# Patient Record
Sex: Female | Born: 1935
Health system: Southern US, Community
[De-identification: ages and names within clinical notes are randomized; demographics above are authoritative.]

## PROBLEM LIST (undated history)

## (undated) DIAGNOSIS — K625 Hemorrhage of anus and rectum: Secondary | ICD-10-CM

## (undated) DIAGNOSIS — M1990 Unspecified osteoarthritis, unspecified site: Secondary | ICD-10-CM

## (undated) DIAGNOSIS — I739 Peripheral vascular disease, unspecified: Secondary | ICD-10-CM

## (undated) DIAGNOSIS — I080 Rheumatic disorders of both mitral and aortic valves: Secondary | ICD-10-CM

## (undated) DIAGNOSIS — K5731 Diverticulosis of large intestine without perforation or abscess with bleeding: Secondary | ICD-10-CM

## (undated) DIAGNOSIS — I214 Non-ST elevation (NSTEMI) myocardial infarction: Secondary | ICD-10-CM

## (undated) DIAGNOSIS — I5031 Acute diastolic (congestive) heart failure: Secondary | ICD-10-CM

## (undated) DIAGNOSIS — H3530 Unspecified macular degeneration: Secondary | ICD-10-CM

## (undated) DIAGNOSIS — I509 Heart failure, unspecified: Secondary | ICD-10-CM

## (undated) DIAGNOSIS — B029 Zoster without complications: Secondary | ICD-10-CM

## (undated) DIAGNOSIS — G459 Transient cerebral ischemic attack, unspecified: Secondary | ICD-10-CM

## (undated) DIAGNOSIS — N390 Urinary tract infection, site not specified: Secondary | ICD-10-CM

## (undated) DIAGNOSIS — R251 Tremor, unspecified: Secondary | ICD-10-CM

## (undated) DIAGNOSIS — I639 Cerebral infarction, unspecified: Secondary | ICD-10-CM

## (undated) DIAGNOSIS — F419 Anxiety disorder, unspecified: Secondary | ICD-10-CM

## (undated) DIAGNOSIS — I10 Essential (primary) hypertension: Secondary | ICD-10-CM

## (undated) DIAGNOSIS — I219 Acute myocardial infarction, unspecified: Secondary | ICD-10-CM

## (undated) DIAGNOSIS — N179 Acute kidney failure, unspecified: Secondary | ICD-10-CM

## (undated) DIAGNOSIS — E785 Hyperlipidemia, unspecified: Secondary | ICD-10-CM

## (undated) DIAGNOSIS — N189 Chronic kidney disease, unspecified: Secondary | ICD-10-CM

## (undated) DIAGNOSIS — E119 Type 2 diabetes mellitus without complications: Secondary | ICD-10-CM

## (undated) DIAGNOSIS — T7840XA Allergy, unspecified, initial encounter: Secondary | ICD-10-CM

## (undated) DIAGNOSIS — R011 Cardiac murmur, unspecified: Secondary | ICD-10-CM

## (undated) DIAGNOSIS — C801 Malignant (primary) neoplasm, unspecified: Secondary | ICD-10-CM

## (undated) DIAGNOSIS — I70229 Atherosclerosis of native arteries of extremities with rest pain, unspecified extremity: Secondary | ICD-10-CM

## (undated) HISTORY — PX: EYE SURGERY: SHX253

## (undated) HISTORY — DX: Acute kidney failure, unspecified: N18.9

## (undated) HISTORY — DX: Acute diastolic (congestive) heart failure: I50.31

## (undated) HISTORY — DX: Hyperlipidemia, unspecified: E78.5

## (undated) HISTORY — DX: Non-ST elevation (NSTEMI) myocardial infarction: I21.4

## (undated) HISTORY — PX: CATARACT EXTRACTION, BILATERAL: SHX1313

## (undated) HISTORY — DX: Heart failure, unspecified: I50.9

## (undated) HISTORY — DX: Acute kidney failure, unspecified: N17.9

## (undated) HISTORY — DX: Hemorrhage of anus and rectum: K62.5

## (undated) HISTORY — DX: Atherosclerosis of native arteries of extremities with rest pain, unspecified extremity: I70.229

## (undated) HISTORY — DX: Diverticulosis of large intestine without perforation or abscess with bleeding: K57.31

## (undated) HISTORY — DX: Unspecified osteoarthritis, unspecified site: M19.90

## (undated) HISTORY — PX: TUBAL LIGATION: SHX77

## (undated) HISTORY — DX: Chronic kidney disease, unspecified: N17.9

## (undated) HISTORY — DX: Tremor, unspecified: R25.1

---

## 1996-10-26 HISTORY — PX: CARDIAC CATHETERIZATION: SHX172

## 2003-10-27 LAB — HM COLONOSCOPY: HM Colonoscopy: NORMAL

## 2006-04-05 ENCOUNTER — Emergency Department: Payer: Self-pay | Admitting: Emergency Medicine

## 2008-04-14 ENCOUNTER — Ambulatory Visit: Payer: Self-pay | Admitting: Internal Medicine

## 2009-05-08 ENCOUNTER — Ambulatory Visit: Payer: Self-pay

## 2011-11-05 DIAGNOSIS — R42 Dizziness and giddiness: Secondary | ICD-10-CM | POA: Diagnosis not present

## 2011-11-05 DIAGNOSIS — I2510 Atherosclerotic heart disease of native coronary artery without angina pectoris: Secondary | ICD-10-CM | POA: Diagnosis not present

## 2011-11-05 DIAGNOSIS — I119 Hypertensive heart disease without heart failure: Secondary | ICD-10-CM | POA: Diagnosis not present

## 2011-11-05 DIAGNOSIS — E7800 Pure hypercholesterolemia, unspecified: Secondary | ICD-10-CM | POA: Diagnosis not present

## 2011-11-27 DIAGNOSIS — I10 Essential (primary) hypertension: Secondary | ICD-10-CM | POA: Diagnosis not present

## 2011-11-27 DIAGNOSIS — J40 Bronchitis, not specified as acute or chronic: Secondary | ICD-10-CM | POA: Diagnosis not present

## 2011-11-27 DIAGNOSIS — E119 Type 2 diabetes mellitus without complications: Secondary | ICD-10-CM | POA: Diagnosis not present

## 2012-01-01 DIAGNOSIS — E785 Hyperlipidemia, unspecified: Secondary | ICD-10-CM | POA: Diagnosis not present

## 2012-01-01 DIAGNOSIS — I10 Essential (primary) hypertension: Secondary | ICD-10-CM | POA: Diagnosis not present

## 2012-01-01 DIAGNOSIS — Z79899 Other long term (current) drug therapy: Secondary | ICD-10-CM | POA: Diagnosis not present

## 2012-01-01 DIAGNOSIS — E119 Type 2 diabetes mellitus without complications: Secondary | ICD-10-CM | POA: Diagnosis not present

## 2012-02-24 DIAGNOSIS — E119 Type 2 diabetes mellitus without complications: Secondary | ICD-10-CM | POA: Diagnosis not present

## 2012-02-24 DIAGNOSIS — D2370 Other benign neoplasm of skin of unspecified lower limb, including hip: Secondary | ICD-10-CM | POA: Diagnosis not present

## 2012-02-24 DIAGNOSIS — M79609 Pain in unspecified limb: Secondary | ICD-10-CM | POA: Diagnosis not present

## 2012-02-24 DIAGNOSIS — M2010 Hallux valgus (acquired), unspecified foot: Secondary | ICD-10-CM | POA: Diagnosis not present

## 2012-02-25 DIAGNOSIS — I359 Nonrheumatic aortic valve disorder, unspecified: Secondary | ICD-10-CM | POA: Diagnosis not present

## 2012-02-25 DIAGNOSIS — E119 Type 2 diabetes mellitus without complications: Secondary | ICD-10-CM | POA: Diagnosis not present

## 2012-02-25 DIAGNOSIS — R011 Cardiac murmur, unspecified: Secondary | ICD-10-CM | POA: Diagnosis not present

## 2012-03-03 DIAGNOSIS — I119 Hypertensive heart disease without heart failure: Secondary | ICD-10-CM | POA: Diagnosis not present

## 2012-03-03 DIAGNOSIS — I2510 Atherosclerotic heart disease of native coronary artery without angina pectoris: Secondary | ICD-10-CM | POA: Diagnosis not present

## 2012-03-03 DIAGNOSIS — E7800 Pure hypercholesterolemia, unspecified: Secondary | ICD-10-CM | POA: Diagnosis not present

## 2012-03-07 DIAGNOSIS — D2370 Other benign neoplasm of skin of unspecified lower limb, including hip: Secondary | ICD-10-CM | POA: Diagnosis not present

## 2012-03-07 DIAGNOSIS — M79609 Pain in unspecified limb: Secondary | ICD-10-CM | POA: Diagnosis not present

## 2012-03-23 DIAGNOSIS — E11319 Type 2 diabetes mellitus with unspecified diabetic retinopathy without macular edema: Secondary | ICD-10-CM | POA: Diagnosis not present

## 2012-03-23 DIAGNOSIS — H26499 Other secondary cataract, unspecified eye: Secondary | ICD-10-CM | POA: Diagnosis not present

## 2012-03-31 DIAGNOSIS — H26499 Other secondary cataract, unspecified eye: Secondary | ICD-10-CM | POA: Diagnosis not present

## 2012-04-20 DIAGNOSIS — A779 Spotted fever, unspecified: Secondary | ICD-10-CM | POA: Diagnosis not present

## 2012-04-20 DIAGNOSIS — E119 Type 2 diabetes mellitus without complications: Secondary | ICD-10-CM | POA: Diagnosis not present

## 2012-06-01 DIAGNOSIS — Z01419 Encounter for gynecological examination (general) (routine) without abnormal findings: Secondary | ICD-10-CM | POA: Diagnosis not present

## 2012-06-01 DIAGNOSIS — E119 Type 2 diabetes mellitus without complications: Secondary | ICD-10-CM | POA: Diagnosis not present

## 2012-06-01 DIAGNOSIS — I739 Peripheral vascular disease, unspecified: Secondary | ICD-10-CM | POA: Diagnosis not present

## 2012-06-13 DIAGNOSIS — I6529 Occlusion and stenosis of unspecified carotid artery: Secondary | ICD-10-CM | POA: Diagnosis not present

## 2012-06-13 DIAGNOSIS — I2510 Atherosclerotic heart disease of native coronary artery without angina pectoris: Secondary | ICD-10-CM | POA: Diagnosis not present

## 2012-06-13 DIAGNOSIS — I70219 Atherosclerosis of native arteries of extremities with intermittent claudication, unspecified extremity: Secondary | ICD-10-CM | POA: Diagnosis not present

## 2012-06-13 DIAGNOSIS — I10 Essential (primary) hypertension: Secondary | ICD-10-CM | POA: Diagnosis not present

## 2012-06-15 DIAGNOSIS — R0989 Other specified symptoms and signs involving the circulatory and respiratory systems: Secondary | ICD-10-CM | POA: Diagnosis not present

## 2012-06-15 DIAGNOSIS — I70219 Atherosclerosis of native arteries of extremities with intermittent claudication, unspecified extremity: Secondary | ICD-10-CM | POA: Diagnosis not present

## 2012-06-15 DIAGNOSIS — I10 Essential (primary) hypertension: Secondary | ICD-10-CM | POA: Diagnosis not present

## 2012-06-16 DIAGNOSIS — I6529 Occlusion and stenosis of unspecified carotid artery: Secondary | ICD-10-CM | POA: Diagnosis not present

## 2012-06-21 ENCOUNTER — Ambulatory Visit: Payer: Self-pay | Admitting: Vascular Surgery

## 2012-06-21 DIAGNOSIS — I70229 Atherosclerosis of native arteries of extremities with rest pain, unspecified extremity: Secondary | ICD-10-CM | POA: Diagnosis not present

## 2012-06-21 DIAGNOSIS — Z7982 Long term (current) use of aspirin: Secondary | ICD-10-CM | POA: Diagnosis not present

## 2012-06-21 DIAGNOSIS — I10 Essential (primary) hypertension: Secondary | ICD-10-CM | POA: Diagnosis not present

## 2012-06-21 DIAGNOSIS — Z79899 Other long term (current) drug therapy: Secondary | ICD-10-CM | POA: Diagnosis not present

## 2012-06-21 DIAGNOSIS — I6529 Occlusion and stenosis of unspecified carotid artery: Secondary | ICD-10-CM | POA: Diagnosis not present

## 2012-06-21 DIAGNOSIS — E119 Type 2 diabetes mellitus without complications: Secondary | ICD-10-CM | POA: Diagnosis not present

## 2012-06-21 DIAGNOSIS — E785 Hyperlipidemia, unspecified: Secondary | ICD-10-CM | POA: Diagnosis not present

## 2012-06-21 DIAGNOSIS — I2510 Atherosclerotic heart disease of native coronary artery without angina pectoris: Secondary | ICD-10-CM | POA: Diagnosis not present

## 2012-06-21 LAB — BASIC METABOLIC PANEL
Anion Gap: 7 (ref 7–16)
BUN: 19 mg/dL — ABNORMAL HIGH (ref 7–18)
Chloride: 107 mmol/L (ref 98–107)
Creatinine: 0.92 mg/dL (ref 0.60–1.30)
Osmolality: 288 (ref 275–301)

## 2012-06-24 DIAGNOSIS — M79609 Pain in unspecified limb: Secondary | ICD-10-CM | POA: Diagnosis not present

## 2012-06-24 DIAGNOSIS — I70219 Atherosclerosis of native arteries of extremities with intermittent claudication, unspecified extremity: Secondary | ICD-10-CM | POA: Diagnosis not present

## 2012-07-07 DIAGNOSIS — E785 Hyperlipidemia, unspecified: Secondary | ICD-10-CM | POA: Diagnosis not present

## 2012-07-07 DIAGNOSIS — I10 Essential (primary) hypertension: Secondary | ICD-10-CM | POA: Diagnosis not present

## 2012-07-07 DIAGNOSIS — I739 Peripheral vascular disease, unspecified: Secondary | ICD-10-CM | POA: Diagnosis not present

## 2012-07-07 DIAGNOSIS — I2510 Atherosclerotic heart disease of native coronary artery without angina pectoris: Secondary | ICD-10-CM | POA: Diagnosis not present

## 2012-07-18 DIAGNOSIS — I10 Essential (primary) hypertension: Secondary | ICD-10-CM | POA: Diagnosis not present

## 2012-07-18 DIAGNOSIS — I70229 Atherosclerosis of native arteries of extremities with rest pain, unspecified extremity: Secondary | ICD-10-CM | POA: Diagnosis not present

## 2012-07-18 DIAGNOSIS — E785 Hyperlipidemia, unspecified: Secondary | ICD-10-CM | POA: Diagnosis not present

## 2012-07-18 DIAGNOSIS — M79609 Pain in unspecified limb: Secondary | ICD-10-CM | POA: Diagnosis not present

## 2012-07-20 DIAGNOSIS — I2510 Atherosclerotic heart disease of native coronary artery without angina pectoris: Secondary | ICD-10-CM | POA: Diagnosis not present

## 2012-07-20 DIAGNOSIS — I119 Hypertensive heart disease without heart failure: Secondary | ICD-10-CM | POA: Diagnosis not present

## 2012-07-20 DIAGNOSIS — I739 Peripheral vascular disease, unspecified: Secondary | ICD-10-CM | POA: Diagnosis not present

## 2012-07-27 DIAGNOSIS — E119 Type 2 diabetes mellitus without complications: Secondary | ICD-10-CM | POA: Diagnosis not present

## 2012-07-27 DIAGNOSIS — Z79899 Other long term (current) drug therapy: Secondary | ICD-10-CM | POA: Diagnosis not present

## 2012-08-02 DIAGNOSIS — Z01419 Encounter for gynecological examination (general) (routine) without abnormal findings: Secondary | ICD-10-CM | POA: Diagnosis not present

## 2012-08-02 DIAGNOSIS — Z1231 Encounter for screening mammogram for malignant neoplasm of breast: Secondary | ICD-10-CM | POA: Diagnosis not present

## 2012-08-12 DIAGNOSIS — Z86010 Personal history of colonic polyps: Secondary | ICD-10-CM | POA: Diagnosis not present

## 2012-08-12 DIAGNOSIS — R198 Other specified symptoms and signs involving the digestive system and abdomen: Secondary | ICD-10-CM | POA: Diagnosis not present

## 2012-09-06 ENCOUNTER — Ambulatory Visit: Payer: Self-pay | Admitting: Vascular Surgery

## 2012-09-06 DIAGNOSIS — I70209 Unspecified atherosclerosis of native arteries of extremities, unspecified extremity: Secondary | ICD-10-CM | POA: Diagnosis not present

## 2012-09-06 DIAGNOSIS — Z01812 Encounter for preprocedural laboratory examination: Secondary | ICD-10-CM | POA: Diagnosis not present

## 2012-09-06 LAB — BASIC METABOLIC PANEL
BUN: 16 mg/dL (ref 7–18)
Calcium, Total: 9.2 mg/dL (ref 8.5–10.1)
Chloride: 106 mmol/L (ref 98–107)
EGFR (African American): 59 — ABNORMAL LOW
Glucose: 129 mg/dL — ABNORMAL HIGH (ref 65–99)
Potassium: 4.4 mmol/L (ref 3.5–5.1)
Sodium: 139 mmol/L (ref 136–145)

## 2012-09-06 LAB — CBC
HCT: 36.3 % (ref 35.0–47.0)
HGB: 12.4 g/dL (ref 12.0–16.0)
MCHC: 34.2 g/dL (ref 32.0–36.0)
Platelet: 250 10*3/uL (ref 150–440)
RBC: 4.03 10*6/uL (ref 3.80–5.20)
WBC: 7.6 10*3/uL (ref 3.6–11.0)

## 2012-09-14 ENCOUNTER — Inpatient Hospital Stay: Payer: Self-pay | Admitting: Vascular Surgery

## 2012-09-14 DIAGNOSIS — I70209 Unspecified atherosclerosis of native arteries of extremities, unspecified extremity: Secondary | ICD-10-CM | POA: Diagnosis not present

## 2012-09-14 DIAGNOSIS — E785 Hyperlipidemia, unspecified: Secondary | ICD-10-CM | POA: Diagnosis present

## 2012-09-14 DIAGNOSIS — I739 Peripheral vascular disease, unspecified: Secondary | ICD-10-CM | POA: Diagnosis not present

## 2012-09-14 DIAGNOSIS — L97509 Non-pressure chronic ulcer of other part of unspecified foot with unspecified severity: Secondary | ICD-10-CM | POA: Diagnosis present

## 2012-09-14 DIAGNOSIS — E7800 Pure hypercholesterolemia, unspecified: Secondary | ICD-10-CM | POA: Diagnosis present

## 2012-09-14 DIAGNOSIS — L98499 Non-pressure chronic ulcer of skin of other sites with unspecified severity: Secondary | ICD-10-CM | POA: Diagnosis not present

## 2012-09-14 DIAGNOSIS — G609 Hereditary and idiopathic neuropathy, unspecified: Secondary | ICD-10-CM | POA: Diagnosis present

## 2012-09-14 DIAGNOSIS — I6529 Occlusion and stenosis of unspecified carotid artery: Secondary | ICD-10-CM | POA: Diagnosis present

## 2012-09-14 DIAGNOSIS — I10 Essential (primary) hypertension: Secondary | ICD-10-CM | POA: Diagnosis present

## 2012-09-14 DIAGNOSIS — Z794 Long term (current) use of insulin: Secondary | ICD-10-CM | POA: Diagnosis not present

## 2012-09-14 DIAGNOSIS — Z7982 Long term (current) use of aspirin: Secondary | ICD-10-CM | POA: Diagnosis not present

## 2012-09-14 DIAGNOSIS — I2510 Atherosclerotic heart disease of native coronary artery without angina pectoris: Secondary | ICD-10-CM | POA: Diagnosis present

## 2012-09-14 DIAGNOSIS — E119 Type 2 diabetes mellitus without complications: Secondary | ICD-10-CM | POA: Diagnosis present

## 2012-09-15 LAB — CBC WITH DIFFERENTIAL/PLATELET
Basophil %: 0.1 %
Eosinophil #: 0 10*3/uL (ref 0.0–0.7)
Eosinophil %: 0 %
HCT: 29.9 % — ABNORMAL LOW (ref 35.0–47.0)
HGB: 10.2 g/dL — ABNORMAL LOW (ref 12.0–16.0)
Lymphocyte %: 7.5 %
MCHC: 34.1 g/dL (ref 32.0–36.0)
Monocyte #: 0.5 x10 3/mm (ref 0.2–0.9)
Monocyte %: 3.7 %
Neutrophil #: 10.8 10*3/uL — ABNORMAL HIGH (ref 1.4–6.5)
Neutrophil %: 88.7 %
RBC: 3.33 10*6/uL — ABNORMAL LOW (ref 3.80–5.20)
WBC: 12.1 10*3/uL — ABNORMAL HIGH (ref 3.6–11.0)

## 2012-09-15 LAB — BASIC METABOLIC PANEL
Calcium, Total: 7.9 mg/dL — ABNORMAL LOW (ref 8.5–10.1)
EGFR (African American): >60
EGFR (Non-African Amer.): >60
Glucose: 194 mg/dL — ABNORMAL HIGH (ref 65–99)
Osmolality: 283 (ref 275–301)
Sodium: 139 mmol/L (ref 136–145)

## 2012-10-05 DIAGNOSIS — E119 Type 2 diabetes mellitus without complications: Secondary | ICD-10-CM | POA: Diagnosis not present

## 2012-10-12 DIAGNOSIS — I129 Hypertensive chronic kidney disease with stage 1 through stage 4 chronic kidney disease, or unspecified chronic kidney disease: Secondary | ICD-10-CM | POA: Diagnosis not present

## 2012-10-12 DIAGNOSIS — I70219 Atherosclerosis of native arteries of extremities with intermittent claudication, unspecified extremity: Secondary | ICD-10-CM | POA: Diagnosis not present

## 2012-10-12 DIAGNOSIS — E785 Hyperlipidemia, unspecified: Secondary | ICD-10-CM | POA: Diagnosis not present

## 2012-10-12 DIAGNOSIS — I6529 Occlusion and stenosis of unspecified carotid artery: Secondary | ICD-10-CM | POA: Diagnosis not present

## 2012-11-08 DIAGNOSIS — E7800 Pure hypercholesterolemia, unspecified: Secondary | ICD-10-CM | POA: Diagnosis not present

## 2012-11-08 DIAGNOSIS — I739 Peripheral vascular disease, unspecified: Secondary | ICD-10-CM | POA: Diagnosis not present

## 2012-11-08 DIAGNOSIS — I2510 Atherosclerotic heart disease of native coronary artery without angina pectoris: Secondary | ICD-10-CM | POA: Diagnosis not present

## 2012-11-16 DIAGNOSIS — E1039 Type 1 diabetes mellitus with other diabetic ophthalmic complication: Secondary | ICD-10-CM | POA: Diagnosis not present

## 2012-11-16 DIAGNOSIS — Z961 Presence of intraocular lens: Secondary | ICD-10-CM | POA: Diagnosis not present

## 2012-11-16 DIAGNOSIS — E11319 Type 2 diabetes mellitus with unspecified diabetic retinopathy without macular edema: Secondary | ICD-10-CM | POA: Diagnosis not present

## 2012-12-19 DIAGNOSIS — I2510 Atherosclerotic heart disease of native coronary artery without angina pectoris: Secondary | ICD-10-CM | POA: Diagnosis not present

## 2012-12-19 DIAGNOSIS — M79609 Pain in unspecified limb: Secondary | ICD-10-CM | POA: Diagnosis not present

## 2012-12-19 DIAGNOSIS — I6529 Occlusion and stenosis of unspecified carotid artery: Secondary | ICD-10-CM | POA: Diagnosis not present

## 2012-12-23 DIAGNOSIS — E119 Type 2 diabetes mellitus without complications: Secondary | ICD-10-CM | POA: Diagnosis not present

## 2012-12-23 DIAGNOSIS — I70219 Atherosclerosis of native arteries of extremities with intermittent claudication, unspecified extremity: Secondary | ICD-10-CM | POA: Diagnosis not present

## 2012-12-23 DIAGNOSIS — I10 Essential (primary) hypertension: Secondary | ICD-10-CM | POA: Diagnosis not present

## 2012-12-23 DIAGNOSIS — I6529 Occlusion and stenosis of unspecified carotid artery: Secondary | ICD-10-CM | POA: Diagnosis not present

## 2012-12-24 HISTORY — PX: PTCA: SHX146

## 2012-12-28 ENCOUNTER — Ambulatory Visit: Payer: Self-pay | Admitting: Vascular Surgery

## 2012-12-28 DIAGNOSIS — I6529 Occlusion and stenosis of unspecified carotid artery: Secondary | ICD-10-CM | POA: Diagnosis not present

## 2012-12-28 DIAGNOSIS — Z7982 Long term (current) use of aspirin: Secondary | ICD-10-CM | POA: Diagnosis not present

## 2012-12-28 DIAGNOSIS — E785 Hyperlipidemia, unspecified: Secondary | ICD-10-CM | POA: Diagnosis not present

## 2012-12-28 DIAGNOSIS — Z79899 Other long term (current) drug therapy: Secondary | ICD-10-CM | POA: Diagnosis not present

## 2012-12-28 DIAGNOSIS — I10 Essential (primary) hypertension: Secondary | ICD-10-CM | POA: Diagnosis not present

## 2012-12-28 DIAGNOSIS — M79609 Pain in unspecified limb: Secondary | ICD-10-CM | POA: Diagnosis not present

## 2012-12-28 DIAGNOSIS — E119 Type 2 diabetes mellitus without complications: Secondary | ICD-10-CM | POA: Diagnosis not present

## 2012-12-28 DIAGNOSIS — I70229 Atherosclerosis of native arteries of extremities with rest pain, unspecified extremity: Secondary | ICD-10-CM | POA: Diagnosis not present

## 2012-12-28 DIAGNOSIS — I2510 Atherosclerotic heart disease of native coronary artery without angina pectoris: Secondary | ICD-10-CM | POA: Diagnosis not present

## 2012-12-28 LAB — BASIC METABOLIC PANEL
BUN: 21 mg/dL — ABNORMAL HIGH (ref 7–18)
Calcium, Total: 8.9 mg/dL (ref 8.5–10.1)
Chloride: 107 mmol/L (ref 98–107)
Creatinine: 0.96 mg/dL (ref 0.60–1.30)
EGFR (Non-African Amer.): 57 — ABNORMAL LOW
Osmolality: 285 (ref 275–301)
Potassium: 4.5 mmol/L (ref 3.5–5.1)
Sodium: 138 mmol/L (ref 136–145)

## 2013-01-11 DIAGNOSIS — I739 Peripheral vascular disease, unspecified: Secondary | ICD-10-CM | POA: Diagnosis not present

## 2013-01-11 DIAGNOSIS — I2510 Atherosclerotic heart disease of native coronary artery without angina pectoris: Secondary | ICD-10-CM | POA: Diagnosis not present

## 2013-01-11 DIAGNOSIS — E785 Hyperlipidemia, unspecified: Secondary | ICD-10-CM | POA: Diagnosis not present

## 2013-01-11 DIAGNOSIS — I10 Essential (primary) hypertension: Secondary | ICD-10-CM | POA: Diagnosis not present

## 2013-01-18 DIAGNOSIS — E119 Type 2 diabetes mellitus without complications: Secondary | ICD-10-CM | POA: Diagnosis not present

## 2013-01-18 DIAGNOSIS — I6529 Occlusion and stenosis of unspecified carotid artery: Secondary | ICD-10-CM | POA: Diagnosis not present

## 2013-01-18 DIAGNOSIS — I70219 Atherosclerosis of native arteries of extremities with intermittent claudication, unspecified extremity: Secondary | ICD-10-CM | POA: Diagnosis not present

## 2013-01-18 DIAGNOSIS — I739 Peripheral vascular disease, unspecified: Secondary | ICD-10-CM | POA: Diagnosis not present

## 2013-01-23 DIAGNOSIS — E785 Hyperlipidemia, unspecified: Secondary | ICD-10-CM | POA: Diagnosis not present

## 2013-01-23 DIAGNOSIS — E1142 Type 2 diabetes mellitus with diabetic polyneuropathy: Secondary | ICD-10-CM | POA: Diagnosis not present

## 2013-01-23 DIAGNOSIS — E1139 Type 2 diabetes mellitus with other diabetic ophthalmic complication: Secondary | ICD-10-CM | POA: Diagnosis not present

## 2013-01-23 DIAGNOSIS — Z79899 Other long term (current) drug therapy: Secondary | ICD-10-CM | POA: Diagnosis not present

## 2013-01-23 DIAGNOSIS — E1149 Type 2 diabetes mellitus with other diabetic neurological complication: Secondary | ICD-10-CM | POA: Diagnosis not present

## 2013-02-03 DIAGNOSIS — E1149 Type 2 diabetes mellitus with other diabetic neurological complication: Secondary | ICD-10-CM | POA: Diagnosis not present

## 2013-02-03 DIAGNOSIS — E1142 Type 2 diabetes mellitus with diabetic polyneuropathy: Secondary | ICD-10-CM | POA: Diagnosis not present

## 2013-02-09 DIAGNOSIS — I739 Peripheral vascular disease, unspecified: Secondary | ICD-10-CM | POA: Diagnosis not present

## 2013-02-09 DIAGNOSIS — E7800 Pure hypercholesterolemia, unspecified: Secondary | ICD-10-CM | POA: Diagnosis not present

## 2013-02-09 DIAGNOSIS — I2510 Atherosclerotic heart disease of native coronary artery without angina pectoris: Secondary | ICD-10-CM | POA: Diagnosis not present

## 2013-02-09 DIAGNOSIS — I10 Essential (primary) hypertension: Secondary | ICD-10-CM | POA: Diagnosis not present

## 2013-04-05 DIAGNOSIS — I70219 Atherosclerosis of native arteries of extremities with intermittent claudication, unspecified extremity: Secondary | ICD-10-CM | POA: Diagnosis not present

## 2013-04-05 DIAGNOSIS — I10 Essential (primary) hypertension: Secondary | ICD-10-CM | POA: Diagnosis not present

## 2013-04-05 DIAGNOSIS — E119 Type 2 diabetes mellitus without complications: Secondary | ICD-10-CM | POA: Diagnosis not present

## 2013-04-05 DIAGNOSIS — I6529 Occlusion and stenosis of unspecified carotid artery: Secondary | ICD-10-CM | POA: Diagnosis not present

## 2013-04-10 DIAGNOSIS — I2510 Atherosclerotic heart disease of native coronary artery without angina pectoris: Secondary | ICD-10-CM | POA: Diagnosis not present

## 2013-04-10 DIAGNOSIS — I739 Peripheral vascular disease, unspecified: Secondary | ICD-10-CM | POA: Diagnosis not present

## 2013-04-10 DIAGNOSIS — I119 Hypertensive heart disease without heart failure: Secondary | ICD-10-CM | POA: Diagnosis not present

## 2013-04-10 DIAGNOSIS — E7800 Pure hypercholesterolemia, unspecified: Secondary | ICD-10-CM | POA: Diagnosis not present

## 2013-05-03 ENCOUNTER — Emergency Department: Payer: Self-pay | Admitting: Unknown Physician Specialty

## 2013-05-03 ENCOUNTER — Ambulatory Visit: Payer: Self-pay | Admitting: Family Medicine

## 2013-05-03 DIAGNOSIS — R079 Chest pain, unspecified: Secondary | ICD-10-CM | POA: Diagnosis not present

## 2013-05-03 DIAGNOSIS — I6529 Occlusion and stenosis of unspecified carotid artery: Secondary | ICD-10-CM | POA: Diagnosis not present

## 2013-05-03 DIAGNOSIS — Z79899 Other long term (current) drug therapy: Secondary | ICD-10-CM | POA: Diagnosis not present

## 2013-05-03 DIAGNOSIS — R6889 Other general symptoms and signs: Secondary | ICD-10-CM | POA: Diagnosis not present

## 2013-05-03 DIAGNOSIS — E119 Type 2 diabetes mellitus without complications: Secondary | ICD-10-CM | POA: Diagnosis not present

## 2013-05-03 DIAGNOSIS — I519 Heart disease, unspecified: Secondary | ICD-10-CM | POA: Diagnosis not present

## 2013-05-03 DIAGNOSIS — E785 Hyperlipidemia, unspecified: Secondary | ICD-10-CM | POA: Diagnosis not present

## 2013-05-03 DIAGNOSIS — I10 Essential (primary) hypertension: Secondary | ICD-10-CM | POA: Diagnosis not present

## 2013-05-03 LAB — COMPREHENSIVE METABOLIC PANEL
Albumin: 3.7 g/dL (ref 3.4–5.0)
Alkaline Phosphatase: 69 U/L (ref 50–136)
Anion Gap: 4 — ABNORMAL LOW (ref 7–16)
Chloride: 106 mmol/L (ref 98–107)
Co2: 26 mmol/L (ref 21–32)
Creatinine: 0.92 mg/dL (ref 0.60–1.30)
EGFR (African American): >60
EGFR (Non-African Amer.): >60
Glucose: 68 mg/dL (ref 65–99)
Osmolality: 271 (ref 275–301)
Potassium: 4.3 mmol/L (ref 3.5–5.1)
SGPT (ALT): 20 U/L (ref 12–78)
Sodium: 136 mmol/L (ref 136–145)
Total Protein: 6.7 g/dL (ref 6.4–8.2)

## 2013-05-03 LAB — APTT: Activated PTT: 31.3 secs (ref 23.6–35.9)

## 2013-05-03 LAB — CBC
HCT: 33.2 % — ABNORMAL LOW (ref 35.0–47.0)
HGB: 11.3 g/dL — ABNORMAL LOW (ref 12.0–16.0)
MCHC: 34.1 g/dL (ref 32.0–36.0)
MCV: 88 fL (ref 80–100)
Platelet: 231 10*3/uL (ref 150–440)
RBC: 3.78 10*6/uL — ABNORMAL LOW (ref 3.80–5.20)
RDW: 13.1 % (ref 11.5–14.5)

## 2013-05-03 LAB — PROTIME-INR
INR: 1
Prothrombin Time: 13.3 secs (ref 11.5–14.7)

## 2013-05-03 LAB — MAGNESIUM: Magnesium: 1.9 mg/dL

## 2013-05-03 LAB — CK TOTAL AND CKMB (NOT AT ARMC): CK, Total: 63 U/L (ref 21–215)

## 2013-05-29 DIAGNOSIS — I119 Hypertensive heart disease without heart failure: Secondary | ICD-10-CM | POA: Insufficient documentation

## 2013-05-29 DIAGNOSIS — I2510 Atherosclerotic heart disease of native coronary artery without angina pectoris: Secondary | ICD-10-CM | POA: Insufficient documentation

## 2013-06-06 DIAGNOSIS — E1142 Type 2 diabetes mellitus with diabetic polyneuropathy: Secondary | ICD-10-CM | POA: Diagnosis not present

## 2013-06-06 DIAGNOSIS — I10 Essential (primary) hypertension: Secondary | ICD-10-CM | POA: Diagnosis not present

## 2013-06-06 DIAGNOSIS — E785 Hyperlipidemia, unspecified: Secondary | ICD-10-CM | POA: Diagnosis not present

## 2013-06-06 DIAGNOSIS — E1149 Type 2 diabetes mellitus with other diabetic neurological complication: Secondary | ICD-10-CM | POA: Diagnosis not present

## 2013-06-07 DIAGNOSIS — E785 Hyperlipidemia, unspecified: Secondary | ICD-10-CM | POA: Diagnosis not present

## 2013-06-07 DIAGNOSIS — E1149 Type 2 diabetes mellitus with other diabetic neurological complication: Secondary | ICD-10-CM | POA: Diagnosis not present

## 2013-06-20 DIAGNOSIS — E785 Hyperlipidemia, unspecified: Secondary | ICD-10-CM | POA: Diagnosis not present

## 2013-06-20 DIAGNOSIS — I739 Peripheral vascular disease, unspecified: Secondary | ICD-10-CM | POA: Diagnosis not present

## 2013-06-20 DIAGNOSIS — I10 Essential (primary) hypertension: Secondary | ICD-10-CM | POA: Diagnosis not present

## 2013-06-20 DIAGNOSIS — I2510 Atherosclerotic heart disease of native coronary artery without angina pectoris: Secondary | ICD-10-CM | POA: Diagnosis not present

## 2013-07-24 DIAGNOSIS — I70219 Atherosclerosis of native arteries of extremities with intermittent claudication, unspecified extremity: Secondary | ICD-10-CM | POA: Diagnosis not present

## 2013-07-24 DIAGNOSIS — I10 Essential (primary) hypertension: Secondary | ICD-10-CM | POA: Diagnosis not present

## 2013-07-24 DIAGNOSIS — M1990 Unspecified osteoarthritis, unspecified site: Secondary | ICD-10-CM | POA: Diagnosis not present

## 2013-07-24 DIAGNOSIS — M79609 Pain in unspecified limb: Secondary | ICD-10-CM | POA: Diagnosis not present

## 2013-07-26 ENCOUNTER — Ambulatory Visit: Payer: Self-pay | Admitting: Vascular Surgery

## 2013-07-26 DIAGNOSIS — E785 Hyperlipidemia, unspecified: Secondary | ICD-10-CM | POA: Diagnosis not present

## 2013-07-26 DIAGNOSIS — Z9889 Other specified postprocedural states: Secondary | ICD-10-CM | POA: Diagnosis not present

## 2013-07-26 DIAGNOSIS — I2510 Atherosclerotic heart disease of native coronary artery without angina pectoris: Secondary | ICD-10-CM | POA: Diagnosis not present

## 2013-07-26 DIAGNOSIS — I10 Essential (primary) hypertension: Secondary | ICD-10-CM | POA: Diagnosis not present

## 2013-07-26 DIAGNOSIS — I6529 Occlusion and stenosis of unspecified carotid artery: Secondary | ICD-10-CM | POA: Diagnosis not present

## 2013-07-26 DIAGNOSIS — Z7982 Long term (current) use of aspirin: Secondary | ICD-10-CM | POA: Diagnosis not present

## 2013-07-26 DIAGNOSIS — Z79899 Other long term (current) drug therapy: Secondary | ICD-10-CM | POA: Diagnosis not present

## 2013-07-26 DIAGNOSIS — E119 Type 2 diabetes mellitus without complications: Secondary | ICD-10-CM | POA: Diagnosis not present

## 2013-07-26 DIAGNOSIS — Z794 Long term (current) use of insulin: Secondary | ICD-10-CM | POA: Diagnosis not present

## 2013-07-26 DIAGNOSIS — I70229 Atherosclerosis of native arteries of extremities with rest pain, unspecified extremity: Secondary | ICD-10-CM | POA: Diagnosis not present

## 2013-07-26 LAB — BASIC METABOLIC PANEL
Anion Gap: 5 — ABNORMAL LOW (ref 7–16)
Calcium, Total: 9.2 mg/dL (ref 8.5–10.1)
Co2: 26 mmol/L (ref 21–32)
EGFR (African American): 55 — ABNORMAL LOW
EGFR (Non-African Amer.): 47 — ABNORMAL LOW
Glucose: 225 mg/dL — ABNORMAL HIGH (ref 65–99)
Osmolality: 283 (ref 275–301)
Potassium: 4.7 mmol/L (ref 3.5–5.1)
Sodium: 137 mmol/L (ref 136–145)

## 2013-08-03 DIAGNOSIS — Z1231 Encounter for screening mammogram for malignant neoplasm of breast: Secondary | ICD-10-CM | POA: Diagnosis not present

## 2013-08-09 DIAGNOSIS — I2510 Atherosclerotic heart disease of native coronary artery without angina pectoris: Secondary | ICD-10-CM | POA: Diagnosis not present

## 2013-08-09 DIAGNOSIS — I70219 Atherosclerosis of native arteries of extremities with intermittent claudication, unspecified extremity: Secondary | ICD-10-CM | POA: Diagnosis not present

## 2013-08-09 DIAGNOSIS — E119 Type 2 diabetes mellitus without complications: Secondary | ICD-10-CM | POA: Diagnosis not present

## 2013-08-09 DIAGNOSIS — I6529 Occlusion and stenosis of unspecified carotid artery: Secondary | ICD-10-CM | POA: Diagnosis not present

## 2013-08-16 DIAGNOSIS — Z23 Encounter for immunization: Secondary | ICD-10-CM | POA: Diagnosis not present

## 2013-08-16 DIAGNOSIS — E785 Hyperlipidemia, unspecified: Secondary | ICD-10-CM | POA: Diagnosis not present

## 2013-08-16 DIAGNOSIS — I2510 Atherosclerotic heart disease of native coronary artery without angina pectoris: Secondary | ICD-10-CM | POA: Diagnosis not present

## 2013-08-16 DIAGNOSIS — Z01810 Encounter for preprocedural cardiovascular examination: Secondary | ICD-10-CM | POA: Diagnosis not present

## 2013-08-16 DIAGNOSIS — I10 Essential (primary) hypertension: Secondary | ICD-10-CM | POA: Diagnosis not present

## 2013-08-16 DIAGNOSIS — I739 Peripheral vascular disease, unspecified: Secondary | ICD-10-CM | POA: Diagnosis not present

## 2013-08-26 HISTORY — PX: PTCA: SHX146

## 2013-08-31 ENCOUNTER — Ambulatory Visit: Payer: Self-pay | Admitting: Vascular Surgery

## 2013-08-31 DIAGNOSIS — E119 Type 2 diabetes mellitus without complications: Secondary | ICD-10-CM | POA: Diagnosis not present

## 2013-08-31 DIAGNOSIS — Z7982 Long term (current) use of aspirin: Secondary | ICD-10-CM | POA: Diagnosis not present

## 2013-08-31 DIAGNOSIS — I70229 Atherosclerosis of native arteries of extremities with rest pain, unspecified extremity: Secondary | ICD-10-CM | POA: Diagnosis not present

## 2013-08-31 DIAGNOSIS — Z01812 Encounter for preprocedural laboratory examination: Secondary | ICD-10-CM | POA: Diagnosis not present

## 2013-08-31 DIAGNOSIS — Z79899 Other long term (current) drug therapy: Secondary | ICD-10-CM | POA: Diagnosis not present

## 2013-08-31 DIAGNOSIS — I10 Essential (primary) hypertension: Secondary | ICD-10-CM | POA: Diagnosis not present

## 2013-08-31 DIAGNOSIS — Z7902 Long term (current) use of antithrombotics/antiplatelets: Secondary | ICD-10-CM | POA: Diagnosis not present

## 2013-08-31 DIAGNOSIS — E785 Hyperlipidemia, unspecified: Secondary | ICD-10-CM | POA: Diagnosis not present

## 2013-08-31 LAB — BASIC METABOLIC PANEL
BUN: 17 mg/dL (ref 7–18)
Calcium, Total: 9.3 mg/dL (ref 8.5–10.1)
EGFR (Non-African Amer.): 53 — ABNORMAL LOW
Osmolality: 284 (ref 275–301)
Potassium: 4.8 mmol/L (ref 3.5–5.1)
Sodium: 134 mmol/L — ABNORMAL LOW (ref 136–145)

## 2013-08-31 LAB — CBC
MCH: 29.5 pg (ref 26.0–34.0)
MCHC: 33.6 g/dL (ref 32.0–36.0)
MCV: 88 fL (ref 80–100)
RDW: 12.7 % (ref 11.5–14.5)

## 2013-09-08 ENCOUNTER — Ambulatory Visit: Payer: Self-pay | Admitting: Vascular Surgery

## 2013-09-08 DIAGNOSIS — I70229 Atherosclerosis of native arteries of extremities with rest pain, unspecified extremity: Secondary | ICD-10-CM | POA: Diagnosis not present

## 2013-09-08 DIAGNOSIS — I2510 Atherosclerotic heart disease of native coronary artery without angina pectoris: Secondary | ICD-10-CM | POA: Diagnosis not present

## 2013-09-08 DIAGNOSIS — Z882 Allergy status to sulfonamides status: Secondary | ICD-10-CM | POA: Diagnosis not present

## 2013-09-08 DIAGNOSIS — Z881 Allergy status to other antibiotic agents status: Secondary | ICD-10-CM | POA: Diagnosis not present

## 2013-09-08 DIAGNOSIS — E119 Type 2 diabetes mellitus without complications: Secondary | ICD-10-CM | POA: Diagnosis not present

## 2013-09-08 DIAGNOSIS — I10 Essential (primary) hypertension: Secondary | ICD-10-CM | POA: Diagnosis not present

## 2013-09-08 DIAGNOSIS — Z7902 Long term (current) use of antithrombotics/antiplatelets: Secondary | ICD-10-CM | POA: Diagnosis not present

## 2013-09-08 DIAGNOSIS — Z7982 Long term (current) use of aspirin: Secondary | ICD-10-CM | POA: Diagnosis not present

## 2013-09-08 DIAGNOSIS — I659 Occlusion and stenosis of unspecified precerebral artery: Secondary | ICD-10-CM | POA: Diagnosis not present

## 2013-09-08 LAB — PLATELET COUNT: Platelet: 198 10*3/uL (ref 150–440)

## 2013-09-08 LAB — APTT: Activated PTT: 138 secs — ABNORMAL HIGH (ref 23.6–35.9)

## 2013-09-09 DIAGNOSIS — I2510 Atherosclerotic heart disease of native coronary artery without angina pectoris: Secondary | ICD-10-CM | POA: Diagnosis not present

## 2013-09-09 DIAGNOSIS — Z882 Allergy status to sulfonamides status: Secondary | ICD-10-CM | POA: Diagnosis not present

## 2013-09-09 DIAGNOSIS — Z881 Allergy status to other antibiotic agents status: Secondary | ICD-10-CM | POA: Diagnosis not present

## 2013-09-09 DIAGNOSIS — I10 Essential (primary) hypertension: Secondary | ICD-10-CM | POA: Diagnosis not present

## 2013-09-09 DIAGNOSIS — E119 Type 2 diabetes mellitus without complications: Secondary | ICD-10-CM | POA: Diagnosis not present

## 2013-09-09 DIAGNOSIS — I70229 Atherosclerosis of native arteries of extremities with rest pain, unspecified extremity: Secondary | ICD-10-CM | POA: Diagnosis not present

## 2013-09-09 LAB — BASIC METABOLIC PANEL
Anion Gap: 5 — ABNORMAL LOW (ref 7–16)
BUN: 11 mg/dL (ref 7–18)
Chloride: 111 mmol/L — ABNORMAL HIGH (ref 98–107)
Co2: 24 mmol/L (ref 21–32)
EGFR (Non-African Amer.): 55 — ABNORMAL LOW
Potassium: 4.3 mmol/L (ref 3.5–5.1)

## 2013-09-09 LAB — CBC WITH DIFFERENTIAL/PLATELET
Basophil #: 0.1 10*3/uL (ref 0.0–0.1)
Basophil %: 1 %
Eosinophil %: 3.2 %
HGB: 9.9 g/dL — ABNORMAL LOW (ref 12.0–16.0)
Lymphocyte #: 1.9 10*3/uL (ref 1.0–3.6)
MCH: 29.1 pg (ref 26.0–34.0)
MCV: 87 fL (ref 80–100)
Neutrophil #: 4.5 10*3/uL (ref 1.4–6.5)
Neutrophil %: 61.9 %
Platelet: 209 10*3/uL (ref 150–440)
RBC: 3.39 10*6/uL — ABNORMAL LOW (ref 3.80–5.20)
RDW: 12.8 % (ref 11.5–14.5)
WBC: 7.3 10*3/uL (ref 3.6–11.0)

## 2013-09-09 LAB — APTT: Activated PTT: 88 secs — ABNORMAL HIGH (ref 23.6–35.9)

## 2013-10-04 DIAGNOSIS — E1142 Type 2 diabetes mellitus with diabetic polyneuropathy: Secondary | ICD-10-CM | POA: Diagnosis not present

## 2013-10-04 DIAGNOSIS — S336XXA Sprain of sacroiliac joint, initial encounter: Secondary | ICD-10-CM | POA: Diagnosis not present

## 2013-10-04 DIAGNOSIS — E1149 Type 2 diabetes mellitus with other diabetic neurological complication: Secondary | ICD-10-CM | POA: Diagnosis not present

## 2013-10-05 DIAGNOSIS — I70219 Atherosclerosis of native arteries of extremities with intermittent claudication, unspecified extremity: Secondary | ICD-10-CM | POA: Diagnosis not present

## 2013-10-05 DIAGNOSIS — I739 Peripheral vascular disease, unspecified: Secondary | ICD-10-CM | POA: Diagnosis not present

## 2013-10-05 DIAGNOSIS — E119 Type 2 diabetes mellitus without complications: Secondary | ICD-10-CM | POA: Diagnosis not present

## 2013-10-05 DIAGNOSIS — I10 Essential (primary) hypertension: Secondary | ICD-10-CM | POA: Diagnosis not present

## 2013-10-26 LAB — HM DIABETES EYE EXAM

## 2013-11-02 DIAGNOSIS — E785 Hyperlipidemia, unspecified: Secondary | ICD-10-CM | POA: Diagnosis not present

## 2013-11-02 DIAGNOSIS — I10 Essential (primary) hypertension: Secondary | ICD-10-CM | POA: Diagnosis not present

## 2013-11-02 DIAGNOSIS — I2510 Atherosclerotic heart disease of native coronary artery without angina pectoris: Secondary | ICD-10-CM | POA: Diagnosis not present

## 2013-11-27 DIAGNOSIS — E11319 Type 2 diabetes mellitus with unspecified diabetic retinopathy without macular edema: Secondary | ICD-10-CM | POA: Diagnosis not present

## 2013-11-27 DIAGNOSIS — Z961 Presence of intraocular lens: Secondary | ICD-10-CM | POA: Diagnosis not present

## 2013-12-06 DIAGNOSIS — I10 Essential (primary) hypertension: Secondary | ICD-10-CM | POA: Diagnosis not present

## 2013-12-06 DIAGNOSIS — E1142 Type 2 diabetes mellitus with diabetic polyneuropathy: Secondary | ICD-10-CM | POA: Diagnosis not present

## 2013-12-06 DIAGNOSIS — E785 Hyperlipidemia, unspecified: Secondary | ICD-10-CM | POA: Diagnosis not present

## 2013-12-06 DIAGNOSIS — E1149 Type 2 diabetes mellitus with other diabetic neurological complication: Secondary | ICD-10-CM | POA: Diagnosis not present

## 2014-01-03 DIAGNOSIS — M79609 Pain in unspecified limb: Secondary | ICD-10-CM | POA: Diagnosis not present

## 2014-01-03 DIAGNOSIS — I70219 Atherosclerosis of native arteries of extremities with intermittent claudication, unspecified extremity: Secondary | ICD-10-CM | POA: Diagnosis not present

## 2014-01-03 DIAGNOSIS — E119 Type 2 diabetes mellitus without complications: Secondary | ICD-10-CM | POA: Diagnosis not present

## 2014-01-03 DIAGNOSIS — I6529 Occlusion and stenosis of unspecified carotid artery: Secondary | ICD-10-CM | POA: Diagnosis not present

## 2014-02-01 DIAGNOSIS — I739 Peripheral vascular disease, unspecified: Secondary | ICD-10-CM | POA: Diagnosis not present

## 2014-02-01 DIAGNOSIS — I2510 Atherosclerotic heart disease of native coronary artery without angina pectoris: Secondary | ICD-10-CM | POA: Diagnosis not present

## 2014-02-01 DIAGNOSIS — I10 Essential (primary) hypertension: Secondary | ICD-10-CM | POA: Diagnosis not present

## 2014-02-06 DIAGNOSIS — I10 Essential (primary) hypertension: Secondary | ICD-10-CM | POA: Diagnosis not present

## 2014-02-06 DIAGNOSIS — E1139 Type 2 diabetes mellitus with other diabetic ophthalmic complication: Secondary | ICD-10-CM | POA: Diagnosis not present

## 2014-02-06 DIAGNOSIS — E1149 Type 2 diabetes mellitus with other diabetic neurological complication: Secondary | ICD-10-CM | POA: Diagnosis not present

## 2014-02-06 DIAGNOSIS — E1142 Type 2 diabetes mellitus with diabetic polyneuropathy: Secondary | ICD-10-CM | POA: Diagnosis not present

## 2014-05-01 DIAGNOSIS — I10 Essential (primary) hypertension: Secondary | ICD-10-CM | POA: Diagnosis not present

## 2014-05-01 DIAGNOSIS — E785 Hyperlipidemia, unspecified: Secondary | ICD-10-CM | POA: Diagnosis not present

## 2014-05-01 DIAGNOSIS — I739 Peripheral vascular disease, unspecified: Secondary | ICD-10-CM | POA: Diagnosis not present

## 2014-05-01 DIAGNOSIS — I2510 Atherosclerotic heart disease of native coronary artery without angina pectoris: Secondary | ICD-10-CM | POA: Diagnosis not present

## 2014-05-08 DIAGNOSIS — E1149 Type 2 diabetes mellitus with other diabetic neurological complication: Secondary | ICD-10-CM | POA: Diagnosis not present

## 2014-05-08 DIAGNOSIS — E1142 Type 2 diabetes mellitus with diabetic polyneuropathy: Secondary | ICD-10-CM | POA: Diagnosis not present

## 2014-05-08 DIAGNOSIS — I739 Peripheral vascular disease, unspecified: Secondary | ICD-10-CM | POA: Diagnosis not present

## 2014-05-08 DIAGNOSIS — IMO0002 Reserved for concepts with insufficient information to code with codable children: Secondary | ICD-10-CM | POA: Diagnosis not present

## 2014-05-09 DIAGNOSIS — I70219 Atherosclerosis of native arteries of extremities with intermittent claudication, unspecified extremity: Secondary | ICD-10-CM | POA: Diagnosis not present

## 2014-05-09 DIAGNOSIS — M79609 Pain in unspecified limb: Secondary | ICD-10-CM | POA: Diagnosis not present

## 2014-05-28 DIAGNOSIS — T82598A Other mechanical complication of other cardiac and vascular devices and implants, initial encounter: Secondary | ICD-10-CM | POA: Diagnosis not present

## 2014-05-28 DIAGNOSIS — I70229 Atherosclerosis of native arteries of extremities with rest pain, unspecified extremity: Secondary | ICD-10-CM | POA: Diagnosis not present

## 2014-05-29 ENCOUNTER — Ambulatory Visit: Payer: Self-pay | Admitting: Vascular Surgery

## 2014-05-29 DIAGNOSIS — I2510 Atherosclerotic heart disease of native coronary artery without angina pectoris: Secondary | ICD-10-CM | POA: Diagnosis not present

## 2014-05-29 DIAGNOSIS — I10 Essential (primary) hypertension: Secondary | ICD-10-CM | POA: Diagnosis not present

## 2014-05-29 DIAGNOSIS — E119 Type 2 diabetes mellitus without complications: Secondary | ICD-10-CM | POA: Diagnosis not present

## 2014-05-29 DIAGNOSIS — I70229 Atherosclerosis of native arteries of extremities with rest pain, unspecified extremity: Secondary | ICD-10-CM | POA: Diagnosis not present

## 2014-05-29 LAB — BASIC METABOLIC PANEL
Anion Gap: 7 (ref 7–16)
BUN: 19 mg/dL — ABNORMAL HIGH (ref 7–18)
CREATININE: 1.1 mg/dL (ref 0.60–1.30)
Calcium, Total: 9.1 mg/dL (ref 8.5–10.1)
Chloride: 106 mmol/L (ref 98–107)
Co2: 25 mmol/L (ref 21–32)
EGFR (African American): 56 — ABNORMAL LOW
EGFR (Non-African Amer.): 48 — ABNORMAL LOW
GLUCOSE: 269 mg/dL — AB (ref 65–99)
Osmolality: 287 (ref 275–301)
Potassium: 5.1 mmol/L (ref 3.5–5.1)
SODIUM: 138 mmol/L (ref 136–145)

## 2014-06-26 LAB — HM MAMMOGRAPHY: HM Mammogram: NORMAL

## 2014-07-05 DIAGNOSIS — I10 Essential (primary) hypertension: Secondary | ICD-10-CM | POA: Diagnosis not present

## 2014-07-05 DIAGNOSIS — M79609 Pain in unspecified limb: Secondary | ICD-10-CM | POA: Diagnosis not present

## 2014-07-05 DIAGNOSIS — I70219 Atherosclerosis of native arteries of extremities with intermittent claudication, unspecified extremity: Secondary | ICD-10-CM | POA: Diagnosis not present

## 2014-07-05 DIAGNOSIS — M1990 Unspecified osteoarthritis, unspecified site: Secondary | ICD-10-CM | POA: Diagnosis not present

## 2014-08-08 DIAGNOSIS — Z5181 Encounter for therapeutic drug level monitoring: Secondary | ICD-10-CM | POA: Diagnosis not present

## 2014-08-08 DIAGNOSIS — I25118 Atherosclerotic heart disease of native coronary artery with other forms of angina pectoris: Secondary | ICD-10-CM | POA: Diagnosis not present

## 2014-08-08 DIAGNOSIS — I10 Essential (primary) hypertension: Secondary | ICD-10-CM | POA: Diagnosis not present

## 2014-08-08 DIAGNOSIS — Z23 Encounter for immunization: Secondary | ICD-10-CM | POA: Diagnosis not present

## 2014-08-08 DIAGNOSIS — E785 Hyperlipidemia, unspecified: Secondary | ICD-10-CM | POA: Diagnosis not present

## 2014-08-08 DIAGNOSIS — I739 Peripheral vascular disease, unspecified: Secondary | ICD-10-CM | POA: Diagnosis not present

## 2014-08-24 DIAGNOSIS — Z1231 Encounter for screening mammogram for malignant neoplasm of breast: Secondary | ICD-10-CM | POA: Diagnosis not present

## 2014-08-31 DIAGNOSIS — H04123 Dry eye syndrome of bilateral lacrimal glands: Secondary | ICD-10-CM | POA: Diagnosis not present

## 2014-09-10 DIAGNOSIS — I10 Essential (primary) hypertension: Secondary | ICD-10-CM | POA: Diagnosis not present

## 2014-09-10 DIAGNOSIS — Z23 Encounter for immunization: Secondary | ICD-10-CM | POA: Diagnosis not present

## 2014-09-10 DIAGNOSIS — E119 Type 2 diabetes mellitus without complications: Secondary | ICD-10-CM | POA: Diagnosis not present

## 2014-09-10 DIAGNOSIS — E1149 Type 2 diabetes mellitus with other diabetic neurological complication: Secondary | ICD-10-CM | POA: Diagnosis not present

## 2014-10-04 DIAGNOSIS — I6529 Occlusion and stenosis of unspecified carotid artery: Secondary | ICD-10-CM | POA: Diagnosis not present

## 2014-10-04 DIAGNOSIS — E119 Type 2 diabetes mellitus without complications: Secondary | ICD-10-CM | POA: Diagnosis not present

## 2014-10-04 DIAGNOSIS — I739 Peripheral vascular disease, unspecified: Secondary | ICD-10-CM | POA: Diagnosis not present

## 2014-10-04 DIAGNOSIS — R0989 Other specified symptoms and signs involving the circulatory and respiratory systems: Secondary | ICD-10-CM | POA: Diagnosis not present

## 2014-11-08 DIAGNOSIS — E785 Hyperlipidemia, unspecified: Secondary | ICD-10-CM | POA: Diagnosis not present

## 2014-11-08 DIAGNOSIS — I739 Peripheral vascular disease, unspecified: Secondary | ICD-10-CM | POA: Diagnosis not present

## 2014-11-08 DIAGNOSIS — I10 Essential (primary) hypertension: Secondary | ICD-10-CM | POA: Diagnosis not present

## 2014-11-08 DIAGNOSIS — I25118 Atherosclerotic heart disease of native coronary artery with other forms of angina pectoris: Secondary | ICD-10-CM | POA: Diagnosis not present

## 2014-11-28 DIAGNOSIS — H04123 Dry eye syndrome of bilateral lacrimal glands: Secondary | ICD-10-CM | POA: Diagnosis not present

## 2014-11-28 DIAGNOSIS — E10349 Type 1 diabetes mellitus with severe nonproliferative diabetic retinopathy without macular edema: Secondary | ICD-10-CM | POA: Diagnosis not present

## 2014-11-28 DIAGNOSIS — Z961 Presence of intraocular lens: Secondary | ICD-10-CM | POA: Diagnosis not present

## 2015-01-09 DIAGNOSIS — E0842 Diabetes mellitus due to underlying condition with diabetic polyneuropathy: Secondary | ICD-10-CM | POA: Diagnosis not present

## 2015-01-09 DIAGNOSIS — I10 Essential (primary) hypertension: Secondary | ICD-10-CM | POA: Diagnosis not present

## 2015-01-09 DIAGNOSIS — I739 Peripheral vascular disease, unspecified: Secondary | ICD-10-CM | POA: Diagnosis not present

## 2015-01-09 DIAGNOSIS — M159 Polyosteoarthritis, unspecified: Secondary | ICD-10-CM | POA: Diagnosis not present

## 2015-01-09 DIAGNOSIS — E1165 Type 2 diabetes mellitus with hyperglycemia: Secondary | ICD-10-CM | POA: Diagnosis not present

## 2015-01-09 DIAGNOSIS — E785 Hyperlipidemia, unspecified: Secondary | ICD-10-CM | POA: Diagnosis not present

## 2015-01-09 DIAGNOSIS — E1149 Type 2 diabetes mellitus with other diabetic neurological complication: Secondary | ICD-10-CM | POA: Diagnosis not present

## 2015-01-15 DIAGNOSIS — E1165 Type 2 diabetes mellitus with hyperglycemia: Secondary | ICD-10-CM | POA: Diagnosis not present

## 2015-01-15 DIAGNOSIS — I10 Essential (primary) hypertension: Secondary | ICD-10-CM | POA: Diagnosis not present

## 2015-01-15 DIAGNOSIS — Z0000 Encounter for general adult medical examination without abnormal findings: Secondary | ICD-10-CM | POA: Diagnosis not present

## 2015-01-15 DIAGNOSIS — Z1239 Encounter for other screening for malignant neoplasm of breast: Secondary | ICD-10-CM | POA: Diagnosis not present

## 2015-01-15 DIAGNOSIS — E1149 Type 2 diabetes mellitus with other diabetic neurological complication: Secondary | ICD-10-CM | POA: Diagnosis not present

## 2015-01-15 DIAGNOSIS — E1142 Type 2 diabetes mellitus with diabetic polyneuropathy: Secondary | ICD-10-CM | POA: Diagnosis not present

## 2015-01-15 DIAGNOSIS — I739 Peripheral vascular disease, unspecified: Secondary | ICD-10-CM | POA: Diagnosis not present

## 2015-01-15 DIAGNOSIS — E785 Hyperlipidemia, unspecified: Secondary | ICD-10-CM | POA: Diagnosis not present

## 2015-02-07 DIAGNOSIS — E780 Pure hypercholesterolemia: Secondary | ICD-10-CM | POA: Diagnosis not present

## 2015-02-07 DIAGNOSIS — I10 Essential (primary) hypertension: Secondary | ICD-10-CM | POA: Diagnosis not present

## 2015-02-07 DIAGNOSIS — I25118 Atherosclerotic heart disease of native coronary artery with other forms of angina pectoris: Secondary | ICD-10-CM | POA: Diagnosis not present

## 2015-02-07 DIAGNOSIS — I739 Peripheral vascular disease, unspecified: Secondary | ICD-10-CM | POA: Diagnosis not present

## 2015-02-12 NOTE — Op Note (Signed)
[name], Jill Hudson MR#:  [id number] DATE OF BIRTH:  07/06/1935  DATE OF PROCEDURE:  09/14/2012  PREOPERATIVE DIAGNOSIS: Atherosclerotic occlusive disease bilateral lower extremities with ulcerations of the left foot.   POSTOPERATIVE DIAGNOSIS: Atherosclerotic occlusive disease bilateral lower extremities with ulcerations of the left foot.  PROCEDURES PERFORMED:  1. Common and superficial femoral endarterectomy with CorMatrix patch angioplasty.  2. Repair of arterial defect with Xenograft CorMatrix patch.   PROCEDURE PERFORMED BY: Katha Cabal, MD   FIRST ASSISTANT: Algernon Huxley, MD   ANESTHESIA: General by endotracheal intubation.   FLUIDS: Per anesthesia record.   ESTIMATED BLOOD LOSS: 100 mL.   SPECIMEN: Common femoral plaque to pathology for permanent section.   INDICATIONS: Jill Hudson is a 80 year old woman who presented to the office with increasing pain in the left lower extremity. Angiography demonstrated a string sign within the common femoral artery and patency of the superficial femoral and profunda femoris arteries. This is not a location that is ideal for intervention and, therefore, she was counseled to undergo surgical endarterectomy. The risks and benefits have been reviewed. All questions answered. The patient agrees to proceed.   PROCEDURE: The patient is taken to the operating room and placed in the supine position. After adequate general anesthesia is induced, appropriate invasive monitors placed, and appropriate time-out has been called, she is prepped and draped in a sterile fashion.   A linear incision is made in the left groin approximately one-third of the distance from the symphysis pubis to the iliac crest as the femoral pulse is not palpable. Dissection is carried down through the soft tissues ligating lymphatics and venous branches as they are encountered with 2-0 and 3-0 silk ties. Femoral sheath is identified. It is then opened. The femoral artery  is easily palpable. It is nonpulsatile and completely hardened.   Dissection is then carried in a proximal direction until the circumflex vessels are noted. These are looped laterally and medially with blue Silastic vessel loops and above this level where the artery is soft and pulse is palpable an orange silastic vessel loop is passed. This represents the distal external iliac artery. The dissection is then carried distally so that several centimeters of the superficial femoral artery are dissected. A small branch is ligated with 3-0 silk ties and an orange silastic vessel loop is passed around the superficial femoral. In a similar fashion, the profunda femoris is dissected down to its first major division. Silastic vessel loop is placed. 5000 units of heparin is given and allowed to circulate for five minutes.   After obtaining vascular control, the common femoral artery is opened with an 11 blade and extended with Potts scissors. Endarterectomy is then performed treating the common and superficial femoral with direct visualization. Profunda femoris is treated with the eversion technique. Back bleeding is noted from all distal vessels. Forward flow is excellent. Interrupted 6-0 Prolene sutures are used to tack the distal intimal edge within the superficial femoral artery. CorMatrix patch is rehydrated on the back table, beveled, and then applied to the arterial defect using running 6-0 Prolene. Flushing maneuvers are performed and flow is re-established first to the profunda femoris and then the superficial femoral artery. Doppler is then used to interrogate the repair and triphasic signals are noted distally in the profunda femoris as well as the superficial femoral.   The wound is then inspected for hemostasis. A single interrupted 6-0 Prolene suture is placed along the lateral suture line and subsequently 2 mL  of Evicyl is placed around the artery and the suture line. The wound is then closed in multiple  layers using several layers of 2-0, several layers of 3-0 followed by 4-0 Monocryl subcuticular and then Dermabond. The patient tolerated the procedure well. There were no immediate complications. Sponge and needle counts were correct. She was taken to the recovery area in excellent condition.   ____________________________ Katha Cabal, MD ggs:drc D: 09/14/2012 09:28:49 ET T: 09/14/2012 11:27:10 ET JOB#: [id number]  cc: Katha Cabal, MD, <Dictator>, [name], MD Katha Cabal MD ELECTRONICALLY SIGNED 10/12/2012 16:22

## 2015-02-12 NOTE — Discharge Summary (Signed)
[name], Jill Hudson MR#:  [id number] DATE OF BIRTH:  [birth year]  DATE OF ADMISSION:  09/14/2012 DATE OF DISCHARGE:  09/15/2012  ADMITTING DIAGNOSIS: Atherosclerotic occlusive disease bilateral lower extremities with rest pain and ulceration of the left lower extremity.   SECONDARY DIAGNOSES:  1. Diabetes. 2. Hypertension. 3. Hypercholesterolemia. 4. Neuropathy.   PROCEDURE PERFORMED: Left common femoral endarterectomy with CorMatrix patch angioplasty; date 09/14/2012.   CONSULTATIONS: None.   HISTORY: Ms. Scorza presented to the office with worsening atherosclerotic occlusive disease of the left lower extremity. Angiography demonstrated a string sign in common femoral artery not acceptable for angioplasty in this particular patient given her minimal comorbidities and therefore she was counseled to undergo surgical endarterectomy. The risks and benefits were reviewed. All questions answered. patient agreed to proceed.   HOSPITAL COURSE: On the day of admission she underwent successful left femoral endarterectomy with CorMatrix patch angioplasty. Postoperatively she has done well and had no complications. She is ambulating on her own. She is afebrile with stable vital signs. She has minimal pain.   She is fit for discharge postoperative day #1. She will be discharged to home. She will follow up in 1 to 2 weeks. She may shower in one more day. She is to continue her home medicines, Percocet is added p.r.n. for pain.   DIET: ADA carb controlled.   ACTIVITIES: No heavy lifting or driving until seen back in the office.   FOLLOW UP: As noted she will follow up in my office in 7 to 10 days.   ____________________________ Katha Cabal, MD ggs:cms D: 09/15/2012 12:43:40 ET T: 09/15/2012 14:02:09 ET JOB#: [id number]  cc: Katha Cabal, MD, <Dictator> [name], MD   Katha Cabal MD ELECTRONICALLY SIGNED 10/12/2012 16:22

## 2015-02-12 NOTE — Op Note (Signed)
[name], Jill Hudson MR#:  [id number] DATE OF BIRTH:  [birth year]  DATE OF PROCEDURE:  06/21/2012  PREOPERATIVE DIAGNOSIS: Atherosclerotic occlusive disease bilateral lower extremities with rest pain in the left lower extremity.   POSTOPERATIVE DIAGNOSIS: Atherosclerotic occlusive disease bilateral lower extremities with rest pain in the left lower extremity.   PROCEDURES PERFORMED:  1. Abdominal aortogram.  2. Bilateral lower extremity distal runoff.   PROCEDURE PERFORMED BY: Katha Cabal, MD   SEDATION: Versed 3 mg plus fentanyl 100 mcg administered IV. Continuous ECG, pulse oximetry, and cardiopulmonary monitoring was performed throughout the entire procedure by the interventional radiology nurse. Total sedation time was 45 minutes.   ACCESS: 5 French sheath, right common femoral artery.   CONTRAST USED: Isovue 70 mL.   FLUORO TIME: 0.7 minutes.   INDICATIONS: Jill Hudson is a 80 year old woman who presented to the office with increasing pain in her left lower extremity particularly at rest. Physical examination as well as noninvasive studies demonstrated severe atherosclerotic occlusive disease and the patient is undergoing angiography with possibility of intervention. Risks and benefits were reviewed. All questions answered. The patient agrees to proceed.   PROCEDURE: The patient is taken to the Special Procedures suite and placed in a supine position. After adequate sedation is achieved, both groins are prepped and draped in a sterile fashion. Ultrasound is placed in a sterile sleeve. Ultrasound is utilized secondary to lack of appropriate landmarks and to avoid vascular injury. Under direct ultrasound visualization, the common femoral artery on the right is identified. It is echolucent and pulsatile indicating patency. Image is recorded for the permanent record. With real-time ultrasound guidance, micropuncture needle is inserted into the anterior wall of the common femoral  artery, microwire followed by micro sheath, J-wire followed by 5 French sheath and 5 French pigtail catheter. Pigtail catheter is positioned at the level of T12 and imaging of the aorta is obtained. Pigtail catheter is then repositioned to above the bifurcation and bilateral oblique views of the pelvis are obtained. With the pigtail catheter maintained just above the bifurcation, the image intensifier is repositioned to AP and bilateral lower extremity distal runoff is obtained. After review of the images, pigtail catheter is removed over a wire and a Mynx device is deployed without difficulty. There are no immediate complications.   INTERPRETATION: The aorta, bilateral common iliacs, and external iliac arteries are patent without evidence of hemodynamically significant stenoses.   The right common femoral, profunda femoris, and superficial femoral artery are patent. Again diffuse disease is noted but it is not hemodynamically significant. There appears to be patency of the trifurcation. Distal runoff down to the ankle is poorly visualized.   On the left, the common femoral demonstrates a string sign greater than 95% stenosis throughout its course. The SFA and profunda femoris are patent and again there appears to be three-vessel runoff distally.   SUMMARY: Greater than 95%, string sign within the left common femoral accounting for the profound ischemia of the left lower extremity. This is in an area of the common femoral that does not respond well to intervention and, therefore, the patient will undergo discussion with me regarding femoral endarterectomy.   ____________________________ Katha Cabal, MD ggs:drc D: 06/22/2012 14:29:56 ET T: 06/22/2012 15:21:34 ET JOB#: [id number]  cc: Katha Cabal, MD, <Dictator> [name], MD Katha Cabal MD ELECTRONICALLY SIGNED 07/01/2012 16:24

## 2015-02-15 NOTE — Op Note (Signed)
[name], Jill Hudson MR#:  [id number] DATE OF BIRTH:  01/24/1935  DATE OF PROCEDURE:  09/08/2013  PREOPERATIVE DIAGNOSIS: Atherosclerotic occlusive disease, bilateral lower extremities with ischemic rest pain of the left lower extremity.   POSTOPERATIVE DIAGNOSIS: Atherosclerotic occlusive disease, bilateral lower extremities with ischemic rest pain of the left lower extremity.   PROCEDURE PERFORMED:  1.  Left lower extremity angiography, third order catheter placement.  2.  Percutaneous transluminal angioplasty to 6 mm, left common femoral artery using 6 mm Lutonix balloon.  3.  Percutaneous transluminal angioplasty and stent placement of the left common iliac artery using a 6 x 19 Omnilink stent.   SURGEON: Katha Cabal, M.D.   ANESTHESIA: General by LMA.   FLUIDS: Per anesthesia record.   ESTIMATED BLOOD LOSS: Minimal.   SPECIMEN: None.   FLUOROSCOPY TIME: 6.5 minutes.   CONTRAST USED: Isovue 60 mL.   INDICATIONS: Jill Hudson is a 80 year old woman, who presented with worsening pain in her left lower extremity. She has a history of left femoral endarterectomy as well as multiple interventions in the left SFA. Workup demonstrated a greater than 95% String sign within the common femoral as well as a 75% stenosis in the common iliac. Also noted was occlusion of the previously placed stents in the SFA. Given the common femoral lesion, the risks and benefits for redo endarterectomy were reviewed as well as the potential problems as well as the advantages of balloon angioplasty. All questions have been answered. It is elected to proceed to the OR in preparation for redo endarterectomy, but intervention will be performed initially and if adequate without evidence of dissection, then surgical revision would not be necessary at this time. As noted, risks and benefits were reviewed. All questions answered. The patient and family are in agreement with this plan and wish to proceed.    DESCRIPTION OF PROCEDURE: The patient is taken to the operating room and placed in the supine position. After adequate general anesthesia is induced, she is positioned supine and she is prepped from her umbilicus to her knees and then draped in a sterile fashion. Appropriate timeout is called.   Ultrasound is placed in a sterile sleeve. Ultrasound is utilized secondary to lack of appropriate landmarks and to avoid vascular injury. Under direct ultrasound visualization, the common femoral artery is identified. It is echolucent and pulsatile indicating patency. Image is recorded for the permanent record and under real-time visualization, a microneedle is inserted, microwire followed by microsheath, J-wire followed by a 5-French sheath. Rim catheter and stiff angled Glidewire are then advanced into the distal aorta. Hand injection of contrast is used to visualize the bifurcation and the rim catheter is used to cross the bifurcation with the stiff angled Glidewire. The catheter and wire are negotiated down to the level of the inguinal ligament where hand injection of contrast demonstrates the critically stenotic lesion. This is then subsequently crossed with the Glidewire. The catheter is then negotiated into the profunda where hand injection of contrast is used to demonstrate intraluminal placement and distal profunda imaging. Heparin 4000 units are given. A Magic torque wire is then advanced into the distal profunda through the catheter and subsequently the rim catheter and the 5-French sheath are removed and a 6-French high flex Ansel sheath is advanced up and over the bifurcation, positioned with its tip in the distal external iliac.   Initially a 5 x 4 balloon is used to pre-dilate the lesion within the common femoral. Inflation is to  12 atmospheres for 1 minute. Subsequently, a 6 x 6 Lutonix balloon is advanced across the lesion. It is inflated to 12 atmospheres and the inflation is held for 3 full  minutes. Balloon is then taken down and followup injection demonstrates an excellent result. There is less than 5% residual stenosis. There is a completely smooth contour and, what almost appears to be, a normal common femoral artery. There is no evidence of dissection or significant residual stenosis.   The Ansel sheath is then repositioned at the level of the aortic bifurcation. Magnified oblique images are obtained and the common iliac lesion is identified. A 6 x 19 Omnilink stent is then advanced across the lesion and inflated to 14 atmospheres. Inflation is held for 30 seconds. Followup angiography demonstrates a well opposed stent, which now matches the diameter of the common iliac quite nicely and resolution of the stenotic area.   The sheath is then exchanged for an 11 cm 6-French sheath in an oblique view of the right groin is obtained. Subsequently, a Mynx device is deployed. There are no immediate complications.   INTERPRETATIONS: The common femoral artery demonstrates the previously noted greater than 95% String sign. The SFA is occluded as previously documented, but the profunda is well collateralized and quite large. A lesion is noted in the common iliac as well and again appears to be at least 75% stenotic. The origin of the left external iliac does show some plaque, but this appears to be a 30% or less stenosis and not flow limiting.   Following angioplasty there is an excellent result in the common femoral. Following angioplasty and stent placement in the common iliac, there is resolution of this lesion as well.   SUMMARY: Successful revascularization of the left lower extremity with treatment of both common iliac and common femoral lesions.    ____________________________ Katha Cabal, MD ggs:aw D: 09/08/2013 10:07:07 ET T: 09/08/2013 10:33:48 ET JOB#: [id number]  cc: Katha Cabal, MD, <Dictator> [name], MD Katha Cabal MD ELECTRONICALLY SIGNED 10/02/2013  17:16

## 2015-02-15 NOTE — Op Note (Signed)
[name], Jill Hudson MR#:  [id number] DATE OF BIRTH:  [birth year]  DATE OF PROCEDURE:  07/26/2013  PREOPERATIVE DIAGNOSIS: Atherosclerotic occlusive disease of bilateral lower extremities, with rest pain in the left lower extremity.   POSTOPERATIVE DIAGNOSIS: Atherosclerotic occlusive disease of bilateral lower extremities, with rest pain of the right lower extremity.   PROCEDURES PERFORMED: 1.  Abdominal aortogram.  2.  Bilateral lower extremity distal runoff.   SURGEON: Procedure performed by Wyndell Cardiff  SEDATION: Versed 2 mg, plus fentanyl 50 mcg administered IV. Continuous ECG, pulse oximetry and cardiopulmonary monitoring was performed throughout the entire procedure by the interventional radiology nurse.   Total sedation time was 1 hour.   ACCESS: A 5-French sheath, right common femoral artery.   CONTRAST USED: Isovue 65 mL.   FLURO TIME: 1 minute.   INDICATIONS: Jill Hudson is a 80 year old woman who presents to the office sooner than previously scheduled with increasing rest pain symptoms of her left lower extremity. The risks and benefits for angiography and the hope for intervention were reviewed. All questions answered. The patient agrees to proceed.   PROCEDURE: The patient was taken to Special Procedures and placed in the supine position. After adequate sedation was achieved, both groins are prepped and draped in sterile fashion. Ultrasound was utilized. Right common femoral artery was identified. It was echolucent and pulsatile, indicating patency. An area above the visible posterior plaque was selected and an image was recorded. Access was obtained with a micropuncture needle, MicroWire, followed by a MicroSheath, J wire followed by 5-French sheath and 5-French pigtail catheter. The pigtail catheter was positioned at the level of T12, and an AP projection of the aorta is obtained. Pigtail catheter is repositioned to above the bifurcation and an RAO projection was obtained of  the pelvis. Detector was then repositioned AP, and distal runoff was obtained. Oblique view of the right groin was obtained and a StarClose device was deployed. There were no immediate complications.   INTERPRETATION: The aorta demonstrates diffuse disease. There is moderate stenosis in the mid-portion below the renals, perhaps 25%. At the aortoiliac bifurcation there are bilateral high-grade stenoses at the origin of the right common iliac, approximately 75%, and approximately  1 cm distal to the origin on the left, approximately 80% to 85%. There is a smooth, tapered area in the left external iliac, but this does not appear to be hemodynamically significant. Right external iliac is widely patent.   The right common femoral demonstrates diffuse disease, but does not appear to be hemodynamically significant, and fills the profunda femoris and SFA. The SFA demonstrates a 40% to 50% stenosis at its origin on the right. There is 3-vessel runoff to the foot, but there is very slow filling distally, and in fact the left side fills faster than the right at the level of the ankle in spite of the patency of the right SFA and the occlusion of the left SFA.   The left common femoral demonstrates a string sign, which is clearly the most pressing lesion. This is in the proximal half of the common femoral. Profunda femoris is patent and extensively collateralizes and reconstitutes the below-knee popliteal. SFA demonstrates a flush occlusion, and remains occluded throughout its entire course down to the level of the femoral condyles. There is 3-vessel runoff to the foot on the left, although the peroneal is quite small.   SUMMARY: 1.  String sign noted within the left common femoral, which is not amenable to intervention at this time.  Given the patient's younger age and overall health this would be more suitably treated with surgery.  2.  Bilateral common iliac disease which will require kissing stents, reconstructing  the aortoiliac bifurcation. This would certainly need to be done prior to or at the same time as treatment of the left common femoral lesion.  3.  Occlusion of the left superficial femoral artery, with reconstitution of the below-knee popliteal and 3-vessel runoff to the ankle. This would certainly, given the extensive length, be better-treated with surgical bypass.     ____________________________ Katha Cabal, MD ggs:dm D: 07/26/2013 09:33:54 ET T: 07/26/2013 09:54:47 ET JOB#: [id number]  cc: Katha Cabal, MD, <Dictator> [name], MD Katha Cabal MD ELECTRONICALLY SIGNED 07/28/2013 10:03

## 2015-02-15 NOTE — Op Note (Signed)
[name], Jill Hudson MR#:  [id number] DATE OF BIRTH:  [birth year]  DATE OF PROCEDURE:  12/28/2012  PREOPERATIVE DIAGNOSIS: Atherosclerotic occlusive disease, bilateral lower extremities, with rest pain of the left lower extremity.   POSTOPERATIVE DIAGNOSIS: Atherosclerotic occlusive disease, bilateral lower extremities, with rest pain of the left lower extremity.    PROCEDURES PERFORMED:  1. Abdominal aortogram.  2. Left lower extremity distal runoff, third order catheter placement.  3. Percutaneous transluminal angioplasty and stent placement, left external iliac artery.  4. Percutaneous transluminal angioplasty and stent placement, left superficial femoral artery.   SURGEON: Katha Cabal, MD.   SEDATION: Versed 3 mg plus fentanyl 100 mcg administered IV. Continuous ECG, pulse oximetry and cardiopulmonary monitoring is performed throughout the entire procedure by the interventional radiology nurse. Total sedation time was 1 hour and 30 minutes.   ACCESS: A 6-French sheath, right common femoral artery.   CONTRAST USED: Isovue 120 mL.   FLUOROSCOPY TIME: 13.6 minutes.   INDICATIONS: The patient is a 80 year old woman who presented to the office with worsening pain in her left lower extremity. In August, she had undergone a common femoral artery endarterectomy with CorMatrix patch angioplasty. Initially, her symptoms were tremendously improved, and she was ambulating without difficulty and her left lower extremity was pain-free. However, over the past several months, she has noticed increasing pain in her left lower extremity as well as difficulty with her ambulation. Noninvasive study demonstrated significant stenosis within the proximal common femoral area versus the distal external iliac artery. Noninvasive study also demonstrated significant narrowing within the superficial femoral artery. She is, therefore, undergoing angiography with the hope for intervention. The risks and  benefits were reviewed. All questions answered. The patient agrees to proceed.   DESCRIPTION OF PROCEDURE: The patient is taken to special procedures and placed in the supine position. After adequate sedation is achieved, the groins are prepped and draped in a sterile fashion. Ultrasound is placed in a sterile sleeve. Ultrasound is utilized secondary to lack of appropriate landmarks and to avoid vascular injury. Under direct ultrasound visualization, the common femoral artery on the right is identified. It is echolucent and pulsatile, indicating patency. Image is recorded, and puncture is made under direct visualization after 1% lidocaine has been infiltrated in the soft tissue. Microwire followed by microsheath, J-wire followed by a 5-French sheath and 5 French pigtail catheter. Pigtail catheter is positioned at the level of T12, and AP projection of the aorta is obtained. Pigtail catheter is repositioned to above the bifurcation, and an RAO projection of the pelvis is obtained. A stiff angled Glidewire and a C2 catheter, RIM and the pigtail catheter were also attempted, were utilized to cross the bifurcation. Catheter and wire were then negotiated down into the mid external, and RAO projection was obtained demonstrating the narrowing identified on ultrasound is actually proximal to the superficial epigastric vessels. This is just above the ilioinguinal ligament by approximately 1 cm and is, therefore, anatomically the distal external iliac artery. It appears to be a very focal high-grade 90% stenosis which appears most consistent with a clamp injury. Review of the images from August 2013 do not demonstrate any narrowing in this area at that time. Below this level, the area of endarterectomized common femoral artery is widely patent, and there is actually no evidence of any recurrent stenosis. Profunda femoris is widely patent as well. Superficial femoral artery is noted to be patent at its origin but throughout  its course from Hunter's canal more  proximally, there are multiple subtotal occlusions noted. From Hunter's canal distally, it appeared patent and the trifurcation appears patent.   Heparin 5000 units was given. Stiff angled Glidewire is reintroduced and negotiated into the SFA, and subsequently a high-flex Ansel catheter, a 6 French sheath is advanced up and over and positioned in the mid external. V18 wire and straight Glide catheter are then negotiated down into the tibial vessels. Imaging through the sheath is now obtained, and initially, the external iliac lesion is treated with a 5 mm balloon inflation. This demonstrates greater than 50% residual stenosis. However, the improvement allows for treatment more distally. A 5 x 20 balloon is then advanced across the SFA lesions. Several inflations were required. Inflations are to 14 atmospheres for 1 minute. Followup angiography demonstrates the proximal one-third of the SFA looks quite good but the midportion extending down to the area of Hunter's canal demonstrates flow-limiting dissections, and, therefore, a LifeStent 6 x 170 is deployed and postdilated using the 5 x 20 balloon. Followup imaging demonstrates an excellent result with complete resolution of the dissection and smooth flow of contrast through the SFA. Attention is then turned to the external iliac lesion with the high residual stenosis, and a 6 x 2.5 Viabahn stent is selected. The external iliac artery measured 5.9 mm in diameter. The stent is deployed and demonstrates an hourglass shape across the lesion. A 6 x 2 balloon is then advanced over the wire. However, upon trying to position the balloon, the stent is poorly visualized. Single shot fluoro demonstrates the stent has moved forward and is now sitting within the common femoral.   In order to retrieve the stent, the balloon is then advanced through the stent and inflated. It is then steadily pulled backwards, pulling the stent with it. The  stent on followup imaging is now engaged within the lesion on the distal margin but does not cross the lesion. The balloon is then deflated, advanced into the common femoral and this time inflated to 22 atmospheres for a maximal dimension of 6.4. It is then pulled backwards, engaging the stent and pulling or dragging the stent back across the lesion so that the lesion is now in the midportion of the stent, essentially where it was deployed originally and in very good position. The balloon is then deflated, repositioned into the stent and reinflated to 22 atmospheres for a maximal dimension of 6.4; therefore, fully expanding the stent and engaging the Nitinol struts externally into the wall of the artery at this level.   Followup angiography through the sheath now demonstrates complete resolution of the external iliac lesion, wide patency of the common femoral endarterectomy site, wide patency of the profunda femoris, wide patency of the superficial femoral flowing into the popliteal and 3-vessel runoff to the foot, although distally, the anterior tibial appears to have moderate to severe disease and there is moderate disease within the midportion of the posterior tibial.   The sheath is then pulled into the external iliac on the right, oblique view is obtained and a StarClose device deployed without difficulty. There are no further complications.   INTERPRETATION: As noted the aorta and common iliacs are widely patent bilaterally. The right external iliac artery is also widely patent. The left external iliac artery demonstrates a very focal stenosis just above the superficial epigastric vessels. This is only modestly improved with a 5 mm inflation and, therefore, as described above, a Viabahn stent was deployed after repositioning the stent. The stent is inflated  and overexpanded to 6.4 in order to fully engage the external struts into the wall of the artery. I suspect the lesion is a hyperplastic lesion and,  therefore, more prone to allowing the stent to slip, thus supporting that this was a clamp-related injury. The superficial femoral artery is treated with angioplasty and stent placement as described above as well.   SUMMARY: Successful salvage of left lower extremity arterial system for treatment of rest pain symptoms.    ____________________________ Katha Cabal, MD ggs:gb D: 12/28/2012 19:33:49 ET T: 12/28/2012 21:13:13 ET JOB#: [id number]  cc: Katha Cabal, MD, <Dictator> [name], MD Katha Cabal MD ELECTRONICALLY SIGNED 01/24/2013 17:19

## 2015-02-16 NOTE — Op Note (Signed)
[name], Jill Hudson MR#:  [id number] DATE OF BIRTH:  1935-01-02  DATE OF PROCEDURE:  05/29/2014  PREOPERATIVE DIAGNOSIS:  1.  Atherosclerotic occlusive disease, bilateral lower extremities, with rest pain in  left lower extremity.  2.  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   POSTOPERATIVE DIAGNOSIS:  1.  Atherosclerotic occlusive disease, bilateral lower extremities, with rest pain in  left lower extremity.  2.  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  PROCEDURES PERFORMED:  1.  Abdominal aortogram.  2.  Left lower extremity distal runoff, 3rd order catheter placement.  3.  Percutaneous transluminal angioplasty, left SFA with Lutonix balloons.  4.  Crosser atherectomy, left SFA.   SURGEON: Hortencia Pilar, M.D.   SEDATION: Versed 5 mg plus fentanyl 200 mcg administered IV. Continuous ECG, pulse oximetry and cardiopulmonary monitoring was performed throughout the entire procedure by the interventional radiology nurse. Total sedation time was 1 hour, 20 minutes.   ACCESS: A 7 French sheath, right common femoral artery.   FLUOROSCOPY TIME: 11.1 minutes.   CONTRAST USED: Isovue 80 mL.   INDICATIONS: Jill Hudson is a 80 year old woman who presented to the office with worsening pain in her left lower extremity. Noninvasive studies as well as physical examination demonstrated probable occlusion of the previously-treated SFA. Risks and benefits for angiography and reintervention were reviewed. All questions answered. The patient has agreed to proceed.   DESCRIPTION OF PROCEDURE: The patient is taken to the special procedure suite, placed in the supine position. After adequate sedation is achieved she is positioned supine. Both groins are prepped and draped in sterile fashion. Appropriate timeout is called.   Ultrasound is placed in a sterile sleeve and the common femoral artery on the  right is identified. It is echolucent and pulsatile indicating patency. Image is recorded for the permanent record. Lidocaine 1% is infiltrated in the soft tissues, and under real-time visualization, Microneedle is inserted into the anterior wall of the common femoral artery, Microwire followed by Micro-sheath, J-wire followed by a 5 French sheath and 5 French pigtail catheter.   The pigtail catheter is positioned at the level of T12 and AP projection of the aorta is obtained. Pigtail catheter is repositioned to above the bifurcation and an RAO projection of the pelvis is obtained. Using the pigtail catheter and subsequently a RIM catheter, the bifurcation is crossed with the stiff-angled Glidewire and the pigtail catheter is positioned in the distal external LAO projection of the femoral artery is obtained and subsequently the catheter and wire are negotiated into the SFA which demonstrates a string sign in its proximal one-third, the  middle one-third has been treated before with a stent and this is occluded. There is reconstitution at the level of the knee of the mid popliteal. There is 2-vessel runoff to the foot via the anterior tibial and posterior tibial. Peroneal is patent proximally, but appears quite small.   Heparin 4000 units are given. The stiff-angled Glidewire is reintroduced and a 7 [place] Ansell sheath is advanced up and over the bifurcation. Using the [facility] and catheter the wire is negotiated down the string sign to the occlusion in the previously-placed stents. A 0.014 wire is then introduced after the catheter is exchanged for a Sidekick catheter and subsequently the 14S Crosser atherectomy catheter is prepped on the field and then advanced through the stented segment. It is advanced down to the knee level where the wire is then advanced through  the Crosser catheter and a straight Slip catheter is advanced over the wire. Hand injection of contrast is utilized to demonstrate intraluminal  placement and patency of the distal runoff as noted above.   Beginning with a 4-mm Lutonix 15 cm in length balloon, serial inflation of the SFA and the proximal popliteal is performed at the knee level.  A 4-mm Lutonix is used and then several 5-mm Lutonix balloons are used to angioplasty the previously-placed stent, as well as the proximal portion up into the common femoral. All inflations for Lutonix balloons were between 12 and 14 atmospheres and for 3 minutes in duration. Followup angiography demonstrates there is now rapid flow through the SFA filling the popliteal and distal runoff is preserved.   The sheath is pulled into the right external. Oblique view is obtained and a StarClose device deployed successfully and there are no immediate complications.   INTERPRETATION: The abdominal aorta is opacified with a bolus injection of contrast. There is diffuse plaque but no hemodynamically significant stenoses noted. The common and external iliac arteries are also patent bilaterally. Internal iliacs are patent.   The left common femoral demonstrates approximately 50% stenosis in its midportion. Profunda femoris is small, but patent. SFA has a string sign in its proximal one-third; the mid one-third, which is the stented portion is occluded and there appears to be reconstitution of the mid popliteal at approximately the knee level. There is 2-vessel runoff to the foot as noted above. The Crosser catheter is then used to successfully cross the occlusion, and subsequently angioplasty is performed with Lutonix balloons successfully recanalizing the SFA throughout its entire course.   SUMMARY: Successful recanalization of the left lower extremity as described above.    ____________________________ Katha Cabal, MD ggs:lt D: 05/30/2014 12:27:27 ET T: 05/30/2014 13:22:54 ET JOB#: [id number]  cc: Katha Cabal, MD, <Dictator> Katha Cabal MD ELECTRONICALLY SIGNED 06/05/2014 17:16

## 2015-03-15 DIAGNOSIS — M79672 Pain in left foot: Secondary | ICD-10-CM | POA: Diagnosis not present

## 2015-04-08 DIAGNOSIS — I70213 Atherosclerosis of native arteries of extremities with intermittent claudication, bilateral legs: Secondary | ICD-10-CM | POA: Diagnosis not present

## 2015-04-08 DIAGNOSIS — I70219 Atherosclerosis of native arteries of extremities with intermittent claudication, unspecified extremity: Secondary | ICD-10-CM | POA: Diagnosis not present

## 2015-04-08 DIAGNOSIS — I6529 Occlusion and stenosis of unspecified carotid artery: Secondary | ICD-10-CM | POA: Diagnosis not present

## 2015-04-08 DIAGNOSIS — I10 Essential (primary) hypertension: Secondary | ICD-10-CM | POA: Diagnosis not present

## 2015-04-08 DIAGNOSIS — E785 Hyperlipidemia, unspecified: Secondary | ICD-10-CM | POA: Diagnosis not present

## 2015-04-11 DIAGNOSIS — E11359 Type 2 diabetes mellitus with proliferative diabetic retinopathy without macular edema: Secondary | ICD-10-CM | POA: Insufficient documentation

## 2015-04-11 DIAGNOSIS — M159 Polyosteoarthritis, unspecified: Secondary | ICD-10-CM | POA: Insufficient documentation

## 2015-04-11 DIAGNOSIS — E782 Mixed hyperlipidemia: Secondary | ICD-10-CM | POA: Insufficient documentation

## 2015-04-11 DIAGNOSIS — E1169 Type 2 diabetes mellitus with other specified complication: Secondary | ICD-10-CM | POA: Insufficient documentation

## 2015-04-11 DIAGNOSIS — E1142 Type 2 diabetes mellitus with diabetic polyneuropathy: Secondary | ICD-10-CM | POA: Insufficient documentation

## 2015-04-11 DIAGNOSIS — E113599 Type 2 diabetes mellitus with proliferative diabetic retinopathy without macular edema, unspecified eye: Secondary | ICD-10-CM | POA: Insufficient documentation

## 2015-04-11 DIAGNOSIS — E1165 Type 2 diabetes mellitus with hyperglycemia: Secondary | ICD-10-CM | POA: Insufficient documentation

## 2015-04-11 DIAGNOSIS — I771 Stricture of artery: Secondary | ICD-10-CM | POA: Insufficient documentation

## 2015-04-11 DIAGNOSIS — I10 Essential (primary) hypertension: Secondary | ICD-10-CM | POA: Insufficient documentation

## 2015-04-11 DIAGNOSIS — IMO0002 Reserved for concepts with insufficient information to code with codable children: Secondary | ICD-10-CM | POA: Insufficient documentation

## 2015-04-16 DIAGNOSIS — E1151 Type 2 diabetes mellitus with diabetic peripheral angiopathy without gangrene: Secondary | ICD-10-CM | POA: Diagnosis not present

## 2015-04-16 DIAGNOSIS — B351 Tinea unguium: Secondary | ICD-10-CM | POA: Diagnosis not present

## 2015-04-16 DIAGNOSIS — I739 Peripheral vascular disease, unspecified: Secondary | ICD-10-CM | POA: Diagnosis not present

## 2015-04-16 DIAGNOSIS — M7661 Achilles tendinitis, right leg: Secondary | ICD-10-CM | POA: Diagnosis not present

## 2015-05-13 DIAGNOSIS — I70219 Atherosclerosis of native arteries of extremities with intermittent claudication, unspecified extremity: Secondary | ICD-10-CM | POA: Diagnosis not present

## 2015-05-13 DIAGNOSIS — I70213 Atherosclerosis of native arteries of extremities with intermittent claudication, bilateral legs: Secondary | ICD-10-CM | POA: Diagnosis not present

## 2015-05-13 DIAGNOSIS — I6529 Occlusion and stenosis of unspecified carotid artery: Secondary | ICD-10-CM | POA: Diagnosis not present

## 2015-05-13 DIAGNOSIS — I2510 Atherosclerotic heart disease of native coronary artery without angina pectoris: Secondary | ICD-10-CM | POA: Diagnosis not present

## 2015-05-13 DIAGNOSIS — I10 Essential (primary) hypertension: Secondary | ICD-10-CM | POA: Diagnosis not present

## 2015-05-13 DIAGNOSIS — I70211 Atherosclerosis of native arteries of extremities with intermittent claudication, right leg: Secondary | ICD-10-CM | POA: Diagnosis not present

## 2015-05-13 DIAGNOSIS — E785 Hyperlipidemia, unspecified: Secondary | ICD-10-CM | POA: Diagnosis not present

## 2015-05-17 ENCOUNTER — Encounter: Payer: Self-pay | Admitting: Internal Medicine

## 2015-05-17 ENCOUNTER — Ambulatory Visit (INDEPENDENT_AMBULATORY_CARE_PROVIDER_SITE_OTHER): Payer: Medicare Other | Admitting: Internal Medicine

## 2015-05-17 VITALS — BP 108/52 | HR 60 | Ht 61.5 in | Wt 122.8 lb

## 2015-05-17 DIAGNOSIS — I10 Essential (primary) hypertension: Secondary | ICD-10-CM

## 2015-05-17 DIAGNOSIS — E1142 Type 2 diabetes mellitus with diabetic polyneuropathy: Secondary | ICD-10-CM

## 2015-05-17 DIAGNOSIS — E1342 Other specified diabetes mellitus with diabetic polyneuropathy: Secondary | ICD-10-CM | POA: Diagnosis not present

## 2015-05-17 DIAGNOSIS — G629 Polyneuropathy, unspecified: Secondary | ICD-10-CM

## 2015-05-17 DIAGNOSIS — E1165 Type 2 diabetes mellitus with hyperglycemia: Secondary | ICD-10-CM | POA: Diagnosis not present

## 2015-05-17 DIAGNOSIS — I739 Peripheral vascular disease, unspecified: Secondary | ICD-10-CM | POA: Insufficient documentation

## 2015-05-17 DIAGNOSIS — D485 Neoplasm of uncertain behavior of skin: Secondary | ICD-10-CM

## 2015-05-17 DIAGNOSIS — IMO0002 Reserved for concepts with insufficient information to code with codable children: Secondary | ICD-10-CM

## 2015-05-17 MED ORDER — SAXAGLIPTIN HCL 2.5 MG PO TABS: 2.5000 mg | ORAL_TABLET | Freq: Every day | ORAL | Status: DC

## 2015-05-17 NOTE — Progress Notes (Signed)
Date:  05/17/2015   Name:  Jill Hudson   DOB:  November 28, 1934   MRN:  [id number]   Chief Complaint: Diabetes Diabetes She presents for her follow-up diabetic visit. She has type 2 diabetes mellitus. Her disease course has been fluctuating. Pertinent negatives for hypoglycemia include no speech difficulty. Associated symptoms include fatigue. Pertinent negatives for diabetes include no chest pain and no polyuria. There are no hypoglycemic complications. Symptoms are stable. Risk factors for coronary artery disease include dyslipidemia. She is compliant with treatment most of the time (not tolerating tradjenta - headache and backache).  She continues on metformin, amaryl and tradjenta 2.5 mg.  She is willing to try another agent instead of tradjenta.  She has been on insulin in the past but it caused frequent hypoglycemia. PVD She is followed by [facility].  The stent on the left is partially blocked and may need to be revised.  However for now she will continue exercise and follow up in 6 months.  [facility] also monitors carotid arteries. CAD Has been followed by [facility].  Symptoms are stable - she continues on medical management without further intervention. Change in skin lesion She has noticed a mole on her right chest that is getting bigger.  Also a small mole with irregular border.  She has not had a skin exam in some time.  Review of Systems:  Review of Systems  Constitutional: Positive for fatigue. Negative for appetite change and unexpected weight change.  Respiratory: Negative for chest tightness and shortness of breath.   Cardiovascular: Negative for chest pain, palpitations and leg swelling.  Endocrine: Negative for polyuria.  Genitourinary: Negative for dysuria and urgency.  Musculoskeletal: Positive for back pain.  Neurological: Negative for syncope and speech difficulty.    Patient Active Problem List   Diagnosis Date Noted  . Peripheral blood vessel disorder 05/17/2015  . 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 Background retinopathy due to secondary diabetes 04/11/2015  . Diabetic peripheral neuropathy 04/11/2015  . Diabetes mellitus with polyneuropathy 04/11/2015  . HLD (hyperlipidemia) 04/11/2015  . Essential (primary) hypertension 04/11/2015  . Generalized OA 04/11/2015  . Arterial insufficiency 04/11/2015  . Diabetes mellitus type 2, uncontrolled 04/11/2015  . Arteriosclerosis of coronary artery 05/29/2013    Prior to Admission medications   Medication Sig Start Date End Date Taking? Authorizing Provider  aspirin 81 MG tablet Take 1 tablet by mouth daily.    Historical Provider, MD  clopidogrel (PLAVIX) 75 MG tablet Take 1 tablet by mouth daily.    Historical Provider, MD  diltiazem (CARTIA XT) 180 MG 24 hr capsule Take 1 tablet by mouth 2 (two) times daily.    Historical Provider, MD  enalapril (VASOTEC) 20 MG tablet Take 1 tablet by mouth daily.    Historical Provider, MD  glimepiride (AMARYL) 4 MG tablet Take 1 tablet by mouth 2 (two) times daily. 09/13/14   Historical Provider, MD  GLUCOSE BLOOD VI     Historical Provider, MD  metFORMIN (GLUCOPHAGE) 500 MG tablet Take 1 tablet by mouth 2 (two) times daily. 10/25/14   Historical Provider, MD  pravastatin (PRAVACHOL) 20 MG tablet Take 1 tablet by mouth daily.    Historical Provider, MD    Allergies  Allergen Reactions  . Codeine     Upset stomach  . Ezetimibe Other (See Comments)    myalgias  . Nitrofurantoin     Pruitus, rash  . Other Rash  . Sulfa Antibiotics Rash and Other (See  Comments)    Sore mouth     Past Surgical History  Procedure Laterality Date  . Ptca  08/2013    Left common iliac  . Ptca  12/2012    left ext iliac  . Cardiac catheterization  1998    40% LM, 95% Ramus interm    History  Substance Use Topics  . Smoking status: Former Research scientist (life sciences)  . Smokeless tobacco: Not on file  . Alcohol Use: 0.0 oz/week    0 Standard drinks or equivalent per week     Medication  list has been reviewed and updated.  Physical Examination:  Physical Exam  Constitutional: She is oriented to person, place, and time. She appears well-developed. No distress.  Eyes: Conjunctivae are normal. Right eye exhibits no discharge. Left eye exhibits no discharge. No scleral icterus.  Neck: Normal range of motion. Neck supple. No thyromegaly present.  Cardiovascular: Normal rate, regular rhythm and normal heart sounds.  Exam reveals decreased pulses.   Pulmonary/Chest: Effort normal and breath sounds normal. No respiratory distress. She has no wheezes. She has no rales.  Musculoskeletal: Normal range of motion. She exhibits no edema or tenderness.  Lymphadenopathy:    She has no cervical adenopathy.  Neurological: She is alert and oriented to person, place, and time.  Skin: Skin is warm and dry. No rash noted.     Psychiatric: She has a normal mood and affect. Her behavior is normal. Thought content normal.    BP 108/52 mmHg  Pulse 60  Ht 5' 1.5" (1.562 m)  Wt 122 lb 12.8 oz (55.702 kg)  BMI 22.83 kg/m2  Assessment and Plan: 1. Diabetes mellitus type 2, uncontrolled Stop tradjenta; continue other meds; add onglyza - Hemoglobin A1c - saxagliptin HCl (ONGLYZA) 2.5 MG TABS tablet; Take 1 tablet (2.5 mg total) by mouth daily.  Dispense: 42 tablet  2. Diabetic peripheral neuropathy stable  3. Essential (primary) hypertension controlled - Basic metabolic panel  4. Neoplasm of uncertain behavior of skin Refer to Dermatology - Dr. Verlee Monte, MD [facility]  05/17/2015

## 2015-05-18 LAB — HEMOGLOBIN A1C
Est. average glucose Bld gHb Est-mCnc: 186 mg/dL
Hgb A1c MFr Bld: 8.1 % — ABNORMAL HIGH (ref 4.8–5.6)

## 2015-05-18 LAB — BASIC METABOLIC PANEL
BUN/Creatinine Ratio: 21 (ref 11–26)
BUN: 26 mg/dL (ref 8–27)
CO2: 22 mmol/L (ref 18–29)
Calcium: 9.7 mg/dL (ref 8.7–10.3)
Chloride: 96 mmol/L — ABNORMAL LOW (ref 97–108)
Creatinine, Ser: 1.23 mg/dL — ABNORMAL HIGH (ref 0.57–1.00)
GFR, EST AFRICAN AMERICAN: 49 mL/min/{1.73_m2} — AB (ref >59)
GFR, EST NON AFRICAN AMERICAN: 42 mL/min/{1.73_m2} — AB (ref >59)
Glucose: 223 mg/dL — ABNORMAL HIGH (ref 65–99)
Potassium: 5.8 mmol/L — ABNORMAL HIGH (ref 3.5–5.2)
SODIUM: 135 mmol/L (ref 134–144)

## 2015-06-04 ENCOUNTER — Other Ambulatory Visit: Payer: Self-pay | Admitting: Internal Medicine

## 2015-06-11 DIAGNOSIS — I2510 Atherosclerotic heart disease of native coronary artery without angina pectoris: Secondary | ICD-10-CM | POA: Diagnosis not present

## 2015-06-11 DIAGNOSIS — I10 Essential (primary) hypertension: Secondary | ICD-10-CM | POA: Diagnosis not present

## 2015-06-11 DIAGNOSIS — I739 Peripheral vascular disease, unspecified: Secondary | ICD-10-CM | POA: Diagnosis not present

## 2015-06-11 DIAGNOSIS — E780 Pure hypercholesterolemia: Secondary | ICD-10-CM | POA: Diagnosis not present

## 2015-07-02 DIAGNOSIS — Z1283 Encounter for screening for malignant neoplasm of skin: Secondary | ICD-10-CM | POA: Diagnosis not present

## 2015-07-02 DIAGNOSIS — D2261 Melanocytic nevi of right upper limb, including shoulder: Secondary | ICD-10-CM | POA: Diagnosis not present

## 2015-07-02 DIAGNOSIS — L700 Acne vulgaris: Secondary | ICD-10-CM | POA: Diagnosis not present

## 2015-07-02 DIAGNOSIS — D485 Neoplasm of uncertain behavior of skin: Secondary | ICD-10-CM | POA: Diagnosis not present

## 2015-07-02 DIAGNOSIS — D2262 Melanocytic nevi of left upper limb, including shoulder: Secondary | ICD-10-CM | POA: Diagnosis not present

## 2015-07-02 DIAGNOSIS — L821 Other seborrheic keratosis: Secondary | ICD-10-CM | POA: Diagnosis not present

## 2015-07-24 ENCOUNTER — Ambulatory Visit (INDEPENDENT_AMBULATORY_CARE_PROVIDER_SITE_OTHER): Payer: Medicare Other | Admitting: Internal Medicine

## 2015-07-24 ENCOUNTER — Encounter: Payer: Self-pay | Admitting: Internal Medicine

## 2015-07-24 VITALS — BP 148/52 | HR 60 | Ht 61.5 in | Wt 124.0 lb

## 2015-07-24 DIAGNOSIS — K219 Gastro-esophageal reflux disease without esophagitis: Secondary | ICD-10-CM | POA: Diagnosis not present

## 2015-07-24 MED ORDER — PANTOPRAZOLE SODIUM 40 MG PO TBEC: 40.0000 mg | DELAYED_RELEASE_TABLET | Freq: Every day | ORAL | Status: DC

## 2015-07-24 NOTE — Patient Instructions (Signed)
Gastroesophageal Reflux Disease, Adult Gastroesophageal reflux disease (GERD) happens when acid from your stomach flows up into the esophagus. When acid comes in contact with the esophagus, the acid causes soreness (inflammation) in the esophagus. Over time, GERD may create small holes (ulcers) in the lining of the esophagus. CAUSES   Increased body weight. This puts pressure on the stomach, making acid rise from the stomach into the esophagus.  Smoking. This increases acid production in the stomach.  Drinking alcohol. This causes decreased pressure in the lower esophageal sphincter (valve or ring of muscle between the esophagus and stomach), allowing acid from the stomach into the esophagus.  Late evening meals and a full stomach. This increases pressure and acid production in the stomach.  A malformed lower esophageal sphincter. Sometimes, no cause is found. SYMPTOMS   Burning pain in the lower part of the mid-chest behind the breastbone and in the mid-stomach area. This may occur twice a week or more often.  Trouble swallowing.  Sore throat.  Dry cough.  Asthma-like symptoms including chest tightness, shortness of breath, or wheezing. DIAGNOSIS  Your caregiver may be able to diagnose GERD based on your symptoms. In some cases, X-rays and other tests may be done to check for complications or to check the condition of your stomach and esophagus. TREATMENT  Your caregiver may recommend over-the-counter or prescription medicines to help decrease acid production. Ask your caregiver before starting or adding any new medicines.  HOME CARE INSTRUCTIONS   Change the factors that you can control. Ask your caregiver for guidance concerning weight loss, quitting smoking, and alcohol consumption.  Avoid foods and drinks that make your symptoms worse, such as:  Caffeine or alcoholic drinks.  Chocolate.  Peppermint or mint flavorings.  Garlic and onions.  Spicy foods.  Citrus fruits,  such as oranges, lemons, or limes.  Tomato-based foods such as sauce, chili, salsa, and pizza.  Fried and fatty foods.  Avoid lying down for the 3 hours prior to your bedtime or prior to taking a nap.  Eat small, frequent meals instead of large meals.  Wear loose-fitting clothing. Do not wear anything tight around your waist that causes pressure on your stomach.  Raise the head of your bed 6 to 8 inches with wood blocks to help you sleep. Extra pillows will not help.  Only take over-the-counter or prescription medicines for pain, discomfort, or fever as directed by your caregiver.  Do not take aspirin, ibuprofen, or other nonsteroidal anti-inflammatory drugs (NSAIDs). SEEK IMMEDIATE MEDICAL CARE IF:   You have pain in your arms, neck, jaw, teeth, or back.  Your pain increases or changes in intensity or duration.  You develop nausea, vomiting, or sweating (diaphoresis).  You develop shortness of breath, or you faint.  Your vomit is green, yellow, black, or looks like coffee grounds or blood.  Your stool is red, bloody, or black. These symptoms could be signs of other problems, such as heart disease, gastric bleeding, or esophageal bleeding. MAKE SURE YOU:   Understand these instructions.  Will watch your condition.  Will get help right away if you are not doing well or get worse. Document Released: 07/22/2005 Document Revised: 01/04/2012 Document Reviewed: 05/01/2011 [employer] Patient Information 2015 [employer]. This information is not intended to replace advice given to you by your health care provider. Make sure you discuss any questions you have with your health care provider.  

## 2015-07-24 NOTE — Progress Notes (Signed)
Date:  07/24/2015   Name:  Jill Hudson   DOB:  07-09-1935   MRN:  [id number]   Chief Complaint: [name] Reflux  Gastrophageal Reflux She complains of heartburn and water brash. She reports no abdominal pain, no chest pain, no choking, no coughing or no nausea. This is a new problem. The current episode started 1 to 4 weeks ago. The problem occurs occasionally (3 episodes). The heartburn duration is more than one hour. The heartburn is located in the substernum. The heartburn wakes her from sleep. There are no known risk factors. She has tried an antacid for the symptoms.    Review of Systems:  Review of Systems  Respiratory: Negative for cough, choking and shortness of breath.   Cardiovascular: Negative for chest pain, palpitations and leg swelling.  Gastrointestinal: Positive for heartburn and constipation. Negative for nausea, vomiting, abdominal pain, blood in stool and abdominal distention.       Heartburn and reflux  Neurological: Positive for weakness ( Lower extr claudication left greater than right).    Patient Active Problem List   Diagnosis Date Noted  . Peripheral blood vessel disorder 05/17/2015  . Neoplasm of uncertain behavior of skin 05/17/2015  . Background retinopathy due to secondary diabetes 04/11/2015  . Diabetic peripheral neuropathy 04/11/2015  . Diabetes mellitus with polyneuropathy 04/11/2015  . HLD (hyperlipidemia) 04/11/2015  . Essential (primary) hypertension 04/11/2015  . Generalized OA 04/11/2015  . Arterial insufficiency 04/11/2015  . Diabetes mellitus type 2, uncontrolled 04/11/2015  . Arteriosclerosis of coronary artery 05/29/2013    Prior to Admission medications   Medication Sig Start Date End Date Taking? Authorizing Provider  aspirin 81 MG tablet Take 1 tablet by mouth daily.   Yes Historical Provider, MD  B Complex Vitamins (VITAMIN-B COMPLEX) TABS Take by mouth.   Yes Historical Provider, MD  Biotin 10 MG TABS Take by mouth.   Yes  Historical Provider, MD  clopidogrel (PLAVIX) 75 MG tablet Take 1 tablet by mouth daily.   Yes Historical Provider, MD  Cyanocobalamin (RA VITAMIN B-12 TR) 1000 MCG TBCR Take by mouth.   Yes Historical Provider, MD  diltiazem (CARTIA XT) 180 MG 24 hr capsule Take 1 tablet by mouth 2 (two) times daily.   Yes Historical Provider, MD  enalapril (VASOTEC) 20 MG tablet Take 1 tablet by mouth daily.   Yes Historical Provider, MD  glimepiride (AMARYL) 4 MG tablet Take 1 tablet by mouth 2 (two) times daily. 09/13/14  Yes Historical Provider, MD  GLUCOSE BLOOD VI    Yes Historical Provider, MD  metFORMIN (GLUCOPHAGE) 500 MG tablet Take 1 tablet by mouth 2 (two) times daily. 10/25/14  Yes Historical Provider, MD  Multiple Vitamin (MULTI-VITAMINS) TABS Take by mouth.   Yes Historical Provider, MD  nitroGLYCERIN (NITROSTAT) 0.4 MG SL tablet 1 tab under the tongue for CP, may repeat in 5 minutes up to 3 doses 06/20/13  Yes Historical Provider, MD  Omega 3 1000 MG CAPS Take by mouth. 12/12/09  Yes Historical Provider, MD  pravastatin (PRAVACHOL) 20 MG tablet Take 1 tablet by mouth daily.   Yes Historical Provider, MD  TRADJENTA 5 MG TABS tablet TAKE 1 TABLET BY MOUTH EVERY DAY 06/04/15  Yes [name], MD    Allergies  Allergen Reactions  . Saxagliptin Diarrhea  . Codeine     Upset stomach  . Ezetimibe Other (See Comments)    myalgias  . Nitrofurantoin     Pruitus, rash  . Limonene Rash  .  Other Rash  . Sulfa Antibiotics Rash and Other (See Comments)    Sore mouth     Past Surgical History  Procedure Laterality Date  . Ptca  08/2013    Left common iliac  . Ptca  12/2012    left ext iliac  . Cardiac catheterization  1998    40% LM, 95% Ramus interm    Social History  Substance Use Topics  . Smoking status: Former Research scientist (life sciences)  . Smokeless tobacco: None  . Alcohol Use: 0.0 oz/week    0 Standard drinks or equivalent per week     Medication list has been reviewed and updated.  Physical  Examination:  Physical Exam  Constitutional: She appears well-developed and well-nourished.  Neck: Normal range of motion. Neck supple.  Cardiovascular: Normal rate, regular rhythm and normal heart sounds.   Pulmonary/Chest: Effort normal and breath sounds normal.  Abdominal: Soft. Bowel sounds are normal. There is no hepatosplenomegaly. There is tenderness in the epigastric area. There is no rigidity and no guarding.  Nursing note and vitals reviewed.   BP 148/52 mmHg  Pulse 60  Ht 5' 1.5" (1.562 m)  Wt 124 lb (56.246 kg)  BMI 23.05 kg/m2  Assessment and Plan: 1. Gastroesophageal reflux disease, esophagitis presence not specified Avoid alcohol and caffeine Elevate head of bed Return if symptoms persist despite medication - pantoprazole (PROTONIX) 40 MG tablet; Take 1 tablet (40 mg total) by mouth daily.  Dispense: 30 tablet; Refill: [place], MD [facility]  07/24/2015

## 2015-09-16 ENCOUNTER — Encounter: Payer: Self-pay | Admitting: Internal Medicine

## 2015-09-16 ENCOUNTER — Ambulatory Visit (INDEPENDENT_AMBULATORY_CARE_PROVIDER_SITE_OTHER): Payer: Medicare Other | Admitting: Internal Medicine

## 2015-09-16 ENCOUNTER — Ambulatory Visit
Admission: RE | Admit: 2015-09-16 | Discharge: 2015-09-16 | Disposition: A | Discharge status: Home / Self Care | Payer: Medicare Other | Source: Ambulatory Visit | Attending: Internal Medicine | Admitting: Internal Medicine

## 2015-09-16 VITALS — BP 132/60 | HR 60 | Ht 61.5 in | Wt 120.4 lb

## 2015-09-16 DIAGNOSIS — I771 Stricture of artery: Secondary | ICD-10-CM | POA: Diagnosis not present

## 2015-09-16 DIAGNOSIS — Z794 Long term (current) use of insulin: Secondary | ICD-10-CM

## 2015-09-16 DIAGNOSIS — M5137 Other intervertebral disc degeneration, lumbosacral region: Secondary | ICD-10-CM | POA: Diagnosis not present

## 2015-09-16 DIAGNOSIS — I10 Essential (primary) hypertension: Secondary | ICD-10-CM | POA: Diagnosis not present

## 2015-09-16 DIAGNOSIS — M545 Low back pain, unspecified: Secondary | ICD-10-CM

## 2015-09-16 DIAGNOSIS — E118 Type 2 diabetes mellitus with unspecified complications: Secondary | ICD-10-CM | POA: Insufficient documentation

## 2015-09-16 DIAGNOSIS — M5136 Other intervertebral disc degeneration, lumbar region: Secondary | ICD-10-CM | POA: Diagnosis not present

## 2015-09-16 DIAGNOSIS — E1165 Type 2 diabetes mellitus with hyperglycemia: Secondary | ICD-10-CM

## 2015-09-16 DIAGNOSIS — IMO0002 Reserved for concepts with insufficient information to code with codable children: Secondary | ICD-10-CM

## 2015-09-16 DIAGNOSIS — Z23 Encounter for immunization: Secondary | ICD-10-CM | POA: Diagnosis not present

## 2015-09-16 DIAGNOSIS — E1142 Type 2 diabetes mellitus with diabetic polyneuropathy: Secondary | ICD-10-CM | POA: Diagnosis not present

## 2015-09-16 DIAGNOSIS — E1129 Type 2 diabetes mellitus with other diabetic kidney complication: Secondary | ICD-10-CM | POA: Insufficient documentation

## 2015-09-16 NOTE — Progress Notes (Signed)
Date:  09/16/2015   Name:  Jill Hudson   DOB:  09/05/1935   MRN:  [id number]   Chief Complaint: Hypertension and Diabetes Hypertension This is a chronic problem. The current episode started more than 1 year ago. The problem is unchanged. The problem is controlled. Pertinent negatives include no chest pain, headaches, palpitations or shortness of breath. Past treatments include ACE inhibitors and calcium channel blockers. The current treatment provides significant improvement. There are no compliance problems.   Diabetes She presents for her follow-up diabetic visit. She has type 2 diabetes mellitus. Her disease course has been improving (since resuming tradjenta). There are no hypoglycemic associated symptoms. Pertinent negatives for hypoglycemia include no dizziness, headaches or pallor. Associated symptoms include weakness. Pertinent negatives for diabetes include no chest pain and no fatigue. Symptoms are improving. Diabetic complications include nephropathy. Current diabetic treatment includes oral agent (triple therapy). She is compliant with treatment most of the time.  Back Pain This is a new (pt had thought that Tradjenta was the cause but it did not resolve after stopping it.) problem. The current episode started more than 1 month ago. The problem occurs daily. The problem is unchanged. The pain is present in the lumbar spine. The pain does not radiate. The pain is mild. The symptoms are aggravated by bending and twisting. Associated symptoms include weakness. Pertinent negatives include no abdominal pain, bladder incontinence, bowel incontinence, chest pain, fever or headaches.   Claudication -Patient has history of peripheral vascular disease for which she's had 2 procedures on her left leg. She has mild disease on her right leg as well. She walks daily to help improve circulation. Recently she's noted that she cannot walk as far without having significant leg heaviness. She has continued on  her antiplatelet therapy. She denies blue discoloration of her feet or toes. Her vascular surgery follow-up is scheduled for February.   Review of Systems  Constitutional: Negative for fever, chills and fatigue.  Respiratory: Negative for chest tightness, shortness of breath and wheezing.   Cardiovascular: Negative for chest pain, palpitations and leg swelling.  Gastrointestinal: Negative for abdominal pain, diarrhea, constipation and bowel incontinence.  Genitourinary: Negative for bladder incontinence and difficulty urinating.  Musculoskeletal: Positive for myalgias (claudication in both legs) and back pain. Negative for joint swelling.  Skin: Negative for color change, pallor and wound.  Neurological: Positive for weakness. Negative for dizziness, syncope and headaches.    Patient Active Problem List   Diagnosis Date Noted  . GERD (gastroesophageal reflux disease) 07/24/2015  . Peripheral blood vessel disorder ([place]) 05/17/2015  . Neoplasm of uncertain behavior of skin 05/17/2015  . Background retinopathy due to secondary diabetes ([place]) 04/11/2015  . Diabetic peripheral neuropathy ([place]) 04/11/2015  . Diabetes mellitus with polyneuropathy ([place]) 04/11/2015  . HLD (hyperlipidemia) 04/11/2015  . Essential (primary) hypertension 04/11/2015  . Generalized OA 04/11/2015  . Arterial insufficiency ([place]) 04/11/2015  . Diabetes mellitus type 2, uncontrolled ([place]) 04/11/2015  . Arteriosclerosis of coronary artery 05/29/2013    Prior to Admission medications   Medication Sig Start Date End Date Taking? Authorizing Provider  aspirin 81 MG tablet Take 1 tablet by mouth daily.   Yes Historical Provider, MD  B Complex Vitamins (VITAMIN-B COMPLEX) TABS Take by mouth.   Yes Historical Provider, MD  Biotin 10 MG TABS Take by mouth.   Yes Historical Provider, MD  clopidogrel (PLAVIX) 75 MG tablet Take 1 tablet by mouth daily.   Yes Historical Provider, MD  Cyanocobalamin (RA VITAMIN  B-12 TR) 1000  MCG TBCR Take by mouth.   Yes Historical Provider, MD  diltiazem (CARTIA XT) 180 MG 24 hr capsule Take 1 tablet by mouth 2 (two) times daily.   Yes Historical Provider, MD  enalapril (VASOTEC) 20 MG tablet Take 1 tablet by mouth daily.   Yes Historical Provider, MD  glimepiride (AMARYL) 4 MG tablet Take 1 tablet by mouth 2 (two) times daily. 09/13/14  Yes Historical Provider, MD  GLUCOSE BLOOD VI    Yes Historical Provider, MD  metFORMIN (GLUCOPHAGE) 500 MG tablet Take 1 tablet by mouth 2 (two) times daily. 10/25/14  Yes Historical Provider, MD  Multiple Vitamin (MULTI-VITAMINS) TABS Take by mouth.   Yes Historical Provider, MD  nitroGLYCERIN (NITROSTAT) 0.4 MG SL tablet 1 tab under the tongue for CP, may repeat in 5 minutes up to 3 doses 06/20/13  Yes Historical Provider, MD  Omega 3 1000 MG CAPS Take by mouth. 12/12/09  Yes Historical Provider, MD  pravastatin (PRAVACHOL) 20 MG tablet Take 1 tablet by mouth daily.   Yes Historical Provider, MD  TRADJENTA 5 MG TABS tablet TAKE 1 TABLET BY MOUTH EVERY DAY 06/04/15  Yes [name], MD  pantoprazole (PROTONIX) 40 MG tablet Take 1 tablet (40 mg total) by mouth daily. Patient not taking: Reported on 09/16/2015 07/24/15   [name], MD    Allergies  Allergen Reactions  . Saxagliptin Diarrhea  . Codeine     Upset stomach  . Ezetimibe Other (See Comments)    myalgias  . Nitrofurantoin     Pruitus, rash  . Limonene Rash  . Other Rash  . Sulfa Antibiotics Rash and Other (See Comments)    Sore mouth     Past Surgical History  Procedure Laterality Date  . Ptca  08/2013    Left common iliac  . Ptca  12/2012    left ext iliac  . Cardiac catheterization  1998    40% LM, 95% Ramus interm    Social History  Substance Use Topics  . Smoking status: Former Research scientist (life sciences)  . Smokeless tobacco: None  . Alcohol Use: 0.0 oz/week    0 Standard drinks or equivalent per week    Medication list has been reviewed and updated.   Physical Exam   Constitutional: She is oriented to person, place, and time. She appears well-developed. No distress.  HENT:  Head: Normocephalic and atraumatic.  Eyes: Conjunctivae are normal. Right eye exhibits no discharge. Left eye exhibits no discharge. No scleral icterus.  Cardiovascular: Normal rate, regular rhythm and S1 normal.  Exam reveals decreased pulses.   Pulses:      Dorsalis pedis pulses are 1+ on the right side, and 1+ on the left side.       Posterior tibial pulses are 1+ on the right side, and 0 on the left side.  Pulmonary/Chest: Effort normal and breath sounds normal. No respiratory distress. She has no wheezes.  Musculoskeletal:       Lumbar back: She exhibits decreased range of motion and bony tenderness. She exhibits no swelling, no edema and no spasm.  Neurological: She is alert and oriented to person, place, and time. She has normal strength. A sensory deficit is present.  Reflex Scores:      Patellar reflexes are 1+ on the right side and 1+ on the left side. Skin: Skin is warm and dry. No rash noted.  Examination of both feet reveals mild bunions bilaterally. Skin is pink with fair  capillary refill. No ulcerations noted. Pulses are decreased.  Psychiatric: She has a normal mood and affect. Her behavior is normal. Thought content normal.  Nursing note and vitals reviewed.   BP 132/60 mmHg  Pulse 60  Ht 5' 1.5" (1.562 m)  Wt 120 lb 6.4 oz (54.613 kg)  BMI 22.38 kg/m2  Assessment and Plan: 1. Uncontrolled type 2 diabetes mellitus with diabetic polyneuropathy, without long-term current use of insulin (HCC) Should improve with the use of Tradjenta - Hemoglobin A1c  2. Essential (primary) hypertension Fair control continue current medications  3. Arterial insufficiency (HCC) Gradually worsening with decreased exercise tolerance Continue current medications and return to vascular surgery follow-up sooner if symptoms significantly worsen  4. Midline low back pain without  sciatica Suspect osteoarthritis; will obtain lumbar spine films - DG Lumbar Spine Complete; Future  5. Need for influenza vaccination - Flu Vaccine QUAD 36+ mos IM   [name], MD [facility]  09/16/2015

## 2015-09-17 ENCOUNTER — Ambulatory Visit: Payer: Medicare Other | Admitting: Internal Medicine

## 2015-09-17 DIAGNOSIS — Z1231 Encounter for screening mammogram for malignant neoplasm of breast: Secondary | ICD-10-CM | POA: Diagnosis not present

## 2015-09-17 LAB — HEMOGLOBIN A1C
Est. average glucose Bld gHb Est-mCnc: 209 mg/dL
HEMOGLOBIN A1C: 8.9 % — AB (ref 4.8–5.6)

## 2015-10-08 ENCOUNTER — Other Ambulatory Visit: Payer: Self-pay | Admitting: Internal Medicine

## 2015-10-10 DIAGNOSIS — I2510 Atherosclerotic heart disease of native coronary artery without angina pectoris: Secondary | ICD-10-CM | POA: Diagnosis not present

## 2015-10-10 DIAGNOSIS — I10 Essential (primary) hypertension: Secondary | ICD-10-CM | POA: Diagnosis not present

## 2015-10-10 DIAGNOSIS — E7800 Pure hypercholesterolemia, unspecified: Secondary | ICD-10-CM | POA: Diagnosis not present

## 2015-10-10 DIAGNOSIS — I739 Peripheral vascular disease, unspecified: Secondary | ICD-10-CM | POA: Diagnosis not present

## 2015-11-14 DIAGNOSIS — M79609 Pain in unspecified limb: Secondary | ICD-10-CM | POA: Diagnosis not present

## 2015-11-14 DIAGNOSIS — I70213 Atherosclerosis of native arteries of extremities with intermittent claudication, bilateral legs: Secondary | ICD-10-CM | POA: Diagnosis not present

## 2015-11-14 DIAGNOSIS — I8311 Varicose veins of right lower extremity with inflammation: Secondary | ICD-10-CM | POA: Diagnosis not present

## 2015-11-14 DIAGNOSIS — I70219 Atherosclerosis of native arteries of extremities with intermittent claudication, unspecified extremity: Secondary | ICD-10-CM | POA: Diagnosis not present

## 2015-11-14 DIAGNOSIS — I10 Essential (primary) hypertension: Secondary | ICD-10-CM | POA: Diagnosis not present

## 2015-11-14 DIAGNOSIS — I872 Venous insufficiency (chronic) (peripheral): Secondary | ICD-10-CM | POA: Diagnosis not present

## 2015-11-14 DIAGNOSIS — I70211 Atherosclerosis of native arteries of extremities with intermittent claudication, right leg: Secondary | ICD-10-CM | POA: Diagnosis not present

## 2015-11-14 DIAGNOSIS — I2510 Atherosclerotic heart disease of native coronary artery without angina pectoris: Secondary | ICD-10-CM | POA: Diagnosis not present

## 2015-11-14 DIAGNOSIS — I8312 Varicose veins of left lower extremity with inflammation: Secondary | ICD-10-CM | POA: Diagnosis not present

## 2015-11-14 DIAGNOSIS — E785 Hyperlipidemia, unspecified: Secondary | ICD-10-CM | POA: Diagnosis not present

## 2015-11-14 DIAGNOSIS — I6523 Occlusion and stenosis of bilateral carotid arteries: Secondary | ICD-10-CM | POA: Diagnosis not present

## 2015-11-25 ENCOUNTER — Other Ambulatory Visit: Payer: Self-pay | Admitting: Internal Medicine

## 2015-12-02 DIAGNOSIS — E113393 Type 2 diabetes mellitus with moderate nonproliferative diabetic retinopathy without macular edema, bilateral: Secondary | ICD-10-CM | POA: Diagnosis not present

## 2016-01-16 ENCOUNTER — Encounter: Payer: Self-pay | Admitting: Internal Medicine

## 2016-01-16 ENCOUNTER — Ambulatory Visit
Admission: RE | Admit: 2016-01-16 | Discharge: 2016-01-16 | Disposition: A | Discharge status: Home / Self Care | Payer: Medicare Other | Source: Ambulatory Visit | Attending: Internal Medicine | Admitting: Internal Medicine

## 2016-01-16 ENCOUNTER — Ambulatory Visit (INDEPENDENT_AMBULATORY_CARE_PROVIDER_SITE_OTHER): Payer: Medicare Other | Admitting: Internal Medicine

## 2016-01-16 ENCOUNTER — Other Ambulatory Visit: Payer: Self-pay | Admitting: Internal Medicine

## 2016-01-16 VITALS — BP 144/62 | HR 72 | Ht 61.5 in | Wt 122.6 lb

## 2016-01-16 DIAGNOSIS — I10 Essential (primary) hypertension: Secondary | ICD-10-CM | POA: Diagnosis not present

## 2016-01-16 DIAGNOSIS — S59912A Unspecified injury of left forearm, initial encounter: Secondary | ICD-10-CM | POA: Diagnosis not present

## 2016-01-16 DIAGNOSIS — R2232 Localized swelling, mass and lump, left upper limb: Secondary | ICD-10-CM

## 2016-01-16 DIAGNOSIS — Z0000 Encounter for general adult medical examination without abnormal findings: Secondary | ICD-10-CM | POA: Diagnosis not present

## 2016-01-16 DIAGNOSIS — I739 Peripheral vascular disease, unspecified: Secondary | ICD-10-CM

## 2016-01-16 DIAGNOSIS — IMO0002 Reserved for concepts with insufficient information to code with codable children: Secondary | ICD-10-CM

## 2016-01-16 DIAGNOSIS — N183 Chronic kidney disease, stage 3 unspecified: Secondary | ICD-10-CM

## 2016-01-16 DIAGNOSIS — E1122 Type 2 diabetes mellitus with diabetic chronic kidney disease: Secondary | ICD-10-CM | POA: Insufficient documentation

## 2016-01-16 DIAGNOSIS — R7879 Finding of abnormal level of heavy metals in blood: Secondary | ICD-10-CM | POA: Diagnosis not present

## 2016-01-16 DIAGNOSIS — E785 Hyperlipidemia, unspecified: Secondary | ICD-10-CM | POA: Diagnosis not present

## 2016-01-16 DIAGNOSIS — N3000 Acute cystitis without hematuria: Secondary | ICD-10-CM

## 2016-01-16 DIAGNOSIS — E1142 Type 2 diabetes mellitus with diabetic polyneuropathy: Secondary | ICD-10-CM | POA: Diagnosis not present

## 2016-01-16 DIAGNOSIS — N189 Chronic kidney disease, unspecified: Secondary | ICD-10-CM

## 2016-01-16 DIAGNOSIS — M85832 Other specified disorders of bone density and structure, left forearm: Secondary | ICD-10-CM | POA: Insufficient documentation

## 2016-01-16 DIAGNOSIS — E1165 Type 2 diabetes mellitus with hyperglycemia: Secondary | ICD-10-CM

## 2016-01-16 DIAGNOSIS — R233 Spontaneous ecchymoses: Secondary | ICD-10-CM

## 2016-01-16 LAB — POC URINALYSIS WITH MICROSCOPIC (NON AUTO)MANUAL RESULT
BILIRUBIN UA: NEGATIVE
Glucose, UA: NEGATIVE
Ketones, UA: NEGATIVE
Nitrite, UA: POSITIVE
PH UA: 6.5
PROTEIN UA: 30
Spec Grav, UA: 1.02
UROBILINOGEN UA: 0.2

## 2016-01-16 MED ORDER — CIPROFLOXACIN HCL 250 MG PO TABS: 250.0000 mg | ORAL_TABLET | Freq: Two times a day (BID) | ORAL | Status: DC

## 2016-01-16 NOTE — Patient Instructions (Signed)
Health Maintenance  Topic Date Due  . INFLUENZA VACCINE  05/26/2016  . HEMOGLOBIN A1C  07/18/2016  . FOOT EXAM  09/15/2016  . OPHTHALMOLOGY EXAM  12/01/2016  . TETANUS/TDAP  10/26/2018  . DEXA SCAN  Addressed  . ZOSTAVAX  Completed  . PNA vac Low Risk Adult  Completed   Breast Self-Awareness Practicing breast self-awareness may pick up problems early, prevent significant medical complications, and possibly save your life. By practicing breast self-awareness, you can become familiar with how your breasts look and feel and if your breasts are changing. This allows you to notice changes early. It can also offer you some reassurance that your breast health is good. One way to learn what is normal for your breasts and whether your breasts are changing is to do a breast self-exam. If you find a lump or something that was not present in the past, it is best to contact your caregiver right away. Other findings that should be evaluated by your caregiver include nipple discharge, especially if it is bloody; skin changes or reddening; areas where the skin seems to be pulled in (retracted); or new lumps and bumps. Breast pain is seldom associated with cancer (malignancy), but should also be evaluated by a caregiver. HOW TO PERFORM A BREAST SELF-EXAM The best time to examine your breasts is 5-7 days after your menstrual period is over. During menstruation, the breasts are lumpier, and it may be more difficult to pick up changes. If you do not menstruate, have reached menopause, or had your uterus removed (hysterectomy), you should examine your breasts at regular intervals, such as monthly. If you are breastfeeding, examine your breasts after a feeding or after using a breast pump. Breast implants do not decrease the risk for lumps or tumors, so continue to perform breast self-exams as recommended. Talk to your caregiver about how to determine the difference between the implant and breast tissue. Also, talk about  the amount of pressure you should use during the exam. Over time, you will become more familiar with the variations of your breasts and more comfortable with the exam. A breast self-exam requires you to remove all your clothes above the waist. 1. Look at your breasts and nipples. Stand in front of a mirror in a room with good lighting. With your hands on your hips, push your hands firmly downward. Look for a difference in shape, contour, and size from one breast to the other (asymmetry). Asymmetry includes puckers, dips, or bumps. Also, look for skin changes, such as reddened or scaly areas on the breasts. Look for nipple changes, such as discharge, dimpling, repositioning, or redness. 2. Carefully feel your breasts. This is best done either in the shower or tub while using soapy water or when flat on your back. Place the arm (on the side of the breast you are examining) above your head. Use the pads (not the fingertips) of your three middle fingers on your opposite hand to feel your breasts. Start in the underarm area and use  inch (2 cm) overlapping circles to feel your breast. Use 3 different levels of pressure (light, medium, and firm pressure) at each circle before moving to the next circle. The light pressure is needed to feel the tissue closest to the skin. The medium pressure will help to feel breast tissue a little deeper, while the firm pressure is needed to feel the tissue close to the ribs. Continue the overlapping circles, moving downward over the breast until you feel your ribs  below your breast. Then, move one finger-width towards the center of the body. Continue to use the  inch (2 cm) overlapping circles to feel your breast as you move slowly up toward the collar bone (clavicle) near the base of the neck. Continue the up and down exam using all 3 pressures until you reach the middle of the chest. Do this with each breast, carefully feeling for lumps or changes. 3.  Keep a written record with  breast changes or normal findings for each breast. By writing this information down, you do not need to depend only on memory for size, tenderness, or location. Write down where you are in your menstrual cycle, if you are still menstruating. Breast tissue can have some lumps or thick tissue. However, see your caregiver if you find anything that concerns you.  SEEK MEDICAL CARE IF:  You see a change in shape, contour, or size of your breasts or nipples.   You see skin changes, such as reddened or scaly areas on the breasts or nipples.   You have an unusual discharge from your nipples.   You feel a new lump or unusually thick areas.    This information is not intended to replace advice given to you by your health care provider. Make sure you discuss any questions you have with your health care provider.   Document Released: 10/12/2005 Document Revised: 09/28/2012 Document Reviewed: 01/27/2012 Elsevier Interactive Patient Education [employer].

## 2016-01-16 NOTE — Progress Notes (Signed)
Patient: Jill Hudson, Female    DOB: [birth year], 81 y.o.   MRN: [id number] Visit Date: 01/16/2016  Today's Provider: [name], MD   Chief Complaint  Patient presents with  . Medicare Wellness  . Diabetes  . Hypertension   Subjective:    Annual wellness visit Jill Hudson is a 81 y.o. female who presents today for her Subsequent Annual Wellness Visit. She feels fairly well. She reports exercising by walking. She reports she is sleeping fairly well. She recently had a mammogram.  ----------------------------------------------------------- Diabetes She presents for her follow-up diabetic visit. She has type 2 diabetes mellitus. Her disease course has been stable. Pertinent negatives for hypoglycemia include no dizziness, headaches, nervousness/anxiousness or tremors. Pertinent negatives for diabetes include no chest pain, no fatigue, no polydipsia, no polyuria and no weakness. Symptoms are stable. Current diabetic treatment includes oral agent (dual therapy). She is compliant with treatment most of the time. Her breakfast blood glucose is taken between 7-8 am. Her breakfast blood glucose range is generally 110-130 mg/dl.  Hypertension This is a chronic problem. The current episode started more than 1 year ago. The problem is unchanged. The problem is controlled. Pertinent negatives include no chest pain, headaches, palpitations or shortness of breath. Past treatments include ACE inhibitors and calcium channel blockers.  CAD - stable without recent chest pain or change in stamina. She has follow up with Cardiology every 6 months.  She has had no change in medication. PVD - She continues to walk daily.  She has developed some small collateral veins.  There has been no worsening of lower leg symptoms.  Review of Systems  Constitutional: Negative for fever, chills and fatigue.  HENT: Negative for congestion, hearing loss, tinnitus, trouble swallowing and voice change.   Eyes: Negative  for visual disturbance.  Respiratory: Negative for cough, chest tightness, shortness of breath and wheezing.   Cardiovascular: Negative for chest pain, palpitations and leg swelling.  Gastrointestinal: Negative for vomiting, abdominal pain, diarrhea and constipation.  Endocrine: Negative for polydipsia and polyuria.  Genitourinary: Positive for frequency. Negative for dysuria, hematuria, vaginal bleeding, vaginal discharge and genital sores.  Musculoskeletal: Positive for myalgias (swelling on left forearm from trauma - no change in 6 months and painful). Negative for joint swelling, arthralgias and gait problem.  Skin: Negative for color change and rash.  Neurological: Negative for dizziness, tremors, syncope, weakness, light-headedness and headaches.  Hematological: Negative for adenopathy. Does not bruise/bleed easily.  Psychiatric/Behavioral: Negative for sleep disturbance and dysphoric mood. The patient is not nervous/anxious.     Social History   Social History  . Marital Status: Married    Spouse Name: N/A  . Number of Children: N/A  . Years of Education: N/A   Occupational History  . Not on file.   Social History Main Topics  . Smoking status: Former Research scientist (life sciences)  . Smokeless tobacco: Not on file  . Alcohol Use: 0.0 oz/week    0 Standard drinks or equivalent per week  . Drug Use: No  . Sexual Activity: Not on file   Other Topics Concern  . Not on file   Social History Narrative    Patient Active Problem List   Diagnosis Date Noted  . Chronic renal insufficiency, stage III (moderate) 01/16/2016  . Senile ecchymosis 01/16/2016  . Uncontrolled type 2 diabetes mellitus with diabetic polyneuropathy, without long-term current use of insulin ([place]) 09/16/2015  . GERD (gastroesophageal reflux disease) 07/24/2015  . Peripheral blood vessel disorder ([place]) 05/17/2015  .  Neoplasm of uncertain behavior of skin 05/17/2015  . Background retinopathy due to secondary diabetes ([place])  04/11/2015  . Diabetes mellitus with polyneuropathy ([place]) 04/11/2015  . HLD (hyperlipidemia) 04/11/2015  . Essential (primary) hypertension 04/11/2015  . Generalized OA 04/11/2015  . Arterial insufficiency ([place]) 04/11/2015  . Arteriosclerosis of coronary artery 05/29/2013  . Hypertensive heart disease without CHF 05/29/2013    Past Surgical History  Procedure Laterality Date  . Ptca  08/2013    Left common iliac  . Ptca  12/2012    left ext iliac  . Cardiac catheterization  1998    40% LM, 95% Ramus interm    Her family history includes Dementia in her mother; Diabetes in her father.    Previous Medications   ASPIRIN 81 MG TABLET    Take 1 tablet by mouth daily.   B COMPLEX VITAMINS (VITAMIN-B COMPLEX) TABS    Take by mouth.   BIOTIN 10 MG TABS    Take by mouth.   CLOPIDOGREL (PLAVIX) 75 MG TABLET    Take 1 tablet by mouth daily.   CYANOCOBALAMIN (RA VITAMIN B-12 TR) 1000 MCG TBCR    Take by mouth.   DILTIAZEM (CARTIA XT) 180 MG 24 HR CAPSULE    Take 1 tablet by mouth 2 (two) times daily.   ENALAPRIL (VASOTEC) 20 MG TABLET    Take 1 tablet by mouth daily.   GLIMEPIRIDE (AMARYL) 4 MG TABLET    TAKE 1 TABLET BY MOUTH TWICE DAILY   GLUCOSE BLOOD VI       METFORMIN (GLUCOPHAGE-XR) 500 MG 24 HR TABLET    TAKE 1 TABLET BY MOUTH TWICE DAILY   MULTIPLE VITAMIN (MULTI-VITAMINS) TABS    Take by mouth.   NITROGLYCERIN (NITROSTAT) 0.4 MG SL TABLET    1 tab under the tongue for CP, may repeat in 5 minutes up to 3 doses   OMEGA 3 1000 MG CAPS    Take by mouth.   PANTOPRAZOLE (PROTONIX) 40 MG TABLET    Take 1 tablet (40 mg total) by mouth daily.   PRAVASTATIN (PRAVACHOL) 20 MG TABLET    Take 1 tablet by mouth daily.   TRADJENTA 5 MG TABS TABLET    TAKE 1 TABLET BY MOUTH EVERY DAY    Patient Care Team: [name], MD as PCP - General (Family Medicine)     Objective:   Vitals: BP 144/62 mmHg  Pulse 72  Ht 5' 1.5" (1.562 m)  Wt 122 lb 9.6 oz (55.611 kg)  BMI 22.79  kg/m2  Physical Exam  Constitutional: She is oriented to person, place, and time. She appears well-developed and well-nourished. No distress.  HENT:  Head: Normocephalic and atraumatic.  Right Ear: Tympanic membrane and ear canal normal.  Left Ear: Tympanic membrane and ear canal normal.  Nose: Right sinus exhibits no maxillary sinus tenderness. Left sinus exhibits no maxillary sinus tenderness.  Mouth/Throat: Uvula is midline and oropharynx is clear and moist.  Eyes: Conjunctivae and EOM are normal. Right eye exhibits no discharge. Left eye exhibits no discharge. No scleral icterus.  Neck: Normal range of motion. Carotid bruit is not present. No erythema present. No thyromegaly present.  Cardiovascular: Normal rate, regular rhythm and normal heart sounds.   Pulses:      Dorsalis pedis pulses are 1+ on the right side, and 1+ on the left side.       Posterior tibial pulses are 1+ on the right side, and 1+ on the left  side.  Pulmonary/Chest: Effort normal. No respiratory distress. She has no wheezes. Right breast exhibits no mass, no nipple discharge, no skin change and no tenderness. Left breast exhibits no mass, no nipple discharge, no skin change and no tenderness.  Abdominal: Soft. Bowel sounds are normal. There is no hepatosplenomegaly. There is no tenderness. There is no CVA tenderness.  Musculoskeletal: Normal range of motion. She exhibits tenderness. She exhibits no edema.       Arms: Lymphadenopathy:    She has no cervical adenopathy.    She has no axillary adenopathy.  Neurological: She is alert and oriented to person, place, and time. She has normal reflexes. No cranial nerve deficit or sensory deficit.  Skin: Skin is warm, dry and intact. No rash noted.  Psychiatric: She has a normal mood and affect. Her speech is normal and behavior is normal. Thought content normal.  Nursing note and vitals reviewed.   Activities of Daily Living In your present state of health, do you have  any difficulty performing the following activities: 05/17/2015  Hearing? Y  Vision? Y  Difficulty concentrating or making decisions? N  Walking or climbing stairs? Y  Dressing or bathing? N  Doing errands, shopping? N    Fall Risk Assessment Fall Risk  05/17/2015  Falls in the past year? No      Depression Screen PHQ 2/9 Scores 05/17/2015  PHQ - 2 Score 0    Cognitive Testing - 6-CIT   Correct? Score   What year is it? yes 0 Yes = 0    No = 4  What month is it? yes 0 Yes = 0    No = 3  Remember:     [address]ia [address], [place], [place]     What time is it? yes 0 Yes = 0    No = 3  Count backwards from 20 to 1 yes 0 Correct = 0    1 error = 2   More than 1 error = 4  Say the months of the year in reverse. yes 0 Correct = 0    1 error = 2   More than 1 error = 4  What address did I ask you to remember? no 2 Correct = 0  1 error = 2    2 error = 4    3 error = 6    4 error = 8    All wrong = 10       TOTAL SCORE  2/28   Interpretation:  Normal  Normal (0-7) Abnormal (8-28)        Medicare Annual Wellness Visit Summary:  Reviewed patient's Family Medical History Reviewed and updated list of patient's medical providers Assessment of cognitive impairment was done Assessed patient's functional ability Established a written schedule for health screening [place] Completed and Reviewed  Exercise Activities and Dietary recommendations Goals    None      Immunization History  Administered Date(s) Administered  . Influenza,inj,Quad PF,36+ Mos 09/16/2015  . Pneumococcal Conjugate-13 09/10/2014  . Pneumococcal-Unspecified 09/04/2010  . Tdap 10/26/2008  . Zoster 05/07/2011    Health Maintenance  Topic Date Due  . INFLUENZA VACCINE  05/26/2016  . HEMOGLOBIN A1C  07/18/2016  . FOOT EXAM  09/15/2016  . OPHTHALMOLOGY EXAM  12/01/2016  . TETANUS/TDAP  10/26/2018  . DEXA SCAN  Addressed  . ZOSTAVAX  Completed  . PNA vac Low Risk Adult  Completed  Discussed health benefits of physical activity, and encouraged her to engage in regular exercise appropriate for her age and condition.    ------------------------------------------------------------------------------------------------------------   Assessment & Plan:  1. Medicare annual wellness visit, subsequent MAW measures satisfied - POC urinalysis w microscopic (non auto)  2. Uncontrolled type 2 diabetes mellitus with diabetic polyneuropathy, without long-term current use of insulin (HCC) Continue current oral therapy - Hemoglobin A1c - TSH  3. Peripheral blood vessel disorder (HCC) stable  4. Essential (primary) hypertension controlled - Comprehensive metabolic panel - CBC with Differential/Platelet  5. HLD (hyperlipidemia)  on statin therapy - Lipid panel  6. Diabetic polyneuropathy associated with type 2 diabetes mellitus (HCC) stable  7. Chronic renal insufficiency, stage III (moderate) Will continue to monitor at regular intervals  8. Mass of arm, left Concern for bone pathology - DG Forearm Left; Future  9. Senile ecchymosis   10. Acute cystitis without hematuria Minimally symptomatic - recommend increased fluids - ciprofloxacin (CIPRO) 250 MG tablet; Take 1 tablet (250 mg total) by mouth 2 (two) times daily.  Dispense: 10 tablet; Refill: 0   [name], MD [facility]  01/16/2016

## 2016-01-17 ENCOUNTER — Telehealth: Payer: Self-pay

## 2016-01-17 LAB — CBC WITH DIFFERENTIAL/PLATELET
Basophils Absolute: 0 10*3/uL (ref 0.0–0.2)
Basos: 0 %
EOS (ABSOLUTE): 0.2 10*3/uL (ref 0.0–0.4)
EOS: 3 %
HEMOGLOBIN: 11.2 g/dL (ref 11.1–15.9)
Hematocrit: 34.5 % (ref 34.0–46.6)
IMMATURE GRANS (ABS): 0 10*3/uL (ref 0.0–0.1)
IMMATURE GRANULOCYTES: 0 %
LYMPHS: 21 %
Lymphocytes Absolute: 1.8 10*3/uL (ref 0.7–3.1)
MCH: 28.5 pg (ref 26.6–33.0)
MCHC: 32.5 g/dL (ref 31.5–35.7)
MCV: 88 fL (ref 79–97)
MONOS ABS: 0.5 10*3/uL (ref 0.1–0.9)
Monocytes: 6 %
NEUTROS PCT: 70 %
Neutrophils Absolute: 6.1 10*3/uL (ref 1.4–7.0)
Platelets: 242 10*3/uL (ref 150–379)
RBC: 3.93 x10E6/uL (ref 3.77–5.28)
RDW: 13.4 % (ref 12.3–15.4)
WBC: 8.8 10*3/uL (ref 3.4–10.8)

## 2016-01-17 LAB — LIPID PANEL
CHOL/HDL RATIO: 2.9 ratio (ref 0.0–4.4)
CHOLESTEROL TOTAL: 181 mg/dL (ref 100–199)
HDL: 63 mg/dL (ref >39)
LDL CALC: 95 mg/dL (ref 0–99)
TRIGLYCERIDES: 115 mg/dL (ref 0–149)
VLDL Cholesterol Cal: 23 mg/dL (ref 5–40)

## 2016-01-17 LAB — COMPREHENSIVE METABOLIC PANEL
A/G RATIO: 2 (ref 1.2–2.2)
ALK PHOS: 66 IU/L (ref 39–117)
ALT: 16 IU/L (ref 0–32)
AST: 16 IU/L (ref 0–40)
Albumin: 4.3 g/dL (ref 3.5–4.8)
BUN/Creatinine Ratio: 16 (ref 11–26)
BUN: 19 mg/dL (ref 8–27)
CHLORIDE: 97 mmol/L (ref 96–106)
CO2: 20 mmol/L (ref 18–29)
Calcium: 9.9 mg/dL (ref 8.7–10.3)
Creatinine, Ser: 1.22 mg/dL — ABNORMAL HIGH (ref 0.57–1.00)
GFR calc non Af Amer: 42 mL/min/{1.73_m2} — ABNORMAL LOW (ref >59)
GFR, EST AFRICAN AMERICAN: 49 mL/min/{1.73_m2} — AB (ref >59)
GLUCOSE: 192 mg/dL — AB (ref 65–99)
Globulin, Total: 2.2 g/dL (ref 1.5–4.5)
POTASSIUM: 4.9 mmol/L (ref 3.5–5.2)
Sodium: 136 mmol/L (ref 134–144)
TOTAL PROTEIN: 6.5 g/dL (ref 6.0–8.5)

## 2016-01-17 LAB — HEMOGLOBIN A1C
Est. average glucose Bld gHb Est-mCnc: 192 mg/dL
HEMOGLOBIN A1C: 8.3 % — AB (ref 4.8–5.6)

## 2016-01-17 LAB — TSH: TSH: 5.05 u[IU]/mL — ABNORMAL HIGH (ref 0.450–4.500)

## 2016-01-17 NOTE — Telephone Encounter (Signed)
-----   Message from [name], MD sent at 01/17/2016 11:56 AM EDT ----- No abnormality noted on Xray.  I suggest a Dermatology evaluation as a starting point.

## 2016-01-17 NOTE — Telephone Encounter (Signed)
Spoke with patient. Patient advised of all results and verbalized understanding. Will call back with any future questions or concerns. MAH  

## 2016-01-18 ENCOUNTER — Encounter: Payer: Self-pay | Admitting: Internal Medicine

## 2016-01-18 DIAGNOSIS — R7989 Other specified abnormal findings of blood chemistry: Secondary | ICD-10-CM | POA: Insufficient documentation

## 2016-01-20 ENCOUNTER — Telehealth: Payer: Self-pay

## 2016-01-20 NOTE — Telephone Encounter (Signed)
Spoke with patient. Patient advised of all results and verbalized understanding. Will call back with any future questions or concerns. MAH  

## 2016-01-20 NOTE — Telephone Encounter (Signed)
-----   Message from [name], MD sent at 01/17/2016  1:00 PM EDT ----- DM is better!  Down to 8.3.  Kidney function is stable.  Cholesterol is good.  Thyroid is borderline low - need to add free T4 and T3.  Will also send results to Dr. Benjiman Core [facility] Card[facility].

## 2016-01-20 NOTE — Telephone Encounter (Signed)
-----   Message from [name], MD sent at 01/18/2016 12:55 PM EDT ----- Thyroid function is normal.

## 2016-01-22 LAB — SPECIMEN STATUS REPORT

## 2016-01-22 LAB — T4, FREE: Free T4: 1.22 ng/dL (ref 0.82–1.77)

## 2016-01-22 LAB — T3: T3 TOTAL: 124 ng/dL (ref 71–180)

## 2016-02-04 DIAGNOSIS — D485 Neoplasm of uncertain behavior of skin: Secondary | ICD-10-CM | POA: Diagnosis not present

## 2016-02-04 DIAGNOSIS — D1722 Benign lipomatous neoplasm of skin and subcutaneous tissue of left arm: Secondary | ICD-10-CM | POA: Diagnosis not present

## 2016-02-06 ENCOUNTER — Other Ambulatory Visit: Payer: Self-pay

## 2016-02-06 ENCOUNTER — Encounter: Payer: Self-pay | Admitting: *Deleted

## 2016-02-06 ENCOUNTER — Ambulatory Visit
Admission: EM | Admit: 2016-02-06 | Discharge: 2016-02-06 | Disposition: A | Discharge status: Home / Self Care | Payer: Medicare Other | Attending: Family Medicine | Admitting: Family Medicine

## 2016-02-06 DIAGNOSIS — I70211 Atherosclerosis of native arteries of extremities with intermittent claudication, right leg: Secondary | ICD-10-CM | POA: Diagnosis not present

## 2016-02-06 DIAGNOSIS — E119 Type 2 diabetes mellitus without complications: Secondary | ICD-10-CM | POA: Diagnosis not present

## 2016-02-06 DIAGNOSIS — I872 Venous insufficiency (chronic) (peripheral): Secondary | ICD-10-CM | POA: Diagnosis not present

## 2016-02-06 DIAGNOSIS — I70212 Atherosclerosis of native arteries of extremities with intermittent claudication, left leg: Secondary | ICD-10-CM | POA: Diagnosis not present

## 2016-02-06 DIAGNOSIS — Z87891 Personal history of nicotine dependence: Secondary | ICD-10-CM | POA: Insufficient documentation

## 2016-02-06 DIAGNOSIS — I8312 Varicose veins of left lower extremity with inflammation: Secondary | ICD-10-CM | POA: Diagnosis not present

## 2016-02-06 DIAGNOSIS — Z79899 Other long term (current) drug therapy: Secondary | ICD-10-CM | POA: Insufficient documentation

## 2016-02-06 DIAGNOSIS — F419 Anxiety disorder, unspecified: Secondary | ICD-10-CM | POA: Insufficient documentation

## 2016-02-06 DIAGNOSIS — M79609 Pain in unspecified limb: Secondary | ICD-10-CM | POA: Diagnosis not present

## 2016-02-06 DIAGNOSIS — N390 Urinary tract infection, site not specified: Secondary | ICD-10-CM | POA: Diagnosis not present

## 2016-02-06 DIAGNOSIS — I2510 Atherosclerotic heart disease of native coronary artery without angina pectoris: Secondary | ICD-10-CM | POA: Diagnosis not present

## 2016-02-06 DIAGNOSIS — I70219 Atherosclerosis of native arteries of extremities with intermittent claudication, unspecified extremity: Secondary | ICD-10-CM | POA: Diagnosis not present

## 2016-02-06 DIAGNOSIS — I6523 Occlusion and stenosis of bilateral carotid arteries: Secondary | ICD-10-CM | POA: Diagnosis not present

## 2016-02-06 DIAGNOSIS — E785 Hyperlipidemia, unspecified: Secondary | ICD-10-CM | POA: Diagnosis not present

## 2016-02-06 DIAGNOSIS — Z7982 Long term (current) use of aspirin: Secondary | ICD-10-CM | POA: Diagnosis not present

## 2016-02-06 DIAGNOSIS — I10 Essential (primary) hypertension: Secondary | ICD-10-CM | POA: Diagnosis not present

## 2016-02-06 DIAGNOSIS — R42 Dizziness and giddiness: Secondary | ICD-10-CM | POA: Insufficient documentation

## 2016-02-06 DIAGNOSIS — I8311 Varicose veins of right lower extremity with inflammation: Secondary | ICD-10-CM | POA: Diagnosis not present

## 2016-02-06 DIAGNOSIS — I70213 Atherosclerosis of native arteries of extremities with intermittent claudication, bilateral legs: Secondary | ICD-10-CM | POA: Diagnosis not present

## 2016-02-06 HISTORY — DX: Urinary tract infection, site not specified: N39.0

## 2016-02-06 HISTORY — DX: Zoster without complications: B02.9

## 2016-02-06 HISTORY — DX: Peripheral vascular disease, unspecified: I73.9

## 2016-02-06 HISTORY — DX: Anxiety disorder, unspecified: F41.9

## 2016-02-06 HISTORY — DX: Essential (primary) hypertension: I10

## 2016-02-06 HISTORY — DX: Rheumatic disorders of both mitral and aortic valves: I08.0

## 2016-02-06 HISTORY — DX: Type 2 diabetes mellitus without complications: E11.9

## 2016-02-06 LAB — BASIC METABOLIC PANEL
Anion gap: 6 (ref 5–15)
BUN: 27 mg/dL — ABNORMAL HIGH (ref 6–20)
CHLORIDE: 103 mmol/L (ref 101–111)
CO2: 23 mmol/L (ref 22–32)
CREATININE: 1.49 mg/dL — AB (ref 0.44–1.00)
Calcium: 9.3 mg/dL (ref 8.9–10.3)
GFR calc non Af Amer: 32 mL/min — ABNORMAL LOW (ref >60)
GFR, EST AFRICAN AMERICAN: 37 mL/min — AB (ref >60)
Glucose, Bld: 171 mg/dL — ABNORMAL HIGH (ref 65–99)
POTASSIUM: 5.5 mmol/L — AB (ref 3.5–5.1)
Sodium: 132 mmol/L — ABNORMAL LOW (ref 135–145)

## 2016-02-06 LAB — URINALYSIS COMPLETE WITH MICROSCOPIC (ARMC ONLY)
BILIRUBIN URINE: NEGATIVE
Glucose, UA: NEGATIVE mg/dL
Hgb urine dipstick: NEGATIVE
Nitrite: NEGATIVE
Protein, ur: NEGATIVE mg/dL
SPECIFIC GRAVITY, URINE: 1.015 (ref 1.005–1.030)
SQUAMOUS EPITHELIAL / LPF: NONE SEEN
pH: 5.5 (ref 5.0–8.0)

## 2016-02-06 LAB — CBC WITH DIFFERENTIAL/PLATELET
BASOS ABS: 0.1 10*3/uL (ref 0–0.1)
BASOS PCT: 2 %
Eosinophils Absolute: 0.3 10*3/uL (ref 0–0.7)
Eosinophils Relative: 4 %
HEMATOCRIT: 34 % — AB (ref 35.0–47.0)
HEMOGLOBIN: 11.2 g/dL — AB (ref 12.0–16.0)
Lymphocytes Relative: 20 %
Lymphs Abs: 1.8 10*3/uL (ref 1.0–3.6)
MCH: 28.9 pg (ref 26.0–34.0)
MCHC: 33 g/dL (ref 32.0–36.0)
MCV: 87.5 fL (ref 80.0–100.0)
MONOS PCT: 8 %
Monocytes Absolute: 0.7 10*3/uL (ref 0.2–0.9)
NEUTROS ABS: 6 10*3/uL (ref 1.4–6.5)
NEUTROS PCT: 66 %
Platelets: 268 10*3/uL (ref 150–440)
RBC: 3.88 MIL/uL (ref 3.80–5.20)
RDW: 13.2 % (ref 11.5–14.5)
WBC: 9 10*3/uL (ref 3.6–11.0)

## 2016-02-06 MED ORDER — CEFUROXIME AXETIL 250 MG PO TABS: 250.0000 mg | ORAL_TABLET | Freq: Two times a day (BID) | ORAL | Status: DC

## 2016-02-06 NOTE — Discharge Instructions (Signed)
Dizziness Dizziness is a common problem. It makes you feel unsteady or lightheaded. You may feel like you are about to pass out (faint). Dizziness can lead to injury if you stumble or fall. Anyone can get dizzy, but dizziness is more common in older adults. This condition can be caused by a number of things, including:  Medicines.  Dehydration.  Illness. HOME CARE Following these instructions may help with your condition: Eating and Drinking  Drink enough fluid to keep your pee (urine) clear or pale yellow. This helps to keep you from getting dehydrated. Try to drink more clear fluids, such as water.  Do not drink alcohol.  Limit how much caffeine you drink or eat if told by your doctor.  Limit how much salt you drink or eat if told by your doctor. Activity  Avoid making quick movements.  When you stand up from sitting in a chair, steady yourself until you feel okay.  In the morning, first sit up on the side of the bed. When you feel okay, stand slowly while you hold onto something. Do this until you know that your balance is fine.  Move your legs often if you need to stand in one place for a long time. Tighten and relax your muscles in your legs while you are standing.  Do not drive or use heavy machinery if you feel dizzy.  Avoid bending down if you feel dizzy. Place items in your home so that they are easy for you to reach without leaning over. Lifestyle  Do not use any tobacco products, including cigarettes, chewing tobacco, or electronic cigarettes. If you need help quitting, ask your doctor.  Try to lower your stress level, such as with yoga or meditation. Talk with your doctor if you need help. General Instructions  Watch your dizziness for any changes.  Take medicines only as told by your doctor. Talk with your doctor if you think that your dizziness is caused by a medicine that you are taking.  Tell a friend or a family member that you are feeling dizzy. If he or  she notices any changes in your behavior, have this person call your doctor.  Keep all follow-up visits as told by your doctor. This is important. GET HELP IF:  Your dizziness does not go away.  Your dizziness or light-headedness gets worse.  You feel sick to your stomach (nauseous).  You have trouble hearing.  You have new symptoms.  You are unsteady on your feet or you feel like the room is spinning. GET HELP RIGHT AWAY IF:  You throw up (vomit) or have diarrhea and are unable to eat or drink anything.  You have trouble:  Talking.  Walking.  Swallowing.  Using your arms, hands, or legs.  You feel generally weak.  You are not thinking clearly or you have trouble forming sentences. It may take a friend or family member to notice this.  You have:  Chest pain.  Pain in your belly (abdomen).  Shortness of breath.  Sweating.  Your vision changes.  You are bleeding.  You have a headache.  You have neck pain or a stiff neck.  You have a fever.   This information is not intended to replace advice given to you by your health care provider. Make sure you discuss any questions you have with your health care provider.   Document Released: 10/01/2011 Document Revised: 02/26/2015 Document Reviewed: 10/08/2014 Elsevier Interactive Patient Education 2016 [employer]  Urinary Tract Infection A  urinary tract infection (UTI) can occur any place along the urinary tract. The tract includes the kidneys, ureters, bladder, and urethra. A type of germ called bacteria often causes a UTI. UTIs are often helped with antibiotic medicine.  HOME CARE   If given, take antibiotics as told by your doctor. Finish them even if you start to feel better.  Drink enough fluids to keep your pee (urine) clear or pale yellow.  Avoid tea, drinks with caffeine, and bubbly (carbonated) drinks.  Pee often. Avoid holding your pee in for a long time.  Pee before and after having sex  (intercourse).  Wipe from front to back after you poop (bowel movement) if you are a woman. Use each tissue only once. GET HELP RIGHT AWAY IF:   You have back pain.  You have lower belly (abdominal) pain.  You have chills.  You feel sick to your stomach (nauseous).  You throw up (vomit).  Your burning or discomfort with peeing does not go away.  You have a fever.  Your symptoms are not better in 3 days. MAKE SURE YOU:   Understand these instructions.  Will watch your condition.  Will get help right away if you are not doing well or get worse.   This information is not intended to replace advice given to you by your health care provider. Make sure you discuss any questions you have with your health care provider.   Document Released: 03/30/2008 Document Revised: 11/02/2014 Document Reviewed: 05/12/2012 Elsevier Interactive Patient Education [employer].

## 2016-02-06 NOTE — ED Provider Notes (Signed)
CSN: [id number]     Arrival date & time 02/06/16  1744 History   None   Nurses notes were reviewed. Chief Complaint  Patient presents with  . Dizziness   Patient's here for dizziness. She saw her vascular doctors extender this morning. She states it looks like she is on to have another procedure on her left leg which she's had a stent put in. He states she went to bed before she did she took one of her husband's indication for pain in his leg. She states she is on Pravachol and she thought those reaction between his medicine and Pravachol. Her husband is on a prescription type of acetaminophen 500 mg of Pravachol according to the pharmacist [facility] should not be any type of problems with interaction. This dizziness is somewhat better but she was concerned so she came in to be seen and evaluated. She denies having chest pain shortness of breath but does state some nonspecific dizziness and wooziness as she puts it.   She has multiple medical problems hypertension diabetes anxiety UTI peripheral vascular disease mitral and aortic valvular disease she's had cardiac catheterizations even though she states is nothing wrong with her heart.     (Consider location/radiation/quality/duration/timing/severity/associated sxs/prior Treatment) Patient is a 81 y.o. female presenting with dizziness. The history is provided by the patient. No language interpreter was used.  Dizziness Quality:  Lightheadedness Severity:  Moderate Onset quality:  Sudden Timing:  Constant Progression:  Partially resolved Chronicity:  New Context: medication and physical activity   Context: not with loss of consciousness   Relieved by:  Nothing Worsened by:  Nothing Associated symptoms: no chest pain, no diarrhea, no headaches, no nausea, no palpitations, no syncope, no vision changes and no weakness   Risk factors: multiple medications and new medications     Past Medical History  Diagnosis Date  . Hypertension   .  Diabetes mellitus without complication ([place])   . Anxiety   . UTI (urinary tract infection)   . Vascular disease, peripheral ([place])   . Mitral and aortic valve disease   . Shingles    Past Surgical History  Procedure Laterality Date  . Ptca  08/2013    Left common iliac  . Ptca  12/2012    left ext iliac  . Cardiac catheterization  1998    40% LM, 95% Ramus interm   Family History  Problem Relation Age of Onset  . Dementia Mother   . Diabetes Father    Social History  Substance Use Topics  . Smoking status: Former Research scientist (life sciences)  . Smokeless tobacco: None  . Alcohol Use: 0.0 oz/week    0 Standard drinks or equivalent per week   OB History    No data available     Review of Systems  Cardiovascular: Negative for chest pain, palpitations and syncope.  Gastrointestinal: Negative for nausea and diarrhea.  Neurological: Positive for dizziness. Negative for weakness and headaches.  All other systems reviewed and are negative.   Allergies  Saxagliptin; Codeine; Ezetimibe; Nitrofurantoin; Limonene; and Sulfa antibiotics  Home Medications   Prior to Admission medications   Medication Sig Start Date End Date Taking? Authorizing Provider  aspirin 81 MG tablet Take 1 tablet by mouth daily.   Yes Historical Provider, MD  B Complex Vitamins (VITAMIN-B COMPLEX) TABS Take by mouth.   Yes Historical Provider, MD  Biotin 10 MG TABS Take by mouth.   Yes Historical Provider, MD  clopidogrel (PLAVIX) 75 MG tablet Take 1 tablet  by mouth daily.   Yes Historical Provider, MD  Cyanocobalamin (RA VITAMIN B-12 TR) 1000 MCG TBCR Take by mouth.   Yes Historical Provider, MD  diltiazem (CARTIA XT) 180 MG 24 hr capsule Take 1 tablet by mouth 2 (two) times daily.   Yes Historical Provider, MD  enalapril (VASOTEC) 20 MG tablet Take 1 tablet by mouth daily.   Yes Historical Provider, MD  glimepiride (AMARYL) 4 MG tablet TAKE 1 TABLET BY MOUTH TWICE DAILY 10/08/15  Yes [name], MD  GLUCOSE BLOOD VI     Yes Historical Provider, MD  metFORMIN (GLUCOPHAGE-XR) 500 MG 24 hr tablet TAKE 1 TABLET BY MOUTH TWICE DAILY 11/25/15  Yes [name], MD  Multiple Vitamin (MULTI-VITAMINS) TABS Take by mouth.   Yes Historical Provider, MD  Omega 3 1000 MG CAPS Take by mouth. 12/12/09  Yes Historical Provider, MD  pantoprazole (PROTONIX) 40 MG tablet Take 1 tablet (40 mg total) by mouth daily. 07/24/15  Yes [name], MD  pravastatin (PRAVACHOL) 20 MG tablet Take 1 tablet by mouth daily.   Yes Historical Provider, MD  TRADJENTA 5 MG TABS tablet TAKE 1 TABLET BY MOUTH EVERY DAY 06/04/15  Yes [name], MD  cefUROXime (CEFTIN) 250 MG tablet Take 1 tablet (250 mg total) by mouth 2 (two) times daily. 02/06/16   Frederich Cha, MD  ciprofloxacin (CIPRO) 250 MG tablet Take 1 tablet (250 mg total) by mouth 2 (two) times daily. 01/16/16   [name], MD  nitroGLYCERIN (NITROSTAT) 0.4 MG SL tablet 1 tab under the tongue for CP, may repeat in 5 minutes up to 3 doses 06/20/13   Historical Provider, MD   Meds Ordered and Administered this Visit  Medications - No data to display  BP 155/50 mmHg  Pulse 59  Temp(Src) 98.1 F (36.7 C) (Oral)  Resp 18  Ht 5\' 1"  (1.549 m)  Wt 122 lb (55.339 kg)  BMI 23.06 kg/m2  SpO2 99% Orthostatic VS for the past 24 hrs:  BP- Lying Pulse- Lying BP- Sitting Pulse- Sitting BP- Standing at 0 minutes Pulse- Standing at 0 minutes  02/06/16 1933 161/49 mmHg 55 168/53 mmHg 54 162/51 mmHg 56    Physical Exam  Constitutional: She is oriented to person, place, and time. She appears well-developed and well-nourished.  HENT:  Head: Normocephalic and atraumatic.  Eyes: Conjunctivae are normal. Pupils are equal, round, and reactive to light.  Neck: Normal range of motion. Neck supple.  Cardiovascular: Normal rate.  Exam reveals distant heart sounds. Exam reveals no friction rub.   Pulmonary/Chest: Effort normal and breath sounds normal.  Musculoskeletal: Normal range of  motion.  Neurological: She is alert and oriented to person, place, and time.  Skin: Skin is warm and dry.  Psychiatric: She has a normal mood and affect.  Vitals reviewed.   ED Course  Procedures (including critical care time)  Labs Review Labs Reviewed  CBC WITH DIFFERENTIAL/PLATELET - Abnormal; Notable for the following:    Hemoglobin 11.2 (*)    HCT 34.0 (*)    All other components within normal limits  BASIC METABOLIC PANEL - Abnormal; Notable for the following:    Sodium 132 (*)    Potassium 5.5 (*)    Glucose, Bld 171 (*)    BUN 27 (*)    Creatinine, Ser 1.49 (*)    GFR calc non Af Amer 32 (*)    GFR calc Af Amer 37 (*)    All  other components within normal limits  URINALYSIS COMPLETEWITH MICROSCOPIC ([facility] ONLY) - Abnormal; Notable for the following:    Ketones, ur TRACE (*)    Leukocytes, UA 3+ (*)    Bacteria, UA MANY (*)    All other components within normal limits  URINE CULTURE    Imaging Review No results found.   Visual Acuity Review  Right Eye Distance:   Left Eye Distance:   Bilateral Distance:    Right Eye Near:   Left Eye Near:    Bilateral Near:      Results for orders placed or performed during the hospital encounter of 02/06/16  CBC with Differential  Result Value Ref Range   WBC 9.0 3.6 - 11.0 K/uL   RBC 3.88 3.80 - 5.20 MIL/uL   Hemoglobin 11.2 (L) 12.0 - 16.0 g/dL   HCT 34.0 (L) 35.0 - 47.0 %   MCV 87.5 80.0 - 100.0 fL   MCH 28.9 26.0 - 34.0 pg   MCHC 33.0 32.0 - 36.0 g/dL   RDW 13.2 11.5 - 14.5 %   Platelets 268 150 - 440 K/uL   Neutrophils Relative % 66 %   Neutro Abs 6.0 1.4 - 6.5 K/uL   Lymphocytes Relative 20 %   Lymphs Abs 1.8 1.0 - 3.6 K/uL   Monocytes Relative 8 %   Monocytes Absolute 0.7 0.2 - 0.9 K/uL   Eosinophils Relative 4 %   Eosinophils Absolute 0.3 0 - 0.7 K/uL   Basophils Relative 2 %   Basophils Absolute 0.1 0 - 0.1 K/uL  Basic metabolic panel  Result Value Ref Range   Sodium 132 (L) 135 - 145 mmol/L    Potassium 5.5 (H) 3.5 - 5.1 mmol/L   Chloride 103 101 - 111 mmol/L   CO2 23 22 - 32 mmol/L   Glucose, Bld 171 (H) 65 - 99 mg/dL   BUN 27 (H) 6 - 20 mg/dL   Creatinine, Ser 1.49 (H) 0.44 - 1.00 mg/dL   Calcium 9.3 8.9 - 10.3 mg/dL   GFR calc non Af Amer 32 (L) >60 mL/min   GFR calc Af Amer 37 (L) >60 mL/min   Anion gap 6 5 - 15  Urinalysis complete, with microscopic  Result Value Ref Range   Color, Urine YELLOW YELLOW   APPearance CLEAR CLEAR   Glucose, UA NEGATIVE NEGATIVE mg/dL   Bilirubin Urine NEGATIVE NEGATIVE   Ketones, ur TRACE (A) NEGATIVE mg/dL   Specific Gravity, Urine 1.015 1.005 - 1.030   Hgb urine dipstick NEGATIVE NEGATIVE   pH 5.5 5.0 - 8.0   Protein, ur NEGATIVE NEGATIVE mg/dL   Nitrite NEGATIVE NEGATIVE   Leukocytes, UA 3+ (A) NEGATIVE   RBC / HPF 0-5 0 - 5 RBC/hpf   WBC, UA TOO NUMEROUS TO COUNT 0 - 5 WBC/hpf   Bacteria, UA MANY (A) NONE SEEN   Squamous Epithelial / LPF NONE SEEN NONE SEEN   Urine-Other LESS THAN 10 mL OF URINE SUBMITTED      MDM   1. UTI (lower urinary tract infection)   2. Dizzy      Orthostatic vital signs were normal. EKG shows sinus bradycardia at 55. Blood work essentially normal. In investigating this medication of her husband's Tylenol should not react it with the Pravachol. The only thing found significant was the patient has a UTI. One question turns out the patient had a UTI which she saw Dr. Army Melia her PCP about 2 weeks ago. Apparently she was placed  on Cipro but only took a few dosages when she felt she was having a side effect from the Cipro and her rash and irritation. She stopped Cipro apparently she that was a mistake. She is allergic to Macrodantin and some other medications. We'll try Ceftin 250 one tablet a day twice a day for 7 days and will culture the urine.  Follow-up Dr. Army Melia as needed.   ED ECG REPORT I, Durell Lofaso H, the attending physician, personally viewed and interpreted this ECG.   Date:  02/06/2016  EKG Time:19:19:24  Rate:55  Rhythm: there are no previous tracings available for comparison, sinus bradycardia  Axis: 64  Intervals:none  ST&T Change: none   Note: This dictation was prepared with Dragon dictation along with smaller phrase technology. Any transcriptional errors that result from this process are unintentional.   Frederich Cha, MD 02/06/16 2020

## 2016-02-06 NOTE — ED Notes (Signed)
Patient took Q-PAP (her husbands medication) and started feeling dizzy. Patient tried the medication due to pain in her left leg.

## 2016-02-08 LAB — URINE CULTURE: Special Requests: NORMAL

## 2016-02-18 ENCOUNTER — Other Ambulatory Visit: Payer: Self-pay | Admitting: Vascular Surgery

## 2016-02-18 DIAGNOSIS — D1722 Benign lipomatous neoplasm of skin and subcutaneous tissue of left arm: Secondary | ICD-10-CM | POA: Diagnosis not present

## 2016-02-21 ENCOUNTER — Other Ambulatory Visit
Admission: RE | Admit: 2016-02-21 | Discharge: 2016-02-21 | Disposition: A | Discharge status: Home / Self Care | Payer: Medicare Other | Source: Ambulatory Visit | Attending: Vascular Surgery | Admitting: Vascular Surgery

## 2016-02-21 DIAGNOSIS — I739 Peripheral vascular disease, unspecified: Secondary | ICD-10-CM | POA: Insufficient documentation

## 2016-02-21 DIAGNOSIS — I6523 Occlusion and stenosis of bilateral carotid arteries: Secondary | ICD-10-CM | POA: Insufficient documentation

## 2016-02-21 LAB — CREATININE, SERUM
Creatinine, Ser: 1.28 mg/dL — ABNORMAL HIGH (ref 0.44–1.00)
GFR calc Af Amer: 45 mL/min — ABNORMAL LOW (ref >60)
GFR calc non Af Amer: 39 mL/min — ABNORMAL LOW (ref >60)

## 2016-02-21 LAB — BUN: BUN: 27 mg/dL — AB (ref 6–20)

## 2016-02-25 ENCOUNTER — Encounter: Admission: RE | Payer: Self-pay | Source: Ambulatory Visit

## 2016-02-25 ENCOUNTER — Ambulatory Visit: Admission: RE | Admit: 2016-02-25 | Payer: Medicare Other | Source: Ambulatory Visit | Admitting: Vascular Surgery

## 2016-02-25 SURGERY — LOWER EXTREMITY ANGIOGRAPHY
Anesthesia: Moderate Sedation | Laterality: Left

## 2016-04-07 ENCOUNTER — Ambulatory Visit (INDEPENDENT_AMBULATORY_CARE_PROVIDER_SITE_OTHER): Payer: Medicare Other | Admitting: Internal Medicine

## 2016-04-07 ENCOUNTER — Encounter: Payer: Self-pay | Admitting: Internal Medicine

## 2016-04-07 VITALS — BP 140/68 | HR 64 | Temp 97.8°F | Resp 16 | Ht 61.0 in | Wt 123.0 lb

## 2016-04-07 DIAGNOSIS — IMO0002 Reserved for concepts with insufficient information to code with codable children: Secondary | ICD-10-CM

## 2016-04-07 DIAGNOSIS — E1142 Type 2 diabetes mellitus with diabetic polyneuropathy: Secondary | ICD-10-CM | POA: Diagnosis not present

## 2016-04-07 DIAGNOSIS — I739 Peripheral vascular disease, unspecified: Secondary | ICD-10-CM | POA: Diagnosis not present

## 2016-04-07 DIAGNOSIS — N390 Urinary tract infection, site not specified: Secondary | ICD-10-CM | POA: Diagnosis not present

## 2016-04-07 DIAGNOSIS — E1165 Type 2 diabetes mellitus with hyperglycemia: Secondary | ICD-10-CM | POA: Diagnosis not present

## 2016-04-07 MED ORDER — CIPROFLOXACIN HCL 250 MG PO TABS: 250.0000 mg | ORAL_TABLET | Freq: Two times a day (BID) | ORAL | Status: DC

## 2016-04-07 NOTE — Patient Instructions (Signed)
In place of Tradjenta:   Try Januvia 100 mg once a day.  If Januvia not tolerated, try Nesina 25 mg once a day.

## 2016-04-07 NOTE — Progress Notes (Signed)
Date:  04/07/2016   Name:  Jill Hudson   DOB:  1934/11/04   MRN:  [id number]   Chief Complaint: Urinary Tract Infection Urinary Tract Infection  This is a new problem. The current episode started in the past 7 days. The problem occurs intermittently. The patient is experiencing no pain. There has been no fever. Associated symptoms include frequency. Pertinent negatives include no chills, hematuria, nausea or vomiting. Associated symptoms comments: Fatigue and elevated blood sugar.  Diabetes She presents for her follow-up diabetic visit. She has type 2 diabetes mellitus. Her disease course has been fluctuating. Associated symptoms include fatigue. Pertinent negatives for diabetes include no chest pain. Diabetic complications include PVD. Current diabetic treatment includes oral agent (triple therapy) (5 mg tradjenta causes back ache so only taking  1/2).    Lab Results  Component Value Date   HGBA1C 8.3* 01/16/2016     Review of Systems  Constitutional: Positive for fatigue. Negative for fever and chills.  Respiratory: Negative for chest tightness and shortness of breath.   Cardiovascular: Negative for chest pain and palpitations.  Gastrointestinal: Negative for nausea, vomiting and abdominal pain.  Genitourinary: Positive for frequency. Negative for dysuria, hematuria and pelvic pain.  Musculoskeletal: Positive for back pain.    Patient Active Problem List   Diagnosis Date Noted  . Elevated TSH 01/18/2016  . Chronic renal insufficiency, stage III (moderate) 01/16/2016  . Senile ecchymosis 01/16/2016  . Uncontrolled type 2 diabetes mellitus with diabetic polyneuropathy, without long-term current use of insulin ([place]) 09/16/2015  . GERD (gastroesophageal reflux disease) 07/24/2015  . Peripheral blood vessel disorder ([place]) 05/17/2015  . Neoplasm of uncertain behavior of skin 05/17/2015  . Background retinopathy due to secondary diabetes ([place]) 04/11/2015  . Diabetes mellitus with  polyneuropathy ([place]) 04/11/2015  . HLD (hyperlipidemia) 04/11/2015  . Essential (primary) hypertension 04/11/2015  . Generalized OA 04/11/2015  . Arterial insufficiency ([place]) 04/11/2015  . Arteriosclerosis of coronary artery 05/29/2013  . Hypertensive heart disease without CHF 05/29/2013    Prior to Admission medications   Medication Sig Start Date End Date Taking? Authorizing Provider  aspirin 81 MG tablet Take 1 tablet by mouth daily.    Historical Provider, MD  B Complex Vitamins (VITAMIN-B COMPLEX) TABS Take by mouth.    Historical Provider, MD  Biotin 10 MG TABS Take by mouth.    Historical Provider, MD  cefUROXime (CEFTIN) 250 MG tablet Take 1 tablet (250 mg total) by mouth 2 (two) times daily. 02/06/16   Frederich Cha, MD  ciprofloxacin (CIPRO) 250 MG tablet Take 1 tablet (250 mg total) by mouth 2 (two) times daily. 01/16/16   [name], MD  clopidogrel (PLAVIX) 75 MG tablet Take 1 tablet by mouth daily.    Historical Provider, MD  Cyanocobalamin (RA VITAMIN B-12 TR) 1000 MCG TBCR Take by mouth.    Historical Provider, MD  diltiazem (CARTIA XT) 180 MG 24 hr capsule Take 1 tablet by mouth 2 (two) times daily.    Historical Provider, MD  enalapril (VASOTEC) 20 MG tablet Take 1 tablet by mouth daily.    Historical Provider, MD  glimepiride (AMARYL) 4 MG tablet TAKE 1 TABLET BY MOUTH TWICE DAILY 10/08/15   [name], MD  GLUCOSE BLOOD VI     Historical Provider, MD  metFORMIN (GLUCOPHAGE-XR) 500 MG 24 hr tablet TAKE 1 TABLET BY MOUTH TWICE DAILY 11/25/15   [name], MD  Multiple Vitamin (MULTI-VITAMINS) TABS Take by mouth.  Historical Provider, MD  nitroGLYCERIN (NITROSTAT) 0.4 MG SL tablet 1 tab under the tongue for CP, may repeat in 5 minutes up to 3 doses 06/20/13   Historical Provider, MD  Omega 3 1000 MG CAPS Take by mouth. 12/12/09   Historical Provider, MD  pantoprazole (PROTONIX) 40 MG tablet Take 1 tablet (40 mg total) by mouth daily. 07/24/15   [name],  MD  pravastatin (PRAVACHOL) 20 MG tablet Take 1 tablet by mouth daily.    Historical Provider, MD  TRADJENTA 5 MG TABS tablet TAKE 1 TABLET BY MOUTH EVERY DAY 06/04/15   [name], MD    Allergies  Allergen Reactions  . Saxagliptin Diarrhea  . Codeine     Upset stomach  . Ezetimibe Other (See Comments)    myalgias  . Nitrofurantoin     Pruitus, rash  . Limonene Rash  . Sulfa Antibiotics Rash and Other (See Comments)    Sore mouth     Past Surgical History  Procedure Laterality Date  . Ptca  08/2013    Left common iliac  . Ptca  12/2012    left ext iliac  . Cardiac catheterization  1998    40% LM, 95% Ramus interm    Social History  Substance Use Topics  . Smoking status: Former Research scientist (life sciences)  . Smokeless tobacco: None  . Alcohol Use: 0.0 oz/week    0 Standard drinks or equivalent per week     Medication list has been reviewed and updated.   Physical Exam  Constitutional: She is oriented to person, place, and time. She appears well-developed. No distress.  HENT:  Head: Normocephalic and atraumatic.  Neck: Normal range of motion.  Cardiovascular: Normal rate, regular rhythm and normal heart sounds.   Left lower leg perfused but cool. Right lower leg perfused and warm.  Pulmonary/Chest: Effort normal and breath sounds normal. No respiratory distress.  Abdominal: She exhibits no distension and no mass. There is no tenderness. There is no rebound.  Musculoskeletal: Normal range of motion. She exhibits no edema.  Neurological: She is alert and oriented to person, place, and time.  Skin: Skin is warm and dry. No rash noted.  Psychiatric: She has a normal mood and affect. Her behavior is normal. Thought content normal.  Nursing note and vitals reviewed.   BP 140/68 mmHg  Pulse 64  Temp(Src) 97.8 F (36.6 C) (Oral)  Resp 16  Ht 5\' 1"  (1.549 m)  Wt 123 lb (55.792 kg)  BMI 23.25 kg/m2  SpO2 99%  Assessment and Plan: 1. Urinary tract infection without hematuria,  site unspecified Continue adequate fluids Follow up if no improvement - ciprofloxacin (CIPRO) 250 MG tablet; Take 1 tablet (250 mg total) by mouth 2 (two) times daily.  Dispense: 14 tablet; Refill: 0  2. Uncontrolled type 2 diabetes mellitus with diabetic polyneuropathy, without long-term current use of insulin ([place]) Hold tradjenta; samples of Januvia and Nesina given  3. Peripheral blood vessel disorder ([place]) Follow up with Vascular surgery as scheduled  [name], MD [facility]  04/07/2016

## 2016-04-09 DIAGNOSIS — E119 Type 2 diabetes mellitus without complications: Secondary | ICD-10-CM | POA: Diagnosis not present

## 2016-04-09 DIAGNOSIS — I739 Peripheral vascular disease, unspecified: Secondary | ICD-10-CM | POA: Diagnosis not present

## 2016-04-09 DIAGNOSIS — E785 Hyperlipidemia, unspecified: Secondary | ICD-10-CM | POA: Diagnosis not present

## 2016-04-09 DIAGNOSIS — I2510 Atherosclerotic heart disease of native coronary artery without angina pectoris: Secondary | ICD-10-CM | POA: Diagnosis not present

## 2016-04-09 DIAGNOSIS — I10 Essential (primary) hypertension: Secondary | ICD-10-CM | POA: Diagnosis not present

## 2016-04-23 ENCOUNTER — Telehealth: Payer: Self-pay

## 2016-04-23 NOTE — Telephone Encounter (Signed)
Advise her to come in tomorrow for UA and A1C check.

## 2016-04-23 NOTE — Telephone Encounter (Signed)
Had uti 04/07/2016. May have another one. Also having BS readings from 100-400.Advised OV. Denies SOB or dizzy but does have tiredness. I advised her to be seen asap. Do  You feel this is safe to wait until tomorrow?

## 2016-04-24 ENCOUNTER — Ambulatory Visit (INDEPENDENT_AMBULATORY_CARE_PROVIDER_SITE_OTHER): Payer: Medicare Other | Admitting: Internal Medicine

## 2016-04-24 ENCOUNTER — Encounter: Payer: Self-pay | Admitting: Internal Medicine

## 2016-04-24 VITALS — BP 120/52 | HR 56 | Resp 16 | Ht 61.5 in | Wt 125.0 lb

## 2016-04-24 DIAGNOSIS — E1142 Type 2 diabetes mellitus with diabetic polyneuropathy: Secondary | ICD-10-CM

## 2016-04-24 DIAGNOSIS — N3000 Acute cystitis without hematuria: Secondary | ICD-10-CM

## 2016-04-24 DIAGNOSIS — E1165 Type 2 diabetes mellitus with hyperglycemia: Secondary | ICD-10-CM

## 2016-04-24 DIAGNOSIS — R739 Hyperglycemia, unspecified: Secondary | ICD-10-CM

## 2016-04-24 DIAGNOSIS — IMO0002 Reserved for concepts with insufficient information to code with codable children: Secondary | ICD-10-CM

## 2016-04-24 DIAGNOSIS — R7309 Other abnormal glucose: Secondary | ICD-10-CM

## 2016-04-24 LAB — GLUCOSE, POCT (MANUAL RESULT ENTRY): POC GLUCOSE: 310 mg/dL — AB (ref 70–99)

## 2016-04-24 LAB — POCT URINALYSIS DIPSTICK
BILIRUBIN UA: NEGATIVE
Glucose, UA: NEGATIVE
KETONES UA: NEGATIVE
Leukocytes, UA: NEGATIVE
NITRITE UA: NEGATIVE
PH UA: 6
RBC UA: NEGATIVE
SPEC GRAV UA: 1.02
Urobilinogen, UA: 0.2

## 2016-04-24 MED ORDER — "PEN NEEDLES 5/16"" 30G X 8 MM MISC": 1.0000 | Freq: Every day | Status: DC

## 2016-04-24 MED ORDER — INSULIN GLARGINE 100 UNIT/ML SOLOSTAR PEN: 4.0000 [IU] | PEN_INJECTOR | Freq: Every day | SUBCUTANEOUS | Status: DC

## 2016-04-24 NOTE — Patient Instructions (Signed)
Begin Lantus 4 units in the morning  Stop Januvia, etc  Continue glimepiride but stop it if blood sugars get too low.

## 2016-04-24 NOTE — Progress Notes (Signed)
Date:  04/24/2016   Name:  Jill Hudson   DOB:  30-Nov-1934   MRN:  [id number]   Chief Complaint: Diabetes Diabetes She presents for her follow-up diabetic visit. She has type 2 diabetes mellitus. Her disease course has been fluctuating. Pertinent negatives for hypoglycemia include no dizziness or headaches. Pertinent negatives for diabetes include no chest pain and no fatigue. Symptoms are worsening (worse on tradjenta/januvia).  Urinary Tract Infection  This is a recurrent problem. There is no history of pyelonephritis. Pertinent negatives include no chills or hematuria. Associated symptoms comments: Elevated blood sugar. Treatments tried: treated with Ciproflox recently for previous culture E Coli done in april.  Not sure if infection is gone.    Lab Results  Component Value Date   HGBA1C 8.3* 01/16/2016    Review of Systems  Constitutional: Negative for fever, chills and fatigue.  Respiratory: Negative for chest tightness and shortness of breath.   Cardiovascular: Negative for chest pain and palpitations.  Genitourinary: Negative for dysuria, hematuria and decreased urine volume.  Skin: Negative for color change and rash.  Neurological: Negative for dizziness and headaches.    Patient Active Problem List   Diagnosis Date Noted  . Elevated TSH 01/18/2016  . Chronic renal insufficiency, stage III (moderate) 01/16/2016  . Senile ecchymosis 01/16/2016  . Uncontrolled type 2 diabetes mellitus with diabetic polyneuropathy, without long-term current use of insulin ([place]) 09/16/2015  . GERD (gastroesophageal reflux disease) 07/24/2015  . Peripheral blood vessel disorder ([place]) 05/17/2015  . Neoplasm of uncertain behavior of skin 05/17/2015  . Background retinopathy due to secondary diabetes ([place]) 04/11/2015  . Diabetes mellitus with polyneuropathy ([place]) 04/11/2015  . HLD (hyperlipidemia) 04/11/2015  . Essential (primary) hypertension 04/11/2015  . Generalized OA 04/11/2015  .  Arterial insufficiency ([place]) 04/11/2015  . Arteriosclerosis of coronary artery 05/29/2013  . Hypertensive heart disease without CHF 05/29/2013    Prior to Admission medications   Medication Sig Start Date End Date Taking? Authorizing Provider  aspirin 81 MG tablet Take 1 tablet by mouth daily.   Yes Historical Provider, MD  B Complex Vitamins (VITAMIN-B COMPLEX) TABS Take by mouth.   Yes Historical Provider, MD  Biotin 10 MG TABS Take by mouth.   Yes Historical Provider, MD  cefUROXime (CEFTIN) 250 MG tablet Take 1 tablet (250 mg total) by mouth 2 (two) times daily. 02/06/16  Yes Frederich Cha, MD  clopidogrel (PLAVIX) 75 MG tablet Take 1 tablet by mouth daily.   Yes Historical Provider, MD  Cyanocobalamin (RA VITAMIN B-12 TR) 1000 MCG TBCR Take by mouth.   Yes Historical Provider, MD  diltiazem (CARTIA XT) 180 MG 24 hr capsule Take 1 tablet by mouth 2 (two) times daily.   Yes Historical Provider, MD  enalapril (VASOTEC) 20 MG tablet Take 1 tablet by mouth daily.   Yes Historical Provider, MD  glimepiride (AMARYL) 4 MG tablet TAKE 1 TABLET BY MOUTH TWICE DAILY 10/08/15  Yes [name], MD  GLUCOSE BLOOD VI    Yes Historical Provider, MD  metFORMIN (GLUCOPHAGE-XR) 500 MG 24 hr tablet TAKE 1 TABLET BY MOUTH TWICE DAILY 11/25/15  Yes [name], MD  Multiple Vitamin (MULTI-VITAMINS) TABS Take by mouth.   Yes Historical Provider, MD  nitroGLYCERIN (NITROSTAT) 0.4 MG SL tablet 1 tab under the tongue for CP, may repeat in 5 minutes up to 3 doses 06/20/13  Yes Historical Provider, MD  Omega 3 1000 MG CAPS Take by mouth. 12/12/09  Yes Historical  Provider, MD  pantoprazole (PROTONIX) 40 MG tablet Take 1 tablet (40 mg total) by mouth daily. 07/24/15  Yes [name], MD  pravastatin (PRAVACHOL) 20 MG tablet  02/13/16  Yes Historical Provider, MD  TRADJENTA 5 MG TABS tablet TAKE 1 TABLET BY MOUTH EVERY DAY 06/04/15  Yes [name], MD    Allergies  Allergen Reactions  . Saxagliptin  Diarrhea  . Codeine     Upset stomach  . Ezetimibe Other (See Comments)    myalgias  . Nitrofurantoin     Pruitus, rash  . Limonene Rash  . Sulfa Antibiotics Rash and Other (See Comments)    Sore mouth     Past Surgical History  Procedure Laterality Date  . Ptca  08/2013    Left common iliac  . Ptca  12/2012    left ext iliac  . Cardiac catheterization  1998    40% LM, 95% Ramus interm    Social History  Substance Use Topics  . Smoking status: Former Research scientist (life sciences)  . Smokeless tobacco: None  . Alcohol Use: 0.0 oz/week    0 Standard drinks or equivalent per week     Medication list has been reviewed and updated.   Physical Exam  Constitutional: She is oriented to person, place, and time. She appears well-developed and well-nourished. No distress.  HENT:  Head: Normocephalic and atraumatic.  Neck: Normal range of motion. Neck supple.  Cardiovascular: Normal rate, regular rhythm and normal heart sounds.   Pulmonary/Chest: Effort normal and breath sounds normal. No respiratory distress.  Musculoskeletal: Normal range of motion. She exhibits tenderness. She exhibits no edema.  Neurological: She is alert and oriented to person, place, and time.  Skin: Skin is warm and dry. No rash noted.  Psychiatric: She has a normal mood and affect. Her behavior is normal. Thought content normal.    BP 120/52 mmHg  Pulse 56  Resp 16  Ht 5' 1.5" (1.562 m)  Wt 125 lb (56.7 kg)  BMI 23.24 kg/m2  SpO2 99%  Assessment and Plan: 1. Elevated blood sugar Running much higher so will stop DPP-4 meds Continue glimepiride and metformin - POCT Glucose (CBG)  2. Uncontrolled type 2 diabetes mellitus with diabetic polyneuropathy, without long-term current use of insulin ([facility]) Begin insulin - - Insulin Glargine (LANTUS SOLOSTAR) 100 UNIT/ML Solostar Pen; Inject 4-10 Units into the skin daily at 10 pm.  Dispense: 5 pen; Refill: PRN - Insulin Pen Needle (PEN NEEDLES 5/16") 30G X 8 MM MISC; 1 each  by Does not apply route daily.  Dispense: 100 each; Refill: 3  3. Acute cystitis without hematuria UA clear today   [name], MD [facility]  04/24/2016

## 2016-05-06 ENCOUNTER — Telehealth: Payer: Self-pay | Admitting: Internal Medicine

## 2016-05-06 NOTE — Telephone Encounter (Signed)
Called patient regarding persistent high blood sugars since starting insulin.  She stopped glimepiride and is now taking Lantus 6 units daily. Blood sugar last evening 320.  BS this AM 200. She is advised to resume 2 mg glimepiride in the AM and continue slow titration of insulin - increase by 2 units every 4-5 days as long as BS stays over 160.

## 2016-05-14 DIAGNOSIS — I872 Venous insufficiency (chronic) (peripheral): Secondary | ICD-10-CM | POA: Diagnosis not present

## 2016-05-14 DIAGNOSIS — M79609 Pain in unspecified limb: Secondary | ICD-10-CM | POA: Diagnosis not present

## 2016-05-14 DIAGNOSIS — I8311 Varicose veins of right lower extremity with inflammation: Secondary | ICD-10-CM | POA: Diagnosis not present

## 2016-05-14 DIAGNOSIS — I10 Essential (primary) hypertension: Secondary | ICD-10-CM | POA: Diagnosis not present

## 2016-05-14 DIAGNOSIS — I6523 Occlusion and stenosis of bilateral carotid arteries: Secondary | ICD-10-CM | POA: Diagnosis not present

## 2016-05-14 DIAGNOSIS — E785 Hyperlipidemia, unspecified: Secondary | ICD-10-CM | POA: Diagnosis not present

## 2016-05-14 DIAGNOSIS — E119 Type 2 diabetes mellitus without complications: Secondary | ICD-10-CM | POA: Diagnosis not present

## 2016-05-14 DIAGNOSIS — I70219 Atherosclerosis of native arteries of extremities with intermittent claudication, unspecified extremity: Secondary | ICD-10-CM | POA: Diagnosis not present

## 2016-05-14 DIAGNOSIS — I8312 Varicose veins of left lower extremity with inflammation: Secondary | ICD-10-CM | POA: Diagnosis not present

## 2016-05-14 DIAGNOSIS — I70212 Atherosclerosis of native arteries of extremities with intermittent claudication, left leg: Secondary | ICD-10-CM | POA: Diagnosis not present

## 2016-05-14 DIAGNOSIS — I70213 Atherosclerosis of native arteries of extremities with intermittent claudication, bilateral legs: Secondary | ICD-10-CM | POA: Diagnosis not present

## 2016-05-14 DIAGNOSIS — I2510 Atherosclerotic heart disease of native coronary artery without angina pectoris: Secondary | ICD-10-CM | POA: Diagnosis not present

## 2016-05-18 ENCOUNTER — Ambulatory Visit (INDEPENDENT_AMBULATORY_CARE_PROVIDER_SITE_OTHER): Payer: Medicare Other | Admitting: Internal Medicine

## 2016-05-18 ENCOUNTER — Encounter: Payer: Self-pay | Admitting: Internal Medicine

## 2016-05-18 ENCOUNTER — Encounter (INDEPENDENT_AMBULATORY_CARE_PROVIDER_SITE_OTHER): Payer: Self-pay

## 2016-05-18 VITALS — BP 120/70 | HR 62 | Ht 61.0 in | Wt 123.0 lb

## 2016-05-18 DIAGNOSIS — M7071 Other bursitis of hip, right hip: Secondary | ICD-10-CM

## 2016-05-18 DIAGNOSIS — M7070 Other bursitis of hip, unspecified hip: Secondary | ICD-10-CM | POA: Insufficient documentation

## 2016-05-18 DIAGNOSIS — E133299 Other specified diabetes mellitus with mild nonproliferative diabetic retinopathy without macular edema, unspecified eye: Secondary | ICD-10-CM

## 2016-05-18 DIAGNOSIS — I10 Essential (primary) hypertension: Secondary | ICD-10-CM | POA: Diagnosis not present

## 2016-05-18 DIAGNOSIS — E1165 Type 2 diabetes mellitus with hyperglycemia: Secondary | ICD-10-CM | POA: Diagnosis not present

## 2016-05-18 DIAGNOSIS — E1142 Type 2 diabetes mellitus with diabetic polyneuropathy: Secondary | ICD-10-CM | POA: Diagnosis not present

## 2016-05-18 DIAGNOSIS — IMO0002 Reserved for concepts with insufficient information to code with codable children: Secondary | ICD-10-CM

## 2016-05-18 DIAGNOSIS — E13319 Other specified diabetes mellitus with unspecified diabetic retinopathy without macular edema: Secondary | ICD-10-CM

## 2016-05-18 NOTE — Progress Notes (Signed)
Date:  05/18/2016   Name:  Jill Hudson   DOB:  [birth year]   MRN:  [id number]   Chief Complaint: Diabetes and Leg Pain ("had a leg twist, almost fell, in the spring"- leg has bothered her since then, especially when trying to turn her R) leg) Diabetes  She presents for her follow-up diabetic visit. She has type 2 diabetes mellitus. Her disease course has been stable. Pertinent negatives for diabetes include no chest pain and no fatigue. There are no hypoglycemic complications. Symptoms are improving. Current diabetic treatment includes insulin injections and oral agent (dual therapy). She is compliant with treatment all of the time. Her weight is stable. She is following a generally healthy diet. Her home blood glucose trend is decreasing steadily. Her breakfast blood glucose is taken between 7-8 am. Her breakfast blood glucose range is generally 140-180 mg/dl.  Hip Pain   The incident occurred more than 1 week ago. The incident occurred at home. The injury mechanism was a twisting injury. The pain is present in the right hip.  The incident occurred at home - she meant to move her whole leg but her foot remained planted and only her hip and upper body moved.  It has been sore ever since - often feels stiff after sitting a while.  More discomfort with prolonged walking in the lateral hip rather than anterior joint and groin.  She takes tylenol three times a day but has not used rubs, ice or heat on her hip.    Review of Systems  Constitutional: Negative for appetite change, fatigue and fever.  Respiratory: Negative for cough, chest tightness and shortness of breath.   Cardiovascular: Negative for chest pain, palpitations and leg swelling.  Gastrointestinal: Negative for abdominal pain.  Genitourinary: Negative for dysuria and hematuria.  Musculoskeletal: Positive for arthralgias and myalgias. Negative for back pain, gait problem and joint swelling.  Skin: Negative for rash.    Psychiatric/Behavioral: Negative for dysphoric mood and sleep disturbance.    Patient Active Problem List   Diagnosis Date Noted  . Elevated TSH 01/18/2016  . Chronic renal insufficiency, stage III (moderate) 01/16/2016  . Senile ecchymosis 01/16/2016  . Uncontrolled type 2 diabetes mellitus with diabetic polyneuropathy, without long-term current use of insulin ([place]) 09/16/2015  . GERD (gastroesophageal reflux disease) 07/24/2015  . Peripheral blood vessel disorder ([place]) 05/17/2015  . Neoplasm of uncertain behavior of skin 05/17/2015  . Background retinopathy due to secondary diabetes ([place]) 04/11/2015  . Diabetes mellitus with polyneuropathy ([place]) 04/11/2015  . HLD (hyperlipidemia) 04/11/2015  . Essential (primary) hypertension 04/11/2015  . Generalized OA 04/11/2015  . Arterial insufficiency ([place]) 04/11/2015  . Arteriosclerosis of coronary artery 05/29/2013  . Hypertensive heart disease without CHF 05/29/2013    Prior to Admission medications   Medication Sig Start Date End Date Taking? Authorizing Provider  aspirin 81 MG tablet Take 1 tablet by mouth daily.   Yes Historical Provider, MD  B Complex Vitamins (VITAMIN-B COMPLEX) TABS Take by mouth.   Yes Historical Provider, MD  Biotin 10 MG TABS Take by mouth.   Yes Historical Provider, MD  clopidogrel (PLAVIX) 75 MG tablet Take 1 tablet by mouth daily.   Yes Historical Provider, MD  Cyanocobalamin (RA VITAMIN B-12 TR) 1000 MCG TBCR Take by mouth.   Yes Historical Provider, MD  diltiazem (CARTIA XT) 180 MG 24 hr capsule Take 1 tablet by mouth 2 (two) times daily.   Yes Historical Provider, MD  enalapril (VASOTEC) 20 MG  tablet Take 1 tablet by mouth daily.   Yes Historical Provider, MD  glimepiride (AMARYL) 4 MG tablet TAKE 1 TABLET BY MOUTH TWICE DAILY 10/08/15  Yes [name], MD  GLUCOSE BLOOD VI    Yes Historical Provider, MD  Insulin Glargine (LANTUS SOLOSTAR) 100 UNIT/ML Solostar Pen Inject 4-10 Units into the skin daily  at 10 pm. 04/24/16  Yes [name], MD  Insulin Pen Needle (PEN NEEDLES 5/16") 30G X 8 MM MISC 1 each by Does not apply route daily. 04/24/16  Yes [name], MD  metFORMIN (GLUCOPHAGE-XR) 500 MG 24 hr tablet TAKE 1 TABLET BY MOUTH TWICE DAILY 11/25/15  Yes [name], MD  nitroGLYCERIN (NITROSTAT) 0.4 MG SL tablet 1 tab under the tongue for CP, may repeat in 5 minutes up to 3 doses 06/20/13  Yes Historical Provider, MD  Omega 3 1000 MG CAPS Take by mouth. 12/12/09  Yes Historical Provider, MD  pantoprazole (PROTONIX) 40 MG tablet Take 1 tablet (40 mg total) by mouth daily. 07/24/15  Yes [name], MD  pravastatin (PRAVACHOL) 20 MG tablet  02/13/16  Yes Historical Provider, MD  cefUROXime (CEFTIN) 250 MG tablet Take 1 tablet (250 mg total) by mouth 2 (two) times daily. Patient not taking: Reported on 05/18/2016 02/06/16   Frederich Cha, MD  Multiple Vitamin (MULTI-VITAMINS) TABS Take by mouth.    Historical Provider, MD    Allergies  Allergen Reactions  . Saxagliptin Diarrhea  . Codeine     Upset stomach  . Ezetimibe Other (See Comments)    myalgias  . Nitrofurantoin     Pruitus, rash  . Limonene Rash  . Sulfa Antibiotics Rash and Other (See Comments)    Sore mouth     Past Surgical History:  Procedure Laterality Date  . CARDIAC CATHETERIZATION  1998   40% LM, 95% Ramus interm  . PTCA  08/2013   Left common iliac  . PTCA  12/2012   left ext iliac    Social History  Substance Use Topics  . Smoking status: Former Research scientist (life sciences)  . Smokeless tobacco: Not on file  . Alcohol use 0.0 oz/week     Medication list has been reviewed and updated.   Physical Exam  Constitutional: She is oriented to person, place, and time. She appears well-developed. No distress.  HENT:  Head: Normocephalic and atraumatic.  Cardiovascular: Normal rate, regular rhythm and normal heart sounds.   Pulmonary/Chest: Effort normal and breath sounds normal. No respiratory distress.    Musculoskeletal:       Right hip: She exhibits decreased range of motion and tenderness. She exhibits normal strength.       Left hip: She exhibits normal range of motion, normal strength and no tenderness.  Neurological: She is alert and oriented to person, place, and time. She has normal strength. No sensory deficit. Gait normal.  Skin: Skin is warm and dry. No rash noted.  Psychiatric: She has a normal mood and affect. Her behavior is normal. Thought content normal.  Nursing note and vitals reviewed.   BP 120/70   Pulse 62   Ht 5\' 1"  (1.549 m)   Wt 123 lb (55.8 kg)   BMI 23.24 kg/m   Assessment and Plan: 1. Uncontrolled type 2 diabetes mellitus with diabetic polyneuropathy, without long-term current use of insulin (HCC) Improving with addition of Lantus insulin - continue to slowly titrate up for blood sugar over 120 fasting - Hemoglobin A1c  2. Background retinopathy  due to secondary diabetes ([place]) stable  3. Essential (primary) hypertension controlled  4. Hip bursitis, right Recommend topical rubs and heat or ice three times per day If no improvement, consider Xrays   [name], MD [facility]  05/18/2016

## 2016-05-18 NOTE — Patient Instructions (Signed)
Hip Bursitis Bursitis is a swelling and soreness (inflammation) of a fluid-filled sac (bursa). This sac overlies and protects the joints.  CAUSES   Injury.  Overuse of the muscles surrounding the joint.  Arthritis.  Gout.  Infection.  Cold weather.  Inadequate warm-up and conditioning prior to activities. The cause may not be known.  SYMPTOMS   Mild to severe irritation.  Tenderness and swelling over the outside of the hip.  Pain with motion of the hip.  If the bursa becomes infected, a fever may be present. Redness, tenderness, and warmth will develop over the hip. Symptoms usually lessen in 3 to 4 weeks with treatment, but can come back. TREATMENT If conservative treatment does not work, your caregiver may advise draining the bursa and injecting cortisone into the area. This may speed up the healing process. This may also be used as an initial treatment of choice. HOME CARE INSTRUCTIONS   Apply ice to the affected area for 15-20 minutes every 3 to 4 hours while awake for the first 2 days. Put the ice in a plastic bag and place a towel between the bag of ice and your skin.  Rest the painful joint as much as possible, but continue to put the joint through a normal range of motion at least 4 times per day. When the pain lessens, begin normal, slow movements and usual activities to help prevent stiffness of the hip.  Only take over-the-counter or prescription medicines for pain, discomfort, or fever as directed by your caregiver.  Use crutches to limit weight bearing on the hip joint, if advised.  Elevate your painful hip to reduce swelling. Use pillows for propping and cushioning your legs and hips.  Gentle massage may provide comfort and decrease swelling. SEEK IMMEDIATE MEDICAL CARE IF:   Your pain increases even during treatment, or you are not improving.  You have a fever.  You have heat and inflammation over the involved bursa.  You have any other questions or  concerns. MAKE SURE YOU:   Understand these instructions.  Will watch your condition.  Will get help right away if you are not doing well or get worse.   This information is not intended to replace advice given to you by your health care provider. Make sure you discuss any questions you have with your health care provider.   Document Released: 04/03/2002 Document Revised: 01/04/2012 Document Reviewed: 05/14/2015 Elsevier Interactive Patient Education 2016 [employer] Inc[employer]  

## 2016-05-19 LAB — HEMOGLOBIN A1C
ESTIMATED AVERAGE GLUCOSE: 200 mg/dL
HEMOGLOBIN A1C: 8.6 % — AB (ref 4.8–5.6)

## 2016-06-16 DIAGNOSIS — Z85828 Personal history of other malignant neoplasm of skin: Secondary | ICD-10-CM | POA: Diagnosis not present

## 2016-06-16 DIAGNOSIS — Z08 Encounter for follow-up examination after completed treatment for malignant neoplasm: Secondary | ICD-10-CM | POA: Diagnosis not present

## 2016-06-16 DIAGNOSIS — Z1283 Encounter for screening for malignant neoplasm of skin: Secondary | ICD-10-CM | POA: Diagnosis not present

## 2016-06-16 DIAGNOSIS — D3614 Benign neoplasm of peripheral nerves and autonomic nervous system of thorax: Secondary | ICD-10-CM | POA: Diagnosis not present

## 2016-06-16 DIAGNOSIS — D225 Melanocytic nevi of trunk: Secondary | ICD-10-CM | POA: Diagnosis not present

## 2016-06-16 DIAGNOSIS — D485 Neoplasm of uncertain behavior of skin: Secondary | ICD-10-CM | POA: Diagnosis not present

## 2016-06-16 DIAGNOSIS — D2261 Melanocytic nevi of right upper limb, including shoulder: Secondary | ICD-10-CM | POA: Diagnosis not present

## 2016-06-16 DIAGNOSIS — L570 Actinic keratosis: Secondary | ICD-10-CM | POA: Diagnosis not present

## 2016-06-26 ENCOUNTER — Ambulatory Visit
Admission: RE | Admit: 2016-06-26 | Discharge: 2016-06-26 | Disposition: A | Discharge status: Home / Self Care | Payer: Medicare Other | Source: Ambulatory Visit | Attending: Internal Medicine | Admitting: Internal Medicine

## 2016-06-26 ENCOUNTER — Ambulatory Visit (INDEPENDENT_AMBULATORY_CARE_PROVIDER_SITE_OTHER): Payer: Medicare Other | Admitting: Internal Medicine

## 2016-06-26 ENCOUNTER — Encounter: Payer: Self-pay | Admitting: Internal Medicine

## 2016-06-26 VITALS — BP 142/78 | HR 62 | Resp 16 | Ht 61.0 in | Wt 122.6 lb

## 2016-06-26 DIAGNOSIS — I2510 Atherosclerotic heart disease of native coronary artery without angina pectoris: Secondary | ICD-10-CM | POA: Diagnosis not present

## 2016-06-26 DIAGNOSIS — I6523 Occlusion and stenosis of bilateral carotid arteries: Secondary | ICD-10-CM | POA: Diagnosis not present

## 2016-06-26 DIAGNOSIS — E785 Hyperlipidemia, unspecified: Secondary | ICD-10-CM | POA: Diagnosis not present

## 2016-06-26 DIAGNOSIS — I70212 Atherosclerosis of native arteries of extremities with intermittent claudication, left leg: Secondary | ICD-10-CM | POA: Diagnosis not present

## 2016-06-26 DIAGNOSIS — I872 Venous insufficiency (chronic) (peripheral): Secondary | ICD-10-CM | POA: Diagnosis not present

## 2016-06-26 DIAGNOSIS — E119 Type 2 diabetes mellitus without complications: Secondary | ICD-10-CM | POA: Diagnosis not present

## 2016-06-26 DIAGNOSIS — I70219 Atherosclerosis of native arteries of extremities with intermittent claudication, unspecified extremity: Secondary | ICD-10-CM | POA: Diagnosis not present

## 2016-06-26 DIAGNOSIS — M7071 Other bursitis of hip, right hip: Secondary | ICD-10-CM

## 2016-06-26 DIAGNOSIS — I10 Essential (primary) hypertension: Secondary | ICD-10-CM | POA: Diagnosis not present

## 2016-06-26 DIAGNOSIS — I8311 Varicose veins of right lower extremity with inflammation: Secondary | ICD-10-CM | POA: Diagnosis not present

## 2016-06-26 DIAGNOSIS — M79609 Pain in unspecified limb: Secondary | ICD-10-CM | POA: Diagnosis not present

## 2016-06-26 DIAGNOSIS — M1611 Unilateral primary osteoarthritis, right hip: Secondary | ICD-10-CM | POA: Diagnosis not present

## 2016-06-26 DIAGNOSIS — I70213 Atherosclerosis of native arteries of extremities with intermittent claudication, bilateral legs: Secondary | ICD-10-CM | POA: Diagnosis not present

## 2016-06-26 DIAGNOSIS — I8312 Varicose veins of left lower extremity with inflammation: Secondary | ICD-10-CM | POA: Diagnosis not present

## 2016-06-26 NOTE — Progress Notes (Signed)
Date:  06/26/2016   Name:  Jill Hudson   DOB:  Apr 09, 1935   MRN:  [id number]   Chief Complaint: Hip Pain (R hip pain x 1 month. Saw [facility] today and they did U/S it was normal. May need Xray is what they said. ) Hip Pain   The incident occurred at home. The injury mechanism was a twisting injury. The pain is present in the right hip. The quality of the pain is described as aching. The pain is mild. The pain has been fluctuating since onset. The symptoms are aggravated by movement and weight bearing. She has tried heat, ice and rest for the symptoms. The treatment provided mild relief.  Vascular did a study recently - no blood flow limitation seen.  Review of Systems  Constitutional: Negative for appetite change, fatigue and fever.  Respiratory: Negative for cough, chest tightness and shortness of breath.   Cardiovascular: Negative for chest pain, palpitations and leg swelling.  Gastrointestinal: Negative for abdominal pain.  Genitourinary: Negative for dysuria and hematuria.  Musculoskeletal: Positive for arthralgias and myalgias. Negative for back pain, gait problem and joint swelling.  Skin: Negative for rash.  Psychiatric/Behavioral: Negative for dysphoric mood and sleep disturbance.    Patient Active Problem List   Diagnosis Date Noted  . Hip bursitis 05/18/2016  . Elevated TSH 01/18/2016  . Chronic renal insufficiency, stage III (moderate) 01/16/2016  . Senile ecchymosis 01/16/2016  . Uncontrolled type 2 diabetes mellitus with diabetic polyneuropathy, without long-term current use of insulin ([place]) 09/16/2015  . GERD (gastroesophageal reflux disease) 07/24/2015  . Peripheral blood vessel disorder ([place]) 05/17/2015  . Neoplasm of uncertain behavior of skin 05/17/2015  . Background retinopathy due to secondary diabetes ([place]) 04/11/2015  . Diabetes mellitus with polyneuropathy ([place]) 04/11/2015  . HLD (hyperlipidemia) 04/11/2015  . Essential (primary) hypertension  04/11/2015  . Generalized OA 04/11/2015  . Arterial insufficiency ([place]) 04/11/2015  . Arteriosclerosis of coronary artery 05/29/2013  . Hypertensive heart disease without CHF 05/29/2013    Prior to Admission medications   Medication Sig Start Date End Date Taking? Authorizing Provider  acetaminophen (TYLENOL) 325 MG tablet Take 650 mg by mouth every 6 (six) hours as needed.   Yes Historical Provider, MD  aspirin 81 MG tablet Take 1 tablet by mouth daily.   Yes Historical Provider, MD  B Complex Vitamins (VITAMIN-B COMPLEX) TABS Take by mouth.   Yes Historical Provider, MD  Biotin 10 MG TABS Take by mouth.   Yes Historical Provider, MD  clopidogrel (PLAVIX) 75 MG tablet Take 1 tablet by mouth daily.   Yes Historical Provider, MD  Cyanocobalamin (RA VITAMIN B-12 TR) 1000 MCG TBCR Take by mouth.   Yes Historical Provider, MD  diltiazem (CARTIA XT) 180 MG 24 hr capsule Take 1 tablet by mouth 2 (two) times daily.   Yes Historical Provider, MD  enalapril (VASOTEC) 20 MG tablet Take 1 tablet by mouth daily.   Yes Historical Provider, MD  glimepiride (AMARYL) 4 MG tablet TAKE 1 TABLET BY MOUTH TWICE DAILY 10/08/15  Yes [name], MD  GLUCOSE BLOOD VI    Yes Historical Provider, MD  Insulin Glargine (LANTUS SOLOSTAR) 100 UNIT/ML Solostar Pen Inject 4-10 Units into the skin daily at 10 pm. 04/24/16  Yes [name], MD  Insulin Pen Needle (PEN NEEDLES 5/16") 30G X 8 MM MISC 1 each by Does not apply route daily. 04/24/16  Yes [name], MD  metFORMIN ([place]) 500  MG 24 hr tablet TAKE 1 TABLET BY MOUTH TWICE DAILY 11/25/15  Yes [name], MD  Multiple Vitamin (MULTI-VITAMINS) TABS Take by mouth.   Yes Historical Provider, MD  nitroGLYCERIN (NITROSTAT) 0.4 MG SL tablet 1 tab under the tongue for CP, may repeat in 5 minutes up to 3 doses 06/20/13  Yes Historical Provider, MD  Omega 3 1000 MG CAPS Take by mouth. 12/12/09  Yes Historical Provider, MD  pantoprazole (PROTONIX) 40  MG tablet Take 1 tablet (40 mg total) by mouth daily. 07/24/15  Yes [name], MD  pravastatin (PRAVACHOL) 20 MG tablet  02/13/16  Yes Historical Provider, MD    Allergies  Allergen Reactions  . Saxagliptin Diarrhea  . Codeine     Upset stomach  . Ezetimibe Other (See Comments)    myalgias  . Nitrofurantoin     Pruitus, rash  . Limonene Rash  . Sulfa Antibiotics Rash and Other (See Comments)    Sore mouth     Past Surgical History:  Procedure Laterality Date  . CARDIAC CATHETERIZATION  1998   40% LM, 95% Ramus interm  . PTCA  08/2013   Left common iliac  . PTCA  12/2012   left ext iliac    Social History  Substance Use Topics  . Smoking status: Former Research scientist (life sciences)  . Smokeless tobacco: Never Used  . Alcohol use 0.0 oz/week     Medication list has been reviewed and updated.   Physical Exam  Constitutional: She is oriented to person, place, and time. She appears well-developed. No distress.  HENT:  Head: Normocephalic and atraumatic.  Cardiovascular: Normal rate, regular rhythm and normal heart sounds.   Pulmonary/Chest: Effort normal and breath sounds normal. No respiratory distress.  Musculoskeletal:       Right hip: She exhibits decreased range of motion and tenderness. She exhibits normal strength.       Left hip: She exhibits normal range of motion, normal strength and no tenderness.  Neurological: She is alert and oriented to person, place, and time. She has normal strength and normal reflexes. No cranial nerve deficit or sensory deficit. Gait normal.  Psychiatric: She has a normal mood and affect. Her behavior is normal. Thought content normal.  Nursing note and vitals reviewed.   BP (!) 142/78 (BP Location: Right Arm, Patient Position: Sitting, Cuff Size: Normal)   Pulse 62   Resp 16   Ht 5\' 1"  (1.549 m)   Wt 122 lb 9.6 oz (55.6 kg)   SpO2 99%   BMI 23.17 kg/m   Assessment and Plan: 1. Hip bursitis, right Continue heat and rest - DG HIP UNILAT  WITH PELVIS 2-3 VIEWS RIGHT; Future - Ambulatory referral to [place], MD [facility]  06/26/2016

## 2016-07-01 DIAGNOSIS — M25551 Pain in right hip: Secondary | ICD-10-CM | POA: Diagnosis not present

## 2016-07-01 DIAGNOSIS — M5441 Lumbago with sciatica, right side: Secondary | ICD-10-CM | POA: Diagnosis not present

## 2016-07-01 DIAGNOSIS — M5136 Other intervertebral disc degeneration, lumbar region: Secondary | ICD-10-CM | POA: Diagnosis not present

## 2016-07-07 DIAGNOSIS — M6281 Muscle weakness (generalized): Secondary | ICD-10-CM | POA: Diagnosis not present

## 2016-07-07 DIAGNOSIS — M5441 Lumbago with sciatica, right side: Secondary | ICD-10-CM | POA: Diagnosis not present

## 2016-07-07 DIAGNOSIS — M25551 Pain in right hip: Secondary | ICD-10-CM | POA: Diagnosis not present

## 2016-07-14 DIAGNOSIS — M25551 Pain in right hip: Secondary | ICD-10-CM | POA: Diagnosis not present

## 2016-07-16 DIAGNOSIS — M5441 Lumbago with sciatica, right side: Secondary | ICD-10-CM | POA: Diagnosis not present

## 2016-07-16 DIAGNOSIS — M25551 Pain in right hip: Secondary | ICD-10-CM | POA: Diagnosis not present

## 2016-07-16 DIAGNOSIS — M6281 Muscle weakness (generalized): Secondary | ICD-10-CM | POA: Diagnosis not present

## 2016-07-21 DIAGNOSIS — M25551 Pain in right hip: Secondary | ICD-10-CM | POA: Diagnosis not present

## 2016-07-21 DIAGNOSIS — M5441 Lumbago with sciatica, right side: Secondary | ICD-10-CM | POA: Diagnosis not present

## 2016-07-21 DIAGNOSIS — M6281 Muscle weakness (generalized): Secondary | ICD-10-CM | POA: Diagnosis not present

## 2016-07-23 DIAGNOSIS — M25551 Pain in right hip: Secondary | ICD-10-CM | POA: Diagnosis not present

## 2016-07-23 DIAGNOSIS — M5441 Lumbago with sciatica, right side: Secondary | ICD-10-CM | POA: Diagnosis not present

## 2016-07-23 DIAGNOSIS — M6281 Muscle weakness (generalized): Secondary | ICD-10-CM | POA: Diagnosis not present

## 2016-07-28 DIAGNOSIS — M25551 Pain in right hip: Secondary | ICD-10-CM | POA: Diagnosis not present

## 2016-07-30 DIAGNOSIS — M5441 Lumbago with sciatica, right side: Secondary | ICD-10-CM | POA: Diagnosis not present

## 2016-07-30 DIAGNOSIS — M6281 Muscle weakness (generalized): Secondary | ICD-10-CM | POA: Diagnosis not present

## 2016-07-30 DIAGNOSIS — M25551 Pain in right hip: Secondary | ICD-10-CM | POA: Diagnosis not present

## 2016-08-04 DIAGNOSIS — M25551 Pain in right hip: Secondary | ICD-10-CM | POA: Diagnosis not present

## 2016-08-04 DIAGNOSIS — M6281 Muscle weakness (generalized): Secondary | ICD-10-CM | POA: Diagnosis not present

## 2016-08-04 DIAGNOSIS — M5441 Lumbago with sciatica, right side: Secondary | ICD-10-CM | POA: Diagnosis not present

## 2016-08-06 DIAGNOSIS — M25551 Pain in right hip: Secondary | ICD-10-CM | POA: Diagnosis not present

## 2016-08-06 DIAGNOSIS — M6281 Muscle weakness (generalized): Secondary | ICD-10-CM | POA: Diagnosis not present

## 2016-08-06 DIAGNOSIS — M5441 Lumbago with sciatica, right side: Secondary | ICD-10-CM | POA: Diagnosis not present

## 2016-08-12 ENCOUNTER — Telehealth: Payer: Self-pay

## 2016-08-12 ENCOUNTER — Other Ambulatory Visit: Payer: Self-pay | Admitting: Internal Medicine

## 2016-08-12 DIAGNOSIS — IMO0002 Reserved for concepts with insufficient information to code with codable children: Secondary | ICD-10-CM

## 2016-08-12 DIAGNOSIS — E1142 Type 2 diabetes mellitus with diabetic polyneuropathy: Secondary | ICD-10-CM

## 2016-08-12 DIAGNOSIS — E1165 Type 2 diabetes mellitus with hyperglycemia: Secondary | ICD-10-CM

## 2016-08-12 NOTE — Telephone Encounter (Signed)
Referral placed.

## 2016-08-12 NOTE — Telephone Encounter (Signed)
[facility] made house visit and recommended patient see Dr. Norval Gable Endocrinology for DM control- Can we do referral?

## 2016-09-16 DIAGNOSIS — M5440 Lumbago with sciatica, unspecified side: Secondary | ICD-10-CM | POA: Diagnosis not present

## 2016-09-16 DIAGNOSIS — M5136 Other intervertebral disc degeneration, lumbar region: Secondary | ICD-10-CM | POA: Diagnosis not present

## 2016-09-23 ENCOUNTER — Encounter: Payer: Self-pay | Admitting: Internal Medicine

## 2016-09-23 ENCOUNTER — Ambulatory Visit (INDEPENDENT_AMBULATORY_CARE_PROVIDER_SITE_OTHER): Payer: Medicare Other | Admitting: Internal Medicine

## 2016-09-23 VITALS — BP 110/76 | HR 48 | Resp 16 | Ht 61.0 in | Wt 124.0 lb

## 2016-09-23 DIAGNOSIS — E1142 Type 2 diabetes mellitus with diabetic polyneuropathy: Secondary | ICD-10-CM | POA: Diagnosis not present

## 2016-09-23 DIAGNOSIS — IMO0002 Reserved for concepts with insufficient information to code with codable children: Secondary | ICD-10-CM

## 2016-09-23 DIAGNOSIS — Z794 Long term (current) use of insulin: Secondary | ICD-10-CM | POA: Diagnosis not present

## 2016-09-23 DIAGNOSIS — Z23 Encounter for immunization: Secondary | ICD-10-CM

## 2016-09-23 DIAGNOSIS — E1165 Type 2 diabetes mellitus with hyperglycemia: Secondary | ICD-10-CM | POA: Diagnosis not present

## 2016-09-23 NOTE — Progress Notes (Signed)
Date:  09/23/2016   Name:  Jill Hudson   DOB:  Oct 07, 1935   MRN:  [id number]   Chief Complaint: Diabetes (BS 230-330. ) Diabetes  She presents for her follow-up diabetic visit. She has type 2 diabetes mellitus. Her disease course has been fluctuating. Pertinent negatives for hypoglycemia include no headaches or tremors. Pertinent negatives for diabetes include no chest pain, no fatigue, no polydipsia and no polyuria. Her weight is fluctuating dramatically. Her breakfast blood glucose is taken between 6-7 am. Her breakfast blood glucose range is generally 90-110 mg/dl. Her dinner blood glucose is taken after 8 pm. Her dinner blood glucose range is generally >200 mg/dl.   Hip pain - on right.  Seen by Orthopedics.  She is still trying to walk daily.  She had PVD monitoring and was told that circulation was stable.   Review of Systems  Constitutional: Negative for appetite change, fatigue, fever and unexpected weight change.  HENT: Negative for tinnitus and trouble swallowing.   Eyes: Negative for visual disturbance.  Respiratory: Negative for cough, chest tightness and shortness of breath.   Cardiovascular: Negative for chest pain, palpitations and leg swelling.  Gastrointestinal: Negative for abdominal pain.  Endocrine: Negative for polydipsia and polyuria.  Genitourinary: Negative for dysuria and hematuria.  Musculoskeletal: Negative for arthralgias.  Neurological: Negative for tremors, numbness and headaches.  Psychiatric/Behavioral: Negative for dysphoric mood.    Patient Active Problem List   Diagnosis Date Noted  . Hip bursitis 05/18/2016  . Elevated TSH 01/18/2016  . Chronic renal insufficiency, stage III (moderate) 01/16/2016  . Senile ecchymosis 01/16/2016  . Uncontrolled type 2 diabetes mellitus with diabetic polyneuropathy, with long-term current use of insulin ([place]) 09/16/2015  . GERD (gastroesophageal reflux disease) 07/24/2015  . Peripheral blood vessel disorder  ([place]) 05/17/2015  . Neoplasm of uncertain behavior of skin 05/17/2015  . Background retinopathy due to secondary diabetes ([place]) 04/11/2015  . Diabetes mellitus with polyneuropathy ([place]) 04/11/2015  . HLD (hyperlipidemia) 04/11/2015  . Essential (primary) hypertension 04/11/2015  . Generalized OA 04/11/2015  . Arterial insufficiency ([place]) 04/11/2015  . Arteriosclerosis of coronary artery 05/29/2013  . Hypertensive heart disease without CHF 05/29/2013    Prior to Admission medications   Medication Sig Start Date End Date Taking? Authorizing Provider  acetaminophen (TYLENOL) 325 MG tablet Take 650 mg by mouth every 6 (six) hours as needed.   Yes Historical Provider, MD  amoxicillin (AMOXIL) 500 MG capsule  09/07/16  Yes Historical Provider, MD  aspirin 81 MG tablet Take 1 tablet by mouth daily.   Yes Historical Provider, MD  B Complex Vitamins (VITAMIN-B COMPLEX) TABS Take by mouth.   Yes Historical Provider, MD  Biotin 10 MG TABS Take by mouth.   Yes Historical Provider, MD  clopidogrel (PLAVIX) 75 MG tablet Take 1 tablet by mouth daily.   Yes Historical Provider, MD  Cyanocobalamin (RA VITAMIN B-12 TR) 1000 MCG TBCR Take by mouth.   Yes Historical Provider, MD  diltiazem (CARTIA XT) 180 MG 24 hr capsule Take 1 tablet by mouth 2 (two) times daily.   Yes Historical Provider, MD  enalapril (VASOTEC) 20 MG tablet Take 1 tablet by mouth daily.   Yes Historical Provider, MD  glimepiride (AMARYL) 4 MG tablet TAKE 1 TABLET BY MOUTH TWICE DAILY 10/08/15  Yes [name], MD  GLUCOSE BLOOD VI    Yes Historical Provider, MD  Insulin Glargine (LANTUS SOLOSTAR) 100 UNIT/ML Solostar Pen Inject 4-10 Units into the skin daily  at 10 pm. 04/24/16  Yes [name], MD  Insulin Pen Needle (PEN NEEDLES 5/16") 30G X 8 MM MISC 1 each by Does not apply route daily. 04/24/16  Yes [name], MD  metFORMIN (GLUCOPHAGE-XR) 500 MG 24 hr tablet TAKE 1 TABLET BY MOUTH TWICE DAILY 11/25/15  Yes [name], MD  Multiple Vitamin (MULTI-VITAMINS) TABS Take by mouth.   Yes Historical Provider, MD  Omega 3 1000 MG CAPS Take by mouth. 12/12/09  Yes Historical Provider, MD  pantoprazole (PROTONIX) 40 MG tablet Take 1 tablet (40 mg total) by mouth daily. 07/24/15  Yes [name], MD  pravastatin (PRAVACHOL) 20 MG tablet  02/13/16  Yes Historical Provider, MD  traMADol (ULTRAM) 50 MG tablet Take by mouth. 09/16/16  Yes Historical Provider, MD  gabapentin (NEURONTIN) 100 MG capsule Take by mouth. 09/16/16 11/15/16  Historical Provider, MD  nitroGLYCERIN (NITROSTAT) 0.4 MG SL tablet 1 tab under the tongue for CP, may repeat in 5 minutes up to 3 doses 06/20/13   Historical Provider, MD    Allergies  Allergen Reactions  . Saxagliptin Diarrhea  . Codeine     Upset stomach  . Ezetimibe Other (See Comments)    myalgias  . Nitrofurantoin     Pruitus, rash  . Limonene Rash  . Sulfa Antibiotics Rash and Other (See Comments)    Sore mouth     Past Surgical History:  Procedure Laterality Date  . CARDIAC CATHETERIZATION  1998   40% LM, 95% Ramus interm  . PTCA  08/2013   Left common iliac  . PTCA  12/2012   left ext iliac    Social History  Substance Use Topics  . Smoking status: Former Research scientist (life sciences)  . Smokeless tobacco: Never Used  . Alcohol use 0.0 oz/week     Medication list has been reviewed and updated.   Physical Exam  Constitutional: She is oriented to person, place, and time. She appears well-developed. No distress.  HENT:  Head: Normocephalic and atraumatic.  Neck: Normal range of motion.  Cardiovascular: Normal rate, regular rhythm and normal heart sounds.   Pulmonary/Chest: Effort normal and breath sounds normal. No respiratory distress. She has no wheezes.  Musculoskeletal: She exhibits no edema or tenderness.  Lymphadenopathy:    She has no cervical adenopathy.  Neurological: She is alert and oriented to person, place, and time. A sensory deficit (decreased sensation  on left foot) is present.  Skin: Skin is warm and dry. No rash noted.  Psychiatric: She has a normal mood and affect. Her behavior is normal. Thought content normal.  Nursing note and vitals reviewed.   BP 110/76   Pulse (!) 48   Resp 16   Ht 5\' 1"  (1.549 m)   Wt 124 lb (56.2 kg)   SpO2 99%   BMI 23.43 kg/m   Assessment and Plan: 1. Uncontrolled type 2 diabetes mellitus with diabetic polyneuropathy, with long-term current use of insulin ([place]) Add a second dose of Lantus 4 units in AM Follow up with Endocrinology as planned - Hemoglobin A1c   [name], MD [facility]  09/23/2016

## 2016-09-23 NOTE — Patient Instructions (Signed)
Add Lantus 4 units at 10 AM.  Continue Lantus 8 units at 10 PM.

## 2016-09-24 LAB — HEMOGLOBIN A1C
ESTIMATED AVERAGE GLUCOSE: 220 mg/dL
HEMOGLOBIN A1C: 9.3 % — AB (ref 4.8–5.6)

## 2016-09-29 DIAGNOSIS — Z1231 Encounter for screening mammogram for malignant neoplasm of breast: Secondary | ICD-10-CM | POA: Diagnosis not present

## 2016-10-06 DIAGNOSIS — I10 Essential (primary) hypertension: Secondary | ICD-10-CM | POA: Diagnosis not present

## 2016-10-06 DIAGNOSIS — I739 Peripheral vascular disease, unspecified: Secondary | ICD-10-CM | POA: Diagnosis not present

## 2016-10-06 DIAGNOSIS — I2510 Atherosclerotic heart disease of native coronary artery without angina pectoris: Secondary | ICD-10-CM | POA: Diagnosis not present

## 2016-10-06 DIAGNOSIS — E7800 Pure hypercholesterolemia, unspecified: Secondary | ICD-10-CM | POA: Diagnosis not present

## 2016-10-07 ENCOUNTER — Other Ambulatory Visit: Payer: Self-pay | Admitting: Orthopedic Surgery

## 2016-10-07 DIAGNOSIS — M5416 Radiculopathy, lumbar region: Secondary | ICD-10-CM | POA: Diagnosis not present

## 2016-10-07 DIAGNOSIS — M5440 Lumbago with sciatica, unspecified side: Secondary | ICD-10-CM

## 2016-10-07 DIAGNOSIS — M5136 Other intervertebral disc degeneration, lumbar region: Secondary | ICD-10-CM | POA: Diagnosis not present

## 2016-10-07 DIAGNOSIS — M545 Low back pain, unspecified: Secondary | ICD-10-CM

## 2016-10-16 DIAGNOSIS — E1165 Type 2 diabetes mellitus with hyperglycemia: Secondary | ICD-10-CM | POA: Diagnosis not present

## 2016-10-16 DIAGNOSIS — Z794 Long term (current) use of insulin: Secondary | ICD-10-CM | POA: Diagnosis not present

## 2016-10-16 DIAGNOSIS — R946 Abnormal results of thyroid function studies: Secondary | ICD-10-CM | POA: Diagnosis not present

## 2016-10-22 ENCOUNTER — Ambulatory Visit
Admission: RE | Admit: 2016-10-22 | Discharge: 2016-10-22 | Disposition: A | Discharge status: Home / Self Care | Payer: Medicare Other | Source: Ambulatory Visit | Attending: Orthopedic Surgery | Admitting: Orthopedic Surgery

## 2016-10-22 DIAGNOSIS — M5126 Other intervertebral disc displacement, lumbar region: Secondary | ICD-10-CM | POA: Diagnosis not present

## 2016-10-22 DIAGNOSIS — M5136 Other intervertebral disc degeneration, lumbar region: Secondary | ICD-10-CM | POA: Diagnosis present

## 2016-10-22 DIAGNOSIS — M545 Low back pain, unspecified: Secondary | ICD-10-CM

## 2016-10-22 DIAGNOSIS — N281 Cyst of kidney, acquired: Secondary | ICD-10-CM | POA: Diagnosis not present

## 2016-10-22 DIAGNOSIS — M48061 Spinal stenosis, lumbar region without neurogenic claudication: Secondary | ICD-10-CM | POA: Diagnosis not present

## 2016-10-22 DIAGNOSIS — M47816 Spondylosis without myelopathy or radiculopathy, lumbar region: Secondary | ICD-10-CM | POA: Insufficient documentation

## 2016-10-22 DIAGNOSIS — M5416 Radiculopathy, lumbar region: Secondary | ICD-10-CM

## 2016-10-22 DIAGNOSIS — M5440 Lumbago with sciatica, unspecified side: Secondary | ICD-10-CM

## 2016-11-16 ENCOUNTER — Encounter (INDEPENDENT_AMBULATORY_CARE_PROVIDER_SITE_OTHER): Payer: Self-pay

## 2016-11-16 ENCOUNTER — Ambulatory Visit (INDEPENDENT_AMBULATORY_CARE_PROVIDER_SITE_OTHER): Payer: Self-pay | Admitting: Vascular Surgery

## 2016-11-17 ENCOUNTER — Encounter: Payer: Self-pay | Admitting: Dietician

## 2016-11-17 ENCOUNTER — Encounter: Payer: Medicare Other | Attending: Internal Medicine | Admitting: Dietician

## 2016-11-17 VITALS — BP 152/60 | Ht 61.5 in | Wt 117.5 lb

## 2016-11-17 DIAGNOSIS — E119 Type 2 diabetes mellitus without complications: Secondary | ICD-10-CM | POA: Diagnosis not present

## 2016-11-17 DIAGNOSIS — Z713 Dietary counseling and surveillance: Secondary | ICD-10-CM | POA: Diagnosis not present

## 2016-11-17 DIAGNOSIS — Z794 Long term (current) use of insulin: Secondary | ICD-10-CM

## 2016-11-17 NOTE — Progress Notes (Signed)
Diabetes Self-Management Education  Visit Type: First/Initial  Appt. Start Time: 1330 Appt. End Time:1440  11/17/2016  Jill Hudson, identified by name and date of birth, is a 82 y.o. female with a diagnosis of Diabetes: Type 2.   ASSESSMENT  Blood pressure (!) 152/60, height 5' 1.5" (1.562 m), weight 117 lb 8 oz (53.3 kg). Body mass index is 21.84 kg/m.      Diabetes Self-Management Education - 11/17/16 1724      Visit Information   Visit Type First/Initial     Initial Visit   Diabetes Type Type 2     Health Coping   How would you rate your overall health? Fair     Psychosocial Assessment   Patient Belief/Attitude about Diabetes Motivated to manage diabetes   Self-care barriers None   Patient Concerns Medication;Glycemic Control  become more fit and prevent complications   Special Needs None   Preferred Learning Style Visual   Learning Readiness Ready   What is the last grade level you completed in school? 12     Pre-Education Assessment   Patient understands the diabetes disease and treatment process. Needs Review   Patient understands incorporating nutritional management into lifestyle. Needs Review  eats small amounts-decreased appetite; eats Little Debbie cakes every afternooon for a snack   Patient undertands incorporating physical activity into lifestyle. Needs Review   Patient understands using medications safely. Needs Review   Patient understands monitoring blood glucose, interpreting and using results Needs Review  has low BG's often especially at night-usually treats with apple juice box   Patient understands prevention, detection, and treatment of acute complications. Needs Review   Patient understands prevention, detection, and treatment of chronic complications. Needs Review   Patient understands how to develop strategies to address psychosocial issues. Needs Review   Patient understands how to develop strategies to promote health/change behavior.  Needs Review     Complications   Last HgB A1C per patient/outside source 8.6 %  09-2016   How often do you check your blood sugar? --  2-3x/day   Fasting Blood glucose range (mg/dL) <70;>200;70-129   Postprandial Blood glucose range (mg/dL) 70-129;>200;180-200   Have you had a dilated eye exam in the past 12 months? Yes  11-2015   Have you had a dental exam in the past 12 months? Yes  09-2016   Are you checking your feet? Yes   How many days per week are you checking your feet? 5     Dietary Intake   Breakfast eats breakfast at 10a   Snack (morning) --  none   Lunch --  eats lunch at 1-2p   Snack (afternoon) --  eats Little Debbie cake at 3p daily   Dinner --  eats supper at [employer] (evening) --  none   Beverage(s) --  drinks water 4-5x/day and unsweetened teat or diet sodas 4-5x/day; milk 1x/day; drinks apple juice 2-3x/day to treat low BG's     Exercise   Exercise Type Light (walking / raking leaves)  walks 10-15 min. 2-3x/wk     Patient Education   Previous Diabetes Education No   Disease state  Definition of diabetes, type 1 and 2, and the diagnosis of diabetes   Nutrition management  Role of diet in the treatment of diabetes and the relationship between the three main macronutrients and blood glucose level;Food label reading, portion sizes and measuring food.;Carbohydrate counting   Physical activity and exercise  Role of exercise on diabetes  management, blood pressure control and cardiac health.;Helped patient identify appropriate exercises in relation to his/her diabetes, diabetes complications and other health issue.   Medications Taught/reviewed insulin injection, site rotation, insulin storage and needle disposal.;Reviewed patients medication for diabetes, action, purpose, timing of dose and side effects.   Monitoring Purpose and frequency of SMBG.;Yearly dilated eye exam;Identified appropriate SMBG and/or A1C goals.;Taught/discussed recording of test results  and interpretation of SMBG.   Chronic complications Relationship between chronic complications and blood glucose control;Dental care;Retinopathy and reason for yearly dilated eye exams;Nephropathy, what it is, prevention of, the use of ACE, ARB's and early detection of through urine microalbumia.;Reviewed with patient heart disease, higher risk of, and prevention;Identified and discussed with patient  current chronic complications;Lipid levels, blood glucose control and heart disease   Personal strategies to promote health Lifestyle issues that need to be addressed for better diabetes care;Helped patient develop diabetes management plan for (enter comment)      Individualized Plan for Diabetes Self-Management Training:   Learning Objective:  Patient will have a greater understanding of diabetes self-management. Patient education plan is to attend individual and/or group sessions per assessed needs and concerns.   Plan:   Patient Instructions   Check blood sugars 2 x day before breakfast and 2 hrs after supper every day and record Exercise: Continue walking 15 min. 3-4x/wk.  Avoid sugar sweetened drinks (soda, tea, coffee, sports drinks, juices)-EXCEPT when having a low BG Limit intake of sweets and fried foods Eat 3 meals day,   1   snacks a day at bedtime Space meals 4-5 hours apart Eat 2-3 carbohydrate servings/meal + protein Eat 1 carbohydrate serving/snack + protein Carry medical alert ID at all times Bring blood sugar records to the next appointment/class Get a Regulatory affairs officer fast acting glucose and a snack at all times Rotate injection sites Return for appointment/classes on:  12-01-16   Expected Outcomes:   positive  Education material provided: Living Well With Diabetes booklet, A1C handout, high and low BG handouts  If problems or questions, patient to contact team via: [phone]  Future DSME appointment:  12-01-16

## 2016-11-17 NOTE — Patient Instructions (Signed)
  Check blood sugars 2 x day before breakfast and 2 hrs after supper every day and record Exercise: Continue walking 15 min. 3-4x/wk.  Avoid sugar sweetened drinks (soda, tea, coffee, sports drinks, juices)-EXCEPT when having a low BG Limit intake of sweets and fried foods Eat 3 meals day,   1   snacks a day at bedtime Space meals 4-5 hours apart Eat 2-3 carbohydrate servings/meal + protein Eat 1 carbohydrate serving/snack + protein Carry medical alert ID at all times Bring blood sugar records to the next appointment/class Get a Sharps container Carry fast acting glucose and a snack at all times Rotate injection sites Return for appointment/classes on:  12-01-16

## 2016-11-18 ENCOUNTER — Other Ambulatory Visit: Payer: Self-pay | Admitting: Internal Medicine

## 2016-11-18 MED ORDER — NITROGLYCERIN 0.4 MG SL SUBL: SUBLINGUAL_TABLET | SUBLINGUAL | 5 refills | Status: DC

## 2016-11-27 DIAGNOSIS — E1165 Type 2 diabetes mellitus with hyperglycemia: Secondary | ICD-10-CM | POA: Diagnosis not present

## 2016-11-27 DIAGNOSIS — Z794 Long term (current) use of insulin: Secondary | ICD-10-CM | POA: Diagnosis not present

## 2016-11-27 DIAGNOSIS — R05 Cough: Secondary | ICD-10-CM | POA: Diagnosis not present

## 2016-11-27 DIAGNOSIS — R946 Abnormal results of thyroid function studies: Secondary | ICD-10-CM | POA: Diagnosis not present

## 2016-11-27 DIAGNOSIS — I10 Essential (primary) hypertension: Secondary | ICD-10-CM | POA: Diagnosis not present

## 2016-12-01 ENCOUNTER — Encounter: Payer: Self-pay | Admitting: Dietician

## 2016-12-01 ENCOUNTER — Encounter: Payer: Medicare Other | Attending: Internal Medicine | Admitting: Dietician

## 2016-12-01 ENCOUNTER — Ambulatory Visit (INDEPENDENT_AMBULATORY_CARE_PROVIDER_SITE_OTHER): Payer: Medicare Other | Admitting: Internal Medicine

## 2016-12-01 ENCOUNTER — Encounter: Payer: Self-pay | Admitting: Internal Medicine

## 2016-12-01 VITALS — BP 144/70 | Wt 119.0 lb

## 2016-12-01 VITALS — BP 160/42 | HR 68 | Ht 61.0 in | Wt 121.0 lb

## 2016-12-01 DIAGNOSIS — E119 Type 2 diabetes mellitus without complications: Secondary | ICD-10-CM | POA: Insufficient documentation

## 2016-12-01 DIAGNOSIS — Z713 Dietary counseling and surveillance: Secondary | ICD-10-CM | POA: Insufficient documentation

## 2016-12-01 DIAGNOSIS — N3000 Acute cystitis without hematuria: Secondary | ICD-10-CM

## 2016-12-01 DIAGNOSIS — Z794 Long term (current) use of insulin: Secondary | ICD-10-CM

## 2016-12-01 LAB — POC URINALYSIS WITH MICROSCOPIC (NON AUTO)MANUAL RESULT
Bilirubin, UA: NEGATIVE
CRYSTALS: 0
Epithelial cells, urine per micros: 0
GLUCOSE UA: NEGATIVE
Ketones, UA: NEGATIVE
Mucus, UA: 0
Nitrite, UA: POSITIVE
PH UA: 6.5
PROTEIN UA: NEGATIVE
RBC: 0 M/uL — AB (ref 4.04–5.48)
Urobilinogen, UA: 0.2
WBC CASTS UA: 10

## 2016-12-01 MED ORDER — CIPROFLOXACIN HCL 250 MG PO TABS: 250.0000 mg | ORAL_TABLET | Freq: Two times a day (BID) | ORAL | 0 refills | Status: DC

## 2016-12-01 NOTE — Patient Instructions (Signed)
  Check blood sugars at least 2 x day before breakfast and 2 hrs after a meal every day  Exercise:  Continue walking  for 15 minutes  7  days a week  Avoid sugar sweetened drinks (soda, tea, coffee, sports drinks, juices)  Eat 3 meals day,   2  snacks a day  Space meals 4-5 hours apart  Complete 3 Day Food Record and bring to next appt  Bring blood sugar records to the next appointment/class  Return for appointment/classes on:  12-09-16

## 2016-12-01 NOTE — Progress Notes (Signed)
Diabetes Self-Management Education  Visit Type:  Follow-up  Appt. Start Time: 1330 Appt. End Time: 1430 12/01/2016  [name], identified by name and date of birth, is a 82 y.o. female with a diagnosis of Diabetes:  .   ASSESSMENT  Blood pressure (!) 144/70, weight 119 lb (54 kg). Body mass index is 22.48 kg/m.       Diabetes Self-Management Education - 35/36/14 4315      Complications   How often do you check your blood sugar? 3-4 times / week   Fasting Blood glucose range (mg/dL) 70-129;130-179   Postprandial Blood glucose range (mg/dL) >200  200's;500'sx1 11-29-16(pt has current UTI-starting Cipro today   Have you had a dilated eye exam in the past 12 months? Yes  appt 12-02-16   Have you had a dental exam in the past 12 months? Yes  11-25-16   Are you checking your feet? Yes   How many days per week are you checking your feet? 7     Dietary Intake   Breakfast eats 3 meals/day and 1 snack     Exercise   Exercise Type --  walks 15 min daily   How many days per week to you exercise? 7   How many minutes per day do you exercise? 15   Total minutes per week of exercise 105     Patient Education   Physical activity and exercise  Role of exercise on diabetes management, blood pressure control and cardiac health.;Helped patient identify appropriate exercises in relation to his/her diabetes, diabetes complications and other health issue.   Monitoring Purpose and frequency of SMBG.;Identified appropriate SMBG and/or A1C goals.;Daily foot exams;Yearly dilated eye exam   Acute complications Taught treatment of hypoglycemia - the 15 rule.;Discussed and identified patients' treatment of hyperglycemia.;Covered sick day management with medication and food.   Chronic complications Relationship between chronic complications and blood glucose control;Assessed and discussed foot care and prevention of foot problems;Dental care;Retinopathy and reason for yearly dilated eye  exams;Nephropathy, what it is, prevention of, the use of ACE, ARB's and early detection of through urine microalbumia.;Reviewed with patient heart disease, higher risk of, and prevention;Lipid levels, blood glucose control and heart disease;Applicable immunizations   Psychosocial adjustment Role of stress on diabetes;Worked with patient to identify barriers to care and solutions;Identified and addressed patients feelings and concerns about diabetes;Brainstormed with patient on coping mechanisms for social situations, getting support from significant others, dealing with feelings about diabetes     Outcomes   Program Status Completed      Learning Objective:  Patient will have a greater understanding of diabetes self-management. Patient education plan is to attend individual and/or group sessions per assessed needs and concerns.   Plan:   Patient Instructions   Check blood sugars at least 2 x day before breakfast and 2 hrs after a meal every day  Exercise:  Continue walking  for 15 minutes  7  days a week  Avoid sugar sweetened drinks (soda, tea, coffee, sports drinks, juices)  Eat 3 meals day,   2  snacks a day  Space meals 4-5 hours apart  Complete 3 Day Food Record and bring to next appt  Bring blood sugar records to the next appointment/class  Return for appointment/classes on:  12-09-16 (Follow up with RD for MNT program)    Expected Outcomes:   positive  Education material provided:Class 3 handout, high and low BG handouts, Foot care handout, sick day care handout, Kidney Function handout, stress  handouts  If problems or questions, patient to contact team via:  [phone]  Future DSME appointment:  12-09-16

## 2016-12-01 NOTE — Progress Notes (Signed)
Date:  12/01/2016   Name:  Jill Hudson   DOB:  08/06/1935   MRN:  [id number]   Chief Complaint: Urinary Tract Infection Urinary Tract Infection   This is a new problem. The current episode started in the past 7 days. The problem occurs every urination. The problem has been unchanged. There has been no fever. Associated symptoms include urgency. Pertinent negatives include no chills, flank pain or hematuria.  Her blood sugars have been slightly elevated recently.  She is seeing Endo - recently tried Victoza but accidentally took the full dose and had severe vomiting.  She is trying to get up her nerve to try it again.  Review of Systems  Constitutional: Negative for chills, fatigue and fever.  Respiratory: Negative for chest tightness and shortness of breath.   Cardiovascular: Negative for chest pain.  Gastrointestinal: Negative for abdominal pain.  Genitourinary: Positive for dysuria and urgency. Negative for flank pain and hematuria.    Patient Active Problem List   Diagnosis Date Noted  . Hip bursitis 05/18/2016  . Elevated TSH 01/18/2016  . Chronic renal insufficiency, stage III (moderate) 01/16/2016  . Senile ecchymosis 01/16/2016  . Uncontrolled type 2 diabetes mellitus with diabetic polyneuropathy, with long-term current use of insulin ([place]) 09/16/2015  . GERD (gastroesophageal reflux disease) 07/24/2015  . Peripheral blood vessel disorder ([place]) 05/17/2015  . Neoplasm of uncertain behavior of skin 05/17/2015  . Background retinopathy due to secondary diabetes ([place]) 04/11/2015  . Diabetes mellitus with polyneuropathy ([place]) 04/11/2015  . HLD (hyperlipidemia) 04/11/2015  . Essential (primary) hypertension 04/11/2015  . Generalized OA 04/11/2015  . Arterial insufficiency ([place]) 04/11/2015  . Arteriosclerosis of coronary artery 05/29/2013  . Hypertensive heart disease without CHF 05/29/2013    Prior to Admission medications   Medication Sig Start Date End Date Taking?  Authorizing Provider  Acetaminophen (TYLENOL PO) Take by mouth. Takes one 650 mg tablet + one 500 ng tablet at bedtime    Historical Provider, MD  aspirin 81 MG tablet Take 1 tablet by mouth daily.    Historical Provider, MD  B Complex Vitamins (VITAMIN-B COMPLEX) TABS Take 1 tablet by mouth daily.     Historical Provider, MD  Biotin 10 MG TABS Take 1 tablet by mouth daily.     Historical Provider, MD  clopidogrel (PLAVIX) 75 MG tablet Take 1 tablet by mouth daily.    Historical Provider, MD  Cyanocobalamin (RA VITAMIN B-12 TR) 1000 MCG TBCR Take 1 tablet by mouth daily.     Historical Provider, MD  diltiazem (CARTIA XT) 180 MG 24 hr capsule Take 1 tablet by mouth 2 (two) times daily.    Historical Provider, MD  enalapril (VASOTEC) 20 MG tablet Take 20 mg by mouth daily. TAKE 1.5 tablets daily in AM    Historical Provider, MD  gabapentin (NEURONTIN) 100 MG capsule Take 100 mg by mouth at bedtime as needed.  09/16/16 11/15/16  Historical Provider, MD  glimepiride (AMARYL) 4 MG tablet TAKE 1 TABLET BY MOUTH TWICE DAILY 10/08/15   [name], MD  Insulin Glargine (LANTUS SOLOSTAR) 100 UNIT/ML Solostar Pen Inject 4 Units into the skin 2 (two) times daily. TAKE 4 units in AM and 8 units daily at bedtime    Historical Provider, MD  liraglutide 18 MG/3ML SOPN Inject 0.6 mg into the skin daily. 10/19/16 10/19/17  Historical Provider, MD  metFORMIN (GLUCOPHAGE-XR) 500 MG 24 hr tablet TAKE 1 TABLET BY MOUTH TWICE DAILY 11/25/15   Mickel Baas  Ines Bloomer, MD  Multiple Vitamin (MULTI-VITAMINS) TABS Take 1 tablet by mouth daily.     Historical Provider, MD  nitroGLYCERIN (NITROSTAT) 0.4 MG SL tablet PLACE 1 TABLET UNDER THE TONGUE FOR CHEST PAIN, MAY REPEAT IN 5 MINUTES FOR UP TO 3 DOSES 11/18/16   [name], MD  Omega 3 1000 MG CAPS Take 1 capsule by mouth daily.  12/12/09   Historical Provider, MD  penicillin v potassium (VEETID) 500 MG tablet Take 1 tablet by mouth 4 (four) times daily. 09/22/16    Historical Provider, MD  pravastatin (PRAVACHOL) 20 MG tablet  02/13/16   Historical Provider, MD  traMADol (ULTRAM) 50 MG tablet Take 50 mg by mouth as needed.  09/16/16   Historical Provider, MD    Allergies  Allergen Reactions  . Saxagliptin Diarrhea  . Codeine     Upset stomach  . Epinephrine   . Ezetimibe Other (See Comments)    myalgias  . Nitrofurantoin     Pruitus, rash  . Limonene Rash  . Sulfa Antibiotics Rash and Other (See Comments)    Sore mouth     Past Surgical History:  Procedure Laterality Date  . CARDIAC CATHETERIZATION  1998   40% LM, 95% Ramus interm  . PTCA  08/2013   Left common iliac  . PTCA  12/2012   left ext iliac    Social History  Substance Use Topics  . Smoking status: Former Research scientist (life sciences)  . Smokeless tobacco: Never Used  . Alcohol use No     Medication list has been reviewed and updated.   Physical Exam  Constitutional: She is oriented to person, place, and time. She appears well-developed. No distress.  HENT:  Head: Normocephalic and atraumatic.  Cardiovascular: Normal rate, regular rhythm and normal heart sounds.   Pulmonary/Chest: Effort normal and breath sounds normal. No respiratory distress.  Abdominal: Normal appearance. There is tenderness in the suprapubic area. There is no rigidity, no guarding and no CVA tenderness.  Musculoskeletal: Normal range of motion.  Neurological: She is alert and oriented to person, place, and time.  Skin: Skin is warm and dry. No rash noted.  Psychiatric: She has a normal mood and affect. Her behavior is normal. Thought content normal.  Nursing note and vitals reviewed.   BP (!) 160/42   Pulse 68   Ht 5\' 1"  (1.549 m)   Wt 121 lb (54.9 kg)   BMI 22.86 kg/m   Assessment and Plan: 1. Acute cystitis without hematuria Continue adequate fluids Follow up if needed - POC urinalysis w microscopic (non auto) - ciprofloxacin (CIPRO) 250 MG tablet; Take 1 tablet (250 mg total) by mouth 2 (two) times  daily.  Dispense: 14 tablet; Refill: 0   [name], MD [facility]  12/01/2016

## 2016-12-02 DIAGNOSIS — E113393 Type 2 diabetes mellitus with moderate nonproliferative diabetic retinopathy without macular edema, bilateral: Secondary | ICD-10-CM | POA: Diagnosis not present

## 2016-12-02 DIAGNOSIS — H353131 Nonexudative age-related macular degeneration, bilateral, early dry stage: Secondary | ICD-10-CM | POA: Diagnosis not present

## 2016-12-09 ENCOUNTER — Encounter: Payer: Medicare Other | Admitting: Dietician

## 2016-12-09 ENCOUNTER — Encounter: Payer: Self-pay | Admitting: Dietician

## 2016-12-09 VITALS — Ht 61.0 in | Wt 118.7 lb

## 2016-12-09 DIAGNOSIS — Z713 Dietary counseling and surveillance: Secondary | ICD-10-CM | POA: Diagnosis not present

## 2016-12-09 DIAGNOSIS — E119 Type 2 diabetes mellitus without complications: Secondary | ICD-10-CM

## 2016-12-09 DIAGNOSIS — Z794 Long term (current) use of insulin: Principal | ICD-10-CM

## 2016-12-09 NOTE — Progress Notes (Signed)
Medical Nutrition Therapy: Visit start time: 1330  end time: 1430  Assessment:  Diagnosis: Type 2 Diabetes Past medical history: HTN, CHF, hyerlipidemia Psychosocial issues/ stress concerns: none Preferred learning method:  . Auditory . Visual  Current weight: 118.7lbs  Height: 5'1" Medications, supplements: reconciled list in medical record  Progress and evaluation: Patient met with RN for Diabetes visit on 12/01/16. She has had elevated BGs since developing UTI; reports fasting BGs currently in 130s - 150s. She reports eating small food portions, but does eat regularly. She loves veg and fruits, not much meat, occasionally chicken or shrimp. Denies any recent diet changes. States Little Debbie snack cakes are a weakness but she limits them. She has awoken during the night with low BG symptoms in the past, but not recently.    Physical activity: walking 10-15 minutes, average 4 times a week. Reports frequent activity throughout the day   Dietary Intake:  Usual eating pattern includes 3 meals and 3 snacks per day. Dining out frequency: 1-2 meals per week.  Breakfast: grits and raisins with sugar free syrup; Cheerios cereal with raisins  Snack: banana Lunch: sandwich with lettuce and tomato, sometimes with chips Snack: grapes or banana Supper: chicken stew and grapes; pinto beans cabbage and corn bread; bologna or ham sandwich; daughter sometimes cooks-- i.e. Beef stroganoff; spaghetti, mac and cheese, occasionally french fries Snack: cucumbers with ranch dressing Beverages: water, diet tea with lemon, milk, 1c coffee,   Nutrition Care Education: Topics covered: Diabetes Basic nutrition: basic food groups, appropriate nutrient balance, appropriate meal and snack schedule, general nutrition guidelines    Diabetes: appropriate meal and snack schedule, appropriate carb intake and balance, basic meal planning using plate method, food models, and menus.   Nutritional Diagnosis:  [place]-2.2  Altered nutrition-related laboratory As related to Diabetes.  As evidenced by patient report.  Intervention: Instruction as noted above.   Advised increased protein intake to ensure nutritional adequacy and reduce risk of hypoglycemia.    No follow-up scheduled at this time; patient will call with any questions.   Education Materials given:  . Plate Planner . Food lists--Carb Counting and Meal Planning [employer]) . Sample menus . Goals/ instructions  Learner/ who was taught:  . Patient   Level of understanding: Marland Kitchen Verbalizes/ demonstrates competency  Demonstrated degree of understanding via:   Teach back Learning barriers: . None  Willingness to learn/ readiness for change: . Eager, change in progress  Monitoring and Evaluation:  Dietary intake, exercise, BG control, and body weight      follow up: prn

## 2016-12-09 NOTE — Patient Instructions (Signed)
   Include a snack at bedtime with some starch and protein, such as crackers and peanut butter, or bread and cheese, or fruit and nuts, or yogurt, or milk and graham crackers or vanilla wafers, or popcorn and nuts.   Make sure to have a protein food with each meal to steady blood sugar and keep up your strength. I you don't include meat with a meal, make sure to have some beans or peas for protein (but they also count as starch).

## 2016-12-16 ENCOUNTER — Other Ambulatory Visit (INDEPENDENT_AMBULATORY_CARE_PROVIDER_SITE_OTHER): Payer: Self-pay | Admitting: Vascular Surgery

## 2016-12-16 DIAGNOSIS — I739 Peripheral vascular disease, unspecified: Secondary | ICD-10-CM

## 2016-12-16 DIAGNOSIS — Z48812 Encounter for surgical aftercare following surgery on the circulatory system: Secondary | ICD-10-CM

## 2016-12-16 DIAGNOSIS — I6523 Occlusion and stenosis of bilateral carotid arteries: Secondary | ICD-10-CM

## 2016-12-17 ENCOUNTER — Ambulatory Visit (INDEPENDENT_AMBULATORY_CARE_PROVIDER_SITE_OTHER): Payer: Medicare Other

## 2016-12-17 ENCOUNTER — Encounter (INDEPENDENT_AMBULATORY_CARE_PROVIDER_SITE_OTHER): Payer: Self-pay | Admitting: Vascular Surgery

## 2016-12-17 ENCOUNTER — Ambulatory Visit (INDEPENDENT_AMBULATORY_CARE_PROVIDER_SITE_OTHER): Payer: Medicare Other | Admitting: Vascular Surgery

## 2016-12-17 ENCOUNTER — Encounter (INDEPENDENT_AMBULATORY_CARE_PROVIDER_SITE_OTHER): Payer: Self-pay

## 2016-12-17 VITALS — BP 161/70 | HR 54 | Resp 16 | Ht 61.5 in | Wt 117.0 lb

## 2016-12-17 DIAGNOSIS — I10 Essential (primary) hypertension: Secondary | ICD-10-CM | POA: Diagnosis not present

## 2016-12-17 DIAGNOSIS — E782 Mixed hyperlipidemia: Secondary | ICD-10-CM

## 2016-12-17 DIAGNOSIS — E1165 Type 2 diabetes mellitus with hyperglycemia: Secondary | ICD-10-CM

## 2016-12-17 DIAGNOSIS — Z794 Long term (current) use of insulin: Secondary | ICD-10-CM

## 2016-12-17 DIAGNOSIS — E1142 Type 2 diabetes mellitus with diabetic polyneuropathy: Secondary | ICD-10-CM

## 2016-12-17 DIAGNOSIS — I70213 Atherosclerosis of native arteries of extremities with intermittent claudication, bilateral legs: Secondary | ICD-10-CM

## 2016-12-17 DIAGNOSIS — Z48812 Encounter for surgical aftercare following surgery on the circulatory system: Secondary | ICD-10-CM | POA: Diagnosis not present

## 2016-12-17 DIAGNOSIS — I739 Peripheral vascular disease, unspecified: Secondary | ICD-10-CM

## 2016-12-17 DIAGNOSIS — IMO0002 Reserved for concepts with insufficient information to code with codable children: Secondary | ICD-10-CM

## 2016-12-17 DIAGNOSIS — I6523 Occlusion and stenosis of bilateral carotid arteries: Secondary | ICD-10-CM

## 2016-12-17 DIAGNOSIS — I2510 Atherosclerotic heart disease of native coronary artery without angina pectoris: Secondary | ICD-10-CM | POA: Diagnosis not present

## 2016-12-20 DIAGNOSIS — I6523 Occlusion and stenosis of bilateral carotid arteries: Secondary | ICD-10-CM | POA: Insufficient documentation

## 2016-12-20 DIAGNOSIS — I70219 Atherosclerosis of native arteries of extremities with intermittent claudication, unspecified extremity: Secondary | ICD-10-CM | POA: Insufficient documentation

## 2016-12-20 NOTE — Progress Notes (Signed)
MRN : [id number]  Jill Hudson is a 82 y.o. (Jun 02, 1935) female who presents with chief complaint of  Chief Complaint  Patient presents with  . Re-evaluation    Ultrasound follow up, ABI, carotid  .  History of Present Illness: The patient returns to the office for followup and review of the noninvasive studies. There have been no interval changes in lower extremity symptoms. No interval shortening of the patient's claudication distance or development of rest pain symptoms. No new ulcers or wounds have occurred since the last visit.  There have been no significant changes to the patient's overall health care.  The patient denies amaurosis fugax or recent TIA symptoms. There are no recent neurological changes noted.  The patient denies history of DVT, PE or superficial thrombophlebitis. The patient denies recent episodes of angina or shortness of breath.   ABI Rt=1.01 and Lt=0.59    Carotid duplex shows 56-43% RICA and <32% LICA  Current Meds  Medication Sig  . Acetaminophen (TYLENOL PO) Take by mouth. Takes one 650 mg tablet + one 500 ng tablet at bedtime  . aspirin 81 MG tablet Take 1 tablet by mouth daily.  . B Complex Vitamins (VITAMIN-B COMPLEX) TABS Take 1 tablet by mouth daily.   . Biotin 10 MG TABS Take 1 tablet by mouth daily.   . ciprofloxacin (CIPRO) 250 MG tablet Take 1 tablet (250 mg total) by mouth 2 (two) times daily.  . clopidogrel (PLAVIX) 75 MG tablet Take 1 tablet by mouth daily.  . Cyanocobalamin (RA VITAMIN B-12 TR) 1000 MCG TBCR Take 1 tablet by mouth daily.   Marland Kitchen diltiazem (CARTIA XT) 180 MG 24 hr capsule Take 1 tablet by mouth 2 (two) times daily.  Marland Kitchen glimepiride (AMARYL) 4 MG tablet TAKE 1 TABLET BY MOUTH TWICE DAILY  . Insulin Glargine (LANTUS SOLOSTAR) 100 UNIT/ML Solostar Pen Inject 8 Units into the skin 2 (two) times daily. TAKE 4 units in AM and 8 units daily at bedtime  . insulin glargine (LANTUS) 100 unit/mL SOPN Inject into the skin.  Marland Kitchen losartan  (COZAAR) 50 MG tablet Take 50 mg by mouth daily.  . metFORMIN (GLUCOPHAGE-XR) 500 MG 24 hr tablet TAKE 1 TABLET BY MOUTH TWICE DAILY  . Multiple Vitamin (MULTI-VITAMINS) TABS Take 1 tablet by mouth daily.   . Multiple Vitamins-Minerals (PRESERVISION AREDS PO) Take 1 tablet by mouth 2 (two) times daily.  . nitroGLYCERIN (NITROSTAT) 0.4 MG SL tablet PLACE 1 TABLET UNDER THE TONGUE FOR CHEST PAIN, MAY REPEAT IN 5 MINUTES FOR UP TO 3 DOSES  . Omega 3 1000 MG CAPS Take 1 capsule by mouth daily.   . penicillin v potassium (VEETID) 500 MG tablet Take 1 tablet by mouth 4 (four) times daily.  . pravastatin (PRAVACHOL) 20 MG tablet   . traMADol (ULTRAM) 50 MG tablet Take 50 mg by mouth as needed.     Past Medical History:  Diagnosis Date  . Anxiety   . Arthritis   . Diabetes mellitus without complication ([place])   . Hyperlipidemia   . Hypertension   . Mitral and aortic valve disease   . Occasional tremors   . Shingles   . UTI (urinary tract infection)   . Vascular disease, peripheral [facility])     Past Surgical History:  Procedure Laterality Date  . CARDIAC CATHETERIZATION  1998   40% LM, 95% Ramus interm  . PTCA  08/2013   Left common iliac  . PTCA  12/2012  left ext iliac    Social History Social History  Substance Use Topics  . Smoking status: Former Research scientist (life sciences)  . Smokeless tobacco: Never Used  . Alcohol use No    Family History Family History  Problem Relation Age of Onset  . Dementia Mother   . Diabetes Father   No family history of bleeding/clotting disorders, porphyria or autoimmune disease   Allergies  Allergen Reactions  . Saxagliptin Diarrhea  . Codeine     Upset stomach  . Epinephrine   . Ezetimibe Other (See Comments)    myalgias  . Nitrofurantoin     Pruitus, rash  . Limonene Rash  . Sulfa Antibiotics Rash and Other (See Comments)    Sore mouth      REVIEW OF SYSTEMS (Negative unless checked)  Constitutional: [] Weight loss  [] Fever  [] Chills Cardiac:  [] Chest pain   [] Chest pressure   [] Palpitations   [] Shortness of breath when laying flat   [] Shortness of breath with exertion. Vascular:  [x] Pain in legs with walking   [] Pain in legs at rest  [] History of DVT   [] Phlebitis   [] Swelling in legs   [] Varicose veins   [] Non-healing ulcers Pulmonary:   [] Uses home oxygen   [] Productive cough   [] Hemoptysis   [] Wheeze  [] COPD   [] Asthma Neurologic:  [] Dizziness   [] Seizures   [] History of stroke   [] History of TIA  [] Aphasia   [] Vissual changes   [] Weakness or numbness in arm   [] Weakness or numbness in leg Musculoskeletal:   [] Joint swelling   [] Joint pain   [] Low back pain Hematologic:  [] Easy bruising  [] Easy bleeding   [] Hypercoagulable state   [] Anemic Gastrointestinal:  [] Diarrhea   [] Vomiting  [] Gastroesophageal reflux/heartburn   [] Difficulty swallowing. Genitourinary:  [] Chronic kidney disease   [] Difficult urination  [] Frequent urination   [] Blood in urine Skin:  [] Rashes   [] Ulcers  Psychological:  [] History of anxiety   []  History of major depression.  Physical Examination  Vitals:   12/17/16 1443 12/17/16 1444  BP: (!) 183/72 (!) 161/70  Pulse: (!) 59 (!) 54  Resp: 16   Weight: 117 lb (53.1 kg)   Height: 5' 1.5" (1.562 m)    Body mass index is 21.75 kg/m. Gen: WD/WN, NAD Head: [place]/AT, No temporalis wasting.  Ear/Nose/Throat: Hearing grossly intact, nares w/o erythema or drainage, poor dentition Eyes: PER, EOMI, sclera nonicteric.  Neck: Supple, no masses.  No bruit or JVD.  Pulmonary:  Good air movement, clear to auscultation bilaterally, no use of accessory muscles.  Cardiac: RRR, normal S1, S2, no Murmurs. Vascular: right carotid bruit Vessel Right Left  Radial Palpable Palpable  Ulnar Palpable Palpable  Brachial Palpable Palpable  Carotid Palpable Palpable  Femoral Palpable Palpable  Popliteal Palpable Not Palpable  PT Palpable Not Palpable  DP Palpable Not Palpable   Gastrointestinal: soft, non-distended. No  guarding/no peritoneal signs.  Musculoskeletal: M/S 5/5 throughout.  No deformity or atrophy.  Neurologic: CN 2-12 intact. Pain and light touch intact in extremities.  Symmetrical.  Speech is fluent. Motor exam as listed above. Psychiatric: Judgment intact, Mood & affect appropriate for pt's clinical situation. Dermatologic: No rashes or ulcers noted.  No changes consistent with cellulitis. Lymph : No Cervical lymphadenopathy, no lichenification or skin changes of chronic lymphedema.  CBC Lab Results  Component Value Date   WBC 9.0 02/06/2016   HGB 11.2 (L) 02/06/2016   HCT 34.0 (L) 02/06/2016   MCV 87.5 02/06/2016   PLT 268  02/06/2016    BMET    Component Value Date/Time   NA 132 (L) 02/06/2016 1904   NA 136 01/16/2016 1135   NA 138 05/29/2014 1202   K 5.5 (H) 02/06/2016 1904   K 5.1 05/29/2014 1202   CL 103 02/06/2016 1904   CL 106 05/29/2014 1202   CO2 23 02/06/2016 1904   CO2 25 05/29/2014 1202   GLUCOSE 171 (H) 02/06/2016 1904   GLUCOSE 269 (H) 05/29/2014 1202   BUN 27 (H) 02/21/2016 1454   BUN 19 01/16/2016 1135   BUN 19 (H) 05/29/2014 1202   CREATININE 1.28 (H) 02/21/2016 1454   CREATININE 1.10 05/29/2014 1202   CALCIUM 9.3 02/06/2016 1904   CALCIUM 9.1 05/29/2014 1202   GFRNONAA 39 (L) 02/21/2016 1454   GFRNONAA 48 (L) 05/29/2014 1202   GFRAA 45 (L) 02/21/2016 1454   GFRAA 56 (L) 05/29/2014 1202   CrCl cannot be calculated (Patient's most recent lab result is older than the maximum 21 days allowed.).  COAG Lab Results  Component Value Date   INR 1.0 05/03/2013    Radiology No results found.  Assessment/Plan 1. Atherosclerosis of native artery of both lower extremities with intermittent claudication (HCC)  Recommend:  The patient has evidence of atherosclerosis of the lower extremities with claudication.  The patient does not voice lifestyle limiting changes at this point in time.  Noninvasive studies do not suggest clinically significant  change.  No invasive studies, angiography or surgery at this time The patient should continue walking and begin a more formal exercise program.  The patient should continue antiplatelet therapy and aggressive treatment of the lipid abnormalities  No changes in the patient's medications at this time  The patient should continue wearing graduated compression socks 10-15 mmHg strength to control the mild edema.    2. Essential (primary) hypertension Continue antihypertensive medications as already ordered, these medications have been reviewed and there are no changes at this time.   3. Arteriosclerosis of coronary artery Continue cardiac and antihypertensive medications as already ordered and reviewed, no changes at this time.  Continue statin as ordered and reviewed, no changes at this time  Nitrates PRN for chest pain    4. Uncontrolled type 2 diabetes mellitus with diabetic polyneuropathy, with long-term current use of insulin (HCC) Continue hypoglycemic medications as already ordered, these medications have been reviewed and there are no changes at this time.  Hgb A1C to be monitored as already arranged by primary service   5. Mixed hyperlipidemia Continue statin as ordered and reviewed, no changes at this time   6.  Carotid Stenosis: Recommend:  Given the patient's asymptomatic subcritical stenosis no further invasive testing or surgery at this time.  Duplex ultrasound shows <60% stenosis bilaterally.  Continue antiplatelet therapy as prescribed Continue management of CAD, HTN and Hyperlipidemia Healthy heart diet,  encouraged exercise at least 4 times per week Follow up in 6 months with duplex ultrasound and physical exam based on >50% stenosis of the RICA carotid artery     Hortencia Pilar, MD  12/20/2016 5:07 PM

## 2016-12-21 ENCOUNTER — Ambulatory Visit (INDEPENDENT_AMBULATORY_CARE_PROVIDER_SITE_OTHER): Payer: Medicare Other | Admitting: Internal Medicine

## 2016-12-21 ENCOUNTER — Encounter: Payer: Self-pay | Admitting: Internal Medicine

## 2016-12-21 ENCOUNTER — Other Ambulatory Visit: Payer: Self-pay | Admitting: Internal Medicine

## 2016-12-21 VITALS — BP 142/58 | HR 64 | Resp 12 | Ht 61.0 in | Wt 118.0 lb

## 2016-12-21 DIAGNOSIS — I6523 Occlusion and stenosis of bilateral carotid arteries: Secondary | ICD-10-CM

## 2016-12-21 DIAGNOSIS — E1142 Type 2 diabetes mellitus with diabetic polyneuropathy: Secondary | ICD-10-CM

## 2016-12-21 DIAGNOSIS — N3000 Acute cystitis without hematuria: Secondary | ICD-10-CM | POA: Diagnosis not present

## 2016-12-21 DIAGNOSIS — IMO0002 Reserved for concepts with insufficient information to code with codable children: Secondary | ICD-10-CM

## 2016-12-21 DIAGNOSIS — E1165 Type 2 diabetes mellitus with hyperglycemia: Secondary | ICD-10-CM

## 2016-12-21 DIAGNOSIS — M5136 Other intervertebral disc degeneration, lumbar region: Secondary | ICD-10-CM | POA: Insufficient documentation

## 2016-12-21 DIAGNOSIS — N761 Subacute and chronic vaginitis: Secondary | ICD-10-CM | POA: Diagnosis not present

## 2016-12-21 DIAGNOSIS — M51369 Other intervertebral disc degeneration, lumbar region without mention of lumbar back pain or lower extremity pain: Secondary | ICD-10-CM | POA: Insufficient documentation

## 2016-12-21 DIAGNOSIS — Z794 Long term (current) use of insulin: Secondary | ICD-10-CM

## 2016-12-21 LAB — POCT URINALYSIS DIPSTICK
Bilirubin, UA: NEGATIVE
KETONES UA: NEGATIVE
Nitrite, UA: NEGATIVE
PROTEIN UA: NEGATIVE
RBC UA: NEGATIVE
SPEC GRAV UA: 1.01
Urobilinogen, UA: 0.2
pH, UA: 5

## 2016-12-21 MED ORDER — CIPROFLOXACIN HCL 250 MG PO TABS: 250.0000 mg | ORAL_TABLET | Freq: Two times a day (BID) | ORAL | 0 refills | Status: DC

## 2016-12-21 MED ORDER — NYSTATIN-TRIAMCINOLONE 100000-0.1 UNIT/GM-% EX CREA: 1.0000 "application " | TOPICAL_CREAM | Freq: Two times a day (BID) | CUTANEOUS | 0 refills | Status: DC

## 2016-12-21 NOTE — Patient Instructions (Signed)
Insurance will no longer cover Lantus.   They will cover Basaglar, Levemir and [place].

## 2016-12-21 NOTE — Progress Notes (Signed)
Date:  12/21/2016   Name:  Jill Hudson   DOB:  1934/11/27   MRN:  [id number]   Chief Complaint: Urinary Tract Infection (itching and burning X 2 days.) and Diabetes (Sugars running high since being taken off Glimipiride by Dr. Jinny Blossom.) Urinary Tract Infection   This is a recurrent problem. The current episode started yesterday. The problem occurs every urination. Quality: tingling. There has been no fever. Associated symptoms include frequency. Pertinent negatives include no chills, hematuria or vomiting.  Back Pain  This is a chronic problem. The problem occurs intermittently. The problem has been waxing and waning since onset. The quality of the pain is described as aching. The pain is mild. Pertinent negatives include no chest pain or headaches. Improvement on treatment: MRI done by Ortho -  Diabetes  She presents for her follow-up diabetic visit. She has type 2 (being seen by Endo now) diabetes mellitus. Her disease course has been worsening. Pertinent negatives for hypoglycemia include no dizziness or headaches. Pertinent negatives for diabetes include no chest pain and no fatigue. Current diabetic treatment includes insulin injections. She is compliant with treatment all of the time. Her home blood glucose trend is fluctuating dramatically.  Vaginitis - has has intermittent itching in the genital area - seen in past by GYN with no definitive diagnosis.  She has used nystatin/tac in the past.  Review of Systems  Constitutional: Negative for chills and fatigue.  Respiratory: Negative for chest tightness, shortness of breath and wheezing.   Cardiovascular: Positive for palpitations. Negative for chest pain.  Gastrointestinal: Negative for vomiting.  Genitourinary: Positive for frequency. Negative for hematuria.  Musculoskeletal: Positive for back pain.  Neurological: Negative for dizziness and headaches.    Patient Active Problem List   Diagnosis Date Noted  . Atherosclerosis of  native arteries of extremity with intermittent claudication ([place]) 12/20/2016  . Bilateral carotid artery stenosis 12/20/2016  . Hip bursitis 05/18/2016  . Elevated TSH 01/18/2016  . Chronic renal insufficiency, stage III (moderate) 01/16/2016  . Senile ecchymosis 01/16/2016  . Uncontrolled type 2 diabetes mellitus with diabetic polyneuropathy, with long-term current use of insulin ([place]) 09/16/2015  . GERD (gastroesophageal reflux disease) 07/24/2015  . Peripheral blood vessel disorder ([place]) 05/17/2015  . Neoplasm of uncertain behavior of skin 05/17/2015  . Background retinopathy due to secondary diabetes ([place]) 04/11/2015  . Diabetes mellitus with polyneuropathy ([place]) 04/11/2015  . HLD (hyperlipidemia) 04/11/2015  . Essential (primary) hypertension 04/11/2015  . Generalized OA 04/11/2015  . Arterial insufficiency ([place]) 04/11/2015  . Arteriosclerosis of coronary artery 05/29/2013  . Hypertensive heart disease without CHF 05/29/2013    Prior to Admission medications   Medication Sig Start Date End Date Taking? Authorizing Provider  Acetaminophen (TYLENOL PO) Take by mouth. Takes one 650 mg tablet + one 500 ng tablet at bedtime   Yes Historical Provider, MD  aspirin 81 MG tablet Take 1 tablet by mouth daily.   Yes Historical Provider, MD  B Complex Vitamins (VITAMIN-B COMPLEX) TABS Take 1 tablet by mouth daily.    Yes Historical Provider, MD  Biotin 10 MG TABS Take 1 tablet by mouth daily.    Yes Historical Provider, MD  ciprofloxacin (CIPRO) 250 MG tablet Take 1 tablet (250 mg total) by mouth 2 (two) times daily. 12/01/16  Yes [name], MD  clopidogrel (PLAVIX) 75 MG tablet Take 1 tablet by mouth daily.   Yes Historical Provider, MD  Cyanocobalamin (RA VITAMIN B-12 TR) 1000 MCG TBCR Take  1 tablet by mouth daily.    Yes Historical Provider, MD  diltiazem (CARTIA XT) 180 MG 24 hr capsule Take 1 tablet by mouth 2 (two) times daily.   Yes Historical Provider, MD  Insulin Glargine (LANTUS  SOLOSTAR) 100 UNIT/ML Solostar Pen Inject 8 Units into the skin 2 (two) times daily. TAKE 4 units in AM and 8 units daily at bedtime   Yes Historical Provider, MD  insulin glargine (LANTUS) 100 unit/mL SOPN Inject into the skin.   Yes Historical Provider, MD  losartan (COZAAR) 50 MG tablet Take 50 mg by mouth daily. 11/27/16 11/27/17 Yes Historical Provider, MD  metFORMIN (GLUCOPHAGE-XR) 500 MG 24 hr tablet TAKE 1 TABLET BY MOUTH TWICE DAILY 11/25/15  Yes [name], MD  Multiple Vitamin (MULTI-VITAMINS) TABS Take 1 tablet by mouth daily.    Yes Historical Provider, MD  Multiple Vitamins-Minerals (PRESERVISION AREDS PO) Take 1 tablet by mouth 2 (two) times daily.   Yes Historical Provider, MD  nitroGLYCERIN (NITROSTAT) 0.4 MG SL tablet PLACE 1 TABLET UNDER THE TONGUE FOR CHEST PAIN, MAY REPEAT IN 5 MINUTES FOR UP TO 3 DOSES 11/18/16  Yes [name], MD  Omega 3 1000 MG CAPS Take 1 capsule by mouth daily.  12/12/09  Yes Historical Provider, MD  penicillin v potassium (VEETID) 500 MG tablet Take 1 tablet by mouth 4 (four) times daily. 09/22/16  Yes Historical Provider, MD  pravastatin (PRAVACHOL) 20 MG tablet  02/13/16  Yes Historical Provider, MD  traMADol (ULTRAM) 50 MG tablet Take 50 mg by mouth as needed.  09/16/16  Yes Historical Provider, MD  gabapentin (NEURONTIN) 100 MG capsule Take 100 mg by mouth at bedtime as needed.  09/16/16 11/15/16  Historical Provider, MD  glimepiride (AMARYL) 4 MG tablet TAKE 1 TABLET BY MOUTH TWICE DAILY Patient not taking: Reported on 12/21/2016 10/08/15   [name], MD    Allergies  Allergen Reactions  . Saxagliptin Diarrhea  . Codeine     Upset stomach  . Epinephrine   . Ezetimibe Other (See Comments)    myalgias  . Nitrofurantoin     Pruitus, rash  . Limonene Rash  . Sulfa Antibiotics Rash and Other (See Comments)    Sore mouth     Past Surgical History:  Procedure Laterality Date  . CARDIAC CATHETERIZATION  1998   40% LM, 95% Ramus  interm  . PTCA  08/2013   Left common iliac  . PTCA  12/2012   left ext iliac    Social History  Substance Use Topics  . Smoking status: Former Research scientist (life sciences)  . Smokeless tobacco: Never Used  . Alcohol use No     Medication list has been reviewed and updated.   Physical Exam  Constitutional: She is oriented to person, place, and time. She appears well-developed. No distress.  HENT:  Head: Normocephalic and atraumatic.  Neck: Normal range of motion. Neck supple.  Cardiovascular: Normal rate, regular rhythm and normal heart sounds.   Pulmonary/Chest: Effort normal and breath sounds normal. No respiratory distress. She has no wheezes.  Abdominal: Soft. Bowel sounds are normal. She exhibits no distension. There is no tenderness.  Musculoskeletal: She exhibits no edema or tenderness.  Neurological: She is alert and oriented to person, place, and time.  Skin: Skin is warm and dry. No rash noted.  Psychiatric: She has a normal mood and affect. Her behavior is normal. Thought content normal.  Nursing note and vitals reviewed.  MRI done 10/22/16: IMPRESSION:  1. Multilevel spondylosis of the lumbar spine as described. 2. Mild subarticular narrowing at L2-3 is worse on the left. Mild foraminal narrowing at L2-3 is worse on the right. 3. Mild left subarticular and foraminal stenosis at L3-4. 4. Mild bilateral subarticular and foraminal stenosis at L4-5. 5. Mild left subarticular narrowing at L5-S1. 6. Mild foraminal stenosis bilaterally at L5-S1. Facet spurring is worse on the right. 7. Bilateral renal cysts.  BP (!) 142/58   Pulse 64   Resp 12   Ht 5\' 1"  (1.549 m)   Wt 118 lb (53.5 kg)   BMI 22.30 kg/m   Assessment and Plan: 1. Acute cystitis without hematuria Treat with Cipro - POCT Urinalysis Dipstick - Urine culture  2. Degenerative disc disease, lumbar MRI results reviewed  3. Subacute vaginitis Continue topical PRN  4. Uncontrolled type 2 diabetes mellitus with  diabetic polyneuropathy, with long-term current use of insulin (HCC) Continue insulin Remain off of SNU Follow up with Endo regarding Insulin change due to insurance and dosing adjustments   Meds ordered this encounter  Medications  . nystatin-triamcinolone (MYCOLOG II) cream    Sig: Apply 1 application topically 2 (two) times daily.    Dispense:  30 g    Refill:  0  . ciprofloxacin (CIPRO) 250 MG tablet    Sig: Take 1 tablet (250 mg total) by mouth 2 (two) times daily.    Dispense:  14 tablet    Refill:  0    [name], MD [facility]  12/21/2016

## 2016-12-24 ENCOUNTER — Other Ambulatory Visit: Payer: Self-pay | Admitting: Internal Medicine

## 2016-12-24 LAB — URINE CULTURE

## 2017-01-08 DIAGNOSIS — E1165 Type 2 diabetes mellitus with hyperglycemia: Secondary | ICD-10-CM | POA: Diagnosis not present

## 2017-01-08 DIAGNOSIS — R946 Abnormal results of thyroid function studies: Secondary | ICD-10-CM | POA: Diagnosis not present

## 2017-01-08 DIAGNOSIS — Z794 Long term (current) use of insulin: Secondary | ICD-10-CM | POA: Diagnosis not present

## 2017-01-24 DIAGNOSIS — G459 Transient cerebral ischemic attack, unspecified: Secondary | ICD-10-CM

## 2017-01-24 DIAGNOSIS — I639 Cerebral infarction, unspecified: Secondary | ICD-10-CM

## 2017-01-24 HISTORY — DX: Cerebral infarction, unspecified: I63.9

## 2017-01-24 HISTORY — DX: Transient cerebral ischemic attack, unspecified: G45.9

## 2017-02-09 ENCOUNTER — Other Ambulatory Visit: Payer: Self-pay | Admitting: Internal Medicine

## 2017-02-09 DIAGNOSIS — IMO0002 Reserved for concepts with insufficient information to code with codable children: Secondary | ICD-10-CM

## 2017-02-09 DIAGNOSIS — E1165 Type 2 diabetes mellitus with hyperglycemia: Secondary | ICD-10-CM

## 2017-02-09 DIAGNOSIS — E1142 Type 2 diabetes mellitus with diabetic polyneuropathy: Secondary | ICD-10-CM

## 2017-02-15 ENCOUNTER — Inpatient Hospital Stay
Admission: EM | Admit: 2017-02-15 | Discharge: 2017-02-16 | DRG: 065 | Disposition: A | Discharge status: Home Health | Payer: Medicare Other | Attending: Specialist | Admitting: Specialist

## 2017-02-15 ENCOUNTER — Inpatient Hospital Stay
Admit: 2017-02-15 | Discharge: 2017-02-15 | Disposition: A | Discharge status: Home / Self Care | Payer: Medicare Other | Attending: Internal Medicine | Admitting: Internal Medicine

## 2017-02-15 ENCOUNTER — Encounter: Payer: Self-pay | Admitting: Emergency Medicine

## 2017-02-15 ENCOUNTER — Emergency Department: Payer: Medicare Other

## 2017-02-15 DIAGNOSIS — E1151 Type 2 diabetes mellitus with diabetic peripheral angiopathy without gangrene: Secondary | ICD-10-CM | POA: Diagnosis present

## 2017-02-15 DIAGNOSIS — G8194 Hemiplegia, unspecified affecting left nondominant side: Secondary | ICD-10-CM | POA: Diagnosis present

## 2017-02-15 DIAGNOSIS — I6523 Occlusion and stenosis of bilateral carotid arteries: Secondary | ICD-10-CM | POA: Diagnosis present

## 2017-02-15 DIAGNOSIS — Z7982 Long term (current) use of aspirin: Secondary | ICD-10-CM | POA: Diagnosis not present

## 2017-02-15 DIAGNOSIS — I639 Cerebral infarction, unspecified: Secondary | ICD-10-CM

## 2017-02-15 DIAGNOSIS — Z882 Allergy status to sulfonamides status: Secondary | ICD-10-CM | POA: Diagnosis not present

## 2017-02-15 DIAGNOSIS — Z87891 Personal history of nicotine dependence: Secondary | ICD-10-CM | POA: Diagnosis not present

## 2017-02-15 DIAGNOSIS — R531 Weakness: Secondary | ICD-10-CM | POA: Diagnosis not present

## 2017-02-15 DIAGNOSIS — E1140 Type 2 diabetes mellitus with diabetic neuropathy, unspecified: Secondary | ICD-10-CM | POA: Diagnosis present

## 2017-02-15 DIAGNOSIS — I080 Rheumatic disorders of both mitral and aortic valves: Secondary | ICD-10-CM | POA: Diagnosis present

## 2017-02-15 DIAGNOSIS — Z885 Allergy status to narcotic agent status: Secondary | ICD-10-CM | POA: Diagnosis not present

## 2017-02-15 DIAGNOSIS — M79605 Pain in left leg: Secondary | ICD-10-CM | POA: Diagnosis not present

## 2017-02-15 DIAGNOSIS — Z794 Long term (current) use of insulin: Secondary | ICD-10-CM | POA: Diagnosis not present

## 2017-02-15 DIAGNOSIS — I6501 Occlusion and stenosis of right vertebral artery: Secondary | ICD-10-CM | POA: Diagnosis not present

## 2017-02-15 DIAGNOSIS — E1142 Type 2 diabetes mellitus with diabetic polyneuropathy: Secondary | ICD-10-CM

## 2017-02-15 DIAGNOSIS — I63531 Cerebral infarction due to unspecified occlusion or stenosis of right posterior cerebral artery: Principal | ICD-10-CM | POA: Diagnosis present

## 2017-02-15 DIAGNOSIS — Z79899 Other long term (current) drug therapy: Secondary | ICD-10-CM | POA: Diagnosis not present

## 2017-02-15 DIAGNOSIS — Z7902 Long term (current) use of antithrombotics/antiplatelets: Secondary | ICD-10-CM | POA: Diagnosis not present

## 2017-02-15 DIAGNOSIS — E785 Hyperlipidemia, unspecified: Secondary | ICD-10-CM | POA: Diagnosis present

## 2017-02-15 DIAGNOSIS — Z888 Allergy status to other drugs, medicaments and biological substances status: Secondary | ICD-10-CM

## 2017-02-15 DIAGNOSIS — Z8673 Personal history of transient ischemic attack (TIA), and cerebral infarction without residual deficits: Secondary | ICD-10-CM | POA: Diagnosis present

## 2017-02-15 DIAGNOSIS — I10 Essential (primary) hypertension: Secondary | ICD-10-CM | POA: Diagnosis present

## 2017-02-15 DIAGNOSIS — I638 Other cerebral infarction: Secondary | ICD-10-CM | POA: Diagnosis not present

## 2017-02-15 DIAGNOSIS — R29702 NIHSS score 2: Secondary | ICD-10-CM | POA: Diagnosis present

## 2017-02-15 DIAGNOSIS — R9082 White matter disease, unspecified: Secondary | ICD-10-CM | POA: Diagnosis not present

## 2017-02-15 DIAGNOSIS — E119 Type 2 diabetes mellitus without complications: Secondary | ICD-10-CM | POA: Diagnosis not present

## 2017-02-15 DIAGNOSIS — I2510 Atherosclerotic heart disease of native coronary artery without angina pectoris: Secondary | ICD-10-CM | POA: Diagnosis present

## 2017-02-15 DIAGNOSIS — Z7984 Long term (current) use of oral hypoglycemic drugs: Secondary | ICD-10-CM | POA: Diagnosis not present

## 2017-02-15 DIAGNOSIS — Z95820 Peripheral vascular angioplasty status with implants and grafts: Secondary | ICD-10-CM

## 2017-02-15 LAB — BASIC METABOLIC PANEL
Anion gap: 10 (ref 5–15)
BUN: 20 mg/dL (ref 6–20)
CALCIUM: 9.4 mg/dL (ref 8.9–10.3)
CO2: 23 mmol/L (ref 22–32)
CREATININE: 0.86 mg/dL (ref 0.44–1.00)
Chloride: 110 mmol/L (ref 101–111)
GFR calc Af Amer: >60 mL/min (ref >60)
GLUCOSE: 163 mg/dL — AB (ref 65–99)
Potassium: 4.7 mmol/L (ref 3.5–5.1)
Sodium: 143 mmol/L (ref 135–145)

## 2017-02-15 LAB — CBC
HEMATOCRIT: 34.7 % — AB (ref 35.0–47.0)
Hemoglobin: 11.6 g/dL — ABNORMAL LOW (ref 12.0–16.0)
MCH: 29.5 pg (ref 26.0–34.0)
MCHC: 33.3 g/dL (ref 32.0–36.0)
MCV: 88.5 fL (ref 80.0–100.0)
PLATELETS: 256 10*3/uL (ref 150–440)
RBC: 3.92 MIL/uL (ref 3.80–5.20)
RDW: 13.6 % (ref 11.5–14.5)
WBC: 8.4 10*3/uL (ref 3.6–11.0)

## 2017-02-15 LAB — APTT: aPTT: 33 seconds (ref 24–36)

## 2017-02-15 LAB — PROTIME-INR
INR: 1.06
PROTHROMBIN TIME: 13.8 s (ref 11.4–15.2)

## 2017-02-15 LAB — GLUCOSE, CAPILLARY
Glucose-Capillary: 85 mg/dL (ref 65–99)
Glucose-Capillary: 172 mg/dL — ABNORMAL HIGH (ref 65–99)

## 2017-02-15 LAB — URINALYSIS, ROUTINE W REFLEX MICROSCOPIC
BACTERIA UA: NONE SEEN
BILIRUBIN URINE: NEGATIVE
Glucose, UA: NEGATIVE mg/dL
Hgb urine dipstick: NEGATIVE
KETONES UR: NEGATIVE mg/dL
NITRITE: NEGATIVE
PROTEIN: 30 mg/dL — AB
SPECIFIC GRAVITY, URINE: 1.006 (ref 1.005–1.030)
Squamous Epithelial / LPF: NONE SEEN
pH: 7 (ref 5.0–8.0)

## 2017-02-15 MED ORDER — DILTIAZEM HCL ER COATED BEADS 180 MG PO CP24
180.0000 mg | ORAL_CAPSULE | Freq: Every day | ORAL | Status: DC
  Administered 2017-02-15 – 2017-02-16 (×2): 180 mg via ORAL
  Filled 2017-02-15 (×2): qty 1

## 2017-02-15 MED ORDER — ASPIRIN EC 81 MG PO TBEC
81.0000 mg | DELAYED_RELEASE_TABLET | Freq: Every day | ORAL | Status: DC
  Administered 2017-02-16: 10:00:00 81 mg via ORAL
  Filled 2017-02-15: qty 1

## 2017-02-15 MED ORDER — METFORMIN HCL ER 500 MG PO TB24
500.0000 mg | ORAL_TABLET | Freq: Two times a day (BID) | ORAL | Status: DC
  Administered 2017-02-15 – 2017-02-16 (×2): 500 mg via ORAL
  Filled 2017-02-15 (×2): qty 1

## 2017-02-15 MED ORDER — GABAPENTIN 100 MG PO CAPS
100.0000 mg | ORAL_CAPSULE | Freq: Every day | ORAL | Status: DC
  Administered 2017-02-15: 100 mg via ORAL
  Filled 2017-02-15: qty 1

## 2017-02-15 MED ORDER — BIOTIN 10 MG PO TABS: 1.0000 | ORAL_TABLET | Freq: Every day | ORAL | Status: DC

## 2017-02-15 MED ORDER — ACETAMINOPHEN 650 MG RE SUPP: 650.0000 mg | RECTAL | Status: DC | PRN

## 2017-02-15 MED ORDER — STROKE: EARLY STAGES OF RECOVERY BOOK
Freq: Once | Status: AC
  Administered 2017-02-15: 17:00:00

## 2017-02-15 MED ORDER — ENOXAPARIN SODIUM 40 MG/0.4ML ~~LOC~~ SOLN
40.0000 mg | SUBCUTANEOUS | Status: DC
  Administered 2017-02-15: 40 mg via SUBCUTANEOUS
  Filled 2017-02-15 (×2): qty 0.4

## 2017-02-15 MED ORDER — TRAMADOL HCL 50 MG PO TABS: 50.0000 mg | ORAL_TABLET | Freq: Four times a day (QID) | ORAL | Status: DC | PRN

## 2017-02-15 MED ORDER — PRAVASTATIN SODIUM 20 MG PO TABS
20.0000 mg | ORAL_TABLET | Freq: Every evening | ORAL | Status: DC
  Administered 2017-02-15: 20 mg via ORAL
  Filled 2017-02-15: qty 1

## 2017-02-15 MED ORDER — INSULIN ASPART 100 UNIT/ML ~~LOC~~ SOLN
0.0000 [IU] | Freq: Three times a day (TID) | SUBCUTANEOUS | Status: DC
  Administered 2017-02-16: 13:00:00 5 [IU] via SUBCUTANEOUS
  Filled 2017-02-15: qty 7

## 2017-02-15 MED ORDER — LOSARTAN POTASSIUM 50 MG PO TABS
50.0000 mg | ORAL_TABLET | Freq: Every day | ORAL | Status: DC
  Administered 2017-02-15 – 2017-02-16 (×2): 50 mg via ORAL
  Filled 2017-02-15 (×2): qty 1

## 2017-02-15 MED ORDER — ACETAMINOPHEN 325 MG PO TABS
650.0000 mg | ORAL_TABLET | ORAL | Status: DC | PRN
  Administered 2017-02-16: 650 mg via ORAL
  Filled 2017-02-15: qty 2

## 2017-02-15 MED ORDER — OMEGA-3-ACID ETHYL ESTERS 1 G PO CAPS
1.0000 | ORAL_CAPSULE | Freq: Every day | ORAL | Status: DC
Start: 2017-02-15 — End: 2017-02-16
  Administered 2017-02-15 – 2017-02-16 (×2): 1 g via ORAL
  Filled 2017-02-15 (×2): qty 1

## 2017-02-15 MED ORDER — ASPIRIN 81 MG PO CHEW
324.0000 mg | CHEWABLE_TABLET | Freq: Once | ORAL | Status: AC
  Administered 2017-02-15: 324 mg via ORAL
  Filled 2017-02-15: qty 4

## 2017-02-15 MED ORDER — SODIUM CHLORIDE 0.9 % IV SOLN
INTRAVENOUS | Status: DC
  Administered 2017-02-15: 17:00:00 via INTRAVENOUS

## 2017-02-15 MED ORDER — METFORMIN HCL ER 500 MG PO TB24: 500.0000 mg | ORAL_TABLET | Freq: Two times a day (BID) | ORAL | Status: DC

## 2017-02-15 MED ORDER — INSULIN GLARGINE 100 UNIT/ML ~~LOC~~ SOLN
8.0000 [IU] | Freq: Two times a day (BID) | SUBCUTANEOUS | Status: DC
  Administered 2017-02-15 – 2017-02-16 (×2): 8 [IU] via SUBCUTANEOUS
  Filled 2017-02-15 (×4): qty 0.08

## 2017-02-15 MED ORDER — B COMPLEX-C PO TABS
1.0000 | ORAL_TABLET | Freq: Every day | ORAL | Status: DC
  Administered 2017-02-16: 1 via ORAL
  Filled 2017-02-15: qty 1

## 2017-02-15 MED ORDER — CLOPIDOGREL BISULFATE 75 MG PO TABS
75.0000 mg | ORAL_TABLET | Freq: Every day | ORAL | Status: DC
  Administered 2017-02-15 – 2017-02-16 (×2): 75 mg via ORAL
  Filled 2017-02-15 (×2): qty 1

## 2017-02-15 MED ORDER — SENNOSIDES-DOCUSATE SODIUM 8.6-50 MG PO TABS: 1.0000 | ORAL_TABLET | Freq: Every evening | ORAL | Status: DC | PRN

## 2017-02-15 MED ORDER — ACETAMINOPHEN 160 MG/5ML PO SOLN: 650.0000 mg | ORAL | Status: DC | PRN

## 2017-02-15 MED ORDER — NITROGLYCERIN 0.4 MG SL SUBL: 0.4000 mg | SUBLINGUAL_TABLET | SUBLINGUAL | Status: DC | PRN

## 2017-02-15 NOTE — ED Notes (Signed)
Pt remains alert and oriented waiting on bed. NAD. No needs at this time. Will continue to monitor.

## 2017-02-15 NOTE — ED Triage Notes (Signed)
Pt c/o that when woke up today feels like left leg buckles when tries to walk. No pain while in bed. Does not feel numbness or tingling but reports if tried to walk her leg will buckle. Does have 2 stents to left leg r/t PVD

## 2017-02-15 NOTE — ED Notes (Signed)
Called and updated son (dr Nicholes Mango lloyed [phone]) per pts request.

## 2017-02-15 NOTE — ED Provider Notes (Signed)
[facility] Emergency Department Provider Note  ____________________________________________  Time seen: Approximately 12:17 PM  I have reviewed the triage vital signs and the nursing notes.   HISTORY  Chief Complaint leg weakness   HPI Jill Hudson is a 82 y.o. female with a history of CAD, diabetes, hypertension, hyperlipidemia, peripheral vascular disease with 2 stents on her left lower extremity on Plavix who presents for evaluation of weakness of her left lower extremity. Patient reports that she woke up this morning at 9:30 when she tried to get up from the bed her left leg gave out. She gave herself a few more minutes and tried again and he was unable to walk. She almost sustained a fall due to the weakness of herLLE. She reports that the leg feels very heavy compared to the other one. She has had similar symptoms in her left upper extremity although less pronounced also since this morning. Last seen normal was yesterday before bedtime. Patient denies any prior history of stroke. No slurred speech, no difficulty finding words, no facial droop. She denies pain in her back, her leg, her arm, neck, chest, abdomen..   Past Medical History:  Diagnosis Date  . Anxiety   . Arthritis   . Diabetes mellitus without complication ([place])   . Hyperlipidemia   . Hypertension   . Mitral and aortic valve disease   . Occasional tremors   . Shingles   . UTI (urinary tract infection)   . Vascular disease, peripheral [facility])     Patient Active Problem List   Diagnosis Date Noted  . CVA (cerebral vascular accident) ([place]) 02/15/2017  . Degenerative disc disease, lumbar 12/21/2016  . Atherosclerosis of native arteries of extremity with intermittent claudication ([place]) 12/20/2016  . Bilateral carotid artery stenosis 12/20/2016  . Hip bursitis 05/18/2016  . Elevated TSH 01/18/2016  . Chronic renal insufficiency, stage III (moderate) 01/16/2016  . Senile ecchymosis  01/16/2016  . Uncontrolled type 2 diabetes mellitus with diabetic polyneuropathy, with long-term current use of insulin ([place]) 09/16/2015  . GERD (gastroesophageal reflux disease) 07/24/2015  . Peripheral blood vessel disorder ([place]) 05/17/2015  . Neoplasm of uncertain behavior of skin 05/17/2015  . Background retinopathy due to secondary diabetes ([place]) 04/11/2015  . Diabetes mellitus with polyneuropathy ([place]) 04/11/2015  . HLD (hyperlipidemia) 04/11/2015  . Essential (primary) hypertension 04/11/2015  . Generalized OA 04/11/2015  . Arterial insufficiency ([place]) 04/11/2015  . Arteriosclerosis of coronary artery 05/29/2013  . Hypertensive heart disease without CHF 05/29/2013    Past Surgical History:  Procedure Laterality Date  . CARDIAC CATHETERIZATION  1998   40% LM, 95% Ramus interm  . PTCA  08/2013   Left common iliac  . PTCA  12/2012   left ext iliac    Prior to Admission medications   Medication Sig Start Date End Date Taking? Authorizing Provider  Acetaminophen (TYLENOL PO) Take by mouth. Takes one 650 mg tablet + one 500 ng tablet at bedtime   Yes Historical Provider, MD  aspirin 81 MG tablet Take 1 tablet by mouth daily.   Yes Historical Provider, MD  B Complex Vitamins (VITAMIN-B COMPLEX) TABS Take 1 tablet by mouth daily.    Yes Historical Provider, MD  Biotin 10 MG TABS Take 1 tablet by mouth daily.    Yes Historical Provider, MD  clopidogrel (PLAVIX) 75 MG tablet Take 1 tablet by mouth daily.   Yes Historical Provider, MD  Cyanocobalamin (RA VITAMIN B-12 TR) 1000 MCG TBCR Take 1  tablet by mouth daily.    Yes Historical Provider, MD  diltiazem (CARTIA XT) 180 MG 24 hr capsule Take 1 tablet by mouth 2 (two) times daily.   Yes Historical Provider, MD  Insulin Glargine (LANTUS SOLOSTAR) 100 UNIT/ML Solostar Pen Inject 8 Units into the skin 2 (two) times daily.    Yes Historical Provider, MD  losartan (COZAAR) 50 MG tablet Take 50 mg by mouth daily. 11/27/16 11/27/17 Yes Historical  Provider, MD  metFORMIN (GLUCOPHAGE-XR) 500 MG 24 hr tablet TAKE 1 TABLET BY MOUTH TWICE DAILY 12/25/16  Yes [name], MD  Multiple Vitamin (MULTI-VITAMINS) TABS Take 1 tablet by mouth daily.    Yes Historical Provider, MD  Multiple Vitamins-Minerals (PRESERVISION AREDS PO) Take 1 tablet by mouth 2 (two) times daily.   Yes Historical Provider, MD  nitroGLYCERIN (NITROSTAT) 0.4 MG SL tablet PLACE 1 TABLET UNDER THE TONGUE FOR CHEST PAIN, MAY REPEAT IN 5 MINUTES FOR UP TO 3 DOSES 11/18/16  Yes [name], MD  nystatin-triamcinolone [facility] II) cream Apply 1 application topically 2 (two) times daily. 12/21/16  Yes [name], MD  Omega 3 1000 MG CAPS Take 1 capsule by mouth daily.  12/12/09  Yes Historical Provider, MD  pravastatin (PRAVACHOL) 20 MG tablet Take 20 mg by mouth every evening.  02/13/16  Yes Historical Provider, MD  traMADol (ULTRAM) 50 MG tablet Take 50 mg by mouth every 6 (six) hours as needed.  09/16/16  Yes Historical Provider, MD  B-D ULTRAFINE III SHORT PEN 31G X 8 MM MISC USE AS DIRECTED( APPLY BY DOSE NOT DAILY) 02/09/17   [name], MD  gabapentin (NEURONTIN) 100 MG capsule Take 100 mg by mouth at bedtime as needed.  09/16/16 11/15/16  Historical Provider, MD    Allergies Saxagliptin; Codeine; Epinephrine; Ezetimibe; Nitrofurantoin; Limonene; and Sulfa antibiotics  Family History  Problem Relation Age of Onset  . Dementia Mother   . Diabetes Father     Social History Social History  Substance Use Topics  . Smoking status: Former Research scientist (life sciences)  . Smokeless tobacco: Never Used  . Alcohol use No    Review of Systems  Constitutional: Negative for fever. Eyes: Negative for visual changes. ENT: Negative for sore throat. Neck: No neck pain  Cardiovascular: Negative for chest pain. Respiratory: Negative for shortness of breath. Gastrointestinal: Negative for abdominal pain, vomiting or diarrhea. Genitourinary: Negative for dysuria. Musculoskeletal:  Negative for back pain. Skin: Negative for rash. Neurological: Negative for headaches. + weakness of LU and LLE Psych: No SI or HI  ____________________________________________   PHYSICAL EXAM:  VITAL SIGNS: ED Triage Vitals  Enc Vitals Group     BP 02/15/17 1136 (!) 176/64     Pulse Rate 02/15/17 1136 62     Resp 02/15/17 1136 16     Temp 02/15/17 1136 97.8 F (36.6 C)     Temp Source 02/15/17 1136 Oral     SpO2 02/15/17 1136 100 %     Weight 02/15/17 1127 116 lb (52.6 kg)     Height 02/15/17 1127 5\' 1"  (1.549 m)     Head Circumference --      Peak Flow --      Pain Score --      Pain Loc --      Pain Edu? --      Excl. in [place]? --     Constitutional: Alert and oriented. Well appearing and in no apparent distress. HEENT:  Head: Normocephalic and atraumatic.         Eyes: Conjunctivae are normal. Sclera is non-icteric. EOMI. PERRL      Mouth/Throat: Mucous membranes are moist.       Neck: Supple with no signs of meningismus. Cardiovascular: Regular rate and rhythm. No murmurs, gallops, or rubs. 2+ DP and PT on the RLE and 1+ on the LLE Respiratory: Normal respiratory effort. Lungs are clear to auscultation bilaterally. No wheezes, crackles, or rhonchi.  Gastrointestinal: Soft, non tender, and non distended with positive bowel sounds. No rebound or guarding. Genitourinary: No CVA tenderness. Musculoskeletal: Nontender with normal range of motion in all extremities. No edema, cyanosis, or erythema of extremities. LLE is warm and well perfused. Neurologic: Normal speech and language. A & O x3, PERRL, no nystagmus, CN II-XII intact, motor testing reveals good tone and bulk throughout. Drift of the LLE noticed however patient able to hold leg up for 5 s, There is no evidence of pronator drift or dysmetria. Muscle strength is 3/5 on the LLE, normal on all extremities. Deep tendon reflexes are 2+ throughout with downgoing toes. Sensory examination is intact. Gait deferred. Skin:  Skin is warm, dry and intact. No rash noted. Psychiatric: Mood and affect are normal. Speech and behavior are normal.  NIH Stroke Scale  Interval: Baseline Time: 3:36 PM Person Administering Scale: [place] stroke scale items in the order listed. Record performance in each category after each subscale exam. Do not go back and change scores. Follow directions provided for each exam technique. Scores should reflect what the patient does, not what the clinician thinks the patient can do. The clinician should record answers while administering the exam and work quickly. Except where indicated, the patient should not be coached (i.e., repeated requests to patient to make a special effort).   1a  Level of consciousness: 0=alert; keenly responsive  1b. LOC questions:  0=Performs both tasks correctly  1c. LOC commands: 0=Performs both tasks correctly  2.  Best Gaze: 0=normal  3.  Visual: 0=No visual loss  4. Facial Palsy: 0=Normal symmetric movement  5a.  Motor left arm: 0=No drift, limb holds 90 (or 45) degrees for full 10 seconds  5b.  Motor right arm: 0=No drift, limb holds 90 (or 45) degrees for full 10 seconds  6a. motor left leg: 1=Drift, limb holds 90 (or 45) degrees but drifts down before full 10 seconds: does not hit bed  6b  Motor right leg:  0=No drift, limb holds 90 (or 45) degrees for full 10 seconds  7. Limb Ataxia: 1=Present in one limb  8.  Sensory: 0=Normal; no sensory loss  9. Best Language:  0=No aphasia, normal  10. Dysarthria: 0=Normal  11. Extinction and Inattention: 0=No abnormality   Total:   2    ____________________________________________   LABS (all labs ordered are listed, but only abnormal results are displayed)  Labs Reviewed  BASIC METABOLIC PANEL - Abnormal; Notable for the following:       Result Value   Glucose, Bld 163 (*)    All other components within normal limits  CBC - Abnormal; Notable for the following:    Hemoglobin  11.6 (*)    HCT 34.7 (*)    All other components within normal limits  PROTIME-INR  APTT  URINALYSIS, ROUTINE W REFLEX MICROSCOPIC   ____________________________________________  EKG  ED ECG REPORT I, Rudene Re, the attending physician, personally viewed and interpreted this ECG.  Normal sinus rhythm, rate of  61, normal intervals, normal axis, no ST elevations or depressions. ____________________________________________  RADIOLOGY  Head CT: Mild diffuse cortical atrophy. Mild chronic ischemic white matter disease. No acute intracranial abnormality seen. ____________________________________________   PROCEDURES  Procedure(s) performed: None Procedures Critical Care performed:  None ____________________________________________   INITIAL IMPRESSION / ASSESSMENT AND PLAN / ED COURSE  82 y.o. female with a history of CAD, diabetes, hypertension, hyperlipidemia, peripheral vascular disease with 2 stents on her left lower extremity on Plavix who presents for evaluation of weakness of her left lower and upper extremity since waking up at 9:30 AM this morning. Patient last seen normal yesterday evening. No prior history of stroke. NIH stroke scale of 2 upon arrival. I'm concerned the patient has had a stroke.  she is on Plavix. Labs and head CT have been ordered. Patient outside the window for TPA.   Clinical Course as of Feb 16 1535  Mon Feb 15, 2017  1309 CT head negative for hemorrhagic stroke. Patient given a full aspirin. I discussed with Dr. Irish Elders, Neurology on call who agrees with admission to Hospitalist for further workup.   [CV]    Clinical Course User Index [CV] Rudene Re, MD    Pertinent labs & imaging results that were available during my care of the patient were reviewed by me and considered in my medical decision making (see chart for details).    ____________________________________________   FINAL CLINICAL IMPRESSION(S) / ED  DIAGNOSES  Final diagnoses:  Cerebrovascular accident (CVA), unspecified mechanism ([facility])  CVA (cerebral vascular accident) ([place])  CVA (cerebral vascular accident) ([place])  CVA (cerebral vascular accident) ([place])      NEW MEDICATIONS STARTED DURING THIS VISIT:  Current Discharge Medication List       Note:  This document was prepared using Dragon voice recognition software and may include unintentional dictation errors.    Rudene Re, MD 02/15/17 1536

## 2017-02-15 NOTE — Plan of Care (Signed)
Problem: Physical Regulation: Goal: Ability to maintain clinical measurements within normal limits will improve Outcome: Progressing Pt admitted from the ED. NIH 0. L leg feels heavy per pt. Denies pain. VSS.

## 2017-02-15 NOTE — ED Notes (Signed)
Pt also informed doctor veronese that her left arm has felt heavy.

## 2017-02-15 NOTE — H&P (Signed)
[facility] NAME: Jill Hudson    MR#:  [id number]  DATE OF BIRTH:  Aug 18, 1935  DATE OF ADMISSION:  02/15/2017  PRIMARY CARE PHYSICIAN: [name], MD   REQUESTING/REFERRING PHYSICIAN: Gonzella Lex md  CHIEF COMPLAINT:   Chief Complaint  Patient presents with  . leg weakness    HISTORY OF PRESENT ILLNESS: Jill Hudson  is a 82 y.o. female with a known history of  DM2, HTN, hyprelipdemia, carotid Artery disease being followed by vascular surgery who developed acute onset of left lower extremity weakness as well as left hand weakness. Which started around 9:30 this morning. His symptoms persisted. Patient denies any chest pains or shortness of breath.  PAST MEDICAL HISTORY:   Past Medical History:  Diagnosis Date  . Anxiety   . Arthritis   . Diabetes mellitus without complication ([place])   . Hyperlipidemia   . Hypertension   . Mitral and aortic valve disease   . Occasional tremors   . Shingles   . UTI (urinary tract infection)   . Vascular disease, peripheral ([place])     PAST SURGICAL HISTORY: Past Surgical History:  Procedure Laterality Date  . CARDIAC CATHETERIZATION  1998   40% LM, 95% Ramus interm  . PTCA  08/2013   Left common iliac  . PTCA  12/2012   left ext iliac    SOCIAL HISTORY:  Social History  Substance Use Topics  . Smoking status: Former Research scientist (life sciences)  . Smokeless tobacco: Never Used  . Alcohol use No    FAMILY HISTORY:  Family History  Problem Relation Age of Onset  . Dementia Mother   . Diabetes Father     DRUG ALLERGIES:  Allergies  Allergen Reactions  . Saxagliptin Diarrhea  . Codeine     Upset stomach  . Epinephrine   . Ezetimibe Other (See Comments)    myalgias  . Nitrofurantoin     Pruitus, rash  . Limonene Rash  . Sulfa Antibiotics Rash and Other (See Comments)    Sore mouth     REVIEW OF SYSTEMS:   CONSTITUTIONAL: No fever, fatigue or weakness.  EYES: No blurred or double  vision.  EARS, NOSE, AND THROAT: No tinnitus or ear pain.  RESPIRATORY: No cough, shortness of breath, wheezing or hemoptysis.  CARDIOVASCULAR: No chest pain, orthopnea, edema.  GASTROINTESTINAL: No nausea, vomiting, diarrhea or abdominal pain.  GENITOURINARY: No dysuria, hematuria.  ENDOCRINE: No polyuria, nocturia,  HEMATOLOGY: No anemia, easy bruising or bleeding SKIN: No rash or lesion. MUSCULOSKELETAL: No joint pain or arthritis.   NEUROLOGIC: No tingling, numbness, weakness. Left leg weakness PSYCHIATRY: No anxiety or depression.   MEDICATIONS AT HOME:  Prior to Admission medications   Medication Sig Start Date End Date Taking? Authorizing Provider  Acetaminophen (TYLENOL PO) Take by mouth. Takes one 650 mg tablet + one 500 ng tablet at bedtime   Yes Historical Provider, MD  aspirin 81 MG tablet Take 1 tablet by mouth daily.   Yes Historical Provider, MD  B Complex Vitamins (VITAMIN-B COMPLEX) TABS Take 1 tablet by mouth daily.    Yes Historical Provider, MD  Biotin 10 MG TABS Take 1 tablet by mouth daily.    Yes Historical Provider, MD  clopidogrel (PLAVIX) 75 MG tablet Take 1 tablet by mouth daily.   Yes Historical Provider, MD  Cyanocobalamin (RA VITAMIN B-12 TR) 1000 MCG TBCR Take 1 tablet by mouth daily.    Yes Historical Provider, MD  diltiazem (CARTIA XT) 180 MG 24 hr capsule Take 1 tablet by mouth 2 (two) times daily.   Yes Historical Provider, MD  Insulin Glargine (LANTUS SOLOSTAR) 100 UNIT/ML Solostar Pen Inject 8 Units into the skin 2 (two) times daily.    Yes Historical Provider, MD  losartan (COZAAR) 50 MG tablet Take 50 mg by mouth daily. 11/27/16 11/27/17 Yes Historical Provider, MD  metFORMIN (GLUCOPHAGE-XR) 500 MG 24 hr tablet TAKE 1 TABLET BY MOUTH TWICE DAILY 12/25/16  Yes [name], MD  Multiple Vitamin (MULTI-VITAMINS) TABS Take 1 tablet by mouth daily.    Yes Historical Provider, MD  Multiple Vitamins-Minerals (PRESERVISION AREDS PO) Take 1 tablet by mouth 2  (two) times daily.   Yes Historical Provider, MD  nitroGLYCERIN (NITROSTAT) 0.4 MG SL tablet PLACE 1 TABLET UNDER THE TONGUE FOR CHEST PAIN, MAY REPEAT IN 5 MINUTES FOR UP TO 3 DOSES 11/18/16  Yes [name], MD  nystatin-triamcinolone [facility] II) cream Apply 1 application topically 2 (two) times daily. 12/21/16  Yes [name], MD  Omega 3 1000 MG CAPS Take 1 capsule by mouth daily.  12/12/09  Yes Historical Provider, MD  pravastatin (PRAVACHOL) 20 MG tablet Take 20 mg by mouth every evening.  02/13/16  Yes Historical Provider, MD  traMADol (ULTRAM) 50 MG tablet Take 50 mg by mouth every 6 (six) hours as needed.  09/16/16  Yes Historical Provider, MD  B-D ULTRAFINE III SHORT PEN 31G X 8 MM MISC USE AS DIRECTED( APPLY BY DOSE NOT DAILY) 02/09/17   [name], MD  gabapentin (NEURONTIN) 100 MG capsule Take 100 mg by mouth at bedtime as needed.  09/16/16 11/15/16  Historical Provider, MD      PHYSICAL EXAMINATION:   VITAL SIGNS: Blood pressure (!) 176/64, pulse 62, temperature 97.8 F (36.6 C), temperature source Oral, resp. rate 16, height 5\' 1"  (1.549 m), weight 116 lb (52.6 kg), SpO2 100 %.  GENERAL:  82 y.o.-year-old patient lying in the bed with no acute distress.  EYES: Pupils equal, round, reactive to light and accommodation. No scleral icterus. Extraocular muscles intact.  HEENT: Head atraumatic, normocephalic. Oropharynx and nasopharynx clear.  NECK:  Supple, no jugular venous distention. No thyroid enlargement, no tenderness.  LUNGS: Normal breath sounds bilaterally, no wheezing, rales,rhonchi or crepitation. No use of accessory muscles of respiration.  CARDIOVASCULAR: S1, S2 normal. No murmurs, rubs, or gallops.  ABDOMEN: Soft, nontender, nondistended. Bowel sounds present. No organomegaly or mass.  EXTREMITIES: No pedal edema, cyanosis, or clubbing.  NEUROLOGIC: Cranial nerves II through XII are intact. Muscle strength 4/5 in Left lower extremity weakness. Sensation  intact. Gait not checked.  PSYCHIATRIC: The patient is alert and oriented x 3.  SKIN: No obvious rash, lesion, or ulcer.   LABORATORY PANEL:   CBC  Recent Labs Lab 02/15/17 1138  WBC 8.4  HGB 11.6*  HCT 34.7*  PLT 256  MCV 88.5  MCH 29.5  MCHC 33.3  RDW 13.6   ------------------------------------------------------------------------------------------------------------------  Chemistries   Recent Labs Lab 02/15/17 1138  NA 143  K 4.7  CL 110  CO2 23  GLUCOSE 163*  BUN 20  CREATININE 0.86  CALCIUM 9.4   ------------------------------------------------------------------------------------------------------------------ estimated creatinine clearance is 39.4 mL/min (by C-G formula based on SCr of 0.86 mg/dL). ------------------------------------------------------------------------------------------------------------------ No results for input(s): TSH, T4TOTAL, T3FREE, THYROIDAB in the last 72 hours.  Invalid input(s): FREET3   Coagulation profile No results for input(s): INR, PROTIME in the last 168 hours. -------------------------------------------------------------------------------------------------------------------  No results for input(s): DDIMER in the last 72 hours. -------------------------------------------------------------------------------------------------------------------  Cardiac Enzymes No results for input(s): CKMB, TROPONINI, MYOGLOBIN in the last 168 hours.  Invalid input(s): CK ------------------------------------------------------------------------------------------------------------------ Invalid input(s): POCBNP  ---------------------------------------------------------------------------------------------------------------  Urinalysis    Component Value Date/Time   COLORURINE YELLOW 02/06/2016 1904   APPEARANCEUR CLEAR 02/06/2016 1904   LABSPEC 1.015 02/06/2016 1904   PHURINE 5.5 02/06/2016 1904   GLUCOSEU NEGATIVE 02/06/2016 1904    HGBUR NEGATIVE 02/06/2016 1904   BILIRUBINUR neg 12/21/2016 1606   KETONESUR TRACE (A) 02/06/2016 1904   PROTEINUR neg 12/21/2016 1606   PROTEINUR NEGATIVE 02/06/2016 1904   UROBILINOGEN 0.2 12/21/2016 1606   NITRITE neg 12/21/2016 1606   NITRITE NEGATIVE 02/06/2016 1904   LEUKOCYTESUR large (3+) (A) 12/21/2016 1606     RADIOLOGY: Ct Head Wo Contrast  Result Date: 02/15/2017 CLINICAL DATA:  Left-sided weakness. EXAM: CT HEAD WITHOUT CONTRAST TECHNIQUE: Contiguous axial images were obtained from the base of the skull through the vertex without intravenous contrast. COMPARISON:  None. FINDINGS: Brain: Mild diffuse cortical atrophy is noted. Mild chronic ischemic white matter disease is noted. No mass effect or midline shift is noted. Ventricular size is within normal limits. There is no evidence of mass lesion, hemorrhage or acute infarction. Vascular: Atherosclerosis of carotid siphons is noted. Skull: Normal. Negative for fracture or focal lesion. Sinuses/Orbits: No acute finding. Other: None. IMPRESSION: Mild diffuse cortical atrophy. Mild chronic ischemic white matter disease. No acute intracranial abnormality seen. Electronically Signed   By: Marijo Conception, M.D.   On: 02/15/2017 12:38    EKG: Orders placed or performed during the hospital encounter of 02/15/17  . EKG 12-Lead  . EKG 12-Lead    IMPRESSION AND PLAN: Patient is a 82 year old presenting with left sided weakness  1. Acute CVA Neurology will be consulted Up pain MRI MRA of the brain Obtained an echocardiogram of the heart Continue aspirin and Plavix  2. Diabetes type 2 we'll continue her home regimen Place on sliding scale insulin  3. Hyperlipidemia unspecified continue Pravachol  4. Essential hypertension continue therapy with diltiazem and Cozaar  5. Miscellaneous we'll do Lovenox for DVT prophylaxis   All the records are reviewed and case discussed with ED provider. Management plans discussed with the  patient, family and they are in agreement.  CODE STATUS: Code Status History    This patient does not have a recorded code status. Please follow your organizational policy for patients in this situation.       TOTAL TIME TAKING CARE OF THIS PATIENT: 55 minutes.    Dustin Flock M.D on 02/15/2017 at 1:38 PM  Between 7am to 6pm - Pager - [phone]  After 6pm go to [email or web address] - password [email or web address] [facility]  Office  [phone]  CC: Primary care physician; [name], MD

## 2017-02-16 ENCOUNTER — Inpatient Hospital Stay: Payer: Medicare Other

## 2017-02-16 ENCOUNTER — Encounter: Payer: Self-pay | Admitting: Internal Medicine

## 2017-02-16 DIAGNOSIS — I639 Cerebral infarction, unspecified: Secondary | ICD-10-CM

## 2017-02-16 LAB — GLUCOSE, CAPILLARY
GLUCOSE-CAPILLARY: 106 mg/dL — AB (ref 65–99)
Glucose-Capillary: 299 mg/dL — ABNORMAL HIGH (ref 65–99)

## 2017-02-16 LAB — LIPID PANEL
Cholesterol: 155 mg/dL (ref 0–200)
HDL: 48 mg/dL (ref >40)
LDL CALC: 79 mg/dL (ref 0–99)
Total CHOL/HDL Ratio: 3.2 RATIO
Triglycerides: 139 mg/dL (ref <150)
VLDL: 28 mg/dL (ref 0–40)

## 2017-02-16 MED ORDER — GADOBENATE DIMEGLUMINE 529 MG/ML IV SOLN
10.0000 mL | Freq: Once | INTRAVENOUS | Status: AC | PRN
  Administered 2017-02-16: 12:00:00 10 mL via INTRAVENOUS

## 2017-02-16 MED ORDER — ATORVASTATIN CALCIUM 40 MG PO TABS: 40.0000 mg | ORAL_TABLET | Freq: Every day | ORAL | 1 refills | Status: DC

## 2017-02-16 MED ORDER — PRAVASTATIN SODIUM 40 MG PO TABS: 40.0000 mg | ORAL_TABLET | Freq: Every evening | ORAL | 1 refills | Status: DC

## 2017-02-16 NOTE — Progress Notes (Signed)
OT Cancellation Note  Patient Details Name: Jill Hudson MRN: [id number] DOB: 04-Oct-1935   Cancelled Treatment:    Reason Eval/Treat Not Completed: Patient at procedure or test/ unavailable. Order received, chart reviewed. Pt out of room for testing. Will re-attempt OT evaluation at later date/time as pt is available.  Jeni Salles, MPH, MS, OTR/L ascom [phone] 02/16/17, 11:36 AM

## 2017-02-16 NOTE — Evaluation (Signed)
Physical Therapy Evaluation Patient Details Name: Jill Hudson MRN: [id number] DOB: Nov 23, 1934 Today's Date: 02/16/2017   History of Present Illness  82 y.o. female with a known history of  DM2, HTN, hyprelipdemia, carotid Artery disease being followed by vascular surgery who developed acute onset of left lower extremity weakness as well as left hand weakness.  Clinical Impression  Pt did well with PT and was able to walk 250 ft with good confidence and only minimal cuing with and w/o AD, negotiate up/down steps.  She had functional strength b/l, but had some decreased control and confidence with L LE during ambulation especially with foot clearance (though she was functionally able to clear it she felt she needed to put in extra effort and be conscientious of it.)   Pt did well but is not at baseline, recommending HHPT.     Follow Up Recommendations Home health PT    Equipment Recommendations       Recommendations for Other Services       Precautions / Restrictions Precautions Precautions: Fall Restrictions Weight Bearing Restrictions: No      Mobility  Bed Mobility Overal bed mobility: Modified Independent             General bed mobility comments: Pt needed bed rails, but did not need direct assist  Transfers Overall transfer level: Independent Equipment used: None             General transfer comment: Pt was able to rise and maintain balance with and w/o AD, no safety issues  Ambulation/Gait Ambulation/Gait assistance: Supervision Ambulation Distance (Feet): 250 Feet Assistive device: Rolling walker (2 wheeled);None       General Gait Details: Pt intially with some hesitancy, feeling of buckling on the L and some inconsistent foot control/clearance on the L but ultimately he had no LOBs, no signficant safety concerns and though she is not at her baseline she did well with AD (~100 ft) and w/o AD (~150 ft)  Stairs Stairs: Yes Stairs assistance:  Supervision Stair Management: One rail Left Number of Stairs: 5 General stair comments: Pt was able to negotiate up/down steps without assist, she did have heavy reliance on the rail and favored L with sideways step-to strategy but did not have any safety concerns.  Wheelchair Mobility    Modified Rankin (Stroke Patients Only)       Balance Overall balance assessment: Independent                                           Pertinent Vitals/Pain Pain Assessment: No/denies pain    Home Living Family/patient expects to be discharged to:: Private residence Living Arrangements: Spouse/significant other Available Help at Discharge: Family   Home Access: Stairs to enter Entrance Stairs-Rails: Can reach both Entrance Stairs-Number of Steps: 4   Home Equipment: Environmental consultant - 2 wheels      Prior Function Level of Independence: Independent         Comments: Pt reports that she is regularly outo fo the house, runs errands, stays active     Hand Dominance        Extremity/Trunk Assessment   Upper Extremity Assessment Upper Extremity Assessment: Defer to OT evaluation    Lower Extremity Assessment Lower Extremity Assessment: Overall WFL for tasks assessed (appeared to have = strength b/l with testing)       Communication   Communication:  No difficulties  Cognition Arousal/Alertness: Awake/alert Behavior During Therapy: WFL for tasks assessed/performed Overall Cognitive Status: Within Functional Limits for tasks assessed                                        General Comments      Exercises     Assessment/Plan    PT Assessment Patient needs continued PT services  PT Problem List Decreased coordination;Decreased mobility;Decreased balance;Decreased activity tolerance;Decreased range of motion;Decreased strength;Decreased safety awareness       PT Treatment Interventions Gait training;Functional mobility training;Stair  training;Therapeutic exercise;Therapeutic activities;Balance training;Neuromuscular re-education;Patient/family education    PT Goals (Current goals can be found in the Care Plan section)  Acute Rehab PT Goals Patient Stated Goal: go home PT Goal Formulation: With patient Time For Goal Achievement: 03/02/17 Potential to Achieve Goals: Good    Frequency 7X/week   Barriers to discharge        Co-evaluation               End of Session Equipment Utilized During Treatment: Gait belt Activity Tolerance: Patient tolerated treatment well Patient left: with call bell/phone within reach;with chair alarm set   PT Visit Diagnosis: Muscle weakness (generalized) (M62.81);Difficulty in walking, not elsewhere classified (R26.2)    Time: 3419-3790 PT Time Calculation (min) (ACUTE ONLY): 23 min   Charges:   PT Evaluation $PT Eval Low Complexity: 1 Procedure     PT G Codes:        Kreg Shropshire, DPT 02/16/2017, 1:38 PM

## 2017-02-16 NOTE — Consult Note (Signed)
Reason for Consult: L sided weakness  Referring Physician: Dr. Verdell Carmine   CC: L sided weakness   HPI: Jill Hudson is an 82 y.o. female with a known history of  DM2, HTN, hyprelipdemia, carotid Artery disease being followed by vascular surgery who developed acute onset of left lower extremity weakness as well as left hand weakness. Which started around 9:30 AM yesterd. Now symptoms improved and pt is close to baseline.    Past Medical History:  Diagnosis Date  . Anxiety   . Arthritis   . Diabetes mellitus without complication ([place])   . Hyperlipidemia   . Hypertension   . Mitral and aortic valve disease   . Occasional tremors   . Shingles   . UTI (urinary tract infection)   . Vascular disease, peripheral [facility])     Past Surgical History:  Procedure Laterality Date  . CARDIAC CATHETERIZATION  1998   40% LM, 95% Ramus interm  . PTCA  08/2013   Left common iliac  . PTCA  12/2012   left ext iliac    Family History  Problem Relation Age of Onset  . Dementia Mother   . Diabetes Father     Social History:  reports that she has quit smoking. She has never used smokeless tobacco. She reports that she does not drink alcohol or use drugs.  Allergies  Allergen Reactions  . Saxagliptin Diarrhea  . Codeine     Upset stomach  . Epinephrine   . Ezetimibe Other (See Comments)    myalgias  . Nitrofurantoin     Pruitus, rash  . Limonene Rash  . Sulfa Antibiotics Rash and Other (See Comments)    Sore mouth     Medications: I have reviewed the patient's current medications.  ROS: History obtained from chart review and the patient  General ROS: negative for - chills, fatigue, fever, night sweats, weight gain or weight loss Psychological ROS: negative for - behavioral disorder, hallucinations, memory difficulties, mood swings or suicidal ideation Ophthalmic ROS: negative for - blurry vision, double vision, eye pain or loss of vision ENT ROS: negative for - epistaxis, nasal  discharge, oral lesions, sore throat, tinnitus or vertigo Allergy and Immunology ROS: negative for - hives or itchy/watery eyes Hematological and Lymphatic ROS: negative for - bleeding problems, bruising or swollen lymph nodes Endocrine ROS: negative for - galactorrhea, hair pattern changes, polydipsia/polyuria or temperature intolerance Respiratory ROS: negative for - cough, hemoptysis, shortness of breath or wheezing Cardiovascular ROS: negative for - chest pain, dyspnea on exertion, edema or irregular heartbeat Gastrointestinal ROS: negative for - abdominal pain, diarrhea, hematemesis, nausea/vomiting or stool incontinence Genito-Urinary ROS: negative for - dysuria, hematuria, incontinence or urinary frequency/urgency Musculoskeletal ROS: negative for - joint swelling or muscular weakness Neurological ROS: as noted in HPI Dermatological ROS: negative for rash and skin lesion changes  Physical Examination: Blood pressure (!) 140/106, pulse 77, temperature 98.1 F (36.7 C), temperature source Oral, resp. rate 18, height 5' 1.5" (1.562 m), weight 53.3 kg (117 lb 9.6 oz), SpO2 100 %.   Neurological Examination   Mental Status: Alert, oriented, thought content appropriate.  Speech fluent without evidence of aphasia.  Able to follow 3 step commands without difficulty. Cranial Nerves: II: Discs flat bilaterally; Visual fields grossly normal, pupils equal, round, reactive to light and accommodation III,IV, VI: ptosis not present, extra-ocular motions intact bilaterally V,VII: smile symmetric, facial light touch sensation normal bilaterally VIII: hearing normal bilaterally IX,X: gag reflex present XI: bilateral shoulder shrug  XII: midline tongue extension Motor: Right : Upper extremity   5/5    Left:     Upper extremity   5/5  Lower extremity   5/5     Lower extremity   5/5 Tone and bulk:normal tone throughout; no atrophy noted Sensory: Pinprick and light touch intact throughout,  bilaterally Deep Tendon Reflexes: 2+ and symmetric throughout Plantars: Right: downgoing   Left: downgoing Cerebellar: normal finger-to-nose, normal rapid alternating movements and normal heel-to-shin test Gait: not tested       Laboratory Studies:   Basic Metabolic Panel:  Recent Labs Lab 02/15/17 1138  NA 143  K 4.7  CL 110  CO2 23  GLUCOSE 163*  BUN 20  CREATININE 0.86  CALCIUM 9.4    Liver Function Tests: No results for input(s): AST, ALT, ALKPHOS, BILITOT, PROT, ALBUMIN in the last 168 hours. No results for input(s): LIPASE, AMYLASE in the last 168 hours. No results for input(s): AMMONIA in the last 168 hours.  CBC:  Recent Labs Lab 02/15/17 1138  WBC 8.4  HGB 11.6*  HCT 34.7*  MCV 88.5  PLT 256    Cardiac Enzymes: No results for input(s): CKTOTAL, CKMB, CKMBINDEX, TROPONINI in the last 168 hours.  BNP: Invalid input(s): POCBNP  CBG:  Recent Labs Lab 02/15/17 1706 02/15/17 2122 02/16/17 0754 02/16/17 1218  GLUCAP 85 172* 106* 299*    Microbiology: Results for orders placed or performed in visit on 12/21/16  Urine culture     Status: None   Collection Time: 12/21/16  4:00 PM  Result Value Ref Range Status   Urine Culture, Routine Final report  Final   Urine Culture result 1 Comment  Final    Comment: Mixed urogenital flora Less than 10,000 colonies/mL     Coagulation Studies:  Recent Labs  02/15/17 1138  LABPROT 13.8  INR 1.06    Urinalysis:  Recent Labs Lab 02/15/17 1558  COLORURINE STRAW*  LABSPEC 1.006  PHURINE 7.0  GLUCOSEU NEGATIVE  HGBUR NEGATIVE  BILIRUBINUR NEGATIVE  KETONESUR NEGATIVE  PROTEINUR 30*  NITRITE NEGATIVE  LEUKOCYTESUR TRACE*    Lipid Panel:     Component Value Date/Time   CHOL 155 02/16/2017 0713   CHOL 181 01/16/2016 1135   TRIG 139 02/16/2017 0713   HDL 48 02/16/2017 0713   HDL 63 01/16/2016 1135   CHOLHDL 3.2 02/16/2017 0713   VLDL 28 02/16/2017 0713   LDLCALC 79 02/16/2017 0713    LDLCALC 95 01/16/2016 1135    HgbA1C:  Lab Results  Component Value Date   HGBA1C 9.3 (H) 09/23/2016    Urine Drug Screen:  No results found for: LABOPIA, COCAINSCRNUR, LABBENZ, AMPHETMU, THCU, LABBARB  Alcohol Level: No results for input(s): ETH in the last 168 hours.  Other results: EKG: normal EKG, normal sinus rhythm, unchanged from previous tracings.  Imaging: Ct Head Wo Contrast  Result Date: 02/15/2017 CLINICAL DATA:  Left-sided weakness. EXAM: CT HEAD WITHOUT CONTRAST TECHNIQUE: Contiguous axial images were obtained from the base of the skull through the vertex without intravenous contrast. COMPARISON:  None. FINDINGS: Brain: Mild diffuse cortical atrophy is noted. Mild chronic ischemic white matter disease is noted. No mass effect or midline shift is noted. Ventricular size is within normal limits. There is no evidence of mass lesion, hemorrhage or acute infarction. Vascular: Atherosclerosis of carotid siphons is noted. Skull: Normal. Negative for fracture or focal lesion. Sinuses/Orbits: No acute finding. Other: None. IMPRESSION: Mild diffuse cortical atrophy. Mild chronic ischemic white matter  disease. No acute intracranial abnormality seen. Electronically Signed   By: Marijo Conception, M.D.   On: 02/15/2017 12:38     Assessment/Plan:  82 y.o. female with a known history of  DM2, HTN, hyprelipdemia, carotid Artery disease being followed by vascular surgery who developed acute onset of left lower extremity weakness as well as left hand weakness. Which started around 9:30 AM yesterday.  Now symptoms improved and pt is close to baseline.    Pt was on ASA 81 and Plavix 75 at home please continue.    MRI reviewed and prelim I see small R subcortical infarct in the R thalamic region Needs Pt/ot Stroke likely in setting of small vessel disease involving small perforators from the PCA D/c planning Lovenia Debruler    02/16/2017, 12:38 PM

## 2017-02-16 NOTE — Progress Notes (Signed)
Pt d/ced home.  MRI was positive for small acute CVA which has caused some weakness on Left side with no sensation issues. Marland Kitchen  NIHSS = 2.  She has no visual or speech deficits.  PT recommended home health PT and that has been set up.  Statin has been increased to 40 mg.  She is already taking 81 mg aspirin. D/c instructions reviewed with patient.  Provided her with copies of MRI and CT for her son who is a neurologist.  IV removed by Nurse Tech and she will go home with family member.

## 2017-02-16 NOTE — Discharge Summary (Signed)
[facility] [place] NAME: Jill Hudson    MR#:  [id number]  DATE OF BIRTH:  [birth year]  DATE OF ADMISSION:  02/15/2017 ADMITTING PHYSICIAN: Dustin Flock, MD  DATE OF DISCHARGE: 02/16/2017  PRIMARY CARE PHYSICIAN: [name], MD    ADMISSION DIAGNOSIS:  CVA (cerebral vascular accident) [facility]) [I63.9] Cerebrovascular accident (CVA), unspecified mechanism ([place]) [I63.9]  DISCHARGE DIAGNOSIS:  Active Problems:   CVA (cerebral vascular accident) ([place])   SECONDARY DIAGNOSIS:   Past Medical History:  Diagnosis Date  . Anxiety   . Arthritis   . Diabetes mellitus without complication ([place])   . Hyperlipidemia   . Hypertension   . Mitral and aortic valve disease   . Occasional tremors   . Shingles   . UTI (urinary tract infection)   . Vascular disease, peripheral [facility])     HOSPITAL COURSE:   82 year old female with past medical history of hypertension, hyperlipidemia, diabetes, peripheral vascular disease, who presented to the hospital due to left foot spasms and also left hand weakness.  1. Acute CVA-this is the cause of patient's left hand weakness. Patient's neurologic symptoms and now back to baseline. -Patient was admitted underwent a CT scan of the head which was negative for acute pathology, and underwent an MRI of the brain which showed a positive subcortical infarct in the right thalamus region which was small. -Patient was seen by neurology recommended continuing current antiplatelet therapy with aspirin and Plavix. They did not recommend making any new changes. She was seen by physical therapy to recommended home health services which was arranged for her prior to discharge. - She was changed to High dose intensity statin to Atorvastatin upon discharge.   2. DM type II w/out complication - pt. Will resume Lantus, Metformin.   3. DM Neuropathy - cont. Neurontin  4.  HTN - cont. Cardizem, Losartan.   DISCHARGE CONDITIONS:    Stable.   CONSULTS OBTAINED:  Treatment Team:  Leotis Pain, MD  DRUG ALLERGIES:   Allergies  Allergen Reactions  . Saxagliptin Diarrhea  . Codeine     Upset stomach  . Epinephrine   . Ezetimibe Other (See Comments)    myalgias  . Nitrofurantoin     Pruitus, rash  . Limonene Rash  . Sulfa Antibiotics Rash and Other (See Comments)    Sore mouth     DISCHARGE MEDICATIONS:   Allergies as of 02/16/2017      Reactions   Saxagliptin Diarrhea   Codeine    Upset stomach   Epinephrine    Ezetimibe Other (See Comments)   myalgias   Nitrofurantoin    Pruitus, rash   Limonene Rash   Sulfa Antibiotics Rash, Other (See Comments)   Sore mouth       Medication List    STOP taking these medications   pravastatin 20 MG tablet Commonly known as:  PRAVACHOL     TAKE these medications   aspirin 81 MG tablet Take 1 tablet by mouth daily.   atorvastatin 40 MG tablet Commonly known as:  LIPITOR Take 1 tablet (40 mg total) by mouth daily.   B-D ULTRAFINE III SHORT PEN 31G X 8 MM Misc Generic drug:  Insulin Pen Needle USE AS DIRECTED( APPLY BY DOSE NOT DAILY)   Biotin 10 MG Tabs Take 1 tablet by mouth daily.   CARTIA XT 180 MG 24 hr capsule Generic drug:  diltiazem Take 1 tablet by mouth 2 (two) times daily.  gabapentin 100 MG capsule Commonly known as:  NEURONTIN Take 100 mg by mouth at bedtime as needed.   LANTUS SOLOSTAR 100 UNIT/ML Solostar Pen Generic drug:  Insulin Glargine Inject 8 Units into the skin 2 (two) times daily.   losartan 50 MG tablet Commonly known as:  COZAAR Take 50 mg by mouth daily.   metFORMIN 500 MG 24 hr tablet Commonly known as:  GLUCOPHAGE-XR TAKE 1 TABLET BY MOUTH TWICE DAILY   MULTI-VITAMINS Tabs Take 1 tablet by mouth daily.   nitroGLYCERIN 0.4 MG SL tablet Commonly known as:  NITROSTAT PLACE 1 TABLET UNDER THE TONGUE FOR CHEST PAIN, MAY REPEAT IN 5 MINUTES FOR UP TO 3 DOSES   nystatin-triamcinolone cream Commonly  known as:  MYCOLOG II Apply 1 application topically 2 (two) times daily.   Omega 3 1000 MG Caps Take 1 capsule by mouth daily.   PLAVIX 75 MG tablet Generic drug:  clopidogrel Take 1 tablet by mouth daily.   PRESERVISION AREDS PO Take 1 tablet by mouth 2 (two) times daily.   RA VITAMIN B-12 TR 1000 MCG Tbcr Generic drug:  Cyanocobalamin Take 1 tablet by mouth daily.   traMADol 50 MG tablet Commonly known as:  ULTRAM Take 50 mg by mouth every 6 (six) hours as needed.   TYLENOL PO Take by mouth. Takes one 650 mg tablet + one 500 ng tablet at bedtime   Vitamin-B Complex Tabs Take 1 tablet by mouth daily.         DISCHARGE INSTRUCTIONS:   DIET:  Regular diet and Diabetic diet  DISCHARGE CONDITION:  Stable  ACTIVITY:  Activity as tolerated  OXYGEN:  Home Oxygen: No.   Oxygen Delivery: room air  DISCHARGE LOCATION:  Home with Home Health PT.     If you experience worsening of your admission symptoms, develop shortness of breath, life threatening emergency, suicidal or homicidal thoughts you must seek medical attention immediately by calling 911 or calling your MD immediately  if symptoms less severe.  You Must read complete instructions/literature along with all the possible adverse reactions/side effects for all the Medicines you take and that have been prescribed to you. Take any new Medicines after you have completely understood and accpet all the possible adverse reactions/side effects.   Please note  You were cared for by a hospitalist during your hospital stay. If you have any questions about your discharge medications or the care you received while you were in the hospital after you are discharged, you can call the unit and asked to speak with the hospitalist on call if the hospitalist that took care of you is not available. Once you are discharged, your primary care physician will handle any further medical issues. Please note that NO REFILLS for any  discharge medications will be authorized once you are discharged, as it is imperative that you return to your primary care physician (or establish a relationship with a primary care physician if you do not have one) for your aftercare needs so that they can reassess your need for medications and monitor your lab values.     Today   Back to baseline and left sided weakness improved and resolved now.  Headache resolved and no other complaints.   VITAL SIGNS:  Blood pressure (!) 139/42, pulse 74, temperature 97.8 F (36.6 C), temperature source Oral, resp. rate 18, height 5' 1.5" (1.562 m), weight 53.3 kg (117 lb 9.6 oz), SpO2 100 %.  I/O:    Intake/Output Summary (Last  24 hours) at 02/16/17 1441 Last data filed at 02/16/17 0900  Gross per 24 hour  Intake           1259.5 ml  Output             1850 ml  Net           -590.5 ml    PHYSICAL EXAMINATION:  GENERAL:  82 y.o.-year-old patient lying in the bed with no acute distress.  EYES: Pupils equal, round, reactive to light and accommodation. No scleral icterus. Extraocular muscles intact.  HEENT: Head atraumatic, normocephalic. Oropharynx and nasopharynx clear.  NECK:  Supple, no jugular venous distention. No thyroid enlargement, no tenderness.  LUNGS: Normal breath sounds bilaterally, no wheezing, rales,rhonchi. No use of accessory muscles of respiration.  CARDIOVASCULAR: S1, S2 normal. No murmurs, rubs, or gallops.  ABDOMEN: Soft, non-tender, non-distended. Bowel sounds present. No organomegaly or mass.  EXTREMITIES: No pedal edema, cyanosis, or clubbing.  NEUROLOGIC: Cranial nerves II through XII are intact. No focal motor or sensory defecits b/l.  PSYCHIATRIC: The patient is alert and oriented x 3. Good affect.  SKIN: No obvious rash, lesion, or ulcer.   DATA REVIEW:   CBC  Recent Labs Lab 02/15/17 1138  WBC 8.4  HGB 11.6*  HCT 34.7*  PLT 256    Chemistries   Recent Labs Lab 02/15/17 1138  NA 143  K 4.7  CL  110  CO2 23  GLUCOSE 163*  BUN 20  CREATININE 0.86  CALCIUM 9.4    Cardiac Enzymes No results for input(s): TROPONINI in the last 168 hours.  Microbiology Results  Results for orders placed or performed in visit on 12/21/16  Urine culture     Status: None   Collection Time: 12/21/16  4:00 PM  Result Value Ref Range Status   Urine Culture, Routine Final report  Final   Urine Culture result 1 Comment  Final    Comment: Mixed urogenital flora Less than 10,000 colonies/mL     RADIOLOGY:  Ct Head Wo Contrast  Result Date: 02/15/2017 CLINICAL DATA:  Left-sided weakness. EXAM: CT HEAD WITHOUT CONTRAST TECHNIQUE: Contiguous axial images were obtained from the base of the skull through the vertex without intravenous contrast. COMPARISON:  None. FINDINGS: Brain: Mild diffuse cortical atrophy is noted. Mild chronic ischemic white matter disease is noted. No mass effect or midline shift is noted. Ventricular size is within normal limits. There is no evidence of mass lesion, hemorrhage or acute infarction. Vascular: Atherosclerosis of carotid siphons is noted. Skull: Normal. Negative for fracture or focal lesion. Sinuses/Orbits: No acute finding. Other: None. IMPRESSION: Mild diffuse cortical atrophy. Mild chronic ischemic white matter disease. No acute intracranial abnormality seen. Electronically Signed   By: Marijo Conception, M.D.   On: 02/15/2017 12:38   Mr Jodene Nam Neck W Wo Contrast  Result Date: 02/16/2017 CLINICAL DATA:  Acute onset of lower extremity weakness. Left hand weakness. EXAM: MR HEAD WITHOUT CONTRAST MR CIRCLE OF WILLIS WITHOUT CONTRAST MRA OF THE NECK WITHOUT AND WITH CONTRAST TECHNIQUE: Multiplanar, multiecho pulse sequences of the brain, circle of willis and surrounding structures were obtained without intravenous contrast. Angiographic images of the neck were obtained using MRA technique without and with intravenous contrast. CONTRAST:  23mL MULTIHANCE GADOBENATE DIMEGLUMINE 529  MG/ML IV SOLN COMPARISON:  Head CT from yesterday FINDINGS: MR HEAD FINDINGS Brain: 1 cm area of restricted diffusion in the right thalamus extending into the internal capsule. No other acute infarct noted. No acute  hemorrhage, hydrocephalus, or mass. Chronic small-vessel disease with confluent ischemic gliosis in the deep cerebral white matter. Mild for age generalized cerebral volume loss. Vascular: Arterial findings below. Normal dural venous sinus flow voids. Skull and upper cervical spine: No marrow lesion noted. Sinuses/Orbits: Negative MR CIRCLE OF WILLIS FINDINGS Symmetric carotid arteries. Strong right vertebral artery dominance. Possible small anterior communicating artery, which would make complete circle-of-Willis. Poorly visualized flow in the left V4 segment, but likely due to direction of flow and small size given better opacification on postcontrast neck. No emergent large vessel occlusion. No reversible/proximal flow limiting stenosis. No beading or aneurysm suspected. There is a mild to moderate origin narrowing of the upper left PCA branch. MRA NECK FINDINGS Negative visualized proximal aorta. Common origin of the carotid arteriess with ~40% narrowing of the proximal left common carotid. There is atheromatous irregularity of the bilateral common carotid bifurcation and ICA bulbs without flow limiting stenosis. No beading or dissection suggested. Aberrant right subclavian artery. There is an irregular presumed plaque of the subclavian at and before the vertebral artery with 50-60% subclavian stenosis. Subjectively mild-to-moderate narrowing at the dominant right vertebral artery origin. The left vertebral artery is patent to the dura. No evidence of vertebral dissection. Documented antegrade flow in the carotid and vertebral arteries by time-of-flight. IMPRESSION: Brain MRI: 1. Acute lacunar infarct in the right thalamus and neighboring internal capsule. 2. Moderate to advanced chronic small vessel  ischemia in the cerebral white matter. Intracranial MRA: No acute finding or proximal flow limiting stenosis. Neck MRA: 1. Aberrant right subclavian artery. Irregular plaque near the level vertebral artery origin with 50-60% subclavian stenosis. Mild to moderate right vertebral origin stenosis. 2. Common origin of the common carotid arteries. ~40% proximal left common carotid stenosis. No flow limiting stenosis at the common carotid bifurcations. Electronically Signed   By: Monte Fantasia M.D.   On: 02/16/2017 12:47   Mr Brain Wo Contrast  Result Date: 02/16/2017 CLINICAL DATA:  Acute onset of lower extremity weakness. Left hand weakness. EXAM: MR HEAD WITHOUT CONTRAST MR CIRCLE OF WILLIS WITHOUT CONTRAST MRA OF THE NECK WITHOUT AND WITH CONTRAST TECHNIQUE: Multiplanar, multiecho pulse sequences of the brain, circle of willis and surrounding structures were obtained without intravenous contrast. Angiographic images of the neck were obtained using MRA technique without and with intravenous contrast. CONTRAST:  6mL MULTIHANCE GADOBENATE DIMEGLUMINE 529 MG/ML IV SOLN COMPARISON:  Head CT from yesterday FINDINGS: MR HEAD FINDINGS Brain: 1 cm area of restricted diffusion in the right thalamus extending into the internal capsule. No other acute infarct noted. No acute hemorrhage, hydrocephalus, or mass. Chronic small-vessel disease with confluent ischemic gliosis in the deep cerebral white matter. Mild for age generalized cerebral volume loss. Vascular: Arterial findings below. Normal dural venous sinus flow voids. Skull and upper cervical spine: No marrow lesion noted. Sinuses/Orbits: Negative MR CIRCLE OF WILLIS FINDINGS Symmetric carotid arteries. Strong right vertebral artery dominance. Possible small anterior communicating artery, which would make complete circle-of-Willis. Poorly visualized flow in the left V4 segment, but likely due to direction of flow and small size given better opacification on  postcontrast neck. No emergent large vessel occlusion. No reversible/proximal flow limiting stenosis. No beading or aneurysm suspected. There is a mild to moderate origin narrowing of the upper left PCA branch. MRA NECK FINDINGS Negative visualized proximal aorta. Common origin of the carotid arteriess with ~40% narrowing of the proximal left common carotid. There is atheromatous irregularity of the bilateral common carotid bifurcation and ICA bulbs  without flow limiting stenosis. No beading or dissection suggested. Aberrant right subclavian artery. There is an irregular presumed plaque of the subclavian at and before the vertebral artery with 50-60% subclavian stenosis. Subjectively mild-to-moderate narrowing at the dominant right vertebral artery origin. The left vertebral artery is patent to the dura. No evidence of vertebral dissection. Documented antegrade flow in the carotid and vertebral arteries by time-of-flight. IMPRESSION: Brain MRI: 1. Acute lacunar infarct in the right thalamus and neighboring internal capsule. 2. Moderate to advanced chronic small vessel ischemia in the cerebral white matter. Intracranial MRA: No acute finding or proximal flow limiting stenosis. Neck MRA: 1. Aberrant right subclavian artery. Irregular plaque near the level vertebral artery origin with 50-60% subclavian stenosis. Mild to moderate right vertebral origin stenosis. 2. Common origin of the common carotid arteries. ~40% proximal left common carotid stenosis. No flow limiting stenosis at the common carotid bifurcations. Electronically Signed   By: Monte Fantasia M.D.   On: 02/16/2017 12:47   Mr Jodene Nam Head/brain EN Cm  Result Date: 02/16/2017 CLINICAL DATA:  Acute onset of lower extremity weakness. Left hand weakness. EXAM: MR HEAD WITHOUT CONTRAST MR CIRCLE OF WILLIS WITHOUT CONTRAST MRA OF THE NECK WITHOUT AND WITH CONTRAST TECHNIQUE: Multiplanar, multiecho pulse sequences of the brain, circle of willis and surrounding  structures were obtained without intravenous contrast. Angiographic images of the neck were obtained using MRA technique without and with intravenous contrast. CONTRAST:  34mL MULTIHANCE GADOBENATE DIMEGLUMINE 529 MG/ML IV SOLN COMPARISON:  Head CT from yesterday FINDINGS: MR HEAD FINDINGS Brain: 1 cm area of restricted diffusion in the right thalamus extending into the internal capsule. No other acute infarct noted. No acute hemorrhage, hydrocephalus, or mass. Chronic small-vessel disease with confluent ischemic gliosis in the deep cerebral white matter. Mild for age generalized cerebral volume loss. Vascular: Arterial findings below. Normal dural venous sinus flow voids. Skull and upper cervical spine: No marrow lesion noted. Sinuses/Orbits: Negative MR CIRCLE OF WILLIS FINDINGS Symmetric carotid arteries. Strong right vertebral artery dominance. Possible small anterior communicating artery, which would make complete circle-of-Willis. Poorly visualized flow in the left V4 segment, but likely due to direction of flow and small size given better opacification on postcontrast neck. No emergent large vessel occlusion. No reversible/proximal flow limiting stenosis. No beading or aneurysm suspected. There is a mild to moderate origin narrowing of the upper left PCA branch. MRA NECK FINDINGS Negative visualized proximal aorta. Common origin of the carotid arteriess with ~40% narrowing of the proximal left common carotid. There is atheromatous irregularity of the bilateral common carotid bifurcation and ICA bulbs without flow limiting stenosis. No beading or dissection suggested. Aberrant right subclavian artery. There is an irregular presumed plaque of the subclavian at and before the vertebral artery with 50-60% subclavian stenosis. Subjectively mild-to-moderate narrowing at the dominant right vertebral artery origin. The left vertebral artery is patent to the dura. No evidence of vertebral dissection. Documented  antegrade flow in the carotid and vertebral arteries by time-of-flight. IMPRESSION: Brain MRI: 1. Acute lacunar infarct in the right thalamus and neighboring internal capsule. 2. Moderate to advanced chronic small vessel ischemia in the cerebral white matter. Intracranial MRA: No acute finding or proximal flow limiting stenosis. Neck MRA: 1. Aberrant right subclavian artery. Irregular plaque near the level vertebral artery origin with 50-60% subclavian stenosis. Mild to moderate right vertebral origin stenosis. 2. Common origin of the common carotid arteries. ~40% proximal left common carotid stenosis. No flow limiting stenosis at the common carotid bifurcations. Electronically  Signed   By: Monte Fantasia M.D.   On: 02/16/2017 12:47      Management plans discussed with the patient, family and they are in agreement.  CODE STATUS:     Code Status Orders        Start     Ordered   02/15/17 1539  Full code  Continuous     02/15/17 1538    Code Status History    Date Active Date Inactive Code Status Order ID Comments User Context   This patient has a current code status but no historical code status.      TOTAL TIME TAKING CARE OF THIS PATIENT: 40 minutes.    Henreitta Leber M.D on 02/16/2017 at 2:41 PM  Between 7am to 6pm - Pager - [phone]  After 6pm go to [email or web address] - Proofreader  [facility]  Office  [phone]  CC: Primary care physician; [name], MD

## 2017-02-16 NOTE — Evaluation (Signed)
Occupational Therapy Evaluation Patient Details Name: Jill Hudson MRN: [id number] DOB: 05-20-1935 Today's Date: 02/16/2017    History of Present Illness 82 y.o. female with a known history of  DM2, HTN, HLD, CAD being followed by vascular surgery who developed acute onset of left lower extremity weakness as well as left hand weakness. CT head negative, MRI indicates acute lacunar infarct in R thalamus.    Clinical Impression   Pt seen for OT evaluation. Pt reports LUE still feels a little heavier than RUE, but improved from first presenting to ED yesterday. Pt modified independent with functional mobility and self care tasks, decreased confidence with functional mobility with LLE. Recommending pt have HHOT to address LUE weakness, home safety, and education/training in home layout and routines modifications to maximize safety and falls prevention and maximize return to PLOF.    Follow Up Recommendations  Home health OT    Equipment Recommendations  None recommended by OT    Recommendations for Other Services       Precautions / Restrictions Precautions Precautions: Fall Restrictions Weight Bearing Restrictions: No      Mobility Bed Mobility              General bed mobility comments: did not assess, pt up in recliner at start of session  Transfers Overall transfer level: Modified independent Equipment used: None             General transfer comment: slightly increased time, as pt was feeling R hip was a bit stiff after sitting for a longer period of time, but good balance, no LOB    Balance Overall balance assessment: Modified Independent                                         ADL either performed or assessed with clinical judgement   ADL Overall ADL's : Modified independent                                       General ADL Comments: pt able to perform functional mobility and self care tasks at baseline, more cautious  with movements but no LOB, pt states "I don't trust my feet quite yet"     Vision   Eye Alignment: Within Functional Limits Ocular Range of Motion: Within Functional Limits Alignment/Gaze Preference: Within Defined Limits Tracking/Visual Pursuits: Able to track stimulus in all quads without difficulty Convergence: Within functional limits     Perception     Praxis      Pertinent Vitals/Pain Pain Assessment: 0-10 Pain Score: 6  Pain Location: R hip (arthritis pain) Pain Descriptors / Indicators: Aching Pain Intervention(s): Premedicated before session;Limited activity within patient's tolerance;Monitored during session;Repositioned     Hand Dominance     Extremity/Trunk Assessment Upper Extremity Assessment Upper Extremity Assessment: Overall WFL for tasks assessed (L shoulder flexion 4-/5, all else grossly 4/5 bilaterally, ROM WFL)   Lower Extremity Assessment Lower Extremity Assessment: Defer to PT evaluation;Overall [facility] for tasks assessed   Cervical / Trunk Assessment Cervical / Trunk Assessment: Normal   Communication Communication Communication: No difficulties   Cognition Arousal/Alertness: Awake/alert Behavior During Therapy: WFL for tasks assessed/performed Overall Cognitive Status: Within Functional Limits for tasks assessed  General Comments       Exercises Other Exercises Other Exercises: pt educated in body positioning and environmental/home modifications to minimize LOB/falls and maximize functional independence and safety    Shoulder Instructions      Home Living Family/patient expects to be discharged to:: Private residence Living Arrangements: Spouse/significant other Available Help at Discharge: Family;Available 24 hours/day Type of Home: House Home Access: Stairs to enter [employer] of Steps: 4 Entrance Stairs-Rails: Can reach both Home Layout: Two level;Able to live on main  level with bedroom/bathroom     Bathroom Shower/Tub: Walk-in shower;Door   [employer] Toilet: Handicapped height Bathroom Accessibility: Yes How Accessible: Accessible via walker Home Equipment: Grab bars - tub/shower;Grab bars - toilet;Walker - 2 wheels;Walker - 4 wheels;Cane - single point;Shower seat;Shower seat - built in   Additional Comments: access to 2ww, spc, shower chair      Prior Functioning/Environment Level of Independence: Independent        Comments: Pt reports that she is regularly out of the house, runs errands, stays active, enjoys being outdoors and yardwork/gardening; endorses no falls in past 12 months        OT Problem List:        OT Treatment/Interventions:      OT Goals(Current goals can be found in the care plan section) Acute Rehab OT Goals Patient Stated Goal: go home OT Goal Formulation: With patient Time For Goal Achievement: 02/23/17 Potential to Achieve Goals: Good  OT Frequency:     Barriers to D/C:            Co-evaluation              End of Session    Activity Tolerance: Patient tolerated treatment well Patient left: Other (comment) (sitting EOB with CNA for vitals check and removing heart monitor)  OT Visit Diagnosis: Other abnormalities of gait and mobility (R26.89);Muscle weakness (generalized) (M62.81);Pain;Other symptoms and signs involving cognitive function Pain - Right/Left: Right Pain - part of body: Hip                Time: 1354-1414 OT Time Calculation (min): 20 min Charges:  OT General Charges $OT Visit: 1 Procedure OT Evaluation $OT Eval Low Complexity: 1 Procedure OT Treatments $Self Care/Home Management : 8-22 mins G-Codes:     Jeni Salles, MPH, MS, OTR/L ascom [phone] 02/16/17, 2:32 PM

## 2017-02-16 NOTE — Progress Notes (Signed)
SLP Cancellation Note  Patient Details Name: ERLINDA SOLINGER MRN: [id number] DOB: [birth year]   Cancelled treatment:       Reason Eval/Treat Not Completed: SLP screened, no needs identified, will sign off (chart reviewed; consulted NSG then met w/ pt).  Pt denied any difficulty swallowing and is currently on a regular diet; tolerates swallowing pills w/ water per NSG. Pt conversed at conversational level w/out deficits noted; pt was reading the newspaper upon entering the room. Pt denied any speech-language deficits.  No further skilled ST services indicated as pt appears at her baseline. Pt agreed. NSG to reconsult if any change in status.    Orinda Kenner, MS, CCC-SLP Watson,Katherine 02/16/2017, 10:07 AM

## 2017-02-16 NOTE — Care Management (Signed)
Admitted to this facility with the diagnosis of CVA. Lives with husband, Kermit. Daughter is Judson Roch Gains [phone]). Last seen Dr. Army Melia 12/21/16. Prescriptions are filled at [facility] in [place]. No home health. No skilled facility. Rolling walker and cane in the home. No Life Alert. Thinking about getting Life Alert. Takes care of all basic and instrumental activities of daily living, drives. No falls. Great appetite. Daughter will transport. Physical therapy evaluation completed. Recommends home with home health and physical therapy. Ms. Vavrek is in agreement. Would like [facility]. Will update Floydene Flock, [facility] representative. Discharge to home today per Dr. Darletta Moll RN MSN CCM Care Management [phone]

## 2017-02-17 DIAGNOSIS — I080 Rheumatic disorders of both mitral and aortic valves: Secondary | ICD-10-CM | POA: Diagnosis not present

## 2017-02-17 DIAGNOSIS — M1990 Unspecified osteoarthritis, unspecified site: Secondary | ICD-10-CM | POA: Diagnosis not present

## 2017-02-17 DIAGNOSIS — I10 Essential (primary) hypertension: Secondary | ICD-10-CM | POA: Diagnosis not present

## 2017-02-17 DIAGNOSIS — E1142 Type 2 diabetes mellitus with diabetic polyneuropathy: Secondary | ICD-10-CM | POA: Diagnosis not present

## 2017-02-17 DIAGNOSIS — Z87891 Personal history of nicotine dependence: Secondary | ICD-10-CM | POA: Diagnosis not present

## 2017-02-17 DIAGNOSIS — F419 Anxiety disorder, unspecified: Secondary | ICD-10-CM | POA: Diagnosis not present

## 2017-02-17 DIAGNOSIS — Z794 Long term (current) use of insulin: Secondary | ICD-10-CM | POA: Diagnosis not present

## 2017-02-17 DIAGNOSIS — E1151 Type 2 diabetes mellitus with diabetic peripheral angiopathy without gangrene: Secondary | ICD-10-CM | POA: Diagnosis not present

## 2017-02-17 DIAGNOSIS — Z7902 Long term (current) use of antithrombotics/antiplatelets: Secondary | ICD-10-CM | POA: Diagnosis not present

## 2017-02-17 DIAGNOSIS — I69354 Hemiplegia and hemiparesis following cerebral infarction affecting left non-dominant side: Secondary | ICD-10-CM | POA: Diagnosis not present

## 2017-02-17 DIAGNOSIS — Z7982 Long term (current) use of aspirin: Secondary | ICD-10-CM | POA: Diagnosis not present

## 2017-02-17 LAB — HEMOGLOBIN A1C
Hgb A1c MFr Bld: 8.8 % — ABNORMAL HIGH (ref 4.8–5.6)
MEAN PLASMA GLUCOSE: 206 mg/dL

## 2017-02-17 LAB — ECHOCARDIOGRAM COMPLETE
HEIGHTINCHES: 61.5 in
Weight: 1881.6 oz

## 2017-02-19 DIAGNOSIS — R05 Cough: Secondary | ICD-10-CM | POA: Diagnosis not present

## 2017-02-19 DIAGNOSIS — Z794 Long term (current) use of insulin: Secondary | ICD-10-CM | POA: Diagnosis not present

## 2017-02-19 DIAGNOSIS — I639 Cerebral infarction, unspecified: Secondary | ICD-10-CM | POA: Diagnosis not present

## 2017-02-19 DIAGNOSIS — E1165 Type 2 diabetes mellitus with hyperglycemia: Secondary | ICD-10-CM | POA: Diagnosis not present

## 2017-02-19 DIAGNOSIS — Z09 Encounter for follow-up examination after completed treatment for conditions other than malignant neoplasm: Secondary | ICD-10-CM | POA: Diagnosis not present

## 2017-02-22 DIAGNOSIS — I080 Rheumatic disorders of both mitral and aortic valves: Secondary | ICD-10-CM | POA: Diagnosis not present

## 2017-02-22 DIAGNOSIS — M1990 Unspecified osteoarthritis, unspecified site: Secondary | ICD-10-CM | POA: Diagnosis not present

## 2017-02-22 DIAGNOSIS — I10 Essential (primary) hypertension: Secondary | ICD-10-CM | POA: Diagnosis not present

## 2017-02-22 DIAGNOSIS — E1142 Type 2 diabetes mellitus with diabetic polyneuropathy: Secondary | ICD-10-CM | POA: Diagnosis not present

## 2017-02-22 DIAGNOSIS — I69354 Hemiplegia and hemiparesis following cerebral infarction affecting left non-dominant side: Secondary | ICD-10-CM | POA: Diagnosis not present

## 2017-02-22 DIAGNOSIS — E1151 Type 2 diabetes mellitus with diabetic peripheral angiopathy without gangrene: Secondary | ICD-10-CM | POA: Diagnosis not present

## 2017-02-24 DIAGNOSIS — E1151 Type 2 diabetes mellitus with diabetic peripheral angiopathy without gangrene: Secondary | ICD-10-CM | POA: Diagnosis not present

## 2017-02-24 DIAGNOSIS — I69354 Hemiplegia and hemiparesis following cerebral infarction affecting left non-dominant side: Secondary | ICD-10-CM | POA: Diagnosis not present

## 2017-02-24 DIAGNOSIS — E1142 Type 2 diabetes mellitus with diabetic polyneuropathy: Secondary | ICD-10-CM | POA: Diagnosis not present

## 2017-02-24 DIAGNOSIS — I080 Rheumatic disorders of both mitral and aortic valves: Secondary | ICD-10-CM | POA: Diagnosis not present

## 2017-02-24 DIAGNOSIS — M1990 Unspecified osteoarthritis, unspecified site: Secondary | ICD-10-CM | POA: Diagnosis not present

## 2017-02-24 DIAGNOSIS — I10 Essential (primary) hypertension: Secondary | ICD-10-CM | POA: Diagnosis not present

## 2017-02-26 DIAGNOSIS — I69354 Hemiplegia and hemiparesis following cerebral infarction affecting left non-dominant side: Secondary | ICD-10-CM | POA: Diagnosis not present

## 2017-02-26 DIAGNOSIS — E1151 Type 2 diabetes mellitus with diabetic peripheral angiopathy without gangrene: Secondary | ICD-10-CM | POA: Diagnosis not present

## 2017-02-26 DIAGNOSIS — I10 Essential (primary) hypertension: Secondary | ICD-10-CM | POA: Diagnosis not present

## 2017-02-26 DIAGNOSIS — E1142 Type 2 diabetes mellitus with diabetic polyneuropathy: Secondary | ICD-10-CM | POA: Diagnosis not present

## 2017-02-26 DIAGNOSIS — M1990 Unspecified osteoarthritis, unspecified site: Secondary | ICD-10-CM | POA: Diagnosis not present

## 2017-02-26 DIAGNOSIS — I080 Rheumatic disorders of both mitral and aortic valves: Secondary | ICD-10-CM | POA: Diagnosis not present

## 2017-03-01 DIAGNOSIS — I69354 Hemiplegia and hemiparesis following cerebral infarction affecting left non-dominant side: Secondary | ICD-10-CM | POA: Diagnosis not present

## 2017-03-01 DIAGNOSIS — E1142 Type 2 diabetes mellitus with diabetic polyneuropathy: Secondary | ICD-10-CM | POA: Diagnosis not present

## 2017-03-01 DIAGNOSIS — I080 Rheumatic disorders of both mitral and aortic valves: Secondary | ICD-10-CM | POA: Diagnosis not present

## 2017-03-01 DIAGNOSIS — I10 Essential (primary) hypertension: Secondary | ICD-10-CM | POA: Diagnosis not present

## 2017-03-01 DIAGNOSIS — M1990 Unspecified osteoarthritis, unspecified site: Secondary | ICD-10-CM | POA: Diagnosis not present

## 2017-03-01 DIAGNOSIS — E1151 Type 2 diabetes mellitus with diabetic peripheral angiopathy without gangrene: Secondary | ICD-10-CM | POA: Diagnosis not present

## 2017-03-05 DIAGNOSIS — E1151 Type 2 diabetes mellitus with diabetic peripheral angiopathy without gangrene: Secondary | ICD-10-CM | POA: Diagnosis not present

## 2017-03-05 DIAGNOSIS — I10 Essential (primary) hypertension: Secondary | ICD-10-CM | POA: Diagnosis not present

## 2017-03-05 DIAGNOSIS — I69354 Hemiplegia and hemiparesis following cerebral infarction affecting left non-dominant side: Secondary | ICD-10-CM | POA: Diagnosis not present

## 2017-03-05 DIAGNOSIS — E1142 Type 2 diabetes mellitus with diabetic polyneuropathy: Secondary | ICD-10-CM | POA: Diagnosis not present

## 2017-03-05 DIAGNOSIS — I080 Rheumatic disorders of both mitral and aortic valves: Secondary | ICD-10-CM | POA: Diagnosis not present

## 2017-03-05 DIAGNOSIS — M1990 Unspecified osteoarthritis, unspecified site: Secondary | ICD-10-CM | POA: Diagnosis not present

## 2017-03-09 DIAGNOSIS — E1142 Type 2 diabetes mellitus with diabetic polyneuropathy: Secondary | ICD-10-CM | POA: Diagnosis not present

## 2017-03-09 DIAGNOSIS — I69354 Hemiplegia and hemiparesis following cerebral infarction affecting left non-dominant side: Secondary | ICD-10-CM | POA: Diagnosis not present

## 2017-03-09 DIAGNOSIS — I080 Rheumatic disorders of both mitral and aortic valves: Secondary | ICD-10-CM | POA: Diagnosis not present

## 2017-03-09 DIAGNOSIS — E1151 Type 2 diabetes mellitus with diabetic peripheral angiopathy without gangrene: Secondary | ICD-10-CM | POA: Diagnosis not present

## 2017-03-09 DIAGNOSIS — M1990 Unspecified osteoarthritis, unspecified site: Secondary | ICD-10-CM | POA: Diagnosis not present

## 2017-03-09 DIAGNOSIS — I10 Essential (primary) hypertension: Secondary | ICD-10-CM | POA: Diagnosis not present

## 2017-03-10 ENCOUNTER — Encounter: Payer: Self-pay | Admitting: Internal Medicine

## 2017-03-10 ENCOUNTER — Ambulatory Visit (INDEPENDENT_AMBULATORY_CARE_PROVIDER_SITE_OTHER): Payer: Medicare Other | Admitting: Internal Medicine

## 2017-03-10 VITALS — BP 130/58 | HR 70 | Ht 61.0 in | Wt 114.4 lb

## 2017-03-10 DIAGNOSIS — I63331 Cerebral infarction due to thrombosis of right posterior cerebral artery: Secondary | ICD-10-CM

## 2017-03-10 DIAGNOSIS — E1142 Type 2 diabetes mellitus with diabetic polyneuropathy: Secondary | ICD-10-CM

## 2017-03-10 DIAGNOSIS — Z794 Long term (current) use of insulin: Secondary | ICD-10-CM

## 2017-03-10 DIAGNOSIS — I639 Cerebral infarction, unspecified: Secondary | ICD-10-CM

## 2017-03-10 DIAGNOSIS — E1165 Type 2 diabetes mellitus with hyperglycemia: Secondary | ICD-10-CM

## 2017-03-10 DIAGNOSIS — IMO0002 Reserved for concepts with insufficient information to code with codable children: Secondary | ICD-10-CM

## 2017-03-10 DIAGNOSIS — I10 Essential (primary) hypertension: Secondary | ICD-10-CM | POA: Diagnosis not present

## 2017-03-10 NOTE — Progress Notes (Signed)
Date:  03/10/2017   Name:  Jill Hudson   DOB:  1934-11-07   MRN:  [id number]   Chief Complaint: Heat Exposure (Follow up- doing good. Therapy said can't tell in body she had stroke. ) CVA - had left leg weakness onset.  Seen at ER and admitted with small lacunar stroke.  She is doing home PT and doing well.  The only change in her medication ws to increase lipitor to 40 mg. Admitted to [facility] 4/23-4/24/18.  DM - now on basaglar 12 units bid.  Sometimes takes less at night if sugar is low.  Tried Victoza but made her very sick. She might consider trying a once a week.  HTN - doing well on current medications.  Lab Results  Component Value Date   HGBA1C 8.8 (H) 02/16/2017    Review of Systems  Constitutional: Negative for chills, fatigue and fever.  Respiratory: Negative for cough, shortness of breath and wheezing.   Cardiovascular: Negative for chest pain, palpitations and leg swelling.  Gastrointestinal: Negative for abdominal pain.  Musculoskeletal: Positive for arthralgias and myalgias.  Neurological: Positive for weakness. Negative for dizziness and headaches.    Patient Active Problem List   Diagnosis Date Noted  . CVA (cerebral vascular accident) ([place]) 02/15/2017  . Degenerative disc disease, lumbar 12/21/2016  . Atherosclerosis of native arteries of extremity with intermittent claudication ([place]) 12/20/2016  . Bilateral carotid artery stenosis 12/20/2016  . Hip bursitis 05/18/2016  . Elevated TSH 01/18/2016  . Chronic renal insufficiency, stage III (moderate) 01/16/2016  . Senile ecchymosis 01/16/2016  . Uncontrolled type 2 diabetes mellitus with diabetic polyneuropathy, with long-term current use of insulin ([place]) 09/16/2015  . GERD (gastroesophageal reflux disease) 07/24/2015  . Peripheral blood vessel disorder ([place]) 05/17/2015  . Neoplasm of uncertain behavior of skin 05/17/2015  . Background retinopathy due to secondary diabetes ([place]) 04/11/2015  . Diabetes  mellitus with polyneuropathy ([place]) 04/11/2015  . HLD (hyperlipidemia) 04/11/2015  . Essential (primary) hypertension 04/11/2015  . Generalized OA 04/11/2015  . Arterial insufficiency ([place]) 04/11/2015  . Arteriosclerosis of coronary artery 05/29/2013  . Hypertensive heart disease without CHF 05/29/2013    Prior to Admission medications   Medication Sig Start Date End Date Taking? Authorizing Provider  Acetaminophen (TYLENOL PO) Take by mouth. Takes one 650 mg tablet + one 500 ng tablet at bedtime   Yes [provider]  aspirin 81 MG tablet Take 1 tablet by mouth daily.   Yes [provider]  atorvastatin (LIPITOR) 40 MG tablet Take 1 tablet (40 mg total) by mouth daily. 02/16/17  Yes Henreitta Leber, MD  B Complex Vitamins (VITAMIN-B COMPLEX) TABS Take 1 tablet by mouth daily.    Yes [provider]  B-D ULTRAFINE III SHORT PEN 31G X 8 MM MISC USE AS DIRECTED( APPLY BY DOSE NOT DAILY) 02/09/17  Yes [name], MD  Biotin 10 MG TABS Take 1 tablet by mouth daily.    Yes [provider]  clopidogrel (PLAVIX) 75 MG tablet Take 1 tablet by mouth daily.   Yes [provider]  Cyanocobalamin (RA VITAMIN B-12 TR) 1000 MCG TBCR Take 1 tablet by mouth daily.    Yes [provider]  diltiazem (CARTIA XT) 180 MG 24 hr capsule Take 1 tablet by mouth 2 (two) times daily.   Yes [provider]  Insulin Glargine (LANTUS SOLOSTAR) 100 UNIT/ML Solostar Pen Inject 8 Units into the skin 2 (two) times daily.  Yes [provider]  loratadine (CLARITIN) 10 MG tablet Take 10 mg by mouth daily.   Yes [provider]  losartan (COZAAR) 50 MG tablet Take 50 mg by mouth daily. 11/27/16 11/27/17 Yes [provider]  metFORMIN (GLUCOPHAGE-XR) 500 MG 24 hr tablet TAKE 1 TABLET BY MOUTH TWICE DAILY 12/25/16  Yes [name], MD  Multiple Vitamin (MULTI-VITAMINS) TABS Take 1 tablet by mouth daily.    Yes [provider]    Multiple Vitamins-Minerals (PRESERVISION AREDS PO) Take 1 tablet by mouth 2 (two) times daily.   Yes [provider]  nystatin-triamcinolone (MYCOLOG II) cream Apply 1 application topically 2 (two) times daily. 12/21/16  Yes [name], MD  Omega 3 1000 MG CAPS Take 1 capsule by mouth daily.  12/12/09  Yes [provider]  traMADol (ULTRAM) 50 MG tablet Take 50 mg by mouth every 6 (six) hours as needed.  09/16/16  Yes [provider]  gabapentin (NEURONTIN) 100 MG capsule Take 100 mg by mouth at bedtime as needed.  09/16/16 11/15/16  [provider]    Allergies  Allergen Reactions  . Saxagliptin Diarrhea  . Codeine     Upset stomach  . Epinephrine   . Ezetimibe Other (See Comments)    myalgias  . Nitrofurantoin     Pruitus, rash  . Limonene Rash  . Sulfa Antibiotics Rash and Other (See Comments)    Sore mouth     Past Surgical History:  Procedure Laterality Date  . CARDIAC CATHETERIZATION  1998   40% LM, 95% Ramus interm  . PTCA  08/2013   Left common iliac  . PTCA  12/2012   left ext iliac    Social History  Substance Use Topics  . Smoking status: Former Research scientist (life sciences)  . Smokeless tobacco: Never Used  . Alcohol use No     Medication list has been reviewed and updated.   Physical Exam  Constitutional: She is oriented to person, place, and time. She appears well-developed. No distress.  HENT:  Head: Normocephalic and atraumatic.  Neck: Normal range of motion. Neck supple.  Cardiovascular: Normal rate, regular rhythm and normal heart sounds.   Pulmonary/Chest: Effort normal and breath sounds normal. No respiratory distress. She has no wheezes.  Abdominal: Soft.  Musculoskeletal: She exhibits no edema or tenderness.  Neurological: She is alert and oriented to person, place, and time. She has normal reflexes.  4+/5 in left lower extremity  Skin: Skin is warm and dry. No rash noted.  Psychiatric: She has a normal mood and affect.  Her behavior is normal. Thought content normal.  Nursing note and vitals reviewed.   BP (!) 130/58 (BP Location: Left Arm, Patient Position: Sitting, Cuff Size: Normal)   Pulse 70   Ht 5\' 1"  (1.549 m)   Wt 114 lb 6.4 oz (51.9 kg)   SpO2 100%   BMI 21.62 kg/m   Assessment and Plan: 1. Cerebrovascular accident (CVA) due to thrombosis of right posterior cerebral artery ([place]) Doing well - continue Plavix and aspirin  2. Essential (primary) hypertension controlled  3. Uncontrolled type 2 diabetes mellitus with diabetic polyneuropathy, with long-term current use of insulin (H[facility]) Continue insulin and management by Endocrinology   No orders of the defined types were placed in this encounter.   [name], MD [facility]  03/10/2017

## 2017-03-16 DIAGNOSIS — I10 Essential (primary) hypertension: Secondary | ICD-10-CM | POA: Diagnosis not present

## 2017-03-16 DIAGNOSIS — I69354 Hemiplegia and hemiparesis following cerebral infarction affecting left non-dominant side: Secondary | ICD-10-CM | POA: Diagnosis not present

## 2017-03-16 DIAGNOSIS — I080 Rheumatic disorders of both mitral and aortic valves: Secondary | ICD-10-CM | POA: Diagnosis not present

## 2017-03-16 DIAGNOSIS — E1142 Type 2 diabetes mellitus with diabetic polyneuropathy: Secondary | ICD-10-CM | POA: Diagnosis not present

## 2017-03-16 DIAGNOSIS — M1990 Unspecified osteoarthritis, unspecified site: Secondary | ICD-10-CM | POA: Diagnosis not present

## 2017-03-16 DIAGNOSIS — E1151 Type 2 diabetes mellitus with diabetic peripheral angiopathy without gangrene: Secondary | ICD-10-CM | POA: Diagnosis not present

## 2017-03-17 ENCOUNTER — Other Ambulatory Visit: Payer: Self-pay | Admitting: Physician Assistant

## 2017-03-26 DIAGNOSIS — E1165 Type 2 diabetes mellitus with hyperglycemia: Secondary | ICD-10-CM | POA: Diagnosis not present

## 2017-03-26 DIAGNOSIS — Z794 Long term (current) use of insulin: Secondary | ICD-10-CM | POA: Diagnosis not present

## 2017-03-29 ENCOUNTER — Encounter (INDEPENDENT_AMBULATORY_CARE_PROVIDER_SITE_OTHER): Payer: Medicare Other | Admitting: Internal Medicine

## 2017-03-29 DIAGNOSIS — I63331 Cerebral infarction due to thrombosis of right posterior cerebral artery: Secondary | ICD-10-CM

## 2017-03-29 DIAGNOSIS — IMO0002 Reserved for concepts with insufficient information to code with codable children: Secondary | ICD-10-CM

## 2017-03-29 DIAGNOSIS — E1142 Type 2 diabetes mellitus with diabetic polyneuropathy: Secondary | ICD-10-CM

## 2017-03-29 DIAGNOSIS — M159 Polyosteoarthritis, unspecified: Secondary | ICD-10-CM

## 2017-03-29 DIAGNOSIS — E1165 Type 2 diabetes mellitus with hyperglycemia: Secondary | ICD-10-CM

## 2017-03-29 DIAGNOSIS — Z794 Long term (current) use of insulin: Secondary | ICD-10-CM

## 2017-03-29 DIAGNOSIS — I10 Essential (primary) hypertension: Secondary | ICD-10-CM

## 2017-03-29 NOTE — Progress Notes (Signed)
Received orders from [facility] of care 02/17/17 through 04/17/17. Orders are reviewed, signed and faxed.

## 2017-04-06 DIAGNOSIS — I119 Hypertensive heart disease without heart failure: Secondary | ICD-10-CM | POA: Diagnosis not present

## 2017-04-06 DIAGNOSIS — M791 Myalgia: Secondary | ICD-10-CM | POA: Diagnosis not present

## 2017-04-06 DIAGNOSIS — I739 Peripheral vascular disease, unspecified: Secondary | ICD-10-CM | POA: Diagnosis not present

## 2017-04-06 DIAGNOSIS — E7800 Pure hypercholesterolemia, unspecified: Secondary | ICD-10-CM | POA: Diagnosis not present

## 2017-04-06 DIAGNOSIS — Z79899 Other long term (current) drug therapy: Secondary | ICD-10-CM | POA: Diagnosis not present

## 2017-04-06 DIAGNOSIS — I2510 Atherosclerotic heart disease of native coronary artery without angina pectoris: Secondary | ICD-10-CM | POA: Diagnosis not present

## 2017-04-06 DIAGNOSIS — Z5181 Encounter for therapeutic drug level monitoring: Secondary | ICD-10-CM | POA: Diagnosis not present

## 2017-04-09 ENCOUNTER — Emergency Department: Payer: Medicare Other

## 2017-04-09 ENCOUNTER — Emergency Department
Admission: EM | Admit: 2017-04-09 | Discharge: 2017-04-09 | Disposition: A | Discharge status: Home / Self Care | Payer: Medicare Other | Attending: Emergency Medicine | Admitting: Emergency Medicine

## 2017-04-09 DIAGNOSIS — E119 Type 2 diabetes mellitus without complications: Secondary | ICD-10-CM | POA: Diagnosis not present

## 2017-04-09 DIAGNOSIS — R531 Weakness: Secondary | ICD-10-CM | POA: Diagnosis not present

## 2017-04-09 DIAGNOSIS — G459 Transient cerebral ischemic attack, unspecified: Secondary | ICD-10-CM | POA: Diagnosis not present

## 2017-04-09 DIAGNOSIS — Z7984 Long term (current) use of oral hypoglycemic drugs: Secondary | ICD-10-CM | POA: Insufficient documentation

## 2017-04-09 DIAGNOSIS — I639 Cerebral infarction, unspecified: Secondary | ICD-10-CM

## 2017-04-09 DIAGNOSIS — Z7902 Long term (current) use of antithrombotics/antiplatelets: Secondary | ICD-10-CM | POA: Diagnosis not present

## 2017-04-09 DIAGNOSIS — Z87891 Personal history of nicotine dependence: Secondary | ICD-10-CM | POA: Insufficient documentation

## 2017-04-09 DIAGNOSIS — Z794 Long term (current) use of insulin: Secondary | ICD-10-CM | POA: Diagnosis not present

## 2017-04-09 DIAGNOSIS — I10 Essential (primary) hypertension: Secondary | ICD-10-CM | POA: Diagnosis not present

## 2017-04-09 DIAGNOSIS — Z7982 Long term (current) use of aspirin: Secondary | ICD-10-CM | POA: Insufficient documentation

## 2017-04-09 DIAGNOSIS — M6281 Muscle weakness (generalized): Secondary | ICD-10-CM | POA: Diagnosis not present

## 2017-04-09 DIAGNOSIS — M79605 Pain in left leg: Secondary | ICD-10-CM | POA: Diagnosis not present

## 2017-04-09 LAB — BASIC METABOLIC PANEL
Anion gap: 11 (ref 5–15)
BUN: 29 mg/dL — ABNORMAL HIGH (ref 6–20)
CALCIUM: 9.4 mg/dL (ref 8.9–10.3)
CO2: 23 mmol/L (ref 22–32)
CREATININE: 0.97 mg/dL (ref 0.44–1.00)
Chloride: 105 mmol/L (ref 101–111)
GFR calc non Af Amer: 54 mL/min — ABNORMAL LOW (ref >60)
Glucose, Bld: 158 mg/dL — ABNORMAL HIGH (ref 65–99)
Potassium: 4.9 mmol/L (ref 3.5–5.1)
SODIUM: 139 mmol/L (ref 135–145)

## 2017-04-09 LAB — CBC
HCT: 31.5 % — ABNORMAL LOW (ref 35.0–47.0)
Hemoglobin: 10.6 g/dL — ABNORMAL LOW (ref 12.0–16.0)
MCH: 29.5 pg (ref 26.0–34.0)
MCHC: 33.6 g/dL (ref 32.0–36.0)
MCV: 87.6 fL (ref 80.0–100.0)
PLATELETS: 233 10*3/uL (ref 150–440)
RBC: 3.6 MIL/uL — AB (ref 3.80–5.20)
RDW: 13.9 % (ref 11.5–14.5)
WBC: 8.5 10*3/uL (ref 3.6–11.0)

## 2017-04-09 LAB — PROTIME-INR
INR: 1.08
Prothrombin Time: 14 seconds (ref 11.4–15.2)

## 2017-04-09 LAB — APTT: APTT: 34 s (ref 24–36)

## 2017-04-09 LAB — TROPONIN I: Troponin I: <0.03 ng/mL (ref <0.03)

## 2017-04-09 NOTE — ED Notes (Signed)
Dr. Reynolds at bedside.

## 2017-04-09 NOTE — ED Notes (Signed)
Pt lying flat

## 2017-04-09 NOTE — ED Triage Notes (Signed)
Per EMS pt was at home and felt "jittery".  Pt has a history of feeling dizzy and the last time she felt this way she went to the hospital and she had a stroke.  Pt has hx of PAD.  Pt states left leg feels very heavy.

## 2017-04-09 NOTE — ED Notes (Signed)
CODE STROKE CALLED TO [id number] 

## 2017-04-09 NOTE — ED Notes (Signed)
Pt states dull ache on left side of head

## 2017-04-09 NOTE — Consult Note (Signed)
Referring Physician: Corky Downs     Chief Complaint: Left leg weakness  HPI: Jill Hudson is an 82 y.o. female who reports that she awakened this morning with right sided headache and generalized weakness.  Felt that her left leg was "off" as well.  Patient had a stroke in April of this year that affected her LLE extremity.  She reports that she still has some deficit from that.  Was advised to present for evaluation.  Initial NIHSS of 1.  Patient on ASA and Plavix. Headache right frontal.  Blood sugar at home was 101.      Date last known well: Date: 04/08/2017 Time last known well: Time: 23:00 tPA Given: No: Outside time window, recent infarct  Past Medical History:  Diagnosis Date  . Anxiety   . Arthritis   . Diabetes mellitus without complication ([place])   . Hyperlipidemia   . Hypertension   . Mitral and aortic valve disease   . Occasional tremors   . Shingles   . UTI (urinary tract infection)   . Vascular disease, peripheral [facility])     Past Surgical History:  Procedure Laterality Date  . CARDIAC CATHETERIZATION  1998   40% LM, 95% Ramus interm  . PTCA  08/2013   Left common iliac  . PTCA  12/2012   left ext iliac    Family History  Problem Relation Age of Onset  . Dementia Mother   . Diabetes Father    Social History:  reports that she has quit smoking. She has never used smokeless tobacco. She reports that she does not drink alcohol or use drugs.  Allergies:  Allergies  Allergen Reactions  . Saxagliptin Diarrhea  . Codeine     Upset stomach  . Epinephrine   . Ezetimibe Other (See Comments)    myalgias  . Nitrofurantoin     Pruitus, rash  . Limonene Rash  . Sulfa Antibiotics Rash and Other (See Comments)    Sore mouth     Medications: I have reviewed the patient's current medications. Prior to Admission:  Prior to Admission medications   Medication Sig Start Date End Date Taking? Authorizing Provider  clopidogrel (PLAVIX) 75 MG tablet Take 1 tablet by  mouth daily.   Yes [provider]  Acetaminophen (TYLENOL PO) Take by mouth. Takes one 650 mg tablet + one 500 ng tablet at bedtime    [provider]  aspirin 81 MG tablet Take 1 tablet by mouth daily.    [provider]  atorvastatin (LIPITOR) 40 MG tablet Take 1 tablet (40 mg total) by mouth daily. 02/16/17   Henreitta Leber, MD  B Complex Vitamins (VITAMIN-B COMPLEX) TABS Take 1 tablet by mouth daily.     [provider]  B-D ULTRAFINE III SHORT PEN 31G X 8 MM MISC USE AS DIRECTED( APPLY BY DOSE NOT DAILY) 02/09/17   [name], MD  Biotin 10 MG TABS Take 1 tablet by mouth daily.     [provider]  Cyanocobalamin (RA VITAMIN B-12 TR) 1000 MCG TBCR Take 1 tablet by mouth daily.     [provider]  diltiazem (CARTIA XT) 180 MG 24 hr capsule Take 1 tablet by mouth 2 (two) times daily.    [provider]  gabapentin (NEURONTIN) 100 MG capsule Take 100 mg by mouth at bedtime as needed.  09/16/16 11/15/16  [provider]  Insulin Glargine (BASAGLAR KWIKPEN [place]) Inject 12 Units into the skin 2 (two)  times daily at 10 AM and 5 PM.    [provider]  loratadine (CLARITIN) 10 MG tablet Take 10 mg by mouth daily.    [provider]  losartan (COZAAR) 50 MG tablet Take 50 mg by mouth daily. 11/27/16 11/27/17  [provider]  metFORMIN (GLUCOPHAGE-XR) 500 MG 24 hr tablet TAKE 1 TABLET BY MOUTH TWICE DAILY 12/25/16   [name], MD  Multiple Vitamin (MULTI-VITAMINS) TABS Take 1 tablet by mouth daily.     [provider]  Multiple Vitamins-Minerals (PRESERVISION AREDS PO) Take 1 tablet by mouth 2 (two) times daily.    [provider]  nystatin-triamcinolone (MYCOLOG II) cream Apply 1 application topically 2 (two) times daily. 12/21/16   [name], MD  Omega 3 1000 MG CAPS Take 1 capsule by mouth daily.  12/12/09   [provider]  traMADol (ULTRAM) 50 MG tablet Take  50 mg by mouth every 6 (six) hours as needed.  09/16/16   [provider]     ROS: History obtained from the patient  General ROS: negative for - chills, fatigue, fever, night sweats, weight gain or weight loss Psychological ROS: negative for - behavioral disorder, hallucinations, memory difficulties, mood swings or suicidal ideation Ophthalmic ROS: negative for - blurry vision, double vision, eye pain or loss of vision ENT ROS: negative for - epistaxis, nasal discharge, oral lesions, sore throat, tinnitus or vertigo Allergy and Immunology ROS: negative for - hives or itchy/watery eyes Hematological and Lymphatic ROS: negative for - bleeding problems, bruising or swollen lymph nodes Endocrine ROS: negative for - galactorrhea, hair pattern changes, polydipsia/polyuria or temperature intolerance Respiratory ROS: negative for - cough, hemoptysis, shortness of breath or wheezing Cardiovascular ROS: negative for - chest pain, dyspnea on exertion, edema or irregular heartbeat Gastrointestinal ROS: negative for - abdominal pain, diarrhea, hematemesis, nausea/vomiting or stool incontinence Genito-Urinary ROS: negative for - dysuria, hematuria, incontinence or urinary frequency/urgency Musculoskeletal ROS: negative for - joint swelling or muscular weakness Neurological ROS: as noted in HPI Dermatological ROS: negative for rash and skin lesion changes  Physical Examination: Blood pressure (!) 177/53, pulse (!) 130, resp. rate 14, height 5' 1.5" (1.562 m), weight 52.6 kg (116 lb), SpO2 99 %.  HEENT-  Normocephalic, no lesions, without obvious abnormality.  Normal external eye and conjunctiva.  Normal TM's bilaterally.  Normal auditory canals and external ears. Normal external nose, mucus membranes and septum.  Normal pharynx. Cardiovascular- S1, S2 normal, pulses palpable throughout   Lungs- chest clear, no wheezing, rales, normal symmetric air entry Abdomen- soft, non-tender; bowel sounds  normal; no masses,  no organomegaly Extremities- no edema Lymph-no adenopathy palpable Musculoskeletal-no joint tenderness, deformity or swelling Skin-warm and dry, no hyperpigmentation, vitiligo, or suspicious lesions  Neurological Examination   Mental Status: Alert, oriented, thought content appropriate.  Speech fluent without evidence of aphasia.  Able to follow 3 step commands without difficulty. Cranial Nerves: II: Discs flat bilaterally; Visual fields grossly normal, pupils equal, round, reactive to light and accommodation III,IV, VI: ptosis not present, extra-ocular motions intact bilaterally V,VII: smile symmetric, facial light touch sensation normal bilaterally VIII: hearing normal bilaterally IX,X: gag reflex present XI: bilateral shoulder shrug XII: midline tongue extension Motor: Right : Upper extremity   5/5    Left:     Upper extremity   5/5  Lower extremity   5/5     Lower extremity   5/5 Tone and bulk:normal tone throughout; no atrophy noted Sensory: Pinprick and  light touch intact throughout, bilaterally Deep Tendon Reflexes: 2+ and symmetric throughout Plantars: Right: downgoing   Left: downgoing Cerebellar: Normal finger-to-nose testing.  Dysmetria with heel-to-shin testing on the left Gait: not tested due to safety concerns   Laboratory Studies:  Basic Metabolic Panel: No results for input(s): NA, K, CL, CO2, GLUCOSE, BUN, CREATININE, CALCIUM, MG, PHOS in the last 168 hours.  Liver Function Tests: No results for input(s): AST, ALT, ALKPHOS, BILITOT, PROT, ALBUMIN in the last 168 hours. No results for input(s): LIPASE, AMYLASE in the last 168 hours. No results for input(s): AMMONIA in the last 168 hours.  CBC: No results for input(s): WBC, NEUTROABS, HGB, HCT, MCV, PLT in the last 168 hours.  Cardiac Enzymes: No results for input(s): CKTOTAL, CKMB, CKMBINDEX, TROPONINI in the last 168 hours.  BNP: Invalid input(s): POCBNP  CBG: No results for  input(s): GLUCAP in the last 168 hours.  Microbiology: Results for orders placed or performed in visit on 12/21/16  Urine culture     Status: None   Collection Time: 12/21/16  4:00 PM  Result Value Ref Range Status   Urine Culture, Routine Final report  Final   Organism ID, Bacteria Comment  Final    Comment: Mixed urogenital flora Less than 10,000 colonies/mL     Coagulation Studies: No results for input(s): LABPROT, INR in the last 72 hours.  Urinalysis: No results for input(s): COLORURINE, LABSPEC, PHURINE, GLUCOSEU, HGBUR, BILIRUBINUR, KETONESUR, PROTEINUR, UROBILINOGEN, NITRITE, LEUKOCYTESUR in the last 168 hours.  Invalid input(s): APPERANCEUR  Lipid Panel:    Component Value Date/Time   CHOL 155 02/16/2017 0713   CHOL 181 01/16/2016 1135   TRIG 139 02/16/2017 0713   HDL 48 02/16/2017 0713   HDL 63 01/16/2016 1135   CHOLHDL 3.2 02/16/2017 0713   VLDL 28 02/16/2017 0713   LDLCALC 79 02/16/2017 0713   LDLCALC 95 01/16/2016 1135    HgbA1C:  Lab Results  Component Value Date   HGBA1C 8.8 (H) 02/16/2017    Urine Drug Screen:  No results found for: LABOPIA, COCAINSCRNUR, LABBENZ, AMPHETMU, THCU, LABBARB  Alcohol Level: No results for input(s): ETH in the last 168 hours.   Imaging: Ct Head Wo Contrast  Result Date: 04/09/2017 CLINICAL DATA:  82 year old female with dizziness acute onset today. Patient reports similar sensation prior to her last stroke. EXAM: CT HEAD WITHOUT CONTRAST TECHNIQUE: Contiguous axial images were obtained from the base of the skull through the vertex without intravenous contrast. COMPARISON:  Brain MRI 02/16/2017 and earlier. FINDINGS: Brain: Patchy and confluent bilateral cerebral white matter hypodensity appears stable. Mild thalamic hypodensity has developed on the right corresponding to the right thalamic lacune diagnosed by MRI in April. No acute intracranial hemorrhage identified. No midline shift, mass effect, or evidence of intracranial  mass lesion. No cortically based acute infarct identified. No ventriculomegaly. ASPECTS 10. Vascular: Calcified atherosclerosis at the skull base. Dominant appearing distal right vertebral artery No suspicious intracranial vascular hyperdensity. Skull: No acute osseous abnormality identified. Sinuses/Orbits: Visualized paranasal sinuses and mastoids are stable and well pneumatized. Other: No acute orbit or scalp soft tissue findings. IMPRESSION: 1. No acute intracranial hemorrhage or acute cortically based infarct. 2. ASPECTS 10. 3. Expected evolution of the right thalamic lacune since April. Underlying chronic white matter disease. Electronically Signed   By: Genevie Ann M.D.   On: 04/09/2017 12:22    Assessment: 82 y.o. female with recent history of right thalamic infarct who presents with headache and worsening LLE symptoms.  Patient currently appears to be at baseline.  Head CT reviewed and shows no acute changes.  Patient on ASA and Plavix at home.  Recent work up revealed a normal echocardiogram with EF of 60%  Carotid dopplers show 50-60% stenosis of the subclavian artery.  No hemodynamically significant stenosis in the carotids.  A1c 8.8, LDL 79.  Patient on a statin.    Stroke Risk Factors - diabetes mellitus, hyperlipidemia and hypertension  Plan: 1. Prophylactic therapy-Continue ASA and Plavix 2. Frequent neuro checks 3. MRI of the brain without contrast.  If no acute changes noted no further work up recommended.  Holter and EEG may be considered on an outpatient basis.     Case discussed with Dr. Corky Downs.    Alexis Goodell, MD Neurology [phone] 04/09/2017, 12:32 PM

## 2017-04-09 NOTE — ED Provider Notes (Signed)
[facility] Emergency Department Provider Note   ____________________________________________    I have reviewed the triage vital signs and the nursing notes.   HISTORY  Chief Complaint Code Stroke     HPI Jill Hudson is a 82 y.o. female with a prior history of stroke presents today with completes of left leg weakness. Patient reports when she got up a little bit after 9:30 AM she felt that her left leg was weak but thought she might just be dizzy which happens to her frequently. She called her son and her doctor who recommended she come to the emergency department. She denies visual changes. No headache. No other neuro deficits reported. Recent history of CVA presented similarly to today. No nausea or vomiting. Reports compliance with her medications   Past Medical History:  Diagnosis Date  . Anxiety   . Arthritis   . Diabetes mellitus without complication ([place])   . Hyperlipidemia   . Hypertension   . Mitral and aortic valve disease   . Occasional tremors   . Shingles   . UTI (urinary tract infection)   . Vascular disease, peripheral [facility])     Patient Active Problem List   Diagnosis Date Noted  . CVA (cerebral vascular accident) ([place]) 02/15/2017  . Degenerative disc disease, lumbar 12/21/2016  . Atherosclerosis of native arteries of extremity with intermittent claudication ([place]) 12/20/2016  . Bilateral carotid artery stenosis 12/20/2016  . Hip bursitis 05/18/2016  . Elevated TSH 01/18/2016  . Chronic renal insufficiency, stage III (moderate) 01/16/2016  . Senile ecchymosis 01/16/2016  . Uncontrolled type 2 diabetes mellitus with diabetic polyneuropathy, with long-term current use of insulin ([place]) 09/16/2015  . GERD (gastroesophageal reflux disease) 07/24/2015  . Peripheral blood vessel disorder ([place]) 05/17/2015  . Neoplasm of uncertain behavior of skin 05/17/2015  . Background retinopathy due to secondary diabetes ([place]) 04/11/2015  .  Diabetes mellitus with polyneuropathy ([place]) 04/11/2015  . HLD (hyperlipidemia) 04/11/2015  . Essential (primary) hypertension 04/11/2015  . Generalized OA 04/11/2015  . Arterial insufficiency ([place]) 04/11/2015  . Arteriosclerosis of coronary artery 05/29/2013  . Hypertensive heart disease without CHF 05/29/2013    Past Surgical History:  Procedure Laterality Date  . CARDIAC CATHETERIZATION  1998   40% LM, 95% Ramus interm  . PTCA  08/2013   Left common iliac  . PTCA  12/2012   left ext iliac    Prior to Admission medications   Medication Sig Start Date End Date Taking? Authorizing Provider  Acetaminophen (TYLENOL PO) Take by mouth. Takes one 650 mg tablet + one 500 ng tablet at bedtime   Yes [provider]  aspirin 81 MG tablet Take 1 tablet by mouth daily.   Yes [provider]  B Complex Vitamins (VITAMIN-B COMPLEX) TABS Take 1 tablet by mouth daily.    Yes [provider]  B-D ULTRAFINE III SHORT PEN 31G X 8 MM MISC USE AS DIRECTED( APPLY BY DOSE NOT DAILY) 02/09/17  Yes [name], MD  Biotin 10 MG TABS Take 1 tablet by mouth daily.    Yes [provider]  ciprofloxacin (CIPRO) 250 MG tablet Take 250 mg by mouth 2 (two) times daily.   Yes [provider]  clopidogrel (PLAVIX) 75 MG tablet Take 1 tablet by mouth daily.   Yes [provider]  Cyanocobalamin (RA VITAMIN B-12 TR) 1000 MCG TBCR Take 1 tablet by mouth daily.    Yes [provider]  diltiazem Geronimo Boot  XT) 180 MG 24 hr capsule Take 1 tablet by mouth 2 (two) times daily.   Yes [provider]  Insulin Glargine (BASAGLAR KWIKPEN [place]) Inject 12 Units into the skin 2 (two) times daily at 10 AM and 5 PM.   Yes [provider]  loratadine (CLARITIN) 10 MG tablet Take 10 mg by mouth daily.   Yes [provider]  Multiple Vitamin (MULTI-VITAMINS) TABS Take 1 tablet by mouth daily.    Yes [provider]  Multiple  Vitamins-Minerals (PRESERVISION AREDS PO) Take 1 tablet by mouth 2 (two) times daily.   Yes [provider]  nystatin-triamcinolone (MYCOLOG II) cream Apply 1 application topically 2 (two) times daily. 12/21/16  Yes [name], MD  Omega 3 1000 MG CAPS Take 1 capsule by mouth daily.  12/12/09  Yes [provider]  rosuvastatin (CRESTOR) 10 MG tablet Take 10 mg by mouth daily. 04/06/17  Yes [provider]  traMADol (ULTRAM) 50 MG tablet Take 50 mg by mouth every 6 (six) hours as needed.  09/16/16  Yes [provider]  atorvastatin (LIPITOR) 40 MG tablet Take 1 tablet (40 mg total) by mouth daily. Patient not taking: Reported on 04/09/2017 02/16/17   Henreitta Leber, MD  gabapentin (NEURONTIN) 100 MG capsule Take 100 mg by mouth at bedtime as needed.  09/16/16 11/15/16  [provider]  metFORMIN (GLUCOPHAGE-XR) 500 MG 24 hr tablet TAKE 1 TABLET BY MOUTH TWICE DAILY Patient not taking: Reported on 04/09/2017 12/25/16   [name], MD     Allergies Saxagliptin; Codeine; Epinephrine; Ezetimibe; Nitrofurantoin; Limonene; and Sulfa antibiotics  Family History  Problem Relation Age of Onset  . Dementia Mother   . Diabetes Father     Social History Social History  Substance Use Topics  . Smoking status: Former Research scientist (life sciences)  . Smokeless tobacco: Never Used  . Alcohol use No    Review of Systems  Constitutional: No fever/chills Eyes: No visual changes.  ENT: No sore throat. Cardiovascular: Denies chest pain. Respiratory: Denies shortness of breath. Gastrointestinal:  No nausea, no vomiting.   Genitourinary: Negative for dysuria. Musculoskeletal: Negative for back pain. Skin: Negative for rash. Neurological: Negative for headaches    ____________________________________________   PHYSICAL EXAM:  VITAL SIGNS: ED Triage Vitals  Enc Vitals Group     BP 04/09/17 1131 (!) 169/52     Pulse --      Resp --      Temp --      Temp src  --      SpO2 --      Weight 04/09/17 1130 52.6 kg (116 lb)     Height 04/09/17 1130 1.562 m (5' 1.5")     Head Circumference --      Peak Flow --      Pain Score --      Pain Loc --      Pain Edu? --      Excl. in [place]? --     Constitutional: Alert and oriented. No acute distress. Pleasant and interactive Eyes: Conjunctivae are normal.  Head: Atraumatic. Nose: No congestion/rhinnorhea. Mouth/Throat: Mucous membranes are moist.    Cardiovascular: Normal rate, regular rhythm. Grossly normal heart sounds.  Good peripheral circulation. Respiratory: Normal respiratory effort.  No retractions. Lungs CTAB. Gastrointestinal: Soft and nontender. No distention.  No CVA tenderness. Genitourinary: deferred Musculoskeletal:  Warm and well perfused Neurologic:  Normal speech and language. PERRLA, EOMI, cranial nerves II through XII normal.  Slight weakness in the left leg although she is able to hold it up against gravity Skin:  Skin is warm, dry and intact. No rash noted. Psychiatric: Mood and affect are normal. Speech and behavior are normal.  ____________________________________________   LABS (all labs ordered are listed, but only abnormal results are displayed)  Labs Reviewed  BASIC METABOLIC PANEL - Abnormal; Notable for the following:       Result Value   Glucose, Bld 158 (*)    BUN 29 (*)    GFR calc non Af Amer 54 (*)    All other components within normal limits  CBC - Abnormal; Notable for the following:    RBC 3.60 (*)    Hemoglobin 10.6 (*)    HCT 31.5 (*)    All other components within normal limits  APTT  TROPONIN I  PROTIME-INR  URINALYSIS, COMPLETE (UACMP) WITH MICROSCOPIC  RAPID URINE DRUG SCREEN, HOSP PERFORMED  URINALYSIS, ROUTINE W REFLEX MICROSCOPIC  CBG MONITORING, ED   ____________________________________________  EKG  ED ECG REPORT I, Lavonia Drafts, the attending physician, personally viewed and interpreted this ECG.  Date: 04/09/2017  Rhythm:  normal sinus rhythm QRS Axis: normal Intervals: normal ST/T Wave abnormalities: normal Narrative Interpretation: unremarkable  ____________________________________________  RADIOLOGY  CT head unremarkable MRI brain no acute distress ____________________________________________   PROCEDURES  Procedure(s) performed: No    Critical Care performed: No ____________________________________________   INITIAL IMPRESSION / ASSESSMENT AND PLAN / ED COURSE  Pertinent labs & imaging results that were available during my care of the patient were reviewed by me and considered in my medical decision making (see chart for details).  Patient with new onset left leg weakness which appears to be within  code stroke window, not clear if this was present when she awoke. code stroke called  Patient seen by neurologist Dr. Doy Mince who notes the patient is not a TPA candidate given very mild symptoms and recent CVA within 3 months. She recommends MRI brain and if unremarkable okay for discharge with no change in medications  ----------------------------------------- 3:19 PM on 04/09/2017 -----------------------------------------  MRI brain unremarkable, patient appropriate for discharge at this time based on neurology recommendations, patient agrees to this plan.    ____________________________________________   FINAL CLINICAL IMPRESSION(S) / ED DIAGNOSES  Final diagnoses:  Weakness      NEW MEDICATIONS STARTED DURING THIS VISIT:  New Prescriptions   No medications on file     Note:  This document was prepared using Dragon voice recognition software and may include unintentional dictation errors.    Lavonia Drafts, MD 04/09/17 [phone]

## 2017-04-09 NOTE — Discharge Instructions (Signed)
You had an MRI today which did not show a stroke. Please follow up with your doctor

## 2017-04-09 NOTE — Progress Notes (Signed)
Chaplain responded to a page for code stroke for a pt in ED Rm 14. CH met with Pt, medical team was evaluating pt. Pt was alert and responsive, but no family member was present. After the medical team completed their assessment, [place] talked to pt. Pt stated that her daughter was at work and was aware that pt was coming to hospital. Pt also mentioned that the nurse had taken her daughter's phone number and was going to call her daughter and inform her that pt is in hospital. [facility] provided a ministry of encouragement and presence. [place] to follow up with pt as needed.

## 2017-04-12 DIAGNOSIS — Z794 Long term (current) use of insulin: Secondary | ICD-10-CM | POA: Diagnosis not present

## 2017-04-12 DIAGNOSIS — E1165 Type 2 diabetes mellitus with hyperglycemia: Secondary | ICD-10-CM | POA: Diagnosis not present

## 2017-04-26 ENCOUNTER — Other Ambulatory Visit: Payer: Self-pay | Admitting: Internal Medicine

## 2017-05-05 ENCOUNTER — Ambulatory Visit (INDEPENDENT_AMBULATORY_CARE_PROVIDER_SITE_OTHER): Payer: Medicare Other | Admitting: Internal Medicine

## 2017-05-05 ENCOUNTER — Encounter: Payer: Self-pay | Admitting: Internal Medicine

## 2017-05-05 VITALS — BP 138/44 | HR 55 | Ht 61.0 in | Wt 117.0 lb

## 2017-05-05 DIAGNOSIS — E782 Mixed hyperlipidemia: Secondary | ICD-10-CM

## 2017-05-05 DIAGNOSIS — I639 Cerebral infarction, unspecified: Secondary | ICD-10-CM | POA: Diagnosis not present

## 2017-05-05 DIAGNOSIS — T466X5A Adverse effect of antihyperlipidemic and antiarteriosclerotic drugs, initial encounter: Secondary | ICD-10-CM | POA: Diagnosis not present

## 2017-05-05 DIAGNOSIS — M791 Myalgia, unspecified site: Secondary | ICD-10-CM | POA: Insufficient documentation

## 2017-05-05 DIAGNOSIS — G5603 Carpal tunnel syndrome, bilateral upper limbs: Secondary | ICD-10-CM

## 2017-05-05 NOTE — Progress Notes (Signed)
Date:  05/05/2017   Name:  Jill Hudson   DOB:  January 12, 1935   MRN:  [id number]   Chief Complaint: Muscle Pain Last month she was seen by Cardiology PA who changed her to Crestor 10 mg.  Since then has been having increasing muscle soreness.  She does not know why it was changed.  Her statin history:  Was on pravastatin but after hospital stay earlier this year, she was was changed to atorvastatin 40 mg.  She believes that she tolerated atorvastatin.   Muscle Pain  This is a new problem. The current episode started 1 to 4 weeks ago. The problem occurs daily. The problem is unchanged. Associated with: change from atorvastatin to rosuvastatin. The pain is present in the right arm, left arm, right upper leg and left upper leg. The pain is mild. The symptoms are aggravated by any movement and exercise. Pertinent negatives include no chest pain, fever, joint swelling, nausea, rash, urinary symptoms or weakness. Past treatments include nothing.   Numbness in fingers - over the past few weeks has intermittent tingling in the first and second fingers of both hands.  Worse on the right.  Often awakens her at night.  Worse after mopping or sweeping.  May have had injections 10+ years ago for CTS.   Review of Systems  Constitutional: Negative for fever.  Eyes: Negative for visual disturbance.  Respiratory: Negative for choking and chest tightness.   Cardiovascular: Positive for leg swelling. Negative for chest pain and palpitations.  Gastrointestinal: Negative for nausea.  Musculoskeletal: Positive for myalgias. Negative for joint swelling.  Skin: Negative for rash.  Neurological: Positive for numbness. Negative for tremors and weakness.    Patient Active Problem List   Diagnosis Date Noted  . CVA (cerebral vascular accident) ([place]) 02/15/2017  . Degenerative disc disease, lumbar 12/21/2016  . Atherosclerosis of native arteries of extremity with intermittent claudication ([place]) 12/20/2016  .  Bilateral carotid artery stenosis 12/20/2016  . Hip bursitis 05/18/2016  . Elevated TSH 01/18/2016  . Chronic renal insufficiency, stage III (moderate) 01/16/2016  . Senile ecchymosis 01/16/2016  . Uncontrolled type 2 diabetes mellitus with diabetic polyneuropathy, with long-term current use of insulin ([place]) 09/16/2015  . GERD (gastroesophageal reflux disease) 07/24/2015  . Peripheral blood vessel disorder ([place]) 05/17/2015  . Neoplasm of uncertain behavior of skin 05/17/2015  . Background retinopathy due to secondary diabetes ([place]) 04/11/2015  . Diabetes mellitus with polyneuropathy ([place]) 04/11/2015  . HLD (hyperlipidemia) 04/11/2015  . Essential (primary) hypertension 04/11/2015  . Generalized OA 04/11/2015  . Arterial insufficiency ([place]) 04/11/2015  . Arteriosclerosis of coronary artery 05/29/2013  . Hypertensive heart disease without CHF 05/29/2013    Prior to Admission medications   Medication Sig Start Date End Date Taking? Authorizing Provider  Acetaminophen (TYLENOL PO) Take by mouth. Takes one 650 mg tablet + one 500 ng tablet at bedtime   Yes [provider]  aspirin 81 MG tablet Take 1 tablet by mouth daily.   Yes [provider]  B Complex Vitamins (VITAMIN-B COMPLEX) TABS Take 1 tablet by mouth daily.    Yes [provider]  B-D ULTRAFINE III SHORT PEN 31G X 8 MM MISC USE AS DIRECTED( APPLY BY DOSE NOT DAILY) 02/09/17  Yes [name], MD  Biotin 10 MG TABS Take 1 tablet by mouth daily.    Yes [provider]  clopidogrel (PLAVIX) 75 MG tablet Take 1 tablet by mouth daily.   Yes [provider]  Cyanocobalamin (RA VITAMIN B-12 TR) 1000 MCG TBCR Take 1 tablet by mouth daily.    Yes [provider]  diltiazem (CARTIA XT) 180 MG 24 hr capsule Take 1 tablet by mouth 2 (two) times daily.   Yes [provider]  Insulin Glargine (BASAGLAR KWIKPEN [place]) Inject 12 Units into the skin 2 (two) times daily at 10 AM and 5  PM.   Yes [provider]  loratadine (CLARITIN) 10 MG tablet Take 10 mg by mouth daily.   Yes [provider]  losartan (COZAAR) 50 MG tablet Take 25 mg by mouth 2 (two) times daily. 04/06/17 04/06/18 Yes [provider]  metFORMIN (GLUCOPHAGE-XR) 500 MG 24 hr tablet TAKE 1 TABLET BY MOUTH TWICE DAILY 04/26/17  Yes [name], MD  Multiple Vitamin (MULTI-VITAMINS) TABS Take 1 tablet by mouth daily.    Yes [provider]  Multiple Vitamins-Minerals (PRESERVISION AREDS PO) Take 1 tablet by mouth 2 (two) times daily.   Yes [provider]  nystatin-triamcinolone (MYCOLOG II) cream Apply 1 application topically 2 (two) times daily. 12/21/16  Yes [name], MD  Omega 3 1000 MG CAPS Take 1 capsule by mouth daily.  12/12/09  Yes [provider]  rosuvastatin (CRESTOR) 10 MG tablet Take 10 mg by mouth daily. 04/06/17  Yes [provider]  traMADol (ULTRAM) 50 MG tablet Take 50 mg by mouth every 6 (six) hours as needed.  09/16/16  Yes [provider]  gabapentin (NEURONTIN) 100 MG capsule Take 100 mg by mouth at bedtime as needed.  09/16/16 11/15/16  [provider]    Allergies  Allergen Reactions  . Saxagliptin Diarrhea  . Codeine     Upset stomach  . Epinephrine   . Ezetimibe Other (See Comments)    myalgias  . Nitrofurantoin     Pruitus, rash  . Limonene Rash  . Sulfa Antibiotics Rash and Other (See Comments)    Sore mouth     Past Surgical History:  Procedure Laterality Date  . CARDIAC CATHETERIZATION  1998   40% LM, 95% Ramus interm  . PTCA  08/2013   Left common iliac  . PTCA  12/2012   left ext iliac    Social History  Substance Use Topics  . Smoking status: Former Research scientist (life sciences)  . Smokeless tobacco: Never Used  . Alcohol use No     Medication list has been reviewed and updated.   Physical Exam  Constitutional: She is oriented to person, place, and time. She appears well-developed. No  distress.  HENT:  Head: Normocephalic and atraumatic.  Neck: Normal range of motion. Neck supple.  Cardiovascular: Normal rate, regular rhythm and normal heart sounds.   Pulmonary/Chest: Effort normal and breath sounds normal. No respiratory distress. She has no wheezes.  Musculoskeletal: Normal range of motion.  Tenderness of upper arms and quadriceps muscles bilaterally - no mass or warmth.  Neurological: She is alert and oriented to person, place, and time. She has normal strength.  Positive Tinel's on right Positive Phalen's on left  Skin: Skin is warm and dry. No rash noted.  Psychiatric: She has a normal mood and affect. Her speech is normal and behavior is normal. Thought content normal.  Nursing note and vitals reviewed.   BP (!) 138/44   Pulse (!) 55   Ht 5\' 1"  (1.549 m)   Wt 117 lb (53.1 kg)   SpO2 98%   BMI 22.11 kg/m   Assessment and  Plan: 1. Mixed hyperlipidemia Now on Crestor with sx  2. Carpal tunnel syndrome, bilateral Recommend avoiding repetitive wrist motion Wear splint while sleeping May need referral to Hand Orthopedist  3. Myalgia due to HMG CoA reductase inhibitor Stop Crestor After 1-2 weeks if sx resolve, call for refill on Atorvastatin 40 mg. Return for labs in one month   No orders of the defined types were placed in this encounter.   [name], MD [facility]  05/05/2017

## 2017-05-07 ENCOUNTER — Ambulatory Visit: Payer: Medicare Other | Admitting: Internal Medicine

## 2017-05-12 ENCOUNTER — Ambulatory Visit: Payer: Self-pay | Admitting: Internal Medicine

## 2017-05-12 ENCOUNTER — Other Ambulatory Visit: Payer: Medicare Other

## 2017-05-25 ENCOUNTER — Other Ambulatory Visit (INDEPENDENT_AMBULATORY_CARE_PROVIDER_SITE_OTHER): Payer: Self-pay | Admitting: Vascular Surgery

## 2017-06-02 ENCOUNTER — Ambulatory Visit (INDEPENDENT_AMBULATORY_CARE_PROVIDER_SITE_OTHER): Payer: Medicare Other | Admitting: Internal Medicine

## 2017-06-02 ENCOUNTER — Encounter: Payer: Self-pay | Admitting: Internal Medicine

## 2017-06-02 VITALS — BP 102/60 | HR 54 | Ht 61.5 in | Wt 116.8 lb

## 2017-06-02 DIAGNOSIS — Z1231 Encounter for screening mammogram for malignant neoplasm of breast: Secondary | ICD-10-CM

## 2017-06-02 DIAGNOSIS — I739 Peripheral vascular disease, unspecified: Secondary | ICD-10-CM | POA: Diagnosis not present

## 2017-06-02 DIAGNOSIS — N183 Chronic kidney disease, stage 3 unspecified: Secondary | ICD-10-CM

## 2017-06-02 DIAGNOSIS — I70213 Atherosclerosis of native arteries of extremities with intermittent claudication, bilateral legs: Secondary | ICD-10-CM | POA: Diagnosis not present

## 2017-06-02 DIAGNOSIS — Z794 Long term (current) use of insulin: Secondary | ICD-10-CM

## 2017-06-02 DIAGNOSIS — I639 Cerebral infarction, unspecified: Secondary | ICD-10-CM

## 2017-06-02 DIAGNOSIS — E1165 Type 2 diabetes mellitus with hyperglycemia: Secondary | ICD-10-CM | POA: Diagnosis not present

## 2017-06-02 DIAGNOSIS — Z0000 Encounter for general adult medical examination without abnormal findings: Secondary | ICD-10-CM

## 2017-06-02 DIAGNOSIS — E1142 Type 2 diabetes mellitus with diabetic polyneuropathy: Secondary | ICD-10-CM | POA: Diagnosis not present

## 2017-06-02 DIAGNOSIS — I63331 Cerebral infarction due to thrombosis of right posterior cerebral artery: Secondary | ICD-10-CM

## 2017-06-02 DIAGNOSIS — I10 Essential (primary) hypertension: Secondary | ICD-10-CM | POA: Diagnosis not present

## 2017-06-02 DIAGNOSIS — IMO0002 Reserved for concepts with insufficient information to code with codable children: Secondary | ICD-10-CM

## 2017-06-02 LAB — POCT URINALYSIS DIPSTICK
Bilirubin, UA: NEGATIVE
Glucose, UA: NEGATIVE
Ketones, UA: NEGATIVE
Nitrite, UA: NEGATIVE
PH UA: 6 (ref 5.0–8.0)
RBC UA: NEGATIVE
Spec Grav, UA: <=1.005 — AB (ref 1.010–1.025)
UROBILINOGEN UA: 0.2 U/dL

## 2017-06-02 MED ORDER — ATORVASTATIN CALCIUM 40 MG PO TABS: 40.0000 mg | ORAL_TABLET | Freq: Every day | ORAL | 3 refills | Status: DC

## 2017-06-02 NOTE — Patient Instructions (Signed)
Health Maintenance  Topic Date Due  . OPHTHALMOLOGY EXAM  12/01/2016  . INFLUENZA VACCINE  07/26/2017 (Originally 05/26/2017)  . HEMOGLOBIN A1C  08/18/2017  . FOOT EXAM  09/23/2017  . TETANUS/TDAP  10/26/2018  . DEXA SCAN  Addressed  . PNA vac Low Risk Adult  Completed

## 2017-06-02 NOTE — Progress Notes (Signed)
Patient: Jill Hudson, Female    DOB: 06-17-1935, 82 y.o.   MRN: [id number] Visit Date: 06/02/2017  Today's Provider: [name], MD   Chief Complaint  Patient presents with  . Medicare Wellness   Subjective:    Annual wellness visit Jill Hudson is a 82 y.o. female who presents today for her Subsequent Annual Wellness Visit. She feels fairly well. She reports exercising very little due to leg heaviness. She reports she is sleeping well. She denies breast problems - is due for a mammogram.  ----------------------------------------------------------- Diabetes  She has type 2 diabetes mellitus. Her disease course has been stable. Pertinent negatives for hypoglycemia include no dizziness or headaches. Associated symptoms include fatigue, foot paresthesias and weakness. Pertinent negatives for diabetes include no chest pain, no polydipsia and no polyuria. There are no hypoglycemic complications. Current diabetic treatment includes insulin injections. She is compliant with treatment all of the time. She is following a generally healthy diet. Prior visit with dietitian: followed by Endocrinology. An ACE inhibitor/angiotensin II receptor blocker is being taken.  Hyperlipidemia  This is a chronic problem. Pertinent negatives include no chest pain or shortness of breath. (Arm pain - did not improve with stopping statin therapy) She is currently on no antihyperlipidemic treatment. The current treatment provides significant (when last checked LDL 79) improvement of lipids. Compliance problems include medication side effects.   Hypertension  This is a chronic problem. The problem is controlled. Pertinent negatives include no chest pain, headaches, palpitations or shortness of breath.  PVD/claudication - has worse sx in left leg.  Hard to exercise due to heaviness. Stopped statin recently with no change in myalgia of arms.  Will need to be back on statins or possibly PCSK-9 agent. CVA - recent stroke  in April.  Having some left leg spasms likely as a result.  No new focal neurological sx.  Taking Plavix and Aspirin.  Review of Systems  Constitutional: Positive for fatigue. Negative for chills, fever and unexpected weight change.  HENT: Negative for trouble swallowing.   Eyes: Negative for visual disturbance.  Respiratory: Negative for chest tightness, shortness of breath and wheezing.   Cardiovascular: Negative for chest pain, palpitations and leg swelling.  Gastrointestinal: Negative for abdominal pain, anal bleeding, constipation, diarrhea and vomiting.  Endocrine: Negative for polydipsia and polyuria.  Genitourinary: Negative for dysuria, frequency, hematuria and urgency.  Musculoskeletal: Positive for back pain (lumbar - attributed to OA).  Skin: Negative for color change, rash and wound.  Neurological: Positive for weakness and numbness. Negative for dizziness and headaches.  Psychiatric/Behavioral: Negative for dysphoric mood and sleep disturbance.    Social History   Social History  . Marital status: Married    Spouse name: N/A  . Number of children: N/A  . Years of education: N/A   Occupational History  . Not on file.   Social History Main Topics  . Smoking status: Former Research scientist (life sciences)  . Smokeless tobacco: Never Used  . Alcohol use No  . Drug use: No  . Sexual activity: Not on file   Other Topics Concern  . Not on file   Social History Narrative  . No narrative on file    Patient Active Problem List   Diagnosis Date Noted  . Carpal tunnel syndrome, bilateral 05/05/2017  . Myalgia due to HMG CoA reductase inhibitor 05/05/2017  . CVA (cerebral vascular accident) ([place]) 02/15/2017  . Degenerative disc disease, lumbar 12/21/2016  . Atherosclerosis of native arteries of extremity with intermittent claudication (  [place]) 12/20/2016  . Bilateral carotid artery stenosis 12/20/2016  . Hip bursitis 05/18/2016  . Elevated TSH 01/18/2016  . Chronic renal insufficiency, stage  III (moderate) 01/16/2016  . Senile ecchymosis 01/16/2016  . Uncontrolled type 2 diabetes mellitus with diabetic polyneuropathy, with long-term current use of insulin ([place]) 09/16/2015  . GERD (gastroesophageal reflux disease) 07/24/2015  . Peripheral blood vessel disorder ([place]) 05/17/2015  . Neoplasm of uncertain behavior of skin 05/17/2015  . Background retinopathy due to secondary diabetes ([place]) 04/11/2015  . Diabetes mellitus with polyneuropathy ([place]) 04/11/2015  . Mixed hyperlipidemia 04/11/2015  . Essential (primary) hypertension 04/11/2015  . Generalized OA 04/11/2015  . Arterial insufficiency ([place]) 04/11/2015  . Arteriosclerosis of coronary artery 05/29/2013  . Hypertensive heart disease without CHF 05/29/2013    Past Surgical History:  Procedure Laterality Date  . CARDIAC CATHETERIZATION  1998   40% LM, 95% Ramus interm  . PTCA  08/2013   Left common iliac  . PTCA  12/2012   left ext iliac    Her family history includes Dementia in her mother; Diabetes in her father.     Previous Medications   ACETAMINOPHEN (TYLENOL PO)    Take by mouth. Takes one 650 mg tablet + one 500 ng tablet at bedtime   ASPIRIN 81 MG TABLET    Take 1 tablet by mouth daily.   B COMPLEX VITAMINS (VITAMIN-B COMPLEX) TABS    Take 1 tablet by mouth daily.    B-D ULTRAFINE III SHORT PEN 31G X 8 MM MISC    USE AS DIRECTED( APPLY BY DOSE NOT DAILY)   BIOTIN 10 MG TABS    Take 1 tablet by mouth daily.    CLOPIDOGREL (PLAVIX) 75 MG TABLET    TAKE 1 TABLET BY MOUTH EVERY DAY   CYANOCOBALAMIN (RA VITAMIN B-12 TR) 1000 MCG TBCR    Take 1 tablet by mouth daily.    DILTIAZEM (CARTIA XT) 180 MG 24 HR CAPSULE    Take 1 tablet by mouth 2 (two) times daily.   INSULIN GLARGINE (BASAGLAR KWIKPEN [place])    Inject 12 Units into the skin 2 (two) times daily at 10 AM and 5 PM.   LORATADINE (CLARITIN) 10 MG TABLET    Take 10 mg by mouth daily.   LOSARTAN (COZAAR) 50 MG TABLET    Take 25 mg by mouth 2 (two) times daily.    METFORMIN (GLUCOPHAGE-XR) 500 MG 24 HR TABLET    TAKE 1 TABLET BY MOUTH TWICE DAILY   MULTIPLE VITAMINS-MINERALS (PRESERVISION AREDS PO)    Take 1 tablet by mouth 2 (two) times daily.   NYSTATIN-TRIAMCINOLONE (MYCOLOG II) CREAM    Apply 1 application topically 2 (two) times daily.   OMEGA 3 1000 MG CAPS    Take 1 capsule by mouth daily.    TRAMADOL (ULTRAM) 50 MG TABLET    Take 50 mg by mouth every 6 (six) hours as needed.     Patient Care Team: [name], MD as PCP - General (Family Medicine) Janna Arch (Cardiology) Schnier, Dolores Lory, MD (Vascular Surgery) Abisogun, Domenica Reamer, MD as Referring Physician (Endocrinology)      Objective:   Vitals: BP 102/60   Pulse (!) 54   Ht 5' 1.5" (1.562 m)   Wt 116 lb 12.8 oz (53 kg)   SpO2 99%   BMI 21.71 kg/m   Physical Exam  Constitutional: She is oriented to person, place, and time. She appears well-developed and well-nourished. No  distress.  HENT:  Head: Normocephalic and atraumatic.  Neck: Normal range of motion. Neck supple. Carotid bruit is not present. No thyromegaly present.  Cardiovascular: Normal rate, regular rhythm and normal heart sounds.   No murmur heard. Pulmonary/Chest: Effort normal and breath sounds normal. No respiratory distress. She has no wheezes. She has no rales.  Abdominal: Soft. Normal appearance and bowel sounds are normal. There is no tenderness.  Musculoskeletal:       Right hip: She exhibits no tenderness.       Left hip: She exhibits no tenderness.       Lumbar back: She exhibits decreased range of motion and tenderness.  Neurological: She is alert and oriented to person, place, and time. A sensory deficit is present. She exhibits abnormal muscle tone (slight increased tone in LLE). Gait normal.  Skin: Skin is warm, dry and intact. No rash noted.  Psychiatric: She has a normal mood and affect. Her speech is normal and behavior is normal. Judgment and thought content normal. Cognition and  memory are normal.  Nursing note and vitals reviewed.   Activities of Daily Living In your present state of health, do you have any difficulty performing the following activities: 06/02/2017 02/15/2017  Hearing? N N  Vision? N N  Difficulty concentrating or making decisions? N N  Walking or climbing stairs? N Y  Dressing or bathing? N Y  Doing errands, shopping? N N  Preparing Food and eating ? N -  Using the Toilet? N -  In the past six months, have you accidently leaked urine? N -  Do you have problems with loss of bowel control? N -  Managing your Medications? N -  Managing your Finances? N -  Housekeeping or managing your Housekeeping? N -  Some recent data might be hidden    Fall Risk Assessment Fall Risk  06/02/2017 12/09/2016 12/01/2016 06/26/2016 05/17/2015  Falls in the past year? No No - No No  Risk for fall due to : - - (No Data) - -  Risk for fall due to: Comment - - no falls since last visit - -      Depression Screen PHQ 2/9 Scores 06/02/2017 06/26/2016 05/17/2015  PHQ - 2 Score 1 0 0   6CIT Screen 06/02/2017  What Year? 0 points  What month? 0 points  What time? 0 points  Count back from 20 0 points  Months in reverse 0 points  Repeat phrase 0 points  Total Score 0    Medicare Annual Wellness Visit Summary:  Reviewed patient's Family Medical History Reviewed and updated list of patient's medical providers Assessment of cognitive impairment was done Assessed patient's functional ability Established a written schedule for health screening [place] Completed and Reviewed  Exercise Activities and Dietary recommendations Goals    None      Immunization History  Administered Date(s) Administered  . Influenza,inj,Quad PF,36+ Mos 09/16/2015, 09/23/2016  . Pneumococcal Conjugate-13 09/10/2014  . Pneumococcal Polysaccharide-23 10/29/2003  . Pneumococcal-Unspecified 09/04/2010  . Tdap 10/26/2008  . Zoster 05/07/2011    Health Maintenance    Topic Date Due  . OPHTHALMOLOGY EXAM  12/01/2016  . INFLUENZA VACCINE  07/26/2017 (Originally 05/26/2017)  . HEMOGLOBIN A1C  08/18/2017  . FOOT EXAM  09/23/2017  . TETANUS/TDAP  10/26/2018  . DEXA SCAN  Addressed  . PNA vac Low Risk Adult  Completed    Discussed health benefits of physical activity, and encouraged her to engage in regular exercise appropriate  for her age and condition.    ------------------------------------------------------------------------------------------------------------  Assessment & Plan:   1. Medicare annual wellness visit, subsequent Measures satisfied - POCT Urinalysis Dipstick  2. Essential (primary) hypertension controlled - CBC with Differential/Platelet  3. Peripheral blood vessel disorder (HCC) Continue plavix, aspirin, and exercise as tolerated  4. Cerebrovascular accident (CVA) due to thrombosis of right posterior cerebral artery (HCC) Will mild LLE spasiticity and weakness  5. Uncontrolled type 2 diabetes mellitus with diabetic polyneuropathy, with long-term current use of insulin ([place]) Followed by Endocrinology - Hepatic function panel - TSH  6. Chronic renal insufficiency, stage III (moderate) monitoring  7. Atherosclerosis of native artery of both lower extremities with intermittent claudication (HCC) Resume lipitor 40 mg Discuss PCSK-9 with cardiology at next visit - atorvastatin (LIPITOR) 40 MG tablet; Take 1 tablet (40 mg total) by mouth daily.  Dispense: 90 tablet; Refill: 3  8. Encounter for screening mammogram for breast cancer Schedule at [facility]; Future   Meds ordered this encounter  Medications  . atorvastatin (LIPITOR) 40 MG tablet    Sig: Take 1 tablet (40 mg total) by mouth daily.    Dispense:  90 tablet    Refill:  Esmeralda, MD [facility]  06/02/2017

## 2017-06-03 LAB — HEPATIC FUNCTION PANEL
ALK PHOS: 92 IU/L (ref 39–117)
ALT: 16 IU/L (ref 0–32)
AST: 19 IU/L (ref 0–40)
Albumin: 4.2 g/dL (ref 3.5–4.7)
BILIRUBIN TOTAL: 0.2 mg/dL (ref 0.0–1.2)
BILIRUBIN, DIRECT: 0.08 mg/dL (ref 0.00–0.40)
TOTAL PROTEIN: 6.5 g/dL (ref 6.0–8.5)

## 2017-06-03 LAB — CBC WITH DIFFERENTIAL/PLATELET
BASOS: 0 %
Basophils Absolute: 0 10*3/uL (ref 0.0–0.2)
EOS (ABSOLUTE): 0.3 10*3/uL (ref 0.0–0.4)
Eos: 3 %
HEMATOCRIT: 33.7 % — AB (ref 34.0–46.6)
Hemoglobin: 10.7 g/dL — ABNORMAL LOW (ref 11.1–15.9)
IMMATURE GRANULOCYTES: 0 %
Immature Grans (Abs): 0 10*3/uL (ref 0.0–0.1)
LYMPHS ABS: 1.3 10*3/uL (ref 0.7–3.1)
Lymphs: 16 %
MCH: 28.4 pg (ref 26.6–33.0)
MCHC: 31.8 g/dL (ref 31.5–35.7)
MCV: 89 fL (ref 79–97)
MONOCYTES: 7 %
MONOS ABS: 0.6 10*3/uL (ref 0.1–0.9)
Neutrophils Absolute: 5.7 10*3/uL (ref 1.4–7.0)
Neutrophils: 74 %
PLATELETS: 267 10*3/uL (ref 150–379)
RBC: 3.77 x10E6/uL (ref 3.77–5.28)
RDW: 14.1 % (ref 12.3–15.4)
WBC: 7.8 10*3/uL (ref 3.4–10.8)

## 2017-06-03 LAB — TSH: TSH: 5.2 u[IU]/mL — AB (ref 0.450–4.500)

## 2017-06-09 DIAGNOSIS — E113393 Type 2 diabetes mellitus with moderate nonproliferative diabetic retinopathy without macular edema, bilateral: Secondary | ICD-10-CM | POA: Diagnosis not present

## 2017-06-17 DIAGNOSIS — D3610 Benign neoplasm of peripheral nerves and autonomic nervous system, unspecified: Secondary | ICD-10-CM | POA: Diagnosis not present

## 2017-06-17 DIAGNOSIS — Z85828 Personal history of other malignant neoplasm of skin: Secondary | ICD-10-CM | POA: Diagnosis not present

## 2017-06-17 DIAGNOSIS — D485 Neoplasm of uncertain behavior of skin: Secondary | ICD-10-CM | POA: Diagnosis not present

## 2017-06-17 DIAGNOSIS — D2262 Melanocytic nevi of left upper limb, including shoulder: Secondary | ICD-10-CM | POA: Diagnosis not present

## 2017-06-17 DIAGNOSIS — D225 Melanocytic nevi of trunk: Secondary | ICD-10-CM | POA: Diagnosis not present

## 2017-06-24 LAB — HM DIABETES EYE EXAM

## 2017-06-25 ENCOUNTER — Encounter: Payer: Self-pay | Admitting: Internal Medicine

## 2017-07-01 ENCOUNTER — Telehealth: Payer: Self-pay

## 2017-07-01 ENCOUNTER — Encounter (INDEPENDENT_AMBULATORY_CARE_PROVIDER_SITE_OTHER): Payer: Self-pay | Admitting: Vascular Surgery

## 2017-07-01 ENCOUNTER — Ambulatory Visit (INDEPENDENT_AMBULATORY_CARE_PROVIDER_SITE_OTHER): Payer: Medicare Other | Admitting: Vascular Surgery

## 2017-07-01 VITALS — BP 154/54 | HR 59 | Resp 16 | Ht 61.0 in | Wt 118.0 lb

## 2017-07-01 DIAGNOSIS — I639 Cerebral infarction, unspecified: Secondary | ICD-10-CM | POA: Diagnosis not present

## 2017-07-01 DIAGNOSIS — I739 Peripheral vascular disease, unspecified: Secondary | ICD-10-CM

## 2017-07-01 DIAGNOSIS — M79604 Pain in right leg: Secondary | ICD-10-CM | POA: Diagnosis not present

## 2017-07-01 DIAGNOSIS — I63331 Cerebral infarction due to thrombosis of right posterior cerebral artery: Secondary | ICD-10-CM

## 2017-07-01 DIAGNOSIS — M79605 Pain in left leg: Secondary | ICD-10-CM | POA: Diagnosis not present

## 2017-07-01 DIAGNOSIS — E782 Mixed hyperlipidemia: Secondary | ICD-10-CM | POA: Diagnosis not present

## 2017-07-01 DIAGNOSIS — I6523 Occlusion and stenosis of bilateral carotid arteries: Secondary | ICD-10-CM

## 2017-07-01 DIAGNOSIS — I10 Essential (primary) hypertension: Secondary | ICD-10-CM | POA: Diagnosis not present

## 2017-07-01 NOTE — Telephone Encounter (Signed)
Patient wants refill  Losartan 50 mg prescribed by Dr.Abi who moved and she will not see new new doctor at [facility] till Oct. I instructed her to call and see who is doing refills for Burnard Bunting until seen by new doctor and call us back if can not get filled.

## 2017-07-02 ENCOUNTER — Other Ambulatory Visit (INDEPENDENT_AMBULATORY_CARE_PROVIDER_SITE_OTHER): Payer: Self-pay | Admitting: Vascular Surgery

## 2017-07-02 DIAGNOSIS — I70219 Atherosclerosis of native arteries of extremities with intermittent claudication, unspecified extremity: Secondary | ICD-10-CM

## 2017-07-02 NOTE — Telephone Encounter (Signed)
Reviewed. Awaiting pt call back.

## 2017-07-03 DIAGNOSIS — M79606 Pain in leg, unspecified: Secondary | ICD-10-CM | POA: Insufficient documentation

## 2017-07-03 NOTE — Progress Notes (Signed)
MRN : [id number]  Jill Hudson is a 82 y.o. (March 31, 1935) female who presents with chief complaint of  Chief Complaint  Patient presents with  . Follow-up    Left leg soreness  .  History of Present Illness: The patient is seen for evaluation of painful lower extremities. Patient notes the pain is variable and not always associated with activity.  The pain is somewhat consistent day to day occurring on most days. The patient notes the pain also occurs with standing and routinely seems worse as the day wears on. The pain has been progressive over the past several years. The patient states these symptoms are causing  a profound negative impact on quality of life and daily activities.  The patient denies rest pain or dangling of an extremity off the side of the bed during the night for relief. No open wounds or sores at this time. No history of DVT or phlebitis.  There is a  history of back problems and DJD of the lumbar and sacral spine.   The patient is also seen for follow up evaluation of carotid stenosis.   The patient denies amaurosis fugax. There is no recent history of TIA symptoms or focal motor deficits. There is a new CVA since her last visit from which she has largely recovered.  The patient is taking enteric-coated aspirin 81 mg daily.  There is no history of migraine headaches. There is no history of seizures.  The patient has a history of coronary artery disease, no recent episodes of angina or shortness of breath. There is a history of hyperlipidemia which is being treated with a statin.      Current Meds  Medication Sig  . Acetaminophen (TYLENOL PO) Take by mouth. Takes one 650 mg tablet + one 500 ng tablet at bedtime  . amoxicillin (AMOXIL) 500 MG capsule   . aspirin 81 MG tablet Take 1 tablet by mouth daily.  Marland Kitchen atorvastatin (LIPITOR) 40 MG tablet Take 1 tablet (40 mg total) by mouth daily.  . B Complex Vitamins (VITAMIN-B COMPLEX) TABS Take 1 tablet by mouth  daily.   . B-D ULTRAFINE III SHORT PEN 31G X 8 MM MISC USE AS DIRECTED( APPLY BY DOSE NOT DAILY)  . Biotin 10 MG TABS Take 1 tablet by mouth daily.   . clopidogrel (PLAVIX) 75 MG tablet TAKE 1 TABLET BY MOUTH EVERY DAY  . Cyanocobalamin (RA VITAMIN B-12 TR) 1000 MCG TBCR Take 1 tablet by mouth daily.   Marland Kitchen diltiazem (CARTIA XT) 180 MG 24 hr capsule Take 1 tablet by mouth 2 (two) times daily.  . Insulin Glargine (BASAGLAR KWIKPEN [place]) Inject 12 Units into the skin 2 (two) times daily at 10 AM and 5 PM.  . loratadine (CLARITIN) 10 MG tablet Take 10 mg by mouth daily.  Marland Kitchen losartan (COZAAR) 50 MG tablet Take 25 mg by mouth 2 (two) times daily.  . metFORMIN (GLUCOPHAGE-XR) 500 MG 24 hr tablet TAKE 1 TABLET BY MOUTH TWICE DAILY  . Multiple Vitamins-Minerals (PRESERVISION AREDS PO) Take 1 tablet by mouth 2 (two) times daily.  Marland Kitchen nystatin-triamcinolone (MYCOLOG II) cream Apply 1 application topically 2 (two) times daily.  . Omega 3 1000 MG CAPS Take 1 capsule by mouth daily.   . rosuvastatin (CRESTOR) 10 MG tablet   . traMADol (ULTRAM) 50 MG tablet Take 50 mg by mouth every 6 (six) hours as needed.     Past Medical History:  Diagnosis Date  . Anxiety   .  Arthritis   . Diabetes mellitus without complication ([place])   . Hyperlipidemia   . Hypertension   . Mitral and aortic valve disease   . Occasional tremors   . Shingles   . UTI (urinary tract infection)   . Vascular disease, peripheral [facility])     Past Surgical History:  Procedure Laterality Date  . CARDIAC CATHETERIZATION  1998   40% LM, 95% Ramus interm  . PTCA  08/2013   Left common iliac  . PTCA  12/2012   left ext iliac    Social History Social History  Substance Use Topics  . Smoking status: Former Research scientist (life sciences)  . Smokeless tobacco: Never Used  . Alcohol use No    Family History Family History  Problem Relation Age of Onset  . Dementia Mother   . Diabetes Father     Allergies  Allergen Reactions  . Saxagliptin Diarrhea  .  Codeine     Upset stomach  . Epinephrine   . Ezetimibe Other (See Comments)    myalgias  . Nitrofurantoin     Pruitus, rash  . Limonene Rash  . Sulfa Antibiotics Rash and Other (See Comments)    Sore mouth      REVIEW OF SYSTEMS (Negative unless checked)  Constitutional: [] Weight loss  [] Fever  [] Chills Cardiac: [] Chest pain   [] Chest pressure   [] Palpitations   [] Shortness of breath when laying flat   [] Shortness of breath with exertion. Vascular:  [x] Pain in legs with walking   [x] Pain in legs at rest  [] History of DVT   [] Phlebitis   [] Swelling in legs   [] Varicose veins   [] Non-healing ulcers Pulmonary:   [] Uses home oxygen   [] Productive cough   [] Hemoptysis   [] Wheeze  [] COPD   [] Asthma Neurologic:  [] Dizziness   [] Seizures   [x] History of stroke   [] History of TIA  [] Aphasia   [] Vissual changes   [] Weakness or numbness in arm   [x] Weakness or numbness in leg Musculoskeletal:   [] Joint swelling   [x] Joint pain   [] Low back pain Hematologic:  [] Easy bruising  [] Easy bleeding   [] Hypercoagulable state   [] Anemic Gastrointestinal:  [] Diarrhea   [] Vomiting  [] Gastroesophageal reflux/heartburn   [] Difficulty swallowing. Genitourinary:  [] Chronic kidney disease   [] Difficult urination  [] Frequent urination   [] Blood in urine Skin:  [] Rashes   [] Ulcers  Psychological:  [] History of anxiety   []  History of major depression.  Physical Examination  Vitals:   07/01/17 0957  BP: (!) 154/54  Pulse: (!) 59  Resp: 16  Weight: 53.5 kg (118 lb)  Height: 5\' 1"  (1.549 m)   Body mass index is 22.3 kg/m. Gen: WD/WN, NAD Head: [place]/AT, No temporalis wasting.  Ear/Nose/Throat: Hearing grossly intact, nares w/o erythema or drainage Eyes: PER, EOMI, sclera nonicteric.  Neck: Supple, no large masses.   Pulmonary:  Good air movement, no audible wheezing bilaterally, no use of accessory muscles.  Cardiac: RRR, no JVD Vascular:  Bilateral carotid bruitsnoted Vessel Right Left  Radial Palpable  Palpable  Brachial Palpable Palpable  Carotid Palpable Palpable  PT Not Palpable Not Palpable  DP Not Palpable Not Palpable  Gastrointestinal: Non-distended. No guarding/no peritoneal signs.  Musculoskeletal: M/S 5/5 throughout.  No deformity or atrophy.  Neurologic: CN 2-12 intact. Symmetrical.  Speech is fluent. Motor exam as listed above. Psychiatric: Judgment intact, Mood & affect appropriate for pt's clinical situation. Dermatologic: No rashes or ulcers noted.  No changes consistent with cellulitis. Lymph : No lichenification or  skin changes of chronic lymphedema.  CBC Lab Results  Component Value Date   WBC 7.8 06/02/2017   HGB 10.7 (L) 06/02/2017   HCT 33.7 (L) 06/02/2017   MCV 89 06/02/2017   PLT 267 06/02/2017    BMET    Component Value Date/Time   NA 139 04/09/2017 1216   NA 136 01/16/2016 1135   NA 138 05/29/2014 1202   K 4.9 04/09/2017 1216   K 5.1 05/29/2014 1202   CL 105 04/09/2017 1216   CL 106 05/29/2014 1202   CO2 23 04/09/2017 1216   CO2 25 05/29/2014 1202   GLUCOSE 158 (H) 04/09/2017 1216   GLUCOSE 269 (H) 05/29/2014 1202   BUN 29 (H) 04/09/2017 1216   BUN 19 01/16/2016 1135   BUN 19 (H) 05/29/2014 1202   CREATININE 0.97 04/09/2017 1216   CREATININE 1.10 05/29/2014 1202   CALCIUM 9.4 04/09/2017 1216   CALCIUM 9.1 05/29/2014 1202   GFRNONAA 54 (L) 04/09/2017 1216   GFRNONAA 48 (L) 05/29/2014 1202   GFRAA >60 04/09/2017 1216   GFRAA 56 (L) 05/29/2014 1202   CrCl cannot be calculated (Patient's most recent lab result is older than the maximum 21 days allowed.).  COAG Lab Results  Component Value Date   INR 1.08 04/09/2017   INR 1.06 02/15/2017   INR 1.0 05/03/2013    Radiology No results found.  Assessment/Plan 1. Pain in both lower extremities Recommend:  Patient should undergo arterial duplex of the lower extremity ASAP because there has been a significant deterioration in the patient's lower extremity symptoms.  The patient states  they are having increased pain and a marked decrease in the distance that they can walk.  The risks and benefits as well as the alternatives were discussed in detail with the patient.  All questions were answered.  Patient agrees to proceed and understands this could be a prelude to angiography and intervention.  The patient will follow up with me in the office to review the studies.   - VAS [place] ABI WITH/WO TBI; Future - VAS [place] LOWER EXTREMITY ARTERIAL DUPLEX; Future  2. PAD (peripheral artery disease) ([place]) See #1 - VAS [place] ABI WITH/WO TBI; Future - VAS [place] LOWER EXTREMITY ARTERIAL DUPLEX; Future  3. Cerebrovascular accident (CVA) due to thrombosis of right posterior cerebral artery (HCC) Continue antiplatelet therapy as ordered no changes  4. Bilateral carotid artery stenosis Recommend:  Given the patient's asymptomatic subcritical stenosis no further invasive testing or surgery at this time.  Continue antiplatelet therapy as prescribed Continue management of CAD, HTN and Hyperlipidemia Healthy heart diet,  encouraged exercise at least 4 times per week Follow up in 6 months with duplex ultrasound and physical exam   5. Essential (primary) hypertension Continue antihypertensive medications as already ordered, these medications have been reviewed and there are no changes at this time.   6. Mixed hyperlipidemia Continue statin as ordered and reviewed, no changes at this time     Hortencia Pilar, MD  07/03/2017 9:09 PM

## 2017-07-06 ENCOUNTER — Telehealth: Payer: Self-pay

## 2017-07-06 NOTE — Telephone Encounter (Signed)
Patient called stating woke up with bad headache. Checked Bp it was - 176/50 with pulse at 65. BS was 176 which is higher than normal for her. This was fasting. States she has a Pharmacist, community appt at [facility], and she has an appt with cardiologist in the morning. Wants advice on what to do until then.  Spoke with Dr Army Melia. Called pt and informed her to continue with cardiologist appt in the morning. If headache becomes worse or feeling worse then to see ER.

## 2017-07-07 DIAGNOSIS — Z5181 Encounter for therapeutic drug level monitoring: Secondary | ICD-10-CM | POA: Diagnosis not present

## 2017-07-07 DIAGNOSIS — I2510 Atherosclerotic heart disease of native coronary artery without angina pectoris: Secondary | ICD-10-CM | POA: Diagnosis not present

## 2017-07-07 DIAGNOSIS — E7800 Pure hypercholesterolemia, unspecified: Secondary | ICD-10-CM | POA: Diagnosis not present

## 2017-07-07 DIAGNOSIS — I119 Hypertensive heart disease without heart failure: Secondary | ICD-10-CM | POA: Diagnosis not present

## 2017-07-07 DIAGNOSIS — Z79899 Other long term (current) drug therapy: Secondary | ICD-10-CM | POA: Diagnosis not present

## 2017-07-14 ENCOUNTER — Ambulatory Visit (INDEPENDENT_AMBULATORY_CARE_PROVIDER_SITE_OTHER): Payer: Medicare Other

## 2017-07-14 ENCOUNTER — Encounter (INDEPENDENT_AMBULATORY_CARE_PROVIDER_SITE_OTHER): Payer: Self-pay | Admitting: Vascular Surgery

## 2017-07-14 ENCOUNTER — Ambulatory Visit (INDEPENDENT_AMBULATORY_CARE_PROVIDER_SITE_OTHER): Payer: Medicare Other | Admitting: Vascular Surgery

## 2017-07-14 ENCOUNTER — Encounter (INDEPENDENT_AMBULATORY_CARE_PROVIDER_SITE_OTHER): Payer: Self-pay

## 2017-07-14 VITALS — BP 142/55 | HR 57 | Resp 16 | Wt 117.6 lb

## 2017-07-14 DIAGNOSIS — I10 Essential (primary) hypertension: Secondary | ICD-10-CM | POA: Diagnosis not present

## 2017-07-14 DIAGNOSIS — I70213 Atherosclerosis of native arteries of extremities with intermittent claudication, bilateral legs: Secondary | ICD-10-CM

## 2017-07-14 DIAGNOSIS — I739 Peripheral vascular disease, unspecified: Secondary | ICD-10-CM | POA: Diagnosis not present

## 2017-07-14 DIAGNOSIS — I639 Cerebral infarction, unspecified: Secondary | ICD-10-CM | POA: Diagnosis not present

## 2017-07-14 DIAGNOSIS — E782 Mixed hyperlipidemia: Secondary | ICD-10-CM

## 2017-07-14 NOTE — Progress Notes (Signed)
Subjective:    Patient ID: Jill Hudson, female    DOB: 12/20/1934, 82 y.o.   MRN: [id number] Chief Complaint  Patient presents with  . Follow-up    asap abi,ble art   Presents to review vascular studies. Patient last seen complaining of worsening lower extremity pain particularly to the left lower extremity. Patient with persistent discomfort the left lower extremity. States that this discomfort has progressive the point it is affecting her quality of life and ability to function on a daily basis. The patient underwent a bilateral ABI which was notable for no evidence of significant right lower extremity arterial disease with moderate left lower extremity arterial disease which has not significantly changed when compared to the previous exam. Patient denies any rest pain or ulcerations or lower extremity. Patient denies any fever, nausea or vomiting.   Review of Systems  Constitutional: Negative.   HENT: Negative.   Eyes: Negative.   Respiratory: Negative.   Cardiovascular:       Lower extremity pain  Gastrointestinal: Negative.   Endocrine: Negative.   Genitourinary: Negative.   Musculoskeletal: Negative.   Skin: Negative.   Allergic/Immunologic: Negative.   Neurological: Negative.   Hematological: Negative.   Psychiatric/Behavioral: Negative.       Objective:   Physical Exam  Constitutional: She appears well-developed and well-nourished. No distress.  HENT:  Head: Normocephalic and atraumatic.  Eyes: Pupils are equal, round, and reactive to light. Conjunctivae are normal.  Neck: Normal range of motion.  Cardiovascular: Normal rate, regular rhythm, normal heart sounds and intact distal pulses.   Pulses:      Radial pulses are 2+ on the right side, and 2+ on the left side.       Dorsalis pedis pulses are 1+ on the right side, and 1+ on the left side.       Posterior tibial pulses are 1+ on the right side, and 1+ on the left side.  Pulmonary/Chest: Effort normal.    Musculoskeletal: Normal range of motion. She exhibits no edema.  Neurological: She is alert.  Skin: Skin is dry. She is not diaphoretic.  Psychiatric: She has a normal mood and affect. Her behavior is normal. Judgment and thought content normal.  Vitals reviewed.   BP (!) 142/55   Pulse (!) 57   Resp 16   Wt 117 lb 9.6 oz (53.3 kg)   BMI 22.22 kg/m   Past Medical History:  Diagnosis Date  . Anxiety   . Arthritis   . Diabetes mellitus without complication ([place])   . Hyperlipidemia   . Hypertension   . Mitral and aortic valve disease   . Occasional tremors   . Shingles   . UTI (urinary tract infection)   . Vascular disease, peripheral [facility])     Social History   Social History  . Marital status: Married    Spouse name: N/A  . Number of children: N/A  . Years of education: N/A   Occupational History  . Not on file.   Social History Main Topics  . Smoking status: Former Research scientist (life sciences)  . Smokeless tobacco: Never Used  . Alcohol use No  . Drug use: No  . Sexual activity: Not on file   Other Topics Concern  . Not on file   Social History Narrative  . No narrative on file    Past Surgical History:  Procedure Laterality Date  . CARDIAC CATHETERIZATION  1998   40% LM, 95% Ramus interm  . PTCA  08/2013   Left common iliac  . PTCA  12/2012   left ext iliac    Family History  Problem Relation Age of Onset  . Dementia Mother   . Diabetes Father     Allergies  Allergen Reactions  . Saxagliptin Diarrhea  . Codeine     Upset stomach  . Epinephrine   . Ezetimibe Other (See Comments)    myalgias  . Nitrofurantoin     Pruitus, rash  . Limonene Rash  . Sulfa Antibiotics Rash and Other (See Comments)    Sore mouth        Assessment & Plan:  Presents to review vascular studies. Patient last seen complaining of worsening lower extremity pain particularly to the left lower extremity. Patient with persistent discomfort the left lower extremity. States that this  discomfort has progressive the point it is affecting her quality of life and ability to function on a daily basis. The patient underwent a bilateral ABI which was notable for no evidence of significant right lower extremity arterial disease with moderate left lower extremity arterial disease which has not significantly changed when compared to the previous exam. Patient denies any rest pain or ulcerations or lower extremity. Patient denies any fever, nausea or vomiting.  1. PAD (peripheral artery disease) ([place]) - Stable Patient with persistent left lower extremity claudication. ABI with unchanged moderate left lower extremity PAD. Symptoms have now worsened to the point the patient is unable to function on a daily basis. Recommend a left lower extremity angiogram. Procedure benefits and risks explained to the patient All questions answered Patient will like to wait until the end of October  2. Mixed hyperlipidemia - Stable Encouraged good control as its slows the progression of atherosclerotic disease  3. Essential (primary) hypertension - Stable Encouraged good control as its slows the progression of atherosclerotic disease   Current Outpatient Prescriptions on File Prior to Visit  Medication Sig Dispense Refill  . Acetaminophen (TYLENOL PO) Take by mouth. Takes one 650 mg tablet + one 500 ng tablet at bedtime    . amoxicillin (AMOXIL) 500 MG capsule     . aspirin 81 MG tablet Take 1 tablet by mouth daily.    Marland Kitchen atorvastatin (LIPITOR) 40 MG tablet Take 1 tablet (40 mg total) by mouth daily. 90 tablet 3  . B Complex Vitamins (VITAMIN-B COMPLEX) TABS Take 1 tablet by mouth daily.     . B-D ULTRAFINE III SHORT PEN 31G X 8 MM MISC USE AS DIRECTED( APPLY BY DOSE NOT DAILY) 100 each 3  . Biotin 10 MG TABS Take 1 tablet by mouth daily.     . clopidogrel (PLAVIX) 75 MG tablet TAKE 1 TABLET BY MOUTH EVERY DAY 90 tablet 0  . Cyanocobalamin (RA VITAMIN B-12 TR) 1000 MCG TBCR Take 1 tablet by mouth  daily.     Marland Kitchen diltiazem (CARTIA XT) 180 MG 24 hr capsule Take 1 tablet by mouth 2 (two) times daily.    . Insulin Glargine (BASAGLAR KWIKPEN [place]) Inject 12 Units into the skin 2 (two) times daily at 10 AM and 5 PM.    . loratadine (CLARITIN) 10 MG tablet Take 10 mg by mouth daily.    Marland Kitchen losartan (COZAAR) 50 MG tablet Take 25 mg by mouth 2 (two) times daily.    . metFORMIN (GLUCOPHAGE-XR) 500 MG 24 hr tablet TAKE 1 TABLET BY MOUTH TWICE DAILY 180 tablet 3  . Multiple Vitamins-Minerals (PRESERVISION AREDS PO) Take 1 tablet by mouth  2 (two) times daily.    Marland Kitchen nystatin-triamcinolone (MYCOLOG II) cream Apply 1 application topically 2 (two) times daily. 30 g 0  . Omega 3 1000 MG CAPS Take 1 capsule by mouth daily.     . rosuvastatin (CRESTOR) 10 MG tablet   1  . traMADol (ULTRAM) 50 MG tablet Take 50 mg by mouth every 6 (six) hours as needed.      No current facility-administered medications on file prior to visit.     There are no Patient Instructions on file for this visit. No Follow-up on file.   Frederika Hukill A Nishaan Stanke, PA-C

## 2017-07-16 ENCOUNTER — Encounter (INDEPENDENT_AMBULATORY_CARE_PROVIDER_SITE_OTHER): Payer: Self-pay

## 2017-07-20 ENCOUNTER — Other Ambulatory Visit (INDEPENDENT_AMBULATORY_CARE_PROVIDER_SITE_OTHER): Payer: Self-pay | Admitting: Vascular Surgery

## 2017-07-30 DIAGNOSIS — E785 Hyperlipidemia, unspecified: Secondary | ICD-10-CM | POA: Diagnosis not present

## 2017-07-30 DIAGNOSIS — E1142 Type 2 diabetes mellitus with diabetic polyneuropathy: Secondary | ICD-10-CM | POA: Diagnosis not present

## 2017-07-30 DIAGNOSIS — E1159 Type 2 diabetes mellitus with other circulatory complications: Secondary | ICD-10-CM | POA: Diagnosis not present

## 2017-07-30 DIAGNOSIS — E1169 Type 2 diabetes mellitus with other specified complication: Secondary | ICD-10-CM | POA: Diagnosis not present

## 2017-07-30 DIAGNOSIS — Z794 Long term (current) use of insulin: Secondary | ICD-10-CM | POA: Diagnosis not present

## 2017-07-30 DIAGNOSIS — I10 Essential (primary) hypertension: Secondary | ICD-10-CM | POA: Diagnosis not present

## 2017-07-30 DIAGNOSIS — E1165 Type 2 diabetes mellitus with hyperglycemia: Secondary | ICD-10-CM | POA: Diagnosis not present

## 2017-07-30 LAB — HEMOGLOBIN A1C: Hemoglobin A1C: 8.4

## 2017-08-11 ENCOUNTER — Ambulatory Visit
Admission: EM | Admit: 2017-08-11 | Discharge: 2017-08-11 | Disposition: A | Discharge status: Home / Self Care | Payer: Medicare Other | Attending: Family Medicine | Admitting: Family Medicine

## 2017-08-11 DIAGNOSIS — R519 Headache, unspecified: Secondary | ICD-10-CM

## 2017-08-11 DIAGNOSIS — I10 Essential (primary) hypertension: Secondary | ICD-10-CM | POA: Diagnosis not present

## 2017-08-11 DIAGNOSIS — R51 Headache: Secondary | ICD-10-CM

## 2017-08-11 MED ORDER — ACETAMINOPHEN 500 MG PO TABS
1000.0000 mg | ORAL_TABLET | Freq: Once | ORAL | Status: AC
  Administered 2017-08-11: 1000 mg via ORAL

## 2017-08-11 MED ORDER — CLONIDINE HCL 0.1 MG PO TABS
0.1000 mg | ORAL_TABLET | Freq: Once | ORAL | Status: AC
  Administered 2017-08-11: 0.1 mg via ORAL

## 2017-08-11 NOTE — Discharge Instructions (Signed)
Tylenol as needed Follow up with primary care provider

## 2017-08-11 NOTE — ED Triage Notes (Signed)
Pt reports she awoke today with a headache. Started out with a frontal headache but has eased off to a 6/10. Has now taken her blood pressure medication but wanted [place] to check it because her machines were coming up with error messages.

## 2017-08-11 NOTE — ED Provider Notes (Signed)
[facility]    CSN: [id number] Arrival date & time: 08/11/17  1029     History   Chief Complaint Chief Complaint  Patient presents with  . Headache    HPI Jill Hudson is a 82 y.o. female.   82 yo female with a c/o headache this morning and high blood pressure. Denies any fevers, chills, congestion, vision changes, numbness/tingling, one-sided weakness, chest pain, shortness of breath.    The history is provided by the patient.    Past Medical History:  Diagnosis Date  . Anxiety   . Arthritis   . Diabetes mellitus without complication ([place])   . Hyperlipidemia   . Hypertension   . Mitral and aortic valve disease   . Occasional tremors   . Shingles   . UTI (urinary tract infection)   . Vascular disease, peripheral [facility])     Patient Active Problem List   Diagnosis Date Noted  . Leg pain 07/03/2017  . Carpal tunnel syndrome, bilateral 05/05/2017  . Myalgia due to HMG CoA reductase inhibitor 05/05/2017  . CVA (cerebral vascular accident) ([place]) 02/15/2017  . Degenerative disc disease, lumbar 12/21/2016  . Atherosclerosis of native arteries of extremity with intermittent claudication ([place]) 12/20/2016  . Bilateral carotid artery stenosis 12/20/2016  . Hip bursitis 05/18/2016  . Elevated TSH 01/18/2016  . Chronic renal insufficiency, stage III (moderate) ([place]) 01/16/2016  . Senile ecchymosis 01/16/2016  . Uncontrolled type 2 diabetes mellitus with diabetic polyneuropathy, with long-term current use of insulin ([place]) 09/16/2015  . GERD (gastroesophageal reflux disease) 07/24/2015  . PAD (peripheral artery disease) ([place]) 05/17/2015  . Neoplasm of uncertain behavior of skin 05/17/2015  . Background retinopathy due to secondary diabetes ([place]) 04/11/2015  . Mixed hyperlipidemia 04/11/2015  . Essential (primary) hypertension 04/11/2015  . Generalized OA 04/11/2015  . Arteriosclerosis of coronary artery 05/29/2013  . Hypertensive heart disease without CHF  05/29/2013    Past Surgical History:  Procedure Laterality Date  . CARDIAC CATHETERIZATION  1998   40% LM, 95% Ramus interm  . PTCA  08/2013   Left common iliac  . PTCA  12/2012   left ext iliac    OB History    No data available       Home Medications    Prior to Admission medications   Medication Sig Start Date End Date Taking? Authorizing Provider  Acetaminophen (TYLENOL PO) Take by mouth. Takes one 650 mg tablet + one 500 ng tablet at bedtime    [provider]  amoxicillin (AMOXIL) 500 MG capsule  06/29/17   [provider]  aspirin 81 MG tablet Take 1 tablet by mouth daily.    [provider]  atorvastatin (LIPITOR) 40 MG tablet Take 1 tablet (40 mg total) by mouth daily. 06/02/17   [name], MD  B Complex Vitamins (VITAMIN-B COMPLEX) TABS Take 1 tablet by mouth daily.     [provider]  B-D ULTRAFINE III SHORT PEN 31G X 8 MM MISC USE AS DIRECTED( APPLY BY DOSE NOT DAILY) 02/09/17   [name], MD  Biotin 10 MG TABS Take 1 tablet by mouth daily.     [provider]  clopidogrel (PLAVIX) 75 MG tablet TAKE 1 TABLET BY MOUTH EVERY DAY 05/25/17   Schnier, Dolores Lory, MD  Cyanocobalamin (RA VITAMIN B-12 TR) 1000 MCG TBCR Take 1 tablet by mouth daily.     [provider]  diltiazem (CARTIA XT) 180 MG 24 hr capsule Take  1 tablet by mouth 2 (two) times daily.    [provider]  Insulin Glargine (BASAGLAR KWIKPEN [place]) Inject 12 Units into the skin 2 (two) times daily at 10 AM and 5 PM.    [provider]  loratadine (CLARITIN) 10 MG tablet Take 10 mg by mouth daily.    [provider]  losartan (COZAAR) 50 MG tablet Take 25 mg by mouth 2 (two) times daily. 04/06/17 04/06/18  [provider]  metFORMIN (GLUCOPHAGE-XR) 500 MG 24 hr tablet TAKE 1 TABLET BY MOUTH TWICE DAILY 04/26/17   [name], MD  Multiple Vitamins-Minerals (PRESERVISION AREDS PO) Take 1 tablet by mouth 2 (two)  times daily.    [provider]  nystatin-triamcinolone (MYCOLOG II) cream Apply 1 application topically 2 (two) times daily. 12/21/16   [name], MD  Omega 3 1000 MG CAPS Take 1 capsule by mouth daily.  12/12/09   [provider]  rosuvastatin (CRESTOR) 10 MG tablet  04/06/17   [provider]  traMADol (ULTRAM) 50 MG tablet Take 50 mg by mouth every 6 (six) hours as needed.  09/16/16   [provider]    Family History Family History  Problem Relation Age of Onset  . Dementia Mother   . Diabetes Father     Social History Social History  Substance Use Topics  . Smoking status: Former Research scientist (life sciences)  . Smokeless tobacco: Never Used  . Alcohol use No     Allergies   Saxagliptin; Codeine; Epinephrine; Ezetimibe; Nitrofurantoin; Limonene; and Sulfa antibiotics   Review of Systems Review of Systems   Physical Exam Triage Vital Signs ED Triage Vitals  Enc Vitals Group     BP 08/11/17 1045 (!) 195/58     Pulse Rate 08/11/17 1045 63     Resp 08/11/17 1045 16     Temp 08/11/17 1045 97.6 F (36.4 C)     Temp Source 08/11/17 1045 Oral     SpO2 08/11/17 1045 100 %     Weight 08/11/17 1046 119 lb (54 kg)     Height 08/11/17 1046 5' 1.5" (1.562 m)     Head Circumference --      Peak Flow --      Pain Score 08/11/17 1046 6     Pain Loc --      Pain Edu? --      Excl. in [place]? --    No data found.   Updated Vital Signs BP (!) 158/58 (BP Location: Left Arm)   Pulse 63   Temp 97.6 F (36.4 C) (Oral)   Resp 16   Ht 5' 1.5" (1.562 m)   Wt 119 lb (54 kg)   SpO2 100%   BMI 22.12 kg/m   Visual Acuity Right Eye Distance:   Left Eye Distance:   Bilateral Distance:    Right Eye Near:   Left Eye Near:    Bilateral Near:     Physical Exam  Constitutional: She is oriented to person, place, and time. She appears well-developed and well-nourished. No distress.  HENT:  Head: Normocephalic and atraumatic.  Nose: No mucosal edema,  rhinorrhea, nose lacerations, sinus tenderness, nasal deformity, septal deviation or nasal septal hematoma. No epistaxis.  No foreign bodies.  Mouth/Throat: Uvula is midline, oropharynx is clear and moist and mucous membranes are normal. No oropharyngeal exudate.  Eyes: Pupils are equal, round, and reactive to light. Conjunctivae and EOM are normal. Right eye exhibits no discharge. Left eye  exhibits no discharge. No scleral icterus.  Neck: Normal range of motion. Neck supple. No thyromegaly present.  Cardiovascular: Normal rate, regular rhythm and normal heart sounds.   Pulmonary/Chest: Effort normal and breath sounds normal. No respiratory distress. She has no wheezes. She has no rales.  Lymphadenopathy:    She has no cervical adenopathy.  Neurological: She is alert and oriented to person, place, and time. She displays normal reflexes. No cranial nerve deficit or sensory deficit. She exhibits normal muscle tone. Coordination normal.  Skin: She is not diaphoretic.  Nursing note and vitals reviewed.    UC Treatments / Results  Labs (all labs ordered are listed, but only abnormal results are displayed) Labs Reviewed - No data to display  EKG  EKG Interpretation None       Radiology No results found.  Procedures Procedures (including critical care time)  Medications Ordered in UC Medications  acetaminophen (TYLENOL) tablet 1,000 mg (1,000 mg Oral Given 08/11/17 1111)  cloNIDine (CATAPRES) tablet 0.1 mg (0.1 mg Oral Given 08/11/17 1110)     Initial Impression / Assessment and Plan / UC Course  I have reviewed the triage vital signs and the nursing notes.  Pertinent labs & imaging results that were available during my care of the patient were reviewed by me and considered in my medical decision making (see chart for details).       Final Clinical Impressions(s) / UC Diagnoses   Final diagnoses:  Acute nonintractable headache, unspecified headache type  Essential  hypertension    New Prescriptions Discharge Medication List as of 08/11/2017 11:54 AM     1. diagnosis reviewed with patient 2. Patient given clonidine 0.1mg  po x 1 and tylenol 1gm po x 1 3. Recommend supportive treatment with otc tylenol prn 4. Follow up with PCP 5. Follow-up prn if symptoms worsen or don't improve  Controlled Substance Prescriptions [place] Controlled Substance Registry consulted? Not Applicable   Norval Gable, MD 08/11/17 1309

## 2017-08-14 IMAGING — CR DG FOREARM 2V*L*
2 series · 2 of 2 positions shown · non-contrast
Comparison: None.

CLINICAL DATA: Injured left forearm several months ago, still
tender

EXAM:
LEFT FOREARM - 2 VIEW

[forearm ap]
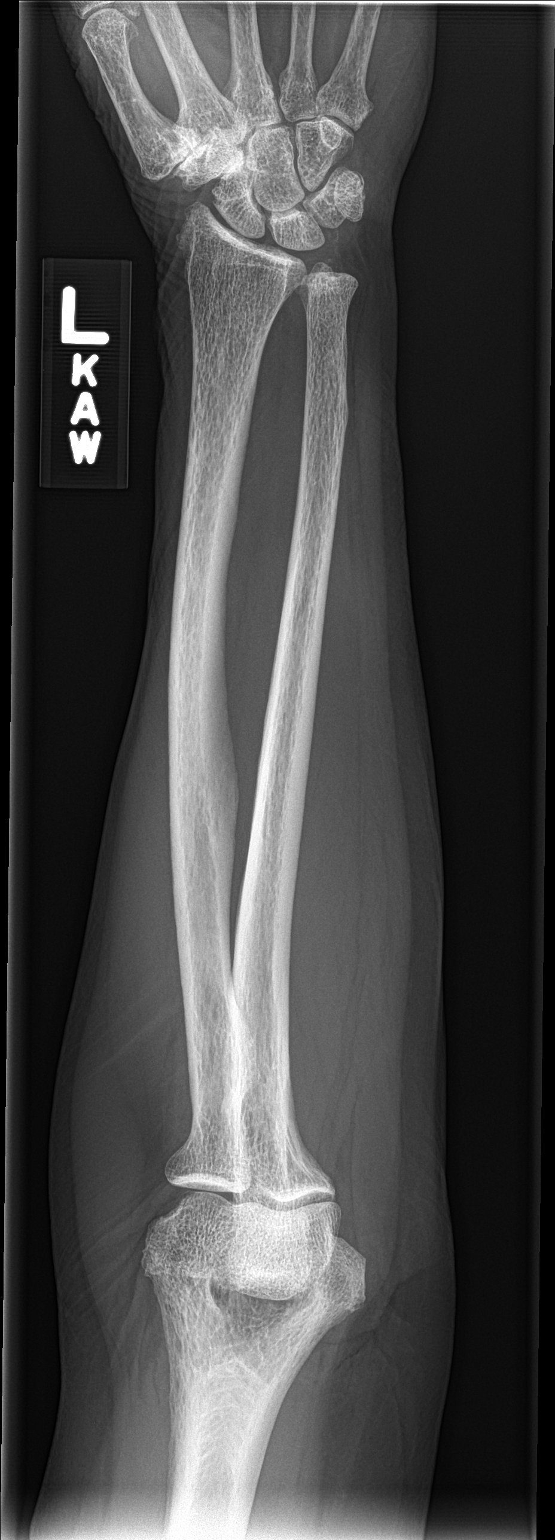

[forearm lat]
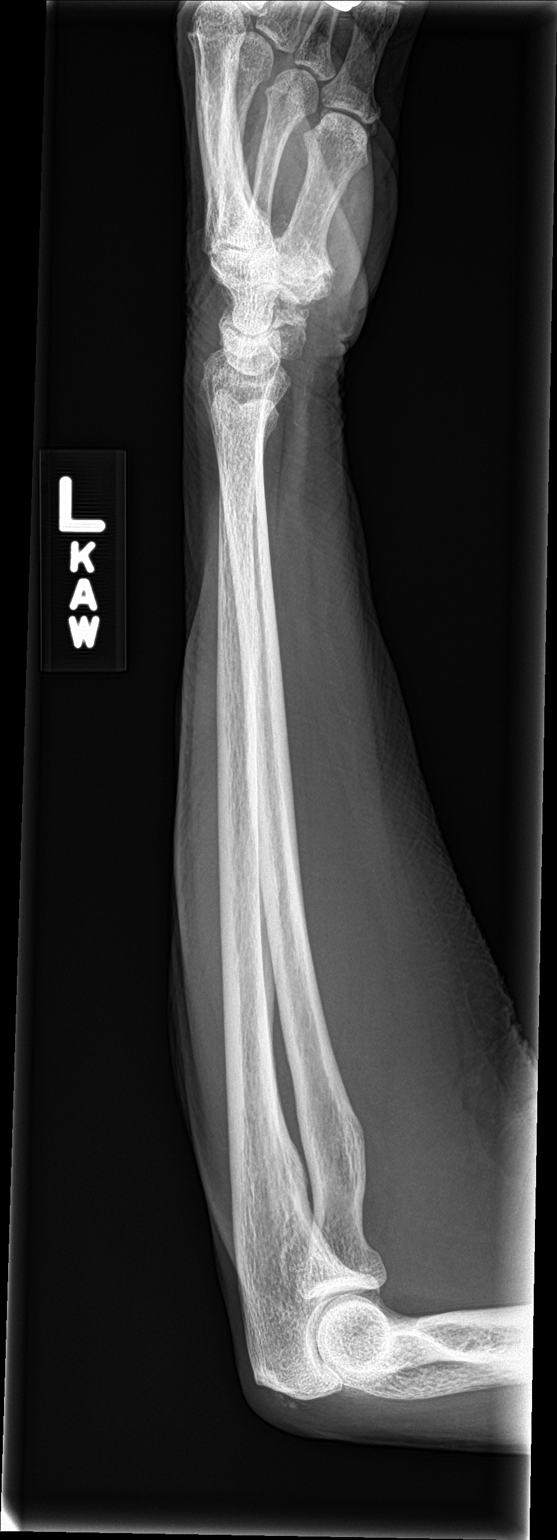

[2 of 2 positions shown; findings below may reference images not displayed]

FINDINGS: No acute fracture is seen. The bones are somewhat osteopenic. The
radiocarpal joint space appears normal. There is degenerative change
involving the left first CMC joint. No abnormality of the left elbow
is seen..
IMPRESSION: No acute abnormality. Osteopenia. Degenerative change of the left
first CMC joint.

## 2017-08-17 ENCOUNTER — Encounter: Payer: Self-pay | Admitting: Internal Medicine

## 2017-08-17 ENCOUNTER — Ambulatory Visit (INDEPENDENT_AMBULATORY_CARE_PROVIDER_SITE_OTHER): Payer: Medicare Other | Admitting: Internal Medicine

## 2017-08-17 VITALS — BP 144/48 | HR 56 | Ht 61.0 in | Wt 118.0 lb

## 2017-08-17 DIAGNOSIS — I10 Essential (primary) hypertension: Secondary | ICD-10-CM

## 2017-08-17 DIAGNOSIS — I639 Cerebral infarction, unspecified: Secondary | ICD-10-CM | POA: Diagnosis not present

## 2017-08-17 DIAGNOSIS — M791 Myalgia, unspecified site: Secondary | ICD-10-CM | POA: Diagnosis not present

## 2017-08-17 DIAGNOSIS — Z23 Encounter for immunization: Secondary | ICD-10-CM | POA: Diagnosis not present

## 2017-08-17 DIAGNOSIS — G5603 Carpal tunnel syndrome, bilateral upper limbs: Secondary | ICD-10-CM

## 2017-08-17 NOTE — Progress Notes (Signed)
Date:  08/17/2017   Name:  Jill Hudson   DOB:  01/13/1935   MRN:  [id number]   Chief Complaint: Hypertension (Was seen in UC a week ago- and then seen cardiologist changed medication. And UC told her to follow up with PCP. ) and Immunizations (High Dose. )  Hypertension  This is a chronic problem. The problem has been waxing and waning since onset. The problem is controlled. Associated symptoms include headaches. Pertinent negatives include no chest pain or peripheral edema. Risk factors for coronary artery disease include diabetes mellitus and dyslipidemia. Past treatments include calcium channel blockers and angiotensin blockers. The current treatment provides moderate improvement. There are no compliance problems.    Numbness in wrist/fingers - has had tingling in left hand in the past and wore a wrist splint,  Never needed surgery or injections.   Myalgias - she has been having myalgia in shoulders, upper arms and thighs.  Aching discomfort and mild weakness.  No fever or chills.  DM - now on Lantus AM and novolog with meals + SSI.  Glucoses are much better.  No low readings.   Review of Systems  Constitutional: Negative for chills, fatigue and fever.  Respiratory: Negative for chest tightness and wheezing.   Cardiovascular: Positive for leg swelling (mild ankle edema). Negative for chest pain.  Gastrointestinal: Negative for abdominal pain, diarrhea and vomiting.  Musculoskeletal: Positive for arthralgias and myalgias.  Neurological: Positive for numbness (in fingers of both hands and soles of feet) and headaches. Negative for dizziness, tremors, syncope and weakness.  Hematological: Negative for adenopathy.    Patient Active Problem List   Diagnosis Date Noted  . Leg pain 07/03/2017  . Carpal tunnel syndrome, bilateral 05/05/2017  . Myalgia due to HMG CoA reductase inhibitor 05/05/2017  . CVA (cerebral vascular accident) ([place]) 02/15/2017  . Degenerative disc disease, lumbar  12/21/2016  . Atherosclerosis of native arteries of extremity with intermittent claudication ([place]) 12/20/2016  . Bilateral carotid artery stenosis 12/20/2016  . Hip bursitis 05/18/2016  . Elevated TSH 01/18/2016  . Chronic renal insufficiency, stage III (moderate) ([place]) 01/16/2016  . Senile ecchymosis 01/16/2016  . Uncontrolled type 2 diabetes mellitus with diabetic polyneuropathy, with long-term current use of insulin ([place]) 09/16/2015  . GERD (gastroesophageal reflux disease) 07/24/2015  . PAD (peripheral artery disease) ([name]) 05/17/2015  . Neoplasm of uncertain behavior of skin 05/17/2015  . Background retinopathy due to secondary diabetes ([place]) 04/11/2015  . Mixed hyperlipidemia 04/11/2015  . Essential (primary) hypertension 04/11/2015  . Generalized OA 04/11/2015  . Arteriosclerosis of coronary artery 05/29/2013  . Hypertensive heart disease without CHF 05/29/2013    Prior to Admission medications   Medication Sig Start Date End Date Taking? Authorizing Provider  acetaminophen (TYLENOL) 650 MG CR tablet Take 1,300 mg by mouth every 8 (eight) hours as needed for pain.   Yes [provider]  aspirin 81 MG tablet Take 81 mg by mouth daily.    Yes [provider]  atorvastatin (LIPITOR) 40 MG tablet Take 1 tablet (40 mg total) by mouth daily. 06/02/17  Yes [name], MD  B Complex Vitamins (VITAMIN-B COMPLEX) TABS Take 1 tablet by mouth daily.    Yes [provider]  B-D ULTRAFINE III SHORT PEN 31G X 8 MM MISC USE AS DIRECTED( APPLY BY DOSE NOT DAILY) 02/09/17  Yes [name], MD  Biotin 10 MG TABS Take 10 mg by mouth daily.    Yes [provider]  clopidogrel (PLAVIX) 75 MG tablet TAKE 1 TABLET BY MOUTH EVERY DAY Patient taking differently: Take 75 mg by mouth every day 05/25/17  Yes Schnier, Dolores Lory, MD  Cyanocobalamin (RA VITAMIN B-12 TR) 1000 MCG TBCR Take 1,000 mcg by mouth daily.    Yes [provider]  diltiazem (CARTIA  XT) 180 MG 24 hr capsule Take 180 mg by mouth 2 (two) times daily.    Yes [provider]  insulin aspart (NOVOLOG FLEXPEN) 100 UNIT/ML FlexPen Inject 4 Units into the skin 3 (three) times daily before meals. 03/26/17  Yes [provider]  insulin glargine (LANTUS) 100 unit/mL SOPN Inject 12 Units into the skin daily.   Yes [provider]  loratadine (CLARITIN) 10 MG tablet Take 10 mg by mouth daily as needed for allergies.    Yes [provider]  losartan (COZAAR) 50 MG tablet Take 25-50 mg by mouth See admin instructions. Take 50 mg by mouth in the morning and take 25 mg by mouth at bedtime 04/06/17 04/06/18 Yes [provider]  metFORMIN (GLUCOPHAGE-XR) 500 MG 24 hr tablet TAKE 1 TABLET BY MOUTH TWICE DAILY Patient taking differently: TAKE 500 MG BY MOUTH TWICE DAILY 04/26/17  Yes [name], MD  Multiple Vitamins-Minerals (PRESERVISION AREDS PO) Take 1 capsule by mouth 2 (two) times daily.    Yes [provider]  nystatin-triamcinolone (MYCOLOG II) cream Apply 1 application topically 2 (two) times daily. 12/21/16  Yes [name], MD  Omega 3 1000 MG CAPS Take 1,000 mg by mouth daily.  12/12/09  Yes [provider]  rosuvastatin (CRESTOR) 10 MG tablet Take 10 mg by mouth daily.  04/06/17  Yes [provider]    Allergies  Allergen Reactions  . Saxagliptin Diarrhea  . Codeine Other (See Comments)    Upset stomach  . Epinephrine Other (See Comments)    Unknown  . Ezetimibe Other (See Comments)    myalgias  . Nitrofurantoin Other (See Comments)    Pruitus, rash  . Limonene Rash  . Sulfa Antibiotics Rash and Other (See Comments)    Sore mouth     Past Surgical History:  Procedure Laterality Date  . CARDIAC CATHETERIZATION  1998   40% LM, 95% Ramus interm  . PTCA  08/2013   Left common iliac  . PTCA  12/2012   left ext iliac    Social History  Substance Use Topics  . Smoking status: Former Research scientist (life sciences)  .  Smokeless tobacco: Never Used  . Alcohol use No     Medication list has been reviewed and updated.  PHQ 2/9 Scores 06/02/2017 06/26/2016 05/17/2015  PHQ - 2 Score 1 0 0    Physical Exam  Constitutional: She is oriented to person, place, and time. She appears well-developed. No distress.  HENT:  Head: Normocephalic and atraumatic.  Neck: Normal range of motion. Neck supple.  Cardiovascular: Normal rate, regular rhythm and normal heart sounds.   Pulmonary/Chest: Effort normal and breath sounds normal. No respiratory distress. She has no wheezes.  Musculoskeletal: She exhibits no edema.  Positive Tinels and Phalens on right  Positive Phalens, negative Tinels on left  General muscle tenderness in upper arms and anterior thighs  Neurological: She is alert and oriented to person, place, and time. She has normal strength.  Skin: Skin is warm and dry. No rash noted.  Psychiatric: She has a normal mood and affect. Her behavior is normal. Thought content normal.  Nursing note and vitals  reviewed.   BP (!) 144/48   Pulse (!) 56   Ht 5\' 1"  (1.549 m)   Wt 118 lb (53.5 kg)   SpO2 99%   BMI 22.30 kg/m   Assessment and Plan: 1. Essential (primary) hypertension Improved with adjustment in losartan dose  2. Need for influenza vaccination - Flu vaccine HIGH DOSE PF  3. Myalgia Rule of PMR - Sedimentation rate  4. Carpal tunnel syndrome on both sides - Ambulatory referral to Orthopedic Surgery   No orders of the defined types were placed in this encounter.   Partially dictated using Editor, commissioning. Any errors are unintentional.  [name], MD [facility]  08/17/2017

## 2017-08-18 LAB — SEDIMENTATION RATE: Sed Rate: 11 mm/hr (ref 0–40)

## 2017-08-20 ENCOUNTER — Encounter
Admission: RE | Admit: 2017-08-20 | Discharge: 2017-08-20 | Disposition: A | Discharge status: Home / Self Care | Payer: Medicare Other | Source: Ambulatory Visit | Attending: Vascular Surgery | Admitting: Vascular Surgery

## 2017-08-20 DIAGNOSIS — Z9889 Other specified postprocedural states: Secondary | ICD-10-CM | POA: Diagnosis not present

## 2017-08-20 DIAGNOSIS — N183 Chronic kidney disease, stage 3 (moderate): Secondary | ICD-10-CM | POA: Diagnosis not present

## 2017-08-20 DIAGNOSIS — Z8619 Personal history of other infectious and parasitic diseases: Secondary | ICD-10-CM | POA: Diagnosis not present

## 2017-08-20 DIAGNOSIS — E1151 Type 2 diabetes mellitus with diabetic peripheral angiopathy without gangrene: Secondary | ICD-10-CM | POA: Diagnosis not present

## 2017-08-20 DIAGNOSIS — E785 Hyperlipidemia, unspecified: Secondary | ICD-10-CM | POA: Diagnosis not present

## 2017-08-20 DIAGNOSIS — Z9842 Cataract extraction status, left eye: Secondary | ICD-10-CM | POA: Diagnosis not present

## 2017-08-20 DIAGNOSIS — Z885 Allergy status to narcotic agent status: Secondary | ICD-10-CM | POA: Diagnosis not present

## 2017-08-20 DIAGNOSIS — Z85828 Personal history of other malignant neoplasm of skin: Secondary | ICD-10-CM | POA: Diagnosis not present

## 2017-08-20 DIAGNOSIS — Z833 Family history of diabetes mellitus: Secondary | ICD-10-CM | POA: Diagnosis not present

## 2017-08-20 DIAGNOSIS — Z8673 Personal history of transient ischemic attack (TIA), and cerebral infarction without residual deficits: Secondary | ICD-10-CM | POA: Diagnosis not present

## 2017-08-20 DIAGNOSIS — E1122 Type 2 diabetes mellitus with diabetic chronic kidney disease: Secondary | ICD-10-CM | POA: Diagnosis not present

## 2017-08-20 DIAGNOSIS — I129 Hypertensive chronic kidney disease with stage 1 through stage 4 chronic kidney disease, or unspecified chronic kidney disease: Secondary | ICD-10-CM | POA: Diagnosis not present

## 2017-08-20 DIAGNOSIS — Z820 Family history of epilepsy and other diseases of the nervous system: Secondary | ICD-10-CM | POA: Diagnosis not present

## 2017-08-20 DIAGNOSIS — Z87440 Personal history of urinary (tract) infections: Secondary | ICD-10-CM | POA: Diagnosis not present

## 2017-08-20 DIAGNOSIS — Z87891 Personal history of nicotine dependence: Secondary | ICD-10-CM | POA: Diagnosis not present

## 2017-08-20 DIAGNOSIS — I080 Rheumatic disorders of both mitral and aortic valves: Secondary | ICD-10-CM | POA: Diagnosis not present

## 2017-08-20 DIAGNOSIS — Z9841 Cataract extraction status, right eye: Secondary | ICD-10-CM | POA: Diagnosis not present

## 2017-08-20 DIAGNOSIS — Z888 Allergy status to other drugs, medicaments and biological substances status: Secondary | ICD-10-CM | POA: Diagnosis not present

## 2017-08-20 DIAGNOSIS — Z882 Allergy status to sulfonamides status: Secondary | ICD-10-CM | POA: Diagnosis not present

## 2017-08-20 DIAGNOSIS — M1990 Unspecified osteoarthritis, unspecified site: Secondary | ICD-10-CM | POA: Diagnosis not present

## 2017-08-20 DIAGNOSIS — I70212 Atherosclerosis of native arteries of extremities with intermittent claudication, left leg: Secondary | ICD-10-CM | POA: Diagnosis not present

## 2017-08-20 DIAGNOSIS — Z9851 Tubal ligation status: Secondary | ICD-10-CM | POA: Diagnosis not present

## 2017-08-20 HISTORY — DX: Malignant (primary) neoplasm, unspecified: C80.1

## 2017-08-20 HISTORY — DX: Transient cerebral ischemic attack, unspecified: G45.9

## 2017-08-20 LAB — CREATININE, SERUM
Creatinine, Ser: 1.17 mg/dL — ABNORMAL HIGH (ref 0.44–1.00)
GFR calc non Af Amer: 43 mL/min — ABNORMAL LOW (ref >60)
GFR, EST AFRICAN AMERICAN: 50 mL/min — AB (ref >60)

## 2017-08-20 LAB — BUN: BUN: 27 mg/dL — AB (ref 6–20)

## 2017-08-22 MED ORDER — CEFAZOLIN SODIUM-DEXTROSE 2-4 GM/100ML-% IV SOLN: 2.0000 g | Freq: Once | INTRAVENOUS | Status: DC

## 2017-08-23 ENCOUNTER — Encounter
Admission: RE | Disposition: A | Discharge status: Home / Self Care | Payer: Self-pay | Source: Ambulatory Visit | Attending: Vascular Surgery

## 2017-08-23 ENCOUNTER — Encounter: Payer: Self-pay | Admitting: Registered Nurse

## 2017-08-23 ENCOUNTER — Ambulatory Visit
Admission: RE | Admit: 2017-08-23 | Discharge: 2017-08-23 | Disposition: A | Discharge status: Home / Self Care | Payer: Medicare Other | Source: Ambulatory Visit | Attending: Vascular Surgery | Admitting: Vascular Surgery

## 2017-08-23 ENCOUNTER — Encounter: Payer: Self-pay | Admitting: *Deleted

## 2017-08-23 DIAGNOSIS — Z820 Family history of epilepsy and other diseases of the nervous system: Secondary | ICD-10-CM | POA: Insufficient documentation

## 2017-08-23 DIAGNOSIS — Z85828 Personal history of other malignant neoplasm of skin: Secondary | ICD-10-CM | POA: Insufficient documentation

## 2017-08-23 DIAGNOSIS — Z9842 Cataract extraction status, left eye: Secondary | ICD-10-CM | POA: Insufficient documentation

## 2017-08-23 DIAGNOSIS — M1990 Unspecified osteoarthritis, unspecified site: Secondary | ICD-10-CM | POA: Insufficient documentation

## 2017-08-23 DIAGNOSIS — E785 Hyperlipidemia, unspecified: Secondary | ICD-10-CM | POA: Insufficient documentation

## 2017-08-23 DIAGNOSIS — Z885 Allergy status to narcotic agent status: Secondary | ICD-10-CM | POA: Insufficient documentation

## 2017-08-23 DIAGNOSIS — N183 Chronic kidney disease, stage 3 (moderate): Secondary | ICD-10-CM | POA: Diagnosis not present

## 2017-08-23 DIAGNOSIS — Z9889 Other specified postprocedural states: Secondary | ICD-10-CM | POA: Insufficient documentation

## 2017-08-23 DIAGNOSIS — E119 Type 2 diabetes mellitus without complications: Secondary | ICD-10-CM | POA: Diagnosis not present

## 2017-08-23 DIAGNOSIS — I70212 Atherosclerosis of native arteries of extremities with intermittent claudication, left leg: Secondary | ICD-10-CM | POA: Insufficient documentation

## 2017-08-23 DIAGNOSIS — E1151 Type 2 diabetes mellitus with diabetic peripheral angiopathy without gangrene: Secondary | ICD-10-CM | POA: Insufficient documentation

## 2017-08-23 DIAGNOSIS — E1122 Type 2 diabetes mellitus with diabetic chronic kidney disease: Secondary | ICD-10-CM | POA: Insufficient documentation

## 2017-08-23 DIAGNOSIS — Z833 Family history of diabetes mellitus: Secondary | ICD-10-CM | POA: Insufficient documentation

## 2017-08-23 DIAGNOSIS — Z87440 Personal history of urinary (tract) infections: Secondary | ICD-10-CM | POA: Insufficient documentation

## 2017-08-23 DIAGNOSIS — I080 Rheumatic disorders of both mitral and aortic valves: Secondary | ICD-10-CM | POA: Insufficient documentation

## 2017-08-23 DIAGNOSIS — I129 Hypertensive chronic kidney disease with stage 1 through stage 4 chronic kidney disease, or unspecified chronic kidney disease: Secondary | ICD-10-CM | POA: Diagnosis not present

## 2017-08-23 DIAGNOSIS — Z9851 Tubal ligation status: Secondary | ICD-10-CM | POA: Insufficient documentation

## 2017-08-23 DIAGNOSIS — Z882 Allergy status to sulfonamides status: Secondary | ICD-10-CM | POA: Insufficient documentation

## 2017-08-23 DIAGNOSIS — Z87891 Personal history of nicotine dependence: Secondary | ICD-10-CM | POA: Insufficient documentation

## 2017-08-23 DIAGNOSIS — Z8673 Personal history of transient ischemic attack (TIA), and cerebral infarction without residual deficits: Secondary | ICD-10-CM | POA: Insufficient documentation

## 2017-08-23 DIAGNOSIS — Z8619 Personal history of other infectious and parasitic diseases: Secondary | ICD-10-CM | POA: Insufficient documentation

## 2017-08-23 DIAGNOSIS — Z888 Allergy status to other drugs, medicaments and biological substances status: Secondary | ICD-10-CM | POA: Insufficient documentation

## 2017-08-23 DIAGNOSIS — Z9841 Cataract extraction status, right eye: Secondary | ICD-10-CM | POA: Insufficient documentation

## 2017-08-23 DIAGNOSIS — M79605 Pain in left leg: Secondary | ICD-10-CM | POA: Diagnosis not present

## 2017-08-23 DIAGNOSIS — I10 Essential (primary) hypertension: Secondary | ICD-10-CM | POA: Diagnosis not present

## 2017-08-23 HISTORY — PX: LOWER EXTREMITY ANGIOGRAPHY: CATH118251

## 2017-08-23 SURGERY — LOWER EXTREMITY ANGIOGRAPHY
Anesthesia: Moderate Sedation | Laterality: Left

## 2017-08-23 MED ORDER — HEPARIN SODIUM (PORCINE) 1000 UNIT/ML IJ SOLN
INTRAMUSCULAR | Status: AC
  Filled 2017-08-23: qty 1

## 2017-08-23 MED ORDER — MIDAZOLAM HCL 2 MG/2ML IJ SOLN
INTRAMUSCULAR | Status: AC
  Filled 2017-08-23: qty 4

## 2017-08-23 MED ORDER — SODIUM CHLORIDE 0.9 % IV SOLN: INTRAVENOUS | Status: DC

## 2017-08-23 MED ORDER — FENTANYL CITRATE (PF) 100 MCG/2ML IJ SOLN
INTRAMUSCULAR | Status: AC
  Filled 2017-08-23: qty 2

## 2017-08-23 MED ORDER — MIDAZOLAM HCL 2 MG/2ML IJ SOLN
INTRAMUSCULAR | Status: DC | PRN
  Administered 2017-08-23: 2 mg via INTRAVENOUS
  Administered 2017-08-23: 1 mg

## 2017-08-23 MED ORDER — FAMOTIDINE 20 MG PO TABS: 40.0000 mg | ORAL_TABLET | ORAL | Status: DC | PRN

## 2017-08-23 MED ORDER — SODIUM CHLORIDE 0.9% FLUSH: 3.0000 mL | INTRAVENOUS | Status: DC | PRN

## 2017-08-23 MED ORDER — CEFAZOLIN SODIUM-DEXTROSE 1-4 GM/50ML-% IV SOLN
INTRAVENOUS | Status: AC
  Filled 2017-08-23: qty 50

## 2017-08-23 MED ORDER — HEPARIN (PORCINE) IN NACL 2-0.9 UNIT/ML-% IJ SOLN
INTRAMUSCULAR | Status: AC
  Filled 2017-08-23: qty 1000

## 2017-08-23 MED ORDER — LABETALOL HCL 5 MG/ML IV SOLN: 10.0000 mg | INTRAVENOUS | Status: DC | PRN

## 2017-08-23 MED ORDER — HEPARIN SODIUM (PORCINE) 1000 UNIT/ML IJ SOLN
INTRAMUSCULAR | Status: DC | PRN
  Administered 2017-08-23: 5000 [IU] via INTRAVENOUS

## 2017-08-23 MED ORDER — HYDRALAZINE HCL 20 MG/ML IJ SOLN: 5.0000 mg | INTRAMUSCULAR | Status: DC | PRN

## 2017-08-23 MED ORDER — CEFAZOLIN SODIUM-DEXTROSE 1-4 GM/50ML-% IV SOLN
1.0000 g | Freq: Once | INTRAVENOUS | Status: AC
  Administered 2017-08-23: 1 g via INTRAVENOUS

## 2017-08-23 MED ORDER — SODIUM CHLORIDE 0.9% FLUSH: 3.0000 mL | Freq: Two times a day (BID) | INTRAVENOUS | Status: DC

## 2017-08-23 MED ORDER — ONDANSETRON HCL 4 MG/2ML IJ SOLN: 4.0000 mg | Freq: Four times a day (QID) | INTRAMUSCULAR | Status: DC | PRN

## 2017-08-23 MED ORDER — IOPAMIDOL (ISOVUE-300) INJECTION 61%
INTRAVENOUS | Status: DC | PRN
  Administered 2017-08-23: 55 mL via INTRA_ARTERIAL

## 2017-08-23 MED ORDER — SODIUM CHLORIDE 0.9 % IV SOLN
INTRAVENOUS | Status: DC
  Administered 2017-08-23 (×2): via INTRAVENOUS

## 2017-08-23 MED ORDER — LIDOCAINE HCL (PF) 1 % IJ SOLN
INTRAMUSCULAR | Status: AC
  Filled 2017-08-23: qty 30

## 2017-08-23 MED ORDER — SODIUM CHLORIDE 0.9 % IV SOLN: 250.0000 mL | INTRAVENOUS | Status: DC | PRN

## 2017-08-23 MED ORDER — METHYLPREDNISOLONE SODIUM SUCC 125 MG IJ SOLR: 125.0000 mg | INTRAMUSCULAR | Status: DC | PRN

## 2017-08-23 MED ORDER — FENTANYL CITRATE (PF) 100 MCG/2ML IJ SOLN
INTRAMUSCULAR | Status: DC | PRN
  Administered 2017-08-23 (×2): 50 ug via INTRAVENOUS

## 2017-08-23 MED ORDER — LIDOCAINE-EPINEPHRINE (PF) 1 %-1:200000 IJ SOLN
INTRAMUSCULAR | Status: AC
  Filled 2017-08-23: qty 30

## 2017-08-23 SURGICAL SUPPLY — 17 items
BALLN LUTONIX DCB 6X40X130 (BALLOONS) ×2
BALLN LUTONIX DCB 6X60X130 (BALLOONS) ×2
BALLN ULTRVRSE 7X40X130C (BALLOONS) ×2
BALLOON LUTONIX DCB 6X40X130 (BALLOONS) ×1 IMPLANT
BALLOON LUTONIX DCB 6X60X130 (BALLOONS) ×1 IMPLANT
BALLOON ULTRVRSE 7X40X130C (BALLOONS) ×1 IMPLANT
CATH BEACON 5 .035 65 KMP TIP (CATHETERS) ×2 IMPLANT
CATH PIG 70CM (CATHETERS) ×2 IMPLANT
DEVICE PRESTO INFLATION (MISCELLANEOUS) ×2 IMPLANT
DEVICE STARCLOSE SE CLOSURE (Vascular Products) ×2 IMPLANT
GLIDEWIRE ADV .035X180CM (WIRE) ×2 IMPLANT
PACK ANGIOGRAPHY (CUSTOM PROCEDURE TRAY) ×2 IMPLANT
SHEATH ANL2 6FRX45 HC (SHEATH) ×2 IMPLANT
SHEATH BRITE TIP 5FRX11 (SHEATH) ×2 IMPLANT
TUBING CONTRAST HIGH PRESS 72 (TUBING) ×2 IMPLANT
WIRE J 3MM .035X145CM (WIRE) ×2 IMPLANT
WIRE MAGIC TORQUE 260C (WIRE) ×2 IMPLANT

## 2017-08-23 NOTE — H&P (Signed)
[facility] Admission History & Physical  MRN : [id number]  Jill Hudson is a 82 y.o. ([birth year]) female who presents with chief complaint of No chief complaint on file. [place] Kitchen  History of Present Illness: Patient presents for angiogram of the left lower extremity.  As per her office note about 6 weeks ago, she has reduced ABI on the left pain with activity.  She denies any new ulcerations or infection.  No fever or chills.  Current Facility-Administered Medications  Medication Dose Route Frequency Provider Last Rate Last Dose  . 0.9 %  sodium chloride infusion   Intravenous Continuous Stegmayer, Kimberly A, PA-C 250 mL/hr at 08/23/17 9833    . famotidine (PEPCID) tablet 40 mg  40 mg Oral PRN Stegmayer, Kimberly A, PA-C      . methylPREDNISolone sodium succinate (SOLU-MEDROL) 125 mg/2 mL injection 125 mg  125 mg Intravenous PRN Stegmayer, Kimberly A, PA-C      . ondansetron (ZOFRAN) injection 4 mg  4 mg Intravenous Q6H PRN Stegmayer, Janalyn Harder, PA-C        Past Medical History:  Diagnosis Date  . Anxiety   . Arthritis   . Cancer ([place])    skin  . Diabetes mellitus without complication ([place])   . Hyperlipidemia   . Hypertension   . Mitral and aortic valve disease   . Occasional tremors   . Shingles   . TIA (transient ischemic attack) 01/2017  . UTI (urinary tract infection)   . Vascular disease, peripheral [facility])     Past Surgical History:  Procedure Laterality Date  . CARDIAC CATHETERIZATION  1998   40% LM, 95% Ramus interm  . CATARACT EXTRACTION, BILATERAL    . EYE SURGERY    . PTCA  08/2013   Left common iliac  . PTCA  12/2012   left ext iliac  . TUBAL LIGATION      Social History Social History  Substance Use Topics  . Smoking status: Former Research scientist (life sciences)  . Smokeless tobacco: Never Used  . Alcohol use No    Family History Family History  Problem Relation Age of Onset  . Dementia Mother   . Diabetes Father   No bleeding or clotting  disorders  Allergies  Allergen Reactions  . Saxagliptin Diarrhea  . Atorvastatin Other (See Comments)    Muscle aches  . Codeine Other (See Comments)    Upset stomach  . Epinephrine Other (See Comments)    Unknown  . Ezetimibe Other (See Comments)    myalgias  . Nitrofurantoin Other (See Comments)    Pruitus, rash  . Limonene Rash  . Sulfa Antibiotics Rash and Other (See Comments)    Sore mouth      REVIEW OF SYSTEMS (Negative unless checked)  Constitutional: [] Weight loss  [] Fever  [] Chills Cardiac: [] Chest pain   [] Chest pressure   [] Palpitations   [] Shortness of breath when laying flat   [] Shortness of breath at rest   [] Shortness of breath with exertion. Vascular:  [x] Pain in legs with walking   [] Pain in legs at rest   [] Pain in legs when laying flat   [x] Claudication   [] Pain in feet when walking  [] Pain in feet at rest  [] Pain in feet when laying flat   [] History of DVT   [] Phlebitis   [] Swelling in legs   [] Varicose veins   [] Non-healing ulcers Pulmonary:   [] Uses home oxygen   [] Productive cough   [] Hemoptysis   [] Wheeze  [] COPD   []   Asthma Neurologic:  [x] Dizziness  [] Blackouts   [] Seizures   [] History of stroke   [] History of TIA  [] Aphasia   [] Temporary blindness   [] Dysphagia   [] Weakness or numbness in arms   [] Weakness or numbness in legs Musculoskeletal:  [x] Arthritis   [] Joint swelling   [] Joint pain   [] Low back pain Hematologic:  [] Easy bruising  [] Easy bleeding   [] Hypercoagulable state   [x] Anemic  [] Hepatitis Gastrointestinal:  [] Blood in stool   [] Vomiting blood  [x] Gastroesophageal reflux/heartburn   [] Difficulty swallowing. Genitourinary:  [x] Chronic kidney disease   [] Difficult urination  [] Frequent urination  [] Burning with urination   [] Blood in urine Skin:  [] Rashes   [] Ulcers   [] Wounds Psychological:  [] History of anxiety   []  History of major depression.  Physical Examination  Vitals:   08/23/17 0715 08/23/17 0915  BP: (!) 163/41 (!) 160/52   Pulse: 62 79  Resp: 16 20  Temp: 98.4 F (36.9 C)   SpO2:  97%   There is no height or weight on file to calculate BMI. Gen: WD/WN, NAD Head: [place] /AT, No temporalis wasting.  Ear/Nose/Throat: Hearing grossly intact, nares w/o erythema or drainage, oropharynx w/o Erythema/Exudate,  Eyes: Conjunctiva clear, sclera non-icteric Neck: Trachea midline.  No JVD.  Pulmonary:  Good air movement, respirations not labored, no use of accessory muscles.  Cardiac: RRR, normal S1, S2. Vascular:  Vessel Right Left  Radial Palpable Palpable                          PT Palpable Not Palpable  DP Palpable Not Palpable   Gastrointestinal: soft, non-tender/non-distended. No guarding/reflex.  Musculoskeletal: M/S 5/5 throughout.  Extremities without ischemic changes.  No deformity or atrophy.  Neurologic: Sensation grossly intact in extremities.  Symmetrical.  Speech is fluent. Motor exam as listed above. Psychiatric: Judgment intact, Mood & affect appropriate for pt's clinical situation. Dermatologic: No rashes or ulcers noted.  No cellulitis or open wounds.     CBC Lab Results  Component Value Date   WBC 7.8 06/02/2017   HGB 10.7 (L) 06/02/2017   HCT 33.7 (L) 06/02/2017   MCV 89 06/02/2017   PLT 267 06/02/2017    BMET    Component Value Date/Time   NA 139 04/09/2017 1216   NA 136 01/16/2016 1135   NA 138 05/29/2014 1202   K 4.9 04/09/2017 1216   K 5.1 05/29/2014 1202   CL 105 04/09/2017 1216   CL 106 05/29/2014 1202   CO2 23 04/09/2017 1216   CO2 25 05/29/2014 1202   GLUCOSE 158 (H) 04/09/2017 1216   GLUCOSE 269 (H) 05/29/2014 1202   BUN 27 (H) 08/20/2017 0848   BUN 19 01/16/2016 1135   BUN 19 (H) 05/29/2014 1202   CREATININE 1.17 (H) 08/20/2017 0848   CREATININE 1.10 05/29/2014 1202   CALCIUM 9.4 04/09/2017 1216   CALCIUM 9.1 05/29/2014 1202   GFRNONAA 43 (L) 08/20/2017 0848   GFRNONAA 48 (L) 05/29/2014 1202   GFRAA 50 (L) 08/20/2017 0848   GFRAA 56 (L) 05/29/2014  1202   Estimated Creatinine Clearance: 29.7 mL/min (A) (by C-G formula based on SCr of 1.17 mg/dL (H)).  COAG Lab Results  Component Value Date   INR 1.08 04/09/2017   INR 1.06 02/15/2017   INR 1.0 05/03/2013    Radiology No results found.    Assessment/Plan 1.  PAD with claudication left lower extremity.  Status post multiple previous interventions.  Angiogram to  be performed today.  Risks and benefits are discussed. 2.  CKD stage III.  Extra hydration given and will continue hydration after the procedure.  Contrast will be limited. 3.  Hypertension.  Stable on outpatient and blood pressure control important in reducing the progression of atherosclerotic disease. On appropriate oral medications. 4. DM. Stable on outpatient medications and blood glucose control important in reducing the progression of atherosclerotic disease. Also, involved in wound healing. On appropriate medications.     Leotis Pain, MD  08/23/2017 9:26 AM

## 2017-08-23 NOTE — H&P (Signed)
[facility] History & Physical Update  The patient was interviewed and re-examined.  The patient's previous History and Physical has been reviewed and is unchanged.  There is no change in the plan of care. We plan to proceed with the scheduled procedure.  Leotis Pain, MD  08/23/2017, 8:07 AM

## 2017-08-23 NOTE — Op Note (Signed)
[facility] Percutaneous Study/Intervention Procedural Note   Date of Surgery: 08/23/2017  Surgeon(s):Salvador Bigbee   Assistants:none  Pre-operative Diagnosis: PAD with claudication left lower extremity  Post-operative diagnosis: Same  Procedure(s) Performed: 1. Ultrasound guidance for vascular access right femoral artery 2. Catheter placement into left profunda femoris artery from right femoral approach 3. Aortogram and selective left lower extremity angiogram 4. Percutaneous transluminal angioplasty of right common iliac artery with 6 mm diameter by 4 cm length Lutonix drug-coated angioplasty balloon 5. Percutaneous transluminal angioplasty of left common femoral artery with 6 mm diameter by 6 cm length Lutonix drug-coated angioplasty balloon and 7 mm diameter by 4 cm conventional angioplasty balloon  6. StarClose closure device right femoral artery  EBL: Minimal  Contrast: 55 cc  Fluoro Time: 7.8 minutes  Moderate Conscious Sedation Time: approximately 35 minutes using 3 mg of Versed and 100 Mcg of Fentanyl  Indications: Patient is a 82 y.o.female with a long history of peripheral arterial disease. The patient has noninvasive study showing moderate to severely reduced flow in the left lower extremity. The patient is brought in for angiography for further evaluation and potential treatment. Risks and benefits are discussed and informed consent is obtained  Procedure: The patient was identified and appropriate procedural time out was performed. The patient was then placed supine on the table and prepped and draped in the usual sterile fashion.Moderate conscious sedation was administered during a face to face encounter with the patient throughout the procedure with my supervision of the RN administering medicines and monitoring the patient's vital signs, pulse oximetry, telemetry  and mental status throughout from the start of the procedure until the patient was taken to the recovery room. Ultrasound was used to evaluate the right common femoral artery. It was patent . A digital ultrasound image was acquired. A Seldinger needle was used to access the right common femoral artery under direct ultrasound guidance and a permanent image was performed. A 0.035 J wire was advanced without resistance and a 5Fr sheath was placed. Pigtail catheter was placed into the aorta and an AP aortogram was performed. This demonstrated normal renal arteries. Aorta was small but not stenotic.  Right common iliac artery had about a 65-70% stenosis in its proximal portion with normalization of the external iliac artery.  Left iliac system had no significant stenosis down to the distal left external iliac artery and the proximal common femoral artery at the distal edge of the previously placed stent with about a 70-75% napkin ring like stenosis. I then crossed the aortic bifurcation and advanced to the left femoral head. Selective left lower extremity angiogram was then performed. This demonstrated a near flush occlusion of the left SFA with a widely patent profunda femoris artery.  At the top edge of what appeared to be a previous endarterectomy site which was also the distal edge of the previously placed stent there was a napkin ringlike stenosis of 70-75% in the proximal common femoral artery on the left.  The below-knee popliteal artery then reconstituted and the continuous into the foot with disease of the posterior tibial artery and peroneal arteries. The patient was systemically heparinized.  To allow an up and over sheath the right common iliac lesion was treated with a 6 mm diameter by 4 cm length Lutonix drug-coated angioplasty balloon inflated to 10 atm for 1 minute.  Completion angiogram showed about a 30% stenosis and a 6 [place] Ansell sheath was then placed over the [employer] wire. I then  used a Kumpe catheter and the advantage wire to navigate across the stenosis and confirm intraluminal flow in the profunda femoris artery.  I then exchanged for a Magic torque wire.  Several passes trying to treat the near flush SFA occlusion were made without success prior to electing to treat the common femoral lesion.  I then treated the common femoral lesion with the proximal extent of the balloon going back into the external iliac artery and the distal edge of the balloon above the femoral bifurcation.  A 6 mm diameter by 6 cm length Lutonix drug-coated angioplasty balloon was inflated to 10 atm for 1 minute.  This was slightly undersized and a 7 mm diameter ventral angioplasty balloon was inflated to 8 atm for 1 minute completion angiogram showed about a 15-20% residual stenosis. I elected to terminate the procedure. The sheath was removed and StarClose closure device was deployed in the right femoral artery with excellent hemostatic result. The patient was taken to the recovery room in stable condition having tolerated the procedure well.  Findings:  Aortogram:Renal arteries appeared normal.  Aorta was small but not stenotic.  Right common iliac artery had about a 65-70% stenosis in its proximal portion with normalization of the external iliac artery.  Left iliac system had no significant stenosis down to the distal left external iliac artery and the proximal common femoral artery at the distal edge of the previously placed stent with about a 70-75% napkin ring like stenosis. Left lower Extremity: This demonstrated a near flush occlusion of the left SFA with a widely patent profunda femoris artery.  At the top edge of what appeared to be a previous endarterectomy site which was also the distal edge of the previously placed stent there was a napkin ringlike stenosis of 70-75% in the proximal common femoral artery on the left.  The below-knee popliteal artery then reconstituted  and the continuous into the foot with disease of the posterior tibial artery and peroneal arteries.   Disposition: Patient was taken to the recovery room in stable condition having tolerated the procedure well.  Complications: None  Leotis Pain 08/23/2017 9:30 AM   This note was created with Dragon Medical transcription system. Any errors in dictation are purely unintentional.

## 2017-08-24 ENCOUNTER — Encounter: Payer: Self-pay | Admitting: Vascular Surgery

## 2017-08-24 ENCOUNTER — Telehealth (INDEPENDENT_AMBULATORY_CARE_PROVIDER_SITE_OTHER): Payer: Self-pay

## 2017-08-24 NOTE — Telephone Encounter (Signed)
Patient called wanting to know when to remove the bandage, she had a LLE angio on 08/23/17. Per Dr. Lucky Cowboy the patient should remove the bandage tomorrow and not place anything over the incision.

## 2017-08-25 DIAGNOSIS — E1142 Type 2 diabetes mellitus with diabetic polyneuropathy: Secondary | ICD-10-CM | POA: Diagnosis not present

## 2017-08-25 DIAGNOSIS — G5603 Carpal tunnel syndrome, bilateral upper limbs: Secondary | ICD-10-CM | POA: Diagnosis not present

## 2017-08-25 DIAGNOSIS — Z794 Long term (current) use of insulin: Secondary | ICD-10-CM | POA: Diagnosis not present

## 2017-09-08 DIAGNOSIS — Z79899 Other long term (current) drug therapy: Secondary | ICD-10-CM | POA: Diagnosis not present

## 2017-09-08 DIAGNOSIS — Z5181 Encounter for therapeutic drug level monitoring: Secondary | ICD-10-CM | POA: Diagnosis not present

## 2017-09-08 DIAGNOSIS — E782 Mixed hyperlipidemia: Secondary | ICD-10-CM | POA: Diagnosis not present

## 2017-09-12 ENCOUNTER — Other Ambulatory Visit: Payer: Self-pay | Admitting: Internal Medicine

## 2017-09-12 DIAGNOSIS — IMO0002 Reserved for concepts with insufficient information to code with codable children: Secondary | ICD-10-CM

## 2017-09-12 DIAGNOSIS — E1142 Type 2 diabetes mellitus with diabetic polyneuropathy: Secondary | ICD-10-CM

## 2017-09-12 DIAGNOSIS — E1165 Type 2 diabetes mellitus with hyperglycemia: Secondary | ICD-10-CM

## 2017-09-28 ENCOUNTER — Encounter (INDEPENDENT_AMBULATORY_CARE_PROVIDER_SITE_OTHER): Payer: Self-pay | Admitting: Vascular Surgery

## 2017-09-28 ENCOUNTER — Other Ambulatory Visit (INDEPENDENT_AMBULATORY_CARE_PROVIDER_SITE_OTHER): Payer: Self-pay | Admitting: Vascular Surgery

## 2017-09-28 ENCOUNTER — Ambulatory Visit (INDEPENDENT_AMBULATORY_CARE_PROVIDER_SITE_OTHER): Payer: Medicare Other | Admitting: Vascular Surgery

## 2017-09-28 ENCOUNTER — Ambulatory Visit (INDEPENDENT_AMBULATORY_CARE_PROVIDER_SITE_OTHER): Payer: Medicare Other

## 2017-09-28 VITALS — BP 198/70 | HR 67 | Resp 16 | Wt 121.0 lb

## 2017-09-28 DIAGNOSIS — E1165 Type 2 diabetes mellitus with hyperglycemia: Secondary | ICD-10-CM | POA: Diagnosis not present

## 2017-09-28 DIAGNOSIS — E1142 Type 2 diabetes mellitus with diabetic polyneuropathy: Secondary | ICD-10-CM

## 2017-09-28 DIAGNOSIS — I639 Cerebral infarction, unspecified: Secondary | ICD-10-CM | POA: Diagnosis not present

## 2017-09-28 DIAGNOSIS — I70212 Atherosclerosis of native arteries of extremities with intermittent claudication, left leg: Secondary | ICD-10-CM

## 2017-09-28 DIAGNOSIS — Z9862 Peripheral vascular angioplasty status: Secondary | ICD-10-CM | POA: Diagnosis not present

## 2017-09-28 DIAGNOSIS — I10 Essential (primary) hypertension: Secondary | ICD-10-CM | POA: Diagnosis not present

## 2017-09-28 DIAGNOSIS — Z794 Long term (current) use of insulin: Secondary | ICD-10-CM

## 2017-09-28 DIAGNOSIS — IMO0002 Reserved for concepts with insufficient information to code with codable children: Secondary | ICD-10-CM

## 2017-09-28 DIAGNOSIS — I70213 Atherosclerosis of native arteries of extremities with intermittent claudication, bilateral legs: Secondary | ICD-10-CM

## 2017-09-28 NOTE — Patient Instructions (Signed)

## 2017-09-28 NOTE — Progress Notes (Signed)
MRN : [id number]  Jill Hudson is a 82 y.o. ([birth year]) female who presents with chief complaint of  Chief Complaint  Patient presents with  . Follow-up    3wk  .  History of Present Illness: Patient returns today in follow up of PAD.  She underwent an ago.  She does notice improvement in her claudication symptoms in the left leg.  She still has some claudication although she does notice that she can walk better and the pain is not as severe.  She denies any ulceration or infection. Her ABIs today show improvement in her left ABI on 7 3 and a stable, normal right ABI of 0.94.  Current Outpatient Medications  Medication Sig Dispense Refill  . acetaminophen (TYLENOL) 650 MG CR tablet Take 1,300 mg by mouth every 8 (eight) hours as needed for pain.    Marland Kitchen aspirin 81 MG tablet Take 81 mg by mouth daily.     . B Complex Vitamins (VITAMIN-B COMPLEX) TABS Take 1 tablet by mouth daily.     . B-D ULTRAFINE III SHORT PEN 31G X 8 MM MISC USE AS DIRECTED( APPLY BY DOSE NOT DAILY) 100 each 0  . Biotin 10 MG TABS Take 10 mg by mouth daily.     . Cholecalciferol (VITAMIN D3 PO) Take 1 capsule by mouth 2 (two) times daily.    . clopidogrel (PLAVIX) 75 MG tablet TAKE 1 TABLET BY MOUTH EVERY DAY (Patient taking differently: Take 75 mg by mouth every day) 90 tablet 0  . Cyanocobalamin (RA VITAMIN B-12 TR) 1000 MCG TBCR Take 1,000 mcg by mouth daily.     Marland Kitchen diltiazem (CARTIA XT) 180 MG 24 hr capsule Take 180 mg by mouth 2 (two) times daily.     . insulin aspart (NOVOLOG FLEXPEN) 100 UNIT/ML FlexPen Inject 4 Units into the skin 3 (three) times daily before meals.    . insulin glargine (LANTUS) 100 unit/mL SOPN Inject 12 Units into the skin daily.    Marland Kitchen loratadine (CLARITIN) 10 MG tablet Take 10 mg by mouth daily as needed for allergies.     Marland Kitchen losartan (COZAAR) 50 MG tablet Take 25-50 mg by mouth See admin instructions. Take 50 mg by mouth in the morning and take 25 mg by mouth at bedtime    . metFORMIN  (GLUCOPHAGE-XR) 500 MG 24 hr tablet TAKE 1 TABLET BY MOUTH TWICE DAILY (Patient taking differently: TAKE 500 MG BY MOUTH TWICE DAILY) 180 tablet 3  . Multiple Vitamins-Minerals (PRESERVISION AREDS PO) Take 1 capsule by mouth 2 (two) times daily.     . Omega 3 1000 MG CAPS Take 1,000 mg by mouth daily.     . rosuvastatin (CRESTOR) 10 MG tablet Take 10 mg by mouth daily.   1   No current facility-administered medications for this visit.     Past Medical History:  Diagnosis Date  . Anxiety   . Arthritis   . Cancer ([place])    skin  . Diabetes mellitus without complication ([place])   . Hyperlipidemia   . Hypertension   . Mitral and aortic valve disease   . Occasional tremors   . Shingles   . TIA (transient ischemic attack) 01/2017  . UTI (urinary tract infection)   . Vascular disease, peripheral [place])     Past Surgical History:  Procedure Laterality Date  . CARDIAC CATHETERIZATION  1998   40% LM, 95% Ramus interm  . CATARACT EXTRACTION, BILATERAL    . EYE  SURGERY    . LOWER EXTREMITY ANGIOGRAPHY Left 08/23/2017   Procedure: Lower Extremity Angiography;  Surgeon: Algernon Huxley, MD;  Location: [facility];  Service: Cardiovascular;  Laterality: Left;  . PTCA  08/2013   Left common iliac  . PTCA  12/2012   left ext iliac  . TUBAL LIGATION      Social History Social History   Tobacco Use  . Smoking status: Former Research scientist (life sciences)  . Smokeless tobacco: Never Used  Substance Use Topics  . Alcohol use: No    Alcohol/week: 0.0 oz  . Drug use: No     Family History Family History  Problem Relation Age of Onset  . Dementia Mother   . Diabetes Father   no bleeding or clotting disorders  Allergies  Allergen Reactions  . Saxagliptin Diarrhea  . Atorvastatin Other (See Comments)    Muscle aches  . Codeine Other (See Comments)    Upset stomach  . Epinephrine Other (See Comments)    Unknown  . Ezetimibe Other (See Comments)    myalgias  . Nitrofurantoin Other (See Comments)      Pruitus, rash  . Limonene Rash  . Sulfa Antibiotics Rash and Other (See Comments)    Sore mouth      REVIEW OF SYSTEMS (Negative unless checked)  Constitutional: []Weight loss  []Fever  []Chills Cardiac: []Chest pain   []Chest pressure   []Palpitations   []Shortness of breath when laying flat   []Shortness of breath at rest   [x]Shortness of breath with exertion. Vascular:  [x]Pain in legs with walking   []Pain in legs at rest   []Pain in legs when laying flat   [x]Claudication   []Pain in feet when walking  []Pain in feet at rest  []Pain in feet when laying flat   []History of DVT   []Phlebitis   []Swelling in legs   []Varicose veins   []Non-healing ulcers Pulmonary:   []Uses home oxygen   []Productive cough   []Hemoptysis   []Wheeze  []COPD   []Asthma Neurologic:  []Dizziness  []Blackouts   []Seizures   []History of stroke   [x]History of TIA  []Aphasia   []Temporary blindness   []Dysphagia   []Weakness or numbness in arms   []Weakness or numbness in legs Musculoskeletal:  [x]Arthritis   []Joint swelling   []Joint pain   []Low back pain Hematologic:  []Easy bruising  []Easy bleeding   []Hypercoagulable state   []Anemic   Gastrointestinal:  []Blood in stool   []Vomiting blood  []Gastroesophageal reflux/heartburn   []Abdominal pain Genitourinary:  []Chronic kidney disease   []Difficult urination  []Frequent urination  []Burning with urination   []Hematuria Skin:  []Rashes   []Ulcers   []Wounds Psychological:  [x]History of anxiety   [] History of major depression.  Physical Examination  BP (!) 198/70 (BP Location: Left Arm)   Pulse 67   Resp 16   Wt 121 lb (54.9 kg)   BMI 22.49 kg/m  Gen:  WD/WN, NAD Head: [place]/AT, No temporalis wasting. Ear/Nose/Throat: Hearing grossly intact, nares w/o erythema or drainage, trachea midline Eyes: Conjunctiva clear. Sclera non-icteric Neck: Supple.  No JVD.  Pulmonary:  Good air movement, no use of accessory muscles.  Cardiac: RRR, no  JVD Vascular:  Vessel Right Left  Radial Palpable Palpable                          PT 1+ Palpable 1+ Palpable  DP 1+ Palpable Trace Palpable    Musculoskeletal: M/S 5/5 throughout.  No deformity or atrophy. No edema. Neurologic: Sensation grossly intact in extremities.  Symmetrical.  Speech is fluent.  Psychiatric: Judgment intact, Mood & affect appropriate for pt's clinical situation. Dermatologic: No rashes or ulcers noted.  No cellulitis or open wounds.       Labs Recent Results (from the past 2160 hour(s))  Hemoglobin A1c     Status: None   Collection Time: 07/30/17 12:00 AM  Result Value Ref Range   Hemoglobin A1C 8.4   Sedimentation rate     Status: None   Collection Time: 08/17/17  2:57 PM  Result Value Ref Range   Sed Rate 11 0 - 40 mm/hr  BUN     Status: Abnormal   Collection Time: 08/20/17  8:48 AM  Result Value Ref Range   BUN 27 (H) 6 - 20 mg/dL  Creatinine, serum     Status: Abnormal   Collection Time: 08/20/17  8:48 AM  Result Value Ref Range   Creatinine, Ser 1.17 (H) 0.44 - 1.00 mg/dL   GFR calc non Af Amer 43 (L) >60 mL/min   GFR calc Af Amer 50 (L) >60 mL/min    Comment: (NOTE) The eGFR has been calculated using the CKD EPI equation. This calculation has not been validated in all clinical situations. eGFR's persistently <60 mL/min signify possible Chronic Kidney Disease.     Radiology No results found.   Assessment/Plan  Essential (primary) hypertension blood pressure control important in reducing the progression of atherosclerotic disease. On appropriate oral medications.   Uncontrolled type 2 diabetes mellitus with diabetic polyneuropathy, with long-term current use of insulin (HCC) blood glucose control important in reducing the progression of atherosclerotic disease. Also, involved in wound healing. On appropriate medications.   Atherosclerosis of native arteries of extremity with intermittent claudication (HCC) Her ABIs today  show improvement in her left ABI to 0.73 and a stable, normal right ABI of 0.94.  This is improved and her symptoms are better.  She has a known left SFA occlusion but at current, her symptoms are not lifestyle limiting.  I would plan to check her back in 3-4 months with noninvasive studies or sooner if problems develop in the interim.  Continue the medical regimen including aspirin and Plavix as well as Crestor.    Leotis Pain, MD  09/28/2017 12:23 PM    This note was created with Dragon medical transcription system.  Any errors from dictation are purely unintentional

## 2017-09-28 NOTE — Assessment & Plan Note (Signed)
blood glucose control important in reducing the progression of atherosclerotic disease. Also, involved in wound healing. On appropriate medications.  

## 2017-09-28 NOTE — Assessment & Plan Note (Signed)
blood pressure control important in reducing the progression of atherosclerotic disease. On appropriate oral medications.  

## 2017-09-28 NOTE — Assessment & Plan Note (Signed)
Her ABIs today show improvement in her left ABI to 0.73 and a stable, normal right ABI of 0.94.  This is improved and her symptoms are better.  She has a known left SFA occlusion but at current, her symptoms are not lifestyle limiting.  I would plan to check her back in 3-4 months with noninvasive studies or sooner if problems develop in the interim.  Continue the medical regimen including aspirin and Plavix as well as Crestor.

## 2017-09-29 DIAGNOSIS — G5603 Carpal tunnel syndrome, bilateral upper limbs: Secondary | ICD-10-CM | POA: Diagnosis not present

## 2017-10-11 DIAGNOSIS — Z1231 Encounter for screening mammogram for malignant neoplasm of breast: Secondary | ICD-10-CM | POA: Diagnosis not present

## 2017-10-14 DIAGNOSIS — G5602 Carpal tunnel syndrome, left upper limb: Secondary | ICD-10-CM | POA: Diagnosis not present

## 2017-10-14 DIAGNOSIS — G5601 Carpal tunnel syndrome, right upper limb: Secondary | ICD-10-CM | POA: Diagnosis not present

## 2017-10-14 DIAGNOSIS — Z794 Long term (current) use of insulin: Secondary | ICD-10-CM | POA: Diagnosis not present

## 2017-10-14 DIAGNOSIS — E1142 Type 2 diabetes mellitus with diabetic polyneuropathy: Secondary | ICD-10-CM | POA: Diagnosis not present

## 2017-10-22 DIAGNOSIS — I739 Peripheral vascular disease, unspecified: Secondary | ICD-10-CM | POA: Diagnosis not present

## 2017-10-22 DIAGNOSIS — I10 Essential (primary) hypertension: Secondary | ICD-10-CM | POA: Diagnosis not present

## 2017-10-22 DIAGNOSIS — I2510 Atherosclerotic heart disease of native coronary artery without angina pectoris: Secondary | ICD-10-CM | POA: Diagnosis not present

## 2017-10-22 DIAGNOSIS — M791 Myalgia, unspecified site: Secondary | ICD-10-CM | POA: Diagnosis not present

## 2017-11-05 DIAGNOSIS — E785 Hyperlipidemia, unspecified: Secondary | ICD-10-CM | POA: Diagnosis not present

## 2017-11-05 DIAGNOSIS — E1142 Type 2 diabetes mellitus with diabetic polyneuropathy: Secondary | ICD-10-CM | POA: Diagnosis not present

## 2017-11-05 DIAGNOSIS — E1169 Type 2 diabetes mellitus with other specified complication: Secondary | ICD-10-CM | POA: Diagnosis not present

## 2017-11-05 DIAGNOSIS — Z794 Long term (current) use of insulin: Secondary | ICD-10-CM | POA: Diagnosis not present

## 2017-11-05 DIAGNOSIS — I10 Essential (primary) hypertension: Secondary | ICD-10-CM | POA: Diagnosis not present

## 2017-11-05 DIAGNOSIS — E1159 Type 2 diabetes mellitus with other circulatory complications: Secondary | ICD-10-CM | POA: Diagnosis not present

## 2017-11-11 DIAGNOSIS — Z794 Long term (current) use of insulin: Secondary | ICD-10-CM | POA: Diagnosis not present

## 2017-11-11 DIAGNOSIS — G5601 Carpal tunnel syndrome, right upper limb: Secondary | ICD-10-CM | POA: Diagnosis not present

## 2017-11-11 DIAGNOSIS — E1142 Type 2 diabetes mellitus with diabetic polyneuropathy: Secondary | ICD-10-CM | POA: Diagnosis not present

## 2017-11-11 DIAGNOSIS — G5602 Carpal tunnel syndrome, left upper limb: Secondary | ICD-10-CM | POA: Diagnosis not present

## 2017-11-15 DIAGNOSIS — E1142 Type 2 diabetes mellitus with diabetic polyneuropathy: Secondary | ICD-10-CM | POA: Diagnosis not present

## 2017-11-15 DIAGNOSIS — I739 Peripheral vascular disease, unspecified: Secondary | ICD-10-CM | POA: Diagnosis not present

## 2017-11-15 DIAGNOSIS — Z794 Long term (current) use of insulin: Secondary | ICD-10-CM | POA: Diagnosis not present

## 2017-11-15 DIAGNOSIS — B351 Tinea unguium: Secondary | ICD-10-CM | POA: Diagnosis not present

## 2017-11-23 DIAGNOSIS — R946 Abnormal results of thyroid function studies: Secondary | ICD-10-CM | POA: Diagnosis not present

## 2017-11-23 DIAGNOSIS — Z79899 Other long term (current) drug therapy: Secondary | ICD-10-CM | POA: Diagnosis not present

## 2017-11-23 DIAGNOSIS — E559 Vitamin D deficiency, unspecified: Secondary | ICD-10-CM | POA: Diagnosis not present

## 2017-11-23 DIAGNOSIS — M79601 Pain in right arm: Secondary | ICD-10-CM | POA: Diagnosis not present

## 2017-11-23 DIAGNOSIS — Z5181 Encounter for therapeutic drug level monitoring: Secondary | ICD-10-CM | POA: Diagnosis not present

## 2017-11-23 DIAGNOSIS — I739 Peripheral vascular disease, unspecified: Secondary | ICD-10-CM | POA: Diagnosis not present

## 2017-11-23 DIAGNOSIS — M791 Myalgia, unspecified site: Secondary | ICD-10-CM | POA: Diagnosis not present

## 2017-11-23 DIAGNOSIS — I10 Essential (primary) hypertension: Secondary | ICD-10-CM | POA: Diagnosis not present

## 2017-11-23 DIAGNOSIS — R7989 Other specified abnormal findings of blood chemistry: Secondary | ICD-10-CM | POA: Diagnosis not present

## 2017-11-23 DIAGNOSIS — I2510 Atherosclerotic heart disease of native coronary artery without angina pectoris: Secondary | ICD-10-CM | POA: Diagnosis not present

## 2017-12-02 ENCOUNTER — Telehealth: Payer: Self-pay

## 2017-12-02 NOTE — Telephone Encounter (Signed)
Patient called stating she accidentally took too much Novolog insulin. She normal does 4 units before and after meals. And then 2 units after dinner. She took 12 units this morning.  Informed pt to not take anymore insulin til the 2 unit afternoon dose and to eat a large lunch. She verbalized understanding.

## 2017-12-06 ENCOUNTER — Other Ambulatory Visit: Payer: Self-pay | Admitting: Internal Medicine

## 2017-12-06 DIAGNOSIS — E1165 Type 2 diabetes mellitus with hyperglycemia: Secondary | ICD-10-CM

## 2017-12-06 DIAGNOSIS — IMO0002 Reserved for concepts with insufficient information to code with codable children: Secondary | ICD-10-CM

## 2017-12-06 DIAGNOSIS — E1142 Type 2 diabetes mellitus with diabetic polyneuropathy: Secondary | ICD-10-CM

## 2017-12-06 DIAGNOSIS — Z961 Presence of intraocular lens: Secondary | ICD-10-CM | POA: Diagnosis not present

## 2017-12-06 DIAGNOSIS — H353132 Nonexudative age-related macular degeneration, bilateral, intermediate dry stage: Secondary | ICD-10-CM | POA: Diagnosis not present

## 2017-12-06 DIAGNOSIS — E113393 Type 2 diabetes mellitus with moderate nonproliferative diabetic retinopathy without macular edema, bilateral: Secondary | ICD-10-CM | POA: Diagnosis not present

## 2017-12-09 ENCOUNTER — Other Ambulatory Visit: Payer: Self-pay

## 2017-12-09 ENCOUNTER — Encounter: Payer: Self-pay | Admitting: Internal Medicine

## 2017-12-09 DIAGNOSIS — E1142 Type 2 diabetes mellitus with diabetic polyneuropathy: Secondary | ICD-10-CM

## 2017-12-09 DIAGNOSIS — E1165 Type 2 diabetes mellitus with hyperglycemia: Secondary | ICD-10-CM

## 2017-12-09 DIAGNOSIS — IMO0002 Reserved for concepts with insufficient information to code with codable children: Secondary | ICD-10-CM

## 2017-12-09 LAB — HM DIABETES EYE EXAM

## 2017-12-09 MED ORDER — INSULIN PEN NEEDLE 31G X 8 MM MISC: 3 refills | Status: DC

## 2017-12-20 ENCOUNTER — Other Ambulatory Visit (INDEPENDENT_AMBULATORY_CARE_PROVIDER_SITE_OTHER): Payer: Self-pay | Admitting: Vascular Surgery

## 2017-12-20 ENCOUNTER — Ambulatory Visit (INDEPENDENT_AMBULATORY_CARE_PROVIDER_SITE_OTHER): Payer: Medicare Other

## 2017-12-20 ENCOUNTER — Ambulatory Visit (INDEPENDENT_AMBULATORY_CARE_PROVIDER_SITE_OTHER): Payer: Medicare Other | Admitting: Vascular Surgery

## 2017-12-20 ENCOUNTER — Encounter (INDEPENDENT_AMBULATORY_CARE_PROVIDER_SITE_OTHER): Payer: Self-pay | Admitting: Vascular Surgery

## 2017-12-20 VITALS — BP 141/60 | HR 63 | Resp 16 | Wt 118.4 lb

## 2017-12-20 DIAGNOSIS — K219 Gastro-esophageal reflux disease without esophagitis: Secondary | ICD-10-CM | POA: Diagnosis not present

## 2017-12-20 DIAGNOSIS — I6523 Occlusion and stenosis of bilateral carotid arteries: Secondary | ICD-10-CM

## 2017-12-20 DIAGNOSIS — I70213 Atherosclerosis of native arteries of extremities with intermittent claudication, bilateral legs: Secondary | ICD-10-CM | POA: Diagnosis not present

## 2017-12-20 DIAGNOSIS — Z959 Presence of cardiac and vascular implant and graft, unspecified: Secondary | ICD-10-CM | POA: Diagnosis not present

## 2017-12-20 DIAGNOSIS — E1165 Type 2 diabetes mellitus with hyperglycemia: Secondary | ICD-10-CM | POA: Diagnosis not present

## 2017-12-20 DIAGNOSIS — E1142 Type 2 diabetes mellitus with diabetic polyneuropathy: Secondary | ICD-10-CM | POA: Diagnosis not present

## 2017-12-20 DIAGNOSIS — I2510 Atherosclerotic heart disease of native coronary artery without angina pectoris: Secondary | ICD-10-CM | POA: Diagnosis not present

## 2017-12-20 DIAGNOSIS — Z794 Long term (current) use of insulin: Secondary | ICD-10-CM

## 2017-12-20 DIAGNOSIS — I10 Essential (primary) hypertension: Secondary | ICD-10-CM

## 2017-12-20 DIAGNOSIS — IMO0002 Reserved for concepts with insufficient information to code with codable children: Secondary | ICD-10-CM

## 2017-12-20 DIAGNOSIS — Z95820 Peripheral vascular angioplasty status with implants and grafts: Secondary | ICD-10-CM

## 2017-12-20 DIAGNOSIS — I739 Peripheral vascular disease, unspecified: Secondary | ICD-10-CM

## 2017-12-21 ENCOUNTER — Encounter (INDEPENDENT_AMBULATORY_CARE_PROVIDER_SITE_OTHER): Payer: Self-pay | Admitting: Vascular Surgery

## 2017-12-21 NOTE — Progress Notes (Signed)
MRN : [id number]  Jill Hudson is a 83 y.o. (September 12, 1935) female who presents with chief complaint of  Chief Complaint  Patient presents with  . Follow-up    71yr abi,carotid  .  History of Present Illness: The patient is seen for follow up evaluation of carotid stenosis. The carotid stenosis followed by ultrasound.   The patient denies amaurosis fugax. There is no recent history of TIA symptoms or focal motor deficits. There is no prior documented CVA.  The patient is taking enteric-coated aspirin 81 mg daily.  There is no history of migraine headaches. There is no history of seizures.  The patient has a history of coronary artery disease, no recent episodes of angina or shortness of breath. The patient denies PAD or claudication symptoms. There is a history of hyperlipidemia which is being treated with a statin.      Current Meds  Medication Sig  . acetaminophen (TYLENOL) 650 MG CR tablet Take 1,300 mg by mouth every 8 (eight) hours as needed for pain.  Marland Kitchen aspirin 81 MG tablet Take 81 mg by mouth daily.   . B Complex Vitamins (VITAMIN-B COMPLEX) TABS Take 1 tablet by mouth daily.   . Biotin 10 MG TABS Take 10 mg by mouth daily.   . Cholecalciferol (VITAMIN D3 PO) Take 1 capsule by mouth 2 (two) times daily.  . clopidogrel (PLAVIX) 75 MG tablet TAKE 1 TABLET BY MOUTH EVERY DAY (Patient taking differently: Take 75 mg by mouth every day)  . Cyanocobalamin (RA VITAMIN B-12 TR) 1000 MCG TBCR Take 1,000 mcg by mouth daily.   Marland Kitchen diltiazem (CARTIA XT) 180 MG 24 hr capsule Take 180 mg by mouth 2 (two) times daily.   . insulin aspart (NOVOLOG FLEXPEN) 100 UNIT/ML FlexPen Inject 4 Units into the skin 3 (three) times daily before meals.  . insulin glargine (LANTUS) 100 unit/mL SOPN Inject 12 Units into the skin daily.  . Insulin Pen Needle (B-D ULTRAFINE III SHORT PEN) 31G X 8 MM MISC USE AS DIRECTED( APPLY BY DOSE NOT DAILY)  . loratadine (CLARITIN) 10 MG tablet Take 10 mg by mouth daily  as needed for allergies.   Marland Kitchen losartan (COZAAR) 50 MG tablet Take 25-50 mg by mouth See admin instructions. Take 50 mg by mouth in the morning and take 25 mg by mouth at bedtime  . metFORMIN (GLUCOPHAGE-XR) 500 MG 24 hr tablet TAKE 1 TABLET BY MOUTH TWICE DAILY (Patient taking differently: TAKE 500 MG BY MOUTH TWICE DAILY)  . Multiple Vitamins-Minerals (PRESERVISION AREDS PO) Take 1 capsule by mouth 2 (two) times daily.   . Omega 3 1000 MG CAPS Take 1,000 mg by mouth daily.   . rosuvastatin (CRESTOR) 10 MG tablet Take 10 mg by mouth daily.     Past Medical History:  Diagnosis Date  . Anxiety   . Arthritis   . Cancer ([place])    skin  . Diabetes mellitus without complication ([place])   . Hyperlipidemia   . Hypertension   . Mitral and aortic valve disease   . Occasional tremors   . Shingles   . TIA (transient ischemic attack) 01/2017  . UTI (urinary tract infection)   . Vascular disease, peripheral [facility])     Past Surgical History:  Procedure Laterality Date  . CARDIAC CATHETERIZATION  1998   40% LM, 95% Ramus interm  . CATARACT EXTRACTION, BILATERAL    . EYE SURGERY    . LOWER EXTREMITY ANGIOGRAPHY Left 08/23/2017  Procedure: Lower Extremity Angiography;  Surgeon: Algernon Huxley, MD;  Location: [place] [facility];  Service: Cardiovascular;  Laterality: Left;  . PTCA  08/2013   Left common iliac  . PTCA  12/2012   left ext iliac  . TUBAL LIGATION      Social History Social History   Tobacco Use  . Smoking status: Former Research scientist (life sciences)  . Smokeless tobacco: Never Used  Substance Use Topics  . Alcohol use: No    Alcohol/week: 0.0 oz  . Drug use: No    Family History Family History  Problem Relation Age of Onset  . Dementia Mother   . Diabetes Father     Allergies  Allergen Reactions  . Saxagliptin Diarrhea  . Atorvastatin Other (See Comments)    Muscle aches  . Codeine Other (See Comments)    Upset stomach  . Epinephrine Other (See Comments)    Unknown  . Ezetimibe  Other (See Comments)    myalgias  . Nitrofurantoin Other (See Comments)    Pruitus, rash  . Limonene Rash  . Sulfa Antibiotics Rash and Other (See Comments)    Sore mouth      REVIEW OF SYSTEMS (Negative unless checked)  Constitutional: [] Weight loss  [] Fever  [] Chills Cardiac: [] Chest pain   [] Chest pressure   [] Palpitations   [] Shortness of breath when laying flat   [] Shortness of breath with exertion. Vascular:  [x] Pain in legs with walking   [] Pain in legs at rest  [] History of DVT   [] Phlebitis   [] Swelling in legs   [] Varicose veins   [] Non-healing ulcers Pulmonary:   [] Uses home oxygen   [] Productive cough   [] Hemoptysis   [] Wheeze  [] COPD   [] Asthma Neurologic:  [] Dizziness   [] Seizures   [] History of stroke   [] History of TIA  [] Aphasia   [] Vissual changes   [] Weakness or numbness in arm   [] Weakness or numbness in leg Musculoskeletal:   [] Joint swelling   [] Joint pain   [] Low back pain Hematologic:  [] Easy bruising  [] Easy bleeding   [] Hypercoagulable state   [] Anemic Gastrointestinal:  [] Diarrhea   [] Vomiting  [] Gastroesophageal reflux/heartburn   [] Difficulty swallowing. Genitourinary:  [] Chronic kidney disease   [] Difficult urination  [] Frequent urination   [] Blood in urine Skin:  [] Rashes   [] Ulcers  Psychological:  [] History of anxiety   []  History of major depression.  Physical Examination  Vitals:   12/20/17 1429  BP: (!) 141/60  Pulse: 63  Resp: 16  Weight: 118 lb 6.4 oz (53.7 kg)   Body mass index is 22.01 kg/m. Gen: WD/WN, NAD Head: [place]/AT, No temporalis wasting.  Ear/Nose/Throat: Hearing grossly intact, nares w/o erythema or drainage Eyes: PER, EOMI, sclera nonicteric.  Neck: Supple, no large masses.   Pulmonary:  Good air movement, no audible wheezing bilaterally, no use of accessory muscles.  Cardiac: RRR, no JVD Vascular: bilateral carotid bruit Vessel Right Left  Radial Palpable Palpable  PT Not Palpable Not Palpable  DP Not Palpable Not Palpable    Gastrointestinal: Non-distended. No guarding/no peritoneal signs.  Musculoskeletal: M/S 5/5 throughout.  No deformity or atrophy.  Neurologic: CN 2-12 intact. Symmetrical.  Speech is fluent. Motor exam as listed above. Psychiatric: Judgment intact, Mood & affect appropriate for pt's clinical situation. Dermatologic: No rashes or ulcers noted.  No changes consistent with cellulitis. Lymph : No lichenification or skin changes of chronic lymphedema.  CBC Lab Results  Component Value Date   WBC 7.8 06/02/2017   HGB  10.7 (L) 06/02/2017   HCT 33.7 (L) 06/02/2017   MCV 89 06/02/2017   PLT 267 06/02/2017    BMET    Component Value Date/Time   NA 139 04/09/2017 1216   NA 136 01/16/2016 1135   NA 138 05/29/2014 1202   K 4.9 04/09/2017 1216   K 5.1 05/29/2014 1202   CL 105 04/09/2017 1216   CL 106 05/29/2014 1202   CO2 23 04/09/2017 1216   CO2 25 05/29/2014 1202   GLUCOSE 158 (H) 04/09/2017 1216   GLUCOSE 269 (H) 05/29/2014 1202   BUN 27 (H) 08/20/2017 0848   BUN 19 01/16/2016 1135   BUN 19 (H) 05/29/2014 1202   CREATININE 1.17 (H) 08/20/2017 0848   CREATININE 1.10 05/29/2014 1202   CALCIUM 9.4 04/09/2017 1216   CALCIUM 9.1 05/29/2014 1202   GFRNONAA 43 (L) 08/20/2017 0848   GFRNONAA 48 (L) 05/29/2014 1202   GFRAA 50 (L) 08/20/2017 0848   GFRAA 56 (L) 05/29/2014 1202   CrCl cannot be calculated (Patient's most recent lab result is older than the maximum 21 days allowed.).  COAG Lab Results  Component Value Date   INR 1.08 04/09/2017   INR 1.06 02/15/2017   INR 1.0 05/03/2013    Radiology No results found.  Assessment/Plan 1. Bilateral carotid artery stenosis Recommend:  Given the patient's asymptomatic subcritical stenosis no further invasive testing or surgery at this time.  Continue antiplatelet therapy as prescribed Continue management of CAD, HTN and Hyperlipidemia Healthy heart diet,  encouraged exercise at least 4 times per week Follow up in 12 months  with duplex ultrasound and physical exam   - VAS US CAROTID; Future  2. PAD (peripheral artery disease) (HCC)  Recommend:  The patient has evidence of atherosclerosis of the lower extremities with claudication.  The patient does not voice lifestyle limiting changes at this point in time.  Noninvasive studies do not suggest clinically significant change.  No invasive studies, angiography or surgery at this time The patient should continue walking and begin a more formal exercise program.  The patient should continue antiplatelet therapy and aggressive treatment of the lipid abnormalities  No changes in the patient's medications at this time  The patient should continue wearing graduated compression socks 10-15 mmHg strength to control the mild edema.   - VAS [place] ABI WITH/WO TBI; Future  3. Essential (primary) hypertension Continue antihypertensive medications as already ordered, these medications have been reviewed and there are no changes at this time.   4. Arteriosclerosis of coronary artery Continue cardiac and antihypertensive medications as already ordered and reviewed, no changes at this time.  Continue statin as ordered and reviewed, no changes at this time  Nitrates PRN for chest pain   5. Uncontrolled type 2 diabetes mellitus with diabetic polyneuropathy, with long-term current use of insulin (HCC) Continue hypoglycemic medications as already ordered, these medications have been reviewed and there are no changes at this time.  Hgb A1C to be monitored as already arranged by primary service   6. Gastroesophageal reflux disease, esophagitis presence not specified Continue antihypertensive medications as already ordered, these medications have been reviewed and there are no changes at this time.  Avoidence of caffeine and alcohol  Moderate elevation of the head of the bed     Hortencia Pilar, MD  12/21/2017 10:01 PM

## 2017-12-23 DIAGNOSIS — I739 Peripheral vascular disease, unspecified: Secondary | ICD-10-CM | POA: Diagnosis not present

## 2017-12-23 DIAGNOSIS — I119 Hypertensive heart disease without heart failure: Secondary | ICD-10-CM | POA: Diagnosis not present

## 2017-12-23 DIAGNOSIS — I2510 Atherosclerotic heart disease of native coronary artery without angina pectoris: Secondary | ICD-10-CM | POA: Diagnosis not present

## 2017-12-23 DIAGNOSIS — E7800 Pure hypercholesterolemia, unspecified: Secondary | ICD-10-CM | POA: Diagnosis not present

## 2017-12-28 ENCOUNTER — Other Ambulatory Visit: Payer: Self-pay | Admitting: Internal Medicine

## 2017-12-28 ENCOUNTER — Other Ambulatory Visit (INDEPENDENT_AMBULATORY_CARE_PROVIDER_SITE_OTHER): Payer: Self-pay | Admitting: Vascular Surgery

## 2017-12-28 MED ORDER — METFORMIN HCL ER 500 MG PO TB24: 500.0000 mg | ORAL_TABLET | Freq: Two times a day (BID) | ORAL | 3 refills | Status: DC

## 2017-12-31 ENCOUNTER — Ambulatory Visit (INDEPENDENT_AMBULATORY_CARE_PROVIDER_SITE_OTHER): Payer: Medicare Other | Admitting: Vascular Surgery

## 2017-12-31 ENCOUNTER — Encounter (INDEPENDENT_AMBULATORY_CARE_PROVIDER_SITE_OTHER): Payer: Medicare Other

## 2018-01-04 ENCOUNTER — Other Ambulatory Visit: Payer: Self-pay

## 2018-01-04 MED ORDER — METFORMIN HCL ER 500 MG PO TB24: 500.0000 mg | ORAL_TABLET | Freq: Two times a day (BID) | ORAL | 3 refills | Status: DC

## 2018-01-05 ENCOUNTER — Other Ambulatory Visit: Payer: Self-pay | Admitting: Internal Medicine

## 2018-01-10 ENCOUNTER — Inpatient Hospital Stay: Admission: RE | Admit: 2018-01-10 | Payer: Medicare Other | Source: Ambulatory Visit

## 2018-01-12 ENCOUNTER — Other Ambulatory Visit (INDEPENDENT_AMBULATORY_CARE_PROVIDER_SITE_OTHER): Payer: Self-pay | Admitting: Vascular Surgery

## 2018-01-18 DIAGNOSIS — I119 Hypertensive heart disease without heart failure: Secondary | ICD-10-CM | POA: Diagnosis not present

## 2018-01-18 DIAGNOSIS — I739 Peripheral vascular disease, unspecified: Secondary | ICD-10-CM | POA: Diagnosis not present

## 2018-01-18 DIAGNOSIS — E7800 Pure hypercholesterolemia, unspecified: Secondary | ICD-10-CM | POA: Diagnosis not present

## 2018-01-18 DIAGNOSIS — I2510 Atherosclerotic heart disease of native coronary artery without angina pectoris: Secondary | ICD-10-CM | POA: Diagnosis not present

## 2018-01-19 ENCOUNTER — Ambulatory Visit
Admission: RE | Admit: 2018-01-19 | Payer: Medicare Other | Source: Ambulatory Visit | Admitting: Unknown Physician Specialty

## 2018-01-19 ENCOUNTER — Encounter: Admission: RE | Payer: Self-pay | Source: Ambulatory Visit

## 2018-01-19 SURGERY — BILATERAL CARPAL TUNNEL RELEASE
Anesthesia: Choice | Laterality: Bilateral

## 2018-01-24 ENCOUNTER — Other Ambulatory Visit: Payer: Self-pay

## 2018-01-24 ENCOUNTER — Encounter: Payer: Self-pay | Admitting: Emergency Medicine

## 2018-01-24 ENCOUNTER — Ambulatory Visit
Admission: EM | Admit: 2018-01-24 | Discharge: 2018-01-24 | Disposition: A | Discharge status: Home / Self Care | Payer: Medicare Other | Attending: Family Medicine | Admitting: Family Medicine

## 2018-01-24 ENCOUNTER — Ambulatory Visit: Payer: Medicare Other

## 2018-01-24 DIAGNOSIS — Z833 Family history of diabetes mellitus: Secondary | ICD-10-CM | POA: Insufficient documentation

## 2018-01-24 DIAGNOSIS — Z794 Long term (current) use of insulin: Secondary | ICD-10-CM | POA: Insufficient documentation

## 2018-01-24 DIAGNOSIS — Z8673 Personal history of transient ischemic attack (TIA), and cerebral infarction without residual deficits: Secondary | ICD-10-CM | POA: Diagnosis not present

## 2018-01-24 DIAGNOSIS — I2510 Atherosclerotic heart disease of native coronary artery without angina pectoris: Secondary | ICD-10-CM | POA: Diagnosis not present

## 2018-01-24 DIAGNOSIS — E1122 Type 2 diabetes mellitus with diabetic chronic kidney disease: Secondary | ICD-10-CM | POA: Diagnosis not present

## 2018-01-24 DIAGNOSIS — M546 Pain in thoracic spine: Secondary | ICD-10-CM | POA: Diagnosis not present

## 2018-01-24 DIAGNOSIS — M5136 Other intervertebral disc degeneration, lumbar region: Secondary | ICD-10-CM | POA: Diagnosis not present

## 2018-01-24 DIAGNOSIS — M5441 Lumbago with sciatica, right side: Secondary | ICD-10-CM | POA: Diagnosis not present

## 2018-01-24 DIAGNOSIS — M549 Dorsalgia, unspecified: Secondary | ICD-10-CM

## 2018-01-24 DIAGNOSIS — M791 Myalgia, unspecified site: Secondary | ICD-10-CM | POA: Diagnosis not present

## 2018-01-24 DIAGNOSIS — Z9889 Other specified postprocedural states: Secondary | ICD-10-CM | POA: Diagnosis not present

## 2018-01-24 DIAGNOSIS — K219 Gastro-esophageal reflux disease without esophagitis: Secondary | ICD-10-CM | POA: Insufficient documentation

## 2018-01-24 DIAGNOSIS — R946 Abnormal results of thyroid function studies: Secondary | ICD-10-CM | POA: Insufficient documentation

## 2018-01-24 DIAGNOSIS — Z7982 Long term (current) use of aspirin: Secondary | ICD-10-CM | POA: Diagnosis not present

## 2018-01-24 DIAGNOSIS — G5603 Carpal tunnel syndrome, bilateral upper limbs: Secondary | ICD-10-CM | POA: Diagnosis not present

## 2018-01-24 DIAGNOSIS — I70219 Atherosclerosis of native arteries of extremities with intermittent claudication, unspecified extremity: Secondary | ICD-10-CM | POA: Diagnosis not present

## 2018-01-24 DIAGNOSIS — E1142 Type 2 diabetes mellitus with diabetic polyneuropathy: Secondary | ICD-10-CM | POA: Insufficient documentation

## 2018-01-24 DIAGNOSIS — E782 Mixed hyperlipidemia: Secondary | ICD-10-CM | POA: Diagnosis not present

## 2018-01-24 DIAGNOSIS — Z881 Allergy status to other antibiotic agents status: Secondary | ICD-10-CM | POA: Insufficient documentation

## 2018-01-24 DIAGNOSIS — Z79899 Other long term (current) drug therapy: Secondary | ICD-10-CM | POA: Diagnosis not present

## 2018-01-24 DIAGNOSIS — W06XXXA Fall from bed, initial encounter: Secondary | ICD-10-CM | POA: Insufficient documentation

## 2018-01-24 DIAGNOSIS — M50322 Other cervical disc degeneration at C5-C6 level: Secondary | ICD-10-CM | POA: Diagnosis not present

## 2018-01-24 DIAGNOSIS — I1310 Hypertensive heart and chronic kidney disease without heart failure, with stage 1 through stage 4 chronic kidney disease, or unspecified chronic kidney disease: Secondary | ICD-10-CM | POA: Diagnosis not present

## 2018-01-24 DIAGNOSIS — Z885 Allergy status to narcotic agent status: Secondary | ICD-10-CM | POA: Insufficient documentation

## 2018-01-24 DIAGNOSIS — M542 Cervicalgia: Secondary | ICD-10-CM | POA: Diagnosis not present

## 2018-01-24 DIAGNOSIS — E1165 Type 2 diabetes mellitus with hyperglycemia: Secondary | ICD-10-CM | POA: Insufficient documentation

## 2018-01-24 DIAGNOSIS — E1151 Type 2 diabetes mellitus with diabetic peripheral angiopathy without gangrene: Secondary | ICD-10-CM | POA: Insufficient documentation

## 2018-01-24 DIAGNOSIS — Z87891 Personal history of nicotine dependence: Secondary | ICD-10-CM | POA: Insufficient documentation

## 2018-01-24 DIAGNOSIS — Z888 Allergy status to other drugs, medicaments and biological substances status: Secondary | ICD-10-CM | POA: Insufficient documentation

## 2018-01-24 DIAGNOSIS — Z882 Allergy status to sulfonamides status: Secondary | ICD-10-CM | POA: Diagnosis not present

## 2018-01-24 DIAGNOSIS — F419 Anxiety disorder, unspecified: Secondary | ICD-10-CM | POA: Insufficient documentation

## 2018-01-24 DIAGNOSIS — Z820 Family history of epilepsy and other diseases of the nervous system: Secondary | ICD-10-CM | POA: Insufficient documentation

## 2018-01-24 DIAGNOSIS — S299XXA Unspecified injury of thorax, initial encounter: Secondary | ICD-10-CM | POA: Diagnosis not present

## 2018-01-24 NOTE — ED Provider Notes (Signed)
[facility] ____________________________________________  Time seen: Approximately 2:30 PM  I have reviewed the triage vital signs and the nursing notes.   HISTORY  Chief Complaint Neck Pain   HPI Jill Hudson is a 83 y.o. female presenting for evaluation of upper back and neck pain that is been present since after she fell.  Patient reports approximately February 8 is when she fell.  States that she was taking a nap in the afternoon in her bed, and rolled over and accidentally rolled off of her bed.  States she had some storage containers with clothing beside her bed and she believes she hit her back on those.  States she immediately stood up.  States she did notice very slight pain in her back initially, but reports more pain a few days later.  Denies any loss of consciousness, direct head injury, confusion, vision changes, syncope or near syncope.  Denies any immediate headache or atypical headaches since.  Continues to remain active.  Continues to eat and drink well.  States she has been busy taking care of her husband who is been sick which is why she is just now coming to have evaluated.  States currently no neck or back pain when sitting still.  States pain is only present to neck and back with movement.  Has taken occasional Tylenol which helps some, no full resolution.  Denies taking other over-the-counter medications.  Denies any paresthesias, decreased hand grips or pain radiation.  Denies chest pain, shortness of breath, abdominal pain, dysuria, extremity pain, extremity swelling or rash. Denies recent sickness.  States this is the first time she has ever fallen.  Patient does reports that she believes she has had some mild rotator cuff issues to her right shoulder since before the fall, denies any acute worsening.  Also reports that she has had some previous low back pain and right sciatica, denies currently.  [name], MD: PCP   Past Medical History:    Diagnosis Date  . Anxiety   . Arthritis   . Cancer ([place])    skin  . Diabetes mellitus without complication ([place])   . Hyperlipidemia   . Hypertension   . Mitral and aortic valve disease   . Occasional tremors   . Shingles   . TIA (transient ischemic attack) 01/2017  . UTI (urinary tract infection)   . Vascular disease, peripheral [facility])     Patient Active Problem List   Diagnosis Date Noted  . Leg pain 07/03/2017  . Carpal tunnel syndrome on both sides 05/05/2017  . Myalgia due to HMG CoA reductase inhibitor 05/05/2017  . CVA (cerebral vascular accident) ([place]) 02/15/2017  . Degenerative disc disease, lumbar 12/21/2016  . Atherosclerosis of native arteries of extremity with intermittent claudication ([place]) 12/20/2016  . Bilateral carotid artery stenosis 12/20/2016  . Hip bursitis 05/18/2016  . Elevated TSH 01/18/2016  . Chronic renal insufficiency, stage III (moderate) ([place]) 01/16/2016  . Senile ecchymosis 01/16/2016  . Uncontrolled type 2 diabetes mellitus with diabetic polyneuropathy, with long-term current use of insulin ([place]) 09/16/2015  . GERD (gastroesophageal reflux disease) 07/24/2015  . PAD (peripheral artery disease) ([place]) 05/17/2015  . Neoplasm of uncertain behavior of skin 05/17/2015  . Retinopathy, diabetic, proliferative ([place]) 04/11/2015  . Mixed hyperlipidemia 04/11/2015  . Essential (primary) hypertension 04/11/2015  . Generalized OA 04/11/2015  . Arteriosclerosis of coronary artery 05/29/2013  . Hypertensive heart disease without CHF 05/29/2013    Past Surgical History:  Procedure Laterality Date  .  CARDIAC CATHETERIZATION  1998   40% LM, 95% Ramus interm  . CATARACT EXTRACTION, BILATERAL    . EYE SURGERY    . LOWER EXTREMITY ANGIOGRAPHY Left 08/23/2017   Procedure: Lower Extremity Angiography;  Surgeon: Algernon Huxley, MD;  Location: [place] [facility];  Service: Cardiovascular;  Laterality: Left;  . PTCA  08/2013   Left common iliac  . PTCA  12/2012    left ext iliac  . TUBAL LIGATION       No current facility-administered medications for this encounter.   Current Outpatient Medications:  .  acetaminophen (TYLENOL) 650 MG CR tablet, Take 1,300 mg by mouth every 8 (eight) hours as needed for pain., Disp: , Rfl:  .  aspirin 81 MG tablet, Take 81 mg by mouth daily. , Disp: , Rfl:  .  B Complex Vitamins (VITAMIN-B COMPLEX) TABS, Take 1 tablet by mouth daily. , Disp: , Rfl:  .  Biotin 10 MG TABS, Take 10 mg by mouth daily. , Disp: , Rfl:  .  Cholecalciferol (VITAMIN D3 PO), Take 1 capsule by mouth 2 (two) times daily., Disp: , Rfl:  .  clopidogrel (PLAVIX) 75 MG tablet, TAKE ONE TABLET BY MOUTH EVERY DAY, Disp: 30 tablet, Rfl: 11 .  Cyanocobalamin (RA VITAMIN B-12 TR) 1000 MCG TBCR, Take 1,000 mcg by mouth daily. , Disp: , Rfl:  .  diltiazem (CARTIA XT) 180 MG 24 hr capsule, Take 180 mg by mouth 2 (two) times daily. , Disp: , Rfl:  .  insulin aspart (NOVOLOG FLEXPEN) 100 UNIT/ML FlexPen, Inject 4 Units into the skin 3 (three) times daily before meals., Disp: , Rfl:  .  insulin glargine (LANTUS) 100 unit/mL SOPN, Inject 12 Units into the skin daily., Disp: , Rfl:  .  Insulin Pen Needle (B-D ULTRAFINE III SHORT PEN) 31G X 8 MM MISC, USE AS DIRECTED( APPLY BY DOSE NOT DAILY), Disp: 100 each, Rfl: 3 .  losartan (COZAAR) 50 MG tablet, Take 25-50 mg by mouth See admin instructions. Take 50 mg by mouth in the morning and take 25 mg by mouth at bedtime, Disp: , Rfl:  .  metFORMIN (GLUCOPHAGE-XR) 500 MG 24 hr tablet, TAKE ONE TABLET BY MOUTH TWICE DAILY, Disp: 60 tablet, Rfl: 11 .  Multiple Vitamins-Minerals (PRESERVISION AREDS PO), Take 1 capsule by mouth 2 (two) times daily. , Disp: , Rfl:  .  Omega 3 1000 MG CAPS, Take 1,000 mg by mouth daily. , Disp: , Rfl:  .  rosuvastatin (CRESTOR) 10 MG tablet, Take 10 mg by mouth daily. , Disp: , Rfl: 1 .  loratadine (CLARITIN) 10 MG tablet, Take 10 mg by mouth daily as needed for allergies. , Disp: , Rfl:    Allergies Saxagliptin; Atorvastatin; Codeine; Epinephrine; Ezetimibe; Nitrofurantoin; Limonene; and Sulfa antibiotics  Family History  Problem Relation Age of Onset  . Dementia Mother   . Diabetes Father     Social History Social History   Tobacco Use  . Smoking status: Former Research scientist (life sciences)  . Smokeless tobacco: Never Used  Substance Use Topics  . Alcohol use: No    Alcohol/week: 0.0 oz  . Drug use: No    Review of Systems Constitutional: No fever/chills Eyes: No visual changes. ENT: No sore throat. Cardiovascular: Denies chest pain. Respiratory: Denies shortness of breath. Gastrointestinal: No abdominal pain.  No nausea, no vomiting.  No diarrhea.   Genitourinary: Negative for dysuria. Musculoskeletal: Positive for neck and back pain Skin: Negative for rash.   ____________________________________________  PHYSICAL EXAM:  VITAL SIGNS: ED Triage Vitals [01/24/18 1400]  Enc Vitals Group     BP (!) 156/66     Pulse Rate (!) 58     Resp 16     Temp 97.7 F (36.5 C)     Temp Source Oral     SpO2 100 %     Weight 117 lb (53.1 kg)     Height 5' 1.5" (1.562 m)     Head Circumference      Peak Flow      Pain Score 8     Pain Loc      Pain Edu?      Excl. in [place]?     Constitutional: Alert and oriented. Well appearing and in no acute distress. Eyes: Conjunctivae are normal. PERRL.  ENT      Head: Normocephalic and atraumatic.      Nose: No congestion/rhinnorhea.      Mouth/Throat: Mucous membranes are moist. Neck: No stridor. Supple without meningismus.  Cardiovascular: Normal rate, regular rhythm. Grossly normal heart sounds.  Good peripheral circulation. Respiratory: Normal respiratory effort without tachypnea nor retractions. Breath sounds are clear and equal bilaterally. No wheezes, rales, rhonchi. Musculoskeletal: No midline lumbar tenderness to palpation.  Bilateral distal radial pulses equal and easily palpated.  Bilateral hand grip strong and equal.       Right lower leg:  No tenderness or edema.      Left lower leg:  No tenderness or edema.  Except: Minimal lower cervical tenderness at C6 midline, bilateral mild trapezius tenderness, mild to moderate upper thoracic around T 3-4 mild to moderate midline tenderness, no ecchymosis, no erythema, no edema, full cervical range of motion present, pain with cervical right and left rotation mild. Neurologic:  Normal speech and language. No gross focal neurologic deficits are appreciated. Speech is normal. No gait instability.  Negative pronator drift.  No paresthesias to bilateral upper extremities noted. Skin:  Skin is warm, dry and intact. No rash noted. Psychiatric: Mood and affect are normal. Speech and behavior are normal. Patient exhibits appropriate insight and judgment   ___________________________________________   LABS (all labs ordered are listed, but only abnormal results are displayed)  Labs Reviewed - No data to display  RADIOLOGY  Dg Cervical Spine Complete  Result Date: 01/24/2018 CLINICAL DATA:  Fall 2 weeks ago with persistent neck pain. EXAM: CERVICAL SPINE - COMPLETE 4+ VIEW COMPARISON:  None. FINDINGS: 2 mm anterolisthesis at C4-5 where there is degenerative facet spurring. Facet spurring also noted at C3-4 on the left. Advanced degenerative disc narrowing at C5-6 and C6-7. No bony foraminal stenosis. No visible fracture deformity or prevertebral thickening. No acute finding in the apical lungs. Prominent atherosclerotic calcification. IMPRESSION: 1. No visible fracture deformity. 2. Cervical facet degeneration with C4-5 anterolisthesis. C5-6 and C6-7 disc degeneration. Electronically Signed   By: Monte Fantasia M.D.   On: 01/24/2018 15:02   Dg Thoracic Spine 2 View  Result Date: 01/24/2018 CLINICAL DATA:  Fall.  Pain. EXAM: THORACIC SPINE 2 VIEWS COMPARISON:  None. FINDINGS: There is levoscoliosis at L1-2. Degenerative disc disease in the upper lumbar spine on the right side is  noted. There is slight dextroscoliosis in the upper thoracic spine. No vertebral compression deformity. IMPRESSION: No acute bony pathology.  Chronic changes. Electronically Signed   By: Marybelle Killings M.D.   On: 01/24/2018 15:01   ____________________________________________   PROCEDURES Procedures   INITIAL IMPRESSION / ASSESSMENT AND PLAN / ED COURSE  Pertinent labs & imaging results that were available during my care of the patient were reviewed by me and considered in my medical decision making (see chart for details).  Very well-appearing patient.  No acute distress.  Presenting for evaluation of upper back and neck pain after accidentally rolling out of her bed.  No focal neurological deficits.  Cervical and thoracic imaging obtained.  Results as above per radiologist, per radiologist no acute bony pathology, chronic changes without visible fracture deformity.  Discussed this in detail with patient.  Encourage supportive care.  Follow-up with primary as needed.  Discussed follow up and return parameters including no resolution or any worsening concerns. Patient verbalized understanding and agreed to plan.   ____________________________________________   FINAL CLINICAL IMPRESSION(S) / ED DIAGNOSES  Final diagnoses:  Neck pain  Upper back pain     ED Discharge Orders    None       Note: This dictation was prepared with Dragon dictation along with smaller phrase technology. Any transcriptional errors that result from this process are unintentional.         Marylene Land, NP 01/24/18 1536

## 2018-01-24 NOTE — ED Triage Notes (Addendum)
Patient in today c/o neck pain after falling ~ Feb. 8. Patient states it hurts to turn her head to the left.

## 2018-01-24 NOTE — Discharge Instructions (Signed)
Stretch. Rest. Drink plenty of fluids.   Follow up with your primary care physician this week as needed. Return to Urgent care for new or worsening concerns.

## 2018-01-26 ENCOUNTER — Other Ambulatory Visit: Payer: Self-pay | Admitting: Internal Medicine

## 2018-02-08 DIAGNOSIS — I119 Hypertensive heart disease without heart failure: Secondary | ICD-10-CM | POA: Diagnosis not present

## 2018-02-08 DIAGNOSIS — Z789 Other specified health status: Secondary | ICD-10-CM | POA: Diagnosis not present

## 2018-02-08 DIAGNOSIS — Z79899 Other long term (current) drug therapy: Secondary | ICD-10-CM | POA: Diagnosis not present

## 2018-02-08 DIAGNOSIS — I10 Essential (primary) hypertension: Secondary | ICD-10-CM | POA: Diagnosis not present

## 2018-02-08 DIAGNOSIS — I739 Peripheral vascular disease, unspecified: Secondary | ICD-10-CM | POA: Diagnosis not present

## 2018-02-08 DIAGNOSIS — Z5181 Encounter for therapeutic drug level monitoring: Secondary | ICD-10-CM | POA: Diagnosis not present

## 2018-02-08 DIAGNOSIS — E7800 Pure hypercholesterolemia, unspecified: Secondary | ICD-10-CM | POA: Diagnosis not present

## 2018-02-08 DIAGNOSIS — I2510 Atherosclerotic heart disease of native coronary artery without angina pectoris: Secondary | ICD-10-CM | POA: Diagnosis not present

## 2018-02-09 ENCOUNTER — Other Ambulatory Visit: Payer: Self-pay | Admitting: Internal Medicine

## 2018-02-09 ENCOUNTER — Ambulatory Visit (INDEPENDENT_AMBULATORY_CARE_PROVIDER_SITE_OTHER): Payer: Medicare Other | Admitting: Internal Medicine

## 2018-02-09 ENCOUNTER — Encounter: Payer: Self-pay | Admitting: Internal Medicine

## 2018-02-09 VITALS — BP 142/68 | HR 66 | Ht 61.5 in | Wt 120.0 lb

## 2018-02-09 DIAGNOSIS — M62838 Other muscle spasm: Secondary | ICD-10-CM | POA: Diagnosis not present

## 2018-02-09 DIAGNOSIS — I6523 Occlusion and stenosis of bilateral carotid arteries: Secondary | ICD-10-CM

## 2018-02-09 MED ORDER — BACLOFEN 10 MG PO TABS: 10.0000 mg | ORAL_TABLET | Freq: Three times a day (TID) | ORAL | 0 refills | Status: DC

## 2018-02-09 NOTE — Progress Notes (Signed)
Date:  02/09/2018   Name:  Jill Hudson   DOB:  1934-12-29   MRN:  [id number]   Chief Complaint: Neck Pain (Seen UC about 2 weeks ago. Was told by Xray has arthritis. Hit her neck in bed in February and neck hasn't felt right since. Aching in neck and pain radiates in back. hurts to turn head left and right. )  Neck Pain   This is a new problem. The current episode started 1 to 4 weeks ago. The problem occurs constantly. The problem has been unchanged. The pain is associated with a fall. The pain is present in the occipital region. The quality of the pain is described as cramping and shooting. The pain is at a severity of 3/10. The pain is moderate. The symptoms are aggravated by twisting and bending. Pertinent negatives include no chest pain, fever, headaches, numbness, pain with swallowing, visual change or weakness. She has tried heat and NSAIDs for the symptoms. The treatment provided mild relief.  Also had xrays at [facility] - see report.  No fracture or dislocation - degenerative changes with bone spurring and mild anterolisthesis C2-3.   Review of Systems  Constitutional: Negative for chills, fatigue and fever.  Respiratory: Negative for chest tightness and shortness of breath.   Cardiovascular: Negative for chest pain.  Musculoskeletal: Positive for neck pain and neck stiffness.  Neurological: Negative for dizziness, tremors, weakness, numbness and headaches.    Patient Active Problem List   Diagnosis Date Noted  . Leg pain 07/03/2017  . Carpal tunnel syndrome on both sides 05/05/2017  . Myalgia due to HMG CoA reductase inhibitor 05/05/2017  . CVA (cerebral vascular accident) ([place]) 02/15/2017  . Degenerative disc disease, lumbar 12/21/2016  . Atherosclerosis of native arteries of extremity with intermittent claudication ([place]) 12/20/2016  . Bilateral carotid artery stenosis 12/20/2016  . Hip bursitis 05/18/2016  . Elevated TSH 01/18/2016  . Chronic renal insufficiency, stage III  (moderate) ([place]) 01/16/2016  . Senile ecchymosis 01/16/2016  . Uncontrolled type 2 diabetes mellitus with diabetic polyneuropathy, with long-term current use of insulin ([place]) 09/16/2015  . GERD (gastroesophageal reflux disease) 07/24/2015  . PAD (peripheral artery disease) ([place]) 05/17/2015  . Neoplasm of uncertain behavior of skin 05/17/2015  . Retinopathy, diabetic, proliferative ([place]) 04/11/2015  . Mixed hyperlipidemia 04/11/2015  . Essential (primary) hypertension 04/11/2015  . Generalized OA 04/11/2015  . Arteriosclerosis of coronary artery 05/29/2013  . Hypertensive heart disease without CHF 05/29/2013    Prior to Admission medications   Medication Sig Start Date End Date Taking? Authorizing Provider  acetaminophen (TYLENOL) 650 MG CR tablet Take 1,300 mg by mouth every 8 (eight) hours as needed for pain.   Yes [provider]  aspirin 81 MG tablet Take 81 mg by mouth daily.    Yes [provider]  B Complex Vitamins (VITAMIN-B COMPLEX) TABS Take 1 tablet by mouth daily.    Yes [provider]  Biotin 10 MG TABS Take 10 mg by mouth daily.    Yes [provider]  chlorthalidone (HYGROTON) 25 MG tablet Take 25 mg by mouth daily.   Yes [provider]  Cholecalciferol (VITAMIN D3 PO) Take 1 capsule by mouth 2 (two) times daily.   Yes [provider]  clopidogrel (PLAVIX) 75 MG tablet TAKE ONE TABLET BY MOUTH EVERY DAY 01/13/18  Yes Schnier, Dolores Lory, MD  Cyanocobalamin (RA VITAMIN B-12 TR) 1000 MCG TBCR Take 1,000 mcg by mouth daily.    Yes  [provider]  diltiazem (CARTIA XT) 180 MG 24 hr capsule Take 180 mg by mouth 2 (two) times daily.    Yes [provider]  insulin aspart (NOVOLOG FLEXPEN) 100 UNIT/ML FlexPen Inject 6 Units into the skin 3 (three) times daily before meals.  03/26/17  Yes [provider]  insulin glargine (LANTUS) 100 unit/mL SOPN Inject 12 Units into the skin daily.   Yes [provider]  Insulin Pen Needle (B-D ULTRAFINE III SHORT PEN) 31G X 8 MM MISC USE AS DIRECTED( APPLY BY DOSE NOT DAILY) 12/09/17  Yes [name], MD  loratadine (CLARITIN) 10 MG tablet Take 10 mg by mouth daily as needed for allergies.    Yes [provider]  losartan (COZAAR) 50 MG tablet Take 50 mg by mouth 2 (two) times daily. Take 50 mg by mouth in the morning and take 25 mg by mouth at bedtime 04/06/17 04/06/18 Yes [provider]  metFORMIN (GLUCOPHAGE-XR) 500 MG 24 hr tablet TAKE ONE TABLET BY MOUTH TWICE DAILY 01/26/18  Yes [name], MD  Multiple Vitamins-Minerals (PRESERVISION AREDS PO) Take 1 capsule by mouth 2 (two) times daily.    Yes [provider]  Omega 3 1000 MG CAPS Take 1,000 mg by mouth daily.  12/12/09  Yes [provider]  rosuvastatin (CRESTOR) 10 MG tablet Take 10 mg by mouth daily.  04/06/17  Yes [provider]    Allergies  Allergen Reactions  . Saxagliptin Diarrhea  . Atorvastatin Other (See Comments)    Muscle aches  . Codeine Other (See Comments)    Upset stomach  . Epinephrine Other (See Comments)    Unknown  . Ezetimibe Other (See Comments)    myalgias  . Nitrofurantoin Other (See Comments)    Pruitus, rash  . Limonene Rash  . Sulfa Antibiotics Rash and Other (See Comments)    Sore mouth     Past Surgical History:  Procedure Laterality Date  . CARDIAC CATHETERIZATION  1998   40% LM, 95% Ramus interm  . CATARACT EXTRACTION, BILATERAL    . EYE SURGERY    . LOWER EXTREMITY ANGIOGRAPHY Left 08/23/2017   Procedure: Lower Extremity Angiography;  Surgeon: Algernon Huxley, MD;  Location: [place] [facility];  Service: Cardiovascular;  Laterality: Left;  . PTCA  08/2013   Left common iliac  . PTCA  12/2012   left ext iliac  . TUBAL LIGATION      Social History   Tobacco Use  . Smoking status: Former Research scientist (life sciences)  . Smokeless tobacco: Never Used  Substance Use Topics  . Alcohol use: No     Alcohol/week: 0.0 oz  . Drug use: No     Medication list has been reviewed and updated.  PHQ 2/9 Scores 06/02/2017 06/26/2016 05/17/2015  PHQ - 2 Score 1 0 0    Physical Exam  Constitutional: She is oriented to person, place, and time. She appears well-developed. No distress.  HENT:  Head: Normocephalic and atraumatic.  Cardiovascular: Normal rate and regular rhythm.  Pulmonary/Chest: Effort normal and breath sounds normal. No respiratory distress.  Musculoskeletal:       Cervical back: She exhibits decreased range of motion, tenderness and spasm.  Normal grips bilaterally Normal UE reflexes  Neurological: She is alert and oriented to person, place, and time.  Skin: Skin is warm and dry. No rash noted.  Psychiatric: She has a normal mood and affect. Her behavior is normal. Thought content normal.  BP (!) 142/68   Pulse 66   Ht 5' 1.5" (1.562 m)   Wt 120 lb (54.4 kg)   SpO2 98%   BMI 22.31 kg/m   Assessment and Plan: 1. Neck muscle spasm Continue Ice or Heat Tylenol three times a day Add Baclofen   No orders of the defined types were placed in this encounter.   Partially dictated using Editor, commissioning. Any errors are unintentional.  [name], MD [facility]  02/09/2018

## 2018-02-09 NOTE — Patient Instructions (Signed)
Tylenol 2 tablets three times a day for neck pain  Take Baclofen at night - but you can it also twice during the day (along with tylenol)  Heat or Ice - at least 20 minutes three times a day

## 2018-02-22 DIAGNOSIS — Z5181 Encounter for therapeutic drug level monitoring: Secondary | ICD-10-CM | POA: Diagnosis not present

## 2018-02-22 DIAGNOSIS — Z79899 Other long term (current) drug therapy: Secondary | ICD-10-CM | POA: Diagnosis not present

## 2018-02-22 DIAGNOSIS — I10 Essential (primary) hypertension: Secondary | ICD-10-CM | POA: Diagnosis not present

## 2018-02-23 ENCOUNTER — Other Ambulatory Visit: Payer: Self-pay | Admitting: Internal Medicine

## 2018-02-28 DIAGNOSIS — Z794 Long term (current) use of insulin: Secondary | ICD-10-CM | POA: Diagnosis not present

## 2018-02-28 DIAGNOSIS — I10 Essential (primary) hypertension: Secondary | ICD-10-CM | POA: Diagnosis not present

## 2018-02-28 DIAGNOSIS — E1142 Type 2 diabetes mellitus with diabetic polyneuropathy: Secondary | ICD-10-CM | POA: Diagnosis not present

## 2018-02-28 DIAGNOSIS — E785 Hyperlipidemia, unspecified: Secondary | ICD-10-CM | POA: Diagnosis not present

## 2018-02-28 DIAGNOSIS — E1169 Type 2 diabetes mellitus with other specified complication: Secondary | ICD-10-CM | POA: Diagnosis not present

## 2018-02-28 DIAGNOSIS — E1159 Type 2 diabetes mellitus with other circulatory complications: Secondary | ICD-10-CM | POA: Diagnosis not present

## 2018-03-07 ENCOUNTER — Ambulatory Visit (INDEPENDENT_AMBULATORY_CARE_PROVIDER_SITE_OTHER): Payer: Medicare Other | Admitting: Internal Medicine

## 2018-03-07 ENCOUNTER — Encounter: Payer: Self-pay | Admitting: Internal Medicine

## 2018-03-07 VITALS — BP 160/41 | HR 62 | Resp 16 | Ht 61.25 in | Wt 120.3 lb

## 2018-03-07 DIAGNOSIS — I6523 Occlusion and stenosis of bilateral carotid arteries: Secondary | ICD-10-CM

## 2018-03-07 DIAGNOSIS — T466X5A Adverse effect of antihyperlipidemic and antiarteriosclerotic drugs, initial encounter: Secondary | ICD-10-CM

## 2018-03-07 DIAGNOSIS — M532X8 Spinal instabilities, sacral and sacrococcygeal region: Secondary | ICD-10-CM

## 2018-03-07 DIAGNOSIS — M5136 Other intervertebral disc degeneration, lumbar region: Secondary | ICD-10-CM | POA: Diagnosis not present

## 2018-03-07 DIAGNOSIS — M791 Myalgia, unspecified site: Secondary | ICD-10-CM | POA: Diagnosis not present

## 2018-03-07 LAB — POCT URINALYSIS DIPSTICK
BILIRUBIN UA: NEGATIVE
Blood, UA: NEGATIVE
GLUCOSE UA: NEGATIVE
Ketones, UA: NEGATIVE
Nitrite, UA: NEGATIVE
PROTEIN UA: 30
Spec Grav, UA: 1.02 (ref 1.010–1.025)
Urobilinogen, UA: 0.2 E.U./dL
pH, UA: 6.5 (ref 5.0–8.0)

## 2018-03-07 MED ORDER — PREDNISONE 10 MG PO TABS: ORAL_TABLET | ORAL | 0 refills | Status: DC

## 2018-03-07 NOTE — Progress Notes (Signed)
Date:  03/07/2018   Name:  Jill Hudson   DOB:  Feb 25, 1935   MRN:  [id number]   Chief Complaint: Abdominal Pain (3 days of right side pain that has worsened over last 24 hours. ); Back Pain (lower back pain on R side extending across abdomen lower quad area. Did fall 3 weeks ago and went o [place] as she hit head and neck when she fell out of bed. She did land on R side but states the side pain is new but had Mid back pain after fall. ); and Hypertension (BP was 183/58 this morning and she had slurred speech at that time but feels she may be taking husbands meds instead of her own. )  Back Pain  This is a new problem. The current episode started in the past 7 days. The problem is unchanged. The pain is present in the sacro-iliac. The quality of the pain is described as aching and cramping. Radiates to: to the anterior pelvis. The pain is moderate. The symptoms are aggravated by bending and twisting. Pertinent negatives include no abdominal pain, chest pain, dysuria, fever, numbness, paresthesias or pelvic pain. She has tried analgesics for the symptoms. The treatment provided mild relief.     Review of Systems  Constitutional: Negative for chills, fatigue and fever.  Respiratory: Negative for chest tightness and shortness of breath.   Cardiovascular: Negative for chest pain and palpitations.  Gastrointestinal: Negative for abdominal pain, constipation and diarrhea.  Genitourinary: Negative for dysuria and pelvic pain.  Musculoskeletal: Positive for arthralgias and back pain.  Neurological: Negative for numbness and paresthesias.    Patient Active Problem List   Diagnosis Date Noted  . Leg pain 07/03/2017  . Carpal tunnel syndrome on both sides 05/05/2017  . Myalgia due to HMG CoA reductase inhibitor 05/05/2017  . CVA (cerebral vascular accident) ([place]) 02/15/2017  . Degenerative disc disease, lumbar 12/21/2016  . Atherosclerosis of native arteries of extremity with intermittent  claudication ([place]) 12/20/2016  . Bilateral carotid artery stenosis 12/20/2016  . Hip bursitis 05/18/2016  . Elevated TSH 01/18/2016  . Chronic renal insufficiency, stage III (moderate) ([place]) 01/16/2016  . Senile ecchymosis 01/16/2016  . Uncontrolled type 2 diabetes mellitus with diabetic polyneuropathy, with long-term current use of insulin ([place]) 09/16/2015  . GERD (gastroesophageal reflux disease) 07/24/2015  . PAD (peripheral artery disease) ([place]) 05/17/2015  . Neoplasm of uncertain behavior of skin 05/17/2015  . Retinopathy, diabetic, proliferative ([place]) 04/11/2015  . Mixed hyperlipidemia 04/11/2015  . Essential (primary) hypertension 04/11/2015  . Generalized OA 04/11/2015  . Arteriosclerosis of coronary artery 05/29/2013  . Hypertensive heart disease without CHF 05/29/2013    Prior to Admission medications   Medication Sig Start Date End Date Taking? Authorizing Provider  acetaminophen (TYLENOL) 650 MG CR tablet Take 1,300 mg by mouth every 8 (eight) hours as needed for pain.   Yes [provider]  aspirin 81 MG tablet Take 81 mg by mouth daily.    Yes [provider]  B Complex Vitamins (VITAMIN-B COMPLEX) TABS Take 1 tablet by mouth daily.    Yes [provider]  Biotin 10 MG TABS Take 10 mg by mouth daily.    Yes [provider]  chlorthalidone (HYGROTON) 25 MG tablet Take 25 mg by mouth daily.   Yes [provider]  Cholecalciferol (VITAMIN D3 PO) Take 1 capsule by mouth 2 (two) times daily.   Yes [provider]  clopidogrel (PLAVIX) 75 MG tablet TAKE ONE  TABLET BY MOUTH EVERY DAY 01/13/18  Yes Schnier, Dolores Lory, MD  Cyanocobalamin (RA VITAMIN B-12 TR) 1000 MCG TBCR Take 1,000 mcg by mouth daily.    Yes [provider]  diltiazem (CARTIA XT) 180 MG 24 hr capsule Take 180 mg by mouth 2 (two) times daily.    Yes [provider]  insulin aspart (NOVOLOG FLEXPEN) 100 UNIT/ML FlexPen Inject 6 Units into the skin  3 (three) times daily before meals.  03/26/17  Yes [provider]  insulin glargine (LANTUS) 100 unit/mL SOPN Inject 12 Units into the skin daily.   Yes [provider]  Insulin Pen Needle (B-D ULTRAFINE III SHORT PEN) 31G X 8 MM MISC USE AS DIRECTED( APPLY BY DOSE NOT DAILY) 12/09/17  Yes [name], MD  loratadine (CLARITIN) 10 MG tablet Take 10 mg by mouth daily as needed for allergies.    Yes [provider]  losartan (COZAAR) 50 MG tablet Take 50 mg by mouth 2 (two) times daily. Take 50 mg by mouth in the morning and take 25 mg by mouth at bedtime 04/06/17 04/06/18 Yes [provider]  metFORMIN (GLUCOPHAGE-XR) 500 MG 24 hr tablet TAKE ONE TABLET BY MOUTH TWICE DAILY 01/26/18  Yes [name], MD  Multiple Vitamins-Minerals (PRESERVISION AREDS PO) Take 1 capsule by mouth 2 (two) times daily.    Yes [provider]  Omega 3 1000 MG CAPS Take 1,000 mg by mouth daily.  12/12/09  Yes [provider]  baclofen (LIORESAL) 10 MG tablet TAKE 1 TABLET(10 MG) BY MOUTH THREE TIMES DAILY Patient not taking: Reported on 03/07/2018 02/09/18   [name], MD    Allergies  Allergen Reactions  . Saxagliptin Diarrhea  . Atorvastatin Other (See Comments)    Muscle aches  . Codeine Other (See Comments)    Upset stomach  . Epinephrine Other (See Comments)    Unknown  . Ezetimibe Other (See Comments)    myalgias  . Nitrofurantoin Other (See Comments)    Pruitus, rash  . Limonene Rash  . Sulfa Antibiotics Rash and Other (See Comments)    Sore mouth     Past Surgical History:  Procedure Laterality Date  . CARDIAC CATHETERIZATION  1998   40% LM, 95% Ramus interm  . CATARACT EXTRACTION, BILATERAL    . EYE SURGERY    . LOWER EXTREMITY ANGIOGRAPHY Left 08/23/2017   Procedure: Lower Extremity Angiography;  Surgeon: Algernon Huxley, MD;  Location: [facility];  Service: Cardiovascular;  Laterality: Left;  . PTCA  08/2013   Left  common iliac  . PTCA  12/2012   left ext iliac  . TUBAL LIGATION      Social History   Tobacco Use  . Smoking status: Former Research scientist (life sciences)  . Smokeless tobacco: Never Used  Substance Use Topics  . Alcohol use: No    Alcohol/week: 0.0 oz  . Drug use: No     Medication list has been reviewed and updated.  PHQ 2/9 Scores 03/07/2018 06/02/2017 06/26/2016 05/17/2015  PHQ - 2 Score 0 1 0 0    Physical Exam  Constitutional: She is oriented to person, place, and time. She appears well-developed. No distress.  HENT:  Head: Normocephalic and atraumatic.  Cardiovascular: Normal rate and regular rhythm.  Pulmonary/Chest: Effort normal. No respiratory distress.  Abdominal: Soft. Normal appearance and bowel sounds are normal. There is no tenderness. There is no rigidity and no guarding.  Musculoskeletal:  Right hip: She exhibits decreased range of motion and tenderness.       Lumbar back: She exhibits tenderness. She exhibits no spasm.  Neurological: She is alert and oriented to person, place, and time.  Skin: Skin is warm and dry. No rash noted.  Psychiatric: She has a normal mood and affect. Her behavior is normal. Thought content normal.  Nursing note and vitals reviewed.   BP (!) 160/41   Pulse 62   Resp 16   Ht 5' 1.25" (1.556 m)   Wt 120 lb 4.8 oz (54.6 kg)   SpO2 98%   BMI 22.55 kg/m   Assessment and Plan: 1. Degenerative disc disease, lumbar With inflammation - may need to see Ortho - predniSONE (DELTASONE) 10 MG tablet; Take 6 on day 1, 5 on day 2, 4 on day 3, 3 on day 4, 2 on day 5 and 1 on day 1 then stop.  Dispense: 21 tablet; Refill: 0  2. Myalgia due to HMG CoA reductase inhibitor Stop Crestor - POCT urinalysis dipstick  3. Sacroiliac instability - predniSONE (DELTASONE) 10 MG tablet; Take 6 on day 1, 5 on day 2, 4 on day 3, 3 on day 4, 2 on day 5 and 1 on day 1 then stop.  Dispense: 21 tablet; Refill: 0   Meds ordered this encounter  Medications  . predniSONE  (DELTASONE) 10 MG tablet    Sig: Take 6 on day 1, 5 on day 2, 4 on day 3, 3 on day 4, 2 on day 5 and 1 on day 1 then stop.    Dispense:  21 tablet    Refill:  0    Partially dictated using Editor, commissioning. Any errors are unintentional.  [name], MD [facility]  03/07/2018

## 2018-03-07 NOTE — Patient Instructions (Signed)
Stop Crestor.

## 2018-03-09 ENCOUNTER — Ambulatory Visit: Payer: Medicare Other | Admitting: Internal Medicine

## 2018-03-12 ENCOUNTER — Encounter: Payer: Self-pay | Admitting: Gynecology

## 2018-03-12 ENCOUNTER — Ambulatory Visit
Admission: EM | Admit: 2018-03-12 | Discharge: 2018-03-12 | Disposition: A | Discharge status: Home / Self Care | Payer: Medicare Other | Attending: Family Medicine | Admitting: Family Medicine

## 2018-03-12 DIAGNOSIS — I10 Essential (primary) hypertension: Secondary | ICD-10-CM

## 2018-03-12 DIAGNOSIS — E1165 Type 2 diabetes mellitus with hyperglycemia: Secondary | ICD-10-CM

## 2018-03-12 DIAGNOSIS — S00261A Insect bite (nonvenomous) of right eyelid and periocular area, initial encounter: Secondary | ICD-10-CM

## 2018-03-12 DIAGNOSIS — W57XXXA Bitten or stung by nonvenomous insect and other nonvenomous arthropods, initial encounter: Secondary | ICD-10-CM

## 2018-03-12 NOTE — ED Provider Notes (Signed)
[facility]    CSN: [id number] Arrival date & time: 03/12/18  1000  History   Chief Complaint Chief Complaint  Patient presents with  . Insect Bite  . Blood Pressure Check  . Sugar test   HPI  83 year old female with an extensive past medical history presents with the above complaints.  Patient states that her blood pressure has recently been elevated.  She has a new medication waiting for her at the pharmacy that was prescribed by her primary.  She has yet to pick it up.  Additionally, patient states that her sugars have been elevated.  This is due to the current prednisone burst that she is on.  Lastly, patient states that she was bitten by a tick a few days ago.  She states that it was on her right upper eyelid.  She states that the tick was removed without difficulty.  Patient states that she is generally not feeling well but has had no rash or other signs/symptoms of tickborne illness.  No fever.  No other associated symptoms.  No other complaints.  Past Medical History:  Diagnosis Date  . Anxiety   . Arthritis   . Cancer ([place])    skin  . Diabetes mellitus without complication ([place])   . Hyperlipidemia   . Hypertension   . Mitral and aortic valve disease   . Occasional tremors   . Shingles   . TIA (transient ischemic attack) 01/2017  . UTI (urinary tract infection)   . Vascular disease, peripheral [facility])    Patient Active Problem List   Diagnosis Date Noted  . Leg pain 07/03/2017  . Carpal tunnel syndrome on both sides 05/05/2017  . Myalgia due to HMG CoA reductase inhibitor 05/05/2017  . CVA (cerebral vascular accident) ([place]) 02/15/2017  . Degenerative disc disease, lumbar 12/21/2016  . Atherosclerosis of native arteries of extremity with intermittent claudication ([place]) 12/20/2016  . Bilateral carotid artery stenosis 12/20/2016  . Hip bursitis 05/18/2016  . Elevated TSH 01/18/2016  . Chronic renal insufficiency, stage III (moderate) ([place]) 01/16/2016  .  Senile ecchymosis 01/16/2016  . Uncontrolled type 2 diabetes mellitus with diabetic polyneuropathy, with long-term current use of insulin ([place]) 09/16/2015  . GERD (gastroesophageal reflux disease) 07/24/2015  . PAD (peripheral artery disease) ([place]) 05/17/2015  . Neoplasm of uncertain behavior of skin 05/17/2015  . Retinopathy, diabetic, proliferative ([place]) 04/11/2015  . Mixed hyperlipidemia 04/11/2015  . Essential (primary) hypertension 04/11/2015  . Generalized OA 04/11/2015  . Arteriosclerosis of coronary artery 05/29/2013  . Hypertensive heart disease without CHF 05/29/2013    Past Surgical History:  Procedure Laterality Date  . CARDIAC CATHETERIZATION  1998   40% LM, 95% Ramus interm  . CATARACT EXTRACTION, BILATERAL    . EYE SURGERY    . LOWER EXTREMITY ANGIOGRAPHY Left 08/23/2017   Procedure: Lower Extremity Angiography;  Surgeon: Algernon Huxley, MD;  Location: [facility];  Service: Cardiovascular;  Laterality: Left;  . PTCA  08/2013   Left common iliac  . PTCA  12/2012   left ext iliac  . TUBAL LIGATION      OB History   None      Home Medications    Prior to Admission medications   Medication Sig Start Date End Date Taking? Authorizing Provider  acetaminophen (TYLENOL) 650 MG CR tablet Take 1,300 mg by mouth every 8 (eight) hours as needed for pain.   Yes [provider]  aspirin 81 MG tablet Take 81 mg by  mouth daily.    Yes [provider]  B Complex Vitamins (VITAMIN-B COMPLEX) TABS Take 1 tablet by mouth daily.    Yes [provider]  baclofen (LIORESAL) 10 MG tablet TAKE 1 TABLET(10 MG) BY MOUTH THREE TIMES DAILY 02/09/18  Yes [name], MD  Biotin 10 MG TABS Take 10 mg by mouth daily.    Yes [provider]  chlorthalidone (HYGROTON) 25 MG tablet Take 25 mg by mouth daily.   Yes [provider]  Cholecalciferol (VITAMIN D3 PO) Take 1 capsule by mouth 2 (two) times daily.   Yes [provider]  clopidogrel (PLAVIX) 75 MG tablet TAKE ONE TABLET BY MOUTH EVERY DAY 01/13/18  Yes Schnier, Dolores Lory, MD  Cyanocobalamin (RA VITAMIN B-12 TR) 1000 MCG TBCR Take 1,000 mcg by mouth daily.    Yes [provider]  diltiazem (CARTIA XT) 180 MG 24 hr capsule Take 180 mg by mouth 2 (two) times daily.    Yes [provider]  insulin aspart (NOVOLOG FLEXPEN) 100 UNIT/ML FlexPen Inject 6 Units into the skin 3 (three) times daily before meals.  03/26/17  Yes [provider]  insulin glargine (LANTUS) 100 unit/mL SOPN Inject 12 Units into the skin daily.   Yes [provider]  Insulin Pen Needle (B-D ULTRAFINE III SHORT PEN) 31G X 8 MM MISC USE AS DIRECTED( APPLY BY DOSE NOT DAILY) 12/09/17  Yes [name], MD  loratadine (CLARITIN) 10 MG tablet Take 10 mg by mouth daily as needed for allergies.    Yes [provider]  losartan (COZAAR) 50 MG tablet Take 50 mg by mouth 2 (two) times daily. Take 50 mg by mouth in the morning and take 25 mg by mouth at bedtime 04/06/17 04/06/18 Yes [provider]  metFORMIN (GLUCOPHAGE-XR) 500 MG 24 hr tablet TAKE ONE TABLET BY MOUTH TWICE DAILY 01/26/18  Yes [name], MD  Multiple Vitamins-Minerals (PRESERVISION AREDS PO) Take 1 capsule by mouth 2 (two) times daily.    Yes [provider]  Omega 3 1000 MG CAPS Take 1,000 mg by mouth daily.  12/12/09  Yes [provider]  predniSONE (DELTASONE) 10 MG tablet Take 6 on day 1, 5 on day 2, 4 on day 3, 3 on day 4, 2 on day 5 and 1 on day 1 then stop. 03/07/18  Yes [name], MD    Family History Family History  Problem Relation Age of Onset  . Dementia Mother   . Diabetes Father     Social History Social History   Tobacco Use  . Smoking status: Former Research scientist (life sciences)  . Smokeless tobacco: Never Used  Substance Use Topics  . Alcohol use: No    Alcohol/week: 0.0 oz  . Drug use: No     Allergies   Saxagliptin; Atorvastatin; Codeine;  Epinephrine; Ezetimibe; Nitrofurantoin; Limonene; and Sulfa antibiotics   Review of Systems Review of Systems  Constitutional: Negative for fever.  Skin:       Tick bite.   Physical Exam Triage Vital Signs ED Triage Vitals  Enc Vitals Group     BP 03/12/18 1023 (!) 165/50     Pulse Rate 03/12/18 1023 60     Resp 03/12/18 1023 16     Temp 03/12/18 1023 98.5 F (36.9 C)     Temp Source 03/12/18 1023 Oral     SpO2 03/12/18 1023 99 %     Weight 03/12/18 1025 120 lb (  54.4 kg)     Height --      Head Circumference --      Peak Flow --      Pain Score 03/12/18 1022 2     Pain Loc --      Pain Edu? --      Excl. in [place]? --    Updated Vital Signs BP (!) 165/50 (BP Location: Left Arm)   Pulse 60   Temp 98.5 F (36.9 C) (Oral)   Resp 16   Wt 120 lb (54.4 kg)   SpO2 99%   BMI 22.49 kg/m   Physical Exam  Constitutional: She is oriented to person, place, and time. She appears well-developed. No distress.  Cardiovascular: Normal rate and regular rhythm.  Pulmonary/Chest: Effort normal and breath sounds normal. She has no wheezes. She has no rales.  Neurological: She is alert and oriented to person, place, and time.  Skin:  Patient is a small red area on the right upper eyelid.  No rash.  Psychiatric: She has a normal mood and affect. Her behavior is normal.  Nursing note and vitals reviewed.  UC Treatments / Results  Labs (all labs ordered are listed, but only abnormal results are displayed) Labs Reviewed - No data to display  EKG None  Radiology No results found.  Procedures Procedures (including critical care time)  Medications Ordered in UC Medications - No data to display  Initial Impression / Assessment and Plan / UC Course  I have reviewed the triage vital signs and the nursing notes.  Pertinent labs & imaging results that were available during my care of the patient were reviewed by me and considered in my medical decision making (see chart for  details).    83 year old female presents with primary concern about a tick bite.  She is well-appearing.  No indication for further evaluation or treatment.  Supportive care.  I advised her to get her new blood pressure medication at the pharmacy.  Follow-up with her primary care physician.  Final Clinical Impressions(s) / UC Diagnoses   Final diagnoses:  Tick bite, initial encounter     Discharge Instructions     Go get your new medication.  No need to worry about the tick bite at this time.  Take care  Dr. Lacinda Axon    ED Prescriptions    None     Controlled Substance Prescriptions [place] Controlled Substance Registry consulted? Not Applicable   Coral Spikes, DO 03/12/18 [id number]

## 2018-03-12 NOTE — Discharge Instructions (Signed)
Go get your new medication.  No need to worry about the tick bite at this time.  Take care  Dr. Lacinda Axon

## 2018-03-12 NOTE — ED Triage Notes (Signed)
Patient stated blood pressure reading this am was 175/58 and  Fasting sugar test 261. Pt. Stated was told that her prednisone that she is taking will make her sugar goes up. Per patient her doctor call in 2 new medication for her blood pressure x yesterday at her pharmacy which she will pick up today.Per patient also had a tick bite on right eyelid x 3 days ago.

## 2018-03-15 DIAGNOSIS — E785 Hyperlipidemia, unspecified: Secondary | ICD-10-CM | POA: Diagnosis not present

## 2018-03-15 DIAGNOSIS — I2510 Atherosclerotic heart disease of native coronary artery without angina pectoris: Secondary | ICD-10-CM | POA: Diagnosis not present

## 2018-03-15 DIAGNOSIS — I10 Essential (primary) hypertension: Secondary | ICD-10-CM | POA: Diagnosis not present

## 2018-03-23 DIAGNOSIS — I119 Hypertensive heart disease without heart failure: Secondary | ICD-10-CM | POA: Diagnosis not present

## 2018-03-23 DIAGNOSIS — I10 Essential (primary) hypertension: Secondary | ICD-10-CM | POA: Diagnosis not present

## 2018-04-20 DIAGNOSIS — E785 Hyperlipidemia, unspecified: Secondary | ICD-10-CM | POA: Diagnosis not present

## 2018-04-20 DIAGNOSIS — I2510 Atherosclerotic heart disease of native coronary artery without angina pectoris: Secondary | ICD-10-CM | POA: Diagnosis not present

## 2018-04-20 DIAGNOSIS — E875 Hyperkalemia: Secondary | ICD-10-CM | POA: Diagnosis not present

## 2018-04-20 DIAGNOSIS — Z5181 Encounter for therapeutic drug level monitoring: Secondary | ICD-10-CM | POA: Diagnosis not present

## 2018-04-20 DIAGNOSIS — I10 Essential (primary) hypertension: Secondary | ICD-10-CM | POA: Diagnosis not present

## 2018-04-20 DIAGNOSIS — Z79899 Other long term (current) drug therapy: Secondary | ICD-10-CM | POA: Diagnosis not present

## 2018-05-27 DIAGNOSIS — Z888 Allergy status to other drugs, medicaments and biological substances status: Secondary | ICD-10-CM | POA: Diagnosis not present

## 2018-05-27 DIAGNOSIS — Z87891 Personal history of nicotine dependence: Secondary | ICD-10-CM | POA: Diagnosis not present

## 2018-05-27 DIAGNOSIS — Z95820 Peripheral vascular angioplasty status with implants and grafts: Secondary | ICD-10-CM | POA: Diagnosis not present

## 2018-05-27 DIAGNOSIS — E1151 Type 2 diabetes mellitus with diabetic peripheral angiopathy without gangrene: Secondary | ICD-10-CM | POA: Diagnosis not present

## 2018-05-27 DIAGNOSIS — Z7982 Long term (current) use of aspirin: Secondary | ICD-10-CM | POA: Diagnosis not present

## 2018-05-27 DIAGNOSIS — Z885 Allergy status to narcotic agent status: Secondary | ICD-10-CM | POA: Diagnosis not present

## 2018-05-27 DIAGNOSIS — E785 Hyperlipidemia, unspecified: Secondary | ICD-10-CM | POA: Diagnosis not present

## 2018-05-27 DIAGNOSIS — R7309 Other abnormal glucose: Secondary | ICD-10-CM | POA: Diagnosis not present

## 2018-05-27 DIAGNOSIS — Z882 Allergy status to sulfonamides status: Secondary | ICD-10-CM | POA: Diagnosis not present

## 2018-05-27 DIAGNOSIS — E7800 Pure hypercholesterolemia, unspecified: Secondary | ICD-10-CM | POA: Diagnosis not present

## 2018-05-27 DIAGNOSIS — E875 Hyperkalemia: Secondary | ICD-10-CM | POA: Diagnosis not present

## 2018-05-27 DIAGNOSIS — I2510 Atherosclerotic heart disease of native coronary artery without angina pectoris: Secondary | ICD-10-CM | POA: Diagnosis not present

## 2018-05-27 DIAGNOSIS — I739 Peripheral vascular disease, unspecified: Secondary | ICD-10-CM | POA: Diagnosis not present

## 2018-05-27 DIAGNOSIS — I10 Essential (primary) hypertension: Secondary | ICD-10-CM | POA: Diagnosis not present

## 2018-05-27 DIAGNOSIS — Z9861 Coronary angioplasty status: Secondary | ICD-10-CM | POA: Diagnosis not present

## 2018-05-27 DIAGNOSIS — Z79899 Other long term (current) drug therapy: Secondary | ICD-10-CM | POA: Diagnosis not present

## 2018-05-27 DIAGNOSIS — Z789 Other specified health status: Secondary | ICD-10-CM | POA: Diagnosis not present

## 2018-06-09 ENCOUNTER — Encounter (INDEPENDENT_AMBULATORY_CARE_PROVIDER_SITE_OTHER): Payer: Self-pay | Admitting: Vascular Surgery

## 2018-06-09 ENCOUNTER — Ambulatory Visit (INDEPENDENT_AMBULATORY_CARE_PROVIDER_SITE_OTHER): Payer: Medicare Other | Admitting: Vascular Surgery

## 2018-06-09 VITALS — BP 153/56 | HR 62 | Resp 16 | Ht 61.25 in | Wt 123.0 lb

## 2018-06-09 DIAGNOSIS — E1165 Type 2 diabetes mellitus with hyperglycemia: Secondary | ICD-10-CM

## 2018-06-09 DIAGNOSIS — E1142 Type 2 diabetes mellitus with diabetic polyneuropathy: Secondary | ICD-10-CM

## 2018-06-09 DIAGNOSIS — IMO0002 Reserved for concepts with insufficient information to code with codable children: Secondary | ICD-10-CM

## 2018-06-09 DIAGNOSIS — M5136 Other intervertebral disc degeneration, lumbar region: Secondary | ICD-10-CM | POA: Diagnosis not present

## 2018-06-09 DIAGNOSIS — I6523 Occlusion and stenosis of bilateral carotid arteries: Secondary | ICD-10-CM

## 2018-06-09 DIAGNOSIS — M51369 Other intervertebral disc degeneration, lumbar region without mention of lumbar back pain or lower extremity pain: Secondary | ICD-10-CM

## 2018-06-09 DIAGNOSIS — E782 Mixed hyperlipidemia: Secondary | ICD-10-CM | POA: Diagnosis not present

## 2018-06-09 DIAGNOSIS — Z794 Long term (current) use of insulin: Secondary | ICD-10-CM

## 2018-06-09 DIAGNOSIS — I739 Peripheral vascular disease, unspecified: Secondary | ICD-10-CM | POA: Diagnosis not present

## 2018-06-09 NOTE — Progress Notes (Signed)
Subjective:    Patient ID: Jill Hudson, female    DOB: [birth year], 83 y.o.   MRN: [id number] Chief Complaint  Patient presents with  . Follow-up   Patient last seen on December 20, 2017 for evaluation of carotid artery stenosis.  Patient presents today as per her request and on the advice of her cardiologist for bilateral lower extremity weakness.  The patient notes that she fell out of bed in February 2019 and since that accident has experienced progressively worsening weakening in her bilateral lower extremity.  The patient denies any claudication-like symptoms, rest pain or ulcer formation to the bilateral lower extremity.  The patient does note that her weakness has progressed to the point that she is unable to function down the patient and her symptoms have become lifestyle limiting.  The patient does have a past medical history of lumbar degenerative joint disease and osteoarthritis.  The patient notes that her weakness improves in the evening and overnight.  The patient denies any edema to the bilateral lower extremity.  Patient denies any fever, nausea or vomiting.   Review of Systems  Constitutional: Negative.   HENT: Negative.   Eyes: Negative.   Respiratory: Negative.   Cardiovascular: Negative.   Gastrointestinal: Negative.   Endocrine: Negative.   Genitourinary: Negative.   Musculoskeletal: Negative.   Skin: Negative.   Allergic/Immunologic: Negative.   Neurological: Positive for weakness.  Hematological: Negative.   Psychiatric/Behavioral: Negative.       Objective:   Physical Exam  Constitutional: She is oriented to person, place, and time. She appears well-developed and well-nourished. No distress.  HENT:  Head: Normocephalic and atraumatic.  Right Ear: External ear normal.  Left Ear: External ear normal.  Eyes: Pupils are equal, round, and reactive to light. Conjunctivae and EOM are normal.  Neck: Normal range of motion.  Cardiovascular: Normal rate, regular  rhythm, normal heart sounds and intact distal pulses.  Pulses:      Radial pulses are 2+ on the right side, and 2+ on the left side.  Hard to palpate pedal pulses however the bilateral legs and feet are warm.  Pulmonary/Chest: Effort normal and breath sounds normal.  Musculoskeletal: Normal range of motion. She exhibits no edema.  Neurological: She is alert and oriented to person, place, and time.  Skin: Skin is warm and dry. She is not diaphoretic.  Psychiatric: She has a normal mood and affect. Her behavior is normal. Judgment and thought content normal.  Vitals reviewed.  BP (!) 153/56 (BP Location: Right Arm)   Pulse 62   Resp 16   Ht 5' 1.25" (1.556 m)   Wt 123 lb (55.8 kg)   BMI 23.05 kg/m   Past Medical History:  Diagnosis Date  . Anxiety   . Arthritis   . Cancer ([place])    skin  . Diabetes mellitus without complication ([place])   . Hyperlipidemia   . Hypertension   . Mitral and aortic valve disease   . Occasional tremors   . Shingles   . TIA (transient ischemic attack) 01/2017  . UTI (urinary tract infection)   . Vascular disease, peripheral ([place])    Social History   Socioeconomic History  . Marital status: Married    Spouse name: Not on file  . Number of children: Not on file  . Years of education: Not on file  . Highest education level: Not on file  Occupational History  . Not on file  Social Needs  . Emergency planning/management officer  strain: Not on file  . Food insecurity:    Worry: Not on file    Inability: Not on file  . Transportation needs:    Medical: Not on file    Non-medical: Not on file  Tobacco Use  . Smoking status: Former Research scientist (life sciences)  . Smokeless tobacco: Never Used  Substance and Sexual Activity  . Alcohol use: No    Alcohol/week: 0.0 standard drinks  . Drug use: No  . Sexual activity: Not on file  Lifestyle  . Physical activity:    Days per week: Not on file    Minutes per session: Not on file  . Stress: Not on file  Relationships  . Social  connections:    Talks on phone: Not on file    Gets together: Not on file    Attends religious service: Not on file    Active member of club or organization: Not on file    Attends meetings of clubs or organizations: Not on file    Relationship status: Not on file  . Intimate partner violence:    Fear of current or ex partner: Not on file    Emotionally abused: Not on file    Physically abused: Not on file    Forced sexual activity: Not on file  Other Topics Concern  . Not on file  Social History Narrative  . Not on file   Past Surgical History:  Procedure Laterality Date  . CARDIAC CATHETERIZATION  1998   40% LM, 95% Ramus interm  . CATARACT EXTRACTION, BILATERAL    . EYE SURGERY    . LOWER EXTREMITY ANGIOGRAPHY Left 08/23/2017   Procedure: Lower Extremity Angiography;  Surgeon: Algernon Huxley, MD;  Location: [facility] [place] [facility];  Service: Cardiovascular;  Laterality: Left;  . PTCA  08/2013   Left common iliac  . PTCA  12/2012   left ext iliac  . TUBAL LIGATION     Family History  Problem Relation Age of Onset  . Dementia Mother   . Diabetes Father    Allergies  Allergen Reactions  . Saxagliptin Diarrhea  . Atorvastatin Other (See Comments)    Muscle aches  . Codeine Other (See Comments)    Upset stomach  . Epinephrine Other (See Comments)    Unknown  . Ezetimibe Other (See Comments)    myalgias  . Nitrofurantoin Other (See Comments)    Pruitus, rash  . Limonene Rash  . Sulfa Antibiotics Rash and Other (See Comments)    Sore mouth       Assessment & Plan:  Patient last seen on December 20, 2017 for evaluation of carotid artery stenosis.  Patient presents today as per her request and on the advice of her cardiologist for bilateral lower extremity weakness.  The patient notes that she fell out of bed in February 2019 and since that accident has experienced progressively worsening weakening in her bilateral lower extremity.  The patient denies any claudication-like  symptoms, rest pain or ulcer formation to the bilateral lower extremity.  The patient does note that her weakness has progressed to the point that she is unable to function down the patient and her symptoms have become lifestyle limiting.  The patient does have a past medical history of lumbar degenerative joint disease and osteoarthritis.  The patient notes that her weakness improves in the evening and overnight.  The patient denies any edema to the bilateral lower extremity.  Patient denies any fever, nausea or vomiting.   1.  PAD (peripheral artery disease) ([place]) - Stable Patient does have past medical history of peripheral artery disease Patient presents today without claudication-like symptoms, rest pain or ulcer formation to the bilateral lower extremity however has been progressively worsening since February 2019 Hard to palpate pedal pulses on exam Patient with multiple risk factors for peripheral artery disease I have discussed with the patient at length the risk factors for and pathogenesis of atherosclerotic disease and encouraged a healthy diet, regular exercise regimen and blood pressure / glucose control.  The patient was encouraged to call the office in the interim if he experiences any claudication like symptoms, rest pain or ulcers to his feet / toes.  - VAS [place] ABI WITH/WO TBI; Future  2. Degenerative disc disease, lumbar - Stable May also be a contributing factor to the patient's bilateral lower extremity weakness especially since her weakness started to progressively worsen after trauma.  3. Uncontrolled type 2 diabetes mellitus with diabetic polyneuropathy, with long-term current use of insulin (HCC) - Stable Encouraged good control as its slows the progression of atherosclerotic disease  4. Mixed hyperlipidemia - Stable Encouraged good control as its slows the progression of atherosclerotic disease  Current Outpatient Medications on File Prior to Visit  Medication Sig  Dispense Refill  . acetaminophen (TYLENOL) 650 MG CR tablet Take 1,300 mg by mouth every 8 (eight) hours as needed for pain.    Marland Kitchen aspirin 81 MG tablet Take 81 mg by mouth daily.     . B Complex Vitamins (VITAMIN-B COMPLEX) TABS Take 1 tablet by mouth daily.     . baclofen (LIORESAL) 10 MG tablet TAKE 1 TABLET(10 MG) BY MOUTH THREE TIMES DAILY 270 tablet 0  . Biotin 10 MG TABS Take 10 mg by mouth daily.     . chlorthalidone (HYGROTON) 25 MG tablet Take 25 mg by mouth daily.    . Cholecalciferol (VITAMIN D3 PO) Take 1 capsule by mouth 2 (two) times daily.    . clopidogrel (PLAVIX) 75 MG tablet TAKE ONE TABLET BY MOUTH EVERY DAY 30 tablet 11  . Cyanocobalamin (RA VITAMIN B-12 TR) 1000 MCG TBCR Take 1,000 mcg by mouth daily.     Marland Kitchen diltiazem (CARTIA XT) 180 MG 24 hr capsule Take 180 mg by mouth 2 (two) times daily.     . insulin aspart (NOVOLOG FLEXPEN) 100 UNIT/ML FlexPen Inject 6 Units into the skin 3 (three) times daily before meals.     . insulin glargine (LANTUS) 100 unit/mL SOPN Inject 12 Units into the skin daily.    . Insulin Pen Needle (B-D ULTRAFINE III SHORT PEN) 31G X 8 MM MISC USE AS DIRECTED( APPLY BY DOSE NOT DAILY) 100 each 3  . loratadine (CLARITIN) 10 MG tablet Take 10 mg by mouth daily as needed for allergies.     . metFORMIN (GLUCOPHAGE-XR) 500 MG 24 hr tablet TAKE ONE TABLET BY MOUTH TWICE DAILY 60 tablet 11  . Multiple Vitamins-Minerals (PRESERVISION AREDS PO) Take 1 capsule by mouth 2 (two) times daily.     . Omega 3 1000 MG CAPS Take 1,000 mg by mouth daily.     . predniSONE (DELTASONE) 10 MG tablet Take 6 on day 1, 5 on day 2, 4 on day 3, 3 on day 4, 2 on day 5 and 1 on day 1 then stop. 21 tablet 0   No current facility-administered medications on file prior to visit.    There are no Patient Instructions on file for this visit. No  follow-ups on file.  Emony Dormer A Khala Tarte, PA-C

## 2018-06-15 ENCOUNTER — Ambulatory Visit (INDEPENDENT_AMBULATORY_CARE_PROVIDER_SITE_OTHER): Payer: Medicare Other

## 2018-06-15 VITALS — BP 134/50 | HR 76 | Temp 98.0°F | Resp 12 | Ht 61.0 in | Wt 124.0 lb

## 2018-06-15 DIAGNOSIS — Z0000 Encounter for general adult medical examination without abnormal findings: Secondary | ICD-10-CM

## 2018-06-15 DIAGNOSIS — E2839 Other primary ovarian failure: Secondary | ICD-10-CM | POA: Diagnosis not present

## 2018-06-15 NOTE — Progress Notes (Signed)
Subjective:   Jill Hudson is a 83 y.o. female who presents for Medicare Annual (Subsequent) preventive examination.  Review of Systems:  N/A Cardiac Risk Factors include: advanced age (>28men, >48 women);dyslipidemia;diabetes mellitus;hypertension;sedentary lifestyle     Objective:     Vitals: BP (!) 134/50 (BP Location: Right Arm, Patient Position: Sitting, Cuff Size: Normal)   Pulse 76   Temp 98 F (36.7 C) (Oral)   Resp 12   Ht 5\' 1"  (1.549 m)   Wt 124 lb (56.2 kg)   SpO2 96%   BMI 23.43 kg/m   Body mass index is 23.43 kg/m.  Advanced Directives 06/15/2018 08/23/2017 08/11/2017 07/01/2017 06/02/2017 02/15/2017 02/15/2017  Does Patient Have a Medical Advance Directive? Yes Yes Yes Yes Yes (No Data) Yes  Type of Advance Directive [place];Living will [place];Living will [place];Living will [place];Living will Living will - -  Does patient want to make changes to medical advance directive? - No - Patient declined - - - - -  Copy of [place] in Chart? No - copy requested No - copy requested - - - - -    Tobacco Social History   Tobacco Use  Smoking Status Former Smoker  . Packs/day: 2.00  . Years: 37.00  . Pack years: 74.00  . Types: Cigarettes  . Last attempt to quit: 1980  . Years since quitting: 39.6  Smokeless Tobacco Never Used  Tobacco Comment   smoking cessation materials not required     Counseling given: No Comment: smoking cessation materials not required  Clinical Intake:  Pre-visit preparation completed: Yes  Pain : No/denies pain   BMI - recorded: 23.43 Nutritional Status: BMI of 19-24  Normal Nutritional Risks: None  Nutrition Risk Assessment: Has the patient had any N/V/D within the last 2 months?  No Does the patient have any non-healing wounds?  No Has the patient had any unintentional weight loss or weight gain?  No  Is the patient  diabetic?  Yes If diabetic, was a CBG obtained today?  No Did the patient bring in their glucometer from home?  No Comments: Pt monitors CBG's 3-4x's daily. Denies any financial strains with the device or supplies.  Diabetic Exams: Diabetic Eye Exam: Completed 12/09/17.  Diabetic Foot Exam: Completed 06/02/17. Pt has been advised about the importance in completing this exam. Pt has requested to schedule her own appt with Dr. Vickki Muff.  How often do you need to have someone help you when you read instructions, pamphlets, or other written materials from your doctor or pharmacy?: 1 - Never  Interpreter Needed?: No  Information entered by :: Idell Pickles, LPN  Past Medical History:  Diagnosis Date  . Anxiety   . Arthritis   . Cancer ([place])    skin  . Diabetes mellitus without complication ([place])   . Hyperlipidemia   . Hypertension   . Mitral and aortic valve disease   . Occasional tremors   . Shingles   . TIA (transient ischemic attack) 01/2017  . UTI (urinary tract infection)   . Vascular disease, peripheral [facility])    Past Surgical History:  Procedure Laterality Date  . CARDIAC CATHETERIZATION  1998   40% LM, 95% Ramus interm  . CATARACT EXTRACTION, BILATERAL    . EYE SURGERY    . LOWER EXTREMITY ANGIOGRAPHY Left 08/23/2017   Procedure: Lower Extremity Angiography;  Surgeon: Algernon Huxley, MD;  Location: [facility] INVAS[facility]  [facility];  Service: Cardiovascular;  Laterality: Left;  . PTCA  08/2013   Left common iliac  . PTCA  12/2012   left ext iliac  . TUBAL LIGATION     Family History  Problem Relation Age of Onset  . Dementia Mother   . Diabetes Father    Social History   Socioeconomic History  . Marital status: Married    Spouse name: Not on file  . Number of children: 3  . Years of education: Not on file  . Highest education level: 12th grade  Occupational History  . Occupation: Retired  Scientific laboratory technician  . Financial resource strain: Not hard at all  . Food insecurity:    Worry:  Never true    Inability: Never true  . Transportation needs:    Medical: No    Non-medical: No  Tobacco Use  . Smoking status: Former Smoker    Packs/day: 2.00    Years: 37.00    Pack years: 74.00    Types: Cigarettes    Last attempt to quit: 1980    Years since quitting: 39.6  . Smokeless tobacco: Never Used  . Tobacco comment: smoking cessation materials not required  Substance and Sexual Activity  . Alcohol use: No    Alcohol/week: 0.0 standard drinks  . Drug use: No  . Sexual activity: Not Currently  Lifestyle  . Physical activity:    Days per week: 0 days    Minutes per session: 0 min  . Stress: Not at all  Relationships  . Social connections:    Talks on phone: Patient refused    Gets together: Patient refused    Attends religious service: Patient refused    Active member of club or organization: Patient refused    Attends meetings of clubs or organizations: Patient refused    Relationship status: Married  Other Topics Concern  . Not on file  Social History Narrative  . Not on file    Outpatient Encounter Medications as of 06/15/2018  Medication Sig  . acetaminophen (TYLENOL) 650 MG CR tablet Take 1,300 mg by mouth every 8 (eight) hours as needed for pain.  Marland Kitchen amLODipine (NORVASC) 2.5 MG tablet Take 2.5 mg by mouth daily.  Marland Kitchen aspirin 81 MG tablet Take 81 mg by mouth daily.   . B Complex Vitamins (VITAMIN-B COMPLEX) TABS Take 1 tablet by mouth daily.   . baclofen (LIORESAL) 10 MG tablet TAKE 1 TABLET(10 MG) BY MOUTH THREE TIMES DAILY  . Biotin 10 MG TABS Take 10 mg by mouth daily.   . chlorthalidone (HYGROTON) 25 MG tablet Take 25 mg by mouth daily.  . Cholecalciferol (VITAMIN D3 PO) Take 1 capsule by mouth 2 (two) times daily.  . clopidogrel (PLAVIX) 75 MG tablet TAKE ONE TABLET BY MOUTH EVERY DAY  . Cyanocobalamin (RA VITAMIN B-12 TR) 1000 MCG TBCR Take 1,000 mcg by mouth daily.   Marland Kitchen diltiazem (CARTIA XT) 180 MG 24 hr capsule Take 180 mg by mouth 2 (two) times  daily.   . insulin aspart (NOVOLOG FLEXPEN) 100 UNIT/ML FlexPen Inject 6 Units into the skin 3 (three) times daily before meals.   . insulin glargine (LANTUS) 100 unit/mL SOPN Inject 12 Units into the skin daily.  . Insulin Pen Needle (B-D ULTRAFINE III SHORT PEN) 31G X 8 MM MISC USE AS DIRECTED( APPLY BY DOSE NOT DAILY)  . loratadine (CLARITIN) 10 MG tablet Take 10 mg by mouth daily as needed for allergies.   . metFORMIN (  GLUCOPHAGE-XR) 500 MG 24 hr tablet TAKE ONE TABLET BY MOUTH TWICE DAILY  . Multiple Vitamins-Minerals (PRESERVISION AREDS PO) Take 1 capsule by mouth 2 (two) times daily.   . Omega 3 1000 MG CAPS Take 1,000 mg by mouth daily.   . rosuvastatin (CRESTOR) 5 MG tablet Take 10 mg by mouth 4 (four) times a week.  . predniSONE (DELTASONE) 10 MG tablet Take 6 on day 1, 5 on day 2, 4 on day 3, 3 on day 4, 2 on day 5 and 1 on day 1 then stop. (Patient not taking: Reported on 06/15/2018)   No facility-administered encounter medications on file as of 06/15/2018.     Activities of Daily Living In your present state of health, do you have any difficulty performing the following activities: 06/15/2018  Hearing? N  Comment denies hearing aids  Vision? N  Comment wears eyeglasses  Difficulty concentrating or making decisions? Y  Walking or climbing stairs? Y  Comment joint pain  Dressing or bathing? N  Doing errands, shopping? N  Preparing Food and eating ? N  Comment denies dentures  Using the Toilet? N  In the past six months, have you accidently leaked urine? N  Do you have problems with loss of bowel control? N  Managing your Medications? N  Managing your Finances? N  Housekeeping or managing your Housekeeping? N  Some recent data might be hidden    Patient Care Team: [name], MD as PCP - General (Family Medicine) Schnier, Dolores Lory, MD as Consulting Physician (Vascular Surgery) [name], MD as Consulting Physician (Internal Medicine) [name],  DPM as Consulting Physician (Podiatry) Corey Skains, MD as Consulting Physician (Cardiology) Jannet Mantis, MD as Consulting Physician (Dermatology)    Assessment:   This is a routine wellness examination for [place].  Exercise Activities and Dietary recommendations Current Exercise Habits: The patient does not participate in regular exercise at present, Exercise limited by: None identified  Goals    . DIET - INCREASE WATER INTAKE     Recommend to drink at least 6-8 8oz glasses of water per day.       Fall Risk Fall Risk  06/15/2018 03/07/2018 06/02/2017 12/09/2016 12/01/2016  Falls in the past year? No Yes No No -  Number falls in past yr: - 1 - - -  Injury with Fall? - Yes - - -  Comment - head/neck hurt  - - -  Risk Factor Category  - High Fall Risk - - -  Risk for fall due to : Impaired vision;History of fall(s);Other (Comment) - - - (No Data)  Risk for fall due to: Comment wears eyeglasses; rolled out of bed; joint pain - - - no falls since last visit   [place]: Is your home free of loose throw rugs in walkways, pet beds, electrical cords, etc? Yes Is there adequate lighting in your home to reduce risk of falls?  Yes Are there stairs in or around your home WITH handrails? Yes  ASSISTIVE DEVICES UTILIZED TO PREVENT FALLS: Use of a cane, walker or w/c? No Grab bars in the bathroom? Yes  Shower chair or a place to sit while bathing? Yes An elevated toilet seat or a handicapped toilet? Yes  Timed Get Up and Go Performed: Yes. Pt ambulated 10 feet within 16 sec. Gait slow, steady and without the use of an assistive device. No intervention required at this time. Fall risk prevention has been  discussed.  Community Resource Referral:  [place] Referral not required at this time.   Depression Screen PHQ 2/9 Scores 06/15/2018 03/07/2018 06/02/2017 06/26/2016  PHQ - 2 Score 0 0 1 0  PHQ- 9 Score 0 - - -     Cognitive Function       6CIT Screen 06/15/2018 06/02/2017  What Year? 0 points 0 points  What month? 0 points 0 points  What time? 0 points 0 points  Count back from 20 0 points 0 points  Months in reverse 0 points 0 points  Repeat phrase 2 points 0 points  Total Score 2 0    Immunization History  Administered Date(s) Administered  . Influenza, High Dose Seasonal PF 08/17/2017  . Influenza,inj,Quad PF,6+ Mos 09/16/2015, 09/23/2016  . Pneumococcal Conjugate-13 09/10/2014  . Pneumococcal Polysaccharide-23 10/29/2003  . Pneumococcal-Unspecified 09/04/2010  . Tdap 10/26/2008  . Zoster 05/07/2011    Qualifies for Shingles Vaccine? Yes. Zostavax completed 05/07/11. Due for Shingrix. Education has been provided regarding the importance of this vaccine. Pt has been advised to call insurance company to determine out of pocket expense. Advised may also receive vaccine at local pharmacy or Health Dept. Verbalized acceptance and understanding.  Screening Tests Health Maintenance  Topic Date Due  . URINE MICROALBUMIN  11/03/1944  . HEMOGLOBIN A1C  01/28/2018  . INFLUENZA VACCINE  05/26/2018  . FOOT EXAM  06/02/2018  . TETANUS/TDAP  10/26/2018  . OPHTHALMOLOGY EXAM  12/09/2018  . PNA vac Low Risk Adult  Completed  . DEXA SCAN  Addressed    Cancer Screenings: Lung: Low Dose CT Chest recommended if Age 53-80 years, 30 pack-year currently smoking OR have quit w/in 15years. Patient does not qualify. Breast:  Up to date on Mammogram? No longer required   Up to date of Bone Density/Dexa? No. Completed 10/27/09. Unable to locate report. Ordered today. Pt aware that she will receive a call from our office re: her appt Colorectal: No longer required  Additional Screenings: Hepatitis C Screening: Does not qualify    Plan:  I have personally reviewed and addressed the Medicare Annual Wellness questionnaire and have noted the following in the patient's chart:  A. Medical and social history B. Use of alcohol, tobacco or  illicit drugs  C. Current medications and supplements D. Functional ability and status E.  Nutritional status F.  Physical activity G. Advance directives H. List of other physicians I.  Hospitalizations, surgeries, and ER visits in previous 12 months J.  [place] such as hearing and vision if needed, cognitive and depression L. Referrals and appointments  In addition, I have reviewed and discussed with patient certain preventive protocols, quality metrics, and best practice recommendations. A written personalized care plan for preventive services as well as general preventive health recommendations were provided to patient.  Signed,  Aleatha Borer, LPN Nurse Health Advisor  MD Recommendations: Zostavax completed 05/07/11. Due for Shingrix. Education has been provided regarding the importance of this vaccine. Pt has been advised to call insurance company to determine out of pocket expense. Advised may also receive vaccine at local pharmacy or Health Dept. Verbalized acceptance and understanding.  Diabetic Foot Exam: Completed 06/02/17. Pt has been advised about the importance in completing this exam. Pthas requested to schedule her own appt with Dr. Vickki Muff.  Bone Density/Dexa: Completed 10/27/09. Unable to locate report. Ordered today. Pt aware that she will receive a call from our office re: her appt

## 2018-06-15 NOTE — Patient Instructions (Addendum)
Jill Hudson , Thank you for taking time to come for your Medicare Wellness Visit. I appreciate your ongoing commitment to your health goals. Please review the following plan we discussed and let me know if I can assist you in the future.   Screening recommendations/referrals: Colorectal Screening: No longer required Mammogram: No longer required Bone Density: You will receive a call from our office regarding your appointment  Vision and Dental Exams: Recommended annual ophthalmology exams for early detection of glaucoma and other disorders of the eye Recommended annual dental exams for proper oral hygiene  Diabetic Exams: Recommended annual diabetic eye exams for early detection of retinopathy Recommended annual diabetic foot exams for early detection of peripheral neuropathy.  Diabetic Eye Exam: Up to date Diabetic Foot Exam: Please schedule an appointment with Dr. Vickki Muff  Vaccinations: Influenza vaccine: Up to date Pneumococcal vaccine: Up to date Tdap vaccine: Up to date Shingles vaccine: Please call your insurance company to determine your out of pocket expense for the Shingrix vaccine. You may also receive this vaccine at your local pharmacy or Health Dept.    Advanced directives: Please bring a copy of your POA (Power of Attorney) and/or Living Will to your next appointment.  Goals: Recommend to drink at least 6-8 8oz glasses of water per day.  Next appointment: Please schedule your Annual Wellness Visit with your Nurse Health Advisor in one year.  Preventive Care 70 Years and Older, Female Preventive care refers to lifestyle choices and visits with your health care provider that can promote health and wellness. What does preventive care include?  A yearly physical exam. This is also called an annual well check.  Dental exams once or twice a year.  Routine eye exams. Ask your health care provider how often you should have your eyes checked.  Personal lifestyle choices,  including:  Daily care of your teeth and gums.  Regular physical activity.  Eating a healthy diet.  Avoiding tobacco and drug use.  Limiting alcohol use.  Practicing safe sex.  Taking low-dose aspirin every day.  Taking vitamin and mineral supplements as recommended by your health care provider. What happens during an annual well check? The services and screenings done by your health care provider during your annual well check will depend on your age, overall health, lifestyle risk factors, and family history of disease. Counseling  Your health care provider may ask you questions about your:  Alcohol use.  Tobacco use.  Drug use.  Emotional well-being.  Home and relationship well-being.  Sexual activity.  Eating habits.  History of falls.  Memory and ability to understand (cognition).  Work and work Statistician.  Reproductive health. Screening  You may have the following tests or measurements:  Height, weight, and BMI.  Blood pressure.  Lipid and cholesterol levels. These may be checked every 5 years, or more frequently if you are over 97 years old.  Skin check.  Lung cancer screening. You may have this screening every year starting at age 73 if you have a 30-pack-year history of smoking and currently smoke or have quit within the past 15 years.  Fecal occult blood test (FOBT) of the stool. You may have this test every year starting at age 54.  Flexible sigmoidoscopy or colonoscopy. You may have a sigmoidoscopy every 5 years or a colonoscopy every 10 years starting at age 23.  Hepatitis C blood test.  Hepatitis B blood test.  Sexually transmitted disease (STD) testing.  Diabetes screening. This is done by checking  your blood sugar (glucose) after you have not eaten for a while (fasting). You may have this done every 1-3 years.  Bone density scan. This is done to screen for osteoporosis. You may have this done starting at age 86.  Mammogram. This  may be done every 1-2 years. Talk to your health care provider about how often you should have regular mammograms. Talk with your health care provider about your test results, treatment options, and if necessary, the need for more tests. Vaccines  Your health care provider may recommend certain vaccines, such as:  Influenza vaccine. This is recommended every year.  Tetanus, diphtheria, and acellular pertussis (Tdap, Td) vaccine. You may need a Td booster every 10 years.  Zoster vaccine. You may need this after age 81.  Pneumococcal 13-valent conjugate (PCV13) vaccine. One dose is recommended after age 61.  Pneumococcal polysaccharide (PPSV23) vaccine. One dose is recommended after age 56. Talk to your health care provider about which screenings and vaccines you need and how often you need them. This information is not intended to replace advice given to you by your health care provider. Make sure you discuss any questions you have with your health care provider. Document Released: 11/08/2015 Document Revised: 07/01/2016 Document Reviewed: 08/13/2015 Elsevier Interactive Patient Education  2017 [place] Prevention in the Home Falls can cause injuries. They can happen to people of all ages. There are many things you can do to make your home safe and to help prevent falls. What can I do on the outside of my home?  Regularly fix the edges of walkways and driveways and fix any cracks.  Remove anything that might make you trip as you walk through a door, such as a raised step or threshold.  Trim any bushes or trees on the path to your home.  Use bright outdoor lighting.  Clear any walking paths of anything that might make someone trip, such as rocks or tools.  Regularly check to see if handrails are loose or broken. Make sure that both sides of any steps have handrails.  Any raised decks and porches should have guardrails on the edges.  Have any leaves, snow, or ice cleared  regularly.  Use sand or salt on walking paths during winter.  Clean up any spills in your garage right away. This includes oil or grease spills. What can I do in the bathroom?  Use night lights.  Install grab bars by the toilet and in the tub and shower. Do not use towel bars as grab bars.  Use non-skid mats or decals in the tub or shower.  If you need to sit down in the shower, use a plastic, non-slip stool.  Keep the floor dry. Clean up any water that spills on the floor as soon as it happens.  Remove soap buildup in the tub or shower regularly.  Attach bath mats securely with double-sided non-slip rug tape.  Do not have throw rugs and other things on the floor that can make you trip. What can I do in the bedroom?  Use night lights.  Make sure that you have a light by your bed that is easy to reach.  Do not use any sheets or blankets that are too big for your bed. They should not hang down onto the floor.  Have a firm chair that has side arms. You can use this for support while you get dressed.  Do not have throw rugs and other things on the floor  that can make you trip. What can I do in the kitchen?  Clean up any spills right away.  Avoid walking on wet floors.  Keep items that you use a lot in easy-to-reach places.  If you need to reach something above you, use a strong step stool that has a grab bar.  Keep electrical cords out of the way.  Do not use floor polish or wax that makes floors slippery. If you must use wax, use non-skid floor wax.  Do not have throw rugs and other things on the floor that can make you trip. What can I do with my stairs?  Do not leave any items on the stairs.  Make sure that there are handrails on both sides of the stairs and use them. Fix handrails that are broken or loose. Make sure that handrails are as long as the stairways.  Check any carpeting to make sure that it is firmly attached to the stairs. Fix any carpet that is loose  or worn.  Avoid having throw rugs at the top or bottom of the stairs. If you do have throw rugs, attach them to the floor with carpet tape.  Make sure that you have a light switch at the top of the stairs and the bottom of the stairs. If you do not have them, ask someone to add them for you. What else can I do to help prevent falls?  Wear shoes that:  Do not have high heels.  Have rubber bottoms.  Are comfortable and fit you well.  Are closed at the toe. Do not wear sandals.  If you use a stepladder:  Make sure that it is fully opened. Do not climb a closed stepladder.  Make sure that both sides of the stepladder are locked into place.  Ask someone to hold it for you, if possible.  Clearly mark and make sure that you can see:  Any grab bars or handrails.  First and last steps.  Where the edge of each step is.  Use tools that help you move around (mobility aids) if they are needed. These include:  Canes.  Walkers.  Scooters.  Crutches.  Turn on the lights when you go into a dark area. Replace any light bulbs as soon as they burn out.  Set up your furniture so you have a clear path. Avoid moving your furniture around.  If any of your floors are uneven, fix them.  If there are any pets around you, be aware of where they are.  Review your medicines with your doctor. Some medicines can make you feel dizzy. This can increase your chance of falling. Ask your doctor what other things that you can do to help prevent falls. This information is not intended to replace advice given to you by your health care provider. Make sure you discuss any questions you have with your health care provider. Document Released: 08/08/2009 Document Revised: 03/19/2016 Document Reviewed: 11/16/2014 Elsevier Interactive Patient Education  2017 [employer].

## 2018-06-20 DIAGNOSIS — Z85828 Personal history of other malignant neoplasm of skin: Secondary | ICD-10-CM | POA: Diagnosis not present

## 2018-06-20 DIAGNOSIS — L578 Other skin changes due to chronic exposure to nonionizing radiation: Secondary | ICD-10-CM | POA: Diagnosis not present

## 2018-06-20 DIAGNOSIS — Z1283 Encounter for screening for malignant neoplasm of skin: Secondary | ICD-10-CM | POA: Diagnosis not present

## 2018-07-04 ENCOUNTER — Other Ambulatory Visit: Payer: Self-pay | Admitting: Internal Medicine

## 2018-07-04 DIAGNOSIS — Z794 Long term (current) use of insulin: Secondary | ICD-10-CM | POA: Diagnosis not present

## 2018-07-04 DIAGNOSIS — E785 Hyperlipidemia, unspecified: Secondary | ICD-10-CM | POA: Diagnosis not present

## 2018-07-04 DIAGNOSIS — E1169 Type 2 diabetes mellitus with other specified complication: Secondary | ICD-10-CM | POA: Diagnosis not present

## 2018-07-04 DIAGNOSIS — I10 Essential (primary) hypertension: Secondary | ICD-10-CM | POA: Diagnosis not present

## 2018-07-04 DIAGNOSIS — E1159 Type 2 diabetes mellitus with other circulatory complications: Secondary | ICD-10-CM | POA: Diagnosis not present

## 2018-07-04 DIAGNOSIS — E1142 Type 2 diabetes mellitus with diabetic polyneuropathy: Secondary | ICD-10-CM | POA: Diagnosis not present

## 2018-07-05 ENCOUNTER — Ambulatory Visit (INDEPENDENT_AMBULATORY_CARE_PROVIDER_SITE_OTHER): Payer: Medicare Other | Admitting: Nurse Practitioner

## 2018-07-05 ENCOUNTER — Ambulatory Visit (INDEPENDENT_AMBULATORY_CARE_PROVIDER_SITE_OTHER): Payer: Medicare Other

## 2018-07-05 ENCOUNTER — Encounter

## 2018-07-05 ENCOUNTER — Encounter (INDEPENDENT_AMBULATORY_CARE_PROVIDER_SITE_OTHER): Payer: Self-pay | Admitting: Nurse Practitioner

## 2018-07-05 VITALS — BP 136/61 | HR 56 | Resp 13 | Ht 61.5 in | Wt 122.0 lb

## 2018-07-05 DIAGNOSIS — E782 Mixed hyperlipidemia: Secondary | ICD-10-CM

## 2018-07-05 DIAGNOSIS — I739 Peripheral vascular disease, unspecified: Secondary | ICD-10-CM

## 2018-07-05 DIAGNOSIS — I10 Essential (primary) hypertension: Secondary | ICD-10-CM

## 2018-07-05 DIAGNOSIS — I6523 Occlusion and stenosis of bilateral carotid arteries: Secondary | ICD-10-CM

## 2018-07-05 DIAGNOSIS — K219 Gastro-esophageal reflux disease without esophagitis: Secondary | ICD-10-CM | POA: Diagnosis not present

## 2018-07-05 NOTE — Progress Notes (Signed)
Subjective:    Patient ID: Jill Hudson, female    DOB: 28-Aug-1935, 83 y.o.   MRN: [id number] Chief Complaint  Patient presents with  . Follow-up    ABI follow up    HPI  Jill Hudson is a 83 y.o. female who returns to the office for followup and review of the noninvasive studies. There has been a significant deterioration in the lower extremity symptoms.  The patient notes interval shortening of their claudication distance and development of mild rest pain symptoms. No new ulcers or wounds have occurred since the last visit.  The patient reports that the symptoms are equally bad on both legs, however her left foot has begun to become numb.  There have been no significant changes to the patient's overall health care.  The patient denies amaurosis fugax or recent TIA symptoms. There are no recent neurological changes noted. The patient denies history of DVT, PE or superficial thrombophlebitis. The patient denies recent episodes of angina or shortness of breath.   ABI's Rt=0.88 and Lt=0.65 (previous ABI's Rt=1.01 and Lt=0.73) The patient has biphasic waveforms on her right lower extremity with monophasic waveforms on her left.  Her left digit waveform is flat.  Her great toe pressures are 30 compared with previous pressures of 52. Constitutional: [] Weight loss  [] Fever  [] Chills Cardiac: [] Chest pain   [] Chest pressure   [] Palpitations   [] Shortness of breath when laying flat   [] Shortness of breath with exertion. Vascular:  [x] Pain in legs with walking   [] Pain in legs with standing  [] History of DVT   [] Phlebitis   [] Swelling in legs   [] Varicose veins   [] Non-healing ulcers Pulmonary:   [] Uses home oxygen   [] Productive cough   [] Hemoptysis   [] Wheeze  [] COPD   [] Asthma Neurologic:  [] Dizziness   [] Seizures   [] History of stroke   [] History of TIA  [] Aphasia   [] Vissual changes   [] Weakness or numbness in arm   [x] Weakness or numbness in leg Musculoskeletal:   [] Joint swelling   [] Joint  pain   [] Low back pain Hematologic:  [] Easy bruising  [] Easy bleeding   [] Hypercoagulable state   [] Anemic Gastrointestinal:  [] Diarrhea   [] Vomiting  [] Gastroesophageal reflux/heartburn   [] Difficulty swallowing. Genitourinary:  [] Chronic kidney disease   [] Difficult urination  [] Frequent urination   [] Blood in urine Skin:  [] Rashes   [] Ulcers  Psychological:  [] History of anxiety   []  History of major depression.     Objective:   Physical Exam  BP 136/61 (BP Location: Right Arm, Patient Position: Sitting)   Pulse (!) 56   Resp 13   Ht 5' 1.5" (1.562 m)   Wt 122 lb (55.3 kg)   BMI 22.68 kg/m   Past Medical History:  Diagnosis Date  . Anxiety   . Arthritis   . Cancer ([place])    skin  . Diabetes mellitus without complication ([name])   . Hyperlipidemia   . Hypertension   . Mitral and aortic valve disease   . Occasional tremors   . Shingles   . TIA (transient ischemic attack) 01/2017  . UTI (urinary tract infection)   . Vascular disease, peripheral ([place])      Gen: WD/WN, NAD Head: [place]/AT, No temporalis wasting.  Ear/Nose/Throat: Hearing grossly intact, nares w/o erythema or drainage Eyes: PER, EOMI, sclera nonicteric.  Neck: Supple, no masses.  No JVD.  Pulmonary:  Good air movement, no use of accessory muscles.  Cardiac: RRR Vascular:  Vessel  Right Left  PT  not palpable  not palpable  DP  not palpable  not  palpable  Gastrointestinal: soft, non-distended. No guarding/no peritoneal signs.  Musculoskeletal: M/S 5/5 throughout.  No deformity or atrophy.  Neurologic: Pain and light touch intact in extremities.  Symmetrical.  Speech is fluent. Motor exam as listed above. Psychiatric: Judgment intact, Mood & affect appropriate for pt's clinical situation. Dermatologic: No Venous rashes. No Ulcers Noted.  No changes consistent with cellulitis. Lymph : No Cervical lymphadenopathy, no lichenification or skin changes of chronic lymphedema.   Social History   Socioeconomic  History  . Marital status: Married    Spouse name: Not on file  . Number of children: 3  . Years of education: Not on file  . Highest education level: 12th grade  Occupational History  . Occupation: Retired  Scientific laboratory technician  . Financial resource strain: Not hard at all  . Food insecurity:    Worry: Never true    Inability: Never true  . Transportation needs:    Medical: No    Non-medical: No  Tobacco Use  . Smoking status: Former Smoker    Packs/day: 2.00    Years: 37.00    Pack years: 74.00    Types: Cigarettes    Last attempt to quit: 1980    Years since quitting: 39.7  . Smokeless tobacco: Never Used  . Tobacco comment: smoking cessation materials not required  Substance and Sexual Activity  . Alcohol use: No    Alcohol/week: 0.0 standard drinks  . Drug use: No  . Sexual activity: Not Currently  Lifestyle  . Physical activity:    Days per week: 0 days    Minutes per session: 0 min  . Stress: Not at all  Relationships  . Social connections:    Talks on phone: Patient refused    Gets together: Patient refused    Attends religious service: Patient refused    Active member of club or organization: Patient refused    Attends meetings of clubs or organizations: Patient refused    Relationship status: Married  . Intimate partner violence:    Fear of current or ex partner: No    Emotionally abused: No    Physically abused: No    Forced sexual activity: No  Other Topics Concern  . Not on file  Social History Narrative  . Not on file    Past Surgical History:  Procedure Laterality Date  . CARDIAC CATHETERIZATION  1998   40% LM, 95% Ramus interm  . CATARACT EXTRACTION, BILATERAL    . EYE SURGERY    . LOWER EXTREMITY ANGIOGRAPHY Left 08/23/2017   Procedure: Lower Extremity Angiography;  Surgeon: Algernon Huxley, MD;  Location: [facility];  Service: Cardiovascular;  Laterality: Left;  . PTCA  08/2013   Left common iliac  . PTCA  12/2012   left ext iliac  .  TUBAL LIGATION      Family History  Problem Relation Age of Onset  . Dementia Mother   . Diabetes Father     Allergies  Allergen Reactions  . Saxagliptin Diarrhea  . Atorvastatin Other (See Comments)    Muscle aches  . Codeine Other (See Comments)    Upset stomach  . Epinephrine Other (See Comments)    Unknown  . Ezetimibe Other (See Comments)    myalgias  . Nitrofurantoin Other (See Comments)    Pruitus, rash  . Limonene Rash  . Sulfa Antibiotics Rash and  Other (See Comments)    Sore mouth        Assessment & Plan:  1. PAD (peripheral artery disease) (HCC) Recommend:  The patient has experienced increased symptoms and is now describing lifestyle limiting claudication and mild rest pain.   Given the severity of the patient's lower extremity symptoms the patient should undergo angiography and intervention.  Risk and benefits were reviewed the patient.  Indications for the procedure were reviewed.  All questions were answered, the patient agrees to proceed.   The patient should continue walking and begin a more formal exercise program.  The patient should continue antiplatelet therapy and aggressive treatment of the lipid abnormalities  The patient will follow up with me after the angiogram.  If following angiogram her weakness and pain is relieved on the left, we will explore moving forward with angiogram of her right lower extremity.  2. Essential (primary) hypertension Continue antihypertensive medications as already ordered, these medications have been reviewed and there are no changes at this time.   3. Mixed hyperlipidemia Continue statin as ordered and reviewed, no changes at this time   4. Gastroesophageal reflux disease, esophagitis presence not specified Continue PPI as already ordered, this medication has been reviewed and there are no changes at this time.  Avoidence of caffeine and alcohol  Moderate elevation of the head of the bed     Current  Outpatient Medications on File Prior to Visit  Medication Sig Dispense Refill  . acetaminophen (TYLENOL) 650 MG CR tablet Take 1,300 mg by mouth every 8 (eight) hours as needed for pain.    Marland Kitchen amLODipine (NORVASC) 2.5 MG tablet Take 2.5 mg by mouth daily.    Marland Kitchen aspirin 81 MG tablet Take 81 mg by mouth daily.     . B Complex Vitamins (VITAMIN-B COMPLEX) TABS Take 1 tablet by mouth daily.     . baclofen (LIORESAL) 10 MG tablet TAKE 1 TABLET(10 MG) BY MOUTH THREE TIMES DAILY 270 tablet 0  . Biotin 10 MG TABS Take 10 mg by mouth daily.     . chlorthalidone (HYGROTON) 25 MG tablet Take 25 mg by mouth daily.    . Cholecalciferol (VITAMIN D3 PO) Take 1 capsule by mouth 2 (two) times daily.    . clopidogrel (PLAVIX) 75 MG tablet TAKE ONE TABLET BY MOUTH EVERY DAY 30 tablet 11  . Cyanocobalamin (RA VITAMIN B-12 TR) 1000 MCG TBCR Take 1,000 mcg by mouth daily.     Marland Kitchen diltiazem (CARTIA XT) 180 MG 24 hr capsule Take 180 mg by mouth 2 (two) times daily.     . insulin aspart (NOVOLOG FLEXPEN) 100 UNIT/ML FlexPen Inject 6 Units into the skin 3 (three) times daily before meals.     . insulin glargine (LANTUS) 100 unit/mL SOPN Inject 12 Units into the skin daily.    . Insulin Pen Needle (B-D ULTRAFINE III SHORT PEN) 31G X 8 MM MISC USE AS DIRECTED( APPLY BY DOSE NOT DAILY) 100 each 3  . loratadine (CLARITIN) 10 MG tablet Take 10 mg by mouth daily as needed for allergies.     Marland Kitchen losartan (COZAAR) 50 MG tablet     . metFORMIN (GLUCOPHAGE-XR) 500 MG 24 hr tablet TAKE ONE TABLET BY MOUTH TWICE DAILY 60 tablet 11  . Multiple Vitamins-Minerals (PRESERVISION AREDS PO) Take 1 capsule by mouth 2 (two) times daily.     . Omega 3 1000 MG CAPS Take 1,000 mg by mouth daily.     . predniSONE (  DELTASONE) 10 MG tablet Take 6 on day 1, 5 on day 2, 4 on day 3, 3 on day 4, 2 on day 5 and 1 on day 1 then stop. 21 tablet 0  . rosuvastatin (CRESTOR) 5 MG tablet Take 10 mg by mouth 4 (four) times a week.     No current  facility-administered medications on file prior to visit.     There are no Patient Instructions on file for this visit. No follow-ups on file.   Kris Hartmann, NP

## 2018-07-06 ENCOUNTER — Ambulatory Visit
Admission: RE | Admit: 2018-07-06 | Discharge: 2018-07-06 | Disposition: A | Discharge status: Home / Self Care | Payer: Medicare Other | Source: Ambulatory Visit | Attending: Internal Medicine | Admitting: Internal Medicine

## 2018-07-06 DIAGNOSIS — E2839 Other primary ovarian failure: Secondary | ICD-10-CM | POA: Diagnosis not present

## 2018-07-06 DIAGNOSIS — M85832 Other specified disorders of bone density and structure, left forearm: Secondary | ICD-10-CM | POA: Diagnosis not present

## 2018-07-07 ENCOUNTER — Encounter (INDEPENDENT_AMBULATORY_CARE_PROVIDER_SITE_OTHER): Payer: Self-pay

## 2018-07-22 ENCOUNTER — Other Ambulatory Visit (INDEPENDENT_AMBULATORY_CARE_PROVIDER_SITE_OTHER): Payer: Self-pay | Admitting: Nurse Practitioner

## 2018-07-22 DIAGNOSIS — I2510 Atherosclerotic heart disease of native coronary artery without angina pectoris: Secondary | ICD-10-CM | POA: Diagnosis not present

## 2018-07-22 DIAGNOSIS — E785 Hyperlipidemia, unspecified: Secondary | ICD-10-CM | POA: Diagnosis not present

## 2018-07-22 DIAGNOSIS — Z23 Encounter for immunization: Secondary | ICD-10-CM | POA: Diagnosis not present

## 2018-07-22 DIAGNOSIS — I10 Essential (primary) hypertension: Secondary | ICD-10-CM | POA: Diagnosis not present

## 2018-07-22 DIAGNOSIS — I739 Peripheral vascular disease, unspecified: Secondary | ICD-10-CM | POA: Diagnosis not present

## 2018-07-25 ENCOUNTER — Encounter
Admission: RE | Admit: 2018-07-25 | Discharge: 2018-07-25 | Disposition: A | Discharge status: Home / Self Care | Payer: Medicare Other | Source: Ambulatory Visit | Attending: Vascular Surgery | Admitting: Vascular Surgery

## 2018-07-25 DIAGNOSIS — K219 Gastro-esophageal reflux disease without esophagitis: Secondary | ICD-10-CM | POA: Diagnosis not present

## 2018-07-25 DIAGNOSIS — Z882 Allergy status to sulfonamides status: Secondary | ICD-10-CM | POA: Diagnosis not present

## 2018-07-25 DIAGNOSIS — Z823 Family history of stroke: Secondary | ICD-10-CM | POA: Diagnosis not present

## 2018-07-25 DIAGNOSIS — Z79899 Other long term (current) drug therapy: Secondary | ICD-10-CM | POA: Diagnosis not present

## 2018-07-25 DIAGNOSIS — I70223 Atherosclerosis of native arteries of extremities with rest pain, bilateral legs: Secondary | ICD-10-CM | POA: Diagnosis not present

## 2018-07-25 DIAGNOSIS — Z85828 Personal history of other malignant neoplasm of skin: Secondary | ICD-10-CM | POA: Diagnosis not present

## 2018-07-25 DIAGNOSIS — Z9851 Tubal ligation status: Secondary | ICD-10-CM | POA: Diagnosis not present

## 2018-07-25 DIAGNOSIS — Z9842 Cataract extraction status, left eye: Secondary | ICD-10-CM | POA: Diagnosis not present

## 2018-07-25 DIAGNOSIS — Z87440 Personal history of urinary (tract) infections: Secondary | ICD-10-CM | POA: Diagnosis not present

## 2018-07-25 DIAGNOSIS — Z8619 Personal history of other infectious and parasitic diseases: Secondary | ICD-10-CM | POA: Diagnosis not present

## 2018-07-25 DIAGNOSIS — I10 Essential (primary) hypertension: Secondary | ICD-10-CM | POA: Diagnosis not present

## 2018-07-25 DIAGNOSIS — E785 Hyperlipidemia, unspecified: Secondary | ICD-10-CM | POA: Diagnosis not present

## 2018-07-25 DIAGNOSIS — R251 Tremor, unspecified: Secondary | ICD-10-CM | POA: Diagnosis not present

## 2018-07-25 DIAGNOSIS — Z955 Presence of coronary angioplasty implant and graft: Secondary | ICD-10-CM | POA: Diagnosis not present

## 2018-07-25 DIAGNOSIS — M1990 Unspecified osteoarthritis, unspecified site: Secondary | ICD-10-CM | POA: Diagnosis not present

## 2018-07-25 DIAGNOSIS — Z794 Long term (current) use of insulin: Secondary | ICD-10-CM | POA: Diagnosis not present

## 2018-07-25 DIAGNOSIS — Z87891 Personal history of nicotine dependence: Secondary | ICD-10-CM | POA: Diagnosis not present

## 2018-07-25 DIAGNOSIS — Z9841 Cataract extraction status, right eye: Secondary | ICD-10-CM | POA: Diagnosis not present

## 2018-07-25 DIAGNOSIS — Z885 Allergy status to narcotic agent status: Secondary | ICD-10-CM | POA: Diagnosis not present

## 2018-07-25 DIAGNOSIS — Z8673 Personal history of transient ischemic attack (TIA), and cerebral infarction without residual deficits: Secondary | ICD-10-CM | POA: Diagnosis not present

## 2018-07-25 DIAGNOSIS — Z7902 Long term (current) use of antithrombotics/antiplatelets: Secondary | ICD-10-CM | POA: Diagnosis not present

## 2018-07-25 DIAGNOSIS — Z9889 Other specified postprocedural states: Secondary | ICD-10-CM | POA: Diagnosis not present

## 2018-07-25 DIAGNOSIS — Z7982 Long term (current) use of aspirin: Secondary | ICD-10-CM | POA: Diagnosis not present

## 2018-07-25 DIAGNOSIS — E119 Type 2 diabetes mellitus without complications: Secondary | ICD-10-CM | POA: Diagnosis not present

## 2018-07-25 HISTORY — DX: Acute myocardial infarction, unspecified: I21.9

## 2018-07-25 HISTORY — DX: Cardiac murmur, unspecified: R01.1

## 2018-07-25 HISTORY — DX: Cerebral infarction, unspecified: I63.9

## 2018-07-25 LAB — CREATININE, SERUM
Creatinine, Ser: 1.08 mg/dL — ABNORMAL HIGH (ref 0.44–1.00)
GFR calc Af Amer: 54 mL/min — ABNORMAL LOW (ref >60)
GFR calc non Af Amer: 47 mL/min — ABNORMAL LOW (ref >60)

## 2018-07-25 LAB — BUN: BUN: 26 mg/dL — AB (ref 8–23)

## 2018-07-26 ENCOUNTER — Other Ambulatory Visit: Payer: Self-pay | Admitting: Internal Medicine

## 2018-07-26 ENCOUNTER — Ambulatory Visit
Admission: RE | Admit: 2018-07-26 | Discharge: 2018-07-26 | Disposition: A | Discharge status: Home / Self Care | Payer: Medicare Other | Source: Ambulatory Visit | Attending: Vascular Surgery | Admitting: Vascular Surgery

## 2018-07-26 ENCOUNTER — Encounter
Admission: RE | Disposition: A | Discharge status: Home / Self Care | Payer: Self-pay | Source: Ambulatory Visit | Attending: Vascular Surgery

## 2018-07-26 DIAGNOSIS — I10 Essential (primary) hypertension: Secondary | ICD-10-CM | POA: Diagnosis not present

## 2018-07-26 DIAGNOSIS — Z9851 Tubal ligation status: Secondary | ICD-10-CM | POA: Insufficient documentation

## 2018-07-26 DIAGNOSIS — Z7902 Long term (current) use of antithrombotics/antiplatelets: Secondary | ICD-10-CM | POA: Insufficient documentation

## 2018-07-26 DIAGNOSIS — Z882 Allergy status to sulfonamides status: Secondary | ICD-10-CM | POA: Insufficient documentation

## 2018-07-26 DIAGNOSIS — Z8673 Personal history of transient ischemic attack (TIA), and cerebral infarction without residual deficits: Secondary | ICD-10-CM | POA: Insufficient documentation

## 2018-07-26 DIAGNOSIS — K219 Gastro-esophageal reflux disease without esophagitis: Secondary | ICD-10-CM | POA: Insufficient documentation

## 2018-07-26 DIAGNOSIS — Z794 Long term (current) use of insulin: Secondary | ICD-10-CM | POA: Insufficient documentation

## 2018-07-26 DIAGNOSIS — E785 Hyperlipidemia, unspecified: Secondary | ICD-10-CM | POA: Diagnosis not present

## 2018-07-26 DIAGNOSIS — Z9842 Cataract extraction status, left eye: Secondary | ICD-10-CM | POA: Insufficient documentation

## 2018-07-26 DIAGNOSIS — Z9889 Other specified postprocedural states: Secondary | ICD-10-CM | POA: Insufficient documentation

## 2018-07-26 DIAGNOSIS — I70223 Atherosclerosis of native arteries of extremities with rest pain, bilateral legs: Secondary | ICD-10-CM | POA: Diagnosis not present

## 2018-07-26 DIAGNOSIS — R251 Tremor, unspecified: Secondary | ICD-10-CM | POA: Insufficient documentation

## 2018-07-26 DIAGNOSIS — Z8619 Personal history of other infectious and parasitic diseases: Secondary | ICD-10-CM | POA: Insufficient documentation

## 2018-07-26 DIAGNOSIS — Z955 Presence of coronary angioplasty implant and graft: Secondary | ICD-10-CM | POA: Insufficient documentation

## 2018-07-26 DIAGNOSIS — Z888 Allergy status to other drugs, medicaments and biological substances status: Secondary | ICD-10-CM | POA: Insufficient documentation

## 2018-07-26 DIAGNOSIS — Z79899 Other long term (current) drug therapy: Secondary | ICD-10-CM | POA: Insufficient documentation

## 2018-07-26 DIAGNOSIS — Z87440 Personal history of urinary (tract) infections: Secondary | ICD-10-CM | POA: Insufficient documentation

## 2018-07-26 DIAGNOSIS — Z87891 Personal history of nicotine dependence: Secondary | ICD-10-CM | POA: Insufficient documentation

## 2018-07-26 DIAGNOSIS — Z885 Allergy status to narcotic agent status: Secondary | ICD-10-CM | POA: Insufficient documentation

## 2018-07-26 DIAGNOSIS — Z823 Family history of stroke: Secondary | ICD-10-CM | POA: Insufficient documentation

## 2018-07-26 DIAGNOSIS — M1990 Unspecified osteoarthritis, unspecified site: Secondary | ICD-10-CM | POA: Insufficient documentation

## 2018-07-26 DIAGNOSIS — E119 Type 2 diabetes mellitus without complications: Secondary | ICD-10-CM | POA: Insufficient documentation

## 2018-07-26 DIAGNOSIS — Z9841 Cataract extraction status, right eye: Secondary | ICD-10-CM | POA: Insufficient documentation

## 2018-07-26 DIAGNOSIS — I70219 Atherosclerosis of native arteries of extremities with intermittent claudication, unspecified extremity: Secondary | ICD-10-CM

## 2018-07-26 DIAGNOSIS — Z7982 Long term (current) use of aspirin: Secondary | ICD-10-CM | POA: Insufficient documentation

## 2018-07-26 DIAGNOSIS — Z85828 Personal history of other malignant neoplasm of skin: Secondary | ICD-10-CM | POA: Insufficient documentation

## 2018-07-26 HISTORY — PX: LOWER EXTREMITY ANGIOGRAPHY: CATH118251

## 2018-07-26 LAB — GLUCOSE, CAPILLARY: Glucose-Capillary: 148 mg/dL — ABNORMAL HIGH (ref 70–99)

## 2018-07-26 SURGERY — LOWER EXTREMITY ANGIOGRAPHY
Anesthesia: Moderate Sedation | Site: Leg Lower | Laterality: Left

## 2018-07-26 MED ORDER — HYDRALAZINE HCL 20 MG/ML IJ SOLN: 5.0000 mg | INTRAMUSCULAR | Status: DC | PRN

## 2018-07-26 MED ORDER — IOPAMIDOL (ISOVUE-300) INJECTION 61%
INTRAVENOUS | Status: DC | PRN
  Administered 2018-07-26: 100 mL via INTRA_ARTERIAL

## 2018-07-26 MED ORDER — ACETAMINOPHEN 325 MG PO TABS
ORAL_TABLET | ORAL | Status: AC
  Filled 2018-07-26: qty 2

## 2018-07-26 MED ORDER — MORPHINE SULFATE (PF) 4 MG/ML IV SOLN: 2.0000 mg | INTRAVENOUS | Status: DC | PRN

## 2018-07-26 MED ORDER — HEPARIN (PORCINE) IN NACL 1000-0.9 UT/500ML-% IV SOLN
INTRAVENOUS | Status: AC
  Filled 2018-07-26: qty 1000

## 2018-07-26 MED ORDER — CLOPIDOGREL BISULFATE 300 MG PO TABS
300.0000 mg | ORAL_TABLET | ORAL | Status: AC
  Administered 2018-07-26: 300 mg via ORAL

## 2018-07-26 MED ORDER — FENTANYL CITRATE (PF) 100 MCG/2ML IJ SOLN
INTRAMUSCULAR | Status: DC | PRN
  Administered 2018-07-26: 50 ug via INTRAVENOUS

## 2018-07-26 MED ORDER — FENTANYL CITRATE (PF) 100 MCG/2ML IJ SOLN
INTRAMUSCULAR | Status: AC
  Filled 2018-07-26: qty 2

## 2018-07-26 MED ORDER — ACETAMINOPHEN 325 MG PO TABS
650.0000 mg | ORAL_TABLET | ORAL | Status: DC | PRN
  Administered 2018-07-26: 650 mg via ORAL

## 2018-07-26 MED ORDER — SODIUM CHLORIDE FLUSH 0.9 % IV SOLN
INTRAVENOUS | Status: AC
  Filled 2018-07-26: qty 20

## 2018-07-26 MED ORDER — ONDANSETRON HCL 4 MG/2ML IJ SOLN: 4.0000 mg | Freq: Four times a day (QID) | INTRAMUSCULAR | Status: DC | PRN

## 2018-07-26 MED ORDER — CLOPIDOGREL BISULFATE 75 MG PO TABS: 75.0000 mg | ORAL_TABLET | Freq: Every day | ORAL | 4 refills | Status: DC

## 2018-07-26 MED ORDER — SODIUM CHLORIDE 0.9 % IV SOLN: 250.0000 mL | INTRAVENOUS | Status: DC | PRN

## 2018-07-26 MED ORDER — OXYCODONE HCL 5 MG PO TABS: 5.0000 mg | ORAL_TABLET | ORAL | Status: DC | PRN

## 2018-07-26 MED ORDER — HEPARIN SODIUM (PORCINE) 1000 UNIT/ML IJ SOLN
INTRAMUSCULAR | Status: AC
  Filled 2018-07-26: qty 1

## 2018-07-26 MED ORDER — MIDAZOLAM HCL 5 MG/5ML IJ SOLN
INTRAMUSCULAR | Status: AC
  Filled 2018-07-26: qty 5

## 2018-07-26 MED ORDER — LIDOCAINE HCL (PF) 1 % IJ SOLN
INTRAMUSCULAR | Status: AC
  Filled 2018-07-26: qty 30

## 2018-07-26 MED ORDER — HEPARIN SODIUM (PORCINE) 1000 UNIT/ML IJ SOLN
INTRAMUSCULAR | Status: DC | PRN
  Administered 2018-07-26: 4000 [IU] via INTRAVENOUS

## 2018-07-26 MED ORDER — SODIUM CHLORIDE 0.9 % IV SOLN: INTRAVENOUS | Status: AC

## 2018-07-26 MED ORDER — HYDROMORPHONE HCL 1 MG/ML IJ SOLN: 1.0000 mg | Freq: Once | INTRAMUSCULAR | Status: DC | PRN

## 2018-07-26 MED ORDER — LABETALOL HCL 5 MG/ML IV SOLN: 10.0000 mg | INTRAVENOUS | Status: DC | PRN

## 2018-07-26 MED ORDER — DEXTROSE 5 % IV SOLN
2.0000 g | Freq: Once | INTRAVENOUS | Status: AC
  Administered 2018-07-26: 2 g via INTRAVENOUS
  Filled 2018-07-26: qty 20

## 2018-07-26 MED ORDER — CLOPIDOGREL BISULFATE 75 MG PO TABS
ORAL_TABLET | ORAL | Status: AC
  Filled 2018-07-26: qty 4

## 2018-07-26 MED ORDER — MIDAZOLAM HCL 2 MG/2ML IJ SOLN
INTRAMUSCULAR | Status: DC | PRN
  Administered 2018-07-26: 2 mg via INTRAVENOUS

## 2018-07-26 MED ORDER — SODIUM CHLORIDE 0.9% FLUSH: 3.0000 mL | Freq: Two times a day (BID) | INTRAVENOUS | Status: DC

## 2018-07-26 MED ORDER — SODIUM CHLORIDE 0.9% FLUSH: 3.0000 mL | INTRAVENOUS | Status: DC | PRN

## 2018-07-26 MED ORDER — SODIUM CHLORIDE 0.9 % IV SOLN
INTRAVENOUS | Status: DC
  Administered 2018-07-26: 08:00:00 via INTRAVENOUS

## 2018-07-26 SURGICAL SUPPLY — 26 items
BALLN LUTONIX DCB 6X40X130 (BALLOONS) ×2
BALLOON LUTONIX DCB 6X40X130 (BALLOONS) ×1 IMPLANT
CATH BEACON 5 .035 65 RIM TIP (CATHETERS) ×2 IMPLANT
CATH CROSSER 14S OTW 146CM (CATHETERS) ×2 IMPLANT
CATH PIG 70CM (CATHETERS) ×2 IMPLANT
CATH SEEKER .035X90 (CATHETERS) ×4 IMPLANT
CATH SIDEKICK ANG 110 GEOALIGN (CATHETERS) ×2 IMPLANT
CATH SKICK ANG 70CM (CATHETERS) ×2 IMPLANT
DEVICE PRESTO INFLATION (MISCELLANEOUS) ×2 IMPLANT
DEVICE STARCLOSE SE CLOSURE (Vascular Products) ×2 IMPLANT
DEVICE TORQUE .025-.038 (MISCELLANEOUS) ×2 IMPLANT
GLIDEWIRE ADV .035X260CM (WIRE) ×2 IMPLANT
IV NS 250ML (IV SOLUTION) ×1
IV NS 250ML BAXH (IV SOLUTION) ×1 IMPLANT
KIT FLOWMATE PROCEDURAL (KITS) ×2 IMPLANT
NEEDLE ENTRY 21GA 7CM ECHOTIP (NEEDLE) ×2 IMPLANT
PACK ANGIOGRAPHY (CUSTOM PROCEDURE TRAY) ×2 IMPLANT
SET INTRO CAPELLA COAXIAL (SET/KITS/TRAYS/PACK) ×2 IMPLANT
SHEATH ANL2 6FRX45 HC (SHEATH) ×2 IMPLANT
SHEATH BRITE TIP 5FRX11 (SHEATH) ×2 IMPLANT
SHEATH BRITE TIP 6FRX11 (SHEATH) ×2 IMPLANT
STENT LIFESTREAM 5X26X80 (Permanent Stent) ×2 IMPLANT
SYR MEDRAD MARK V 150ML (SYRINGE) ×2 IMPLANT
TUBING CONTRAST HIGH PRESS 72 (TUBING) ×2 IMPLANT
WIRE AQUATRACK .035X260CM (WIRE) ×2 IMPLANT
WIRE J 3MM .035X145CM (WIRE) ×2 IMPLANT

## 2018-07-26 NOTE — Progress Notes (Addendum)
Patient had small oozing of blood at access site rfa patient states her bladder is full and needs to void.  Assisted patient to bedside commode to void while holding pressure on rfa site.  Dr. [name] aware of change in rfa site Pt. Voided and feels relief from empty bladder, but is continuing to ooze blood from rfa site. Manual  Pressure held at rfa site for 20 min and then michelle hankins, from vir held pressure another 5 then PAD applied with 83ml air.  Site stable at present. Will reassess extended bedrest time by another 32min.

## 2018-07-26 NOTE — Discharge Instructions (Signed)

## 2018-07-26 NOTE — Op Note (Signed)
[facility] Percutaneous Study/Intervention Procedural Note   Date of Surgery: 07/26/2018  Surgeon:  Katha Cabal, MD.  Pre-operative Diagnosis: Atherosclerotic occlusive disease bilateral lower extremities with lifestyle limitation and rest pain symptoms  Post-operative diagnosis: Same  Procedure(s) Performed: 1. Introduction catheter into left lower extremity 3rd order catheter placement  2. Contrast injection left lower extremity for distal runoff   3. Percutaneous transluminal angioplasty and stent placement right external iliac artery 4. Star close closure right common femoral arteriotomy  Anesthesia: Conscious sedation was administered under my direct supervision by the interventional radiology RN. IV Versed plus fentanyl were utilized. Continuous ECG, pulse oximetry and blood pressure was monitored throughout the entire procedure.  Conscious sedation was for a total of 1 hour 20 minutes.  Sheath: 6 [place] Ansell right common femoral retrograde  Contrast: 100 cc  Fluoroscopy Time: 14.9 minutes  Indications: [name] presents with worsening leg pain.  The pain is become so severe that she is unable to walk around or carry out her daily activities.  She admits to the pain being present almost all the time.  Noninvasive studies show a significant decrease in her arterial perfusion with a drop in her left ABI to 0.6.  The risks and benefits for angiography with intervention are reviewed all questions answered patient agrees to proceed.  Procedure: Jill Hudson is a 83 y.o. y.o. female who was identified and appropriate procedural time out was performed. The patient was then placed supine on the table and prepped and draped in the usual sterile fashion.   Ultrasound was placed in the sterile sleeve and the right groin was evaluated the right common femoral artery was echolucent and pulsatile  indicating patency.  Image was recorded for the permanent record and under real-time visualization a microneedle was inserted into the common femoral artery microwire followed by a micro-sheath.  A J-wire was then advanced through the micro-sheath and a  5 [place] sheath was then inserted over a J-wire. J-wire was then advanced and a 5 French pigtail catheter was positioned at the level of T12. AP projection of the aorta was then obtained. Pigtail catheter was repositioned to above the bifurcation and a RAO view and then an LAO view of the pelvis was obtained.  Subsequently a rim catheter with the stiff angle Glidewire was used to cross the aortic bifurcation the catheter wire were advanced down into the left distal external iliac artery. Oblique view of the femoral bifurcation was then obtained and subsequently the wire was reintroduced and the pigtail catheter negotiated into the profunda femoris representing third order catheter placement. Distal runoff was then performed.  5000 units of heparin was then given and allowed to circulate and a 6 [place] Ansell sheath was advanced up and over the bifurcation and positioned in the femoral artery  A 14 is crosser was then prepped on the field and engaged into the cul-de-sac of the occluded SFA.  Multiple attempts at crossing the SFA occlusion were unsuccessful.  Subsequently attention was turned to the 80% stenosis in the proximal right external iliac artery.  Magnified imaging in both RAO and LAO was then obtained of the stenosis in the right external iliac artery.  After appropriate measurements a 5 x 26 lifestream stent is prepped on the field and then deployed across the stenosis.  Initial inflation was to use 8 atm for 30 seconds.  Subsequently, a 6 mm x 40 mm Lutonix drug-eluting balloon was advanced across the stent  and inflated to 6 atm for 30 seconds the balloon was then repositioned more proximally and inflated to 10 atm for 30 seconds.  Follow-up imaging  demonstrated less than 5% residual stenosis with wide patency of the stent in the external iliac artery.  After review of these images the sheath is pulled into the right external iliac oblique of the common femoral is obtained and a Star close device deployed. There no immediate complications.   Findings: The abdominal aorta is opacified with a bolus injection contrast. Renal arteries are single and appear patent although there is moderate disease on the left. The aorta itself has diffuse disease but no hemodynamically significant lesions. The common iliac arteries are widely patent bilaterally, the origins of the iliacs demonstrate disease but even in bilateral oblique views as well as magnified images it appears that the stenosis is less than or equal to 50% slightly more pronounced for the right common iliac than the left.  I did not feel that this rose to the threshold of requiring treatment.  The previously placed stent in the left common iliac is widely patent.  The right external iliac demonstrates a greater than 80% stenosis in its proximal portion the left external iliac appears patent.  The left common femoral is widely patent as is the profunda femoris.  The left SFA SFA occludes after proximally 1 cm leaving a small cul-de-sac previously placed stents in the left SFA are occluded there is reconstitution via profunda collaterals of the at knee popliteal.  The distal popliteal and trifurcation are patent.  Distally the anterior tibial is patent down to the foot filling the dorsalis pedis and the pedal arch.  The posterior tibial appears to occlude near the level of the ankle.  The peroneal appears patent but does not demonstrate significant collaterals at its distal terminus.  Following angioplasty and stent placement the right external iliac artery is now widely patent with less than 5% residual stenosis.   Summary: Successful recanalization of the right external iliac artery with  unsuccessful recanalization of the occluded left superficial femoral and proximal popliteal   Disposition: Patient was taken to the recovery room in stable condition having tolerated the procedure well.  Schnier, Dolores Lory 07/26/2018,9:18 AM

## 2018-07-26 NOTE — H&P (Signed)
[facility] History & Physical Update  The patient was interviewed and re-examined.  The patient's previous History and Physical has been reviewed and is unchanged.  There is no change in the plan of care. We plan to proceed with the scheduled procedure.  Hortencia Pilar, MD  07/26/2018, 7:43 AM

## 2018-07-26 NOTE — Progress Notes (Signed)
Dr [name] to patient bedside. 1 Stitch placed in rt groin access site r/t slow oozing. Pt was awake and was given lido prior to suture placement. Site dressed with folded gauze and tegaderm placed. Pt instructed to leave on uintil tomorrow,. Dressing dry and intact, site soft.

## 2018-07-27 ENCOUNTER — Encounter: Payer: Self-pay | Admitting: Vascular Surgery

## 2018-08-03 DIAGNOSIS — H353131 Nonexudative age-related macular degeneration, bilateral, early dry stage: Secondary | ICD-10-CM | POA: Diagnosis not present

## 2018-08-04 ENCOUNTER — Telehealth (INDEPENDENT_AMBULATORY_CARE_PROVIDER_SITE_OTHER): Payer: Self-pay

## 2018-08-04 NOTE — Telephone Encounter (Signed)
Patient called having right ankle swelling with soreness and bruising since her procedure on 07/26/2018.I spoke with Schnier and he advise for the patient to wear a mild compression stocking and was instructed put on in the morning and remove at night.

## 2018-08-09 ENCOUNTER — Other Ambulatory Visit (INDEPENDENT_AMBULATORY_CARE_PROVIDER_SITE_OTHER): Payer: Self-pay | Admitting: Vascular Surgery

## 2018-08-09 DIAGNOSIS — N186 End stage renal disease: Secondary | ICD-10-CM

## 2018-08-11 ENCOUNTER — Encounter: Payer: Self-pay | Admitting: Emergency Medicine

## 2018-08-11 ENCOUNTER — Inpatient Hospital Stay
Admission: EM | Admit: 2018-08-11 | Discharge: 2018-08-13 | DRG: 378 | Disposition: A | Discharge status: Home / Self Care | Payer: Medicare Other | Attending: Internal Medicine | Admitting: Internal Medicine

## 2018-08-11 ENCOUNTER — Encounter (INDEPENDENT_AMBULATORY_CARE_PROVIDER_SITE_OTHER): Payer: Medicare Other

## 2018-08-11 ENCOUNTER — Ambulatory Visit (INDEPENDENT_AMBULATORY_CARE_PROVIDER_SITE_OTHER): Payer: Medicare Other | Admitting: Vascular Surgery

## 2018-08-11 ENCOUNTER — Other Ambulatory Visit: Payer: Self-pay

## 2018-08-11 DIAGNOSIS — I252 Old myocardial infarction: Secondary | ICD-10-CM | POA: Diagnosis not present

## 2018-08-11 DIAGNOSIS — Z8673 Personal history of transient ischemic attack (TIA), and cerebral infarction without residual deficits: Secondary | ICD-10-CM | POA: Diagnosis not present

## 2018-08-11 DIAGNOSIS — Z7982 Long term (current) use of aspirin: Secondary | ICD-10-CM

## 2018-08-11 DIAGNOSIS — Z79899 Other long term (current) drug therapy: Secondary | ICD-10-CM | POA: Diagnosis not present

## 2018-08-11 DIAGNOSIS — K921 Melena: Secondary | ICD-10-CM | POA: Diagnosis not present

## 2018-08-11 DIAGNOSIS — Z87891 Personal history of nicotine dependence: Secondary | ICD-10-CM | POA: Diagnosis not present

## 2018-08-11 DIAGNOSIS — K5731 Diverticulosis of large intestine without perforation or abscess with bleeding: Secondary | ICD-10-CM | POA: Diagnosis present

## 2018-08-11 DIAGNOSIS — K625 Hemorrhage of anus and rectum: Secondary | ICD-10-CM

## 2018-08-11 DIAGNOSIS — I10 Essential (primary) hypertension: Secondary | ICD-10-CM | POA: Diagnosis not present

## 2018-08-11 DIAGNOSIS — Z794 Long term (current) use of insulin: Secondary | ICD-10-CM

## 2018-08-11 DIAGNOSIS — E785 Hyperlipidemia, unspecified: Secondary | ICD-10-CM | POA: Diagnosis present

## 2018-08-11 DIAGNOSIS — E1151 Type 2 diabetes mellitus with diabetic peripheral angiopathy without gangrene: Secondary | ICD-10-CM | POA: Diagnosis present

## 2018-08-11 DIAGNOSIS — Z7902 Long term (current) use of antithrombotics/antiplatelets: Secondary | ICD-10-CM | POA: Diagnosis not present

## 2018-08-11 DIAGNOSIS — N183 Chronic kidney disease, stage 3 (moderate): Secondary | ICD-10-CM | POA: Diagnosis not present

## 2018-08-11 DIAGNOSIS — Z833 Family history of diabetes mellitus: Secondary | ICD-10-CM

## 2018-08-11 DIAGNOSIS — K219 Gastro-esophageal reflux disease without esophagitis: Secondary | ICD-10-CM | POA: Diagnosis present

## 2018-08-11 DIAGNOSIS — K922 Gastrointestinal hemorrhage, unspecified: Secondary | ICD-10-CM | POA: Diagnosis present

## 2018-08-11 DIAGNOSIS — I639 Cerebral infarction, unspecified: Secondary | ICD-10-CM | POA: Diagnosis not present

## 2018-08-11 DIAGNOSIS — E119 Type 2 diabetes mellitus without complications: Secondary | ICD-10-CM | POA: Diagnosis not present

## 2018-08-11 DIAGNOSIS — E782 Mixed hyperlipidemia: Secondary | ICD-10-CM | POA: Diagnosis not present

## 2018-08-11 DIAGNOSIS — K640 First degree hemorrhoids: Secondary | ICD-10-CM | POA: Diagnosis present

## 2018-08-11 DIAGNOSIS — D62 Acute posthemorrhagic anemia: Secondary | ICD-10-CM | POA: Diagnosis present

## 2018-08-11 DIAGNOSIS — K279 Peptic ulcer, site unspecified, unspecified as acute or chronic, without hemorrhage or perforation: Secondary | ICD-10-CM | POA: Diagnosis present

## 2018-08-11 DIAGNOSIS — E1122 Type 2 diabetes mellitus with diabetic chronic kidney disease: Secondary | ICD-10-CM | POA: Diagnosis not present

## 2018-08-11 DIAGNOSIS — I2510 Atherosclerotic heart disease of native coronary artery without angina pectoris: Secondary | ICD-10-CM | POA: Diagnosis present

## 2018-08-11 DIAGNOSIS — I129 Hypertensive chronic kidney disease with stage 1 through stage 4 chronic kidney disease, or unspecified chronic kidney disease: Secondary | ICD-10-CM | POA: Diagnosis not present

## 2018-08-11 DIAGNOSIS — K5790 Diverticulosis of intestine, part unspecified, without perforation or abscess without bleeding: Secondary | ICD-10-CM | POA: Diagnosis not present

## 2018-08-11 LAB — COMPREHENSIVE METABOLIC PANEL
ALK PHOS: 75 U/L (ref 38–126)
ALT: 15 U/L (ref 0–44)
ANION GAP: 8 (ref 5–15)
AST: 19 U/L (ref 15–41)
Albumin: 3.6 g/dL (ref 3.5–5.0)
BILIRUBIN TOTAL: 0.6 mg/dL (ref 0.3–1.2)
BUN: 31 mg/dL — AB (ref 8–23)
CALCIUM: 9.2 mg/dL (ref 8.9–10.3)
CO2: 23 mmol/L (ref 22–32)
Chloride: 107 mmol/L (ref 98–111)
Creatinine, Ser: 1.25 mg/dL — ABNORMAL HIGH (ref 0.44–1.00)
GFR calc Af Amer: 45 mL/min — ABNORMAL LOW (ref >60)
GFR, EST NON AFRICAN AMERICAN: 39 mL/min — AB (ref >60)
GLUCOSE: 178 mg/dL — AB (ref 70–99)
POTASSIUM: 4.7 mmol/L (ref 3.5–5.1)
Sodium: 138 mmol/L (ref 135–145)
Total Protein: 6.4 g/dL — ABNORMAL LOW (ref 6.5–8.1)

## 2018-08-11 LAB — CBC
HCT: 27.7 % — ABNORMAL LOW (ref 36.0–46.0)
HEMOGLOBIN: 8.7 g/dL — AB (ref 12.0–15.0)
MCH: 28.8 pg (ref 26.0–34.0)
MCHC: 31.4 g/dL (ref 30.0–36.0)
MCV: 91.7 fL (ref 80.0–100.0)
Platelets: 269 10*3/uL (ref 150–400)
RBC: 3.02 MIL/uL — AB (ref 3.87–5.11)
RDW: 12.7 % (ref 11.5–15.5)
WBC: 8.5 10*3/uL (ref 4.0–10.5)
nRBC: 0 % (ref 0.0–0.2)

## 2018-08-11 LAB — HEMOGLOBIN AND HEMATOCRIT, BLOOD
HCT: 26.8 % — ABNORMAL LOW (ref 36.0–46.0)
HCT: 25.9 % — ABNORMAL LOW (ref 36.0–46.0)
HEMOGLOBIN: 8.5 g/dL — AB (ref 12.0–15.0)
HEMOGLOBIN: 8.3 g/dL — AB (ref 12.0–15.0)

## 2018-08-11 LAB — HEMOGLOBIN A1C
HEMOGLOBIN A1C: 7.8 % — AB (ref 4.8–5.6)
Mean Plasma Glucose: 177.16 mg/dL

## 2018-08-11 LAB — PROTIME-INR
INR: 1.08
PROTHROMBIN TIME: 13.9 s (ref 11.4–15.2)

## 2018-08-11 LAB — GLUCOSE, CAPILLARY
GLUCOSE-CAPILLARY: 293 mg/dL — AB (ref 70–99)
Glucose-Capillary: 143 mg/dL — ABNORMAL HIGH (ref 70–99)

## 2018-08-11 LAB — ABO/RH: ABO/RH(D): A POS

## 2018-08-11 MED ORDER — FAMOTIDINE IN NACL 20-0.9 MG/50ML-% IV SOLN
20.0000 mg | Freq: Two times a day (BID) | INTRAVENOUS | Status: DC
  Administered 2018-08-12 (×3): 20 mg via INTRAVENOUS
  Filled 2018-08-11 (×3): qty 50

## 2018-08-11 MED ORDER — ROSUVASTATIN CALCIUM 10 MG PO TABS
5.0000 mg | ORAL_TABLET | ORAL | Status: DC
  Administered 2018-08-12: 5 mg via ORAL
  Filled 2018-08-11: qty 1

## 2018-08-11 MED ORDER — CHLORTHALIDONE 25 MG PO TABS
25.0000 mg | ORAL_TABLET | Freq: Every day | ORAL | Status: DC
  Administered 2018-08-11 – 2018-08-13 (×3): 25 mg via ORAL
  Filled 2018-08-11 (×3): qty 1

## 2018-08-11 MED ORDER — POLYETHYLENE GLYCOL 3350 17 GM/SCOOP PO POWD
1.0000 | Freq: Once | ORAL | Status: AC
  Administered 2018-08-11: 255 g via ORAL
  Filled 2018-08-11: qty 255

## 2018-08-11 MED ORDER — DILTIAZEM HCL ER COATED BEADS 180 MG PO CP24
180.0000 mg | ORAL_CAPSULE | Freq: Two times a day (BID) | ORAL | Status: DC
  Administered 2018-08-11 – 2018-08-13 (×4): 180 mg via ORAL
  Filled 2018-08-11 (×5): qty 1

## 2018-08-11 MED ORDER — ACETAMINOPHEN 325 MG PO TABS: 650.0000 mg | ORAL_TABLET | Freq: Four times a day (QID) | ORAL | Status: DC | PRN

## 2018-08-11 MED ORDER — AMLODIPINE BESYLATE 5 MG PO TABS
2.5000 mg | ORAL_TABLET | Freq: Every day | ORAL | Status: DC
  Administered 2018-08-11 – 2018-08-13 (×3): 2.5 mg via ORAL
  Filled 2018-08-11 (×3): qty 1

## 2018-08-11 MED ORDER — OCUVITE-LUTEIN PO CAPS
1.0000 | ORAL_CAPSULE | Freq: Two times a day (BID) | ORAL | Status: DC
  Administered 2018-08-11 – 2018-08-13 (×3): 1 via ORAL
  Filled 2018-08-11 (×5): qty 1

## 2018-08-11 MED ORDER — LORATADINE 10 MG PO TABS: 10.0000 mg | ORAL_TABLET | Freq: Every day | ORAL | Status: DC | PRN

## 2018-08-11 MED ORDER — BACLOFEN 10 MG PO TABS
10.0000 mg | ORAL_TABLET | Freq: Every day | ORAL | Status: DC
  Administered 2018-08-11 – 2018-08-12 (×2): 10 mg via ORAL
  Filled 2018-08-11 (×3): qty 1

## 2018-08-11 MED ORDER — ACETAMINOPHEN ER 650 MG PO TBCR: 650.0000 mg | EXTENDED_RELEASE_TABLET | Freq: Three times a day (TID) | ORAL | Status: DC | PRN

## 2018-08-11 MED ORDER — HYDRALAZINE HCL 20 MG/ML IJ SOLN: 10.0000 mg | INTRAMUSCULAR | Status: DC | PRN

## 2018-08-11 MED ORDER — SODIUM CHLORIDE 0.9 % IV SOLN
250.0000 mL | INTRAVENOUS | Status: DC | PRN
  Administered 2018-08-12: 16:00:00 via INTRAVENOUS
  Administered 2018-08-12: 250 mL via INTRAVENOUS

## 2018-08-11 MED ORDER — INSULIN ASPART 100 UNIT/ML ~~LOC~~ SOLN
0.0000 [IU] | Freq: Every day | SUBCUTANEOUS | Status: DC
  Administered 2018-08-12: 4 [IU] via SUBCUTANEOUS
  Filled 2018-08-11 (×2): qty 1

## 2018-08-11 MED ORDER — LOSARTAN POTASSIUM 50 MG PO TABS
50.0000 mg | ORAL_TABLET | Freq: Every day | ORAL | Status: DC
  Administered 2018-08-11 – 2018-08-13 (×3): 50 mg via ORAL
  Filled 2018-08-11 (×3): qty 1

## 2018-08-11 MED ORDER — INSULIN ASPART 100 UNIT/ML ~~LOC~~ SOLN
0.0000 [IU] | Freq: Three times a day (TID) | SUBCUTANEOUS | Status: DC
  Administered 2018-08-12 – 2018-08-13 (×2): 3 [IU] via SUBCUTANEOUS
  Administered 2018-08-13: 5 [IU] via SUBCUTANEOUS
  Filled 2018-08-11 (×4): qty 1

## 2018-08-11 MED ORDER — ONDANSETRON HCL 4 MG/2ML IJ SOLN
4.0000 mg | Freq: Once | INTRAMUSCULAR | Status: DC
  Filled 2018-08-11: qty 2

## 2018-08-11 MED ORDER — SODIUM CHLORIDE 0.9% IV SOLUTION
Freq: Once | INTRAVENOUS | Status: DC
  Filled 2018-08-11: qty 250

## 2018-08-11 MED ORDER — SODIUM CHLORIDE 0.9% FLUSH: 3.0000 mL | INTRAVENOUS | Status: DC | PRN

## 2018-08-11 MED ORDER — VITAMIN B-12 1000 MCG PO TABS
1000.0000 ug | ORAL_TABLET | Freq: Every day | ORAL | Status: DC
  Administered 2018-08-11 – 2018-08-13 (×2): 1000 ug via ORAL
  Filled 2018-08-11 (×2): qty 1

## 2018-08-11 MED ORDER — SODIUM CHLORIDE 0.9% FLUSH
3.0000 mL | Freq: Two times a day (BID) | INTRAVENOUS | Status: DC
  Administered 2018-08-12 – 2018-08-13 (×4): 3 mL via INTRAVENOUS

## 2018-08-11 NOTE — ED Notes (Addendum)
Pt ambulatory to restroom at this time with steady gait noted , small BM noted with small amount of dark blood

## 2018-08-11 NOTE — ED Notes (Signed)
Pt's daughter's number Judson Roch) [phone]

## 2018-08-11 NOTE — ED Provider Notes (Addendum)
[facility] Emergency Department Provider Note  ____________________________________________   I have reviewed the triage vital signs and the nursing notes. Where available I have reviewed prior notes and, if possible and indicated, outside hospital notes.    HISTORY  Chief Complaint GI Bleeding    HPI Jill Hudson is a 83 y.o. female states that starting last night she had loose stools mixed with blood.  She looked in the toilet and it looks like she "gutted something" she has had 3 episodes of frankly bloody stool this last night.  No abdominal pain or fever.  Cannot remove her last colonoscopy.  States no history of diverticulitis.  She is on Plavix and aspirin for peripheral vascular disease.  She states that she has had black stools off and on for the last couple months she believes.  She also states that she has had no NSAIDs. No vomiting.   Past Medical History:  Diagnosis Date  . Anxiety   . Arthritis    spine and shoulder  . Cancer ([place])    skin  . Diabetes mellitus without complication ([place])   . Heart murmur   . Hyperlipidemia   . Hypertension   . Mitral and aortic valve disease   . Myocardial infarction [facility])    may have had a "light" heart attack  . Occasional tremors   . Shingles    patient unaware but daughter confirms. it was a long time ago  . Stroke [facility]) 01/2017   may have had a slight stroke  . TIA (transient ischemic attack) 01/2017  . UTI (urinary tract infection)   . Vascular disease, peripheral [facility])     Patient Active Problem List   Diagnosis Date Noted  . Leg pain 07/03/2017  . Carpal tunnel syndrome on both sides 05/05/2017  . Myalgia due to HMG CoA reductase inhibitor 05/05/2017  . CVA (cerebral vascular accident) ([place]) 02/15/2017  . Degenerative disc disease, lumbar 12/21/2016  . Atherosclerosis of native arteries of extremity with intermittent claudication ([place]) 12/20/2016  . Bilateral carotid artery stenosis  12/20/2016  . Hip bursitis 05/18/2016  . Elevated TSH 01/18/2016  . Chronic renal insufficiency, stage III (moderate) ([place]) 01/16/2016  . Senile ecchymosis 01/16/2016  . Uncontrolled type 2 diabetes mellitus with diabetic polyneuropathy, with long-term current use of insulin ([place]) 09/16/2015  . GERD (gastroesophageal reflux disease) 07/24/2015  . PAD (peripheral artery disease) ([place]) 05/17/2015  . Neoplasm of uncertain behavior of skin 05/17/2015  . Retinopathy, diabetic, proliferative ([place]) 04/11/2015  . Mixed hyperlipidemia 04/11/2015  . Essential (primary) hypertension 04/11/2015  . Generalized OA 04/11/2015  . Arteriosclerosis of coronary artery 05/29/2013  . Hypertensive heart disease without CHF 05/29/2013    Past Surgical History:  Procedure Laterality Date  . CARDIAC CATHETERIZATION  1998   40% LM, 95% Ramus interm  . CATARACT EXTRACTION, BILATERAL    . EYE SURGERY Bilateral    cataract extractions  . LOWER EXTREMITY ANGIOGRAPHY Left 08/23/2017   Procedure: Lower Extremity Angiography;  Surgeon: Algernon Huxley, MD;  Location: [facility];  Service: Cardiovascular;  Laterality: Left;  . LOWER EXTREMITY ANGIOGRAPHY Left 07/26/2018   Procedure: LOWER EXTREMITY ANGIOGRAPHY;  Surgeon: Katha Cabal, MD;  Location: [place] [facility];  Service: Cardiovascular;  Laterality: Left;  . PTCA  08/2013   Left common iliac  . PTCA  12/2012   left ext iliac  . TUBAL LIGATION      Prior to Admission medications   Medication  Sig Start Date End Date Taking? Authorizing Provider  acetaminophen (TYLENOL) 650 MG CR tablet Take 1,300 mg by mouth every 8 (eight) hours as needed for pain.    [provider]  amLODipine (NORVASC) 2.5 MG tablet Take 2.5 mg by mouth daily. 05/20/18   [provider]  aspirin 81 MG tablet Take 81 mg by mouth daily.     [provider]  B Complex Vitamins (VITAMIN-B COMPLEX) TABS Take 1 tablet by mouth daily.     [provider]  baclofen (LIORESAL) 10 MG tablet TAKE 1 TABLET(10 MG) BY MOUTH THREE TIMES DAILY Patient taking differently: Take 10 mg by mouth at bedtime.  02/09/18   [name], MD  Biotin 10 MG TABS Take 10 mg by mouth daily.     [provider]  chlorthalidone (HYGROTON) 25 MG tablet Take 25 mg by mouth daily.    [provider]  Cholecalciferol (VITAMIN D3 PO) Take 1 capsule by mouth 2 (two) times daily.    [provider]  clopidogrel (PLAVIX) 75 MG tablet Take 1 tablet (75 mg total) by mouth daily. 07/27/18   Schnier, Dolores Lory, MD  Cyanocobalamin (RA VITAMIN B-12 TR) 1000 MCG TBCR Take 1,000 mcg by mouth daily.     [provider]  diltiazem (CARTIA XT) 180 MG 24 hr capsule Take 180 mg by mouth 2 (two) times daily.     [provider]  insulin aspart (NOVOLOG FLEXPEN) 100 UNIT/ML FlexPen Inject 4 Units into the skin daily.  03/26/17   [provider]  Insulin Glargine (BASAGLAR KWIKPEN) 100 UNIT/ML SOPN Inject 12 Units into the skin daily.    [provider]  Insulin Pen Needle (B-D ULTRAFINE III SHORT PEN) 31G X 8 MM MISC USE AS DIRECTED( APPLY BY DOSE NOT DAILY) Patient not taking: Reported on 07/18/2018 12/09/17   [name], MD  loratadine (CLARITIN) 10 MG tablet Take 10 mg by mouth daily as needed for allergies.     [provider]  losartan (COZAAR) 50 MG tablet Take 50 mg by mouth daily.  06/20/18   [provider]  metFORMIN (GLUCOPHAGE-XR) 500 MG 24 hr tablet TAKE ONE TABLET BY MOUTH TWICE DAILY Patient taking differently: Take 500 mg by mouth 2 (two) times daily.  01/26/18   [name], MD  Multiple Vitamins-Minerals (MULTIVITAMIN PO) Take 1 tablet by mouth daily.    [provider]  Multiple Vitamins-Minerals (PRESERVISION AREDS PO) Take 1 capsule by mouth 2 (two) times daily.     [provider]  rosuvastatin (CRESTOR) 5 MG tablet Take 5 mg by mouth See admin  instructions. Take 5 mg by mouth daily on Monday, Wednesday, Friday and Saturday. 03/02/18   [provider]    Allergies Saxagliptin; Epinephrine; Atorvastatin; Codeine; Ezetimibe; Limonene; Nitrofurantoin; and Sulfa antibiotics  Family History  Problem Relation Age of Onset  . Dementia Mother   . Diabetes Father     Social History Social History   Tobacco Use  . Smoking status: Former Smoker    Packs/day: 2.00    Years: 37.00    Pack years: 74.00    Types: Cigarettes    Last attempt to quit: 1980    Years since quitting: 39.8  . Smokeless tobacco: Never Used  . Tobacco comment: smoking cessation materials not required  Substance Use Topics  . Alcohol use: No    Alcohol/week: 0.0 standard drinks  . Drug use: No  Review of Systems Constitutional: No fever/chills Eyes: No visual changes. ENT: No sore throat. No stiff neck no neck pain Cardiovascular: Denies chest pain. Respiratory: Denies shortness of breath. Gastrointestinal:   no vomiting.  + diarrhea.  No constipation. Genitourinary: Negative for dysuria. Musculoskeletal: Negative lower extremity swelling Skin: Negative for rash. Neurological: Negative for severe headaches, focal weakness or numbness.   ____________________________________________   PHYSICAL EXAM:  VITAL SIGNS: ED Triage Vitals  Enc Vitals Group     BP 08/11/18 1017 (!) 143/45     Pulse Rate 08/11/18 1017 67     Resp 08/11/18 1017 18     Temp 08/11/18 1017 98 F (36.7 C)     Temp Source 08/11/18 1017 Oral     SpO2 08/11/18 1013 98 %     Weight 08/11/18 1017 122 lb (55.3 kg)     Height 08/11/18 1017 5' 1.5" (1.562 m)     Head Circumference --      Peak Flow --      Pain Score 08/11/18 1017 5     Pain Loc --      Pain Edu? --      Excl. in [place]? --     Constitutional: Alert and oriented. Well appearing and in no acute distress. Eyes: Conjunctivae are normal Head: Atraumatic HEENT: No congestion/rhinnorhea. Mucous  membranes are moist.  Oropharynx non-erythematous Neck:   Nontender with no meningismus, no masses, no stridor Cardiovascular: Normal rate, regular rhythm. Grossly normal heart sounds.  Good peripheral circulation. Respiratory: Normal respiratory effort.  No retractions. Lungs CTAB. Abdominal: Soft and nontender. No distention. No guarding no rebound Back:  There is no focal tenderness or step off.  there is no midline tenderness there are no lesions noted. there is no CVA tenderness Frank dried blood noticed all around the rectum no obvious lesions noted. Musculoskeletal: No lower extremity tenderness, no upper extremity tenderness. No joint effusions, no DVT signs strong distal pulses no edema Neurologic:  Normal speech and language. No gross focal neurologic deficits are appreciated.  Skin:  Skin is warm, dry and intact. No rash noted. Psychiatric: Mood and affect are normal. Speech and behavior are normal.  ____________________________________________   LABS (all labs ordered are listed, but only abnormal results are displayed)  Labs Reviewed  COMPREHENSIVE METABOLIC PANEL - Abnormal; Notable for the following components:      Result Value   Glucose, Bld 178 (*)    BUN 31 (*)    Creatinine, Ser 1.25 (*)    Total Protein 6.4 (*)    GFR calc non Af Amer 39 (*)    GFR calc Af Amer 45 (*)    All other components within normal limits  CBC - Abnormal; Notable for the following components:   RBC 3.02 (*)    Hemoglobin 8.7 (*)    HCT 27.7 (*)    All other components within normal limits  POC OCCULT BLOOD, ED  TYPE AND SCREEN    Pertinent labs  results that were available during my care of the patient were reviewed by me and considered in my medical decision making (see chart for details). ____________________________________________  EKG  I personally interpreted any EKGs ordered by me or triage  ____________________________________________  RADIOLOGY  Pertinent labs &  imaging results that were available during my care of the patient were reviewed by me and considered in my medical decision making (see chart for details). If possible, patient and/or family made aware of any abnormal findings.  No results found. ____________________________________________    PROCEDURES  Procedure(s) performed: None  Procedures  Critical Care performed: CRITICAL CARE Performed by: Schuyler Amor   Total critical care time: 38 minutes  Critical care time was exclusive of separately billable procedures and treating other patients.  Critical care was necessary to treat or prevent imminent or life-threatening deterioration.  Critical care was time spent personally by me on the following activities: development of treatment plan with patient and/or surrogate as well as nursing, discussions with consultants, evaluation of patient's response to treatment, examination of patient, obtaining history from patient or surrogate, ordering and performing treatments and interventions, ordering and review of laboratory studies, ordering and review of radiographic studies, pulse oximetry and re-evaluation of patient's condition.   ____________________________________________   INITIAL IMPRESSION / ASSESSMENT AND PLAN / ED COURSE  Pertinent labs & imaging results that were available during my care of the patient were reviewed by me and considered in my medical decision making (see chart for details).  Patient here with rectal bleeding, hemoglobin is lower than prior.  Vital signs are okay.  She did not take her blood pressure medications today so somewhat difficult to interpret where she really is.  Her rectal exam is remarkable for a blood she is on Plavix and aspirin but not actively bleeding at this moment.  This is a non-painful bleeding episode, low suspicion for diverticulitis, diverticulosis is certainly possible at this time it appears to be a lower GI bleed although she has  also had black stool she states over the last couple months none recently.  This could be an invasive infectious pathology as well, but either way we will see if we can get her admitted.  There is no obvious indication that she is infected.  Type and screen is already been sent.  Coags are pending    ____________________________________________   FINAL CLINICAL IMPRESSION(S) / ED DIAGNOSES  Final diagnoses:  Rectal bleeding      This chart was dictated using voice recognition software.  Despite best efforts to proofread,  errors can occur which can change meaning.      Schuyler Amor, MD 08/11/18 1209    Schuyler Amor, MD 08/11/18 1209

## 2018-08-11 NOTE — ED Notes (Signed)
Pt transported to room 230.

## 2018-08-11 NOTE — ED Triage Notes (Signed)
Pt comes into the ED via EMS from home with c/o having scant amount of rectal bleeding last night and this morning states she passed a large amount of bright red blood with her stool. Pt is in NAD on arrival

## 2018-08-11 NOTE — H&P (Signed)
[facility] [place] NAME: Jill Hudson    MR#:  [id number]  DATE OF BIRTH:  Jan 08, 1935  DATE OF ADMISSION:  08/11/2018  PRIMARY CARE PHYSICIAN: [name], MD   REQUESTING/REFERRING PHYSICIAN:   CHIEF COMPLAINT:   Chief Complaint  Patient presents with  . GI Bleeding    HISTORY OF PRESENT ILLNESS: Jill Hudson  is a 83 y.o. female with a known history per below which also includes hemorrhoids, presenting with bright red blood per rectum since yesterday, 3 episodes mixed with stool, in the emergency room patient was found to have hemoglobin 8.7, blood pressure 183/52, patient evaluated emergency room, no apparent distress, resting comfortably in bed, denies any pain, patient stated last colonoscopy was normal, patient now be admitted for acute bright red blood per rectum most likely secondary to diverticular disease.  PAST MEDICAL HISTORY:   Past Medical History:  Diagnosis Date  . Anxiety   . Arthritis    spine and shoulder  . Cancer ([place])    skin  . Diabetes mellitus without complication ([place])   . Heart murmur   . Hyperlipidemia   . Hypertension   . Mitral and aortic valve disease   . Myocardial infarction [facility])    may have had a "light" heart attack  . Occasional tremors   . Shingles    patient unaware but daughter confirms. it was a long time ago  . Stroke [facility]) 01/2017   may have had a slight stroke  . TIA (transient ischemic attack) 01/2017  . UTI (urinary tract infection)   . Vascular disease, peripheral ([place])     PAST SURGICAL HISTORY:  Past Surgical History:  Procedure Laterality Date  . CARDIAC CATHETERIZATION  1998   40% LM, 95% Ramus interm  . CATARACT EXTRACTION, BILATERAL    . EYE SURGERY Bilateral    cataract extractions  . LOWER EXTREMITY ANGIOGRAPHY Left 08/23/2017   Procedure: Lower Extremity Angiography;  Surgeon: Algernon Huxley, MD;  Location: [facility];  Service: Cardiovascular;   Laterality: Left;  . LOWER EXTREMITY ANGIOGRAPHY Left 07/26/2018   Procedure: LOWER EXTREMITY ANGIOGRAPHY;  Surgeon: Katha Cabal, MD;  Location: [facility];  Service: Cardiovascular;  Laterality: Left;  . PTCA  08/2013   Left common iliac  . PTCA  12/2012   left ext iliac  . TUBAL LIGATION      SOCIAL HISTORY:  Social History   Tobacco Use  . Smoking status: Former Smoker    Packs/day: 2.00    Years: 37.00    Pack years: 74.00    Types: Cigarettes    Last attempt to quit: 1980    Years since quitting: 39.8  . Smokeless tobacco: Never Used  . Tobacco comment: smoking cessation materials not required  Substance Use Topics  . Alcohol use: No    Alcohol/week: 0.0 standard drinks    FAMILY HISTORY:  Family History  Problem Relation Age of Onset  . Dementia Mother   . Diabetes Father     DRUG ALLERGIES:  Allergies  Allergen Reactions  . Saxagliptin Diarrhea  . Epinephrine Other (See Comments)    Patient does not remember what happens when she uses this  . Atorvastatin Other (See Comments)    Muscle aches  . Codeine Other (See Comments)    Upset stomach  . Ezetimibe Other (See Comments)    Myalgias(ZETIA)  . Limonene Rash    Patient does not  recall this reaction  . Nitrofurantoin Rash and Other (See Comments)    Pruitus  . Sulfa Antibiotics Rash and Other (See Comments)    Sore mouth     REVIEW OF SYSTEMS:   CONSTITUTIONAL: No fever, fatigue or weakness.  EYES: No blurred or double vision.  EARS, NOSE, AND THROAT: No tinnitus or ear pain.  RESPIRATORY: No cough, shortness of breath, wheezing or hemoptysis.  CARDIOVASCULAR: No chest pain, orthopnea, edema.  GASTROINTESTINAL: No nausea, vomiting, diarrhea or abdominal pain. +gib GENITOURINARY: No dysuria, hematuria.  ENDOCRINE: No polyuria, nocturia,  HEMATOLOGY: No anemia, easy bruising or bleeding SKIN: No rash or lesion. MUSCULOSKELETAL: No joint pain or arthritis.   NEUROLOGIC: No  tingling, numbness, weakness.  PSYCHIATRY: No anxiety or depression.   MEDICATIONS AT HOME:  Prior to Admission medications   Medication Sig Start Date End Date Taking? Authorizing Provider  acetaminophen (TYLENOL) 650 MG CR tablet Take 1,300 mg by mouth every 8 (eight) hours as needed for pain.   Yes [provider]  amLODipine (NORVASC) 2.5 MG tablet Take 2.5 mg by mouth daily. 05/20/18  Yes [provider]  aspirin 81 MG tablet Take 81 mg by mouth daily.    Yes [provider]  B Complex Vitamins (VITAMIN-B COMPLEX) TABS Take 1 tablet by mouth daily.    Yes [provider]  baclofen (LIORESAL) 10 MG tablet TAKE 1 TABLET(10 MG) BY MOUTH THREE TIMES DAILY Patient taking differently: Take 10 mg by mouth at bedtime.  02/09/18  Yes [name], MD  Biotin 10 MG TABS Take 10 mg by mouth daily.    Yes [provider]  chlorthalidone (HYGROTON) 25 MG tablet Take 25 mg by mouth daily.   Yes [provider]  Cholecalciferol (VITAMIN D3 PO) Take 1 capsule by mouth 2 (two) times daily.   Yes [provider]  clopidogrel (PLAVIX) 75 MG tablet Take 1 tablet (75 mg total) by mouth daily. 07/27/18  Yes Schnier, Dolores Lory, MD  Cyanocobalamin (RA VITAMIN B-12 TR) 1000 MCG TBCR Take 1,000 mcg by mouth daily.    Yes [provider]  diltiazem (CARTIA XT) 180 MG 24 hr capsule Take 180 mg by mouth 2 (two) times daily.    Yes [provider]  insulin aspart (NOVOLOG FLEXPEN) 100 UNIT/ML FlexPen Inject 4 Units into the skin daily.  03/26/17  Yes [provider]  Insulin Glargine (BASAGLAR KWIKPEN) 100 UNIT/ML SOPN Inject 12 Units into the skin daily.   Yes [provider]  loratadine (CLARITIN) 10 MG tablet Take 10 mg by mouth daily as needed for allergies.    Yes [provider]  losartan (COZAAR) 50 MG tablet Take 50 mg by mouth daily.  06/20/18  Yes [provider]  metFORMIN (GLUCOPHAGE-XR) 500  MG 24 hr tablet TAKE ONE TABLET BY MOUTH TWICE DAILY Patient taking differently: Take 500 mg by mouth 2 (two) times daily.  01/26/18  Yes [name], MD  Multiple Vitamins-Minerals (MULTIVITAMIN PO) Take 1 tablet by mouth daily.   Yes [provider]  Multiple Vitamins-Minerals (PRESERVISION AREDS PO) Take 1 capsule by mouth 2 (two) times daily.    Yes [provider]  rosuvastatin (CRESTOR) 5 MG tablet Take 5 mg by mouth See admin instructions. Take 5 mg by mouth daily on Monday, Wednesday, Friday and Saturday. 03/02/18  Yes [provider]      PHYSICAL EXAMINATION:   VITAL SIGNS: Blood pressure (!) 183/52, pulse 82,  temperature 98 F (36.7 C), temperature source Oral, resp. rate 18, height 5' 1.5" (1.562 m), weight 55.3 kg, SpO2 98 %.  GENERAL:  83 y.o.-year-old patient lying in the bed with no acute distress.  EYES: Pupils equal, round, reactive to light and accommodation. No scleral icterus. Extraocular muscles intact.  HEENT: Head atraumatic, normocephalic. Oropharynx and nasopharynx clear.  NECK:  Supple, no jugular venous distention. No thyroid enlargement, no tenderness.  LUNGS: Normal breath sounds bilaterally, no wheezing, rales,rhonchi or crepitation. No use of accessory muscles of respiration.  CARDIOVASCULAR: S1, S2 normal. No murmurs, rubs, or gallops.  ABDOMEN: Soft, nontender, nondistended. Bowel sounds present. No organomegaly or mass.  EXTREMITIES: No pedal edema, cyanosis, or clubbing.  NEUROLOGIC: Cranial nerves II through XII are intact. Muscle strength 5/5 in all extremities. Sensation intact. Gait not checked.  PSYCHIATRIC: The patient is alert and oriented x 3.  SKIN: No obvious rash, lesion, or ulcer.   LABORATORY PANEL:   CBC Recent Labs  Lab 08/11/18 1023  WBC 8.5  HGB 8.7*  HCT 27.7*  PLT 269  MCV 91.7  MCH 28.8  MCHC 31.4  RDW 12.7    ------------------------------------------------------------------------------------------------------------------  Chemistries  Recent Labs  Lab 08/11/18 1023  NA 138  K 4.7  CL 107  CO2 23  GLUCOSE 178*  BUN 31*  CREATININE 1.25*  CALCIUM 9.2  AST 19  ALT 15  ALKPHOS 75  BILITOT 0.6   ------------------------------------------------------------------------------------------------------------------ estimated creatinine clearance is 27.3 mL/min (A) (by C-G formula based on SCr of 1.25 mg/dL (H)). ------------------------------------------------------------------------------------------------------------------ No results for input(s): TSH, T4TOTAL, T3FREE, THYROIDAB in the last 72 hours.  Invalid input(s): FREET3   Coagulation profile No results for input(s): INR, PROTIME in the last 168 hours. ------------------------------------------------------------------------------------------------------------------- No results for input(s): DDIMER in the last 72 hours. -------------------------------------------------------------------------------------------------------------------  Cardiac Enzymes No results for input(s): CKMB, TROPONINI, MYOGLOBIN in the last 168 hours.  Invalid input(s): CK ------------------------------------------------------------------------------------------------------------------ Invalid input(s): POCBNP  ---------------------------------------------------------------------------------------------------------------  Urinalysis    Component Value Date/Time   COLORURINE STRAW (A) 02/15/2017 1558   APPEARANCEUR CLEAR (A) 02/15/2017 1558   LABSPEC 1.006 02/15/2017 1558   PHURINE 7.0 02/15/2017 1558   GLUCOSEU NEGATIVE 02/15/2017 1558   HGBUR NEGATIVE 02/15/2017 1558   BILIRUBINUR neg 03/07/2018 1506   KETONESUR NEGATIVE 02/15/2017 1558   PROTEINUR 30 03/07/2018 1506   PROTEINUR 30 (A) 02/15/2017 1558   UROBILINOGEN 0.2 03/07/2018 1506    NITRITE neg 03/07/2018 1506   NITRITE NEGATIVE 02/15/2017 1558   LEUKOCYTESUR Moderate (2+) (A) 03/07/2018 1506     RADIOLOGY: No results found.  EKG: Orders placed or performed during the hospital encounter of 04/09/17  . ED EKG  . ED EKG  . ED EKG  . ED EKG    IMPRESSION AND PLAN: *Acute BRBPR Noted history of hemorrhoids, most likely due to diverticular bleeding Admit to regular nursing for bed on our GI bleeding protocol, hold aspirin/Plavix, strict I&O monitoring, IV Protonix, gastroenterology to see, clear liquid the diet for now, n.p.o. after midnight H&H every 6 hours, CBC daily, type and screen, transfuse as needed  *Acute blood loss anemia Secondary to above Check anemia work-up for completeness and treat as indicated  *Chronic diabetes mellitus type 2, controlled Check hemoglobin A1c determine level control, hold metformin while in house, sliding scale insulin with Accu-Cheks per routine  *Acute accelerated hypertension Continue home regiment, hydralazine as needed, vitals per routine, make changes as per necessary  *History of CVA Aspirin/Plavix on hold, continue statin therapy  All the records are reviewed and case discussed with ED provider. Management plans discussed with the patient, family and they are in agreement.  CODE STATUS:full Code Status History    Date Active Date Inactive Code Status Order ID Comments User Context   07/26/2018 0921 07/26/2018 1652 Full Code [id number]  Katha Cabal, MD Inpatient   08/23/2017 0935 08/23/2017 1419 Full Code [id number]  Algernon Huxley, MD Inpatient   02/15/2017 1538 02/16/2017 1959 Full Code [id number]  Dustin Flock, MD Inpatient    Advance Directive Documentation     Most Recent Value  Type of Advance Directive  Healthcare Power of Attorney  Pre-existing out of facility DNR order (yellow form or pink MOST form)  -  "MOST" Form in Place?  -       TOTAL TIME TAKING CARE OF THIS PATIENT: 45 minutes.     Avel Peace Jesly Hartmann M.D on 08/11/2018   Between 7am to 6pm - Pager - [phone]  After 6pm go to [email or web address] - password [email or web address] [facility]  Office  [phone]  CC: Primary care physician; [name], MD   Note: This dictation was prepared with Dragon dictation along with smaller phrase technology. Any transcriptional errors that result from this process are unintentional.

## 2018-08-12 ENCOUNTER — Inpatient Hospital Stay: Payer: Medicare Other | Admitting: Certified Registered Nurse Anesthetist

## 2018-08-12 ENCOUNTER — Encounter
Admission: EM | Disposition: A | Discharge status: Home / Self Care | Payer: Self-pay | Source: Home / Self Care | Attending: Internal Medicine

## 2018-08-12 ENCOUNTER — Encounter: Payer: Self-pay | Admitting: Emergency Medicine

## 2018-08-12 DIAGNOSIS — K625 Hemorrhage of anus and rectum: Secondary | ICD-10-CM

## 2018-08-12 HISTORY — PX: COLONOSCOPY WITH PROPOFOL: SHX5780

## 2018-08-12 LAB — CBC
HEMATOCRIT: 22.9 % — AB (ref 36.0–46.0)
HEMOGLOBIN: 7.1 g/dL — AB (ref 12.0–15.0)
MCH: 28.3 pg (ref 26.0–34.0)
MCHC: 31 g/dL (ref 30.0–36.0)
MCV: 91.2 fL (ref 80.0–100.0)
NRBC: 0 % (ref 0.0–0.2)
Platelets: 249 10*3/uL (ref 150–400)
RBC: 2.51 MIL/uL — AB (ref 3.87–5.11)
RDW: 12.8 % (ref 11.5–15.5)
WBC: 7.7 10*3/uL (ref 4.0–10.5)

## 2018-08-12 LAB — GLUCOSE, CAPILLARY
GLUCOSE-CAPILLARY: 169 mg/dL — AB (ref 70–99)
Glucose-Capillary: 193 mg/dL — ABNORMAL HIGH (ref 70–99)
Glucose-Capillary: 196 mg/dL — ABNORMAL HIGH (ref 70–99)
Glucose-Capillary: 319 mg/dL — ABNORMAL HIGH (ref 70–99)

## 2018-08-12 LAB — HEMOGLOBIN AND HEMATOCRIT, BLOOD
HEMATOCRIT: 23.5 % — AB (ref 36.0–46.0)
Hemoglobin: 7.4 g/dL — ABNORMAL LOW (ref 12.0–15.0)

## 2018-08-12 SURGERY — COLONOSCOPY WITH PROPOFOL
Anesthesia: General

## 2018-08-12 MED ORDER — PROPOFOL 10 MG/ML IV BOLUS
INTRAVENOUS | Status: DC | PRN
  Administered 2018-08-12: 50 mg via INTRAVENOUS

## 2018-08-12 MED ORDER — LIDOCAINE 2% (20 MG/ML) 5 ML SYRINGE
INTRAMUSCULAR | Status: DC | PRN
  Administered 2018-08-12: 50 mg via INTRAVENOUS

## 2018-08-12 MED ORDER — PROPOFOL 500 MG/50ML IV EMUL
INTRAVENOUS | Status: DC | PRN
  Administered 2018-08-12: 150 ug/kg/min via INTRAVENOUS

## 2018-08-12 MED ORDER — INSULIN DETEMIR 100 UNIT/ML ~~LOC~~ SOLN
6.0000 [IU] | Freq: Every day | SUBCUTANEOUS | Status: DC
  Administered 2018-08-12 – 2018-08-13 (×2): 6 [IU] via SUBCUTANEOUS
  Filled 2018-08-12 (×3): qty 0.06

## 2018-08-12 NOTE — Transfer of Care (Signed)
Immediate Anesthesia Transfer of Care Note  Patient: Jill Hudson  Procedure(s) Performed: COLONOSCOPY WITH PROPOFOL (N/A )  Patient Location: Endoscopy Unit  Anesthesia Type:General  Level of Consciousness: sedated  Airway & Oxygen Therapy: Patient connected to nasal cannula oxygen  Post-op Assessment: Post -op Vital signs reviewed and stable  Post vital signs: stable  Last Vitals:  Vitals Value Taken Time  BP    Temp    Pulse    Resp    SpO2      Last Pain:  Vitals:   08/12/18 1352  TempSrc: Oral  PainSc:          Complications: No apparent anesthesia complications

## 2018-08-12 NOTE — Progress Notes (Signed)
[facility] [place] NAME: Jill Hudson    MR#:  [id number]  DATE OF BIRTH:  11/17/1934  SUBJECTIVE:  CHIEF COMPLAINT:   Chief Complaint  Patient presents with  . GI Bleeding   Continues to have blood in stool although less Afebrile  REVIEW OF SYSTEMS:    Review of Systems  Constitutional: Positive for malaise/fatigue. Negative for chills and fever.  HENT: Negative for sore throat.   Eyes: Negative for blurred vision, double vision and pain.  Respiratory: Negative for cough, hemoptysis, shortness of breath and wheezing.   Cardiovascular: Negative for chest pain, palpitations, orthopnea and leg swelling.  Gastrointestinal: Positive for blood in stool. Negative for abdominal pain, constipation, diarrhea, heartburn, nausea and vomiting.  Genitourinary: Negative for dysuria and hematuria.  Musculoskeletal: Negative for back pain and joint pain.  Skin: Negative for rash.  Neurological: Negative for sensory change, speech change, focal weakness and headaches.  Endo/Heme/Allergies: Does not bruise/bleed easily.  Psychiatric/Behavioral: Negative for depression. The patient is not nervous/anxious.     DRUG ALLERGIES:   Allergies  Allergen Reactions  . Saxagliptin Diarrhea  . Epinephrine Other (See Comments)    Patient does not remember what happens when she uses this  . Atorvastatin Other (See Comments)    Muscle aches  . Codeine Other (See Comments)    Upset stomach  . Ezetimibe Other (See Comments)    Myalgias(ZETIA)  . Limonene Rash    Patient does not recall this reaction  . Nitrofurantoin Rash and Other (See Comments)    Pruitus  . Sulfa Antibiotics Rash and Other (See Comments)    Sore mouth     VITALS:  Blood pressure (!) 144/51, pulse 70, temperature 97.7 F (36.5 C), temperature source Oral, resp. rate 18, height 5' 1.5" (1.562 m), weight 55.3 kg, SpO2 98 %.  PHYSICAL EXAMINATION:   Physical Exam  GENERAL:  83  y.o.-year-old patient lying in the bed with no acute distress.  EYES: Pupils equal, round, reactive to light and accommodation. No scleral icterus. Extraocular muscles intact.  HEENT: Head atraumatic, normocephalic. Oropharynx and nasopharynx clear.  NECK:  Supple, no jugular venous distention. No thyroid enlargement, no tenderness.  LUNGS: Normal breath sounds bilaterally, no wheezing, rales, rhonchi. No use of accessory muscles of respiration.  CARDIOVASCULAR: S1, S2 normal. No murmurs, rubs, or gallops.  ABDOMEN: Soft, nontender, nondistended. Bowel sounds present. No organomegaly or mass.  EXTREMITIES: No cyanosis, clubbing or edema b/l.    NEUROLOGIC: Cranial nerves II through XII are intact. No focal Motor or sensory deficits b/l.   PSYCHIATRIC: The patient is alert and oriented x 3.  SKIN: No obvious rash, lesion, or ulcer.   LABORATORY PANEL:   CBC Recent Labs  Lab 08/12/18 0520  WBC 7.7  HGB 7.1*  HCT 22.9*  PLT 249   ------------------------------------------------------------------------------------------------------------------ Chemistries  Recent Labs  Lab 08/11/18 1023  NA 138  K 4.7  CL 107  CO2 23  GLUCOSE 178*  BUN 31*  CREATININE 1.25*  CALCIUM 9.2  AST 19  ALT 15  ALKPHOS 75  BILITOT 0.6   ------------------------------------------------------------------------------------------------------------------  Cardiac Enzymes No results for input(s): TROPONINI in the last 168 hours. ------------------------------------------------------------------------------------------------------------------  RADIOLOGY:  No results found.   ASSESSMENT AND PLAN:   *Acute BRBPR Most likely  diverticular bleed Waiting for colonoscopy later today Needs further inpatient stay due to on going bleeding and drop in hb  *Acute blood loss anemia Secondary to gi bleed  Transfuse if hb <7. Consent obtained  * Diabetes mellitus type 2, controlled Start levemir 6  units daily. SSI  * HTN continue home meds Hydralazine IV  *History of CVA Aspirin/Plavix on hold, continue statin therapy  All the records are reviewed and case discussed with Care Management/Social Worker Management plans discussed with the patient, family and they are in agreement.  CODE STATUS: FULL CODE  DVT Prophylaxis: SCDs  TOTAL TIME TAKING CARE OF THIS PATIENT: 81 minutes.   POSSIBLE D/C IN 1-2 DAYS, DEPENDING ON CLINICAL CONDITION.  Leia Alf Jill Hudson M.D on 08/12/2018 at 11:41 AM  Between 7am to 6pm - Pager - [phone]  After 6pm go to [email or web address] - password [email or web address] [facility]  [facility] [place] [facility]  Office  [phone]  CC: Primary care physician; [name], MD  Note: This dictation was prepared with Dragon dictation along with smaller phrase technology. Any transcriptional errors that result from this process are unintentional.

## 2018-08-12 NOTE — Anesthesia Post-op Follow-up Note (Signed)
Anesthesia QCDR form completed.        

## 2018-08-12 NOTE — Progress Notes (Signed)
Inpatient Diabetes Program Recommendations  AACE/ADA: New Consensus Statement on Inpatient Glycemic Control (2019)  Target Ranges:  Prepandial:   less than 140 mg/dL      Peak postprandial:   less than 180 mg/dL (1-2 hours)      Critically ill patients:  140 - 180 mg/dL   Results for Jill Hudson, Jill Hudson (MRN [id number]) as of 08/12/2018 10:07  Ref. Range 08/11/2018 15:58 08/11/2018 22:01 08/12/2018 07:36  Glucose-Capillary Latest Ref Range: 70 - 99 mg/dL 143 (H) 293 (H) 193 (H)   Review of Glycemic Control  Diabetes history: DM2 Outpatient Diabetes medications: Basaglar 12 units daily, Novolog 4 units daily, Metformin XR 500 mg BID Current orders for Inpatient glycemic control: Novolog 0-15 units TID with meals and Novolog 0-5 units QHS  Inpatient Diabetes Program Recommendations: Insulin - Basal: If glucose is consistently greater than 180 mg/dl with Novolog correction insulin, please consider ordering Lantus 8 units Q24H. HgbA1C: A1C 7.8% on 08/11/18 indicating an average glucose of 177 mg/dl.  Thanks, Barnie Alderman, RN, MSN, CDE Diabetes Coordinator Inpatient Diabetes Program [phone] (Team Pager from 8am to 5pm)

## 2018-08-12 NOTE — Op Note (Signed)
[facility] Gastroenterology Patient Name: Jill Hudson Procedure Date: 08/12/2018 3:36 PM MRN: [id number] Account #: [id number] Date of Birth: May 18, 1935 Admit Type: Inpatient Age: 83 Room: [facility] ENDO ROOM 4 Gender: Female Note Status: Finalized Procedure:            Colonoscopy Indications:          Hematochezia Providers:            Lucilla Lame MD, MD Referring MD:         [name], MD (Referring MD) Medicines:            Propofol per Anesthesia Complications:        No immediate complications. Procedure:            Pre-Anesthesia Assessment:                       - Prior to the procedure, a History and Physical was                        performed, and patient medications and allergies were                        reviewed. The patient's tolerance of previous                        anesthesia was also reviewed. The risks and benefits of                        the procedure and the sedation options and risks were                        discussed with the patient. All questions were                        answered, and informed consent was obtained. Prior                        Anticoagulants: The patient has taken no previous                        anticoagulant or antiplatelet agents. ASA Grade                        Assessment: II - A patient with mild systemic disease.                        After reviewing the risks and benefits, the patient was                        deemed in satisfactory condition to undergo the                        procedure.                       After obtaining informed consent, the colonoscope was                        passed under direct vision. Throughout the procedure,  the patient's blood pressure, pulse, and oxygen                        saturations were monitored continuously. The                        Colonoscope was introduced through the anus and                        advanced to the the cecum,  identified by appendiceal                        orifice and ileocecal valve. The colonoscopy was                        performed without difficulty. The patient tolerated the                        procedure well. The quality of the bowel preparation                        was excellent. Findings:      The perianal and digital rectal examinations were normal.      Multiple small-mouthed diverticula were found in the sigmoid colon,       descending colon, transverse colon and ascending colon.      Non-bleeding internal hemorrhoids were found during retroflexion. The       hemorrhoids were Grade I (internal hemorrhoids that do not prolapse).      Although there were diverticuli throughout the colon, the majority were       in the left side. Impression:           - Diverticulosis in the sigmoid colon, in the                        descending colon, in the transverse colon and in the                        ascending colon.                       - Non-bleeding internal hemorrhoids.                       - Although there were diverticuli throughout the colon,                        the majority were in the left side.                       - No specimens collected. Recommendation:       - Return patient to hospital ward for ongoing care.                       - Advance diet as tolerated. Procedure Code(s):    --- Professional ---                       [id number], Colonoscopy, flexible; diagnostic, including                        collection of specimen(s)  by brushing or washing, when                        performed (separate procedure) Diagnosis Code(s):    --- Professional ---                       K92.1, Melena (includes Hematochezia) CPT copyright 2018 American Medical Association. All rights reserved. The codes documented in this report are preliminary and upon coder review may  be revised to meet current compliance requirements. Lucilla Lame MD, MD 08/12/2018 4:05:51 PM This report has  been signed electronically. Number of Addenda: 0 Note Initiated On: 08/12/2018 3:36 PM Scope Withdrawal Time: 0 hours 7 minutes 28 seconds  Total Procedure Duration: 0 hours 14 minutes 12 seconds       [facility]

## 2018-08-12 NOTE — Anesthesia Preprocedure Evaluation (Signed)
Anesthesia Evaluation  Patient identified by MRN, date of birth, ID band Patient awake    Reviewed: Allergy & Precautions, H&P , NPO status , Patient's Chart, lab work & pertinent test results  History of Anesthesia Complications Negative for: history of anesthetic complications  Airway Mallampati: III  TM Distance: <3 FB Neck ROM: full    Dental  (+) Chipped, Partial Lower   Pulmonary neg shortness of breath, former smoker,           Cardiovascular Exercise Tolerance: Good hypertension, (-) angina+ CAD, + Past MI and + Peripheral Vascular Disease  (-) DOE      Neuro/Psych PSYCHIATRIC DISORDERS TIA Neuromuscular disease CVA negative psych ROS   GI/Hepatic Neg liver ROS, GERD  Medicated and Controlled,  Endo/Other  diabetes, Type 2  Renal/GU Renal disease  negative genitourinary   Musculoskeletal   Abdominal   Peds  Hematology negative hematology ROS (+)   Anesthesia Other Findings Patient is NPO appropriate and reports no nausea or vomiting today.   Past Medical History: No date: Anxiety No date: Arthritis     Comment:  spine and shoulder No date: Cancer ([place])     Comment:  skin No date: Diabetes mellitus without complication (HCC) No date: Heart murmur No date: Hyperlipidemia No date: Hypertension No date: Mitral and aortic valve disease No date: Myocardial infarction [facility])     Comment:  may have had a "light" heart attack No date: Occasional tremors No date: Shingles     Comment:  patient unaware but daughter confirms. it was a long               time ago 01/2017: Stroke [facility])     Comment:  may have had a slight stroke 01/2017: TIA (transient ischemic attack) No date: UTI (urinary tract infection) No date: Vascular disease, peripheral ([place])  Past Surgical History: 1998: CARDIAC CATHETERIZATION     Comment:  40% LM, 95% Ramus interm No date: CATARACT EXTRACTION, BILATERAL No date: EYE SURGERY;  Bilateral     Comment:  cataract extractions 08/23/2017: LOWER EXTREMITY ANGIOGRAPHY; Left     Comment:  Procedure: Lower Extremity Angiography;  Surgeon: Algernon Huxley, MD;  Location: [place] [facility];  Service:               Cardiovascular;  Laterality: Left; 07/26/2018: LOWER EXTREMITY ANGIOGRAPHY; Left     Comment:  Procedure: LOWER EXTREMITY ANGIOGRAPHY;  Surgeon:               Katha Cabal, MD;  Location: [place] [facility];               Service: Cardiovascular;  Laterality: Left; 08/2013: PTCA     Comment:  Left common iliac 12/2012: PTCA     Comment:  left ext iliac No date: TUBAL LIGATION  BMI    Body Mass Index:  22.68 kg/m      Reproductive/Obstetrics negative OB ROS                             Anesthesia Physical Anesthesia Plan  ASA: III  Anesthesia Plan: General   Post-op Pain Management:    Induction: Intravenous  PONV Risk Score and Plan: Propofol infusion and TIVA  Airway Management Planned: Natural Airway and Nasal Cannula  Additional Equipment:   Intra-op Plan:   Post-operative Plan:  Informed Consent: I have reviewed the patients History and Physical, chart, labs and discussed the procedure including the risks, benefits and alternatives for the proposed anesthesia with the patient or authorized representative who has indicated his/her understanding and acceptance.   Dental Advisory Given  Plan Discussed with: Anesthesiologist, CRNA and Surgeon  Anesthesia Plan Comments: (Patient consented for risks of anesthesia including but not limited to:  - adverse reactions to medications - risk of intubation if required - damage to teeth, lips or other oral mucosa - sore throat or hoarseness - Damage to heart, brain, lungs or loss of life  Patient voiced understanding.)        Anesthesia Quick Evaluation

## 2018-08-12 NOTE — Anesthesia Postprocedure Evaluation (Signed)
Anesthesia Post Note  Patient: Jill Hudson  Procedure(s) Performed: COLONOSCOPY WITH PROPOFOL (N/A )  Patient location during evaluation: Endoscopy Anesthesia Type: General Level of consciousness: awake and alert Pain management: pain level controlled Vital Signs Assessment: post-procedure vital signs reviewed and stable Respiratory status: spontaneous breathing, nonlabored ventilation, respiratory function stable and patient connected to nasal cannula oxygen Cardiovascular status: blood pressure returned to baseline and stable Postop Assessment: no apparent nausea or vomiting Anesthetic complications: no     Last Vitals:  Vitals:   08/12/18 1627 08/12/18 1712  BP: (!) 142/45 (!) 142/60  Pulse: 68 68  Resp: 16 14  Temp:  (!) 36.3 C  SpO2: 96% 97%    Last Pain:  Vitals:   08/12/18 1712  TempSrc: Oral  PainSc:                  Precious Haws Piscit[place]

## 2018-08-12 NOTE — Progress Notes (Signed)
The patient had a lower GI bleed without any further bleeding on colonoscopy.  The likely source was diverticular.  Nothing that is amenable to endoscopy.  If the patient has any further bleeding then vascular surgery or general surgery should be consulted for possible colectomy versus angiography. I will sign off.  Please call if any further GI concerns or questions.  We would like to thank you for the opportunity to participate in the care of Jill Hudson.

## 2018-08-13 LAB — PREPARE RBC (CROSSMATCH)

## 2018-08-13 LAB — HEMOGLOBIN AND HEMATOCRIT, BLOOD
HCT: 21.9 % — ABNORMAL LOW (ref 36.0–46.0)
HCT: 27.8 % — ABNORMAL LOW (ref 36.0–46.0)
Hemoglobin: 6.8 g/dL — ABNORMAL LOW (ref 12.0–15.0)
Hemoglobin: 9 g/dL — ABNORMAL LOW (ref 12.0–15.0)

## 2018-08-13 LAB — GLUCOSE, CAPILLARY
GLUCOSE-CAPILLARY: 163 mg/dL — AB (ref 70–99)
GLUCOSE-CAPILLARY: 229 mg/dL — AB (ref 70–99)

## 2018-08-13 MED ORDER — FAMOTIDINE 20 MG PO TABS
20.0000 mg | ORAL_TABLET | Freq: Two times a day (BID) | ORAL | Status: DC
  Administered 2018-08-13: 20 mg via ORAL
  Filled 2018-08-13: qty 1

## 2018-08-13 MED ORDER — SODIUM CHLORIDE 0.9% IV SOLUTION
Freq: Once | INTRAVENOUS | Status: AC
  Administered 2018-08-13: 10:00:00 via INTRAVENOUS

## 2018-08-13 NOTE — Progress Notes (Signed)
PHARMACIST - PHYSICIAN COMMUNICATION  DR:   Darvin Neighbours  CONCERNING: IV to Oral Route Change Policy  RECOMMENDATION: This patient is receiving famotidine by the intravenous route.  Based on criteria approved by the Pharmacy and Therapeutics Committee, the intravenous medication(s) is/are being converted to the equivalent oral dose form(s).  Per note from Dr. Allen Norris 10/18, no evidence of further bleeding on colonoscopy.  DESCRIPTION: These criteria include:  The patient is eating (either orally or via tube) and/or has been taking other orally administered medications for a least 24 hours  The patient has no evidence of active gastrointestinal bleeding or impaired GI absorption (gastrectomy, short bowel, patient on TNA or NPO).  If you have questions about this conversion, please contact the Pharmacy Department  []   [phone] )  [facility] [x]   [phone] )  [facility] []   [phone] )  [facility] []   [phone] )  [facility] []   ( [phone] )  [place], PharmD Pharmacy Resident  08/13/2018 11:26 AM

## 2018-08-13 NOTE — Discharge Instructions (Signed)
Resume diet and activity as before  You can restart taking your aspirin and Plavix from 08/16/2018 if there is no further bleeding.

## 2018-08-14 LAB — BPAM RBC
Blood Product Expiration Date: 201911182359
Blood Product Expiration Date: 201911182359
ISSUE DATE / TIME: 201910190942
Unit Type and Rh: 6200
Unit Type and Rh: 6200

## 2018-08-14 LAB — TYPE AND SCREEN
ABO/RH(D): A POS
Antibody Screen: NEGATIVE
UNIT DIVISION: 0
Unit division: 0

## 2018-08-14 LAB — PREPARE RBC (CROSSMATCH)

## 2018-08-15 ENCOUNTER — Encounter (INDEPENDENT_AMBULATORY_CARE_PROVIDER_SITE_OTHER): Payer: Medicare Other

## 2018-08-15 ENCOUNTER — Other Ambulatory Visit (INDEPENDENT_AMBULATORY_CARE_PROVIDER_SITE_OTHER): Payer: Self-pay | Admitting: Vascular Surgery

## 2018-08-15 ENCOUNTER — Encounter (INDEPENDENT_AMBULATORY_CARE_PROVIDER_SITE_OTHER): Payer: Self-pay

## 2018-08-15 ENCOUNTER — Ambulatory Visit (INDEPENDENT_AMBULATORY_CARE_PROVIDER_SITE_OTHER): Payer: Medicare Other

## 2018-08-15 ENCOUNTER — Telehealth: Payer: Self-pay

## 2018-08-15 ENCOUNTER — Other Ambulatory Visit (INDEPENDENT_AMBULATORY_CARE_PROVIDER_SITE_OTHER): Payer: Medicare Other

## 2018-08-15 ENCOUNTER — Ambulatory Visit (INDEPENDENT_AMBULATORY_CARE_PROVIDER_SITE_OTHER): Payer: Medicare Other | Admitting: Vascular Surgery

## 2018-08-15 ENCOUNTER — Encounter: Payer: Self-pay | Admitting: Gastroenterology

## 2018-08-15 VITALS — BP 168/56 | HR 62 | Resp 17 | Ht 62.0 in | Wt 123.0 lb

## 2018-08-15 DIAGNOSIS — I2510 Atherosclerotic heart disease of native coronary artery without angina pectoris: Secondary | ICD-10-CM

## 2018-08-15 DIAGNOSIS — I70223 Atherosclerosis of native arteries of extremities with rest pain, bilateral legs: Secondary | ICD-10-CM | POA: Diagnosis not present

## 2018-08-15 DIAGNOSIS — I10 Essential (primary) hypertension: Secondary | ICD-10-CM | POA: Diagnosis not present

## 2018-08-15 DIAGNOSIS — E782 Mixed hyperlipidemia: Secondary | ICD-10-CM | POA: Diagnosis not present

## 2018-08-15 DIAGNOSIS — N186 End stage renal disease: Secondary | ICD-10-CM

## 2018-08-15 DIAGNOSIS — I6523 Occlusion and stenosis of bilateral carotid arteries: Secondary | ICD-10-CM

## 2018-08-15 DIAGNOSIS — I739 Peripheral vascular disease, unspecified: Secondary | ICD-10-CM | POA: Diagnosis not present

## 2018-08-15 DIAGNOSIS — I771 Stricture of artery: Secondary | ICD-10-CM | POA: Diagnosis not present

## 2018-08-15 DIAGNOSIS — Z87891 Personal history of nicotine dependence: Secondary | ICD-10-CM

## 2018-08-15 NOTE — Telephone Encounter (Signed)
I have made the 1st attempt to contact the patient or family member in charge, in order to follow up from recently being discharged from the hospital. I was unable to leave a message but will call back at a later time

## 2018-08-15 NOTE — Progress Notes (Signed)
MRN : [id number]  Jill Hudson is a 83 y.o. (16-Dec-1934) female who presents with chief complaint of  Chief Complaint  Patient presents with  . Follow-up    Vein mapping and Arterial  .  History of Present Illness:   The patient returns to the office for followup and review status post angiogram with intervention. The patient notes minimal improvement in the lower extremity symptoms. No interval shortening of the patient's claudication distance no rest pain symptoms.  No new ulcers or wounds have occurred since the last visit.  There have been no significant changes to the patient's overall health care.  The patient denies amaurosis fugax or recent TIA symptoms. There are no recent neurological changes noted. The patient denies history of DVT, PE or superficial thrombophlebitis. The patient denies recent episodes of angina or shortness of breath.   Duplex US of the lower extremity arterial system shows small GSV and SVV not suitable for bypass  Current Meds  Medication Sig  . acetaminophen (TYLENOL) 650 MG CR tablet Take 1,300 mg by mouth every 8 (eight) hours as needed for pain.  Marland Kitchen amLODipine (NORVASC) 2.5 MG tablet Take 2.5 mg by mouth daily.  Marland Kitchen aspirin 81 MG tablet Take 81 mg by mouth daily.   . B Complex Vitamins (VITAMIN-B COMPLEX) TABS Take 1 tablet by mouth daily.   . baclofen (LIORESAL) 10 MG tablet TAKE 1 TABLET(10 MG) BY MOUTH THREE TIMES DAILY (Patient taking differently: Take 10 mg by mouth at bedtime. )  . Biotin 10 MG TABS Take 10 mg by mouth daily.   . Calcium-Magnesium-Zinc 333-133-5 MG TABS Take by mouth.  . chlorthalidone (HYGROTON) 25 MG tablet Take 25 mg by mouth daily.  . Cholecalciferol (VITAMIN D3 PO) Take 1 capsule by mouth 2 (two) times daily.  . Cyanocobalamin (RA VITAMIN B-12 TR) 1000 MCG TBCR Take 1,000 mcg by mouth daily.   Marland Kitchen diltiazem (CARTIA XT) 180 MG 24 hr capsule Take 180 mg by mouth 2 (two) times daily.   . insulin aspart (NOVOLOG FLEXPEN) 100  UNIT/ML FlexPen Inject 4 Units into the skin daily.   . Insulin Glargine (BASAGLAR KWIKPEN) 100 UNIT/ML SOPN Inject 12 Units into the skin daily.  Marland Kitchen loratadine (CLARITIN) 10 MG tablet Take 10 mg by mouth daily as needed for allergies.   Marland Kitchen losartan (COZAAR) 50 MG tablet Take 50 mg by mouth daily.   . metFORMIN (GLUCOPHAGE-XR) 500 MG 24 hr tablet TAKE ONE TABLET BY MOUTH TWICE DAILY (Patient taking differently: Take 500 mg by mouth 2 (two) times daily. )  . Multiple Vitamins-Minerals (MULTIVITAMIN PO) Take 1 tablet by mouth daily.  . Multiple Vitamins-Minerals (PRESERVISION AREDS PO) Take 1 capsule by mouth 2 (two) times daily.   . rosuvastatin (CRESTOR) 5 MG tablet Take 5 mg by mouth See admin instructions. Take 5 mg by mouth daily on Monday, Wednesday, Friday and Saturday.    Past Medical History:  Diagnosis Date  . Anxiety   . Arthritis    spine and shoulder  . Cancer ([place])    skin  . Diabetes mellitus without complication ([place])   . Heart murmur   . Hyperlipidemia   . Hypertension   . Mitral and aortic valve disease   . Myocardial infarction [facility])    may have had a "light" heart attack  . Occasional tremors   . Shingles    patient unaware but daughter confirms. it was a long time ago  . Stroke [facility]) 01/2017  may have had a slight stroke  . TIA (transient ischemic attack) 01/2017  . UTI (urinary tract infection)   . Vascular disease, peripheral [facility])     Past Surgical History:  Procedure Laterality Date  . CARDIAC CATHETERIZATION  1998   40% LM, 95% Ramus interm  . CATARACT EXTRACTION, BILATERAL    . COLONOSCOPY WITH PROPOFOL N/A 08/12/2018   Procedure: COLONOSCOPY WITH PROPOFOL;  Surgeon: Lucilla Lame, MD;  Location: [facility] ENDOSCOPY;  Service: Endoscopy;  Laterality: N/A;  . EYE SURGERY Bilateral    cataract extractions  . LOWER EXTREMITY ANGIOGRAPHY Left 08/23/2017   Procedure: Lower Extremity Angiography;  Surgeon: Algernon Huxley, MD;  Location: [place] [facility];   Service: Cardiovascular;  Laterality: Left;  . LOWER EXTREMITY ANGIOGRAPHY Left 07/26/2018   Procedure: LOWER EXTREMITY ANGIOGRAPHY;  Surgeon: Katha Cabal, MD;  Location: [place] [facility];  Service: Cardiovascular;  Laterality: Left;  . PTCA  08/2013   Left common iliac  . PTCA  12/2012   left ext iliac  . TUBAL LIGATION      Social History Social History   Tobacco Use  . Smoking status: Former Smoker    Packs/day: 2.00    Years: 37.00    Pack years: 74.00    Types: Cigarettes    Last attempt to quit: 1980    Years since quitting: 39.8  . Smokeless tobacco: Never Used  . Tobacco comment: smoking cessation materials not required  Substance Use Topics  . Alcohol use: No    Alcohol/week: 0.0 standard drinks  . Drug use: No    Family History Family History  Problem Relation Age of Onset  . Dementia Mother   . Diabetes Father     Allergies  Allergen Reactions  . Saxagliptin Diarrhea  . Epinephrine Other (See Comments)    Patient does not remember what happens when she uses this  . Atorvastatin Other (See Comments)    Muscle aches  . Codeine Other (See Comments)    Upset stomach  . Ezetimibe Other (See Comments)    Myalgias(ZETIA)  . Limonene Rash    Patient does not recall this reaction  . Nitrofurantoin Rash and Other (See Comments)    Pruitus  . Sulfa Antibiotics Rash and Other (See Comments)    Sore mouth      REVIEW OF SYSTEMS (Negative unless checked)  Constitutional: [] Weight loss  [] Fever  [] Chills Cardiac: [] Chest pain   [] Chest pressure   [] Palpitations   [] Shortness of breath when laying flat   [] Shortness of breath with exertion. Vascular:  [x] Pain in legs with walking   [] Pain in legs at rest  [] History of DVT   [] Phlebitis   [] Swelling in legs   [] Varicose veins   [] Non-healing ulcers Pulmonary:   [] Uses home oxygen   [] Productive cough   [] Hemoptysis   [] Wheeze  [] COPD   [] Asthma Neurologic:  [] Dizziness   [] Seizures   [] History of  stroke   [] History of TIA  [] Aphasia   [] Vissual changes   [] Weakness or numbness in arm   [] Weakness or numbness in leg Musculoskeletal:   [] Joint swelling   [] Joint pain   [] Low back pain Hematologic:  [] Easy bruising  [] Easy bleeding   [] Hypercoagulable state   [] Anemic Gastrointestinal:  [] Diarrhea   [] Vomiting  [] Gastroesophageal reflux/heartburn   [] Difficulty swallowing. Genitourinary:  [] Chronic kidney disease   [] Difficult urination  [] Frequent urination   [] Blood in urine Skin:  [] Rashes   [] Ulcers  Psychological:  []   History of anxiety   []  History of major depression.  Physical Examination  Vitals:   08/15/18 0836  BP: (!) 168/56  Pulse: 62  Resp: 17  Weight: 123 lb (55.8 kg)  Height: 5\' 2"  (1.575 m)   Body mass index is 22.5 kg/m. Gen: WD/WN, NAD Head: [place]/AT, No temporalis wasting.  Ear/Nose/Throat: Hearing grossly intact, nares w/o erythema or drainage Eyes: PER, EOMI, sclera nonicteric.  Neck: Supple, no large masses.   Pulmonary:  Good air movement, no audible wheezing bilaterally, no use of accessory muscles.  Cardiac: RRR, no JVD Vascular:  Vessel Right Left  Radial Palpable Palpable  PT Not Palpable Not Palpable  DP Not Palpable Not Palpable  Gastrointestinal: Non-distended. No guarding/no peritoneal signs.  Musculoskeletal: M/S 5/5 throughout.  No deformity or atrophy.  Neurologic: CN 2-12 intact. Symmetrical.  Speech is fluent. Motor exam as listed above. Psychiatric: Judgment intact, Mood & affect appropriate for pt's clinical situation. Dermatologic: No rashes or ulcers noted.  No changes consistent with cellulitis. Lymph : No lichenification or skin changes of chronic lymphedema.  CBC Lab Results  Component Value Date   WBC 7.7 08/12/2018   HGB 9.0 (L) 08/13/2018   HCT 27.8 (L) 08/13/2018   MCV 91.2 08/12/2018   PLT 249 08/12/2018    BMET    Component Value Date/Time   NA 138 08/11/2018 1023   NA 136 01/16/2016 1135   NA 138 05/29/2014 1202    K 4.7 08/11/2018 1023   K 5.1 05/29/2014 1202   CL 107 08/11/2018 1023   CL 106 05/29/2014 1202   CO2 23 08/11/2018 1023   CO2 25 05/29/2014 1202   GLUCOSE 178 (H) 08/11/2018 1023   GLUCOSE 269 (H) 05/29/2014 1202   BUN 31 (H) 08/11/2018 1023   BUN 19 01/16/2016 1135   BUN 19 (H) 05/29/2014 1202   CREATININE 1.25 (H) 08/11/2018 1023   CREATININE 1.10 05/29/2014 1202   CALCIUM 9.2 08/11/2018 1023   CALCIUM 9.1 05/29/2014 1202   GFRNONAA 39 (L) 08/11/2018 1023   GFRNONAA 48 (L) 05/29/2014 1202   GFRAA 45 (L) 08/11/2018 1023   GFRAA 56 (L) 05/29/2014 1202   Estimated Creatinine Clearance: 27.9 mL/min (A) (by C-G formula based on SCr of 1.25 mg/dL (H)).  COAG Lab Results  Component Value Date   INR 1.08 08/11/2018   INR 1.08 04/09/2017   INR 1.06 02/15/2017    Radiology No results found.   Assessment/Plan 1. PAD (peripheral artery disease) (HCC)  Recommend:  The patient has evidence of atherosclerosis of the lower extremities with claudication.  The patient does not  Have tissue loss or severe rest pain at this point in time.  To reconstruct her current PAD would require very complex procedure and so given the lack of critical ischemia I do not recommend surgery at this point.  No invasive studies, angiography or surgery at this time The patient should continue walking and begin a more formal exercise program.  The patient should continue antiplatelet therapy and aggressive treatment of the lipid abnormalities  No changes in the patient's medications at this time  The patient should continue wearing graduated compression socks 10-15 mmHg strength to control the mild edema.   - VAS [place] ABI WITH/WO TBI; Future  2. Bilateral carotid artery stenosis Recommend:  Given the patient's asymptomatic subcritical stenosis no further invasive testing or surgery at this time.  Duplex ultrasound shows <75% stenosis bilaterally.  Continue antiplatelet therapy as  prescribed Continue management  of CAD, HTN and Hyperlipidemia Healthy heart diet,  encouraged exercise at least 4 times per week Follow up in 12 months with duplex ultrasound and physical exam   3. Arteriosclerosis of coronary artery Continue cardiac and antihypertensive medications as already ordered and reviewed, no changes at this time.  Continue statin as ordered and reviewed, no changes at this time  Nitrates PRN for chest pain   4. Essential (primary) hypertension Continue antihypertensive medications as already ordered, these medications have been reviewed and there are no changes at this time.   5. Mixed hyperlipidemia Continue statin as ordered and reviewed, no changes at this time     Hortencia Pilar, MD  08/15/2018 8:38 AM

## 2018-08-16 DIAGNOSIS — E113313 Type 2 diabetes mellitus with moderate nonproliferative diabetic retinopathy with macular edema, bilateral: Secondary | ICD-10-CM | POA: Diagnosis not present

## 2018-08-16 DIAGNOSIS — H353211 Exudative age-related macular degeneration, right eye, with active choroidal neovascularization: Secondary | ICD-10-CM | POA: Diagnosis not present

## 2018-08-16 LAB — HM DIABETES EYE EXAM

## 2018-08-17 ENCOUNTER — Encounter: Payer: Self-pay | Admitting: Internal Medicine

## 2018-08-17 NOTE — Discharge Summary (Signed)
[facility] [place] NAME: Jill Hudson    MR#:  [id number]  DATE OF BIRTH:  [birth year]  DATE OF ADMISSION:  08/11/2018 ADMITTING PHYSICIAN: Gorden Harms, MD  DATE OF DISCHARGE: 08/13/2018  6:30 PM  PRIMARY CARE PHYSICIAN: [name], MD   ADMISSION DIAGNOSIS:  Rectal bleeding [K62.5]  DISCHARGE DIAGNOSIS:  Active Problems:   GIB (gastrointestinal bleeding)   SECONDARY DIAGNOSIS:   Past Medical History:  Diagnosis Date  . Anxiety   . Arthritis    spine and shoulder  . Cancer ([place])    skin  . Diabetes mellitus without complication ([place])   . Heart murmur   . Hyperlipidemia   . Hypertension   . Mitral and aortic valve disease   . Myocardial infarction [facility])    may have had a "light" heart attack  . Occasional tremors   . Shingles    patient unaware but daughter confirms. it was a long time ago  . Stroke [facility]) 01/2017   may have had a slight stroke  . TIA (transient ischemic attack) 01/2017  . UTI (urinary tract infection)   . Vascular disease, peripheral ([place])      ADMITTING HISTORY  HISTORY OF PRESENT ILLNESS: Jill Hudson  is a 83 y.o. female with a known history per below which also includes hemorrhoids, presenting with bright red blood per rectum since yesterday, 3 episodes mixed with stool, in the emergency room patient was found to have hemoglobin 8.7, blood pressure 183/52, patient evaluated emergency room, no apparent distress, resting comfortably in bed, denies any pain, patient stated last colonoscopy was normal, patient now be admitted for acute bright red blood per rectum most likely secondary to diverticular disease.  HOSPITAL COURSE:   *AcuteBRBPR Patient transfused packed RBC due to drop in hemoglobin. Hemoglobin improved and stable Patient had EGD and colonoscopy which showed nothing acute.  Bleeding is thought to be likely diverticular and hemorrhoidal Aspirin and Plavix held in the hospital.   Patient will restart this in 4 days if no further bleeding.  *Acute blood loss anemia Secondary to gi bleed 1 unit packed RBC transfused during hospital stay  * Diabetes mellitus type 2, controlled Well controlled  * HTN continued home meds Hydralazine IV as needed  *History ofCVA On aspirin and Plavix  Patient discharged home in stable condition.  Likely has chronic GI losses.  Will need follow-up with primary care physician in a week.  CONSULTS OBTAINED:    DRUG ALLERGIES:   Allergies  Allergen Reactions  . Saxagliptin Diarrhea  . Epinephrine Other (See Comments)    Patient does not remember what happens when she uses this  . Atorvastatin Other (See Comments)    Muscle aches  . Codeine Other (See Comments)    Upset stomach  . Ezetimibe Other (See Comments)    Myalgias(ZETIA)  . Limonene Rash    Patient does not recall this reaction  . Nitrofurantoin Rash and Other (See Comments)    Pruitus  . Sulfa Antibiotics Rash and Other (See Comments)    Sore mouth     DISCHARGE MEDICATIONS:   Allergies as of 08/13/2018      Reactions   Saxagliptin Diarrhea   Epinephrine Other (See Comments)   Patient does not remember what happens when she uses this   Atorvastatin Other (See Comments)   Muscle aches   Codeine Other (See Comments)   Upset stomach   Ezetimibe Other (See Comments)  Myalgias(ZETIA)   Limonene Rash   Patient does not recall this reaction   Nitrofurantoin Rash, Other (See Comments)   Pruitus   Sulfa Antibiotics Rash, Other (See Comments)   Sore mouth       Medication List    TAKE these medications   acetaminophen 650 MG CR tablet Commonly known as:  TYLENOL Take 1,300 mg by mouth every 8 (eight) hours as needed for pain.   amLODipine 2.5 MG tablet Commonly known as:  NORVASC Take 2.5 mg by mouth daily.   aspirin 81 MG tablet Take 81 mg by mouth daily.   baclofen 10 MG tablet Commonly known as:  LIORESAL TAKE 1 TABLET(10 MG) BY  MOUTH THREE TIMES DAILY What changed:  See the new instructions.   BASAGLAR KWIKPEN 100 UNIT/ML Sopn Inject 12 Units into the skin daily.   Biotin 10 MG Tabs Take 10 mg by mouth daily.   CARTIA XT 180 MG 24 hr capsule Generic drug:  diltiazem Take 180 mg by mouth 2 (two) times daily.   chlorthalidone 25 MG tablet Commonly known as:  HYGROTON Take 25 mg by mouth daily.   clopidogrel 75 MG tablet Commonly known as:  PLAVIX Take 1 tablet (75 mg total) by mouth daily.   loratadine 10 MG tablet Commonly known as:  CLARITIN Take 10 mg by mouth daily as needed for allergies.   losartan 50 MG tablet Commonly known as:  COZAAR Take 50 mg by mouth daily.   metFORMIN 500 MG 24 hr tablet Commonly known as:  GLUCOPHAGE-XR TAKE ONE TABLET BY MOUTH TWICE DAILY   NOVOLOG FLEXPEN 100 UNIT/ML FlexPen Generic drug:  insulin aspart Inject 4 Units into the skin daily.   PRESERVISION AREDS PO Take 1 capsule by mouth 2 (two) times daily.   MULTIVITAMIN PO Take 1 tablet by mouth daily.   RA VITAMIN B-12 TR 1000 MCG Tbcr Generic drug:  Cyanocobalamin Take 1,000 mcg by mouth daily.   rosuvastatin 5 MG tablet Commonly known as:  CRESTOR Take 5 mg by mouth See admin instructions. Take 5 mg by mouth daily on Monday, Wednesday, Friday and Saturday.   VITAMIN D3 PO Take 1 capsule by mouth 2 (two) times daily.   Vitamin-B Complex Tabs Take 1 tablet by mouth daily.       Today   VITAL SIGNS:  Blood pressure (!) 146/61, pulse 67, temperature 98.3 F (36.8 C), temperature source Oral, resp. rate 16, height 5' 1.5" (1.562 m), weight 55.3 kg, SpO2 100 %.  I/O:  No intake or output data in the 24 hours ending 08/17/18 1849  PHYSICAL EXAMINATION:  Physical Exam  GENERAL:  83 y.o.-year-old patient lying in the bed with no acute distress.  LUNGS: Normal breath sounds bilaterally, no wheezing, rales,rhonchi or crepitation. No use of accessory muscles of respiration.  CARDIOVASCULAR:  S1, S2 normal. No murmurs, rubs, or gallops.  ABDOMEN: Soft, non-tender, non-distended. Bowel sounds present. No organomegaly or mass.  NEUROLOGIC: Moves all 4 extremities. PSYCHIATRIC: The patient is alert and oriented x 3.  SKIN: No obvious rash, lesion, or ulcer.   DATA REVIEW:   CBC Recent Labs  Lab 08/12/18 0520  08/13/18 1411  WBC 7.7  --   --   HGB 7.1*   < > 9.0*  HCT 22.9*   < > 27.8*  PLT 249  --   --    < > = values in this interval not displayed.    Chemistries  Recent Labs  Lab 08/11/18 1023  NA 138  K 4.7  CL 107  CO2 23  GLUCOSE 178*  BUN 31*  CREATININE 1.25*  CALCIUM 9.2  AST 19  ALT 15  ALKPHOS 75  BILITOT 0.6    Cardiac Enzymes No results for input(s): TROPONINI in the last 168 hours.  Microbiology Results  Results for orders placed or performed in visit on 12/21/16  Urine culture     Status: None   Collection Time: 12/21/16  4:00 PM  Result Value Ref Range Status   Urine Culture, Routine Final report  Final   Organism ID, Bacteria Comment  Final    Comment: Mixed urogenital flora Less than 10,000 colonies/mL     RADIOLOGY:  No results found.  Follow up with PCP in 1 week.  Management plans discussed with the patient, family and they are in agreement.  CODE STATUS:  Code Status History    Date Active Date Inactive Code Status Order ID Comments User Context   08/11/2018 1706 08/13/2018 2136 Full Code [id number]  Gorden Harms, MD Inpatient   07/26/2018 0921 07/26/2018 1652 Full Code [id number]  Katha Cabal, MD Inpatient   08/23/2017 0935 08/23/2017 1419 Full Code [id number]  Algernon Huxley, MD Inpatient   02/15/2017 1538 02/16/2017 1959 Full Code [id number]  Dustin Flock, MD Inpatient    Advance Directive Documentation     Most Recent Value  Type of Advance Directive  Healthcare Power of Attorney  Pre-existing out of facility DNR order (yellow form or pink MOST form)  -  "MOST" Form in Place?  -      TOTAL TIME TAKING  CARE OF THIS PATIENT ON DAY OF DISCHARGE: more than 30 minutes.   Leia Alf Daquann Merriott M.D on 08/17/2018 at 6:49 PM  Between 7am to 6pm - Pager - [phone]  After 6pm go to [email or web address] - password [email or web address] [facility]  [employer] [place] [facility]  Office  [phone]  CC: Primary care physician; [name], MD  Note: This dictation was prepared with Dragon dictation along with smaller phrase technology. Any transcriptional errors that result from this process are unintentional.

## 2018-08-21 ENCOUNTER — Encounter (INDEPENDENT_AMBULATORY_CARE_PROVIDER_SITE_OTHER): Payer: Self-pay | Admitting: Vascular Surgery

## 2018-08-23 ENCOUNTER — Ambulatory Visit (INDEPENDENT_AMBULATORY_CARE_PROVIDER_SITE_OTHER): Payer: Medicare Other | Admitting: Internal Medicine

## 2018-08-23 ENCOUNTER — Encounter: Payer: Self-pay | Admitting: Internal Medicine

## 2018-08-23 VITALS — BP 126/64 | HR 64 | Ht 62.0 in | Wt 124.8 lb

## 2018-08-23 DIAGNOSIS — K5791 Diverticulosis of intestine, part unspecified, without perforation or abscess with bleeding: Secondary | ICD-10-CM | POA: Diagnosis not present

## 2018-08-23 DIAGNOSIS — E113553 Type 2 diabetes mellitus with stable proliferative diabetic retinopathy, bilateral: Secondary | ICD-10-CM | POA: Diagnosis not present

## 2018-08-23 NOTE — Progress Notes (Signed)
Date:  08/23/2018   Name:  Jill Hudson   DOB:  03/02/1935   MRN:  [id number]   Chief Complaint: Hospitalization Follow-up (Needs recheck to see if still anemic or getting better. ) A TOC call was done on 08/15/18. Pt was hospitalized with a lower gi bleed at [facility] 08/11/18 to 08/13/18.  She was transfused with one unit PRBCs.  EGD and colonoscopy showed no source of bleeding which was thought to be diverticular.  Aspirin and Plavix were held.  Bleeding stopped spontaneously. Since being home, pt has had no further bleeding.  Stools are normal color and soft.  No pain, nausea or vomiting. She has had some edema of her right ankle since a vascular procedure done the week before admission. She has not been wearing the compression socks. Of note, pt was recently seen by Ophthalmology and had treatment for retinopathy. HPI  Review of Systems  Constitutional: Positive for fatigue. Negative for chills, diaphoresis, fever and unexpected weight change.  HENT: Negative for trouble swallowing.   Respiratory: Negative for chest tightness, shortness of breath and wheezing.   Cardiovascular: Positive for leg swelling. Negative for chest pain and palpitations.  Gastrointestinal: Negative for abdominal pain, anal bleeding, blood in stool, constipation, diarrhea and vomiting.  Allergic/Immunologic: Negative for environmental allergies.  Neurological: Negative for dizziness and headaches.    Patient Active Problem List   Diagnosis Date Noted  . GIB (gastrointestinal bleeding) 08/11/2018  . Leg pain 07/03/2017  . Carpal tunnel syndrome on both sides 05/05/2017  . Myalgia due to HMG CoA reductase inhibitor 05/05/2017  . CVA (cerebral vascular accident) ([place]) 02/15/2017  . Degenerative disc disease, lumbar 12/21/2016  . Atherosclerosis of native arteries of extremity with intermittent claudication ([place]) 12/20/2016  . Bilateral carotid artery stenosis 12/20/2016  . Hip bursitis 05/18/2016  . Elevated  TSH 01/18/2016  . Chronic renal insufficiency, stage III (moderate) ([place]) 01/16/2016  . Senile ecchymosis 01/16/2016  . Uncontrolled type 2 diabetes mellitus with diabetic polyneuropathy, with long-term current use of insulin ([place]) 09/16/2015  . GERD (gastroesophageal reflux disease) 07/24/2015  . PAD (peripheral artery disease) ([place]) 05/17/2015  . Neoplasm of uncertain behavior of skin 05/17/2015  . Retinopathy, diabetic, proliferative ([place]) 04/11/2015  . Mixed hyperlipidemia 04/11/2015  . Essential (primary) hypertension 04/11/2015  . Generalized OA 04/11/2015  . Arteriosclerosis of coronary artery 05/29/2013  . Hypertensive heart disease without CHF 05/29/2013    Allergies  Allergen Reactions  . Saxagliptin Diarrhea  . Epinephrine Other (See Comments)    Patient does not remember what happens when she uses this  . Atorvastatin Other (See Comments)    Muscle aches  . Codeine Other (See Comments)    Upset stomach  . Ezetimibe Other (See Comments)    Myalgias(ZETIA)  . Limonene Rash    Patient does not recall this reaction  . Nitrofurantoin Rash and Other (See Comments)    Pruitus  . Sulfa Antibiotics Rash and Other (See Comments)    Sore mouth     Past Surgical History:  Procedure Laterality Date  . CARDIAC CATHETERIZATION  1998   40% LM, 95% Ramus interm  . CATARACT EXTRACTION, BILATERAL    . COLONOSCOPY WITH PROPOFOL N/A 08/12/2018   Procedure: COLONOSCOPY WITH PROPOFOL;  Surgeon: Lucilla Lame, MD;  Location: [facility] ENDOSCOPY;  Service: Endoscopy;  Laterality: N/A;  . EYE SURGERY Bilateral    cataract extractions  . LOWER EXTREMITY ANGIOGRAPHY Left 08/23/2017   Procedure: Lower Extremity Angiography;  Surgeon: Lucky Cowboy,  Erskine Squibb, MD;  Location: [place] CV LAB;  Service: Cardiovascular;  Laterality: Left;  . LOWER EXTREMITY ANGIOGRAPHY Left 07/26/2018   Procedure: LOWER EXTREMITY ANGIOGRAPHY;  Surgeon: Katha Cabal, MD;  Location: [place] CV LAB;  Service:  Cardiovascular;  Laterality: Left;  . PTCA  08/2013   Left common iliac  . PTCA  12/2012   left ext iliac  . TUBAL LIGATION      Social History   Tobacco Use  . Smoking status: Former Smoker    Packs/day: 2.00    Years: 37.00    Pack years: 74.00    Types: Cigarettes    Last attempt to quit: 1980    Years since quitting: 39.8  . Smokeless tobacco: Never Used  . Tobacco comment: smoking cessation materials not required  Substance Use Topics  . Alcohol use: No    Alcohol/week: 0.0 standard drinks  . Drug use: No     Medication list has been reviewed and updated.  Current Meds  Medication Sig  . acetaminophen (TYLENOL) 650 MG CR tablet Take 1,300 mg by mouth every 8 (eight) hours as needed for pain.  Marland Kitchen amLODipine (NORVASC) 2.5 MG tablet Take 2.5 mg by mouth daily.  Marland Kitchen aspirin 81 MG tablet Take 81 mg by mouth daily.   . B Complex Vitamins (VITAMIN-B COMPLEX) TABS Take 1 tablet by mouth daily.   . baclofen (LIORESAL) 10 MG tablet TAKE 1 TABLET(10 MG) BY MOUTH THREE TIMES DAILY (Patient taking differently: Take 10 mg by mouth at bedtime. )  . Biotin 10 MG TABS Take 10 mg by mouth daily.   . Calcium-Magnesium-Zinc 333-133-5 MG TABS Take by mouth.  . chlorthalidone (HYGROTON) 25 MG tablet Take 25 mg by mouth daily.  . Cholecalciferol (VITAMIN D3 PO) Take 1 capsule by mouth 2 (two) times daily.  . Cyanocobalamin (RA VITAMIN B-12 TR) 1000 MCG TBCR Take 1,000 mcg by mouth daily.   Marland Kitchen diltiazem (CARTIA XT) 180 MG 24 hr capsule Take 180 mg by mouth 2 (two) times daily.   . insulin aspart (NOVOLOG FLEXPEN) 100 UNIT/ML FlexPen Inject 4 Units into the skin daily.   . Insulin Glargine (BASAGLAR KWIKPEN) 100 UNIT/ML SOPN Inject 12 Units into the skin daily.  Marland Kitchen loratadine (CLARITIN) 10 MG tablet Take 10 mg by mouth daily as needed for allergies.   Marland Kitchen losartan (COZAAR) 50 MG tablet Take 50 mg by mouth daily.   . metFORMIN (GLUCOPHAGE-XR) 500 MG 24 hr tablet TAKE ONE TABLET BY MOUTH TWICE DAILY  (Patient taking differently: Take 500 mg by mouth 2 (two) times daily. )  . Multiple Vitamins-Minerals (MULTIVITAMIN PO) Take 1 tablet by mouth daily.  . Multiple Vitamins-Minerals (PRESERVISION AREDS PO) Take 1 capsule by mouth 2 (two) times daily.   . rosuvastatin (CRESTOR) 5 MG tablet Take 5 mg by mouth See admin instructions. Take 5 mg by mouth daily on Monday, Wednesday, Friday and Saturday.    PHQ 2/9 Scores 06/15/2018 03/07/2018 06/02/2017 06/26/2016  PHQ - 2 Score 0 0 1 0  PHQ- 9 Score 0 - - -    Physical Exam  Constitutional: She is oriented to person, place, and time. She appears well-developed. No distress.  HENT:  Head: Normocephalic and atraumatic.  Neck: Normal range of motion. Neck supple.  Cardiovascular: Normal rate, regular rhythm and normal heart sounds.  Pulmonary/Chest: Effort normal and breath sounds normal. No respiratory distress.  Abdominal: Soft. Bowel sounds are normal. She exhibits no mass. There is  no tenderness. There is no guarding.  Musculoskeletal: Normal range of motion. She exhibits edema (1+ pitting edema right ankle).  Lymphadenopathy:    She has no cervical adenopathy.  Neurological: She is alert and oriented to person, place, and time.  Skin: Skin is warm and dry. No rash noted.  Psychiatric: She has a normal mood and affect. Her behavior is normal. Thought content normal.  Nursing note and vitals reviewed.   BP 126/64 (BP Location: Left Arm, Patient Position: Sitting, Cuff Size: Normal)   Pulse 64   Ht 5\' 2"  (1.575 m)   Wt 124 lb 12.8 oz (56.6 kg)   SpO2 100%   BMI 22.83 kg/m   Assessment and Plan: 1. Gastrointestinal hemorrhage associated with intestinal diverticulosis Resume Plavix Recheck H/H to determine if iron supplement is indicated - Hemoglobin and hematocrit, blood  2. Stable proliferative diabetic retinopathy of both eyes associated with type 2 diabetes mellitus ([place]) Followed by Opthalmology   Partially dictated using Radio producer. Any errors are unintentional.  [name], MD [facility]  08/23/2018

## 2018-08-24 LAB — HEMOGLOBIN AND HEMATOCRIT, BLOOD
HEMATOCRIT: 27.4 % — AB (ref 34.0–46.6)
HEMOGLOBIN: 8.9 g/dL — AB (ref 11.1–15.9)

## 2018-08-26 ENCOUNTER — Telehealth (INDEPENDENT_AMBULATORY_CARE_PROVIDER_SITE_OTHER): Payer: Self-pay

## 2018-08-26 NOTE — Telephone Encounter (Signed)
The patient had an angiogram with stent placement on July 26, 2018.  Who told her to stop her Plavix?  I do not see any mention of that in Schnier's recent note when he saw her a few days ago.  Did she have a bleeding event and was told to stop Plavix?  If she has not had any bleeding events or adverse reactions to the Plavix I am unsure as to why it was stopped considering she just had a stent placed.  If anything, the patient should be on both aspirin (81mg ) and Plavix.

## 2018-08-26 NOTE — Telephone Encounter (Signed)
The patient said she had stop taking the Plavix for procedure but was informed to start back taking Plavix by another provider.She wanted to make sure that she should be on the Plavix and aspirin but I inform her with Lilla Shook medical advice to take both medications.

## 2018-08-30 ENCOUNTER — Telehealth: Payer: Self-pay

## 2018-08-30 ENCOUNTER — Telehealth (INDEPENDENT_AMBULATORY_CARE_PROVIDER_SITE_OTHER): Payer: Self-pay

## 2018-08-30 NOTE — Telephone Encounter (Signed)
Patient called leaving VM stating she is still having pain in leg after having angioplasty with Dr [name]. Was told by Dr Waldron Labs to call our office and request for refferal to see Physical therapy for this. Jill Hudson he can not order referral because insurance will not pay for it.  Please Advise.   Patient said she is willing to come in for appt if needed for referral.

## 2018-08-31 ENCOUNTER — Telehealth: Payer: Self-pay | Admitting: Internal Medicine

## 2018-08-31 NOTE — Telephone Encounter (Signed)
Discussed with patient the recommendations from Vascular surgery.  She has not been doing a formal walking program.  She admits to some decreased balance and mobility over the past few years but she does not want to go to PTx at this time. She has a flat paved driveway that is about 300 feet in length.  I recommend that she start walking daily, work up to 30 minutes total in 2-3 sessions. She will call if she decides she would also like a PTx referral.

## 2018-09-01 ENCOUNTER — Telehealth (INDEPENDENT_AMBULATORY_CARE_PROVIDER_SITE_OTHER): Payer: Self-pay

## 2018-09-01 ENCOUNTER — Other Ambulatory Visit (INDEPENDENT_AMBULATORY_CARE_PROVIDER_SITE_OTHER): Payer: Self-pay | Admitting: Nurse Practitioner

## 2018-09-01 DIAGNOSIS — I70223 Atherosclerosis of native arteries of extremities with rest pain, bilateral legs: Secondary | ICD-10-CM

## 2018-09-01 DIAGNOSIS — I739 Peripheral vascular disease, unspecified: Secondary | ICD-10-CM

## 2018-09-01 NOTE — Progress Notes (Signed)
Patient has complaints of constant thigh pain radiating down to her ankle.  Concerns of worsening aorta-illiac disease.  Will obtain CT angio abdomen and pelvis for assessment of vascular status as well as for possible intervention planning.

## 2018-09-01 NOTE — Telephone Encounter (Signed)
The patient called stating she is continuing to have left leg pain so Arna Medici NP consulted with Schnier and we are concerned with worsening of PAD.He wants to get a CT scan of the abdomen & pelvis and then follow up in the office with abi's. We will try to get the CT scan within the next week or so.When the patient come in the office for the follow up she will get abi's done also.

## 2018-09-04 ENCOUNTER — Ambulatory Visit
Admission: EM | Admit: 2018-09-04 | Discharge: 2018-09-04 | Disposition: A | Discharge status: Home / Self Care | Payer: Medicare Other | Attending: Family Medicine | Admitting: Family Medicine

## 2018-09-04 ENCOUNTER — Other Ambulatory Visit: Payer: Self-pay

## 2018-09-04 ENCOUNTER — Encounter: Payer: Self-pay | Admitting: Emergency Medicine

## 2018-09-04 DIAGNOSIS — I70219 Atherosclerosis of native arteries of extremities with intermittent claudication, unspecified extremity: Secondary | ICD-10-CM | POA: Diagnosis not present

## 2018-09-04 NOTE — ED Triage Notes (Addendum)
Patient c/o ongoing left leg pain for the past several weeks.  Patient denies injury or fall.

## 2018-09-04 NOTE — Discharge Instructions (Addendum)
Increase baclofen to 10 mg 3 times daily for your pain.  Continue your walking program as tolerated.  Contact Dr. Lowella Fairy in the morning.  Today your Doppler pulses were present in your knee on down to your foot.

## 2018-09-04 NOTE — ED Provider Notes (Signed)
[facility]    CSN: [id number] Arrival date & time: 09/04/18  0857     History   Chief Complaint Chief Complaint  Patient presents with  . Leg Pain    left leg    HPI Jill Hudson is a 83 y.o. female.   HPI  83 year old female accompanied by her daughter presents with ongoing left leg pain that she is had for the past several weeks.  Known peripheral artery disease and has undergone stenting in that leg in the past.  No injury or fall.  Is under the care of Dr. Franchot Gallo she contacted on Thursday and the office told her they were going to schedule a CT scan but she has not heard back from that office.  Dr. Franchot Gallo placed  her on a walking program of 30 minutes a day but her daughter says she looking to walk even 2 minutes before she has enough pain she has to stop.  The pain is now present at rest.  She has  walking claudication as well.  Last night she was having a lot of pain she took melatonin and baclofen and baclofen does seem to help her with the pain but it has the side effect of causing her to be very sleepy.         Past Medical History:  Diagnosis Date  . Anxiety   . Arthritis    spine and shoulder  . Cancer ([place])    skin  . Diabetes mellitus without complication ([place])   . Heart murmur   . Hyperlipidemia   . Hypertension   . Mitral and aortic valve disease   . Myocardial infarction [facility])    may have had a "light" heart attack  . Occasional tremors   . Shingles    patient unaware but daughter confirms. it was a long time ago  . Stroke [facility]) 01/2017   may have had a slight stroke  . TIA (transient ischemic attack) 01/2017  . UTI (urinary tract infection)   . Vascular disease, peripheral [facility])     Patient Active Problem List   Diagnosis Date Noted  . GIB (gastrointestinal bleeding) 08/11/2018  . Leg pain 07/03/2017  . Carpal tunnel syndrome on both sides 05/05/2017  . Myalgia due to HMG CoA reductase inhibitor 05/05/2017  . CVA  (cerebral vascular accident) ([place]) 02/15/2017  . Degenerative disc disease, lumbar 12/21/2016  . Atherosclerosis of native arteries of extremity with intermittent claudication ([place]) 12/20/2016  . Bilateral carotid artery stenosis 12/20/2016  . Hip bursitis 05/18/2016  . Elevated TSH 01/18/2016  . Chronic renal insufficiency, stage III (moderate) ([place]) 01/16/2016  . Senile ecchymosis 01/16/2016  . Uncontrolled type 2 diabetes mellitus with diabetic polyneuropathy, with long-term current use of insulin ([place]) 09/16/2015  . GERD (gastroesophageal reflux disease) 07/24/2015  . PAD (peripheral artery disease) ([place]) 05/17/2015  . Neoplasm of uncertain behavior of skin 05/17/2015  . Retinopathy, diabetic, proliferative ([place]) 04/11/2015  . Mixed hyperlipidemia 04/11/2015  . Essential (primary) hypertension 04/11/2015  . Generalized OA 04/11/2015  . Arteriosclerosis of coronary artery 05/29/2013  . Hypertensive heart disease without CHF 05/29/2013    Past Surgical History:  Procedure Laterality Date  . CARDIAC CATHETERIZATION  1998   40% LM, 95% Ramus interm  . CATARACT EXTRACTION, BILATERAL    . COLONOSCOPY WITH PROPOFOL N/A 08/12/2018   Procedure: COLONOSCOPY WITH PROPOFOL;  Surgeon: Lucilla Lame, MD;  Location: [facility] ENDOSCOPY;  Service: Endoscopy;  Laterality: N/A;  . EYE SURGERY Bilateral  cataract extractions  . LOWER EXTREMITY ANGIOGRAPHY Left 08/23/2017   Procedure: Lower Extremity Angiography;  Surgeon: Algernon Huxley, MD;  Location: [place] [facility];  Service: Cardiovascular;  Laterality: Left;  . LOWER EXTREMITY ANGIOGRAPHY Left 07/26/2018   Procedure: LOWER EXTREMITY ANGIOGRAPHY;  Surgeon: Katha Cabal, MD;  Location: [place] [facility];  Service: Cardiovascular;  Laterality: Left;  . PTCA  08/2013   Left common iliac  . PTCA  12/2012   left ext iliac  . TUBAL LIGATION      OB History   None      Home Medications    Prior to Admission medications     Medication Sig Start Date End Date Taking? Authorizing Provider  amLODipine (NORVASC) 2.5 MG tablet Take 2.5 mg by mouth daily. 05/20/18  Yes [provider]  aspirin 81 MG tablet Take 81 mg by mouth daily.    Yes [provider]  B Complex Vitamins (VITAMIN-B COMPLEX) TABS Take 1 tablet by mouth daily.    Yes [provider]  baclofen (LIORESAL) 10 MG tablet TAKE 1 TABLET(10 MG) BY MOUTH THREE TIMES DAILY Patient taking differently: Take 10 mg by mouth at bedtime.  02/09/18  Yes [name], MD  Biotin 10 MG TABS Take 10 mg by mouth daily.    Yes [provider]  Calcium-Magnesium-Zinc [phone] MG TABS Take by mouth.   Yes [provider]  chlorthalidone (HYGROTON) 25 MG tablet Take 25 mg by mouth daily.   Yes [provider]  Cholecalciferol (VITAMIN D3 PO) Take 1 capsule by mouth 2 (two) times daily.   Yes [provider]  clopidogrel (PLAVIX) 75 MG tablet Take 1 tablet (75 mg total) by mouth daily. 07/27/18  Yes Schnier, Dolores Lory, MD  Cyanocobalamin (RA VITAMIN B-12 TR) 1000 MCG TBCR Take 1,000 mcg by mouth daily.    Yes [provider]  diltiazem (CARTIA XT) 180 MG 24 hr capsule Take 180 mg by mouth 2 (two) times daily.    Yes [provider]  insulin aspart (NOVOLOG FLEXPEN) 100 UNIT/ML FlexPen Inject 4 Units into the skin daily.  03/26/17  Yes [provider]  Insulin Glargine (BASAGLAR KWIKPEN) 100 UNIT/ML SOPN Inject 12 Units into the skin daily.   Yes [provider]  loratadine (CLARITIN) 10 MG tablet Take 10 mg by mouth daily as needed for allergies.    Yes [provider]  losartan (COZAAR) 50 MG tablet Take 50 mg by mouth daily.  06/20/18  Yes [provider]  metFORMIN (GLUCOPHAGE-XR) 500 MG 24 hr tablet TAKE ONE TABLET BY MOUTH TWICE DAILY Patient taking differently: Take 500 mg by mouth 2 (two) times daily.  01/26/18  Yes [name], MD  Multiple  Vitamins-Minerals (MULTIVITAMIN PO) Take 1 tablet by mouth daily.   Yes [provider]  rosuvastatin (CRESTOR) 5 MG tablet Take 5 mg by mouth See admin instructions. Take 5 mg by mouth daily on Monday, Wednesday, Friday and Saturday. 03/02/18  Yes [provider]  acetaminophen (TYLENOL) 650 MG CR tablet Take 1,300 mg by mouth every 8 (eight) hours as needed for pain.    [provider]  Multiple Vitamins-Minerals (PRESERVISION AREDS PO) Take 1 capsule by mouth 2 (two) times daily.     [provider]    Family History Family History  Problem Relation Age of Onset  . Dementia Mother   . Diabetes Father     Social History Social  History   Tobacco Use  . Smoking status: Former Smoker    Packs/day: 2.00    Years: 37.00    Pack years: 74.00    Types: Cigarettes    Last attempt to quit: 1980    Years since quitting: 39.8  . Smokeless tobacco: Never Used  . Tobacco comment: smoking cessation materials not required  Substance Use Topics  . Alcohol use: No    Alcohol/week: 0.0 standard drinks  . Drug use: No     Allergies   Saxagliptin; Epinephrine; Atorvastatin; Codeine; Ezetimibe; Limonene; Nitrofurantoin; and Sulfa antibiotics   Review of Systems Review of Systems  Constitutional: Positive for activity change. Negative for chills, fatigue and fever.  Cardiovascular: Positive for leg swelling.  Musculoskeletal: Positive for gait problem.  All other systems reviewed and are negative.    Physical Exam Triage Vital Signs ED Triage Vitals  Enc Vitals Group     BP 09/04/18 0907 (!) 183/56     Pulse Rate 09/04/18 0907 74     Resp 09/04/18 0907 14     Temp 09/04/18 0907 98.3 F (36.8 C)     Temp Source 09/04/18 0907 Oral     SpO2 09/04/18 0907 100 %     Weight 09/04/18 0904 125 lb (56.7 kg)     Height 09/04/18 0904 5' 1.5" (1.562 m)     Head Circumference --      Peak Flow --      Pain Score 09/04/18 0903 9     Pain Loc --      Pain  Edu? --      Excl. in [place]? --    No data found.  Updated Vital Signs BP (!) 183/56 (BP Location: Left Arm) Comment: Patient states that she has not taken her BP medicine this morning.   Pulse 74   Temp 98.3 F (36.8 C) (Oral)   Resp 14   Ht 5' 1.5" (1.562 m)   Wt 125 lb (56.7 kg)   SpO2 100%   BMI 23.24 kg/m   Visual Acuity Right Eye Distance:   Left Eye Distance:   Bilateral Distance:    Right Eye Near:   Left Eye Near:    Bilateral Near:     Physical Exam  Constitutional: She is oriented to person, place, and time. She appears well-developed and well-nourished. No distress.  HENT:  Head: Normocephalic.  Eyes: Pupils are equal, round, and reactive to light.  Neck: Normal range of motion.  Cardiovascular: Intact distal pulses.  She does not have palpable peripheral pulses in her left lower extremity.  However Doppler does confirm pulses at the popliteal dorsalis pedis and posterior tibialis arteries.  Swelling of the left leg which is surgical according to the patient.  She is to be wearing compression hose although  is not worn today.  Her left leg is noticeably less edematous.  Musculoskeletal: Normal range of motion.  Neurological: She is alert and oriented to person, place, and time.  Skin: Skin is dry. She is not diaphoretic.  Psychiatric: She has a normal mood and affect. Her behavior is normal. Judgment and thought content normal.  Nursing note and vitals reviewed.    UC Treatments / Results  Labs (all labs ordered are listed, but only abnormal results are displayed) Labs Reviewed - No data to display  EKG None  Radiology No results found.  Procedures Procedures (including critical care time)  Medications Ordered in UC Medications - No data to display  Initial Impression / Assessment and Plan / UC Course  I have reviewed the triage vital signs and the nursing notes.  Pertinent labs & imaging results that were available during my care of the patient  were reviewed by me and considered in my medical decision making (see chart for details).    I have told her that I reviewed Dr. Nino Parsley notes regarding his most recent visit on 08/15/2018.  Noted that any further revascularization of her left lower extremity will be involved surgery and seemed to be hoping to wait.  Is apparent now she is having rest claudication along with her ambulatory claudication.  Since she has gained relief mostly through the baclofen I have recommended that she take that now.  I also encouraged her to continue the walking program as Dr. had ordered her .  It is reassuring today that she does have Doppler pulses of the popliteal dorsalis pedis and posterior tibialis arteries. She will Contact Dr. Collene Leyden office tomorrow arrangements on follow-up and also possible CT scan..     Final Clinical Impressions(s) / UC Diagnoses   Final diagnoses:  Atherosclerotic peripheral vascular disease with intermittent claudication ([place])     Discharge Instructions     Increase baclofen to 10 mg 3 times daily for your pain.  Continue your walking program as tolerated.  Contact Dr. Lowella Fairy in the morning.  Today your Doppler pulses were present in your knee on down to your foot.    ED Prescriptions    None     Controlled Substance Prescriptions [place] Controlled Substance Registry consulted? Not Applicable   Lorin Picket, PA-C 09/04/18 [id number]

## 2018-09-05 ENCOUNTER — Other Ambulatory Visit (INDEPENDENT_AMBULATORY_CARE_PROVIDER_SITE_OTHER): Payer: Self-pay | Admitting: Nurse Practitioner

## 2018-09-05 NOTE — Addendum Note (Signed)
Addended by: Kris Hartmann on: 09/05/2018 03:10 PM   Modules accepted: Orders

## 2018-09-06 ENCOUNTER — Ambulatory Visit
Admission: RE | Admit: 2018-09-06 | Discharge: 2018-09-06 | Disposition: A | Discharge status: Home / Self Care | Payer: Medicare Other | Source: Ambulatory Visit | Attending: Nurse Practitioner | Admitting: Nurse Practitioner

## 2018-09-06 ENCOUNTER — Telehealth (INDEPENDENT_AMBULATORY_CARE_PROVIDER_SITE_OTHER): Payer: Self-pay

## 2018-09-06 DIAGNOSIS — M4180 Other forms of scoliosis, site unspecified: Secondary | ICD-10-CM | POA: Insufficient documentation

## 2018-09-06 DIAGNOSIS — N281 Cyst of kidney, acquired: Secondary | ICD-10-CM | POA: Insufficient documentation

## 2018-09-06 DIAGNOSIS — I700 Atherosclerosis of aorta: Secondary | ICD-10-CM | POA: Insufficient documentation

## 2018-09-06 DIAGNOSIS — I771 Stricture of artery: Secondary | ICD-10-CM

## 2018-09-06 DIAGNOSIS — K4090 Unilateral inguinal hernia, without obstruction or gangrene, not specified as recurrent: Secondary | ICD-10-CM | POA: Diagnosis not present

## 2018-09-06 DIAGNOSIS — J439 Emphysema, unspecified: Secondary | ICD-10-CM | POA: Insufficient documentation

## 2018-09-06 DIAGNOSIS — M5136 Other intervertebral disc degeneration, lumbar region: Secondary | ICD-10-CM | POA: Insufficient documentation

## 2018-09-06 MED ORDER — IOPAMIDOL (ISOVUE-370) INJECTION 76%
80.0000 mL | Freq: Once | INTRAVENOUS | Status: AC | PRN
  Administered 2018-09-06: 80 mL via INTRAVENOUS

## 2018-09-06 NOTE — Telephone Encounter (Signed)
Patient called to see if it's okay that she take all of her medications before the scheduled CT scan today at 2:30?  Are there any medication restrictions?

## 2018-09-06 NOTE — Telephone Encounter (Signed)
Called the patient to let her know what was advised.

## 2018-09-06 NOTE — Telephone Encounter (Signed)
The patient would need to call radiology in regard to her Metformin (Glucophage).  Usually there is a 24 to 48-hour waiting period after receiving contrast in which she would not be able to take this medication however she needs to call radiology and confirm their policy.  All her other medications are fine to take before her CT scan.

## 2018-09-07 ENCOUNTER — Other Ambulatory Visit: Payer: Self-pay

## 2018-09-07 MED ORDER — NITROGLYCERIN 0.4 MG SL SUBL: SUBLINGUAL_TABLET | SUBLINGUAL | 5 refills | Status: DC

## 2018-09-08 ENCOUNTER — Ambulatory Visit (INDEPENDENT_AMBULATORY_CARE_PROVIDER_SITE_OTHER): Payer: Medicare Other | Admitting: Vascular Surgery

## 2018-09-08 ENCOUNTER — Encounter (INDEPENDENT_AMBULATORY_CARE_PROVIDER_SITE_OTHER): Payer: Self-pay | Admitting: Vascular Surgery

## 2018-09-08 ENCOUNTER — Encounter (INDEPENDENT_AMBULATORY_CARE_PROVIDER_SITE_OTHER): Payer: Self-pay

## 2018-09-08 ENCOUNTER — Ambulatory Visit (INDEPENDENT_AMBULATORY_CARE_PROVIDER_SITE_OTHER): Payer: Medicare Other

## 2018-09-08 VITALS — BP 157/72 | HR 66 | Resp 16 | Ht 61.0 in | Wt 122.6 lb

## 2018-09-08 DIAGNOSIS — I10 Essential (primary) hypertension: Secondary | ICD-10-CM

## 2018-09-08 DIAGNOSIS — I2510 Atherosclerotic heart disease of native coronary artery without angina pectoris: Secondary | ICD-10-CM | POA: Diagnosis not present

## 2018-09-08 DIAGNOSIS — I6523 Occlusion and stenosis of bilateral carotid arteries: Secondary | ICD-10-CM | POA: Diagnosis not present

## 2018-09-08 DIAGNOSIS — I70222 Atherosclerosis of native arteries of extremities with rest pain, left leg: Secondary | ICD-10-CM

## 2018-09-08 DIAGNOSIS — Z87891 Personal history of nicotine dependence: Secondary | ICD-10-CM | POA: Diagnosis not present

## 2018-09-08 DIAGNOSIS — I739 Peripheral vascular disease, unspecified: Secondary | ICD-10-CM | POA: Diagnosis not present

## 2018-09-08 DIAGNOSIS — K219 Gastro-esophageal reflux disease without esophagitis: Secondary | ICD-10-CM

## 2018-09-08 NOTE — Progress Notes (Signed)
MRN : [id number]  Jill Hudson is a 83 y.o. (07-09-1935) female who presents with chief complaint of  Chief Complaint  Patient presents with  . Follow-up    ct results and abi results  .  History of Present Illness:   The patient returns to the office for followup and review of the noninvasive studies. There has been a significant deterioration in the lower extremity symptoms.  The patient notes interval shortening of their claudication distance and development of mild rest pain symptoms. No new ulcers or wounds have occurred since the last visit.  There have been no significant changes to the patient's overall health care.  The patient denies amaurosis fugax or recent TIA symptoms. There are no recent neurological changes noted. The patient denies history of DVT, PE or superficial thrombophlebitis. The patient denies recent episodes of angina or shortness of breath.   ABI's Rt=1.07 and Lt=0.93 (monophasic signals left foot)   Current Meds  Medication Sig  . acetaminophen (TYLENOL) 650 MG CR tablet Take 1,300 mg by mouth every 8 (eight) hours as needed for pain.  Marland Kitchen amLODipine (NORVASC) 2.5 MG tablet Take 2.5 mg by mouth daily.  Marland Kitchen aspirin 81 MG tablet Take 81 mg by mouth daily.   . B Complex Vitamins (VITAMIN-B COMPLEX) TABS Take 1 tablet by mouth daily.   . baclofen (LIORESAL) 10 MG tablet TAKE 1 TABLET(10 MG) BY MOUTH THREE TIMES DAILY (Patient taking differently: Take 10 mg by mouth at bedtime. )  . Biotin 10 MG TABS Take 10 mg by mouth daily.   . Calcium-Magnesium-Zinc 333-133-5 MG TABS Take by mouth.  . chlorthalidone (HYGROTON) 25 MG tablet Take 25 mg by mouth daily.  . Cholecalciferol (VITAMIN D3 PO) Take 1 capsule by mouth 2 (two) times daily.  . clopidogrel (PLAVIX) 75 MG tablet Take 1 tablet (75 mg total) by mouth daily.  . Cyanocobalamin (RA VITAMIN B-12 TR) 1000 MCG TBCR Take 1,000 mcg by mouth daily.   Marland Kitchen diltiazem (CARTIA XT) 180 MG 24 hr capsule Take 180 mg by  mouth 2 (two) times daily.   . insulin aspart (NOVOLOG FLEXPEN) 100 UNIT/ML FlexPen Inject 4 Units into the skin daily.   . Insulin Glargine (BASAGLAR KWIKPEN) 100 UNIT/ML SOPN Inject 12 Units into the skin daily.  Marland Kitchen loratadine (CLARITIN) 10 MG tablet Take 10 mg by mouth daily as needed for allergies.   Marland Kitchen losartan (COZAAR) 50 MG tablet Take 50 mg by mouth daily.   . metFORMIN (GLUCOPHAGE-XR) 500 MG 24 hr tablet TAKE ONE TABLET BY MOUTH TWICE DAILY (Patient taking differently: Take 500 mg by mouth 2 (two) times daily. )  . Multiple Vitamins-Minerals (MULTIVITAMIN PO) Take 1 tablet by mouth daily.  . Multiple Vitamins-Minerals (PRESERVISION AREDS PO) Take 1 capsule by mouth 2 (two) times daily.   . nitroGLYCERIN (NITROSTAT) 0.4 MG SL tablet 1 tab under the tongue for CP, may repeat in 5 minutes up to 3 doses  . rosuvastatin (CRESTOR) 5 MG tablet Take 5 mg by mouth See admin instructions. Take 5 mg by mouth daily on Monday, Wednesday, Friday and Saturday.    Past Medical History:  Diagnosis Date  . Anxiety   . Arthritis    spine and shoulder  . Cancer ([place])    skin  . Diabetes mellitus without complication ([place])   . Heart murmur   . Hyperlipidemia   . Hypertension   . Mitral and aortic valve disease   . Myocardial infarction ([place])  may have had a "light" heart attack  . Occasional tremors   . Shingles    patient unaware but daughter confirms. it was a long time ago  . Stroke [facility]) 01/2017   may have had a slight stroke  . TIA (transient ischemic attack) 01/2017  . UTI (urinary tract infection)   . Vascular disease, peripheral [facility])     Past Surgical History:  Procedure Laterality Date  . CARDIAC CATHETERIZATION  1998   40% LM, 95% Ramus interm  . CATARACT EXTRACTION, BILATERAL    . COLONOSCOPY WITH PROPOFOL N/A 08/12/2018   Procedure: COLONOSCOPY WITH PROPOFOL;  Surgeon: Lucilla Lame, MD;  Location: [facility] ENDOSCOPY;  Service: Endoscopy;  Laterality: N/A;  . EYE SURGERY  Bilateral    cataract extractions  . LOWER EXTREMITY ANGIOGRAPHY Left 08/23/2017   Procedure: Lower Extremity Angiography;  Surgeon: Algernon Huxley, MD;  Location: [place] CV LAB;  Service: Cardiovascular;  Laterality: Left;  . LOWER EXTREMITY ANGIOGRAPHY Left 07/26/2018   Procedure: LOWER EXTREMITY ANGIOGRAPHY;  Surgeon: Katha Cabal, MD;  Location: [place] [facility];  Service: Cardiovascular;  Laterality: Left;  . PTCA  08/2013   Left common iliac  . PTCA  12/2012   left ext iliac  . TUBAL LIGATION      Social History Social History   Tobacco Use  . Smoking status: Former Smoker    Packs/day: 2.00    Years: 37.00    Pack years: 74.00    Types: Cigarettes    Last attempt to quit: 1980    Years since quitting: 39.8  . Smokeless tobacco: Never Used  . Tobacco comment: smoking cessation materials not required  Substance Use Topics  . Alcohol use: No    Alcohol/week: 0.0 standard drinks  . Drug use: No    Family History Family History  Problem Relation Age of Onset  . Dementia Mother   . Diabetes Father     Allergies  Allergen Reactions  . Saxagliptin Diarrhea  . Epinephrine Other (See Comments)    Patient does not remember what happens when she uses this  . Atorvastatin Other (See Comments)    Muscle aches  . Codeine Other (See Comments)    Upset stomach  . Ezetimibe Other (See Comments)    Myalgias(ZETIA)  . Limonene Rash    Patient does not recall this reaction  . Nitrofurantoin Rash and Other (See Comments)    Pruitus  . Sulfa Antibiotics Rash and Other (See Comments)    Sore mouth      REVIEW OF SYSTEMS (Negative unless checked)  Constitutional: [] Weight loss  [] Fever  [] Chills Cardiac: [] Chest pain   [] Chest pressure   [] Palpitations   [] Shortness of breath when laying flat   [] Shortness of breath with exertion. Vascular:  [] Pain in legs with walking   [x] Pain in legs at rest  [] History of DVT   [] Phlebitis   [] Swelling in legs    [] Varicose veins   [] Non-healing ulcers Pulmonary:   [] Uses home oxygen   [] Productive cough   [] Hemoptysis   [] Wheeze  [] COPD   [] Asthma Neurologic:  [] Dizziness   [] Seizures   [] History of stroke   [] History of TIA  [] Aphasia   [] Vissual changes   [] Weakness or numbness in arm   [] Weakness or numbness in leg Musculoskeletal:   [] Joint swelling   [x] Joint pain   [x] Low back pain Hematologic:  [] Easy bruising  [] Easy bleeding   [] Hypercoagulable state   [] Anemic Gastrointestinal:  [] Diarrhea   []   Vomiting  [] Gastroesophageal reflux/heartburn   [] Difficulty swallowing. Genitourinary:  [] Chronic kidney disease   [] Difficult urination  [] Frequent urination   [] Blood in urine Skin:  [] Rashes   [] Ulcers  Psychological:  [] History of anxiety   []  History of major depression.  Physical Examination  Vitals:   09/08/18 1044  BP: (!) 157/72  Pulse: 66  Resp: 16  Weight: 122 lb 9.6 oz (55.6 kg)  Height: 5\' 1"  (1.549 m)   Body mass index is 23.17 kg/m. Gen: WD/WN, NAD Head: /AT, No temporalis wasting.  Ear/Nose/Throat: Hearing grossly intact, nares w/o erythema or drainage Eyes: PER, EOMI, sclera nonicteric.  Neck: Supple, no large masses.   Pulmonary:  Good air movement, no audible wheezing bilaterally, no use of accessory muscles.  Cardiac: RRR, no JVD Vascular:  Vessel Right Left  Radial Palpable Palpable  PT Trace Palpable Not Palpable  DP Trace Palpable Not Palpable  Gastrointestinal: Non-distended. No guarding/no peritoneal signs.  Musculoskeletal: M/S 5/5 throughout.  arthritic deformity multiple joints moderate  Atrophy left calf.  Neurologic: CN 2-12 intact. Symmetrical.  Speech is fluent. Motor exam as listed above. Psychiatric: Judgment intact, Mood & affect appropriate for pt's clinical situation. Dermatologic: No rashes or ulcers noted.  No changes consistent with cellulitis. Lymph : No lichenification or skin changes of chronic lymphedema.  CBC Lab Results  Component  Value Date   WBC 7.7 08/12/2018   HGB 8.9 (L) 08/23/2018   HCT 27.4 (L) 08/23/2018   MCV 91.2 08/12/2018   PLT 249 08/12/2018    BMET    Component Value Date/Time   NA 138 08/11/2018 1023   NA 136 01/16/2016 1135   NA 138 05/29/2014 1202   K 4.7 08/11/2018 1023   K 5.1 05/29/2014 1202   CL 107 08/11/2018 1023   CL 106 05/29/2014 1202   CO2 23 08/11/2018 1023   CO2 25 05/29/2014 1202   GLUCOSE 178 (H) 08/11/2018 1023   GLUCOSE 269 (H) 05/29/2014 1202   BUN 31 (H) 08/11/2018 1023   BUN 19 01/16/2016 1135   BUN 19 (H) 05/29/2014 1202   CREATININE 1.25 (H) 08/11/2018 1023   CREATININE 1.10 05/29/2014 1202   CALCIUM 9.2 08/11/2018 1023   CALCIUM 9.1 05/29/2014 1202   GFRNONAA 39 (L) 08/11/2018 1023   GFRNONAA 48 (L) 05/29/2014 1202   GFRAA 45 (L) 08/11/2018 1023   GFRAA 56 (L) 05/29/2014 1202   CrCl cannot be calculated (Patient's most recent lab result is older than the maximum 21 days allowed.).  COAG Lab Results  Component Value Date   INR 1.08 08/11/2018   INR 1.08 04/09/2017   INR 1.06 02/15/2017    Radiology Vas [place] Lower Extremity Saphenous Vein Mapping  Result Date: 08/18/2018 LOWER EXTREMITY VEIN MAPPING Indications:       PVD Other Indications: Pre-op evaluation  Performing Technologist: Blondell Reveal RT, RDMS, RVT  Examination Guidelines: A complete evaluation includes B-mode imaging, spectral Doppler, color Doppler, and power Doppler as needed of all accessible portions of each vessel. Bilateral testing is considered an integral part of a complete examination. Limited examinations for reoccurring indications may be performed as noted. +---------------+-----------+----------------------+---------------+-----------+   RT Diameter  RT Findings         GSV            LT Diameter  LT Findings      (cm)                                            (  cm)                  +---------------+-----------+----------------------+---------------+-----------+       0.44                     Saphenofemoral         0.43                                                   Junction                                  +---------------+-----------+----------------------+---------------+-----------+      0.29                     Proximal thigh         0.32                  +---------------+-----------+----------------------+---------------+-----------+      0.24                       Mid thigh            0.28                  +---------------+-----------+----------------------+---------------+-----------+      0.22                      Distal thigh          0.15                  +---------------+-----------+----------------------+---------------+-----------+      0.17                          Knee              0.14                  +---------------+-----------+----------------------+---------------+-----------+      0.17                       Prox calf            0.24                  +---------------+-----------+----------------------+---------------+-----------+      0.14                        Mid calf            0.10                  +---------------+-----------+----------------------+---------------+-----------+      0.14                      Distal calf           0.11                  +---------------+-----------+----------------------+---------------+-----------+ +-----------+--------------------+------------+----------+---------------------+ RT diameter    RT Findings         SSV         LT         LT Findings         (  cm)                                     Diameter                                                                     (cm)                         +-----------+--------------------+------------+----------+---------------------+    0.16    partially-occlusive  Popliteal     0.31    partially-occlusive               chronic thrombus     fossa                chronic thrombus     +-----------+--------------------+------------+----------+---------------------+    0.16                          Proximal     0.16    partially-occlusive                                     calf                chronic thrombus    +-----------+--------------------+------------+----------+---------------------+    0.17                          Mid calf     0.19                         +-----------+--------------------+------------+----------+---------------------+ Summary:  Right: The great saphenous vein appear patent and compressible.        Partially-occlusive chronic thrombus in the small saphenous        vein. Left: The great saphenous vein appear patent and compressible.       Partially-occlusive chronic thrombus in the small saphenous       vein.  *See table(s) above for measurements and observations. Diagnosing physician: Hortencia Pilar MD Electronically signed by Hortencia Pilar MD on 08/18/2018 at 6:23:01 PM.    Final    Ct Angio Abd/pel W/ And/or W/o  Result Date: 09/06/2018 CLINICAL DATA:  83 year old female with a several week history of left lower extremity and groin pain. EXAM: CT ANGIOGRAPHY ABDOMEN AND PELVIS WITH CONTRAST AND WITHOUT CONTRAST TECHNIQUE: Multidetector CT imaging of the abdomen and pelvis was performed using the standard protocol during bolus administration of intravenous contrast. Multiplanar reconstructed images and MIPs were obtained and reviewed to evaluate the vascular anatomy. CONTRAST:  31mL ISOVUE-370 IOPAMIDOL (ISOVUE-370) INJECTION 76% COMPARISON:  None. FINDINGS: VASCULAR Aorta: Extensive atherosclerotic calcifications throughout the abdominal aorta. No evidence of aneurysm or dissection. Celiac: Patent without evidence of aneurysm, dissection, vasculitis or significant stenosis. SMA: Bulky calcified atherosclerotic plaque at the origin of the superior mesenteric artery results in high-grade stenosis. The vessel opacifies distally and is not  occluded. Renals: Multiple renal arteries bilaterally. There are 2 renal arteries on the right, and 2 on the left.  Heterogeneous atherosclerotic plaque without significant stenosis in either of the 2 right renal arteries. On the left, heterogeneous atherosclerotic plaque results in a moderate focal stenosis of the dominant left renal artery. The smaller lower pole accessory renal artery is patent. IMA: Patent without evidence of aneurysm, dissection, vasculitis or significant stenosis. Inflow: Heavily diseased iliac arteries with bulky calcified plaque bilaterally. Left common iliac stent, right external iliac stent and left external iliac into common femoral arterial stent are all widely patent. Both internal iliac arteries are patent. Proximal Outflow: The left superficial femoral artery occludes shortly beyond the origin. In incompletely imaged stent in the proximal SFA is also occluded. The profunda femoral artery remains patent. On the right, the visualized runoff arteries are patent. Veins: No focal venous abnormality. Review of the MIP images confirms the above findings. NON-VASCULAR Lower chest: The visualized heart is normal in size. No pericardial effusion. Unremarkable visualized distal thoracic esophagus. A 9 mm ground-glass attenuation nodular opacity is present in the periphery of the right lower lobe (image 45 series 8). There is a background of mild centrilobular pulmonary emphysema. Hepatobiliary: Normal hepatic contour and morphology. Several circumscribed nonenhancing water attenuation lesions are present throughout the liver most consistent with simple cysts. Some of the abnormalities are too small to characterize but not overtly suspicious. No intra or extrahepatic biliary ductal dilatation. Pancreas: Unremarkable. No pancreatic ductal dilatation or surrounding inflammatory changes. Spleen: Normal in size without focal abnormality. Adrenals/Urinary Tract: The adrenal glands are normal. There are  several circumscribed water attenuation cystic lesions in both kidneys. No evidence of enhancement. Findings are most consistent with simple renal cysts. A solitary cyst in the upper pole of the left kidney demonstrates thin internal septations consistent with minimal complexity. No evidence of hydronephrosis or nephrolithiasis. Stomach/Bowel: Colonic diverticular disease without CT evidence of active inflammation. No evidence of obstruction or focal bowel wall thickening. Normal appendix in the right lower quadrant. The terminal ileum is unremarkable. Lymphatic: No suspicious lymphadenopathy. Reproductive: Uterus and bilateral adnexa are unremarkable. Other: Small fat containing right inguinal hernia. No evidence of ascites. Musculoskeletal: No acute fracture or aggressive appearing lytic or blastic osseous lesion. Multilevel degenerative disc disease. Mild levoconvex scoliosis. IMPRESSION: VASCULAR 1. Left superficial femoral artery occlusion of undetermined chronicity. 2. Extensive calcified atherosclerotic plaque throughout the iliac arteries. Bilateral previously placed stents all remain patent without evidence of significant InStent stenosis or occlusion. 3.  Aortic Atherosclerosis (ICD10-170.0) 4. High-grade stenosis in the proximal superior mesenteric artery secondary to heavily calcified atherosclerotic plaque. 5. Focal moderate to high-grade stenosis at the origin of the left main renal artery. NON-VASCULAR 1. Concerning 0.9 cm ground-glass attenuation airspace opacity in the right lower lobe. Differential considerations include a focal infectious/inflammatory nodule versus a low grade adenocarcinoma or adenocarcinoma in situ. Initial follow-up with CT at 6-12 months is recommended to confirm persistence. If persistent, repeat CT is recommended every 2 years until 5 years of stability has been established. This recommendation follows the consensus statement: Guidelines for Management of Incidental  Pulmonary Nodules Detected on CT Images:From the Fleischner Society 2017; published online before print (10.1148/radiol.[id number]). 2. Mild centrilobular pulmonary emphysema. 3. Hepatic and renal cysts. 4. Small fat containing right inguinal hernia. 5. Mild levoconvex scoliosis, likely degenerative in nature. 6. Multilevel degenerative disc disease. These results will be called to the ordering clinician or representative by the Radiologist Assistant, and communication documented in the PACS or zVision Dashboard. Signed, Criselda Peaches, MD, [facility] Electronically  Signed   By: Jacqulynn Cadet M.D.   On: 09/06/2018 15:33     Assessment/Plan 1. Atherosclerosis of native artery of left lower extremity with rest pain (HCC) Recommend:  The patient has evidence of severe atherosclerotic changes of both lower extremities with rest pain that is associated with preulcerative changes and impending tissue loss of the left foot.  This represents a limb threatening ischemia and places the patient at the risk for limb loss.  She does not have any vein to use as a bypass.  I will plan to access the Pedal artery to allow for crossing of the SFA occlusion.  Patient should undergo angiography of the left lower extremity with the hope for intervention for limb salvage.  The risks and benefits as well as the alternative therapies was discussed in detail with the patient.  All questions were answered.  Patient agrees to proceed with left leg angiography.  The patient will follow up with me in the office after the procedure.    A total of 30 minutes was spent with this patient and greater than 50% was spent in counseling and coordination of care with the patient.  Discussion included the treatment options for vascular disease including indications for surgery and intervention.  Also discussed is the appropriate timing of treatment.  In addition  medical therapy was discussed.  2. Bilateral carotid artery stenosis Recommend:  Given the patient's asymptomatic subcritical stenosis no further invasive testing or surgery at this time.  Continue antiplatelet therapy as prescribed Continue management of CAD, HTN and Hyperlipidemia Healthy heart diet,  encouraged exercise at least 4 times per week Follow up in  6 months with duplex ultrasound and physical exam   3. Essential (primary) hypertension Continue antihypertensive medications as already ordered, these medications have been reviewed and there are no changes at this time.   4. Arteriosclerosis of coronary artery Continue cardiac and antihypertensive medications as already ordered and reviewed, no changes at this time.  Continue statin as ordered and reviewed, no changes at this time  Nitrates PRN for chest pain   5. Gastroesophageal reflux disease without esophagitis Continue PPI as already ordered, this medication has been reviewed and there are no changes at this time.  Avoidence of caffeine and alcohol  Moderate elevation of the head of the bed    Hortencia Pilar, MD  09/08/2018 11:26 AM

## 2018-09-14 ENCOUNTER — Other Ambulatory Visit (INDEPENDENT_AMBULATORY_CARE_PROVIDER_SITE_OTHER): Payer: Self-pay | Admitting: Nurse Practitioner

## 2018-09-15 ENCOUNTER — Encounter (INDEPENDENT_AMBULATORY_CARE_PROVIDER_SITE_OTHER): Payer: Medicare Other

## 2018-09-15 ENCOUNTER — Ambulatory Visit (INDEPENDENT_AMBULATORY_CARE_PROVIDER_SITE_OTHER): Payer: Medicare Other | Admitting: Vascular Surgery

## 2018-09-15 ENCOUNTER — Encounter
Admission: RE | Admit: 2018-09-15 | Discharge: 2018-09-15 | Disposition: A | Discharge status: Home / Self Care | Payer: Medicare Other | Source: Ambulatory Visit | Attending: Vascular Surgery | Admitting: Vascular Surgery

## 2018-09-15 DIAGNOSIS — E785 Hyperlipidemia, unspecified: Secondary | ICD-10-CM | POA: Diagnosis not present

## 2018-09-15 DIAGNOSIS — Z9889 Other specified postprocedural states: Secondary | ICD-10-CM | POA: Diagnosis not present

## 2018-09-15 DIAGNOSIS — Z8673 Personal history of transient ischemic attack (TIA), and cerebral infarction without residual deficits: Secondary | ICD-10-CM | POA: Diagnosis not present

## 2018-09-15 DIAGNOSIS — Z87440 Personal history of urinary (tract) infections: Secondary | ICD-10-CM | POA: Diagnosis not present

## 2018-09-15 DIAGNOSIS — E119 Type 2 diabetes mellitus without complications: Secondary | ICD-10-CM | POA: Diagnosis not present

## 2018-09-15 DIAGNOSIS — Z888 Allergy status to other drugs, medicaments and biological substances status: Secondary | ICD-10-CM | POA: Diagnosis not present

## 2018-09-15 DIAGNOSIS — I10 Essential (primary) hypertension: Secondary | ICD-10-CM | POA: Diagnosis not present

## 2018-09-15 DIAGNOSIS — Z9851 Tubal ligation status: Secondary | ICD-10-CM | POA: Diagnosis not present

## 2018-09-15 DIAGNOSIS — Z01812 Encounter for preprocedural laboratory examination: Secondary | ICD-10-CM

## 2018-09-15 DIAGNOSIS — Z955 Presence of coronary angioplasty implant and graft: Secondary | ICD-10-CM | POA: Diagnosis not present

## 2018-09-15 DIAGNOSIS — Z9841 Cataract extraction status, right eye: Secondary | ICD-10-CM | POA: Diagnosis not present

## 2018-09-15 DIAGNOSIS — I70222 Atherosclerosis of native arteries of extremities with rest pain, left leg: Secondary | ICD-10-CM | POA: Diagnosis not present

## 2018-09-15 DIAGNOSIS — Z833 Family history of diabetes mellitus: Secondary | ICD-10-CM | POA: Diagnosis not present

## 2018-09-15 DIAGNOSIS — Z882 Allergy status to sulfonamides status: Secondary | ICD-10-CM | POA: Diagnosis not present

## 2018-09-15 DIAGNOSIS — Z885 Allergy status to narcotic agent status: Secondary | ICD-10-CM | POA: Diagnosis not present

## 2018-09-15 DIAGNOSIS — Z87891 Personal history of nicotine dependence: Secondary | ICD-10-CM | POA: Diagnosis not present

## 2018-09-15 DIAGNOSIS — Z8619 Personal history of other infectious and parasitic diseases: Secondary | ICD-10-CM | POA: Diagnosis not present

## 2018-09-15 DIAGNOSIS — I6523 Occlusion and stenosis of bilateral carotid arteries: Secondary | ICD-10-CM | POA: Diagnosis not present

## 2018-09-15 DIAGNOSIS — Z9842 Cataract extraction status, left eye: Secondary | ICD-10-CM | POA: Diagnosis not present

## 2018-09-15 DIAGNOSIS — M1990 Unspecified osteoarthritis, unspecified site: Secondary | ICD-10-CM | POA: Diagnosis not present

## 2018-09-15 DIAGNOSIS — I252 Old myocardial infarction: Secondary | ICD-10-CM | POA: Diagnosis not present

## 2018-09-15 LAB — CREATININE, SERUM
CREATININE: 1.25 mg/dL — AB (ref 0.44–1.00)
GFR, EST AFRICAN AMERICAN: 45 mL/min — AB (ref >60)
GFR, EST NON AFRICAN AMERICAN: 39 mL/min — AB (ref >60)

## 2018-09-15 LAB — BUN: BUN: 30 mg/dL — ABNORMAL HIGH (ref 8–23)

## 2018-09-16 ENCOUNTER — Other Ambulatory Visit: Payer: Self-pay

## 2018-09-16 ENCOUNTER — Ambulatory Visit
Admission: RE | Admit: 2018-09-16 | Discharge: 2018-09-16 | Disposition: A | Discharge status: Home / Self Care | Payer: Medicare Other | Source: Ambulatory Visit | Attending: Vascular Surgery | Admitting: Vascular Surgery

## 2018-09-16 ENCOUNTER — Encounter
Admission: RE | Disposition: A | Discharge status: Home / Self Care | Payer: Self-pay | Source: Ambulatory Visit | Attending: Vascular Surgery

## 2018-09-16 DIAGNOSIS — M1990 Unspecified osteoarthritis, unspecified site: Secondary | ICD-10-CM | POA: Insufficient documentation

## 2018-09-16 DIAGNOSIS — Z9889 Other specified postprocedural states: Secondary | ICD-10-CM | POA: Insufficient documentation

## 2018-09-16 DIAGNOSIS — I10 Essential (primary) hypertension: Secondary | ICD-10-CM | POA: Diagnosis not present

## 2018-09-16 DIAGNOSIS — E119 Type 2 diabetes mellitus without complications: Secondary | ICD-10-CM | POA: Insufficient documentation

## 2018-09-16 DIAGNOSIS — I252 Old myocardial infarction: Secondary | ICD-10-CM | POA: Insufficient documentation

## 2018-09-16 DIAGNOSIS — Z8619 Personal history of other infectious and parasitic diseases: Secondary | ICD-10-CM | POA: Insufficient documentation

## 2018-09-16 DIAGNOSIS — I70219 Atherosclerosis of native arteries of extremities with intermittent claudication, unspecified extremity: Secondary | ICD-10-CM

## 2018-09-16 DIAGNOSIS — Z888 Allergy status to other drugs, medicaments and biological substances status: Secondary | ICD-10-CM | POA: Insufficient documentation

## 2018-09-16 DIAGNOSIS — Z882 Allergy status to sulfonamides status: Secondary | ICD-10-CM | POA: Insufficient documentation

## 2018-09-16 DIAGNOSIS — E785 Hyperlipidemia, unspecified: Secondary | ICD-10-CM | POA: Insufficient documentation

## 2018-09-16 DIAGNOSIS — I6523 Occlusion and stenosis of bilateral carotid arteries: Secondary | ICD-10-CM | POA: Insufficient documentation

## 2018-09-16 DIAGNOSIS — Z8673 Personal history of transient ischemic attack (TIA), and cerebral infarction without residual deficits: Secondary | ICD-10-CM | POA: Insufficient documentation

## 2018-09-16 DIAGNOSIS — I70222 Atherosclerosis of native arteries of extremities with rest pain, left leg: Secondary | ICD-10-CM | POA: Insufficient documentation

## 2018-09-16 DIAGNOSIS — Z9851 Tubal ligation status: Secondary | ICD-10-CM | POA: Insufficient documentation

## 2018-09-16 DIAGNOSIS — Z9841 Cataract extraction status, right eye: Secondary | ICD-10-CM | POA: Insufficient documentation

## 2018-09-16 DIAGNOSIS — Z87891 Personal history of nicotine dependence: Secondary | ICD-10-CM | POA: Insufficient documentation

## 2018-09-16 DIAGNOSIS — Z9842 Cataract extraction status, left eye: Secondary | ICD-10-CM | POA: Insufficient documentation

## 2018-09-16 DIAGNOSIS — Z955 Presence of coronary angioplasty implant and graft: Secondary | ICD-10-CM | POA: Insufficient documentation

## 2018-09-16 DIAGNOSIS — Z885 Allergy status to narcotic agent status: Secondary | ICD-10-CM | POA: Insufficient documentation

## 2018-09-16 DIAGNOSIS — Z833 Family history of diabetes mellitus: Secondary | ICD-10-CM | POA: Insufficient documentation

## 2018-09-16 DIAGNOSIS — Z87440 Personal history of urinary (tract) infections: Secondary | ICD-10-CM | POA: Insufficient documentation

## 2018-09-16 HISTORY — PX: LOWER EXTREMITY ANGIOGRAPHY: CATH118251

## 2018-09-16 LAB — GLUCOSE, CAPILLARY
GLUCOSE-CAPILLARY: 140 mg/dL — AB (ref 70–99)
Glucose-Capillary: 134 mg/dL — ABNORMAL HIGH (ref 70–99)

## 2018-09-16 SURGERY — LOWER EXTREMITY ANGIOGRAPHY
Anesthesia: Moderate Sedation | Laterality: Left

## 2018-09-16 MED ORDER — LIDOCAINE HCL (PF) 1 % IJ SOLN
INTRAMUSCULAR | Status: AC
  Filled 2018-09-16: qty 30

## 2018-09-16 MED ORDER — FENTANYL CITRATE (PF) 100 MCG/2ML IJ SOLN
INTRAMUSCULAR | Status: DC | PRN
  Administered 2018-09-16 (×4): 50 ug via INTRAVENOUS

## 2018-09-16 MED ORDER — HYDRALAZINE HCL 20 MG/ML IJ SOLN: 5.0000 mg | INTRAMUSCULAR | Status: DC | PRN

## 2018-09-16 MED ORDER — DEXTROSE 5 % IV SOLN
2.0000 g | Freq: Once | INTRAVENOUS | Status: AC
  Administered 2018-09-16: 2 g via INTRAVENOUS
  Filled 2018-09-16: qty 20

## 2018-09-16 MED ORDER — ACETAMINOPHEN 325 MG PO TABS: 650.0000 mg | ORAL_TABLET | ORAL | Status: DC | PRN

## 2018-09-16 MED ORDER — HYDROMORPHONE HCL 1 MG/ML IJ SOLN
1.0000 mg | Freq: Once | INTRAMUSCULAR | Status: AC | PRN
  Administered 2018-09-16: 0.5 mg via INTRAVENOUS

## 2018-09-16 MED ORDER — OXYCODONE-ACETAMINOPHEN 5-325 MG PO TABS: 1.0000 | ORAL_TABLET | Freq: Four times a day (QID) | ORAL | 0 refills | Status: DC | PRN

## 2018-09-16 MED ORDER — SODIUM CHLORIDE 0.9% FLUSH: 3.0000 mL | INTRAVENOUS | Status: DC | PRN

## 2018-09-16 MED ORDER — MIDAZOLAM HCL 2 MG/2ML IJ SOLN
INTRAMUSCULAR | Status: AC
  Filled 2018-09-16: qty 4

## 2018-09-16 MED ORDER — FENTANYL CITRATE (PF) 100 MCG/2ML IJ SOLN
INTRAMUSCULAR | Status: AC
  Filled 2018-09-16: qty 2

## 2018-09-16 MED ORDER — MIDAZOLAM HCL 2 MG/2ML IJ SOLN
INTRAMUSCULAR | Status: DC | PRN
  Administered 2018-09-16 (×4): 1 mg via INTRAVENOUS

## 2018-09-16 MED ORDER — SODIUM CHLORIDE 0.9% FLUSH: 3.0000 mL | Freq: Two times a day (BID) | INTRAVENOUS | Status: DC

## 2018-09-16 MED ORDER — SODIUM CHLORIDE 0.9 % IV SOLN: INTRAVENOUS | Status: AC

## 2018-09-16 MED ORDER — IOPAMIDOL (ISOVUE-300) INJECTION 61%
INTRAVENOUS | Status: DC | PRN
  Administered 2018-09-16: 45 mL via INTRAVENOUS

## 2018-09-16 MED ORDER — ONDANSETRON HCL 4 MG/2ML IJ SOLN: 4.0000 mg | Freq: Four times a day (QID) | INTRAMUSCULAR | Status: DC | PRN

## 2018-09-16 MED ORDER — SODIUM CHLORIDE 0.9 % IV SOLN: INTRAVENOUS | Status: DC

## 2018-09-16 MED ORDER — SODIUM CHLORIDE 0.9 % IV SOLN: 250.0000 mL | INTRAVENOUS | Status: DC | PRN

## 2018-09-16 MED ORDER — CEFAZOLIN SODIUM-DEXTROSE 2-4 GM/100ML-% IV SOLN
INTRAVENOUS | Status: AC
  Filled 2018-09-16: qty 100

## 2018-09-16 MED ORDER — OXYCODONE HCL 5 MG PO TABS: 5.0000 mg | ORAL_TABLET | ORAL | Status: DC | PRN

## 2018-09-16 MED ORDER — HEPARIN (PORCINE) IN NACL 1000-0.9 UT/500ML-% IV SOLN
INTRAVENOUS | Status: AC
  Filled 2018-09-16: qty 1000

## 2018-09-16 MED ORDER — SODIUM CHLORIDE 0.9 % IV SOLN
INTRAVENOUS | Status: DC
  Administered 2018-09-16: 150 mL/h via INTRAVENOUS

## 2018-09-16 MED ORDER — LIDOCAINE HCL (CARDIAC) PF 100 MG/5ML IV SOSY
PREFILLED_SYRINGE | INTRAVENOUS | Status: AC
  Filled 2018-09-16: qty 5

## 2018-09-16 MED ORDER — HYDROMORPHONE HCL 1 MG/ML IJ SOLN
INTRAMUSCULAR | Status: AC
  Filled 2018-09-16: qty 0.5

## 2018-09-16 MED ORDER — HEPARIN SODIUM (PORCINE) 1000 UNIT/ML IJ SOLN
INTRAMUSCULAR | Status: AC
  Filled 2018-09-16: qty 1

## 2018-09-16 MED ORDER — LABETALOL HCL 5 MG/ML IV SOLN: 10.0000 mg | INTRAVENOUS | Status: DC | PRN

## 2018-09-16 MED ORDER — HEPARIN SODIUM (PORCINE) 1000 UNIT/ML IJ SOLN
INTRAMUSCULAR | Status: DC | PRN
  Administered 2018-09-16: 3000 [IU] via INTRAVENOUS
  Administered 2018-09-16: 2000 [IU] via INTRAVENOUS

## 2018-09-16 MED ORDER — MORPHINE SULFATE (PF) 4 MG/ML IV SOLN: 2.0000 mg | INTRAVENOUS | Status: DC | PRN

## 2018-09-16 SURGICAL SUPPLY — 30 items
BALLN ULTRV 018 3X40X75 (BALLOONS) ×2
BALLN ULTRVRSE 3X40X75C (BALLOONS) ×2
BALLOON ULTRV 018 3X40X75 (BALLOONS) ×1 IMPLANT
BALLOON ULTRVRSE 3X40X75C (BALLOONS) ×1 IMPLANT
CATH BEACON 5 .035 65 KMP TIP (CATHETERS) ×2 IMPLANT
CATH BEACON 5 .035 65 RIM TIP (CATHETERS) ×2 IMPLANT
CATH CXI 4F 90 DAV (CATHETERS) ×2 IMPLANT
CATH PIG 70CM (CATHETERS) ×2 IMPLANT
CATH SEEKER .035X135CM (CATHETERS) ×2 IMPLANT
COVER PROBE U/S 5X48 (MISCELLANEOUS) ×2 IMPLANT
DEVICE PRESTO INFLATION (MISCELLANEOUS) ×2 IMPLANT
DEVICE SAFEGUARD 24CM (GAUZE/BANDAGES/DRESSINGS) ×2 IMPLANT
DEVICE STARCLOSE SE CLOSURE (Vascular Products) ×2 IMPLANT
DEVICE TORQUE .025-.038 (MISCELLANEOUS) ×2 IMPLANT
DRAPE BRACHIAL (DRAPES) ×2 IMPLANT
GLIDECATH 4FR STR (CATHETERS) ×2 IMPLANT
GLIDEWIRE ADV .035X260CM (WIRE) ×2 IMPLANT
GLIDEWIRE ANGLED SS 035X260CM (WIRE) ×2 IMPLANT
NEEDLE ENTRY 21GA 7CM ECHOTIP (NEEDLE) ×2 IMPLANT
PACK ANGIOGRAPHY (CUSTOM PROCEDURE TRAY) ×2 IMPLANT
SET INTRO CAPELLA COAXIAL (SET/KITS/TRAYS/PACK) ×2 IMPLANT
SHEATH ANL2 6FRX45 HC (SHEATH) ×2 IMPLANT
SHEATH BRITE TIP 5FRX11 (SHEATH) ×2 IMPLANT
SHEATH GLIDE SLENDER 4/5FR (SHEATH) ×2 IMPLANT
SHEATH MICROPUNCTURE PEDAL 4FR (SHEATH) ×2 IMPLANT
TOWEL OR 17X26 4PK STRL BLUE (TOWEL DISPOSABLE) ×2 IMPLANT
TUBING CONTRAST HIGH PRESS 72 (TUBING) ×4 IMPLANT
WIRE AQUATRACK .035X260CM (WIRE) ×2 IMPLANT
WIRE G 018X200 V18 (WIRE) ×2 IMPLANT
WIRE J 3MM .035X145CM (WIRE) ×2 IMPLANT

## 2018-09-16 NOTE — Op Note (Signed)
[facility] History & Physical Update  The patient was interviewed and re-examined.  The patient's previous History and Physical has been reviewed and is unchanged.  There is no change in the plan of care. We plan to proceed with the scheduled procedure.  Hortencia Pilar, MD  09/16/2018, 10:24 AM

## 2018-09-16 NOTE — Progress Notes (Signed)
Spoke with Dr. [name] in person re: pt. Right groin status. PAD intact with scanty blood noted . Pulses present bilat. LE. Clear yellow UOP. No new orders at present.

## 2018-09-16 NOTE — Progress Notes (Signed)
Called and spoke with pt., daughter, son (neurologist), and local pharmacy. No party aware of any reaction to Epi. Dr. [name] made aware.  MD at bedside now to place suture to RFA site.Site prepped with Chlorhexadine, 1% Lidocaine SQ, a stitch, then Dermabond applied to site by MD. Pt. Tolerated well.

## 2018-09-16 NOTE — Progress Notes (Signed)
Butterfly drsg. Removed from Left ankle area-immediate ooze. Manual pressure held x 10 min.by Arnette Schaumann, RN. PAD deflated- immediatel "track" ooze. Manual pressure held by A. Sharlett Iles, RN. Dr. [name] at bedside now & aware of situation . Orders received to hold pressure x 20 min., apply Butterfly drsg. & tegaderm. Pt. Tolerating pressure well.

## 2018-09-16 NOTE — Progress Notes (Signed)
PAD site continued to ooze slow bright red blood. PAD removed. Manual pressure held x 20 min. Pt. Med. With 0.5 mg Dilaudid slow IVP for c/o pain "8"/10 to left lower leg. New PAD applied with 50 ml air. Pt. Tolerated well. In & out cath done with 500 ml yellow UOP.

## 2018-09-16 NOTE — Progress Notes (Signed)
Dr. [name] at bedside: spoke with pt. Re: results. Of procedure. MD also assessed right groin. No new orders at present.

## 2018-09-16 NOTE — Op Note (Signed)
[facility]  Percutaneous Study/Intervention Procedural Note   Date of Surgery: 09/16/2018,12:31 PM  Surgeon:Schnier, Dolores Lory   Pre-operative Diagnosis: Atherosclerotic occlusive disease bilateral lower extremities with rest pain of the left lower extremity  Post-operative diagnosis:  Same  Procedure(s) Performed:  1.  Introduction catheter left common femoral artery right groin approach  2.  Introduction catheter left SFA left dorsalis pedis approach  3.  Left lower extremity angiography  4.  Start closed right common femoral    Anesthesia: Conscious sedation was administered by the interventional radiology RN under my direct supervision. IV Versed plus fentanyl were utilized. Continuous ECG, pulse oximetry and blood pressure was monitored throughout the entire procedure.  Conscious sedation was administered for a total of 1 hour 38 minutes.  Sheath: 5 French slim sheath left dorsalis pedis artery retrograde 6 [place] Ansell right common femoral antegrade  Contrast: 45 cc   Fluoroscopy Time: 21.5 minutes  Indications:  The patient presents to [facility] with ischemic rest pain of the left foot and heel.  Pedal pulses are nonpalpable bilaterally suggesting atherosclerotic occlusive disease.  The risks and benefits as well as alternative therapies for lower extremity revascularization are reviewed with the patient all questions are answered the patient agrees to proceed.  The patient is therefore undergoing angiography with the hope for intervention for limb salvage.   Procedure:  Jill Hudson a 83 y.o. female who was identified and appropriate procedural time out was performed.  The patient was then placed supine on the table and prepped and draped in the usual sterile fashion.  Ultrasound was used to evaluate the left dorsalis pedis artery.  It was echolucent and minimally pulsatile indicating it is patent .  An ultrasound image was acquired for the  permanent record.  A micropuncture needle was used to access the left dorsalis pedis artery under direct ultrasound guidance.  The microwire was then advanced under fluoroscopic guidance without difficulty followed by the micro-sheath.  A 0.025 wire was advanced without resistance and a 5Fr slim sheath was placed.  Hand-injection contrast was then used to demonstrate the anterior tibial and trifurcation anatomy.  Working with a variety of wires and catheters the wire catheter combination was negotiated up to the cul-de-sac of the SFA proximally.  Reentry was not achievable at this time.  Therefore the ultrasound was reprepped and redraped in a sterile fashion the right common femoral artery was evaluated it was noted to be echolucent and pulsatile indicating patency.  Image was recorded for the permanent record.  Under direct ultrasound visualization a micro needle was inserted into the common femoral artery microwire followed micro-sheath J-wire followed by a 5 French sheath.  Rim catheter and a stiff aqua track wire was then used to cross the bifurcation and was negotiated down into the profunda femoris.  5 French sheath was removed and a 6 [place] Ansell advanced up and over and positioned with its tip directly within the cul-de-sac of the SFA just above the occlusion.  This was accomplished by hand-injection contrast to demonstrate the common femoral anatomy and the femoral bifurcation from injecting through the [facility] sheath.  At this point using a variety of techniques with a combination of catheters and wires from both above and below (including at one point using a 3 mm balloon to angioplasty the proximal SFA to try to create a fracture plane for reentry) was performed.  Unfortunately reentry could not be achieved.  At one point injection through the  CXI catheter was performed but did show that it reentry to the true lumen had not been achieved.  Number after review of the images the catheter was  removed over wire and an RAO view of the groin was obtained. StarClose device was deployed without difficulty.  The sheath in the dorsalis pedis was pulled and pressure was held   Findings:   Left Lower Extremity: The external iliac common femoral and profunda femoris are widely patent.  There is a 1 cm cul-de-sac of the SFA which then occludes in the previously placed stents are occluded.  At the at knee popliteal is reconstituted there is patency of the trifurcation with patency of the anterior tibial down to the foot.  As noted above reentry could not be achieved and therefore intervention was not performed.   Disposition: Patient was taken to the recovery room in stable condition having tolerated the procedure well.  Belenda Cruise Schnier 09/16/2018,12:31 PM

## 2018-09-19 ENCOUNTER — Ambulatory Visit (INDEPENDENT_AMBULATORY_CARE_PROVIDER_SITE_OTHER): Payer: Medicare Other | Admitting: Vascular Surgery

## 2018-09-19 ENCOUNTER — Encounter: Payer: Self-pay | Admitting: Vascular Surgery

## 2018-09-19 VITALS — BP 139/51 | HR 65 | Resp 18 | Ht 63.0 in | Wt 127.0 lb

## 2018-09-19 DIAGNOSIS — Z87891 Personal history of nicotine dependence: Secondary | ICD-10-CM | POA: Diagnosis not present

## 2018-09-19 DIAGNOSIS — K219 Gastro-esophageal reflux disease without esophagitis: Secondary | ICD-10-CM

## 2018-09-19 DIAGNOSIS — E1165 Type 2 diabetes mellitus with hyperglycemia: Secondary | ICD-10-CM | POA: Diagnosis not present

## 2018-09-19 DIAGNOSIS — I2510 Atherosclerotic heart disease of native coronary artery without angina pectoris: Secondary | ICD-10-CM

## 2018-09-19 DIAGNOSIS — Z794 Long term (current) use of insulin: Secondary | ICD-10-CM | POA: Diagnosis not present

## 2018-09-19 DIAGNOSIS — I119 Hypertensive heart disease without heart failure: Secondary | ICD-10-CM

## 2018-09-19 DIAGNOSIS — I6523 Occlusion and stenosis of bilateral carotid arteries: Secondary | ICD-10-CM

## 2018-09-19 DIAGNOSIS — I70222 Atherosclerosis of native arteries of extremities with rest pain, left leg: Secondary | ICD-10-CM

## 2018-09-19 DIAGNOSIS — E1142 Type 2 diabetes mellitus with diabetic polyneuropathy: Secondary | ICD-10-CM

## 2018-09-19 DIAGNOSIS — IMO0002 Reserved for concepts with insufficient information to code with codable children: Secondary | ICD-10-CM

## 2018-09-20 ENCOUNTER — Encounter (INDEPENDENT_AMBULATORY_CARE_PROVIDER_SITE_OTHER): Payer: Self-pay | Admitting: Vascular Surgery

## 2018-09-20 DIAGNOSIS — H353211 Exudative age-related macular degeneration, right eye, with active choroidal neovascularization: Secondary | ICD-10-CM | POA: Diagnosis not present

## 2018-09-20 NOTE — Progress Notes (Signed)
MRN : [id number]  Jill Hudson is a 83 y.o. (1935/05/22) female who presents with chief complaint of  Chief Complaint  Patient presents with  . Follow-up    Discuss surgery  .  History of Present Illness:   The patient returns to the office for followup and review of the her angiogram images from last week. There has been a significant deterioration in the lower extremity symptoms.  The patient notes interval shortening of their claudication distance and development of mild rest pain symptoms. No new ulcers or wounds have occurred since the last visit.  There have been no significant changes to the patient's overall health care.  The patient denies amaurosis fugax or recent TIA symptoms. There are no recent neurological changes noted. The patient denies history of DVT, PE or superficial thrombophlebitis. The patient denies recent episodes of angina or shortness of breath.     Current Meds  Medication Sig  . acetaminophen (TYLENOL) 650 MG CR tablet Take 1,300 mg by mouth every 8 (eight) hours as needed for pain.  Marland Kitchen amLODipine (NORVASC) 2.5 MG tablet Take 2.5 mg by mouth daily.  Marland Kitchen aspirin 81 MG tablet Take 81 mg by mouth daily.   . B Complex Vitamins (VITAMIN-B COMPLEX) TABS Take 1 tablet by mouth daily.   . baclofen (LIORESAL) 10 MG tablet TAKE 1 TABLET(10 MG) BY MOUTH THREE TIMES DAILY (Patient taking differently: Take 10 mg by mouth at bedtime. )  . Biotin 10 MG TABS Take 10 mg by mouth daily.   Marland Kitchen CALCIUM-MAGNESIUM-ZINC PO Take 1 tablet by mouth daily.   . chlorthalidone (HYGROTON) 25 MG tablet Take 25 mg by mouth daily.  . clopidogrel (PLAVIX) 75 MG tablet Take 1 tablet (75 mg total) by mouth daily.  . Cyanocobalamin (RA VITAMIN B-12 TR) 1000 MCG TBCR Take 1,000 mcg by mouth daily.   Marland Kitchen diltiazem (CARTIA XT) 180 MG 24 hr capsule Take 180 mg by mouth 2 (two) times daily.   . ferrous sulfate 325 (65 FE) MG tablet Take 325 mg by mouth daily with breakfast.  . insulin aspart  (NOVOLOG FLEXPEN) 100 UNIT/ML FlexPen Inject 4 Units into the skin daily after lunch.   . Insulin Glargine (BASAGLAR KWIKPEN) 100 UNIT/ML SOPN Inject 12 Units into the skin daily.  Marland Kitchen loratadine (CLARITIN) 10 MG tablet Take 10 mg by mouth daily as needed for allergies.   Marland Kitchen losartan (COZAAR) 50 MG tablet Take 50 mg by mouth 2 (two) times daily.   . metFORMIN (GLUCOPHAGE-XR) 500 MG 24 hr tablet TAKE ONE TABLET BY MOUTH TWICE DAILY (Patient taking differently: Take 500 mg by mouth 2 (two) times daily. )  . Multiple Vitamins-Minerals (MULTIVITAMIN PO) Take 1 tablet by mouth daily.  . Multiple Vitamins-Minerals (PRESERVISION AREDS PO) Take 1 capsule by mouth 2 (two) times daily.   . nitroGLYCERIN (NITROSTAT) 0.4 MG SL tablet 1 tab under the tongue for CP, may repeat in 5 minutes up to 3 doses  . oxyCODONE-acetaminophen (PERCOCET) 5-325 MG tablet Take 1-2 tablets by mouth every 6 (six) hours as needed for moderate pain or severe pain.  . rosuvastatin (CRESTOR) 5 MG tablet Take 5 mg by mouth every other day.     Past Medical History:  Diagnosis Date  . Anxiety   . Arthritis    spine and shoulder  . Cancer ([place])    skin  . Diabetes mellitus without complication ([place])   . Heart murmur   . Hyperlipidemia   .  Hypertension   . Mitral and aortic valve disease   . Myocardial infarction [facility])    may have had a "light" heart attack  . Occasional tremors   . Shingles    patient unaware but daughter confirms. it was a long time ago  . Stroke [facility]) 01/2017   may have had a slight stroke  . TIA (transient ischemic attack) 01/2017  . UTI (urinary tract infection)   . Vascular disease, peripheral [facility])     Past Surgical History:  Procedure Laterality Date  . CARDIAC CATHETERIZATION  1998   40% LM, 95% Ramus interm  . CATARACT EXTRACTION, BILATERAL    . COLONOSCOPY WITH PROPOFOL N/A 08/12/2018   Procedure: COLONOSCOPY WITH PROPOFOL;  Surgeon: Lucilla Lame, MD;  Location: [facility] ENDOSCOPY;  Service:  Endoscopy;  Laterality: N/A;  . EYE SURGERY Bilateral    cataract extractions  . LOWER EXTREMITY ANGIOGRAPHY Left 08/23/2017   Procedure: Lower Extremity Angiography;  Surgeon: Algernon Huxley, MD;  Location: [place] CV LAB;  Service: Cardiovascular;  Laterality: Left;  . LOWER EXTREMITY ANGIOGRAPHY Left 07/26/2018   Procedure: LOWER EXTREMITY ANGIOGRAPHY;  Surgeon: Katha Cabal, MD;  Location: [place] CV LAB;  Service: Cardiovascular;  Laterality: Left;  . LOWER EXTREMITY ANGIOGRAPHY Left 09/16/2018   Procedure: LOWER EXTREMITY ANGIOGRAPHY;  Surgeon: Katha Cabal, MD;  Location: [place] CV LAB;  Service: Cardiovascular;  Laterality: Left;  . PTCA  08/2013   Left common iliac  . PTCA  12/2012   left ext iliac  . TUBAL LIGATION      Social History Social History   Tobacco Use  . Smoking status: Former Smoker    Packs/day: 2.00    Years: 37.00    Pack years: 74.00    Types: Cigarettes    Last attempt to quit: 1980    Years since quitting: 39.9  . Smokeless tobacco: Never Used  . Tobacco comment: smoking cessation materials not required  Substance Use Topics  . Alcohol use: No    Alcohol/week: 0.0 standard drinks  . Drug use: No    Family History Family History  Problem Relation Age of Onset  . Dementia Mother   . Diabetes Father     Allergies  Allergen Reactions  . Saxagliptin Diarrhea  . Epinephrine Other (See Comments)    Patient does not remember what happens when she uses this  . Atorvastatin Other (See Comments)    Muscle aches  . Codeine Other (See Comments)    Upset stomach  . Ezetimibe Other (See Comments)    Myalgias(ZETIA)  . Limonene Rash    Patient does not recall this reaction  . Nitrofurantoin Rash and Other (See Comments)    Pruitus  . Sulfa Antibiotics Rash and Other (See Comments)    Sore mouth      REVIEW OF SYSTEMS (Negative unless checked)  Constitutional: [] Weight loss  [] Fever  [] Chills Cardiac: [] Chest  pain   [] Chest pressure   [] Palpitations   [] Shortness of breath when laying flat   [] Shortness of breath with exertion. Vascular:  [x] Pain in legs with walking   [x] Pain in legs at rest  [] History of DVT   [] Phlebitis   [] Swelling in legs   [] Varicose veins   [] Non-healing ulcers Pulmonary:   [] Uses home oxygen   [] Productive cough   [] Hemoptysis   [] Wheeze  [] COPD   [] Asthma Neurologic:  [] Dizziness   [] Seizures   [] History of stroke   [] History of TIA  [] Aphasia   []   Vissual changes   [] Weakness or numbness in arm   [] Weakness or numbness in leg Musculoskeletal:   [] Joint swelling   [] Joint pain   [] Low back pain Hematologic:  [] Easy bruising  [] Easy bleeding   [] Hypercoagulable state   [] Anemic Gastrointestinal:  [] Diarrhea   [] Vomiting  [x] Gastroesophageal reflux/heartburn   [] Difficulty swallowing. Genitourinary:  [] Chronic kidney disease   [] Difficult urination  [] Frequent urination   [] Blood in urine Skin:  [] Rashes   [] Ulcers  Psychological:  [] History of anxiety   []  History of major depression.  Physical Examination  Vitals:   09/19/18 1510  BP: (!) 139/51  Pulse: 65  Resp: 18  Weight: 127 lb (57.6 kg)  Height: 5\' 3"  (1.6 m)   Body mass index is 22.5 kg/m. Gen: WD/WN, NAD Head: [place]/AT, No temporalis wasting.  Ear/Nose/Throat: Hearing grossly intact, nares w/o erythema or drainage Eyes: PER, EOMI, sclera nonicteric.  Neck: Supple, no large masses.   Pulmonary:  Good air movement, no audible wheezing bilaterally, no use of accessory muscles.  Cardiac: RRR, no JVD Vascular: Left foot is cool to the touch with sluggish capillary refill Vessel Right Left  Radial Palpable Palpable  PT  trace palpable  not palpable  DP  trace palpable  not palpable  Gastrointestinal: Non-distended. No guarding/no peritoneal signs.  Musculoskeletal: M/S 5/5 throughout.  No deformity or atrophy.  Neurologic: CN 2-12 intact. Symmetrical.  Speech is fluent. Motor exam as listed above. Psychiatric:  Judgment intact, Mood & affect appropriate for pt's clinical situation. Dermatologic: No rashes or ulcers noted.  No changes consistent with cellulitis. Lymph : No lichenification or skin changes of chronic lymphedema.  CBC Lab Results  Component Value Date   WBC 7.7 08/12/2018   HGB 8.9 (L) 08/23/2018   HCT 27.4 (L) 08/23/2018   MCV 91.2 08/12/2018   PLT 249 08/12/2018    BMET    Component Value Date/Time   NA 138 08/11/2018 1023   NA 136 01/16/2016 1135   NA 138 05/29/2014 1202   K 4.7 08/11/2018 1023   K 5.1 05/29/2014 1202   CL 107 08/11/2018 1023   CL 106 05/29/2014 1202   CO2 23 08/11/2018 1023   CO2 25 05/29/2014 1202   GLUCOSE 178 (H) 08/11/2018 1023   GLUCOSE 269 (H) 05/29/2014 1202   BUN 30 (H) 09/15/2018 1314   BUN 19 01/16/2016 1135   BUN 19 (H) 05/29/2014 1202   CREATININE 1.25 (H) 09/15/2018 1314   CREATININE 1.10 05/29/2014 1202   CALCIUM 9.2 08/11/2018 1023   CALCIUM 9.1 05/29/2014 1202   GFRNONAA 39 (L) 09/15/2018 1314   GFRNONAA 48 (L) 05/29/2014 1202   GFRAA 45 (L) 09/15/2018 1314   GFRAA 56 (L) 05/29/2014 1202   Estimated Creatinine Clearance: 29.2 mL/min (A) (by C-G formula based on SCr of 1.25 mg/dL (H)).  COAG Lab Results  Component Value Date   INR 1.08 08/11/2018   INR 1.08 04/09/2017   INR 1.06 02/15/2017    Radiology Vas [place] Burnard Bunting With/wo Tbi  Result Date: 09/08/2018 LOWER EXTREMITY DOPPLER STUDY Indications: Peripheral artery disease, and CTA 09/06/2018. Other Factors: Known left SFA occlusion per previous duplex on 02/06/16 &                angiogram.  Vascular Interventions: 12/28/12-Left EIA & SFA stents;                         09/08/13-Left CIA stent & left CFA  PTA;                         05/29/14-Left SFA PTA;                         08/23/17-Right CIA & left CFA PTAs. Comparison Study: 07/05/2018 Performing Technologist: Concha Norway RVT  Examination Guidelines: A complete evaluation includes at minimum, Doppler waveform signals and  systolic blood pressure reading at the level of bilateral brachial, anterior tibial, and posterior tibial arteries, when vessel segments are accessible. Bilateral testing is considered an integral part of a complete examination. Photoelectric Plethysmograph (PPG) waveforms and toe systolic pressure readings are included as required and additional duplex testing as needed. Limited examinations for reoccurring indications may be performed as noted.  ABI Findings: +---------+------------------+-----+---------+--------+ Right    Rt Pressure (mmHg)IndexWaveform Comment  +---------+------------------+-----+---------+--------+ Brachial 147                                      +---------+------------------+-----+---------+--------+ ATA      158               1.07 triphasic         +---------+------------------+-----+---------+--------+ PTA      153               1.04 biphasic          +---------+------------------+-----+---------+--------+ Great Toe103               0.70 Normal            +---------+------------------+-----+---------+--------+ +---------+------------------+-----+----------+-------+ Left     Lt Pressure (mmHg)IndexWaveform  Comment +---------+------------------+-----+----------+-------+ Brachial 145                                      +---------+------------------+-----+----------+-------+ ATA      94                0.64 biphasic          +---------+------------------+-----+----------+-------+ PTA      137               0.93 monophasic        +---------+------------------+-----+----------+-------+ Great Toe56                0.38 Abnormal          +---------+------------------+-----+----------+-------+ +-------+-----------+-----------+------------+------------+ ABI/TBIToday's ABIToday's TBIPrevious ABIPrevious TBI +-------+-----------+-----------+------------+------------+ Right  1.07       .70        .88         .47           +-------+-----------+-----------+------------+------------+ Left   .93        .38        .65         .22          +-------+-----------+-----------+------------+------------+ Bilateral ABIs appear increased compared to prior study on 07/05/2018.  Summary: Right: Resting right ankle-brachial index is within normal range. No evidence of significant right lower extremity arterial disease. The right toe-brachial index is normal. Left: Resting left ankle-brachial index indicates mild left lower extremity arterial disease. The left toe-brachial index is abnormal.  *See table(s) above for measurements and observations.  Electronically signed by Hortencia Pilar MD on 09/08/2018 at 5:23:01 PM.    Final  Ct Angio Abd/pel W/ And/or W/o  Result Date: 09/06/2018 CLINICAL DATA:  83 year old female with a several week history of left lower extremity and groin pain. EXAM: CT ANGIOGRAPHY ABDOMEN AND PELVIS WITH CONTRAST AND WITHOUT CONTRAST TECHNIQUE: Multidetector CT imaging of the abdomen and pelvis was performed using the standard protocol during bolus administration of intravenous contrast. Multiplanar reconstructed images and MIPs were obtained and reviewed to evaluate the vascular anatomy. CONTRAST:  27mL ISOVUE-370 IOPAMIDOL (ISOVUE-370) INJECTION 76% COMPARISON:  None. FINDINGS: VASCULAR Aorta: Extensive atherosclerotic calcifications throughout the abdominal aorta. No evidence of aneurysm or dissection. Celiac: Patent without evidence of aneurysm, dissection, vasculitis or significant stenosis. SMA: Bulky calcified atherosclerotic plaque at the origin of the superior mesenteric artery results in high-grade stenosis. The vessel opacifies distally and is not occluded. Renals: Multiple renal arteries bilaterally. There are 2 renal arteries on the right, and 2 on the left. Heterogeneous atherosclerotic plaque without significant stenosis in either of the 2 right renal arteries. On the left, heterogeneous  atherosclerotic plaque results in a moderate focal stenosis of the dominant left renal artery. The smaller lower pole accessory renal artery is patent. IMA: Patent without evidence of aneurysm, dissection, vasculitis or significant stenosis. Inflow: Heavily diseased iliac arteries with bulky calcified plaque bilaterally. Left common iliac stent, right external iliac stent and left external iliac into common femoral arterial stent are all widely patent. Both internal iliac arteries are patent. Proximal Outflow: The left superficial femoral artery occludes shortly beyond the origin. In incompletely imaged stent in the proximal SFA is also occluded. The profunda femoral artery remains patent. On the right, the visualized runoff arteries are patent. Veins: No focal venous abnormality. Review of the MIP images confirms the above findings. NON-VASCULAR Lower chest: The visualized heart is normal in size. No pericardial effusion. Unremarkable visualized distal thoracic esophagus. A 9 mm ground-glass attenuation nodular opacity is present in the periphery of the right lower lobe (image 45 series 8). There is a background of mild centrilobular pulmonary emphysema. Hepatobiliary: Normal hepatic contour and morphology. Several circumscribed nonenhancing water attenuation lesions are present throughout the liver most consistent with simple cysts. Some of the abnormalities are too small to characterize but not overtly suspicious. No intra or extrahepatic biliary ductal dilatation. Pancreas: Unremarkable. No pancreatic ductal dilatation or surrounding inflammatory changes. Spleen: Normal in size without focal abnormality. Adrenals/Urinary Tract: The adrenal glands are normal. There are several circumscribed water attenuation cystic lesions in both kidneys. No evidence of enhancement. Findings are most consistent with simple renal cysts. A solitary cyst in the upper pole of the left kidney demonstrates thin internal septations  consistent with minimal complexity. No evidence of hydronephrosis or nephrolithiasis. Stomach/Bowel: Colonic diverticular disease without CT evidence of active inflammation. No evidence of obstruction or focal bowel wall thickening. Normal appendix in the right lower quadrant. The terminal ileum is unremarkable. Lymphatic: No suspicious lymphadenopathy. Reproductive: Uterus and bilateral adnexa are unremarkable. Other: Small fat containing right inguinal hernia. No evidence of ascites. Musculoskeletal: No acute fracture or aggressive appearing lytic or blastic osseous lesion. Multilevel degenerative disc disease. Mild levoconvex scoliosis. IMPRESSION: VASCULAR 1. Left superficial femoral artery occlusion of undetermined chronicity. 2. Extensive calcified atherosclerotic plaque throughout the iliac arteries. Bilateral previously placed stents all remain patent without evidence of significant InStent stenosis or occlusion. 3.  Aortic Atherosclerosis (ICD10-170.0) 4. High-grade stenosis in the proximal superior mesenteric artery secondary to heavily calcified atherosclerotic plaque. 5. Focal moderate to high-grade stenosis at the origin of the left main renal  artery. NON-VASCULAR 1. Concerning 0.9 cm ground-glass attenuation airspace opacity in the right lower lobe. Differential considerations include a focal infectious/inflammatory nodule versus a low grade adenocarcinoma or adenocarcinoma in situ. Initial follow-up with CT at 6-12 months is recommended to confirm persistence. If persistent, repeat CT is recommended every 2 years until 5 years of stability has been established. This recommendation follows the consensus statement: Guidelines for Management of Incidental Pulmonary Nodules Detected on CT Images:From the Fleischner Society 2017; published online before print (10.1148/radiol.3810175102). 2. Mild centrilobular pulmonary emphysema. 3. Hepatic and renal cysts. 4. Small fat containing right inguinal hernia. 5.  Mild levoconvex scoliosis, likely degenerative in nature. 6. Multilevel degenerative disc disease. These results will be called to the ordering clinician or representative by the Radiologist Assistant, and communication documented in the PACS or zVision Dashboard. Signed, Criselda Peaches, MD, [facility] Electronically Signed   By: Jacqulynn Cadet M.D.   On: 09/06/2018 15:33      Assessment/Plan 1. Atherosclerosis of native artery of left lower extremity with rest pain (HCC)  Recommend:  The patient has evidence of severe atherosclerotic changes of both lower extremities associated with rest pain of the left foot.  This represents a limb threatening ischemia and places the patient at the risk for left limb loss.  Angiography has been performed and the situation is not ideal for intervention.  Given this finding open surgical repair is recommended.  I recommend left common and SFA endarterectomy and then angioplasty and stent placement of the SFA occlusion using via bond stents.  I have also discussed the possibility of a femoral to popliteal bypass with prosthetic.  Vein mapping has been performed in her great saphenous veins are of inadequate size bilaterally.  Small saphenous veins demonstrate chronic thrombus.  Patient should undergo arterial reconstruction of the left lower extremity with the hope for limb salvage.  The risks and benefits as well as the alternative therapies was discussed in detail with the patient.  All questions were answered.  Patient agrees to proceed with bypass surgery.  I will obtain cardiology evaluation and preoperative clearance.  The patient will follow up with me in the office after the procedure.    - Ambulatory referral to Cardiology  2. Bilateral carotid artery stenosis Recommend:  Given the patient's asymptomatic subcritical stenosis no further invasive testing or surgery at this  time.  Duplex ultrasound shows <70% stenosis bilaterally.  Continue antiplatelet therapy as prescribed Continue management of CAD, HTN and Hyperlipidemia Healthy heart diet,  encouraged exercise at least 4 times per week Follow up in 12 months with duplex ultrasound and physical exam   3. Hypertensive heart disease without CHF Continue antihypertensive medications as already ordered, these medications have been reviewed and there are no changes at this time.   4. Arteriosclerosis of coronary artery Continue cardiac and antihypertensive medications as already ordered and reviewed, no changes at this time.  Continue statin as ordered and reviewed, no changes at this time  Nitrates PRN for chest pain  - Ambulatory referral to Cardiology  5. Gastroesophageal reflux disease without esophagitis Continue PPI as already ordered, this medication has been reviewed and there are no changes at this time.  Avoidence of caffeine and alcohol  Moderate elevation of the head of the bed   6. Uncontrolled type 2 diabetes mellitus with diabetic polyneuropathy, with long-term current use of insulin (HCC) Continue hypoglycemic medications as already ordered, these medications have been reviewed and there  are no changes at this time.  Hgb A1C to be monitored as already arranged by primary service     Hortencia Pilar, MD  09/20/2018 9:52 AM

## 2018-09-26 ENCOUNTER — Telehealth (INDEPENDENT_AMBULATORY_CARE_PROVIDER_SITE_OTHER): Payer: Self-pay | Admitting: Vascular Surgery

## 2018-09-27 ENCOUNTER — Telehealth (INDEPENDENT_AMBULATORY_CARE_PROVIDER_SITE_OTHER): Payer: Self-pay | Admitting: Vascular Surgery

## 2018-09-27 NOTE — Telephone Encounter (Signed)
This fax number that was given is actually the patient daughter's phone number. The actual fax number is [phone]

## 2018-09-27 NOTE — Telephone Encounter (Signed)
The cardiac clearance sheet was faxed to Dr. Nehemiah Massed at [facility] clinic yesterday for the patient.

## 2018-09-27 NOTE — Telephone Encounter (Signed)
This has been faxed to [facility] internal medicine Attn: Caryl Pina.

## 2018-09-28 ENCOUNTER — Telehealth (INDEPENDENT_AMBULATORY_CARE_PROVIDER_SITE_OTHER): Payer: Self-pay

## 2018-09-28 NOTE — Telephone Encounter (Signed)
Jill Hudson should continue to utilize the pain medication for pain control until we are able to get cardiac clearance to have her surgery scheduled.  If she is out of pain medication I would be happy to send her in some.  If the pain becomes unbearable despite pain medication usage, she should go to the ED to see if there are alternative ways to gain control of the pain.

## 2018-09-28 NOTE — Telephone Encounter (Signed)
Patient called and stated that she is having severe pain form her hip down into what seems to be a numb foot.  She would like to know what do you advise that she do, or should she be seen in the office?

## 2018-09-28 NOTE — Telephone Encounter (Signed)
Patient states that she is scheduled for her cardiac clearance on tomorrow. She is taking the pain medication as prescribed and she states that her pain is is better tolerated when she does even though she does not like the medication and how it makes her feel.

## 2018-09-29 ENCOUNTER — Ambulatory Visit
Admission: EM | Admit: 2018-09-29 | Discharge: 2018-09-29 | Disposition: A | Discharge status: Home / Self Care | Payer: Medicare Other | Attending: Family Medicine | Admitting: Family Medicine

## 2018-09-29 ENCOUNTER — Other Ambulatory Visit: Payer: Self-pay

## 2018-09-29 ENCOUNTER — Ambulatory Visit (INDEPENDENT_AMBULATORY_CARE_PROVIDER_SITE_OTHER): Payer: Medicare Other | Admitting: Vascular Surgery

## 2018-09-29 ENCOUNTER — Ambulatory Visit: Payer: Medicare Other

## 2018-09-29 DIAGNOSIS — Z881 Allergy status to other antibiotic agents status: Secondary | ICD-10-CM | POA: Insufficient documentation

## 2018-09-29 DIAGNOSIS — Z833 Family history of diabetes mellitus: Secondary | ICD-10-CM | POA: Diagnosis not present

## 2018-09-29 DIAGNOSIS — Z882 Allergy status to sulfonamides status: Secondary | ICD-10-CM | POA: Diagnosis not present

## 2018-09-29 DIAGNOSIS — Z885 Allergy status to narcotic agent status: Secondary | ICD-10-CM | POA: Insufficient documentation

## 2018-09-29 DIAGNOSIS — M2012 Hallux valgus (acquired), left foot: Secondary | ICD-10-CM | POA: Diagnosis not present

## 2018-09-29 DIAGNOSIS — M791 Myalgia, unspecified site: Secondary | ICD-10-CM | POA: Diagnosis not present

## 2018-09-29 DIAGNOSIS — Z79899 Other long term (current) drug therapy: Secondary | ICD-10-CM | POA: Diagnosis not present

## 2018-09-29 DIAGNOSIS — I252 Old myocardial infarction: Secondary | ICD-10-CM | POA: Diagnosis not present

## 2018-09-29 DIAGNOSIS — M2352 Chronic instability of knee, left knee: Secondary | ICD-10-CM | POA: Diagnosis not present

## 2018-09-29 DIAGNOSIS — I2510 Atherosclerotic heart disease of native coronary artery without angina pectoris: Secondary | ICD-10-CM | POA: Insufficient documentation

## 2018-09-29 DIAGNOSIS — K219 Gastro-esophageal reflux disease without esophagitis: Secondary | ICD-10-CM | POA: Diagnosis not present

## 2018-09-29 DIAGNOSIS — W228XXA Striking against or struck by other objects, initial encounter: Secondary | ICD-10-CM

## 2018-09-29 DIAGNOSIS — Z87891 Personal history of nicotine dependence: Secondary | ICD-10-CM | POA: Diagnosis not present

## 2018-09-29 DIAGNOSIS — E785 Hyperlipidemia, unspecified: Secondary | ICD-10-CM | POA: Diagnosis not present

## 2018-09-29 DIAGNOSIS — I129 Hypertensive chronic kidney disease with stage 1 through stage 4 chronic kidney disease, or unspecified chronic kidney disease: Secondary | ICD-10-CM | POA: Insufficient documentation

## 2018-09-29 DIAGNOSIS — I10 Essential (primary) hypertension: Secondary | ICD-10-CM | POA: Diagnosis not present

## 2018-09-29 DIAGNOSIS — F419 Anxiety disorder, unspecified: Secondary | ICD-10-CM | POA: Insufficient documentation

## 2018-09-29 DIAGNOSIS — Z7982 Long term (current) use of aspirin: Secondary | ICD-10-CM | POA: Insufficient documentation

## 2018-09-29 DIAGNOSIS — S92912A Unspecified fracture of left toe(s), initial encounter for closed fracture: Secondary | ICD-10-CM | POA: Insufficient documentation

## 2018-09-29 DIAGNOSIS — Z8673 Personal history of transient ischemic attack (TIA), and cerebral infarction without residual deficits: Secondary | ICD-10-CM | POA: Diagnosis not present

## 2018-09-29 DIAGNOSIS — G5603 Carpal tunnel syndrome, bilateral upper limbs: Secondary | ICD-10-CM | POA: Insufficient documentation

## 2018-09-29 DIAGNOSIS — N183 Chronic kidney disease, stage 3 (moderate): Secondary | ICD-10-CM | POA: Insufficient documentation

## 2018-09-29 DIAGNOSIS — R011 Cardiac murmur, unspecified: Secondary | ICD-10-CM | POA: Diagnosis not present

## 2018-09-29 DIAGNOSIS — E1122 Type 2 diabetes mellitus with diabetic chronic kidney disease: Secondary | ICD-10-CM | POA: Insufficient documentation

## 2018-09-29 DIAGNOSIS — E1142 Type 2 diabetes mellitus with diabetic polyneuropathy: Secondary | ICD-10-CM | POA: Insufficient documentation

## 2018-09-29 DIAGNOSIS — S92512A Displaced fracture of proximal phalanx of left lesser toe(s), initial encounter for closed fracture: Secondary | ICD-10-CM

## 2018-09-29 DIAGNOSIS — E782 Mixed hyperlipidemia: Secondary | ICD-10-CM | POA: Diagnosis not present

## 2018-09-29 DIAGNOSIS — S92919A Unspecified fracture of unspecified toe(s), initial encounter for closed fracture: Secondary | ICD-10-CM

## 2018-09-29 DIAGNOSIS — X58XXXA Exposure to other specified factors, initial encounter: Secondary | ICD-10-CM | POA: Insufficient documentation

## 2018-09-29 DIAGNOSIS — Z794 Long term (current) use of insulin: Secondary | ICD-10-CM | POA: Diagnosis not present

## 2018-09-29 DIAGNOSIS — M5136 Other intervertebral disc degeneration, lumbar region: Secondary | ICD-10-CM | POA: Diagnosis not present

## 2018-09-29 DIAGNOSIS — S9032XA Contusion of left foot, initial encounter: Secondary | ICD-10-CM | POA: Diagnosis not present

## 2018-09-29 DIAGNOSIS — M79672 Pain in left foot: Secondary | ICD-10-CM | POA: Diagnosis not present

## 2018-09-29 NOTE — Discharge Instructions (Addendum)
Apply ice 20 minutes out of every 2 hours 4-5 times daily for comfort.  Elevate your foot above the level of your heart sufficiently to control swelling and pain.  Postop shoe for comfort.  If not improving follow-up with orthopedic surgery

## 2018-09-29 NOTE — ED Provider Notes (Signed)
[facility]    CSN: [id number] Arrival date & time: 09/29/18  1107     History   Chief Complaint Chief Complaint  Patient presents with  . Foot Pain    left    HPI Jill Hudson is a 83 y.o. female.   HPI  83 year old female accompanied by her daughter presents with left foot injury happened around 3 to 4 days ago.  She states that she hit her foot on a stool side her bed.  She has bruising now of the second third and fourth toes distal to the MTP joint.  Is a severe full arterial disease of the left lower extremity and is scheduled for a emptied angioplasty next week.  Angioplasty is not possible she will then undergo bypass surgery.         Past Medical History:  Diagnosis Date  . Anxiety   . Arthritis    spine and shoulder  . Cancer ([place])    skin  . Diabetes mellitus without complication ([place])   . Heart murmur   . Hyperlipidemia   . Hypertension   . Mitral and aortic valve disease   . Myocardial infarction [facility])    may have had a "light" heart attack  . Occasional tremors   . Shingles    patient unaware but daughter confirms. it was a long time ago  . Stroke [facility]) 01/2017   may have had a slight stroke  . TIA (transient ischemic attack) 01/2017  . UTI (urinary tract infection)   . Vascular disease, peripheral [facility])     Patient Active Problem List   Diagnosis Date Noted  . GIB (gastrointestinal bleeding) 08/11/2018  . Leg pain 07/03/2017  . Carpal tunnel syndrome on both sides 05/05/2017  . Myalgia due to HMG CoA reductase inhibitor 05/05/2017  . CVA (cerebral vascular accident) ([place]) 02/15/2017  . Degenerative disc disease, lumbar 12/21/2016  . Atherosclerosis of native arteries of extremity with rest pain ([place]) 12/20/2016  . Bilateral carotid artery stenosis 12/20/2016  . Hip bursitis 05/18/2016  . Elevated TSH 01/18/2016  . Chronic renal insufficiency, stage III (moderate) ([place]) 01/16/2016  . Senile ecchymosis 01/16/2016  .  Uncontrolled type 2 diabetes mellitus with diabetic polyneuropathy, with long-term current use of insulin ([place]) 09/16/2015  . GERD (gastroesophageal reflux disease) 07/24/2015  . PAD (peripheral artery disease) ([place]) 05/17/2015  . Neoplasm of uncertain behavior of skin 05/17/2015  . Retinopathy, diabetic, proliferative ([place]) 04/11/2015  . Mixed hyperlipidemia 04/11/2015  . Essential (primary) hypertension 04/11/2015  . Generalized OA 04/11/2015  . Arteriosclerosis of coronary artery 05/29/2013  . Hypertensive heart disease without CHF 05/29/2013    Past Surgical History:  Procedure Laterality Date  . CARDIAC CATHETERIZATION  1998   40% LM, 95% Ramus interm  . CATARACT EXTRACTION, BILATERAL    . COLONOSCOPY WITH PROPOFOL N/A 08/12/2018   Procedure: COLONOSCOPY WITH PROPOFOL;  Surgeon: Lucilla Lame, MD;  Location: [facility] ENDOSCOPY;  Service: Endoscopy;  Laterality: N/A;  . EYE SURGERY Bilateral    cataract extractions  . LOWER EXTREMITY ANGIOGRAPHY Left 08/23/2017   Procedure: Lower Extremity Angiography;  Surgeon: Algernon Huxley, MD;  Location: [place] [facility];  Service: Cardiovascular;  Laterality: Left;  . LOWER EXTREMITY ANGIOGRAPHY Left 07/26/2018   Procedure: LOWER EXTREMITY ANGIOGRAPHY;  Surgeon: Katha Cabal, MD;  Location: [place] [facility];  Service: Cardiovascular;  Laterality: Left;  . LOWER EXTREMITY ANGIOGRAPHY Left 09/16/2018   Procedure: LOWER EXTREMITY ANGIOGRAPHY;  Surgeon: Hortencia Pilar  G, MD;  Location: [place] [facility];  Service: Cardiovascular;  Laterality: Left;  . PTCA  08/2013   Left common iliac  . PTCA  12/2012   left ext iliac  . TUBAL LIGATION      OB History   None      Home Medications    Prior to Admission medications   Medication Sig Start Date End Date Taking? Authorizing Provider  acetaminophen (TYLENOL) 650 MG CR tablet Take 1,300 mg by mouth every 8 (eight) hours as needed for pain.   Yes [provider]    amLODipine (NORVASC) 2.5 MG tablet Take 2.5 mg by mouth daily. 05/20/18  Yes [provider]  aspirin 81 MG tablet Take 81 mg by mouth daily.    Yes [provider]  B Complex Vitamins (VITAMIN-B COMPLEX) TABS Take 1 tablet by mouth daily.    Yes [provider]  baclofen (LIORESAL) 10 MG tablet TAKE 1 TABLET(10 MG) BY MOUTH THREE TIMES DAILY Patient taking differently: Take 10 mg by mouth at bedtime.  02/09/18  Yes [name], MD  Biotin 10 MG TABS Take 10 mg by mouth daily.    Yes [provider]  CALCIUM-MAGNESIUM-ZINC PO Take 1 tablet by mouth daily.    Yes [provider]  chlorthalidone (HYGROTON) 25 MG tablet Take 25 mg by mouth daily.   Yes [provider]  clopidogrel (PLAVIX) 75 MG tablet Take 1 tablet (75 mg total) by mouth daily. 07/27/18  Yes Schnier, Dolores Lory, MD  Cyanocobalamin (RA VITAMIN B-12 TR) 1000 MCG TBCR Take 1,000 mcg by mouth daily.    Yes [provider]  diltiazem (CARTIA XT) 180 MG 24 hr capsule Take 180 mg by mouth 2 (two) times daily.    Yes [provider]  ferrous sulfate 325 (65 FE) MG tablet Take 325 mg by mouth daily with breakfast.   Yes [provider]  insulin aspart (NOVOLOG FLEXPEN) 100 UNIT/ML FlexPen Inject 4 Units into the skin daily after lunch.  03/26/17  Yes [provider]  Insulin Glargine (BASAGLAR KWIKPEN) 100 UNIT/ML SOPN Inject 12 Units into the skin daily.   Yes [provider]  loratadine (CLARITIN) 10 MG tablet Take 10 mg by mouth daily as needed for allergies.    Yes [provider]  losartan (COZAAR) 50 MG tablet Take 50 mg by mouth 2 (two) times daily.  06/20/18  Yes [provider]  metFORMIN (GLUCOPHAGE-XR) 500 MG 24 hr tablet TAKE ONE TABLET BY MOUTH TWICE DAILY Patient taking differently: Take 500 mg by mouth 2 (two) times daily.  01/26/18  Yes [name], MD  Multiple Vitamins-Minerals (MULTIVITAMIN PO) Take 1  tablet by mouth daily.   Yes [provider]  Multiple Vitamins-Minerals (PRESERVISION AREDS PO) Take 1 capsule by mouth 2 (two) times daily.    Yes [provider]  nitroGLYCERIN (NITROSTAT) 0.4 MG SL tablet 1 tab under the tongue for CP, may repeat in 5 minutes up to 3 doses 09/07/18  Yes [name], MD  oxyCODONE-acetaminophen (PERCOCET) 5-325 MG tablet Take 1-2 tablets by mouth every 6 (six) hours as needed for moderate pain or severe pain. 09/16/18 09/16/19 Yes Schnier, Dolores Lory, MD  rosuvastatin (CRESTOR) 5 MG tablet Take 5 mg by mouth every other day.  03/02/18  Yes [provider]    Family History Family History  Problem Relation Age of Onset  . Dementia Mother   . Diabetes  Father     Social History Social History   Tobacco Use  . Smoking status: Former Smoker    Packs/day: 2.00    Years: 37.00    Pack years: 74.00    Types: Cigarettes    Last attempt to quit: 1980    Years since quitting: 39.9  . Smokeless tobacco: Never Used  . Tobacco comment: smoking cessation materials not required  Substance Use Topics  . Alcohol use: No    Alcohol/week: 0.0 standard drinks  . Drug use: No     Allergies   Saxagliptin; Epinephrine; Atorvastatin; Codeine; Ezetimibe; Limonene; Nitrofurantoin; and Sulfa antibiotics   Review of Systems Review of Systems  Constitutional: Positive for activity change. Negative for appetite change, chills, fatigue and fever.  Musculoskeletal: Positive for arthralgias and gait problem.  All other systems reviewed and are negative.    Physical Exam Triage Vital Signs ED Triage Vitals  Enc Vitals Group     BP 09/29/18 1126 (!) 155/65     Pulse Rate 09/29/18 1126 61     Resp 09/29/18 1126 18     Temp 09/29/18 1126 97.9 F (36.6 C)     Temp Source 09/29/18 1126 Oral     SpO2 09/29/18 1126 99 %     Weight 09/29/18 1124 127 lb (57.6 kg)     Height 09/29/18 1124 5\' 3"  (1.6 m)     Head Circumference --       Peak Flow --      Pain Score 09/29/18 1124 7     Pain Loc --      Pain Edu? --      Excl. in [place]? --    No data found.  Updated Vital Signs BP (!) 155/65 (BP Location: Left Arm)   Pulse 61   Temp 97.9 F (36.6 C) (Oral)   Resp 18   Ht 5\' 3"  (1.6 m)   Wt 127 lb (57.6 kg)   SpO2 99%   BMI 22.50 kg/m   Visual Acuity Right Eye Distance:   Left Eye Distance:   Bilateral Distance:    Right Eye Near:   Left Eye Near:    Bilateral Near:     Physical Exam  Constitutional: She is oriented to person, place, and time. She appears well-developed and well-nourished. No distress.  HENT:  Head: Normocephalic.  Eyes: Pupils are equal, round, and reactive to light. Right eye exhibits no discharge. Left eye exhibits no discharge.  Neck: Normal range of motion.  Musculoskeletal: She exhibits edema and tenderness.  Examination of the left foot ecchymosis of the second third and fourth toes.  She is tender over the TP and proximal phalanx of the third toe maximally and less so over the fourth.  Neurovascular function is intact distally.  No rotational deformity seen.  Neurological: She is alert and oriented to person, place, and time.  Skin: Skin is warm and dry. She is not diaphoretic.  Psychiatric: She has a normal mood and affect. Her behavior is normal. Thought content normal.  Nursing note and vitals reviewed.    UC Treatments / Results  Labs (all labs ordered are listed, but only abnormal results are displayed) Labs Reviewed - No data to display  EKG None  Radiology Dg Foot Complete Left  Result Date: 09/29/2018 CLINICAL DATA:  Stubbed foot on stool, bruising of the first through the fourth MTP joints, pain in the third MTP joint EXAM: LEFT FOOT - COMPLETE 3+ VIEW COMPARISON:  None.  FINDINGS: The bones are diffusely osteopenic. A tiny avulsion from the base of the proximal phalanx of the left third toe cannot be excluded. Otherwise no acute fracture is evident. There is  degenerative change of the left first MTP joint with mild hallux valgus present. Some degenerative changes present in the midfoot. Some calcification of the Achilles tendon is present. Arterial calcifications are noted. IMPRESSION: 1. Possible small avulsion fracture fragment from the base of the proximal phalanx of the left third toe. No other acute fracture. 2. Degenerative change of the left first MTP joint with mild hallux valgus. Electronically Signed   By: Ivar Drape M.D.   On: 09/29/2018 12:16    Procedures Procedures (including critical care time)  Medications Ordered in UC Medications - No data to display  Initial Impression / Assessment and Plan / UC Course  I have reviewed the triage vital signs and the nursing notes.  Pertinent labs & imaging results that were available during my care of the patient were reviewed by me and considered in my medical decision making (see chart for details). I have reviewed the x-rays with the patient.  Her that the avulsion fracture is likely acute and is in the ecchymosis.  Recommended buddy taping of the third and fourth together.  Also given her a postop shoe for better comfort with ambulation.  She continues to have problems she should be seen by orthopedic surgery.  Has very decreased blood supply to her lower extremity I have told them that she may have more problems longer-term till good blood flow is reestablished.    Final Clinical Impressions(s) / UC Diagnoses   Final diagnoses:  Fracture of proximal phalanx of toe     Discharge Instructions     Apply ice 20 minutes out of every 2 hours 4-5 times daily for comfort.  Elevate your foot above the level of your heart sufficiently to control swelling and pain.  Postop shoe for comfort.  If not improving follow-up with orthopedic surgery    ED Prescriptions    None     Controlled Substance Prescriptions [place] Controlled Substance Registry consulted? Not Applicable   Lorin Picket,  PA-C 09/29/18 1445

## 2018-09-29 NOTE — ED Triage Notes (Signed)
Patient states that she hit her foot on a step next to her bed around 3-4 days ago. Patient has bruising on 1st 4 toes.

## 2018-10-03 ENCOUNTER — Other Ambulatory Visit (INDEPENDENT_AMBULATORY_CARE_PROVIDER_SITE_OTHER): Payer: Self-pay | Admitting: Vascular Surgery

## 2018-10-03 ENCOUNTER — Telehealth (INDEPENDENT_AMBULATORY_CARE_PROVIDER_SITE_OTHER): Payer: Self-pay

## 2018-10-03 ENCOUNTER — Encounter (INDEPENDENT_AMBULATORY_CARE_PROVIDER_SITE_OTHER): Payer: Self-pay

## 2018-10-03 NOTE — Telephone Encounter (Signed)
Spoke with the patient regarding her surgery and pre-op. All of the information was given to the patient over the phone and will be mailed out as well. Surgery 10/12/18 and pre-op 10/06/18 @ 11:45 am.

## 2018-10-06 ENCOUNTER — Other Ambulatory Visit: Payer: Self-pay

## 2018-10-06 ENCOUNTER — Encounter
Admission: RE | Admit: 2018-10-06 | Discharge: 2018-10-06 | Disposition: A | Discharge status: Home / Self Care | Payer: Medicare Other | Source: Ambulatory Visit | Attending: Vascular Surgery | Admitting: Vascular Surgery

## 2018-10-06 DIAGNOSIS — Z01818 Encounter for other preprocedural examination: Secondary | ICD-10-CM | POA: Insufficient documentation

## 2018-10-06 DIAGNOSIS — Z01810 Encounter for preprocedural cardiovascular examination: Secondary | ICD-10-CM | POA: Diagnosis not present

## 2018-10-06 HISTORY — DX: Unspecified macular degeneration: H35.30

## 2018-10-06 HISTORY — DX: Allergy, unspecified, initial encounter: T78.40XA

## 2018-10-06 HISTORY — DX: Peripheral vascular disease, unspecified: I73.9

## 2018-10-06 LAB — CBC WITH DIFFERENTIAL/PLATELET
ABS IMMATURE GRANULOCYTES: 0.04 10*3/uL (ref 0.00–0.07)
Basophils Absolute: 0.1 10*3/uL (ref 0.0–0.1)
Basophils Relative: 1 %
EOS PCT: 2 %
Eosinophils Absolute: 0.2 10*3/uL (ref 0.0–0.5)
HCT: 30.9 % — ABNORMAL LOW (ref 36.0–46.0)
Hemoglobin: 10 g/dL — ABNORMAL LOW (ref 12.0–15.0)
Immature Granulocytes: 1 %
Lymphocytes Relative: 16 %
Lymphs Abs: 1.3 10*3/uL (ref 0.7–4.0)
MCH: 28.9 pg (ref 26.0–34.0)
MCHC: 32.4 g/dL (ref 30.0–36.0)
MCV: 89.3 fL (ref 80.0–100.0)
Monocytes Absolute: 0.6 10*3/uL (ref 0.1–1.0)
Monocytes Relative: 8 %
Neutro Abs: 5.5 10*3/uL (ref 1.7–7.7)
Neutrophils Relative %: 72 %
Platelets: 314 10*3/uL (ref 150–400)
RBC: 3.46 MIL/uL — ABNORMAL LOW (ref 3.87–5.11)
RDW: 13.1 % (ref 11.5–15.5)
WBC: 7.7 10*3/uL (ref 4.0–10.5)
nRBC: 0 % (ref 0.0–0.2)

## 2018-10-06 LAB — APTT: aPTT: 38 seconds — ABNORMAL HIGH (ref 24–36)

## 2018-10-06 LAB — TYPE AND SCREEN
ABO/RH(D): A POS
Antibody Screen: NEGATIVE
Extend sample reason: TRANSFUSED

## 2018-10-06 LAB — BASIC METABOLIC PANEL
Anion gap: 12 (ref 5–15)
BUN: 31 mg/dL — ABNORMAL HIGH (ref 8–23)
CO2: 22 mmol/L (ref 22–32)
Calcium: 9.1 mg/dL (ref 8.9–10.3)
Chloride: 95 mmol/L — ABNORMAL LOW (ref 98–111)
Creatinine, Ser: 1.37 mg/dL — ABNORMAL HIGH (ref 0.44–1.00)
GFR calc Af Amer: 42 mL/min — ABNORMAL LOW (ref >60)
GFR calc non Af Amer: 36 mL/min — ABNORMAL LOW (ref >60)
Glucose, Bld: 234 mg/dL — ABNORMAL HIGH (ref 70–99)
Potassium: 4.5 mmol/L (ref 3.5–5.1)
Sodium: 129 mmol/L — ABNORMAL LOW (ref 135–145)

## 2018-10-06 LAB — PROTIME-INR
INR: 1.09
Prothrombin Time: 14 seconds (ref 11.4–15.2)

## 2018-10-06 MED ORDER — CHLORHEXIDINE GLUCONATE CLOTH 2 % EX PADS
6.0000 | MEDICATED_PAD | Freq: Once | CUTANEOUS | Status: DC
  Filled 2018-10-06: qty 6

## 2018-10-06 NOTE — Patient Instructions (Signed)
Your procedure is scheduled on: 10/12/18 wed Report to Same Day Surgery 2nd floor medical mall [facility] Entrance-take elevator on left to 2nd floor.  Check in with surgery information desk.) To find out your arrival time please call [phone] between 1PM - 3PM on 10/11/18 Tues  Remember: Instructions that are not followed completely may result in serious medical risk, up to and including death, or upon the discretion of your surgeon and anesthesiologist your surgery may need to be rescheduled.    _x___ 1. Do not eat food after midnight the night before your procedure. You may drink clear liquids up to 2 hours before you are scheduled to arrive at the hospital for your procedure.  Do not drink clear liquids within 2 hours of your scheduled arrival to the hospital.  Clear liquids include  --Water or Apple juice without pulp  --Clear carbohydrate beverage such as ClearFast or Gatorade  --Black Coffee or Clear Tea (No milk, no creamers, do not add anything to                  the coffee or Tea Type 1 and type 2 diabetics should only drink water.   ____Ensure clear carbohydrate drink on the way to the hospital for bariatric patients  ____Ensure clear carbohydrate drink 3 hours before surgery for Dr Dwyane Luo patients if physician instructed.   No gum chewing or hard candies.     __x__ 2. No Alcohol for 24 hours before or after surgery.   __x__3. No Smoking or e-cigarettes for 24 prior to surgery.  Do not use any chewable tobacco products for at least 6 hour prior to surgery   ____  4. Bring all medications with you on the day of surgery if instructed.    __x__ 5. Notify your doctor if there is any change in your medical condition     (cold, fever, infections).    x___6. On the morning of surgery brush your teeth with toothpaste and water.  You may rinse your mouth with mouth wash if you wish.  Do not swallow any toothpaste or mouthwash.   Do not wear jewelry, make-up, hairpins,  clips or nail polish.  Do not wear lotions, powders, or perfumes. You may wear deodorant.  Do not shave 48 hours prior to surgery. Men may shave face and neck.  Do not bring valuables to the hospital.    [facility] is not responsible for any belongings or valuables.               Contacts, dentures or bridgework may not be worn into surgery.  Leave your suitcase in the car. After surgery it may be brought to your room.  For patients admitted to the hospital, discharge time is determined by your                       treatment team.  _  Patients discharged the day of surgery will not be allowed to drive home.  You will need someone to drive you home and stay with you the night of your procedure.    Please read over the following fact sheets that you were given:   [facility] Preparing for Surgery and or MRSA Information   _x___ Take anti-hypertensive listed below, cardiac, seizure, asthma,     anti-reflux and psychiatric medicines. These include:  1. chlorthalidone (HYGROTON) 25 MG tablet  2.diltiazem (CARTIA XT) 180 MG 24 hr capsule  3.rosuvastatin (CRESTOR) 5 MG  tabletrosuvastatin (CRESTOR) 5 MG tablet  4.  5.  6.  ____Fleets enema or Magnesium Citrate as directed.   _x___ Use CHG Soap or sage wipes as directed on instruction sheet   ____ Use inhalers on the day of surgery and bring to hospital day of surgery  _x___ Stop Metformin and Janumet 2 days prior to surgery.    ____ Take 1/2 of usual insulin dose the night before surgery and none on the morning     surgery.   _x___ Follow recommendations from Cardiologist, Pulmonologist or PCP regarding          stopping Aspirin, Coumadin, Plavix ,Eliquis, Effient, or Pradaxa, and Pletal. Stop Plavix 5 days before surgery per MD instruction  X____Stop Anti-inflammatories such as Advil, Aleve, Ibuprofen, Motrin, Naproxen, Naprosyn, Goodies powders or aspirin products. OK to take Tylenol and                          Celebrex.   _x___  Stop supplements until after surgery.  But may continue Vitamin D, Vitamin B,       and multivitamin.   ____ Bring C-Pap to the hospital.

## 2018-10-07 NOTE — Pre-Procedure Instructions (Signed)
Met B, CBC, & PTT results sent to Dr. [name] and Anesthesia for review.

## 2018-10-11 MED ORDER — CEFAZOLIN SODIUM-DEXTROSE 2-4 GM/100ML-% IV SOLN
2.0000 g | INTRAVENOUS | Status: AC
  Administered 2018-10-12: 2 g via INTRAVENOUS

## 2018-10-12 ENCOUNTER — Other Ambulatory Visit: Payer: Self-pay

## 2018-10-12 ENCOUNTER — Inpatient Hospital Stay
Admission: RE | Admit: 2018-10-12 | Discharge: 2018-10-15 | DRG: 271 | Disposition: A | Discharge status: Home Health | Payer: Medicare Other | Attending: Vascular Surgery | Admitting: Vascular Surgery

## 2018-10-12 ENCOUNTER — Encounter
Admission: RE | Disposition: A | Discharge status: Home Health | Payer: Self-pay | Source: Home / Self Care | Attending: Vascular Surgery

## 2018-10-12 ENCOUNTER — Inpatient Hospital Stay: Payer: Medicare Other

## 2018-10-12 ENCOUNTER — Inpatient Hospital Stay: Payer: Medicare Other | Admitting: Anesthesiology

## 2018-10-12 DIAGNOSIS — N183 Chronic kidney disease, stage 3 (moderate): Secondary | ICD-10-CM | POA: Diagnosis present

## 2018-10-12 DIAGNOSIS — I70229 Atherosclerosis of native arteries of extremities with rest pain, unspecified extremity: Secondary | ICD-10-CM | POA: Diagnosis present

## 2018-10-12 DIAGNOSIS — Z885 Allergy status to narcotic agent status: Secondary | ICD-10-CM | POA: Diagnosis not present

## 2018-10-12 DIAGNOSIS — I252 Old myocardial infarction: Secondary | ICD-10-CM

## 2018-10-12 DIAGNOSIS — I959 Hypotension, unspecified: Secondary | ICD-10-CM | POA: Diagnosis not present

## 2018-10-12 DIAGNOSIS — I10 Essential (primary) hypertension: Secondary | ICD-10-CM | POA: Diagnosis not present

## 2018-10-12 DIAGNOSIS — Z794 Long term (current) use of insulin: Secondary | ICD-10-CM | POA: Diagnosis not present

## 2018-10-12 DIAGNOSIS — Z85828 Personal history of other malignant neoplasm of skin: Secondary | ICD-10-CM | POA: Diagnosis not present

## 2018-10-12 DIAGNOSIS — Z8673 Personal history of transient ischemic attack (TIA), and cerebral infarction without residual deficits: Secondary | ICD-10-CM

## 2018-10-12 DIAGNOSIS — E871 Hypo-osmolality and hyponatremia: Secondary | ICD-10-CM | POA: Diagnosis not present

## 2018-10-12 DIAGNOSIS — Z7982 Long term (current) use of aspirin: Secondary | ICD-10-CM | POA: Diagnosis not present

## 2018-10-12 DIAGNOSIS — E785 Hyperlipidemia, unspecified: Secondary | ICD-10-CM | POA: Diagnosis not present

## 2018-10-12 DIAGNOSIS — Z7902 Long term (current) use of antithrombotics/antiplatelets: Secondary | ICD-10-CM | POA: Diagnosis not present

## 2018-10-12 DIAGNOSIS — Z79899 Other long term (current) drug therapy: Secondary | ICD-10-CM

## 2018-10-12 DIAGNOSIS — Z87891 Personal history of nicotine dependence: Secondary | ICD-10-CM | POA: Diagnosis not present

## 2018-10-12 DIAGNOSIS — E1122 Type 2 diabetes mellitus with diabetic chronic kidney disease: Secondary | ICD-10-CM | POA: Diagnosis present

## 2018-10-12 DIAGNOSIS — I129 Hypertensive chronic kidney disease with stage 1 through stage 4 chronic kidney disease, or unspecified chronic kidney disease: Secondary | ICD-10-CM | POA: Diagnosis present

## 2018-10-12 DIAGNOSIS — Z882 Allergy status to sulfonamides status: Secondary | ICD-10-CM | POA: Diagnosis not present

## 2018-10-12 DIAGNOSIS — E1151 Type 2 diabetes mellitus with diabetic peripheral angiopathy without gangrene: Secondary | ICD-10-CM | POA: Diagnosis present

## 2018-10-12 DIAGNOSIS — I2510 Atherosclerotic heart disease of native coronary artery without angina pectoris: Secondary | ICD-10-CM | POA: Diagnosis present

## 2018-10-12 DIAGNOSIS — E119 Type 2 diabetes mellitus without complications: Secondary | ICD-10-CM | POA: Diagnosis not present

## 2018-10-12 DIAGNOSIS — Z888 Allergy status to other drugs, medicaments and biological substances status: Secondary | ICD-10-CM | POA: Diagnosis not present

## 2018-10-12 DIAGNOSIS — I739 Peripheral vascular disease, unspecified: Secondary | ICD-10-CM | POA: Diagnosis not present

## 2018-10-12 DIAGNOSIS — I70222 Atherosclerosis of native arteries of extremities with rest pain, left leg: Principal | ICD-10-CM | POA: Diagnosis present

## 2018-10-12 HISTORY — PX: ENDARTERECTOMY FEMORAL: SHX5804

## 2018-10-12 HISTORY — DX: Atherosclerosis of native arteries of extremities with rest pain, unspecified extremity: I70.229

## 2018-10-12 LAB — CBC
HCT: 30.1 % — ABNORMAL LOW (ref 36.0–46.0)
Hemoglobin: 9.7 g/dL — ABNORMAL LOW (ref 12.0–15.0)
MCH: 28.4 pg (ref 26.0–34.0)
MCHC: 32.2 g/dL (ref 30.0–36.0)
MCV: 88 fL (ref 80.0–100.0)
Platelets: 270 10*3/uL (ref 150–400)
RBC: 3.42 MIL/uL — ABNORMAL LOW (ref 3.87–5.11)
RDW: 12.6 % (ref 11.5–15.5)
WBC: 15 10*3/uL — ABNORMAL HIGH (ref 4.0–10.5)
nRBC: 0 % (ref 0.0–0.2)

## 2018-10-12 LAB — CREATININE, SERUM
Creatinine, Ser: 1.21 mg/dL — ABNORMAL HIGH (ref 0.44–1.00)
GFR calc Af Amer: 49 mL/min — ABNORMAL LOW (ref >60)
GFR calc non Af Amer: 42 mL/min — ABNORMAL LOW (ref >60)

## 2018-10-12 LAB — GLUCOSE, CAPILLARY
Glucose-Capillary: 194 mg/dL — ABNORMAL HIGH (ref 70–99)
Glucose-Capillary: 241 mg/dL — ABNORMAL HIGH (ref 70–99)
Glucose-Capillary: 281 mg/dL — ABNORMAL HIGH (ref 70–99)
Glucose-Capillary: 298 mg/dL — ABNORMAL HIGH (ref 70–99)
Glucose-Capillary: 334 mg/dL — ABNORMAL HIGH (ref 70–99)

## 2018-10-12 LAB — TYPE AND SCREEN
ABO/RH(D): A POS
Antibody Screen: NEGATIVE

## 2018-10-12 SURGERY — ENDARTERECTOMY, FEMORAL
Anesthesia: General | Laterality: Left

## 2018-10-12 MED ORDER — BIOTIN 10 MG PO TABS: 10.0000 mg | ORAL_TABLET | Freq: Every day | ORAL | Status: DC

## 2018-10-12 MED ORDER — DILTIAZEM HCL ER COATED BEADS 180 MG PO CP24
180.0000 mg | ORAL_CAPSULE | Freq: Two times a day (BID) | ORAL | Status: DC
  Administered 2018-10-13 – 2018-10-14 (×3): 180 mg via ORAL
  Filled 2018-10-12 (×5): qty 1

## 2018-10-12 MED ORDER — PROPOFOL 10 MG/ML IV BOLUS
INTRAVENOUS | Status: DC | PRN
  Administered 2018-10-12: 70 mg via INTRAVENOUS

## 2018-10-12 MED ORDER — MAGNESIUM SULFATE 2 GM/50ML IV SOLN: 2.0000 g | Freq: Every day | INTRAVENOUS | Status: DC | PRN

## 2018-10-12 MED ORDER — HEPARIN SODIUM (PORCINE) 1000 UNIT/ML IJ SOLN
INTRAMUSCULAR | Status: DC | PRN
  Administered 2018-10-12: 4000 [IU] via INTRAVENOUS

## 2018-10-12 MED ORDER — SODIUM CHLORIDE 0.9 % IV SOLN
INTRAVENOUS | Status: DC | PRN
  Administered 2018-10-12: 08:00:00 via INTRAVENOUS

## 2018-10-12 MED ORDER — VITAMIN B-12 1000 MCG PO TABS
1000.0000 ug | ORAL_TABLET | Freq: Every day | ORAL | Status: DC
  Administered 2018-10-12 – 2018-10-15 (×4): 1000 ug via ORAL
  Filled 2018-10-12 (×4): qty 1

## 2018-10-12 MED ORDER — SUGAMMADEX SODIUM 200 MG/2ML IV SOLN
INTRAVENOUS | Status: DC | PRN
  Administered 2018-10-12: 110.6 mg via INTRAVENOUS

## 2018-10-12 MED ORDER — ROSUVASTATIN CALCIUM 5 MG PO TABS
5.0000 mg | ORAL_TABLET | ORAL | Status: DC
  Administered 2018-10-14: 5 mg via ORAL
  Filled 2018-10-12: qty 1

## 2018-10-12 MED ORDER — ONDANSETRON HCL 4 MG/2ML IJ SOLN: 4.0000 mg | Freq: Four times a day (QID) | INTRAMUSCULAR | Status: DC | PRN

## 2018-10-12 MED ORDER — SORBITOL 70 % SOLN
30.0000 mL | Freq: Every day | Status: DC | PRN
  Filled 2018-10-12: qty 30

## 2018-10-12 MED ORDER — LORATADINE 10 MG PO TABS: 10.0000 mg | ORAL_TABLET | Freq: Every day | ORAL | Status: DC | PRN

## 2018-10-12 MED ORDER — DOCUSATE SODIUM 100 MG PO CAPS
100.0000 mg | ORAL_CAPSULE | Freq: Every day | ORAL | Status: DC
  Administered 2018-10-13 – 2018-10-15 (×3): 100 mg via ORAL
  Filled 2018-10-12 (×3): qty 1

## 2018-10-12 MED ORDER — ONDANSETRON HCL 4 MG/2ML IJ SOLN: 4.0000 mg | Freq: Once | INTRAMUSCULAR | Status: DC | PRN

## 2018-10-12 MED ORDER — CEFAZOLIN SODIUM-DEXTROSE 2-4 GM/100ML-% IV SOLN
2.0000 g | Freq: Three times a day (TID) | INTRAVENOUS | Status: AC
  Administered 2018-10-12 (×2): 2 g via INTRAVENOUS
  Filled 2018-10-12: qty 100

## 2018-10-12 MED ORDER — OXYCODONE-ACETAMINOPHEN 5-325 MG PO TABS
1.0000 | ORAL_TABLET | ORAL | Status: DC | PRN
  Administered 2018-10-12 – 2018-10-15 (×2): 1 via ORAL
  Filled 2018-10-12: qty 2
  Filled 2018-10-12: qty 1

## 2018-10-12 MED ORDER — INSULIN ASPART 100 UNIT/ML ~~LOC~~ SOLN
0.0000 [IU] | Freq: Three times a day (TID) | SUBCUTANEOUS | Status: DC
  Administered 2018-10-12: 5 [IU] via SUBCUTANEOUS
  Administered 2018-10-13: 2 [IU] via SUBCUTANEOUS
  Administered 2018-10-13: 3 [IU] via SUBCUTANEOUS
  Administered 2018-10-13: 7 [IU] via SUBCUTANEOUS
  Administered 2018-10-14 (×2): 2 [IU] via SUBCUTANEOUS
  Administered 2018-10-15: 1 [IU] via SUBCUTANEOUS
  Filled 2018-10-12 (×7): qty 1

## 2018-10-12 MED ORDER — HEPARIN SOD (PORK) LOCK FLUSH 10 UNIT/ML IV SOLN
INTRAVENOUS | Status: DC | PRN
  Administered 2018-10-12: 5 [IU]

## 2018-10-12 MED ORDER — LOSARTAN POTASSIUM 50 MG PO TABS
50.0000 mg | ORAL_TABLET | Freq: Two times a day (BID) | ORAL | Status: DC
  Administered 2018-10-13 – 2018-10-15 (×5): 50 mg via ORAL
  Filled 2018-10-12 (×6): qty 1

## 2018-10-12 MED ORDER — EVICEL 2 ML EX KIT
PACK | CUTANEOUS | Status: AC
  Filled 2018-10-12: qty 1

## 2018-10-12 MED ORDER — SENNOSIDES-DOCUSATE SODIUM 8.6-50 MG PO TABS: 1.0000 | ORAL_TABLET | Freq: Every evening | ORAL | Status: DC | PRN

## 2018-10-12 MED ORDER — SODIUM CHLORIDE 0.9 % IV SOLN
INTRAVENOUS | Status: DC
  Administered 2018-10-12 – 2018-10-13 (×2): via INTRAVENOUS

## 2018-10-12 MED ORDER — FAMOTIDINE 20 MG PO TABS
20.0000 mg | ORAL_TABLET | Freq: Once | ORAL | Status: AC
  Administered 2018-10-12: 20 mg via ORAL

## 2018-10-12 MED ORDER — CEFAZOLIN SODIUM-DEXTROSE 2-4 GM/100ML-% IV SOLN
INTRAVENOUS | Status: AC
  Filled 2018-10-12: qty 100

## 2018-10-12 MED ORDER — MORPHINE SULFATE (PF) 2 MG/ML IV SOLN: 2.0000 mg | INTRAVENOUS | Status: DC | PRN

## 2018-10-12 MED ORDER — GUAIFENESIN-DM 100-10 MG/5ML PO SYRP: 15.0000 mL | ORAL_SOLUTION | ORAL | Status: DC | PRN

## 2018-10-12 MED ORDER — ACETAMINOPHEN 325 MG PO TABS: 325.0000 mg | ORAL_TABLET | ORAL | Status: DC | PRN

## 2018-10-12 MED ORDER — ALUM & MAG HYDROXIDE-SIMETH 200-200-20 MG/5ML PO SUSP: 15.0000 mL | ORAL | Status: DC | PRN

## 2018-10-12 MED ORDER — INSULIN ASPART 100 UNIT/ML ~~LOC~~ SOLN: 0.0000 [IU] | Freq: Three times a day (TID) | SUBCUTANEOUS | Status: DC

## 2018-10-12 MED ORDER — DEXAMETHASONE SODIUM PHOSPHATE 10 MG/ML IJ SOLN
INTRAMUSCULAR | Status: DC | PRN
  Administered 2018-10-12: 10 mg via INTRAVENOUS

## 2018-10-12 MED ORDER — SUCCINYLCHOLINE CHLORIDE 20 MG/ML IJ SOLN
INTRAMUSCULAR | Status: DC | PRN
  Administered 2018-10-12: 80 mg via INTRAVENOUS

## 2018-10-12 MED ORDER — HEPARIN SODIUM (PORCINE) 5000 UNIT/ML IJ SOLN
INTRAMUSCULAR | Status: AC
  Filled 2018-10-12: qty 1

## 2018-10-12 MED ORDER — RENA-VITE PO TABS
1.0000 | ORAL_TABLET | Freq: Every day | ORAL | Status: DC
  Administered 2018-10-12 – 2018-10-15 (×4): 1 via ORAL
  Filled 2018-10-12 (×4): qty 1

## 2018-10-12 MED ORDER — FAMOTIDINE IN NACL 20-0.9 MG/50ML-% IV SOLN
20.0000 mg | Freq: Two times a day (BID) | INTRAVENOUS | Status: DC
  Administered 2018-10-12 – 2018-10-13 (×2): 20 mg via INTRAVENOUS
  Filled 2018-10-12 (×2): qty 50

## 2018-10-12 MED ORDER — SODIUM CHLORIDE 0.9 % IV SOLN: 500.0000 mL | Freq: Once | INTRAVENOUS | Status: DC | PRN

## 2018-10-12 MED ORDER — GLYCOPYRROLATE 0.2 MG/ML IJ SOLN
INTRAMUSCULAR | Status: DC | PRN
  Administered 2018-10-12: 0.2 mg via INTRAVENOUS

## 2018-10-12 MED ORDER — ACETAMINOPHEN 650 MG RE SUPP: 325.0000 mg | RECTAL | Status: DC | PRN

## 2018-10-12 MED ORDER — FENTANYL CITRATE (PF) 100 MCG/2ML IJ SOLN
25.0000 ug | INTRAMUSCULAR | Status: DC | PRN
  Administered 2018-10-12 (×3): 25 ug via INTRAVENOUS

## 2018-10-12 MED ORDER — FENTANYL CITRATE (PF) 100 MCG/2ML IJ SOLN
INTRAMUSCULAR | Status: AC
  Filled 2018-10-12: qty 4

## 2018-10-12 MED ORDER — AMLODIPINE BESYLATE 5 MG PO TABS
2.5000 mg | ORAL_TABLET | Freq: Every day | ORAL | Status: DC
  Administered 2018-10-12 – 2018-10-13 (×2): 2.5 mg via ORAL
  Filled 2018-10-12 (×2): qty 1

## 2018-10-12 MED ORDER — POTASSIUM CHLORIDE CRYS ER 20 MEQ PO TBCR: 20.0000 meq | EXTENDED_RELEASE_TABLET | Freq: Every day | ORAL | Status: DC | PRN

## 2018-10-12 MED ORDER — CHLORTHALIDONE 25 MG PO TABS
25.0000 mg | ORAL_TABLET | Freq: Every day | ORAL | Status: DC
  Administered 2018-10-13: 25 mg via ORAL
  Filled 2018-10-12: qty 1

## 2018-10-12 MED ORDER — FAMOTIDINE 20 MG PO TABS
ORAL_TABLET | ORAL | Status: AC
  Administered 2018-10-12: 20 mg via ORAL
  Filled 2018-10-12: qty 1

## 2018-10-12 MED ORDER — CLOPIDOGREL BISULFATE 75 MG PO TABS
75.0000 mg | ORAL_TABLET | Freq: Every day | ORAL | Status: DC
  Administered 2018-10-13 – 2018-10-15 (×3): 75 mg via ORAL
  Filled 2018-10-12 (×4): qty 1

## 2018-10-12 MED ORDER — CEFAZOLIN SODIUM-DEXTROSE 2-4 GM/100ML-% IV SOLN
INTRAVENOUS | Status: AC
  Administered 2018-10-12: 2 g via INTRAVENOUS
  Filled 2018-10-12: qty 100

## 2018-10-12 MED ORDER — SODIUM CHLORIDE 0.9 % IV SOLN
INTRAVENOUS | Status: DC
  Administered 2018-10-12 (×2): via INTRAVENOUS

## 2018-10-12 MED ORDER — HEPARIN (PORCINE) IN NACL 1000-0.9 UT/500ML-% IV SOLN
INTRAVENOUS | Status: AC
  Filled 2018-10-12: qty 1500

## 2018-10-12 MED ORDER — INSULIN ASPART 100 UNIT/ML ~~LOC~~ SOLN
0.0000 [IU] | Freq: Every day | SUBCUTANEOUS | Status: DC
  Administered 2018-10-12: 4 [IU] via SUBCUTANEOUS
  Filled 2018-10-12: qty 1

## 2018-10-12 MED ORDER — FENTANYL CITRATE (PF) 100 MCG/2ML IJ SOLN
INTRAMUSCULAR | Status: AC
  Administered 2018-10-12: 25 ug via INTRAVENOUS
  Filled 2018-10-12: qty 2

## 2018-10-12 MED ORDER — NITROGLYCERIN 0.4 MG SL SUBL: 0.4000 mg | SUBLINGUAL_TABLET | SUBLINGUAL | Status: DC | PRN

## 2018-10-12 MED ORDER — ENOXAPARIN SODIUM 30 MG/0.3ML ~~LOC~~ SOLN
30.0000 mg | SUBCUTANEOUS | Status: DC
  Administered 2018-10-13 – 2018-10-14 (×2): 30 mg via SUBCUTANEOUS
  Filled 2018-10-12 (×2): qty 0.3

## 2018-10-12 MED ORDER — FENTANYL CITRATE (PF) 100 MCG/2ML IJ SOLN
INTRAMUSCULAR | Status: DC | PRN
  Administered 2018-10-12 (×4): 50 ug via INTRAVENOUS

## 2018-10-12 MED ORDER — LIDOCAINE HCL (CARDIAC) PF 100 MG/5ML IV SOSY
PREFILLED_SYRINGE | INTRAVENOUS | Status: DC | PRN
  Administered 2018-10-12: 80 mg via INTRAVENOUS

## 2018-10-12 MED ORDER — FERROUS SULFATE 325 (65 FE) MG PO TABS
325.0000 mg | ORAL_TABLET | Freq: Every day | ORAL | Status: DC
  Administered 2018-10-13 – 2018-10-15 (×3): 325 mg via ORAL
  Filled 2018-10-12 (×3): qty 1

## 2018-10-12 MED ORDER — IOPAMIDOL (ISOVUE-300) INJECTION 61%
INTRAVENOUS | Status: DC | PRN
  Administered 2018-10-12: 50 mL

## 2018-10-12 MED ORDER — ROCURONIUM BROMIDE 100 MG/10ML IV SOLN
INTRAVENOUS | Status: DC | PRN
  Administered 2018-10-12: 10 mg via INTRAVENOUS
  Administered 2018-10-12: 40 mg via INTRAVENOUS
  Administered 2018-10-12: 20 mg via INTRAVENOUS

## 2018-10-12 MED ORDER — PHENOL 1.4 % MT LIQD
1.0000 | OROMUCOSAL | Status: DC | PRN
  Filled 2018-10-12: qty 177

## 2018-10-12 MED ORDER — PROPOFOL 10 MG/ML IV BOLUS
INTRAVENOUS | Status: AC
  Filled 2018-10-12: qty 20

## 2018-10-12 MED ORDER — EVICEL 2 ML EX KIT
PACK | CUTANEOUS | Status: DC | PRN
  Administered 2018-10-12: 2 mL

## 2018-10-12 MED ORDER — ASPIRIN EC 81 MG PO TBEC
81.0000 mg | DELAYED_RELEASE_TABLET | Freq: Every day | ORAL | Status: DC
  Administered 2018-10-12 – 2018-10-15 (×4): 81 mg via ORAL
  Filled 2018-10-12 (×4): qty 1

## 2018-10-12 SURGICAL SUPPLY — 95 items
APPLIER CLIP 11 MED OPEN (CLIP)
APPLIER CLIP 9.375 SM OPEN (CLIP)
BAG COUNTER SPONGE EZ (MISCELLANEOUS) ×2 IMPLANT
BAG DECANTER FOR FLEXI CONT (MISCELLANEOUS) ×2 IMPLANT
BAG ISOLATATION DRAPE 20X20 ST (DRAPES) IMPLANT
BLADE SURG 15 STRL LF DISP TIS (BLADE) ×1 IMPLANT
BLADE SURG 15 STRL SS (BLADE) ×1
BLADE SURG SZ11 CARB STEEL (BLADE) ×2 IMPLANT
BOOT SUTURE AID YELLOW STND (SUTURE) ×4 IMPLANT
BRUSH SCRUB EZ  4% CHG (MISCELLANEOUS) ×1
BRUSH SCRUB EZ 4% CHG (MISCELLANEOUS) ×1 IMPLANT
CANISTER SUCT 1200ML W/VALVE (MISCELLANEOUS) ×2 IMPLANT
CLIP APPLIE 11 MED OPEN (CLIP) IMPLANT
CLIP APPLIE 9.375 SM OPEN (CLIP) IMPLANT
COVER WAND RF STERILE (DRAPES) ×2 IMPLANT
DERMABOND ADVANCED (GAUZE/BANDAGES/DRESSINGS) ×1
DERMABOND ADVANCED .7 DNX12 (GAUZE/BANDAGES/DRESSINGS) ×1 IMPLANT
DRAPE INCISE IOBAN 66X45 STRL (DRAPES) ×4 IMPLANT
DRAPE ISOLATE BAG 20X20 STRL (DRAPES)
DRAPE SHEET LG 3/4 BI-LAMINATE (DRAPES) ×4 IMPLANT
DRESSING SURGICEL FIBRLLR 1X2 (HEMOSTASIS) ×1 IMPLANT
DRSG SURGICEL FIBRILLAR 1X2 (HEMOSTASIS) ×2
DRSG TEGADERM 4X10 (GAUZE/BANDAGES/DRESSINGS) IMPLANT
DRSG TEGADERM 4X4.75 (GAUZE/BANDAGES/DRESSINGS) IMPLANT
DRSG TELFA 3X8 NADH (GAUZE/BANDAGES/DRESSINGS) IMPLANT
DURAPREP 26ML APPLICATOR (WOUND CARE) ×4 IMPLANT
ELECT CAUTERY BLADE 6.4 (BLADE) ×4 IMPLANT
ELECT REM PT RETURN 9FT ADLT (ELECTROSURGICAL) ×2
ELECTRODE REM PT RTRN 9FT ADLT (ELECTROSURGICAL) ×1 IMPLANT
GAUZE 4X4 16PLY RFD (DISPOSABLE) IMPLANT
GEL ULTRASOUND 20GR AQUASONIC (MISCELLANEOUS) IMPLANT
GLOVE BIO SURGEON STRL SZ7 (GLOVE) ×4 IMPLANT
GLOVE INDICATOR 7.5 STRL GRN (GLOVE) ×4 IMPLANT
GLOVE SURG SYN 8.0 (GLOVE) ×6 IMPLANT
GOWN STRL REUS W/ TWL LRG LVL3 (GOWN DISPOSABLE) ×2 IMPLANT
GOWN STRL REUS W/ TWL XL LVL3 (GOWN DISPOSABLE) ×4 IMPLANT
GOWN STRL REUS W/TWL LRG LVL3 (GOWN DISPOSABLE) ×2
GOWN STRL REUS W/TWL XL LVL3 (GOWN DISPOSABLE) ×4
HEAD CUTTING 'VALVULOTOME URSL (MISCELLANEOUS) IMPLANT
IV NS 500ML (IV SOLUTION) ×1
IV NS 500ML BAXH (IV SOLUTION) ×1 IMPLANT
KIT PREVENA INCISION MGT 13 (CANNISTER) ×2 IMPLANT
KIT TURNOVER KIT A (KITS) ×2 IMPLANT
LABEL OR SOLS (LABEL) ×2 IMPLANT
LOOP RED MAXI  1X406MM (MISCELLANEOUS) ×3
LOOP VESSEL MAXI 1X406 RED (MISCELLANEOUS) ×3 IMPLANT
LOOP VESSEL MINI 0.8X406 BLUE (MISCELLANEOUS) ×2 IMPLANT
LOOPS BLUE MINI 0.8X406MM (MISCELLANEOUS) ×2
NEEDLE FILTER BLUNT 18X 1/2SAF (NEEDLE) ×1
NEEDLE FILTER BLUNT 18X1 1/2 (NEEDLE) ×1 IMPLANT
NEEDLE HYPO 18GX1.5 BLUNT FILL (NEEDLE) ×2 IMPLANT
NS IRRIG 1000ML POUR BTL (IV SOLUTION) ×2 IMPLANT
NS IRRIG 500ML POUR BTL (IV SOLUTION) ×2 IMPLANT
PACK BASIN MAJOR ARMC (MISCELLANEOUS) ×2 IMPLANT
PACK UNIVERSAL (MISCELLANEOUS) ×2 IMPLANT
PAD PREP 24X41 OB/GYN DISP (PERSONAL CARE ITEMS) ×2 IMPLANT
PATCH CAROTID ECM VASC 1X10 (Prosthesis & Implant Heart) ×2 IMPLANT
PENCIL ELECTRO HAND CTR (MISCELLANEOUS) IMPLANT
SPONGE LAP 18X18 RF (DISPOSABLE) IMPLANT
STAPLER SKIN PROX 35W (STAPLE) IMPLANT
STENT VIABAHN 6X100X120 (Permanent Stent) ×2 IMPLANT
STENT VIABAHN 6X250X120 (Permanent Stent) ×2 IMPLANT
SUT ETHIBOND CT1 BRD #0 30IN (SUTURE) IMPLANT
SUT ETHILON 4-0 (SUTURE) ×2
SUT ETHILON 4-0 FS2 18XMFL BLK (SUTURE) ×2
SUT MNCRL+ 5-0 UNDYED PC-3 (SUTURE) ×2 IMPLANT
SUT MONOCRYL 5-0 (SUTURE) ×2
SUT PROLENE 3 0 SH DA (SUTURE) IMPLANT
SUT PROLENE 5 0 RB 1 DA (SUTURE) ×4 IMPLANT
SUT PROLENE 6 0 BV (SUTURE) ×12 IMPLANT
SUT PROLENE 7 0 BV 1 (SUTURE) ×4 IMPLANT
SUT SILK 2 0 (SUTURE) ×1
SUT SILK 2 0 SH (SUTURE) ×2 IMPLANT
SUT SILK 2-0 18XBRD TIE 12 (SUTURE) ×1 IMPLANT
SUT SILK 3 0 (SUTURE) ×1
SUT SILK 3-0 18XBRD TIE 12 (SUTURE) ×1 IMPLANT
SUT SILK 4 0 (SUTURE) ×1
SUT SILK 4-0 18XBRD TIE 12 (SUTURE) ×1 IMPLANT
SUT VIC AB 0 CT1 36 (SUTURE) IMPLANT
SUT VIC AB 2-0 CT1 (SUTURE) IMPLANT
SUT VIC AB 2-0 CT1 27 (SUTURE) ×3
SUT VIC AB 2-0 CT1 TAPERPNT 27 (SUTURE) ×3 IMPLANT
SUT VIC AB 2-0 SH 27 (SUTURE)
SUT VIC AB 2-0 SH 27XBRD (SUTURE) IMPLANT
SUT VIC AB 3-0 SH 27 (SUTURE) ×1
SUT VIC AB 3-0 SH 27X BRD (SUTURE) ×1 IMPLANT
SUT VICRYL+ 3-0 36IN CT-1 (SUTURE) IMPLANT
SUTURE ETHLN 4-0 FS2 18XMF BLK (SUTURE) ×2 IMPLANT
SYR 20CC LL (SYRINGE) ×2 IMPLANT
SYR 3ML LL SCALE MARK (SYRINGE) ×2 IMPLANT
SYR 5ML LL (SYRINGE) IMPLANT
TAPE UMBIL 1/8X18 RADIOPA (MISCELLANEOUS) ×2 IMPLANT
TOWEL OR 17X26 4PK STRL BLUE (TOWEL DISPOSABLE) ×2 IMPLANT
TRAY FOLEY MTR SLVR 16FR STAT (SET/KITS/TRAYS/PACK) ×2 IMPLANT
VALVULOTOME URESIL (MISCELLANEOUS)

## 2018-10-12 NOTE — Op Note (Addendum)
OPERATIVE NOTE   PROCEDURE: 1. Left common femoral, profunda femoris, and superficial femoral artery endarterectomies and patch angioplasty 2.   Catheter placement into the left posterior tibial artery from left femoral approach 3.   Left lower extremity angiogram 4.   Viabahn stent placement x2 to the left SFA and proximal popliteal arteries with a 6 mm diameter by 25 cm length Viabahn stent and a 6 mm diameter by 10 cm length Viabahn stent 5.   Redo femoral exposure    PRE-OPERATIVE DIAGNOSIS: 1.Atherosclerotic occlusive disease left lower extremities with rest pain   POST-OPERATIVE DIAGNOSIS: Same  SURGEON: Leotis Pain, MD  CO-surgeon:Gregory Schnier, MD  ANESTHESIA: general  ESTIMATED BLOOD LOSS: 200 cc  FINDING(S): 1. significant plaque in left common femoral, profunda femoris, and superficial femoral arteries 2.  Left SFA occlusion with reconstitution of the above-knee popliteal artery and three-vessel runoff distally  SPECIMEN(S): Left common femoral, profunda femoris, and superficial femoral artery plaque.  INDICATIONS:  Patient presents with rest pain of the left foot.  Left femoral endarterectomy is planned to try to improve perfusion.  This is because of recurrent disease in the left common femoral artery and bifurcation vessels.  There is also along the left SFA occlusion, and the plan was to cross this occlusion intraoperatively and treat this with covered stents.  The risks and benefits as well as alternative therapies including intervention were reviewed in detail all questions were answered the patient agrees to proceed with surgery.  Co-surgeons were necessary due to the complexity of the case particularly given the redo nature of the femoral exposure and the technical difficulties of such an extensive case.  DESCRIPTION: After obtaining full informed written consent, the patient was brought back to the operating room and placed supine upon the operating  table. The patient received IV antibiotics prior to induction. After obtaining adequate anesthesia, the patient was prepped and draped in the standard fashion appropriate time out is called.   Vertical incision was created overlying the left femoral arteries. The common femoral artery proximally, and superficial femoral artery, and primary profunda femoris artery branches were encircled with vessel loops and prepared for control. The left femoral arteries were found to have significant plaque from the common femoral artery into the profunda and superficial femoral arteries.  Multiple profunda femoris branches were dissected out and encircled with Vesseloops.  The dissection was quite tedious due to the redo nature of the left groin.  The previous patch from the femoral endarterectomy was identified.   4000 units of heparin was given and allowed circulate for 5 minutes.   Attention is then turned to the left femoral artery. An arteriotomy is made with 11 blade and extended with Potts scissors in the common femoral artery and carried down onto the first 3 cm of the superficial femoral artery. An endarterectomy was then performed. The [facility] was used to create a plane. The proximal endpoint was cut flush with tenotomy scissors. This was in the proximal common femoral artery. An eversion endarterectomy was then performed for the first 2-3 cm of the profunda femoris artery with gentle traction and the elevator.  A nice feathered distal endpoint was seen when removed with gentle traction. Good backbleeding was then seen. The distal endpoint of the superficial femoral endarterectomy was created with gentle traction and the distal endpoint was fairly clean but we knew there was occlusion below this. The Cormatrix patcth is then selected and prepared for a patch angioplasty.  It is cut and  beveled and started at the proximal endpoint with a 6-0 Prolene suture.  Approximately one half of the suture line  is run medially and laterally and the distal end point was cut and bevelled to match the arteriotomy.  A second 6-0 Prolene was started at the distal end point and run to the mid portion to complete the arteriotomy.  The vessel was flushed prior to release of control and completion of the anastomosis.  At this point, flow was established first to the profunda femoris artery and then to the superficial femoral artery. Easily palpable pulses are noted well beyond the anastomosis and the profunda femoris artery but there remained occlusion in the superficial femoral artery just below her patch. Attention was then turned to revascularizing the SFA.  A 6 French sheath was placed just above the patch in the proximal common femoral artery.  A Glidewire and Kumpe catheter were then used to cross the SFA occlusion with no difficulty and intraluminal flow was confirmed in the below-knee popliteal artery.  We then exchanged for a 0.018 wire and advanced this into the posterior tibial artery with the help of the Kumpe catheter.  The diagnostic catheter was then removed.  Initially, the Viabahn stent would not track through the long segment occlusion so we predilated the lesion with a 4 mm diameter by 22 cm length Lutonix drug-coated angioplasty balloon inflated twice.  The first inflation was from the above-knee popliteal artery to the mid SFA and the second inflation was in the proximal superficial femoral artery.  Both inflations were 12 atm for a minute.  We then selected a 6 mm diameter by 25 cm Viabahn stent and deployed this from the above-knee popliteal artery up into a previously placed stent in the proximal to mid SFA.  A second 6 mm diameter via bond stent was then deployed with the most proximal extent at the distal edge of our patch and the most proximal SFA but below the profunda takeoff.  This was 10 cm in length.  These were postdilated with 5 mm diameter high-pressure balloon distally and 6 mm diameter  high-pressure balloon proximally.  Completion imaging showed only about a 20% residual stenosis in the profunda femoris artery.  The common femoral artery was widely patent.  The SFA was now widely patent with no greater than 10% residual stenosis in the distal runoff remained intact. Surgicel and Evicel topical hemostatic agents were placed in the femoral incision and hemostasis was complete. The femoral incision was then closed in a layered fashion with 2 layers of 2-0 Vicryl, 2 layers of 3-0 Vicryl, and interrupted Nylon for the skin closure. An incisional VAC was then placed on the groin incision as this was a redo surgery.  The patient was then awakened from anesthesia and taken to the recovery room in stable condition having tolerated the procedure well.  COMPLICATIONS: None  CONDITION: Stable     Leotis Pain 10/12/2018 12:31 PM   This note was created with Dragon Medical transcription system. Any errors in dictation are purely unintentional.

## 2018-10-12 NOTE — H&P (Signed)
[facility] History & Physical Update  The patient was interviewed and re-examined.  The patient's previous History and Physical has been reviewed and is unchanged.  There is no change in the plan of care. We plan to proceed with the scheduled procedure.  Hortencia Pilar, MD  10/12/2018, 7:24 AM

## 2018-10-12 NOTE — Anesthesia Preprocedure Evaluation (Signed)
Anesthesia Evaluation  Patient identified by MRN, date of birth, ID band Patient awake    Reviewed: Allergy & Precautions, H&P , NPO status , Patient's Chart, lab work & pertinent test results  History of Anesthesia Complications Negative for: history of anesthetic complications  Airway Mallampati: III  TM Distance: <3 FB Neck ROM: full    Dental  (+) Chipped, Partial Lower   Pulmonary neg shortness of breath, former smoker,           Cardiovascular Exercise Tolerance: Good hypertension, (-) angina+ CAD, + Past MI and + Peripheral Vascular Disease  (-) DOE (-) dysrhythmias + Valvular Problems/Murmurs      Neuro/Psych PSYCHIATRIC DISORDERS Anxiety TIA Neuromuscular disease CVA negative psych ROS   GI/Hepatic Neg liver ROS, GERD  Medicated and Controlled,  Endo/Other  diabetes, Type 2  Renal/GU Renal disease  negative genitourinary   Musculoskeletal   Abdominal   Peds  Hematology negative hematology ROS (+)   Anesthesia Other Findings Patient is NPO appropriate and reports no nausea or vomiting today.   Past Medical History: No date: Anxiety No date: Arthritis     Comment:  spine and shoulder No date: Cancer ([place])     Comment:  skin No date: Diabetes mellitus without complication (HCC) No date: Heart murmur No date: Hyperlipidemia No date: Hypertension No date: Mitral and aortic valve disease No date: Myocardial infarction [facility])     Comment:  may have had a "light" heart attack No date: Occasional tremors No date: Shingles     Comment:  patient unaware but daughter confirms. it was a long               time ago 01/2017: Stroke [facility])     Comment:  may have had a slight stroke 01/2017: TIA (transient ischemic attack) No date: UTI (urinary tract infection) No date: Vascular disease, peripheral ([place])  Past Surgical History: 1998: CARDIAC CATHETERIZATION     Comment:  40% LM, 95% Ramus interm No date:  CATARACT EXTRACTION, BILATERAL No date: EYE SURGERY; Bilateral     Comment:  cataract extractions 08/23/2017: LOWER EXTREMITY ANGIOGRAPHY; Left     Comment:  Procedure: Lower Extremity Angiography;  Surgeon: Algernon Huxley, MD;  Location: [place] [facility];  Service:               Cardiovascular;  Laterality: Left; 07/26/2018: LOWER EXTREMITY ANGIOGRAPHY; Left     Comment:  Procedure: LOWER EXTREMITY ANGIOGRAPHY;  Surgeon:               Katha Cabal, MD;  Location: [place] [facility];               Service: Cardiovascular;  Laterality: Left; 08/2013: PTCA     Comment:  Left common iliac 12/2012: PTCA     Comment:  left ext iliac No date: TUBAL LIGATION  BMI    Body Mass Index:  22.68 kg/m      Reproductive/Obstetrics negative OB ROS                             Anesthesia Physical  Anesthesia Plan  ASA: III  Anesthesia Plan: General   Post-op Pain Management:    Induction: Intravenous  PONV Risk Score and Plan: Ondansetron, Dexamethasone and Treatment may vary due to age or medical condition  Airway Management Planned: Oral  ETT and LMA  Additional Equipment:   Intra-op Plan:   Post-operative Plan: Extubation in OR  Informed Consent: I have reviewed the patients History and Physical, chart, labs and discussed the procedure including the risks, benefits and alternatives for the proposed anesthesia with the patient or authorized representative who has indicated his/her understanding and acceptance.   Dental Advisory Given  Plan Discussed with: Anesthesiologist, CRNA and Surgeon  Anesthesia Plan Comments: (Patient consented for risks of anesthesia including but not limited to:  - adverse reactions to medications - risk of intubation if required - damage to teeth, lips or other oral mucosa - sore throat or hoarseness - Damage to heart, brain, lungs or loss of life  Patient voiced understanding.)         Anesthesia Quick Evaluation

## 2018-10-12 NOTE — Anesthesia Procedure Notes (Signed)
Procedure Name: Intubation Date/Time: 10/12/2018 7:50 AM Performed by: Nelda Marseille, CRNA Pre-anesthesia Checklist: Patient identified, Patient being monitored, Timeout performed, Emergency Drugs available and Suction available Patient Re-evaluated:Patient Re-evaluated prior to induction Oxygen Delivery Method: Circle system utilized Preoxygenation: Pre-oxygenation with 100% oxygen Induction Type: IV induction Ventilation: Mask ventilation without difficulty Laryngoscope Size: Mac and 3 Grade View: Grade II Tube type: Oral Tube size: 7.0 mm Number of attempts: 1 Airway Equipment and Method: Stylet and Video-laryngoscopy Placement Confirmation: ETT inserted through vocal cords under direct vision,  positive ETCO2 and breath sounds checked- equal and bilateral Secured at: 21 cm Tube secured with: Tape Dental Injury: Teeth and Oropharynx as per pre-operative assessment  Difficulty Due To: Difficult Airway- due to anterior larynx

## 2018-10-12 NOTE — H&P (Signed)
[place] [facility] [place] NAME: Jill Hudson    MR#:  [id number]  DATE OF BIRTH:  [birth year]  DATE OF ADMISSION:  10/12/2018  PRIMARY CARE PHYSICIAN: [name], MD   REQUESTING/REFERRING PHYSICIAN: Jiles Crocker MD  CHIEF COMPLAINT:  No chief complaint on file.   HISTORY OF PRESENT ILLNESS: Jill Hudson  is a 83 y.o. female with a known history of osteoarthritis, diabetes type 2, essential hypertension, macular degeneration, peripheral arterial who underwent left common femoral, profunda femoral and superficial femoral artery endarterectomies and patch angioplasty.  Patient tolerated the procedure without any complications his complaint of pain in the left lower extremity.  Patient denies any chest pain or shortness of breath.   PAST MEDICAL HISTORY:   Past Medical History:  Diagnosis Date  . Allergies   . Anxiety   . Arthritis    spine and shoulder  . Cancer ([place])    skin  . Diabetes mellitus without complication ([place])   . Heart murmur   . Hyperlipidemia   . Hypertension   . Macula lutea degeneration   . Mitral and aortic valve disease   . Myocardial infarction [facility])    may have had a "light" heart attack  . Occasional tremors   . PAD (peripheral artery disease) ([place])   . Shingles    patient unaware but daughter confirms. it was a long time ago  . Stroke [facility]) 01/2017   may have had a slight stroke  . TIA (transient ischemic attack) 01/2017  . UTI (urinary tract infection)   . Vascular disease, peripheral ([place])     PAST SURGICAL HISTORY:  Past Surgical History:  Procedure Laterality Date  . CARDIAC CATHETERIZATION  1998   40% LM, 95% Ramus interm  . CATARACT EXTRACTION, BILATERAL    . COLONOSCOPY WITH PROPOFOL N/A 08/12/2018   Procedure: COLONOSCOPY WITH PROPOFOL;  Surgeon: Lucilla Lame, MD;  Location: [facility] ENDOSCOPY;  Service: Endoscopy;  Laterality: N/A;  . EYE SURGERY Bilateral    cataract extractions  . LOWER  EXTREMITY ANGIOGRAPHY Left 08/23/2017   Procedure: Lower Extremity Angiography;  Surgeon: Algernon Huxley, MD;  Location: [place] [facility];  Service: Cardiovascular;  Laterality: Left;  . LOWER EXTREMITY ANGIOGRAPHY Left 07/26/2018   Procedure: LOWER EXTREMITY ANGIOGRAPHY;  Surgeon: Katha Cabal, MD;  Location: [place] [facility];  Service: Cardiovascular;  Laterality: Left;  . LOWER EXTREMITY ANGIOGRAPHY Left 09/16/2018   Procedure: LOWER EXTREMITY ANGIOGRAPHY;  Surgeon: Katha Cabal, MD;  Location: [place] [facility] LAB;  Service: Cardiovascular;  Laterality: Left;  . PTCA  08/2013   Left common iliac  . PTCA  12/2012   left ext iliac  . TUBAL LIGATION      SOCIAL HISTORY:  Social History   Tobacco Use  . Smoking status: Former Smoker    Packs/day: 2.00    Years: 37.00    Pack years: 74.00    Types: Cigarettes    Last attempt to quit: 1980    Years since quitting: 39.9  . Smokeless tobacco: Never Used  . Tobacco comment: smoking cessation materials not required  Substance Use Topics  . Alcohol use: No    Alcohol/week: 0.0 standard drinks    FAMILY HISTORY:  Family History  Problem Relation Age of Onset  . Dementia Mother   . Diabetes Father     DRUG ALLERGIES:  Allergies  Allergen Reactions  . Saxagliptin Diarrhea  . Epinephrine Other (See Comments)  Patient does not remember what happens when she uses this  . Atorvastatin Other (See Comments)    Muscle aches  . Codeine Other (See Comments)    Upset stomach  . Ezetimibe Other (See Comments)    Myalgias(ZETIA)  . Limonene Rash    Patient does not recall this reaction  . Nitrofurantoin Rash and Other (See Comments)    Pruitus  . Sulfa Antibiotics Rash and Other (See Comments)    Sore mouth     REVIEW OF SYSTEMS:   CONSTITUTIONAL: No fever, fatigue or weakness.  EYES: No blurred or double vision.  EARS, NOSE, AND THROAT: No tinnitus or ear pain.  RESPIRATORY: No cough, shortness of breath,  wheezing or hemoptysis.  CARDIOVASCULAR: No chest pain, orthopnea, edema.  GASTROINTESTINAL: No nausea, vomiting, diarrhea or abdominal pain.  GENITOURINARY: No dysuria, hematuria.  ENDOCRINE: No polyuria, nocturia,  HEMATOLOGY: No anemia, easy bruising or bleeding SKIN: No rash or lesion. MUSCULOSKELETAL: No joint pain or arthritis.   NEUROLOGIC: No tingling, numbness, weakness.  PSYCHIATRY: No anxiety or depression.   MEDICATIONS AT HOME:  Prior to Admission medications   Medication Sig Start Date End Date Taking? Authorizing Provider  acetaminophen (TYLENOL) 650 MG CR tablet Take 1,300 mg by mouth every 8 (eight) hours as needed for pain.   Yes [provider]  amLODipine (NORVASC) 2.5 MG tablet Take 2.5 mg by mouth daily. 05/20/18  Yes [provider]  aspirin 81 MG tablet Take 81 mg by mouth daily.    Yes [provider]  B Complex Vitamins (VITAMIN-B COMPLEX) TABS Take 1 tablet by mouth daily.    Yes [provider]  baclofen (LIORESAL) 10 MG tablet TAKE 1 TABLET(10 MG) BY MOUTH THREE TIMES DAILY Patient taking differently: Take 10 mg by mouth at bedtime.  02/09/18  Yes [name], MD  Biotin 10 MG TABS Take 10 mg by mouth daily.    Yes [provider]  CALCIUM-MAGNESIUM-ZINC PO Take 1 tablet by mouth daily.    Yes [provider]  chlorthalidone (HYGROTON) 25 MG tablet Take 25 mg by mouth daily.   Yes [provider]  Cyanocobalamin (RA VITAMIN B-12 TR) 1000 MCG TBCR Take 1,000 mcg by mouth daily.    Yes [provider]  diltiazem (CARTIA XT) 180 MG 24 hr capsule Take 180 mg by mouth 2 (two) times daily.    Yes [provider]  ferrous sulfate 325 (65 FE) MG tablet Take 325 mg by mouth daily with breakfast.   Yes [provider]  insulin aspart (NOVOLOG FLEXPEN) 100 UNIT/ML FlexPen Inject 4 Units into the skin daily as needed. At lunch 03/26/17  Yes [provider]  Insulin Glargine  (BASAGLAR KWIKPEN) 100 UNIT/ML SOPN Inject 12 Units into the skin daily.   Yes [provider]  loratadine (CLARITIN) 10 MG tablet Take 10 mg by mouth daily as needed for allergies.    Yes [provider]  losartan (COZAAR) 50 MG tablet Take 50 mg by mouth 2 (two) times daily.  06/20/18  Yes [provider]  Multiple Vitamins-Minerals (MULTIVITAMIN PO) Take 1 tablet by mouth daily.   Yes [provider]  Multiple Vitamins-Minerals (PRESERVISION AREDS PO) Take 1 capsule by mouth 2 (two) times daily.    Yes [provider]  oxyCODONE-acetaminophen (PERCOCET) 5-325 MG tablet Take 1-2 tablets by mouth every 6 (six) hours as needed for moderate pain or severe pain. 09/16/18 09/16/19 Yes Schnier, Dolores Lory, MD  rosuvastatin (CRESTOR) 5 MG tablet Take 5 mg by mouth every other day.  03/02/18  Yes [provider]  clopidogrel (PLAVIX) 75 MG tablet Take 1 tablet (75 mg total) by mouth daily. 07/27/18   Schnier, Dolores Lory, MD  metFORMIN (GLUCOPHAGE-XR) 500 MG 24 hr tablet TAKE ONE TABLET BY MOUTH TWICE DAILY Patient taking differently: Take 500 mg by mouth 2 (two) times daily.  01/26/18   [name], MD  nitroGLYCERIN (NITROSTAT) 0.4 MG SL tablet 1 tab under the tongue for CP, may repeat in 5 minutes up to 3 doses 09/07/18   [name], MD      PHYSICAL EXAMINATION:   VITAL SIGNS: Blood pressure (!) 150/43, pulse 61, temperature 97.8 F (36.6 C), resp. rate 15, height 5\' 1"  (1.549 m), weight 55.3 kg, SpO2 98 %.  GENERAL:  83 y.o.-year-old patient lying in the bed with no acute distress.  EYES: Pupils equal, round, reactive to light and accommodation. No scleral icterus. Extraocular muscles intact.  HEENT: Head atraumatic, normocephalic. Oropharynx and nasopharynx clear.  NECK:  Supple, no jugular venous distention. No thyroid enlargement, no tenderness.  LUNGS: Normal breath sounds bilaterally, no wheezing, rales,rhonchi or crepitation. No  use of accessory muscles of respiration.  CARDIOVASCULAR: S1, S2 normal. No murmurs, rubs, or gallops.  ABDOMEN: Soft, nontender, nondistended. Bowel sounds present. No organomegaly or mass.  EXTREMITIES: No pedal edema, cyanosis, or clubbing.  NEUROLOGIC: Cranial nerves II through XII are intact. Muscle strength 5/5 in all extremities. Sensation intact. Gait not checked.  PSYCHIATRIC: The patient is alert and oriented x 3.  SKIN: No obvious rash, lesion, or ulcer.   LABORATORY PANEL:   CBC Recent Labs  Lab 10/06/18 1219  WBC 7.7  HGB 10.0*  HCT 30.9*  PLT 314  MCV 89.3  MCH 28.9  MCHC 32.4  RDW 13.1  LYMPHSABS 1.3  MONOABS 0.6  EOSABS 0.2  BASOSABS 0.1   ------------------------------------------------------------------------------------------------------------------  Chemistries  Recent Labs  Lab 10/06/18 1219  NA 129*  K 4.5  CL 95*  CO2 22  GLUCOSE 234*  BUN 31*  CREATININE 1.37*  CALCIUM 9.1   ------------------------------------------------------------------------------------------------------------------ estimated creatinine clearance is 24.3 mL/min (A) (by C-G formula based on SCr of 1.37 mg/dL (H)). ------------------------------------------------------------------------------------------------------------------ No results for input(s): TSH, T4TOTAL, T3FREE, THYROIDAB in the last 72 hours.  Invalid input(s): FREET3   Coagulation profile Recent Labs  Lab 10/06/18 1219  INR 1.09   ------------------------------------------------------------------------------------------------------------------- No results for input(s): DDIMER in the last 72 hours. -------------------------------------------------------------------------------------------------------------------  Cardiac Enzymes No results for input(s): CKMB, TROPONINI, MYOGLOBIN in the last 168 hours.  Invalid input(s):  CK ------------------------------------------------------------------------------------------------------------------ Invalid input(s): POCBNP  ---------------------------------------------------------------------------------------------------------------  Urinalysis    Component Value Date/Time   COLORURINE STRAW (A) 02/15/2017 1558   APPEARANCEUR CLEAR (A) 02/15/2017 1558   LABSPEC 1.006 02/15/2017 1558   PHURINE 7.0 02/15/2017 1558   GLUCOSEU NEGATIVE 02/15/2017 1558   HGBUR NEGATIVE 02/15/2017 1558   BILIRUBINUR neg 03/07/2018 1506   KETONESUR NEGATIVE 02/15/2017 1558   PROTEINUR 30 03/07/2018 1506   PROTEINUR 30 (A) 02/15/2017 1558   UROBILINOGEN 0.2 03/07/2018 1506   NITRITE neg 03/07/2018 1506   NITRITE NEGATIVE 02/15/2017 1558   LEUKOCYTESUR Moderate (2+) (A) 03/07/2018 1506     RADIOLOGY: Dg C-arm 1-60 Min-no Report  Result Date: 10/12/2018 Fluoroscopy was utilized by the requesting physician.  No radiographic interpretation.    EKG: Orders placed or performed during the hospital encounter of 10/06/18  . EKG test  .  EKG test    IMPRESSION AND PLAN: Patient is 83 year old with peripheral vascular disease  1.  Diabetes type 2 we will place patient on sliding scale insulin Creatinine is elevated at 1.37 if it gets worse Metformin is contraindicated We will place sliding scale insulin Continue Lantus  2.  Essential hypertension continue therapy with Norvasc and Cozaar  3.  Peripheral vascular disease continue Plavix  4. hyperlipidemia continue Crestor  5.  Chronic kidney disease monitor renal function   All the records are reviewed and case discussed with ED provider. Management plans discussed with the patient, family and they are in agreement.  CODE STATUS: Code Status History    Date Active Date Inactive Code Status Order ID Comments User Context   09/16/2018 1254 09/16/2018 2048 Full Code [id number]  Katha Cabal, MD Inpatient   08/11/2018  1706 08/13/2018 2136 Full Code [id number]  Gorden Harms, MD Inpatient   07/26/2018 0921 07/26/2018 1652 Full Code [id number]  Katha Cabal, MD Inpatient   08/23/2017 0935 08/23/2017 1419 Full Code [id number]  Algernon Huxley, MD Inpatient   02/15/2017 1538 02/16/2017 1959 Full Code [id number]  Dustin Flock, MD Inpatient       TOTAL TIME TAKING CARE OF THIS PATIENT: 50 minutes.    Dustin Flock M.D on 10/12/2018 at 3:44 PM  Between 7am to 6pm - Pager - [phone]  After 6pm go to [email or web address] - password [email or web address] [facility]  [phone]  CC: Primary care physician; [name], MD

## 2018-10-12 NOTE — Plan of Care (Signed)
No pain reported at this time. Stable vital signs. Schedule medications provided. PT/OT ordered for L knee weakness reported by patient. May need home health vs rehab.  Problem: Education: Goal: Knowledge of General Education information will improve Description Including pain rating scale, medication(s)/side effects and non-pharmacologic comfort measures Outcome: Progressing   Problem: Health Behavior/Discharge Planning: Goal: Ability to manage health-related needs will improve Outcome: Progressing   Problem: Clinical Measurements: Goal: Ability to maintain clinical measurements within normal limits will improve Outcome: Progressing Goal: Will remain free from infection Outcome: Progressing Goal: Diagnostic test results will improve Outcome: Progressing Goal: Respiratory complications will improve Outcome: Progressing Goal: Cardiovascular complication will be avoided Outcome: Progressing   Problem: Activity: Goal: Risk for activity intolerance will decrease Outcome: Progressing   Problem: Nutrition: Goal: Adequate nutrition will be maintained Outcome: Progressing   Problem: Coping: Goal: Level of anxiety will decrease Outcome: Progressing   Problem: Elimination: Goal: Will not experience complications related to bowel motility Outcome: Progressing Goal: Will not experience complications related to urinary retention Outcome: Progressing   Problem: Pain Managment: Goal: General experience of comfort will improve Outcome: Progressing   Problem: Safety: Goal: Ability to remain free from injury will improve Outcome: Progressing   Problem: Skin Integrity: Goal: Risk for impaired skin integrity will decrease Outcome: Progressing

## 2018-10-12 NOTE — Progress Notes (Signed)

## 2018-10-12 NOTE — Anesthesia Post-op Follow-up Note (Signed)
Anesthesia QCDR form completed.        

## 2018-10-12 NOTE — Transfer of Care (Signed)
Immediate Anesthesia Transfer of Care Note  Patient: Jill Hudson  Procedure(s) Performed: ENDARTERECTOMY FEMORAL (Left )  Patient Location: PACU  Anesthesia Type:General  Level of Consciousness: awake and sedated  Airway & Oxygen Therapy: Patient Spontanous Breathing and Patient connected to face mask oxygen  Post-op Assessment: Report given to RN and Post -op Vital signs reviewed and stable  Post vital signs: Reviewed and stable  Last Vitals:  Vitals Value Taken Time  BP 162/50 10/12/2018 11:33 AM  Temp    Pulse 55 10/12/2018 11:34 AM  Resp 15 10/12/2018 11:34 AM  SpO2 100 % 10/12/2018 11:34 AM  Vitals shown include unvalidated device data.  Last Pain:  Vitals:   10/12/18 0624  TempSrc: Oral         Complications: No apparent anesthesia complications

## 2018-10-12 NOTE — Op Note (Signed)
OPERATIVE NOTE   PROCEDURE: 1. Redo left common femoral, superficial femoral and profunda femoris endarterectomy with Cormatrix patch angioplasty 2. Left superficial femoral and above-knee popliteal artery angioplasty and stent placement  PRE-OPERATIVE DIAGNOSIS: Atherosclerotic occlusive disease left lower extremity with lifestyle limiting claudication and severe rest pain symptoms; diabetes mellitus  POST-OPERATIVE DIAGNOSIS: Same  CO-SURGEON: Katha Cabal, MD and Algernon Huxley, M.D.  ASSISTANT(S): None  ANESTHESIA: general  ESTIMATED BLOOD LOSS: 300 cc  FINDING(S): 1. Profound fibrotic plaque noted of the left common femoral extending past the initial bifurcation of the profunda femoris arteries as well as down the entire length of the SFA  SPECIMEN(S): Fibrotic plaque from the common femoral, superficial femoral and the profunda femoris artery  INDICATIONS:   Jill Hudson 83 y.o. y.o.female who presents with complaints of lifestyle limiting claudication and rest pain continuously in the left lower extremity. The patient has documented severe atherosclerotic occlusive disease and has undergone minimally invasive treatments in the past. However, at this point his primary area of stricture stenosis resides in the common femoral and origins of the superficial femoral and profunda femoris extending into these arteries and therefore this is not amenable to intervention. The patient is therefore undergoing open endarterectomy. The risks and benefits of surgery have been reviewed with the patient, all questions have answered; alternative therapies have been reviewed as well and the patient has agreed to proceed with surgical open repair.  DESCRIPTION: After obtaining full informed written consent, the patient was brought back to the operating room and placed supine upon the operating table.  The patient received IV antibiotics prior to  induction.  After obtaining adequate anesthesia, the patient was prepped and draped in the standard fashion for left femoral exposure.  Co-surgeons are required because this is a complicated redo procedure with work being performed simultaneously from both the patient's right and left sides.  This also expedites the procedure making a shorter operative time reducing complications and improving patient safety.  This is a redo femoral vascular procedure as she is already had reconstruction of her femoral on the left.  Attention was turned to the left groin with Dr. Lucky Cowboy working on the right and myself working on the left of the patient.  Vertical  Incision was made over the left common femoral artery and dissection carried down to the common femoral artery with electrocautery.  I dissected out the common femoral artery from the distal external iliac artery (identified by the superficial circumflex vessels) down to the femoral bifurcation.  On initial inspection, the common femoral artery was: densely calcified and there was no palpable pulse noted.    Subsequently the dissection was continued to include all circumflex branches and the profunda femoral artery and superficial femoral artery. The superficial femoral artery was dissected circumferentially for a distance of approximately 3-4 cm and the profunda femoris was dissected circumferentially out to the fourth order branches individual vessel loops were placed around each branch.  Control of all branches was obtained with vessel loops.  A softer area in the distal external iliac artery amendable to clamping was identified.    The patient was given 5000 units of Heparin intravenously, which was a therapeutic bolus.   After waiting 3 minutes, the distal external iliac artery was clamped and all of the vessel loops were placed under tension.  Arteriotomy was made in the common femoral artery with a 11-blade and extended it with a Potts scissor proximally and  distally extending the distal  end down the SFA for approximately 3 cm.   Endarterectomy was then performed under direct visualization using a freer elevator and a right angle from the mid common femoral extending up both proximally and distally. Proximally the endarterectomy was brought up to the level of the clamp where a clean edge was obtained. Distally the endarterectomy was carried down to a soft spot in the SFA where a feathered edge would was obtained.  7-0 Prolene interrupted tacking sutures were placed to secure the leading edge of the plaque in the SFA.  The profunda femoris was treated with an eversion technique extending endarterectomy approximately 2 cm distally again obtaining a featheredge on both sides right and left.   At this point, a corematrix patch was fashioned for the geometry of the arteriotomy.  The patch was sewn to the artery with 2 running stitches of 6-0 Prolene, running from each end.  Prior to completing the patch angioplasty, the profunda femoral artery was flushed as was the superficial femoral artery. The system was then forward flushed. The endarterectomy site was then irrigated copiously with heparinized saline. The patch angioplasty was completed in the usual fashion.  Flow was then reestablished first to the profunda femoris and then the superficial femoral artery. Any gaps or bleeding sites in the suture line were easily controlled with a 6-0 Prolene suture.   At this point the C arm was brought into the surgical field.  An [employer] retractor was used to retract the ileal inguinal ligament in a craniad direction.  A Seldinger needle was inserted into the anterior wall of the distal external iliac artery.  The J-wire was then advanced followed by placement of a 6 French sheath.  Hand-injection of contrast demonstrated the femoral anatomy.  The femoral reconstruction is noted to be widely patent there is no evidence of hemodynamically significant residual stenosis  within the common femoral or the profunda femoris.  The superficial femoral artery is now patent for the first 3 cm or so and then occludes.  This is essentially the preoperative baseline with an occluded SFA however by doing the endarterectomy of the proximal superficial femoral artery and the origin of the superficial femoral artery we have now established a viable landing zone for a stent.  Using a Kumpe catheter and a Glidewire the occluded SFA and above-knee popliteal are negotiated.  Hand-injection of contrast through the Kumpe demonstrates patency of the mid and distal popliteal with patency of the trifurcation.  Anterior tibial is the dominant runoff to the foot.  Predilatation is then performed with a 4 mm balloon.  V 18 wire is introduced and a 6 mm x 250 mm via bond stent was deployed with its distal edge at the level of the patella within the mid popliteal.  A second 6 mm x 100 mm via bond stent was then deployed with its proximal edge now approximating the origin of the native SFA.  The distal portion was postdilated with a 5 mm Dorado balloon inflated to 16 atm for 30 seconds.  The proximal portion of the stent was postdilated with a 6 mm Dorado balloon inflated to 20 atm for 30 seconds.  Follow-up imaging was now performed by hand-injection.  Interpretation: Initial images demonstrated wide patency of the common femoral and profunda femoris status post endarterectomy.  This angiogram shows successful treatment of the common femoral and profunda femoris.  The SFA as noted above we have now successfully created a viable landing zone.  However distal to the endarterectomy  site the SFA remains occluded.  The below-knee popliteal and trifurcation are patent.  Following angioplasty and stent placement there is now less than 5% residual stenosis with full expansion of both via bond stents.  Below-knee popliteal and trifurcation are preserved and patent down to the foot.  The left groin was then  irrigated copiously with sterile saline and subsequently Evicel and Surgicel were placed in the wound. The incision was repaired with a double layer of 2-0 Vicryl, a double layer of 3-0 Vicryl, and a layer of 4-0 nylon interrupted mattress sutures are used to close skin.  The skin was cleaned, dried, and Praveena VAC was placed.  COMPLICATIONS: None  CONDITION: Jill Hudson, M.D. [facility] Office: [phone]  10/12/2018, 11:42 AM

## 2018-10-12 NOTE — Progress Notes (Signed)
Anticoagulation monitoring(Lovenox):   83 yo female ordered Lovenox 40 mg Q24h  Filed Weights   10/12/18 0624  Weight: 121 lb 14.6 oz (55.3 kg)   BMI    Lab Results  Component Value Date   CREATININE 1.37 (H) 10/06/2018   CREATININE 1.25 (H) 09/15/2018   CREATININE 1.25 (H) 08/11/2018   Estimated Creatinine Clearance: 24.3 mL/min (A) (by C-G formula based on SCr of 1.37 mg/dL (H)). Hemoglobin & Hematocrit     Component Value Date/Time   HGB 10.0 (L) 10/06/2018 1219   HGB 8.9 (L) 08/23/2018 1534   HCT 30.9 (L) 10/06/2018 1219   HCT 27.4 (L) 08/23/2018 1534     Per Protocol for Patient with estCrcl < 30 ml/min and BMI < 40, will transition to Lovenox 30 mg Q24h.

## 2018-10-13 ENCOUNTER — Encounter: Payer: Self-pay | Admitting: Vascular Surgery

## 2018-10-13 DIAGNOSIS — Z9889 Other specified postprocedural states: Secondary | ICD-10-CM

## 2018-10-13 DIAGNOSIS — I70292 Other atherosclerosis of native arteries of extremities, left leg: Secondary | ICD-10-CM

## 2018-10-13 DIAGNOSIS — Z95820 Peripheral vascular angioplasty status with implants and grafts: Secondary | ICD-10-CM

## 2018-10-13 LAB — GLUCOSE, CAPILLARY
GLUCOSE-CAPILLARY: 205 mg/dL — AB (ref 70–99)
Glucose-Capillary: 200 mg/dL — ABNORMAL HIGH (ref 70–99)
Glucose-Capillary: 303 mg/dL — ABNORMAL HIGH (ref 70–99)
Glucose-Capillary: 166 mg/dL — ABNORMAL HIGH (ref 70–99)

## 2018-10-13 LAB — CBC
HCT: 25 % — ABNORMAL LOW (ref 36.0–46.0)
Hemoglobin: 8 g/dL — ABNORMAL LOW (ref 12.0–15.0)
MCH: 28.6 pg (ref 26.0–34.0)
MCHC: 32 g/dL (ref 30.0–36.0)
MCV: 89.3 fL (ref 80.0–100.0)
Platelets: 213 10*3/uL (ref 150–400)
RBC: 2.8 MIL/uL — ABNORMAL LOW (ref 3.87–5.11)
RDW: 12.7 % (ref 11.5–15.5)
WBC: 12.6 10*3/uL — ABNORMAL HIGH (ref 4.0–10.5)
nRBC: 0 % (ref 0.0–0.2)

## 2018-10-13 LAB — BASIC METABOLIC PANEL
Anion gap: 8 (ref 5–15)
BUN: 30 mg/dL — ABNORMAL HIGH (ref 8–23)
CO2: 18 mmol/L — ABNORMAL LOW (ref 22–32)
Calcium: 7.9 mg/dL — ABNORMAL LOW (ref 8.9–10.3)
Chloride: 101 mmol/L (ref 98–111)
Creatinine, Ser: 1.22 mg/dL — ABNORMAL HIGH (ref 0.44–1.00)
GFR calc Af Amer: 48 mL/min — ABNORMAL LOW (ref >60)
GFR calc non Af Amer: 42 mL/min — ABNORMAL LOW (ref >60)
Glucose, Bld: 292 mg/dL — ABNORMAL HIGH (ref 70–99)
Potassium: 4.7 mmol/L (ref 3.5–5.1)
SODIUM: 127 mmol/L — AB (ref 135–145)

## 2018-10-13 LAB — MAGNESIUM: MAGNESIUM: 2 mg/dL (ref 1.7–2.4)

## 2018-10-13 MED ORDER — MORPHINE SULFATE (PF) 2 MG/ML IV SOLN: 2.0000 mg | INTRAVENOUS | Status: DC | PRN

## 2018-10-13 MED ORDER — FAMOTIDINE 20 MG PO TABS
20.0000 mg | ORAL_TABLET | Freq: Every day | ORAL | Status: DC
  Administered 2018-10-14 – 2018-10-15 (×2): 20 mg via ORAL
  Filled 2018-10-13 (×2): qty 1

## 2018-10-13 MED ORDER — INSULIN ASPART 100 UNIT/ML FLEXPEN: 4.0000 [IU] | PEN_INJECTOR | Freq: Every day | SUBCUTANEOUS | Status: DC | PRN

## 2018-10-13 MED ORDER — BASAGLAR KWIKPEN 100 UNIT/ML ~~LOC~~ SOPN: 12.0000 [IU] | PEN_INJECTOR | Freq: Every day | SUBCUTANEOUS | Status: DC

## 2018-10-13 MED ORDER — GLUCERNA SHAKE PO LIQD
237.0000 mL | Freq: Three times a day (TID) | ORAL | Status: DC
  Administered 2018-10-13 – 2018-10-15 (×4): 237 mL via ORAL

## 2018-10-13 MED ORDER — METFORMIN HCL ER 500 MG PO TB24: 500.0000 mg | ORAL_TABLET | Freq: Two times a day (BID) | ORAL | Status: DC

## 2018-10-13 MED ORDER — SODIUM CHLORIDE 1 G PO TABS
2.0000 g | ORAL_TABLET | Freq: Two times a day (BID) | ORAL | Status: AC
  Administered 2018-10-13 – 2018-10-14 (×2): 2 g via ORAL
  Filled 2018-10-13 (×2): qty 2

## 2018-10-13 MED ORDER — INSULIN GLARGINE 100 UNIT/ML ~~LOC~~ SOLN
12.0000 [IU] | Freq: Every day | SUBCUTANEOUS | Status: DC
  Administered 2018-10-13 – 2018-10-15 (×3): 12 [IU] via SUBCUTANEOUS
  Filled 2018-10-13 (×3): qty 0.12

## 2018-10-13 MED ORDER — INSULIN ASPART 100 UNIT/ML ~~LOC~~ SOLN
3.0000 [IU] | Freq: Three times a day (TID) | SUBCUTANEOUS | Status: DC
  Administered 2018-10-13 – 2018-10-15 (×6): 3 [IU] via SUBCUTANEOUS
  Filled 2018-10-13 (×6): qty 1

## 2018-10-13 NOTE — Progress Notes (Signed)
[facility] [place] NAME: Jill Hudson    MR#:  [id number]  DATE OF BIRTH:  Mar 18, 1935  SUBJECTIVE:  Patient seen today Tolerating physical therapy okay Blood pressure was on the lower side this morning No complaints of any chest pain No shortness of breath Pain in the lower extremity left side better  REVIEW OF SYSTEMS:    ROS  CONSTITUTIONAL: No documented fever. No fatigue, weakness. No weight gain, no weight loss.  EYES: No blurry or double vision.  ENT: No tinnitus. No postnasal drip. No redness of the oropharynx.  RESPIRATORY: No cough, no wheeze, no hemoptysis. No dyspnea.  CARDIOVASCULAR: No chest pain. No orthopnea. No palpitations. No syncope.  GASTROINTESTINAL: No nausea, no vomiting or diarrhea. No abdominal pain. No melena or hematochezia.  GENITOURINARY: No dysuria or hematuria.  ENDOCRINE: No polyuria or nocturia. No heat or cold intolerance.  HEMATOLOGY: No anemia. No bruising. No bleeding.  INTEGUMENTARY: No rashes. No lesions.  MUSCULOSKELETAL: No arthritis. No swelling. No gout.  Decreased pain in the left lower extremity NEUROLOGIC: No numbness, tingling, or ataxia. No seizure-type activity.  PSYCHIATRIC: No anxiety. No insomnia. No ADD.   DRUG ALLERGIES:   Allergies  Allergen Reactions  . Saxagliptin Diarrhea  . Epinephrine Other (See Comments)    Patient does not remember what happens when she uses this  . Atorvastatin Other (See Comments)    Muscle aches  . Codeine Other (See Comments)    Upset stomach  . Ezetimibe Other (See Comments)    Myalgias(ZETIA)  . Limonene Rash    Patient does not recall this reaction  . Nitrofurantoin Rash and Other (See Comments)    Pruitus  . Sulfa Antibiotics Rash and Other (See Comments)    Sore mouth     VITALS:  Blood pressure (!) 133/49, pulse 70, temperature 97.9 F (36.6 C), temperature source Oral, resp. rate 18, height 5\' 1"  (1.549 m), weight 55.3 kg, SpO2 100  %.  PHYSICAL EXAMINATION:   Physical Exam  GENERAL:  83 y.o.-year-old patient lying in the bed with no acute distress.  EYES: Pupils equal, round, reactive to light and accommodation. No scleral icterus. Extraocular muscles intact.  HEENT: Head atraumatic, normocephalic. Oropharynx and nasopharynx clear.  NECK:  Supple, no jugular venous distention. No thyroid enlargement, no tenderness.  LUNGS: Normal breath sounds bilaterally, no wheezing, rales, rhonchi. No use of accessory muscles of respiration.  CARDIOVASCULAR: S1, S2 normal. No murmurs, rubs, or gallops.  ABDOMEN: Soft, nontender, nondistended. Bowel sounds present. No organomegaly or mass.  EXTREMITIES: No cyanosis, clubbing or edema b/l.    NEUROLOGIC: Cranial nerves II through XII are intact. No focal Motor or sensory deficits b/l.   PSYCHIATRIC: The patient is alert and oriented x 3.  SKIN: No obvious rash, lesion, or ulcer.   LABORATORY PANEL:   CBC Recent Labs  Lab 10/13/18 0003  WBC 12.6*  HGB 8.0*  HCT 25.0*  PLT 213   ------------------------------------------------------------------------------------------------------------------ Chemistries  Recent Labs  Lab 10/13/18 0003 10/13/18 0519  NA 127*  --   K 4.7  --   CL 101  --   CO2 18*  --   GLUCOSE 292*  --   BUN 30*  --   CREATININE 1.22*  --   CALCIUM 7.9*  --   MG  --  2.0   ------------------------------------------------------------------------------------------------------------------  Cardiac Enzymes No results for input(s): TROPONINI in the last 168 hours. ------------------------------------------------------------------------------------------------------------------  RADIOLOGY:  Dg C-arm  1-60 Min-no Report  Result Date: 10/12/2018 Fluoroscopy was utilized by the requesting physician.  No radiographic interpretation.     ASSESSMENT AND PLAN:  83 year old elderly female patient with history of peripheral vascular disease, type 2  diabetes mellitus, hypertension, chronic kidney disease under vascular surgery service  -Hypotension Blood pressure on the softer side Hold Norvasc and chlorthalidone Continue diltiazem Patient was on 2 calcium channel blockers  -Type 2 diabetes mellitus Metformin will be held Sliding-scale coverage with insulin along with Lantus insulin and diabetic diet  -Hypertension Continue Cozaar  -Hyperlipidemia Continue Crestor  -Peripheral vascular disease Continue Plavix  -Hyponatremia Discontinue chlorthalidone  - s/p left common femoral, profunda femoral and superficial femoral artery endarterectomies and patch angioplasty .  Vascular surgery follow-up.   All the records are reviewed and case discussed with Care Management/Social Worker. Management plans discussed with the patient, family and they are in agreement.  CODE STATUS: Full code  DVT Prophylaxis: SCDs  TOTAL TIME TAKING CARE OF THIS PATIENT: 35 minutes.   POSSIBLE D/C IN 2 to 3 DAYS, DEPENDING ON CLINICAL CONDITION.  Saundra Shelling M.D on 10/13/2018 at 1:54 PM  Between 7am to 6pm - Pager - [phone]  After 6pm go to [email or web address] - password [email or web address] [facility]  [facility]  Office  [phone]  CC: Primary care physician; [name], MD  Note: This dictation was prepared with Dragon dictation along with smaller phrase technology. Any transcriptional errors that result from this process are unintentional.

## 2018-10-13 NOTE — Evaluation (Signed)
Physical Therapy Evaluation Patient Details Name: Jill Hudson MRN: [id number] DOB: 09/17/1935 Today's Date: 10/13/2018   History of Present Illness  83 y.o. female with a known history of osteoarthritis, diabetes type 2, essential hypertension, macular degeneration, peripheral arterial who underwent left common femoral, profunda femoral and superficial femoral artery endarterectomies and patch angioplasty 12/18.  Clinical Impression  Pt did well with PT exam showing ability to ambulate ~250 ft with walker and negotiated up/down 4 steps.  Her O2 remained in the high 90s on room air t/o the effort and HR 80s-90s t/o.  She showed good overall tolerance apart from c/o L groin pain/soreness that did not limit her ability to participate with PT.  She states that she is walking better now POD1 than she was over the last few weeks.  Pt will need HHPT on d/c, but should be safe to return home.     Follow Up Recommendations Home health PT    Equipment Recommendations  None recommended by PT(pt has walker)    Recommendations for Other Services       Precautions / Restrictions Precautions Precautions: Fall Restrictions Weight Bearing Restrictions: No      Mobility  Bed Mobility Overal bed mobility: Modified Independent             General bed mobility comments: Pt was able to get her LEs to EOB w/o direct assist.  Transfers Overall transfer level: Modified independent Equipment used: Rolling walker (2 wheeled)             General transfer comment: Pt was able to rise to standing w/o direct assist, showed relatively good confidence  Ambulation/Gait Ambulation/Gait assistance: Min guard Gait Distance (Feet): 250 Feet Assistive device: Rolling walker (2 wheeled)       General Gait Details: Pt with some stiffness in L LE during gait with minimal hip and knee ROM, but overall did well with no safety issues and consistent speed/cadence.  Stairs Stairs: Yes Stairs  assistance: Min guard Stair Management: One rail Right;Sideways Number of Stairs: 4 General stair comments: Pt was able to negotiate up/down steps without direct assist.  Able to lead with either LE w/o pain limitations.   Wheelchair Mobility    Modified Rankin (Stroke Patients Only)       Balance Overall balance assessment: Independent                                           Pertinent Vitals/Pain Pain Assessment: 0-10 Pain Score: 2  Pain Location: groin/incision site is the only real pain area    Home Living Family/patient expects to be discharged to:: Private residence Living Arrangements: Spouse/significant other Available Help at Discharge: Family;Available PRN/intermittently(husband is phyiscally good but needs supervision, kids help)   Home Access: Stairs to enter Entrance Stairs-Rails: Right Entrance Stairs-Number of Steps: 5   Home Equipment: Walker - 2 wheels;Cane - single point      Prior Function Level of Independence: Independent         Comments: Pt reports that she has needed walker for the last ~month secondary to L LE pain.     Hand Dominance        Extremity/Trunk Assessment   Upper Extremity Assessment Upper Extremity Assessment: Overall WFL for tasks assessed    Lower Extremity Assessment Lower Extremity Assessment: Overall WFL for tasks assessed  Communication   Communication: No difficulties  Cognition Arousal/Alertness: Awake/alert Behavior During Therapy: WFL for tasks assessed/performed Overall Cognitive Status: Within Functional Limits for tasks assessed                                        General Comments      Exercises     Assessment/Plan    PT Assessment Patient needs continued PT services  PT Problem List Decreased strength;Decreased activity tolerance;Decreased balance;Decreased knowledge of use of DME;Decreased safety awareness;Decreased mobility;Pain       PT  Treatment Interventions Therapeutic exercise;Balance training;Functional mobility training;Stair training;DME instruction;Gait training;Therapeutic activities;Neuromuscular re-education;Patient/family education    PT Goals (Current goals can be found in the Care Plan section)  Acute Rehab PT Goals PT Goal Formulation: With patient Time For Goal Achievement: 10/27/18 Potential to Achieve Goals: Good    Frequency Min 2X/week   Barriers to discharge        Co-evaluation               AM-PAC PT "6 Clicks" Mobility  Outcome Measure Help needed turning from your back to your side while in a flat bed without using bedrails?: None Help needed moving from lying on your back to sitting on the side of a flat bed without using bedrails?: None Help needed moving to and from a bed to a chair (including a wheelchair)?: None Help needed standing up from a chair using your arms (e.g., wheelchair or bedside chair)?: A Little Help needed to walk in hospital room?: None Help needed climbing 3-5 steps with a railing? : A Little 6 Click Score: 22    End of Session Equipment Utilized During Treatment: Gait belt Activity Tolerance: Patient tolerated treatment well Patient left: with call bell/phone within reach;with chair alarm set Nurse Communication: Mobility status PT Visit Diagnosis: Muscle weakness (generalized) (M62.81);Difficulty in walking, not elsewhere classified (R26.2)    Time: 6579-0383 PT Time Calculation (min) (ACUTE ONLY): 28 min   Charges:   PT Evaluation $PT Eval Low Complexity: 1 Low PT Treatments $Gait Training: 8-22 mins        Kreg Shropshire, DPT 10/13/2018, 10:55 AM

## 2018-10-13 NOTE — Progress Notes (Signed)
[facility] Daily Progress Note   Subjective: 1 Day Post-Op: Redo left common femoral, superficial femoral and profunda femoris endarterectomy with Cormatrix patch angioplasty with left superficial femoral and above-knee popliteal artery angioplasty and stent placement  Patient working with PT this AM. Some soreness to the left thigh incision. Minimal left lower extremity pain. No issues overnight.   Objective: Vitals:   10/12/18 2120 10/12/18 2122 10/13/18 0215 10/13/18 0649  BP: (!) 127/37 (!) 142/38 (!) 130/41 (!) 136/55  Pulse: 62 61 74 73  Resp: 16  16 16   Temp: 98.3 F (36.8 C)  97.9 F (36.6 C) 98.3 F (36.8 C)  TempSrc: Oral  Oral Oral  SpO2: 99%  98% 98%  Weight:      Height:        Intake/Output Summary (Last 24 hours) at 10/13/2018 0934 Last data filed at 10/13/2018 9629 Gross per 24 hour  Intake 2224.57 ml  Output 2600 ml  Net -375.43 ml   Physical Exam: A&Ox3, NAD CV: RRR Pulmonary: CTA Bilaterally Abdomen: Soft, Nontender, Nondistended Left Groin: OR dressing intact. Clean and dry. No swelling or drainage. Vascular:  Left lower extremity: Thigh soft. Calf soft. Extremity warm down to feet. Palpable DP  pulse. Motor / sensory intact.    Laboratory: CBC    Component Value Date/Time   WBC 12.6 (H) 10/13/2018 0003   HGB 8.0 (L) 10/13/2018 0003   HGB 8.9 (L) 08/23/2018 1534   HCT 25.0 (L) 10/13/2018 0003   HCT 27.4 (L) 08/23/2018 1534   PLT 213 10/13/2018 0003   PLT 267 06/02/2017 1014   BMET    Component Value Date/Time   NA 127 (L) 10/13/2018 0003   NA 136 01/16/2016 1135   NA 138 05/29/2014 1202   K 4.7 10/13/2018 0003   K 5.1 05/29/2014 1202   CL 101 10/13/2018 0003   CL 106 05/29/2014 1202   CO2 18 (L) 10/13/2018 0003   CO2 25 05/29/2014 1202   GLUCOSE 292 (H) 10/13/2018 0003   GLUCOSE 269 (H) 05/29/2014 1202   BUN 30 (H) 10/13/2018 0003   BUN 19 01/16/2016 1135   BUN 19 (H) 05/29/2014 1202   CREATININE 1.22 (H)  10/13/2018 0003   CREATININE 1.10 05/29/2014 1202   CALCIUM 7.9 (L) 10/13/2018 0003   CALCIUM 9.1 05/29/2014 1202   GFRNONAA 42 (L) 10/13/2018 0003   GFRNONAA 48 (L) 05/29/2014 1202   GFRAA 48 (L) 10/13/2018 0003   GFRAA 56 (L) 05/29/2014 1202   Assessment/Planning: The patient is an 83 year old female with severe PAD s/p redo left common femoral, superficial femoral and profunda femoris endarterectomy with Cormatrix patch angioplasty with left superficial femoral and above-knee popliteal artery angioplasty and stent placement - POD #1 1) Repleted Sodium 2) Drop in Hbg to 8.0 from 9.7, asymptomatic - Will follow AM labs 3) KVO fluids. Patient tolerating a regular diet 4) Restarted home diabetic medications with exception of glucophage which will start tomorrow due to use of contrast during surgery yesterday.  5) ASA / Plavix for PAD 6) Lovenox for DVT 7) Working with PT who recommended home with PT. 8) Will apply for home nursing services as well.   Discussed with Dr. Eber Hong Harlei Lehrmann PA-C 10/13/2018 9:34 AM

## 2018-10-13 NOTE — Progress Notes (Signed)
Initial Nutrition Assessment  DOCUMENTATION CODES:   Not applicable  INTERVENTION:   Glucerna Shake po TID, each supplement provides 220 kcal and 10 grams of protein  NUTRITION DIAGNOSIS:   Increased nutrient needs related to post-op healing as evidenced by increased estimated needs.  GOAL:   Patient will meet greater than or equal to 90% of their needs  MONITOR:   PO intake, Supplement acceptance, Labs, Weight trends, Skin, I & O's  REASON FOR ASSESSMENT:   Malnutrition Screening Tool    ASSESSMENT:   83 y.o. female with a known history of osteoarthritis, diabetes type 2, essential hypertension, macular degeneration, peripheral arterial who underwent left common femoral, profunda femoral and superficial femoral artery endarterectomies and patch angioplasty 12/18  Met with pt in room today. Pt reports good appetite and oral intake pta. Pt reports eating 100% of breakfast this morning. Pt reports drinking chocolate Glucerna at home and would like to have this here while in hospital. Per chart, pt is weight stable.   Medications reviewed and include: aspirin, plavix, colace, lovenox, insulin, metformin, rena-vite, B12, NaCl tabs, pepcid, Mg sulfate  Labs reviewed: Na 127(L), BUN 30(H), creat 1.22(H), Mg 2.0 wnl Wbc- 12.6(H), Hgb 8.0(L), Hct 25.0(L) cbgs- 241, 281, 298, 334, 200 x 24 hrs AIC- 7.8(H)  NUTRITION - FOCUSED PHYSICAL EXAM:    Most Recent Value  Orbital Region  No depletion  Upper Arm Region  Mild depletion  Thoracic and Lumbar Region  No depletion  Buccal Region  No depletion  Temple Region  No depletion  Clavicle Bone Region  Mild depletion  Clavicle and Acromion Bone Region  Mild depletion  Scapular Bone Region  No depletion  Dorsal Hand  No depletion  Patellar Region  Mild depletion  Anterior Thigh Region  Mild depletion  Posterior Calf Region  Mild depletion  Edema (RD Assessment)  None  Hair  Reviewed  Eyes  Reviewed  Mouth  Reviewed  Skin   Reviewed  Nails  Reviewed     Diet Order:   Diet Order            Diet Carb Modified Fluid consistency: Thin; Room service appropriate? Yes  Diet effective now             EDUCATION NEEDS:   Education needs have been addressed  Skin:  Skin Assessment: Reviewed RN Assessment(incision groin )  Last BM:  pta  Height:   Ht Readings from Last 1 Encounters:  10/12/18 '5\' 1"'  (1.549 m)    Weight:   Wt Readings from Last 1 Encounters:  10/12/18 55.3 kg    Ideal Body Weight:  47.7 kg  BMI:  Body mass index is 23.04 kg/m.  Estimated Nutritional Needs:   Kcal:  1300-1500kcal/day   Protein:  61-72g/day   Fluid:  1.2L/day   Koleen Distance MS, RD, LDN Pager #- [phone] Office#- [phone] After Hours Pager: [phone]

## 2018-10-13 NOTE — Anesthesia Postprocedure Evaluation (Signed)
Anesthesia Post Note  Patient: Jill Hudson  Procedure(s) Performed: ENDARTERECTOMY FEMORAL (Left )  Patient location during evaluation: PACU Anesthesia Type: General Level of consciousness: awake and alert Pain management: pain level controlled Vital Signs Assessment: post-procedure vital signs reviewed and stable Respiratory status: spontaneous breathing, nonlabored ventilation, respiratory function stable and patient connected to nasal cannula oxygen Cardiovascular status: blood pressure returned to baseline and stable Postop Assessment: no apparent nausea or vomiting Anesthetic complications: no     Last Vitals:  Vitals:   10/13/18 0649 10/13/18 1145  BP: (!) 136/55 (!) 133/49  Pulse: 73 70  Resp: 16 18  Temp: 36.8 C 36.6 C  SpO2: 98% 100%    Last Pain:  Vitals:   10/13/18 1145  TempSrc: Oral  PainSc:                  Martha Clan

## 2018-10-13 NOTE — Care Management (Signed)
Patient admitted with peripheral vascular disease.  Patient lives at home alone.  Patients states that she has children who live locally for support.  Prior to admission patient was able to driver her self, but states that she will be relying on transportation from her daughter. Pharmacy [facility] [place].  Patient denies issues with obtaining medications.  Patient states that she has RW, grab bars, and BSC in the home.  Patient is currently requiring acute O2.  PT has assessed patient and recommends home health PT.  Patient agreeable to home health services.  CMS Medicare.gov Compare Post Acute Care list reviewed with patient.  Patient states that she would like to use who she did previously.  RNCM reviewed previous records.  Patient has been open with [facility].  Heads up referral made to [facility] with [facility].  RNCM following for discharge needs.

## 2018-10-13 NOTE — Evaluation (Signed)
Occupational Therapy Evaluation Patient Details Name: Jill Hudson MRN: [id number] DOB: 08-May-1935 Today's Date: 10/13/2018    History of Present Illness 83 y.o. female with a known history of osteoarthritis, diabetes type 2, essential hypertension, macular degeneration, peripheral arterial who underwent left common femoral, profunda femoral and superficial femoral artery endarterectomies and patch angioplasty 12/18.   Clinical Impression   Pt is 83 year old female s/p L common femoral,profunda femoral and superficial femoral artery endarterectomies and patch angioplasty 12/18. Marland Kitchen  Pt was independent in all ADLs prior to surgery and caring for her husband with demenita and is eager to return to PLOF.  She is currently on 2L of O2 after PT session today and initially had low BP of 76/58 x2 readings and sats 88% and after deep breathing BP increased to 141/58 and O2 sats 100%.  NSG, Barnabas Lister, updated.  Pt currently requires min assist for LB dressing while in seated position due to pain in groin, incision site.  Pt would benefit from instruction in dressing techniques with or without assistive devices for dressing and bathing skills.  Pt would also benefit from education in energy conservation and deep breathing tech.  Rec OT HH after discharge.      Follow Up Recommendations  Home health OT    Equipment Recommendations       Recommendations for Other Services       Precautions / Restrictions Precautions Precautions: Fall Precaution Comments: had low BP and now on 2L O2 soon after PT session   Restrictions Weight Bearing Restrictions: No      Mobility Bed Mobility Overal bed mobility: Modified Independent             General bed mobility comments: Pt was able to get her LEs to EOB w/o direct assist.  Transfers Overall transfer level: Modified independent Equipment used: Rolling walker (2 wheeled)             General transfer comment: Pt was able to rise to standing w/o  direct assist, showed relatively good confidence    Balance Overall balance assessment: Independent                                         ADL either performed or assessed with clinical judgement   ADL Overall ADL's : Needs assistance/impaired Eating/Feeding: Independent;Set up   Grooming: Wash/dry hands;Wash/dry face;Oral care;Applying deodorant;Brushing hair;Independent;Set up Grooming Details (indicate cue type and reason): needed rest breaks and is now on 2L of O2 nasal cannula after getting up and walking in hallway with PT stating this was the most activity she has done in a long time         Upper Body Dressing : Independent;Set up   Lower Body Dressing: Minimal assistance;Set up Lower Body Dressing Details (indicate cue type and reason): no standing for pants over hips due to low BP at beginning of 76/58 and pulse 80 and O2 sats 88%.               Functional mobility during ADLs: Rolling walker;Minimal assistance General ADL Comments: PT reported mod Independent with RW and overall did well with balance.     Vision Baseline Vision/History: Wears glasses Wears Glasses: At all times Patient Visual Report: No change from baseline       Perception     Praxis      Pertinent Vitals/Pain Pain Assessment: 0-10  Pain Score: 2  Pain Location: groin/incision site Pain Descriptors / Indicators: Constant;Aching;Discomfort Pain Intervention(s): Limited activity within patient's tolerance;Monitored during session;Premedicated before session     Hand Dominance Right   Extremity/Trunk Assessment Upper Extremity Assessment Upper Extremity Assessment: Overall WFL for tasks assessed   Lower Extremity Assessment Lower Extremity Assessment: Defer to PT evaluation       Communication Communication Communication: No difficulties   Cognition Arousal/Alertness: Awake/alert Behavior During Therapy: WFL for tasks assessed/performed Overall Cognitive  Status: Within Functional Limits for tasks assessed                                     General Comments       Exercises     Shoulder Instructions      Home Living Family/patient expects to be discharged to:: Private residence Living Arrangements: (pt was caring for husband with dementia) Available Help at Discharge: Family;Available PRN/intermittently Type of Home: House Home Access: Stairs to enter [employer] of Steps: 5 Entrance Stairs-Rails: Right Home Layout: Two level;Able to live on main level with bedroom/bathroom     Bathroom Shower/Tub: Occupational psychologist: Handicapped height Bathroom Accessibility: Yes   Home Equipment: Environmental consultant - 2 wheels;Cane - single point;Shower seat;Grab bars - toilet;Grab bars - tub/shower          Prior Functioning/Environment Level of Independence: Independent        Comments: Pt reports that she has needed walker for the last ~month secondary to L LE pain and was taking care of her husband with dementia        OT Problem List: Decreased strength;Pain;Decreased activity tolerance      OT Treatment/Interventions: Self-care/ADL training;Therapeutic activities;Balance training;Energy conservation;Patient/family education;Therapeutic exercise    OT Goals(Current goals can be found in the care plan section) Acute Rehab OT Goals Patient Stated Goal: "to regain ability to care for myself and my husband" OT Goal Formulation: With patient Time For Goal Achievement: 10/27/18 Potential to Achieve Goals: Good ADL Goals Pt Will Perform Lower Body Dressing: with set-up;with min assist;with adaptive equipment;sit to/from stand Pt Will Transfer to Toilet: with supervision;stand pivot transfer;regular height toilet;ambulating;grab bars  OT Frequency: Min 1X/week   Barriers to D/C:    pt was taking care of her husband at home with hx of dementia        Co-evaluation              AM-PAC OT "6  Clicks" Daily Activity     Outcome Measure Help from another person eating meals?: None Help from another person taking care of personal grooming?: None Help from another person toileting, which includes using toliet, bedpan, or urinal?: A Little Help from another person bathing (including washing, rinsing, drying)?: A Little Help from another person to put on and taking off regular upper body clothing?: None Help from another person to put on and taking off regular lower body clothing?: A Little 6 Click Score: 21   End of Session Nurse Communication: Other (comment)(low BP at first of 76/58 with sats 88% and then when retaken after pursed lip breathing it was 141/58 and O2 sats 100%)  Activity Tolerance: Patient limited by fatigue;Other (comment)(limited session due to low BP of 76/58 initially and short of breath and recently put on 2L of O2 nasal cannula) Patient left: with call bell/phone within reach;with bed alarm set  OT Visit Diagnosis: Muscle weakness (generalized) (  M62.81);Unsteadiness on feet (R26.81);Pain Pain - Right/Left: Left Pain - part of body: (groin area at incision site)                Time: 4090-5025 OT Time Calculation (min): 43 min Charges:  OT General Charges $OT Visit: 1 Visit OT Evaluation $OT Eval Moderate Complexity: 1 Mod OT Treatments $Self Care/Home Management : 23-37 mins  Chrys Racer, OTR/L ascom [phone] 10/13/18, 11:51 AM

## 2018-10-13 NOTE — Progress Notes (Signed)
Inpatient Diabetes Program Recommendations  AACE/ADA: New Consensus Statement on Inpatient Glycemic Control (2015)  Target Ranges:  Prepandial:   less than 140 mg/dL      Peak postprandial:   less than 180 mg/dL (1-2 hours)      Critically ill patients:  140 - 180 mg/dL   Lab Results  Component Value Date   GLUCAP 200 (H) 10/13/2018   HGBA1C 7.8 (H) 08/11/2018    Review of Glycemic Control Results for Jill Hudson, Jill Hudson (MRN [id number]) as of 10/13/2018 10:16  Ref. Range 10/12/2018 12:08 10/12/2018 15:57 10/12/2018 17:00 10/12/2018 21:10 10/13/2018 07:49  Glucose-Capillary Latest Ref Range: 70 - 99 mg/dL 241 (H) 281 (H) 298 (H) 334 (H) 200 (H)   Diabetes history: DM 2 Outpatient Diabetes medications:  Basaglar 12 units daily, Novolog 4 units with lunch, Metformin 500 mg bid Current orders for Inpatient glycemic control:  Basaglar 12 units daily, Novolog sensitive tid with meals and HS Inpatient Diabetes Program Recommendations:   Note blood sugars increased on 12/18.  Home basal insulin has been resumed. May consider adding Novolog meal coverage 3 units tid with meals (hold if patient eats less than 50%).   Thanks  Adah Perl, RN, BC-ADM Inpatient Diabetes Coordinator Pager [phone] (8a-5p)

## 2018-10-14 LAB — CBC
HCT: 24.4 % — ABNORMAL LOW (ref 36.0–46.0)
Hemoglobin: 7.7 g/dL — ABNORMAL LOW (ref 12.0–15.0)
MCH: 28.3 pg (ref 26.0–34.0)
MCHC: 31.6 g/dL (ref 30.0–36.0)
MCV: 89.7 fL (ref 80.0–100.0)
Platelets: 246 10*3/uL (ref 150–400)
RBC: 2.72 MIL/uL — ABNORMAL LOW (ref 3.87–5.11)
RDW: 13.2 % (ref 11.5–15.5)
WBC: 12.3 10*3/uL — ABNORMAL HIGH (ref 4.0–10.5)
nRBC: 0 % (ref 0.0–0.2)

## 2018-10-14 LAB — GLUCOSE, CAPILLARY
Glucose-Capillary: 142 mg/dL — ABNORMAL HIGH (ref 70–99)
Glucose-Capillary: 115 mg/dL — ABNORMAL HIGH (ref 70–99)
Glucose-Capillary: 195 mg/dL — ABNORMAL HIGH (ref 70–99)
Glucose-Capillary: 183 mg/dL — ABNORMAL HIGH (ref 70–99)
Glucose-Capillary: 107 mg/dL — ABNORMAL HIGH (ref 70–99)

## 2018-10-14 LAB — BASIC METABOLIC PANEL
Anion gap: 7 (ref 5–15)
BUN: 31 mg/dL — ABNORMAL HIGH (ref 8–23)
CO2: 21 mmol/L — ABNORMAL LOW (ref 22–32)
Calcium: 8.7 mg/dL — ABNORMAL LOW (ref 8.9–10.3)
Chloride: 109 mmol/L (ref 98–111)
Creatinine, Ser: 1.03 mg/dL — ABNORMAL HIGH (ref 0.44–1.00)
GFR calc non Af Amer: 51 mL/min — ABNORMAL LOW (ref >60)
GFR, EST AFRICAN AMERICAN: 59 mL/min — AB (ref >60)
Glucose, Bld: 153 mg/dL — ABNORMAL HIGH (ref 70–99)
Potassium: 4.1 mmol/L (ref 3.5–5.1)
Sodium: 137 mmol/L (ref 135–145)

## 2018-10-14 LAB — MAGNESIUM: Magnesium: 2 mg/dL (ref 1.7–2.4)

## 2018-10-14 LAB — SURGICAL PATHOLOGY

## 2018-10-14 MED ORDER — DILTIAZEM HCL ER COATED BEADS 120 MG PO CP24
120.0000 mg | ORAL_CAPSULE | Freq: Two times a day (BID) | ORAL | Status: DC
  Administered 2018-10-14 – 2018-10-15 (×2): 120 mg via ORAL
  Filled 2018-10-14 (×2): qty 1

## 2018-10-14 MED ORDER — ENOXAPARIN SODIUM 40 MG/0.4ML ~~LOC~~ SOLN
40.0000 mg | SUBCUTANEOUS | Status: DC
  Administered 2018-10-15: 40 mg via SUBCUTANEOUS
  Filled 2018-10-14: qty 0.4

## 2018-10-14 NOTE — Progress Notes (Signed)
Occupational Therapy Treatment Patient Details Name: Jill Hudson MRN: [id number] DOB: 1935-10-20 Today's Date: 10/14/2018    History of present illness 83 y.o. female with a known history of osteoarthritis, diabetes type 2, essential hypertension, macular degeneration, peripheral arterial who underwent left common femoral, profunda femoral and superficial femoral artery endarterectomies and patch angioplasty 12/18.   OT comments  Pt. education with provided about A/E use for LE ADL. Pt. requires minA for LE ADLs. Pt. education was provided about work simplification strategies home ADLs, and home management tasks. Pt. continues to benefit from OT services for ADL training, A/E training, and pt. education about work simplification techniques, home modification, and DME.   Follow Up Recommendations  Home health OT    Equipment Recommendations       Recommendations for Other Services      Precautions / Restrictions Precautions Precautions: Fall Precaution Comments: had low BP and now on 2L O2 soon after PT session   Restrictions Weight Bearing Restrictions: No       Mobility Bed Mobility                  Transfers                 General transfer comment: Pt. seen while up in the recliner chair    Balance                                           ADL either performed or assessed with clinical judgement   ADL Overall ADL's : Needs assistance/impaired Eating/Feeding: Independent;Set up   Grooming: Set up;Independent           Upper Body Dressing : Set up;Independent   Lower Body Dressing: Minimal assistance;Set up                 General ADL Comments: Pt. edcuation was provided about A/E use for LE ADLs.     Vision Baseline Vision/History: Wears glasses Wears Glasses: At all times Patient Visual Report: No change from baseline     Perception     Praxis      Cognition Arousal/Alertness: Awake/alert Behavior During  Therapy: WFL for tasks assessed/performed Overall Cognitive Status: Within Functional Limits for tasks assessed                                          Exercises     Shoulder Instructions       General Comments      Pertinent Vitals/ Pain       Pain Assessment: 0-10 Pain Score: 2  Pain Location: groin/incision site Pain Descriptors / Indicators: Constant;Aching;Discomfort Pain Intervention(s): Limited activity within patient's tolerance;Monitored during session;Premedicated before session  Home Living                                          Prior Functioning/Environment              Frequency  Min 1X/week        Progress Toward Goals  OT Goals(current goals can now be found in the care plan section)        Plan  Co-evaluation                 AM-PAC OT "6 Clicks" Daily Activity     Outcome Measure   Help from another person eating meals?: None Help from another person taking care of personal grooming?: None Help from another person toileting, which includes using toliet, bedpan, or urinal?: A Little Help from another person bathing (including washing, rinsing, drying)?: A Little Help from another person to put on and taking off regular upper body clothing?: None Help from another person to put on and taking off regular lower body clothing?: A Little 6 Click Score: 21    End of Session    OT Visit Diagnosis: Muscle weakness (generalized) (M62.81);Unsteadiness on feet (R26.81);Pain Pain - Right/Left: Left   Activity Tolerance Patient limited by fatigue;Other (comment)   Patient Left with call bell/phone within reach;with bed alarm set   Nurse Communication Other (comment)        Time: 1021-1173 OT Time Calculation (min): 23 min  Charges: OT General Charges $OT Visit: 1 Visit OT Treatments $Self Care/Home Management : 23-37 mins  Harrel Carina, MS, OTR/L   Harrel Carina 10/14/2018,  1:20 PM

## 2018-10-14 NOTE — Care Management Important Message (Signed)
Copy of signed IM left with patient in room.  

## 2018-10-14 NOTE — Progress Notes (Signed)
[facility]  Daily Progress Note   Subjective: 2 Days Post-Op: Redo leftcommon femoral, superficial femoral and profunda femoris endarterectomy with Cormatrix patch angioplasty with left superficial femoral and above-knee popliteal artery angioplasty and stent placement  Patient doing well. OOB walking / sitting today. No issues overnight.   Objective: Vitals:   10/13/18 1145 10/13/18 2155 10/14/18 0347 10/14/18 0359  BP: (!) 133/49 (!) 147/47 (!) 135/50   Pulse: 70 75 63   Resp: 18 16 18    Temp: 97.9 F (36.6 C) 97.9 F (36.6 C) 98 F (36.7 C)   TempSrc: Oral Oral Oral   SpO2: 100% 98% 99%   Weight:    55.8 kg  Height:        Intake/Output Summary (Last 24 hours) at 10/14/2018 1541 Last data filed at 10/14/2018 1517 Gross per 24 hour  Intake 240 ml  Output 3700 ml  Net -3460 ml   Physical Exam: A&Ox3, NAD CV: RRR Pulmonary: CTA Bilaterally Abdomen: Soft, Nontender, Nondistended Left Groin: VAC in place. No swelling. No ecchymosis Vascular:  Left Lower Extremity: Thigh soft. Calf soft. Extremity warm down to foot. Palpable DP pulse  Laboratory: CBC    Component Value Date/Time   WBC 12.3 (H) 10/14/2018 0340   HGB 7.7 (L) 10/14/2018 0340   HGB 8.9 (L) 08/23/2018 1534   HCT 24.4 (L) 10/14/2018 0340   HCT 27.4 (L) 08/23/2018 1534   PLT 246 10/14/2018 0340   PLT 267 06/02/2017 1014   BMET    Component Value Date/Time   NA 137 10/14/2018 0340   NA 136 01/16/2016 1135   NA 138 05/29/2014 1202   K 4.1 10/14/2018 0340   K 5.1 05/29/2014 1202   CL 109 10/14/2018 0340   CL 106 05/29/2014 1202   CO2 21 (L) 10/14/2018 0340   CO2 25 05/29/2014 1202   GLUCOSE 153 (H) 10/14/2018 0340   GLUCOSE 269 (H) 05/29/2014 1202   BUN 31 (H) 10/14/2018 0340   BUN 19 01/16/2016 1135   BUN 19 (H) 05/29/2014 1202   CREATININE 1.03 (H) 10/14/2018 0340   CREATININE 1.10 05/29/2014 1202   CALCIUM 8.7 (L) 10/14/2018 0340   CALCIUM 9.1 05/29/2014 1202   GFRNONAA 51 (L) 10/14/2018 0340   GFRNONAA 48 (L) 05/29/2014 1202   GFRAA 59 (L) 10/14/2018 0340   GFRAA 56 (L) 05/29/2014 1202   Assessment/Planning: The patient is an 83 year old female with severe PAD s/p redo leftcommon femoral, superficial femoral and profunda femoris endarterectomy with Cormatrix patch angioplasty with left superficial femoral and above-knee popliteal artery angioplasty and stent placement - POD #1 1) H&H stable 2) KVO fluids. Patient tolerating a regular diet 3) Glucophage restarted today  4) ASA / Plavix for PAD 5) Lovenox for DVT 6) Working with PT who recommended home with PT. 7) Most likely discharge home tomorrow  Discussed with Dr. Eber Hong Deyonte Cadden PA-C 10/14/2018 3:41 PM

## 2018-10-14 NOTE — Progress Notes (Signed)
Anticoagulation monitoring(Lovenox):  83yo  female ordered Lovenox 30 mg Q24h  Filed Weights   10/12/18 0624 10/14/18 0359  Weight: 121 lb 14.6 oz (55.3 kg) 123 lb 1.6 oz (55.8 kg)   Body mass index is 23.26 kg/m.    Lab Results  Component Value Date   CREATININE 1.03 (H) 10/14/2018   CREATININE 1.22 (H) 10/13/2018   CREATININE 1.21 (H) 10/12/2018   Estimated Creatinine Clearance: 32.3 mL/min (A) (by C-G formula based on SCr of 1.03 mg/dL (H)). Hemoglobin & Hematocrit     Component Value Date/Time   HGB 7.7 (L) 10/14/2018 0340   HGB 8.9 (L) 08/23/2018 1534   HCT 24.4 (L) 10/14/2018 0340   HCT 27.4 (L) 08/23/2018 1534     Per Protocol for Patient with estCrcl > 30 ml/min and BMI < 40, will transition to Lovenox 40 mg Q24h.

## 2018-10-14 NOTE — Progress Notes (Signed)
[facility] [place] NAME: Jill Hudson    MR#:  [id number]  DATE OF BIRTH:  [birth year]  SUBJECTIVE:  Patient seen today Tolerating physical therapy okay Has good appetite Weaned off oxygen Blood pressure is improved this morning She is off Norvasc and currently on diltiazem No complaints of any chest pain No shortness of breath Pain in the lower extremity left side decreased  REVIEW OF SYSTEMS:    ROS  CONSTITUTIONAL: No documented fever. No fatigue, weakness. No weight gain, no weight loss.  EYES: No blurry or double vision.  ENT: No tinnitus. No postnasal drip. No redness of the oropharynx.  RESPIRATORY: No cough, no wheeze, no hemoptysis. No dyspnea.  CARDIOVASCULAR: No chest pain. No orthopnea. No palpitations. No syncope.  GASTROINTESTINAL: No nausea, no vomiting or diarrhea. No abdominal pain. No melena or hematochezia.  GENITOURINARY: No dysuria or hematuria.  ENDOCRINE: No polyuria or nocturia. No heat or cold intolerance.  HEMATOLOGY: No anemia. No bruising. No bleeding.  INTEGUMENTARY: No rashes. No lesions.  MUSCULOSKELETAL: No arthritis. No swelling. No gout.  Decreased pain in the left lower extremity NEUROLOGIC: No numbness, tingling, or ataxia. No seizure-type activity.  PSYCHIATRIC: No anxiety. No insomnia. No ADD.   DRUG ALLERGIES:   Allergies  Allergen Reactions  . Saxagliptin Diarrhea  . Epinephrine Other (See Comments)    Patient does not remember what happens when she uses this  . Atorvastatin Other (See Comments)    Muscle aches  . Codeine Other (See Comments)    Upset stomach  . Ezetimibe Other (See Comments)    Myalgias(ZETIA)  . Limonene Rash    Patient does not recall this reaction  . Nitrofurantoin Rash and Other (See Comments)    Pruitus  . Sulfa Antibiotics Rash and Other (See Comments)    Sore mouth     VITALS:  Blood pressure (!) 135/50, pulse 63, temperature 98 F (36.7 C), temperature  source Oral, resp. rate 18, height 5\' 1"  (1.549 m), weight 55.8 kg, SpO2 99 %.  PHYSICAL EXAMINATION:   Physical Exam  GENERAL:  83 y.o.-year-old patient lying in the bed with no acute distress.  EYES: Pupils equal, round, reactive to light and accommodation. No scleral icterus. Extraocular muscles intact.  HEENT: Head atraumatic, normocephalic. Oropharynx and nasopharynx clear.  NECK:  Supple, no jugular venous distention. No thyroid enlargement, no tenderness.  LUNGS: Normal breath sounds bilaterally, no wheezing, rales, rhonchi. No use of accessory muscles of respiration.  CARDIOVASCULAR: S1, S2 normal. No murmurs, rubs, or gallops.  ABDOMEN: Soft, nontender, nondistended. Bowel sounds present. No organomegaly or mass.  EXTREMITIES: No cyanosis, clubbing or edema b/l.    NEUROLOGIC: Cranial nerves II through XII are intact. No focal Motor or sensory deficits b/l.   PSYCHIATRIC: The patient is alert and oriented x 3.  SKIN: No obvious rash, lesion, or ulcer.   LABORATORY PANEL:   CBC Recent Labs  Lab 10/14/18 0340  WBC 12.3*  HGB 7.7*  HCT 24.4*  PLT 246   ------------------------------------------------------------------------------------------------------------------ Chemistries  Recent Labs  Lab 10/14/18 0340  NA 137  K 4.1  CL 109  CO2 21*  GLUCOSE 153*  BUN 31*  CREATININE 1.03*  CALCIUM 8.7*  MG 2.0   ------------------------------------------------------------------------------------------------------------------  Cardiac Enzymes No results for input(s): TROPONINI in the last 168 hours. ------------------------------------------------------------------------------------------------------------------  RADIOLOGY:  No results found.   ASSESSMENT AND PLAN:  83 year old elderly female patient with history of peripheral vascular disease, type  2 diabetes mellitus, hypertension, chronic kidney disease under vascular surgery service  -Hypotension  resolved Discontinue chlorthalidone and Norvasc Continue diltiazem Patient was on 2 calcium channel blockers and hence Norvasc was stopped  -Type 2 diabetes mellitus Metformin will be held Sliding-scale coverage with insulin along with Lantus insulin and diabetic diet Metformin to be resumed as outpatient  -Hypertension Continue Cozaar  -Hyperlipidemia Continue Crestor  -Peripheral vascular disease Continue Plavix  -Hyponatremia resolved Discontinued chlorthalidone  - s/p left common femoral, profunda femoral and superficial femoral artery endarterectomies and patch angioplasty .  Vascular surgery follow-up appreciated  -We will sign off and follow patient as needed Thank you for consultation   All the records are reviewed and case discussed with Care Management/Social Worker. Management plans discussed with the patient, family and they are in agreement.  CODE STATUS: Full code  DVT Prophylaxis: SCDs  TOTAL TIME TAKING CARE OF THIS PATIENT: 34 minutes.   POSSIBLE D/C IN 2 to 3 DAYS, DEPENDING ON CLINICAL CONDITION.  Saundra Shelling M.D on 10/14/2018 at 1:30 PM  Between 7am to 6pm - Pager - [phone]  After 6pm go to [email or web address] - password [email or web address] [facility]  [facility] [place]  Office  [phone]  CC: Primary care physician; [name], MD  Note: This dictation was prepared with Dragon dictation along with smaller phrase technology. Any transcriptional errors that result from this process are unintentional.

## 2018-10-15 LAB — BASIC METABOLIC PANEL
Anion gap: 4 — ABNORMAL LOW (ref 5–15)
BUN: 22 mg/dL (ref 8–23)
CHLORIDE: 110 mmol/L (ref 98–111)
CO2: 22 mmol/L (ref 22–32)
Calcium: 8.1 mg/dL — ABNORMAL LOW (ref 8.9–10.3)
Creatinine, Ser: 0.98 mg/dL (ref 0.44–1.00)
GFR calc Af Amer: >60 mL/min (ref >60)
GFR calc non Af Amer: 54 mL/min — ABNORMAL LOW (ref >60)
Glucose, Bld: 146 mg/dL — ABNORMAL HIGH (ref 70–99)
POTASSIUM: 3.8 mmol/L (ref 3.5–5.1)
Sodium: 136 mmol/L (ref 135–145)

## 2018-10-15 LAB — MAGNESIUM: Magnesium: 1.7 mg/dL (ref 1.7–2.4)

## 2018-10-15 LAB — GLUCOSE, CAPILLARY
Glucose-Capillary: 144 mg/dL — ABNORMAL HIGH (ref 70–99)
Glucose-Capillary: 137 mg/dL — ABNORMAL HIGH (ref 70–99)

## 2018-10-15 LAB — CBC
HEMATOCRIT: 23.3 % — AB (ref 36.0–46.0)
HEMOGLOBIN: 7.4 g/dL — AB (ref 12.0–15.0)
MCH: 28.7 pg (ref 26.0–34.0)
MCHC: 31.8 g/dL (ref 30.0–36.0)
MCV: 90.3 fL (ref 80.0–100.0)
Platelets: 214 10*3/uL (ref 150–400)
RBC: 2.58 MIL/uL — ABNORMAL LOW (ref 3.87–5.11)
RDW: 13.3 % (ref 11.5–15.5)
WBC: 9.3 10*3/uL (ref 4.0–10.5)
nRBC: 0 % (ref 0.0–0.2)

## 2018-10-15 MED ORDER — OXYCODONE-ACETAMINOPHEN 5-325 MG PO TABS: 1.0000 | ORAL_TABLET | Freq: Four times a day (QID) | ORAL | 0 refills | Status: DC | PRN

## 2018-10-15 NOTE — Plan of Care (Signed)
10/15/2018 12:59 PM  Removed NWT from left groin. Sutures intact with scant bloody drainage. Cleansed area and placed a dry gauze for residual drainage. Educated patient on removing dressing once drainage has subsided.   Fuller Mandril, RN

## 2018-10-15 NOTE — Progress Notes (Signed)
10/15/2018 12:58 PM  [name] to be D/C'd Home per MD order.  Discussed prescriptions and follow up appointments with the patient. Prescriptions given to patient, medication list explained in detail. Pt verbalized understanding.  Allergies as of 10/15/2018      Reactions   Saxagliptin Diarrhea   Epinephrine Other (See Comments)   Patient does not remember what happens when she uses this   Atorvastatin Other (See Comments)   Muscle aches   Codeine Other (See Comments)   Upset stomach   Ezetimibe Other (See Comments)   Myalgias(ZETIA)   Limonene Rash   Patient does not recall this reaction   Nitrofurantoin Rash, Other (See Comments)   Pruitus   Sulfa Antibiotics Rash, Other (See Comments)   Sore mouth       Medication List    TAKE these medications   acetaminophen 650 MG CR tablet Commonly known as:  TYLENOL Take 1,300 mg by mouth every 8 (eight) hours as needed for pain.   amLODipine 2.5 MG tablet Commonly known as:  NORVASC Take 2.5 mg by mouth daily.   aspirin 81 MG tablet Take 81 mg by mouth daily.   baclofen 10 MG tablet Commonly known as:  LIORESAL TAKE 1 TABLET(10 MG) BY MOUTH THREE TIMES DAILY What changed:  See the new instructions.   BASAGLAR KWIKPEN 100 UNIT/ML Sopn Inject 12 Units into the skin daily.   Biotin 10 MG Tabs Take 10 mg by mouth daily.   CALCIUM-MAGNESIUM-ZINC PO Take 1 tablet by mouth daily.   CARTIA XT 180 MG 24 hr capsule Generic drug:  diltiazem Take 180 mg by mouth 2 (two) times daily.   chlorthalidone 25 MG tablet Commonly known as:  HYGROTON Take 25 mg by mouth daily.   clopidogrel 75 MG tablet Commonly known as:  PLAVIX Take 1 tablet (75 mg total) by mouth daily.   ferrous sulfate 325 (65 FE) MG tablet Take 325 mg by mouth daily with breakfast.   loratadine 10 MG tablet Commonly known as:  CLARITIN Take 10 mg by mouth daily as needed for allergies.   losartan 50 MG tablet Commonly known as:  COZAAR Take 50 mg  by mouth 2 (two) times daily.   metFORMIN 500 MG 24 hr tablet Commonly known as:  GLUCOPHAGE-XR TAKE ONE TABLET BY MOUTH TWICE DAILY   nitroGLYCERIN 0.4 MG SL tablet Commonly known as:  NITROSTAT 1 tab under the tongue for CP, may repeat in 5 minutes up to 3 doses   NOVOLOG FLEXPEN 100 UNIT/ML FlexPen Generic drug:  insulin aspart Inject 4 Units into the skin daily as needed. At lunch   oxyCODONE-acetaminophen 5-325 MG tablet Commonly known as:  PERCOCET Take 1-2 tablets by mouth every 6 (six) hours as needed for up to 5 days for moderate pain or severe pain.   PRESERVISION AREDS PO Take 1 capsule by mouth 2 (two) times daily.   MULTIVITAMIN PO Take 1 tablet by mouth daily.   RA VITAMIN B-12 TR 1000 MCG Tbcr Generic drug:  Cyanocobalamin Take 1,000 mcg by mouth daily.   rosuvastatin 5 MG tablet Commonly known as:  CRESTOR Take 5 mg by mouth every other day.   Vitamin-B Complex Tabs Take 1 tablet by mouth daily.       Vitals:   10/15/18 0331 10/15/18 0900  BP: (!) 162/44 (!) 151/55  Pulse: 74 60  Resp: 18   Temp: (!) 97.3 F (36.3 C)   SpO2: 100% 100%  Skin clean, dry and intact without evidence of skin break down, no evidence of skin tears noted. IV catheter discontinued intact. Site without signs and symptoms of complications. Dressing and pressure applied. Pt denies pain at this time. No complaints noted.  An After Visit Summary was printed and given to the patient. Patient escorted via [place], and D/C home via private auto.  Fuller Mandril, RN

## 2018-10-15 NOTE — Care Management Note (Signed)
Case Management Note  Patient Details  Name: Jill Hudson MRN: [id number] Date of Birth: 1935-03-28  Subjective/Objective:  Patient to be discharged per MD order. Orders in place for home health services. CMS Medicare.gov Compare Post Acute Care list reviewed with patient and patient would like to use [facility] as she is familiar with them. Referral placed previously but confirmed with Brenton Grills at [facility] for PT and RN services. Patient has all needed DME. Family to transport.  Ines Bloomer RN BSN RNCM [phone]                     Action/Plan:   Expected Discharge Date:  10/15/18               Expected Discharge Plan:  [place]  In-House Referral:     Discharge planning Services  CM Consult  Post Acute Care Choice:  Home Health Choice offered to:  Patient  DME Arranged:    DME Agency:     HH Arranged:  RN, PT [place] Agency:  [place]  Status of Service:  Completed, signed off  If discussed at [place] of Stay Meetings, dates discussed:    Additional Comments:  Kathyrn Drown Jayanth Szczesniak, RN 10/15/2018, 10:07 AM

## 2018-10-15 NOTE — Discharge Summary (Signed)
Physician Discharge Summary  Patient ID: Jill Hudson MRN: [id number] DOB/AGE: 1935/04/11 83 y.o.  Admit date: 10/12/2018 Discharge date: 10/15/2018  Admission Diagnoses:Active Problems:   Atherosclerosis of artery of extremity with rest pain [facility])  Discharge Diagnoses:  Active Problems:   Atherosclerosis of artery of extremity with rest pain [facility])   Discharged Condition: good  Hospital Course: Admitted for elective LEFT Femoral artery endarterectomy and SFA angioplasty for rest pain. Perioperative course unremarkable. Pain was well controlled. Ambulating without difficulty.  Consults: None  Significant Diagnostic Studies: none  1. Treatments: surgery: Redo left common femoral, superficial femoral and profunda femoris endarterectomy with Cormatrix patch angioplasty Left superficial femoral and above-knee popliteal artery angioplasty and stent placement  Discharge Exam: Blood pressure (!) 162/44, pulse 74, temperature (!) 97.3 F (36.3 C), temperature source Oral, resp. rate 18, height 5\' 1"  (1.549 m), weight 55.8 kg, SpO2 100 %. General appearance: alert and no distress Resp: clear to auscultation bilaterally Cardio: regular rate and rhythm, S1, S2 normal, no murmur, click, rub or gallop Extremities: Warm, well perfused, Palpable DP/PT, incision, C/D/I, soft Incision/Wound:  Disposition:    Home Follow-up Information    Schnier, Dolores Lory, MD Follow up in 2 week(s).   Specialties:  Vascular Surgery, Cardiology, Radiology, Vascular Surgery Contact information: [address] [place] [address] [phone]           Signed: Evaristo Bury 10/15/2018, 9:28 AM

## 2018-10-17 ENCOUNTER — Other Ambulatory Visit (INDEPENDENT_AMBULATORY_CARE_PROVIDER_SITE_OTHER): Payer: Self-pay

## 2018-10-17 ENCOUNTER — Encounter (INDEPENDENT_AMBULATORY_CARE_PROVIDER_SITE_OTHER): Payer: Self-pay | Admitting: Vascular Surgery

## 2018-10-17 ENCOUNTER — Ambulatory Visit (INDEPENDENT_AMBULATORY_CARE_PROVIDER_SITE_OTHER): Payer: Medicare Other | Admitting: Vascular Surgery

## 2018-10-17 ENCOUNTER — Other Ambulatory Visit: Payer: Self-pay

## 2018-10-17 ENCOUNTER — Telehealth (INDEPENDENT_AMBULATORY_CARE_PROVIDER_SITE_OTHER): Payer: Self-pay

## 2018-10-17 VITALS — BP 152/53 | HR 80 | Resp 18 | Ht 61.0 in | Wt 123.0 lb

## 2018-10-17 DIAGNOSIS — M79605 Pain in left leg: Secondary | ICD-10-CM

## 2018-10-17 DIAGNOSIS — M79604 Pain in right leg: Secondary | ICD-10-CM

## 2018-10-17 MED ORDER — OXYCODONE-ACETAMINOPHEN 5-325 MG PO TABS: 1.0000 | ORAL_TABLET | Freq: Four times a day (QID) | ORAL | 0 refills | Status: AC | PRN

## 2018-10-17 NOTE — Telephone Encounter (Signed)
Patient's daughter Judson Roch Gains called stating the patient recently had a femoral endarterectomy but states she is experiencing more pain now than before.

## 2018-10-17 NOTE — Telephone Encounter (Signed)
Patient has been given an appt in the office for today at 3:15 pm.

## 2018-10-17 NOTE — Telephone Encounter (Signed)
This may be reperfusion syndrome but to be on the safe side, please have her come in to be seen.

## 2018-10-18 ENCOUNTER — Encounter: Payer: Self-pay | Admitting: Vascular Surgery

## 2018-10-20 ENCOUNTER — Encounter (INDEPENDENT_AMBULATORY_CARE_PROVIDER_SITE_OTHER): Payer: Self-pay | Admitting: Vascular Surgery

## 2018-10-20 DIAGNOSIS — Z794 Long term (current) use of insulin: Secondary | ICD-10-CM | POA: Diagnosis not present

## 2018-10-20 DIAGNOSIS — Z7984 Long term (current) use of oral hypoglycemic drugs: Secondary | ICD-10-CM | POA: Diagnosis not present

## 2018-10-20 DIAGNOSIS — Z95820 Peripheral vascular angioplasty status with implants and grafts: Secondary | ICD-10-CM | POA: Diagnosis not present

## 2018-10-20 DIAGNOSIS — Z87891 Personal history of nicotine dependence: Secondary | ICD-10-CM | POA: Diagnosis not present

## 2018-10-20 DIAGNOSIS — I70222 Atherosclerosis of native arteries of extremities with rest pain, left leg: Secondary | ICD-10-CM | POA: Diagnosis not present

## 2018-10-20 DIAGNOSIS — E1151 Type 2 diabetes mellitus with diabetic peripheral angiopathy without gangrene: Secondary | ICD-10-CM | POA: Diagnosis not present

## 2018-10-20 DIAGNOSIS — Z48812 Encounter for surgical aftercare following surgery on the circulatory system: Secondary | ICD-10-CM | POA: Diagnosis not present

## 2018-10-20 DIAGNOSIS — Z8673 Personal history of transient ischemic attack (TIA), and cerebral infarction without residual deficits: Secondary | ICD-10-CM | POA: Diagnosis not present

## 2018-10-20 DIAGNOSIS — E785 Hyperlipidemia, unspecified: Secondary | ICD-10-CM | POA: Diagnosis not present

## 2018-10-20 DIAGNOSIS — Z7902 Long term (current) use of antithrombotics/antiplatelets: Secondary | ICD-10-CM | POA: Diagnosis not present

## 2018-10-20 DIAGNOSIS — N189 Chronic kidney disease, unspecified: Secondary | ICD-10-CM | POA: Diagnosis not present

## 2018-10-20 DIAGNOSIS — E1122 Type 2 diabetes mellitus with diabetic chronic kidney disease: Secondary | ICD-10-CM | POA: Diagnosis not present

## 2018-10-20 DIAGNOSIS — Z7982 Long term (current) use of aspirin: Secondary | ICD-10-CM | POA: Diagnosis not present

## 2018-10-20 DIAGNOSIS — I129 Hypertensive chronic kidney disease with stage 1 through stage 4 chronic kidney disease, or unspecified chronic kidney disease: Secondary | ICD-10-CM | POA: Diagnosis not present

## 2018-10-20 NOTE — Progress Notes (Signed)
Patient ID: Jill Hudson, female   DOB: 06-13-1935, 83 y.o.   MRN: [id number]  Chief Complaint  Patient presents with  . Follow-up    HPI Jill Hudson is a 83 y.o. female.   Patient called the office earlier today with concerns regarding leg pain.  She states that it has become worse than her postoperative pain.  We asked that she come in for evaluation which she has.   Past Medical History:  Diagnosis Date  . Allergies   . Anxiety   . Arthritis    spine and shoulder  . Cancer ([place])    skin  . Diabetes mellitus without complication ([place])   . Heart murmur   . Hyperlipidemia   . Hypertension   . Macula lutea degeneration   . Mitral and aortic valve disease   . Myocardial infarction [facility])    may have had a "light" heart attack  . Occasional tremors   . PAD (peripheral artery disease) ([place])   . Shingles    patient unaware but daughter confirms. it was a long time ago  . Stroke [facility]) 01/2017   may have had a slight stroke  . TIA (transient ischemic attack) 01/2017  . UTI (urinary tract infection)   . Vascular disease, peripheral [facility])     Past Surgical History:  Procedure Laterality Date  . CARDIAC CATHETERIZATION  1998   40% LM, 95% Ramus interm  . CATARACT EXTRACTION, BILATERAL    . COLONOSCOPY WITH PROPOFOL N/A 08/12/2018   Procedure: COLONOSCOPY WITH PROPOFOL;  Surgeon: Lucilla Lame, MD;  Location[facility] END[facility]COPY;  Service: Endoscopy;  Laterality: N/A;  . ENDARTERECTOMY FEMORAL Left 10/12/2018   Procedure: ENDARTERECTOMY FEMORAL;  Surgeon: Katha Cabal, MD;  Location: [facility] ORS;  Service: Vascular;  Laterality: Left;  angioplasty and left SFA stent placement  . EYE SURGERY Bilateral    cataract extractions  . LOWER EXTREMITY ANGIOGRAPHY Left 08/23/2017   Procedure: Lower Extremity Angiography;  Surgeon: Algernon Huxley, MD;  Location: [place] CV LAB;  Service: Cardiovascular;  Laterality: Left;  . LOWER EXTREMITY ANGIOGRAPHY Left 07/26/2018   Procedure:  LOWER EXTREMITY ANGIOGRAPHY;  Surgeon: Katha Cabal, MD;  Location: [place] CV LAB;  Service: Cardiovascular;  Laterality: Left;  . LOWER EXTREMITY ANGIOGRAPHY Left 09/16/2018   Procedure: LOWER EXTREMITY ANGIOGRAPHY;  Surgeon: Katha Cabal, MD;  Location: [place] CV LAB;  Service: Cardiovascular;  Laterality: Left;  . PTCA  08/2013   Left common iliac  . PTCA  12/2012   left ext iliac  . TUBAL LIGATION        Allergies  Allergen Reactions  . Saxagliptin Diarrhea  . Epinephrine Other (See Comments)    Patient does not remember what happens when she uses this  . Atorvastatin Other (See Comments)    Muscle aches  . Codeine Other (See Comments)    Upset stomach  . Ezetimibe Other (See Comments)    Myalgias(ZETIA)  . Limonene Rash    Patient does not recall this reaction  . Nitrofurantoin Rash and Other (See Comments)    Pruitus  . Sulfa Antibiotics Rash and Other (See Comments)    Sore mouth     Current Outpatient Medications  Medication Sig Dispense Refill  . acetaminophen (TYLENOL) 650 MG CR tablet Take 1,300 mg by mouth every 8 (eight) hours as needed for pain.    Marland Kitchen amLODipine (NORVASC) 2.5 MG tablet Take 2.5 mg by mouth daily.    Marland Kitchen  aspirin 81 MG tablet Take 81 mg by mouth daily.     . B Complex Vitamins (VITAMIN-B COMPLEX) TABS Take 1 tablet by mouth daily.     . B-D ULTRAFINE III SHORT PEN 31G X 8 MM MISC     . baclofen (LIORESAL) 10 MG tablet TAKE 1 TABLET(10 MG) BY MOUTH THREE TIMES DAILY (Patient taking differently: Take 10 mg by mouth at bedtime. ) 270 tablet 0  . Biotin 10 MG TABS Take 10 mg by mouth daily.     Marland Kitchen CALCIUM-MAGNESIUM-ZINC PO Take 1 tablet by mouth daily.     . chlorthalidone (HYGROTON) 25 MG tablet Take 25 mg by mouth daily.    . clopidogrel (PLAVIX) 75 MG tablet Take 1 tablet (75 mg total) by mouth daily. 30 tablet 4  . Cyanocobalamin (RA VITAMIN B-12 TR) 1000 MCG TBCR Take 1,000 mcg by mouth daily.     Marland Kitchen diltiazem (CARTIA XT)  180 MG 24 hr capsule Take 180 mg by mouth 2 (two) times daily.     . ferrous sulfate 325 (65 FE) MG tablet Take 325 mg by mouth daily with breakfast.    . insulin aspart (NOVOLOG FLEXPEN) 100 UNIT/ML FlexPen Inject 4 Units into the skin daily as needed. At lunch    . Insulin Glargine (BASAGLAR KWIKPEN) 100 UNIT/ML SOPN Inject 12 Units into the skin daily.    Marland Kitchen loratadine (CLARITIN) 10 MG tablet Take 10 mg by mouth daily as needed for allergies.     Marland Kitchen losartan (COZAAR) 50 MG tablet Take 50 mg by mouth 2 (two) times daily.     . metFORMIN (GLUCOPHAGE-XR) 500 MG 24 hr tablet TAKE ONE TABLET BY MOUTH TWICE DAILY (Patient taking differently: Take 500 mg by mouth 2 (two) times daily. ) 60 tablet 11  . Multiple Vitamins-Minerals (MULTIVITAMIN PO) Take 1 tablet by mouth daily.    . Multiple Vitamins-Minerals (PRESERVISION AREDS PO) Take 1 capsule by mouth 2 (two) times daily.     . nitroGLYCERIN (NITROSTAT) 0.4 MG SL tablet 1 tab under the tongue for CP, may repeat in 5 minutes up to 3 doses 25 tablet 5  . oxyCODONE-acetaminophen (PERCOCET) 5-325 MG tablet Take 1-2 tablets by mouth every 6 (six) hours as needed for up to 5 days for moderate pain or severe pain. 60 tablet 0  . rosuvastatin (CRESTOR) 5 MG tablet Take 5 mg by mouth every other day.      No current facility-administered medications for this visit.         Physical Exam BP (!) 152/53   Pulse 80   Resp 18   Ht 5\' 1"  (1.549 m)   Wt 123 lb (55.8 kg)   BMI 23.24 kg/m  Gen:  WD/WN, NAD Skin: incision C/D/I; left PT 2+  3+ edema     Assessment/Plan: 1. Pain in both lower extremities I believe the patient is experiencing reperfusion.  She has a palpable pulse.  She also has significant swelling.  I have asked that she begin elevating frequently and minimize standing.  We will see her back in a few weeks at her scheduled postoperative visit.       Jill Hudson 10/20/2018, 1:06 PM   This note was created with Dragon  medical transcription system.  Any errors from dictation are unintentional.

## 2018-10-24 ENCOUNTER — Telehealth: Payer: Self-pay

## 2018-10-24 DIAGNOSIS — Z48812 Encounter for surgical aftercare following surgery on the circulatory system: Secondary | ICD-10-CM | POA: Diagnosis not present

## 2018-10-24 DIAGNOSIS — E1151 Type 2 diabetes mellitus with diabetic peripheral angiopathy without gangrene: Secondary | ICD-10-CM | POA: Diagnosis not present

## 2018-10-24 DIAGNOSIS — N189 Chronic kidney disease, unspecified: Secondary | ICD-10-CM | POA: Diagnosis not present

## 2018-10-24 DIAGNOSIS — I70222 Atherosclerosis of native arteries of extremities with rest pain, left leg: Secondary | ICD-10-CM | POA: Diagnosis not present

## 2018-10-24 DIAGNOSIS — Z95820 Peripheral vascular angioplasty status with implants and grafts: Secondary | ICD-10-CM | POA: Diagnosis not present

## 2018-10-24 DIAGNOSIS — I129 Hypertensive chronic kidney disease with stage 1 through stage 4 chronic kidney disease, or unspecified chronic kidney disease: Secondary | ICD-10-CM | POA: Diagnosis not present

## 2018-10-24 NOTE — Telephone Encounter (Signed)
Merry Proud from [facility] called request verbal orders for PT for patient 2 x weekly for 3 weeks.  Called and gave verbal over the phone.  CB# [phone]

## 2018-10-25 DIAGNOSIS — Z48812 Encounter for surgical aftercare following surgery on the circulatory system: Secondary | ICD-10-CM | POA: Diagnosis not present

## 2018-10-25 DIAGNOSIS — I129 Hypertensive chronic kidney disease with stage 1 through stage 4 chronic kidney disease, or unspecified chronic kidney disease: Secondary | ICD-10-CM | POA: Diagnosis not present

## 2018-10-25 DIAGNOSIS — E1151 Type 2 diabetes mellitus with diabetic peripheral angiopathy without gangrene: Secondary | ICD-10-CM | POA: Diagnosis not present

## 2018-10-25 DIAGNOSIS — Z95820 Peripheral vascular angioplasty status with implants and grafts: Secondary | ICD-10-CM | POA: Diagnosis not present

## 2018-10-25 DIAGNOSIS — I70222 Atherosclerosis of native arteries of extremities with rest pain, left leg: Secondary | ICD-10-CM | POA: Diagnosis not present

## 2018-10-25 DIAGNOSIS — N189 Chronic kidney disease, unspecified: Secondary | ICD-10-CM | POA: Diagnosis not present

## 2018-10-27 DIAGNOSIS — Z48812 Encounter for surgical aftercare following surgery on the circulatory system: Secondary | ICD-10-CM | POA: Diagnosis not present

## 2018-10-27 DIAGNOSIS — Z95820 Peripheral vascular angioplasty status with implants and grafts: Secondary | ICD-10-CM | POA: Diagnosis not present

## 2018-10-27 DIAGNOSIS — I129 Hypertensive chronic kidney disease with stage 1 through stage 4 chronic kidney disease, or unspecified chronic kidney disease: Secondary | ICD-10-CM | POA: Diagnosis not present

## 2018-10-27 DIAGNOSIS — N189 Chronic kidney disease, unspecified: Secondary | ICD-10-CM | POA: Diagnosis not present

## 2018-10-27 DIAGNOSIS — E1151 Type 2 diabetes mellitus with diabetic peripheral angiopathy without gangrene: Secondary | ICD-10-CM | POA: Diagnosis not present

## 2018-10-27 DIAGNOSIS — I70222 Atherosclerosis of native arteries of extremities with rest pain, left leg: Secondary | ICD-10-CM | POA: Diagnosis not present

## 2018-10-31 ENCOUNTER — Other Ambulatory Visit (INDEPENDENT_AMBULATORY_CARE_PROVIDER_SITE_OTHER): Payer: Medicare Other | Admitting: Internal Medicine

## 2018-10-31 DIAGNOSIS — Z95820 Peripheral vascular angioplasty status with implants and grafts: Secondary | ICD-10-CM | POA: Diagnosis not present

## 2018-10-31 DIAGNOSIS — N183 Chronic kidney disease, stage 3 unspecified: Secondary | ICD-10-CM

## 2018-10-31 DIAGNOSIS — N189 Chronic kidney disease, unspecified: Secondary | ICD-10-CM | POA: Diagnosis not present

## 2018-10-31 DIAGNOSIS — Z48812 Encounter for surgical aftercare following surgery on the circulatory system: Secondary | ICD-10-CM | POA: Diagnosis not present

## 2018-10-31 DIAGNOSIS — I70222 Atherosclerosis of native arteries of extremities with rest pain, left leg: Secondary | ICD-10-CM | POA: Diagnosis not present

## 2018-10-31 DIAGNOSIS — E782 Mixed hyperlipidemia: Secondary | ICD-10-CM

## 2018-10-31 DIAGNOSIS — E1165 Type 2 diabetes mellitus with hyperglycemia: Secondary | ICD-10-CM | POA: Diagnosis not present

## 2018-10-31 DIAGNOSIS — Z7982 Long term (current) use of aspirin: Secondary | ICD-10-CM | POA: Insufficient documentation

## 2018-10-31 DIAGNOSIS — I63331 Cerebral infarction due to thrombosis of right posterior cerebral artery: Secondary | ICD-10-CM

## 2018-10-31 DIAGNOSIS — IMO0002 Reserved for concepts with insufficient information to code with codable children: Secondary | ICD-10-CM

## 2018-10-31 DIAGNOSIS — E1151 Type 2 diabetes mellitus with diabetic peripheral angiopathy without gangrene: Secondary | ICD-10-CM | POA: Diagnosis not present

## 2018-10-31 DIAGNOSIS — Z794 Long term (current) use of insulin: Secondary | ICD-10-CM | POA: Insufficient documentation

## 2018-10-31 DIAGNOSIS — E1142 Type 2 diabetes mellitus with diabetic polyneuropathy: Secondary | ICD-10-CM | POA: Diagnosis not present

## 2018-10-31 DIAGNOSIS — Z7902 Long term (current) use of antithrombotics/antiplatelets: Secondary | ICD-10-CM | POA: Insufficient documentation

## 2018-10-31 DIAGNOSIS — I129 Hypertensive chronic kidney disease with stage 1 through stage 4 chronic kidney disease, or unspecified chronic kidney disease: Secondary | ICD-10-CM | POA: Diagnosis not present

## 2018-10-31 DIAGNOSIS — Z7984 Long term (current) use of oral hypoglycemic drugs: Secondary | ICD-10-CM | POA: Insufficient documentation

## 2018-10-31 DIAGNOSIS — I70229 Atherosclerosis of native arteries of extremities with rest pain, unspecified extremity: Secondary | ICD-10-CM

## 2018-10-31 DIAGNOSIS — I10 Essential (primary) hypertension: Secondary | ICD-10-CM

## 2018-10-31 NOTE — Progress Notes (Signed)
Received orders from [place]. Start of care 11/20/17 through 12/18/18. Orders are reviewed, signed and faxed.

## 2018-11-01 ENCOUNTER — Telehealth (INDEPENDENT_AMBULATORY_CARE_PROVIDER_SITE_OTHER): Payer: Self-pay

## 2018-11-01 NOTE — Telephone Encounter (Signed)
Patient called and left a message on the triage line and stated that her incision from surgery is swollen and would like to know what she would do. Please advise

## 2018-11-01 NOTE — Telephone Encounter (Signed)
Spoke with pt who states that the swelling has suddenly come on. She also states she notices what looks like one of her stitches is hanging off. She states she had some redness due to the location which possibly could be due clothing choices. [facility] is coming tomorrow 10am. Per Arna Medici in office, Pt is to have them look at it and contact us tomorrow with their assessment. She had no additional questions at this time.

## 2018-11-01 NOTE — Telephone Encounter (Signed)
Some swelling following surgery is normal, and some wounds will get a little "healing ridge" on the incision line that is a little puffy. Has the swelling been there or did it just suddenly happen? If it has been there that is ok, she should continue to watch it until we see her on 11/21/18. If the swelling just suddenly happened, we can have her come in.  If the incision and swelling is red, painful, or oozing she should come in and be seen.

## 2018-11-02 ENCOUNTER — Other Ambulatory Visit: Payer: Self-pay

## 2018-11-02 ENCOUNTER — Encounter: Payer: Self-pay | Admitting: Emergency Medicine

## 2018-11-02 ENCOUNTER — Emergency Department
Admission: EM | Admit: 2018-11-02 | Discharge: 2018-11-02 | Disposition: A | Discharge status: Home / Self Care | Payer: Medicare Other | Attending: Emergency Medicine | Admitting: Emergency Medicine

## 2018-11-02 ENCOUNTER — Other Ambulatory Visit (INDEPENDENT_AMBULATORY_CARE_PROVIDER_SITE_OTHER): Payer: Self-pay | Admitting: Nurse Practitioner

## 2018-11-02 ENCOUNTER — Emergency Department: Payer: Medicare Other

## 2018-11-02 DIAGNOSIS — R600 Localized edema: Secondary | ICD-10-CM | POA: Diagnosis not present

## 2018-11-02 DIAGNOSIS — M79662 Pain in left lower leg: Secondary | ICD-10-CM | POA: Diagnosis not present

## 2018-11-02 DIAGNOSIS — I10 Essential (primary) hypertension: Secondary | ICD-10-CM | POA: Diagnosis not present

## 2018-11-02 DIAGNOSIS — Z8673 Personal history of transient ischemic attack (TIA), and cerebral infarction without residual deficits: Secondary | ICD-10-CM | POA: Insufficient documentation

## 2018-11-02 DIAGNOSIS — R609 Edema, unspecified: Secondary | ICD-10-CM | POA: Diagnosis not present

## 2018-11-02 DIAGNOSIS — Z79899 Other long term (current) drug therapy: Secondary | ICD-10-CM | POA: Diagnosis not present

## 2018-11-02 DIAGNOSIS — E1122 Type 2 diabetes mellitus with diabetic chronic kidney disease: Secondary | ICD-10-CM | POA: Insufficient documentation

## 2018-11-02 DIAGNOSIS — Z87891 Personal history of nicotine dependence: Secondary | ICD-10-CM | POA: Insufficient documentation

## 2018-11-02 DIAGNOSIS — I129 Hypertensive chronic kidney disease with stage 1 through stage 4 chronic kidney disease, or unspecified chronic kidney disease: Secondary | ICD-10-CM | POA: Insufficient documentation

## 2018-11-02 DIAGNOSIS — Z794 Long term (current) use of insulin: Secondary | ICD-10-CM | POA: Insufficient documentation

## 2018-11-02 DIAGNOSIS — I70222 Atherosclerosis of native arteries of extremities with rest pain, left leg: Secondary | ICD-10-CM | POA: Diagnosis not present

## 2018-11-02 DIAGNOSIS — N189 Chronic kidney disease, unspecified: Secondary | ICD-10-CM | POA: Diagnosis not present

## 2018-11-02 DIAGNOSIS — Z7982 Long term (current) use of aspirin: Secondary | ICD-10-CM | POA: Diagnosis not present

## 2018-11-02 DIAGNOSIS — Z85828 Personal history of other malignant neoplasm of skin: Secondary | ICD-10-CM | POA: Diagnosis not present

## 2018-11-02 DIAGNOSIS — Z7902 Long term (current) use of antithrombotics/antiplatelets: Secondary | ICD-10-CM | POA: Diagnosis not present

## 2018-11-02 DIAGNOSIS — E1151 Type 2 diabetes mellitus with diabetic peripheral angiopathy without gangrene: Secondary | ICD-10-CM | POA: Diagnosis not present

## 2018-11-02 DIAGNOSIS — I252 Old myocardial infarction: Secondary | ICD-10-CM | POA: Insufficient documentation

## 2018-11-02 DIAGNOSIS — Z95820 Peripheral vascular angioplasty status with implants and grafts: Secondary | ICD-10-CM | POA: Diagnosis not present

## 2018-11-02 DIAGNOSIS — N183 Chronic kidney disease, stage 3 (moderate): Secondary | ICD-10-CM | POA: Insufficient documentation

## 2018-11-02 DIAGNOSIS — M7989 Other specified soft tissue disorders: Secondary | ICD-10-CM | POA: Diagnosis not present

## 2018-11-02 DIAGNOSIS — Z48812 Encounter for surgical aftercare following surgery on the circulatory system: Secondary | ICD-10-CM | POA: Diagnosis not present

## 2018-11-02 LAB — BASIC METABOLIC PANEL
Anion gap: 11 (ref 5–15)
BUN: 20 mg/dL (ref 8–23)
CO2: 22 mmol/L (ref 22–32)
Calcium: 9.3 mg/dL (ref 8.9–10.3)
Chloride: 102 mmol/L (ref 98–111)
Creatinine, Ser: 0.98 mg/dL (ref 0.44–1.00)
GFR calc Af Amer: >60 mL/min (ref >60)
GFR calc non Af Amer: 54 mL/min — ABNORMAL LOW (ref >60)
Glucose, Bld: 219 mg/dL — ABNORMAL HIGH (ref 70–99)
POTASSIUM: 4.7 mmol/L (ref 3.5–5.1)
Sodium: 135 mmol/L (ref 135–145)

## 2018-11-02 LAB — CBC
HCT: 31.8 % — ABNORMAL LOW (ref 36.0–46.0)
HEMOGLOBIN: 9.7 g/dL — AB (ref 12.0–15.0)
MCH: 27.8 pg (ref 26.0–34.0)
MCHC: 30.5 g/dL (ref 30.0–36.0)
MCV: 91.1 fL (ref 80.0–100.0)
Platelets: 345 10*3/uL (ref 150–400)
RBC: 3.49 MIL/uL — AB (ref 3.87–5.11)
RDW: 14.4 % (ref 11.5–15.5)
WBC: 7.1 10*3/uL (ref 4.0–10.5)
nRBC: 0 % (ref 0.0–0.2)

## 2018-11-02 LAB — TROPONIN I: Troponin I: <0.03 ng/mL (ref <0.03)

## 2018-11-02 LAB — APTT: aPTT: 33 seconds (ref 24–36)

## 2018-11-02 LAB — PROTIME-INR
INR: 1.08
Prothrombin Time: 13.9 seconds (ref 11.4–15.2)

## 2018-11-02 LAB — BRAIN NATRIURETIC PEPTIDE: B Natriuretic Peptide: 450 pg/mL — ABNORMAL HIGH (ref 0.0–100.0)

## 2018-11-02 NOTE — Telephone Encounter (Signed)
Spoke with Amy at [facility], she called the office to let us know that she felt the patient needed to be seen today. She stated that the surgical site is fine and looks well. Left lower extremity is warm to the touch, red, and painful. She also said it was changing colors. I checked with Arna Medici, NP and Yvetta Coder the Engineer, building services and we had no availability to get the patient in today for an ultrasound, which is what she needs. Offered the patient a 7:30am, 11am, or 2pm appointment for her to have the ultrasound tomorrow. Patient declined and stated she wanted to be seen today. With no availability at the office she was advised to go to the emergency room. Amy stated that she was sending her over to the emergency room now. Patient agreed and aware of all information.

## 2018-11-02 NOTE — Discharge Instructions (Addendum)
Discussed with your vascular surgeon, they want you to be sure to keep your legs elevated and wear the compression stockings, take taking your Plavix and aspirin.  If you have increased pain, fever, numbness, weakness, redness, or other concerns please return to the emergency room.  Otherwise, please follow closely with the vascular surgeons as scheduled or earlier if they can get you in

## 2018-11-02 NOTE — ED Triage Notes (Addendum)
Pt sent by Home health nurse due to increased swelling and pain to LLE. Notable swelling and warmth noted to LLE. PT had recent cardiac cath with stents placed on 12/16 through left femoral artery. Pt in NAD , denies CP or SOB

## 2018-11-02 NOTE — ED Notes (Signed)
Spoke with MD. Burlene Arnt about pt presentation, see orders for both [place] and blood work

## 2018-11-02 NOTE — ED Provider Notes (Signed)
[facility] Emergency Department Provider Note  ____________________________________________   I have reviewed the triage vital signs and the nursing notes. Where available I have reviewed prior notes and, if possible and indicated, outside hospital notes.    HISTORY  Chief Complaint Leg Swelling    HPI Jill Hudson is a 84 y.o. female   Who had an endarterectomy done on December 16 by Dr. [name], she had stents placed in the left leg.  She states since that time, or at least a day or 2 after, she is been having left lower extremity swelling.  She did see them once but she is concerned that the swelling is persistent.  She denies any chest pain or shortness of breath no nausea no vomiting, she has baseline neuropathy from her diabetes and that is unchanged.  She does not fall and she does not feel the leg is weak.  She does have a follow-up appointment with Dr. [name] in a couple weeks but is concerned about the ongoing swelling of the entire left leg.  She does try to keep it elevated she does not use compression stockings no other alleviating or aggravating symptoms no other complaints.  No other prior treatment.  Denies pain or fever     Past Medical History:  Diagnosis Date  . Allergies   . Anxiety   . Arthritis    spine and shoulder  . Cancer ([place])    skin  . Diabetes mellitus without complication ([place])   . Heart murmur   . Hyperlipidemia   . Hypertension   . Macula lutea degeneration   . Mitral and aortic valve disease   . Myocardial infarction [facility])    may have had a "light" heart attack  . Occasional tremors   . PAD (peripheral artery disease) ([place])   . Shingles    patient unaware but daughter confirms. it was a long time ago  . Stroke [facility]) 01/2017   may have had a slight stroke  . TIA (transient ischemic attack) 01/2017  . UTI (urinary tract infection)   . Vascular disease, peripheral [facility])     Patient Active Problem List    Diagnosis Date Noted  . Encounter for long-term (current) use of aspirin 10/31/2018  . Encounter for long-term (current) use of antiplatelets/antithrombotics 10/31/2018  . Long term current use of oral hypoglycemic drug 10/31/2018  . Encounter for long-term (current) use of insulin ([place]) 10/31/2018  . Atherosclerosis of artery of extremity with rest pain ([place]) 10/12/2018  . GIB (gastrointestinal bleeding) 08/11/2018  . Leg pain 07/03/2017  . Carpal tunnel syndrome on both sides 05/05/2017  . Myalgia due to HMG CoA reductase inhibitor 05/05/2017  . CVA (cerebral vascular accident) ([place]) 02/15/2017  . Degenerative disc disease, lumbar 12/21/2016  . Atherosclerosis of native arteries of extremity with rest pain ([place]) 12/20/2016  . Bilateral carotid artery stenosis 12/20/2016  . Hip bursitis 05/18/2016  . Elevated TSH 01/18/2016  . Chronic renal insufficiency, stage III (moderate) ([place]) 01/16/2016  . Senile ecchymosis 01/16/2016  . Uncontrolled type 2 diabetes mellitus with diabetic polyneuropathy, with long-term current use of insulin ([place]) 09/16/2015  . GERD (gastroesophageal reflux disease) 07/24/2015  . PAD (peripheral artery disease) ([place]) 05/17/2015  . Neoplasm of uncertain behavior of skin 05/17/2015  . Retinopathy, diabetic, proliferative ([place]) 04/11/2015  . Mixed hyperlipidemia 04/11/2015  . Essential (primary) hypertension 04/11/2015  . Generalized OA 04/11/2015  . Arteriosclerosis of coronary artery 05/29/2013  . Hypertensive heart disease without CHF  05/29/2013    Past Surgical History:  Procedure Laterality Date  . CARDIAC CATHETERIZATION  1998   40% LM, 95% Ramus interm  . CATARACT EXTRACTION, BILATERAL    . COLONOSCOPY WITH PROPOFOL N/A 08/12/2018   Procedure: COLONOSCOPY WITH PROPOFOL;  Surgeon: Lucilla Lame, MD;  Location: [facility] ENDOSCOPY;  Service: Endoscopy;  Laterality: N/A;  . ENDARTERECTOMY FEMORAL Left 10/12/2018   Procedure: ENDARTERECTOMY FEMORAL;  Surgeon:  Katha Cabal, MD;  Location: [facility] ORS;  Service: Vascular;  Laterality: Left;  angioplasty and left SFA stent placement  . EYE SURGERY Bilateral    cataract extractions  . LOWER EXTREMITY ANGIOGRAPHY Left 08/23/2017   Procedure: Lower Extremity Angiography;  Surgeon: Algernon Huxley, MD;  Location: [place] CV LAB;  Service: Cardiovascular;  Laterality: Left;  . LOWER EXTREMITY ANGIOGRAPHY Left 07/26/2018   Procedure: LOWER EXTREMITY ANGIOGRAPHY;  Surgeon: Katha Cabal, MD;  Location: [place] CV LAB;  Service: Cardiovascular;  Laterality: Left;  . LOWER EXTREMITY ANGIOGRAPHY Left 09/16/2018   Procedure: LOWER EXTREMITY ANGIOGRAPHY;  Surgeon: Katha Cabal, MD;  Location: [place] CV LAB;  Service: Cardiovascular;  Laterality: Left;  . PTCA  08/2013   Left common iliac  . PTCA  12/2012   left ext iliac  . TUBAL LIGATION      Prior to Admission medications   Medication Sig Start Date End Date Taking? Authorizing Provider  acetaminophen (TYLENOL) 650 MG CR tablet Take 1,300 mg by mouth every 8 (eight) hours as needed for pain.    [provider]  amLODipine (NORVASC) 2.5 MG tablet Take 2.5 mg by mouth daily. 05/20/18   [provider]  aspirin 81 MG tablet Take 81 mg by mouth daily.     [provider]  B Complex Vitamins (VITAMIN-B COMPLEX) TABS Take 1 tablet by mouth daily.     [provider]  B-D ULTRAFINE III SHORT PEN 31G X 8 MM MISC  09/27/18   [provider]  baclofen (LIORESAL) 10 MG tablet TAKE 1 TABLET(10 MG) BY MOUTH THREE TIMES DAILY Patient taking differently: Take 10 mg by mouth at bedtime.  02/09/18   [name], MD  Biotin 10 MG TABS Take 10 mg by mouth daily.     [provider]  CALCIUM-MAGNESIUM-ZINC PO Take 1 tablet by mouth daily.     [provider]  chlorthalidone (HYGROTON) 25 MG tablet Take 25 mg by mouth daily.    [provider]  clopidogrel (PLAVIX) 75 MG  tablet Take 1 tablet (75 mg total) by mouth daily. 07/27/18   Schnier, Dolores Lory, MD  Cyanocobalamin (RA VITAMIN B-12 TR) 1000 MCG TBCR Take 1,000 mcg by mouth daily.     [provider]  diltiazem (CARTIA XT) 180 MG 24 hr capsule Take 180 mg by mouth 2 (two) times daily.     [provider]  ferrous sulfate 325 (65 FE) MG tablet Take 325 mg by mouth daily with breakfast.    [provider]  insulin aspart (NOVOLOG FLEXPEN) 100 UNIT/ML FlexPen Inject 4 Units into the skin daily as needed. At lunch 03/26/17   [provider]  Insulin Glargine (BASAGLAR KWIKPEN) 100 UNIT/ML SOPN Inject 12 Units into the skin daily.    [provider]  loratadine (CLARITIN) 10 MG tablet Take 10 mg by mouth daily as needed for allergies.     [provider]  losartan (COZAAR) 50 MG tablet Take 50 mg by mouth 2 (two)  times daily.  06/20/18   [provider]  metFORMIN (GLUCOPHAGE-XR) 500 MG 24 hr tablet TAKE ONE TABLET BY MOUTH TWICE DAILY Patient taking differently: Take 500 mg by mouth 2 (two) times daily.  01/26/18   [name], MD  Multiple Vitamins-Minerals (MULTIVITAMIN PO) Take 1 tablet by mouth daily.    [provider]  Multiple Vitamins-Minerals (PRESERVISION AREDS PO) Take 1 capsule by mouth 2 (two) times daily.     [provider]  nitroGLYCERIN (NITROSTAT) 0.4 MG SL tablet 1 tab under the tongue for CP, may repeat in 5 minutes up to 3 doses 09/07/18   [name], MD  rosuvastatin (CRESTOR) 5 MG tablet Take 5 mg by mouth every other day.  03/02/18   [provider]    Allergies Saxagliptin; Epinephrine; Atorvastatin; Codeine; Ezetimibe; Limonene; Nitrofurantoin; and Sulfa antibiotics  Family History  Problem Relation Age of Onset  . Dementia Mother   . Diabetes Father     Social History Social History   Tobacco Use  . Smoking status: Former Smoker    Packs/day: 2.00    Years: 37.00    Pack years:  74.00    Types: Cigarettes    Last attempt to quit: 1980    Years since quitting: 40.0  . Smokeless tobacco: Never Used  . Tobacco comment: smoking cessation materials not required  Substance Use Topics  . Alcohol use: No    Alcohol/week: 0.0 standard drinks  . Drug use: No    Review of Systems Constitutional: No fever/chills Eyes: No visual changes. ENT: No sore throat. No stiff neck no neck pain Cardiovascular: Denies chest pain. Respiratory: Denies shortness of breath. Gastrointestinal:   no vomiting.  No diarrhea.  No constipation. Genitourinary: Negative for dysuria. Musculoskeletal: + lower extremity swelling Skin: Negative for rash. Neurological: Negative for severe headaches, focal weakness or numbness.   ____________________________________________   PHYSICAL EXAM:  VITAL SIGNS: ED Triage Vitals [11/02/18 1546]  Enc Vitals Group     BP (!) 176/59     Pulse Rate 76     Resp 16     Temp 98.3 F (36.8 C)     Temp Source Oral     SpO2 100 %     Weight 122 lb (55.3 kg)     Height 5\' 1"  (1.549 m)     Head Circumference      Peak Flow      Pain Score 7     Pain Loc      Pain Edu?      Excl. in [place]?     Constitutional: Alert and oriented. Well appearing and in no acute distress. Eyes: Conjunctivae are normal Head: Atraumatic HEENT: No congestion/rhinnorhea. Mucous membranes are moist.  Oropharynx non-erythematous Neck:   Nontender with no meningismus, no masses, no stridor Cardiovascular: Normal rate, regular rhythm. Grossly normal heart sounds.  Good peripheral circulation. Respiratory: Normal respiratory effort.  No retractions. Lungs CTAB. Abdominal: Soft and nontender. No distention. No guarding no rebound Back:  There is no focal tenderness or step off.  there is no midline tenderness there are no lesions noted. there is no CVA tenderness Musculoskeletal: No lower extremity tenderness, no upper extremity tenderness. No joint effusions, there are very  strong bilateral DP and posterior tibial pulses, with good cap refill.  Right lower extremity is normal appearance left lower extremity has diffuse edema, there is no significant tenderness to the site of the incision in the left inguinal region,  there is some mild induration there but is not fluctuant red or hot to touch it is nontender and there is good pulses with no evidence Neurologic:  Normal speech and language. No gross focal neurologic deficits are appreciated.  Skin:  Skin is warm, dry and intact. No rash noted. Psychiatric: Mood and affect are normal. Speech and behavior are normal.  ____________________________________________   LABS (all labs ordered are listed, but only abnormal results are displayed)  Labs Reviewed  BRAIN NATRIURETIC PEPTIDE - Abnormal; Notable for the following components:      Result Value   B Natriuretic Peptide 450.0 (*)    All other components within normal limits  BASIC METABOLIC PANEL - Abnormal; Notable for the following components:   Glucose, Bld 219 (*)    GFR calc non Af Amer 54 (*)    All other components within normal limits  CBC - Abnormal; Notable for the following components:   RBC 3.49 (*)    Hemoglobin 9.7 (*)    HCT 31.8 (*)    All other components within normal limits  TROPONIN I  PROTIME-INR  APTT    Pertinent labs  results that were available during my care of the patient were reviewed by me and considered in my medical decision making (see chart for details). ____________________________________________  EKG  I personally interpreted any EKGs ordered by me or triage  ____________________________________________  RADIOLOGY  Pertinent labs & imaging results that were available during my care of the patient were reviewed by me and considered in my medical decision making (see chart for details). If possible, patient and/or family made aware of any abnormal findings.  Dg Chest 2 View  Result Date: 11/02/2018 CLINICAL DATA:   Increased swelling and pain in the left lower extremity. Recent cardiac catheterization. EXAM: CHEST - 2 VIEW COMPARISON:  Thoracic spine radiographs 01/24/2018. Portable chest 05/03/2013. FINDINGS: The heart size and mediastinal contours are stable. There is heavy aortic atherosclerosis. The lungs are clear. There is no pleural effusion or pneumothorax. No acute osseous findings. There are degenerative changes throughout the spine associated with a convex left upper lumbar scoliosis. IMPRESSION: No evidence of active cardiopulmonary process. Aortic Atherosclerosis (ICD10-I70.0). Electronically Signed   By: Richardean Sale M.D.   On: 11/02/2018 16:18   US Venous Img Lower Unilateral Left  Result Date: 11/02/2018 CLINICAL DATA:  Left groin pain and edema and status post left femoral artery access with percutaneous coronary intervention on 10/10/2018. EXAM: LEFT LOWER EXTREMITY VENOUS DOPPLER ULTRASOUND TECHNIQUE: Gray-scale sonography with graded compression, as well as color Doppler and duplex ultrasound were performed to evaluate the lower extremity deep venous systems from the level of the common femoral vein and including the common femoral, femoral, profunda femoral, popliteal and calf veins including the posterior tibial, peroneal and gastrocnemius veins when visible. The superficial great saphenous vein was also interrogated. Spectral Doppler was utilized to evaluate flow at rest and with distal augmentation maneuvers in the common femoral, femoral and popliteal veins. COMPARISON:  None. FINDINGS: Contralateral Common Femoral Vein: Respiratory phasicity is normal and symmetric with the symptomatic side. No evidence of thrombus. Normal compressibility. Common Femoral Vein: No evidence of thrombus. Normal compressibility, respiratory phasicity and response to augmentation. Saphenofemoral Junction: No evidence of thrombus. Normal compressibility and flow on color Doppler imaging. Profunda Femoral Vein: No  evidence of thrombus. Normal compressibility and flow on color Doppler imaging. Femoral Vein: No evidence of thrombus. Normal compressibility, respiratory phasicity and response to augmentation. Popliteal Vein: No  evidence of thrombus. Normal compressibility, respiratory phasicity and response to augmentation. Calf Veins: No evidence of thrombus. Normal compressibility and flow on color Doppler imaging. Superficial Great Saphenous Vein: No evidence of thrombus. Normal compressibility. Venous Reflux:  None. Other Findings:  No evidence of superficial thrombophlebitis. IMPRESSION: No evidence of left lower extremity deep venous thrombosis. Electronically Signed   By: Aletta Edouard M.D.   On: 11/02/2018 17:29   [place] Lower Ext Art Left Ltd  Result Date: 11/02/2018 CLINICAL DATA:  Left groin pain and edema. Status post left femoral artery EXAM: LEFT LOWER EXTREMITY ARTERIAL DUPLEX SCAN TECHNIQUE: Gray-scale sonography as well as color Doppler and duplex ultrasound was performed to evaluate the lower extremity arteries including the common, superficial and profunda femoral arteries, popliteal artery and calf arteries. COMPARISON:  None. FINDINGS: The left common femoral artery appears patent and demonstrates no evidence of pseudoaneurysm. There is no evidence of AV fistula. An elongated hypoechoic hematoma is seen anterior to the common femoral artery and region of the femoral bifurcation measuring approximately 7.1 x 2.3 x 3.3 cm. There is no blood flow demonstrated in the hematoma. IMPRESSION: No evidence of left femoral artery pseudoaneurysm. There is an elongated hematoma anterior to the left common femoral artery and femoral bifurcation measuring up to 7 cm in greatest estimated length. Electronically Signed   By: Aletta Edouard M.D.   On: 11/02/2018 17:40   ____________________________________________    PROCEDURES  Procedure(s) performed: None  Procedures  Critical Care performed:  None  ____________________________________________   INITIAL IMPRESSION / ASSESSMENT AND PLAN / ED COURSE  Pertinent labs & imaging results that were available during my care of the patient were reviewed by me and considered in my medical decision making (see chart for details).  At this time, patient has lower extremity swelling which is been present since her procedure, no evidence of DVT strong distal pulses no evidence of infection no evidence of aneurysmal event no evidence of AV fistula no evidence of vascular compromise or acute neurologic pathology.  Discussed with Dr. dew, we discussed all of her findings including her surgical history and all that we have done today including her physical exam and blood work and imaging, who feels the patient should wear compression stockings on the side and follow closely with him or Dr. [name].  Patient has been relayed all of this information she is in no acute distress she is ambulatory, there is no evidence of infection, she is in good shape to go home and will follow-up to vascular surgery.  Return precautions and follow-up given understood    ____________________________________________   FINAL CLINICAL IMPRESSION(S) / ED DIAGNOSES  Final diagnoses:  None      This chart was dictated using voice recognition software.  Despite best efforts to proofread,  errors can occur which can change meaning.      Schuyler Amor, MD 11/02/18 [phone]

## 2018-11-04 DIAGNOSIS — E1159 Type 2 diabetes mellitus with other circulatory complications: Secondary | ICD-10-CM | POA: Diagnosis not present

## 2018-11-04 DIAGNOSIS — E1169 Type 2 diabetes mellitus with other specified complication: Secondary | ICD-10-CM | POA: Diagnosis not present

## 2018-11-04 DIAGNOSIS — I10 Essential (primary) hypertension: Secondary | ICD-10-CM | POA: Diagnosis not present

## 2018-11-04 DIAGNOSIS — E785 Hyperlipidemia, unspecified: Secondary | ICD-10-CM | POA: Diagnosis not present

## 2018-11-04 DIAGNOSIS — E1142 Type 2 diabetes mellitus with diabetic polyneuropathy: Secondary | ICD-10-CM | POA: Diagnosis not present

## 2018-11-04 DIAGNOSIS — Z794 Long term (current) use of insulin: Secondary | ICD-10-CM | POA: Diagnosis not present

## 2018-11-07 DIAGNOSIS — Z95820 Peripheral vascular angioplasty status with implants and grafts: Secondary | ICD-10-CM | POA: Diagnosis not present

## 2018-11-07 DIAGNOSIS — I129 Hypertensive chronic kidney disease with stage 1 through stage 4 chronic kidney disease, or unspecified chronic kidney disease: Secondary | ICD-10-CM | POA: Diagnosis not present

## 2018-11-07 DIAGNOSIS — I70222 Atherosclerosis of native arteries of extremities with rest pain, left leg: Secondary | ICD-10-CM | POA: Diagnosis not present

## 2018-11-07 DIAGNOSIS — Z48812 Encounter for surgical aftercare following surgery on the circulatory system: Secondary | ICD-10-CM | POA: Diagnosis not present

## 2018-11-07 DIAGNOSIS — E1151 Type 2 diabetes mellitus with diabetic peripheral angiopathy without gangrene: Secondary | ICD-10-CM | POA: Diagnosis not present

## 2018-11-07 DIAGNOSIS — N189 Chronic kidney disease, unspecified: Secondary | ICD-10-CM | POA: Diagnosis not present

## 2018-11-08 DIAGNOSIS — H353211 Exudative age-related macular degeneration, right eye, with active choroidal neovascularization: Secondary | ICD-10-CM | POA: Diagnosis not present

## 2018-11-09 DIAGNOSIS — N189 Chronic kidney disease, unspecified: Secondary | ICD-10-CM | POA: Diagnosis not present

## 2018-11-09 DIAGNOSIS — I129 Hypertensive chronic kidney disease with stage 1 through stage 4 chronic kidney disease, or unspecified chronic kidney disease: Secondary | ICD-10-CM | POA: Diagnosis not present

## 2018-11-09 DIAGNOSIS — Z95820 Peripheral vascular angioplasty status with implants and grafts: Secondary | ICD-10-CM | POA: Diagnosis not present

## 2018-11-09 DIAGNOSIS — I70222 Atherosclerosis of native arteries of extremities with rest pain, left leg: Secondary | ICD-10-CM | POA: Diagnosis not present

## 2018-11-09 DIAGNOSIS — E1151 Type 2 diabetes mellitus with diabetic peripheral angiopathy without gangrene: Secondary | ICD-10-CM | POA: Diagnosis not present

## 2018-11-09 DIAGNOSIS — Z48812 Encounter for surgical aftercare following surgery on the circulatory system: Secondary | ICD-10-CM | POA: Diagnosis not present

## 2018-11-16 DIAGNOSIS — Z95820 Peripheral vascular angioplasty status with implants and grafts: Secondary | ICD-10-CM | POA: Diagnosis not present

## 2018-11-16 DIAGNOSIS — I129 Hypertensive chronic kidney disease with stage 1 through stage 4 chronic kidney disease, or unspecified chronic kidney disease: Secondary | ICD-10-CM | POA: Diagnosis not present

## 2018-11-16 DIAGNOSIS — N189 Chronic kidney disease, unspecified: Secondary | ICD-10-CM | POA: Diagnosis not present

## 2018-11-16 DIAGNOSIS — I70222 Atherosclerosis of native arteries of extremities with rest pain, left leg: Secondary | ICD-10-CM | POA: Diagnosis not present

## 2018-11-16 DIAGNOSIS — Z48812 Encounter for surgical aftercare following surgery on the circulatory system: Secondary | ICD-10-CM | POA: Diagnosis not present

## 2018-11-16 DIAGNOSIS — E1151 Type 2 diabetes mellitus with diabetic peripheral angiopathy without gangrene: Secondary | ICD-10-CM | POA: Diagnosis not present

## 2018-11-18 ENCOUNTER — Other Ambulatory Visit (INDEPENDENT_AMBULATORY_CARE_PROVIDER_SITE_OTHER): Payer: Self-pay | Admitting: Vascular Surgery

## 2018-11-18 DIAGNOSIS — Z95820 Peripheral vascular angioplasty status with implants and grafts: Secondary | ICD-10-CM

## 2018-11-18 DIAGNOSIS — I70222 Atherosclerosis of native arteries of extremities with rest pain, left leg: Secondary | ICD-10-CM

## 2018-11-21 ENCOUNTER — Ambulatory Visit (INDEPENDENT_AMBULATORY_CARE_PROVIDER_SITE_OTHER): Payer: Medicare Other | Admitting: Vascular Surgery

## 2018-11-21 ENCOUNTER — Ambulatory Visit (INDEPENDENT_AMBULATORY_CARE_PROVIDER_SITE_OTHER): Payer: Medicare Other

## 2018-11-21 ENCOUNTER — Encounter (INDEPENDENT_AMBULATORY_CARE_PROVIDER_SITE_OTHER): Payer: Self-pay | Admitting: Vascular Surgery

## 2018-11-21 VITALS — BP 162/51 | HR 70 | Resp 16 | Ht 61.5 in | Wt 117.0 lb

## 2018-11-21 DIAGNOSIS — I6523 Occlusion and stenosis of bilateral carotid arteries: Secondary | ICD-10-CM

## 2018-11-21 DIAGNOSIS — E1165 Type 2 diabetes mellitus with hyperglycemia: Secondary | ICD-10-CM

## 2018-11-21 DIAGNOSIS — Z95820 Peripheral vascular angioplasty status with implants and grafts: Secondary | ICD-10-CM

## 2018-11-21 DIAGNOSIS — Z794 Long term (current) use of insulin: Secondary | ICD-10-CM

## 2018-11-21 DIAGNOSIS — I70222 Atherosclerosis of native arteries of extremities with rest pain, left leg: Secondary | ICD-10-CM

## 2018-11-21 DIAGNOSIS — E1142 Type 2 diabetes mellitus with diabetic polyneuropathy: Secondary | ICD-10-CM

## 2018-11-21 DIAGNOSIS — I70212 Atherosclerosis of native arteries of extremities with intermittent claudication, left leg: Secondary | ICD-10-CM

## 2018-11-21 DIAGNOSIS — E782 Mixed hyperlipidemia: Secondary | ICD-10-CM

## 2018-11-21 DIAGNOSIS — I2510 Atherosclerotic heart disease of native coronary artery without angina pectoris: Secondary | ICD-10-CM

## 2018-11-21 DIAGNOSIS — IMO0002 Reserved for concepts with insufficient information to code with codable children: Secondary | ICD-10-CM

## 2018-11-21 DIAGNOSIS — I10 Essential (primary) hypertension: Secondary | ICD-10-CM

## 2018-11-21 DIAGNOSIS — Z7982 Long term (current) use of aspirin: Secondary | ICD-10-CM

## 2018-11-23 ENCOUNTER — Encounter (INDEPENDENT_AMBULATORY_CARE_PROVIDER_SITE_OTHER): Payer: Self-pay | Admitting: Vascular Surgery

## 2018-11-23 NOTE — Progress Notes (Signed)
MRN : [id number]  Jill Hudson is a 84 y.o. ([birth year]) female who presents with chief complaint of  Chief Complaint  Patient presents with  . Follow-up  .  History of Present Illness:   The patient is seen for follow up evaluation of carotid stenosis. The carotid stenosis followed by ultrasound.   The patient denies amaurosis fugax. There is no recent history of TIA symptoms or focal motor deficits. There is no prior documented CVA.  The patient is taking enteric-coated aspirin 81 mg daily.  The patient also presents today for followup and review of the noninvasive studies. There have been no interval changes in lower extremity symptoms. No interval shortening of the patient's claudication distance or development of rest pain symptoms. No new ulcers or wounds have occurred since the last visit.  The patient has a history of coronary artery disease, no recent episodes of angina or shortness of breath. There is a history of hyperlipidemia which is being treated with a statin  ABI Rt=1.32 and Lt=1.16  (previous ABI's Rt=1.07 and Lt=0.93) Duplex ultrasound of the carotid arteries shows RICA 90-30% stenosis and LICA <09% stenosis.  No change compared to the last studies   Current Meds  Medication Sig  . acetaminophen (TYLENOL) 650 MG CR tablet Take 1,300 mg by mouth every 8 (eight) hours as needed for pain.  Marland Kitchen amLODipine (NORVASC) 2.5 MG tablet Take 2.5 mg by mouth daily.  Marland Kitchen aspirin 81 MG tablet Take 81 mg by mouth daily.   . B Complex Vitamins (VITAMIN-B COMPLEX) TABS Take 1 tablet by mouth daily.   . B-D ULTRAFINE III SHORT PEN 31G X 8 MM MISC   . baclofen (LIORESAL) 10 MG tablet TAKE 1 TABLET(10 MG) BY MOUTH THREE TIMES DAILY (Patient taking differently: Take 10 mg by mouth at bedtime. )  . Biotin 10 MG TABS Take 10 mg by mouth daily.   Marland Kitchen CALCIUM-MAGNESIUM-ZINC PO Take 1 tablet by mouth daily.   . chlorthalidone (HYGROTON) 25 MG tablet Take 25 mg by mouth daily.  .  clopidogrel (PLAVIX) 75 MG tablet Take 1 tablet (75 mg total) by mouth daily.  . Cyanocobalamin (RA VITAMIN B-12 TR) 1000 MCG TBCR Take 1,000 mcg by mouth daily.   Marland Kitchen diltiazem (CARTIA XT) 180 MG 24 hr capsule Take 180 mg by mouth 2 (two) times daily.   . ferrous sulfate 325 (65 FE) MG tablet Take 325 mg by mouth daily with breakfast.  . insulin aspart (NOVOLOG FLEXPEN) 100 UNIT/ML FlexPen Inject 4 Units into the skin daily as needed. At lunch  . Insulin Glargine (BASAGLAR KWIKPEN) 100 UNIT/ML SOPN Inject 12 Units into the skin daily.  Marland Kitchen loratadine (CLARITIN) 10 MG tablet Take 10 mg by mouth daily as needed for allergies.   Marland Kitchen losartan (COZAAR) 50 MG tablet Take 50 mg by mouth 2 (two) times daily.   . metFORMIN (GLUCOPHAGE-XR) 500 MG 24 hr tablet TAKE ONE TABLET BY MOUTH TWICE DAILY (Patient taking differently: Take 500 mg by mouth 2 (two) times daily. )  . Multiple Vitamins-Minerals (MULTIVITAMIN PO) Take 1 tablet by mouth daily.  . Multiple Vitamins-Minerals (PRESERVISION AREDS PO) Take 1 capsule by mouth 2 (two) times daily.   . nitroGLYCERIN (NITROSTAT) 0.4 MG SL tablet 1 tab under the tongue for CP, may repeat in 5 minutes up to 3 doses  . rosuvastatin (CRESTOR) 5 MG tablet Take 5 mg by mouth every other day.     Past Medical History:  Diagnosis  Date  . Allergies   . Anxiety   . Arthritis    spine and shoulder  . Cancer ([place])    skin  . Diabetes mellitus without complication ([place])   . Heart murmur   . Hyperlipidemia   . Hypertension   . Macula lutea degeneration   . Mitral and aortic valve disease   . Myocardial infarction [facility])    may have had a "light" heart attack  . Occasional tremors   . PAD (peripheral artery disease) ([place])   . Shingles    patient unaware but daughter confirms. it was a long time ago  . Stroke [facility]) 01/2017   may have had a slight stroke  . TIA (transient ischemic attack) 01/2017  . UTI (urinary tract infection)   . Vascular disease, peripheral [facility])       Past Surgical History:  Procedure Laterality Date  . CARDIAC CATHETERIZATION  1998   40% LM, 95% Ramus interm  . CATARACT EXTRACTION, BILATERAL    . COLONOSCOPY WITH PROPOFOL N/A 08/12/2018   Procedure: COLONOSCOPY WITH PROPOFOL;  Surgeon: Lucilla Lame, MD;  Location: [facility] ENDOSCOPY;  Service: Endoscopy;  Laterality: N/A;  . ENDARTERECTOMY FEMORAL Left 10/12/2018   Procedure: ENDARTERECTOMY FEMORAL;  Surgeon: Katha Cabal, MD;  Location: [facility] ORS;  Service: Vascular;  Laterality: Left;  angioplasty and left SFA stent placement  . EYE SURGERY Bilateral    cataract extractions  . LOWER EXTREMITY ANGIOGRAPHY Left 08/23/2017   Procedure: Lower Extremity Angiography;  Surgeon: Algernon Huxley, MD;  Location: [place] CV LAB;  Service: Cardiovascular;  Laterality: Left;  . LOWER EXTREMITY ANGIOGRAPHY Left 07/26/2018   Procedure: LOWER EXTREMITY ANGIOGRAPHY;  Surgeon: Katha Cabal, MD;  Location: [place] CV LAB;  Service: Cardiovascular;  Laterality: Left;  . LOWER EXTREMITY ANGIOGRAPHY Left 09/16/2018   Procedure: LOWER EXTREMITY ANGIOGRAPHY;  Surgeon: Katha Cabal, MD;  Location: [place] CV LAB;  Service: Cardiovascular;  Laterality: Left;  . PTCA  08/2013   Left common iliac  . PTCA  12/2012   left ext iliac  . TUBAL LIGATION      Social History Social History   Tobacco Use  . Smoking status: Former Smoker    Packs/day: 2.00    Years: 37.00    Pack years: 74.00    Types: Cigarettes    Last attempt to quit: 1980    Years since quitting: 40.1  . Smokeless tobacco: Never Used  . Tobacco comment: smoking cessation materials not required  Substance Use Topics  . Alcohol use: No    Alcohol/week: 0.0 standard drinks  . Drug use: No    Family History Family History  Problem Relation Age of Onset  . Dementia Mother   . Diabetes Father     Allergies  Allergen Reactions  . Saxagliptin Diarrhea  . Epinephrine Other (See Comments)    Patient  does not remember what happens when she uses this  . Atorvastatin Other (See Comments)    Muscle aches  . Codeine Other (See Comments)    Upset stomach  . Ezetimibe Other (See Comments)    Myalgias(ZETIA)  . Limonene Rash    Patient does not recall this reaction  . Nitrofurantoin Rash and Other (See Comments)    Pruitus  . Sulfa Antibiotics Rash and Other (See Comments)    Sore mouth      REVIEW OF SYSTEMS (Negative unless checked)  Constitutional: [] Weight loss  [] Fever  [] Chills Cardiac: [] Chest pain   []   Chest pressure   [] Palpitations   [] Shortness of breath when laying flat   [] Shortness of breath with exertion. Vascular:  [x] Pain in legs with walking   [] Pain in legs at rest  [] History of DVT   [] Phlebitis   [] Swelling in legs   [] Varicose veins   [] Non-healing ulcers Pulmonary:   [] Uses home oxygen   [] Productive cough   [] Hemoptysis   [] Wheeze  [x] COPD   [] Asthma Neurologic:  [] Dizziness   [] Seizures   [] History of stroke   [x] History of TIA  [] Aphasia   [] Vissual changes   [] Weakness or numbness in arm   [] Weakness or numbness in leg Musculoskeletal:   [] Joint swelling   [x] Joint pain   [] Low back pain Hematologic:  [] Easy bruising  [] Easy bleeding   [] Hypercoagulable state   [] Anemic Gastrointestinal:  [] Diarrhea   [] Vomiting  [] Gastroesophageal reflux/heartburn   [] Difficulty swallowing. Genitourinary:  [] Chronic kidney disease   [] Difficult urination  [] Frequent urination   [] Blood in urine Skin:  [] Rashes   [] Ulcers  Psychological:  [] History of anxiety   []  History of major depression.  Physical Examination  Vitals:   11/21/18 1510  BP: (!) 162/51  Pulse: 70  Resp: 16  Weight: 117 lb (53.1 kg)  Height: 5' 1.5" (1.562 m)   Body mass index is 21.75 kg/m. Gen: WD/WN, NAD Head: [place]/AT, No temporalis wasting.  Ear/Nose/Throat: Hearing grossly intact, nares w/o erythema or drainage Eyes: PER, EOMI, sclera nonicteric.  Neck: Supple, no large masses.   Pulmonary:   Good air movement, no audible wheezing bilaterally, no use of accessory muscles.  Cardiac: RRR, no JVD Vascular:  Right carotid bruit Vessel Right Left  Radial Palpable Palpable  Brachial Palpable Palpable  Carotid Palpable Palpable  PT Trace Palpable Trace Palpable  DP Not Palpable Trace Palpable  Gastrointestinal: Non-distended. No guarding/no peritoneal signs.  Musculoskeletal: M/S 5/5 throughout.  No deformity or atrophy.  Neurologic: CN 2-12 intact. Symmetrical.  Speech is fluent. Motor exam as listed above. Psychiatric: Judgment intact, Mood & affect appropriate for pt's clinical situation. Dermatologic: No rashes or ulcers noted.  No changes consistent with cellulitis. Lymph : No lichenification or skin changes of chronic lymphedema.  CBC Lab Results  Component Value Date   WBC 7.1 11/02/2018   HGB 9.7 (L) 11/02/2018   HCT 31.8 (L) 11/02/2018   MCV 91.1 11/02/2018   PLT 345 11/02/2018    BMET    Component Value Date/Time   NA 135 11/02/2018 1557   NA 136 01/16/2016 1135   NA 138 05/29/2014 1202   K 4.7 11/02/2018 1557   K 5.1 05/29/2014 1202   CL 102 11/02/2018 1557   CL 106 05/29/2014 1202   CO2 22 11/02/2018 1557   CO2 25 05/29/2014 1202   GLUCOSE 219 (H) 11/02/2018 1557   GLUCOSE 269 (H) 05/29/2014 1202   BUN 20 11/02/2018 1557   BUN 19 01/16/2016 1135   BUN 19 (H) 05/29/2014 1202   CREATININE 0.98 11/02/2018 1557   CREATININE 1.10 05/29/2014 1202   CALCIUM 9.3 11/02/2018 1557   CALCIUM 9.1 05/29/2014 1202   GFRNONAA 54 (L) 11/02/2018 1557   GFRNONAA 48 (L) 05/29/2014 1202   GFRAA >60 11/02/2018 1557   GFRAA 56 (L) 05/29/2014 1202   Estimated Creatinine Clearance: 34.2 mL/min (by C-G formula based on SCr of 0.98 mg/dL).  COAG Lab Results  Component Value Date   INR 1.08 11/02/2018   INR 1.09 10/06/2018   INR 1.08 08/11/2018  Radiology Dg Chest 2 View  Result Date: 11/02/2018 CLINICAL DATA:  Increased swelling and pain in the left lower  extremity. Recent cardiac catheterization. EXAM: CHEST - 2 VIEW COMPARISON:  Thoracic spine radiographs 01/24/2018. Portable chest 05/03/2013. FINDINGS: The heart size and mediastinal contours are stable. There is heavy aortic atherosclerosis. The lungs are clear. There is no pleural effusion or pneumothorax. No acute osseous findings. There are degenerative changes throughout the spine associated with a convex left upper lumbar scoliosis. IMPRESSION: No evidence of active cardiopulmonary process. Aortic Atherosclerosis (ICD10-I70.0). Electronically Signed   By: Richardean Sale M.D.   On: 11/02/2018 16:18   US Venous Img Lower Unilateral Left  Result Date: 11/02/2018 CLINICAL DATA:  Left groin pain and edema and status post left femoral artery access with percutaneous coronary intervention on 10/10/2018. EXAM: LEFT LOWER EXTREMITY VENOUS DOPPLER ULTRASOUND TECHNIQUE: Gray-scale sonography with graded compression, as well as color Doppler and duplex ultrasound were performed to evaluate the lower extremity deep venous systems from the level of the common femoral vein and including the common femoral, femoral, profunda femoral, popliteal and calf veins including the posterior tibial, peroneal and gastrocnemius veins when visible. The superficial great saphenous vein was also interrogated. Spectral Doppler was utilized to evaluate flow at rest and with distal augmentation maneuvers in the common femoral, femoral and popliteal veins. COMPARISON:  None. FINDINGS: Contralateral Common Femoral Vein: Respiratory phasicity is normal and symmetric with the symptomatic side. No evidence of thrombus. Normal compressibility. Common Femoral Vein: No evidence of thrombus. Normal compressibility, respiratory phasicity and response to augmentation. Saphenofemoral Junction: No evidence of thrombus. Normal compressibility and flow on color Doppler imaging. Profunda Femoral Vein: No evidence of thrombus. Normal compressibility and  flow on color Doppler imaging. Femoral Vein: No evidence of thrombus. Normal compressibility, respiratory phasicity and response to augmentation. Popliteal Vein: No evidence of thrombus. Normal compressibility, respiratory phasicity and response to augmentation. Calf Veins: No evidence of thrombus. Normal compressibility and flow on color Doppler imaging. Superficial Great Saphenous Vein: No evidence of thrombus. Normal compressibility. Venous Reflux:  None. Other Findings:  No evidence of superficial thrombophlebitis. IMPRESSION: No evidence of left lower extremity deep venous thrombosis. Electronically Signed   By: Aletta Edouard M.D.   On: 11/02/2018 17:29   [place] Lower Ext Art Left Ltd  Result Date: 11/02/2018 CLINICAL DATA:  Left groin pain and edema. Status post left femoral artery EXAM: LEFT LOWER EXTREMITY ARTERIAL DUPLEX SCAN TECHNIQUE: Gray-scale sonography as well as color Doppler and duplex ultrasound was performed to evaluate the lower extremity arteries including the common, superficial and profunda femoral arteries, popliteal artery and calf arteries. COMPARISON:  None. FINDINGS: The left common femoral artery appears patent and demonstrates no evidence of pseudoaneurysm. There is no evidence of AV fistula. An elongated hypoechoic hematoma is seen anterior to the common femoral artery and region of the femoral bifurcation measuring approximately 7.1 x 2.3 x 3.3 cm. There is no blood flow demonstrated in the hematoma. IMPRESSION: No evidence of left femoral artery pseudoaneurysm. There is an elongated hematoma anterior to the left common femoral artery and femoral bifurcation measuring up to 7 cm in greatest estimated length. Electronically Signed   By: Aletta Edouard M.D.   On: 11/02/2018 17:40     Assessment/Plan 1. Atherosclerosis of native artery of left lower extremity with intermittent claudication (HCC)  Recommend:  The patient has evidence of atherosclerosis of the lower extremities  with claudication.  The patient does not voice lifestyle limiting changes  at this point in time.  Noninvasive studies do not suggest clinically significant change.  No invasive studies, angiography or surgery at this time The patient should continue walking and begin a more formal exercise program.  The patient should continue antiplatelet therapy and aggressive treatment of the lipid abnormalities  No changes in the patient's medications at this time  The patient should continue wearing graduated compression socks 10-15 mmHg strength to control the mild edema.   - VAS [place] ABI WITH/WO TBI; Future  2. Bilateral carotid artery stenosis Recommend:  Given the patient's asymptomatic subcritical stenosis no further invasive testing or surgery at this time.  Continue antiplatelet therapy as prescribed Continue management of CAD, HTN and Hyperlipidemia Healthy heart diet,  encouraged exercise at least 4 times per week Follow up in 12 months with duplex ultrasound and physical exam   - VAS US CAROTID; Future  3. Arteriosclerosis of coronary artery Continue cardiac and antihypertensive medications as already ordered and reviewed, no changes at this time.  Continue statin as ordered and reviewed, no changes at this time  Nitrates PRN for chest pain   4. Essential (primary) hypertension Continue antihypertensive medications as already ordered, these medications have been reviewed and there are no changes at this time.   5. Uncontrolled type 2 diabetes mellitus with diabetic polyneuropathy, with long-term current use of insulin ([place]) Continue hypoglycemic medications as already ordered, these medications have been reviewed and there are no changes at this time.  Hgb A1C to be monitored as already arranged by primary service   6. Mixed hyperlipidemia Continue statin as ordered and reviewed, no changes at this time     Hortencia Pilar, MD  11/23/2018 8:01 AM

## 2018-11-24 DIAGNOSIS — E1151 Type 2 diabetes mellitus with diabetic peripheral angiopathy without gangrene: Secondary | ICD-10-CM | POA: Diagnosis not present

## 2018-11-24 DIAGNOSIS — N189 Chronic kidney disease, unspecified: Secondary | ICD-10-CM | POA: Diagnosis not present

## 2018-11-24 DIAGNOSIS — I129 Hypertensive chronic kidney disease with stage 1 through stage 4 chronic kidney disease, or unspecified chronic kidney disease: Secondary | ICD-10-CM | POA: Diagnosis not present

## 2018-11-24 DIAGNOSIS — Z48812 Encounter for surgical aftercare following surgery on the circulatory system: Secondary | ICD-10-CM | POA: Diagnosis not present

## 2018-11-24 DIAGNOSIS — I70222 Atherosclerosis of native arteries of extremities with rest pain, left leg: Secondary | ICD-10-CM | POA: Diagnosis not present

## 2018-11-24 DIAGNOSIS — Z95820 Peripheral vascular angioplasty status with implants and grafts: Secondary | ICD-10-CM | POA: Diagnosis not present

## 2018-11-25 ENCOUNTER — Telehealth (INDEPENDENT_AMBULATORY_CARE_PROVIDER_SITE_OTHER): Payer: Self-pay

## 2018-11-25 ENCOUNTER — Other Ambulatory Visit: Payer: Self-pay

## 2018-11-25 ENCOUNTER — Ambulatory Visit: Payer: Medicare Other

## 2018-11-25 ENCOUNTER — Encounter: Payer: Self-pay | Admitting: Emergency Medicine

## 2018-11-25 ENCOUNTER — Ambulatory Visit
Admission: EM | Admit: 2018-11-25 | Discharge: 2018-11-25 | Disposition: A | Discharge status: Home / Self Care | Payer: Medicare Other | Attending: Family Medicine | Admitting: Family Medicine

## 2018-11-25 DIAGNOSIS — M25562 Pain in left knee: Secondary | ICD-10-CM | POA: Diagnosis not present

## 2018-11-25 DIAGNOSIS — M25552 Pain in left hip: Secondary | ICD-10-CM | POA: Diagnosis not present

## 2018-11-25 DIAGNOSIS — M1612 Unilateral primary osteoarthritis, left hip: Secondary | ICD-10-CM | POA: Diagnosis not present

## 2018-11-25 MED ORDER — TRAMADOL HCL 50 MG PO TABS: 50.0000 mg | ORAL_TABLET | Freq: Three times a day (TID) | ORAL | 0 refills | Status: DC | PRN

## 2018-11-25 NOTE — Telephone Encounter (Signed)
Judson Roch Gains called regarding the patient stating that late yesterday that she and the patient heard a loud pop in her hip.  This morning the patient cannot walk even with a walker a and has pain from her buttock down her leg. Judson Roch also stated that her groin area is swollen again as well. Patient underwent a left femoral endarterectomy with Dr. [name] on 10/12/18. Patient was also seen on 11/21/2018 and had no swelling or any other issues. I advised that the patient needs to see her PCP or Orthopedist for the pop in her hip and pain from her buttock down her leg. I also stated that if after being seen the PCP feels its Vascular in nature then they will send over a referral. I spoke with Marcelle Overlie PA regarding this matter.

## 2018-11-25 NOTE — ED Provider Notes (Signed)
[facility]    CSN: [id number] Arrival date & time: 11/25/18  1153  History   Chief Complaint Chief Complaint  Patient presents with  . Leg Pain    left   HPI  84 year old female with peripheral vascular disease and other comorbidities presents with left knee pain.  Patient reports that she was walking and developed left knee pain.  She states that she felt a "pop".  Patient reports that she has had significant pain since then. She states that her pain is starting to go down her leg as well as up towards her hip.  Severe.  Symptoms worse with activity.  She has taken oxycodone without significant improvement.  She also reports lower extremity edema.  She states that she has had this since she had a vascular stent placed.  No other associated symptoms.  No other complaints.  PMH, Surgical Hx, Family Hx, Social History reviewed and updated as below.  Past Medical History:  Diagnosis Date  . Allergies   . Anxiety   . Arthritis    spine and shoulder  . Cancer ([place])    skin  . Diabetes mellitus without complication ([place])   . Heart murmur   . Hyperlipidemia   . Hypertension   . Macula lutea degeneration   . Mitral and aortic valve disease   . Myocardial infarction [employer])    may have had a "light" heart attack  . Occasional tremors   . PAD (peripheral artery disease) ([place])   . Shingles    patient unaware but daughter confirms. it was a long time ago  . Stroke [facility]) 01/2017   may have had a slight stroke  . TIA (transient ischemic attack) 01/2017  . UTI (urinary tract infection)   . Vascular disease, peripheral [facility])     Patient Active Problem List   Diagnosis Date Noted  . Encounter for long-term (current) use of aspirin 10/31/2018  . Encounter for long-term (current) use of antiplatelets/antithrombotics 10/31/2018  . Long term current use of oral hypoglycemic drug 10/31/2018  . Encounter for long-term (current) use of insulin ([place]) 10/31/2018  .  Atherosclerosis of artery of extremity with rest pain ([place]) 10/12/2018  . GIB (gastrointestinal bleeding) 08/11/2018  . Leg pain 07/03/2017  . Carpal tunnel syndrome on both sides 05/05/2017  . Myalgia due to HMG CoA reductase inhibitor 05/05/2017  . CVA (cerebral vascular accident) ([place]) 02/15/2017  . Degenerative disc disease, lumbar 12/21/2016  . Atherosclerosis of native arteries of extremity with intermittent claudication ([place]) 12/20/2016  . Bilateral carotid artery stenosis 12/20/2016  . Hip bursitis 05/18/2016  . Elevated TSH 01/18/2016  . Chronic renal insufficiency, stage III (moderate) ([place]) 01/16/2016  . Senile ecchymosis 01/16/2016  . Uncontrolled type 2 diabetes mellitus with diabetic polyneuropathy, with long-term current use of insulin ([place]) 09/16/2015  . GERD (gastroesophageal reflux disease) 07/24/2015  . PAD (peripheral artery disease) ([place]) 05/17/2015  . Neoplasm of uncertain behavior of skin 05/17/2015  . Retinopathy, diabetic, proliferative ([place]) 04/11/2015  . Mixed hyperlipidemia 04/11/2015  . Essential (primary) hypertension 04/11/2015  . Generalized OA 04/11/2015  . Arteriosclerosis of coronary artery 05/29/2013  . Hypertensive heart disease without CHF 05/29/2013    Past Surgical History:  Procedure Laterality Date  . CARDIAC CATHETERIZATION  1998   40% LM, 95% Ramus interm  . CATARACT EXTRACTION, BILATERAL    . COLONOSCOPY WITH PROPOFOL N/A 08/12/2018   Procedure: COLONOSCOPY WITH PROPOFOL;  Surgeon: Lucilla Lame, MD;  Location: [facility] ENDOSCOPY;  Service: Endoscopy;  Laterality: N/A;  . ENDARTERECTOMY FEMORAL Left 10/12/2018   Procedure: ENDARTERECTOMY FEMORAL;  Surgeon: Katha Cabal, MD;  Location: [facility] ORS;  Service: Vascular;  Laterality: Left;  angioplasty and left SFA stent placement  . EYE SURGERY Bilateral    cataract extractions  . LOWER EXTREMITY ANGIOGRAPHY Left 08/23/2017   Procedure: Lower Extremity Angiography;  Surgeon: Algernon Huxley,  MD;  Location: [place] CV LAB;  Service: Cardiovascular;  Laterality: Left;  . LOWER EXTREMITY ANGIOGRAPHY Left 07/26/2018   Procedure: LOWER EXTREMITY ANGIOGRAPHY;  Surgeon: Katha Cabal, MD;  Location: [place] [facility];  Service: Cardiovascular;  Laterality: Left;  . LOWER EXTREMITY ANGIOGRAPHY Left 09/16/2018   Procedure: LOWER EXTREMITY ANGIOGRAPHY;  Surgeon: Katha Cabal, MD;  Location: [place] [facility];  Service: Cardiovascular;  Laterality: Left;  . PTCA  08/2013   Left common iliac  . PTCA  12/2012   left ext iliac  . TUBAL LIGATION      OB History   No obstetric history on file.      Home Medications    Prior to Admission medications   Medication Sig Start Date End Date Taking? Authorizing Provider  acetaminophen (TYLENOL) 650 MG CR tablet Take 1,300 mg by mouth every 8 (eight) hours as needed for pain.   Yes [provider]  amLODipine (NORVASC) 2.5 MG tablet Take 2.5 mg by mouth daily. 05/20/18  Yes [provider]  aspirin 81 MG tablet Take 81 mg by mouth daily.    Yes [provider]  B Complex Vitamins (VITAMIN-B COMPLEX) TABS Take 1 tablet by mouth daily.    Yes [provider]  B-D ULTRAFINE III SHORT PEN 31G X 8 MM MISC  09/27/18  Yes [provider]  baclofen (LIORESAL) 10 MG tablet TAKE 1 TABLET(10 MG) BY MOUTH THREE TIMES DAILY Patient taking differently: Take 10 mg by mouth at bedtime.  02/09/18  Yes [name], MD  Biotin 10 MG TABS Take 10 mg by mouth daily.    Yes [provider]  CALCIUM-MAGNESIUM-ZINC PO Take 1 tablet by mouth daily.    Yes [provider]  chlorthalidone (HYGROTON) 25 MG tablet Take 25 mg by mouth daily.   Yes [provider]  clopidogrel (PLAVIX) 75 MG tablet Take 1 tablet (75 mg total) by mouth daily. 07/27/18  Yes Schnier, Dolores Lory, MD  Cyanocobalamin (RA VITAMIN B-12 TR) 1000 MCG TBCR Take 1,000 mcg by mouth daily.    Yes [provider]  diltiazem (CARTIA XT) 180 MG 24 hr capsule Take 180 mg by mouth 2 (two) times daily.    Yes [provider]  ferrous sulfate 325 (65 FE) MG tablet Take 325 mg by mouth daily with breakfast.   Yes [provider]  insulin aspart (NOVOLOG FLEXPEN) 100 UNIT/ML FlexPen Inject 4 Units into the skin daily as needed. At lunch 03/26/17  Yes [provider]  Insulin Glargine (BASAGLAR KWIKPEN) 100 UNIT/ML SOPN Inject 12 Units into the skin daily.   Yes [provider]  losartan (COZAAR) 50 MG tablet Take 50 mg by mouth 2 (two) times daily.  06/20/18  Yes [provider]  metFORMIN (GLUCOPHAGE-XR) 500 MG 24 hr tablet TAKE ONE TABLET BY MOUTH TWICE DAILY Patient taking differently: Take 500 mg by mouth 2 (two) times daily.  01/26/18  Yes [name], MD  Multiple Vitamins-Minerals (MULTIVITAMIN PO) Take 1 tablet by mouth daily.   Yes [provider]  Multiple Vitamins-Minerals (PRESERVISION AREDS PO) Take 1 capsule by mouth 2 (two) times daily.    Yes [provider]  nitroGLYCERIN (NITROSTAT) 0.4 MG SL tablet 1 tab under the tongue for CP, may repeat in 5 minutes up to 3 doses 09/07/18  Yes [name], MD  rosuvastatin (CRESTOR) 5 MG tablet Take 5 mg by mouth every other day.  03/02/18  Yes [provider]  traMADol (ULTRAM) 50 MG tablet Take 1 tablet (50 mg total) by mouth every 8 (eight) hours as needed. 11/25/18   Coral Spikes, DO    Family History Family History  Problem Relation Age of Onset  . Dementia Mother   . Diabetes Father     Social History Social History   Tobacco Use  . Smoking status: Former Smoker    Packs/day: 2.00    Years: 37.00    Pack years: 74.00    Types: Cigarettes    Last attempt to quit: 1980    Years since quitting: 40.1  . Smokeless tobacco: Never Used  . Tobacco comment: smoking cessation materials not required  Substance Use Topics  . Alcohol use: No    Alcohol/week:  0.0 standard drinks  . Drug use: No     Allergies   Saxagliptin; Epinephrine; Atorvastatin; Codeine; Ezetimibe; Limonene; Nitrofurantoin; and Sulfa antibiotics   Review of Systems Review of Systems  Cardiovascular: Positive for leg swelling.  Musculoskeletal:       Left leg pain.   Physical Exam Triage Vital Signs ED Triage Vitals  Enc Vitals Group     BP 11/25/18 1234 (!) 185/61     Pulse Rate 11/25/18 1234 72     Resp 11/25/18 1234 18     Temp 11/25/18 1234 97.6 F (36.4 C)     Temp Source 11/25/18 1234 Oral     SpO2 11/25/18 1234 100 %     Weight 11/25/18 1232 117 lb (53.1 kg)     Height 11/25/18 1232 5\' 1"  (1.549 m)     Head Circumference --      Peak Flow --      Pain Score 11/25/18 1231 8     Pain Loc --      Pain Edu? --      Excl. in [place]? --    Updated Vital Signs BP (!) 185/61 (BP Location: Left Arm)   Pulse 72   Temp 97.6 F (36.4 C) (Oral)   Resp 18   Ht 5\' 1"  (1.549 m)   Wt 53.1 kg   SpO2 100%   BMI 22.11 kg/m   Visual Acuity Right Eye Distance:   Left Eye Distance:   Bilateral Distance:    Right Eye Near:   Left Eye Near:    Bilateral Near:     Physical Exam Vitals signs and nursing note reviewed.  Constitutional:      General: She is not in acute distress.    Appearance: Normal appearance.  HENT:     Head: Normocephalic and atraumatic.     Nose: Nose normal.  Eyes:     General: No scleral icterus.    Conjunctiva/sclera: Conjunctivae normal.  Cardiovascular:     Rate and Rhythm: Normal rate and regular rhythm.  Pulmonary:     Effort: Pulmonary effort is normal.     Breath sounds: No wheezing, rhonchi or rales.  Musculoskeletal:     Comments: Left knee -patient with medial joint line tenderness palpation.  Ligaments intact.  Left hip -greater  trochanter tenderness to palpation.  Neurological:     Mental Status: She is alert.  Psychiatric:        Mood and Affect: Mood normal.        Behavior: Behavior normal.    UC  Treatments / Results  Labs (all labs ordered are listed, but only abnormal results are displayed) Labs Reviewed - No data to display  EKG None  Radiology Dg Knee Complete 4 Views Left  Result Date: 11/25/2018 CLINICAL DATA:  Pt states she went tot stand up last pm and felt pop in left knee. Most pain medial anterior joint of knee and post lat side of knee as well. Recent vein bypass dec 18th 2019. Pt can not stretch leg out laying down. Had to sit her up flat and do AP and both obliques. Pain in hip was unbearable to put knee on table while lying down. Pain lat side of left hip EXAM: LEFT KNEE - COMPLETE 4+ VIEW COMPARISON:  None. FINDINGS: No fracture.  No bone lesion. Knee joint is normally spaced and aligned. No arthropathic changes. No joint effusion. Posterior vascular stent extends across the mid to posterior thigh to popliteal fossa. IMPRESSION: 1. No fracture, bone lesion or knee joint abnormality. Electronically Signed   By: Lajean Manes M.D.   On: 11/25/2018 14:03   Dg Hip Unilat W Or Wo Pelvis 2-3 Views Left  Result Date: 11/25/2018 CLINICAL DATA:  Pop in left knee. EXAM: DG HIP (WITH OR WITHOUT PELVIS) 2-3V LEFT COMPARISON:  No recent. FINDINGS: Degenerative change lumbar spine and both hips. No acute bony abnormality identified. No evidence of fracture dislocation. Bilateral iliac left femoral stents are noted. Peripheral vascular calcification. Pelvic calcifications consistent phleboliths. IMPRESSION: 1. Degenerative changes lumbar spine and both hips. No acute abnormality. 2. Bilateral iliac and left femoral stents noted. Peripheral vascular disease. Electronically Signed   By: Marcello Moores  Register   On: 11/25/2018 14:21    Procedures Procedures (including critical care time)  Medications Ordered in UC Medications - No data to display  Initial Impression / Assessment and Plan / UC Course  I have reviewed the triage vital signs and the nursing notes.  Pertinent labs & imaging  results that were available during my care of the patient were reviewed by me and considered in my medical decision making (see chart for details).    84 year old female presents with knee pain and hip pain.  Medial joint line tenderness, concern for meniscal injury.  X-ray of the knee was negative.  Patient has some arthritis of the hip.  Treating with tramadol.  Advised to see orthopedics if she fails improve or worsens.  Final Clinical Impressions(s) / UC Diagnoses   Final diagnoses:  Acute pain of left knee  Left hip pain     Discharge Instructions     Rest.  Medication as prescribed.  If persists, see Ortho ([facility] or [facility]).  Take care  Dr. Lacinda Axon    ED Prescriptions    Medication Sig Dispense Auth. Provider   traMADol (ULTRAM) 50 MG tablet Take 1 tablet (50 mg total) by mouth every 8 (eight) hours as needed. 10 tablet Coral Spikes, DO     Controlled Substance Prescriptions [place] Controlled Substance Registry consulted? Not Applicable   Coral Spikes, DO 11/25/18 1455

## 2018-11-25 NOTE — ED Triage Notes (Signed)
Patient in today c/o left leg pain since last night. Patient states that her knee "popped" last night and her leg has been hurting ever since.

## 2018-11-25 NOTE — Telephone Encounter (Signed)
Patient called twice and left a message on the triage line both times and stated that she needed a call back to leg pain and swelling stating that it was excruciating. Per telephone call dated today, 11/25/2018, patient was given instructions from [place], [place] by Mickel Baas, CMA. No need to call patient back at this time because she has already been spoken to.

## 2018-11-25 NOTE — Discharge Instructions (Signed)
Rest.  Medication as prescribed.  If persists, see Ortho ([facility] or [facility]).  Take care  Dr. Lacinda Axon

## 2018-11-28 DIAGNOSIS — Z794 Long term (current) use of insulin: Secondary | ICD-10-CM | POA: Diagnosis not present

## 2018-11-28 DIAGNOSIS — M25362 Other instability, left knee: Secondary | ICD-10-CM | POA: Diagnosis not present

## 2018-11-28 DIAGNOSIS — M2352 Chronic instability of knee, left knee: Secondary | ICD-10-CM | POA: Diagnosis not present

## 2018-11-28 DIAGNOSIS — E1142 Type 2 diabetes mellitus with diabetic polyneuropathy: Secondary | ICD-10-CM | POA: Diagnosis not present

## 2018-11-28 DIAGNOSIS — M2392 Unspecified internal derangement of left knee: Secondary | ICD-10-CM | POA: Diagnosis not present

## 2018-11-30 ENCOUNTER — Other Ambulatory Visit: Payer: Self-pay | Admitting: Orthopedic Surgery

## 2018-11-30 ENCOUNTER — Other Ambulatory Visit (HOSPITAL_COMMUNITY): Payer: Self-pay | Admitting: Orthopedic Surgery

## 2018-11-30 DIAGNOSIS — M2392 Unspecified internal derangement of left knee: Secondary | ICD-10-CM

## 2018-11-30 DIAGNOSIS — M25362 Other instability, left knee: Secondary | ICD-10-CM

## 2018-12-01 ENCOUNTER — Other Ambulatory Visit (INDEPENDENT_AMBULATORY_CARE_PROVIDER_SITE_OTHER): Payer: Self-pay | Admitting: Nurse Practitioner

## 2018-12-01 ENCOUNTER — Telehealth (INDEPENDENT_AMBULATORY_CARE_PROVIDER_SITE_OTHER): Payer: Self-pay | Admitting: Vascular Surgery

## 2018-12-01 DIAGNOSIS — E1151 Type 2 diabetes mellitus with diabetic peripheral angiopathy without gangrene: Secondary | ICD-10-CM | POA: Diagnosis not present

## 2018-12-01 DIAGNOSIS — N189 Chronic kidney disease, unspecified: Secondary | ICD-10-CM | POA: Diagnosis not present

## 2018-12-01 DIAGNOSIS — I70212 Atherosclerosis of native arteries of extremities with intermittent claudication, left leg: Secondary | ICD-10-CM

## 2018-12-01 DIAGNOSIS — I70222 Atherosclerosis of native arteries of extremities with rest pain, left leg: Secondary | ICD-10-CM | POA: Diagnosis not present

## 2018-12-01 DIAGNOSIS — I129 Hypertensive chronic kidney disease with stage 1 through stage 4 chronic kidney disease, or unspecified chronic kidney disease: Secondary | ICD-10-CM | POA: Diagnosis not present

## 2018-12-01 DIAGNOSIS — Z48812 Encounter for surgical aftercare following surgery on the circulatory system: Secondary | ICD-10-CM | POA: Diagnosis not present

## 2018-12-01 DIAGNOSIS — Z95820 Peripheral vascular angioplasty status with implants and grafts: Secondary | ICD-10-CM | POA: Diagnosis not present

## 2018-12-01 MED ORDER — TRAMADOL HCL 50 MG PO TABS: 50.0000 mg | ORAL_TABLET | Freq: Four times a day (QID) | ORAL | 0 refills | Status: DC | PRN

## 2018-12-01 NOTE — Telephone Encounter (Signed)
Pt left vm on nurse line requesting a refill of  oxyCODONE-acetaminophen (PERCOCET) 5-325 MG Per pt she is still having pain in her leg and she only has 2 pills left. Reviewing her past hx looks like this was last filled on 10/17/18 for qty of #60 to take 1-2 tablets every 6 hours prn

## 2018-12-01 NOTE — Telephone Encounter (Signed)
  [facility] [place], [facility] [address] [place] [address] Phone: [phone] Fax: [phone]  Patient agrees to Tramadol.

## 2018-12-01 NOTE — Telephone Encounter (Signed)
I spoke with Dr. [name] and he said that he will not refill the Percocet.  Looking at her previous studies, she had excellent blood flow, which would not be causing pain.  She may still be having some surgical site pain, so he said that we can do a one time RX of tramadol.  Please see if she would like that and verify her pharmacy.

## 2018-12-04 ENCOUNTER — Ambulatory Visit
Admission: RE | Admit: 2018-12-04 | Discharge: 2018-12-04 | Disposition: A | Discharge status: Home / Self Care | Payer: Medicare Other | Source: Ambulatory Visit | Attending: Orthopedic Surgery | Admitting: Orthopedic Surgery

## 2018-12-04 DIAGNOSIS — M2392 Unspecified internal derangement of left knee: Secondary | ICD-10-CM

## 2018-12-04 DIAGNOSIS — M25362 Other instability, left knee: Secondary | ICD-10-CM

## 2018-12-07 DIAGNOSIS — G8929 Other chronic pain: Secondary | ICD-10-CM | POA: Diagnosis not present

## 2018-12-07 DIAGNOSIS — M5442 Lumbago with sciatica, left side: Secondary | ICD-10-CM | POA: Diagnosis not present

## 2018-12-07 DIAGNOSIS — M25552 Pain in left hip: Secondary | ICD-10-CM | POA: Diagnosis not present

## 2018-12-08 ENCOUNTER — Other Ambulatory Visit: Payer: Self-pay | Admitting: Orthopedic Surgery

## 2018-12-08 ENCOUNTER — Other Ambulatory Visit (HOSPITAL_COMMUNITY): Payer: Self-pay | Admitting: Orthopedic Surgery

## 2018-12-08 DIAGNOSIS — M5442 Lumbago with sciatica, left side: Principal | ICD-10-CM

## 2018-12-08 DIAGNOSIS — G8929 Other chronic pain: Secondary | ICD-10-CM

## 2018-12-12 ENCOUNTER — Ambulatory Visit
Admission: RE | Admit: 2018-12-12 | Discharge: 2018-12-12 | Disposition: A | Discharge status: Home / Self Care | Payer: Medicare Other | Source: Ambulatory Visit | Attending: Orthopedic Surgery | Admitting: Orthopedic Surgery

## 2018-12-12 DIAGNOSIS — G8929 Other chronic pain: Secondary | ICD-10-CM | POA: Insufficient documentation

## 2018-12-12 DIAGNOSIS — M5442 Lumbago with sciatica, left side: Secondary | ICD-10-CM | POA: Diagnosis not present

## 2018-12-12 DIAGNOSIS — R296 Repeated falls: Secondary | ICD-10-CM | POA: Diagnosis not present

## 2018-12-12 DIAGNOSIS — M4807 Spinal stenosis, lumbosacral region: Secondary | ICD-10-CM | POA: Diagnosis not present

## 2018-12-12 DIAGNOSIS — M5126 Other intervertebral disc displacement, lumbar region: Secondary | ICD-10-CM | POA: Diagnosis not present

## 2018-12-15 DIAGNOSIS — Z48812 Encounter for surgical aftercare following surgery on the circulatory system: Secondary | ICD-10-CM | POA: Diagnosis not present

## 2018-12-15 DIAGNOSIS — E1151 Type 2 diabetes mellitus with diabetic peripheral angiopathy without gangrene: Secondary | ICD-10-CM | POA: Diagnosis not present

## 2018-12-15 DIAGNOSIS — N189 Chronic kidney disease, unspecified: Secondary | ICD-10-CM | POA: Diagnosis not present

## 2018-12-15 DIAGNOSIS — Z95820 Peripheral vascular angioplasty status with implants and grafts: Secondary | ICD-10-CM | POA: Diagnosis not present

## 2018-12-15 DIAGNOSIS — I129 Hypertensive chronic kidney disease with stage 1 through stage 4 chronic kidney disease, or unspecified chronic kidney disease: Secondary | ICD-10-CM | POA: Diagnosis not present

## 2018-12-15 DIAGNOSIS — I70222 Atherosclerosis of native arteries of extremities with rest pain, left leg: Secondary | ICD-10-CM | POA: Diagnosis not present

## 2018-12-16 ENCOUNTER — Ambulatory Visit: Payer: Medicare Other | Admitting: Internal Medicine

## 2018-12-19 ENCOUNTER — Ambulatory Visit: Payer: Medicare Other | Admitting: Internal Medicine

## 2018-12-19 ENCOUNTER — Encounter (INDEPENDENT_AMBULATORY_CARE_PROVIDER_SITE_OTHER): Payer: Medicare Other

## 2018-12-19 ENCOUNTER — Ambulatory Visit (INDEPENDENT_AMBULATORY_CARE_PROVIDER_SITE_OTHER): Payer: Medicare Other | Admitting: Vascular Surgery

## 2018-12-19 ENCOUNTER — Other Ambulatory Visit: Payer: Self-pay | Admitting: Internal Medicine

## 2018-12-26 ENCOUNTER — Other Ambulatory Visit: Payer: Self-pay

## 2018-12-26 ENCOUNTER — Encounter: Payer: Self-pay | Admitting: Internal Medicine

## 2018-12-26 ENCOUNTER — Ambulatory Visit (INDEPENDENT_AMBULATORY_CARE_PROVIDER_SITE_OTHER): Payer: Medicare Other | Admitting: Internal Medicine

## 2018-12-26 VITALS — BP 98/60 | HR 63 | Ht 61.0 in | Wt 109.4 lb

## 2018-12-26 DIAGNOSIS — I10 Essential (primary) hypertension: Secondary | ICD-10-CM | POA: Diagnosis not present

## 2018-12-26 DIAGNOSIS — M5136 Other intervertebral disc degeneration, lumbar region: Secondary | ICD-10-CM | POA: Diagnosis not present

## 2018-12-26 DIAGNOSIS — E1142 Type 2 diabetes mellitus with diabetic polyneuropathy: Secondary | ICD-10-CM

## 2018-12-26 DIAGNOSIS — I6523 Occlusion and stenosis of bilateral carotid arteries: Secondary | ICD-10-CM

## 2018-12-26 DIAGNOSIS — Z794 Long term (current) use of insulin: Secondary | ICD-10-CM

## 2018-12-26 DIAGNOSIS — I70212 Atherosclerosis of native arteries of extremities with intermittent claudication, left leg: Secondary | ICD-10-CM

## 2018-12-26 DIAGNOSIS — E441 Mild protein-calorie malnutrition: Secondary | ICD-10-CM

## 2018-12-26 DIAGNOSIS — E1165 Type 2 diabetes mellitus with hyperglycemia: Secondary | ICD-10-CM

## 2018-12-26 DIAGNOSIS — IMO0002 Reserved for concepts with insufficient information to code with codable children: Secondary | ICD-10-CM

## 2018-12-26 DIAGNOSIS — I739 Peripheral vascular disease, unspecified: Secondary | ICD-10-CM | POA: Diagnosis not present

## 2018-12-26 DIAGNOSIS — R634 Abnormal weight loss: Secondary | ICD-10-CM

## 2018-12-26 NOTE — Patient Instructions (Signed)
Weigh yourself every week and record  Use ensure or similar at least twice a day

## 2018-12-26 NOTE — Progress Notes (Signed)
Date:  12/26/2018   Name:  Jill Hudson   DOB:  29-May-1935   MRN:  [id number]   Chief Complaint: No chief complaint on file.  Back Pain  This is a recurrent problem. The pain is present in the lumbar spine. Pertinent negatives include no abdominal pain, chest pain, dysuria, fever or headaches. Treatments tried: seen by Ortho, had MRI.  New focal left foraminal/extraforaminal disc extrusion versus disc fragment at L4-L5 with severe left neuroforaminal stenosis affecting the exiting left L4 nerve root. Seeing a [facility]ner, broadcasting/film/video.  Dr. Deetta Perla in [place]. Also having knee pain - MRI ordered.  Now on gabapentin.  DM - followed by endocrinology. She has not been eating much lately due to loss of taste, has lost about 9 lbs so stopped taking her novolog.    PVD - recent revascularization of her left leg in December.  Since then nothing has any taste.  Her circulation is apparently improved but now dealing with back pain radiating to the left leg and knee.    WEight loss - eating very small amounts due to loss of taste.  Using Ensure sometimes but no more than one per day.  She does not have scales at home.   Review of Systems  Constitutional: Positive for appetite change and unexpected weight change. Negative for diaphoresis and fever.  Respiratory: Negative for cough, chest tightness, shortness of breath and wheezing.   Cardiovascular: Positive for leg swelling (mild edema in left leg). Negative for chest pain and palpitations.  Gastrointestinal: Negative for abdominal pain, constipation and diarrhea.  Genitourinary: Negative for dysuria.  Musculoskeletal: Positive for arthralgias, back pain and gait problem.  Skin: Negative for color change and rash.  Neurological: Negative for dizziness and headaches.  Psychiatric/Behavioral: Negative for sleep disturbance.    Patient Active Problem List   Diagnosis Date Noted  . Encounter for long-term (current) use of aspirin 10/31/2018  .  Encounter for long-term (current) use of antiplatelets/antithrombotics 10/31/2018  . Long term current use of oral hypoglycemic drug 10/31/2018  . Encounter for long-term (current) use of insulin ([place]) 10/31/2018  . Atherosclerosis of artery of extremity with rest pain ([place]) 10/12/2018  . GIB (gastrointestinal bleeding) 08/11/2018  . Leg pain 07/03/2017  . Carpal tunnel syndrome on both sides 05/05/2017  . Myalgia due to HMG CoA reductase inhibitor 05/05/2017  . CVA (cerebral vascular accident) ([place]) 02/15/2017  . Degenerative disc disease, lumbar 12/21/2016  . Atherosclerosis of native arteries of extremity with intermittent claudication ([place]) 12/20/2016  . Bilateral carotid artery stenosis 12/20/2016  . Hip bursitis 05/18/2016  . Elevated TSH 01/18/2016  . Chronic renal insufficiency, stage III (moderate) ([place]) 01/16/2016  . Senile ecchymosis 01/16/2016  . Uncontrolled type 2 diabetes mellitus with diabetic polyneuropathy, with long-term current use of insulin ([place]) 09/16/2015  . GERD (gastroesophageal reflux disease) 07/24/2015  . PAD (peripheral artery disease) ([place]) 05/17/2015  . Neoplasm of uncertain behavior of skin 05/17/2015  . Retinopathy, diabetic, proliferative ([place]) 04/11/2015  . Mixed hyperlipidemia 04/11/2015  . Essential (primary) hypertension 04/11/2015  . Generalized OA 04/11/2015  . Arteriosclerosis of coronary artery 05/29/2013  . Hypertensive heart disease without CHF 05/29/2013    Allergies  Allergen Reactions  . Saxagliptin Diarrhea  . Epinephrine Other (See Comments)    Patient does not remember what happens when she uses this  . Atorvastatin Other (See Comments)    Muscle aches  . Codeine Other (See Comments)    Upset stomach  . Ezetimibe  Other (See Comments)    Myalgias(ZETIA)  . Limonene Rash    Patient does not recall this reaction  . Nitrofurantoin Rash and Other (See Comments)    Pruitus  . Sulfa Antibiotics Rash and Other (See Comments)     Sore mouth     Past Surgical History:  Procedure Laterality Date  . CARDIAC CATHETERIZATION  1998   40% LM, 95% Ramus interm  . CATARACT EXTRACTION, BILATERAL    . COLONOSCOPY WITH PROPOFOL N/A 08/12/2018   Procedure: COLONOSCOPY WITH PROPOFOL;  Surgeon: Lucilla Lame, MD;  Location: [facility] ENDOSCOPY;  Service: Endoscopy;  Laterality: N/A;  . ENDARTERECTOMY FEMORAL Left 10/12/2018   Procedure: ENDARTERECTOMY FEMORAL;  Surgeon: Katha Cabal, MD;  Location: [facility] ORS;  Service: Vascular;  Laterality: Left;  angioplasty and left SFA stent placement  . EYE SURGERY Bilateral    cataract extractions  . LOWER EXTREMITY ANGIOGRAPHY Left 08/23/2017   Procedure: Lower Extremity Angiography;  Surgeon: Algernon Huxley, MD;  Location: [facility];  Service: Cardiovascular;  Laterality: Left;  . LOWER EXTREMITY ANGIOGRAPHY Left 07/26/2018   Procedure: LOWER EXTREMITY ANGIOGRAPHY;  Surgeon: Katha Cabal, MD;  Location: [facility];  Service: Cardiovascular;  Laterality: Left;  . LOWER EXTREMITY ANGIOGRAPHY Left 09/16/2018   Procedure: LOWER EXTREMITY ANGIOGRAPHY;  Surgeon: Katha Cabal, MD;  Location: [place];  Service: Cardiovascular;  Laterality: Left;  . PTCA  08/2013   Left common iliac  . PTCA  12/2012   left ext iliac  . TUBAL LIGATION      Social History   Tobacco Use  . Smoking status: Former Smoker    Packs/day: 2.00    Years: 37.00    Pack years: 74.00    Types: Cigarettes    Last attempt to quit: 1980    Years since quitting: 40.1  . Smokeless tobacco: Never Used  . Tobacco comment: smoking cessation materials not required  Substance Use Topics  . Alcohol use: No    Alcohol/week: 0.0 standard drinks  . Drug use: No     Medication list has been reviewed and updated.  Current Meds  Medication Sig  . acetaminophen (TYLENOL) 650 MG CR tablet Take 1,300 mg by mouth every 8 (eight) hours as needed for pain.  Marland Kitchen amLODipine (NORVASC) 2.5  MG tablet Take 2.5 mg by mouth daily.  Marland Kitchen aspirin 81 MG tablet Take 81 mg by mouth daily.   . B Complex Vitamins (VITAMIN-B COMPLEX) TABS Take 1 tablet by mouth daily.   . B-D ULTRAFINE III SHORT PEN 31G X 8 MM MISC USE AS DIRECTED  . baclofen (LIORESAL) 10 MG tablet TAKE 1 TABLET(10 MG) BY MOUTH THREE TIMES DAILY (Patient taking differently: Take 10 mg by mouth at bedtime. )  . Biotin 10 MG TABS Take 10 mg by mouth daily.   Marland Kitchen CALCIUM-MAGNESIUM-ZINC PO Take 1 tablet by mouth daily.   . chlorthalidone (HYGROTON) 25 MG tablet Take 25 mg by mouth daily.  . clopidogrel (PLAVIX) 75 MG tablet Take 1 tablet (75 mg total) by mouth daily.  . Cyanocobalamin (RA VITAMIN B-12 TR) 1000 MCG TBCR Take 1,000 mcg by mouth daily.   Marland Kitchen diltiazem (CARTIA XT) 180 MG 24 hr capsule Take 180 mg by mouth 2 (two) times daily.   . ferrous sulfate 325 (65 FE) MG tablet Take 325 mg by mouth daily with breakfast.  . gabapentin (NEURONTIN) 100 MG capsule Take 100 mg by mouth at bedtime.  Marland Kitchen  insulin aspart (NOVOLOG FLEXPEN) 100 UNIT/ML FlexPen Inject 4 Units into the skin daily as needed. At lunch  . Insulin Glargine (BASAGLAR KWIKPEN) 100 UNIT/ML SOPN Inject 12 Units into the skin daily.  Marland Kitchen losartan (COZAAR) 50 MG tablet Take 50 mg by mouth 2 (two) times daily.   . metFORMIN (GLUCOPHAGE-XR) 500 MG 24 hr tablet TAKE ONE TABLET BY MOUTH TWICE DAILY (Patient taking differently: Take 500 mg by mouth 2 (two) times daily. )  . Multiple Vitamins-Minerals (MULTIVITAMIN PO) Take 1 tablet by mouth daily.  . Multiple Vitamins-Minerals (PRESERVISION AREDS PO) Take 1 capsule by mouth 2 (two) times daily.   . nitroGLYCERIN (NITROSTAT) 0.4 MG SL tablet 1 tab under the tongue for CP, may repeat in 5 minutes up to 3 doses  . rosuvastatin (CRESTOR) 5 MG tablet Take 5 mg by mouth every other day.   . traMADol (ULTRAM) 50 MG tablet Take 1 tablet (50 mg total) by mouth every 8 (eight) hours as needed.    PHQ 2/9 Scores 12/26/2018 06/15/2018  03/07/2018 06/02/2017  PHQ - 2 Score 0 0 0 1  PHQ- 9 Score - 0 - -   Wt Readings from Last 3 Encounters:  12/26/18 109 lb 6.4 oz (49.6 kg)  11/25/18 117 lb (53.1 kg)  11/21/18 117 lb (53.1 kg)    Physical Exam Vitals signs and nursing note reviewed.  Constitutional:      General: She is not in acute distress.    Appearance: She is well-developed.  HENT:     Head: Normocephalic and atraumatic.  Neck:     Musculoskeletal: Normal range of motion and neck supple.  Cardiovascular:     Rate and Rhythm: Normal rate and regular rhythm.  Pulmonary:     Effort: Pulmonary effort is normal. No respiratory distress.     Breath sounds: Normal breath sounds. No wheezing.  Abdominal:     General: Abdomen is flat.     Palpations: Abdomen is soft.  Musculoskeletal: Normal range of motion.     Right lower leg: No edema.     Left lower leg: Edema present.  Lymphadenopathy:     Cervical: No cervical adenopathy.  Skin:    General: Skin is warm and dry.     Findings: No rash.  Neurological:     Mental Status: She is alert and oriented to person, place, and time.  Psychiatric:        Behavior: Behavior normal.        Thought Content: Thought content normal.     BP 98/60   Pulse 63   Ht 5\' 1"  (1.549 m)   Wt 109 lb 6.4 oz (49.6 kg)   SpO2 100%   BMI 20.67 kg/m   Assessment and Plan: 1. Malnutrition of mild degree (HCC) Recommend weekly weights Begin Ensure or similar twice a day  2. Essential (primary) hypertension controlled - Comprehensive metabolic panel  3. Weight loss, unintentional Check labs - TSH  4. Degenerative disc disease, lumbar Seeing Neurosurgery tomorrow for recommendations  5. PAD (peripheral artery disease) ([place]) S/p procedure in December with good results but mild persistent edema Recommend elevation several hours twice a day  6. Atherosclerosis of native artery of left lower extremity with intermittent claudication (HCC) - Lipid panel  7. Uncontrolled  type 2 diabetes mellitus with diabetic polyneuropathy, with long-term current use of insulin [facility]) Continue Basaglar May need to resume Novolog once taking supplements regularly   Partially dictated using Editor, commissioning. Any errors are  unintentional.  [name], MD [facility]  12/26/2018

## 2018-12-27 DIAGNOSIS — H353211 Exudative age-related macular degeneration, right eye, with active choroidal neovascularization: Secondary | ICD-10-CM | POA: Diagnosis not present

## 2018-12-27 DIAGNOSIS — M5416 Radiculopathy, lumbar region: Secondary | ICD-10-CM | POA: Diagnosis not present

## 2018-12-27 LAB — COMPREHENSIVE METABOLIC PANEL
ALK PHOS: 69 IU/L (ref 39–117)
ALT: 16 IU/L (ref 0–32)
AST: 14 IU/L (ref 0–40)
Albumin/Globulin Ratio: 2.2 (ref 1.2–2.2)
Albumin: 4.3 g/dL (ref 3.6–4.6)
BUN / CREAT RATIO: 19 (ref 12–28)
BUN: 22 mg/dL (ref 8–27)
Bilirubin Total: <0.2 mg/dL (ref 0.0–1.2)
CO2: 19 mmol/L — ABNORMAL LOW (ref 20–29)
Calcium: 9.7 mg/dL (ref 8.7–10.3)
Chloride: 98 mmol/L (ref 96–106)
Creatinine, Ser: 1.17 mg/dL — ABNORMAL HIGH (ref 0.57–1.00)
GFR calc Af Amer: 50 mL/min/{1.73_m2} — ABNORMAL LOW (ref >59)
GFR calc non Af Amer: 43 mL/min/{1.73_m2} — ABNORMAL LOW (ref >59)
Globulin, Total: 2 g/dL (ref 1.5–4.5)
Glucose: 141 mg/dL — ABNORMAL HIGH (ref 65–99)
Potassium: 4.4 mmol/L (ref 3.5–5.2)
Sodium: 134 mmol/L (ref 134–144)
Total Protein: 6.3 g/dL (ref 6.0–8.5)

## 2018-12-27 LAB — LIPID PANEL
Chol/HDL Ratio: 3 ratio (ref 0.0–4.4)
Cholesterol, Total: 139 mg/dL (ref 100–199)
HDL: 47 mg/dL (ref >39)
LDL Calculated: 57 mg/dL (ref 0–99)
TRIGLYCERIDES: 176 mg/dL — AB (ref 0–149)
VLDL Cholesterol Cal: 35 mg/dL (ref 5–40)

## 2018-12-27 LAB — TSH: TSH: 3.75 u[IU]/mL (ref 0.450–4.500)

## 2019-01-09 DIAGNOSIS — M5416 Radiculopathy, lumbar region: Secondary | ICD-10-CM | POA: Diagnosis not present

## 2019-01-09 DIAGNOSIS — M6281 Muscle weakness (generalized): Secondary | ICD-10-CM | POA: Diagnosis not present

## 2019-01-12 ENCOUNTER — Ambulatory Visit
Admission: RE | Admit: 2019-01-12 | Discharge: 2019-01-12 | Disposition: A | Discharge status: Home / Self Care | Payer: Medicare Other | Source: Ambulatory Visit | Attending: Orthopedic Surgery | Admitting: Orthopedic Surgery

## 2019-01-12 ENCOUNTER — Other Ambulatory Visit: Payer: Self-pay

## 2019-01-12 DIAGNOSIS — M25562 Pain in left knee: Secondary | ICD-10-CM | POA: Diagnosis not present

## 2019-01-12 DIAGNOSIS — M2392 Unspecified internal derangement of left knee: Secondary | ICD-10-CM | POA: Diagnosis not present

## 2019-01-12 DIAGNOSIS — M25362 Other instability, left knee: Secondary | ICD-10-CM | POA: Insufficient documentation

## 2019-01-13 DIAGNOSIS — M5126 Other intervertebral disc displacement, lumbar region: Secondary | ICD-10-CM | POA: Diagnosis not present

## 2019-01-13 DIAGNOSIS — M5416 Radiculopathy, lumbar region: Secondary | ICD-10-CM | POA: Diagnosis not present

## 2019-01-18 ENCOUNTER — Other Ambulatory Visit: Payer: Self-pay | Admitting: Internal Medicine

## 2019-01-18 DIAGNOSIS — E1142 Type 2 diabetes mellitus with diabetic polyneuropathy: Secondary | ICD-10-CM

## 2019-01-18 DIAGNOSIS — Z794 Long term (current) use of insulin: Principal | ICD-10-CM

## 2019-01-18 DIAGNOSIS — E1165 Type 2 diabetes mellitus with hyperglycemia: Secondary | ICD-10-CM

## 2019-01-18 DIAGNOSIS — IMO0002 Reserved for concepts with insufficient information to code with codable children: Secondary | ICD-10-CM

## 2019-01-18 MED ORDER — INSULIN ASPART 100 UNIT/ML FLEXPEN: 3.0000 [IU] | PEN_INJECTOR | Freq: Three times a day (TID) | SUBCUTANEOUS | 3 refills | Status: DC

## 2019-01-23 ENCOUNTER — Emergency Department
Admission: EM | Admit: 2019-01-23 | Discharge: 2019-01-23 | Disposition: A | Discharge status: Home / Self Care | Payer: Medicare Other | Attending: Emergency Medicine | Admitting: Emergency Medicine

## 2019-01-23 ENCOUNTER — Other Ambulatory Visit: Payer: Self-pay

## 2019-01-23 ENCOUNTER — Encounter: Payer: Self-pay | Admitting: Emergency Medicine

## 2019-01-23 DIAGNOSIS — M5430 Sciatica, unspecified side: Secondary | ICD-10-CM | POA: Insufficient documentation

## 2019-01-23 DIAGNOSIS — Z87891 Personal history of nicotine dependence: Secondary | ICD-10-CM | POA: Insufficient documentation

## 2019-01-23 DIAGNOSIS — M5116 Intervertebral disc disorders with radiculopathy, lumbar region: Secondary | ICD-10-CM | POA: Diagnosis not present

## 2019-01-23 DIAGNOSIS — Z7982 Long term (current) use of aspirin: Secondary | ICD-10-CM | POA: Diagnosis not present

## 2019-01-23 DIAGNOSIS — G8929 Other chronic pain: Secondary | ICD-10-CM | POA: Insufficient documentation

## 2019-01-23 DIAGNOSIS — M545 Low back pain: Secondary | ICD-10-CM | POA: Insufficient documentation

## 2019-01-23 DIAGNOSIS — I10 Essential (primary) hypertension: Secondary | ICD-10-CM | POA: Insufficient documentation

## 2019-01-23 DIAGNOSIS — E119 Type 2 diabetes mellitus without complications: Secondary | ICD-10-CM | POA: Diagnosis not present

## 2019-01-23 DIAGNOSIS — Z8673 Personal history of transient ischemic attack (TIA), and cerebral infarction without residual deficits: Secondary | ICD-10-CM | POA: Diagnosis not present

## 2019-01-23 DIAGNOSIS — Z79899 Other long term (current) drug therapy: Secondary | ICD-10-CM | POA: Insufficient documentation

## 2019-01-23 DIAGNOSIS — I959 Hypotension, unspecified: Secondary | ICD-10-CM | POA: Diagnosis not present

## 2019-01-23 DIAGNOSIS — Z9889 Other specified postprocedural states: Secondary | ICD-10-CM | POA: Diagnosis not present

## 2019-01-23 DIAGNOSIS — M5442 Lumbago with sciatica, left side: Secondary | ICD-10-CM

## 2019-01-23 DIAGNOSIS — R52 Pain, unspecified: Secondary | ICD-10-CM | POA: Diagnosis not present

## 2019-01-23 MED ORDER — PREDNISONE 50 MG PO TABS: 50.0000 mg | ORAL_TABLET | Freq: Every day | ORAL | 0 refills | Status: DC

## 2019-01-23 MED ORDER — ONDANSETRON 8 MG PO TBDP
8.0000 mg | ORAL_TABLET | Freq: Once | ORAL | Status: AC
  Administered 2019-01-23: 8 mg via ORAL
  Filled 2019-01-23: qty 1

## 2019-01-23 MED ORDER — DEXAMETHASONE SODIUM PHOSPHATE 10 MG/ML IJ SOLN
10.0000 mg | Freq: Once | INTRAMUSCULAR | Status: AC
  Administered 2019-01-23: 10 mg via INTRAMUSCULAR
  Filled 2019-01-23: qty 1

## 2019-01-23 MED ORDER — OXYCODONE-ACETAMINOPHEN 5-325 MG PO TABS: 1.0000 | ORAL_TABLET | Freq: Four times a day (QID) | ORAL | 0 refills | Status: DC | PRN

## 2019-01-23 MED ORDER — MORPHINE SULFATE (PF) 4 MG/ML IV SOLN
4.0000 mg | Freq: Once | INTRAVENOUS | Status: AC
  Administered 2019-01-23: 4 mg via INTRAMUSCULAR
  Filled 2019-01-23: qty 1

## 2019-01-23 NOTE — ED Notes (Signed)
States she took a gabapentin this am and the pain eased off some  Then returned later  States she took a oxycodone w/o relief this afternoon

## 2019-01-23 NOTE — ED Notes (Signed)
Pt will wait for her transport on the room for her protection due to her age.

## 2019-01-23 NOTE — ED Provider Notes (Signed)
[facility] Emergency Department Provider Note  ____________________________________________  Time seen: Approximately 6:19 PM  I have reviewed the triage vital signs and the nursing notes.   HISTORY  Chief Complaint Back Pain    HPI Jill Hudson is a 84 y.o. female who presents emergency department complaining of increased lower back pain.  Patient has had a history of known ruptured disc in the L4-L5 region.  Patient has both central cord as well as nerve root impingement.  Patient has seen both orthopedics, neurosurgery and physiatry for this lower back pain.  Patient states that 4 days ago she received an epidural injection into her lumbar spine.  Initially this injection improved her symptoms but she has had a return of pain that was worse today.  Patient had pain radiating from her back into her left leg.  This is her normal pain distribution.  No bowel or bladder dysfunction, saddle anesthesia or paresthesias.  Patient states that the pain was 10 out of 10 so she called her orthopedic surgeon who advised her to come to the emergency department for evaluation.  Patient states that she has a prescription for Percocet that she received 5 months ago.  Patient received 30 pills and only takes 1 as needed.  She still has 4 to 5 pills left but states that she took 1 prior to arrival.  She states that the pain has drastically improved with the Percocet.  She denies any recent trauma.  No GI or urinary symptoms.  No other complaints at this time.           Past Medical History:  Diagnosis Date  . Allergies   . Anxiety   . Arthritis    spine and shoulder  . Atherosclerosis of artery of extremity with rest pain ([place]) 10/12/2018  . Cancer ([place])    skin  . Diabetes mellitus without complication ([place])   . Heart murmur   . Hyperlipidemia   . Hypertension   . Macula lutea degeneration   . Mitral and aortic valve disease   . Myocardial infarction [facility])    may  have had a "light" heart attack  . Occasional tremors   . PAD (peripheral artery disease) ([place])   . Shingles    patient unaware but daughter confirms. it was a long time ago  . Stroke [facility]) 01/2017   may have had a slight stroke  . TIA (transient ischemic attack) 01/2017  . UTI (urinary tract infection)   . Vascular disease, peripheral [facility])     Patient Active Problem List   Diagnosis Date Noted  . Encounter for long-term (current) use of aspirin 10/31/2018  . Encounter for long-term (current) use of antiplatelets/antithrombotics 10/31/2018  . Long term current use of oral hypoglycemic drug 10/31/2018  . Encounter for long-term (current) use of insulin ([place]) 10/31/2018  . GIB (gastrointestinal bleeding) 08/11/2018  . Leg pain 07/03/2017  . Carpal tunnel syndrome on both sides 05/05/2017  . Myalgia due to HMG CoA reductase inhibitor 05/05/2017  . CVA (cerebral vascular accident) ([place]) 02/15/2017  . Degenerative disc disease, lumbar 12/21/2016  . Atherosclerosis of native arteries of extremity with intermittent claudication ([place]) 12/20/2016  . Bilateral carotid artery stenosis 12/20/2016  . Hip bursitis 05/18/2016  . Elevated TSH 01/18/2016  . Chronic renal insufficiency, stage III (moderate) ([place]) 01/16/2016  . Senile ecchymosis 01/16/2016  . Uncontrolled type 2 diabetes mellitus with diabetic polyneuropathy, with long-term current use of insulin ([place]) 09/16/2015  . GERD (gastroesophageal  reflux disease) 07/24/2015  . PAD (peripheral artery disease) ([place]) 05/17/2015  . Neoplasm of uncertain behavior of skin 05/17/2015  . Retinopathy, diabetic, proliferative ([place]) 04/11/2015  . Mixed hyperlipidemia 04/11/2015  . Essential (primary) hypertension 04/11/2015  . Generalized OA 04/11/2015  . Arteriosclerosis of coronary artery 05/29/2013  . Hypertensive heart disease without CHF 05/29/2013    Past Surgical History:  Procedure Laterality Date  . CARDIAC CATHETERIZATION  1998    40% LM, 95% Ramus interm  . CATARACT EXTRACTION, BILATERAL    . COLONOSCOPY WITH PROPOFOL N/A 08/12/2018   Procedure: COLONOSCOPY WITH PROPOFOL;  Surgeon: Lucilla Lame, MD;  Location: [facility] ENDOSCOPY;  Service: Endoscopy;  Laterality: N/A;  . ENDARTERECTOMY FEMORAL Left 10/12/2018   Procedure: ENDARTERECTOMY FEMORAL;  Surgeon: Katha Cabal, MD;  Location: [facility] ORS;  Service: Vascular;  Laterality: Left;  angioplasty and left SFA stent placement  . EYE SURGERY Bilateral    cataract extractions  . LOWER EXTREMITY ANGIOGRAPHY Left 08/23/2017   Procedure: Lower Extremity Angiography;  Surgeon: Algernon Huxley, MD;  Location: [place] CV LAB;  Service: Cardiovascular;  Laterality: Left;  . LOWER EXTREMITY ANGIOGRAPHY Left 07/26/2018   Procedure: LOWER EXTREMITY ANGIOGRAPHY;  Surgeon: Katha Cabal, MD;  Location: [place] CV LAB;  Service: Cardiovascular;  Laterality: Left;  . LOWER EXTREMITY ANGIOGRAPHY Left 09/16/2018   Procedure: LOWER EXTREMITY ANGIOGRAPHY;  Surgeon: Katha Cabal, MD;  Location: [place] CV LAB;  Service: Cardiovascular;  Laterality: Left;  . PTCA  08/2013   Left common iliac  . PTCA  12/2012   left ext iliac  . TUBAL LIGATION      Prior to Admission medications   Medication Sig Start Date End Date Taking? Authorizing Provider  acetaminophen (TYLENOL) 650 MG CR tablet Take 1,300 mg by mouth every 8 (eight) hours as needed for pain.    [provider]  amLODipine (NORVASC) 2.5 MG tablet Take 2.5 mg by mouth daily. 05/20/18   [provider]  aspirin 81 MG tablet Take 81 mg by mouth daily.     [provider]  B Complex Vitamins (VITAMIN-B COMPLEX) TABS Take 1 tablet by mouth daily.     [provider]  B-D ULTRAFINE III SHORT PEN 31G X 8 MM MISC USE AS DIRECTED 12/19/18   [name], MD  baclofen (LIORESAL) 10 MG tablet TAKE 1 TABLET(10 MG) BY MOUTH THREE TIMES DAILY Patient taking differently: Take 10 mg  by mouth at bedtime.  02/09/18   [name], MD  Biotin 10 MG TABS Take 10 mg by mouth daily.     [provider]  CALCIUM-MAGNESIUM-ZINC PO Take 1 tablet by mouth daily.     [provider]  chlorthalidone (HYGROTON) 25 MG tablet Take 25 mg by mouth daily.    [provider]  clopidogrel (PLAVIX) 75 MG tablet Take 1 tablet (75 mg total) by mouth daily. 07/27/18   Schnier, Dolores Lory, MD  Cyanocobalamin (RA VITAMIN B-12 TR) 1000 MCG TBCR Take 1,000 mcg by mouth daily.     [provider]  diltiazem (CARTIA XT) 180 MG 24 hr capsule Take 180 mg by mouth 2 (two) times daily.     [provider]  ferrous sulfate 325 (65 FE) MG tablet Take 325 mg by mouth daily with breakfast.    [provider]  gabapentin (NEURONTIN) 100 MG capsule Take 100 mg by mouth at bedtime.    [provider]  insulin aspart (  NOVOLOG FLEXPEN) 100 UNIT/ML FlexPen Inject 3 Units into the skin 3 (three) times daily with meals. At lunch 01/18/19   [name], MD  Insulin Glargine [facility]) 100 UNIT/ML SOPN Inject 12 Units into the skin daily.    [provider]  losartan (COZAAR) 50 MG tablet Take 50 mg by mouth 2 (two) times daily.  06/20/18   [provider]  metFORMIN (GLUCOPHAGE-XR) 500 MG 24 hr tablet TAKE ONE TABLET BY MOUTH TWICE DAILY Patient taking differently: Take 500 mg by mouth 2 (two) times daily.  01/26/18   [name], MD  Multiple Vitamins-Minerals (MULTIVITAMIN PO) Take 1 tablet by mouth daily.    [provider]  Multiple Vitamins-Minerals (PRESERVISION AREDS PO) Take 1 capsule by mouth 2 (two) times daily.     [provider]  nitroGLYCERIN (NITROSTAT) 0.4 MG SL tablet 1 tab under the tongue for CP, may repeat in 5 minutes up to 3 doses 09/07/18   [name], MD  oxyCODONE-acetaminophen (PERCOCET/ROXICET) 5-325 MG tablet Take 1 tablet by mouth every 6 (six) hours as needed for severe  pain. 01/23/19   Jakorey Mcconathy, Charline Bills, PA-C  predniSONE (DELTASONE) 50 MG tablet Take 1 tablet (50 mg total) by mouth daily with breakfast. 01/23/19   Shaden Higley, Charline Bills, PA-C  rosuvastatin (CRESTOR) 5 MG tablet Take 5 mg by mouth every other day.  03/02/18   [provider]  traMADol (ULTRAM) 50 MG tablet Take 1 tablet (50 mg total) by mouth every 8 (eight) hours as needed. 11/25/18   Coral Spikes, DO    Allergies Saxagliptin; Epinephrine; Atorvastatin; Codeine; Ezetimibe; Limonene; Nitrofurantoin; and Sulfa antibiotics  Family History  Problem Relation Age of Onset  . Dementia Mother   . Diabetes Father     Social History Social History   Tobacco Use  . Smoking status: Former Smoker    Packs/day: 2.00    Years: 37.00    Pack years: 74.00    Types: Cigarettes    Last attempt to quit: 1980    Years since quitting: 40.2  . Smokeless tobacco: Never Used  . Tobacco comment: smoking cessation materials not required  Substance Use Topics  . Alcohol use: No    Alcohol/week: 0.0 standard drinks  . Drug use: No     Review of Systems  Constitutional: No fever/chills Eyes: No visual changes. No discharge ENT: No upper respiratory complaints. Cardiovascular: no chest pain. Respiratory: no cough. No SOB. Gastrointestinal: No abdominal pain.  No nausea, no vomiting.  No diarrhea.  No constipation. Genitourinary: Negative for dysuria. No hematuria Musculoskeletal: Positive for increasing chronic back pain. Skin: Negative for rash, abrasions, lacerations, ecchymosis. Neurological: Negative for headaches, focal weakness or numbness. 10-point ROS otherwise negative.  ____________________________________________   PHYSICAL EXAM:  VITAL SIGNS: ED Triage Vitals [01/23/19 1812]  Enc Vitals Group     BP      Pulse      Resp      Temp      Temp src      SpO2      Weight 104 lb (47.2 kg)     Height 5\' 1"  (1.549 m)     Head Circumference      Peak Flow      Pain  Score 7     Pain Loc      Pain Edu?      Excl. in [place]?      Constitutional: Alert and oriented. Well appearing  and in no acute distress. Eyes: Conjunctivae are normal. PERRL. EOMI. Head: Atraumatic. Neck: No stridor.    Cardiovascular: Normal rate, regular rhythm. Normal S1 and S2.  Good peripheral circulation. Respiratory: Normal respiratory effort without tachypnea or retractions. Lungs CTAB. Good air entry to the bases with no decreased or absent breath sounds. Gastrointestinal: Bowel sounds 4 quadrants. Soft and nontender to palpation. No guarding or rigidity. No palpable masses. No distention. No CVA tenderness. Musculoskeletal: Full range of motion to all extremities. No gross deformities appreciated.  Visualization of the lumbar spine reveals no gross abnormality.  Patient is nontender to palpation midline or over paraspinal muscle groups.  Patient is very tender to palpation over the left-sided sciatic notch however.  Dorsalis pedis pulse intact bilateral lower extremity.  Sensation intact in all dermatomal distributions bilaterally. Neurologic:  Normal speech and language. No gross focal neurologic deficits are appreciated.  Skin:  Skin is warm, dry and intact. No rash noted. Psychiatric: Mood and affect are normal. Speech and behavior are normal. Patient exhibits appropriate insight and judgement.   ____________________________________________   LABS (all labs ordered are listed, but only abnormal results are displayed)  Labs Reviewed - No data to display ____________________________________________  EKG   ____________________________________________  RADIOLOGY   Lumbar spine MRI from 12/12/2018 reviewed at this time. IMPRESSION: 1. New focal left foraminal/extraforaminal disc extrusion versus disc fragment at L4-L5 with severe left neuroforaminal stenosis affecting the exiting left L4 nerve root. Unchanged mild spinal canal and right neuroforaminal stenosis at this  level. 2. Progressive degenerative disc disease from T12-L1 through L2-L3 with new mild spinal canal and lateral recess stenosis at L2-L3. 3. Unchanged mild neuroforaminal stenosis on the left at L3-L4 and bilaterally at L5-S1.  No results found.  ____________________________________________    PROCEDURES  Procedure(s) performed:    Procedures    Medications  dexamethasone (DECADRON) injection 10 mg (has no administration in time range)  morphine 4 MG/ML injection 4 mg (has no administration in time range)  ondansetron (ZOFRAN-ODT) disintegrating tablet 8 mg (has no administration in time range)     ____________________________________________   INITIAL IMPRESSION / ASSESSMENT AND PLAN / ED COURSE  Pertinent labs & imaging results that were available during my care of the patient were reviewed by me and considered in my medical decision making (see chart for details).  Review of the [place] CSRS was performed in accordance of the [place] prior to dispensing any controlled drugs.           Patient's diagnosis is consistent with lumbago.  Patient has a history of herniated disc at the L4-L5 distribution.  Patient has had ongoing symptoms.  Patient recently received epidural injection which improved symptoms somewhat but symptoms have worsened at this time.  This is likely phenomenon of lidocaine/bupivacaine wearing off prior to onset of steroid.  At this time, patient is sustained no new trauma.  No concerning neuro deficits at this time.  Exam is overall reassuring.  At this time I did review the patient's MRI from February of this year.  No indication for repeat imaging at this time.  Patient symptoms had improved drastically prior to arrival after she took a Percocet which she was prescribed.  At this time, patient will be given injection of pain medication, steroid in the emergency department for symptom relief.  I discussed that this was likely the depth between her 2 medications  injected epidurally.  At this time patient will be prescribed short course of steroid as  well as a prescription for Percocet.  Patient did receive a prescription for Percocet approximately 4 months ago for 30 pills which she has stretched over 4 months.  As such she will be prescribed Percocet for her pain out of the emergency department.  On review of neurosurgery notes, it appears that neurosurgery does not believe that conservative measures will alleviate patient's symptoms.  Given this finding with ongoing symptoms I did discuss following up with neurosurgery for further discussion for surgical management of her complaint.  Patient verbalizes that she may follow-up with orthopedics, neurosurgery or physiatry..  Patient is given ED precautions to return to the ED for any worsening or new symptoms.     ____________________________________________  FINAL CLINICAL IMPRESSION(S) / ED DIAGNOSES  Final diagnoses:  Chronic midline low back pain with left-sided sciatica      NEW MEDICATIONS STARTED DURING THIS VISIT:  ED Discharge Orders         Ordered    predniSONE (DELTASONE) 50 MG tablet  Daily with breakfast     01/23/19 1910    oxyCODONE-acetaminophen (PERCOCET/ROXICET) 5-325 MG tablet  Every 6 hours PRN     01/23/19 1910              This chart was dictated using voice recognition software/Dragon. Despite best efforts to proofread, errors can occur which can change the meaning. Any change was purely unintentional.    Darletta Moll, PA-C 01/23/19 Synetta Fail, MD 01/23/19 2123

## 2019-01-23 NOTE — ED Triage Notes (Signed)
Presents vis EMS   States she had a fall in Jan and developed a back injury  States she also was seen and had an injection at [facility] on Friday  States pain returned yesterday   Pain is moving into left leg

## 2019-01-25 ENCOUNTER — Telehealth: Payer: Self-pay

## 2019-01-25 NOTE — Telephone Encounter (Signed)
Daughter called stating she stopped EMS from taking patient to [facility] with nausea. Patient was requesting to go and EMS advised vitals stable and no confusion or urinary symptoms, no fever, and patient admitted to taking her meds this morning and only eating banana. She has not felt this sudden nausea in past and has not had any injury or illness recently. EMS advised daughter that she can call PCP for Zofran and that there really is no need for ED. Advised OTC nausea med and if no better to call in AM and we can do phone visit. PCP agrees.

## 2019-01-31 DIAGNOSIS — M5416 Radiculopathy, lumbar region: Secondary | ICD-10-CM | POA: Diagnosis not present

## 2019-02-08 DIAGNOSIS — I2510 Atherosclerotic heart disease of native coronary artery without angina pectoris: Secondary | ICD-10-CM | POA: Diagnosis not present

## 2019-02-08 DIAGNOSIS — E785 Hyperlipidemia, unspecified: Secondary | ICD-10-CM | POA: Diagnosis not present

## 2019-02-08 DIAGNOSIS — I739 Peripheral vascular disease, unspecified: Secondary | ICD-10-CM | POA: Diagnosis not present

## 2019-02-08 DIAGNOSIS — M5136 Other intervertebral disc degeneration, lumbar region: Secondary | ICD-10-CM | POA: Diagnosis not present

## 2019-02-08 DIAGNOSIS — M5417 Radiculopathy, lumbosacral region: Secondary | ICD-10-CM | POA: Diagnosis not present

## 2019-02-08 DIAGNOSIS — I119 Hypertensive heart disease without heart failure: Secondary | ICD-10-CM | POA: Diagnosis not present

## 2019-02-09 DIAGNOSIS — E1142 Type 2 diabetes mellitus with diabetic polyneuropathy: Secondary | ICD-10-CM | POA: Diagnosis not present

## 2019-02-09 DIAGNOSIS — Z794 Long term (current) use of insulin: Secondary | ICD-10-CM | POA: Diagnosis not present

## 2019-02-10 DIAGNOSIS — E1159 Type 2 diabetes mellitus with other circulatory complications: Secondary | ICD-10-CM | POA: Diagnosis not present

## 2019-02-10 DIAGNOSIS — E1169 Type 2 diabetes mellitus with other specified complication: Secondary | ICD-10-CM | POA: Diagnosis not present

## 2019-02-10 DIAGNOSIS — Z794 Long term (current) use of insulin: Secondary | ICD-10-CM | POA: Diagnosis not present

## 2019-02-10 DIAGNOSIS — E1142 Type 2 diabetes mellitus with diabetic polyneuropathy: Secondary | ICD-10-CM | POA: Diagnosis not present

## 2019-02-10 DIAGNOSIS — E785 Hyperlipidemia, unspecified: Secondary | ICD-10-CM | POA: Diagnosis not present

## 2019-02-10 DIAGNOSIS — I10 Essential (primary) hypertension: Secondary | ICD-10-CM | POA: Diagnosis not present

## 2019-02-10 LAB — HEMOGLOBIN A1C: Hemoglobin A1C: 8.3

## 2019-02-14 DIAGNOSIS — M5126 Other intervertebral disc displacement, lumbar region: Secondary | ICD-10-CM | POA: Diagnosis not present

## 2019-02-14 DIAGNOSIS — M5416 Radiculopathy, lumbar region: Secondary | ICD-10-CM | POA: Diagnosis not present

## 2019-02-27 DIAGNOSIS — Z7982 Long term (current) use of aspirin: Secondary | ICD-10-CM | POA: Diagnosis not present

## 2019-02-27 DIAGNOSIS — E1142 Type 2 diabetes mellitus with diabetic polyneuropathy: Secondary | ICD-10-CM | POA: Diagnosis not present

## 2019-02-27 DIAGNOSIS — E113593 Type 2 diabetes mellitus with proliferative diabetic retinopathy without macular edema, bilateral: Secondary | ICD-10-CM | POA: Diagnosis not present

## 2019-02-27 DIAGNOSIS — Z7902 Long term (current) use of antithrombotics/antiplatelets: Secondary | ICD-10-CM | POA: Diagnosis not present

## 2019-02-27 DIAGNOSIS — Z87891 Personal history of nicotine dependence: Secondary | ICD-10-CM | POA: Diagnosis not present

## 2019-02-27 DIAGNOSIS — Z794 Long term (current) use of insulin: Secondary | ICD-10-CM | POA: Diagnosis not present

## 2019-02-27 DIAGNOSIS — M5136 Other intervertebral disc degeneration, lumbar region: Secondary | ICD-10-CM | POA: Diagnosis not present

## 2019-02-27 DIAGNOSIS — M5442 Lumbago with sciatica, left side: Secondary | ICD-10-CM | POA: Diagnosis not present

## 2019-02-27 DIAGNOSIS — I70212 Atherosclerosis of native arteries of extremities with intermittent claudication, left leg: Secondary | ICD-10-CM | POA: Diagnosis not present

## 2019-02-27 DIAGNOSIS — Z8673 Personal history of transient ischemic attack (TIA), and cerebral infarction without residual deficits: Secondary | ICD-10-CM | POA: Diagnosis not present

## 2019-02-27 DIAGNOSIS — E1151 Type 2 diabetes mellitus with diabetic peripheral angiopathy without gangrene: Secondary | ICD-10-CM | POA: Diagnosis not present

## 2019-02-27 DIAGNOSIS — M48061 Spinal stenosis, lumbar region without neurogenic claudication: Secondary | ICD-10-CM | POA: Diagnosis not present

## 2019-02-27 DIAGNOSIS — E1165 Type 2 diabetes mellitus with hyperglycemia: Secondary | ICD-10-CM | POA: Diagnosis not present

## 2019-02-28 DIAGNOSIS — M5416 Radiculopathy, lumbar region: Secondary | ICD-10-CM | POA: Diagnosis not present

## 2019-03-02 DIAGNOSIS — M5136 Other intervertebral disc degeneration, lumbar region: Secondary | ICD-10-CM | POA: Diagnosis not present

## 2019-03-02 DIAGNOSIS — M5442 Lumbago with sciatica, left side: Secondary | ICD-10-CM | POA: Diagnosis not present

## 2019-03-02 DIAGNOSIS — E1142 Type 2 diabetes mellitus with diabetic polyneuropathy: Secondary | ICD-10-CM | POA: Diagnosis not present

## 2019-03-02 DIAGNOSIS — E1151 Type 2 diabetes mellitus with diabetic peripheral angiopathy without gangrene: Secondary | ICD-10-CM | POA: Diagnosis not present

## 2019-03-02 DIAGNOSIS — I70212 Atherosclerosis of native arteries of extremities with intermittent claudication, left leg: Secondary | ICD-10-CM | POA: Diagnosis not present

## 2019-03-02 DIAGNOSIS — M48061 Spinal stenosis, lumbar region without neurogenic claudication: Secondary | ICD-10-CM | POA: Diagnosis not present

## 2019-03-03 ENCOUNTER — Other Ambulatory Visit: Payer: Self-pay | Admitting: Internal Medicine

## 2019-03-06 DIAGNOSIS — M5136 Other intervertebral disc degeneration, lumbar region: Secondary | ICD-10-CM | POA: Diagnosis not present

## 2019-03-06 DIAGNOSIS — I70212 Atherosclerosis of native arteries of extremities with intermittent claudication, left leg: Secondary | ICD-10-CM | POA: Diagnosis not present

## 2019-03-06 DIAGNOSIS — E1142 Type 2 diabetes mellitus with diabetic polyneuropathy: Secondary | ICD-10-CM | POA: Diagnosis not present

## 2019-03-06 DIAGNOSIS — M48061 Spinal stenosis, lumbar region without neurogenic claudication: Secondary | ICD-10-CM | POA: Diagnosis not present

## 2019-03-06 DIAGNOSIS — M5442 Lumbago with sciatica, left side: Secondary | ICD-10-CM | POA: Diagnosis not present

## 2019-03-06 DIAGNOSIS — E1151 Type 2 diabetes mellitus with diabetic peripheral angiopathy without gangrene: Secondary | ICD-10-CM | POA: Diagnosis not present

## 2019-03-08 DIAGNOSIS — M5136 Other intervertebral disc degeneration, lumbar region: Secondary | ICD-10-CM | POA: Diagnosis not present

## 2019-03-08 DIAGNOSIS — E1151 Type 2 diabetes mellitus with diabetic peripheral angiopathy without gangrene: Secondary | ICD-10-CM | POA: Diagnosis not present

## 2019-03-08 DIAGNOSIS — M5442 Lumbago with sciatica, left side: Secondary | ICD-10-CM | POA: Diagnosis not present

## 2019-03-08 DIAGNOSIS — M48061 Spinal stenosis, lumbar region without neurogenic claudication: Secondary | ICD-10-CM | POA: Diagnosis not present

## 2019-03-08 DIAGNOSIS — I70212 Atherosclerosis of native arteries of extremities with intermittent claudication, left leg: Secondary | ICD-10-CM | POA: Diagnosis not present

## 2019-03-08 DIAGNOSIS — E1142 Type 2 diabetes mellitus with diabetic polyneuropathy: Secondary | ICD-10-CM | POA: Diagnosis not present

## 2019-03-10 ENCOUNTER — Other Ambulatory Visit (INDEPENDENT_AMBULATORY_CARE_PROVIDER_SITE_OTHER): Payer: Medicare Other | Admitting: Internal Medicine

## 2019-03-10 DIAGNOSIS — M5136 Other intervertebral disc degeneration, lumbar region: Secondary | ICD-10-CM

## 2019-03-10 DIAGNOSIS — E113593 Type 2 diabetes mellitus with proliferative diabetic retinopathy without macular edema, bilateral: Secondary | ICD-10-CM

## 2019-03-10 DIAGNOSIS — Z7982 Long term (current) use of aspirin: Secondary | ICD-10-CM

## 2019-03-10 DIAGNOSIS — E1142 Type 2 diabetes mellitus with diabetic polyneuropathy: Secondary | ICD-10-CM

## 2019-03-10 DIAGNOSIS — Z8673 Personal history of transient ischemic attack (TIA), and cerebral infarction without residual deficits: Secondary | ICD-10-CM

## 2019-03-10 DIAGNOSIS — IMO0002 Reserved for concepts with insufficient information to code with codable children: Secondary | ICD-10-CM

## 2019-03-10 DIAGNOSIS — I739 Peripheral vascular disease, unspecified: Secondary | ICD-10-CM

## 2019-03-10 DIAGNOSIS — I70212 Atherosclerosis of native arteries of extremities with intermittent claudication, left leg: Secondary | ICD-10-CM

## 2019-03-10 DIAGNOSIS — E1165 Type 2 diabetes mellitus with hyperglycemia: Secondary | ICD-10-CM

## 2019-03-10 DIAGNOSIS — Z794 Long term (current) use of insulin: Secondary | ICD-10-CM

## 2019-03-10 NOTE — Progress Notes (Signed)
Received orders from [facility]. Start of care 02/27/2019. Certification for new care dates 02/27/2019 through 04/27/2019. Orders are reviewed, signed and faxed.

## 2019-03-13 DIAGNOSIS — H353211 Exudative age-related macular degeneration, right eye, with active choroidal neovascularization: Secondary | ICD-10-CM | POA: Diagnosis not present

## 2019-03-14 DIAGNOSIS — I70212 Atherosclerosis of native arteries of extremities with intermittent claudication, left leg: Secondary | ICD-10-CM | POA: Diagnosis not present

## 2019-03-14 DIAGNOSIS — E1151 Type 2 diabetes mellitus with diabetic peripheral angiopathy without gangrene: Secondary | ICD-10-CM | POA: Diagnosis not present

## 2019-03-14 DIAGNOSIS — M48061 Spinal stenosis, lumbar region without neurogenic claudication: Secondary | ICD-10-CM | POA: Diagnosis not present

## 2019-03-14 DIAGNOSIS — E1142 Type 2 diabetes mellitus with diabetic polyneuropathy: Secondary | ICD-10-CM | POA: Diagnosis not present

## 2019-03-14 DIAGNOSIS — M5136 Other intervertebral disc degeneration, lumbar region: Secondary | ICD-10-CM | POA: Diagnosis not present

## 2019-03-14 DIAGNOSIS — M5442 Lumbago with sciatica, left side: Secondary | ICD-10-CM | POA: Diagnosis not present

## 2019-03-16 DIAGNOSIS — E1142 Type 2 diabetes mellitus with diabetic polyneuropathy: Secondary | ICD-10-CM | POA: Diagnosis not present

## 2019-03-16 DIAGNOSIS — M5442 Lumbago with sciatica, left side: Secondary | ICD-10-CM | POA: Diagnosis not present

## 2019-03-16 DIAGNOSIS — M5136 Other intervertebral disc degeneration, lumbar region: Secondary | ICD-10-CM | POA: Diagnosis not present

## 2019-03-16 DIAGNOSIS — E1151 Type 2 diabetes mellitus with diabetic peripheral angiopathy without gangrene: Secondary | ICD-10-CM | POA: Diagnosis not present

## 2019-03-16 DIAGNOSIS — I70212 Atherosclerosis of native arteries of extremities with intermittent claudication, left leg: Secondary | ICD-10-CM | POA: Diagnosis not present

## 2019-03-16 DIAGNOSIS — M48061 Spinal stenosis, lumbar region without neurogenic claudication: Secondary | ICD-10-CM | POA: Diagnosis not present

## 2019-03-22 DIAGNOSIS — E1151 Type 2 diabetes mellitus with diabetic peripheral angiopathy without gangrene: Secondary | ICD-10-CM | POA: Diagnosis not present

## 2019-03-22 DIAGNOSIS — I70212 Atherosclerosis of native arteries of extremities with intermittent claudication, left leg: Secondary | ICD-10-CM | POA: Diagnosis not present

## 2019-03-22 DIAGNOSIS — M5136 Other intervertebral disc degeneration, lumbar region: Secondary | ICD-10-CM | POA: Diagnosis not present

## 2019-03-22 DIAGNOSIS — M5442 Lumbago with sciatica, left side: Secondary | ICD-10-CM | POA: Diagnosis not present

## 2019-03-22 DIAGNOSIS — E1142 Type 2 diabetes mellitus with diabetic polyneuropathy: Secondary | ICD-10-CM | POA: Diagnosis not present

## 2019-03-22 DIAGNOSIS — M48061 Spinal stenosis, lumbar region without neurogenic claudication: Secondary | ICD-10-CM | POA: Diagnosis not present

## 2019-03-25 ENCOUNTER — Other Ambulatory Visit: Payer: Self-pay | Admitting: Internal Medicine

## 2019-03-29 DIAGNOSIS — Z794 Long term (current) use of insulin: Secondary | ICD-10-CM | POA: Diagnosis not present

## 2019-03-29 DIAGNOSIS — E113593 Type 2 diabetes mellitus with proliferative diabetic retinopathy without macular edema, bilateral: Secondary | ICD-10-CM | POA: Diagnosis not present

## 2019-03-29 DIAGNOSIS — E1142 Type 2 diabetes mellitus with diabetic polyneuropathy: Secondary | ICD-10-CM | POA: Diagnosis not present

## 2019-03-29 DIAGNOSIS — Z7982 Long term (current) use of aspirin: Secondary | ICD-10-CM | POA: Diagnosis not present

## 2019-03-29 DIAGNOSIS — Z87891 Personal history of nicotine dependence: Secondary | ICD-10-CM | POA: Diagnosis not present

## 2019-03-29 DIAGNOSIS — M48061 Spinal stenosis, lumbar region without neurogenic claudication: Secondary | ICD-10-CM | POA: Diagnosis not present

## 2019-03-29 DIAGNOSIS — E1165 Type 2 diabetes mellitus with hyperglycemia: Secondary | ICD-10-CM | POA: Diagnosis not present

## 2019-03-29 DIAGNOSIS — M5442 Lumbago with sciatica, left side: Secondary | ICD-10-CM | POA: Diagnosis not present

## 2019-03-29 DIAGNOSIS — I70212 Atherosclerosis of native arteries of extremities with intermittent claudication, left leg: Secondary | ICD-10-CM | POA: Diagnosis not present

## 2019-03-29 DIAGNOSIS — M5136 Other intervertebral disc degeneration, lumbar region: Secondary | ICD-10-CM | POA: Diagnosis not present

## 2019-03-29 DIAGNOSIS — Z7902 Long term (current) use of antithrombotics/antiplatelets: Secondary | ICD-10-CM | POA: Diagnosis not present

## 2019-03-29 DIAGNOSIS — E1151 Type 2 diabetes mellitus with diabetic peripheral angiopathy without gangrene: Secondary | ICD-10-CM | POA: Diagnosis not present

## 2019-03-29 DIAGNOSIS — Z8673 Personal history of transient ischemic attack (TIA), and cerebral infarction without residual deficits: Secondary | ICD-10-CM | POA: Diagnosis not present

## 2019-04-06 DIAGNOSIS — M48061 Spinal stenosis, lumbar region without neurogenic claudication: Secondary | ICD-10-CM | POA: Diagnosis not present

## 2019-04-06 DIAGNOSIS — E1151 Type 2 diabetes mellitus with diabetic peripheral angiopathy without gangrene: Secondary | ICD-10-CM | POA: Diagnosis not present

## 2019-04-06 DIAGNOSIS — I70212 Atherosclerosis of native arteries of extremities with intermittent claudication, left leg: Secondary | ICD-10-CM | POA: Diagnosis not present

## 2019-04-06 DIAGNOSIS — M5136 Other intervertebral disc degeneration, lumbar region: Secondary | ICD-10-CM | POA: Diagnosis not present

## 2019-04-06 DIAGNOSIS — E1142 Type 2 diabetes mellitus with diabetic polyneuropathy: Secondary | ICD-10-CM | POA: Diagnosis not present

## 2019-04-06 DIAGNOSIS — M5442 Lumbago with sciatica, left side: Secondary | ICD-10-CM | POA: Diagnosis not present

## 2019-04-10 DIAGNOSIS — M5136 Other intervertebral disc degeneration, lumbar region: Secondary | ICD-10-CM | POA: Diagnosis not present

## 2019-04-10 DIAGNOSIS — E1142 Type 2 diabetes mellitus with diabetic polyneuropathy: Secondary | ICD-10-CM | POA: Diagnosis not present

## 2019-04-10 DIAGNOSIS — I70212 Atherosclerosis of native arteries of extremities with intermittent claudication, left leg: Secondary | ICD-10-CM | POA: Diagnosis not present

## 2019-04-10 DIAGNOSIS — E1151 Type 2 diabetes mellitus with diabetic peripheral angiopathy without gangrene: Secondary | ICD-10-CM | POA: Diagnosis not present

## 2019-04-10 DIAGNOSIS — M48061 Spinal stenosis, lumbar region without neurogenic claudication: Secondary | ICD-10-CM | POA: Diagnosis not present

## 2019-04-10 DIAGNOSIS — M5442 Lumbago with sciatica, left side: Secondary | ICD-10-CM | POA: Diagnosis not present

## 2019-04-12 DIAGNOSIS — I70212 Atherosclerosis of native arteries of extremities with intermittent claudication, left leg: Secondary | ICD-10-CM | POA: Diagnosis not present

## 2019-04-12 DIAGNOSIS — M5442 Lumbago with sciatica, left side: Secondary | ICD-10-CM | POA: Diagnosis not present

## 2019-04-12 DIAGNOSIS — M48061 Spinal stenosis, lumbar region without neurogenic claudication: Secondary | ICD-10-CM | POA: Diagnosis not present

## 2019-04-12 DIAGNOSIS — E1151 Type 2 diabetes mellitus with diabetic peripheral angiopathy without gangrene: Secondary | ICD-10-CM | POA: Diagnosis not present

## 2019-04-12 DIAGNOSIS — M5136 Other intervertebral disc degeneration, lumbar region: Secondary | ICD-10-CM | POA: Diagnosis not present

## 2019-04-12 DIAGNOSIS — E1142 Type 2 diabetes mellitus with diabetic polyneuropathy: Secondary | ICD-10-CM | POA: Diagnosis not present

## 2019-04-17 DIAGNOSIS — E1151 Type 2 diabetes mellitus with diabetic peripheral angiopathy without gangrene: Secondary | ICD-10-CM | POA: Diagnosis not present

## 2019-04-17 DIAGNOSIS — E1142 Type 2 diabetes mellitus with diabetic polyneuropathy: Secondary | ICD-10-CM | POA: Diagnosis not present

## 2019-04-17 DIAGNOSIS — M48061 Spinal stenosis, lumbar region without neurogenic claudication: Secondary | ICD-10-CM | POA: Diagnosis not present

## 2019-04-17 DIAGNOSIS — I70212 Atherosclerosis of native arteries of extremities with intermittent claudication, left leg: Secondary | ICD-10-CM | POA: Diagnosis not present

## 2019-04-17 DIAGNOSIS — M5136 Other intervertebral disc degeneration, lumbar region: Secondary | ICD-10-CM | POA: Diagnosis not present

## 2019-04-17 DIAGNOSIS — M5442 Lumbago with sciatica, left side: Secondary | ICD-10-CM | POA: Diagnosis not present

## 2019-04-19 DIAGNOSIS — M48061 Spinal stenosis, lumbar region without neurogenic claudication: Secondary | ICD-10-CM | POA: Diagnosis not present

## 2019-04-19 DIAGNOSIS — E1151 Type 2 diabetes mellitus with diabetic peripheral angiopathy without gangrene: Secondary | ICD-10-CM | POA: Diagnosis not present

## 2019-04-19 DIAGNOSIS — M5442 Lumbago with sciatica, left side: Secondary | ICD-10-CM | POA: Diagnosis not present

## 2019-04-19 DIAGNOSIS — M5136 Other intervertebral disc degeneration, lumbar region: Secondary | ICD-10-CM | POA: Diagnosis not present

## 2019-04-19 DIAGNOSIS — E1142 Type 2 diabetes mellitus with diabetic polyneuropathy: Secondary | ICD-10-CM | POA: Diagnosis not present

## 2019-04-19 DIAGNOSIS — I70212 Atherosclerosis of native arteries of extremities with intermittent claudication, left leg: Secondary | ICD-10-CM | POA: Diagnosis not present

## 2019-04-22 ENCOUNTER — Other Ambulatory Visit (INDEPENDENT_AMBULATORY_CARE_PROVIDER_SITE_OTHER): Payer: Self-pay | Admitting: Vascular Surgery

## 2019-05-01 ENCOUNTER — Other Ambulatory Visit: Payer: Self-pay

## 2019-05-01 ENCOUNTER — Ambulatory Visit (INDEPENDENT_AMBULATORY_CARE_PROVIDER_SITE_OTHER): Payer: Medicare Other | Admitting: Internal Medicine

## 2019-05-01 ENCOUNTER — Encounter: Payer: Self-pay | Admitting: Internal Medicine

## 2019-05-01 VITALS — BP 116/62 | HR 63 | Ht 61.0 in | Wt 115.4 lb

## 2019-05-01 DIAGNOSIS — E1165 Type 2 diabetes mellitus with hyperglycemia: Secondary | ICD-10-CM

## 2019-05-01 DIAGNOSIS — Z794 Long term (current) use of insulin: Secondary | ICD-10-CM

## 2019-05-01 DIAGNOSIS — E1142 Type 2 diabetes mellitus with diabetic polyneuropathy: Secondary | ICD-10-CM

## 2019-05-01 DIAGNOSIS — M5417 Radiculopathy, lumbosacral region: Secondary | ICD-10-CM | POA: Insufficient documentation

## 2019-05-01 DIAGNOSIS — I10 Essential (primary) hypertension: Secondary | ICD-10-CM

## 2019-05-01 DIAGNOSIS — IMO0002 Reserved for concepts with insufficient information to code with codable children: Secondary | ICD-10-CM

## 2019-05-01 DIAGNOSIS — R636 Underweight: Secondary | ICD-10-CM | POA: Diagnosis not present

## 2019-05-01 DIAGNOSIS — I6523 Occlusion and stenosis of bilateral carotid arteries: Secondary | ICD-10-CM | POA: Diagnosis not present

## 2019-05-01 MED ORDER — GABAPENTIN 100 MG PO CAPS: 100.0000 mg | ORAL_CAPSULE | Freq: Every day | ORAL | 0 refills | Status: DC

## 2019-05-01 NOTE — Progress Notes (Signed)
Date:  05/01/2019   Name:  Jill Hudson   DOB:  Jan 21, 1935   MRN:  [id number]   Chief Complaint: Diabetes and Hypertension  Diabetes She presents for her follow-up diabetic visit. She has type 2 diabetes mellitus. Her disease course has been stable. Pertinent negatives for hypoglycemia include no headaches or tremors. Pertinent negatives for diabetes include no chest pain, no fatigue, no polydipsia and no polyuria. Current diabetic treatment includes oral agent (monotherapy) and insulin injections. She is compliant with treatment all of the time. Weight trend: increasing gradually. She monitors blood glucose at home 3-4 x per day. An ACE inhibitor/angiotensin II receptor blocker is being taken.  Hypertension This is a chronic problem. The problem is controlled. Pertinent negatives include no chest pain, headaches, palpitations or shortness of breath. Past treatments include angiotensin blockers, diuretics and calcium channel blockers. The current treatment provides significant improvement.  Weight loss - she has been drinking Ensure daily and trying to eat better at meals.  She is also taking short acting insulin now with breakfast. Back pain - with left leg weakness.  MRI showed HNP at 2 levels.  Seen by Dr. Lacinda Axon Neurosurgery who offered micro discectomy.  She is considering it.  Lab Results  Component Value Date   HGBA1C 8.3 02/10/2019   Lab Results  Component Value Date   HGBA1C 8.3 02/10/2019    Lab Results  Component Value Date   CREATININE 1.17 (H) 12/26/2018   BUN 22 12/26/2018   NA 134 12/26/2018   K 4.4 12/26/2018   CL 98 12/26/2018   CO2 19 (L) 12/26/2018     Review of Systems  Constitutional: Negative for appetite change, fatigue, fever and unexpected weight change (has gained about 10 lbs.).  HENT: Negative for tinnitus and trouble swallowing.   Eyes: Negative for visual disturbance.  Respiratory: Negative for cough, chest tightness and shortness of breath.    Cardiovascular: Negative for chest pain, palpitations and leg swelling.  Gastrointestinal: Negative for abdominal pain.  Endocrine: Negative for polydipsia and polyuria.  Genitourinary: Negative for dysuria and hematuria.  Musculoskeletal: Positive for arthralgias, back pain (L4 radiculopathy on left with gait disturbance) and gait problem.  Neurological: Negative for tremors, numbness and headaches.  Psychiatric/Behavioral: Negative for dysphoric mood and sleep disturbance.    Patient Active Problem List   Diagnosis Date Noted  . Encounter for long-term (current) use of aspirin 10/31/2018  . Encounter for long-term (current) use of antiplatelets/antithrombotics 10/31/2018  . Long term current use of oral hypoglycemic drug 10/31/2018  . Encounter for long-term (current) use of insulin ([place]) 10/31/2018  . GIB (gastrointestinal bleeding) 08/11/2018  . Leg pain 07/03/2017  . Carpal tunnel syndrome on both sides 05/05/2017  . Myalgia due to HMG CoA reductase inhibitor 05/05/2017  . History of CVA (cerebrovascular accident) 02/15/2017  . Degenerative disc disease, lumbar 12/21/2016  . Atherosclerosis of native arteries of extremity with intermittent claudication ([place]) 12/20/2016  . Bilateral carotid artery stenosis 12/20/2016  . Hip bursitis 05/18/2016  . Elevated TSH 01/18/2016  . Chronic renal insufficiency, stage III (moderate) ([place]) 01/16/2016  . Senile ecchymosis 01/16/2016  . Uncontrolled type 2 diabetes mellitus with diabetic polyneuropathy, with long-term current use of insulin ([place]) 09/16/2015  . GERD (gastroesophageal reflux disease) 07/24/2015  . PAD (peripheral artery disease) ([place]) 05/17/2015  . Neoplasm of uncertain behavior of skin 05/17/2015  . Retinopathy, diabetic, proliferative ([place]) 04/11/2015  . Mixed hyperlipidemia 04/11/2015  . Essential (primary) hypertension 04/11/2015  .  Generalized OA 04/11/2015  . Arteriosclerosis of coronary artery 05/29/2013  .  Hypertensive heart disease without CHF 05/29/2013    Allergies  Allergen Reactions  . Saxagliptin Diarrhea  . Epinephrine Other (See Comments)    Patient does not remember what happens when she uses this  . Atorvastatin Other (See Comments)    Muscle aches  . Codeine Other (See Comments)    Upset stomach  . Ezetimibe Other (See Comments)    Myalgias(ZETIA)  . Limonene Rash    Patient does not recall this reaction  . Nitrofurantoin Rash and Other (See Comments)    Pruitus  . Sulfa Antibiotics Rash and Other (See Comments)    Sore mouth     Past Surgical History:  Procedure Laterality Date  . CARDIAC CATHETERIZATION  1998   40% LM, 95% Ramus interm  . CATARACT EXTRACTION, BILATERAL    . COLONOSCOPY WITH PROPOFOL N/A 08/12/2018   Procedure: COLONOSCOPY WITH PROPOFOL;  Surgeon: Lucilla Lame, MD;  Location: [facility] ENDOSCOPY;  Service: Endoscopy;  Laterality: N/A;  . ENDARTERECTOMY FEMORAL Left 10/12/2018   Procedure: ENDARTERECTOMY FEMORAL;  Surgeon: Katha Cabal, MD;  Location: [facility] ORS;  Service: Vascular;  Laterality: Left;  angioplasty and left SFA stent placement  . EYE SURGERY Bilateral    cataract extractions  . LOWER EXTREMITY ANGIOGRAPHY Left 08/23/2017   Procedure: Lower Extremity Angiography;  Surgeon: Algernon Huxley, MD;  Location: [place] CV LAB;  Service: Cardiovascular;  Laterality: Left;  . LOWER EXTREMITY ANGIOGRAPHY Left 07/26/2018   Procedure: LOWER EXTREMITY ANGIOGRAPHY;  Surgeon: Katha Cabal, MD;  Location: [place] [facility] LAB;  Service: Cardiovascular;  Laterality: Left;  . LOWER EXTREMITY ANGIOGRAPHY Left 09/16/2018   Procedure: LOWER EXTREMITY ANGIOGRAPHY;  Surgeon: Katha Cabal, MD;  Location: [place] [facility];  Service: Cardiovascular;  Laterality: Left;  . PTCA  08/2013   Left common iliac  . PTCA  12/2012   left ext iliac  . TUBAL LIGATION      Social History   Tobacco Use  . Smoking status: Former Smoker    Packs/day:  2.00    Years: 37.00    Pack years: 74.00    Types: Cigarettes    Quit date: 1980    Years since quitting: 40.5  . Smokeless tobacco: Never Used  . Tobacco comment: smoking cessation materials not required  Substance Use Topics  . Alcohol use: No    Alcohol/week: 0.0 standard drinks  . Drug use: No     Medication list has been reviewed and updated.  Current Meds  Medication Sig  . acetaminophen (TYLENOL) 650 MG CR tablet Take 1,300 mg by mouth every 8 (eight) hours as needed for pain.  Marland Kitchen amLODipine (NORVASC) 2.5 MG tablet Take 2.5 mg by mouth daily.  Marland Kitchen aspirin 81 MG tablet Take 81 mg by mouth daily.   . B Complex Vitamins (VITAMIN-B COMPLEX) TABS Take 1 tablet by mouth daily.   . B-D ULTRAFINE III SHORT PEN 31G X 8 MM MISC USE AS DIRECTED  . baclofen (LIORESAL) 10 MG tablet TAKE 1 TABLET(10 MG) BY MOUTH THREE TIMES DAILY (Patient taking differently: Take 10 mg by mouth at bedtime. )  . chlorthalidone (HYGROTON) 25 MG tablet Take 25 mg by mouth daily.  . clopidogrel (PLAVIX) 75 MG tablet TAKE 1 TABLET BY MOUTH EVERY DAY  . Cyanocobalamin (RA VITAMIN B-12 TR) 1000 MCG TBCR Take 1,000 mcg by mouth daily.   Marland Kitchen diltiazem (CARTIA XT) 180 MG  24 hr capsule Take 180 mg by mouth 2 (two) times daily.   . ferrous sulfate 325 (65 FE) MG tablet Take 325 mg by mouth daily with breakfast.  . gabapentin (NEURONTIN) 100 MG capsule Take 100 mg by mouth at bedtime.  . insulin aspart (NOVOLOG FLEXPEN) 100 UNIT/ML FlexPen Inject 3 Units into the skin 3 (three) times daily with meals. At lunch (Patient taking differently: Inject 1 Units into the skin 3 (three) times daily with meals. At lunch)  . Insulin Glargine (BASAGLAR KWIKPEN) 100 UNIT/ML SOPN Inject 12 Units into the skin daily.  Marland Kitchen losartan (COZAAR) 50 MG tablet Take 50 mg by mouth 2 (two) times daily.   . metFORMIN (GLUCOPHAGE-XR) 500 MG 24 hr tablet TAKE 1 TABLET BY MOUTH TWICE DAILY  . Multiple Vitamins-Minerals (PRESERVISION AREDS PO) Take 1  capsule by mouth 2 (two) times daily.   . nitroGLYCERIN (NITROSTAT) 0.4 MG SL tablet 1 tab under the tongue for CP, may repeat in 5 minutes up to 3 doses  . rosuvastatin (CRESTOR) 5 MG tablet Take 5 mg by mouth every other day.   . traMADol (ULTRAM) 50 MG tablet Take 1 tablet (50 mg total) by mouth every 8 (eight) hours as needed.    PHQ 2/9 Scores 05/01/2019 12/26/2018 06/15/2018 03/07/2018  PHQ - 2 Score 0 0 0 0  PHQ- 9 Score 1 - 0 -    BP Readings from Last 3 Encounters:  05/01/19 116/62  01/23/19 (!) 169/58  12/26/18 98/60    Physical Exam Vitals signs and nursing note reviewed.  Constitutional:      General: She is not in acute distress.    Appearance: She is well-developed.  HENT:     Head: Normocephalic and atraumatic.  Neck:     Musculoskeletal: Normal range of motion.  Cardiovascular:     Rate and Rhythm: Normal rate and regular rhythm.     Heart sounds: No murmur.  Pulmonary:     Effort: Pulmonary effort is normal. No respiratory distress.     Breath sounds: No wheezing or rhonchi.  Musculoskeletal: Normal range of motion.     Left lower leg: Edema present.  Lymphadenopathy:     Cervical: No cervical adenopathy.  Skin:    General: Skin is warm and dry.     Capillary Refill: Capillary refill takes less than 2 seconds.     Findings: No rash.  Neurological:     Mental Status: She is alert and oriented to person, place, and time.  Psychiatric:        Behavior: Behavior normal.        Thought Content: Thought content normal.     Wt Readings from Last 3 Encounters:  05/01/19 115 lb 6.4 oz (52.3 kg)  01/23/19 104 lb (47.2 kg)  12/26/18 109 lb 6.4 oz (49.6 kg)    BP 116/62   Pulse 63   Ht 5\' 1"  (1.549 m)   Wt 115 lb 6.4 oz (52.3 kg)   SpO2 97%   BMI 21.80 kg/m   Assessment and Plan: 1. Essential (primary) hypertension Controlled on current therapy  2. Uncontrolled type 2 diabetes mellitus with diabetic polyneuropathy, with long-term current use of insulin  ([place]) Continue Basaglar and Novolog Follow up with Endocrinology  3. Lumbosacral radiculopathy at L4 Affecting her left leg and ambulation Offered discectomy - she is considering this  4. Underweight Continue Ensure Novolog with meals   Partially dictated using Editor, commissioning. Any errors are unintentional.  Mickel Baas  Army Melia, [facility]  05/01/2019

## 2019-05-22 DIAGNOSIS — H353211 Exudative age-related macular degeneration, right eye, with active choroidal neovascularization: Secondary | ICD-10-CM | POA: Diagnosis not present

## 2019-05-22 DIAGNOSIS — H353221 Exudative age-related macular degeneration, left eye, with active choroidal neovascularization: Secondary | ICD-10-CM | POA: Diagnosis not present

## 2019-05-29 ENCOUNTER — Other Ambulatory Visit: Payer: Self-pay

## 2019-05-29 ENCOUNTER — Ambulatory Visit (INDEPENDENT_AMBULATORY_CARE_PROVIDER_SITE_OTHER): Payer: Medicare Other

## 2019-05-29 ENCOUNTER — Encounter (INDEPENDENT_AMBULATORY_CARE_PROVIDER_SITE_OTHER): Payer: Self-pay | Admitting: Vascular Surgery

## 2019-05-29 ENCOUNTER — Ambulatory Visit (INDEPENDENT_AMBULATORY_CARE_PROVIDER_SITE_OTHER): Payer: Medicare Other | Admitting: Vascular Surgery

## 2019-05-29 VITALS — BP 179/64 | HR 67 | Resp 12 | Ht 61.5 in | Wt 116.0 lb

## 2019-05-29 DIAGNOSIS — I70212 Atherosclerosis of native arteries of extremities with intermittent claudication, left leg: Secondary | ICD-10-CM

## 2019-05-29 DIAGNOSIS — I10 Essential (primary) hypertension: Secondary | ICD-10-CM

## 2019-05-29 DIAGNOSIS — I6523 Occlusion and stenosis of bilateral carotid arteries: Secondary | ICD-10-CM

## 2019-05-29 DIAGNOSIS — K219 Gastro-esophageal reflux disease without esophagitis: Secondary | ICD-10-CM | POA: Diagnosis not present

## 2019-05-29 DIAGNOSIS — I2510 Atherosclerotic heart disease of native coronary artery without angina pectoris: Secondary | ICD-10-CM | POA: Diagnosis not present

## 2019-05-29 DIAGNOSIS — I739 Peripheral vascular disease, unspecified: Secondary | ICD-10-CM

## 2019-05-29 NOTE — Progress Notes (Signed)
MRN : [id number]  Jill Hudson is a 84 y.o. (Oct 22, 1935) female who presents with chief complaint of No chief complaint on file. Marland Kitchen  History of Present Illness:   The patient is seen for follow up evaluation of carotid stenosis. The carotid stenosis followed by ultrasound.   The patient denies amaurosis fugax. There is no recent history of TIA symptoms or focal motor deficits. There is no prior documented CVA.  The patient is taking enteric-coated aspirin 81 mg daily.  The patient also presents today for followup and review of the noninvasive studies. There have been no interval changes in lower extremity symptoms. No interval shortening of the patient's claudication distance or development of rest pain symptoms. No new ulcers or wounds have occurred since the last visit.  The patient has a history of coronary artery disease, no recent episodes of angina or shortness of breath. There is a history of hyperlipidemia which is being treated with a statin  ABI Rt=[place] and Lt=1.06  (previous ABI's Rt=1.32 and Lt=1.16) Duplex ultrasound of the carotid arteries shows RICA 65-46% stenosis and LICA 50-35% stenosis.  No change compared to the last studies  No outpatient medications have been marked as taking for the 05/29/19 encounter (Appointment) with [name], Dolores Lory, MD.    Past Medical History:  Diagnosis Date   Allergies    Anxiety    Arthritis    spine and shoulder   Atherosclerosis of artery of extremity with rest pain ([name]) 10/12/2018   Cancer ([name])    skin   Diabetes mellitus without complication ([name])    Heart murmur    Hyperlipidemia    Hypertension    Macula lutea degeneration    Mitral and aortic valve disease    Myocardial infarction ([name])    may have had a "light" heart attack   Occasional tremors    PAD (peripheral artery disease) ([name])    Shingles    patient unaware but daughter confirms. it was a long time ago   Stroke [name]) 01/2017   may  have had a slight stroke   TIA (transient ischemic attack) 01/2017   UTI (urinary tract infection)    Vascular disease, peripheral ([name])     Past Surgical History:  Procedure Laterality Date   CARDIAC CATHETERIZATION  1998   40% LM, 95% Ramus interm   CATARACT EXTRACTION, BILATERAL     COLONOSCOPY WITH PROPOFOL N/A 08/12/2018   Procedure: COLONOSCOPY WITH PROPOFOL;  Surgeon: Lucilla Lame, MD;  Location: [facility] ENDOSCOPY;  Service: Endoscopy;  Laterality: N/A;   ENDARTERECTOMY FEMORAL Left 10/12/2018   Procedure: ENDARTERECTOMY FEMORAL;  Surgeon: Katha Cabal, MD;  Location: [facility] ORS;  Service: Vascular;  Laterality: Left;  angioplasty and left SFA stent placement   EYE SURGERY Bilateral    cataract extractions   LOWER EXTREMITY ANGIOGRAPHY Left 08/23/2017   Procedure: Lower Extremity Angiography;  Surgeon: Algernon Huxley, MD;  Location: [place] CV LAB;  Service: Cardiovascular;  Laterality: Left;   LOWER EXTREMITY ANGIOGRAPHY Left 07/26/2018   Procedure: LOWER EXTREMITY ANGIOGRAPHY;  Surgeon: Katha Cabal, MD;  Location: [place] [facility] LAB;  Service: Cardiovascular;  Laterality: Left;   LOWER EXTREMITY ANGIOGRAPHY Left 09/16/2018   Procedure: LOWER EXTREMITY ANGIOGRAPHY;  Surgeon: Katha Cabal, MD;  Location: [place] [facility] LAB;  Service: Cardiovascular;  Laterality: Left;   PTCA  08/2013   Left common iliac   PTCA  12/2012   left ext iliac   TUBAL LIGATION  Social History Social History   Tobacco Use   Smoking status: Former Smoker    Packs/day: 2.00    Years: 37.00    Pack years: 74.00    Types: Cigarettes    Quit date: 1980    Years since quitting: 40.6   Smokeless tobacco: Never Used   Tobacco comment: smoking cessation materials not required  Substance Use Topics   Alcohol use: No    Alcohol/week: 0.0 standard drinks   Drug use: No    Family History Family History  Problem Relation Age of Onset   Dementia Mother      Diabetes Father     Allergies  Allergen Reactions   Saxagliptin Diarrhea   Epinephrine Other (See Comments)    Patient does not remember what happens when she uses this   Atorvastatin Other (See Comments)    Muscle aches   Codeine Other (See Comments)    Upset stomach   Ezetimibe Other (See Comments)    Myalgias(ZETIA)   Limonene Rash    Patient does not recall this reaction   Nitrofurantoin Rash and Other (See Comments)    Pruitus   Sulfa Antibiotics Rash and Other (See Comments)    Sore mouth      REVIEW OF SYSTEMS (Negative unless checked)  Constitutional: [] Weight loss  [] Fever  [] Chills Cardiac: [] Chest pain   [] Chest pressure   [] Palpitations   [] Shortness of breath when laying flat   [] Shortness of breath with exertion. Vascular:  [x] Pain in legs with walking   [] Pain in legs at rest  [] History of DVT   [] Phlebitis   [] Swelling in legs   [] Varicose veins   [] Non-healing ulcers Pulmonary:   [] Uses home oxygen   [] Productive cough   [] Hemoptysis   [] Wheeze  [] COPD   [] Asthma Neurologic:  [] Dizziness   [] Seizures   [] History of stroke   [] History of TIA  [] Aphasia   [] Vissual changes   [] Weakness or numbness in arm   [] Weakness or numbness in leg Musculoskeletal:   [] Joint swelling   [] Joint pain   [] Low back pain Hematologic:  [] Easy bruising  [] Easy bleeding   [] Hypercoagulable state   [] Anemic Gastrointestinal:  [] Diarrhea   [] Vomiting  [x] Gastroesophageal reflux/heartburn   [] Difficulty swallowing. Genitourinary:  [] Chronic kidney disease   [] Difficult urination  [] Frequent urination   [] Blood in urine Skin:  [] Rashes   [] Ulcers  Psychological:  [] History of anxiety   []  History of major depression.  Physical Examination  There were no vitals filed for this visit. There is no height or weight on file to calculate BMI. Gen: WD/WN, NAD Head: [place]/AT, No temporalis wasting.  Ear/Nose/Throat: Hearing grossly intact, nares w/o erythema or drainage Eyes: PER,  EOMI, sclera nonicteric.  Neck: Supple, no large masses.   Pulmonary:  Good air movement, no audible wheezing bilaterally, no use of accessory muscles.  Cardiac: RRR, no JVD Vascular: Bilateral carotid bruits  Vessel Right Left  Radial Palpable Palpable  Carotid Palpable Palpable  PT Trace Palpable Trace Palpable  DP Trace Palpable Trace Palpable  Gastrointestinal: Non-distended. No guarding/no peritoneal signs.  Musculoskeletal: M/S 5/5 throughout.  No deformity or atrophy.  Neurologic: CN 2-12 intact. Symmetrical.  Speech is fluent. Motor exam as listed above. Psychiatric: Judgment intact, Mood & affect appropriate for pt's clinical situation. Dermatologic: No rashes or ulcers noted.  No changes consistent with cellulitis. Lymph : No lichenification or skin changes of chronic lymphedema.  CBC Lab Results  Component Value Date   WBC  7.1 11/02/2018   HGB 9.7 (L) 11/02/2018   HCT 31.8 (L) 11/02/2018   MCV 91.1 11/02/2018   PLT 345 11/02/2018    BMET    Component Value Date/Time   NA 134 12/26/2018 1612   NA 138 05/29/2014 1202   K 4.4 12/26/2018 1612   K 5.1 05/29/2014 1202   CL 98 12/26/2018 1612   CL 106 05/29/2014 1202   CO2 19 (L) 12/26/2018 1612   CO2 25 05/29/2014 1202   GLUCOSE 141 (H) 12/26/2018 1612   GLUCOSE 219 (H) 11/02/2018 1557   GLUCOSE 269 (H) 05/29/2014 1202   BUN 22 12/26/2018 1612   BUN 19 (H) 05/29/2014 1202   CREATININE 1.17 (H) 12/26/2018 1612   CREATININE 1.10 05/29/2014 1202   CALCIUM 9.7 12/26/2018 1612   CALCIUM 9.1 05/29/2014 1202   GFRNONAA 43 (L) 12/26/2018 1612   GFRNONAA 48 (L) 05/29/2014 1202   GFRAA 50 (L) 12/26/2018 1612   GFRAA 56 (L) 05/29/2014 1202   CrCl cannot be calculated (Patient's most recent lab result is older than the maximum 21 days allowed.).  COAG Lab Results  Component Value Date   INR 1.08 11/02/2018   INR 1.09 10/06/2018   INR 1.08 08/11/2018    Radiology No results found.   Assessment/Plan 1.  Bilateral carotid artery stenosis Recommend:  Given the patient's asymptomatic subcritical stenosis no further invasive testing or surgery at this time.  Duplex ultrasound demonstrates 40 to 59% diameter reduction bilateral internal carotid arteries.  Continue antiplatelet therapy as prescribed Continue management of CAD, HTN and Hyperlipidemia Healthy heart diet, encouraged exercise at least 4 times per week  Follow up in25months with duplex ultrasound and physical exam   2. PAD (peripheral artery disease) (HCC) Recommend:  The patient has evidence of atherosclerosis of the lower extremities with claudication.  The patient does not voice lifestyle limiting changes at this point in time.  Noninvasive studies do not suggest clinically significant change.  No invasive studies, angiography or surgery at this time The patient should continue walking and begin a more formal exercise program.  The patient should continue antiplatelet therapy and aggressive treatment of the lipid abnormalities  No changes in the patient's medications at this time  The patient should continue wearing graduated compression socks 10-15 mmHg strength to control the mild edema.    3. Arteriosclerosis of coronary artery Continue cardiac and antihypertensive medications as already ordered and reviewed, no changes at this time.  Continue statin as ordered and reviewed, no changes at this time  Nitrates PRN for chest pain   4. Essential (primary) hypertension Continue antihypertensive medications as already ordered, these medications have been reviewed and there are no changes at this time.   5. Gastroesophageal reflux disease without esophagitis Continue PPI as already ordered, this medication has been reviewed and there are no changes at this time.  Avoidence of caffeine and alcohol  Moderate elevation of the head of the bed     Hortencia Pilar, MD  05/29/2019 2:01 PM

## 2019-05-30 ENCOUNTER — Encounter (INDEPENDENT_AMBULATORY_CARE_PROVIDER_SITE_OTHER): Payer: Self-pay | Admitting: Vascular Surgery

## 2019-06-12 DIAGNOSIS — E785 Hyperlipidemia, unspecified: Secondary | ICD-10-CM | POA: Diagnosis not present

## 2019-06-12 DIAGNOSIS — E1169 Type 2 diabetes mellitus with other specified complication: Secondary | ICD-10-CM | POA: Diagnosis not present

## 2019-06-12 DIAGNOSIS — E1159 Type 2 diabetes mellitus with other circulatory complications: Secondary | ICD-10-CM | POA: Diagnosis not present

## 2019-06-12 DIAGNOSIS — I10 Essential (primary) hypertension: Secondary | ICD-10-CM | POA: Diagnosis not present

## 2019-06-12 DIAGNOSIS — Z794 Long term (current) use of insulin: Secondary | ICD-10-CM | POA: Diagnosis not present

## 2019-06-12 DIAGNOSIS — E1142 Type 2 diabetes mellitus with diabetic polyneuropathy: Secondary | ICD-10-CM | POA: Diagnosis not present

## 2019-06-12 LAB — HEMOGLOBIN A1C: Hemoglobin A1C: 6.3

## 2019-06-21 ENCOUNTER — Ambulatory Visit (INDEPENDENT_AMBULATORY_CARE_PROVIDER_SITE_OTHER): Payer: Medicare Other

## 2019-06-21 VITALS — BP 148/54 | Ht 62.0 in | Wt 116.0 lb

## 2019-06-21 DIAGNOSIS — H353120 Nonexudative age-related macular degeneration, left eye, stage unspecified: Secondary | ICD-10-CM | POA: Insufficient documentation

## 2019-06-21 DIAGNOSIS — Z0000 Encounter for general adult medical examination without abnormal findings: Secondary | ICD-10-CM | POA: Diagnosis not present

## 2019-06-21 DIAGNOSIS — E113399 Type 2 diabetes mellitus with moderate nonproliferative diabetic retinopathy without macular edema, unspecified eye: Secondary | ICD-10-CM | POA: Insufficient documentation

## 2019-06-21 DIAGNOSIS — Z1231 Encounter for screening mammogram for malignant neoplasm of breast: Secondary | ICD-10-CM

## 2019-06-21 DIAGNOSIS — H353210 Exudative age-related macular degeneration, right eye, stage unspecified: Secondary | ICD-10-CM | POA: Insufficient documentation

## 2019-06-21 NOTE — Patient Instructions (Signed)
Jill Hudson , Thank you for taking time to come for your Medicare Wellness Visit. I appreciate your ongoing commitment to your health goals. Please review the following plan we discussed and let me know if I can assist you in the future.   Screening recommendations/referrals: Colonoscopy: done 08/12/18 Mammogram: done 10/11/17 Bone Density: done 07/06/18 Recommended yearly ophthalmology/optometry visit for glaucoma screening and checkup Recommended yearly dental visit for hygiene and checkup  Vaccinations: Influenza vaccine: done 07/22/18 Pneumococcal vaccine: done 09/10/14 Tdap vaccine: done 2010 Shingles vaccine: Shingrix discussed. Please contact your pharmacy for coverage information.   Advanced directives: Please bring a copy of your health care power of attorney and living will to the office at your convenience.  Conditions/risks identified: recommend continuing strengthening exercises for mobility  Next appointment: Please follow up in one year for your Medicare Annual Wellness visit.     Preventive Care 82 Years and Older, Female Preventive care refers to lifestyle choices and visits with your health care provider that can promote health and wellness. What does preventive care include?  A yearly physical exam. This is also called an annual well check.  Dental exams once or twice a year.  Routine eye exams. Ask your health care provider how often you should have your eyes checked.  Personal lifestyle choices, including:  Daily care of your teeth and gums.  Regular physical activity.  Eating a healthy diet.  Avoiding tobacco and drug use.  Limiting alcohol use.  Practicing safe sex.  Taking low-dose aspirin every day.  Taking vitamin and mineral supplements as recommended by your health care provider. What happens during an annual well check? The services and screenings done by your health care provider during your annual well check will depend on your age,  overall health, lifestyle risk factors, and family history of disease. Counseling  Your health care provider may ask you questions about your:  Alcohol use.  Tobacco use.  Drug use.  Emotional well-being.  Home and relationship well-being.  Sexual activity.  Eating habits.  History of falls.  Memory and ability to understand (cognition).  Work and work Statistician.  Reproductive health. Screening  You may have the following tests or measurements:  Height, weight, and BMI.  Blood pressure.  Lipid and cholesterol levels. These may be checked every 5 years, or more frequently if you are over 82 years old.  Skin check.  Lung cancer screening. You may have this screening every year starting at age 82 if you have a 30-pack-year history of smoking and currently smoke or have quit within the past 15 years.  Fecal occult blood test (FOBT) of the stool. You may have this test every year starting at age 17.  Flexible sigmoidoscopy or colonoscopy. You may have a sigmoidoscopy every 5 years or a colonoscopy every 10 years starting at age 82.  Hepatitis C blood test.  Hepatitis B blood test.  Sexually transmitted disease (STD) testing.  Diabetes screening. This is done by checking your blood sugar (glucose) after you have not eaten for a while (fasting). You may have this done every 1-3 years.  Bone density scan. This is done to screen for osteoporosis. You may have this done starting at age 21.  Mammogram. This may be done every 1-2 years. Talk to your health care provider about how often you should have regular mammograms. Talk with your health care provider about your test results, treatment options, and if necessary, the need for more tests. Vaccines  Your health care  provider may recommend certain vaccines, such as:  Influenza vaccine. This is recommended every year.  Tetanus, diphtheria, and acellular pertussis (Tdap, Td) vaccine. You may need a Td booster every 10  years.  Zoster vaccine. You may need this after age 10.  Pneumococcal 13-valent conjugate (PCV13) vaccine. One dose is recommended after age 20.  Pneumococcal polysaccharide (PPSV23) vaccine. One dose is recommended after age 29. Talk to your health care provider about which screenings and vaccines you need and how often you need them. This information is not intended to replace advice given to you by your health care provider. Make sure you discuss any questions you have with your health care provider. Document Released: 11/08/2015 Document Revised: 07/01/2016 Document Reviewed: 08/13/2015 Elsevier Interactive Patient Education  2017 [place] Prevention in the Home Falls can cause injuries. They can happen to people of all ages. There are many things you can do to make your home safe and to help prevent falls. What can I do on the outside of my home?  Regularly fix the edges of walkways and driveways and fix any cracks.  Remove anything that might make you trip as you walk through a door, such as a raised step or threshold.  Trim any bushes or trees on the path to your home.  Use bright outdoor lighting.  Clear any walking paths of anything that might make someone trip, such as rocks or tools.  Regularly check to see if handrails are loose or broken. Make sure that both sides of any steps have handrails.  Any raised decks and porches should have guardrails on the edges.  Have any leaves, snow, or ice cleared regularly.  Use sand or salt on walking paths during winter.  Clean up any spills in your garage right away. This includes oil or grease spills. What can I do in the bathroom?  Use night lights.  Install grab bars by the toilet and in the tub and shower. Do not use towel bars as grab bars.  Use non-skid mats or decals in the tub or shower.  If you need to sit down in the shower, use a plastic, non-slip stool.  Keep the floor dry. Clean up any water that  spills on the floor as soon as it happens.  Remove soap buildup in the tub or shower regularly.  Attach bath mats securely with double-sided non-slip rug tape.  Do not have throw rugs and other things on the floor that can make you trip. What can I do in the bedroom?  Use night lights.  Make sure that you have a light by your bed that is easy to reach.  Do not use any sheets or blankets that are too big for your bed. They should not hang down onto the floor.  Have a firm chair that has side arms. You can use this for support while you get dressed.  Do not have throw rugs and other things on the floor that can make you trip. What can I do in the kitchen?  Clean up any spills right away.  Avoid walking on wet floors.  Keep items that you use a lot in easy-to-reach places.  If you need to reach something above you, use a strong step stool that has a grab bar.  Keep electrical cords out of the way.  Do not use floor polish or wax that makes floors slippery. If you must use wax, use non-skid floor wax.  Do not have throw  rugs and other things on the floor that can make you trip. What can I do with my stairs?  Do not leave any items on the stairs.  Make sure that there are handrails on both sides of the stairs and use them. Fix handrails that are broken or loose. Make sure that handrails are as long as the stairways.  Check any carpeting to make sure that it is firmly attached to the stairs. Fix any carpet that is loose or worn.  Avoid having throw rugs at the top or bottom of the stairs. If you do have throw rugs, attach them to the floor with carpet tape.  Make sure that you have a light switch at the top of the stairs and the bottom of the stairs. If you do not have them, ask someone to add them for you. What else can I do to help prevent falls?  Wear shoes that:  Do not have high heels.  Have rubber bottoms.  Are comfortable and fit you well.  Are closed at the  toe. Do not wear sandals.  If you use a stepladder:  Make sure that it is fully opened. Do not climb a closed stepladder.  Make sure that both sides of the stepladder are locked into place.  Ask someone to hold it for you, if possible.  Clearly mark and make sure that you can see:  Any grab bars or handrails.  First and last steps.  Where the edge of each step is.  Use tools that help you move around (mobility aids) if they are needed. These include:  Canes.  Walkers.  Scooters.  Crutches.  Turn on the lights when you go into a dark area. Replace any light bulbs as soon as they burn out.  Set up your furniture so you have a clear path. Avoid moving your furniture around.  If any of your floors are uneven, fix them.  If there are any pets around you, be aware of where they are.  Review your medicines with your doctor. Some medicines can make you feel dizzy. This can increase your chance of falling. Ask your doctor what other things that you can do to help prevent falls. This information is not intended to replace advice given to you by your health care provider. Make sure you discuss any questions you have with your health care provider. Document Released: 08/08/2009 Document Revised: 03/19/2016 Document Reviewed: 11/16/2014 Elsevier Interactive Patient Education  2017 [employer].

## 2019-06-21 NOTE — Progress Notes (Signed)
Subjective:   Jill Hudson is a 84 y.o. female who presents for Medicare Annual (Subsequent) preventive examination.  Virtual Visit via Telephone Note  I connected with Jill Hudson on 06/21/19 at  1:20 PM EDT by telephone and verified that I am speaking with the correct person using two identifiers.  Medicare Annual Wellness visit completed telephonically due to Covid-19 pandemic.   Location: Patient: home Provider: office   I discussed the limitations, risks, security and privacy concerns of performing an evaluation and management service by telephone and the availability of in person appointments. The patient expressed understanding and agreed to proceed.  Some vital signs may be absent or patient reported.   Clemetine Marker, LPN    Review of Systems:   Cardiac Risk Factors include: advanced age (>26men, >74 women);diabetes mellitus;dyslipidemia;hypertension     Objective:     Vitals: BP (!) 148/54   Ht 5\' 2"  (1.575 m)   Wt 116 lb (52.6 kg)   BMI 21.22 kg/m   Body mass index is 21.22 kg/m.  Advanced Directives 06/21/2019 01/23/2019 11/02/2018 10/12/2018 10/12/2018 10/06/2018 09/29/2018  Does Patient Have a Medical Advance Directive? Yes No No Yes Yes Yes Yes  Type of Paramedic of [place];Living will - - [place];Living will - [place];Living will [place];Living will  Does patient want to make changes to medical advance directive? - - No - Patient declined No - Patient declined - - -  Copy of [place] in Chart? No - copy requested - - No - copy requested - - -  Would patient like information on creating a medical advance directive? - No - Patient declined - - - - -    Tobacco Social History   Tobacco Use  Smoking Status Former Smoker  . Packs/day: 2.00  . Years: 37.00  . Pack years: 74.00  . Types: Cigarettes  . Quit date: 48  . Years since quitting: 40.6   Smokeless Tobacco Never Used  Tobacco Comment   smoking cessation materials not required     Counseling given: Not Answered Comment: smoking cessation materials not required   Clinical Intake:  Pre-visit preparation completed: Yes  Pain : No/denies pain     BMI - recorded: 21.22 Nutritional Status: BMI of 19-24  Normal Nutritional Risks: None Diabetes: Yes CBG done?: No Did pt. bring in CBG monitor from home?: No   Nutrition Risk Assessment:  Has the patient had any N/V/D within the last 2 months?  No  Does the patient have any non-healing wounds?  No  Has the patient had any unintentional weight loss or weight gain?  No   Diabetes:  Is the patient diabetic?  Yes  If diabetic, was a CBG obtained today?  No  Did the patient bring in their glucometer from home?  No  How often do you monitor your CBG's? Daily fasting.   Financial Strains and Diabetes Management:  Are you having any financial strains with the device, your supplies or your medication? No .  Does the patient want to be seen by Chronic Care Management for management of their diabetes?  No  Would the patient like to be referred to a Nutritionist or for Diabetic Management?  No   Diabetic Exams:  Diabetic Eye Exam: Completed 08/16/18 positive retinopathy.   Diabetic Foot Exam: Completed 08/23/18.   How often do you need to have someone help you when you read instructions,  pamphlets, or other written materials from your doctor or pharmacy?: 1 - Never  Interpreter Needed?: No  Information entered by :: Clemetine Marker LPN  Past Medical History:  Diagnosis Date  . Allergies   . Anxiety   . Arthritis    spine and shoulder  . Atherosclerosis of artery of extremity with rest pain ([place]) 10/12/2018  . Cancer ([place])    skin  . Diabetes mellitus without complication ([place])   . Heart murmur   . Hyperlipidemia   . Hypertension   . Macula lutea degeneration   . Mitral and aortic valve disease   . Myocardial  infarction [facility])    may have had a "light" heart attack  . Occasional tremors   . PAD (peripheral artery disease) ([place])   . Shingles    patient unaware but daughter confirms. it was a long time ago  . Stroke [facility]) 01/2017   may have had a slight stroke  . TIA (transient ischemic attack) 01/2017  . UTI (urinary tract infection)   . Vascular disease, peripheral [facility])    Past Surgical History:  Procedure Laterality Date  . CARDIAC CATHETERIZATION  1998   40% LM, 95% Ramus interm  . CATARACT EXTRACTION, BILATERAL    . COLONOSCOPY WITH PROPOFOL N/A 08/12/2018   Procedure: COLONOSCOPY WITH PROPOFOL;  Surgeon: Lucilla Lame, MD;  Location: [facility] ENDOSCOPY;  Service: Endoscopy;  Laterality: N/A;  . ENDARTERECTOMY FEMORAL Left 10/12/2018   Procedure: ENDARTERECTOMY FEMORAL;  Surgeon: Katha Cabal, MD;  Location: [facility] ORS;  Service: Vascular;  Laterality: Left;  angioplasty and left SFA stent placement  . EYE SURGERY Bilateral    cataract extractions  . LOWER EXTREMITY ANGIOGRAPHY Left 08/23/2017   Procedure: Lower Extremity Angiography;  Surgeon: Algernon Huxley, MD;  Location: [place] CV LAB;  Service: Cardiovascular;  Laterality: Left;  . LOWER EXTREMITY ANGIOGRAPHY Left 07/26/2018   Procedure: LOWER EXTREMITY ANGIOGRAPHY;  Surgeon: Katha Cabal, MD;  Location: [place] CV LAB;  Service: Cardiovascular;  Laterality: Left;  . LOWER EXTREMITY ANGIOGRAPHY Left 09/16/2018   Procedure: LOWER EXTREMITY ANGIOGRAPHY;  Surgeon: Katha Cabal, MD;  Location: [place] CV LAB;  Service: Cardiovascular;  Laterality: Left;  . PTCA  08/2013   Left common iliac  . PTCA  12/2012   left ext iliac  . TUBAL LIGATION     Family History  Problem Relation Age of Onset  . Dementia Mother   . Diabetes Father    Social History   Socioeconomic History  . Marital status: Married    Spouse name: kermit  . Number of children: 3  . Years of education: Not on file  . Highest  education level: 12th grade  Occupational History  . Occupation: Retired    Comment: homemaker  Social Needs  . Financial resource strain: Not hard at all  . Food insecurity    Worry: Never true    Inability: Never true  . Transportation needs    Medical: No    Non-medical: No  Tobacco Use  . Smoking status: Former Smoker    Packs/day: 2.00    Years: 37.00    Pack years: 74.00    Types: Cigarettes    Quit date: 1980    Years since quitting: 40.6  . Smokeless tobacco: Never Used  . Tobacco comment: smoking cessation materials not required  Substance and Sexual Activity  . Alcohol use: No    Alcohol/week: 0.0 standard drinks  . Drug use: No  .  Sexual activity: Not Currently  Lifestyle  . Physical activity    Days per week: 0 days    Minutes per session: 0 min  . Stress: Not at all  Relationships  . Social Herbalist on phone: Patient refused    Gets together: Patient refused    Attends religious service: Patient refused    Active member of club or organization: Patient refused    Attends meetings of clubs or organizations: Patient refused    Relationship status: Married  Other Topics Concern  . Not on file  Social History Narrative   Husband has dementia and may wander. Requires constant supervision. Pt's daughter also lives with them. Her son Ollen Gross is a neurologist.     Outpatient Encounter Medications as of 06/21/2019  Medication Sig  . acetaminophen (TYLENOL) 650 MG CR tablet Take 1,300 mg by mouth every 8 (eight) hours as needed for pain.  Marland Kitchen amLODipine (NORVASC) 2.5 MG tablet Take 2.5 mg by mouth daily.  Marland Kitchen aspirin 81 MG tablet Take 81 mg by mouth daily.   . B-D ULTRAFINE III SHORT PEN 31G X 8 MM MISC USE AS DIRECTED  . baclofen (LIORESAL) 10 MG tablet TAKE 1 TABLET(10 MG) BY MOUTH THREE TIMES DAILY (Patient taking differently: Take 10 mg by mouth at bedtime. )  . chlorthalidone (HYGROTON) 25 MG tablet Take 25 mg by mouth daily.  . clopidogrel (PLAVIX)  75 MG tablet TAKE 1 TABLET BY MOUTH EVERY DAY  . Cyanocobalamin (RA VITAMIN B-12 TR) 1000 MCG TBCR Take 1,000 mcg by mouth daily.   . ferrous sulfate 325 (65 FE) MG tablet Take 325 mg by mouth daily with breakfast.  . gabapentin (NEURONTIN) 100 MG capsule Take 1-2 capsules (100-200 mg total) by mouth at bedtime.  . insulin aspart (NOVOLOG FLEXPEN) 100 UNIT/ML FlexPen Inject 3 Units into the skin 3 (three) times daily with meals. At lunch (Patient taking differently: Inject 2 Units into the skin 3 (three) times daily with meals. At breakfast)  . Insulin Glargine (BASAGLAR KWIKPEN) 100 UNIT/ML SOPN Inject 12 Units into the skin daily.  Marland Kitchen losartan (COZAAR) 50 MG tablet Take 50 mg by mouth 2 (two) times daily.   . metFORMIN (GLUCOPHAGE-XR) 500 MG 24 hr tablet TAKE 1 TABLET BY MOUTH TWICE DAILY  . Multiple Vitamins-Minerals (PRESERVISION AREDS PO) Take 1 capsule by mouth 2 (two) times daily.   . nitroGLYCERIN (NITROSTAT) 0.4 MG SL tablet 1 tab under the tongue for CP, may repeat in 5 minutes up to 3 doses  . rosuvastatin (CRESTOR) 5 MG tablet Take 5 mg by mouth every other day.   . [DISCONTINUED] B Complex Vitamins (VITAMIN-B COMPLEX) TABS Take 1 tablet by mouth daily.   Marland Kitchen diltiazem (CARTIA XT) 180 MG 24 hr capsule Take 180 mg by mouth 2 (two) times daily.   . traMADol (ULTRAM) 50 MG tablet Take 1 tablet (50 mg total) by mouth every 8 (eight) hours as needed. (Patient not taking: Reported on 06/21/2019)   No facility-administered encounter medications on file as of 06/21/2019.     Activities of Daily Living In your present state of health, do you have any difficulty performing the following activities: 06/21/2019 10/12/2018  Hearing? Y Y  Comment declines hearing aids -  Vision? Y Y  Comment macular degeneration -  Difficulty concentrating or making decisions? N Y  Walking or climbing stairs? Y N  Dressing or bathing? N N  Doing errands, shopping? Tempie Donning  Preparing Food  and eating ? N -  Using the  Toilet? N -  In the past six months, have you accidently leaked urine? N -  Do you have problems with loss of bowel control? N -  Managing your Medications? Y -  Comment daughter manages medication -  Managing your Finances? N -  Housekeeping or managing your Housekeeping? Y -  Comment help from daughter due to difficulty walking -  Some recent data might be hidden    Patient Care Team: [name], MD as PCP - General (Internal Medicine) Schnier, Dolores Lory, MD as Consulting Physician (Vascular Surgery) [name], MD as Consulting Physician (Endocrinology) [name], DPM as Consulting Physician (Podiatry) Corey Skains, MD as Consulting Physician (Cardiology) Jannet Mantis, MD as Consulting Physician (Dermatology) Deetta Perla, MD as Consulting Physician (Neurosurgery)    Assessment:   This is a routine wellness examination for [place].  Exercise Activities and Dietary recommendations Current Exercise Habits: The patient does not participate in regular exercise at present, Exercise limited by: neurologic condition(s);orthopedic condition(s)  Goals    . DIET - INCREASE WATER INTAKE     Recommend to drink at least 6-8 8oz glasses of water per day.       Fall Risk Fall Risk  06/21/2019 05/01/2019 12/26/2018 06/15/2018 03/07/2018  Falls in the past year? 1 1 1  No Yes  Number falls in past yr: 1 1 1  - 1  Injury with Fall? 0 1 0 - Yes  Comment - - - - head/neck hurt   Risk Factor Category  - - - - High Fall Risk  Risk for fall due to : Impaired balance/gait;Impaired mobility;Impaired vision Impaired balance/gait;Impaired mobility;History of fall(s) History of fall(s);Impaired balance/gait;Impaired mobility;Mental status change Impaired vision;History of fall(s);Other (Comment) -  Risk for fall due to: Comment - pinched nerve in spine - wears eyeglasses; rolled out of bed; joint pain -  Follow up Falls prevention discussed Falls evaluation completed Falls  evaluation completed - -   FALL RISK PREVENTION PERTAINING TO THE HOME:  Any stairs in or around the home? Yes  If so, do they handrails? Yes   Home free of loose throw rugs in walkways, pet beds, electrical cords, etc? Yes  Adequate lighting in your home to reduce risk of falls? Yes   ASSISTIVE DEVICES UTILIZED TO PREVENT FALLS:  Life alert? Yes  Use of a cane, walker or w/c? Yes  Grab bars in the bathroom? Yes  Shower chair or bench in shower? Yes  Elevated toilet seat or a handicapped toilet? Yes   DME ORDERS:  DME order needed?  No   TIMED UP AND GO:  Was the test performed? No .   Education: Fall risk prevention has been discussed.  Intervention(s) required? No   Depression Screen PHQ 2/9 Scores 06/21/2019 05/01/2019 12/26/2018 06/15/2018  PHQ - 2 Score 1 0 0 0  PHQ- 9 Score 3 1 - 0     Cognitive Function     6CIT Screen 06/21/2019 06/15/2018 06/02/2017  What Year? 0 points 0 points 0 points  What month? 0 points 0 points 0 points  What time? 0 points 0 points 0 points  Count back from 20 0 points 0 points 0 points  Months in reverse 0 points 0 points 0 points  Repeat phrase 4 points 2 points 0 points  Total Score 4 2 0    Immunization History  Administered Date(s) Administered  . Influenza, High Dose Seasonal PF 08/17/2017  .  Influenza, Seasonal, Injecte, Preservative Fre 09/04/2005, 08/10/2007, 08/27/2010  . Influenza,inj,Quad PF,6+ Mos 08/08/2014, 09/16/2015, 09/23/2016, 07/22/2018  . Influenza,inj,quad, With Preservative 08/16/2013  . Pneumococcal Conjugate-13 09/10/2014  . Pneumococcal Polysaccharide-23 10/29/2003  . Pneumococcal-Unspecified 09/04/2010  . Tdap 10/26/2008  . Varicella 05/07/2011  . Zoster 05/07/2011    Qualifies for Shingles Vaccine? Yes  Zostavax completed 2012. Due for Shingrix. Education has been provided regarding the importance of this vaccine. Pt has been advised to call insurance company to determine out of pocket expense. Advised  may also receive vaccine at local pharmacy or Health Dept. Verbalized acceptance and understanding.  Tdap: Although this vaccine is not a covered service during a Wellness Exam, does the patient still wish to receive this vaccine today?  No .  Education has been provided regarding the importance of this vaccine. Advised may receive this vaccine at local pharmacy or Health Dept. Aware to provide a copy of the vaccination record if obtained from local pharmacy or Health Dept. Verbalized acceptance and understanding.  Flu Vaccine: Up to date  Pneumococcal Vaccine: Up to date    Screening Tests Health Maintenance  Topic Date Due  . MAMMOGRAM  10/11/2018  . TETANUS/TDAP  10/26/2018  . INFLUENZA VACCINE  05/27/2019  . HEMOGLOBIN A1C  08/12/2019  . OPHTHALMOLOGY EXAM  08/17/2019  . FOOT EXAM  08/24/2019  . PNA vac Low Risk Adult  Completed  . DEXA SCAN  Addressed    Cancer Screenings:  Colorectal Screening: Done 08/12/18. No longer required.   Mammogram: Completed 10/11/17. Repeat every year. Ordered today. Pt provided with contact information and advised to call to schedule appt.   Bone Density: Completed 07/06/18. Results reflect  OSTEOPENIA. Repeat every 2 years.   Lung Cancer Screening: (Low Dose CT Chest recommended if Age 63-80 years, 30 pack-year currently smoking OR have quit w/in 15years.) does not qualify.    Additional Screening:  Hepatitis C Screening: no longer required  Vision Screening: Recommended annual ophthalmology exams for early detection of glaucoma and other disorders of the eye. Is the patient up to date with their annual eye exam?  Yes  Who is the provider or what is the name of the office in which the pt attends annual eye exams? Dr. Gaspar Bidding  Dental Screening: Recommended annual dental exams for proper oral hygiene  Community Resource Referral:  CRR required this visit?  No      Plan:     I have personally reviewed and addressed the Medicare Annual  Wellness questionnaire and have noted the following in the patient's chart:  A. Medical and social history B. Use of alcohol, tobacco or illicit drugs  C. Current medications and supplements D. Functional ability and status E.  Nutritional status F.  Physical activity G. Advance directives H. List of other physicians I.  Hospitalizations, surgeries, and ER visits in previous 12 months J.  [place] such as hearing and vision if needed, cognitive and depression L. Referrals and appointments   In addition, I have reviewed and discussed with patient certain preventive protocols, quality metrics, and best practice recommendations. A written personalized care plan for preventive services as well as general preventive health recommendations were provided to patient.   Signed,  Clemetine Marker, LPN Nurse Health Advisor   Nurse Notes: pt c/o numbness is left leg for the past 6 months due to pinched nerve

## 2019-06-26 DIAGNOSIS — H353221 Exudative age-related macular degeneration, left eye, with active choroidal neovascularization: Secondary | ICD-10-CM | POA: Diagnosis not present

## 2019-06-27 ENCOUNTER — Other Ambulatory Visit: Payer: Self-pay | Admitting: Internal Medicine

## 2019-06-27 ENCOUNTER — Telehealth: Payer: Self-pay

## 2019-06-27 DIAGNOSIS — E118 Type 2 diabetes mellitus with unspecified complications: Secondary | ICD-10-CM

## 2019-06-27 MED ORDER — BASAGLAR KWIKPEN 100 UNIT/ML ~~LOC~~ SOPN: 12.0000 [IU] | PEN_INJECTOR | Freq: Every day | SUBCUTANEOUS | 3 refills | Status: DC

## 2019-06-27 NOTE — Telephone Encounter (Signed)
Ptient needs Basaglar and [place] sent in Lantus which is way to high in copay. Pls call her if you can call this in once for her or reach [place].

## 2019-06-27 NOTE — Telephone Encounter (Signed)
Please let her know that I sent in [place] insulin to [facility] in [place].

## 2019-08-01 DIAGNOSIS — H353221 Exudative age-related macular degeneration, left eye, with active choroidal neovascularization: Secondary | ICD-10-CM | POA: Diagnosis not present

## 2019-08-01 DIAGNOSIS — H353211 Exudative age-related macular degeneration, right eye, with active choroidal neovascularization: Secondary | ICD-10-CM | POA: Diagnosis not present

## 2019-08-08 ENCOUNTER — Ambulatory Visit: Payer: Medicare Other

## 2019-08-30 ENCOUNTER — Other Ambulatory Visit: Payer: Self-pay | Admitting: Internal Medicine

## 2019-08-30 DIAGNOSIS — M5417 Radiculopathy, lumbosacral region: Secondary | ICD-10-CM

## 2019-09-04 ENCOUNTER — Ambulatory Visit: Payer: Medicare Other

## 2019-09-06 DIAGNOSIS — H353221 Exudative age-related macular degeneration, left eye, with active choroidal neovascularization: Secondary | ICD-10-CM | POA: Diagnosis not present

## 2019-09-06 DIAGNOSIS — Z961 Presence of intraocular lens: Secondary | ICD-10-CM | POA: Diagnosis not present

## 2019-09-06 LAB — HM DIABETES EYE EXAM

## 2019-09-14 ENCOUNTER — Other Ambulatory Visit: Payer: Self-pay

## 2019-09-14 ENCOUNTER — Ambulatory Visit (INDEPENDENT_AMBULATORY_CARE_PROVIDER_SITE_OTHER): Payer: Medicare Other | Admitting: Internal Medicine

## 2019-09-14 ENCOUNTER — Encounter: Payer: Self-pay | Admitting: Internal Medicine

## 2019-09-14 ENCOUNTER — Other Ambulatory Visit (INDEPENDENT_AMBULATORY_CARE_PROVIDER_SITE_OTHER): Payer: Self-pay | Admitting: Vascular Surgery

## 2019-09-14 VITALS — BP 114/66 | HR 65 | Ht 61.0 in | Wt 117.0 lb

## 2019-09-14 DIAGNOSIS — I6523 Occlusion and stenosis of bilateral carotid arteries: Secondary | ICD-10-CM | POA: Diagnosis not present

## 2019-09-14 DIAGNOSIS — N3000 Acute cystitis without hematuria: Secondary | ICD-10-CM

## 2019-09-14 DIAGNOSIS — Z23 Encounter for immunization: Secondary | ICD-10-CM

## 2019-09-14 DIAGNOSIS — L299 Pruritus, unspecified: Secondary | ICD-10-CM | POA: Diagnosis not present

## 2019-09-14 LAB — POC URINALYSIS WITH MICROSCOPIC (NON AUTO)MANUAL RESULT
Bilirubin, UA: NEGATIVE
Blood, UA: NEGATIVE
Crystals: 0
Epithelial cells, urine per micros: 2
Glucose, UA: NEGATIVE
Ketones, UA: NEGATIVE
Mucus, UA: 0
Nitrite, UA: NEGATIVE
Protein, UA: NEGATIVE
RBC: 0 M/uL — AB (ref 4.04–5.48)
Spec Grav, UA: 1.01 (ref 1.010–1.025)
Urobilinogen, UA: 0.2 E.U./dL
WBC Casts, UA: 5
pH, UA: 7.5 (ref 5.0–8.0)

## 2019-09-14 MED ORDER — CEFUROXIME AXETIL 250 MG PO TABS: 250.0000 mg | ORAL_TABLET | Freq: Two times a day (BID) | ORAL | 0 refills | Status: AC

## 2019-09-14 NOTE — Progress Notes (Signed)
Date:  09/14/2019   Name:  Jill Hudson   DOB:  November 11, 1934   MRN:  [id number]   Chief Complaint: Dysuria (Tingling and itching on vagina. No blood. ), Pruritis (Itching all over body. Says skin everywhere is itching. Unsure what is cause. Started 3-4 weeks ago. ), and Immunizations (High dose flu shot.)  Dysuria  This is a new (noticed that her BS were running higher than usual) problem. The current episode started in the past 7 days. The problem occurs every urination. Quality: tingling at the end of urination. The pain is mild. There has been no fever. Pertinent negatives include no chills, flank pain, hematuria, nausea or vomiting. She has tried nothing for the symptoms.   Pruritis - she has been having generalized itching for a month.  She is scratching constantly. No hives or rash.  No inciting event.  No medication changes. No changes in personal care products.  She has not tried anything for the itch.  She does use a moisturizer intermittently.  Lab Results  Component Value Date   CREATININE 1.17 (H) 12/26/2018   BUN 22 12/26/2018   NA 134 12/26/2018   K 4.4 12/26/2018   CL 98 12/26/2018   CO2 19 (L) 12/26/2018   Lab Results  Component Value Date   CHOL 139 12/26/2018   HDL 47 12/26/2018   LDLCALC 57 12/26/2018   TRIG 176 (H) 12/26/2018   CHOLHDL 3.0 12/26/2018   Lab Results  Component Value Date   TSH 3.750 12/26/2018   Lab Results  Component Value Date   HGBA1C 6.3 06/12/2019     Review of Systems  Constitutional: Negative for chills and fever.  Eyes: Negative for visual disturbance.  Respiratory: Negative for cough, chest tightness, shortness of breath and wheezing.   Cardiovascular: Positive for leg swelling. Negative for chest pain and palpitations.  Gastrointestinal: Negative for nausea and vomiting.  Genitourinary: Positive for dysuria. Negative for flank pain, hematuria, vaginal bleeding and vaginal discharge.  Skin: Positive for rash (and itching).   Neurological: Negative for dizziness, light-headedness and headaches.    Patient Active Problem List   Diagnosis Date Noted  . Age-related macular degeneration, dry, left eye 06/21/2019  . Age-related macular degeneration, wet, right eye ([place]) 06/21/2019  . Moderate nonproliferative diabetic retinopathy associated with type 2 diabetes mellitus ([place]) 06/21/2019  . Lumbosacral radiculopathy at L4 05/01/2019  . Underweight 05/01/2019  . Encounter for long-term (current) use of aspirin 10/31/2018  . Encounter for long-term (current) use of antiplatelets/antithrombotics 10/31/2018  . Long term current use of oral hypoglycemic drug 10/31/2018  . Encounter for long-term (current) use of insulin ([place]) 10/31/2018  . GIB (gastrointestinal bleeding) 08/11/2018  . Leg pain 07/03/2017  . Carpal tunnel syndrome on both sides 05/05/2017  . Myalgia due to HMG CoA reductase inhibitor 05/05/2017  . History of CVA (cerebrovascular accident) 02/15/2017  . Degenerative disc disease, lumbar 12/21/2016  . Atherosclerosis of native arteries of extremity with intermittent claudication ([place]) 12/20/2016  . Bilateral carotid artery stenosis 12/20/2016  . Hip bursitis 05/18/2016  . Elevated TSH 01/18/2016  . Chronic renal insufficiency, stage III (moderate) 01/16/2016  . Senile ecchymosis 01/16/2016  . Type II diabetes mellitus with complication ([place]) 20/25/4270  . GERD (gastroesophageal reflux disease) 07/24/2015  . PAD (peripheral artery disease) ([place]) 05/17/2015  . Neoplasm of uncertain behavior of skin 05/17/2015  . Retinopathy, diabetic, proliferative ([place]) 04/11/2015  . Mixed hyperlipidemia 04/11/2015  . Essential (primary) hypertension 04/11/2015  .  Generalized OA 04/11/2015  . Arteriosclerosis of coronary artery 05/29/2013  . Hypertensive heart disease without CHF 05/29/2013    Allergies  Allergen Reactions  . Saxagliptin Diarrhea  . Epinephrine Other (See Comments)    Patient does not remember  what happens when she uses this  . Atorvastatin Other (See Comments)    Muscle aches  . Codeine Other (See Comments)    Upset stomach  . Ezetimibe Other (See Comments)    Myalgias(ZETIA)  . Limonene Rash    Patient does not recall this reaction  . Nitrofurantoin Rash and Other (See Comments)    Pruitus  . Sulfa Antibiotics Rash and Other (See Comments)    Sore mouth     Past Surgical History:  Procedure Laterality Date  . CARDIAC CATHETERIZATION  1998   40% LM, 95% Ramus interm  . CATARACT EXTRACTION, BILATERAL    . COLONOSCOPY WITH PROPOFOL N/A 08/12/2018   Procedure: COLONOSCOPY WITH PROPOFOL;  Surgeon: Lucilla Lame, MD;  Location: [facility] ENDOSCOPY;  Service: Endoscopy;  Laterality: N/A;  . ENDARTERECTOMY FEMORAL Left 10/12/2018   Procedure: ENDARTERECTOMY FEMORAL;  Surgeon: Katha Cabal, MD;  Location: [facility] ORS;  Service: Vascular;  Laterality: Left;  angioplasty and left SFA stent placement  . EYE SURGERY Bilateral    cataract extractions  . LOWER EXTREMITY ANGIOGRAPHY Left 08/23/2017   Procedure: Lower Extremity Angiography;  Surgeon: Algernon Huxley, MD;  Location: [place] CV LAB;  Service: Cardiovascular;  Laterality: Left;  . LOWER EXTREMITY ANGIOGRAPHY Left 07/26/2018   Procedure: LOWER EXTREMITY ANGIOGRAPHY;  Surgeon: Katha Cabal, MD;  Location: [place] CV LAB;  Service: Cardiovascular;  Laterality: Left;  . LOWER EXTREMITY ANGIOGRAPHY Left 09/16/2018   Procedure: LOWER EXTREMITY ANGIOGRAPHY;  Surgeon: Katha Cabal, MD;  Location: [place] CV LAB;  Service: Cardiovascular;  Laterality: Left;  . PTCA  08/2013   Left common iliac  . PTCA  12/2012   left ext iliac  . TUBAL LIGATION      Social History   Tobacco Use  . Smoking status: Former Smoker    Packs/day: 2.00    Years: 37.00    Pack years: 74.00    Types: Cigarettes    Quit date: 1980    Years since quitting: 40.9  . Smokeless tobacco: Never Used  . Tobacco comment: smoking  cessation materials not required  Substance Use Topics  . Alcohol use: No    Alcohol/week: 0.0 standard drinks  . Drug use: No     Medication list has been reviewed and updated.  Current Meds  Medication Sig  . acetaminophen (TYLENOL) 650 MG CR tablet Take 1,300 mg by mouth every 8 (eight) hours as needed for pain.  Marland Kitchen amLODipine (NORVASC) 2.5 MG tablet Take 2.5 mg by mouth daily.  Marland Kitchen aspirin 81 MG tablet Take 81 mg by mouth daily.   . B-D ULTRAFINE III SHORT PEN 31G X 8 MM MISC USE AS DIRECTED  . baclofen (LIORESAL) 10 MG tablet TAKE 1 TABLET(10 MG) BY MOUTH THREE TIMES DAILY (Patient taking differently: Take 10 mg by mouth at bedtime. )  . chlorthalidone (HYGROTON) 25 MG tablet Take 25 mg by mouth daily.  . clopidogrel (PLAVIX) 75 MG tablet TAKE 1 TABLET BY MOUTH EVERY DAY  . Cyanocobalamin (RA VITAMIN B-12 TR) 1000 MCG TBCR Take 1,000 mcg by mouth daily.   Marland Kitchen diltiazem (CARTIA XT) 180 MG 24 hr capsule Take 180 mg by mouth 2 (two) times daily.   Marland Kitchen  ferrous sulfate 325 (65 FE) MG tablet Take 325 mg by mouth daily with breakfast.  . gabapentin (NEURONTIN) 100 MG capsule TAKE 1 TO 2 CAPSULES(100 TO 200 MG) BY MOUTH AT BEDTIME  . insulin aspart (NOVOLOG FLEXPEN) 100 UNIT/ML FlexPen Inject 3 Units into the skin 3 (three) times daily with meals. At lunch (Patient taking differently: Inject 2 Units into the skin 3 (three) times daily with meals. At breakfast)  . Insulin Glargine (BASAGLAR KWIKPEN) 100 UNIT/ML SOPN Inject 0.12 mLs (12 Units total) into the skin daily.  Marland Kitchen losartan (COZAAR) 50 MG tablet Take 50 mg by mouth 2 (two) times daily.   . metFORMIN (GLUCOPHAGE-XR) 500 MG 24 hr tablet TAKE 1 TABLET BY MOUTH TWICE DAILY  . Multiple Vitamins-Minerals (PRESERVISION AREDS PO) Take 1 capsule by mouth 2 (two) times daily.   . nitroGLYCERIN (NITROSTAT) 0.4 MG SL tablet 1 tab under the tongue for CP, may repeat in 5 minutes up to 3 doses  . rosuvastatin (CRESTOR) 5 MG tablet Take 5 mg by mouth every  other day.   . traMADol (ULTRAM) 50 MG tablet Take 1 tablet (50 mg total) by mouth every 8 (eight) hours as needed.    PHQ 2/9 Scores 09/14/2019 06/21/2019 05/01/2019 12/26/2018  PHQ - 2 Score 4 1 0 0  PHQ- 9 Score 5 3 1  -    BP Readings from Last 3 Encounters:  09/14/19 114/66  06/21/19 (!) 148/54  05/29/19 (!) 179/64    Physical Exam Vitals signs and nursing note reviewed.  Constitutional:      General: She is not in acute distress (but constantly scratching).    Appearance: She is well-developed.  Neck:     Musculoskeletal: Normal range of motion.  Cardiovascular:     Rate and Rhythm: Normal rate and regular rhythm.     Pulses: Normal pulses.     Heart sounds: Normal heart sounds.  Pulmonary:     Effort: Pulmonary effort is normal. No respiratory distress.     Breath sounds: Normal breath sounds. No wheezing or rhonchi.  Abdominal:     General: Bowel sounds are normal.     Palpations: Abdomen is soft.     Tenderness: There is abdominal tenderness in the suprapubic area. There is no guarding or rebound.  Musculoskeletal:     Right lower leg: No edema.     Left lower leg: Edema present.  Lymphadenopathy:     Cervical: No cervical adenopathy.  Skin:    General: Skin is warm and dry.     Capillary Refill: Capillary refill takes less than 2 seconds.     Findings: No erythema (some areas of excoriation over arms) or rash.  Neurological:     General: No focal deficit present.     Mental Status: She is alert.  Psychiatric:        Mood and Affect: Mood normal.     Wt Readings from Last 3 Encounters:  09/14/19 117 lb (53.1 kg)  06/21/19 116 lb (52.6 kg)  05/29/19 116 lb (52.6 kg)    BP 114/66   Pulse 65   Ht 5\' 1"  (1.549 m)   Wt 117 lb (53.1 kg)   SpO2 99%   BMI 22.11 kg/m   Assessment and Plan: 1. Acute cystitis without hematuria Increase fluids intake Follow up if persistent for culture (QNS today) - POC urinalysis w microscopic (non auto) - cefUROXime  (CEFTIN) 250 MG tablet; Take 1 tablet (250 mg total) by mouth 2 (  two) times daily with a meal for 7 days.  Dispense: 14 tablet; Refill: 0  2. Pruritic condition Suspect this is xerosis Continue moisturizers as needed Allegra or Claritin daily to reduce symptoms  3. Need for immunization against influenza - Flu Vaccine QUAD High Dose(Fluad)   Partially dictated using Editor, commissioning. Any errors are unintentional.  [name], MD [facility]  09/14/2019

## 2019-09-14 NOTE — Patient Instructions (Addendum)
Get some Claritin (loratdine) or Allegra (fexofenadine) and take one daily to reduce itching.

## 2019-09-17 ENCOUNTER — Encounter: Payer: Self-pay | Admitting: Internal Medicine

## 2019-09-18 DIAGNOSIS — H353221 Exudative age-related macular degeneration, left eye, with active choroidal neovascularization: Secondary | ICD-10-CM | POA: Diagnosis not present

## 2019-09-18 DIAGNOSIS — H353211 Exudative age-related macular degeneration, right eye, with active choroidal neovascularization: Secondary | ICD-10-CM | POA: Diagnosis not present

## 2019-10-02 ENCOUNTER — Telehealth: Payer: Self-pay

## 2019-10-02 ENCOUNTER — Other Ambulatory Visit: Payer: Self-pay

## 2019-10-02 ENCOUNTER — Encounter: Payer: Self-pay | Admitting: Internal Medicine

## 2019-10-02 ENCOUNTER — Ambulatory Visit (INDEPENDENT_AMBULATORY_CARE_PROVIDER_SITE_OTHER): Payer: Medicare Other | Admitting: Internal Medicine

## 2019-10-02 VITALS — BP 130/72 | HR 71 | Ht 62.0 in | Wt 116.0 lb

## 2019-10-02 DIAGNOSIS — I6523 Occlusion and stenosis of bilateral carotid arteries: Secondary | ICD-10-CM

## 2019-10-02 DIAGNOSIS — N762 Acute vulvitis: Secondary | ICD-10-CM

## 2019-10-02 DIAGNOSIS — N760 Acute vaginitis: Secondary | ICD-10-CM | POA: Diagnosis not present

## 2019-10-02 LAB — POCT WET PREP WITH KOH
KOH Prep POC: NEGATIVE
Trichomonas, UA: NEGATIVE
Yeast Wet Prep HPF POC: NEGATIVE

## 2019-10-02 MED ORDER — DOXYCYCLINE HYCLATE 100 MG PO TABS: 100.0000 mg | ORAL_TABLET | Freq: Two times a day (BID) | ORAL | 0 refills | Status: AC

## 2019-10-02 MED ORDER — CLINDAMYCIN PHOSPHATE 2 % VA CREA: 1.0000 | TOPICAL_CREAM | Freq: Every day | VAGINAL | 0 refills | Status: DC

## 2019-10-02 NOTE — Telephone Encounter (Signed)
Is she taking Claritin or Allegra every day?

## 2019-10-02 NOTE — Telephone Encounter (Signed)
Pt called saying she is started to having itching all over her body again. Says its very bothersome and asking what should we do or should she come in to be seen?  Please advise.

## 2019-10-02 NOTE — Progress Notes (Signed)
Date:  10/02/2019   Name:  Jill Hudson   DOB:  08-26-1935   MRN:  [id number]   Chief Complaint: Vaginal Itching (Started over the weekend- tried vagisil cream and helped only a little. )  Vaginal Itching The patient's primary symptoms include genital itching and vaginal discharge. The patient's pertinent negatives include no genital rash, pelvic pain or vaginal bleeding. This is a new problem. The current episode started yesterday. The problem occurs constantly. The problem has been unchanged. Associated symptoms include diarrhea. Pertinent negatives include no abdominal pain, chills, dysuria or fever. Treatments tried: Vagisil cream.    Lab Results  Component Value Date   CREATININE 1.17 (H) 12/26/2018   BUN 22 12/26/2018   NA 134 12/26/2018   K 4.4 12/26/2018   CL 98 12/26/2018   CO2 19 (L) 12/26/2018   Lab Results  Component Value Date   CHOL 139 12/26/2018   HDL 47 12/26/2018   LDLCALC 57 12/26/2018   TRIG 176 (H) 12/26/2018   CHOLHDL 3.0 12/26/2018   Lab Results  Component Value Date   TSH 3.750 12/26/2018   Lab Results  Component Value Date   HGBA1C 6.3 06/12/2019     Review of Systems  Constitutional: Negative for chills, diaphoresis and fever.  Respiratory: Negative for chest tightness and shortness of breath.   Cardiovascular: Negative for chest pain.  Gastrointestinal: Positive for diarrhea. Negative for abdominal pain.  Genitourinary: Positive for genital sores and vaginal discharge. Negative for difficulty urinating, dysuria, pelvic pain and vaginal bleeding.    Patient Active Problem List   Diagnosis Date Noted  . Age-related macular degeneration, dry, left eye 06/21/2019  . Age-related macular degeneration, wet, right eye ([place]) 06/21/2019  . Moderate nonproliferative diabetic retinopathy associated with type 2 diabetes mellitus ([place]) 06/21/2019  . Lumbosacral radiculopathy at L4 05/01/2019  . Underweight 05/01/2019  . Encounter for long-term  (current) use of aspirin 10/31/2018  . Encounter for long-term (current) use of antiplatelets/antithrombotics 10/31/2018  . Long term current use of oral hypoglycemic drug 10/31/2018  . Encounter for long-term (current) use of insulin ([place]) 10/31/2018  . GIB (gastrointestinal bleeding) 08/11/2018  . Leg pain 07/03/2017  . Carpal tunnel syndrome on both sides 05/05/2017  . Myalgia due to HMG CoA reductase inhibitor 05/05/2017  . History of CVA (cerebrovascular accident) 02/15/2017  . Degenerative disc disease, lumbar 12/21/2016  . Atherosclerosis of native arteries of extremity with intermittent claudication ([place]) 12/20/2016  . Bilateral carotid artery stenosis 12/20/2016  . Hip bursitis 05/18/2016  . Elevated TSH 01/18/2016  . Chronic renal insufficiency, stage III (moderate) 01/16/2016  . Senile ecchymosis 01/16/2016  . Type II diabetes mellitus with complication ([place]) 40/98/1191  . GERD (gastroesophageal reflux disease) 07/24/2015  . PAD (peripheral artery disease) ([place]) 05/17/2015  . Neoplasm of uncertain behavior of skin 05/17/2015  . Retinopathy, diabetic, proliferative ([place]) 04/11/2015  . Mixed hyperlipidemia 04/11/2015  . Essential (primary) hypertension 04/11/2015  . Generalized OA 04/11/2015  . Arteriosclerosis of coronary artery 05/29/2013  . Hypertensive heart disease without CHF 05/29/2013    Allergies  Allergen Reactions  . Saxagliptin Diarrhea  . Epinephrine Other (See Comments)    Patient does not remember what happens when she uses this  . Atorvastatin Other (See Comments)    Muscle aches  . Codeine Other (See Comments)    Upset stomach  . Ezetimibe Other (See Comments)    Myalgias(ZETIA)  . Limonene Rash    Patient does not recall this reaction  .  Nitrofurantoin Rash and Other (See Comments)    Pruitus  . Sulfa Antibiotics Rash and Other (See Comments)    Sore mouth     Past Surgical History:  Procedure Laterality Date  . CARDIAC CATHETERIZATION  1998    40% LM, 95% Ramus interm  . CATARACT EXTRACTION, BILATERAL    . COLONOSCOPY WITH PROPOFOL N/A 08/12/2018   Procedure: COLONOSCOPY WITH PROPOFOL;  Surgeon: Lucilla Lame, MD;  Location[facility] ENDOSCOPY;  Service: Endoscopy;  Laterality: N/A;  . ENDARTERECTOMY FEMORAL Left 10/12/2018   Procedure: ENDARTERECTOMY FEMORAL;  Surgeon: Katha Cabal, MD;  Location: [facility] ORS;  Service: Vascular;  Laterality: Left;  angioplasty and left SFA stent placement  . EYE SURGERY Bilateral    cataract extractions  . LOWER EXTREMITY ANGIOGRAPHY Left 08/23/2017   Procedure: Lower Extremity Angiography;  Surgeon: Algernon Huxley, MD;  Location: [place] CV LAB;  Service: Cardiovascular;  Laterality: Left;  . LOWER EXTREMITY ANGIOGRAPHY Left 07/26/2018   Procedure: LOWER EXTREMITY ANGIOGRAPHY;  Surgeon: Katha Cabal, MD;  Location: [place] CV LAB;  Service: Cardiovascular;  Laterality: Left;  . LOWER EXTREMITY ANGIOGRAPHY Left 09/16/2018   Procedure: LOWER EXTREMITY ANGIOGRAPHY;  Surgeon: Katha Cabal, MD;  Location: [place] CV LAB;  Service: Cardiovascular;  Laterality: Left;  . PTCA  08/2013   Left common iliac  . PTCA  12/2012   left ext iliac  . TUBAL LIGATION      Social History   Tobacco Use  . Smoking status: Former Smoker    Packs/day: 2.00    Years: 37.00    Pack years: 74.00    Types: Cigarettes    Quit date: 1980    Years since quitting: 40.9  . Smokeless tobacco: Never Used  . Tobacco comment: smoking cessation materials not required  Substance Use Topics  . Alcohol use: No    Alcohol/week: 0.0 standard drinks  . Drug use: No     Medication list has been reviewed and updated.  Current Meds  Medication Sig  . acetaminophen (TYLENOL) 650 MG CR tablet Take 1,300 mg by mouth every 8 (eight) hours as needed for pain.  Marland Kitchen amLODipine (NORVASC) 2.5 MG tablet Take 2.5 mg by mouth daily.  Marland Kitchen aspirin 81 MG tablet Take 81 mg by mouth daily.   . B-D ULTRAFINE III  SHORT PEN 31G X 8 MM MISC USE AS DIRECTED  . baclofen (LIORESAL) 10 MG tablet TAKE 1 TABLET(10 MG) BY MOUTH THREE TIMES DAILY (Patient taking differently: Take 10 mg by mouth at bedtime. )  . chlorthalidone (HYGROTON) 25 MG tablet Take 25 mg by mouth daily.  . clopidogrel (PLAVIX) 75 MG tablet TAKE 1 TABLET BY MOUTH EVERY DAY  . Cyanocobalamin (RA VITAMIN B-12 TR) 1000 MCG TBCR Take 1,000 mcg by mouth daily.   Marland Kitchen diltiazem (CARTIA XT) 180 MG 24 hr capsule Take 180 mg by mouth 2 (two) times daily.   . ferrous sulfate 325 (65 FE) MG tablet Take 325 mg by mouth daily with breakfast.  . gabapentin (NEURONTIN) 100 MG capsule TAKE 1 TO 2 CAPSULES(100 TO 200 MG) BY MOUTH AT BEDTIME  . insulin aspart (NOVOLOG FLEXPEN) 100 UNIT/ML FlexPen Inject 3 Units into the skin 3 (three) times daily with meals. At lunch (Patient taking differently: Inject 2 Units into the skin 3 (three) times daily with meals. At breakfast)  . Insulin Glargine (BASAGLAR KWIKPEN) 100 UNIT/ML SOPN Inject 0.12 mLs (12 Units total) into the skin daily.  Marland Kitchen  losartan (COZAAR) 50 MG tablet Take 50 mg by mouth 2 (two) times daily.   . metFORMIN (GLUCOPHAGE-XR) 500 MG 24 hr tablet TAKE 1 TABLET BY MOUTH TWICE DAILY  . Multiple Vitamins-Minerals (PRESERVISION AREDS PO) Take 1 capsule by mouth 2 (two) times daily.   . nitroGLYCERIN (NITROSTAT) 0.4 MG SL tablet 1 tab under the tongue for CP, may repeat in 5 minutes up to 3 doses  . rosuvastatin (CRESTOR) 5 MG tablet Take 5 mg by mouth every other day.   . traMADol (ULTRAM) 50 MG tablet Take 1 tablet (50 mg total) by mouth every 8 (eight) hours as needed.    PHQ 2/9 Scores 10/02/2019 09/14/2019 06/21/2019 05/01/2019  PHQ - 2 Score 2 4 1  0  PHQ- 9 Score 3 5 3 1     BP Readings from Last 3 Encounters:  10/02/19 130/72  09/14/19 114/66  06/21/19 (!) 148/54    Physical Exam Vitals signs and nursing note reviewed.  Constitutional:      General: She is not in acute distress.    Appearance: She  is well-developed.  HENT:     Head: Normocephalic and atraumatic.  Pulmonary:     Effort: Pulmonary effort is normal. No respiratory distress.  Genitourinary:    Labia:        Right: Tenderness present.        Left: Tenderness present.      Comments: Labia swollen, erythematous without ulceration White thin discharge noted Skin on the outer labia and mons scaly, reddened and pruritic Vaginal swab with minimal bacteria and RBCs Musculoskeletal: Normal range of motion.  Skin:    General: Skin is warm and dry.     Findings: No rash.  Neurological:     Mental Status: She is alert and oriented to person, place, and time.  Psychiatric:        Behavior: Behavior normal.        Thought Content: Thought content normal.     Wt Readings from Last 3 Encounters:  10/02/19 116 lb (52.6 kg)  09/14/19 117 lb (53.1 kg)  06/21/19 116 lb (52.6 kg)    BP 130/72   Pulse 71   Ht 5\' 2"  (1.575 m)   Wt 116 lb (52.6 kg)   SpO2 98%   BMI 21.22 kg/m   Assessment and Plan: 1. Acute vaginitis Uncertain cause but very inflamed so will try cleocin cream topically - POCT Wet Prep with KOH - clindamycin (CLEOCIN) 2 % vaginal cream; Place 1 Applicatorful vaginally at bedtime.  Dispense: 40 g; Refill: 0  2. Cellulitis of labia Recommend Claritin or Allegra as well for itching - doxycycline (VIBRA-TABS) 100 MG tablet; Take 1 tablet (100 mg total) by mouth 2 (two) times daily for 10 days.  Dispense: 20 tablet; Refill: 0   Partially dictated using Editor, commissioning. Any errors are unintentional.  [name], MD [facility]  10/02/2019

## 2019-10-02 NOTE — Patient Instructions (Signed)
Also take claritin or Allegra once a day for itching

## 2019-10-02 NOTE — Telephone Encounter (Signed)
Pt also having vaginal itching. Is not taking Claritin or allegra. Will come in today to be seen for itching.

## 2019-10-16 LAB — HEMOGLOBIN A1C: Hemoglobin A1C: 6.5

## 2019-10-18 ENCOUNTER — Encounter: Payer: Self-pay | Admitting: Internal Medicine

## 2019-11-06 DIAGNOSIS — H353221 Exudative age-related macular degeneration, left eye, with active choroidal neovascularization: Secondary | ICD-10-CM | POA: Diagnosis not present

## 2019-11-06 DIAGNOSIS — H353211 Exudative age-related macular degeneration, right eye, with active choroidal neovascularization: Secondary | ICD-10-CM | POA: Diagnosis not present

## 2019-11-30 ENCOUNTER — Ambulatory Visit (INDEPENDENT_AMBULATORY_CARE_PROVIDER_SITE_OTHER): Payer: Medicare Other

## 2019-11-30 ENCOUNTER — Other Ambulatory Visit: Payer: Self-pay

## 2019-11-30 ENCOUNTER — Ambulatory Visit (INDEPENDENT_AMBULATORY_CARE_PROVIDER_SITE_OTHER): Payer: Medicare Other | Admitting: Vascular Surgery

## 2019-11-30 ENCOUNTER — Encounter (INDEPENDENT_AMBULATORY_CARE_PROVIDER_SITE_OTHER): Payer: Self-pay | Admitting: Vascular Surgery

## 2019-11-30 VITALS — BP 183/61 | HR 72 | Resp 16 | Wt 117.0 lb

## 2019-11-30 DIAGNOSIS — I739 Peripheral vascular disease, unspecified: Secondary | ICD-10-CM

## 2019-11-30 DIAGNOSIS — E118 Type 2 diabetes mellitus with unspecified complications: Secondary | ICD-10-CM | POA: Diagnosis not present

## 2019-11-30 DIAGNOSIS — I6523 Occlusion and stenosis of bilateral carotid arteries: Secondary | ICD-10-CM | POA: Diagnosis not present

## 2019-11-30 DIAGNOSIS — I70212 Atherosclerosis of native arteries of extremities with intermittent claudication, left leg: Secondary | ICD-10-CM | POA: Diagnosis not present

## 2019-11-30 DIAGNOSIS — I10 Essential (primary) hypertension: Secondary | ICD-10-CM

## 2019-11-30 DIAGNOSIS — K219 Gastro-esophageal reflux disease without esophagitis: Secondary | ICD-10-CM

## 2019-11-30 DIAGNOSIS — E782 Mixed hyperlipidemia: Secondary | ICD-10-CM | POA: Diagnosis not present

## 2019-12-06 ENCOUNTER — Encounter (INDEPENDENT_AMBULATORY_CARE_PROVIDER_SITE_OTHER): Payer: Self-pay | Admitting: Vascular Surgery

## 2019-12-06 NOTE — Progress Notes (Signed)
MRN : [id number]  Jill Hudson is a 85 y.o. (May 06, 1935) female who presents with chief complaint of  Chief Complaint  Patient presents with  . Follow-up    ultrasound follow up  .  History of Present Illness:   The patient returns to the office for followup and review of the noninvasive studies. There have been no interval changes in lower extremity symptoms. No interval shortening of the patient's claudication distance or development of rest pain symptoms. No new ulcers or wounds have occurred since the last visit.  There have been no significant changes to the patient's overall health care.  The patient denies amaurosis fugax or recent TIA symptoms. There are no recent neurological changes noted. The patient denies history of DVT, PE or superficial thrombophlebitis. The patient denies recent episodes of angina or shortness of breath.   ABI Rt=[place] and Lt=[place]; TBI's Rt=0.96 and LT=0.77  (previous ABI's Rt=[place] and Lt=[place];  TBI's Rt=0.78 and Lt=0.81) Duplex ultrasound of the carotid arteries demonstrates 40 to 59% diameter stenosis bilateral internal carotid arteries  Current Meds  Medication Sig  . acetaminophen (TYLENOL) 650 MG CR tablet Take 1,300 mg by mouth every 8 (eight) hours as needed for pain.  Marland Kitchen amLODipine (NORVASC) 2.5 MG tablet Take 2.5 mg by mouth daily.  Marland Kitchen aspirin 81 MG tablet Take 81 mg by mouth daily.   . B-D ULTRAFINE III SHORT PEN 31G X 8 MM MISC USE AS DIRECTED  . baclofen (LIORESAL) 10 MG tablet TAKE 1 TABLET(10 MG) BY MOUTH THREE TIMES DAILY (Patient taking differently: Take 10 mg by mouth at bedtime. )  . chlorthalidone (HYGROTON) 25 MG tablet Take 25 mg by mouth daily.  . clindamycin (CLEOCIN) 2 % vaginal cream Place 1 Applicatorful vaginally at bedtime.  . clopidogrel (PLAVIX) 75 MG tablet TAKE 1 TABLET BY MOUTH EVERY DAY  . Cyanocobalamin (RA VITAMIN B-12 TR) 1000 MCG TBCR Take 1,000 mcg by mouth daily.   Marland Kitchen diltiazem (CARTIA XT) 180 MG 24 hr capsule Take 180  mg by mouth 2 (two) times daily.   . ferrous sulfate 325 (65 FE) MG tablet Take 325 mg by mouth daily with breakfast.  . gabapentin (NEURONTIN) 100 MG capsule TAKE 1 TO 2 CAPSULES(100 TO 200 MG) BY MOUTH AT BEDTIME  . insulin aspart (NOVOLOG FLEXPEN) 100 UNIT/ML FlexPen Inject 3 Units into the skin 3 (three) times daily with meals. At lunch (Patient taking differently: Inject 2 Units into the skin 3 (three) times daily with meals. At breakfast)  . Insulin Glargine (BASAGLAR KWIKPEN) 100 UNIT/ML SOPN Inject 0.12 mLs (12 Units total) into the skin daily.  Marland Kitchen losartan (COZAAR) 50 MG tablet Take 50 mg by mouth 2 (two) times daily.   . metFORMIN (GLUCOPHAGE-XR) 500 MG 24 hr tablet TAKE 1 TABLET BY MOUTH TWICE DAILY  . Multiple Vitamins-Minerals (PRESERVISION AREDS PO) Take 1 capsule by mouth 2 (two) times daily.   . nitroGLYCERIN (NITROSTAT) 0.4 MG SL tablet 1 tab under the tongue for CP, may repeat in 5 minutes up to 3 doses  . rosuvastatin (CRESTOR) 5 MG tablet Take 5 mg by mouth every other day.     Past Medical History:  Diagnosis Date  . Allergies   . Anxiety   . Arthritis    spine and shoulder  . Atherosclerosis of artery of extremity with rest pain ([place]) 10/12/2018  . Cancer ([place])    skin  . Diabetes mellitus without complication ([place])   . Heart murmur   .  Hyperlipidemia   . Hypertension   . Macula lutea degeneration   . Mitral and aortic valve disease   . Myocardial infarction [facility])    may have had a "light" heart attack  . Occasional tremors   . PAD (peripheral artery disease) ([place])   . Shingles    patient unaware but daughter confirms. it was a long time ago  . Stroke [facility]) 01/2017   may have had a slight stroke  . TIA (transient ischemic attack) 01/2017  . UTI (urinary tract infection)   . Vascular disease, peripheral [facility])     Past Surgical History:  Procedure Laterality Date  . CARDIAC CATHETERIZATION  1998   40% LM, 95% Ramus interm  . CATARACT EXTRACTION,  BILATERAL    . COLONOSCOPY WITH PROPOFOL N/A 08/12/2018   Procedure: COLONOSCOPY WITH PROPOFOL;  Surgeon: Lucilla Lame, MD;  Location: [facility] ENDOSCOPY;  Service: Endoscopy;  Laterality: N/A;  . ENDARTERECTOMY FEMORAL Left 10/12/2018   Procedure: ENDARTERECTOMY FEMORAL;  Surgeon: Katha Cabal, MD;  Location: [facility] ORS;  Service: Vascular;  Laterality: Left;  angioplasty and left SFA stent placement  . EYE SURGERY Bilateral    cataract extractions  . LOWER EXTREMITY ANGIOGRAPHY Left 08/23/2017   Procedure: Lower Extremity Angiography;  Surgeon: Algernon Huxley, MD;  Location: [place] CV LAB;  Service: Cardiovascular;  Laterality: Left;  . LOWER EXTREMITY ANGIOGRAPHY Left 07/26/2018   Procedure: LOWER EXTREMITY ANGIOGRAPHY;  Surgeon: Katha Cabal, MD;  Location: [place] CV LAB;  Service: Cardiovascular;  Laterality: Left;  . LOWER EXTREMITY ANGIOGRAPHY Left 09/16/2018   Procedure: LOWER EXTREMITY ANGIOGRAPHY;  Surgeon: Katha Cabal, MD;  Location: [place] CV LAB;  Service: Cardiovascular;  Laterality: Left;  . PTCA  08/2013   Left common iliac  . PTCA  12/2012   left ext iliac  . TUBAL LIGATION      Social History Social History   Tobacco Use  . Smoking status: Former Smoker    Packs/day: 2.00    Years: 37.00    Pack years: 74.00    Types: Cigarettes    Quit date: 1980    Years since quitting: 41.1  . Smokeless tobacco: Never Used  . Tobacco comment: smoking cessation materials not required  Substance Use Topics  . Alcohol use: No    Alcohol/week: 0.0 standard drinks  . Drug use: No    Family History Family History  Problem Relation Age of Onset  . Dementia Mother   . Diabetes Father     Allergies  Allergen Reactions  . Saxagliptin Diarrhea  . Epinephrine Other (See Comments)    Patient does not remember what happens when she uses this  . Atorvastatin Other (See Comments)    Muscle aches  . Codeine Other (See Comments)    Upset stomach    . Ezetimibe Other (See Comments)    Myalgias(ZETIA)  . Limonene Rash    Patient does not recall this reaction  . Nitrofurantoin Rash and Other (See Comments)    Pruitus  . Sulfa Antibiotics Rash and Other (See Comments)    Sore mouth      REVIEW OF SYSTEMS (Negative unless checked)  Constitutional: [] Weight loss  [] Fever  [] Chills Cardiac: [] Chest pain   [] Chest pressure   [] Palpitations   [] Shortness of breath when laying flat   [] Shortness of breath with exertion. Vascular:  [] Pain in legs with walking   [] Pain in legs at rest  [] History of DVT   [] Phlebitis   []   Swelling in legs   [] Varicose veins   [] Non-healing ulcers Pulmonary:   [] Uses home oxygen   [] Productive cough   [] Hemoptysis   [] Wheeze  [] COPD   [] Asthma Neurologic:  [] Dizziness   [] Seizures   [] History of stroke   [] History of TIA  [] Aphasia   [] Vissual changes   [] Weakness or numbness in arm   [] Weakness or numbness in leg Musculoskeletal:   [] Joint swelling   [] Joint pain   [] Low back pain Hematologic:  [] Easy bruising  [] Easy bleeding   [] Hypercoagulable state   [] Anemic Gastrointestinal:  [] Diarrhea   [] Vomiting  [] Gastroesophageal reflux/heartburn   [] Difficulty swallowing. Genitourinary:  [] Chronic kidney disease   [] Difficult urination  [] Frequent urination   [] Blood in urine Skin:  [] Rashes   [] Ulcers  Psychological:  [] History of anxiety   []  History of major depression.  Physical Examination  Vitals:   11/30/19 1528  BP: (!) 183/61  Pulse: 72  Resp: 16  Weight: 117 lb (53.1 kg)   Body mass index is 21.4 kg/m. Gen: WD/WN, NAD Head: [place]/AT, No temporalis wasting.  Ear/Nose/Throat: Hearing grossly intact, nares w/o erythema or drainage Eyes: PER, EOMI, sclera nonicteric.  Neck: Supple, no large masses.   Pulmonary:  Good air movement, no audible wheezing bilaterally, no use of accessory muscles.  Cardiac: RRR, no JVD Vascular:  Vessel Right Left  Radial Palpable Palpable  PT Trace Palpable Trace  Palpable  DP Trace Palpable Trace Palpable  Gastrointestinal: Non-distended. No guarding/no peritoneal signs.  Musculoskeletal: M/S 5/5 throughout.  No deformity or atrophy.  Neurologic: CN 2-12 intact. Symmetrical.  Speech is fluent. Motor exam as listed above. Psychiatric: Judgment intact, Mood & affect appropriate for pt's clinical situation. Dermatologic: No rashes or ulcers noted.  No changes consistent with cellulitis. Lymph : No lichenification or skin changes of chronic lymphedema.  CBC Lab Results  Component Value Date   WBC 7.1 11/02/2018   HGB 9.7 (L) 11/02/2018   HCT 31.8 (L) 11/02/2018   MCV 91.1 11/02/2018   PLT 345 11/02/2018    BMET    Component Value Date/Time   NA 134 12/26/2018 1612   NA 138 05/29/2014 1202   K 4.4 12/26/2018 1612   K 5.1 05/29/2014 1202   CL 98 12/26/2018 1612   CL 106 05/29/2014 1202   CO2 19 (L) 12/26/2018 1612   CO2 25 05/29/2014 1202   GLUCOSE 141 (H) 12/26/2018 1612   GLUCOSE 219 (H) 11/02/2018 1557   GLUCOSE 269 (H) 05/29/2014 1202   BUN 22 12/26/2018 1612   BUN 19 (H) 05/29/2014 1202   CREATININE 1.17 (H) 12/26/2018 1612   CREATININE 1.10 05/29/2014 1202   CALCIUM 9.7 12/26/2018 1612   CALCIUM 9.1 05/29/2014 1202   GFRNONAA 43 (L) 12/26/2018 1612   GFRNONAA 48 (L) 05/29/2014 1202   GFRAA 50 (L) 12/26/2018 1612   GFRAA 56 (L) 05/29/2014 1202   CrCl cannot be calculated (Patient's most recent lab result is older than the maximum 21 days allowed.).  COAG Lab Results  Component Value Date   INR 1.08 11/02/2018   INR 1.09 10/06/2018   INR 1.08 08/11/2018    Radiology VAS [place] ABI WITH/WO TBI  Result Date: 11/30/2019 LOWER EXTREMITY DOPPLER STUDY Indications: Peripheral artery disease, and CTA 09/06/2018              10/12/2018 2 stents to left SFA-pop. High Risk Factors: Hypertension. Other Factors: Known left SFA occlusion per previous duplex on 02/06/16 &  angiogram.  Vascular Interventions: 12/28/12-Left EIA &  SFA stents;                         09/08/13-Left CIA stent & left CFA PTA;                         05/29/14-Left SFA PTA;                         08/23/17-Right CIA & left CFA PTAs. Comparison Study: 05/29/2019 Performing Technologist: Charlane Ferretti RT (R)(VS)  Examination Guidelines: A complete evaluation includes at minimum, Doppler waveform signals and systolic blood pressure reading at the level of bilateral brachial, anterior tibial, and posterior tibial arteries, when vessel segments are accessible. Bilateral testing is considered an integral part of a complete examination. Photoelectric Plethysmograph (PPG) waveforms and toe systolic pressure readings are included as required and additional duplex testing as needed. Limited examinations for reoccurring indications may be performed as noted.  ABI Findings: +---------+------------------+-----+---------+----------------+ Right    Rt Pressure (mmHg)IndexWaveform Comment          +---------+------------------+-----+---------+----------------+ Brachial 185                                              +---------+------------------+-----+---------+----------------+ ATA                             triphasicNon compressable +---------+------------------+-----+---------+----------------+ PTA      220               1.15 triphasic                 +---------+------------------+-----+---------+----------------+ Great Toe185               0.96 Normal                    +---------+------------------+-----+---------+----------------+ +---------+------------------+-----+---------+----------------+ Left     Lt Pressure (mmHg)IndexWaveform Comment          +---------+------------------+-----+---------+----------------+ Brachial 192                                              +---------+------------------+-----+---------+----------------+ ATA      199               1.04 triphasic                  +---------+------------------+-----+---------+----------------+ PTA                             triphasicNon compressable +---------+------------------+-----+---------+----------------+ Great Toe147               0.77 Normal                    +---------+------------------+-----+---------+----------------+ +-------+-----------+-----------+------------+------------+ ABI/TBIToday's ABIToday's TBIPrevious ABIPrevious TBI +-------+-----------+-----------+------------+------------+ Right  [place]         .96        [place]          .78          +-------+-----------+-----------+------------+------------+ Left   [place]         .77  1.06        .81          +-------+-----------+-----------+------------+------------+ Compared to prior study on 05/29/2019. Bilateral TBIs appear essentially unchanged compared to prior study on 05/29/2019.  Summary: Right: Resting right ankle-brachial index indicates noncompressible right lower extremity arteries. The right toe-brachial index is normal. ABIs are unreliable. Left: Resting left ankle-brachial index indicates noncompressible left lower extremity arteries. The left toe-brachial index is normal. ABIs are unreliable.  *See table(s) above for measurements and observations.  Electronically signed by Hortencia Pilar MD on 11/30/2019 at 4:13:01 PM.    Final    VAS US CAROTID  Result Date: 11/30/2019 Carotid Arterial Duplex Study Indications:       Carotid stenosis. Comparison Study:  05/29/2019 Performing Technologist: Charlane Ferretti RT (R)(VS)  Examination Guidelines: A complete evaluation includes B-mode imaging, spectral Doppler, color Doppler, and power Doppler as needed of all accessible portions of each vessel. Bilateral testing is considered an integral part of a complete examination. Limited examinations for reoccurring indications may be performed as noted.  Right Carotid Findings: +----------+--------+--------+--------+------------------+--------------------+            PSV cm/sEDV cm/sStenosisPlaque DescriptionComments             +----------+--------+--------+--------+------------------+--------------------+ CCA Prox  89      11                                                     +----------+--------+--------+--------+------------------+--------------------+ CCA Mid   82      8                                                      +----------+--------+--------+--------+------------------+--------------------+ CCA Distal98      10              calcific                               +----------+--------+--------+--------+------------------+--------------------+ ICA Prox  185     15              calcific          ICA/CCA ratio = 2.10 +----------+--------+--------+--------+------------------+--------------------+ ICA Mid   143     18                                                     +----------+--------+--------+--------+------------------+--------------------+ ICA Distal132     13                                                     +----------+--------+--------+--------+------------------+--------------------+ ECA       229     21              calcific                               +----------+--------+--------+--------+------------------+--------------------+ +----------+--------+-------+--------+-------------------+  PSV cm/sEDV cmsDescribeArm Pressure (mmHG) +----------+--------+-------+--------+-------------------+ [id number]                                        +----------+--------+-------+--------+-------------------+ +---------+--------+--+--------+--+ VertebralPSV cm/s65EDV cm/s10 +---------+--------+--+--------+--+ Left Carotid Findings: +----------+--------+--------+--------+------------------+--------------------+           PSV cm/sEDV cm/sStenosisPlaque DescriptionComments             +----------+--------+--------+--------+------------------+--------------------+  CCA Prox  106     8               calcific                               +----------+--------+--------+--------+------------------+--------------------+ CCA Mid   118     8               calcific          intimal thickening   +----------+--------+--------+--------+------------------+--------------------+ CCA Distal146     18              calcific          intimal thickening   +----------+--------+--------+--------+------------------+--------------------+ ICA Prox  125     14              calcific          ICA/CCA ratio = 1.10 +----------+--------+--------+--------+------------------+--------------------+ ICA Mid   119     16                                                     +----------+--------+--------+--------+------------------+--------------------+ ICA Distal117     9                                                      +----------+--------+--------+--------+------------------+--------------------+ ECA       183     1                                                      +----------+--------+--------+--------+------------------+--------------------+ +----------+--------+--------+--------+-------------------+           PSV cm/sEDV cm/sDescribeArm Pressure (mmHG) +----------+--------+--------+--------+-------------------+ [id number]                                         +----------+--------+--------+--------+-------------------+ +---------+--------+--+--------+-+ VertebralPSV cm/s54EDV cm/s4 +---------+--------+--+--------+-+   Summary: Right Carotid: Velocities in the right ICA are consistent with a 40-59%                stenosis. The ECA appears >50% stenosed. Left Carotid: Velocities in the left ICA are consistent with a 40-59% stenosis.               The ECA appears >50% stenosed. Vertebrals:  Bilateral vertebral arteries demonstrate antegrade flow. Bilateral              vertebral arteries demonstrate high resistant  flow. Subclavians:  Bilateral subclavian arteries were stenotic. *See table(s) above for measurements and observations.  Electronically signed by Hortencia Pilar MD on 11/30/2019 at 4:13:04 PM.    Final      Assessment/Plan 1. Atherosclerosis of native artery of left lower extremity with intermittent claudication (HCC)  Recommend:  The patient has evidence of atherosclerosis of the lower extremities with claudication.  The patient does not voice lifestyle limiting changes at this point in time.  Noninvasive studies do not suggest clinically significant change.  No invasive studies, angiography or surgery at this time The patient should continue walking and begin a more formal exercise program.  The patient should continue antiplatelet therapy and aggressive treatment of the lipid abnormalities  No changes in the patient's medications at this time  The patient should continue wearing graduated compression socks 10-15 mmHg strength to control the mild edema.   - VAS [place] LOWER EXTREMITY ARTERIAL DUPLEX; Future - VAS [place] ABI WITH/WO TBI; Future  2. Bilateral carotid artery stenosis Recommend:  Given the patient's asymptomatic subcritical stenosis no further invasive testing or surgery at this time.  Duplex ultrasound shows 40-59% stenosis bilaterally.  Continue antiplatelet therapy as prescribed Continue management of CAD, HTN and Hyperlipidemia Healthy heart diet,  encouraged exercise at least 4 times per week Follow up in 6 months with duplex ultrasound and physical exam   3. Essential (primary) hypertension Continue antihypertensive medications as already ordered, these medications have been reviewed and there are no changes at this time.   4. Gastroesophageal reflux disease without esophagitis Continue PPI as already ordered, this medication has been reviewed and there are no changes at this time.  Avoidence of caffeine and alcohol  Moderate elevation of the head of the bed   5. Type II  diabetes mellitus with complication (HCC) Continue hypoglycemic medications as already ordered, these medications have been reviewed and there are no changes at this time.  Hgb A1C to be monitored as already arranged by primary service   6. Mixed hyperlipidemia Continue statin as ordered and reviewed, no changes at this time     Hortencia Pilar, MD  12/06/2019 3:49 PM

## 2019-12-07 DIAGNOSIS — I739 Peripheral vascular disease, unspecified: Secondary | ICD-10-CM | POA: Diagnosis not present

## 2019-12-07 DIAGNOSIS — I2510 Atherosclerotic heart disease of native coronary artery without angina pectoris: Secondary | ICD-10-CM | POA: Diagnosis not present

## 2019-12-07 DIAGNOSIS — E7800 Pure hypercholesterolemia, unspecified: Secondary | ICD-10-CM | POA: Diagnosis not present

## 2019-12-07 DIAGNOSIS — I119 Hypertensive heart disease without heart failure: Secondary | ICD-10-CM | POA: Diagnosis not present

## 2019-12-25 ENCOUNTER — Inpatient Hospital Stay
Admission: EM | Admit: 2019-12-25 | Discharge: 2019-12-28 | DRG: 378 | Disposition: A | Discharge status: Home Health | Payer: Medicare Other | Attending: Internal Medicine | Admitting: Internal Medicine

## 2019-12-25 ENCOUNTER — Other Ambulatory Visit: Payer: Self-pay

## 2019-12-25 ENCOUNTER — Emergency Department: Payer: Medicare Other

## 2019-12-25 DIAGNOSIS — E113399 Type 2 diabetes mellitus with moderate nonproliferative diabetic retinopathy without macular edema, unspecified eye: Secondary | ICD-10-CM | POA: Diagnosis present

## 2019-12-25 DIAGNOSIS — I4891 Unspecified atrial fibrillation: Secondary | ICD-10-CM | POA: Diagnosis present

## 2019-12-25 DIAGNOSIS — E1122 Type 2 diabetes mellitus with diabetic chronic kidney disease: Secondary | ICD-10-CM | POA: Diagnosis present

## 2019-12-25 DIAGNOSIS — K625 Hemorrhage of anus and rectum: Secondary | ICD-10-CM | POA: Diagnosis not present

## 2019-12-25 DIAGNOSIS — N1831 Chronic kidney disease, stage 3a: Secondary | ICD-10-CM | POA: Diagnosis present

## 2019-12-25 DIAGNOSIS — E1142 Type 2 diabetes mellitus with diabetic polyneuropathy: Secondary | ICD-10-CM

## 2019-12-25 DIAGNOSIS — Z20822 Contact with and (suspected) exposure to covid-19: Secondary | ICD-10-CM | POA: Diagnosis present

## 2019-12-25 DIAGNOSIS — Z794 Long term (current) use of insulin: Secondary | ICD-10-CM | POA: Diagnosis not present

## 2019-12-25 DIAGNOSIS — Z87891 Personal history of nicotine dependence: Secondary | ICD-10-CM

## 2019-12-25 DIAGNOSIS — E872 Acidosis: Secondary | ICD-10-CM | POA: Diagnosis not present

## 2019-12-25 DIAGNOSIS — Z8673 Personal history of transient ischemic attack (TIA), and cerebral infarction without residual deficits: Secondary | ICD-10-CM

## 2019-12-25 DIAGNOSIS — E1136 Type 2 diabetes mellitus with diabetic cataract: Secondary | ICD-10-CM | POA: Diagnosis present

## 2019-12-25 DIAGNOSIS — K922 Gastrointestinal hemorrhage, unspecified: Secondary | ICD-10-CM | POA: Diagnosis not present

## 2019-12-25 DIAGNOSIS — K5731 Diverticulosis of large intestine without perforation or abscess with bleeding: Secondary | ICD-10-CM | POA: Diagnosis present

## 2019-12-25 DIAGNOSIS — R58 Hemorrhage, not elsewhere classified: Secondary | ICD-10-CM | POA: Diagnosis not present

## 2019-12-25 DIAGNOSIS — N189 Chronic kidney disease, unspecified: Secondary | ICD-10-CM

## 2019-12-25 DIAGNOSIS — Z888 Allergy status to other drugs, medicaments and biological substances status: Secondary | ICD-10-CM

## 2019-12-25 DIAGNOSIS — Z79899 Other long term (current) drug therapy: Secondary | ICD-10-CM | POA: Diagnosis not present

## 2019-12-25 DIAGNOSIS — Z885 Allergy status to narcotic agent status: Secondary | ICD-10-CM | POA: Diagnosis not present

## 2019-12-25 DIAGNOSIS — N183 Chronic kidney disease, stage 3 unspecified: Secondary | ICD-10-CM

## 2019-12-25 DIAGNOSIS — I2510 Atherosclerotic heart disease of native coronary artery without angina pectoris: Secondary | ICD-10-CM | POA: Diagnosis present

## 2019-12-25 DIAGNOSIS — Z882 Allergy status to sulfonamides status: Secondary | ICD-10-CM

## 2019-12-25 DIAGNOSIS — Z833 Family history of diabetes mellitus: Secondary | ICD-10-CM

## 2019-12-25 DIAGNOSIS — I739 Peripheral vascular disease, unspecified: Secondary | ICD-10-CM

## 2019-12-25 DIAGNOSIS — E782 Mixed hyperlipidemia: Secondary | ICD-10-CM | POA: Diagnosis not present

## 2019-12-25 DIAGNOSIS — Z7982 Long term (current) use of aspirin: Secondary | ICD-10-CM

## 2019-12-25 DIAGNOSIS — E1151 Type 2 diabetes mellitus with diabetic peripheral angiopathy without gangrene: Secondary | ICD-10-CM | POA: Diagnosis present

## 2019-12-25 DIAGNOSIS — I10 Essential (primary) hypertension: Secondary | ICD-10-CM | POA: Diagnosis not present

## 2019-12-25 DIAGNOSIS — I700 Atherosclerosis of aorta: Secondary | ICD-10-CM | POA: Diagnosis not present

## 2019-12-25 DIAGNOSIS — R42 Dizziness and giddiness: Secondary | ICD-10-CM | POA: Diagnosis not present

## 2019-12-25 DIAGNOSIS — I129 Hypertensive chronic kidney disease with stage 1 through stage 4 chronic kidney disease, or unspecified chronic kidney disease: Secondary | ICD-10-CM | POA: Diagnosis present

## 2019-12-25 DIAGNOSIS — E785 Hyperlipidemia, unspecified: Secondary | ICD-10-CM | POA: Diagnosis present

## 2019-12-25 DIAGNOSIS — F419 Anxiety disorder, unspecified: Secondary | ICD-10-CM | POA: Diagnosis present

## 2019-12-25 DIAGNOSIS — D62 Acute posthemorrhagic anemia: Secondary | ICD-10-CM

## 2019-12-25 DIAGNOSIS — I252 Old myocardial infarction: Secondary | ICD-10-CM | POA: Diagnosis not present

## 2019-12-25 DIAGNOSIS — H353230 Exudative age-related macular degeneration, bilateral, stage unspecified: Secondary | ICD-10-CM | POA: Diagnosis present

## 2019-12-25 DIAGNOSIS — N179 Acute kidney failure, unspecified: Secondary | ICD-10-CM

## 2019-12-25 DIAGNOSIS — E1169 Type 2 diabetes mellitus with other specified complication: Secondary | ICD-10-CM

## 2019-12-25 DIAGNOSIS — I6529 Occlusion and stenosis of unspecified carotid artery: Secondary | ICD-10-CM | POA: Diagnosis present

## 2019-12-25 DIAGNOSIS — R1111 Vomiting without nausea: Secondary | ICD-10-CM | POA: Diagnosis not present

## 2019-12-25 DIAGNOSIS — Z7902 Long term (current) use of antithrombotics/antiplatelets: Secondary | ICD-10-CM

## 2019-12-25 DIAGNOSIS — I959 Hypotension, unspecified: Secondary | ICD-10-CM | POA: Diagnosis not present

## 2019-12-25 HISTORY — DX: Gastrointestinal hemorrhage, unspecified: K92.2

## 2019-12-25 LAB — COMPREHENSIVE METABOLIC PANEL
ALT: 14 U/L (ref 0–44)
AST: 18 U/L (ref 15–41)
Albumin: 3.5 g/dL (ref 3.5–5.0)
Alkaline Phosphatase: 63 U/L (ref 38–126)
Anion gap: 7 (ref 5–15)
BUN: 38 mg/dL — ABNORMAL HIGH (ref 8–23)
CO2: 25 mmol/L (ref 22–32)
Calcium: 8.6 mg/dL — ABNORMAL LOW (ref 8.9–10.3)
Chloride: 103 mmol/L (ref 98–111)
Creatinine, Ser: 1.3 mg/dL — ABNORMAL HIGH (ref 0.44–1.00)
GFR calc Af Amer: 44 mL/min — ABNORMAL LOW (ref >60)
GFR calc non Af Amer: 38 mL/min — ABNORMAL LOW (ref >60)
Glucose, Bld: 170 mg/dL — ABNORMAL HIGH (ref 70–99)
Potassium: 4.5 mmol/L (ref 3.5–5.1)
Sodium: 135 mmol/L (ref 135–145)
Total Bilirubin: 0.7 mg/dL (ref 0.3–1.2)
Total Protein: 6 g/dL — ABNORMAL LOW (ref 6.5–8.1)

## 2019-12-25 LAB — CBC WITH DIFFERENTIAL/PLATELET
Abs Immature Granulocytes: 0.06 10*3/uL (ref 0.00–0.07)
Abs Immature Granulocytes: 0.07 10*3/uL (ref 0.00–0.07)
Basophils Absolute: 0 10*3/uL (ref 0.0–0.1)
Basophils Absolute: 0 10*3/uL (ref 0.0–0.1)
Basophils Relative: 0 %
Basophils Relative: 0 %
Eosinophils Absolute: 0.2 10*3/uL (ref 0.0–0.5)
Eosinophils Absolute: 0.1 10*3/uL (ref 0.0–0.5)
Eosinophils Relative: 2 %
Eosinophils Relative: 1 %
HCT: 24.2 % — ABNORMAL LOW (ref 36.0–46.0)
HCT: 22.3 % — ABNORMAL LOW (ref 36.0–46.0)
Hemoglobin: 7.7 g/dL — ABNORMAL LOW (ref 12.0–15.0)
Hemoglobin: 7.1 g/dL — ABNORMAL LOW (ref 12.0–15.0)
Immature Granulocytes: 1 %
Immature Granulocytes: 1 %
Lymphocytes Relative: 15 %
Lymphocytes Relative: 15 %
Lymphs Abs: 1.4 10*3/uL (ref 0.7–4.0)
Lymphs Abs: 1.5 10*3/uL (ref 0.7–4.0)
MCH: 29.2 pg (ref 26.0–34.0)
MCH: 29.1 pg (ref 26.0–34.0)
MCHC: 31.8 g/dL (ref 30.0–36.0)
MCHC: 31.8 g/dL (ref 30.0–36.0)
MCV: 91.7 fL (ref 80.0–100.0)
MCV: 91.4 fL (ref 80.0–100.0)
Monocytes Absolute: 0.9 10*3/uL (ref 0.1–1.0)
Monocytes Absolute: 0.8 10*3/uL (ref 0.1–1.0)
Monocytes Relative: 9 %
Monocytes Relative: 7 %
Neutro Abs: 7.2 10*3/uL (ref 1.7–7.7)
Neutro Abs: 7.9 10*3/uL — ABNORMAL HIGH (ref 1.7–7.7)
Neutrophils Relative %: 73 %
Neutrophils Relative %: 76 %
Platelets: 261 10*3/uL (ref 150–400)
Platelets: 242 10*3/uL (ref 150–400)
RBC: 2.64 MIL/uL — ABNORMAL LOW (ref 3.87–5.11)
RBC: 2.44 MIL/uL — ABNORMAL LOW (ref 3.87–5.11)
RDW: 12.9 % (ref 11.5–15.5)
RDW: 13 % (ref 11.5–15.5)
WBC: 9.8 10*3/uL (ref 4.0–10.5)
WBC: 10.4 10*3/uL (ref 4.0–10.5)
nRBC: 0 % (ref 0.0–0.2)
nRBC: 0 % (ref 0.0–0.2)

## 2019-12-25 LAB — TROPONIN I (HIGH SENSITIVITY)
Troponin I (High Sensitivity): 12 ng/L (ref <18)
Troponin I (High Sensitivity): 12 ng/L (ref <18)

## 2019-12-25 LAB — TSH: TSH: 3.512 u[IU]/mL (ref 0.350–4.500)

## 2019-12-25 LAB — RESPIRATORY PANEL BY RT PCR (FLU A&B, COVID)
Influenza A by PCR: NEGATIVE
Influenza B by PCR: NEGATIVE
SARS Coronavirus 2 by RT PCR: NEGATIVE

## 2019-12-25 LAB — HEMOGLOBIN AND HEMATOCRIT, BLOOD
HCT: 29.9 % — ABNORMAL LOW (ref 36.0–46.0)
HCT: 30.1 % — ABNORMAL LOW (ref 36.0–46.0)
Hemoglobin: 10.2 g/dL — ABNORMAL LOW (ref 12.0–15.0)
Hemoglobin: 10.2 g/dL — ABNORMAL LOW (ref 12.0–15.0)

## 2019-12-25 LAB — PREPARE RBC (CROSSMATCH)

## 2019-12-25 LAB — APTT: aPTT: 33 seconds (ref 24–36)

## 2019-12-25 LAB — PROTIME-INR
INR: 1.1 (ref 0.8–1.2)
Prothrombin Time: 13.8 seconds (ref 11.4–15.2)

## 2019-12-25 LAB — GLUCOSE, CAPILLARY: Glucose-Capillary: 180 mg/dL — ABNORMAL HIGH (ref 70–99)

## 2019-12-25 LAB — MRSA PCR SCREENING: MRSA by PCR: NEGATIVE

## 2019-12-25 MED ORDER — OCUVITE-LUTEIN PO CAPS
ORAL_CAPSULE | Freq: Two times a day (BID) | ORAL | Status: DC
  Administered 2019-12-26 – 2019-12-28 (×4): 1 via ORAL
  Filled 2019-12-25 (×8): qty 1

## 2019-12-25 MED ORDER — ACETAMINOPHEN 650 MG RE SUPP: 650.0000 mg | Freq: Four times a day (QID) | RECTAL | Status: DC | PRN

## 2019-12-25 MED ORDER — PANTOPRAZOLE SODIUM 40 MG IV SOLR: 40.0000 mg | Freq: Two times a day (BID) | INTRAVENOUS | Status: DC

## 2019-12-25 MED ORDER — SODIUM CHLORIDE 0.9 % IV SOLN
8.0000 mg/h | INTRAVENOUS | Status: DC
  Administered 2019-12-25 – 2019-12-26 (×3): 8 mg/h via INTRAVENOUS
  Filled 2019-12-25 (×3): qty 80

## 2019-12-25 MED ORDER — TRAZODONE HCL 50 MG PO TABS: 25.0000 mg | ORAL_TABLET | Freq: Every evening | ORAL | Status: DC | PRN

## 2019-12-25 MED ORDER — FERROUS SULFATE 325 (65 FE) MG PO TABS
325.0000 mg | ORAL_TABLET | Freq: Every day | ORAL | Status: DC
  Administered 2019-12-26 – 2019-12-28 (×3): 325 mg via ORAL
  Filled 2019-12-25 (×4): qty 1

## 2019-12-25 MED ORDER — INSULIN GLARGINE 100 UNIT/ML ~~LOC~~ SOLN
10.0000 [IU] | Freq: Every day | SUBCUTANEOUS | Status: DC
  Administered 2019-12-25 – 2019-12-28 (×4): 10 [IU] via SUBCUTANEOUS
  Filled 2019-12-25 (×5): qty 0.1

## 2019-12-25 MED ORDER — SODIUM CHLORIDE 0.9 % IV SOLN: INTRAVENOUS | Status: DC

## 2019-12-25 MED ORDER — NITROGLYCERIN 0.4 MG SL SUBL: 0.4000 mg | SUBLINGUAL_TABLET | SUBLINGUAL | Status: DC | PRN

## 2019-12-25 MED ORDER — BACLOFEN 10 MG PO TABS
10.0000 mg | ORAL_TABLET | Freq: Every day | ORAL | Status: DC
  Administered 2019-12-25 – 2019-12-27 (×3): 10 mg via ORAL
  Filled 2019-12-25 (×4): qty 1

## 2019-12-25 MED ORDER — DILTIAZEM HCL ER COATED BEADS 180 MG PO CP24
180.0000 mg | ORAL_CAPSULE | Freq: Two times a day (BID) | ORAL | Status: DC
  Administered 2019-12-25 – 2019-12-28 (×6): 180 mg via ORAL
  Filled 2019-12-25 (×8): qty 1

## 2019-12-25 MED ORDER — VITAMIN B-12 1000 MCG PO TABS
1000.0000 ug | ORAL_TABLET | Freq: Every day | ORAL | Status: DC
  Administered 2019-12-26 – 2019-12-28 (×3): 1000 ug via ORAL
  Filled 2019-12-25 (×3): qty 1

## 2019-12-25 MED ORDER — SODIUM CHLORIDE 0.9 % IV SOLN: Freq: Once | INTRAVENOUS | Status: DC

## 2019-12-25 MED ORDER — ONDANSETRON HCL 4 MG/2ML IJ SOLN: 4.0000 mg | Freq: Four times a day (QID) | INTRAMUSCULAR | Status: DC | PRN

## 2019-12-25 MED ORDER — CHLORHEXIDINE GLUCONATE CLOTH 2 % EX PADS
6.0000 | MEDICATED_PAD | Freq: Every day | CUTANEOUS | Status: DC
  Administered 2019-12-25: 6 via TOPICAL

## 2019-12-25 MED ORDER — SODIUM CHLORIDE 0.9 % IV SOLN
10.0000 mL/h | Freq: Once | INTRAVENOUS | Status: AC
  Administered 2019-12-27: 10 mL/h via INTRAVENOUS

## 2019-12-25 MED ORDER — ONDANSETRON HCL 4 MG PO TABS: 4.0000 mg | ORAL_TABLET | Freq: Four times a day (QID) | ORAL | Status: DC | PRN

## 2019-12-25 MED ORDER — GABAPENTIN 100 MG PO CAPS
100.0000 mg | ORAL_CAPSULE | Freq: Every day | ORAL | Status: DC
  Administered 2019-12-25 – 2019-12-27 (×3): 100 mg via ORAL
  Filled 2019-12-25 (×3): qty 1

## 2019-12-25 MED ORDER — SODIUM CHLORIDE 0.9 % IV BOLUS
1000.0000 mL | Freq: Once | INTRAVENOUS | Status: AC
  Administered 2019-12-25: 1000 mL via INTRAVENOUS

## 2019-12-25 MED ORDER — ROSUVASTATIN CALCIUM 10 MG PO TABS
5.0000 mg | ORAL_TABLET | ORAL | Status: DC
  Administered 2019-12-26: 5 mg via ORAL
  Filled 2019-12-25: qty 1

## 2019-12-25 MED ORDER — ACETAMINOPHEN 325 MG PO TABS: 650.0000 mg | ORAL_TABLET | Freq: Four times a day (QID) | ORAL | Status: DC | PRN

## 2019-12-25 MED ORDER — PANTOPRAZOLE SODIUM 40 MG IV SOLR
40.0000 mg | INTRAVENOUS | Status: AC
  Filled 2019-12-25 (×2): qty 40

## 2019-12-25 NOTE — H&P (Signed)
[facility] [place] NAME: Jill Hudson    MR#:  [id number]  DATE OF BIRTH:  [birth year]  DATE OF ADMISSION:  12/25/2019  PRIMARY CARE PHYSICIAN: [name], MD   REQUESTING/REFERRING PHYSICIAN: Lurline Hare, MD  CHIEF COMPLAINT:   Chief Complaint  Patient presents with  . Rectal Bleeding    HISTORY OF PRESENT ILLNESS:  Jill Hudson  is a 85 y.o. female with a known history of hypertension, dyslipidemia, type 2 diabetes mellitus, coronary artery disease and peripheral arterial disease, who presented to the emergency room with acute onset of rectal bleeding while having a bowel movement at home.  She stated that she saw bright red blood and blood clots and admitted to melena.  No nausea or vomiting or heartburn.  No fever or chills.  She admitted to light abdominal cramps.  No dysuria, oliguria or hematuria or flank pain.  No cough or wheezing or hemoptysis.  No other bleeding diathesis.  She denies any dyspnea or chest pain or palpitations.  No recent COVID-19 exposure.  The patient was still having hematochezia in the ER.  Upon presentation to the emergency room, blood pressure was 156/69 with otherwise normal vital signs.  Later on BP was 163/44 then 149/45.  Labs revealed CBC with hemoglobin of 7.7 hematocrit of 24.2 compared to 9.7 and 31.8 on 11/02/2018.  CMP showed blood glucose of 170 BUN of 38 with a creatinine of 1.3 compared to 22 and 1.17 on 12/26/2018.  High-sensitivity troponin I was 12.  EKG showed normal sinus rhythm with a rate of 71.  Portable chest x-ray showed aortic atherosclerosis with no acute cardiopulmonary disease.  The patient was typed and crossmatched for 1 unit of packed red blood cells.  She was given 1 L bolus of IV normal saline.  She will be admitted to a stepdown unit for further evaluation and management. PAST MEDICAL HISTORY:   Past Medical History:  Diagnosis Date  . Allergies   . Anxiety   . Arthritis    spine and shoulder    . Atherosclerosis of artery of extremity with rest pain ([place]) 10/12/2018  . Cancer ([place])    skin  . Diabetes mellitus without complication ([place])   . Heart murmur   . Hyperlipidemia   . Hypertension   . Macula lutea degeneration   . Mitral and aortic valve disease   . Myocardial infarction [facility])    may have had a "light" heart attack  . Occasional tremors   . PAD (peripheral artery disease) ([place])   . Shingles    patient unaware but daughter confirms. it was a long time ago  . Stroke [facility]) 01/2017   may have had a slight stroke  . TIA (transient ischemic attack) 01/2017  . UTI (urinary tract infection)   . Vascular disease, peripheral ([place])     PAST SURGICAL HISTORY:   Past Surgical History:  Procedure Laterality Date  . CARDIAC CATHETERIZATION  1998   40% LM, 95% Ramus interm  . CATARACT EXTRACTION, BILATERAL    . COLONOSCOPY WITH PROPOFOL N/A 08/12/2018   Procedure: COLONOSCOPY WITH PROPOFOL;  Surgeon: Lucilla Lame, MD;  Location: [facility] ENDOSCOPY;  Service: Endoscopy;  Laterality: N/A;  . ENDARTERECTOMY FEMORAL Left 10/12/2018   Procedure: ENDARTERECTOMY FEMORAL;  Surgeon: Katha Cabal, MD;  Location: [facility] ORS;  Service: Vascular;  Laterality: Left;  angioplasty and left SFA stent placement  . EYE SURGERY Bilateral    cataract extractions  .  LOWER EXTREMITY ANGIOGRAPHY Left 08/23/2017   Procedure: Lower Extremity Angiography;  Surgeon: Algernon Huxley, MD;  Location: [place] CV LAB;  Service: Cardiovascular;  Laterality: Left;  . LOWER EXTREMITY ANGIOGRAPHY Left 07/26/2018   Procedure: LOWER EXTREMITY ANGIOGRAPHY;  Surgeon: Katha Cabal, MD;  Location: [place] CV LAB;  Service: Cardiovascular;  Laterality: Left;  . LOWER EXTREMITY ANGIOGRAPHY Left 09/16/2018   Procedure: LOWER EXTREMITY ANGIOGRAPHY;  Surgeon: Katha Cabal, MD;  Location: [place] CV LAB;  Service: Cardiovascular;  Laterality: Left;  . PTCA  08/2013   Left common iliac  . PTCA   12/2012   left ext iliac  . TUBAL LIGATION      SOCIAL HISTORY:   Social History   Tobacco Use  . Smoking status: Former Smoker    Packs/day: 2.00    Years: 37.00    Pack years: 74.00    Types: Cigarettes    Quit date: 1980    Years since quitting: 41.1  . Smokeless tobacco: Never Used  . Tobacco comment: smoking cessation materials not required  Substance Use Topics  . Alcohol use: No    Alcohol/week: 0.0 standard drinks    FAMILY HISTORY:   Family History  Problem Relation Age of Onset  . Dementia Mother   . Diabetes Father     DRUG ALLERGIES:   Allergies  Allergen Reactions  . Saxagliptin Diarrhea  . Epinephrine Other (See Comments)    Patient does not remember what happens when she uses this  . Atorvastatin Other (See Comments)    Muscle aches  . Codeine Other (See Comments)    Upset stomach  . Ezetimibe Other (See Comments)    Myalgias(ZETIA)  . Limonene Rash    Patient does not recall this reaction  . Nitrofurantoin Rash and Other (See Comments)    Pruitus  . Sulfa Antibiotics Rash and Other (See Comments)    Sore mouth     REVIEW OF SYSTEMS:   ROS As per history of present illness. All pertinent systems were reviewed above. Constitutional,  HEENT, cardiovascular, respiratory, GI, GU, musculoskeletal, neuro, psychiatric, endocrine,  integumentary and hematologic systems were reviewed and are otherwise  negative/unremarkable except for positive findings mentioned above in the HPI.   MEDICATIONS AT HOME:   Prior to Admission medications   Medication Sig Start Date End Date Taking? Authorizing Provider  acetaminophen (TYLENOL) 650 MG CR tablet Take 1,300 mg by mouth every 8 (eight) hours as needed for pain.    [provider]  amLODipine (NORVASC) 2.5 MG tablet Take 2.5 mg by mouth daily. 05/20/18   [provider]  aspirin 81 MG tablet Take 81 mg by mouth daily.     [provider]  B-D ULTRAFINE III SHORT PEN 31G X 8  MM MISC USE AS DIRECTED 12/19/18   [name], MD  baclofen (LIORESAL) 10 MG tablet TAKE 1 TABLET(10 MG) BY MOUTH THREE TIMES DAILY Patient taking differently: Take 10 mg by mouth at bedtime.  02/09/18   [name], MD  chlorthalidone (HYGROTON) 25 MG tablet Take 25 mg by mouth daily.    [provider]  clindamycin (CLEOCIN) 2 % vaginal cream Place 1 Applicatorful vaginally at bedtime. 10/02/19   [name], MD  clopidogrel (PLAVIX) 75 MG tablet TAKE 1 TABLET BY MOUTH EVERY DAY 09/15/19   Schnier, Dolores Lory, MD  Cyanocobalamin (RA VITAMIN B-12 TR) 1000 MCG TBCR Take 1,000 mcg by mouth daily.  [provider]  diltiazem (CARTIA XT) 180 MG 24 hr capsule Take 180 mg by mouth 2 (two) times daily.     [provider]  ferrous sulfate 325 (65 FE) MG tablet Take 325 mg by mouth daily with breakfast.    [provider]  gabapentin (NEURONTIN) 100 MG capsule TAKE 1 TO 2 CAPSULES(100 TO 200 MG) BY MOUTH AT BEDTIME 08/30/19   [name], MD  insulin aspart (NOVOLOG FLEXPEN) 100 UNIT/ML FlexPen Inject 3 Units into the skin 3 (three) times daily with meals. At lunch Patient taking differently: Inject 2 Units into the skin 3 (three) times daily with meals. At breakfast 01/18/19   [name], MD  Insulin Glargine [facility]) 100 UNIT/ML SOPN Inject 0.12 mLs (12 Units total) into the skin daily. 06/27/19   [name], MD  losartan (COZAAR) 50 MG tablet Take 50 mg by mouth 2 (two) times daily.  06/20/18   [provider]  metFORMIN (GLUCOPHAGE-XR) 500 MG 24 hr tablet TAKE 1 TABLET BY MOUTH TWICE DAILY 03/25/19   [name], MD  Multiple Vitamins-Minerals (PRESERVISION AREDS PO) Take 1 capsule by mouth 2 (two) times daily.     [provider]  nitroGLYCERIN (NITROSTAT) 0.4 MG SL tablet 1 tab under the tongue for CP, may repeat in 5 minutes up to 3 doses 09/07/18   [name], MD  rosuvastatin (CRESTOR) 5 MG  tablet Take 5 mg by mouth every other day.  03/02/18   [provider]  traMADol (ULTRAM) 50 MG tablet Take 1 tablet (50 mg total) by mouth every 8 (eight) hours as needed. Patient not taking: Reported on 11/30/2019 11/25/18   Coral Spikes, DO      VITAL SIGNS:  Blood pressure (!) 156/69, pulse 73, resp. rate 20, height 5\' 1"  (1.549 m), weight 52.6 kg, SpO2 100 %.  PHYSICAL EXAMINATION:  Physical Exam  GENERAL:  85 y.o.-year-old pleasant Caucasian female patient lying in the bed with no acute distress.  EYES: Pupils equal, round, reactive to light and accommodation. No scleral icterus.  Positive pallor extraocular muscles intact.  HEENT: Head atraumatic, normocephalic. Oropharynx and nasopharynx clear.  NECK:  Supple, no jugular venous distention. No thyroid enlargement, no tenderness.  LUNGS: Normal breath sounds bilaterally, no wheezing, rales,rhonchi or crepitation. No use of accessory muscles of respiration.  CARDIOVASCULAR: Regular rate and rhythm, S1, S2 normal. No murmurs, rubs, or gallops.  ABDOMEN: Soft, nondistended with mild lower abdominal tenderness without rebound tenderness guarding or rigidity.. Bowel sounds present. No organomegaly or mass.  EXTREMITIES: No pedal edema, cyanosis, or clubbing.  NEUROLOGIC: Cranial nerves II through XII are intact. Muscle strength 5/5 in all extremities. Sensation intact. Gait not checked.  PSYCHIATRIC: The patient is alert and oriented x 3.  Normal affect and good eye contact. SKIN: No obvious rash, lesion, or ulcer.   LABORATORY PANEL:   CBC Recent Labs  Lab 12/25/19 0419  WBC 9.8  HGB 7.7*  HCT 24.2*  PLT 261   ------------------------------------------------------------------------------------------------------------------  Chemistries  Recent Labs  Lab 12/25/19 0419  NA 135  K 4.5  CL 103  CO2 25  GLUCOSE 170*  BUN 38*  CREATININE 1.30*  CALCIUM 8.6*  AST 18  ALT 14  ALKPHOS 63  BILITOT 0.7    ------------------------------------------------------------------------------------------------------------------  Cardiac Enzymes No results for input(s): TROPONINI in the last 168 hours. ------------------------------------------------------------------------------------------------------------------  RADIOLOGY:  DG Chest Port 1 View  Result Date: 12/25/2019 CLINICAL DATA:  Rectal bleeding. EXAM: PORTABLE CHEST 1 VIEW COMPARISON:  Two-view chest x-ray 11/02/2018 FINDINGS: The heart size is normal. Atherosclerotic changes are noted at the aortic arch. No edema or effusion is present. No focal airspace disease is present. Axial skeleton is unremarkable. IMPRESSION: 1. No acute cardiopulmonary disease. 2. Aortic atherosclerosis. Electronically Signed   By: San Morelle M.D.   On: 12/25/2019 04:35      IMPRESSION AND PLAN:   1.  GI bleeding. -Differential diagnosis would include diverticular disease, bleeding masses, AVMs, upper GI etiology as in gastritis, duodenitis, gastric or duodenal ulcers erosions or AVMs -The patient will be admitted to a stepdown unit for close monitoring given recurrent bleeding. -We will follow serial hemoglobin hematocrits. -We will type and crossmatch and transfuse a total of 2 units of packed red blood cells. -A GI consultation will be obtained.  I notified Dr. Alice Reichert about the patient. -She will be hydrated with IV normal saline. -We will place her on IV Protonix with a bolus and drip. -Her aspirin and Plavix will obviously be held off. -The patient will be kept n.p.o.  2.  Acute kidney injury. -The patient will be placed on hydration with IV normal saline. -We will follow her BMP -We will hold her Metformin, Cozaar and chlorthalidone.  3.  Hypertension. -She will be placed on as needed IV labetalol. -We will continue amlodipine and diltiazem XT while holding chlorthalidone and Cozaar off given acute kidney injury.  4.  Type 2 diabetes  mellitus with peripheral neuropathy. -The patient will be placed on supplemental coverage with NovoLog and will place her on her basal coverage while cutting down the dose to half while she is n.p.o. -We will hold off her Metformin.  5.  Dyslipidemia. -We will continue statin therapy.  6.  Coronary artery disease. -We will continue as needed sublingual nitroglycerin and statin therapy will be resumed.  7.  Peripheral neuropathy. -We will continue Neurontin.  8.  DVT prophylaxis. -SCDs. -Medical prophylaxis contraindicated due to GI bleeding.   All the records are reviewed and case discussed with ED provider. The plan of care was discussed in details with the patient (and family). I answered all questions. The patient agreed to proceed with the above mentioned plan. Further management will depend upon hospital course.   CODE STATUS: Full code  TOTAL TIME TAKING CARE OF THIS PATIENT: 55 minutes.    Christel Mormon M.D on 12/25/2019 at 5:58 AM  [facility]   From 7 PM-7 AM, contact night-coverage [email or web address]  CC: Primary care physician; [name], MD   Note: This dictation was prepared with Dragon dictation along with smaller phrase technology. Any transcriptional errors that result from this process are unintentional.

## 2019-12-25 NOTE — ED Triage Notes (Signed)
Pt arrives to ED from home via [facility] with c/c of rectal bleeding beginning at approx 9pm last night. EMS reports transport vitals of 130/70, p70 NSR, O2 st 98% on room air. Upon arrival pt A&Ox4, NAD. Dr Beather Arbour at bedside.

## 2019-12-25 NOTE — Progress Notes (Signed)
[place] visited pt. while rounding on ICU; pt. lying down in bed when [facility] arrived; pt. shared she was brought to [facility] by EMS yesterday after observing GI bleeding that did not resolve.  Pt. said she originally planned to wait until morning but decided to call EMS early this morning.  Pt. lives w/her husband who suffers from [place] but is otherwise in good health, as well as her daughter who provides care for husband.  Pt. has two cats and a 76 yo. black lab; Pt. seemed to be coping well and did not express feeling anxious; she is concerned this bleeding will come back again after it is treated; she believes the bleeding may be connected to her diverticulitis.  [place] made pt. aware of chaplains' availability; no further needs expressed at this time.        12/25/19 1100  Clinical Encounter Type  Visited With Patient  Visit Type Initial  Referral From  (Routine Rounding)  Spiritual Encounters  Spiritual Needs Emotional  Stress Factors  Patient Stress Factors Health changes

## 2019-12-25 NOTE — Progress Notes (Signed)
Patient currently has an NPO diet. Hassan Rowan, NP ordered for me to administer all 2200 scheduled medications with the exception of Ocuvite. Patient tolerated medications without incident. Will continue to monitor.

## 2019-12-25 NOTE — ED Notes (Signed)
ED TO INPATIENT HANDOFF REPORT  ED Nurse Name and Phone #:   Gershon Mussel RN [phone]  S Name/Age/Gender Jill Hudson 85 y.o. female Room/Bed: ED02A/ED02A  Code Status   Code Status: Full Code  Home/SNF/Other Home Patient oriented to: self, place, time and situation Is this baseline? Yes   Triage Complete: Triage complete  Chief Complaint GI bleeding [K92.2]  Triage Note Pt arrives to ED from home via [facility] with c/c of rectal bleeding beginning at approx 9pm last night. EMS reports transport vitals of 130/70, p70 NSR, O2 st 98% on room air. Upon arrival pt A&Ox4, NAD. Dr Beather Arbour at bedside.    Allergies Allergies  Allergen Reactions  . Saxagliptin Diarrhea  . Epinephrine Other (See Comments)    Patient does not remember what happens when she uses this  . Atorvastatin Other (See Comments)    Muscle aches  . Codeine Other (See Comments)    Upset stomach  . Ezetimibe Other (See Comments)    Myalgias(ZETIA)  . Limonene Rash    Patient does not recall this reaction  . Nitrofurantoin Rash and Other (See Comments)    Pruitus  . Sulfa Antibiotics Rash and Other (See Comments)    Sore mouth     Level of Care/Admitting Diagnosis ED Disposition    ED Disposition Condition [facility] Area: [place] [[id number]]  Level of Care: Stepdown [14]  Covid Evaluation: Asymptomatic Screening Protocol (No Symptoms)  Diagnosis: GI bleeding [147829]  Admitting Physician: Christel Mormon [[id number]]  Attending Physician: Christel Mormon [[id number]]  Estimated length of stay: past midnight tomorrow  Certification:: I certify this patient will need inpatient services for at least 2 midnights       B Medical/Surgery History Past Medical History:  Diagnosis Date  . Allergies   . Anxiety   . Arthritis    spine and shoulder  . Atherosclerosis of artery of extremity with rest pain ([place]) 10/12/2018  . Cancer ([place])    skin  . Diabetes mellitus without complication  ([place])   . Heart murmur   . Hyperlipidemia   . Hypertension   . Macula lutea degeneration   . Mitral and aortic valve disease   . Myocardial infarction [facility])    may have had a "light" heart attack  . Occasional tremors   . PAD (peripheral artery disease) ([place])   . Shingles    patient unaware but daughter confirms. it was a long time ago  . Stroke [facility]) 01/2017   may have had a slight stroke  . TIA (transient ischemic attack) 01/2017  . UTI (urinary tract infection)   . Vascular disease, peripheral [facility])    Past Surgical History:  Procedure Laterality Date  . CARDIAC CATHETERIZATION  1998   40% LM, 95% Ramus interm  . CATARACT EXTRACTION, BILATERAL    . COLONOSCOPY WITH PROPOFOL N/A 08/12/2018   Procedure: COLONOSCOPY WITH PROPOFOL;  Surgeon: Lucilla Lame, MD;  Location: [facility] ENDOSCOPY;  Service: Endoscopy;  Laterality: N/A;  . ENDARTERECTOMY FEMORAL Left 10/12/2018   Procedure: ENDARTERECTOMY FEMORAL;  Surgeon: Katha Cabal, MD;  Location: [facility] ORS;  Service: Vascular;  Laterality: Left;  angioplasty and left SFA stent placement  . EYE SURGERY Bilateral    cataract extractions  . LOWER EXTREMITY ANGIOGRAPHY Left 08/23/2017   Procedure: Lower Extremity Angiography;  Surgeon: Algernon Huxley, MD;  Location: [place] CV LAB;  Service: Cardiovascular;  Laterality: Left;  . LOWER EXTREMITY ANGIOGRAPHY  Left 07/26/2018   Procedure: LOWER EXTREMITY ANGIOGRAPHY;  Surgeon: Katha Cabal, MD;  Location: [place] CV LAB;  Service: Cardiovascular;  Laterality: Left;  . LOWER EXTREMITY ANGIOGRAPHY Left 09/16/2018   Procedure: LOWER EXTREMITY ANGIOGRAPHY;  Surgeon: Katha Cabal, MD;  Location: [place] CV LAB;  Service: Cardiovascular;  Laterality: Left;  . PTCA  08/2013   Left common iliac  . PTCA  12/2012   left ext iliac  . TUBAL LIGATION       A IV Location/Drains/Wounds Patient Lines/Drains/Airways Status   Active Line/Drains/Airways    Name:   Placement  date:   Placement time:   Site:   Days:   Peripheral IV 12/25/19 Right Antecubital   12/25/19    0434    Antecubital   less than 1   Peripheral IV 12/25/19 Left Forearm   12/25/19    0657    Forearm   less than 1   Sheath 09/16/18 Left Arterial   09/16/18    1109    Arterial   465   Sheath 09/16/18 Right Arterial;Femoral   09/16/18    1126    Arterial;Femoral   465   Incision (Closed) 10/12/18 Groin Left   10/12/18    1045     439          Intake/Output Last 24 hours No intake or output data in the 24 hours ending 12/25/19 4010  Labs/Imaging Results for orders placed or performed during the hospital encounter of 12/25/19 (from the past 48 hour(s))  Comprehensive metabolic panel     Status: Abnormal   Collection Time: 12/25/19  4:19 AM  Result Value Ref Range   Sodium 135 135 - 145 mmol/L   Potassium 4.5 3.5 - 5.1 mmol/L   Chloride 103 98 - 111 mmol/L   CO2 25 22 - 32 mmol/L   Glucose, Bld 170 (H) 70 - 99 mg/dL    Comment: Glucose reference range applies only to samples taken after fasting for at least 8 hours.   BUN 38 (H) 8 - 23 mg/dL   Creatinine, Ser 1.30 (H) 0.44 - 1.00 mg/dL   Calcium 8.6 (L) 8.9 - 10.3 mg/dL   Total Protein 6.0 (L) 6.5 - 8.1 g/dL   Albumin 3.5 3.5 - 5.0 g/dL   AST 18 15 - 41 U/L   ALT 14 0 - 44 U/L   Alkaline Phosphatase 63 38 - 126 U/L   Total Bilirubin 0.7 0.3 - 1.2 mg/dL   GFR calc non Af Amer 38 (L) >60 mL/min   GFR calc Af Amer 44 (L) >60 mL/min   Anion gap 7 5 - 15    Comment: Performed at [facility], [address] [place], [place] [address]  CBC WITH DIFFERENTIAL     Status: Abnormal   Collection Time: 12/25/19  4:19 AM  Result Value Ref Range   WBC 9.8 4.0 - 10.5 K/uL   RBC 2.64 (L) 3.87 - 5.11 MIL/uL   Hemoglobin 7.7 (L) 12.0 - 15.0 g/dL   HCT 24.2 (L) 36.0 - 46.0 %   MCV 91.7 80.0 - 100.0 fL   MCH 29.2 26.0 - 34.0 pg   MCHC 31.8 30.0 - 36.0 g/dL   RDW 12.9 11.5 - 15.5 %   Platelets 261 150 - 400 K/uL   nRBC 0.0 0.0 - 0.2  %   Neutrophils Relative % 73 %   Neutro Abs 7.2 1.7 - 7.7 K/uL   Lymphocytes  Relative 15 %   Lymphs Abs 1.4 0.7 - 4.0 K/uL   Monocytes Relative 9 %   Monocytes Absolute 0.9 0.1 - 1.0 K/uL   Eosinophils Relative 2 %   Eosinophils Absolute 0.2 0.0 - 0.5 K/uL   Basophils Relative 0 %   Basophils Absolute 0.0 0.0 - 0.1 K/uL   Immature Granulocytes 1 %   Abs Immature Granulocytes 0.06 0.00 - 0.07 K/uL    Comment: Performed at [facility], [address] [place], [place] [address]  Protime-INR     Status: None   Collection Time: 12/25/19  4:19 AM  Result Value Ref Range   Prothrombin Time 13.8 11.4 - 15.2 seconds   INR 1.1 0.8 - 1.2    Comment: (NOTE) INR goal varies based on device and disease states. Performed at [facility], [address] [place], [place] [address]   Type and screen [place]     Status: None (Preliminary result)   Collection Time: 12/25/19  4:19 AM  Result Value Ref Range   ABO/RH(D) A POS    Antibody Screen NEG    Sample Expiration      12/28/2019,2359 Performed at [facility], [address], [place], [place] [address]    Unit Number [id number]    Blood Component Type RED CELLS,LR    Unit division 00    Status of Unit ALLOCATED    Transfusion Status OK TO TRANSFUSE    Crossmatch Result Compatible    Unit Number [id number]    Blood Component Type RED CELLS,LR    Unit division 00    Status of Unit ALLOCATED    Transfusion Status OK TO TRANSFUSE    Crossmatch Result Compatible   Respiratory Panel by RT PCR (Flu A&B, Covid) - Nasopharyngeal Swab     Status: None   Collection Time: 12/25/19  4:19 AM   Specimen: Nasopharyngeal Swab  Result Value Ref Range   SARS Coronavirus 2 by RT PCR NEGATIVE NEGATIVE    Comment: (NOTE) SARS-CoV-2 target nucleic acids are NOT DETECTED. The SARS-CoV-2 RNA is generally detectable in upper respiratoy specimens during the acute phase of infection. The  lowest concentration of SARS-CoV-2 viral copies this assay can detect is 131 copies/mL. A negative result does not preclude SARS-Cov-2 infection and should not be used as the sole basis for treatment or other patient management decisions. A negative result may occur with  improper specimen collection/handling, submission of specimen other than nasopharyngeal swab, presence of viral mutation(s) within the areas targeted by this assay, and inadequate number of viral copies (<131 copies/mL). A negative result must be combined with clinical observations, patient history, and epidemiological information. The expected result is Negative. Fact Sheet for Patients:  PinkCheek.be Fact Sheet for Healthcare Providers:  GravelBags.it This test is not yet ap proved or cleared by the [place] FDA and  has been authorized for detection and/or diagnosis of SARS-CoV-2 by FDA under an Emergency Use Authorization (EUA). This EUA will remain  in effect (meaning this test can be used) for the duration of the COVID-19 declaration under Section 564(b)(1) of the Act, 21 U.S.C. section 360bbb-3(b)(1), unless the authorization is terminated or revoked sooner.    Influenza A by PCR NEGATIVE NEGATIVE   Influenza B by PCR NEGATIVE NEGATIVE    Comment: (NOTE) The Xpert Xpress SARS-CoV-2/FLU/RSV assay is intended as an aid in  the diagnosis of influenza from Nasopharyngeal swab specimens and  should not be used as  a sole basis for treatment. Nasal washings and  aspirates are unacceptable for Xpert Xpress SARS-CoV-2/FLU/RSV  testing. Fact Sheet for Patients: PinkCheek.be Fact Sheet for Healthcare Providers: GravelBags.it This test is not yet approved or cleared by the [place] FDA and  has been authorized for detection and/or diagnosis of SARS-CoV-2 by  FDA under an Emergency Use  Authorization (EUA). This EUA will remain  in effect (meaning this test can be used) for the duration of the  Covid-19 declaration under Section 564(b)(1) of the Act, 21  U.S.C. section 360bbb-3(b)(1), unless the authorization is  terminated or revoked. Performed at [facility], [place], [place] [address]   Troponin I (High Sensitivity)     Status: None   Collection Time: 12/25/19  4:19 AM  Result Value Ref Range   Troponin I (High Sensitivity) 12 <18 ng/L    Comment: (NOTE) Elevated high sensitivity troponin I (hsTnI) values and significant  changes across serial measurements may suggest ACS but many other  chronic and acute conditions are known to elevate hsTnI results.  Refer to the "Links" section for chest pain algorithms and additional  guidance. Performed at [facility], [address] [place], [place] [address]   Prepare RBC     Status: None   Collection Time: 12/25/19  5:30 AM  Result Value Ref Range   Order Confirmation      ORDER PROCESSED BY BLOOD BANK Performed at [facility], [address] [place], [place] [address]   Prepare RBC     Status: None   Collection Time: 12/25/19  6:49 AM  Result Value Ref Range   Order Confirmation      ORDER PROCESSED BY BLOOD BANK Performed at [facility], [address], [place], [place] [address]    DG Chest Port 1 View  Result Date: 12/25/2019 CLINICAL DATA:  Rectal bleeding. EXAM: PORTABLE CHEST 1 VIEW COMPARISON:  Two-view chest x-ray 11/02/2018 FINDINGS: The heart size is normal. Atherosclerotic changes are noted at the aortic arch. No edema or effusion is present. No focal airspace disease is present. Axial skeleton is unremarkable. IMPRESSION: 1. No acute cardiopulmonary disease. 2. Aortic atherosclerosis. Electronically Signed   By: San Morelle M.D.   On: 12/25/2019 04:35    Pending Labs Unresulted Labs (From admission, onward)    Start     Ordered    12/26/19 0500  Comprehensive metabolic panel  Tomorrow morning,   STAT     12/25/19 0555   12/26/19 0500  CBC  Tomorrow morning,   STAT     12/25/19 0555   12/25/19 0555  Hemoglobin and hematocrit, blood  Now then every 6 hours,   STAT     12/25/19 0555   12/25/19 0554  APTT  Once,   STAT     12/25/19 0555   12/25/19 0552  CBC WITH DIFFERENTIAL  Once,   STAT     12/25/19 0555   12/25/19 0552  TSH  Once,   STAT     12/25/19 0555          Vitals/Pain Today's Vitals   12/25/19 0412 12/25/19 0500 12/25/19 0530  BP:  (!) 156/69 (!) 163/44  Pulse:  73 73  Resp:  20 20  SpO2:  100% 99%  Weight: 52.6 kg    Height: 5\' 1"  (1.549 m)    PainSc: 0-No pain      Isolation Precautions No active isolations  Medications Medications  0.9 %  sodium chloride infusion (has no administration in time range)  diltiazem (CARDIZEM CD) 24 hr capsule 180 mg (has no administration in time range)  nitroGLYCERIN (NITROSTAT) SL tablet 0.4 mg (has no administration in time range)  rosuvastatin (CRESTOR) tablet 5 mg (has no administration in time range)  insulin glargine (LANTUS) injection 10 Units (has no administration in time range)  Cyanocobalamin TBCR 1,000 mcg (has no administration in time range)  ferrous sulfate tablet 325 mg (has no administration in time range)  baclofen (LIORESAL) tablet 10 mg (has no administration in time range)  gabapentin (NEURONTIN) capsule 100 mg (has no administration in time range)  PreserVision AREDS CAPS (has no administration in time range)  0.9 %  sodium chloride infusion (has no administration in time range)  acetaminophen (TYLENOL) tablet 650 mg (has no administration in time range)    Or  acetaminophen (TYLENOL) suppository 650 mg (has no administration in time range)  traZODone (DESYREL) tablet 25 mg (has no administration in time range)  ondansetron (ZOFRAN) tablet 4 mg (has no administration in time range)    Or  ondansetron (ZOFRAN) injection 4 mg (has  no administration in time range)  0.9 %  sodium chloride infusion (has no administration in time range)  sodium chloride 0.9 % bolus 1,000 mL (1,000 mLs Intravenous New Bag/Given 12/25/19 0456)    Mobility walks Low fall risk   Focused Assessments Cardiac Assessment Handoff:    Lab Results  Component Value Date   CKTOTAL 63 05/03/2013   CKMB 1.0 05/03/2013   TROPONINI <0.03 11/02/2018   No results found for: DDIMER Does the Patient currently have chest pain? No     R Recommendations: See Admitting Provider Note  Report given to:   Additional Notes:

## 2019-12-25 NOTE — Progress Notes (Addendum)
PROGRESS NOTE    Jill Hudson  OBS:[id number] DOB: 1934-10-27 DOA: 12/25/2019 PCP: [name], MD     Assessment & Plan:   Active Problems:   GI bleeding  GI bleeding: ddx: diverticular disease, bleeding masses, AVMs, upper GI etiology as in gastritis, duodenitis, gastric or duodenal ulcers erosions or AVMs. Continue on IV protonix. S/p 2 units of PRBCs transfused. Will continue to monitor H&H. GI consulted & recs tagged RBC scan if pt continues to bleed. So far pt has had 5 bloody BMs, tagged RBCs scan ordered. Continue to hold aspirin & plavix  Acute blood loss anemia: secondary to above. Continue to monitor H&H closely. S/p 2 units of PRBCs transfused.  AKI: continue on IVFs. Baseline Cr/GFR is unknown. Continue to hold home dose of cozaar and chlorthalidone.  Hypertension: continue to hold amlodipine, diltiazem, chlorthalidone, cozaar  DM2: continue on SSI w/ accuchecks. Continue to hold home dose of metformin   Dyslipidemia: continue on statin  CAD: stable. Continue on statin & nitro prn   Peripheral neuropathy: continue on home dose of neurontin.  DVT prophylaxis: SCDs Code Status: full  Family Communication:  Disposition Plan: will likely need several days more as pt is still having bloody BM    Consultants:   GI   Procedures:    Antimicrobials:    Subjective: Pt c/o abd pain   Objective: Vitals:   12/25/19 0500 12/25/19 0530 12/25/19 0721 12/25/19 0809  BP: (!) 156/69 (!) 163/44  (!) 146/44  Pulse: 73 73    Resp: 20 20    Temp:   98.4 F (36.9 C) 98.1 F (36.7 C)  TempSrc:   Oral Oral  SpO2: 100% 99%    Weight:   57.2 kg   Height:   5\' 1"  (1.549 m)    No intake or output data in the 24 hours ending 12/25/19 0824 Filed Weights   12/25/19 0412 12/25/19 0721  Weight: 52.6 kg 57.2 kg    Examination:  General exam: Appears calm and comfortable  Respiratory system: Clear to auscultation. Respiratory effort normal. No  wheezes Cardiovascular system: S1 & S2 +. No rubs, gallops or clicks.  Gastrointestinal system: Abdomen is nondistended, soft and tenderness to palpation. hypoactive bowel sounds heard. Central nervous system: Alert and oriented.Moves all 4 extremities Psychiatry: Judgement and insight appear normal. Flat mood and affect    Data Reviewed: I have personally reviewed following labs and imaging studies  CBC: Recent Labs  Lab 12/25/19 0419 12/25/19 0731  WBC 9.8 10.4  NEUTROABS 7.2 7.9*  HGB 7.7* 7.1*  HCT 24.2* 22.3*  MCV 91.7 91.4  PLT 261 476   Basic Metabolic Panel: Recent Labs  Lab 12/25/19 0419  NA 135  K 4.5  CL 103  CO2 25  GLUCOSE 170*  BUN 38*  CREATININE 1.30*  CALCIUM 8.6*   GFR: Estimated Creatinine Clearance: 24.7 mL/min (A) (by C-G formula based on SCr of 1.3 mg/dL (H)). Liver Function Tests: Recent Labs  Lab 12/25/19 0419  AST 18  ALT 14  ALKPHOS 63  BILITOT 0.7  PROT 6.0*  ALBUMIN 3.5   No results for input(s): LIPASE, AMYLASE in the last 168 hours. No results for input(s): AMMONIA in the last 168 hours. Coagulation Profile: Recent Labs  Lab 12/25/19 0419  INR 1.1   Cardiac Enzymes: No results for input(s): CKTOTAL, CKMB, CKMBINDEX, TROPONINI in the last 168 hours. BNP (last 3 results) No results for input(s): PROBNP in the last 8760 hours.  HbA1C: No results for input(s): HGBA1C in the last 72 hours. CBG: Recent Labs  Lab 12/25/19 0722  GLUCAP 180*   Lipid Profile: No results for input(s): CHOL, HDL, LDLCALC, TRIG, CHOLHDL, LDLDIRECT in the last 72 hours. Thyroid Function Tests: Recent Labs    12/25/19 0731  TSH 3.512   Anemia Panel: No results for input(s): VITAMINB12, FOLATE, FERRITIN, TIBC, IRON, RETICCTPCT in the last 72 hours. Sepsis Labs: No results for input(s): PROCALCITON, LATICACIDVEN in the last 168 hours.  Recent Results (from the past 240 hour(s))  Respiratory Panel by RT PCR (Flu A&B, Covid) - Nasopharyngeal  Swab     Status: None   Collection Time: 12/25/19  4:19 AM   Specimen: Nasopharyngeal Swab  Result Value Ref Range Status   SARS Coronavirus 2 by RT PCR NEGATIVE NEGATIVE Final    Comment: (NOTE) SARS-CoV-2 target nucleic acids are NOT DETECTED. The SARS-CoV-2 RNA is generally detectable in upper respiratoy specimens during the acute phase of infection. The lowest concentration of SARS-CoV-2 viral copies this assay can detect is 131 copies/mL. A negative result does not preclude SARS-Cov-2 infection and should not be used as the sole basis for treatment or other patient management decisions. A negative result may occur with  improper specimen collection/handling, submission of specimen other than nasopharyngeal swab, presence of viral mutation(s) within the areas targeted by this assay, and inadequate number of viral copies (<131 copies/mL). A negative result must be combined with clinical observations, patient history, and epidemiological information. The expected result is Negative. Fact Sheet for Patients:  PinkCheek.be Fact Sheet for Healthcare Providers:  GravelBags.it This test is not yet ap proved or cleared by the [place] FDA and  has been authorized for detection and/or diagnosis of SARS-CoV-2 by FDA under an Emergency Use Authorization (EUA). This EUA will remain  in effect (meaning this test can be used) for the duration of the COVID-19 declaration under Section 564(b)(1) of the Act, 21 U.S.C. section 360bbb-3(b)(1), unless the authorization is terminated or revoked sooner.    Influenza A by PCR NEGATIVE NEGATIVE Final   Influenza B by PCR NEGATIVE NEGATIVE Final    Comment: (NOTE) The Xpert Xpress SARS-CoV-2/FLU/RSV assay is intended as an aid in  the diagnosis of influenza from Nasopharyngeal swab specimens and  should not be used as a sole basis for treatment. Nasal washings and  aspirates are  unacceptable for Xpert Xpress SARS-CoV-2/FLU/RSV  testing. Fact Sheet for Patients: PinkCheek.be Fact Sheet for Healthcare Providers: GravelBags.it This test is not yet approved or cleared by the [place] FDA and  has been authorized for detection and/or diagnosis of SARS-CoV-2 by  FDA under an Emergency Use Authorization (EUA). This EUA will remain  in effect (meaning this test can be used) for the duration of the  Covid-19 declaration under Section 564(b)(1) of the Act, 21  U.S.C. section 360bbb-3(b)(1), unless the authorization is  terminated or revoked. Performed at [facility], [address], [place], [place] [address]          Radiology Studies: [facility] Chest [place] 1 View  Result Date: 12/25/2019 CLINICAL DATA:  Rectal bleeding. EXAM: PORTABLE CHEST 1 VIEW COMPARISON:  Two-view chest x-ray 11/02/2018 FINDINGS: The heart size is normal. Atherosclerotic changes are noted at the aortic arch. No edema or effusion is present. No focal airspace disease is present. Axial skeleton is unremarkable. IMPRESSION: 1. No acute cardiopulmonary disease. 2. Aortic atherosclerosis. Electronically Signed   By: San Morelle M.D.   On:  12/25/2019 04:35        Scheduled Meds: . baclofen  10 mg Oral QHS  . diltiazem  180 mg Oral BID  . ferrous sulfate  325 mg Oral Q breakfast  . gabapentin  100 mg Oral QHS  . insulin glargine  10 Units Subcutaneous Daily  . multivitamin-lutein   Oral BID  . [START ON 12/28/2019] pantoprazole  40 mg Intravenous Q12H  . [START ON 12/26/2019] rosuvastatin  5 mg Oral QODAY  . vitamin B-12  1,000 mcg Oral Daily   Continuous Infusions: . sodium chloride    . sodium chloride    . sodium chloride    . pantoprozole (PROTONIX) infusion 8 mg/hr (12/25/19 0805)     LOS: 0 days    Time spent: 30 mins    Wyvonnia Dusky, MD [facility] Pager [phone]  If 7PM-7AM, please  contact night-coverage [email or web address] 12/25/2019, 8:24 AM

## 2019-12-25 NOTE — ED Provider Notes (Signed)
[facility] Emergency Department Provider Note   ____________________________________________   First MD Initiated Contact with Patient 12/25/19 0408     (approximate)  I have reviewed the triage vital signs and the nursing notes.   HISTORY  Chief Complaint GI bleed   HPI Jill Hudson is a 85 y.o. female brought to the ED via EMS from home with a chief complaint of rectal bleeding.  Patient got up to have a bowel movement and noted blood mixed with stool.  Blood clots noted.  No prior history of GI bleed.  History of atrial fibrillation, carotid artery stenosis; takes Plavix.  Feeling lightheaded at home but no syncope.  Denies fever, cough, chest pain, shortness of breath, abdominal pain, nausea or vomiting.       Past Medical History:  Diagnosis Date  . Allergies   . Anxiety   . Arthritis    spine and shoulder  . Atherosclerosis of artery of extremity with rest pain ([place]) 10/12/2018  . Cancer ([place])    skin  . Diabetes mellitus without complication ([place])   . Heart murmur   . Hyperlipidemia   . Hypertension   . Macula lutea degeneration   . Mitral and aortic valve disease   . Myocardial infarction [facility])    may have had a "light" heart attack  . Occasional tremors   . PAD (peripheral artery disease) ([place])   . Shingles    patient unaware but daughter confirms. it was a long time ago  . Stroke [facility]) 01/2017   may have had a slight stroke  . TIA (transient ischemic attack) 01/2017  . UTI (urinary tract infection)   . Vascular disease, peripheral [facility])     Patient Active Problem List   Diagnosis Date Noted  . Age-related macular degeneration, dry, left eye 06/21/2019  . Age-related macular degeneration, wet, right eye ([place]) 06/21/2019  . Moderate nonproliferative diabetic retinopathy associated with type 2 diabetes mellitus ([place]) 06/21/2019  . Lumbosacral radiculopathy at L4 05/01/2019  . Underweight 05/01/2019  . Encounter for long-term  (current) use of aspirin 10/31/2018  . Encounter for long-term (current) use of antiplatelets/antithrombotics 10/31/2018  . Long term current use of oral hypoglycemic drug 10/31/2018  . Encounter for long-term (current) use of insulin ([place]) 10/31/2018  . GIB (gastrointestinal bleeding) 08/11/2018  . Leg pain 07/03/2017  . Carpal tunnel syndrome on both sides 05/05/2017  . Myalgia due to HMG CoA reductase inhibitor 05/05/2017  . History of CVA (cerebrovascular accident) 02/15/2017  . Degenerative disc disease, lumbar 12/21/2016  . Atherosclerosis of native arteries of extremity with intermittent claudication ([place]) 12/20/2016  . Bilateral carotid artery stenosis 12/20/2016  . Hip bursitis 05/18/2016  . Elevated TSH 01/18/2016  . Chronic renal insufficiency, stage III (moderate) 01/16/2016  . Senile ecchymosis 01/16/2016  . Type II diabetes mellitus with complication ([place]) 82/99/3716  . GERD (gastroesophageal reflux disease) 07/24/2015  . PAD (peripheral artery disease) ([place]) 05/17/2015  . Neoplasm of uncertain behavior of skin 05/17/2015  . Retinopathy, diabetic, proliferative ([place]) 04/11/2015  . Mixed hyperlipidemia 04/11/2015  . Essential (primary) hypertension 04/11/2015  . Generalized OA 04/11/2015  . Arteriosclerosis of coronary artery 05/29/2013  . Hypertensive heart disease without CHF 05/29/2013    Past Surgical History:  Procedure Laterality Date  . CARDIAC CATHETERIZATION  1998   40% LM, 95% Ramus interm  . CATARACT EXTRACTION, BILATERAL    . COLONOSCOPY WITH PROPOFOL N/A 08/12/2018   Procedure: COLONOSCOPY WITH PROPOFOL;  Surgeon: Allen Norris,  Darren, MD;  Location: [place] ENDOSCOPY;  Service: Endoscopy;  Laterality: N/A;  . ENDARTERECTOMY FEMORAL Left 10/12/2018   Procedure: ENDARTERECTOMY FEMORAL;  Surgeon: Katha Cabal, MD;  Location: [facility] ORS;  Service: Vascular;  Laterality: Left;  angioplasty and left SFA stent placement  . EYE SURGERY Bilateral    cataract  extractions  . LOWER EXTREMITY ANGIOGRAPHY Left 08/23/2017   Procedure: Lower Extremity Angiography;  Surgeon: Algernon Huxley, MD;  Location: [place] CV LAB;  Service: Cardiovascular;  Laterality: Left;  . LOWER EXTREMITY ANGIOGRAPHY Left 07/26/2018   Procedure: LOWER EXTREMITY ANGIOGRAPHY;  Surgeon: Katha Cabal, MD;  Location: [place] CV LAB;  Service: Cardiovascular;  Laterality: Left;  . LOWER EXTREMITY ANGIOGRAPHY Left 09/16/2018   Procedure: LOWER EXTREMITY ANGIOGRAPHY;  Surgeon: Katha Cabal, MD;  Location: [place] CV LAB;  Service: Cardiovascular;  Laterality: Left;  . PTCA  08/2013   Left common iliac  . PTCA  12/2012   left ext iliac  . TUBAL LIGATION      Prior to Admission medications   Medication Sig Start Date End Date Taking? Authorizing Provider  acetaminophen (TYLENOL) 650 MG CR tablet Take 1,300 mg by mouth every 8 (eight) hours as needed for pain.    [provider]  amLODipine (NORVASC) 2.5 MG tablet Take 2.5 mg by mouth daily. 05/20/18   [provider]  aspirin 81 MG tablet Take 81 mg by mouth daily.     [provider]  B-D ULTRAFINE III SHORT PEN 31G X 8 MM MISC USE AS DIRECTED 12/19/18   [name], MD  baclofen (LIORESAL) 10 MG tablet TAKE 1 TABLET(10 MG) BY MOUTH THREE TIMES DAILY Patient taking differently: Take 10 mg by mouth at bedtime.  02/09/18   [name], MD  chlorthalidone (HYGROTON) 25 MG tablet Take 25 mg by mouth daily.    [provider]  clindamycin (CLEOCIN) 2 % vaginal cream Place 1 Applicatorful vaginally at bedtime. 10/02/19   [name], MD  clopidogrel (PLAVIX) 75 MG tablet TAKE 1 TABLET BY MOUTH EVERY DAY 09/15/19   Schnier, Dolores Lory, MD  Cyanocobalamin (RA VITAMIN B-12 TR) 1000 MCG TBCR Take 1,000 mcg by mouth daily.     [provider]  diltiazem (CARTIA XT) 180 MG 24 hr capsule Take 180 mg by mouth 2 (two) times daily.     [provider]    ferrous sulfate 325 (65 FE) MG tablet Take 325 mg by mouth daily with breakfast.    [provider]  gabapentin (NEURONTIN) 100 MG capsule TAKE 1 TO 2 CAPSULES(100 TO 200 MG) BY MOUTH AT BEDTIME 08/30/19   [name], MD  insulin aspart (NOVOLOG FLEXPEN) 100 UNIT/ML FlexPen Inject 3 Units into the skin 3 (three) times daily with meals. At lunch Patient taking differently: Inject 2 Units into the skin 3 (three) times daily with meals. At breakfast 01/18/19   [name], MD  Insulin Glargine [facility]) 100 UNIT/ML SOPN Inject 0.12 mLs (12 Units total) into the skin daily. 06/27/19   [name], MD  losartan (COZAAR) 50 MG tablet Take 50 mg by mouth 2 (two) times daily.  06/20/18   [provider]  metFORMIN (GLUCOPHAGE-XR) 500 MG 24 hr tablet TAKE 1 TABLET BY MOUTH TWICE DAILY 03/25/19   [name], MD  Multiple Vitamins-Minerals (PRESERVISION AREDS PO) Take 1 capsule by mouth 2 (two) times daily.     [provider]  nitroGLYCERIN (NITROSTAT) 0.4 MG SL tablet 1 tab under the tongue for CP, may repeat in 5 minutes up to 3 doses 09/07/18   [name], MD  rosuvastatin (CRESTOR) 5 MG tablet Take 5 mg by mouth every other day.  03/02/18   [provider]  traMADol (ULTRAM) 50 MG tablet Take 1 tablet (50 mg total) by mouth every 8 (eight) hours as needed. Patient not taking: Reported on 11/30/2019 11/25/18   Coral Spikes, DO    Allergies Saxagliptin, Epinephrine, Atorvastatin, Codeine, Ezetimibe, Limonene, Nitrofurantoin, and Sulfa antibiotics  Family History  Problem Relation Age of Onset  . Dementia Mother   . Diabetes Father     Social History Social History   Tobacco Use  . Smoking status: Former Smoker    Packs/day: 2.00    Years: 37.00    Pack years: 74.00    Types: Cigarettes    Quit date: 1980    Years since quitting: 41.1  . Smokeless tobacco: Never Used  . Tobacco comment: smoking cessation materials not  required  Substance Use Topics  . Alcohol use: No    Alcohol/week: 0.0 standard drinks  . Drug use: No    Review of Systems  Constitutional: No fever/chills Eyes: No visual changes. ENT: No sore throat. Cardiovascular: Denies chest pain. Respiratory: Denies shortness of breath. Gastrointestinal: Positive for rectal bleeding.  No abdominal pain.  No nausea, no vomiting.  No diarrhea.  No constipation. Genitourinary: Negative for dysuria. Musculoskeletal: Negative for back pain. Skin: Negative for rash. Neurological: Negative for headaches, focal weakness or numbness.   ____________________________________________   PHYSICAL EXAM:  VITAL SIGNS: ED Triage Vitals  Enc Vitals Group     BP      Pulse      Resp      Temp      Temp src      SpO2      Weight      Height      Head Circumference      Peak Flow      Pain Score      Pain Loc      Pain Edu?      Excl. in [place]?     Constitutional: Alert and oriented. Well appearing and in mild acute distress. Eyes: Conjunctivae are normal. PERRL. EOMI. Head: Atraumatic. Nose: No congestion/rhinnorhea. Mouth/Throat: Mucous membranes are moist.  Oropharynx non-erythematous. Neck: No stridor.   Cardiovascular: Normal rate, regular rhythm. Grossly normal heart sounds.  Good peripheral circulation. Respiratory: Normal respiratory effort.  No retractions. Lungs CTAB. Gastrointestinal: Soft and nontender to light and deep palpation. No distention. No abdominal bruits. No CVA tenderness. Musculoskeletal: No lower extremity tenderness nor edema.  No joint effusions. Neurologic:  Normal speech and language. No gross focal neurologic deficits are appreciated. No gait instability. Skin:  Skin is warm, dry and intact. No rash noted. Psychiatric: Mood and affect are normal. Speech and behavior are normal.  ____________________________________________   LABS (all labs ordered are listed, but only abnormal results are displayed)  Labs  Reviewed  CBC WITH DIFFERENTIAL/PLATELET - Abnormal; Notable for the following components:      Result Value   RBC 2.64 (*)    Hemoglobin 7.7 (*)    HCT 24.2 (*)    All other components within normal limits  RESPIRATORY PANEL BY RT PCR (FLU A&B, COVID)  PROTIME-INR  COMPREHENSIVE METABOLIC PANEL  TYPE AND SCREEN  PREPARE RBC (CROSSMATCH)  TROPONIN I (HIGH  SENSITIVITY)   ____________________________________________  EKG  ED ECG REPORT I, Alfrieda Tarry J, the attending physician, personally viewed and interpreted this ECG.   Date: 12/25/2019  EKG Time: 0422  Rate: 71  Rhythm: normal EKG, normal sinus rhythm  Axis: Normal  Intervals:none  ST&T Change: Nonspecific  ____________________________________________  RADIOLOGY  ED MD interpretation: No acute cardiopulmonary process  Official radiology report(s): DG Chest Port 1 View  Result Date: 12/25/2019 CLINICAL DATA:  Rectal bleeding. EXAM: PORTABLE CHEST 1 VIEW COMPARISON:  Two-view chest x-ray 11/02/2018 FINDINGS: The heart size is normal. Atherosclerotic changes are noted at the aortic arch. No edema or effusion is present. No focal airspace disease is present. Axial skeleton is unremarkable. IMPRESSION: 1. No acute cardiopulmonary disease. 2. Aortic atherosclerosis. Electronically Signed   By: San Morelle M.D.   On: 12/25/2019 04:35    ____________________________________________   PROCEDURES  Procedure(s) performed (including Critical Care):  Procedures  CRITICAL CARE Performed by: Paulette Blanch   Total critical care time: 30 minutes  Critical care time was exclusive of separately billable procedures and treating other patients.  Critical care was necessary to treat or prevent imminent or life-threatening deterioration.  Critical care was time spent personally by me on the following activities: development of treatment plan with patient and/or surrogate as well as nursing, discussions with consultants,  evaluation of patient's response to treatment, examination of patient, obtaining history from patient or surrogate, ordering and performing treatments and interventions, ordering and review of laboratory studies, ordering and review of radiographic studies, pulse oximetry and re-evaluation of patient's condition.  ____________________________________________   INITIAL IMPRESSION / ASSESSMENT AND PLAN / ED COURSE  As part of my medical decision making, I reviewed the following data within the [place] notes reviewed and incorporated, Labs reviewed, EKG interpreted, Old chart reviewed, Radiograph reviewed, Discussed with admitting physician and Notes from prior ED visits     LESLEA VOWLES was evaluated in Emergency Department on 12/25/2019 for the symptoms described in the history of present illness. She was evaluated in the context of the global COVID-19 pandemic, which necessitated consideration that the patient might be at risk for infection with the SARS-CoV-2 virus that causes COVID-19. Institutional protocols and algorithms that pertain to the evaluation of patients at risk for COVID-19 are in a state of rapid change based on information released by regulatory bodies including the CDC and federal and state organizations. These policies and algorithms were followed during the patient's care in the ED.    85 year old female who presents with rectal bleeding.  Differential diagnosis includes but is not limited to diverticular bleed, AVM, PUD, etc.  Gross hematochezia noted on exam.  Will place 2 large-bore IVs, initiate IV fluid resuscitation, type and screen.  Anticipate hospitalization.   Clinical Course as of Dec 24 601  Mon Dec 25, 2019  0500 H/H noted.  1 unit PRBC ordered for transfusion.  Looks like patient was anemic back in December 2019 (7.4/23.3) which was around the time she had carotid endarterectomy.   [JS]  0518 BP 156/69; HR 70   [JS]    Clinical  Course User Index [JS] Paulette Blanch, MD     ____________________________________________   FINAL CLINICAL IMPRESSION(S) / ED DIAGNOSES  Final diagnoses:  GI bleed  Rectal bleeding     ED Discharge Orders    None       Note:  This document was prepared using Dragon voice recognition software and may include unintentional dictation  errors.   Paulette Blanch, MD 12/25/19 [phone]

## 2019-12-25 NOTE — Consult Note (Signed)
Lucilla Lame, MD [facility]  [address] [place], [place] [address] Phone: [phone] Fax : [phone]  Consultation  Referring Provider:     Dr. Sidney Ace Primary Care Physician:  [name], MD Primary Gastroenterologist:     Dr. Allen Norris      Reason for Consultation:     Hematochezia  Date of Admission:  12/25/2019 Date of Consultation:  12/25/2019         HPI:   Jill Hudson is a 85 y.o. female who was admitted with rectal bleeding.  The patient had a colonoscopy by me a year and 5 months ago that showed diverticulosis.  At that time the patient was having a colonoscopy for the same reason of GI bleeding. The patient has a history of having a hemoglobin of of 7.4 back in December 2019 and 9.7 in January 2020.  On admission the patient's hemoglobin was 7.7 which went down to 7.1 this morning. The patient reports some abdominal cramping that she thinks may be related to the bleeding but denies any overt abdominal pain.  She had a creatinine of 1.7 with a BUN of 22 on admission that went up to a creatinine of 1.3 with a BUN of 38 this morning. When I saw the patient this morning she denied any further signs of any GI bleeding.  She reported that she was feeling well this morning.  Shortly after I left the department I was texted by the hospitalist that the patient had a large bloody bowel movement.  The patient's vital signs stayed stable during that episode.  Subsequent to that the patient had a very small amount of passage of stool.  Past Medical History:  Diagnosis Date  . Allergies   . Anxiety   . Arthritis    spine and shoulder  . Atherosclerosis of artery of extremity with rest pain ([place]) 10/12/2018  . Cancer ([place])    skin  . Diabetes mellitus without complication ([place])   . Heart murmur   . Hyperlipidemia   . Hypertension   . Macula lutea degeneration   . Mitral and aortic valve disease   . Myocardial infarction [facility])    may have had a "light" heart attack  .  Occasional tremors   . PAD (peripheral artery disease) ([place])   . Shingles    patient unaware but daughter confirms. it was a long time ago  . Stroke [facility]) 01/2017   may have had a slight stroke  . TIA (transient ischemic attack) 01/2017  . UTI (urinary tract infection)   . Vascular disease, peripheral [facility])     Past Surgical History:  Procedure Laterality Date  . CARDIAC CATHETERIZATION  1998   40% LM, 95% Ramus interm  . CATARACT EXTRACTION, BILATERAL    . COLONOSCOPY WITH PROPOFOL N/A 08/12/2018   Procedure: COLONOSCOPY WITH PROPOFOL;  Surgeon: Lucilla Lame, MD;  Location: [facility] ENDOSCOPY;  Service: Endoscopy;  Laterality: N/A;  . ENDARTERECTOMY FEMORAL Left 10/12/2018   Procedure: ENDARTERECTOMY FEMORAL;  Surgeon: Katha Cabal, MD;  Location: [facility] ORS;  Service: Vascular;  Laterality: Left;  angioplasty and left SFA stent placement  . EYE SURGERY Bilateral    cataract extractions  . LOWER EXTREMITY ANGIOGRAPHY Left 08/23/2017   Procedure: Lower Extremity Angiography;  Surgeon: Algernon Huxley, MD;  Location: [place] CV LAB;  Service: Cardiovascular;  Laterality: Left;  . LOWER EXTREMITY ANGIOGRAPHY Left 07/26/2018   Procedure: LOWER EXTREMITY ANGIOGRAPHY;  Surgeon: Katha Cabal, MD;  Location: [place] CV LAB;  Service: Cardiovascular;  Laterality: Left;  . LOWER EXTREMITY ANGIOGRAPHY Left 09/16/2018   Procedure: LOWER EXTREMITY ANGIOGRAPHY;  Surgeon: Katha Cabal, MD;  Location: [place] CV LAB;  Service: Cardiovascular;  Laterality: Left;  . PTCA  08/2013   Left common iliac  . PTCA  12/2012   left ext iliac  . TUBAL LIGATION      Prior to Admission medications   Medication Sig Start Date End Date Taking? Authorizing Provider  acetaminophen (TYLENOL) 650 MG CR tablet Take 1,300 mg by mouth every 8 (eight) hours as needed for pain.   Yes [provider]  amLODipine (NORVASC) 2.5 MG tablet Take 5 mg by mouth daily.  05/20/18  Yes [provider]  aspirin 81 MG tablet Take 81 mg by mouth daily.    Yes [provider]  baclofen (LIORESAL) 10 MG tablet TAKE 1 TABLET(10 MG) BY MOUTH THREE TIMES DAILY Patient taking differently: Take 10 mg by mouth at bedtime.  02/09/18  Yes [name], MD  chlorthalidone (HYGROTON) 25 MG tablet Take 25 mg by mouth daily.   Yes [provider]  clopidogrel (PLAVIX) 75 MG tablet TAKE 1 TABLET BY MOUTH EVERY DAY 09/15/19  Yes Schnier, Dolores Lory, MD  Cyanocobalamin (RA VITAMIN B-12 TR) 1000 MCG TBCR Take 1,000 mcg by mouth daily.    Yes [provider]  diltiazem (CARTIA XT) 180 MG 24 hr capsule Take 180 mg by mouth 2 (two) times daily.    Yes [provider]  ferrous sulfate 325 (65 FE) MG tablet Take 325 mg by mouth daily with breakfast.   Yes [provider]  gabapentin (NEURONTIN) 100 MG capsule TAKE 1 TO 2 CAPSULES(100 TO 200 MG) BY MOUTH AT BEDTIME Patient taking differently: Take 100-200 mg by mouth at bedtime.  08/30/19  Yes [name], MD  insulin aspart (NOVOLOG FLEXPEN) 100 UNIT/ML FlexPen Inject 3 Units into the skin 3 (three) times daily with meals. At lunch Patient taking differently: Inject 2 Units into the skin daily. At breakfast 01/18/19  Yes [name], MD  Insulin Glargine (BASAGLAR KWIKPEN) 100 UNIT/ML SOPN Inject 0.12 mLs (12 Units total) into the skin daily. 06/27/19  Yes [name], MD  losartan (COZAAR) 50 MG tablet Take 50 mg by mouth 2 (two) times daily.  06/20/18  Yes [provider]  metFORMIN (GLUCOPHAGE-XR) 500 MG 24 hr tablet TAKE 1 TABLET BY MOUTH TWICE DAILY 03/25/19  Yes [name], MD  Multiple Vitamins-Minerals (PRESERVISION AREDS PO) Take 1 capsule by mouth 2 (two) times daily.    Yes [provider]  nitroGLYCERIN (NITROSTAT) 0.4 MG SL tablet 1 tab under the tongue for CP, may repeat in 5 minutes up to 3 doses 09/07/18  Yes [name], MD  rosuvastatin (CRESTOR) 5  MG tablet Take 5 mg by mouth every other day.  03/02/18  Yes [provider]  B-D ULTRAFINE III SHORT PEN 31G X 8 MM MISC USE AS DIRECTED 12/19/18   [name], MD  clindamycin (CLEOCIN) 2 % vaginal cream Place 1 Applicatorful vaginally at bedtime. Patient not taking: Reported on 12/25/2019 10/02/19   [name], MD  traMADol (ULTRAM) 50 MG tablet Take 1 tablet (50 mg total) by mouth every 8 (eight) hours as needed. Patient not taking: Reported on 11/30/2019 11/25/18   Coral Spikes, DO    Family History  Problem Relation Age of  Onset  . Dementia Mother   . Diabetes Father      Social History   Tobacco Use  . Smoking status: Former Smoker    Packs/day: 2.00    Years: 37.00    Pack years: 74.00    Types: Cigarettes    Quit date: 1980    Years since quitting: 41.1  . Smokeless tobacco: Never Used  . Tobacco comment: smoking cessation materials not required  Substance Use Topics  . Alcohol use: No    Alcohol/week: 0.0 standard drinks  . Drug use: No    Allergies as of 12/25/2019 - Review Complete 12/25/2019  Allergen Reaction Noted  . Saxagliptin Diarrhea 07/24/2015  . Epinephrine Other (See Comments) 11/17/2016  . Atorvastatin Other (See Comments) 08/20/2017  . Codeine Other (See Comments) 05/17/2015  . Ezetimibe Other (See Comments) 05/17/2015  . Limonene Rash 07/24/2015  . Nitrofurantoin Rash and Other (See Comments) 05/17/2015  . Sulfa antibiotics Rash and Other (See Comments) 04/11/2015    Review of Systems:    All systems reviewed and negative except where noted in HPI.   Physical Exam:  Vital signs in last 24 hours: Temp:  [97.6 F (36.4 C)-98.4 F (36.9 C)] 97.6 F (36.4 C) (03/01 1152) Pulse Rate:  [73] 73 (03/01 0530) Resp:  [20] 20 (03/01 0530) BP: (146-163)/(44-69) 159/54 (03/01 1152) SpO2:  [99 %-100 %] 99 % (03/01 0530) Weight:  [52.6 kg-57.2 kg] 57.2 kg (03/01 0721) Last BM Date: 12/24/19 General:   Pleasant, cooperative in  NAD Head:  Normocephalic and atraumatic. Eyes:   No icterus.   Conjunctiva pink. PERRLA. Ears:  Normal auditory acuity. Neck:  Supple; no masses or thyroidomegaly Lungs: Respirations even and unlabored. Lungs clear to auscultation bilaterally.   No wheezes, crackles, or rhonchi.  Heart:  Regular rate and rhythm;  Without murmur, clicks, rubs or gallops Abdomen:  Soft, nondistended, nontender. Normal bowel sounds. No appreciable masses or hepatomegaly.  No rebound or guarding.  Rectal:  Not performed. Msk:  Symmetrical without gross deformities.    Extremities:  Without edema, cyanosis or clubbing. Neurologic:  Alert and oriented x3;  grossly normal neurologically. Skin:  Intact without significant lesions or rashes. Cervical Nodes:  No significant cervical adenopathy. Psych:  Alert and cooperative. Normal affect.  LAB RESULTS: Recent Labs    12/25/19 0419 12/25/19 0731  WBC 9.8 10.4  HGB 7.7* 7.1*  HCT 24.2* 22.3*  PLT 261 242   BMET Recent Labs    12/25/19 0419  NA 135  K 4.5  CL 103  CO2 25  GLUCOSE 170*  BUN 38*  CREATININE 1.30*  CALCIUM 8.6*   LFT Recent Labs    12/25/19 0419  PROT 6.0*  ALBUMIN 3.5  AST 18  ALT 14  ALKPHOS 63  BILITOT 0.7   PT/INR Recent Labs    12/25/19 0419  LABPROT 13.8  INR 1.1    STUDIES: DG Chest Port 1 View  Result Date: 12/25/2019 CLINICAL DATA:  Rectal bleeding. EXAM: PORTABLE CHEST 1 VIEW COMPARISON:  Two-view chest x-ray 11/02/2018 FINDINGS: The heart size is normal. Atherosclerotic changes are noted at the aortic arch. No edema or effusion is present. No focal airspace disease is present. Axial skeleton is unremarkable. IMPRESSION: 1. No acute cardiopulmonary disease. 2. Aortic atherosclerosis. Electronically Signed   By: San Morelle M.D.   On: 12/25/2019 04:35      Impression / Plan:   Assessment: Active Problems:   GI bleeding   Jill Lore  Hudson is a 85 y.o. y/o female with a history of diverticulosis  and a diverticular bleed with a colonoscopy and a urine 5 months ago.  The patient now presents with recurrent rectal bleeding that is painless.  Plan:  The patient likely has a diverticular bleed again since it is unlikely that the patient has developed any other pathology within the last 1 year and 5 months to explain her acute GI bleed.  If the patient has any further GI bleeding then the patient should undergo a red tag cell scan to try and delineate where the bleeding source may be.  The source of mobile blood and a colon full of diverticulosis is unlikely to pinpoint the source of bleeding nor is it a good method to control bleeding.  The insufflation of air to do the colonoscopy also works to tamponade the bleeding thereby stopping the bleeding during the colonoscopy.  If the bleeding scan is positive then interventional radiology should be contacted for possible embolization.  Thank you for involving me in the care of this patient.      LOS: 0 days   Lucilla Lame, MD  12/25/2019, 2:46 PM Pager [phone] 7am-5pm  Check AMION for 5pm -7am coverage and on weekends   Note: This dictation was prepared with Dragon dictation along with smaller phrase technology. Any transcriptional errors that result from this process are unintentional.

## 2019-12-26 ENCOUNTER — Inpatient Hospital Stay: Payer: Medicare Other

## 2019-12-26 ENCOUNTER — Encounter: Payer: Self-pay | Admitting: Family Medicine

## 2019-12-26 LAB — TYPE AND SCREEN
ABO/RH(D): A POS
Antibody Screen: NEGATIVE
Unit division: 0
Unit division: 0

## 2019-12-26 LAB — COMPREHENSIVE METABOLIC PANEL
ALT: 11 U/L (ref 0–44)
AST: 11 U/L — ABNORMAL LOW (ref 15–41)
Albumin: 3 g/dL — ABNORMAL LOW (ref 3.5–5.0)
Alkaline Phosphatase: 55 U/L (ref 38–126)
Anion gap: 8 (ref 5–15)
BUN: 30 mg/dL — ABNORMAL HIGH (ref 8–23)
CO2: 18 mmol/L — ABNORMAL LOW (ref 22–32)
Calcium: 8.1 mg/dL — ABNORMAL LOW (ref 8.9–10.3)
Chloride: 115 mmol/L — ABNORMAL HIGH (ref 98–111)
Creatinine, Ser: 1.12 mg/dL — ABNORMAL HIGH (ref 0.44–1.00)
GFR calc Af Amer: 53 mL/min — ABNORMAL LOW (ref >60)
GFR calc non Af Amer: 45 mL/min — ABNORMAL LOW (ref >60)
Glucose, Bld: 222 mg/dL — ABNORMAL HIGH (ref 70–99)
Potassium: 4.1 mmol/L (ref 3.5–5.1)
Sodium: 141 mmol/L (ref 135–145)
Total Bilirubin: 0.8 mg/dL (ref 0.3–1.2)
Total Protein: 5.2 g/dL — ABNORMAL LOW (ref 6.5–8.1)

## 2019-12-26 LAB — CBC
HCT: 28.7 % — ABNORMAL LOW (ref 36.0–46.0)
Hemoglobin: 9.8 g/dL — ABNORMAL LOW (ref 12.0–15.0)
MCH: 30 pg (ref 26.0–34.0)
MCHC: 34.1 g/dL (ref 30.0–36.0)
MCV: 87.8 fL (ref 80.0–100.0)
Platelets: 200 10*3/uL (ref 150–400)
RBC: 3.27 MIL/uL — ABNORMAL LOW (ref 3.87–5.11)
RDW: 14.3 % (ref 11.5–15.5)
WBC: 7.8 10*3/uL (ref 4.0–10.5)
nRBC: 0 % (ref 0.0–0.2)

## 2019-12-26 LAB — BPAM RBC
Blood Product Expiration Date: 202103242359
Blood Product Expiration Date: 202103292359
ISSUE DATE / TIME: 202103010816
ISSUE DATE / TIME: 202103011134
Unit Type and Rh: 6200
Unit Type and Rh: 6200

## 2019-12-26 LAB — GLUCOSE, CAPILLARY
Glucose-Capillary: 212 mg/dL — ABNORMAL HIGH (ref 70–99)
Glucose-Capillary: 261 mg/dL — ABNORMAL HIGH (ref 70–99)
Glucose-Capillary: 203 mg/dL — ABNORMAL HIGH (ref 70–99)

## 2019-12-26 LAB — PREPARE RBC (CROSSMATCH)

## 2019-12-26 LAB — HEMOGLOBIN AND HEMATOCRIT, BLOOD
HCT: 29.6 % — ABNORMAL LOW (ref 36.0–46.0)
HCT: 24.1 % — ABNORMAL LOW (ref 36.0–46.0)
Hemoglobin: 9.7 g/dL — ABNORMAL LOW (ref 12.0–15.0)
Hemoglobin: 8 g/dL — ABNORMAL LOW (ref 12.0–15.0)

## 2019-12-26 MED ORDER — INSULIN ASPART 100 UNIT/ML ~~LOC~~ SOLN
0.0000 [IU] | Freq: Three times a day (TID) | SUBCUTANEOUS | Status: DC
  Administered 2019-12-26: 3 [IU] via SUBCUTANEOUS
  Administered 2019-12-26 – 2019-12-27 (×2): 2 [IU] via SUBCUTANEOUS
  Administered 2019-12-27: 1 [IU] via SUBCUTANEOUS
  Administered 2019-12-27 (×2): 2 [IU] via SUBCUTANEOUS
  Administered 2019-12-28: 3 [IU] via SUBCUTANEOUS
  Filled 2019-12-26 (×7): qty 1

## 2019-12-26 MED ORDER — INSULIN ASPART 100 UNIT/ML ~~LOC~~ SOLN: 0.0000 [IU] | Freq: Four times a day (QID) | SUBCUTANEOUS | Status: DC

## 2019-12-26 MED ORDER — TECHNETIUM TC 99M-LABELED RED BLOOD CELLS IV KIT
25.0000 | PACK | Freq: Once | INTRAVENOUS | Status: AC | PRN
  Administered 2019-12-26: 20.359 via INTRAVENOUS

## 2019-12-26 MED ORDER — HYDRALAZINE HCL 25 MG PO TABS
25.0000 mg | ORAL_TABLET | Freq: Four times a day (QID) | ORAL | Status: DC | PRN
  Administered 2019-12-26: 25 mg via ORAL
  Filled 2019-12-26: qty 1

## 2019-12-26 MED ORDER — SODIUM CHLORIDE 0.9 % IV SOLN
8.0000 mg/h | INTRAVENOUS | Status: DC
  Administered 2019-12-26 – 2019-12-27 (×2): 8 mg/h via INTRAVENOUS
  Filled 2019-12-26 (×3): qty 80

## 2019-12-26 NOTE — Progress Notes (Signed)
Jill Lame, MD [facility]   [address] [place], [place] [address] Phone: [phone] Fax : [phone]   Subjective: This patient was reported to have another large bloody bowel movement and a bleeding scan was ordered.  The patient denies any abdominal pain or lightheadedness.  The patient's hemoglobin yesterday was 10.2 and today was 9.8.   Objective: Vital signs in last 24 hours: Vitals:   12/26/19 1230 12/26/19 1246 12/26/19 1300 12/26/19 1311  BP: (!) 166/50 (!) 167/55 (!) 167/55 (!) 149/55  Pulse: 93 94 94 95  Resp:      Temp:      TempSrc:      SpO2:      Weight:      Height:       Weight change: 4.583 kg  Intake/Output Summary (Last 24 hours) at 12/26/2019 1350 Last data filed at 12/26/2019 3976 Gross per 24 hour  Intake 968.72 ml  Output 600 ml  Net 368.72 ml     Exam: Heart:: Regular rate and rhythm, S1S2 present or without murmur or extra heart sounds Lungs: normal and clear to auscultation and percussion Abdomen: soft, nontender, normal bowel sounds   Lab Results: @LABTEST2 @ Micro Results: Recent Results (from the past 240 hour(s))  Respiratory Panel by RT PCR (Flu A&B, Covid) - Nasopharyngeal Swab     Status: None   Collection Time: 12/25/19  4:19 AM   Specimen: Nasopharyngeal Swab  Result Value Ref Range Status   SARS Coronavirus 2 by RT PCR NEGATIVE NEGATIVE Final    Comment: (NOTE) SARS-CoV-2 target nucleic acids are NOT DETECTED. The SARS-CoV-2 RNA is generally detectable in upper respiratoy specimens during the acute phase of infection. The lowest concentration of SARS-CoV-2 viral copies this assay can detect is 131 copies/mL. A negative result does not preclude SARS-Cov-2 infection and should not be used as the sole basis for treatment or other patient management decisions. A negative result may occur with  improper specimen collection/handling, submission of specimen other than nasopharyngeal swab, presence of viral mutation(s)  within the areas targeted by this assay, and inadequate number of viral copies (<131 copies/mL). A negative result must be combined with clinical observations, patient history, and epidemiological information. The expected result is Negative. Fact Sheet for Patients:  PinkCheek.be Fact Sheet for Healthcare Providers:  GravelBags.it This test is not yet ap proved or cleared by the [place] FDA and  has been authorized for detection and/or diagnosis of SARS-CoV-2 by FDA under an Emergency Use Authorization (EUA). This EUA will remain  in effect (meaning this test can be used) for the duration of the COVID-19 declaration under Section 564(b)(1) of the Act, 21 U.S.C. section 360bbb-3(b)(1), unless the authorization is terminated or revoked sooner.    Influenza A by PCR NEGATIVE NEGATIVE Final   Influenza B by PCR NEGATIVE NEGATIVE Final    Comment: (NOTE) The Xpert Xpress SARS-CoV-2/FLU/RSV assay is intended as an aid in  the diagnosis of influenza from Nasopharyngeal swab specimens and  should not be used as a sole basis for treatment. Nasal washings and  aspirates are unacceptable for Xpert Xpress SARS-CoV-2/FLU/RSV  testing. Fact Sheet for Patients: PinkCheek.be Fact Sheet for Healthcare Providers: GravelBags.it This test is not yet approved or cleared by the [place] FDA and  has been authorized for detection and/or diagnosis of SARS-CoV-2 by  FDA under an Emergency Use Authorization (EUA). This EUA will remain  in effect (meaning this test can be used) for the duration of the  Covid-19 declaration under Section 564(b)(1) of the Act, 21  U.S.C. section 360bbb-3(b)(1), unless the authorization is  terminated or revoked. Performed at [facility], [address] [place], [place] [address]   MRSA PCR Screening     Status: None   Collection Time:  12/25/19  7:30 AM   Specimen: Nasopharyngeal  Result Value Ref Range Status   MRSA by PCR NEGATIVE NEGATIVE Final    Comment:        The GeneXpert MRSA Assay (FDA approved for NASAL specimens only), is one component of a comprehensive MRSA colonization surveillance program. It is not intended to diagnose MRSA infection nor to guide or monitor treatment for MRSA infections. Performed at [facility], [address], [place], [place] [address]    Studies/Results: [facility] Chest Port 1 View  Result Date: 12/25/2019 CLINICAL DATA:  Rectal bleeding. EXAM: PORTABLE CHEST 1 VIEW COMPARISON:  Two-view chest x-ray 11/02/2018 FINDINGS: The heart size is normal. Atherosclerotic changes are noted at the aortic arch. No edema or effusion is present. No focal airspace disease is present. Axial skeleton is unremarkable. IMPRESSION: 1. No acute cardiopulmonary disease. 2. Aortic atherosclerosis. Electronically Signed   By: San Morelle M.D.   On: 12/25/2019 04:35   Medications: I have reviewed the patient's current medications. Scheduled Meds: . baclofen  10 mg Oral QHS  . Chlorhexidine Gluconate Cloth  6 each Topical Daily  . diltiazem  180 mg Oral BID  . ferrous sulfate  325 mg Oral Q breakfast  . gabapentin  100 mg Oral QHS  . insulin aspart  0-6 Units Subcutaneous Q6H  . insulin glargine  10 Units Subcutaneous Daily  . multivitamin-lutein   Oral BID  . [START ON 12/28/2019] pantoprazole  40 mg Intravenous Q12H  . rosuvastatin  5 mg Oral QODAY  . vitamin B-12  1,000 mcg Oral Daily   Continuous Infusions: . sodium chloride    . sodium chloride    . sodium chloride    . pantoprozole (PROTONIX) infusion 8 mg/hr (12/26/19 1035)   PRN Meds:.acetaminophen **OR** acetaminophen, nitroGLYCERIN, ondansetron **OR** ondansetron (ZOFRAN) IV, traZODone   Assessment: Active Problems:   GI bleeding   Rectal bleeding    Plan: This patient was brought down for a bleeding scan due to  the new onset of bleeding.  The patient has a known history of diverticulosis and diverticular bleeding.  The patient had a colonoscopy 1 year and 5 months ago for the same symptoms.  If the bleeding scan is positive the patient should have interventional radiology consider angiography.  There is no need for any further GI intervention at this time.   LOS: 1 day   Jill Hudson 12/26/2019, 1:50 PM Pager [phone] 7am-5pm  Check AMION for 5pm -7am coverage and on weekends

## 2019-12-26 NOTE — Progress Notes (Signed)
PROGRESS NOTE    Jill Hudson  KDX:[id number] DOB: [birth year] DOA: 12/25/2019 PCP: [name], MD     Assessment & Plan:   Active Problems:   GI bleeding   Rectal bleeding  GI bleeding: possibly diverticular bleed but ddx: bleeding masses, AVMs, gastritis, duodenitis, gastric or duodenal ulcers/erosions or AVMs. Continue on IV protonix. S/p 2 units of PRBCs transfused so far. Will continue to monitor H&H. GI consulted & recs tagged RBC scan if pt continues to bleed. So far pt has had 5 bloody BMs on 12/25/19, tagged RBCs scan was neg but pt still w/ bloody BMs today. Continue to hold aspirin & plavix  Acute blood loss anemia: secondary to above. Continue to monitor H&H closely. S/p 2 units of PRBCs transfused on 12/25/19.  AKI: continue on IVFs. Baseline Cr/GFR is unknown.  Cr is trending down today. Continue to hold home dose of cozaar and chlorthalidone.  Hypertension: continue to hold amlodipine, diltiazem, chlorthalidone, cozaar  DM2: continue on SSI w/ accuchecks. Continue to hold home dose of metformin   Dyslipidemia: continue on statin  CAD: stable. Continue on statin & nitro prn   Peripheral neuropathy: continue on home dose of neurontin.  Metabolic acidosis: will start bicarb  DVT prophylaxis: SCDs Code Status: full  Family Communication:  Disposition Plan: will likely need several days more as pt is still having bloody BM    Consultants:   GI   Procedures:    Antimicrobials:    Subjective: Pt c/o bloody BM  Objective: Vitals:   12/26/19 0300 12/26/19 0400 12/26/19 0500 12/26/19 0600  BP: (!) 156/57 (!) 154/50 (!) 146/51 (!) 145/58  Pulse: 90 88 87 89  Resp: 17 18 18 18   Temp:      TempSrc:      SpO2: 96% 97% 95% 96%  Weight:      Height:        Intake/Output Summary (Last 24 hours) at 12/26/2019 0817 Last data filed at 12/26/2019 0400 Gross per 24 hour  Intake 1216.85 ml  Output 50 ml  Net 1166.85 ml   Filed Weights   12/25/19 0412  12/25/19 0721  Weight: 52.6 kg 57.2 kg    Examination:  General exam: Appears calm and comfortable  Respiratory system: Clear to auscultation. No wheezes, rales or rhonchi Cardiovascular system: S1 & S2 +. No rubs, gallops or clicks.  Gastrointestinal system: Abdomen is nondistended, soft and tenderness to palpation. hypoactive bowel sounds heard. Central nervous system: Alert and oriented.Moves all 4 extremities Psychiatry: Judgement and insight appear normal. Flat mood and affect    Data Reviewed: I have personally reviewed following labs and imaging studies  CBC: Recent Labs  Lab 12/25/19 0419 12/25/19 0731 12/25/19 1559 12/25/19 2145 12/26/19 0412  WBC 9.8 10.4  --   --  7.8  NEUTROABS 7.2 7.9*  --   --   --   HGB 7.7* 7.1* 10.2* 10.2* 9.8*  HCT 24.2* 22.3* 29.9* 30.1* 28.7*  MCV 91.7 91.4  --   --  87.8  PLT 261 242  --   --  976   Basic Metabolic Panel: Recent Labs  Lab 12/25/19 0419 12/26/19 0412  NA 135 141  K 4.5 4.1  CL 103 115*  CO2 25 18*  GLUCOSE 170* 222*  BUN 38* 30*  CREATININE 1.30* 1.12*  CALCIUM 8.6* 8.1*   GFR: Estimated Creatinine Clearance: 28.7 mL/min (A) (by C-G formula based on SCr of 1.12 mg/dL (H)). Liver Function Tests:  Recent Labs  Lab 12/25/19 0419 12/26/19 0412  AST 18 11*  ALT 14 11  ALKPHOS 63 55  BILITOT 0.7 0.8  PROT 6.0* 5.2*  ALBUMIN 3.5 3.0*   No results for input(s): LIPASE, AMYLASE in the last 168 hours. No results for input(s): AMMONIA in the last 168 hours. Coagulation Profile: Recent Labs  Lab 12/25/19 0419  INR 1.1   Cardiac Enzymes: No results for input(s): CKTOTAL, CKMB, CKMBINDEX, TROPONINI in the last 168 hours. BNP (last 3 results) No results for input(s): PROBNP in the last 8760 hours. HbA1C: No results for input(s): HGBA1C in the last 72 hours. CBG: Recent Labs  Lab 12/25/19 0722  GLUCAP 180*   Lipid Profile: No results for input(s): CHOL, HDL, LDLCALC, TRIG, CHOLHDL, LDLDIRECT in the  last 72 hours. Thyroid Function Tests: Recent Labs    12/25/19 0731  TSH 3.512   Anemia Panel: No results for input(s): VITAMINB12, FOLATE, FERRITIN, TIBC, IRON, RETICCTPCT in the last 72 hours. Sepsis Labs: No results for input(s): PROCALCITON, LATICACIDVEN in the last 168 hours.  Recent Results (from the past 240 hour(s))  Respiratory Panel by RT PCR (Flu A&B, Covid) - Nasopharyngeal Swab     Status: None   Collection Time: 12/25/19  4:19 AM   Specimen: Nasopharyngeal Swab  Result Value Ref Range Status   SARS Coronavirus 2 by RT PCR NEGATIVE NEGATIVE Final    Comment: (NOTE) SARS-CoV-2 target nucleic acids are NOT DETECTED. The SARS-CoV-2 RNA is generally detectable in upper respiratoy specimens during the acute phase of infection. The lowest concentration of SARS-CoV-2 viral copies this assay can detect is 131 copies/mL. A negative result does not preclude SARS-Cov-2 infection and should not be used as the sole basis for treatment or other patient management decisions. A negative result may occur with  improper specimen collection/handling, submission of specimen other than nasopharyngeal swab, presence of viral mutation(s) within the areas targeted by this assay, and inadequate number of viral copies (<131 copies/mL). A negative result must be combined with clinical observations, patient history, and epidemiological information. The expected result is Negative. Fact Sheet for Patients:  PinkCheek.be Fact Sheet for Healthcare Providers:  GravelBags.it This test is not yet ap proved or cleared by the [place] FDA and  has been authorized for detection and/or diagnosis of SARS-CoV-2 by FDA under an Emergency Use Authorization (EUA). This EUA will remain  in effect (meaning this test can be used) for the duration of the COVID-19 declaration under Section 564(b)(1) of the Act, 21 U.S.C. section 360bbb-3(b)(1),  unless the authorization is terminated or revoked sooner.    Influenza A by PCR NEGATIVE NEGATIVE Final   Influenza B by PCR NEGATIVE NEGATIVE Final    Comment: (NOTE) The Xpert Xpress SARS-CoV-2/FLU/RSV assay is intended as an aid in  the diagnosis of influenza from Nasopharyngeal swab specimens and  should not be used as a sole basis for treatment. Nasal washings and  aspirates are unacceptable for Xpert Xpress SARS-CoV-2/FLU/RSV  testing. Fact Sheet for Patients: PinkCheek.be Fact Sheet for Healthcare Providers: GravelBags.it This test is not yet approved or cleared by the [place] FDA and  has been authorized for detection and/or diagnosis of SARS-CoV-2 by  FDA under an Emergency Use Authorization (EUA). This EUA will remain  in effect (meaning this test can be used) for the duration of the  Covid-19 declaration under Section 564(b)(1) of the Act, 21  U.S.C. section 360bbb-3(b)(1), unless the authorization is  terminated or revoked. Performed at  [facility], [address] [place], [place] [address]   MRSA PCR Screening     Status: None   Collection Time: 12/25/19  7:30 AM   Specimen: Nasopharyngeal  Result Value Ref Range Status   MRSA by PCR NEGATIVE NEGATIVE Final    Comment:        The GeneXpert MRSA Assay (FDA approved for NASAL specimens only), is one component of a comprehensive MRSA colonization surveillance program. It is not intended to diagnose MRSA infection nor to guide or monitor treatment for MRSA infections. Performed at [facility], [address], [place], [place] [address]          Radiology Studies: [facility] Chest [place] 1 View  Result Date: 12/25/2019 CLINICAL DATA:  Rectal bleeding. EXAM: PORTABLE CHEST 1 VIEW COMPARISON:  Two-view chest x-ray 11/02/2018 FINDINGS: The heart size is normal. Atherosclerotic changes are noted at the aortic arch. No edema or effusion is  present. No focal airspace disease is present. Axial skeleton is unremarkable. IMPRESSION: 1. No acute cardiopulmonary disease. 2. Aortic atherosclerosis. Electronically Signed   By: San Morelle M.D.   On: 12/25/2019 04:35        Scheduled Meds: . baclofen  10 mg Oral QHS  . Chlorhexidine Gluconate Cloth  6 each Topical Daily  . diltiazem  180 mg Oral BID  . ferrous sulfate  325 mg Oral Q breakfast  . gabapentin  100 mg Oral QHS  . insulin aspart  0-6 Units Subcutaneous Q6H  . insulin glargine  10 Units Subcutaneous Daily  . multivitamin-lutein   Oral BID  . [START ON 12/28/2019] pantoprazole  40 mg Intravenous Q12H  . rosuvastatin  5 mg Oral QODAY  . vitamin B-12  1,000 mcg Oral Daily   Continuous Infusions: . sodium chloride    . sodium chloride    . sodium chloride    . pantoprozole (PROTONIX) infusion 8 mg/hr (12/26/19 0548)     LOS: 1 day    Time spent: 31 mins    Wyvonnia Dusky, MD [facility] Pager [phone]  If 7PM-7AM, please contact night-coverage [email or web address] 12/26/2019, 8:17 AM

## 2019-12-26 NOTE — Progress Notes (Signed)
Report called to floor RN. Pt in no acute distress at this time. PRN antihypertensive ordered and given. Tolerating diet order. Adequate urine output. Bleeding scan complete.

## 2019-12-26 NOTE — Progress Notes (Signed)
Initial Nutrition Assessment  DOCUMENTATION CODES:   Not applicable  INTERVENTION:  Once diet advances provide Glucerna Shake po TID, each supplement provides 220 kcal and 10 grams of protein. Patient prefers chocolate.  NUTRITION DIAGNOSIS:   Inadequate oral intake related to decreased appetite as evidenced by (per Malnutrition Screening Tool report).  GOAL:   Patient will meet greater than or equal to 90% of their needs  MONITOR:   PO intake, Diet advancement, Supplement acceptance, Labs, Weight trends, I & O's  REASON FOR ASSESSMENT:   Malnutrition Screening Tool    ASSESSMENT:   85 year old female with PMHx of DM, HTN, anxiety, PVD, HLD, hx TIA, hx MI, arthritis admitted with GI bleeding, AKI.   Attempted to see patient at bedside this morning but she was out of room for procedure. She is known to our service from previous admission. She enjoys chocolate Glucerna and drinks them at home. Will monitor for diet advancement and adequacy of intake.  Patient was 53.1 kg on 11/25/2018. Her weight dropped to 47.2 kg on 01/23/2019. She has since gained weight and was 53.1 kg on 11/30/2019 but is now listed as being 47.2 kg (126.1 lbs).  Medications reviewed and include: ferrous sulfate 325 mg daily, gabapentin, Novolog 0-6 units Q6hrs, Lantus 10 units daily, Ocuvite BID, pantoprazole, vitamin B12 1000 micrograms daily.  Labs reviewed: Chloride 115, BUN 30, Creatinine 1.12.  Unable to determine if patient meets criteria for malnutrition at this time.  NUTRITION - FOCUSED PHYSICAL EXAM:  Unable to complete as patient out of room.  Diet Order:   Diet Order            Diet NPO time specified  Diet effective now             EDUCATION NEEDS:   No education needs have been identified at this time  Skin:  Skin Assessment: Reviewed RN Assessment(ecchymosis)  Last BM:  12/26/2019 small type 7  Height:   Ht Readings from Last 1 Encounters:  12/25/19 5\' 1"  (1.549 m)    Weight:   Wt Readings from Last 1 Encounters:  12/25/19 57.2 kg   Ideal Body Weight:  47.7 kg  BMI:  Body mass index is 23.83 kg/m.  Estimated Nutritional Needs:   Kcal:  1450-1650  Protein:  70-80 grams  Fluid:  1.5 L/day  Jacklynn Barnacle, MS, RD, LDN Pager number available on Amion

## 2019-12-27 DIAGNOSIS — N189 Chronic kidney disease, unspecified: Secondary | ICD-10-CM

## 2019-12-27 DIAGNOSIS — I739 Peripheral vascular disease, unspecified: Secondary | ICD-10-CM

## 2019-12-27 DIAGNOSIS — I10 Essential (primary) hypertension: Secondary | ICD-10-CM

## 2019-12-27 DIAGNOSIS — N183 Chronic kidney disease, stage 3 unspecified: Secondary | ICD-10-CM

## 2019-12-27 DIAGNOSIS — E1122 Type 2 diabetes mellitus with diabetic chronic kidney disease: Secondary | ICD-10-CM

## 2019-12-27 DIAGNOSIS — K5731 Diverticulosis of large intestine without perforation or abscess with bleeding: Secondary | ICD-10-CM

## 2019-12-27 DIAGNOSIS — D62 Acute posthemorrhagic anemia: Secondary | ICD-10-CM

## 2019-12-27 DIAGNOSIS — N179 Acute kidney failure, unspecified: Secondary | ICD-10-CM

## 2019-12-27 DIAGNOSIS — E785 Hyperlipidemia, unspecified: Secondary | ICD-10-CM

## 2019-12-27 LAB — GLUCOSE, CAPILLARY
Glucose-Capillary: 165 mg/dL — ABNORMAL HIGH (ref 70–99)
Glucose-Capillary: 245 mg/dL — ABNORMAL HIGH (ref 70–99)
Glucose-Capillary: 202 mg/dL — ABNORMAL HIGH (ref 70–99)
Glucose-Capillary: 223 mg/dL — ABNORMAL HIGH (ref 70–99)

## 2019-12-27 LAB — CBC
HCT: 25.1 % — ABNORMAL LOW (ref 36.0–46.0)
Hemoglobin: 8.3 g/dL — ABNORMAL LOW (ref 12.0–15.0)
MCH: 30 pg (ref 26.0–34.0)
MCHC: 33.1 g/dL (ref 30.0–36.0)
MCV: 90.6 fL (ref 80.0–100.0)
Platelets: 169 10*3/uL (ref 150–400)
RBC: 2.77 MIL/uL — ABNORMAL LOW (ref 3.87–5.11)
RDW: 14 % (ref 11.5–15.5)
WBC: 8.2 10*3/uL (ref 4.0–10.5)
nRBC: 0 % (ref 0.0–0.2)

## 2019-12-27 LAB — BASIC METABOLIC PANEL
Anion gap: 9 (ref 5–15)
BUN: 31 mg/dL — ABNORMAL HIGH (ref 8–23)
CO2: 19 mmol/L — ABNORMAL LOW (ref 22–32)
Calcium: 8 mg/dL — ABNORMAL LOW (ref 8.9–10.3)
Chloride: 112 mmol/L — ABNORMAL HIGH (ref 98–111)
Creatinine, Ser: 1.09 mg/dL — ABNORMAL HIGH (ref 0.44–1.00)
GFR calc Af Amer: 54 mL/min — ABNORMAL LOW (ref >60)
GFR calc non Af Amer: 47 mL/min — ABNORMAL LOW (ref >60)
Glucose, Bld: 198 mg/dL — ABNORMAL HIGH (ref 70–99)
Potassium: 3.7 mmol/L (ref 3.5–5.1)
Sodium: 140 mmol/L (ref 135–145)

## 2019-12-27 MED ORDER — PANTOPRAZOLE SODIUM 40 MG PO TBEC
40.0000 mg | DELAYED_RELEASE_TABLET | Freq: Every day | ORAL | Status: DC
  Administered 2019-12-27 – 2019-12-28 (×2): 40 mg via ORAL
  Filled 2019-12-27 (×2): qty 1

## 2019-12-27 MED ORDER — GLUCERNA SHAKE PO LIQD
237.0000 mL | Freq: Three times a day (TID) | ORAL | Status: DC
  Administered 2019-12-27 – 2019-12-28 (×4): 237 mL via ORAL

## 2019-12-27 MED ORDER — SODIUM CHLORIDE 0.9 % IV SOLN
400.0000 mg | Freq: Once | INTRAVENOUS | Status: AC
  Administered 2019-12-27: 400 mg via INTRAVENOUS
  Filled 2019-12-27: qty 20

## 2019-12-27 NOTE — TOC Initial Note (Signed)
Transition of Care [facility]) - Initial/Assessment Note    Patient Details  Name: Jill Hudson MRN: [id number] Date of Birth: 1935-06-25  [facility]) CM/SW Contact:    Beverly Sessions, RN Phone Number: 12/27/2019, 3:44 PM  Clinical Narrative:                 Patient admitted from home with GI bleeding.  Patient states that she lives at home with her husband and daughter.   Patient states that she has a cane, rollator, and standard walker in the home  PCP [facility] - denies issues with transportation Pharmacy [facility] - denies issues obtaining medications, but states "insurance doesn't pay for them as good as they used to"  PT has assessed patient and recommends home health PT.  Patient agreeable.  States that she had [place] in 2019 and would like to use them again.  Referral made to [facility] with [place]    Expected Discharge Plan: [place] Barriers to Discharge: Continued Medical Work up   Patient Goals and CMS Choice     Choice offered to / list presented to : Patient  Expected Discharge Plan and Services Expected Discharge Plan: [place]   Discharge Planning Services: CM Consult Post Acute Care Choice: [place] arrangements for the past 2 months: [place]: PT [place]: [place] ([place]) Date HH Agency Contacted: 12/27/19   Representative spoke with at [place]: Corene Cornea  Prior Living Arrangements/Services Living arrangements for the past 2 months: [place] Lives with:: Spouse, Adult Children Patient language and need for interpreter reviewed:: Yes Do you feel safe going back to the place where you live?: Yes      Need for Family Participation in Patient Care: Yes (Comment) Care giver support system in place?: Yes (comment) Current home services: DME Criminal Activity/Legal Involvement Pertinent to Current  Situation/Hospitalization: No - Comment as needed  Activities of Daily Living Home Assistive Devices/Equipment: Cane (specify quad or straight), Walker (specify type) ADL Screening (condition at time of admission) Patient's cognitive ability adequate to safely complete daily activities?: Yes Is the patient deaf or have difficulty hearing?: No Does the patient have difficulty seeing, even when wearing glasses/contacts?: No Does the patient have difficulty concentrating, remembering, or making decisions?: No Patient able to express need for assistance with ADLs?: Yes Does the patient have difficulty dressing or bathing?: No Independently performs ADLs?: Yes (appropriate for developmental age) Does the patient have difficulty walking or climbing stairs?: No Weakness of Legs: None Weakness of Arms/Hands: None  Permission Sought/Granted                  Emotional Assessment Appearance:: Appears stated age Attitude/Demeanor/Rapport: Gracious Affect (typically observed): Accepting Orientation: : Oriented to Self, Oriented to  Time, Oriented to Place, Oriented to Situation   Psych Involvement: No (comment)  Admission diagnosis:  GI bleeding [K92.2] Rectal bleeding [K62.5] GI bleed [K92.2] Patient Active Problem List   Diagnosis Date Noted  . Acute blood loss anemia   . Diverticulosis of large intestine with hemorrhage   . Acute kidney injury superimposed on CKD ([place])   . GI bleeding 12/25/2019  . Rectal bleeding   . Age-related macular degeneration, dry, left eye 06/21/2019  . Age-related macular  degeneration, wet, right eye ([place]) 06/21/2019  . Moderate nonproliferative diabetic retinopathy associated with type 2 diabetes mellitus ([place]) 06/21/2019  . Lumbosacral radiculopathy at L4 05/01/2019  . Underweight 05/01/2019  . Encounter for long-term (current) use of aspirin 10/31/2018  . Encounter for long-term (current) use of antiplatelets/antithrombotics 10/31/2018  . Long term  current use of oral hypoglycemic drug 10/31/2018  . Encounter for long-term (current) use of insulin ([place]) 10/31/2018  . GIB (gastrointestinal bleeding) 08/11/2018  . Leg pain 07/03/2017  . Carpal tunnel syndrome on both sides 05/05/2017  . Myalgia due to HMG CoA reductase inhibitor 05/05/2017  . History of CVA (cerebrovascular accident) 02/15/2017  . Degenerative disc disease, lumbar 12/21/2016  . Atherosclerosis of native arteries of extremity with intermittent claudication ([place]) 12/20/2016  . Bilateral carotid artery stenosis 12/20/2016  . Hip bursitis 05/18/2016  . Elevated TSH 01/18/2016  . CKD stage 3 due to type 2 diabetes mellitus ([place]) 01/16/2016  . Senile ecchymosis 01/16/2016  . Type II diabetes mellitus with complication ([place]) 99/37/1696  . GERD (gastroesophageal reflux disease) 07/24/2015  . PVD (peripheral vascular disease) ([place]) 05/17/2015  . Neoplasm of uncertain behavior of skin 05/17/2015  . Retinopathy, diabetic, proliferative ([place]) 04/11/2015  . Hyperlipidemia 04/11/2015  . Essential hypertension 04/11/2015  . Generalized OA 04/11/2015  . Arteriosclerosis of coronary artery 05/29/2013  . Hypertensive heart disease without CHF 05/29/2013   PCP:  [name], MD Pharmacy:   [facility] [place], [address] [facility] [address] AT [place] [facility] [place] [address] Phone: [phone] Fax: [place], [place] [address] [place] [address] Phone: [phone] Fax: [phone]     Social Determinants of Health (SDOH) Interventions    Readmission Risk Interventions Readmission Risk Prevention Plan 10/15/2018  Transportation Screening Complete  PCP or Specialist Appt within 5-7 Days Complete  Home Care Screening Complete  Medication Review (RN CM) Complete  Some recent data might be hidden

## 2019-12-27 NOTE — Progress Notes (Signed)
Patient ID: Jill Hudson, female   DOB: [birth year], 85 y.o.   MRN: [id number] [facility] PROGRESS NOTE  EPIFANIA LITTRELL HOZ:[id number] DOB: 1934-12-02 DOA: 12/25/2019 PCP: [name], MD  HPI/Subjective: Patient seen this morning.  She had not had a bowel movement since yesterday.  Yesterday she did have blood in the bowel movement bright red.  Bleeding scan negative.  Not having any abdominal pain.  Tolerating diet.  Objective: Vitals:   12/27/19 0716 12/27/19 0813  BP: (!) 133/51 117/77  Pulse: 62   Resp: 18   Temp: 98.1 F (36.7 C)   SpO2: 95%     Intake/Output Summary (Last 24 hours) at 12/27/2019 1349 Last data filed at 12/27/2019 0600 Gross per 24 hour  Intake 1474.11 ml  Output 400 ml  Net 1074.11 ml   Filed Weights   12/25/19 0412 12/25/19 0721  Weight: 52.6 kg 57.2 kg    ROS: Review of Systems  Constitutional: Negative for chills and fever.  Eyes: Negative for blurred vision.  Respiratory: Negative for cough and shortness of breath.   Cardiovascular: Negative for chest pain.  Gastrointestinal: Positive for blood in stool. Negative for abdominal pain, constipation, diarrhea, nausea and vomiting.  Genitourinary: Negative for dysuria.  Musculoskeletal: Negative for joint pain.  Neurological: Negative for dizziness and headaches.   Exam: Physical Exam  Constitutional: She is oriented to person, place, and time.  HENT:  Nose: No mucosal edema.  Mouth/Throat: No oropharyngeal exudate or posterior oropharyngeal edema.  Eyes: Conjunctivae and lids are normal.  Neck: Carotid bruit is not present.  Cardiovascular: S1 normal and S2 normal. Exam reveals no gallop.  No murmur heard. Respiratory: No respiratory distress. She has no wheezes. She has no rhonchi. She has no rales.  GI: Soft. Bowel sounds are normal. There is no abdominal tenderness.  Musculoskeletal:     Right ankle: No swelling.     Left ankle: No swelling.  Neurological: She is alert and oriented  to person, place, and time.  Skin: Skin is warm. No rash noted. Nails show no clubbing.  Psychiatric: She has a normal mood and affect.      Data Reviewed: Basic Metabolic Panel: Recent Labs  Lab 12/25/19 0419 12/26/19 0412 12/27/19 0608  NA 135 141 140  K 4.5 4.1 3.7  CL 103 115* 112*  CO2 25 18* 19*  GLUCOSE 170* 222* 198*  BUN 38* 30* 31*  CREATININE 1.30* 1.12* 1.09*  CALCIUM 8.6* 8.1* 8.0*   Liver Function Tests: Recent Labs  Lab 12/25/19 0419 12/26/19 0412  AST 18 11*  ALT 14 11  ALKPHOS 63 55  BILITOT 0.7 0.8  PROT 6.0* 5.2*  ALBUMIN 3.5 3.0*   CBC: Recent Labs  Lab 12/25/19 0419 12/25/19 0419 12/25/19 0731 12/25/19 1559 12/25/19 2145 12/26/19 0412 12/26/19 1431 12/26/19 2154 12/27/19 0608  WBC 9.8  --  10.4  --   --  7.8  --   --  8.2  NEUTROABS 7.2  --  7.9*  --   --   --   --   --   --   HGB 7.7*   < > 7.1*   < > 10.2* 9.8* 9.7* 8.0* 8.3*  HCT 24.2*   < > 22.3*   < > 30.1* 28.7* 29.6* 24.1* 25.1*  MCV 91.7  --  91.4  --   --  87.8  --   --  90.6  PLT 261  --  242  --   --  200  --   --  169   < > = values in this interval not displayed.    CBG: Recent Labs  Lab 12/26/19 1405 12/26/19 1638 12/26/19 2045 12/27/19 0752 12/27/19 1138  GLUCAP 212* 261* 203* 165* 245*    Recent Results (from the past 240 hour(s))  Respiratory Panel by RT PCR (Flu A&B, Covid) - Nasopharyngeal Swab     Status: None   Collection Time: 12/25/19  4:19 AM   Specimen: Nasopharyngeal Swab  Result Value Ref Range Status   SARS Coronavirus 2 by RT PCR NEGATIVE NEGATIVE Final    Comment: (NOTE) SARS-CoV-2 target nucleic acids are NOT DETECTED. The SARS-CoV-2 RNA is generally detectable in upper respiratoy specimens during the acute phase of infection. The lowest concentration of SARS-CoV-2 viral copies this assay can detect is 131 copies/mL. A negative result does not preclude SARS-Cov-2 infection and should not be used as the sole basis for treatment or other  patient management decisions. A negative result may occur with  improper specimen collection/handling, submission of specimen other than nasopharyngeal swab, presence of viral mutation(s) within the areas targeted by this assay, and inadequate number of viral copies (<131 copies/mL). A negative result must be combined with clinical observations, patient history, and epidemiological information. The expected result is Negative. Fact Sheet for Patients:  PinkCheek.be Fact Sheet for Healthcare Providers:  GravelBags.it This test is not yet ap proved or cleared by the [place] FDA and  has been authorized for detection and/or diagnosis of SARS-CoV-2 by FDA under an Emergency Use Authorization (EUA). This EUA will remain  in effect (meaning this test can be used) for the duration of the COVID-19 declaration under Section 564(b)(1) of the Act, 21 U.S.C. section 360bbb-3(b)(1), unless the authorization is terminated or revoked sooner.    Influenza A by PCR NEGATIVE NEGATIVE Final   Influenza B by PCR NEGATIVE NEGATIVE Final    Comment: (NOTE) The Xpert Xpress SARS-CoV-2/FLU/RSV assay is intended as an aid in  the diagnosis of influenza from Nasopharyngeal swab specimens and  should not be used as a sole basis for treatment. Nasal washings and  aspirates are unacceptable for Xpert Xpress SARS-CoV-2/FLU/RSV  testing. Fact Sheet for Patients: PinkCheek.be Fact Sheet for Healthcare Providers: GravelBags.it This test is not yet approved or cleared by the [place] FDA and  has been authorized for detection and/or diagnosis of SARS-CoV-2 by  FDA under an Emergency Use Authorization (EUA). This EUA will remain  in effect (meaning this test can be used) for the duration of the  Covid-19 declaration under Section 564(b)(1) of the Act, 21  U.S.C. section 360bbb-3(b)(1), unless  the authorization is  terminated or revoked. Performed at [facility], [address] [place], [place] [address]   MRSA PCR Screening     Status: None   Collection Time: 12/25/19  7:30 AM   Specimen: Nasopharyngeal  Result Value Ref Range Status   MRSA by PCR NEGATIVE NEGATIVE Final    Comment:        The GeneXpert MRSA Assay (FDA approved for NASAL specimens only), is one component of a comprehensive MRSA colonization surveillance program. It is not intended to diagnose MRSA infection nor to guide or monitor treatment for MRSA infections. Performed at [facility], [address] [place], [place] [address]      Studies: NM GI Blood Loss  Result Date: 12/26/2019 CLINICAL DATA:  85 year old female with bright red blood per rectum between 0700 and 0800  hours today. 1 unit of blood given yesterday. History of large bowel diverticulosis. EXAM: NUCLEAR MEDICINE GASTROINTESTINAL BLEEDING SCAN TECHNIQUE: Sequential abdominal images were obtained following intravenous administration of Tc-85m labeled red blood cells. RADIOPHARMACEUTICALS:  20.4 mCi Tc-63m pertechnetate in-vitro labeled red cells. COMPARISON:  CT Abdomen and Pelvis 09/06/2018. FINDINGS: On the 1st hour of imaging normal blood pool activity is identified in the lower chest, abdomen and pelvis. No abnormal accumulation of radiotracer during the 1st hour. A 2nd hour of imaging was performed, but still only physiologic blood pool activity is identified. Minimal accumulation occurs within the urinary bladder. No activity suggestive of active GI bleeding. IMPRESSION: Negative, no evidence of active GI bleeding on tagged red blood cell scan. Electronically Signed   By: Genevie Ann M.D.   On: 12/26/2019 13:49    Scheduled Meds: . baclofen  10 mg Oral QHS  . diltiazem  180 mg Oral BID  . feeding supplement (GLUCERNA SHAKE)  237 mL Oral TID BM  . ferrous sulfate  325 mg Oral Q breakfast  . gabapentin  100 mg Oral  QHS  . insulin aspart  0-6 Units Subcutaneous TID AC & HS  . insulin glargine  10 Units Subcutaneous Daily  . multivitamin-lutein   Oral BID  . pantoprazole  40 mg Oral Daily  . rosuvastatin  5 mg Oral QODAY  . vitamin B-12  1,000 mcg Oral Daily   Continuous Infusions: . sodium chloride    . sodium chloride    . iron sucrose      Assessment/Plan:  1. Acute blood loss anemia, diverticular bleed.  If continues to have no further bleeding today and hemoglobin remained stable can potentially go home tomorrow.  If rebleeding then will have to watch further in the hospital.  Hemoglobin down to 8.3 today.  I will give IV iron today.  Holding Plavix. 2. Acute kidney injury on underlying chronic kidney disease stage IIIa 3. Essential hypertension on diltiazem 4. Chronic kidney disease stage IIIa with type 2 diabetes mellitus on glargine insulin and sliding scale 5. Hyperlipidemia unspecified on pravastatin 6. Peripheral vascular disease.  Holding Plavix  Code Status:     Code Status Orders  (From admission, onward)         Start     Ordered   12/25/19 0551  Full code  Continuous     12/25/19 0555        Code Status History    Date Active Date Inactive Code Status Order ID Comments User Context   10/12/2018 1647 10/15/2018 1808 Full Code [id number]  Katha Cabal, MD Inpatient   09/16/2018 1254 09/16/2018 2048 Full Code [id number]  Katha Cabal, MD Inpatient   08/11/2018 1706 08/13/2018 2136 Full Code [id number]  Gorden Harms, MD Inpatient   07/26/2018 0921 07/26/2018 1652 Full Code [id number]  Katha Cabal, MD Inpatient   08/23/2017 0935 08/23/2017 1419 Full Code [id number]  Algernon Huxley, MD Inpatient   02/15/2017 1538 02/16/2017 1959 Full Code [id number]  Dustin Flock, MD Inpatient   Advance Care Planning Activity    Advance Directive Documentation     Most Recent Value  Type of Advance Directive  Healthcare Power of Attorney  Pre-existing out of facility DNR  order (yellow form or pink MOST form)  --  "MOST" Form in Place?  --     Family Communication: Spoke with daughter at the bedside Disposition Plan: If the patient has a bowel movement without blood and can potentially  go home tomorrow.  If the patient has continued bleeding then will need continued monitoring here in the hospital.  Follow-up hemoglobin in the morning.  Consultants:  Gastroenterology  Time spent: 28 minutes  [place]

## 2019-12-27 NOTE — Evaluation (Signed)
Physical Therapy Evaluation Patient Details Name: Jill Hudson MRN: [id number] DOB: 1935-08-13 Today's Date: 12/27/2019   History of Present Illness  Per MD notes: Pt is an 85 y.o. female with a known history of hypertension, LLE revascularization, dyslipidemia, type 2 diabetes mellitus, coronary artery disease and peripheral arterial disease, who presented to the emergency room with acute onset of rectal bleeding while having a bowel movement.  MD assessment includes: Acute blood loss anemia, diverticular bleed, and AKI on CKD.    Clinical Impression  Pt pleasant and motivated to participate during the session.  Pt did not require physical assist with any functional task but did require extra time and effort with bed mobility and transfers and ambulated with slow, cautious cadence.  Pt presented with min instability upon initially coming to standing but was able to self-correct without assist.  Pt's SpO2 and HR were WNL during the session with no adverse symptoms other that general fatigue.  Pt reported feeling significantly weaker than at baseline but reported that each day she is feeling better since admission.  Pt will benefit from HHPT services upon discharge to safely address deficits listed in patient problem list for decreased caregiver assistance and eventual return to PLOF.      Follow Up Recommendations Home health PT    Equipment Recommendations  Rolling walker with 5" wheels    Recommendations for Other Services       Precautions / Restrictions Precautions Precautions: Fall Restrictions Weight Bearing Restrictions: No      Mobility  Bed Mobility Overal bed mobility: Modified Independent             General bed mobility comments: Extra time and effort required  Transfers Overall transfer level: Needs assistance Equipment used: Rolling walker (2 wheeled) Transfers: Sit to/from Stand Sit to Stand: Min guard         General transfer comment: Extra effort and  min instability upon coming to initial stand with pt able to self-correct  Ambulation/Gait Ambulation/Gait assistance: Min guard Gait Distance (Feet): 20 Feet Assistive device: Rolling walker (2 wheeled) Gait Pattern/deviations: Step-through pattern;Decreased step length - right;Decreased step length - left Gait velocity: decreased   General Gait Details: Short B step length and slow cadence but steady without LOB with SpO2 and HR WNL  Stairs            Wheelchair Mobility    Modified Rankin (Stroke Patients Only)       Balance Overall balance assessment: Needs assistance Sitting-balance support: Feet supported Sitting balance-Leahy Scale: Good     Standing balance support: Bilateral upper extremity supported Standing balance-Leahy Scale: Fair Standing balance comment: Min lean on RW for support                             Pertinent Vitals/Pain Pain Assessment: 0-10 Pain Score: 7  Pain Location: R shoulder, chronic per pt Pain Descriptors / Indicators: Sore Pain Intervention(s): Premedicated before session;Monitored during session    Home Living Family/patient expects to be discharged to:: Private residence Living Arrangements: Spouse/significant other;Children Available Help at Discharge: Family;Available PRN/intermittently Type of Home: House Home Access: Stairs to enter Entrance Stairs-Rails: Left Entrance Stairs-Number of Steps: 3 Home Layout: Two level;Able to live on main level with bedroom/bathroom Home Equipment: Kasandra Knudsen - single point;Walker - 4 wheels;Walker - standard      Prior Function Level of Independence: Independent         Comments: Ind amb  in home without an AD, Mod Ind amb limited community distances with a SPC, Ind with ADLs, one fall in the last 6 months     Hand Dominance        Extremity/Trunk Assessment   Upper Extremity Assessment Upper Extremity Assessment: Generalized weakness    Lower Extremity  Assessment Lower Extremity Assessment: Generalized weakness       Communication   Communication: No difficulties  Cognition Arousal/Alertness: Awake/alert Behavior During Therapy: WFL for tasks assessed/performed Overall Cognitive Status: Within Functional Limits for tasks assessed                                        General Comments      Exercises Total Joint Exercises Ankle Circles/Pumps: Strengthening;Both;5 reps;10 reps Quad Sets: Strengthening;Both;10 reps Gluteal Sets: Strengthening;Both;10 reps Heel Slides: Strengthening;Both;10 reps Hip ABduction/ADduction: Strengthening;Both;5 reps Long Arc Quad: Strengthening;Both;10 reps Knee Flexion: Strengthening;Both;10 reps Marching in Standing: AROM;Both;5 reps;Standing Other Exercises Other Exercises: Sit to/from stand training from various height surfaces Other Exercises: HEP education for BLE APs, QS, GS, and LAQs x 10 each 5-6x/day   Assessment/Plan    PT Assessment Patient needs continued PT services  PT Problem List Decreased strength;Decreased activity tolerance;Decreased balance;Decreased mobility;Decreased knowledge of use of DME       PT Treatment Interventions DME instruction;Gait training;Stair training;Functional mobility training;Therapeutic activities;Therapeutic exercise;Balance training;Patient/family education    PT Goals (Current goals can be found in the Care Plan section)  Acute Rehab PT Goals Patient Stated Goal: To get stronger PT Goal Formulation: With patient Time For Goal Achievement: 01/09/20 Potential to Achieve Goals: Good    Frequency Min 2X/week   Barriers to discharge        Co-evaluation               AM-PAC PT "6 Clicks" Mobility  Outcome Measure Help needed turning from your back to your side while in a flat bed without using bedrails?: A Little Help needed moving from lying on your back to sitting on the side of a flat bed without using bedrails?: A  Little Help needed moving to and from a bed to a chair (including a wheelchair)?: A Little Help needed standing up from a chair using your arms (e.g., wheelchair or bedside chair)?: A Little Help needed to walk in hospital room?: A Little Help needed climbing 3-5 steps with a railing? : A Little 6 Click Score: 18    End of Session Equipment Utilized During Treatment: Gait belt Activity Tolerance: Patient tolerated treatment well;No increased pain Patient left: in chair;with chair alarm set;with nursing/sitter in room;with call bell/phone within reach Nurse Communication: Mobility status;Other (comment)(Bloody stool on bed pads and in toilet) PT Visit Diagnosis: Muscle weakness (generalized) (M62.81);Difficulty in walking, not elsewhere classified (R26.2)    Time: 3818-2993 PT Time Calculation (min) (ACUTE ONLY): 29 min   Charges:   PT Evaluation $PT Eval Moderate Complexity: 1 Mod PT Treatments $Therapeutic Exercise: 8-22 mins        D. Scott Michalina Calbert PT, DPT 12/27/19, 2:22 PM

## 2019-12-28 DIAGNOSIS — K922 Gastrointestinal hemorrhage, unspecified: Secondary | ICD-10-CM

## 2019-12-28 DIAGNOSIS — E782 Mixed hyperlipidemia: Secondary | ICD-10-CM

## 2019-12-28 LAB — GLUCOSE, CAPILLARY
Glucose-Capillary: 146 mg/dL — ABNORMAL HIGH (ref 70–99)
Glucose-Capillary: 253 mg/dL — ABNORMAL HIGH (ref 70–99)

## 2019-12-28 LAB — HEMOGLOBIN: Hemoglobin: 7.9 g/dL — ABNORMAL LOW (ref 12.0–15.0)

## 2019-12-28 MED ORDER — PANTOPRAZOLE SODIUM 40 MG PO TBEC: 40.0000 mg | DELAYED_RELEASE_TABLET | Freq: Every day | ORAL | 0 refills | Status: DC

## 2019-12-28 MED ORDER — GLUCERNA SHAKE PO LIQD: 237.0000 mL | Freq: Three times a day (TID) | ORAL | 0 refills | Status: DC

## 2019-12-28 NOTE — Discharge Summary (Signed)
Jill Hudson NAME: Jill Hudson    MR#:  [id number]  DATE OF BIRTH:  [birth year]  DATE OF ADMISSION:  12/25/2019 ADMITTING PHYSICIAN: Christel Mormon, MD  DATE OF DISCHARGE: 12/28/2019  PRIMARY CARE PHYSICIAN: [name], MD    ADMISSION DIAGNOSIS:  GI bleeding [K92.2] Rectal bleeding [K62.5] GI bleed [K92.2]  DISCHARGE DIAGNOSIS:  Active Problems:   Hyperlipidemia   Essential hypertension   PVD (peripheral vascular disease) ([place])   CKD stage 3 due to type 2 diabetes mellitus (H[facility])   GI bleeding   Rectal bleeding   Acute blood loss anemia   Diverticulosis of large intestine with hemorrhage   Acute kidney injury superimposed on CKD ([place])   SECONDARY DIAGNOSIS:   Past Medical History:  Diagnosis Date  . Allergies   . Anxiety   . Arthritis    spine and shoulder  . Atherosclerosis of artery of extremity with rest pain ([place]) 10/12/2018  . Cancer ([place])    skin  . Diabetes mellitus without complication ([place])   . Heart murmur   . Hyperlipidemia   . Hypertension   . Macula lutea degeneration   . Mitral and aortic valve disease   . Myocardial infarction [facility])    may have had a "light" heart attack  . Occasional tremors   . PAD (peripheral artery disease) ([place])   . Shingles    patient unaware but daughter confirms. it was a long time ago  . Stroke [facility]) 01/2017   may have had a slight stroke  . TIA (transient ischemic attack) 01/2017  . UTI (urinary tract infection)   . Vascular disease, peripheral ([place])     HOSPITAL COURSE:   1.  Acute blood loss anemia, diverticular bleed.  The patient was given 1 unit of packed red blood cells during the hospital course.  Patient was given IV iron on 12/27/2019.  The patient's aspirin and Plavix were held.  Hemoglobin relatively stable over the past 3 days.  Case discussed with gastroenterology and okay for discharge home.  Follow-up as outpatient and recheck hemoglobin.  Continue to  be off aspirin and Plavix for at least 10 to 14 days.  Can consider going back on 1 agent after that. 2.  Acute kidney injury on underlying chronic kidney disease stage IIIa.  Patient's creatinine down to 1.09 upon discharge. 3.  Essential hypertension on diltiazem 4.  Chronic kidney disease stage IIIa with type 2 diabetes mellitus on glargine insulin and sliding scale 5.  Hyperlipidemia unspecified on pravastatin 6.  Peripheral vascular disease.  Holding aspirin and Plavix at this time 7.  Nonhemorrhagic stroke in 2018.  DISCHARGE CONDITIONS:   Satisfactory  CONSULTS OBTAINED:  Treatment Team:  Lucilla Lame, MD  DRUG ALLERGIES:   Allergies  Allergen Reactions  . Saxagliptin Diarrhea  . Epinephrine Other (See Comments)    Patient does not remember what happens when she uses this  . Atorvastatin Other (See Comments)    Muscle aches  . Codeine Other (See Comments)    Upset stomach  . Ezetimibe Other (See Comments)    Myalgias(ZETIA)  . Limonene Rash    Patient does not recall this reaction  . Nitrofurantoin Rash and Other (See Comments)    Pruitus  . Sulfa Antibiotics Rash and Other (See Comments)    Sore mouth     DISCHARGE MEDICATIONS:   Allergies as of 12/28/2019      Reactions   Saxagliptin  Diarrhea   Epinephrine Other (See Comments)   Patient does not remember what happens when she uses this   Atorvastatin Other (See Comments)   Muscle aches   Codeine Other (See Comments)   Upset stomach   Ezetimibe Other (See Comments)   Myalgias(ZETIA)   Limonene Rash   Patient does not recall this reaction   Nitrofurantoin Rash, Other (See Comments)   Pruitus   Sulfa Antibiotics Rash, Other (See Comments)   Sore mouth       Medication List    STOP taking these medications   amLODipine 2.5 MG tablet Commonly known as: NORVASC   aspirin 81 MG tablet   chlorthalidone 25 MG tablet Commonly known as: HYGROTON   clindamycin 2 % vaginal cream Commonly known as:  CLEOCIN   clopidogrel 75 MG tablet Commonly known as: PLAVIX   losartan 50 MG tablet Commonly known as: COZAAR   traMADol 50 MG tablet Commonly known as: ULTRAM     TAKE these medications   acetaminophen 650 MG CR tablet Commonly known as: TYLENOL Take 1,300 mg by mouth every 8 (eight) hours as needed for pain.   B-D ULTRAFINE III SHORT PEN 31G X 8 MM Misc Generic drug: Insulin Pen Needle USE AS DIRECTED   baclofen 10 MG tablet Commonly known as: LIORESAL TAKE 1 TABLET(10 MG) BY MOUTH THREE TIMES DAILY What changed: See the new instructions.   Basaglar KwikPen 100 UNIT/ML Inject 0.12 mLs (12 Units total) into the skin daily.   Cartia XT 180 MG 24 hr capsule Generic drug: diltiazem Take 180 mg by mouth 2 (two) times daily.   feeding supplement (GLUCERNA SHAKE) Liqd Take 237 mLs by mouth 3 (three) times daily between meals.   ferrous sulfate 325 (65 FE) MG tablet Take 325 mg by mouth daily with breakfast.   gabapentin 100 MG capsule Commonly known as: NEURONTIN TAKE 1 TO 2 CAPSULES(100 TO 200 MG) BY MOUTH AT BEDTIME What changed: See the new instructions.   insulin aspart 100 UNIT/ML FlexPen Commonly known as: NovoLOG FlexPen Inject 3 Units into the skin 3 (three) times daily with meals. At lunch What changed:   how much to take  when to take this  additional instructions   metFORMIN 500 MG 24 hr tablet Commonly known as: GLUCOPHAGE-XR TAKE 1 TABLET BY MOUTH TWICE DAILY   nitroGLYCERIN 0.4 MG SL tablet Commonly known as: NITROSTAT 1 tab under the tongue for CP, may repeat in 5 minutes up to 3 doses   pantoprazole 40 MG tablet Commonly known as: PROTONIX Take 1 tablet (40 mg total) by mouth daily. Start taking on: December 29, 2019   PRESERVISION AREDS PO Take 1 capsule by mouth 2 (two) times daily.   RA Vitamin B-12 TR 1000 MCG Tbcr Generic drug: Cyanocobalamin Take 1,000 mcg by mouth daily.   rosuvastatin 5 MG tablet Commonly known as:  CRESTOR Take 5 mg by mouth every other day.        DISCHARGE INSTRUCTIONS:   Follow-up PMD 5 days  If you experience worsening of your admission symptoms, develop shortness of breath, life threatening emergency, suicidal or homicidal thoughts you must seek medical attention immediately by calling 911 or calling your MD immediately  if symptoms less severe.  You Must read complete instructions/literature along with all the possible adverse reactions/side effects for all the Medicines you take and that have been prescribed to you. Take any new Medicines after you have completely understood and accept all the possible  adverse reactions/side effects.   Please note  You were cared for by a hospitalist during your hospital stay. If you have any questions about your discharge medications or the care you received while you were in the hospital after you are discharged, you can call the unit and asked to speak with the hospitalist on call if the hospitalist that took care of you is not available. Once you are discharged, your primary care physician will handle any further medical issues. Please note that NO REFILLS for any discharge medications will be authorized once you are discharged, as it is imperative that you return to your primary care physician (or establish a relationship with a primary care physician if you do not have one) for your aftercare needs so that they can reassess your need for medications and monitor your lab values.    Today   CHIEF COMPLAINT:   Chief Complaint  Patient presents with  . Rectal Bleeding    HISTORY OF PRESENT ILLNESS:  Kolbee Stallman  is a 85 y.o. female came in with GI bleed   VITAL SIGNS:  Blood pressure 133/61, pulse 65, temperature 98.3 F (36.8 C), temperature source Oral, resp. rate 17, height 5\' 1"  (1.549 m), weight 57.2 kg, SpO2 98 %.  I/O:    Intake/Output Summary (Last 24 hours) at 12/28/2019 1425 Last data filed at 12/28/2019 1346 Gross per  24 hour  Intake 638.6 ml  Output --  Net 638.6 ml    PHYSICAL EXAMINATION:  GENERAL:  85 y.o.-year-old patient lying in the bed with no acute distress.  EYES: Pupils equal, round, reactive to light and accommodation. No scleral icterus. Extraocular muscles intact.  HEENT: Head atraumatic, normocephalic. Oropharynx and nasopharynx clear.  NECK:  Supple, no jugular venous distention. No thyroid enlargement, no tenderness.  LUNGS: Normal breath sounds bilaterally, no wheezing, rales,rhonchi or crepitation. No use of accessory muscles of respiration.  CARDIOVASCULAR: S1, S2 normal. No murmurs, rubs, or gallops.  ABDOMEN: Soft, non-tender, non-distended. Bowel sounds present. No organomegaly or mass.  EXTREMITIES: No pedal edema, cyanosis, or clubbing.  NEUROLOGIC: Cranial nerves II through XII are intact. Muscle strength 5/5 in all extremities. Sensation intact. Gait not checked.  PSYCHIATRIC: The patient is alert and oriented x 3.  SKIN: No obvious rash, lesion, or ulcer.   DATA REVIEW:   CBC Recent Labs  Lab 12/27/19 0608 12/27/19 0608 12/28/19 0440  WBC 8.2  --   --   HGB 8.3*   < > 7.9*  HCT 25.1*  --   --   PLT 169  --   --    < > = values in this interval not displayed.    Chemistries  Recent Labs  Lab 12/26/19 0412 12/26/19 0412 12/27/19 0608  NA 141   < > 140  K 4.1   < > 3.7  CL 115*   < > 112*  CO2 18*   < > 19*  GLUCOSE 222*   < > 198*  BUN 30*   < > 31*  CREATININE 1.12*   < > 1.09*  CALCIUM 8.1*   < > 8.0*  AST 11*  --   --   ALT 11  --   --   ALKPHOS 55  --   --   BILITOT 0.8  --   --    < > = values in this interval not displayed.     Microbiology Results  Results for orders placed or performed during the hospital encounter  of 12/25/19  Respiratory Panel by RT PCR (Flu A&B, Covid) - Nasopharyngeal Swab     Status: None   Collection Time: 12/25/19  4:19 AM   Specimen: Nasopharyngeal Swab  Result Value Ref Range Status   SARS Coronavirus 2 by RT  PCR NEGATIVE NEGATIVE Final    Comment: (NOTE) SARS-CoV-2 target nucleic acids are NOT DETECTED. The SARS-CoV-2 RNA is generally detectable in upper respiratoy specimens during the acute phase of infection. The lowest concentration of SARS-CoV-2 viral copies this assay can detect is 131 copies/mL. A negative result does not preclude SARS-Cov-2 infection and should not be used as the sole basis for treatment or other patient management decisions. A negative result may occur with  improper specimen collection/handling, submission of specimen other than nasopharyngeal swab, presence of viral mutation(s) within the areas targeted by this assay, and inadequate number of viral copies (<131 copies/mL). A negative result must be combined with clinical observations, patient history, and epidemiological information. The expected result is Negative. Fact Sheet for Patients:  PinkCheek.be Fact Sheet for Healthcare Providers:  GravelBags.it This test is not yet ap proved or cleared by the [place] FDA and  has been authorized for detection and/or diagnosis of SARS-CoV-2 by FDA under an Emergency Use Authorization (EUA). This EUA will remain  in effect (meaning this test can be used) for the duration of the COVID-19 declaration under Section 564(b)(1) of the Act, 21 U.S.C. section 360bbb-3(b)(1), unless the authorization is terminated or revoked sooner.    Influenza A by PCR NEGATIVE NEGATIVE Final   Influenza B by PCR NEGATIVE NEGATIVE Final    Comment: (NOTE) The Xpert Xpress SARS-CoV-2/FLU/RSV assay is intended as an aid in  the diagnosis of influenza from Nasopharyngeal swab specimens and  should not be used as a sole basis for treatment. Nasal washings and  aspirates are unacceptable for Xpert Xpress SARS-CoV-2/FLU/RSV  testing. Fact Sheet for Patients: PinkCheek.be Fact Sheet for Healthcare  Providers: GravelBags.it This test is not yet approved or cleared by the [place] FDA and  has been authorized for detection and/or diagnosis of SARS-CoV-2 by  FDA under an Emergency Use Authorization (EUA). This EUA will remain  in effect (meaning this test can be used) for the duration of the  Covid-19 declaration under Section 564(b)(1) of the Act, 21  U.S.C. section 360bbb-3(b)(1), unless the authorization is  terminated or revoked. Performed at [facility], [address] [place], [place] [address]   MRSA PCR Screening     Status: None   Collection Time: 12/25/19  7:30 AM   Specimen: Nasopharyngeal  Result Value Ref Range Status   MRSA by PCR NEGATIVE NEGATIVE Final    Comment:        The GeneXpert MRSA Assay (FDA approved for NASAL specimens only), is one component of a comprehensive MRSA colonization surveillance program. It is not intended to diagnose MRSA infection nor to guide or monitor treatment for MRSA infections. Performed at [facility], [address] [place], [place] [address]       Management plans discussed with the patient, family and they are in agreement.  CODE STATUS:     Code Status Orders  (From admission, onward)         Start     Ordered   12/25/19 0551  Full code  Continuous     12/25/19 0555        Code Status History    Date Active Date Inactive Code Status Order  ID Comments User Context   10/12/2018 1647 10/15/2018 1808 Full Code [id number]  Katha Cabal, MD Inpatient   09/16/2018 1254 09/16/2018 2048 Full Code [id number]  Katha Cabal, MD Inpatient   08/11/2018 1706 08/13/2018 2136 Full Code [id number]  Gorden Harms, MD Inpatient   07/26/2018 0921 07/26/2018 1652 Full Code [id number]  Katha Cabal, MD Inpatient   08/23/2017 0935 08/23/2017 1419 Full Code [id number]  Algernon Huxley, MD Inpatient   02/15/2017 1538 02/16/2017 1959 Full Code [id number]  Dustin Flock, MD Inpatient   Advance Care Planning Activity    Advance Directive Documentation     Most Recent Value  Type of Advance Directive  Healthcare Power of Attorney  Pre-existing out of facility DNR order (yellow form or pink MOST form)  --  "MOST" Form in Place?  --      TOTAL TIME TAKING CARE OF THIS PATIENT: 35 minutes.    Loletha Grayer M.D on 12/28/2019 at 2:25 PM  Between 7am to 6pm - Pager - [phone]  After 6pm go to [email or web address] - password [email or web address]  [facility]  CC: Primary care physician; [name], MD

## 2019-12-28 NOTE — Progress Notes (Signed)
Jill Lame, MD [facility]   [address] [place], [place] [address] Phone: [phone] Fax : [phone]   Subjective: The patient denies any abdominal pain today. She also has not had a further signs of significant GI bleeding. There was a little blood that was passed overnight. The patient has been tolerating p.o.'s. She has no complaints today. Her hemoglobin is about the same as it has been over the last few days.  Objective: Vital signs in last 24 hours: Vitals:   12/27/19 1412 12/27/19 2021 12/28/19 0544 12/28/19 0909  BP: 126/87 (!) 178/67 (!) 141/50 (!) 158/56  Pulse: 67 70 61 72  Resp: 15 18 18    Temp: (!) 97.5 F (36.4 C) 98.4 F (36.9 C) 98 F (36.7 C)   TempSrc: Oral Oral Oral   SpO2: 98% 96% 94% 100%  Weight:      Height:       Weight change:   Intake/Output Summary (Last 24 hours) at 12/28/2019 1207 Last data filed at 12/28/2019 1038 Gross per 24 hour  Intake 518.6 ml  Output --  Net 518.6 ml     Exam: Gen: Alert and orientated x3 in no apparent distress Extremities: Without cyanosis clubbing or edema Neurological: Grossly intact.   Lab Results: @LABTEST2 @ Micro Results: Recent Results (from the past 240 hour(s))  Respiratory Panel by RT PCR (Flu A&B, Covid) - Nasopharyngeal Swab     Status: None   Collection Time: 12/25/19  4:19 AM   Specimen: Nasopharyngeal Swab  Result Value Ref Range Status   SARS Coronavirus 2 by RT PCR NEGATIVE NEGATIVE Final    Comment: (NOTE) SARS-CoV-2 target nucleic acids are NOT DETECTED. The SARS-CoV-2 RNA is generally detectable in upper respiratoy specimens during the acute phase of infection. The lowest concentration of SARS-CoV-2 viral copies this assay can detect is 131 copies/mL. A negative result does not preclude SARS-Cov-2 infection and should not be used as the sole basis for treatment or other patient management decisions. A negative result may occur with  improper specimen collection/handling,  submission of specimen other than nasopharyngeal swab, presence of viral mutation(s) within the areas targeted by this assay, and inadequate number of viral copies (<131 copies/mL). A negative result must be combined with clinical observations, patient history, and epidemiological information. The expected result is Negative. Fact Sheet for Patients:  PinkCheek.be Fact Sheet for Healthcare Providers:  GravelBags.it This test is not yet ap proved or cleared by the [place] FDA and  has been authorized for detection and/or diagnosis of SARS-CoV-2 by FDA under an Emergency Use Authorization (EUA). This EUA will remain  in effect (meaning this test can be used) for the duration of the COVID-19 declaration under Section 564(b)(1) of the Act, 21 U.S.C. section 360bbb-3(b)(1), unless the authorization is terminated or revoked sooner.    Influenza A by PCR NEGATIVE NEGATIVE Final   Influenza B by PCR NEGATIVE NEGATIVE Final    Comment: (NOTE) The Xpert Xpress SARS-CoV-2/FLU/RSV assay is intended as an aid in  the diagnosis of influenza from Nasopharyngeal swab specimens and  should not be used as a sole basis for treatment. Nasal washings and  aspirates are unacceptable for Xpert Xpress SARS-CoV-2/FLU/RSV  testing. Fact Sheet for Patients: PinkCheek.be Fact Sheet for Healthcare Providers: GravelBags.it This test is not yet approved or cleared by the [place] FDA and  has been authorized for detection and/or diagnosis of SARS-CoV-2 by  FDA under an Emergency Use Authorization (EUA). This EUA will remain  in effect (meaning this test can be used) for the duration of the  Covid-19 declaration under Section 564(b)(1) of the Act, 21  U.S.C. section 360bbb-3(b)(1), unless the authorization is  terminated or revoked. Performed at [facility], [address] [place], [place] [address]   MRSA PCR Screening     Status: None   Collection Time: 12/25/19  7:30 AM   Specimen: Nasopharyngeal  Result Value Ref Range Status   MRSA by PCR NEGATIVE NEGATIVE Final    Comment:        The GeneXpert MRSA Assay (FDA approved for NASAL specimens only), is one component of a comprehensive MRSA colonization surveillance program. It is not intended to diagnose MRSA infection nor to guide or monitor treatment for MRSA infections. Performed at [facility], [address] [place], [place] [address]    Studies/Results: NM GI Blood Loss  Result Date: 12/26/2019 CLINICAL DATA:  85 year old female with bright red blood per rectum between 0700 and 0800 hours today. 1 unit of blood given yesterday. History of large bowel diverticulosis. EXAM: NUCLEAR MEDICINE GASTROINTESTINAL BLEEDING SCAN TECHNIQUE: Sequential abdominal images were obtained following intravenous administration of Tc-29m labeled red blood cells. RADIOPHARMACEUTICALS:  20.4 mCi Tc-71m pertechnetate in-vitro labeled red cells. COMPARISON:  CT Abdomen and Pelvis 09/06/2018. FINDINGS: On the 1st hour of imaging normal blood pool activity is identified in the lower chest, abdomen and pelvis. No abnormal accumulation of radiotracer during the 1st hour. A 2nd hour of imaging was performed, but still only physiologic blood pool activity is identified. Minimal accumulation occurs within the urinary bladder. No activity suggestive of active GI bleeding. IMPRESSION: Negative, no evidence of active GI bleeding on tagged red blood cell scan. Electronically Signed   By: Genevie Ann M.D.   On: 12/26/2019 13:49   Medications: I have reviewed the patient's current medications. Scheduled Meds: . baclofen  10 mg Oral QHS  . diltiazem  180 mg Oral BID  . feeding supplement (GLUCERNA SHAKE)  237 mL Oral TID BM  . ferrous sulfate  325 mg Oral Q breakfast  . gabapentin  100 mg Oral QHS  . insulin aspart  0-6 Units  Subcutaneous TID AC & HS  . insulin glargine  10 Units Subcutaneous Daily  . multivitamin-lutein   Oral BID  . pantoprazole  40 mg Oral Daily  . rosuvastatin  5 mg Oral QODAY  . vitamin B-12  1,000 mcg Oral Daily   Continuous Infusions: . sodium chloride     PRN Meds:.acetaminophen **OR** acetaminophen, hydrALAZINE, nitroGLYCERIN, ondansetron **OR** ondansetron (ZOFRAN) IV, traZODone   Assessment: Active Problems:   Hyperlipidemia   Essential hypertension   PVD (peripheral vascular disease) ([facility])   CKD stage 3 due to type 2 diabetes mellitus ([facility])   GI bleeding   Rectal bleeding   Acute blood loss anemia   Diverticulosis of large intestine with hemorrhage   Acute kidney injury superimposed on CKD ([place])    Plan: This patient had been admitted with a GI bleed. The patient's GI bleed was likely from diverticulosis. The patient has had diverticular bleeds in the past and had a colonoscopy at the latter part of 2019. The patient has been doing well and can be discharged with outpatient follow-up. She should continue to hold her Plavix for the next 10 days at least. The patient has been explained the plan and agrees with it.   LOS: 3 days   Jill Hudson 12/28/2019, 12:07 PM Pager [phone] 7am-5pm  Check AMION for 5pm -7am coverage and on weekends

## 2019-12-28 NOTE — Care Management Important Message (Signed)
Important Message  Patient Details  Name: Jill Hudson MRN: [id number] Date of Birth: 1935-04-08   Medicare Important Message Given:  Yes     Dannette Barbara 12/28/2019, 1:48 PM

## 2019-12-28 NOTE — Progress Notes (Signed)
Jill Hudson  A and O x 4 VSS. Pt tolerating diet well. No complaints of pain or nausea. IV removed intact, prescriptions given. Pt voices understanding of discharge instructions with no further questions. Pt discharged via wheelchair with axillary.   Allergies as of 12/28/2019      Reactions   Saxagliptin Diarrhea   Epinephrine Other (See Comments)   Patient does not remember what happens when she uses this   Atorvastatin Other (See Comments)   Muscle aches   Codeine Other (See Comments)   Upset stomach   Ezetimibe Other (See Comments)   Myalgias(ZETIA)   Limonene Rash   Patient does not recall this reaction   Nitrofurantoin Rash, Other (See Comments)   Pruitus   Sulfa Antibiotics Rash, Other (See Comments)   Sore mouth       Medication List    STOP taking these medications   amLODipine 2.5 MG tablet Commonly known as: NORVASC   aspirin 81 MG tablet   chlorthalidone 25 MG tablet Commonly known as: HYGROTON   clindamycin 2 % vaginal cream Commonly known as: CLEOCIN   clopidogrel 75 MG tablet Commonly known as: PLAVIX   losartan 50 MG tablet Commonly known as: COZAAR   traMADol 50 MG tablet Commonly known as: ULTRAM     TAKE these medications   acetaminophen 650 MG CR tablet Commonly known as: TYLENOL Take 1,300 mg by mouth every 8 (eight) hours as needed for pain.   B-D ULTRAFINE III SHORT PEN 31G X 8 MM Misc Generic drug: Insulin Pen Needle USE AS DIRECTED   baclofen 10 MG tablet Commonly known as: LIORESAL TAKE 1 TABLET(10 MG) BY MOUTH THREE TIMES DAILY What changed: See the new instructions.   Basaglar KwikPen 100 UNIT/ML Inject 0.12 mLs (12 Units total) into the skin daily.   Cartia XT 180 MG 24 hr capsule Generic drug: diltiazem Take 180 mg by mouth 2 (two) times daily.   feeding supplement (GLUCERNA SHAKE) Liqd Take 237 mLs by mouth 3 (three) times daily between meals.   ferrous sulfate 325 (65 FE) MG tablet Take 325 mg by mouth daily with  breakfast.   gabapentin 100 MG capsule Commonly known as: NEURONTIN TAKE 1 TO 2 CAPSULES(100 TO 200 MG) BY MOUTH AT BEDTIME What changed: See the new instructions.   insulin aspart 100 UNIT/ML FlexPen Commonly known as: NovoLOG FlexPen Inject 3 Units into the skin 3 (three) times daily with meals. At lunch What changed:   how much to take  when to take this  additional instructions   metFORMIN 500 MG 24 hr tablet Commonly known as: GLUCOPHAGE-XR TAKE 1 TABLET BY MOUTH TWICE DAILY   nitroGLYCERIN 0.4 MG SL tablet Commonly known as: NITROSTAT 1 tab under the tongue for CP, may repeat in 5 minutes up to 3 doses   pantoprazole 40 MG tablet Commonly known as: PROTONIX Take 1 tablet (40 mg total) by mouth daily. Start taking on: December 29, 2019   PRESERVISION AREDS PO Take 1 capsule by mouth 2 (two) times daily.   RA Vitamin B-12 TR 1000 MCG Tbcr Generic drug: Cyanocobalamin Take 1,000 mcg by mouth daily.   rosuvastatin 5 MG tablet Commonly known as: CRESTOR Take 5 mg by mouth every other day.       Vitals:   12/28/19 0909 12/28/19 1223  BP: (!) 158/56 133/61  Pulse: 72 65  Resp:  17  Temp:  98.3 F (36.8 C)  SpO2: 100% 98%  Cloretta Ned

## 2019-12-28 NOTE — TOC Transition Note (Signed)
Transition of Care [facility]) - CM/SW Discharge Note   Patient Details  Name: Jill Hudson MRN: [id number] Date of Birth: 26-Aug-1935  Transition of Care [facility]) CM/SW Contact:  Beverly Sessions, RN Phone Number: 12/28/2019, 1:12 PM   Clinical Narrative:    Patient to discharge home today Corene Cornea with [place] notified of discharge    Final next level of care: [place] Barriers to Discharge: No Barriers Identified   Patient Goals and CMS Choice     Choice offered to / list presented to : Patient  Discharge Placement                       Discharge Plan and Services   Discharge Planning Services: CM Consult Post Acute Care Choice: Home Health                    HH Arranged: PT [facility] Agency: [place] ([place]) Date [facility] Agency Contacted: 12/28/19 Time [place]: [place] Representative spoke with at [place]: [place] ([place]) Interventions     Readmission Risk Interventions Readmission Risk Prevention Plan 10/15/2018  Transportation Screening Complete  PCP or Specialist Appt within 5-7 Days Complete  Home Care Screening Complete  Medication Review (RN CM) Complete  Some recent data might be hidden

## 2019-12-29 ENCOUNTER — Telehealth: Payer: Self-pay

## 2019-12-29 DIAGNOSIS — Z79899 Other long term (current) drug therapy: Secondary | ICD-10-CM | POA: Diagnosis not present

## 2019-12-29 DIAGNOSIS — E1151 Type 2 diabetes mellitus with diabetic peripheral angiopathy without gangrene: Secondary | ICD-10-CM | POA: Diagnosis not present

## 2019-12-29 DIAGNOSIS — I70229 Atherosclerosis of native arteries of extremities with rest pain, unspecified extremity: Secondary | ICD-10-CM | POA: Diagnosis not present

## 2019-12-29 DIAGNOSIS — Z9181 History of falling: Secondary | ICD-10-CM | POA: Diagnosis not present

## 2019-12-29 DIAGNOSIS — I2510 Atherosclerotic heart disease of native coronary artery without angina pectoris: Secondary | ICD-10-CM | POA: Diagnosis not present

## 2019-12-29 DIAGNOSIS — Z8673 Personal history of transient ischemic attack (TIA), and cerebral infarction without residual deficits: Secondary | ICD-10-CM | POA: Diagnosis not present

## 2019-12-29 DIAGNOSIS — K625 Hemorrhage of anus and rectum: Secondary | ICD-10-CM | POA: Diagnosis not present

## 2019-12-29 DIAGNOSIS — R011 Cardiac murmur, unspecified: Secondary | ICD-10-CM | POA: Diagnosis not present

## 2019-12-29 DIAGNOSIS — I10 Essential (primary) hypertension: Secondary | ICD-10-CM | POA: Diagnosis not present

## 2019-12-29 DIAGNOSIS — D62 Acute posthemorrhagic anemia: Secondary | ICD-10-CM | POA: Diagnosis not present

## 2019-12-29 DIAGNOSIS — N179 Acute kidney failure, unspecified: Secondary | ICD-10-CM | POA: Diagnosis not present

## 2019-12-29 DIAGNOSIS — Z794 Long term (current) use of insulin: Secondary | ICD-10-CM | POA: Diagnosis not present

## 2019-12-29 DIAGNOSIS — E1142 Type 2 diabetes mellitus with diabetic polyneuropathy: Secondary | ICD-10-CM | POA: Diagnosis not present

## 2019-12-29 DIAGNOSIS — R251 Tremor, unspecified: Secondary | ICD-10-CM | POA: Diagnosis not present

## 2019-12-29 DIAGNOSIS — Z87891 Personal history of nicotine dependence: Secondary | ICD-10-CM | POA: Diagnosis not present

## 2019-12-29 DIAGNOSIS — K5731 Diverticulosis of large intestine without perforation or abscess with bleeding: Secondary | ICD-10-CM | POA: Diagnosis not present

## 2019-12-29 DIAGNOSIS — E1122 Type 2 diabetes mellitus with diabetic chronic kidney disease: Secondary | ICD-10-CM | POA: Diagnosis not present

## 2019-12-29 DIAGNOSIS — N1831 Chronic kidney disease, stage 3a: Secondary | ICD-10-CM | POA: Diagnosis not present

## 2019-12-29 DIAGNOSIS — M1990 Unspecified osteoarthritis, unspecified site: Secondary | ICD-10-CM | POA: Diagnosis not present

## 2019-12-29 DIAGNOSIS — I080 Rheumatic disorders of both mitral and aortic valves: Secondary | ICD-10-CM | POA: Diagnosis not present

## 2019-12-29 DIAGNOSIS — H3530 Unspecified macular degeneration: Secondary | ICD-10-CM | POA: Diagnosis not present

## 2019-12-29 DIAGNOSIS — F419 Anxiety disorder, unspecified: Secondary | ICD-10-CM | POA: Diagnosis not present

## 2019-12-29 DIAGNOSIS — Z85828 Personal history of other malignant neoplasm of skin: Secondary | ICD-10-CM | POA: Diagnosis not present

## 2019-12-29 DIAGNOSIS — Z87440 Personal history of urinary (tract) infections: Secondary | ICD-10-CM | POA: Diagnosis not present

## 2019-12-29 DIAGNOSIS — E785 Hyperlipidemia, unspecified: Secondary | ICD-10-CM | POA: Diagnosis not present

## 2019-12-29 NOTE — Telephone Encounter (Signed)
Transition Care Management Follow-up Telephone Call  Date of discharge and from where: 12/28/19 [facility]  How have you been since you were released from the hospital? Pt states she is doing okay, still feeling weak.   Any questions or concerns? No   [facility] coming today.   Items Reviewed:  Did the pt receive and understand the discharge instructions provided? Yes   Medications obtained and verified? Yes   Any new allergies since your discharge? No   Dietary orders reviewed? Yes heart healthy  Do you have support at home? Yes   Functional Questionnaire: (I = Independent and D = Dependent) ADLs: I  Bathing/Dressing- I  Meal Prep- I  Eating- I  Maintaining continence- I  Transferring/Ambulation- I  Managing Meds- I  Follow up appointments reviewed:   PCP Hospital f/u appt confirmed? Yes  Scheduled to see Dr. Army Melia on 01/08/20 @ 3:00.  Are transportation arrangements needed? No   If their condition worsens, is the pt aware to call PCP or go to the Emergency Dept.? Yes  Was the patient provided with contact information for the PCP's office or ED? Yes  Was to pt encouraged to call back with questions or concerns? Yes

## 2020-01-01 ENCOUNTER — Ambulatory Visit: Payer: Self-pay | Admitting: General Practice

## 2020-01-01 ENCOUNTER — Telehealth: Payer: Self-pay | Admitting: Internal Medicine

## 2020-01-01 DIAGNOSIS — E1122 Type 2 diabetes mellitus with diabetic chronic kidney disease: Secondary | ICD-10-CM

## 2020-01-01 DIAGNOSIS — I10 Essential (primary) hypertension: Secondary | ICD-10-CM

## 2020-01-01 DIAGNOSIS — K5791 Diverticulosis of intestine, part unspecified, without perforation or abscess with bleeding: Secondary | ICD-10-CM

## 2020-01-01 DIAGNOSIS — E782 Mixed hyperlipidemia: Secondary | ICD-10-CM

## 2020-01-01 DIAGNOSIS — E118 Type 2 diabetes mellitus with unspecified complications: Secondary | ICD-10-CM

## 2020-01-01 DIAGNOSIS — N183 Chronic kidney disease, stage 3 unspecified: Secondary | ICD-10-CM

## 2020-01-01 NOTE — Telephone Encounter (Signed)
Patient called regarding medication changes made at discharge.  Plavix and aspirin were stopped - to be assessed at follow up.  Amlodipine and losartan were also stopped.  BP has been up to 150 recently. She is instructed to resume losartan 50 mg daily for 5 days then increase to bid if needed. Will discuss blood thinners at hospital follow up on 01/08/20.

## 2020-01-01 NOTE — Chronic Care Management (AMB) (Signed)
Chronic Care Management   Initial Visit Note  01/01/2020 Name: Jill Hudson MRN: [id number] DOB: 08-Jan-1935  Referred by: Jill Hess, MD Reason for referral : Chronic Care Management (Post discharge follow up and Chronic Disease Management and Care Coordination Needs)   Jill WITHEM is a 85 y.o. year old female who is a primary care patient of Jill Hess, MD. The CCM team was consulted for assistance with chronic disease management and care coordination needs related to HTN, HLD, DMII, CKD Stage 3 and Acute blood loss anemia, diverticular bleed/GI Bleed  Review of patient status, including review of consultants reports, relevant laboratory and other test results, and collaboration with appropriate care team members and the patient's provider was performed as part of comprehensive patient evaluation and provision of chronic care management services.    SDOH (Social Determinants of Health) assessments performed: Yes See Care Plan activities for detailed interventions related to SDOH)  SDOH Interventions     Most Recent Value  SDOH Interventions  Depression Interventions/Treatment   Counseling [The patient is aware of resources available to her]       Medications: Outpatient Encounter Medications as of 01/01/2020  Medication Sig   acetaminophen (TYLENOL) 650 MG CR tablet Take 1,300 mg by mouth every 8 (eight) hours as needed for pain.   B-D ULTRAFINE III SHORT PEN 31G X 8 MM MISC USE AS DIRECTED   baclofen (LIORESAL) 10 MG tablet TAKE 1 TABLET(10 MG) BY MOUTH THREE TIMES DAILY (Patient taking differently: Take 10 mg by mouth at bedtime. )   Cyanocobalamin (RA VITAMIN B-12 TR) 1000 MCG TBCR Take 1,000 mcg by mouth daily.    diltiazem (CARTIA XT) 180 MG 24 hr capsule Take 180 mg by mouth 2 (two) times daily.    feeding supplement, GLUCERNA SHAKE, (GLUCERNA SHAKE) LIQD Take 237 mLs by mouth 3 (three) times daily between meals.   ferrous sulfate 325 (65 FE) MG tablet Take  325 mg by mouth daily with breakfast.   gabapentin (NEURONTIN) 100 MG capsule TAKE 1 TO 2 CAPSULES(100 TO 200 MG) BY MOUTH AT BEDTIME (Patient taking differently: Take 100-200 mg by mouth at bedtime. )   insulin aspart (NOVOLOG FLEXPEN) 100 UNIT/ML FlexPen Inject 3 Units into the skin 3 (three) times daily with meals. At lunch (Patient taking differently: Inject 2 Units into the skin daily. At breakfast)   Insulin Glargine (BASAGLAR KWIKPEN) 100 UNIT/ML SOPN Inject 0.12 mLs (12 Units total) into the skin daily.   metFORMIN (GLUCOPHAGE-XR) 500 MG 24 hr tablet TAKE 1 TABLET BY MOUTH TWICE DAILY   Multiple Vitamins-Minerals (PRESERVISION AREDS PO) Take 1 capsule by mouth 2 (two) times daily.    nitroGLYCERIN (NITROSTAT) 0.4 MG SL tablet 1 tab under the tongue for CP, may repeat in 5 minutes up to 3 doses   pantoprazole (PROTONIX) 40 MG tablet Take 1 tablet (40 mg total) by mouth daily.   rosuvastatin (CRESTOR) 5 MG tablet Take 5 mg by mouth every other day.    No facility-administered encounter medications on file as of 01/01/2020.     Objective:    BP Readings from Last 3 Encounters:  12/28/19 133/61  11/30/19 (!) 183/61  10/02/19 130/72   Lab Results  Component Value Date   HGBA1C 6.5 10/16/2019    Goals Addressed            This Visit's Progress    RNCM: Pt-"I had to get blood" (pt-stated)       CARE  PLAN ENTRY (see longtitudinal plan of care for additional care plan information)   Current Barriers:   Knowledge Deficits related to recent hospitalization for acute onset of of GI Bleed as evidence of acute blood loss anemia, diverticular bleed (hospitalization 12/25/2019 to 12/28/2019)  Nurse Case Manager Clinical Goal(s):   Over the next 60 days, patient will work with [facility] and pcp  to address needs related to GI bleed and preventing readmit for acute blood loss/diverticular bleed  Over the next 120 days, patient will demonstrate a decrease in GI bleed  exacerbations as  evidenced by no new issues with acute blood loss   Over the next 60 days, patient will attend all scheduled medical appointments: Next appointment with Dr. Army Melia for post discharge follow up on 01/08/2020  Over the next 120 days, the patient will demonstrate ongoing self health care management ability as evidenced by following recommended plan of care established by the pcp, and  calling for changes in condition.  Interventions:   Evaluation of current treatment plan related to GI bleed and review of post discharge instrcutions and patient's adherence to plan as established by provider.  Advised patient to call the home health agency if she has not heard from them by tomorrow am, the patient has the number and knows how to reach  Provided education to patient re: diverticulitis and foods to avoid to prevent further complications   Reviewed medications with patient and discussed compliance- per the patient she spoke to Dr. Army Melia today concerning resuming some of her medications that had been stopped. Knows to call for any questions or concerns  Discussed plans with patient for ongoing care management follow up and provided patient with direct contact information for care management team  Provided patient with dietary educational materials related to diverticular issues and foods to avoid by the myChart system  Reviewed scheduled/upcoming provider appointments including: upcoming post discharge appointment on 01/08/2020 with pcp  Patient Self Care Activities:   Patient verbalizes understanding of plan to call home health to validate schedule and to call CCM team or pcp as needed for assistance   Attends all scheduled provider appointments  Calls provider office for new concerns or questions  Unable to independently manage diverticulitis as evidence by recent hospitalization due to acute blood loss anemia, diverticular bleed  Initial goal documentation      RNCM: Pt-"My blood  pressure was high this am so I called the doctor"       [place] (see longtitudinal plan of care for additional care plan information)  Current Barriers:   Chronic Disease Management support, education, and care coordination needs related to HTN, HLD, DMII, and CKD Stage 3  Clinical Goal(s) related to HTN, HLD, DMII, and CKD Stage 3 :  Over the next 120 days, patient will:   Work with the care management team to address educational, disease management, and care coordination needs   Begin or continue self health monitoring activities as directed today Measure and record cbg (blood glucose) 3 times daily, Measure and record blood pressure 5 times per week, and adhere to a Heart healthy/ADA diet  Call provider office for new or worsened signs and symptoms Blood glucose findings outside established parameters, Blood pressure findings outside established parameters, and New or worsened symptom related to CKD3 and HLD  Call care management team with questions or concerns  Verbalize basic understanding of patient centered plan of care established today  Interventions related to HTN, HLD, DMII, and CKD Stage  3 :   Evaluation of current treatment plans and patient's adherence to plan as established by provider  Assessed patient understanding of disease states  Assessed patient's education and care coordination needs  Provided disease specific education to patient- review of Heart healthy/ADA diet, discussed special consideration due to recent GI Bleed and patient was told to increase fiber.  Collaborated with appropriate clinical care team members regarding patient needs  Patient Self Care Activities related to HTN, HLD, DMII, and CKD Stage 3 :   Patient is unable to independently self-manage chronic health conditions  Initial goal documentation         Ms. Namba was given information about Chronic Care Management services today including:  1. CCM service includes  personalized support from designated clinical staff supervised by her physician, including individualized plan of care and coordination with other care providers 2. 24/7 contact phone numbers for assistance for urgent and routine care needs. 3. Service will only be billed when office clinical staff spend 20 minutes or more in a month to coordinate care. 4. Only one practitioner may furnish and bill the service in a calendar month. 5. The patient may stop CCM services at any time (effective at the end of the month) by phone call to the office staff. 6. The patient will be responsible for cost sharing (co-pay) of up to 20% of the service fee (after annual deductible is met).  Patient agreed to services and verbal consent obtained.   Plan:   The care management team will reach out to the patient again over the next 60 days.   Noreene Larsson RN, MSN, [place] [facility] Mobile: [phone]

## 2020-01-01 NOTE — Patient Instructions (Addendum)
Visit Information  Goals Addressed            This Visit's Progress   . RNCM: Pt-"I had to get blood" (pt-stated)       CARE PLAN ENTRY (see longtitudinal plan of care for additional care plan information)   Current Barriers:  Marland Kitchen Knowledge Deficits related to recent hospitalization for acute onset of of GI Bleed as evidence of acute blood loss anemia, diverticular bleed (hospitalization 12/25/2019 to 12/28/2019)  Nurse Case Manager Clinical Goal(s):  Marland Kitchen Over the next 60 days, patient will work with [facility] and pcp  to address needs related to GI bleed and preventing readmit for acute blood loss/diverticular bleed . Over the next 120 days, patient will demonstrate a decrease in GI bleed  exacerbations as evidenced by no new issues with acute blood loss  . Over the next 60 days, patient will attend all scheduled medical appointments: Next appointment with Dr. Army Melia for post discharge follow up on 01/08/2020 . Over the next 120 days, the patient will demonstrate ongoing self health care management ability as evidenced by following recommended plan of care established by the pcp, and  calling for changes in condition.  Interventions:  . Evaluation of current treatment plan related to GI bleed and review of post discharge instrcutions and patient's adherence to plan as established by provider. . Advised patient to call the home health agency if she has not heard from them by tomorrow am, the patient has the number and knows how to reach . Provided education to patient re: diverticulitis and foods to avoid to prevent further complications  . Reviewed medications with patient and discussed compliance- per the patient she spoke to Dr. Army Melia today concerning resuming some of her medications that had been stopped. Knows to call for any questions or concerns . Discussed plans with patient for ongoing care management follow up and provided patient with direct contact information for care management  team . Provided patient with dietary educational materials related to diverticular issues and foods to avoid by the myChart system . Reviewed scheduled/upcoming provider appointments including: upcoming post discharge appointment on 01/08/2020 with pcp  Patient Self Care Activities:  . Patient verbalizes understanding of plan to call home health to validate schedule and to call CCM team or pcp as needed for assistance  . Attends all scheduled provider appointments . Calls provider office for new concerns or questions . Unable to independently manage diverticulitis as evidence by recent hospitalization due to acute blood loss anemia, diverticular bleed  Initial goal documentation     . RNCM: Pt-"My blood pressure was high this am so I called the doctor"       [place] (see longtitudinal plan of care for additional care plan information)  Current Barriers:  . Chronic Disease Management support, education, and care coordination needs related to HTN, HLD, DMII, and CKD Stage 3  Clinical Goal(s) related to HTN, HLD, DMII, and CKD Stage 3 :  Over the next 120 days, patient will:  . Work with the care management team to address educational, disease management, and care coordination needs  . Begin or continue self health monitoring activities as directed today Measure and record cbg (blood glucose) 3 times daily, Measure and record blood pressure 5 times per week, and adhere to a Heart healthy/ADA diet . Call provider office for new or worsened signs and symptoms Blood glucose findings outside established parameters, Blood pressure findings outside established parameters, and New or worsened symptom related  to CKD3 and HLD . Call care management team with questions or concerns . Verbalize basic understanding of patient centered plan of care established today  Interventions related to HTN, HLD, DMII, and CKD Stage 3 :  . Evaluation of current treatment plans and patient's adherence to plan  as established by provider . Assessed patient understanding of disease states . Assessed patient's education and care coordination needs . Provided disease specific education to patient- review of Heart healthy/ADA diet, discussed special consideration due to recent GI Bleed and patient was told to increase fiber. [name] with appropriate clinical care team members regarding patient needs  Patient Self Care Activities related to HTN, HLD, DMII, and CKD Stage 3 :  . Patient is unable to independently self-manage chronic health conditions  Initial goal documentation        Jill Hudson was given information about Chronic Care Management services today including:  1. CCM service includes personalized support from designated clinical staff supervised by her physician, including individualized plan of care and coordination with other care providers 2. 24/7 contact phone numbers for assistance for urgent and routine care needs. 3. Service will only be billed when office clinical staff spend 20 minutes or more in a month to coordinate care. 4. Only one practitioner may furnish and bill the service in a calendar month. 5. The patient may stop CCM services at any time (effective at the end of the month) by phone call to the office staff. 6. The patient will be responsible for cost sharing (co-pay) of up to 20% of the service fee (after annual deductible is met).  Patient agreed to services and verbal consent obtained.   Patient verbalizes understanding of instructions provided today.   The care management team will reach out to the patient again over the next 60 days.   Noreene Larsson RN, MSN, [facility] Mobile: [phone]  Diverticulitis  Diverticulitis is when small pockets in your large intestine (colon) get infected or swollen. This causes stomach pain and watery poop (diarrhea). These pouches are called  diverticula. They form in people who have a condition called diverticulosis. Follow these instructions at home: Medicines  Take over-the-counter and prescription medicines only as told by your doctor. These include: ? Antibiotics. ? Pain medicines. ? Fiber pills. ? Probiotics. ? Stool softeners.  Do not drive or use heavy machinery while taking prescription pain medicine.  If you were prescribed an antibiotic, take it as told. Do not stop taking it even if you feel better. General instructions   Follow a diet as told by your doctor.  When you feel better, your doctor may tell you to change your diet. You may need to eat a lot of fiber. Fiber makes it easier to poop (have bowel movements). Healthy foods with fiber include: ? Berries. ? Beans. ? Lentils. ? Green vegetables.  Exercise 3 or more times a week. Aim for 30 minutes each time. Exercise enough to sweat and make your heart beat faster.  Keep all follow-up visits as told. This is important. You may need to have an exam of the large intestine. This is called a colonoscopy. Contact a doctor if:  Your pain does not get better.  You have a hard time eating or drinking.  You are not pooping like normal. Get help right away if:  Your pain gets worse.  Your problems do not get better.  Your problems get worse very  fast.  You have a fever.  You throw up (vomit) more than one time.  You have poop that is: ? Bloody. ? Black. ? Tarry. Summary  Diverticulitis is when small pockets in your large intestine (colon) get infected or swollen.  Take medicines only as told by your doctor.  Follow a diet as told by your doctor. This information is not intended to replace advice given to you by your health care provider. Make sure you discuss any questions you have with your health care provider. Document Revised: 09/24/2017 Document Reviewed: 10/29/2016 Elsevier Patient Education  [place].

## 2020-01-02 DIAGNOSIS — I739 Peripheral vascular disease, unspecified: Secondary | ICD-10-CM | POA: Diagnosis not present

## 2020-01-02 DIAGNOSIS — E1142 Type 2 diabetes mellitus with diabetic polyneuropathy: Secondary | ICD-10-CM | POA: Diagnosis not present

## 2020-01-02 DIAGNOSIS — N1831 Chronic kidney disease, stage 3a: Secondary | ICD-10-CM | POA: Diagnosis not present

## 2020-01-02 DIAGNOSIS — N179 Acute kidney failure, unspecified: Secondary | ICD-10-CM | POA: Diagnosis not present

## 2020-01-02 DIAGNOSIS — K922 Gastrointestinal hemorrhage, unspecified: Secondary | ICD-10-CM | POA: Diagnosis not present

## 2020-01-02 DIAGNOSIS — K5731 Diverticulosis of large intestine without perforation or abscess with bleeding: Secondary | ICD-10-CM | POA: Diagnosis not present

## 2020-01-02 DIAGNOSIS — K625 Hemorrhage of anus and rectum: Secondary | ICD-10-CM | POA: Diagnosis not present

## 2020-01-02 DIAGNOSIS — D649 Anemia, unspecified: Secondary | ICD-10-CM | POA: Diagnosis not present

## 2020-01-02 DIAGNOSIS — E1122 Type 2 diabetes mellitus with diabetic chronic kidney disease: Secondary | ICD-10-CM | POA: Diagnosis not present

## 2020-01-02 DIAGNOSIS — I2510 Atherosclerotic heart disease of native coronary artery without angina pectoris: Secondary | ICD-10-CM | POA: Diagnosis not present

## 2020-01-02 DIAGNOSIS — I10 Essential (primary) hypertension: Secondary | ICD-10-CM | POA: Diagnosis not present

## 2020-01-02 DIAGNOSIS — N1832 Chronic kidney disease, stage 3b: Secondary | ICD-10-CM | POA: Diagnosis not present

## 2020-01-03 ENCOUNTER — Telehealth (INDEPENDENT_AMBULATORY_CARE_PROVIDER_SITE_OTHER): Payer: Self-pay

## 2020-01-03 NOTE — Telephone Encounter (Signed)
Tyson Dense Called  For  Dr. Thurston Hole or his nurse  Wanting to know about after the pt having a GI bleed having Anti Platelet  Therapy for PAD. Please advise.

## 2020-01-04 DIAGNOSIS — E1122 Type 2 diabetes mellitus with diabetic chronic kidney disease: Secondary | ICD-10-CM | POA: Diagnosis not present

## 2020-01-04 DIAGNOSIS — E1142 Type 2 diabetes mellitus with diabetic polyneuropathy: Secondary | ICD-10-CM | POA: Diagnosis not present

## 2020-01-04 DIAGNOSIS — N1831 Chronic kidney disease, stage 3a: Secondary | ICD-10-CM | POA: Diagnosis not present

## 2020-01-04 DIAGNOSIS — K625 Hemorrhage of anus and rectum: Secondary | ICD-10-CM | POA: Diagnosis not present

## 2020-01-04 DIAGNOSIS — K5731 Diverticulosis of large intestine without perforation or abscess with bleeding: Secondary | ICD-10-CM | POA: Diagnosis not present

## 2020-01-04 DIAGNOSIS — N179 Acute kidney failure, unspecified: Secondary | ICD-10-CM | POA: Diagnosis not present

## 2020-01-05 NOTE — Telephone Encounter (Signed)
I called Mrs.saunders back and left a message with the above information on her VM about the pt.

## 2020-01-05 NOTE — Telephone Encounter (Signed)
The patient has extensive PAD, I believe that she was on DAPT, per Dr. [name] if she is stable from a GI standpoint, she can stay on Plavix and stop ASA

## 2020-01-06 ENCOUNTER — Other Ambulatory Visit: Payer: Self-pay

## 2020-01-06 ENCOUNTER — Ambulatory Visit: Payer: Medicare Other

## 2020-01-06 ENCOUNTER — Encounter: Payer: Self-pay | Admitting: Emergency Medicine

## 2020-01-06 ENCOUNTER — Ambulatory Visit
Admission: EM | Admit: 2020-01-06 | Discharge: 2020-01-06 | Disposition: A | Discharge status: Home / Self Care | Payer: Medicare Other | Attending: Family Medicine | Admitting: Family Medicine

## 2020-01-06 DIAGNOSIS — Z7982 Long term (current) use of aspirin: Secondary | ICD-10-CM | POA: Insufficient documentation

## 2020-01-06 DIAGNOSIS — N183 Chronic kidney disease, stage 3 unspecified: Secondary | ICD-10-CM | POA: Insufficient documentation

## 2020-01-06 DIAGNOSIS — S0003XA Contusion of scalp, initial encounter: Secondary | ICD-10-CM | POA: Insufficient documentation

## 2020-01-06 DIAGNOSIS — E1122 Type 2 diabetes mellitus with diabetic chronic kidney disease: Secondary | ICD-10-CM | POA: Diagnosis not present

## 2020-01-06 DIAGNOSIS — I70219 Atherosclerosis of native arteries of extremities with intermittent claudication, unspecified extremity: Secondary | ICD-10-CM | POA: Diagnosis not present

## 2020-01-06 DIAGNOSIS — Z87891 Personal history of nicotine dependence: Secondary | ICD-10-CM | POA: Insufficient documentation

## 2020-01-06 DIAGNOSIS — Z79899 Other long term (current) drug therapy: Secondary | ICD-10-CM | POA: Insufficient documentation

## 2020-01-06 DIAGNOSIS — I129 Hypertensive chronic kidney disease with stage 1 through stage 4 chronic kidney disease, or unspecified chronic kidney disease: Secondary | ICD-10-CM | POA: Diagnosis not present

## 2020-01-06 DIAGNOSIS — K219 Gastro-esophageal reflux disease without esophagitis: Secondary | ICD-10-CM | POA: Insufficient documentation

## 2020-01-06 DIAGNOSIS — Z8673 Personal history of transient ischemic attack (TIA), and cerebral infarction without residual deficits: Secondary | ICD-10-CM | POA: Insufficient documentation

## 2020-01-06 DIAGNOSIS — Z794 Long term (current) use of insulin: Secondary | ICD-10-CM | POA: Insufficient documentation

## 2020-01-06 DIAGNOSIS — Z882 Allergy status to sulfonamides status: Secondary | ICD-10-CM | POA: Insufficient documentation

## 2020-01-06 DIAGNOSIS — Z7902 Long term (current) use of antithrombotics/antiplatelets: Secondary | ICD-10-CM | POA: Diagnosis not present

## 2020-01-06 DIAGNOSIS — I2510 Atherosclerotic heart disease of native coronary artery without angina pectoris: Secondary | ICD-10-CM | POA: Insufficient documentation

## 2020-01-06 DIAGNOSIS — Z833 Family history of diabetes mellitus: Secondary | ICD-10-CM | POA: Diagnosis not present

## 2020-01-06 DIAGNOSIS — E1151 Type 2 diabetes mellitus with diabetic peripheral angiopathy without gangrene: Secondary | ICD-10-CM | POA: Insufficient documentation

## 2020-01-06 DIAGNOSIS — S0990XA Unspecified injury of head, initial encounter: Secondary | ICD-10-CM | POA: Insufficient documentation

## 2020-01-06 DIAGNOSIS — I6782 Cerebral ischemia: Secondary | ICD-10-CM | POA: Insufficient documentation

## 2020-01-06 DIAGNOSIS — Z885 Allergy status to narcotic agent status: Secondary | ICD-10-CM | POA: Diagnosis not present

## 2020-01-06 DIAGNOSIS — E1136 Type 2 diabetes mellitus with diabetic cataract: Secondary | ICD-10-CM | POA: Diagnosis not present

## 2020-01-06 DIAGNOSIS — W19XXXA Unspecified fall, initial encounter: Secondary | ICD-10-CM

## 2020-01-06 DIAGNOSIS — W06XXXA Fall from bed, initial encounter: Secondary | ICD-10-CM | POA: Insufficient documentation

## 2020-01-06 DIAGNOSIS — Z881 Allergy status to other antibiotic agents status: Secondary | ICD-10-CM | POA: Insufficient documentation

## 2020-01-06 DIAGNOSIS — E785 Hyperlipidemia, unspecified: Secondary | ICD-10-CM | POA: Diagnosis not present

## 2020-01-06 DIAGNOSIS — R519 Headache, unspecified: Secondary | ICD-10-CM | POA: Diagnosis not present

## 2020-01-06 DIAGNOSIS — R220 Localized swelling, mass and lump, head: Secondary | ICD-10-CM | POA: Diagnosis not present

## 2020-01-06 NOTE — Discharge Instructions (Signed)
Ice, over the counter tylenol

## 2020-01-06 NOTE — ED Triage Notes (Signed)
Patient states that she was getting out of bed this morning and fell and hit the top of her head on the dresser.  Patient c/o sore bump on the top of her head.  Patient denies any pain anywhere else.  Patient denies LOC.

## 2020-01-06 NOTE — ED Provider Notes (Signed)
[facility]    CSN: [id number] Arrival date & time: 01/06/20  1335      History   Chief Complaint Chief Complaint  Patient presents with  . Fall  . Head Injury    HPI Jill Hudson is a 85 y.o. female.   85 yo female with a c/o headache and scalp bump after falling and hitting her head this morning. States she was lying on her bed and rolled off the edge of her head hitting her head on a dresser drawer. Denies loss of consciousness, vomiting, vision changes, numbness/tingling, unilateral weakness, slurred speech. Patient has a h/o GI bleed and states also currently takes Plavix.    Fall  Head Injury   Past Medical History:  Diagnosis Date  . Allergies   . Anxiety   . Arthritis    spine and shoulder  . Atherosclerosis of artery of extremity with rest pain ([place]) 10/12/2018  . Cancer ([place])    skin  . Diabetes mellitus without complication ([place])   . Heart murmur   . Hyperlipidemia   . Hypertension   . Macula lutea degeneration   . Mitral and aortic valve disease   . Myocardial infarction [facility])    may have had a "light" heart attack  . Occasional tremors   . PAD (peripheral artery disease) ([place])   . Shingles    patient unaware but daughter confirms. it was a long time ago  . Stroke [facility]) 01/2017   may have had a slight stroke  . TIA (transient ischemic attack) 01/2017  . UTI (urinary tract infection)   . Vascular disease, peripheral [facility])     Patient Active Problem List   Diagnosis Date Noted  . Acute blood loss anemia   . Diverticulosis of large intestine with hemorrhage   . Acute kidney injury superimposed on CKD ([place])   . GI bleeding 12/25/2019  . Rectal bleeding   . Age-related macular degeneration, dry, left eye 06/21/2019  . Age-related macular degeneration, wet, right eye ([place]) 06/21/2019  . Moderate nonproliferative diabetic retinopathy associated with type 2 diabetes mellitus ([place]) 06/21/2019  . Lumbosacral radiculopathy at L4  05/01/2019  . Underweight 05/01/2019  . Encounter for long-term (current) use of aspirin 10/31/2018  . Encounter for long-term (current) use of antiplatelets/antithrombotics 10/31/2018  . Long term current use of oral hypoglycemic drug 10/31/2018  . Encounter for long-term (current) use of insulin ([place]) 10/31/2018  . GIB (gastrointestinal bleeding) 08/11/2018  . Leg pain 07/03/2017  . Carpal tunnel syndrome on both sides 05/05/2017  . Myalgia due to HMG CoA reductase inhibitor 05/05/2017  . History of CVA (cerebrovascular accident) 02/15/2017  . Degenerative disc disease, lumbar 12/21/2016  . Atherosclerosis of native arteries of extremity with intermittent claudication ([place]) 12/20/2016  . Bilateral carotid artery stenosis 12/20/2016  . Hip bursitis 05/18/2016  . Elevated TSH 01/18/2016  . CKD stage 3 due to type 2 diabetes mellitus ([place]) 01/16/2016  . Senile ecchymosis 01/16/2016  . Type II diabetes mellitus with complication ([place]) 53/97/6734  . GERD (gastroesophageal reflux disease) 07/24/2015  . PVD (peripheral vascular disease) ([place]) 05/17/2015  . Neoplasm of uncertain behavior of skin 05/17/2015  . Retinopathy, diabetic, proliferative ([place]) 04/11/2015  . Hyperlipidemia 04/11/2015  . Essential hypertension 04/11/2015  . Generalized OA 04/11/2015  . Arteriosclerosis of coronary artery 05/29/2013  . Hypertensive heart disease without CHF 05/29/2013    Past Surgical History:  Procedure Laterality Date  . CARDIAC CATHETERIZATION  1998   40% LM,  95% Ramus interm  . CATARACT EXTRACTION, BILATERAL    . COLONOSCOPY WITH PROPOFOL N/A 08/12/2018   Procedure: COLONOSCOPY WITH PROPOFOL;  Surgeon: Lucilla Lame, MD;  Location: [place] [facility];  Service: Endoscopy;  Laterality: N/A;  . ENDARTERECTOMY FEMORAL Left 10/12/2018   Procedure: ENDARTERECTOMY FEMORAL;  Surgeon: Katha Cabal, MD;  Location: [facility] ORS;  Service: Vascular;  Laterality: Left;  angioplasty and left SFA stent  placement  . EYE SURGERY Bilateral    cataract extractions  . LOWER EXTREMITY ANGIOGRAPHY Left 08/23/2017   Procedure: Lower Extremity Angiography;  Surgeon: Algernon Huxley, MD;  Location: [place] [facility];  Service: Cardiovascular;  Laterality: Left;  . LOWER EXTREMITY ANGIOGRAPHY Left 07/26/2018   Procedure: LOWER EXTREMITY ANGIOGRAPHY;  Surgeon: Katha Cabal, MD;  Location: [place] [facility];  Service: Cardiovascular;  Laterality: Left;  . LOWER EXTREMITY ANGIOGRAPHY Left 09/16/2018   Procedure: LOWER EXTREMITY ANGIOGRAPHY;  Surgeon: Katha Cabal, MD;  Location: [place] [facility];  Service: Cardiovascular;  Laterality: Left;  . PTCA  08/2013   Left common iliac  . PTCA  12/2012   left ext iliac  . TUBAL LIGATION      OB History   No obstetric history on file.      Home Medications    Prior to Admission medications   Medication Sig Start Date End Date Taking? Authorizing Provider  chlorthalidone (HYGROTON) 25 MG tablet Take by mouth. 01/03/20  Yes [provider]  clopidogrel (PLAVIX) 75 MG tablet Take 75 mg by mouth daily.   Yes [provider]  Cyanocobalamin (RA VITAMIN B-12 TR) 1000 MCG TBCR Take 1,000 mcg by mouth daily.    Yes [provider]  diltiazem (CARTIA XT) 180 MG 24 hr capsule Take 180 mg by mouth 2 (two) times daily.    Yes [provider]  ferrous sulfate 325 (65 FE) MG tablet Take 325 mg by mouth daily with breakfast.   Yes [provider]  gabapentin (NEURONTIN) 100 MG capsule TAKE 1 TO 2 CAPSULES(100 TO 200 MG) BY MOUTH AT BEDTIME Patient taking differently: Take 100-200 mg by mouth at bedtime.  08/30/19  Yes [name], MD  insulin aspart (NOVOLOG FLEXPEN) 100 UNIT/ML FlexPen Inject 3 Units into the skin 3 (three) times daily with meals. At lunch Patient taking differently: Inject 2 Units into the skin daily. At breakfast 01/18/19  Yes [name], MD  Insulin Glargine (BASAGLAR  KWIKPEN) 100 UNIT/ML SOPN Inject 0.12 mLs (12 Units total) into the skin daily. 06/27/19  Yes [name], MD  losartan (COZAAR) 50 MG tablet Take by mouth. 01/03/20  Yes [provider]  metFORMIN (GLUCOPHAGE-XR) 500 MG 24 hr tablet TAKE 1 TABLET BY MOUTH TWICE DAILY 03/25/19  Yes [name], MD  Multiple Vitamins-Minerals (PRESERVISION AREDS PO) Take 1 capsule by mouth 2 (two) times daily.    Yes [provider]  pantoprazole (PROTONIX) 40 MG tablet Take 1 tablet (40 mg total) by mouth daily. 12/29/19  Yes Wieting, Richard, MD  rosuvastatin (CRESTOR) 5 MG tablet Take 5 mg by mouth every other day.  03/02/18  Yes [provider]  acetaminophen (TYLENOL) 650 MG CR tablet Take 1,300 mg by mouth every 8 (eight) hours as needed for pain.    [provider]  B-D ULTRAFINE III SHORT PEN 31G X 8 MM MISC USE AS DIRECTED 12/19/18   [name], MD  baclofen (LIORESAL) 10 MG tablet TAKE 1  TABLET(10 MG) BY MOUTH THREE TIMES DAILY Patient taking differently: Take 10 mg by mouth at bedtime.  02/09/18   [name], MD  feeding supplement, GLUCERNA SHAKE, (GLUCERNA SHAKE) LIQD Take 237 mLs by mouth 3 (three) times daily between meals. 12/28/19   Loletha Grayer, MD  nitroGLYCERIN (NITROSTAT) 0.4 MG SL tablet 1 tab under the tongue for CP, may repeat in 5 minutes up to 3 doses 09/07/18   [name], MD    Family History Family History  Problem Relation Age of Onset  . Dementia Mother   . Diabetes Father     Social History Social History   Tobacco Use  . Smoking status: Former Smoker    Packs/day: 2.00    Years: 37.00    Pack years: 74.00    Types: Cigarettes    Quit date: 1980    Years since quitting: 41.2  . Smokeless tobacco: Never Used  . Tobacco comment: smoking cessation materials not required  Substance Use Topics  . Alcohol use: No    Alcohol/week: 0.0 standard drinks  . Drug use: No     Allergies   Saxagliptin, Epinephrine,  Atorvastatin, Codeine, Ezetimibe, Limonene, Nitrofurantoin, and Sulfa antibiotics   Review of Systems Review of Systems   Physical Exam Triage Vital Signs ED Triage Vitals  Enc Vitals Group     BP 01/06/20 1352 (!) 176/60     Pulse Rate 01/06/20 1352 68     Resp 01/06/20 1352 14     Temp 01/06/20 1352 97.8 F (36.6 C)     Temp Source 01/06/20 1352 Oral     SpO2 01/06/20 1352 97 %     Weight 01/06/20 1346 118 lb (53.5 kg)     Height 01/06/20 1346 5\' 1"  (1.549 m)     Head Circumference --      Peak Flow --      Pain Score 01/06/20 1346 5     Pain Loc --      Pain Edu? --      Excl. in [place]? --    No data found.  Updated Vital Signs BP (!) 176/60 (BP Location: Left Arm)   Pulse 68   Temp 97.8 F (36.6 C) (Oral)   Resp 14   Ht 5\' 1"  (1.549 m)   Wt 53.5 kg   SpO2 97%   BMI 22.30 kg/m   Visual Acuity Right Eye Distance:   Left Eye Distance:   Bilateral Distance:    Right Eye Near:   Left Eye Near:    Bilateral Near:     Physical Exam Vitals and nursing note reviewed.  Constitutional:      General: She is not in acute distress.    Appearance: She is not toxic-appearing or diaphoretic.  HENT:     Head: Contusion (3x2cm tender hematoma noted on left scalp) present. No raccoon eyes or Battle's sign.      Right Ear: Tympanic membrane, ear canal and external ear normal.     Left Ear: Tympanic membrane, ear canal and external ear normal.     Nose: Nose normal.  Eyes:     Extraocular Movements: Extraocular movements intact.     Pupils: Pupils are equal, round, and reactive to light.  Cardiovascular:     Rate and Rhythm: Normal rate.  Pulmonary:     Effort: Pulmonary effort is normal. No respiratory distress.  Musculoskeletal:     Cervical back: Normal range of motion and neck supple. No  tenderness.  Neurological:     General: No focal deficit present.     Mental Status: She is alert and oriented to person, place, and time.     Cranial Nerves: No cranial nerve  deficit.     Sensory: No sensory deficit.     Motor: No weakness.     Coordination: Coordination normal.     Gait: Gait normal.     Deep Tendon Reflexes: Reflexes normal.      UC Treatments / Results  Labs (all labs ordered are listed, but only abnormal results are displayed) Labs Reviewed - No data to display  EKG   Radiology CT Head Wo Contrast  Result Date: 01/06/2020 CLINICAL DATA:  85 year old female with headache following fall and head injury today. Initial encounter. EXAM: CT HEAD WITHOUT CONTRAST TECHNIQUE: Contiguous axial images were obtained from the base of the skull through the vertex without intravenous contrast. COMPARISON:  04/09/2017 FINDINGS: Brain: No evidence of acute infarction, hemorrhage, hydrocephalus, extra-axial collection or mass lesion/mass effect. Atrophy and chronic small-vessel white matter ischemic changes again noted. Vascular: Carotid and vertebral atherosclerotic calcifications are noted. Skull: Normal. Negative for fracture or focal lesion. Sinuses/Orbits: No acute finding. Other: High anterior LEFT scalp soft tissue swelling noted IMPRESSION: 1. No evidence of acute intracranial abnormality. 2. High anterior LEFT scalp soft tissue swelling without fracture. 3. Atrophy and chronic small-vessel white matter ischemic changes. Electronically Signed   By: Margarette Canada M.D.   On: 01/06/2020 14:38    Procedures Procedures (including critical care time)  Medications Ordered in UC Medications - No data to display  Initial Impression / Assessment and Plan / UC Course  I have reviewed the triage vital signs and the nursing notes.  Pertinent labs & imaging results that were available during my care of the patient were reviewed by me and considered in my medical decision making (see chart for details).     Final Clinical Impressions(s) / UC Diagnoses   Final diagnoses:  Injury of head, initial encounter  Contusion of scalp, initial encounter  Fall,  initial encounter    ED Prescriptions    None     1. CT head results (negative for acute intracranial abnormality)  and diagnosis reviewed with patient and daughter 2. Recommend supportive treatment otc tylenol, ice to area and monitor for any new symptoms 3. Follow-up prn if symptoms worsen or don't improve   PDMP not reviewed this encounter.   Norval Gable, MD 01/06/20 1445

## 2020-01-08 ENCOUNTER — Encounter: Payer: Self-pay | Admitting: Internal Medicine

## 2020-01-08 ENCOUNTER — Other Ambulatory Visit: Payer: Self-pay

## 2020-01-08 ENCOUNTER — Ambulatory Visit (INDEPENDENT_AMBULATORY_CARE_PROVIDER_SITE_OTHER): Payer: Medicare Other | Admitting: Internal Medicine

## 2020-01-08 VITALS — BP 136/64 | HR 76 | Temp 97.4°F | Ht 61.0 in | Wt 117.0 lb

## 2020-01-08 DIAGNOSIS — H353210 Exudative age-related macular degeneration, right eye, stage unspecified: Secondary | ICD-10-CM | POA: Diagnosis not present

## 2020-01-08 DIAGNOSIS — I119 Hypertensive heart disease without heart failure: Secondary | ICD-10-CM | POA: Diagnosis not present

## 2020-01-08 DIAGNOSIS — E441 Mild protein-calorie malnutrition: Secondary | ICD-10-CM | POA: Diagnosis not present

## 2020-01-08 DIAGNOSIS — S0003XA Contusion of scalp, initial encounter: Secondary | ICD-10-CM | POA: Diagnosis not present

## 2020-01-08 DIAGNOSIS — L609 Nail disorder, unspecified: Secondary | ICD-10-CM

## 2020-01-08 DIAGNOSIS — I739 Peripheral vascular disease, unspecified: Secondary | ICD-10-CM | POA: Diagnosis not present

## 2020-01-08 DIAGNOSIS — E113553 Type 2 diabetes mellitus with stable proliferative diabetic retinopathy, bilateral: Secondary | ICD-10-CM | POA: Diagnosis not present

## 2020-01-08 DIAGNOSIS — Z8719 Personal history of other diseases of the digestive system: Secondary | ICD-10-CM | POA: Diagnosis not present

## 2020-01-08 DIAGNOSIS — I10 Essential (primary) hypertension: Secondary | ICD-10-CM | POA: Diagnosis not present

## 2020-01-08 DIAGNOSIS — K922 Gastrointestinal hemorrhage, unspecified: Secondary | ICD-10-CM | POA: Diagnosis not present

## 2020-01-08 DIAGNOSIS — A049 Bacterial intestinal infection, unspecified: Secondary | ICD-10-CM | POA: Diagnosis not present

## 2020-01-08 DIAGNOSIS — I2510 Atherosclerotic heart disease of native coronary artery without angina pectoris: Secondary | ICD-10-CM | POA: Diagnosis not present

## 2020-01-08 DIAGNOSIS — R009 Unspecified abnormalities of heart beat: Secondary | ICD-10-CM | POA: Diagnosis not present

## 2020-01-08 DIAGNOSIS — K591 Functional diarrhea: Secondary | ICD-10-CM | POA: Diagnosis not present

## 2020-01-08 LAB — CBC AND DIFFERENTIAL
HCT: 29 — AB (ref 36–46)
Hemoglobin: 9.6 — AB (ref 12.0–16.0)
Platelets: 303 (ref 150–399)
WBC: 7.8

## 2020-01-08 NOTE — Patient Instructions (Signed)
Consider wearing compression socks on both legs  Schedule Mammogram at [facility] consult with Dr. Vickki Muff

## 2020-01-08 NOTE — Progress Notes (Signed)
Date:  01/08/2020   Name:  Jill Hudson   DOB:  1935/10/17   MRN:  [id number]   Chief Complaint: Hospitalization Follow-up (Follow up from rectal bleeding.) and Diabetes (Foot Exam.) Hospital follow up from GI bleed.  Also fell Saturday and sustained a scalp hematoma. Admitted to [facility] 12/25/19 - 12/28/19 for GI bleed.  Received TOC call on 12/29/19.  HOSPITAL COURSE:    Acute blood loss anemia, diverticular bleed.  The patient was given 1 unit of packed red blood cells during the hospital course.  Patient was given IV iron on 12/27/2019.  The patient's aspirin and Plavix were held.  Hemoglobin relatively stable over the past 3 days.  Case discussed with gastroenterology and okay for discharge home.  Follow-up as outpatient and recheck hemoglobin.  Continue to be off aspirin and Plavix for at least 10 to 14 days.  Can consider going back on 1 agent after that.  Anemia Presents for follow-up visit. Symptoms include bruises/bleeds easily and weight loss. There has been no abdominal pain, anorexia, confusion, fever, light-headedness or palpitations. (Acute gi blood loss from divertiulosis)  There are no compliance problems.   She has had no further bleeding.  She continues on daily iron.  CBC drawn this AM at GI shows improvement in Hct.  She has been told to resume the Plavix but will continue to hold the aspirin. She is gaining some strength.  She had lost about 10 lbs - is trying to improve her nutrition with Glucerna shakes.  DM - she continues to see the Endocrinologist.  She denies foot issues but does have long nails that have not been treated by Podiatry in some time.  Hematoma - she fell from bed 2 days ago - hit her left hip on the step up to the bed and her head on the bedside table.  There was no LOC and no bleeding.  She went to the ED and was able to be discharged home. Lab Results  Component Value Date   WBC 7.8 01/08/2020   HGB 9.6 (A) 01/08/2020   HCT 29 (A) 01/08/2020   MCV 90.6  12/27/2019   PLT 303 01/08/2020    Lab Results  Component Value Date   CREATININE 1.09 (H) 12/27/2019   BUN 31 (H) 12/27/2019   NA 140 12/27/2019   K 3.7 12/27/2019   CL 112 (H) 12/27/2019   CO2 19 (L) 12/27/2019   Lab Results  Component Value Date   CHOL 139 12/26/2018   HDL 47 12/26/2018   LDLCALC 57 12/26/2018   TRIG 176 (H) 12/26/2018   CHOLHDL 3.0 12/26/2018   Lab Results  Component Value Date   TSH 3.512 12/25/2019   Lab Results  Component Value Date   HGBA1C 6.5 10/16/2019     Review of Systems  Constitutional: Positive for weight loss. Negative for chills and fever.  HENT: Negative for trouble swallowing.   Respiratory: Negative for cough and chest tightness.   Cardiovascular: Positive for leg swelling. Negative for chest pain and palpitations.  Gastrointestinal: Negative for abdominal pain, anorexia, blood in stool, constipation and diarrhea.  Skin: Negative for rash.  Neurological: Negative for dizziness and light-headedness.  Hematological: Bruises/bleeds easily.  Psychiatric/Behavioral: Negative for confusion.    Patient Active Problem List   Diagnosis Date Noted  . Acute blood loss anemia   . Diverticulosis of large intestine with hemorrhage   . Acute kidney injury superimposed on CKD ([place])   . GI bleeding  12/25/2019  . Rectal bleeding   . Age-related macular degeneration, dry, left eye 06/21/2019  . Age-related macular degeneration, wet, right eye ([place]) 06/21/2019  . Moderate nonproliferative diabetic retinopathy associated with type 2 diabetes mellitus ([place]) 06/21/2019  . Lumbosacral radiculopathy at L4 05/01/2019  . Underweight 05/01/2019  . Encounter for long-term (current) use of aspirin 10/31/2018  . Encounter for long-term (current) use of antiplatelets/antithrombotics 10/31/2018  . Long term current use of oral hypoglycemic drug 10/31/2018  . Encounter for long-term (current) use of insulin ([place]) 10/31/2018  . GIB (gastrointestinal  bleeding) 08/11/2018  . Leg pain 07/03/2017  . Carpal tunnel syndrome on both sides 05/05/2017  . Myalgia due to HMG CoA reductase inhibitor 05/05/2017  . History of CVA (cerebrovascular accident) 02/15/2017  . Degenerative disc disease, lumbar 12/21/2016  . Atherosclerosis of native arteries of extremity with intermittent claudication ([place]) 12/20/2016  . Bilateral carotid artery stenosis 12/20/2016  . Hip bursitis 05/18/2016  . Elevated TSH 01/18/2016  . CKD stage 3 due to type 2 diabetes mellitus ([place]) 01/16/2016  . Senile ecchymosis 01/16/2016  . Type II diabetes mellitus with complication ([place]) 16/07/9603  . GERD (gastroesophageal reflux disease) 07/24/2015  . PVD (peripheral vascular disease) ([place]) 05/17/2015  . Neoplasm of uncertain behavior of skin 05/17/2015  . Retinopathy, diabetic, proliferative ([place]) 04/11/2015  . Hyperlipidemia 04/11/2015  . Essential hypertension 04/11/2015  . Generalized OA 04/11/2015  . Arteriosclerosis of coronary artery 05/29/2013  . Hypertensive heart disease without CHF 05/29/2013    Allergies  Allergen Reactions  . Saxagliptin Diarrhea  . Epinephrine Other (See Comments)    Patient does not remember what happens when she uses this  . Atorvastatin Other (See Comments)    Muscle aches  . Codeine Other (See Comments)    Upset stomach  . Ezetimibe Other (See Comments)    Myalgias(ZETIA)  . Limonene Rash    Patient does not recall this reaction  . Nitrofurantoin Rash and Other (See Comments)    Pruitus  . Sulfa Antibiotics Rash and Other (See Comments)    Sore mouth     Past Surgical History:  Procedure Laterality Date  . CARDIAC CATHETERIZATION  1998   40% LM, 95% Ramus interm  . CATARACT EXTRACTION, BILATERAL    . COLONOSCOPY WITH PROPOFOL N/A 08/12/2018   Procedure: COLONOSCOPY WITH PROPOFOL;  Surgeon: Lucilla Lame, MD;  Location: [facility];  Service: Endoscopy;  Laterality: N/A;  . ENDARTERECTOMY FEMORAL Left 10/12/2018    Procedure: ENDARTERECTOMY FEMORAL;  Surgeon: Katha Cabal, MD;  Location: [facility] ORS;  Service: Vascular;  Laterality: Left;  angioplasty and left SFA stent placement  . EYE SURGERY Bilateral    cataract extractions  . LOWER EXTREMITY ANGIOGRAPHY Left 08/23/2017   Procedure: Lower Extremity Angiography;  Surgeon: Algernon Huxley, MD;  Location: [place] [facility];  Service: Cardiovascular;  Laterality: Left;  . LOWER EXTREMITY ANGIOGRAPHY Left 07/26/2018   Procedure: LOWER EXTREMITY ANGIOGRAPHY;  Surgeon: Katha Cabal, MD;  Location: [place] [facility];  Service: Cardiovascular;  Laterality: Left;  . LOWER EXTREMITY ANGIOGRAPHY Left 09/16/2018   Procedure: LOWER EXTREMITY ANGIOGRAPHY;  Surgeon: Katha Cabal, MD;  Location: [place] [facility];  Service: Cardiovascular;  Laterality: Left;  . PTCA  08/2013   Left common iliac  . PTCA  12/2012   left ext iliac  . TUBAL LIGATION      Social History   Tobacco Use  . Smoking status: Former Smoker    Packs/day: 2.00  Years: 37.00    Pack years: 74.00    Types: Cigarettes    Quit date: 1980    Years since quitting: 41.2  . Smokeless tobacco: Never Used  . Tobacco comment: smoking cessation materials not required  Substance Use Topics  . Alcohol use: No    Alcohol/week: 0.0 standard drinks  . Drug use: No     Medication list has been reviewed and updated.  Current Meds  Medication Sig  . acetaminophen (TYLENOL) 650 MG CR tablet Take 1,300 mg by mouth every 8 (eight) hours as needed for pain.  . B-D ULTRAFINE III SHORT PEN 31G X 8 MM MISC USE AS DIRECTED  . baclofen (LIORESAL) 10 MG tablet TAKE 1 TABLET(10 MG) BY MOUTH THREE TIMES DAILY (Patient taking differently: Take 10 mg by mouth at bedtime. )  . chlorthalidone (HYGROTON) 25 MG tablet Take by mouth.  . clopidogrel (PLAVIX) 75 MG tablet Take 75 mg by mouth daily.  . Cyanocobalamin (RA VITAMIN B-12 TR) 1000 MCG TBCR Take 1,000 mcg by mouth daily.   Marland Kitchen diltiazem  (CARTIA XT) 180 MG 24 hr capsule Take 180 mg by mouth 2 (two) times daily.   . feeding supplement, GLUCERNA SHAKE, (GLUCERNA SHAKE) LIQD Take 237 mLs by mouth 3 (three) times daily between meals.  . ferrous sulfate 325 (65 FE) MG tablet Take 325 mg by mouth daily with breakfast.  . gabapentin (NEURONTIN) 100 MG capsule TAKE 1 TO 2 CAPSULES(100 TO 200 MG) BY MOUTH AT BEDTIME (Patient taking differently: Take 100-200 mg by mouth at bedtime. )  . insulin aspart (NOVOLOG FLEXPEN) 100 UNIT/ML FlexPen Inject 3 Units into the skin 3 (three) times daily with meals. At lunch (Patient taking differently: Inject 2 Units into the skin daily. At breakfast)  . Insulin Glargine (BASAGLAR KWIKPEN) 100 UNIT/ML SOPN Inject 0.12 mLs (12 Units total) into the skin daily.  Marland Kitchen losartan (COZAAR) 50 MG tablet Take 50 mg by mouth daily.   . metFORMIN (GLUCOPHAGE-XR) 500 MG 24 hr tablet TAKE 1 TABLET BY MOUTH TWICE DAILY  . Multiple Vitamins-Minerals (PRESERVISION AREDS PO) Take 1 capsule by mouth 2 (two) times daily.   . nitroGLYCERIN (NITROSTAT) 0.4 MG SL tablet 1 tab under the tongue for CP, may repeat in 5 minutes up to 3 doses  . pantoprazole (PROTONIX) 40 MG tablet Take 1 tablet (40 mg total) by mouth daily.  . rosuvastatin (CRESTOR) 5 MG tablet Take 5 mg by mouth every other day.     PHQ 2/9 Scores 01/08/2020 01/01/2020 10/02/2019 09/14/2019  PHQ - 2 Score 0 2 2 4   PHQ- 9 Score 0 3 3 5     BP Readings from Last 3 Encounters:  01/08/20 136/64  01/06/20 (!) 176/60  12/28/19 133/61    Physical Exam Vitals and nursing note reviewed.  Constitutional:      General: She is not in acute distress.    Appearance: She is well-developed.  HENT:     Head: Normocephalic. No raccoon eyes or abrasion.      Comments: 2 cm hematoma without bleeding or tenderness Cardiovascular:     Rate and Rhythm: Normal rate and regular rhythm.     Heart sounds: No murmur.  Pulmonary:     Effort: Pulmonary effort is normal. No  respiratory distress.  Abdominal:     General: Abdomen is flat.     Palpations: Abdomen is soft.  Musculoskeletal:     Right lower leg: Edema present.     Left  lower leg: Edema present.  Skin:    General: Skin is warm and dry.     Findings: Ecchymosis present. No rash.          Comments: Bruising on lateral left thigh Nails are thick on multiple toes  Neurological:     Mental Status: She is alert and oriented to person, place, and time.  Psychiatric:        Attention and Perception: Attention normal.        Mood and Affect: Mood normal.     Wt Readings from Last 3 Encounters:  01/08/20 117 lb (53.1 kg)  01/06/20 118 lb (53.5 kg)  12/25/19 126 lb 1.7 oz (57.2 kg)    BP 136/64   Pulse 76   Temp (!) 97.4 F (36.3 C) (Oral)   Ht 5\' 1"  (1.549 m)   Wt 117 lb (53.1 kg)   SpO2 96%   BMI 22.11 kg/m   Assessment and Plan: 1. History of GI diverticular bleed Bleeding appears to have stopped CBC is improved Continue Iron oral Follow up with GI as planned  2. Nail disorder, unspecified Needs podiatry nail care - Ambulatory referral to Podiatry  3. Malnutrition of mild degree (HCC) Continue Glucerna shakes in between meals Mild LE edema may be a consequence Recommend compression socks on both legs  4. Hematoma of scalp, initial encounter Stable - should resolve with issues Same for hematoma on left thigh  5. Exudative age-related macular degeneration of right eye, unspecified stage ([place]) Continue close Ophthalmology follow up  6. Stable proliferative diabetic retinopathy of both eyes associated with type 2 diabetes mellitus ([place]) Continue close Ophthalmology follow up   Partially dictated using Laurie. Any errors are unintentional.  [name], MD [facility]  01/08/2020

## 2020-01-09 DIAGNOSIS — N1831 Chronic kidney disease, stage 3a: Secondary | ICD-10-CM | POA: Diagnosis not present

## 2020-01-09 DIAGNOSIS — K922 Gastrointestinal hemorrhage, unspecified: Secondary | ICD-10-CM | POA: Diagnosis not present

## 2020-01-09 DIAGNOSIS — K5731 Diverticulosis of large intestine without perforation or abscess with bleeding: Secondary | ICD-10-CM | POA: Diagnosis not present

## 2020-01-09 DIAGNOSIS — N179 Acute kidney failure, unspecified: Secondary | ICD-10-CM | POA: Diagnosis not present

## 2020-01-09 DIAGNOSIS — K625 Hemorrhage of anus and rectum: Secondary | ICD-10-CM | POA: Diagnosis not present

## 2020-01-09 DIAGNOSIS — A049 Bacterial intestinal infection, unspecified: Secondary | ICD-10-CM | POA: Diagnosis not present

## 2020-01-09 DIAGNOSIS — E1122 Type 2 diabetes mellitus with diabetic chronic kidney disease: Secondary | ICD-10-CM | POA: Diagnosis not present

## 2020-01-09 DIAGNOSIS — K591 Functional diarrhea: Secondary | ICD-10-CM | POA: Diagnosis not present

## 2020-01-09 DIAGNOSIS — E1142 Type 2 diabetes mellitus with diabetic polyneuropathy: Secondary | ICD-10-CM | POA: Diagnosis not present

## 2020-01-10 DIAGNOSIS — K922 Gastrointestinal hemorrhage, unspecified: Secondary | ICD-10-CM | POA: Diagnosis not present

## 2020-01-10 DIAGNOSIS — K591 Functional diarrhea: Secondary | ICD-10-CM | POA: Diagnosis not present

## 2020-01-11 ENCOUNTER — Other Ambulatory Visit: Payer: Self-pay | Admitting: Internal Medicine

## 2020-01-11 DIAGNOSIS — E1122 Type 2 diabetes mellitus with diabetic chronic kidney disease: Secondary | ICD-10-CM

## 2020-01-11 DIAGNOSIS — I119 Hypertensive heart disease without heart failure: Secondary | ICD-10-CM

## 2020-01-11 DIAGNOSIS — E118 Type 2 diabetes mellitus with unspecified complications: Secondary | ICD-10-CM

## 2020-01-11 DIAGNOSIS — Z7982 Long term (current) use of aspirin: Secondary | ICD-10-CM

## 2020-01-11 DIAGNOSIS — M159 Polyosteoarthritis, unspecified: Secondary | ICD-10-CM

## 2020-01-11 DIAGNOSIS — R636 Underweight: Secondary | ICD-10-CM

## 2020-01-11 DIAGNOSIS — E1142 Type 2 diabetes mellitus with diabetic polyneuropathy: Secondary | ICD-10-CM | POA: Diagnosis not present

## 2020-01-11 DIAGNOSIS — N179 Acute kidney failure, unspecified: Secondary | ICD-10-CM | POA: Diagnosis not present

## 2020-01-11 DIAGNOSIS — Z9181 History of falling: Secondary | ICD-10-CM

## 2020-01-11 DIAGNOSIS — Z7902 Long term (current) use of antithrombotics/antiplatelets: Secondary | ICD-10-CM

## 2020-01-11 DIAGNOSIS — E782 Mixed hyperlipidemia: Secondary | ICD-10-CM

## 2020-01-11 DIAGNOSIS — N1831 Chronic kidney disease, stage 3a: Secondary | ICD-10-CM | POA: Diagnosis not present

## 2020-01-11 DIAGNOSIS — H353210 Exudative age-related macular degeneration, right eye, stage unspecified: Secondary | ICD-10-CM

## 2020-01-11 DIAGNOSIS — K219 Gastro-esophageal reflux disease without esophagitis: Secondary | ICD-10-CM

## 2020-01-11 DIAGNOSIS — I6523 Occlusion and stenosis of bilateral carotid arteries: Secondary | ICD-10-CM

## 2020-01-11 DIAGNOSIS — I739 Peripheral vascular disease, unspecified: Secondary | ICD-10-CM

## 2020-01-11 DIAGNOSIS — N183 Chronic kidney disease, stage 3 unspecified: Secondary | ICD-10-CM

## 2020-01-11 DIAGNOSIS — E113593 Type 2 diabetes mellitus with proliferative diabetic retinopathy without macular edema, bilateral: Secondary | ICD-10-CM

## 2020-01-11 DIAGNOSIS — I2510 Atherosclerotic heart disease of native coronary artery without angina pectoris: Secondary | ICD-10-CM

## 2020-01-11 DIAGNOSIS — K625 Hemorrhage of anus and rectum: Secondary | ICD-10-CM | POA: Diagnosis not present

## 2020-01-11 DIAGNOSIS — K5731 Diverticulosis of large intestine without perforation or abscess with bleeding: Secondary | ICD-10-CM | POA: Diagnosis not present

## 2020-01-11 DIAGNOSIS — Z8673 Personal history of transient ischemic attack (TIA), and cerebral infarction without residual deficits: Secondary | ICD-10-CM

## 2020-01-11 DIAGNOSIS — D62 Acute posthemorrhagic anemia: Secondary | ICD-10-CM

## 2020-01-11 DIAGNOSIS — I70212 Atherosclerosis of native arteries of extremities with intermittent claudication, left leg: Secondary | ICD-10-CM

## 2020-01-11 DIAGNOSIS — Z794 Long term (current) use of insulin: Secondary | ICD-10-CM

## 2020-01-11 DIAGNOSIS — Z7984 Long term (current) use of oral hypoglycemic drugs: Secondary | ICD-10-CM

## 2020-01-11 DIAGNOSIS — I10 Essential (primary) hypertension: Secondary | ICD-10-CM

## 2020-01-11 NOTE — Progress Notes (Signed)
Received orders from [facility]. Start of care 12/29/19.   Orders from 12/29/19 through 02/26/20 are reviewed, signed and faxed.

## 2020-01-16 DIAGNOSIS — E1122 Type 2 diabetes mellitus with diabetic chronic kidney disease: Secondary | ICD-10-CM | POA: Diagnosis not present

## 2020-01-16 DIAGNOSIS — E113213 Type 2 diabetes mellitus with mild nonproliferative diabetic retinopathy with macular edema, bilateral: Secondary | ICD-10-CM | POA: Diagnosis not present

## 2020-01-16 DIAGNOSIS — N179 Acute kidney failure, unspecified: Secondary | ICD-10-CM | POA: Diagnosis not present

## 2020-01-16 DIAGNOSIS — K5731 Diverticulosis of large intestine without perforation or abscess with bleeding: Secondary | ICD-10-CM | POA: Diagnosis not present

## 2020-01-16 DIAGNOSIS — K625 Hemorrhage of anus and rectum: Secondary | ICD-10-CM | POA: Diagnosis not present

## 2020-01-16 DIAGNOSIS — E1142 Type 2 diabetes mellitus with diabetic polyneuropathy: Secondary | ICD-10-CM | POA: Diagnosis not present

## 2020-01-16 DIAGNOSIS — N1831 Chronic kidney disease, stage 3a: Secondary | ICD-10-CM | POA: Diagnosis not present

## 2020-01-18 DIAGNOSIS — K5731 Diverticulosis of large intestine without perforation or abscess with bleeding: Secondary | ICD-10-CM | POA: Diagnosis not present

## 2020-01-18 DIAGNOSIS — K625 Hemorrhage of anus and rectum: Secondary | ICD-10-CM | POA: Diagnosis not present

## 2020-01-18 DIAGNOSIS — E1122 Type 2 diabetes mellitus with diabetic chronic kidney disease: Secondary | ICD-10-CM | POA: Diagnosis not present

## 2020-01-18 DIAGNOSIS — E1142 Type 2 diabetes mellitus with diabetic polyneuropathy: Secondary | ICD-10-CM | POA: Diagnosis not present

## 2020-01-18 DIAGNOSIS — N179 Acute kidney failure, unspecified: Secondary | ICD-10-CM | POA: Diagnosis not present

## 2020-01-18 DIAGNOSIS — N1831 Chronic kidney disease, stage 3a: Secondary | ICD-10-CM | POA: Diagnosis not present

## 2020-01-22 DIAGNOSIS — B351 Tinea unguium: Secondary | ICD-10-CM | POA: Diagnosis not present

## 2020-01-22 DIAGNOSIS — Z794 Long term (current) use of insulin: Secondary | ICD-10-CM | POA: Diagnosis not present

## 2020-01-22 DIAGNOSIS — E1142 Type 2 diabetes mellitus with diabetic polyneuropathy: Secondary | ICD-10-CM | POA: Diagnosis not present

## 2020-01-25 DIAGNOSIS — N1831 Chronic kidney disease, stage 3a: Secondary | ICD-10-CM | POA: Diagnosis not present

## 2020-01-25 DIAGNOSIS — K625 Hemorrhage of anus and rectum: Secondary | ICD-10-CM | POA: Diagnosis not present

## 2020-01-25 DIAGNOSIS — E1122 Type 2 diabetes mellitus with diabetic chronic kidney disease: Secondary | ICD-10-CM | POA: Diagnosis not present

## 2020-01-25 DIAGNOSIS — E1142 Type 2 diabetes mellitus with diabetic polyneuropathy: Secondary | ICD-10-CM | POA: Diagnosis not present

## 2020-01-25 DIAGNOSIS — N179 Acute kidney failure, unspecified: Secondary | ICD-10-CM | POA: Diagnosis not present

## 2020-01-25 DIAGNOSIS — K5731 Diverticulosis of large intestine without perforation or abscess with bleeding: Secondary | ICD-10-CM | POA: Diagnosis not present

## 2020-01-26 LAB — BASIC METABOLIC PANEL
BUN: 17 (ref 4–21)
CO2: 30 — AB (ref 13–22)
Chloride: 30 — AB (ref 99–108)
Creatinine: 0.9 (ref 0.5–1.1)
Glucose: 138
Potassium: 4.4 (ref 3.4–5.3)
Sodium: 144 (ref 137–147)

## 2020-01-26 LAB — CBC: RBC: 3.1 — AB (ref 3.87–5.11)

## 2020-01-26 LAB — CBC AND DIFFERENTIAL
HCT: 29 — AB (ref 36–46)
Hemoglobin: 9.6 — AB (ref 12.0–16.0)
Platelets: 330 (ref 150–399)
WBC: 7.8

## 2020-01-26 LAB — COMPREHENSIVE METABOLIC PANEL: Calcium: 8.6 — AB (ref 8.7–10.7)

## 2020-01-28 DIAGNOSIS — D62 Acute posthemorrhagic anemia: Secondary | ICD-10-CM | POA: Diagnosis not present

## 2020-01-28 DIAGNOSIS — I70229 Atherosclerosis of native arteries of extremities with rest pain, unspecified extremity: Secondary | ICD-10-CM | POA: Diagnosis not present

## 2020-01-28 DIAGNOSIS — I10 Essential (primary) hypertension: Secondary | ICD-10-CM | POA: Diagnosis not present

## 2020-01-28 DIAGNOSIS — Z87891 Personal history of nicotine dependence: Secondary | ICD-10-CM | POA: Diagnosis not present

## 2020-01-28 DIAGNOSIS — N179 Acute kidney failure, unspecified: Secondary | ICD-10-CM | POA: Diagnosis not present

## 2020-01-28 DIAGNOSIS — E1142 Type 2 diabetes mellitus with diabetic polyneuropathy: Secondary | ICD-10-CM | POA: Diagnosis not present

## 2020-01-28 DIAGNOSIS — Z8673 Personal history of transient ischemic attack (TIA), and cerebral infarction without residual deficits: Secondary | ICD-10-CM | POA: Diagnosis not present

## 2020-01-28 DIAGNOSIS — R011 Cardiac murmur, unspecified: Secondary | ICD-10-CM | POA: Diagnosis not present

## 2020-01-28 DIAGNOSIS — M1990 Unspecified osteoarthritis, unspecified site: Secondary | ICD-10-CM | POA: Diagnosis not present

## 2020-01-28 DIAGNOSIS — Z85828 Personal history of other malignant neoplasm of skin: Secondary | ICD-10-CM | POA: Diagnosis not present

## 2020-01-28 DIAGNOSIS — I2510 Atherosclerotic heart disease of native coronary artery without angina pectoris: Secondary | ICD-10-CM | POA: Diagnosis not present

## 2020-01-28 DIAGNOSIS — F419 Anxiety disorder, unspecified: Secondary | ICD-10-CM | POA: Diagnosis not present

## 2020-01-28 DIAGNOSIS — Z9181 History of falling: Secondary | ICD-10-CM | POA: Diagnosis not present

## 2020-01-28 DIAGNOSIS — K625 Hemorrhage of anus and rectum: Secondary | ICD-10-CM | POA: Diagnosis not present

## 2020-01-28 DIAGNOSIS — K5731 Diverticulosis of large intestine without perforation or abscess with bleeding: Secondary | ICD-10-CM | POA: Diagnosis not present

## 2020-01-28 DIAGNOSIS — Z79899 Other long term (current) drug therapy: Secondary | ICD-10-CM | POA: Diagnosis not present

## 2020-01-28 DIAGNOSIS — Z87440 Personal history of urinary (tract) infections: Secondary | ICD-10-CM | POA: Diagnosis not present

## 2020-01-28 DIAGNOSIS — E1151 Type 2 diabetes mellitus with diabetic peripheral angiopathy without gangrene: Secondary | ICD-10-CM | POA: Diagnosis not present

## 2020-01-28 DIAGNOSIS — N1831 Chronic kidney disease, stage 3a: Secondary | ICD-10-CM | POA: Diagnosis not present

## 2020-01-28 DIAGNOSIS — I080 Rheumatic disorders of both mitral and aortic valves: Secondary | ICD-10-CM | POA: Diagnosis not present

## 2020-01-28 DIAGNOSIS — Z794 Long term (current) use of insulin: Secondary | ICD-10-CM | POA: Diagnosis not present

## 2020-01-28 DIAGNOSIS — R251 Tremor, unspecified: Secondary | ICD-10-CM | POA: Diagnosis not present

## 2020-01-28 DIAGNOSIS — E785 Hyperlipidemia, unspecified: Secondary | ICD-10-CM | POA: Diagnosis not present

## 2020-01-28 DIAGNOSIS — H3530 Unspecified macular degeneration: Secondary | ICD-10-CM | POA: Diagnosis not present

## 2020-01-28 DIAGNOSIS — E1122 Type 2 diabetes mellitus with diabetic chronic kidney disease: Secondary | ICD-10-CM | POA: Diagnosis not present

## 2020-01-31 ENCOUNTER — Other Ambulatory Visit: Payer: Self-pay

## 2020-02-01 DIAGNOSIS — E1122 Type 2 diabetes mellitus with diabetic chronic kidney disease: Secondary | ICD-10-CM | POA: Diagnosis not present

## 2020-02-01 DIAGNOSIS — N1831 Chronic kidney disease, stage 3a: Secondary | ICD-10-CM | POA: Diagnosis not present

## 2020-02-01 DIAGNOSIS — E1142 Type 2 diabetes mellitus with diabetic polyneuropathy: Secondary | ICD-10-CM | POA: Diagnosis not present

## 2020-02-01 DIAGNOSIS — K5731 Diverticulosis of large intestine without perforation or abscess with bleeding: Secondary | ICD-10-CM | POA: Diagnosis not present

## 2020-02-01 DIAGNOSIS — K625 Hemorrhage of anus and rectum: Secondary | ICD-10-CM | POA: Diagnosis not present

## 2020-02-01 DIAGNOSIS — N179 Acute kidney failure, unspecified: Secondary | ICD-10-CM | POA: Diagnosis not present

## 2020-02-14 ENCOUNTER — Other Ambulatory Visit (INDEPENDENT_AMBULATORY_CARE_PROVIDER_SITE_OTHER): Payer: Self-pay | Admitting: Vascular Surgery

## 2020-02-18 ENCOUNTER — Encounter: Payer: Self-pay | Admitting: Emergency Medicine

## 2020-02-18 ENCOUNTER — Emergency Department: Payer: Medicare Other

## 2020-02-18 ENCOUNTER — Other Ambulatory Visit: Payer: Self-pay

## 2020-02-18 ENCOUNTER — Inpatient Hospital Stay
Admission: EM | Admit: 2020-02-18 | Discharge: 2020-02-26 | DRG: 280 | Disposition: A | Discharge status: Home Health | Payer: Medicare Other | Attending: Internal Medicine | Admitting: Internal Medicine

## 2020-02-18 DIAGNOSIS — I252 Old myocardial infarction: Secondary | ICD-10-CM

## 2020-02-18 DIAGNOSIS — I480 Paroxysmal atrial fibrillation: Secondary | ICD-10-CM

## 2020-02-18 DIAGNOSIS — E1165 Type 2 diabetes mellitus with hyperglycemia: Secondary | ICD-10-CM | POA: Diagnosis present

## 2020-02-18 DIAGNOSIS — Z952 Presence of prosthetic heart valve: Secondary | ICD-10-CM

## 2020-02-18 DIAGNOSIS — E875 Hyperkalemia: Secondary | ICD-10-CM | POA: Diagnosis present

## 2020-02-18 DIAGNOSIS — I509 Heart failure, unspecified: Secondary | ICD-10-CM | POA: Diagnosis not present

## 2020-02-18 DIAGNOSIS — J4489 Other specified chronic obstructive pulmonary disease: Secondary | ICD-10-CM

## 2020-02-18 DIAGNOSIS — E1121 Type 2 diabetes mellitus with diabetic nephropathy: Secondary | ICD-10-CM | POA: Diagnosis not present

## 2020-02-18 DIAGNOSIS — N184 Chronic kidney disease, stage 4 (severe): Secondary | ICD-10-CM | POA: Diagnosis not present

## 2020-02-18 DIAGNOSIS — I4891 Unspecified atrial fibrillation: Secondary | ICD-10-CM

## 2020-02-18 DIAGNOSIS — N1831 Chronic kidney disease, stage 3a: Secondary | ICD-10-CM

## 2020-02-18 DIAGNOSIS — R739 Hyperglycemia, unspecified: Secondary | ICD-10-CM

## 2020-02-18 DIAGNOSIS — I739 Peripheral vascular disease, unspecified: Secondary | ICD-10-CM | POA: Diagnosis not present

## 2020-02-18 DIAGNOSIS — J189 Pneumonia, unspecified organism: Secondary | ICD-10-CM | POA: Diagnosis present

## 2020-02-18 DIAGNOSIS — D649 Anemia, unspecified: Secondary | ICD-10-CM | POA: Diagnosis not present

## 2020-02-18 DIAGNOSIS — J449 Chronic obstructive pulmonary disease, unspecified: Secondary | ICD-10-CM

## 2020-02-18 DIAGNOSIS — N183 Chronic kidney disease, stage 3 unspecified: Secondary | ICD-10-CM | POA: Diagnosis present

## 2020-02-18 DIAGNOSIS — B370 Candidal stomatitis: Secondary | ICD-10-CM | POA: Diagnosis not present

## 2020-02-18 DIAGNOSIS — S0003XA Contusion of scalp, initial encounter: Secondary | ICD-10-CM | POA: Diagnosis present

## 2020-02-18 DIAGNOSIS — I110 Hypertensive heart disease with heart failure: Secondary | ICD-10-CM | POA: Diagnosis not present

## 2020-02-18 DIAGNOSIS — N179 Acute kidney failure, unspecified: Secondary | ICD-10-CM | POA: Diagnosis present

## 2020-02-18 DIAGNOSIS — Z8673 Personal history of transient ischemic attack (TIA), and cerebral infarction without residual deficits: Secondary | ICD-10-CM | POA: Diagnosis not present

## 2020-02-18 DIAGNOSIS — Z79899 Other long term (current) drug therapy: Secondary | ICD-10-CM

## 2020-02-18 DIAGNOSIS — Z833 Family history of diabetes mellitus: Secondary | ICD-10-CM

## 2020-02-18 DIAGNOSIS — I5041 Acute combined systolic (congestive) and diastolic (congestive) heart failure: Secondary | ICD-10-CM

## 2020-02-18 DIAGNOSIS — I5031 Acute diastolic (congestive) heart failure: Secondary | ICD-10-CM | POA: Diagnosis not present

## 2020-02-18 DIAGNOSIS — J440 Chronic obstructive pulmonary disease with acute lower respiratory infection: Secondary | ICD-10-CM | POA: Diagnosis present

## 2020-02-18 DIAGNOSIS — E871 Hypo-osmolality and hyponatremia: Secondary | ICD-10-CM | POA: Diagnosis present

## 2020-02-18 DIAGNOSIS — E1122 Type 2 diabetes mellitus with diabetic chronic kidney disease: Secondary | ICD-10-CM | POA: Diagnosis present

## 2020-02-18 DIAGNOSIS — I5033 Acute on chronic diastolic (congestive) heart failure: Secondary | ICD-10-CM | POA: Diagnosis present

## 2020-02-18 DIAGNOSIS — I2510 Atherosclerotic heart disease of native coronary artery without angina pectoris: Secondary | ICD-10-CM | POA: Diagnosis present

## 2020-02-18 DIAGNOSIS — E1129 Type 2 diabetes mellitus with other diabetic kidney complication: Secondary | ICD-10-CM | POA: Diagnosis present

## 2020-02-18 DIAGNOSIS — N1832 Chronic kidney disease, stage 3b: Secondary | ICD-10-CM | POA: Diagnosis not present

## 2020-02-18 DIAGNOSIS — I130 Hypertensive heart and chronic kidney disease with heart failure and stage 1 through stage 4 chronic kidney disease, or unspecified chronic kidney disease: Principal | ICD-10-CM | POA: Diagnosis present

## 2020-02-18 DIAGNOSIS — Z20822 Contact with and (suspected) exposure to covid-19: Secondary | ICD-10-CM | POA: Diagnosis present

## 2020-02-18 DIAGNOSIS — R918 Other nonspecific abnormal finding of lung field: Secondary | ICD-10-CM | POA: Diagnosis not present

## 2020-02-18 DIAGNOSIS — N189 Chronic kidney disease, unspecified: Secondary | ICD-10-CM

## 2020-02-18 DIAGNOSIS — I272 Pulmonary hypertension, unspecified: Secondary | ICD-10-CM | POA: Diagnosis present

## 2020-02-18 DIAGNOSIS — M79604 Pain in right leg: Secondary | ICD-10-CM | POA: Diagnosis not present

## 2020-02-18 DIAGNOSIS — E861 Hypovolemia: Secondary | ICD-10-CM | POA: Diagnosis present

## 2020-02-18 DIAGNOSIS — R778 Other specified abnormalities of plasma proteins: Secondary | ICD-10-CM | POA: Diagnosis not present

## 2020-02-18 DIAGNOSIS — I10 Essential (primary) hypertension: Secondary | ICD-10-CM | POA: Diagnosis present

## 2020-02-18 DIAGNOSIS — Z794 Long term (current) use of insulin: Secondary | ICD-10-CM

## 2020-02-18 DIAGNOSIS — R0600 Dyspnea, unspecified: Secondary | ICD-10-CM

## 2020-02-18 DIAGNOSIS — W1830XA Fall on same level, unspecified, initial encounter: Secondary | ICD-10-CM | POA: Diagnosis present

## 2020-02-18 DIAGNOSIS — I214 Non-ST elevation (NSTEMI) myocardial infarction: Secondary | ICD-10-CM

## 2020-02-18 DIAGNOSIS — J441 Chronic obstructive pulmonary disease with (acute) exacerbation: Secondary | ICD-10-CM | POA: Diagnosis present

## 2020-02-18 DIAGNOSIS — E872 Acidosis: Secondary | ICD-10-CM | POA: Diagnosis present

## 2020-02-18 DIAGNOSIS — K219 Gastro-esophageal reflux disease without esophagitis: Secondary | ICD-10-CM | POA: Diagnosis not present

## 2020-02-18 DIAGNOSIS — J9601 Acute respiratory failure with hypoxia: Secondary | ICD-10-CM | POA: Diagnosis not present

## 2020-02-18 DIAGNOSIS — J80 Acute respiratory distress syndrome: Secondary | ICD-10-CM | POA: Diagnosis not present

## 2020-02-18 DIAGNOSIS — E1151 Type 2 diabetes mellitus with diabetic peripheral angiopathy without gangrene: Secondary | ICD-10-CM | POA: Diagnosis present

## 2020-02-18 DIAGNOSIS — R0689 Other abnormalities of breathing: Secondary | ICD-10-CM | POA: Diagnosis not present

## 2020-02-18 DIAGNOSIS — R7989 Other specified abnormal findings of blood chemistry: Secondary | ICD-10-CM | POA: Diagnosis present

## 2020-02-18 DIAGNOSIS — E669 Obesity, unspecified: Secondary | ICD-10-CM | POA: Diagnosis present

## 2020-02-18 DIAGNOSIS — I129 Hypertensive chronic kidney disease with stage 1 through stage 4 chronic kidney disease, or unspecified chronic kidney disease: Secondary | ICD-10-CM | POA: Diagnosis not present

## 2020-02-18 DIAGNOSIS — Z87891 Personal history of nicotine dependence: Secondary | ICD-10-CM

## 2020-02-18 DIAGNOSIS — R600 Localized edema: Secondary | ICD-10-CM | POA: Diagnosis not present

## 2020-02-18 DIAGNOSIS — R069 Unspecified abnormalities of breathing: Secondary | ICD-10-CM | POA: Diagnosis not present

## 2020-02-18 DIAGNOSIS — R0902 Hypoxemia: Secondary | ICD-10-CM | POA: Diagnosis not present

## 2020-02-18 DIAGNOSIS — Z7902 Long term (current) use of antithrombotics/antiplatelets: Secondary | ICD-10-CM

## 2020-02-18 DIAGNOSIS — E785 Hyperlipidemia, unspecified: Secondary | ICD-10-CM

## 2020-02-18 DIAGNOSIS — D631 Anemia in chronic kidney disease: Secondary | ICD-10-CM | POA: Diagnosis not present

## 2020-02-18 DIAGNOSIS — R0602 Shortness of breath: Secondary | ICD-10-CM | POA: Diagnosis not present

## 2020-02-18 DIAGNOSIS — M62838 Other muscle spasm: Secondary | ICD-10-CM

## 2020-02-18 DIAGNOSIS — E118 Type 2 diabetes mellitus with unspecified complications: Secondary | ICD-10-CM | POA: Diagnosis present

## 2020-02-18 DIAGNOSIS — I959 Hypotension, unspecified: Secondary | ICD-10-CM | POA: Diagnosis present

## 2020-02-18 DIAGNOSIS — D509 Iron deficiency anemia, unspecified: Secondary | ICD-10-CM | POA: Diagnosis present

## 2020-02-18 HISTORY — DX: Heart failure, unspecified: I50.9

## 2020-02-18 LAB — BLOOD GAS, VENOUS
Acid-base deficit: 5 mmol/L — ABNORMAL HIGH (ref 0.0–2.0)
Bicarbonate: 21.1 mmol/L (ref 20.0–28.0)
Delivery systems: POSITIVE
FIO2: 0.5
O2 Saturation: 62.5 %
PEEP: 6 cmH2O
Patient temperature: 37
Pressure support: 12 cmH2O
pCO2, Ven: 42 mmHg — ABNORMAL LOW (ref 44.0–60.0)
pH, Ven: 7.31 (ref 7.250–7.430)
pO2, Ven: 36 mmHg (ref 32.0–45.0)

## 2020-02-18 LAB — CBC WITH DIFFERENTIAL/PLATELET
Abs Immature Granulocytes: 0.1 10*3/uL — ABNORMAL HIGH (ref 0.00–0.07)
Basophils Absolute: 0.1 10*3/uL (ref 0.0–0.1)
Basophils Relative: 0 %
Eosinophils Absolute: 0.3 10*3/uL (ref 0.0–0.5)
Eosinophils Relative: 2 %
HCT: 31.7 % — ABNORMAL LOW (ref 36.0–46.0)
Hemoglobin: 10.2 g/dL — ABNORMAL LOW (ref 12.0–15.0)
Immature Granulocytes: 1 %
Lymphocytes Relative: 13 %
Lymphs Abs: 2.2 10*3/uL (ref 0.7–4.0)
MCH: 29.4 pg (ref 26.0–34.0)
MCHC: 32.2 g/dL (ref 30.0–36.0)
MCV: 91.4 fL (ref 80.0–100.0)
Monocytes Absolute: 1.3 10*3/uL — ABNORMAL HIGH (ref 0.1–1.0)
Monocytes Relative: 8 %
Neutro Abs: 13.3 10*3/uL — ABNORMAL HIGH (ref 1.7–7.7)
Neutrophils Relative %: 76 %
Platelets: 299 10*3/uL (ref 150–400)
RBC: 3.47 MIL/uL — ABNORMAL LOW (ref 3.87–5.11)
RDW: 13.2 % (ref 11.5–15.5)
WBC: 17.2 10*3/uL — ABNORMAL HIGH (ref 4.0–10.5)
nRBC: 0 % (ref 0.0–0.2)

## 2020-02-18 LAB — BASIC METABOLIC PANEL
Anion gap: 12 (ref 5–15)
Anion gap: 10 (ref 5–15)
Anion gap: 13 (ref 5–15)
Anion gap: 14 (ref 5–15)
BUN: 32 mg/dL — ABNORMAL HIGH (ref 8–23)
BUN: 33 mg/dL — ABNORMAL HIGH (ref 8–23)
BUN: 35 mg/dL — ABNORMAL HIGH (ref 8–23)
BUN: 49 mg/dL — ABNORMAL HIGH (ref 8–23)
CO2: 19 mmol/L — ABNORMAL LOW (ref 22–32)
CO2: 21 mmol/L — ABNORMAL LOW (ref 22–32)
CO2: 19 mmol/L — ABNORMAL LOW (ref 22–32)
CO2: 18 mmol/L — ABNORMAL LOW (ref 22–32)
Calcium: 8.6 mg/dL — ABNORMAL LOW (ref 8.9–10.3)
Calcium: 8.6 mg/dL — ABNORMAL LOW (ref 8.9–10.3)
Calcium: 8.9 mg/dL (ref 8.9–10.3)
Calcium: 9.1 mg/dL (ref 8.9–10.3)
Chloride: 97 mmol/L — ABNORMAL LOW (ref 98–111)
Chloride: 100 mmol/L (ref 98–111)
Chloride: 99 mmol/L (ref 98–111)
Chloride: 97 mmol/L — ABNORMAL LOW (ref 98–111)
Creatinine, Ser: 1.21 mg/dL — ABNORMAL HIGH (ref 0.44–1.00)
Creatinine, Ser: 1.12 mg/dL — ABNORMAL HIGH (ref 0.44–1.00)
Creatinine, Ser: 1.49 mg/dL — ABNORMAL HIGH (ref 0.44–1.00)
Creatinine, Ser: 1.68 mg/dL — ABNORMAL HIGH (ref 0.44–1.00)
GFR calc Af Amer: 48 mL/min — ABNORMAL LOW (ref >60)
GFR calc Af Amer: 53 mL/min — ABNORMAL LOW (ref >60)
GFR calc Af Amer: 37 mL/min — ABNORMAL LOW (ref >60)
GFR calc Af Amer: 32 mL/min — ABNORMAL LOW (ref >60)
GFR calc non Af Amer: 41 mL/min — ABNORMAL LOW (ref >60)
GFR calc non Af Amer: 45 mL/min — ABNORMAL LOW (ref >60)
GFR calc non Af Amer: 32 mL/min — ABNORMAL LOW (ref >60)
GFR calc non Af Amer: 28 mL/min — ABNORMAL LOW (ref >60)
Glucose, Bld: 253 mg/dL — ABNORMAL HIGH (ref 70–99)
Glucose, Bld: 268 mg/dL — ABNORMAL HIGH (ref 70–99)
Glucose, Bld: 321 mg/dL — ABNORMAL HIGH (ref 70–99)
Glucose, Bld: 286 mg/dL — ABNORMAL HIGH (ref 70–99)
Potassium: 4 mmol/L (ref 3.5–5.1)
Potassium: 4.7 mmol/L (ref 3.5–5.1)
Potassium: 4.6 mmol/L (ref 3.5–5.1)
Potassium: 4.7 mmol/L (ref 3.5–5.1)
Sodium: 128 mmol/L — ABNORMAL LOW (ref 135–145)
Sodium: 131 mmol/L — ABNORMAL LOW (ref 135–145)
Sodium: 131 mmol/L — ABNORMAL LOW (ref 135–145)
Sodium: 129 mmol/L — ABNORMAL LOW (ref 135–145)

## 2020-02-18 LAB — RESPIRATORY PANEL BY RT PCR (FLU A&B, COVID)
Influenza A by PCR: NEGATIVE
Influenza B by PCR: NEGATIVE
SARS Coronavirus 2 by RT PCR: NEGATIVE

## 2020-02-18 LAB — GLUCOSE, CAPILLARY
Glucose-Capillary: 248 mg/dL — ABNORMAL HIGH (ref 70–99)
Glucose-Capillary: 254 mg/dL — ABNORMAL HIGH (ref 70–99)
Glucose-Capillary: 266 mg/dL — ABNORMAL HIGH (ref 70–99)
Glucose-Capillary: 276 mg/dL — ABNORMAL HIGH (ref 70–99)
Glucose-Capillary: 278 mg/dL — ABNORMAL HIGH (ref 70–99)
Glucose-Capillary: 330 mg/dL — ABNORMAL HIGH (ref 70–99)

## 2020-02-18 LAB — LACTIC ACID, PLASMA
Lactic Acid, Venous: 2.5 mmol/L (ref 0.5–1.9)
Lactic Acid, Venous: 1.8 mmol/L (ref 0.5–1.9)

## 2020-02-18 LAB — HEMOGLOBIN A1C
Hgb A1c MFr Bld: 6.3 % — ABNORMAL HIGH (ref 4.8–5.6)
Mean Plasma Glucose: 134.11 mg/dL

## 2020-02-18 LAB — URINALYSIS, MICROSCOPIC (REFLEX)

## 2020-02-18 LAB — URINALYSIS, ROUTINE W REFLEX MICROSCOPIC
Bilirubin Urine: NEGATIVE
Glucose, UA: NEGATIVE mg/dL
Hgb urine dipstick: NEGATIVE
Ketones, ur: NEGATIVE mg/dL
Nitrite: NEGATIVE
Protein, ur: 30 mg/dL — AB
Specific Gravity, Urine: 1.01 (ref 1.005–1.030)
pH: 5.5 (ref 5.0–8.0)

## 2020-02-18 LAB — SODIUM, URINE, RANDOM: Sodium, Ur: 65 mmol/L

## 2020-02-18 LAB — PROCALCITONIN: Procalcitonin: <0.1 ng/mL

## 2020-02-18 LAB — OSMOLALITY: Osmolality: 299 mOsm/kg — ABNORMAL HIGH (ref 275–295)

## 2020-02-18 LAB — TROPONIN I (HIGH SENSITIVITY)
Troponin I (High Sensitivity): 67 ng/L — ABNORMAL HIGH (ref <18)
Troponin I (High Sensitivity): 143 ng/L (ref <18)
Troponin I (High Sensitivity): 193 ng/L (ref <18)
Troponin I (High Sensitivity): 202 ng/L (ref <18)

## 2020-02-18 LAB — BRAIN NATRIURETIC PEPTIDE: B Natriuretic Peptide: 600 pg/mL — ABNORMAL HIGH (ref 0.0–100.0)

## 2020-02-18 LAB — OSMOLALITY, URINE: Osmolality, Ur: 277 mOsm/kg — ABNORMAL LOW (ref 300–900)

## 2020-02-18 MED ORDER — CLOPIDOGREL BISULFATE 75 MG PO TABS
37.5000 mg | ORAL_TABLET | Freq: Every day | ORAL | Status: DC
  Administered 2020-02-18 – 2020-02-26 (×9): 37.5 mg via ORAL
  Filled 2020-02-18 (×9): qty 1

## 2020-02-18 MED ORDER — SODIUM CHLORIDE 0.9 % IV SOLN: 250.0000 mL | INTRAVENOUS | Status: DC | PRN

## 2020-02-18 MED ORDER — HYDRALAZINE HCL 20 MG/ML IJ SOLN: 5.0000 mg | INTRAMUSCULAR | Status: DC | PRN

## 2020-02-18 MED ORDER — INSULIN GLARGINE 100 UNIT/ML ~~LOC~~ SOLN
8.0000 [IU] | Freq: Every day | SUBCUTANEOUS | Status: DC
  Administered 2020-02-18 – 2020-02-20 (×3): 8 [IU] via SUBCUTANEOUS
  Filled 2020-02-18 (×3): qty 0.08

## 2020-02-18 MED ORDER — BACLOFEN 10 MG PO TABS
10.0000 mg | ORAL_TABLET | Freq: Every day | ORAL | Status: DC | PRN
  Filled 2020-02-18: qty 1

## 2020-02-18 MED ORDER — ROSUVASTATIN CALCIUM 5 MG PO TABS
5.0000 mg | ORAL_TABLET | ORAL | Status: DC
  Administered 2020-02-19 – 2020-02-26 (×5): 5 mg via ORAL
  Filled 2020-02-18 (×5): qty 1

## 2020-02-18 MED ORDER — OCUVITE-LUTEIN PO CAPS
1.0000 | ORAL_CAPSULE | Freq: Two times a day (BID) | ORAL | Status: DC
  Administered 2020-02-18 – 2020-02-26 (×16): 1 via ORAL
  Filled 2020-02-18 (×19): qty 1

## 2020-02-18 MED ORDER — ONDANSETRON HCL 4 MG/2ML IJ SOLN: 4.0000 mg | Freq: Three times a day (TID) | INTRAMUSCULAR | Status: DC | PRN

## 2020-02-18 MED ORDER — FUROSEMIDE 10 MG/ML IJ SOLN
20.0000 mg | Freq: Once | INTRAMUSCULAR | Status: AC
  Administered 2020-02-18: 20 mg via INTRAVENOUS
  Filled 2020-02-18: qty 4

## 2020-02-18 MED ORDER — SODIUM CHLORIDE 0.9 % IV SOLN
1.0000 g | Freq: Once | INTRAVENOUS | Status: AC
  Administered 2020-02-18: 1 g via INTRAVENOUS
  Filled 2020-02-18: qty 10

## 2020-02-18 MED ORDER — MELATONIN 5 MG PO TABS
2.5000 mg | ORAL_TABLET | Freq: Every day | ORAL | Status: DC
  Administered 2020-02-18 – 2020-02-25 (×8): 2.5 mg via ORAL
  Filled 2020-02-18 (×8): qty 1

## 2020-02-18 MED ORDER — SODIUM CHLORIDE 0.9% FLUSH
3.0000 mL | Freq: Two times a day (BID) | INTRAVENOUS | Status: DC
  Administered 2020-02-18 – 2020-02-20 (×5): 3 mL via INTRAVENOUS

## 2020-02-18 MED ORDER — ENOXAPARIN SODIUM 30 MG/0.3ML ~~LOC~~ SOLN
30.0000 mg | SUBCUTANEOUS | Status: DC
  Administered 2020-02-18 – 2020-02-19 (×2): 30 mg via SUBCUTANEOUS
  Filled 2020-02-18 (×3): qty 0.3

## 2020-02-18 MED ORDER — SODIUM CHLORIDE 0.9 % IV SOLN
500.0000 mg | Freq: Once | INTRAVENOUS | Status: AC
  Administered 2020-02-18: 500 mg via INTRAVENOUS
  Filled 2020-02-18: qty 500

## 2020-02-18 MED ORDER — FERROUS SULFATE 325 (65 FE) MG PO TABS
325.0000 mg | ORAL_TABLET | Freq: Every day | ORAL | Status: DC
  Administered 2020-02-19 – 2020-02-26 (×8): 325 mg via ORAL
  Filled 2020-02-18 (×8): qty 1

## 2020-02-18 MED ORDER — GLUCERNA SHAKE PO LIQD
237.0000 mL | Freq: Three times a day (TID) | ORAL | Status: DC
  Administered 2020-02-18 – 2020-02-26 (×22): 237 mL via ORAL

## 2020-02-18 MED ORDER — GABAPENTIN 100 MG PO CAPS
100.0000 mg | ORAL_CAPSULE | Freq: Every day | ORAL | Status: DC
  Administered 2020-02-18 – 2020-02-19 (×2): 100 mg via ORAL
  Filled 2020-02-18: qty 1
  Filled 2020-02-18: qty 2
  Filled 2020-02-18: qty 1

## 2020-02-18 MED ORDER — LACTATED RINGERS IV BOLUS: 500.0000 mL | Freq: Once | INTRAVENOUS | Status: DC

## 2020-02-18 MED ORDER — NITROGLYCERIN 0.4 MG SL SUBL: 0.4000 mg | SUBLINGUAL_TABLET | SUBLINGUAL | Status: DC | PRN

## 2020-02-18 MED ORDER — VITAMIN B-12 1000 MCG PO TABS
1000.0000 ug | ORAL_TABLET | Freq: Every day | ORAL | Status: DC
  Administered 2020-02-18 – 2020-02-20 (×3): 1000 ug via ORAL
  Filled 2020-02-18 (×3): qty 1

## 2020-02-18 MED ORDER — INSULIN ASPART 100 UNIT/ML ~~LOC~~ SOLN
0.0000 [IU] | SUBCUTANEOUS | Status: DC
  Administered 2020-02-18 (×3): 5 [IU] via SUBCUTANEOUS
  Administered 2020-02-18: 21:00:00 7 [IU] via SUBCUTANEOUS
  Administered 2020-02-19: 2 [IU] via SUBCUTANEOUS
  Administered 2020-02-19: 3 [IU] via SUBCUTANEOUS
  Administered 2020-02-19: 7 [IU] via SUBCUTANEOUS
  Administered 2020-02-19: 2 [IU] via SUBCUTANEOUS
  Administered 2020-02-19: 9 [IU] via SUBCUTANEOUS
  Administered 2020-02-19 – 2020-02-20 (×3): 5 [IU] via SUBCUTANEOUS
  Administered 2020-02-20: 3 [IU] via SUBCUTANEOUS
  Filled 2020-02-18 (×13): qty 1

## 2020-02-18 MED ORDER — LOSARTAN POTASSIUM 50 MG PO TABS
50.0000 mg | ORAL_TABLET | Freq: Every day | ORAL | Status: DC
  Administered 2020-02-18 – 2020-02-19 (×2): 50 mg via ORAL
  Filled 2020-02-18 (×2): qty 1

## 2020-02-18 MED ORDER — SODIUM CHLORIDE 0.9% FLUSH: 3.0000 mL | INTRAVENOUS | Status: DC | PRN

## 2020-02-18 MED ORDER — DILTIAZEM HCL ER COATED BEADS 180 MG PO CP24
180.0000 mg | ORAL_CAPSULE | Freq: Two times a day (BID) | ORAL | Status: DC
  Administered 2020-02-18 – 2020-02-20 (×5): 180 mg via ORAL
  Filled 2020-02-18 (×6): qty 1

## 2020-02-18 MED ORDER — ALBUTEROL SULFATE (2.5 MG/3ML) 0.083% IN NEBU
2.5000 mg | INHALATION_SOLUTION | RESPIRATORY_TRACT | Status: DC | PRN
  Administered 2020-02-18: 2.5 mg via RESPIRATORY_TRACT
  Filled 2020-02-18: qty 3

## 2020-02-18 MED ORDER — IPRATROPIUM-ALBUTEROL 0.5-2.5 (3) MG/3ML IN SOLN
3.0000 mL | RESPIRATORY_TRACT | Status: DC
  Administered 2020-02-18 – 2020-02-19 (×5): 3 mL via RESPIRATORY_TRACT
  Filled 2020-02-18 (×6): qty 3

## 2020-02-18 MED ORDER — DM-GUAIFENESIN ER 30-600 MG PO TB12
1.0000 | ORAL_TABLET | Freq: Two times a day (BID) | ORAL | Status: DC | PRN
  Administered 2020-02-21: 1 via ORAL
  Filled 2020-02-18: qty 1

## 2020-02-18 MED ORDER — ACETAMINOPHEN 325 MG PO TABS
650.0000 mg | ORAL_TABLET | Freq: Four times a day (QID) | ORAL | Status: DC | PRN
  Administered 2020-02-23 – 2020-02-24 (×2): 650 mg via ORAL
  Filled 2020-02-18 (×2): qty 2

## 2020-02-18 NOTE — ED Notes (Addendum)
RT at bedside to eval pt and her request to be tried off of bi-pap RT removed bi-pap and placed on O2 via n/c Pt is aware to call if any feelings of SHOB or difficulty moving air - pt verbalizes understanding  Admitting provider notified of critical troponin of 143 - no new orders and admitting provider notified this nurse that cardiologist was being consulted

## 2020-02-18 NOTE — ED Notes (Signed)
Dietary contacted to send glucerna shake

## 2020-02-18 NOTE — Progress Notes (Signed)
Ch visited with Pt in response to [place]. Upon arrival Ch asked Pt if she had requested for prayer, and Pt said "yes." Pt stated that she has been feeling particularly depressed after hearing that her heart is not functioning well; previous admissions were about stomach problems. Pt says she's also been coughing and there is fluid in her lungs. Pt spoke about 15 year marriage and how her husband has dementia and probably does not even know that she is missing in the house; Pt says she was brought in around 4 am. Pt reported that a friend visits often to give company to husband. Pt stated that she had forgotten her glasses and is not able to see the board. Ch prayed with Pt. Pt stated that she feels relaxed after prayer. Ch let Pt know 24/7 availability of chaplains and left.

## 2020-02-18 NOTE — ED Notes (Signed)
Admitting provider notified that pt is tolerating O2 via n/c - provider is changing bed placement order

## 2020-02-18 NOTE — ED Triage Notes (Signed)
Patient presents to Emergency Department via [facility] from home with complaints of difficulty breathing. Pt arrive on CPAP, per EMS pt woke gasping, and rescue found pt at 79% on RA.    EMS gave 125 solumedrol and 1.5 duonebs, pt improved to 98%. NSR, pt reports cough and former smoker.

## 2020-02-18 NOTE — TOC Progression Note (Signed)
Transition of Care [facility]) - Progression Note    Patient Details  Name: Jill Hudson MRN: [id number] Date of Birth: August 28, 1935  Transition of Care [facility]) CM/SW Contact  Meriel Flavors, LCSW Phone Number: 02/18/2020, 3:02 PM  Clinical Narrative:    4/25: 3:00pm Spoke with daughter Judson Roch, she feels like it would be better if pt comes home with [facility]. Pt's husband has dementia and daughter cares for both of them. Met with pt. And offered choices for both [facility] and SNF, will discuss with daughter and wait for PT recommendations          Expected Discharge Plan and Services                                                 Social Determinants of Health (SDOH) Interventions    Readmission Risk Interventions Readmission Risk Prevention Plan 10/15/2018  Transportation Screening Complete  PCP or Specialist Appt within 5-7 Days Complete  Home Care Screening Complete  Medication Review (RN CM) Complete  Some recent data might be hidden

## 2020-02-18 NOTE — ED Notes (Signed)
Pt reports no SHOB and is sat'ing 95% on 4L O2 via n/c Pt given breakfast tray

## 2020-02-18 NOTE — ED Notes (Signed)
CBG: 276 

## 2020-02-18 NOTE — ED Notes (Signed)
Pt given lunch tray.

## 2020-02-18 NOTE — ED Notes (Signed)
Pharmacy messaged to send medications that come from main pharmacy

## 2020-02-18 NOTE — Progress Notes (Signed)
CODE SEPSIS - PHARMACY COMMUNICATION  **[facility] Antibiotics should be administered within 1 hour of Sepsis diagnosis**  Time Code Sepsis Called/Page Received: 0708  Antibiotics Ordered: azithromycin and azithromycin  Time of 1st antibiotic administration: 0650  Additional action taken by pharmacy: none required  If necessary, Name of Provider/Nurse Contacted: N/A    Dallie Piles ,PharmD Clinical Pharmacist  02/18/2020  7:15 AM

## 2020-02-18 NOTE — ED Provider Notes (Signed)
CAD  [facility] Emergency Department Provider Note  ____________________________________________  Time seen: Approximately 6:17 AM  I have reviewed the triage vital signs and the nursing notes.   HISTORY  Chief Complaint Respiratory Distress   HPI Jill Hudson is a 85 y.o. female with history of CHF with preserved EF, peripheral artery disease with stent on her left lower extremity on Plavix, hypertension, hyperlipidemia, diabetes  who presents for evaluation of shortness of breath.  Patient reports being in her usual state of health when she went to bed last night.  She woke up in the middle of the night with severe shortness of breath which prompted her to call 911.  When EMS arrived the patient was found to be hypoxic to 79% on room air.  She was put on CPAP, given Solu-Medrol and 2 duo nebs.  Patient denies history of COPD or emphysema but she is a former smoker.  Denies history of PE or DVT, recent travel immobilization, leg pain or swelling, hemoptysis or exogenous hormones.  Denies any fever.  She is status post second Covid shot 30 days ago.  Denies chest pain or abdominal pain.  She denies changes in weight.  Past Medical History:  Diagnosis Date  . Allergies   . Anxiety   . Arthritis    spine and shoulder  . Atherosclerosis of artery of extremity with rest pain ([place]) 10/12/2018  . Cancer ([place])    skin  . Diabetes mellitus without complication ([place])   . Heart murmur   . Hyperlipidemia   . Hypertension   . Macula lutea degeneration   . Mitral and aortic valve disease   . Myocardial infarction [facility])    may have had a "light" heart attack  . Occasional tremors   . PAD (peripheral artery disease) ([place])   . Shingles    patient unaware but daughter confirms. it was a long time ago  . Stroke [facility]) 01/2017   may have had a slight stroke  . TIA (transient ischemic attack) 01/2017  . UTI (urinary tract infection)   . Vascular disease, peripheral  [facility])     Patient Active Problem List   Diagnosis Date Noted  . Malnutrition of mild degree ([place]) 01/08/2020  . Acute blood loss anemia   . Diverticulosis of large intestine with hemorrhage   . Acute kidney injury superimposed on CKD ([place])   . GI bleeding 12/25/2019  . Rectal bleeding   . Age-related macular degeneration, dry, left eye 06/21/2019  . Age-related macular degeneration, wet, right eye ([place]) 06/21/2019  . Moderate nonproliferative diabetic retinopathy associated with type 2 diabetes mellitus ([place]) 06/21/2019  . Lumbosacral radiculopathy at L4 05/01/2019  . Underweight 05/01/2019  . Encounter for long-term (current) use of aspirin 10/31/2018  . Encounter for long-term (current) use of antiplatelets/antithrombotics 10/31/2018  . Long term current use of oral hypoglycemic drug 10/31/2018  . Encounter for long-term (current) use of insulin ([place]) 10/31/2018  . GIB (gastrointestinal bleeding) 08/11/2018  . Leg pain 07/03/2017  . Carpal tunnel syndrome on both sides 05/05/2017  . Myalgia due to HMG CoA reductase inhibitor 05/05/2017  . History of CVA (cerebrovascular accident) 02/15/2017  . Degenerative disc disease, lumbar 12/21/2016  . Atherosclerosis of native arteries of extremity with intermittent claudication ([place]) 12/20/2016  . Bilateral carotid artery stenosis 12/20/2016  . Hip bursitis 05/18/2016  . Elevated TSH 01/18/2016  . CKD stage 3 due to type 2 diabetes mellitus ([place]) 01/16/2016  . Senile ecchymosis 01/16/2016  .  Type II diabetes mellitus with complication ([place]) 66/03/3015  . GERD (gastroesophageal reflux disease) 07/24/2015  . PVD (peripheral vascular disease) ([place]) 05/17/2015  . Neoplasm of uncertain behavior of skin 05/17/2015  . Retinopathy, diabetic, proliferative ([place]) 04/11/2015  . Hyperlipidemia 04/11/2015  . Essential hypertension 04/11/2015  . Generalized OA 04/11/2015  . Arteriosclerosis of coronary artery 05/29/2013  . Hypertensive heart  disease without CHF 05/29/2013    Past Surgical History:  Procedure Laterality Date  . CARDIAC CATHETERIZATION  1998   40% LM, 95% Ramus interm  . CATARACT EXTRACTION, BILATERAL    . COLONOSCOPY WITH PROPOFOL N/A 08/12/2018   Procedure: COLONOSCOPY WITH PROPOFOL;  Surgeon: Lucilla Lame, MD;  Location: [facility] ENDOSCOPY;  Service: Endoscopy;  Laterality: N/A;  . ENDARTERECTOMY FEMORAL Left 10/12/2018   Procedure: ENDARTERECTOMY FEMORAL;  Surgeon: Katha Cabal, MD;  Location: [facility] ORS;  Service: Vascular;  Laterality: Left;  angioplasty and left SFA stent placement  . EYE SURGERY Bilateral    cataract extractions  . LOWER EXTREMITY ANGIOGRAPHY Left 08/23/2017   Procedure: Lower Extremity Angiography;  Surgeon: Algernon Huxley, MD;  Location: [place] CV LAB;  Service: Cardiovascular;  Laterality: Left;  . LOWER EXTREMITY ANGIOGRAPHY Left 07/26/2018   Procedure: LOWER EXTREMITY ANGIOGRAPHY;  Surgeon: Katha Cabal, MD;  Location: [place] CV LAB;  Service: Cardiovascular;  Laterality: Left;  . LOWER EXTREMITY ANGIOGRAPHY Left 09/16/2018   Procedure: LOWER EXTREMITY ANGIOGRAPHY;  Surgeon: Katha Cabal, MD;  Location: [place] CV LAB;  Service: Cardiovascular;  Laterality: Left;  . PTCA  08/2013   Left common iliac  . PTCA  12/2012   left ext iliac  . TUBAL LIGATION      Prior to Admission medications   Medication Sig Start Date End Date Taking? Authorizing Provider  acetaminophen (TYLENOL) 650 MG CR tablet Take 1,300 mg by mouth every 8 (eight) hours as needed for pain.    [provider]  B-D ULTRAFINE III SHORT PEN 31G X 8 MM MISC USE AS DIRECTED 12/19/18   [name], MD  baclofen (LIORESAL) 10 MG tablet TAKE 1 TABLET(10 MG) BY MOUTH THREE TIMES DAILY Patient taking differently: Take 10 mg by mouth at bedtime.  02/09/18   [name], MD  chlorthalidone (HYGROTON) 25 MG tablet Take by mouth. 01/03/20   [provider]  clopidogrel  (PLAVIX) 75 MG tablet TAKE 1 TABLET BY MOUTH EVERY DAY 02/14/20   Schnier, Dolores Lory, MD  Cyanocobalamin (RA VITAMIN B-12 TR) 1000 MCG TBCR Take 1,000 mcg by mouth daily.     [provider]  diltiazem (CARTIA XT) 180 MG 24 hr capsule Take 180 mg by mouth 2 (two) times daily.     [provider]  feeding supplement, GLUCERNA SHAKE, (GLUCERNA SHAKE) LIQD Take 237 mLs by mouth 3 (three) times daily between meals. 12/28/19   Loletha Grayer, MD  ferrous sulfate 325 (65 FE) MG tablet Take 325 mg by mouth daily with breakfast.    [provider]  gabapentin (NEURONTIN) 100 MG capsule TAKE 1 TO 2 CAPSULES(100 TO 200 MG) BY MOUTH AT BEDTIME Patient taking differently: Take 100-200 mg by mouth at bedtime.  08/30/19   [name], MD  insulin aspart (NOVOLOG FLEXPEN) 100 UNIT/ML FlexPen Inject 3 Units into the skin 3 (three) times daily with meals. At lunch Patient taking differently: Inject 2 Units into the skin daily. At breakfast 01/18/19   [name], MD  Insulin Glargine [facility])  100 UNIT/ML SOPN Inject 0.12 mLs (12 Units total) into the skin daily. 06/27/19   [name], MD  losartan (COZAAR) 50 MG tablet Take 50 mg by mouth daily.  01/03/20   [provider]  metFORMIN (GLUCOPHAGE-XR) 500 MG 24 hr tablet TAKE 1 TABLET BY MOUTH TWICE DAILY 03/25/19   [name], MD  Multiple Vitamins-Minerals (PRESERVISION AREDS PO) Take 1 capsule by mouth 2 (two) times daily.     [provider]  nitroGLYCERIN (NITROSTAT) 0.4 MG SL tablet 1 tab under the tongue for CP, may repeat in 5 minutes up to 3 doses 09/07/18   [name], MD  pantoprazole (PROTONIX) 40 MG tablet Take 1 tablet (40 mg total) by mouth daily. 12/29/19   Loletha Grayer, MD  rosuvastatin (CRESTOR) 5 MG tablet Take 5 mg by mouth every other day.  03/02/18   [provider]    Allergies Saxagliptin, Epinephrine, Atorvastatin, Codeine, Ezetimibe, Limonene,  Nitrofurantoin, and Sulfa antibiotics  Family History  Problem Relation Age of Onset  . Dementia Mother   . Diabetes Father     Social History Social History   Tobacco Use  . Smoking status: Former Smoker    Packs/day: 2.00    Years: 37.00    Pack years: 74.00    Types: Cigarettes    Quit date: 1980    Years since quitting: 41.3  . Smokeless tobacco: Never Used  . Tobacco comment: smoking cessation materials not required  Substance Use Topics  . Alcohol use: No    Alcohol/week: 0.0 standard drinks  . Drug use: No    Review of Systems  Constitutional: Negative for fever. Eyes: Negative for visual changes. ENT: Negative for sore throat. Neck: No neck pain  Cardiovascular: Negative for chest pain. Respiratory: + shortness of breath. Gastrointestinal: Negative for abdominal pain, vomiting or diarrhea. Genitourinary: Negative for dysuria. Musculoskeletal: Negative for back pain. Skin: Negative for rash. Neurological: Negative for headaches, weakness or numbness. Psych: No SI or HI  ____________________________________________   PHYSICAL EXAM:  VITAL SIGNS: ED Triage Vitals  Enc Vitals Group     BP 02/18/20 0515 (!) 160/62     Pulse Rate 02/18/20 0515 81     Resp 02/18/20 0515 (!) 27     Temp 02/18/20 0515 97.7 F (36.5 C)     Temp Source 02/18/20 0515 Axillary     SpO2 02/18/20 0515 100 %     Weight 02/18/20 0605 117 lb (53.1 kg)     Height 02/18/20 0605 5' (1.524 m)     Head Circumference --      Peak Flow --      Pain Score 02/18/20 0521 4     Pain Loc --      Pain Edu? --      Excl. in [place]? --     Constitutional: Alert and oriented, severe respiratory distress.  HEENT:      Head: Normocephalic and atraumatic.         Eyes: Conjunctivae are normal. Sclera is non-icteric.       Mouth/Throat: Mucous membranes are moist.       Neck: Supple with no signs of meningismus. Cardiovascular: Regular rate and rhythm. No murmurs, gallops, or rubs. 2+  symmetrical distal pulses are present in all extremities. No JVD. Respiratory: Severe respiratory distress on CPAP with crackles bilaterally Gastrointestinal: Soft, non tender, and non distended  Musculoskeletal:  No edema, cyanosis, or erythema of extremities. Neurologic: Normal speech and  language. Face is symmetric. Moving all extremities. No gross focal neurologic deficits are appreciated. Skin: Skin is warm, dry and intact. No rash noted. Psychiatric: Mood and affect are normal. Speech and behavior are normal.  ____________________________________________   LABS (all labs ordered are listed, but only abnormal results are displayed)  Labs Reviewed  BLOOD GAS, VENOUS - Abnormal; Notable for the following components:      Result Value   pCO2, Ven 42 (*)    Acid-base deficit 5.0 (*)    All other components within normal limits  BRAIN NATRIURETIC PEPTIDE - Abnormal; Notable for the following components:   B Natriuretic Peptide 600.0 (*)    All other components within normal limits  CBC WITH DIFFERENTIAL/PLATELET - Abnormal; Notable for the following components:   WBC 17.2 (*)    RBC 3.47 (*)    Hemoglobin 10.2 (*)    HCT 31.7 (*)    Neutro Abs 13.3 (*)    Monocytes Absolute 1.3 (*)    Abs Immature Granulocytes 0.10 (*)    All other components within normal limits  BASIC METABOLIC PANEL - Abnormal; Notable for the following components:   Sodium 128 (*)    Chloride 97 (*)    CO2 19 (*)    Glucose, Bld 253 (*)    BUN 32 (*)    Creatinine, Ser 1.21 (*)    Calcium 8.6 (*)    GFR calc non Af Amer 41 (*)    GFR calc Af Amer 48 (*)    All other components within normal limits  LACTIC ACID, PLASMA - Abnormal; Notable for the following components:   Lactic Acid, Venous 2.5 (*)    All other components within normal limits  TROPONIN I (HIGH SENSITIVITY) - Abnormal; Notable for the following components:   Troponin I (High Sensitivity) 67 (*)    All other components within normal  limits  RESPIRATORY PANEL BY RT PCR (FLU A&B, COVID)  CULTURE, BLOOD (ROUTINE X 2)  CULTURE, BLOOD (ROUTINE X 2)  LACTIC ACID, PLASMA  PROCALCITONIN  TROPONIN I (HIGH SENSITIVITY)   ____________________________________________  EKG  ED ECG REPORT I, Rudene Re, the attending physician, personally viewed and interpreted this ECG.  Normal sinus rhythm, rate of 81, normal intervals, normal axis, no ST elevations or depressions, diffuse T wave flattening. ____________________________________________  RADIOLOGY  I have personally reviewed the images performed during this visit and I agree with the Radiologist's read.   Interpretation by Radiologist:  DG Chest Portable 1 View  Result Date: 02/18/2020 CLINICAL DATA:  Initial evaluation for acute shortness of breath. EXAM: PORTABLE CHEST 1 VIEW COMPARISON:  Prior radiograph from 12/25/2019 FINDINGS: Transverse heart size stable, and remains within normal limits. Mediastinal silhouette normal. Aortic atherosclerosis. Lungs are hyperinflated with underlying emphysematous changes. Diffusely increased vascularity with interstitial prominence consistent with superimposed pulmonary interstitial edema. Associated small left pleural effusion. Hazy bibasilar opacities favored to reflect atelectasis and/or edema. Superimposed infection would be difficult to exclude, and could be considered in the correct clinical setting. No pneumothorax. No acute osseous finding. Thoracolumbar scoliosis noted. IMPRESSION: 1. Findings consistent with moderate diffuse pulmonary interstitial edema. Hazy bibasilar opacities, favored to reflect atelectasis and/or edema. Superimposed infection would be difficult to exclude, and could be considered in the correct clinical setting. 2. Associated small left pleural effusion. 3. Aortic Atherosclerosis (ICD10-I70.0) and Emphysema (ICD10-J43.9). Electronically Signed   By: Jeannine Boga M.D.   On: 02/18/2020 06:03     ____________________________________________   PROCEDURES  Procedure(s)  performed:yes .1-3 Lead EKG Interpretation Performed by: Rudene Re, MD Authorized by: Rudene Re, MD     Interpretation: normal     ECG rate:  98   ECG rate assessment: normal     Rhythm: sinus rhythm     Ectopy: none     Conduction: normal     Critical Care performed: yes CRITICAL CARE Performed by: Rudene Re  ?  Total critical care time: 45 min  Critical care time was exclusive of separately billable procedures and treating other patients.  Critical care was necessary to treat or prevent imminent or life-threatening deterioration.  Critical care was time spent personally by me on the following activities: development of treatment plan with patient and/or surrogate as well as nursing, discussions with consultants, evaluation of patient's response to treatment, examination of patient, obtaining history from patient or surrogate, ordering and performing treatments and interventions, ordering and review of laboratory studies, ordering and review of radiographic studies, pulse oximetry and re-evaluation of patient's condition.  ____________________________________________   INITIAL IMPRESSION / ASSESSMENT AND PLAN / ED COURSE   85 y.o. female with history of CHF with preserved EF, peripheral artery disease with stent on her left lower extremity on Plavix, hypertension, hyperlipidemia, diabetes  who presents for evaluation of sudden onset of shortness of breath or respiratory distress this evening.  Patient arrives in severe respiratory distress, hypoxic requiring CPAP but with the crackles bilaterally.  She does look euvolemic with no pitting edema or elevated JVD.  Review of prior medical record shows that last echo was in 2018 showing normal EF.  She has had no signs or symptoms of infectious etiology.  She is status post Covid vaccination 30 days ago.  Patient was transition  immediately to BiPAP.  She had received a Solu-Medrol and duo nebs prior to arrival.  EKG with no acute ischemic or dysrhythmic changes.  Differential diagnoses including flash pulmonary edema versus CHF exacerbation versus possible undiagnosed COPD versus Covid versus pneumonia versus ACS.  Chest x-ray visualized and read by me as pulmonary edema.  Patient is afebrile but does have elevated lactic acidosis and a white count therefore cannot rule out pneumonia at this time.  Will treat for community-acquired with Rocephin and azithromycin.  Procalcitonin is pending.  Covid and flu swabs are negative.  BNP is elevated.  Will give Lasix.  Will avoid IV fluids to prevent worsening clinical status.  BG showing normal pH with a PCO2 of 42.  Initial troponin elevated 67 most likely demand ischemia in the setting of acute respiratory failure versus ACS.  Will trend troponin.  Patient has no chest pain.  Metabolic panel showed a glucose of 253 with a sodium of 128 but no anion gap.  After 1.5 hours on BiPAP patient was reassessed and looks markedly improved.  Normal work of breathing, sleeping comfortably when awaken able to speak in full sentences.      _____________________________________________ Please note:  Patient was evaluated in Emergency Department today for the symptoms described in the history of present illness. Patient was evaluated in the context of the global COVID-19 pandemic, which necessitated consideration that the patient might be at risk for infection with the SARS-CoV-2 virus that causes COVID-19. Institutional protocols and algorithms that pertain to the evaluation of patients at risk for COVID-19 are in a state of rapid change based on information released by regulatory bodies including the CDC and federal and state organizations. These policies and algorithms were followed during the patient's care in  the ED.  Some ED evaluations and interventions may be delayed as a result of limited  staffing during the pandemic.   [place] Controlled Substance Database was reviewed by me. ____________________________________________   FINAL CLINICAL IMPRESSION(S) / ED DIAGNOSES   Final diagnoses:  Acute respiratory failure with hypoxia (HCC)  Acute on chronic congestive heart failure, unspecified heart failure type ([place])  Community acquired pneumonia, unspecified laterality  Hyperglycemia      NEW MEDICATIONS STARTED DURING THIS VISIT:  ED Discharge Orders    None       Note:  This document was prepared using Dragon voice recognition software and may include unintentional dictation errors.    Alfred Levins, [place], MD 02/18/20 [phone]

## 2020-02-18 NOTE — Progress Notes (Signed)
Notified bedside nurse of need to draw repeat lactic acid. 

## 2020-02-18 NOTE — Consult Note (Signed)
CARDIOLOGY CONSULT NOTE       Patient ID: Jill Hudson MRN: [id number] DOB/AGE: 03-Dec-1934 85 y.o.  Admit date: 02/18/2020 Referring Physician: Blaine Hamper Primary Physician: [name], MD Primary Cardiologist: [facility] Reason for Consultation: CHF  Principal Problem:   Acute respiratory failure with hypoxia [facility]) Active Problems:   Essential hypertension   PVD (peripheral vascular disease) ([place])   Type II diabetes mellitus with renal manifestations ([place])   History of CVA (cerebrovascular accident)   CKD (chronic kidney disease), stage IIIa   Elevated troponin   Hyponatremia   Acute CHF (congestive heart failure) ([place])   Elevated lactic acid level   HPI:  85 y.o. with known vascular disease 40-59% bilateral carotid stenosis and previous left LE vascular surgery. No known CAD She indicates having cath over a decade ago but no recent stress testing She lives with her husband who has dementia and daughter. She quit smoking 40 years ago PMH indicates history of MI but she denies and indicates " they never could figure out what was going on " She had acute onset last night of dyspnea Sats in field by EMS 79% Improved with steroids and duonebs as well as bipap She has not had fever but dry non productive cough Chest discomfort only related to cough. In ER BNP 600 COVID negative CXR with pulmonary edema WBC elevated at 17.2  Troponin 67-> 143 ECG non acute SR rate 81 normal    ROS All other systems reviewed and negative except as noted above  Past Medical History:  Diagnosis Date  . Allergies   . Anxiety   . Arthritis    spine and shoulder  . Atherosclerosis of artery of extremity with rest pain ([place]) 10/12/2018  . Cancer ([place])    skin  . Diabetes mellitus without complication ([place])   . Heart murmur   . Hyperlipidemia   . Hypertension   . Macula lutea degeneration   . Mitral and aortic valve disease   . Myocardial infarction [facility])    may have had a "light"  heart attack  . Occasional tremors   . PAD (peripheral artery disease) ([place])   . Shingles    patient unaware but daughter confirms. it was a long time ago  . Stroke [facility]) 01/2017   may have had a slight stroke  . TIA (transient ischemic attack) 01/2017  . UTI (urinary tract infection)   . Vascular disease, peripheral ([place])     Family History  Problem Relation Age of Onset  . Dementia Mother   . Diabetes Father     Social History   Socioeconomic History  . Marital status: Married    Spouse name: kermit  . Number of children: 3  . Years of education: Not on file  . Highest education level: 12th grade  Occupational History  . Occupation: Retired    Comment: homemaker  Tobacco Use  . Smoking status: Former Smoker    Packs/day: 2.00    Years: 37.00    Pack years: 74.00    Types: Cigarettes    Quit date: 1980    Years since quitting: 41.3  . Smokeless tobacco: Never Used  . Tobacco comment: smoking cessation materials not required  Substance and Sexual Activity  . Alcohol use: No    Alcohol/week: 0.0 standard drinks  . Drug use: No  . Sexual activity: Not Currently  Other Topics Concern  . Not on file  Social History Narrative   Husband has  dementia and may wander. Requires constant supervision. Pt's daughter also lives with them. Her son Ollen Gross is a Garment/textile technologist.    Social Determinants of Health   Financial Resource Strain:   . Difficulty of Paying Living Expenses:   Food Insecurity: No Food Insecurity  . Worried About Charity fundraiser in the Last Year: Never true  . Ran Out of Food in the Last Year: Never true  Transportation Needs: No Transportation Needs  . Lack of Transportation (Medical): No  . Lack of Transportation (Non-Medical): No  Physical Activity:   . Days of Exercise per Week:   . Minutes of Exercise per Session:   Stress: No Stress Concern Present  . Feeling of Stress : Not at all  Social Connections:   . Frequency of Communication with Friends  and Family:   . Frequency of Social Gatherings with Friends and Family:   . Attends Religious Services:   . Active Member of Clubs or Organizations:   . Attends Archivist Meetings:   Marland Kitchen Marital Status:   Intimate Partner Violence: Not At Risk  . Fear of Current or Ex-Partner: No  . Emotionally Abused: No  . Physically Abused: No  . Sexually Abused: No    Past Surgical History:  Procedure Laterality Date  . CARDIAC CATHETERIZATION  1998   40% LM, 95% Ramus interm  . CATARACT EXTRACTION, BILATERAL    . COLONOSCOPY WITH PROPOFOL N/A 08/12/2018   Procedure: COLONOSCOPY WITH PROPOFOL;  Surgeon: Lucilla Lame, MD;  [facility] END[facility]COPY;  Service: Endoscopy;  Laterality: N/A;  . ENDARTERECTOMY FEMORAL Left 10/12/2018   Procedure: ENDARTERECTOMY FEMORAL;  Surgeon: Katha Cabal, MD;  Location: [facility] ORS;  Service: Vascular;  Laterality: Left;  angioplasty and left SFA stent placement  . EYE SURGERY Bilateral    cataract extractions  . LOWER EXTREMITY ANGIOGRAPHY Left 08/23/2017   Procedure: Lower Extremity Angiography;  Surgeon: Algernon Huxley, MD;  Location: [place] CV LAB;  Service: Cardiovascular;  Laterality: Left;  . LOWER EXTREMITY ANGIOGRAPHY Left 07/26/2018   Procedure: LOWER EXTREMITY ANGIOGRAPHY;  Surgeon: Katha Cabal, MD;  Location: [place] CV LAB;  Service: Cardiovascular;  Laterality: Left;  . LOWER EXTREMITY ANGIOGRAPHY Left 09/16/2018   Procedure: LOWER EXTREMITY ANGIOGRAPHY;  Surgeon: Katha Cabal, MD;  Location: [place] CV LAB;  Service: Cardiovascular;  Laterality: Left;  . PTCA  08/2013   Left common iliac  . PTCA  12/2012   left ext iliac  . TUBAL LIGATION        Current Facility-Administered Medications:  .  acetaminophen (TYLENOL) tablet 650 mg, 650 mg, Oral, Q6H PRN, Ivor Costa, MD .  albuterol (PROVENTIL) (2.5 MG/3ML) 0.083% nebulizer solution 2.5 mg, 2.5 mg, Nebulization, Q4H PRN, Ivor Costa, MD .   dextromethorphan-guaiFENesin ([place] DM) 30-600 MG per 12 hr tablet 1 tablet, 1 tablet, Oral, BID PRN, Ivor Costa, MD .  hydrALAZINE (APRESOLINE) injection 5 mg, 5 mg, Intravenous, Q2H PRN, Ivor Costa, MD .  insulin aspart (novoLOG) injection 0-9 Units, 0-9 Units, Subcutaneous, Q4H, Ivor Costa, MD, 5 Units at 02/18/20 [phone] .  ipratropium-albuterol (DUONEB) 0.5-2.5 (3) MG/3ML nebulizer solution 3 mL, 3 mL, Nebulization, Q4H, Ivor Costa, MD, 3 mL at 02/18/20 0834 .  ondansetron (ZOFRAN) injection 4 mg, 4 mg, Intravenous, Q8H PRN, Ivor Costa, MD  Current Outpatient Medications:  .  acetaminophen (TYLENOL) 650 MG CR tablet, Take 1,300 mg by mouth every 8 (eight) hours as needed for pain., Disp: , Rfl:  .  baclofen (LIORESAL) 10 MG tablet, TAKE 1 TABLET(10 MG) BY MOUTH THREE TIMES DAILY (Patient taking differently: Take 10 mg by mouth daily as needed. ), Disp: 270 tablet, Rfl: 0 .  chlorthalidone (HYGROTON) 25 MG tablet, Take 25 mg by mouth daily. , Disp: , Rfl:  .  clopidogrel (PLAVIX) 75 MG tablet, TAKE 1 TABLET BY MOUTH EVERY DAY (Patient taking differently: Take 37.5 mg by mouth daily. ), Disp: 30 tablet, Rfl: 1 .  Cyanocobalamin (RA VITAMIN B-12 TR) 1000 MCG TBCR, Take 1,000 mcg by mouth daily. , Disp: , Rfl:  .  diltiazem (CARTIA XT) 180 MG 24 hr capsule, Take 180 mg by mouth 2 (two) times daily. , Disp: , Rfl:  .  ferrous sulfate 325 (65 FE) MG tablet, Take 325 mg by mouth daily with breakfast., Disp: , Rfl:  .  gabapentin (NEURONTIN) 100 MG capsule, TAKE 1 TO 2 CAPSULES(100 TO 200 MG) BY MOUTH AT BEDTIME (Patient taking differently: Take 100-200 mg by mouth at bedtime. ), Disp: 180 capsule, Rfl: 6 .  insulin aspart (NOVOLOG FLEXPEN) 100 UNIT/ML FlexPen, Inject 3 Units into the skin 3 (three) times daily with meals. At lunch (Patient taking differently: Inject 2 Units into the skin daily. At breakfast), Disp: 15 mL, Rfl: 3 .  Insulin Glargine (BASAGLAR KWIKPEN) 100 UNIT/ML SOPN, Inject 0.12 mLs  (12 Units total) into the skin daily., Disp: 15 mL, Rfl: 3 .  losartan (COZAAR) 50 MG tablet, Take 50 mg by mouth daily. , Disp: , Rfl:  .  metFORMIN (GLUCOPHAGE-XR) 500 MG 24 hr tablet, TAKE 1 TABLET BY MOUTH TWICE DAILY, Disp: 60 tablet, Rfl: 11 .  Multiple Vitamins-Minerals (PRESERVISION AREDS PO), Take 1 capsule by mouth 2 (two) times daily. , Disp: , Rfl:  .  nitroGLYCERIN (NITROSTAT) 0.4 MG SL tablet, 1 tab under the tongue for CP, may repeat in 5 minutes up to 3 doses, Disp: 25 tablet, Rfl: 5 .  rosuvastatin (CRESTOR) 5 MG tablet, Take 5 mg by mouth every other day. Mon, Wed, Fri and Sat., Disp: , Rfl:  .  B-D ULTRAFINE III SHORT PEN 31G X 8 MM MISC, USE AS DIRECTED, Disp: 100 each, Rfl: 3 .  feeding supplement, GLUCERNA SHAKE, (GLUCERNA SHAKE) LIQD, Take 237 mLs by mouth 3 (three) times daily between meals., Disp: 21330 mL, Rfl: 0 .  pantoprazole (PROTONIX) 40 MG tablet, Take 1 tablet (40 mg total) by mouth daily. (Patient not taking: Reported on 02/18/2020), Disp: 30 tablet, Rfl: 0 . insulin aspart  0-9 Units Subcutaneous Q4H  . ipratropium-albuterol  3 mL Nebulization Q4H     Physical Exam: Blood pressure (!) 143/51, pulse 82, temperature 97.7 F (36.5 C), temperature source Axillary, resp. rate (!) 26, height 5' (1.524 m), weight 53.1 kg, SpO2 95 %.    Affect appropriate Elderly white female  HEENT: normal Neck supple with no adenopathy JVP normal lout right carotid  bruits no thyromegaly Lungs  Rhonchi and bilateral exp wheezing and good diaphragmatic motion Heart:  S1/S2 no murmur, no rub, gallop or click PMI normal Abdomen: benighn, BS positve, no tenderness, no AAA no bruit.  No HSM or HJR Post left fem surgery  No edema Neuro non-focal Skin warm and dry No muscular weakness   Labs:   Lab Results  Component Value Date   WBC 17.2 (H) 02/18/2020   HGB 10.2 (L) 02/18/2020   HCT 31.7 (L) 02/18/2020   MCV 91.4 02/18/2020   PLT 299 02/18/2020  Recent Labs  Lab  02/18/20 0758  NA 131*  K 4.7  CL 100  CO2 21*  BUN 33*  CREATININE 1.12*  CALCIUM 8.6*  GLUCOSE 268*   Lab Results  Component Value Date   CKTOTAL 63 05/03/2013   CKMB 1.0 05/03/2013   TROPONINI <0.03 11/02/2018    Lab Results  Component Value Date   CHOL 139 12/26/2018   CHOL 155 02/16/2017   CHOL 181 01/16/2016   Lab Results  Component Value Date   HDL 47 12/26/2018   HDL 48 02/16/2017   HDL 63 01/16/2016   Lab Results  Component Value Date   LDLCALC 57 12/26/2018   LDLCALC 79 02/16/2017   LDLCALC 95 01/16/2016   Lab Results  Component Value Date   TRIG 176 (H) 12/26/2018   TRIG 139 02/16/2017   TRIG 115 01/16/2016   Lab Results  Component Value Date   CHOLHDL 3.0 12/26/2018   CHOLHDL 3.2 02/16/2017   CHOLHDL 2.9 01/16/2016   No results found for: LDLDIRECT    Radiology: DG Chest Portable 1 View  Result Date: 02/18/2020 CLINICAL DATA:  Initial evaluation for acute shortness of breath. EXAM: PORTABLE CHEST 1 VIEW COMPARISON:  Prior radiograph from 12/25/2019 FINDINGS: Transverse heart size stable, and remains within normal limits. Mediastinal silhouette normal. Aortic atherosclerosis. Lungs are hyperinflated with underlying emphysematous changes. Diffusely increased vascularity with interstitial prominence consistent with superimposed pulmonary interstitial edema. Associated small left pleural effusion. Hazy bibasilar opacities favored to reflect atelectasis and/or edema. Superimposed infection would be difficult to exclude, and could be considered in the correct clinical setting. No pneumothorax. No acute osseous finding. Thoracolumbar scoliosis noted. IMPRESSION: 1. Findings consistent with moderate diffuse pulmonary interstitial edema. Hazy bibasilar opacities, favored to reflect atelectasis and/or edema. Superimposed infection would be difficult to exclude, and could be considered in the correct clinical setting. 2. Associated small left pleural effusion. 3.  Aortic Atherosclerosis (ICD10-I70.0) and Emphysema (ICD10-J43.9). Electronically Signed   By: Jeannine Boga M.D.   On: 02/18/2020 06:03    EKG: SR rate 81 no acute changes    ASSESSMENT AND PLAN:   1. CHF:  CXR with CHF , elevated BNP.  Has exp wheezing with elevated WBC Antibiotics per primary service Echo to assess RV/LV function Lasix 20 mg iv bid. Further Rx based on echo  Given her known vascular disease will need an ischemic evaluation at some point If EF normal may be able to do with her [facility] cardiologist as outpatient If EF decreased may need right and left cath latter this week after pulmonary status improved. Would continue steroids and nebs Already on ARB Low troponin with no acute ECG changes and no chest pain argues against acute ischemic event Notes indicate cath in 1998 with 40% LM and 95% ramus   2. PVD:  Post left femoral endarterectomy and SFA stenting ABI's non compressible  no claudication f/u Dr [name]  3. HTN  Continue losartan and diuretic further Rx based on TTE and EF   4. HLD:  Continue statin target LDL < 70 based on vascular disease   Signed: Jenkins Rouge 02/18/2020, 9:36 AM

## 2020-02-18 NOTE — ED Notes (Signed)
DG at bedside. 

## 2020-02-18 NOTE — H&P (Addendum)
History and Physical    Jill Hudson WNU:[id number] DOB: 09-Aug-1935 DOA: 02/18/2020  Referring MD/NP/PA:   PCP: [name], MD   Patient coming from:  The patient is coming from home.  At baseline, pt is independent for most of ADL.        Chief Complaint: SOB   HPI: Jill Hudson is a 85 y.o. female with medical history significant of Hypertension, hyperlipidemia, diabetes mellitus, stroke, TIA, GERD, anxiety, PVD, CAD, myocardial infarction, former smoker, CKD stage III, GI bleeding, iron deficiency anemia, who presents with shortness of breath.  Patient states that her shortness breath started last night, which has been progressively worsening.  Per EMS, patient pt woke up with gasping for air, and rescue found pt's oxygen saturation at 79% on RA. Pt was given 125 mg of solumedrol and 1.5 duonebs, pt's oxygen saturation improved to 98% on NSR. At arrival to ED, pt still has acute respiratory distress.  BiPAP was started.  Patient states that she has dry cough, no fever or chills.  She had some chest discomfort earlier, which has resolved.  Currently patient does not have chest pain.  Patient states that she has mild intermittent loose stool bowel movement recently, but currently no nausea, vomiting, diarrhea or abdominal pain.  No symptoms of UTI or unilateral weakness.  ED Course: pt was found to have BNP 600, troponin 67 -->143, WBC 17.2, negative Covid PCR, lactic acid 2.5, sodium 128, renal function close to baseline, temperature 97.7, blood pressure 165/56, heart rate 76, RR 27, oxygen saturation 99% on BiPAP.  Chest x-ray showed interstitial pulmonary edema and obesity bilateral basilar opacity.  Patient is admitted to stepdown as inpatient.  Review of Systems:   General: no fevers, chills, has fatigue HEENT: no blurry vision, hearing changes or sore throat Respiratory: has dyspnea, coughing, no wheezing CV: no chest pain, no palpitations GI: no nausea, vomiting, abdominal  pain, has diarrhea, no constipation GU: no dysuria, burning on urination, increased urinary frequency, hematuria  Ext: has leg edema Neuro: no unilateral weakness, numbness, or tingling, no vision change or hearing loss Skin: no rash, no skin tear. MSK: No muscle spasm, no deformity, no limitation of range of movement in spin Heme: No easy bruising.  Travel history: No recent long distant travel.  Allergy:  Allergies  Allergen Reactions  . Saxagliptin Diarrhea  . Epinephrine Other (See Comments)    Patient does not remember what happens when she uses this  . Atorvastatin Other (See Comments)    Muscle aches  . Codeine Other (See Comments)    Upset stomach  . Ezetimibe Other (See Comments)    Myalgias(ZETIA)  . Limonene Rash    Patient does not recall this reaction  . Nitrofurantoin Rash and Other (See Comments)    Pruitus  . Sulfa Antibiotics Rash and Other (See Comments)    Sore mouth     Past Medical History:  Diagnosis Date  . Allergies   . Anxiety   . Arthritis    spine and shoulder  . Atherosclerosis of artery of extremity with rest pain ([place]) 10/12/2018  . Cancer ([place])    skin  . Diabetes mellitus without complication ([place])   . Heart murmur   . Hyperlipidemia   . Hypertension   . Macula lutea degeneration   . Mitral and aortic valve disease   . Myocardial infarction [facility])    may have had a "light" heart attack  . Occasional tremors   .  PAD (peripheral artery disease) ([place])   . Shingles    patient unaware but daughter confirms. it was a long time ago  . Stroke [facility]) 01/2017   may have had a slight stroke  . TIA (transient ischemic attack) 01/2017  . UTI (urinary tract infection)   . Vascular disease, peripheral [facility])     Past Surgical History:  Procedure Laterality Date  . CARDIAC CATHETERIZATION  1998   40% LM, 95% Ramus interm  . CATARACT EXTRACTION, BILATERAL    . COLONOSCOPY WITH PROPOFOL N/A 08/12/2018   Procedure: COLONOSCOPY WITH PROPOFOL;   Surgeon: Lucilla Lame, MD;  [facility] ENDOSCOPY;  Service: Endoscopy;  Laterality: N/A;  . ENDARTERECTOMY FEMORAL Left 10/12/2018   Procedure: ENDARTERECTOMY FEMORAL;  Surgeon: Katha Cabal, MD;  Location: [facility] ORS;  Service: Vascular;  Laterality: Left;  angioplasty and left SFA stent placement  . EYE SURGERY Bilateral    cataract extractions  . LOWER EXTREMITY ANGIOGRAPHY Left 08/23/2017   Procedure: Lower Extremity Angiography;  Surgeon: Algernon Huxley, MD;  Location: [place] CV LAB;  Service: Cardiovascular;  Laterality: Left;  . LOWER EXTREMITY ANGIOGRAPHY Left 07/26/2018   Procedure: LOWER EXTREMITY ANGIOGRAPHY;  Surgeon: Katha Cabal, MD;  Location: [place] CV LAB;  Service: Cardiovascular;  Laterality: Left;  . LOWER EXTREMITY ANGIOGRAPHY Left 09/16/2018   Procedure: LOWER EXTREMITY ANGIOGRAPHY;  Surgeon: Katha Cabal, MD;  Location: [place] CV LAB;  Service: Cardiovascular;  Laterality: Left;  . PTCA  08/2013   Left common iliac  . PTCA  12/2012   left ext iliac  . TUBAL LIGATION      Social History:  reports that she quit smoking about 41 years ago. Her smoking use included cigarettes. She has a 74.00 pack-year smoking history. She has never used smokeless tobacco. She reports that she does not drink alcohol or use drugs.  Family History:  Family History  Problem Relation Age of Onset  . Dementia Mother   . Diabetes Father      Prior to Admission medications   Medication Sig Start Date End Date Taking? Authorizing Provider  acetaminophen (TYLENOL) 650 MG CR tablet Take 1,300 mg by mouth every 8 (eight) hours as needed for pain.    [provider]  B-D ULTRAFINE III SHORT PEN 31G X 8 MM MISC USE AS DIRECTED 12/19/18   [name], MD  baclofen (LIORESAL) 10 MG tablet TAKE 1 TABLET(10 MG) BY MOUTH THREE TIMES DAILY Patient taking differently: Take 10 mg by mouth at bedtime.  02/09/18   [name], MD  chlorthalidone  (HYGROTON) 25 MG tablet Take by mouth. 01/03/20   [provider]  clopidogrel (PLAVIX) 75 MG tablet TAKE 1 TABLET BY MOUTH EVERY DAY 02/14/20   Schnier, Dolores Lory, MD  Cyanocobalamin (RA VITAMIN B-12 TR) 1000 MCG TBCR Take 1,000 mcg by mouth daily.     [provider]  diltiazem (CARTIA XT) 180 MG 24 hr capsule Take 180 mg by mouth 2 (two) times daily.     [provider]  feeding supplement, GLUCERNA SHAKE, (GLUCERNA SHAKE) LIQD Take 237 mLs by mouth 3 (three) times daily between meals. 12/28/19   Loletha Grayer, MD  ferrous sulfate 325 (65 FE) MG tablet Take 325 mg by mouth daily with breakfast.    [provider]  gabapentin (NEURONTIN) 100 MG capsule TAKE 1 TO 2 CAPSULES(100 TO 200 MG) BY MOUTH AT BEDTIME Patient taking differently: Take 100-200 mg by mouth  at bedtime.  08/30/19   [name], MD  insulin aspart (NOVOLOG FLEXPEN) 100 UNIT/ML FlexPen Inject 3 Units into the skin 3 (three) times daily with meals. At lunch Patient taking differently: Inject 2 Units into the skin daily. At breakfast 01/18/19   [name], MD  Insulin Glargine [facility]) 100 UNIT/ML SOPN Inject 0.12 mLs (12 Units total) into the skin daily. 06/27/19   [name], MD  losartan (COZAAR) 50 MG tablet Take 50 mg by mouth daily.  01/03/20   [provider]  metFORMIN (GLUCOPHAGE-XR) 500 MG 24 hr tablet TAKE 1 TABLET BY MOUTH TWICE DAILY 03/25/19   [name], MD  Multiple Vitamins-Minerals (PRESERVISION AREDS PO) Take 1 capsule by mouth 2 (two) times daily.     [provider]  nitroGLYCERIN (NITROSTAT) 0.4 MG SL tablet 1 tab under the tongue for CP, may repeat in 5 minutes up to 3 doses 09/07/18   [name], MD  pantoprazole (PROTONIX) 40 MG tablet Take 1 tablet (40 mg total) by mouth daily. 12/29/19   Loletha Grayer, MD  rosuvastatin (CRESTOR) 5 MG tablet Take 5 mg by mouth every other day.  03/02/18   [provider]     Physical Exam: Vitals:   02/18/20 0530 02/18/20 0600 02/18/20 0605 02/18/20 0630  BP: (!) 170/70 (!) 156/49  (!) 165/56  Pulse: 75 70  76  Resp: (!) 24 (!) 21  20  Temp:      TempSrc:      SpO2: 99% 98%  99%  Weight:   53.1 kg   Height:   5' (1.524 m)    General: Not in acute distress HEENT:       Eyes: PERRL, EOMI, no scleral icterus.       ENT: No discharge from the ears and nose, no pharynx injection, no tonsillar enlargement.        Neck: no JVD, but has positive Hepatojugular reflux, no bruit, no mass felt. Heme: No neck lymph node enlargement. Cardiac: S1/S2, RRR, No gallops or rubs. Respiratory:  No rales, wheezing, rhonchi or rubs. GI: Soft, nondistended, nontender, no rebound pain, no organomegaly, BS present. GU: No hematuria Ext: 1+ pitting leg edema bilaterally. 1+DP/PT pulse bilaterally. Musculoskeletal: No joint deformities, No joint redness or warmth, no limitation of ROM in spin. Skin: No rashes.  Neuro: Alert, oriented X3, cranial nerves II-XII grossly intact, moves all extremities normally. Psych: Patient is not psychotic, no suicidal or hemocidal ideation.  Labs on Admission: I have personally reviewed following labs and imaging studies  CBC: Recent Labs  Lab 02/18/20 0517  WBC 17.2*  NEUTROABS 13.3*  HGB 10.2*  HCT 31.7*  MCV 91.4  PLT 400   Basic Metabolic Panel: Recent Labs  Lab 02/18/20 0517  NA 128*  K 4.0  CL 97*  CO2 19*  GLUCOSE 253*  BUN 32*  CREATININE 1.21*  CALCIUM 8.6*   GFR: Estimated Creatinine Clearance: 25.3 mL/min (A) (by C-G formula based on SCr of 1.21 mg/dL (H)). Liver Function Tests: No results for input(s): AST, ALT, ALKPHOS, BILITOT, PROT, ALBUMIN in the last 168 hours. No results for input(s): LIPASE, AMYLASE in the last 168 hours. No results for input(s): AMMONIA in the last 168 hours. Coagulation Profile: No results for input(s): INR, PROTIME in the last 168 hours. Cardiac Enzymes: No results for  input(s): CKTOTAL, CKMB, CKMBINDEX, TROPONINI in the last 168 hours. BNP (last 3 results) No results for input(s): PROBNP  in the last 8760 hours. HbA1C: No results for input(s): HGBA1C in the last 72 hours. CBG: Recent Labs  Lab 02/18/20 0828  GLUCAP 254*   Lipid Profile: No results for input(s): CHOL, HDL, LDLCALC, TRIG, CHOLHDL, LDLDIRECT in the last 72 hours. Thyroid Function Tests: No results for input(s): TSH, T4TOTAL, FREET4, T3FREE, THYROIDAB in the last 72 hours. Anemia Panel: No results for input(s): VITAMINB12, FOLATE, FERRITIN, TIBC, IRON, RETICCTPCT in the last 72 hours. Urine analysis:    Component Value Date/Time   COLORURINE STRAW (A) 02/15/2017 1558   APPEARANCEUR CLEAR (A) 02/15/2017 1558   LABSPEC 1.006 02/15/2017 1558   PHURINE 7.0 02/15/2017 1558   GLUCOSEU NEGATIVE 02/15/2017 1558   HGBUR NEGATIVE 02/15/2017 1558   BILIRUBINUR neg 09/14/2019 1120   KETONESUR NEGATIVE 02/15/2017 1558   PROTEINUR Negative 09/14/2019 1120   PROTEINUR 30 (A) 02/15/2017 1558   UROBILINOGEN 0.2 09/14/2019 1120   NITRITE neg 09/14/2019 1120   NITRITE NEGATIVE 02/15/2017 1558   LEUKOCYTESUR Large (3+) (A) 09/14/2019 1120   Sepsis Labs: @LABRCNTIP (procalcitonin:4,lacticidven:4) ) Recent Results (from the past 240 hour(s))  Respiratory Panel by RT PCR (Flu A&B, Covid) - Nasopharyngeal Swab     Status: None   Collection Time: 02/18/20  5:17 AM   Specimen: Nasopharyngeal Swab  Result Value Ref Range Status   SARS Coronavirus 2 by RT PCR NEGATIVE NEGATIVE Final    Comment: (NOTE) SARS-CoV-2 target nucleic acids are NOT DETECTED. The SARS-CoV-2 RNA is generally detectable in upper respiratoy specimens during the acute phase of infection. The lowest concentration of SARS-CoV-2 viral copies this assay can detect is 131 copies/mL. A negative result does not preclude SARS-Cov-2 infection and should not be used as the sole basis for treatment or other patient management decisions.  A negative result may occur with  improper specimen collection/handling, submission of specimen other than nasopharyngeal swab, presence of viral mutation(s) within the areas targeted by this assay, and inadequate number of viral copies (<131 copies/mL). A negative result must be combined with clinical observations, patient history, and epidemiological information. The expected result is Negative. Fact Sheet for Patients:  PinkCheek.be Fact Sheet for Healthcare Providers:  GravelBags.it This test is not yet ap proved or cleared by the [place] FDA and  has been authorized for detection and/or diagnosis of SARS-CoV-2 by FDA under an Emergency Use Authorization (EUA). This EUA will remain  in effect (meaning this test can be used) for the duration of the COVID-19 declaration under Section 564(b)(1) of the Act, 21 U.S.C. section 360bbb-3(b)(1), unless the authorization is terminated or revoked sooner.    Influenza A by PCR NEGATIVE NEGATIVE Final   Influenza B by PCR NEGATIVE NEGATIVE Final    Comment: (NOTE) The Xpert Xpress SARS-CoV-2/FLU/RSV assay is intended as an aid in  the diagnosis of influenza from Nasopharyngeal swab specimens and  should not be used as a sole basis for treatment. Nasal washings and  aspirates are unacceptable for Xpert Xpress SARS-CoV-2/FLU/RSV  testing. Fact Sheet for Patients: PinkCheek.be Fact Sheet for Healthcare Providers: GravelBags.it This test is not yet approved or cleared by the [place] FDA and  has been authorized for detection and/or diagnosis of SARS-CoV-2 by  FDA under an Emergency Use Authorization (EUA). This EUA will remain  in effect (meaning this test can be used) for the duration of the  Covid-19 declaration under Section 564(b)(1) of the Act, 21  U.S.C. section 360bbb-3(b)(1), unless the authorization is   terminated or revoked. Performed at [facility], [address], [place], [place] [address]      Radiological Exams on Admission: DG Chest Portable 1 View  Result Date: 02/18/2020 CLINICAL DATA:  Initial evaluation for acute shortness of breath. EXAM: PORTABLE CHEST 1 VIEW COMPARISON:  Prior radiograph from 12/25/2019 FINDINGS: Transverse heart size stable, and remains within normal limits. Mediastinal silhouette normal. Aortic atherosclerosis. Lungs are hyperinflated with underlying emphysematous changes. Diffusely increased vascularity with interstitial prominence consistent with superimposed pulmonary interstitial edema. Associated small left pleural effusion. Hazy bibasilar opacities favored to reflect atelectasis and/or edema. Superimposed infection would be difficult to exclude, and could be considered in the correct clinical setting. No pneumothorax. No acute osseous finding. Thoracolumbar scoliosis noted. IMPRESSION: 1. Findings consistent with moderate diffuse pulmonary interstitial edema. Hazy bibasilar opacities, favored to reflect atelectasis and/or edema. Superimposed infection would be difficult to exclude, and could be considered in the correct clinical setting. 2. Associated small left pleural effusion. 3. Aortic Atherosclerosis (ICD10-I70.0) and Emphysema (ICD10-J43.9). Electronically Signed   By: Jeannine Boga M.D.   On: 02/18/2020 06:03     EKG: Independently reviewed.  Sinus rhythm, QTC 471, T wave flattening  Assessment/Plan Principal Problem:   Acute respiratory failure with hypoxia (HCC) Active Problems:   Essential hypertension   PVD (peripheral vascular disease) (HCC)   Type II diabetes mellitus with renal manifestations (HCC)   History of CVA (cerebrovascular accident)   CKD (chronic kidney disease), stage IIIa   Elevated troponin   Hyponatremia   Acute CHF (congestive heart failure) (HCC)   Elevated lactic acid level   Acute respiratory  failure with hypoxia due to possible acute CHF: Patient has 1+ leg edema, positive hepatojugular reflex, elevated BNP. Chest x-ray showed interstitial pulmonary edema, clinically consistent with acute CHF. Patient had 2D echo on 02/15/2017 which showed EF 60-65%. Patient may have acute diastolic CHF. Patient has leukocytosis and chest x-ray showed bibasilar hazy opacity, but patient does not have fever, procalcitonin less than 0.10, clinically does not seem to have pneumonia. Patient received 1 dose of Rocephin and azithromycin. At this moment, will hold antibiotics and observe patient closely for any signs of infection. Pt needs IV diuretics. She received 1 dose of Lasix 20 mg in ED. Since patient has hyponatremia sodium 128, I will hold off further diuretics. In the follow-up edema up to 8-hour.  -Will admit to SDU unit as inpatient - continue BiPAP -trend trop -2d echo -Daily weights -strict I/O's -will not have low salt diet due to hyponatremia -Fluid restriction -Obtain REDs Vest readin  Elevated troponin: trop 67 -->143. Likely due to demand ischemia secondary to acute CHF. Dr. Johnsie Cancel of card is consulted -Trend troponin - on plavix and crestor -Check FLP, A1c -Repeat EKG in morning -Follow-up 2D echo -  HTN:  -Continue home medications: [place], diltiaaem -hold Hygroton due to hyponatremia -hydralazine prn  Hx of stroke -pt is taking 37.5 mg of plavix (due to hx of GIB, pt is taking half dose - Crestor  PVD (peripheral vascular disease) ([place]): -on plavix and crestor  Type II diabetes mellitus with renal manifestations ([place]): Most recent A1c 6.5, well controled. Patient is taking Metformin, NovoLog, glargine insulin at home -will decrease glargine insulin dose from 12 to 8 units daily -SSI  CKD (chronic kidney disease), stage IIIa: stable -f/u by BMP  Elevated lactic acid level: lactic acid level 2.5. pt has leukocytosis, but no fever. Clinically does not seem to have  sepsis. Possibly due to hypoxia. -Trend lactic acid  level -no IVF due to acute CHF   Hyponatremia: Most likely due to poor oral intake, and use of diuretics . Pt has hypervolemic hyponatremia, ideally treatment should be placing central line and starting hypertonic saline IV with diuretics, but it is invasive. Her mental status is normal. Patient already received 1 dose of IV Lasix, will observe patient with fluid restriction for now. - Will check urine sodium, urine osmolality, serum osmolality. - Fluid restriction - IVF:  No IV due to acute CHF - f/u by BMP q8h - hold Hygroton    DVT ppx: SQ Lovenox Code Status: Full code Family Communication:  Yes, patient's daughter by phone Disposition Plan:  Anticipate discharge back to previous home environment Consults called:  Dr. Johnsie Cancel of card is consulted Admission status: SDU as inpt      Status is: Inpatient Remains inpatient appropriate because:IV treatments appropriate due to intensity of illness Dispo: The patient is from: Home              Anticipated d/c is to: Home              Anticipated d/c date is: 2 days              Patient currently is not medically stable to d/c.    Inpatient status:  # Patient requires inpatient status due to high intensity of service, high risk for further deterioration and high frequency of surveillance required.  I certify that at the point of admission it is my clinical judgment that the patient will require inpatient hospital care spanning beyond 2 midnights from the point of admission.  . This patient has multiple chronic comorbidities including Hypertension, hyperlipidemia, diabetes mellitus, stroke, TIA, GERD, anxiety, PVD, CAD, myocardial infarction, former smoker, CKD stage III, GI bleeding, iron deficiency anemia . Now patient has presenting with acute respiratory failure with hypoxia due to possible acute CHF . The worrisome physical exam findings include 1+ bilateral lower leg edema,  positive hepatojugular reflux . The initial radiographic and laboratory data are worrisome because of elevated BNP, elevated troponin, pulmonary interstitial edema on chest x-ray . Current medical needs: please see my assessment and plan . Predictability of an adverse outcome (risk): Patient has multiple comorbidities as listed above. Now presents with acute respiratory failure with hypoxia due to possible acute CHF. Patient's presentation is highly complicated.  Patient is at high risk of deteriorating.  Will need to be treated in hospital for at least 2 days.                Date of Service 02/18/2020    [place] [facility]   If 7PM-7AM, please contact night-coverage [email or web address] 02/18/2020, 8:30 AM

## 2020-02-19 ENCOUNTER — Inpatient Hospital Stay (HOSPITAL_COMMUNITY)
Admit: 2020-02-19 | Discharge: 2020-02-19 | Disposition: A | Discharge status: Home / Self Care | Payer: Medicare Other | Attending: Internal Medicine | Admitting: Internal Medicine

## 2020-02-19 DIAGNOSIS — I5031 Acute diastolic (congestive) heart failure: Secondary | ICD-10-CM

## 2020-02-19 DIAGNOSIS — E1121 Type 2 diabetes mellitus with diabetic nephropathy: Secondary | ICD-10-CM

## 2020-02-19 DIAGNOSIS — I214 Non-ST elevation (NSTEMI) myocardial infarction: Secondary | ICD-10-CM

## 2020-02-19 LAB — LIPID PANEL
Cholesterol: 117 mg/dL (ref 0–200)
HDL: 56 mg/dL (ref >40)
LDL Cholesterol: 54 mg/dL (ref 0–99)
Total CHOL/HDL Ratio: 2.1 RATIO
Triglycerides: 34 mg/dL (ref <150)
VLDL: 7 mg/dL (ref 0–40)

## 2020-02-19 LAB — GLUCOSE, CAPILLARY
Glucose-Capillary: 193 mg/dL — ABNORMAL HIGH (ref 70–99)
Glucose-Capillary: 197 mg/dL — ABNORMAL HIGH (ref 70–99)
Glucose-Capillary: 282 mg/dL — ABNORMAL HIGH (ref 70–99)
Glucose-Capillary: 292 mg/dL — ABNORMAL HIGH (ref 70–99)
Glucose-Capillary: 324 mg/dL — ABNORMAL HIGH (ref 70–99)
Glucose-Capillary: 352 mg/dL — ABNORMAL HIGH (ref 70–99)

## 2020-02-19 LAB — BASIC METABOLIC PANEL
Anion gap: 11 (ref 5–15)
BUN: 57 mg/dL — ABNORMAL HIGH (ref 8–23)
CO2: 20 mmol/L — ABNORMAL LOW (ref 22–32)
Calcium: 9 mg/dL (ref 8.9–10.3)
Chloride: 98 mmol/L (ref 98–111)
Creatinine, Ser: 1.85 mg/dL — ABNORMAL HIGH (ref 0.44–1.00)
GFR calc Af Amer: 29 mL/min — ABNORMAL LOW (ref >60)
GFR calc non Af Amer: 25 mL/min — ABNORMAL LOW (ref >60)
Glucose, Bld: 206 mg/dL — ABNORMAL HIGH (ref 70–99)
Potassium: 5.1 mmol/L (ref 3.5–5.1)
Sodium: 129 mmol/L — ABNORMAL LOW (ref 135–145)

## 2020-02-19 LAB — MAGNESIUM: Magnesium: 2.1 mg/dL (ref 1.7–2.4)

## 2020-02-19 LAB — CBC
HCT: 28 % — ABNORMAL LOW (ref 36.0–46.0)
Hemoglobin: 9.3 g/dL — ABNORMAL LOW (ref 12.0–15.0)
MCH: 29.9 pg (ref 26.0–34.0)
MCHC: 33.2 g/dL (ref 30.0–36.0)
MCV: 90 fL (ref 80.0–100.0)
Platelets: 268 10*3/uL (ref 150–400)
RBC: 3.11 MIL/uL — ABNORMAL LOW (ref 3.87–5.11)
RDW: 13.4 % (ref 11.5–15.5)
WBC: 15.3 10*3/uL — ABNORMAL HIGH (ref 4.0–10.5)
nRBC: 0 % (ref 0.0–0.2)

## 2020-02-19 LAB — ECHOCARDIOGRAM COMPLETE
Height: 60 in
Weight: 1940.8 oz

## 2020-02-19 LAB — URINE CULTURE: Culture: NO GROWTH

## 2020-02-19 LAB — TROPONIN I (HIGH SENSITIVITY): Troponin I (High Sensitivity): 3082 ng/L (ref <18)

## 2020-02-19 MED ORDER — METHYLPREDNISOLONE SODIUM SUCC 40 MG IJ SOLR
40.0000 mg | Freq: Two times a day (BID) | INTRAMUSCULAR | Status: DC
  Administered 2020-02-19 – 2020-02-20 (×3): 40 mg via INTRAVENOUS
  Filled 2020-02-19 (×3): qty 1

## 2020-02-19 MED ORDER — ALBUTEROL SULFATE (2.5 MG/3ML) 0.083% IN NEBU
2.5000 mg | INHALATION_SOLUTION | RESPIRATORY_TRACT | Status: DC
  Administered 2020-02-19 – 2020-02-20 (×6): 2.5 mg via RESPIRATORY_TRACT
  Filled 2020-02-19 (×6): qty 3

## 2020-02-19 MED ORDER — SODIUM CHLORIDE 0.9 % IV BOLUS
500.0000 mL | Freq: Once | INTRAVENOUS | Status: AC
  Administered 2020-02-19: 500 mL via INTRAVENOUS

## 2020-02-19 MED ORDER — ALBUTEROL SULFATE (2.5 MG/3ML) 0.083% IN NEBU: 2.5000 mg | INHALATION_SOLUTION | RESPIRATORY_TRACT | Status: DC | PRN

## 2020-02-19 MED ORDER — TIOTROPIUM BROMIDE MONOHYDRATE 18 MCG IN CAPS
18.0000 ug | ORAL_CAPSULE | Freq: Every day | RESPIRATORY_TRACT | Status: DC
  Administered 2020-02-19 – 2020-02-20 (×2): 18 ug via RESPIRATORY_TRACT
  Filled 2020-02-19: qty 5

## 2020-02-19 MED ORDER — SODIUM CHLORIDE 0.9 % IV SOLN
100.0000 mg | Freq: Two times a day (BID) | INTRAVENOUS | Status: DC
  Administered 2020-02-19 – 2020-02-20 (×3): 100 mg via INTRAVENOUS
  Filled 2020-02-19 (×4): qty 100

## 2020-02-19 MED ORDER — CLONAZEPAM 0.125 MG PO TBDP
0.1250 mg | ORAL_TABLET | Freq: Two times a day (BID) | ORAL | Status: DC | PRN
  Administered 2020-02-20 – 2020-02-21 (×3): 0.125 mg via ORAL
  Filled 2020-02-19 (×4): qty 1

## 2020-02-19 MED ORDER — CLONAZEPAM 0.125 MG PO TBDP
0.1250 mg | ORAL_TABLET | Freq: Two times a day (BID) | ORAL | Status: DC | PRN
  Filled 2020-02-19 (×2): qty 1

## 2020-02-19 NOTE — Progress Notes (Signed)
Patient called out-   Having episode of SOB.  Assessed patient.  Patient sitting on Bedside commode RR slightly elevated, O2 sat- 95%;  Rhonchi heard in lungs.  Patient is also feeling very anxious.   Waiting on pharmacy to send Jill Hudson.

## 2020-02-19 NOTE — Progress Notes (Addendum)
Progress Note  Patient Name: Jill Hudson Date of Encounter: 02/19/2020  Primary Cardiologist:[facility], Dr. Fletcher Anon rounding  Subjective   She continues to be SOB on 2-3L  oxygen. She notes dizziness both at rest and with ambulation.   She has not fallen since admission.  She reports a recent fall today, occurring last Thursday, during which she hit her head. She stated today that this occurred a 1-2 days before her shortness of breath. On exam, head shows nodule on the top left scalp. She reports dizziness but denies any neurologic sx. She reports imaging since then with CT from 3/13 and further recommendations as below.   We went over her echo results in detail.  Inpatient Medications    Scheduled Meds: . albuterol  2.5 mg Nebulization Q4H  . clopidogrel  37.5 mg Oral Daily  . diltiazem  180 mg Oral BID  . enoxaparin (LOVENOX) injection  30 mg Subcutaneous Q24H  . feeding supplement (GLUCERNA SHAKE)  237 mL Oral TID BM  . ferrous sulfate  325 mg Oral Q breakfast  . gabapentin  100-200 mg Oral QHS  . insulin aspart  0-9 Units Subcutaneous Q4H  . insulin glargine  8 Units Subcutaneous Daily  . melatonin  2.5 mg Oral QHS  . methylPREDNISolone (SOLU-MEDROL) injection  40 mg Intravenous Q12H  . multivitamin-lutein  1 capsule Oral BID  . rosuvastatin  5 mg Oral Once per day on Mon Wed Fri Sat  . sodium chloride flush  3 mL Intravenous Q12H  . tiotropium  18 mcg Inhalation Daily  . vitamin B-12  1,000 mcg Oral Daily   Continuous Infusions: . sodium chloride    . doxycycline (VIBRAMYCIN) IV 100 mg (02/19/20 1129)   PRN Meds: sodium chloride, acetaminophen, albuterol, baclofen, clonazepam, dextromethorphan-guaiFENesin, hydrALAZINE, nitroGLYCERIN, ondansetron (ZOFRAN) IV, sodium chloride flush   Vital Signs    Vitals:   02/19/20 0142 02/19/20 0830 02/19/20 1249 02/19/20 1417  BP: (!) 109/52 (!) 128/96 99/88   Pulse: 95 94 85   Resp: 20 19 18    Temp: 98 F (36.7 C) 98.1 F  (36.7 C) 98.3 F (36.8 C)   TempSrc:  Oral Oral   SpO2: 93% 99% 92% 95%  Weight:      Height:        Intake/Output Summary (Last 24 hours) at 02/19/2020 1421 Last data filed at 02/19/2020 0943 Gross per 24 hour  Intake 240 ml  Output 100 ml  Net 140 ml   Last 3 Weights 02/19/2020 02/18/2020 02/18/2020  Weight (lbs) 121 lb 4.8 oz 120 lb 12.8 oz 117 lb  Weight (kg) 55.021 kg 54.795 kg 53.071 kg      Telemetry     - Personally Reviewed  ECG    No new tracings - Personally Reviewed  Physical Exam   GEN: No acute distress.   Neck: Unable to assess JVD with patient seated on toilet Cardiac: RRR, 2/6  systolic murmur, rubs, or gallops.  Respiratory: Clear to auscultation bilaterally. GI: Soft, nontender, non-distended  MS: No edema; No deformity. Neuro:  Nonfocal  Psych: Normal affect   Labs    High Sensitivity Troponin:   Recent Labs  Lab 02/18/20 0517 02/18/20 0758 02/18/20 1051 02/18/20 1431  TROPONINIHS 67* 143* 193* 202*      Chemistry Recent Labs  Lab 02/18/20 1431 02/18/20 2302 02/19/20 0506  NA 131* 129* 129*  K 4.6 4.7 5.1  CL 99 97* 98  CO2 19* 18* 20*  GLUCOSE  321* 286* 206*  BUN 35* 49* 57*  CREATININE 1.49* 1.68* 1.85*  CALCIUM 8.9 9.1 9.0  GFRNONAA 32* 28* 25*  GFRAA 37* 32* 29*  ANIONGAP 13 14 11      Hematology Recent Labs  Lab 02/18/20 0517 02/19/20 0506  WBC 17.2* 15.3*  RBC 3.47* 3.11*  HGB 10.2* 9.3*  HCT 31.7* 28.0*  MCV 91.4 90.0  MCH 29.4 29.9  MCHC 32.2 33.2  RDW 13.2 13.4  PLT 299 268    BNP Recent Labs  Lab 02/18/20 0517  BNP 600.0*     DDimer No results for input(s): DDIMER in the last 168 hours.   Radiology    DG Chest Portable 1 View  Result Date: 02/18/2020 CLINICAL DATA:  Initial evaluation for acute shortness of breath. EXAM: PORTABLE CHEST 1 VIEW COMPARISON:  Prior radiograph from 12/25/2019 FINDINGS: Transverse heart size stable, and remains within normal limits. Mediastinal silhouette normal.  Aortic atherosclerosis. Lungs are hyperinflated with underlying emphysematous changes. Diffusely increased vascularity with interstitial prominence consistent with superimposed pulmonary interstitial edema. Associated small left pleural effusion. Hazy bibasilar opacities favored to reflect atelectasis and/or edema. Superimposed infection would be difficult to exclude, and could be considered in the correct clinical setting. No pneumothorax. No acute osseous finding. Thoracolumbar scoliosis noted. IMPRESSION: 1. Findings consistent with moderate diffuse pulmonary interstitial edema. Hazy bibasilar opacities, favored to reflect atelectasis and/or edema. Superimposed infection would be difficult to exclude, and could be considered in the correct clinical setting. 2. Associated small left pleural effusion. 3. Aortic Atherosclerosis (ICD10-I70.0) and Emphysema (ICD10-J43.9). Electronically Signed   By: Jeannine Boga M.D.   On: 02/18/2020 06:03   ECHOCARDIOGRAM COMPLETE  Result Date: 02/19/2020    ECHOCARDIOGRAM REPORT   Patient Name:   Jill Hudson Date of Exam: 02/19/2020 Medical Rec #:  [id number]     Height:       60.0 in Accession #:    [id number]    Weight:       121.3 lb Date of Birth:  [birth year]      BSA:          1.509 m Patient Age:    85 years      BP:           128/96 mmHg Patient Gender: F             HR:           94 bpm. Exam Location:  [facility] Procedure: 2D Echo, Cardiac Doppler and Color Doppler Indications:     CHF-acute diastolic 470.96  History:         Patient has prior history of Echocardiogram examinations, most                  recent 02/15/2017. TIA and Stroke; Risk Factors:Diabetes. MI,                  mitral and AOV disease.  Sonographer:     Sherrie Sport RDCS (AE) Referring Phys:  [id number] Soledad Gerlach NIU Diagnosing Phys: Kathlyn Sacramento MD IMPRESSIONS  1. Left ventricular ejection fraction, by estimation, is 55 to 60%. The left ventricle has normal function. The left ventricle has no regional wall  motion abnormalities. There is mild left ventricular hypertrophy. Left ventricular diastolic parameters are consistent with Grade II diastolic dysfunction (pseudonormalization).  2. Right ventricular systolic function is normal. The right ventricular size is normal. There is moderately elevated pulmonary artery systolic pressure.  3. Left atrial size was mildly  dilated.  4. The mitral valve is myxomatous. Moderate mitral valve regurgitation. No evidence of mitral stenosis.  5. The aortic valve is normal in structure. Aortic valve regurgitation is not visualized. Mild aortic valve sclerosis is present, with no evidence of aortic valve stenosis.  6. The inferior vena cava is dilated in size with >50% respiratory variability, suggesting right atrial pressure of 8 mmHg. FINDINGS  Left Ventricle: Left ventricular ejection fraction, by estimation, is 55 to 60%. The left ventricle has normal function. The left ventricle has no regional wall motion abnormalities. The left ventricular internal cavity size was normal in size. There is  mild left ventricular hypertrophy. Left ventricular diastolic parameters are consistent with Grade II diastolic dysfunction (pseudonormalization). Right Ventricle: The right ventricular size is normal. No increase in right ventricular wall thickness. Right ventricular systolic function is normal. There is moderately elevated pulmonary artery systolic pressure. The tricuspid regurgitant velocity is 3.14 m/s, and with an assumed right atrial pressure of 10 mmHg, the estimated right ventricular systolic pressure is 15.4 mmHg. Left Atrium: Left atrial size was mildly dilated. Right Atrium: Right atrial size was normal in size. Pericardium: There is no evidence of pericardial effusion. 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 There is moderate thickening of the mitral valve leaflet(s). There is mild calcification of the mitral valve leaflet(s). Normal mobility of the mitral valve leaflets.  Moderate mitral valve regurgitation, with  eccentric posteriorly directed jet. No evidence of mitral valve stenosis. Tricuspid Valve: The tricuspid valve is normal in structure. Tricuspid valve regurgitation is trivial. No evidence of tricuspid stenosis. Aortic Valve: The aortic valve is normal in structure. Aortic valve regurgitation is not visualized. Mild aortic valve sclerosis is present, with no evidence of aortic valve stenosis. Aortic valve mean gradient measures 3.0 mmHg. Aortic valve peak gradient measures 4.8 mmHg. Aortic valve area, by VTI measures 2.06 cm. Pulmonic Valve: The pulmonic valve was normal in structure. Pulmonic valve regurgitation is not visualized. No evidence of pulmonic stenosis. Aorta: The aortic root is normal in size and structure. Venous: The inferior vena cava is dilated in size with greater than 50% respiratory variability, suggesting right atrial pressure of 8 mmHg. IAS/Shunts: No atrial level shunt detected by color flow Doppler.  LEFT VENTRICLE PLAX 2D LVIDd:         4.40 cm  Diastology LVIDs:         2.99 cm  LV e' lateral:   5.87 cm/s LV PW:         0.91 cm  LV E/e' lateral: 23.0 LV IVS:        0.88 cm  LV e' medial:    8.27 cm/s LVOT diam:     2.00 cm  LV E/e' medial:  16.3 LV SV:         48 LV SV Index:   32 LVOT Area:     3.14 cm  RIGHT VENTRICLE RV Basal diam:  3.46 cm LEFT ATRIUM             Index       RIGHT ATRIUM           Index LA diam:        3.90 cm 2.58 cm/m  RA Area:     13.10 cm LA Vol (A2C):   64.2 ml 42.54 ml/m RA Volume:   31.50 ml  20.87 ml/m LA Vol (A4C):   56.1 ml 37.17 ml/m LA Biplane Vol: 62.2 ml 41.21 ml/m  AORTIC VALVE  PULMONIC VALVE AV Area (Vmax):    1.80 cm    PV Vmax:        0.90 m/s AV Area (Vmean):   1.53 cm    PV Peak grad:   3.2 mmHg AV Area (VTI):     2.06 cm    RVOT Peak grad: 3 mmHg AV Vmax:           109.50 cm/s AV Vmean:          86.000 cm/s AV VTI:            0.232 m AV Peak Grad:      4.8 mmHg AV Mean Grad:       3.0 mmHg LVOT Vmax:         62.70 cm/s LVOT Vmean:        41.800 cm/s LVOT VTI:          0.152 m LVOT/AV VTI ratio: 0.66  AORTA Ao Root diam: 2.50 cm MITRAL VALVE                TRICUSPID VALVE MV Area (PHT): 4.96 cm     TR Peak grad:   39.4 mmHg MV Decel Time: 153 msec     TR Vmax:        314.00 cm/s MV E velocity: 135.00 cm/s MV A velocity: 124.00 cm/s  SHUNTS MV E/A ratio:  1.09         Systemic VTI:  0.15 m                             Systemic Diam: 2.00 cm Kathlyn Sacramento MD Electronically signed by Kathlyn Sacramento MD Signature Date/Time: 02/19/2020/10:12:40 AM    Final     Cardiac Studies   Echo 02/19/2020 1. Left ventricular ejection fraction, by estimation, is 55 to 60%. The  left ventricle has normal function. The left ventricle has no regional  wall motion abnormalities. There is mild left ventricular hypertrophy.  Left ventricular diastolic parameters  are consistent with Grade II diastolic dysfunction (pseudonormalization).  2. Right ventricular systolic function is normal. The right ventricular  size is normal. There is moderately elevated pulmonary artery systolic  pressure.  3. Left atrial size was mildly dilated.  4. The mitral valve is myxomatous. Moderate mitral valve regurgitation.  No evidence of mitral stenosis.  5. The aortic valve is normal in structure. Aortic valve regurgitation is  not visualized. Mild aortic valve sclerosis is present, with no evidence  of aortic valve stenosis.  6. The inferior vena cava is dilated in size with >50% respiratory  variability, suggesting right atrial pressure of 8 mmHg.   Patient Profile     85 y.o. female with a history of HFpEF, pulmonary HTN, moderate MR, PVD with bilateral carotid stenosis and previous LLE vascular surgery, HTN, DM2, history of CVA, CKD, prior h/o smoking, and seen today for acute HFpEF exacerbation.   Assessment & Plan    HFpEF Pulmonary HTN --Reports ongoing SOB on [place] oxygen today. Most recent  oxygen saturations are stable. Consider multifactorial etiology of SOB given current anemia 10.2  9.3 (reports melena, see below), leukocytosis, pulmonary HTn, MR, and long history of smoking. Also considered is PE given recent fall and hypoxia, though most recent echo as above without significant evidence of right heart strain. Could consider V/Q scan given hypoxia (not normally on oxygen) and recent elevated rates with recent fall to r/o PE. Continue abx,  steroids, nebs.  --Most recent echo as above on 4/26 with EF nl and G2DD, moderately elevated PASP, moderate MR, and RAP 25mmHg. EMS sats 79%. BNP at presentation 600. COVID-19 negative. CXR with pulmonary edema.  --Per review of EMR, previous cath in 1998 with 40% LM and 95% ramus. Would benefit from outpatient ischemic workup at some point. Most recent EKG without acute ST/T changes. Echo without WMA or reduced EF. HS Tn trending from 67  202. --Euvolemic on exam with current hypotension. Bump in renal function after received IV lasix 20mg . Now receiving NS / IVF. Holding diuresis at this time given AKI.  --Recommend continue to monitor renal function and volume status. Caution with antiplatelets given most recent CBC as below with melena and recent fall.  --Will need follow-up in the office.   Hypotension --Continue to monitor. Avoid AKI. Recommend holding antihypertensive as needed and caution with diuresis to prevent AKI.   History of CAD --No CP. EKG without acute changes. HS Tn 67  202. Continue to cycle until peaked and down-trending. Most recent echo without significant reduction in EF or WMA. On DAPT. Recent fall / report of melena with CBC showing anemia.  --Recommend further workup of anemia as mentioned below. As above, recommend ischemic evaluation in the near future.   Moderate MR --Continue to monitor with periodic echo.  Melena --She reports a history of melena. Daily CBC recommended given low Hgb 10.2  9.3 with recent fall and  melena. Some confusion on exam as well. She is on antiplatelet therapy, so will want to r/o GIB before discharge.  Recent fall Scalp contusion / Nodule --States this occurred last Thursday and right before her onset of SOB. Current scalp nodule / contusion on top left of head. States she had imaging; however, on review of EMR, it appears that this imaging was performed 3/13, unless this is a repeat fall. --Consider repeat CT of head.  As previously noted, also considered is further workup of anemia.  HTN --Soft BP to hypotensive. Titrate antihypertensives as needed. Reports that sometimes her BP elevates at home to SBP 190s.   PVD --F/u per vascular surgery. H/o L femoral endarterectomy and SFA stenting. Continue statin and ASA.   HLD --Continue statin with LDL <70.   CKD --Current Cr 1.85 with BUN 57. Consider nephrology consult. Caution with abx that are renally excreted.   Hyponatremia, Hyperkalemia --Continue to monitor closely.   Anemia --Continue to monitor given report of melena and fall. Caution with Hgb below 8.5. Recommend further workup.  For questions or updates, please contact [place] Please consult [email or web address] for contact info under        Signed, Arvil Chaco, PA-C  02/19/2020, 2:21 PM

## 2020-02-19 NOTE — Progress Notes (Signed)
CRITICAL VALUE ALERT  Critical Value:  Troponin 3082 Date & Time Notied:  02/19/2020  - 1720  Provider Notified: cardiology MD notified

## 2020-02-19 NOTE — Progress Notes (Signed)
SATURATION QUALIFICATIONS: (This note is used to comply with regulatory documentation for home oxygen)  Patient Saturations on Room Air at Rest = 91 %  Patient Saturations on Room Air while Ambulating = 86%  Patient Saturations on 2 Liters of oxygen while Ambulating = 91%

## 2020-02-19 NOTE — Progress Notes (Signed)
Initial Nutrition Assessment  DOCUMENTATION CODES:   Not applicable  INTERVENTION:   Glucerna Shake po TID, each supplement provides 220 kcal and 10 grams of protein  NUTRITION DIAGNOSIS:   Increased nutrient needs related to chronic illness(COPD) as evidenced by increased estimated needs.  GOAL:   Patient will meet greater than or equal to 90% of their needs  MONITOR:   PO intake, Supplement acceptance, Labs, Weight trends, Skin, I & O's  REASON FOR ASSESSMENT:   Malnutrition Screening Tool    ASSESSMENT:   85 y.o. female past medical history significant for hypertension, uncontrolled diabetes mellitus type 2 last hemoglobin A1c of 6.3, chronic kidney disease stage III and creatinine of less than 1, history of's CVA/TIA peripheral vascular disease and coronary artery disease, MI, tobacco use, GI bleed, iron deficiency anemia comes into the hospital for shortness of breath   Pt unavailable at time of RD visit today. Pt is known to this RD from previous admits. Pt with fairly good appetite and oral intake at baseline. Pt enjoys chocolate Glucerna and has been drinking this in hospital. Recommend continue supplements. Can consider liberalizing pt's diet if oral intake is not good. Per chart, pt is weight stable at baseline. RD will obtain nutrition related history and exam at follow-up.   Medications reviewed and include: plavix, lovenox, ferrous sulfate, insulin, melatonin, solu-medrol, ocuvite, B12, doxycycline   Labs reviewed: Na 129(L), BUN 57(H), creat 1.85(H), Mg 2.1 wnl Wbc- 15.3(H), Hgb 9.3(L), Hct 28.0(L) cbgs- 193, 197, 282 x 24 hrs  NUTRITION - FOCUSED PHYSICAL EXAM: Unable to perform at this time   Diet Order:   Diet Order            Diet heart healthy/carb modified Room service appropriate? Yes; Fluid consistency: Thin; Fluid restriction: 1200 mL Fluid  Diet effective now             EDUCATION NEEDS:   Not appropriate for education at this time  Skin:   Skin Assessment: Reviewed RN Assessment(ecchymosis)  Last BM:  4/25  Height:   Ht Readings from Last 1 Encounters:  02/18/20 5' (1.524 m)    Weight:   Wt Readings from Last 1 Encounters:  02/19/20 55 kg    Ideal Body Weight:  45.4 kg  BMI:  Body mass index is 23.69 kg/m.  Estimated Nutritional Needs:   Kcal:  1300-1500kcal/day  Protein:  65-75g/day  Fluid:  >1.2L/day  Koleen Distance MS, RD, LDN Please refer to [facility] for RD and/or RD on-call/weekend/after hours pager

## 2020-02-19 NOTE — Progress Notes (Addendum)
Inpatient Diabetes Program Recommendations  AACE/ADA: New Consensus Statement on Inpatient Glycemic Control (2015)  Target Ranges:  Prepandial:   less than 140 mg/dL      Peak postprandial:   less than 180 mg/dL (1-2 hours)      Critically ill patients:  140 - 180 mg/dL   Results for Jill Hudson, Jill Hudson (MRN [id number]) as of 02/19/2020 13:06  Ref. Range 02/18/2020 08:28 02/18/2020 11:21 02/18/2020 13:18 02/18/2020 16:26 02/18/2020 20:48  Glucose-Capillary Latest Ref Range: 70 - 99 mg/dL 254 (H) 276 (H) 266 (H) 278 (H) 330 (H)   Results for Jill Hudson, Jill Hudson (MRN [id number]) as of 02/19/2020 13:06  Ref. Range 02/18/2020 23:52 02/19/2020 04:19 02/19/2020 08:32 02/19/2020 12:51  Glucose-Capillary Latest Ref Range: 70 - 99 mg/dL 248 (H) 193 (H) 197 (H) 282 (H)    Home DM Meds: Basaglar 12 units Daily       Novolog 2 units Daily in the AM       Metformin 500 mg BID   Current Orders: Lantus 8 units Daily      Novolog Sensitive Correction Scale/ SSI (0-9 units) Q4 hours     MD- Note Solumedrol 40 mg BID started this AM.  Expect CBGs to rise further with addition of steroids.  Please consider the following:  1. Increase Lantus to 12 units daily (home dose)  2. Increase Novolog SSI to the Moderate scale (0-15 units) Q4 hours    --Will follow patient during hospitalization--  Wyn Quaker RN, MSN, CDE Diabetes Coordinator Inpatient Glycemic Control Team Team Pager: [phone] (8a-5p)

## 2020-02-19 NOTE — Progress Notes (Signed)
*  PRELIMINARY RESULTS* Echocardiogram 2D Echocardiogram has been performed.  Jill Hudson 02/19/2020, 8:47 AM

## 2020-02-19 NOTE — Progress Notes (Addendum)
[facility] PROGRESS NOTE    Progress Note  Jill Hudson  WPV:[id number] DOB: 05-29-1935 DOA: 02/18/2020 PCP: [name], MD     Brief Narrative:   Jill Hudson is an 85 y.o. female past medical history significant for hypertension, uncontrolled diabetes mellitus type 2 last hemoglobin A1c of 6.3, chronic kidney disease stage III and creatinine of less than 1, history of's CVA/TIA peripheral vascular disease and coronary artery disease, MI, tobacco use, GI bleed, iron deficiency anemia comes into the hospital for shortness of breath, the patient relates it started the day prior to admission she called EMS and was found to be satting 79% on room air was given IV Solu-Medrol and inhalers saturations improved to 98% in sinus rhythm on telemetry.  In the ED she was placed on BiPAP, she was found to have troponins of 67, BNP of 600 white blood cell count of 17.5, procalcitonin low yield lactic acid of 2.5, influenza PCR and SARS-CoV-2 PCR were negative.  Chest x-ray showed bilateral interstitial infiltrates  Assessment/Plan:   Acute respiratory failure with hypoxia (HCC) due to acute COPD exacerbation: Chest x-ray showed bilateral infiltrates, negative JVD negative and hepatojugular reflux, no lower extremity edema, although her BNP was elevated at 600 she did get IV Lasix and her creatinine worsen she is wheezing on physical exam, CT angio of the chest shows definitely emphysema. Her estimated dry weight on admission is 55 kg, which is around the same as previous admissions and outpatient visits. I will stop the IV Lasix, start her on IV steroids, antibiotics, she does not use oxygen at home we will try to wean to room air. We will check orthostatics we will give her normal saline bolus 2D echo this morning is reassuring.  Elevated troponins: Likely due to demand ischemia, cardiology is being consulted recommended to continue Plavix and Crestor. They also recommended to continue to  trend cardiac biomarkers, the echo to assess RV and LV function further management based on results of echocardiogram which is pending at the time of this dictation.  Essential hypertension: Continue Cozaar and diltiazem. Hydralazine IV as needed for systolic blood pressure greater than 180.  History of stroke: Due to her history of GI bleed she is taking half a dose of Plavix, continue Crestor.  Peripheral vascular disease and coronary artery disease: Continue Plavix and Crestor.  Diabetes mellitus type 2 with renal complications: Her most recent A1c was 6.5, we are currently the holding oral hypoglycemic agents. She was started on long-acting insulin plus sliding scale.  Chronic kidney disease stage IIIa: Seems to be at baseline.  Elevated lactic acid: Of 2.5, with my leukocytosis but afebrile the patient does not have sepsis. This possibly due to hypoxia.  Mild hyponatremia: In the setting of poor oral intake and diuretic use question hypovolemic, she appears euvolemic on physical exam Did receive a dose of IV Lasix, over bolus of normal saline recheck a basic metabolic panel tomorrow morning.  Acute kidney injury: Prerenal in the setting of Lasix and ARB use started on IV fluids recheck basic metabolic panel in the morning likely prerenal azotemia.    DVT prophylaxis: lovenox Family Communication:none Status is: Inpatient  Remains inpatient appropriate because:Hemodynamically unstable   Dispo: The patient is from: Home              Anticipated d/c is to: Home              Anticipated d/c date is: 3 days  Patient currently is not medically stable to d/c.    Code Status:     Code Status Orders  (From admission, onward)         Start     Ordered   02/18/20 1100  Full code  Continuous     02/18/20 1059        Code Status History    Date Active Date Inactive Code Status Order ID Comments User Context   12/25/2019 0555 12/28/2019 2204 Full Code  [id number]  Christel Mormon, MD ED   10/12/2018 1647 10/15/2018 1808 Full Code [id number]  [name], Dolores Lory, MD Inpatient   09/16/2018 1254 09/16/2018 2048 Full Code [id number]  Katha Cabal, MD Inpatient   08/11/2018 1706 08/13/2018 2136 Full Code [id number]  Gorden Harms, MD Inpatient   07/26/2018 0921 07/26/2018 1652 Full Code [id number]  Katha Cabal, MD Inpatient   08/23/2017 0935 08/23/2017 1419 Full Code [id number]  Algernon Huxley, MD Inpatient   02/15/2017 1538 02/16/2017 1959 Full Code [id number]  Dustin Flock, MD Inpatient   Advance Care Planning Activity        IV Access:    Peripheral IV   Procedures and diagnostic studies:   DG Chest Portable 1 View  Result Date: 02/18/2020 CLINICAL DATA:  Initial evaluation for acute shortness of breath. EXAM: PORTABLE CHEST 1 VIEW COMPARISON:  Prior radiograph from 12/25/2019 FINDINGS: Transverse heart size stable, and remains within normal limits. Mediastinal silhouette normal. Aortic atherosclerosis. Lungs are hyperinflated with underlying emphysematous changes. Diffusely increased vascularity with interstitial prominence consistent with superimposed pulmonary interstitial edema. Associated small left pleural effusion. Hazy bibasilar opacities favored to reflect atelectasis and/or edema. Superimposed infection would be difficult to exclude, and could be considered in the correct clinical setting. No pneumothorax. No acute osseous finding. Thoracolumbar scoliosis noted. IMPRESSION: 1. Findings consistent with moderate diffuse pulmonary interstitial edema. Hazy bibasilar opacities, favored to reflect atelectasis and/or edema. Superimposed infection would be difficult to exclude, and could be considered in the correct clinical setting. 2. Associated small left pleural effusion. 3. Aortic Atherosclerosis (ICD10-I70.0) and Emphysema (ICD10-J43.9). Electronically Signed   By: Jeannine Boga M.D.   On: 02/18/2020 06:03     Medical  Consultants:    None.  Anti-Infectives:   none  Subjective:    Jill Hudson she relates her breathing is unchanged compared to yesterday she continues to get dyspneic just with sitting up.  Objective:    Vitals:   02/18/20 1938 02/18/20 2313 02/19/20 0141 02/19/20 0142  BP: (!) 105/50   (!) 109/52  Pulse: (!) 106   95  Resp: 20   20  Temp: 97.8 F (36.6 C)   98 F (36.7 C)  TempSrc: Oral     SpO2: 92% 93%  93%  Weight:   55 kg   Height:       SpO2: 93 % O2 Flow Rate (L/min): 2 L/min FiO2 (%): 50 %   Intake/Output Summary (Last 24 hours) at 02/19/2020 0747 Last data filed at 02/19/2020 0142 Gross per 24 hour  Intake 353 ml  Output 100 ml  Net 253 ml   Filed Weights   02/18/20 0605 02/18/20 1457 02/19/20 0141  Weight: 53.1 kg 54.8 kg 55 kg    Exam: General exam: In no acute distress. Respiratory system: Moderate air movement with wheezing bilaterally but more pronounced on the right side. Cardiovascular system: S1 & S2 heard, RRR. No JVD, no hepatojugular reflux. Gastrointestinal system: Abdomen is nondistended,  soft and nontender.  Central nervous system: Alert and oriented. No focal neurological deficits. Extremities: No pedal edema. Skin: No rashes, lesions or ulcers Psychiatry: Judgement and insight appear normal. Mood & affect appropriate.   Data Reviewed:    Labs: Basic Metabolic Panel: Recent Labs  Lab 02/18/20 0517 02/18/20 0517 02/18/20 0758 02/18/20 0758 02/18/20 1431 02/18/20 1431 02/18/20 2302 02/19/20 0506  NA 128*  --  131*  --  131*  --  129* 129*  K 4.0   < > 4.7   < > 4.6   < > 4.7 5.1  CL 97*  --  100  --  99  --  97* 98  CO2 19*  --  21*  --  19*  --  18* 20*  GLUCOSE 253*  --  268*  --  321*  --  286* 206*  BUN 32*  --  33*  --  35*  --  49* 57*  CREATININE 1.21*  --  1.12*  --  1.49*  --  1.68* 1.85*  CALCIUM 8.6*  --  8.6*  --  8.9  --  9.1 9.0  MG  --   --   --   --   --   --   --  2.1   < > = values in this interval  not displayed.   GFR Estimated Creatinine Clearance: 17.9 mL/min (A) (by C-G formula based on SCr of 1.85 mg/dL (H)). Liver Function Tests: No results for input(s): AST, ALT, ALKPHOS, BILITOT, PROT, ALBUMIN in the last 168 hours. No results for input(s): LIPASE, AMYLASE in the last 168 hours. No results for input(s): AMMONIA in the last 168 hours. Coagulation profile No results for input(s): INR, PROTIME in the last 168 hours. COVID-19 Labs  No results for input(s): DDIMER, FERRITIN, LDH, CRP in the last 72 hours.  Lab Results  Component Value Date   SARSCOV2NAA NEGATIVE 02/18/2020   [place] NEGATIVE 12/25/2019    CBC: Recent Labs  Lab 02/18/20 0517 02/19/20 0506  WBC 17.2* 15.3*  NEUTROABS 13.3*  --   HGB 10.2* 9.3*  HCT 31.7* 28.0*  MCV 91.4 90.0  PLT 299 268   Cardiac Enzymes: No results for input(s): CKTOTAL, CKMB, CKMBINDEX, TROPONINI in the last 168 hours. BNP (last 3 results) No results for input(s): PROBNP in the last 8760 hours. CBG: Recent Labs  Lab 02/18/20 1318 02/18/20 1626 02/18/20 2048 02/18/20 2352 02/19/20 0419  GLUCAP 266* 278* 330* 248* 193*   D-Dimer: No results for input(s): DDIMER in the last 72 hours. Hgb A1c: Recent Labs    02/18/20 0758  HGBA1C 6.3*   Lipid Profile: Recent Labs    02/19/20 0506  CHOL 117  HDL 56  LDLCALC 54  TRIG 34  CHOLHDL 2.1   Thyroid function studies: No results for input(s): TSH, T4TOTAL, T3FREE, THYROIDAB in the last 72 hours.  Invalid input(s): FREET3 Anemia work up: No results for input(s): VITAMINB12, FOLATE, FERRITIN, TIBC, IRON, RETICCTPCT in the last 72 hours. Sepsis Labs: Recent Labs  Lab 02/18/20 0517 02/18/20 0629 02/18/20 0758 02/19/20 0506  PROCALCITON  --  <0.10  --   --   WBC 17.2*  --   --  15.3*  LATICACIDVEN 2.5*  --  1.8  --    Microbiology Recent Results (from the past 240 hour(s))  Respiratory Panel by RT PCR (Flu A&B, Covid) - Nasopharyngeal Swab     Status: None    Collection Time: 02/18/20  5:17  AM   Specimen: Nasopharyngeal Swab  Result Value Ref Range Status   SARS Coronavirus 2 by RT PCR NEGATIVE NEGATIVE Final    Comment: (NOTE) SARS-CoV-2 target nucleic acids are NOT DETECTED. The SARS-CoV-2 RNA is generally detectable in upper respiratoy specimens during the acute phase of infection. The lowest concentration of SARS-CoV-2 viral copies this assay can detect is 131 copies/mL. A negative result does not preclude SARS-Cov-2 infection and should not be used as the sole basis for treatment or other patient management decisions. A negative result may occur with  improper specimen collection/handling, submission of specimen other than nasopharyngeal swab, presence of viral mutation(s) within the areas targeted by this assay, and inadequate number of viral copies (<131 copies/mL). A negative result must be combined with clinical observations, patient history, and epidemiological information. The expected result is Negative. Fact Sheet for Patients:  PinkCheek.be Fact Sheet for Healthcare Providers:  GravelBags.it This test is not yet ap proved or cleared by the [place] FDA and  has been authorized for detection and/or diagnosis of SARS-CoV-2 by FDA under an Emergency Use Authorization (EUA). This EUA will remain  in effect (meaning this test can be used) for the duration of the COVID-19 declaration under Section 564(b)(1) of the Act, 21 U.S.C. section 360bbb-3(b)(1), unless the authorization is terminated or revoked sooner.    Influenza A by PCR NEGATIVE NEGATIVE Final   Influenza B by PCR NEGATIVE NEGATIVE Final    Comment: (NOTE) The Xpert Xpress SARS-CoV-2/FLU/RSV assay is intended as an aid in  the diagnosis of influenza from Nasopharyngeal swab specimens and  should not be used as a sole basis for treatment. Nasal washings and  aspirates are unacceptable for Xpert Xpress  SARS-CoV-2/FLU/RSV  testing. Fact Sheet for Patients: PinkCheek.be Fact Sheet for Healthcare Providers: GravelBags.it This test is not yet approved or cleared by the [place] FDA and  has been authorized for detection and/or diagnosis of SARS-CoV-2 by  FDA under an Emergency Use Authorization (EUA). This EUA will remain  in effect (meaning this test can be used) for the duration of the  Covid-19 declaration under Section 564(b)(1) of the Act, 21  U.S.C. section 360bbb-3(b)(1), unless the authorization is  terminated or revoked. Performed at [facility], [address] [place], [place] [address]   Blood culture (routine x 2)     Status: None (Preliminary result)   Collection Time: 02/18/20  6:29 AM   Specimen: BLOOD  Result Value Ref Range Status   Specimen Description BLOOD BLOOD LEFT HAND  Final   Special Requests   Final    BOTTLES DRAWN AEROBIC AND ANAEROBIC Blood Culture results may not be optimal due to an excessive volume of blood received in culture bottles   Culture   Final    NO GROWTH 1 DAY Performed at [facility], [address] [place], [place] [address]    Report Status PENDING  Incomplete  Blood culture (routine x 2)     Status: None (Preliminary result)   Collection Time: 02/18/20  6:29 AM   Specimen: BLOOD  Result Value Ref Range Status   Specimen Description BLOOD LEFT ANTECUBITAL  Final   Special Requests   Final    BOTTLES DRAWN AEROBIC AND ANAEROBIC Blood Culture adequate volume   Culture   Final    NO GROWTH 1 DAY Performed at [facility], [address], [place], [place] [address]    Report Status PENDING  Incomplete  Medications:   . clopidogrel  37.5 mg Oral Daily  . diltiazem  180 mg Oral BID  . enoxaparin (LOVENOX) injection  30 mg Subcutaneous Q24H  . feeding supplement (GLUCERNA SHAKE)  237 mL Oral TID BM  . ferrous sulfate  325 mg Oral Q  breakfast  . gabapentin  100-200 mg Oral QHS  . insulin aspart  0-9 Units Subcutaneous Q4H  . insulin glargine  8 Units Subcutaneous Daily  . ipratropium-albuterol  3 mL Nebulization Q4H  . losartan  50 mg Oral Daily  . melatonin  2.5 mg Oral QHS  . multivitamin-lutein  1 capsule Oral BID  . rosuvastatin  5 mg Oral Once per day on Mon Wed Fri Sat  . sodium chloride flush  3 mL Intravenous Q12H  . vitamin B-12  1,000 mcg Oral Daily   Continuous Infusions: . sodium chloride        LOS: 1 day   Charlynne Cousins  [facility]  02/19/2020, 7:47 AM

## 2020-02-19 NOTE — Progress Notes (Signed)
Physical Therapy Evaluation Patient Details Name: Jill Hudson MRN: [id number] DOB: February 12, 1935 Today's Date: 02/19/2020   History of Present Illness  ANGELETTE GANUS is a 85 y.o. female with medical history significant of Hypertension, hyperlipidemia, diabetes mellitus, stroke, TIA, GERD, anxiety, PVD, CAD, myocardial infarction, former smoker, CKD stage III, GI bleeding, iron deficiency anemia, who presented to the ED with shortness of breath. Breathing ad been progressively worsening.  Per EMS, patient pt woke up with gasping for air, and rescue found pt's oxygen saturation at 79% on RA. Pt was given 125 mg of solumedrol and 1.5 duonebs, pt's oxygen saturation improved to 98% on NSR. At arrival to ED, pt still has acute respiratory distress.  BiPAP was started.  Patient states that she has dry cough, no fever or chills.  She had some chest discomfort earlier, which has resolved. Patient states that she has mild intermittent loose stool bowel movement recently, but currently no nausea, vomiting, diarrhea or abdominal pain.  No symptoms of UTI or unilateral weakness. She was found to have BNP 600, troponin 67 -->143, WBC 17.2, negative Covid PCR, lactic acid 2.5, sodium 128, renal function close to baseline, temperature 97.7, blood pressure 165/56, heart rate 76, RR 27, oxygen saturation 99% on BiPAP.  Chest x-ray showed interstitial pulmonary edema and obesity bilateral basilar opacity.  Patient is now admitted to stepdown as inpatient for acute respiratory failure secondary to COPD exacerbation, CHF with elevated troponin likely due to demand ischemia, and hypotension. Per cardiology note pt also with recent fall and scalp contusion.   Clinical Impression  Pt admitted with above diagnosis. Pt currently with functional limitations due to the deficits listed below (see PT Problem List).  Pt received and left upright in recliner so bed mobility not assessed. Pt requires additional time to come to standing  during transfers. She is mildly unsteady but is able to stabilize herself without external assist. She is able to ambulate to RN station, down hall, and back to room using a rolling walker. Gait is slow but steady and pt is stable with RW without external support for balance. SpO2 at rest on room air is 91%. With activity SpO2 drops to 86% on room air. In order to SpO2 to recover pt requires donning of nasal cannula with 2L/min supplemental O2. During ambulation with 2L/min O2 via [place] SpO2 remains around 91%. She demonstrates fair balance in standing with feet apart however she is unable to maintain balance with feet together and eyes open longer than 2-3s before losing her balance. She has had 2 falls in the last 6 months. Currently qualifies for home O2 at discharge. Recommend HH PT at discharge and pt advised to use her rollator. She has no concerns about returning home safely and navigating her home. Pt will benefit from PT services to address deficits in strength, balance, and mobility in order to return to full function at home.        Follow Up Recommendations Home health PT    Equipment Recommendations  None recommended by PT;Other (comment)(Pt advised to use her rollator at discharge)    Recommendations for Other Services       Precautions / Restrictions Precautions Precautions: Fall Restrictions Weight Bearing Restrictions: No      Mobility  Bed Mobility               General bed mobility comments: Pt received and left upright in recliner  Transfers Overall transfer level: Needs assistance Equipment used: Rolling walker (  2 wheeled) Transfers: Sit to/from Stand Sit to Stand: Min guard         General transfer comment: Pt requires additional time to come to standing. She is mildly unsteady but is able to stabilize herself without external assist.   Ambulation/Gait Ambulation/Gait assistance: Min guard Gait Distance (Feet): 150 Feet Assistive device: Rolling walker  (2 wheeled)       General Gait Details: Pt is able to ambulate to RN station, down hall, and back to room using a rolling walker. Gait is slow but steady and pt is stable with RW without external support for balance. SpO2 at rest on room air is 91%. With activity SpO2 drops to 86% on room air. In order to SpO2 to recover pt requires donning of nasal cannula with 2L/min supplemental O2. During ambulation with 2L/min O2 via [place] SpO2 remains around 91%.   Stairs            Wheelchair Mobility    Modified Rankin (Stroke Patients Only)       Balance Overall balance assessment: Needs assistance Sitting-balance support: No upper extremity supported Sitting balance-Leahy Scale: Good     Standing balance support: No upper extremity supported Standing balance-Leahy Scale: Fair Standing balance comment: Pt unable to maintain balance with feet together and eyes open longer than 2-3s before losing her balance                             Pertinent Vitals/Pain Pain Assessment: No/denies pain    Home Living Family/patient expects to be discharged to:: Private residence Living Arrangements: Spouse/significant other;Children(Daughter) Available Help at Discharge: Family;Available PRN/intermittently Type of Home: House Home Access: Stairs to enter Entrance Stairs-Rails: Left Entrance Stairs-Number of Steps: 3 Home Layout: Two level;Able to live on main level with bedroom/bathroom Home Equipment: Bedside commode;Toilet riser;Walker - 4 wheels;Walker - standard;Crutches;Grab bars - toilet;Grab bars - tub/shower;Wheelchair - manual      Prior Function Level of Independence: Independent with assistive device(s)         Comments: Ind amb in home without an AD, Mod Ind amb limited community distances with a SPC, Ind with ADLs, two fall in the last 6 months, daughter assists with ADLs     Hand Dominance   Dominant Hand: Right    Extremity/Trunk Assessment   Upper  Extremity Assessment Upper Extremity Assessment: Overall WFL for tasks assessed    Lower Extremity Assessment Lower Extremity Assessment: Generalized weakness       Communication   Communication: No difficulties  Cognition Arousal/Alertness: Awake/alert Behavior During Therapy: WFL for tasks assessed/performed Overall Cognitive Status: Within Functional Limits for tasks assessed                                        General Comments      Exercises     Assessment/Plan    PT Assessment Patient needs continued PT services  PT Problem List Decreased strength;Decreased activity tolerance;Decreased balance;Decreased mobility;Cardiopulmonary status limiting activity       PT Treatment Interventions DME instruction;Gait training;Stair training;Functional mobility training;Therapeutic activities;Therapeutic exercise;Balance training;Neuromuscular re-education;Patient/family education    PT Goals (Current goals can be found in the Care Plan section)  Acute Rehab PT Goals Patient Stated Goal: Return to her prior level of function at home. Pt hopes to stay off supplemental O2 PT Goal Formulation: With patient Time  For Goal Achievement: 03/04/20 Potential to Achieve Goals: Good    Frequency Min 2X/week   Barriers to discharge        Co-evaluation               AM-PAC PT "6 Clicks" Mobility  Outcome Measure Help needed turning from your back to your side while in a flat bed without using bedrails?: None Help needed moving from lying on your back to sitting on the side of a flat bed without using bedrails?: None Help needed moving to and from a bed to a chair (including a wheelchair)?: A Little Help needed standing up from a chair using your arms (e.g., wheelchair or bedside chair)?: A Little Help needed to walk in hospital room?: A Little Help needed climbing 3-5 steps with a railing? : A Little 6 Click Score: 20    End of Session Equipment Utilized  During Treatment: Gait belt Activity Tolerance: Patient tolerated treatment well Patient left: in chair;with call bell/phone within reach;with nursing/sitter in room;Other (comment)(CNA taking vitals, requested CNA connect chair pad) Nurse Communication: Mobility status;Other (comment)(SpO2 on room air at rest and with activity) PT Visit Diagnosis: Unsteadiness on feet (R26.81);Muscle weakness (generalized) (M62.81);Repeated falls (R29.6)    Time: 7588-3254 PT Time Calculation (min) (ACUTE ONLY): 19 min   Charges:   PT Evaluation $PT Eval Low Complexity: 1 Low          Atiya Yera D Cuong Moorman PT, DPT, GCS   Pheon[facility] [place] 02/19/2020, 5:09 PM

## 2020-02-20 ENCOUNTER — Inpatient Hospital Stay: Payer: Medicare Other

## 2020-02-20 ENCOUNTER — Encounter: Payer: Self-pay | Admitting: Internal Medicine

## 2020-02-20 DIAGNOSIS — J9601 Acute respiratory failure with hypoxia: Secondary | ICD-10-CM

## 2020-02-20 DIAGNOSIS — R778 Other specified abnormalities of plasma proteins: Secondary | ICD-10-CM

## 2020-02-20 DIAGNOSIS — J441 Chronic obstructive pulmonary disease with (acute) exacerbation: Secondary | ICD-10-CM

## 2020-02-20 DIAGNOSIS — N179 Acute kidney failure, unspecified: Secondary | ICD-10-CM

## 2020-02-20 LAB — CBC WITH DIFFERENTIAL/PLATELET
Abs Immature Granulocytes: 0.34 10*3/uL — ABNORMAL HIGH (ref 0.00–0.07)
Basophils Absolute: 0 10*3/uL (ref 0.0–0.1)
Basophils Relative: 0 %
Eosinophils Absolute: 0 10*3/uL (ref 0.0–0.5)
Eosinophils Relative: 0 %
HCT: 29.1 % — ABNORMAL LOW (ref 36.0–46.0)
Hemoglobin: 9.3 g/dL — ABNORMAL LOW (ref 12.0–15.0)
Immature Granulocytes: 1 %
Lymphocytes Relative: 6 %
Lymphs Abs: 1.4 10*3/uL (ref 0.7–4.0)
MCH: 29.5 pg (ref 26.0–34.0)
MCHC: 32 g/dL (ref 30.0–36.0)
MCV: 92.4 fL (ref 80.0–100.0)
Monocytes Absolute: 1 10*3/uL (ref 0.1–1.0)
Monocytes Relative: 4 %
Neutro Abs: 22.5 10*3/uL — ABNORMAL HIGH (ref 1.7–7.7)
Neutrophils Relative %: 89 %
Platelets: 339 10*3/uL (ref 150–400)
RBC: 3.15 MIL/uL — ABNORMAL LOW (ref 3.87–5.11)
RDW: 13.9 % (ref 11.5–15.5)
Smear Review: NORMAL
WBC: 25.2 10*3/uL — ABNORMAL HIGH (ref 4.0–10.5)
nRBC: 0 % (ref 0.0–0.2)

## 2020-02-20 LAB — PROTIME-INR
INR: 1.2 (ref 0.8–1.2)
Prothrombin Time: 15.4 seconds — ABNORMAL HIGH (ref 11.4–15.2)

## 2020-02-20 LAB — BASIC METABOLIC PANEL
Anion gap: 14 (ref 5–15)
BUN: 77 mg/dL — ABNORMAL HIGH (ref 8–23)
CO2: 17 mmol/L — ABNORMAL LOW (ref 22–32)
Calcium: 9.2 mg/dL (ref 8.9–10.3)
Chloride: 96 mmol/L — ABNORMAL LOW (ref 98–111)
Creatinine, Ser: 2.33 mg/dL — ABNORMAL HIGH (ref 0.44–1.00)
GFR calc Af Amer: 22 mL/min — ABNORMAL LOW (ref >60)
GFR calc non Af Amer: 19 mL/min — ABNORMAL LOW (ref >60)
Glucose, Bld: 232 mg/dL — ABNORMAL HIGH (ref 70–99)
Potassium: 4.8 mmol/L (ref 3.5–5.1)
Sodium: 127 mmol/L — ABNORMAL LOW (ref 135–145)

## 2020-02-20 LAB — GLUCOSE, CAPILLARY
Glucose-Capillary: 181 mg/dL — ABNORMAL HIGH (ref 70–99)
Glucose-Capillary: 218 mg/dL — ABNORMAL HIGH (ref 70–99)
Glucose-Capillary: 231 mg/dL — ABNORMAL HIGH (ref 70–99)
Glucose-Capillary: 234 mg/dL — ABNORMAL HIGH (ref 70–99)
Glucose-Capillary: 270 mg/dL — ABNORMAL HIGH (ref 70–99)
Glucose-Capillary: 342 mg/dL — ABNORMAL HIGH (ref 70–99)

## 2020-02-20 LAB — HEMOGLOBIN A1C
Hgb A1c MFr Bld: 6.6 % — ABNORMAL HIGH (ref 4.8–5.6)
Mean Plasma Glucose: 142.72 mg/dL

## 2020-02-20 LAB — TROPONIN I (HIGH SENSITIVITY)
Troponin I (High Sensitivity): 1409 ng/L (ref <18)
Troponin I (High Sensitivity): 1327 ng/L (ref <18)

## 2020-02-20 LAB — HEPARIN LEVEL (UNFRACTIONATED): Heparin Unfractionated: 1.2 IU/mL — ABNORMAL HIGH (ref 0.30–0.70)

## 2020-02-20 LAB — APTT: aPTT: 31 seconds (ref 24–36)

## 2020-02-20 MED ORDER — METHYLPREDNISOLONE SODIUM SUCC 40 MG IJ SOLR: 20.0000 mg | Freq: Two times a day (BID) | INTRAMUSCULAR | Status: DC

## 2020-02-20 MED ORDER — GABAPENTIN 100 MG PO CAPS
100.0000 mg | ORAL_CAPSULE | Freq: Every day | ORAL | Status: DC
  Administered 2020-02-20 – 2020-02-25 (×6): 100 mg via ORAL
  Filled 2020-02-20 (×6): qty 1

## 2020-02-20 MED ORDER — IPRATROPIUM-ALBUTEROL 0.5-2.5 (3) MG/3ML IN SOLN
3.0000 mL | RESPIRATORY_TRACT | Status: DC
  Administered 2020-02-20 – 2020-02-22 (×11): 3 mL via RESPIRATORY_TRACT
  Filled 2020-02-20 (×9): qty 3

## 2020-02-20 MED ORDER — INSULIN ASPART 100 UNIT/ML ~~LOC~~ SOLN
0.0000 [IU] | Freq: Every day | SUBCUTANEOUS | Status: DC
  Administered 2020-02-20 – 2020-02-22 (×3): 2 [IU] via SUBCUTANEOUS
  Administered 2020-02-23 – 2020-02-24 (×2): 4 [IU] via SUBCUTANEOUS
  Filled 2020-02-20 (×5): qty 1

## 2020-02-20 MED ORDER — METOPROLOL TARTRATE 25 MG PO TABS
25.0000 mg | ORAL_TABLET | Freq: Two times a day (BID) | ORAL | Status: DC
  Administered 2020-02-20 – 2020-02-26 (×13): 25 mg via ORAL
  Filled 2020-02-20 (×13): qty 1

## 2020-02-20 MED ORDER — NITROGLYCERIN IN D5W 200-5 MCG/ML-% IV SOLN: 0.0000 ug/min | INTRAVENOUS | Status: DC

## 2020-02-20 MED ORDER — METHYLPREDNISOLONE SODIUM SUCC 40 MG IJ SOLR
40.0000 mg | Freq: Two times a day (BID) | INTRAMUSCULAR | Status: DC
  Administered 2020-02-20 – 2020-02-22 (×4): 40 mg via INTRAVENOUS
  Filled 2020-02-20 (×4): qty 1

## 2020-02-20 MED ORDER — METHYLPREDNISOLONE SODIUM SUCC 40 MG IJ SOLR
20.0000 mg | Freq: Once | INTRAMUSCULAR | Status: AC
  Administered 2020-02-20: 20 mg via INTRAVENOUS
  Filled 2020-02-20: qty 1

## 2020-02-20 MED ORDER — BUDESONIDE 0.5 MG/2ML IN SUSP
0.5000 mg | Freq: Two times a day (BID) | RESPIRATORY_TRACT | Status: DC
  Administered 2020-02-20 – 2020-02-25 (×11): 0.5 mg via RESPIRATORY_TRACT
  Filled 2020-02-20 (×12): qty 2

## 2020-02-20 MED ORDER — HEPARIN BOLUS VIA INFUSION
3000.0000 [IU] | Freq: Once | INTRAVENOUS | Status: AC
  Administered 2020-02-20: 3000 [IU] via INTRAVENOUS
  Filled 2020-02-20: qty 3000

## 2020-02-20 MED ORDER — SODIUM CHLORIDE 0.9 % IV SOLN: INTRAVENOUS | Status: DC

## 2020-02-20 MED ORDER — SODIUM CHLORIDE 0.9% FLUSH
3.0000 mL | Freq: Two times a day (BID) | INTRAVENOUS | Status: DC
  Administered 2020-02-20 – 2020-02-26 (×11): 3 mL via INTRAVENOUS

## 2020-02-20 MED ORDER — AZITHROMYCIN 250 MG PO TABS
250.0000 mg | ORAL_TABLET | Freq: Every day | ORAL | Status: AC
  Administered 2020-02-21 – 2020-02-24 (×4): 250 mg via ORAL
  Filled 2020-02-20 (×4): qty 1

## 2020-02-20 MED ORDER — MORPHINE SULFATE (PF) 2 MG/ML IV SOLN
2.0000 mg | INTRAVENOUS | Status: DC | PRN
  Administered 2020-02-21: 2 mg via INTRAVENOUS
  Filled 2020-02-20: qty 1

## 2020-02-20 MED ORDER — INSULIN GLARGINE 100 UNIT/ML ~~LOC~~ SOLN
10.0000 [IU] | Freq: Two times a day (BID) | SUBCUTANEOUS | Status: DC
  Administered 2020-02-20: 10 [IU] via SUBCUTANEOUS
  Administered 2020-02-20: 2 [IU] via SUBCUTANEOUS
  Administered 2020-02-21 – 2020-02-22 (×3): 10 [IU] via SUBCUTANEOUS
  Filled 2020-02-20 (×6): qty 0.1

## 2020-02-20 MED ORDER — HEPARIN (PORCINE) 25000 UT/250ML-% IV SOLN
550.0000 [IU]/h | INTRAVENOUS | Status: DC
  Administered 2020-02-20: 650 [IU]/h via INTRAVENOUS
  Filled 2020-02-20: qty 250

## 2020-02-20 MED ORDER — INSULIN ASPART 100 UNIT/ML ~~LOC~~ SOLN
0.0000 [IU] | Freq: Three times a day (TID) | SUBCUTANEOUS | Status: DC
  Administered 2020-02-20: 7 [IU] via SUBCUTANEOUS
  Administered 2020-02-20 – 2020-02-21 (×2): 3 [IU] via SUBCUTANEOUS
  Administered 2020-02-21: 5 [IU] via SUBCUTANEOUS
  Administered 2020-02-21 – 2020-02-22 (×2): 2 [IU] via SUBCUTANEOUS
  Administered 2020-02-22: 3 [IU] via SUBCUTANEOUS
  Administered 2020-02-22: 1 [IU] via SUBCUTANEOUS
  Administered 2020-02-23: 3 [IU] via SUBCUTANEOUS
  Administered 2020-02-23: 7 [IU] via SUBCUTANEOUS
  Administered 2020-02-24: 3 [IU] via SUBCUTANEOUS
  Administered 2020-02-24 (×2): 2 [IU] via SUBCUTANEOUS
  Administered 2020-02-25: 1 [IU] via SUBCUTANEOUS
  Administered 2020-02-25: 7 [IU] via SUBCUTANEOUS
  Administered 2020-02-26: 3 [IU] via SUBCUTANEOUS
  Filled 2020-02-20 (×15): qty 1

## 2020-02-20 MED ORDER — MORPHINE SULFATE (PF) 2 MG/ML IV SOLN
INTRAVENOUS | Status: AC
  Administered 2020-02-20: 2 mg via INTRAVENOUS
  Filled 2020-02-20: qty 1

## 2020-02-20 MED ORDER — AZITHROMYCIN 250 MG PO TABS
500.0000 mg | ORAL_TABLET | Freq: Every day | ORAL | Status: AC
  Administered 2020-02-20: 500 mg via ORAL
  Filled 2020-02-20: qty 2

## 2020-02-20 MED ORDER — NITROGLYCERIN IN D5W 200-5 MCG/ML-% IV SOLN
0.0000 ug/min | INTRAVENOUS | Status: DC
  Administered 2020-02-20: 5 ug/min via INTRAVENOUS
  Filled 2020-02-20: qty 250

## 2020-02-20 MED ORDER — INSULIN ASPART 100 UNIT/ML ~~LOC~~ SOLN
3.0000 [IU] | Freq: Three times a day (TID) | SUBCUTANEOUS | Status: DC
  Administered 2020-02-20 – 2020-02-26 (×17): 3 [IU] via SUBCUTANEOUS
  Filled 2020-02-20 (×17): qty 1

## 2020-02-20 MED ORDER — HEPARIN (PORCINE) 25000 UT/250ML-% IV SOLN
550.0000 [IU]/h | INTRAVENOUS | Status: DC
  Administered 2020-02-22: 550 [IU]/h via INTRAVENOUS
  Filled 2020-02-20: qty 250

## 2020-02-20 NOTE — Progress Notes (Signed)
Pt stated she wanted to try to come off bipap, respiratory rate improved, RR 18/min, shortness of breath improved. Placed on 2lpm , sats 92% respiratory rate 18/min. RN / MD aware. Will continue to monitor.

## 2020-02-20 NOTE — Progress Notes (Signed)
In to see pt to give AM meds. Pt very SOB. Unable to lay flat due to SOB. Gave pt her inhaler and IV steroids. Still very SOB. O2 turned up to 7L to keep sats over 90% MD in to see pt.   0915 New orders for Bipap. Nitro and heparin and transfer to ICU.  1015 Report called to ICU. 12 lead EKG done. Nitro and heparin infusing. Pt resting comfortably on Bipap.

## 2020-02-20 NOTE — Consult Note (Addendum)
[place] for Heparin Indication: chest pain/ACS  Allergies  Allergen Reactions  . Saxagliptin Diarrhea  . Epinephrine Other (See Comments)    Patient does not remember what happens when she uses this  . Atorvastatin Other (See Comments)    Muscle aches  . Codeine Other (See Comments)    Upset stomach  . Ezetimibe Other (See Comments)    Myalgias(ZETIA)  . Limonene Rash    Patient does not recall this reaction  . Nitrofurantoin Rash and Other (See Comments)    Pruitus  . Sulfa Antibiotics Rash and Other (See Comments)    Sore mouth     Patient Measurements: Height: 5' (152.4 cm) Weight: 56.7 kg (125 lb 1.6 oz) IBW/kg (Calculated) : 45.5 Heparin Dosing Weight: 56.7 kg  Vital Signs: Temp: 97.8 F (36.6 C) (04/27 0757) Temp Source: Oral (04/27 0757) BP: 116/43 (04/27 0757) Pulse Rate: 88 (04/27 0757)  Labs: Recent Labs    02/18/20 0517 02/18/20 0758 02/18/20 1051 02/18/20 1431 02/18/20 1431 02/18/20 2302 02/19/20 0506 02/19/20 1525 02/20/20 0411  HGB 10.2*  --   --   --   --   --  9.3*  --   --   HCT 31.7*  --   --   --   --   --  28.0*  --   --   PLT 299  --   --   --   --   --  268  --   --   CREATININE 1.21*   < >  --  1.49*   < > 1.68* 1.85*  --  2.33*  TROPONINIHS 67*   < > 193* 202*  --   --   --  3,082*  --    < > = values in this interval not displayed.    Estimated Creatinine Clearance: 14.4 mL/min (A) (by C-G formula based on SCr of 2.33 mg/dL (H)).   Medical History: Past Medical History:  Diagnosis Date  . Allergies   . Anxiety   . Arthritis    spine and shoulder  . Atherosclerosis of artery of extremity with rest pain ([place]) 10/12/2018  . Cancer ([place])    skin  . Diabetes mellitus without complication ([place])   . Heart murmur   . Hyperlipidemia   . Hypertension   . Macula lutea degeneration   . Mitral and aortic valve disease   . Myocardial infarction [facility])    may have had a "light" heart attack   . Occasional tremors   . PAD (peripheral artery disease) ([place])   . Shingles    patient unaware but daughter confirms. it was a long time ago  . Stroke [facility]) 01/2017   may have had a slight stroke  . TIA (transient ischemic attack) 01/2017  . UTI (urinary tract infection)   . Vascular disease, peripheral ([place])     Medications:  Medications Prior to Admission  Medication Sig Dispense Refill Last Dose  . acetaminophen (TYLENOL) 650 MG CR tablet Take 1,300 mg by mouth every 8 (eight) hours as needed for pain.   prn at prn  . baclofen (LIORESAL) 10 MG tablet TAKE 1 TABLET(10 MG) BY MOUTH THREE TIMES DAILY (Patient taking differently: Take 10 mg by mouth daily as needed. ) 270 tablet 0 prn at prn  . chlorthalidone (HYGROTON) 25 MG tablet Take 25 mg by mouth daily.    Unknown at Unknown  . clopidogrel (PLAVIX) 75 MG tablet TAKE 1  TABLET BY MOUTH EVERY DAY (Patient taking differently: Take 37.5 mg by mouth daily. ) 30 tablet 1 Unknown at Unknown  . Cyanocobalamin (RA VITAMIN B-12 TR) 1000 MCG TBCR Take 1,000 mcg by mouth daily.    Unknown at Unknown  . diltiazem (CARTIA XT) 180 MG 24 hr capsule Take 180 mg by mouth 2 (two) times daily.    Unknown at Unknown  . ferrous sulfate 325 (65 FE) MG tablet Take 325 mg by mouth daily with breakfast.   Unknown at Unknown  . gabapentin (NEURONTIN) 100 MG capsule TAKE 1 TO 2 CAPSULES(100 TO 200 MG) BY MOUTH AT BEDTIME (Patient taking differently: Take 100-200 mg by mouth at bedtime. ) 180 capsule 6 Unknown at Unknown  . insulin aspart (NOVOLOG FLEXPEN) 100 UNIT/ML FlexPen Inject 3 Units into the skin 3 (three) times daily with meals. At lunch (Patient taking differently: Inject 2 Units into the skin daily. At breakfast) 15 mL 3 Unknown at Unknown  . Insulin Glargine (BASAGLAR KWIKPEN) 100 UNIT/ML SOPN Inject 0.12 mLs (12 Units total) into the skin daily. 15 mL 3 Unknown at Unknown  . losartan (COZAAR) 50 MG tablet Take 50 mg by mouth daily.    Unknown at  Unknown  . metFORMIN (GLUCOPHAGE-XR) 500 MG 24 hr tablet TAKE 1 TABLET BY MOUTH TWICE DAILY 60 tablet 11 Unknown at Unknown  . Multiple Vitamins-Minerals (PRESERVISION AREDS PO) Take 1 capsule by mouth 2 (two) times daily.    Unknown at Unknown  . nitroGLYCERIN (NITROSTAT) 0.4 MG SL tablet 1 tab under the tongue for CP, may repeat in 5 minutes up to 3 doses 25 tablet 5 prn at prn  . rosuvastatin (CRESTOR) 5 MG tablet Take 5 mg by mouth every other day. Mon, Wed, Fri and Sat.   Unknown at Unknown  . B-D ULTRAFINE III SHORT PEN 31G X 8 MM MISC USE AS DIRECTED 100 each 3   . feeding supplement, GLUCERNA SHAKE, (GLUCERNA SHAKE) LIQD Take 237 mLs by mouth 3 (three) times daily between meals. 21330 mL 0   . pantoprazole (PROTONIX) 40 MG tablet Take 1 tablet (40 mg total) by mouth daily. (Patient not taking: Reported on 02/18/2020) 30 tablet 0 Not Taking at Unknown   Scheduled:  . albuterol  2.5 mg Nebulization Q4H  . clopidogrel  37.5 mg Oral Daily  . enoxaparin (LOVENOX) injection  30 mg Subcutaneous Q24H  . feeding supplement (GLUCERNA SHAKE)  237 mL Oral TID BM  . ferrous sulfate  325 mg Oral Q breakfast  . gabapentin  100-200 mg Oral QHS  . insulin aspart  0-5 Units Subcutaneous QHS  . insulin aspart  0-9 Units Subcutaneous TID WC  . insulin aspart  3 Units Subcutaneous TID WC  . insulin glargine  10 Units Subcutaneous BID  . melatonin  2.5 mg Oral QHS  . methylPREDNISolone (SOLU-MEDROL) injection  20 mg Intravenous Q12H  . metoprolol tartrate  25 mg Oral BID  . multivitamin-lutein  1 capsule Oral BID  . rosuvastatin  5 mg Oral Once per day on Mon Wed Fri Sat  . sodium chloride flush  3 mL Intravenous Q12H  . tiotropium  18 mcg Inhalation Daily  . vitamin B-12  1,000 mcg Oral Daily   Infusions:  . sodium chloride    . sodium chloride    . doxycycline (VIBRAMYCIN) IV 100 mg (02/20/20 0903)  . nitroGLYCERIN     PRN: sodium chloride, acetaminophen, albuterol, baclofen, clonazepam,  dextromethorphan-guaiFENesin, hydrALAZINE, nitroGLYCERIN, ondansetron (  ZOFRAN) IV, sodium chloride flush Anti-infectives (From admission, onward)   Start     Dose/Rate Route Frequency Ordered Stop   02/19/20 1000  doxycycline (VIBRAMYCIN) 100 mg in sodium chloride 0.9 % 250 mL IVPB     100 mg 125 mL/hr over 120 Minutes Intravenous Every 12 hours 02/19/20 0818     02/18/20 0630  cefTRIAXone (ROCEPHIN) 1 g in sodium chloride 0.9 % 100 mL IVPB     1 g 200 mL/hr over 30 Minutes Intravenous  Once 02/18/20 0616 02/18/20 0809   02/18/20 0630  azithromycin (ZITHROMAX) 500 mg in sodium chloride 0.9 % 250 mL IVPB     500 mg 250 mL/hr over 60 Minutes Intravenous  Once 02/18/20 0616 02/18/20 0810      Assessment: Pharmacy consulted to start heparin for ACS. NO DOAC PTA noted. Baseline labs ordered.   Goal of Therapy:  Heparin level 0.3-0.7 units/ml Monitor platelets by anticoagulation protocol: Yes   Plan:  Give 3000 units bolus x 1, patient received enoxaparin 30 mg daily x 1 last night.  Start heparin infusion at 650 units/hr Check anti-Xa level in 8 hours and daily while on heparin Continue to monitor H&H and platelets  Oswald Hillock, PharmD, BCPS 02/20/2020,9:14 AM

## 2020-02-20 NOTE — Progress Notes (Addendum)
[facility] PROGRESS NOTE    Progress Note  Jill Hudson  NGE:[id number] DOB: 1935-03-16 DOA: 02/18/2020 PCP: [name], MD     Brief Narrative:   Jill Hudson is an 85 y.o. female past medical history significant for hypertension, uncontrolled diabetes mellitus type 2 last hemoglobin A1c of 6.3, chronic kidney disease stage III and creatinine of less than 1, history of's CVA/TIA peripheral vascular disease and coronary artery disease, MI, tobacco use, GI bleed, iron deficiency anemia comes into the hospital for shortness of breath, the patient relates it started the day prior to admission she called EMS and was found to be satting 79% on room air was given IV Solu-Medrol and inhalers saturations improved to 98% in sinus rhythm on telemetry.  In the ED she was placed on BiPAP, she was found to have troponins of 67, BNP of 600 white blood cell count of 17.5, procalcitonin low yield lactic acid of 2.5, influenza PCR and SARS-CoV-2 PCR were negative.  Chest x-ray showed bilateral interstitial infiltrates  Assessment/Plan:   Acute respiratory failure with hypoxia (HCC) due to acute COPD exacerbation and probable NSTEMI/acute pulmonary edema: Chest x-ray showed bilateral infiltrates, negative JVD negative and hepatojugular reflux, no lower extremity edema, although her BNP was elevated at 600 she did get IV Lasix and her creatinine worsen she is wheezing on physical exam. Her estimated dry weight on admission is 55 kg, which is around the same as previous admissions and outpatient visits. She does not have any JVD on physical exam but she does have a crackles at bases on physical examination. Continue IV steroids, antibiotics and inhalers, she is desatted with ambulation. Chest x-ray we will start her on BiPAP.  We will consult critical care. Use morphine for shortness of breath.  Worsening Elevated troponins/non-ST elevation MI: Currently on Plavix and Crestor, 2D echo showed left  ventricular ejection fraction of 55%, no wall motion abnormality mild left ventricular hypertrophy grade 2 diastolic heart failure RV systolic function was normal moderate elevated pulmonary pressures.   Her cardiac biomarkers were elevated in the range of 3000.  Continue to trend cardiac biomarkers. Discontinue diltiazem and start her on oral metoprolol, started on IV heparin and IV nitroglycerin, she is having significant shortness of breath, will transfer to the unit and place her on BiPAP.  She is currently on Plavix. Cardiology was consulted and recommended a left and right heart cath on 02/21/2020.  New Acute kidney injury: Multifactorial in the setting of Lasix, ARB and probably hypovolemia she did receive Lasix on admission, will hold IV fluids due to her worsening shortness of breath and crackles at bases, monitor strict I's and O's and daily weights. Discontinue Cozaar due to worsening renal failure. I am concerned about decreased cardiac output, she does not have any JVD but she does have crackles on physical exam we will get a chest x-ray.  Hypovolemic hyponatremia: Will hold IV fluid hydration monitor strict I's and O's and daily weights.  She is positive about a liter despite this her hyponatremia is worsening  Essential hypertension: Continue Cozaar and diltiazem, will start oral metoprolol, she also be started on IV nitroglycerin.  History of stroke: Due to her history of GI bleed she is taking half a dose of Plavix, continue Crestor.  Peripheral vascular disease and coronary artery disease: Continue Plavix and Crestor.  Diabetes mellitus type 2 with renal complications: Her most recent A1c was 6.5, we are currently the holding oral hypoglycemic agents. Long-acting insulin  plus sliding scale.  We are decreasing her steroids so her insulin requirements will be less.  Chronic kidney disease stage IIIa: Seems to be at baseline.  Elevated lactic acid: Of 2.5, with my  leukocytosis but afebrile the patient does not have sepsis.    DVT prophylaxis: lovenox Family Communication:none Status is: Inpatient  Remains inpatient appropriate because:Hemodynamically unstable   Dispo: The patient is from: Home              Anticipated d/c is to: Home              Anticipated d/c date is: 3 days              Patient currently is not medically stable to d/c.    Code Status:     Code Status Orders  (From admission, onward)         Start     Ordered   02/18/20 1100  Full code  Continuous     02/18/20 1059        Code Status History    Date Active Date Inactive Code Status Order ID Comments User Context   12/25/2019 0555 12/28/2019 2204 Full Code [id number]  Christel Mormon, MD ED   10/12/2018 1647 10/15/2018 1808 Full Code [id number]  Katha Cabal, MD Inpatient   09/16/2018 1254 09/16/2018 2048 Full Code [id number]  Katha Cabal, MD Inpatient   08/11/2018 1706 08/13/2018 2136 Full Code [id number]  Gorden Harms, MD Inpatient   07/26/2018 0921 07/26/2018 1652 Full Code [id number]  [name], Dolores Lory, MD Inpatient   08/23/2017 0935 08/23/2017 1419 Full Code [id number]  Algernon Huxley, MD Inpatient   02/15/2017 1538 02/16/2017 1959 Full Code [id number]  Dustin Flock, MD Inpatient   Advance Care Planning Activity        IV Access:    Peripheral IV   Procedures and diagnostic studies:   ECHOCARDIOGRAM COMPLETE  Result Date: 02/19/2020    ECHOCARDIOGRAM REPORT   Patient Name:   Jill Hudson Date of Exam: 02/19/2020 Medical Rec #:  [id number]     Height:       60.0 in Accession #:    [id number]    Weight:       121.3 lb Date of Birth:  [birth year]      BSA:          1.509 m Patient Age:    68 years      BP:           128/96 mmHg Patient Gender: F             HR:           94 bpm. Exam Location:  [facility] Procedure: 2D Echo, Cardiac Doppler and Color Doppler Indications:     CHF-acute diastolic 016.01  History:         Patient has prior history of  Echocardiogram examinations, most                  recent 02/15/2017. TIA and Stroke; Risk Factors:Diabetes. MI,                  mitral and AOV disease.  Sonographer:     Sherrie Sport RDCS (AE) Referring Phys:  [id number] Soledad Gerlach NIU Diagnosing Phys: Kathlyn Sacramento MD IMPRESSIONS  1. Left ventricular ejection fraction, by estimation, is 55 to 60%. The left ventricle has normal function. The left ventricle has no regional wall motion abnormalities. There is  mild left ventricular hypertrophy. Left ventricular diastolic parameters are consistent with Grade II diastolic dysfunction (pseudonormalization).  2. Right ventricular systolic function is normal. The right ventricular size is normal. There is moderately elevated pulmonary artery systolic pressure.  3. Left atrial size was mildly dilated.  4. The mitral valve is myxomatous. Moderate mitral valve regurgitation. No evidence of mitral stenosis.  5. The aortic valve is normal in structure. Aortic valve regurgitation is not visualized. Mild aortic valve sclerosis is present, with no evidence of aortic valve stenosis.  6. The inferior vena cava is dilated in size with >50% respiratory variability, suggesting right atrial pressure of 8 mmHg. FINDINGS  Left Ventricle: Left ventricular ejection fraction, by estimation, is 55 to 60%. The left ventricle has normal function. The left ventricle has no regional wall motion abnormalities. The left ventricular internal cavity size was normal in size. There is  mild left ventricular hypertrophy. Left ventricular diastolic parameters are consistent with Grade II diastolic dysfunction (pseudonormalization). Right Ventricle: The right ventricular size is normal. No increase in right ventricular wall thickness. Right ventricular systolic function is normal. There is moderately elevated pulmonary artery systolic pressure. The tricuspid regurgitant velocity is 3.14 m/s, and with an assumed right atrial pressure of 10 mmHg, the estimated right  ventricular systolic pressure is 02.7 mmHg. Left Atrium: Left atrial size was mildly dilated. Right Atrium: Right atrial size was normal in size. Pericardium: There is no evidence of pericardial effusion. 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 There is moderate thickening of the mitral valve leaflet(s). There is mild calcification of the mitral valve leaflet(s). Normal mobility of the mitral valve leaflets. Moderate mitral valve regurgitation, with  eccentric posteriorly directed jet. No evidence of mitral valve stenosis. Tricuspid Valve: The tricuspid valve is normal in structure. Tricuspid valve regurgitation is trivial. No evidence of tricuspid stenosis. Aortic Valve: The aortic valve is normal in structure. Aortic valve regurgitation is not visualized. Mild aortic valve sclerosis is present, with no evidence of aortic valve stenosis. Aortic valve mean gradient measures 3.0 mmHg. Aortic valve peak gradient measures 4.8 mmHg. Aortic valve area, by VTI measures 2.06 cm. Pulmonic Valve: The pulmonic valve was normal in structure. Pulmonic valve regurgitation is not visualized. No evidence of pulmonic stenosis. Aorta: The aortic root is normal in size and structure. Venous: The inferior vena cava is dilated in size with greater than 50% respiratory variability, suggesting right atrial pressure of 8 mmHg. IAS/Shunts: No atrial level shunt detected by color flow Doppler.  LEFT VENTRICLE PLAX 2D LVIDd:         4.40 cm  Diastology LVIDs:         2.99 cm  LV e' lateral:   5.87 cm/s LV PW:         0.91 cm  LV E/e' lateral: 23.0 LV IVS:        0.88 cm  LV e' medial:    8.27 cm/s LVOT diam:     2.00 cm  LV E/e' medial:  16.3 LV SV:         48 LV SV Index:   32 LVOT Area:     3.14 cm  RIGHT VENTRICLE RV Basal diam:  3.46 cm LEFT ATRIUM             Index       RIGHT ATRIUM           Index LA diam:        3.90 cm 2.58 cm/m  RA  Area:     13.10 cm LA Vol (A2C):   64.2 ml 42.54 ml/m RA Volume:   31.50 ml  20.87 ml/m  LA Vol (A4C):   56.1 ml 37.17 ml/m LA Biplane Vol: 62.2 ml 41.21 ml/m  AORTIC VALVE                   PULMONIC VALVE AV Area (Vmax):    1.80 cm    PV Vmax:        0.90 m/s AV Area (Vmean):   1.53 cm    PV Peak grad:   3.2 mmHg AV Area (VTI):     2.06 cm    RVOT Peak grad: 3 mmHg AV Vmax:           109.50 cm/s AV Vmean:          86.000 cm/s AV VTI:            0.232 m AV Peak Grad:      4.8 mmHg AV Mean Grad:      3.0 mmHg LVOT Vmax:         62.70 cm/s LVOT Vmean:        41.800 cm/s LVOT VTI:          0.152 m LVOT/AV VTI ratio: 0.66  AORTA Ao Root diam: 2.50 cm MITRAL VALVE                TRICUSPID VALVE MV Area (PHT): 4.96 cm     TR Peak grad:   39.4 mmHg MV Decel Time: 153 msec     TR Vmax:        314.00 cm/s MV E velocity: 135.00 cm/s MV A velocity: 124.00 cm/s  SHUNTS MV E/A ratio:  1.09         Systemic VTI:  0.15 m                             Systemic Diam: 2.00 cm Kathlyn Sacramento MD Electronically signed by Kathlyn Sacramento MD Signature Date/Time: 02/19/2020/10:12:40 AM    Final      Medical Consultants:    None.  Anti-Infectives:   none  Subjective:    Jill Hudson she relates her breathing is worse than yesterday.  Objective:    Vitals:   02/20/20 0413 02/20/20 0500 02/20/20 0600 02/20/20 0757  BP: (!) 110/59   (!) 116/43  Pulse: 95   88  Resp:  18 20 18   Temp: 98.4 F (36.9 C)   97.8 F (36.6 C)  TempSrc: Oral   Oral  SpO2: 90%   93%  Weight: 56.7 kg     Height:       SpO2: 93 % O2 Flow Rate (L/min): 3 L/min FiO2 (%): 50 %   Intake/Output Summary (Last 24 hours) at 02/20/2020 0858 Last data filed at 02/20/2020 0600 Gross per 24 hour  Intake 760.28 ml  Output 0 ml  Net 760.28 ml   Filed Weights   02/18/20 1457 02/19/20 0141 02/20/20 0413  Weight: 54.8 kg 55 kg 56.7 kg    Exam: General exam: In no acute distress. Respiratory system: Moderate air movement with crackles at bases. Cardiovascular system: S1 & S2 heard, RRR. No JVD, no hepatojugular  reflux. Gastrointestinal system: Abdomen is nondistended, soft and nontender.  Extremities: No pedal edema. Skin: No rashes, lesions or ulcers Psychiatry: Judgement and insight appear normal. Mood & affect appropriate.   Data  Reviewed:    Labs: Basic Metabolic Panel: Recent Labs  Lab 02/18/20 0758 02/18/20 0758 02/18/20 1431 02/18/20 1431 02/18/20 2302 02/18/20 2302 02/19/20 0506 02/20/20 0411  NA 131*  --  131*  --  129*  --  129* 127*  K 4.7   < > 4.6   < > 4.7   < > 5.1 4.8  CL 100  --  99  --  97*  --  98 96*  CO2 21*  --  19*  --  18*  --  20* 17*  GLUCOSE 268*  --  321*  --  286*  --  206* 232*  BUN 33*  --  35*  --  49*  --  57* 77*  CREATININE 1.12*  --  1.49*  --  1.68*  --  1.85* 2.33*  CALCIUM 8.6*  --  8.9  --  9.1  --  9.0 9.2  MG  --   --   --   --   --   --  2.1  --    < > = values in this interval not displayed.   GFR Estimated Creatinine Clearance: 14.4 mL/min (A) (by C-G formula based on SCr of 2.33 mg/dL (H)). Liver Function Tests: No results for input(s): AST, ALT, ALKPHOS, BILITOT, PROT, ALBUMIN in the last 168 hours. No results for input(s): LIPASE, AMYLASE in the last 168 hours. No results for input(s): AMMONIA in the last 168 hours. Coagulation profile No results for input(s): INR, PROTIME in the last 168 hours. COVID-19 Labs  No results for input(s): DDIMER, FERRITIN, LDH, CRP in the last 72 hours.  Lab Results  Component Value Date   SARSCOV2NAA NEGATIVE 02/18/2020   [place] NEGATIVE 12/25/2019    CBC: Recent Labs  Lab 02/18/20 0517 02/19/20 0506  WBC 17.2* 15.3*  NEUTROABS 13.3*  --   HGB 10.2* 9.3*  HCT 31.7* 28.0*  MCV 91.4 90.0  PLT 299 268   Cardiac Enzymes: No results for input(s): CKTOTAL, CKMB, CKMBINDEX, TROPONINI in the last 168 hours. BNP (last 3 results) No results for input(s): PROBNP in the last 8760 hours. CBG: Recent Labs  Lab 02/19/20 1643 02/19/20 2001 02/19/20 2348 02/20/20 0419 02/20/20 0759   GLUCAP 324* 352* 292* 231* 270*   D-Dimer: No results for input(s): DDIMER in the last 72 hours. Hgb A1c: Recent Labs    02/18/20 0758  HGBA1C 6.3*   Lipid Profile: Recent Labs    02/19/20 0506  CHOL 117  HDL 56  LDLCALC 54  TRIG 34  CHOLHDL 2.1   Thyroid function studies: No results for input(s): TSH, T4TOTAL, T3FREE, THYROIDAB in the last 72 hours.  Invalid input(s): FREET3 Anemia work up: No results for input(s): VITAMINB12, FOLATE, FERRITIN, TIBC, IRON, RETICCTPCT in the last 72 hours. Sepsis Labs: Recent Labs  Lab 02/18/20 0517 02/18/20 0629 02/18/20 0758 02/19/20 0506  PROCALCITON  --  <0.10  --   --   WBC 17.2*  --   --  15.3*  LATICACIDVEN 2.5*  --  1.8  --    Microbiology Recent Results (from the past 240 hour(s))  Respiratory Panel by RT PCR (Flu A&B, Covid) - Nasopharyngeal Swab     Status: None   Collection Time: 02/18/20  5:17 AM   Specimen: Nasopharyngeal Swab  Result Value Ref Range Status   SARS Coronavirus 2 by RT PCR NEGATIVE NEGATIVE Final    Comment: (NOTE) SARS-CoV-2 target nucleic acids are NOT DETECTED. The SARS-CoV-2 RNA  is generally detectable in upper respiratoy specimens during the acute phase of infection. The lowest concentration of SARS-CoV-2 viral copies this assay can detect is 131 copies/mL. A negative result does not preclude SARS-Cov-2 infection and should not be used as the sole basis for treatment or other patient management decisions. A negative result may occur with  improper specimen collection/handling, submission of specimen other than nasopharyngeal swab, presence of viral mutation(s) within the areas targeted by this assay, and inadequate number of viral copies (<131 copies/mL). A negative result must be combined with clinical observations, patient history, and epidemiological information. The expected result is Negative. Fact Sheet for Patients:  PinkCheek.be Fact Sheet for  Healthcare Providers:  GravelBags.it This test is not yet ap proved or cleared by the [place] FDA and  has been authorized for detection and/or diagnosis of SARS-CoV-2 by FDA under an Emergency Use Authorization (EUA). This EUA will remain  in effect (meaning this test can be used) for the duration of the COVID-19 declaration under Section 564(b)(1) of the Act, 21 U.S.C. section 360bbb-3(b)(1), unless the authorization is terminated or revoked sooner.    Influenza A by PCR NEGATIVE NEGATIVE Final   Influenza B by PCR NEGATIVE NEGATIVE Final    Comment: (NOTE) The Xpert Xpress SARS-CoV-2/FLU/RSV assay is intended as an aid in  the diagnosis of influenza from Nasopharyngeal swab specimens and  should not be used as a sole basis for treatment. Nasal washings and  aspirates are unacceptable for Xpert Xpress SARS-CoV-2/FLU/RSV  testing. Fact Sheet for Patients: PinkCheek.be Fact Sheet for Healthcare Providers: GravelBags.it This test is not yet approved or cleared by the [place] FDA and  has been authorized for detection and/or diagnosis of SARS-CoV-2 by  FDA under an Emergency Use Authorization (EUA). This EUA will remain  in effect (meaning this test can be used) for the duration of the  Covid-19 declaration under Section 564(b)(1) of the Act, 21  U.S.C. section 360bbb-3(b)(1), unless the authorization is  terminated or revoked. Performed at [facility], [address] [place], [place] [address]   Blood culture (routine x 2)     Status: None (Preliminary result)   Collection Time: 02/18/20  6:29 AM   Specimen: BLOOD  Result Value Ref Range Status   Specimen Description BLOOD BLOOD LEFT HAND  Final   Special Requests   Final    BOTTLES DRAWN AEROBIC AND ANAEROBIC Blood Culture results may not be optimal due to an excessive volume of blood received in culture bottles    Culture   Final    NO GROWTH 2 DAYS Performed at [facility], [address] [place],  [address]    Report Status PENDING  Incomplete  Blood culture (routine x 2)     Status: None (Preliminary result)   Collection Time: 02/18/20  6:29 AM   Specimen: BLOOD  Result Value Ref Range Status   Specimen Description BLOOD LEFT ANTECUBITAL  Final   Special Requests   Final    BOTTLES DRAWN AEROBIC AND ANAEROBIC Blood Culture adequate volume   Culture   Final    NO GROWTH 2 DAYS Performed at [facility], [address], [place],  [address]    Report Status PENDING  Incomplete  Urine culture     Status: None   Collection Time: 02/18/20 11:23 AM   Specimen: Urine, Random  Result Value Ref Range Status   Specimen Description   Final    URINE, RANDOM Performed at  [facility], [address] [place], [place] [address]    Special Requests   Final    NONE Performed at [facility], [address] [place], [place] [address]    Culture   Final    NO GROWTH Performed at [facility], [place] [address] [place],  [address]    Report Status 02/19/2020 FINAL  Final     Medications:   . albuterol  2.5 mg Nebulization Q4H  . clopidogrel  37.5 mg Oral Daily  . diltiazem  180 mg Oral BID  . enoxaparin (LOVENOX) injection  30 mg Subcutaneous Q24H  . feeding supplement (GLUCERNA SHAKE)  237 mL Oral TID BM  . ferrous sulfate  325 mg Oral Q breakfast  . gabapentin  100-200 mg Oral QHS  . insulin aspart  0-9 Units Subcutaneous Q4H  . insulin glargine  8 Units Subcutaneous Daily  . melatonin  2.5 mg Oral QHS  . methylPREDNISolone (SOLU-MEDROL) injection  40 mg Intravenous Q12H  . multivitamin-lutein  1 capsule Oral BID  . rosuvastatin  5 mg Oral Once per day on Mon Wed Fri Sat  . sodium chloride flush  3 mL Intravenous Q12H  . tiotropium  18 mcg Inhalation Daily  . vitamin B-12  1,000 mcg Oral Daily   Continuous  Infusions: . sodium chloride    . doxycycline (VIBRAMYCIN) IV Stopped (02/20/20 0006)      LOS: 2 days   Charlynne Cousins  [facility]  02/20/2020, 8:58 AM

## 2020-02-20 NOTE — Progress Notes (Signed)
Pt currently resting in bed with O2 @3L  via nasal canula with slight dyspnea, however pt states this has improved.  Pt with audible expiratory wheezes in LLL and diminished throughout all other lobes. Bipap orders changed to qhs and prn.  Continue scheduled/prn bronchodilator therapy and iv as well as nebulized steroids. Plan of care discussed with respiratory therapist and pt.  All questions were answered will continue to monitor and assess pt.   Marda Stalker, [place] Pager [phone] (please enter 7 digits) PCCM Consult Pager [phone] (please enter 7 digits)

## 2020-02-20 NOTE — Consult Note (Signed)
[place] for Heparin Indication: chest pain/ACS  Allergies  Allergen Reactions  . Saxagliptin Diarrhea  . Epinephrine Other (See Comments)    Patient does not remember what happens when she uses this  . Atorvastatin Other (See Comments)    Muscle aches  . Codeine Other (See Comments)    Upset stomach  . Ezetimibe Other (See Comments)    Myalgias(ZETIA)  . Limonene Rash    Patient does not recall this reaction  . Nitrofurantoin Rash and Other (See Comments)    Pruitus  . Sulfa Antibiotics Rash and Other (See Comments)    Sore mouth     Patient Measurements: Height: 5' (152.4 cm) Weight: 56.7 kg (125 lb 1.6 oz) IBW/kg (Calculated) : 45.5 Heparin Dosing Weight: 56.7 kg  Vital Signs: Temp: 97.6 F (36.4 C) (04/27 1930) Temp Source: Oral (04/27 1930) BP: 109/66 (04/27 2000) Pulse Rate: 78 (04/27 2000)  Labs: Recent Labs    02/18/20 0517 02/18/20 0517 02/18/20 0758 02/18/20 1431 02/18/20 2302 02/19/20 0506 02/19/20 1525 02/20/20 0411 02/20/20 0917 02/20/20 0932 02/20/20 1121 02/20/20 2001  HGB 10.2*   < >  --   --   --  9.3*  --   --  9.3*  --   --   --   HCT 31.7*  --   --   --   --  28.0*  --   --  29.1*  --   --   --   PLT 299  --   --   --   --  268  --   --  339  --   --   --   APTT  --   --   --   --   --   --   --   --   --  31  --   --   LABPROT  --   --   --   --   --   --   --   --   --  15.4*  --   --   INR  --   --   --   --   --   --   --   --   --  1.2  --   --   HEPARINUNFRC  --   --   --   --   --   --   --   --   --   --   --  1.20*  CREATININE 1.21*  --    < >  --  1.68* 1.85*  --  2.33*  --   --   --   --   TROPONINIHS 67*  --    < >   < >  --   --  3,082*  --  1,409*  --  1,327*  --    < > = values in this interval not displayed.    Estimated Creatinine Clearance: 14.4 mL/min (A) (by C-G formula based on SCr of 2.33 mg/dL (H)).   Medical History: Past Medical History:  Diagnosis Date  .  Allergies   . Anxiety   . Arthritis    spine and shoulder  . Atherosclerosis of artery of extremity with rest pain ([place]) 10/12/2018  . Cancer ([place])    skin  . Diabetes mellitus without complication ([place])   . Heart murmur   . Hyperlipidemia   . Hypertension   . Macula lutea degeneration   .  Mitral and aortic valve disease   . Myocardial infarction [facility])    may have had a "light" heart attack  . Occasional tremors   . PAD (peripheral artery disease) ([place])   . Shingles    patient unaware but daughter confirms. it was a long time ago  . Stroke [facility]) 01/2017   may have had a slight stroke  . TIA (transient ischemic attack) 01/2017  . UTI (urinary tract infection)   . Vascular disease, peripheral ([place])     Medications:  Medications Prior to Admission  Medication Sig Dispense Refill Last Dose  . acetaminophen (TYLENOL) 650 MG CR tablet Take 1,300 mg by mouth every 8 (eight) hours as needed for pain.   prn at prn  . baclofen (LIORESAL) 10 MG tablet TAKE 1 TABLET(10 MG) BY MOUTH THREE TIMES DAILY (Patient taking differently: Take 10 mg by mouth daily as needed. ) 270 tablet 0 prn at prn  . chlorthalidone (HYGROTON) 25 MG tablet Take 25 mg by mouth daily.    Unknown at Unknown  . clopidogrel (PLAVIX) 75 MG tablet TAKE 1 TABLET BY MOUTH EVERY DAY (Patient taking differently: Take 37.5 mg by mouth daily. ) 30 tablet 1 Unknown at Unknown  . Cyanocobalamin (RA VITAMIN B-12 TR) 1000 MCG TBCR Take 1,000 mcg by mouth daily.    Unknown at Unknown  . diltiazem (CARTIA XT) 180 MG 24 hr capsule Take 180 mg by mouth 2 (two) times daily.    Unknown at Unknown  . ferrous sulfate 325 (65 FE) MG tablet Take 325 mg by mouth daily with breakfast.   Unknown at Unknown  . gabapentin (NEURONTIN) 100 MG capsule TAKE 1 TO 2 CAPSULES(100 TO 200 MG) BY MOUTH AT BEDTIME (Patient taking differently: Take 100-200 mg by mouth at bedtime. ) 180 capsule 6 Unknown at Unknown  . insulin aspart (NOVOLOG FLEXPEN) 100  UNIT/ML FlexPen Inject 3 Units into the skin 3 (three) times daily with meals. At lunch (Patient taking differently: Inject 2 Units into the skin daily. At breakfast) 15 mL 3 Unknown at Unknown  . Insulin Glargine (BASAGLAR KWIKPEN) 100 UNIT/ML SOPN Inject 0.12 mLs (12 Units total) into the skin daily. 15 mL 3 Unknown at Unknown  . losartan (COZAAR) 50 MG tablet Take 50 mg by mouth daily.    Unknown at Unknown  . metFORMIN (GLUCOPHAGE-XR) 500 MG 24 hr tablet TAKE 1 TABLET BY MOUTH TWICE DAILY 60 tablet 11 Unknown at Unknown  . Multiple Vitamins-Minerals (PRESERVISION AREDS PO) Take 1 capsule by mouth 2 (two) times daily.    Unknown at Unknown  . nitroGLYCERIN (NITROSTAT) 0.4 MG SL tablet 1 tab under the tongue for CP, may repeat in 5 minutes up to 3 doses 25 tablet 5 prn at prn  . rosuvastatin (CRESTOR) 5 MG tablet Take 5 mg by mouth every other day. Mon, Wed, Fri and Sat.   Unknown at Unknown  . B-D ULTRAFINE III SHORT PEN 31G X 8 MM MISC USE AS DIRECTED 100 each 3   . feeding supplement, GLUCERNA SHAKE, (GLUCERNA SHAKE) LIQD Take 237 mLs by mouth 3 (three) times daily between meals. 21330 mL 0   . pantoprazole (PROTONIX) 40 MG tablet Take 1 tablet (40 mg total) by mouth daily. (Patient not taking: Reported on 02/18/2020) 30 tablet 0 Not Taking at Unknown   Scheduled:  . [START ON 02/21/2020] azithromycin  250 mg Oral Daily  . budesonide (PULMICORT) nebulizer solution  0.5 mg Nebulization BID  . clopidogrel  37.5 mg Oral Daily  . feeding supplement (GLUCERNA SHAKE)  237 mL Oral TID BM  . ferrous sulfate  325 mg Oral Q breakfast  . gabapentin  100 mg Oral QHS  . insulin aspart  0-5 Units Subcutaneous QHS  . insulin aspart  0-9 Units Subcutaneous TID WC  . insulin aspart  3 Units Subcutaneous TID WC  . insulin glargine  10 Units Subcutaneous BID  . ipratropium-albuterol  3 mL Nebulization Q4H  . melatonin  2.5 mg Oral QHS  . methylPREDNISolone (SOLU-MEDROL) injection  40 mg Intravenous Q12H  .  metoprolol tartrate  25 mg Oral BID  . multivitamin-lutein  1 capsule Oral BID  . rosuvastatin  5 mg Oral Once per day on Mon Wed Fri Sat  . sodium chloride flush  3 mL Intravenous Q12H   Infusions:  . heparin     PRN: acetaminophen, albuterol, baclofen, clonazepam, dextromethorphan-guaiFENesin, hydrALAZINE, morphine injection, nitroGLYCERIN, ondansetron (ZOFRAN) IV Anti-infectives (From admission, onward)   Start     Dose/Rate Route Frequency Ordered Stop   02/21/20 1800  azithromycin (ZITHROMAX) tablet 250 mg     250 mg Oral Daily 02/20/20 1056 02/25/20 0959   02/20/20 1800  azithromycin (ZITHROMAX) tablet 500 mg     500 mg Oral Daily-1800 02/20/20 1056 02/20/20 1851   02/19/20 1000  doxycycline (VIBRAMYCIN) 100 mg in sodium chloride 0.9 % 250 mL IVPB  Status:  Discontinued     100 mg 125 mL/hr over 120 Minutes Intravenous Every 12 hours 02/19/20 0818 02/20/20 1032   02/18/20 0630  cefTRIAXone (ROCEPHIN) 1 g in sodium chloride 0.9 % 100 mL IVPB     1 g 200 mL/hr over 30 Minutes Intravenous  Once 02/18/20 0616 02/18/20 0809   02/18/20 0630  azithromycin (ZITHROMAX) 500 mg in sodium chloride 0.9 % 250 mL IVPB     500 mg 250 mL/hr over 60 Minutes Intravenous  Once 02/18/20 0616 02/18/20 0810      Assessment: Pharmacy consulted to start heparin for ACS. NO DOAC PTA noted. Baseline labs ordered.   Goal of Therapy:  Heparin level 0.3-0.7 units/ml Monitor platelets by anticoagulation protocol: Yes   Plan:  Give 3000 units bolus x 1, patient received enoxaparin 30 mg daily x 1 last night.  Start heparin infusion at 650 units/hr Check anti-Xa level in 8 hours and daily while on heparin Continue to monitor H&H and platelets  4/27:  HL @ 2100 = 1.2 Called lab , phlebotomist confirmed level was drawn from arm opposite from IV infusion site. Will hold Heparin gtt for 1 hr and restart drip @ 550 units/hr. Will recheck HL 8 hrs after rate change on 4/28 @ 0600.   Jarrick Fjeld D,  PharmD 02/20/2020,9:06 PM

## 2020-02-20 NOTE — Progress Notes (Addendum)
Progress Note  Patient Name: Jill Hudson Date of Encounter: 02/20/2020  Primary Cardiologist: [facility], Dr. Fletcher Anon  Subjective   She is s/p recent BiPAP today for elevated RR and SOB. She feels her breathing is somewhat improved from before the BiPAP but still continues to bother her. She remains on [place] oxygen. We discussed possible RHC to occur sooner than that of LHC (given her elevated Cr) but with consideration of her orthopnea, as she will need to lay flat for catheterization and currently she does not feel she would be able to do so.   Inpatient Medications    Scheduled Meds: . azithromycin  500 mg Oral q1800   Followed by  . [START ON 02/21/2020] azithromycin  250 mg Oral Daily  . budesonide (PULMICORT) nebulizer solution  0.5 mg Nebulization BID  . clopidogrel  37.5 mg Oral Daily  . feeding supplement (GLUCERNA SHAKE)  237 mL Oral TID BM  . ferrous sulfate  325 mg Oral Q breakfast  . gabapentin  100 mg Oral QHS  . insulin aspart  0-5 Units Subcutaneous QHS  . insulin aspart  0-9 Units Subcutaneous TID WC  . insulin aspart  3 Units Subcutaneous TID WC  . insulin glargine  10 Units Subcutaneous BID  . ipratropium-albuterol  3 mL Nebulization Q4H  . melatonin  2.5 mg Oral QHS  . methylPREDNISolone (SOLU-MEDROL) injection  40 mg Intravenous Q12H  . metoprolol tartrate  25 mg Oral BID  . multivitamin-lutein  1 capsule Oral BID  . rosuvastatin  5 mg Oral Once per day on Mon Wed Fri Sat  . sodium chloride flush  3 mL Intravenous Q12H  . vitamin B-12  1,000 mcg Oral Daily   Continuous Infusions: . sodium chloride    . heparin 650 Units/hr (02/20/20 0949)  . nitroGLYCERIN 5 mcg/min (02/20/20 0953)   PRN Meds: sodium chloride, acetaminophen, albuterol, baclofen, clonazepam, dextromethorphan-guaiFENesin, hydrALAZINE, morphine injection, nitroGLYCERIN, ondansetron (ZOFRAN) IV, sodium chloride flush   Vital Signs    Vitals:   02/20/20 1100 02/20/20 1130 02/20/20 1146  02/20/20 1215  BP: (!) 122/40 (!) 119/40  103/70  Pulse: 85 82  83  Resp: 18 17  19   Temp:      TempSrc:      SpO2: 95% 97% 92% 91%  Weight:      Height:        Intake/Output Summary (Last 24 hours) at 02/20/2020 1257 Last data filed at 02/20/2020 0945 Gross per 24 hour  Intake 880.28 ml  Output 0 ml  Net 880.28 ml   Last 3 Weights 02/20/2020 02/19/2020 02/18/2020  Weight (lbs) 125 lb 1.6 oz 121 lb 4.8 oz 120 lb 12.8 oz  Weight (kg) 56.745 kg 55.021 kg 54.795 kg      Telemetry    NSR, 90s while in room and s/p breathing tx - Personally Reviewed  ECG    No new tracings - Personally Reviewed  Physical Exam   GEN: No acute distress.  S/p BiPAP Neck: JVP difficult to assess  Cardiac: RRR, 1/6 systolic murmur. No rubs, or gallops.  Respiratory: Bilateral wheezing and rhonchi, coarse breath sounds bilaterally. GI: Soft, nontender, non-distended  MS: No significant edema; No deformity. Neuro:  Nonfocal  Psych: Normal affect   Labs    High Sensitivity Troponin:   Recent Labs  Lab 02/18/20 1051 02/18/20 1431 02/19/20 1525 02/20/20 0917 02/20/20 1121  TROPONINIHS 193* 202* 3,082* 1,409* 1,327*      Chemistry Recent Labs  Lab 02/18/20 2302 02/19/20 0506 02/20/20 0411  NA 129* 129* 127*  K 4.7 5.1 4.8  CL 97* 98 96*  CO2 18* 20* 17*  GLUCOSE 286* 206* 232*  BUN 49* 57* 77*  CREATININE 1.68* 1.85* 2.33*  CALCIUM 9.1 9.0 9.2  GFRNONAA 28* 25* 19*  GFRAA 32* 29* 22*  ANIONGAP 14 11 14      Hematology Recent Labs  Lab 02/18/20 0517 02/19/20 0506 02/20/20 0917  WBC 17.2* 15.3* 25.2*  RBC 3.47* 3.11* 3.15*  HGB 10.2* 9.3* 9.3*  HCT 31.7* 28.0* 29.1*  MCV 91.4 90.0 92.4  MCH 29.4 29.9 29.5  MCHC 32.2 33.2 32.0  RDW 13.2 13.4 13.9  PLT 299 268 339    BNP Recent Labs  Lab 02/18/20 0517  BNP 600.0*     DDimer No results for input(s): DDIMER in the last 168 hours.   Radiology    DG Chest 1 View  Result Date: 02/20/2020 CLINICAL DATA:   Dyspnea. EXAM: CHEST  1 VIEW COMPARISON:  February 18, 2020. FINDINGS: Stable cardiomediastinal silhouette. Atherosclerosis of thoracic aorta is noted. No pneumothorax is noted. Stable bibasilar opacities are noted concerning for edema with associated pleural effusions. Bony thorax is unremarkable. IMPRESSION: Aortic atherosclerosis. Stable bibasilar opacities are noted concerning for edema with associated pleural effusions. Electronically Signed   By: Marijo Conception M.D.   On: 02/20/2020 10:50   ECHOCARDIOGRAM COMPLETE  Result Date: 02/19/2020    ECHOCARDIOGRAM REPORT   Patient Name:   NOVIA LANSBERRY Date of Exam: 02/19/2020 Medical Rec #:  [id number]     Height:       60.0 in Accession #:    [id number]    Weight:       121.3 lb Date of Birth:  1935-01-28      BSA:          1.509 m Patient Age:    85 years      BP:           128/96 mmHg Patient Gender: F             HR:           94 bpm. Exam Location:  [facility] Procedure: 2D Echo, Cardiac Doppler and Color Doppler Indications:     CHF-acute diastolic 865.78  History:         Patient has prior history of Echocardiogram examinations, most                  recent 02/15/2017. TIA and Stroke; Risk Factors:Diabetes. MI,                  mitral and AOV disease.  Sonographer:     Sherrie Sport RDCS (AE) Referring Phys:  [id number] Soledad Gerlach NIU Diagnosing Phys: Kathlyn Sacramento MD IMPRESSIONS  1. Left ventricular ejection fraction, by estimation, is 55 to 60%. The left ventricle has normal function. The left ventricle has no regional wall motion abnormalities. There is mild left ventricular hypertrophy. Left ventricular diastolic parameters are consistent with Grade II diastolic dysfunction (pseudonormalization).  2. Right ventricular systolic function is normal. The right ventricular size is normal. There is moderately elevated pulmonary artery systolic pressure.  3. Left atrial size was mildly dilated.  4. The mitral valve is myxomatous. Moderate mitral valve regurgitation. No evidence of  mitral stenosis.  5. The aortic valve is normal in structure. Aortic valve regurgitation is not visualized. Mild aortic valve sclerosis is present, with no evidence of aortic  valve stenosis.  6. The inferior vena cava is dilated in size with >50% respiratory variability, suggesting right atrial pressure of 8 mmHg. FINDINGS  Left Ventricle: Left ventricular ejection fraction, by estimation, is 55 to 60%. The left ventricle has normal function. The left ventricle has no regional wall motion abnormalities. The left ventricular internal cavity size was normal in size. There is  mild left ventricular hypertrophy. Left ventricular diastolic parameters are consistent with Grade II diastolic dysfunction (pseudonormalization). Right Ventricle: The right ventricular size is normal. No increase in right ventricular wall thickness. Right ventricular systolic function is normal. There is moderately elevated pulmonary artery systolic pressure. The tricuspid regurgitant velocity is 3.14 m/s, and with an assumed right atrial pressure of 10 mmHg, the estimated right ventricular systolic pressure is 64.1 mmHg. Left Atrium: Left atrial size was mildly dilated. Right Atrium: Right atrial size was normal in size. Pericardium: There is no evidence of pericardial effusion. 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 There is moderate thickening of the mitral valve leaflet(s). There is mild calcification of the mitral valve leaflet(s). Normal mobility of the mitral valve leaflets. Moderate mitral valve regurgitation, with  eccentric posteriorly directed jet. No evidence of mitral valve stenosis. Tricuspid Valve: The tricuspid valve is normal in structure. Tricuspid valve regurgitation is trivial. No evidence of tricuspid stenosis. Aortic Valve: The aortic valve is normal in structure. Aortic valve regurgitation is not visualized. Mild aortic valve sclerosis is present, with no evidence of aortic valve stenosis. Aortic valve mean gradient  measures 3.0 mmHg. Aortic valve peak gradient measures 4.8 mmHg. Aortic valve area, by VTI measures 2.06 cm. Pulmonic Valve: The pulmonic valve was normal in structure. Pulmonic valve regurgitation is not visualized. No evidence of pulmonic stenosis. Aorta: The aortic root is normal in size and structure. Venous: The inferior vena cava is dilated in size with greater than 50% respiratory variability, suggesting right atrial pressure of 8 mmHg. IAS/Shunts: No atrial level shunt detected by color flow Doppler.  LEFT VENTRICLE PLAX 2D LVIDd:         4.40 cm  Diastology LVIDs:         2.99 cm  LV e' lateral:   5.87 cm/s LV PW:         0.91 cm  LV E/e' lateral: 23.0 LV IVS:        0.88 cm  LV e' medial:    8.27 cm/s LVOT diam:     2.00 cm  LV E/e' medial:  16.3 LV SV:         48 LV SV Index:   32 LVOT Area:     3.14 cm  RIGHT VENTRICLE RV Basal diam:  3.46 cm LEFT ATRIUM             Index       RIGHT ATRIUM           Index LA diam:        3.90 cm 2.58 cm/m  RA Area:     13.10 cm LA Vol (A2C):   64.2 ml 42.54 ml/m RA Volume:   31.50 ml  20.87 ml/m LA Vol (A4C):   56.1 ml 37.17 ml/m LA Biplane Vol: 62.2 ml 41.21 ml/m  AORTIC VALVE                   PULMONIC VALVE AV Area (Vmax):    1.80 cm    PV Vmax:        0.90 m/s AV Area (  Vmean):   1.53 cm    PV Peak grad:   3.2 mmHg AV Area (VTI):     2.06 cm    RVOT Peak grad: 3 mmHg AV Vmax:           109.50 cm/s AV Vmean:          86.000 cm/s AV VTI:            0.232 m AV Peak Grad:      4.8 mmHg AV Mean Grad:      3.0 mmHg LVOT Vmax:         62.70 cm/s LVOT Vmean:        41.800 cm/s LVOT VTI:          0.152 m LVOT/AV VTI ratio: 0.66  AORTA Ao Root diam: 2.50 cm MITRAL VALVE                TRICUSPID VALVE MV Area (PHT): 4.96 cm     TR Peak grad:   39.4 mmHg MV Decel Time: 153 msec     TR Vmax:        314.00 cm/s MV E velocity: 135.00 cm/s MV A velocity: 124.00 cm/s  SHUNTS MV E/A ratio:  1.09         Systemic VTI:  0.15 m                             Systemic Diam: 2.00  cm Kathlyn Sacramento MD Electronically signed by Kathlyn Sacramento MD Signature Date/Time: 02/19/2020/10:12:40 AM    Final     Cardiac Studies   Echo 02/19/20 1. Left ventricular ejection fraction, by estimation, is 55 to 60%. The  left ventricle has normal function. The left ventricle has no regional  wall motion abnormalities. There is mild left ventricular hypertrophy.  Left ventricular diastolic parameters  are consistent with Grade II diastolic dysfunction (pseudonormalization).  2. Right ventricular systolic function is normal. The right ventricular  size is normal. There is moderately elevated pulmonary artery systolic  pressure.  3. Left atrial size was mildly dilated.  4. The mitral valve is myxomatous. Moderate mitral valve regurgitation.  No evidence of mitral stenosis.  5. The aortic valve is normal in structure. Aortic valve regurgitation is  not visualized. Mild aortic valve sclerosis is present, with no evidence  of aortic valve stenosis.  6. The inferior vena cava is dilated in size with >50% respiratory  variability, suggesting right atrial pressure of 8 mmHg.   Patient Profile     85 y.o. female with a history of HFpEF, pulmonary HTN, moderate MR, PVD with bilateral carotid stenosis and previous LLE vascular surgery, HTN, DM2, history of CVA, CKD, prior h/o smoking, and seen today for acute HFpEF exacerbation and elevated HS Tn / NSTEMI.  Assessment & Plan    HFpEF Pulmonary HTN --Reports ongoing SOB with some improvement in tachypnea s/p BiPAP tx. Consider SOB as her anginal equivalent given NSTEMI. Also considered is her anemia (Hgb 9.3), leukocytosis (WBC 25.2), pulmonary HTN, and long history of smoking. PE considered though echo without significant right heart strain. Could consider VQ scan given ongoing hypoxia and elevated heart rate. Recommend further workup of leukocytosis as needed. --BNP at presentation 600. COVID-19 negative. CXR with pulmonary edema.    --4/26 echo with EF nl and G2DD, moderately elevated PASP, moderate MR, and RAP 50mmHg. --Worsening Cr and relatively euvolemic on exam. IV lasix was discontinued earlier  in admission given worsening renal function. Currently receiving IVF for worsening renal function.  --Continue to monitor hypoxia. Consider repeat COVID-19/respiratory panel.  --Current plan for [facility] deferred given renal function and orthopnea. RHC recommended for better understanding of hemodynamics.  NSTEMI History of CAD --No CP reported this admission. --HS Tn peaked at 3082. EKG without acute ST/T changes. --Most recent echo without significant reduction in EF or WMA. --Further ischemic evaluation recommended with R/LHC and deferred at this time given elevated Cr and as unable to lay flat for catheterization table. --Plan for [facility] when renal function and breathing status improve. Possible RHC to occur before that of LHC as above.  --Started on IV heparin and nitro by IM. Recommend close monitoring of CBC. Continue BB as BP allows. Continue statin for risk factor modification. Of note, also on PTA Plavix  for vascular dz.    Leukocytosis --As above, WBC elevated to 25.2. Further workup per IM.   HTN --Soft BP to hypotensive. Titrate antihypertensives as needed to prevent prerenal AKI.   HLD --Continue statin with LDL <70.   CKD Hyponatremia --Cr worsening and 1.85  2.33 with BUN 57  77. Consider nephrology consult. Caution with abx that are renally excreted.  Continue to monitor electrolytes and renal function with daily BMET. Monitor for AMS given low sodium.  Anemia --Continue to monitor on IV heparin. Stable Hgb at this time. Caution with Hgb below 8.5. Recommend further workup if needed. She reports a history of melena but no recent melena this admission. Reports a recent fall with timing unclear as directly below and head CT from last month.  Recent fall Scalp contusion / Nodule --Timing of fall  unclear. States this occurred last Thursday 4/22 and right before her onset of SOB. Current scalp nodule / contusion on top left of head. On review of EMR, head CT performed 3/13 for fall and scalp contusion in same location. Further workup if needed per IM.   PVD --F/u per vascular surgery. H/o L femoral endarterectomy and SFA stenting. Continue statin and PTA Plavix.  Moderate MR --Continue to monitor with periodic echo.  For questions or updates, please contact [place] Please consult [email or web address] for contact info under        Signed, Arvil Chaco, PA-C  02/20/2020, 12:57 PM

## 2020-02-20 NOTE — Consult Note (Signed)
Name: MIO SCHELLINGER MRN: [id number] DOB: 1935/10/03     CONSULTATION DATE: 02/20/2020  REFERRING MD : Olevia Bowens  CHIEF COMPLAINT: SOB  STUDIES:     CXR independently reviewed by Me today Lower lobe opacities   HISTORY OF PRESENT ILLNESS: 85 y.o. female with medical history significant of Hypertension, hyperlipidemia, diabetes mellitus, stroke, TIA, GERD, anxiety, PVD, CAD, myocardial infarction, former smoker, CKD stage III, GI bleeding, iron deficiency anemia, who presents with shortness of breath.  Patient states that her shortness breath started 2 days PTA which has been progressively worsening.   Per EMS, patient pt woke up with gasping for air, and rescue found pt's oxygen saturation at 79% on RA. Pt was given 125 mg of solumedrol and 1.5 duonebs, pt's oxygen saturation improved to 98% on NSR.  At arrival to ED, pt still has acute respiratory distress.  BiPAP was started.  Patient states that she has dry cough, no fever or chills.  She had some chest discomfort earlier, which has resolved.  Currently patient does not have chest pain.  Patient states that she has mild intermittent loose stool bowel movement recently, but currently no nausea, vomiting, diarrhea or abdominal pain.  No symptoms of UTI or unilateral weakness.  ED Course: pt was found to have BNP 600, troponin 67 -->143, WBC 17.2, negative Covid PCR, lactic acid 2.5, sodium 128, renal function close to baseline, temperature 97.7, blood pressure 165/56, heart rate 76, RR 27, oxygen saturation 99% on BiPAP.  Chest x-ray showed interstitial pulmonary edema and obesity bilateral basilar opacity.  Patient is admitted to PCU as inpatient.   4/27 AM Pt very SOB. Unable to lay flat due to SOB. Gave pt her inhaler and IV steroids. Still very SOB. O2 turned up to 7L to keep sats over 90% MD in to see pt.  0915 New orders for Bipap. Nitro and heparin and transfer to ICU.  1015 Report called to ICU. 12 lead EKG done. Nitro and  heparin infusing. Pt resting comfortably on Bipap.  She is alert and awake, following commands She feels much better on biPAP. +wheezing  PAST MEDICAL HISTORY :   has a past medical history of Allergies, Anxiety, Arthritis, Atherosclerosis of artery of extremity with rest pain ([place]) (10/12/2018), Cancer ([place]), Diabetes mellitus without complication ([place]), Heart murmur, Hyperlipidemia, Hypertension, Macula lutea degeneration, Mitral and aortic valve disease, Myocardial infarction ([place]), Occasional tremors, PAD (peripheral artery disease) ([place]), Shingles, Stroke ([place]) (01/2017), TIA (transient ischemic attack) (01/2017), UTI (urinary tract infection), and Vascular disease, peripheral ([place]).  has a past surgical history that includes Mitral valve replacement (08/2013); Mitral valve replacement (12/2012); Cardiac catheterization (1998); Tubal ligation; Cataract extraction, bilateral; Lower Extremity Angiography (Left, 08/23/2017); Eye surgery (Bilateral); Lower Extremity Angiography (Left, 07/26/2018); Colonoscopy with propofol (N/A, 08/12/2018); Lower Extremity Angiography (Left, 09/16/2018); and Endarterectomy femoral (Left, 10/12/2018). Prior to Admission medications   Medication Sig Start Date End Date Taking? Authorizing Provider  acetaminophen (TYLENOL) 650 MG CR tablet Take 1,300 mg by mouth every 8 (eight) hours as needed for pain.   Yes [provider]  baclofen (LIORESAL) 10 MG tablet TAKE 1 TABLET(10 MG) BY MOUTH THREE TIMES DAILY Patient taking differently: Take 10 mg by mouth daily as needed.  02/09/18  Yes [name], MD  chlorthalidone (HYGROTON) 25 MG tablet Take 25 mg by mouth daily.  01/03/20  Yes [provider]  clopidogrel (PLAVIX) 75 MG tablet TAKE 1 TABLET BY MOUTH EVERY DAY Patient taking differently: Take 37.5  mg by mouth daily.  02/14/20  Yes Schnier, Dolores Lory, MD  Cyanocobalamin (RA VITAMIN B-12 TR) 1000 MCG TBCR Take 1,000 mcg by mouth daily.    Yes  [provider]  diltiazem (CARTIA XT) 180 MG 24 hr capsule Take 180 mg by mouth 2 (two) times daily.    Yes [provider]  ferrous sulfate 325 (65 FE) MG tablet Take 325 mg by mouth daily with breakfast.   Yes [provider]  gabapentin (NEURONTIN) 100 MG capsule TAKE 1 TO 2 CAPSULES(100 TO 200 MG) BY MOUTH AT BEDTIME Patient taking differently: Take 100-200 mg by mouth at bedtime.  08/30/19  Yes [name], MD  insulin aspart (NOVOLOG FLEXPEN) 100 UNIT/ML FlexPen Inject 3 Units into the skin 3 (three) times daily with meals. At lunch Patient taking differently: Inject 2 Units into the skin daily. At breakfast 01/18/19  Yes [name], MD  Insulin Glargine (BASAGLAR KWIKPEN) 100 UNIT/ML SOPN Inject 0.12 mLs (12 Units total) into the skin daily. 06/27/19  Yes [name], MD  losartan (COZAAR) 50 MG tablet Take 50 mg by mouth daily.  01/03/20  Yes [provider]  metFORMIN (GLUCOPHAGE-XR) 500 MG 24 hr tablet TAKE 1 TABLET BY MOUTH TWICE DAILY 03/25/19  Yes [name], MD  Multiple Vitamins-Minerals (PRESERVISION AREDS PO) Take 1 capsule by mouth 2 (two) times daily.    Yes [provider]  nitroGLYCERIN (NITROSTAT) 0.4 MG SL tablet 1 tab under the tongue for CP, may repeat in 5 minutes up to 3 doses 09/07/18  Yes [name], MD  rosuvastatin (CRESTOR) 5 MG tablet Take 5 mg by mouth every other day. Mon, Wed, Fri and Sat. 03/02/18  Yes [provider]  B-D ULTRAFINE III SHORT PEN 31G X 8 MM MISC USE AS DIRECTED 12/19/18   [name], MD  feeding supplement, GLUCERNA SHAKE, (GLUCERNA SHAKE) LIQD Take 237 mLs by mouth 3 (three) times daily between meals. 12/28/19   Loletha Grayer, MD  pantoprazole (PROTONIX) 40 MG tablet Take 1 tablet (40 mg total) by mouth daily. Patient not taking: Reported on 02/18/2020 12/29/19   Loletha Grayer, MD   Allergies  Allergen Reactions  . Saxagliptin Diarrhea  . Epinephrine Other  (See Comments)    Patient does not remember what happens when she uses this  . Atorvastatin Other (See Comments)    Muscle aches  . Codeine Other (See Comments)    Upset stomach  . Ezetimibe Other (See Comments)    Myalgias(ZETIA)  . Limonene Rash    Patient does not recall this reaction  . Nitrofurantoin Rash and Other (See Comments)    Pruitus  . Sulfa Antibiotics Rash and Other (See Comments)    Sore mouth     FAMILY HISTORY:  family history includes Dementia in her mother; Diabetes in her father. SOCIAL HISTORY:  reports that she quit smoking about 41 years ago. Her smoking use included cigarettes. She has a 74.00 pack-year smoking history. She has never used smokeless tobacco. She reports that she does not drink alcohol or use drugs.    Review of Systems:  Gen:  Denies  fever, sweats, chills weigh loss  HEENT: Denies blurred vision, double vision, ear pain, eye pain, hearing loss, nose bleeds, sore throat Cardiac:  No dizziness, chest pain or heaviness, chest tightness,edema, No JVD Resp:   No cough, -sputum production, +shortness of breath,+wheezing, -hemoptysis,  Gi: Denies swallowing difficulty, stomach pain, nausea or  vomiting, diarrhea, constipation, bowel incontinence Other:  All other systems negative     VITAL SIGNS: Temp:  [97.5 F (36.4 C)-98.4 F (36.9 C)] 97.8 F (36.6 C) (04/27 0757) Pulse Rate:  [81-100] 86 (04/27 1000) Resp:  [17-28] 18 (04/27 1000) BP: (99-131)/(39-88) 111/39 (04/27 1000) SpO2:  [76 %-100 %] 96 % (04/27 1000) Weight:  [56.7 kg] 56.7 kg (04/27 0413)     SpO2: 96 % O2 Flow Rate (L/min): 3 L/min FiO2 (%): 50 %    Physical Examination:   GENERAL:resp distress better on biPAP HEAD: Normocephalic, atraumatic.  EYES: PERLA, EOMI No scleral icterus.  MOUTH: Moist mucosal membrane.  EAR, NOSE, THROAT: Clear without exudates. No external lesions.  NECK: Supple.  PULMONARY: CTA B/L +wheezing CARDIOVASCULAR: S1 and S2. Regular  rate and rhythm. No murmurs GASTROINTESTINAL: Soft, nontender, nondistended. Positive bowel sounds.  MUSCULOSKELETAL: +edema.  NEUROLOGIC: No gross focal neurological deficits. 5/5 strength all extremities SKIN: No ulceration, lesions, rashes, or cyanosis.  PSYCHIATRIC: Insight, judgment intact. -depression -anxiety ALL OTHER ROS ARE NEGATIVE   MEDICATIONS: I have reviewed all medications and confirmed regimen as documented    CULTURE RESULTS   Recent Results (from the past 240 hour(s))  Respiratory Panel by RT PCR (Flu A&B, Covid) - Nasopharyngeal Swab     Status: None   Collection Time: 02/18/20  5:17 AM   Specimen: Nasopharyngeal Swab  Result Value Ref Range Status   SARS Coronavirus 2 by RT PCR NEGATIVE NEGATIVE Final    Comment: (NOTE) SARS-CoV-2 target nucleic acids are NOT DETECTED. The SARS-CoV-2 RNA is generally detectable in upper respiratoy specimens during the acute phase of infection. The lowest concentration of SARS-CoV-2 viral copies this assay can detect is 131 copies/mL. A negative result does not preclude SARS-Cov-2 infection and should not be used as the sole basis for treatment or other patient management decisions. A negative result may occur with  improper specimen collection/handling, submission of specimen other than nasopharyngeal swab, presence of viral mutation(s) within the areas targeted by this assay, and inadequate number of viral copies (<131 copies/mL). A negative result must be combined with clinical observations, patient history, and epidemiological information. The expected result is Negative. Fact Sheet for Patients:  PinkCheek.be Fact Sheet for Healthcare Providers:  GravelBags.it This test is not yet ap proved or cleared by the [place] FDA and  has been authorized for detection and/or diagnosis of SARS-CoV-2 by FDA under an Emergency Use Authorization (EUA). This EUA will  remain  in effect (meaning this test can be used) for the duration of the COVID-19 declaration under Section 564(b)(1) of the Act, 21 U.S.C. section 360bbb-3(b)(1), unless the authorization is terminated or revoked sooner.    Influenza A by PCR NEGATIVE NEGATIVE Final   Influenza B by PCR NEGATIVE NEGATIVE Final    Comment: (NOTE) The Xpert Xpress SARS-CoV-2/FLU/RSV assay is intended as an aid in  the diagnosis of influenza from Nasopharyngeal swab specimens and  should not be used as a sole basis for treatment. Nasal washings and  aspirates are unacceptable for Xpert Xpress SARS-CoV-2/FLU/RSV  testing. Fact Sheet for Patients: PinkCheek.be Fact Sheet for Healthcare Providers: GravelBags.it This test is not yet approved or cleared by the [place] FDA and  has been authorized for detection and/or diagnosis of SARS-CoV-2 by  FDA under an Emergency Use Authorization (EUA). This EUA will remain  in effect (meaning this test can be used) for the duration of the  Covid-19 declaration under Section 564(b)(1) of the  Act, 21  U.S.C. section 360bbb-3(b)(1), unless the authorization is  terminated or revoked. Performed at [facility], [address] [place], [place] [address]   Blood culture (routine x 2)     Status: None (Preliminary result)   Collection Time: 02/18/20  6:29 AM   Specimen: BLOOD  Result Value Ref Range Status   Specimen Description BLOOD BLOOD LEFT HAND  Final   Special Requests   Final    BOTTLES DRAWN AEROBIC AND ANAEROBIC Blood Culture results may not be optimal due to an excessive volume of blood received in culture bottles   Culture   Final    NO GROWTH 2 DAYS Performed at [facility], [address] [place], [place] [address]    Report Status PENDING  Incomplete  Blood culture (routine x 2)     Status: None (Preliminary result)   Collection Time: 02/18/20  6:29 AM   Specimen:  BLOOD  Result Value Ref Range Status   Specimen Description BLOOD LEFT ANTECUBITAL  Final   Special Requests   Final    BOTTLES DRAWN AEROBIC AND ANAEROBIC Blood Culture adequate volume   Culture   Final    NO GROWTH 2 DAYS Performed at [facility], [address], [place], [place] [address]    Report Status PENDING  Incomplete  Urine culture     Status: None   Collection Time: 02/18/20 11:23 AM   Specimen: Urine, Random  Result Value Ref Range Status   Specimen Description   Final    URINE, RANDOM Performed at [facility], [address] [place], [place] [address]    Special Requests   Final    NONE Performed at [facility], [address], [place], [place] [address]    Culture   Final    NO GROWTH Performed at [facility], [place] [address], [place], [place] [address]    Report Status 02/19/2020 FINAL  Final           ASSESSMENT AND PLAN SYNOPSIS  SEVERE COPD EXACERBATION due to NSTEMI, possible atypical pneumonia -continue IV steroids as prescribed increase to 40 BID -continue NEB THERAPY as prescribed -morphine as needed -wean fio2 as needed and tolerated o2 sats 88-92 % Patient is former smoker Start Z pak   resp failure Tolerating BiPAP Wean fio2 as tolerated   ACUTE NSTEMI Follow up cardiology recs On Nitro infusion and Heparin drip  Recommend palliative care consultation to address goals of care.  Patient can remain on Progressive Cardiac Unit.  No need to transfer to ICU at this time  Patient  satisfied with Plan of action and management. All questions answered  Corrin Parker, M.D.  [facility]  Medical Director [place] Director [facility] Cardio-Pulmonary Department

## 2020-02-21 ENCOUNTER — Inpatient Hospital Stay: Payer: Medicare Other

## 2020-02-21 DIAGNOSIS — N183 Chronic kidney disease, stage 3 unspecified: Secondary | ICD-10-CM

## 2020-02-21 DIAGNOSIS — I5041 Acute combined systolic (congestive) and diastolic (congestive) heart failure: Secondary | ICD-10-CM

## 2020-02-21 DIAGNOSIS — J441 Chronic obstructive pulmonary disease with (acute) exacerbation: Secondary | ICD-10-CM

## 2020-02-21 DIAGNOSIS — J449 Chronic obstructive pulmonary disease, unspecified: Secondary | ICD-10-CM

## 2020-02-21 DIAGNOSIS — I5031 Acute diastolic (congestive) heart failure: Secondary | ICD-10-CM

## 2020-02-21 LAB — CBC WITH DIFFERENTIAL/PLATELET
Abs Immature Granulocytes: 0.39 10*3/uL — ABNORMAL HIGH (ref 0.00–0.07)
Basophils Absolute: 0 10*3/uL (ref 0.0–0.1)
Basophils Relative: 0 %
Eosinophils Absolute: 0 10*3/uL (ref 0.0–0.5)
Eosinophils Relative: 0 %
HCT: 29.7 % — ABNORMAL LOW (ref 36.0–46.0)
Hemoglobin: 9.4 g/dL — ABNORMAL LOW (ref 12.0–15.0)
Immature Granulocytes: 2 %
Lymphocytes Relative: 8 %
Lymphs Abs: 1.6 10*3/uL (ref 0.7–4.0)
MCH: 29 pg (ref 26.0–34.0)
MCHC: 31.6 g/dL (ref 30.0–36.0)
MCV: 91.7 fL (ref 80.0–100.0)
Monocytes Absolute: 1 10*3/uL (ref 0.1–1.0)
Monocytes Relative: 5 %
Neutro Abs: 16.9 10*3/uL — ABNORMAL HIGH (ref 1.7–7.7)
Neutrophils Relative %: 85 %
Platelets: 302 10*3/uL (ref 150–400)
RBC: 3.24 MIL/uL — ABNORMAL LOW (ref 3.87–5.11)
RDW: 14 % (ref 11.5–15.5)
WBC: 19.9 10*3/uL — ABNORMAL HIGH (ref 4.0–10.5)
nRBC: 0 % (ref 0.0–0.2)

## 2020-02-21 LAB — BASIC METABOLIC PANEL
Anion gap: 13 (ref 5–15)
BUN: 93 mg/dL — ABNORMAL HIGH (ref 8–23)
CO2: 18 mmol/L — ABNORMAL LOW (ref 22–32)
Calcium: 9 mg/dL (ref 8.9–10.3)
Chloride: 96 mmol/L — ABNORMAL LOW (ref 98–111)
Creatinine, Ser: 2.72 mg/dL — ABNORMAL HIGH (ref 0.44–1.00)
GFR calc Af Amer: 18 mL/min — ABNORMAL LOW (ref >60)
GFR calc non Af Amer: 16 mL/min — ABNORMAL LOW (ref >60)
Glucose, Bld: 237 mg/dL — ABNORMAL HIGH (ref 70–99)
Potassium: 5.5 mmol/L — ABNORMAL HIGH (ref 3.5–5.1)
Sodium: 127 mmol/L — ABNORMAL LOW (ref 135–145)

## 2020-02-21 LAB — HEPARIN LEVEL (UNFRACTIONATED)
Heparin Unfractionated: 0.55 IU/mL (ref 0.30–0.70)
Heparin Unfractionated: 0.4 IU/mL (ref 0.30–0.70)

## 2020-02-21 LAB — HEPATIC FUNCTION PANEL
ALT: 50 U/L — ABNORMAL HIGH (ref 0–44)
AST: 33 U/L (ref 15–41)
Albumin: 3.5 g/dL (ref 3.5–5.0)
Alkaline Phosphatase: 78 U/L (ref 38–126)
Bilirubin, Direct: <0.1 mg/dL (ref 0.0–0.2)
Total Bilirubin: 0.7 mg/dL (ref 0.3–1.2)
Total Protein: 6.1 g/dL — ABNORMAL LOW (ref 6.5–8.1)

## 2020-02-21 LAB — GLUCOSE, CAPILLARY
Glucose-Capillary: 222 mg/dL — ABNORMAL HIGH (ref 70–99)
Glucose-Capillary: 274 mg/dL — ABNORMAL HIGH (ref 70–99)
Glucose-Capillary: 190 mg/dL — ABNORMAL HIGH (ref 70–99)
Glucose-Capillary: 234 mg/dL — ABNORMAL HIGH (ref 70–99)

## 2020-02-21 MED ORDER — SODIUM CHLORIDE 0.9 % IV SOLN: INTRAVENOUS | Status: DC

## 2020-02-21 MED ORDER — SODIUM ZIRCONIUM CYCLOSILICATE 10 G PO PACK
10.0000 g | PACK | Freq: Two times a day (BID) | ORAL | Status: DC
  Administered 2020-02-21: 10 g via ORAL
  Filled 2020-02-21 (×2): qty 1

## 2020-02-21 MED ORDER — CALCIUM GLUCONATE-NACL 1-0.675 GM/50ML-% IV SOLN
1.0000 g | Freq: Once | INTRAVENOUS | Status: AC
  Administered 2020-02-21: 1000 mg via INTRAVENOUS
  Filled 2020-02-21: qty 50

## 2020-02-21 MED ORDER — FUROSEMIDE 10 MG/ML IJ SOLN
20.0000 mg | Freq: Two times a day (BID) | INTRAMUSCULAR | Status: DC
  Administered 2020-02-21 (×2): 20 mg via INTRAVENOUS
  Filled 2020-02-21 (×2): qty 2

## 2020-02-21 NOTE — Progress Notes (Signed)
Requesting to come off BIPAP at this time.  Nasal cannula at 3L applied.  Anxious with repositioning in bed. Klonopin offered.

## 2020-02-21 NOTE — Consult Note (Signed)
[facility]  CONSULT NOTE    Date: 02/21/2020                  [name]  MRN: [id number]  DOB: [birth year]  Age / Sex: 85 y.o., female         PCP: [name], MD                 Service Requesting Consult: Dr. Earleen Newport                 Reason for Consult: Acute renal failure            History of Present Illness: Jill Hudson admitted to [facility] with Hyperglycemia [R73.9] Acute CHF (congestive heart failure) ([place]) [I50.9] Acute respiratory failure with hypoxia ([place]) [J96.01] Community acquired pneumonia, unspecified laterality [J18.9] Acute on chronic congestive heart failure, unspecified heart failure type ([place]) [I50.9]   Patient's daughter is at bedside who assists with history taking. Patient with progressive shortness of breath with wheezing and chest pain. Outpatient diuretics of chlorthalidone. Patient states that she does not have any peripheral edema.    Medications: Outpatient medications: Medications Prior to Admission  Medication Sig Dispense Refill Last Dose  . acetaminophen (TYLENOL) 650 MG CR tablet Take 1,300 mg by mouth every 8 (eight) hours as needed for pain.   prn at prn  . baclofen (LIORESAL) 10 MG tablet TAKE 1 TABLET(10 MG) BY MOUTH THREE TIMES DAILY (Patient taking differently: Take 10 mg by mouth daily as needed. ) 270 tablet 0 prn at prn  . chlorthalidone (HYGROTON) 25 MG tablet Take 25 mg by mouth daily.    Unknown at Unknown  . clopidogrel (PLAVIX) 75 MG tablet TAKE 1 TABLET BY MOUTH EVERY DAY (Patient taking differently: Take 37.5 mg by mouth daily. ) 30 tablet 1 Unknown at Unknown  . Cyanocobalamin (RA VITAMIN B-12 TR) 1000 MCG TBCR Take 1,000 mcg by mouth daily.    Unknown at Unknown  . diltiazem (CARTIA XT) 180 MG 24 hr capsule Take 180 mg by mouth 2 (two) times daily.    Unknown at Unknown  . ferrous sulfate 325 (65 FE) MG tablet Take 325 mg by mouth daily with breakfast.   Unknown at Unknown  .  gabapentin (NEURONTIN) 100 MG capsule TAKE 1 TO 2 CAPSULES(100 TO 200 MG) BY MOUTH AT BEDTIME (Patient taking differently: Take 100-200 mg by mouth at bedtime. ) 180 capsule 6 Unknown at Unknown  . insulin aspart (NOVOLOG FLEXPEN) 100 UNIT/ML FlexPen Inject 3 Units into the skin 3 (three) times daily with meals. At lunch (Patient taking differently: Inject 2 Units into the skin daily. At breakfast) 15 mL 3 Unknown at Unknown  . Insulin Glargine (BASAGLAR KWIKPEN) 100 UNIT/ML SOPN Inject 0.12 mLs (12 Units total) into the skin daily. 15 mL 3 Unknown at Unknown  . losartan (COZAAR) 50 MG tablet Take 50 mg by mouth daily.    Unknown at Unknown  . metFORMIN (GLUCOPHAGE-XR) 500 MG 24 hr tablet TAKE 1 TABLET BY MOUTH TWICE DAILY 60 tablet 11 Unknown at Unknown  . Multiple Vitamins-Minerals (PRESERVISION AREDS PO) Take 1 capsule by mouth 2 (two) times daily.    Unknown at Unknown  . nitroGLYCERIN (NITROSTAT) 0.4 MG SL tablet 1 tab under the tongue for CP, may repeat in 5 minutes up to 3 doses 25 tablet 5 prn at prn  . rosuvastatin (CRESTOR) 5 MG tablet Take 5 mg by  mouth every other day. Mon, Wed, Fri and Sat.   Unknown at Unknown  . B-D ULTRAFINE III SHORT PEN 31G X 8 MM MISC USE AS DIRECTED 100 each 3   . feeding supplement, GLUCERNA SHAKE, (GLUCERNA SHAKE) LIQD Take 237 mLs by mouth 3 (three) times daily between meals. 21330 mL 0   . pantoprazole (PROTONIX) 40 MG tablet Take 1 tablet (40 mg total) by mouth daily. (Patient not taking: Reported on 02/18/2020) 30 tablet 0 Not Taking at Unknown    Current medications: Current Facility-Administered Medications  Medication Dose Route Frequency Provider Last Rate Last Admin  . acetaminophen (TYLENOL) tablet 650 mg  650 mg Oral Q6H PRN Charlynne Cousins, MD      . albuterol (PROVENTIL) (2.5 MG/3ML) 0.083% nebulizer solution 2.5 mg  2.5 mg Inhalation Q4H PRN Charlynne Cousins, MD      . azithromycin [facility]) tablet 250 mg  250 mg Oral Daily Flora Lipps, MD      . baclofen (LIORESAL) tablet 10 mg  10 mg Oral Daily PRN Charlynne Cousins, MD      . budesonide (PULMICORT) nebulizer solution 0.5 mg  0.5 mg Nebulization BID Flora Lipps, MD   0.5 mg at 02/21/20 0714  . clonazepam (KLONOPIN) disintegrating tablet 0.125 mg  0.125 mg Oral BID PRN Charlynne Cousins, MD   0.125 mg at 02/21/20 0531  . clopidogrel (PLAVIX) tablet 37.5 mg  37.5 mg Oral Daily Charlynne Cousins, MD   37.5 mg at 02/21/20 0947  . dextromethorphan-guaiFENesin (MUCINEX DM) 30-600 MG per 12 hr tablet 1 tablet  1 tablet Oral BID PRN Charlynne Cousins, MD      . feeding supplement (GLUCERNA SHAKE) (GLUCERNA SHAKE) liquid 237 mL  237 mL Oral TID BM Charlynne Cousins, MD   237 mL at 02/21/20 1226  . ferrous sulfate tablet 325 mg  325 mg Oral Q breakfast Charlynne Cousins, MD   325 mg at 02/21/20 0947  . furosemide (LASIX) injection 20 mg  20 mg Intravenous Q12H Tavis Kring, MD   20 mg at 02/21/20 1042  . gabapentin (NEURONTIN) capsule 100 mg  100 mg Oral QHS Flora Lipps, MD   100 mg at 02/20/20 2056  . heparin ADULT infusion 100 units/mL (25000 units/276mL sodium chloride 0.45%)  550 Units/hr Intravenous Continuous Charlynne Cousins, MD 5.5 mL/hr at 02/21/20 0415 550 Units/hr at 02/21/20 0415  . hydrALAZINE (APRESOLINE) injection 5 mg  5 mg Intravenous Q2H PRN Charlynne Cousins, MD      . insulin aspart (novoLOG) injection 0-5 Units  0-5 Units Subcutaneous QHS Charlynne Cousins, MD   2 Units at 02/20/20 2114  . insulin aspart (novoLOG) injection 0-9 Units  0-9 Units Subcutaneous TID WC Charlynne Cousins, MD   5 Units at 02/21/20 1225  . insulin aspart (novoLOG) injection 3 Units  3 Units Subcutaneous TID WC Charlynne Cousins, MD   3 Units at 02/21/20 1225  . insulin glargine (LANTUS) injection 10 Units  10 Units Subcutaneous BID Charlynne Cousins, MD   10 Units at 02/21/20 [phone]  . ipratropium-albuterol (DUONEB) 0.5-2.5 (3) MG/3ML nebulizer  solution 3 mL  3 mL Nebulization Q4H Flora Lipps, MD   3 mL at 02/21/20 1110  . melatonin tablet 2.5 mg  2.5 mg Oral QHS Charlynne Cousins, MD   2.5 mg at 02/20/20 2055  . methylPREDNISolone sodium succinate (SOLU-MEDROL) 40 mg/mL injection 40 mg  40 mg Intravenous Q12H  Flora Lipps, MD   40 mg at 02/21/20 0951  . metoprolol tartrate (LOPRESSOR) tablet 25 mg  25 mg Oral BID Charlynne Cousins, MD   25 mg at 02/21/20 0947  . morphine 2 MG/ML injection 2 mg  2 mg Intravenous Q2H PRN Charlynne Cousins, MD   2 mg at 02/21/20 0001  . multivitamin-lutein (OCUVITE-LUTEIN) capsule 1 capsule  1 capsule Oral BID Charlynne Cousins, MD   1 capsule at 02/21/20 0946  . nitroGLYCERIN (NITROSTAT) SL tablet 0.4 mg  0.4 mg Sublingual Q5 min PRN Charlynne Cousins, MD      . ondansetron [facility]) injection 4 mg  4 mg Intravenous Q8H PRN Charlynne Cousins, MD      . rosuvastatin (CRESTOR) tablet 5 mg  5 mg Oral Once per day on Mon Wed Fri Sat Charlynne Cousins, MD   5 mg at 02/21/20 0955  . sodium chloride flush (NS) 0.9 % injection 3 mL  3 mL Intravenous Q12H Charlynne Cousins, MD   3 mL at 02/21/20 0001      Allergies: Allergies  Allergen Reactions  . Saxagliptin Diarrhea  . Epinephrine Other (See Comments)    Patient does not remember what happens when she uses this  . Atorvastatin Other (See Comments)    Muscle aches  . Codeine Other (See Comments)    Upset stomach  . Ezetimibe Other (See Comments)    Myalgias(ZETIA)  . Limonene Rash    Patient does not recall this reaction  . Nitrofurantoin Rash and Other (See Comments)    Pruitus  . Sulfa Antibiotics Rash and Other (See Comments)    Sore mouth       Past Medical History: Past Medical History:  Diagnosis Date  . Allergies   . Anxiety   . Arthritis    spine and shoulder  . Atherosclerosis of artery of extremity with rest pain ([place]) 10/12/2018  . Cancer ([place])    skin  . Diabetes mellitus without complication ([place])    . Heart murmur   . Hyperlipidemia   . Hypertension   . Macula lutea degeneration   . Mitral and aortic valve disease   . Myocardial infarction [facility])    may have had a "light" heart attack  . Occasional tremors   . PAD (peripheral artery disease) ([place])   . Shingles    patient unaware but daughter confirms. it was a long time ago  . Stroke [facility]) 01/2017   may have had a slight stroke  . TIA (transient ischemic attack) 01/2017  . UTI (urinary tract infection)   . Vascular disease, peripheral [facility])      Past Surgical History: Past Surgical History:  Procedure Laterality Date  . CARDIAC CATHETERIZATION  1998   40% LM, 95% Ramus interm  . CATARACT EXTRACTION, BILATERAL    . COLONOSCOPY WITH PROPOFOL N/A 08/12/2018   Procedure: COLONOSCOPY WITH PROPOFOL;  Surgeon: Lucilla Lame, MD;  Location: [facility] ENDOSCOPY;  Service: Endoscopy;  Laterality: N/A;  . ENDARTERECTOMY FEMORAL Left 10/12/2018   Procedure: ENDARTERECTOMY FEMORAL;  Surgeon: Katha Cabal, MD;  Location: [facility] ORS;  Service: Vascular;  Laterality: Left;  angioplasty and left SFA stent placement  . EYE SURGERY Bilateral    cataract extractions  . LOWER EXTREMITY ANGIOGRAPHY Left 08/23/2017   Procedure: Lower Extremity Angiography;  Surgeon: Algernon Huxley, MD;  Location: [place] CV LAB;  Service: Cardiovascular;  Laterality: Left;  . LOWER EXTREMITY ANGIOGRAPHY Left 07/26/2018  Procedure: LOWER EXTREMITY ANGIOGRAPHY;  Surgeon: Katha Cabal, MD;  Location: [place] CV LAB;  Service: Cardiovascular;  Laterality: Left;  . LOWER EXTREMITY ANGIOGRAPHY Left 09/16/2018   Procedure: LOWER EXTREMITY ANGIOGRAPHY;  Surgeon: Katha Cabal, MD;  Location: [place] CV LAB;  Service: Cardiovascular;  Laterality: Left;  . PTCA  08/2013   Left common iliac  . PTCA  12/2012   left ext iliac  . TUBAL LIGATION       Family History: Family History  Problem Relation Age of Onset  . Dementia Mother   .  Diabetes Father      Social History: Social History   Socioeconomic History  . Marital status: Married    Spouse name: kermit  . Number of children: 3  . Years of education: Not on file  . Highest education level: 12th grade  Occupational History  . Occupation: Retired    Comment: homemaker  Tobacco Use  . Smoking status: Former Smoker    Packs/day: 2.00    Years: 37.00    Pack years: 74.00    Types: Cigarettes    Quit date: 1980    Years since quitting: 41.3  . Smokeless tobacco: Never Used  . Tobacco comment: smoking cessation materials not required  Substance and Sexual Activity  . Alcohol use: No    Alcohol/week: 0.0 standard drinks  . Drug use: No  . Sexual activity: Not Currently  Other Topics Concern  . Not on file  Social History Narrative   Husband has dementia and may wander. Requires constant supervision. Pt's daughter also lives with them. Her son Ollen Gross is a Garment/textile technologist.    Social Determinants of Health   Financial Resource Strain:   . Difficulty of Paying Living Expenses:   Food Insecurity: No Food Insecurity  . Worried About Charity fundraiser in the Last Year: Never true  . Ran Out of Food in the Last Year: Never true  Transportation Needs: No Transportation Needs  . Lack of Transportation (Medical): No  . Lack of Transportation (Non-Medical): No  Physical Activity:   . Days of Exercise per Week:   . Minutes of Exercise per Session:   Stress: No Stress Concern Present  . Feeling of Stress : Not at all  Social Connections:   . Frequency of Communication with Friends and Family:   . Frequency of Social Gatherings with Friends and Family:   . Attends Religious Services:   . Active Member of Clubs or Organizations:   . Attends Archivist Meetings:   Marland Kitchen Marital Status:   Intimate Partner Violence: Not At Risk  . Fear of Current or Ex-Partner: No  . Emotionally Abused: No  . Physically Abused: No  . Sexually Abused: No     Review of  Systems: Review of Systems  Constitutional: Negative.  Negative for chills, diaphoresis, fever, malaise/fatigue and weight loss.  HENT: Negative.  Negative for congestion, ear discharge, ear pain, hearing loss, nosebleeds, sinus pain, sore throat and tinnitus.   Eyes: Negative.  Negative for blurred vision, double vision, photophobia, pain, discharge and redness.  Respiratory: Positive for cough, shortness of breath and wheezing. Negative for hemoptysis, sputum production and stridor.   Cardiovascular: Positive for PND. Negative for chest pain, palpitations, orthopnea, claudication and leg swelling.  Gastrointestinal: Negative.  Negative for abdominal pain, blood in stool, constipation, diarrhea, heartburn, melena, nausea and vomiting.  Genitourinary: Negative.  Negative for dysuria, flank pain, frequency, hematuria and urgency.  Musculoskeletal: Negative.  Negative for back pain, falls, joint pain, myalgias and neck pain.  Skin: Negative.  Negative for itching and rash.  Neurological: Negative.  Negative for dizziness, tingling, tremors, sensory change, speech change, focal weakness, seizures, loss of consciousness, weakness and headaches.  Endo/Heme/Allergies: Negative.  Negative for environmental allergies and polydipsia. Does not bruise/bleed easily.  Psychiatric/Behavioral: Negative.  Negative for depression, hallucinations, memory loss, substance abuse and suicidal ideas. The patient is not nervous/anxious and does not have insomnia.     Vital Signs: Blood pressure (!) 132/53, pulse 85, temperature 98.2 F (36.8 C), temperature source Oral, resp. rate (!) 22, height 5' (1.524 m), weight 57.8 kg, SpO2 94 %.  Weight trends: Filed Weights   02/19/20 0141 02/20/20 0413 02/21/20 0401  Weight: 55 kg 56.7 kg 57.8 kg    Physical Exam: General: NAD, laying in bed  Head: Normocephalic, atraumatic. Moist oral mucosal membranes  Eyes: Anicteric, PERRL  Neck: Supple, trachea midline  Lungs:   +wheezing and crackles bilaterally  Heart: Regular rate and rhythm  Abdomen:  Soft, nontender,   Extremities: trace peripheral edema.  Neurologic: Nonfocal, moving all four extremities  Skin: No lesions         Lab results: Basic Metabolic Panel: Recent Labs  Lab 02/19/20 0506 02/20/20 0411 02/21/20 0545  NA 129* 127* 127*  K 5.1 4.8 5.5*  CL 98 96* 96*  CO2 20* 17* 18*  GLUCOSE 206* 232* 237*  BUN 57* 77* 93*  CREATININE 1.85* 2.33* 2.72*  CALCIUM 9.0 9.2 9.0  MG 2.1  --   --     Liver Function Tests: No results for input(s): AST, ALT, ALKPHOS, BILITOT, PROT, ALBUMIN in the last 168 hours. No results for input(s): LIPASE, AMYLASE in the last 168 hours. No results for input(s): AMMONIA in the last 168 hours.  CBC: Recent Labs  Lab 02/18/20 0517 02/18/20 0517 02/19/20 0506 02/20/20 0917 02/21/20 0545  WBC 17.2*   < > 15.3* 25.2* 19.9*  NEUTROABS 13.3*  --   --  22.5* 16.9*  HGB 10.2*   < > 9.3* 9.3* 9.4*  HCT 31.7*   < > 28.0* 29.1* 29.7*  MCV 91.4  --  90.0 92.4 91.7  PLT 299   < > 268 339 302   < > = values in this interval not displayed.    Cardiac Enzymes: No results for input(s): CKTOTAL, CKMB, CKMBINDEX, TROPONINI in the last 168 hours.  BNP: Invalid input(s): POCBNP  CBG: Recent Labs  Lab 02/20/20 1603 02/20/20 2008 02/20/20 2332 02/21/20 0751 02/21/20 1205  GLUCAP 234* 218* 181* 222* 274*    Microbiology: Results for orders placed or performed during the hospital encounter of 02/18/20  Respiratory Panel by RT PCR (Flu A&B, Covid) - Nasopharyngeal Swab     Status: None   Collection Time: 02/18/20  5:17 AM   Specimen: Nasopharyngeal Swab  Result Value Ref Range Status   SARS Coronavirus 2 by RT PCR NEGATIVE NEGATIVE Final    Comment: (NOTE) SARS-CoV-2 target nucleic acids are NOT DETECTED. The SARS-CoV-2 RNA is generally detectable in upper respiratoy specimens during the acute phase of infection. The lowest concentration of  SARS-CoV-2 viral copies this assay can detect is 131 copies/mL. A negative result does not preclude SARS-Cov-2 infection and should not be used as the sole basis for treatment or other patient management decisions. A negative result may occur with  improper specimen collection/handling, submission of specimen other than nasopharyngeal swab, presence of viral mutation(s) within the  areas targeted by this assay, and inadequate number of viral copies (<131 copies/mL). A negative result must be combined with clinical observations, patient history, and epidemiological information. The expected result is Negative. Fact Sheet for Patients:  PinkCheek.be Fact Sheet for Healthcare Providers:  GravelBags.it This test is not yet ap proved or cleared by the [place] FDA and  has been authorized for detection and/or diagnosis of SARS-CoV-2 by FDA under an Emergency Use Authorization (EUA). This EUA will remain  in effect (meaning this test can be used) for the duration of the COVID-19 declaration under Section 564(b)(1) of the Act, 21 U.S.C. section 360bbb-3(b)(1), unless the authorization is terminated or revoked sooner.    Influenza A by PCR NEGATIVE NEGATIVE Final   Influenza B by PCR NEGATIVE NEGATIVE Final    Comment: (NOTE) The Xpert Xpress SARS-CoV-2/FLU/RSV assay is intended as an aid in  the diagnosis of influenza from Nasopharyngeal swab specimens and  should not be used as a sole basis for treatment. Nasal washings and  aspirates are unacceptable for Xpert Xpress SARS-CoV-2/FLU/RSV  testing. Fact Sheet for Patients: PinkCheek.be Fact Sheet for Healthcare Providers: GravelBags.it This test is not yet approved or cleared by the [place] FDA and  has been authorized for detection and/or diagnosis of SARS-CoV-2 by  FDA under an Emergency Use Authorization (EUA).  This EUA will remain  in effect (meaning this test can be used) for the duration of the  Covid-19 declaration under Section 564(b)(1) of the Act, 21  U.S.C. section 360bbb-3(b)(1), unless the authorization is  terminated or revoked. Performed at [facility], [address] [place], [place] [address]   Blood culture (routine x 2)     Status: None (Preliminary result)   Collection Time: 02/18/20  6:29 AM   Specimen: BLOOD  Result Value Ref Range Status   Specimen Description BLOOD BLOOD LEFT HAND  Final   Special Requests   Final    BOTTLES DRAWN AEROBIC AND ANAEROBIC Blood Culture results may not be optimal due to an excessive volume of blood received in culture bottles   Culture   Final    NO GROWTH 3 DAYS Performed at [facility], [address], [place], [place] [address]    Report Status PENDING  Incomplete  Blood culture (routine x 2)     Status: None (Preliminary result)   Collection Time: 02/18/20  6:29 AM   Specimen: BLOOD  Result Value Ref Range Status   Specimen Description BLOOD LEFT ANTECUBITAL  Final   Special Requests   Final    BOTTLES DRAWN AEROBIC AND ANAEROBIC Blood Culture adequate volume   Culture   Final    NO GROWTH 3 DAYS Performed at [facility], [address], [place], [place] [address]    Report Status PENDING  Incomplete  Urine culture     Status: None   Collection Time: 02/18/20 11:23 AM   Specimen: Urine, Random  Result Value Ref Range Status   Specimen Description   Final    URINE, RANDOM Performed at [facility], [address] [place], [place] [address]    Special Requests   Final    NONE Performed at [facility], [address] [place], [place] [address]    Culture   Final    NO GROWTH Performed at [facility], [place] [address], [place], [place] [address]    Report Status 02/19/2020 FINAL  Final    Coagulation Studies: Recent Labs  02/20/20 0932  LABPROT 15.4*  INR  1.2    Urinalysis: No results for input(s): COLORURINE, LABSPEC, PHURINE, GLUCOSEU, HGBUR, BILIRUBINUR, KETONESUR, PROTEINUR, UROBILINOGEN, NITRITE, LEUKOCYTESUR in the last 72 hours.  Invalid input(s): APPERANCEUR    Imaging: DG Chest 1 View  Result Date: 02/20/2020 CLINICAL DATA:  Dyspnea. EXAM: CHEST  1 VIEW COMPARISON:  February 18, 2020. FINDINGS: Stable cardiomediastinal silhouette. Atherosclerosis of thoracic aorta is noted. No pneumothorax is noted. Stable bibasilar opacities are noted concerning for edema with associated pleural effusions. Bony thorax is unremarkable. IMPRESSION: Aortic atherosclerosis. Stable bibasilar opacities are noted concerning for edema with associated pleural effusions. Electronically Signed   By: Marijo Conception M.D.   On: 02/20/2020 10:50   US RENAL  Result Date: 02/21/2020 CLINICAL DATA:  Acute kidney injury EXAM: RENAL / URINARY TRACT ULTRASOUND COMPLETE COMPARISON:  None. FINDINGS: Right Kidney: Renal measurements: 9.1 x 4.1 x 4.7 cm = volume: 92 mL . Echogenicity is slightly increased. Renal cortical thickness is within normal limits. No mass, perinephric fluid, or hydronephrosis visualized. No sonographically demonstrable calculus or ureterectasis. Left Kidney: Renal measurements: 10.4 x 4.7 x 4.5 cm = volume: 112 mL. Echogenicity is slightly increased. Renal cortical thickness normal. No perinephric fluid or hydronephrosis visualized. There is a cyst in the upper pole left kidney measuring 3.3 x 2.9 x 2.5 cm. A second cyst in this area measures 2.5 x 1.8 x 1.4 cm. A cyst in the lower pole region on the left measures 1.6 x 1.3 x 1.2 cm. Bladder: Appears normal for degree of bladder distention. Other: Bilateral pleural effusions noted. IMPRESSION: 1. Renal echogenicity mildly increased bilaterally, a finding that may be indicative of a degree of medical renal disease. Renal cortical thickness within normal limits bilaterally. No obstructing focus in either kidney.  2.  There are cysts in the left kidney. 3.  Bilateral pleural effusions. Electronically Signed   By: Lowella Grip III M.D.   On: 02/21/2020 11:02   US Venous Img Lower Bilateral (DVT)  Result Date: 02/20/2020 CLINICAL DATA:  Bilateral lower extremity pain and edema. Hypoxia. Recent falls. Evaluate for DVT. EXAM: BILATERAL LOWER EXTREMITY VENOUS DOPPLER ULTRASOUND TECHNIQUE: Gray-scale sonography with graded compression, as well as color Doppler and duplex ultrasound were performed to evaluate the lower extremity deep venous systems from the level of the common femoral vein and including the common femoral, femoral, profunda femoral, popliteal and calf veins including the posterior tibial, peroneal and gastrocnemius veins when visible. The superficial great saphenous vein was also interrogated. Spectral Doppler was utilized to evaluate flow at rest and with distal augmentation maneuvers in the common femoral, femoral and popliteal veins. COMPARISON:  None. FINDINGS: RIGHT LOWER EXTREMITY Common Femoral Vein: No evidence of thrombus. Normal compressibility, respiratory phasicity and response to augmentation. Saphenofemoral Junction: No evidence of thrombus. Normal compressibility and flow on color Doppler imaging. Profunda Femoral Vein: No evidence of thrombus. Normal compressibility and flow on color Doppler imaging. Femoral Vein: No evidence of thrombus. Normal compressibility, respiratory phasicity and response to augmentation. Popliteal Vein: No evidence of thrombus. Normal compressibility, respiratory phasicity and response to augmentation. Calf Veins: No evidence of thrombus. Normal compressibility and flow on color Doppler imaging. Superficial Great Saphenous Vein: No evidence of thrombus. Normal compressibility. Venous Reflux:  None. Other Findings:  None. LEFT LOWER EXTREMITY Common Femoral Vein: No evidence of thrombus. Normal compressibility, respiratory phasicity and response to augmentation.  Saphenofemoral Junction: No evidence of thrombus. Normal compressibility and flow on color Doppler imaging.  Profunda Femoral Vein: No evidence of thrombus. Normal compressibility and flow on color Doppler imaging. Femoral Vein: No evidence of thrombus. Normal compressibility, respiratory phasicity and response to augmentation. Popliteal Vein: No evidence of thrombus. Normal compressibility, respiratory phasicity and response to augmentation. Calf Veins: No evidence of thrombus. Normal compressibility and flow on color Doppler imaging. Superficial Great Saphenous Vein: No evidence of thrombus. Normal compressibility. Venous Reflux:  None. Other Findings:  None. IMPRESSION: No evidence of DVT within either lower extremity. Electronically Signed   By: Sandi Mariscal M.D.   On: 02/20/2020 16:26      Assessment & Plan: Jill Hudson is a 85 y.o. white female with hypertension, peripheral vascular disease, CVA/TIA, coronary artery disease, diabetes mellitus type II , who was admitted to [facility] on 02/18/2020 for Hyperglycemia [R73.9] Acute CHF (congestive heart failure) ([place]) [I50.9] Acute respiratory failure with hypoxia ([place]) [J96.01] Community acquired pneumonia, unspecified laterality [J18.9] Acute on chronic congestive heart failure, unspecified heart failure type ([place]) [I50.9]  1. Acute renal failure with hyperkalemia and metabolic acidosis: on chronic kidney disease stage IIIA with proteinuria.  Baseline creatinine of 1.1, GFR of 47 on 12/27/2019.  Chronic kidney disease secondary to hypertension and diabetes.  Acute renal failure most likely secondary to acute cardio-renal syndrome.  No IV contrast exposure. No obstruction on ultrasound.  No acute indication for dialysis - hold losartan  2. Hypertension: with acute exacerbation of diastolic congestive heart failure.   3. Hyponatremia: with volume overload and chlorthalidone.  - hold chlorthalidone.      LOS: 3 Joyel Chenette 4/28/20211:18  PM

## 2020-02-21 NOTE — Progress Notes (Signed)
Patient ID: Jill Hudson, female   DOB: 23-Sep-1935, 85 y.o.   MRN: [id number] [facility] PROGRESS NOTE  Jill Hudson JOA:[id number] DOB: Feb 20, 1935 DOA: 02/18/2020 PCP: [name], MD  HPI/Subjective: Patient still feeling short of breath and some cough and some wheeze.  States she is urinating okay.  Not feeling well.  Feeling very weak.  Objective: Vitals:   02/21/20 1150 02/21/20 1539  BP: (!) 132/53   Pulse: 85   Resp: (!) 22   Temp: 98.2 F (36.8 C) 97.6 F (36.4 C)  SpO2: 94%     Intake/Output Summary (Last 24 hours) at 02/21/2020 1627 Last data filed at 02/21/2020 1540 Gross per 24 hour  Intake 991.9 ml  Output 300 ml  Net 691.9 ml   Filed Weights   02/19/20 0141 02/20/20 0413 02/21/20 0401  Weight: 55 kg 56.7 kg 57.8 kg    ROS: Review of Systems  Constitutional: Negative for fever.  Eyes: Negative for blurred vision.  Respiratory: Positive for cough, shortness of breath and wheezing.   Cardiovascular: Negative for chest pain.  Gastrointestinal: Negative for abdominal pain, constipation, diarrhea, nausea and vomiting.  Genitourinary: Negative for dysuria.  Musculoskeletal: Negative for joint pain.  Neurological: Negative for dizziness.   Exam: Physical Exam  Constitutional: She is oriented to person, place, and time.  HENT:  Nose: No mucosal edema.  Mouth/Throat: No oropharyngeal exudate or posterior oropharyngeal edema.  Eyes: Conjunctivae and lids are normal.  Neck: Carotid bruit is not present.  Cardiovascular: S1 normal and S2 normal. Exam reveals no gallop.  No murmur heard. Respiratory: No respiratory distress. She has decreased breath sounds in the right lower field and the left lower field. She has wheezes in the right middle field and the left middle field. She has rhonchi in the right lower field and the left lower field. She has no rales.  GI: Soft. Bowel sounds are normal. There is no abdominal tenderness.  Musculoskeletal:     Right  ankle: No swelling.     Left ankle: No swelling.  Lymphadenopathy:    She has no cervical adenopathy.  Neurological: She is alert and oriented to person, place, and time. No cranial nerve deficit.  Skin: Skin is warm. No rash noted. Nails show no clubbing.  Psychiatric: She has a normal mood and affect.      Data Reviewed: Basic Metabolic Panel: Recent Labs  Lab 02/18/20 1431 02/18/20 2302 02/19/20 0506 02/20/20 0411 02/21/20 0545  NA 131* 129* 129* 127* 127*  K 4.6 4.7 5.1 4.8 5.5*  CL 99 97* 98 96* 96*  CO2 19* 18* 20* 17* 18*  GLUCOSE 321* 286* 206* 232* 237*  BUN 35* 49* 57* 77* 93*  CREATININE 1.49* 1.68* 1.85* 2.33* 2.72*  CALCIUM 8.9 9.1 9.0 9.2 9.0  MG  --   --  2.1  --   --    Liver Function Tests: Recent Labs  Lab 02/21/20 1417  AST 33  ALT 50*  ALKPHOS 78  BILITOT 0.7  PROT 6.1*  ALBUMIN 3.5   CBC: Recent Labs  Lab 02/18/20 0517 02/19/20 0506 02/20/20 0917 02/21/20 0545  WBC 17.2* 15.3* 25.2* 19.9*  NEUTROABS 13.3*  --  22.5* 16.9*  HGB 10.2* 9.3* 9.3* 9.4*  HCT 31.7* 28.0* 29.1* 29.7*  MCV 91.4 90.0 92.4 91.7  PLT 299 268 339 302   BNP (last 3 results) Recent Labs    02/18/20 0517  BNP 600.0*    CBG: Recent Labs  Lab 02/20/20 1603 02/20/20 2008 02/20/20 2332 02/21/20 0751 02/21/20 1205  GLUCAP 234* 218* 181* 222* 274*    Recent Results (from the past 240 hour(s))  Respiratory Panel by RT PCR (Flu A&B, Covid) - Nasopharyngeal Swab     Status: None   Collection Time: 02/18/20  5:17 AM   Specimen: Nasopharyngeal Swab  Result Value Ref Range Status   SARS Coronavirus 2 by RT PCR NEGATIVE NEGATIVE Final    Comment: (NOTE) SARS-CoV-2 target nucleic acids are NOT DETECTED. The SARS-CoV-2 RNA is generally detectable in upper respiratoy specimens during the acute phase of infection. The lowest concentration of SARS-CoV-2 viral copies this assay can detect is 131 copies/mL. A negative result does not preclude SARS-Cov-2 infection  and should not be used as the sole basis for treatment or other patient management decisions. A negative result may occur with  improper specimen collection/handling, submission of specimen other than nasopharyngeal swab, presence of viral mutation(s) within the areas targeted by this assay, and inadequate number of viral copies (<131 copies/mL). A negative result must be combined with clinical observations, patient history, and epidemiological information. The expected result is Negative. Fact Sheet for Patients:  PinkCheek.be Fact Sheet for Healthcare Providers:  GravelBags.it This test is not yet ap proved or cleared by the [place] FDA and  has been authorized for detection and/or diagnosis of SARS-CoV-2 by FDA under an Emergency Use Authorization (EUA). This EUA will remain  in effect (meaning this test can be used) for the duration of the COVID-19 declaration under Section 564(b)(1) of the Act, 21 U.S.C. section 360bbb-3(b)(1), unless the authorization is terminated or revoked sooner.    Influenza A by PCR NEGATIVE NEGATIVE Final   Influenza B by PCR NEGATIVE NEGATIVE Final    Comment: (NOTE) The Xpert Xpress SARS-CoV-2/FLU/RSV assay is intended as an aid in  the diagnosis of influenza from Nasopharyngeal swab specimens and  should not be used as a sole basis for treatment. Nasal washings and  aspirates are unacceptable for Xpert Xpress SARS-CoV-2/FLU/RSV  testing. Fact Sheet for Patients: PinkCheek.be Fact Sheet for Healthcare Providers: GravelBags.it This test is not yet approved or cleared by the [place] FDA and  has been authorized for detection and/or diagnosis of SARS-CoV-2 by  FDA under an Emergency Use Authorization (EUA). This EUA will remain  in effect (meaning this test can be used) for the duration of the  Covid-19 declaration under Section  564(b)(1) of the Act, 21  U.S.C. section 360bbb-3(b)(1), unless the authorization is  terminated or revoked. Performed at [facility], [address] [place], [place] [address]   Blood culture (routine x 2)     Status: None (Preliminary result)   Collection Time: 02/18/20  6:29 AM   Specimen: BLOOD  Result Value Ref Range Status   Specimen Description BLOOD BLOOD LEFT HAND  Final   Special Requests   Final    BOTTLES DRAWN AEROBIC AND ANAEROBIC Blood Culture results may not be optimal due to an excessive volume of blood received in culture bottles   Culture   Final    NO GROWTH 3 DAYS Performed at [facility], [address] [place], [place] [address]    Report Status PENDING  Incomplete  Blood culture (routine x 2)     Status: None (Preliminary result)   Collection Time: 02/18/20  6:29 AM   Specimen: BLOOD  Result Value Ref Range Status   Specimen Description BLOOD LEFT ANTECUBITAL  Final  Special Requests   Final    BOTTLES DRAWN AEROBIC AND ANAEROBIC Blood Culture adequate volume   Culture   Final    NO GROWTH 3 DAYS Performed at [facility], [address] [place], [place] [address]    Report Status PENDING  Incomplete  Urine culture     Status: None   Collection Time: 02/18/20 11:23 AM   Specimen: Urine, Random  Result Value Ref Range Status   Specimen Description   Final    URINE, RANDOM Performed at [facility], [address] [place], [place] [address]    Special Requests   Final    NONE Performed at [facility], [address], [place], [place] [address]    Culture   Final    NO GROWTH Performed at [facility], [place] [address], [place], [place] [address]    Report Status 02/19/2020 FINAL  Final     Studies: DG Chest 1 View  Result Date: 02/20/2020 CLINICAL DATA:  Dyspnea. EXAM: CHEST  1 VIEW COMPARISON:  February 18, 2020. FINDINGS: Stable cardiomediastinal silhouette. Atherosclerosis of  thoracic aorta is noted. No pneumothorax is noted. Stable bibasilar opacities are noted concerning for edema with associated pleural effusions. Bony thorax is unremarkable. IMPRESSION: Aortic atherosclerosis. Stable bibasilar opacities are noted concerning for edema with associated pleural effusions. Electronically Signed   By: Marijo Conception M.D.   On: 02/20/2020 10:50   US RENAL  Result Date: 02/21/2020 CLINICAL DATA:  Acute kidney injury EXAM: RENAL / URINARY TRACT ULTRASOUND COMPLETE COMPARISON:  None. FINDINGS: Right Kidney: Renal measurements: 9.1 x 4.1 x 4.7 cm = volume: 92 mL . Echogenicity is slightly increased. Renal cortical thickness is within normal limits. No mass, perinephric fluid, or hydronephrosis visualized. No sonographically demonstrable calculus or ureterectasis. Left Kidney: Renal measurements: 10.4 x 4.7 x 4.5 cm = volume: 112 mL. Echogenicity is slightly increased. Renal cortical thickness normal. No perinephric fluid or hydronephrosis visualized. There is a cyst in the upper pole left kidney measuring 3.3 x 2.9 x 2.5 cm. A second cyst in this area measures 2.5 x 1.8 x 1.4 cm. A cyst in the lower pole region on the left measures 1.6 x 1.3 x 1.2 cm. Bladder: Appears normal for degree of bladder distention. Other: Bilateral pleural effusions noted. IMPRESSION: 1. Renal echogenicity mildly increased bilaterally, a finding that may be indicative of a degree of medical renal disease. Renal cortical thickness within normal limits bilaterally. No obstructing focus in either kidney. 2.  There are cysts in the left kidney. 3.  Bilateral pleural effusions. Electronically Signed   By: Lowella Grip III M.D.   On: 02/21/2020 11:02   US Venous Img Lower Bilateral (DVT)  Result Date: 02/20/2020 CLINICAL DATA:  Bilateral lower extremity pain and edema. Hypoxia. Recent falls. Evaluate for DVT. EXAM: BILATERAL LOWER EXTREMITY VENOUS DOPPLER ULTRASOUND TECHNIQUE: Gray-scale sonography with graded  compression, as well as color Doppler and duplex ultrasound were performed to evaluate the lower extremity deep venous systems from the level of the common femoral vein and including the common femoral, femoral, profunda femoral, popliteal and calf veins including the posterior tibial, peroneal and gastrocnemius veins when visible. The superficial great saphenous vein was also interrogated. Spectral Doppler was utilized to evaluate flow at rest and with distal augmentation maneuvers in the common femoral, femoral and popliteal veins. COMPARISON:  None. FINDINGS: RIGHT LOWER EXTREMITY Common Femoral Vein: No evidence of thrombus. Normal compressibility, respiratory phasicity and response  to augmentation. Saphenofemoral Junction: No evidence of thrombus. Normal compressibility and flow on color Doppler imaging. Profunda Femoral Vein: No evidence of thrombus. Normal compressibility and flow on color Doppler imaging. Femoral Vein: No evidence of thrombus. Normal compressibility, respiratory phasicity and response to augmentation. Popliteal Vein: No evidence of thrombus. Normal compressibility, respiratory phasicity and response to augmentation. Calf Veins: No evidence of thrombus. Normal compressibility and flow on color Doppler imaging. Superficial Great Saphenous Vein: No evidence of thrombus. Normal compressibility. Venous Reflux:  None. Other Findings:  None. LEFT LOWER EXTREMITY Common Femoral Vein: No evidence of thrombus. Normal compressibility, respiratory phasicity and response to augmentation. Saphenofemoral Junction: No evidence of thrombus. Normal compressibility and flow on color Doppler imaging. Profunda Femoral Vein: No evidence of thrombus. Normal compressibility and flow on color Doppler imaging. Femoral Vein: No evidence of thrombus. Normal compressibility, respiratory phasicity and response to augmentation. Popliteal Vein: No evidence of thrombus. Normal compressibility, respiratory phasicity and  response to augmentation. Calf Veins: No evidence of thrombus. Normal compressibility and flow on color Doppler imaging. Superficial Great Saphenous Vein: No evidence of thrombus. Normal compressibility. Venous Reflux:  None. Other Findings:  None. IMPRESSION: No evidence of DVT within either lower extremity. Electronically Signed   By: Sandi Mariscal M.D.   On: 02/20/2020 16:26    Scheduled Meds: . azithromycin  250 mg Oral Daily  . budesonide (PULMICORT) nebulizer solution  0.5 mg Nebulization BID  . clopidogrel  37.5 mg Oral Daily  . feeding supplement (GLUCERNA SHAKE)  237 mL Oral TID BM  . ferrous sulfate  325 mg Oral Q breakfast  . furosemide  20 mg Intravenous Q12H  . gabapentin  100 mg Oral QHS  . insulin aspart  0-5 Units Subcutaneous QHS  . insulin aspart  0-9 Units Subcutaneous TID WC  . insulin aspart  3 Units Subcutaneous TID WC  . insulin glargine  10 Units Subcutaneous BID  . ipratropium-albuterol  3 mL Nebulization Q4H  . melatonin  2.5 mg Oral QHS  . methylPREDNISolone (SOLU-MEDROL) injection  40 mg Intravenous Q12H  . metoprolol tartrate  25 mg Oral BID  . multivitamin-lutein  1 capsule Oral BID  . rosuvastatin  5 mg Oral Once per day on Mon Wed Fri Sat  . sodium chloride flush  3 mL Intravenous Q12H   Continuous Infusions: . heparin 550 Units/hr (02/21/20 0415)    Assessment/Plan:  1. Acute hypoxic respiratory failure.  Patient was on BiPAP overnight and currently on 4 L of oxygen.  Patient does not wear oxygen at home. 2. NSTEMI.  Patient on heparin drip for 48 hours.  Patient on Plavix.  Cardiology would like to do a left heart cath but patient has worsening kidney function. 3. Acute diastolic congestive heart failure with worsening creatinine.  Started on IV Lasix 4. COPD exacerbation on steroids and nebulizer treatments 5. Acute kidney injury on chronic kidney disease stage IIIa.  Creatinine worsening on a daily basis.  Need to watch creatinine closely with  diuresis.  Creatinine up at 2.72 today. 6. Hyponatremia likely secondary to congestive heart failure 7. Peripheral vascular disease on Plavix and Crestor 8. Chronic kidney disease stage IIIa with type 2 diabetes mellitus  Code Status:     Code Status Orders  (From admission, onward)         Start     Ordered   02/18/20 1100  Full code  Continuous     02/18/20 1059        Code  Status History    Date Active Date Inactive Code Status Order ID Comments User Context   12/25/2019 0555 12/28/2019 2204 Full Code [id number]  Sidney Ace Arvella Merles, MD ED   10/12/2018 1647 10/15/2018 1808 Full Code [id number]  Katha Cabal, MD Inpatient   09/16/2018 1254 09/16/2018 2048 Full Code [id number]  Katha Cabal, MD Inpatient   08/11/2018 1706 08/13/2018 2136 Full Code [id number]  Gorden Harms, MD Inpatient   07/26/2018 0921 07/26/2018 1652 Full Code [id number]  Katha Cabal, MD Inpatient   08/23/2017 0935 08/23/2017 1419 Full Code [id number]  Algernon Huxley, MD Inpatient   02/15/2017 1538 02/16/2017 1959 Full Code [id number]  Dustin Flock, MD Inpatient   Advance Care Planning Activity     Family Communication: Spoke with the patient's daughter on the phone Disposition Plan: The patient has multiple complex issues that will require at least a few more days in the hospital.  Since she came in not wearing oxygen I would like to send her home not wearing oxygen.  Her kidney function is worsening every day.  Cardiology would like to do a cardiac cath at some point for her NSTEMI.  Also need diuresis with CHF.  Goal will be going home with home health  Consultants:  Cardiology  Critical care specialist  Nephrology  Antibiotics:  Zithromax  Time spent: 28 minutes  [place]

## 2020-02-21 NOTE — Progress Notes (Signed)
Inpatient Diabetes Program Recommendations  AACE/ADA: New Consensus Statement on Inpatient Glycemic Control   Target Ranges:  Prepandial:   less than 140 mg/dL      Peak postprandial:   less than 180 mg/dL (1-2 hours)      Critically ill patients:  140 - 180 mg/dL   Results for RYKA, BEIGHLEY (MRN [id number]) as of 02/21/2020 10:44  Ref. Range 02/20/2020 07:59 02/20/2020 11:56 02/20/2020 16:03 02/20/2020 20:08 02/20/2020 23:32 02/21/2020 07:51  Glucose-Capillary Latest Ref Range: 70 - 99 mg/dL 270 (H)  Novolog 5 units  Lantus 8 units 342 (H)  Novolog 10 units  Lantus 2 units 234 (H)  Novolog 6 units 218 (H)  Novolog 2 units  Lantus 10 units 181 (H) 222 (H)  Novolog 6 units  Lantus 10 units   Review of Glycemic Control  Diabetes history: DM2 Outpatient Diabetes medications: Basaglar 12 units daily, Novolog 3 units TID with meals, Metformin XR 500 mg BID Current orders for Inpatient glycemic control: Lantus 10 units BID, Novolog 3 units TID with meals, Novolog 0-9 units TID with meals, Novolog 0-5 units QHS; Solumedrol 40 mg Q12H  Inpatient Diabetes Program Recommendations:   Insulin: If steroids are continued as ordered, please consider increasing Lantus to 13 units BID and meal coverage to Novolog 6 units TID with meals if patient eats at least 50% of meals.  Thanks, Barnie Alderman, RN, MSN, CDE Diabetes Coordinator Inpatient Diabetes Program [phone] (Team Pager from 8am to 5pm)

## 2020-02-21 NOTE — Care Management Important Message (Signed)
Important Message  Patient Details  Name: PRISMA DECARLO MRN: [id number] Date of Birth: November 12, 1934   Medicare Important Message Given:  Yes     Dannette Barbara 02/21/2020, 11:11 AM

## 2020-02-21 NOTE — Consult Note (Signed)
[facility] for Heparin Indication: chest pain/ACS  Allergies  Allergen Reactions  . Saxagliptin Diarrhea  . Epinephrine Other (See Comments)    Patient does not remember what happens when she uses this  . Atorvastatin Other (See Comments)    Muscle aches  . Codeine Other (See Comments)    Upset stomach  . Ezetimibe Other (See Comments)    Myalgias(ZETIA)  . Limonene Rash    Patient does not recall this reaction  . Nitrofurantoin Rash and Other (See Comments)    Pruitus  . Sulfa Antibiotics Rash and Other (See Comments)    Sore mouth     Patient Measurements: Height: 5' (152.4 cm) Weight: 57.8 kg (127 lb 8 oz) IBW/kg (Calculated) : 45.5 Heparin Dosing Weight: 56.7 kg  Vital Signs: Temp: 97.6 F (36.4 C) (04/28 0401) Temp Source: Oral (04/28 0401) BP: 110/51 (04/28 0401) Pulse Rate: 69 (04/28 0401)  Labs: Recent Labs    02/18/20 1431 02/19/20 0506 02/19/20 0506 02/19/20 1525 02/20/20 0411 02/20/20 0917 02/20/20 0932 02/20/20 1121 02/20/20 2001 02/21/20 0545  HGB  --  9.3*   < >  --   --  9.3*  --   --   --  9.4*  HCT  --  28.0*  --   --   --  29.1*  --   --   --  29.7*  PLT  --  268  --   --   --  339  --   --   --  302  APTT  --   --   --   --   --   --  31  --   --   --   LABPROT  --   --   --   --   --   --  15.4*  --   --   --   INR  --   --   --   --   --   --  1.2  --   --   --   HEPARINUNFRC  --   --   --   --   --   --   --   --  1.20* 0.55  CREATININE  --  1.85*  --   --  2.33*  --   --   --   --  2.72*  TROPONINIHS   < >  --   --  3,082*  --  1,409*  --  1,327*  --   --    < > = values in this interval not displayed.    Estimated Creatinine Clearance: 12.5 mL/min (A) (by C-G formula based on SCr of 2.72 mg/dL (H)).   Medical History: Past Medical History:  Diagnosis Date  . Allergies   . Anxiety   . Arthritis    spine and shoulder  . Atherosclerosis of artery of extremity with rest pain ([place])  10/12/2018  . Cancer ([place])    skin  . Diabetes mellitus without complication ([place])   . Heart murmur   . Hyperlipidemia   . Hypertension   . Macula lutea degeneration   . Mitral and aortic valve disease   . Myocardial infarction [facility])    may have had a "light" heart attack  . Occasional tremors   . PAD (peripheral artery disease) ([place])   . Shingles    patient unaware but daughter confirms. it was a long time ago  . Stroke [facility])  01/2017   may have had a slight stroke  . TIA (transient ischemic attack) 01/2017  . UTI (urinary tract infection)   . Vascular disease, peripheral ([place])     Medications:  Medications Prior to Admission  Medication Sig Dispense Refill Last Dose  . acetaminophen (TYLENOL) 650 MG CR tablet Take 1,300 mg by mouth every 8 (eight) hours as needed for pain.   prn at prn  . baclofen (LIORESAL) 10 MG tablet TAKE 1 TABLET(10 MG) BY MOUTH THREE TIMES DAILY (Patient taking differently: Take 10 mg by mouth daily as needed. ) 270 tablet 0 prn at prn  . chlorthalidone (HYGROTON) 25 MG tablet Take 25 mg by mouth daily.    Unknown at Unknown  . clopidogrel (PLAVIX) 75 MG tablet TAKE 1 TABLET BY MOUTH EVERY DAY (Patient taking differently: Take 37.5 mg by mouth daily. ) 30 tablet 1 Unknown at Unknown  . Cyanocobalamin (RA VITAMIN B-12 TR) 1000 MCG TBCR Take 1,000 mcg by mouth daily.    Unknown at Unknown  . diltiazem (CARTIA XT) 180 MG 24 hr capsule Take 180 mg by mouth 2 (two) times daily.    Unknown at Unknown  . ferrous sulfate 325 (65 FE) MG tablet Take 325 mg by mouth daily with breakfast.   Unknown at Unknown  . gabapentin (NEURONTIN) 100 MG capsule TAKE 1 TO 2 CAPSULES(100 TO 200 MG) BY MOUTH AT BEDTIME (Patient taking differently: Take 100-200 mg by mouth at bedtime. ) 180 capsule 6 Unknown at Unknown  . insulin aspart (NOVOLOG FLEXPEN) 100 UNIT/ML FlexPen Inject 3 Units into the skin 3 (three) times daily with meals. At lunch (Patient taking differently: Inject 2 Units  into the skin daily. At breakfast) 15 mL 3 Unknown at Unknown  . Insulin Glargine (BASAGLAR KWIKPEN) 100 UNIT/ML SOPN Inject 0.12 mLs (12 Units total) into the skin daily. 15 mL 3 Unknown at Unknown  . losartan (COZAAR) 50 MG tablet Take 50 mg by mouth daily.    Unknown at Unknown  . metFORMIN (GLUCOPHAGE-XR) 500 MG 24 hr tablet TAKE 1 TABLET BY MOUTH TWICE DAILY 60 tablet 11 Unknown at Unknown  . Multiple Vitamins-Minerals (PRESERVISION AREDS PO) Take 1 capsule by mouth 2 (two) times daily.    Unknown at Unknown  . nitroGLYCERIN (NITROSTAT) 0.4 MG SL tablet 1 tab under the tongue for CP, may repeat in 5 minutes up to 3 doses 25 tablet 5 prn at prn  . rosuvastatin (CRESTOR) 5 MG tablet Take 5 mg by mouth every other day. Mon, Wed, Fri and Sat.   Unknown at Unknown  . B-D ULTRAFINE III SHORT PEN 31G X 8 MM MISC USE AS DIRECTED 100 each 3   . feeding supplement, GLUCERNA SHAKE, (GLUCERNA SHAKE) LIQD Take 237 mLs by mouth 3 (three) times daily between meals. 21330 mL 0   . pantoprazole (PROTONIX) 40 MG tablet Take 1 tablet (40 mg total) by mouth daily. (Patient not taking: Reported on 02/18/2020) 30 tablet 0 Not Taking at Unknown   Scheduled:  . azithromycin  250 mg Oral Daily  . budesonide (PULMICORT) nebulizer solution  0.5 mg Nebulization BID  . clopidogrel  37.5 mg Oral Daily  . feeding supplement (GLUCERNA SHAKE)  237 mL Oral TID BM  . ferrous sulfate  325 mg Oral Q breakfast  . gabapentin  100 mg Oral QHS  . insulin aspart  0-5 Units Subcutaneous QHS  . insulin aspart  0-9 Units Subcutaneous TID WC  . insulin aspart  3 Units Subcutaneous TID WC  . insulin glargine  10 Units Subcutaneous BID  . ipratropium-albuterol  3 mL Nebulization Q4H  . melatonin  2.5 mg Oral QHS  . methylPREDNISolone (SOLU-MEDROL) injection  40 mg Intravenous Q12H  . metoprolol tartrate  25 mg Oral BID  . multivitamin-lutein  1 capsule Oral BID  . rosuvastatin  5 mg Oral Once per day on Mon Wed Fri Sat  . sodium  chloride flush  3 mL Intravenous Q12H   Infusions:  . heparin 550 Units/hr (02/21/20 0415)   PRN: acetaminophen, albuterol, baclofen, clonazepam, dextromethorphan-guaiFENesin, hydrALAZINE, morphine injection, nitroGLYCERIN, ondansetron (ZOFRAN) IV Anti-infectives (From admission, onward)   Start     Dose/Rate Route Frequency Ordered Stop   02/21/20 1800  azithromycin (ZITHROMAX) tablet 250 mg     250 mg Oral Daily 02/20/20 1056 02/25/20 0959   02/20/20 1800  azithromycin (ZITHROMAX) tablet 500 mg     500 mg Oral Daily-1800 02/20/20 1056 02/20/20 1851   02/19/20 1000  doxycycline (VIBRAMYCIN) 100 mg in sodium chloride 0.9 % 250 mL IVPB  Status:  Discontinued     100 mg 125 mL/hr over 120 Minutes Intravenous Every 12 hours 02/19/20 0818 02/20/20 1032   02/18/20 0630  cefTRIAXone (ROCEPHIN) 1 g in sodium chloride 0.9 % 100 mL IVPB     1 g 200 mL/hr over 30 Minutes Intravenous  Once 02/18/20 0616 02/18/20 0809   02/18/20 0630  azithromycin (ZITHROMAX) 500 mg in sodium chloride 0.9 % 250 mL IVPB     500 mg 250 mL/hr over 60 Minutes Intravenous  Once 02/18/20 0616 02/18/20 0810      Assessment: Pharmacy consulted to start heparin for ACS. NO DOAC PTA noted. Baseline labs ordered.   Goal of Therapy:  Heparin level 0.3-0.7 units/ml Monitor platelets by anticoagulation protocol: Yes   Plan:  Give 3000 units bolus x 1, patient received enoxaparin 30 mg daily x 1 last night.  Start heparin infusion at 650 units/hr Check anti-Xa level in 8 hours and daily while on heparin Continue to monitor H&H and platelets  4/27:  HL @ 2100 = 1.2 Called lab , phlebotomist confirmed level was drawn from arm opposite from IV infusion site. Will hold Heparin gtt for 1 hr and restart drip @ 550 units/hr. Will recheck HL 8 hrs after rate change on 4/28 @ 0600.   4/28 0545 HL 0.55, therapeutic x 1.  CBC stable.  Will continue heparin at current rate and recheck HL in 8 hours to confirm.  Ena Dawley,  PharmD 02/21/2020,6:40 AM

## 2020-02-21 NOTE — Progress Notes (Addendum)
Progress Note  Patient Name: Jill Hudson Date of Encounter: 02/21/2020  Primary Cardiologist: New, Dr. Fletcher Anon rounding  Subjective   Still with shortness of breath on exertion.  Just came back from having a renal ultrasound.  She feels like she needs to go on BiPAP soon.  Currently eating lunch.  Daughter at bedside.  Inpatient Medications    Scheduled Meds: . azithromycin  250 mg Oral Daily  . budesonide (PULMICORT) nebulizer solution  0.5 mg Nebulization BID  . clopidogrel  37.5 mg Oral Daily  . feeding supplement (GLUCERNA SHAKE)  237 mL Oral TID BM  . ferrous sulfate  325 mg Oral Q breakfast  . furosemide  20 mg Intravenous Q12H  . gabapentin  100 mg Oral QHS  . insulin aspart  0-5 Units Subcutaneous QHS  . insulin aspart  0-9 Units Subcutaneous TID WC  . insulin aspart  3 Units Subcutaneous TID WC  . insulin glargine  10 Units Subcutaneous BID  . ipratropium-albuterol  3 mL Nebulization Q4H  . melatonin  2.5 mg Oral QHS  . methylPREDNISolone (SOLU-MEDROL) injection  40 mg Intravenous Q12H  . metoprolol tartrate  25 mg Oral BID  . multivitamin-lutein  1 capsule Oral BID  . rosuvastatin  5 mg Oral Once per day on Mon Wed Fri Sat  . sodium chloride flush  3 mL Intravenous Q12H   Continuous Infusions: . heparin 550 Units/hr (02/21/20 0415)   PRN Meds: acetaminophen, albuterol, baclofen, clonazepam, dextromethorphan-guaiFENesin, hydrALAZINE, morphine injection, nitroGLYCERIN, ondansetron (ZOFRAN) IV   Vital Signs    Vitals:   02/20/20 2000 02/21/20 0401 02/21/20 0700 02/21/20 1150  BP: 109/66 (!) 110/51 (!) 116/53 (!) 132/53  Pulse: 78 69 71 85  Resp: (!) 24 16 18  (!) 22  Temp:  97.6 F (36.4 C) 97.7 F (36.5 C) 98.2 F (36.8 C)  TempSrc:  Oral Oral Oral  SpO2: 93% 94% (!) 84% 94%  Weight:  57.8 kg    Height:        Intake/Output Summary (Last 24 hours) at 02/21/2020 1258 Last data filed at 02/21/2020 1021 Gross per 24 hour  Intake 783 ml  Output --  Net  783 ml   Last 3 Weights 02/21/2020 02/20/2020 02/19/2020  Weight (lbs) 127 lb 8 oz 125 lb 1.6 oz 121 lb 4.8 oz  Weight (kg) 57.834 kg 56.745 kg 55.021 kg      Telemetry    Sinus rhythm, 88- Personally Reviewed  ECG    New tracing obtained- Personally Reviewed  Physical Exam   GEN:  Mild respiratory distress.   Neck: No JVD Cardiac: RRR, distant heart sounds.  Respiratory:  Bilateral wheezing. GI: Soft, nontender, non-distended  MS: No edema; No deformity. Neuro:  Nonfocal  Psych: Normal affect   Labs    High Sensitivity Troponin:   Recent Labs  Lab 02/18/20 1051 02/18/20 1431 02/19/20 1525 02/20/20 0917 02/20/20 1121  TROPONINIHS 193* 202* 3,082* 1,409* 1,327*      Chemistry Recent Labs  Lab 02/19/20 0506 02/20/20 0411 02/21/20 0545  NA 129* 127* 127*  K 5.1 4.8 5.5*  CL 98 96* 96*  CO2 20* 17* 18*  GLUCOSE 206* 232* 237*  BUN 57* 77* 93*  CREATININE 1.85* 2.33* 2.72*  CALCIUM 9.0 9.2 9.0  GFRNONAA 25* 19* 16*  GFRAA 29* 22* 18*  ANIONGAP 11 14 13      Hematology Recent Labs  Lab 02/19/20 0506 02/20/20 0917 02/21/20 0545  WBC 15.3* 25.2* 19.9*  RBC 3.11* 3.15* 3.24*  HGB 9.3* 9.3* 9.4*  HCT 28.0* 29.1* 29.7*  MCV 90.0 92.4 91.7  MCH 29.9 29.5 29.0  MCHC 33.2 32.0 31.6  RDW 13.4 13.9 14.0  PLT 268 339 302    BNP Recent Labs  Lab 02/18/20 0517  BNP 600.0*     DDimer No results for input(s): DDIMER in the last 168 hours.   Radiology    DG Chest 1 View  Result Date: 02/20/2020 CLINICAL DATA:  Dyspnea. EXAM: CHEST  1 VIEW COMPARISON:  February 18, 2020. FINDINGS: Stable cardiomediastinal silhouette. Atherosclerosis of thoracic aorta is noted. No pneumothorax is noted. Stable bibasilar opacities are noted concerning for edema with associated pleural effusions. Bony thorax is unremarkable. IMPRESSION: Aortic atherosclerosis. Stable bibasilar opacities are noted concerning for edema with associated pleural effusions. Electronically Signed   By:  Marijo Conception M.D.   On: 02/20/2020 10:50   US RENAL  Result Date: 02/21/2020 CLINICAL DATA:  Acute kidney injury EXAM: RENAL / URINARY TRACT ULTRASOUND COMPLETE COMPARISON:  None. FINDINGS: Right Kidney: Renal measurements: 9.1 x 4.1 x 4.7 cm = volume: 92 mL . Echogenicity is slightly increased. Renal cortical thickness is within normal limits. No mass, perinephric fluid, or hydronephrosis visualized. No sonographically demonstrable calculus or ureterectasis. Left Kidney: Renal measurements: 10.4 x 4.7 x 4.5 cm = volume: 112 mL. Echogenicity is slightly increased. Renal cortical thickness normal. No perinephric fluid or hydronephrosis visualized. There is a cyst in the upper pole left kidney measuring 3.3 x 2.9 x 2.5 cm. A second cyst in this area measures 2.5 x 1.8 x 1.4 cm. A cyst in the lower pole region on the left measures 1.6 x 1.3 x 1.2 cm. Bladder: Appears normal for degree of bladder distention. Other: Bilateral pleural effusions noted. IMPRESSION: 1. Renal echogenicity mildly increased bilaterally, a finding that may be indicative of a degree of medical renal disease. Renal cortical thickness within normal limits bilaterally. No obstructing focus in either kidney. 2.  There are cysts in the left kidney. 3.  Bilateral pleural effusions. Electronically Signed   By: Lowella Grip III M.D.   On: 02/21/2020 11:02   US Venous Img Lower Bilateral (DVT)  Result Date: 02/20/2020 CLINICAL DATA:  Bilateral lower extremity pain and edema. Hypoxia. Recent falls. Evaluate for DVT. EXAM: BILATERAL LOWER EXTREMITY VENOUS DOPPLER ULTRASOUND TECHNIQUE: Gray-scale sonography with graded compression, as well as color Doppler and duplex ultrasound were performed to evaluate the lower extremity deep venous systems from the level of the common femoral vein and including the common femoral, femoral, profunda femoral, popliteal and calf veins including the posterior tibial, peroneal and gastrocnemius veins when  visible. The superficial great saphenous vein was also interrogated. Spectral Doppler was utilized to evaluate flow at rest and with distal augmentation maneuvers in the common femoral, femoral and popliteal veins. COMPARISON:  None. FINDINGS: RIGHT LOWER EXTREMITY Common Femoral Vein: No evidence of thrombus. Normal compressibility, respiratory phasicity and response to augmentation. Saphenofemoral Junction: No evidence of thrombus. Normal compressibility and flow on color Doppler imaging. Profunda Femoral Vein: No evidence of thrombus. Normal compressibility and flow on color Doppler imaging. Femoral Vein: No evidence of thrombus. Normal compressibility, respiratory phasicity and response to augmentation. Popliteal Vein: No evidence of thrombus. Normal compressibility, respiratory phasicity and response to augmentation. Calf Veins: No evidence of thrombus. Normal compressibility and flow on color Doppler imaging. Superficial Great Saphenous Vein: No evidence of thrombus. Normal compressibility. Venous Reflux:  None. Other Findings:  None.  LEFT LOWER EXTREMITY Common Femoral Vein: No evidence of thrombus. Normal compressibility, respiratory phasicity and response to augmentation. Saphenofemoral Junction: No evidence of thrombus. Normal compressibility and flow on color Doppler imaging. Profunda Femoral Vein: No evidence of thrombus. Normal compressibility and flow on color Doppler imaging. Femoral Vein: No evidence of thrombus. Normal compressibility, respiratory phasicity and response to augmentation. Popliteal Vein: No evidence of thrombus. Normal compressibility, respiratory phasicity and response to augmentation. Calf Veins: No evidence of thrombus. Normal compressibility and flow on color Doppler imaging. Superficial Great Saphenous Vein: No evidence of thrombus. Normal compressibility. Venous Reflux:  None. Other Findings:  None. IMPRESSION: No evidence of DVT within either lower extremity. Electronically  Signed   By: Sandi Mariscal M.D.   On: 02/20/2020 16:26    Cardiac Studies   Echo 02/19/20 1. Left ventricular ejection fraction, by estimation, is 55 to 60%. The  left ventricle has normal function. The left ventricle has no regional  wall motion abnormalities. There is mild left ventricular hypertrophy.  Left ventricular diastolic parameters  are consistent with Grade II diastolic dysfunction (pseudonormalization).  2. Right ventricular systolic function is normal. The right ventricular  size is normal. There is moderately elevated pulmonary artery systolic  pressure.  3. Left atrial size was mildly dilated.  4. The mitral valve is myxomatous. Moderate mitral valve regurgitation.  No evidence of mitral stenosis.  5. The aortic valve is normal in structure. Aortic valve regurgitation is  not visualized. Mild aortic valve sclerosis is present, with no evidence  of aortic valve stenosis.  6. The inferior vena cava is dilated in size with >50% respiratory  variability, suggesting right atrial pressure of 8 mmHg.   Patient Profile     85 y.o. female history of heart failure preserved ejection fraction, moderate MR, diabetes, CKD, prior smoker being seen for shortness of breath and elevated troponins.   Assessment & Plan    1.  NSTEMI -Denies chest pain -Troponins peaked at 3082 -Continue heparin for total of 48 hours -Plavix renally dosed. -Creatinine worse today -Plan for left heart cath when creatinine is better and patient able to lay flat.  2.  Respiratory failure -Severe dyspnea on exertion, wheezing on lung exam -BiPAP as per pulmonary team -Bilateral pleural effusions noted on renal ultrasound -On Lasix IV 20 mg twice daily  3. HFpEF -Pleural effusions noted on renal ultrasound -Started on IV Lasix as per renal -Monitor ins and outs closely, monitor creatinine  4.  Acute on chronic CKD -Nephrology following -Avoid nephrotoxic agents -IV Lasix as per renal     Total encounter time 35 minutes  Greater than 50% was spent in counseling and coordination of care with the patient, staff and daughter     Signed, Kate Sable, MD  02/21/2020, 12:58 PM

## 2020-02-21 NOTE — Progress Notes (Signed)
Physical Therapy Treatment Patient Details Name: Jill Hudson MRN: [id number] DOB: 26-Dec-1934 Today's Date: 02/21/2020    History of Present Illness Jill Hudson is a 85 y.o. female with medical history significant of Hypertension, hyperlipidemia, diabetes mellitus, stroke, TIA, GERD, anxiety, PVD, CAD, myocardial infarction, former smoker, CKD stage III, GI bleeding, iron deficiency anemia, who presented to the ED with shortness of breath. Breathing ad been progressively worsening.  Per EMS, patient pt woke up with gasping for air, and rescue found pt's oxygen saturation at 79% on RA. Pt was given 125 mg of solumedrol and 1.5 duonebs, pt's oxygen saturation improved to 98% on NSR. At arrival to ED, pt still has acute respiratory distress.  BiPAP was started.  Patient states that she has dry cough, no fever or chills.  She had some chest discomfort earlier, which has resolved. Patient states that she has mild intermittent loose stool bowel movement recently, but currently no nausea, vomiting, diarrhea or abdominal pain.  No symptoms of UTI or unilateral weakness. She was found to have BNP 600, troponin 67 -->143, WBC 17.2, negative Covid PCR, lactic acid 2.5, sodium 128, renal function close to baseline, temperature 97.7, blood pressure 165/56, heart rate 76, RR 27, oxygen saturation 99% on BiPAP.  Chest x-ray showed interstitial pulmonary edema and obesity bilateral basilar opacity.  Patient is now admitted to stepdown as inpatient for acute respiratory failure secondary to COPD exacerbation, CHF with elevated troponin likely due to demand ischemia, and hypotension. Per cardiology note pt also with recent fall and scalp contusion.     PT Comments    Patient received in bed, she is pleasant and agrees to PT session. Daughter present in room for session. She continues to reports significant SOB during activity which is limiting her mobility. She is on 4 lpm via [place] and sat remained greater than 90%  throughout session, however patient reports 8/10 RPE. She has audible wheezing. She is educated in pursed lip breathing to try to bring respiratory rate down and for relaxation as she is very gittery and anxious. She is able to demonstrate back pursed lip breathing. She will continue to benefit from skilled PT while here to improve activity tolerance, strength and functional independence for return home at discharge.       Follow Up Recommendations  Home health PT     Equipment Recommendations  None recommended by PT;Other (comment)    Recommendations for Other Services       Precautions / Restrictions Precautions Precautions: Fall Restrictions Weight Bearing Restrictions: No    Mobility  Bed Mobility Overal bed mobility: Modified Independent                Transfers Overall transfer level: Modified independent Equipment used: Rolling walker (2 wheeled) Transfers: Sit to/from Stand Sit to Stand: Modified independent (Device/Increase time)            Ambulation/Gait Ambulation/Gait assistance: Min guard Gait Distance (Feet): 50 Feet Assistive device: Rolling walker (2 wheeled) Gait Pattern/deviations: Step-through pattern Gait velocity: decr   General Gait Details: Patient does well with general mobility, does not have balance issues, but fatigues easily and feels very SOB with mobility. O2 saturations remained > 90% throughout session on 4 lpm O2. She did not want to go any further this visit.   Stairs             Wheelchair Mobility    Modified Rankin (Stroke Patients Only)       Balance Overall  balance assessment: Needs assistance Sitting-balance support: Feet supported Sitting balance-Leahy Scale: Good     Standing balance support: Bilateral upper extremity supported;During functional activity Standing balance-Leahy Scale: Fair Standing balance comment: reliant on RW at this time                            Cognition  Arousal/Alertness: Awake/alert Behavior During Therapy: [facility] for tasks assessed/performed;Anxious Overall Cognitive Status: Within Functional Limits for tasks assessed                                        Exercises      General Comments        Pertinent Vitals/Pain      Home Living                      Prior Function            PT Goals (current goals can now be found in the care plan section) Acute Rehab PT Goals Patient Stated Goal: Return to her prior level of function at home. Pt hopes to stay off supplemental O2 PT Goal Formulation: With patient/family Time For Goal Achievement: 03/04/20 Potential to Achieve Goals: Fair Progress towards PT goals: Progressing toward goals    Frequency    Min 2X/week      PT Plan Current plan remains appropriate    Co-evaluation              AM-PAC PT "6 Clicks" Mobility   Outcome Measure  Help needed turning from your back to your side while in a flat bed without using bedrails?: None Help needed moving from lying on your back to sitting on the side of a flat bed without using bedrails?: None Help needed moving to and from a bed to a chair (including a wheelchair)?: A Little Help needed standing up from a chair using your arms (e.g., wheelchair or bedside chair)?: None Help needed to walk in hospital room?: A Little Help needed climbing 3-5 steps with a railing? : A Little 6 Click Score: 21    End of Session Equipment Utilized During Treatment: Gait belt;Oxygen Activity Tolerance: Patient limited by fatigue;Other (comment)(SOB) Patient left: in bed;with call bell/phone within reach;with family/visitor present Nurse Communication: Mobility status PT Visit Diagnosis: Other abnormalities of gait and mobility (R26.89);Muscle weakness (generalized) (M62.81);Difficulty in walking, not elsewhere classified (R26.2)     Time: 1040-1105 PT Time Calculation (min) (ACUTE ONLY): 25 min  Charges:   $Gait Training: 23-37 mins                     Jonathon Tan, PT, GCS 02/21/20,11:58 AM

## 2020-02-21 NOTE — TOC Progression Note (Signed)
Transition of Care [facility]) - Progression Note    Patient Details  Name: SULEYMA WAFER MRN: [id number] Date of Birth: Mar 28, 1935  Transition of Care [facility]) CM/SW [place], RN Phone Number: 02/21/2020, 10:59 AM  Clinical Narrative:     Spoke with patient and daughter, they are in agreement with [facility] PT services.  [place] will be providing this at discharge.      Expected Discharge Plan: [place] Barriers to Discharge: Continued Medical Work up  Expected Discharge Plan and Services Expected Discharge Plan: [place]   Discharge Planning Services: CM Consult Post Acute Care Choice: [place] arrangements for the past 2 months: Single Family Home                                       Social Determinants of Health (SDOH) Interventions    Readmission Risk Interventions Readmission Risk Prevention Plan 10/15/2018  Transportation Screening Complete  PCP or Specialist Appt within 5-7 Days Complete  Home Care Screening Complete  Medication Review (RN CM) Complete  Some recent data might be hidden

## 2020-02-21 NOTE — Consult Note (Signed)
[place] for Heparin Indication: chest pain/ACS  Allergies  Allergen Reactions  . Saxagliptin Diarrhea  . Epinephrine Other (See Comments)    Patient does not remember what happens when she uses this  . Atorvastatin Other (See Comments)    Muscle aches  . Codeine Other (See Comments)    Upset stomach  . Ezetimibe Other (See Comments)    Myalgias(ZETIA)  . Limonene Rash    Patient does not recall this reaction  . Nitrofurantoin Rash and Other (See Comments)    Pruitus  . Sulfa Antibiotics Rash and Other (See Comments)    Sore mouth     Patient Measurements: Height: 5' (152.4 cm) Weight: 57.8 kg (127 lb 8 oz) IBW/kg (Calculated) : 45.5 Heparin Dosing Weight: 56.7 kg  Vital Signs: Temp: 98.2 F (36.8 C) (04/28 1150) Temp Source: Oral (04/28 1150) BP: 132/53 (04/28 1150) Pulse Rate: 85 (04/28 1150)  Labs: Recent Labs    02/19/20 0506 02/19/20 0506 02/19/20 1525 02/20/20 0411 02/20/20 0917 02/20/20 0932 02/20/20 1121 02/20/20 2001 02/21/20 0545 02/21/20 1417  HGB 9.3*   < >  --   --  9.3*  --   --   --  9.4*  --   HCT 28.0*  --   --   --  29.1*  --   --   --  29.7*  --   PLT 268  --   --   --  339  --   --   --  302  --   APTT  --   --   --   --   --  31  --   --   --   --   LABPROT  --   --   --   --   --  15.4*  --   --   --   --   INR  --   --   --   --   --  1.2  --   --   --   --   HEPARINUNFRC  --   --   --   --   --   --   --  1.20* 0.55 0.40  CREATININE 1.85*  --   --  2.33*  --   --   --   --  2.72*  --   TROPONINIHS  --   --  3,082*  --  1,409*  --  1,327*  --   --   --    < > = values in this interval not displayed.    Estimated Creatinine Clearance: 12.5 mL/min (A) (by C-G formula based on SCr of 2.72 mg/dL (H)).   Medical History: Past Medical History:  Diagnosis Date  . Allergies   . Anxiety   . Arthritis    spine and shoulder  . Atherosclerosis of artery of extremity with rest pain ([place])  10/12/2018  . Cancer ([place])    skin  . Diabetes mellitus without complication ([place])   . Heart murmur   . Hyperlipidemia   . Hypertension   . Macula lutea degeneration   . Mitral and aortic valve disease   . Myocardial infarction [facility])    may have had a "light" heart attack  . Occasional tremors   . PAD (peripheral artery disease) ([place])   . Shingles    patient unaware but daughter confirms. it was a long time ago  . Stroke [facility]) 01/2017  may have had a slight stroke  . TIA (transient ischemic attack) 01/2017  . UTI (urinary tract infection)   . Vascular disease, peripheral ([place])     Medications:  Medications Prior to Admission  Medication Sig Dispense Refill Last Dose  . acetaminophen (TYLENOL) 650 MG CR tablet Take 1,300 mg by mouth every 8 (eight) hours as needed for pain.   prn at prn  . baclofen (LIORESAL) 10 MG tablet TAKE 1 TABLET(10 MG) BY MOUTH THREE TIMES DAILY (Patient taking differently: Take 10 mg by mouth daily as needed. ) 270 tablet 0 prn at prn  . chlorthalidone (HYGROTON) 25 MG tablet Take 25 mg by mouth daily.    Unknown at Unknown  . clopidogrel (PLAVIX) 75 MG tablet TAKE 1 TABLET BY MOUTH EVERY DAY (Patient taking differently: Take 37.5 mg by mouth daily. ) 30 tablet 1 Unknown at Unknown  . Cyanocobalamin (RA VITAMIN B-12 TR) 1000 MCG TBCR Take 1,000 mcg by mouth daily.    Unknown at Unknown  . diltiazem (CARTIA XT) 180 MG 24 hr capsule Take 180 mg by mouth 2 (two) times daily.    Unknown at Unknown  . ferrous sulfate 325 (65 FE) MG tablet Take 325 mg by mouth daily with breakfast.   Unknown at Unknown  . gabapentin (NEURONTIN) 100 MG capsule TAKE 1 TO 2 CAPSULES(100 TO 200 MG) BY MOUTH AT BEDTIME (Patient taking differently: Take 100-200 mg by mouth at bedtime. ) 180 capsule 6 Unknown at Unknown  . insulin aspart (NOVOLOG FLEXPEN) 100 UNIT/ML FlexPen Inject 3 Units into the skin 3 (three) times daily with meals. At lunch (Patient taking differently: Inject 2 Units  into the skin daily. At breakfast) 15 mL 3 Unknown at Unknown  . Insulin Glargine (BASAGLAR KWIKPEN) 100 UNIT/ML SOPN Inject 0.12 mLs (12 Units total) into the skin daily. 15 mL 3 Unknown at Unknown  . losartan (COZAAR) 50 MG tablet Take 50 mg by mouth daily.    Unknown at Unknown  . metFORMIN (GLUCOPHAGE-XR) 500 MG 24 hr tablet TAKE 1 TABLET BY MOUTH TWICE DAILY 60 tablet 11 Unknown at Unknown  . Multiple Vitamins-Minerals (PRESERVISION AREDS PO) Take 1 capsule by mouth 2 (two) times daily.    Unknown at Unknown  . nitroGLYCERIN (NITROSTAT) 0.4 MG SL tablet 1 tab under the tongue for CP, may repeat in 5 minutes up to 3 doses 25 tablet 5 prn at prn  . rosuvastatin (CRESTOR) 5 MG tablet Take 5 mg by mouth every other day. Mon, Wed, Fri and Sat.   Unknown at Unknown  . B-D ULTRAFINE III SHORT PEN 31G X 8 MM MISC USE AS DIRECTED 100 each 3   . feeding supplement, GLUCERNA SHAKE, (GLUCERNA SHAKE) LIQD Take 237 mLs by mouth 3 (three) times daily between meals. 21330 mL 0   . pantoprazole (PROTONIX) 40 MG tablet Take 1 tablet (40 mg total) by mouth daily. (Patient not taking: Reported on 02/18/2020) 30 tablet 0 Not Taking at Unknown   Scheduled:  . azithromycin  250 mg Oral Daily  . budesonide (PULMICORT) nebulizer solution  0.5 mg Nebulization BID  . clopidogrel  37.5 mg Oral Daily  . feeding supplement (GLUCERNA SHAKE)  237 mL Oral TID BM  . ferrous sulfate  325 mg Oral Q breakfast  . furosemide  20 mg Intravenous Q12H  . gabapentin  100 mg Oral QHS  . insulin aspart  0-5 Units Subcutaneous QHS  . insulin aspart  0-9 Units Subcutaneous TID  WC  . insulin aspart  3 Units Subcutaneous TID WC  . insulin glargine  10 Units Subcutaneous BID  . ipratropium-albuterol  3 mL Nebulization Q4H  . melatonin  2.5 mg Oral QHS  . methylPREDNISolone (SOLU-MEDROL) injection  40 mg Intravenous Q12H  . metoprolol tartrate  25 mg Oral BID  . multivitamin-lutein  1 capsule Oral BID  . rosuvastatin  5 mg Oral Once  per day on Mon Wed Fri Sat  . sodium chloride flush  3 mL Intravenous Q12H   Infusions:  . heparin 550 Units/hr (02/21/20 0415)   PRN: acetaminophen, albuterol, baclofen, clonazepam, dextromethorphan-guaiFENesin, hydrALAZINE, morphine injection, nitroGLYCERIN, ondansetron (ZOFRAN) IV Anti-infectives (From admission, onward)   Start     Dose/Rate Route Frequency Ordered Stop   02/21/20 1800  azithromycin (ZITHROMAX) tablet 250 mg     250 mg Oral Daily 02/20/20 1056 02/25/20 0959   02/20/20 1800  azithromycin (ZITHROMAX) tablet 500 mg     500 mg Oral Daily-1800 02/20/20 1056 02/20/20 1851   02/19/20 1000  doxycycline (VIBRAMYCIN) 100 mg in sodium chloride 0.9 % 250 mL IVPB  Status:  Discontinued     100 mg 125 mL/hr over 120 Minutes Intravenous Every 12 hours 02/19/20 0818 02/20/20 1032   02/18/20 0630  cefTRIAXone (ROCEPHIN) 1 g in sodium chloride 0.9 % 100 mL IVPB     1 g 200 mL/hr over 30 Minutes Intravenous  Once 02/18/20 0616 02/18/20 0809   02/18/20 0630  azithromycin (ZITHROMAX) 500 mg in sodium chloride 0.9 % 250 mL IVPB     500 mg 250 mL/hr over 60 Minutes Intravenous  Once 02/18/20 0616 02/18/20 0810      Assessment: Pharmacy consulted to start heparin for ACS. NO DOAC PTA noted. Baseline labs ordered.   4/28 0545 HL 0.55  4/28 1417 HL 0.4   Goal of Therapy:  Heparin level 0.3-0.7 units/ml Monitor platelets by anticoagulation protocol: Yes   Plan:  Heparin level is therapeutic.  CBC stable.  Will continue heparin at current rate and recheck HL and CBC with AM labs.  Oswald Hillock, PharmD, BCPS 02/21/2020,3:29 PM

## 2020-02-22 DIAGNOSIS — N184 Chronic kidney disease, stage 4 (severe): Secondary | ICD-10-CM

## 2020-02-22 DIAGNOSIS — N189 Chronic kidney disease, unspecified: Secondary | ICD-10-CM

## 2020-02-22 DIAGNOSIS — B370 Candidal stomatitis: Secondary | ICD-10-CM

## 2020-02-22 DIAGNOSIS — I5031 Acute diastolic (congestive) heart failure: Secondary | ICD-10-CM | POA: Diagnosis not present

## 2020-02-22 DIAGNOSIS — I272 Pulmonary hypertension, unspecified: Secondary | ICD-10-CM

## 2020-02-22 DIAGNOSIS — E1122 Type 2 diabetes mellitus with diabetic chronic kidney disease: Secondary | ICD-10-CM

## 2020-02-22 DIAGNOSIS — N179 Acute kidney failure, unspecified: Secondary | ICD-10-CM | POA: Diagnosis not present

## 2020-02-22 DIAGNOSIS — E871 Hypo-osmolality and hyponatremia: Secondary | ICD-10-CM

## 2020-02-22 DIAGNOSIS — D631 Anemia in chronic kidney disease: Secondary | ICD-10-CM

## 2020-02-22 LAB — BASIC METABOLIC PANEL
Anion gap: 13 (ref 5–15)
BUN: 108 mg/dL — ABNORMAL HIGH (ref 8–23)
CO2: 20 mmol/L — ABNORMAL LOW (ref 22–32)
Calcium: 9 mg/dL (ref 8.9–10.3)
Chloride: 96 mmol/L — ABNORMAL LOW (ref 98–111)
Creatinine, Ser: 2.74 mg/dL — ABNORMAL HIGH (ref 0.44–1.00)
GFR calc Af Amer: 18 mL/min — ABNORMAL LOW (ref >60)
GFR calc non Af Amer: 15 mL/min — ABNORMAL LOW (ref >60)
Glucose, Bld: 156 mg/dL — ABNORMAL HIGH (ref 70–99)
Potassium: 4.7 mmol/L (ref 3.5–5.1)
Sodium: 129 mmol/L — ABNORMAL LOW (ref 135–145)

## 2020-02-22 LAB — CBC WITH DIFFERENTIAL/PLATELET
Abs Immature Granulocytes: 0.43 10*3/uL — ABNORMAL HIGH (ref 0.00–0.07)
Basophils Absolute: 0 10*3/uL (ref 0.0–0.1)
Basophils Relative: 0 %
Eosinophils Absolute: 0 10*3/uL (ref 0.0–0.5)
Eosinophils Relative: 0 %
HCT: 29.3 % — ABNORMAL LOW (ref 36.0–46.0)
Hemoglobin: 9.8 g/dL — ABNORMAL LOW (ref 12.0–15.0)
Immature Granulocytes: 3 %
Lymphocytes Relative: 8 %
Lymphs Abs: 1.2 10*3/uL (ref 0.7–4.0)
MCH: 29.5 pg (ref 26.0–34.0)
MCHC: 33.4 g/dL (ref 30.0–36.0)
MCV: 88.3 fL (ref 80.0–100.0)
Monocytes Absolute: 0.6 10*3/uL (ref 0.1–1.0)
Monocytes Relative: 4 %
Neutro Abs: 13.3 10*3/uL — ABNORMAL HIGH (ref 1.7–7.7)
Neutrophils Relative %: 85 %
Platelets: 287 10*3/uL (ref 150–400)
RBC: 3.32 MIL/uL — ABNORMAL LOW (ref 3.87–5.11)
RDW: 13.7 % (ref 11.5–15.5)
WBC: 15.5 10*3/uL — ABNORMAL HIGH (ref 4.0–10.5)
nRBC: 0.3 % — ABNORMAL HIGH (ref 0.0–0.2)

## 2020-02-22 LAB — GLUCOSE, CAPILLARY
Glucose-Capillary: 151 mg/dL — ABNORMAL HIGH (ref 70–99)
Glucose-Capillary: 169 mg/dL — ABNORMAL HIGH (ref 70–99)
Glucose-Capillary: 218 mg/dL — ABNORMAL HIGH (ref 70–99)
Glucose-Capillary: 125 mg/dL — ABNORMAL HIGH (ref 70–99)
Glucose-Capillary: 206 mg/dL — ABNORMAL HIGH (ref 70–99)

## 2020-02-22 LAB — HEPARIN LEVEL (UNFRACTIONATED): Heparin Unfractionated: 0.31 IU/mL (ref 0.30–0.70)

## 2020-02-22 MED ORDER — INSULIN GLARGINE 100 UNIT/ML ~~LOC~~ SOLN
10.0000 [IU] | Freq: Every day | SUBCUTANEOUS | Status: DC
  Administered 2020-02-23: 10 [IU] via SUBCUTANEOUS
  Filled 2020-02-22 (×2): qty 0.1

## 2020-02-22 MED ORDER — IPRATROPIUM-ALBUTEROL 0.5-2.5 (3) MG/3ML IN SOLN
3.0000 mL | Freq: Four times a day (QID) | RESPIRATORY_TRACT | Status: DC
  Administered 2020-02-22 – 2020-02-24 (×7): 3 mL via RESPIRATORY_TRACT
  Filled 2020-02-22 (×8): qty 3

## 2020-02-22 MED ORDER — MAGIC MOUTHWASH
5.0000 mL | Freq: Three times a day (TID) | ORAL | Status: DC
  Administered 2020-02-22 – 2020-02-26 (×13): 5 mL via ORAL
  Filled 2020-02-22 (×5): qty 5
  Filled 2020-02-22: qty 10
  Filled 2020-02-22 (×5): qty 5
  Filled 2020-02-22: qty 10
  Filled 2020-02-22 (×5): qty 5
  Filled 2020-02-22: qty 10

## 2020-02-22 MED ORDER — METHYLPREDNISOLONE SODIUM SUCC 40 MG IJ SOLR
40.0000 mg | INTRAMUSCULAR | Status: DC
  Administered 2020-02-23 – 2020-02-24 (×2): 40 mg via INTRAVENOUS
  Filled 2020-02-22 (×2): qty 1

## 2020-02-22 MED ORDER — FUROSEMIDE 10 MG/ML IJ SOLN
40.0000 mg | Freq: Two times a day (BID) | INTRAMUSCULAR | Status: DC
  Administered 2020-02-22 – 2020-02-23 (×3): 40 mg via INTRAVENOUS
  Filled 2020-02-22 (×3): qty 4

## 2020-02-22 NOTE — Progress Notes (Signed)
PT Cancellation Note  Patient Details Name: Jill Hudson MRN: [id number] DOB: May 14, 1935   Cancelled Treatment:    Reason Eval/Treat Not Completed: Patient declined, no reason specified. Patient received sleeping in bed, reports she is too tired this afternoon for PT. Will re-attempt tomorrow.    Makiah Clauson 02/22/2020, 2:39 PM

## 2020-02-22 NOTE — Progress Notes (Signed)
Patient ID: Jill Hudson, female   DOB: [birth year], 85 y.o.   MRN: [id number] [facility] PROGRESS NOTE  GUINEVERE STEPHENSON VWP:[id number] DOB: 09/11/1935 DOA: 02/18/2020 PCP: [name], MD  HPI/Subjective: Patient breathing a little bit better.  Still with some shortness of breath and cough.  Still feeling fatigued.  Objective: Vitals:   02/22/20 1115 02/22/20 1414  BP: 126/73   Pulse: 81   Resp: 18   Temp: 97.6 F (36.4 C)   SpO2: 100% 100%    Intake/Output Summary (Last 24 hours) at 02/22/2020 1512 Last data filed at 02/22/2020 1038 Gross per 24 hour  Intake 590.55 ml  Output 1700 ml  Net -1109.45 ml   Filed Weights   02/20/20 0413 02/21/20 0401 02/22/20 0500  Weight: 56.7 kg 57.8 kg 57.2 kg    ROS: Review of Systems  Constitutional: Positive for malaise/fatigue. Negative for fever.  Eyes: Negative for blurred vision.  Respiratory: Positive for cough and shortness of breath.   Cardiovascular: Negative for chest pain.  Gastrointestinal: Negative for abdominal pain, constipation, diarrhea, nausea and vomiting.  Genitourinary: Negative for dysuria.  Musculoskeletal: Negative for joint pain.  Neurological: Negative for dizziness and headaches.   Exam: Physical Exam  Constitutional: She is oriented to person, place, and time.  HENT:  Nose: No mucosal edema.  Mouth/Throat: No oropharyngeal exudate or posterior oropharyngeal edema.  Starting of thrush in the mouth  Eyes: Conjunctivae and lids are normal.  Neck: Carotid bruit is not present.  Cardiovascular: S1 normal and S2 normal. Exam reveals no gallop.  No murmur heard. Respiratory: No respiratory distress. She has decreased breath sounds in the right lower field and the left lower field. She has wheezes in the right middle field and the left middle field. She has rhonchi in the right lower field and the left lower field. She has no rales.  GI: Soft. Bowel sounds are normal. There is no abdominal tenderness.   Musculoskeletal:     Right ankle: No swelling.     Left ankle: No swelling.  Lymphadenopathy:    She has no cervical adenopathy.  Neurological: She is alert and oriented to person, place, and time. No cranial nerve deficit.  Skin: Skin is warm. No rash noted. Nails show no clubbing.  Psychiatric: She has a normal mood and affect.      Data Reviewed: Basic Metabolic Panel: Recent Labs  Lab 02/18/20 2302 02/19/20 0506 02/20/20 0411 02/21/20 0545 02/22/20 0442  NA 129* 129* 127* 127* 129*  K 4.7 5.1 4.8 5.5* 4.7  CL 97* 98 96* 96* 96*  CO2 18* 20* 17* 18* 20*  GLUCOSE 286* 206* 232* 237* 156*  BUN 49* 57* 77* 93* 108*  CREATININE 1.68* 1.85* 2.33* 2.72* 2.74*  CALCIUM 9.1 9.0 9.2 9.0 9.0  MG  --  2.1  --   --   --    Liver Function Tests: Recent Labs  Lab 02/21/20 1417  AST 33  ALT 50*  ALKPHOS 78  BILITOT 0.7  PROT 6.1*  ALBUMIN 3.5   CBC: Recent Labs  Lab 02/18/20 0517 02/19/20 0506 02/20/20 0917 02/21/20 0545 02/22/20 0442  WBC 17.2* 15.3* 25.2* 19.9* 15.5*  NEUTROABS 13.3*  --  22.5* 16.9* 13.3*  HGB 10.2* 9.3* 9.3* 9.4* 9.8*  HCT 31.7* 28.0* 29.1* 29.7* 29.3*  MCV 91.4 90.0 92.4 91.7 88.3  PLT 299 268 339 302 287   BNP (last 3 results) Recent Labs    02/18/20 0517  BNP  600.0*    CBG: Recent Labs  Lab 02/21/20 1713 02/21/20 2119 02/22/20 0716 02/22/20 0926 02/22/20 1118  GLUCAP 190* 234* 151* 169* 218*    Recent Results (from the past 240 hour(s))  Respiratory Panel by RT PCR (Flu A&B, Covid) - Nasopharyngeal Swab     Status: None   Collection Time: 02/18/20  5:17 AM   Specimen: Nasopharyngeal Swab  Result Value Ref Range Status   SARS Coronavirus 2 by RT PCR NEGATIVE NEGATIVE Final    Comment: (NOTE) SARS-CoV-2 target nucleic acids are NOT DETECTED. The SARS-CoV-2 RNA is generally detectable in upper respiratoy specimens during the acute phase of infection. The lowest concentration of SARS-CoV-2 viral copies this assay can  detect is 131 copies/mL. A negative result does not preclude SARS-Cov-2 infection and should not be used as the sole basis for treatment or other patient management decisions. A negative result may occur with  improper specimen collection/handling, submission of specimen other than nasopharyngeal swab, presence of viral mutation(s) within the areas targeted by this assay, and inadequate number of viral copies (<131 copies/mL). A negative result must be combined with clinical observations, patient history, and epidemiological information. The expected result is Negative. Fact Sheet for Patients:  PinkCheek.be Fact Sheet for Healthcare Providers:  GravelBags.it This test is not yet ap proved or cleared by the [place] FDA and  has been authorized for detection and/or diagnosis of SARS-CoV-2 by FDA under an Emergency Use Authorization (EUA). This EUA will remain  in effect (meaning this test can be used) for the duration of the COVID-19 declaration under Section 564(b)(1) of the Act, 21 U.S.C. section 360bbb-3(b)(1), unless the authorization is terminated or revoked sooner.    Influenza A by PCR NEGATIVE NEGATIVE Final   Influenza B by PCR NEGATIVE NEGATIVE Final    Comment: (NOTE) The Xpert Xpress SARS-CoV-2/FLU/RSV assay is intended as an aid in  the diagnosis of influenza from Nasopharyngeal swab specimens and  should not be used as a sole basis for treatment. Nasal washings and  aspirates are unacceptable for Xpert Xpress SARS-CoV-2/FLU/RSV  testing. Fact Sheet for Patients: PinkCheek.be Fact Sheet for Healthcare Providers: GravelBags.it This test is not yet approved or cleared by the [place] FDA and  has been authorized for detection and/or diagnosis of SARS-CoV-2 by  FDA under an Emergency Use Authorization (EUA). This EUA will remain  in effect (meaning  this test can be used) for the duration of the  Covid-19 declaration under Section 564(b)(1) of the Act, 21  U.S.C. section 360bbb-3(b)(1), unless the authorization is  terminated or revoked. Performed at [facility], [address] [place], [place] [address]   Blood culture (routine x 2)     Status: None (Preliminary result)   Collection Time: 02/18/20  6:29 AM   Specimen: BLOOD  Result Value Ref Range Status   Specimen Description BLOOD BLOOD LEFT HAND  Final   Special Requests   Final    BOTTLES DRAWN AEROBIC AND ANAEROBIC Blood Culture results may not be optimal due to an excessive volume of blood received in culture bottles   Culture   Final    NO GROWTH 4 DAYS Performed at [facility], [address] [place], [place] [address]    Report Status PENDING  Incomplete  Blood culture (routine x 2)     Status: None (Preliminary result)   Collection Time: 02/18/20  6:29 AM   Specimen: BLOOD  Result Value Ref Range Status   Specimen  Description BLOOD LEFT ANTECUBITAL  Final   Special Requests   Final    BOTTLES DRAWN AEROBIC AND ANAEROBIC Blood Culture adequate volume   Culture   Final    NO GROWTH 4 DAYS Performed at [facility], [address] [place], [place] [address]    Report Status PENDING  Incomplete  Urine culture     Status: None   Collection Time: 02/18/20 11:23 AM   Specimen: Urine, Random  Result Value Ref Range Status   Specimen Description   Final    URINE, RANDOM Performed at [facility], [address] [place], [place] [address]    Special Requests   Final    NONE Performed at [facility], [address], [place], [place] [address]    Culture   Final    NO GROWTH Performed at [facility], [place] [address], [place], [place] [address]    Report Status 02/19/2020 FINAL  Final     Studies: US RENAL  Result Date: 02/21/2020 CLINICAL DATA:  Acute kidney injury EXAM: RENAL / URINARY TRACT  ULTRASOUND COMPLETE COMPARISON:  None. FINDINGS: Right Kidney: Renal measurements: 9.1 x 4.1 x 4.7 cm = volume: 92 mL . Echogenicity is slightly increased. Renal cortical thickness is within normal limits. No mass, perinephric fluid, or hydronephrosis visualized. No sonographically demonstrable calculus or ureterectasis. Left Kidney: Renal measurements: 10.4 x 4.7 x 4.5 cm = volume: 112 mL. Echogenicity is slightly increased. Renal cortical thickness normal. No perinephric fluid or hydronephrosis visualized. There is a cyst in the upper pole left kidney measuring 3.3 x 2.9 x 2.5 cm. A second cyst in this area measures 2.5 x 1.8 x 1.4 cm. A cyst in the lower pole region on the left measures 1.6 x 1.3 x 1.2 cm. Bladder: Appears normal for degree of bladder distention. Other: Bilateral pleural effusions noted. IMPRESSION: 1. Renal echogenicity mildly increased bilaterally, a finding that may be indicative of a degree of medical renal disease. Renal cortical thickness within normal limits bilaterally. No obstructing focus in either kidney. 2.  There are cysts in the left kidney. 3.  Bilateral pleural effusions. Electronically Signed   By: Lowella Grip III M.D.   On: 02/21/2020 11:02   US Venous Img Lower Bilateral (DVT)  Result Date: 02/20/2020 CLINICAL DATA:  Bilateral lower extremity pain and edema. Hypoxia. Recent falls. Evaluate for DVT. EXAM: BILATERAL LOWER EXTREMITY VENOUS DOPPLER ULTRASOUND TECHNIQUE: Gray-scale sonography with graded compression, as well as color Doppler and duplex ultrasound were performed to evaluate the lower extremity deep venous systems from the level of the common femoral vein and including the common femoral, femoral, profunda femoral, popliteal and calf veins including the posterior tibial, peroneal and gastrocnemius veins when visible. The superficial great saphenous vein was also interrogated. Spectral Doppler was utilized to evaluate flow at rest and with distal augmentation  maneuvers in the common femoral, femoral and popliteal veins. COMPARISON:  None. FINDINGS: RIGHT LOWER EXTREMITY Common Femoral Vein: No evidence of thrombus. Normal compressibility, respiratory phasicity and response to augmentation. Saphenofemoral Junction: No evidence of thrombus. Normal compressibility and flow on color Doppler imaging. Profunda Femoral Vein: No evidence of thrombus. Normal compressibility and flow on color Doppler imaging. Femoral Vein: No evidence of thrombus. Normal compressibility, respiratory phasicity and response to augmentation. Popliteal Vein: No evidence of thrombus. Normal compressibility, respiratory phasicity and response to augmentation. Calf Veins: No evidence of thrombus. Normal compressibility and flow on color Doppler imaging. Superficial Great Saphenous  Vein: No evidence of thrombus. Normal compressibility. Venous Reflux:  None. Other Findings:  None. LEFT LOWER EXTREMITY Common Femoral Vein: No evidence of thrombus. Normal compressibility, respiratory phasicity and response to augmentation. Saphenofemoral Junction: No evidence of thrombus. Normal compressibility and flow on color Doppler imaging. Profunda Femoral Vein: No evidence of thrombus. Normal compressibility and flow on color Doppler imaging. Femoral Vein: No evidence of thrombus. Normal compressibility, respiratory phasicity and response to augmentation. Popliteal Vein: No evidence of thrombus. Normal compressibility, respiratory phasicity and response to augmentation. Calf Veins: No evidence of thrombus. Normal compressibility and flow on color Doppler imaging. Superficial Great Saphenous Vein: No evidence of thrombus. Normal compressibility. Venous Reflux:  None. Other Findings:  None. IMPRESSION: No evidence of DVT within either lower extremity. Electronically Signed   By: Sandi Mariscal M.D.   On: 02/20/2020 16:26    Scheduled Meds: . azithromycin  250 mg Oral Daily  . budesonide (PULMICORT) nebulizer solution   0.5 mg Nebulization BID  . clopidogrel  37.5 mg Oral Daily  . feeding supplement (GLUCERNA SHAKE)  237 mL Oral TID BM  . ferrous sulfate  325 mg Oral Q breakfast  . furosemide  40 mg Intravenous Q12H  . gabapentin  100 mg Oral QHS  . insulin aspart  0-5 Units Subcutaneous QHS  . insulin aspart  0-9 Units Subcutaneous TID WC  . insulin aspart  3 Units Subcutaneous TID WC  . [START ON 02/23/2020] insulin glargine  10 Units Subcutaneous Daily  . ipratropium-albuterol  3 mL Nebulization Q6H  . magic mouthwash  5 mL Oral TID  . melatonin  2.5 mg Oral QHS  . [START ON 02/23/2020] methylPREDNISolone (SOLU-MEDROL) injection  40 mg Intravenous Q24H  . metoprolol tartrate  25 mg Oral BID  . multivitamin-lutein  1 capsule Oral BID  . rosuvastatin  5 mg Oral Once per day on Mon Wed Fri Sat  . sodium chloride flush  3 mL Intravenous Q12H   Continuous Infusions: . heparin 550 Units/hr (02/22/20 0112)    Assessment/Plan:  1. Acute hypoxic respiratory failure.  Patient on BiPAP overnight and was on 4 L this morning and down to 3 L.  Asked nursing staff to keep on tapering off oxygen as much as possible. 2. NSTEMI.  Patient on heparin drip for 48 hours.  Patient on Plavix.  Cardiology would like to do a cardiac catheterization but with worsening kidney function will have to treat medically at this point.  Patient also on beta-blocker. 3. COPD exacerbation.  Taper steroids to once a day dosing.  Continue nebulizer treatments. 4. Acute diastolic congestive heart failure with worsening creatinine.  Started on IV Lasix yesterday. 5. Acute kidney injury on chronic kidney disease stage IIIa.  Creatinine seems to have stabilized at 2.74.  Check creatinine daily and need to watch closely with diuresis. 6. Hyponatremia secondary to congestive heart failure 7. Peripheral vascular disease on Plavix and Crestor 8. Type 2 diabetes mellitus with chronic kidney disease stage IIIa.  Since I decrease the steroid dose I  will decrease the Lantus dose to daily dosing. 9. Thrush we will start nystatin swish and swallow  Code Status:     Code Status Orders  (From admission, onward)         Start     Ordered   02/18/20 1100  Full code  Continuous     02/18/20 1059        Code Status History    Date Active Date Inactive Code  Status Order ID Comments User Context   12/25/2019 0555 12/28/2019 2204 Full Code [id number]  Sidney Ace Arvella Merles, MD ED   10/12/2018 1647 10/15/2018 1808 Full Code [id number]  Katha Cabal, MD Inpatient   09/16/2018 1254 09/16/2018 2048 Full Code [id number]  Katha Cabal, MD Inpatient   08/11/2018 1706 08/13/2018 2136 Full Code [id number]  Gorden Harms, MD Inpatient   07/26/2018 0921 07/26/2018 1652 Full Code [id number]  Katha Cabal, MD Inpatient   08/23/2017 0935 08/23/2017 1419 Full Code [id number]  Algernon Huxley, MD Inpatient   02/15/2017 1538 02/16/2017 1959 Full Code [id number]  Dustin Flock, MD Inpatient   Advance Care Planning Activity     Family Communication: Spoke with the daughter on the phone Disposition Plan: Since the patient has multiple organ systems involved, the patient will likely be in the hospital through the weekend for another few days.  Hopefully kidney function will start to improve tomorrow.  Patient moving better air so hopefully will count come off the oxygen soon.  Cardiology would like to do a cardiac catheterization but that will depend on kidney function.  Goal will be to go home with home health.  Consultants:  Cardiology  Nephrology  Time spent: 28 minutes  [place]

## 2020-02-22 NOTE — Consult Note (Signed)
[place] for Heparin Indication: chest pain/ACS  Allergies  Allergen Reactions  . Saxagliptin Diarrhea  . Epinephrine Other (See Comments)    Patient does not remember what happens when she uses this  . Atorvastatin Other (See Comments)    Muscle aches  . Codeine Other (See Comments)    Upset stomach  . Ezetimibe Other (See Comments)    Myalgias(ZETIA)  . Limonene Rash    Patient does not recall this reaction  . Nitrofurantoin Rash and Other (See Comments)    Pruitus  . Sulfa Antibiotics Rash and Other (See Comments)    Sore mouth     Patient Measurements: Height: 5' (152.4 cm) Weight: 57.2 kg (126 lb 3.2 oz) IBW/kg (Calculated) : 45.5 Heparin Dosing Weight: 56.7 kg  Vital Signs: Temp: 97.7 F (36.5 C) (04/29 0401) Temp Source: Oral (04/28 1913) BP: 137/65 (04/29 0401) Pulse Rate: 83 (04/29 0401)  Labs: Recent Labs     0000 02/19/20 1525 02/20/20 0411 02/20/20 0917 02/20/20 0932 02/20/20 1121 02/20/20 2001 02/21/20 0545 02/21/20 1417 02/22/20 0442  HGB   < >  --   --  9.3*  --   --   --  9.4*  --  9.8*  HCT  --   --   --  29.1*  --   --   --  29.7*  --  29.3*  PLT  --   --   --  339  --   --   --  302  --  287  APTT  --   --   --   --  31  --   --   --   --   --   LABPROT  --   --   --   --  15.4*  --   --   --   --   --   INR  --   --   --   --  1.2  --   --   --   --   --   HEPARINUNFRC  --   --   --   --   --   --    < > 0.55 0.40 0.31  CREATININE  --   --  2.33*  --   --   --   --  2.72*  --   --   TROPONINIHS  --  3,082*  --  1,409*  --  1,327*  --   --   --   --    < > = values in this interval not displayed.    Estimated Creatinine Clearance: 12.4 mL/min (A) (by C-G formula based on SCr of 2.72 mg/dL (H)).   Medical History: Past Medical History:  Diagnosis Date  . Allergies   . Anxiety   . Arthritis    spine and shoulder  . Atherosclerosis of artery of extremity with rest pain ([place]) 10/12/2018  .  Cancer ([place])    skin  . Diabetes mellitus without complication ([place])   . Heart murmur   . Hyperlipidemia   . Hypertension   . Macula lutea degeneration   . Mitral and aortic valve disease   . Myocardial infarction [facility])    may have had a "light" heart attack  . Occasional tremors   . PAD (peripheral artery disease) ([place])   . Shingles    patient unaware but daughter confirms. it was a long time ago  . Stroke [facility])  01/2017   may have had a slight stroke  . TIA (transient ischemic attack) 01/2017  . UTI (urinary tract infection)   . Vascular disease, peripheral ([place])     Medications:  Medications Prior to Admission  Medication Sig Dispense Refill Last Dose  . acetaminophen (TYLENOL) 650 MG CR tablet Take 1,300 mg by mouth every 8 (eight) hours as needed for pain.   prn at prn  . baclofen (LIORESAL) 10 MG tablet TAKE 1 TABLET(10 MG) BY MOUTH THREE TIMES DAILY (Patient taking differently: Take 10 mg by mouth daily as needed. ) 270 tablet 0 prn at prn  . chlorthalidone (HYGROTON) 25 MG tablet Take 25 mg by mouth daily.    Unknown at Unknown  . clopidogrel (PLAVIX) 75 MG tablet TAKE 1 TABLET BY MOUTH EVERY DAY (Patient taking differently: Take 37.5 mg by mouth daily. ) 30 tablet 1 Unknown at Unknown  . Cyanocobalamin (RA VITAMIN B-12 TR) 1000 MCG TBCR Take 1,000 mcg by mouth daily.    Unknown at Unknown  . diltiazem (CARTIA XT) 180 MG 24 hr capsule Take 180 mg by mouth 2 (two) times daily.    Unknown at Unknown  . ferrous sulfate 325 (65 FE) MG tablet Take 325 mg by mouth daily with breakfast.   Unknown at Unknown  . gabapentin (NEURONTIN) 100 MG capsule TAKE 1 TO 2 CAPSULES(100 TO 200 MG) BY MOUTH AT BEDTIME (Patient taking differently: Take 100-200 mg by mouth at bedtime. ) 180 capsule 6 Unknown at Unknown  . insulin aspart (NOVOLOG FLEXPEN) 100 UNIT/ML FlexPen Inject 3 Units into the skin 3 (three) times daily with meals. At lunch (Patient taking differently: Inject 2 Units into the skin  daily. At breakfast) 15 mL 3 Unknown at Unknown  . Insulin Glargine (BASAGLAR KWIKPEN) 100 UNIT/ML SOPN Inject 0.12 mLs (12 Units total) into the skin daily. 15 mL 3 Unknown at Unknown  . losartan (COZAAR) 50 MG tablet Take 50 mg by mouth daily.    Unknown at Unknown  . metFORMIN (GLUCOPHAGE-XR) 500 MG 24 hr tablet TAKE 1 TABLET BY MOUTH TWICE DAILY 60 tablet 11 Unknown at Unknown  . Multiple Vitamins-Minerals (PRESERVISION AREDS PO) Take 1 capsule by mouth 2 (two) times daily.    Unknown at Unknown  . nitroGLYCERIN (NITROSTAT) 0.4 MG SL tablet 1 tab under the tongue for CP, may repeat in 5 minutes up to 3 doses 25 tablet 5 prn at prn  . rosuvastatin (CRESTOR) 5 MG tablet Take 5 mg by mouth every other day. Mon, Wed, Fri and Sat.   Unknown at Unknown  . B-D ULTRAFINE III SHORT PEN 31G X 8 MM MISC USE AS DIRECTED 100 each 3   . feeding supplement, GLUCERNA SHAKE, (GLUCERNA SHAKE) LIQD Take 237 mLs by mouth 3 (three) times daily between meals. 21330 mL 0   . pantoprazole (PROTONIX) 40 MG tablet Take 1 tablet (40 mg total) by mouth daily. (Patient not taking: Reported on 02/18/2020) 30 tablet 0 Not Taking at Unknown   Scheduled:  . azithromycin  250 mg Oral Daily  . budesonide (PULMICORT) nebulizer solution  0.5 mg Nebulization BID  . clopidogrel  37.5 mg Oral Daily  . feeding supplement (GLUCERNA SHAKE)  237 mL Oral TID BM  . ferrous sulfate  325 mg Oral Q breakfast  . furosemide  20 mg Intravenous Q12H  . gabapentin  100 mg Oral QHS  . insulin aspart  0-5 Units Subcutaneous QHS  . insulin aspart  0-9  Units Subcutaneous TID WC  . insulin aspart  3 Units Subcutaneous TID WC  . insulin glargine  10 Units Subcutaneous BID  . ipratropium-albuterol  3 mL Nebulization Q4H  . melatonin  2.5 mg Oral QHS  . methylPREDNISolone (SOLU-MEDROL) injection  40 mg Intravenous Q12H  . metoprolol tartrate  25 mg Oral BID  . multivitamin-lutein  1 capsule Oral BID  . rosuvastatin  5 mg Oral Once per day on Mon  Wed Fri Sat  . sodium chloride flush  3 mL Intravenous Q12H   Infusions:  . heparin 550 Units/hr (02/22/20 0112)   PRN: acetaminophen, albuterol, baclofen, clonazepam, dextromethorphan-guaiFENesin, hydrALAZINE, morphine injection, nitroGLYCERIN, ondansetron (ZOFRAN) IV Anti-infectives (From admission, onward)   Start     Dose/Rate Route Frequency Ordered Stop   02/21/20 1800  azithromycin (ZITHROMAX) tablet 250 mg     250 mg Oral Daily 02/20/20 1056 02/25/20 0959   02/20/20 1800  azithromycin (ZITHROMAX) tablet 500 mg     500 mg Oral Daily-1800 02/20/20 1056 02/20/20 1851   02/19/20 1000  doxycycline (VIBRAMYCIN) 100 mg in sodium chloride 0.9 % 250 mL IVPB  Status:  Discontinued     100 mg 125 mL/hr over 120 Minutes Intravenous Every 12 hours 02/19/20 0818 02/20/20 1032   02/18/20 0630  cefTRIAXone (ROCEPHIN) 1 g in sodium chloride 0.9 % 100 mL IVPB     1 g 200 mL/hr over 30 Minutes Intravenous  Once 02/18/20 0616 02/18/20 0809   02/18/20 0630  azithromycin (ZITHROMAX) 500 mg in sodium chloride 0.9 % 250 mL IVPB     500 mg 250 mL/hr over 60 Minutes Intravenous  Once 02/18/20 0616 02/18/20 0810      Assessment: Pharmacy consulted to start heparin for ACS. NO DOAC PTA noted. Baseline labs ordered.   4/28 0545 HL 0.55  4/28 1417 HL 0.4  4/29 0442 HL 0.31, therapeutic  Goal of Therapy:  Heparin level 0.3-0.7 units/ml Monitor platelets by anticoagulation protocol: Yes   Plan:  Heparin level is therapeutic.  CBC stable.  Will continue heparin at current rate and recheck HL and CBC with AM labs.  Ena Dawley, PharmD 02/22/2020,5:51 AM

## 2020-02-22 NOTE — Progress Notes (Signed)
[facility]  ROUNDING NOTE   Subjective:   BIPAP overnight. Patient states she is breathing better. UOP 1350.   Objective:  Vital signs in last 24 hours:  Temp:  [97.5 F (36.4 C)-97.7 F (36.5 C)] 97.6 F (36.4 C) (04/29 1115) Pulse Rate:  [81-88] 81 (04/29 1115) Resp:  [15-22] 18 (04/29 1115) BP: (126-144)/(61-73) 126/73 (04/29 1115) SpO2:  [95 %-100 %] 100 % (04/29 1115) FiO2 (%):  [45 %] 45 % (04/29 0811) Weight:  [57.2 kg] 57.2 kg (04/29 0500)  Weight change: -0.59 kg Filed Weights   02/20/20 0413 02/21/20 0401 02/22/20 0500  Weight: 56.7 kg 57.8 kg 57.2 kg    Intake/Output: I/O last 3 completed shifts: In: 1333.1 [P.O.:1200; I.V.:133.1] Out: 1350 [Urine:1350]   Intake/Output this shift:  Total I/O In: 130 [P.O.:120; I.V.:10] Out: 350 [Urine:350]  Physical Exam: General: NAD,   Head: +BIPAP  Eyes: Anicteric, PERRL  Neck: Supple, trachea midline  Lungs:  Bilateral crackles at bases. BIPAP  Heart: Regular rate and rhythm  Abdomen:  Soft, nontender,   Extremities:  trace peripheral edema.  Neurologic: Nonfocal, moving all four extremities  Skin: No lesions        Basic Metabolic Panel: Recent Labs  Lab 02/18/20 2302 02/18/20 2302 02/19/20 0506 02/19/20 0506 02/20/20 0411 02/21/20 0545 02/22/20 0442  NA 129*  --  129*  --  127* 127* 129*  K 4.7  --  5.1  --  4.8 5.5* 4.7  CL 97*  --  98  --  96* 96* 96*  CO2 18*  --  20*  --  17* 18* 20*  GLUCOSE 286*  --  206*  --  232* 237* 156*  BUN 49*  --  57*  --  77* 93* 108*  CREATININE 1.68*  --  1.85*  --  2.33* 2.72* 2.74*  CALCIUM 9.1   < > 9.0   < > 9.2 9.0 9.0  MG  --   --  2.1  --   --   --   --    < > = values in this interval not displayed.    Liver Function Tests: Recent Labs  Lab 02/21/20 1417  AST 33  ALT 50*  ALKPHOS 78  BILITOT 0.7  PROT 6.1*  ALBUMIN 3.5   No results for input(s): LIPASE, AMYLASE in the last 168 hours. No results for input(s): AMMONIA in the last  168 hours.  CBC: Recent Labs  Lab 02/18/20 0517 02/19/20 0506 02/20/20 0917 02/21/20 0545 02/22/20 0442  WBC 17.2* 15.3* 25.2* 19.9* 15.5*  NEUTROABS 13.3*  --  22.5* 16.9* 13.3*  HGB 10.2* 9.3* 9.3* 9.4* 9.8*  HCT 31.7* 28.0* 29.1* 29.7* 29.3*  MCV 91.4 90.0 92.4 91.7 88.3  PLT 299 268 339 302 287    Cardiac Enzymes: No results for input(s): CKTOTAL, CKMB, CKMBINDEX, TROPONINI in the last 168 hours.  BNP: Invalid input(s): POCBNP  CBG: Recent Labs  Lab 02/21/20 1713 02/21/20 2119 02/22/20 0716 02/22/20 0926 02/22/20 1118  GLUCAP 190* 234* 151* 169* 218*    Microbiology: Results for orders placed or performed during the hospital encounter of 02/18/20  Respiratory Panel by RT PCR (Flu A&B, Covid) - Nasopharyngeal Swab     Status: None   Collection Time: 02/18/20  5:17 AM   Specimen: Nasopharyngeal Swab  Result Value Ref Range Status   SARS Coronavirus 2 by RT PCR NEGATIVE NEGATIVE Final    Comment: (NOTE) SARS-CoV-2 target nucleic acids are  NOT DETECTED. The SARS-CoV-2 RNA is generally detectable in upper respiratoy specimens during the acute phase of infection. The lowest concentration of SARS-CoV-2 viral copies this assay can detect is 131 copies/mL. A negative result does not preclude SARS-Cov-2 infection and should not be used as the sole basis for treatment or other patient management decisions. A negative result may occur with  improper specimen collection/handling, submission of specimen other than nasopharyngeal swab, presence of viral mutation(s) within the areas targeted by this assay, and inadequate number of viral copies (<131 copies/mL). A negative result must be combined with clinical observations, patient history, and epidemiological information. The expected result is Negative. Fact Sheet for Patients:  PinkCheek.be Fact Sheet for Healthcare Providers:  GravelBags.it This test is not  yet ap proved or cleared by the [place] FDA and  has been authorized for detection and/or diagnosis of SARS-CoV-2 by FDA under an Emergency Use Authorization (EUA). This EUA will remain  in effect (meaning this test can be used) for the duration of the COVID-19 declaration under Section 564(b)(1) of the Act, 21 U.S.C. section 360bbb-3(b)(1), unless the authorization is terminated or revoked sooner.    Influenza A by PCR NEGATIVE NEGATIVE Final   Influenza B by PCR NEGATIVE NEGATIVE Final    Comment: (NOTE) The Xpert Xpress SARS-CoV-2/FLU/RSV assay is intended as an aid in  the diagnosis of influenza from Nasopharyngeal swab specimens and  should not be used as a sole basis for treatment. Nasal washings and  aspirates are unacceptable for Xpert Xpress SARS-CoV-2/FLU/RSV  testing. Fact Sheet for Patients: PinkCheek.be Fact Sheet for Healthcare Providers: GravelBags.it This test is not yet approved or cleared by the [place] FDA and  has been authorized for detection and/or diagnosis of SARS-CoV-2 by  FDA under an Emergency Use Authorization (EUA). This EUA will remain  in effect (meaning this test can be used) for the duration of the  Covid-19 declaration under Section 564(b)(1) of the Act, 21  U.S.C. section 360bbb-3(b)(1), unless the authorization is  terminated or revoked. Performed at [facility], [address] [place], [place] [address]   Blood culture (routine x 2)     Status: None (Preliminary result)   Collection Time: 02/18/20  6:29 AM   Specimen: BLOOD  Result Value Ref Range Status   Specimen Description BLOOD BLOOD LEFT HAND  Final   Special Requests   Final    BOTTLES DRAWN AEROBIC AND ANAEROBIC Blood Culture results may not be optimal due to an excessive volume of blood received in culture bottles   Culture   Final    NO GROWTH 4 DAYS Performed at [facility], [address], [place], [place] [address]    Report Status PENDING  Incomplete  Blood culture (routine x 2)     Status: None (Preliminary result)   Collection Time: 02/18/20  6:29 AM   Specimen: BLOOD  Result Value Ref Range Status   Specimen Description BLOOD LEFT ANTECUBITAL  Final   Special Requests   Final    BOTTLES DRAWN AEROBIC AND ANAEROBIC Blood Culture adequate volume   Culture   Final    NO GROWTH 4 DAYS Performed at [facility], [address] [place], [place] [address]    Report Status PENDING  Incomplete  Urine culture     Status: None   Collection Time: 02/18/20 11:23 AM   Specimen: Urine, Random  Result Value Ref Range Status   Specimen Description   Final  URINE, RANDOM Performed at [facility], [address] [place], [place] [address]    Special Requests   Final    NONE Performed at [facility], [address] [place], [place] [address]    Culture   Final    NO GROWTH Performed at [facility], [place] [address], [place], [place] [address]    Report Status 02/19/2020 FINAL  Final    Coagulation Studies: Recent Labs    02/20/20 0932  LABPROT 15.4*  INR 1.2    Urinalysis: No results for input(s): COLORURINE, LABSPEC, PHURINE, GLUCOSEU, HGBUR, BILIRUBINUR, KETONESUR, PROTEINUR, UROBILINOGEN, NITRITE, LEUKOCYTESUR in the last 72 hours.  Invalid input(s): APPERANCEUR    Imaging: US RENAL  Result Date: 02/21/2020 CLINICAL DATA:  Acute kidney injury EXAM: RENAL / URINARY TRACT ULTRASOUND COMPLETE COMPARISON:  None. FINDINGS: Right Kidney: Renal measurements: 9.1 x 4.1 x 4.7 cm = volume: 92 mL . Echogenicity is slightly increased. Renal cortical thickness is within normal limits. No mass, perinephric fluid, or hydronephrosis visualized. No sonographically demonstrable calculus or ureterectasis. Left Kidney: Renal measurements: 10.4 x 4.7 x 4.5 cm = volume: 112 mL. Echogenicity is slightly increased. Renal cortical thickness  normal. No perinephric fluid or hydronephrosis visualized. There is a cyst in the upper pole left kidney measuring 3.3 x 2.9 x 2.5 cm. A second cyst in this area measures 2.5 x 1.8 x 1.4 cm. A cyst in the lower pole region on the left measures 1.6 x 1.3 x 1.2 cm. Bladder: Appears normal for degree of bladder distention. Other: Bilateral pleural effusions noted. IMPRESSION: 1. Renal echogenicity mildly increased bilaterally, a finding that may be indicative of a degree of medical renal disease. Renal cortical thickness within normal limits bilaterally. No obstructing focus in either kidney. 2.  There are cysts in the left kidney. 3.  Bilateral pleural effusions. Electronically Signed   By: Lowella Grip III M.D.   On: 02/21/2020 11:02   US Venous Img Lower Bilateral (DVT)  Result Date: 02/20/2020 CLINICAL DATA:  Bilateral lower extremity pain and edema. Hypoxia. Recent falls. Evaluate for DVT. EXAM: BILATERAL LOWER EXTREMITY VENOUS DOPPLER ULTRASOUND TECHNIQUE: Gray-scale sonography with graded compression, as well as color Doppler and duplex ultrasound were performed to evaluate the lower extremity deep venous systems from the level of the common femoral vein and including the common femoral, femoral, profunda femoral, popliteal and calf veins including the posterior tibial, peroneal and gastrocnemius veins when visible. The superficial great saphenous vein was also interrogated. Spectral Doppler was utilized to evaluate flow at rest and with distal augmentation maneuvers in the common femoral, femoral and popliteal veins. COMPARISON:  None. FINDINGS: RIGHT LOWER EXTREMITY Common Femoral Vein: No evidence of thrombus. Normal compressibility, respiratory phasicity and response to augmentation. Saphenofemoral Junction: No evidence of thrombus. Normal compressibility and flow on color Doppler imaging. Profunda Femoral Vein: No evidence of thrombus. Normal compressibility and flow on color Doppler imaging.  Femoral Vein: No evidence of thrombus. Normal compressibility, respiratory phasicity and response to augmentation. Popliteal Vein: No evidence of thrombus. Normal compressibility, respiratory phasicity and response to augmentation. Calf Veins: No evidence of thrombus. Normal compressibility and flow on color Doppler imaging. Superficial Great Saphenous Vein: No evidence of thrombus. Normal compressibility. Venous Reflux:  None. Other Findings:  None. LEFT LOWER EXTREMITY Common Femoral Vein: No evidence of thrombus. Normal compressibility, respiratory phasicity and response to augmentation. Saphenofemoral Junction: No evidence of thrombus. Normal compressibility and flow on color Doppler imaging. Profunda Femoral Vein:  No evidence of thrombus. Normal compressibility and flow on color Doppler imaging. Femoral Vein: No evidence of thrombus. Normal compressibility, respiratory phasicity and response to augmentation. Popliteal Vein: No evidence of thrombus. Normal compressibility, respiratory phasicity and response to augmentation. Calf Veins: No evidence of thrombus. Normal compressibility and flow on color Doppler imaging. Superficial Great Saphenous Vein: No evidence of thrombus. Normal compressibility. Venous Reflux:  None. Other Findings:  None. IMPRESSION: No evidence of DVT within either lower extremity. Electronically Signed   By: Sandi Mariscal M.D.   On: 02/20/2020 16:26     Medications:   . heparin 550 Units/hr (02/22/20 0112)   . azithromycin  250 mg Oral Daily  . budesonide (PULMICORT) nebulizer solution  0.5 mg Nebulization BID  . clopidogrel  37.5 mg Oral Daily  . feeding supplement (GLUCERNA SHAKE)  237 mL Oral TID BM  . ferrous sulfate  325 mg Oral Q breakfast  . furosemide  40 mg Intravenous Q12H  . gabapentin  100 mg Oral QHS  . insulin aspart  0-5 Units Subcutaneous QHS  . insulin aspart  0-9 Units Subcutaneous TID WC  . insulin aspart  3 Units Subcutaneous TID WC  . insulin glargine   10 Units Subcutaneous BID  . ipratropium-albuterol  3 mL Nebulization Q6H  . magic mouthwash  5 mL Oral TID  . melatonin  2.5 mg Oral QHS  . [START ON 02/23/2020] methylPREDNISolone (SOLU-MEDROL) injection  40 mg Intravenous Q24H  . metoprolol tartrate  25 mg Oral BID  . multivitamin-lutein  1 capsule Oral BID  . rosuvastatin  5 mg Oral Once per day on Mon Wed Fri Sat  . sodium chloride flush  3 mL Intravenous Q12H   acetaminophen, albuterol, clonazepam, dextromethorphan-guaiFENesin, hydrALAZINE, morphine injection, nitroGLYCERIN, ondansetron (ZOFRAN) IV  Assessment/ Plan:  Ms. BRENDALIZ KUK is a 85 y.o. white female with hypertension, peripheral vascular disease, CVA/TIA, coronary artery disease, diabetes mellitus type II , who was admitted to [facility] on 02/18/2020 for Hyperglycemia [R73.9] Acute CHF (congestive heart failure) ([place]) [I50.9] Acute respiratory failure with hypoxia ([place]) [J96.01] Community acquired pneumonia, unspecified laterality [J18.9] Acute on chronic congestive heart failure, unspecified heart failure type ([place]) [I50.9]   1. Acute renal failure with hyperkalemia and metabolic acidosis: on chronic kidney disease stage IIIA with proteinuria.  Baseline creatinine of 1.1, GFR of 47 on 12/27/2019.  Chronic kidney disease secondary to hypertension and diabetes.  Acute renal failure most likely secondary to acute cardio-renal syndrome.  No IV contrast exposure. No obstruction on ultrasound.  No acute indication for dialysis - holding losartan  2. Hypertension: with acute exacerbation of diastolic congestive heart failure.  - IV furosemide : change to 40mg  IV q12.   3. Hyponatremia: with volume overload and chlorthalidone.  - hold chlorthalidone.    LOS: 4 Elizibeth Breau 4/29/202112:17 PM

## 2020-02-22 NOTE — Progress Notes (Addendum)
Progress Note  Patient Name: Jill Hudson Date of Encounter: 02/22/2020  Primary Cardiologist: Dr. Fletcher Anon rounding  Subjective   She is currently on 2L [place] oxygen and s/p BiPAP overnight. Reports she is still feeling SOB (not normally on home oxygen).  Reports abdominal distention, though this has been ongoing reportedly for 4-5 months.   No CP, racing HR, or palpitations.  As below, LHC precluded by renal function and RHC pending patient's ability to lay flat. She continues to note she is unable to lay flat. This was discussed with the patient, as well as the I/Os as below with -220cc yesterday and +1034.0cc for the admission.  Inpatient Medications    Scheduled Meds: . azithromycin  250 mg Oral Daily  . budesonide (PULMICORT) nebulizer solution  0.5 mg Nebulization BID  . clopidogrel  37.5 mg Oral Daily  . feeding supplement (GLUCERNA SHAKE)  237 mL Oral TID BM  . ferrous sulfate  325 mg Oral Q breakfast  . furosemide  40 mg Intravenous Q12H  . gabapentin  100 mg Oral QHS  . insulin aspart  0-5 Units Subcutaneous QHS  . insulin aspart  0-9 Units Subcutaneous TID WC  . insulin aspart  3 Units Subcutaneous TID WC  . insulin glargine  10 Units Subcutaneous BID  . ipratropium-albuterol  3 mL Nebulization Q6H  . magic mouthwash  5 mL Oral TID  . melatonin  2.5 mg Oral QHS  . [START ON 02/23/2020] methylPREDNISolone (SOLU-MEDROL) injection  40 mg Intravenous Q24H  . metoprolol tartrate  25 mg Oral BID  . multivitamin-lutein  1 capsule Oral BID  . rosuvastatin  5 mg Oral Once per day on Mon Wed Fri Sat  . sodium chloride flush  3 mL Intravenous Q12H   Continuous Infusions: . heparin 550 Units/hr (02/22/20 0112)   PRN Meds: acetaminophen, albuterol, clonazepam, dextromethorphan-guaiFENesin, hydrALAZINE, morphine injection, nitroGLYCERIN, ondansetron (ZOFRAN) IV   Vital Signs    Vitals:   02/22/20 0401 02/22/20 0500 02/22/20 0715 02/22/20 1115  BP: 137/65  (!) 144/61  126/73  Pulse: 83  83 81  Resp: 15  19 18   Temp: 97.7 F (36.5 C)  (!) 97.5 F (36.4 C) 97.6 F (36.4 C)  TempSrc:   Oral Oral  SpO2: 99%  100% 100%  Weight:  57.2 kg    Height:        Intake/Output Summary (Last 24 hours) at 02/22/2020 1336 Last data filed at 02/22/2020 1038 Gross per 24 hour  Intake 830.55 ml  Output 1700 ml  Net -869.45 ml   Last 3 Weights 02/22/2020 02/21/2020 02/20/2020  Weight (lbs) 126 lb 3.2 oz 127 lb 8 oz 125 lb 1.6 oz  Weight (kg) 57.244 kg 57.834 kg 56.745 kg      Telemetry     SR, 70-80s- Personally Reviewed  ECG    No new tracings - Personally Reviewed  Physical Exam   GEN: No acute distress.   Neck: No significant JVD Cardiac: RRR, 2/6  systolic murmur best appreciated at the base. No rubs, or gallops.  Respiratory: Clear to auscultation bilaterally. Bibasilar reduced breath sounds. Wheezing. GI: Soft, nontender, mildy distended  MS: No edema; No deformity. Neuro:  Nonfocal  Psych: Normal affect   Labs    High Sensitivity Troponin:   Recent Labs  Lab 02/18/20 1051 02/18/20 1431 02/19/20 1525 02/20/20 0917 02/20/20 1121  TROPONINIHS 193* 202* 3,082* 1,409* 1,327*      Chemistry Recent Labs  Lab 02/20/20 0411  02/21/20 0545 02/21/20 1417 02/22/20 0442  NA 127* 127*  --  129*  K 4.8 5.5*  --  4.7  CL 96* 96*  --  96*  CO2 17* 18*  --  20*  GLUCOSE 232* 237*  --  156*  BUN 77* 93*  --  108*  CREATININE 2.33* 2.72*  --  2.74*  CALCIUM 9.2 9.0  --  9.0  PROT  --   --  6.1*  --   ALBUMIN  --   --  3.5  --   AST  --   --  33  --   ALT  --   --  50*  --   ALKPHOS  --   --  78  --   BILITOT  --   --  0.7  --   GFRNONAA 19* 16*  --  15*  GFRAA 22* 18*  --  18*  ANIONGAP 14 13  --  13     Hematology Recent Labs  Lab 02/20/20 0917 02/21/20 0545 02/22/20 0442  WBC 25.2* 19.9* 15.5*  RBC 3.15* 3.24* 3.32*  HGB 9.3* 9.4* 9.8*  HCT 29.1* 29.7* 29.3*  MCV 92.4 91.7 88.3  MCH 29.5 29.0 29.5  MCHC 32.0 31.6 33.4  RDW  13.9 14.0 13.7  PLT 339 302 287    BNP Recent Labs  Lab 02/18/20 0517  BNP 600.0*     DDimer No results for input(s): DDIMER in the last 168 hours.   Radiology    US RENAL  Result Date: 02/21/2020 CLINICAL DATA:  Acute kidney injury EXAM: RENAL / URINARY TRACT ULTRASOUND COMPLETE COMPARISON:  None. FINDINGS: Right Kidney: Renal measurements: 9.1 x 4.1 x 4.7 cm = volume: 92 mL . Echogenicity is slightly increased. Renal cortical thickness is within normal limits. No mass, perinephric fluid, or hydronephrosis visualized. No sonographically demonstrable calculus or ureterectasis. Left Kidney: Renal measurements: 10.4 x 4.7 x 4.5 cm = volume: 112 mL. Echogenicity is slightly increased. Renal cortical thickness normal. No perinephric fluid or hydronephrosis visualized. There is a cyst in the upper pole left kidney measuring 3.3 x 2.9 x 2.5 cm. A second cyst in this area measures 2.5 x 1.8 x 1.4 cm. A cyst in the lower pole region on the left measures 1.6 x 1.3 x 1.2 cm. Bladder: Appears normal for degree of bladder distention. Other: Bilateral pleural effusions noted. IMPRESSION: 1. Renal echogenicity mildly increased bilaterally, a finding that may be indicative of a degree of medical renal disease. Renal cortical thickness within normal limits bilaterally. No obstructing focus in either kidney. 2.  There are cysts in the left kidney. 3.  Bilateral pleural effusions. Electronically Signed   By: Lowella Grip III M.D.   On: 02/21/2020 11:02   US Venous Img Lower Bilateral (DVT)  Result Date: 02/20/2020 CLINICAL DATA:  Bilateral lower extremity pain and edema. Hypoxia. Recent falls. Evaluate for DVT. EXAM: BILATERAL LOWER EXTREMITY VENOUS DOPPLER ULTRASOUND TECHNIQUE: Gray-scale sonography with graded compression, as well as color Doppler and duplex ultrasound were performed to evaluate the lower extremity deep venous systems from the level of the common femoral vein and including the common  femoral, femoral, profunda femoral, popliteal and calf veins including the posterior tibial, peroneal and gastrocnemius veins when visible. The superficial great saphenous vein was also interrogated. Spectral Doppler was utilized to evaluate flow at rest and with distal augmentation maneuvers in the common femoral, femoral and popliteal veins. COMPARISON:  None. FINDINGS: RIGHT LOWER EXTREMITY Common  Femoral Vein: No evidence of thrombus. Normal compressibility, respiratory phasicity and response to augmentation. Saphenofemoral Junction: No evidence of thrombus. Normal compressibility and flow on color Doppler imaging. Profunda Femoral Vein: No evidence of thrombus. Normal compressibility and flow on color Doppler imaging. Femoral Vein: No evidence of thrombus. Normal compressibility, respiratory phasicity and response to augmentation. Popliteal Vein: No evidence of thrombus. Normal compressibility, respiratory phasicity and response to augmentation. Calf Veins: No evidence of thrombus. Normal compressibility and flow on color Doppler imaging. Superficial Great Saphenous Vein: No evidence of thrombus. Normal compressibility. Venous Reflux:  None. Other Findings:  None. LEFT LOWER EXTREMITY Common Femoral Vein: No evidence of thrombus. Normal compressibility, respiratory phasicity and response to augmentation. Saphenofemoral Junction: No evidence of thrombus. Normal compressibility and flow on color Doppler imaging. Profunda Femoral Vein: No evidence of thrombus. Normal compressibility and flow on color Doppler imaging. Femoral Vein: No evidence of thrombus. Normal compressibility, respiratory phasicity and response to augmentation. Popliteal Vein: No evidence of thrombus. Normal compressibility, respiratory phasicity and response to augmentation. Calf Veins: No evidence of thrombus. Normal compressibility and flow on color Doppler imaging. Superficial Great Saphenous Vein: No evidence of thrombus. Normal  compressibility. Venous Reflux:  None. Other Findings:  None. IMPRESSION: No evidence of DVT within either lower extremity. Electronically Signed   By: Sandi Mariscal M.D.   On: 02/20/2020 16:26    Cardiac Studies   Echo 02/19/2020 1. Left ventricular ejection fraction, by estimation, is 55 to 60%. The  left ventricle has normal function. The left ventricle has no regional  wall motion abnormalities. There is mild left ventricular hypertrophy.  Left ventricular diastolic parameters  are consistent with Grade II diastolic dysfunction (pseudonormalization).  2. Right ventricular systolic function is normal. The right ventricular  size is normal. There is moderately elevated pulmonary artery systolic  pressure.  3. Left atrial size was mildly dilated.  4. The mitral valve is myxomatous. Moderate mitral valve regurgitation.  No evidence of mitral stenosis.  5. The aortic valve is normal in structure. Aortic valve regurgitation is  not visualized. Mild aortic valve sclerosis is present, with no evidence  of aortic valve stenosis.  6. The inferior vena cava is dilated in size with >50% respiratory  variability, suggesting right atrial pressure of 8 mmHg.   Patient Profile     85 y.o. female with a history of HFpEF, pulmonary HTN, moderate MR, PVD with bilateral carotid stenosis and previous LLE vascular surgery, HTN, DM2, history of CVA, CKD, prior h/o smoking, and seen today for acute HFpEF exacerbation.   Assessment & Plan    Respiratory failure HFpEF, Pulmonary HTN --Multifactorial etiology of SOB given anemia, leukocytosis, pulmonary HTN, MR, and long history of smoking.  --Given hypoxia with recent fall, PE is a consideration. Given renal function, could consider V/Q scan but unable to lay flat. CTA precluded by renal function. Venous duplex performed without significant findings.  --Most recent echo as above with EF nl and without significant evidence of right heart strain.  --Wt  127.5lbs  126.2lbs. I/Os +1034 for admission and -220cc in 24h. Not significantly volume overloaded on exam. --BiPAP per pulmonary, CCM. Continue [place] oxygen. --Nephrology consulted for worsening renal function with initial diuresis held for AKI then restarted 4/28 after b/l pleural effusions noted on renal ultrasound report with worsening Cr today and subsequent increase of IV lasix 40mg  q12h. Recommend continue to monitor renal function and volume status. I/O, daily weights.  --Would benefit from right heart cardiac catheterization for  further evaluation of hemodynamics and better understanding of volume status.   NSTEMI History of CAD --No CP. Previous cath in 1998 with 40% LM and 95% ramus. Would benefit from ischemic workup at some point.  --EKG without acute ST/T changes.  HS Tn peaked at 3082.  Echo without WMA or reduced EF. --Recommend further ischemic evaluation with R/LHC once renal function allows and able to lay flat. If renal function does not allow LHC, she would at least benefit from [place] once able to lay flat.  --She is s/p 48h heparin. On renally dosed DAPT. Considered is anemia. Previous (before admission) fall / report of melena with CBC showing anemia. Further workup of anemia per IM. Monitor for s/sx of bleeding.    Moderate MR --Continue to monitor with periodic echo.  Anemia History of GIB History of Melena ----Hgb 9.8 with HCT 29.3. Continue to monitor given report of melena and fall with timing unclear. Consider transfusion with Hgb below 8.5. She is on antiplatelet therapy, so will want to r/o GIB before discharge.  Recent fall Scalp contusion / Nodule --States this occurred last Thursday and right before her onset of SOB. Current scalp nodule / contusion on top left of head. Timing of fall unclear as imaging was performed 3/13, unless this is a repeat fall. Consider repeat CT of head.  As previously noted, also considered is further workup of anemia.  HTN --BP  controlled. Titrate antihypertensives as needed. Reports that sometimes her BP elevates at home to SBP 190s. ARB held 2/2 renal function by nephrology as below.   PVD --F/u per vascular surgery. H/o L femoral endarterectomy and SFA stenting. Continue statin and ASA.   HLD --Continue statin with LDL <70.   CKD IIIA --Current Cr 1.85  2.74 with BUN 57  108. Baseline Cr 1.1. Nephrology consulted with notes indicating that CKD likely 2/2 HTN and DM2 and ARF 2/2 cardiorenal syndrome. Renal ultrasound without obstruction. Recommend against contrast at this time. Per nephrology, no current indication for HD. Holding Losartan. Caution with abx /diuresis / medications that are renally excreted.   Hyponatremia --Monitor closely with daily BMET.   For questions or updates, please contact [place] Please consult [email or web address] for contact info under        Signed, Arvil Chaco, PA-C  02/22/2020, 1:36 PM

## 2020-02-23 ENCOUNTER — Encounter
Admission: EM | Disposition: A | Discharge status: Home Health | Payer: Self-pay | Source: Home / Self Care | Attending: Internal Medicine

## 2020-02-23 ENCOUNTER — Encounter: Payer: Self-pay | Admitting: Cardiology

## 2020-02-23 DIAGNOSIS — D649 Anemia, unspecified: Secondary | ICD-10-CM

## 2020-02-23 HISTORY — PX: RIGHT HEART CATH: CATH118263

## 2020-02-23 LAB — CBC
HCT: 27.7 % — ABNORMAL LOW (ref 36.0–46.0)
Hemoglobin: 9.6 g/dL — ABNORMAL LOW (ref 12.0–15.0)
MCH: 30.6 pg (ref 26.0–34.0)
MCHC: 34.7 g/dL (ref 30.0–36.0)
MCV: 88.2 fL (ref 80.0–100.0)
Platelets: 289 10*3/uL (ref 150–400)
RBC: 3.14 MIL/uL — ABNORMAL LOW (ref 3.87–5.11)
RDW: 13.7 % (ref 11.5–15.5)
WBC: 16.4 10*3/uL — ABNORMAL HIGH (ref 4.0–10.5)
nRBC: 0.5 % — ABNORMAL HIGH (ref 0.0–0.2)

## 2020-02-23 LAB — CULTURE, BLOOD (ROUTINE X 2)
Culture: NO GROWTH
Culture: NO GROWTH
Special Requests: ADEQUATE

## 2020-02-23 LAB — BASIC METABOLIC PANEL
Anion gap: 14 (ref 5–15)
BUN: 116 mg/dL — ABNORMAL HIGH (ref 8–23)
CO2: 21 mmol/L — ABNORMAL LOW (ref 22–32)
Calcium: 8.6 mg/dL — ABNORMAL LOW (ref 8.9–10.3)
Chloride: 97 mmol/L — ABNORMAL LOW (ref 98–111)
Creatinine, Ser: 2.88 mg/dL — ABNORMAL HIGH (ref 0.44–1.00)
GFR calc Af Amer: 17 mL/min — ABNORMAL LOW (ref >60)
GFR calc non Af Amer: 14 mL/min — ABNORMAL LOW (ref >60)
Glucose, Bld: 214 mg/dL — ABNORMAL HIGH (ref 70–99)
Potassium: 4.7 mmol/L (ref 3.5–5.1)
Sodium: 132 mmol/L — ABNORMAL LOW (ref 135–145)

## 2020-02-23 LAB — GLUCOSE, CAPILLARY
Glucose-Capillary: 214 mg/dL — ABNORMAL HIGH (ref 70–99)
Glucose-Capillary: 323 mg/dL — ABNORMAL HIGH (ref 70–99)
Glucose-Capillary: 270 mg/dL — ABNORMAL HIGH (ref 70–99)
Glucose-Capillary: 113 mg/dL — ABNORMAL HIGH (ref 70–99)
Glucose-Capillary: 309 mg/dL — ABNORMAL HIGH (ref 70–99)

## 2020-02-23 LAB — HEPARIN LEVEL (UNFRACTIONATED): Heparin Unfractionated: 0.36 IU/mL (ref 0.30–0.70)

## 2020-02-23 SURGERY — RIGHT HEART CATH
Anesthesia: Moderate Sedation

## 2020-02-23 MED ORDER — HEPARIN (PORCINE) IN NACL 1000-0.9 UT/500ML-% IV SOLN
INTRAVENOUS | Status: AC
  Filled 2020-02-23: qty 1000

## 2020-02-23 MED ORDER — FENTANYL CITRATE (PF) 100 MCG/2ML IJ SOLN
INTRAMUSCULAR | Status: AC
  Filled 2020-02-23: qty 2

## 2020-02-23 MED ORDER — HEPARIN (PORCINE) IN NACL 1000-0.9 UT/500ML-% IV SOLN
INTRAVENOUS | Status: DC | PRN
  Administered 2020-02-23: 500 mL

## 2020-02-23 MED ORDER — INSULIN GLARGINE 100 UNIT/ML ~~LOC~~ SOLN
15.0000 [IU] | Freq: Every day | SUBCUTANEOUS | Status: DC
  Administered 2020-02-24: 09:00:00 15 [IU] via SUBCUTANEOUS
  Filled 2020-02-23: qty 0.15

## 2020-02-23 MED ORDER — TORSEMIDE 20 MG PO TABS
40.0000 mg | ORAL_TABLET | Freq: Every day | ORAL | Status: DC
  Administered 2020-02-24 – 2020-02-26 (×3): 40 mg via ORAL
  Filled 2020-02-23 (×3): qty 2

## 2020-02-23 MED ORDER — ASPIRIN 81 MG PO CHEW
CHEWABLE_TABLET | ORAL | Status: AC
  Filled 2020-02-23: qty 1

## 2020-02-23 MED ORDER — FENTANYL CITRATE (PF) 100 MCG/2ML IJ SOLN
INTRAMUSCULAR | Status: DC | PRN
  Administered 2020-02-23: 25 ug via INTRAVENOUS

## 2020-02-23 MED ORDER — HEPARIN SODIUM (PORCINE) 5000 UNIT/ML IJ SOLN
5000.0000 [IU] | Freq: Two times a day (BID) | INTRAMUSCULAR | Status: DC
  Administered 2020-02-24 – 2020-02-25 (×2): 5000 [IU] via SUBCUTANEOUS
  Filled 2020-02-23 (×2): qty 1

## 2020-02-23 MED ORDER — MIDAZOLAM HCL 2 MG/2ML IJ SOLN
INTRAMUSCULAR | Status: DC | PRN
  Administered 2020-02-23: 0.5 mg via INTRAVENOUS

## 2020-02-23 MED ORDER — SODIUM CHLORIDE 0.9 % IV SOLN: INTRAVENOUS | Status: DC

## 2020-02-23 MED ORDER — MIDAZOLAM HCL 2 MG/2ML IJ SOLN
INTRAMUSCULAR | Status: AC
  Filled 2020-02-23: qty 2

## 2020-02-23 MED ORDER — ENOXAPARIN SODIUM 30 MG/0.3ML ~~LOC~~ SOLN
30.0000 mg | SUBCUTANEOUS | Status: DC
  Administered 2020-02-23: 09:00:00 30 mg via SUBCUTANEOUS
  Filled 2020-02-23: qty 0.3

## 2020-02-23 MED ORDER — FUROSEMIDE 10 MG/ML IJ SOLN
40.0000 mg | Freq: Two times a day (BID) | INTRAMUSCULAR | Status: AC
  Administered 2020-02-23: 40 mg via INTRAVENOUS
  Filled 2020-02-23: qty 4

## 2020-02-23 MED ORDER — ASPIRIN 81 MG PO CHEW
81.0000 mg | CHEWABLE_TABLET | ORAL | Status: AC
  Administered 2020-02-23: 81 mg via ORAL

## 2020-02-23 SURGICAL SUPPLY — 5 items
CATH SWANZ 7F THERMO (CATHETERS) ×2 IMPLANT
KIT MANI 3VAL PERCEP (MISCELLANEOUS) ×2 IMPLANT
NEEDLE PERC 18GX7CM (NEEDLE) ×2 IMPLANT
PACK CARDIAC CATH (CUSTOM PROCEDURE TRAY) ×2 IMPLANT
SHEATH AVANTI 7FRX11 (SHEATH) ×2 IMPLANT

## 2020-02-23 NOTE — Consult Note (Signed)
[place] for Heparin Indication: chest pain/ACS  Allergies  Allergen Reactions  . Saxagliptin Diarrhea  . Epinephrine Other (See Comments)    Patient does not remember what happens when she uses this  . Atorvastatin Other (See Comments)    Muscle aches  . Codeine Other (See Comments)    Upset stomach  . Ezetimibe Other (See Comments)    Myalgias(ZETIA)  . Limonene Rash    Patient does not recall this reaction  . Nitrofurantoin Rash and Other (See Comments)    Pruitus  . Sulfa Antibiotics Rash and Other (See Comments)    Sore mouth     Patient Measurements: Height: 5' (152.4 cm) Weight: 56.7 kg (125 lb 1.6 oz) IBW/kg (Calculated) : 45.5 Heparin Dosing Weight: 56.7 kg  Vital Signs: Temp: 97.9 F (36.6 C) (04/30 0451) Temp Source: Oral (04/30 0451) BP: 125/54 (04/30 0451) Pulse Rate: 72 (04/30 0451)  Labs: Recent Labs     0000 02/20/20 0917 02/20/20 0932 02/20/20 1121 02/20/20 2001 02/21/20 0545 02/21/20 0545 02/21/20 1417 02/22/20 0442 02/23/20 0621  HGB   < > 9.3*  --   --   --  9.4*   < >  --  9.8* 9.6*  HCT   < > 29.1*  --   --   --  29.7*  --   --  29.3* 27.7*  PLT   < > 339  --   --   --  302  --   --  287 289  APTT  --   --  31  --   --   --   --   --   --   --   LABPROT  --   --  15.4*  --   --   --   --   --   --   --   INR  --   --  1.2  --   --   --   --   --   --   --   HEPARINUNFRC  --   --   --   --    < > 0.55   < > 0.40 0.31 0.36  CREATININE  --   --   --   --   --  2.72*  --   --  2.74*  --   TROPONINIHS  --  1,409*  --  1,327*  --   --   --   --   --   --    < > = values in this interval not displayed.    Estimated Creatinine Clearance: 12.3 mL/min (A) (by C-G formula based on SCr of 2.74 mg/dL (H)).   Medical History: Past Medical History:  Diagnosis Date  . Allergies   . Anxiety   . Arthritis    spine and shoulder  . Atherosclerosis of artery of extremity with rest pain ([place]) 10/12/2018  .  Cancer ([place])    skin  . Diabetes mellitus without complication ([place])   . Heart murmur   . Hyperlipidemia   . Hypertension   . Macula lutea degeneration   . Mitral and aortic valve disease   . Myocardial infarction [facility])    may have had a "light" heart attack  . Occasional tremors   . PAD (peripheral artery disease) ([place])   . Shingles    patient unaware but daughter confirms. it was a long time ago  . Stroke [facility]) 01/2017  may have had a slight stroke  . TIA (transient ischemic attack) 01/2017  . UTI (urinary tract infection)   . Vascular disease, peripheral ([place])     Medications:  Medications Prior to Admission  Medication Sig Dispense Refill Last Dose  . acetaminophen (TYLENOL) 650 MG CR tablet Take 1,300 mg by mouth every 8 (eight) hours as needed for pain.   prn at prn  . baclofen (LIORESAL) 10 MG tablet TAKE 1 TABLET(10 MG) BY MOUTH THREE TIMES DAILY (Patient taking differently: Take 10 mg by mouth daily as needed. ) 270 tablet 0 prn at prn  . chlorthalidone (HYGROTON) 25 MG tablet Take 25 mg by mouth daily.    Unknown at Unknown  . clopidogrel (PLAVIX) 75 MG tablet TAKE 1 TABLET BY MOUTH EVERY DAY (Patient taking differently: Take 37.5 mg by mouth daily. ) 30 tablet 1 Unknown at Unknown  . Cyanocobalamin (RA VITAMIN B-12 TR) 1000 MCG TBCR Take 1,000 mcg by mouth daily.    Unknown at Unknown  . diltiazem (CARTIA XT) 180 MG 24 hr capsule Take 180 mg by mouth 2 (two) times daily.    Unknown at Unknown  . ferrous sulfate 325 (65 FE) MG tablet Take 325 mg by mouth daily with breakfast.   Unknown at Unknown  . gabapentin (NEURONTIN) 100 MG capsule TAKE 1 TO 2 CAPSULES(100 TO 200 MG) BY MOUTH AT BEDTIME (Patient taking differently: Take 100-200 mg by mouth at bedtime. ) 180 capsule 6 Unknown at Unknown  . insulin aspart (NOVOLOG FLEXPEN) 100 UNIT/ML FlexPen Inject 3 Units into the skin 3 (three) times daily with meals. At lunch (Patient taking differently: Inject 2 Units into the skin  daily. At breakfast) 15 mL 3 Unknown at Unknown  . Insulin Glargine (BASAGLAR KWIKPEN) 100 UNIT/ML SOPN Inject 0.12 mLs (12 Units total) into the skin daily. 15 mL 3 Unknown at Unknown  . losartan (COZAAR) 50 MG tablet Take 50 mg by mouth daily.    Unknown at Unknown  . metFORMIN (GLUCOPHAGE-XR) 500 MG 24 hr tablet TAKE 1 TABLET BY MOUTH TWICE DAILY 60 tablet 11 Unknown at Unknown  . Multiple Vitamins-Minerals (PRESERVISION AREDS PO) Take 1 capsule by mouth 2 (two) times daily.    Unknown at Unknown  . nitroGLYCERIN (NITROSTAT) 0.4 MG SL tablet 1 tab under the tongue for CP, may repeat in 5 minutes up to 3 doses 25 tablet 5 prn at prn  . rosuvastatin (CRESTOR) 5 MG tablet Take 5 mg by mouth every other day. Mon, Wed, Fri and Sat.   Unknown at Unknown  . B-D ULTRAFINE III SHORT PEN 31G X 8 MM MISC USE AS DIRECTED 100 each 3   . feeding supplement, GLUCERNA SHAKE, (GLUCERNA SHAKE) LIQD Take 237 mLs by mouth 3 (three) times daily between meals. 21330 mL 0   . pantoprazole (PROTONIX) 40 MG tablet Take 1 tablet (40 mg total) by mouth daily. (Patient not taking: Reported on 02/18/2020) 30 tablet 0 Not Taking at Unknown   Scheduled:  . azithromycin  250 mg Oral Daily  . budesonide (PULMICORT) nebulizer solution  0.5 mg Nebulization BID  . clopidogrel  37.5 mg Oral Daily  . feeding supplement (GLUCERNA SHAKE)  237 mL Oral TID BM  . ferrous sulfate  325 mg Oral Q breakfast  . furosemide  40 mg Intravenous Q12H  . gabapentin  100 mg Oral QHS  . insulin aspart  0-5 Units Subcutaneous QHS  . insulin aspart  0-9 Units Subcutaneous TID  WC  . insulin aspart  3 Units Subcutaneous TID WC  . insulin glargine  10 Units Subcutaneous Daily  . ipratropium-albuterol  3 mL Nebulization Q6H  . magic mouthwash  5 mL Oral TID  . melatonin  2.5 mg Oral QHS  . methylPREDNISolone (SOLU-MEDROL) injection  40 mg Intravenous Q24H  . metoprolol tartrate  25 mg Oral BID  . multivitamin-lutein  1 capsule Oral BID  .  rosuvastatin  5 mg Oral Once per day on Mon Wed Fri Sat  . sodium chloride flush  3 mL Intravenous Q12H   Infusions:  . heparin 550 Units/hr (02/22/20 0112)   PRN: acetaminophen, albuterol, clonazepam, dextromethorphan-guaiFENesin, hydrALAZINE, morphine injection, nitroGLYCERIN, ondansetron (ZOFRAN) IV Anti-infectives (From admission, onward)   Start     Dose/Rate Route Frequency Ordered Stop   02/21/20 1800  azithromycin (ZITHROMAX) tablet 250 mg     250 mg Oral Daily 02/20/20 1056 02/25/20 0959   02/20/20 1800  azithromycin (ZITHROMAX) tablet 500 mg     500 mg Oral Daily-1800 02/20/20 1056 02/20/20 1851   02/19/20 1000  doxycycline (VIBRAMYCIN) 100 mg in sodium chloride 0.9 % 250 mL IVPB  Status:  Discontinued     100 mg 125 mL/hr over 120 Minutes Intravenous Every 12 hours 02/19/20 0818 02/20/20 1032   02/18/20 0630  cefTRIAXone (ROCEPHIN) 1 g in sodium chloride 0.9 % 100 mL IVPB     1 g 200 mL/hr over 30 Minutes Intravenous  Once 02/18/20 0616 02/18/20 0809   02/18/20 0630  azithromycin (ZITHROMAX) 500 mg in sodium chloride 0.9 % 250 mL IVPB     500 mg 250 mL/hr over 60 Minutes Intravenous  Once 02/18/20 0616 02/18/20 0810      Assessment: Pharmacy consulted to start heparin for ACS. NO DOAC PTA noted. Baseline labs ordered.   4/28 0545 HL 0.55  4/28 1417 HL 0.4  4/29 0442 HL 0.31, therapeutic 4/30 0621 HL 0.36, therapeutic  Goal of Therapy:  Heparin level 0.3-0.7 units/ml Monitor platelets by anticoagulation protocol: Yes   Plan:  Heparin level is therapeutic.  CBC stable.  Will continue heparin at current rate and recheck HL and CBC with AM labs.  Ena Dawley, PharmD 02/23/2020,7:33 AM

## 2020-02-23 NOTE — Progress Notes (Signed)
Progress Note  Patient Name: Jill Hudson Date of Encounter: 02/23/2020  Primary Cardiologist: [facility]  Subjective   Dyspnea is much improved. Did not require BiPAP overnight. Remains on supplemental oxygen via nasal cannula at 4 L. No chest pain or palpitations. Documented UOP of 2 L for the past 24 hours with a net - 800 mL for the admission. Weight 57.2-->56.7 kg. Renal function has been worsening over the past several days and is pending this morning.   Inpatient Medications    Scheduled Meds: . azithromycin  250 mg Oral Daily  . budesonide (PULMICORT) nebulizer solution  0.5 mg Nebulization BID  . clopidogrel  37.5 mg Oral Daily  . feeding supplement (GLUCERNA SHAKE)  237 mL Oral TID BM  . ferrous sulfate  325 mg Oral Q breakfast  . furosemide  40 mg Intravenous Q12H  . gabapentin  100 mg Oral QHS  . insulin aspart  0-5 Units Subcutaneous QHS  . insulin aspart  0-9 Units Subcutaneous TID WC  . insulin aspart  3 Units Subcutaneous TID WC  . insulin glargine  10 Units Subcutaneous Daily  . ipratropium-albuterol  3 mL Nebulization Q6H  . magic mouthwash  5 mL Oral TID  . melatonin  2.5 mg Oral QHS  . methylPREDNISolone (SOLU-MEDROL) injection  40 mg Intravenous Q24H  . metoprolol tartrate  25 mg Oral BID  . multivitamin-lutein  1 capsule Oral BID  . rosuvastatin  5 mg Oral Once per day on Mon Wed Fri Sat  . sodium chloride flush  3 mL Intravenous Q12H   Continuous Infusions: . heparin 550 Units/hr (02/22/20 0112)   PRN Meds: acetaminophen, albuterol, clonazepam, dextromethorphan-guaiFENesin, hydrALAZINE, morphine injection, nitroGLYCERIN, ondansetron (ZOFRAN) IV   Vital Signs    Vitals:   02/22/20 1957 02/22/20 2051 02/23/20 0451 02/23/20 0757  BP:  (!) 129/49 (!) 125/54 (!) 142/51  Pulse:  87 72 79  Resp:  20 20 18   Temp:  97.6 F (36.4 C) 97.9 F (36.6 C) 98.2 F (36.8 C)  TempSrc:  Oral Oral   SpO2: 100% 100% 99% 100%  Weight:   56.7 kg   Height:         Intake/Output Summary (Last 24 hours) at 02/23/2020 0806 Last data filed at 02/23/2020 0453 Gross per 24 hour  Intake 490 ml  Output 2550 ml  Net -2060 ml   Filed Weights   02/21/20 0401 02/22/20 0500 02/23/20 0451  Weight: 57.8 kg 57.2 kg 56.7 kg    Telemetry    SR - Personally Reviewed  ECG    No new tracings - Personally Reviewed  Physical Exam   GEN: No acute distress.   Neck: JVD ~ 8 cm. Cardiac: RRR, no murmurs, rubs, or gallops.  Respiratory: Clear to auscultation bilaterally.  GI: Soft, nontender, non-distended.   MS: No edema; No deformity. Neuro:  Alert and oriented x 3; Nonfocal.  Psych: Normal affect.  Labs    Chemistry Recent Labs  Lab 02/20/20 0411 02/21/20 0545 02/21/20 1417 02/22/20 0442  NA 127* 127*  --  129*  K 4.8 5.5*  --  4.7  CL 96* 96*  --  96*  CO2 17* 18*  --  20*  GLUCOSE 232* 237*  --  156*  BUN 77* 93*  --  108*  CREATININE 2.33* 2.72*  --  2.74*  CALCIUM 9.2 9.0  --  9.0  PROT  --   --  6.1*  --   ALBUMIN  --   --  3.5  --   AST  --   --  33  --   ALT  --   --  50*  --   ALKPHOS  --   --  78  --   BILITOT  --   --  0.7  --   GFRNONAA 19* 16*  --  15*  GFRAA 22* 18*  --  18*  ANIONGAP 14 13  --  13     Hematology Recent Labs  Lab 02/21/20 0545 02/22/20 0442 02/23/20 0621  WBC 19.9* 15.5* 16.4*  RBC 3.24* 3.32* 3.14*  HGB 9.4* 9.8* 9.6*  HCT 29.7* 29.3* 27.7*  MCV 91.7 88.3 88.2  MCH 29.0 29.5 30.6  MCHC 31.6 33.4 34.7  RDW 14.0 13.7 13.7  PLT 302 287 289    Cardiac EnzymesNo results for input(s): TROPONINI in the last 168 hours. No results for input(s): TROPIPOC in the last 168 hours.   BNP Recent Labs  Lab 02/18/20 0517  BNP 600.0*     DDimer No results for input(s): DDIMER in the last 168 hours.   Radiology    US RENAL  Result Date: 02/21/2020 IMPRESSION: 1. Renal echogenicity mildly increased bilaterally, a finding that may be indicative of a degree of medical renal disease. Renal cortical  thickness within normal limits bilaterally. No obstructing focus in either kidney. 2.  There are cysts in the left kidney. 3.  Bilateral pleural effusions. Electronically Signed   By: Lowella Grip III M.D.   On: 02/21/2020 11:02    Cardiac Studies   2D echo 02/19/2020: 1. Left ventricular ejection fraction, by estimation, is 55 to 60%. The  left ventricle has normal function. The left ventricle has no regional  wall motion abnormalities. There is mild left ventricular hypertrophy.  Left ventricular diastolic parameters  are consistent with Grade II diastolic dysfunction (pseudonormalization).  2. Right ventricular systolic function is normal. The right ventricular  size is normal. There is moderately elevated pulmonary artery systolic  pressure.  3. Left atrial size was mildly dilated.  4. The mitral valve is myxomatous. Moderate mitral valve regurgitation.  No evidence of mitral stenosis.  5. The aortic valve is normal in structure. Aortic valve regurgitation is  not visualized. Mild aortic valve sclerosis is present, with no evidence  of aortic valve stenosis.  6. The inferior vena cava is dilated in size with >50% respiratory  variability, suggesting right atrial pressure of 8 mmHg.   Patient Profile     85 y.o. female with history of CAD, GI bleed with anemia, CKD stage III, CVA, DM2, HTN, HLD admitted with acute hypoxic respiratory distress with NSTEMI, acute on chronic HFpEF/pulmonary hypertension  Assessment & Plan    1. CAD with NSTEMI: -Prior LHC in 1998 with 40% left main and 95% ramus stenoses  -She has completed > 48 hours of heparin gtt, will discontinue -Plavix  -Ideally, she would undergo a LHC, though this is unable to be performed at this time with worsening renal function, continue to monitor and assess for timing of this  2. Acute hypoxic respiratory distress: -Multifactorial including acute on chronic HFpEF, pulmonary hypertension, AECOPD and  anemia -She remains on supplemental oxygen via nasal cannula at 4 L this morning -Would benefit from RHC as respiratory status improves  3. HFpEF/pulmonary hypertension: -Volume status is improving -She remains on IV Lasix 40 mg bid -Await renal function this morning for further recommendations regarding diuresis   4. Acute on CKD stage III: -Renal  function has continued to decline over the past several days with a BUN/SCr of 108/2.74 on 4/29 with baseline around 1 to 1.2 -Renal ultrasound without obstruction   5. History of GI bleed with anemia: -HGB low, though stable  6. HTN: -Blood pressure is reasonably controlled -Lasix -Metoprolol   7. HLD: -LDL 54 this admission  -Crestor   8. Hyponatremia: -BMP pending -Free water restriction    For questions or updates, please contact [place] Please consult [email or web address] for contact info under Cardiology/STEMI.    Signed, Christell Faith, PA-C [place] Pager: [phone] 02/23/2020, 8:06 AM

## 2020-02-23 NOTE — Progress Notes (Signed)
Physical Therapy Treatment Patient Details Name: JULLIE ARPS MRN: [id number] DOB: October 06, 1935 Today's Date: 02/23/2020    History of Present Illness SELEN SMUCKER is a 85 y.o. female with medical history significant of Hypertension, hyperlipidemia, diabetes mellitus, stroke, TIA, GERD, anxiety, PVD, CAD, myocardial infarction, former smoker, CKD stage III, GI bleeding, iron deficiency anemia, who presented to the ED with shortness of breath. Breathing ad been progressively worsening.  Per EMS, patient pt woke up with gasping for air, and rescue found pt's oxygen saturation at 79% on RA. Pt was given 125 mg of solumedrol and 1.5 duonebs, pt's oxygen saturation improved to 98% on NSR. At arrival to ED, pt still has acute respiratory distress.  BiPAP was started.  Patient states that she has dry cough, no fever or chills.  She had some chest discomfort earlier, which has resolved. Patient states that she has mild intermittent loose stool bowel movement recently, but currently no nausea, vomiting, diarrhea or abdominal pain.  No symptoms of UTI or unilateral weakness. She was found to have BNP 600, troponin 67 -->143, WBC 17.2, negative Covid PCR, lactic acid 2.5, sodium 128, renal function close to baseline, temperature 97.7, blood pressure 165/56, heart rate 76, RR 27, oxygen saturation 99% on BiPAP.  Chest x-ray showed interstitial pulmonary edema and obesity bilateral basilar opacity.  Patient is now admitted to stepdown as inpatient for acute respiratory failure secondary to COPD exacerbation, CHF with elevated troponin likely due to demand ischemia, and hypotension. Per cardiology note pt also with recent fall and scalp contusion.     PT Comments    Pt reported that her breathing is a bit better today does endorse some mild R shoulder soreness, RN notified of pt request for tylenol. Pt initially eager to return to bed to rest, but bed soaked with urine, agreeable to up to chair after ambulating. The  patient demonstrated bed mobility and transfers mod I, able to ambulated ~42ft with RW and supervision/CGA. The patient did experience one LOB at doorway, able to correct without assist from PT, reported chronic L leg weakness. Pt up in chair, nursing tech in room in prep for patient bath. The patient would benefit from further skilled PT intervention to continue to progress towards goals. Recommendation remains appropriate.    Follow Up Recommendations  Home health PT     Equipment Recommendations  None recommended by PT;Other (comment)    Recommendations for Other Services       Precautions / Restrictions Precautions Precautions: Fall Restrictions Weight Bearing Restrictions: No    Mobility  Bed Mobility Overal bed mobility: Modified Independent                Transfers Overall transfer level: Modified independent Equipment used: Rolling walker (2 wheeled) Transfers: Sit to/from Stand Sit to Stand: Modified independent (Device/Increase time)         General transfer comment: Pt requires additional time to come to standing. She is mildly unsteady but is able to stabilize herself without external assist.   Ambulation/Gait Ambulation/Gait assistance: Min guard Gait Distance (Feet): 70 Feet Assistive device: Rolling walker (2 wheeled) Gait Pattern/deviations: Step-through pattern     General Gait Details: Pt did have one LOB at doorway, able to self correct with RW, reported it was due to her L leg, chronic issues   Stairs             Wheelchair Mobility    Modified Rankin (Stroke Patients Only)  Balance Overall balance assessment: Needs assistance Sitting-balance support: Feet supported Sitting balance-Leahy Scale: Good     Standing balance support: Bilateral upper extremity supported;During functional activity Standing balance-Leahy Scale: Fair Standing balance comment: Pt with improved safety with RW                             Cognition                                              Exercises      General Comments        Pertinent Vitals/Pain Pain Assessment: Faces Faces Pain Scale: Hurts a little bit Pain Location: R shoulder pain Pain Descriptors / Indicators: Grimacing Pain Intervention(s): Limited activity within patient's tolerance;Repositioned    Home Living                      Prior Function            PT Goals (current goals can now be found in the care plan section) Progress towards PT goals: Progressing toward goals    Frequency    Min 2X/week      PT Plan Current plan remains appropriate    Co-evaluation              AM-PAC PT "6 Clicks" Mobility   Outcome Measure  Help needed turning from your back to your side while in a flat bed without using bedrails?: None Help needed moving from lying on your back to sitting on the side of a flat bed without using bedrails?: None Help needed moving to and from a bed to a chair (including a wheelchair)?: A Little Help needed standing up from a chair using your arms (e.g., wheelchair or bedside chair)?: None Help needed to walk in hospital room?: A Little Help needed climbing 3-5 steps with a railing? : A Little 6 Click Score: 21    End of Session Equipment Utilized During Treatment: Gait belt;Oxygen Activity Tolerance: Patient tolerated treatment well Patient left: in chair;with call bell/phone within reach Nurse Communication: Mobility status PT Visit Diagnosis: Other abnormalities of gait and mobility (R26.89);Muscle weakness (generalized) (M62.81);Difficulty in walking, not elsewhere classified (R26.2)     Time: 5072-2575 PT Time Calculation (min) (ACUTE ONLY): 23 min  Charges:  $Therapeutic Exercise: 23-37 mins                     Lieutenant Diego PT, DPT 10:45 AM,02/23/20

## 2020-02-23 NOTE — Progress Notes (Signed)
[facility]  ROUNDING NOTE   Subjective:   Patient states she is breathing better.  UOP 2550 - furosemide 40mg  IV q12.  Right heart catheterization with normal right heart pressures.   Objective:  Vital signs in last 24 hours:  Temp:  [97.6 F (36.4 C)-98.4 F (36.9 C)] 98.4 F (36.9 C) (04/30 1520) Pulse Rate:  [71-87] 79 (04/30 1520) Resp:  [12-20] 18 (04/30 1520) BP: (125-143)/(49-60) 138/55 (04/30 1520) SpO2:  [97 %-100 %] 98 % (04/30 1520) Weight:  [56.7 kg] 56.7 kg (04/30 1302)  Weight change: -0.499 kg Filed Weights   02/22/20 0500 02/23/20 0451 02/23/20 1302  Weight: 57.2 kg 56.7 kg 56.7 kg    Intake/Output: I/O last 3 completed shifts: In: 1127.2 [P.O.:840; I.V.:287.2] Out: 3200 [Urine:3200]   Intake/Output this shift:  Total I/O In: 28.5 [I.V.:28.5] Out: 1550 [Urine:1550]  Physical Exam: General: NAD,   Head: +thrush  Eyes: Anicteric, PERRL  Neck: Supple, trachea midline  Lungs:  Bilateral crackles at bases  Heart: Regular rate and rhythm  Abdomen:  Soft, nontender,   Extremities:  trace peripheral edema.  Neurologic: Nonfocal, moving all four extremities  Skin: No lesions        Basic Metabolic Panel: Recent Labs  Lab 02/19/20 0506 02/19/20 0506 02/20/20 0411 02/20/20 0411 02/21/20 0545 02/22/20 0442 02/23/20 0621  NA 129*  --  127*  --  127* 129* 132*  K 5.1  --  4.8  --  5.5* 4.7 4.7  CL 98  --  96*  --  96* 96* 97*  CO2 20*  --  17*  --  18* 20* 21*  GLUCOSE 206*  --  232*  --  237* 156* 214*  BUN 57*  --  77*  --  93* 108* 116*  CREATININE 1.85*  --  2.33*  --  2.72* 2.74* 2.88*  CALCIUM 9.0   < > 9.2   < > 9.0 9.0 8.6*  MG 2.1  --   --   --   --   --   --    < > = values in this interval not displayed.    Liver Function Tests: Recent Labs  Lab 02/21/20 1417  AST 33  ALT 50*  ALKPHOS 78  BILITOT 0.7  PROT 6.1*  ALBUMIN 3.5   No results for input(s): LIPASE, AMYLASE in the last 168 hours. No results for  input(s): AMMONIA in the last 168 hours.  CBC: Recent Labs  Lab 02/18/20 0517 02/18/20 0517 02/19/20 0506 02/20/20 0917 02/21/20 0545 02/22/20 0442 02/23/20 0621  WBC 17.2*   < > 15.3* 25.2* 19.9* 15.5* 16.4*  NEUTROABS 13.3*  --   --  22.5* 16.9* 13.3*  --   HGB 10.2*   < > 9.3* 9.3* 9.4* 9.8* 9.6*  HCT 31.7*   < > 28.0* 29.1* 29.7* 29.3* 27.7*  MCV 91.4   < > 90.0 92.4 91.7 88.3 88.2  PLT 299   < > 268 339 302 287 289   < > = values in this interval not displayed.    Cardiac Enzymes: No results for input(s): CKTOTAL, CKMB, CKMBINDEX, TROPONINI in the last 168 hours.  BNP: Invalid input(s): POCBNP  CBG: Recent Labs  Lab 02/22/20 2124 02/23/20 0759 02/23/20 1151 02/23/20 1315 02/23/20 1620  GLUCAP 206* 214* 323* 270* 113*    Microbiology: Results for orders placed or performed during the hospital encounter of 02/18/20  Respiratory Panel by RT PCR (Flu A&B, Covid) -  Nasopharyngeal Swab     Status: None   Collection Time: 02/18/20  5:17 AM   Specimen: Nasopharyngeal Swab  Result Value Ref Range Status   SARS Coronavirus 2 by RT PCR NEGATIVE NEGATIVE Final    Comment: (NOTE) SARS-CoV-2 target nucleic acids are NOT DETECTED. The SARS-CoV-2 RNA is generally detectable in upper respiratoy specimens during the acute phase of infection. The lowest concentration of SARS-CoV-2 viral copies this assay can detect is 131 copies/mL. A negative result does not preclude SARS-Cov-2 infection and should not be used as the sole basis for treatment or other patient management decisions. A negative result may occur with  improper specimen collection/handling, submission of specimen other than nasopharyngeal swab, presence of viral mutation(s) within the areas targeted by this assay, and inadequate number of viral copies (<131 copies/mL). A negative result must be combined with clinical observations, patient history, and epidemiological information. The expected result is  Negative. Fact Sheet for Patients:  PinkCheek.be Fact Sheet for Healthcare Providers:  GravelBags.it This test is not yet ap proved or cleared by the [place] FDA and  has been authorized for detection and/or diagnosis of SARS-CoV-2 by FDA under an Emergency Use Authorization (EUA). This EUA will remain  in effect (meaning this test can be used) for the duration of the COVID-19 declaration under Section 564(b)(1) of the Act, 21 U.S.C. section 360bbb-3(b)(1), unless the authorization is terminated or revoked sooner.    Influenza A by PCR NEGATIVE NEGATIVE Final   Influenza B by PCR NEGATIVE NEGATIVE Final    Comment: (NOTE) The Xpert Xpress SARS-CoV-2/FLU/RSV assay is intended as an aid in  the diagnosis of influenza from Nasopharyngeal swab specimens and  should not be used as a sole basis for treatment. Nasal washings and  aspirates are unacceptable for Xpert Xpress SARS-CoV-2/FLU/RSV  testing. Fact Sheet for Patients: PinkCheek.be Fact Sheet for Healthcare Providers: GravelBags.it This test is not yet approved or cleared by the [place] FDA and  has been authorized for detection and/or diagnosis of SARS-CoV-2 by  FDA under an Emergency Use Authorization (EUA). This EUA will remain  in effect (meaning this test can be used) for the duration of the  Covid-19 declaration under Section 564(b)(1) of the Act, 21  U.S.C. section 360bbb-3(b)(1), unless the authorization is  terminated or revoked. Performed at [facility], [address] [place], [place] [address]   Blood culture (routine x 2)     Status: None   Collection Time: 02/18/20  6:29 AM   Specimen: BLOOD  Result Value Ref Range Status   Specimen Description BLOOD BLOOD LEFT HAND  Final   Special Requests   Final    BOTTLES DRAWN AEROBIC AND ANAEROBIC Blood Culture results may not be  optimal due to an excessive volume of blood received in culture bottles   Culture   Final    NO GROWTH 5 DAYS Performed at [facility], [address], [place], [place] [address]    Report Status 02/23/2020 FINAL  Final  Blood culture (routine x 2)     Status: None   Collection Time: 02/18/20  6:29 AM   Specimen: BLOOD  Result Value Ref Range Status   Specimen Description BLOOD LEFT ANTECUBITAL  Final   Special Requests   Final    BOTTLES DRAWN AEROBIC AND ANAEROBIC Blood Culture adequate volume   Culture   Final    NO GROWTH 5 DAYS Performed at [facility], [place], [place]  [address]    Report Status 02/23/2020 FINAL  Final  Urine culture     Status: None   Collection Time: 02/18/20 11:23 AM   Specimen: Urine, Random  Result Value Ref Range Status   Specimen Description   Final    URINE, RANDOM Performed at [facility], [address] [place], [place] [address]    Special Requests   Final    NONE Performed at [facility], [address], [place], [place] [address]    Culture   Final    NO GROWTH Performed at [facility], [place] [address] [place],  [address]    Report Status 02/19/2020 FINAL  Final    Coagulation Studies: No results for input(s): LABPROT, INR in the last 72 hours.  Urinalysis: No results for input(s): COLORURINE, LABSPEC, PHURINE, GLUCOSEU, HGBUR, BILIRUBINUR, KETONESUR, PROTEINUR, UROBILINOGEN, NITRITE, LEUKOCYTESUR in the last 72 hours.  Invalid input(s): APPERANCEUR    Imaging: CARDIAC CATHETERIZATION  Result Date: 02/23/2020  Hemodynamic findings consistent with pulmonary hypertension.      Medications:    . aspirin      . azithromycin  250 mg Oral Daily  . budesonide (PULMICORT) nebulizer solution  0.5 mg Nebulization BID  . clopidogrel  37.5 mg Oral Daily  . feeding supplement (GLUCERNA SHAKE)  237 mL Oral TID BM  . ferrous sulfate  325 mg Oral Q breakfast  .  furosemide  40 mg Intravenous Q12H  . gabapentin  100 mg Oral QHS  . [START ON 02/24/2020] heparin injection (subcutaneous)  5,000 Units Subcutaneous Q12H  . insulin aspart  0-5 Units Subcutaneous QHS  . insulin aspart  0-9 Units Subcutaneous TID WC  . insulin aspart  3 Units Subcutaneous TID WC  . [START ON 02/24/2020] insulin glargine  15 Units Subcutaneous Daily  . ipratropium-albuterol  3 mL Nebulization Q6H  . magic mouthwash  5 mL Oral TID  . melatonin  2.5 mg Oral QHS  . methylPREDNISolone (SOLU-MEDROL) injection  40 mg Intravenous Q24H  . metoprolol tartrate  25 mg Oral BID  . multivitamin-lutein  1 capsule Oral BID  . rosuvastatin  5 mg Oral Once per day on Mon Wed Fri Sat  . sodium chloride flush  3 mL Intravenous Q12H  . [START ON 02/24/2020] torsemide  40 mg Oral Daily   acetaminophen, albuterol, clonazepam, dextromethorphan-guaiFENesin, hydrALAZINE, morphine injection, nitroGLYCERIN, ondansetron (ZOFRAN) IV  Assessment/ Plan:  [name] is a 85 y.o. white female with hypertension, peripheral vascular disease, CVA/TIA, coronary artery disease, diabetes mellitus type II , who was admitted to [facility] on 02/18/2020 for Hyperglycemia [R73.9] Acute CHF (congestive heart failure) ([place]) [I50.9] Acute respiratory failure with hypoxia ([place]) [J96.01] Community acquired pneumonia, unspecified laterality [J18.9] Acute on chronic congestive heart failure, unspecified heart failure type ([place]) [I50.9]   1. Acute renal failure with hyperkalemia and metabolic acidosis: on chronic kidney disease stage IIIA with proteinuria.  Baseline creatinine of 1.1, GFR of 47 on 12/27/2019.  Chronic kidney disease secondary to hypertension and diabetes.  Acute renal failure most likely secondary to acute cardio-renal syndrome.  No IV contrast exposure. No obstruction on ultrasound.  No acute indication for dialysis - holding losartan  2. Hypertension: with acute exacerbation of diastolic congestive heart  failure.  - IV furosemide :  40mg  IV q12.   3. Hyponatremia: with volume overload and chlorthalidone.  - hold chlorthalidone.    LOS: 5 Reggie Bise 4/30/20215:34 PM

## 2020-02-23 NOTE — Plan of Care (Signed)

## 2020-02-23 NOTE — Progress Notes (Signed)
Patient ID: Jill Hudson, female   DOB: 05-Oct-1935, 85 y.o.   MRN: [id number] [facility] PROGRESS NOTE  AVINA EBERLE NLG:[id number] DOB: [birth year] DOA: 02/18/2020 PCP: [name], MD  HPI/Subjective: The patient is feeling a little bit better.  She stated she walked to the door and back.  As per the physical therapy note she walked around 70 feet.  Still having a little cough and shortness of breath.  Still feeling weak.  Objective: Vitals:   02/23/20 1430 02/23/20 1445  BP: (!) 137/58 (!) 136/57  Pulse: 71 71  Resp: 17 12  Temp:    SpO2: 99% 100%    Intake/Output Summary (Last 24 hours) at 02/23/2020 1459 Last data filed at 02/23/2020 1100 Gross per 24 hour  Intake 536.61 ml  Output 3100 ml  Net -2563.39 ml   Filed Weights   02/22/20 0500 02/23/20 0451 02/23/20 1302  Weight: 57.2 kg 56.7 kg 56.7 kg    ROS: Review of Systems  Constitutional: Positive for malaise/fatigue.  Eyes: Negative for blurred vision.  Respiratory: Positive for cough and shortness of breath.   Cardiovascular: Negative for chest pain.  Gastrointestinal: Negative for abdominal pain, nausea and vomiting.  Genitourinary: Negative for dysuria.  Musculoskeletal: Negative for joint pain.  Neurological: Negative for dizziness.   Exam: Physical Exam  Constitutional: She is oriented to person, place, and time.  HENT:  Nose: No mucosal edema.  Mouth/Throat: No oropharyngeal exudate or posterior oropharyngeal edema.  thrush in the mouth  Eyes: Conjunctivae and lids are normal.  Neck: Carotid bruit is not present.  Cardiovascular: S1 normal and S2 normal. Exam reveals no gallop.  No murmur heard. Respiratory: No respiratory distress. She has decreased breath sounds in the right lower field and the left lower field. She has wheezes in the right middle field and the left middle field. She has rhonchi in the right lower field and the left lower field. She has no rales.  GI: Soft. Bowel sounds are  normal. There is no abdominal tenderness.  Musculoskeletal:     Right ankle: No swelling.     Left ankle: No swelling.  Lymphadenopathy:    She has no cervical adenopathy.  Neurological: She is alert and oriented to person, place, and time. No cranial nerve deficit.  Skin: Skin is warm. No rash noted. Nails show no clubbing.  Psychiatric: She has a normal mood and affect.      Data Reviewed: Basic Metabolic Panel: Recent Labs  Lab 02/19/20 0506 02/20/20 0411 02/21/20 0545 02/22/20 0442 02/23/20 0621  NA 129* 127* 127* 129* 132*  K 5.1 4.8 5.5* 4.7 4.7  CL 98 96* 96* 96* 97*  CO2 20* 17* 18* 20* 21*  GLUCOSE 206* 232* 237* 156* 214*  BUN 57* 77* 93* 108* 116*  CREATININE 1.85* 2.33* 2.72* 2.74* 2.88*  CALCIUM 9.0 9.2 9.0 9.0 8.6*  MG 2.1  --   --   --   --    Liver Function Tests: Recent Labs  Lab 02/21/20 1417  AST 33  ALT 50*  ALKPHOS 78  BILITOT 0.7  PROT 6.1*  ALBUMIN 3.5   CBC: Recent Labs  Lab 02/18/20 0517 02/18/20 0517 02/19/20 0506 02/20/20 0917 02/21/20 0545 02/22/20 0442 02/23/20 0621  WBC 17.2*   < > 15.3* 25.2* 19.9* 15.5* 16.4*  NEUTROABS 13.3*  --   --  22.5* 16.9* 13.3*  --   HGB 10.2*   < > 9.3* 9.3* 9.4* 9.8* 9.6*  HCT 31.7*   < > 28.0* 29.1* 29.7* 29.3* 27.7*  MCV 91.4   < > 90.0 92.4 91.7 88.3 88.2  PLT 299   < > 268 339 302 287 289   < > = values in this interval not displayed.   BNP (last 3 results) Recent Labs    02/18/20 0517  BNP 600.0*    CBG: Recent Labs  Lab 02/22/20 1635 02/22/20 2124 02/23/20 0759 02/23/20 1151 02/23/20 1315  GLUCAP 125* 206* 214* 323* 270*    Recent Results (from the past 240 hour(s))  Respiratory Panel by RT PCR (Flu A&B, Covid) - Nasopharyngeal Swab     Status: None   Collection Time: 02/18/20  5:17 AM   Specimen: Nasopharyngeal Swab  Result Value Ref Range Status   SARS Coronavirus 2 by RT PCR NEGATIVE NEGATIVE Final    Comment: (NOTE) SARS-CoV-2 target nucleic acids are NOT  DETECTED. The SARS-CoV-2 RNA is generally detectable in upper respiratoy specimens during the acute phase of infection. The lowest concentration of SARS-CoV-2 viral copies this assay can detect is 131 copies/mL. A negative result does not preclude SARS-Cov-2 infection and should not be used as the sole basis for treatment or other patient management decisions. A negative result may occur with  improper specimen collection/handling, submission of specimen other than nasopharyngeal swab, presence of viral mutation(s) within the areas targeted by this assay, and inadequate number of viral copies (<131 copies/mL). A negative result must be combined with clinical observations, patient history, and epidemiological information. The expected result is Negative. Fact Sheet for Patients:  PinkCheek.be Fact Sheet for Healthcare Providers:  GravelBags.it This test is not yet ap proved or cleared by the [place] FDA and  has been authorized for detection and/or diagnosis of SARS-CoV-2 by FDA under an Emergency Use Authorization (EUA). This EUA will remain  in effect (meaning this test can be used) for the duration of the COVID-19 declaration under Section 564(b)(1) of the Act, 21 U.S.C. section 360bbb-3(b)(1), unless the authorization is terminated or revoked sooner.    Influenza A by PCR NEGATIVE NEGATIVE Final   Influenza B by PCR NEGATIVE NEGATIVE Final    Comment: (NOTE) The Xpert Xpress SARS-CoV-2/FLU/RSV assay is intended as an aid in  the diagnosis of influenza from Nasopharyngeal swab specimens and  should not be used as a sole basis for treatment. Nasal washings and  aspirates are unacceptable for Xpert Xpress SARS-CoV-2/FLU/RSV  testing. Fact Sheet for Patients: PinkCheek.be Fact Sheet for Healthcare Providers: GravelBags.it This test is not yet approved or cleared  by the [place] FDA and  has been authorized for detection and/or diagnosis of SARS-CoV-2 by  FDA under an Emergency Use Authorization (EUA). This EUA will remain  in effect (meaning this test can be used) for the duration of the  Covid-19 declaration under Section 564(b)(1) of the Act, 21  U.S.C. section 360bbb-3(b)(1), unless the authorization is  terminated or revoked. Performed at [facility], [address] [place], [place] [address]   Blood culture (routine x 2)     Status: None   Collection Time: 02/18/20  6:29 AM   Specimen: BLOOD  Result Value Ref Range Status   Specimen Description BLOOD BLOOD LEFT HAND  Final   Special Requests   Final    BOTTLES DRAWN AEROBIC AND ANAEROBIC Blood Culture results may not be optimal due to an excessive volume of blood received in culture bottles   Culture   Final  NO GROWTH 5 DAYS Performed at [facility], [address] [place], [place] [address]    Report Status 02/23/2020 FINAL  Final  Blood culture (routine x 2)     Status: None   Collection Time: 02/18/20  6:29 AM   Specimen: BLOOD  Result Value Ref Range Status   Specimen Description BLOOD LEFT ANTECUBITAL  Final   Special Requests   Final    BOTTLES DRAWN AEROBIC AND ANAEROBIC Blood Culture adequate volume   Culture   Final    NO GROWTH 5 DAYS Performed at [facility], [address], [place], [place] [address]    Report Status 02/23/2020 FINAL  Final  Urine culture     Status: None   Collection Time: 02/18/20 11:23 AM   Specimen: Urine, Random  Result Value Ref Range Status   Specimen Description   Final    URINE, RANDOM Performed at [facility], [address] [place], [place] [address]    Special Requests   Final    NONE Performed at [facility], [address], [name], [place] [address]    Culture   Final    NO GROWTH Performed at [facility], [place] [address] [place], [place] [address]     Report Status 02/19/2020 FINAL  Final     Studies: CARDIAC CATHETERIZATION  Result Date: 02/23/2020  Hemodynamic findings consistent with pulmonary hypertension.     Scheduled Meds: . aspirin      . [MAR Hold] azithromycin  250 mg Oral Daily  . [MAR Hold] budesonide (PULMICORT) nebulizer solution  0.5 mg Nebulization BID  . [MAR Hold] clopidogrel  37.5 mg Oral Daily  . [MAR Hold] feeding supplement (GLUCERNA SHAKE)  237 mL Oral TID BM  . [MAR Hold] ferrous sulfate  325 mg Oral Q breakfast  . furosemide  40 mg Intravenous Q12H  . [MAR Hold] gabapentin  100 mg Oral QHS  . [START ON 02/24/2020] heparin injection (subcutaneous)  5,000 Units Subcutaneous Q12H  . [MAR Hold] insulin aspart  0-5 Units Subcutaneous QHS  . [MAR Hold] insulin aspart  0-9 Units Subcutaneous TID WC  . [MAR Hold] insulin aspart  3 Units Subcutaneous TID WC  . [MAR Hold] insulin glargine  10 Units Subcutaneous Daily  . [MAR Hold] ipratropium-albuterol  3 mL Nebulization Q6H  . [MAR Hold] magic mouthwash  5 mL Oral TID  . [MAR Hold] melatonin  2.5 mg Oral QHS  . [MAR Hold] methylPREDNISolone (SOLU-MEDROL) injection  40 mg Intravenous Q24H  . [MAR Hold] metoprolol tartrate  25 mg Oral BID  . [MAR Hold] multivitamin-lutein  1 capsule Oral BID  . [MAR Hold] rosuvastatin  5 mg Oral Once per day on Mon Wed Fri Sat  . [MAR Hold] sodium chloride flush  3 mL Intravenous Q12H  . [START ON 02/24/2020] torsemide  40 mg Oral Daily   Continuous Infusions: . sodium chloride 10 mL/hr at 02/23/20 1309    Assessment/Plan:  1. Acute hypoxic respiratory failure.  Patient on BiPAP overnight and was on 3 L this morning and pulse ox is 100%.  With ambulation did desaturate.  We will try room air pulse ox tomorrow morning. 2. NSTEMI.  Patient completed 48 hours of heparin drip..  Patient on Plavix and metoprolol. 3. COPD exacerbation.  Continue Solu-Medrol daily dosing and nebulizer treatments. 4. Acute diastolic congestive heart  failure with worsening creatinine.  Cardiology did a right heart cath which shows normal right-sided pressures.  Patient will be changed over to oral diuresis starting tomorrow. 5. Acute kidney injury on chronic kidney disease stage IIIa.  Creatinine seems to have stabilized at 2.88.  Check creatinine daily and need to watch closely with diuresis. 6. Hyponatremia secondary to congestive heart failure.  Sodium trending better to 132. 7. Peripheral vascular disease on Plavix and Crestor 8. Type 2 diabetes mellitus with chronic kidney disease stage IIIa.  We will increase Lantus to 15 units daily starting tomorrow. [place].  On nystatin swish and swallow  Code Status:     Code Status Orders  (From admission, onward)         Start     Ordered   02/18/20 1100  Full code  Continuous     02/18/20 1059        Code Status History    Date Active Date Inactive Code Status Order ID Comments User Context   12/25/2019 0555 12/28/2019 2204 Full Code [id number]  Christel Mormon, MD ED   10/12/2018 1647 10/15/2018 1808 Full Code [id number]  Katha Cabal, MD Inpatient   09/16/2018 1254 09/16/2018 2048 Full Code [id number]  [name] Dolores Lory, MD Inpatient   08/11/2018 1706 08/13/2018 2136 Full Code [id number]  Gorden Harms, MD Inpatient   07/26/2018 0921 07/26/2018 1652 Full Code [id number]  [name], Dolores Lory, MD Inpatient   08/23/2017 0935 08/23/2017 1419 Full Code [id number]  Algernon Huxley, MD Inpatient   02/15/2017 1538 02/16/2017 1959 Full Code [id number]  Dustin Flock, MD Inpatient   Advance Care Planning Activity     Family Communication: Spoke the patient's daughter on the phone and given update. Disposition Plan: I would like to see the patient's creatinine peak and then start to improve.  I would like to get the patient off oxygen if possible.  Patient will likely require a few more days here in the hospital in order to make a safe disposition home with home health.  The patient did better  today with physical therapy.  Consultants:  Cardiology  Nephrology  Time spent: 27 minutes, case discussed with nephrology and cardiology.  [place]  [facility] [employer]

## 2020-02-23 NOTE — Care Management Important Message (Signed)
Important Message  Patient Details  Name: Jill Hudson MRN: [id number] Date of Birth: Mar 15, 1935   Medicare Important Message Given:  Yes     Dannette Barbara 02/23/2020, 11:52 AM

## 2020-02-23 NOTE — Progress Notes (Signed)
Small amount of bleeding noted on Right femoral site. Pressure applied and new dressing applied to site. Will continue to monitor.

## 2020-02-23 NOTE — Plan of Care (Signed)
  Problem: Education: Goal: Knowledge of General Education information will improve Description Including pain rating scale, medication(s)/side effects and non-pharmacologic comfort measures Outcome: Progressing   

## 2020-02-23 NOTE — Progress Notes (Signed)
Anticoagulation monitoring(Lovenox):  85 yo female ordered Lovenox 30 mg Q24h  Filed Weights   02/22/20 0500 02/23/20 0451 02/23/20 1302  Weight: 57.2 kg (126 lb 3.2 oz) 56.7 kg (125 lb 1.6 oz) 56.7 kg (125 lb)    Lab Results  Component Value Date   CREATININE 2.88 (H) 02/23/2020   CREATININE 2.74 (H) 02/22/2020   CREATININE 2.72 (H) 02/21/2020   Estimated Creatinine Clearance: 11.7 mL/min (A) (by C-G formula based on SCr of 2.88 mg/dL (H)). Hemoglobin & Hematocrit     Component Value Date/Time   HGB 9.6 (L) 02/23/2020 0621   HGB 8.9 (L) 08/23/2018 1534   HCT 27.7 (L) 02/23/2020 0621   HCT 27.4 (L) 08/23/2018 1534     Per Protocol for Patient with estCrcl < 15 ml/min, will transition to heparin SQ 5000 units q12h

## 2020-02-24 LAB — CBC
HCT: 29.7 % — ABNORMAL LOW (ref 36.0–46.0)
Hemoglobin: 10 g/dL — ABNORMAL LOW (ref 12.0–15.0)
MCH: 29.8 pg (ref 26.0–34.0)
MCHC: 33.7 g/dL (ref 30.0–36.0)
MCV: 88.4 fL (ref 80.0–100.0)
Platelets: 326 10*3/uL (ref 150–400)
RBC: 3.36 MIL/uL — ABNORMAL LOW (ref 3.87–5.11)
RDW: 13.9 % (ref 11.5–15.5)
WBC: 19.2 10*3/uL — ABNORMAL HIGH (ref 4.0–10.5)
nRBC: 0.3 % — ABNORMAL HIGH (ref 0.0–0.2)

## 2020-02-24 LAB — BASIC METABOLIC PANEL
Anion gap: 12 (ref 5–15)
BUN: 121 mg/dL — ABNORMAL HIGH (ref 8–23)
CO2: 23 mmol/L (ref 22–32)
Calcium: 8.3 mg/dL — ABNORMAL LOW (ref 8.9–10.3)
Chloride: 96 mmol/L — ABNORMAL LOW (ref 98–111)
Creatinine, Ser: 2.59 mg/dL — ABNORMAL HIGH (ref 0.44–1.00)
GFR calc Af Amer: 19 mL/min — ABNORMAL LOW (ref >60)
GFR calc non Af Amer: 16 mL/min — ABNORMAL LOW (ref >60)
Glucose, Bld: 166 mg/dL — ABNORMAL HIGH (ref 70–99)
Potassium: 3.8 mmol/L (ref 3.5–5.1)
Sodium: 131 mmol/L — ABNORMAL LOW (ref 135–145)

## 2020-02-24 LAB — GLUCOSE, CAPILLARY
Glucose-Capillary: 156 mg/dL — ABNORMAL HIGH (ref 70–99)
Glucose-Capillary: 168 mg/dL — ABNORMAL HIGH (ref 70–99)
Glucose-Capillary: 246 mg/dL — ABNORMAL HIGH (ref 70–99)
Glucose-Capillary: 331 mg/dL — ABNORMAL HIGH (ref 70–99)

## 2020-02-24 MED ORDER — POLYETHYLENE GLYCOL 3350 17 G PO PACK
17.0000 g | PACK | Freq: Every day | ORAL | Status: DC
  Administered 2020-02-26: 17 g via ORAL
  Filled 2020-02-24: qty 1

## 2020-02-24 MED ORDER — IPRATROPIUM-ALBUTEROL 0.5-2.5 (3) MG/3ML IN SOLN
3.0000 mL | Freq: Three times a day (TID) | RESPIRATORY_TRACT | Status: DC
  Administered 2020-02-24 – 2020-02-25 (×2): 3 mL via RESPIRATORY_TRACT
  Filled 2020-02-24 (×2): qty 3

## 2020-02-24 MED ORDER — LIDOCAINE-EPINEPHRINE 1 %-1:100000 IJ SOLN
10.0000 mL | Freq: Once | INTRAMUSCULAR | Status: AC
  Administered 2020-02-24: 10 mL via INTRADERMAL
  Filled 2020-02-24: qty 10

## 2020-02-24 MED ORDER — INSULIN GLARGINE 100 UNIT/ML ~~LOC~~ SOLN
10.0000 [IU] | Freq: Every day | SUBCUTANEOUS | Status: DC
  Administered 2020-02-25 – 2020-02-26 (×2): 10 [IU] via SUBCUTANEOUS
  Filled 2020-02-24 (×3): qty 0.1

## 2020-02-24 NOTE — Progress Notes (Addendum)
Right groin site continued to oozed overnight, PAD applied to site this morning. No hematoma or ecchymosis. Dr. Oval Linsey notified. Per MD will inject lidocaine and epi to site. Christell Faith, PA on the floor and notified as well.Will continue to monitor.

## 2020-02-24 NOTE — Progress Notes (Signed)
Progress Note  Patient Name: Jill Hudson Date of Encounter: 02/24/2020  Primary Cardiologist: [facility]  Subjective   Ruth 4/30 showed high normal right heart pressures as outlined below.  Mild oozing coming from her right femoral RHC cath site.  BP and hemoglobin stable.  Attending is aware per RN.  Dyspnea is much improved.  She has been transitioned to room air at rest this morning. No chest pain or palpitations. Documented UOP of 2.8 L for the past 24 hours with a net - 3.4 L for the admission. Weight 56.7-->53.3 kg over the past 24 hours. Renal function improved this morning.  She has developed new onset A. fib with controlled ventricular response.  Inpatient Medications    Scheduled Meds: . azithromycin  250 mg Oral Daily  . budesonide (PULMICORT) nebulizer solution  0.5 mg Nebulization BID  . clopidogrel  37.5 mg Oral Daily  . feeding supplement (GLUCERNA SHAKE)  237 mL Oral TID BM  . ferrous sulfate  325 mg Oral Q breakfast  . gabapentin  100 mg Oral QHS  . heparin injection (subcutaneous)  5,000 Units Subcutaneous Q12H  . insulin aspart  0-5 Units Subcutaneous QHS  . insulin aspart  0-9 Units Subcutaneous TID WC  . insulin aspart  3 Units Subcutaneous TID WC  . insulin glargine  15 Units Subcutaneous Daily  . ipratropium-albuterol  3 mL Nebulization Q6H  . magic mouthwash  5 mL Oral TID  . melatonin  2.5 mg Oral QHS  . methylPREDNISolone (SOLU-MEDROL) injection  40 mg Intravenous Q24H  . metoprolol tartrate  25 mg Oral BID  . multivitamin-lutein  1 capsule Oral BID  . rosuvastatin  5 mg Oral Once per day on Mon Wed Fri Sat  . sodium chloride flush  3 mL Intravenous Q12H  . torsemide  40 mg Oral Daily   Continuous Infusions:  PRN Meds: acetaminophen, albuterol, clonazepam, dextromethorphan-guaiFENesin, hydrALAZINE, morphine injection, nitroGLYCERIN, ondansetron (ZOFRAN) IV   Vital Signs    Vitals:   02/23/20 1520 02/23/20 2000 02/23/20 2014 02/24/20 0418  BP: (!)  138/55 (!) 145/55  117/85  Pulse: 79 81  92  Resp: 18 20  20   Temp: 98.4 F (36.9 C) 97.6 F (36.4 C)  (!) 97.5 F (36.4 C)  TempSrc: Oral Oral  Oral  SpO2: 98% 100% 98% 100%  Weight:    53.3 kg  Height:        Intake/Output Summary (Last 24 hours) at 02/24/2020 0737 Last data filed at 02/24/2020 0419 Gross per 24 hour  Intake 28.5 ml  Output 2850 ml  Net -2821.5 ml   Filed Weights   02/23/20 0451 02/23/20 1302 02/24/20 0418  Weight: 56.7 kg 56.7 kg 53.3 kg    Telemetry   A. fib, 70s to 90s bpm- Personally Reviewed  ECG    No new tracings - Personally Reviewed  Physical Exam   GEN: No acute distress.   Neck: No JVD. Cardiac:  Irregularly irregular, no murmurs, rubs, or gallops.  Pressure dressing noted at the right femoral cardiac cath site with mild oozing.  No bruit or significant flank ecchymosis. Respiratory: Clear to auscultation bilaterally.  GI: Soft, nontender, non-distended.   MS: No edema; No deformity. Neuro:  Alert and oriented x 3; Nonfocal.  Psych: Normal affect.  Labs    Chemistry Recent Labs  Lab 02/21/20 0545 02/21/20 1417 02/22/20 0442 02/23/20 0621  NA 127*  --  129* 132*  K 5.5*  --  4.7  4.7  CL 96*  --  96* 97*  CO2 18*  --  20* 21*  GLUCOSE 237*  --  156* 214*  BUN 93*  --  108* 116*  CREATININE 2.72*  --  2.74* 2.88*  CALCIUM 9.0  --  9.0 8.6*  PROT  --  6.1*  --   --   ALBUMIN  --  3.5  --   --   AST  --  33  --   --   ALT  --  50*  --   --   ALKPHOS  --  78  --   --   BILITOT  --  0.7  --   --   GFRNONAA 16*  --  15* 14*  GFRAA 18*  --  18* 17*  ANIONGAP 13  --  13 14     Hematology Recent Labs  Lab 02/22/20 0442 02/23/20 0621 02/24/20 0459  WBC 15.5* 16.4* 19.2*  RBC 3.32* 3.14* 3.36*  HGB 9.8* 9.6* 10.0*  HCT 29.3* 27.7* 29.7*  MCV 88.3 88.2 88.4  MCH 29.5 30.6 29.8  MCHC 33.4 34.7 33.7  RDW 13.7 13.7 13.9  PLT 287 289 326    Cardiac EnzymesNo results for input(s): TROPONINI in the last 168 hours. No  results for input(s): TROPIPOC in the last 168 hours.   BNP Recent Labs  Lab 02/18/20 0517  BNP 600.0*     DDimer No results for input(s): DDIMER in the last 168 hours.   Radiology    US RENAL  Result Date: 02/21/2020 IMPRESSION: 1. Renal echogenicity mildly increased bilaterally, a finding that may be indicative of a degree of medical renal disease. Renal cortical thickness within normal limits bilaterally. No obstructing focus in either kidney. 2.  There are cysts in the left kidney. 3.  Bilateral pleural effusions. Electronically Signed   By: Lowella Grip III M.D.   On: 02/21/2020 11:02    Cardiac Studies   RHC 02/23/2020: Right heart pressures RA mean of 3 RV 37/2 and 9 PA 37/11 mean 24 Wedge pressure mean 13 Cardiac output 3.11 Cardiac index 2.04  Final Conclusions:   High normal right heart pressures Mean PA pressure 24, wedge 13, RA 3   Recommendations:  Given dramatic improvement in her symptoms, less abdominal bloating, improved shortness of breath, Consider transition to oral diuretics tomorrow Will discuss with nephrology and hospitalist service __________  2D echo 02/19/2020: 1. Left ventricular ejection fraction, by estimation, is 55 to 60%. The  left ventricle has normal function. The left ventricle has no regional  wall motion abnormalities. There is mild left ventricular hypertrophy.  Left ventricular diastolic parameters  are consistent with Grade II diastolic dysfunction (pseudonormalization).  2. Right ventricular systolic function is normal. The right ventricular  size is normal. There is moderately elevated pulmonary artery systolic  pressure.  3. Left atrial size was mildly dilated.  4. The mitral valve is myxomatous. Moderate mitral valve regurgitation.  No evidence of mitral stenosis.  5. The aortic valve is normal in structure. Aortic valve regurgitation is  not visualized. Mild aortic valve sclerosis is present, with no evidence   of aortic valve stenosis.  6. The inferior vena cava is dilated in size with >50% respiratory  variability, suggesting right atrial pressure of 8 mmHg.   Patient Profile     85 y.o. female with history of CAD, GI bleed with anemia, CKD stage III, CVA, DM2, HTN, HLD admitted with acute hypoxic respiratory distress  with NSTEMI, acute on chronic HFpEF/pulmonary hypertension  Assessment & Plan    1. CAD with NSTEMI: -Prior LHC in 1998 with 40% left main and 95% ramus stenoses  -She has completed > 48 hours of heparin gtt -Plavix  -Ideally, she would undergo a LHC, though this is unable to be performed at this time with worsening renal function -Ambulate to assess for symptoms to help gauge potential inpatient Myoview early next week to evaluate for high risk ischemia   2. Acute hypoxic respiratory distress: -Multifactorial including acute on chronic HFpEF, pulmonary hypertension, AECOPD and anemia -She remains on supplemental oxygen via nasal cannula at 4 L this morning -RHC with high normal right heart pressures with slight oozing from the cardiac cath site this morning.  Per RN, attending MD is aware and plans to inject lidocaine with epinephrine.  Vitals and hemoglobin stable -Wean oxygen as tolerated -Ambulate following injection of cath site  3. HFpEF/pulmonary hypertension: -Volume status is improved -Transitioned to torsemide 40 mg daily this morning -Renal function improving   4.  New onset A. Fib: -Controlled ventricular response -Continue metoprolol -Not currently on anticoagulation given ongoing oozing from cardiac cath site as outlined above, this, and her Plavix, will need to be revisited prior to discharge -CHA2DS2-VASc 9 (CHF, HTN, age x 2, DM, stroke x 2, vascular disease, gender) -Ambulate to assess for symptom and ventricular rate control this weekend  5. Acute on CKD stage III: -Renal function is improving this morning -Renal ultrasound without obstruction    6. History of GI bleed with anemia: -HGB low, though stable  7. HTN: -Blood pressure is reasonably controlled -Torsemide -Metoprolol   8. HLD: -LDL 54 this admission  -Crestor   9. Hyponatremia: -Stable -Free water restriction    For questions or updates, please contact [place] Please consult [email or web address] for contact info under Cardiology/STEMI.    Signed, Christell Faith, PA-C [place] Pager: [phone] 02/24/2020, 7:37 AM

## 2020-02-24 NOTE — Progress Notes (Signed)
[facility]  ROUNDING NOTE   Subjective:   Patient states she is breathing better.  Lying comfortably in the bed No acute complaints States she is able to eat a little better although appetite is low  Objective:  Vital signs in last 24 hours:  Temp:  [97.5 F (36.4 C)-98.4 F (36.9 C)] 97.6 F (36.4 C) (05/01 0829) Pulse Rate:  [71-92] 92 (05/01 0829) Resp:  [12-20] 18 (05/01 0829) BP: (117-145)/(49-85) 142/81 (05/01 0829) SpO2:  [97 %-100 %] 100 % (05/01 0829) Weight:  [53.3 kg-56.7 kg] 53.3 kg (05/01 0418)  Weight change: -0.045 kg Filed Weights   02/23/20 0451 02/23/20 1302 02/24/20 0418  Weight: 56.7 kg 56.7 kg 53.3 kg    Intake/Output: I/O last 3 completed shifts: In: 205.1 [I.V.:205.1] Out: 5573 [Urine:4050]   Intake/Output this shift:  No intake/output data recorded.  Physical Exam: General: NAD,   HEENT Anicteric, dry mouth  Neck: Supple, t  Lungs:  Bilateral crackles at bases  Heart: Regular rate and rhythm  Abdomen:  Soft, nontender,   Extremities:  trace peripheral edema.  Neurologic: Nonfocal, moving all four extremities  Skin: No lesions        Basic Metabolic Panel: Recent Labs  Lab 02/19/20 0506 02/19/20 0506 02/20/20 0411 02/20/20 0411 02/21/20 0545 02/21/20 0545 02/22/20 0442 02/23/20 0621 02/24/20 0459  NA 129*   < > 127*  --  127*  --  129* 132* 131*  K 5.1   < > 4.8  --  5.5*  --  4.7 4.7 3.8  CL 98   < > 96*  --  96*  --  96* 97* 96*  CO2 20*   < > 17*  --  18*  --  20* 21* 23  GLUCOSE 206*   < > 232*  --  237*  --  156* 214* 166*  BUN 57*   < > 77*  --  93*  --  108* 116* 121*  CREATININE 1.85*   < > 2.33*  --  2.72*  --  2.74* 2.88* 2.59*  CALCIUM 9.0   < > 9.2   < > 9.0   < > 9.0 8.6* 8.3*  MG 2.1  --   --   --   --   --   --   --   --    < > = values in this interval not displayed.    Liver Function Tests: Recent Labs  Lab 02/21/20 1417  AST 33  ALT 50*  ALKPHOS 78  BILITOT 0.7  PROT 6.1*  ALBUMIN  3.5   No results for input(s): LIPASE, AMYLASE in the last 168 hours. No results for input(s): AMMONIA in the last 168 hours.  CBC: Recent Labs  Lab 02/18/20 0517 02/19/20 0506 02/20/20 0917 02/21/20 0545 02/22/20 0442 02/23/20 0621 02/24/20 0459  WBC 17.2*   < > 25.2* 19.9* 15.5* 16.4* 19.2*  NEUTROABS 13.3*  --  22.5* 16.9* 13.3*  --   --   HGB 10.2*   < > 9.3* 9.4* 9.8* 9.6* 10.0*  HCT 31.7*   < > 29.1* 29.7* 29.3* 27.7* 29.7*  MCV 91.4   < > 92.4 91.7 88.3 88.2 88.4  PLT 299   < > 339 302 287 289 326   < > = values in this interval not displayed.    Cardiac Enzymes: No results for input(s): CKTOTAL, CKMB, CKMBINDEX, TROPONINI in the last 168 hours.  BNP: Invalid input(s): POCBNP  CBG:  Recent Labs  Lab 02/23/20 1151 02/23/20 1315 02/23/20 1620 02/23/20 2224 02/24/20 0804  GLUCAP 323* 270* 113* 309* 156*    Microbiology: Results for orders placed or performed during the hospital encounter of 02/18/20  Respiratory Panel by RT PCR (Flu A&B, Covid) - Nasopharyngeal Swab     Status: None   Collection Time: 02/18/20  5:17 AM   Specimen: Nasopharyngeal Swab  Result Value Ref Range Status   SARS Coronavirus 2 by RT PCR NEGATIVE NEGATIVE Final    Comment: (NOTE) SARS-CoV-2 target nucleic acids are NOT DETECTED. The SARS-CoV-2 RNA is generally detectable in upper respiratoy specimens during the acute phase of infection. The lowest concentration of SARS-CoV-2 viral copies this assay can detect is 131 copies/mL. A negative result does not preclude SARS-Cov-2 infection and should not be used as the sole basis for treatment or other patient management decisions. A negative result may occur with  improper specimen collection/handling, submission of specimen other than nasopharyngeal swab, presence of viral mutation(s) within the areas targeted by this assay, and inadequate number of viral copies (<131 copies/mL). A negative result must be combined with  clinical observations, patient history, and epidemiological information. The expected result is Negative. Fact Sheet for Patients:  PinkCheek.be Fact Sheet for Healthcare Providers:  GravelBags.it This test is not yet ap proved or cleared by the [place] FDA and  has been authorized for detection and/or diagnosis of SARS-CoV-2 by FDA under an Emergency Use Authorization (EUA). This EUA will remain  in effect (meaning this test can be used) for the duration of the COVID-19 declaration under Section 564(b)(1) of the Act, 21 U.S.C. section 360bbb-3(b)(1), unless the authorization is terminated or revoked sooner.    Influenza A by PCR NEGATIVE NEGATIVE Final   Influenza B by PCR NEGATIVE NEGATIVE Final    Comment: (NOTE) The Xpert Xpress SARS-CoV-2/FLU/RSV assay is intended as an aid in  the diagnosis of influenza from Nasopharyngeal swab specimens and  should not be used as a sole basis for treatment. Nasal washings and  aspirates are unacceptable for Xpert Xpress SARS-CoV-2/FLU/RSV  testing. Fact Sheet for Patients: PinkCheek.be Fact Sheet for Healthcare Providers: GravelBags.it This test is not yet approved or cleared by the [place] FDA and  has been authorized for detection and/or diagnosis of SARS-CoV-2 by  FDA under an Emergency Use Authorization (EUA). This EUA will remain  in effect (meaning this test can be used) for the duration of the  Covid-19 declaration under Section 564(b)(1) of the Act, 21  U.S.C. section 360bbb-3(b)(1), unless the authorization is  terminated or revoked. Performed at [facility], [address] [place], [place] [address]   Blood culture (routine x 2)     Status: None   Collection Time: 02/18/20  6:29 AM   Specimen: BLOOD  Result Value Ref Range Status   Specimen Description BLOOD BLOOD LEFT HAND  Final    Special Requests   Final    BOTTLES DRAWN AEROBIC AND ANAEROBIC Blood Culture results may not be optimal due to an excessive volume of blood received in culture bottles   Culture   Final    NO GROWTH 5 DAYS Performed at [facility], [address], [place], [place] [address]    Report Status 02/23/2020 FINAL  Final  Blood culture (routine x 2)     Status: None   Collection Time: 02/18/20  6:29 AM   Specimen: BLOOD  Result Value Ref Range Status   Specimen Description BLOOD  LEFT ANTECUBITAL  Final   Special Requests   Final    BOTTLES DRAWN AEROBIC AND ANAEROBIC Blood Culture adequate volume   Culture   Final    NO GROWTH 5 DAYS Performed at [facility], [address] [place], [place] [address]    Report Status 02/23/2020 FINAL  Final  Urine culture     Status: None   Collection Time: 02/18/20 11:23 AM   Specimen: Urine, Random  Result Value Ref Range Status   Specimen Description   Final    URINE, RANDOM Performed at [facility], [address] [place], [place] [address]    Special Requests   Final    NONE Performed at [facility], [address], [place], [place] [address]    Culture   Final    NO GROWTH Performed at [facility], [place] [address], [place],  [address]    Report Status 02/19/2020 FINAL  Final    Coagulation Studies: No results for input(s): LABPROT, INR in the last 72 hours.  Urinalysis: No results for input(s): COLORURINE, LABSPEC, PHURINE, GLUCOSEU, HGBUR, BILIRUBINUR, KETONESUR, PROTEINUR, UROBILINOGEN, NITRITE, LEUKOCYTESUR in the last 72 hours.  Invalid input(s): APPERANCEUR    Imaging: CARDIAC CATHETERIZATION  Result Date: 02/23/2020  Hemodynamic findings consistent with pulmonary hypertension.      Medications:    . budesonide (PULMICORT) nebulizer solution  0.5 mg Nebulization BID  . clopidogrel  37.5 mg Oral Daily  . feeding supplement (GLUCERNA SHAKE)  237 mL Oral TID BM   . ferrous sulfate  325 mg Oral Q breakfast  . gabapentin  100 mg Oral QHS  . heparin injection (subcutaneous)  5,000 Units Subcutaneous Q12H  . insulin aspart  0-5 Units Subcutaneous QHS  . insulin aspart  0-9 Units Subcutaneous TID WC  . insulin aspart  3 Units Subcutaneous TID WC  . insulin glargine  15 Units Subcutaneous Daily  . ipratropium-albuterol  3 mL Nebulization Q6H  . magic mouthwash  5 mL Oral TID  . melatonin  2.5 mg Oral QHS  . methylPREDNISolone (SOLU-MEDROL) injection  40 mg Intravenous Q24H  . metoprolol tartrate  25 mg Oral BID  . multivitamin-lutein  1 capsule Oral BID  . rosuvastatin  5 mg Oral Once per day on Mon Wed Fri Sat  . sodium chloride flush  3 mL Intravenous Q12H  . torsemide  40 mg Oral Daily   acetaminophen, albuterol, clonazepam, dextromethorphan-guaiFENesin, hydrALAZINE, morphine injection, nitroGLYCERIN, ondansetron (ZOFRAN) IV  Assessment/ Plan:  Ms. JOYELL EMAMI is a 85 y.o. white female with hypertension, peripheral vascular disease, CVA/TIA, coronary artery disease, diabetes mellitus type II , who was admitted to [facility] on 02/18/2020 for Hyperglycemia [R73.9] Acute CHF (congestive heart failure) ([place]) [I50.9] Acute respiratory failure with hypoxia ([place]) [J96.01] Community acquired pneumonia, unspecified laterality [J18.9] Acute on chronic congestive heart failure, unspecified heart failure type ([place]) [I50.9]   1. Acute renal failure with hyperkalemia and metabolic acidosis: on chronic kidney disease stage IIIA with proteinuria.  Baseline creatinine of 1.1, GFR of 47 on 12/27/2019.  Chronic kidney disease secondary to hypertension and diabetes.  Acute renal failure most likely secondary to acute cardio-renal syndrome.  No IV contrast exposure. No obstruction on ultrasound.    Lab Results  Component Value Date   CREATININE 2.59 (H) 02/24/2020   CREATININE 2.88 (H) 02/23/2020   CREATININE 2.74 (H) 02/22/2020   04/30 0701 - 05/01 0700 In: 28.5  [I.V.:28.5] Out: 2850 [Urine:2850] Serum creatinine  has started to improve.  Continue to monitor  2. Hypertension: with acute exacerbation of diastolic congestive heart failure.  -Agree with changing IV furosemide to oral torsemide  3. Hyponatremia: with volume overload and chlorthalidone.  - hold chlorthalidone.       LOS: 6 Mukund Weinreb 5/1/20218:49 AM

## 2020-02-24 NOTE — Progress Notes (Signed)
SATURATION QUALIFICATIONS: (This note is used to comply with regulatory documentation for home oxygen)  Patient Saturations on Room Air at Rest = 100%  Patient Saturations on Room Air while Ambulating = 96%  Patient Saturations on n/a Liters of oxygen while Ambulating = n/a%  Please briefly explain why patient needs home oxygen:

## 2020-02-24 NOTE — Progress Notes (Signed)
Patient ID: [name], female   DOB: 1935-09-16, 85 y.o.   MRN: [id number] [facility] PROGRESS NOTE  AINO HECKERT [id number] DOB: 09-14-1935 DOA: 02/18/2020 PCP: [name], MD  HPI/Subjective: The patient is feeling a little bit better.  She did better with physical therapy yesterday.  She had some bleeding from her right groin site overnight.  Objective: Vitals:   02/24/20 0900 02/24/20 1153  BP:  117/65  Pulse:  93  Resp:  18  Temp:    SpO2: 98% 100%    Intake/Output Summary (Last 24 hours) at 02/24/2020 1437 Last data filed at 02/24/2020 1100 Gross per 24 hour  Intake 28.5 ml  Output 2950 ml  Net -2921.5 ml   Filed Weights   02/23/20 0451 02/23/20 1302 02/24/20 0418  Weight: 56.7 kg 56.7 kg 53.3 kg    ROS: Review of Systems  Constitutional: Positive for malaise/fatigue.  Eyes: Negative for blurred vision.  Respiratory: Positive for cough and shortness of breath.   Cardiovascular: Negative for chest pain.  Gastrointestinal: Negative for abdominal pain, nausea and vomiting.  Genitourinary: Negative for dysuria.  Musculoskeletal: Negative for joint pain.  Neurological: Negative for dizziness.   Exam: Physical Exam  Constitutional: She is oriented to person, place, and time.  HENT:  Nose: No mucosal edema.  Mouth/Throat: No oropharyngeal exudate or posterior oropharyngeal edema.  thrush in the mouth  Eyes: Conjunctivae and lids are normal.  Neck: Carotid bruit is not present.  Cardiovascular: S1 normal and S2 normal. Exam reveals no gallop.  No murmur heard. Respiratory: No respiratory distress. She has decreased breath sounds in the right lower field and the left lower field. She has no wheezes. She has no rhonchi. She has no rales.  GI: Soft. Bowel sounds are normal. There is no abdominal tenderness.  Musculoskeletal:     Right ankle: No swelling.     Left ankle: No swelling.  Lymphadenopathy:    She has no cervical adenopathy.  Neurological:  She is alert and oriented to person, place, and time. No cranial nerve deficit.  Skin: Skin is warm. No rash noted. Nails show no clubbing.  Psychiatric: She has a normal mood and affect.      Data Reviewed: Basic Metabolic Panel: Recent Labs  Lab 02/19/20 0506 02/19/20 0506 02/20/20 0411 02/21/20 0545 02/22/20 0442 02/23/20 0621 02/24/20 0459  NA 129*   < > 127* 127* 129* 132* 131*  K 5.1   < > 4.8 5.5* 4.7 4.7 3.8  CL 98   < > 96* 96* 96* 97* 96*  CO2 20*   < > 17* 18* 20* 21* 23  GLUCOSE 206*   < > 232* 237* 156* 214* 166*  BUN 57*   < > 77* 93* 108* 116* 121*  CREATININE 1.85*   < > 2.33* 2.72* 2.74* 2.88* 2.59*  CALCIUM 9.0   < > 9.2 9.0 9.0 8.6* 8.3*  MG 2.1  --   --   --   --   --   --    < > = values in this interval not displayed.   Liver Function Tests: Recent Labs  Lab 02/21/20 1417  AST 33  ALT 50*  ALKPHOS 78  BILITOT 0.7  PROT 6.1*  ALBUMIN 3.5   CBC: Recent Labs  Lab 02/18/20 0517 02/19/20 0506 02/20/20 0917 02/21/20 0545 02/22/20 0442 02/23/20 0621 02/24/20 0459  WBC 17.2*   < > 25.2* 19.9* 15.5* 16.4* 19.2*  NEUTROABS 13.3*  --  22.5* 16.9* 13.3*  --   --   HGB 10.2*   < > 9.3* 9.4* 9.8* 9.6* 10.0*  HCT 31.7*   < > 29.1* 29.7* 29.3* 27.7* 29.7*  MCV 91.4   < > 92.4 91.7 88.3 88.2 88.4  PLT 299   < > 339 302 287 289 326   < > = values in this interval not displayed.   BNP (last 3 results) Recent Labs    02/18/20 0517  BNP 600.0*    CBG: Recent Labs  Lab 02/23/20 1315 02/23/20 1620 02/23/20 2224 02/24/20 0804 02/24/20 1153  GLUCAP 270* 113* 309* 156* 168*    Recent Results (from the past 240 hour(s))  Respiratory Panel by RT PCR (Flu A&B, Covid) - Nasopharyngeal Swab     Status: None   Collection Time: 02/18/20  5:17 AM   Specimen: Nasopharyngeal Swab  Result Value Ref Range Status   SARS Coronavirus 2 by RT PCR NEGATIVE NEGATIVE Final    Comment: (NOTE) SARS-CoV-2 target nucleic acids are NOT DETECTED. The SARS-CoV-2  RNA is generally detectable in upper respiratoy specimens during the acute phase of infection. The lowest concentration of SARS-CoV-2 viral copies this assay can detect is 131 copies/mL. A negative result does not preclude SARS-Cov-2 infection and should not be used as the sole basis for treatment or other patient management decisions. A negative result may occur with  improper specimen collection/handling, submission of specimen other than nasopharyngeal swab, presence of viral mutation(s) within the areas targeted by this assay, and inadequate number of viral copies (<131 copies/mL). A negative result must be combined with clinical observations, patient history, and epidemiological information. The expected result is Negative. Fact Sheet for Patients:  PinkCheek.be Fact Sheet for Healthcare Providers:  GravelBags.it This test is not yet ap proved or cleared by the [place] FDA and  has been authorized for detection and/or diagnosis of SARS-CoV-2 by FDA under an Emergency Use Authorization (EUA). This EUA will remain  in effect (meaning this test can be used) for the duration of the COVID-19 declaration under Section 564(b)(1) of the Act, 21 U.S.C. section 360bbb-3(b)(1), unless the authorization is terminated or revoked sooner.    Influenza A by PCR NEGATIVE NEGATIVE Final   Influenza B by PCR NEGATIVE NEGATIVE Final    Comment: (NOTE) The Xpert Xpress SARS-CoV-2/FLU/RSV assay is intended as an aid in  the diagnosis of influenza from Nasopharyngeal swab specimens and  should not be used as a sole basis for treatment. Nasal washings and  aspirates are unacceptable for Xpert Xpress SARS-CoV-2/FLU/RSV  testing. Fact Sheet for Patients: PinkCheek.be Fact Sheet for Healthcare Providers: GravelBags.it This test is not yet approved or cleared by the [place] FDA  and  has been authorized for detection and/or diagnosis of SARS-CoV-2 by  FDA under an Emergency Use Authorization (EUA). This EUA will remain  in effect (meaning this test can be used) for the duration of the  Covid-19 declaration under Section 564(b)(1) of the Act, 21  U.S.C. section 360bbb-3(b)(1), unless the authorization is  terminated or revoked. Performed at [facility], [address] [place], [place] [address]   Blood culture (routine x 2)     Status: None   Collection Time: 02/18/20  6:29 AM   Specimen: BLOOD  Result Value Ref Range Status   Specimen Description BLOOD BLOOD LEFT HAND  Final   Special Requests   Final    BOTTLES DRAWN AEROBIC AND ANAEROBIC Blood Culture results may not  be optimal due to an excessive volume of blood received in culture bottles   Culture   Final    NO GROWTH 5 DAYS Performed at [facility], [address] [place], [place] [address]    Report Status 02/23/2020 FINAL  Final  Blood culture (routine x 2)     Status: None   Collection Time: 02/18/20  6:29 AM   Specimen: BLOOD  Result Value Ref Range Status   Specimen Description BLOOD LEFT ANTECUBITAL  Final   Special Requests   Final    BOTTLES DRAWN AEROBIC AND ANAEROBIC Blood Culture adequate volume   Culture   Final    NO GROWTH 5 DAYS Performed at [facility], [address] [place], [place] [address]    Report Status 02/23/2020 FINAL  Final  Urine culture     Status: None   Collection Time: 02/18/20 11:23 AM   Specimen: Urine, Random  Result Value Ref Range Status   Specimen Description   Final    URINE, RANDOM Performed at [facility], [address] [place], [place] [address]    Special Requests   Final    NONE Performed at [facility], [address], [place], [place] [address]    Culture   Final    NO GROWTH Performed at [facility], [place] [address] [place], [place] [address]    Report Status 02/19/2020  FINAL  Final     Studies: CARDIAC CATHETERIZATION  Result Date: 02/23/2020  Hemodynamic findings consistent with pulmonary hypertension.     Scheduled Meds: . budesonide (PULMICORT) nebulizer solution  0.5 mg Nebulization BID  . clopidogrel  37.5 mg Oral Daily  . feeding supplement (GLUCERNA SHAKE)  237 mL Oral TID BM  . ferrous sulfate  325 mg Oral Q breakfast  . gabapentin  100 mg Oral QHS  . heparin injection (subcutaneous)  5,000 Units Subcutaneous Q12H  . insulin aspart  0-5 Units Subcutaneous QHS  . insulin aspart  0-9 Units Subcutaneous TID WC  . insulin aspart  3 Units Subcutaneous TID WC  . insulin glargine  15 Units Subcutaneous Daily  . ipratropium-albuterol  3 mL Nebulization Q6H  . magic mouthwash  5 mL Oral TID  . melatonin  2.5 mg Oral QHS  . metoprolol tartrate  25 mg Oral BID  . multivitamin-lutein  1 capsule Oral BID  . polyethylene glycol  17 g Oral Daily  . rosuvastatin  5 mg Oral Once per day on Mon Wed Fri Sat  . sodium chloride flush  3 mL Intravenous Q12H  . torsemide  40 mg Oral Daily     Assessment/Plan:  1. Acute hypoxic respiratory failure.  Respiratory status has improved that she is at rest on room air.  Will check pulse ox with ambulation. 2. NSTEMI.  Patient on Plavix and metoprolol.  Cardiology planning on stress test on Monday. 3. COPD exacerbation.  Discontinue Solu-Medrol after today's dose since lungs sound better.  Continue nebulizer treatments 4. Acute diastolic congestive heart failure with worsening creatinine.  Cardiology did a right heart cath which shows normal right-sided pressures.  Patient switch to oral torsemide this morning 5. Acute kidney injury on chronic kidney disease stage IIIa.  Creatinine peaked at 2.88 and came down to 2.59.  Hope to see continued improvement 6. Hyponatremia secondary to congestive heart failure and chlorthalidone. Sodium still a bit low at 131. 7. Peripheral vascular disease on Plavix and  Crestor 8. Type 2  diabetes mellitus with chronic kidney disease stage IIIa.  I will cut back on the Lantus dose to 10 units since I am going to discontinue the steroids after today's dose. [place].  On nystatin swish and swallow 10. Bleeding right groin after right heart cath.  Injected with lidocaine and epinephrine by cardiology. 11. Atrial fibrillation rate controlled.  Hesitant on anticoagulation with diverticular bleed last month and bleeding right groin site.  Cardiology to follow.  Code Status:     Code Status Orders  (From admission, onward)         Start     Ordered   02/18/20 1100  Full code  Continuous     02/18/20 1059        Code Status History    Date Active Date Inactive Code Status Order ID Comments User Context   12/25/2019 0555 12/28/2019 2204 Full Code [id number]  Christel Mormon, MD ED   10/12/2018 1647 10/15/2018 1808 Full Code [id number]  Katha Cabal, MD Inpatient   09/16/2018 1254 09/16/2018 2048 Full Code [id number]  [name] Dolores Lory, MD Inpatient   08/11/2018 1706 08/13/2018 2136 Full Code [id number]  Gorden Harms, MD Inpatient   07/26/2018 0921 07/26/2018 1652 Full Code [id number]  [name], Dolores Lory, MD Inpatient   08/23/2017 0935 08/23/2017 1419 Full Code [id number]  Algernon Huxley, MD Inpatient   02/15/2017 1538 02/16/2017 1959 Full Code [id number]  Dustin Flock, MD Inpatient   Advance Care Planning Activity     Family Communication: Spoke the patient's daughter on the phone and given update. Disposition Plan: Cardiology would like to do a stress test on Monday.  Hopefully the patient's creatinine will continue to improve.  Trying to get the patient completely off oxygen today.  Earliest potential discharge home with home health would be Monday afternoon if stress test okay.  Consultants:  Cardiology  Nephrology  Time spent: 28 minutes  [place]

## 2020-02-25 DIAGNOSIS — I480 Paroxysmal atrial fibrillation: Secondary | ICD-10-CM

## 2020-02-25 DIAGNOSIS — Z8673 Personal history of transient ischemic attack (TIA), and cerebral infarction without residual deficits: Secondary | ICD-10-CM

## 2020-02-25 DIAGNOSIS — I4891 Unspecified atrial fibrillation: Secondary | ICD-10-CM

## 2020-02-25 LAB — BASIC METABOLIC PANEL
Anion gap: 8 (ref 5–15)
BUN: 120 mg/dL — ABNORMAL HIGH (ref 8–23)
CO2: 29 mmol/L (ref 22–32)
Calcium: 8.5 mg/dL — ABNORMAL LOW (ref 8.9–10.3)
Chloride: 97 mmol/L — ABNORMAL LOW (ref 98–111)
Creatinine, Ser: 2.42 mg/dL — ABNORMAL HIGH (ref 0.44–1.00)
GFR calc Af Amer: 21 mL/min — ABNORMAL LOW (ref >60)
GFR calc non Af Amer: 18 mL/min — ABNORMAL LOW (ref >60)
Glucose, Bld: 132 mg/dL — ABNORMAL HIGH (ref 70–99)
Potassium: 3.4 mmol/L — ABNORMAL LOW (ref 3.5–5.1)
Sodium: 134 mmol/L — ABNORMAL LOW (ref 135–145)

## 2020-02-25 LAB — GLUCOSE, CAPILLARY
Glucose-Capillary: 123 mg/dL — ABNORMAL HIGH (ref 70–99)
Glucose-Capillary: 305 mg/dL — ABNORMAL HIGH (ref 70–99)
Glucose-Capillary: 108 mg/dL — ABNORMAL HIGH (ref 70–99)
Glucose-Capillary: 78 mg/dL (ref 70–99)

## 2020-02-25 LAB — HEPARIN LEVEL (UNFRACTIONATED): Heparin Unfractionated: 0.9 IU/mL — ABNORMAL HIGH (ref 0.30–0.70)

## 2020-02-25 MED ORDER — IPRATROPIUM-ALBUTEROL 0.5-2.5 (3) MG/3ML IN SOLN
3.0000 mL | Freq: Two times a day (BID) | RESPIRATORY_TRACT | Status: DC
  Administered 2020-02-25: 3 mL via RESPIRATORY_TRACT
  Filled 2020-02-25 (×2): qty 3

## 2020-02-25 MED ORDER — HEPARIN (PORCINE) 25000 UT/250ML-% IV SOLN
500.0000 [IU]/h | INTRAVENOUS | Status: DC
  Administered 2020-02-25: 750 [IU]/h via INTRAVENOUS
  Filled 2020-02-25: qty 250

## 2020-02-25 NOTE — Progress Notes (Signed)
Progress Note  Patient Name: Jill Hudson Date of Encounter: 02/25/2020  Primary Cardiologist: No primary care provider on file. [facility]  Subjective   Feeling much better.  Denies chest pain or pressure  Inpatient Medications    Scheduled Meds: . budesonide (PULMICORT) nebulizer solution  0.5 mg Nebulization BID  . clopidogrel  37.5 mg Oral Daily  . feeding supplement (GLUCERNA SHAKE)  237 mL Oral TID BM  . ferrous sulfate  325 mg Oral Q breakfast  . gabapentin  100 mg Oral QHS  . heparin injection (subcutaneous)  5,000 Units Subcutaneous Q12H  . insulin aspart  0-5 Units Subcutaneous QHS  . insulin aspart  0-9 Units Subcutaneous TID WC  . insulin aspart  3 Units Subcutaneous TID WC  . insulin glargine  10 Units Subcutaneous Daily  . ipratropium-albuterol  3 mL Nebulization BID  . magic mouthwash  5 mL Oral TID  . melatonin  2.5 mg Oral QHS  . metoprolol tartrate  25 mg Oral BID  . multivitamin-lutein  1 capsule Oral BID  . polyethylene glycol  17 g Oral Daily  . rosuvastatin  5 mg Oral Once per day on Mon Wed Fri Sat  . sodium chloride flush  3 mL Intravenous Q12H  . torsemide  40 mg Oral Daily   Continuous Infusions:  PRN Meds: acetaminophen, albuterol, clonazepam, dextromethorphan-guaiFENesin, hydrALAZINE, morphine injection, nitroGLYCERIN, ondansetron (ZOFRAN) IV   Vital Signs    Vitals:   02/24/20 2034 02/25/20 0539 02/25/20 0751 02/25/20 1131  BP: 123/90 (!) 126/59 (!) 127/59 109/64  Pulse: (!) 106 96 86 74  Resp: 20 19 16 16   Temp: 97.7 F (36.5 C) 97.7 F (36.5 C) 97.7 F (36.5 C) 98 F (36.7 C)  TempSrc: Oral Oral Oral   SpO2: 97% 95% 100% 99%  Weight:  53.1 kg    Height:        Intake/Output Summary (Last 24 hours) at 02/25/2020 1158 Last data filed at 02/25/2020 1133 Gross per 24 hour  Intake 240 ml  Output 2351 ml  Net -2111 ml   Last 3 Weights 02/25/2020 02/24/2020 02/23/2020  Weight (lbs) 117 lb 117 lb 9.6 oz 125 lb  Weight (kg) 53.071 kg 53.343  kg 56.7 kg      Telemetry    Atrial fibrillation.  Rates <100 bpm.  PVCs - Personally Reviewed  ECG    n/a - Personally Reviewed  Physical Exam   VS:  BP 109/64 (BP Location: Right Arm)   Pulse 74   Temp 98 F (36.7 C)   Resp 16   Ht 5' (1.524 m)   Wt 53.1 kg   SpO2 99%   BMI 22.85 kg/m  , BMI Body mass index is 22.85 kg/m. GENERAL:  Well appearing HEENT: Pupils equal round and reactive, fundi not visualized, oral mucosa unremarkable NECK:  No jugular venous distention, waveform within normal limits, carotid upstroke brisk and symmetric, no bruits, no thyromegaly LYMPHATICS:  No cervical adenopathy LUNGS:  Clear to auscultation bilaterally HEART:  Irregularly irregular.  PMI not displaced or sustained,S1 and S2 within normal limits, no S3, no S4, no clicks, no rubs, no murmurs ABD:  Flat, positive bowel sounds normal in frequency in pitch, no bruits, no rebound, no guarding, no midline pulsatile mass, no hepatomegaly, no splenomegaly EXT:  2 plus pulses throughout, no edema, no cyanosis no clubbing SKIN:  No rashes no nodules NEURO:  Cranial nerves II through XII grossly intact, motor grossly intact throughout PSYCH:  Cognitively intact, oriented to person place and time   Labs    High Sensitivity Troponin:   Recent Labs  Lab 02/18/20 1051 02/18/20 1431 02/19/20 1525 02/20/20 0917 02/20/20 1121  TROPONINIHS 193* 202* 3,082* 1,409* 1,327*      Chemistry Recent Labs  Lab 02/21/20 1417 02/22/20 0442 02/23/20 0621 02/24/20 0459 02/25/20 0450  NA  --    < > 132* 131* 134*  K  --    < > 4.7 3.8 3.4*  CL  --    < > 97* 96* 97*  CO2  --    < > 21* 23 29  GLUCOSE  --    < > 214* 166* 132*  BUN  --    < > 116* 121* 120*  CREATININE  --    < > 2.88* 2.59* 2.42*  CALCIUM  --    < > 8.6* 8.3* 8.5*  PROT 6.1*  --   --   --   --   ALBUMIN 3.5  --   --   --   --   AST 33  --   --   --   --   ALT 50*  --   --   --   --   ALKPHOS 78  --   --   --   --   BILITOT  0.7  --   --   --   --   GFRNONAA  --    < > 14* 16* 18*  GFRAA  --    < > 17* 19* 21*  ANIONGAP  --    < > 14 12 8    < > = values in this interval not displayed.     Hematology Recent Labs  Lab 02/22/20 0442 02/23/20 0621 02/24/20 0459  WBC 15.5* 16.4* 19.2*  RBC 3.32* 3.14* 3.36*  HGB 9.8* 9.6* 10.0*  HCT 29.3* 27.7* 29.7*  MCV 88.3 88.2 88.4  MCH 29.5 30.6 29.8  MCHC 33.4 34.7 33.7  RDW 13.7 13.7 13.9  PLT 287 289 326    BNPNo results for input(s): BNP, PROBNP in the last 168 hours.   DDimer No results for input(s): DDIMER in the last 168 hours.   Radiology    CARDIAC CATHETERIZATION  Result Date: 02/23/2020  Hemodynamic findings consistent with pulmonary hypertension.     Cardiac Studies   2D echo 02/19/2020: 1. Left ventricular ejection fraction, by estimation, is 55 to 60%. The  left ventricle has normal function. The left ventricle has no regional  wall motion abnormalities. There is mild left ventricular hypertrophy.  Left ventricular diastolic parameters  are consistent with Grade II diastolic dysfunction (pseudonormalization).  2. Right ventricular systolic function is normal. The right ventricular  size is normal. There is moderately elevated pulmonary artery systolic  pressure.  3. Left atrial size was mildly dilated.  4. The mitral valve is myxomatous. Moderate mitral valve regurgitation.  No evidence of mitral stenosis.  5. The aortic valve is normal in structure. Aortic valve regurgitation is  not visualized. Mild aortic valve sclerosis is present, with no evidence  of aortic valve stenosis.  6. The inferior vena cava is dilated in size with >50% respiratory  variability, suggesting right atrial pressure of 8 mmHg.   [place] 02/23/2020: Right heart pressures RA mean of 3 RV 37/2 and 9 PA 37/11 mean 24 Wedge pressure mean 13 Cardiac output 3.11 Cardiac index 2.04  Final Conclusions:  High normal right heart pressures Mean PA  pressure  24, wedge 13, RA 3  Patient Profile     Ms. Kovacich is an 71F with CAD, prior GI bleed, hypertension, hyperlipidemia, stroke, DM admitted with acute hypoxic respiratory distress in the setting of acute on chronic diastolic heart failure, pulmonary hypertension, and COPD.  Assessment & Plan    # NSTEMI:  High-sensitivity troponin was elevated to 3000.  Systolic dysfunction preserved on echo this admission.  Prior cath in 1998 revealed 40% left main, 95% RI stenoses.  She completed 48 hours on a heparin drip.  Typically, she would undergo a left heart catheterization.  However given her renal dysfunction she will have a low Lexiscan Myoview tomorrow to rule out high risk ischemia.  Otherwise continue with medical management.  She is feeling well and has no chest pain.  Her breathing is stable.  # Acute hypoxic respiratry failure: # Acute on chronic diastolic heart failure: Occurred in the setting of COPD exacerbation and acute on chronic diastolic heart failure.  Right heart cath revealed normal pressures.  She is now off supplemental oxygen.  Continue torsemide  # PAD:  Continue clopidogrel and rosuvastatin.  # Paroxsymal atrial fibrillation:  New this degree, prolonged.  She is asymptomatic.  Given her history of GI bleeding and chronic anemia, would like to avoid her being on both clopidogrel and an anticoagulant.  Based on the findings of her stress test she may need to transition to Eliquis and discontinue the clopidogrel.  We will wait to change her antiplatelets/anticoagulants until we have more information.  Heparin was initially stopped because of oozing from her right groin cath site.  This has resolved.  We will switch from subcutaneous to IV heparin now.  CHA2DS2-VASc 9 (CHF, HTN, age x 2, DM, stroke x 2, vascular disease, gender).  # Hyponatremia: Occurred in the setting of acute on chronic diastolic heart failure and diuretic use.  Improving since holding chlorthalidone.    #  Hypertension: Continue metoprolol.        For questions or updates, please contact [place] Please consult [email or web address] for contact info under        Signed, Skeet Latch, MD  02/25/2020, 11:58 AM

## 2020-02-25 NOTE — Progress Notes (Signed)
Patient ID: Jill Hudson, female   DOB: 25-Jun-1935, 85 y.o.   MRN: [id number] [facility] PROGRESS NOTE  Jill Hudson QBH:[id number] DOB: 1935-09-17 DOA: 02/18/2020 PCP: [name], MD  HPI/Subjective: Patient feeling a little bit better every day.  Did better with physical therapy yesterday.  Still feeling tired only a slight cough.  Breathing better than when she came in.  Now completely off oxygen even with ambulation.  Objective: Vitals:   02/25/20 1131 02/25/20 1518  BP: 109/64 (!) 113/54  Pulse: 74 87  Resp: 16 16  Temp: 98 F (36.7 C) (!) 97.4 F (36.3 C)  SpO2: 99% 98%    Intake/Output Summary (Last 24 hours) at 02/25/2020 1528 Last data filed at 02/25/2020 1517 Gross per 24 hour  Intake 240 ml  Output 2650 ml  Net -2410 ml   Filed Weights   02/23/20 1302 02/24/20 0418 02/25/20 0539  Weight: 56.7 kg 53.3 kg 53.1 kg    ROS: Review of Systems  Constitutional: Positive for malaise/fatigue.  Eyes: Negative for blurred vision.  Respiratory: Positive for cough and shortness of breath.   Cardiovascular: Negative for chest pain.  Gastrointestinal: Negative for abdominal pain, nausea and vomiting.  Genitourinary: Negative for dysuria.  Musculoskeletal: Negative for joint pain.  Neurological: Negative for dizziness.   Exam: Physical Exam  Constitutional: She is oriented to person, place, and time.  HENT:  Nose: No mucosal edema.  Mouth/Throat: No oropharyngeal exudate or posterior oropharyngeal edema.  thrush in the mouth  Eyes: Conjunctivae and lids are normal.  Neck: Carotid bruit is not present.  Cardiovascular: S1 normal and S2 normal. Exam reveals no gallop.  No murmur heard. Respiratory: No respiratory distress. She has decreased breath sounds in the right lower field and the left lower field. She has no wheezes. She has no rhonchi. She has no rales.  GI: Soft. Bowel sounds are normal. There is no abdominal tenderness.  Musculoskeletal:     Right  ankle: No swelling.     Left ankle: No swelling.  Lymphadenopathy:    She has no cervical adenopathy.  Neurological: She is alert and oriented to person, place, and time. No cranial nerve deficit.  Skin: Skin is warm. No rash noted. Nails show no clubbing.  Psychiatric: She has a normal mood and affect.      Data Reviewed: Basic Metabolic Panel: Recent Labs  Lab 02/19/20 0506 02/20/20 0411 02/21/20 0545 02/22/20 0442 02/23/20 0621 02/24/20 0459 02/25/20 0450  NA 129*   < > 127* 129* 132* 131* 134*  K 5.1   < > 5.5* 4.7 4.7 3.8 3.4*  CL 98   < > 96* 96* 97* 96* 97*  CO2 20*   < > 18* 20* 21* 23 29  GLUCOSE 206*   < > 237* 156* 214* 166* 132*  BUN 57*   < > 93* 108* 116* 121* 120*  CREATININE 1.85*   < > 2.72* 2.74* 2.88* 2.59* 2.42*  CALCIUM 9.0   < > 9.0 9.0 8.6* 8.3* 8.5*  MG 2.1  --   --   --   --   --   --    < > = values in this interval not displayed.   Liver Function Tests: Recent Labs  Lab 02/21/20 1417  AST 33  ALT 50*  ALKPHOS 78  BILITOT 0.7  PROT 6.1*  ALBUMIN 3.5   CBC: Recent Labs  Lab 02/20/20 0973 02/21/20 0545 02/22/20 0442 02/23/20 5329 02/24/20 0459  WBC 25.2* 19.9* 15.5* 16.4* 19.2*  NEUTROABS 22.5* 16.9* 13.3*  --   --   HGB 9.3* 9.4* 9.8* 9.6* 10.0*  HCT 29.1* 29.7* 29.3* 27.7* 29.7*  MCV 92.4 91.7 88.3 88.2 88.4  PLT 339 302 287 289 326   BNP (last 3 results) Recent Labs    02/18/20 0517  BNP 600.0*    CBG: Recent Labs  Lab 02/24/20 1153 02/24/20 1631 02/24/20 2119 02/25/20 0750 02/25/20 1131  GLUCAP 168* 246* 331* 123* 305*    Recent Results (from the past 240 hour(s))  Respiratory Panel by RT PCR (Flu A&B, Covid) - Nasopharyngeal Swab     Status: None   Collection Time: 02/18/20  5:17 AM   Specimen: Nasopharyngeal Swab  Result Value Ref Range Status   SARS Coronavirus 2 by RT PCR NEGATIVE NEGATIVE Final    Comment: (NOTE) SARS-CoV-2 target nucleic acids are NOT DETECTED. The SARS-CoV-2 RNA is generally  detectable in upper respiratoy specimens during the acute phase of infection. The lowest concentration of SARS-CoV-2 viral copies this assay can detect is 131 copies/mL. A negative result does not preclude SARS-Cov-2 infection and should not be used as the sole basis for treatment or other patient management decisions. A negative result may occur with  improper specimen collection/handling, submission of specimen other than nasopharyngeal swab, presence of viral mutation(s) within the areas targeted by this assay, and inadequate number of viral copies (<131 copies/mL). A negative result must be combined with clinical observations, patient history, and epidemiological information. The expected result is Negative. Fact Sheet for Patients:  PinkCheek.be Fact Sheet for Healthcare Providers:  GravelBags.it This test is not yet ap proved or cleared by the [place] FDA and  has been authorized for detection and/or diagnosis of SARS-CoV-2 by FDA under an Emergency Use Authorization (EUA). This EUA will remain  in effect (meaning this test can be used) for the duration of the COVID-19 declaration under Section 564(b)(1) of the Act, 21 U.S.C. section 360bbb-3(b)(1), unless the authorization is terminated or revoked sooner.    Influenza A by PCR NEGATIVE NEGATIVE Final   Influenza B by PCR NEGATIVE NEGATIVE Final    Comment: (NOTE) The Xpert Xpress SARS-CoV-2/FLU/RSV assay is intended as an aid in  the diagnosis of influenza from Nasopharyngeal swab specimens and  should not be used as a sole basis for treatment. Nasal washings and  aspirates are unacceptable for Xpert Xpress SARS-CoV-2/FLU/RSV  testing. Fact Sheet for Patients: PinkCheek.be Fact Sheet for Healthcare Providers: GravelBags.it This test is not yet approved or cleared by the [place] FDA and  has been  authorized for detection and/or diagnosis of SARS-CoV-2 by  FDA under an Emergency Use Authorization (EUA). This EUA will remain  in effect (meaning this test can be used) for the duration of the  Covid-19 declaration under Section 564(b)(1) of the Act, 21  U.S.C. section 360bbb-3(b)(1), unless the authorization is  terminated or revoked. Performed at [facility], [address] [place], [place] [address]   Blood culture (routine x 2)     Status: None   Collection Time: 02/18/20  6:29 AM   Specimen: BLOOD  Result Value Ref Range Status   Specimen Description BLOOD BLOOD LEFT HAND  Final   Special Requests   Final    BOTTLES DRAWN AEROBIC AND ANAEROBIC Blood Culture results may not be optimal due to an excessive volume of blood received in culture bottles   Culture   Final    NO  GROWTH 5 DAYS Performed at [facility], [address] [place], [place] [address]    Report Status 02/23/2020 FINAL  Final  Blood culture (routine x 2)     Status: None   Collection Time: 02/18/20  6:29 AM   Specimen: BLOOD  Result Value Ref Range Status   Specimen Description BLOOD LEFT ANTECUBITAL  Final   Special Requests   Final    BOTTLES DRAWN AEROBIC AND ANAEROBIC Blood Culture adequate volume   Culture   Final    NO GROWTH 5 DAYS Performed at [facility], [address] [place], [place] [address]    Report Status 02/23/2020 FINAL  Final  Urine culture     Status: None   Collection Time: 02/18/20 11:23 AM   Specimen: Urine, Random  Result Value Ref Range Status   Specimen Description   Final    URINE, RANDOM Performed at [facility], [address], [place], [place] [address]    Special Requests   Final    NONE Performed at [facility], [address] [place], [place] [address]    Culture   Final    NO GROWTH Performed at [facility], [place] [address], [place], [place] [address]    Report Status 02/19/2020 FINAL  Final      Studies: No results found.  Scheduled Meds: . budesonide (PULMICORT) nebulizer solution  0.5 mg Nebulization BID  . clopidogrel  37.5 mg Oral Daily  . feeding supplement (GLUCERNA SHAKE)  237 mL Oral TID BM  . ferrous sulfate  325 mg Oral Q breakfast  . gabapentin  100 mg Oral QHS  . insulin aspart  0-5 Units Subcutaneous QHS  . insulin aspart  0-9 Units Subcutaneous TID WC  . insulin aspart  3 Units Subcutaneous TID WC  . insulin glargine  10 Units Subcutaneous Daily  . ipratropium-albuterol  3 mL Nebulization BID  . magic mouthwash  5 mL Oral TID  . melatonin  2.5 mg Oral QHS  . metoprolol tartrate  25 mg Oral BID  . multivitamin-lutein  1 capsule Oral BID  . polyethylene glycol  17 g Oral Daily  . rosuvastatin  5 mg Oral Once per day on Mon Wed Fri Sat  . sodium chloride flush  3 mL Intravenous Q12H  . torsemide  40 mg Oral Daily   . heparin      Assessment/Plan:  1. Acute hypoxic respiratory failure.  Respiratory status has improved and is off oxygen. 2. NSTEMI.  Patient on Plavix, metoprolol, Crestor.  Cardiology ordered a stress test for Monday.  LDL 54. 3. COPD exacerbation.  Improved.  Finish steroid course yesterday.  Continue nebulizers. 4. Acute diastolic congestive heart failure with worsening creatinine.  Cardiology did a right heart cath which shows normal right-sided pressures.  Continue oral torsemide, metoprolol. 5. Acute kidney injury on chronic kidney disease stage IIIa.  Creatinine peaked at 2.88 and came down to 2.42.  Will need nephrology follow-up as outpatient 6. Hyponatremia secondary to congestive heart failure and chlorthalidone. Sodium improved at 134. 7. Peripheral vascular disease on Plavix and Crestor 8. Type 2 diabetes mellitus with chronic kidney disease stage IIIa.  I will cut back on the Lantus dose to 10 units since I am going to discontinue the steroids after today's dose. [place].  On nystatin swish and swallow 10. Bleeding right groin  after right heart cath.  Injected with lidocaine and epinephrine by cardiology yesterday. 11. Paroxysmal  atrial fibrillation rate controlled.  Hesitant on anticoagulation with diverticular bleed last month.  Cardiology to follow.  Code Status:     Code Status Orders  (From admission, onward)         Start     Ordered   02/18/20 1100  Full code  Continuous     02/18/20 1059        Code Status History    Date Active Date Inactive Code Status Order ID Comments User Context   12/25/2019 0555 12/28/2019 2204 Full Code [id number]  Christel Mormon, MD ED   10/12/2018 1647 10/15/2018 1808 Full Code [id number]  [name], Dolores Lory, MD Inpatient   09/16/2018 1254 09/16/2018 2048 Full Code [id number]  [name] Dolores Lory, MD Inpatient   08/11/2018 1706 08/13/2018 2136 Full Code [id number]  Gorden Harms, MD Inpatient   07/26/2018 0921 07/26/2018 1652 Full Code [id number]  [name], Dolores Lory, MD Inpatient   08/23/2017 0935 08/23/2017 1419 Full Code [id number]  Algernon Huxley, MD Inpatient   02/15/2017 1538 02/16/2017 1959 Full Code [id number]  Dustin Flock, MD Inpatient   Advance Care Planning Activity     Family Communication: Spoke the patient's daughter at the bedside Disposition Plan: Cardiology would like to do a stress test on Monday.  Hopefully the patient's creatinine will continue to improve.  Earliest potential disposition home with home health will be Monday afternoon after stress test if cardiology does not want to do anything further.  Consultants:  Cardiology  Nephrology  Time spent: 27 minutes, case discussed with Dr. Candiss Norse nephrology  Delfino Lovett [facility]  [facility]

## 2020-02-25 NOTE — Progress Notes (Signed)
[facility]  ROUNDING NOTE   Subjective:   Patient states she is breathing better.  Lying comfortably in the bed No acute complaints States she is able to eat a little better although appetite is low Daughter at bedside  Objective:  Vital signs in last 24 hours:  Temp:  [97.7 F (36.5 C)] 97.7 F (36.5 C) (05/02 0751) Pulse Rate:  [86-106] 86 (05/02 0751) Resp:  [16-20] 16 (05/02 0751) BP: (114-127)/(53-90) 127/59 (05/02 0751) SpO2:  [95 %-100 %] 100 % (05/02 0751) Weight:  [53.1 kg] 53.1 kg (05/02 0539)  Weight change: -3.629 kg Filed Weights   02/23/20 1302 02/24/20 0418 02/25/20 0539  Weight: 56.7 kg 53.3 kg 53.1 kg    Intake/Output: I/O last 3 completed shifts: In: 0  Out: 3501 [Urine:3500; Stool:1]   Intake/Output this shift:  Total I/O In: 240 [P.O.:240] Out: 350 [Urine:350]  Physical Exam: General: NAD,   HEENT Anicteric, dry mouth  Neck: Supple,   Lungs:   Mild basilar rhonchi otherwise clear  Heart: Regular rate and rhythm  Abdomen:  Soft, nontender,   Extremities:  trace peripheral edema.  Neurologic: Nonfocal, moving all four extremities  Skin: No lesions        Basic Metabolic Panel: Recent Labs  Lab 02/19/20 0506 02/20/20 0411 02/21/20 0545 02/21/20 0545 02/22/20 0442 02/22/20 0442 02/23/20 0621 02/24/20 0459 02/25/20 0450  NA 129*   < > 127*  --  129*  --  132* 131* 134*  K 5.1   < > 5.5*  --  4.7  --  4.7 3.8 3.4*  CL 98   < > 96*  --  96*  --  97* 96* 97*  CO2 20*   < > 18*  --  20*  --  21* 23 29  GLUCOSE 206*   < > 237*  --  156*  --  214* 166* 132*  BUN 57*   < > 93*  --  108*  --  116* 121* 120*  CREATININE 1.85*   < > 2.72*  --  2.74*  --  2.88* 2.59* 2.42*  CALCIUM 9.0   < > 9.0   < > 9.0   < > 8.6* 8.3* 8.5*  MG 2.1  --   --   --   --   --   --   --   --    < > = values in this interval not displayed.    Liver Function Tests: Recent Labs  Lab 02/21/20 1417  AST 33  ALT 50*  ALKPHOS 78  BILITOT 0.7   PROT 6.1*  ALBUMIN 3.5   No results for input(s): LIPASE, AMYLASE in the last 168 hours. No results for input(s): AMMONIA in the last 168 hours.  CBC: Recent Labs  Lab 02/20/20 0917 02/21/20 0545 02/22/20 0442 02/23/20 0621 02/24/20 0459  WBC 25.2* 19.9* 15.5* 16.4* 19.2*  NEUTROABS 22.5* 16.9* 13.3*  --   --   HGB 9.3* 9.4* 9.8* 9.6* 10.0*  HCT 29.1* 29.7* 29.3* 27.7* 29.7*  MCV 92.4 91.7 88.3 88.2 88.4  PLT 339 302 287 289 326    Cardiac Enzymes: No results for input(s): CKTOTAL, CKMB, CKMBINDEX, TROPONINI in the last 168 hours.  BNP: Invalid input(s): POCBNP  CBG: Recent Labs  Lab 02/24/20 0804 02/24/20 1153 02/24/20 1631 02/24/20 2119 02/25/20 0750  GLUCAP 156* 168* 246* 331* 123*    Microbiology: Results for orders placed or performed during the hospital encounter of 02/18/20  Respiratory Panel by RT PCR (Flu A&B, Covid) - Nasopharyngeal Swab     Status: None   Collection Time: 02/18/20  5:17 AM   Specimen: Nasopharyngeal Swab  Result Value Ref Range Status   SARS Coronavirus 2 by RT PCR NEGATIVE NEGATIVE Final    Comment: (NOTE) SARS-CoV-2 target nucleic acids are NOT DETECTED. The SARS-CoV-2 RNA is generally detectable in upper respiratoy specimens during the acute phase of infection. The lowest concentration of SARS-CoV-2 viral copies this assay can detect is 131 copies/mL. A negative result does not preclude SARS-Cov-2 infection and should not be used as the sole basis for treatment or other patient management decisions. A negative result may occur with  improper specimen collection/handling, submission of specimen other than nasopharyngeal swab, presence of viral mutation(s) within the areas targeted by this assay, and inadequate number of viral copies (<131 copies/mL). A negative result must be combined with clinical observations, patient history, and epidemiological information. The expected result is Negative. Fact Sheet for Patients:   PinkCheek.be Fact Sheet for Healthcare Providers:  GravelBags.it This test is not yet ap proved or cleared by the [place] FDA and  has been authorized for detection and/or diagnosis of SARS-CoV-2 by FDA under an Emergency Use Authorization (EUA). This EUA will remain  in effect (meaning this test can be used) for the duration of the COVID-19 declaration under Section 564(b)(1) of the Act, 21 U.S.C. section 360bbb-3(b)(1), unless the authorization is terminated or revoked sooner.    Influenza A by PCR NEGATIVE NEGATIVE Final   Influenza B by PCR NEGATIVE NEGATIVE Final    Comment: (NOTE) The Xpert Xpress SARS-CoV-2/FLU/RSV assay is intended as an aid in  the diagnosis of influenza from Nasopharyngeal swab specimens and  should not be used as a sole basis for treatment. Nasal washings and  aspirates are unacceptable for Xpert Xpress SARS-CoV-2/FLU/RSV  testing. Fact Sheet for Patients: PinkCheek.be Fact Sheet for Healthcare Providers: GravelBags.it This test is not yet approved or cleared by the [place] FDA and  has been authorized for detection and/or diagnosis of SARS-CoV-2 by  FDA under an Emergency Use Authorization (EUA). This EUA will remain  in effect (meaning this test can be used) for the duration of the  Covid-19 declaration under Section 564(b)(1) of the Act, 21  U.S.C. section 360bbb-3(b)(1), unless the authorization is  terminated or revoked. Performed at [facility], [address] [place], [place] [address]   Blood culture (routine x 2)     Status: None   Collection Time: 02/18/20  6:29 AM   Specimen: BLOOD  Result Value Ref Range Status   Specimen Description BLOOD BLOOD LEFT HAND  Final   Special Requests   Final    BOTTLES DRAWN AEROBIC AND ANAEROBIC Blood Culture results may not be optimal due to an excessive volume of  blood received in culture bottles   Culture   Final    NO GROWTH 5 DAYS Performed at [facility], [address] [place], [place] [address]    Report Status 02/23/2020 FINAL  Final  Blood culture (routine x 2)     Status: None   Collection Time: 02/18/20  6:29 AM   Specimen: BLOOD  Result Value Ref Range Status   Specimen Description BLOOD LEFT ANTECUBITAL  Final   Special Requests   Final    BOTTLES DRAWN AEROBIC AND ANAEROBIC Blood Culture adequate volume   Culture   Final    NO GROWTH 5 DAYS Performed at  [facility], [address], [place], [place] [address]    Report Status 02/23/2020 FINAL  Final  Urine culture     Status: None   Collection Time: 02/18/20 11:23 AM   Specimen: Urine, Random  Result Value Ref Range Status   Specimen Description   Final    URINE, RANDOM Performed at [facility], [address] [place], [place] [address]    Special Requests   Final    NONE Performed at [facility], [address] [place], [place] [address]    Culture   Final    NO GROWTH Performed at [facility], [place] [address], [place], [place] [address]    Report Status 02/19/2020 FINAL  Final    Coagulation Studies: No results for input(s): LABPROT, INR in the last 72 hours.  Urinalysis: No results for input(s): COLORURINE, LABSPEC, PHURINE, GLUCOSEU, HGBUR, BILIRUBINUR, KETONESUR, PROTEINUR, UROBILINOGEN, NITRITE, LEUKOCYTESUR in the last 72 hours.  Invalid input(s): APPERANCEUR    Imaging: CARDIAC CATHETERIZATION  Result Date: 02/23/2020  Hemodynamic findings consistent with pulmonary hypertension.      Medications:    . budesonide (PULMICORT) nebulizer solution  0.5 mg Nebulization BID  . clopidogrel  37.5 mg Oral Daily  . feeding supplement (GLUCERNA SHAKE)  237 mL Oral TID BM  . ferrous sulfate  325 mg Oral Q breakfast  . gabapentin  100 mg Oral QHS  . heparin injection (subcutaneous)  5,000 Units  Subcutaneous Q12H  . insulin aspart  0-5 Units Subcutaneous QHS  . insulin aspart  0-9 Units Subcutaneous TID WC  . insulin aspart  3 Units Subcutaneous TID WC  . insulin glargine  10 Units Subcutaneous Daily  . ipratropium-albuterol  3 mL Nebulization BID  . magic mouthwash  5 mL Oral TID  . melatonin  2.5 mg Oral QHS  . metoprolol tartrate  25 mg Oral BID  . multivitamin-lutein  1 capsule Oral BID  . polyethylene glycol  17 g Oral Daily  . rosuvastatin  5 mg Oral Once per day on Mon Wed Fri Sat  . sodium chloride flush  3 mL Intravenous Q12H  . torsemide  40 mg Oral Daily   acetaminophen, albuterol, clonazepam, dextromethorphan-guaiFENesin, hydrALAZINE, morphine injection, nitroGLYCERIN, ondansetron (ZOFRAN) IV  Assessment/ Plan:  Ms. DEASIAH HAGBERG is a 85 y.o. white female with hypertension, peripheral vascular disease, CVA/TIA, coronary artery disease, diabetes mellitus type II , who was admitted to [facility] on 02/18/2020 for Hyperglycemia [R73.9] Acute CHF (congestive heart failure) ([place]) [I50.9] Acute respiratory failure with hypoxia ([place]) [J96.01] Community acquired pneumonia, unspecified laterality [J18.9] Acute on chronic congestive heart failure, unspecified heart failure type ([place]) [I50.9]   1. Acute renal failure with hyperkalemia and metabolic acidosis: on chronic kidney disease stage IIIA with proteinuria.  Baseline creatinine of 1.1, GFR of 47 on 12/27/2019.  Chronic kidney disease secondary to hypertension and diabetes.  Acute renal failure most likely secondary to acute cardio-renal syndrome.  No IV contrast exposure. No obstruction on ultrasound.    Lab Results  Component Value Date   CREATININE 2.42 (H) 02/25/2020   CREATININE 2.59 (H) 02/24/2020   CREATININE 2.88 (H) 02/23/2020   05/01 0701 - 05/02 0700 In: 0  Out: 2501 [Urine:2500; Stool:1] Serum creatinine is continuing to improve slowly We will follow-up outpatient  2. Hypertension: with acute exacerbation  of diastolic congestive heart failure.  -Agree with oral torsemide  3. Hyponatremia: with volume overload and chlorthalidone.  -Discontinue chlorthalidone.  LOS: [place] 5/2/202110:10 AM

## 2020-02-25 NOTE — Consult Note (Signed)
ANTICOAGULATION CONSULT NOTE - Initial Consult  Pharmacy Consult for Heparin Drip Indication: atrial fibrillation  Allergies  Allergen Reactions  . Saxagliptin Diarrhea  . Epinephrine Other (See Comments)    Patient does not remember what happens when she uses this  . Atorvastatin Other (See Comments)    Muscle aches  . Codeine Other (See Comments)    Upset stomach  . Ezetimibe Other (See Comments)    Myalgias(ZETIA)  . Limonene Rash    Patient does not recall this reaction  . Nitrofurantoin Rash and Other (See Comments)    Pruitus  . Sulfa Antibiotics Rash and Other (See Comments)    Sore mouth     Patient Measurements: Height: 5' (152.4 cm) Weight: 53.1 kg (117 lb) IBW/kg (Calculated) : 45.5 Heparin Dosing Weight: 53.1kg  Vital Signs: Temp: 98 F (36.7 C) (05/02 1131) Temp Source: Oral (05/02 0751) BP: 109/64 (05/02 1131) Pulse Rate: 74 (05/02 1131)  Labs: Recent Labs    02/23/20 0621 02/24/20 0459 02/25/20 0450  HGB 9.6* 10.0*  --   HCT 27.7* 29.7*  --   PLT 289 326  --   HEPARINUNFRC 0.36  --   --   CREATININE 2.88* 2.59* 2.42*    Estimated Creatinine Clearance: 12.7 mL/min (A) (by C-G formula based on SCr of 2.42 mg/dL (H)).   Medical History: Past Medical History:  Diagnosis Date  . Allergies   . Anxiety   . Arthritis    spine and shoulder  . Atherosclerosis of artery of extremity with rest pain ([place]) 10/12/2018  . Cancer ([place])    skin  . Diabetes mellitus without complication ([place])   . Heart murmur   . Hyperlipidemia   . Hypertension   . Macula lutea degeneration   . Mitral and aortic valve disease   . Myocardial infarction [facility])    may have had a "light" heart attack  . Occasional tremors   . PAD (peripheral artery disease) ([place])   . Shingles    patient unaware but daughter confirms. it was a long time ago  . Stroke [facility]) 01/2017   may have had a slight stroke  . TIA (transient ischemic attack) 01/2017  . UTI (urinary tract  infection)   . Vascular disease, peripheral (H[facility])     Medications:  Pt on half dose 37.5mg  plavix daily (bleed history) prior to admission - last dose 5/2 @ 0932 - now being held  No DOAC's of note for a fib prior to admission  Pt last SQ heparin dose 5000 units 5/2 @ 0933  Assessment: 85 yo female with a fib and hx of GIB - pharmacy has been consulted to initiate and monitor a heparin drip for a fib.  Goal of Therapy:  Heparin level 0.3-0.7 units/ml Monitor platelets by anticoagulation protocol: Yes   Plan:  Will forgo bolus given recent administration of SQH and clopidogrel with hx of GIB - will start drip @ 750 units/hr and check HL in 8 hours per protocol.  CBC's daily while on heparin drip.  Lu Duffel, PharmD, BCPS Clinical Pharmacist 02/25/2020 12:25 PM

## 2020-02-25 NOTE — Consult Note (Signed)
ANTICOAGULATION CONSULT NOTE - Initial Consult  Pharmacy Consult for Heparin Drip Indication: atrial fibrillation  Allergies  Allergen Reactions  . Saxagliptin Diarrhea  . Epinephrine Other (See Comments)    Patient does not remember what happens when she uses this  . Atorvastatin Other (See Comments)    Muscle aches  . Codeine Other (See Comments)    Upset stomach  . Ezetimibe Other (See Comments)    Myalgias(ZETIA)  . Limonene Rash    Patient does not recall this reaction  . Nitrofurantoin Rash and Other (See Comments)    Pruitus  . Sulfa Antibiotics Rash and Other (See Comments)    Sore mouth     Patient Measurements: Height: 5' (152.4 cm) Weight: 53.1 kg (117 lb) IBW/kg (Calculated) : 45.5 Heparin Dosing Weight: 53.1kg  Vital Signs: Temp: 98.2 F (36.8 C) (05/02 1935) Temp Source: Oral (05/02 1935) BP: 104/76 (05/02 1935) Pulse Rate: 75 (05/02 1935)  Labs: Recent Labs    02/23/20 0621 02/24/20 0459 02/25/20 0450 02/25/20 2052  HGB 9.6* 10.0*  --   --   HCT 27.7* 29.7*  --   --   PLT 289 326  --   --   HEPARINUNFRC 0.36  --   --  0.90*  CREATININE 2.88* 2.59* 2.42*  --     Estimated Creatinine Clearance: 12.7 mL/min (A) (by C-G formula based on SCr of 2.42 mg/dL (H)).   Medical History: Past Medical History:  Diagnosis Date  . Allergies   . Anxiety   . Arthritis    spine and shoulder  . Atherosclerosis of artery of extremity with rest pain ([place]) 10/12/2018  . Cancer ([place])    skin  . Diabetes mellitus without complication ([place])   . Heart murmur   . Hyperlipidemia   . Hypertension   . Macula lutea degeneration   . Mitral and aortic valve disease   . Myocardial infarction [facility])    may have had a "light" heart attack  . Occasional tremors   . PAD (peripheral artery disease) ([place])   . Shingles    patient unaware but daughter confirms. it was a long time ago  . Stroke [facility]) 01/2017   may have had a slight stroke  . TIA (transient ischemic  attack) 01/2017  . UTI (urinary tract infection)   . Vascular disease, peripheral (HCC)     Medications:  Pt on half dose 37.5mg  plavix daily (bleed history) prior to admission - last dose 5/2 @ 0932 - now being held  No DOAC's of note for a fib prior to admission  Pt last SQ heparin dose 5000 units 5/2 @ 0933  Assessment: 85 yo female with a fib and hx of GIB - pharmacy has been consulted to initiate and monitor a heparin drip for a fib.  Goal of Therapy:  Heparin level 0.3-0.7 units/ml Monitor platelets by anticoagulation protocol: Yes   Plan:  Will forgo bolus given recent administration of SQH and clopidogrel with hx of GIB - will start drip @ 750 units/hr and check HL in 8 hours per protocol.  CBC's daily while on heparin drip.   Update:  05/02 @ 2110 HL 0.90, supratherapeutic. Confirmed no bleeding concerns with floor nurse. Will decrease heparin infusion rate to 600 units/hr - nursing informed of these changes. Will check HL in 6 hours s/p rate change. Will check CBC with AM labs.   Rowland Lathe, PharmD Clinical Pharmacist 02/25/2020 9:17 PM

## 2020-02-26 ENCOUNTER — Inpatient Hospital Stay (HOSPITAL_COMMUNITY): Payer: Medicare Other

## 2020-02-26 DIAGNOSIS — I214 Non-ST elevation (NSTEMI) myocardial infarction: Secondary | ICD-10-CM

## 2020-02-26 LAB — HEPARIN LEVEL (UNFRACTIONATED): Heparin Unfractionated: 1.09 IU/mL — ABNORMAL HIGH (ref 0.30–0.70)

## 2020-02-26 LAB — BASIC METABOLIC PANEL
Anion gap: 11 (ref 5–15)
BUN: 116 mg/dL — ABNORMAL HIGH (ref 8–23)
CO2: 27 mmol/L (ref 22–32)
Calcium: 8.3 mg/dL — ABNORMAL LOW (ref 8.9–10.3)
Chloride: 95 mmol/L — ABNORMAL LOW (ref 98–111)
Creatinine, Ser: 2.4 mg/dL — ABNORMAL HIGH (ref 0.44–1.00)
GFR calc Af Amer: 21 mL/min — ABNORMAL LOW (ref >60)
GFR calc non Af Amer: 18 mL/min — ABNORMAL LOW (ref >60)
Glucose, Bld: 145 mg/dL — ABNORMAL HIGH (ref 70–99)
Potassium: 3.3 mmol/L — ABNORMAL LOW (ref 3.5–5.1)
Sodium: 133 mmol/L — ABNORMAL LOW (ref 135–145)

## 2020-02-26 LAB — CBC
HCT: 32 % — ABNORMAL LOW (ref 36.0–46.0)
Hemoglobin: 10.7 g/dL — ABNORMAL LOW (ref 12.0–15.0)
MCH: 30 pg (ref 26.0–34.0)
MCHC: 33.4 g/dL (ref 30.0–36.0)
MCV: 89.6 fL (ref 80.0–100.0)
Platelets: 305 10*3/uL (ref 150–400)
RBC: 3.57 MIL/uL — ABNORMAL LOW (ref 3.87–5.11)
RDW: 14.2 % (ref 11.5–15.5)
WBC: 16.3 10*3/uL — ABNORMAL HIGH (ref 4.0–10.5)
nRBC: 0.3 % — ABNORMAL HIGH (ref 0.0–0.2)

## 2020-02-26 LAB — NM MYOCAR MULTI W/SPECT W/WALL MOTION / EF
LV dias vol: 29 mL (ref 46–106)
LV sys vol: 13 mL
Peak HR: 117 {beats}/min
Percent HR: 85 %
Rest HR: 93 {beats}/min
SDS: 1
SRS: 4
SSS: 1
TID: 0.97

## 2020-02-26 LAB — GLUCOSE, CAPILLARY
Glucose-Capillary: 153 mg/dL — ABNORMAL HIGH (ref 70–99)
Glucose-Capillary: 243 mg/dL — ABNORMAL HIGH (ref 70–99)

## 2020-02-26 MED ORDER — TECHNETIUM TC 99M TETROFOSMIN IV KIT
10.0000 | PACK | Freq: Once | INTRAVENOUS | Status: AC | PRN
  Administered 2020-02-26: 10.09 via INTRAVENOUS

## 2020-02-26 MED ORDER — MELATONIN 5 MG PO TABS: 2.5000 mg | ORAL_TABLET | Freq: Every evening | ORAL | 0 refills | Status: DC | PRN

## 2020-02-26 MED ORDER — REGADENOSON 0.4 MG/5ML IV SOLN
0.4000 mg | Freq: Once | INTRAVENOUS | Status: AC
  Administered 2020-02-26: 0.4 mg via INTRAVENOUS
  Filled 2020-02-26: qty 5

## 2020-02-26 MED ORDER — GABAPENTIN 100 MG PO CAPS: 100.0000 mg | ORAL_CAPSULE | Freq: Every day | ORAL | Status: DC

## 2020-02-26 MED ORDER — POLYETHYLENE GLYCOL 3350 17 G PO PACK: 17.0000 g | PACK | Freq: Every day | ORAL | 0 refills | Status: DC | PRN

## 2020-02-26 MED ORDER — BACLOFEN 10 MG PO TABS: 10.0000 mg | ORAL_TABLET | Freq: Every day | ORAL | Status: DC | PRN

## 2020-02-26 MED ORDER — TECHNETIUM TC 99M TETROFOSMIN IV KIT
32.2800 | PACK | Freq: Once | INTRAVENOUS | Status: AC | PRN
  Administered 2020-02-26: 32.28 via INTRAVENOUS

## 2020-02-26 MED ORDER — ALBUTEROL SULFATE HFA 108 (90 BASE) MCG/ACT IN AERS
2.0000 | INHALATION_SPRAY | Freq: Four times a day (QID) | RESPIRATORY_TRACT | 0 refills | Status: DC | PRN
Start: 2020-02-26 — End: 2022-02-23

## 2020-02-26 MED ORDER — POTASSIUM CHLORIDE CRYS ER 20 MEQ PO TBCR
20.0000 meq | EXTENDED_RELEASE_TABLET | Freq: Once | ORAL | Status: AC
  Administered 2020-02-26: 20 meq via ORAL
  Filled 2020-02-26: qty 1

## 2020-02-26 MED ORDER — METOPROLOL TARTRATE 25 MG PO TABS: 25.0000 mg | ORAL_TABLET | Freq: Two times a day (BID) | ORAL | 0 refills | Status: DC

## 2020-02-26 MED ORDER — TIOTROPIUM BROMIDE-OLODATEROL 2.5-2.5 MCG/ACT IN AERS
2.0000 | INHALATION_SPRAY | Freq: Every day | RESPIRATORY_TRACT | 0 refills | Status: DC
Start: 2020-02-26 — End: 2020-08-07

## 2020-02-26 MED ORDER — TORSEMIDE 20 MG PO TABS: 40.0000 mg | ORAL_TABLET | Freq: Every day | ORAL | 0 refills | Status: DC

## 2020-02-26 MED ORDER — APIXABAN 2.5 MG PO TABS
2.5000 mg | ORAL_TABLET | Freq: Two times a day (BID) | ORAL | 0 refills | Status: DC
Start: 2020-02-26 — End: 2020-05-02

## 2020-02-26 NOTE — Plan of Care (Signed)
  Problem: Health Behavior/Discharge Planning: Goal: Ability to manage health-related needs will improve Outcome: Adequate for Discharge   Problem: Clinical Measurements: Goal: Ability to maintain clinical measurements within normal limits will improve Outcome: Adequate for Discharge   

## 2020-02-26 NOTE — Discharge Summary (Signed)
[facility] NAME: Jill Hudson    MR#:  [id number]  DATE OF BIRTH:  1935/07/16  DATE OF ADMISSION:  02/18/2020 ADMITTING PHYSICIAN: Ivor Costa, MD  DATE OF DISCHARGE: 02/26/2020  PRIMARY CARE PHYSICIAN: [name], MD    ADMISSION DIAGNOSIS:  Hyperglycemia [R73.9] Acute CHF (congestive heart failure) ([place]) [I50.9] Acute respiratory failure with hypoxia ([place]) [J96.01] Community acquired pneumonia, unspecified laterality [J18.9] Acute on chronic congestive heart failure, unspecified heart failure type ([place]) [I50.9]  DISCHARGE DIAGNOSIS:  Principal Problem:   Acute respiratory failure with hypoxia (HCC) Active Problems:   Essential hypertension   PVD (peripheral vascular disease) (HCC)   Type II diabetes mellitus with renal manifestations ([place])   History of CVA (cerebrovascular accident)   Acute kidney injury superimposed on CKD ([place])   CKD (chronic kidney disease), stage IIIa   Elevated troponin   Hyponatremia   Acute CHF (congestive heart failure) (HCC)   Elevated lactic acid level   Non-ST elevation (NSTEMI) myocardial infarction (HCC)   Acute diastolic CHF (congestive heart failure) (HCC)   COPD with acute exacerbation (HCC)   Thrush   AF (paroxysmal atrial fibrillation) ([place])   SECONDARY DIAGNOSIS:   Past Medical History:  Diagnosis Date  . Allergies   . Anxiety   . Arthritis    spine and shoulder  . Atherosclerosis of artery of extremity with rest pain ([place]) 10/12/2018  . Cancer ([place])    skin  . Diabetes mellitus without complication ([place])   . Heart murmur   . Hyperlipidemia   . Hypertension   . Macula lutea degeneration   . Mitral and aortic valve disease   . Myocardial infarction [facility])    may have had a "light" heart attack  . Occasional tremors   . PAD (peripheral artery disease) ([place])   . Shingles    patient unaware but daughter confirms. it was a long time ago  . Stroke [facility]) 01/2017   may  have had a slight stroke  . TIA (transient ischemic attack) 01/2017  . UTI (urinary tract infection)   . Vascular disease, peripheral ([place])     HOSPITAL COURSE:   1.  Acute hypoxic respiratory failure.  The patient required oxygen during most of the hospital stay and patient improved throughout the stay and is now breathing comfortably off oxygen. 2.  NSTEMI.  The patient had elevated cardiac enzymes peaking above 3000.  Conservative management was done secondary to patient having acute kidney injury.  Her LDL was 54.  Patient had a stress test on the day of discharge that was a low risk scan.  Cardiology recommended switching to Plavix over to low-dose Eliquis.  Patient is on metoprolol and Crestor. 3.  COPD exacerbation.  This has improved.  The patient finished a steroid course here in the hospital and given nebulizer treatments.  I will give albuterol inhaler and tiotropium bromide olodaterol inhaler. 4.  Acute diastolic congestive heart failure with worsening creatinine.  Cardiology did a right heart cath which showed normal right-sided pressures.  The patient was diuresed with IV Lasix and then switched over to oral torsemide.  Patient on metoprolol.  Follow-up with the CHF clinic. 5.  Hyponatremia secondary to congestive heart failure and chlorthalidone.  Sodium improved from 127 up to 133. 6.  Peripheral vascular disease switched over to low-dose Eliquis and Crestor 7.  Type 2 diabetes mellitus with chronic kidney disease stage IIIa.  Patient had acute kidney injury  on top of chronic kidney disease.  Since the patient is off steroids I will continue her long-acting insulin at 12 units and the short acting insulin prior to meals.  Creatinine worsened and peaked at 2.88 and has trended down to 2.40.  Follow-up as outpatient with nephrology.  Repeat BMP at follow-up appointment. 8.  [name] has improved 9.  Bleeding in right groin stopped after injection with lidocaine and epinephrine 10.   Paroxysmal atrial fibrillation rate controlled.  Patient did have a GI bleed last month but was placed on Plavix afterwards and did not bleed with heparin drip and the Plavix here.  Cardiology recommended switching over to low-dose Eliquis.  Case discussed with patient's daughter and patient about risk of bleeding.  DISCHARGE CONDITIONS:   Satisfactory  CONSULTS OBTAINED:  Treatment Team:  Wellington Hampshire, MD  DRUG ALLERGIES:   Allergies  Allergen Reactions  . Saxagliptin Diarrhea  . Epinephrine Other (See Comments)    Patient does not remember what happens when she uses this  . Atorvastatin Other (See Comments)    Muscle aches  . Codeine Other (See Comments)    Upset stomach  . Ezetimibe Other (See Comments)    Myalgias(ZETIA)  . Limonene Rash    Patient does not recall this reaction  . Nitrofurantoin Rash and Other (See Comments)    Pruitus  . Sulfa Antibiotics Rash and Other (See Comments)    Sore mouth     DISCHARGE MEDICATIONS:   Allergies as of 02/26/2020      Reactions   Saxagliptin Diarrhea   Epinephrine Other (See Comments)   Patient does not remember what happens when she uses this   Atorvastatin Other (See Comments)   Muscle aches   Codeine Other (See Comments)   Upset stomach   Ezetimibe Other (See Comments)   Myalgias(ZETIA)   Limonene Rash   Patient does not recall this reaction   Nitrofurantoin Rash, Other (See Comments)   Pruitus   Sulfa Antibiotics Rash, Other (See Comments)   Sore mouth       Medication List    STOP taking these medications   Cartia XT 180 MG 24 hr capsule Generic drug: diltiazem   chlorthalidone 25 MG tablet Commonly known as: HYGROTON   clopidogrel 75 MG tablet Commonly known as: PLAVIX   losartan 50 MG tablet Commonly known as: COZAAR   metFORMIN 500 MG 24 hr tablet Commonly known as: GLUCOPHAGE-XR     TAKE these medications   acetaminophen 650 MG CR tablet Commonly known as: TYLENOL Take 1,300 mg by mouth  every 8 (eight) hours as needed for pain.   albuterol 108 (90 Base) MCG/ACT inhaler Commonly known as: VENTOLIN HFA Inhale 2 puffs into the lungs every 6 (six) hours as needed for wheezing or shortness of breath.   apixaban 2.5 MG Tabs tablet Commonly known as: ELIQUIS Take 1 tablet (2.5 mg total) by mouth 2 (two) times daily.   B-D ULTRAFINE III SHORT PEN 31G X 8 MM Misc Generic drug: Insulin Pen Needle USE AS DIRECTED   baclofen 10 MG tablet Commonly known as: LIORESAL Take 1 tablet (10 mg total) by mouth daily as needed. What changed: See the new instructions.   Basaglar KwikPen 100 UNIT/ML Inject 0.12 mLs (12 Units total) into the skin daily.   feeding supplement (GLUCERNA SHAKE) Liqd Take 237 mLs by mouth 3 (three) times daily between meals.   ferrous sulfate 325 (65 FE) MG tablet Take 325 mg by mouth  daily with breakfast.   gabapentin 100 MG capsule Commonly known as: NEURONTIN Take 1 capsule (100 mg total) by mouth at bedtime. What changed: See the new instructions.   insulin aspart 100 UNIT/ML FlexPen Commonly known as: NovoLOG FlexPen Inject 3 Units into the skin 3 (three) times daily with meals. At lunch What changed:   how much to take  when to take this  additional instructions   melatonin 5 MG Tabs Take 0.5 tablets (2.5 mg total) by mouth at bedtime as needed (sleep).   metoprolol tartrate 25 MG tablet Commonly known as: LOPRESSOR Take 1 tablet (25 mg total) by mouth 2 (two) times daily.   nitroGLYCERIN 0.4 MG SL tablet Commonly known as: NITROSTAT 1 tab under the tongue for CP, may repeat in 5 minutes up to 3 doses   pantoprazole 40 MG tablet Commonly known as: PROTONIX Take 1 tablet (40 mg total) by mouth daily.   polyethylene glycol 17 g packet Commonly known as: MIRALAX / GLYCOLAX Take 17 g by mouth daily as needed for moderate constipation.   PRESERVISION AREDS PO Take 1 capsule by mouth 2 (two) times daily.   RA Vitamin B-12 TR 1000  MCG Tbcr Generic drug: Cyanocobalamin Take 1,000 mcg by mouth daily.   rosuvastatin 5 MG tablet Commonly known as: CRESTOR Take 5 mg by mouth every other day. Mon, Wed, Fri and Sat.   Tiotropium Bromide-Olodaterol 2.5-2.5 MCG/ACT Aers Inhale 2 puffs into the lungs daily.   torsemide 20 MG tablet Commonly known as: DEMADEX Take 2 tablets (40 mg total) by mouth daily. Start taking on: Feb 27, 2020        DISCHARGE INSTRUCTIONS:   Follow-up PMD 5 days Follow-up CHF clinic Follow-up cardiology 1 week Follow-up nephrology 2-week  If you experience worsening of your admission symptoms, develop shortness of breath, life threatening emergency, suicidal or homicidal thoughts you must seek medical attention immediately by calling 911 or calling your MD immediately  if symptoms less severe.  You Must read complete instructions/literature along with all the possible adverse reactions/side effects for all the Medicines you take and that have been prescribed to you. Take any new Medicines after you have completely understood and accept all the possible adverse reactions/side effects.   Please note  You were cared for by a hospitalist during your hospital stay. If you have any questions about your discharge medications or the care you received while you were in the hospital after you are discharged, you can call the unit and asked to speak with the hospitalist on call if the hospitalist that took care of you is not available. Once you are discharged, your primary care physician will handle any further medical issues. Please note that NO REFILLS for any discharge medications will be authorized once you are discharged, as it is imperative that you return to your primary care physician (or establish a relationship with a primary care physician if you do not have one) for your aftercare needs so that they can reassess your need for medications and monitor your lab values.    Today   CHIEF  COMPLAINT:   Chief Complaint  Patient presents with  . Respiratory Distress    HISTORY OF PRESENT ILLNESS:  Jill Hudson  is a 85 y.o. female came in with respiratory distress   VITAL SIGNS:  Blood pressure (!) 115/46, pulse 97, temperature (!) 97.5 F (36.4 C), temperature source Oral, resp. rate 19, height 5' (1.524 m), weight 53.3 kg, SpO2 99 %.  I/O:    Intake/Output Summary (Last 24 hours) at 02/26/2020 1443 Last data filed at 02/26/2020 1357 Gross per 24 hour  Intake 304.82 ml  Output 1100 ml  Net -795.18 ml    PHYSICAL EXAMINATION:  GENERAL:  85 y.o.-year-old patient lying in the bed with no acute distress.  EYES: Pupils equal, round, reactive to light and accommodation. No scleral icterus. HEENT: Head atraumatic, normocephalic. Oropharynx and nasopharynx clear.  LUNGS: Normal breath sounds bilaterally, no wheezing, rales,rhonchi or crepitation. No use of accessory muscles of respiration.  CARDIOVASCULAR: S1, S2 normal. No murmurs, rubs, or gallops.  ABDOMEN: Soft, non-tender, non-distended. Bowel sounds present. No organomegaly or mass.  EXTREMITIES: No pedal edema, cyanosis, or clubbing.  NEUROLOGIC: Cranial nerves II through XII are intact. Sensation intact. Gait not checked.  PSYCHIATRIC: The patient is alert and oriented x 3.  SKIN: Chronic lower extremity discoloration  DATA REVIEW:   CBC Recent Labs  Lab 02/26/20 0605  WBC 16.3*  HGB 10.7*  HCT 32.0*  PLT 305    Chemistries  Recent Labs  Lab 02/21/20 1417 02/22/20 0442 02/26/20 0605  NA  --    < > 133*  K  --    < > 3.3*  CL  --    < > 95*  CO2  --    < > 27  GLUCOSE  --    < > 145*  BUN  --    < > 116*  CREATININE  --    < > 2.40*  CALCIUM  --    < > 8.3*  AST 33  --   --   ALT 50*  --   --   ALKPHOS 78  --   --   BILITOT 0.7  --   --    < > = values in this interval not displayed.    Microbiology Results  Results for orders placed or performed during the hospital encounter of 02/18/20   Respiratory Panel by RT PCR (Flu A&B, Covid) - Nasopharyngeal Swab     Status: None   Collection Time: 02/18/20  5:17 AM   Specimen: Nasopharyngeal Swab  Result Value Ref Range Status   SARS Coronavirus 2 by RT PCR NEGATIVE NEGATIVE Final    Comment: (NOTE) SARS-CoV-2 target nucleic acids are NOT DETECTED. The SARS-CoV-2 RNA is generally detectable in upper respiratoy specimens during the acute phase of infection. The lowest concentration of SARS-CoV-2 viral copies this assay can detect is 131 copies/mL. A negative result does not preclude SARS-Cov-2 infection and should not be used as the sole basis for treatment or other patient management decisions. A negative result may occur with  improper specimen collection/handling, submission of specimen other than nasopharyngeal swab, presence of viral mutation(s) within the areas targeted by this assay, and inadequate number of viral copies (<131 copies/mL). A negative result must be combined with clinical observations, patient history, and epidemiological information. The expected result is Negative. Fact Sheet for Patients:  PinkCheek.be Fact Sheet for Healthcare Providers:  GravelBags.it This test is not yet ap proved or cleared by the [place] FDA and  has been authorized for detection and/or diagnosis of SARS-CoV-2 by FDA under an Emergency Use Authorization (EUA). This EUA will remain  in effect (meaning this test can be used) for the duration of the COVID-19 declaration under Section 564(b)(1) of the Act, 21 U.S.C. section 360bbb-3(b)(1), unless the authorization is terminated or revoked sooner.    Influenza A by PCR NEGATIVE NEGATIVE Final  Influenza B by PCR NEGATIVE NEGATIVE Final    Comment: (NOTE) The Xpert Xpress SARS-CoV-2/FLU/RSV assay is intended as an aid in  the diagnosis of influenza from Nasopharyngeal swab specimens and  should not be used as a sole  basis for treatment. Nasal washings and  aspirates are unacceptable for Xpert Xpress SARS-CoV-2/FLU/RSV  testing. Fact Sheet for Patients: PinkCheek.be Fact Sheet for Healthcare Providers: GravelBags.it This test is not yet approved or cleared by the [place] FDA and  has been authorized for detection and/or diagnosis of SARS-CoV-2 by  FDA under an Emergency Use Authorization (EUA). This EUA will remain  in effect (meaning this test can be used) for the duration of the  Covid-19 declaration under Section 564(b)(1) of the Act, 21  U.S.C. section 360bbb-3(b)(1), unless the authorization is  terminated or revoked. Performed at [facility], [place], [address] [place], [place] [address]   Blood culture (routine x 2)     Status: None   Collection Time: 02/18/20  6:29 AM   Specimen: BLOOD  Result Value Ref Range Status   Specimen Description BLOOD BLOOD LEFT HAND  Final   Special Requests   Final    BOTTLES DRAWN AEROBIC AND ANAEROBIC Blood Culture results may not be optimal due to an excessive volume of blood received in culture bottles   Culture   Final    NO GROWTH 5 DAYS Performed at [facility], [address], [place], [place] [address]    Report Status 02/23/2020 FINAL  Final  Blood culture (routine x 2)     Status: None   Collection Time: 02/18/20  6:29 AM   Specimen: BLOOD  Result Value Ref Range Status   Specimen Description BLOOD LEFT ANTECUBITAL  Final   Special Requests   Final    BOTTLES DRAWN AEROBIC AND ANAEROBIC Blood Culture adequate volume   Culture   Final    NO GROWTH 5 DAYS Performed at [facility], [address], [place], [place] [address]    Report Status 02/23/2020 FINAL  Final  Urine culture     Status: None   Collection Time: 02/18/20 11:23 AM   Specimen: Urine, Random  Result Value Ref Range Status   Specimen Description   Final    URINE, RANDOM Performed at  [facility], [address], [place], [place] [address]    Special Requests   Final    NONE Performed at [facility], [address] [place], [place] [address]    Culture   Final    NO GROWTH Performed at [facility], [place] [address], [place], [place] [address]    Report Status 02/19/2020 FINAL  Final    RADIOLOGY:  NM Myocar Multi W/Spect W/Wall Motion / EF  Result Date: 02/26/2020  T wave inversion was noted during stress in the II, III, aVF and V6 leads.  Horizontal ST segment depression ST segment depression was noted during stress in the V5 and V4 leads.  The study is normal.  This is a low risk study.  The left ventricular ejection fraction is normal (55-65%).  Suboptimal study due to GI uptake     Management plans discussed with the patient, family and they are in agreement.  CODE STATUS:     Code Status Orders  (From admission, onward)         Start     Ordered   02/18/20 1100  Full code  Continuous  02/18/20 1059        Code Status History    Date Active Date Inactive Code Status Order ID Comments User Context   12/25/2019 0555 12/28/2019 2204 Full Code [id number]  Sidney Ace Arvella Merles, MD ED   10/12/2018 1647 10/15/2018 1808 Full Code [id number]  Katha Cabal, MD Inpatient   09/16/2018 1254 09/16/2018 2048 Full Code [id number]  Katha Cabal, MD Inpatient   08/11/2018 1706 08/13/2018 2136 Full Code [id number]  Gorden Harms, MD Inpatient   07/26/2018 0921 07/26/2018 1652 Full Code [id number]  Katha Cabal, MD Inpatient   08/23/2017 0935 08/23/2017 1419 Full Code [id number]  Algernon Huxley, MD Inpatient   02/15/2017 1538 02/16/2017 1959 Full Code [id number]  Dustin Flock, MD Inpatient   Advance Care Planning Activity    Advance Directive Documentation     Most Recent Value  Type of Advance Directive  Living will, Healthcare Power of Attorney  Pre-existing out of facility DNR order (yellow form or pink MOST form)  --   "MOST" Form in Place?  --      TOTAL TIME TAKING CARE OF THIS PATIENT: 35 minutes.    Loletha Grayer M.D on 02/26/2020 at 2:43 PM  Between 7am to 6pm - Pager - [phone]  After 6pm go to [email or web address] - password [email or web address]  [facility]  CC: Primary care physician; [name], MD

## 2020-02-26 NOTE — Progress Notes (Signed)
Nutrition Follow Up Note   DOCUMENTATION CODES:   Not applicable  INTERVENTION:   Glucerna Shake po TID, each supplement provides 220 kcal and 10 grams of protein  NUTRITION DIAGNOSIS:   Increased nutrient needs related to chronic illness(COPD) as evidenced by increased estimated needs.  GOAL:   Patient will meet greater than or equal to 90% of their needs  -progressing   MONITOR:   PO intake, Supplement acceptance, Labs, Weight trends, Skin, I & O's  ASSESSMENT:   85 y.o. female past medical history significant for hypertension, uncontrolled diabetes mellitus type 2 last hemoglobin A1c of 6.3, chronic kidney disease stage III and creatinine of less than 1, history of's CVA/TIA peripheral vascular disease and coronary artery disease, MI, tobacco use, GI bleed, iron deficiency anemia comes into the hospital for shortness of breath  Met with pt in room today. Pt reports fairly good appetite and oral intake in hospital; pt reports that " I am eating what I can". Pt documented to be eating 75-90% of meals. Pt is also drinking 3 Glucerna per day. Per chart, pt is weight stable since admit.   Medications reviewed and include: plavix, ferrous sulfate, insulin, melatonin, solu-medrol, ocuvite, B12, miralax, torsemide, heparin  Labs reviewed: Na 133(L), K 3.3(L), BUN 116(H), creat 2.40(H) Wbc- 16.3(H), Hgb 10.7(L), Hct 32(L) cbgs- 153, 243 x 24 hrs   NUTRITION - FOCUSED PHYSICAL EXAM:    Most Recent Value  Orbital Region  No depletion  Upper Arm Region  No depletion  Thoracic and Lumbar Region  No depletion  Buccal Region  No depletion  Temple Region  Mild depletion  Clavicle Bone Region  No depletion  Clavicle and Acromion Bone Region  No depletion  Scapular Bone Region  No depletion  Dorsal Hand  Mild depletion  Patellar Region  Mild depletion  Anterior Thigh Region  Mild depletion  Posterior Calf Region  Mild depletion  Edema (RD Assessment)  Mild  Hair  Reviewed  Eyes   Reviewed  Mouth  Reviewed  Skin  Reviewed  Nails  Reviewed     Diet Order:   Diet Order            Diet heart healthy/carb modified Room service appropriate? Yes; Fluid consistency: Thin  Diet effective now             EDUCATION NEEDS:   Not appropriate for education at this time  Skin:  Skin Assessment: Reviewed RN Assessment(ecchymosis)  Last BM:  5/3- type 4  Height:   Ht Readings from Last 1 Encounters:  02/23/20 5' (1.524 m)    Weight:   Wt Readings from Last 1 Encounters:  02/26/20 53.3 kg    Ideal Body Weight:  45.4 kg  BMI:  Body mass index is 22.97 kg/m.  Estimated Nutritional Needs:   Kcal:  1300-1500kcal/day  Protein:  65-75g/day  Fluid:  >1.2L/day  Koleen Distance MS, RD, LDN Please refer to [facility] for RD and/or RD on-call/weekend/after hours pager

## 2020-02-26 NOTE — Progress Notes (Signed)
Progress Note  Patient Name: Jill Hudson Date of Encounter: 02/26/2020  Primary Cardiologist: [facility]  Subjective   No chest pain or dyspnea. She is for [facility] MPI today. Restarted on heparin gtt in the setting of newly diagnosed Afib with controlled ventricular response. HGB stable. Potassium low at 3.3. Renal function stable.   Inpatient Medications    Scheduled Meds: . budesonide (PULMICORT) nebulizer solution  0.5 mg Nebulization BID  . clopidogrel  37.5 mg Oral Daily  . feeding supplement (GLUCERNA SHAKE)  237 mL Oral TID BM  . ferrous sulfate  325 mg Oral Q breakfast  . gabapentin  100 mg Oral QHS  . insulin aspart  0-5 Units Subcutaneous QHS  . insulin aspart  0-9 Units Subcutaneous TID WC  . insulin aspart  3 Units Subcutaneous TID WC  . insulin glargine  10 Units Subcutaneous Daily  . ipratropium-albuterol  3 mL Nebulization BID  . magic mouthwash  5 mL Oral TID  . melatonin  2.5 mg Oral QHS  . metoprolol tartrate  25 mg Oral BID  . multivitamin-lutein  1 capsule Oral BID  . polyethylene glycol  17 g Oral Daily  . rosuvastatin  5 mg Oral Once per day on Mon Wed Fri Sat  . sodium chloride flush  3 mL Intravenous Q12H  . torsemide  40 mg Oral Daily   Continuous Infusions: . heparin 600 Units/hr (02/26/20 0159)   PRN Meds: acetaminophen, albuterol, clonazepam, dextromethorphan-guaiFENesin, hydrALAZINE, morphine injection, nitroGLYCERIN, ondansetron (ZOFRAN) IV   Vital Signs    Vitals:   02/25/20 1935 02/25/20 1935 02/25/20 2023 02/26/20 0354  BP: 104/76 104/76  (!) 118/58  Pulse: (!) 104 75  88  Resp:      Temp: 98.2 F (36.8 C) 98.2 F (36.8 C)  (!) 97.5 F (36.4 C)  TempSrc: Oral Oral  Oral  SpO2: 98% 98% 96% 96%  Weight:    53.3 kg  Height:        Intake/Output Summary (Last 24 hours) at 02/26/2020 0723 Last data filed at 02/26/2020 0407 Gross per 24 hour  Intake 304.82 ml  Output 1650 ml  Net -1345.18 ml   Filed Weights   02/24/20 0418  02/25/20 0539 02/26/20 0354  Weight: 53.3 kg 53.1 kg 53.3 kg    Telemetry    Afib, 90s bpm - Personally Reviewed  ECG    No new tracings - Personally Reviewed  Physical Exam   GEN: No acute distress.   Neck: No JVD. Cardiac: Irregularly irregular, no murmurs, rubs, or gallops.  Respiratory: Clear to auscultation bilaterally.  GI: Soft, nontender, non-distended.   MS: No edema; No deformity. Neuro:  Alert and oriented x 3; Nonfocal.  Psych: Normal affect.  Labs    Chemistry Recent Labs  Lab 02/21/20 1417 02/22/20 0442 02/23/20 0621 02/24/20 0459 02/25/20 0450  NA  --    < > 132* 131* 134*  K  --    < > 4.7 3.8 3.4*  CL  --    < > 97* 96* 97*  CO2  --    < > 21* 23 29  GLUCOSE  --    < > 214* 166* 132*  BUN  --    < > 116* 121* 120*  CREATININE  --    < > 2.88* 2.59* 2.42*  CALCIUM  --    < > 8.6* 8.3* 8.5*  PROT 6.1*  --   --   --   --  ALBUMIN 3.5  --   --   --   --   AST 33  --   --   --   --   ALT 50*  --   --   --   --   ALKPHOS 78  --   --   --   --   BILITOT 0.7  --   --   --   --   GFRNONAA  --    < > 14* 16* 18*  GFRAA  --    < > 17* 19* 21*  ANIONGAP  --    < > 14 12 8    < > = values in this interval not displayed.     Hematology Recent Labs  Lab 02/23/20 0093 02/24/20 0459 02/26/20 0605  WBC 16.4* 19.2* 16.3*  RBC 3.14* 3.36* 3.57*  HGB 9.6* 10.0* 10.7*  HCT 27.7* 29.7* 32.0*  MCV 88.2 88.4 89.6  MCH 30.6 29.8 30.0  MCHC 34.7 33.7 33.4  RDW 13.7 13.9 14.2  PLT 289 326 305    Cardiac EnzymesNo results for input(s): TROPONINI in the last 168 hours. No results for input(s): TROPIPOC in the last 168 hours.   BNPNo results for input(s): BNP, PROBNP in the last 168 hours.   DDimer No results for input(s): DDIMER in the last 168 hours.   Radiology    US RENAL  Result Date: 02/21/2020 IMPRESSION: 1. Renal echogenicity mildly increased bilaterally, a finding that may be indicative of a degree of medical renal disease. Renal cortical  thickness within normal limits bilaterally. No obstructing focus in either kidney. 2.  There are cysts in the left kidney. 3.  Bilateral pleural effusions. Electronically Signed   By: Lowella Grip III M.D.   On: 02/21/2020 11:02  Cardiac Studies   RHC 02/23/2020: Right heart pressures RA mean of 3 RV 37/2 and 9 PA 37/11 mean 24 Wedge pressure mean 13 Cardiac output 3.11 Cardiac index 2.04  Final Conclusions:  High normal right heart pressures Mean PA pressure 24, wedge 13, RA 3   Recommendations:  Given dramatic improvement in her symptoms, less abdominal bloating, improved shortness of breath, Consider transition to oral diuretics tomorrow Will discuss with nephrology and hospitalist service __________  2D echo 02/19/2020: 1. Left ventricular ejection fraction, by estimation, is 55 to 60%. The  left ventricle has normal function. The left ventricle has no regional  wall motion abnormalities. There is mild left ventricular hypertrophy.  Left ventricular diastolic parameters  are consistent with Grade II diastolic dysfunction (pseudonormalization).  2. Right ventricular systolic function is normal. The right ventricular  size is normal. There is moderately elevated pulmonary artery systolic  pressure.  3. Left atrial size was mildly dilated.  4. The mitral valve is myxomatous. Moderate mitral valve regurgitation.  No evidence of mitral stenosis.  5. The aortic valve is normal in structure. Aortic valve regurgitation is  not visualized. Mild aortic valve sclerosis is present, with no evidence  of aortic valve stenosis.  6. The inferior vena cava is dilated in size with >50% respiratory  variability, suggesting right atrial pressure of 8 mmHg.  Patient Profile     85 y.o. female with history of CAD, GI bleed with anemia, CKD stage III, CVA, DM2, HTN, HLD admitted with acute hypoxic respiratory distress with NSTEMI, acute on chronic HFpEF/pulmonary hypertension.   Assessment & Plan    1. CAD with NSTEMI: -Prior LHC in 1998 with 40% left main and 95% ramus  stenoses  -She has completed > 48 hours of heparin gtt -Plavix  -Ideally, she would undergo a LHC, though this is unable to be performed at this time with worsening renal function -In this setting, she is for [place] MPI this morning -NPO   2. Acute hypoxic respiratory distress: -Multifactorial including acute on chronic HFpEF, pulmonary hypertension, AECOPD and anemia -Transitioned to room air on 5/1 -RHC as above  3. HFpEF/pulmonary hypertension: -Volume status is improved -Continue torsemide 40 mg daily -Renal function improving stable  4.  New onset A. Fib: -Controlled ventricular response -Continue metoprolol -Remains on heparin gtt, will transition to [facility] prior to discharge, pending ischemic evaluation as above, once it is clear she will not require inpatient invasive procedures  -Would attempt to avoid dual therapy given anemia and history of GI bleed -CHA2DS2-VASc 9 (CHF, HTN, age x 2, DM, stroke x 2, vascular disease, gender)  5. Acute on CKD stage III: -Renal function is stable -Renal ultrasound without obstruction   6. History of GI bleed with anemia: -HGB low, though stable  7. HTN: -Blood pressure is reasonably controlled -Torsemide -Metoprolol   8. HLD: -LDL 54 this admission  -Crestor   9. Hyponatremia: -Stable -Free water restriction   For questions or updates, please contact [place] Please consult [email or web address] for contact info under Cardiology/STEMI.    Signed, Christell Faith, PA-C [place] Pager: [phone] 02/26/2020, 7:23 AM

## 2020-02-26 NOTE — Progress Notes (Signed)
Physical Therapy Treatment Patient Details Name: DETTA Hudson MRN: [id number] DOB: July 13, 1935 Today's Date: 02/26/2020    History of Present Illness Jill Hudson is a 85 y.o. female with medical history significant of Hypertension, hyperlipidemia, diabetes mellitus, stroke, TIA, GERD, anxiety, PVD, CAD, myocardial infarction, former smoker, CKD stage III, GI bleeding, iron deficiency anemia, who presented to the ED with shortness of breath. Breathing ad been progressively worsening.  Per EMS, patient pt woke up with gasping for air, and rescue found pt's oxygen saturation at 79% on RA. Pt was given 125 mg of solumedrol and 1.5 duonebs, pt's oxygen saturation improved to 98% on NSR. At arrival to ED, pt still has acute respiratory distress.  BiPAP was started.  Patient states that she has dry cough, no fever or chills.  She had some chest discomfort earlier, which has resolved. Patient states that she has mild intermittent loose stool bowel movement recently, but currently no nausea, vomiting, diarrhea or abdominal pain.  No symptoms of UTI or unilateral weakness. She was found to have BNP 600, troponin 67 -->143, WBC 17.2, negative Covid PCR, lactic acid 2.5, sodium 128, renal function close to baseline, temperature 97.7, blood pressure 165/56, heart rate 76, RR 27, oxygen saturation 99% on BiPAP.  Chest x-ray showed interstitial pulmonary edema and obesity bilateral basilar opacity.  Patient is now admitted to stepdown as inpatient for acute respiratory failure secondary to COPD exacerbation, CHF with elevated troponin likely due to demand ischemia, and hypotension. Per cardiology note pt also with recent fall and scalp contusion.     PT Comments    Pt alert, agreeable to PT session, eager to mobilize. RN in room to administer medication, pt transferred to EOB mod I, and demonstrated good balance while taking medicines (able to reach inside and outside BOS without assistance, weight shifted well). Sit <>  stand with RW, mod I. The patient ambulated ~53ft with RW and CGA, cued for RW positioning but no physical assist needed, no LOB noted. SpO2 and HR monitored continuously on room air spO2 >90%, and HR in 120s to low 130s with ambulation. Pt up in chair at end of session, all needs in reach.  The patient would benefit from further skilled PT intervention to continue to progress towards goals. Recommendation remains appropriate.        Follow Up Recommendations  Home health PT     Equipment Recommendations  None recommended by PT    Recommendations for Other Services       Precautions / Restrictions Precautions Precautions: Fall Restrictions Weight Bearing Restrictions: No    Mobility  Bed Mobility Overal bed mobility: Modified Independent                Transfers Overall transfer level: Modified independent Equipment used: Rolling walker (2 wheeled)                Ambulation/Gait Ambulation/Gait assistance: Min guard Gait Distance (Feet): 90 Feet Assistive device: Rolling walker (2 wheeled) Gait Pattern/deviations: Step-through pattern Gait velocity: decr   General Gait Details: Pt with improved safety this session. No LOB noted, occasionally cued for RW positioning to optimize safety.   Stairs             Wheelchair Mobility    Modified Rankin (Stroke Patients Only)       Balance Overall balance assessment: Needs assistance Sitting-balance support: Feet supported Sitting balance-Leahy Scale: Good     Standing balance support: Bilateral upper extremity supported;During functional activity  Standing balance comment: Pt with improved safety with RW                            Cognition Arousal/Alertness: Awake/alert Behavior During Therapy: WFL for tasks assessed/performed;Anxious Overall Cognitive Status: Within Functional Limits for tasks assessed                                        Exercises Other  Exercises Other Exercises: Pt able to sit EOB for several minutes for medication administration with RN. good balance noted, no assist needed for reaching or holding items. Other Exercises: spO2 monitored continuously, on room air readings 90% or higher, HR in 120s-130s with ambulation.    General Comments        Pertinent Vitals/Pain Pain Assessment: No/denies pain    Home Living                      Prior Function            PT Goals (current goals can now be found in the care plan section) Progress towards PT goals: Progressing toward goals    Frequency    Min 2X/week      PT Plan Current plan remains appropriate    Co-evaluation              AM-PAC PT "6 Clicks" Mobility   Outcome Measure  Help needed turning from your back to your side while in a flat bed without using bedrails?: None Help needed moving from lying on your back to sitting on the side of a flat bed without using bedrails?: None Help needed moving to and from a bed to a chair (including a wheelchair)?: None Help needed standing up from a chair using your arms (e.g., wheelchair or bedside chair)?: None Help needed to walk in hospital room?: A Little Help needed climbing 3-5 steps with a railing? : A Little 6 Click Score: 22    End of Session Equipment Utilized During Treatment: Gait belt Activity Tolerance: Patient tolerated treatment well Patient left: in chair;with call bell/phone within reach Nurse Communication: Mobility status PT Visit Diagnosis: Other abnormalities of gait and mobility (R26.89);Muscle weakness (generalized) (M62.81);Difficulty in walking, not elsewhere classified (R26.2)     Time: 3428-7681 PT Time Calculation (min) (ACUTE ONLY): 24 min  Charges:  $Therapeutic Exercise: 23-37 mins                     Lieutenant Diego PT, DPT 11:21 AM,02/26/20

## 2020-02-26 NOTE — Care Management Important Message (Signed)
Important Message  Patient Details  Name: Jill Hudson MRN: [id number] Date of Birth: [birth year]   Medicare Important Message Given:  Yes     Dannette Barbara 02/26/2020, 12:35 PM

## 2020-02-26 NOTE — Consult Note (Addendum)
ANTICOAGULATION CONSULT NOTE - Initial Consult  Pharmacy Consult for Heparin Drip Indication: atrial fibrillation  Allergies  Allergen Reactions  . Saxagliptin Diarrhea  . Epinephrine Other (See Comments)    Patient does not remember what happens when she uses this  . Atorvastatin Other (See Comments)    Muscle aches  . Codeine Other (See Comments)    Upset stomach  . Ezetimibe Other (See Comments)    Myalgias(ZETIA)  . Limonene Rash    Patient does not recall this reaction  . Nitrofurantoin Rash and Other (See Comments)    Pruitus  . Sulfa Antibiotics Rash and Other (See Comments)    Sore mouth     Patient Measurements: Height: 5' (152.4 cm) Weight: 53.3 kg (117 lb 9.6 oz) IBW/kg (Calculated) : 45.5 Heparin Dosing Weight: 53.1kg  Vital Signs: Temp: 97.5 F (36.4 C) (05/03 0354) Temp Source: Oral (05/03 0354) BP: 118/58 (05/03 0354) Pulse Rate: 88 (05/03 0354)  Labs: Recent Labs    02/24/20 0459 02/25/20 0450 02/25/20 2052 02/26/20 0605  HGB 10.0*  --   --  10.7*  HCT 29.7*  --   --  32.0*  PLT 326  --   --  305  HEPARINUNFRC  --   --  0.90* 1.09*  CREATININE 2.59* 2.42*  --  2.40*    Estimated Creatinine Clearance: 12.8 mL/min (A) (by C-G formula based on SCr of 2.4 mg/dL (H)).   Medical History: Past Medical History:  Diagnosis Date  . Allergies   . Anxiety   . Arthritis    spine and shoulder  . Atherosclerosis of artery of extremity with rest pain ([place]) 10/12/2018  . Cancer ([place])    skin  . Diabetes mellitus without complication ([place])   . Heart murmur   . Hyperlipidemia   . Hypertension   . Macula lutea degeneration   . Mitral and aortic valve disease   . Myocardial infarction [facility])    may have had a "light" heart attack  . Occasional tremors   . PAD (peripheral artery disease) ([place])   . Shingles    patient unaware but daughter confirms. it was a long time ago  . Stroke [facility]) 01/2017   may have had a slight stroke  . TIA (transient  ischemic attack) 01/2017  . UTI (urinary tract infection)   . Vascular disease, peripheral ([facility])     Medications:  Pt on half dose 37.5mg  plavix daily (bleed history) prior to admission - last dose 5/2 @ 0932 - now being held  No DOAC's of note for a fib prior to admission  Pt last SQ heparin dose 5000 units 5/2 @ 0933  Assessment: 85 yo female with a fib and hx of GIB - pharmacy has been consulted to initiate and monitor a heparin drip for a fib.  Goal of Therapy:  Heparin level 0.3-0.7 units/ml Monitor platelets by anticoagulation protocol: Yes   Plan:  Will forgo bolus given recent administration of SQH and clopidogrel with hx of GIB - will start drip @ 750 units/hr and check HL in 8 hours per protocol.  05/02 @ 2110 HL 0.90, supratherapeutic rate decreased to 600 units/hr  05/03 @ 0605 HL 1.09, supratherapeutic, HGB/plt stable  Will hold drip for 1 hour and then decrease rate to 500 units/hour - nursing informed of these changes.   Will check HL in 8 hours s/p rate change. Will check CBC with AM labs.   Lu Duffel, PharmD Clinical Pharmacist 02/26/2020 7:56 AM

## 2020-02-27 ENCOUNTER — Telehealth: Payer: Self-pay

## 2020-02-27 NOTE — Progress Notes (Signed)
Patient ID: Jill Hudson, female    DOB: 1935-06-01, 85 y.o.   MRN: [id number]  HPI  [name] is a 85 y/o female with a history of DM, hyperlipidemia, HTN, stroke, anxiety, vascular disease, TIA, PAD, previous tobacco use and chronic heart failure.   Echo report from 02/19/20 reviewed and showed an EF of 55-60% along with mild LVH, moderately elevated PA Pressure and moderate MR.   Catheterization done 02/23/20 showed:  Hemodynamic findings consistent with pulmonary hypertension.  Admitted 02/18/20 due to acute hypoxic respiratory failure. Initially needed oxygen but was then able to be weaned off of it. Cardiology and nephrology consults obtained. NSTEMI but conservative management recommended. Given nebulizers and steroids due to COPD exacerbation. Initially given IV lasix with transition to oral diuretics. Discharged after 8 days. Was in the ED 01/06/20 due to mechanical fall. [place] out of bed and hit her head on the dresser drawer. Head CT obtained as patient on anticoagulation which was negative and patient was released.   She presents today for her initial visit with a chief complaint of minimal fatigue upon moderate exertion. She describes this as chronic in nature having been present for several months. She has associated shortness of breath, off-balance and easy bruising along with this. She denies any difficulty sleeping, abdominal distention, palpitations, pedal edema, chest pain, cough or weight gain.   Has PT from [facility] coming out tomorrow.   Past Medical History:  Diagnosis Date  . Allergies   . Anxiety   . Arthritis    spine and shoulder  . Atherosclerosis of artery of extremity with rest pain ([place]) 10/12/2018  . Cancer ([place])    skin  . CHF (congestive heart failure) ([place])   . Diabetes mellitus without complication ([place])   . Heart murmur   . Hyperlipidemia   . Hypertension   . Macula lutea degeneration   . Mitral and aortic valve disease   . Myocardial  infarction [facility])    may have had a "light" heart attack  . Occasional tremors   . PAD (peripheral artery disease) ([place])   . Shingles    patient unaware but daughter confirms. it was a long time ago  . Stroke [facility]) 01/2017   may have had a slight stroke  . TIA (transient ischemic attack) 01/2017  . UTI (urinary tract infection)   . Vascular disease, peripheral [facility])    Past Surgical History:  Procedure Laterality Date  . CARDIAC CATHETERIZATION  1998   40% LM, 95% Ramus interm  . CATARACT EXTRACTION, BILATERAL    . COLONOSCOPY WITH PROPOFOL N/A 08/12/2018   Procedure: COLONOSCOPY WITH PROPOFOL;  Surgeon: Lucilla Lame, MD;  Location: [facility] ENDOSCOPY;  Service: Endoscopy;  Laterality: N/A;  . ENDARTERECTOMY FEMORAL Left 10/12/2018   Procedure: ENDARTERECTOMY FEMORAL;  Surgeon: Katha Cabal, MD;  Location: [facility] ORS;  Service: Vascular;  Laterality: Left;  angioplasty and left SFA stent placement  . EYE SURGERY Bilateral    cataract extractions  . LOWER EXTREMITY ANGIOGRAPHY Left 08/23/2017   Procedure: Lower Extremity Angiography;  Surgeon: Algernon Huxley, MD;  Location: [place] CV LAB;  Service: Cardiovascular;  Laterality: Left;  . LOWER EXTREMITY ANGIOGRAPHY Left 07/26/2018   Procedure: LOWER EXTREMITY ANGIOGRAPHY;  Surgeon: Katha Cabal, MD;  Location: [place] CV LAB;  Service: Cardiovascular;  Laterality: Left;  . LOWER EXTREMITY ANGIOGRAPHY Left 09/16/2018   Procedure: LOWER EXTREMITY ANGIOGRAPHY;  Surgeon: Katha Cabal, MD;  Location: [facility] INVASIVE CV  LAB;  Service: Cardiovascular;  Laterality: Left;  . PTCA  08/2013   Left common iliac  . PTCA  12/2012   left ext iliac  . RIGHT HEART CATH N/A 02/23/2020   Procedure: RIGHT HEART CATH;  Surgeon: Minna Merritts, MD;  Location: [place] CV LAB;  Service: Cardiovascular;  Laterality: N/A;  . TUBAL LIGATION     Family History  Problem Relation Age of Onset  . Dementia Mother   . Diabetes Father     Social History   Tobacco Use  . Smoking status: Former Smoker    Packs/day: 2.00    Years: 37.00    Pack years: 74.00    Types: Cigarettes    Quit date: 1980    Years since quitting: 41.3  . Smokeless tobacco: Never Used  . Tobacco comment: smoking cessation materials not required  Substance Use Topics  . Alcohol use: No    Alcohol/week: 0.0 standard drinks   Allergies  Allergen Reactions  . Saxagliptin Diarrhea  . Epinephrine Other (See Comments)    Patient does not remember what happens when she uses this  . Atorvastatin Other (See Comments)    Muscle aches  . Codeine Other (See Comments)    Upset stomach  . Ezetimibe Other (See Comments)    Myalgias(ZETIA)  . Limonene Rash    Patient does not recall this reaction  . Nitrofurantoin Rash and Other (See Comments)    Pruitus  . Sulfa Antibiotics Rash and Other (See Comments)    Sore mouth    Prior to Admission medications   Medication Sig Start Date End Date Taking? Authorizing Provider  acetaminophen (TYLENOL) 650 MG CR tablet Take 1,300 mg by mouth every 8 (eight) hours as needed for pain.   Yes [provider]  albuterol (VENTOLIN HFA) 108 (90 Base) MCG/ACT inhaler Inhale 2 puffs into the lungs every 6 (six) hours as needed for wheezing or shortness of breath. 02/26/20  Yes Wieting, Richard, MD  apixaban (ELIQUIS) 2.5 MG TABS tablet Take 1 tablet (2.5 mg total) by mouth 2 (two) times daily. 02/26/20  Yes Loletha Grayer, MD  B-D ULTRAFINE III SHORT PEN 31G X 8 MM MISC USE AS DIRECTED 12/19/18  Yes [name], MD  Cyanocobalamin (RA VITAMIN B-12 TR) 1000 MCG TBCR Take 1,000 mcg by mouth daily.    Yes [provider]  feeding supplement, GLUCERNA SHAKE, (GLUCERNA SHAKE) LIQD Take 237 mLs by mouth 3 (three) times daily between meals. 12/28/19  Yes Wieting, Richard, MD  ferrous sulfate 325 (65 FE) MG tablet Take 325 mg by mouth daily with breakfast.   Yes [provider]  gabapentin (NEURONTIN)  100 MG capsule Take 1 capsule (100 mg total) by mouth at bedtime. 02/26/20  Yes Wieting, Richard, MD  insulin aspart (NOVOLOG FLEXPEN) 100 UNIT/ML FlexPen Inject 3 Units into the skin 3 (three) times daily with meals. At lunch Patient taking differently: Inject 2 Units into the skin daily. At breakfast 01/18/19  Yes [name], MD  Insulin Glargine (BASAGLAR KWIKPEN) 100 UNIT/ML SOPN Inject 0.12 mLs (12 Units total) into the skin daily. 06/27/19  Yes [name], MD  melatonin 5 MG TABS Take 0.5 tablets (2.5 mg total) by mouth at bedtime as needed (sleep). 02/26/20  Yes Wieting, Richard, MD  metoprolol tartrate (LOPRESSOR) 25 MG tablet Take 1 tablet (25 mg total) by mouth 2 (two) times daily. 02/26/20  Yes Loletha Grayer, MD  Multiple Vitamins-Minerals (PRESERVISION AREDS PO)  Take 1 capsule by mouth 2 (two) times daily.    Yes [provider]  rosuvastatin (CRESTOR) 5 MG tablet Take 5 mg by mouth every other day. Mon, Wed, Fri and Sat. 03/02/18  Yes [provider]  torsemide (DEMADEX) 20 MG tablet Take 2 tablets (40 mg total) by mouth daily. 02/27/20  Yes Loletha Grayer, MD  Tiotropium Bromide-Olodaterol 2.5-2.5 MCG/ACT AERS Inhale 2 puffs into the lungs daily. Patient not taking: Reported on 02/28/2020 02/26/20   Loletha Grayer, MD      Review of Systems  Constitutional: Positive for appetite change (decreased) and fatigue (little bit).  HENT: Negative for congestion, postnasal drip and sore throat.   Eyes: Negative.   Respiratory: Positive for shortness of breath. Negative for cough.   Cardiovascular: Negative for chest pain, palpitations and leg swelling.  Gastrointestinal: Negative for abdominal distention and abdominal pain.  Endocrine: Negative.   Genitourinary: Negative.   Musculoskeletal: Negative for back pain and neck pain.  Skin: Negative.   Allergic/Immunologic: Negative.   Neurological: Positive for light-headedness (off balance). Negative for dizziness.   Hematological: Negative for adenopathy. Bruises/bleeds easily.  Psychiatric/Behavioral: Negative for dysphoric mood and sleep disturbance (sleeping on 2 pillows). The patient is not nervous/anxious.     Vitals:   02/28/20 1233  BP: 125/82  Pulse: 90  Resp: 16  SpO2: 100%  Weight: 114 lb (51.7 kg)  Height: 5' (1.524 m)   Wt Readings from Last 3 Encounters:  02/28/20 114 lb (51.7 kg)  02/26/20 117 lb 9.6 oz (53.3 kg)  01/08/20 117 lb (53.1 kg)   Lab Results  Component Value Date   CREATININE 2.40 (H) 02/26/2020   CREATININE 2.42 (H) 02/25/2020   CREATININE 2.59 (H) 02/24/2020     Physical Exam Vitals and nursing note reviewed.  Constitutional:      Appearance: Normal appearance.  HENT:     Head: Normocephalic and atraumatic.  Cardiovascular:     Rate and Rhythm: Normal rate and regular rhythm.  Pulmonary:     Effort: Pulmonary effort is normal. No respiratory distress.     Breath sounds: No wheezing or rales.  Abdominal:     General: Abdomen is flat.     Palpations: Abdomen is soft.     Tenderness: There is no abdominal tenderness.  Musculoskeletal:        General: No tenderness.     Cervical back: Normal range of motion and neck supple.     Right lower leg: No edema.     Left lower leg: No edema.  Skin:    General: Skin is warm and dry.     Findings: Bruising (on both arms) present.  Neurological:     General: No focal deficit present.     Mental Status: She is alert and oriented to person, place, and time.  Psychiatric:        Mood and Affect: Mood normal.        Behavior: Behavior normal.    Assessment & Plan:  1: Chronic heart failure with preserved ejection fraction with structural changes- - NYHA class II - euvolemic today - weighing daily and says that her weight has been stable; instructed to call for an overnight weight gain of >2 pounds or a weekly weight gain of >5 pounds - not adding salt and daughter has been reading food labels; reviewed  the importance of closely following a low sodium diet and written dietary information and low sodium cookbook were given to the patient -  saw cardiology Tye Savoy) 01/02/20 & returns soon (they think) - BNP 02/18/20 was 600.0 - PharmD reconciled medications with the patient  2: HTN- - BP looks good today - saw PCP Army Melia) 01/08/20 - BMP 02/26/20 reviewed and showed sodium 133, potassium 3.3, creatinine 2.4 and GFR 18 - daughter says that they are waiting to hear about a nephrology appointment  3: DM- - saw endocrinology Honor Junes) 10/16/2019 - A1c 02/20/20 was 6.6% - fasting glucose was 116 and then she ate numerous grapes and it went up to 382   Patient did not bring her medications nor a list. Each medication was verbally reviewed with the patient and she was encouraged to bring the bottles to every visit to confirm accuracy of list.  Return in 2 months or sooner for any questions/problems before then.

## 2020-02-27 NOTE — Telephone Encounter (Signed)
Attempted to reach patient for TCM call and schedule hospital follow up appt. Phone busy and unable to leave message.   Pt may reach me directly at [phone]

## 2020-02-28 ENCOUNTER — Ambulatory Visit: Payer: Medicare Other | Attending: Family | Admitting: Family

## 2020-02-28 ENCOUNTER — Other Ambulatory Visit: Payer: Self-pay

## 2020-02-28 ENCOUNTER — Encounter: Payer: Self-pay | Admitting: Family

## 2020-02-28 VITALS — BP 125/82 | HR 90 | Resp 16 | Ht 60.0 in | Wt 114.0 lb

## 2020-02-28 DIAGNOSIS — E1122 Type 2 diabetes mellitus with diabetic chronic kidney disease: Secondary | ICD-10-CM

## 2020-02-28 DIAGNOSIS — R0602 Shortness of breath: Secondary | ICD-10-CM | POA: Diagnosis not present

## 2020-02-28 DIAGNOSIS — I252 Old myocardial infarction: Secondary | ICD-10-CM | POA: Insufficient documentation

## 2020-02-28 DIAGNOSIS — N184 Chronic kidney disease, stage 4 (severe): Secondary | ICD-10-CM

## 2020-02-28 DIAGNOSIS — Z794 Long term (current) use of insulin: Secondary | ICD-10-CM | POA: Diagnosis not present

## 2020-02-28 DIAGNOSIS — I10 Essential (primary) hypertension: Secondary | ICD-10-CM

## 2020-02-28 DIAGNOSIS — I110 Hypertensive heart disease with heart failure: Secondary | ICD-10-CM | POA: Diagnosis not present

## 2020-02-28 DIAGNOSIS — E1136 Type 2 diabetes mellitus with diabetic cataract: Secondary | ICD-10-CM | POA: Insufficient documentation

## 2020-02-28 DIAGNOSIS — Z79899 Other long term (current) drug therapy: Secondary | ICD-10-CM | POA: Diagnosis not present

## 2020-02-28 DIAGNOSIS — E1159 Type 2 diabetes mellitus with other circulatory complications: Secondary | ICD-10-CM | POA: Diagnosis not present

## 2020-02-28 DIAGNOSIS — E785 Hyperlipidemia, unspecified: Secondary | ICD-10-CM | POA: Diagnosis not present

## 2020-02-28 DIAGNOSIS — J441 Chronic obstructive pulmonary disease with (acute) exacerbation: Secondary | ICD-10-CM | POA: Insufficient documentation

## 2020-02-28 DIAGNOSIS — Z7901 Long term (current) use of anticoagulants: Secondary | ICD-10-CM | POA: Diagnosis not present

## 2020-02-28 DIAGNOSIS — Z8673 Personal history of transient ischemic attack (TIA), and cerebral infarction without residual deficits: Secondary | ICD-10-CM | POA: Diagnosis not present

## 2020-02-28 DIAGNOSIS — I5032 Chronic diastolic (congestive) heart failure: Secondary | ICD-10-CM

## 2020-02-28 DIAGNOSIS — Z87891 Personal history of nicotine dependence: Secondary | ICD-10-CM | POA: Diagnosis not present

## 2020-02-28 DIAGNOSIS — R5383 Other fatigue: Secondary | ICD-10-CM | POA: Insufficient documentation

## 2020-02-28 NOTE — Patient Instructions (Signed)
Continue weighing daily and call for an overnight weight gain of > 2 pounds or a weekly weight gain of >5 pounds. 

## 2020-02-28 NOTE — Progress Notes (Signed)
[facility] - PHARMACIST COUNSELING NOTE  ADHERENCE ASSESSMENT  Adherence strategy: Patient utilizes a pillbox at home. Patient endorses some previous non-compliance with medications prior to most recent hospitalization.    Do you ever forget to take your medication? [x] Yes (1) [] No (0)  Do you ever skip doses due to side effects? [] Yes (1) [x] No (0)  Do you have trouble affording your medicines? [] Yes (1) [x] No (0)  Are you ever unable to pick up your medication due to transportation difficulties? [] Yes (1) [x] No (0)  Do you ever stop taking your medications because you don't believe they are helping? [] Yes (1) [x] No (0)  Total score 0   Recommendations given to patient about increasing adherence: Advised patient to try setting alarms or incorporate them into other routine activities (e.g. when brushing teeth)  Guideline-Directed Medical Therapy/Evidence Based Medicine  ACE/ARB/ARNI: Losartan 50 mg daily (discontinued in setting of renal injury) Beta Blocker: Metoprolol 25 mg BID Aldosterone Antagonist: None Diuretic: Torsemide 40 mg daily    SUBJECTIVE   Past Medical History:  Diagnosis Date  . Allergies   . Anxiety   . Arthritis    spine and shoulder  . Atherosclerosis of artery of extremity with rest pain ([place]) 10/12/2018  . Cancer ([place])    skin  . CHF (congestive heart failure) ([place])   . Diabetes mellitus without complication ([place])   . Heart murmur   . Hyperlipidemia   . Hypertension   . Macula lutea degeneration   . Mitral and aortic valve disease   . Myocardial infarction [facility])    may have had a "light" heart attack  . Occasional tremors   . PAD (peripheral artery disease) ([place])   . Shingles    patient unaware but daughter confirms. it was a long time ago  . Stroke [facility]) 01/2017   may have had a slight stroke  . TIA (transient ischemic attack) 01/2017  . UTI (urinary tract infection)   . Vascular disease,  peripheral ([place])        OBJECTIVE   Vital signs: HR 90, BP 125/82, weight (pounds) 114 ECHO: Date 02/19/20, EF 55-60%, notes: mild LVH, grade II diastolic dysfunction, moderately elevated PA systolic pressures, moderate MVR, trivial TVR RHC: Date 02/23/20, Mean PA pressure 24, high normal right heart pressures  BMP Latest Ref Rng & Units 02/26/2020 02/25/2020 02/24/2020  Glucose 70 - 99 mg/dL 145(H) 132(H) 166(H)  BUN 8 - 23 mg/dL 116(H) 120(H) 121(H)  Creatinine 0.44 - 1.00 mg/dL 2.40(H) 2.42(H) 2.59(H)  BUN/Creat Ratio 12 - 28 - - -  Sodium 135 - 145 mmol/L 133(L) 134(L) 131(L)  Potassium 3.5 - 5.1 mmol/L 3.3(L) 3.4(L) 3.8  Chloride 98 - 111 mmol/L 95(L) 97(L) 96(L)  CO2 22 - 32 mmol/L 27 29 23   Calcium 8.9 - 10.3 mg/dL 8.3(L) 8.5(L) 8.3(L)    ASSESSMENT Patient is an 85 y/o F with a medical history significant for diastolic CHF, COPD, hypertension, atrial fibrillation, PVD, NSTEMI, GI bleed, T2DM, CKD who presents to clinic for CHF management.  Patient was recently hospitalized 02/18/20 - 02/26/20 for respiratory failure, NSTEMI, CHF, COPD exacerbation  PLAN  1). Diastolic CHF -Torsemide 40 mg daily -Patient reports breathing has improved since hospitalization  2). Atrial fibrillation -Metoprolol 25 mg BID, apixaban 2.5 mg BID (Age >80, Scr > 1.5)- started during recent hospitalization -Denies signs/symptoms of bleeding  3). ASCVD -Hx of stroke / PVD -Patient was on reduce dose clopidogrel 37.5 mg BID (in  view of GIB hx) which was discontinued upon initiation of apixaban. Patient is no longer on any antiplatelet agent -Rosuvastatin 5 mg EOD. Per chart, previous intolerance to atorvastatin & ezetimibe -Last lipid panel 02/19/2020 with HDL 56, LDL 54, TG 34  4). Hypertension -BP controlled at 125/82 today -Losartan 50 mg daily, chlorthalidone 25 mg daily, and diltiazem 180 mg ER discontinued during most recent hospitalization   5). Diabetes, controlled -Mot recent HgbA1C was 6.6%  on 02/20/2020 -Antidiabetic regimen includes Novolog 3 units TIDAC and Basaglar 12 units daily -Metformin discontinued secondary to kidney injury during most recent hospitalization -Fasting blood glucoses reportedly in the low 100s, however pre-meal glucoses running >300 at times (depending on carb intake) -Counseled patient on monitoring carbohydrates and avoiding foods that cause significant elevations in blood sugar  6). Acute kidney injury on CKD stage IIIa -CKD secondary to hypertension / diabetes -Acute kidney injury in setting of cardiorenal syndrome -Baseline creatinine 1.1, GFR 47 on 12/27/2019 and most recently creatinine 2.4, GFR 18 on 02/26/2020 -Un-clear if this will be new baseline   Time spent: 15 minutes  [place] Resident 02/28/2020 12:54 PM    Current Outpatient Medications:  .  acetaminophen (TYLENOL) 650 MG CR tablet, Take 1,300 mg by mouth every 8 (eight) hours as needed for pain., Disp: , Rfl:  .  albuterol (VENTOLIN HFA) 108 (90 Base) MCG/ACT inhaler, Inhale 2 puffs into the lungs every 6 (six) hours as needed for wheezing or shortness of breath., Disp: 18 g, Rfl: 0 .  apixaban (ELIQUIS) 2.5 MG TABS tablet, Take 1 tablet (2.5 mg total) by mouth 2 (two) times daily., Disp: 60 tablet, Rfl: 0 .  B-D ULTRAFINE III SHORT PEN 31G X 8 MM MISC, USE AS DIRECTED, Disp: 100 each, Rfl: 3 .  Cyanocobalamin (RA VITAMIN B-12 TR) 1000 MCG TBCR, Take 1,000 mcg by mouth daily. , Disp: , Rfl:  .  feeding supplement, GLUCERNA SHAKE, (GLUCERNA SHAKE) LIQD, Take 237 mLs by mouth 3 (three) times daily between meals., Disp: 21330 mL, Rfl: 0 .  ferrous sulfate 325 (65 FE) MG tablet, Take 325 mg by mouth daily with breakfast., Disp: , Rfl:  .  gabapentin (NEURONTIN) 100 MG capsule, Take 1 capsule (100 mg total) by mouth at bedtime., Disp:  , Rfl:  .  Insulin Glargine (BASAGLAR KWIKPEN) 100 UNIT/ML SOPN, Inject 0.12 mLs (12 Units total) into the skin daily., Disp: 15 mL, Rfl: 3 .   melatonin 5 MG TABS, Take 0.5 tablets (2.5 mg total) by mouth at bedtime as needed (sleep)., Disp: 30 tablet, Rfl: 0 .  metoprolol tartrate (LOPRESSOR) 25 MG tablet, Take 1 tablet (25 mg total) by mouth 2 (two) times daily., Disp: 60 tablet, Rfl: 0 .  Multiple Vitamins-Minerals (PRESERVISION AREDS PO), Take 1 capsule by mouth 2 (two) times daily. , Disp: , Rfl:  .  rosuvastatin (CRESTOR) 5 MG tablet, Take 5 mg by mouth every other day. Mon, Wed, Fri and Sat., Disp: , Rfl:  .  torsemide (DEMADEX) 20 MG tablet, Take 2 tablets (40 mg total) by mouth daily., Disp: 30 tablet, Rfl: 0 .  baclofen (LIORESAL) 10 MG tablet, Take 1 tablet (10 mg total) by mouth daily as needed. (Patient not taking: Reported on 02/28/2020), Disp: , Rfl:  .  insulin aspart (NOVOLOG FLEXPEN) 100 UNIT/ML FlexPen, Inject 3 Units into the skin 3 (three) times daily with meals. At lunch (Patient taking differently: Inject 2 Units into the skin daily. At breakfast),  Disp: 15 mL, Rfl: 3 .  nitroGLYCERIN (NITROSTAT) 0.4 MG SL tablet, 1 tab under the tongue for CP, may repeat in 5 minutes up to 3 doses (Patient not taking: Reported on 02/28/2020), Disp: 25 tablet, Rfl: 5 .  pantoprazole (PROTONIX) 40 MG tablet, Take 1 tablet (40 mg total) by mouth daily. (Patient not taking: Reported on 02/18/2020), Disp: 30 tablet, Rfl: 0 .  polyethylene glycol (MIRALAX / GLYCOLAX) 17 g packet, Take 17 g by mouth daily as needed for moderate constipation. (Patient not taking: Reported on 02/28/2020), Disp: 14 each, Rfl: 0 .  Tiotropium Bromide-Olodaterol 2.5-2.5 MCG/ACT AERS, Inhale 2 puffs into the lungs daily. (Patient not taking: Reported on 02/28/2020), Disp: 4 g, Rfl: 0   COUNSELING POINTS/CLINICAL PEARLS  Carvedilol (Goal: weight less than 85 kg is 25 mg BID, weight greater than 85 kg is 50 mg BID)  Patient should avoid activities requiring coordination until drug effects are realized, as drug may cause dizziness.  This drug may cause diarrhea, nausea,  vomiting, arthralgia, back pain, myalgia, headache, vision disorder, erectile dysfunction, reduced libido, or fatigue.  Instruct patient to report signs/symptoms of adverse cardiovascular effects such as hypotension (especially in elderly patients), arrhythmias, syncope, palpitations, angina, or edema.  Drug may mask symptoms of hypoglycemia. Advise diabetic patients to carefully monitor blood sugar levels.  Patient should take drug with food.  Advise patient against sudden discontinuation of drug. Bisoprolol (Goal: 10 mg once daily) Patient should avoid activities requiring mental alertness or coordination until drug effects are realized.  This drug may cause bradyarrhythmia, cold extremities, hypotension, dyspepsia, dizziness, headache, dyssomnia, upper respiratory infection, or fatigue.  Drug may mask symptoms of hypoglycemia. Advise diabetic patients to carefully monitor blood sugar levels.  Advise patient against sudden discontinuation of drug.  Instruct patient to take a missed dose as soon as possible, but if next dose is in less than 8 h, skip the missed dose. Metoprolol Succinate (Goal: 200 mg once daily) Warn patient to avoid activities requiring mental alertness or coordination until drug effects are realized, as drug may cause dizziness. Tell patient planning major surgery with anesthesia to alert physician that drug is being used, as drug impairs ability of heart to respond to reflex adrenergic stimuli. Drug may cause diarrhea, fatigue, headache, or depression. Advise diabetic patient to carefully monitor blood glucose as drug may mask symptoms of hypoglycemia. Patient should take extended-release tablet with or immediately following meals. Counsel patient against sudden discontinuation of drug, as this may precipitate hypertension, angina, or myocardial infarction. In the event of a missed dose, counsel patient to skip the missed dose and maintain a regular dosing  schedule. Lisinopril (Goal: 20 to 40 mg once daily)  This drug may cause nausea, vomiting, dizziness, headache, or angioedema of face, lips, throat, or intestines.  Instruct patient to report signs/symptoms of hypotension, or a persistent cough.  Advise patient against sudden discontinuation of drug. Enalapril (Goal: 10 to 20 mg mg twice daily)  Patient should avoid activities requiring mental alertness or coordination until drug effects are realized.  Instruct patient to rise slowly from a sitting/supine position, as drug may cause orthostatic hypotension.  This drug may cause nausea, vomiting, diarrhea, fatigue, rash, dizziness, headache, or asthenia.  Instruct patient to report signs/symptoms of hypotension (lightheadedness or syncope) or persistent cough.  Tell patient to report symptoms of angioedema (swelling of face, extremities, eyes, lips, or tongue, or difficulty in swallowing or breathing) or intestinal angioedema (abdominal pain).  Instruct patient to report  symptoms of hepatic failure (jaundice) or acute renal failure.  Advise patient to maintain adequate hydration during therapy to prevent volume depletion and an excessive fall in blood pressure.  Instruct patient to immediately report signs/symptoms of infection (sore throat or fever).  Instruct patients/caregivers on the preparation method for the oral solution or suspension and inform them of the expiration date following reconstitution.  Patient should avoid use of potassium-sparing diuretics or potassium-containing supplements or salt substitutes without first consulting their healthcare provider, as the drug may cause increased potassium levels. Losartan (Goal: 150 mg once daily)  Warn female patient to avoid pregnancy and to report a pregnancy that occurs during therapy.  Side effects may include dizziness, upper respiratory infection, nasal congestion, and back pain.  Warn patient to avoid use of potassium supplements or  potassium-containing salt substitutes unless they consult healthcare provider. Valsartan (Goal: 160 mg twice daily)  Advise patient to report lightheadedness or syncope.  Tell patient to avoid activities requiring coordination until drug effects are realized, as this medicine may cause dizziness.  Side effects may include abdominal pain, diarrhea, hypotension, headache, cough, or fatigue.  Advise patient to avoid potassium supplements and foods/salt substitutes that are high in potassium. Entresto (Goal: 97/103 mg twice daily)  Warn female patient to avoid pregnancy during therapy and to report a pregnancy to a physician.  Advise patient to report symptomatic hypotension.  Side effects may include hyperkalemia, cough, dizziness, or renal failure. Furosemide  Drug causes sun-sensitivity. Advise patient to use sunscreen and avoid tanning beds. Patient should avoid activities requiring coordination until drug effects are realized, as drug may cause dizziness, vertigo, or blurred vision. This drug may cause hyperglycemia, hyperuricemia, constipation, diarrhea, loss of appetite, nausea, vomiting, purpuric disorder, cramps, spasticity, asthenia, headache, paresthesia, or scaling eczema. Instruct patient to report unusual bleeding/bruising or signs/symptoms of hypotension, infection, pancreatitis, or ototoxicity (tinnitus, hearing impairment). Advise patient to report signs/symptoms of a severe skin reactions (flu-like symptoms, spreading red rash, or skin/mucous membrane blistering) or erythema multiforme. Instruct patient to eat high-potassium foods during drug therapy, as directed by healthcare professional.  Patient should not drink alcohol while taking this drug. Torsemide  Side effects may include excessive urination.  Tell patient to report symptoms of ototoxicity.  Instruct patient to report lightheadedness or syncope.  Warn patient to avoid use of nonprescription NSAID products without first  discussing it with their healthcare provider. Spironolactone  Warn patient to report dehydration, hypotension, or symptoms of worsening renal function.  Counsel female patient to report gynecomastia.  Side effects may include diarrhea, nausea, vomiting, abdominal cramping, fever, leg cramps, lethargy, mental confusion, decreased libido, irregular menses, and rash. Suspension: Tell patient to take drug consistently with respect to food, either before or after a meal.  Advise patient to avoid potassium supplements and foods containing high levels of potassium, including salt substitutes. Eplerenone  Patient should avoid activities requiring coordination until drug effects are realized, as drug may cause dizziness.  This drug may cause diarrhea, headache, cough, fatigue, influenza-like illness, angina, or myocardial infarction.  Patient should avoid potassium supplements, potassium-containing salt substitutes and other potassium-sparing diuretics during therapy.  DRUGS TO AVOID IN HEART FAILURE  Drug or Class Mechanism  Analgesics . NSAIDs . COX-2 inhibitors . Glucocorticoids  Sodium and water retention, increased systemic vascular resistance, decreased response to diuretics   Diabetes Medications . Metformin . Thiazolidinediones o Rosiglitazone ([place]) o Pioglitazone (Actos) . DPP4 Inhibitors o Saxagliptin (Onglyza) o Sitagliptin (Januvia)   Lactic acidosis Possible calcium  channel blockade   Unknown  Antiarrhythmics . Class I  o Flecainide o Disopyramide . Class III o Sotalol . Other o Dronedarone  Negative inotrope, proarrhythmic   Proarrhythmic, beta blockade  Negative inotrope  Antihypertensives . Alpha Blockers o Doxazosin . Calcium Channel Blockers o Diltiazem o Verapamil o Nifedipine . Central Alpha Adrenergics o Moxonidine . Peripheral Vasodilators o Minoxidil  Increases renin and aldosterone  Negative inotrope    Possible sympathetic  withdrawal  Unknown  Anti-infective . Itraconazole . Amphotericin B  Negative inotrope Unknown  Hematologic . Anagrelide . Cilostazol   Possible inhibition of PD IV Inhibition of PD III causing arrhythmias  Neurologic/Psychiatric . Stimulants . Anti-Seizure Drugs o Carbamazepine o Pregabalin . Antidepressants o Tricyclics o Citalopram . Parkinsons o Bromocriptine o Pergolide o Pramipexole . Antipsychotics o Clozapine . Antimigraine o Ergotamine o Methysergide . Appetite suppressants . Bipolar o Lithium  Peripheral alpha and beta agonist activity  Negative inotrope and chronotrope Calcium channel blockade  Negative inotrope, proarrhythmic Dose-dependent QT prolongation  Excessive serotonin activity/valvular damage Excessive serotonin activity/valvular damage Unknown  IgE mediated hypersensitivy, calcium channel blockade  Excessive serotonin activity/valvular damage Excessive serotonin activity/valvular damage Valvular damage  Direct myofibrillar degeneration, adrenergic stimulation  Antimalarials . Chloroquine . Hydroxychloroquine Intracellular inhibition of lysosomal enzymes  Urologic Agents . Alpha Blockers o Doxazosin o Prazosin o Tamsulosin o Terazosin  Increased renin and aldosterone  Adapted from Page RL, et al. "Drugs That May Cause or Exacerbate Heart Failure: A Scientific Statement from the [place]." Circulation 2016; 174:B44-H67. DOI: 10.1161/CIR.0000000000000426   MEDICATION ADHERENCES TIPS AND STRATEGIES 1. Taking medication as prescribed improves patient outcomes in heart failure (reduces hospitalizations, improves symptoms, increases survival) 2. Side effects of medications can be managed by decreasing doses, switching agents, stopping drugs, or adding additional therapy. Please let someone in the [facility] know if you have having bothersome side effects so we can modify your regimen. Do not alter your  medication regimen without talking to [place].  3. Medication reminders can help patients remember to take drugs on time. If you are missing or forgetting doses you can try linking behaviors, using pill boxes, or an electronic reminder like an alarm on your phone or an app. Some people can also get automated phone calls as medication reminders.

## 2020-03-01 ENCOUNTER — Ambulatory Visit: Payer: Self-pay | Admitting: General Practice

## 2020-03-01 NOTE — Chronic Care Management (AMB) (Signed)
  Chronic Care Management   Outreach Note  03/01/2020 Name: Jill Hudson MRN: [id number] DOB: 07-22-1935  Referred by: [name], MD Reason for referral : Chronic Care Management (Follow up for post discharge from hospital on 02-26-2020 and other chronic conditions )   An unsuccessful telephone outreach was attempted today. The patient was referred to the case management team for assistance with care management and care coordination.   Follow Up Plan: A HIPPA compliant phone message was left for the patient providing contact information and requesting a return call.   Noreene Larsson RN, MSN, [facility] Mobile: [phone]

## 2020-03-02 DIAGNOSIS — E1151 Type 2 diabetes mellitus with diabetic peripheral angiopathy without gangrene: Secondary | ICD-10-CM | POA: Diagnosis not present

## 2020-03-02 DIAGNOSIS — D509 Iron deficiency anemia, unspecified: Secondary | ICD-10-CM | POA: Diagnosis not present

## 2020-03-02 DIAGNOSIS — F419 Anxiety disorder, unspecified: Secondary | ICD-10-CM | POA: Diagnosis not present

## 2020-03-02 DIAGNOSIS — H3530 Unspecified macular degeneration: Secondary | ICD-10-CM | POA: Diagnosis not present

## 2020-03-02 DIAGNOSIS — I214 Non-ST elevation (NSTEMI) myocardial infarction: Secondary | ICD-10-CM | POA: Diagnosis not present

## 2020-03-02 DIAGNOSIS — I2510 Atherosclerotic heart disease of native coronary artery without angina pectoris: Secondary | ICD-10-CM | POA: Diagnosis not present

## 2020-03-02 DIAGNOSIS — K219 Gastro-esophageal reflux disease without esophagitis: Secondary | ICD-10-CM | POA: Diagnosis not present

## 2020-03-02 DIAGNOSIS — N1831 Chronic kidney disease, stage 3a: Secondary | ICD-10-CM | POA: Diagnosis not present

## 2020-03-02 DIAGNOSIS — I0981 Rheumatic heart failure: Secondary | ICD-10-CM | POA: Diagnosis not present

## 2020-03-02 DIAGNOSIS — I110 Hypertensive heart disease with heart failure: Secondary | ICD-10-CM | POA: Diagnosis not present

## 2020-03-02 DIAGNOSIS — N179 Acute kidney failure, unspecified: Secondary | ICD-10-CM | POA: Diagnosis not present

## 2020-03-02 DIAGNOSIS — M479 Spondylosis, unspecified: Secondary | ICD-10-CM | POA: Diagnosis not present

## 2020-03-02 DIAGNOSIS — E1122 Type 2 diabetes mellitus with diabetic chronic kidney disease: Secondary | ICD-10-CM | POA: Diagnosis not present

## 2020-03-02 DIAGNOSIS — R011 Cardiac murmur, unspecified: Secondary | ICD-10-CM | POA: Diagnosis not present

## 2020-03-02 DIAGNOSIS — I70229 Atherosclerosis of native arteries of extremities with rest pain, unspecified extremity: Secondary | ICD-10-CM | POA: Diagnosis not present

## 2020-03-02 DIAGNOSIS — I080 Rheumatic disorders of both mitral and aortic valves: Secondary | ICD-10-CM | POA: Diagnosis not present

## 2020-03-02 DIAGNOSIS — M19019 Primary osteoarthritis, unspecified shoulder: Secondary | ICD-10-CM | POA: Diagnosis not present

## 2020-03-02 DIAGNOSIS — J9601 Acute respiratory failure with hypoxia: Secondary | ICD-10-CM | POA: Diagnosis not present

## 2020-03-02 DIAGNOSIS — R251 Tremor, unspecified: Secondary | ICD-10-CM | POA: Diagnosis not present

## 2020-03-02 DIAGNOSIS — J441 Chronic obstructive pulmonary disease with (acute) exacerbation: Secondary | ICD-10-CM | POA: Diagnosis not present

## 2020-03-02 DIAGNOSIS — I480 Paroxysmal atrial fibrillation: Secondary | ICD-10-CM | POA: Diagnosis not present

## 2020-03-02 DIAGNOSIS — I5033 Acute on chronic diastolic (congestive) heart failure: Secondary | ICD-10-CM | POA: Diagnosis not present

## 2020-03-02 DIAGNOSIS — E785 Hyperlipidemia, unspecified: Secondary | ICD-10-CM | POA: Diagnosis not present

## 2020-03-02 DIAGNOSIS — B370 Candidal stomatitis: Secondary | ICD-10-CM | POA: Diagnosis not present

## 2020-03-02 DIAGNOSIS — E871 Hypo-osmolality and hyponatremia: Secondary | ICD-10-CM | POA: Diagnosis not present

## 2020-03-04 ENCOUNTER — Telehealth: Payer: Self-pay | Admitting: Internal Medicine

## 2020-03-04 NOTE — Telephone Encounter (Signed)
[place]   Caller/Agency: [facility] Doctor Number: [phone] Requesting OT/PT/Skilled Nursing/Social Work/Speech Therapy: Home Health  Frequency: 2w2 1w7 3PRN

## 2020-03-05 DIAGNOSIS — I2510 Atherosclerotic heart disease of native coronary artery without angina pectoris: Secondary | ICD-10-CM | POA: Diagnosis not present

## 2020-03-05 DIAGNOSIS — I110 Hypertensive heart disease with heart failure: Secondary | ICD-10-CM | POA: Diagnosis not present

## 2020-03-05 DIAGNOSIS — J441 Chronic obstructive pulmonary disease with (acute) exacerbation: Secondary | ICD-10-CM | POA: Diagnosis not present

## 2020-03-05 DIAGNOSIS — J9601 Acute respiratory failure with hypoxia: Secondary | ICD-10-CM | POA: Diagnosis not present

## 2020-03-05 DIAGNOSIS — I0981 Rheumatic heart failure: Secondary | ICD-10-CM | POA: Diagnosis not present

## 2020-03-05 DIAGNOSIS — I214 Non-ST elevation (NSTEMI) myocardial infarction: Secondary | ICD-10-CM | POA: Diagnosis not present

## 2020-03-05 NOTE — Telephone Encounter (Signed)
Gave verbal orders for Dr. Army Melia to Cecille Rubin.  CM

## 2020-03-06 ENCOUNTER — Telehealth: Payer: Self-pay | Admitting: Internal Medicine

## 2020-03-06 DIAGNOSIS — I110 Hypertensive heart disease with heart failure: Secondary | ICD-10-CM | POA: Diagnosis not present

## 2020-03-06 DIAGNOSIS — I2510 Atherosclerotic heart disease of native coronary artery without angina pectoris: Secondary | ICD-10-CM | POA: Diagnosis not present

## 2020-03-06 DIAGNOSIS — J441 Chronic obstructive pulmonary disease with (acute) exacerbation: Secondary | ICD-10-CM | POA: Diagnosis not present

## 2020-03-06 DIAGNOSIS — I0981 Rheumatic heart failure: Secondary | ICD-10-CM | POA: Diagnosis not present

## 2020-03-06 DIAGNOSIS — J9601 Acute respiratory failure with hypoxia: Secondary | ICD-10-CM | POA: Diagnosis not present

## 2020-03-06 DIAGNOSIS — I214 Non-ST elevation (NSTEMI) myocardial infarction: Secondary | ICD-10-CM | POA: Diagnosis not present

## 2020-03-06 NOTE — Telephone Encounter (Signed)
Jill Hudson, PT with [facility], calling to request VO for PT.   Frequency: 2w3, 1w5  CB# [phone]

## 2020-03-06 NOTE — Telephone Encounter (Signed)
Verbal orders given.  CM

## 2020-03-07 DIAGNOSIS — N1832 Chronic kidney disease, stage 3b: Secondary | ICD-10-CM | POA: Diagnosis not present

## 2020-03-07 DIAGNOSIS — I10 Essential (primary) hypertension: Secondary | ICD-10-CM | POA: Diagnosis not present

## 2020-03-07 DIAGNOSIS — I5030 Unspecified diastolic (congestive) heart failure: Secondary | ICD-10-CM | POA: Diagnosis not present

## 2020-03-07 DIAGNOSIS — N179 Acute kidney failure, unspecified: Secondary | ICD-10-CM | POA: Diagnosis not present

## 2020-03-07 DIAGNOSIS — I480 Paroxysmal atrial fibrillation: Secondary | ICD-10-CM | POA: Diagnosis not present

## 2020-03-07 DIAGNOSIS — I2510 Atherosclerotic heart disease of native coronary artery without angina pectoris: Secondary | ICD-10-CM | POA: Diagnosis not present

## 2020-03-08 ENCOUNTER — Ambulatory Visit: Payer: Self-pay | Admitting: General Practice

## 2020-03-08 DIAGNOSIS — J9601 Acute respiratory failure with hypoxia: Secondary | ICD-10-CM | POA: Diagnosis not present

## 2020-03-08 DIAGNOSIS — I0981 Rheumatic heart failure: Secondary | ICD-10-CM | POA: Diagnosis not present

## 2020-03-08 DIAGNOSIS — E782 Mixed hyperlipidemia: Secondary | ICD-10-CM

## 2020-03-08 DIAGNOSIS — I110 Hypertensive heart disease with heart failure: Secondary | ICD-10-CM | POA: Diagnosis not present

## 2020-03-08 DIAGNOSIS — I5030 Unspecified diastolic (congestive) heart failure: Secondary | ICD-10-CM

## 2020-03-08 DIAGNOSIS — E1122 Type 2 diabetes mellitus with diabetic chronic kidney disease: Secondary | ICD-10-CM

## 2020-03-08 DIAGNOSIS — N183 Chronic kidney disease, stage 3 unspecified: Secondary | ICD-10-CM

## 2020-03-08 DIAGNOSIS — I10 Essential (primary) hypertension: Secondary | ICD-10-CM

## 2020-03-08 DIAGNOSIS — E118 Type 2 diabetes mellitus with unspecified complications: Secondary | ICD-10-CM

## 2020-03-08 DIAGNOSIS — I214 Non-ST elevation (NSTEMI) myocardial infarction: Secondary | ICD-10-CM | POA: Diagnosis not present

## 2020-03-08 DIAGNOSIS — J441 Chronic obstructive pulmonary disease with (acute) exacerbation: Secondary | ICD-10-CM | POA: Diagnosis not present

## 2020-03-08 DIAGNOSIS — I2510 Atherosclerotic heart disease of native coronary artery without angina pectoris: Secondary | ICD-10-CM | POA: Diagnosis not present

## 2020-03-08 NOTE — Chronic Care Management (AMB) (Signed)
Chronic Care Management   Follow Up Note   03/08/2020 Name: Jill Hudson MRN: [id number] DOB: 06-27-1935  Referred by: [name], MD Reason for referral : Chronic Care Management (Post discharge 02/26/2020- RNCM Chronic Disease management and Care coordination needs )   Jill Hudson is a 85 y.o. year old female who is a primary care patient of Jill Melia Jesse Sans, MD. The CCM team was consulted for assistance with chronic disease management and care coordination needs.    Review of patient status, including review of consultants reports, relevant laboratory and other test results, and collaboration with appropriate care team members and the patient's provider was performed as part of comprehensive patient evaluation and provision of chronic care management services.    SDOH (Social Determinants of Health) assessments performed: Yes See Care Plan activities for detailed interventions related to SDOH)  SDOH Interventions     Most Recent Value  SDOH Interventions  SDOH Interventions for the Following Domains  Physical Activity  Physical Activity Interventions  Other (Comments) [the patient is working with pt walking. She can walk with and without the walker. Doing well]       Outpatient Encounter Medications as of 03/08/2020  Medication Sig Note   acetaminophen (TYLENOL) 650 MG CR tablet Take 1,300 mg by mouth every 8 (eight) hours as needed for pain.    albuterol (VENTOLIN HFA) 108 (90 Base) MCG/ACT inhaler Inhale 2 puffs into the lungs every 6 (six) hours as needed for wheezing or shortness of breath.    apixaban (ELIQUIS) 2.5 MG TABS tablet Take 1 tablet (2.5 mg total) by mouth 2 (two) times daily.    B-D ULTRAFINE III SHORT PEN 31G X 8 MM MISC USE AS DIRECTED    Cyanocobalamin (RA VITAMIN B-12 TR) 1000 MCG TBCR Take 1,000 mcg by mouth daily.     feeding supplement, GLUCERNA SHAKE, (GLUCERNA SHAKE) LIQD Take 237 mLs by mouth 3 (three) times daily between meals.    ferrous  sulfate 325 (65 FE) MG tablet Take 325 mg by mouth daily with breakfast.    gabapentin (NEURONTIN) 100 MG capsule Take 1 capsule (100 mg total) by mouth at bedtime.    insulin aspart (NOVOLOG FLEXPEN) 100 UNIT/ML FlexPen Inject 3 Units into the skin 3 (three) times daily with meals. At lunch (Patient taking differently: Inject 2 Units into the skin daily. At breakfast) 02/28/2020: 3 units TIDAC   Insulin Glargine (BASAGLAR KWIKPEN) 100 UNIT/ML SOPN Inject 0.12 mLs (12 Units total) into the skin daily.    melatonin 5 MG TABS Take 0.5 tablets (2.5 mg total) by mouth at bedtime as needed (sleep).    metoprolol tartrate (LOPRESSOR) 25 MG tablet Take 1 tablet (25 mg total) by mouth 2 (two) times daily.    Multiple Vitamins-Minerals (PRESERVISION AREDS PO) Take 1 capsule by mouth 2 (two) times daily.     rosuvastatin (CRESTOR) 5 MG tablet Take 5 mg by mouth every other day. Mon, Wed, Fri and Sat.    Tiotropium Bromide-Olodaterol 2.5-2.5 MCG/ACT AERS Inhale 2 puffs into the lungs daily. (Patient not taking: Reported on 02/28/2020) 02/28/2020: Has not been able to fill yet   torsemide (DEMADEX) 20 MG tablet Take 2 tablets (40 mg total) by mouth daily.    No facility-administered encounter medications on file as of 03/08/2020.     Objective:  BP Readings from Last 3 Encounters:  03/08/20 (!) 125/45  02/28/20 125/82  02/26/20 (!) 105/55   Wt Readings from Last 3  Encounters:  03/08/20 113 lb (51.3 kg)  02/28/20 114 lb (51.7 kg)  02/26/20 117 lb 9.6 oz (53.3 kg)    Goals Addressed            This Visit's Progress    COMPLETED: RNCM: Pt-"I had to get blood" (pt-stated)       CARE PLAN ENTRY (see longtitudinal plan of care for additional care plan information)   Current Barriers:   Knowledge Deficits related to recent hospitalization for acute onset of of GI Bleed as evidence of acute blood loss anemia, diverticular bleed (hospitalization 12/25/2019 to 12/28/2019)  Nurse Case Manager  Clinical Goal(s):   Over the next 60 days, patient will work with [facility] and pcp  to address needs related to GI bleed and preventing readmit for acute blood loss/diverticular bleed  Over the next 120 days, patient will demonstrate a decrease in GI bleed  exacerbations as evidenced by no new issues with acute blood loss   Over the next 60 days, patient will attend all scheduled medical appointments: Next appointment with Dr. Army Melia for post discharge follow up on 01/08/2020  Over the next 120 days, the patient will demonstrate ongoing self health care management ability as evidenced by following recommended plan of care established by the pcp, and  calling for changes in condition.  Interventions:   Evaluation of current treatment plan related to GI bleed and review of post discharge instrcutions and patient's adherence to plan as established by provider.  Advised patient to call the home health agency if she has not heard from them by tomorrow am, the patient has the number and knows how to reach  Provided education to patient re: diverticulitis and foods to avoid to prevent further complications   Reviewed medications with patient and discussed compliance- per the patient she spoke to Dr. Army Melia today concerning resuming some of her medications that had been stopped. Knows to call for any questions or concerns  Discussed plans with patient for ongoing care management follow up and provided patient with direct contact information for care management team  Provided patient with dietary educational materials related to diverticular issues and foods to avoid by the myChart system  Reviewed scheduled/upcoming provider appointments including: upcoming post discharge appointment on 01/08/2020 with pcp  Patient Self Care Activities:   Patient verbalizes understanding of plan to call home health to validate schedule and to call CCM team or pcp as needed for assistance   Attends all scheduled  provider appointments  Calls provider office for new concerns or questions  Unable to independently manage diverticulitis as evidence by recent hospitalization due to acute blood loss anemia, diverticular bleed  Initial goal documentation      RNCM: Pt-"I have to watch my weight closely" (pt-stated)       [place] (see longitudinal plan of care for additional care plan information)  Current Barriers:   Knowledge deficit related to basic heart failure pathophysiology and self care management  Post discharge from the hospital on 02-26-2020 for patient stated "too much water in me"   Nurse Case Manager Clinical Goal(s):   Over the next 120 days, patient will weigh self daily and record  Over the next 120 days, patient will verbalize understanding of Heart Failure Action Plan and when to call doctor  Over the next 120 days, patient will take all Heart Failure mediations as prescribed  Interventions:   Basic overview and discussion of pathophysiology of Heart Failure  Provided written and verbal education  on low sodium diet.  Will send information by My Chart and EMMI about HF and how to prevent exacerbation. The patient endorses a heart healthy/ADA diet.  She is sad that she can not eat any more hot dogs; however she was able to tell the RNCM healthy food choices to maintain her health and well being with HF/HTN/HLD/DM.  Review how eating salt causes the water to follow in the body. The patient reported cough and swelling in her left leg.   Reviewed Heart Failure Action Plan in depth.  The patient verbalized she is to call her provider if her weight is less than 113 or greater than 116.  She had follow up with the cardiologist yesterday. She said the cardiology is pleased with her post hospital evaluation. The patient is also taking her blood pressure daily.    Assessed for scales in home.  Patient endorses working scales  Discussed importance of daily weight.  Education on  weighing first thing every am after first void, with similar clothes for most accurate weight.  Have scales on flat surface.  Call the provider for outside given parameters of wt <113 or >116. Education given to patient on  the importance of weighing daily so subtle changes can be picked up on and intervention made if needed by the provider.   Reviewed role of diuretics in prevention of fluid overload.  Education on taking medications as directed. Reviewed safety when taking diuretics.   Evaluation of other post discharge needs. The patient verbalized understanding of discharge instructions. Has seen her cardiologist post discharge. Has an appointment with pcp on 03-18-2020.  The patient also has an eye appointment on 03-11-2020.  Patient Self Care Activities:   Take Heart Failure Medications as prescribed  Weigh daily and record (notify MD with 3 lb weight gain over night or 5 lb in a week) Per the patient the MD wants her weight range to be 113-116.  Follow CHF Action Plan  Adhere to low sodium diet  Initial goal documentation      RNCM: Pt-"My blood pressure was high this am so I called the doctor"       [place] (see longtitudinal plan of care for additional care plan information)  Current Barriers:   Chronic Disease Management support, education, and care coordination needs related to HTN, HLD, DMII, and CKD Stage 3  Clinical Goal(s) related to HTN, HLD, DMII, and CKD Stage 3 :  Over the next 120 days, patient will:   Work with the care management team to address educational, disease management, and care coordination needs   Begin or continue self health monitoring activities as directed today Measure and record cbg (blood glucose) 3 times daily, Measure and record blood pressure 5 times per week, and adhere to a Heart healthy/ADA diet  Call provider office for new or worsened signs and symptoms Blood glucose findings outside established parameters, Blood pressure findings  outside established parameters, and New or worsened symptom related to CKD3 and HLD  Call care management team with questions or concerns  Verbalize basic understanding of patient centered plan of care established today  Interventions related to HTN, HLD, DMII, and CKD Stage 3 :   Evaluation of current treatment plans and patient's adherence to plan as established by provider  Assessed patient understanding of disease states.  The patient verbalized understanding of chronic conditions and endorses compliance with the plan of care  Assessed patient's education and care coordination needs.  Review of blood  sugar readings. The patient verbalized fast this am was 145.  Review of blood pressure this am and her reading was 125/45- denies any issues with orthostatic hypotension at this time. Reviewed safety when ambulating.   Provided disease specific education to patient- review of Heart healthy/ADA diet, discussed special consideration due to recent GI Bleed and patient was told to increase fiber.  Collaborated with appropriate clinical care team members regarding patient needs.  Denies needs from the CCM pharmacist or LCSW at this time.   Patient Self Care Activities related to HTN, HLD, DMII, and CKD Stage 3 :   Patient is unable to independently self-manage chronic health conditions  Please see past updates related to this goal by clicking on the "Past Updates" button in the selected goal          Plan:   The care management team will reach out to the patient again over the next 60 to 90  days.    Noreene Larsson RN, MSN, [facility] Mobile: [phone]

## 2020-03-08 NOTE — Patient Instructions (Signed)
Visit Information  Goals Addressed            This Visit's Progress   . COMPLETED: RNCM: Pt-"I had to get blood" (pt-stated)       CARE PLAN ENTRY (see longtitudinal plan of care for additional care plan information)   Current Barriers:  Marland Kitchen Knowledge Deficits related to recent hospitalization for acute onset of of GI Bleed as evidence of acute blood loss anemia, diverticular bleed (hospitalization 12/25/2019 to 12/28/2019)  Nurse Case Manager Clinical Goal(s):  Marland Kitchen Over the next 60 days, patient will work with [facility] and pcp  to address needs related to GI bleed and preventing readmit for acute blood loss/diverticular bleed . Over the next 120 days, patient will demonstrate a decrease in GI bleed  exacerbations as evidenced by no new issues with acute blood loss  . Over the next 60 days, patient will attend all scheduled medical appointments: Next appointment with Dr. Army Melia for post discharge follow up on 01/08/2020 . Over the next 120 days, the patient will demonstrate ongoing self health care management ability as evidenced by following recommended plan of care established by the pcp, and  calling for changes in condition.  Interventions:  . Evaluation of current treatment plan related to GI bleed and review of post discharge instrcutions and patient's adherence to plan as established by provider. . Advised patient to call the home health agency if she has not heard from them by tomorrow am, the patient has the number and knows how to reach . Provided education to patient re: diverticulitis and foods to avoid to prevent further complications  . Reviewed medications with patient and discussed compliance- per the patient she spoke to Dr. Army Melia today concerning resuming some of her medications that had been stopped. Knows to call for any questions or concerns . Discussed plans with patient for ongoing care management follow up and provided patient with direct contact information for care  management team . Provided patient with dietary educational materials related to diverticular issues and foods to avoid by the myChart system . Reviewed scheduled/upcoming provider appointments including: upcoming post discharge appointment on 01/08/2020 with pcp  Patient Self Care Activities:  . Patient verbalizes understanding of plan to call home health to validate schedule and to call CCM team or pcp as needed for assistance  . Attends all scheduled provider appointments . Calls provider office for new concerns or questions . Unable to independently manage diverticulitis as evidence by recent hospitalization due to acute blood loss anemia, diverticular bleed  Initial goal documentation     . RNCM: Pt-"I have to watch my weight closely" (pt-stated)       CARE PLAN ENTRY (see longitudinal plan of care for additional care plan information)  Current Barriers:  Marland Kitchen Knowledge deficit related to basic heart failure pathophysiology and self care management . Post discharge from the hospital on 02-26-2020 for patient stated "too much water in me"   Nurse Case Manager Clinical Goal(s):   Over the next 120 days, patient will weigh self daily and record  Over the next 120 days, patient will verbalize understanding of Heart Failure Action Plan and when to call doctor  Over the next 120 days, patient will take all Heart Failure mediations as prescribed  Interventions:  . Basic overview and discussion of pathophysiology of Heart Failure . Provided written and verbal education on low sodium diet.  Will send information by My Chart and EMMI about HF and how to prevent exacerbation. The patient  endorses a heart healthy/ADA diet.  She is sad that she can not eat any more hot dogs; however she was able to tell the RNCM healthy food choices to maintain her health and well being with HF/HTN/HLD/DM.  Review how eating salt causes the water to follow in the body. The patient reported cough and swelling in her  left leg.  . Reviewed Heart Failure Action Plan in depth.  The patient verbalized she is to call her provider if her weight is less than 113 or greater than 116.  She had follow up with the cardiologist yesterday. She said the cardiology is pleased with her post hospital evaluation. The patient is also taking her blood pressure daily.   . Assessed for scales in home.  Patient endorses working scales . Discussed importance of daily weight.  Education on weighing first thing every am after first void, with similar clothes for most accurate weight.  Have scales on flat surface.  Call the provider for outside given parameters of wt <113 or >116. Education given to patient on  the importance of weighing daily so subtle changes can be picked up on and intervention made if needed by the provider.  . Reviewed role of diuretics in prevention of fluid overload.  Education on taking medications as directed. Reviewed safety when taking diuretics.  . Evaluation of other post discharge needs. The patient verbalized understanding of discharge instructions. Has seen her cardiologist post discharge. Has an appointment with pcp on 03-18-2020.  The patient also has an eye appointment on 03-11-2020.  Patient Self Care Activities:  . Take Heart Failure Medications as prescribed . Weigh daily and record (notify MD with 3 lb weight gain over night or 5 lb in a week) Per the patient the MD wants her weight range to be 113-116. . Follow CHF Action Plan . Adhere to low sodium diet  Initial goal documentation     . RNCM: Pt-"My blood pressure was high this am so I called the doctor"       [place] (see longtitudinal plan of care for additional care plan information)  Current Barriers:  . Chronic Disease Management support, education, and care coordination needs related to HTN, HLD, DMII, and CKD Stage 3  Clinical Goal(s) related to HTN, HLD, DMII, and CKD Stage 3 :  Over the next 120 days, patient will:  . Work  with the care management team to address educational, disease management, and care coordination needs  . Begin or continue self health monitoring activities as directed today Measure and record cbg (blood glucose) 3 times daily, Measure and record blood pressure 5 times per week, and adhere to a Heart healthy/ADA diet . Call provider office for new or worsened signs and symptoms Blood glucose findings outside established parameters, Blood pressure findings outside established parameters, and New or worsened symptom related to CKD3 and HLD . Call care management team with questions or concerns . Verbalize basic understanding of patient centered plan of care established today  Interventions related to HTN, HLD, DMII, and CKD Stage 3 :  . Evaluation of current treatment plans and patient's adherence to plan as established by provider . Assessed patient understanding of disease states.  The patient verbalized understanding of chronic conditions and endorses compliance with the plan of care . Assessed patient's education and care coordination needs. . Review of blood sugar readings. The patient verbalized fast this am was 145. Marland Kitchen Review of blood pressure this am and her reading was 125/45-  denies any issues with orthostatic hypotension at this time. Reviewed safety when ambulating.  . Provided disease specific education to patient- review of Heart healthy/ADA diet, discussed special consideration due to recent GI Bleed and patient was told to increase fiber. [name] with appropriate clinical care team members regarding patient needs.  Denies needs from the CCM pharmacist or LCSW at this time.   Patient Self Care Activities related to HTN, HLD, DMII, and CKD Stage 3 :  . Patient is unable to independently self-manage chronic health conditions  Please see past updates related to this goal by clicking on the "Past Updates" button in the selected goal         Patient verbalizes understanding of  instructions provided today.   The care management team will reach out to the patient again over the next 60 to 90 days.   Noreene Larsson RN, MSN, [facility] Mobile: [phone]  Heart Failure Action Plan A heart failure action plan helps you understand what to do when you have symptoms of heart failure. Follow the plan that was created by you and your health care provider. Review your plan each time you visit your health care provider. Red zone These signs and symptoms mean you should get medical help right away:  You have trouble breathing when resting.  You have a dry cough that is getting worse.  You have swelling or pain in your legs or abdomen that is getting worse.  You suddenly gain more than 2-3 lb (0.9-1.4 kg) in a day, or more than 5 lb (2.3 kg) in one week. This amount may be more or less depending on your condition.  You have trouble staying awake or you feel confused.  You have chest pain.  You do not have an appetite.  You pass out. If you experience any of these symptoms:  Call your local emergency services (911 in the [place] right away or seek help at the emergency department of the nearest hospital. Yellow zone These signs and symptoms mean your condition may be getting worse and you should make some changes:  You have trouble breathing when you are active or you need to sleep with extra pillows.  You have swelling in your legs or abdomen.  You gain 2-3 lb (0.9-1.4 kg) in one day, or 5 lb (2.3 kg) in one week. This amount may be more or less depending on your condition.  You get tired easily.  You have trouble sleeping.  You have a dry cough. If you experience any of these symptoms:  Contact your health care provider within the next day.  Your health care provider may adjust your medicines. Green zone These signs mean you are doing well and can continue what you are  doing:  You do not have shortness of breath.  You have very little swelling or no new swelling.  Your weight is stable (no gain or loss).  You have a normal activity level.  You do not have chest pain or any other new symptoms. Follow these instructions at home:  Take over-the-counter and prescription medicines only as told by your health care provider.  Weigh yourself daily. Your target weight is __________ lb (__________ kg). ? Call your health care provider if you gain more than __________ lb (__________ kg) in a day, or more than __________ lb (__________ kg) in one week.  Eat a heart-healthy diet. Work with a diet and nutrition  specialist (dietitian) to create an eating plan that is best for you.  Keep all follow-up visits as told by your health care provider. This is important. Where to find more information  American Heart Association: www.heart.org Summary  Follow the action plan that was created by you and your health care provider.  Get help right away if you have any symptoms in the Red zone. This information is not intended to replace advice given to you by your health care provider. Make sure you discuss any questions you have with your health care provider. Document Revised: 09/24/2017 Document Reviewed: 11/21/2016 Elsevier Patient Education  2020 [place] for Diabetes Mellitus, Adult  Carbohydrate counting is a method of keeping track of how many carbohydrates you eat. Eating carbohydrates naturally increases the amount of sugar (glucose) in the blood. Counting how many carbohydrates you eat helps keep your blood glucose within normal limits, which helps you manage your diabetes (diabetes mellitus). It is important to know how many carbohydrates you can safely have in each meal. This is different for every person. A diet and nutrition specialist (registered dietitian) can help you make a meal plan and calculate how many carbohydrates you  should have at each meal and snack. Carbohydrates are found in the following foods:  Grains, such as breads and cereals.  Dried beans and soy products.  Starchy vegetables, such as potatoes, peas, and corn.  Fruit and fruit juices.  Milk and yogurt.  Sweets and snack foods, such as cake, cookies, candy, chips, and soft drinks. How do I count carbohydrates? There are two ways to count carbohydrates in food. You can use either of the methods or a combination of both. Reading "Nutrition Facts" on packaged food The "Nutrition Facts" list is included on the labels of almost all packaged foods and beverages in the [place] It includes:  The serving size.  Information about nutrients in each serving, including the grams (g) of carbohydrate per serving. To use the "Nutrition Facts":  Decide how many servings you will have.  Multiply the number of servings by the number of carbohydrates per serving.  The resulting number is the total amount of carbohydrates that you will be having. Learning standard serving sizes of other foods When you eat carbohydrate foods that are not packaged or do not include "Nutrition Facts" on the label, you need to measure the servings in order to count the amount of carbohydrates:  Measure the foods that you will eat with a food scale or measuring cup, if needed.  Decide how many standard-size servings you will eat.  Multiply the number of servings by 15. Most carbohydrate-rich foods have about 15 g of carbohydrates per serving. ? For example, if you eat 8 oz (170 g) of strawberries, you will have eaten 2 servings and 30 g of carbohydrates (2 servings x 15 g = 30 g).  For foods that have more than one food mixed, such as soups and casseroles, you must count the carbohydrates in each food that is included. The following list contains standard serving sizes of common carbohydrate-rich foods. Each of these servings has about 15 g of carbohydrates:   hamburger  bun or  English muffin.   oz (15 mL) syrup.   oz (14 g) jelly.  1 slice of bread.  1 six-inch tortilla.  3 oz (85 g) cooked rice or pasta.  4 oz (113 g) cooked dried beans.  4 oz (113 g) starchy vegetable, such as peas, corn, or  potatoes.  4 oz (113 g) hot cereal.  4 oz (113 g) mashed potatoes or  of a large baked potato.  4 oz (113 g) canned or frozen fruit.  4 oz (120 mL) fruit juice.  4-6 crackers.  6 chicken nuggets.  6 oz (170 g) unsweetened dry cereal.  6 oz (170 g) plain fat-free yogurt or yogurt sweetened with artificial sweeteners.  8 oz (240 mL) milk.  8 oz (170 g) fresh fruit or one small piece of fruit.  24 oz (680 g) popped popcorn. Example of carbohydrate counting Sample meal  3 oz (85 g) chicken breast.  6 oz (170 g) brown rice.  4 oz (113 g) corn.  8 oz (240 mL) milk.  8 oz (170 g) strawberries with sugar-free whipped topping. Carbohydrate calculation 1. Identify the foods that contain carbohydrates: ? Rice. ? Corn. ? Milk. ? Strawberries. 2. Calculate how many servings you have of each food: ? 2 servings rice. ? 1 serving corn. ? 1 serving milk. ? 1 serving strawberries. 3. Multiply each number of servings by 15 g: ? 2 servings rice x 15 g = 30 g. ? 1 serving corn x 15 g = 15 g. ? 1 serving milk x 15 g = 15 g. ? 1 serving strawberries x 15 g = 15 g. 4. Add together all of the amounts to find the total grams of carbohydrates eaten: ? 30 g + 15 g + 15 g + 15 g = 75 g of carbohydrates total. Summary  Carbohydrate counting is a method of keeping track of how many carbohydrates you eat.  Eating carbohydrates naturally increases the amount of sugar (glucose) in the blood.  Counting how many carbohydrates you eat helps keep your blood glucose within normal limits, which helps you manage your diabetes.  A diet and nutrition specialist (registered dietitian) can help you make a meal plan and calculate how many carbohydrates you  should have at each meal and snack. This information is not intended to replace advice given to you by your health care provider. Make sure you discuss any questions you have with your health care provider. Document Revised: 05/06/2017 Document Reviewed: 03/25/2016 Elsevier Patient Education  [place] DASH stands for "Dietary Approaches to Stop Hypertension." The DASH eating plan is a healthy eating plan that has been shown to reduce high blood pressure (hypertension). It may also reduce your risk for type 2 diabetes, heart disease, and stroke. The DASH eating plan may also help with weight loss. What are tips for following this plan?  General guidelines  Avoid eating more than 2,300 mg (milligrams) of salt (sodium) a day. If you have hypertension, you may need to reduce your sodium intake to 1,500 mg a day.  Limit alcohol intake to no more than 1 drink a day for nonpregnant women and 2 drinks a day for men. One drink equals 12 oz of beer, 5 oz of wine, or 1 oz of hard liquor.  Work with your health care provider to maintain a healthy body weight or to lose weight. Ask what an ideal weight is for you.  Get at least 30 minutes of exercise that causes your heart to beat faster (aerobic exercise) most days of the week. Activities may include walking, swimming, or biking.  Work with your health care provider or diet and nutrition specialist (dietitian) to adjust your eating plan to your individual calorie needs. Reading food labels   Check food labels  for the amount of sodium per serving. Choose foods with less than 5 percent of the Daily Value of sodium. Generally, foods with less than 300 mg of sodium per serving fit into this eating plan.  To find whole grains, look for the word "whole" as the first word in the ingredient list. Shopping  Buy products labeled as "low-sodium" or "no salt added."  Buy fresh foods. Avoid canned foods and premade or frozen  meals. Cooking  Avoid adding salt when cooking. Use salt-free seasonings or herbs instead of table salt or sea salt. Check with your health care provider or pharmacist before using salt substitutes.  Do not fry foods. Cook foods using healthy methods such as baking, boiling, grilling, and broiling instead.  Cook with heart-healthy oils, such as olive, canola, soybean, or sunflower oil. Meal planning  Eat a balanced diet that includes: ? 5 or more servings of fruits and vegetables each day. At each meal, try to fill half of your plate with fruits and vegetables. ? Up to 6-8 servings of whole grains each day. ? Less than 6 oz of lean meat, poultry, or fish each day. A 3-oz serving of meat is about the same size as a deck of cards. One egg equals 1 oz. ? 2 servings of low-fat dairy each day. ? A serving of nuts, seeds, or beans 5 times each week. ? Heart-healthy fats. Healthy fats called Omega-3 fatty acids are found in foods such as flaxseeds and coldwater fish, like sardines, salmon, and mackerel.  Limit how much you eat of the following: ? Canned or prepackaged foods. ? Food that is high in trans fat, such as fried foods. ? Food that is high in saturated fat, such as fatty meat. ? Sweets, desserts, sugary drinks, and other foods with added sugar. ? Full-fat dairy products.  Do not salt foods before eating.  Try to eat at least 2 vegetarian meals each week.  Eat more home-cooked food and less restaurant, buffet, and fast food.  When eating at a restaurant, ask that your food be prepared with less salt or no salt, if possible. What foods are recommended? The items listed may not be a complete list. Talk with your dietitian about what dietary choices are best for you. Grains Whole-grain or whole-wheat bread. Whole-grain or whole-wheat pasta. Brown rice. [place]. Bulgur. Whole-grain and low-sodium cereals. Pita bread. Low-fat, low-sodium crackers. Whole-wheat flour  tortillas. Vegetables Fresh or frozen vegetables (raw, steamed, roasted, or grilled). Low-sodium or reduced-sodium tomato and vegetable juice. Low-sodium or reduced-sodium tomato sauce and tomato paste. Low-sodium or reduced-sodium canned vegetables. Fruits All fresh, dried, or frozen fruit. Canned fruit in natural juice (without added sugar). Meat and other protein foods Skinless chicken or [place]. Ground chicken or [place]. Pork with fat trimmed off. Fish and seafood. Egg whites. Dried beans, peas, or lentils. Unsalted nuts, nut butters, and seeds. Unsalted canned beans. Lean cuts of beef with fat trimmed off. Low-sodium, lean deli meat. Dairy Low-fat (1%) or fat-free (skim) milk. Fat-free, low-fat, or reduced-fat cheeses. Nonfat, low-sodium ricotta or cottage cheese. Low-fat or nonfat yogurt. Low-fat, low-sodium cheese. Fats and oils Soft margarine without trans fats. Vegetable oil. Low-fat, reduced-fat, or light mayonnaise and salad dressings (reduced-sodium). Canola, safflower, olive, soybean, and sunflower oils. Avocado. Seasoning and other foods Herbs. Spices. Seasoning mixes without salt. Unsalted popcorn and pretzels. Fat-free sweets. What foods are not recommended? The items listed may not be a complete list. Talk with your dietitian about what dietary choices  are best for you. Grains Baked goods made with fat, such as croissants, muffins, or some breads. Dry pasta or rice meal packs. Vegetables Creamed or fried vegetables. Vegetables in a cheese sauce. Regular canned vegetables (not low-sodium or reduced-sodium). Regular canned tomato sauce and paste (not low-sodium or reduced-sodium). Regular tomato and vegetable juice (not low-sodium or reduced-sodium). Angie Fava. Olives. Fruits Canned fruit in a light or heavy syrup. Fried fruit. Fruit in cream or butter sauce. Meat and other protein foods Fatty cuts of meat. Ribs. Fried meat. Berniece Salines. Sausage. Bologna and other processed lunch meats.  Salami. Fatback. Hotdogs. Bratwurst. Salted nuts and seeds. Canned beans with added salt. Canned or smoked fish. Whole eggs or egg yolks. Chicken or [place] with skin. Dairy Whole or 2% milk, cream, and half-and-half. Whole or full-fat cream cheese. Whole-fat or sweetened yogurt. Full-fat cheese. Nondairy creamers. Whipped toppings. Processed cheese and cheese spreads. Fats and oils Butter. Stick margarine. Lard. Shortening. Ghee. Bacon fat. Tropical oils, such as coconut, palm kernel, or palm oil. Seasoning and other foods Salted popcorn and pretzels. Onion salt, garlic salt, seasoned salt, table salt, and sea salt. Worcestershire sauce. Tartar sauce. Barbecue sauce. Teriyaki sauce. Soy sauce, including reduced-sodium. Steak sauce. Canned and packaged gravies. Fish sauce. Oyster sauce. Cocktail sauce. Horseradish that you find on the shelf. Ketchup. Mustard. Meat flavorings and tenderizers. Bouillon cubes. Hot sauce and Tabasco sauce. Premade or packaged marinades. Premade or packaged taco seasonings. Relishes. Regular salad dressings. Where to find more information:  National Heart, Lung, and [place]: https://wilson-eaton.com/  American Heart Association: www.heart.org Summary  The DASH eating plan is a healthy eating plan that has been shown to reduce high blood pressure (hypertension). It may also reduce your risk for type 2 diabetes, heart disease, and stroke.  With the DASH eating plan, you should limit salt (sodium) intake to 2,300 mg a day. If you have hypertension, you may need to reduce your sodium intake to 1,500 mg a day.  When on the DASH eating plan, aim to eat more fresh fruits and vegetables, whole grains, lean proteins, low-fat dairy, and heart-healthy fats.  Work with your health care provider or diet and nutrition specialist (dietitian) to adjust your eating plan to your individual calorie needs. This information is not intended to replace advice given to you by your health  care provider. Make sure you discuss any questions you have with your health care provider. Document Revised: 09/24/2017 Document Reviewed: 10/05/2016 Elsevier Patient Education  2020 [employer].

## 2020-03-11 DIAGNOSIS — H353211 Exudative age-related macular degeneration, right eye, with active choroidal neovascularization: Secondary | ICD-10-CM | POA: Diagnosis not present

## 2020-03-11 DIAGNOSIS — H353221 Exudative age-related macular degeneration, left eye, with active choroidal neovascularization: Secondary | ICD-10-CM | POA: Diagnosis not present

## 2020-03-12 ENCOUNTER — Telehealth: Payer: Self-pay | Admitting: Internal Medicine

## 2020-03-12 ENCOUNTER — Ambulatory Visit: Payer: Self-pay

## 2020-03-12 ENCOUNTER — Telehealth: Payer: Self-pay

## 2020-03-12 DIAGNOSIS — I0981 Rheumatic heart failure: Secondary | ICD-10-CM | POA: Diagnosis not present

## 2020-03-12 DIAGNOSIS — I110 Hypertensive heart disease with heart failure: Secondary | ICD-10-CM | POA: Diagnosis not present

## 2020-03-12 DIAGNOSIS — J9601 Acute respiratory failure with hypoxia: Secondary | ICD-10-CM | POA: Diagnosis not present

## 2020-03-12 DIAGNOSIS — I214 Non-ST elevation (NSTEMI) myocardial infarction: Secondary | ICD-10-CM | POA: Diagnosis not present

## 2020-03-12 DIAGNOSIS — J441 Chronic obstructive pulmonary disease with (acute) exacerbation: Secondary | ICD-10-CM | POA: Diagnosis not present

## 2020-03-12 DIAGNOSIS — I2510 Atherosclerotic heart disease of native coronary artery without angina pectoris: Secondary | ICD-10-CM | POA: Diagnosis not present

## 2020-03-12 NOTE — Telephone Encounter (Signed)
Pt informed to keep her CPE scheduled for 5/24.  CM

## 2020-03-12 NOTE — Telephone Encounter (Signed)
There is no difference in her dry cough with discontinuation of ACE inhibitor and switch to an ARB. Recommend a trial of antihistamine in case this cough is actually due to seasonal allergies. If no improvement, plan to stop the losartan and substitute with a different antihypertensive agent.   The Losartan was d/c'd 02/26/20 "DO not resume at discharge." By Dr Earleen Newport at [facility].  Attempted to call Mendel Ryder back and mailbox was full. Called pt and she was informed that the Losartan was discontinued.  Pt has an upcoming CPE next week with PCP.   Reason for Disposition . [1] Caller requesting NON-URGENT health information AND [2] PCP's office is the best resource  Answer Assessment - Initial Assessment Questions 1. REASON FOR CALL or QUESTION: "What is your reason for calling today?" or "How can I best help you?" or "What question do you have that I can help answer?"     Why was Losartan d/c'd  Protocols used: INFORMATION ONLY CALL - NO TRIAGE-A-AH

## 2020-03-12 NOTE — Telephone Encounter (Signed)
Ria Comment w/ [facility] calling on behalf of [facility] to discuss refilling Losartain 50mg  - medication was d/c during hospitalization  Medication Refill - Medication: Losartain 50mg    Has the patient contacted their pharmacy? no  (Agent: If no,  [id number] - [place], [place] [address] AT [place]  Phone:  [phone]

## 2020-03-12 NOTE — Telephone Encounter (Signed)
-----   Message from [name], MD sent at 03/09/2020  4:16 PM EDT ----- Please let Jill Hudson know that she can just keep the appointment with me on 03/18/20.  It is technically too late to do a hospital follow up anyway. ----- Message ----- From: [name] Sent: 03/08/2020   2:03 PM EDT To: [name], MD  Hey Dr. Army Melia, The patient wanted me to tell you hello. The patient saw cardiologist yesterday and they were pleased post discharge with her condition.  She has an appointment with you for annual physical on 03/18/2020. She ask would she need to make another appointment since she was in the hospital and I told her I would need to ask you and if so the office would call and let her know. Let me know if I can help in any way. Thanks, Pam

## 2020-03-14 DIAGNOSIS — I214 Non-ST elevation (NSTEMI) myocardial infarction: Secondary | ICD-10-CM | POA: Diagnosis not present

## 2020-03-14 DIAGNOSIS — J9601 Acute respiratory failure with hypoxia: Secondary | ICD-10-CM | POA: Diagnosis not present

## 2020-03-14 DIAGNOSIS — I2510 Atherosclerotic heart disease of native coronary artery without angina pectoris: Secondary | ICD-10-CM | POA: Diagnosis not present

## 2020-03-14 DIAGNOSIS — J441 Chronic obstructive pulmonary disease with (acute) exacerbation: Secondary | ICD-10-CM | POA: Diagnosis not present

## 2020-03-14 DIAGNOSIS — I110 Hypertensive heart disease with heart failure: Secondary | ICD-10-CM | POA: Diagnosis not present

## 2020-03-14 DIAGNOSIS — I0981 Rheumatic heart failure: Secondary | ICD-10-CM | POA: Diagnosis not present

## 2020-03-15 DIAGNOSIS — I110 Hypertensive heart disease with heart failure: Secondary | ICD-10-CM | POA: Diagnosis not present

## 2020-03-15 DIAGNOSIS — I214 Non-ST elevation (NSTEMI) myocardial infarction: Secondary | ICD-10-CM | POA: Diagnosis not present

## 2020-03-15 DIAGNOSIS — J9601 Acute respiratory failure with hypoxia: Secondary | ICD-10-CM | POA: Diagnosis not present

## 2020-03-15 DIAGNOSIS — I0981 Rheumatic heart failure: Secondary | ICD-10-CM | POA: Diagnosis not present

## 2020-03-15 DIAGNOSIS — J441 Chronic obstructive pulmonary disease with (acute) exacerbation: Secondary | ICD-10-CM | POA: Diagnosis not present

## 2020-03-15 DIAGNOSIS — I2510 Atherosclerotic heart disease of native coronary artery without angina pectoris: Secondary | ICD-10-CM | POA: Diagnosis not present

## 2020-03-18 ENCOUNTER — Ambulatory Visit (INDEPENDENT_AMBULATORY_CARE_PROVIDER_SITE_OTHER): Payer: Medicare Other | Admitting: Family

## 2020-03-18 ENCOUNTER — Other Ambulatory Visit: Payer: Self-pay | Admitting: Internal Medicine

## 2020-03-18 ENCOUNTER — Encounter: Payer: Self-pay | Admitting: Internal Medicine

## 2020-03-18 ENCOUNTER — Ambulatory Visit (INDEPENDENT_AMBULATORY_CARE_PROVIDER_SITE_OTHER): Payer: Medicare Other | Admitting: Internal Medicine

## 2020-03-18 ENCOUNTER — Other Ambulatory Visit: Payer: Self-pay

## 2020-03-18 ENCOUNTER — Encounter: Payer: Self-pay | Admitting: Family

## 2020-03-18 VITALS — BP 148/40 | HR 60 | Ht 60.0 in | Wt 114.2 lb

## 2020-03-18 VITALS — BP 140/82 | HR 56 | Temp 95.2°F | Ht 60.0 in | Wt 111.0 lb

## 2020-03-18 DIAGNOSIS — I10 Essential (primary) hypertension: Secondary | ICD-10-CM | POA: Diagnosis not present

## 2020-03-18 DIAGNOSIS — I5031 Acute diastolic (congestive) heart failure: Secondary | ICD-10-CM

## 2020-03-18 DIAGNOSIS — Z794 Long term (current) use of insulin: Secondary | ICD-10-CM

## 2020-03-18 DIAGNOSIS — Z1231 Encounter for screening mammogram for malignant neoplasm of breast: Secondary | ICD-10-CM

## 2020-03-18 DIAGNOSIS — N1831 Chronic kidney disease, stage 3a: Secondary | ICD-10-CM | POA: Diagnosis not present

## 2020-03-18 DIAGNOSIS — E1122 Type 2 diabetes mellitus with diabetic chronic kidney disease: Secondary | ICD-10-CM | POA: Diagnosis not present

## 2020-03-18 DIAGNOSIS — I739 Peripheral vascular disease, unspecified: Secondary | ICD-10-CM | POA: Diagnosis not present

## 2020-03-18 DIAGNOSIS — I6523 Occlusion and stenosis of bilateral carotid arteries: Secondary | ICD-10-CM

## 2020-03-18 DIAGNOSIS — E1121 Type 2 diabetes mellitus with diabetic nephropathy: Secondary | ICD-10-CM

## 2020-03-18 DIAGNOSIS — I5032 Chronic diastolic (congestive) heart failure: Secondary | ICD-10-CM

## 2020-03-18 DIAGNOSIS — I480 Paroxysmal atrial fibrillation: Secondary | ICD-10-CM | POA: Diagnosis not present

## 2020-03-18 DIAGNOSIS — N183 Chronic kidney disease, stage 3 unspecified: Secondary | ICD-10-CM | POA: Diagnosis not present

## 2020-03-18 DIAGNOSIS — N184 Chronic kidney disease, stage 4 (severe): Secondary | ICD-10-CM

## 2020-03-18 NOTE — Progress Notes (Signed)
Date:  03/18/2020   Name:  Jill Hudson   DOB:  [birth year]   MRN:  [id number]   Chief Complaint: Annual Exam (Breast Exam No pap- aged out. MICRO. ) Jill Hudson is a 85 y.o. female who presents today for her Complete Annual Exam. She feels fairly well.Recently discharged from hospital for respiratory failure and CHF. She reports exercising very little due to health issues. She reports she is sleeping well. She denies breast issues.   Recently hospitalized with pneumonia, Acute CHF. Discharged 02/25/20. HOSPITAL COURSE:   1.  Acute hypoxic respiratory failure.  The patient required oxygen during most of the hospital stay and patient improved throughout the stay and is now breathing comfortably off oxygen. 2.  NSTEMI.  The patient had elevated cardiac enzymes peaking above 3000.  Conservative management was done secondary to patient having acute kidney injury.  Her LDL was 54.  Patient had a stress test on the day of discharge that was a low risk scan.  Cardiology recommended switching to Plavix over to low-dose Eliquis.  Patient is on metoprolol and Crestor. 3.  COPD exacerbation.  This has improved.  The patient finished a steroid course here in the hospital and given nebulizer treatments.  I will give albuterol inhaler and tiotropium bromide olodaterol inhaler. 4.  Acute diastolic congestive heart failure with worsening creatinine.  Cardiology did a right heart cath which showed normal right-sided pressures.  The patient was diuresed with IV Lasix and then switched over to oral torsemide.  Patient on metoprolol.  Follow-up with the CHF clinic. 5.  Hyponatremia secondary to congestive heart failure and chlorthalidone.  Sodium improved from 127 up to 133. 6.  Peripheral vascular disease switched over to low-dose Eliquis and Crestor 7.  Type 2 diabetes mellitus with chronic kidney disease stage IIIa.  Patient had acute kidney injury on top of chronic kidney disease.  Since the patient is off  steroids I will continue her long-acting insulin at 12 units and the short acting insulin prior to meals.  Creatinine worsened and peaked at 2.88 and has trended down to 2.40.  Follow-up as outpatient with nephrology.  Repeat BMP at follow-up appointment. 8.  Jill Hudson has improved 9.  Bleeding in right groin stopped after injection with lidocaine and epinephrine 10.  Paroxysmal atrial fibrillation rate controlled.  Patient did have a GI bleed last month but was placed on Plavix afterwards and did not bleed with heparin drip and the Plavix here.  Cardiology recommended switching over to low-dose Eliquis.  Case discussed with patient's daughter and patient about risk of bleeding.   Mammogram  09/2017 DEXA  06/2018 Colonoscopy aged out Immunization History  Administered Date(s) Administered  . Fluad Quad(high Dose 65+) 09/14/2019  . Influenza, High Dose Seasonal PF 08/17/2017  . Influenza, Seasonal, Injecte, Preservative Fre 09/04/2005, 08/10/2007, 08/27/2010  . Influenza,inj,Quad PF,6+ Mos 08/08/2014, 09/16/2015, 09/23/2016, 07/22/2018  . Influenza,inj,quad, With Preservative 08/16/2013, 09/14/2019  . Pneumococcal Conjugate-13 09/10/2014  . Pneumococcal Polysaccharide-23 10/29/2003  . Pneumococcal-Unspecified 09/04/2010  . Tdap 10/26/2008  . Varicella 05/07/2011  . Zoster 05/07/2011    Diabetes She presents for her follow-up diabetic visit. She has type 2 diabetes mellitus. Pertinent negatives for hypoglycemia include no dizziness, headaches or nervousness/anxiousness. Associated symptoms include weakness. Pertinent negatives for diabetes include no chest pain. Risk factors for coronary artery disease include dyslipidemia and diabetes mellitus. Current diabetic treatment includes insulin injections. An ACE inhibitor/angiotensin II receptor blocker is not being taken.  Hypertension This  is a chronic problem. The problem is controlled. Pertinent negatives include no chest pain, headaches or  shortness of breath. Past treatments include beta blockers. The current treatment provides significant improvement. There are no compliance problems.  Hypertensive end-organ damage includes CAD/MI and heart failure.  CHF - she states weight is stable.  No SOB, just decreased endurance since discharge.  PT is working with her at home.  She uses a cane or walker to ambulate.  No edema noted.  No muscle cramping. PVD - symptoms are stable but she has been very sedentary.  Plavix was stopped and Eliquis started, along with Crestor.  She denies any bleeding issues since her bleeding issues in march.   Lab Results  Component Value Date   CREATININE 2.40 (H) 02/26/2020   BUN 116 (H) 02/26/2020   NA 133 (L) 02/26/2020   K 3.3 (L) 02/26/2020   CL 95 (L) 02/26/2020   CO2 27 02/26/2020   Lab Results  Component Value Date   CHOL 117 02/19/2020   HDL 56 02/19/2020   LDLCALC 54 02/19/2020   TRIG 34 02/19/2020   CHOLHDL 2.1 02/19/2020   Lab Results  Component Value Date   TSH 3.512 12/25/2019   Lab Results  Component Value Date   HGBA1C 6.6 (H) 02/20/2020   Lab Results  Component Value Date   WBC 16.3 (H) 02/26/2020   HGB 10.7 (L) 02/26/2020   HCT 32.0 (L) 02/26/2020   MCV 89.6 02/26/2020   PLT 305 02/26/2020   Lab Results  Component Value Date   ALT 50 (H) 02/21/2020   AST 33 02/21/2020   ALKPHOS 78 02/21/2020   BILITOT 0.7 02/21/2020     Review of Systems  Constitutional: Negative for chills, diaphoresis, fever and unexpected weight change.  HENT: Negative for trouble swallowing.   Respiratory: Negative for cough, chest tightness and shortness of breath.   Cardiovascular: Negative for chest pain and leg swelling.  Gastrointestinal: Negative for abdominal pain, blood in stool, constipation and diarrhea.  Genitourinary: Negative for dysuria.  Musculoskeletal: Positive for arthralgias and gait problem.  Skin: Negative for color change and rash.  Allergic/Immunologic: Negative  for environmental allergies.  Neurological: Positive for weakness and light-headedness. Negative for dizziness and headaches.  Psychiatric/Behavioral: Negative for dysphoric mood and sleep disturbance. The patient is not nervous/anxious.     Patient Active Problem List   Diagnosis Date Noted  . AF (paroxysmal atrial fibrillation) ([place])   . Thrush   . Acute diastolic CHF (congestive heart failure) ([place])   . COPD with acute exacerbation ([place])   . Non-ST elevation (NSTEMI) myocardial infarction ([place])   . CKD (chronic kidney disease), stage IIIa 02/18/2020  . Acute CHF (congestive heart failure) ([place]) 02/18/2020  . Malnutrition of mild degree ([place]) 01/08/2020  . Acute blood loss anemia   . Diverticulosis of large intestine with hemorrhage   . GI bleeding 12/25/2019  . Rectal bleeding   . Age-related macular degeneration, dry, left eye 06/21/2019  . Age-related macular degeneration, wet, right eye ([place]) 06/21/2019  . Moderate nonproliferative diabetic retinopathy associated with type 2 diabetes mellitus ([place]) 06/21/2019  . Lumbosacral radiculopathy at L4 05/01/2019  . Underweight 05/01/2019  . Encounter for long-term (current) use of aspirin 10/31/2018  . Encounter for long-term (current) use of antiplatelets/antithrombotics 10/31/2018  . Long term current use of oral hypoglycemic drug 10/31/2018  . Encounter for long-term (current) use of insulin ([place]) 10/31/2018  . GIB (gastrointestinal bleeding) 08/11/2018  . Leg pain 07/03/2017  .  Carpal tunnel syndrome on both sides 05/05/2017  . Myalgia due to HMG CoA reductase inhibitor 05/05/2017  . History of CVA (cerebrovascular accident) 02/15/2017  . Degenerative disc disease, lumbar 12/21/2016  . Atherosclerosis of native arteries of extremity with intermittent claudication ([place]) 12/20/2016  . Bilateral carotid artery stenosis 12/20/2016  . Hip bursitis 05/18/2016  . Elevated TSH 01/18/2016  . CKD stage 3 due to type 2 diabetes mellitus  ([place]) 01/16/2016  . Senile ecchymosis 01/16/2016  . Type II diabetes mellitus with renal manifestations ([place]) 09/16/2015  . GERD (gastroesophageal reflux disease) 07/24/2015  . PVD (peripheral vascular disease) ([place]) 05/17/2015  . Neoplasm of uncertain behavior of skin 05/17/2015  . Retinopathy, diabetic, proliferative ([place]) 04/11/2015  . Hyperlipidemia 04/11/2015  . Essential hypertension 04/11/2015  . Generalized OA 04/11/2015  . Arteriosclerosis of coronary artery 05/29/2013  . Hypertensive heart disease without CHF 05/29/2013    Allergies  Allergen Reactions  . Saxagliptin Diarrhea  . Epinephrine Other (See Comments)    Patient does not remember what happens when she uses this  . Atorvastatin Other (See Comments)    Muscle aches  . Codeine Other (See Comments)    Upset stomach  . Ezetimibe Other (See Comments)    Myalgias(ZETIA)  . Limonene Rash    Patient does not recall this reaction  . Nitrofurantoin Rash and Other (See Comments)    Pruitus  . Sulfa Antibiotics Rash and Other (See Comments)    Sore mouth     Past Surgical History:  Procedure Laterality Date  . CARDIAC CATHETERIZATION  1998   40% LM, 95% Ramus interm  . CATARACT EXTRACTION, BILATERAL    . COLONOSCOPY WITH PROPOFOL N/A 08/12/2018   Procedure: COLONOSCOPY WITH PROPOFOL;  Surgeon: Lucilla Lame, MD;  [facility] END[facility]COPY;  Service: Endoscopy;  Laterality: N/A;  . ENDARTERECTOMY FEMORAL Left 10/12/2018   Procedure: ENDARTERECTOMY FEMORAL;  Surgeon: Katha Cabal, MD;  Location: [facility] ORS;  Service: Vascular;  Laterality: Left;  angioplasty and left SFA stent placement  . EYE SURGERY Bilateral    cataract extractions  . LOWER EXTREMITY ANGIOGRAPHY Left 08/23/2017   Procedure: Lower Extremity Angiography;  Surgeon: Algernon Huxley, MD;  Location: [place] CV LAB;  Service: Cardiovascular;  Laterality: Left;  . LOWER EXTREMITY ANGIOGRAPHY Left 07/26/2018   Procedure: LOWER EXTREMITY ANGIOGRAPHY;   Surgeon: Katha Cabal, MD;  Location: [place] CV LAB;  Service: Cardiovascular;  Laterality: Left;  . LOWER EXTREMITY ANGIOGRAPHY Left 09/16/2018   Procedure: LOWER EXTREMITY ANGIOGRAPHY;  Surgeon: Katha Cabal, MD;  Location: [place] CV LAB;  Service: Cardiovascular;  Laterality: Left;  . PTCA  08/2013   Left common iliac  . PTCA  12/2012   left ext iliac  . RIGHT HEART CATH N/A 02/23/2020   Procedure: RIGHT HEART CATH;  Surgeon: Minna Merritts, MD;  Location: [place] [facility];  Service: Cardiovascular;  Laterality: N/A;  . TUBAL LIGATION      Social History   Tobacco Use  . Smoking status: Former Smoker    Packs/day: 2.00    Years: 37.00    Pack years: 74.00    Types: Cigarettes    Quit date: 1980    Years since quitting: 41.4  . Smokeless tobacco: Never Used  . Tobacco comment: smoking cessation materials not required  Substance Use Topics  . Alcohol use: No    Alcohol/week: 0.0 standard drinks  . Drug use: No     Medication list has been  reviewed and updated.  Current Meds  Medication Sig  . acetaminophen (TYLENOL) 650 MG CR tablet Take 1,300 mg by mouth every 8 (eight) hours as needed for pain.  Marland Kitchen albuterol (VENTOLIN HFA) 108 (90 Base) MCG/ACT inhaler Inhale 2 puffs into the lungs every 6 (six) hours as needed for wheezing or shortness of breath.  Marland Kitchen apixaban (ELIQUIS) 2.5 MG TABS tablet Take 1 tablet (2.5 mg total) by mouth 2 (two) times daily.  . B-D ULTRAFINE III SHORT PEN 31G X 8 MM MISC USE AS DIRECTED  . Cyanocobalamin (RA VITAMIN B-12 TR) 1000 MCG TBCR Take 1,000 mcg by mouth daily.   . feeding supplement, GLUCERNA SHAKE, (GLUCERNA SHAKE) LIQD Take 237 mLs by mouth 3 (three) times daily between meals.  . ferrous sulfate 325 (65 FE) MG tablet Take 325 mg by mouth daily with breakfast.  . gabapentin (NEURONTIN) 100 MG capsule Take 1 capsule (100 mg total) by mouth at bedtime. (Patient taking differently: Take 100 mg by mouth 3 (three)  times daily. )  . insulin aspart (NOVOLOG FLEXPEN) 100 UNIT/ML FlexPen Inject 3 Units into the skin 3 (three) times daily with meals. At lunch (Patient taking differently: Inject 20 Units into the skin daily. At breakfast- sliding scale)  . Insulin Glargine (BASAGLAR KWIKPEN) 100 UNIT/ML SOPN Inject 0.12 mLs (12 Units total) into the skin daily.  . melatonin 5 MG TABS Take 0.5 tablets (2.5 mg total) by mouth at bedtime as needed (sleep).  . metoprolol tartrate (LOPRESSOR) 25 MG tablet Take 1 tablet (25 mg total) by mouth 2 (two) times daily.  . Multiple Vitamins-Minerals (PRESERVISION AREDS PO) Take 1 capsule by mouth 2 (two) times daily.   Marland Kitchen oxyCODONE-acetaminophen (PERCOCET/ROXICET) 5-325 MG tablet Take 1 tablet by mouth every 6 (six) hours as needed for severe pain.  . rosuvastatin (CRESTOR) 5 MG tablet Take 5 mg by mouth every other day. Mon, Wed, Fri and Sat.  . Tiotropium Bromide-Olodaterol 2.5-2.5 MCG/ACT AERS Inhale 2 puffs into the lungs daily.  Marland Kitchen torsemide (DEMADEX) 20 MG tablet Take 2 tablets (40 mg total) by mouth daily.    PHQ 2/9 Scores 03/18/2020 01/08/2020 01/01/2020 10/02/2019  PHQ - 2 Score 2 0 2 2  PHQ- 9 Score 10 0 3 3    BP Readings from Last 3 Encounters:  03/18/20 140/82  03/08/20 (!) 125/45  02/28/20 125/82    Physical Exam Vitals and nursing note reviewed.  Constitutional:      General: She is not in acute distress.    Appearance: She is well-developed.  HENT:     Head: Normocephalic and atraumatic.  Eyes:     General: No scleral icterus.       Right eye: No discharge.        Left eye: No discharge.     Conjunctiva/sclera: Conjunctivae normal.  Neck:     Thyroid: No thyromegaly.     Vascular: No carotid bruit.  Cardiovascular:     Rate and Rhythm: Normal rate and regular rhythm.     Pulses: Normal pulses.     Heart sounds: Normal heart sounds.  Pulmonary:     Effort: Pulmonary effort is normal. No respiratory distress.     Breath sounds: No wheezing.    Chest:     Breasts:        Right: No mass, nipple discharge, skin change or tenderness.        Left: No mass, nipple discharge, skin change or tenderness.  Abdominal:  General: Bowel sounds are normal.     Palpations: Abdomen is soft.     Tenderness: There is no abdominal tenderness.  Musculoskeletal:     Cervical back: Normal range of motion. No erythema.     Right lower leg: No edema.     Left lower leg: No edema.  Lymphadenopathy:     Cervical: No cervical adenopathy.  Skin:    General: Skin is warm and dry.     Capillary Refill: Capillary refill takes less than 2 seconds.     Findings: No rash.  Neurological:     General: No focal deficit present.     Mental Status: She is alert and oriented to person, place, and time.     Cranial Nerves: No cranial nerve deficit.     Sensory: No sensory deficit.     Motor: Weakness present.     Gait: Gait abnormal (uses cane ).     Deep Tendon Reflexes: Reflexes are normal and symmetric.  Psychiatric:        Attention and Perception: Attention normal.        Mood and Affect: Mood normal.        Speech: Speech normal.        Cognition and Memory: Cognition normal.     Wt Readings from Last 3 Encounters:  03/18/20 111 lb (50.3 kg)  03/08/20 113 lb (51.3 kg)  02/28/20 114 lb (51.7 kg)    BP 140/82   Pulse (!) 56   Temp (!) 95.2 F (35.1 C) (Oral) Comment: tried twice  Ht 5' (1.524 m)   Wt 111 lb (50.3 kg)   SpO2 97%   BMI 21.68 kg/m   Assessment and Plan: 1. Essential hypertension Clinically stable exam with well controlled BP on metoprolol and torsemide. Amlodipine, cartia and losartan stopped at hospital discharge but BP remains stable Tolerating medications without side effects at this time. Pt to continue current regimen and low sodium diet; benefits of regular exercise as able discussed.  2. CKD stage 3 due to type 2 diabetes mellitus ([place]) She has a bump in BUN and Cr at admission This trended down by discharge  but was not back to baseline Will recheck today and advise - Basic metabolic panel  3. Type 2 diabetes mellitus with stage 3a chronic kidney disease, without long-term current use of insulin (HCC) A1C stable last month.  On insulin injections. Metformin stopped due to renal issues. - Microalbumin / creatinine urine ratio  4. Encounter for screening mammogram for breast cancer - MM 3D SCREEN BREAST BILATERAL; Future  5. PVD (peripheral vascular disease) (HCC) Plavix was stopped Pt was started on Eliquis and Crestor  6. Acute diastolic CHF (congestive heart failure) ([place]) Appears eu-volemic on exam Weights at home have been stable ~113 every morning Continue torsemide, daily weights, cardiology follow up    Partially dictated using [place]. Any errors are unintentional.  [name], MD [facility]  03/18/2020

## 2020-03-18 NOTE — Patient Instructions (Addendum)
Medication Instructions:  No medication changes today.   *If you need a refill on your cardiac medications before your next appointment, please call your pharmacy*   Lab Work: No lab work ordered today.   Testing/Procedures: Your EKG today shows normal sinus rhythm.    Follow-Up: At [facility], you and your health needs are our priority.  As part of our continuing mission to provide you with exceptional heart care, we have created designated Provider Care Teams.  These Care Teams include your primary Cardiologist (physician) and Advanced Practice Providers (APPs -  Physician Assistants and Nurse Practitioners) who all work together to provide you with the care you need, when you need it.  We recommend signing up for the patient portal called "MyChart".  Sign up information is provided on this After Visit Summary.  MyChart is used to connect with patients for Virtual Visits (Telemedicine).  Patients are able to view lab/test results, encounter notes, upcoming appointments, etc.  Non-urgent messages can be sent to your provider as well.   To learn more about what you can do with MyChart, go to [email or web address].    Your next appointment:   As needed.  Continue to follow with Dr. Raliegh Ip of [facility] cardiology.

## 2020-03-18 NOTE — Progress Notes (Signed)
Office Visit    Patient Name: Jill Hudson Date of Encounter: 03/18/2020  Primary Care Provider:  [name], MD Primary Cardiologist:  Dr. Madlyn Hudson [facility] Electrophysiologist:  None   Chief Complaint    Jill Hudson is a 85 y.o. female with a hx of CAD, PAF on Eliquis, HFpEF, HTN, HLD presents today for follow up after cardiac catheterization   Past Medical History    Past Medical History:  Diagnosis Date  . Acute CHF (congestive heart failure) ([place]) 02/18/2020  . Acute diastolic CHF (congestive heart failure) ([place])   . Acute kidney injury superimposed on CKD ([place])   . Allergies   . Anxiety   . Arthritis    spine and shoulder  . Atherosclerosis of artery of extremity with rest pain ([place]) 10/12/2018  . Cancer ([place])    skin  . CHF (congestive heart failure) ([place])   . Diabetes mellitus without complication ([place])   . Heart murmur   . Hyperlipidemia   . Hypertension   . Macula lutea degeneration   . Mitral and aortic valve disease   . Myocardial infarction [facility])    may have had a "light" heart attack  . Non-ST elevation (NSTEMI) myocardial infarction ([place])   . Occasional tremors   . PAD (peripheral artery disease) ([place])   . Shingles    patient unaware but daughter confirms. it was a long time ago  . Stroke [facility]) 01/2017   may have had a slight stroke  . TIA (transient ischemic attack) 01/2017  . UTI (urinary tract infection)   . Vascular disease, peripheral [facility])    Past Surgical History:  Procedure Laterality Date  . CARDIAC CATHETERIZATION  1998   40% LM, 95% Ramus interm  . CATARACT EXTRACTION, BILATERAL    . COLONOSCOPY WITH PROPOFOL N/A 08/12/2018   Procedure: COLONOSCOPY WITH PROPOFOL;  Surgeon: Jill Lame, MD;  Location: [facility] ENDOSCOPY;  Service: Endoscopy;  Laterality: N/A;  . ENDARTERECTOMY FEMORAL Left 10/12/2018   Procedure: ENDARTERECTOMY FEMORAL;  Surgeon: Jill Cabal, MD;  Location: [facility] ORS;  Service: Vascular;  Laterality: Left;   angioplasty and left SFA stent placement  . EYE SURGERY Bilateral    cataract extractions  . LOWER EXTREMITY ANGIOGRAPHY Left 08/23/2017   Procedure: Lower Extremity Angiography;  Surgeon: Jill Huxley, MD;  Location: [place] CV LAB;  Service: Cardiovascular;  Laterality: Left;  . LOWER EXTREMITY ANGIOGRAPHY Left 07/26/2018   Procedure: LOWER EXTREMITY ANGIOGRAPHY;  Surgeon: Jill Cabal, MD;  Location: [place] CV LAB;  Service: Cardiovascular;  Laterality: Left;  . LOWER EXTREMITY ANGIOGRAPHY Left 09/16/2018   Procedure: LOWER EXTREMITY ANGIOGRAPHY;  Surgeon: Jill Cabal, MD;  Location: [place] CV LAB;  Service: Cardiovascular;  Laterality: Left;  . PTCA  08/2013   Left common iliac  . PTCA  12/2012   left ext iliac  . RIGHT HEART CATH N/A 02/23/2020   Procedure: RIGHT HEART CATH;  Surgeon: Jill Merritts, MD;  Location: [place] CV LAB;  Service: Cardiovascular;  Laterality: N/A;  . TUBAL LIGATION      Allergies  Allergies  Allergen Reactions  . Saxagliptin Diarrhea  . Epinephrine Other (See Comments)    Patient does not remember what happens when she uses this  . Atorvastatin Other (See Comments)    Muscle aches  . Codeine Other (See Comments)    Upset stomach  . Ezetimibe Other (See Comments)    Myalgias(ZETIA)  . Limonene Rash  Patient does not recall this reaction  . Nitrofurantoin Rash and Other (See Comments)    Pruitus  . Sulfa Antibiotics Rash and Other (See Comments)    Sore mouth     History of Present Illness    [name] is a 85 y.o. female with a hx of CAD, PAF on Eliquis, HFpEF, HTN, HLD. She was last seen by Jill Price, NP on 02/28/20 and by her primary cardiologist Dr. Madlyn Hudson on 03/07/20.   She was admitted 02/18/20 - 02/26/20 for acute hypoxic respiratory failure and NSTEMI. Cardiac enzymes peaked above 3000. She was treated with steroids for COPD exacerbation and IV Lasix for acute diastolic heart failure. Stress  test 02/26/20 low risk scan. She was started on Eliquis due to new onset atrial fibrillation. Reduced dosing utilized due to age and body habitus.   Reports feeling well. Present with her daughter today. Reports no shortness of breath at rest and tells me her dyspnea on exertion is improving. Reports no chest pain, pressure, or tightness. No orthopnea, PND. Reports no palpitations. Has mild edema to LLE but this is improved with compression stockings. She has a [place] nurse coming to check on her at home.   Reports home weights are stable. Monitoring blood pressure carefully with log showing systolic readings [id number]. She was seen by her primary cardiologist 03/07/20 who recommended continuing to monitor until her follow up in 2 weeks.   EKGs/Labs/Other Studies Reviewed:   The following studies were reviewed today:    [place] 02/23/2020: Right heart pressures RA mean of 3 RV 37/2 and 9 PA 37/11 mean 24 Wedge pressure mean 13 Cardiac output 3.11 Cardiac index 2.04  Final Conclusions:   High normal right heart pressures Mean PA pressure 24, wedge 13, RA 3   Recommendations:  Given dramatic improvement in her symptoms, less abdominal bloating, improved shortness of breath, Consider transition to oral diuretics tomorrow Will discuss with nephrology and hospitalist service __________   2D echo 02/19/2020:  1. Left ventricular ejection fraction, by estimation, is 55 to 60%. The  left ventricle has normal function. The left ventricle has no regional  wall motion abnormalities. There is mild left ventricular hypertrophy.  Left ventricular diastolic parameters  are consistent with Grade II diastolic dysfunction (pseudonormalization).   2. Right ventricular systolic function is normal. The right ventricular  size is normal. There is moderately elevated pulmonary artery systolic  pressure.   3. Left atrial size was mildly dilated.   4. The mitral valve is myxomatous. Moderate mitral valve  regurgitation.  No evidence of mitral stenosis.   5. The aortic valve is normal in structure. Aortic valve regurgitation is  not visualized. Mild aortic valve sclerosis is present, with no evidence  of aortic valve stenosis.   6. The inferior vena cava is dilated in size with >50% respiratory  variability, suggesting right atrial pressure of 8 mmHg.  US RENAL   Result Date: 02/21/2020 IMPRESSION: 1. Renal echogenicity mildly increased bilaterally, a finding that may be indicative of a degree of medical renal disease. Renal cortical thickness within normal limits bilaterally. No obstructing focus in either kidney. 2.  There are cysts in the left kidney. 3.  Bilateral pleural effusions. Electronically Signed   By: Lowella Grip III M.D.   On: 02/21/2020 11:02  EKG:  EKG is ordered today.  The ekg ordered today demonstrates NSR with improvement in t-wave changes in inferior and lateral leads compared to EKG while hospitalized.  Recent Labs: 12/25/2019:  TSH 3.512 02/18/2020: B Natriuretic Peptide 600.0 02/19/2020: Magnesium 2.1 02/21/2020: ALT 50 02/26/2020: BUN 116; Creatinine, Ser 2.40; Hemoglobin 10.7; Platelets 305; Potassium 3.3; Sodium 133  Recent Lipid Panel    Component Value Date/Time   CHOL 117 02/19/2020 0506   CHOL 139 12/26/2018 1612   TRIG 34 02/19/2020 0506   HDL 56 02/19/2020 0506   HDL 47 12/26/2018 1612   CHOLHDL 2.1 02/19/2020 0506   VLDL 7 02/19/2020 0506   LDLCALC 54 02/19/2020 0506   LDLCALC 57 12/26/2018 1612    Home Medications   Current Meds  Medication Sig  . acetaminophen (TYLENOL) 650 MG CR tablet Take 1,300 mg by mouth every 8 (eight) hours as needed for pain.  Marland Kitchen albuterol (VENTOLIN HFA) 108 (90 Base) MCG/ACT inhaler Inhale 2 puffs into the lungs every 6 (six) hours as needed for wheezing or shortness of breath.  Marland Kitchen apixaban (ELIQUIS) 2.5 MG TABS tablet Take 1 tablet (2.5 mg total) by mouth 2 (two) times daily.  . B-D ULTRAFINE III SHORT PEN 31G X 8 MM  MISC USE AS DIRECTED  . Cyanocobalamin (RA VITAMIN B-12 TR) 1000 MCG TBCR Take 1,000 mcg by mouth daily.   . feeding supplement, GLUCERNA SHAKE, (GLUCERNA SHAKE) LIQD Take 237 mLs by mouth 3 (three) times daily between meals.  . ferrous sulfate 325 (65 FE) MG tablet Take 325 mg by mouth daily with breakfast.  . gabapentin (NEURONTIN) 100 MG capsule Take 1 capsule (100 mg total) by mouth at bedtime. (Patient taking differently: Take 100 mg by mouth 3 (three) times daily. )  . insulin aspart (NOVOLOG FLEXPEN) 100 UNIT/ML FlexPen Inject 3 Units into the skin 3 (three) times daily with meals. At lunch (Patient taking differently: Inject 20 Units into the skin daily. At breakfast- sliding scale)  . Insulin Glargine (BASAGLAR KWIKPEN) 100 UNIT/ML SOPN Inject 0.12 mLs (12 Units total) into the skin daily.  . melatonin 5 MG TABS Take 0.5 tablets (2.5 mg total) by mouth at bedtime as needed (sleep).  . metoprolol tartrate (LOPRESSOR) 25 MG tablet Take 1 tablet (25 mg total) by mouth 2 (two) times daily.  . Multiple Vitamins-Minerals (PRESERVISION AREDS PO) Take 1 capsule by mouth 2 (two) times daily.   Marland Kitchen oxyCODONE-acetaminophen (PERCOCET/ROXICET) 5-325 MG tablet Take 1 tablet by mouth every 6 (six) hours as needed for severe pain.  . rosuvastatin (CRESTOR) 5 MG tablet Take 5 mg by mouth every other day. Mon, Wed, Fri and Sat.  . Tiotropium Bromide-Olodaterol 2.5-2.5 MCG/ACT AERS Inhale 2 puffs into the lungs daily.  Marland Kitchen torsemide (DEMADEX) 20 MG tablet Take 2 tablets (40 mg total) by mouth daily.      Review of Systems   Review of Systems  Constitution: Negative for chills, fever and malaise/fatigue.  Cardiovascular: Positive for dyspnea on exertion. Negative for chest pain, irregular heartbeat, leg swelling, near-syncope, orthopnea, palpitations and syncope.  Respiratory: Negative for cough, shortness of breath and wheezing.   Gastrointestinal: Negative for melena, nausea and vomiting.  Genitourinary:  Negative for hematuria.  Neurological: Negative for dizziness, light-headedness and weakness.   All other systems reviewed and are otherwise negative except as noted above.  Physical Exam    VS:  BP (!) 148/40 (BP Location: Left Arm, Patient Position: Sitting, Cuff Size: Normal)   Pulse 60   Ht 5' (1.524 m)   Wt 114 lb 4 oz (51.8 kg)   SpO2 98%   BMI 22.31 kg/m  , BMI Body mass index is 22.[place]  kg/m. GEN: Well nourished, well developed, in no acute distress. HEENT: normal. Neck: Supple, no JVD, carotid bruits, or masses. Cardiac: RRR, no murmurs, rubs, or gallops. No clubbing, cyanosis, edema.  Radials/DP/PT 2+ and equal bilaterally.  Respiratory:  Respirations regular and unlabored, clear to auscultation bilaterally. GI: Soft, nontender, nondistended, BS + x 4. MS: No deformity or atrophy. Skin: Warm and dry, no rash. Neuro:  Strength and sensation are intact. Psych: Normal affect.  Assessment & Plan    1. PAF -new diagnosis during recent hospitalization.  EKG today shows she is maintaining sinus rhythm.  Continue metoprolol and Eliquis 2.5 mg twice daily.  2. Chronic anticoagulation - CHADS2VASc of at least 9 (agex2, stroke x2, CHF, HTN, DM, vascular disease, gender). Denies bleeding complications on Eliquis 2.5mg  BID. Reduced dosing due to age, renal function, body habitus. Samples provided today. Further refills from her cardiologist at [facility].  3. Chronic diastolic heart failure - Euvolemic and well compensated on exam today. Continue Metoprolol and Torsemide.  Right groin cath site is clean, dry, intact with no erythema, ecchymosis, hematoma.  4. CAD -prior LHC 1998 with 40% left main and 95% ramus stenoses.  No LHC during recent admission due to worsening renal function.  Underwent Lexiscan MPI which was a low risk study.  No indication for further ischemic evaluation this time.  GDMT includes beta-blocker and statin.  No aspirin/Plavix secondary to anticoagulation.  Continue to  follow with primary cardiologist at [facility]  5. HTN - BP mildly elevated. Antihypertensive agents discontinued in hospital due to renal dysfunction. Will defer to primary cardiologist at [facility], she has upcoming appt in 2 weeks.   6. CKD3 - Careful utilization of diuretics and antihypertensives.   7. DM2 - Continue to follow with primary care.   Disposition: Follow-up as needed with [place] [facility].  She will continue to follow with her primary cardiologist at [facility].  Loel Dubonnet, NP 03/18/2020, 4:09 PM

## 2020-03-19 ENCOUNTER — Telehealth: Payer: Self-pay | Admitting: Internal Medicine

## 2020-03-19 DIAGNOSIS — I2510 Atherosclerotic heart disease of native coronary artery without angina pectoris: Secondary | ICD-10-CM | POA: Diagnosis not present

## 2020-03-19 DIAGNOSIS — J441 Chronic obstructive pulmonary disease with (acute) exacerbation: Secondary | ICD-10-CM | POA: Diagnosis not present

## 2020-03-19 DIAGNOSIS — I214 Non-ST elevation (NSTEMI) myocardial infarction: Secondary | ICD-10-CM | POA: Diagnosis not present

## 2020-03-19 DIAGNOSIS — I110 Hypertensive heart disease with heart failure: Secondary | ICD-10-CM | POA: Diagnosis not present

## 2020-03-19 DIAGNOSIS — I0981 Rheumatic heart failure: Secondary | ICD-10-CM | POA: Diagnosis not present

## 2020-03-19 DIAGNOSIS — J9601 Acute respiratory failure with hypoxia: Secondary | ICD-10-CM | POA: Diagnosis not present

## 2020-03-19 LAB — BASIC METABOLIC PANEL
BUN/Creatinine Ratio: 23 (ref 12–28)
BUN: 40 mg/dL — ABNORMAL HIGH (ref 8–27)
CO2: 23 mmol/L (ref 20–29)
Calcium: 9.5 mg/dL (ref 8.7–10.3)
Chloride: 95 mmol/L — ABNORMAL LOW (ref 96–106)
Creatinine, Ser: 1.72 mg/dL — ABNORMAL HIGH (ref 0.57–1.00)
GFR calc Af Amer: 31 mL/min/{1.73_m2} — ABNORMAL LOW (ref >59)
GFR calc non Af Amer: 27 mL/min/{1.73_m2} — ABNORMAL LOW (ref >59)
Glucose: 120 mg/dL — ABNORMAL HIGH (ref 65–99)
Potassium: 5.1 mmol/L (ref 3.5–5.2)
Sodium: 134 mmol/L (ref 134–144)

## 2020-03-19 LAB — MICROALBUMIN / CREATININE URINE RATIO
Creatinine, Urine: 36.7 mg/dL
Microalb/Creat Ratio: 136 mg/g creat — ABNORMAL HIGH (ref 0–29)
Microalbumin, Urine: 49.8 ug/mL

## 2020-03-19 NOTE — Telephone Encounter (Unsigned)
Copied from [place] [id number]. Topic: General - Other >> Mar 19, 2020  1:28 PM Oneta Rack wrote: Caller name: Janelle  Relation to pt: PTA from [facility]  Call back number: [phone]    Reason for call:  Patient forgot to tell PCP at office visit yesterday, back and leg pain has not improved and gabapentin and tylenol Is not working. PTA requesting baclofen or a stronger alternate, please advise

## 2020-03-19 NOTE — Telephone Encounter (Signed)
Called and spoke with pts daughter Judson Roch Gains. Dr. Army Melia recommends that the pt try to take 300 mg of gabapentin at bedtime and 100 mg in the AM to see if this will help with leg pain. Will call back after 5 days to let us know if this is helping pt or not.   CM

## 2020-03-19 NOTE — Telephone Encounter (Signed)
We did not discuss her leg and back pain or gabapentin yesterday.  She has gabapentin on her list from the ED.  Not sure the source but she has known PVD.  Find out if she has seen vascular surgery recently or has an upcoming appt.

## 2020-03-20 ENCOUNTER — Other Ambulatory Visit: Payer: Self-pay | Admitting: Internal Medicine

## 2020-03-21 DIAGNOSIS — I0981 Rheumatic heart failure: Secondary | ICD-10-CM | POA: Diagnosis not present

## 2020-03-21 DIAGNOSIS — I214 Non-ST elevation (NSTEMI) myocardial infarction: Secondary | ICD-10-CM | POA: Diagnosis not present

## 2020-03-21 DIAGNOSIS — J9601 Acute respiratory failure with hypoxia: Secondary | ICD-10-CM | POA: Diagnosis not present

## 2020-03-21 DIAGNOSIS — I110 Hypertensive heart disease with heart failure: Secondary | ICD-10-CM | POA: Diagnosis not present

## 2020-03-21 DIAGNOSIS — I2510 Atherosclerotic heart disease of native coronary artery without angina pectoris: Secondary | ICD-10-CM | POA: Diagnosis not present

## 2020-03-21 DIAGNOSIS — J441 Chronic obstructive pulmonary disease with (acute) exacerbation: Secondary | ICD-10-CM | POA: Diagnosis not present

## 2020-03-22 DIAGNOSIS — J441 Chronic obstructive pulmonary disease with (acute) exacerbation: Secondary | ICD-10-CM | POA: Diagnosis not present

## 2020-03-22 DIAGNOSIS — I2510 Atherosclerotic heart disease of native coronary artery without angina pectoris: Secondary | ICD-10-CM | POA: Diagnosis not present

## 2020-03-22 DIAGNOSIS — I214 Non-ST elevation (NSTEMI) myocardial infarction: Secondary | ICD-10-CM | POA: Diagnosis not present

## 2020-03-22 DIAGNOSIS — I110 Hypertensive heart disease with heart failure: Secondary | ICD-10-CM | POA: Diagnosis not present

## 2020-03-22 DIAGNOSIS — J9601 Acute respiratory failure with hypoxia: Secondary | ICD-10-CM | POA: Diagnosis not present

## 2020-03-22 DIAGNOSIS — I0981 Rheumatic heart failure: Secondary | ICD-10-CM | POA: Diagnosis not present

## 2020-03-24 ENCOUNTER — Other Ambulatory Visit (INDEPENDENT_AMBULATORY_CARE_PROVIDER_SITE_OTHER): Payer: Self-pay | Admitting: Vascular Surgery

## 2020-03-26 ENCOUNTER — Other Ambulatory Visit: Payer: Self-pay | Admitting: Internal Medicine

## 2020-03-26 DIAGNOSIS — I214 Non-ST elevation (NSTEMI) myocardial infarction: Secondary | ICD-10-CM | POA: Diagnosis not present

## 2020-03-26 DIAGNOSIS — I110 Hypertensive heart disease with heart failure: Secondary | ICD-10-CM | POA: Diagnosis not present

## 2020-03-26 DIAGNOSIS — I0981 Rheumatic heart failure: Secondary | ICD-10-CM | POA: Diagnosis not present

## 2020-03-26 DIAGNOSIS — J9601 Acute respiratory failure with hypoxia: Secondary | ICD-10-CM | POA: Diagnosis not present

## 2020-03-26 DIAGNOSIS — I2510 Atherosclerotic heart disease of native coronary artery without angina pectoris: Secondary | ICD-10-CM | POA: Diagnosis not present

## 2020-03-26 DIAGNOSIS — J441 Chronic obstructive pulmonary disease with (acute) exacerbation: Secondary | ICD-10-CM | POA: Diagnosis not present

## 2020-03-27 DIAGNOSIS — N179 Acute kidney failure, unspecified: Secondary | ICD-10-CM | POA: Diagnosis not present

## 2020-03-27 DIAGNOSIS — K5731 Diverticulosis of large intestine without perforation or abscess with bleeding: Secondary | ICD-10-CM | POA: Insufficient documentation

## 2020-03-27 DIAGNOSIS — N1831 Chronic kidney disease, stage 3a: Secondary | ICD-10-CM | POA: Diagnosis not present

## 2020-03-27 DIAGNOSIS — R600 Localized edema: Secondary | ICD-10-CM | POA: Diagnosis not present

## 2020-03-27 DIAGNOSIS — I10 Essential (primary) hypertension: Secondary | ICD-10-CM | POA: Diagnosis not present

## 2020-04-01 DIAGNOSIS — E1151 Type 2 diabetes mellitus with diabetic peripheral angiopathy without gangrene: Secondary | ICD-10-CM | POA: Diagnosis not present

## 2020-04-01 DIAGNOSIS — J441 Chronic obstructive pulmonary disease with (acute) exacerbation: Secondary | ICD-10-CM | POA: Diagnosis not present

## 2020-04-01 DIAGNOSIS — E785 Hyperlipidemia, unspecified: Secondary | ICD-10-CM | POA: Diagnosis not present

## 2020-04-01 DIAGNOSIS — N179 Acute kidney failure, unspecified: Secondary | ICD-10-CM | POA: Diagnosis not present

## 2020-04-01 DIAGNOSIS — I2510 Atherosclerotic heart disease of native coronary artery without angina pectoris: Secondary | ICD-10-CM | POA: Diagnosis not present

## 2020-04-01 DIAGNOSIS — M19019 Primary osteoarthritis, unspecified shoulder: Secondary | ICD-10-CM | POA: Diagnosis not present

## 2020-04-01 DIAGNOSIS — N1831 Chronic kidney disease, stage 3a: Secondary | ICD-10-CM | POA: Diagnosis not present

## 2020-04-01 DIAGNOSIS — D509 Iron deficiency anemia, unspecified: Secondary | ICD-10-CM | POA: Diagnosis not present

## 2020-04-01 DIAGNOSIS — J9601 Acute respiratory failure with hypoxia: Secondary | ICD-10-CM | POA: Diagnosis not present

## 2020-04-01 DIAGNOSIS — E1122 Type 2 diabetes mellitus with diabetic chronic kidney disease: Secondary | ICD-10-CM | POA: Diagnosis not present

## 2020-04-01 DIAGNOSIS — K219 Gastro-esophageal reflux disease without esophagitis: Secondary | ICD-10-CM | POA: Diagnosis not present

## 2020-04-01 DIAGNOSIS — I0981 Rheumatic heart failure: Secondary | ICD-10-CM | POA: Diagnosis not present

## 2020-04-01 DIAGNOSIS — H3530 Unspecified macular degeneration: Secondary | ICD-10-CM | POA: Diagnosis not present

## 2020-04-01 DIAGNOSIS — F419 Anxiety disorder, unspecified: Secondary | ICD-10-CM | POA: Diagnosis not present

## 2020-04-01 DIAGNOSIS — M479 Spondylosis, unspecified: Secondary | ICD-10-CM | POA: Diagnosis not present

## 2020-04-01 DIAGNOSIS — I70229 Atherosclerosis of native arteries of extremities with rest pain, unspecified extremity: Secondary | ICD-10-CM | POA: Diagnosis not present

## 2020-04-01 DIAGNOSIS — I110 Hypertensive heart disease with heart failure: Secondary | ICD-10-CM | POA: Diagnosis not present

## 2020-04-01 DIAGNOSIS — R011 Cardiac murmur, unspecified: Secondary | ICD-10-CM | POA: Diagnosis not present

## 2020-04-01 DIAGNOSIS — E871 Hypo-osmolality and hyponatremia: Secondary | ICD-10-CM | POA: Diagnosis not present

## 2020-04-01 DIAGNOSIS — B370 Candidal stomatitis: Secondary | ICD-10-CM | POA: Diagnosis not present

## 2020-04-01 DIAGNOSIS — I080 Rheumatic disorders of both mitral and aortic valves: Secondary | ICD-10-CM | POA: Diagnosis not present

## 2020-04-01 DIAGNOSIS — I5033 Acute on chronic diastolic (congestive) heart failure: Secondary | ICD-10-CM | POA: Diagnosis not present

## 2020-04-01 DIAGNOSIS — I480 Paroxysmal atrial fibrillation: Secondary | ICD-10-CM | POA: Diagnosis not present

## 2020-04-01 DIAGNOSIS — R251 Tremor, unspecified: Secondary | ICD-10-CM | POA: Diagnosis not present

## 2020-04-01 DIAGNOSIS — I214 Non-ST elevation (NSTEMI) myocardial infarction: Secondary | ICD-10-CM | POA: Diagnosis not present

## 2020-04-02 DIAGNOSIS — I0981 Rheumatic heart failure: Secondary | ICD-10-CM | POA: Diagnosis not present

## 2020-04-02 DIAGNOSIS — I2510 Atherosclerotic heart disease of native coronary artery without angina pectoris: Secondary | ICD-10-CM | POA: Diagnosis not present

## 2020-04-02 DIAGNOSIS — I110 Hypertensive heart disease with heart failure: Secondary | ICD-10-CM | POA: Diagnosis not present

## 2020-04-02 DIAGNOSIS — J9601 Acute respiratory failure with hypoxia: Secondary | ICD-10-CM | POA: Diagnosis not present

## 2020-04-02 DIAGNOSIS — I214 Non-ST elevation (NSTEMI) myocardial infarction: Secondary | ICD-10-CM | POA: Diagnosis not present

## 2020-04-02 DIAGNOSIS — J441 Chronic obstructive pulmonary disease with (acute) exacerbation: Secondary | ICD-10-CM | POA: Diagnosis not present

## 2020-04-08 DIAGNOSIS — J441 Chronic obstructive pulmonary disease with (acute) exacerbation: Secondary | ICD-10-CM | POA: Diagnosis not present

## 2020-04-08 DIAGNOSIS — I214 Non-ST elevation (NSTEMI) myocardial infarction: Secondary | ICD-10-CM | POA: Diagnosis not present

## 2020-04-08 DIAGNOSIS — I0981 Rheumatic heart failure: Secondary | ICD-10-CM | POA: Diagnosis not present

## 2020-04-08 DIAGNOSIS — I2510 Atherosclerotic heart disease of native coronary artery without angina pectoris: Secondary | ICD-10-CM | POA: Diagnosis not present

## 2020-04-08 DIAGNOSIS — J9601 Acute respiratory failure with hypoxia: Secondary | ICD-10-CM | POA: Diagnosis not present

## 2020-04-08 DIAGNOSIS — I110 Hypertensive heart disease with heart failure: Secondary | ICD-10-CM | POA: Diagnosis not present

## 2020-04-09 DIAGNOSIS — I5033 Acute on chronic diastolic (congestive) heart failure: Secondary | ICD-10-CM | POA: Diagnosis not present

## 2020-04-09 DIAGNOSIS — J441 Chronic obstructive pulmonary disease with (acute) exacerbation: Secondary | ICD-10-CM | POA: Diagnosis not present

## 2020-04-09 DIAGNOSIS — E7800 Pure hypercholesterolemia, unspecified: Secondary | ICD-10-CM | POA: Diagnosis not present

## 2020-04-09 DIAGNOSIS — N179 Acute kidney failure, unspecified: Secondary | ICD-10-CM | POA: Diagnosis not present

## 2020-04-09 DIAGNOSIS — I110 Hypertensive heart disease with heart failure: Secondary | ICD-10-CM | POA: Diagnosis not present

## 2020-04-09 DIAGNOSIS — I0981 Rheumatic heart failure: Secondary | ICD-10-CM | POA: Diagnosis not present

## 2020-04-09 DIAGNOSIS — I214 Non-ST elevation (NSTEMI) myocardial infarction: Secondary | ICD-10-CM | POA: Diagnosis not present

## 2020-04-09 DIAGNOSIS — I10 Essential (primary) hypertension: Secondary | ICD-10-CM | POA: Diagnosis not present

## 2020-04-09 DIAGNOSIS — I2510 Atherosclerotic heart disease of native coronary artery without angina pectoris: Secondary | ICD-10-CM | POA: Diagnosis not present

## 2020-04-09 DIAGNOSIS — J9601 Acute respiratory failure with hypoxia: Secondary | ICD-10-CM | POA: Diagnosis not present

## 2020-04-11 ENCOUNTER — Other Ambulatory Visit: Payer: Self-pay | Admitting: Internal Medicine

## 2020-04-11 ENCOUNTER — Telehealth: Payer: Self-pay | Admitting: Internal Medicine

## 2020-04-11 DIAGNOSIS — R059 Cough, unspecified: Secondary | ICD-10-CM

## 2020-04-11 MED ORDER — PROMETHAZINE-DM 6.25-15 MG/5ML PO SYRP: 5.0000 mL | ORAL_SOLUTION | Freq: Four times a day (QID) | ORAL | 0 refills | Status: DC | PRN

## 2020-04-11 NOTE — Telephone Encounter (Signed)
I sent in a cough syrup that she should use sparingly.

## 2020-04-11 NOTE — Telephone Encounter (Signed)
Daughter Jill Hudson calling to ask if Dr Carolin Coy could recommend an OTC cough medicine? ( with all the meds pt is on, she does not want to go out and just buy something) She states pt has had a cough for some time, but it has gotten a lot "juicier" sounding.  Daughter Jill Hudson declined to make an appt for the pt.  Asked if I would mind just sending the message for now, since this has been an ongoing thing.  Also, would like to know if Dr Carolin Coy thinks pt would benefit by seeing a pulmonologist. If so, pt will need referral. Please call Jill Hudson on her cell phone.  [phone]

## 2020-04-11 NOTE — Telephone Encounter (Signed)
Called pts daughter Judson Roch and let her know that Dr. Army Melia sent in a cough syrup. Pt verbalized understanding.  KP

## 2020-04-12 DIAGNOSIS — I5032 Chronic diastolic (congestive) heart failure: Secondary | ICD-10-CM | POA: Insufficient documentation

## 2020-04-16 DIAGNOSIS — J441 Chronic obstructive pulmonary disease with (acute) exacerbation: Secondary | ICD-10-CM | POA: Diagnosis not present

## 2020-04-16 DIAGNOSIS — I214 Non-ST elevation (NSTEMI) myocardial infarction: Secondary | ICD-10-CM | POA: Diagnosis not present

## 2020-04-16 DIAGNOSIS — I2510 Atherosclerotic heart disease of native coronary artery without angina pectoris: Secondary | ICD-10-CM | POA: Diagnosis not present

## 2020-04-16 DIAGNOSIS — I110 Hypertensive heart disease with heart failure: Secondary | ICD-10-CM | POA: Diagnosis not present

## 2020-04-16 DIAGNOSIS — J9601 Acute respiratory failure with hypoxia: Secondary | ICD-10-CM | POA: Diagnosis not present

## 2020-04-16 DIAGNOSIS — I0981 Rheumatic heart failure: Secondary | ICD-10-CM | POA: Diagnosis not present

## 2020-04-18 ENCOUNTER — Ambulatory Visit
Admission: RE | Admit: 2020-04-18 | Discharge: 2020-04-18 | Disposition: A | Discharge status: Home / Self Care | Payer: Medicare Other | Source: Ambulatory Visit | Attending: Family Medicine | Admitting: Family Medicine

## 2020-04-18 ENCOUNTER — Other Ambulatory Visit: Payer: Self-pay

## 2020-04-18 ENCOUNTER — Ambulatory Visit (INDEPENDENT_AMBULATORY_CARE_PROVIDER_SITE_OTHER): Payer: Medicare Other

## 2020-04-18 ENCOUNTER — Telehealth: Payer: Self-pay | Admitting: Internal Medicine

## 2020-04-18 VITALS — BP 136/46 | HR 58 | Temp 97.7°F | Resp 18 | Ht 60.0 in | Wt 110.0 lb

## 2020-04-18 DIAGNOSIS — J441 Chronic obstructive pulmonary disease with (acute) exacerbation: Secondary | ICD-10-CM

## 2020-04-18 MED ORDER — AZITHROMYCIN 250 MG PO TABS: ORAL_TABLET | ORAL | 0 refills | Status: DC

## 2020-04-18 MED ORDER — PREDNISONE 50 MG PO TABS: ORAL_TABLET | ORAL | 0 refills | Status: DC

## 2020-04-18 NOTE — ED Provider Notes (Signed)
[facility]    CSN: [id number] Arrival date & time: 04/18/20  1247  History   Chief Complaint Chief Complaint  Patient presents with  . Cough   HPI 85 year old female with known COPD presents with cough.  Ongoing cough which has been worse for the past 2 weeks.  No documented fever.  Cough is productive.  Denies shortness of breath.  No relieving factors.  No reported sick contacts.  No other associated symptoms.  No other complaints.  Past Medical History:  Diagnosis Date  . Acute CHF (congestive heart failure) ([place]) 02/18/2020  . Acute diastolic CHF (congestive heart failure) ([place])   . Acute kidney injury superimposed on CKD ([place])   . Allergies   . Anxiety   . Arthritis    spine and shoulder  . Atherosclerosis of artery of extremity with rest pain ([place]) 10/12/2018  . Cancer ([place])    skin  . CHF (congestive heart failure) ([place])   . Diabetes mellitus without complication ([place])   . Heart murmur   . Hyperlipidemia   . Hypertension   . Macula lutea degeneration   . Mitral and aortic valve disease   . Myocardial infarction [facility])    may have had a "light" heart attack  . Non-ST elevation (NSTEMI) myocardial infarction ([place])   . Occasional tremors   . PAD (peripheral artery disease) ([place])   . Shingles    patient unaware but daughter confirms. it was a long time ago  . Stroke [facility]) 01/2017   may have had a slight stroke  . TIA (transient ischemic attack) 01/2017  . UTI (urinary tract infection)   . Vascular disease, peripheral [facility])     Patient Active Problem List   Diagnosis Date Noted  . AF (paroxysmal atrial fibrillation) ([place])   . Thrush   . COPD with acute exacerbation ([place])   . CKD (chronic kidney disease), stage IIIa 02/18/2020  . Malnutrition of mild degree ([place]) 01/08/2020  . Acute blood loss anemia   . Diverticulosis of large intestine with hemorrhage   . GI bleeding 12/25/2019  . Rectal bleeding   . Age-related macular degeneration, dry,  left eye 06/21/2019  . Age-related macular degeneration, wet, right eye ([place]) 06/21/2019  . Moderate nonproliferative diabetic retinopathy associated with type 2 diabetes mellitus ([place]) 06/21/2019  . Lumbosacral radiculopathy at L4 05/01/2019  . Underweight 05/01/2019  . Encounter for long-term (current) use of aspirin 10/31/2018  . Encounter for long-term (current) use of antiplatelets/antithrombotics 10/31/2018  . Long term current use of oral hypoglycemic drug 10/31/2018  . Encounter for long-term (current) use of insulin ([place]) 10/31/2018  . GIB (gastrointestinal bleeding) 08/11/2018  . Leg pain 07/03/2017  . Carpal tunnel syndrome on both sides 05/05/2017  . Myalgia due to HMG CoA reductase inhibitor 05/05/2017  . History of CVA (cerebrovascular accident) 02/15/2017  . Degenerative disc disease, lumbar 12/21/2016  . Atherosclerosis of native arteries of extremity with intermittent claudication ([place]) 12/20/2016  . Bilateral carotid artery stenosis 12/20/2016  . Hip bursitis 05/18/2016  . Elevated TSH 01/18/2016  . CKD stage 3 due to type 2 diabetes mellitus ([place]) 01/16/2016  . Senile ecchymosis 01/16/2016  . Type II diabetes mellitus with renal manifestations ([place]) 09/16/2015  . GERD (gastroesophageal reflux disease) 07/24/2015  . PVD (peripheral vascular disease) ([place]) 05/17/2015  . Neoplasm of uncertain behavior of skin 05/17/2015  . Retinopathy, diabetic, proliferative ([place]) 04/11/2015  . Hyperlipidemia 04/11/2015  . Essential hypertension 04/11/2015  . Generalized OA 04/11/2015  .  Arteriosclerosis of coronary artery 05/29/2013  . Hypertensive heart disease without CHF 05/29/2013    Past Surgical History:  Procedure Laterality Date  . CARDIAC CATHETERIZATION  1998   40% LM, 95% Ramus interm  . CATARACT EXTRACTION, BILATERAL    . COLONOSCOPY WITH PROPOFOL N/A 08/12/2018   Procedure: COLONOSCOPY WITH PROPOFOL;  Surgeon: Lucilla Lame, MD;  Location: [facility] ENDOSCOPY;  Service:  Endoscopy;  Laterality: N/A;  . ENDARTERECTOMY FEMORAL Left 10/12/2018   Procedure: ENDARTERECTOMY FEMORAL;  Surgeon: Katha Cabal, MD;  Location: [facility] ORS;  Service: Vascular;  Laterality: Left;  angioplasty and left SFA stent placement  . EYE SURGERY Bilateral    cataract extractions  . LOWER EXTREMITY ANGIOGRAPHY Left 08/23/2017   Procedure: Lower Extremity Angiography;  Surgeon: Algernon Huxley, MD;  Location: [place] CV LAB;  Service: Cardiovascular;  Laterality: Left;  . LOWER EXTREMITY ANGIOGRAPHY Left 07/26/2018   Procedure: LOWER EXTREMITY ANGIOGRAPHY;  Surgeon: Katha Cabal, MD;  Location: [place] CV LAB;  Service: Cardiovascular;  Laterality: Left;  . LOWER EXTREMITY ANGIOGRAPHY Left 09/16/2018   Procedure: LOWER EXTREMITY ANGIOGRAPHY;  Surgeon: Katha Cabal, MD;  Location: [place] CV LAB;  Service: Cardiovascular;  Laterality: Left;  . PTCA  08/2013   Left common iliac  . PTCA  12/2012   left ext iliac  . RIGHT HEART CATH N/A 02/23/2020   Procedure: RIGHT HEART CATH;  Surgeon: Minna Merritts, MD;  Location: [place] CV LAB;  Service: Cardiovascular;  Laterality: N/A;  . TUBAL LIGATION      OB History   No obstetric history on file.      Home Medications    Prior to Admission medications   Medication Sig Start Date End Date Taking? Authorizing Provider  acetaminophen (TYLENOL) 650 MG CR tablet Take 1,300 mg by mouth every 8 (eight) hours as needed for pain.   Yes [provider]  albuterol (VENTOLIN HFA) 108 (90 Base) MCG/ACT inhaler Inhale 2 puffs into the lungs every 6 (six) hours as needed for wheezing or shortness of breath. 02/26/20  Yes Wieting, Richard, MD  apixaban (ELIQUIS) 2.5 MG TABS tablet Take 1 tablet (2.5 mg total) by mouth 2 (two) times daily. 02/26/20  Yes Wieting, Richard, MD  Cyanocobalamin (RA VITAMIN B-12 TR) 1000 MCG TBCR Take 1,000 mcg by mouth daily.    Yes [provider]  feeding supplement,  GLUCERNA SHAKE, (GLUCERNA SHAKE) LIQD Take 237 mLs by mouth 3 (three) times daily between meals. 12/28/19  Yes Wieting, Richard, MD  ferrous sulfate 325 (65 FE) MG tablet Take 325 mg by mouth daily with breakfast.   Yes [provider]  gabapentin (NEURONTIN) 100 MG capsule Take 1 capsule (100 mg total) by mouth at bedtime. Patient taking differently: Take 100 mg by mouth 3 (three) times daily.  02/26/20  Yes Wieting, Richard, MD  insulin aspart (NOVOLOG FLEXPEN) 100 UNIT/ML FlexPen Inject 3 Units into the skin 3 (three) times daily with meals. At lunch Patient taking differently: Inject 20 Units into the skin daily. At breakfast- sliding scale 01/18/19  Yes [name], MD  Insulin Glargine (BASAGLAR KWIKPEN) 100 UNIT/ML SOPN Inject 0.12 mLs (12 Units total) into the skin daily. 06/27/19  Yes [name], MD  melatonin 5 MG TABS Take 0.5 tablets (2.5 mg total) by mouth at bedtime as needed (sleep). 02/26/20  Yes Wieting, Richard, MD  metoprolol tartrate (LOPRESSOR) 25 MG tablet Take 1 tablet (25 mg total) by mouth 2 (  two) times daily. 02/26/20  Yes Wieting, Richard, MD  Multiple Vitamins-Minerals (PRESERVISION AREDS PO) Take 1 capsule by mouth 2 (two) times daily.    Yes [provider]  rosuvastatin (CRESTOR) 5 MG tablet Take 5 mg by mouth every other day. Mon, Wed, Fri and Sat. 03/02/18  Yes [provider]  Tiotropium Bromide-Olodaterol 2.5-2.5 MCG/ACT AERS Inhale 2 puffs into the lungs daily. 02/26/20  Yes Wieting, Richard, MD  torsemide (DEMADEX) 20 MG tablet Take 2 tablets (40 mg total) by mouth daily. 02/27/20  Yes Loletha Grayer, MD  azithromycin (ZITHROMAX) 250 MG tablet 2 tablets on day 1, then 1 tablet daily on days 2-5. 04/18/20   Coral Spikes, DO  B-D ULTRAFINE III SHORT PEN 31G X 8 MM MISC USE AS DIRECTED 03/26/20   [name], MD  predniSONE (DELTASONE) 50 MG tablet 1 tablet daily x 5 days 04/18/20   Coral Spikes, DO    Family History Family History    Problem Relation Age of Onset  . Dementia Mother   . Diabetes Father     Social History Social History   Tobacco Use  . Smoking status: Former Smoker    Packs/day: 2.00    Years: 37.00    Pack years: 74.00    Types: Cigarettes    Quit date: 1980    Years since quitting: 41.5  . Smokeless tobacco: Never Used  . Tobacco comment: smoking cessation materials not required  Vaping Use  . Vaping Use: Never used  Substance Use Topics  . Alcohol use: No    Alcohol/week: 0.0 standard drinks  . Drug use: No     Allergies   Saxagliptin, Epinephrine, Atorvastatin, Codeine, Ezetimibe, Limonene, Nitrofurantoin, and Sulfa antibiotics   Review of Systems Review of Systems  Constitutional: Negative for fever.  Respiratory: Positive for cough.    Physical Exam Triage Vital Signs ED Triage Vitals  Enc Vitals Group     BP 04/18/20 1258 (!) 136/46     Pulse Rate 04/18/20 1258 (!) 58     Resp 04/18/20 1258 18     Temp 04/18/20 1258 97.7 F (36.5 C)     Temp Source 04/18/20 1258 Oral     SpO2 04/18/20 1258 100 %     Weight 04/18/20 1256 110 lb (49.9 kg)     Height 04/18/20 1256 5' (1.524 m)     Head Circumference --      Peak Flow --      Pain Score 04/18/20 1256 0     Pain Loc --      Pain Edu? --      Excl. in [place]? --    Updated Vital Signs BP (!) 136/46 (BP Location: Right Arm)   Pulse (!) 58   Temp 97.7 F (36.5 C) (Oral)   Resp 18   Ht 5' (1.524 m)   Wt 49.9 kg   SpO2 100%   BMI 21.48 kg/m   Visual Acuity Right Eye Distance:   Left Eye Distance:   Bilateral Distance:    Right Eye Near:   Left Eye Near:    Bilateral Near:     Physical Exam Vitals and nursing note reviewed.  Constitutional:      General: She is not in acute distress.    Appearance: Normal appearance. She is not ill-appearing.  HENT:     Head: Normocephalic and atraumatic.  Eyes:     General:        Right  eye: No discharge.        Left eye: No discharge.     Conjunctiva/sclera:  Conjunctivae normal.  Cardiovascular:     Rate and Rhythm: Normal rate and regular rhythm.  Pulmonary:     Effort: Pulmonary effort is normal.     Breath sounds: Normal breath sounds. No wheezing or rales.  Neurological:     Mental Status: She is alert.  Psychiatric:        Mood and Affect: Mood normal.        Behavior: Behavior normal.    UC Treatments / Results  Labs (all labs ordered are listed, but only abnormal results are displayed) Labs Reviewed - No data to display  EKG   Radiology DG Chest 2 View  Result Date: 04/18/2020 CLINICAL DATA:  Cough. EXAM: CHEST - 2 VIEW COMPARISON:  February 20, 2020. FINDINGS: The heart size and mediastinal contours are within normal limits. Both lungs are clear. Hyperexpansion of the lungs is noted. No pneumothorax or pleural effusion is noted. The visualized skeletal structures are unremarkable. IMPRESSION: No active cardiopulmonary disease. Hyperexpansion of the lungs. Aortic Atherosclerosis (ICD10-I70.0). Electronically Signed   By: Marijo Conception M.D.   On: 04/18/2020 13:34    Procedures Procedures (including critical care time)  Medications Ordered in UC Medications - No data to display  Initial Impression / Assessment and Plan / UC Course  I have reviewed the triage vital signs and the nursing notes.  Pertinent labs & imaging results that were available during my care of the patient were reviewed by me and considered in my medical decision making (see chart for details).    85 year old female presents with COPD exacerbation.  Treating with azithromycin and prednisone.  Final Clinical Impressions(s) / UC Diagnoses   Final diagnoses:  COPD exacerbation [facility])   Discharge Instructions   None    ED Prescriptions    Medication Sig Dispense Auth. Provider   azithromycin (ZITHROMAX) 250 MG tablet 2 tablets on day 1, then 1 tablet daily on days 2-5. 6 tablet Ripley Bogosian G, DO   predniSONE (DELTASONE) 50 MG tablet 1 tablet daily x  5 days 5 tablet Thersa Salt G, DO     PDMP not reviewed this encounter.   Coral Spikes, [place] 04/18/20 1449

## 2020-04-18 NOTE — Telephone Encounter (Unsigned)
Copied from [place] [id number]. Topic: General - Other >> Apr 18, 2020  9:22 AM Hinda Lenis D wrote: Reason for CRM: PT daughter needs to speak with a nurse / Pt still sick / no appt available soon with Dr Army Melia

## 2020-04-18 NOTE — Telephone Encounter (Signed)
Spoke to daughter, will take pt to [facility]

## 2020-04-18 NOTE — ED Triage Notes (Signed)
Patient c/o cough that started 2 weeks ago. Denies any other symptoms. Contacted PCP this morning and was advised to come to UC due to PCP not in the office.

## 2020-04-19 DIAGNOSIS — J9601 Acute respiratory failure with hypoxia: Secondary | ICD-10-CM | POA: Diagnosis not present

## 2020-04-19 DIAGNOSIS — I110 Hypertensive heart disease with heart failure: Secondary | ICD-10-CM | POA: Diagnosis not present

## 2020-04-19 DIAGNOSIS — J441 Chronic obstructive pulmonary disease with (acute) exacerbation: Secondary | ICD-10-CM | POA: Diagnosis not present

## 2020-04-19 DIAGNOSIS — I0981 Rheumatic heart failure: Secondary | ICD-10-CM | POA: Diagnosis not present

## 2020-04-19 DIAGNOSIS — I214 Non-ST elevation (NSTEMI) myocardial infarction: Secondary | ICD-10-CM | POA: Diagnosis not present

## 2020-04-19 DIAGNOSIS — I2510 Atherosclerotic heart disease of native coronary artery without angina pectoris: Secondary | ICD-10-CM | POA: Diagnosis not present

## 2020-04-23 DIAGNOSIS — I110 Hypertensive heart disease with heart failure: Secondary | ICD-10-CM | POA: Diagnosis not present

## 2020-04-23 DIAGNOSIS — J9601 Acute respiratory failure with hypoxia: Secondary | ICD-10-CM | POA: Diagnosis not present

## 2020-04-23 DIAGNOSIS — I0981 Rheumatic heart failure: Secondary | ICD-10-CM | POA: Diagnosis not present

## 2020-04-23 DIAGNOSIS — I214 Non-ST elevation (NSTEMI) myocardial infarction: Secondary | ICD-10-CM | POA: Diagnosis not present

## 2020-04-23 DIAGNOSIS — J441 Chronic obstructive pulmonary disease with (acute) exacerbation: Secondary | ICD-10-CM | POA: Diagnosis not present

## 2020-04-23 DIAGNOSIS — I2510 Atherosclerotic heart disease of native coronary artery without angina pectoris: Secondary | ICD-10-CM | POA: Diagnosis not present

## 2020-04-25 ENCOUNTER — Telehealth: Payer: Self-pay | Admitting: Internal Medicine

## 2020-04-25 DIAGNOSIS — J441 Chronic obstructive pulmonary disease with (acute) exacerbation: Secondary | ICD-10-CM | POA: Diagnosis not present

## 2020-04-25 DIAGNOSIS — I110 Hypertensive heart disease with heart failure: Secondary | ICD-10-CM | POA: Diagnosis not present

## 2020-04-25 DIAGNOSIS — J9601 Acute respiratory failure with hypoxia: Secondary | ICD-10-CM | POA: Diagnosis not present

## 2020-04-25 DIAGNOSIS — I214 Non-ST elevation (NSTEMI) myocardial infarction: Secondary | ICD-10-CM | POA: Diagnosis not present

## 2020-04-25 DIAGNOSIS — I2510 Atherosclerotic heart disease of native coronary artery without angina pectoris: Secondary | ICD-10-CM | POA: Diagnosis not present

## 2020-04-25 DIAGNOSIS — I0981 Rheumatic heart failure: Secondary | ICD-10-CM | POA: Diagnosis not present

## 2020-04-25 NOTE — Telephone Encounter (Signed)
Please advise  Copied from [place] [id number]. Topic: General - Other >> Apr 25, 2020  8:05 AM Oneta Rack wrote: Jill Hudson (daughter) called to schedule a EMS follow up with PCP due to patient BP being high, fatigue and slur speech. Patient conducted EKG which was normal. Daughter states she feels as if the predniSONE (DELTASONE) 50 MG tablet contributed to patient feeling fatigue. Patient in need of appt  6/30 BP before bed 11:30pm 196/61 7/1   BP this morning 148/58

## 2020-04-25 NOTE — Telephone Encounter (Signed)
Noted, thank you. I see patient scheduled a BP follow up on 05/02/20.  CM

## 2020-04-25 NOTE — Telephone Encounter (Signed)
Did relay message to daughter, she stated a home health nurse was there checking on her and stated pt is doing better. BP reading this morning was 114/63. Blood sugar was 217. Was advised to go to ER if pt was not doing good over the weekend. Daughter asked for appt dates next week.

## 2020-04-27 IMAGING — CR DG FOOT COMPLETE 3+V*L*
3 series · 3 of 3 positions shown · non-contrast
Comparison: None.

CLINICAL DATA: Stubbed foot on stool, bruising of the first through
the fourth MTP joints, pain in the third MTP joint

EXAM:
LEFT FOOT - COMPLETE 3+ VIEW

[foot ap]
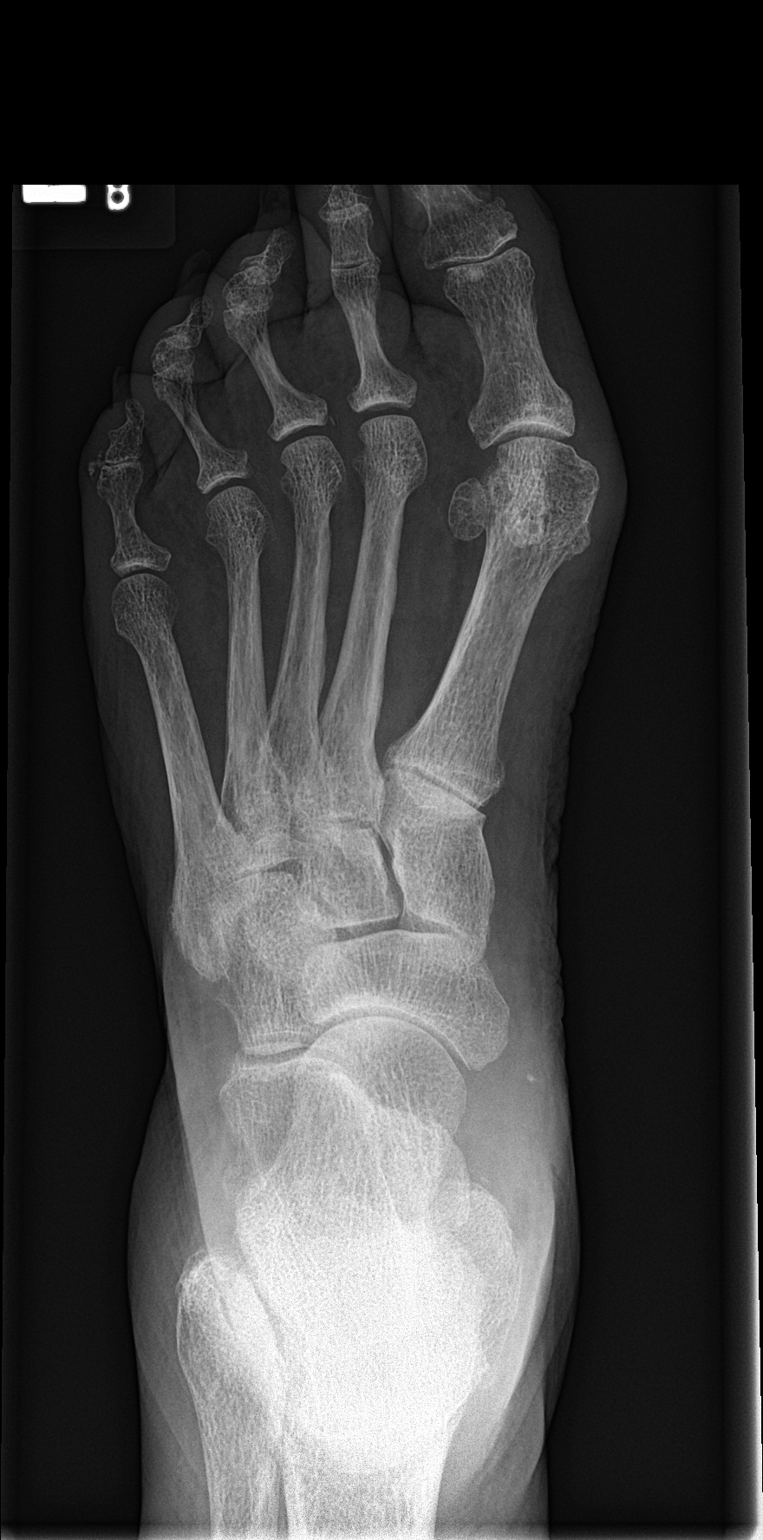

[foot obl]
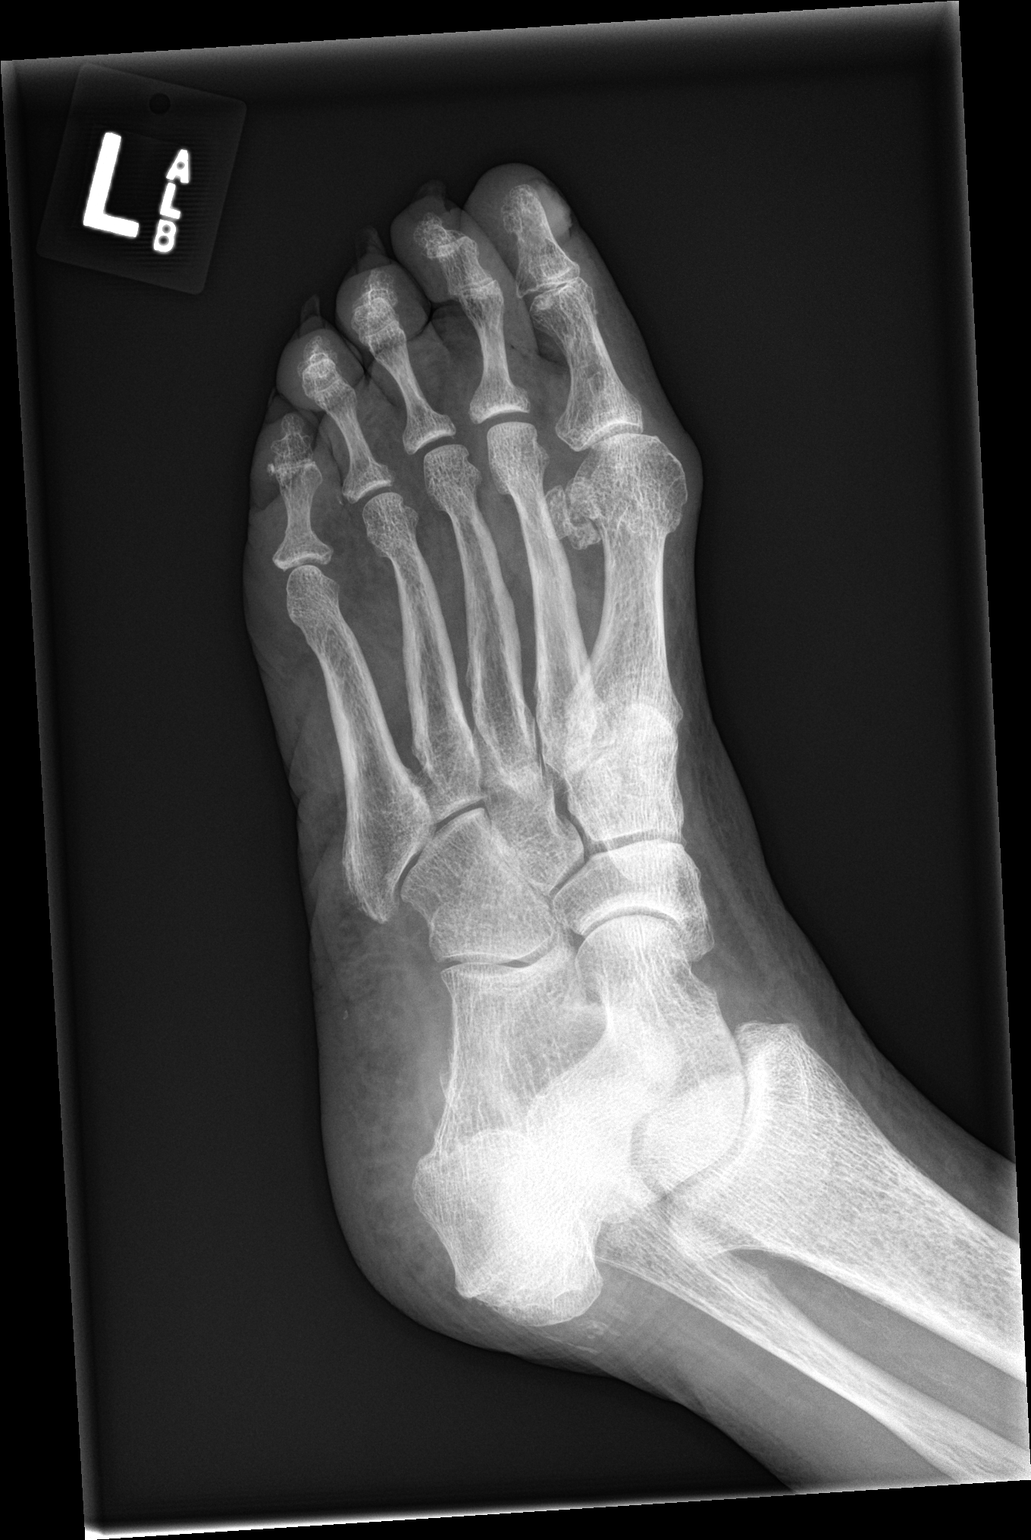

[foot lat]
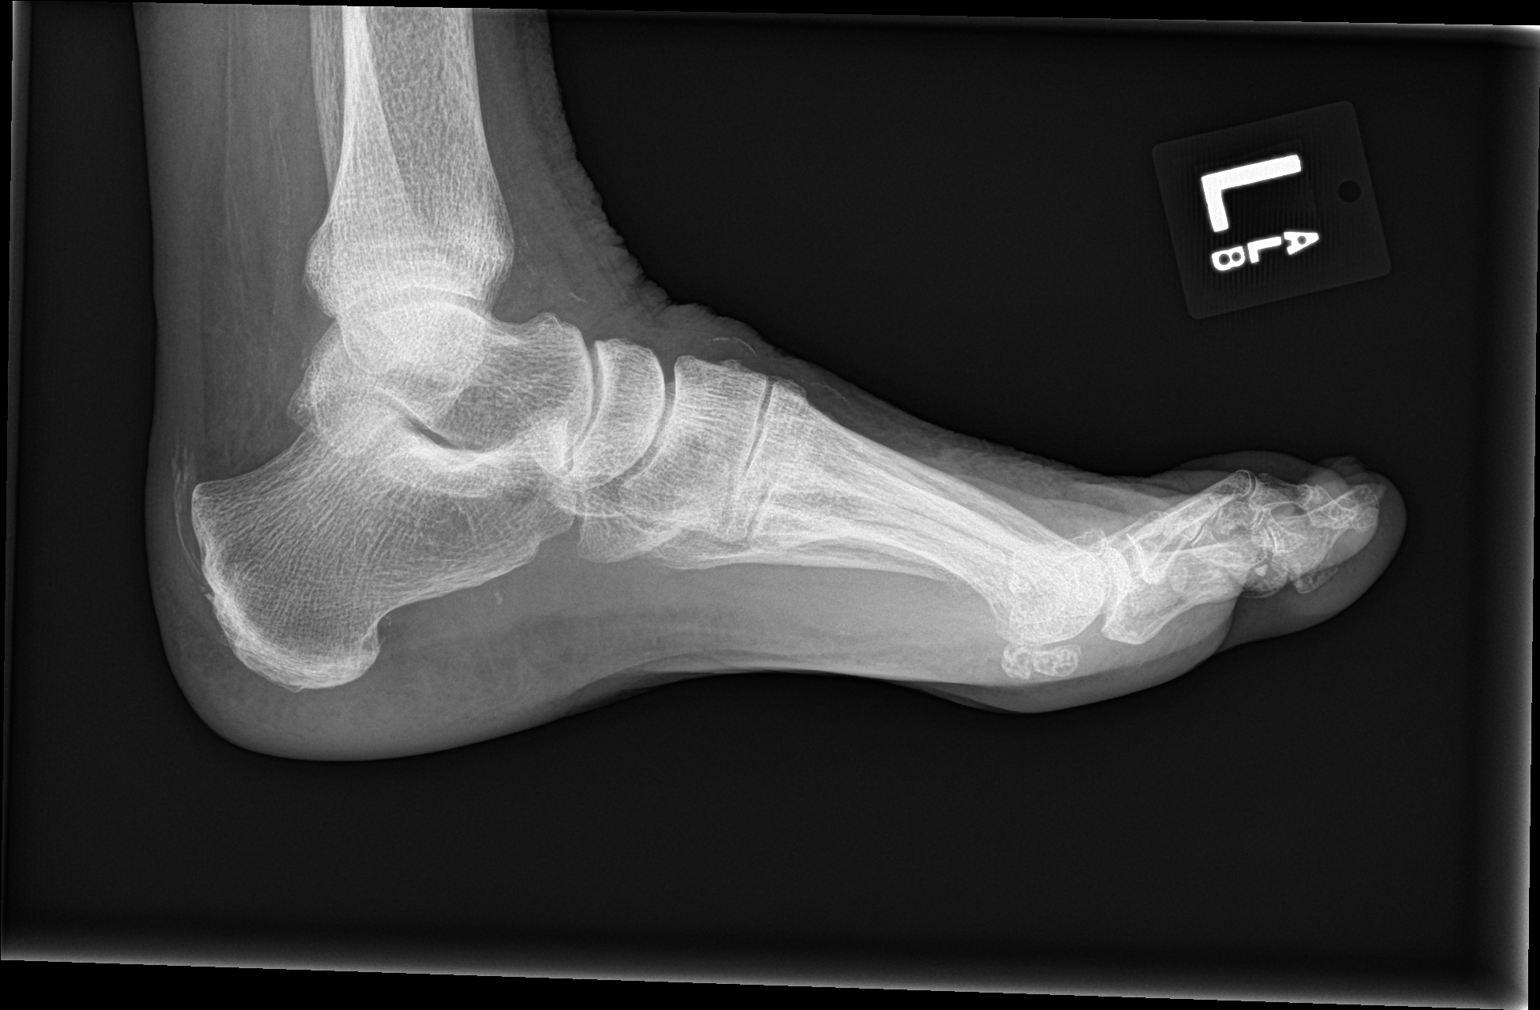

[3 of 3 positions shown; findings below may reference images not displayed]

FINDINGS: The bones are diffusely osteopenic. A tiny avulsion from the base of
the proximal phalanx of the left third toe cannot be excluded.
Otherwise no acute fracture is evident. There is degenerative change
of the left first MTP joint with mild hallux valgus present. Some
degenerative changes present in the midfoot. Some calcification of
the Achilles tendon is present. Arterial calcifications are noted.
IMPRESSION: 1. Possible small avulsion fracture fragment from the base of the
proximal phalanx of the left third toe. No other acute fracture.
2. Degenerative change of the left first MTP joint with mild hallux
valgus.

## 2020-04-30 DIAGNOSIS — I214 Non-ST elevation (NSTEMI) myocardial infarction: Secondary | ICD-10-CM | POA: Diagnosis not present

## 2020-04-30 DIAGNOSIS — J9601 Acute respiratory failure with hypoxia: Secondary | ICD-10-CM | POA: Diagnosis not present

## 2020-04-30 DIAGNOSIS — J441 Chronic obstructive pulmonary disease with (acute) exacerbation: Secondary | ICD-10-CM | POA: Diagnosis not present

## 2020-04-30 DIAGNOSIS — I0981 Rheumatic heart failure: Secondary | ICD-10-CM | POA: Diagnosis not present

## 2020-04-30 DIAGNOSIS — I2510 Atherosclerotic heart disease of native coronary artery without angina pectoris: Secondary | ICD-10-CM | POA: Diagnosis not present

## 2020-04-30 DIAGNOSIS — I110 Hypertensive heart disease with heart failure: Secondary | ICD-10-CM | POA: Diagnosis not present

## 2020-05-02 ENCOUNTER — Ambulatory Visit (INDEPENDENT_AMBULATORY_CARE_PROVIDER_SITE_OTHER): Payer: Medicare Other | Admitting: Internal Medicine

## 2020-05-02 ENCOUNTER — Other Ambulatory Visit: Payer: Self-pay

## 2020-05-02 ENCOUNTER — Encounter: Payer: Self-pay | Admitting: Internal Medicine

## 2020-05-02 VITALS — BP 132/40 | HR 66 | Temp 97.5°F | Ht 60.0 in | Wt 113.0 lb

## 2020-05-02 DIAGNOSIS — I10 Essential (primary) hypertension: Secondary | ICD-10-CM | POA: Diagnosis not present

## 2020-05-02 DIAGNOSIS — I480 Paroxysmal atrial fibrillation: Secondary | ICD-10-CM | POA: Diagnosis not present

## 2020-05-02 DIAGNOSIS — F39 Unspecified mood [affective] disorder: Secondary | ICD-10-CM | POA: Insufficient documentation

## 2020-05-02 DIAGNOSIS — I6523 Occlusion and stenosis of bilateral carotid arteries: Secondary | ICD-10-CM

## 2020-05-02 DIAGNOSIS — R05 Cough: Secondary | ICD-10-CM

## 2020-05-02 DIAGNOSIS — N1832 Chronic kidney disease, stage 3b: Secondary | ICD-10-CM | POA: Diagnosis not present

## 2020-05-02 DIAGNOSIS — R059 Cough, unspecified: Secondary | ICD-10-CM

## 2020-05-02 MED ORDER — PROMETHAZINE-DM 6.25-15 MG/5ML PO SYRP: 5.0000 mL | ORAL_SOLUTION | Freq: Four times a day (QID) | ORAL | 0 refills | Status: DC | PRN

## 2020-05-02 MED ORDER — APIXABAN 2.5 MG PO TABS: 2.5000 mg | ORAL_TABLET | Freq: Two times a day (BID) | ORAL | 5 refills | Status: DC

## 2020-05-02 NOTE — Progress Notes (Signed)
Date:  05/02/2020   Name:  Jill Hudson   DOB:  1934/10/31   MRN:  [id number]   Chief Complaint: Hypertension (follow up )  Hypertension This is a chronic problem. The problem has been waxing and waning since onset. Associated symptoms include shortness of breath. Pertinent negatives include no chest pain, headaches or palpitations. Past treatments include diuretics and beta blockers.  ACE and ARB as well as amlodipine were stopped at most recent hospitalization due to AKI.  She is here today for BP recheck and BMP. AM BP 322 systolic and PM BP 025-427 systolic. Diastolic always normal.  Mood changes - per daughter, she has been irritable and sometimes difficult to deal with.  Patient admits that she has a short temper at times with her husband and with her cats.  She is feeling a bit down but not severe.  She is eating fairly well, doing some walking for exercise and sleeping fair.  DM - she is overdue for follow up with Endocrinology.  Still on basal bolus insulin without any hypoglycemic episodes.  Lab Results  Component Value Date   CREATININE 1.72 (H) 03/18/2020   BUN 40 (H) 03/18/2020   NA 134 03/18/2020   K 5.1 03/18/2020   CL 95 (L) 03/18/2020   CO2 23 03/18/2020   Lab Results  Component Value Date   CHOL 117 02/19/2020   HDL 56 02/19/2020   LDLCALC 54 02/19/2020   TRIG 34 02/19/2020   CHOLHDL 2.1 02/19/2020   Lab Results  Component Value Date   TSH 3.512 12/25/2019   Lab Results  Component Value Date   HGBA1C 6.6 (H) 02/20/2020   Lab Results  Component Value Date   WBC 16.3 (H) 02/26/2020   HGB 10.7 (L) 02/26/2020   HCT 32.0 (L) 02/26/2020   MCV 89.6 02/26/2020   PLT 305 02/26/2020   Lab Results  Component Value Date   ALT 50 (H) 02/21/2020   AST 33 02/21/2020   ALKPHOS 78 02/21/2020   BILITOT 0.7 02/21/2020     Review of Systems  Constitutional: Positive for fatigue. Negative for appetite change, fever and unexpected weight change.  Respiratory:  Positive for cough and shortness of breath. Negative for chest tightness and wheezing.   Cardiovascular: Negative for chest pain, palpitations and leg swelling.  Gastrointestinal: Negative for abdominal pain, constipation and diarrhea.  Genitourinary: Negative for dysuria.  Neurological: Positive for light-headedness (sometimes upon standing). Negative for dizziness and headaches.  Psychiatric/Behavioral: Positive for dysphoric mood. Negative for sleep disturbance and suicidal ideas. The patient is nervous/anxious.     Patient Active Problem List   Diagnosis Date Noted  . AF (paroxysmal atrial fibrillation) ([place])   . Thrush   . COPD with acute exacerbation ([place])   . CKD (chronic kidney disease), stage IIIa 02/18/2020  . Malnutrition of mild degree ([place]) 01/08/2020  . Acute blood loss anemia   . Diverticulosis of large intestine with hemorrhage   . GI bleeding 12/25/2019  . Rectal bleeding   . Age-related macular degeneration, dry, left eye 06/21/2019  . Age-related macular degeneration, wet, right eye ([place]) 06/21/2019  . Moderate nonproliferative diabetic retinopathy associated with type 2 diabetes mellitus ([place]) 06/21/2019  . Lumbosacral radiculopathy at L4 05/01/2019  . Underweight 05/01/2019  . Encounter for long-term (current) use of aspirin 10/31/2018  . Encounter for long-term (current) use of antiplatelets/antithrombotics 10/31/2018  . Long term current use of oral hypoglycemic drug 10/31/2018  . Encounter for long-term (current)  use of insulin ([place]) 10/31/2018  . GIB (gastrointestinal bleeding) 08/11/2018  . Leg pain 07/03/2017  . Carpal tunnel syndrome on both sides 05/05/2017  . Myalgia due to HMG CoA reductase inhibitor 05/05/2017  . History of CVA (cerebrovascular accident) 02/15/2017  . Degenerative disc disease, lumbar 12/21/2016  . Atherosclerosis of native arteries of extremity with intermittent claudication ([place]) 12/20/2016  . Bilateral carotid artery stenosis  12/20/2016  . Hip bursitis 05/18/2016  . Elevated TSH 01/18/2016  . CKD stage 3 due to type 2 diabetes mellitus ([place]) 01/16/2016  . Senile ecchymosis 01/16/2016  . Type II diabetes mellitus with renal manifestations ([place]) 09/16/2015  . GERD (gastroesophageal reflux disease) 07/24/2015  . PVD (peripheral vascular disease) ([place]) 05/17/2015  . Neoplasm of uncertain behavior of skin 05/17/2015  . Retinopathy, diabetic, proliferative ([place]) 04/11/2015  . Hyperlipidemia 04/11/2015  . Essential hypertension 04/11/2015  . Generalized OA 04/11/2015  . Arteriosclerosis of coronary artery 05/29/2013  . Hypertensive heart disease without CHF 05/29/2013    Allergies  Allergen Reactions  . Saxagliptin Diarrhea  . Epinephrine Other (See Comments)    Patient does not remember what happens when she uses this  . Atorvastatin Other (See Comments)    Muscle aches  . Codeine Other (See Comments)    Upset stomach  . Ezetimibe Other (See Comments)    Myalgias(ZETIA)  . Limonene Rash    Patient does not recall this reaction  . Nitrofurantoin Rash and Other (See Comments)    Pruitus  . Sulfa Antibiotics Rash and Other (See Comments)    Sore mouth     Past Surgical History:  Procedure Laterality Date  . CARDIAC CATHETERIZATION  1998   40% LM, 95% Ramus interm  . CATARACT EXTRACTION, BILATERAL    . COLONOSCOPY WITH PROPOFOL N/A 08/12/2018   Procedure: COLONOSCOPY WITH PROPOFOL;  Surgeon: Lucilla Lame, MD;  Location: [facility] ENDOSCOPY;  Service: Endoscopy;  Laterality: N/A;  . ENDARTERECTOMY FEMORAL Left 10/12/2018   Procedure: ENDARTERECTOMY FEMORAL;  Surgeon: Katha Cabal, MD;  Location: [facility] ORS;  Service: Vascular;  Laterality: Left;  angioplasty and left SFA stent placement  . EYE SURGERY Bilateral    cataract extractions  . LOWER EXTREMITY ANGIOGRAPHY Left 08/23/2017   Procedure: Lower Extremity Angiography;  Surgeon: Algernon Huxley, MD;  Location: [place] [facility] LAB;  Service:  Cardiovascular;  Laterality: Left;  . LOWER EXTREMITY ANGIOGRAPHY Left 07/26/2018   Procedure: LOWER EXTREMITY ANGIOGRAPHY;  Surgeon: Katha Cabal, MD;  Location: [place] [facility];  Service: Cardiovascular;  Laterality: Left;  . LOWER EXTREMITY ANGIOGRAPHY Left 09/16/2018   Procedure: LOWER EXTREMITY ANGIOGRAPHY;  Surgeon: Katha Cabal, MD;  Location: [place] [facility];  Service: Cardiovascular;  Laterality: Left;  . PTCA  08/2013   Left common iliac  . PTCA  12/2012   left ext iliac  . RIGHT HEART CATH N/A 02/23/2020   Procedure: RIGHT HEART CATH;  Surgeon: Minna Merritts, MD;  Location: [place] [facility];  Service: Cardiovascular;  Laterality: N/A;  . TUBAL LIGATION      Social History   Tobacco Use  . Smoking status: Former Smoker    Packs/day: 2.00    Years: 37.00    Pack years: 74.00    Types: Cigarettes    Quit date: 1980    Years since quitting: 41.5  . Smokeless tobacco: Never Used  . Tobacco comment: smoking cessation materials not required  Vaping Use  . Vaping Use: Never used  Substance  Use Topics  . Alcohol use: No    Alcohol/week: 0.0 standard drinks  . Drug use: No     Medication list has been reviewed and updated.  Current Meds  Medication Sig  . acetaminophen (TYLENOL) 650 MG CR tablet Take 1,300 mg by mouth every 8 (eight) hours as needed for pain.  Marland Kitchen albuterol (VENTOLIN HFA) 108 (90 Base) MCG/ACT inhaler Inhale 2 puffs into the lungs every 6 (six) hours as needed for wheezing or shortness of breath.  Marland Kitchen apixaban (ELIQUIS) 2.5 MG TABS tablet Take 1 tablet (2.5 mg total) by mouth 2 (two) times daily.  . B-D ULTRAFINE III SHORT PEN 31G X 8 MM MISC USE AS DIRECTED  . Cyanocobalamin (RA VITAMIN B-12 TR) 1000 MCG TBCR Take 1,000 mcg by mouth daily.   . feeding supplement, GLUCERNA SHAKE, (GLUCERNA SHAKE) LIQD Take 237 mLs by mouth 3 (three) times daily between meals.  . ferrous sulfate 325 (65 FE) MG tablet Take 325 mg by mouth daily with  breakfast.  . gabapentin (NEURONTIN) 100 MG capsule Take 1 capsule (100 mg total) by mouth at bedtime. (Patient taking differently: Take 100 mg by mouth 3 (three) times daily. )  . insulin aspart (NOVOLOG FLEXPEN) 100 UNIT/ML FlexPen Inject 3 Units into the skin 3 (three) times daily with meals. At lunch (Patient taking differently: Inject 20 Units into the skin daily. At breakfast- sliding scale)  . Insulin Glargine (BASAGLAR KWIKPEN) 100 UNIT/ML SOPN Inject 0.12 mLs (12 Units total) into the skin daily.  . melatonin 5 MG TABS Take 0.5 tablets (2.5 mg total) by mouth at bedtime as needed (sleep).  . metoprolol tartrate (LOPRESSOR) 25 MG tablet Take 1 tablet (25 mg total) by mouth 2 (two) times daily.  . Multiple Vitamins-Minerals (PRESERVISION AREDS PO) Take 1 capsule by mouth 2 (two) times daily.   . rosuvastatin (CRESTOR) 5 MG tablet Take 5 mg by mouth every other day. Mon, Wed, Fri and Sat.  . Tiotropium Bromide-Olodaterol 2.5-2.5 MCG/ACT AERS Inhale 2 puffs into the lungs daily.  Marland Kitchen torsemide (DEMADEX) 20 MG tablet Take 2 tablets (40 mg total) by mouth daily.  . [DISCONTINUED] apixaban (ELIQUIS) 2.5 MG TABS tablet Take 1 tablet (2.5 mg total) by mouth 2 (two) times daily.    PHQ 2/9 Scores 05/02/2020 03/18/2020 01/08/2020 01/01/2020  PHQ - 2 Score 1 2 0 2  PHQ- 9 Score 3 10 0 3    GAD 7 : Generalized Anxiety Score 05/02/2020 03/18/2020 02/28/2020  Nervous, Anxious, on Edge 1 1 0  Control/stop worrying 1 2 0  Worry too much - different things 1 2 0  Trouble relaxing 1 0 0  Restless 0 0 0  Easily annoyed or irritable 2 0 0  Afraid - awful might happen 0 0 0  Total GAD 7 Score 6 5 0  Anxiety Difficulty Not difficult at all Not difficult at all Not difficult at all    BP Readings from Last 3 Encounters:  05/02/20 (!) 132/40  04/18/20 (!) 136/46  03/18/20 (!) 148/40    Physical Exam Vitals and nursing note reviewed.  Constitutional:      General: She is not in acute distress.     Appearance: She is well-developed.  HENT:     Head: Normocephalic and atraumatic.  Neck:     Thyroid: No thyromegaly.     Vascular: No carotid bruit.  Cardiovascular:     Rate and Rhythm: Normal rate and regular rhythm.  Pulses: Normal pulses.     Heart sounds: Normal heart sounds.  Pulmonary:     Effort: Pulmonary effort is normal. No respiratory distress.     Breath sounds: No wheezing.  Chest:     Breasts:        Right: No mass, nipple discharge, skin change or tenderness.        Left: No mass, nipple discharge, skin change or tenderness.  Abdominal:     General: Bowel sounds are normal.     Palpations: Abdomen is soft.     Tenderness: There is no abdominal tenderness.  Musculoskeletal:     Cervical back: Normal range of motion. No erythema.     Right lower leg: No edema.     Left lower leg: No edema.  Lymphadenopathy:     Cervical: No cervical adenopathy.  Skin:    General: Skin is warm and dry.     Capillary Refill: Capillary refill takes less than 2 seconds.     Findings: No rash.  Neurological:     General: No focal deficit present.     Mental Status: She is alert and oriented to person, place, and time.     Cranial Nerves: No cranial nerve deficit.     Sensory: No sensory deficit.     Motor: Weakness present.     Gait: Gait abnormal (uses cane ).     Deep Tendon Reflexes: Reflexes are normal and symmetric.  Psychiatric:        Attention and Perception: Attention normal.        Mood and Affect: Mood normal.        Speech: Speech normal.        Thought Content: Thought content does not include suicidal plan.        Cognition and Memory: Cognition normal.     Wt Readings from Last 3 Encounters:  05/02/20 113 lb (51.3 kg)  04/18/20 110 lb (49.9 kg)  03/18/20 114 lb 4 oz (51.8 kg)    BP (!) 132/40   Pulse 66   Temp (!) 97.5 F (36.4 C) (Oral)   Ht 5' (1.524 m)   Wt 113 lb (51.3 kg)   SpO2 98%   BMI 22.07 kg/m   Assessment and Plan: 1. Essential  hypertension Labile HTN readings with borderline low in the AM and high in the PM Continue current medications and follow up with Cardiology to discuss medication adjustments and addition of amlodipine if indicated  2. Stage 3b chronic kidney disease AKI noted on hospitalization Recheck today before starting new BP medication - Basic metabolic panel  3. Cough Due to COPD; interrupting sleep Will continue cough syrup at night only - promethazine-dextromethorphan (PROMETHAZINE-DM) 6.25-15 MG/5ML syrup; Take 5 mLs by mouth 4 (four) times daily as needed for cough.  Dispense: 180 mL; Refill: 0  4. AF (paroxysmal atrial fibrillation) (HCC) In SR today; followed closely by cardiology Continue anticoagulation with eliquis - apixaban (ELIQUIS) 2.5 MG TABS tablet; Take 1 tablet (2.5 mg total) by mouth 2 (two) times daily.  Dispense: 60 tablet; Refill: 5  5. Mood disorder ([place]) Mild changes noted today Discussed with patient and daughter - may not be necessary to begin medication Pt will monitor her sx and if worsening, consider zoloft or other SSRI   Partially dictated using Dragon software. Any errors are unintentional.  [name], MD [facility]  05/02/2020

## 2020-05-03 ENCOUNTER — Ambulatory Visit: Payer: Self-pay | Admitting: General Practice

## 2020-05-03 DIAGNOSIS — R059 Cough, unspecified: Secondary | ICD-10-CM

## 2020-05-03 DIAGNOSIS — N1832 Chronic kidney disease, stage 3b: Secondary | ICD-10-CM

## 2020-05-03 DIAGNOSIS — E1122 Type 2 diabetes mellitus with diabetic chronic kidney disease: Secondary | ICD-10-CM

## 2020-05-03 DIAGNOSIS — I10 Essential (primary) hypertension: Secondary | ICD-10-CM

## 2020-05-03 DIAGNOSIS — I5031 Acute diastolic (congestive) heart failure: Secondary | ICD-10-CM

## 2020-05-03 DIAGNOSIS — E782 Mixed hyperlipidemia: Secondary | ICD-10-CM

## 2020-05-03 DIAGNOSIS — N1831 Chronic kidney disease, stage 3a: Secondary | ICD-10-CM

## 2020-05-03 LAB — BASIC METABOLIC PANEL
BUN/Creatinine Ratio: 26 (ref 12–28)
BUN: 41 mg/dL — ABNORMAL HIGH (ref 8–27)
CO2: 27 mmol/L (ref 20–29)
Calcium: 9.5 mg/dL (ref 8.7–10.3)
Chloride: 91 mmol/L — ABNORMAL LOW (ref 96–106)
Creatinine, Ser: 1.57 mg/dL — ABNORMAL HIGH (ref 0.57–1.00)
GFR calc Af Amer: 35 mL/min/{1.73_m2} — ABNORMAL LOW (ref >59)
GFR calc non Af Amer: 30 mL/min/{1.73_m2} — ABNORMAL LOW (ref >59)
Glucose: 224 mg/dL — ABNORMAL HIGH (ref 65–99)
Potassium: 5.3 mmol/L — ABNORMAL HIGH (ref 3.5–5.2)
Sodium: 135 mmol/L (ref 134–144)

## 2020-05-03 NOTE — Patient Instructions (Signed)
Visit Information  Goals Addressed              This Visit's Progress   .  RNCM: Pt-"I had to go because i had a bad cough" (pt-stated)        Current Barriers:   Knowledge deficit related to basic COPD self care/management  Knowledge deficit related to basic understanding of how to use inhalers and how inhaled medications work  Knowledge deficit related to importance of energy conservation   Nurse Case Manager Clinical Goal(s):  Over the next 120 days patient will report using inhalers as prescribed including rinsing mouth after use  Over the next 120 days patient will report utilizing pursed lip breathing for shortness of breath  Over the next 120 days, patient will be able to verbalize understanding of COPD action plan and when to seek appropriate levels of medical care  Over the next 120 days, patient will engage in lite exercise as tolerated to build/regain stamina and strength and reduce shortness of breath through activity tolerance   Interventions:   Provided patient with basic written and verbal COPD education on self care/management/and exacerbation prevention   Provided patient with COPD action plan and reinforced importance of daily self assessment  Discussed ER visit on 04-18-2020 for COPD exacerbation and coughing. The patient verbalized that she had a bad cough. Review of taking medications as directed, staying hydrated, and elevation of head when sleeping at night. The patient verbalized she does well during the day but the coughing is worse at night. Education and support given. She states the medication the pcp has given her for her cough really helps her a lot.  Provided written and verbal instructions on pursed lip breathing and utilized returned demonstration as teach back  Evaluation of last appointment with pcp. The patient saw Dr. Army Melia on 05-02-2020.  The patient verbalized she has several upcoming appointments with specialist. The next being on  05-06-2020.    Patient Self Care Activities:   Take medications as prescribed including inhalers  Practice and use pursed lip breathing for shortness of breath recovery and prevention  Self assess COPD action plan zone and make appointment with provider if you have been in the yellow zone for 48 hours without improvement.  Engage in lite exercise as tolerated 3-5 days a week  Utilize infection prevention strategies to reduce risk of respiratory infection       .  RNCM: Pt-"I have to watch my weight closely" (pt-stated)        CARE PLAN ENTRY (see longitudinal plan of care for additional care plan information)  Current Barriers:  Marland Kitchen Knowledge deficit related to basic heart failure pathophysiology and self care management . Post discharge from the hospital on 02-26-2020 for patient stated "too much water in me"   Nurse Case Manager Clinical Goal(s):   Over the next 120 days, patient will weigh self daily and record  Over the next 120 days, patient will verbalize understanding of Heart Failure Action Plan and when to call doctor  Over the next 120 days, patient will take all Heart Failure mediations as prescribed  Interventions:  . Basic overview and discussion of pathophysiology of Heart Failure . Provided written and verbal education on low sodium diet.  Will send information by My Chart and EMMI about HF and how to prevent exacerbation. The patient endorses a heart healthy/ADA diet.  She is sad that she can not eat any more hot dogs; however she was able to tell the  RNCM healthy food choices to maintain her health and well being with HF/HTN/HLD/DM.  Review how eating salt causes the water to follow in the body. The patient reported cough and swelling in her left leg. 05-03-2020: The patient is doing well and says her ankles are "slim".  Sees the cardiologist next month.  . Reviewed Heart Failure Action Plan in depth.  The patient verbalized she is to call her provider if her weight is  less than 113 or greater than 116.  She had follow up with the cardiologist yesterday. She said the cardiology is pleased with her post hospital evaluation. The patient is also taking her blood pressure daily.  05-03-2020: weight today was 111.6.  She has taken her blood pressure daily but she could not read "Sara's writing".   . Assessed for scales in home.  Patient endorses working scales . Discussed importance of daily weight.  Education on weighing first thing every am after first void, with similar clothes for most accurate weight.  Have scales on flat surface.  Call the provider for outside given parameters of wt <113 or >116. Education given to patient on  the importance of weighing daily so subtle changes can be picked up on and intervention made if needed by the provider. 05-03-2020: The patient endorses weighing daily and most of the time BID.   Marland Kitchen Reviewed role of diuretics in prevention of fluid overload.  Education on taking medications as directed. Reviewed safety when taking diuretics.  . Evaluation of other post discharge needs. The patient verbalized understanding of discharge instructions. Has seen her cardiologist post discharge. Has an appointment with pcp on 03-18-2020.  The patient also has an eye appointment on 03-11-2020. 05-03-2020: The patient states that she was in the ER on 04-18-2020 for a cough but it was not related to her heart failure.   Patient Self Care Activities:  . Take Heart Failure Medications as prescribed . Weigh daily and record (notify MD with 3 lb weight gain over night or 5 lb in a week) Per the patient the MD wants her weight range to be 113-116. . Follow CHF Action Plan . Adhere to low sodium diet  Please see past updates related to this goal by clicking on the "Past Updates" button in the selected goal      .  RNCM: Pt-"My blood pressure was high this am so I called the doctor"        [place] (see longtitudinal plan of care for additional care plan  information)  Current Barriers:  . Chronic Disease Management support, education, and care coordination needs related to HTN, HLD, DMII, and CKD Stage 3  Clinical Goal(s) related to HTN, HLD, DMII, and CKD Stage 3 :  Over the next 120 days, patient will:  . Work with the care management team to address educational, disease management, and care coordination needs  . Begin or continue self health monitoring activities as directed today Measure and record cbg (blood glucose) 3 times daily, Measure and record blood pressure 5 times per week, and adhere to a Heart healthy/ADA diet . Call provider office for new or worsened signs and symptoms Blood glucose findings outside established parameters, Blood pressure findings outside established parameters, and New or worsened symptom related to CKD3 and HLD . Call care management team with questions or concerns . Verbalize basic understanding of patient centered plan of care established today  Interventions related to HTN, HLD, DMII, and CKD Stage 3 :  .  Evaluation of current treatment plans and patient's adherence to plan as established by provider . Assessed patient understanding of disease states.  The patient verbalized understanding of chronic conditions and endorses compliance with the plan of care . Assessed patient's education and care coordination needs. . Review of blood sugar readings. The patient verbalized fast this am was 145.  Marland Kitchen Review of blood pressure this am and her reading was 125/45- denies any issues with orthostatic hypotension at this time. Reviewed safety when ambulating. 05-03-2020: The patient has had her blood pressure taken today but she could not read the values that "Clarise Cruz had written down". She says she has issues with her vision.  She states that Clarise Cruz is keeping a good eye on it and normally Clarise Cruz is there but she had went this weekend to [facility] for a get a way and would not be back until Sunday. Clarise Cruz helps take care of the  patient and her husband, "Kermit".  . Provided disease specific education to patient- review of Heart healthy/ADA diet, discussed special consideration due to recent GI Bleed and patient was told to increase fiber. 05-03-2020: The patient has had no new issues related to GI bleed since last outreach.  [name] with appropriate clinical care team members regarding patient needs.  Denies needs from the CCM pharmacist or LCSW at this time.   Patient Self Care Activities related to HTN, HLD, DMII, and CKD Stage 3 :  . Patient is unable to independently self-manage chronic health conditions  Please see past updates related to this goal by clicking on the "Past Updates" button in the selected goal         Patient verbalizes understanding of instructions provided today.   The care management team will reach out to the patient again over the next 60 to 90 days.   Noreene Larsson RN, MSN, CCM Community Care Coordinator [facility] Mobile: [phone]   COPD and Physical Activity Chronic obstructive pulmonary disease (COPD) is a long-term (chronic) condition that affects the lungs. COPD is a general term that can be used to describe many different lung problems that cause lung swelling (inflammation) and limit airflow, including chronic bronchitis and emphysema. The main symptom of COPD is shortness of breath, which makes it harder to do even simple tasks. This can also make it harder to exercise and be active. Talk with your health care provider about treatments to help you breathe better and actions you can take to prevent breathing problems during physical activity. What are the benefits of exercising with COPD? Exercising regularly is an important part of a healthy lifestyle. You can still exercise and do physical activities even though you have COPD. Exercise and physical activity improve your shortness of breath by increasing blood flow  (circulation). This causes your heart to pump more oxygen through your body. Moderate exercise can improve your:  Oxygen use.  Energy level.  Shortness of breath.  Strength in your breathing muscles.  Heart health.  Sleep.  Self-esteem and feelings of self-worth.  Depression, stress, and anxiety levels. Exercise can benefit everyone with COPD. The severity of your disease may affect how hard you can exercise, especially at first, but everyone can benefit. Talk with your health care provider about how much exercise is safe for you, and which activities and exercises are safe for you. What actions can I take to prevent breathing problems during physical activity?  Sign up for a pulmonary rehabilitation program.  This type of program may include: ? Education about lung diseases. ? Exercise classes that teach you how to exercise and be more active while improving your breathing. This usually involves:  Exercise using your lower extremities, such as a stationary bicycle.  About 30 minutes of exercise, 2 to 5 times per week, for 6 to 12 weeks  Strength training, such as push ups or leg lifts. ? Nutrition education. ? Group classes in which you can talk with others who also have COPD and learn ways to manage stress.  If you use an oxygen tank, you should use it while you exercise. Work with your health care provider to adjust your oxygen for your physical activity. Your resting flow rate is different from your flow rate during physical activity.  While you are exercising: ? Take slow breaths. ? Pace yourself and do not try to go too fast. ? Purse your lips while breathing out. Pursing your lips is similar to a kissing or whistling position. ? If doing exercise that uses a quick burst of effort, such as weight lifting:  Breathe in before starting the exercise.  Breathe out during the hardest part of the exercise (such as raising the weights). Where to find support You can find  support for exercising with COPD from:  Your health care provider.  A pulmonary rehabilitation program.  Your local health department or community health programs.  Support groups, online or in-person. Your health care provider may be able to recommend support groups. Where to find more information You can find more information about exercising with COPD from:  American Lung Association: ClassInsider.se.  COPD Foundation: https://www.rivera.net/. Contact a health care provider if:  Your symptoms get worse.  You have chest pain.  You have nausea.  You have a fever.  You have trouble talking or catching your breath.  You want to start a new exercise program or a new activity. Summary  COPD is a general term that can be used to describe many different lung problems that cause lung swelling (inflammation) and limit airflow. This includes chronic bronchitis and emphysema.  Exercise and physical activity improve your shortness of breath by increasing blood flow (circulation). This causes your heart to provide more oxygen to your body.  Contact your health care provider before starting any exercise program or new activity. Ask your health care provider what exercises and activities are safe for you. This information is not intended to replace advice given to you by your health care provider. Make sure you discuss any questions you have with your health care provider. Document Revised: 02/01/2019 Document Reviewed: 11/04/2017 Elsevier Patient Education  [place].  Cough, Adult A cough helps to clear your throat and lungs. A cough may be a sign of an illness or another medical condition. An acute cough may only last 2-3 weeks, while a chronic cough may last 8 or more weeks. Many things can cause a cough. They include:  Germs (viruses or bacteria) that attack the airway.  Breathing in things that bother (irritate) your lungs.  Allergies.  Asthma.  Mucus that runs down the back  of your throat (postnasal drip).  Smoking.  Acid backing up from the stomach into the tube that moves food from the mouth to the stomach (gastroesophageal reflux).  Some medicines.  Lung problems.  Other medical conditions, such as heart failure or a blood clot in the lung (pulmonary embolism). Follow these instructions at home: Medicines  Take over-the-counter and prescription medicines only  as told by your doctor.  Talk with your doctor before you take medicines that stop a cough (coughsuppressants). Lifestyle   Do not smoke, and try not to be around smoke. Do not use any products that contain nicotine or tobacco, such as cigarettes, e-cigarettes, and chewing tobacco. If you need help quitting, ask your doctor.  Drink enough fluid to keep your pee (urine) pale yellow.  Avoid caffeine.  Do not drink alcohol if your doctor tells you not to drink. General instructions   Watch for any changes in your cough. Tell your doctor about them.  Always cover your mouth when you cough.  Stay away from things that make you cough, such as perfume, candles, campfire smoke, or cleaning products.  If the air is dry, use a cool mist vaporizer or humidifier in your home.  If your cough is worse at night, try using extra pillows to raise your head up higher while you sleep.  Rest as needed.  Keep all follow-up visits as told by your doctor. This is important. Contact a doctor if:  You have new symptoms.  You cough up pus.  Your cough does not get better after 2-3 weeks, or your cough gets worse.  Cough medicine does not help your cough and you are not sleeping well.  You have pain that gets worse or pain that is not helped with medicine.  You have a fever.  You are losing weight and you do not know why.  You have night sweats. Get help right away if:  You cough up blood.  You have trouble breathing.  Your heartbeat is very fast. These symptoms may be an emergency. Do  not wait to see if the symptoms will go away. Get medical help right away. Call your local emergency services (911 in the [place] Do not drive yourself to the hospital. Summary  A cough helps to clear your throat and lungs. Many things can cause a cough.  Take over-the-counter and prescription medicines only as told by your doctor.  Always cover your mouth when you cough.  Contact a doctor if you have new symptoms or you have a cough that does not get better or gets worse. This information is not intended to replace advice given to you by your health care provider. Make sure you discuss any questions you have with your health care provider. Document Revised: 10/31/2018 Document Reviewed: 10/31/2018 Elsevier Patient Education  [place].

## 2020-05-03 NOTE — Chronic Care Management (AMB) (Deleted)
  Chronic Care Management   Outreach Note  05/03/2020 Name: Jill Hudson MRN: [id number] DOB: [birth year]  Referred by: [name], MD Reason for referral : Chronic Care Management (Follow up: RNCM Chronic Disease Mangement and Care Coordination Needs)   An unsuccessful telephone outreach was attempted today. The patient was referred to the case management team for assistance with care management and care coordination. The patient verified HIPPA but then stated she could not talk right now because she had to get "Kermit" in the car and she would call back. No return call from the patient today.   Follow Up Plan: The care management team will reach out to the patient again over the next 30 to 60 days.   Noreene Larsson RN, MSN, [facility] Mobile: [phone]

## 2020-05-03 NOTE — Chronic Care Management (AMB) (Signed)
Chronic Care Management   Follow Up Note   05/03/2020 Name: Jill Hudson MRN: [id number] DOB: [birth year]  Referred by: [name], MD Reason for referral : Chronic Care Management (Follow up: RNCM Chronic Disease Mangement and Care Coordination Needs)   Jill Hudson is a 85 y.o. year old female who is a primary care patient of Army Melia Jesse Sans, MD. The CCM team was consulted for assistance with chronic disease management and care coordination needs.    Review of patient status, including review of consultants reports, relevant laboratory and other test results, and collaboration with appropriate care team members and the patient's provider was performed as part of comprehensive patient evaluation and provision of chronic care management services.    SDOH (Social Determinants of Health) assessments performed: Yes See Care Plan activities for detailed interventions related to [facility])     Outpatient Encounter Medications as of 05/03/2020  Medication Sig Note  . acetaminophen (TYLENOL) 650 MG CR tablet Take 1,300 mg by mouth every 8 (eight) hours as needed for pain.   Marland Kitchen albuterol (VENTOLIN HFA) 108 (90 Base) MCG/ACT inhaler Inhale 2 puffs into the lungs every 6 (six) hours as needed for wheezing or shortness of breath.   Marland Kitchen apixaban (ELIQUIS) 2.5 MG TABS tablet Take 1 tablet (2.5 mg total) by mouth 2 (two) times daily.   . B-D ULTRAFINE III SHORT PEN 31G X 8 MM MISC USE AS DIRECTED   . Cyanocobalamin (RA VITAMIN B-12 TR) 1000 MCG TBCR Take 1,000 mcg by mouth daily.    . feeding supplement, GLUCERNA SHAKE, (GLUCERNA SHAKE) LIQD Take 237 mLs by mouth 3 (three) times daily between meals.   . ferrous sulfate 325 (65 FE) MG tablet Take 325 mg by mouth daily with breakfast.   . gabapentin (NEURONTIN) 100 MG capsule Take 1 capsule (100 mg total) by mouth at bedtime. (Patient taking differently: Take 100 mg by mouth 3 (three) times daily. )   . insulin aspart (NOVOLOG FLEXPEN) 100 UNIT/ML FlexPen  Inject 3 Units into the skin 3 (three) times daily with meals. At lunch (Patient taking differently: Inject 20 Units into the skin daily. At breakfast- sliding scale) 02/28/2020: 3 units TIDAC  . Insulin Glargine (BASAGLAR KWIKPEN) 100 UNIT/ML SOPN Inject 0.12 mLs (12 Units total) into the skin daily.   . melatonin 5 MG TABS Take 0.5 tablets (2.5 mg total) by mouth at bedtime as needed (sleep).   . metoprolol tartrate (LOPRESSOR) 25 MG tablet Take 1 tablet (25 mg total) by mouth 2 (two) times daily.   . Multiple Vitamins-Minerals (PRESERVISION AREDS PO) Take 1 capsule by mouth 2 (two) times daily.    . promethazine-dextromethorphan (PROMETHAZINE-DM) 6.25-15 MG/5ML syrup Take 5 mLs by mouth 4 (four) times daily as needed for cough.   . rosuvastatin (CRESTOR) 5 MG tablet Take 5 mg by mouth every other day. Mon, Wed, Fri and Sat.   . Tiotropium Bromide-Olodaterol 2.5-2.5 MCG/ACT AERS Inhale 2 puffs into the lungs daily. 02/28/2020: Has not been able to fill yet  . torsemide (DEMADEX) 20 MG tablet Take 2 tablets (40 mg total) by mouth daily.    No facility-administered encounter medications on file as of 05/03/2020.     Objective:  BP Readings from Last 3 Encounters:  05/02/20 (!) 132/40  04/18/20 (!) 136/46  03/18/20 (!) 148/40   Lab Results  Component Value Date   HGBA1C 6.6 (H) 02/20/2020    Goals Addressed  This Visit's Progress   .  RNCM: Pt-"I had to go because i had a bad cough" (pt-stated)        Current Barriers:   Knowledge deficit related to basic COPD self care/management  Knowledge deficit related to basic understanding of how to use inhalers and how inhaled medications work  Knowledge deficit related to importance of energy conservation   Nurse Case Manager Clinical Goal(s):  Over the next 120 days patient will report using inhalers as prescribed including rinsing mouth after use  Over the next 120 days patient will report utilizing pursed lip breathing for  shortness of breath  Over the next 120 days, patient will be able to verbalize understanding of COPD action plan and when to seek appropriate levels of medical care  Over the next 120 days, patient will engage in lite exercise as tolerated to build/regain stamina and strength and reduce shortness of breath through activity tolerance   Interventions:   Provided patient with basic written and verbal COPD education on self care/management/and exacerbation prevention   Provided patient with COPD action plan and reinforced importance of daily self assessment  Discussed ER visit on 04-18-2020 for COPD exacerbation and coughing. The patient verbalized that she had a bad cough. Review of taking medications as directed, staying hydrated, and elevation of head when sleeping at night. The patient verbalized she does well during the day but the coughing is worse at night. Education and support given. She states the medication the pcp has given her for her cough really helps her a lot.  Provided written and verbal instructions on pursed lip breathing and utilized returned demonstration as teach back  Evaluation of last appointment with pcp. The patient saw Dr. Army Melia on 05-02-2020.  The patient verbalized she has several upcoming appointments with specialist. The next being on 05-06-2020.    Patient Self Care Activities:   Take medications as prescribed including inhalers  Practice and use pursed lip breathing for shortness of breath recovery and prevention  Self assess COPD action plan zone and make appointment with provider if you have been in the yellow zone for 48 hours without improvement.  Engage in lite exercise as tolerated 3-5 days a week  Utilize infection prevention strategies to reduce risk of respiratory infection       .  RNCM: Pt-"I have to watch my weight closely" (pt-stated)        CARE PLAN ENTRY (see longitudinal plan of care for additional care plan information)  Current  Barriers:  Marland Kitchen Knowledge deficit related to basic heart failure pathophysiology and self care management . Post discharge from the hospital on 02-26-2020 for patient stated "too much water in me"   Nurse Case Manager Clinical Goal(s):   Over the next 120 days, patient will weigh self daily and record  Over the next 120 days, patient will verbalize understanding of Heart Failure Action Plan and when to call doctor  Over the next 120 days, patient will take all Heart Failure mediations as prescribed  Interventions:  . Basic overview and discussion of pathophysiology of Heart Failure . Provided written and verbal education on low sodium diet.  Will send information by My Chart and EMMI about HF and how to prevent exacerbation. The patient endorses a heart healthy/ADA diet.  She is sad that she can not eat any more hot dogs; however she was able to tell the RNCM healthy food choices to maintain her health and well being with HF/HTN/HLD/DM.  Review how eating  salt causes the water to follow in the body. The patient reported cough and swelling in her left leg. 05-03-2020: The patient is doing well and says her ankles are "slim".  Sees the cardiologist next month.  . Reviewed Heart Failure Action Plan in depth.  The patient verbalized she is to call her provider if her weight is less than 113 or greater than 116.  She had follow up with the cardiologist yesterday. She said the cardiology is pleased with her post hospital evaluation. The patient is also taking her blood pressure daily.  05-03-2020: weight today was 111.6.  She has taken her blood pressure daily but she could not read "Sara's writing".   . Assessed for scales in home.  Patient endorses working scales . Discussed importance of daily weight.  Education on weighing first thing every am after first void, with similar clothes for most accurate weight.  Have scales on flat surface.  Call the provider for outside given parameters of wt <113 or >116.  Education given to patient on  the importance of weighing daily so subtle changes can be picked up on and intervention made if needed by the provider. 05-03-2020: The patient endorses weighing daily and most of the time BID.   Marland Kitchen Reviewed role of diuretics in prevention of fluid overload.  Education on taking medications as directed. Reviewed safety when taking diuretics.  . Evaluation of other post discharge needs. The patient verbalized understanding of discharge instructions. Has seen her cardiologist post discharge. Has an appointment with pcp on 03-18-2020.  The patient also has an eye appointment on 03-11-2020. 05-03-2020: The patient states that she was in the ER on 04-18-2020 for a cough but it was not related to her heart failure.   Patient Self Care Activities:  . Take Heart Failure Medications as prescribed . Weigh daily and record (notify MD with 3 lb weight gain over night or 5 lb in a week) Per the patient the MD wants her weight range to be 113-116. . Follow CHF Action Plan . Adhere to low sodium diet  Please see past updates related to this goal by clicking on the "Past Updates" button in the selected goal      .  RNCM: Pt-"My blood pressure was high this am so I called the doctor"        [place] (see longtitudinal plan of care for additional care plan information)  Current Barriers:  . Chronic Disease Management support, education, and care coordination needs related to HTN, HLD, DMII, and CKD Stage 3  Clinical Goal(s) related to HTN, HLD, DMII, and CKD Stage 3 :  Over the next 120 days, patient will:  . Work with the care management team to address educational, disease management, and care coordination needs  . Begin or continue self health monitoring activities as directed today Measure and record cbg (blood glucose) 3 times daily, Measure and record blood pressure 5 times per week, and adhere to a Heart healthy/ADA diet . Call provider office for new or worsened signs and  symptoms Blood glucose findings outside established parameters, Blood pressure findings outside established parameters, and New or worsened symptom related to CKD3 and HLD . Call care management team with questions or concerns . Verbalize basic understanding of patient centered plan of care established today  Interventions related to HTN, HLD, DMII, and CKD Stage 3 :  . Evaluation of current treatment plans and patient's adherence to plan as established by provider . Assessed patient  understanding of disease states.  The patient verbalized understanding of chronic conditions and endorses compliance with the plan of care . Assessed patient's education and care coordination needs. . Review of blood sugar readings. The patient verbalized fast this am was 145.  Marland Kitchen Review of blood pressure this am and her reading was 125/45- denies any issues with orthostatic hypotension at this time. Reviewed safety when ambulating. 05-03-2020: The patient has had her blood pressure taken today but she could not read the values that "Clarise Cruz had written down". She says she has issues with her vision.  She states that Clarise Cruz is keeping a good eye on it and normally Clarise Cruz is there but she had went this weekend to [facility] for a get a way and would not be back until Sunday. Clarise Cruz helps take care of the patient and her husband, "Kermit".  . Provided disease specific education to patient- review of Heart healthy/ADA diet, discussed special consideration due to recent GI Bleed and patient was told to increase fiber. 05-03-2020: The patient has had no new issues related to GI bleed since last outreach.  [name] with appropriate clinical care team members regarding patient needs.  Denies needs from the CCM pharmacist or LCSW at this time.   Patient Self Care Activities related to HTN, HLD, DMII, and CKD Stage 3 :  . Patient is unable to independently self-manage chronic health conditions  Please see past updates related to this  goal by clicking on the "Past Updates" button in the selected goal          Plan:   The care management team will reach out to the patient again over the next 60 to 90 days.    Noreene Larsson RN, MSN, [facility] Mobile: [phone]

## 2020-05-05 NOTE — Progress Notes (Signed)
Patient ID: Jill Hudson, female    DOB: 1935/07/11, 85 y.o.   MRN: [id number]  HPI  Ms Pinkstaff is a 85 y/o female with a history of DM, hyperlipidemia, HTN, stroke, anxiety, vascular disease, TIA, PAD, previous tobacco use and chronic heart failure.   Echo report from 02/19/20 reviewed and showed an EF of 55-60% along with mild LVH, moderately elevated PA Pressure and moderate MR.   Catheterization done 02/23/20 showed:  Hemodynamic findings consistent with pulmonary hypertension.  Was in the ED 04/18/20 due to COPD exacerbation where she was treated and released. Admitted 02/18/20 due to acute hypoxic respiratory failure. Initially needed oxygen but was then able to be weaned off of it. Cardiology and nephrology consults obtained. NSTEMI but conservative management recommended. Given nebulizers and steroids due to COPD exacerbation. Initially given IV lasix with transition to oral diuretics. Discharged after 8 days. Was in the ED 01/06/20 due to mechanical fall. [place] out of bed and hit her head on the dresser drawer. Head CT obtained as patient on anticoagulation which was negative and patient was released.   She presents today for a follow-up visit with a chief complaint of minimal fatigue upon moderate exertion. She describes this as chronic in nature having been present for several years. She has associated cough, shortness of breath, light-headedness and fluctuating weight. She denies any difficulty sleeping, abdominal distention, palpitations, pedal edema or chest pain.     Past Medical History:  Diagnosis Date  . Acute CHF (congestive heart failure) ([place]) 02/18/2020  . Acute diastolic CHF (congestive heart failure) ([place])   . Acute kidney injury superimposed on CKD ([place])   . Allergies   . Anxiety   . Arthritis    spine and shoulder  . Atherosclerosis of artery of extremity with rest pain ([place]) 10/12/2018  . Cancer ([place])    skin  . CHF (congestive heart failure) ([place])   . Diabetes  mellitus without complication ([place])   . Heart murmur   . Hyperlipidemia   . Hypertension   . Macula lutea degeneration   . Mitral and aortic valve disease   . Myocardial infarction [facility])    may have had a "light" heart attack  . Non-ST elevation (NSTEMI) myocardial infarction ([place])   . Occasional tremors   . PAD (peripheral artery disease) ([place])   . Shingles    patient unaware but daughter confirms. it was a long time ago  . Stroke [facility]) 01/2017   may have had a slight stroke  . TIA (transient ischemic attack) 01/2017  . UTI (urinary tract infection)   . Vascular disease, peripheral [facility])    Past Surgical History:  Procedure Laterality Date  . CARDIAC CATHETERIZATION  1998   40% LM, 95% Ramus interm  . CATARACT EXTRACTION, BILATERAL    . COLONOSCOPY WITH PROPOFOL N/A 08/12/2018   Procedure: COLONOSCOPY WITH PROPOFOL;  Surgeon: Lucilla Lame, MD;  Location: [facility] ENDOSCOPY;  Service: Endoscopy;  Laterality: N/A;  . ENDARTERECTOMY FEMORAL Left 10/12/2018   Procedure: ENDARTERECTOMY FEMORAL;  Surgeon: Katha Cabal, MD;  Location: [facility] ORS;  Service: Vascular;  Laterality: Left;  angioplasty and left SFA stent placement  . EYE SURGERY Bilateral    cataract extractions  . LOWER EXTREMITY ANGIOGRAPHY Left 08/23/2017   Procedure: Lower Extremity Angiography;  Surgeon: Algernon Huxley, MD;  Location: [place] CV LAB;  Service: Cardiovascular;  Laterality: Left;  . LOWER EXTREMITY ANGIOGRAPHY Left 07/26/2018   Procedure: LOWER EXTREMITY ANGIOGRAPHY;  Surgeon: [name],  Dolores Lory, MD;  Location: [place] CV LAB;  Service: Cardiovascular;  Laterality: Left;  . LOWER EXTREMITY ANGIOGRAPHY Left 09/16/2018   Procedure: LOWER EXTREMITY ANGIOGRAPHY;  Surgeon: Katha Cabal, MD;  Location: [place] CV LAB;  Service: Cardiovascular;  Laterality: Left;  . PTCA  08/2013   Left common iliac  . PTCA  12/2012   left ext iliac  . RIGHT HEART CATH N/A 02/23/2020   Procedure: RIGHT  HEART CATH;  Surgeon: Minna Merritts, MD;  Location: [place] CV LAB;  Service: Cardiovascular;  Laterality: N/A;  . TUBAL LIGATION     Family History  Problem Relation Age of Onset  . Dementia Mother   . Diabetes Father    Social History   Tobacco Use  . Smoking status: Former Smoker    Packs/day: 2.00    Years: 37.00    Pack years: 74.00    Types: Cigarettes    Quit date: 1980    Years since quitting: 41.5  . Smokeless tobacco: Never Used  . Tobacco comment: smoking cessation materials not required  Substance Use Topics  . Alcohol use: No    Alcohol/week: 0.0 standard drinks   Allergies  Allergen Reactions  . Saxagliptin Diarrhea  . Epinephrine Other (See Comments)    Patient does not remember what happens when she uses this  . Atorvastatin Other (See Comments)    Muscle aches  . Codeine Other (See Comments)    Upset stomach  . Ezetimibe Other (See Comments)    Myalgias(ZETIA)  . Limonene Rash    Patient does not recall this reaction  . Nitrofurantoin Rash and Other (See Comments)    Pruitus  . Sulfa Antibiotics Rash and Other (See Comments)    Sore mouth    Prior to Admission medications   Medication Sig Start Date End Date Taking? Authorizing Provider  acetaminophen (TYLENOL) 650 MG CR tablet Take 1,300 mg by mouth every 8 (eight) hours as needed for pain.   Yes [provider]  albuterol (VENTOLIN HFA) 108 (90 Base) MCG/ACT inhaler Inhale 2 puffs into the lungs every 6 (six) hours as needed for wheezing or shortness of breath. 02/26/20  Yes Wieting, Richard, MD  apixaban (ELIQUIS) 2.5 MG TABS tablet Take 1 tablet (2.5 mg total) by mouth 2 (two) times daily. 05/02/20  Yes [name], MD  B-D ULTRAFINE III SHORT PEN 31G X 8 MM MISC USE AS DIRECTED 03/26/20  Yes [name], MD  Cyanocobalamin (RA VITAMIN B-12 TR) 1000 MCG TBCR Take 1,000 mcg by mouth daily.    Yes [provider]  feeding supplement, GLUCERNA SHAKE, (GLUCERNA SHAKE)  LIQD Take 237 mLs by mouth 3 (three) times daily between meals. 12/28/19  Yes Wieting, Richard, MD  ferrous sulfate 325 (65 FE) MG tablet Take 325 mg by mouth daily with breakfast.   Yes [provider]  gabapentin (NEURONTIN) 100 MG capsule Take 1 capsule (100 mg total) by mouth at bedtime. Patient taking differently: Take 100 mg by mouth 3 (three) times daily.  02/26/20  Yes Wieting, Richard, MD  insulin aspart (NOVOLOG FLEXPEN) 100 UNIT/ML FlexPen Inject 3 Units into the skin 3 (three) times daily with meals. At lunch Patient taking differently: Inject 20 Units into the skin daily. At breakfast- sliding scale 01/18/19  Yes [name], MD  Insulin Glargine (BASAGLAR KWIKPEN) 100 UNIT/ML SOPN Inject 0.12 mLs (12 Units total) into the skin daily. 06/27/19  Yes [name], MD  melatonin 5 MG TABS Take 0.5 tablets (2.5 mg total) by mouth at bedtime as needed (sleep). 02/26/20  Yes Wieting, Richard, MD  metoprolol tartrate (LOPRESSOR) 25 MG tablet Take 1 tablet (25 mg total) by mouth 2 (two) times daily. 02/26/20  Yes Wieting, Richard, MD  Multiple Vitamins-Minerals (PRESERVISION AREDS PO) Take 1 capsule by mouth 2 (two) times daily.    Yes [provider]  promethazine-dextromethorphan (PROMETHAZINE-DM) 6.25-15 MG/5ML syrup Take 5 mLs by mouth 4 (four) times daily as needed for cough. 05/02/20  Yes [name], MD  rosuvastatin (CRESTOR) 5 MG tablet Take 5 mg by mouth every other day. Mon, Wed, Fri and Sat. 03/02/18  Yes [provider]  torsemide (DEMADEX) 20 MG tablet Take 2 tablets (40 mg total) by mouth daily. 02/27/20  Yes Loletha Grayer, MD  Tiotropium Bromide-Olodaterol 2.5-2.5 MCG/ACT AERS Inhale 2 puffs into the lungs daily. Patient not taking: Reported on 05/06/2020 02/26/20   Loletha Grayer, MD     Review of Systems  Constitutional: Positive for appetite change (decreased) and fatigue (little bit).  HENT: Negative for congestion, postnasal drip and sore  throat.   Eyes: Negative.   Respiratory: Positive for cough and shortness of breath.   Cardiovascular: Negative for chest pain, palpitations and leg swelling.  Gastrointestinal: Negative for abdominal distention and abdominal pain.  Endocrine: Negative.   Genitourinary: Negative.   Musculoskeletal: Negative for back pain and neck pain.  Skin: Negative.   Allergic/Immunologic: Negative.   Neurological: Positive for light-headedness (off balance). Negative for dizziness.  Hematological: Negative for adenopathy. Bruises/bleeds easily.  Psychiatric/Behavioral: Negative for dysphoric mood and sleep disturbance (sleeping on 2 pillows). The patient is not nervous/anxious.    Vitals:   05/06/20 1308  BP: (!) 144/36  Pulse: 63  Resp: 18  SpO2: 98%  Weight: 114 lb 2 oz (51.8 kg)  Height: 5' (1.524 m)   Wt Readings from Last 3 Encounters:  05/06/20 114 lb 2 oz (51.8 kg)  05/02/20 113 lb (51.3 kg)  04/18/20 110 lb (49.9 kg)   Lab Results  Component Value Date   CREATININE 1.57 (H) 05/02/2020   CREATININE 1.72 (H) 03/18/2020   CREATININE 2.40 (H) 02/26/2020    Physical Exam Vitals and nursing note reviewed.  Constitutional:      Appearance: Normal appearance.  HENT:     Head: Normocephalic and atraumatic.  Cardiovascular:     Rate and Rhythm: Normal rate and regular rhythm.  Pulmonary:     Effort: Pulmonary effort is normal. No respiratory distress.     Breath sounds: No wheezing or rales.  Abdominal:     General: Abdomen is flat.     Palpations: Abdomen is soft.     Tenderness: There is no abdominal tenderness.  Musculoskeletal:        General: No tenderness.     Cervical back: Normal range of motion and neck supple.     Right lower leg: No edema.     Left lower leg: No edema.  Skin:    General: Skin is warm and dry.  Neurological:     General: No focal deficit present.     Mental Status: She is alert and oriented to person, place, and time.  Psychiatric:        Mood  and Affect: Mood normal.        Behavior: Behavior normal.    Assessment & Plan:  1: Chronic heart failure with preserved ejection fraction with structural changes- - NYHA  class II - euvolemic today - weighing daily and says that her weight has fluctuated some as she's recently finished taking prednisone; reminded to call for an overnight weight gain of >2 pounds or a weekly weight gain of >5 pounds - weight stable from last visit here 2 months ago - not adding salt and daughter has been reading food labels; reviewed the importance of closely following a low sodium diet  - saw cardiology Tye Savoy) 04/09/20 - BNP 02/18/20 was 600.0  2: HTN- - BP looks good today - saw PCP Army Melia) 05/02/20 - BMP 05/02/20 reviewed and showed sodium 135, potassium 5.3, creatinine 1.57 and GFR 30  3: DM- - saw endocrinology Honor Junes) 10/16/2019 - A1c 02/20/20 was 6.6% - fasting glucose at home today was 108  - saw nephrology Candiss Norse) 03/27/20   Patient did not bring her medications nor a list. Each medication was verbally reviewed with the patient and she was encouraged to bring the bottles to every visit to confirm accuracy of list.  Due to HF stability, will not make a return appointment for patient at this time. Advised her and her daughter that they could call back at anytime to make another appointment.

## 2020-05-06 ENCOUNTER — Other Ambulatory Visit: Payer: Self-pay

## 2020-05-06 ENCOUNTER — Encounter: Payer: Self-pay | Admitting: Family

## 2020-05-06 ENCOUNTER — Ambulatory Visit: Payer: Medicare Other | Attending: Family | Admitting: Family

## 2020-05-06 VITALS — BP 144/36 | HR 63 | Resp 18 | Ht 60.0 in | Wt 114.1 lb

## 2020-05-06 DIAGNOSIS — E785 Hyperlipidemia, unspecified: Secondary | ICD-10-CM | POA: Insufficient documentation

## 2020-05-06 DIAGNOSIS — N184 Chronic kidney disease, stage 4 (severe): Secondary | ICD-10-CM

## 2020-05-06 DIAGNOSIS — E1136 Type 2 diabetes mellitus with diabetic cataract: Secondary | ICD-10-CM | POA: Insufficient documentation

## 2020-05-06 DIAGNOSIS — N189 Chronic kidney disease, unspecified: Secondary | ICD-10-CM | POA: Diagnosis not present

## 2020-05-06 DIAGNOSIS — J441 Chronic obstructive pulmonary disease with (acute) exacerbation: Secondary | ICD-10-CM | POA: Insufficient documentation

## 2020-05-06 DIAGNOSIS — R05 Cough: Secondary | ICD-10-CM | POA: Insufficient documentation

## 2020-05-06 DIAGNOSIS — E1122 Type 2 diabetes mellitus with diabetic chronic kidney disease: Secondary | ICD-10-CM | POA: Diagnosis not present

## 2020-05-06 DIAGNOSIS — I5032 Chronic diastolic (congestive) heart failure: Secondary | ICD-10-CM | POA: Diagnosis not present

## 2020-05-06 DIAGNOSIS — Z87891 Personal history of nicotine dependence: Secondary | ICD-10-CM | POA: Diagnosis not present

## 2020-05-06 DIAGNOSIS — I130 Hypertensive heart and chronic kidney disease with heart failure and stage 1 through stage 4 chronic kidney disease, or unspecified chronic kidney disease: Secondary | ICD-10-CM | POA: Insufficient documentation

## 2020-05-06 DIAGNOSIS — Z7901 Long term (current) use of anticoagulants: Secondary | ICD-10-CM | POA: Insufficient documentation

## 2020-05-06 DIAGNOSIS — R42 Dizziness and giddiness: Secondary | ICD-10-CM | POA: Insufficient documentation

## 2020-05-06 DIAGNOSIS — Z79899 Other long term (current) drug therapy: Secondary | ICD-10-CM | POA: Diagnosis not present

## 2020-05-06 DIAGNOSIS — R5383 Other fatigue: Secondary | ICD-10-CM | POA: Diagnosis not present

## 2020-05-06 DIAGNOSIS — I10 Essential (primary) hypertension: Secondary | ICD-10-CM

## 2020-05-06 DIAGNOSIS — E1159 Type 2 diabetes mellitus with other circulatory complications: Secondary | ICD-10-CM | POA: Insufficient documentation

## 2020-05-06 DIAGNOSIS — Z794 Long term (current) use of insulin: Secondary | ICD-10-CM | POA: Diagnosis not present

## 2020-05-06 DIAGNOSIS — Z8673 Personal history of transient ischemic attack (TIA), and cerebral infarction without residual deficits: Secondary | ICD-10-CM | POA: Insufficient documentation

## 2020-05-06 NOTE — Patient Instructions (Addendum)
Continue weighing daily and call for an overnight weight gain of > 2 pounds or a weekly weight gain of >5 pounds.   Call us in the future if you'd like to schedule another appointment 

## 2020-05-07 ENCOUNTER — Other Ambulatory Visit: Payer: Self-pay | Admitting: Internal Medicine

## 2020-05-07 DIAGNOSIS — R059 Cough, unspecified: Secondary | ICD-10-CM

## 2020-05-07 NOTE — Telephone Encounter (Signed)
Requested medication (s) are due for refill today -Rx written with no RF  Requested medication (s) are on the active medication list -yes  Future visit scheduled -yes  Last refill: 04/11/20  Notes to clinic: Rx written for acute problem with no RF- sent for PCP review of request  Requested Prescriptions  Pending Prescriptions Disp Refills   promethazine-dextromethorphan (PROMETHAZINE-DM) 6.25-15 MG/5ML syrup [Pharmacy Med Name: PROMETHAZINE DM SYRUP] 118 mL     Sig: TAKE 5 ML BY MOUTH FOUR TIMES DAILY AS NEEDED FOR COUGH      Ear, Nose, and Throat:  Antitussives/Expectorants Passed - 05/07/2020 11:16 AM      Passed - Valid encounter within last 12 months    Recent Outpatient Visits           5 days ago Essential hypertension   [facility] [name], MD   1 month ago Essential hypertension   [facility] [name], MD   4 months ago History of GI diverticular bleed   [facility] [name], MD   7 months ago Acute vaginitis   [facility] [name], MD   7 months ago Acute cystitis without hematuria   [facility] [name], MD       Future Appointments             In 10 months Army Melia Jesse Sans, MD [facility], [facility]                Requested Prescriptions  Pending Prescriptions Disp Refills   promethazine-dextromethorphan (PROMETHAZINE-DM) 6.25-15 MG/5ML syrup [Pharmacy Med Name: PROMETHAZINE DM SYRUP] 118 mL     Sig: TAKE 5 ML BY MOUTH FOUR TIMES DAILY AS NEEDED FOR COUGH      Ear, Nose, and Throat:  Antitussives/Expectorants Passed - 05/07/2020 11:16 AM      Passed - Valid encounter within last 12 months    Recent Outpatient Visits           5 days ago Essential hypertension   [place], Laura H, MD   1 month ago Essential hypertension   [facility] [name], MD   4 months ago History of GI diverticular bleed   [facility] [name], MD   7 months ago Acute vaginitis   [facility] [name], MD   7 months ago Acute cystitis without hematuria   [facility] [name], MD       Future Appointments             In 10 months Army Melia Jesse Sans, MD [facility], [facility]

## 2020-05-09 DIAGNOSIS — M5416 Radiculopathy, lumbar region: Secondary | ICD-10-CM | POA: Diagnosis not present

## 2020-05-09 DIAGNOSIS — M5126 Other intervertebral disc displacement, lumbar region: Secondary | ICD-10-CM | POA: Diagnosis not present

## 2020-05-17 DIAGNOSIS — E7800 Pure hypercholesterolemia, unspecified: Secondary | ICD-10-CM | POA: Diagnosis not present

## 2020-05-17 DIAGNOSIS — I5032 Chronic diastolic (congestive) heart failure: Secondary | ICD-10-CM | POA: Diagnosis not present

## 2020-05-17 DIAGNOSIS — I110 Hypertensive heart disease with heart failure: Secondary | ICD-10-CM | POA: Diagnosis not present

## 2020-05-17 DIAGNOSIS — Z5181 Encounter for therapeutic drug level monitoring: Secondary | ICD-10-CM | POA: Diagnosis not present

## 2020-05-17 DIAGNOSIS — E875 Hyperkalemia: Secondary | ICD-10-CM | POA: Diagnosis not present

## 2020-05-17 DIAGNOSIS — E870 Hyperosmolality and hypernatremia: Secondary | ICD-10-CM | POA: Diagnosis not present

## 2020-05-17 DIAGNOSIS — I2510 Atherosclerotic heart disease of native coronary artery without angina pectoris: Secondary | ICD-10-CM | POA: Diagnosis not present

## 2020-05-17 DIAGNOSIS — N1832 Chronic kidney disease, stage 3b: Secondary | ICD-10-CM | POA: Diagnosis not present

## 2020-05-20 DIAGNOSIS — E875 Hyperkalemia: Secondary | ICD-10-CM | POA: Diagnosis not present

## 2020-05-20 DIAGNOSIS — Z5181 Encounter for therapeutic drug level monitoring: Secondary | ICD-10-CM | POA: Diagnosis not present

## 2020-05-20 DIAGNOSIS — N1832 Chronic kidney disease, stage 3b: Secondary | ICD-10-CM | POA: Diagnosis not present

## 2020-05-20 DIAGNOSIS — E870 Hyperosmolality and hypernatremia: Secondary | ICD-10-CM | POA: Diagnosis not present

## 2020-05-30 ENCOUNTER — Ambulatory Visit: Payer: Self-pay

## 2020-05-30 ENCOUNTER — Other Ambulatory Visit: Payer: Self-pay

## 2020-05-30 ENCOUNTER — Ambulatory Visit (INDEPENDENT_AMBULATORY_CARE_PROVIDER_SITE_OTHER): Payer: Medicare Other

## 2020-05-30 ENCOUNTER — Ambulatory Visit (INDEPENDENT_AMBULATORY_CARE_PROVIDER_SITE_OTHER): Payer: Medicare Other | Admitting: Nurse Practitioner

## 2020-05-30 ENCOUNTER — Ambulatory Visit: Payer: Self-pay | Admitting: Internal Medicine

## 2020-05-30 ENCOUNTER — Encounter (INDEPENDENT_AMBULATORY_CARE_PROVIDER_SITE_OTHER): Payer: Self-pay | Admitting: Nurse Practitioner

## 2020-05-30 VITALS — BP 204/66 | HR 66 | Resp 16 | Wt 116.0 lb

## 2020-05-30 DIAGNOSIS — I6523 Occlusion and stenosis of bilateral carotid arteries: Secondary | ICD-10-CM

## 2020-05-30 DIAGNOSIS — I10 Essential (primary) hypertension: Secondary | ICD-10-CM | POA: Diagnosis not present

## 2020-05-30 DIAGNOSIS — E782 Mixed hyperlipidemia: Secondary | ICD-10-CM | POA: Diagnosis not present

## 2020-05-30 DIAGNOSIS — I70212 Atherosclerosis of native arteries of extremities with intermittent claudication, left leg: Secondary | ICD-10-CM | POA: Diagnosis not present

## 2020-05-30 NOTE — Telephone Encounter (Signed)
Pt's daughter calling pt present.States they did not go to UC, see previous triage.  Reporting additional BP values as follows:  187/60 once returned home from [place]. BS 145   30 minutes ago  151/60 standing 160/57 sitting  Few  minutes prior to call:  136/46 HR 73. States this is more in line with usual BP  Pt asymptomatic.  Assured NT would route to practice for Dr. Gaspar Cola review.

## 2020-05-30 NOTE — Telephone Encounter (Signed)
Patients daughter called stating that her mother has just had appointment at [facility].  There in office her BP was 200/60 . Per office daughter was instructed to contact PCP.  Next BP done at home was 187/60  With HR in the 70's per daughter. Patient has no symptoms.  Daughter states her mother has no complaints  Daughter states that cardiology has just restarted losartan 1/2 tab daily  Per daughter patient has not missed any medications. Per protocol daughter will take patient to UC today for evaluation. Dr Army Melia is not in office.  Care advice read to patient.  She verbalized understanding.  Reason for Disposition . [1] Systolic BP  >= 518 OR Diastolic >= 343  AND [7] having NO cardiac or neurologic symptoms  Answer Assessment - Initial Assessment Questions 1. BLOOD PRESSURE: "What is the blood pressure?" "Did you take at least two measurements 5 minutes apart?"     200/60 187/60  Hr 70's 2. ONSET: "When did you take your blood pressure?"     Today at [facility] 3. HOW: "How did you obtain the blood pressure?" (e.g., visiting nurse, automatic home BP monitor)    First BP at vascular office other home 4. HISTORY: "Do you have a history of high blood pressure?"    yes 5. MEDICATIONS: "Are you taking any medications for blood pressure?" "Have you missed any doses recently?"    no 6. OTHER SYMPTOMS: "Do you have any symptoms?" (e.g., headache, chest pain, blurred vision, difficulty breathing, weakness)     none 7. PREGNANCY: "Is there any chance you are pregnant?" "When was your last menstrual period?"    N/A  Protocols used: BLOOD PRESSURE - HIGH-A-AH

## 2020-05-31 NOTE — Telephone Encounter (Signed)
Noted  KP 

## 2020-05-31 NOTE — Telephone Encounter (Signed)
Please Call pt to schedule an appt. For BP. Explain to patient if BP is elavted this weekend that she needs to go to [facility] or ER.   Thank you,  KP

## 2020-05-31 NOTE — Telephone Encounter (Signed)
Called patient and she said that her BP went down and she doesn't need an appointment. She also mention she was seen by another doctor the other day which explained why her BP was high. She's feeling much better now.

## 2020-06-03 ENCOUNTER — Encounter (INDEPENDENT_AMBULATORY_CARE_PROVIDER_SITE_OTHER): Payer: Self-pay | Admitting: Nurse Practitioner

## 2020-06-03 DIAGNOSIS — M5126 Other intervertebral disc displacement, lumbar region: Secondary | ICD-10-CM | POA: Diagnosis not present

## 2020-06-03 DIAGNOSIS — M62830 Muscle spasm of back: Secondary | ICD-10-CM | POA: Diagnosis not present

## 2020-06-03 DIAGNOSIS — M5136 Other intervertebral disc degeneration, lumbar region: Secondary | ICD-10-CM | POA: Diagnosis not present

## 2020-06-03 DIAGNOSIS — M5416 Radiculopathy, lumbar region: Secondary | ICD-10-CM | POA: Diagnosis not present

## 2020-06-03 NOTE — Progress Notes (Signed)
Subjective:    Patient ID: Jill Hudson, female    DOB: [birth year], 85 y.o.   MRN: [id number] Chief Complaint  Patient presents with  . Follow-up    46month ultrasound follow up    The patient returns to the office for followup and review of the noninvasive studies. There have been no interval changes in lower extremity symptoms. No interval shortening of the patient's claudication distance or development of rest pain symptoms. No new ulcers or wounds have occurred since the last visit.  There have been no significant changes to the patient's overall health care.  The patient denies amaurosis fugax or recent TIA symptoms. There are no recent neurological changes noted. The patient denies history of DVT, PE or superficial thrombophlebitis. The patient denies recent episodes of angina or shortness of breath.    Bilateral ABIs are noncompressible TBI Rt=0.70 and Lt=0.71 (previous ABI's Rt=0.96 and Lt=0.77) Duplex ultrasound of the bilateral lower extremity reveals biphasic waveforms throughout however the left lower extremity does have noted 50 to 74% stenosis within the distal external iliac as well as the deep femoral artery   Review of Systems  Cardiovascular:       Some claudication  All other systems reviewed and are negative.      Objective:   Physical Exam Vitals reviewed.  HENT:     Head: Normocephalic.  Cardiovascular:     Rate and Rhythm: Normal rate.     Pulses: Normal pulses.  Neurological:     Mental Status: She is alert and oriented to person, place, and time.  Psychiatric:        Mood and Affect: Mood normal.        Behavior: Behavior normal.        Thought Content: Thought content normal.        Judgment: Judgment normal.     BP (!) 204/66 (BP Location: Left Arm)   Pulse 66   Resp 16   Wt 116 lb (52.6 kg)   BMI 22.65 kg/m   Past Medical History:  Diagnosis Date  . Acute CHF (congestive heart failure) ([place]) 02/18/2020  . Acute diastolic CHF  (congestive heart failure) ([place])   . Acute kidney injury superimposed on CKD ([place])   . Allergies   . Anxiety   . Arthritis    spine and shoulder  . Atherosclerosis of artery of extremity with rest pain ([place]) 10/12/2018  . Cancer ([place])    skin  . CHF (congestive heart failure) ([place])   . Diabetes mellitus without complication ([place])   . Heart murmur   . Hyperlipidemia   . Hypertension   . Macula lutea degeneration   . Mitral and aortic valve disease   . Myocardial infarction [facility])    may have had a "light" heart attack  . Non-ST elevation (NSTEMI) myocardial infarction ([place])   . Occasional tremors   . PAD (peripheral artery disease) ([place])   . Shingles    patient unaware but daughter confirms. it was a long time ago  . Stroke [facility]) 01/2017   may have had a slight stroke  . TIA (transient ischemic attack) 01/2017  . UTI (urinary tract infection)   . Vascular disease, peripheral ([place])     Social History   Socioeconomic History  . Marital status: Married    Spouse name: kermit  . Number of children: 3  . Years of education: Not on file  . Highest education level: 12th grade  Occupational History  .  Occupation: Retired    Comment: homemaker  Tobacco Use  . Smoking status: Former Smoker    Packs/day: 2.00    Years: 37.00    Pack years: 74.00    Types: Cigarettes    Quit date: 1980    Years since quitting: 41.6  . Smokeless tobacco: Never Used  . Tobacco comment: smoking cessation materials not required  Vaping Use  . Vaping Use: Never used  Substance and Sexual Activity  . Alcohol use: No    Alcohol/week: 0.0 standard drinks  . Drug use: No  . Sexual activity: Not Currently  Other Topics Concern  . Not on file  Social History Narrative   Husband has dementia and may wander. Requires constant supervision. Pt's daughter also lives with them. Her son Ollen Gross is a Garment/textile technologist.    Social Determinants of Health   Financial Resource Strain: Low Risk   . Difficulty of  Paying Living Expenses: Not hard at all  Food Insecurity: No Food Insecurity  . Worried About Charity fundraiser in the Last Year: Never true  . Ran Out of Food in the Last Year: Never true  Transportation Needs: No Transportation Needs  . Lack of Transportation (Medical): No  . Lack of Transportation (Non-Medical): No  Physical Activity: Insufficiently Active  . Days of Exercise per Week: 3 days  . Minutes of Exercise per Session: 20 min  Stress: No Stress Concern Present  . Feeling of Stress : Not at all  Social Connections: Socially Integrated  . Frequency of Communication with Friends and Family: More than three times a week  . Frequency of Social Gatherings with Friends and Family: More than three times a week  . Attends Religious Services: More than 4 times per year  . Active Member of Clubs or Organizations: Yes  . Attends Archivist Meetings: More than 4 times per year  . Marital Status: Married  Human resources officer Violence: Not At Risk  . Fear of Current or Ex-Partner: No  . Emotionally Abused: No  . Physically Abused: No  . Sexually Abused: No    Past Surgical History:  Procedure Laterality Date  . CARDIAC CATHETERIZATION  1998   40% LM, 95% Ramus interm  . CATARACT EXTRACTION, BILATERAL    . COLONOSCOPY WITH PROPOFOL N/A 08/12/2018   Procedure: COLONOSCOPY WITH PROPOFOL;  Surgeon: Lucilla Lame, MD;  Location: [facility];  Service: Endoscopy;  Laterality: N/A;  . ENDARTERECTOMY FEMORAL Left 10/12/2018   Procedure: ENDARTERECTOMY FEMORAL;  Surgeon: Katha Cabal, MD;  Location: [facility] ORS;  Service: Vascular;  Laterality: Left;  angioplasty and left SFA stent placement  . EYE SURGERY Bilateral    cataract extractions  . LOWER EXTREMITY ANGIOGRAPHY Left 08/23/2017   Procedure: Lower Extremity Angiography;  Surgeon: Algernon Huxley, MD;  Location: [place] CV LAB;  Service: Cardiovascular;  Laterality: Left;  . LOWER EXTREMITY ANGIOGRAPHY Left 07/26/2018    Procedure: LOWER EXTREMITY ANGIOGRAPHY;  Surgeon: Katha Cabal, MD;  Location: [place] CV LAB;  Service: Cardiovascular;  Laterality: Left;  . LOWER EXTREMITY ANGIOGRAPHY Left 09/16/2018   Procedure: LOWER EXTREMITY ANGIOGRAPHY;  Surgeon: Katha Cabal, MD;  Location: [place] CV LAB;  Service: Cardiovascular;  Laterality: Left;  . PTCA  08/2013   Left common iliac  . PTCA  12/2012   left ext iliac  . RIGHT HEART CATH N/A 02/23/2020   Procedure: RIGHT HEART CATH;  Surgeon: Minna Merritts, MD;  Location: [place] CV LAB;  Service: Cardiovascular;  Laterality: N/A;  . TUBAL LIGATION      Family History  Problem Relation Age of Onset  . Dementia Mother   . Diabetes Father     Allergies  Allergen Reactions  . Saxagliptin Diarrhea  . Epinephrine Other (See Comments)    Patient does not remember what happens when she uses this  . Atorvastatin Other (See Comments)    Muscle aches  . Codeine Other (See Comments)    Upset stomach  . Ezetimibe Other (See Comments)    Myalgias(ZETIA)  . Limonene Rash    Patient does not recall this reaction  . Nitrofurantoin Rash and Other (See Comments)    Pruitus  . Sulfa Antibiotics Rash and Other (See Comments)    Sore mouth        Assessment & Plan:   1. Atherosclerosis of native artery of left lower extremity with intermittent claudication (HCC)  Recommend:  The patient has evidence of atherosclerosis of the lower extremities with claudication.  The patient does not voice lifestyle limiting changes at this point in time.  Noninvasive studies do not suggest clinically significant change.  No invasive studies, angiography or surgery at this time The patient should continue walking and begin a more formal exercise program.  The patient should continue antiplatelet therapy and aggressive treatment of the lipid abnormalities  No changes in the patient's medications at this time  The patient should continue  wearing graduated compression socks 10-15 mmHg strength to control the mild edema.   Patient return in 6 months with noninvasive studies. 2. Essential hypertension Continue antihypertensive medications as already ordered, these medications have been reviewed and there are no changes at this time.   3. Mixed hyperlipidemia Continue statin as ordered and reviewed, no changes at this time    Current Outpatient Medications on File Prior to Visit  Medication Sig Dispense Refill  . acetaminophen (TYLENOL) 650 MG CR tablet Take 1,300 mg by mouth every 8 (eight) hours as needed for pain.    Marland Kitchen albuterol (VENTOLIN HFA) 108 (90 Base) MCG/ACT inhaler Inhale 2 puffs into the lungs every 6 (six) hours as needed for wheezing or shortness of breath. 18 g 0  . apixaban (ELIQUIS) 2.5 MG TABS tablet Take 1 tablet (2.5 mg total) by mouth 2 (two) times daily. 60 tablet 5  . aspirin 81 MG chewable tablet Chew by mouth.    . B-D ULTRAFINE III SHORT PEN 31G X 8 MM MISC USE AS DIRECTED 100 each 3  . Cyanocobalamin (RA VITAMIN B-12 TR) 1000 MCG TBCR Take 1,000 mcg by mouth daily.     . feeding supplement, GLUCERNA SHAKE, (GLUCERNA SHAKE) LIQD Take 237 mLs by mouth 3 (three) times daily between meals. 21330 mL 0  . ferrous sulfate 325 (65 FE) MG tablet Take 325 mg by mouth daily with breakfast.    . gabapentin (NEURONTIN) 100 MG capsule Take 1 capsule (100 mg total) by mouth at bedtime. (Patient taking differently: Take 100 mg by mouth 3 (three) times daily. )    . insulin aspart (NOVOLOG FLEXPEN) 100 UNIT/ML FlexPen Inject 3 Units into the skin 3 (three) times daily with meals. At lunch (Patient taking differently: Inject 20 Units into the skin daily. At breakfast- sliding scale) 15 mL 3  . Insulin Glargine (BASAGLAR KWIKPEN) 100 UNIT/ML SOPN Inject 0.12 mLs (12 Units total) into the skin daily. 15 mL 3  . melatonin 5 MG TABS Take 0.5 tablets (2.5 mg total) by mouth  at bedtime as needed (sleep). 30 tablet 0  .  metoprolol tartrate (LOPRESSOR) 25 MG tablet Take 1 tablet (25 mg total) by mouth 2 (two) times daily. 60 tablet 0  . Multiple Vitamins-Minerals (PRESERVISION AREDS PO) Take 1 capsule by mouth 2 (two) times daily.     . pantoprazole (PROTONIX) 40 MG tablet Take by mouth.    . polyethylene glycol powder (GLYCOLAX/MIRALAX) 17 GM/SCOOP powder Take by mouth.    . promethazine-dextromethorphan (PROMETHAZINE-DM) 6.25-15 MG/5ML syrup Take 5 mLs by mouth 4 (four) times daily as needed for cough. 180 mL 0  . rosuvastatin (CRESTOR) 5 MG tablet Take 5 mg by mouth every other day. Mon, Wed, Fri and Sat.    . torsemide (DEMADEX) 20 MG tablet Take 2 tablets (40 mg total) by mouth daily. 30 tablet 0  . Tiotropium Bromide-Olodaterol 2.5-2.5 MCG/ACT AERS Inhale 2 puffs into the lungs daily. (Patient not taking: Reported on 05/06/2020) 4 g 0   No current facility-administered medications on file prior to visit.    There are no Patient Instructions on file for this visit. No follow-ups on file.   Kris Hartmann, NP

## 2020-06-04 ENCOUNTER — Other Ambulatory Visit: Payer: Self-pay | Admitting: Internal Medicine

## 2020-06-04 DIAGNOSIS — R059 Cough, unspecified: Secondary | ICD-10-CM

## 2020-06-04 NOTE — Telephone Encounter (Signed)
Requested medication (s) are due for refill today - Rx written with no RF  Requested medication (s) are on the active medication list -yes  Future visit scheduled -yes  Last refill: 05/02/20  Notes to clinic: Notes recorded to only use at night- request for RF  Requested Prescriptions  Pending Prescriptions Disp Refills   promethazine-dextromethorphan (PROMETHAZINE-DM) 6.25-15 MG/5ML syrup [Pharmacy Med Name: PROMETHAZINE DM SYRUP] 180 mL 0    Sig: TAKE 5 ML BY MOUTH FOUR TIMES DAILY AS NEEDED FOR COUGH      Ear, Nose, and Throat:  Antitussives/Expectorants Passed - 06/04/2020  3:58 PM      Passed - Valid encounter within last 12 months    Recent Outpatient Visits           1 month ago Essential hypertension   [facility] [name], MD   2 months ago Essential hypertension   [facility] [name], MD   4 months ago History of GI diverticular bleed   [facility] [name], MD   8 months ago Acute vaginitis   [facility] [name], MD   8 months ago Acute cystitis without hematuria   [facility] [name], MD       Future Appointments             In 9 months Army Melia Jesse Sans, MD [facility], [facility]                Requested Prescriptions  Pending Prescriptions Disp Refills   promethazine-dextromethorphan (PROMETHAZINE-DM) 6.25-15 MG/5ML syrup [Pharmacy Med Name: PROMETHAZINE DM SYRUP] 180 mL 0    Sig: TAKE 5 ML BY MOUTH FOUR TIMES DAILY AS NEEDED FOR COUGH      Ear, Nose, and Throat:  Antitussives/Expectorants Passed - 06/04/2020  3:58 PM      Passed - Valid encounter within last 12 months    Recent Outpatient Visits           1 month ago Essential hypertension   [place], Laura H, MD   2 months ago Essential hypertension   [place], Laura H, MD   4 months ago History of GI diverticular bleed   [facility]  [name], MD   8 months ago Acute vaginitis   [facility] [name], MD   8 months ago Acute cystitis without hematuria   [facility] [name], MD       Future Appointments             In 9 months Army Melia Jesse Sans, MD [facility], [facility]

## 2020-06-06 ENCOUNTER — Telehealth: Payer: Self-pay | Admitting: *Deleted

## 2020-06-06 DIAGNOSIS — I10 Essential (primary) hypertension: Secondary | ICD-10-CM | POA: Diagnosis not present

## 2020-06-06 DIAGNOSIS — Z5181 Encounter for therapeutic drug level monitoring: Secondary | ICD-10-CM | POA: Diagnosis not present

## 2020-06-06 DIAGNOSIS — E1169 Type 2 diabetes mellitus with other specified complication: Secondary | ICD-10-CM | POA: Diagnosis not present

## 2020-06-06 DIAGNOSIS — N1832 Chronic kidney disease, stage 3b: Secondary | ICD-10-CM | POA: Diagnosis not present

## 2020-06-06 NOTE — Chronic Care Management (AMB) (Signed)
  Chronic Care Management   Note  06/06/2020 Name: PANTERA WINTERROWD MRN: [id number] DOB: 1935/04/02  KORIN SETZLER is a 85 y.o. year old female who is a primary care patient of [name], MD and is actively engaged with the care management team. I reached out to [name] by phone today to assist with scheduling an initial visit with the Pharmacist.  Follow up plan: Telephone appointment with care management team member scheduled for: 06/13/2020  [place], [place], [place] [address] Direct Dial: [email or web address]

## 2020-06-07 ENCOUNTER — Other Ambulatory Visit: Payer: Self-pay | Admitting: Internal Medicine

## 2020-06-07 DIAGNOSIS — R059 Cough, unspecified: Secondary | ICD-10-CM

## 2020-06-07 DIAGNOSIS — R05 Cough: Secondary | ICD-10-CM

## 2020-06-07 NOTE — Telephone Encounter (Signed)
Medication Refill - Medication:  promethazine-dextromethorphan (PROMETHAZINE-DM) 6.25-15 MG/5ML syrup   Has the patient contacted their pharmacy? Yes.   (Agent: If no, request that the patient contact the pharmacy for the refill.) (Agent: If yes, when and what did the pharmacy advise?)  Preferred Pharmacy (with phone number or street name):  [facility] [place], [place] [address]  [place] [address]  Phone: [phone] Fax: [phone]     Agent: Please be advised that RX refills may take up to 3 business days. We ask that you follow-up with your pharmacy.

## 2020-06-07 NOTE — Telephone Encounter (Signed)
° °  Notes to clinic:  looks like medication has already been refused for refill Please advise    Requested Prescriptions  Pending Prescriptions Disp Refills   promethazine-dextromethorphan (PROMETHAZINE-DM) 6.25-15 MG/5ML syrup 180 mL 0    Sig: Take 5 mLs by mouth 4 (four) times daily as needed for cough.      Ear, Nose, and Throat:  Antitussives/Expectorants Passed - 06/07/2020  2:05 PM      Passed - Valid encounter within last 12 months    Recent Outpatient Visits           1 month ago Essential hypertension   [facility] [name], MD   2 months ago Essential hypertension   [facility] [name], MD   5 months ago History of GI diverticular bleed   [facility] [name], MD   8 months ago Acute vaginitis   [facility] [name], MD   8 months ago Acute cystitis without hematuria   [facility] [name], MD       Future Appointments             In 9 months Army Melia Jesse Sans, MD [facility], [facility]

## 2020-06-07 NOTE — Telephone Encounter (Signed)
Please Advise. Last office visit 05/02/2020.  KP

## 2020-06-13 ENCOUNTER — Ambulatory Visit: Payer: Medicare Other

## 2020-06-13 DIAGNOSIS — N1831 Chronic kidney disease, stage 3a: Secondary | ICD-10-CM

## 2020-06-13 DIAGNOSIS — I10 Essential (primary) hypertension: Secondary | ICD-10-CM

## 2020-06-13 NOTE — Chronic Care Management (AMB) (Signed)
Chronic Care Management Pharmacy  Name: Jill Hudson  MRN: [id number] DOB: 12-28-1934   Chief Complaint/ HPI  Jill Hudson,  85 y.o. , female presents for their Initial CCM visit with the clinical pharmacist via telephone due to COVID-19 Pandemic.  PCP : [name], MD Patient Care Team: [name], MD as PCP - General (Internal Medicine) [name], Dolores Lory, MD as Consulting Physician (Vascular Surgery) [name], MD as Consulting Physician (Endocrinology) [name], DPM as Consulting Physician (Podiatry) Corey Skains, MD as Consulting Physician (Cardiology) Jannet Mantis, MD as Consulting Physician (Dermatology) Deetta Perla, MD as Consulting Physician (Neurosurgery) [name], RN as Case Manager (General Practice) Vladimir Faster, [facility] (Pharmacist)  Their chronic conditions include: Hypertension, Hyperlipidemia, Diabetes, Atrial Fibrillation, Heart Failure, Coronary Artery Disease, GERD, COPD and Chronic Kidney Disease   Office Visits: 05/02/20 - Dr. Army Melia - labs, BP recheck-prometh/codeine for hs cough   Consult Visit: 06/07/20 -RN- labs/meds follow up 05/30/20- [name], NP- [place] no intervention compression stocking 10-15 mmhg 05/17/20- [name], PA- cardiology - labs, restart losartan 12.5 mg 03/27/20 - Dr. Candiss Norse, Nephrology - No changes, bloodwork 02/23/20- Hospitalized- CHF, ARF, PNA  Allergies  Allergen Reactions  . Saxagliptin Diarrhea  . Epinephrine Other (See Comments)    Patient does not remember what happens when she uses this  . Atorvastatin Other (See Comments)    Muscle aches  . Codeine Other (See Comments)    Upset stomach  . Ezetimibe Other (See Comments)    Myalgias(ZETIA)  . Limonene Rash    Patient does not recall this reaction  . Nitrofurantoin Rash and Other (See Comments)    Pruitus  . Sulfa Antibiotics Rash and Other (See Comments)    Sore mouth     Medications: Outpatient  Encounter Medications as of 06/13/2020  Medication Sig Note  . acetaminophen (TYLENOL) 650 MG CR tablet Take 1,300 mg by mouth every 8 (eight) hours as needed for pain.   Marland Kitchen albuterol (VENTOLIN HFA) 108 (90 Base) MCG/ACT inhaler Inhale 2 puffs into the lungs every 6 (six) hours as needed for wheezing or shortness of breath.   Marland Kitchen apixaban (ELIQUIS) 2.5 MG TABS tablet Take 1 tablet (2.5 mg total) by mouth 2 (two) times daily.   Marland Kitchen aspirin 81 MG chewable tablet Chew by mouth.   . B-D ULTRAFINE III SHORT PEN 31G X 8 MM MISC USE AS DIRECTED   . Cyanocobalamin (RA VITAMIN B-12 TR) 1000 MCG TBCR Take 1,000 mcg by mouth daily.    . feeding supplement, GLUCERNA SHAKE, (GLUCERNA SHAKE) LIQD Take 237 mLs by mouth 3 (three) times daily between meals.   . ferrous sulfate 325 (65 FE) MG tablet Take 325 mg by mouth daily with breakfast.   . gabapentin (NEURONTIN) 100 MG capsule Take 1 capsule (100 mg total) by mouth at bedtime. (Patient taking differently: Take 100 mg by mouth 3 (three) times daily. )   . insulin aspart (NOVOLOG FLEXPEN) 100 UNIT/ML FlexPen Inject 3 Units into the skin 3 (three) times daily with meals. At lunch (Patient taking differently: Inject 20 Units into the skin daily. At breakfast- sliding scale) 02/28/2020: 3 units TIDAC  . Insulin Glargine (BASAGLAR KWIKPEN) 100 UNIT/ML SOPN Inject 0.12 mLs (12 Units total) into the skin daily.   . melatonin 5 MG TABS Take 0.5 tablets (2.5 mg total) by mouth at bedtime as needed (sleep).   . metoprolol tartrate (LOPRESSOR) 25 MG tablet  Take 1 tablet (25 mg total) by mouth 2 (two) times daily.   . Multiple Vitamins-Minerals (PRESERVISION AREDS PO) Take 1 capsule by mouth 2 (two) times daily.    . pantoprazole (PROTONIX) 40 MG tablet Take by mouth.   . polyethylene glycol powder (GLYCOLAX/MIRALAX) 17 GM/SCOOP powder Take by mouth.   . promethazine-dextromethorphan (PROMETHAZINE-DM) 6.25-15 MG/5ML syrup Take 5 mLs by mouth 4 (four) times daily as needed for  cough.   . rosuvastatin (CRESTOR) 5 MG tablet Take 5 mg by mouth every other day. Mon, Wed, Fri and Sat.   . Tiotropium Bromide-Olodaterol 2.5-2.5 MCG/ACT AERS Inhale 2 puffs into the lungs daily. (Patient not taking: Reported on 05/06/2020) 02/28/2020: Has not been able to fill yet  . torsemide (DEMADEX) 20 MG tablet Take 2 tablets (40 mg total) by mouth daily.    No facility-administered encounter medications on file as of 06/13/2020.     Current Diagnosis/Assessment:    Goals Addressed            This Visit's Progress   . PharmD         Diabetes   A1c goal <7%  Recent Relevant Labs: Lab Results  Component Value Date/Time   HGBA1C 6.6 (H) 02/20/2020 04:11 AM   HGBA1C 6.3 (H) 02/18/2020 07:58 AM   HGBA1C 6.5 10/16/2019 12:00 AM   HGBA1C 6.3 06/12/2019 12:00 AM    Last diabetic Eye exam:  Lab Results  Component Value Date/Time   HMDIABEYEEXA Retinopathy (A) 09/06/2019 12:00 AM    Last diabetic Foot exam: No results found for: HMDIABFOOTEX   Checking BG: 3-4 times daily  Recent  Readings: 146 immediately after breakfast   402 today after lunch (had not taken basaglar or novolog at time of check) Recent HS BG readings: 311 last night after supper ( had not taken novolog)  Patient has failed these meds in past: [place] Patient is currently controlled on the following medications: . Gabapentin 121m at bedtime . Novolog 2 units three- four times daily (states she only takes 4th dose if sugars don't come down) . basaglar 12 units after lunch  We discussed: Has appointment with endocrinology tomorrow. Cancelled 4/26 due to hospitalization.  Had injection for back pain on 06/03/20 containing Kenalog. This could be contributing to  increased sugars. She has been taking her Novolog about an hour after meals. Counseled that this should be taken at the start of meals and not to take if she does not eat. She voiced understanding. Discussed the importance of checking  fasting and 2 hr post-prandials to help guide therapy changes. Reviewed symptoms of hypoglycemia. Patient states she has previously experienced shakiness and hunger with readings around 100. Has not occurred in previous month. She endorses decreased appetite since loss of taste from anesthesia during last procedure.   Plan  Continue current medications.Recommend patient take Novolog at the start of meals, monitor fasting and 2 hr post-prandials to help guide therapy. Consider changing Basaglar administration time to every morning as the effects can start wean after ~12 hours.    Hypertension/HFpEF/CVA    Labs 06/06/20 Scr 1.76, BUN 43 eGFR 26, K4.6-- k had been 5.5 05/17/20 cardiology following closely BP goal is:  <130/80  Office blood pressures are  BP Readings from Last 3 Encounters:  05/30/20 (!) 204/66  05/06/20 (!) 144/36  05/02/20 (!) 132/40   Patient checks BP at home twice daily Patient home BP readings are ranging: 141/61, 136/51,   Patient has failed these meds in  the past: Patient is currently controlled on the following medications:  Marland Kitchen Metoprolol 25 mg . Losartan 12.5 mg daily . Torsemide 20 mg bid  We discussed patient's daughter takes care of her medications and fills her pill box. She is not certain what time she takes 2nd dose of torsemide. Daughter has been logging BP standing and seated and calling to Cardiology. Patient has 30 point drop in SBP from sitting to standing at nephrology appt.  Patient is weighing daily and daughter records.   Plan  Continue current medications     Hyperlipidemia   LDL goal < 70  Lipid Panel     Component Value Date/Time   CHOL 117 02/19/2020 0506   CHOL 139 12/26/2018 1612   TRIG 34 02/19/2020 0506   HDL 56 02/19/2020 0506   HDL 47 12/26/2018 1612   LDLCALC 54 02/19/2020 0506   LDLCALC 57 12/26/2018 1612    Hepatic Function Latest Ref Rng & Units 02/21/2020 12/26/2019 12/25/2019  Total Protein 6.5 - 8.1 g/dL 6.1(L) 5.2(L)  6.0(L)  Albumin 3.5 - 5.0 g/dL 3.5 3.0(L) 3.5  AST 15 - 41 U/L 33 11(L) 18  ALT 0 - 44 U/L 50(H) 11 14  Alk Phosphatase 38 - 126 U/L 78 55 63  Total Bilirubin 0.3 - 1.2 mg/dL 0.7 0.8 0.7  Bilirubin, Direct 0.0 - 0.2 mg/dL <0.1 - -     The ASCVD Risk score (Crawford., et al., 2013) failed to calculate for the following reasons:   The 2013 ASCVD risk score is only valid for ages 60 to 62   The patient has a prior MI or stroke diagnosis   Patient has failed these meds in past: atorvastatin, ezetimibe Patient is currently controlled on the following medications:  . Rosuvastatin 5 mg qod  We discussed:  Patient is tolerating well. Discussed importance of continued therapy given ASCVD history and DM.  Plan  Continue current medications  AFIB   Patient is currently rate controlled. Office heart rates are  Pulse Readings from Last 3 Encounters:  05/30/20 66  05/06/20 63  05/02/20 66    CHA2DS2-VASc Score = 7  The patient's score is based upon: CHF History: 1 HTN History: 1 Age : 2 Diabetes History: 1 Stroke History: 0 Vascular Disease History: 1 Gender: 1  {  ASSESSMENT AND PLAN: Paroxysmal Atrial Fibrillation (ICD10:  I48.0) The patient's CHA2DS2-VASc score is 7, indicating a 11.2% annual risk of stroke.   The patient is at significant risk for stroke/thromboembolism based upon her CHA2DS2-VASc Score of 7.  Continue Apixaban (Eliquis).    Patient has failed these meds in past: None   We discussed:  Signs/symptoms of bleeding.   Plan  Continue current medications     COPD   Last Spirometry: Unavailable  Patient is currently controlled on the following medications:  . Tiotropium/olodaterol 2.5-2.51mg/act 2 puffs every evening at bedtime. . Albuterol 2 puffs q6h prn  We discussed:  Last used rescue inhaler last week.   Plan  Continue current medications        Medication Management   Pt uses WBicknellfor all medications Uses pill  box? Yes. Daughter fills for her. Pt endorses compliance    Plan  Continue current medication management strategy    Follow up: 2  month phone visit  JJunita Push HKenton KingfisherPharmD, BEast [phone]

## 2020-06-13 NOTE — Patient Instructions (Addendum)
Visit Information Jill Hudson,  It was a pleasure speaking with you today! Thank you for letting me be a part of your care team. Please call with any questions or concerns.   Keep being awesome! Jill Hudson, PharmD  Goals Addressed            This Visit's Progress   . PharmD "I want to be healthy as I can"       CARE PLAN ENTRY (see longitudinal plan of care for additional care plan information)  Current Barriers:  . 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 Daughter lives in the home and is very involved in managing health care needs. Patient no longer drives. . Polypharmacy; complex patient with multiple comorbidities including Diabetes, hypertension, CKD, CAD,PVD, HF, GERD, Hyperlipidemia . Daughter, Jill Hudson, manages medications by pill box. . Most recent eGFR: 38m/min .   Pharmacist Clinical Goal(s):  .Marland KitchenOver the next60 days, patient will work with PharmD and provider towards optimized medication management  Interventions: . Comprehensive medication review performed; medication list updated in electronic medical record . Inter-disciplinary care team collaboration (see longitudinal plan of care) . Counseled patient to take Novolog with meals instead of 1 hour after. Discussed diet and exercise.   Patient Self Care Activities:  . Patient will take medications as prescribed . Patient will focus on improved adherence by taking Novolog with meals. . Continue to monitor blood glucose 3 - 4 times daily. Take in the morning before eating (fasting) and ~ 2 hours after meals. . Continue to monitor BP twice daily.  Initial goal documentation        [name] given information about Chronic Care Management services today including:  1. CCM service includes personalized support from designated clinical staff supervised by her physician, including individualized plan of care and coordination with other care providers 2. 24/7 contact  phone numbers for assistance for urgent and routine care needs. 3. Standard insurance, coinsurance, copays and deductibles apply for chronic care management only during months in which we provide at least 20 minutes of these services. Most insurances cover these services at 100%, however patients may be responsible for any copay, coinsurance and/or deductible if applicable. This service may help you avoid the need for more expensive face-to-face services. 4. Only one practitioner may furnish and bill the service in a calendar month. 5. The patient may stop CCM services at any time (effective at the end of the month) by phone call to the office staff.  Patient agreed to services and verbal consent obtained.   The patient verbalized understanding of instructions provided today and agreed to receive a mailed copy of patient instruction and/or educational materials. Telephone follow up appointment with pharmacy team member scheduled for: 08/08/20 10:30 AM  JJunita Push Kanya Potteiger PharmD, BCPS Clinical Pharmacist [phone] Diabetes Mellitus and Nutrition, Adult When you have diabetes (diabetes mellitus), it is very important to have healthy eating habits because your blood sugar (glucose) levels are greatly affected by what you eat and drink. Eating healthy foods in the appropriate amounts, at about the same times every day, can help you:  Control your blood glucose.  Lower your risk of heart disease.  Improve your blood pressure.  Reach or maintain a healthy weight. Every person with diabetes is different, and each person has different needs for a meal plan. Your health care provider may recommend that you work with a diet and nutrition specialist (dietitian) to make a meal plan  that is best for you. Your meal plan may vary depending on factors such as:  The calories you need.  The medicines you take.  Your weight.  Your blood glucose, blood pressure, and cholesterol levels.  Your activity  level.  Other health conditions you have, such as heart or kidney disease. How do carbohydrates affect me? Carbohydrates, also called carbs, affect your blood glucose level more than any other type of food. Eating carbs naturally raises the amount of glucose in your blood. Carb counting is a method for keeping track of how many carbs you eat. Counting carbs is important to keep your blood glucose at a healthy level, especially if you use insulin or take certain oral diabetes medicines. It is important to know how many carbs you can safely have in each meal. This is different for every person. Your dietitian can help you calculate how many carbs you should have at each meal and for each snack. Foods that contain carbs include:  Bread, cereal, rice, pasta, and crackers.  Potatoes and corn.  Peas, beans, and lentils.  Milk and yogurt.  Fruit and juice.  Desserts, such as cakes, cookies, ice cream, and candy. How does alcohol affect me? Alcohol can cause a sudden decrease in blood glucose (hypoglycemia), especially if you use insulin or take certain oral diabetes medicines. Hypoglycemia can be a life-threatening condition. Symptoms of hypoglycemia (sleepiness, dizziness, and confusion) are similar to symptoms of having too much alcohol. If your health care provider says that alcohol is safe for you, follow these guidelines:  Limit alcohol intake to no more than 1 drink per day for nonpregnant women and 2 drinks per day for men. One drink equals 12 oz of beer, 5 oz of wine, or 1 oz of hard liquor.  Do not drink on an empty stomach.  Keep yourself hydrated with water, diet soda, or unsweetened iced tea.  Keep in mind that regular soda, juice, and other mixers may contain a lot of sugar and must be counted as carbs. What are tips for following this plan?  Reading food labels  Start by checking the serving size on the "Nutrition Facts" label of packaged foods and drinks. The amount of  calories, carbs, fats, and other nutrients listed on the label is based on one serving of the item. Many items contain more than one serving per package.  Check the total grams (g) of carbs in one serving. You can calculate the number of servings of carbs in one serving by dividing the total carbs by 15. For example, if a food has 30 g of total carbs, it would be equal to 2 servings of carbs.  Check the number of grams (g) of saturated and trans fats in one serving. Choose foods that have low or no amount of these fats.  Check the number of milligrams (mg) of salt (sodium) in one serving. Most people should limit total sodium intake to less than 2,300 mg per day.  Always check the nutrition information of foods labeled as "low-fat" or "nonfat". These foods may be higher in added sugar or refined carbs and should be avoided.  Talk to your dietitian to identify your daily goals for nutrients listed on the label. Shopping  Avoid buying canned, premade, or processed foods. These foods tend to be high in fat, sodium, and added sugar.  Shop around the outside edge of the grocery store. This includes fresh fruits and vegetables, bulk grains, fresh meats, and fresh dairy. Cooking  Use low-heat cooking methods, such as baking, instead of high-heat cooking methods like deep frying.  Cook using healthy oils, such as olive, canola, or sunflower oil.  Avoid cooking with butter, cream, or high-fat meats. Meal planning  Eat meals and snacks regularly, preferably at the same times every day. Avoid going long periods of time without eating.  Eat foods high in fiber, such as fresh fruits, vegetables, beans, and whole grains. Talk to your dietitian about how many servings of carbs you can eat at each meal.  Eat 4-6 ounces (oz) of lean protein each day, such as lean meat, chicken, fish, eggs, or tofu. One oz of lean protein is equal to: ? 1 oz of meat, chicken, or fish. ? 1 egg. ?  cup of tofu.  Eat  some foods each day that contain healthy fats, such as avocado, nuts, seeds, and fish. Lifestyle  Check your blood glucose regularly.  Exercise regularly as told by your health care provider. This may include: ? 150 minutes of moderate-intensity or vigorous-intensity exercise each week. This could be brisk walking, biking, or water aerobics. ? Stretching and doing strength exercises, such as yoga or weightlifting, at least 2 times a week.  Take medicines as told by your health care provider.  Do not use any products that contain nicotine or tobacco, such as cigarettes and e-cigarettes. If you need help quitting, ask your health care provider.  Work with a Social worker or diabetes educator to identify strategies to manage stress and any emotional and social challenges. Questions to ask a health care provider  Do I need to meet with a diabetes educator?  Do I need to meet with a dietitian?  What number can I call if I have questions?  When are the best times to check my blood glucose? Where to find more information:  American Diabetes Association: diabetes.org  Academy of Nutrition and Dietetics: www.eatright.[employer] of Diabetes and Digestive and Kidney Diseases (NIH): [employer] Summary  A healthy meal plan will help you control your blood glucose and maintain a healthy lifestyle.  Working with a diet and nutrition specialist (dietitian) can help you make a meal plan that is best for you.  Keep in mind that carbohydrates (carbs) and alcohol have immediate effects on your blood glucose levels. It is important to count carbs and to use alcohol carefully. This information is not intended to replace advice given to you by your health care provider. Make sure you discuss any questions you have with your health care provider. Document Revised: 09/24/2017 Document Reviewed: 11/16/2016 Elsevier Patient Education  2020 [employer].

## 2020-06-14 DIAGNOSIS — Z794 Long term (current) use of insulin: Secondary | ICD-10-CM | POA: Diagnosis not present

## 2020-06-14 DIAGNOSIS — E785 Hyperlipidemia, unspecified: Secondary | ICD-10-CM | POA: Diagnosis not present

## 2020-06-14 DIAGNOSIS — E1159 Type 2 diabetes mellitus with other circulatory complications: Secondary | ICD-10-CM | POA: Diagnosis not present

## 2020-06-14 DIAGNOSIS — E1142 Type 2 diabetes mellitus with diabetic polyneuropathy: Secondary | ICD-10-CM | POA: Diagnosis not present

## 2020-06-14 DIAGNOSIS — I152 Hypertension secondary to endocrine disorders: Secondary | ICD-10-CM | POA: Diagnosis not present

## 2020-06-14 DIAGNOSIS — E1169 Type 2 diabetes mellitus with other specified complication: Secondary | ICD-10-CM | POA: Diagnosis not present

## 2020-06-14 LAB — HEMOGLOBIN A1C: Hemoglobin A1C: 8.2

## 2020-06-17 ENCOUNTER — Telehealth: Payer: Self-pay | Admitting: Internal Medicine

## 2020-06-17 NOTE — Telephone Encounter (Signed)
Copied from [place] [id number]. Topic: General - Inquiry >> Jun 17, 2020  9:59 AM Mathis Bud wrote: Reason for CRM: Patient daughter called stating patient is having leg cramps during the night and now early in the morning.  Patient is requesting to speak with nurse regarding. Call back [phone]  [phone]

## 2020-06-17 NOTE — Telephone Encounter (Signed)
Called pts daughter Judson Roch told her that pt can try to take magnesium and keep her legs elevated.  If she continues to have this problem to gibe [place] a call. Pts daughter verbalized understanding.  KP

## 2020-06-20 DIAGNOSIS — D485 Neoplasm of uncertain behavior of skin: Secondary | ICD-10-CM | POA: Diagnosis not present

## 2020-06-20 DIAGNOSIS — C44629 Squamous cell carcinoma of skin of left upper limb, including shoulder: Secondary | ICD-10-CM | POA: Diagnosis not present

## 2020-06-21 ENCOUNTER — Other Ambulatory Visit (INDEPENDENT_AMBULATORY_CARE_PROVIDER_SITE_OTHER): Payer: Medicare Other | Admitting: Internal Medicine

## 2020-06-21 DIAGNOSIS — E782 Mixed hyperlipidemia: Secondary | ICD-10-CM

## 2020-06-21 DIAGNOSIS — I2510 Atherosclerotic heart disease of native coronary artery without angina pectoris: Secondary | ICD-10-CM

## 2020-06-21 DIAGNOSIS — F419 Anxiety disorder, unspecified: Secondary | ICD-10-CM

## 2020-06-21 DIAGNOSIS — E871 Hypo-osmolality and hyponatremia: Secondary | ICD-10-CM

## 2020-06-21 DIAGNOSIS — R251 Tremor, unspecified: Secondary | ICD-10-CM

## 2020-06-21 DIAGNOSIS — N184 Chronic kidney disease, stage 4 (severe): Secondary | ICD-10-CM

## 2020-06-21 DIAGNOSIS — N1831 Chronic kidney disease, stage 3a: Secondary | ICD-10-CM

## 2020-06-21 DIAGNOSIS — H3530 Unspecified macular degeneration: Secondary | ICD-10-CM

## 2020-06-21 DIAGNOSIS — M19012 Primary osteoarthritis, left shoulder: Secondary | ICD-10-CM

## 2020-06-21 DIAGNOSIS — R490 Dysphonia: Secondary | ICD-10-CM

## 2020-06-21 DIAGNOSIS — I214 Non-ST elevation (NSTEMI) myocardial infarction: Secondary | ICD-10-CM

## 2020-06-21 DIAGNOSIS — K219 Gastro-esophageal reflux disease without esophagitis: Secondary | ICD-10-CM

## 2020-06-21 DIAGNOSIS — M19011 Primary osteoarthritis, right shoulder: Secondary | ICD-10-CM

## 2020-06-21 DIAGNOSIS — I5033 Acute on chronic diastolic (congestive) heart failure: Secondary | ICD-10-CM

## 2020-06-21 DIAGNOSIS — I480 Paroxysmal atrial fibrillation: Secondary | ICD-10-CM

## 2020-06-21 DIAGNOSIS — J441 Chronic obstructive pulmonary disease with (acute) exacerbation: Secondary | ICD-10-CM

## 2020-06-21 DIAGNOSIS — E1151 Type 2 diabetes mellitus with diabetic peripheral angiopathy without gangrene: Secondary | ICD-10-CM

## 2020-06-21 DIAGNOSIS — I70229 Atherosclerosis of native arteries of extremities with rest pain, unspecified extremity: Secondary | ICD-10-CM

## 2020-06-21 DIAGNOSIS — M479 Spondylosis, unspecified: Secondary | ICD-10-CM

## 2020-06-21 DIAGNOSIS — I110 Hypertensive heart disease with heart failure: Secondary | ICD-10-CM

## 2020-06-21 DIAGNOSIS — D509 Iron deficiency anemia, unspecified: Secondary | ICD-10-CM

## 2020-06-21 DIAGNOSIS — E1122 Type 2 diabetes mellitus with diabetic chronic kidney disease: Secondary | ICD-10-CM

## 2020-06-21 DIAGNOSIS — B370 Candidal stomatitis: Secondary | ICD-10-CM

## 2020-06-21 DIAGNOSIS — Z794 Long term (current) use of insulin: Secondary | ICD-10-CM

## 2020-06-21 DIAGNOSIS — N179 Acute kidney failure, unspecified: Secondary | ICD-10-CM

## 2020-06-21 DIAGNOSIS — I0981 Rheumatic heart failure: Secondary | ICD-10-CM

## 2020-06-21 DIAGNOSIS — I080 Rheumatic disorders of both mitral and aortic valves: Secondary | ICD-10-CM

## 2020-06-21 NOTE — Progress Notes (Signed)
Received orders from [place]. Start of care 03/02/20.   Orders from 03/02/20 through 04/30/20 are reviewed, signed and faxed.

## 2020-06-24 DIAGNOSIS — H353221 Exudative age-related macular degeneration, left eye, with active choroidal neovascularization: Secondary | ICD-10-CM | POA: Diagnosis not present

## 2020-06-24 DIAGNOSIS — H353211 Exudative age-related macular degeneration, right eye, with active choroidal neovascularization: Secondary | ICD-10-CM | POA: Diagnosis not present

## 2020-06-25 DIAGNOSIS — H1133 Conjunctival hemorrhage, bilateral: Secondary | ICD-10-CM | POA: Diagnosis not present

## 2020-07-05 ENCOUNTER — Ambulatory Visit: Payer: Self-pay | Admitting: General Practice

## 2020-07-05 DIAGNOSIS — N184 Chronic kidney disease, stage 4 (severe): Secondary | ICD-10-CM

## 2020-07-05 DIAGNOSIS — I10 Essential (primary) hypertension: Secondary | ICD-10-CM

## 2020-07-05 DIAGNOSIS — M79604 Pain in right leg: Secondary | ICD-10-CM

## 2020-07-05 DIAGNOSIS — I480 Paroxysmal atrial fibrillation: Secondary | ICD-10-CM

## 2020-07-05 DIAGNOSIS — I5033 Acute on chronic diastolic (congestive) heart failure: Secondary | ICD-10-CM

## 2020-07-05 DIAGNOSIS — E782 Mixed hyperlipidemia: Secondary | ICD-10-CM

## 2020-07-05 DIAGNOSIS — F419 Anxiety disorder, unspecified: Secondary | ICD-10-CM

## 2020-07-05 DIAGNOSIS — Z794 Long term (current) use of insulin: Secondary | ICD-10-CM

## 2020-07-05 DIAGNOSIS — M79605 Pain in left leg: Secondary | ICD-10-CM

## 2020-07-05 DIAGNOSIS — I110 Hypertensive heart disease with heart failure: Secondary | ICD-10-CM

## 2020-07-05 DIAGNOSIS — J441 Chronic obstructive pulmonary disease with (acute) exacerbation: Secondary | ICD-10-CM

## 2020-07-05 NOTE — Chronic Care Management (AMB) (Signed)
Chronic Care Management   Follow Up Note   07/05/2020 Name: Jill Hudson MRN: [id number] DOB: December 25, 1934  Referred by: [name], MD Reason for referral : Chronic Care Management (RNCM: Follow Chronic Disease Management and Care Coordination Needs)   Jill Hudson is a 85 y.o. year old female who is a primary care patient of Army Melia Jesse Sans, MD. The CCM team was consulted for assistance with chronic disease management and care coordination needs.    Review of patient status, including review of consultants reports, relevant laboratory and other test results, and collaboration with appropriate care team members and the patient's provider was performed as part of comprehensive patient evaluation and provision of chronic care management services.    SDOH (Social Determinants of Health) assessments performed: Yes See Care Plan activities for detailed interventions related to [facility])     Outpatient Encounter Medications as of 07/05/2020  Medication Sig Note  . acetaminophen (TYLENOL) 650 MG CR tablet Take 1,300 mg by mouth every 8 (eight) hours as needed for pain.   Marland Kitchen albuterol (VENTOLIN HFA) 108 (90 Base) MCG/ACT inhaler Inhale 2 puffs into the lungs every 6 (six) hours as needed for wheezing or shortness of breath.   Marland Kitchen apixaban (ELIQUIS) 2.5 MG TABS tablet Take 1 tablet (2.5 mg total) by mouth 2 (two) times daily.   Marland Kitchen aspirin 81 MG chewable tablet Chew by mouth. (Patient not taking: Reported on 06/13/2020)   . B-D ULTRAFINE III SHORT PEN 31G X 8 MM MISC USE AS DIRECTED   . Cyanocobalamin (RA VITAMIN B-12 TR) 1000 MCG TBCR Take 1,000 mcg by mouth daily.    . feeding supplement, GLUCERNA SHAKE, (GLUCERNA SHAKE) LIQD Take 237 mLs by mouth 3 (three) times daily between meals.   . ferrous sulfate 325 (65 FE) MG tablet Take 325 mg by mouth daily with breakfast.   . gabapentin (NEURONTIN) 100 MG capsule Take 1 capsule (100 mg total) by mouth at bedtime. (Patient taking differently: Take 100  mg by mouth 3 (three) times daily. )   . insulin aspart (NOVOLOG FLEXPEN) 100 UNIT/ML FlexPen Inject 3 Units into the skin 3 (three) times daily with meals. At lunch (Patient taking differently: Inject 20 Units into the skin daily. At breakfast- sliding scale) 02/28/2020: 3 units TIDAC  . Insulin Glargine (BASAGLAR KWIKPEN) 100 UNIT/ML SOPN Inject 0.12 mLs (12 Units total) into the skin daily.   . melatonin 5 MG TABS Take 0.5 tablets (2.5 mg total) by mouth at bedtime as needed (sleep).   . metoprolol tartrate (LOPRESSOR) 25 MG tablet Take 1 tablet (25 mg total) by mouth 2 (two) times daily.   . Multiple Vitamins-Minerals (PRESERVISION AREDS PO) Take 1 capsule by mouth 2 (two) times daily.    . pantoprazole (PROTONIX) 40 MG tablet Take by mouth.   . polyethylene glycol powder (GLYCOLAX/MIRALAX) 17 GM/SCOOP powder Take by mouth.   . promethazine-dextromethorphan (PROMETHAZINE-DM) 6.25-15 MG/5ML syrup Take 5 mLs by mouth 4 (four) times daily as needed for cough.   . rosuvastatin (CRESTOR) 5 MG tablet Take 5 mg by mouth every other day. Mon, Wed, Fri and Sat.   . Tiotropium Bromide-Olodaterol 2.5-2.5 MCG/ACT AERS Inhale 2 puffs into the lungs daily. (Patient not taking: Reported on 05/06/2020) 02/28/2020: Has not been able to fill yet  . torsemide (DEMADEX) 20 MG tablet Take 2 tablets (40 mg total) by mouth daily.    No facility-administered encounter medications on file as of 07/05/2020.     Objective:  BP Readings from Last 3 Encounters:  05/30/20 (!) 204/66  05/06/20 (!) 144/36  05/02/20 (!) 132/40    Goals Addressed              This Visit's Progress   .  RNCM: Pt-"I had to go because i had a bad cough" (pt-stated)        Current Barriers:   Knowledge deficit related to basic COPD self care/management  Knowledge deficit related to basic understanding of how to use inhalers and how inhaled medications work  Knowledge deficit related to importance of energy conservation   Nurse Case  Manager Clinical Goal(s):  Over the next 120 days patient will report using inhalers as prescribed including rinsing mouth after use  Over the next 120 days patient will report utilizing pursed lip breathing for shortness of breath  Over the next 120 days, patient will be able to verbalize understanding of COPD action plan and when to seek appropriate levels of medical care  Over the next 120 days, patient will engage in lite exercise as tolerated to build/regain stamina and strength and reduce shortness of breath through activity tolerance   Interventions:   Provided patient with basic written and verbal COPD education on self care/management/and exacerbation prevention   Provided patient with COPD action plan and reinforced importance of daily self assessment  Discussed ER visit on 04-18-2020 for COPD exacerbation and coughing. The patient verbalized that she had a bad cough. Review of taking medications as directed, staying hydrated, and elevation of head when sleeping at night. The patient verbalized she does well during the day but the coughing is worse at night. Education and support given. She states the medication the pcp has given her for her cough really helps her a lot. 07-05-2020: Has not had any new exacerbations with her COPD. States she is doing well with her breathing and it has stabilized at this time.   Provided written and verbal instructions on pursed lip breathing and utilized returned demonstration as teach back  Evaluation of appointment with pcp. The patient has an appointment with pcp on 03-20-2021. The patient has several specialist appointments coming up.  Patient Self Care Activities:   Take medications as prescribed including inhalers  Practice and use pursed lip breathing for shortness of breath recovery and prevention  Self assess COPD action plan zone and make appointment with provider if you have been in the yellow zone for 48 hours without  improvement.  Engage in lite exercise as tolerated 3-5 days a week  Utilize infection prevention strategies to reduce risk of respiratory infection       .  RNCM: Pt-"I have to watch my weight closely" (pt-stated)        CARE PLAN ENTRY (see longitudinal plan of care for additional care plan information)  Current Barriers:  Marland Kitchen Knowledge deficit related to basic heart failure pathophysiology and self care management . Post discharge from the hospital on 02-26-2020 for patient stated "too much water in me"   Nurse Case Manager Clinical Goal(s):   Over the next 120 days, patient will weigh self daily and record  Over the next 120 days, patient will verbalize understanding of Heart Failure Action Plan and when to call doctor  Over the next 120 days, patient will take all Heart Failure mediations as prescribed  Interventions:  . Basic overview and discussion of pathophysiology of Heart Failure . Provided written and verbal education on low sodium diet.  Will send information by My Chart  and EMMI about HF and how to prevent exacerbation. The patient endorses a heart healthy/ADA diet.  She is sad that she can not eat any more hot dogs; however she was able to tell the RNCM healthy food choices to maintain her health and well being with HF/HTN/HLD/DM.  Review how eating salt causes the water to follow in the body. The patient reported cough and swelling in her left leg. 07-05-2020: The patient is doing well and says her ankles are "slim".  Sees the cardiologist in the coming months.  . Reviewed Heart Failure Action Plan in depth.  The patient verbalized she is to call her provider if her weight is less than 113 or greater than 116.  She had follow up with the cardiologist yesterday. She said the cardiology is pleased with her post hospital evaluation. The patient is also taking her blood pressure daily.  07-05-2020: The patients  weight today was 112. Education provided on calling the provider for  weight gain of >3 pounds in one day or >5 in one week. She is being mindful of water weight.  . Assessed for scales in home.  Patient endorses working scales . Discussed importance of daily weight.  Education on weighing first thing every am after first void, with similar clothes for most accurate weight.  Have scales on flat surface.  Call the provider for outside given parameters of wt <113 or >116. Education given to patient on  the importance of weighing daily so subtle changes can be picked up on and intervention made if needed by the provider. 07-05-2020: The patient endorses weighing daily and most of the time BID.   Marland Kitchen Reviewed role of diuretics in prevention of fluid overload.  Education on taking medications as directed. Reviewed safety when taking diuretics.    Patient Self Care Activities:  . Take Heart Failure Medications as prescribed . Weigh daily and record (notify MD with 3 lb weight gain over night or 5 lb in a week) Per the patient the MD wants her weight range to be 113-116. . Follow CHF Action Plan . Adhere to low sodium diet  Please see past updates related to this goal by clicking on the "Past Updates" button in the selected goal      .  RNCM: Pt-"My blood pressure was high this am so I called the doctor"        [place] (see longtitudinal plan of care for additional care plan information)  Current Barriers:  . Chronic Disease Management support, education, and care coordination needs related to HTN, HLD, DMII, and CKD Stage 3  Clinical Goal(s) related to HTN, HLD, DMII, and CKD Stage 3 :  Over the next 120 days, patient will:  . Work with the care management team to address educational, disease management, and care coordination needs  . Begin or continue self health monitoring activities as directed today Measure and record cbg (blood glucose) 3 times daily, Measure and record blood pressure 5 times per week, and adhere to a Heart healthy/ADA diet . Call provider  office for new or worsened signs and symptoms Blood glucose findings outside established parameters, Blood pressure findings outside established parameters, and New or worsened symptom related to CKD3 and HLD . Call care management team with questions or concerns . Verbalize basic understanding of patient centered plan of care established today  Interventions related to HTN, HLD, DMII, and CKD Stage 3 :  . Evaluation of current treatment plans and patient's adherence to  plan as established by provider . Assessed patient understanding of disease states.  The patient verbalized understanding of chronic conditions and endorses compliance with the plan of care . Assessed patient's education and care coordination needs. . Review of blood sugar readings. 07-05-2020: The patient verbalized fast this am was 109. Education on fasting blood sugars as <130 and post pradial <180.  The patients last hemoglobin A1C recorded was 6.6 but she states at the [facility] it is over 8.0.  she has upcoming appointment with the endocrinologist. Discussed the role of steroids causing blood sugars to be elevated. The patient verbalized understanding.   . Review of blood pressure this am and her reading was 125/45- denies any issues with orthostatic hypotension at this time. Reviewed safety when ambulating. 07-05-2020: The patient states her blood pressures have been up and down. The patient says the systolic is usually 035-009 but has been as high as 190. States the bottom number is usually 50-60. Denies headaches when her blood pressure is high. States they may have to adjust her medications for her blood pressures.  . Provided disease specific education to patient- review of Heart healthy/ADA diet, the patient states she has a good appetite just does not have good taste except for sweets. Education and support. [name] with appropriate clinical care team members regarding patient needs.  Denies needs from the CCM LCSW at this  time. Is currently working with the CCM pharmacist.  . Evaluation of leg pain.  The patient states that she is having leg pain from a fall. Working with neurologist. They are trying to figure out a plan to best help her. The patient does not want to do surgery. Will discuss with neurologist. Denies any new falls. Review of safety.   Patient Self Care Activities related to HTN, HLD, DMII, and CKD Stage 3 :  . Patient is unable to independently self-manage chronic health conditions  Please see past updates related to this goal by clicking on the "Past Updates" button in the selected goal          Plan:   The care management team will reach out to the patient again over the next 60 days.    Noreene Larsson RN, MSN, [facility] Mobile: [phone]

## 2020-07-05 NOTE — Patient Instructions (Signed)
Visit Information  Goals Addressed              This Visit's Progress     RNCM: Pt-"I had to go because i had a bad cough" (pt-stated)        Current Barriers:   Knowledge deficit related to basic COPD self care/management  Knowledge deficit related to basic understanding of how to use inhalers and how inhaled medications work  Knowledge deficit related to importance of energy conservation   Nurse Case Manager Clinical Goal(s):  Over the next 120 days patient will report using inhalers as prescribed including rinsing mouth after use  Over the next 120 days patient will report utilizing pursed lip breathing for shortness of breath  Over the next 120 days, patient will be able to verbalize understanding of COPD action plan and when to seek appropriate levels of medical care  Over the next 120 days, patient will engage in lite exercise as tolerated to build/regain stamina and strength and reduce shortness of breath through activity tolerance   Interventions:   Provided patient with basic written and verbal COPD education on self care/management/and exacerbation prevention   Provided patient with COPD action plan and reinforced importance of daily self assessment  Discussed ER visit on 04-18-2020 for COPD exacerbation and coughing. The patient verbalized that she had a bad cough. Review of taking medications as directed, staying hydrated, and elevation of head when sleeping at night. The patient verbalized she does well during the day but the coughing is worse at night. Education and support given. She states the medication the pcp has given her for her cough really helps her a lot. 07-05-2020: Has not had any new exacerbations with her COPD. States she is doing well with her breathing and it has stabilized at this time.   Provided written and verbal instructions on pursed lip breathing and utilized returned demonstration as teach back  Evaluation of appointment with pcp. The patient  has an appointment with pcp on 03-20-2021. The patient has several specialist appointments coming up.  Patient Self Care Activities:   Take medications as prescribed including inhalers  Practice and use pursed lip breathing for shortness of breath recovery and prevention  Self assess COPD action plan zone and make appointment with provider if you have been in the yellow zone for 48 hours without improvement.  Engage in lite exercise as tolerated 3-5 days a week  Utilize infection prevention strategies to reduce risk of respiratory infection         RNCM: Pt-"I have to watch my weight closely" (pt-stated)        CARE PLAN ENTRY (see longitudinal plan of care for additional care plan information)  Current Barriers:   Knowledge deficit related to basic heart failure pathophysiology and self care management  Post discharge from the hospital on 02-26-2020 for patient stated "too much water in me"   Nurse Case Manager Clinical Goal(s):   Over the next 120 days, patient will weigh self daily and record  Over the next 120 days, patient will verbalize understanding of Heart Failure Action Plan and when to call doctor  Over the next 120 days, patient will take all Heart Failure mediations as prescribed  Interventions:   Basic overview and discussion of pathophysiology of Heart Failure  Provided written and verbal education on low sodium diet.  Will send information by My Chart and EMMI about HF and how to prevent exacerbation. The patient endorses a heart healthy/ADA diet.  She is  sad that she can not eat any more hot dogs; however she was able to tell the RNCM healthy food choices to maintain her health and well being with HF/HTN/HLD/DM.  Review how eating salt causes the water to follow in the body. The patient reported cough and swelling in her left leg. 07-05-2020: The patient is doing well and says her ankles are "slim".  Sees the cardiologist in the coming months.   Reviewed Heart  Failure Action Plan in depth.  The patient verbalized she is to call her provider if her weight is less than 113 or greater than 116.  She had follow up with the cardiologist yesterday. She said the cardiology is pleased with her post hospital evaluation. The patient is also taking her blood pressure daily.  07-05-2020: The patients  weight today was 112. Education provided on calling the provider for weight gain of >3 pounds in one day or >5 in one week. She is being mindful of water weight.   Assessed for scales in home.  Patient endorses working scales  Discussed importance of daily weight.  Education on weighing first thing every am after first void, with similar clothes for most accurate weight.  Have scales on flat surface.  Call the provider for outside given parameters of wt <113 or >116. Education given to patient on  the importance of weighing daily so subtle changes can be picked up on and intervention made if needed by the provider. 07-05-2020: The patient endorses weighing daily and most of the time BID.    Reviewed role of diuretics in prevention of fluid overload.  Education on taking medications as directed. Reviewed safety when taking diuretics.    Patient Self Care Activities:   Take Heart Failure Medications as prescribed  Weigh daily and record (notify MD with 3 lb weight gain over night or 5 lb in a week) Per the patient the MD wants her weight range to be 113-116.  Follow CHF Action Plan  Adhere to low sodium diet  Please see past updates related to this goal by clicking on the "Past Updates" button in the selected goal        RNCM: Pt-"My blood pressure was high this am so I called the doctor"        [place] (see longtitudinal plan of care for additional care plan information)  Current Barriers:   Chronic Disease Management support, education, and care coordination needs related to HTN, HLD, DMII, and CKD Stage 3  Clinical Goal(s) related to HTN, HLD, DMII,  and CKD Stage 3 :  Over the next 120 days, patient will:   Work with the care management team to address educational, disease management, and care coordination needs   Begin or continue self health monitoring activities as directed today Measure and record cbg (blood glucose) 3 times daily, Measure and record blood pressure 5 times per week, and adhere to a Heart healthy/ADA diet  Call provider office for new or worsened signs and symptoms Blood glucose findings outside established parameters, Blood pressure findings outside established parameters, and New or worsened symptom related to CKD3 and HLD  Call care management team with questions or concerns  Verbalize basic understanding of patient centered plan of care established today  Interventions related to HTN, HLD, DMII, and CKD Stage 3 :   Evaluation of current treatment plans and patient's adherence to plan as established by provider  Assessed patient understanding of disease states.  The patient verbalized understanding of chronic  conditions and endorses compliance with the plan of care  Assessed patient's education and care coordination needs.  Review of blood sugar readings. 07-05-2020: The patient verbalized fast this am was 109. Education on fasting blood sugars as <130 and post pradial <180.  The patients last hemoglobin A1C recorded was 6.6 but she states at the [facility] it is over 8.0.  she has upcoming appointment with the endocrinologist. Discussed the role of steroids causing blood sugars to be elevated. The patient verbalized understanding.    Review of blood pressure this am and her reading was 125/45- denies any issues with orthostatic hypotension at this time. Reviewed safety when ambulating. 07-05-2020: The patient states her blood pressures have been up and down. The patient says the systolic is usually 291-916 but has been as high as 190. States the bottom number is usually 50-60. Denies headaches when her blood pressure is  high. States they may have to adjust her medications for her blood pressures.   Provided disease specific education to patient- review of Heart healthy/ADA diet, the patient states she has a good appetite just does not have good taste except for sweets. Education and support. [name] with appropriate clinical care team members regarding patient needs.  Denies needs from the CCM LCSW at this time. Is currently working with the CCM pharmacist.   Evaluation of leg pain.  The patient states that she is having leg pain from a fall. Working with neurologist. They are trying to figure out a plan to best help her. The patient does not want to do surgery. Will discuss with neurologist. Denies any new falls. Review of safety.   Patient Self Care Activities related to HTN, HLD, DMII, and CKD Stage 3 :   Patient is unable to independently self-manage chronic health conditions  Please see past updates related to this goal by clicking on the "Past Updates" button in the selected goal         Patient verbalizes understanding of instructions provided today.   The care management team will reach out to the patient again over the next 60 days.   Noreene Larsson RN, MSN, [facility] Mobile: [phone]

## 2020-07-08 DIAGNOSIS — R600 Localized edema: Secondary | ICD-10-CM | POA: Diagnosis not present

## 2020-07-08 DIAGNOSIS — I10 Essential (primary) hypertension: Secondary | ICD-10-CM | POA: Diagnosis not present

## 2020-07-08 DIAGNOSIS — N1832 Chronic kidney disease, stage 3b: Secondary | ICD-10-CM | POA: Diagnosis not present

## 2020-07-08 DIAGNOSIS — R821 Myoglobinuria: Secondary | ICD-10-CM | POA: Diagnosis not present

## 2020-07-08 DIAGNOSIS — N179 Acute kidney failure, unspecified: Secondary | ICD-10-CM | POA: Diagnosis not present

## 2020-07-08 DIAGNOSIS — E1122 Type 2 diabetes mellitus with diabetic chronic kidney disease: Secondary | ICD-10-CM | POA: Diagnosis not present

## 2020-07-20 ENCOUNTER — Emergency Department
Admission: EM | Admit: 2020-07-20 | Discharge: 2020-07-20 | Disposition: A | Discharge status: Home / Self Care | Payer: Medicare Other | Attending: Emergency Medicine | Admitting: Emergency Medicine

## 2020-07-20 ENCOUNTER — Other Ambulatory Visit: Payer: Self-pay

## 2020-07-20 DIAGNOSIS — E1122 Type 2 diabetes mellitus with diabetic chronic kidney disease: Secondary | ICD-10-CM | POA: Insufficient documentation

## 2020-07-20 DIAGNOSIS — J441 Chronic obstructive pulmonary disease with (acute) exacerbation: Secondary | ICD-10-CM | POA: Insufficient documentation

## 2020-07-20 DIAGNOSIS — R0902 Hypoxemia: Secondary | ICD-10-CM | POA: Diagnosis not present

## 2020-07-20 DIAGNOSIS — I130 Hypertensive heart and chronic kidney disease with heart failure and stage 1 through stage 4 chronic kidney disease, or unspecified chronic kidney disease: Secondary | ICD-10-CM | POA: Diagnosis not present

## 2020-07-20 DIAGNOSIS — I10 Essential (primary) hypertension: Secondary | ICD-10-CM | POA: Diagnosis not present

## 2020-07-20 DIAGNOSIS — R52 Pain, unspecified: Secondary | ICD-10-CM | POA: Diagnosis not present

## 2020-07-20 DIAGNOSIS — N1831 Chronic kidney disease, stage 3a: Secondary | ICD-10-CM | POA: Diagnosis not present

## 2020-07-20 DIAGNOSIS — Z794 Long term (current) use of insulin: Secondary | ICD-10-CM | POA: Diagnosis not present

## 2020-07-20 DIAGNOSIS — Z7901 Long term (current) use of anticoagulants: Secondary | ICD-10-CM | POA: Insufficient documentation

## 2020-07-20 DIAGNOSIS — R040 Epistaxis: Secondary | ICD-10-CM | POA: Diagnosis not present

## 2020-07-20 DIAGNOSIS — I5031 Acute diastolic (congestive) heart failure: Secondary | ICD-10-CM | POA: Insufficient documentation

## 2020-07-20 DIAGNOSIS — Z79899 Other long term (current) drug therapy: Secondary | ICD-10-CM | POA: Diagnosis not present

## 2020-07-20 DIAGNOSIS — Z87891 Personal history of nicotine dependence: Secondary | ICD-10-CM | POA: Diagnosis not present

## 2020-07-20 DIAGNOSIS — I959 Hypotension, unspecified: Secondary | ICD-10-CM | POA: Diagnosis not present

## 2020-07-20 DIAGNOSIS — Z7982 Long term (current) use of aspirin: Secondary | ICD-10-CM | POA: Diagnosis not present

## 2020-07-20 LAB — CBC WITH DIFFERENTIAL/PLATELET
Abs Immature Granulocytes: 0.04 10*3/uL (ref 0.00–0.07)
Basophils Absolute: 0 10*3/uL (ref 0.0–0.1)
Basophils Relative: 0 %
Eosinophils Absolute: 0.2 10*3/uL (ref 0.0–0.5)
Eosinophils Relative: 3 %
HCT: 36.9 % (ref 36.0–46.0)
Hemoglobin: 12.5 g/dL (ref 12.0–15.0)
Immature Granulocytes: 1 %
Lymphocytes Relative: 17 %
Lymphs Abs: 1.4 10*3/uL (ref 0.7–4.0)
MCH: 30.6 pg (ref 26.0–34.0)
MCHC: 33.9 g/dL (ref 30.0–36.0)
MCV: 90.4 fL (ref 80.0–100.0)
Monocytes Absolute: 0.7 10*3/uL (ref 0.1–1.0)
Monocytes Relative: 8 %
Neutro Abs: 5.8 10*3/uL (ref 1.7–7.7)
Neutrophils Relative %: 71 %
Platelets: 228 10*3/uL (ref 150–400)
RBC: 4.08 MIL/uL (ref 3.87–5.11)
RDW: 12.3 % (ref 11.5–15.5)
WBC: 8.1 10*3/uL (ref 4.0–10.5)
nRBC: 0 % (ref 0.0–0.2)

## 2020-07-20 LAB — URINALYSIS, COMPLETE (UACMP) WITH MICROSCOPIC
Bacteria, UA: NONE SEEN
Bilirubin Urine: NEGATIVE
Glucose, UA: NEGATIVE mg/dL
Hgb urine dipstick: NEGATIVE
Ketones, ur: NEGATIVE mg/dL
Nitrite: NEGATIVE
Protein, ur: 30 mg/dL — AB
Specific Gravity, Urine: 1.005 (ref 1.005–1.030)
Squamous Epithelial / HPF: NONE SEEN (ref 0–5)
pH: 8 (ref 5.0–8.0)

## 2020-07-20 LAB — BASIC METABOLIC PANEL
Anion gap: 12 (ref 5–15)
BUN: 44 mg/dL — ABNORMAL HIGH (ref 8–23)
CO2: 25 mmol/L (ref 22–32)
Calcium: 10 mg/dL (ref 8.9–10.3)
Chloride: 99 mmol/L (ref 98–111)
Creatinine, Ser: 1.42 mg/dL — ABNORMAL HIGH (ref 0.44–1.00)
GFR calc Af Amer: 39 mL/min — ABNORMAL LOW (ref >60)
GFR calc non Af Amer: 34 mL/min — ABNORMAL LOW (ref >60)
Glucose, Bld: 206 mg/dL — ABNORMAL HIGH (ref 70–99)
Potassium: 4 mmol/L (ref 3.5–5.1)
Sodium: 136 mmol/L (ref 135–145)

## 2020-07-20 LAB — PROTIME-INR
INR: 1.1 (ref 0.8–1.2)
Prothrombin Time: 13.5 seconds (ref 11.4–15.2)

## 2020-07-20 MED ORDER — CLONIDINE HCL 0.1 MG PO TABS
0.2000 mg | ORAL_TABLET | Freq: Once | ORAL | Status: AC
  Administered 2020-07-20: 0.2 mg via ORAL
  Filled 2020-07-20: qty 2

## 2020-07-20 MED ORDER — CLONIDINE HCL 0.2 MG PO TABS: 0.2000 mg | ORAL_TABLET | Freq: Two times a day (BID) | ORAL | 0 refills | Status: DC

## 2020-07-20 NOTE — ED Triage Notes (Addendum)
Pt has had x2 nosebleeds over two days. Pt nose not bleeding now. AOx4. VSS with some HTN (hasn't taken meds)

## 2020-07-20 NOTE — ED Provider Notes (Signed)
The  [facility] Emergency Department Provider Note  ____________________________________________   None    (approximate)  I have reviewed the triage vital signs and the nursing notes.   HISTORY  Chief Complaint Epistaxis  HPI Jill Hudson is a 85 y.o. female who presents to the emergency department via EMS with complaints of nosebleed x2 today.  The patient has also noticed that her blood pressure has been more elevated recently.  She was taken off of 4 blood pressure medications during the hospitalization in March and does not believe that she has been put back on any.  The nose is not currently bleeding at this time.  She denies headache, denies dizziness, denies visual changes.         Past Medical History:  Diagnosis Date  . Acute CHF (congestive heart failure) ([place]) 02/18/2020  . Acute diastolic CHF (congestive heart failure) ([place])   . Acute kidney injury superimposed on CKD ([place])   . Allergies   . Anxiety   . Arthritis    spine and shoulder  . Atherosclerosis of artery of extremity with rest pain ([place]) 10/12/2018  . Cancer ([place])    skin  . CHF (congestive heart failure) ([place])   . Diabetes mellitus without complication ([place])   . Heart murmur   . Hyperlipidemia   . Hypertension   . Macula lutea degeneration   . Mitral and aortic valve disease   . Myocardial infarction [facility])    may have had a "light" heart attack  . Non-ST elevation (NSTEMI) myocardial infarction ([place])   . Occasional tremors   . PAD (peripheral artery disease) ([place])   . Shingles    patient unaware but daughter confirms. it was a long time ago  . Stroke [facility]) 01/2017   may have had a slight stroke  . TIA (transient ischemic attack) 01/2017  . UTI (urinary tract infection)   . Vascular disease, peripheral [facility])     Patient Active Problem List   Diagnosis Date Noted  . Mood disorder ([place]) 05/02/2020  . Chronic heart failure with preserved ejection fraction ([place])  04/12/2020  . Diverticulosis of large intestine without perforation or abscess with bleeding 03/27/2020  . AF (paroxysmal atrial fibrillation) ([place])   . Thrush   . COPD with acute exacerbation ([place])   . CKD (chronic kidney disease), stage IIIa 02/18/2020  . Malnutrition of mild degree ([place]) 01/08/2020  . Acute blood loss anemia   . Diverticulosis of large intestine with hemorrhage   . GI bleeding 12/25/2019  . Rectal bleeding   . Age-related macular degeneration, dry, left eye 06/21/2019  . Age-related macular degeneration, wet, right eye ([place]) 06/21/2019  . Moderate nonproliferative diabetic retinopathy associated with type 2 diabetes mellitus ([place]) 06/21/2019  . Lumbosacral radiculopathy at L4 05/01/2019  . Underweight 05/01/2019  . Encounter for long-term (current) use of aspirin 10/31/2018  . Encounter for long-term (current) use of antiplatelets/antithrombotics 10/31/2018  . Long term current use of oral hypoglycemic drug 10/31/2018  . Encounter for long-term (current) use of insulin ([place]) 10/31/2018  . GIB (gastrointestinal bleeding) 08/11/2018  . Leg pain 07/03/2017  . Carpal tunnel syndrome on both sides 05/05/2017  . Myalgia due to HMG CoA reductase inhibitor 05/05/2017  . Adverse effect of antihyperlipidemic and antiarteriosclerotic drugs, initial encounter 05/05/2017  . History of CVA (cerebrovascular accident) 02/15/2017  . Degenerative disc disease, lumbar 12/21/2016  . Atherosclerosis of native arteries of extremity with intermittent claudication ([place]) 12/20/2016  . Bilateral carotid  artery stenosis 12/20/2016  . Occlusion and stenosis of bilateral carotid arteries 12/20/2016  . Hip bursitis 05/18/2016  . Elevated TSH 01/18/2016  . CKD stage 3 due to type 2 diabetes mellitus ([place]) 01/16/2016  . Senile ecchymosis 01/16/2016  . Type II diabetes mellitus with renal manifestations ([place]) 09/16/2015  . GERD (gastroesophageal reflux disease) 07/24/2015  . PVD (peripheral  vascular disease) ([place]) 05/17/2015  . Neoplasm of uncertain behavior of skin 05/17/2015  . Retinopathy, diabetic, proliferative ([place]) 04/11/2015  . Hyperlipidemia 04/11/2015  . Essential hypertension 04/11/2015  . Generalized OA 04/11/2015  . Proliferative diabetic retinopathy(362.02) 04/11/2015  . Arteriosclerosis of coronary artery 05/29/2013  . Hypertensive heart disease without CHF 05/29/2013    Past Surgical History:  Procedure Laterality Date  . CARDIAC CATHETERIZATION  1998   40% LM, 95% Ramus interm  . CATARACT EXTRACTION, BILATERAL    . COLONOSCOPY WITH PROPOFOL N/A 08/12/2018   Procedure: COLONOSCOPY WITH PROPOFOL;  Surgeon: Lucilla Lame, MD;  Location: [facility] ENDOSCOPY;  Service: Endoscopy;  Laterality: N/A;  . ENDARTERECTOMY FEMORAL Left 10/12/2018   Procedure: ENDARTERECTOMY FEMORAL;  Surgeon: Katha Cabal, MD;  Location: [facility] ORS;  Service: Vascular;  Laterality: Left;  angioplasty and left SFA stent placement  . EYE SURGERY Bilateral    cataract extractions  . LOWER EXTREMITY ANGIOGRAPHY Left 08/23/2017   Procedure: Lower Extremity Angiography;  Surgeon: Algernon Huxley, MD;  Location: [place] CV LAB;  Service: Cardiovascular;  Laterality: Left;  . LOWER EXTREMITY ANGIOGRAPHY Left 07/26/2018   Procedure: LOWER EXTREMITY ANGIOGRAPHY;  Surgeon: Katha Cabal, MD;  Location: [place] CV LAB;  Service: Cardiovascular;  Laterality: Left;  . LOWER EXTREMITY ANGIOGRAPHY Left 09/16/2018   Procedure: LOWER EXTREMITY ANGIOGRAPHY;  Surgeon: Katha Cabal, MD;  Location: [place] CV LAB;  Service: Cardiovascular;  Laterality: Left;  . PTCA  08/2013   Left common iliac  . PTCA  12/2012   left ext iliac  . RIGHT HEART CATH N/A 02/23/2020   Procedure: RIGHT HEART CATH;  Surgeon: Minna Merritts, MD;  Location: [place] CV LAB;  Service: Cardiovascular;  Laterality: N/A;  . TUBAL LIGATION      Prior to Admission medications   Medication Sig Start  Date End Date Taking? Authorizing Provider  acetaminophen (TYLENOL) 650 MG CR tablet Take 1,300 mg by mouth every 8 (eight) hours as needed for pain.    [provider]  albuterol (VENTOLIN HFA) 108 (90 Base) MCG/ACT inhaler Inhale 2 puffs into the lungs every 6 (six) hours as needed for wheezing or shortness of breath. 02/26/20   Loletha Grayer, MD  apixaban (ELIQUIS) 2.5 MG TABS tablet Take 1 tablet (2.5 mg total) by mouth 2 (two) times daily. 05/02/20   [name], MD  aspirin 81 MG chewable tablet Chew by mouth. Patient not taking: Reported on 06/13/2020    [provider]  B-D ULTRAFINE III SHORT PEN 31G X 8 MM MISC USE AS DIRECTED 03/26/20   [name], MD  cloNIDine (CATAPRES) 0.2 MG tablet Take 1 tablet (0.2 mg total) by mouth 2 (two) times daily. Please check blood pressure prior to taking medication.  Only take if blood pressure is greater than 854 systolic. 07/20/20 08/19/20  Marlana Salvage, PA  Cyanocobalamin (RA VITAMIN B-12 TR) 1000 MCG TBCR Take 1,000 mcg by mouth daily.     [provider]  feeding supplement, GLUCERNA SHAKE, (GLUCERNA SHAKE) LIQD Take 237 mLs by mouth 3 (three) times daily  between meals. 12/28/19   Loletha Grayer, MD  ferrous sulfate 325 (65 FE) MG tablet Take 325 mg by mouth daily with breakfast.    [provider]  gabapentin (NEURONTIN) 100 MG capsule Take 1 capsule (100 mg total) by mouth at bedtime. Patient taking differently: Take 100 mg by mouth 3 (three) times daily.  02/26/20   Wieting, Richard, MD  insulin aspart (NOVOLOG FLEXPEN) 100 UNIT/ML FlexPen Inject 3 Units into the skin 3 (three) times daily with meals. At lunch Patient taking differently: Inject 20 Units into the skin daily. At breakfast- sliding scale 01/18/19   [name], MD  Insulin Glargine [facility]) 100 UNIT/ML SOPN Inject 0.12 mLs (12 Units total) into the skin daily. 06/27/19   [name], MD  melatonin 5 MG TABS Take 0.5  tablets (2.5 mg total) by mouth at bedtime as needed (sleep). 02/26/20   Loletha Grayer, MD  metoprolol tartrate (LOPRESSOR) 25 MG tablet Take 1 tablet (25 mg total) by mouth 2 (two) times daily. 02/26/20   Loletha Grayer, MD  Multiple Vitamins-Minerals (PRESERVISION AREDS PO) Take 1 capsule by mouth 2 (two) times daily.     [provider]  pantoprazole (PROTONIX) 40 MG tablet Take by mouth. 12/29/19   [provider]  polyethylene glycol powder (GLYCOLAX/MIRALAX) 17 GM/SCOOP powder Take by mouth.    [provider]  promethazine-dextromethorphan (PROMETHAZINE-DM) 6.25-15 MG/5ML syrup Take 5 mLs by mouth 4 (four) times daily as needed for cough. 05/02/20   [name], MD  rosuvastatin (CRESTOR) 5 MG tablet Take 5 mg by mouth every other day. Mon, Wed, Fri and Sat. 03/02/18   [provider]  Tiotropium Bromide-Olodaterol 2.5-2.5 MCG/ACT AERS Inhale 2 puffs into the lungs daily. Patient not taking: Reported on 05/06/2020 02/26/20   Loletha Grayer, MD  torsemide (DEMADEX) 20 MG tablet Take 2 tablets (40 mg total) by mouth daily. 02/27/20   Loletha Grayer, MD    Allergies Saxagliptin, Epinephrine, Atorvastatin, Codeine, Ezetimibe, Limonene, Nitrofurantoin, and Sulfa antibiotics  Family History  Problem Relation Age of Onset  . Dementia Mother   . Diabetes Father     Social History Social History   Tobacco Use  . Smoking status: Former Smoker    Packs/day: 2.00    Years: 37.00    Pack years: 74.00    Types: Cigarettes    Quit date: 1980    Years since quitting: 41.7  . Smokeless tobacco: Never Used  . Tobacco comment: smoking cessation materials not required  Vaping Use  . Vaping Use: Never used  Substance Use Topics  . Alcohol use: No    Alcohol/week: 0.0 standard drinks  . Drug use: No    Review of Systems Constitutional: No fever/chills Eyes: No visual changes. ENT: + Nosebleed, no sore throat. Cardiovascular: Denies chest  pain. Respiratory: Denies shortness of breath. Gastrointestinal: No abdominal pain.  No nausea, no vomiting.  No diarrhea.  No constipation. Genitourinary: Negative for dysuria. Musculoskeletal: Negative for back pain. Skin: Negative for rash. Neurological: Negative for headaches, focal weakness or numbness.  ____________________________________________   PHYSICAL EXAM:  VITAL SIGNS: ED Triage Vitals  Enc Vitals Group     BP 07/20/20 1606 (!) 179/121     Pulse Rate 07/20/20 1602 70     Resp 07/20/20 1602 18     Temp 07/20/20 1602 97.7 F (36.5 C)     Temp Source 07/20/20 1602 Oral     SpO2 07/20/20 1602 100 %  Weight 07/20/20 1602 116 lb 13.5 oz (53 kg)     Height 07/20/20 1602 5' (1.524 m)     Head Circumference --      Peak Flow --      Pain Score 07/20/20 1602 0     Pain Loc --      Pain Edu? --      Excl. in [place]? --     Constitutional: Alert and oriented x4. Well appearing and in no acute distress. Eyes: Conjunctivae are normal. PERRL. EOMI. Head: Atraumatic. Nose: The right nare has a dried area of blood along the septum without active bleed.  The left nare appears normal. Mouth/Throat: Mucous membranes are moist.  Oropharynx non-erythematous. Neck: No stridor.   Cardiovascular: Normal rate, regular rhythm. Grossly normal heart sounds.  Good peripheral circulation. Respiratory: Normal respiratory effort.  No retractions. Lungs CTAB. Gastrointestinal: Soft and nontender. No distention.  No CVA tenderness. Musculoskeletal: No lower extremity tenderness nor edema.  No joint effusions. Neurologic:  Normal speech and language. No gross focal neurologic deficits are appreciated. No gait instability. Skin:  Skin is warm, dry and intact. No rash noted. Psychiatric: Mood and affect are normal. Speech and behavior are normal.  ____________________________________________   LABS (all labs ordered are listed, but only abnormal results are displayed)  Labs Reviewed   BASIC METABOLIC PANEL - Abnormal; Notable for the following components:      Result Value   Glucose, Bld 206 (*)    BUN 44 (*)    Creatinine, Ser 1.42 (*)    GFR calc non Af Amer 34 (*)    GFR calc Af Amer 39 (*)    All other components within normal limits  URINALYSIS, COMPLETE (UACMP) WITH MICROSCOPIC - Abnormal; Notable for the following components:   Color, Urine STRAW (*)    APPearance CLEAR (*)    Protein, ur 30 (*)    Leukocytes,Ua TRACE (*)    All other components within normal limits  CBC WITH DIFFERENTIAL/PLATELET  PROTIME-INR    ____________________________________________   INITIAL IMPRESSION / ASSESSMENT AND PLAN / ED COURSE  As part of my medical decision making, I reviewed the following data within the [place] notes reviewed and incorporated, Old chart reviewed and Notes from prior ED visits        Kellin Fifer is a 85 year old female who presents to the emergency department with nosebleed x2.  She also notes that her blood pressure has been more elevated recently and believes this could be contributing.  The nose is not actively bleeding and upon physical exam of the naris, it appears to be in a more anterior aspect along the septum.  Of note, her blood pressure upon arrival to the emergency department was 179/121.  After sitting for some time, it was rechecked and was 209/52 on the left arm and 206/76 on the right.   Laboratory evaluation includes a PT/INR which is normal, normal CBC.  BMP and is stable for the patient's norm.  The UA demonstrates some mild leukocytes but no bacteria no other findings.  At this time, the nasal bleed is controlled.  We will treat the patient's blood pressure with 0.2 mg of clonidine.  I will prescribe this for short course on outpatient basis for her to take only when her blood pressure is elevated until she can see her primary care doctor next week and get on something therapeutic for more long-term.   Patient is amenable with this plan.  ____________________________________________   FINAL CLINICAL IMPRESSION(S) / ED DIAGNOSES  Final diagnoses:  Epistaxis  Essential hypertension     ED Discharge Orders         Ordered    cloNIDine (CATAPRES) 0.2 MG tablet  2 times daily        07/20/20 1853          *Please note:  Jill Hudson was evaluated in Emergency Department on 07/20/2020 for the symptoms described in the history of present illness. She was evaluated in the context of the global COVID-19 pandemic, which necessitated consideration that the patient might be at risk for infection with the SARS-CoV-2 virus that causes COVID-19. Institutional protocols and algorithms that pertain to the evaluation of patients at risk for COVID-19 are in a state of rapid change based on information released by regulatory bodies including the CDC and federal and state organizations. These policies and algorithms were followed during the patient's care in the ED.  Some ED evaluations and interventions may be delayed as a result of limited staffing during and the pandemic.*   Note:  This document was prepared using Dragon voice recognition software and may include unintentional dictation errors.    Marlana Salvage, PA 07/20/20 1916    Vladimir Crofts, MD 07/20/20 2206

## 2020-07-20 NOTE — Discharge Instructions (Signed)
Please follow-up with your primary doctor as planned.  You can use the clonidine prescribed if your blood pressure is greater than 160.

## 2020-07-20 NOTE — ED Notes (Signed)
Signature pad unresponsive

## 2020-07-20 NOTE — ED Triage Notes (Signed)
First RN note: pt presents to ED via [facility] with c/o epistaxis x 2 today. Per EMS BP < 200, states recently taken off of her blood pressure medications. Per EMS pt reports nose bleeds lasting approx 15-20 mins each.   EMS reports pt A&O x4.

## 2020-07-22 DIAGNOSIS — C44629 Squamous cell carcinoma of skin of left upper limb, including shoulder: Secondary | ICD-10-CM | POA: Diagnosis not present

## 2020-07-25 DIAGNOSIS — I5032 Chronic diastolic (congestive) heart failure: Secondary | ICD-10-CM | POA: Diagnosis not present

## 2020-07-25 DIAGNOSIS — I110 Hypertensive heart disease with heart failure: Secondary | ICD-10-CM | POA: Diagnosis not present

## 2020-07-25 DIAGNOSIS — N183 Chronic kidney disease, stage 3 unspecified: Secondary | ICD-10-CM | POA: Diagnosis not present

## 2020-07-25 DIAGNOSIS — Z5181 Encounter for therapeutic drug level monitoring: Secondary | ICD-10-CM | POA: Diagnosis not present

## 2020-08-05 ENCOUNTER — Ambulatory Visit: Payer: Medicare Other | Admitting: Internal Medicine

## 2020-08-05 DIAGNOSIS — H353211 Exudative age-related macular degeneration, right eye, with active choroidal neovascularization: Secondary | ICD-10-CM | POA: Diagnosis not present

## 2020-08-05 DIAGNOSIS — H353221 Exudative age-related macular degeneration, left eye, with active choroidal neovascularization: Secondary | ICD-10-CM | POA: Diagnosis not present

## 2020-08-07 ENCOUNTER — Encounter: Payer: Self-pay | Admitting: Internal Medicine

## 2020-08-07 ENCOUNTER — Other Ambulatory Visit: Payer: Self-pay

## 2020-08-07 ENCOUNTER — Ambulatory Visit (INDEPENDENT_AMBULATORY_CARE_PROVIDER_SITE_OTHER): Payer: Medicare Other | Admitting: Internal Medicine

## 2020-08-07 VITALS — BP 106/68 | HR 70 | Temp 98.2°F | Ht 60.0 in | Wt 119.0 lb

## 2020-08-07 DIAGNOSIS — Z23 Encounter for immunization: Secondary | ICD-10-CM

## 2020-08-07 DIAGNOSIS — E1122 Type 2 diabetes mellitus with diabetic chronic kidney disease: Secondary | ICD-10-CM

## 2020-08-07 DIAGNOSIS — I10 Essential (primary) hypertension: Secondary | ICD-10-CM | POA: Diagnosis not present

## 2020-08-07 DIAGNOSIS — N1832 Chronic kidney disease, stage 3b: Secondary | ICD-10-CM | POA: Diagnosis not present

## 2020-08-07 DIAGNOSIS — N183 Chronic kidney disease, stage 3 unspecified: Secondary | ICD-10-CM | POA: Diagnosis not present

## 2020-08-07 DIAGNOSIS — Z5181 Encounter for therapeutic drug level monitoring: Secondary | ICD-10-CM | POA: Diagnosis not present

## 2020-08-07 DIAGNOSIS — J449 Chronic obstructive pulmonary disease, unspecified: Secondary | ICD-10-CM | POA: Diagnosis not present

## 2020-08-07 DIAGNOSIS — N1831 Chronic kidney disease, stage 3a: Secondary | ICD-10-CM

## 2020-08-07 DIAGNOSIS — I214 Non-ST elevation (NSTEMI) myocardial infarction: Secondary | ICD-10-CM | POA: Diagnosis not present

## 2020-08-07 MED ORDER — PROMETHAZINE-DM 6.25-15 MG/5ML PO SYRP: 5.0000 mL | ORAL_SOLUTION | Freq: Four times a day (QID) | ORAL | 0 refills | Status: DC | PRN

## 2020-08-07 NOTE — Progress Notes (Signed)
Date:  08/07/2020   Name:  Jill Hudson   DOB:  Jun 25, 1935   MRN:  [id number]   Chief Complaint: Hypertension (follow up ) and Flu Vaccine  Hypertension This is a chronic problem. The problem is controlled. Associated symptoms include shortness of breath. Pertinent negatives include no chest pain, headaches or palpitations. Past treatments include angiotensin blockers and central alpha agonists. The current treatment provides moderate improvement. Hypertensive end-organ damage includes CAD/MI and PVD.   COPD - she has chronic cough and uses inhalers regularly.  She has frequent white sputum production.  She is bothered mostly by cough at night.  Had been taking promethazine with dextromethorphan.   Lab Results  Component Value Date   CREATININE 1.42 (H) 07/20/2020   BUN 44 (H) 07/20/2020   NA 136 07/20/2020   K 4.0 07/20/2020   CL 99 07/20/2020   CO2 25 07/20/2020   Lab Results  Component Value Date   CHOL 117 02/19/2020   HDL 56 02/19/2020   LDLCALC 54 02/19/2020   TRIG 34 02/19/2020   CHOLHDL 2.1 02/19/2020   Lab Results  Component Value Date   TSH 3.512 12/25/2019   Lab Results  Component Value Date   HGBA1C 6.6 (H) 02/20/2020   Lab Results  Component Value Date   WBC 8.1 07/20/2020   HGB 12.5 07/20/2020   HCT 36.9 07/20/2020   MCV 90.4 07/20/2020   PLT 228 07/20/2020   Lab Results  Component Value Date   ALT 50 (H) 02/21/2020   AST 33 02/21/2020   ALKPHOS 78 02/21/2020   BILITOT 0.7 02/21/2020     Review of Systems  Constitutional: Positive for fatigue. Negative for chills, fever and unexpected weight change.  HENT: Negative for trouble swallowing.   Respiratory: Positive for cough and shortness of breath. Negative for chest tightness and wheezing.   Cardiovascular: Negative for chest pain, palpitations and leg swelling.  Gastrointestinal: Positive for diarrhea (loose stools due to antibiotic therapy for hand). Negative for abdominal pain and  constipation.  Skin: Positive for wound (skin cancer site on left hand).  Neurological: Positive for light-headedness. Negative for dizziness and headaches.  Psychiatric/Behavioral: Positive for sleep disturbance. Negative for dysphoric mood. The patient is not nervous/anxious.     Patient Active Problem List   Diagnosis Date Noted  . Mood disorder ([place]) 05/02/2020  . Chronic heart failure with preserved ejection fraction ([place]) 04/12/2020  . Diverticulosis of large intestine without perforation or abscess with bleeding 03/27/2020  . AF (paroxysmal atrial fibrillation) ([place])   . Thrush   . COPD with acute exacerbation ([place])   . CKD (chronic kidney disease), stage IIIa 02/18/2020  . Malnutrition of mild degree ([place]) 01/08/2020  . Acute blood loss anemia   . Diverticulosis of large intestine with hemorrhage   . GI bleeding 12/25/2019  . Rectal bleeding   . Age-related macular degeneration, dry, left eye 06/21/2019  . Age-related macular degeneration, wet, right eye ([place]) 06/21/2019  . Moderate nonproliferative diabetic retinopathy associated with type 2 diabetes mellitus ([place]) 06/21/2019  . Lumbosacral radiculopathy at L4 05/01/2019  . Underweight 05/01/2019  . Encounter for long-term (current) use of aspirin 10/31/2018  . Encounter for long-term (current) use of antiplatelets/antithrombotics 10/31/2018  . Long term current use of oral hypoglycemic drug 10/31/2018  . Encounter for long-term (current) use of insulin ([place]) 10/31/2018  . GIB (gastrointestinal bleeding) 08/11/2018  . Leg pain 07/03/2017  . Carpal tunnel syndrome on both sides 05/05/2017  .  Myalgia due to HMG CoA reductase inhibitor 05/05/2017  . Adverse effect of antihyperlipidemic and antiarteriosclerotic drugs, initial encounter 05/05/2017  . History of CVA (cerebrovascular accident) 02/15/2017  . Degenerative disc disease, lumbar 12/21/2016  . Atherosclerosis of native arteries of extremity with intermittent  claudication ([place]) 12/20/2016  . Bilateral carotid artery stenosis 12/20/2016  . Occlusion and stenosis of bilateral carotid arteries 12/20/2016  . Hip bursitis 05/18/2016  . Elevated TSH 01/18/2016  . CKD stage 3 due to type 2 diabetes mellitus ([place]) 01/16/2016  . Senile ecchymosis 01/16/2016  . Type II diabetes mellitus with renal manifestations ([place]) 09/16/2015  . GERD (gastroesophageal reflux disease) 07/24/2015  . PVD (peripheral vascular disease) ([place]) 05/17/2015  . Neoplasm of uncertain behavior of skin 05/17/2015  . Retinopathy, diabetic, proliferative ([place]) 04/11/2015  . Hyperlipidemia 04/11/2015  . Essential hypertension 04/11/2015  . Generalized OA 04/11/2015  . Proliferative diabetic retinopathy(362.02) 04/11/2015  . Arteriosclerosis of coronary artery 05/29/2013  . Hypertensive heart disease without CHF 05/29/2013    Allergies  Allergen Reactions  . Saxagliptin Diarrhea  . Epinephrine Other (See Comments)    Patient does not remember what happens when she uses this  . Atorvastatin Other (See Comments)    Muscle aches  . Codeine Other (See Comments)    Upset stomach  . Ezetimibe Other (See Comments)    Myalgias(ZETIA)  . Limonene Rash    Patient does not recall this reaction  . Nitrofurantoin Rash and Other (See Comments)    Pruitus  . Sulfa Antibiotics Rash and Other (See Comments)    Sore mouth     Past Surgical History:  Procedure Laterality Date  . CARDIAC CATHETERIZATION  1998   40% LM, 95% Ramus interm  . CATARACT EXTRACTION, BILATERAL    . COLONOSCOPY WITH PROPOFOL N/A 08/12/2018   Procedure: COLONOSCOPY WITH PROPOFOL;  Surgeon: Lucilla Lame, MD;  Location: [facility] ENDOSCOPY;  Service: Endoscopy;  Laterality: N/A;  . ENDARTERECTOMY FEMORAL Left 10/12/2018   Procedure: ENDARTERECTOMY FEMORAL;  Surgeon: Katha Cabal, MD;  Location: [facility] ORS;  Service: Vascular;  Laterality: Left;  angioplasty and left SFA stent placement  . EYE SURGERY Bilateral     cataract extractions  . LOWER EXTREMITY ANGIOGRAPHY Left 08/23/2017   Procedure: Lower Extremity Angiography;  Surgeon: Algernon Huxley, MD;  Location: [facility];  Service: Cardiovascular;  Laterality: Left;  . LOWER EXTREMITY ANGIOGRAPHY Left 07/26/2018   Procedure: LOWER EXTREMITY ANGIOGRAPHY;  Surgeon: Katha Cabal, MD;  Location: [facility];  Service: Cardiovascular;  Laterality: Left;  . LOWER EXTREMITY ANGIOGRAPHY Left 09/16/2018   Procedure: LOWER EXTREMITY ANGIOGRAPHY;  Surgeon: Katha Cabal, MD;  Location: [facility];  Service: Cardiovascular;  Laterality: Left;  . PTCA  08/2013   Left common iliac  . PTCA  12/2012   left ext iliac  . RIGHT HEART CATH N/A 02/23/2020   Procedure: RIGHT HEART CATH;  Surgeon: Minna Merritts, MD;  Location: [place];  Service: Cardiovascular;  Laterality: N/A;  . TUBAL LIGATION      Social History   Tobacco Use  . Smoking status: Former Smoker    Packs/day: 2.00    Years: 37.00    Pack years: 74.00    Types: Cigarettes    Quit date: 1980    Years since quitting: 41.8  . Smokeless tobacco: Never Used  . Tobacco comment: smoking cessation materials not required  Vaping Use  . Vaping Use: Never used  Substance Use Topics  . Alcohol use: No    Alcohol/week: 0.0 standard drinks  . Drug use: No     Medication list has been reviewed and updated.  Current Meds  Medication Sig  . acetaminophen (TYLENOL) 650 MG CR tablet Take 1,300 mg by mouth every 8 (eight) hours as needed for pain.  Marland Kitchen albuterol (VENTOLIN HFA) 108 (90 Base) MCG/ACT inhaler Inhale 2 puffs into the lungs every 6 (six) hours as needed for wheezing or shortness of breath.  Marland Kitchen apixaban (ELIQUIS) 2.5 MG TABS tablet Take 1 tablet (2.5 mg total) by mouth 2 (two) times daily.  . cloNIDine (CATAPRES) 0.2 MG tablet Take 1 tablet (0.2 mg total) by mouth 2 (two) times daily. Please check blood pressure prior to taking medication.  Only  take if blood pressure is greater than 829 systolic.  . Cyanocobalamin (RA VITAMIN B-12 TR) 1000 MCG TBCR Take 1,000 mcg by mouth daily.   . feeding supplement, GLUCERNA SHAKE, (GLUCERNA SHAKE) LIQD Take 237 mLs by mouth 3 (three) times daily between meals.  . ferrous sulfate 325 (65 FE) MG tablet Take 325 mg by mouth daily with breakfast.  . gabapentin (NEURONTIN) 100 MG capsule Take 1 capsule (100 mg total) by mouth at bedtime. (Patient taking differently: Take 100 mg by mouth at bedtime. )  . insulin aspart (NOVOLOG FLEXPEN) 100 UNIT/ML FlexPen Inject 3 Units into the skin 3 (three) times daily with meals. At lunch (Patient taking differently: Inject 20 Units into the skin daily. At breakfast- sliding scale)  . Insulin Glargine (BASAGLAR KWIKPEN) 100 UNIT/ML SOPN Inject 0.12 mLs (12 Units total) into the skin daily.  Marland Kitchen losartan (COZAAR) 25 MG tablet Take 25 mg by mouth daily. Unsure of dosage  . melatonin 5 MG TABS Take 0.5 tablets (2.5 mg total) by mouth at bedtime as needed (sleep).  . Multiple Vitamins-Minerals (PRESERVISION AREDS PO) Take 1 capsule by mouth 2 (two) times daily.   . promethazine-dextromethorphan (PROMETHAZINE-DM) 6.25-15 MG/5ML syrup Take 5 mLs by mouth 4 (four) times daily as needed for cough.  . rosuvastatin (CRESTOR) 5 MG tablet Take 5 mg by mouth every other day. Mon, Wed, Fri and Sat.  . torsemide (DEMADEX) 20 MG tablet Take 2 tablets (40 mg total) by mouth daily. (Patient taking differently: Take 40 mg by mouth as needed. )    PHQ 2/9 Scores 08/07/2020 05/02/2020 03/18/2020 01/08/2020  PHQ - 2 Score 0 1 2 0  PHQ- 9 Score 2 3 10  0    GAD 7 : Generalized Anxiety Score 08/07/2020 05/06/2020 05/02/2020 03/18/2020  Nervous, Anxious, on Edge 0 0 1 1  Control/stop worrying 0 0 1 2  Worry too much - different things 0 0 1 2  Trouble relaxing 0 0 1 0  Restless 0 0 0 0  Easily annoyed or irritable 2 0 2 0  Afraid - awful might happen 0 0 0 0  Total GAD 7 Score 2 0 6 5  Anxiety  Difficulty Not difficult at all Not difficult at all Not difficult at all Not difficult at all    BP Readings from Last 3 Encounters:  08/07/20 106/68  07/20/20 (!) 160/55  05/30/20 (!) 204/66    Physical Exam Vitals and nursing note reviewed.  Constitutional:      General: She is not in acute distress.    Appearance: She is well-developed.  HENT:     Head: Normocephalic and atraumatic.  Eyes:     Conjunctiva/sclera:  Left eye: Hemorrhage (from recent injections for macular degeneration) present.  Cardiovascular:     Rate and Rhythm: Normal rate and regular rhythm.     Heart sounds: No murmur heard.   Pulmonary:     Effort: Pulmonary effort is normal. No respiratory distress.     Breath sounds: Decreased breath sounds present. No wheezing or rhonchi.  Abdominal:     Palpations: Abdomen is soft.     Tenderness: There is no abdominal tenderness.  Musculoskeletal:     Right lower leg: No edema.     Left lower leg: No edema.  Lymphadenopathy:     Cervical: No cervical adenopathy.  Skin:    General: Skin is warm and dry.     Findings: Wound (surgical wound on dorsum left hand with steristrips in place - healing) present. No rash.  Neurological:     Mental Status: She is alert and oriented to person, place, and time.     Gait: Gait abnormal (uses cane).  Psychiatric:        Attention and Perception: Attention normal.        Mood and Affect: Mood normal.     Wt Readings from Last 3 Encounters:  08/07/20 119 lb (54 kg)  07/20/20 116 lb 13.5 oz (53 kg)  05/30/20 116 lb (52.6 kg)    BP 106/68 (BP Location: Right Arm, Patient Position: Sitting)   Pulse 70   Temp 98.2 F (36.8 C) (Oral)   Ht 5' (1.524 m)   Wt 119 lb (54 kg)   SpO2 96%   BMI 23.24 kg/m   Assessment and Plan: 1. Essential hypertension Clinically exam is stable. Regimen changed to clonidine and losartan 25 mg. Tolerating medications without side effects at this time.  BP is improved at home  ranging 90-120/60-80 Pt to continue current regimen.  Follow up with cardiology.  2. Stage 3b chronic kidney disease ([place]) This has been stable with a GFR slightly improved on last labs = 34  3. COPD (chronic obstructive pulmonary disease) with chronic bronchitis (H[facility]) Using albuterol MDI as needed More troubled by cough at night - will refill non-narcotic cough syrup - promethazine-dextromethorphan (PROMETHAZINE-DM) 6.25-15 MG/5ML syrup; Take 5 mLs by mouth 4 (four) times daily as needed for cough.  Dispense: 180 mL; Refill: 0  4. Type 2 diabetes mellitus with stage 3a chronic kidney disease, without long-term current use of insulin ([place]) Being followed by Endocrinology A1C slightly worse last check Continuing Lantus 12 u AM and metformin was stopped due to renal insuff Increased Novolog to 3 u AC; and 4 u AC if BS> 250   Partially dictated using Editor, commissioning. Any errors are unintentional.  [name], MD [facility]  08/07/2020

## 2020-08-08 ENCOUNTER — Ambulatory Visit: Payer: Medicare Other | Admitting: Pharmacist

## 2020-08-08 DIAGNOSIS — N184 Chronic kidney disease, stage 4 (severe): Secondary | ICD-10-CM

## 2020-08-08 DIAGNOSIS — E1122 Type 2 diabetes mellitus with diabetic chronic kidney disease: Secondary | ICD-10-CM

## 2020-08-08 DIAGNOSIS — I10 Essential (primary) hypertension: Secondary | ICD-10-CM

## 2020-08-08 DIAGNOSIS — Z794 Long term (current) use of insulin: Secondary | ICD-10-CM

## 2020-08-08 NOTE — Chronic Care Management (AMB) (Signed)
Chronic Care Management Pharmacy  Name: LLESENIA FOGAL  MRN: [id number] DOB: 1934/10/27   Chief Complaint/ HPI  [name],  85 y.o. , female presents for their Follow-Up CCM visit with the clinical pharmacist via telephone due to COVID-19 Pandemic. Spoke with daughter, Judson Roch. Home health was in home for father and he was distressed so our visit was abbreviated today.  PCP : [name], MD Patient Care Team: [name], MD as PCP - General (Internal Medicine) [name], Dolores Lory, MD as Consulting Physician (Vascular Surgery) [name], MD as Consulting Physician (Endocrinology) [name], DPM as Consulting Physician (Podiatry) Corey Skains, MD as Consulting Physician (Cardiology) Jannet Mantis, MD as Consulting Physician (Dermatology) Deetta Perla, MD as Consulting Physician (Neurosurgery) [name], RN as Case Manager (General Practice) Vladimir Faster, [facility] (Pharmacist) Murlean Iba, MD (Nephrology)  Their chronic conditions include: Hypertension, Hyperlipidemia, Diabetes, Atrial Fibrillation, Heart Failure, Coronary Artery Disease, GERD, COPD and Chronic Kidney Disease   Office Visits: 08/07/20 - Dr. Army Melia - refill prometh/DM, BP 106/68 05/02/20 - Dr. Army Melia - labs, BP recheck-prometh/codeine for hs cough   Consult Visit: 08/08/20- f/u labs per cards scr 1.49, K 5.5--supposed to recheck 10/15 10/01-21- CGM ordered for endo 07/25/20-Sarah Tye [place], [place], Cardiology - losartan 25 mg bid, amlodipine 2.5 qd, torsemide prn recheck labs 2 weeks, clonidine 0.1 mg uptot  Bid for sbp >180 07/20/20-ED - nose bleed, BP 179/121, clonidine RX given--not picked up 07/08/20- Dr. Candiss Norse , Nephrology - torsemide 43m prn, labs Scr 1.55, GFR~31 06/19/20 -  06/14/20- Dr. OHonor Junes endocrinology - a1C 8.2, Increase Novolog back to 3 units before meals. If BG> 250 ac inc to 4 u 06/07/20 -RN- labs/meds follow up 05/30/20- FEulogio Ditch NP- VStarke no intervention compression stocking 10-15 mmhg 05/17/20- SLuci Bank PA- cardiology - labs, restart losartan 12.5 mg 03/27/20 - Dr. SCandiss Norse Nephrology - No changes, bloodwork 02/23/20- Hospitalized- CHF, ARF, PNA  Allergies  Allergen Reactions   Saxagliptin Diarrhea   Epinephrine Other (See Comments)    Patient does not remember what happens when she uses this   Atorvastatin Other (See Comments)    Muscle aches   Codeine Other (See Comments)    Upset stomach   Ezetimibe Other (See Comments)    Myalgias(ZETIA)   Limonene Rash    Patient does not recall this reaction   Nitrofurantoin Rash and Other (See Comments)    Pruitus   Sulfa Antibiotics Rash and Other (See Comments)    Sore mouth     Medications: Outpatient Encounter Medications as of 08/08/2020  Medication Sig Note   acetaminophen (TYLENOL) 650 MG CR tablet Take 1,300 mg by mouth every 8 (eight) hours as needed for pain.    albuterol (VENTOLIN HFA) 108 (90 Base) MCG/ACT inhaler Inhale 2 puffs into the lungs every 6 (six) hours as needed for wheezing or shortness of breath.    amLODipine (NORVASC) 2.5 MG tablet Take 2.5 mg by mouth daily.    apixaban (ELIQUIS) 2.5 MG TABS tablet Take 1 tablet (2.5 mg total) by mouth 2 (two) times daily.    B-D ULTRAFINE III SHORT PEN 31G X 8 MM MISC USE AS DIRECTED    clindamycin (CLEOCIN) 300 MG capsule Take 300 mg by mouth 3 (three) times daily. Will finish todayu    cloNIDine (CATAPRES) 0.2 MG tablet Take 1 tablet (0.2 mg total) by mouth 2 (two) times daily. Please check blood pressure prior to taking  medication.  Only take if blood pressure is greater than 347 systolic.    Cyanocobalamin (RA VITAMIN B-12 TR) 1000 MCG TBCR Take 1,000 mcg by mouth daily.     feeding supplement, GLUCERNA SHAKE, (GLUCERNA SHAKE) LIQD Take 237 mLs by mouth 3 (three) times daily between meals.    ferrous sulfate 325 (65 FE) MG tablet Take 325 mg by mouth daily with breakfast.    gabapentin  (NEURONTIN) 100 MG capsule Take 1 capsule (100 mg total) by mouth at bedtime. (Patient taking differently: Take 100 mg by mouth at bedtime. )    insulin aspart (NOVOLOG FLEXPEN) 100 UNIT/ML FlexPen Inject 3 Units into the skin 3 (three) times daily with meals. At lunch (Patient taking differently: Inject 20 Units into the skin daily. At breakfast- sliding scale) 02/28/2020: 3 units TIDAC   Insulin Glargine (BASAGLAR KWIKPEN) 100 UNIT/ML SOPN Inject 0.12 mLs (12 Units total) into the skin daily.    losartan (COZAAR) 25 MG tablet Take 12.5 mg by mouth in the morning and at bedtime. Unsure of dosage     melatonin 5 MG TABS Take 0.5 tablets (2.5 mg total) by mouth at bedtime as needed (sleep).    Multiple Vitamins-Minerals (PRESERVISION AREDS PO) Take 1 capsule by mouth 2 (two) times daily.     promethazine-dextromethorphan (PROMETHAZINE-DM) 6.25-15 MG/5ML syrup Take 5 mLs by mouth 4 (four) times daily as needed for cough.    rosuvastatin (CRESTOR) 5 MG tablet Take 5 mg by mouth every other day. Mon, Wed, Fri and Sat.    torsemide (DEMADEX) 20 MG tablet Take 2 tablets (40 mg total) by mouth daily. (Patient taking differently: Take 40 mg by mouth as needed. )    metoprolol tartrate (LOPRESSOR) 25 MG tablet Take 1 tablet (25 mg total) by mouth 2 (two) times daily. (Patient not taking: Reported on 08/07/2020)    No facility-administered encounter medications on file as of 08/08/2020.     Current Diagnosis/Assessment:    Goals Addressed            This Visit's Progress    PharmD "I want to be healthy as I can"       CARE PLAN ENTRY (see longitudinal plan of care for additional care plan information)  Current Barriers:   Chronic Disease Management support, education, and care coordination needs related to Hypertension, Hyperlipidemia, Diabetes, Atrial Fibrillation, Heart Failure, Coronary Artery Disease, GERD, and COPD  Spoke with daughter, Judson Roch. Home health was in home for father  and he was distressed so our visit was abbreviated today.   Hypertension BP Readings from Last 3 Encounters:  08/07/20 106/68  07/20/20 (!) 160/55  05/30/20 (!) 204/66    Pharmacist Clinical Goal(s): o Over the next 60 days, patient will work with PharmD and providers to achieve BP goal <130/80  Current regimen:   Losartan 25 mg bid  Amlodipine 2.5 mg daily  Torsemide 20 mg  prn   Clonidine 0.1 mg up to bid for SBP> 180  Interventions: o Reviewed proper technique for checking BP o Encouraged daughter to contact PCP if pre-med readings contiue to be higher than after administration. Recommend she brings cuff to next appointment for comparison/calibration.  Patient self care activities - Over the next 60 days, patient will: o Check BP bid, document, and provide at future appointments o Ensure daily salt intake < 2300 mg/day   Diabetes Lab Results  Component Value Date/Time   HGBA1C 8.2 06/14/2020 12:00 AM   HGBA1C 6.6 (H) 02/20/2020  04:11 AM   HGBA1C 6.3 (H) 02/18/2020 07:58 AM   HGBA1C 6.5 10/16/2019 12:00 AM    Pharmacist Clinical Goal(s): o Over the next 60 days, patient will work with PharmD and providers to achieve A1c goal <7%  Current regimen:   Novolog 3 units tid with meals, increase to 4 u if AC BG >250  basaglar 12 units after lunch  Interventions: o Reviewed inuslin regimen and timing of Novolog administration o Provided dietary counseling o Offered to help set up [facility] and provided my contact number  Patient self care activities - Over the next 60 days, patient will: o Check blood sugar 3-4 times daily, document, and provide at future appointments o Contact provider with any episodes of hypoglycemia o Contact PharmD for assistance setting up Free Style [place]  Medication management  Pharmacist Clinical Goal(s): o Over the next 60 days, patient will work with PharmD and providers to achieve optimal medication adherence  Current  pharmacy: [facility]  Interventions o Comprehensive medication review performed. 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 Daughter lives in the home and is very involved in managing health care needs. Patient no longer drives.  Polypharmacy; complex patient with multiple comorbidities including Diabetes, hypertension, CKD, CAD,PVD, HF, GERD, Hyperlipidemia  Daughter, Clarise Cruz, manages medications by pill box.  Most recent eGFR: 67ml/min    Pharmacist Clinical Goal(s):   Over the next60 days, patient will work with PharmD and provider towards optimized medication management  Interventions:  Comprehensive medication review performed; medication list updated in electronic medical record  Inter-disciplinary care team collaboration (see longitudinal plan of care)  Counseled patient to take Novolog with meals instead of 1 hour after. Discussed diet and exercise.   Patient Self Care Activities:   Patient will take medications as prescribed  Patient will focus on improved adherence by taking Novolog with meals.  Continue to monitor blood glucose 3 - 4 times daily. Take in the morning before eating (fasting) and ~ 2 hours after meals.  Continue to monitor BP twice daily.  Initial goal documentation        Diabetes/ Neuropathy   A1c goal <7%  Recent Relevant Labs: Lab Results  Component Value Date/Time   HGBA1C 8.2 06/14/2020 12:00 AM   HGBA1C 6.6 (H) 02/20/2020 04:11 AM    HGBA1C 6.3 (H) 02/18/2020 07:58 AM   HGBA1C 6.5 10/16/2019 12:00 AM    Last diabetic Eye exam:  Lab Results  Component Value Date/Time   HMDIABEYEEXA Retinopathy (A) 09/06/2019 12:00 AM    Last diabetic Foot exam: No results found for: HMDIABFOOTEX   Checking BG: 3-4 times daily. Received  FreeStyle libre in mail (ordered by endo per chart notes).  Daughter has not set up and will call if she needs assistance  Recent fasting  Readings: [phone]     Patient has failed these meds in past: saxagliptin-diarrhea Patient is currently uncontrolled on the following medications:  Gabapentin $RemoveBef'100mg'cOjaqvBgLz$  at bedtime  Novolog 3 units tid with meals, increase to 4  u if AC BG >250  basaglar 12 units after lunch  We discussed: Endocrinology increased Novolog at 06/14/20 visit as A1C increased. This could be due I part to the history of steroid injection and incorrect timing of Novolog reported at last visit.  Counseled that this should be taken at the start of meals and not to take if she does not eat. Recommend setting up CMG and begin checking BG each morning, before meals, 2 hours after and at bedtime.  Plan  Continue current medications.     Hypertension/HFpEF/CVA    Labs 06/06/20 Scr 1.76, BUN 43 eGFR 26, K4.6-- k had been 5.5 05/17/20 cardiology following closely BP goal is:  <130/80  Office blood pressures are  BP Readings from Last 3 Encounters:  08/07/20 106/68  07/20/20 (!) 160/55  05/30/20 (!) 204/66   Patient checks BP at home twice daily Patient home BP readings are ranging: 141/61, 136/51,   Patient has failed these meds in the past: Patient is currently query controlled on the following medications:   Losartan 25 mg bid  Amlodipine 2.5 mg daily  Torsemide 20 mg  prn   Clonidine 0.1 mg up to bid for SBP> 180  We discussed patient's daughter takes BP at least twice daily. She reports a recent reading of 99/46 before morning meds and a reading of 108/60 after meds  given. Encouraged daughter to take BP monitor to next appointment and compare to office readings.  There have been several changes to BP meds and cardiology is following closing.   Plan  Continue current medications     Medication Management   Pt uses [facility] for all medications Uses pill box? Yes. Daughter fills for her. Pt endorses compliance  Plan  Continue current medication management strategy    Follow up: 2  month phone visit  Junita Push. Kenton Kingfisher PharmD, [facility] [phone]

## 2020-08-09 DIAGNOSIS — E871 Hypo-osmolality and hyponatremia: Secondary | ICD-10-CM | POA: Diagnosis not present

## 2020-08-09 DIAGNOSIS — Z5181 Encounter for therapeutic drug level monitoring: Secondary | ICD-10-CM | POA: Diagnosis not present

## 2020-08-09 DIAGNOSIS — E876 Hypokalemia: Secondary | ICD-10-CM | POA: Diagnosis not present

## 2020-08-09 DIAGNOSIS — N183 Chronic kidney disease, stage 3 unspecified: Secondary | ICD-10-CM | POA: Diagnosis not present

## 2020-08-15 NOTE — Patient Instructions (Addendum)
Visit Information  It was a pleasure speaking with you today. Thank you for letting me be part of your clinical team. Please call with any questions or concerns.   Goals Addressed            This Visit's Progress   . PharmD "I want to be healthy as I can"       CARE PLAN ENTRY (see longitudinal plan of care for additional care plan information)  Current Barriers:  . Chronic Disease Management support, education, and care coordination needs related to Hypertension, Hyperlipidemia, Diabetes, Atrial Fibrillation, Heart Failure, Coronary Artery Disease, GERD, and COPD . Spoke with daughter, Judson Roch. Home health was in home for father and he was distressed so our visit was abbreviated today.   Hypertension BP Readings from Last 3 Encounters:  08/07/20 106/68  07/20/20 (!) 160/55  05/30/20 (!) 204/66   . Pharmacist Clinical Goal(s): o Over the next 60 days, patient will work with PharmD and providers to achieve BP goal <130/80 . Current regimen:  . Losartan 25 mg bid . Amlodipine 2.5 mg daily . Torsemide 20 mg  prn  . Clonidine 0.1 mg up to bid for SBP> 180 . Interventions: o Reviewed proper technique for checking BP o Encouraged daughter to contact PCP if pre-med readings contiue to be higher than after administration. Recommend she brings cuff to next appointment for comparison/calibration. . Patient self care activities - Over the next 60 days, patient will: o Check BP bid, document, and provide at future appointments o Ensure daily salt intake < 2300 mg/day   Diabetes Lab Results  Component Value Date/Time   HGBA1C 8.2 06/14/2020 12:00 AM   HGBA1C 6.6 (H) 02/20/2020 04:11 AM   HGBA1C 6.3 (H) 02/18/2020 07:58 AM   HGBA1C 6.5 10/16/2019 12:00 AM   . Pharmacist Clinical Goal(s): o Over the next 60 days, patient will work with PharmD and providers to achieve A1c goal <7% . Current regimen:  . Novolog 3 units tid with meals, increase to 4 u if AC BG >250 . basaglar 12 units  after lunch . Interventions: o Reviewed inuslin regimen and timing of Novolog administration o Provided dietary counseling o Offered to help set up [facility] and provided my contact number . Patient self care activities - Over the next 60 days, patient will: o Check blood sugar 3-4 times daily, document, and provide at future appointments o Contact provider with any episodes of hypoglycemia o Contact PharmD for assistance setting up Free Style [place]  Medication management . Pharmacist Clinical Goal(s): o Over the next 60 days, patient will work with PharmD and providers to achieve optimal medication adherence . Current pharmacy: [facility] . Interventions o Comprehensive medication review performed. o Continue current medication management strategy . Patient self care activities - Over the next 60 days, patient will: o Focus on medication adherence by fill dates o Take medications as prescribed o Report any questions or concerns to PharmD and/or provider(s)  Please see past updates related to this goal by clicking on the "Past Updates" button in the selected goal   (see longitudinal plan of care for additional care plan information)  Current Barriers:  . 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 Daughter lives in the home and is very involved in managing health care needs. Patient no longer drives. . Polypharmacy; complex patient with multiple comorbidities including Diabetes, hypertension, CKD, CAD,PVD, HF, GERD, Hyperlipidemia . Daughter, Clarise Cruz, manages medications by pill box. Marland Kitchen  Most recent eGFR: 69m/min .   Pharmacist Clinical Goal(s):  .Marland KitchenOver the next60 days, patient will work with PharmD and provider towards optimized medication management  Interventions: . Comprehensive medication review performed; medication list updated in electronic medical record . Inter-disciplinary care team collaboration (see longitudinal plan of  care) . Counseled patient to take Novolog with meals instead of 1 hour after. Discussed diet and exercise.   Patient Self Care Activities:  . Patient will take medications as prescribed . Patient will focus on improved adherence by taking Novolog with meals. . Continue to monitor blood glucose 3 - 4 times daily. Take in the morning before eating (fasting) and ~ 2 hours after meals. . Continue to monitor BP twice daily.  Initial goal documentation        The patient verbalized understanding of instructions provided today and agreed to receive a mailed copy of patient instruction and/or educational materials.  Telephone follow up appointment with pharmacy team member scheduled for: 2 months   JJunita Push HKenton KingfisherPharmD, BCPS Clinical Pharmacist [phone] Insulin Treatment for Diabetes Mellitus Diabetes (diabetes mellitus) is a long-term (chronic) disease. It occurs when the body does not properly use sugar (glucose) that is released from food after digestion. Glucose levels are controlled by a hormone called insulin. Insulin is made in the pancreas, which is an organ behind the stomach.  If you have type 1 diabetes, you must take insulin because your pancreas does not make any.  If you have type 2 diabetes, you might need to take insulin along with other medicines. In type 2 diabetes, one or both of these problems may be present: ? The pancreas does not make enough insulin. ? Cells in the body do not respond properly to insulin that the body makes (insulin resistance). You must use insulin correctly to control your diabetes. You must have some insulin in your body at all times. Insulin treatment varies depending on your type of diabetes, your treatment goals, and your medical history. Ask questions to understand your insulin treatment plan so you can be an active partner in managing your diabetes. How is insulin given? Insulin can only be given through a shot (injection). It is injected  using a syringe and needle, an insulin pen, a pump, or a jet injector. Your health care provider will:  Prescribe the type and amount of insulin that you need.  Tell you when you should inject your insulin. Where on the body should insulin be injected? Insulin is injected into a layer of fatty tissue under the skin. Good places to inject insulin include:  Abdomen. Generally, the abdomen is the best place to inject insulin. However, you should avoid any area that is less than 2 inches (5 cm) from the belly button (navel).  Front of thigh.  Upper, outer side of thigh.  Upper, outer side of arm.  Upper, outer part of buttock. It is important to:  Give your injection in a slightly different place each time. This helps to prevent irritation and improve absorption.  Avoid injecting into areas that have scar tissue. Usually, you will give yourself insulin injections. Others can also be taught how to give you injections. You will use a special type of syringe that is made only for insulin. Some people may have an insulin pump that delivers insulin steadily through a tube (cannula) that is placed under the skin. What are the different types of insulin? The following information is a general guide to different types of  insulin. Specifics vary depending on the insulin product that your health care provider prescribes.  Rapid-acting insulin: ? Starts working quickly, in as little as 5 minutes. ? Can last for 4-6 hours (or sometimes longer). ? Works well when taken right before a meal to quickly lower your blood glucose.  Short-acting insulin: ? Starts working in about 30 minutes. ? Can last for 6-10 hours. ? Should be taken about 30 minutes before you start eating a meal.  Intermediate-acting insulin: ? Starts working in 1-2 hours. ? Lasts for about 10-18 hours. ? Lowers your blood glucose for a longer period of time, but it is not as effective for lowering blood glucose right after a  meal.  Long-acting insulin: ? Mimics the small amount of insulin that your pancreas usually produces throughout the day. ? Should be used one or two times a day. ? Is usually used in combination with other types of insulin or other medicines.  Concentrated insulin, or U-500 insulin: ? Contains a higher dose of insulin than most rapid-acting insulins. U-500 insulin has 5 times the amount of insulin per 1 mL. ? Should only be used with the special U-500 syringe or U-500 insulin pen. It is dangerous to use the wrong type of syringe with this insulin. What are the side effects of insulin? Possible side effects of insulin treatment include:  Low blood glucose (hypoglycemia).  Weight gain.  High blood glucose (hyperglycemia).  Skin injury or irritation. Some of these side effects can be caused by using improper injection technique. Be sure to learn how to inject insulin properly. What are common terms associated with insulin treatment? Some terms that you might hear include:  Basal insulin, or basal rate. This is the constant amount of insulin that needs to be present in your body to keep your blood glucose levels stable. People who have type 1 diabetes need basal insulin in a steady (continuous) dose 24 hours a day. ? Usually, intermediate-acting or long-acting insulin is used one or two times a day to manage basal insulin levels. ? Medicines that are taken by mouth may also be recommended to manage basal insulin levels.  Prandial or nutrition insulin. This refers to meal-related insulin. ? Blood glucose rises quickly after a meal (postprandial). Rapid-acting or short-acting insulin can be used right before a meal (preprandial) to quickly lower your blood glucose. ? You may be instructed to adjust the amount of prandial insulin that you take, based on how much carbohydrate (starch) is in your meal.  Corrective insulin. This may also be called a correction dose or supplemental dose. This is  a small amount of rapid-acting or short-acting insulin that can be used to lower your blood glucose if it is too high. You may be instructed to check your blood glucose at certain times of the day and use corrective insulin as needed.  Tight control, or intensive therapy. This means keeping your blood glucose as close to your target as possible, and preventing your blood glucose from getting too high after meals. People who have tight control of their diabetes have fewer long-term problems caused by diabetes. Follow these instructions at home: Talk with your health care provider or pharmacist about the type of insulin you should take and when you should take it. You should know when your insulin goes up the most (peaks) and when it wears off. You need this information so you can plan your meals and exercise. Work with your health care provider to:  Check  your blood glucose every day. Your health care provider will tell you how often and when you should do this.  Manage your: ? Weight. ? Blood pressure. ? Cholesterol. ? Stress.  Eat a healthy diet.  Exercise regularly. Summary  Diabetes is a long-term (chronic) disease. It occurs when the body does not properly use sugar (glucose) that is released from food after digestion. Glucose levels are controlled by a hormone called insulin, which is made in an organ behind your stomach (pancreas).  You must use insulin correctly to control your diabetes. You must have some insulin in your body at all times.  Insulin treatment varies depending on your type of diabetes, your treatment goals, and your medical history.  Talk with your health care provider or pharmacist about the type of insulin you should take and when you should take it.  Check your blood glucose every day. Your health care provider will tell you how often and when you should check it. This information is not intended to replace advice given to you by your health care provider. Make  sure you discuss any questions you have with your health care provider. Document Revised: 10/15/2017 Document Reviewed: 11/15/2015 Elsevier Patient Education  2020 [employer].

## 2020-08-19 DIAGNOSIS — H6060 Unspecified chronic otitis externa, unspecified ear: Secondary | ICD-10-CM | POA: Diagnosis not present

## 2020-08-22 ENCOUNTER — Ambulatory Visit: Payer: Self-pay | Admitting: General Practice

## 2020-08-22 DIAGNOSIS — N1832 Chronic kidney disease, stage 3b: Secondary | ICD-10-CM

## 2020-08-22 DIAGNOSIS — E782 Mixed hyperlipidemia: Secondary | ICD-10-CM

## 2020-08-22 DIAGNOSIS — N1831 Chronic kidney disease, stage 3a: Secondary | ICD-10-CM

## 2020-08-22 DIAGNOSIS — I10 Essential (primary) hypertension: Secondary | ICD-10-CM

## 2020-08-22 DIAGNOSIS — J449 Chronic obstructive pulmonary disease, unspecified: Secondary | ICD-10-CM

## 2020-08-22 DIAGNOSIS — I110 Hypertensive heart disease with heart failure: Secondary | ICD-10-CM

## 2020-08-22 DIAGNOSIS — E1122 Type 2 diabetes mellitus with diabetic chronic kidney disease: Secondary | ICD-10-CM

## 2020-08-22 NOTE — Patient Instructions (Signed)
Visit Information  Goals Addressed              This Visit's Progress   .  RNCM: Pt-"I had to go because i had a bad cough" (pt-stated)        Current Barriers:   Knowledge deficit related to basic COPD self care/management  Knowledge deficit related to basic understanding of how to use inhalers and how inhaled medications work  Knowledge deficit related to importance of energy conservation   Nurse Case Manager Clinical Goal(s):  Over the next 120 days patient will report using inhalers as prescribed including rinsing mouth after use  Over the next 120 days patient will report utilizing pursed lip breathing for shortness of breath  Over the next 120 days, patient will be able to verbalize understanding of COPD action plan and when to seek appropriate levels of medical care  Over the next 120 days, patient will engage in lite exercise as tolerated to build/regain stamina and strength and reduce shortness of breath through activity tolerance   Interventions:   Provided patient with basic written and verbal COPD education on self care/management/and exacerbation prevention   Provided patient with COPD action plan and reinforced importance of daily self assessment  Evaluation of COPD exacerbation/Conditions. The patient states that she is doing well. Denies any acute distress related to her COPD. Feels it is stable at this time. Is compliant with medications.   Provided written and verbal instructions on pursed lip breathing and utilized returned demonstration as teach back  Evaluation of appointment with pcp. The patient has an appointment with pcp on 03-20-2021. The patient has several specialist appointments coming up.  Patient Self Care Activities:   Take medications as prescribed including inhalers  Practice and use pursed lip breathing for shortness of breath recovery and prevention  Self assess COPD action plan zone and make appointment with provider if you have been in  the yellow zone for 48 hours without improvement.  Engage in lite exercise as tolerated 3-5 days a week  Utilize infection prevention strategies to reduce risk of respiratory infection       .  RNCM: Pt-"I have to watch my weight closely" (pt-stated)        CARE PLAN ENTRY (see longitudinal plan of care for additional care plan information)  Current Barriers:  Marland Kitchen Knowledge deficit related to basic heart failure pathophysiology and self care management . Post discharge from the hospital on 02-26-2020 for patient stated "too much water in me"   Nurse Case Manager Clinical Goal(s):   Over the next 120 days, patient will weigh self daily and record  Over the next 120 days, patient will verbalize understanding of Heart Failure Action Plan and when to call doctor  Over the next 120 days, patient will take all Heart Failure mediations as prescribed  Interventions:  . Basic overview and discussion of pathophysiology of Heart Failure . Provided written and verbal education on low sodium diet.  Will send information by My Chart and EMMI about HF and how to prevent exacerbation. The patient endorses a heart healthy/ADA diet.  She is sad that she can not eat any more hot dogs; however she was able to tell the RNCM healthy food choices to maintain her health and well being with HF/HTN/HLD/DM.  Review how eating salt causes the water to follow in the body. The patient reported cough and swelling in her left leg. 08-22-2020: The patient is doing well and says her ankles are "slim".  Sees the cardiologist in the coming months. Denies any issues with edema at this time. Will continue to monitor.  . Reviewed Heart Failure Action Plan in depth.  The patient verbalized she is to call her provider if her weight is less than 113 or greater than 116.  She had follow up with the cardiologist yesterday. She said the cardiology is pleased with her post hospital evaluation. The patient is also taking her blood pressure  daily.  07-05-2020: The patients  weight today was 112. Education provided on calling the provider for weight gain of >3 pounds in one day or >5 in one week. She is being mindful of water weight. 08-22-2020: The patients weight in the office recently was 119.  The patient had clothes on. The patient denies any acute distress.  . Assessed for scales in home.  Patient endorses working scales . Discussed importance of daily weight.  Education on weighing first thing every am after first void, with similar clothes for most accurate weight.  Have scales on flat surface.  Call the provider for outside given parameters of wt <113 or >116. Education given to patient on  the importance of weighing daily so subtle changes can be picked up on and intervention made if needed by the provider. 07-05-2020: The patient endorses weighing daily and most of the time BID.   Marland Kitchen Reviewed role of diuretics in prevention of fluid overload.  Education on taking medications as directed. Reviewed safety when taking diuretics.    Patient Self Care Activities:  . Take Heart Failure Medications as prescribed . Weigh daily and record (notify MD with 3 lb weight gain over night or 5 lb in a week) Per the patient the MD wants her weight range to be 113-116. . Follow CHF Action Plan . Adhere to low sodium diet  Please see past updates related to this goal by clicking on the "Past Updates" button in the selected goal      .  RNCM: Pt-"My blood pressure was high this am so I called the doctor"        [place] (see longtitudinal plan of care for additional care plan information)  Current Barriers:  . Chronic Disease Management support, education, and care coordination needs related to HTN, HLD, DMII, and CKD Stage 3  Clinical Goal(s) related to HTN, HLD, DMII, and CKD Stage 3 :  Over the next 120 days, patient will:  . Work with the care management team to address educational, disease management, and care coordination needs    . Begin or continue self health monitoring activities as directed today Measure and record cbg (blood glucose) 3 times daily, Measure and record blood pressure 5 times per week, and adhere to a Heart healthy/ADA diet . Call provider office for new or worsened signs and symptoms Blood glucose findings outside established parameters, Blood pressure findings outside established parameters, and New or worsened symptom related to CKD3 and HLD . Call care management team with questions or concerns . Verbalize basic understanding of patient centered plan of care established today  Interventions related to HTN, HLD, DMII, and CKD Stage 3 :  . Evaluation of current treatment plans and patient's adherence to plan as established by provider.  08-22-2020: the patient states she has been having elevations in her blood pressure. She has a prn medication to take with parameters. This am her systolic pressure was 893 and she could not remember the bottom part. The patient had a slight headache.  The patient says that she has been having up and down blood pressures. Stress does cause it to go higher.   . Assessed patient understanding of disease states.  The patient verbalized understanding of chronic conditions and endorses compliance with the plan of care . Assessed patient's education and care coordination needs. . Review of blood sugar readings. 07-05-2020: The patient verbalized fast this am was 109. Education on fasting blood sugars as <130 and post pradial <180.  The patients last hemoglobin A1C recorded was 6.6 but she states at the [facility] it is over 8.0.  she has upcoming appointment with the endocrinologist. Discussed the role of steroids causing blood sugars to be elevated. The patient verbalized understanding.   . Review of blood pressure this am and her reading was 125/45- denies any issues with orthostatic hypotension at this time. Reviewed safety when ambulating. 08-22-2020: The patient states her blood  pressures have been up and down. The patient says the systolic is usually 563-149 but has been as high as 190, with it being 175 this am. States the bottom number is usually 50-60. States they may have to adjust her medications for her blood pressures. The patient states that she was trying to determine what she did yesterday to make it so high. Education on managing stress and anxiety. She says sometimes it is elevated because of her husband and his illness and how it impacts her.  . Provided disease specific education to patient- review of Heart healthy/ADA diet, the patient states she has a good appetite just does not have good taste except for sweets. Education and support. [name] with appropriate clinical care team members regarding patient needs.  Denies needs from the CCM LCSW at this time. Is currently working with the CCM pharmacist.  . Evaluation of leg pain.  The patient states that she is having leg pain from a fall. Working with neurologist. They are trying to figure out a plan to best help her. The patient does not want to do surgery. Will discuss with neurologist. Denies any new falls. Review of safety. 08-22-2020: the patient denies any new falls. Is utilizing her walker and doing well.  Patient Self Care Activities related to HTN, HLD, DMII, and CKD Stage 3 :  . Patient is unable to independently self-manage chronic health conditions  Please see past updates related to this goal by clicking on the "Past Updates" button in the selected goal         Patient verbalizes understanding of instructions provided today.   The care management team will reach out to the patient again over the next 30 to 60 days.   Noreene Larsson RN, MSN, [place] [facility] Mobile: [phone]

## 2020-08-22 NOTE — Chronic Care Management (AMB) (Signed)
Chronic Care Management   Follow Up Note   08/22/2020 Name: Jill Hudson MRN: [id number] DOB: February 13, 1935  Referred by: [name], MD Reason for referral : Chronic Care Management (RNCM Follow up for Chronic Disease Management and Care Coordination Needs)   Jill Hudson is a 85 y.o. year old female who is a primary care patient of [facility] Melia Jesse Sans, MD. The CCM team was consulted for assistance with chronic disease management and care coordination needs.    Review of patient status, including review of consultants reports, relevant laboratory and other test results, and collaboration with appropriate care team members and the patient's provider was performed as part of comprehensive patient evaluation and provision of chronic care management services.    SDOH (Social Determinants of Health) assessments performed: Yes See Care Plan activities for detailed interventions related to [facility])     Outpatient Encounter Medications as of 08/22/2020  Medication Sig Note  . acetaminophen (TYLENOL) 650 MG CR tablet Take 1,300 mg by mouth every 8 (eight) hours as needed for pain.   Marland Kitchen albuterol (VENTOLIN HFA) 108 (90 Base) MCG/ACT inhaler Inhale 2 puffs into the lungs every 6 (six) hours as needed for wheezing or shortness of breath.   Marland Kitchen amLODipine (NORVASC) 2.5 MG tablet Take 2.5 mg by mouth daily.   Marland Kitchen apixaban (ELIQUIS) 2.5 MG TABS tablet Take 1 tablet (2.5 mg total) by mouth 2 (two) times daily.   . B-D ULTRAFINE III SHORT PEN 31G X 8 MM MISC USE AS DIRECTED   . clindamycin (CLEOCIN) 300 MG capsule Take 300 mg by mouth 3 (three) times daily. Will finish todayu   . cloNIDine (CATAPRES) 0.2 MG tablet Take 1 tablet (0.2 mg total) by mouth 2 (two) times daily. Please check blood pressure prior to taking medication.  Only take if blood pressure is greater than 270 systolic.   . Cyanocobalamin (RA VITAMIN B-12 TR) 1000 MCG TBCR Take 1,000 mcg by mouth daily.    . feeding supplement, GLUCERNA  SHAKE, (GLUCERNA SHAKE) LIQD Take 237 mLs by mouth 3 (three) times daily between meals.   . ferrous sulfate 325 (65 FE) MG tablet Take 325 mg by mouth daily with breakfast.   . gabapentin (NEURONTIN) 100 MG capsule Take 1 capsule (100 mg total) by mouth at bedtime. (Patient taking differently: Take 100 mg by mouth at bedtime. )   . insulin aspart (NOVOLOG FLEXPEN) 100 UNIT/ML FlexPen Inject 3 Units into the skin 3 (three) times daily with meals. At lunch (Patient taking differently: Inject 20 Units into the skin daily. At breakfast- sliding scale) 02/28/2020: 3 units TIDAC  . Insulin Glargine (BASAGLAR KWIKPEN) 100 UNIT/ML SOPN Inject 0.12 mLs (12 Units total) into the skin daily.   Marland Kitchen losartan (COZAAR) 25 MG tablet Take 12.5 mg by mouth in the morning and at bedtime. Unsure of dosage    . melatonin 5 MG TABS Take 0.5 tablets (2.5 mg total) by mouth at bedtime as needed (sleep).   . metoprolol tartrate (LOPRESSOR) 25 MG tablet Take 1 tablet (25 mg total) by mouth 2 (two) times daily. (Patient not taking: Reported on 08/07/2020)   . Multiple Vitamins-Minerals (PRESERVISION AREDS PO) Take 1 capsule by mouth 2 (two) times daily.    . promethazine-dextromethorphan (PROMETHAZINE-DM) 6.25-15 MG/5ML syrup Take 5 mLs by mouth 4 (four) times daily as needed for cough.   . rosuvastatin (CRESTOR) 5 MG tablet Take 5 mg by mouth every other day. Mon, Wed, Fri and Sat.   Marland Kitchen  torsemide (DEMADEX) 20 MG tablet Take 2 tablets (40 mg total) by mouth daily. (Patient taking differently: Take 40 mg by mouth as needed. )    No facility-administered encounter medications on file as of 08/22/2020.     Objective:  BP Readings from Last 3 Encounters:  08/07/20 106/68  07/20/20 (!) 160/55  05/30/20 (!) 204/66    Goals Addressed              This Visit's Progress   .  RNCM: Pt-"I had to go because i had a bad cough" (pt-stated)        Current Barriers:   Knowledge deficit related to basic COPD self  care/management  Knowledge deficit related to basic understanding of how to use inhalers and how inhaled medications work  Knowledge deficit related to importance of energy conservation   Nurse Case Manager Clinical Goal(s):  Over the next 120 days patient will report using inhalers as prescribed including rinsing mouth after use  Over the next 120 days patient will report utilizing pursed lip breathing for shortness of breath  Over the next 120 days, patient will be able to verbalize understanding of COPD action plan and when to seek appropriate levels of medical care  Over the next 120 days, patient will engage in lite exercise as tolerated to build/regain stamina and strength and reduce shortness of breath through activity tolerance   Interventions:   Provided patient with basic written and verbal COPD education on self care/management/and exacerbation prevention   Provided patient with COPD action plan and reinforced importance of daily self assessment  Evaluation of COPD exacerbation/Conditions. The patient states that she is doing well. Denies any acute distress related to her COPD. Feels it is stable at this time. Is compliant with medications.   Provided written and verbal instructions on pursed lip breathing and utilized returned demonstration as teach back  Evaluation of appointment with pcp. The patient has an appointment with pcp on 03-20-2021. The patient has several specialist appointments coming up.  Patient Self Care Activities:   Take medications as prescribed including inhalers  Practice and use pursed lip breathing for shortness of breath recovery and prevention  Self assess COPD action plan zone and make appointment with provider if you have been in the yellow zone for 48 hours without improvement.  Engage in lite exercise as tolerated 3-5 days a week  Utilize infection prevention strategies to reduce risk of respiratory infection       .  RNCM: Pt-"I have  to watch my weight closely" (pt-stated)        CARE PLAN ENTRY (see longitudinal plan of care for additional care plan information)  Current Barriers:  Marland Kitchen Knowledge deficit related to basic heart failure pathophysiology and self care management . Post discharge from the hospital on 02-26-2020 for patient stated "too much water in me"   Nurse Case Manager Clinical Goal(s):   Over the next 120 days, patient will weigh self daily and record  Over the next 120 days, patient will verbalize understanding of Heart Failure Action Plan and when to call doctor  Over the next 120 days, patient will take all Heart Failure mediations as prescribed  Interventions:  . Basic overview and discussion of pathophysiology of Heart Failure . Provided written and verbal education on low sodium diet.  Will send information by My Chart and EMMI about HF and how to prevent exacerbation. The patient endorses a heart healthy/ADA diet.  She is sad that she can not  eat any more hot dogs; however she was able to tell the RNCM healthy food choices to maintain her health and well being with HF/HTN/HLD/DM.  Review how eating salt causes the water to follow in the body. The patient reported cough and swelling in her left leg. 08-22-2020: The patient is doing well and says her ankles are "slim".  Sees the cardiologist in the coming months. Denies any issues with edema at this time. Will continue to monitor.  . Reviewed Heart Failure Action Plan in depth.  The patient verbalized she is to call her provider if her weight is less than 113 or greater than 116.  She had follow up with the cardiologist yesterday. She said the cardiology is pleased with her post hospital evaluation. The patient is also taking her blood pressure daily.  07-05-2020: The patients  weight today was 112. Education provided on calling the provider for weight gain of >3 pounds in one day or >5 in one week. She is being mindful of water weight. 08-22-2020: The  patients weight in the office recently was 119.  The patient had clothes on. The patient denies any acute distress.  . Assessed for scales in home.  Patient endorses working scales . Discussed importance of daily weight.  Education on weighing first thing every am after first void, with similar clothes for most accurate weight.  Have scales on flat surface.  Call the provider for outside given parameters of wt <113 or >116. Education given to patient on  the importance of weighing daily so subtle changes can be picked up on and intervention made if needed by the provider. 07-05-2020: The patient endorses weighing daily and most of the time BID.   Marland Kitchen Reviewed role of diuretics in prevention of fluid overload.  Education on taking medications as directed. Reviewed safety when taking diuretics.    Patient Self Care Activities:  . Take Heart Failure Medications as prescribed . Weigh daily and record (notify MD with 3 lb weight gain over night or 5 lb in a week) Per the patient the MD wants her weight range to be 113-116. . Follow CHF Action Plan . Adhere to low sodium diet  Please see past updates related to this goal by clicking on the "Past Updates" button in the selected goal      .  RNCM: Pt-"My blood pressure was high this am so I called the doctor"        [place] (see longtitudinal plan of care for additional care plan information)  Current Barriers:  . Chronic Disease Management support, education, and care coordination needs related to HTN, HLD, DMII, and CKD Stage 3  Clinical Goal(s) related to HTN, HLD, DMII, and CKD Stage 3 :  Over the next 120 days, patient will:  . Work with the care management team to address educational, disease management, and care coordination needs  . Begin or continue self health monitoring activities as directed today Measure and record cbg (blood glucose) 3 times daily, Measure and record blood pressure 5 times per week, and adhere to a Heart  healthy/ADA diet . Call provider office for new or worsened signs and symptoms Blood glucose findings outside established parameters, Blood pressure findings outside established parameters, and New or worsened symptom related to CKD3 and HLD . Call care management team with questions or concerns . Verbalize basic understanding of patient centered plan of care established today  Interventions related to HTN, HLD, DMII, and CKD Stage 3 :  .  Evaluation of current treatment plans and patient's adherence to plan as established by provider.  08-22-2020: the patient states she has been having elevations in her blood pressure. She has a prn medication to take with parameters. This am her systolic pressure was 449 and she could not remember the bottom part. The patient had a slight headache.  The patient says that she has been having up and down blood pressures. Stress does cause it to go higher.   . Assessed patient understanding of disease states.  The patient verbalized understanding of chronic conditions and endorses compliance with the plan of care . Assessed patient's education and care coordination needs. . Review of blood sugar readings. 07-05-2020: The patient verbalized fast this am was 109. Education on fasting blood sugars as <130 and post pradial <180.  The patients last hemoglobin A1C recorded was 6.6 but she states at the [facility] it is over 8.0.  she has upcoming appointment with the endocrinologist. Discussed the role of steroids causing blood sugars to be elevated. The patient verbalized understanding.   . Review of blood pressure this am and her reading was 125/45- denies any issues with orthostatic hypotension at this time. Reviewed safety when ambulating. 08-22-2020: The patient states her blood pressures have been up and down. The patient says the systolic is usually 675-916 but has been as high as 190, with it being 175 this am. States the bottom number is usually 50-60. States they may have to  adjust her medications for her blood pressures. The patient states that she was trying to determine what she did yesterday to make it so high. Education on managing stress and anxiety. She says sometimes it is elevated because of her husband and his illness and how it impacts her.  . Provided disease specific education to patient- review of Heart healthy/ADA diet, the patient states she has a good appetite just does not have good taste except for sweets. Education and support. [name] with appropriate clinical care team members regarding patient needs.  Denies needs from the CCM LCSW at this time. Is currently working with the CCM pharmacist.  . Evaluation of leg pain.  The patient states that she is having leg pain from a fall. Working with neurologist. They are trying to figure out a plan to best help her. The patient does not want to do surgery. Will discuss with neurologist. Denies any new falls. Review of safety. 08-22-2020: the patient denies any new falls. Is utilizing her walker and doing well.  Patient Self Care Activities related to HTN, HLD, DMII, and CKD Stage 3 :  . Patient is unable to independently self-manage chronic health conditions  Please see past updates related to this goal by clicking on the "Past Updates" button in the selected goal          Plan:   The care management team will reach out to the patient again over the next 30 to 60 days.    Noreene Larsson RN, MSN, [place] [facility] Mobile: [phone]

## 2020-08-26 ENCOUNTER — Ambulatory Visit: Payer: Medicare Other

## 2020-08-28 ENCOUNTER — Ambulatory Visit: Payer: Medicare Other

## 2020-09-02 ENCOUNTER — Ambulatory Visit: Payer: Medicare Other

## 2020-09-02 DIAGNOSIS — B351 Tinea unguium: Secondary | ICD-10-CM | POA: Diagnosis not present

## 2020-09-02 DIAGNOSIS — E1142 Type 2 diabetes mellitus with diabetic polyneuropathy: Secondary | ICD-10-CM | POA: Diagnosis not present

## 2020-09-02 DIAGNOSIS — Z794 Long term (current) use of insulin: Secondary | ICD-10-CM | POA: Diagnosis not present

## 2020-09-03 DIAGNOSIS — I5032 Chronic diastolic (congestive) heart failure: Secondary | ICD-10-CM | POA: Diagnosis not present

## 2020-09-03 DIAGNOSIS — I10 Essential (primary) hypertension: Secondary | ICD-10-CM | POA: Diagnosis not present

## 2020-09-03 DIAGNOSIS — I2510 Atherosclerotic heart disease of native coronary artery without angina pectoris: Secondary | ICD-10-CM | POA: Diagnosis not present

## 2020-09-03 DIAGNOSIS — E785 Hyperlipidemia, unspecified: Secondary | ICD-10-CM | POA: Diagnosis not present

## 2020-09-03 DIAGNOSIS — I480 Paroxysmal atrial fibrillation: Secondary | ICD-10-CM | POA: Diagnosis not present

## 2020-09-03 DIAGNOSIS — N183 Chronic kidney disease, stage 3 unspecified: Secondary | ICD-10-CM | POA: Diagnosis not present

## 2020-09-04 ENCOUNTER — Ambulatory Visit (INDEPENDENT_AMBULATORY_CARE_PROVIDER_SITE_OTHER): Payer: Medicare Other

## 2020-09-04 DIAGNOSIS — Z0000 Encounter for general adult medical examination without abnormal findings: Secondary | ICD-10-CM | POA: Diagnosis not present

## 2020-09-04 NOTE — Progress Notes (Signed)
Subjective:   Jill Hudson is a 85 y.o. female who presents for Medicare Annual (Subsequent) preventive examination.  Virtual Visit via Telephone Note  I connected with  Jill Hudson on 09/04/20 at  2:00 PM EST by telephone and verified that I am speaking with the correct person using two identifiers.  Medicare Annual Wellness visit completed telephonically due to Covid-19 pandemic.   Location: Patient: home  Provider: [facility]   I discussed the limitations, risks, security and privacy concerns of performing an evaluation and management service by telephone and the availability of in person appointments. The patient expressed understanding and agreed to proceed.  Unable to perform video visit due to video visit attempted and failed and/or patient does not have video capability.   Some vital signs may be absent or patient reported.   Clemetine Marker, LPN    Review of Systems     Cardiac Risk Factors include: advanced age (>81men, >15 women);diabetes mellitus;dyslipidemia;hypertension     Objective:    Today's Vitals   09/04/20 1404  PainSc: 5    There is no height or weight on file to calculate BMI.  Advanced Directives 09/04/2020 07/20/2020 03/08/2020 02/23/2020 02/18/2020 01/06/2020 12/26/2019  Does Patient Have a Medical Advance Directive? Yes No Yes Yes No Yes Yes  Type of Paramedic of [place];Living will - Living will;Healthcare Power of Attorney Living will;Healthcare Power of [place];Living will [place]  Does patient want to make changes to medical advance directive? - - No - Patient declined No - Patient declined - - No - Patient declined  Copy of [place] in Chart? No - copy requested - No - copy requested No - copy requested - - -  Would patient like information on creating a medical advance directive? - - - - No - Guardian declined - -    Current Medications  (verified) Outpatient Encounter Medications as of 09/04/2020  Medication Sig  . acetaminophen (TYLENOL) 650 MG CR tablet Take 1,300 mg by mouth every 8 (eight) hours as needed for pain.  Marland Kitchen albuterol (VENTOLIN HFA) 108 (90 Base) MCG/ACT inhaler Inhale 2 puffs into the lungs every 6 (six) hours as needed for wheezing or shortness of breath.  Marland Kitchen amLODipine (NORVASC) 5 MG tablet Take 5 mg by mouth daily.  Marland Kitchen apixaban (ELIQUIS) 2.5 MG TABS tablet Take 1 tablet (2.5 mg total) by mouth 2 (two) times daily.  Marland Kitchen augmented betamethasone dipropionate (DIPROLENE-AF) 0.05 % ointment Apply topically.  . B-D ULTRAFINE III SHORT PEN 31G X 8 MM MISC USE AS DIRECTED  . clindamycin (CLEOCIN) 300 MG capsule Take 300 mg by mouth 3 (three) times daily. Will finish todayu  . Cyanocobalamin (RA VITAMIN B-12 TR) 1000 MCG TBCR Take 1,000 mcg by mouth daily.   . feeding supplement, GLUCERNA SHAKE, (GLUCERNA SHAKE) LIQD Take 237 mLs by mouth 3 (three) times daily between meals.  . ferrous sulfate 325 (65 FE) MG tablet Take 325 mg by mouth daily with breakfast.  . gabapentin (NEURONTIN) 100 MG capsule Take 1 capsule (100 mg total) by mouth at bedtime. (Patient taking differently: Take 100 mg by mouth at bedtime. )  . insulin aspart (NOVOLOG FLEXPEN) 100 UNIT/ML FlexPen Inject 3 Units into the skin 3 (three) times daily with meals. At lunch (Patient taking differently: Inject 20 Units into the skin daily. At breakfast- sliding scale)  . Insulin Glargine (BASAGLAR KWIKPEN) 100 UNIT/ML SOPN Inject 0.12 mLs (  12 Units total) into the skin daily.  Marland Kitchen losartan (COZAAR) 25 MG tablet Take 12.5 mg by mouth in the morning and at bedtime. Unsure of dosage   . melatonin 5 MG TABS Take 0.5 tablets (2.5 mg total) by mouth at bedtime as needed (sleep).  . metoprolol tartrate (LOPRESSOR) 25 MG tablet Take 1 tablet (25 mg total) by mouth 2 (two) times daily.  . Multiple Vitamins-Minerals (PRESERVISION AREDS PO) Take 1 capsule by mouth 2 (two)  times daily.   . promethazine-dextromethorphan (PROMETHAZINE-DM) 6.25-15 MG/5ML syrup Take 5 mLs by mouth 4 (four) times daily as needed for cough.  . rosuvastatin (CRESTOR) 5 MG tablet Take 5 mg by mouth every other day. Mon, Wed, Fri and Sat.  . torsemide (DEMADEX) 20 MG tablet Take 2 tablets (40 mg total) by mouth daily. (Patient taking differently: Take 40 mg by mouth as needed. )  . cloNIDine (CATAPRES) 0.2 MG tablet Take 1 tablet (0.2 mg total) by mouth 2 (two) times daily. Please check blood pressure prior to taking medication.  Only take if blood pressure is greater than 719 systolic.  . [DISCONTINUED] amLODipine (NORVASC) 2.5 MG tablet Take 2.5 mg by mouth daily.   No facility-administered encounter medications on file as of 09/04/2020.    Allergies (verified) Saxagliptin, Epinephrine, Atorvastatin, Codeine, Ezetimibe, Limonene, Nitrofurantoin, and Sulfa antibiotics   History: Past Medical History:  Diagnosis Date  . Acute CHF (congestive heart failure) ([place]) 02/18/2020  . Acute diastolic CHF (congestive heart failure) ([place])   . Acute kidney injury superimposed on CKD ([place])   . Allergies   . Anxiety   . Arthritis    spine and shoulder  . Atherosclerosis of artery of extremity with rest pain ([place]) 10/12/2018  . Cancer ([place])    skin  . CHF (congestive heart failure) ([place])   . Diabetes mellitus without complication ([place])   . Heart murmur   . Hyperlipidemia   . Hypertension   . Macula lutea degeneration   . Mitral and aortic valve disease   . Myocardial infarction [facility])    may have had a "light" heart attack  . Non-ST elevation (NSTEMI) myocardial infarction ([place])   . Occasional tremors   . PAD (peripheral artery disease) ([place])   . Shingles    patient unaware but daughter confirms. it was a long time ago  . Stroke [facility]) 01/2017   may have had a slight stroke  . TIA (transient ischemic attack) 01/2017  . UTI (urinary tract infection)   . Vascular disease, peripheral  [facility])    Past Surgical History:  Procedure Laterality Date  . CARDIAC CATHETERIZATION  1998   40% LM, 95% Ramus interm  . CATARACT EXTRACTION, BILATERAL    . COLONOSCOPY WITH PROPOFOL N/A 08/12/2018   Procedure: COLONOSCOPY WITH PROPOFOL;  Surgeon: Lucilla Lame, MD;  Location: [facility] ENDOSCOPY;  Service: Endoscopy;  Laterality: N/A;  . ENDARTERECTOMY FEMORAL Left 10/12/2018   Procedure: ENDARTERECTOMY FEMORAL;  Surgeon: Katha Cabal, MD;  Location: [facility] ORS;  Service: Vascular;  Laterality: Left;  angioplasty and left SFA stent placement  . EYE SURGERY Bilateral    cataract extractions  . LOWER EXTREMITY ANGIOGRAPHY Left 08/23/2017   Procedure: Lower Extremity Angiography;  Surgeon: Algernon Huxley, MD;  Location: [place] CV LAB;  Service: Cardiovascular;  Laterality: Left;  . LOWER EXTREMITY ANGIOGRAPHY Left 07/26/2018   Procedure: LOWER EXTREMITY ANGIOGRAPHY;  Surgeon: Katha Cabal, MD;  Location: [place] CV LAB;  Service: Cardiovascular;  Laterality:  Left;  . LOWER EXTREMITY ANGIOGRAPHY Left 09/16/2018   Procedure: LOWER EXTREMITY ANGIOGRAPHY;  Surgeon: Katha Cabal, MD;  Location: [place] CV LAB;  Service: Cardiovascular;  Laterality: Left;  . PTCA  08/2013   Left common iliac  . PTCA  12/2012   left ext iliac  . RIGHT HEART CATH N/A 02/23/2020   Procedure: RIGHT HEART CATH;  Surgeon: Minna Merritts, MD;  Location: [place] CV LAB;  Service: Cardiovascular;  Laterality: N/A;  . TUBAL LIGATION     Family History  Problem Relation Age of Onset  . Dementia Mother   . Diabetes Father    Social History   Socioeconomic History  . Marital status: Married    Spouse name: kermit  . Number of children: 3  . Years of education: Not on file  . Highest education level: 12th grade  Occupational History  . Occupation: Retired    Comment: homemaker  Tobacco Use  . Smoking status: Former Smoker    Packs/day: 2.00    Years: 37.00    Pack years:  74.00    Types: Cigarettes    Quit date: 1980    Years since quitting: 41.8  . Smokeless tobacco: Never Used  . Tobacco comment: smoking cessation materials not required  Vaping Use  . Vaping Use: Never used  Substance and Sexual Activity  . Alcohol use: No    Alcohol/week: 0.0 standard drinks  . Drug use: No  . Sexual activity: Not Currently  Other Topics Concern  . Not on file  Social History Narrative   Husband has dementia and may wander. Requires constant supervision. Pt's daughter also lives with them. Her son Ollen Gross is a Garment/textile technologist.    Social Determinants of Health   Financial Resource Strain: Low Risk   . Difficulty of Paying Living Expenses: Not hard at all  Food Insecurity: No Food Insecurity  . Worried About Charity fundraiser in the Last Year: Never true  . Ran Out of Food in the Last Year: Never true  Transportation Needs: No Transportation Needs  . Lack of Transportation (Medical): No  . Lack of Transportation (Non-Medical): No  Physical Activity: Insufficiently Active  . Days of Exercise per Week: 3 days  . Minutes of Exercise per Session: 10 min  Stress: Stress Concern Present  . Feeling of Stress : To some extent  Social Connections: Socially Integrated  . Frequency of Communication with Friends and Family: More than three times a week  . Frequency of Social Gatherings with Friends and Family: More than three times a week  . Attends Religious Services: More than 4 times per year  . Active Member of Clubs or Organizations: Yes  . Attends Archivist Meetings: More than 4 times per year  . Marital Status: Married    Tobacco Counseling Counseling given: Not Answered Comment: smoking cessation materials not required   Clinical Intake:  Pre-visit preparation completed: Yes  Pain : 0-10 Pain Score: 5  Pain Type: Chronic pain Pain Location: Leg Pain Orientation: Left Pain Descriptors / Indicators: Aching, Sore Pain Onset: More than a month  ago Pain Frequency: Constant     Nutritional Risks: None Diabetes: Yes CBG done?: No Did pt. bring in CBG monitor from home?: No  How often do you need to have someone help you when you read instructions, pamphlets, or other written materials from your doctor or pharmacy?: 1 - Never  Nutrition Risk Assessment:  Has the patient had any  N/V/D within the last 2 months?  No  Does the patient have any non-healing wounds?  No  Has the patient had any unintentional weight loss or weight gain?  No   Diabetes:  Is the patient diabetic?  Yes  If diabetic, was a CBG obtained today?  No  Did the patient bring in their glucometer from home?  No  How often do you monitor your CBG's? 3-4 times per day .   Financial Strains and Diabetes Management:  Are you having any financial strains with the device, your supplies or your medication? No .  Does the patient want to be seen by Chronic Care Management for management of their diabetes?  No  Would the patient like to be referred to a Nutritionist or for Diabetic Management?  No   Diabetic Exams:  Diabetic Eye Exam: Completed 01/11/20.   Diabetic Foot Exam: Completed 01/08/20.   Interpreter Needed?: No  Information entered by :: Clemetine Marker LPN   Activities of Daily Living In your present state of health, do you have any difficulty performing the following activities: 09/04/2020 05/06/2020  Hearing? Tempie Donning  Comment interested in hearing aids -  Vision? Y Y  Difficulty concentrating or making decisions? Tempie Donning  Walking or climbing stairs? Y N  Dressing or bathing? N N  Doing errands, shopping? Y N  Comment does not drive Facilities manager and eating ? N -  Using the Toilet? N -  In the past six months, have you accidently leaked urine? N -  Do you have problems with loss of bowel control? N -  Managing your Medications? N -  Managing your Finances? N -  Housekeeping or managing your Housekeeping? N -  Some recent data might be hidden     Patient Care Team: [name], MD as PCP - General (Internal Medicine) [name], Dolores Lory, MD as Consulting Physician (Vascular Surgery) [name], MD as Consulting Physician (Endocrinology) [name], DPM as Consulting Physician (Podiatry) Corey Skains, MD as Consulting Physician (Cardiology) Jannet Mantis, MD as Consulting Physician (Dermatology) Deetta Perla, MD as Consulting Physician (Neurosurgery) [name], RN as Case Manager (General Practice) Vladimir Faster, [facility] (Pharmacist) Murlean Iba, MD (Nephrology)  Indicate any recent Medical Services you may have received from other than [facility] providers in the past year (date may be approximate).     Assessment:   This is a routine wellness examination for [place].  Hearing/Vision screen  Hearing Screening   _0  _1  _2  _3  _4  _5  _6  _7  _8   Right ear:           Left ear:           Comments: Pt c/o mild hearing difficulty; interested in hearing aids  Vision Screening Comments: Annual vision screenings with Dr. Gaspar Bidding in [place] issues and exercise activities discussed: Current Exercise Habits: Home exercise routine, Type of exercise: walking, Time (Minutes): 10, Frequency (Times/Week): 3, Weekly Exercise (Minutes/Week): 30, Intensity: Mild, Exercise limited by: neurologic condition(s);orthopedic condition(s)  Goals    .  DIET - INCREASE WATER INTAKE      Recommend to drink at least 6-8 8oz glasses of water per day.    Marland Kitchen  PharmD "I want to be healthy as I can"      CARE PLAN ENTRY (see longitudinal plan of care for additional care plan information)  Current Barriers:  . Chronic Disease Management support, education, and care coordination needs related  to Hypertension, Hyperlipidemia, Diabetes, Atrial Fibrillation, Heart Failure, Coronary Artery Disease, GERD, and COPD . Spoke with daughter, Judson Roch. Home health was in home for father and he was  distressed so our visit was abbreviated today.   Hypertension BP Readings from Last 3 Encounters:  08/07/20 106/68  07/20/20 (!) 160/55  05/30/20 (!) 204/66   . Pharmacist Clinical Goal(s): o Over the next 60 days, patient will work with PharmD and providers to achieve BP goal <130/80 . Current regimen:  . Losartan 25 mg bid . Amlodipine 2.5 mg daily . Torsemide 20 mg  prn  . Clonidine 0.1 mg up to bid for SBP> 180 . Interventions: o Reviewed proper technique for checking BP o Encouraged daughter to contact PCP if pre-med readings contiue to be higher than after administration. Recommend she brings cuff to next appointment for comparison/calibration. . Patient self care activities - Over the next 60 days, patient will: o Check BP bid, document, and provide at future appointments o Ensure daily salt intake < 2300 mg/day   Diabetes Lab Results  Component Value Date/Time   HGBA1C 8.2 06/14/2020 12:00 AM   HGBA1C 6.6 (H) 02/20/2020 04:11 AM   HGBA1C 6.3 (H) 02/18/2020 07:58 AM   HGBA1C 6.5 10/16/2019 12:00 AM   . Pharmacist Clinical Goal(s): o Over the next 60 days, patient will work with PharmD and providers to achieve A1c goal <7% . Current regimen:  . Novolog 3 units tid with meals, increase to 4 u if AC BG >250 . basaglar 12 units after lunch . Interventions: o Reviewed inuslin regimen and timing of Novolog administration o Provided dietary counseling o Offered to help set up [facility] and provided my contact number . Patient self care activities - Over the next 60 days, patient will: o Check blood sugar 3-4 times daily, document, and provide at future appointments o Contact provider with any episodes of hypoglycemia o Contact PharmD for assistance setting up [employer] [place]  Medication management . Pharmacist Clinical Goal(s): o Over the next 60 days, patient will work with PharmD and providers to achieve optimal medication adherence . Current pharmacy:  [facility] . Interventions o Comprehensive medication review performed. o Continue current medication management strategy . Patient self care activities - Over the next 60 days, patient will: o Focus on medication adherence by fill dates o Take medications as prescribed o Report any questions or concerns to PharmD and/or provider(s)  Please see past updates related to this goal by clicking on the "Past Updates" button in the selected goal   (see longitudinal plan of care for additional care plan information)  Current Barriers:  . 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 Daughter lives in the home and is very involved in managing health care needs. Patient no longer drives. . Polypharmacy; complex patient with multiple comorbidities including Diabetes, hypertension, CKD, CAD,PVD, HF, GERD, Hyperlipidemia . Daughter, Clarise Cruz, manages medications by pill box. . Most recent eGFR: 14ml/min .   Pharmacist Clinical Goal(s):  Marland Kitchen Over the next60 days, patient will work with PharmD and provider towards optimized medication management  Interventions: . Comprehensive medication review performed; medication list updated in electronic medical record . Inter-disciplinary care team collaboration (see longitudinal plan of care) . Counseled patient to take Novolog with meals instead of 1 hour after. Discussed diet and exercise.   Patient Self Care Activities:  . Patient will take medications as prescribed . Patient will focus on improved adherence by taking Novolog  with meals. . Continue to monitor blood glucose 3 - 4 times daily. Take in the morning before eating (fasting) and ~ 2 hours after meals. . Continue to monitor BP twice daily.  Initial goal documentation     .  RNCM: Pt-"I had to go because i had a bad cough" (pt-stated)      Current Barriers:   Knowledge deficit related to basic COPD self care/management  Knowledge deficit related to basic  understanding of how to use inhalers and how inhaled medications work  Knowledge deficit related to importance of energy conservation   Nurse Case Manager Clinical Goal(s):  Over the next 120 days patient will report using inhalers as prescribed including rinsing mouth after use  Over the next 120 days patient will report utilizing pursed lip breathing for shortness of breath  Over the next 120 days, patient will be able to verbalize understanding of COPD action plan and when to seek appropriate levels of medical care  Over the next 120 days, patient will engage in lite exercise as tolerated to build/regain stamina and strength and reduce shortness of breath through activity tolerance   Interventions:   Provided patient with basic written and verbal COPD education on self care/management/and exacerbation prevention   Provided patient with COPD action plan and reinforced importance of daily self assessment  Evaluation of COPD exacerbation/Conditions. The patient states that she is doing well. Denies any acute distress related to her COPD. Feels it is stable at this time. Is compliant with medications.   Provided written and verbal instructions on pursed lip breathing and utilized returned demonstration as teach back  Evaluation of appointment with pcp. The patient has an appointment with pcp on 03-20-2021. The patient has several specialist appointments coming up.  Patient Self Care Activities:   Take medications as prescribed including inhalers  Practice and use pursed lip breathing for shortness of breath recovery and prevention  Self assess COPD action plan zone and make appointment with provider if you have been in the yellow zone for 48 hours without improvement.  Engage in lite exercise as tolerated 3-5 days a week  Utilize infection prevention strategies to reduce risk of respiratory infection       .  RNCM: Pt-"I have to watch my weight closely" (pt-stated)      CARE PLAN  ENTRY (see longitudinal plan of care for additional care plan information)  Current Barriers:  Marland Kitchen Knowledge deficit related to basic heart failure pathophysiology and self care management . Post discharge from the hospital on 02-26-2020 for patient stated "too much water in me"   Nurse Case Manager Clinical Goal(s):   Over the next 120 days, patient will weigh self daily and record  Over the next 120 days, patient will verbalize understanding of Heart Failure Action Plan and when to call doctor  Over the next 120 days, patient will take all Heart Failure mediations as prescribed  Interventions:  . Basic overview and discussion of pathophysiology of Heart Failure . Provided written and verbal education on low sodium diet.  Will send information by My Chart and EMMI about HF and how to prevent exacerbation. The patient endorses a heart healthy/ADA diet.  She is sad that she can not eat any more hot dogs; however she was able to tell the RNCM healthy food choices to maintain her health and well being with HF/HTN/HLD/DM.  Review how eating salt causes the water to follow in the body. The patient reported cough and swelling in her  left leg. 08-22-2020: The patient is doing well and says her ankles are "slim".  Sees the cardiologist in the coming months. Denies any issues with edema at this time. Will continue to monitor.  . Reviewed Heart Failure Action Plan in depth.  The patient verbalized she is to call her provider if her weight is less than 113 or greater than 116.  She had follow up with the cardiologist yesterday. She said the cardiology is pleased with her post hospital evaluation. The patient is also taking her blood pressure daily.  07-05-2020: The patients  weight today was 112. Education provided on calling the provider for weight gain of >3 pounds in one day or >5 in one week. She is being mindful of water weight. 08-22-2020: The patients weight in the office recently was 119.  The patient had  clothes on. The patient denies any acute distress.  . Assessed for scales in home.  Patient endorses working scales . Discussed importance of daily weight.  Education on weighing first thing every am after first void, with similar clothes for most accurate weight.  Have scales on flat surface.  Call the provider for outside given parameters of wt <113 or >116. Education given to patient on  the importance of weighing daily so subtle changes can be picked up on and intervention made if needed by the provider. 07-05-2020: The patient endorses weighing daily and most of the time BID.   Marland Kitchen Reviewed role of diuretics in prevention of fluid overload.  Education on taking medications as directed. Reviewed safety when taking diuretics.    Patient Self Care Activities:  . Take Heart Failure Medications as prescribed . Weigh daily and record (notify MD with 3 lb weight gain over night or 5 lb in a week) Per the patient the MD wants her weight range to be 113-116. . Follow CHF Action Plan . Adhere to low sodium diet  Please see past updates related to this goal by clicking on the "Past Updates" button in the selected goal      .  RNCM: Pt-"My blood pressure was high this am so I called the doctor"      [place] (see longtitudinal plan of care for additional care plan information)  Current Barriers:  . Chronic Disease Management support, education, and care coordination needs related to HTN, HLD, DMII, and CKD Stage 3  Clinical Goal(s) related to HTN, HLD, DMII, and CKD Stage 3 :  Over the next 120 days, patient will:  . Work with the care management team to address educational, disease management, and care coordination needs  . Begin or continue self health monitoring activities as directed today Measure and record cbg (blood glucose) 3 times daily, Measure and record blood pressure 5 times per week, and adhere to a Heart healthy/ADA diet . Call provider office for new or worsened signs and  symptoms Blood glucose findings outside established parameters, Blood pressure findings outside established parameters, and New or worsened symptom related to CKD3 and HLD . Call care management team with questions or concerns . Verbalize basic understanding of patient centered plan of care established today  Interventions related to HTN, HLD, DMII, and CKD Stage 3 :  . Evaluation of current treatment plans and patient's adherence to plan as established by provider.  08-22-2020: the patient states she has been having elevations in her blood pressure. She has a prn medication to take with parameters. This am her systolic pressure was 322 and she could  not remember the bottom part. The patient had a slight headache.  The patient says that she has been having up and down blood pressures. Stress does cause it to go higher.   . Assessed patient understanding of disease states.  The patient verbalized understanding of chronic conditions and endorses compliance with the plan of care . Assessed patient's education and care coordination needs. . Review of blood sugar readings. 07-05-2020: The patient verbalized fast this am was 109. Education on fasting blood sugars as <130 and post pradial <180.  The patients last hemoglobin A1C recorded was 6.6 but she states at the [facility] it is over 8.0.  she has upcoming appointment with the endocrinologist. Discussed the role of steroids causing blood sugars to be elevated. The patient verbalized understanding.   . Review of blood pressure this am and her reading was 125/45- denies any issues with orthostatic hypotension at this time. Reviewed safety when ambulating. 08-22-2020: The patient states her blood pressures have been up and down. The patient says the systolic is usually 883-374 but has been as high as 190, with it being 175 this am. States the bottom number is usually 50-60. States they may have to adjust her medications for her blood pressures. The patient states that she  was trying to determine what she did yesterday to make it so high. Education on managing stress and anxiety. She says sometimes it is elevated because of her husband and his illness and how it impacts her.  . Provided disease specific education to patient- review of Heart healthy/ADA diet, the patient states she has a good appetite just does not have good taste except for sweets. Education and support. [name] with appropriate clinical care team members regarding patient needs.  Denies needs from the CCM LCSW at this time. Is currently working with the CCM pharmacist.  . Evaluation of leg pain.  The patient states that she is having leg pain from a fall. Working with neurologist. They are trying to figure out a plan to best help her. The patient does not want to do surgery. Will discuss with neurologist. Denies any new falls. Review of safety. 08-22-2020: the patient denies any new falls. Is utilizing her walker and doing well.  Patient Self Care Activities related to HTN, HLD, DMII, and CKD Stage 3 :  . Patient is unable to independently self-manage chronic health conditions  Please see past updates related to this goal by clicking on the "Past Updates" button in the selected goal        Depression Screen PHQ 2/9 Scores 09/04/2020 09/04/2020 08/07/2020 05/02/2020 03/18/2020 01/08/2020 01/01/2020  PHQ - 2 Score 0 0 0 1 2 0 2  PHQ- 9 Score - - _0 0 3    Fall Risk Fall Risk  09/04/2020 08/07/2020 05/06/2020 05/02/2020 03/18/2020  Falls in the past year? _1 0  Number falls in past yr: 0 0 0 0 0  Injury with Fall? 0 0 1 0 0  Comment - - - - -  Risk Factor Category  - - - - -  Risk for fall due to : History of fall(s) - History of fall(s) - Impaired balance/gait;History of fall(s)  Risk for fall due to: Comment - - - - -  Follow up - Falls evaluation completed Falls evaluation completed Falls evaluation completed Falls evaluation completed    Any stairs in or around the home? Yes   If so, are there any without  handrails? No  Home free of loose throw rugs in walkways, pet beds, electrical cords, etc? Yes  Adequate lighting in your home to reduce risk of falls? Yes   ASSISTIVE DEVICES UTILIZED TO PREVENT FALLS:  Life alert? Yes  Use of a cane, walker or w/c? Yes  Grab bars in the bathroom? Yes  Shower chair or bench in shower? Yes  Elevated toilet seat or a handicapped toilet? Yes   TIMED UP AND GO:  Was the test performed? No . Telephonic visit  Cognitive Function:     6CIT Screen 09/04/2020 06/21/2019 06/15/2018 06/02/2017  What Year? 0 points 0 points 0 points 0 points  What month? 0 points 0 points 0 points 0 points  What time? 0 points 0 points 0 points 0 points  Count back from 20 0 points 0 points 0 points 0 points  Months in reverse 0 points 0 points 0 points 0 points  Repeat phrase 8 points 4 points 2 points 0 points  Total Score _0 0    Immunizations Immunization History  Administered Date(s) Administered  . Fluad Quad(high Dose 65+) 09/14/2019, 08/07/2020  . Influenza, High Dose Seasonal PF 08/17/2017  . Influenza, Seasonal, Injecte, Preservative Fre 09/04/2005, 08/10/2007, 08/27/2010  . Influenza,inj,Quad PF,6+ Mos 08/08/2014, 09/16/2015, 09/23/2016, 07/22/2018  . Influenza,inj,quad, With Preservative 08/16/2013, 09/14/2019  . Pneumococcal Conjugate-13 09/10/2014  . Pneumococcal Polysaccharide-23 10/29/2003  . Pneumococcal-Unspecified 09/04/2010  . Tdap 10/26/2008  . Varicella 05/07/2011  . Zoster 05/07/2011    TDAP status: Due, Education has been provided regarding the importance of this vaccine. Advised may receive this vaccine at local pharmacy or Health Dept. Aware to provide a copy of the vaccination record if obtained from local pharmacy or Health Dept. Verbalized acceptance and understanding.    Flu Vaccine status: Up to date   Pneumococcal vaccine status: Up to date   Covid-19 vaccine status: Completed vaccines. Pt advised  to bring vaccination record to next appt  Qualifies for Shingles Vaccine? Yes   Zostavax completed No   Shingrix Completed?: No.    Education has been provided regarding the importance of this vaccine. Patient has been advised to call insurance company to determine out of pocket expense if they have not yet received this vaccine. Advised may also receive vaccine at local pharmacy or Health Dept. Verbalized acceptance and understanding.  Screening Tests Health Maintenance  Topic Date Due  . COVID-19 Vaccine (1) Never done  . MAMMOGRAM  10/11/2018  . TETANUS/TDAP  01/07/2021 (Originally 10/26/2018)  . HEMOGLOBIN A1C  12/15/2020  . FOOT EXAM  01/07/2021  . OPHTHALMOLOGY EXAM  01/10/2021  . INFLUENZA VACCINE  Completed  . DEXA SCAN  Completed  . PNA vac Low Risk Adult  Completed    Health Maintenance  Health Maintenance Due  Topic Date Due  . COVID-19 Vaccine (1) Never done  . MAMMOGRAM  10/11/2018    Colorectal cancer screening: No longer required.    Mammogram status: Completed 10/11/17. Repeat every year. Ordered 03/18/20  Bone Density status: Completed 07/06/18. Results reflect: Bone density results: OSTEOPENIA. Repeat every 2 years.  Lung Cancer Screening: (Low Dose CT Chest recommended if Age 106-80 years, 30 pack-year currently smoking OR have quit w/in 15years.) does not qualify.     Additional Screening:  Hepatitis C Screening: does not qualify;   Vision Screening: Recommended annual ophthalmology exams for early detection of glaucoma and other disorders of the eye. Is the patient up to date with their annual eye  exam?  Yes  Who is the provider or what is the name of the office in which the patient attends annual eye exams? Dr. Gaspar Bidding  Dental Screening: Recommended annual dental exams for proper oral hygiene  Community Resource Referral / Chronic Care Management: CRR required this visit?  No   CCM required this visit?  No      Plan:     I have personally  reviewed and noted the following in the patient's chart:   . Medical and social history . Use of alcohol, tobacco or illicit drugs  . Current medications and supplements . Functional ability and status . Nutritional status . Physical activity . Advanced directives . List of other physicians . Hospitalizations, surgeries, and ER visits in previous 12 months . Vitals . Screenings to include cognitive, depression, and falls . Referrals and appointments  In addition, I have reviewed and discussed with patient certain preventive protocols, quality metrics, and best practice recommendations. A written personalized care plan for preventive services as well as general preventive health recommendations were provided to patient.     Clemetine Marker, LPN   [id number]   Nurse Notes: none

## 2020-09-04 NOTE — Patient Instructions (Signed)
Jill Hudson , Thank you for taking time to come for your Medicare Wellness Visit. I appreciate your ongoing commitment to your health goals. Please review the following plan we discussed and let me know if I can assist you in the future.   Screening recommendations/referrals: Colonoscopy: no longer required Mammogram: done 10/11/17. Please call [phone] to schedule your mammogram. Bone Density: done 07/06/18 Recommended yearly ophthalmology/optometry visit for glaucoma screening and checkup Recommended yearly dental visit for hygiene and checkup  Vaccinations: Influenza vaccine: done 08/07/20 Pneumococcal vaccine: done 09/10/14 Tdap vaccine: due Shingles vaccine: Shingrix discussed. Please contact your pharmacy for coverage information.  Covid-19: please bring your vaccination record with you to your next appointment.  Advanced directives: Please bring a copy of your health care power of attorney and living will to the office at your convenience.  Conditions/risks identified: Recommend continuing to prevent falls  Next appointment: Follow up in one year for your annual wellness visit    Preventive Care 65 Years and Older, Female Preventive care refers to lifestyle choices and visits with your health care provider that can promote health and wellness. What does preventive care include?  A yearly physical exam. This is also called an annual well check.  Dental exams once or twice a year.  Routine eye exams. Ask your health care provider how often you should have your eyes checked.  Personal lifestyle choices, including:  Daily care of your teeth and gums.  Regular physical activity.  Eating a healthy diet.  Avoiding tobacco and drug use.  Limiting alcohol use.  Practicing safe sex.  Taking low-dose aspirin every day.  Taking vitamin and mineral supplements as recommended by your health care provider. What happens during an annual well check? The services and  screenings done by your health care provider during your annual well check will depend on your age, overall health, lifestyle risk factors, and family history of disease. Counseling  Your health care provider may ask you questions about your:  Alcohol use.  Tobacco use.  Drug use.  Emotional well-being.  Home and relationship well-being.  Sexual activity.  Eating habits.  History of falls.  Memory and ability to understand (cognition).  Work and work Statistician.  Reproductive health. Screening  You may have the following tests or measurements:  Height, weight, and BMI.  Blood pressure.  Lipid and cholesterol levels. These may be checked every 5 years, or more frequently if you are over 67 years old.  Skin check.  Lung cancer screening. You may have this screening every year starting at age 68 if you have a 30-pack-year history of smoking and currently smoke or have quit within the past 15 years.  Fecal occult blood test (FOBT) of the stool. You may have this test every year starting at age 20.  Flexible sigmoidoscopy or colonoscopy. You may have a sigmoidoscopy every 5 years or a colonoscopy every 10 years starting at age 69.  Hepatitis C blood test.  Hepatitis B blood test.  Sexually transmitted disease (STD) testing.  Diabetes screening. This is done by checking your blood sugar (glucose) after you have not eaten for a while (fasting). You may have this done every 1-3 years.  Bone density scan. This is done to screen for osteoporosis. You may have this done starting at age 55.  Mammogram. This may be done every 1-2 years. Talk to your health care provider about how often you should have regular mammograms. Talk with your health care provider about your test results,  treatment options, and if necessary, the need for more tests. Vaccines  Your health care provider may recommend certain vaccines, such as:  Influenza vaccine. This is recommended every  year.  Tetanus, diphtheria, and acellular pertussis (Tdap, Td) vaccine. You may need a Td booster every 10 years.  Zoster vaccine. You may need this after age 69.  Pneumococcal 13-valent conjugate (PCV13) vaccine. One dose is recommended after age 27.  Pneumococcal polysaccharide (PPSV23) vaccine. One dose is recommended after age 59. Talk to your health care provider about which screenings and vaccines you need and how often you need them. This information is not intended to replace advice given to you by your health care provider. Make sure you discuss any questions you have with your health care provider. Document Released: 11/08/2015 Document Revised: 07/01/2016 Document Reviewed: 08/13/2015 Elsevier Interactive Patient Education  2017 [place] Prevention in the Home Falls can cause injuries. They can happen to people of all ages. There are many things you can do to make your home safe and to help prevent falls. What can I do on the outside of my home?  Regularly fix the edges of walkways and driveways and fix any cracks.  Remove anything that might make you trip as you walk through a door, such as a raised step or threshold.  Trim any bushes or trees on the path to your home.  Use bright outdoor lighting.  Clear any walking paths of anything that might make someone trip, such as rocks or tools.  Regularly check to see if handrails are loose or broken. Make sure that both sides of any steps have handrails.  Any raised decks and porches should have guardrails on the edges.  Have any leaves, snow, or ice cleared regularly.  Use sand or salt on walking paths during winter.  Clean up any spills in your garage right away. This includes oil or grease spills. What can I do in the bathroom?  Use night lights.  Install grab bars by the toilet and in the tub and shower. Do not use towel bars as grab bars.  Use non-skid mats or decals in the tub or shower.  If you  need to sit down in the shower, use a plastic, non-slip stool.  Keep the floor dry. Clean up any water that spills on the floor as soon as it happens.  Remove soap buildup in the tub or shower regularly.  Attach bath mats securely with double-sided non-slip rug tape.  Do not have throw rugs and other things on the floor that can make you trip. What can I do in the bedroom?  Use night lights.  Make sure that you have a light by your bed that is easy to reach.  Do not use any sheets or blankets that are too big for your bed. They should not hang down onto the floor.  Have a firm chair that has side arms. You can use this for support while you get dressed.  Do not have throw rugs and other things on the floor that can make you trip. What can I do in the kitchen?  Clean up any spills right away.  Avoid walking on wet floors.  Keep items that you use a lot in easy-to-reach places.  If you need to reach something above you, use a strong step stool that has a grab bar.  Keep electrical cords out of the way.  Do not use floor polish or wax that makes floors  slippery. If you must use wax, use non-skid floor wax.  Do not have throw rugs and other things on the floor that can make you trip. What can I do with my stairs?  Do not leave any items on the stairs.  Make sure that there are handrails on both sides of the stairs and use them. Fix handrails that are broken or loose. Make sure that handrails are as long as the stairways.  Check any carpeting to make sure that it is firmly attached to the stairs. Fix any carpet that is loose or worn.  Avoid having throw rugs at the top or bottom of the stairs. If you do have throw rugs, attach them to the floor with carpet tape.  Make sure that you have a light switch at the top of the stairs and the bottom of the stairs. If you do not have them, ask someone to add them for you. What else can I do to help prevent falls?  Wear shoes  that:  Do not have high heels.  Have rubber bottoms.  Are comfortable and fit you well.  Are closed at the toe. Do not wear sandals.  If you use a stepladder:  Make sure that it is fully opened. Do not climb a closed stepladder.  Make sure that both sides of the stepladder are locked into place.  Ask someone to hold it for you, if possible.  Clearly mark and make sure that you can see:  Any grab bars or handrails.  First and last steps.  Where the edge of each step is.  Use tools that help you move around (mobility aids) if they are needed. These include:  Canes.  Walkers.  Scooters.  Crutches.  Turn on the lights when you go into a dark area. Replace any light bulbs as soon as they burn out.  Set up your furniture so you have a clear path. Avoid moving your furniture around.  If any of your floors are uneven, fix them.  If there are any pets around you, be aware of where they are.  Review your medicines with your doctor. Some medicines can make you feel dizzy. This can increase your chance of falling. Ask your doctor what other things that you can do to help prevent falls. This information is not intended to replace advice given to you by your health care provider. Make sure you discuss any questions you have with your health care provider. Document Released: 08/08/2009 Document Revised: 03/19/2016 Document Reviewed: 11/16/2014 Elsevier Interactive Patient Education  2017 [employer].

## 2020-09-09 DIAGNOSIS — H353211 Exudative age-related macular degeneration, right eye, with active choroidal neovascularization: Secondary | ICD-10-CM | POA: Diagnosis not present

## 2020-09-09 DIAGNOSIS — E119 Type 2 diabetes mellitus without complications: Secondary | ICD-10-CM | POA: Diagnosis not present

## 2020-09-09 DIAGNOSIS — H353221 Exudative age-related macular degeneration, left eye, with active choroidal neovascularization: Secondary | ICD-10-CM | POA: Diagnosis not present

## 2020-09-16 DIAGNOSIS — H353221 Exudative age-related macular degeneration, left eye, with active choroidal neovascularization: Secondary | ICD-10-CM | POA: Diagnosis not present

## 2020-09-16 DIAGNOSIS — E133311 Other specified diabetes mellitus with moderate nonproliferative diabetic retinopathy with macular edema, right eye: Secondary | ICD-10-CM | POA: Diagnosis not present

## 2020-09-16 DIAGNOSIS — Z961 Presence of intraocular lens: Secondary | ICD-10-CM | POA: Diagnosis not present

## 2020-09-22 ENCOUNTER — Other Ambulatory Visit: Payer: Self-pay | Admitting: Internal Medicine

## 2020-09-22 DIAGNOSIS — E118 Type 2 diabetes mellitus with unspecified complications: Secondary | ICD-10-CM

## 2020-09-30 DIAGNOSIS — E1159 Type 2 diabetes mellitus with other circulatory complications: Secondary | ICD-10-CM | POA: Diagnosis not present

## 2020-09-30 DIAGNOSIS — E1169 Type 2 diabetes mellitus with other specified complication: Secondary | ICD-10-CM | POA: Diagnosis not present

## 2020-09-30 DIAGNOSIS — E1142 Type 2 diabetes mellitus with diabetic polyneuropathy: Secondary | ICD-10-CM | POA: Diagnosis not present

## 2020-09-30 DIAGNOSIS — Z794 Long term (current) use of insulin: Secondary | ICD-10-CM | POA: Diagnosis not present

## 2020-09-30 DIAGNOSIS — E785 Hyperlipidemia, unspecified: Secondary | ICD-10-CM | POA: Diagnosis not present

## 2020-09-30 DIAGNOSIS — I152 Hypertension secondary to endocrine disorders: Secondary | ICD-10-CM | POA: Diagnosis not present

## 2020-09-30 LAB — HEMOGLOBIN A1C: Hemoglobin A1C: 7.7

## 2020-10-07 DIAGNOSIS — N1832 Chronic kidney disease, stage 3b: Secondary | ICD-10-CM | POA: Diagnosis not present

## 2020-10-07 DIAGNOSIS — I10 Essential (primary) hypertension: Secondary | ICD-10-CM | POA: Diagnosis not present

## 2020-10-07 DIAGNOSIS — R8290 Unspecified abnormal findings in urine: Secondary | ICD-10-CM | POA: Diagnosis not present

## 2020-10-07 DIAGNOSIS — E1122 Type 2 diabetes mellitus with diabetic chronic kidney disease: Secondary | ICD-10-CM | POA: Diagnosis not present

## 2020-10-07 DIAGNOSIS — R600 Localized edema: Secondary | ICD-10-CM | POA: Diagnosis not present

## 2020-10-07 DIAGNOSIS — E875 Hyperkalemia: Secondary | ICD-10-CM | POA: Diagnosis not present

## 2020-10-08 ENCOUNTER — Ambulatory Visit (INDEPENDENT_AMBULATORY_CARE_PROVIDER_SITE_OTHER): Payer: Medicare Other | Admitting: Internal Medicine

## 2020-10-08 ENCOUNTER — Encounter: Payer: Self-pay | Admitting: Internal Medicine

## 2020-10-08 ENCOUNTER — Other Ambulatory Visit: Payer: Self-pay

## 2020-10-08 VITALS — BP 132/40 | HR 61 | Temp 98.1°F | Ht 60.0 in | Wt 120.0 lb

## 2020-10-08 DIAGNOSIS — E7800 Pure hypercholesterolemia, unspecified: Secondary | ICD-10-CM | POA: Diagnosis not present

## 2020-10-08 DIAGNOSIS — J449 Chronic obstructive pulmonary disease, unspecified: Secondary | ICD-10-CM

## 2020-10-08 DIAGNOSIS — I110 Hypertensive heart disease with heart failure: Secondary | ICD-10-CM | POA: Diagnosis not present

## 2020-10-08 DIAGNOSIS — I5032 Chronic diastolic (congestive) heart failure: Secondary | ICD-10-CM | POA: Diagnosis not present

## 2020-10-08 DIAGNOSIS — M7061 Trochanteric bursitis, right hip: Secondary | ICD-10-CM | POA: Diagnosis not present

## 2020-10-08 DIAGNOSIS — I2510 Atherosclerotic heart disease of native coronary artery without angina pectoris: Secondary | ICD-10-CM | POA: Diagnosis not present

## 2020-10-08 DIAGNOSIS — I214 Non-ST elevation (NSTEMI) myocardial infarction: Secondary | ICD-10-CM | POA: Diagnosis not present

## 2020-10-08 DIAGNOSIS — I480 Paroxysmal atrial fibrillation: Secondary | ICD-10-CM | POA: Diagnosis not present

## 2020-10-08 MED ORDER — PROMETHAZINE-DM 6.25-15 MG/5ML PO SYRP: 5.0000 mL | ORAL_SOLUTION | Freq: Four times a day (QID) | ORAL | 0 refills | Status: DC | PRN

## 2020-10-08 MED ORDER — PREDNISONE 10 MG PO TABS: 10.0000 mg | ORAL_TABLET | ORAL | 0 refills | Status: AC

## 2020-10-08 NOTE — Progress Notes (Signed)
Date:  10/08/2020   Name:  Jill Hudson   DOB:  1935/08/06   MRN:  [id number]   Chief Complaint: Back Pain (Seen kidney Dr. Deborha Payment did UA and it was fine. Said pt needs xray to have PCP order it. )  Hip Pain  There was no injury mechanism. The pain is present in the right hip. The quality of the pain is described as aching and shooting. The pain is moderate. The pain has been fluctuating since onset. Associated symptoms include a loss of motion. Pertinent negatives include no numbness or tingling. The symptoms are aggravated by movement and palpation. She has tried acetaminophen and ice (Icy hot) for the symptoms. The treatment provided mild relief.    Lab Results  Component Value Date   CREATININE 1.42 (H) 07/20/2020   BUN 44 (H) 07/20/2020   NA 136 07/20/2020   K 4.0 07/20/2020   CL 99 07/20/2020   CO2 25 07/20/2020   Lab Results  Component Value Date   CHOL 117 02/19/2020   HDL 56 02/19/2020   LDLCALC 54 02/19/2020   TRIG 34 02/19/2020   CHOLHDL 2.1 02/19/2020   Lab Results  Component Value Date   TSH 3.512 12/25/2019   Lab Results  Component Value Date   HGBA1C 8.2 06/14/2020   Lab Results  Component Value Date   WBC 8.1 07/20/2020   HGB 12.5 07/20/2020   HCT 36.9 07/20/2020   MCV 90.4 07/20/2020   PLT 228 07/20/2020   Lab Results  Component Value Date   ALT 50 (H) 02/21/2020   AST 33 02/21/2020   ALKPHOS 78 02/21/2020   BILITOT 0.7 02/21/2020     Review of Systems  Constitutional: Negative for chills, fatigue and fever.  Respiratory: Negative for chest tightness.   Cardiovascular: Negative for chest pain, palpitations and leg swelling.  Gastrointestinal: Negative for blood in stool, constipation and diarrhea.  Genitourinary: Negative for dysuria and hematuria.  Musculoskeletal: Positive for arthralgias, back pain and gait problem.  Neurological: Negative for tingling and numbness.    Patient Active Problem List   Diagnosis Date Noted  . Mood  disorder ([place]) 05/02/2020  . Chronic heart failure with preserved ejection fraction ([place]) 04/12/2020  . Diverticulosis of large intestine without perforation or abscess with bleeding 03/27/2020  . AF (paroxysmal atrial fibrillation) ([place])   . Thrush   . COPD (chronic obstructive pulmonary disease) with chronic bronchitis ([place])   . CKD (chronic kidney disease), stage IIIa 02/18/2020  . Malnutrition of mild degree ([place]) 01/08/2020  . Acute blood loss anemia   . Diverticulosis of large intestine with hemorrhage   . GI bleeding 12/25/2019  . Rectal bleeding   . Age-related macular degeneration, dry, left eye 06/21/2019  . Age-related macular degeneration, wet, right eye ([place]) 06/21/2019  . Moderate nonproliferative diabetic retinopathy associated with type 2 diabetes mellitus ([place]) 06/21/2019  . Lumbosacral radiculopathy at L4 05/01/2019  . Underweight 05/01/2019  . Encounter for long-term (current) use of aspirin 10/31/2018  . Encounter for long-term (current) use of antiplatelets/antithrombotics 10/31/2018  . Long term current use of oral hypoglycemic drug 10/31/2018  . Encounter for long-term (current) use of insulin ([place]) 10/31/2018  . GIB (gastrointestinal bleeding) 08/11/2018  . Leg pain 07/03/2017  . Carpal tunnel syndrome on both sides 05/05/2017  . Myalgia due to HMG CoA reductase inhibitor 05/05/2017  . Adverse effect of antihyperlipidemic and antiarteriosclerotic drugs, initial encounter 05/05/2017  . History of CVA (cerebrovascular accident) 02/15/2017  .  Degenerative disc disease, lumbar 12/21/2016  . Atherosclerosis of native arteries of extremity with intermittent claudication ([place]) 12/20/2016  . Bilateral carotid artery stenosis 12/20/2016  . Occlusion and stenosis of bilateral carotid arteries 12/20/2016  . Hip bursitis 05/18/2016  . Elevated TSH 01/18/2016  . CKD stage 3 due to type 2 diabetes mellitus ([place]) 01/16/2016  . Senile ecchymosis 01/16/2016  . Type II  diabetes mellitus with renal manifestations ([place]) 09/16/2015  . GERD (gastroesophageal reflux disease) 07/24/2015  . PVD (peripheral vascular disease) ([place]) 05/17/2015  . Neoplasm of uncertain behavior of skin 05/17/2015  . Retinopathy, diabetic, proliferative ([place]) 04/11/2015  . Hyperlipidemia 04/11/2015  . Essential hypertension 04/11/2015  . Generalized OA 04/11/2015  . Proliferative diabetic retinopathy(362.02) 04/11/2015  . Arteriosclerosis of coronary artery 05/29/2013  . Hypertensive heart disease without CHF 05/29/2013    Allergies  Allergen Reactions  . Saxagliptin Diarrhea  . Epinephrine Other (See Comments)    Patient does not remember what happens when she uses this  . Atorvastatin Other (See Comments)    Muscle aches  . Codeine Other (See Comments)    Upset stomach  . Ezetimibe Other (See Comments)    Myalgias(ZETIA)  . Limonene Rash    Patient does not recall this reaction  . Nitrofurantoin Rash and Other (See Comments)    Pruitus  . Sulfa Antibiotics Rash and Other (See Comments)    Sore mouth     Past Surgical History:  Procedure Laterality Date  . CARDIAC CATHETERIZATION  1998   40% LM, 95% Ramus interm  . CATARACT EXTRACTION, BILATERAL    . COLONOSCOPY WITH PROPOFOL N/A 08/12/2018   Procedure: COLONOSCOPY WITH PROPOFOL;  Surgeon: Lucilla Lame, MD;  Location: [facility] ENDOSCOPY;  Service: Endoscopy;  Laterality: N/A;  . ENDARTERECTOMY FEMORAL Left 10/12/2018   Procedure: ENDARTERECTOMY FEMORAL;  Surgeon: Katha Cabal, MD;  Location: [facility] ORS;  Service: Vascular;  Laterality: Left;  angioplasty and left SFA stent placement  . EYE SURGERY Bilateral    cataract extractions  . LOWER EXTREMITY ANGIOGRAPHY Left 08/23/2017   Procedure: Lower Extremity Angiography;  Surgeon: Algernon Huxley, MD;  Location: [place] [facility];  Service: Cardiovascular;  Laterality: Left;  . LOWER EXTREMITY ANGIOGRAPHY Left 07/26/2018   Procedure: LOWER EXTREMITY ANGIOGRAPHY;   Surgeon: Katha Cabal, MD;  Location: [place] [facility];  Service: Cardiovascular;  Laterality: Left;  . LOWER EXTREMITY ANGIOGRAPHY Left 09/16/2018   Procedure: LOWER EXTREMITY ANGIOGRAPHY;  Surgeon: Katha Cabal, MD;  Location: [place] [facility];  Service: Cardiovascular;  Laterality: Left;  . PTCA  08/2013   Left common iliac  . PTCA  12/2012   left ext iliac  . RIGHT HEART CATH N/A 02/23/2020   Procedure: RIGHT HEART CATH;  Surgeon: Minna Merritts, MD;  Location: [place] [facility];  Service: Cardiovascular;  Laterality: N/A;  . TUBAL LIGATION      Social History   Tobacco Use  . Smoking status: Former Smoker    Packs/day: 2.00    Years: 37.00    Pack years: 74.00    Types: Cigarettes    Quit date: 1980    Years since quitting: 41.9  . Smokeless tobacco: Never Used  . Tobacco comment: smoking cessation materials not required  Vaping Use  . Vaping Use: Never used  Substance Use Topics  . Alcohol use: No    Alcohol/week: 0.0 standard drinks  . Drug use: No     Medication list has been reviewed and  updated.  Current Meds  Medication Sig  . acetaminophen (TYLENOL) 650 MG CR tablet Take 1,300 mg by mouth every 8 (eight) hours as needed for pain.  Marland Kitchen albuterol (VENTOLIN HFA) 108 (90 Base) MCG/ACT inhaler Inhale 2 puffs into the lungs every 6 (six) hours as needed for wheezing or shortness of breath.  Marland Kitchen amLODipine (NORVASC) 5 MG tablet Take 5 mg by mouth daily.  Marland Kitchen apixaban (ELIQUIS) 2.5 MG TABS tablet Take 1 tablet (2.5 mg total) by mouth 2 (two) times daily.  . B-D ULTRAFINE III SHORT PEN 31G X 8 MM MISC USE AS DIRECTED  . cloNIDine (CATAPRES) 0.2 MG tablet Take 1 tablet (0.2 mg total) by mouth 2 (two) times daily. Please check blood pressure prior to taking medication.  Only take if blood pressure is greater than 650 systolic. (Patient taking differently: Take 0.2 mg by mouth as needed. Please check blood pressure prior to taking medication.  Only take if  blood pressure is greater than 354 systolic.)  . Cyanocobalamin 1000 MCG TBCR Take 1,000 mcg by mouth daily.   . feeding supplement, GLUCERNA SHAKE, (GLUCERNA SHAKE) LIQD Take 237 mLs by mouth 3 (three) times daily between meals.  . ferrous sulfate 325 (65 FE) MG tablet Take 325 mg by mouth daily with breakfast.  . gabapentin (NEURONTIN) 100 MG capsule Take 1 capsule (100 mg total) by mouth at bedtime. (Patient taking differently: Take 100 mg by mouth at bedtime.)  . insulin aspart (NOVOLOG FLEXPEN) 100 UNIT/ML FlexPen Inject 3 Units into the skin 3 (three) times daily with meals. At lunch (Patient taking differently: Inject 20 Units into the skin daily. At breakfast- sliding scale)  . Insulin Glargine (BASAGLAR KWIKPEN) 100 UNIT/ML INJECT 12 UNITS UNDER THE SKIN DAILY  . losartan (COZAAR) 25 MG tablet Take 25 mg by mouth daily. Unsure of dosage  . melatonin 5 MG TABS Take 0.5 tablets (2.5 mg total) by mouth at bedtime as needed (sleep).  . Multiple Vitamins-Minerals (PRESERVISION AREDS PO) Take 1 capsule by mouth 2 (two) times daily.   . rosuvastatin (CRESTOR) 5 MG tablet Take 5 mg by mouth every other day. Mon, Wed, Fri and Sat.  . torsemide (DEMADEX) 20 MG tablet Take 2 tablets (40 mg total) by mouth daily. (Patient taking differently: Take 40 mg by mouth as needed.)  . [DISCONTINUED] promethazine-dextromethorphan (PROMETHAZINE-DM) 6.25-15 MG/5ML syrup Take 5 mLs by mouth 4 (four) times daily as needed for cough.    PHQ 2/9 Scores 10/08/2020 09/04/2020 09/04/2020 08/07/2020  PHQ - 2 Score 3 0 0 0  PHQ- 9 Score 9 - - 2    GAD 7 : Generalized Anxiety Score 10/08/2020 08/07/2020 05/06/2020 05/02/2020  Nervous, Anxious, on Edge 2 0 0 1  Control/stop worrying 2 0 0 1  Worry too much - different things 2 0 0 1  Trouble relaxing 0 0 0 1  Restless 0 0 0 0  Easily annoyed or irritable 0 2 0 2  Afraid - awful might happen 0 0 0 0  Total GAD 7 Score 6 2 0 6  Anxiety Difficulty - Not difficult at  all Not difficult at all Not difficult at all    BP Readings from Last 3 Encounters:  10/08/20 (!) 132/40  08/07/20 106/68  07/20/20 (!) 160/55    Physical Exam Constitutional:      Appearance: Normal appearance.  Cardiovascular:     Rate and Rhythm: Normal rate and regular rhythm.  Pulmonary:     Effort:  Pulmonary effort is normal.     Breath sounds: No wheezing or rhonchi.  Musculoskeletal:     Cervical back: Normal range of motion.     Right hip: Tenderness and bony tenderness present. Decreased range of motion.     Left hip: Decreased range of motion.  Lymphadenopathy:     Cervical: No cervical adenopathy.  Neurological:     Mental Status: She is alert.  Psychiatric:        Attention and Perception: Attention normal.     Wt Readings from Last 3 Encounters:  10/08/20 120 lb (54.4 kg)  08/07/20 119 lb (54 kg)  07/20/20 116 lb 13.5 oz (53 kg)    BP (!) 132/40   Pulse 61   Temp 98.1 F (36.7 C) (Oral)   Ht 5' (1.524 m)   Wt 120 lb (54.4 kg)   SpO2 99%   BMI 23.44 kg/m   Assessment and Plan: 1. Trochanteric bursitis of right hip Continue topical - stop any that cause itching Use heat or ice; continue tylenol if helpful Short steroid taper If persistent, may need Ortho referral - predniSONE (DELTASONE) 10 MG tablet; Take 1 tablet (10 mg total) by mouth as directed for 6 days. Take 6,5,4,3,2,1 then stop  Dispense: 21 tablet; Refill: 0  2. COPD (chronic obstructive pulmonary disease) with chronic bronchitis (HCC) - promethazine-dextromethorphan (PROMETHAZINE-DM) 6.25-15 MG/5ML syrup; Take 5 mLs by mouth 4 (four) times daily as needed for cough.  Dispense: 180 mL; Refill: 0   Partially dictated using Editor, commissioning. Any errors are unintentional.  [name], MD [facility]  10/08/2020

## 2020-10-22 ENCOUNTER — Ambulatory Visit: Payer: Medicare Other | Admitting: Pharmacist

## 2020-10-22 DIAGNOSIS — N184 Chronic kidney disease, stage 4 (severe): Secondary | ICD-10-CM

## 2020-10-22 DIAGNOSIS — I10 Essential (primary) hypertension: Secondary | ICD-10-CM

## 2020-10-22 DIAGNOSIS — Z794 Long term (current) use of insulin: Secondary | ICD-10-CM

## 2020-10-22 NOTE — Patient Instructions (Addendum)
Visit Information  It was a pleasure speaking with you today. Thank you for letting me be part of your clinical team. Please call with any questions or concerns.   Goals Addressed            This Visit's Progress   . PharmD "I want to be healthy as I can"       CARE PLAN ENTRY (see longitudinal plan of care for additional care plan information)  Current Barriers:  . Chronic Disease Management support, education, and care coordination needs related to Hypertension, Hyperlipidemia, Diabetes, Atrial Fibrillation, Heart Failure, Coronary Artery Disease, GERD, and COPD . Spoke with daughter, Judson Roch.   Hypertension BP Readings from Last 3 Encounters:  10/08/20 (!) 132/40  08/07/20 106/68  07/20/20 (!) 160/55   . Pharmacist Clinical Goal(s): o Over the next 60 days, patient will work with PharmD and providers to achieve BP goal <130/80 . Current regimen:  . Losartan 25 mg bid . Amlodipine 5 mg bid . Torsemide 10  mg  bid . Clonidine 0.1 mg up to bid for SBP> 180 . Interventions: o Provided diet and exercise counseling. o Reviewed cardiology notes, verified dosing regimen . Patient self care activities - Over the next 60 days, patient will: o Check BP bid, document, and provide at future appointments o Ensure daily salt intake < 2300 mg/day   Diabetes Lab Results  Component Value Date/Time   HGBA1C 7.7 09/30/2020 12:00 AM   HGBA1C 8.2 06/14/2020 12:00 AM   . Pharmacist Clinical Goal(s): o Over the next 60 days, patient will work with PharmD and providers to achieve A1c goal <7% . Current regimen:  . Novolog 3 units tid with meals, increase to 4 u if AC BG >250 . basaglar 8 units after lunch . Interventions: o Reviewed goal glucose readings for an A1c of <7%, we want to see fasting sugars <130 and 2 hour after meal sugars <180.  o Provided dietary counseling o Verified Free Style Elenor Legato is set up and working well o Verified patient is taking Novolog before meals . Patient  self care activities - Over the next 60 days, patient will: o Check blood sugar 3-4 times daily, document, and provide at future appointments o Contact provider with any episodes of hypoglycemia   Medication management . Pharmacist Clinical Goal(s): o Over the next 60 days, patient will work with PharmD and providers to achieve optimal medication adherence . Current pharmacy: [facility] . Interventions o Comprehensive medication review performed. o Continue current medication management strategy . Patient self care activities - Over the next 60 days, patient will: o Focus on medication adherence by fill dates o Take medications as prescribed o Report any questions or concerns to PharmD and/or provider(s)  Please see past updates related to this goal by clicking on the "Past Updates" button in the selected goal   (see longitudinal plan of care for additional care plan information)         The patient verbalized understanding of instructions, educational materials, and care plan provided today and agreed to receive a mailed copy of patient instructions, educational materials, and care plan.   Telephone follow up appointment with pharmacy team member scheduled for: 2 months  Junita Push. Katherine Tout PharmD, BCPS Clinical Pharmacist [phone]  Diabetes Mellitus and Nutrition, Adult When you have diabetes (diabetes mellitus), it is very important to have healthy eating habits because your blood sugar (glucose) levels are greatly affected by what you eat and drink. Eating healthy foods in  the appropriate amounts, at about the same times every day, can help you:  Control your blood glucose.  Lower your risk of heart disease.  Improve your blood pressure.  Reach or maintain a healthy weight. Every person with diabetes is different, and each person has different needs for a meal plan. Your health care provider may recommend that you work with a diet and nutrition specialist (dietitian) to  make a meal plan that is best for you. Your meal plan may vary depending on factors such as:  The calories you need.  The medicines you take.  Your weight.  Your blood glucose, blood pressure, and cholesterol levels.  Your activity level.  Other health conditions you have, such as heart or kidney disease. How do carbohydrates affect me? Carbohydrates, also called carbs, affect your blood glucose level more than any other type of food. Eating carbs naturally raises the amount of glucose in your blood. Carb counting is a method for keeping track of how many carbs you eat. Counting carbs is important to keep your blood glucose at a healthy level, especially if you use insulin or take certain oral diabetes medicines. It is important to know how many carbs you can safely have in each meal. This is different for every person. Your dietitian can help you calculate how many carbs you should have at each meal and for each snack. Foods that contain carbs include:  Bread, cereal, rice, pasta, and crackers.  Potatoes and corn.  Peas, beans, and lentils.  Milk and yogurt.  Fruit and juice.  Desserts, such as cakes, cookies, ice cream, and candy. How does alcohol affect me? Alcohol can cause a sudden decrease in blood glucose (hypoglycemia), especially if you use insulin or take certain oral diabetes medicines. Hypoglycemia can be a life-threatening condition. Symptoms of hypoglycemia (sleepiness, dizziness, and confusion) are similar to symptoms of having too much alcohol. If your health care provider says that alcohol is safe for you, follow these guidelines:  Limit alcohol intake to no more than 1 drink per day for nonpregnant women and 2 drinks per day for men. One drink equals 12 oz of beer, 5 oz of wine, or 1 oz of hard liquor.  Do not drink on an empty stomach.  Keep yourself hydrated with water, diet soda, or unsweetened iced tea.  Keep in mind that regular soda, juice, and other  mixers may contain a lot of sugar and must be counted as carbs. What are tips for following this plan?  Reading food labels  Start by checking the serving size on the "Nutrition Facts" label of packaged foods and drinks. The amount of calories, carbs, fats, and other nutrients listed on the label is based on one serving of the item. Many items contain more than one serving per package.  Check the total grams (g) of carbs in one serving. You can calculate the number of servings of carbs in one serving by dividing the total carbs by 15. For example, if a food has 30 g of total carbs, it would be equal to 2 servings of carbs.  Check the number of grams (g) of saturated and trans fats in one serving. Choose foods that have low or no amount of these fats.  Check the number of milligrams (mg) of salt (sodium) in one serving. Most people should limit total sodium intake to less than 2,300 mg per day.  Always check the nutrition information of foods labeled as "low-fat" or "nonfat". These foods may  be higher in added sugar or refined carbs and should be avoided.  Talk to your dietitian to identify your daily goals for nutrients listed on the label. Shopping  Avoid buying canned, premade, or processed foods. These foods tend to be high in fat, sodium, and added sugar.  Shop around the outside edge of the grocery store. This includes fresh fruits and vegetables, bulk grains, fresh meats, and fresh dairy. Cooking  Use low-heat cooking methods, such as baking, instead of high-heat cooking methods like deep frying.  Cook using healthy oils, such as olive, canola, or sunflower oil.  Avoid cooking with butter, cream, or high-fat meats. Meal planning  Eat meals and snacks regularly, preferably at the same times every day. Avoid going long periods of time without eating.  Eat foods high in fiber, such as fresh fruits, vegetables, beans, and whole grains. Talk to your dietitian about how many servings  of carbs you can eat at each meal.  Eat 4-6 ounces (oz) of lean protein each day, such as lean meat, chicken, fish, eggs, or tofu. One oz of lean protein is equal to: ? 1 oz of meat, chicken, or fish. ? 1 egg. ?  cup of tofu.  Eat some foods each day that contain healthy fats, such as avocado, nuts, seeds, and fish. Lifestyle  Check your blood glucose regularly.  Exercise regularly as told by your health care provider. This may include: ? 150 minutes of moderate-intensity or vigorous-intensity exercise each week. This could be brisk walking, biking, or water aerobics. ? Stretching and doing strength exercises, such as yoga or weightlifting, at least 2 times a week.  Take medicines as told by your health care provider.  Do not use any products that contain nicotine or tobacco, such as cigarettes and e-cigarettes. If you need help quitting, ask your health care provider.  Work with a Social worker or diabetes educator to identify strategies to manage stress and any emotional and social challenges. Questions to ask a health care provider  Do I need to meet with a diabetes educator?  Do I need to meet with a dietitian?  What number can I call if I have questions?  When are the best times to check my blood glucose? Where to find more information:  American Diabetes Association: diabetes.org  Academy of Nutrition and Dietetics: www.eatright.[employer] of Diabetes and Digestive and Kidney Diseases (NIH): [employer] Summary  A healthy meal plan will help you control your blood glucose and maintain a healthy lifestyle.  Working with a diet and nutrition specialist (dietitian) can help you make a meal plan that is best for you.  Keep in mind that carbohydrates (carbs) and alcohol have immediate effects on your blood glucose levels. It is important to count carbs and to use alcohol carefully. This information is not intended to replace advice given to you by your  health care provider. Make sure you discuss any questions you have with your health care provider. Document Revised: 09/24/2017 Document Reviewed: 11/16/2016 Elsevier Patient Education  2020 [employer].

## 2020-10-22 NOTE — Chronic Care Management (AMB) (Signed)
Chronic Care Management Pharmacy  Name: CELLIE DARDIS  MRN: [id number] DOB: 1935/09/03   Chief Complaint/ HPI  [name],  85 y.o. , female presents for their Follow-Up CCM visit with the clinical pharmacist via telephone due to COVID-19 Pandemic. Spoke with daughter, Judson Roch.   PCP : [name], MD Patient Care Team: [name], MD as PCP - General (Internal Medicine) [name], Dolores Lory, MD as Consulting Physician (Vascular Surgery) [name], MD as Consulting Physician (Endocrinology) [name], DPM as Consulting Physician (Podiatry) Corey Skains, MD as Consulting Physician (Cardiology) Jannet Mantis, MD as Consulting Physician (Dermatology) Deetta Perla, MD as Consulting Physician (Neurosurgery) [name], RN as Case Manager (General Practice) Vladimir Faster, [facility] (Pharmacist) Murlean Iba, MD (Nephrology)  Their chronic conditions include: Hypertension, Hyperlipidemia, Diabetes, Atrial Fibrillation, Heart Failure, Coronary Artery Disease, GERD, COPD and Chronic Kidney Disease   Office Visits: 10/08/20- Dr. Army Melia- trochanter bursitis- prednisone, promet DM refill 08/07/20 - Dr. Army Melia - refill prometh/DM, BP 106/68 05/02/20 - Dr. Army Melia - labs, BP recheck-prometh/codeine for hs cough   Consult Visit: 10/10/20- Call to endo- Prednisone taper, BG 400set sliding scale, she can take 1 extra if 200-250, 2 extra if 251-300, 3 extra if 300-350, etc. 10/08/20- S. Tye Savoy, [place]- Cards- BP 136/56, increased amlodipine to 5 mg bid 10/07/20- Dr Candiss Norse, Nephrology- BP 192/ 46- avoid nsaids , iv contrast, 30-40 oz water daily, torsemide prn 09/30/20- Dr. Honor Junes- endo - basaglar decreased to 10 u based on avg Bg from Freestyle   Allergies  Allergen Reactions   Saxagliptin Diarrhea   Epinephrine Other (See Comments)    Patient does not remember what happens when she uses this   Atorvastatin Other (See Comments)    Muscle aches    Codeine Other (See Comments)    Upset stomach   Ezetimibe Other (See Comments)    Myalgias(ZETIA)   Limonene Rash    Patient does not recall this reaction   Nitrofurantoin Rash and Other (See Comments)    Pruitus   Sulfa Antibiotics Rash and Other (See Comments)    Sore mouth     Medications: Outpatient Encounter Medications as of 10/22/2020  Medication Sig Note   acetaminophen (TYLENOL) 650 MG CR tablet Take 1,300 mg by mouth every 8 (eight) hours as needed for pain.    albuterol (VENTOLIN HFA) 108 (90 Base) MCG/ACT inhaler Inhale 2 puffs into the lungs every 6 (six) hours as needed for wheezing or shortness of breath.    amLODipine (NORVASC) 5 MG tablet Take 5 mg by mouth in the morning and at bedtime.    apixaban (ELIQUIS) 2.5 MG TABS tablet Take 1 tablet (2.5 mg total) by mouth 2 (two) times daily.    feeding supplement, GLUCERNA SHAKE, (GLUCERNA SHAKE) LIQD Take 237 mLs by mouth 3 (three) times daily between meals.    ferrous sulfate 325 (65 FE) MG tablet Take 325 mg by mouth daily with breakfast.    gabapentin (NEURONTIN) 100 MG capsule Take 1 capsule (100 mg total) by mouth at bedtime. (Patient taking differently: Take 100 mg by mouth at bedtime.)    insulin aspart (NOVOLOG FLEXPEN) 100 UNIT/ML FlexPen Inject 3 Units into the skin 3 (three) times daily with meals. At lunch (Patient taking differently: Inject 20 Units into the skin daily. At breakfast- sliding scale) 02/28/2020: 3 units TIDAC   Insulin Glargine (BASAGLAR KWIKPEN) 100 UNIT/ML INJECT 12 UNITS UNDER THE SKIN DAILY    losartan (  COZAAR) 25 MG tablet Take 25 mg by mouth 2 (two) times daily. Unsure of dosage    melatonin 5 MG TABS Take 0.5 tablets (2.5 mg total) by mouth at bedtime as needed (sleep).    Multiple Vitamins-Minerals (PRESERVISION AREDS PO) Take 1 capsule by mouth 2 (two) times daily.     rosuvastatin (CRESTOR) 5 MG tablet Take 5 mg by mouth every other day. Mon, Wed, Fri and Sat.     torsemide (DEMADEX) 20 MG tablet Take 2 tablets (40 mg total) by mouth daily. (Patient taking differently: Take 40 mg by mouth as needed.)    B-D ULTRAFINE III SHORT PEN 31G X 8 MM MISC USE AS DIRECTED (Patient not taking: Reported on 10/22/2020)    cloNIDine (CATAPRES) 0.2 MG tablet Take 1 tablet (0.2 mg total) by mouth 2 (two) times daily. Please check blood pressure prior to taking medication.  Only take if blood pressure is greater than 161 systolic. (Patient taking differently: Take 0.2 mg by mouth as needed. Please check blood pressure prior to taking medication.  Only take if blood pressure is greater than 096 systolic.)    Cyanocobalamin 1000 MCG TBCR Take 1,000 mcg by mouth daily.  (Patient not taking: Reported on 10/22/2020)    promethazine-dextromethorphan (PROMETHAZINE-DM) 6.25-15 MG/5ML syrup Take 5 mLs by mouth 4 (four) times daily as needed for cough.    No facility-administered encounter medications on file as of 10/22/2020.     Current Diagnosis/Assessment:    Goals Addressed            This Visit's Progress    PharmD "I want to be healthy as I can"       CARE PLAN ENTRY (see longitudinal plan of care for additional care plan information)  Current Barriers:   Chronic Disease Management support, education, and care coordination needs related to Hypertension, Hyperlipidemia, Diabetes, Atrial Fibrillation, Heart Failure, Coronary Artery Disease, GERD, and COPD  Spoke with daughter, Judson Roch.   Hypertension BP Readings from Last 3 Encounters:  10/08/20 (!) 132/40  08/07/20 106/68  07/20/20 (!) 160/55    Pharmacist Clinical Goal(s): o Over the next 60 days, patient will work with PharmD and providers to achieve BP goal <130/80  Current regimen:   Losartan 25 mg bid  Amlodipine 5 mg bid  Torsemide 10  mg  bid  Clonidine 0.1 mg up to bid for SBP> 180  Interventions: o Provided diet and exercise counseling. o Reviewed cardiology notes, verified dosing  regimen  Patient self care activities - Over the next 60 days, patient will: o Check BP bid, document, and provide at future appointments o Ensure daily salt intake < 2300 mg/day   Diabetes Lab Results  Component Value Date/Time   HGBA1C 7.7 09/30/2020 12:00 AM   HGBA1C 8.2 06/14/2020 12:00 AM    Pharmacist Clinical Goal(s): o Over the next 60 days, patient will work with PharmD and providers to achieve A1c goal <7%  Current regimen:   Novolog 3 units tid with meals, increase to 4 u if AC BG >250  basaglar 8 units after lunch  Interventions: o Reviewed goal glucose readings for an A1c of <7%, we want to see fasting sugars <130 and 2 hour after meal sugars <180.  o Provided dietary counseling o Verified Free Style Elenor Legato is set up and working well o Verified patient is taking Novolog before meals  Patient self care activities - Over the next 60 days, patient will: o Check blood sugar 3-4 times daily,  document, and provide at future appointments o Contact provider with any episodes of hypoglycemia   Medication management  Pharmacist Clinical Goal(s): o Over the next 60 days, patient will work with PharmD and providers to achieve optimal medication adherence  Current pharmacy: [facility]  Interventions o Comprehensive medication review performed. o Continue current medication management strategy  Patient self care activities - Over the next 60 days, patient will: o Focus on medication adherence by fill dates o Take medications as prescribed o Report any questions or concerns to PharmD and/or provider(s)  Please see past updates related to this goal by clicking on the "Past Updates" button in the selected goal   (see longitudinal plan of care for additional care plan information)         Diabetes/ Neuropathy   A1c goal <7%  Recent Relevant Labs: Lab Results  Component Value Date/Time   HGBA1C 7.7 09/30/2020 12:00 AM   HGBA1C 8.2 06/14/2020 12:00 AM     Last diabetic Eye exam:  Lab Results  Component Value Date/Time   HMDIABEYEEXA Retinopathy (A) 09/06/2019 12:00 AM    BMP Latest Ref Rng & Units 07/20/2020 05/02/2020 03/18/2020  Glucose 70 - 99 mg/dL 206(H) 224(H) 120(H)  BUN 8 - 23 mg/dL 44(H) 41(H) 40(H)  Creatinine 0.44 - 1.00 mg/dL 1.42(H) 1.57(H) 1.72(H)  BUN/Creat Ratio 12 - 28 - 26 23  Sodium 135 - 145 mmol/L 136 135 134  Potassium 3.5 - 5.1 mmol/L 4.0 5.3(H) 5.1  Chloride 98 - 111 mmol/L 99 91(L) 95(L)  CO2 22 - 32 mmol/L 25 27 23   Calcium 8.9 - 10.3 mg/dL 10.0 9.5 9.5    Last diabetic Foot exam: No results found for: HMDIABFOOTEX   Checking BG: 3-4 times daily. Very happy with Freestyle Libre  Recent fasting  Readings: 133,163 , 275, 348- 9:40pm , 11:07 314     Patient has failed these meds in past: saxagliptin-diarrhea Patient is currently uncontrolled on the following medications:  Gabapentin 100mg  at bedtime  Novolog 3  units after lunch units tid with meals, increase to 4 u if AC BG >250  Basaglar 8 units  daily   We discussed: Patient recently on prednisone taper which increased BG to >400. Endo office increased SS Novolog and Judson Roch reports readings are back to normal. They typically have dinner around 7:30 and Mrs. Perno likes to have an apple before bed. Freestyle libre data indicated Average BG less than what A1c showed prior to prednisone. Judson Roch denies any episodes of hypoglycemia. Discussed possible addition of GLP-1/SGLT-2 for renal protection in addition to BG. Encouraged Sarah to discuss at next endo appt  Plan  Continue current medications.     Hypertension/HFpEF    BP goal is:  <130/80  Office blood pressures are  BP Readings from Last 3 Encounters:  10/08/20 (!) 132/40  08/07/20 106/68  07/20/20 (!) 160/55   BMP Latest Ref Rng & Units 07/20/2020 05/02/2020 03/18/2020  Glucose 70 - 99 mg/dL 206(H) 224(H) 120(H)  BUN 8 - 23 mg/dL 44(H) 41(H) 40(H)  Creatinine 0.44 - 1.00 mg/dL 1.42(H) 1.57(H)  1.72(H)  BUN/Creat Ratio 12 - 28 - 26 23  Sodium 135 - 145 mmol/L 136 135 134  Potassium 3.5 - 5.1 mmol/L 4.0 5.3(H) 5.1  Chloride 98 - 111 mmol/L 99 91(L) 95(L)  CO2 22 - 32 mmol/L 25 27 23   Calcium 8.9 - 10.3 mg/dL 10.0 9.5 9.5    Patient checks BP at home twice daily Patient home BP readings are  ranging: 128/51, 136/51,   Patient has failed these meds in the past: Patient is currently query controlled on the following medications:   Losartan 25 mg bid  Amlodipine 5 mg  mg bid  Torsemide 10 mg bid   Clonidine 0.1 mg up to bid for SBP> 180  We discussed patient's daughter takes BP twice daily. Reports improvement and less fluctuations since increase in amlodipine. Has not needed clonidine recently.   Plan  Continue current medications   AFIB   Patient is currently rate controlled. Office heart rates are  Pulse Readings from Last 3 Encounters:  10/08/20 61  08/07/20 70  07/20/20 71    CHA2DS2-VASc Score = 7  The patient's score is based upon: CHF History: Yes HTN History: Yes Diabetes History: Yes Stroke History: No Vascular Disease History: Yes Age Score: 2 Gender Score: 1   {  ASSESSMENT AND PLAN: Paroxysmal Atrial Fibrillation (ICD10:  I48.0) The patient's CHA2DS2-VASc score is 7, indicating a 11.2% annual risk of stroke.  (calc as 6 per Cardiology)  Secondary Hypercoagulable State (ICD10:  [phone]) The patient is at significant risk for stroke/thromboembolism based upon her CHA2DS2-VASc Score of 7.  Continue Apixaban (Eliquis). 2.5 mg bid  We discussed:  Reduced dose of apixaban continued by cardiology due to h/o bleeding and impaired renal function. Reviewed signs/symptoms of bleeding and avoidance of NSAIDS or otc meds without consulting MD or PharmD  Plan  Continue current medications        Medication Management   Pt uses [facility] for all medications Uses pill box? Yes. Daughter fills for her. Pt endorses  compliance  Plan  Continue current medication management strategy    Follow up: 2  month phone visit  Junita Push. Kenton Kingfisher PharmD, [facility] [phone]

## 2020-11-06 ENCOUNTER — Other Ambulatory Visit: Payer: Self-pay | Admitting: Internal Medicine

## 2020-11-06 DIAGNOSIS — I480 Paroxysmal atrial fibrillation: Secondary | ICD-10-CM

## 2020-11-07 ENCOUNTER — Other Ambulatory Visit: Payer: Self-pay | Admitting: Internal Medicine

## 2020-11-07 DIAGNOSIS — E118 Type 2 diabetes mellitus with unspecified complications: Secondary | ICD-10-CM | POA: Diagnosis not present

## 2020-11-07 DIAGNOSIS — Z794 Long term (current) use of insulin: Secondary | ICD-10-CM | POA: Diagnosis not present

## 2020-11-07 NOTE — Telephone Encounter (Signed)
Requested medication (s) are due for refill today - unsure  Requested medication (s) are on the active medication list -yes  Future visit scheduled -yes  Last refill: 03/11/20  Notes to clinic: Request Rx prescribed by outside provider  Requested Prescriptions  Pending Prescriptions Disp Refills   gabapentin (NEURONTIN) 100 MG capsule [Pharmacy Med Name: GABAPENTIN 100MG  CAPSULES] 180 capsule     Sig: TAKE 1 TO 2 CAPSULES(100 TO 200 MG) BY MOUTH AT BEDTIME      Neurology: Anticonvulsants - gabapentin Passed - 11/07/2020  3:00 PM      Passed - Valid encounter within last 12 months    Recent Outpatient Visits           1 month ago Trochanteric bursitis of right hip   [facility] [name], MD   3 months ago COPD (chronic obstructive pulmonary disease) with chronic bronchitis [facility])   [facility] [name], MD   6 months ago Essential hypertension   [facility] [name], MD   7 months ago Essential hypertension   [facility] [name], MD   10 months ago History of GI diverticular bleed   [facility] [name], MD       Future Appointments             In 4 months [name], MD [facility], [facility]                 Requested Prescriptions  Pending Prescriptions Disp Refills   gabapentin (NEURONTIN) 100 MG capsule [Pharmacy Med Name: GABAPENTIN 100MG  CAPSULES] 180 capsule     Sig: TAKE 1 TO 2 CAPSULES(100 TO 200 MG) BY MOUTH AT BEDTIME      Neurology: Anticonvulsants - gabapentin Passed - 11/07/2020  3:00 PM      Passed - Valid encounter within last 12 months    Recent Outpatient Visits           1 month ago Trochanteric bursitis of right hip   [facility] [name], MD   3 months ago COPD (chronic obstructive pulmonary disease) with chronic bronchitis [facility])   [facility] [name], MD   6 months ago Essential hypertension    [place], Laura H, MD   7 months ago Essential hypertension   [place], [place]   10 months ago History of GI diverticular bleed   [facility] [name], MD       Future Appointments             In 4 months Army Melia Jesse Sans, MD [facility], [facility]

## 2020-11-07 NOTE — Telephone Encounter (Signed)
Left message for patient to set up appointment on medicine refill.

## 2020-11-25 DIAGNOSIS — H353221 Exudative age-related macular degeneration, left eye, with active choroidal neovascularization: Secondary | ICD-10-CM | POA: Diagnosis not present

## 2020-11-25 DIAGNOSIS — H353211 Exudative age-related macular degeneration, right eye, with active choroidal neovascularization: Secondary | ICD-10-CM | POA: Diagnosis not present

## 2020-11-27 ENCOUNTER — Other Ambulatory Visit (INDEPENDENT_AMBULATORY_CARE_PROVIDER_SITE_OTHER): Payer: Self-pay | Admitting: Nurse Practitioner

## 2020-11-27 DIAGNOSIS — I739 Peripheral vascular disease, unspecified: Secondary | ICD-10-CM

## 2020-11-28 ENCOUNTER — Other Ambulatory Visit: Payer: Self-pay

## 2020-11-28 ENCOUNTER — Ambulatory Visit (INDEPENDENT_AMBULATORY_CARE_PROVIDER_SITE_OTHER): Payer: Medicare Other

## 2020-11-28 ENCOUNTER — Ambulatory Visit (INDEPENDENT_AMBULATORY_CARE_PROVIDER_SITE_OTHER): Payer: Medicare Other | Admitting: Vascular Surgery

## 2020-11-28 DIAGNOSIS — I739 Peripheral vascular disease, unspecified: Secondary | ICD-10-CM | POA: Diagnosis not present

## 2020-11-29 ENCOUNTER — Other Ambulatory Visit: Payer: Self-pay | Admitting: Internal Medicine

## 2020-11-29 NOTE — Telephone Encounter (Signed)
Pt has an appt sch for 12-25-2020. Pt is on wait list

## 2020-12-03 ENCOUNTER — Other Ambulatory Visit (INDEPENDENT_AMBULATORY_CARE_PROVIDER_SITE_OTHER): Payer: Self-pay | Admitting: Vascular Surgery

## 2020-12-03 ENCOUNTER — Encounter (INDEPENDENT_AMBULATORY_CARE_PROVIDER_SITE_OTHER): Payer: Self-pay | Admitting: *Deleted

## 2020-12-03 DIAGNOSIS — I739 Peripheral vascular disease, unspecified: Secondary | ICD-10-CM

## 2020-12-05 DIAGNOSIS — M5126 Other intervertebral disc displacement, lumbar region: Secondary | ICD-10-CM | POA: Diagnosis not present

## 2020-12-05 DIAGNOSIS — M62830 Muscle spasm of back: Secondary | ICD-10-CM | POA: Diagnosis not present

## 2020-12-05 DIAGNOSIS — M5416 Radiculopathy, lumbar region: Secondary | ICD-10-CM | POA: Diagnosis not present

## 2020-12-10 DIAGNOSIS — M5416 Radiculopathy, lumbar region: Secondary | ICD-10-CM | POA: Diagnosis not present

## 2020-12-10 DIAGNOSIS — M5126 Other intervertebral disc displacement, lumbar region: Secondary | ICD-10-CM | POA: Diagnosis not present

## 2020-12-12 ENCOUNTER — Ambulatory Visit (INDEPENDENT_AMBULATORY_CARE_PROVIDER_SITE_OTHER): Payer: Medicare Other | Admitting: Pharmacist

## 2020-12-12 DIAGNOSIS — Z794 Long term (current) use of insulin: Secondary | ICD-10-CM | POA: Diagnosis not present

## 2020-12-12 DIAGNOSIS — E1122 Type 2 diabetes mellitus with diabetic chronic kidney disease: Secondary | ICD-10-CM

## 2020-12-12 DIAGNOSIS — N184 Chronic kidney disease, stage 4 (severe): Secondary | ICD-10-CM | POA: Diagnosis not present

## 2020-12-12 DIAGNOSIS — I10 Essential (primary) hypertension: Secondary | ICD-10-CM

## 2020-12-12 NOTE — Progress Notes (Signed)
Chronic Care Management Pharmacy Note  12/19/2020 Name:  Jill Hudson MRN:  [id number] DOB:  1935-01-20  Subjective: Jill Hudson is an 86 y.o. year old female who is a primary patient of Army Melia, Jesse Sans, MD.  The CCM team was consulted for assistance with disease management and care coordination needs.    Engaged with patient by telephone for follow up visit in response to provider referral for pharmacy case management and/or care coordination services.   Consent to Services:  The patient was given information about Chronic Care Management services, agreed to services, and gave verbal consent prior to initiation of services.  Please see initial visit note for detailed documentation.   Patient Care Team: [name], MD as PCP - General (Internal Medicine) [name], Dolores Lory, MD as Consulting Physician (Vascular Surgery) [name], MD as Consulting Physician (Endocrinology) [name], DPM as Consulting Physician (Podiatry) Corey Skains, MD as Consulting Physician (Cardiology) Jannet Mantis, MD as Consulting Physician (Dermatology) Deetta Perla, MD as Consulting Physician (Neurosurgery) [name], RN as Case Manager (General Practice) Vladimir Faster, [facility] (Pharmacist) Murlean Iba, MD (Nephrology)  Recent office visits: 10/08/21-Dr. Amanda Pea- trochanter bursitis, COPD with chronic bronchitis- prednisone, prometh DM  Recent consult visits: 2/15.22- Chasnis- ESI injection- dexamtnhs and 2% lidocaine 12/05/20- [facility]- eval for ESi 11/28/20- Vascular- US ABI bilat  Hospital visits: 07/20/20- ED- epistaxis  Objective:  Lab Results  Component Value Date   CREATININE 1.42 (H) 07/20/2020   BUN 44 (H) 07/20/2020   GFRNONAA 34 (L) 07/20/2020   GFRAA 39 (L) 07/20/2020   NA 136 07/20/2020   K 4.0 07/20/2020   CALCIUM 10.0 07/20/2020   CO2 25 07/20/2020    Lab Results  Component Value Date/Time   HGBA1C 7.7 09/30/2020 12:00 AM   HGBA1C  8.2 06/14/2020 12:00 AM    Last diabetic Eye exam:  Lab Results  Component Value Date/Time   HMDIABEYEEXA Retinopathy (A) 09/06/2019 12:00 AM    Last diabetic Foot exam: No results found for: HMDIABFOOTEX   Lab Results  Component Value Date   CHOL 117 02/19/2020   HDL 56 02/19/2020   LDLCALC 54 02/19/2020   TRIG 34 02/19/2020   CHOLHDL 2.1 02/19/2020    Hepatic Function Latest Ref Rng & Units 02/21/2020 12/26/2019 12/25/2019  Total Protein 6.5 - 8.1 g/dL 6.1(L) 5.2(L) 6.0(L)  Albumin 3.5 - 5.0 g/dL 3.5 3.0(L) 3.5  AST 15 - 41 U/L 33 11(L) 18  ALT 0 - 44 U/L 50(H) 11 14  Alk Phosphatase 38 - 126 U/L 78 55 63  Total Bilirubin 0.3 - 1.2 mg/dL 0.7 0.8 0.7  Bilirubin, Direct 0.0 - 0.2 mg/dL <0.1 - -    Lab Results  Component Value Date/Time   TSH 3.512 12/25/2019 07:31 AM   TSH 3.750 12/26/2018 04:12 PM   TSH 5.200 (H) 06/02/2017 10:14 AM   FREET4 1.22 01/16/2016 11:35 AM    CBC Latest Ref Rng & Units 07/20/2020 02/26/2020 02/24/2020  WBC 4.0 - 10.5 K/uL 8.1 16.3(H) 19.2(H)  Hemoglobin 12.0 - 15.0 g/dL 12.5 10.7(L) 10.0(L)  Hematocrit 36.0 - 46.0 % 36.9 32.0(L) 29.7(L)  Platelets 150 - 400 K/uL 228 305 326    No results found for: VD25OH  Clinical ASCVD: Yes  The ASCVD Risk score Mikey Bussing DC Jr., et al., 2013) failed to calculate for the following reasons:   The 2013 ASCVD risk score is only valid for ages 52 to 27   The patient  has a prior MI or stroke diagnosis    Depression screen [facility] 2/9 10/08/2020 09/04/2020 09/04/2020  Decreased Interest 3 0 0  Down, Depressed, Hopeless 0 0 0  PHQ - 2 Score 3 0 0  Altered sleeping 0 - -  Tired, decreased energy 3 - -  Change in appetite 0 - -  Feeling bad or failure about yourself  0 - -  Trouble concentrating 3 - -  Moving slowly or fidgety/restless 0 - -  Suicidal thoughts 0 - -  PHQ-9 Score 9 - -  Difficult doing work/chores - - -  Some recent data might be hidden      Social History   Tobacco Use  Smoking Status Former  Smoker  . Packs/day: 2.00  . Years: 37.00  . Pack years: 74.00  . Types: Cigarettes  . Quit date: 67  . Years since quitting: 42.1  Smokeless Tobacco Never Used  Tobacco Comment   smoking cessation materials not required   BP Readings from Last 3 Encounters:  10/08/20 (!) 132/40  08/07/20 106/68  07/20/20 (!) 160/55   Pulse Readings from Last 3 Encounters:  10/08/20 61  08/07/20 70  07/20/20 71   Wt Readings from Last 3 Encounters:  10/08/20 120 lb (54.4 kg)  08/07/20 119 lb (54 kg)  07/20/20 116 lb 13.5 oz (53 kg)    Assessment/Interventions: Review of patient past medical history, allergies, medications, health status, including review of consultants reports, laboratory and other test data, was performed as part of comprehensive evaluation and provision of chronic care management services.   SDOH:  (Social Determinants of Health) assessments and interventions performed: No   CCM Care Plan  Allergies  Allergen Reactions  . Saxagliptin Diarrhea  . Epinephrine Other (See Comments)    Patient does not remember what happens when she uses this  . Atorvastatin Other (See Comments)    Muscle aches  . Codeine Other (See Comments)    Upset stomach  . Ezetimibe Other (See Comments)    Myalgias(ZETIA)  . Limonene Rash    Patient does not recall this reaction  . Nitrofurantoin Rash and Other (See Comments)    Pruitus  . Sulfa Antibiotics Rash and Other (See Comments)    Sore mouth     Medications Reviewed Today    Reviewed by Vladimir Faster, [facility] (Pharmacist) on 12/19/20 at [place] List Status: <None>  Medication Order Taking? Sig Documenting Provider Last Dose Status Informant  acetaminophen (TYLENOL) 650 MG CR tablet [id number] Yes Take 1,300 mg by mouth every 8 (eight) hours as needed for pain. [provider] Taking Active Child  albuterol (VENTOLIN HFA) 108 (90 Base) MCG/ACT inhaler [id number] Yes Inhale 2 puffs into the lungs every 6 (six) hours as  needed for wheezing or shortness of breath. Loletha Grayer, MD Taking Active   amLODipine (NORVASC) 5 MG tablet [id number] Yes Take 5 mg by mouth in the morning and at bedtime. [provider] Taking Active Self  B-D ULTRAFINE III SHORT PEN 31G X 8 MM MISC [id number]  USE AS DIRECTED  Patient not taking: Reported on 10/22/2020   [name], MD  Active   cloNIDine (CATAPRES) 0.2 MG tablet [id number]  Take 1 tablet (0.2 mg total) by mouth 2 (two) times daily. Please check blood pressure prior to taking medication.  Only take if blood pressure is greater than 707 systolic.  Patient taking differently: Take 0.2 mg by mouth as needed. Please check blood pressure  prior to taking medication.  Only take if blood pressure is greater than 542 systolic.   Marlana Salvage, PA  Expired 08/19/20 2359   Cyanocobalamin 1000 MCG TBCR [id number]  Take 1,000 mcg by mouth daily.   Patient not taking: Reported on 10/22/2020   [provider]  Active Child           Med Note Samara Snide, Lillia Pauls   Mon Feb 15, 2017  1:12 PM)    ELIQUIS 2.5 MG TABS tablet [id number] Yes TAKE 1 TABLET(2.5 MG) BY MOUTH TWICE DAILY [name], MD Taking Active   feeding supplement, GLUCERNA SHAKE, ([place]) LIQD [id number]  Take 237 mLs by mouth 3 (three) times daily between meals. Loletha Grayer, MD  Active Child  ferrous sulfate 325 (65 FE) MG tablet [id number]  Take 325 mg by mouth daily with breakfast. [provider]  Active Child  gabapentin (NEURONTIN) 100 MG capsule [id number] Yes Take 1 capsule (100 mg total) by mouth at bedtime.  Patient taking differently: Take 100 mg by mouth at bedtime.   Loletha Grayer, MD Taking Active   insulin aspart (NOVOLOG FLEXPEN) 100 UNIT/ML FlexPen [id number]  Inject 3 Units into the skin 3 (three) times daily with meals. At lunch  Patient taking differently: Inject 20 Units into the skin daily. At breakfast- sliding scale   [name], MD  Active  Child           Med Note [facility], Maple Mirza Feb 28, 2020 12:47 PM) 3 units [facility]  Insulin Glargine [facility] KWIKPEN) 100 UNIT/ML [id number] Yes INJECT 12 UNITS UNDER THE SKIN DAILY [name], MD Taking Active   losartan (COZAAR) 25 MG tablet [id number] Yes Take 25 mg by mouth 2 (two) times daily. Unsure of dosage [provider] Taking Active Self  melatonin 5 MG TABS [id number] Yes Take 0.5 tablets (2.5 mg total) by mouth at bedtime as needed (sleep). Loletha Grayer, MD Taking Active   Multiple Vitamins-Minerals (PRESERVISION AREDS PO) [id number] Yes Take 1 capsule by mouth 2 (two) times daily.  [provider] Taking Active Child  promethazine-dextromethorphan (PROMETHAZINE-DM) 6.25-15 MG/5ML syrup [id number] Yes Take 5 mLs by mouth 4 (four) times daily as needed for cough. [name], MD Taking Active   rosuvastatin (CRESTOR) 5 MG tablet [id number] Yes Take 5 mg by mouth every other day. Mon, Wed, Fri and Sat. [provider] Taking Active Child  torsemide (DEMADEX) 20 MG tablet [id number] Yes Take 2 tablets (40 mg total) by mouth daily.  Patient taking differently: Take 40 mg by mouth as needed.   Loletha Grayer, MD Taking Active           Patient Active Problem List   Diagnosis Date Noted  . Mood disorder ([place]) 05/02/2020  . Chronic heart failure with preserved ejection fraction ([place]) 04/12/2020  . Diverticulosis of large intestine without perforation or abscess with bleeding 03/27/2020  . AF (paroxysmal atrial fibrillation) ([place])   . Thrush   . COPD (chronic obstructive pulmonary disease) with chronic bronchitis ([place])   . CKD (chronic kidney disease), stage IIIa 02/18/2020  . Malnutrition of mild degree ([place]) 01/08/2020  . Acute blood loss anemia   . Diverticulosis of large intestine with hemorrhage   . GI bleeding 12/25/2019  . Rectal bleeding   . Age-related macular degeneration, dry, left eye 06/21/2019  . Age-related macular  degeneration, wet, right eye ([place]) 06/21/2019  . Moderate nonproliferative diabetic retinopathy associated with  type 2 diabetes mellitus ([place]) 06/21/2019  . Lumbosacral radiculopathy at L4 05/01/2019  . Underweight 05/01/2019  . Encounter for long-term (current) use of aspirin 10/31/2018  . Encounter for long-term (current) use of antiplatelets/antithrombotics 10/31/2018  . Long term current use of oral hypoglycemic drug 10/31/2018  . Encounter for long-term (current) use of insulin ([place]) 10/31/2018  . GIB (gastrointestinal bleeding) 08/11/2018  . Leg pain 07/03/2017  . Carpal tunnel syndrome on both sides 05/05/2017  . Myalgia due to HMG CoA reductase inhibitor 05/05/2017  . Adverse effect of antihyperlipidemic and antiarteriosclerotic drugs, initial encounter 05/05/2017  . History of CVA (cerebrovascular accident) 02/15/2017  . Degenerative disc disease, lumbar 12/21/2016  . Atherosclerosis of native arteries of extremity with intermittent claudication ([place]) 12/20/2016  . Bilateral carotid artery stenosis 12/20/2016  . Occlusion and stenosis of bilateral carotid arteries 12/20/2016  . Hip bursitis 05/18/2016  . Elevated TSH 01/18/2016  . CKD stage 3 due to type 2 diabetes mellitus ([place]) 01/16/2016  . Senile ecchymosis 01/16/2016  . Type II diabetes mellitus with renal manifestations ([place]) 09/16/2015  . GERD (gastroesophageal reflux disease) 07/24/2015  . PVD (peripheral vascular disease) ([place]) 05/17/2015  . Neoplasm of uncertain behavior of skin 05/17/2015  . Retinopathy, diabetic, proliferative ([place]) 04/11/2015  . Hyperlipidemia 04/11/2015  . Essential hypertension 04/11/2015  . Generalized OA 04/11/2015  . Proliferative diabetic retinopathy(362.02) 04/11/2015  . Arteriosclerosis of coronary artery 05/29/2013  . Hypertensive heart disease without CHF 05/29/2013    Immunization History  Administered Date(s) Administered  . Fluad Quad(high Dose 65+) 09/14/2019, 08/07/2020  .  Influenza, High Dose Seasonal PF 08/17/2017  . Influenza, Seasonal, Injecte, Preservative Fre 09/04/2005, 08/10/2007, 08/27/2010  . Influenza,inj,Quad PF,6+ Mos 08/08/2014, 09/16/2015, 09/23/2016, 07/22/2018  . Influenza,inj,quad, With Preservative 08/16/2013, 09/14/2019  . Pneumococcal Conjugate-13 09/10/2014  . Pneumococcal Polysaccharide-23 10/29/2003  . Pneumococcal-Unspecified 09/04/2010  . Tdap 10/26/2008  . Varicella 05/07/2011  . Zoster 05/07/2011    Conditions to be addressed/monitored:  Hypertension, Atrial Fibrillation, Heart Failure, Coronary Artery Disease, GERD, COPD, Chronic Kidney Disease, Osteoarthritis and DDD  Care Plan : [place]  Updates made by Vladimir Faster, RPH since 12/19/2020 12:00 AM    Problem: HTN, DM, Afib, chronic pain   Priority: High    Long-Range Goal: Disease Management   Start Date: 12/12/2020  This Visit's Progress: On track  Note:   Current Barriers:  . Unable to independently monitor therapeutic efficacy . Unable to maintain control of diabetes   Pharmacist Clinical Goal(s):  Marland Kitchen Over the next 90 days, patient will achieve control of diabetes as evidenced by HbA1c and SMBG through collaboration with PharmD and provider.  . Remove barriers to medication  Interventions: . 1:1 collaboration with [name], MD regarding development and update of comprehensive plan of care as evidenced by provider attestation and co-signature . Inter-disciplinary care team collaboration (see longitudinal plan of care) . Comprehensive medication review performed; medication list updated in electronic medical record  Hypertension/ CKD (BP goal <130/80) -uncontrolled -Current treatment: . Losartan 25 mg bid . Amlodipine 5 mg bid . Torsemide 10 mg bid . Clonidine 0.1 mg up to bid for SBP>180 -Medications previously tried: NA -Current home readings: 140s/60-80 -Denies hypotensive/hypertensive symptoms -Educated on Daily salt intake goal  < 2300 mg; Importance of home blood pressure monitoring; Symptoms of hypotension and importance of maintaining adequate hydration; -Counseled to monitor BP at home daily, document, and provide log at future appointments -Counseled on diet and exercise extensively Recommended to  continue current medication    Hyperlipidemia, coronary arteriosclerosis/ carotid stenosis bilat: (LDL goal < 70) -controlled -Current treatment: . Rosuvastatin 5 mg qd -Medications previously tried: NA -Educated on Cholesterol goals;  Benefits of statin for ASCVD risk reduction; Exercise goal of 150 minutes per week; -Recommended to continue current medication  Diabetes (A1c goal <7.5%) -uncontrolled -Current medications: . Novolog 3 units tid with meals, increase to 4 u if AC BG >250 . Basaglar 8 u after lunch . Gabapentin 100 mg qhs -Medications previously tried: saxagliptin- diarrhea  -Current home glucose readings Using [employer] . fasting glucose: 259  . post prandial glucose: 480 -Reports hypoglycemic/hyperglycemic symptoms -Current meal patterns: follows heart healthy, low-sodium diet . Dinner:protein and veggies . snacks: apple before bed . drinks: water -Current exercise: no structured exercise -Educated onA1c and blood sugar goals; Prevention and management of hypoglycemic episodes; Continuous glucose monitoring; -Counseled to check feet daily and get yearly eye exams -Counseled on diet and exercise extensively Educated on effects of steroid injection on BG and increasing insulin as instructed by endo has been elevated Recommend she follow SS for mealtime coverage from endo 1 extra for BG 200-250, 2 if BG 251-300,    Atrial Fibrillation (Goal: prevent stroke and major bleeding) -controlled  -CHADSVASC: 7 -Current treatment:  . Anticoagulation: Eliquis 2.5 mg bid -Medications previously tried: NA -Counseled on increased risk of stroke due to Afib and benefits of anticoagulation  for stroke prevention; importance of adherence to anticoagulant exactly as prescribed; seeking medical attention after a head injury or if there is blood in the urine/stool; -Recommended to continue current medication  Heart Failure (Goal: control symptoms and prevent exacerbations) controlled Type: Diastolic -NYHA Class: II (slight limitation of activity) -Ejection fraction: 55-60%(Date: 02/19/20) -Current treatment:  Torsemide 40 mg pen -Medications previously tried: na  DDD  Lumbosacral pain(Goal: improve QOL  & decrease symptoms) -Not ideally controlled -Current treatment  . Acetaminophen prn . Gabapentin 100 mg qhs . ESI injection 12/10/20 -Medications previously tried: baclofen  -Counseled on effects of steroids on BG Recommend she follow SS for mealtime coverage from endo 1 extra for BG 200-250, 2 if BG 251-300,     -Educated on Importance of weighing daily; if you gain more than 3 pounds in one day or 5 pounds in one week, Call Cardiology or PCP -   Patient Goals/Self-Care Activities . Over the next 90 days, patient will:  - take medications as prescribed check glucose multiple times daily, document, and provide at future appointments check blood pressure daily, document, and provide at future appointments weigh daily, and contact provider if weight gain of 3 lbs overnight  Follow Up Plan: Telephone follow up appointment with care management team member scheduled for: 3 months      Medication Assistance: None required.  Patient affirms current coverage meets needs.  Patient's preferred pharmacy is:  [id number], [place] [address] AT [address] [place] [address] Phone: [phone] Fax: [phone]  [place], [place] [address] [place] [address] Phone: [phone] Fax: [phone]  Uses pill box? Yes Pt endorses 90% compliance  We discussed: Benefits of  medication synchronization, packaging and delivery as well as enhanced pharmacist oversight with [employer]. Patient decided to: Continue current medication management strategy  Care Plan and Follow Up Patient Decision:  Patient agrees to Care Plan and Follow-up.  Plan: Telephone follow up appointment  with care management team member scheduled for:  3 months   Junita Push. Kenton Kingfisher PharmD, [facility] [phone]

## 2020-12-13 DIAGNOSIS — E118 Type 2 diabetes mellitus with unspecified complications: Secondary | ICD-10-CM | POA: Diagnosis not present

## 2020-12-13 DIAGNOSIS — Z794 Long term (current) use of insulin: Secondary | ICD-10-CM | POA: Diagnosis not present

## 2020-12-19 NOTE — Patient Instructions (Addendum)
Visit Information  It was a pleasure speaking with you today. Thank you for letting me be part of your clinical team. Please call with any questions or concerns.   Goals Addressed            This Visit's Progress   . Manage My Medicine       Timeframe:  Long-Range Goal Priority:  High Start Date:                             Expected End Date:                       Follow Up Date May follow up    - call for medicine refill 2 or 3 days before it runs out - call if I am sick and can't take my medicine - keep a list of all the medicines I take; vitamins and herbals too - use a pillbox to sort medicine    Why is this important?   . These steps will help you keep on track with your medicines.   Notes:     . Track and Manage Fluids and Swelling-Heart Failure       Timeframe:  Long-Range Goal Priority:  High Start Date:                             Expected End Date:                       Follow Up Date May follow up    - call office if I gain more than 2 pounds in one day or 5 pounds in one week - keep legs up while sitting - track weight in diary - watch for swelling in feet, ankles and legs every day - weigh myself daily    Why is this important?    It is important to check your weight daily and watch how much salt and liquids you have.   It will help you to manage your heart failure.    Notes:     . Track and Manage Heart Rate and Rhythm-Atrial Fibrillation       Timeframe:  Long-Range Goal Priority:  Medium Start Date:                             Expected End Date:                       Follow Up Date  May follow up  - check pulse (heart) rate once a day - make a plan to exercise regularly - make a plan to eat healthy - take medicine as prescribed    Why is this important?    Atrial fibrillation may have no symptoms. Sometimes the symptoms get worse or happen more often.   It is important to keep track of what your symptoms are and when they happen.    A change in symptoms is important to discuss with your doctor or nurse.   Being active and healthy eating will also help you manage your heart condition.     Notes:     . Track and Manage My Blood Pressure-Hypertension       Timeframe:  Long-Range Goal Priority:  Medium Start Date:  Expected End Date:                       Follow Up Date May follow up   - check blood pressure daily - write blood pressure results in a log or diary    Why is this important?    You won't feel high blood pressure, but it can still hurt your blood vessels.   High blood pressure can cause heart or kidney problems. It can also cause a stroke.   Making lifestyle changes like losing a little weight or eating less salt will help.   Checking your blood pressure at home and at different times of the day can help to control blood pressure.   If the doctor prescribes medicine remember to take it the way the doctor ordered.   Call the office if you cannot afford the medicine or if there are questions about it.     Notes:     . Track and Manage Symptoms-Heart Failure       Timeframe:  Long-Range Goal Priority:  Medium Start Date:                             Expected End Date:                       Follow Up Date May follow up    - eat more whole grains, fruits and vegetables, lean meats and healthy fats - know when to call the doctor    Why is this important?    You will be able to handle your symptoms better if you keep track of them.   Making some simple changes to your lifestyle will help.   Eating healthy is one thing you can do to take good care of yourself.    Notes:        The patient verbalized understanding of instructions, educational materials, and care plan provided today and agreed to receive a mailed copy of patient instructions, educational materials, and care plan.   Telephone follow up appointment with pharmacy team member scheduled for: 3  months  Junita Push. Kenton Kingfisher PharmD, BCPS Clinical Pharmacist [phone]  Diabetic Nephropathy  Diabetic nephropathy is kidney disease that is caused by diabetes (diabetes mellitus). Kidneys are organs that filter and clean blood and get rid of body waste products and extra fluid. Diabetes can cause gradual kidney damage over many years. Diabetic nephropathy that continues to get worse can lead to kidney failure. What are the causes? This condition is caused by kidney damage from diabetes that is not well controlled with treatment. Having high blood sugar (glucose) for a long time because of diabetes can damage blood vessels in the kidneys and cause them to thicken and become scarred. Those changes prevent the kidneys from functioning normally. What increases the risk? This condition is more likely to develop in people with diabetes who:  Have had diabetes for many years.  Have high blood pressure.  Have high blood glucose levels over a long period of time.  Have a family history of kidney disease.  Have a history of tobacco use.  Have certain genes that are passed from parent to child (inherited). What are the signs or symptoms? This condition may not cause symptoms at first. If you do have symptoms, they may include:  Swelling of your hands, feet, or ankles.  Weakness.  Poor appetite.  Nausea.  Confusion.  Tiredness (fatigue).  Trouble sleeping.  Dry, itchy skin. If nephropathy leads to kidney failure, symptoms may include:  Vomiting.  Shortness of breath.  Jerky movements that you cannot control (seizure).  Coma. How is this diagnosed? It is important to diagnose this condition before symptoms develop. You may be screened for diabetic nephropathy at a routine health care visit. Screening tests may include:  Urine tests. These may be done every year.  Urine collection over a 24-hour period to measure kidney function.  Blood tests to measure blood glucose  levels and kidney function.  Regular blood pressure monitoring. If your health care provider suspects diabetic nephropathy, he or she may:  Review your medical history and symptoms.  Do a physical exam.  Do an ultrasound of your kidneys.  Perform a procedure to take a sample of kidney tissue for testing (biopsy). How is this treated? The goal of treatment is to prevent or slow down any damage to your kidneys by managing your diabetes. To do this, it is important to control:  Your blood pressure. ? Your target blood pressure may vary depending on your medical conditions, your age, and other factors. ? To help control blood pressure, you may be prescribed medicines to lower your blood pressure (ACE inhibitors) or to help your body get rid of excess fluid (diuretics).  Your A1c (hemoglobin A1c) level. Generally, the goal of treatment is to maintain an A1c level of less than 7%.  Your blood glucose level.  Your blood lipids. If you have high cholesterol, you may need to take lipid-lowering drugs, such as statins. Other treatments may include:  Medicines, including insulin injections.  Lifestyle changes, such as losing weight, quitting smoking, or making changes to your diet. If your disease progresses to end-stage kidney failure, treatment may include:  Dialysis. This is a procedure to filter your blood with a machine.  Kidney transplant. Follow these instructions at home: Eating and drinking  Eat healthy foods, and eat healthy snacks between meals. Follow instructions from your health care provider about eating and drinking restrictions.  Limit your sodium (salt), protein, or fluid intake as directed.  If you drink alcohol: ? Limit how much you use to:  0-1 drink a day for nonpregnant women.  0-2 drinks a day for men. ? Be aware of how much alcohol is in your drink. In the [place] one drink equals one 12 oz bottle of beer (355 mL), one 5 oz glass of wine (148 mL), or one 1  oz glass of hard liquor (44 mL). Lifestyle  Maintain a healthy weight. Work with your health care provider to lose weight, if needed.  Do not use any products that contain nicotine or tobacco, such as cigarettes, e-cigarettes, and chewing tobacco. If you need help quitting, ask your health care provider.  Be physically active every day. Ask your health care provider what type of exercise is best for you.  Work with your health care provider to manage your blood pressure. General instructions  Follow your diabetes management plan as directed. ? Check your blood glucose levels as directed by your health care provider. ? Keep your blood glucose in your target range as directed by your health care provider. ? Have your A1c level checked two or more times a year, or as often as told by your health care provider.  Measure your blood pressure regularly at home, as told by your health care provider.  Take over-the-counter and prescription medicines only as  told by your health care provider. These include insulin and supplements.  Keep all follow-up visits and routine visits as told by your health care provider. This is important. Make sure you get screening tests as directed.      Where to find more information American Diabetes Association: www.diabetes.org Contact a health care provider if:  You have trouble keeping your blood glucose in your goal range.  Your blood glucose level is higher than 240 mg/dL (13.3 mmol/L) for 2 days in a row.  You have swelling in your hands, ankles, or feet.  You feel weak, tired, or dizzy.  You have sudden muscle tightening (spasms).  You have nausea or vomiting.  You feel tired all the time. Get help right away if:  You are very sleepy.  You faint.  You have: ? A seizure. ? Severe, painful muscle spasms. ? Shortness of breath. ? Chest pain. Summary  Diabetic nephropathy is kidney disease that is caused by diabetes (diabetes  mellitus).  Keep your blood sugar (glucose) in your target range as directed by your health care provider.  Work with your health care provider to manage your blood pressure.  Keep all follow-up visits and routine visits as told by your health care provider. This is important. Make sure you get screening tests as directed. This information is not intended to replace advice given to you by your health care provider. Make sure you discuss any questions you have with your health care provider. Document Revised: 03/27/2019 Document Reviewed: 03/27/2019 Elsevier Patient Education  [place].

## 2020-12-25 ENCOUNTER — Other Ambulatory Visit: Payer: Self-pay

## 2020-12-25 ENCOUNTER — Encounter: Payer: Self-pay | Admitting: Internal Medicine

## 2020-12-25 ENCOUNTER — Ambulatory Visit (INDEPENDENT_AMBULATORY_CARE_PROVIDER_SITE_OTHER): Payer: Medicare Other | Admitting: Internal Medicine

## 2020-12-25 VITALS — BP 142/78 | HR 67 | Temp 98.0°F | Ht 60.0 in | Wt 121.0 lb

## 2020-12-25 DIAGNOSIS — J449 Chronic obstructive pulmonary disease, unspecified: Secondary | ICD-10-CM | POA: Diagnosis not present

## 2020-12-25 DIAGNOSIS — E1122 Type 2 diabetes mellitus with diabetic chronic kidney disease: Secondary | ICD-10-CM

## 2020-12-25 DIAGNOSIS — Z1231 Encounter for screening mammogram for malignant neoplasm of breast: Secondary | ICD-10-CM

## 2020-12-25 DIAGNOSIS — Z794 Long term (current) use of insulin: Secondary | ICD-10-CM | POA: Diagnosis not present

## 2020-12-25 DIAGNOSIS — N184 Chronic kidney disease, stage 4 (severe): Secondary | ICD-10-CM

## 2020-12-25 DIAGNOSIS — M5417 Radiculopathy, lumbosacral region: Secondary | ICD-10-CM | POA: Diagnosis not present

## 2020-12-25 MED ORDER — GABAPENTIN 100 MG PO CAPS: 100.0000 mg | ORAL_CAPSULE | Freq: Every evening | ORAL | 1 refills | Status: DC

## 2020-12-25 MED ORDER — PROMETHAZINE-DM 6.25-15 MG/5ML PO SYRP: 5.0000 mL | ORAL_SOLUTION | Freq: Four times a day (QID) | ORAL | 0 refills | Status: DC | PRN

## 2020-12-25 NOTE — Progress Notes (Signed)
Date:  12/25/2020   Name:  Jill Hudson   DOB:  24-Apr-1935   MRN:  [id number]   Chief Complaint: Follow-up (Gabapentin/ pt like medication, pt stated its helping )  Back Pain This is a chronic problem. The problem has been gradually improving since onset. The pain is present in the lumbar spine. The pain radiates to the left foot. The pain is mild. Pertinent negatives include no chest pain, dysuria, fever, headaches or weight loss. Treatments tried: gabapentin has been very helpful. The treatment provided moderate relief.  Diabetes She presents for her follow-up diabetic visit. She has type 2 diabetes mellitus. Her disease course has been fluctuating. There are no hypoglycemic associated symptoms. Pertinent negatives for hypoglycemia include no headaches, nervousness/anxiousness or seizures. Associated symptoms include foot paresthesias. Pertinent negatives for diabetes include no chest pain, no fatigue, no foot ulcerations, no visual change and no weight loss. Her weight is stable. She monitors blood glucose at home 3-4 x per day. Her home blood glucose trend is fluctuating dramatically. An ACE inhibitor/angiotensin II receptor blocker is being taken.    Lab Results  Component Value Date   CREATININE 1.42 (H) 07/20/2020   BUN 44 (H) 07/20/2020   NA 136 07/20/2020   K 4.0 07/20/2020   CL 99 07/20/2020   CO2 25 07/20/2020   Lab Results  Component Value Date   CHOL 117 02/19/2020   HDL 56 02/19/2020   LDLCALC 54 02/19/2020   TRIG 34 02/19/2020   CHOLHDL 2.1 02/19/2020   Lab Results  Component Value Date   TSH 3.512 12/25/2019   Lab Results  Component Value Date   HGBA1C 7.7 09/30/2020   Lab Results  Component Value Date   WBC 8.1 07/20/2020   HGB 12.5 07/20/2020   HCT 36.9 07/20/2020   MCV 90.4 07/20/2020   PLT 228 07/20/2020   Lab Results  Component Value Date   ALT 50 (H) 02/21/2020   AST 33 02/21/2020   ALKPHOS 78 02/21/2020   BILITOT 0.7 02/21/2020      Review of Systems  Constitutional: Negative for chills, fatigue, fever and weight loss.  Respiratory: Negative for chest tightness and shortness of breath.   Cardiovascular: Positive for leg swelling. Negative for chest pain and palpitations.  Genitourinary: Negative for dysuria.  Musculoskeletal: Positive for back pain.  Neurological: Negative for seizures and headaches.  Psychiatric/Behavioral: Negative for sleep disturbance. The patient is not nervous/anxious.     Patient Active Problem List   Diagnosis Date Noted  . Mood disorder ([place]) 05/02/2020  . Chronic heart failure with preserved ejection fraction ([place]) 04/12/2020  . Diverticulosis of large intestine without perforation or abscess with bleeding 03/27/2020  . AF (paroxysmal atrial fibrillation) ([place])   . Thrush   . COPD (chronic obstructive pulmonary disease) with chronic bronchitis ([place])   . CKD (chronic kidney disease), stage IIIa 02/18/2020  . Malnutrition of mild degree ([place]) 01/08/2020  . Acute blood loss anemia   . Diverticulosis of large intestine with hemorrhage   . GI bleeding 12/25/2019  . Rectal bleeding   . Age-related macular degeneration, dry, left eye 06/21/2019  . Age-related macular degeneration, wet, right eye ([place]) 06/21/2019  . Moderate nonproliferative diabetic retinopathy associated with type 2 diabetes mellitus ([place]) 06/21/2019  . Lumbosacral radiculopathy at L4 05/01/2019  . Underweight 05/01/2019  . Encounter for long-term (current) use of aspirin 10/31/2018  . Encounter for long-term (current) use of antiplatelets/antithrombotics 10/31/2018  . Long term current use  of oral hypoglycemic drug 10/31/2018  . Encounter for long-term (current) use of insulin ([place]) 10/31/2018  . GIB (gastrointestinal bleeding) 08/11/2018  . Leg pain 07/03/2017  . Carpal tunnel syndrome on both sides 05/05/2017  . Myalgia due to HMG CoA reductase inhibitor 05/05/2017  . Adverse effect of antihyperlipidemic and  antiarteriosclerotic drugs, initial encounter 05/05/2017  . History of CVA (cerebrovascular accident) 02/15/2017  . Degenerative disc disease, lumbar 12/21/2016  . Atherosclerosis of native arteries of extremity with intermittent claudication ([place]) 12/20/2016  . Bilateral carotid artery stenosis 12/20/2016  . Occlusion and stenosis of bilateral carotid arteries 12/20/2016  . Hip bursitis 05/18/2016  . Elevated TSH 01/18/2016  . CKD stage 3 due to type 2 diabetes mellitus ([place]) 01/16/2016  . Senile ecchymosis 01/16/2016  . Type II diabetes mellitus with renal manifestations ([place]) 09/16/2015  . GERD (gastroesophageal reflux disease) 07/24/2015  . PVD (peripheral vascular disease) ([place]) 05/17/2015  . Neoplasm of uncertain behavior of skin 05/17/2015  . Retinopathy, diabetic, proliferative ([place]) 04/11/2015  . Hyperlipidemia 04/11/2015  . Essential hypertension 04/11/2015  . Generalized OA 04/11/2015  . Proliferative diabetic retinopathy(362.02) 04/11/2015  . Arteriosclerosis of coronary artery 05/29/2013  . Hypertensive heart disease without CHF 05/29/2013    Allergies  Allergen Reactions  . Saxagliptin Diarrhea  . Epinephrine Other (See Comments)    Patient does not remember what happens when she uses this  . Atorvastatin Other (See Comments)    Muscle aches  . Codeine Other (See Comments)    Upset stomach  . Ezetimibe Other (See Comments)    Myalgias(ZETIA)  . Limonene Rash    Patient does not recall this reaction  . Nitrofurantoin Rash and Other (See Comments)    Pruitus  . Sulfa Antibiotics Rash and Other (See Comments)    Sore mouth     Past Surgical History:  Procedure Laterality Date  . CARDIAC CATHETERIZATION  1998   40% LM, 95% Ramus interm  . CATARACT EXTRACTION, BILATERAL    . COLONOSCOPY WITH PROPOFOL N/A 08/12/2018   Procedure: COLONOSCOPY WITH PROPOFOL;  Surgeon: Lucilla Lame, MD;  Location: [facility] ENDOSCOPY;  Service: Endoscopy;  Laterality: N/A;  .  ENDARTERECTOMY FEMORAL Left 10/12/2018   Procedure: ENDARTERECTOMY FEMORAL;  Surgeon: Katha Cabal, MD;  Location: [facility] ORS;  Service: Vascular;  Laterality: Left;  angioplasty and left SFA stent placement  . EYE SURGERY Bilateral    cataract extractions  . LOWER EXTREMITY ANGIOGRAPHY Left 08/23/2017   Procedure: Lower Extremity Angiography;  Surgeon: Algernon Huxley, MD;  Location: [place] CV LAB;  Service: Cardiovascular;  Laterality: Left;  . LOWER EXTREMITY ANGIOGRAPHY Left 07/26/2018   Procedure: LOWER EXTREMITY ANGIOGRAPHY;  Surgeon: Katha Cabal, MD;  Location: [place] CV LAB;  Service: Cardiovascular;  Laterality: Left;  . LOWER EXTREMITY ANGIOGRAPHY Left 09/16/2018   Procedure: LOWER EXTREMITY ANGIOGRAPHY;  Surgeon: Katha Cabal, MD;  Location: [place] [facility];  Service: Cardiovascular;  Laterality: Left;  . PTCA  08/2013   Left common iliac  . PTCA  12/2012   left ext iliac  . RIGHT HEART CATH N/A 02/23/2020   Procedure: RIGHT HEART CATH;  Surgeon: Minna Merritts, MD;  Location: [place] [facility];  Service: Cardiovascular;  Laterality: N/A;  . TUBAL LIGATION      Social History   Tobacco Use  . Smoking status: Former Smoker    Packs/day: 2.00    Years: 37.00    Pack years: 74.00    Types: Cigarettes  Quit date: 67    Years since quitting: 42.1  . Smokeless tobacco: Never Used  . Tobacco comment: smoking cessation materials not required  Vaping Use  . Vaping Use: Never used  Substance Use Topics  . Alcohol use: No    Alcohol/week: 0.0 standard drinks  . Drug use: No     Medication list has been reviewed and updated.  Current Meds  Medication Sig  . acetaminophen (TYLENOL) 650 MG CR tablet Take 1,300 mg by mouth every 8 (eight) hours as needed for pain.  Marland Kitchen amLODipine (NORVASC) 5 MG tablet Take 5 mg by mouth in the morning and at bedtime.  . B-D ULTRAFINE III SHORT PEN 31G X 8 MM MISC USE AS DIRECTED  . cloNIDine (CATAPRES)  0.2 MG tablet Take 1 tablet (0.2 mg total) by mouth 2 (two) times daily. Please check blood pressure prior to taking medication.  Only take if blood pressure is greater than 502 systolic. (Patient taking differently: Take 0.2 mg by mouth as needed. Please check blood pressure prior to taking medication.  Only take if blood pressure is greater than 774 systolic.)  . Cyanocobalamin 1000 MCG TBCR Take 1,000 mcg by mouth daily.  Marland Kitchen ELIQUIS 2.5 MG TABS tablet TAKE 1 TABLET(2.5 MG) BY MOUTH TWICE DAILY  . feeding supplement, GLUCERNA SHAKE, (GLUCERNA SHAKE) LIQD Take 237 mLs by mouth 3 (three) times daily between meals.  . ferrous sulfate 325 (65 FE) MG tablet Take 325 mg by mouth daily with breakfast.  . insulin aspart (NOVOLOG FLEXPEN) 100 UNIT/ML FlexPen Inject 3 Units into the skin 3 (three) times daily with meals. At lunch (Patient taking differently: Inject 20 Units into the skin daily. At breakfast- sliding scale)  . Insulin Glargine (BASAGLAR KWIKPEN) 100 UNIT/ML INJECT 12 UNITS UNDER THE SKIN DAILY  . losartan (COZAAR) 25 MG tablet Take 25 mg by mouth 2 (two) times daily. Unsure of dosage  . melatonin 5 MG TABS Take 0.5 tablets (2.5 mg total) by mouth at bedtime as needed (sleep).  . Multiple Vitamins-Minerals (PRESERVISION AREDS PO) Take 1 capsule by mouth 2 (two) times daily.   . rosuvastatin (CRESTOR) 5 MG tablet Take 5 mg by mouth every other day. Mon, Wed, Fri and Sat.  . torsemide (DEMADEX) 20 MG tablet Take 2 tablets (40 mg total) by mouth daily. (Patient taking differently: Take 40 mg by mouth 2 (two) times daily. 1 tablet in AM 1 tablet in PM)  . [DISCONTINUED] gabapentin (NEURONTIN) 100 MG capsule Take 1 capsule (100 mg total) by mouth at bedtime. (Patient taking differently: Take 100 mg by mouth at bedtime.)  . [DISCONTINUED] promethazine-dextromethorphan (PROMETHAZINE-DM) 6.25-15 MG/5ML syrup Take 5 mLs by mouth 4 (four) times daily as needed for cough.    PHQ 2/9 Scores 12/25/2020  10/08/2020 09/04/2020 09/04/2020  PHQ - 2 Score 0 3 0 0  PHQ- 9 Score 4 9 - -    GAD 7 : Generalized Anxiety Score 12/25/2020 10/08/2020 08/07/2020 05/06/2020  Nervous, Anxious, on Edge 0 2 0 0  Control/stop worrying 0 2 0 0  Worry too much - different things 0 2 0 0  Trouble relaxing 0 0 0 0  Restless 0 0 0 0  Easily annoyed or irritable 0 0 2 0  Afraid - awful might happen 0 0 0 0  Total GAD 7 Score 0 6 2 0  Anxiety Difficulty - - Not difficult at all Not difficult at all    BP Readings from Last 3  Encounters:  12/25/20 (!) 142/78  10/08/20 (!) 132/40  08/07/20 106/68    Physical Exam Vitals and nursing note reviewed.  Constitutional:      General: She is not in acute distress.    Appearance: Normal appearance. She is well-developed.  HENT:     Head: Normocephalic and atraumatic.  Cardiovascular:     Rate and Rhythm: Normal rate and regular rhythm.  Pulmonary:     Effort: Pulmonary effort is normal. No respiratory distress.     Breath sounds: No wheezing or rhonchi.  Musculoskeletal:     Cervical back: Normal range of motion.     Right lower leg: Edema present.     Left lower leg: Edema present.  Skin:    General: Skin is warm and dry.     Findings: No rash.  Neurological:     Mental Status: She is alert and oriented to person, place, and time.  Psychiatric:        Mood and Affect: Mood normal.        Behavior: Behavior normal.     Wt Readings from Last 3 Encounters:  12/25/20 121 lb (54.9 kg)  10/08/20 120 lb (54.4 kg)  08/07/20 119 lb (54 kg)    BP (!) 142/78   Pulse 67   Temp 98 F (36.7 C) (Oral)   Ht 5' (1.524 m)   Wt 121 lb (54.9 kg)   SpO2 97%   BMI 23.63 kg/m   Assessment and Plan: 1. Lumbosacral radiculopathy at L4 Symptoms improved on gabapentin at bedtime - will continue Does not appear to be too sedating or causing an increase in daytime somnolence - gabapentin (NEURONTIN) 100 MG capsule; Take 1 capsule (100 mg total) by mouth at  bedtime.  Dispense: 90 capsule; Refill: 1  2. COPD (chronic obstructive pulmonary disease) with chronic bronchitis (HCC) With chronic cough - may continue PRN use of syrup - promethazine-dextromethorphan (PROMETHAZINE-DM) 6.25-15 MG/5ML syrup; Take 5 mLs by mouth 4 (four) times daily as needed for cough.  Dispense: 180 mL; Refill: 0  3. Type 2 diabetes mellitus with stage 4 chronic kidney disease, with long-term current use of insulin ([place]) Managed by Dr. Honor Junes Recent A1C stable - Comprehensive metabolic panel - CBC with Differential/Platelet - TSH  4. Encounter for screening mammogram for breast cancer Overdue for screening - MM 3D SCREEN BREAST BILATERAL; Future   Partially dictated using Editor, commissioning. Any errors are unintentional.  [name], MD [facility]  12/25/2020

## 2020-12-26 LAB — CBC WITH DIFFERENTIAL/PLATELET
Basophils Absolute: 0.1 10*3/uL (ref 0.0–0.2)
Basos: 1 %
EOS (ABSOLUTE): 0.3 10*3/uL (ref 0.0–0.4)
Eos: 4 %
Hematocrit: 33.3 % — ABNORMAL LOW (ref 34.0–46.6)
Hemoglobin: 10.7 g/dL — ABNORMAL LOW (ref 11.1–15.9)
Immature Grans (Abs): 0 10*3/uL (ref 0.0–0.1)
Immature Granulocytes: 0 %
Lymphocytes Absolute: 0.9 10*3/uL (ref 0.7–3.1)
Lymphs: 11 %
MCH: 29.5 pg (ref 26.6–33.0)
MCHC: 32.1 g/dL (ref 31.5–35.7)
MCV: 92 fL (ref 79–97)
Monocytes Absolute: 0.9 10*3/uL (ref 0.1–0.9)
Monocytes: 10 %
Neutrophils Absolute: 6.2 10*3/uL (ref 1.4–7.0)
Neutrophils: 74 %
Platelets: 251 10*3/uL (ref 150–450)
RBC: 3.63 x10E6/uL — ABNORMAL LOW (ref 3.77–5.28)
RDW: 11.5 % — ABNORMAL LOW (ref 11.7–15.4)
WBC: 8.3 10*3/uL (ref 3.4–10.8)

## 2020-12-26 LAB — COMPREHENSIVE METABOLIC PANEL
ALT: 25 IU/L (ref 0–32)
AST: 23 IU/L (ref 0–40)
Albumin/Globulin Ratio: 1.6 (ref 1.2–2.2)
Albumin: 3.9 g/dL (ref 3.6–4.6)
Alkaline Phosphatase: 107 IU/L (ref 44–121)
BUN/Creatinine Ratio: 21 (ref 12–28)
BUN: 35 mg/dL — ABNORMAL HIGH (ref 8–27)
Bilirubin Total: <0.2 mg/dL (ref 0.0–1.2)
CO2: 20 mmol/L (ref 20–29)
Calcium: 9.5 mg/dL (ref 8.7–10.3)
Chloride: 96 mmol/L (ref 96–106)
Creatinine, Ser: 1.65 mg/dL — ABNORMAL HIGH (ref 0.57–1.00)
Globulin, Total: 2.4 g/dL (ref 1.5–4.5)
Glucose: 299 mg/dL — ABNORMAL HIGH (ref 65–99)
Potassium: 4.9 mmol/L (ref 3.5–5.2)
Sodium: 132 mmol/L — ABNORMAL LOW (ref 134–144)
Total Protein: 6.3 g/dL (ref 6.0–8.5)
eGFR: 30 mL/min/{1.73_m2} — ABNORMAL LOW (ref >59)

## 2020-12-26 LAB — TSH: TSH: 3.72 u[IU]/mL (ref 0.450–4.500)

## 2020-12-30 DIAGNOSIS — H353211 Exudative age-related macular degeneration, right eye, with active choroidal neovascularization: Secondary | ICD-10-CM | POA: Diagnosis not present

## 2021-01-01 ENCOUNTER — Telehealth: Payer: Self-pay | Admitting: General Practice

## 2021-01-01 NOTE — Telephone Encounter (Signed)
  Chronic Care Management   Outreach Note  01/01/2021 Name: Jill Hudson MRN: [id number] DOB: [birth year]  Referred by: [name], MD Reason for referral : Appointment (RNCM: Follow up call for Chronic Disease Management and Care Coordination Needs)   An unsuccessful telephone outreach was attempted today. The patient was referred to the case management team for assistance with care management and care coordination.   Follow Up Plan: A HIPAA compliant phone message was left for the patient providing contact information and requesting a return call.   Noreene Larsson RN, MSN, [facility] Mobile: [phone]

## 2021-01-06 DIAGNOSIS — I2510 Atherosclerotic heart disease of native coronary artery without angina pectoris: Secondary | ICD-10-CM | POA: Diagnosis not present

## 2021-01-06 DIAGNOSIS — I5032 Chronic diastolic (congestive) heart failure: Secondary | ICD-10-CM | POA: Diagnosis not present

## 2021-01-06 DIAGNOSIS — I10 Essential (primary) hypertension: Secondary | ICD-10-CM | POA: Diagnosis not present

## 2021-01-06 DIAGNOSIS — E785 Hyperlipidemia, unspecified: Secondary | ICD-10-CM | POA: Diagnosis not present

## 2021-01-06 DIAGNOSIS — I480 Paroxysmal atrial fibrillation: Secondary | ICD-10-CM | POA: Diagnosis not present

## 2021-01-08 DIAGNOSIS — M62830 Muscle spasm of back: Secondary | ICD-10-CM | POA: Diagnosis not present

## 2021-01-08 DIAGNOSIS — M5416 Radiculopathy, lumbar region: Secondary | ICD-10-CM | POA: Diagnosis not present

## 2021-01-08 DIAGNOSIS — M5136 Other intervertebral disc degeneration, lumbar region: Secondary | ICD-10-CM | POA: Diagnosis not present

## 2021-01-08 DIAGNOSIS — M5126 Other intervertebral disc displacement, lumbar region: Secondary | ICD-10-CM | POA: Diagnosis not present

## 2021-01-13 DIAGNOSIS — R8290 Unspecified abnormal findings in urine: Secondary | ICD-10-CM | POA: Diagnosis not present

## 2021-01-13 DIAGNOSIS — N1832 Chronic kidney disease, stage 3b: Secondary | ICD-10-CM | POA: Diagnosis not present

## 2021-01-15 DIAGNOSIS — M5116 Intervertebral disc disorders with radiculopathy, lumbar region: Secondary | ICD-10-CM | POA: Diagnosis not present

## 2021-01-15 DIAGNOSIS — E1159 Type 2 diabetes mellitus with other circulatory complications: Secondary | ICD-10-CM | POA: Diagnosis not present

## 2021-01-15 DIAGNOSIS — Z8673 Personal history of transient ischemic attack (TIA), and cerebral infarction without residual deficits: Secondary | ICD-10-CM | POA: Diagnosis not present

## 2021-01-15 DIAGNOSIS — Z981 Arthrodesis status: Secondary | ICD-10-CM | POA: Diagnosis not present

## 2021-01-15 DIAGNOSIS — E7800 Pure hypercholesterolemia, unspecified: Secondary | ICD-10-CM | POA: Diagnosis not present

## 2021-01-15 DIAGNOSIS — I152 Hypertension secondary to endocrine disorders: Secondary | ICD-10-CM | POA: Diagnosis not present

## 2021-01-15 DIAGNOSIS — M545 Low back pain, unspecified: Secondary | ICD-10-CM | POA: Diagnosis not present

## 2021-01-15 DIAGNOSIS — Z79899 Other long term (current) drug therapy: Secondary | ICD-10-CM | POA: Diagnosis not present

## 2021-01-15 DIAGNOSIS — Z87891 Personal history of nicotine dependence: Secondary | ICD-10-CM | POA: Diagnosis not present

## 2021-01-15 DIAGNOSIS — G8929 Other chronic pain: Secondary | ICD-10-CM | POA: Diagnosis not present

## 2021-01-15 DIAGNOSIS — Z7901 Long term (current) use of anticoagulants: Secondary | ICD-10-CM | POA: Diagnosis not present

## 2021-01-20 DIAGNOSIS — L578 Other skin changes due to chronic exposure to nonionizing radiation: Secondary | ICD-10-CM | POA: Diagnosis not present

## 2021-01-20 DIAGNOSIS — Z859 Personal history of malignant neoplasm, unspecified: Secondary | ICD-10-CM | POA: Diagnosis not present

## 2021-01-21 DIAGNOSIS — Z87891 Personal history of nicotine dependence: Secondary | ICD-10-CM | POA: Diagnosis not present

## 2021-01-21 DIAGNOSIS — I152 Hypertension secondary to endocrine disorders: Secondary | ICD-10-CM | POA: Diagnosis not present

## 2021-01-21 DIAGNOSIS — E7800 Pure hypercholesterolemia, unspecified: Secondary | ICD-10-CM | POA: Diagnosis not present

## 2021-01-21 DIAGNOSIS — Z79899 Other long term (current) drug therapy: Secondary | ICD-10-CM | POA: Diagnosis not present

## 2021-01-21 DIAGNOSIS — M5116 Intervertebral disc disorders with radiculopathy, lumbar region: Secondary | ICD-10-CM | POA: Diagnosis not present

## 2021-01-21 DIAGNOSIS — Z7901 Long term (current) use of anticoagulants: Secondary | ICD-10-CM | POA: Diagnosis not present

## 2021-01-21 DIAGNOSIS — Z981 Arthrodesis status: Secondary | ICD-10-CM | POA: Diagnosis not present

## 2021-01-21 DIAGNOSIS — E1159 Type 2 diabetes mellitus with other circulatory complications: Secondary | ICD-10-CM | POA: Diagnosis not present

## 2021-01-21 DIAGNOSIS — Z8673 Personal history of transient ischemic attack (TIA), and cerebral infarction without residual deficits: Secondary | ICD-10-CM | POA: Diagnosis not present

## 2021-01-21 DIAGNOSIS — M545 Low back pain, unspecified: Secondary | ICD-10-CM | POA: Diagnosis not present

## 2021-01-21 DIAGNOSIS — G8929 Other chronic pain: Secondary | ICD-10-CM | POA: Diagnosis not present

## 2021-01-23 DIAGNOSIS — G8929 Other chronic pain: Secondary | ICD-10-CM | POA: Diagnosis not present

## 2021-01-23 DIAGNOSIS — Z981 Arthrodesis status: Secondary | ICD-10-CM | POA: Diagnosis not present

## 2021-01-23 DIAGNOSIS — E1159 Type 2 diabetes mellitus with other circulatory complications: Secondary | ICD-10-CM | POA: Diagnosis not present

## 2021-01-23 DIAGNOSIS — Z79899 Other long term (current) drug therapy: Secondary | ICD-10-CM | POA: Diagnosis not present

## 2021-01-23 DIAGNOSIS — M545 Low back pain, unspecified: Secondary | ICD-10-CM | POA: Diagnosis not present

## 2021-01-23 DIAGNOSIS — Z8673 Personal history of transient ischemic attack (TIA), and cerebral infarction without residual deficits: Secondary | ICD-10-CM | POA: Diagnosis not present

## 2021-01-23 DIAGNOSIS — I152 Hypertension secondary to endocrine disorders: Secondary | ICD-10-CM | POA: Diagnosis not present

## 2021-01-23 DIAGNOSIS — Z7901 Long term (current) use of anticoagulants: Secondary | ICD-10-CM | POA: Diagnosis not present

## 2021-01-23 DIAGNOSIS — Z87891 Personal history of nicotine dependence: Secondary | ICD-10-CM | POA: Diagnosis not present

## 2021-01-23 DIAGNOSIS — M5116 Intervertebral disc disorders with radiculopathy, lumbar region: Secondary | ICD-10-CM | POA: Diagnosis not present

## 2021-01-23 DIAGNOSIS — E7800 Pure hypercholesterolemia, unspecified: Secondary | ICD-10-CM | POA: Diagnosis not present

## 2021-01-28 DIAGNOSIS — Z981 Arthrodesis status: Secondary | ICD-10-CM | POA: Diagnosis not present

## 2021-01-28 DIAGNOSIS — Z87891 Personal history of nicotine dependence: Secondary | ICD-10-CM | POA: Diagnosis not present

## 2021-01-28 DIAGNOSIS — Z7901 Long term (current) use of anticoagulants: Secondary | ICD-10-CM | POA: Diagnosis not present

## 2021-01-28 DIAGNOSIS — Z79899 Other long term (current) drug therapy: Secondary | ICD-10-CM | POA: Diagnosis not present

## 2021-01-28 DIAGNOSIS — M545 Low back pain, unspecified: Secondary | ICD-10-CM | POA: Diagnosis not present

## 2021-01-28 DIAGNOSIS — E7800 Pure hypercholesterolemia, unspecified: Secondary | ICD-10-CM | POA: Diagnosis not present

## 2021-01-28 DIAGNOSIS — Z8673 Personal history of transient ischemic attack (TIA), and cerebral infarction without residual deficits: Secondary | ICD-10-CM | POA: Diagnosis not present

## 2021-01-28 DIAGNOSIS — E1159 Type 2 diabetes mellitus with other circulatory complications: Secondary | ICD-10-CM | POA: Diagnosis not present

## 2021-01-28 DIAGNOSIS — M5116 Intervertebral disc disorders with radiculopathy, lumbar region: Secondary | ICD-10-CM | POA: Diagnosis not present

## 2021-01-28 DIAGNOSIS — I152 Hypertension secondary to endocrine disorders: Secondary | ICD-10-CM | POA: Diagnosis not present

## 2021-01-28 DIAGNOSIS — G8929 Other chronic pain: Secondary | ICD-10-CM | POA: Diagnosis not present

## 2021-01-30 DIAGNOSIS — M5116 Intervertebral disc disorders with radiculopathy, lumbar region: Secondary | ICD-10-CM | POA: Diagnosis not present

## 2021-01-30 DIAGNOSIS — Z8673 Personal history of transient ischemic attack (TIA), and cerebral infarction without residual deficits: Secondary | ICD-10-CM | POA: Diagnosis not present

## 2021-01-30 DIAGNOSIS — E7800 Pure hypercholesterolemia, unspecified: Secondary | ICD-10-CM | POA: Diagnosis not present

## 2021-01-30 DIAGNOSIS — Z7901 Long term (current) use of anticoagulants: Secondary | ICD-10-CM | POA: Diagnosis not present

## 2021-01-30 DIAGNOSIS — E1159 Type 2 diabetes mellitus with other circulatory complications: Secondary | ICD-10-CM | POA: Diagnosis not present

## 2021-01-30 DIAGNOSIS — I152 Hypertension secondary to endocrine disorders: Secondary | ICD-10-CM | POA: Diagnosis not present

## 2021-01-30 DIAGNOSIS — Z79899 Other long term (current) drug therapy: Secondary | ICD-10-CM | POA: Diagnosis not present

## 2021-01-30 DIAGNOSIS — Z981 Arthrodesis status: Secondary | ICD-10-CM | POA: Diagnosis not present

## 2021-01-30 DIAGNOSIS — M545 Low back pain, unspecified: Secondary | ICD-10-CM | POA: Diagnosis not present

## 2021-01-30 DIAGNOSIS — G8929 Other chronic pain: Secondary | ICD-10-CM | POA: Diagnosis not present

## 2021-01-30 DIAGNOSIS — Z87891 Personal history of nicotine dependence: Secondary | ICD-10-CM | POA: Diagnosis not present

## 2021-02-04 ENCOUNTER — Telehealth: Payer: Self-pay

## 2021-02-04 NOTE — Chronic Care Management (AMB) (Signed)
Chronic Care Management Pharmacy Assistant   Name: Jill Hudson  MRN: [id number] DOB: January 09, 1935  Reason for Encounter: Diabetes Mellitus Disease State Call   Recent office visits:  12/25/2020- Jill Maidens, MD- Follow up appointment Lumbosacral radiculopathy at L4  Recent consult visits:  No visits noted  Hospital visits:  None in previous 6 months  Medications: Outpatient Encounter Medications as of 02/04/2021  Medication Sig Note  . acetaminophen (TYLENOL) 650 MG CR tablet Take 1,300 mg by mouth every 8 (eight) hours as needed for pain.   Marland Kitchen albuterol (VENTOLIN HFA) 108 (90 Base) MCG/ACT inhaler Inhale 2 puffs into the lungs every 6 (six) hours as needed for wheezing or shortness of breath. (Patient not taking: Reported on 12/25/2020)   . amLODipine (NORVASC) 5 MG tablet Take 5 mg by mouth in the morning and at bedtime.   . B-D ULTRAFINE III SHORT PEN 31G X 8 MM MISC USE AS DIRECTED   . cloNIDine (CATAPRES) 0.2 MG tablet Take 1 tablet (0.2 mg total) by mouth 2 (two) times daily. Please check blood pressure prior to taking medication.  Only take if blood pressure is greater than 956 systolic. (Patient taking differently: Take 0.2 mg by mouth as needed. Please check blood pressure prior to taking medication.  Only take if blood pressure is greater than 213 systolic.)   . Cyanocobalamin 1000 MCG TBCR Take 1,000 mcg by mouth daily.   Marland Kitchen ELIQUIS 2.5 MG TABS tablet TAKE 1 TABLET(2.5 MG) BY MOUTH TWICE DAILY   . feeding supplement, GLUCERNA SHAKE, (GLUCERNA SHAKE) LIQD Take 237 mLs by mouth 3 (three) times daily between meals.   . ferrous sulfate 325 (65 FE) MG tablet Take 325 mg by mouth daily with breakfast.   . gabapentin (NEURONTIN) 100 MG capsule Take 1 capsule (100 mg total) by mouth at bedtime.   . insulin aspart (NOVOLOG FLEXPEN) 100 UNIT/ML FlexPen Inject 3 Units into the skin 3 (three) times daily with meals. At lunch (Patient taking differently: Inject 20 Units into the skin  daily. At breakfast- sliding scale) 02/28/2020: 3 units TIDAC  . Insulin Glargine (BASAGLAR KWIKPEN) 100 UNIT/ML INJECT 12 UNITS UNDER THE SKIN DAILY   . losartan (COZAAR) 25 MG tablet Take 25 mg by mouth 2 (two) times daily. Unsure of dosage   . melatonin 5 MG TABS Take 0.5 tablets (2.5 mg total) by mouth at bedtime as needed (sleep).   . Multiple Vitamins-Minerals (PRESERVISION AREDS PO) Take 1 capsule by mouth 2 (two) times daily.    . promethazine-dextromethorphan (PROMETHAZINE-DM) 6.25-15 MG/5ML syrup Take 5 mLs by mouth 4 (four) times daily as needed for cough.   . rosuvastatin (CRESTOR) 5 MG tablet Take 5 mg by mouth every other day. Mon, Wed, Fri and Sat.   . torsemide (DEMADEX) 20 MG tablet Take 2 tablets (40 mg total) by mouth daily. (Patient taking differently: Take 40 mg by mouth 2 (two) times daily. 1 tablet in AM 1 tablet in PM)    No facility-administered encounter medications on file as of 02/04/2021.   Recent Relevant Labs: Lab Results  Component Value Date/Time   HGBA1C 7.7 09/30/2020 12:00 AM   HGBA1C 8.2 06/14/2020 12:00 AM    Kidney Function Lab Results  Component Value Date/Time   CREATININE 1.65 (H) 12/25/2020 02:27 PM   CREATININE 1.42 (H) 07/20/2020 05:27 PM   CREATININE 1.10 05/29/2014 12:02 PM   CREATININE 0.99 09/09/2013 04:13 AM   GFRNONAA 34 (L) 07/20/2020 05:27 PM  GFRNONAA 48 (L) 05/29/2014 12:02 PM   GFRAA 39 (L) 07/20/2020 05:27 PM   GFRAA 56 (L) 05/29/2014 12:02 PM   Call was completed with answers from both the patient and patient's daughter. Patient was in bed for the beginning of the call, but woke up to answer some of my questions.  Current antihyperglycemic regimen:  Novolog 100 Unit/ mL- Inject 3 Units into the skin 3 times daily with meals Patient taking differently: 3 Units twice a day  Basaglar 100 Unit/ mL- INJECT 12 UNITS UNDER THE SKIN DAILY Patient taking differently: 4 Units daily  Gabapentin 100 mg- one capsule at bedtime Patient  taking differently: two capsules at bedtime  What recent interventions/DTPs have been made to improve glycemic control:  No recent interventions   Have there been any recent hospitalizations or ED visits since last visit with CPP? No   Patient denies hypoglycemic symptoms, including Pale, Sweaty, Shaky, Hungry, Nervous/irritable and Vision changes  Patient reports hyperglycemic symptoms, including weakness when she wakes up in the morning.   How often are you checking your blood sugar?  Patient's daughter stated that the average amount of times that her mother checks he blood sugar a day is 9 times  What are your blood sugars ranging?  o Fasting: 283, 268 o Before meals: 177,158 ( Midday) o After meals: 283,279, 228,300,222 o Bedtime: 170,017  Patient's daughter stated that her blood sugars have not been this high in a while, but she believes that her mom is intentionally raising her BS before going to sleep. She said her mom told her that it helps her sleep better when she eats something sweet before bed. I asked [name] what kind of things she eats before bed and she stated just supper. Her daughter let me know that she also indulges in apples, snacks, and sugar free candies before bed.   During the week, how often does your blood glucose drop below 70? Never  Are you checking your feet daily/regularly?  Patient's daughter stated that [name] is always checking her feet.  Adherence Review: Is the patient currently on a STATIN medication? Yes   Is the patient currently on ACE/ARB medication? Yes   Does the patient have >5 day gap between last estimated fill dates? No Mail Delivery  Star Rating Drugs: Losartan 5mg - 90DS last filled 11/29/20 Rosuvastatin 5 mg- 84DS last filled 01/20/21  Jill Hudson CPA, CMA

## 2021-02-05 DIAGNOSIS — Z87891 Personal history of nicotine dependence: Secondary | ICD-10-CM | POA: Diagnosis not present

## 2021-02-05 DIAGNOSIS — G8929 Other chronic pain: Secondary | ICD-10-CM | POA: Diagnosis not present

## 2021-02-05 DIAGNOSIS — E7800 Pure hypercholesterolemia, unspecified: Secondary | ICD-10-CM | POA: Diagnosis not present

## 2021-02-05 DIAGNOSIS — Z79899 Other long term (current) drug therapy: Secondary | ICD-10-CM | POA: Diagnosis not present

## 2021-02-05 DIAGNOSIS — E1159 Type 2 diabetes mellitus with other circulatory complications: Secondary | ICD-10-CM | POA: Diagnosis not present

## 2021-02-05 DIAGNOSIS — M545 Low back pain, unspecified: Secondary | ICD-10-CM | POA: Diagnosis not present

## 2021-02-05 DIAGNOSIS — M5116 Intervertebral disc disorders with radiculopathy, lumbar region: Secondary | ICD-10-CM | POA: Diagnosis not present

## 2021-02-05 DIAGNOSIS — I152 Hypertension secondary to endocrine disorders: Secondary | ICD-10-CM | POA: Diagnosis not present

## 2021-02-05 DIAGNOSIS — Z981 Arthrodesis status: Secondary | ICD-10-CM | POA: Diagnosis not present

## 2021-02-05 DIAGNOSIS — Z8673 Personal history of transient ischemic attack (TIA), and cerebral infarction without residual deficits: Secondary | ICD-10-CM | POA: Diagnosis not present

## 2021-02-05 DIAGNOSIS — Z7901 Long term (current) use of anticoagulants: Secondary | ICD-10-CM | POA: Diagnosis not present

## 2021-02-06 DIAGNOSIS — E118 Type 2 diabetes mellitus with unspecified complications: Secondary | ICD-10-CM | POA: Diagnosis not present

## 2021-02-06 DIAGNOSIS — Z794 Long term (current) use of insulin: Secondary | ICD-10-CM | POA: Diagnosis not present

## 2021-02-10 DIAGNOSIS — H353221 Exudative age-related macular degeneration, left eye, with active choroidal neovascularization: Secondary | ICD-10-CM | POA: Diagnosis not present

## 2021-02-10 DIAGNOSIS — H353211 Exudative age-related macular degeneration, right eye, with active choroidal neovascularization: Secondary | ICD-10-CM | POA: Diagnosis not present

## 2021-02-10 DIAGNOSIS — I152 Hypertension secondary to endocrine disorders: Secondary | ICD-10-CM | POA: Diagnosis not present

## 2021-02-10 DIAGNOSIS — E1142 Type 2 diabetes mellitus with diabetic polyneuropathy: Secondary | ICD-10-CM | POA: Diagnosis not present

## 2021-02-10 DIAGNOSIS — E1169 Type 2 diabetes mellitus with other specified complication: Secondary | ICD-10-CM | POA: Diagnosis not present

## 2021-02-10 DIAGNOSIS — E1159 Type 2 diabetes mellitus with other circulatory complications: Secondary | ICD-10-CM | POA: Diagnosis not present

## 2021-02-10 DIAGNOSIS — E785 Hyperlipidemia, unspecified: Secondary | ICD-10-CM | POA: Diagnosis not present

## 2021-02-10 DIAGNOSIS — Z794 Long term (current) use of insulin: Secondary | ICD-10-CM | POA: Diagnosis not present

## 2021-02-10 LAB — HEMOGLOBIN A1C: Hemoglobin A1C: 8.8

## 2021-02-10 LAB — LIPID PANEL
Cholesterol: 164 (ref 0–200)
HDL: 48 (ref 35–70)
LDL Cholesterol: 88
Triglycerides: 137 (ref 40–160)

## 2021-02-11 DIAGNOSIS — M545 Low back pain, unspecified: Secondary | ICD-10-CM | POA: Diagnosis not present

## 2021-02-11 DIAGNOSIS — Z981 Arthrodesis status: Secondary | ICD-10-CM | POA: Diagnosis not present

## 2021-02-11 DIAGNOSIS — I152 Hypertension secondary to endocrine disorders: Secondary | ICD-10-CM | POA: Diagnosis not present

## 2021-02-11 DIAGNOSIS — Z79899 Other long term (current) drug therapy: Secondary | ICD-10-CM | POA: Diagnosis not present

## 2021-02-11 DIAGNOSIS — Z8673 Personal history of transient ischemic attack (TIA), and cerebral infarction without residual deficits: Secondary | ICD-10-CM | POA: Diagnosis not present

## 2021-02-11 DIAGNOSIS — Z7901 Long term (current) use of anticoagulants: Secondary | ICD-10-CM | POA: Diagnosis not present

## 2021-02-11 DIAGNOSIS — E1159 Type 2 diabetes mellitus with other circulatory complications: Secondary | ICD-10-CM | POA: Diagnosis not present

## 2021-02-11 DIAGNOSIS — M5116 Intervertebral disc disorders with radiculopathy, lumbar region: Secondary | ICD-10-CM | POA: Diagnosis not present

## 2021-02-11 DIAGNOSIS — G8929 Other chronic pain: Secondary | ICD-10-CM | POA: Diagnosis not present

## 2021-02-11 DIAGNOSIS — E7800 Pure hypercholesterolemia, unspecified: Secondary | ICD-10-CM | POA: Diagnosis not present

## 2021-02-11 DIAGNOSIS — Z87891 Personal history of nicotine dependence: Secondary | ICD-10-CM | POA: Diagnosis not present

## 2021-02-25 ENCOUNTER — Ambulatory Visit (INDEPENDENT_AMBULATORY_CARE_PROVIDER_SITE_OTHER): Payer: Medicare Other | Admitting: Pharmacist

## 2021-02-25 DIAGNOSIS — M5116 Intervertebral disc disorders with radiculopathy, lumbar region: Secondary | ICD-10-CM | POA: Diagnosis not present

## 2021-02-25 DIAGNOSIS — I152 Hypertension secondary to endocrine disorders: Secondary | ICD-10-CM | POA: Diagnosis not present

## 2021-02-25 DIAGNOSIS — Z8673 Personal history of transient ischemic attack (TIA), and cerebral infarction without residual deficits: Secondary | ICD-10-CM | POA: Diagnosis not present

## 2021-02-25 DIAGNOSIS — G8929 Other chronic pain: Secondary | ICD-10-CM | POA: Diagnosis not present

## 2021-02-25 DIAGNOSIS — E1122 Type 2 diabetes mellitus with diabetic chronic kidney disease: Secondary | ICD-10-CM | POA: Diagnosis not present

## 2021-02-25 DIAGNOSIS — Z79899 Other long term (current) drug therapy: Secondary | ICD-10-CM | POA: Diagnosis not present

## 2021-02-25 DIAGNOSIS — I10 Essential (primary) hypertension: Secondary | ICD-10-CM

## 2021-02-25 DIAGNOSIS — E7800 Pure hypercholesterolemia, unspecified: Secondary | ICD-10-CM | POA: Diagnosis not present

## 2021-02-25 DIAGNOSIS — N1831 Chronic kidney disease, stage 3a: Secondary | ICD-10-CM

## 2021-02-25 DIAGNOSIS — M545 Low back pain, unspecified: Secondary | ICD-10-CM | POA: Diagnosis not present

## 2021-02-25 DIAGNOSIS — Z7901 Long term (current) use of anticoagulants: Secondary | ICD-10-CM | POA: Diagnosis not present

## 2021-02-25 DIAGNOSIS — Z981 Arthrodesis status: Secondary | ICD-10-CM | POA: Diagnosis not present

## 2021-02-25 DIAGNOSIS — Z87891 Personal history of nicotine dependence: Secondary | ICD-10-CM | POA: Diagnosis not present

## 2021-02-25 DIAGNOSIS — E1159 Type 2 diabetes mellitus with other circulatory complications: Secondary | ICD-10-CM | POA: Diagnosis not present

## 2021-02-25 NOTE — Progress Notes (Signed)
Chronic Care Management Pharmacy Note  02/25/2021 Name:  Jill Hudson MRN:  [id number] DOB:  Mar 20, 1935  Subjective: Jill Hudson is an 86 y.o. year old female who is a primary patient of Army Melia, Jesse Sans, MD.  The CCM team was consulted for assistance with disease management and care coordination needs.    Engaged with patient by telephone for follow up visit in response to provider referral for pharmacy case management and/or care coordination services.   Consent to Services:  The patient was given information about Chronic Care Management services, agreed to services, and gave verbal consent prior to initiation of services.  Please see initial visit note for detailed documentation.   Patient Care Team: [name], MD as PCP - General (Internal Medicine) [name], Dolores Lory, MD as Consulting Physician (Vascular Surgery) [name], MD as Consulting Physician (Endocrinology) [name], DPM as Consulting Physician (Podiatry) Corey Skains, MD as Consulting Physician (Cardiology) Jannet Mantis, MD as Consulting Physician (Dermatology) Deetta Perla, MD as Consulting Physician (Neurosurgery) [name], RN as Case Manager (General Practice) Vladimir Faster, [facility] (Pharmacist) Murlean Iba, MD (Nephrology)  Recent office visits: 12/25/20-Berglund(PCP)- blood work, mammagram ordered 10/08/21-Dr. Amanda Pea- trochanter bursitis, COPD with chronic bronchitis- prednisone, prometh DM  Recent consult visits: 02/10/21-O'Connell- (endocrinology)- novolog 4 units B/L 5 units at supper, increase by 1 unit for any mealtime reading >225, Freestyle download 14 day avg 196, high evening readings, A1c 8.8 ? Increase lantus 01/13/21- Singh(nephrology)- 30-40 oz water daily, no med changes 01/06/21- Kuritzky (cards)- no med changes 2/15.22- Chasnis- ESI injection- dexamethasone and 2% lidocaine 12/05/20- Chasnis- eval for ESi 11/28/20- Vascular- US ABI bilat  Hospital  visits: 07/20/20- ED- epistaxis  Objective:  Lab Results  Component Value Date   CREATININE 1.65 (H) 12/25/2020   BUN 35 (H) 12/25/2020   GFRNONAA 34 (L) 07/20/2020   GFRAA 39 (L) 07/20/2020   NA 132 (L) 12/25/2020   K 4.9 12/25/2020   CALCIUM 9.5 12/25/2020   CO2 20 12/25/2020    Lab Results  Component Value Date/Time   HGBA1C 7.7 09/30/2020 12:00 AM   HGBA1C 8.2 06/14/2020 12:00 AM    Last diabetic Eye exam:  Lab Results  Component Value Date/Time   HMDIABEYEEXA Retinopathy (A) 09/06/2019 12:00 AM    Last diabetic Foot exam: No results found for: HMDIABFOOTEX   Lab Results  Component Value Date   CHOL 117 02/19/2020   HDL 56 02/19/2020   LDLCALC 54 02/19/2020   TRIG 34 02/19/2020   CHOLHDL 2.1 02/19/2020    Hepatic Function Latest Ref Rng & Units 12/25/2020 02/21/2020 12/26/2019  Total Protein 6.0 - 8.5 g/dL 6.3 6.1(L) 5.2(L)  Albumin 3.6 - 4.6 g/dL 3.9 3.5 3.0(L)  AST 0 - 40 IU/L 23 33 11(L)  ALT 0 - 32 IU/L 25 50(H) 11  Alk Phosphatase 44 - 121 IU/L 107 78 55  Total Bilirubin 0.0 - 1.2 mg/dL <0.2 0.7 0.8  Bilirubin, Direct 0.0 - 0.2 mg/dL - <0.1 -    Lab Results  Component Value Date/Time   TSH 3.720 12/25/2020 02:27 PM   TSH 3.512 12/25/2019 07:31 AM   TSH 3.750 12/26/2018 04:12 PM   FREET4 1.22 01/16/2016 11:35 AM    CBC Latest Ref Rng & Units 12/25/2020 07/20/2020 02/26/2020  WBC 3.4 - 10.8 x10E3/uL 8.3 8.1 16.3(H)  Hemoglobin 11.1 - 15.9 g/dL 10.7(L) 12.5 10.7(L)  Hematocrit 34.0 - 46.6 % 33.3(L) 36.9 32.0(L)  Platelets 150 - 450  x10E3/uL 251 228 305    No results found for: VD25OH  Clinical ASCVD: Yes  The ASCVD Risk score Mikey Bussing DC Jr., et al., 2013) failed to calculate for the following reasons:   The 2013 ASCVD risk score is only valid for ages 72 to 36   The patient has a prior MI or stroke diagnosis    Depression screen [facility] 2/9 12/25/2020 10/08/2020 09/04/2020  Decreased Interest 0 3 0  Down, Depressed, Hopeless 0 0 0  PHQ - 2 Score 0 3 0   Altered sleeping 2 0 -  Tired, decreased energy 2 3 -  Change in appetite 0 0 -  Feeling bad or failure about yourself  0 0 -  Trouble concentrating 0 3 -  Moving slowly or fidgety/restless 0 0 -  Suicidal thoughts 0 0 -  PHQ-9 Score 4 9 -  Difficult doing work/chores - - -  Some recent data might be hidden      Social History   Tobacco Use  Smoking Status Former Smoker  . Packs/day: 2.00  . Years: 37.00  . Pack years: 74.00  . Types: Cigarettes  . Quit date: 67  . Years since quitting: 42.3  Smokeless Tobacco Never Used  Tobacco Comment   smoking cessation materials not required   BP Readings from Last 3 Encounters:  12/25/20 (!) 142/78  10/08/20 (!) 132/40  08/07/20 106/68   Pulse Readings from Last 3 Encounters:  12/25/20 67  10/08/20 61  08/07/20 70   Wt Readings from Last 3 Encounters:  12/25/20 121 lb (54.9 kg)  10/08/20 120 lb (54.4 kg)  08/07/20 119 lb (54 kg)    Assessment/Interventions: Review of patient past medical history, allergies, medications, health status, including review of consultants reports, laboratory and other test data, was performed as part of comprehensive evaluation and provision of chronic care management services.   SDOH:  (Social Determinants of Health) assessments and interventions performed: No     Immunization History  Administered Date(s) Administered  . Fluad Quad(high Dose 65+) 09/14/2019, 08/07/2020  . Influenza, High Dose Seasonal PF 08/17/2017  . Influenza, Seasonal, Injecte, Preservative Fre 09/04/2005, 08/10/2007, 08/27/2010  . Influenza,inj,Quad PF,6+ Mos 08/08/2014, 09/16/2015, 09/23/2016, 07/22/2018  . Influenza,inj,quad, With Preservative 08/16/2013, 09/14/2019  . PFIZER Comirnaty(Gray Top)Covid-19 Tri-Sucrose Vaccine 12/01/2019, 12/22/2019, 10/22/2020  . Pneumococcal Conjugate-13 09/10/2014  . Pneumococcal Polysaccharide-23 10/29/2003  . Pneumococcal-Unspecified 09/04/2010  . Tdap 10/26/2008  . Varicella  05/07/2011  . Zoster 05/07/2011    Conditions to be addressed/monitored:  Hypertension, Atrial Fibrillation, Heart Failure, Coronary Artery Disease, GERD, COPD, Chronic Kidney Disease, Osteoarthritis and DDD  Care Plan : [place]  Updates made by Vladimir Faster, RPH since 02/25/2021 12:00 AM    Problem: HTN, DM, Afib, chronic pain   Priority: High    Long-Range Goal: Disease Management   Start Date: 12/12/2020  This Visit's Progress: On track  Recent Progress: On track  Priority: High  Note:   Current Barriers:  . Unable to independently monitor therapeutic efficacy . Unable to maintain control of diabetes   Pharmacist Clinical Goal(s):  Marland Kitchen Over the next 90 days, patient will achieve control of diabetes as evidenced by HbA1c and SMBG through collaboration with PharmD and provider.  . Remove barriers to medication  Interventions: . 1:1 collaboration with [name], MD regarding development and update of comprehensive plan of care as evidenced by provider attestation and co-signature . Inter-disciplinary care team collaboration (see longitudinal plan of care) .  Comprehensive medication review performed; medication list updated in electronic medical record  Hypertension/ CKD (BP goal <130/80) -uncontrolled -Current treatment: . Losartan 25 mg bid . Amlodipine 5 mg bid . Torsemide 10 mg bid . Clonidine 0.1 mg up to bid for SBP>180 -Medications previously tried: NA -Current home readings: 110-140s over 40s to 50s -Denies hypotensive/hypertensive symptoms -Educated on Daily salt intake goal < 2300 mg; Importance of home blood pressure monitoring; Symptoms of hypotension and importance of maintaining adequate hydration; -Counseled to monitor BP at home daily, document, and provide log at future appointments -Counseled on diet and exercise extensively Recommended to continue current medication    Hyperlipidemia, coronary arteriosclerosis/ carotid stenosis  bilat: (LDL goal < 70) -controlled -Current treatment: . Rosuvastatin 5 mg 4 days weekly -Medications previously tried: NA -Educated on Cholesterol goals;  Benefits of statin for ASCVD risk reduction; Exercise goal of 150 minutes per week; -Recommended to continue current medication Lab Results  Component Value Date   HGBA1C 7.7 09/30/2020  8.8 at endo in March  Diabetes (A1c goal <7.5%) -uncontrolled -Current medications: . Novolog 4 units B/L 5 units at supper with meals, increase by 1 unit u if AC BG >250 ( Has only been taking 4 units with each meal) . Basaglar 5 u qam  . Gabapentin 100 mg qhs -Medications previously tried: saxagliptin- diarrhea  -Current home glucose readings Using [employer] . fasting glucose: < 200, one reading of 67 . post prandial glucose: 300s -Reports hypoglycemic/hyperglycemic symptoms. Felt "woozy" when her BG was 67 -Current meal patterns: follows heart healthy, low-sodium diet . Dinner:protein and veggies . snacks: apple before bed . drinks: water -Current exercise: dances to music channels daily, walks around at home -[employer] and blood sugar goals; Prevention and management of hypoglycemic episodes; Continuous glucose monitoring; -Counseled to check feet daily and get yearly eye exams -Counseled on diet and exercise extensively Educated on effects of steroid injection on BG and increasing insulin as instructed by endo has been elevated Recommend she follow SS for mealtime coverage from endo 1 extra for BG 200-250, 2 if BG 251-300,    Atrial Fibrillation (Goal: prevent stroke and major bleeding) -controlled  -CHADSVASC: 7 -Current treatment:  . Anticoagulation: Eliquis 2.5 mg bid -Medications previously tried: NA -Counseled on increased risk of stroke due to Afib and benefits of anticoagulation for stroke prevention; importance of adherence to anticoagulant exactly as prescribed; seeking medical attention after a head injury  or if there is blood in the urine/stool; -Recommended to continue current medication  Heart Failure (Goal: control symptoms and prevent exacerbations) controlled Type: Diastolic -NYHA Class: II (slight limitation of activity) -Ejection fraction: 55-60%(Date: 02/19/20) -Current treatment:  Torsemide 10 mg bid prn -Medications previously tried: na  DDD  Lumbosacral pain(Goal: improve QOL  & decrease symptoms) -Not ideally controlled -Current treatment  . Acetaminophen prn . Gabapentin 100 mg qhs . ESI injection 12/10/20 -Medications previously tried: baclofen  -Counseled on effects of steroids on BG Recommend she follow SS for mealtime coverage from endo 1 extra for BG 200-250, 2 if BG 251-300,  -Educated on Importance of weighing daily; if you gain more than 3 pounds in one day or 5 pounds in one week, Call Cardiology or PCP -   Patient Goals/Self-Care Activities . Over the next 90 days, patient will:  - take medications as prescribed check glucose multiple times daily, document, and provide at future appointments check blood pressure daily, document, and provide at future appointments weigh daily, and contact  provider if weight gain of 3 lbs overnight  Follow Up Plan: Telephone follow up appointment with care management team member scheduled for: 3 months PharmD , CPA 6-8 weeks      Medication Assistance: None required.  Patient affirms current coverage meets needs.  Patient's preferred pharmacy is:  [id number], [place] [address] AT [address] [place] [address] Phone: [phone] Fax: [phone]  [place], [place] [address] [place] [address] Phone: [phone] Fax: [phone]  Uses pill box? Yes Pt endorses 90% compliance  We discussed: Benefits of medication synchronization, packaging and delivery as well as enhanced pharmacist oversight with [employer]. Patient  decided to: Continue current medication management strategy  Care Plan and Follow Up Patient Decision:  Patient agrees to Care Plan and Follow-up.  Plan: Telephone follow up appointment with care management team member scheduled for:  3 months   Junita Push. Kenton Kingfisher PharmD, [facility] [phone]

## 2021-02-25 NOTE — Patient Instructions (Signed)
Visit Information  It was a pleasure speaking with you today. Thank you for letting me be part of your clinical team. Please call with any questions or concerns.   Goals Addressed            This Visit's Progress   . Track and Manage Heart Rate and Rhythm-Atrial Fibrillation       Timeframe:  Long-Range Goal Priority:  Medium Start Date:                             Expected End Date:                       Follow Up Date 3 month follow up  - check pulse (heart) rate once a day - make a plan to exercise regularly - make a plan to eat healthy - take medicine as prescribed    Why is this important?    Atrial fibrillation may have no symptoms. Sometimes the symptoms get worse or happen more often.   It is important to keep track of what your symptoms are and when they happen.   A change in symptoms is important to discuss with your doctor or nurse.   Being active and healthy eating will also help you manage your heart condition.     Notes:     . Track and Manage My Blood Pressure-Hypertension       Timeframe:  Long-Range Goal Priority:  Medium Start Date:                             Expected End Date:                       Follow Up Date 3 month follow up   - check blood pressure daily - write blood pressure results in a log or diary    Why is this important?    You won't feel high blood pressure, but it can still hurt your blood vessels.   High blood pressure can cause heart or kidney problems. It can also cause a stroke.   Making lifestyle changes like losing a little weight or eating less salt will help.   Checking your blood pressure at home and at different times of the day can help to control blood pressure.   If the doctor prescribes medicine remember to take it the way the doctor ordered.   Call the office if you cannot afford the medicine or if there are questions about it.     Notes:     . Track and Manage Symptoms-Heart Failure       Timeframe:   Long-Range Goal Priority:  Medium Start Date:                             Expected End Date:                       Follow Up Date 3 month follow up    - eat more whole grains, fruits and vegetables, lean meats and healthy fats - know when to call the doctor    Why is this important?    You will be able to handle your symptoms better if you keep track of them.   Making some simple changes to your  lifestyle will help.   Eating healthy is one thing you can do to take good care of yourself.    Notes:        The patient verbalized understanding of instructions, educational materials, and care plan provided today and agreed to receive a mailed copy of patient instructions, educational materials, and care plan.   Telephone follow up appointment with pharmacy team member scheduled for: CPA 4-6 weeks, PharmD 3 months  Junita Push. Kenton Kingfisher PharmD, [place] Clinical Pharmacist [phone]

## 2021-03-05 ENCOUNTER — Ambulatory Visit: Payer: Self-pay | Admitting: *Deleted

## 2021-03-05 NOTE — Telephone Encounter (Signed)
Patient with 4-5 mostly watery diarrhea yesterday and one loose stool today. Took one imodium yesterday and one tab this am.Stomach rumbles but no abdominal pain,dizziness,N/V. No blood noted in stool. No fever.  No s/sx dehydration. Care Advice including take imodium as directed by directions, today. Sip of fluids every hour. Later today try small amount of crackers or toast. Tomorrow, try small bland meals if diarrhea slows. See any blood in stool, dizziness/severe abdominal pain that last seek treatment at the ED. Reason for Disposition . MILD-MODERATE diarrhea (e.g., 1-6 times / day more than normal)  Answer Assessment - Initial Assessment Questions 1. DIARRHEA SEVERITY: "How bad is the diarrhea?" "How many more stools have you had in the past 24 hours than normal?"    - NO DIARRHEA (SCALE 0)   - MILD (SCALE 1-3): Few loose or mushy BMs; increase of 1-3 stools over normal daily number of stools; mild increase in ostomy output.   -  MODERATE (SCALE 4-7): Increase of 4-6 stools daily over normal; moderate increase in ostomy output. * SEVERE (SCALE 8-10; OR 'WORST POSSIBLE'): Increase of 7 or more stools daily over normal; moderate increase in ostomy output; incontinence.     4-6x between 2 days. 2. ONSET: "When did the diarrhea begin?"      yesterday 3. BM CONSISTENCY: "How loose or watery is the diarrhea?"      Mostly water 4. VOMITING: "Are you also vomiting?" If Yes, ask: "How many times in the past 24 hours?"      no 5. ABDOMINAL PAIN: "Are you having any abdominal pain?" If Yes, ask: "What does it feel like?" (e.g., crampy, dull, intermittent, constant)      Only gas 6. ABDOMINAL PAIN SEVERITY: If present, ask: "How bad is the pain?"  (e.g., Scale 1-10; mild, moderate, or severe)   - MILD (1-3): doesn't interfere with normal activities, abdomen soft and not tender to touch    - MODERATE (4-7): interferes with normal activities or awakens from sleep, abdomen tender to touch    - SEVERE  (8-10): excruciating pain, doubled over, unable to do any normal activities       mild 7. ORAL INTAKE: If vomiting, "Have you been able to drink liquids?" "How much liquids have you had in the past 24 hours?"     2 cups today 8. HYDRATION: "Any signs of dehydration?" (e.g., dry mouth [not just dry lips], too weak to stand, dizziness, new weight loss) "When did you last urinate?"     none 9. EXPOSURE: "Have you traveled to a foreign country recently?" "Have you been exposed to anyone with diarrhea?" "Could you have eaten any food that was spoiled?"     no 10. ANTIBIOTIC USE: "Are you taking antibiotics now or have you taken antibiotics in the past 2 months?"       no 11. OTHER SYMPTOMS: "Do you have any other symptoms?" (e.g., fever, blood in stool)       none 12. PREGNANCY: "Is there any chance you are pregnant?" "When was your last menstrual period?"       na  Protocols used: [facility]

## 2021-03-09 ENCOUNTER — Other Ambulatory Visit: Payer: Self-pay | Admitting: Internal Medicine

## 2021-03-09 DIAGNOSIS — M5417 Radiculopathy, lumbosacral region: Secondary | ICD-10-CM

## 2021-03-10 DIAGNOSIS — M545 Low back pain, unspecified: Secondary | ICD-10-CM | POA: Diagnosis not present

## 2021-03-10 DIAGNOSIS — I152 Hypertension secondary to endocrine disorders: Secondary | ICD-10-CM | POA: Diagnosis not present

## 2021-03-10 DIAGNOSIS — E7800 Pure hypercholesterolemia, unspecified: Secondary | ICD-10-CM | POA: Diagnosis not present

## 2021-03-10 DIAGNOSIS — Z8673 Personal history of transient ischemic attack (TIA), and cerebral infarction without residual deficits: Secondary | ICD-10-CM | POA: Diagnosis not present

## 2021-03-10 DIAGNOSIS — Z981 Arthrodesis status: Secondary | ICD-10-CM | POA: Diagnosis not present

## 2021-03-10 DIAGNOSIS — E1159 Type 2 diabetes mellitus with other circulatory complications: Secondary | ICD-10-CM | POA: Diagnosis not present

## 2021-03-10 DIAGNOSIS — Z79899 Other long term (current) drug therapy: Secondary | ICD-10-CM | POA: Diagnosis not present

## 2021-03-10 DIAGNOSIS — G8929 Other chronic pain: Secondary | ICD-10-CM | POA: Diagnosis not present

## 2021-03-10 DIAGNOSIS — Z794 Long term (current) use of insulin: Secondary | ICD-10-CM | POA: Diagnosis not present

## 2021-03-10 DIAGNOSIS — Z7901 Long term (current) use of anticoagulants: Secondary | ICD-10-CM | POA: Diagnosis not present

## 2021-03-10 DIAGNOSIS — Z87891 Personal history of nicotine dependence: Secondary | ICD-10-CM | POA: Diagnosis not present

## 2021-03-10 DIAGNOSIS — M5116 Intervertebral disc disorders with radiculopathy, lumbar region: Secondary | ICD-10-CM | POA: Diagnosis not present

## 2021-03-10 DIAGNOSIS — E118 Type 2 diabetes mellitus with unspecified complications: Secondary | ICD-10-CM | POA: Diagnosis not present

## 2021-03-14 DIAGNOSIS — H353211 Exudative age-related macular degeneration, right eye, with active choroidal neovascularization: Secondary | ICD-10-CM | POA: Diagnosis not present

## 2021-03-17 DIAGNOSIS — M5126 Other intervertebral disc displacement, lumbar region: Secondary | ICD-10-CM | POA: Diagnosis not present

## 2021-03-17 DIAGNOSIS — M5416 Radiculopathy, lumbar region: Secondary | ICD-10-CM | POA: Diagnosis not present

## 2021-03-17 DIAGNOSIS — M5136 Other intervertebral disc degeneration, lumbar region: Secondary | ICD-10-CM | POA: Diagnosis not present

## 2021-03-17 DIAGNOSIS — M62830 Muscle spasm of back: Secondary | ICD-10-CM | POA: Diagnosis not present

## 2021-03-20 ENCOUNTER — Other Ambulatory Visit: Payer: Self-pay

## 2021-03-20 ENCOUNTER — Other Ambulatory Visit: Payer: Self-pay | Admitting: Internal Medicine

## 2021-03-20 ENCOUNTER — Encounter: Payer: Self-pay | Admitting: Internal Medicine

## 2021-03-20 ENCOUNTER — Telehealth: Payer: Self-pay

## 2021-03-20 ENCOUNTER — Ambulatory Visit (INDEPENDENT_AMBULATORY_CARE_PROVIDER_SITE_OTHER): Payer: Medicare Other | Admitting: Internal Medicine

## 2021-03-20 VITALS — BP 136/42 | HR 65 | Temp 97.5°F | Ht 60.0 in | Wt 120.0 lb

## 2021-03-20 DIAGNOSIS — I70212 Atherosclerosis of native arteries of extremities with intermittent claudication, left leg: Secondary | ICD-10-CM

## 2021-03-20 DIAGNOSIS — I5032 Chronic diastolic (congestive) heart failure: Secondary | ICD-10-CM | POA: Diagnosis not present

## 2021-03-20 DIAGNOSIS — J449 Chronic obstructive pulmonary disease, unspecified: Secondary | ICD-10-CM

## 2021-03-20 DIAGNOSIS — E1169 Type 2 diabetes mellitus with other specified complication: Secondary | ICD-10-CM

## 2021-03-20 DIAGNOSIS — D6869 Other thrombophilia: Secondary | ICD-10-CM | POA: Diagnosis not present

## 2021-03-20 DIAGNOSIS — I10 Essential (primary) hypertension: Secondary | ICD-10-CM | POA: Diagnosis not present

## 2021-03-20 DIAGNOSIS — N1831 Chronic kidney disease, stage 3a: Secondary | ICD-10-CM | POA: Diagnosis not present

## 2021-03-20 DIAGNOSIS — H353210 Exudative age-related macular degeneration, right eye, stage unspecified: Secondary | ICD-10-CM | POA: Diagnosis not present

## 2021-03-20 DIAGNOSIS — E785 Hyperlipidemia, unspecified: Secondary | ICD-10-CM

## 2021-03-20 DIAGNOSIS — N183 Chronic kidney disease, stage 3 unspecified: Secondary | ICD-10-CM | POA: Diagnosis not present

## 2021-03-20 DIAGNOSIS — I480 Paroxysmal atrial fibrillation: Secondary | ICD-10-CM

## 2021-03-20 DIAGNOSIS — E113553 Type 2 diabetes mellitus with stable proliferative diabetic retinopathy, bilateral: Secondary | ICD-10-CM

## 2021-03-20 DIAGNOSIS — E1122 Type 2 diabetes mellitus with diabetic chronic kidney disease: Secondary | ICD-10-CM

## 2021-03-20 DIAGNOSIS — F39 Unspecified mood [affective] disorder: Secondary | ICD-10-CM

## 2021-03-20 DIAGNOSIS — E441 Mild protein-calorie malnutrition: Secondary | ICD-10-CM | POA: Diagnosis not present

## 2021-03-20 LAB — POCT URINALYSIS DIPSTICK
Bilirubin, UA: NEGATIVE
Glucose, UA: NEGATIVE
Ketones, UA: NEGATIVE
Nitrite, UA: NEGATIVE
Protein, UA: NEGATIVE
Spec Grav, UA: <=1.005 — AB (ref 1.010–1.025)
Urobilinogen, UA: 0.2 E.U./dL
pH, UA: 7 (ref 5.0–8.0)

## 2021-03-20 NOTE — Progress Notes (Signed)
Date:  03/20/2021   Name:  Jill Hudson   DOB:  04-15-1935   MRN:  [id number]   Chief Complaint: Annual Exam (Breast exam no pap, foot exam )  Jill Hudson is a 86 y.o. female who presents today for her yearly Exam. She feels well. She reports exercising walking every day. She reports she is sleeping well. Breast complaints none. Husband is bedbound on hospice.  Mammogram: ordered for [facility] DEXA: 06/2018 osteopenia Colonoscopy: 07/2018 diverticulosis, no polyps  Immunization History  Administered Date(s) Administered  . Fluad Quad(high Dose 65+) 09/14/2019, 08/07/2020  . Influenza, High Dose Seasonal PF 08/17/2017  . Influenza, Seasonal, Injecte, Preservative Fre 09/04/2005, 08/10/2007, 08/27/2010  . Influenza,inj,Quad PF,6+ Mos 08/08/2014, 09/16/2015, 09/23/2016, 07/22/2018  . Influenza,inj,quad, With Preservative 08/16/2013, 09/14/2019  . PFIZER Comirnaty([place])Covid-19 Tri-Sucrose Vaccine 12/01/2019, 12/22/2019, 10/22/2020  . Pneumococcal Conjugate-13 09/10/2014  . Pneumococcal Polysaccharide-23 10/29/2003  . Pneumococcal-Unspecified 09/04/2010  . Tdap 10/26/2008  . Varicella 05/07/2011  . Zoster, Live 05/07/2011    Hypertension This is a chronic problem. The problem is resistant. Pertinent negatives include no chest pain, headaches, palpitations or shortness of breath. Past treatments include angiotensin blockers, calcium channel blockers and diuretics (and PRN clonidine). The current treatment provides significant improvement. Hypertensive end-organ damage includes kidney disease, CAD/MI and CVA.  Diabetes She presents for her follow-up diabetic visit. She has type 2 (treated by Endo) diabetes mellitus. Pertinent negatives for hypoglycemia include no dizziness, headaches, nervousness/anxiousness or tremors. Pertinent negatives for diabetes include no chest pain, no fatigue, no polydipsia and no polyuria. Diabetic complications include a CVA. Current diabetic  treatment includes insulin injections. She is compliant with treatment all of the time. An ACE inhibitor/angiotensin II receptor blocker is being taken. Eye exam is current.  Hyperlipidemia This is a chronic problem. The problem is controlled. Pertinent negatives include no chest pain or shortness of breath. Current antihyperlipidemic treatment includes statins (tolerating crestor MWFS). The current treatment provides significant improvement of lipids. There are no compliance problems.   paroxysmal Atrial fibrillation - on Eliquis without bleeding issues. She is not aware of any episodes of Afib. She does have easy brusing. PAD - numerous interventions in 2019 to LLE.  Had follow up with Dr. [name] in Feb 2022.  Lab Results  Component Value Date   CREATININE 1.65 (H) 12/25/2020   BUN 35 (H) 12/25/2020   NA 132 (L) 12/25/2020   K 4.9 12/25/2020   CL 96 12/25/2020   CO2 20 12/25/2020   Lab Results  Component Value Date   CHOL 164 02/10/2021   HDL 48 02/10/2021   LDLCALC 88 02/10/2021   TRIG 137 02/10/2021   CHOLHDL 2.1 02/19/2020   Lab Results  Component Value Date   TSH 3.720 12/25/2020   Lab Results  Component Value Date   HGBA1C 8.8 02/10/2021   Lab Results  Component Value Date   WBC 8.3 12/25/2020   HGB 10.7 (L) 12/25/2020   HCT 33.3 (L) 12/25/2020   MCV 92 12/25/2020   PLT 251 12/25/2020   Lab Results  Component Value Date   ALT 25 12/25/2020   AST 23 12/25/2020   ALKPHOS 107 12/25/2020   BILITOT <0.2 12/25/2020     Review of Systems  Constitutional: Negative for chills, fatigue and fever.  HENT: Negative for congestion, hearing loss, tinnitus, trouble swallowing and voice change.   Eyes: Negative for visual disturbance.  Respiratory: Negative for cough, chest tightness, shortness of breath and wheezing.  Cardiovascular: Negative for chest pain, palpitations and leg swelling.  Gastrointestinal: Negative for abdominal pain, constipation, diarrhea and  vomiting.  Endocrine: Negative for polydipsia and polyuria.  Genitourinary: Negative for dysuria, frequency, genital sores, vaginal bleeding and vaginal discharge.  Musculoskeletal: Negative for arthralgias, gait problem and joint swelling.  Skin: Negative for color change and rash.  Neurological: Negative for dizziness, tremors, light-headedness and headaches.  Hematological: Negative for adenopathy. Does not bruise/bleed easily.  Psychiatric/Behavioral: Negative for dysphoric mood and sleep disturbance. The patient is not nervous/anxious.     Patient Active Problem List   Diagnosis Date Noted  . Acquired thrombophilia ([place]) 03/20/2021  . Mood disorder ([place]) 05/02/2020  . Chronic heart failure with preserved ejection fraction ([place]) 04/12/2020  . Diverticulosis of large intestine without perforation or abscess with bleeding 03/27/2020  . AF (paroxysmal atrial fibrillation) ([place])   . Thrush   . COPD (chronic obstructive pulmonary disease) with chronic bronchitis ([place])   . CKD (chronic kidney disease), stage IIIa 02/18/2020  . Malnutrition of mild degree ([place]) 01/08/2020  . Age-related macular degeneration, dry, left eye 06/21/2019  . Age-related macular degeneration, wet, right eye ([place]) 06/21/2019  . Moderate nonproliferative diabetic retinopathy associated with type 2 diabetes mellitus ([place]) 06/21/2019  . Lumbosacral radiculopathy at L4 05/01/2019  . Underweight 05/01/2019  . Encounter for long-term (current) use of aspirin 10/31/2018  . Encounter for long-term (current) use of antiplatelets/antithrombotics 10/31/2018  . Long term current use of oral hypoglycemic drug 10/31/2018  . Encounter for long-term (current) use of insulin ([place]) 10/31/2018  . GIB (gastrointestinal bleeding) 08/11/2018  . Leg pain 07/03/2017  . Carpal tunnel syndrome on both sides 05/05/2017  . Myalgia due to HMG CoA reductase inhibitor 05/05/2017  . History of CVA (cerebrovascular accident) 02/15/2017  .  Degenerative disc disease, lumbar 12/21/2016  . Atherosclerosis of native arteries of extremity with intermittent claudication ([place]) 12/20/2016  . Bilateral carotid artery stenosis 12/20/2016  . Occlusion and stenosis of bilateral carotid arteries 12/20/2016  . Hip bursitis 05/18/2016  . Elevated TSH 01/18/2016  . CKD stage 3 due to type 2 diabetes mellitus ([place]) 01/16/2016  . Senile ecchymosis 01/16/2016  . Type II diabetes mellitus with renal manifestations ([place]) 09/16/2015  . GERD (gastroesophageal reflux disease) 07/24/2015  . PVD (peripheral vascular disease) ([place]) 05/17/2015  . Neoplasm of uncertain behavior of skin 05/17/2015  . Retinopathy, diabetic, proliferative ([place]) 04/11/2015  . Hyperlipidemia associated with type 2 diabetes mellitus ([place]) 04/11/2015  . Essential hypertension 04/11/2015  . Generalized OA 04/11/2015  . Proliferative diabetic retinopathy(362.02) 04/11/2015  . Arteriosclerosis of coronary artery 05/29/2013  . Hypertensive heart disease without CHF 05/29/2013    Allergies  Allergen Reactions  . Saxagliptin Diarrhea  . Epinephrine Other (See Comments)    Patient does not remember what happens when she uses this  . Atorvastatin Other (See Comments)    Muscle aches  . Codeine Other (See Comments)    Upset stomach  . Ezetimibe Other (See Comments)    Myalgias(ZETIA)  . Limonene Rash    Patient does not recall this reaction  . Nitrofurantoin Rash and Other (See Comments)    Pruitus  . Sulfa Antibiotics Rash and Other (See Comments)    Sore mouth     Past Surgical History:  Procedure Laterality Date  . CARDIAC CATHETERIZATION  1998   40% LM, 95% Ramus interm  . CATARACT EXTRACTION, BILATERAL    . COLONOSCOPY WITH PROPOFOL N/A 08/12/2018   Procedure: COLONOSCOPY WITH PROPOFOL;  Surgeon: Lucilla Lame, MD;  Location: [facility] ENDOSCOPY;  Service: Endoscopy;  Laterality: N/A;  . ENDARTERECTOMY FEMORAL Left 10/12/2018   Procedure: ENDARTERECTOMY FEMORAL;   Surgeon: Katha Cabal, MD;  Location: [facility] ORS;  Service: Vascular;  Laterality: Left;  angioplasty and left SFA stent placement  . EYE SURGERY Bilateral    cataract extractions  . LOWER EXTREMITY ANGIOGRAPHY Left 08/23/2017   Procedure: Lower Extremity Angiography;  Surgeon: Algernon Huxley, MD;  Location: [facility];  Service: Cardiovascular;  Laterality: Left;  . LOWER EXTREMITY ANGIOGRAPHY Left 07/26/2018   Procedure: LOWER EXTREMITY ANGIOGRAPHY;  Surgeon: Katha Cabal, MD;  Location: [facility];  Service: Cardiovascular;  Laterality: Left;  . LOWER EXTREMITY ANGIOGRAPHY Left 09/16/2018   Procedure: LOWER EXTREMITY ANGIOGRAPHY;  Surgeon: Katha Cabal, MD;  Location: [facility];  Service: Cardiovascular;  Laterality: Left;  . PTCA  08/2013   Left common iliac  . PTCA  12/2012   left ext iliac  . RIGHT HEART CATH N/A 02/23/2020   Procedure: RIGHT HEART CATH;  Surgeon: Minna Merritts, MD;  Location: [facility];  Service: Cardiovascular;  Laterality: N/A;  . TUBAL LIGATION      Social History   Tobacco Use  . Smoking status: Former Smoker    Packs/day: 2.00    Years: 37.00    Pack years: 74.00    Types: Cigarettes    Quit date: 1980    Years since quitting: 42.4  . Smokeless tobacco: Never Used  . Tobacco comment: smoking cessation materials not required  Vaping Use  . Vaping Use: Never used  Substance Use Topics  . Alcohol use: No    Alcohol/week: 0.0 standard drinks  . Drug use: No     Medication list has been reviewed and updated.  Current Meds  Medication Sig  . acetaminophen (TYLENOL) 650 MG CR tablet Take 1,300 mg by mouth every 8 (eight) hours as needed for pain.  Marland Kitchen albuterol (VENTOLIN HFA) 108 (90 Base) MCG/ACT inhaler Inhale 2 puffs into the lungs every 6 (six) hours as needed for wheezing or shortness of breath.  Marland Kitchen amLODipine (NORVASC) 5 MG tablet Take 5 mg by mouth in the morning and at bedtime.  . B-D  ULTRAFINE III SHORT PEN 31G X 8 MM MISC USE AS DIRECTED  . cloNIDine (CATAPRES) 0.2 MG tablet Take 1 tablet (0.2 mg total) by mouth 2 (two) times daily. Please check blood pressure prior to taking medication.  Only take if blood pressure is greater than 240 systolic. (Patient taking differently: Take 0.2 mg by mouth as needed. Please check blood pressure prior to taking medication.  Only take if blood pressure is greater than 973 systolic.)  . Cyanocobalamin 1000 MCG TBCR Take 1,000 mcg by mouth daily.  Marland Kitchen ELIQUIS 2.5 MG TABS tablet TAKE 1 TABLET(2.5 MG) BY MOUTH TWICE DAILY  . feeding supplement, GLUCERNA SHAKE, (GLUCERNA SHAKE) LIQD Take 237 mLs by mouth 3 (three) times daily between meals.  . ferrous sulfate 325 (65 FE) MG tablet Take 325 mg by mouth daily with breakfast.  . gabapentin (NEURONTIN) 100 MG capsule Take 1 capsule (100 mg total) by mouth at bedtime.  . insulin aspart (NOVOLOG FLEXPEN) 100 UNIT/ML FlexPen Inject 3 Units into the skin 3 (three) times daily with meals. At lunch (Patient taking differently: Inject 20 Units into the skin daily. At breakfast- sliding scale)  . Insulin Glargine (BASAGLAR KWIKPEN) 100 UNIT/ML INJECT 12 UNITS UNDER  THE SKIN DAILY  . losartan (COZAAR) 25 MG tablet Take 25 mg by mouth 2 (two) times daily. Unsure of dosage  . melatonin 5 MG TABS Take 0.5 tablets (2.5 mg total) by mouth at bedtime as needed (sleep).  . Multiple Vitamins-Minerals (PRESERVISION AREDS PO) Take 1 capsule by mouth 2 (two) times daily.   . promethazine-dextromethorphan (PROMETHAZINE-DM) 6.25-15 MG/5ML syrup Take 5 mLs by mouth 4 (four) times daily as needed for cough.  . rosuvastatin (CRESTOR) 5 MG tablet Take 5 mg by mouth every other day. Mon, Wed, Fri and Sat.  . torsemide (DEMADEX) 20 MG tablet Take 2 tablets (40 mg total) by mouth daily. (Patient taking differently: Take 40 mg by mouth 2 (two) times daily. 1 tablet in AM 1 tablet in PM)    PHQ 2/9 Scores 03/20/2021 12/25/2020  10/08/2020 09/04/2020  PHQ - 2 Score 1 0 3 0  PHQ- 9 Score 5 4 9  -    GAD 7 : Generalized Anxiety Score 03/20/2021 12/25/2020 10/08/2020 08/07/2020  Nervous, Anxious, on Edge 1 0 2 0  Control/stop worrying 0 0 2 0  Worry too much - different things 0 0 2 0  Trouble relaxing 0 0 0 0  Restless 0 0 0 0  Easily annoyed or irritable 0 0 0 2  Afraid - awful might happen 0 0 0 0  Total GAD 7 Score 1 0 6 2  Anxiety Difficulty - - - Not difficult at all    BP Readings from Last 3 Encounters:  03/20/21 (!) 136/42  12/25/20 (!) 142/78  10/08/20 (!) 132/40    Physical Exam Vitals and nursing note reviewed.  Constitutional:      General: She is not in acute distress.    Appearance: She is well-developed.  HENT:     Head: Normocephalic and atraumatic.     Right Ear: Tympanic membrane and ear canal normal.     Left Ear: Tympanic membrane and ear canal normal.     Nose:     Right Sinus: No maxillary sinus tenderness.     Left Sinus: No maxillary sinus tenderness.  Eyes:     General: No scleral icterus.       Right eye: No discharge.        Left eye: No discharge.     Conjunctiva/sclera: Conjunctivae normal.  Neck:     Thyroid: No thyromegaly.     Vascular: No carotid bruit.  Cardiovascular:     Rate and Rhythm: Normal rate and regular rhythm.     Pulses: Normal pulses.     Heart sounds: Normal heart sounds.  Pulmonary:     Effort: Pulmonary effort is normal. No respiratory distress.     Breath sounds: No wheezing.  Chest:  Breasts:     Right: No mass, nipple discharge, skin change or tenderness.     Left: No mass, nipple discharge, skin change or tenderness.    Abdominal:     General: Bowel sounds are normal.     Palpations: Abdomen is soft.     Tenderness: There is no abdominal tenderness.  Musculoskeletal:     Cervical back: Normal range of motion. No erythema.     Right lower leg: No edema.     Left lower leg: No edema.  Lymphadenopathy:     Cervical: No cervical  adenopathy.  Skin:    General: Skin is warm and dry.     Findings: No rash.  Neurological:     Mental  Status: She is alert and oriented to person, place, and time.     Cranial Nerves: No cranial nerve deficit.     Sensory: No sensory deficit.     Deep Tendon Reflexes: Reflexes are normal and symmetric.  Psychiatric:        Attention and Perception: Attention normal.        Mood and Affect: Mood normal.     Wt Readings from Last 3 Encounters:  03/20/21 120 lb (54.4 kg)  12/25/20 121 lb (54.9 kg)  10/08/20 120 lb (54.4 kg)    BP (!) 136/42   Pulse 65   Temp (!) 97.5 F (36.4 C) (Oral)   Ht 5' (1.524 m)   Wt 120 lb (54.4 kg)   SpO2 98%   BMI 23.44 kg/m   Assessment and Plan: 1. Essential hypertension Clinically stable exam with well controlled BP. Tolerating medications without side effects at this time. Pt to continue current regimen and low sodium diet; benefits of regular exercise as able discussed.  2. Type 2 diabetes mellitus with stage 3a chronic kidney disease, without long-term current use of insulin (HCC) Stable DM - A1C slightly increased recently. No vision changes, no skin ulcerations or rashes Followed by Endocrinology Foot exam done today - normal except for long nails  3. CKD stage 3 due to type 2 diabetes mellitus ([place]) Labs done in March show stable RI with GFR = 30  4. Hyperlipidemia associated with type 2 diabetes mellitus ([place]) Tolerating statin medication without side effects at this time LDL is at goal of < 70 on current dose Continue same therapy without change at this time.  5. AF (paroxysmal atrial fibrillation) ([place]) In SR today.  Continue Eliquis anticoagulation Follow up regularly with Cardiology  6. Acquired thrombophilia ([place]) Due to [place] therapy  7. Malnutrition of mild degree (HCC) Last albumin improved; weight has stabilized Continue to monitor  8. Chronic diastolic congestive heart failure (HCC) Stable without evidence of  fluid overload at this time.  9. Atherosclerosis of native artery of left lower extremity with intermittent claudication (HCC) Continue statin MWFS  10. Mood disorder (HCC) Mild symptoms primarily related to her husbands advancing dementia and chronic care needs  11. Exudative age-related macular degeneration of right eye, unspecified stage ([place]) Follow regularly with Ophthalmology.  12. Stable proliferative diabetic retinopathy of both eyes associated with type 2 diabetes mellitus ([place]) No longer driving due to vision changes   Partially dictated using Editor, commissioning. Any errors are unintentional.  [name], MD [facility]  03/20/2021

## 2021-03-28 DIAGNOSIS — M48062 Spinal stenosis, lumbar region with neurogenic claudication: Secondary | ICD-10-CM | POA: Diagnosis not present

## 2021-03-28 DIAGNOSIS — M5126 Other intervertebral disc displacement, lumbar region: Secondary | ICD-10-CM | POA: Diagnosis not present

## 2021-03-28 DIAGNOSIS — M5416 Radiculopathy, lumbar region: Secondary | ICD-10-CM | POA: Diagnosis not present

## 2021-04-03 ENCOUNTER — Telehealth: Payer: Self-pay | Admitting: Pharmacist

## 2021-04-03 NOTE — Chronic Care Management (AMB) (Signed)
Chronic Care Management Pharmacy Assistant   Name: Jill Hudson  MRN: [id number] DOB: 10/09/1935   Reason for Encounter: Disease State Diabetes Mellitus     Recent office visits:  03/20/21 [name], MD PCP- Annual exam.Foot exam completed. Return in 6 months.  Recent consult visits:  03/17/21 Whitney Meeler (Physical med)-Seen for DDD. Transforaminal Epidural Steroid Injection given.  Hospital visits:  None in previous 6 months  Medications: Outpatient Encounter Medications as of 04/03/2021  Medication Sig Note   acetaminophen (TYLENOL) 650 MG CR tablet Take 1,300 mg by mouth every 8 (eight) hours as needed for pain.    albuterol (VENTOLIN HFA) 108 (90 Base) MCG/ACT inhaler Inhale 2 puffs into the lungs every 6 (six) hours as needed for wheezing or shortness of breath.    amLODipine (NORVASC) 5 MG tablet Take 5 mg by mouth in the morning and at bedtime.    B-D ULTRAFINE III SHORT PEN 31G X 8 MM MISC USE AS DIRECTED    cloNIDine (CATAPRES) 0.2 MG tablet Take 1 tablet (0.2 mg total) by mouth 2 (two) times daily. Please check blood pressure prior to taking medication.  Only take if blood pressure is greater than 976 systolic. (Patient taking differently: Take 0.2 mg by mouth as needed. Please check blood pressure prior to taking medication.  Only take if blood pressure is greater than 734 systolic.)    Cyanocobalamin 1000 MCG TBCR Take 1,000 mcg by mouth daily.    ELIQUIS 2.5 MG TABS tablet TAKE 1 TABLET(2.5 MG) BY MOUTH TWICE DAILY    feeding supplement, GLUCERNA SHAKE, (GLUCERNA SHAKE) LIQD Take 237 mLs by mouth 3 (three) times daily between meals.    ferrous sulfate 325 (65 FE) MG tablet Take 325 mg by mouth daily with breakfast.    gabapentin (NEURONTIN) 100 MG capsule Take 1 capsule (100 mg total) by mouth at bedtime.    insulin aspart (NOVOLOG FLEXPEN) 100 UNIT/ML FlexPen Inject 3 Units into the skin 3 (three) times daily with meals. At lunch (Patient taking differently:  Inject 20 Units into the skin daily. At breakfast- sliding scale) 02/25/2021: 4 units tid AC meals  (increase by 1 unit if BG>250)   Insulin Glargine (BASAGLAR KWIKPEN) 100 UNIT/ML INJECT 12 UNITS UNDER THE SKIN DAILY 02/25/2021: Taking 5 units q am    losartan (COZAAR) 25 MG tablet Take 25 mg by mouth 2 (two) times daily. Unsure of dosage    melatonin 5 MG TABS Take 0.5 tablets (2.5 mg total) by mouth at bedtime as needed (sleep).    Multiple Vitamins-Minerals (PRESERVISION AREDS PO) Take 1 capsule by mouth 2 (two) times daily.     promethazine-dextromethorphan (PROMETHAZINE-DM) 6.25-15 MG/5ML syrup TAKE 5 ML BY MOUTH FOUR TIMES DAILY AS NEEDED FOR COUGH    rosuvastatin (CRESTOR) 5 MG tablet Take 5 mg by mouth every other day. Mon, Wed, Fri and Sat.    torsemide (DEMADEX) 20 MG tablet Take 2 tablets (40 mg total) by mouth daily. (Patient taking differently: Take 40 mg by mouth 2 (two) times daily. 1 tablet in AM 1 tablet in PM)    No facility-administered encounter medications on file as of 04/03/2021.   Recent Relevant Labs: Lab Results  Component Value Date/Time   HGBA1C 8.8 02/10/2021 12:00 AM   HGBA1C 7.7 09/30/2020 12:00 AM    Kidney Function Lab Results  Component Value Date/Time   CREATININE 1.65 (H) 12/25/2020 02:27 PM   CREATININE 1.42 (H) 07/20/2020 05:27 PM  CREATININE 1.10 05/29/2014 12:02 PM   CREATININE 0.99 09/09/2013 04:13 AM   GFRNONAA 34 (L) 07/20/2020 05:27 PM   GFRNONAA 48 (L) 05/29/2014 12:02 PM   GFRAA 39 (L) 07/20/2020 05:27 PM   GFRAA 56 (L) 05/29/2014 12:02 PM    Current antihyperglycemic regimen:  Novolog 4 units B/L 5 units at supper with meals, increase by 1 unit u if AC BG >250 ( Has only been taking 4 units with each meal) Basaglar 5 u qam  What recent interventions/DTPs have been made to improve glycemic control:  None noted  Have there been any recent hospitalizations or ED visits since last visit with CPP? No  Patient denies hypoglycemic symptoms,  including Pale, Sweaty, Shaky, Hungry, Nervous/irritable, and Vision changes  Patient reports hyperglycemic symptoms, including fatigue  How often are you checking your blood sugar? 3-4 times daily  What are your blood sugars ranging? Patient states she can not remember what her readings are. Fasting:  Before meals:  After meals:  Bedtime:  During the week, how often does your blood glucose drop below 70? Never  Are you checking your feet daily/regularly?  Patient states she checks her feet daily.  Adherence Review: Is the patient currently on a STATIN medication? Yes Is the patient currently on ACE/ARB medication? Yes Does the patient have >5 day gap between last estimated fill dates? No     Star Rating Drugs: Losartan 25 mg last filled 03/03/21 90 DS Rosuvastatin 5 mg last filled 03/16/21 112DS  [facility] Clinical Pharmacist Assistant [phone]

## 2021-04-11 DIAGNOSIS — Z1231 Encounter for screening mammogram for malignant neoplasm of breast: Secondary | ICD-10-CM | POA: Diagnosis not present

## 2021-04-15 ENCOUNTER — Ambulatory Visit (INDEPENDENT_AMBULATORY_CARE_PROVIDER_SITE_OTHER): Payer: Medicare Other | Admitting: General Practice

## 2021-04-15 ENCOUNTER — Other Ambulatory Visit: Payer: Self-pay | Admitting: Internal Medicine

## 2021-04-15 DIAGNOSIS — N1831 Chronic kidney disease, stage 3a: Secondary | ICD-10-CM

## 2021-04-15 DIAGNOSIS — I480 Paroxysmal atrial fibrillation: Secondary | ICD-10-CM

## 2021-04-15 DIAGNOSIS — I10 Essential (primary) hypertension: Secondary | ICD-10-CM | POA: Diagnosis not present

## 2021-04-15 DIAGNOSIS — E1122 Type 2 diabetes mellitus with diabetic chronic kidney disease: Secondary | ICD-10-CM | POA: Diagnosis not present

## 2021-04-15 DIAGNOSIS — I5032 Chronic diastolic (congestive) heart failure: Secondary | ICD-10-CM

## 2021-04-15 NOTE — Chronic Care Management (AMB) (Signed)
Chronic Care Management   CCM RN Visit Note  04/15/2021 Name: Jill Hudson MRN: [id number] DOB: 05-13-1935  Subjective: Jill Hudson is a 86 y.o. year old female who is a primary care patient of [name], MD. The care management team was consulted for assistance with disease management and care coordination needs.    Engaged with patient by telephone for follow up visit in response to provider referral for case management and/or care coordination services.   Consent to Services:  The patient was given information about Chronic Care Management services, agreed to services, and gave verbal consent prior to initiation of services.  Please see initial visit note for detailed documentation.   Patient agreed to services and verbal consent obtained.   Assessment: Review of patient past medical history, allergies, medications, health status, including review of consultants reports, laboratory and other test data, was performed as part of comprehensive evaluation and provision of chronic care management services.   SDOH (Social Determinants of Health) assessments and interventions performed:    CCM Care Plan  Allergies  Allergen Reactions   Saxagliptin Diarrhea   Epinephrine Other (See Comments)    Patient does not remember what happens when she uses this   Atorvastatin Other (See Comments)    Muscle aches   Codeine Other (See Comments)    Upset stomach   Ezetimibe Other (See Comments)    Myalgias(ZETIA)   Limonene Rash    Patient does not recall this reaction   Nitrofurantoin Rash and Other (See Comments)    Pruitus   Sulfa Antibiotics Rash and Other (See Comments)    Sore mouth     Outpatient Encounter Medications as of 04/15/2021  Medication Sig Note   acetaminophen (TYLENOL) 650 MG CR tablet Take 1,300 mg by mouth every 8 (eight) hours as needed for pain.    albuterol (VENTOLIN HFA) 108 (90 Base) MCG/ACT inhaler Inhale 2 puffs into the lungs every 6 (six) hours as  needed for wheezing or shortness of breath.    amLODipine (NORVASC) 5 MG tablet Take 5 mg by mouth in the morning and at bedtime.    B-D ULTRAFINE III SHORT PEN 31G X 8 MM MISC USE AS DIRECTED    cloNIDine (CATAPRES) 0.2 MG tablet Take 1 tablet (0.2 mg total) by mouth 2 (two) times daily. Please check blood pressure prior to taking medication.  Only take if blood pressure is greater than 409 systolic. (Patient taking differently: Take 0.2 mg by mouth as needed. Please check blood pressure prior to taking medication.  Only take if blood pressure is greater than 811 systolic.)    Cyanocobalamin 1000 MCG TBCR Take 1,000 mcg by mouth daily.    ELIQUIS 2.5 MG TABS tablet TAKE 1 TABLET(2.5 MG) BY MOUTH TWICE DAILY    feeding supplement, GLUCERNA SHAKE, (GLUCERNA SHAKE) LIQD Take 237 mLs by mouth 3 (three) times daily between meals.    ferrous sulfate 325 (65 FE) MG tablet Take 325 mg by mouth daily with breakfast.    gabapentin (NEURONTIN) 100 MG capsule Take 1 capsule (100 mg total) by mouth at bedtime.    insulin aspart (NOVOLOG FLEXPEN) 100 UNIT/ML FlexPen Inject 3 Units into the skin 3 (three) times daily with meals. At lunch (Patient taking differently: Inject 20 Units into the skin daily. At breakfast- sliding scale) 02/25/2021: 4 units tid AC meals  (increase by 1 unit if BG>250)   Insulin Glargine (BASAGLAR KWIKPEN) 100 UNIT/ML INJECT 12 UNITS UNDER THE SKIN  DAILY 02/25/2021: Taking 5 units q am    losartan (COZAAR) 25 MG tablet Take 25 mg by mouth 2 (two) times daily. Unsure of dosage    melatonin 5 MG TABS Take 0.5 tablets (2.5 mg total) by mouth at bedtime as needed (sleep).    Multiple Vitamins-Minerals (PRESERVISION AREDS PO) Take 1 capsule by mouth 2 (two) times daily.     promethazine-dextromethorphan (PROMETHAZINE-DM) 6.25-15 MG/5ML syrup TAKE 5 ML BY MOUTH FOUR TIMES DAILY AS NEEDED FOR COUGH    rosuvastatin (CRESTOR) 5 MG tablet Take 5 mg by mouth every other day. Mon, Wed, Fri and Sat.     torsemide (DEMADEX) 20 MG tablet Take 2 tablets (40 mg total) by mouth daily. (Patient taking differently: Take 40 mg by mouth 2 (two) times daily. 1 tablet in AM 1 tablet in PM)    No facility-administered encounter medications on file as of 04/15/2021.    Patient Active Problem List   Diagnosis Date Noted   Acquired thrombophilia ([place]) 03/20/2021   Mood disorder ([place]) 05/02/2020   Chronic heart failure with preserved ejection fraction ([place]) 04/12/2020   Diverticulosis of large intestine without perforation or abscess with bleeding 03/27/2020   AF (paroxysmal atrial fibrillation) ([place])    Thrush    COPD (chronic obstructive pulmonary disease) with chronic bronchitis ([place])    CKD (chronic kidney disease), stage IIIa 02/18/2020   Malnutrition of mild degree ([place]) 01/08/2020   Age-related macular degeneration, dry, left eye 06/21/2019   Age-related macular degeneration, wet, right eye ([place]) 06/21/2019   Moderate nonproliferative diabetic retinopathy associated with type 2 diabetes mellitus ([place]) 06/21/2019   Lumbosacral radiculopathy at L4 05/01/2019   Underweight 05/01/2019   Encounter for long-term (current) use of aspirin 10/31/2018   Encounter for long-term (current) use of antiplatelets/antithrombotics 10/31/2018   Long term current use of oral hypoglycemic drug 10/31/2018   Encounter for long-term (current) use of insulin ([place]) 10/31/2018   GIB (gastrointestinal bleeding) 08/11/2018   Leg pain 07/03/2017   Carpal tunnel syndrome on both sides 05/05/2017   Myalgia due to HMG CoA reductase inhibitor 05/05/2017   History of CVA (cerebrovascular accident) 02/15/2017   Degenerative disc disease, lumbar 12/21/2016   Atherosclerosis of native arteries of extremity with intermittent claudication ([place]) 12/20/2016   Bilateral carotid artery stenosis 12/20/2016   Occlusion and stenosis of bilateral carotid arteries 12/20/2016   Hip bursitis 05/18/2016   Elevated TSH 01/18/2016   CKD stage  3 due to type 2 diabetes mellitus ([place]) 01/16/2016   Senile ecchymosis 01/16/2016   Type II diabetes mellitus with renal manifestations ([place]) 09/16/2015   GERD (gastroesophageal reflux disease) 07/24/2015   PVD (peripheral vascular disease) ([place]) 05/17/2015   Neoplasm of uncertain behavior of skin 05/17/2015   Retinopathy, diabetic, proliferative ([place]) 04/11/2015   Hyperlipidemia associated with type 2 diabetes mellitus ([place]) 04/11/2015   Essential hypertension 04/11/2015   Generalized OA 04/11/2015   Proliferative diabetic retinopathy(362.02) 04/11/2015   Arteriosclerosis of coronary artery 05/29/2013   Hypertensive heart disease without CHF 05/29/2013    Conditions to be addressed/monitored:CHF, HTN, and DMII  Care Plan : RNCM: Heart Failure (Adult)  Updates made by [name] since 04/15/2021 12:00 AM     Problem: RNCM: Symptom Exacerbation (Heart Failure)   Priority: Medium     Long-Range Goal: RNCM: HF Symptom Exacerbation Prevented or Minimized   Priority: Medium  Note:   Current Barriers:  Knowledge deficits related to basic heart failure pathophysiology and self care management Does not  contact provider office for questions/concerns Lack of scale in home Financial strain Nurse Case Manager Clinical Goal(s):  patient will weigh self daily and record patient will verbalize understanding of Heart Failure Action Plan and when to call doctor patient will take all Heart Failure mediations as prescribed Interventions:  Collaboration with [name], MD regarding development and update of comprehensive plan of care as evidenced by provider attestation and co-signature Inter-disciplinary care team collaboration (see longitudinal plan of care) Basic overview and discussion of pathophysiology of Heart Failure Provided written and verbal education on low sodium diet Reviewed Heart Failure Action Plan in depth and provided written copy Assessed for scales in  home Discussed importance of daily weight Reviewed role of diuretics in prevention of fluid overload Self-Care Activities:  Takes Heart Failure Medications as prescribed. States compliance with medications.  Weighs daily and record (notifying MD of 3 lb weight gain over night or 5 lb in a week). States her weright today was 115. Usual weight is 115 to 120 Verbalizes understanding of and follows CHF Action Plan. Denies any edema or swelling. Has not had hospitalization for HF >6 months. Knows what to look for for sx and sx of exacerbation. Consistent with daily weights.  Adheres to low sodium diet. Patient states she does not have an appetite but she monitors her weight and is eating best as she can.  Patient Goals:  - Take Heart Failure Medications as prescribed - Weigh daily and record (notify MD with 3 lb weight gain over night or 5 lb in a week) - Follow CHF Action Plan - Adhere to low sodium diet - develop a rescue plan - eat more whole grains, fruits and vegetables, lean meats and healthy fats - follow rescue plan if symptoms flare-up - know when to call the doctor - track symptoms and what helps feel better or worse - dress right for the weather, hot or cold - avoid heavy exercise on very hot days - drink water to stay hydrated during exercise - follow activity or exercise plan - make an activity or exercise plan - pace activity allowing for rest - warm up and cool down for 10 minutes before and after exercise - call office if I gain more than 2 pounds in one day or 5 pounds in one week - keep legs up while sitting - track weight in diary - use salt in moderation - watch for swelling in feet, ankles and legs every day - weigh myself daily - barriers to lifestyle changes reviewed and addressed - barriers to treatment reviewed and addressed - cognitive screening completed and reviewed - depression screen reviewed - health literacy screening completed or reviewed - healthy  lifestyle promoted - medication-adherence assessment completed - rescue (action) plan developed - rescue (action) plan reviewed - self-awareness of signs/symptoms of worsening disease encouraged Follow Up Plan: Telephone follow up appointment with care management team member scheduled for: next quarter     Task: RNCM: Identify and Minimize Risk of Heart Failure Exacerbation   Note:   Care Management Activities:    - barriers to lifestyle changes reviewed and addressed - barriers to treatment reviewed and addressed - cognitive screening completed and reviewed - depression screen reviewed - health literacy screening completed or reviewed - healthy lifestyle promoted - medication-adherence assessment completed - rescue (action) plan developed - rescue (action) plan reviewed - self-awareness of signs/symptoms of worsening disease encouraged        Care Plan : RNCM: Diabetes Type 2 (  Adult)  Updates made by [name] since 04/15/2021 12:00 AM     Problem: RNCM: Glycemic Management (Diabetes, Type 2)   Priority: Medium     Long-Range Goal: RNCM: Glycemic Management Optimized   Priority: Medium  Note:   Objective:  Lab Results  Component Value Date   HGBA1C 8.8 02/10/2021   Lab Results  Component Value Date   CREATININE 1.65 (H) 12/25/2020   CREATININE 1.42 (H) 07/20/2020   CREATININE 1.57 (H) 05/02/2020   Lab Results  Component Value Date   EGFR 30 (L) 12/25/2020   Current Barriers:  Knowledge Deficits related to basic Diabetes pathophysiology and self care/management Knowledge Deficits related to medications used for management of diabetes Unable to independently manage DM Does not contact provider office for questions/concerns Case Manager Clinical Goal(s):  patient will demonstrate improved adherence to prescribed treatment plan for diabetes self care/management as evidenced by: daily monitoring and recording of CBG  adherence to ADA/ carb modified diet  exercise 4/5 days/week adherence to prescribed medication regimen contacting provider for new or worsened symptoms or questions Interventions:  Collaboration with [name], MD regarding development and update of comprehensive plan of care as evidenced by provider attestation and co-signature Inter-disciplinary care team collaboration (see longitudinal plan of care) Provided education to patient about basic DM disease process Reviewed medications with patient and discussed importance of medication adherence Discussed plans with patient for ongoing care management follow up and provided patient with direct contact information for care management team Provided patient with written educational materials related to hypo and hyperglycemia and importance of correct treatment Advised patient, providing education and rationale, to check cbg BID and record, calling pcp/endocrinologist  for findings outside established parameters.   Review of patient status, including review of consultants reports, relevant laboratory and other test results, and medications completed. Self-Care Activities - Self administers oral medications as prescribed Attends all scheduled provider appointments Checks blood sugars as prescribed and utilize hyper and hypoglycemia protocol as needed Adheres to prescribed ADA/carb modified Patient Goals: - check blood sugar at prescribed times - check blood sugar before and after exercise - check blood sugar if I feel it is too high or too low - enter blood sugar readings and medication or insulin into daily log - take the blood sugar log to all doctor visits - change to whole grain breads, cereal, pasta - set goal weight - drink 6 to 8 glasses of water each day - fill half of plate with vegetables - limit fast food meals to no more than 1 per week - manage portion size - prepare main meal at home 3 to 5 days each week - read food labels for fat, fiber, carbohydrates and  portion size - reduce red meat to 2 to 3 times a week - schedule appointment with eye doctor - check feet daily for cuts, sores or redness - keep feet up while sitting - trim toenails straight across - wash and dry feet carefully every day - wear comfortable, cotton socks - wear comfortable, well-fitting shoes - barriers to adherence to treatment plan identified - blood glucose monitoring encouraged - blood glucose readings reviewed - mutual A1C goal set or reviewed - resources required to improve adherence to care identified - self-awareness of signs/symptoms of hypo or hyperglycemia encouraged - use of blood glucose monitoring log promoted Follow Up Plan: Telephone follow up appointment with care management team member scheduled for: next quarter    Task: RNCM: Alleviate Barriers to  Glycemic Management   Note:   Care Management Activities:    - barriers to adherence to treatment plan identified - blood glucose monitoring encouraged - blood glucose readings reviewed - mutual A1C goal set or reviewed - resources required to improve adherence to care identified - self-awareness of signs/symptoms of hypo or hyperglycemia encouraged - use of blood glucose monitoring log promoted        Care Plan : RNCM: Hypertension (Adult)  Updates made by [name] since 04/15/2021 12:00 AM     Problem: RNCM: Hypertension (Hypertension)   Priority: Medium     Long-Range Goal: RNCM: Hypertension Monitored   Priority: Medium  Note:   Objective:  Last practice recorded BP readings:  BP Readings from Last 3 Encounters:  03/20/21 (!) 136/42  12/25/20 (!) 142/78  10/08/20 (!) 132/40   Most recent eGFR/CrCl:  Lab Results  Component Value Date   EGFR 30 (L) 12/25/2020    No components found for: CRCL Current Barriers:  Knowledge Deficits related to basic understanding of hypertension pathophysiology and self care management Knowledge Deficits related to understanding of  medications prescribed for management of hypertension Unable to independently manage HTN Does not contact provider office for questions/concerns Case Manager Clinical Goal(s):  patient will verbalize understanding of plan for hypertension management patient will demonstrate improved adherence to prescribed treatment plan for hypertension as evidenced by taking all medications as prescribed, monitoring and recording blood pressure as directed, adhering to low sodium/DASH diet patient will demonstrate improved health management independence as evidenced by checking blood pressure as directed and notifying PCP if SBP>160 or DBP > 90, taking all medications as prescribe, and adhering to a low sodium diet as discussed. patient will verbalize basic understanding of hypertension disease process and self health management plan as evidenced by compliance with medications, compliance with heart healthy/ADA diet and working with the CCM team to optimize health and well being.  Interventions:  Collaboration with [name], MD regarding development and update of comprehensive plan of care as evidenced by provider attestation and co-signature Inter-disciplinary care team collaboration (see longitudinal plan of care) Evaluation of current treatment plan related to hypertension self management and patient's adherence to plan as established by provider. Provided education to patient re: stroke prevention, s/s of heart attack and stroke, DASH diet, complications of uncontrolled blood pressure Reviewed medications with patient and discussed importance of compliance Discussed plans with patient for ongoing care management follow up and provided patient with direct contact information for care management team Advised patient, providing education and rationale, to monitor blood pressure daily and record, calling PCP for findings outside established parameters.  Self-Care Activities: - Self administers medications  as prescribed Attends all scheduled provider appointments Calls provider office for new concerns, questions, or BP outside discussed parameters Checks BP and records as discussed Follows a low sodium diet/DASH diet Patient Goals: - check blood pressure weekly - choose a place to take my blood pressure (home, clinic or office, retail store) - write blood pressure results in a log or diary - agree on reward when goals are met - agree to work together to make changes - ask questions to understand - have a family meeting to talk about healthy habits - learn about high blood pressure - blood pressure trends reviewed - depression screen reviewed - home or ambulatory blood pressure monitoring encouraged Follow Up Plan: Telephone follow up appointment with care management team member scheduled for: next quarter     Task: RNCM:  Identify and Monitor Blood Pressure Elevation   Note:   Care Management Activities:    - blood pressure trends reviewed - depression screen reviewed - home or ambulatory blood pressure monitoring encouraged         Plan:The care management team will reach out to the patient again over the next 60 to 90 days.  Noreene Larsson RN, MSN, [facility] [place] Mobile: [phone]

## 2021-04-15 NOTE — Patient Instructions (Signed)
Visit Information  PATIENT GOALS:  Goals Addressed               This Visit's Progress     COMPLETED: RNCM: Pt-"I had to go because i had a bad cough" (pt-stated)        Current Barriers: Closing this goal. See new ELS Knowledge deficit related to basic COPD self care/management Knowledge deficit related to basic understanding of how to use inhalers and how inhaled medications work Knowledge deficit related to importance of energy conservation   Nurse Case Manager Clinical Goal(s): Over the next 120 days patient will report using inhalers as prescribed including rinsing mouth after use Over the next 120 days patient will report utilizing pursed lip breathing for shortness of breath Over the next 120 days, patient will be able to verbalize understanding of COPD action plan and when to seek appropriate levels of medical care Over the next 120 days, patient will engage in lite exercise as tolerated to build/regain stamina and strength and reduce shortness of breath through activity tolerance   Interventions:  Provided patient with basic written and verbal COPD education on self care/management/and exacerbation prevention  Provided patient with COPD action plan and reinforced importance of daily self assessment Evaluation of COPD exacerbation/Conditions. The patient states that she is doing well. Denies any acute distress related to her COPD. Feels it is stable at this time. Is compliant with medications.  Provided written and verbal instructions on pursed lip breathing and utilized returned demonstration as teach back Evaluation of appointment with pcp. The patient has an appointment with pcp on 03-20-2021. The patient has several specialist appointments coming up.  Patient Self Care Activities:  Take medications as prescribed including inhalers Practice and use pursed lip breathing for shortness of breath recovery and prevention Self assess COPD action plan zone and make appointment with  provider if you have been in the yellow zone for 48 hours without improvement. Engage in lite exercise as tolerated 3-5 days a week Utilize infection prevention strategies to reduce risk of respiratory infection          COMPLETED: RNCM: Pt-"I have to watch my weight closely" (pt-stated)        CARE PLAN ENTRY (see longitudinal plan of care for additional care plan information)  Current Barriers: closing this goal. See new ELS Knowledge deficit related to basic heart failure pathophysiology and self care management Post discharge from the hospital on 02-26-2020 for patient stated "too much water in me"   Nurse Case Manager Clinical Goal(s):  Over the next 120 days, patient will weigh self daily and record Over the next 120 days, patient will verbalize understanding of Heart Failure Action Plan and when to call doctor Over the next 120 days, patient will take all Heart Failure mediations as prescribed  Interventions:  Basic overview and discussion of pathophysiology of Heart Failure Provided written and verbal education on low sodium diet.  Will send information by My Chart and EMMI about HF and how to prevent exacerbation. The patient endorses a heart healthy/ADA diet.  She is sad that she can not eat any more hot dogs; however she was able to tell the RNCM healthy food choices to maintain her health and well being with HF/HTN/HLD/DM.  Review how eating salt causes the water to follow in the body. The patient reported cough and swelling in her left leg. 08-22-2020: The patient is doing well and says her ankles are "slim".  Sees the cardiologist in the coming months.  Denies any issues with edema at this time. Will continue to monitor.  Reviewed Heart Failure Action Plan in depth.  The patient verbalized she is to call her provider if her weight is less than 113 or greater than 116.  She had follow up with the cardiologist yesterday. She said the cardiology is pleased with her post hospital  evaluation. The patient is also taking her blood pressure daily.  07-05-2020: The patients  weight today was 112. Education provided on calling the provider for weight gain of >3 pounds in one day or >5 in one week. She is being mindful of water weight. 08-22-2020: The patients weight in the office recently was 119.  The patient had clothes on. The patient denies any acute distress.  Assessed for scales in home.  Patient endorses working scales Discussed importance of daily weight.  Education on weighing first thing every am after first void, with similar clothes for most accurate weight.  Have scales on flat surface.  Call the provider for outside given parameters of wt <113 or >116. Education given to patient on  the importance of weighing daily so subtle changes can be picked up on and intervention made if needed by the provider. 07-05-2020: The patient endorses weighing daily and most of the time BID.   Reviewed role of diuretics in prevention of fluid overload.  Education on taking medications as directed. Reviewed safety when taking diuretics.    Patient Self Care Activities:  Take Heart Failure Medications as prescribed Weigh daily and record (notify MD with 3 lb weight gain over night or 5 lb in a week) Per the patient the MD wants her weight range to be 113-116. Follow CHF Action Plan Adhere to low sodium diet  Please see past updates related to this goal by clicking on the "Past Updates" button in the selected goal         COMPLETED: RNCM: Pt-"My blood pressure was high this am so I called the doctor"        [place] (see longtitudinal plan of care for additional care plan information)  Current Barriers: Closing this goal and opening in new ELS  Chronic Disease Management support, education, and care coordination needs related to HTN, HLD, DMII, and CKD Stage 3  Clinical Goal(s) related to HTN, HLD, DMII, and CKD Stage 3 :  Over the next 120 days, patient will:  Work with the  care management team to address educational, disease management, and care coordination needs  Begin or continue self health monitoring activities as directed today Measure and record cbg (blood glucose) 3 times daily, Measure and record blood pressure 5 times per week, and adhere to a Heart healthy/ADA diet Call provider office for new or worsened signs and symptoms Blood glucose findings outside established parameters, Blood pressure findings outside established parameters, and New or worsened symptom related to CKD3 and HLD Call care management team with questions or concerns Verbalize basic understanding of patient centered plan of care established today  Interventions related to HTN, HLD, DMII, and CKD Stage 3 :  Evaluation of current treatment plans and patient's adherence to plan as established by provider.  08-22-2020: the patient states she has been having elevations in her blood pressure. She has a prn medication to take with parameters. This am her systolic pressure was 106 and she could not remember the bottom part. The patient had a slight headache.  The patient says that she has been having up and down blood pressures.  Stress does cause it to go higher.   Assessed patient understanding of disease states.  The patient verbalized understanding of chronic conditions and endorses compliance with the plan of care Assessed patient's education and care coordination needs. Review of blood sugar readings. 07-05-2020: The patient verbalized fast this am was 109. Education on fasting blood sugars as <130 and post pradial <180.  The patients last hemoglobin A1C recorded was 6.6 but she states at the [facility] it is over 8.0.  she has upcoming appointment with the endocrinologist. Discussed the role of steroids causing blood sugars to be elevated. The patient verbalized understanding.   Review of blood pressure this am and her reading was 125/45- denies any issues with orthostatic hypotension at this time.  Reviewed safety when ambulating. 08-22-2020: The patient states her blood pressures have been up and down. The patient says the systolic is usually 094-709 but has been as high as 190, with it being 175 this am. States the bottom number is usually 50-60. States they may have to adjust her medications for her blood pressures. The patient states that she was trying to determine what she did yesterday to make it so high. Education on managing stress and anxiety. She says sometimes it is elevated because of her husband and his illness and how it impacts her.  Provided disease specific education to patient- review of Heart healthy/ADA diet, the patient states she has a good appetite just does not have good taste except for sweets. Education and support. [name] with appropriate clinical care team members regarding patient needs.  Denies needs from the CCM LCSW at this time. Is currently working with the CCM pharmacist.  Evaluation of leg pain.  The patient states that she is having leg pain from a fall. Working with neurologist. They are trying to figure out a plan to best help her. The patient does not want to do surgery. Will discuss with neurologist. Denies any new falls. Review of safety. 08-22-2020: the patient denies any new falls. Is utilizing her walker and doing well.  Patient Self Care Activities related to HTN, HLD, DMII, and CKD Stage 3 :  Patient is unable to independently self-manage chronic health conditions  Please see past updates related to this goal by clicking on the "Past Updates" button in the selected goal          Patient Care Plan: CCM Pharmacy Care Plan     Problem Identified: HTN, DM, Afib, chronic pain   Priority: High     Long-Range Goal: Disease Management   Start Date: 12/12/2020  This Visit's Progress: On track  Recent Progress: On track  Priority: High  Note:   Current Barriers:  Unable to independently monitor therapeutic efficacy Unable to maintain  control of diabetes   Pharmacist Clinical Goal(s):  Over the next 90 days, patient will achieve control of diabetes as evidenced by HbA1c and SMBG through collaboration with PharmD and provider.  Remove barriers to medication  Interventions: 1:1 collaboration with [name], MD regarding development and update of comprehensive plan of care as evidenced by provider attestation and co-signature Inter-disciplinary care team collaboration (see longitudinal plan of care) Comprehensive medication review performed; medication list updated in electronic medical record  Hypertension/ CKD (BP goal <130/80) -uncontrolled -Current treatment: Losartan 25 mg bid Amlodipine 5 mg bid Torsemide 10 mg bid Clonidine 0.1 mg up to bid for SBP>180 -Medications previously tried: NA -Current home readings: 110-140s over 40s to 50s -Denies hypotensive/hypertensive symptoms -Educated  on Daily salt intake goal < 2300 mg; Importance of home blood pressure monitoring; Symptoms of hypotension and importance of maintaining adequate hydration; -Counseled to monitor BP at home daily, document, and provide log at future appointments -Counseled on diet and exercise extensively Recommended to continue current medication    Hyperlipidemia, coronary arteriosclerosis/ carotid stenosis bilat: (LDL goal < 70) -controlled -Current treatment: Rosuvastatin 5 mg 4 days weekly -Medications previously tried: NA -Educated on Cholesterol goals;  Benefits of statin for ASCVD risk reduction; Exercise goal of 150 minutes per week; -Recommended to continue current medication Lab Results  Component Value Date   HGBA1C 7.7 09/30/2020  8.8 at endo in March  Diabetes (A1c goal <7.5%) -uncontrolled -Current medications: Novolog 4 units B/L 5 units at supper with meals, increase by 1 unit u if AC BG >250 ( Has only been taking 4 units with each meal) Basaglar 5 u qam  Gabapentin 100 mg qhs -Medications previously  tried: saxagliptin- diarrhea  -Current home glucose readings Using Freestyle Libre fasting glucose: < 200, one reading of 67 post prandial glucose: 300s -Reports hypoglycemic/hyperglycemic symptoms. Felt "woozy" when her BG was 67 -Current meal patterns: follows heart healthy, low-sodium diet Dinner:protein and veggies snacks: apple before bed drinks: water -Current exercise: dances to music channels daily, walks around at home -[employer] and blood sugar goals; Prevention and management of hypoglycemic episodes; Continuous glucose monitoring; -Counseled to check feet daily and get yearly eye exams -Counseled on diet and exercise extensively Educated on effects of steroid injection on BG and increasing insulin as instructed by endo has been elevated Recommend she follow SS for mealtime coverage from endo 1 extra for BG 200-250, 2 if BG 251-300,    Atrial Fibrillation (Goal: prevent stroke and major bleeding) -controlled  -CHADSVASC: 7 -Current treatment:  Anticoagulation: Eliquis 2.5 mg bid -Medications previously tried: NA -Counseled on increased risk of stroke due to Afib and benefits of anticoagulation for stroke prevention; importance of adherence to anticoagulant exactly as prescribed; seeking medical attention after a head injury or if there is blood in the urine/stool; -Recommended to continue current medication  Heart Failure (Goal: control symptoms and prevent exacerbations) controlled Type: Diastolic -NYHA Class: II (slight limitation of activity) -Ejection fraction: 55-60%(Date: 02/19/20) -Current treatment: Torsemide 10 mg bid prn -Medications previously tried: na  DDD  Lumbosacral pain(Goal: improve QOL  & decrease symptoms) -Not ideally controlled -Current treatment  Acetaminophen prn Gabapentin 100 mg qhs ESI injection 12/10/20 -Medications previously tried: baclofen  -Counseled on effects of steroids on BG Recommend she follow SS for mealtime coverage  from endo 1 extra for BG 200-250, 2 if BG 251-300,  -Educated on Importance of weighing daily; if you gain more than 3 pounds in one day or 5 pounds in one week, Call Cardiology or PCP -   Patient Goals/Self-Care Activities Over the next 90 days, patient will:  - take medications as prescribed check glucose multiple times daily, document, and provide at future appointments check blood pressure daily, document, and provide at future appointments weigh daily, and contact provider if weight gain of 3 lbs overnight  Follow Up Plan: Telephone follow up appointment with care management team member scheduled for: 3 months PharmD , CPA 6-8 weeks    Patient Care Plan: RNCM: Heart Failure (Adult)     Problem Identified: RNCM: Symptom Exacerbation (Heart Failure)   Priority: Medium     Long-Range Goal: RNCM: HF Symptom Exacerbation Prevented or Minimized   Priority: Medium  Note:  Current Barriers:  Knowledge deficits related to basic heart failure pathophysiology and self care management Does not contact provider office for questions/concerns Lack of scale in home Financial strain Nurse Case Manager Clinical Goal(s):  patient will weigh self daily and record patient will verbalize understanding of Heart Failure Action Plan and when to call doctor patient will take all Heart Failure mediations as prescribed Interventions:  Collaboration with [name], MD regarding development and update of comprehensive plan of care as evidenced by provider attestation and co-signature Inter-disciplinary care team collaboration (see longitudinal plan of care) Basic overview and discussion of pathophysiology of Heart Failure Provided written and verbal education on low sodium diet Reviewed Heart Failure Action Plan in depth and provided written copy Assessed for scales in home Discussed importance of daily weight Reviewed role of diuretics in prevention of fluid overload Self-Care Activities:   Takes Heart Failure Medications as prescribed. States compliance with medications.  Weighs daily and record (notifying MD of 3 lb weight gain over night or 5 lb in a week). States her weright today was 115. Usual weight is 115 to 120 Verbalizes understanding of and follows CHF Action Plan. Denies any edema or swelling. Has not had hospitalization for HF >6 months. Knows what to look for for sx and sx of exacerbation. Consistent with daily weights.  Adheres to low sodium diet. Patient states she does not have an appetite but she monitors her weight and is eating best as she can.  Patient Goals:  - Take Heart Failure Medications as prescribed - Weigh daily and record (notify MD with 3 lb weight gain over night or 5 lb in a week) - Follow CHF Action Plan - Adhere to low sodium diet - develop a rescue plan - eat more whole grains, fruits and vegetables, lean meats and healthy fats - follow rescue plan if symptoms flare-up - know when to call the doctor - track symptoms and what helps feel better or worse - dress right for the weather, hot or cold - avoid heavy exercise on very hot days - drink water to stay hydrated during exercise - follow activity or exercise plan - make an activity or exercise plan - pace activity allowing for rest - warm up and cool down for 10 minutes before and after exercise - call office if I gain more than 2 pounds in one day or 5 pounds in one week - keep legs up while sitting - track weight in diary - use salt in moderation - watch for swelling in feet, ankles and legs every day - weigh myself daily - barriers to lifestyle changes reviewed and addressed - barriers to treatment reviewed and addressed - cognitive screening completed and reviewed - depression screen reviewed - health literacy screening completed or reviewed - healthy lifestyle promoted - medication-adherence assessment completed - rescue (action) plan developed - rescue (action) plan  reviewed - self-awareness of signs/symptoms of worsening disease encouraged Follow Up Plan: Telephone follow up appointment with care management team member scheduled for: next quarter     Task: RNCM: Identify and Minimize Risk of Heart Failure Exacerbation   Note:   Care Management Activities:    - barriers to lifestyle changes reviewed and addressed - barriers to treatment reviewed and addressed - cognitive screening completed and reviewed - depression screen reviewed - health literacy screening completed or reviewed - healthy lifestyle promoted - medication-adherence assessment completed - rescue (action) plan developed - rescue (action) plan reviewed - self-awareness of signs/symptoms of  worsening disease encouraged        Patient Care Plan: RNCM: Diabetes Type 2 (Adult)     Problem Identified: RNCM: Glycemic Management (Diabetes, Type 2)   Priority: Medium     Long-Range Goal: RNCM: Glycemic Management Optimized   Priority: Medium  Note:   Objective:  Lab Results  Component Value Date   HGBA1C 8.8 02/10/2021   Lab Results  Component Value Date   CREATININE 1.65 (H) 12/25/2020   CREATININE 1.42 (H) 07/20/2020   CREATININE 1.57 (H) 05/02/2020   Lab Results  Component Value Date   EGFR 30 (L) 12/25/2020   Current Barriers:  Knowledge Deficits related to basic Diabetes pathophysiology and self care/management Knowledge Deficits related to medications used for management of diabetes Unable to independently manage DM Does not contact provider office for questions/concerns Case Manager Clinical Goal(s):  patient will demonstrate improved adherence to prescribed treatment plan for diabetes self care/management as evidenced by: daily monitoring and recording of CBG  adherence to ADA/ carb modified diet exercise 4/5 days/week adherence to prescribed medication regimen contacting provider for new or worsened symptoms or questions Interventions:  Collaboration with  [name], MD regarding development and update of comprehensive plan of care as evidenced by provider attestation and co-signature Inter-disciplinary care team collaboration (see longitudinal plan of care) Provided education to patient about basic DM disease process Reviewed medications with patient and discussed importance of medication adherence Discussed plans with patient for ongoing care management follow up and provided patient with direct contact information for care management team Provided patient with written educational materials related to hypo and hyperglycemia and importance of correct treatment Advised patient, providing education and rationale, to check cbg BID and record, calling pcp/endocrinologist  for findings outside established parameters.   Review of patient status, including review of consultants reports, relevant laboratory and other test results, and medications completed. Self-Care Activities - Self administers oral medications as prescribed Attends all scheduled provider appointments Checks blood sugars as prescribed and utilize hyper and hypoglycemia protocol as needed Adheres to prescribed ADA/carb modified Patient Goals: - check blood sugar at prescribed times - check blood sugar before and after exercise - check blood sugar if I feel it is too high or too low - enter blood sugar readings and medication or insulin into daily log - take the blood sugar log to all doctor visits - change to whole grain breads, cereal, pasta - set goal weight - drink 6 to 8 glasses of water each day - fill half of plate with vegetables - limit fast food meals to no more than 1 per week - manage portion size - prepare main meal at home 3 to 5 days each week - read food labels for fat, fiber, carbohydrates and portion size - reduce red meat to 2 to 3 times a week - schedule appointment with eye doctor - check feet daily for cuts, sores or redness - keep feet up while  sitting - trim toenails straight across - wash and dry feet carefully every day - wear comfortable, cotton socks - wear comfortable, well-fitting shoes - barriers to adherence to treatment plan identified - blood glucose monitoring encouraged - blood glucose readings reviewed - mutual A1C goal set or reviewed - resources required to improve adherence to care identified - self-awareness of signs/symptoms of hypo or hyperglycemia encouraged - use of blood glucose monitoring log promoted Follow Up Plan: Telephone follow up appointment with care management team member scheduled for: next quarter  Task: RNCM: Alleviate Barriers to Glycemic Management   Note:   Care Management Activities:    - barriers to adherence to treatment plan identified - blood glucose monitoring encouraged - blood glucose readings reviewed - mutual A1C goal set or reviewed - resources required to improve adherence to care identified - self-awareness of signs/symptoms of hypo or hyperglycemia encouraged - use of blood glucose monitoring log promoted        Patient Care Plan: RNCM: Hypertension (Adult)     Problem Identified: RNCM: Hypertension (Hypertension)   Priority: Medium     Long-Range Goal: RNCM: Hypertension Monitored   Priority: Medium  Note:   Objective:  Last practice recorded BP readings:  BP Readings from Last 3 Encounters:  03/20/21 (!) 136/42  12/25/20 (!) 142/78  10/08/20 (!) 132/40   Most recent eGFR/CrCl:  Lab Results  Component Value Date   EGFR 30 (L) 12/25/2020    No components found for: CRCL Current Barriers:  Knowledge Deficits related to basic understanding of hypertension pathophysiology and self care management Knowledge Deficits related to understanding of medications prescribed for management of hypertension Unable to independently manage HTN Does not contact provider office for questions/concerns Case Manager Clinical Goal(s):  patient will verbalize  understanding of plan for hypertension management patient will demonstrate improved adherence to prescribed treatment plan for hypertension as evidenced by taking all medications as prescribed, monitoring and recording blood pressure as directed, adhering to low sodium/DASH diet patient will demonstrate improved health management independence as evidenced by checking blood pressure as directed and notifying PCP if SBP>160 or DBP > 90, taking all medications as prescribe, and adhering to a low sodium diet as discussed. patient will verbalize basic understanding of hypertension disease process and self health management plan as evidenced by compliance with medications, compliance with heart healthy/ADA diet and working with the CCM team to optimize health and well being.  Interventions:  Collaboration with [name], MD regarding development and update of comprehensive plan of care as evidenced by provider attestation and co-signature Inter-disciplinary care team collaboration (see longitudinal plan of care) Evaluation of current treatment plan related to hypertension self management and patient's adherence to plan as established by provider. Provided education to patient re: stroke prevention, s/s of heart attack and stroke, DASH diet, complications of uncontrolled blood pressure Reviewed medications with patient and discussed importance of compliance Discussed plans with patient for ongoing care management follow up and provided patient with direct contact information for care management team Advised patient, providing education and rationale, to monitor blood pressure daily and record, calling PCP for findings outside established parameters.  Self-Care Activities: - Self administers medications as prescribed Attends all scheduled provider appointments Calls provider office for new concerns, questions, or BP outside discussed parameters Checks BP and records as discussed Follows a low sodium  diet/DASH diet Patient Goals: - check blood pressure weekly - choose a place to take my blood pressure (home, clinic or office, retail store) - write blood pressure results in a log or diary - agree on reward when goals are met - agree to work together to make changes - ask questions to understand - have a family meeting to talk about healthy habits - learn about high blood pressure - blood pressure trends reviewed - depression screen reviewed - home or ambulatory blood pressure monitoring encouraged Follow Up Plan: Telephone follow up appointment with care management team member scheduled for: next quarter     Task: RNCM: Identify and Monitor Blood Pressure  Elevation   Note:   Care Management Activities:    - blood pressure trends reviewed - depression screen reviewed - home or ambulatory blood pressure monitoring encouraged          Patient verbalizes understanding of instructions provided today and agrees to view in [place].   The care management team will reach out to the patient again over the next 60 to 90 days.   Noreene Larsson RN, MSN, [facility] Mobile: [phone]

## 2021-04-21 ENCOUNTER — Ambulatory Visit (INDEPENDENT_AMBULATORY_CARE_PROVIDER_SITE_OTHER): Payer: Medicare Other | Admitting: Internal Medicine

## 2021-04-21 ENCOUNTER — Other Ambulatory Visit: Payer: Self-pay

## 2021-04-21 ENCOUNTER — Encounter: Payer: Self-pay | Admitting: Internal Medicine

## 2021-04-21 VITALS — BP 124/40 | HR 96 | Temp 98.3°F | Ht 60.0 in | Wt 123.0 lb

## 2021-04-21 DIAGNOSIS — I10 Essential (primary) hypertension: Secondary | ICD-10-CM | POA: Diagnosis not present

## 2021-04-21 DIAGNOSIS — I5032 Chronic diastolic (congestive) heart failure: Secondary | ICD-10-CM | POA: Diagnosis not present

## 2021-04-21 DIAGNOSIS — R600 Localized edema: Secondary | ICD-10-CM | POA: Diagnosis not present

## 2021-04-21 NOTE — Patient Instructions (Signed)
Increase torsemide to 20 mg in the AM for three days; continue the 10 mg in the evening.  After three days, resume 10 mg twice a day.  You can continue to monitor your weight and do this regimen once your weight is up 2 lbs.

## 2021-04-21 NOTE — Progress Notes (Signed)
Date:  04/21/2021   Name:  Jill Hudson   DOB:  16-Jan-1935   MRN:  [id number]   Chief Complaint: Edema (Gaining weight due to fluid, Tried calling cardiology and they told her to see her PCP. 2-3 pounds up and down. Edema in ankles, feet, and face. )  HPI Edema - over the past few weeks she has been having more LE edema as well as some facial puffiness.  Overall up 3 lbs per home scales.  She does not elevate during the day - spends most of the day sitting outside.  She sleeps in bed and the edema is better in the AM.  Currently taking torsemide 10 mg bid.  No chest pain, perhaps slight increase in DOE.  She has chronic HF and CKD.  Lab Results  Component Value Date   CREATININE 1.65 (H) 12/25/2020   BUN 35 (H) 12/25/2020   NA 132 (L) 12/25/2020   K 4.9 12/25/2020   CL 96 12/25/2020   CO2 20 12/25/2020   Lab Results  Component Value Date   CHOL 164 02/10/2021   HDL 48 02/10/2021   LDLCALC 88 02/10/2021   TRIG 137 02/10/2021   CHOLHDL 2.1 02/19/2020   Lab Results  Component Value Date   TSH 3.720 12/25/2020   Lab Results  Component Value Date   HGBA1C 8.8 02/10/2021   Lab Results  Component Value Date   WBC 8.3 12/25/2020   HGB 10.7 (L) 12/25/2020   HCT 33.3 (L) 12/25/2020   MCV 92 12/25/2020   PLT 251 12/25/2020   Lab Results  Component Value Date   ALT 25 12/25/2020   AST 23 12/25/2020   ALKPHOS 107 12/25/2020   BILITOT <0.2 12/25/2020     Review of Systems  Constitutional:  Negative for chills, diaphoresis, fatigue and fever.  Respiratory:  Negative for cough, chest tightness and wheezing.   Cardiovascular:  Positive for leg swelling. Negative for chest pain and palpitations.  Musculoskeletal:  Positive for arthralgias and gait problem (uses a cane).  Neurological:  Positive for light-headedness (occasionally in the early AM). Negative for dizziness and headaches.  Psychiatric/Behavioral:  Negative for dysphoric mood and sleep disturbance. The patient is  not nervous/anxious.    Patient Active Problem List   Diagnosis Date Noted   Acquired thrombophilia ([place]) 03/20/2021   Mood disorder ([place]) 05/02/2020   Chronic heart failure with preserved ejection fraction ([place]) 04/12/2020   Diverticulosis of large intestine without perforation or abscess with bleeding 03/27/2020   AF (paroxysmal atrial fibrillation) ([place])    Thrush    COPD (chronic obstructive pulmonary disease) with chronic bronchitis ([place])    CKD (chronic kidney disease), stage IIIa 02/18/2020   Malnutrition of mild degree ([place]) 01/08/2020   Age-related macular degeneration, dry, left eye 06/21/2019   Age-related macular degeneration, wet, right eye ([place]) 06/21/2019   Moderate nonproliferative diabetic retinopathy associated with type 2 diabetes mellitus ([place]) 06/21/2019   Lumbosacral radiculopathy at L4 05/01/2019   Underweight 05/01/2019   Encounter for long-term (current) use of aspirin 10/31/2018   Encounter for long-term (current) use of antiplatelets/antithrombotics 10/31/2018   Long term current use of oral hypoglycemic drug 10/31/2018   Encounter for long-term (current) use of insulin ([place]) 10/31/2018   GIB (gastrointestinal bleeding) 08/11/2018   Leg pain 07/03/2017   Carpal tunnel syndrome on both sides 05/05/2017   Myalgia due to HMG CoA reductase inhibitor 05/05/2017   History of CVA (cerebrovascular accident) 02/15/2017   Degenerative  disc disease, lumbar 12/21/2016   Atherosclerosis of native arteries of extremity with intermittent claudication ([place]) 12/20/2016   Bilateral carotid artery stenosis 12/20/2016   Occlusion and stenosis of bilateral carotid arteries 12/20/2016   Hip bursitis 05/18/2016   Elevated TSH 01/18/2016   CKD stage 3 due to type 2 diabetes mellitus ([place]) 01/16/2016   Senile ecchymosis 01/16/2016   Type II diabetes mellitus with renal manifestations ([place]) 09/16/2015   GERD (gastroesophageal reflux disease) 07/24/2015   PVD (peripheral vascular  disease) ([place]) 05/17/2015   Neoplasm of uncertain behavior of skin 05/17/2015   Retinopathy, diabetic, proliferative ([place]) 04/11/2015   Hyperlipidemia associated with type 2 diabetes mellitus ([place]) 04/11/2015   Essential hypertension 04/11/2015   Generalized OA 04/11/2015   Proliferative diabetic retinopathy(362.02) 04/11/2015   Arteriosclerosis of coronary artery 05/29/2013   Hypertensive heart disease without CHF 05/29/2013    Allergies  Allergen Reactions   Saxagliptin Diarrhea   Epinephrine Other (See Comments)    Patient does not remember what happens when she uses this   Atorvastatin Other (See Comments)    Muscle aches   Codeine Other (See Comments)    Upset stomach   Ezetimibe Other (See Comments)    Myalgias(ZETIA)   Limonene Rash    Patient does not recall this reaction   Nitrofurantoin Rash and Other (See Comments)    Pruitus   Sulfa Antibiotics Rash and Other (See Comments)    Sore mouth     Past Surgical History:  Procedure Laterality Date   CARDIAC CATHETERIZATION  1998   40% LM, 95% Ramus interm   CATARACT EXTRACTION, BILATERAL     COLONOSCOPY WITH PROPOFOL N/A 08/12/2018   Procedure: COLONOSCOPY WITH PROPOFOL;  Surgeon: Lucilla Lame, MD;  Location: [facility] ENDOSCOPY;  Service: Endoscopy;  Laterality: N/A;   ENDARTERECTOMY FEMORAL Left 10/12/2018   Procedure: ENDARTERECTOMY FEMORAL;  Surgeon: Katha Cabal, MD;  Location: [facility] ORS;  Service: Vascular;  Laterality: Left;  angioplasty and left SFA stent placement   EYE SURGERY Bilateral    cataract extractions   LOWER EXTREMITY ANGIOGRAPHY Left 08/23/2017   Procedure: Lower Extremity Angiography;  Surgeon: Algernon Huxley, MD;  Location: [place] CV LAB;  Service: Cardiovascular;  Laterality: Left;   LOWER EXTREMITY ANGIOGRAPHY Left 07/26/2018   Procedure: LOWER EXTREMITY ANGIOGRAPHY;  Surgeon: Katha Cabal, MD;  Location: [place] CV [facility];  Service: Cardiovascular;  Laterality: Left;   LOWER  EXTREMITY ANGIOGRAPHY Left 09/16/2018   Procedure: LOWER EXTREMITY ANGIOGRAPHY;  Surgeon: Katha Cabal, MD;  Location: [place] CV LAB;  Service: Cardiovascular;  Laterality: Left;   PTCA  08/2013   Left common iliac   PTCA  12/2012   left ext iliac   RIGHT HEART CATH N/A 02/23/2020   Procedure: RIGHT HEART CATH;  Surgeon: Minna Merritts, MD;  Location: [place] [facility];  Service: Cardiovascular;  Laterality: N/A;   TUBAL LIGATION      Social History   Tobacco Use   Smoking status: Former    Packs/day: 2.00    Years: 37.00    Pack years: 74.00    Types: Cigarettes    Quit date: 1980    Years since quitting: 42.5   Smokeless tobacco: Never   Tobacco comments:    smoking cessation materials not required  Vaping Use   Vaping Use: Never used  Substance Use Topics   Alcohol use: No    Alcohol/week: 0.0 standard drinks   Drug use: No  Medication list has been reviewed and updated.  Current Meds  Medication Sig   acetaminophen (TYLENOL) 650 MG CR tablet Take 1,300 mg by mouth every 8 (eight) hours as needed for pain.   albuterol (VENTOLIN HFA) 108 (90 Base) MCG/ACT inhaler Inhale 2 puffs into the lungs every 6 (six) hours as needed for wheezing or shortness of breath.   amLODipine (NORVASC) 5 MG tablet Take 5 mg by mouth in the morning and at bedtime.   B-D ULTRAFINE III SHORT PEN 31G X 8 MM MISC USE AS DIRECTED   Cyanocobalamin 1000 MCG TBCR Take 1,000 mcg by mouth daily.   ELIQUIS 2.5 MG TABS tablet TAKE 1 TABLET(2.5 MG) BY MOUTH TWICE DAILY   feeding supplement, GLUCERNA SHAKE, (GLUCERNA SHAKE) LIQD Take 237 mLs by mouth 3 (three) times daily between meals.   ferrous sulfate 325 (65 FE) MG tablet Take 325 mg by mouth daily with breakfast.   gabapentin (NEURONTIN) 100 MG capsule Take 1 capsule (100 mg total) by mouth at bedtime.   insulin aspart (NOVOLOG FLEXPEN) 100 UNIT/ML FlexPen Inject 3 Units into the skin 3 (three) times daily with meals. At lunch  (Patient taking differently: Inject 20 Units into the skin daily. At breakfast- sliding scale)   Insulin Glargine (BASAGLAR KWIKPEN) 100 UNIT/ML INJECT 12 UNITS UNDER THE SKIN DAILY   losartan (COZAAR) 25 MG tablet Take 25 mg by mouth 2 (two) times daily. Unsure of dosage   melatonin 5 MG TABS Take 0.5 tablets (2.5 mg total) by mouth at bedtime as needed (sleep).   Multiple Vitamins-Minerals (PRESERVISION AREDS PO) Take 1 capsule by mouth 2 (two) times daily.    promethazine-dextromethorphan (PROMETHAZINE-DM) 6.25-15 MG/5ML syrup TAKE 5 ML BY MOUTH FOUR TIMES DAILY AS NEEDED FOR COUGH   rosuvastatin (CRESTOR) 5 MG tablet Take 5 mg by mouth every other day. Mon, Wed, Fri and Sat.   torsemide (DEMADEX) 20 MG tablet Take 2 tablets (40 mg total) by mouth daily. (Patient taking differently: Take 40 mg by mouth 2 (two) times daily. 1/2 tablet morning and 1/2 tablet at night.)    PHQ 2/9 Scores 04/21/2021 03/20/2021 12/25/2020 10/08/2020  PHQ - 2 Score 3 1 0 3  PHQ- 9 Score 16 5 4 9     GAD 7 : Generalized Anxiety Score 04/21/2021 03/20/2021 12/25/2020 10/08/2020  Nervous, Anxious, on Edge 0 1 0 2  Control/stop worrying 1 0 0 2  Worry too much - different things 0 0 0 2  Trouble relaxing 0 0 0 0  Restless 0 0 0 0  Easily annoyed or irritable 0 0 0 0  Afraid - awful might happen 0 0 0 0  Total GAD 7 Score 1 1 0 6  Anxiety Difficulty - - - -    BP Readings from Last 3 Encounters:  04/21/21 (!) 124/40  03/20/21 (!) 136/42  12/25/20 (!) 142/78    Physical Exam Constitutional:      Appearance: Normal appearance.  Cardiovascular:     Rate and Rhythm: Normal rate and regular rhythm.     Pulses: Normal pulses.          Radial pulses are 2+ on the right side and 2+ on the left side.     Heart sounds: Heart sounds are distant.     Comments: Non pitting edema Pulmonary:     Effort: Pulmonary effort is normal.     Breath sounds: No wheezing or rhonchi.  Musculoskeletal:     Cervical back: Normal  range of motion.     Right lower leg: Edema present.     Left lower leg: Edema present.  Lymphadenopathy:     Cervical: No cervical adenopathy.  Neurological:     Mental Status: She is alert.    Wt Readings from Last 3 Encounters:  04/21/21 123 lb (55.8 kg)  03/20/21 120 lb (54.4 kg)  12/25/20 121 lb (54.9 kg)    BP (!) 124/40   Pulse 96   Temp 98.3 F (36.8 C) (Oral)   Ht 5' (1.524 m)   Wt 123 lb (55.8 kg)   SpO2 98%   BMI 24.02 kg/m   Assessment and Plan: 1. Localized edema Adjust torsemide up to 20 mg in AM x 3 days then resume regular schedule. Repeat this pattern whenever weight is up 3 lbs but not more often than every 2 weeks. If worsening, see cardiology.  2. Essential hypertension Clinically stable exam with well controlled BP. Tolerating medications without side effects at this time. Pt to continue current regimen and low sodium diet; benefits of regular exercise as able discussed.  3. Chronic heart failure with preserved ejection fraction ([place]) Follow up with Cardiology in July.   Partially dictated using Editor, commissioning. Any errors are unintentional.  [name], MD [facility]  04/21/2021

## 2021-04-22 DIAGNOSIS — H353221 Exudative age-related macular degeneration, left eye, with active choroidal neovascularization: Secondary | ICD-10-CM | POA: Diagnosis not present

## 2021-04-22 DIAGNOSIS — H353211 Exudative age-related macular degeneration, right eye, with active choroidal neovascularization: Secondary | ICD-10-CM | POA: Diagnosis not present

## 2021-04-29 DIAGNOSIS — E118 Type 2 diabetes mellitus with unspecified complications: Secondary | ICD-10-CM | POA: Diagnosis not present

## 2021-04-29 DIAGNOSIS — Z794 Long term (current) use of insulin: Secondary | ICD-10-CM | POA: Diagnosis not present

## 2021-05-01 ENCOUNTER — Emergency Department: Payer: Medicare Other

## 2021-05-01 ENCOUNTER — Encounter: Payer: Self-pay | Admitting: Emergency Medicine

## 2021-05-01 DIAGNOSIS — M25562 Pain in left knee: Secondary | ICD-10-CM | POA: Diagnosis not present

## 2021-05-01 DIAGNOSIS — Z79899 Other long term (current) drug therapy: Secondary | ICD-10-CM | POA: Diagnosis not present

## 2021-05-01 DIAGNOSIS — N1831 Chronic kidney disease, stage 3a: Secondary | ICD-10-CM | POA: Diagnosis not present

## 2021-05-01 DIAGNOSIS — Z87891 Personal history of nicotine dependence: Secondary | ICD-10-CM | POA: Diagnosis not present

## 2021-05-01 DIAGNOSIS — I700 Atherosclerosis of aorta: Secondary | ICD-10-CM | POA: Diagnosis not present

## 2021-05-01 DIAGNOSIS — Z23 Encounter for immunization: Secondary | ICD-10-CM | POA: Diagnosis not present

## 2021-05-01 DIAGNOSIS — I130 Hypertensive heart and chronic kidney disease with heart failure and stage 1 through stage 4 chronic kidney disease, or unspecified chronic kidney disease: Secondary | ICD-10-CM | POA: Insufficient documentation

## 2021-05-01 DIAGNOSIS — S51812A Laceration without foreign body of left forearm, initial encounter: Secondary | ICD-10-CM | POA: Diagnosis not present

## 2021-05-01 DIAGNOSIS — E1122 Type 2 diabetes mellitus with diabetic chronic kidney disease: Secondary | ICD-10-CM | POA: Insufficient documentation

## 2021-05-01 DIAGNOSIS — Z794 Long term (current) use of insulin: Secondary | ICD-10-CM | POA: Insufficient documentation

## 2021-05-01 DIAGNOSIS — R0781 Pleurodynia: Secondary | ICD-10-CM | POA: Diagnosis not present

## 2021-05-01 DIAGNOSIS — W19XXXA Unspecified fall, initial encounter: Secondary | ICD-10-CM | POA: Diagnosis not present

## 2021-05-01 DIAGNOSIS — Z7901 Long term (current) use of anticoagulants: Secondary | ICD-10-CM | POA: Diagnosis not present

## 2021-05-01 DIAGNOSIS — R739 Hyperglycemia, unspecified: Secondary | ICD-10-CM | POA: Diagnosis not present

## 2021-05-01 DIAGNOSIS — Z85828 Personal history of other malignant neoplasm of skin: Secondary | ICD-10-CM | POA: Diagnosis not present

## 2021-05-01 DIAGNOSIS — I10 Essential (primary) hypertension: Secondary | ICD-10-CM | POA: Diagnosis not present

## 2021-05-01 DIAGNOSIS — Y9301 Activity, walking, marching and hiking: Secondary | ICD-10-CM | POA: Insufficient documentation

## 2021-05-01 DIAGNOSIS — S59912A Unspecified injury of left forearm, initial encounter: Secondary | ICD-10-CM | POA: Diagnosis present

## 2021-05-01 DIAGNOSIS — J449 Chronic obstructive pulmonary disease, unspecified: Secondary | ICD-10-CM | POA: Insufficient documentation

## 2021-05-01 DIAGNOSIS — S81012A Laceration without foreign body, left knee, initial encounter: Secondary | ICD-10-CM | POA: Insufficient documentation

## 2021-05-01 DIAGNOSIS — E113599 Type 2 diabetes mellitus with proliferative diabetic retinopathy without macular edema, unspecified eye: Secondary | ICD-10-CM | POA: Diagnosis not present

## 2021-05-01 DIAGNOSIS — Z955 Presence of coronary angioplasty implant and graft: Secondary | ICD-10-CM | POA: Diagnosis not present

## 2021-05-01 DIAGNOSIS — Z743 Need for continuous supervision: Secondary | ICD-10-CM | POA: Diagnosis not present

## 2021-05-01 DIAGNOSIS — E113399 Type 2 diabetes mellitus with moderate nonproliferative diabetic retinopathy without macular edema, unspecified eye: Secondary | ICD-10-CM | POA: Insufficient documentation

## 2021-05-01 DIAGNOSIS — I5031 Acute diastolic (congestive) heart failure: Secondary | ICD-10-CM | POA: Diagnosis not present

## 2021-05-01 DIAGNOSIS — R6889 Other general symptoms and signs: Secondary | ICD-10-CM | POA: Diagnosis not present

## 2021-05-01 DIAGNOSIS — W010XXA Fall on same level from slipping, tripping and stumbling without subsequent striking against object, initial encounter: Secondary | ICD-10-CM | POA: Diagnosis not present

## 2021-05-01 DIAGNOSIS — S299XXA Unspecified injury of thorax, initial encounter: Secondary | ICD-10-CM | POA: Diagnosis not present

## 2021-05-01 NOTE — ED Triage Notes (Signed)
Pt arrived via [facility] from home post fall after slipping on rug and hitting left lower rib-cage, left forearm and left knee on wooden chair/hardwood floor. Pt denies LOC as well as hitting head. Skin tear to the left forearm and left knee with bruising and redness noted. No obvious deformity noted.

## 2021-05-01 NOTE — ED Triage Notes (Signed)
EMS brings pt in from home; tripped on rug and fell; c/o left knee pain and left rib pain

## 2021-05-02 ENCOUNTER — Emergency Department
Admission: EM | Admit: 2021-05-02 | Discharge: 2021-05-02 | Disposition: A | Discharge status: Home / Self Care | Payer: Medicare Other | Attending: Emergency Medicine | Admitting: Emergency Medicine

## 2021-05-02 DIAGNOSIS — T148XXA Other injury of unspecified body region, initial encounter: Secondary | ICD-10-CM

## 2021-05-02 DIAGNOSIS — R0781 Pleurodynia: Secondary | ICD-10-CM | POA: Diagnosis not present

## 2021-05-02 DIAGNOSIS — W19XXXA Unspecified fall, initial encounter: Secondary | ICD-10-CM

## 2021-05-02 DIAGNOSIS — M25562 Pain in left knee: Secondary | ICD-10-CM | POA: Diagnosis not present

## 2021-05-02 MED ORDER — TETANUS-DIPHTH-ACELL PERTUSSIS 5-2.5-18.5 LF-MCG/0.5 IM SUSY
0.5000 mL | PREFILLED_SYRINGE | Freq: Once | INTRAMUSCULAR | Status: AC
  Administered 2021-05-02: 0.5 mL via INTRAMUSCULAR
  Filled 2021-05-02: qty 0.5

## 2021-05-02 MED ORDER — ONDANSETRON 4 MG PO TBDP: 4.0000 mg | ORAL_TABLET | Freq: Four times a day (QID) | ORAL | 0 refills | Status: DC | PRN

## 2021-05-02 MED ORDER — HYDROCODONE-ACETAMINOPHEN 5-325 MG PO TABS
1.0000 | ORAL_TABLET | Freq: Once | ORAL | Status: AC
Start: 2021-05-02 — End: 2021-05-02
  Administered 2021-05-02: 1 via ORAL
  Filled 2021-05-02: qty 1

## 2021-05-02 MED ORDER — ONDANSETRON 4 MG PO TBDP
4.0000 mg | ORAL_TABLET | Freq: Once | ORAL | Status: AC
  Administered 2021-05-02: 4 mg via ORAL
  Filled 2021-05-02: qty 1

## 2021-05-02 MED ORDER — HYDROCODONE-ACETAMINOPHEN 5-325 MG PO TABS: 1.0000 | ORAL_TABLET | Freq: Four times a day (QID) | ORAL | 0 refills | Status: DC | PRN

## 2021-05-02 NOTE — Discharge Instructions (Addendum)

## 2021-05-02 NOTE — ED Notes (Signed)
Pt is resting with eyes closed awaiting her daughter to pick her up. Call light given to pt. Denies any needs at this time.

## 2021-05-02 NOTE — ED Notes (Signed)
Pts daughter called and will be to ED at 0745 to pick pt up.

## 2021-05-02 NOTE — ED Notes (Addendum)
Pts left forearm and left knee redressed and pt ambulated around room with walker. Pt c/o pain but able to move with assistance at slow steady rate.

## 2021-05-02 NOTE — ED Provider Notes (Signed)
[facility] Emergency Department Provider Note ____________________________________________   Event Date/Time   First MD Initiated Contact with Patient 05/02/21 0327     (approximate)  I have reviewed the triage vital signs and the nursing notes.   HISTORY  Chief Complaint Fall    HPI Jill Hudson is a 86 y.o. female with history of CHF, hypertension, diabetes, hyperlipidemia, chronic kidney disease, TIA, PVD who presents to the emergency department after her left foot slipped out from underneath her walking on hardwood floors and she fell onto her left side.  Complaining of left knee pain, left forearm pain and left rib pain.  Did not hit her head or lose consciousness.  No neck or back pain.  No numbness, tingling or weakness.  Has a skin tear to the left forearm and left anterior knee.  Unsure of her last tetanus vaccination.  States she normally ambulates with a cane.  States she was brought into the emergency department by EMS.         Past Medical History:  Diagnosis Date   Acute CHF (congestive heart failure) ([place]) 0/86/5784   Acute diastolic CHF (congestive heart failure) (HCC)    Acute kidney injury superimposed on CKD (HCC)    Allergies    Anxiety    Arthritis    spine and shoulder   Atherosclerosis of artery of extremity with rest pain ([place]) 10/12/2018   Cancer (HCC)    skin   CHF (congestive heart failure) ([place])    Diabetes mellitus without complication ([place])    Diverticulosis of large intestine with hemorrhage    GI bleeding 12/25/2019   Heart murmur    Hyperlipidemia    Hypertension    Macula lutea degeneration    Mitral and aortic valve disease    Myocardial infarction ([place])    may have had a "light" heart attack   Non-ST elevation (NSTEMI) myocardial infarction (HCC)    Occasional tremors    PAD (peripheral artery disease) (HCC)    Rectal bleeding    Shingles    patient unaware but daughter confirms. it was a long time ago    Stroke [facility]) 01/2017   may have had a slight stroke   TIA (transient ischemic attack) 01/2017   UTI (urinary tract infection)    Vascular disease, peripheral ([place])     Patient Active Problem List   Diagnosis Date Noted   Acquired thrombophilia ([place]) 03/20/2021   Mood disorder ([place]) 05/02/2020   Chronic heart failure with preserved ejection fraction ([place]) 04/12/2020   Diverticulosis of large intestine without perforation or abscess with bleeding 03/27/2020   AF (paroxysmal atrial fibrillation) ([place])    Thrush    COPD (chronic obstructive pulmonary disease) with chronic bronchitis ([place])    CKD (chronic kidney disease), stage IIIa 02/18/2020   Malnutrition of mild degree ([place]) 01/08/2020   Age-related macular degeneration, dry, left eye 06/21/2019   Age-related macular degeneration, wet, right eye ([place]) 06/21/2019   Moderate nonproliferative diabetic retinopathy associated with type 2 diabetes mellitus ([place]) 06/21/2019   Lumbosacral radiculopathy at L4 05/01/2019   Underweight 05/01/2019   Encounter for long-term (current) use of aspirin 10/31/2018   Encounter for long-term (current) use of antiplatelets/antithrombotics 10/31/2018   Long term current use of oral hypoglycemic drug 10/31/2018   Encounter for long-term (current) use of insulin ([place]) 10/31/2018   GIB (gastrointestinal bleeding) 08/11/2018   Leg pain 07/03/2017   Carpal tunnel syndrome on both sides 05/05/2017   Myalgia  due to HMG CoA reductase inhibitor 05/05/2017   History of CVA (cerebrovascular accident) 02/15/2017   Degenerative disc disease, lumbar 12/21/2016   Atherosclerosis of native arteries of extremity with intermittent claudication ([place]) 12/20/2016   Bilateral carotid artery stenosis 12/20/2016   Occlusion and stenosis of bilateral carotid arteries 12/20/2016   Hip bursitis 05/18/2016   Elevated TSH 01/18/2016   CKD stage 3 due to type 2 diabetes mellitus ([place]) 01/16/2016   Senile ecchymosis 01/16/2016    Type II diabetes mellitus with renal manifestations ([place]) 09/16/2015   GERD (gastroesophageal reflux disease) 07/24/2015   PVD (peripheral vascular disease) ([place]) 05/17/2015   Neoplasm of uncertain behavior of skin 05/17/2015   Retinopathy, diabetic, proliferative ([place]) 04/11/2015   Hyperlipidemia associated with type 2 diabetes mellitus ([place]) 04/11/2015   Essential hypertension 04/11/2015   Generalized OA 04/11/2015   Proliferative diabetic retinopathy(362.02) 04/11/2015   Arteriosclerosis of coronary artery 05/29/2013   Hypertensive heart disease without CHF 05/29/2013    Past Surgical History:  Procedure Laterality Date   CARDIAC CATHETERIZATION  1998   40% LM, 95% Ramus interm   CATARACT EXTRACTION, BILATERAL     COLONOSCOPY WITH PROPOFOL N/A 08/12/2018   Procedure: COLONOSCOPY WITH PROPOFOL;  Surgeon: Lucilla Lame, MD;  Location: [facility] ENDOSCOPY;  Service: Endoscopy;  Laterality: N/A;   ENDARTERECTOMY FEMORAL Left 10/12/2018   Procedure: ENDARTERECTOMY FEMORAL;  Surgeon: Katha Cabal, MD;  Location: [facility] ORS;  Service: Vascular;  Laterality: Left;  angioplasty and left SFA stent placement   EYE SURGERY Bilateral    cataract extractions   LOWER EXTREMITY ANGIOGRAPHY Left 08/23/2017   Procedure: Lower Extremity Angiography;  Surgeon: Algernon Huxley, MD;  Location: [place] CV LAB;  Service: Cardiovascular;  Laterality: Left;   LOWER EXTREMITY ANGIOGRAPHY Left 07/26/2018   Procedure: LOWER EXTREMITY ANGIOGRAPHY;  Surgeon: Katha Cabal, MD;  Location: [place] CV LAB;  Service: Cardiovascular;  Laterality: Left;   LOWER EXTREMITY ANGIOGRAPHY Left 09/16/2018   Procedure: LOWER EXTREMITY ANGIOGRAPHY;  Surgeon: Katha Cabal, MD;  Location: [place] CV LAB;  Service: Cardiovascular;  Laterality: Left;   PTCA  08/2013   Left common iliac   PTCA  12/2012   left ext iliac   RIGHT HEART CATH N/A 02/23/2020   Procedure: RIGHT HEART CATH;  Surgeon: Minna Merritts, MD;  Location: [place] CV LAB;  Service: Cardiovascular;  Laterality: N/A;   TUBAL LIGATION      Prior to Admission medications   Medication Sig Start Date End Date Taking? Authorizing Provider  HYDROcodone-acetaminophen (NORCO/VICODIN) 5-325 MG tablet Take 1 tablet by mouth every 6 (six) hours as needed. 05/02/21  Yes Micahel Omlor N, DO  ondansetron (ZOFRAN ODT) 4 MG disintegrating tablet Take 1 tablet (4 mg total) by mouth every 6 (six) hours as needed for nausea or vomiting. 05/02/21  Yes Robinette Esters, Delice Bison, DO  acetaminophen (TYLENOL) 650 MG CR tablet Take 1,300 mg by mouth every 8 (eight) hours as needed for pain.    [provider]  albuterol (VENTOLIN HFA) 108 (90 Base) MCG/ACT inhaler Inhale 2 puffs into the lungs every 6 (six) hours as needed for wheezing or shortness of breath. 02/26/20   Loletha Grayer, MD  amLODipine (NORVASC) 5 MG tablet Take 5 mg by mouth in the morning and at bedtime. 09/03/20   [provider]  B-D ULTRAFINE III SHORT PEN 31G X 8 MM MISC USE AS DIRECTED 03/26/20   [name], MD  cloNIDine (CATAPRES) 0.2  MG tablet Take 1 tablet (0.2 mg total) by mouth 2 (two) times daily. Please check blood pressure prior to taking medication.  Only take if blood pressure is greater than 850 systolic. Patient taking differently: Take 0.2 mg by mouth as needed. Please check blood pressure prior to taking medication.  Only take if blood pressure is greater than 277 systolic. 07/20/20 08/19/20  Marlana Salvage, PA  Cyanocobalamin 1000 MCG TBCR Take 1,000 mcg by mouth daily.    [provider]  ELIQUIS 2.5 MG TABS tablet TAKE 1 TABLET(2.5 MG) BY MOUTH TWICE DAILY 04/15/21   [name], MD  feeding supplement, GLUCERNA SHAKE, (GLUCERNA SHAKE) LIQD Take 237 mLs by mouth 3 (three) times daily between meals. 12/28/19   Loletha Grayer, MD  ferrous sulfate 325 (65 FE) MG tablet Take 325 mg by mouth daily with breakfast.    [provider]  gabapentin (NEURONTIN) 100 MG capsule Take 1 capsule (100 mg total) by mouth at bedtime. 12/25/20   [name], MD  insulin aspart (NOVOLOG FLEXPEN) 100 UNIT/ML FlexPen Inject 3 Units into the skin 3 (three) times daily with meals. At lunch Patient taking differently: Inject 20 Units into the skin daily. At breakfast- sliding scale 01/18/19   [name], MD  Insulin Glargine [facility]) 100 UNIT/ML INJECT 12 UNITS UNDER THE SKIN DAILY 09/22/20   [name], MD  losartan (COZAAR) 25 MG tablet Take 25 mg by mouth 2 (two) times daily. Unsure of dosage    [provider]  melatonin 5 MG TABS Take 0.5 tablets (2.5 mg total) by mouth at bedtime as needed (sleep). 02/26/20   Loletha Grayer, MD  Multiple Vitamins-Minerals (PRESERVISION AREDS PO) Take 1 capsule by mouth 2 (two) times daily.     [provider]  promethazine-dextromethorphan (PROMETHAZINE-DM) 6.25-15 MG/5ML syrup TAKE 5 ML BY MOUTH FOUR TIMES DAILY AS NEEDED FOR COUGH 03/20/21   [name], MD  rosuvastatin (CRESTOR) 5 MG tablet Take 5 mg by mouth every other day. Mon, Wed, Fri and Sat. 03/02/18   [provider]  torsemide (DEMADEX) 20 MG tablet Take 2 tablets (40 mg total) by mouth daily. Patient taking differently: Take 40 mg by mouth 2 (two) times daily. 1/2 tablet morning and 1/2 tablet at night. 02/27/20   Loletha Grayer, MD    Allergies Saxagliptin, Epinephrine, Atorvastatin, Codeine, Ezetimibe, Limonene, Nitrofurantoin, and Sulfa antibiotics  Family History  Problem Relation Age of Onset   Dementia Mother    Diabetes Father     Social History Social History   Tobacco Use   Smoking status: Former    Packs/day: 2.00    Years: 37.00    Pack years: 74.00    Types: Cigarettes    Quit date: 1980    Years since quitting: 42.5   Smokeless tobacco: Never   Tobacco comments:    smoking cessation materials not required  Vaping Use   Vaping Use: Never used   Substance Use Topics   Alcohol use: No    Alcohol/week: 0.0 standard drinks   Drug use: No    Review of Systems Constitutional: No fever. Eyes: No visual changes. ENT: No sore throat. Cardiovascular: Denies chest pain. Respiratory: Denies shortness of breath. Gastrointestinal: No nausea, vomiting, diarrhea. Genitourinary: Negative for dysuria. Musculoskeletal: Negative for back pain. Skin: Negative for rash. Neurological: Negative for focal weakness or numbness.   ____________________________________________   PHYSICAL EXAM:  VITAL SIGNS: ED Triage Vitals  Enc  Vitals Group     BP 05/01/21 2246 (!) 166/44     Pulse Rate 05/01/21 2246 70     Resp 05/01/21 2246 17     Temp 05/01/21 2246 98.1 F (36.7 C)     Temp Source 05/01/21 2246 Oral     SpO2 05/01/21 2246 99 %     Weight 05/01/21 2247 123 lb (55.8 kg)     Height 05/01/21 2247 5' (1.524 m)     Head Circumference --      Peak Flow --      Pain Score --      Pain Loc --      Pain Edu? --      Excl. in [place]? --    CONSTITUTIONAL: Alert and oriented and responds appropriately to questions. Well-appearing; well-nourished; GCS 15 HEAD: Normocephalic; atraumatic EYES: Conjunctivae clear, PERRL, EOMI ENT: normal nose; no rhinorrhea; moist mucous membranes; pharynx without lesions noted; no dental injury; no septal hematoma NECK: Supple, no meningismus, no LAD; no midline spinal tenderness, step-off or deformity; trachea midline CARD: RRR; S1 and S2 appreciated; no murmurs, no clicks, no rubs, no gallops RESP: Normal chest excursion without splinting or tachypnea; breath sounds clear and equal bilaterally; no wheezes, no rhonchi, no rales; no hypoxia or respiratory distress CHEST:  chest wall stable, no crepitus or ecchymosis or deformity, tender over the anterior and lateral lower left ribs, no flail chest ABD/GI: Normal bowel sounds; non-distended; soft, non-tender, no rebound, no guarding; no ecchymosis or other lesions  noted PELVIS:  stable, nontender to palpation BACK:  The back appears normal and is non-tender to palpation, there is no CVA tenderness; no midline spinal tenderness, step-off or deformity EXT: Minimal tenderness over the mid left forearm without deformity other than a small skin tear.  No soft tissue swelling, ecchymosis.  Also has tenderness diffusely over the left anterior knee without joint effusion.  She has a large skin tear to this area.  Compartments soft.  Otherwise extremities nontender to palpation.  Extremities warm and well-perfused. SKIN: Normal color for age and race; warm NEURO: Moves all extremities equally, normal sensation diffusely, cranial nerves II through XII intact, normal speech PSYCH: The patient's mood and manner are appropriate. Grooming and personal hygiene are appropriate.  ____________________________________________   LABS (all labs ordered are listed, but only abnormal results are displayed)  Labs Reviewed - No data to display ____________________________________________  EKG   ____________________________________________  RADIOLOGY I, Vesta Wheeland, personally viewed and evaluated these images (plain radiographs) as part of my medical decision making, as well as reviewing the written report by the radiologist.  ED MD interpretation: No fracture, dislocations noted.  Official radiology report(s): DG Chest 2 View  Result Date: 05/01/2021 CLINICAL DATA:  Slip and fall on rug.  Struck left lower ribcage. EXAM: CHEST - 2 VIEW COMPARISON:  04/18/2020 FINDINGS: The cardiomediastinal contours are normal. Aortic atherosclerosis. Pulmonary vasculature is normal. No consolidation, pleural effusion, or pneumothorax. No acute osseous abnormalities are seen. No visualized rib fracture on this non dedicated rib exam. IMPRESSION: No acute chest findings. Electronically Signed   By: Keith Rake M.D.   On: 05/01/2021 23:32   DG Forearm Left  Result Date:  05/01/2021 CLINICAL DATA:  Fall, slipped on rug.  Skin tear to left forearm. EXAM: LEFT FOREARM - 2 VIEW COMPARISON:  None. FINDINGS: The cortical margins of the radius and ulna are intact. There is no evidence of fracture or other focal bone lesions. Wrist and elbow  alignment is maintained. There is wrist osteoarthritis. A dressing overlies the mid and distal forearm. Vascular calcifications are seen IMPRESSION: 1. No fracture of the left forearm. 2. Wrist osteoarthritis. Electronically Signed   By: Keith Rake M.D.   On: 05/01/2021 23:31   DG Knee Complete 4 Views Left  Result Date: 05/01/2021 CLINICAL DATA:  Fall, slipped on rug.  Left knee pain. EXAM: LEFT KNEE - COMPLETE 4+ VIEW COMPARISON:  Radiograph 11/25/2018 FINDINGS: No evidence of fracture, dislocation, or joint effusion. No evidence of arthropathy or other focal bone abnormality. Small quadriceps tendon enthesophyte. Mild generalized soft tissue edema. Vascular stent in place. IMPRESSION: Soft tissue edema without acute osseous abnormality. Electronically Signed   By: Keith Rake M.D.   On: 05/01/2021 23:30    ____________________________________________   PROCEDURES  Procedure(s) performed (including Critical Care):  Procedures    ____________________________________________   INITIAL IMPRESSION / ASSESSMENT AND PLAN / ED COURSE  As part of my medical decision making, I reviewed the following data within the [place] notes reviewed and incorporated, Radiograph reviewed , Notes from prior ED visits, and [place] Controlled Substance Database         Patient here after mechanical fall.  X-rays of her chest, ribs, forearm, knee show no bony abnormality.  Will give Vicodin for pain control, update her tetanus vaccination and ambulate in the ED.  She denies any head injury.  Hemodynamically stable.  No focal neurologic deficits.  ED PROGRESS  Patient has been able to ambulate.  Pain is improved.   She will call her daughter in the morning to pick her up from the ED.  Will discharge with course of Vicodin for pain control.  At this time, I do not feel there is any life-threatening condition present. I have reviewed, interpreted and discussed all results (EKG, imaging, lab, urine as appropriate) and exam findings with patient/family. I have reviewed nursing notes and appropriate previous records.  I feel the patient is safe to be discharged home without further emergent workup and can continue workup as an outpatient as needed. Discussed usual and customary return precautions. Patient/family verbalize understanding and are comfortable with this plan.  Outpatient follow-up has been provided as needed. All questions have been answered.    ____________________________________________   FINAL CLINICAL IMPRESSION(S) / ED DIAGNOSES  Final diagnoses:  Fall, initial encounter  Multiple skin tears  Acute pain of left knee  Rib pain on left side     ED Discharge Orders          Ordered    HYDROcodone-acetaminophen (NORCO/VICODIN) 5-325 MG tablet  Every 6 hours PRN        05/02/21 0441    ondansetron (ZOFRAN ODT) 4 MG disintegrating tablet  Every 6 hours PRN        05/02/21 0441            *Please note:  [name] was evaluated in Emergency Department on 05/02/2021 for the symptoms described in the history of present illness. She was evaluated in the context of the global COVID-19 pandemic, which necessitated consideration that the patient might be at risk for infection with the SARS-CoV-2 virus that causes COVID-19. Institutional protocols and algorithms that pertain to the evaluation of patients at risk for COVID-19 are in a state of rapid change based on information released by regulatory bodies including the CDC and federal and state organizations. These policies and algorithms were followed during the patient's care in the ED.  Some ED evaluations and interventions may be delayed as  a result of limited staffing during and the pandemic.*   Note:  This document was prepared using Dragon voice recognition software and may include unintentional dictation errors.    Danyah Guastella, Delice Bison, DO 05/02/21 [phone]

## 2021-05-02 NOTE — ED Notes (Signed)
Pt able to stand and ambulate few steps to commode without difficulty to void, qs; assisted back to recliner

## 2021-05-05 ENCOUNTER — Ambulatory Visit: Payer: Self-pay | Admitting: *Deleted

## 2021-05-05 DIAGNOSIS — E1142 Type 2 diabetes mellitus with diabetic polyneuropathy: Secondary | ICD-10-CM | POA: Diagnosis not present

## 2021-05-05 DIAGNOSIS — Z794 Long term (current) use of insulin: Secondary | ICD-10-CM | POA: Diagnosis not present

## 2021-05-05 DIAGNOSIS — B351 Tinea unguium: Secondary | ICD-10-CM | POA: Diagnosis not present

## 2021-05-05 NOTE — Telephone Encounter (Signed)
Second attempt to reach daughter- left message to call office.

## 2021-05-05 NOTE — Telephone Encounter (Signed)
Pts daughter is calling to ask what can help to remove a bandage on the pt knee  Called patient's daughter to review advise. Patient's daughter reports she was able to find someone to get bandage removed from patient's knee and new bandage applied. No triage needed. Reviewed with patient's daughter if any further questions or advise needed to call back . Patient's daughter verbalized understanding .

## 2021-05-05 NOTE — Telephone Encounter (Signed)
Pts daughter is calling to ask what can help to remove a bandage on the pt knee.   Attempted to call daughter- left message to call back on VM.

## 2021-05-06 NOTE — Telephone Encounter (Signed)
Noted  KP 

## 2021-05-09 ENCOUNTER — Ambulatory Visit (INDEPENDENT_AMBULATORY_CARE_PROVIDER_SITE_OTHER): Payer: Medicare Other | Admitting: Internal Medicine

## 2021-05-09 ENCOUNTER — Other Ambulatory Visit: Payer: Self-pay

## 2021-05-09 ENCOUNTER — Encounter: Payer: Self-pay | Admitting: Internal Medicine

## 2021-05-09 VITALS — BP 140/72 | HR 69 | Temp 97.8°F | Ht 60.0 in | Wt 120.0 lb

## 2021-05-09 DIAGNOSIS — L03116 Cellulitis of left lower limb: Secondary | ICD-10-CM

## 2021-05-09 DIAGNOSIS — S81002A Unspecified open wound, left knee, initial encounter: Secondary | ICD-10-CM

## 2021-05-09 MED ORDER — AMOXICILLIN-POT CLAVULANATE 875-125 MG PO TABS: 1.0000 | ORAL_TABLET | Freq: Two times a day (BID) | ORAL | 0 refills | Status: AC

## 2021-05-09 NOTE — Progress Notes (Signed)
Date:  05/09/2021   Name:  Jill Hudson   DOB:  27-Apr-1935   MRN:  [id number]   Chief Complaint: Wound Infection (X1 week ago, Left knee from fall on hard wood floor) ER checked her out for fractures and there were none.  She had a skin tear on her knee that was covered.  Her daughter did not look at the wound for several days and noticed that it is draining and appears infected.  HPI  Lab Results  Component Value Date   CREATININE 1.65 (H) 12/25/2020   BUN 35 (H) 12/25/2020   NA 132 (L) 12/25/2020   K 4.9 12/25/2020   CL 96 12/25/2020   CO2 20 12/25/2020   Lab Results  Component Value Date   CHOL 164 02/10/2021   HDL 48 02/10/2021   LDLCALC 88 02/10/2021   TRIG 137 02/10/2021   CHOLHDL 2.1 02/19/2020   Lab Results  Component Value Date   TSH 3.720 12/25/2020   Lab Results  Component Value Date   HGBA1C 8.8 02/10/2021   Lab Results  Component Value Date   WBC 8.3 12/25/2020   HGB 10.7 (L) 12/25/2020   HCT 33.3 (L) 12/25/2020   MCV 92 12/25/2020   PLT 251 12/25/2020   Lab Results  Component Value Date   ALT 25 12/25/2020   AST 23 12/25/2020   ALKPHOS 107 12/25/2020   BILITOT <0.2 12/25/2020     Review of Systems  Constitutional:  Negative for chills, fatigue and fever.  Respiratory:  Negative for chest tightness and shortness of breath.   Cardiovascular:  Positive for chest pain (rib pain) and leg swelling.  Musculoskeletal:  Positive for arthralgias and gait problem.  Skin:  Positive for wound.   Patient Active Problem List   Diagnosis Date Noted   Acquired thrombophilia ([place]) 03/20/2021   Mood disorder ([place]) 05/02/2020   Chronic heart failure with preserved ejection fraction ([place]) 04/12/2020   Diverticulosis of large intestine without perforation or abscess with bleeding 03/27/2020   AF (paroxysmal atrial fibrillation) ([place])    Thrush    COPD (chronic obstructive pulmonary disease) with chronic bronchitis ([place])    CKD (chronic kidney disease),  stage IIIa 02/18/2020   Malnutrition of mild degree ([place]) 01/08/2020   Age-related macular degeneration, dry, left eye 06/21/2019   Age-related macular degeneration, wet, right eye ([place]) 06/21/2019   Moderate nonproliferative diabetic retinopathy associated with type 2 diabetes mellitus ([place]) 06/21/2019   Lumbosacral radiculopathy at L4 05/01/2019   Underweight 05/01/2019   Encounter for long-term (current) use of aspirin 10/31/2018   Encounter for long-term (current) use of antiplatelets/antithrombotics 10/31/2018   Long term current use of oral hypoglycemic drug 10/31/2018   Encounter for long-term (current) use of insulin ([place]) 10/31/2018   GIB (gastrointestinal bleeding) 08/11/2018   Leg pain 07/03/2017   Carpal tunnel syndrome on both sides 05/05/2017   Myalgia due to HMG CoA reductase inhibitor 05/05/2017   History of CVA (cerebrovascular accident) 02/15/2017   Degenerative disc disease, lumbar 12/21/2016   Atherosclerosis of native arteries of extremity with intermittent claudication ([place]) 12/20/2016   Bilateral carotid artery stenosis 12/20/2016   Occlusion and stenosis of bilateral carotid arteries 12/20/2016   Hip bursitis 05/18/2016   Elevated TSH 01/18/2016   CKD stage 3 due to type 2 diabetes mellitus ([place]) 01/16/2016   Senile ecchymosis 01/16/2016   Type II diabetes mellitus with renal manifestations ([place]) 09/16/2015   GERD (gastroesophageal reflux disease) 07/24/2015   PVD (  peripheral vascular disease) ([place]) 05/17/2015   Neoplasm of uncertain behavior of skin 05/17/2015   Retinopathy, diabetic, proliferative ([place]) 04/11/2015   Hyperlipidemia associated with type 2 diabetes mellitus ([place]) 04/11/2015   Essential hypertension 04/11/2015   Generalized OA 04/11/2015   Proliferative diabetic retinopathy(362.02) 04/11/2015   Arteriosclerosis of coronary artery 05/29/2013   Hypertensive heart disease without CHF 05/29/2013    Allergies  Allergen Reactions   Saxagliptin  Diarrhea   Epinephrine Other (See Comments)    Patient does not remember what happens when she uses this   Atorvastatin Other (See Comments)    Muscle aches   Codeine Other (See Comments)    Upset stomach   Ezetimibe Other (See Comments)    Myalgias(ZETIA)   Limonene Rash    Patient does not recall this reaction   Nitrofurantoin Rash and Other (See Comments)    Pruitus   Sulfa Antibiotics Rash and Other (See Comments)    Sore mouth     Past Surgical History:  Procedure Laterality Date   CARDIAC CATHETERIZATION  1998   40% LM, 95% Ramus interm   CATARACT EXTRACTION, BILATERAL     COLONOSCOPY WITH PROPOFOL N/A 08/12/2018   Procedure: COLONOSCOPY WITH PROPOFOL;  Surgeon: Lucilla Lame, MD;  Location: [facility] ENDOSCOPY;  Service: Endoscopy;  Laterality: N/A;   ENDARTERECTOMY FEMORAL Left 10/12/2018   Procedure: ENDARTERECTOMY FEMORAL;  Surgeon: Katha Cabal, MD;  Location: [facility] ORS;  Service: Vascular;  Laterality: Left;  angioplasty and left SFA stent placement   EYE SURGERY Bilateral    cataract extractions   LOWER EXTREMITY ANGIOGRAPHY Left 08/23/2017   Procedure: Lower Extremity Angiography;  Surgeon: Algernon Huxley, MD;  Location: [place] [facility];  Service: Cardiovascular;  Laterality: Left;   LOWER EXTREMITY ANGIOGRAPHY Left 07/26/2018   Procedure: LOWER EXTREMITY ANGIOGRAPHY;  Surgeon: Katha Cabal, MD;  Location: [place] [facility];  Service: Cardiovascular;  Laterality: Left;   LOWER EXTREMITY ANGIOGRAPHY Left 09/16/2018   Procedure: LOWER EXTREMITY ANGIOGRAPHY;  Surgeon: Katha Cabal, MD;  Location: [place] [facility];  Service: Cardiovascular;  Laterality: Left;   PTCA  08/2013   Left common iliac   PTCA  12/2012   left ext iliac   RIGHT HEART CATH N/A 02/23/2020   Procedure: RIGHT HEART CATH;  Surgeon: Minna Merritts, MD;  Location: [place] [facility];  Service: Cardiovascular;  Laterality: N/A;   TUBAL LIGATION      Social History    Tobacco Use   Smoking status: Former    Packs/day: 2.00    Years: 37.00    Pack years: 74.00    Types: Cigarettes    Quit date: 1980    Years since quitting: 42.5   Smokeless tobacco: Never   Tobacco comments:    smoking cessation materials not required  Vaping Use   Vaping Use: Never used  Substance Use Topics   Alcohol use: No    Alcohol/week: 0.0 standard drinks   Drug use: No     Medication list has been reviewed and updated.  Current Meds  Medication Sig   acetaminophen (TYLENOL) 650 MG CR tablet Take 1,300 mg by mouth every 8 (eight) hours as needed for pain.   albuterol (VENTOLIN HFA) 108 (90 Base) MCG/ACT inhaler Inhale 2 puffs into the lungs every 6 (six) hours as needed for wheezing or shortness of breath.   amLODipine (NORVASC) 5 MG tablet Take 5 mg by mouth in the morning and at bedtime.  B-D ULTRAFINE III SHORT PEN 31G X 8 MM MISC USE AS DIRECTED   cloNIDine (CATAPRES) 0.2 MG tablet Take 1 tablet (0.2 mg total) by mouth 2 (two) times daily. Please check blood pressure prior to taking medication.  Only take if blood pressure is greater than 960 systolic. (Patient taking differently: Take 0.2 mg by mouth as needed. Please check blood pressure prior to taking medication.  Only take if blood pressure is greater than 454 systolic.)   Cyanocobalamin 1000 MCG TBCR Take 1,000 mcg by mouth daily.   ELIQUIS 2.5 MG TABS tablet TAKE 1 TABLET(2.5 MG) BY MOUTH TWICE DAILY   feeding supplement, GLUCERNA SHAKE, (GLUCERNA SHAKE) LIQD Take 237 mLs by mouth 3 (three) times daily between meals.   ferrous sulfate 325 (65 FE) MG tablet Take 325 mg by mouth daily with breakfast.   gabapentin (NEURONTIN) 100 MG capsule Take 1 capsule (100 mg total) by mouth at bedtime.   HYDROcodone-acetaminophen (NORCO/VICODIN) 5-325 MG tablet Take 1 tablet by mouth every 6 (six) hours as needed.   insulin aspart (NOVOLOG FLEXPEN) 100 UNIT/ML FlexPen Inject 3 Units into the skin 3 (three) times daily  with meals. At lunch (Patient taking differently: Inject 20 Units into the skin daily. At breakfast- sliding scale)   Insulin Glargine (BASAGLAR KWIKPEN) 100 UNIT/ML INJECT 12 UNITS UNDER THE SKIN DAILY   losartan (COZAAR) 25 MG tablet Take 25 mg by mouth 2 (two) times daily. Unsure of dosage   melatonin 5 MG TABS Take 0.5 tablets (2.5 mg total) by mouth at bedtime as needed (sleep).   Multiple Vitamins-Minerals (PRESERVISION AREDS PO) Take 1 capsule by mouth 2 (two) times daily.    ondansetron (ZOFRAN ODT) 4 MG disintegrating tablet Take 1 tablet (4 mg total) by mouth every 6 (six) hours as needed for nausea or vomiting.   promethazine-dextromethorphan (PROMETHAZINE-DM) 6.25-15 MG/5ML syrup TAKE 5 ML BY MOUTH FOUR TIMES DAILY AS NEEDED FOR COUGH   rosuvastatin (CRESTOR) 5 MG tablet Take 5 mg by mouth every other day. Mon, Wed, Fri and Sat.   torsemide (DEMADEX) 20 MG tablet Take 2 tablets (40 mg total) by mouth daily. (Patient taking differently: Take 40 mg by mouth 2 (two) times daily. 1/2 tablet morning and 1/2 tablet at night.)    PHQ 2/9 Scores 05/09/2021 04/21/2021 03/20/2021 12/25/2020  PHQ - 2 Score 2 3 1  0  PHQ- 9 Score 10 16 5 4     GAD 7 : Generalized Anxiety Score 05/09/2021 04/21/2021 03/20/2021 12/25/2020  Nervous, Anxious, on Edge 1 0 1 0  Control/stop worrying 1 1 0 0  Worry too much - different things 1 0 0 0  Trouble relaxing 1 0 0 0  Restless 0 0 0 0  Easily annoyed or irritable 1 0 0 0  Afraid - awful might happen 0 0 0 0  Total GAD 7 Score 5 1 1  0  Anxiety Difficulty Not difficult at all - - -    BP Readings from Last 3 Encounters:  05/09/21 140/72  05/02/21 (!) 151/87  04/21/21 (!) 124/40    Physical Exam Vitals and nursing note reviewed.  Constitutional:      General: She is not in acute distress.    Appearance: Normal appearance. She is well-developed.  HENT:     Head: Normocephalic and atraumatic.  Cardiovascular:     Rate and Rhythm: Normal rate and regular  rhythm.  Pulmonary:     Effort: Pulmonary effort is normal. No respiratory distress.  Breath sounds: No wheezing or rhonchi.  Musculoskeletal:     Right lower leg: Edema (chronic) present.     Left lower leg: Edema (chronic) present.  Skin:    Findings: Wound (see photo) present.  Neurological:     Mental Status: She is alert and oriented to person, place, and time.  Psychiatric:        Mood and Affect: Mood normal.        Behavior: Behavior normal.     Wt Readings from Last 3 Encounters:  05/09/21 120 lb (54.4 kg)  05/01/21 123 lb (55.8 kg)  04/21/21 123 lb (55.8 kg)    BP 140/72 (BP Location: Right Arm, Cuff Size: Normal)   Pulse 69   Temp 97.8 F (36.6 C)   Ht 5' (1.524 m)   Wt 120 lb (54.4 kg)   SpO2 98%   BMI 23.44 kg/m   Assessment and Plan: 1. Cellulitis of left lower extremity Keep area clean; apply TAO and non stick dressing and wrap with gauze.  Change dressing daily. Call if worsening. - amoxicillin-clavulanate (AUGMENTIN) 875-125 MG tablet; Take 1 tablet by mouth 2 (two) times daily for 10 days.  Dispense: 20 tablet; Refill: 0  2. Open wound of left knee, initial encounter - Ambulatory referral to Wound Clinic   Partially dictated using Dragon software. Any errors are unintentional.  [name], MD [facility]  05/09/2021

## 2021-05-09 NOTE — Patient Instructions (Signed)
[address] [id number] [address] [facility]  [phone]  05/16/21   8 AM

## 2021-05-13 DIAGNOSIS — I2510 Atherosclerotic heart disease of native coronary artery without angina pectoris: Secondary | ICD-10-CM | POA: Diagnosis not present

## 2021-05-13 DIAGNOSIS — I5032 Chronic diastolic (congestive) heart failure: Secondary | ICD-10-CM | POA: Diagnosis not present

## 2021-05-13 DIAGNOSIS — I739 Peripheral vascular disease, unspecified: Secondary | ICD-10-CM | POA: Diagnosis not present

## 2021-05-13 DIAGNOSIS — I480 Paroxysmal atrial fibrillation: Secondary | ICD-10-CM | POA: Diagnosis not present

## 2021-05-13 DIAGNOSIS — Z789 Other specified health status: Secondary | ICD-10-CM | POA: Diagnosis not present

## 2021-05-16 ENCOUNTER — Other Ambulatory Visit: Payer: Self-pay

## 2021-05-16 ENCOUNTER — Encounter: Payer: Medicare Other | Attending: Physician Assistant | Admitting: Physician Assistant

## 2021-05-16 DIAGNOSIS — I130 Hypertensive heart and chronic kidney disease with heart failure and stage 1 through stage 4 chronic kidney disease, or unspecified chronic kidney disease: Secondary | ICD-10-CM | POA: Insufficient documentation

## 2021-05-16 DIAGNOSIS — I5042 Chronic combined systolic (congestive) and diastolic (congestive) heart failure: Secondary | ICD-10-CM | POA: Diagnosis not present

## 2021-05-16 DIAGNOSIS — E1122 Type 2 diabetes mellitus with diabetic chronic kidney disease: Secondary | ICD-10-CM | POA: Diagnosis not present

## 2021-05-16 DIAGNOSIS — E11622 Type 2 diabetes mellitus with other skin ulcer: Secondary | ICD-10-CM | POA: Insufficient documentation

## 2021-05-16 DIAGNOSIS — N189 Chronic kidney disease, unspecified: Secondary | ICD-10-CM | POA: Insufficient documentation

## 2021-05-16 DIAGNOSIS — Z7901 Long term (current) use of anticoagulants: Secondary | ICD-10-CM | POA: Diagnosis not present

## 2021-05-16 DIAGNOSIS — E1140 Type 2 diabetes mellitus with diabetic neuropathy, unspecified: Secondary | ICD-10-CM | POA: Diagnosis not present

## 2021-05-16 DIAGNOSIS — E1151 Type 2 diabetes mellitus with diabetic peripheral angiopathy without gangrene: Secondary | ICD-10-CM | POA: Insufficient documentation

## 2021-05-16 DIAGNOSIS — L97828 Non-pressure chronic ulcer of other part of left lower leg with other specified severity: Secondary | ICD-10-CM | POA: Diagnosis not present

## 2021-05-16 NOTE — Progress Notes (Signed)
ADALIA, Jill Hudson ([id number]) Visit Report for 05/16/2021 Allergy List Details Patient Name: Jill Hudson Date of Service: 05/16/2021 8:15 AM Medical Record Number: [id number] Patient Account Number: [id number] Date of Birth/Sex: Oct 07, 1935 (86 y.o. F) Treating RN: Jill Hudson Primary Care Jill Hudson: Jill Hudson Other Clinician: Referring Jill Hudson: Jill Hudson Treating Jill Hudson/Extender: Jill Hudson Weeks in Treatment: 0 Allergies Active Allergies saxagliptin epinephrine atorvastatin codeine ezetimibe limonene nitrofurantoin Sulfa (Sulfonamide Antibiotics) Allergy Notes Electronic Signature(s) Signed: 05/16/2021 9:49:13 AM By: Gretta Cool, BSN, RN, CWS, Kim RN, BSN Entered By: Gretta Cool, BSN, RN, CWS, Kim on 05/16/2021 08:45:12 Jill Hudson, Jill Hudson ([id number]) -------------------------------------------------------------------------------- Arrival Information Details Patient Name: Jill Hudson Date of Service: 05/16/2021 8:15 AM Medical Record Number: [id number] Patient Account Number: [id number] Date of Birth/Sex: Oct 29, 1934 (86 y.o. F) Treating RN: Jill Hudson Primary Care Jill Hudson: Jill Hudson Other Clinician: Referring Jill Hudson: Jill Hudson Treating Jill Hudson/Extender: Jill Hudson in Treatment: 0 Visit Information Patient Arrived: Jill Hudson Time: 08:39 Accompanied By: daughter, Jill Hudson Transfer Assistance: None Patient Identification Verified: Yes Secondary Verification Process Completed: Yes Patient Has Alerts: Yes Patient Alerts: Patient on Blood Thinner Eliquis Type 2 Diabetic Electronic Signature(s) Signed: 05/16/2021 9:49:13 AM By: Gretta Cool, BSN, RN, CWS, Kim RN, BSN Entered By: Gretta Cool, BSN, RN, CWS, Kim on 05/16/2021 08:40:40 Jill Hudson ([id number]) -------------------------------------------------------------------------------- Clinic Level of Care Assessment Details Patient Name: Jill Hudson Date of Service: 05/16/2021 8:15 AM Medical Record  Number: [id number] Patient Account Number: [id number] Date of Birth/Sex: 1935-03-28 (86 y.o. F) Treating RN: Jill Hudson Primary Care Jill Hudson: Jill Hudson Other Clinician: Referring Jill Hudson: Jill Hudson Treating Jill Hudson/Extender: Jill Hudson in Treatment: 0 Clinic Level of Care Assessment Items TOOL 1 Quantity Score []  - Use when EandM and Procedure is performed on INITIAL visit 0 ASSESSMENTS - Nursing Assessment / Reassessment X - General Physical Exam (combine w/ comprehensive assessment (listed just below) when performed on new 1 20 pt. evals) X- 1 25 Comprehensive Assessment (HX, ROS, Risk Assessments, Wounds Hx, etc.) ASSESSMENTS - Wound and Skin Assessment / Reassessment []  - Dermatologic / Skin Assessment (not related to wound area) 0 ASSESSMENTS - Ostomy and/or Continence Assessment and Care []  - Incontinence Assessment and Management 0 []  - 0 Ostomy Care Assessment and Management (repouching, etc.) PROCESS - Coordination of Care X - Simple Patient / Family Education for ongoing care 1 15 []  - 0 Complex (extensive) Patient / Family Education for ongoing care X- 1 10 Staff obtains Programmer, systems, Records, Test Results / Process Orders []  - 0 Staff telephones HHA, Nursing Homes / Clarify orders / etc []  - 0 Routine Transfer to another Facility (non-emergent condition) []  - 0 Routine Hospital Admission (non-emergent condition) X- 1 15 New Admissions / Biomedical engineer / Ordering NPWT, Apligraf, etc. []  - 0 Emergency Hospital Admission (emergent condition) PROCESS - Special Needs []  - Pediatric / Minor Patient Management 0 []  - 0 Isolation Patient Management []  - 0 Hearing / Language / Visual special needs []  - 0 Assessment of Community assistance (transportation, D/C planning, etc.) []  - 0 Additional assistance / Altered mentation []  - 0 Support Surface(s) Assessment (bed, cushion, seat, etc.) INTERVENTIONS - Miscellaneous []  - External ear exam  0 []  - 0 Patient Transfer (multiple staff / Civil Service fast streamer / Similar devices) []  - 0 Simple Staple / Suture removal (25 or less) []  - 0 Complex Staple / Suture removal (26 or more) []  - 0 Hypo/Hyperglycemic Management (do not check if billed separately) []  - 0 Ankle / Brachial Index (ABI) -  do not check if billed separately Has the patient been seen at the hospital within the last three years: Yes Total Score: 85 Level Of Care: New/Established - Level 3 Jill Hudson, Jill Hudson ([id number]) Electronic Signature(s) Signed: 05/16/2021 9:49:13 AM By: Gretta Cool, BSN, RN, CWS, Kim RN, BSN Entered By: Gretta Cool, BSN, RN, CWS, Kim on 05/16/2021 09:46:50 Jill Hudson, Jill Hudson ([id number]) -------------------------------------------------------------------------------- Encounter Discharge Information Details Patient Name: Jill Hudson Date of Service: 05/16/2021 8:15 AM Medical Record Number: [id number] Patient Account Number: [id number] Date of Birth/Sex: Jan 04, 1935 (86 y.o. F) Treating RN: Jill Hudson Primary Care Mailin Coglianese: Jill Hudson Other Clinician: Referring Shontavia Mickel: Jill Hudson Treating Laresa Oshiro/Extender: Jill Hudson in Treatment: 0 Encounter Discharge Information Items Post Procedure Vitals Discharge Condition: Stable Temperature (F): 97.9 Ambulatory Status: Cane Pulse (bpm): 76 Discharge Destination: Home Respiratory Rate (breaths/min): 16 Transportation: Private Auto Blood Pressure (mmHg): 157/68 Accompanied By: daughter, Jill Hudson Schedule Follow-up Appointment: Yes Clinical Summary of Care: Electronic Signature(s) Signed: 05/16/2021 9:48:57 AM By: Gretta Cool, BSN, RN, CWS, Kim RN, BSN Entered By: Gretta Cool, BSN, RN, CWS, Kim on 05/16/2021 09:48:57 Jill Hudson ([id number]) -------------------------------------------------------------------------------- Lower Extremity Assessment Details Patient Name: Jill Hudson Date of Service: 05/16/2021 8:15 AM Medical Record Number:  [id number] Patient Account Number: [id number] Date of Birth/Sex: Mar 15, 1935 (86 y.o. F) Treating RN: Jill Hudson Primary Care Tyreece Gelles: Jill Hudson Other Clinician: Referring Lary Eckardt: Jill Hudson Treating Seraphina Mitchner/Extender: Jill Hudson Weeks in Treatment: 0 Edema Assessment Assessed: Shirlyn Goltz: No] [Right: No] [Left: Edema] [Right: :] Calf Left: Right: Point of Measurement: 30 cm From Medial Instep 31 cm 30 cm Ankle Left: Right: Point of Measurement: 12 cm From Medial Instep 24 cm 21 cm Knee To Floor Left: Right: From Medial Instep 38 cm 38 cm Vascular Assessment Pulses: Dorsalis Pedis Palpable: [Left:Yes] [Right:Yes] Doppler Audible: [Left:Yes] [Right:Yes] Posterior Tibial Palpable: [Left:Yes Yes] [Right:Yes Yes] Notes ABI completed on 11/28/20. Electronic Signature(s) Signed: 05/16/2021 9:49:13 AM By: Gretta Cool, BSN, RN, CWS, Kim RN, BSN Entered By: Gretta Cool, BSN, RN, CWS, Kim on 05/16/2021 09:02:19 Jill Hudson, Jill Hudson ([id number]) -------------------------------------------------------------------------------- Multi Wound Chart Details Patient Name: Jill Hudson Date of Service: 05/16/2021 8:15 AM Medical Record Number: [id number] Patient Account Number: [id number] Date of Birth/Sex: September 12, 1935 (86 y.o. F) Treating RN: Jill Hudson Primary Care Tegh Franek: Jill Hudson Other Clinician: Referring Katiejo Gilroy: Jill Hudson Treating Kolby Schara/Extender: Jill Hudson Weeks in Treatment: 0 Vital Signs Height(in): 60 Pulse(bpm): 97 Weight(lbs): 120 Blood Pressure(mmHg): 157/68 Body Mass Index(BMI): 23 Temperature(F): 97.9 Respiratory Rate(breaths/min): 16 Photos: [N/A:N/A] Wound Location: Left, Medial Knee N/A N/A Wounding Event: Trauma N/A N/A Primary Etiology: Trauma, Other N/A N/A Comorbid History: Cataracts, Chronic Obstructive N/A N/A Pulmonary Disease (COPD), Angina, Congestive Heart Failure, Coronary Artery Disease, Hypertension, Myocardial Infarction,  Peripheral Arterial Disease, Type II Diabetes, Osteoarthritis, Dementia, Neuropathy Date Acquired: 05/02/2021 N/A N/A Weeks of Treatment: 0 N/A N/A Wound Status: Open N/A N/A Measurements L x W x D (cm) 3x3.5x0.1 N/A N/A Area (cm) : 8.247 N/A N/A Volume (cm) : 0.825 N/A N/A % Reduction in Area: 0.00% N/A N/A % Reduction in Volume: 0.00% N/A N/A Classification: Unclassifiable N/A N/A Exudate Amount: Medium N/A N/A Exudate Type: Serous N/A N/A Exudate Color: amber N/A N/A Granulation Amount: None Present (0%) N/A N/A Necrotic Amount: Large (67-100%) N/A N/A Necrotic Tissue: Eschar N/A N/A Exposed Structures: Fascia: No N/A N/A Fat Layer (Subcutaneous Tissue): No Tendon: No Muscle: No Joint: No Bone: No Epithelialization: None N/A N/A Treatment Notes Electronic Signature(s) Signed: 05/16/2021 9:49:13 AM By: Gretta Cool, BSN, RN, CWS, Kim RN,  BSN Entered By: Gretta Cool, BSN, RN, CWS, Kim on 05/16/2021 09:27:21 Jill Hudson, Jill Hudson ([id number], Jill Hudson ([id number]) -------------------------------------------------------------------------------- Multi-Disciplinary Care Plan Details Patient Name: Jill Hudson Date of Service: 05/16/2021 8:15 AM Medical Record Number: [id number] Patient Account Number: [id number] Date of Birth/Sex: 24-Feb-1935 (86 y.o. F) Treating RN: Jill Hudson Primary Care Daquon Greenleaf: Jill Hudson Other Clinician: Referring Zhoey Blackstock: Jill Hudson Treating Andersyn Fragoso/Extender: Jill Hudson in Treatment: 0 Active Inactive Necrotic Tissue Nursing Diagnoses: Impaired tissue integrity related to necrotic/devitalized tissue Knowledge deficit related to management of necrotic/devitalized tissue Goals: Necrotic/devitalized tissue will be minimized in the wound bed Date Initiated: 05/16/2021 Target Resolution Date: 05/15/2021 Goal Status: Active Patient/caregiver will verbalize understanding of reason and process for debridement of necrotic tissue Date Initiated:  05/16/2021 Target Resolution Date: 05/15/2021 Goal Status: Active Interventions: Assess patient pain level pre-, during and post procedure and prior to discharge Provide education on necrotic tissue and debridement process Treatment Activities: Excisional debridement : 05/16/2021 Notes: Orientation to the Wound Care Program Nursing Diagnoses: Knowledge deficit related to the wound healing center program Goals: Patient/caregiver will verbalize understanding of the [facility] Program Date Initiated: 05/16/2021 Target Resolution Date: 05/15/2021 Goal Status: Active Interventions: Provide education on orientation to the wound center Notes: Wound/Skin Impairment Nursing Diagnoses: Impaired tissue integrity Knowledge deficit related to smoking impact on wound healing Knowledge deficit related to ulceration/compromised skin integrity Goals: Patient/caregiver will verbalize understanding of skin care regimen Date Initiated: 05/16/2021 Target Resolution Date: 05/15/2021 Goal Status: Active Ulcer/skin breakdown will have a volume reduction of 30% by week 4 Date Initiated: 05/16/2021 Target Resolution Date: 06/16/2021 Goal Status: Active Interventions: Jill Hudson, Jill Hudson ([id number]) Assess ulceration(s) every visit Treatment Activities: Skin care regimen initiated : 05/16/2021 Topical wound management initiated : 05/16/2021 Notes: Electronic Signature(s) Signed: 05/16/2021 9:49:13 AM By: Gretta Cool, BSN, RN, CWS, Kim RN, BSN Entered By: Gretta Cool, BSN, RN, CWS, Kim on 05/16/2021 09:27:08 Jill Hudson, Jill Hudson ([id number]) -------------------------------------------------------------------------------- Pain Assessment Details Patient Name: Jill Hudson Date of Service: 05/16/2021 8:15 AM Medical Record Number: [id number] Patient Account Number: [id number] Date of Birth/Sex: 1935/07/20 (86 y.o. F) Treating RN: Jill Hudson Primary Care Zanna Hawn: Jill Hudson Other Clinician: Referring Kamara Allan:  Jill Hudson Treating Junaid Wurzer/Extender: Jill Hudson Weeks in Treatment: 0 Active Problems Location of Pain Severity and Description of Pain Patient Has Paino No Site Locations Pain Management and Medication Current Pain Management: Notes Patient states wound is tender, no pain. Electronic Signature(s) Signed: 05/16/2021 9:49:13 AM By: Gretta Cool, BSN, RN, CWS, Kim RN, BSN Entered By: Gretta Cool, BSN, RN, CWS, Kim on 05/16/2021 08:41:16 Jill Hudson, Jill Hudson ([id number]) -------------------------------------------------------------------------------- Patient/Caregiver Education Details Patient Name: Jill Hudson Date of Service: 05/16/2021 8:15 AM Medical Record Number: [id number] Patient Account Number: [id number] Date of Birth/Gender: 1935-05-22 (86 y.o. F) Treating RN: Jill Hudson Primary Care Physician: Jill Hudson Other Clinician: Referring Physician: Halina Hudson Treating Physician/Extender: Jill Hudson in Treatment: 0 Education Assessment Education Provided To: Patient and Caregiver Education Topics Provided Elevated Blood Sugar/ Impact on Healing: Handouts: Elevated Blood Sugars: How Do They Affect Wound Healing Methods: Explain/Verbal Responses: State content correctly Welcome To [facility]: Handouts: Welcome To [facility] Methods: Explain/Verbal Responses: State content correctly Wound Debridement: Handouts: Wound Debridement Methods: Demonstration, Explain/Verbal Responses: State content correctly Electronic Signature(s) Signed: 05/16/2021 9:49:13 AM By: Gretta Cool, BSN, RN, CWS, Kim RN, BSN Entered By: Gretta Cool, BSN, RN, CWS, Kim on 05/16/2021 09:47:54 Jill Hudson, Jill Hudson ([id number]) -------------------------------------------------------------------------------- Wound Assessment Details Patient Name: Jill Hudson Date of  Service: 05/16/2021 8:15 AM Medical Record Number: [id number] Patient Account Number: [id number] Date of Birth/Sex: 25-Aug-1935  (86 y.o. F) Treating RN: Jill Hudson Primary Care Nancylee Gaines: Jill Hudson Other Clinician: Referring Nicoletta Hush: Jill Hudson Treating Ajia Chadderdon/Extender: Jill Hudson Weeks in Treatment: 0 Wound Status Wound Number: 1 Primary Trauma, Other Etiology: Wound Location: Left, Medial Knee Wound Open Wounding Event: Trauma Status: Date Acquired: 05/02/2021 Comorbid Cataracts, Chronic Obstructive Pulmonary Disease (COPD), Weeks Of Treatment: 0 History: Angina, Congestive Heart Failure, Coronary Artery Disease, Clustered Wound: No Hypertension, Myocardial Infarction, Peripheral Arterial Disease, Type II Diabetes, Osteoarthritis, Dementia, Neuropathy Photos Wound Measurements Length: (cm) 3 % Red Width: (cm) 3.5 % Red Depth: (cm) 0.1 Epith Area: (cm) 8.247 Tunn Volume: (cm) 0.825 Unde uction in Area: 0% uction in Volume: 0% elialization: None eling: No rmining: No Wound Description Classification: Unclassifiable Foul Exudate Amount: Medium Sloug Exudate Type: Serous Exudate Color: amber Odor After Cleansing: No h/Fibrino No Wound Bed Granulation Amount: None Present (0%) Exposed Structure Necrotic Amount: Large (67-100%) Fascia Exposed: No Necrotic Quality: Eschar Fat Layer (Subcutaneous Tissue) Exposed: No Tendon Exposed: No Muscle Exposed: No Joint Exposed: No Bone Exposed: No Treatment Notes Wound #1 (Knee) Wound Laterality: Left, Medial Cleanser Peri-Wound Care Topical Seney, Asalee C. ([id number]) Primary Dressing Xeroform-HBD 2x2 (in/in) Discharge Instruction: Apply Xeroform-HBD 2x2 (in/in) as directed Secondary Dressing Flippin Dressing, 4x4 (in/in) Discharge Instruction: Apply over dressing to secure in place. Secured With Compression Wrap Compression Stockings Environmental education officer) Signed: 05/16/2021 9:11:29 AM By: Gretta Cool, BSN, RN, CWS, Kim RN, BSN Entered By: Gretta Cool, BSN, RN, CWS, Kim on 05/16/2021 09:11:28 ALDEAN, SUDDETH  ([id number]) -------------------------------------------------------------------------------- Vitals Details Patient Name: Jill Hudson Date of Service: 05/16/2021 8:15 AM Medical Record Number: [id number] Patient Account Number: [id number] Date of Birth/Sex: 08/24/1935 (86 y.o. F) Treating RN: Jill Hudson Primary Care Sonja Manseau: Jill Hudson Other Clinician: Referring Corben Auzenne: Jill Hudson Treating Laveta Gilkey/Extender: Jill Hudson Weeks in Treatment: 0 Vital Signs Time Taken: 08:41 Temperature (F): 97.9 Height (in): 60 Pulse (bpm): 76 Weight (lbs): 120 Respiratory Rate (breaths/min): 16 Body Mass Index (BMI): 23.4 Blood Pressure (mmHg): 157/68 Reference Range: 80 - 120 mg / dl Electronic Signature(s) Signed: 05/16/2021 9:49:13 AM By: Gretta Cool, BSN, RN, CWS, Kim RN, BSN Entered By: Gretta Cool, BSN, RN, CWS, Kim on 05/16/2021 08:41:48

## 2021-05-16 NOTE — Progress Notes (Signed)
JERONICA, STLOUIS ([id number]) Visit Report for 05/16/2021 Abuse/Suicide Risk Screen Details Patient Name: Jill Hudson, Jill Hudson Date of Service: 05/16/2021 8:15 AM Medical Record Number: [id number] Patient Account Number: [id number] Date of Birth/Sex: [birth year] (86 y.o. F) Treating RN: Cornell Barman Primary Care Marca Gadsby: [name] Other Clinician: Referring Shauntell Iglesia: [name] Treating Fernado Brigante/Extender: Jeri Cos Weeks in Treatment: 0 Abuse/Suicide Risk Screen Items Answer ABUSE RISK SCREEN: Has anyone close to you tried to hurt or harm you recentlyo No Do you feel uncomfortable with anyone in your familyo No Has anyone forced you do things that you didnot want to doo No Electronic Signature(s) Signed: 05/16/2021 9:49:13 AM By: Gretta Cool, BSN, RN, CWS, Kim RN, BSN Entered By: Gretta Cool, BSN, RN, CWS, Kim on 05/16/2021 08:51:26 Blankenship, [name] ([id number]) -------------------------------------------------------------------------------- Activities of Daily Living Details Patient Name: Jill Hudson Date of Service: 05/16/2021 8:15 AM Medical Record Number: [id number] Patient Account Number: [id number] Date of Birth/Sex: 1935/01/14 (86 y.o. F) Treating RN: Cornell Barman Primary Care Hulon Ferron: [name] Other Clinician: Referring Deserae Jennings: [name] Treating Demetri Goshert/Extender: Skipper Cliche in Treatment: 0 Activities of Daily Living Items Answer Activities of Daily Living (Please select one for each item) Drive Automobile Not Able Take Medications Completely Able Use Telephone Completely Able Care for Appearance Completely Able Use Toilet Completely Able Bath / Shower Completely Able Dress Self Completely Able Feed Self Completely Able Walk Completely Able Get In / Out Bed Completely Able Housework Completely Able Prepare Meals Completely [place] Need Assistance Shop for Self Need Assistance Electronic Signature(s) Signed: 05/16/2021 9:49:13 AM By: Gretta Cool,  BSN, RN, CWS, Kim RN, BSN Entered By: Gretta Cool, BSN, RN, CWS, Kim on 05/16/2021 08:51:52 DEZAREE, TRACEY ([id number]) -------------------------------------------------------------------------------- Education Screening Details Patient Name: Jill Hudson Date of Service: 05/16/2021 8:15 AM Medical Record Number: [id number] Patient Account Number: [id number] Date of Birth/Sex: 02-Oct-1935 (86 y.o. F) Treating RN: Cornell Barman Primary Care Anaia Frith: [name] Other Clinician: Referring Dasan Hardman: [name] Treating Firmin Belisle/Extender: Skipper Cliche in Treatment: 0 Primary Learner Assessed: Patient Learning Preferences/Education Level/Primary Language Learning Preference: Explanation, Demonstration Highest Education Level: High School Preferred Language: English Cognitive Barrier Language Barrier: No Translator Needed: No Memory Deficit: No Emotional Barrier: No Physical Barrier Impaired Vision: Yes Glasses Impaired Hearing: No Decreased Hand dexterity: No Knowledge/Comprehension Knowledge Level: Medium Comprehension Level: Medium Ability to understand written instructions: Medium Ability to understand verbal instructions: Medium Motivation Anxiety Level: Calm Cooperation: Cooperative Education Importance: Acknowledges Need Interest in Health Problems: Asks Questions Perception: Coherent Willingness to Engage in Self-Management High Activities: Readiness to Engage in Self-Management High Activities: Notes Patient is a little confused at times, daughter reassures her answers. Electronic Signature(s) Signed: 05/16/2021 9:49:13 AM By: Gretta Cool, BSN, RN, CWS, Kim RN, BSN Entered By: Gretta Cool, BSN, RN, CWS, Kim on 05/16/2021 08:52:49 TREVA, HUYETT ([id number]) -------------------------------------------------------------------------------- Fall Risk Assessment Details Patient Name: Jill Hudson Date of Service: 05/16/2021 8:15 AM Medical Record Number:  [id number] Patient Account Number: [id number] Date of Birth/Sex: Aug 08, 1935 (86 y.o. F) Treating RN: Cornell Barman Primary Care Jaxton Casale: [name] Other Clinician: Referring Arieana Somoza: [name] Treating Breawna Montenegro/Extender: Skipper Cliche in Treatment: 0 Fall Risk Assessment Items Have you had 2 or more falls in the last 12 monthso 0 Yes Have you had any fall that resulted in injury in the last 12 monthso 0 Yes FALLS RISK SCREEN History of falling - immediate or within 3 months 0 No Secondary diagnosis (Do you have 2 or more medical diagnoseso) 0 No Ambulatory  aid None/bed rest/wheelchair/nurse 0 No Crutches/cane/walker 15 Yes Furniture 0 No Intravenous therapy Access/Saline/Heparin Lock 0 No Gait/Transferring Normal/ bed rest/ wheelchair 0 No Weak (short steps with or without shuffle, stooped but able to lift head while walking, may 10 Yes seek support from furniture) Impaired (short steps with shuffle, may have difficulty arising from chair, head down, impaired 0 No balance) Mental Status Oriented to own ability 0 Yes Electronic Signature(s) Signed: 05/16/2021 9:49:13 AM By: Gretta Cool, BSN, RN, CWS, Kim RN, BSN Entered By: Gretta Cool, BSN, RN, CWS, Kim on 05/16/2021 08:53:29 Jill Hudson ([id number]) -------------------------------------------------------------------------------- Foot Assessment Details Patient Name: Jill Hudson Date of Service: 05/16/2021 8:15 AM Medical Record Number: [id number] Patient Account Number: [id number] Date of Birth/Sex: 08/12/1935 (86 y.o. F) Treating RN: Cornell Barman Primary Care Avis Mcmahill: [name] Other Clinician: Referring Curtez Brallier: [name] Treating Lesette Frary/Extender: Jeri Cos Weeks in Treatment: 0 Foot Assessment Items Site Locations + = Sensation present, - = Sensation absent, C = Callus, U = Ulcer R = Redness, W = Warmth, M = Maceration, PU = Pre-ulcerative lesion F = Fissure, S = Swelling, D =  Dryness Assessment Right: Left: Other Deformity: No No Prior Foot Ulcer: No No Prior Amputation: No No Charcot Joint: No No Ambulatory Status: Ambulatory With Help Assistance Device: Cane GaitEnergy manager) Signed: 05/16/2021 9:49:13 AM By: Gretta Cool, BSN, RN, CWS, Kim RN, BSN Entered By: Gretta Cool, BSN, RN, CWS, Kim on 05/16/2021 08:57:29 Almas, [name] ([id number]) -------------------------------------------------------------------------------- Nutrition Risk Screening Details Patient Name: Jill Hudson Date of Service: 05/16/2021 8:15 AM Medical Record Number: [id number] Patient Account Number: [id number] Date of Birth/Sex: December 15, 1934 (86 y.o. F) Treating RN: Cornell Barman Primary Care Johnathon Mittal: [name] Other Clinician: Referring Nile Prisk: [name] Treating Ephraim Reichel/Extender: Jeri Cos Weeks in Treatment: 0 Height (in): 60 Weight (lbs): 120 Body Mass Index (BMI): 23.4 Nutrition Risk Screening Items Score Screening NUTRITION RISK SCREEN: I have an illness or condition that made me change the kind and/or amount of food I eat 0 No I eat fewer than two meals per day 0 No I eat few fruits and vegetables, or milk products 0 No I have three or more drinks of beer, liquor or wine almost every day 0 No I have tooth or mouth problems that make it hard for me to eat 0 No I don't always have enough money to buy the food I need 0 No I eat alone most of the time 0 No I take three or more different prescribed or over-the-counter drugs a day 1 Yes Without wanting to, I have lost or gained 10 pounds in the last six months 0 No I am not always physically able to shop, cook and/or feed myself 0 No Nutrition Protocols Good Risk Protocol 0 No interventions needed Moderate Risk Protocol High Risk Proctocol Risk Level: Good Risk Score: 1 Electronic Signature(s) Signed: 05/16/2021 9:49:13 AM By: Gretta Cool, BSN, RN, CWS, Kim RN, BSN Entered By: Gretta Cool, BSN, RN, CWS, Kim on  05/16/2021 08:54:01

## 2021-05-17 NOTE — Progress Notes (Signed)
Jill, Hudson ([id number]) Visit Report for 05/16/2021 Chief Complaint Document Details Patient Name: Jill Hudson, Jill Hudson Date of Service: 05/16/2021 8:15 AM Medical Record Number: [id number] Patient Account Number: [id number] Date of Birth/Sex: 1934/12/21 (86 y.o. F) Treating RN: Cornell Barman Primary Care Provider: [name] Other Clinician: Referring Provider: [name] Treating Provider/Extender: Jeri Cos Weeks in Treatment: 0 Information Obtained from: Patient Chief Complaint Right knee trauma/laceration Electronic Signature(s) Signed: 05/16/2021 9:16:38 AM By: Worthy Keeler PA-C Entered By: Worthy Keeler on 05/16/2021 09:16:38 Jill Hudson, Jill Hudson ([id number]) -------------------------------------------------------------------------------- Debridement Details Patient Name: Jill Hudson Date of Service: 05/16/2021 8:15 AM Medical Record Number: [id number] Patient Account Number: [id number] Date of Birth/Sex: 12/21/1934 (86 y.o. F) Treating RN: Cornell Barman Primary Care Provider: [name] Other Clinician: Referring Provider: [name] Treating Provider/Extender: Jeri Cos Weeks in Treatment: 0 Debridement Performed for Wound #1 Left,Medial Knee Assessment: Performed By: Physician Tommie Sams., PA-C Debridement Type: Debridement Level of Consciousness (Pre- Awake and Alert procedure): Pre-procedure Verification/Time Out Yes - 09:25 Taken: Total Area Debrided (L x W): 3 (cm) x 3.5 (cm) = 10.5 (cm) Tissue and other material Viable, Non-Viable, Eschar, Slough, Subcutaneous, Slough debrided: Level: Skin/Subcutaneous Tissue Debridement Description: Excisional Instrument: Curette Bleeding: Moderate Hemostasis Achieved: Pressure Response to Treatment: Procedure was tolerated well Level of Consciousness (Post- Awake and Alert procedure): Post Debridement Measurements of Total Wound Length: (cm) 3 Width: (cm) 3.5 Depth: (cm) 0.2 Volume: (cm)  1.649 Character of Wound/Ulcer Post Debridement: Stable Post Procedure Diagnosis Same as Pre-procedure Electronic Signature(s) Signed: 05/16/2021 9:49:13 AM By: Gretta Cool, BSN, RN, CWS, Kim RN, BSN Signed: 05/16/2021 6:45:55 PM By: Worthy Keeler PA-C Entered By: Gretta Cool, BSN, RN, CWS, Kim on 05/16/2021 09:28:09 Jill, Hudson ([id number]) -------------------------------------------------------------------------------- HPI Details Patient Name: Jill Hudson Date of Service: 05/16/2021 8:15 AM Medical Record Number: [id number] Patient Account Number: [id number] Date of Birth/Sex: [birth year] (86 y.o. F) Treating RN: Cornell Barman Primary Care Provider: [name] Other Clinician: Referring Provider: [name] Treating Provider/Extender: Jeri Cos Weeks in Treatment: 0 History of Present Illness HPI Description: 05/16/2021 upon evaluation today patient presents today for initial evaluation here in the clinic concerning a wound that happened as a result of trauma on the left medial knee. She currently tells me that she fell and scraped this area this caused a wound that has been somewhat painful for her. Right now it has a scab covering the majority of it which is a combination again of bleeding as well as drainage and hardening of the tissue. She tells me that the majority of the skin tore off whenever she hit and what did not she trimmed off. Nonetheless she currently tells me that was about 2 weeks ago and at this point she is seeing some improvement she feels like is starting to make turn around but nonetheless still has a significant wound here that she feels like she could use to help with. She does have a history of congestive heart failure, chronic kidney disease, hypertension, diabetes, and is on anticoagulant therapy. Fortunately it does not look like we can have to have a tremendous and extensive debridement here but we do need to get the eschar off of the surface of the wound to  allow this to heal. Electronic Signature(s) Signed: 05/16/2021 6:40:42 PM By: Worthy Keeler PA-C Entered By: Worthy Keeler on 05/16/2021 18:40:42 Jill Hudson, Jill Hudson ([id number]) -------------------------------------------------------------------------------- Physical Exam Details Patient Name: Jill Hudson Date of Service: 05/16/2021 8:15 AM Medical Record  Number: [id number] Patient Account Number: [id number] Date of Birth/Sex: [birth year] (86 y.o. F) Treating RN: Cornell Barman Primary Care Provider: [name] Other Clinician: Referring Provider: [name] Treating Provider/Extender: Jeri Cos Weeks in Treatment: 0 Constitutional patient is hypertensive.. pulse regular and within target range for patient.Marland Kitchen respirations regular, non-labored and within target range for patient.Marland Kitchen temperature within target range for patient.. Well-nourished and well-hydrated in no acute distress. Eyes conjunctiva clear no eyelid edema noted. pupils equal round and reactive to light and accommodation. Ears, Nose, Mouth, and Throat no gross abnormality of ear auricles or external auditory canals. normal hearing noted during conversation. mucus membranes moist. Respiratory normal breathing without difficulty. Cardiovascular 2+ dorsalis pedis/posterior tibialis pulses. no clubbing, cyanosis, significant edema, <3 sec cap refill. Musculoskeletal normal gait and posture. no significant deformity or arthritic changes, no loss or range of motion, no clubbing. Psychiatric this patient is able to make decisions and demonstrates good insight into disease process. Alert and Oriented x 3. pleasant and cooperative. Notes Upon inspection patient's wound bed actually showed signs of being eschar covered initially. Actually perform sharp debridement to clear some of this eschar off in fact I was able to get everything off carefully she did not have any extensive drainage or bleeding and postdebridement the wound  bed appears to be doing significantly better which is great news. I do believe that she would benefit from a dressing to keep this from drying out I really think collagen would be the ideal thing right now and will cover this with a border foam dressing. If that becomes too dry we may even consider Xeroform we will see how this goes. Electronic Signature(s) Signed: 05/16/2021 6:41:18 PM By: Worthy Keeler PA-C Entered By: Worthy Keeler on 05/16/2021 18:41:18 Jill Hudson, Jill Hudson ([id number]) -------------------------------------------------------------------------------- Physician Orders Details Patient Name: Jill Hudson Date of Service: 05/16/2021 8:15 AM Medical Record Number: [id number] Patient Account Number: [id number] Date of Birth/Sex: [birth year] (86 y.o. F) Treating RN: Cornell Barman Primary Care Provider: [name] Other Clinician: Referring Provider: [name] Treating Provider/Extender: Skipper Cliche in Treatment: 0 Verbal / Phone Orders: No Diagnosis Coding ICD-10 Coding Code Description [phone] Laceration without foreign body, left knee, initial encounter L97.828 Non-pressure chronic ulcer of other part of left lower leg with other specified severity I50.42 Chronic combined systolic (congestive) and diastolic (congestive) heart failure N18.9 Chronic kidney disease, unspecified I10 Essential (primary) hypertension E11.622 Type 2 diabetes mellitus with other skin ulcer Z79.01 Long term (current) use of anticoagulants Follow-up Appointments o Return Appointment in 1 week. Bathing/ Shower/ Hygiene o Wash wounds with antibacterial soap and water. o May shower; gently cleanse wound with antibacterial soap, rinse and pat dry prior to dressing wounds o No tub bath. Edema Control - Lymphedema / Segmental Compressive Device / Other o Elevate, Exercise Daily and Avoid Standing for Long Periods of Time. o Elevate legs to the level of the heart and pump  ankles as often as possible o Elevate leg(s) parallel to the floor when sitting. Additional Orders / Instructions o Follow Nutritious Diet and Increase Protein Intake Wound Treatment Wound #1 - Knee Wound Laterality: Left, Medial Primary Dressing: Xeroform-HBD 2x2 (in/in) (DME) (Generic) 1 x Per Day/15 Days Discharge Instructions: Apply Xeroform-HBD 2x2 (in/in) as directed Secondary Dressing: Telfa Adhesive [place] Dressing, 4x4 (in/in) 1 x Per Day/15 Days Discharge Instructions: Apply over dressing to secure in place. Electronic Signature(s) Signed: 05/16/2021 9:49:13 AM By: Gretta Cool, BSN, RN, CWS, Kim RN, BSN Signed: 05/16/2021 6:45:55 PM By:  Melburn Hake, Elba Dendinger PA-C Entered By: Gretta Cool, BSN, RN, CWS, Kim on 05/16/2021 09:30:03 Jill Hudson, Jill Hudson ([id number]) -------------------------------------------------------------------------------- Problem List Details Patient Name: Jill Hudson Date of Service: 05/16/2021 8:15 AM Medical Record Number: [id number] Patient Account Number: [id number] Date of Birth/Sex: 09/05/1935 (86 y.o. F) Treating RN: Cornell Barman Primary Care Provider: [name] Other Clinician: Referring Provider: [name] Treating Provider/Extender: Jeri Cos Weeks in Treatment: 0 Active Problems ICD-10 Encounter Code Description Active Date MDM Diagnosis S81.012A Laceration without foreign body, left knee, initial encounter 05/16/2021 No Yes L97.828 Non-pressure chronic ulcer of other part of left lower leg with other 05/16/2021 No Yes specified severity I50.42 Chronic combined systolic (congestive) and diastolic (congestive) heart 05/16/2021 No Yes failure N18.9 Chronic kidney disease, unspecified 05/16/2021 No Yes I10 Essential (primary) hypertension 05/16/2021 No Yes E11.622 Type 2 diabetes mellitus with other skin ulcer 05/16/2021 No Yes Z79.01 Long term (current) use of anticoagulants 05/16/2021 No Yes Inactive Problems Resolved Problems Electronic  Signature(s) Signed: 05/16/2021 9:16:25 AM By: Worthy Keeler PA-C Entered By: Worthy Keeler on 05/16/2021 09:16:25 Brodman, Jill Hudson ([id number]) -------------------------------------------------------------------------------- Progress Note Details Patient Name: Jill Hudson Date of Service: 05/16/2021 8:15 AM Medical Record Number: [id number] Patient Account Number: [id number] Date of Birth/Sex: 11/02/1934 (86 y.o. F) Treating RN: Cornell Barman Primary Care Provider: [name] Other Clinician: Referring Provider: [name] Treating Provider/Extender: Jeri Cos Weeks in Treatment: 0 Subjective Chief Complaint Information obtained from Patient Right knee trauma/laceration History of Present Illness (HPI) 05/16/2021 upon evaluation today patient presents today for initial evaluation here in the clinic concerning a wound that happened as a result of trauma on the left medial knee. She currently tells me that she fell and scraped this area this caused a wound that has been somewhat painful for her. Right now it has a scab covering the majority of it which is a combination again of bleeding as well as drainage and hardening of the tissue. She tells me that the majority of the skin tore off whenever she hit and what did not she trimmed off. Nonetheless she currently tells me that was about 2 weeks ago and at this point she is seeing some improvement she feels like is starting to make turn around but nonetheless still has a significant wound here that she feels like she could use to help with. She does have a history of congestive heart failure, chronic kidney disease, hypertension, diabetes, and is on anticoagulant therapy. Fortunately it does not look like we can have to have a tremendous and extensive debridement here but we do need to get the eschar off of the surface of the wound to allow this to heal. Patient History Information obtained from Patient. Allergies saxagliptin,  epinephrine, atorvastatin, codeine, ezetimibe, limonene, nitrofurantoin, Sulfa (Sulfonamide Antibiotics) Social History Former smoker, Marital Status - Married, Alcohol Use - Never, Drug Use - No History, Caffeine Use - Daily. Medical History Eyes Patient has history of Cataracts - removed Respiratory Patient has history of Chronic Obstructive Pulmonary Disease (COPD) Cardiovascular Patient has history of Angina, Congestive Heart Failure, Coronary Artery Disease, Hypertension, Myocardial Infarction, Peripheral Arterial Disease Endocrine Patient has history of Type II Diabetes Musculoskeletal Patient has history of Osteoarthritis Neurologic Patient has history of Dementia, Neuropathy - feets Oncologic Denies history of Received Chemotherapy, Received Radiation Patient is treated with Insulin. Medical And Surgical History Notes Eyes Macular degeneration Genitourinary CKD Neurologic TIA Review of Systems (ROS) Constitutional Symptoms (General Health) Denies complaints or symptoms of Fatigue, Fever, Chills,  Marked Weight Change. Eyes Complains or has symptoms of Vision Changes. Ear/Nose/Mouth/Throat Denies complaints or symptoms of Difficult clearing ears, Sinusitis. Hematologic/Lymphatic Denies complaints or symptoms of Bleeding / Clotting Disorders, Human Immunodeficiency Virus. Cardiovascular Complains or has symptoms of LE edema. Gastrointestinal Denies complaints or symptoms of Frequent diarrhea, Nausea, Vomiting. Immunological D[place]ge, [place] ([id number]) Denies complaints or symptoms of Hives, Itching. Integumentary (Skin) Complains or has symptoms of Wounds, Bleeding or bruising tendency. Oncologic Skin Cancer on neck Psychiatric Denies complaints or symptoms of Anxiety, Claustrophobia. Objective Constitutional patient is hypertensive.. pulse regular and within target range for patient.Marland Kitchen respirations regular, non-labored and within target range for  patient.Marland Kitchen temperature within target range for patient.. Well-nourished and well-hydrated in no acute distress. Vitals Time Taken: 8:41 AM, Height: 60 in, Weight: 120 lbs, BMI: 23.4, Temperature: 97.9 F, Pulse: 76 bpm, Respiratory Rate: 16 breaths/min, Blood Pressure: 157/68 mmHg. Eyes conjunctiva clear no eyelid edema noted. pupils equal round and reactive to light and accommodation. Ears, Nose, Mouth, and Throat no gross abnormality of ear auricles or external auditory canals. normal hearing noted during conversation. mucus membranes moist. Respiratory normal breathing without difficulty. Cardiovascular 2+ dorsalis pedis/posterior tibialis pulses. no clubbing, cyanosis, significant edema, Musculoskeletal normal gait and posture. no significant deformity or arthritic changes, no loss or range of motion, no clubbing. Psychiatric this patient is able to make decisions and demonstrates good insight into disease process. Alert and Oriented x 3. pleasant and cooperative. General Notes: Upon inspection patient's wound bed actually showed signs of being eschar covered initially. Actually perform sharp debridement to clear some of this eschar off in fact I was able to get everything off carefully she did not have any extensive drainage or bleeding and postdebridement the wound bed appears to be doing significantly better which is great news. I do believe that she would benefit from a dressing to keep this from drying out I really think collagen would be the ideal thing right now and will cover this with a border foam dressing. If that becomes too dry we may even consider Xeroform we will see how this goes. Integumentary (Hair, Skin) Wound #1 status is Open. Original cause of wound was Trauma. The date acquired was: 05/02/2021. The wound is located on the Left,Medial Knee. The wound measures 3cm length x 3.5cm width x 0.1cm depth; 8.247cm^2 area and 0.825cm^3 volume. There is no tunneling or  undermining noted. There is a medium amount of serous drainage noted. There is no granulation within the wound bed. There is a large (67-100%) amount of necrotic tissue within the wound bed including Eschar. Assessment Active Problems ICD-10 Laceration without foreign body, left knee, initial encounter Non-pressure chronic ulcer of other part of left lower leg with other specified severity Chronic combined systolic (congestive) and diastolic (congestive) heart failure Chronic kidney disease, unspecified Essential (primary) hypertension Type 2 diabetes mellitus with other skin ulcer Long term (current) use of anticoagulants Jill Hudson, Jill C. ([id number]) Procedures Wound #1 Pre-procedure diagnosis of Wound #1 is a Trauma, Other located on the Left,Medial Knee . There was a Excisional Skin/Subcutaneous Tissue Debridement with a total area of 10.5 sq cm performed by Tommie Sams., PA-C. With the following instrument(s): Curette to remove Viable and Non-Viable tissue/material. Material removed includes Eschar, Subcutaneous Tissue, and Slough. No specimens were taken. A time out was conducted at 09:25, prior to the start of the procedure. A Moderate amount of bleeding was controlled with Pressure. The procedure was tolerated well. Post Debridement Measurements: 3cm length x  3.5cm width x 0.2cm depth; 1.649cm^3 volume. Character of Wound/Ulcer Post Debridement is stable. Post procedure Diagnosis Wound #1: Same as Pre-Procedure Plan Follow-up Appointments: Return Appointment in 1 week. Bathing/ Shower/ Hygiene: Wash wounds with antibacterial soap and water. May shower; gently cleanse wound with antibacterial soap, rinse and pat dry prior to dressing wounds No tub bath. Edema Control - Lymphedema / Segmental Compressive Device / Other: Elevate, Exercise Daily and Avoid Standing for Long Periods of Time. Elevate legs to the level of the heart and pump ankles as often as possible Elevate leg(s)  parallel to the floor when sitting. Additional Orders / Instructions: Follow Nutritious Diet and Increase Protein Intake WOUND #1: - Knee Wound Laterality: Left, Medial Primary Dressing: Xeroform-HBD 2x2 (in/in) (DME) (Generic) 1 x Per Day/15 Days Discharge Instructions: Apply Xeroform-HBD 2x2 (in/in) as directed Secondary Dressing: [place] Dressing, 4x4 (in/in) 1 x Per Day/15 Days Discharge Instructions: Apply over dressing to secure in place. 1. After further discussion I did recommend that we actually go ahead and initiate treatment with a Xeroform gauze dressing which I think will do quite well we will also prevent anything from sticking to keep the area quite moist. The patient is in agreement with the plan. 2. Also can recommend a Telfa island dressing to cover. 3. I would also suggest that the patient continue to monitor for any signs of worsening or infection if anything occurs she should let me know but I am hopeful again that she will have good improvement of this wound over the next several weeks. We will see patient back for reevaluation in 1 week here in the clinic. If anything worsens or changes patient will contact our office for additional recommendations. Electronic Signature(s) Signed: 05/16/2021 6:42:01 PM By: Worthy Keeler PA-C Entered By: Worthy Keeler on 05/16/2021 18:42:01 Jill Hudson, Jill Hudson ([id number]) -------------------------------------------------------------------------------- ROS/PFSH Details Patient Name: Jill Hudson Date of Service: 05/16/2021 8:15 AM Medical Record Number: [id number] Patient Account Number: [id number] Date of Birth/Sex: 1934-11-04 (86 y.o. F) Treating RN: Cornell Barman Primary Care Provider: [name] Other Clinician: Referring Provider: [name] Treating Provider/Extender: Jeri Cos Weeks in Treatment: 0 Information Obtained From Patient Constitutional Symptoms (General Health) Complaints and  Symptoms: Negative for: Fatigue; Fever; Chills; Marked Weight Change Eyes Complaints and Symptoms: Positive for: Vision Changes Medical History: Positive for: Cataracts - removed Past Medical History Notes: Macular degeneration Ear/Nose/Mouth/Throat Complaints and Symptoms: Negative for: Difficult clearing ears; Sinusitis Hematologic/Lymphatic Complaints and Symptoms: Negative for: Bleeding / Clotting Disorders; Human Immunodeficiency Virus Cardiovascular Complaints and Symptoms: Positive for: LE edema Medical History: Positive for: Angina; Congestive Heart Failure; Coronary Artery Disease; Hypertension; Myocardial Infarction; Peripheral Arterial Disease Gastrointestinal Complaints and Symptoms: Negative for: Frequent diarrhea; Nausea; Vomiting Immunological Complaints and Symptoms: Negative for: Hives; Itching Integumentary (Skin) Complaints and Symptoms: Positive for: Wounds; Bleeding or bruising tendency Psychiatric Complaints and Symptoms: Negative for: Anxiety; Claustrophobia Respiratory Nuttall, Camiah C. ([id number]) Medical History: Positive for: Chronic Obstructive Pulmonary Disease (COPD) Endocrine Medical History: Positive for: Type II Diabetes Time with diabetes: 40 years Treated with: Insulin Genitourinary Medical History: Past Medical History Notes: CKD Musculoskeletal Medical History: Positive for: Osteoarthritis Neurologic Medical History: Positive for: Dementia; Neuropathy - feets Past Medical History Notes: TIA Oncologic Complaints and Symptoms: Review of System Notes: Skin Cancer on neck Medical History: Negative for: Received Chemotherapy; Received Radiation HBO Extended History Items Eyes: Cataracts Immunizations Pneumococcal Vaccine: Received Pneumococcal Vaccination: Yes Tetanus Vaccine: Last tetanus shot: 05/02/2021 Implantable Devices None Family and Social  History Former smoker; Marital Status - Married; Alcohol Use: Never;  Drug Use: No History; Caffeine Use: Daily Electronic Signature(s) Signed: 05/16/2021 9:49:13 AM By: Gretta Cool, BSN, RN, CWS, Kim RN, BSN Signed: 05/16/2021 6:45:55 PM By: Worthy Keeler PA-C Entered By: Gretta Cool BSN, RN, CWS, Kim on 05/16/2021 08:51:18 TASHAWNA, Jill Hudson ([id number]) -------------------------------------------------------------------------------- SuperBill Details Patient Name: Jill Hudson Date of Service: 05/16/2021 Medical Record Number: [id number] Patient Account Number: [id number] Date of Birth/Sex: Sep 20, 1935 (86 y.o. F) Treating RN: Cornell Barman Primary Care Provider: [name] Other Clinician: Referring Provider: [name] Treating Provider/Extender: Jeri Cos Weeks in Treatment: 0 Diagnosis Coding ICD-10 Codes Code Description [phone] Laceration without foreign body, left knee, initial encounter L97.828 Non-pressure chronic ulcer of other part of left lower leg with other specified severity I50.42 Chronic combined systolic (congestive) and diastolic (congestive) heart failure N18.9 Chronic kidney disease, unspecified I10 Essential (primary) hypertension E11.622 Type 2 diabetes mellitus with other skin ulcer Z79.01 Long term (current) use of anticoagulants Facility Procedures CPT4 Code: [id number] Description: 99213 - WOUND CARE VISIT-LEV 3 EST PT Modifier: Quantity: 1 CPT4 Code: [id number] Description: 61443 - DEB SUBQ TISSUE 20 SQ CM/< Modifier: Quantity: 1 CPT4 Code: Description: ICD-10 Diagnosis Description L97.828 Non-pressure chronic ulcer of other part of left lower leg with other speci Modifier: fied severity Quantity: Physician Procedures CPT4 Code: 1540086 Description: 11042 - WC PHYS SUBQ TISS 20 SQ CM Modifier: Quantity: 1 CPT4 Code: Description: ICD-10 Diagnosis Description L97.828 Non-pressure chronic ulcer of other part of left lower leg with other spec Modifier: ified severity Quantity: Electronic Signature(s) Signed: 05/16/2021  6:42:25 PM By: Worthy Keeler PA-C Entered By: Worthy Keeler on 05/16/2021 18:42:25

## 2021-05-20 DIAGNOSIS — S81809A Unspecified open wound, unspecified lower leg, initial encounter: Secondary | ICD-10-CM | POA: Diagnosis not present

## 2021-05-23 ENCOUNTER — Other Ambulatory Visit: Payer: Self-pay

## 2021-05-23 ENCOUNTER — Encounter: Payer: Medicare Other | Admitting: Physician Assistant

## 2021-05-23 DIAGNOSIS — E1122 Type 2 diabetes mellitus with diabetic chronic kidney disease: Secondary | ICD-10-CM | POA: Diagnosis not present

## 2021-05-23 DIAGNOSIS — I5042 Chronic combined systolic (congestive) and diastolic (congestive) heart failure: Secondary | ICD-10-CM | POA: Diagnosis not present

## 2021-05-23 DIAGNOSIS — E11622 Type 2 diabetes mellitus with other skin ulcer: Secondary | ICD-10-CM | POA: Diagnosis not present

## 2021-05-23 DIAGNOSIS — E1151 Type 2 diabetes mellitus with diabetic peripheral angiopathy without gangrene: Secondary | ICD-10-CM | POA: Diagnosis not present

## 2021-05-23 DIAGNOSIS — I130 Hypertensive heart and chronic kidney disease with heart failure and stage 1 through stage 4 chronic kidney disease, or unspecified chronic kidney disease: Secondary | ICD-10-CM | POA: Diagnosis not present

## 2021-05-23 DIAGNOSIS — E1140 Type 2 diabetes mellitus with diabetic neuropathy, unspecified: Secondary | ICD-10-CM | POA: Diagnosis not present

## 2021-05-23 DIAGNOSIS — L97828 Non-pressure chronic ulcer of other part of left lower leg with other specified severity: Secondary | ICD-10-CM | POA: Diagnosis not present

## 2021-05-23 DIAGNOSIS — N189 Chronic kidney disease, unspecified: Secondary | ICD-10-CM | POA: Diagnosis not present

## 2021-05-23 DIAGNOSIS — Z7901 Long term (current) use of anticoagulants: Secondary | ICD-10-CM | POA: Diagnosis not present

## 2021-05-23 NOTE — Progress Notes (Signed)
SHALLON, Hudson ([id number]) Visit Report for 05/23/2021 Arrival Information Details Patient Name: Jill Hudson, Jill Hudson Date of Service: 05/23/2021 9:30 AM Medical Record Number: [id number] Patient Account Number: [id number] Date of Birth/Sex: May 29, 1935 (86 y.o. F) Treating RN: Jill Hudson Primary Care Jill Hudson: Jill Hudson Other Clinician: Referring Jill Hudson: Jill Hudson Treating Jill Hudson/Extender: Jill Hudson in Treatment: 1 Visit Information History Since Last Visit Added or deleted any medications: No Patient Arrived: Ambulatory Had a fall or experienced change in No Arrival Time: 09:35 activities of daily living that may affect Accompanied By: daughter risk of falls: Transfer Assistance: None Hospitalized since last visit: No Patient Identification Verified: Yes Has Dressing in Place as Prescribed: Yes Patient Has Alerts: Yes Pain Present Now: No Patient Alerts: Patient on Blood Thinner Eliquis Type 2 Diabetic Electronic Signature(s) Signed: 05/23/2021 1:22:28 PM By: Jill Hudson Entered By: Jill Hudson on 05/23/2021 09:38:00 Hudson, Jill Lore ([id number]) -------------------------------------------------------------------------------- Clinic Level of Care Assessment Details Patient Name: Jill Hudson Date of Service: 05/23/2021 9:30 AM Medical Record Number: [id number] Patient Account Number: [id number] Date of Birth/Sex: 06-17-1935 (86 y.o. F) Treating RN: Jill Hudson Primary Care Jill Hudson: Jill Hudson Other Clinician: Referring Jill Hudson: Jill Hudson Treating Havyn Ramo/Extender: Jill Hudson in Treatment: 1 Clinic Level of Care Assessment Items TOOL 4 Quantity Score '[]'  - Use when only an EandM is performed on FOLLOW-UP visit 0 ASSESSMENTS - Nursing Assessment / Reassessment '[]'  - Reassessment of Co-morbidities (includes updates in patient status) 0 '[]'  - 0 Reassessment of Adherence to Treatment Plan ASSESSMENTS - Wound and Skin Assessment /  Reassessment X - Simple Wound Assessment / Reassessment - one wound 1 5 '[]'  - 0 Complex Wound Assessment / Reassessment - multiple wounds '[]'  - 0 Dermatologic / Skin Assessment (not related to wound area) ASSESSMENTS - Focused Assessment '[]'  - Circumferential Edema Measurements - multi extremities 0 '[]'  - 0 Nutritional Assessment / Counseling / Intervention '[]'  - 0 Lower Extremity Assessment (monofilament, tuning fork, pulses) '[]'  - 0 Peripheral Arterial Disease Assessment (using hand held doppler) ASSESSMENTS - Ostomy and/or Continence Assessment and Care '[]'  - Incontinence Assessment and Management 0 '[]'  - 0 Ostomy Care Assessment and Management (repouching, etc.) PROCESS - Coordination of Care X - Simple Patient / Family Education for ongoing care 1 15 '[]'  - 0 Complex (extensive) Patient / Family Education for ongoing care '[]'  - 0 Staff obtains Programmer, systems, Records, Test Results / Process Orders '[]'  - 0 Staff telephones HHA, Nursing Homes / Clarify orders / etc '[]'  - 0 Routine Transfer to another Facility (non-emergent condition) '[]'  - 0 Routine Hospital Admission (non-emergent condition) '[]'  - 0 New Admissions / Biomedical engineer / Ordering NPWT, Apligraf, etc. '[]'  - 0 Emergency Hospital Admission (emergent condition) X- 1 10 Simple Discharge Coordination '[]'  - 0 Complex (extensive) Discharge Coordination PROCESS - Special Needs '[]'  - Pediatric / Minor Patient Management 0 '[]'  - 0 Isolation Patient Management '[]'  - 0 Hearing / Language / Visual special needs '[]'  - 0 Assessment of Community assistance (transportation, D/C planning, etc.) '[]'  - 0 Additional assistance / Altered mentation '[]'  - 0 Support Surface(s) Assessment (bed, cushion, seat, etc.) INTERVENTIONS - Wound Cleansing / Measurement Hudson, Jill C. ([id number]) X- 1 5 Simple Wound Cleansing - one wound '[]'  - 0 Complex Wound Cleansing - multiple wounds '[]'  - 0 Wound Imaging (photographs - any number of wounds) '[]'  -  0 Wound Tracing (instead of photographs) X- 1 5 Simple Wound Measurement - one wound '[]'  - 0 Complex Wound Measurement -  multiple wounds INTERVENTIONS - Wound Dressings X - Small Wound Dressing one or multiple wounds 1 10 '[]'  - 0 Medium Wound Dressing one or multiple wounds '[]'  - 0 Large Wound Dressing one or multiple wounds '[]'  - 0 Application of Medications - topical '[]'  - 0 Application of Medications - injection INTERVENTIONS - Miscellaneous '[]'  - External ear exam 0 '[]'  - 0 Specimen Collection (cultures, biopsies, blood, body fluids, etc.) '[]'  - 0 Specimen(s) / Culture(s) sent or taken to Lab for analysis '[]'  - 0 Patient Transfer (multiple staff / Jill Hudson / Similar devices) '[]'  - 0 Simple Staple / Suture removal (25 or less) '[]'  - 0 Complex Staple / Suture removal (26 or more) '[]'  - 0 Hypo / Hyperglycemic Management (close monitor of Blood Glucose) '[]'  - 0 Ankle / Brachial Index (ABI) - do not check if billed separately X- 1 5 Vital Signs Has the patient been seen at the hospital within the last three years: Yes Total Score: 55 Level Of Care: New/Established - Level 2 Electronic Signature(s) Signed: 05/23/2021 1:22:28 PM By: Jill Hudson Entered By: Jill Hudson on 05/23/2021 10:04:07 Jill Hudson ([id number]) -------------------------------------------------------------------------------- Encounter Discharge Information Details Patient Name: Jill Hudson Date of Service: 05/23/2021 9:30 AM Medical Record Number: [id number] Patient Account Number: [id number] Date of Birth/Sex: 08/31/1935 (86 y.o. F) Treating RN: Jill Hudson Primary Care Jill Hudson: Jill Hudson Other Clinician: Referring Jill Hudson: Jill Hudson Treating Khalie Wince/Extender: Jill Hudson in Treatment: 1 Encounter Discharge Information Items Discharge Condition: Stable Ambulatory Status: Cane Discharge Destination: Home Transportation: Private Auto Accompanied By: daughter Schedule Follow-up  Appointment: Yes Clinical Summary of Care: Electronic Signature(s) Signed: 05/23/2021 1:22:28 PM By: Jill Hudson Entered By: Jill Hudson on 05/23/2021 10:07:45 Jill Hudson ([id number]) -------------------------------------------------------------------------------- Lower Extremity Assessment Details Patient Name: Jill Hudson Date of Service: 05/23/2021 9:30 AM Medical Record Number: [id number] Patient Account Number: [id number] Date of Birth/Sex: 08-16-1935 (86 y.o. F) Treating RN: Jill Hudson Primary Care Fitzroy Mikami: Jill Hudson Other Clinician: Referring Mateo Overbeck: Jill Hudson Treating Kaymen Adrian/Extender: Jeri Cos Weeks in Treatment: 1 Edema Assessment Assessed: Shirlyn Goltz: Yes] [Right: No] [Left: Edema] [Right: :] Calf Left: Right: Point of Measurement: 30 cm From Medial Instep 30.5 cm Ankle Left: Right: Point of Measurement: 12 cm From Medial Instep 22.5 cm Vascular Assessment Pulses: Dorsalis Pedis Palpable: [Left:Yes] Electronic Signature(s) Signed: 05/23/2021 1:22:28 PM By: Jill Hudson Entered By: Jill Hudson on 05/23/2021 09:45:11 Cothran, Jill Lore ([id number]) -------------------------------------------------------------------------------- Multi Wound Chart Details Patient Name: Jill Hudson Date of Service: 05/23/2021 9:30 AM Medical Record Number: [id number] Patient Account Number: [id number] Date of Birth/Sex: [birth year] (86 y.o. F) Treating RN: Jill Hudson Primary Care Rheannon Cerney: Jill Hudson Other Clinician: Referring Mackinze Criado: Jill Hudson Treating Jaida Basurto/Extender: Jeri Cos Weeks in Treatment: 1 Vital Signs Height(in): 60 Pulse(bpm): 11 Weight(lbs): 120 Blood Pressure(mmHg): 173/63 Body Mass Index(BMI): 23 Temperature(F): 97.9 Respiratory Rate(breaths/min): 16 Photos: [N/A:N/A] Wound Location: Left, Medial Knee N/A N/A Wounding Event: Trauma N/A N/A Primary Etiology: Trauma, Other N/A N/A Comorbid History: Cataracts, Chronic  Obstructive N/A N/A Pulmonary Disease (COPD), Angina, Congestive Heart Failure, Coronary Artery Disease, Hypertension, Myocardial Infarction, Peripheral Arterial Disease, Type II Diabetes, Osteoarthritis, Dementia, Neuropathy Date Acquired: 05/02/2021 N/A N/A Weeks of Treatment: 1 N/A N/A Wound Status: Open N/A N/A Measurements L x W x D (cm) 1.8x1.4x0.1 N/A N/A Area (cm) : 1.979 N/A N/A Volume (cm) : 0.198 N/A N/A % Reduction in Area: 76.00% N/A N/A % Reduction in Volume: 76.00% N/A N/A Classification: Full Thickness Without Exposed N/A N/A  Support Structures Exudate Amount: Medium N/A N/A Exudate Type: Serosanguineous N/A N/A Exudate Color: red, brown N/A N/A Granulation Amount: Medium (34-66%) N/A N/A Granulation Quality: Red, Pink N/A N/A Necrotic Amount: Medium (34-66%) N/A N/A Exposed Structures: Fascia: No N/A N/A Fat Layer (Subcutaneous Tissue): No Tendon: No Muscle: No Joint: No Bone: No Epithelialization: None N/A N/A Treatment Notes Electronic Signature(s) Signed: 05/23/2021 1:22:28 PM By: Vanice Sarah ([id number]) Entered By: Jill Hudson on 05/23/2021 09:45:57 Been, Jill Lore ([id number]) -------------------------------------------------------------------------------- Multi-Disciplinary Care Plan Details Patient Name: Jill Hudson Date of Service: 05/23/2021 9:30 AM Medical Record Number: [id number] Patient Account Number: [id number] Date of Birth/Sex: 10/02/1935 (86 y.o. F) Treating RN: Jill Hudson Primary Care Aniyla Harling: Jill Hudson Other Clinician: Referring Geddy Boydstun: Jill Hudson Treating Quentin Strebel/Extender: Jill Hudson in Treatment: 1 Active Inactive Necrotic Tissue Nursing Diagnoses: Impaired tissue integrity related to necrotic/devitalized tissue Knowledge deficit related to management of necrotic/devitalized tissue Goals: Necrotic/devitalized tissue will be minimized in the wound bed Date Initiated: 05/16/2021 Target  Resolution Date: 05/15/2021 Goal Status: Active Patient/caregiver will verbalize understanding of reason and process for debridement of necrotic tissue Date Initiated: 05/16/2021 Target Resolution Date: 05/15/2021 Goal Status: Active Interventions: Assess patient pain level pre-, during and post procedure and prior to discharge Provide education on necrotic tissue and debridement process Treatment Activities: Excisional debridement : 05/16/2021 Notes: Wound/Skin Impairment Nursing Diagnoses: Impaired tissue integrity Knowledge deficit related to smoking impact on wound healing Knowledge deficit related to ulceration/compromised skin integrity Goals: Patient/caregiver will verbalize understanding of skin care regimen Date Initiated: 05/16/2021 Date Inactivated: 05/23/2021 Target Resolution Date: 05/15/2021 Goal Status: Met Ulcer/skin breakdown will have a volume reduction of 30% by week 4 Date Initiated: 05/16/2021 Target Resolution Date: 06/16/2021 Goal Status: Active Interventions: Assess ulceration(s) every visit Treatment Activities: Skin care regimen initiated : 05/16/2021 Topical wound management initiated : 05/16/2021 Notes: Electronic Signature(s) Signed: 05/23/2021 1:22:28 PM By: Jill Hudson Entered By: Jill Hudson on 05/23/2021 09:45:45 Maines, Jill Lore ([id number]) -------------------------------------------------------------------------------- Pain Assessment Details Patient Name: Jill Hudson Date of Service: 05/23/2021 9:30 AM Medical Record Number: [id number] Patient Account Number: [id number] Date of Birth/Sex: 08-12-1935 (86 y.o. F) Treating RN: Jill Hudson Primary Care Deja Kaigler: Jill Hudson Other Clinician: Referring Beckett Maden: Jill Hudson Treating Siah Steely/Extender: Jeri Cos Weeks in Treatment: 1 Active Problems Location of Pain Severity and Description of Pain Patient Has Paino No Site Locations Rate the pain. Current Pain Level: 0 Pain Management  and Medication Current Pain Management: Electronic Signature(s) Signed: 05/23/2021 1:22:28 PM By: Jill Hudson Entered By: Jill Hudson on 05/23/2021 09:40:14 Jill Hudson ([id number]) -------------------------------------------------------------------------------- Patient/Caregiver Education Details Patient Name: Jill Hudson Date of Service: 05/23/2021 9:30 AM Medical Record Number: [id number] Patient Account Number: [id number] Date of Birth/Gender: Aug 13, 1935 (86 y.o. F) Treating RN: Jill Hudson Primary Care Physician: Jill Hudson Other Clinician: Referring Physician: Halina Hudson Treating Physician/Extender: Jill Hudson in Treatment: 1 Education Assessment Education Provided To: Patient Education Topics Provided Basic Hygiene: Wound Debridement: Wound/Skin Impairment: Electronic Signature(s) Signed: 05/23/2021 1:22:28 PM By: Jill Hudson Entered By: Jill Hudson on 05/23/2021 10:04:27 Jill Hudson ([id number]) -------------------------------------------------------------------------------- Wound Assessment Details Patient Name: Jill Hudson Date of Service: 05/23/2021 9:30 AM Medical Record Number: [id number] Patient Account Number: [id number] Date of Birth/Sex: 06/01/1935 (86 y.o. F) Treating RN: Jill Hudson Primary Care Gina Leblond: Jill Hudson Other Clinician: Referring Niurka Benecke: Jill Hudson Treating Zimir Kittleson/Extender: Jeri Cos Weeks in Treatment: 1 Wound Status Wound Number: 1 Primary Trauma, Other Etiology: Wound Location: Left, Medial Knee Wound  Open Wounding Event: Trauma Status: Date Acquired: 05/02/2021 Comorbid Cataracts, Chronic Obstructive Pulmonary Disease (COPD), Weeks Of Treatment: 1 History: Angina, Congestive Heart Failure, Coronary Artery Disease, Clustered Wound: No Hypertension, Myocardial Infarction, Peripheral Arterial Disease, Type II Diabetes, Osteoarthritis, Dementia, Neuropathy Photos Wound Measurements Length: (cm)  1.8 Width: (cm) 1.4 Depth: (cm) 0.1 Area: (cm) 1.979 Volume: (cm) 0.198 % Reduction in Area: 76% % Reduction in Volume: 76% Epithelialization: None Tunneling: No Undermining: No Wound Description Classification: Full Thickness Without Exposed Support Structu Exudate Amount: Medium Exudate Type: Serosanguineous Exudate Color: red, brown res Foul Odor After Cleansing: No Slough/Fibrino Yes Wound Bed Granulation Amount: Medium (34-66%) Exposed Structure Granulation Quality: Red, Pink Fascia Exposed: No Necrotic Amount: Medium (34-66%) Fat Layer (Subcutaneous Tissue) Exposed: No Necrotic Quality: Adherent Slough Tendon Exposed: No Muscle Exposed: No Joint Exposed: No Bone Exposed: No Treatment Notes Wound #1 (Knee) Wound Laterality: Left, Medial Cleanser Peri-Wound Care Topical Garbutt, Yuleni C. ([id number]) Primary Dressing Xeroform-HBD 2x2 (in/in) Discharge Instruction: Apply Xeroform-HBD 2x2 (in/in) as directed Secondary Dressing [place] Dressing, 4x4 (in/in) Discharge Instruction: Apply over dressing to secure in place. Secured With Compression Wrap Compression Stockings Add-Ons Electronic Signature(s) Signed: 05/23/2021 1:22:28 PM By: Jill Hudson Entered By: Jill Hudson on 05/23/2021 09:44:03 Stickle, Jill Lore ([id number]) -------------------------------------------------------------------------------- Vitals Details Patient Name: Jill Hudson Date of Service: 05/23/2021 9:30 AM Medical Record Number: [id number] Patient Account Number: [id number] Date of Birth/Sex: [birth year] (86 y.o. F) Treating RN: Jill Hudson Primary Care Jalee Saine: Jill Hudson Other Clinician: Referring Jamez Ambrocio: Jill Hudson Treating Pierra Skora/Extender: Jeri Cos Weeks in Treatment: 1 Vital Signs Time Taken: 09:37 Temperature (F): 97.9 Height (in): 60 Pulse (bpm): 73 Weight (lbs): 120 Respiratory Rate (breaths/min): 16 Body Mass Index (BMI): 23.4 Blood Pressure  (mmHg): 173/63 Reference Range: 80 - 120 mg / dl Electronic Signature(s) Signed: 05/23/2021 1:22:28 PM By: Jill Hudson Entered ByDonnamarie Hudson on 05/23/2021 09:40:02

## 2021-05-23 NOTE — Progress Notes (Addendum)
Jill Hudson, Jill Hudson ([id number]) Visit Report for 05/23/2021 Chief Complaint Document Details Patient Name: ARIBELLA, VAVRA Date of Service: 05/23/2021 9:30 AM Medical Record Number: [id number] Patient Account Number: [id number] Date of Birth/Sex: July 21, 1935 (86 y.o. F) Treating RN: Donnamarie Poag Primary Care Provider: [name] Other Clinician: Referring Provider: [name] Treating Provider/Extender: Jeri Cos Weeks in Treatment: 1 Information Obtained from: Patient Chief Complaint Right knee trauma/laceration Electronic Signature(s) Signed: 05/23/2021 10:00:49 AM By: Worthy Keeler PA-C Entered By: Worthy Keeler on 05/23/2021 10:00:49 Jill Hudson, Jill Hudson ([id number]) -------------------------------------------------------------------------------- HPI Details Patient Name: Jill Hudson Date of Service: 05/23/2021 9:30 AM Medical Record Number: [id number] Patient Account Number: [id number] Date of Birth/Sex: May 26, 1935 (86 y.o. F) Treating RN: Donnamarie Poag Primary Care Provider: [name] Other Clinician: Referring Provider: [name] Treating Provider/Extender: Jeri Cos Weeks in Treatment: 1 History of Present Illness HPI Description: 05/16/2021 upon evaluation today patient presents today for initial evaluation here in the clinic concerning a wound that happened as a result of trauma on the left medial knee. She currently tells me that she fell and scraped this area this caused a wound that has been somewhat painful for her. Right now it has a scab covering the majority of it which is a combination again of bleeding as well as drainage and hardening of the tissue. She tells me that the majority of the skin tore off whenever she hit and what did not she trimmed off. Nonetheless she currently tells me that was about 2 weeks ago and at this point she is seeing some improvement she feels like is starting to make turn around but nonetheless still has a significant wound  here that she feels like she could use to help with. She does have a history of congestive heart failure, chronic kidney disease, hypertension, diabetes, and is on anticoagulant therapy. Fortunately it does not look like we can have to have a tremendous and extensive debridement here but we do need to get the eschar off of the surface of the wound to allow this to heal. 05/23/2021 upon evaluation today patient appears to be doing well with regard to her wound. In fact this is showing signs of excellent improvement which is great news. There is no evidence of active infection at this time which is also great news. No fevers, chills, nausea, vomiting, or diarrhea. Electronic Signature(s) Signed: 05/23/2021 10:22:06 AM By: Worthy Keeler PA-C Entered By: Worthy Keeler on 05/23/2021 10:22:06 Jill Hudson, Jill Hudson ([id number]) -------------------------------------------------------------------------------- Physical Exam Details Patient Name: Jill Hudson Date of Service: 05/23/2021 9:30 AM Medical Record Number: [id number] Patient Account Number: [id number] Date of Birth/Sex: 1934/12/22 (86 y.o. F) Treating RN: Donnamarie Poag Primary Care Provider: [name] Other Clinician: Referring Provider: [name] Treating Provider/Extender: Jeri Cos Weeks in Treatment: 1 Constitutional Well-nourished and well-hydrated in no acute distress. Respiratory normal breathing without difficulty. Psychiatric this patient is able to make decisions and demonstrates good insight into disease process. Alert and Oriented x 3. pleasant and cooperative. Notes Upon inspection patient's wound bed showed signs of good granulation epithelization at this point. There does not appear to be any evidence of infection which is great and overall I am extremely pleased with where things stand today. No fevers, chills, nausea, vomiting, or diarrhea. Electronic Signature(s) Signed: 05/23/2021 10:22:21 AM By: Worthy Keeler  PA-C Entered By: Worthy Keeler on 05/23/2021 10:22:20 Jill Hudson, Jill Hudson ([id number]) -------------------------------------------------------------------------------- Physician Orders Details Patient Name: Jill Hudson Date of Service: 05/23/2021 9:30  AM Medical Record Number: [id number] Patient Account Number: [id number] Date of Birth/Sex: 1935/08/01 (86 y.o. F) Treating RN: Donnamarie Poag Primary Care Provider: [name] Other Clinician: Referring Provider: [name] Treating Provider/Extender: Skipper Cliche in Treatment: 1 Verbal / Phone Orders: No Diagnosis Coding ICD-10 Coding Code Description [phone] Laceration without foreign body, left knee, initial encounter L97.828 Non-pressure chronic ulcer of other part of left lower leg with other specified severity I50.42 Chronic combined systolic (congestive) and diastolic (congestive) heart failure N18.9 Chronic kidney disease, unspecified I10 Essential (primary) hypertension E11.622 Type 2 diabetes mellitus with other skin ulcer Z79.01 Long term (current) use of anticoagulants Follow-up Appointments o Return Appointment in 1 week. Bathing/ Shower/ Hygiene o Wash wounds with antibacterial soap and water. o May shower; gently cleanse wound with antibacterial soap, rinse and pat dry prior to dressing wounds o No tub bath. Edema Control - Lymphedema / Segmental Compressive Device / Other o Elevate, Exercise Daily and Avoid Standing for Long Periods of Time. o Elevate legs to the level of the heart and pump ankles as often as possible o Elevate leg(s) parallel to the floor when sitting. Additional Orders / Instructions o Follow Nutritious Diet and Increase Protein Intake Wound Treatment Wound #1 - Knee Wound Laterality: Left, Medial Primary Dressing: Xeroform-HBD 2x2 (in/in) (Generic) 1 x Per Day/15 Days Discharge Instructions: Apply Xeroform-HBD 2x2 (in/in) as directed Secondary Dressing: [place]elfa Adhesive  Island Dressing, 4x4 (in/in) 1 x Per Day/15 Days Discharge Instructions: Apply over dressing to secure in place. Electronic Signature(s) Signed: 05/23/2021 1:22:28 PM By: Donnamarie Poag Signed: 05/23/2021 4:21:57 PM By: Worthy Keeler PA-C Entered By: Donnamarie Poag on 05/23/2021 10:03:19 Jill Hudson ([id number]) -------------------------------------------------------------------------------- Problem List Details Patient Name: Jill Hudson Date of Service: 05/23/2021 9:30 AM Medical Record Number: [id number] Patient Account Number: [id number] Date of Birth/Sex: 1935/02/09 (85 y.o. F) Treating RN: Donnamarie Poag Primary Care Provider: [name] Other Clinician: Referring Provider: [name] Treating Provider/Extender: Skipper Cliche in Treatment: 1 Active Problems ICD-10 Encounter Code Description Active Date MDM Diagnosis S81.012A Laceration without foreign body, left knee, initial encounter 05/16/2021 No Yes L97.828 Non-pressure chronic ulcer of other part of left lower leg with other 05/16/2021 No Yes specified severity I50.42 Chronic combined systolic (congestive) and diastolic (congestive) heart 05/16/2021 No Yes failure N18.9 Chronic kidney disease, unspecified 05/16/2021 No Yes I10 Essential (primary) hypertension 05/16/2021 No Yes E11.622 Type 2 diabetes mellitus with other skin ulcer 05/16/2021 No Yes Z79.01 Long term (current) use of anticoagulants 05/16/2021 No Yes Inactive Problems Resolved Problems Electronic Signature(s) Signed: 05/23/2021 10:00:44 AM By: Worthy Keeler PA-C Entered By: Worthy Keeler on 05/23/2021 10:00:44 Jill Hudson, Jill Hudson ([id number]) -------------------------------------------------------------------------------- Progress Note Details Patient Name: Jill Hudson Date of Service: 05/23/2021 9:30 AM Medical Record Number: [id number] Patient Account Number: [id number] Date of Birth/Sex: [birth year] (86 y.o. F) Treating RN: Donnamarie Poag Primary  Care Provider: [name] Other Clinician: Referring Provider: [name] Treating Provider/Extender: Skipper Cliche in Treatment: 1 Subjective Chief Complaint Information obtained from Patient Right knee trauma/laceration History of Present Illness (HPI) 05/16/2021 upon evaluation today patient presents today for initial evaluation here in the clinic concerning a wound that happened as a result of trauma on the left medial knee. She currently tells me that she fell and scraped this area this caused a wound that has been somewhat painful for her. Right now it has a scab covering the majority of it which is a combination again of bleeding as well as  drainage and hardening of the tissue. She tells me that the majority of the skin tore off whenever she hit and what did not she trimmed off. Nonetheless she currently tells me that was about 2 weeks ago and at this point she is seeing some improvement she feels like is starting to make turn around but nonetheless still has a significant wound here that she feels like she could use to help with. She does have a history of congestive heart failure, chronic kidney disease, hypertension, diabetes, and is on anticoagulant therapy. Fortunately it does not look like we can have to have a tremendous and extensive debridement here but we do need to get the eschar off of the surface of the wound to allow this to heal. 05/23/2021 upon evaluation today patient appears to be doing well with regard to her wound. In fact this is showing signs of excellent improvement which is great news. There is no evidence of active infection at this time which is also great news. No fevers, chills, nausea, vomiting, or diarrhea. Objective Constitutional Well-nourished and well-hydrated in no acute distress. Vitals Time Taken: 9:37 AM, Height: 60 in, Weight: 120 lbs, BMI: 23.4, Temperature: 97.9 F, Pulse: 73 bpm, Respiratory Rate: 16 breaths/min, Blood Pressure:  173/63 mmHg. Respiratory normal breathing without difficulty. Psychiatric this patient is able to make decisions and demonstrates good insight into disease process. Alert and Oriented x 3. pleasant and cooperative. General Notes: Upon inspection patient's wound bed showed signs of good granulation epithelization at this point. There does not appear to be any evidence of infection which is great and overall I am extremely pleased with where things stand today. No fevers, chills, nausea, vomiting, or diarrhea. Integumentary (Hair, Skin) Wound #1 status is Open. Original cause of wound was Trauma. The date acquired was: 05/02/2021. The wound has been in treatment 1 weeks. The wound is located on the Left,Medial Knee. The wound measures 1.8cm length x 1.4cm width x 0.1cm depth; 1.979cm^2 area and 0.198cm^3 volume. There is no tunneling or undermining noted. There is a medium amount of serosanguineous drainage noted. There is medium (34-66%) red, pink granulation within the wound bed. There is a medium (34-66%) amount of necrotic tissue within the wound bed including Adherent Slough. Assessment Active Problems ICD-10 Laceration without foreign body, left knee, initial encounter Jill Hudson, Jill C. ([id number]) Non-pressure chronic ulcer of other part of left lower leg with other specified severity Chronic combined systolic (congestive) and diastolic (congestive) heart failure Chronic kidney disease, unspecified Essential (primary) hypertension Type 2 diabetes mellitus with other skin ulcer Long term (current) use of anticoagulants Plan Follow-up Appointments: Return Appointment in 1 week. Bathing/ Shower/ Hygiene: Wash wounds with antibacterial soap and water. May shower; gently cleanse wound with antibacterial soap, rinse and pat dry prior to dressing wounds No tub bath. Edema Control - Lymphedema / Segmental Compressive Device / Other: Elevate, Exercise Daily and Avoid Standing for Long  Periods of Time. Elevate legs to the level of the heart and pump ankles as often as possible Elevate leg(s) parallel to the floor when sitting. Additional Orders / Instructions: Follow Nutritious Diet and Increase Protein Intake WOUND #1: - Knee Wound Laterality: Left, Medial Primary Dressing: Xeroform-HBD 2x2 (in/in) (Generic) 1 x Per Day/15 Days Discharge Instructions: Apply Xeroform-HBD 2x2 (in/in) as directed Secondary Dressing: [place] Dressing, 4x4 (in/in) 1 x Per Day/15 Days Discharge Instructions: Apply over dressing to secure in place. 1. Would recommend that we going continue with the wound care measures  as before specifically with regard to the Xeroform I think this is doing a great job. 2. I am also can recommend that we continue with a Telfa island dressing to cover. 3. I am also can recommend the patient continue to monitor for any signs of worsening from the standpoint of infection in general and if she has any issues she should let me know otherwise we will plan to see her back for follow-up in 2 weeks. We will see patient back for reevaluation in 2 weeks here in the clinic. If anything worsens or changes patient will contact our office for additional recommendations. We are doing 2 weeks being that there were no openings next week and I think this patient is actually doing extremely well so I saw no need to force into the schedule. Electronic Signature(s) Signed: 05/23/2021 10:23:02 AM By: Worthy Keeler PA-C Entered By: Worthy Keeler on 05/23/2021 10:23:02 Jill Hudson, Jill Hudson ([id number]) -------------------------------------------------------------------------------- SuperBill Details Patient Name: Jill Hudson Date of Service: 05/23/2021 Medical Record Number: [id number] Patient Account Number: [id number] Date of Birth/Sex: 30-Oct-1934 (86 y.o. F) Treating RN: Donnamarie Poag Primary Care Provider: [name] Other Clinician: Referring Provider: [name] Treating Provider/Extender: Jeri Cos Weeks in Treatment: 1 Diagnosis Coding ICD-10 Codes Code Description [phone] Laceration without foreign body, left knee, initial encounter L97.828 Non-pressure chronic ulcer of other part of left lower leg with other specified severity I50.42 Chronic combined systolic (congestive) and diastolic (congestive) heart failure N18.9 Chronic kidney disease, unspecified I10 Essential (primary) hypertension E11.622 Type 2 diabetes mellitus with other skin ulcer Z79.01 Long term (current) use of anticoagulants Facility Procedures CPT4 Code: [id number] Description: [phone] - WOUND CARE VISIT-LEV 2 EST PT Modifier: Quantity: 1 Physician Procedures CPT4 Code: 1594585 Description: 99213 - WC PHYS LEVEL 3 - EST PT Modifier: Quantity: 1 CPT4 Code: Description: ICD-10 Diagnosis Description S81.012A Laceration without foreign body, left knee, initial encounter L97.828 Non-pressure chronic ulcer of other part of left lower leg with other spe I50.42 Chronic combined systolic (congestive) and diastolic  (congestive) heart f N18.9 Chronic kidney disease, unspecified Modifier: cified severity ailure Quantity: Electronic Signature(s) Signed: 05/23/2021 10:23:13 AM By: Worthy Keeler PA-C Entered By: Worthy Keeler on 05/23/2021 10:23:13

## 2021-05-26 DIAGNOSIS — H353211 Exudative age-related macular degeneration, right eye, with active choroidal neovascularization: Secondary | ICD-10-CM | POA: Diagnosis not present

## 2021-05-29 ENCOUNTER — Telehealth: Payer: Self-pay

## 2021-05-29 NOTE — Chronic Care Management (AMB) (Signed)
Chronic Care Management Pharmacy Assistant   Name: Jill Hudson  MRN: [id number] DOB: Aug 24, 1935  Jill Hudson is an 86 y.o. year old female who presents for herfollow-up CCM visit with the clinical pharmacist.  Reason for Encounter: General Adherence   Recent office visits:  05/09/21 [name] MD(PCP)- Patient was seen for Cellulitis of the left lower extremity. Referral was placed to wound care and patient was started on short course Amox 875-125 mg tab BID.  04/21/21 [name] MD- Patient was seen for edema. Patients Torsemide was adjusted to 20 MG in the morning then back to regular schedule. Follow up in 2 weeks. Follow up with cardiology in July.  04/11/21 [name] MD- Patient was seen for routine mammogram  Recent consult visits:  05/23/21 [name], PA-C- Patient was seen for wound care of the left medial knee that occurred from a fall. Follow up in 1 week  05/16/21 [name], PA-C- Patient was seen for trauma to the knee. Debridement was performed on left knee. Follow up in 1 week.   05/13/21 [name] PA- Patient was seen for Chronic Heart Failure. Follow up in 3 months.  05/05/21 [name]- Patient was seen for Diabetes. Patient had debridement x10 and follow up in 6 months.  Hospital visits:  Medication Reconciliation was completed by comparing discharge summary, patient's EMR and Pharmacy list, and upon discussion with patient.  Admitted to the hospital on 05/02/21 due to a fall. Discharge date was 05/02/21. Discharged from [facility].  New?Medications Started at [facility] Discharge:?? -started Hydrocodone-Acetaminophen 5-325 MG and Ondansetron 4 MG due to pain and nausea. Patient also received her tetanus shot.  Medication Changes at Hospital Discharge: -Changed none  Medications Discontinued at Hospital Discharge: -Stopped none   Medications that remain the same after Hospital Discharge:??  -All  other medications will remain the same.    Medications: Outpatient Encounter Medications as of 05/29/2021  Medication Sig Note   acetaminophen (TYLENOL) 650 MG CR tablet Take 1,300 mg by mouth every 8 (eight) hours as needed for pain.    albuterol (VENTOLIN HFA) 108 (90 Base) MCG/ACT inhaler Inhale 2 puffs into the lungs every 6 (six) hours as needed for wheezing or shortness of breath.    amLODipine (NORVASC) 5 MG tablet Take 5 mg by mouth in the morning and at bedtime.    B-D ULTRAFINE III SHORT PEN 31G X 8 MM MISC USE AS DIRECTED    cloNIDine (CATAPRES) 0.2 MG tablet Take 1 tablet (0.2 mg total) by mouth 2 (two) times daily. Please check blood pressure prior to taking medication.  Only take if blood pressure is greater than 782 systolic. (Patient taking differently: Take 0.2 mg by mouth as needed. Please check blood pressure prior to taking medication.  Only take if blood pressure is greater than 423 systolic.)    Cyanocobalamin 1000 MCG TBCR Take 1,000 mcg by mouth daily.    ELIQUIS 2.5 MG TABS tablet TAKE 1 TABLET(2.5 MG) BY MOUTH TWICE DAILY    feeding supplement, GLUCERNA SHAKE, (GLUCERNA SHAKE) LIQD Take 237 mLs by mouth 3 (three) times daily between meals.    ferrous sulfate 325 (65 FE) MG tablet Take 325 mg by mouth daily with breakfast.    gabapentin (NEURONTIN) 100 MG capsule Take 1 capsule (100 mg total) by mouth at bedtime.    HYDROcodone-acetaminophen (NORCO/VICODIN) 5-325 MG tablet Take 1 tablet by mouth every 6 (six) hours as  needed.    insulin aspart (NOVOLOG FLEXPEN) 100 UNIT/ML FlexPen Inject 3 Units into the skin 3 (three) times daily with meals. At lunch (Patient taking differently: Inject 20 Units into the skin daily. At breakfast- sliding scale) 02/25/2021: 4 units tid AC meals  (increase by 1 unit if BG>250)   Insulin Glargine (BASAGLAR KWIKPEN) 100 UNIT/ML INJECT 12 UNITS UNDER THE SKIN DAILY 02/25/2021: Taking 5 units q am    losartan (COZAAR) 25 MG tablet Take 25 mg by mouth  2 (two) times daily. Unsure of dosage    melatonin 5 MG TABS Take 0.5 tablets (2.5 mg total) by mouth at bedtime as needed (sleep).    Multiple Vitamins-Minerals (PRESERVISION AREDS PO) Take 1 capsule by mouth 2 (two) times daily.     ondansetron (ZOFRAN ODT) 4 MG disintegrating tablet Take 1 tablet (4 mg total) by mouth every 6 (six) hours as needed for nausea or vomiting.    promethazine-dextromethorphan (PROMETHAZINE-DM) 6.25-15 MG/5ML syrup TAKE 5 ML BY MOUTH FOUR TIMES DAILY AS NEEDED FOR COUGH    rosuvastatin (CRESTOR) 5 MG tablet Take 5 mg by mouth every other day. Mon, Wed, Fri and Sat.    torsemide (DEMADEX) 20 MG tablet Take 2 tablets (40 mg total) by mouth daily. (Patient taking differently: Take 40 mg by mouth 2 (two) times daily. 1/2 tablet morning and 1/2 tablet at night.)    No facility-administered encounter medications on file as of 05/29/2021.   Have you had any problems recently with your health? Patient daughter stated she had a bad fall a few weeks ago but her wound is doing great since she's been going to Wound Care. Patient daughter stated she may be released from wound care next Friday.  Have you had any problems with your pharmacy? Patient daughter stated that she is not having any problems with her pharmacy at this time.   What issues or side effects are you having with your medications? Patients daughter stated that she is unaware of any side effects.  What would you like me to pass along to [facility] for them to help you with?  Patient daughter voiced concern about getting a refills for her mothers Eliquis. She mentioned that PCP mentioned Repatha.   What can we do to take care of you better?  Patient would like assistance getting refill for Eliquis and possibly some assistance w/ paying for moms Eliquis and Repatha.  Star Rating Drugs: Losartan 25 MG last filled 05/27/21 90 DS Rosuvastatin last filled 05/05/21 84 DS  [place] Clinical Pharmacist  Assistant [phone]

## 2021-06-03 ENCOUNTER — Ambulatory Visit (INDEPENDENT_AMBULATORY_CARE_PROVIDER_SITE_OTHER): Payer: Medicare Other | Admitting: General Practice

## 2021-06-03 DIAGNOSIS — N1831 Chronic kidney disease, stage 3a: Secondary | ICD-10-CM

## 2021-06-03 DIAGNOSIS — I10 Essential (primary) hypertension: Secondary | ICD-10-CM

## 2021-06-03 DIAGNOSIS — I5032 Chronic diastolic (congestive) heart failure: Secondary | ICD-10-CM

## 2021-06-03 DIAGNOSIS — E1122 Type 2 diabetes mellitus with diabetic chronic kidney disease: Secondary | ICD-10-CM

## 2021-06-03 DIAGNOSIS — S81002A Unspecified open wound, left knee, initial encounter: Secondary | ICD-10-CM

## 2021-06-03 DIAGNOSIS — W19XXXA Unspecified fall, initial encounter: Secondary | ICD-10-CM

## 2021-06-03 NOTE — Chronic Care Management (AMB) (Signed)
Chronic Care Management   CCM RN Visit Note  06/03/2021 Name: Jill Hudson MRN: [id number] DOB: 1935-03-27  Subjective: Jill Hudson is a 86 y.o. year old female who is a primary care patient of [name], MD. The care management team was consulted for assistance with disease management and care coordination needs.    Engaged with patient by telephone for follow up visit in response to provider referral for case management and/or care coordination services.   Consent to Services:  The patient was given information about Chronic Care Management services, agreed to services, and gave verbal consent prior to initiation of services.  Please see initial visit note for detailed documentation.   Patient agreed to services and verbal consent obtained.   Assessment: Review of patient past medical history, allergies, medications, health status, including review of consultants reports, laboratory and other test data, was performed as part of comprehensive evaluation and provision of chronic care management services.   SDOH (Social Determinants of Health) assessments and interventions performed:    CCM Care Plan  Allergies  Allergen Reactions   Saxagliptin Diarrhea   Epinephrine Other (See Comments)    Patient does not remember what happens when she uses this   Atorvastatin Other (See Comments)    Muscle aches   Codeine Other (See Comments)    Upset stomach   Ezetimibe Other (See Comments)    Myalgias(ZETIA)   Limonene Rash    Patient does not recall this reaction   Nitrofurantoin Rash and Other (See Comments)    Pruitus   Sulfa Antibiotics Rash and Other (See Comments)    Sore mouth     Outpatient Encounter Medications as of 06/03/2021  Medication Sig Note   acetaminophen (TYLENOL) 650 MG CR tablet Take 1,300 mg by mouth every 8 (eight) hours as needed for pain.    albuterol (VENTOLIN HFA) 108 (90 Base) MCG/ACT inhaler Inhale 2 puffs into the lungs every 6 (six) hours as needed  for wheezing or shortness of breath.    amLODipine (NORVASC) 5 MG tablet Take 5 mg by mouth in the morning and at bedtime.    B-D ULTRAFINE III SHORT PEN 31G X 8 MM MISC USE AS DIRECTED    cloNIDine (CATAPRES) 0.2 MG tablet Take 1 tablet (0.2 mg total) by mouth 2 (two) times daily. Please check blood pressure prior to taking medication.  Only take if blood pressure is greater than 761 systolic. (Patient taking differently: Take 0.2 mg by mouth as needed. Please check blood pressure prior to taking medication.  Only take if blood pressure is greater than 607 systolic.)    Cyanocobalamin 1000 MCG TBCR Take 1,000 mcg by mouth daily.    ELIQUIS 2.5 MG TABS tablet TAKE 1 TABLET(2.5 MG) BY MOUTH TWICE DAILY    feeding supplement, GLUCERNA SHAKE, (GLUCERNA SHAKE) LIQD Take 237 mLs by mouth 3 (three) times daily between meals.    ferrous sulfate 325 (65 FE) MG tablet Take 325 mg by mouth daily with breakfast.    gabapentin (NEURONTIN) 100 MG capsule Take 1 capsule (100 mg total) by mouth at bedtime.    HYDROcodone-acetaminophen (NORCO/VICODIN) 5-325 MG tablet Take 1 tablet by mouth every 6 (six) hours as needed.    insulin aspart (NOVOLOG FLEXPEN) 100 UNIT/ML FlexPen Inject 3 Units into the skin 3 (three) times daily with meals. At lunch (Patient taking differently: Inject 20 Units into the skin daily. At breakfast- sliding scale) 02/25/2021: 4 units tid AC meals  (increase  by 1 unit if BG>250)   Insulin Glargine (BASAGLAR KWIKPEN) 100 UNIT/ML INJECT 12 UNITS UNDER THE SKIN DAILY 02/25/2021: Taking 5 units q am    losartan (COZAAR) 25 MG tablet Take 25 mg by mouth 2 (two) times daily. Unsure of dosage    melatonin 5 MG TABS Take 0.5 tablets (2.5 mg total) by mouth at bedtime as needed (sleep).    Multiple Vitamins-Minerals (PRESERVISION AREDS PO) Take 1 capsule by mouth 2 (two) times daily.     ondansetron (ZOFRAN ODT) 4 MG disintegrating tablet Take 1 tablet (4 mg total) by mouth every 6 (six) hours as needed  for nausea or vomiting.    promethazine-dextromethorphan (PROMETHAZINE-DM) 6.25-15 MG/5ML syrup TAKE 5 ML BY MOUTH FOUR TIMES DAILY AS NEEDED FOR COUGH    rosuvastatin (CRESTOR) 5 MG tablet Take 5 mg by mouth every other day. Mon, Wed, Fri and Sat.    torsemide (DEMADEX) 20 MG tablet Take 2 tablets (40 mg total) by mouth daily. (Patient taking differently: Take 40 mg by mouth 2 (two) times daily. 1/2 tablet morning and 1/2 tablet at night.)    No facility-administered encounter medications on file as of 06/03/2021.    Patient Active Problem List   Diagnosis Date Noted   Acquired thrombophilia ([place]) 03/20/2021   Mood disorder ([place]) 05/02/2020   Chronic heart failure with preserved ejection fraction ([place]) 04/12/2020   Diverticulosis of large intestine without perforation or abscess with bleeding 03/27/2020   AF (paroxysmal atrial fibrillation) ([place])    Thrush    COPD (chronic obstructive pulmonary disease) with chronic bronchitis ([place])    CKD (chronic kidney disease), stage IIIa 02/18/2020   Malnutrition of mild degree ([place]) 01/08/2020   Age-related macular degeneration, dry, left eye 06/21/2019   Age-related macular degeneration, wet, right eye ([place]) 06/21/2019   Moderate nonproliferative diabetic retinopathy associated with type 2 diabetes mellitus ([place]) 06/21/2019   Lumbosacral radiculopathy at L4 05/01/2019   Underweight 05/01/2019   Encounter for long-term (current) use of aspirin 10/31/2018   Encounter for long-term (current) use of antiplatelets/antithrombotics 10/31/2018   Long term current use of oral hypoglycemic drug 10/31/2018   Encounter for long-term (current) use of insulin ([place]) 10/31/2018   GIB (gastrointestinal bleeding) 08/11/2018   Leg pain 07/03/2017   Carpal tunnel syndrome on both sides 05/05/2017   Myalgia due to HMG CoA reductase inhibitor 05/05/2017   History of CVA (cerebrovascular accident) 02/15/2017   Degenerative disc disease, lumbar 12/21/2016    Atherosclerosis of native arteries of extremity with intermittent claudication ([place]) 12/20/2016   Bilateral carotid artery stenosis 12/20/2016   Occlusion and stenosis of bilateral carotid arteries 12/20/2016   Hip bursitis 05/18/2016   Elevated TSH 01/18/2016   CKD stage 3 due to type 2 diabetes mellitus ([place]) 01/16/2016   Senile ecchymosis 01/16/2016   Type II diabetes mellitus with renal manifestations ([place]) 09/16/2015   GERD (gastroesophageal reflux disease) 07/24/2015   PVD (peripheral vascular disease) ([place]) 05/17/2015   Neoplasm of uncertain behavior of skin 05/17/2015   Retinopathy, diabetic, proliferative ([place]) 04/11/2015   Hyperlipidemia associated with type 2 diabetes mellitus ([place]) 04/11/2015   Essential hypertension 04/11/2015   Generalized OA 04/11/2015   Proliferative diabetic retinopathy(362.02) 04/11/2015   Arteriosclerosis of coronary artery 05/29/2013   Hypertensive heart disease without CHF 05/29/2013    Conditions to be addressed/monitored:CHF, HTN, DMII, and Falls with injury  Care Plan : RNCM: Heart Failure (Adult)  Updates made by [name] since 06/03/2021 12:00 AM  Problem: RNCM: Symptom Exacerbation (Heart Failure)   Priority: Medium     Long-Range Goal: RNCM: HF Symptom Exacerbation Prevented or Minimized   Start Date: 04/15/2021  Expected End Date: 04/15/2022  This Visit's Progress: On track  Priority: Medium  Note:   Current Barriers:  Knowledge deficits related to basic heart failure pathophysiology and self care management Does not contact provider office for questions/concerns Lack of scale in home Financial strain Nurse Case Manager Clinical Goal(s):  patient will weigh self daily and record patient will verbalize understanding of Heart Failure Action Plan and when to call doctor patient will take all Heart Failure mediations as prescribed Wt Readings from Last 3 Encounters:  05/09/21 120 lb (54.4 kg)  05/01/21 123 lb (55.8 kg)   04/21/21 123 lb (55.8 kg)    Interventions:  Collaboration with [name], MD regarding development and update of comprehensive plan of care as evidenced by provider attestation and co-signature Inter-disciplinary care team collaboration (see longitudinal plan of care) Basic overview and discussion of pathophysiology of Heart Failure Provided written and verbal education on low sodium diet. 06-03-2021: The patient states that she does not eat a lot but is compliant with a heart healthy/ADA diet. Denies any issues with dietary restrictions.  Reviewed Heart Failure Action Plan in depth and provided written copy Assessed for scales in home- 06-03-2021: The patient weighs daily. States her weight is 118-120. She knows to call her cardiologist if weight is over 120 due to risk of exacerbation of HF Discussed importance of daily weight Reviewed role of diuretics in prevention of fluid overload Self-Care Activities:  Takes Heart Failure Medications as prescribed. States compliance with medications.  Weighs daily and record (notifying MD of 3 lb weight gain over night or 5 lb in a week). 06-03-2021: States her weright today was 118. Usual weight is 115 to 120 Verbalizes understanding of and follows CHF Action Plan. Denies any edema or swelling. Has not had hospitalization for HF >6 months. Knows what to look for for sx and sx of exacerbation. Consistent with daily weights.  Adheres to low sodium diet. Patient states she does not have an appetite but she monitors her weight and is eating best as she can.  Patient Goals:  - Take Heart Failure Medications as prescribed - Weigh daily and record (notify MD with 3 lb weight gain over night or 5 lb in a week) - Follow CHF Action Plan - Adhere to low sodium diet - develop a rescue plan - eat more whole grains, fruits and vegetables, lean meats and healthy fats - follow rescue plan if symptoms flare-up - know when to call the doctor - track symptoms and  what helps feel better or worse - dress right for the weather, hot or cold - avoid heavy exercise on very hot days - drink water to stay hydrated during exercise - follow activity or exercise plan - make an activity or exercise plan - pace activity allowing for rest - warm up and cool down for 10 minutes before and after exercise - call office if I gain more than 2 pounds in one day or 5 pounds in one week - keep legs up while sitting - track weight in diary - use salt in moderation - watch for swelling in feet, ankles and legs every day - weigh myself daily - barriers to lifestyle changes reviewed and addressed - barriers to treatment reviewed and addressed - cognitive screening completed and reviewed - depression screen reviewed -  health literacy screening completed or reviewed - healthy lifestyle promoted - medication-adherence assessment completed - rescue (action) plan developed - rescue (action) plan reviewed - self-awareness of signs/symptoms of worsening disease encouraged Follow Up Plan: Telephone follow up appointment with care management team member scheduled for: next quarter     Task: RNCM: Identify and Minimize Risk of Heart Failure Exacerbation Completed 06/03/2021  Outcome: Positive  Note:   Care Management Activities:    - barriers to lifestyle changes reviewed and addressed - barriers to treatment reviewed and addressed - cognitive screening completed and reviewed - depression screen reviewed - health literacy screening completed or reviewed - healthy lifestyle promoted - medication-adherence assessment completed - rescue (action) plan developed - rescue (action) plan reviewed - self-awareness of signs/symptoms of worsening disease encouraged        Care Plan : RNCM: Diabetes Type 2 (Adult)  Updates made by [name] since 06/03/2021 12:00 AM     Problem: RNCM: Glycemic Management (Diabetes, Type 2)   Priority: Medium     Long-Range Goal: RNCM:  Glycemic Management Optimized   Start Date: 04/15/2021  Expected End Date: 04/15/2022  This Visit's Progress: On track  Priority: Medium  Note:   Objective:  Lab Results  Component Value Date   HGBA1C 8.8 02/10/2021    Lab Results  Component Value Date   CREATININE 1.65 (H) 12/25/2020   CREATININE 1.42 (H) 07/20/2020   CREATININE 1.57 (H) 05/02/2020   Lab Results  Component Value Date   EGFR 30 (L) 12/25/2020   Current Barriers:  Knowledge Deficits related to basic Diabetes pathophysiology and self care/management Knowledge Deficits related to medications used for management of diabetes Unable to independently manage DM Does not contact provider office for questions/concerns Case Manager Clinical Goal(s):  patient will demonstrate improved adherence to prescribed treatment plan for diabetes self care/management as evidenced by: daily monitoring and recording of CBG  adherence to ADA/ carb modified diet exercise 4/5 days/week adherence to prescribed medication regimen contacting provider for new or worsened symptoms or questions Interventions:  Collaboration with [name], MD regarding development and update of comprehensive plan of care as evidenced by provider attestation and co-signature Inter-disciplinary care team collaboration (see longitudinal plan of care) Provided education to patient about basic DM disease process Reviewed medications with patient and discussed importance of medication adherence. 06-03-2021: The patient endorses compliance with medications.  Discussed plans with patient for ongoing care management follow up and provided patient with direct contact information for care management team Provided patient with written educational materials related to hypo and hyperglycemia and importance of correct treatment. 06-03-2021: The patient denies any low blood sugars at this time. States she is doing well with her DM.  Advised patient, providing education and  rationale, to check cbg BID and record, calling pcp/endocrinologist  for findings outside established parameters.  06-03-2021: Range is 100-125.  Review of patient status, including review of consultants reports, relevant laboratory and other test results, and medications completed. Self-Care Activities - Self administers oral medications as prescribed Attends all scheduled provider appointments Checks blood sugars as prescribed and utilize hyper and hypoglycemia protocol as needed Adheres to prescribed ADA/carb modified Patient Goals: - check blood sugar at prescribed times - check blood sugar before and after exercise - check blood sugar if I feel it is too high or too low - enter blood sugar readings and medication or insulin into daily log - take the blood sugar log to all doctor visits -  change to whole grain breads, cereal, pasta - set goal weight - drink 6 to 8 glasses of water each day - fill half of plate with vegetables - limit fast food meals to no more than 1 per week - manage portion size - prepare main meal at home 3 to 5 days each week - read food labels for fat, fiber, carbohydrates and portion size - reduce red meat to 2 to 3 times a week - schedule appointment with eye doctor - check feet daily for cuts, sores or redness - keep feet up while sitting - trim toenails straight across - wash and dry feet carefully every day - wear comfortable, cotton socks - wear comfortable, well-fitting shoes - barriers to adherence to treatment plan identified - blood glucose monitoring encouraged - blood glucose readings reviewed - mutual A1C goal set or reviewed - resources required to improve adherence to care identified - self-awareness of signs/symptoms of hypo or hyperglycemia encouraged - use of blood glucose monitoring log promoted Follow Up Plan: Telephone follow up appointment with care management team member scheduled for: next quarter    Task: RNCM: Alleviate Barriers  to Glycemic Management Completed 06/03/2021  Outcome: Positive  Note:   Care Management Activities:    - barriers to adherence to treatment plan identified - blood glucose monitoring encouraged - blood glucose readings reviewed - mutual A1C goal set or reviewed - resources required to improve adherence to care identified - self-awareness of signs/symptoms of hypo or hyperglycemia encouraged - use of blood glucose monitoring log promoted        Care Plan : RNCM: Hypertension (Adult)  Updates made by [name] since 06/03/2021 12:00 AM     Problem: RNCM: Hypertension (Hypertension)   Priority: Medium     Long-Range Goal: RNCM: Hypertension Monitored   Start Date: 04/15/2021  Expected End Date: 04/15/2022  This Visit's Progress: On track  Priority: Medium  Note:   Objective:  Last practice recorded BP readings:  BP Readings from Last 3 Encounters:  05/09/21 140/72  05/02/21 (!) 151/87  04/21/21 (!) 124/40    Most recent eGFR/CrCl:  Lab Results  Component Value Date   EGFR 30 (L) 12/25/2020    No components found for: CRCL Current Barriers:  Knowledge Deficits related to basic understanding of hypertension pathophysiology and self care management Knowledge Deficits related to understanding of medications prescribed for management of hypertension Unable to independently manage HTN Does not contact provider office for questions/concerns Case Manager Clinical Goal(s):  patient will verbalize understanding of plan for hypertension management patient will demonstrate improved adherence to prescribed treatment plan for hypertension as evidenced by taking all medications as prescribed, monitoring and recording blood pressure as directed, adhering to low sodium/DASH diet patient will demonstrate improved health management independence as evidenced by checking blood pressure as directed and notifying PCP if SBP>160 or DBP > 90, taking all medications as prescribe, and adhering to  a low sodium diet as discussed. patient will verbalize basic understanding of hypertension disease process and self health management plan as evidenced by compliance with medications, compliance with heart healthy/ADA diet and working with the CCM team to optimize health and well being.  Interventions:  Collaboration with [name], MD regarding development and update of comprehensive plan of care as evidenced by provider attestation and co-signature Inter-disciplinary care team collaboration (see longitudinal plan of care) Evaluation of current treatment plan related to hypertension self management and patient's adherence to plan as established by provider.  06-03-2021: The patient states she is doing better with her blood pressures since her fall. Denies any elevated blood pressures. States when she is stressed her blood pressure goes up.  Provided education to patient re: stroke prevention, s/s of heart attack and stroke, DASH diet, complications of uncontrolled blood pressure Reviewed medications with patient and discussed importance of compliance. 06-03-2021: The patient is compliant with medications. The patient states that the cardiologist wanted to change one of her medications but she is waiting on approval from her insurance. The patient is actively working with the pharm D.  Discussed plans with patient for ongoing care management follow up and provided patient with direct contact information for care management team Advised patient, providing education and rationale, to monitor blood pressure daily and record, calling PCP for findings outside established parameters.  Self-Care Activities: - Self administers medications as prescribed Attends all scheduled provider appointments Calls provider office for new concerns, questions, or BP outside discussed parameters Checks BP and records as discussed Follows a low sodium diet/DASH diet Patient Goals: - check blood pressure weekly - choose  a place to take my blood pressure (home, clinic or office, retail store) - write blood pressure results in a log or diary - agree on reward when goals are met - agree to work together to make changes - ask questions to understand - have a family meeting to talk about healthy habits - learn about high blood pressure - blood pressure trends reviewed - depression screen reviewed - home or ambulatory blood pressure monitoring encouraged Follow Up Plan: Telephone follow up appointment with care management team member scheduled for: next quarter     Task: RNCM: Identify and Monitor Blood Pressure Elevation Completed 06/03/2021  Outcome: Positive  Note:   Care Management Activities:    - blood pressure trends reviewed - depression screen reviewed - home or ambulatory blood pressure monitoring encouraged        Care Plan : RNCM: Fall Risk (Adult)  Updates made by [name] since 06/03/2021 12:00 AM     Problem: RNCM: Fall Risk   Priority: High  Onset Date: 05/02/2021     Long-Range Goal: RNCM: Absence of Fall and Fall-Related Injury   Start Date: 06/03/2021  Expected End Date: 06/03/2022  This Visit's Progress: On track  Priority: High  Note:   Current Barriers:  Knowledge Deficits related to fall precautions in patient with  Decreased adherence to prescribed treatment for fall prevention Unable to independently manage falls as evidence of fall on 05-02-2021 with injury to left knee, evaluated in the ER and follow up with the pcp on 05-09-2021 with referral to wound clinic Unable to perform IADLs independently Does not contact provider office for questions/concerns High fall risk Knowledge Deficits related to fall prevention and safety in the home and surroundings  Chronic Disease Management support and education needs related to patient who is at hight risk for falls with multiple chronic conditions Lacks caregiver support.  Spouse is bedridden and requires 24/7 care in the  home Clinical Goal(s):  patient will demonstrate improved adherence to prescribed treatment plan for decreasing falls as evidenced by patient reporting and review of EMR patient will verbalize using fall risk reduction strategies discussed patient will not experience additional falls patient will verbalize understanding of plan for fall prevention and safety in the home and using DME when ambulating to decrease the risk of falls  patient will work with [facility] and pcp to address needs related to chronic conditions  impacting patients care and high risk for falls in the patient with limited mobility patient will demonstrate a decrease in fall exacerbations patient will take all medications exactly as prescribed and will call provider for medication related questions patient will demonstrate improved health management independence patient will demonstrate understanding of rationale for each prescribed medication the patient will demonstrate ongoing self health care management ability Interventions:  Collaboration with [name], MD regarding development and update of comprehensive plan of care as evidenced by provider attestation and co-signature Inter-disciplinary care team collaboration (see longitudinal plan of care) Provided written and verbal education re: Potential causes of falls and Fall prevention strategies Reviewed medications and discussed potential side effects of medications such as dizziness and frequent urination Assessed for s/s of orthostatic hypotension. 06-03-2021: Denies issues with orthostatic hypotension Assessed for falls since last encounter. 06-03-2021: The patient denies any new falls. Last recorded fall was 05-02-2021. The patient was evaluated in the ER. Saw pcp on 05-09-2021 and was referred to the wound clinic. The patient has a follow up appointment on 06-06-2021 with wound clinic MD.  Assessed patients knowledge of fall risk prevention secondary to previously provided  education. 06-03-2021: Reviewed with the patient the benefits of using DME and preventing falls in her environment. Education on head injuries and fractures likely with continued falls. The patient verbalized she is using her DME and also holding on to things in the home. She states she fell on her left side after getting her feet tangled in a rug.  Assessed working status of life alert bracelet and patient adherence Provided patient information for fall alert systems Evaluation of current treatment plan related to falls and safety and patient's adherence to plan as established by provider. Advised patient to call the office for any new falls or injuries, or seek emergent care  Provided education to patient re: preventing falls and being mindful of surroundings, use of DME, monitoring for changes in blood pressures, and remaining safe in her environment. Discussed plans with patient for ongoing care management follow up and provided patient with direct contact information for care management team Self-Care Deficits:  Unable to independently manage falls and safety as evidence of fall on 05-02-2021 with injury Lacks social connections Unable to perform IADLs independently Patient Goals:  - Utilize cane or walker (assistive device) appropriately with all ambulation, patient also has a wheelchair - De-clutter walkways - Change positions slowly - Wear secure fitting shoes at all times with ambulation - Utilize home lighting for dim lit areas - Demonstrate self and pet awareness at all times Follow Up Plan: Telephone follow up appointment with care management team member scheduled for: next quarter    Task: Identify and Manage Contributors to Fall Risk Completed 06/03/2021  Outcome: Positive  Note:   Care Management Activities:    - activities of daily living skills assessed - assistive or adaptive device use encouraged - barriers to physical activity or exercise addressed - barriers to physical  activity or exercise identified - barriers to safety identified - cognition assessed - cognitive-stimulating activities promoted - fall prevention plan reviewed and updated - fear of falling, loss of independence and pain acknowledged - incontinence managed - medication list reviewed - modification of home and work environment promoted - participation in rehabilitation therapy encouraged - vision and/or hearing aid use promoted    Notes:      Plan:Telephone follow up appointment with care management team member scheduled for:  next quarter    Noreene Larsson  RN, MSN, [facility] Mobile: [phone]

## 2021-06-03 NOTE — Patient Instructions (Signed)
Visit Information  PATIENT GOALS:  Goals Addressed             This Visit's Progress    RNCM: Prevent Falls and Injury       Follow Up Date: October 2022   - add more outdoor lighting - always use handrails on the stairs - always wear low-heeled or flat shoes or slippers with nonskid soles - call the doctor if I am feeling too drowsy - install bathroom grab bars - join an exercise group in my community - keep a flashlight by the bed - keep my cell phone with me always - learn how to get back up if I fall - make an emergency alert plan in case I fall - pick up clutter from the floors - use a nonslip pad with throw rugs, or remove them completely - use a cane or walker - use a nightlight in the bathroom - wear my glasses and/or hearing aid    Why is this important?   Most falls happen when it is hard for you to walk safely. Your balance may be off because of an illness. You may have pain in your knees, hip or other joints.  You may be overly tired or taking medicines that make you sleepy. You may not be able to see or hear clearly.  Falls can lead to broken bones, bruises or other injuries.  There are things you can do to help prevent falling.     Notes: 06-03-2021: The patient had a fall on 05-02-2021 with injury to left knee. The patient did not have any fractures. Saw the pcp on 05-09-2021 for infected wound to left knee. Was referred to the wound clinic. Has a follow up on 06-06-2021 with wound clinic. Review with the patient safety precautions and using DME when ambulating. The patient has a walker, a cane, and a wheelchair. Her daughter Judson Roch is with her most of the time. Education and support given.         Patient verbalizes understanding of instructions provided today and agrees to view in [place].   Telephone follow up appointment with care management team member scheduled for: next quarter   Noreene Larsson RN, MSN, [facility] Mobile: [phone]

## 2021-06-06 ENCOUNTER — Other Ambulatory Visit: Payer: Self-pay

## 2021-06-06 ENCOUNTER — Encounter: Payer: Medicare Other | Attending: Physician Assistant | Admitting: Physician Assistant

## 2021-06-06 DIAGNOSIS — E1140 Type 2 diabetes mellitus with diabetic neuropathy, unspecified: Secondary | ICD-10-CM | POA: Insufficient documentation

## 2021-06-06 DIAGNOSIS — F0390 Unspecified dementia without behavioral disturbance: Secondary | ICD-10-CM | POA: Insufficient documentation

## 2021-06-06 DIAGNOSIS — I130 Hypertensive heart and chronic kidney disease with heart failure and stage 1 through stage 4 chronic kidney disease, or unspecified chronic kidney disease: Secondary | ICD-10-CM | POA: Insufficient documentation

## 2021-06-06 DIAGNOSIS — I5042 Chronic combined systolic (congestive) and diastolic (congestive) heart failure: Secondary | ICD-10-CM | POA: Insufficient documentation

## 2021-06-06 DIAGNOSIS — I2510 Atherosclerotic heart disease of native coronary artery without angina pectoris: Secondary | ICD-10-CM | POA: Insufficient documentation

## 2021-06-06 DIAGNOSIS — Z7901 Long term (current) use of anticoagulants: Secondary | ICD-10-CM | POA: Diagnosis not present

## 2021-06-06 DIAGNOSIS — N189 Chronic kidney disease, unspecified: Secondary | ICD-10-CM | POA: Insufficient documentation

## 2021-06-06 DIAGNOSIS — E1151 Type 2 diabetes mellitus with diabetic peripheral angiopathy without gangrene: Secondary | ICD-10-CM | POA: Insufficient documentation

## 2021-06-06 DIAGNOSIS — I252 Old myocardial infarction: Secondary | ICD-10-CM | POA: Insufficient documentation

## 2021-06-06 DIAGNOSIS — X58XXXA Exposure to other specified factors, initial encounter: Secondary | ICD-10-CM | POA: Insufficient documentation

## 2021-06-06 DIAGNOSIS — L97828 Non-pressure chronic ulcer of other part of left lower leg with other specified severity: Secondary | ICD-10-CM | POA: Insufficient documentation

## 2021-06-06 DIAGNOSIS — E1136 Type 2 diabetes mellitus with diabetic cataract: Secondary | ICD-10-CM | POA: Diagnosis not present

## 2021-06-06 DIAGNOSIS — E1122 Type 2 diabetes mellitus with diabetic chronic kidney disease: Secondary | ICD-10-CM | POA: Diagnosis not present

## 2021-06-06 DIAGNOSIS — S81012A Laceration without foreign body, left knee, initial encounter: Secondary | ICD-10-CM | POA: Diagnosis not present

## 2021-06-06 DIAGNOSIS — J449 Chronic obstructive pulmonary disease, unspecified: Secondary | ICD-10-CM | POA: Insufficient documentation

## 2021-06-06 DIAGNOSIS — E11622 Type 2 diabetes mellitus with other skin ulcer: Secondary | ICD-10-CM | POA: Diagnosis not present

## 2021-06-06 NOTE — Progress Notes (Signed)
Jill, Hudson ([id number]) Visit Report for 06/06/2021 Arrival Information Details Patient Name: Jill Hudson, Jill Hudson Date of Service: 06/06/2021 12:30 PM Medical Record Number: [id number] Patient Account Number: [id number] Date of Birth/Sex: 1935/02/26 (86 y.o. F) Treating RN: Donnamarie Poag Primary Care Matvey Llanas: [name] Other Clinician: Referring Aarin Sparkman: [name] Treating Lattie Cervi/Extender: Skipper Cliche in Treatment: 3 Visit Information History Since Last Visit Added or deleted any medications: No Patient Arrived: Kasandra Knudsen Had a fall or experienced change in No Arrival Time: 12:38 activities of daily living that may affect Accompanied By: daughter risk of falls: Transfer Assistance: None Hospitalized since last visit: No Patient Identification Verified: Yes Has Dressing in Place as Prescribed: No Secondary Verification Process Completed: Yes Pain Present Now: Yes Patient Has Alerts: Yes Patient Alerts: Patient on Blood Thinner Eliquis Type 2 Diabetic Electronic Signature(s) Signed: 06/06/2021 1:43:26 PM By: Donnamarie Poag Entered By: Donnamarie Poag on 06/06/2021 12:39:53 Welcome, Jill Hudson ([id number]) -------------------------------------------------------------------------------- Clinic Level of Care Assessment Details Patient Name: Jill Hudson Date of Service: 06/06/2021 12:30 PM Medical Record Number: [id number] Patient Account Number: [id number] Date of Birth/Sex: August 20, 1935 (86 y.o. F) Treating RN: Donnamarie Poag Primary Care Tobie Hellen: [name] Other Clinician: Referring Alhaji Mcneal: [name] Treating Uziah Sorter/Extender: Skipper Cliche in Treatment: 3 Clinic Level of Care Assessment Items TOOL 4 Quantity Score []  - Use when only an EandM is performed on FOLLOW-UP visit 0 ASSESSMENTS - Nursing Assessment / Reassessment []  - Reassessment of Co-morbidities (includes updates in patient status) 0 X- 1 5 Reassessment of Adherence to Treatment  Plan ASSESSMENTS - Wound and Skin Assessment / Reassessment X - Simple Wound Assessment / Reassessment - one wound 1 5 []  - 0 Complex Wound Assessment / Reassessment - multiple wounds []  - 0 Dermatologic / Skin Assessment (not related to wound area) ASSESSMENTS - Focused Assessment X - Circumferential Edema Measurements - multi extremities 1 5 []  - 0 Nutritional Assessment / Counseling / Intervention []  - 0 Lower Extremity Assessment (monofilament, tuning fork, pulses) []  - 0 Peripheral Arterial Disease Assessment (using hand held doppler) ASSESSMENTS - Ostomy and/or Continence Assessment and Care []  - Incontinence Assessment and Management 0 []  - 0 Ostomy Care Assessment and Management (repouching, etc.) PROCESS - Coordination of Care X - Simple Patient / Family Education for ongoing care 1 15 []  - 0 Complex (extensive) Patient / Family Education for ongoing care []  - 0 Staff obtains Programmer, systems, Records, Test Results / Process Orders []  - 0 Staff telephones HHA, Nursing Homes / Clarify orders / etc []  - 0 Routine Transfer to another Facility (non-emergent condition) []  - 0 Routine Hospital Admission (non-emergent condition) []  - 0 New Admissions / Biomedical engineer / Ordering NPWT, Apligraf, etc. []  - 0 Emergency Hospital Admission (emergent condition) X- 1 10 Simple Discharge Coordination []  - 0 Complex (extensive) Discharge Coordination PROCESS - Special Needs []  - Pediatric / Minor Patient Management 0 []  - 0 Isolation Patient Management []  - 0 Hearing / Language / Visual special needs []  - 0 Assessment of Community assistance (transportation, D/C planning, etc.) []  - 0 Additional assistance / Altered mentation []  - 0 Support Surface(s) Assessment (bed, cushion, seat, etc.) INTERVENTIONS - Wound Cleansing / Measurement Kimura, Mikell C. ([id number]) X- 1 5 Simple Wound Cleansing - one wound []  - 0 Complex Wound Cleansing - multiple wounds X- 1 5 Wound  Imaging (photographs - any number of wounds) []  - 0 Wound Tracing (instead of photographs) X- 1 5 Simple Wound Measurement - one wound []  -  0 Complex Wound Measurement - multiple wounds INTERVENTIONS - Wound Dressings []  - Small Wound Dressing one or multiple wounds 0 []  - 0 Medium Wound Dressing one or multiple wounds []  - 0 Large Wound Dressing one or multiple wounds []  - 0 Application of Medications - topical []  - 0 Application of Medications - injection INTERVENTIONS - Miscellaneous []  - External ear exam 0 []  - 0 Specimen Collection (cultures, biopsies, blood, body fluids, etc.) []  - 0 Specimen(s) / Culture(s) sent or taken to Lab for analysis []  - 0 Patient Transfer (multiple staff / Civil Service fast streamer / Similar devices) []  - 0 Simple Staple / Suture removal (25 or less) []  - 0 Complex Staple / Suture removal (26 or more) []  - 0 Hypo / Hyperglycemic Management (close monitor of Blood Glucose) []  - 0 Ankle / Brachial Index (ABI) - do not check if billed separately X- 1 5 Vital Signs Has the patient been seen at the hospital within the last three years: Yes Total Score: 60 Level Of Care: New/Established - Level 2 Electronic Signature(s) Signed: 06/06/2021 1:43:26 PM By: Donnamarie Poag Entered By: Donnamarie Poag on 06/06/2021 13:09:03 Jill Hudson ([id number]) -------------------------------------------------------------------------------- Encounter Discharge Information Details Patient Name: Jill Hudson Date of Service: 06/06/2021 12:30 PM Medical Record Number: [id number] Patient Account Number: [id number] Date of Birth/Sex: 1935-05-02 (86 y.o. F) Treating RN: Donnamarie Poag Primary Care Caileen Veracruz: [name] Other Clinician: Referring Lennix Kneisel: [name] Treating Garcia Dalzell/Extender: Skipper Cliche in Treatment: 3 Encounter Discharge Information Items Discharge Condition: Stable Ambulatory Status: Cane Discharge Destination: Home Transportation: Private  Auto Accompanied By: self Schedule Follow-up Appointment: Yes Clinical Summary of Care: Electronic Signature(s) Signed: 06/06/2021 1:43:26 PM By: Donnamarie Poag Entered By: Donnamarie Poag on 06/06/2021 13:09:43 [place], Jill Hudson ([id number]) -------------------------------------------------------------------------------- Lower Extremity Assessment Details Patient Name: Jill Hudson Date of Service: 06/06/2021 12:30 PM Medical Record Number: [id number] Patient Account Number: [id number] Date of Birth/Sex: [birth year] (86 y.o. F) Treating RN: Donnamarie Poag Primary Care Chalon Zobrist: [name] Other Clinician: Referring Banner Huckaba: [name] Treating Arash Karstens/Extender: Jeri Cos Weeks in Treatment: 3 Edema Assessment Assessed: [Left: Yes] [Right: No] Edema: [Left: N] [Right: o] Calf Left: Right: Point of Measurement: From Medial Instep 30 cm Ankle Left: Right: Point of Measurement: From Medial Instep 22 cm Knee To Floor Left: Right: From Medial Instep 37 cm Vascular Assessment Pulses: Dorsalis Pedis Palpable: [Left:Yes] Electronic Signature(s) Signed: 06/06/2021 1:43:26 PM By: Donnamarie Poag Entered By: Donnamarie Poag on 06/06/2021 12:44:34 [place], Jill Hudson ([id number]) -------------------------------------------------------------------------------- Multi Wound Chart Details Patient Name: Jill Hudson Date of Service: 06/06/2021 12:30 PM Medical Record Number: [id number] Patient Account Number: [id number] Date of Birth/Sex: [birth year] (86 y.o. F) Treating RN: Donnamarie Poag Primary Care Mamoru Takeshita: [name] Other Clinician: Referring Abbygale Lapid: [name] Treating Trayvond Viets/Extender: Jeri Cos Weeks in Treatment: 3 Vital Signs Height(in): 60 Pulse(bpm): 58 Weight(lbs): 120 Blood Pressure(mmHg): 184/67 Body Mass Index(BMI): 23 Temperature(F): 97.8 Respiratory Rate(breaths/min): 16 Photos: [N/A:N/A] Wound Location: Left, Medial Knee N/A N/A Wounding Event: Trauma N/A  N/A Primary Etiology: Trauma, Other N/A N/A Comorbid History: Cataracts, Chronic Obstructive N/A N/A Pulmonary Disease (COPD), Angina, Congestive Heart Failure, Coronary Artery Disease, Hypertension, Myocardial Infarction, Peripheral Arterial Disease, Type II Diabetes, Osteoarthritis, Dementia, Neuropathy Date Acquired: 05/02/2021 N/A N/A Weeks of Treatment: 3 N/A N/A Wound Status: Open N/A N/A Measurements L x W x D (cm) 0.1x0.1x0.1 N/A N/A Area (cm) : 0.008 N/A N/A Volume (cm) : 0.001 N/A N/A % Reduction in Area: 99.90% N/A N/A % Reduction in  Volume: 99.90% N/A N/A Classification: Full Thickness Without Exposed N/A N/A Support Structures Exudate Amount: Medium N/A N/A Exudate Type: Serosanguineous N/A N/A Exudate Color: red, brown N/A N/A Granulation Amount: None Present (0%) N/A N/A Necrotic Amount: None Present (0%) N/A N/A Exposed Structures: Fascia: No N/A N/A Fat Layer (Subcutaneous Tissue): No Tendon: No Muscle: No Joint: No Bone: No Epithelialization: Large (67-100%) N/A N/A Treatment Notes Electronic Signature(s) Signed: 06/06/2021 1:43:26 PM By: Donnamarie Poag Entered By: Donnamarie Poag on 06/06/2021 12:45:04 [place], Jill Hudson ([id number] Hudson ([id number]) -------------------------------------------------------------------------------- [place] Details Patient Name: Jill Hudson Date of Service: 06/06/2021 12:30 PM Medical Record Number: [id number] Patient Account Number: [id number] Date of Birth/Sex: Jan 09, 1935 (86 y.o. F) Treating RN: Donnamarie Poag Primary Care Alaric Gladwin: [name] Other Clinician: Referring Decarlos Empey: [name] Treating Ainsley Sanguinetti/Extender: Jeri Cos Weeks in Treatment: 3 Active Inactive Electronic Signature(s) Signed: 06/06/2021 1:43:26 PM By: Donnamarie Poag Entered By: Donnamarie Poag on 06/06/2021 12:44:49 [place], Jill Hudson  ([id number]) -------------------------------------------------------------------------------- Pain Assessment Details Patient Name: Jill Hudson Date of Service: 06/06/2021 12:30 PM Medical Record Number: [id number] Patient Account Number: [id number] Date of Birth/Sex: [birth year] (86 y.o. F) Treating RN: Donnamarie Poag Primary Care Delanie Tirrell: [name] Other Clinician: Referring Amonda Brillhart: [name] Treating Daylyn Christine/Extender: Skipper Cliche in Treatment: 3 Active Problems Location of Pain Severity and Description of Pain Patient Has Paino Yes Site Locations Pain Location: Generalized Pain Rate the pain. Current Pain Level: 4 Pain Management and Medication Current Pain Management: Electronic Signature(s) Signed: 06/06/2021 1:43:26 PM By: Donnamarie Poag Entered By: Donnamarie Poag on 06/06/2021 12:40:28 Jill Hudson ([id number]) -------------------------------------------------------------------------------- Patient/Caregiver Education Details Patient Name: Jill Hudson Date of Service: 06/06/2021 12:30 PM Medical Record Number: [id number] Patient Account Number: [id number] Date of Birth/Gender: Oct 24, 1935 (86 y.o. F) Treating RN: Donnamarie Poag Primary Care Physician: [name] Other Clinician: Referring Physician: [name] Treating Physician/Extender: Skipper Cliche in Treatment: 3 Education Assessment Education Provided To: Patient and Caregiver Education Topics Provided Basic Hygiene: Wound/Skin Impairment: Electronic Signature(s) Signed: 06/06/2021 1:43:26 PM By: Donnamarie Poag Entered By: Donnamarie Poag on 06/06/2021 12:45:21 [place], Jill Hudson ([id number]) -------------------------------------------------------------------------------- Wound Assessment Details Patient Name: Jill Hudson Date of Service: 06/06/2021 12:30 PM Medical Record Number: [id number] Patient Account Number: [id number] Date of Birth/Sex: 1935/09/04 (86 y.o. F) Treating RN: Donnamarie Poag Primary Care Shellia Hartl: [name] Other Clinician: Referring Zurri Rudden: [name] Treating Bryttani Blew/Extender: Jeri Cos Weeks in Treatment: 3 Wound Status Wound Number: 1 Primary Trauma, Other Etiology: Wound Location: Left, Medial Knee Wound Healed - Epithelialized Wounding Event: Trauma Status: Date Acquired: 05/02/2021 Comorbid Cataracts, Chronic Obstructive Pulmonary Disease (COPD), Weeks Of Treatment: 3 History: Angina, Congestive Heart Failure, Coronary Artery Disease, Clustered Wound: No Hypertension, Myocardial Infarction, Peripheral Arterial Disease, Type II Diabetes, Osteoarthritis, Dementia, Neuropathy Photos Wound Measurements Length: (cm) 0 Width: (cm) 0 Depth: (cm) 0 Area: (cm) Volume: (cm) % Reduction in Area: 100% % Reduction in Volume: 100% Epithelialization: Large (67-100%) 0 Tunneling: No 0 Undermining: No Wound Description Classification: Full Thickness Without Exposed Support Structures Exudate Amount: Medium Exudate Type: Serosanguineous Exudate Color: red, brown Foul Odor After Cleansing: No Slough/Fibrino Yes Wound Bed Granulation Amount: None Present (0%) Exposed Structure Necrotic Amount: None Present (0%) Fascia Exposed: No Fat Layer (Subcutaneous Tissue) Exposed: No Tendon Exposed: No Muscle Exposed: No Joint Exposed: No Bone Exposed: No Treatment Notes Wound #1 (Knee) Wound Laterality: Left, Medial Cleanser Peri-Wound Care Topical Aguiniga, Kinsleigh C. ([id number]) Primary Dressing Secondary Dressing Secured With Compression Wrap Compression  Stockings Environmental education officer) Signed: 06/06/2021 1:43:26 PM By: Donnamarie Poag Entered By: Donnamarie Poag on 06/06/2021 13:05:45 Jill Hudson ([id number]) -------------------------------------------------------------------------------- [place] Details Patient Name: Jill Hudson Date of Service: 06/06/2021 12:30 PM Medical Record Number: [id number] Patient Account  Number: [id number] Date of Birth/Sex: October 26, 1935 (86 y.o. F) Treating RN: Donnamarie Poag Primary Care Aidenn Skellenger: [name] Other Clinician: Referring Damon Hargrove: [name] Treating Palmyra Rogacki/Extender: Jeri Cos Weeks in Treatment: 3 Vital Signs Time Taken: 12:40 Temperature (F): 97.8 Height (in): 60 Pulse (bpm): 62 Weight (lbs): 120 Respiratory Rate (breaths/min): 16 Body Mass Index (BMI): 23.4 Blood Pressure (mmHg): 184/67 Reference Range: 80 - 120 mg / dl Electronic Signature(s) Signed: 06/06/2021 1:43:26 PM By: Donnamarie Poag Entered ByDonnamarie Poag on 06/06/2021 12:40:14

## 2021-06-06 NOTE — Progress Notes (Addendum)
Jill Hudson, Jill Hudson ([id number]) Visit Report for 06/06/2021 Chief Complaint Document Details Patient Name: Jill Hudson, Jill Hudson Date of Service: 06/06/2021 12:30 PM Medical Record Number: [id number] Patient Account Number: [id number] Date of Birth/Sex: 09-Sep-1935 (86 y.o. F) Treating RN: Donnamarie Poag Primary Care Provider: [name] Other Clinician: Referring Provider: [name] Treating Provider/Extender: Skipper Cliche in Treatment: 3 Information Obtained from: Patient Chief Complaint Right knee trauma/laceration Electronic Signature(s) Signed: 06/06/2021 1:04:32 PM By: Worthy Keeler PA-C Entered By: Worthy Keeler on 06/06/2021 13:04:32 Jill Hudson, Jill Hudson ([id number]) -------------------------------------------------------------------------------- HPI Details Patient Name: Jill Hudson Date of Service: 06/06/2021 12:30 PM Medical Record Number: [id number] Patient Account Number: [id number] Date of Birth/Sex: Jul 18, 1935 (86 y.o. F) Treating RN: Donnamarie Poag Primary Care Provider: [name] Other Clinician: Referring Provider: [name] Treating Provider/Extender: Skipper Cliche in Treatment: 3 History of Present Illness HPI Description: 05/16/2021 upon evaluation today patient presents today for initial evaluation here in the clinic concerning a wound that happened as a result of trauma on the left medial knee. She currently tells me that she fell and scraped this area this caused a wound that has been somewhat painful for her. Right now it has a scab covering the majority of it which is a combination again of bleeding as well as drainage and hardening of the tissue. She tells me that the majority of the skin tore off whenever she hit and what did not she trimmed off. Nonetheless she currently tells me that was about 2 weeks ago and at this point she is seeing some improvement she feels like is starting to make turn around but nonetheless still has a significant wound  here that she feels like she could use to help with. She does have a history of congestive heart failure, chronic kidney disease, hypertension, diabetes, and is on anticoagulant therapy. Fortunately it does not look like we can have to have a tremendous and extensive debridement here but we do need to get the eschar off of the surface of the wound to allow this to heal. 05/23/2021 upon evaluation today patient appears to be doing well with regard to her wound. In fact this is showing signs of excellent improvement which is great news. There is no evidence of active infection at this time which is also great news. No fevers, chills, nausea, vomiting, or diarrhea. 06/06/2021 upon evaluation today patient appears to be doing extremely well. In fact she is completely healed and the knee appears to have done excellent. There does not appear to be any evidence of active infection which is great news and overall I am extremely pleased with where we stand today. No fevers, chills, nausea, vomiting, or diarrhea. Electronic Signature(s) Signed: 06/06/2021 1:17:25 PM By: Worthy Keeler PA-C Entered By: Worthy Keeler on 06/06/2021 13:17:25 Jill Hudson, Jill Hudson ([id number]) -------------------------------------------------------------------------------- Physical Exam Details Patient Name: Jill Hudson Date of Service: 06/06/2021 12:30 PM Medical Record Number: [id number] Patient Account Number: [id number] Date of Birth/Sex: June 11, 1935 (86 y.o. F) Treating RN: Donnamarie Poag Primary Care Provider: [name] Other Clinician: Referring Provider: [name] Treating Provider/Extender: Jeri Cos Weeks in Treatment: 3 Constitutional Well-nourished and well-hydrated in no acute distress. Respiratory normal breathing without difficulty. Psychiatric this patient is able to make decisions and demonstrates good insight into disease process. Alert and Oriented x 3. pleasant and cooperative. Notes Upon  inspection patient's wound bed showed signs of good epithelization at this point. There does not appear to be any evidence of active infection  which is great news and overall I am extremely pleased with where the patient stands at this point. Electronic Signature(s) Signed: 06/06/2021 1:17:51 PM By: Worthy Keeler PA-C Entered By: Worthy Keeler on 06/06/2021 13:17:51 Jill Hudson, Jill Hudson ([id number]) -------------------------------------------------------------------------------- Physician Orders Details Patient Name: Jill Hudson Date of Service: 06/06/2021 12:30 PM Medical Record Number: [id number] Patient Account Number: [id number] Date of Birth/Sex: [birth year] (86 y.o. F) Treating RN: Donnamarie Poag Primary Care Provider: [name] Other Clinician: Referring Provider: [name] Treating Provider/Extender: Skipper Cliche in Treatment: 3 Verbal / Phone Orders: No Diagnosis Coding ICD-10 Coding Code Description [phone] Laceration without foreign body, left knee, initial encounter L97.828 Non-pressure chronic ulcer of other part of left lower leg with other specified severity I50.42 Chronic combined systolic (congestive) and diastolic (congestive) heart failure N18.9 Chronic kidney disease, unspecified I10 Essential (primary) hypertension E11.622 Type 2 diabetes mellitus with other skin ulcer Z79.01 Long term (current) use of anticoagulants Discharge From [facility] o Discharge from [place] for protection o Moisturize legs daily after removing compression garments. - Use unscented lotion after bathing Electronic Signature(s) Signed: 06/06/2021 1:43:26 PM By: Donnamarie Poag Signed: 06/06/2021 5:54:54 PM By: Worthy Keeler PA-C Entered By: Donnamarie Poag on 06/06/2021 13:08:23 Jill Hudson, Jill Hudson ([id number]) -------------------------------------------------------------------------------- Problem List Details [name] Date of Service: 06/06/2021 12:30 PM Medical Record Number: [id number] Patient Account Number: [id number] Date of Birth/Sex: Dec 25, 1934 (86 y.o. F) Treating RN: Donnamarie Poag Primary Care Provider: [name] Other Clinician: Referring Provider: [name] Treating Provider/Extender: Skipper Cliche in Treatment: 3 Active Problems ICD-10 Encounter Code Description Active Date MDM Diagnosis S81.012A Laceration without foreign body, left knee, initial encounter 05/16/2021 No Yes L97.828 Non-pressure chronic ulcer of other part of left lower leg with other 05/16/2021 No Yes specified severity I50.42 Chronic combined systolic (congestive) and diastolic (congestive) heart 05/16/2021 No Yes failure N18.9 Chronic kidney disease, unspecified 05/16/2021 No Yes I10 Essential (primary) hypertension 05/16/2021 No Yes E11.622 Type 2 diabetes mellitus with other skin ulcer 05/16/2021 No Yes Z79.01 Long term (current) use of anticoagulants 05/16/2021 No Yes Inactive Problems Resolved Problems Electronic Signature(s) Signed: 06/06/2021 1:04:27 PM By: Worthy Keeler PA-C Entered By: Worthy Keeler on 06/06/2021 13:04:27 Jill Hudson, Jill Hudson ([id number]) -------------------------------------------------------------------------------- Progress Note Details Patient Name: Jill Hudson Date of Service: 06/06/2021 12:30 PM Medical Record Number: [id number] Patient Account Number: [id number] Date of Birth/Sex: 1935-10-16 (86 y.o. F) Treating RN: Donnamarie Poag Primary Care Provider: [name] Other Clinician: Referring Provider: [name] Treating Provider/Extender: Skipper Cliche in Treatment: 3 Subjective Chief Complaint Information obtained from Patient Right knee trauma/laceration History of Present Illness (HPI) 05/16/2021 upon evaluation today patient presents today for initial evaluation here in the clinic concerning a wound that happened as a result of trauma on the  left medial knee. She currently tells me that she fell and scraped this area this caused a wound that has been somewhat painful for her. Right now it has a scab covering the majority of it which is a combination again of bleeding as well as drainage and hardening of the tissue. She tells me that the majority of the skin tore off whenever she hit and what did not she trimmed off. Nonetheless she currently tells me that was about 2 weeks ago and at this point she is seeing some improvement she feels like is starting to make turn around but nonetheless still has a significant wound here  that she feels like she could use to help with. She does have a history of congestive heart failure, chronic kidney disease, hypertension, diabetes, and is on anticoagulant therapy. Fortunately it does not look like we can have to have a tremendous and extensive debridement here but we do need to get the eschar off of the surface of the wound to allow this to heal. 05/23/2021 upon evaluation today patient appears to be doing well with regard to her wound. In fact this is showing signs of excellent improvement which is great news. There is no evidence of active infection at this time which is also great news. No fevers, chills, nausea, vomiting, or diarrhea. 06/06/2021 upon evaluation today patient appears to be doing extremely well. In fact she is completely healed and the knee appears to have done excellent. There does not appear to be any evidence of active infection which is great news and overall I am extremely pleased with where we stand today. No fevers, chills, nausea, vomiting, or diarrhea. Objective Constitutional Well-nourished and well-hydrated in no acute distress. Vitals Time Taken: 12:40 PM, Height: 60 in, Weight: 120 lbs, BMI: 23.4, Temperature: 97.8 F, Pulse: 62 bpm, Respiratory Rate: 16 breaths/min, Blood Pressure: 184/67 mmHg. Respiratory normal breathing without difficulty. Psychiatric this  patient is able to make decisions and demonstrates good insight into disease process. Alert and Oriented x 3. pleasant and cooperative. General Notes: Upon inspection patient's wound bed showed signs of good epithelization at this point. There does not appear to be any evidence of active infection which is great news and overall I am extremely pleased with where the patient stands at this point. Integumentary (Hair, Skin) Wound #1 status is Healed - Epithelialized. Original cause of wound was Trauma. The date acquired was: 05/02/2021. The wound has been in treatment 3 weeks. The wound is located on the Left,Medial Knee. The wound measures 0cm length x 0cm width x 0cm depth; 0cm^2 area and 0cm^3 volume. There is no tunneling or undermining noted. There is a medium amount of serosanguineous drainage noted. There is no granulation within the wound bed. There is no necrotic tissue within the wound bed. Assessment Active Problems Jill Hudson, Jill C. ([id number]) ICD-10 Laceration without foreign body, left knee, initial encounter Non-pressure chronic ulcer of other part of left lower leg with other specified severity Chronic combined systolic (congestive) and diastolic (congestive) heart failure Chronic kidney disease, unspecified Essential (primary) hypertension Type 2 diabetes mellitus with other skin ulcer Long term (current) use of anticoagulants Plan Discharge From [facility]: Discharge from [place] for protection Moisturize legs daily after removing compression garments. - Use unscented lotion after bathing 1. Would recommend currently that we go ahead and discontinue wound care services as the patient appears to be completely healed. 2. I did recommend derma saver for her to try to minimize leg damage she had inquired about the possibility of kneepads. I think the derma saver would be a much better option. We will see her back for follow-up  visit as needed. Electronic Signature(s) Signed: 06/06/2021 1:18:11 PM By: Worthy Keeler PA-C Entered By: Worthy Keeler on 06/06/2021 13:18:11 Jill Hudson, Jill Hudson ([id number]) -------------------------------------------------------------------------------- SuperBill Details Patient Name: Jill Hudson Date of Service: 06/06/2021 Medical Record Number: [id number] Patient Account Number: [id number] Date of Birth/Sex: 01-10-1935 (86 y.o. F) Treating RN: Donnamarie Poag Primary Care Provider: [name] Other Clinician: Referring Provider: [name] Treating Provider/Extender: Jeri Cos Weeks in Treatment: 3 Diagnosis Coding ICD-10  Codes Code Description S81.012A Laceration without foreign body, left knee, initial encounter L97.828 Non-pressure chronic ulcer of other part of left lower leg with other specified severity I50.42 Chronic combined systolic (congestive) and diastolic (congestive) heart failure N18.9 Chronic kidney disease, unspecified I10 Essential (primary) hypertension E11.622 Type 2 diabetes mellitus with other skin ulcer Z79.01 Long term (current) use of anticoagulants Facility Procedures CPT4 Code: [id number] Description: [id number] - WOUND CARE VISIT-LEV 2 EST PT Modifier: Quantity: 1 Physician Procedures CPT4 Code: 3818299 Description: 99213 - WC PHYS LEVEL 3 - EST PT Modifier: Quantity: 1 CPT4 Code: Description: ICD-10 Diagnosis Description S81.012A Laceration without foreign body, left knee, initial encounter L97.828 Non-pressure chronic ulcer of other part of left lower leg with other spe I50.42 Chronic combined systolic (congestive) and diastolic  (congestive) heart f N18.9 Chronic kidney disease, unspecified Modifier: cified severity ailure Quantity: Electronic Signature(s) Signed: 06/06/2021 1:18:22 PM By: Worthy Keeler PA-C Entered By: Worthy Keeler on 06/06/2021 13:18:22

## 2021-06-09 ENCOUNTER — Other Ambulatory Visit: Payer: Self-pay | Admitting: Internal Medicine

## 2021-06-09 DIAGNOSIS — J449 Chronic obstructive pulmonary disease, unspecified: Secondary | ICD-10-CM

## 2021-06-09 DIAGNOSIS — Z794 Long term (current) use of insulin: Secondary | ICD-10-CM | POA: Diagnosis not present

## 2021-06-09 DIAGNOSIS — E118 Type 2 diabetes mellitus with unspecified complications: Secondary | ICD-10-CM | POA: Diagnosis not present

## 2021-06-09 NOTE — Telephone Encounter (Signed)
Requested medication (s) are due for refill today: yes  Requested medication (s) are on the active medication list: yes  Last refill:  03/20/21 180 ml  Future visit scheduled: yes  Notes to clinic:  med not delegated to NT to RF   Requested Prescriptions  Pending Prescriptions Disp Refills   promethazine-dextromethorphan (PROMETHAZINE-DM) 6.25-15 MG/5ML syrup [Pharmacy Med Name: PROMETHAZINE DM SYRUP] 180 mL 0    Sig: TAKE 5 ML BY MOUTH FOUR TIMES DAILY AS NEEDED FOR COUGH     Ear, Nose, and Throat:  Antitussives/Expectorants Passed - 06/09/2021  4:19 PM      Passed - Valid encounter within last 12 months    Recent Outpatient Visits           1 month ago Cellulitis of left lower extremity   [facility] [name], MD   1 month ago Localized edema   [facility] [name], MD   2 months ago Essential hypertension   [facility] [name], MD   5 months ago Lumbosacral radiculopathy at L4   [facility] [name], MD   8 months ago Trochanteric bursitis of right hip   [facility] [name], MD       Future Appointments             In 2 months Army Melia Jesse Sans, MD [facility], [place]   In 9 months Army Melia Jesse Sans, MD [facility], [facility]

## 2021-06-10 ENCOUNTER — Other Ambulatory Visit: Payer: Self-pay | Admitting: Internal Medicine

## 2021-06-10 DIAGNOSIS — M5417 Radiculopathy, lumbosacral region: Secondary | ICD-10-CM

## 2021-06-10 NOTE — Telephone Encounter (Signed)
Future visit in 2 months  

## 2021-06-15 ENCOUNTER — Other Ambulatory Visit: Payer: Self-pay | Admitting: Internal Medicine

## 2021-06-15 DIAGNOSIS — M5417 Radiculopathy, lumbosacral region: Secondary | ICD-10-CM

## 2021-06-15 NOTE — Telephone Encounter (Signed)
Called Pharmacist and was informed the pt has picked up Rx

## 2021-06-16 DIAGNOSIS — E1159 Type 2 diabetes mellitus with other circulatory complications: Secondary | ICD-10-CM | POA: Diagnosis not present

## 2021-06-16 DIAGNOSIS — I152 Hypertension secondary to endocrine disorders: Secondary | ICD-10-CM | POA: Diagnosis not present

## 2021-06-16 DIAGNOSIS — Z794 Long term (current) use of insulin: Secondary | ICD-10-CM | POA: Diagnosis not present

## 2021-06-16 DIAGNOSIS — E1169 Type 2 diabetes mellitus with other specified complication: Secondary | ICD-10-CM | POA: Diagnosis not present

## 2021-06-16 DIAGNOSIS — E1142 Type 2 diabetes mellitus with diabetic polyneuropathy: Secondary | ICD-10-CM | POA: Diagnosis not present

## 2021-06-16 DIAGNOSIS — E785 Hyperlipidemia, unspecified: Secondary | ICD-10-CM | POA: Diagnosis not present

## 2021-06-16 LAB — HEMOGLOBIN A1C: Hemoglobin A1C: 7.5

## 2021-06-17 ENCOUNTER — Telehealth: Payer: Self-pay

## 2021-06-17 NOTE — Progress Notes (Signed)
Chronic Care Management Pharmacy Assistant   Name: TERYN BOEREMA  MRN: [id number] DOB: 10-03-1935   Reason for Encounter: Disease State Diabetes     Recent office visits:  None noted   Recent consult visits:  06/06/21 Woodroe Chen PA - [facility] - No medication changes notes - No follow ups noted   Hospital visits:  Medication Reconciliation was completed by comparing discharge summary, patient's EMR and Pharmacy list, and upon discussion with patient.  Admitted to the hospital on 05/02/21 due to Fall. Discharge date was 05/02/21. Discharged from [place]?Medications Started at [facility] Discharge:?? -started  Vicodin 5-325 MG tablet  Every 6 hours PRN, and ondansetron (ZOFRAN ODT) 4 MG disintegrating tablet  Every 6 hours PRN       Medication Changes at Hospital Discharge: N/a  Medications Discontinued at Hospital Discharge: N/a  Medications that remain the same after Hospital Discharge:??  -All other medications will remain the same.    Medications: Outpatient Encounter Medications as of 06/17/2021  Medication Sig Note   acetaminophen (TYLENOL) 650 MG CR tablet Take 1,300 mg by mouth every 8 (eight) hours as needed for pain.    albuterol (VENTOLIN HFA) 108 (90 Base) MCG/ACT inhaler Inhale 2 puffs into the lungs every 6 (six) hours as needed for wheezing or shortness of breath.    amLODipine (NORVASC) 5 MG tablet Take 5 mg by mouth in the morning and at bedtime.    B-D ULTRAFINE III SHORT PEN 31G X 8 MM MISC USE AS DIRECTED    cloNIDine (CATAPRES) 0.2 MG tablet Take 1 tablet (0.2 mg total) by mouth 2 (two) times daily. Please check blood pressure prior to taking medication.  Only take if blood pressure is greater than 465 systolic. (Patient taking differently: Take 0.2 mg by mouth as needed. Please check blood pressure prior to taking medication.  Only take if blood pressure is greater than 681 systolic.)    Cyanocobalamin  1000 MCG TBCR Take 1,000 mcg by mouth daily.    ELIQUIS 2.5 MG TABS tablet TAKE 1 TABLET(2.5 MG) BY MOUTH TWICE DAILY    feeding supplement, GLUCERNA SHAKE, (GLUCERNA SHAKE) LIQD Take 237 mLs by mouth 3 (three) times daily between meals.    ferrous sulfate 325 (65 FE) MG tablet Take 325 mg by mouth daily with breakfast.    gabapentin (NEURONTIN) 100 MG capsule TAKE 1 CAPSULE(100 MG) BY MOUTH AT BEDTIME    HYDROcodone-acetaminophen (NORCO/VICODIN) 5-325 MG tablet Take 1 tablet by mouth every 6 (six) hours as needed.    insulin aspart (NOVOLOG FLEXPEN) 100 UNIT/ML FlexPen Inject 3 Units into the skin 3 (three) times daily with meals. At lunch (Patient taking differently: Inject 20 Units into the skin daily. At breakfast- sliding scale) 02/25/2021: 4 units tid AC meals  (increase by 1 unit if BG>250)   Insulin Glargine (BASAGLAR KWIKPEN) 100 UNIT/ML INJECT 12 UNITS UNDER THE SKIN DAILY 02/25/2021: Taking 5 units q am    losartan (COZAAR) 25 MG tablet Take 25 mg by mouth 2 (two) times daily. Unsure of dosage    melatonin 5 MG TABS Take 0.5 tablets (2.5 mg total) by mouth at bedtime as needed (sleep).    Multiple Vitamins-Minerals (PRESERVISION AREDS PO) Take 1 capsule by mouth 2 (two) times daily.     ondansetron (ZOFRAN ODT) 4 MG disintegrating tablet Take 1 tablet (4 mg total) by mouth every 6 (six) hours as needed for nausea  or vomiting.    promethazine-dextromethorphan (PROMETHAZINE-DM) 6.25-15 MG/5ML syrup TAKE 5 ML BY MOUTH FOUR TIMES DAILY AS NEEDED FOR COUGH    rosuvastatin (CRESTOR) 5 MG tablet Take 5 mg by mouth every other day. Mon, Wed, Fri and Sat.    torsemide (DEMADEX) 20 MG tablet Take 2 tablets (40 mg total) by mouth daily. (Patient taking differently: Take 40 mg by mouth 2 (two) times daily. 1/2 tablet morning and 1/2 tablet at night.)    No facility-administered encounter medications on file as of 06/17/2021.   Recent Relevant Labs: Lab Results  Component Value Date/Time   HGBA1C 8.8  02/10/2021 12:00 AM   HGBA1C 7.7 09/30/2020 12:00 AM    Kidney Function Lab Results  Component Value Date/Time   CREATININE 1.65 (H) 12/25/2020 02:27 PM   CREATININE 1.42 (H) 07/20/2020 05:27 PM   CREATININE 1.10 05/29/2014 12:02 PM   CREATININE 0.99 09/09/2013 04:13 AM   GFRNONAA 34 (L) 07/20/2020 05:27 PM   GFRNONAA 48 (L) 05/29/2014 12:02 PM   GFRAA 39 (L) 07/20/2020 05:27 PM   GFRAA 56 (L) 05/29/2014 12:02 PM    Current antihyperglycemic regimen:  Patient states that today 06/18/21 was her first day taking jardiance  What recent interventions/DTPs have been made to improve glycemic control:  N/a Have there been any recent hospitalizations or ED visits since last visit with [facility]? Yes Patient denies hypoglycemic symptoms, including None  Patient denies hyperglycemic symptoms, including none How often are you checking your blood sugar? 3-4 times daily What are your blood sugars ranging?  Fasting: 136 Before meals: N/a After meals: 187 Bedtime: N/a During the week, how often does your blood glucose drop below 70? Patient states that she did have a low blood sugar of 67 a couple of days ago.  Are you checking your feet daily/regularly? Patient states that she does not check her feet often but did stub her toe a couple of weeks ago, she states that it has healed over.   Adherence Review: Is the patient currently on a STATIN medication? Yes Is the patient currently on ACE/ARB medication? Yes Does the patient have >5 day gap between last estimated fill dates? No    Star Rating Drugs: Losartan 25 MG last filled 05/28/21 90 DS Rosuvastatin last filled 05/05/21 84 DS  Andee Poles, CMA

## 2021-06-19 ENCOUNTER — Other Ambulatory Visit: Payer: Self-pay

## 2021-06-19 DIAGNOSIS — Z1231 Encounter for screening mammogram for malignant neoplasm of breast: Secondary | ICD-10-CM

## 2021-07-07 DIAGNOSIS — H353221 Exudative age-related macular degeneration, left eye, with active choroidal neovascularization: Secondary | ICD-10-CM | POA: Diagnosis not present

## 2021-07-07 DIAGNOSIS — H353211 Exudative age-related macular degeneration, right eye, with active choroidal neovascularization: Secondary | ICD-10-CM | POA: Diagnosis not present

## 2021-07-10 DIAGNOSIS — Z794 Long term (current) use of insulin: Secondary | ICD-10-CM | POA: Diagnosis not present

## 2021-07-10 DIAGNOSIS — E118 Type 2 diabetes mellitus with unspecified complications: Secondary | ICD-10-CM | POA: Diagnosis not present

## 2021-07-18 ENCOUNTER — Other Ambulatory Visit: Payer: Self-pay | Admitting: Internal Medicine

## 2021-07-18 DIAGNOSIS — J449 Chronic obstructive pulmonary disease, unspecified: Secondary | ICD-10-CM

## 2021-07-21 DIAGNOSIS — E1122 Type 2 diabetes mellitus with diabetic chronic kidney disease: Secondary | ICD-10-CM | POA: Diagnosis not present

## 2021-07-21 DIAGNOSIS — R600 Localized edema: Secondary | ICD-10-CM | POA: Diagnosis not present

## 2021-07-21 DIAGNOSIS — I10 Essential (primary) hypertension: Secondary | ICD-10-CM | POA: Diagnosis not present

## 2021-07-21 DIAGNOSIS — R8290 Unspecified abnormal findings in urine: Secondary | ICD-10-CM | POA: Diagnosis not present

## 2021-07-21 DIAGNOSIS — N1832 Chronic kidney disease, stage 3b: Secondary | ICD-10-CM | POA: Diagnosis not present

## 2021-08-04 ENCOUNTER — Ambulatory Visit (INDEPENDENT_AMBULATORY_CARE_PROVIDER_SITE_OTHER): Payer: Medicare Other

## 2021-08-04 DIAGNOSIS — I10 Essential (primary) hypertension: Secondary | ICD-10-CM

## 2021-08-04 DIAGNOSIS — I5032 Chronic diastolic (congestive) heart failure: Secondary | ICD-10-CM

## 2021-08-04 DIAGNOSIS — Z9181 History of falling: Secondary | ICD-10-CM

## 2021-08-04 DIAGNOSIS — N1831 Chronic kidney disease, stage 3a: Secondary | ICD-10-CM

## 2021-08-04 NOTE — Chronic Care Management (AMB) (Signed)
Chronic Care Management   CCM RN Visit Note  08/04/2021 Name: Jill Hudson MRN: [id number] DOB: 1935/02/08  Subjective: Jill Hudson is a 86 y.o. year old female who is a primary care patient of [name], MD. The care management team was consulted for assistance with disease management and care coordination needs.    Engaged with patient by telephone for follow up visit in response to provider referral for case management and/or care coordination services.   Consent to Services:  The patient was given information about Chronic Care Management services, agreed to services, and gave verbal consent prior to initiation of services.  Please see initial visit note for detailed documentation.   Patient agreed to services and verbal consent obtained.   Assessment: Review of patient past medical history, allergies, medications, health status, including review of consultants reports, laboratory and other test data, was performed as part of comprehensive evaluation and provision of chronic care management services.   SDOH (Social Determinants of Health) assessments and interventions performed:    CCM Care Plan  Allergies  Allergen Reactions   Saxagliptin Diarrhea   Epinephrine Other (See Comments)    Patient does not remember what happens when she uses this   Atorvastatin Other (See Comments)    Muscle aches   Codeine Other (See Comments)    Upset stomach   Ezetimibe Other (See Comments)    Myalgias(ZETIA)   Limonene Rash    Patient does not recall this reaction   Nitrofurantoin Rash and Other (See Comments)    Pruitus   Sulfa Antibiotics Rash and Other (See Comments)    Sore mouth     Outpatient Encounter Medications as of 08/04/2021  Medication Sig Note   acetaminophen (TYLENOL) 650 MG CR tablet Take 1,300 mg by mouth every 8 (eight) hours as needed for pain.    albuterol (VENTOLIN HFA) 108 (90 Base) MCG/ACT inhaler Inhale 2 puffs into the lungs every 6 (six) hours as  needed for wheezing or shortness of breath.    amLODipine (NORVASC) 5 MG tablet Take 5 mg by mouth in the morning and at bedtime.    B-D ULTRAFINE III SHORT PEN 31G X 8 MM MISC USE AS DIRECTED    cloNIDine (CATAPRES) 0.2 MG tablet Take 1 tablet (0.2 mg total) by mouth 2 (two) times daily. Please check blood pressure prior to taking medication.  Only take if blood pressure is greater than 675 systolic. (Patient taking differently: Take 0.2 mg by mouth as needed. Please check blood pressure prior to taking medication.  Only take if blood pressure is greater than 916 systolic.)    Cyanocobalamin 1000 MCG TBCR Take 1,000 mcg by mouth daily.    ELIQUIS 2.5 MG TABS tablet TAKE 1 TABLET(2.5 MG) BY MOUTH TWICE DAILY    feeding supplement, GLUCERNA SHAKE, (GLUCERNA SHAKE) LIQD Take 237 mLs by mouth 3 (three) times daily between meals.    ferrous sulfate 325 (65 FE) MG tablet Take 325 mg by mouth daily with breakfast.    gabapentin (NEURONTIN) 100 MG capsule TAKE 1 CAPSULE(100 MG) BY MOUTH AT BEDTIME    HYDROcodone-acetaminophen (NORCO/VICODIN) 5-325 MG tablet Take 1 tablet by mouth every 6 (six) hours as needed.    insulin aspart (NOVOLOG FLEXPEN) 100 UNIT/ML FlexPen Inject 3 Units into the skin 3 (three) times daily with meals. At lunch (Patient taking differently: Inject 20 Units into the skin daily. At breakfast- sliding scale) 02/25/2021: 4 units tid AC meals  (increase by 1  unit if BG>250)   Insulin Glargine (BASAGLAR KWIKPEN) 100 UNIT/ML INJECT 12 UNITS UNDER THE SKIN DAILY 02/25/2021: Taking 5 units q am    losartan (COZAAR) 25 MG tablet Take 25 mg by mouth 2 (two) times daily. Unsure of dosage    melatonin 5 MG TABS Take 0.5 tablets (2.5 mg total) by mouth at bedtime as needed (sleep).    Multiple Vitamins-Minerals (PRESERVISION AREDS PO) Take 1 capsule by mouth 2 (two) times daily.     ondansetron (ZOFRAN ODT) 4 MG disintegrating tablet Take 1 tablet (4 mg total) by mouth every 6 (six) hours as needed  for nausea or vomiting.    promethazine-dextromethorphan (PROMETHAZINE-DM) 6.25-15 MG/5ML syrup TAKE 5 ML BY MOUTH FOUR TIMES DAILY AS NEEDED FOR COUGH    rosuvastatin (CRESTOR) 5 MG tablet Take 5 mg by mouth every other day. Mon, Wed, Fri and Sat.    torsemide (DEMADEX) 20 MG tablet Take 2 tablets (40 mg total) by mouth daily. (Patient taking differently: Take 40 mg by mouth 2 (two) times daily. 1/2 tablet morning and 1/2 tablet at night.)    No facility-administered encounter medications on file as of 08/04/2021.    Patient Active Problem List   Diagnosis Date Noted   Acquired thrombophilia ([place]) 03/20/2021   Mood disorder ([place]) 05/02/2020   Chronic heart failure with preserved ejection fraction ([place]) 04/12/2020   Diverticulosis of large intestine without perforation or abscess with bleeding 03/27/2020   AF (paroxysmal atrial fibrillation) ([place])    Thrush    COPD (chronic obstructive pulmonary disease) with chronic bronchitis ([place])    CKD (chronic kidney disease), stage IIIa 02/18/2020   Malnutrition of mild degree ([place]) 01/08/2020   Age-related macular degeneration, dry, left eye 06/21/2019   Age-related macular degeneration, wet, right eye ([place]) 06/21/2019   Moderate nonproliferative diabetic retinopathy associated with type 2 diabetes mellitus ([place]) 06/21/2019   Lumbosacral radiculopathy at L4 05/01/2019   Underweight 05/01/2019   Encounter for long-term (current) use of aspirin 10/31/2018   Encounter for long-term (current) use of antiplatelets/antithrombotics 10/31/2018   Long term current use of oral hypoglycemic drug 10/31/2018   Encounter for long-term (current) use of insulin ([place]) 10/31/2018   GIB (gastrointestinal bleeding) 08/11/2018   Leg pain 07/03/2017   Carpal tunnel syndrome on both sides 05/05/2017   Myalgia due to HMG CoA reductase inhibitor 05/05/2017   History of CVA (cerebrovascular accident) 02/15/2017   Degenerative disc disease, lumbar 12/21/2016    Atherosclerosis of native arteries of extremity with intermittent claudication ([place]) 12/20/2016   Bilateral carotid artery stenosis 12/20/2016   Occlusion and stenosis of bilateral carotid arteries 12/20/2016   Hip bursitis 05/18/2016   Elevated TSH 01/18/2016   CKD stage 3 due to type 2 diabetes mellitus ([place]) 01/16/2016   Senile ecchymosis 01/16/2016   Type II diabetes mellitus with renal manifestations ([place]) 09/16/2015   GERD (gastroesophageal reflux disease) 07/24/2015   PVD (peripheral vascular disease) ([place]) 05/17/2015   Neoplasm of uncertain behavior of skin 05/17/2015   Retinopathy, diabetic, proliferative ([place]) 04/11/2015   Hyperlipidemia associated with type 2 diabetes mellitus ([place]) 04/11/2015   Essential hypertension 04/11/2015   Generalized OA 04/11/2015   Proliferative diabetic retinopathy(362.02) 04/11/2015   Arteriosclerosis of coronary artery 05/29/2013   Hypertensive heart disease without CHF 05/29/2013    Conditions to be addressed/monitored:CHF, HTN, DMII, and falls and safety  Care Plan : RNCM: Heart Failure (Adult)  Updates made by [name], RN since 08/04/2021 12:00 AM  Problem: RNCM: Symptom Exacerbation (Heart Failure)   Priority: Medium     Long-Range Goal: RNCM: HF Symptom Exacerbation Prevented or Minimized   Start Date: 04/15/2021  Expected End Date: 04/15/2022  This Visit's Progress: On track  Recent Progress: On track  Priority: Medium  Note:   Current Barriers:  Knowledge deficits related to basic heart failure pathophysiology and self care management Does not contact provider office for questions/concerns Lack of scale in home Financial strain Nurse Case Manager Clinical Goal(s):  patient will weigh self daily and record patient will verbalize understanding of Heart Failure Action Plan and when to call doctor patient will take all Heart Failure mediations as prescribed Wt Readings from Last 3 Encounters:  05/09/21 120 lb (54.4 kg)   05/01/21 123 lb (55.8 kg)  04/21/21 123 lb (55.8 kg)    Interventions:  Collaboration with [name], MD regarding development and update of comprehensive plan of care as evidenced by provider attestation and co-signature Inter-disciplinary care team collaboration (see longitudinal plan of care) Basic overview and discussion of pathophysiology of Heart Failure Provided written and verbal education on low sodium diet. 08-04-2021: The patient states that she does not eat a lot but is compliant with a heart healthy/ADA diet. Denies any issues with dietary restrictions.  Reviewed Heart Failure Action Plan in depth and provided written copy Assessed for scales in home- 08-04-2021: The patient weighs daily. States her weight is 118-120. She knows to call her cardiologist if weight is over 120 due to risk of exacerbation of HF. Sees her specialist on a regular basis. Feels like her HF is well controlled at this time. Discussed importance of daily weight. 08-04-2021: Denies any issues with management of HF and daily weights Reviewed role of diuretics in prevention of fluid overload Self-Care Activities:  Takes Heart Failure Medications as prescribed. States compliance with medications.  Weighs daily and record (notifying MD of 3 lb weight gain over night or 5 lb in a week). 06-03-2021: States her weright today was 118. Usual weight is 115 to 120 Verbalizes understanding of and follows CHF Action Plan. Denies any edema or swelling. Has not had hospitalization for HF >6 months. Knows what to look for for sx and sx of exacerbation. Consistent with daily weights. 08-04-2021: Is compliant with plan of care for HF. Denies any weight fluctuations. Will continue to monitor.  Adheres to low sodium diet. Patient states she does not have an appetite but she monitors her weight and is eating best as she can.  Patient Goals:  - Take Heart Failure Medications as prescribed - Weigh daily and record (notify MD  with 3 lb weight gain over night or 5 lb in a week) - Follow CHF Action Plan - Adhere to low sodium diet - develop a rescue plan - eat more whole grains, fruits and vegetables, lean meats and healthy fats - follow rescue plan if symptoms flare-up - know when to call the doctor - track symptoms and what helps feel better or worse - dress right for the weather, hot or cold - avoid heavy exercise on very hot days - drink water to stay hydrated during exercise - follow activity or exercise plan - make an activity or exercise plan - pace activity allowing for rest - warm up and cool down for 10 minutes before and after exercise - call office if I gain more than 2 pounds in one day or 5 pounds in one week - keep legs up while sitting -  track weight in diary - use salt in moderation - watch for swelling in feet, ankles and legs every day - weigh myself daily - barriers to lifestyle changes reviewed and addressed - barriers to treatment reviewed and addressed - cognitive screening completed and reviewed - depression screen reviewed - health literacy screening completed or reviewed - healthy lifestyle promoted - medication-adherence assessment completed - rescue (action) plan developed - rescue (action) plan reviewed - self-awareness of signs/symptoms of worsening disease encouraged Follow Up Plan: Telephone follow up appointment with care management team member scheduled for: December 2022    Care Plan : RNCM: Diabetes Type 2 (Adult)  Updates made by [name], RN since 08/04/2021 12:00 AM     Problem: RNCM: Glycemic Management (Diabetes, Type 2)   Priority: Medium     Long-Range Goal: RNCM: Glycemic Management Optimized   Start Date: 04/15/2021  Expected End Date: 04/15/2022  This Visit's Progress: On track  Recent Progress: On track  Priority: Medium  Note:   Objective:  Lab Results  Component Value Date   HGBA1C 8.8 02/10/2021  06-16-2021 A1C at endocrinologist  office was 7.4  Lab Results  Component Value Date   CREATININE 1.65 (H) 12/25/2020   CREATININE 1.42 (H) 07/20/2020   CREATININE 1.57 (H) 05/02/2020   Lab Results  Component Value Date   EGFR 30 (L) 12/25/2020   Current Barriers:  Knowledge Deficits related to basic Diabetes pathophysiology and self care/management Knowledge Deficits related to medications used for management of diabetes Unable to independently manage DM Does not contact provider office for questions/concerns Case Manager Clinical Goal(s):  patient will demonstrate improved adherence to prescribed treatment plan for diabetes self care/management as evidenced by: daily monitoring and recording of CBG  adherence to ADA/ carb modified diet exercise 4/5 days/week adherence to prescribed medication regimen contacting provider for new or worsened symptoms or questions Interventions:  Collaboration with [name], MD regarding development and update of comprehensive plan of care as evidenced by provider attestation and co-signature Inter-disciplinary care team collaboration (see longitudinal plan of care) Provided education to patient about basic DM disease process Reviewed medications with patient and discussed importance of medication adherence. 06-03-2021: The patient endorses compliance with medications. 08-04-2021: The patient was started on Jardiance at her 06-16-2021 visit with endocrinology but she only took it a few days and it caused worsening diarrhea. She stopped taking. States she has a follow up with the specialist coming up and pcp in November. Education and support given.  Discussed plans with patient for ongoing care management follow up and provided patient with direct contact information for care management team Provided patient with written educational materials related to hypo and hyperglycemia and importance of correct treatment. 08-04-2021: The patient denies any low blood sugars at this time. States she  is doing well with her DM.  Advised patient, providing education and rationale, to check cbg BID and record, calling pcp/endocrinologist  for findings outside established parameters.  06-03-2021: Range is 100-125. 08-04-2021: The patient states that her range in the am is in the 130's and later on in the day it is higher but then it goes down. Review of fasting blood sugars of <130 and post prandial of <180. The patient states she feels like her A1C will be better the next time because her blood sugars are more in range now. The patient will continue to monitor and call for changes.  Review of patient status, including review of consultants reports, relevant laboratory and  other test results, and medications completed. Self-Care Activities - Self administers oral medications as prescribed Attends all scheduled provider appointments Checks blood sugars as prescribed and utilize hyper and hypoglycemia protocol as needed Adheres to prescribed ADA/carb modified Patient Goals: - check blood sugar at prescribed times - check blood sugar before and after exercise - check blood sugar if I feel it is too high or too low - enter blood sugar readings and medication or insulin into daily log - take the blood sugar log to all doctor visits - change to whole grain breads, cereal, pasta - set goal weight - drink 6 to 8 glasses of water each day - fill half of plate with vegetables - limit fast food meals to no more than 1 per week - manage portion size - prepare main meal at home 3 to 5 days each week - read food labels for fat, fiber, carbohydrates and portion size - reduce red meat to 2 to 3 times a week - schedule appointment with eye doctor - check feet daily for cuts, sores or redness - keep feet up while sitting - trim toenails straight across - wash and dry feet carefully every day - wear comfortable, cotton socks - wear comfortable, well-fitting shoes - barriers to adherence to treatment plan  identified - blood glucose monitoring encouraged - blood glucose readings reviewed - mutual A1C goal set or reviewed - resources required to improve adherence to care identified - self-awareness of signs/symptoms of hypo or hyperglycemia encouraged - use of blood glucose monitoring log promoted Follow Up Plan: Telephone follow up appointment with care management team member scheduled for: December 2022    Care Plan : RNCM: Hypertension (Adult)  Updates made by [name], RN since 08/04/2021 12:00 AM     Problem: RNCM: Hypertension (Hypertension)   Priority: Medium     Long-Range Goal: RNCM: Hypertension Monitored   Start Date: 04/15/2021  Expected End Date: 04/15/2022  This Visit's Progress: On track  Recent Progress: On track  Priority: Medium  Note:   Objective:  Last practice recorded BP readings:  BP Readings from Last 3 Encounters:  05/09/21 140/72  05/02/21 (!) 151/87  04/21/21 (!) 124/40  150/50 and pulse 69 on 06-16-2021 when seeing endocrinology   Most recent eGFR/CrCl:  Lab Results  Component Value Date   EGFR 30 (L) 12/25/2020    No components found for: CRCL Current Barriers:  Knowledge Deficits related to basic understanding of hypertension pathophysiology and self care management Knowledge Deficits related to understanding of medications prescribed for management of hypertension Unable to independently manage HTN Does not contact provider office for questions/concerns Case Manager Clinical Goal(s):  patient will verbalize understanding of plan for hypertension management patient will demonstrate improved adherence to prescribed treatment plan for hypertension as evidenced by taking all medications as prescribed, monitoring and recording blood pressure as directed, adhering to low sodium/DASH diet patient will demonstrate improved health management independence as evidenced by checking blood pressure as directed and notifying PCP if SBP>160 or DBP > 90,  taking all medications as prescribe, and adhering to a low sodium diet as discussed. patient will verbalize basic understanding of hypertension disease process and self health management plan as evidenced by compliance with medications, compliance with heart healthy/ADA diet and working with the CCM team to optimize health and well being.  Interventions:  Collaboration with [name], MD regarding development and update of comprehensive plan of care as evidenced by provider attestation and co-signature Inter-disciplinary  care team collaboration (see longitudinal plan of care) Evaluation of current treatment plan related to hypertension self management and patient's adherence to plan as established by provider. 06-03-2021: The patient states she is doing better with her blood pressures since her fall. Denies any elevated blood pressures. States when she is stressed her blood pressure goes up. 08-04-2021: Blood pressure is more stable. The patient denies any low blood pressures. Is monitoring for changes. Will continue to monitor.  Provided education to patient re: stroke prevention, s/s of heart attack and stroke, DASH diet, complications of uncontrolled blood pressure Reviewed medications with patient and discussed importance of compliance. 08-04-2021: The patient is compliant with medications. The patient states that the cardiologist wanted to change one of her medications but she is waiting on approval from her insurance. The patient is actively working with the pharm D.  Discussed plans with patient for ongoing care management follow up and provided patient with direct contact information for care management team Advised patient, providing education and rationale, to monitor blood pressure daily and record, calling PCP for findings outside established parameters.  Self-Care Activities: - Self administers medications as prescribed Attends all scheduled provider appointments Calls provider office  for new concerns, questions, or BP outside discussed parameters Checks BP and records as discussed Follows a low sodium diet/DASH diet Patient Goals: - check blood pressure weekly - choose a place to take my blood pressure (home, clinic or office, retail store) - write blood pressure results in a log or diary - agree on reward when goals are met - agree to work together to make changes - ask questions to understand - have a family meeting to talk about healthy habits - learn about high blood pressure - blood pressure trends reviewed - depression screen reviewed - home or ambulatory blood pressure monitoring encouraged Follow Up Plan: Telephone follow up appointment with care management team member scheduled for: December 2022    Care Plan : RNCM: Fall Risk (Adult)  Updates made by [name], RN since 08/04/2021 12:00 AM     Problem: RNCM: Fall Risk   Priority: High  Onset Date: 05/02/2021     Long-Range Goal: RNCM: Absence of Fall and Fall-Related Injury   Start Date: 06/03/2021  Expected End Date: 06/03/2022  This Visit's Progress: On track  Recent Progress: On track  Priority: High  Note:   Current Barriers:  Knowledge Deficits related to fall precautions in patient with  Decreased adherence to prescribed treatment for fall prevention Unable to independently manage falls as evidence of fall on 05-02-2021 with injury to left knee, evaluated in the ER and follow up with the pcp on 05-09-2021 with referral to wound clinic Unable to perform IADLs independently Does not contact provider office for questions/concerns High fall risk Knowledge Deficits related to fall prevention and safety in the home and surroundings  Chronic Disease Management support and education needs related to patient who is at hight risk for falls with multiple chronic conditions Lacks caregiver support.  Spouse is bedridden and requires 24/7 care in the home Clinical Goal(s):  patient will demonstrate  improved adherence to prescribed treatment plan for decreasing falls as evidenced by patient reporting and review of EMR patient will verbalize using fall risk reduction strategies discussed patient will not experience additional falls patient will verbalize understanding of plan for fall prevention and safety in the home and using DME when ambulating to decrease the risk of falls  patient will work with [facility] and pcp to  address needs related to chronic conditions impacting patients care and high risk for falls in the patient with limited mobility patient will demonstrate a decrease in fall exacerbations patient will take all medications exactly as prescribed and will call provider for medication related questions patient will demonstrate improved health management independence patient will demonstrate understanding of rationale for each prescribed medication the patient will demonstrate ongoing self health care management ability Interventions:  Collaboration with [name], MD regarding development and update of comprehensive plan of care as evidenced by provider attestation and co-signature Inter-disciplinary care team collaboration (see longitudinal plan of care) Provided written and verbal education re: Potential causes of falls and Fall prevention strategies Reviewed medications and discussed potential side effects of medications such as dizziness and frequent urination Assessed for s/s of orthostatic hypotension. 08-04-2021: Denies issues with orthostatic hypotension Assessed for falls since last encounter. 06-03-2021: The patient denies any new falls. Last recorded fall was 05-02-2021. The patient was evaluated in the ER. Saw pcp on 05-09-2021 and was referred to the wound clinic. The patient has a follow up appointment on 06-06-2021 with wound clinic MD. 08-04-2021: The patient denies any new falls. She endorses using her cane when ambulating. States her leg is numb but she has not had  falls. Review of safety and precautions. Will continue to monitor.  Assessed patients knowledge of fall risk prevention secondary to previously provided education. 06-03-2021: Reviewed with the patient the benefits of using DME and preventing falls in her environment. Education on head injuries and fractures likely with continued falls. The patient verbalized she is using her DME and also holding on to things in the home. She states she fell on her left side after getting her feet tangled in a rug. 08-04-2021: The patient is using her cane and being cautious. Her daughter is also there and she is thankful for the support of her daughter.  Assessed working status of life alert bracelet and patient adherence Provided patient information for fall alert systems Evaluation of current treatment plan related to falls and safety and patient's adherence to plan as established by provider. 08-04-2021: Reviewed with the patient the importance of being safe and continued use of DME.  The patient states that if she has to walk long distances she uses her wheelchair. Denies any new concerns at this time.  Advised patient to call the office for any new falls or injuries, or seek emergent care  Provided education to patient re: preventing falls and being mindful of surroundings, use of DME, monitoring for changes in blood pressures, and remaining safe in her environment. Discussed plans with patient for ongoing care management follow up and provided patient with direct contact information for care management team Self-Care Deficits:  Unable to independently manage falls and safety as evidence of fall on 05-02-2021 with injury Lacks social connections Unable to perform IADLs independently Patient Goals:  - Utilize cane or walker (assistive device) appropriately with all ambulation, patient also has a wheelchair - De-clutter walkways - Change positions slowly - Wear secure fitting shoes at all times with ambulation -  Utilize home lighting for dim lit areas - Demonstrate self and pet awareness at all times Follow Up Plan: Telephone follow up appointment with care management team member scheduled for: December 2022     Plan:Telephone follow up appointment with care management team member scheduled for:  December 2022  Noreene Larsson RN, MSN, [facility] Mobile:  [phone]          

## 2021-08-04 NOTE — Patient Instructions (Signed)
Visit Information  PATIENT GOALS:  Goals Addressed             This Visit's Progress    RNCM: Prevent Falls and Injury       Follow Up Date: December 2022   - add more outdoor lighting - always use handrails on the stairs - always wear low-heeled or flat shoes or slippers with nonskid soles - call the doctor if I am feeling too drowsy - install bathroom grab bars - join an exercise group in my community - keep a flashlight by the bed - keep my cell phone with me always - learn how to get back up if I fall - make an emergency alert plan in case I fall - pick up clutter from the floors - use a nonslip pad with throw rugs, or remove them completely - use a cane or walker - use a nightlight in the bathroom - wear my glasses and/or hearing aid    Why is this important?   Most falls happen when it is hard for you to walk safely. Your balance may be off because of an illness. You may have pain in your knees, hip or other joints.  You may be overly tired or taking medicines that make you sleepy. You may not be able to see or hear clearly.  Falls can lead to broken bones, bruises or other injuries.  There are things you can do to help prevent falling.     Notes: 06-03-2021: The patient had a fall on 05-02-2021 with injury to left knee. The patient did not have any fractures. Saw the pcp on 05-09-2021 for infected wound to left knee. Was referred to the wound clinic. Has a follow up on 06-06-2021 with wound clinic. Review with the patient safety precautions and using DME when ambulating. The patient has a walker, a cane, and a wheelchair. Her daughter Judson Roch is with her most of the time. Education and support given. 08-04-2021: The patient states that she is doing well. She does have numbness in her legs and walks with her cane. The patient denies any falls since using her cane.  She had three falls before beginning to use her cane. She uses her wheelchair when she needs to use it.  Will continue  to monitor.         Patient verbalizes understanding of instructions provided today and agrees to view in [place].   Telephone follow up appointment with care management team member scheduled for: December 2022  Noreene Larsson RN, MSN, [facility] Mobile: [phone]

## 2021-08-11 DIAGNOSIS — Z794 Long term (current) use of insulin: Secondary | ICD-10-CM | POA: Diagnosis not present

## 2021-08-11 DIAGNOSIS — E118 Type 2 diabetes mellitus with unspecified complications: Secondary | ICD-10-CM | POA: Diagnosis not present

## 2021-08-19 DIAGNOSIS — I2510 Atherosclerotic heart disease of native coronary artery without angina pectoris: Secondary | ICD-10-CM | POA: Diagnosis not present

## 2021-08-19 DIAGNOSIS — E785 Hyperlipidemia, unspecified: Secondary | ICD-10-CM | POA: Diagnosis not present

## 2021-08-19 DIAGNOSIS — Z5181 Encounter for therapeutic drug level monitoring: Secondary | ICD-10-CM | POA: Diagnosis not present

## 2021-08-19 DIAGNOSIS — I5032 Chronic diastolic (congestive) heart failure: Secondary | ICD-10-CM | POA: Diagnosis not present

## 2021-08-19 DIAGNOSIS — I10 Essential (primary) hypertension: Secondary | ICD-10-CM | POA: Diagnosis not present

## 2021-08-19 LAB — BASIC METABOLIC PANEL
BUN: 33 — AB (ref 4–21)
CO2: 26 — AB (ref 13–22)
Chloride: 99 (ref 99–108)
Creatinine: 1.5 — AB (ref 0.5–1.1)
Potassium: 4.9 (ref 3.4–5.3)
Sodium: 133 — AB (ref 137–147)

## 2021-08-19 LAB — LIPID PANEL
Cholesterol: 80 (ref 0–200)
HDL: 56 (ref 35–70)
LDL Cholesterol: 1
Triglycerides: 115 (ref 40–160)

## 2021-08-19 LAB — COMPREHENSIVE METABOLIC PANEL: GFR calc non Af Amer: 35

## 2021-08-21 ENCOUNTER — Other Ambulatory Visit: Payer: Self-pay | Admitting: Internal Medicine

## 2021-08-21 DIAGNOSIS — J449 Chronic obstructive pulmonary disease, unspecified: Secondary | ICD-10-CM

## 2021-08-22 NOTE — Telephone Encounter (Signed)
Requested Prescriptions  Pending Prescriptions Disp Refills  . promethazine-dextromethorphan (PROMETHAZINE-DM) 6.25-15 MG/5ML syrup [Pharmacy Med Name: PROMETHAZINE DM SYRUP] 180 mL 0    Sig: TAKE 5 ML BY MOUTH FOUR TIMES DAILY AS NEEDED FOR COUGH     Ear, Nose, and Throat:  Antitussives/Expectorants Passed - 08/21/2021 12:18 PM      Passed - Valid encounter within last 12 months    Recent Outpatient Visits          3 months ago Cellulitis of left lower extremity   [facility] [name], MD   4 months ago Localized edema   [facility] [name], MD   5 months ago Essential hypertension   [facility] [name], MD   8 months ago Lumbosacral radiculopathy at L4   [facility] [name], MD   10 months ago Trochanteric bursitis of right hip   [facility] [name], MD      Future Appointments            In 1 week Army Melia Jesse Sans, MD [facility], [place]   In 7 months Army Melia, Jesse Sans, MD [facility], [facility]

## 2021-08-25 DIAGNOSIS — I5032 Chronic diastolic (congestive) heart failure: Secondary | ICD-10-CM | POA: Diagnosis not present

## 2021-08-25 DIAGNOSIS — N1831 Chronic kidney disease, stage 3a: Secondary | ICD-10-CM

## 2021-08-25 DIAGNOSIS — I10 Essential (primary) hypertension: Secondary | ICD-10-CM

## 2021-08-25 DIAGNOSIS — E1122 Type 2 diabetes mellitus with diabetic chronic kidney disease: Secondary | ICD-10-CM | POA: Diagnosis not present

## 2021-09-01 DIAGNOSIS — H353211 Exudative age-related macular degeneration, right eye, with active choroidal neovascularization: Secondary | ICD-10-CM | POA: Diagnosis not present

## 2021-09-04 ENCOUNTER — Ambulatory Visit (INDEPENDENT_AMBULATORY_CARE_PROVIDER_SITE_OTHER): Payer: Medicare Other | Admitting: Internal Medicine

## 2021-09-04 ENCOUNTER — Other Ambulatory Visit: Payer: Self-pay

## 2021-09-04 ENCOUNTER — Encounter: Payer: Self-pay | Admitting: Internal Medicine

## 2021-09-04 ENCOUNTER — Telehealth: Payer: Self-pay | Admitting: Internal Medicine

## 2021-09-04 VITALS — BP 126/70 | HR 56 | Ht 60.0 in | Wt 121.0 lb

## 2021-09-04 DIAGNOSIS — E785 Hyperlipidemia, unspecified: Secondary | ICD-10-CM | POA: Diagnosis not present

## 2021-09-04 DIAGNOSIS — E1122 Type 2 diabetes mellitus with diabetic chronic kidney disease: Secondary | ICD-10-CM

## 2021-09-04 DIAGNOSIS — Z23 Encounter for immunization: Secondary | ICD-10-CM

## 2021-09-04 DIAGNOSIS — I10 Essential (primary) hypertension: Secondary | ICD-10-CM | POA: Diagnosis not present

## 2021-09-04 DIAGNOSIS — I5032 Chronic diastolic (congestive) heart failure: Secondary | ICD-10-CM

## 2021-09-04 DIAGNOSIS — E1169 Type 2 diabetes mellitus with other specified complication: Secondary | ICD-10-CM | POA: Diagnosis not present

## 2021-09-04 DIAGNOSIS — N1831 Chronic kidney disease, stage 3a: Secondary | ICD-10-CM | POA: Diagnosis not present

## 2021-09-04 DIAGNOSIS — F39 Unspecified mood [affective] disorder: Secondary | ICD-10-CM

## 2021-09-04 MED ORDER — SERTRALINE HCL 50 MG PO TABS: 50.0000 mg | ORAL_TABLET | Freq: Every day | ORAL | 3 refills | Status: DC

## 2021-09-04 NOTE — Telephone Encounter (Signed)
Copied from [place] [id number]. Topic: Medicare AWV >> Sep 04, 2021 10:10 AM Cher Nakai R wrote: Reason for CRM:  Left message for patient to call back and schedule Medicare Annual Wellness Visit (AWV) in office.   If unable to come into the office for AWV,  please offer to do virtually or by telephone.  Last AWV: 09/04/2020  Please schedule at anytime with [facility] Health Advisor.      40 Minutes appointment   Any questions, please call me at [phone]

## 2021-09-04 NOTE — Progress Notes (Signed)
Date:  09/04/2021   Name:  Jill Hudson   DOB:  [birth year]   MRN:  [id number]   Chief Complaint: Hypertension  Hypertension This is a chronic problem. The problem is controlled. Associated symptoms include palpitations. Pertinent negatives include no chest pain, headaches or shortness of breath. Past treatments include calcium channel blockers, angiotensin blockers and diuretics. The current treatment provides significant improvement.  Depression        This is a new problem.  The onset quality is undetermined.   The problem occurs daily.The problem is unchanged.  Associated symptoms include decreased concentration, fatigue, irritable and decreased interest.  Associated symptoms include no appetite change, no headaches and no suicidal ideas.     The symptoms are aggravated by family issues (husband on hospice; elderly cat with multiple issues).  Past treatments include nothing.  Lab Results  Component Value Date   CREATININE 1.5 (A) 08/19/2021   BUN 33 (A) 08/19/2021   NA 133 (A) 08/19/2021   K 4.9 08/19/2021   CL 99 08/19/2021   CO2 26 (A) 08/19/2021   Lab Results  Component Value Date   CHOL 80 08/19/2021   HDL 56 08/19/2021   LDLCALC 1 08/19/2021   TRIG 115 08/19/2021   CHOLHDL 2.1 02/19/2020   Lab Results  Component Value Date   TSH 3.720 12/25/2020   Lab Results  Component Value Date   HGBA1C 7.5 06/16/2021   Lab Results  Component Value Date   WBC 8.3 12/25/2020   HGB 10.7 (L) 12/25/2020   HCT 33.3 (L) 12/25/2020   MCV 92 12/25/2020   PLT 251 12/25/2020   Lab Results  Component Value Date   ALT 25 12/25/2020   AST 23 12/25/2020   ALKPHOS 107 12/25/2020   BILITOT <0.2 12/25/2020     Review of Systems  Constitutional:  Positive for fatigue. Negative for appetite change, fever and unexpected weight change.  Respiratory:  Positive for chest tightness. Negative for cough, shortness of breath and wheezing.   Cardiovascular:  Positive for palpitations and  leg swelling. Negative for chest pain.  Gastrointestinal:  Negative for abdominal pain and blood in stool.  Genitourinary:  Negative for dysuria.  Musculoskeletal:  Positive for arthralgias and gait problem.  Neurological:  Negative for dizziness, light-headedness and headaches.  Psychiatric/Behavioral:  Positive for decreased concentration and depression. Negative for suicidal ideas.    Patient Active Problem List   Diagnosis Date Noted   Acquired thrombophilia ([place]) 03/20/2021   Mood disorder ([place]) 05/02/2020   Chronic heart failure with preserved ejection fraction ([place]) 04/12/2020   Diverticulosis of large intestine without perforation or abscess with bleeding 03/27/2020   AF (paroxysmal atrial fibrillation) ([place])    Thrush    COPD (chronic obstructive pulmonary disease) with chronic bronchitis ([place])    CKD (chronic kidney disease), stage IIIa 02/18/2020   Malnutrition of mild degree ([place]) 01/08/2020   Age-related macular degeneration, dry, left eye 06/21/2019   Age-related macular degeneration, wet, right eye ([place]) 06/21/2019   Moderate nonproliferative diabetic retinopathy associated with type 2 diabetes mellitus ([place]) 06/21/2019   Lumbosacral radiculopathy at L4 05/01/2019   Underweight 05/01/2019   Encounter for long-term (current) use of aspirin 10/31/2018   Encounter for long-term (current) use of antiplatelets/antithrombotics 10/31/2018   Long term current use of oral hypoglycemic drug 10/31/2018   Encounter for long-term (current) use of insulin ([place]) 10/31/2018   GIB (gastrointestinal bleeding) 08/11/2018   Leg pain 07/03/2017   Carpal tunnel syndrome  on both sides 05/05/2017   Myalgia due to HMG CoA reductase inhibitor 05/05/2017   History of CVA (cerebrovascular accident) 02/15/2017   Degenerative disc disease, lumbar 12/21/2016   Atherosclerosis of native arteries of extremity with intermittent claudication ([place]) 12/20/2016   Bilateral carotid artery stenosis  12/20/2016   Occlusion and stenosis of bilateral carotid arteries 12/20/2016   Hip bursitis 05/18/2016   Elevated TSH 01/18/2016   CKD stage 3 due to type 2 diabetes mellitus ([place]) 01/16/2016   Senile ecchymosis 01/16/2016   Type II diabetes mellitus with renal manifestations ([place]) 09/16/2015   GERD (gastroesophageal reflux disease) 07/24/2015   PVD (peripheral vascular disease) ([place]) 05/17/2015   Neoplasm of uncertain behavior of skin 05/17/2015   Retinopathy, diabetic, proliferative ([place]) 04/11/2015   Hyperlipidemia associated with type 2 diabetes mellitus ([place]) 04/11/2015   Essential hypertension 04/11/2015   Generalized OA 04/11/2015   Proliferative diabetic retinopathy(362.02) 04/11/2015   Arteriosclerosis of coronary artery 05/29/2013   Hypertensive heart disease without CHF 05/29/2013    Allergies  Allergen Reactions   Saxagliptin Diarrhea   Epinephrine Other (See Comments)    Patient does not remember what happens when she uses this   Atorvastatin Other (See Comments)    Muscle aches   Codeine Other (See Comments)    Upset stomach   Ezetimibe Other (See Comments)    Myalgias(ZETIA)   Limonene Rash    Patient does not recall this reaction   Nitrofurantoin Rash and Other (See Comments)    Pruitus   Sulfa Antibiotics Rash and Other (See Comments)    Sore mouth     Past Surgical History:  Procedure Laterality Date   CARDIAC CATHETERIZATION  1998   40% LM, 95% Ramus interm   CATARACT EXTRACTION, BILATERAL     COLONOSCOPY WITH PROPOFOL N/A 08/12/2018   Procedure: COLONOSCOPY WITH PROPOFOL;  Surgeon: Lucilla Lame, MD;  Location: [facility] ENDOSCOPY;  Service: Endoscopy;  Laterality: N/A;   ENDARTERECTOMY FEMORAL Left 10/12/2018   Procedure: ENDARTERECTOMY FEMORAL;  Surgeon: Katha Cabal, MD;  Location: [facility] ORS;  Service: Vascular;  Laterality: Left;  angioplasty and left SFA stent placement   EYE SURGERY Bilateral    cataract extractions   LOWER EXTREMITY  ANGIOGRAPHY Left 08/23/2017   Procedure: Lower Extremity Angiography;  Surgeon: Algernon Huxley, MD;  Location: [place] CV LAB;  Service: Cardiovascular;  Laterality: Left;   LOWER EXTREMITY ANGIOGRAPHY Left 07/26/2018   Procedure: LOWER EXTREMITY ANGIOGRAPHY;  Surgeon: Katha Cabal, MD;  Location: [place] CV LAB;  Service: Cardiovascular;  Laterality: Left;   LOWER EXTREMITY ANGIOGRAPHY Left 09/16/2018   Procedure: LOWER EXTREMITY ANGIOGRAPHY;  Surgeon: Katha Cabal, MD;  Location: [place] CV LAB;  Service: Cardiovascular;  Laterality: Left;   PTCA  08/2013   Left common iliac   PTCA  12/2012   left ext iliac   RIGHT HEART CATH N/A 02/23/2020   Procedure: RIGHT HEART CATH;  Surgeon: Minna Merritts, MD;  Location: [place] CV LAB;  Service: Cardiovascular;  Laterality: N/A;   TUBAL LIGATION      Social History   Tobacco Use   Smoking status: Former    Packs/day: 2.00    Years: 37.00    Pack years: 74.00    Types: Cigarettes    Quit date: 1980    Years since quitting: 42.8   Smokeless tobacco: Never   Tobacco comments:    smoking cessation materials not required  Vaping Use   Vaping Use: Never  used  Substance Use Topics   Alcohol use: No    Alcohol/week: 0.0 standard drinks   Drug use: No     Medication list has been reviewed and updated.  Current Meds  Medication Sig   acetaminophen (TYLENOL) 650 MG CR tablet Take 1,300 mg by mouth every 8 (eight) hours as needed for pain.   albuterol (VENTOLIN HFA) 108 (90 Base) MCG/ACT inhaler Inhale 2 puffs into the lungs every 6 (six) hours as needed for wheezing or shortness of breath.   amLODipine (NORVASC) 5 MG tablet Take 5 mg by mouth in the morning and at bedtime.   B-D ULTRAFINE III SHORT PEN 31G X 8 MM MISC USE AS DIRECTED   cloNIDine (CATAPRES) 0.1 MG tablet Take 0.1 mg by mouth 2 (two) times daily as needed.   Cyanocobalamin 1000 MCG TBCR Take 1,000 mcg by mouth daily.   ELIQUIS 2.5 MG TABS  tablet TAKE 1 TABLET(2.5 MG) BY MOUTH TWICE DAILY   feeding supplement, GLUCERNA SHAKE, (GLUCERNA SHAKE) LIQD Take 237 mLs by mouth 3 (three) times daily between meals.   ferrous sulfate 325 (65 FE) MG tablet Take 325 mg by mouth daily with breakfast.   gabapentin (NEURONTIN) 100 MG capsule TAKE 1 CAPSULE(100 MG) BY MOUTH AT BEDTIME   HUMALOG KWIKPEN 100 UNIT/ML KwikPen Inject 3 Units into the skin 3 (three) times daily.   HYDROcodone-acetaminophen (NORCO/VICODIN) 5-325 MG tablet Take 1 tablet by mouth every 6 (six) hours as needed.   Insulin Glargine (BASAGLAR KWIKPEN) 100 UNIT/ML INJECT 12 UNITS UNDER THE SKIN DAILY   losartan (COZAAR) 25 MG tablet Take 25 mg by mouth 2 (two) times daily. Unsure of dosage   melatonin 5 MG TABS Take 0.5 tablets (2.5 mg total) by mouth at bedtime as needed (sleep).   Multiple Vitamins-Minerals (PRESERVISION AREDS PO) Take 1 capsule by mouth 2 (two) times daily.    ondansetron (ZOFRAN ODT) 4 MG disintegrating tablet Take 1 tablet (4 mg total) by mouth every 6 (six) hours as needed for nausea or vomiting.   REPATHA SURECLICK 194 MG/ML SOAJ Inject into the skin.   rosuvastatin (CRESTOR) 5 MG tablet Take 5 mg by mouth every other day. Mon, Wed, Fri and Sat.   torsemide (DEMADEX) 20 MG tablet Take 2 tablets (40 mg total) by mouth daily. (Patient taking differently: Take 40 mg by mouth 2 (two) times daily. 1/2 tablet morning and 1/2 tablet at night.)    PHQ 2/9 Scores 09/04/2021 05/09/2021 04/21/2021 03/20/2021  PHQ - 2 Score 6 2 3 1   PHQ- 9 Score 26 10 16 5     GAD 7 : Generalized Anxiety Score 09/04/2021 05/09/2021 04/21/2021 03/20/2021  Nervous, Anxious, on Edge 3 1 0 1  Control/stop worrying 2 1 1  0  Worry too much - different things 1 1 0 0  Trouble relaxing 3 1 0 0  Restless 2 0 0 0  Easily annoyed or irritable 3 1 0 0  Afraid - awful might happen 0 0 0 0  Total GAD 7 Score 14 5 1 1   Anxiety Difficulty Very difficult Not difficult at all - -    BP Readings  from Last 3 Encounters:  09/04/21 126/70  05/09/21 140/72  05/02/21 (!) 151/87    Physical Exam Vitals and nursing note reviewed.  Constitutional:      General: She is irritable. She is not in acute distress.    Appearance: Normal appearance. She is well-developed.  HENT:     Head:  Normocephalic and atraumatic.  Cardiovascular:     Rate and Rhythm: Normal rate and regular rhythm.     Pulses: Normal pulses.  Pulmonary:     Effort: Pulmonary effort is normal. No respiratory distress.     Breath sounds: No wheezing or rhonchi.  Musculoskeletal:     Cervical back: Normal range of motion.     Right lower leg: Edema present.     Left lower leg: Edema present.  Lymphadenopathy:     Cervical: No cervical adenopathy.  Skin:    General: Skin is warm and dry.     Capillary Refill: Capillary refill takes less than 2 seconds.     Findings: No rash.  Neurological:     General: No focal deficit present.     Mental Status: She is alert and oriented to person, place, and time.     Gait: Gait abnormal (uses cane).  Psychiatric:        Mood and Affect: Mood normal.        Behavior: Behavior normal.    Wt Readings from Last 3 Encounters:  09/04/21 121 lb (54.9 kg)  05/09/21 120 lb (54.4 kg)  05/01/21 123 lb (55.8 kg)    BP 126/70   Pulse (!) 56   Ht 5' (1.524 m)   Wt 121 lb (54.9 kg)   SpO2 97%   BMI 23.63 kg/m   Assessment and Plan: 1. Essential hypertension Clinically stable exam with well controlled BP. Tolerating medications without side effects at this time. Pt to continue current regimen and low sodium diet; benefits of regular exercise as able discussed.  2. Mood disorder ([place]) Multiple stressors at home but no SI/HI.  Appetite stable, sleep is restful. Will begin sertraline 25 mg for 6 days then 50 mg per day. Follow up in 2 months - sertraline (ZOLOFT) 50 MG tablet; Take 1 tablet (50 mg total) by mouth daily.  Dispense: 30 tablet; Refill: 3  3. Type 2 diabetes  mellitus with stage 3a chronic kidney disease, without long-term current use of insulin ([place]) Stable RI followed by Nephrology.  4. Hyperlipidemia associated with type 2 diabetes mellitus ([place]) Now on statin plus Repatha Last lipid panel reviewed - LDL <1  5. Chronic heart failure with preserved ejection fraction (HCC) Stable SOB without chest pain Continue diuretics, agree with addition of Jardiance (it may have caused diarrhea on first try - will take imodium and try it again).  6. Need for immunization against influenza - Flu Vaccine QUAD High Dose(Fluad)   Partially dictated using Editor, commissioning. Any errors are unintentional.  [name], MD [facility]  09/04/2021

## 2021-09-11 ENCOUNTER — Other Ambulatory Visit: Payer: Self-pay | Admitting: Internal Medicine

## 2021-09-11 DIAGNOSIS — M5417 Radiculopathy, lumbosacral region: Secondary | ICD-10-CM

## 2021-09-11 NOTE — Telephone Encounter (Signed)
Medication: gabapentin (NEURONTIN) 100 MG capsule [[id number]]   Has the patient contacted their pharmacy? YES Advised to contacted the office  (Agent: If no, request that the patient contact the pharmacy for the refill. If patient does not wish to contact the pharmacy document the reason why and proceed with request.) (Agent: If yes, when and what did the pharmacy advise?)  Preferred Pharmacy (with phone number or street name): [facility], [place] [address] AT [address] [place] [address] Phone: [phone] Fax: [phone] Hours: Not open 24 hours   Has the patient been seen for an appointment in the last year OR does the patient have an upcoming appointment? YES 09/04/21  Agent: Please be advised that RX refills may take up to 3 business days. We ask that you follow-up with your pharmacy.

## 2021-09-11 NOTE — Telephone Encounter (Signed)
Requested medications are due for refill today yes  Requested medications are on the active medication list yes  Last visit 09/05/21  Future visit scheduled 11/07/21  Notes to clinic this rx does not have a signature, please assess. Requested Prescriptions  Pending Prescriptions Disp Refills   gabapentin (NEURONTIN) 100 MG capsule 90 capsule 0     Neurology: Anticonvulsants - gabapentin Passed - 09/11/2021  1:48 PM      Passed - Valid encounter within last 12 months    Recent Outpatient Visits           1 week ago Essential hypertension   [facility] [name], MD   4 months ago Cellulitis of left lower extremity   [facility] [name], MD   4 months ago Localized edema   [facility] [name], MD   5 months ago Essential hypertension   [place], MD   8 months ago Lumbosacral radiculopathy at L4   [facility] [name], MD       Future Appointments             In 1 month Army Melia Jesse Sans, MD [facility], [place]   In 6 months Army Melia Jesse Sans, MD [facility], [facility]

## 2021-09-12 ENCOUNTER — Other Ambulatory Visit: Payer: Self-pay | Admitting: Internal Medicine

## 2021-09-12 DIAGNOSIS — M5417 Radiculopathy, lumbosacral region: Secondary | ICD-10-CM

## 2021-09-12 MED ORDER — GABAPENTIN 100 MG PO CAPS: 100.0000 mg | ORAL_CAPSULE | Freq: Every day | ORAL | 1 refills | Status: DC

## 2021-09-12 MED ORDER — GABAPENTIN 100 MG PO CAPS: ORAL_CAPSULE | ORAL | 1 refills | Status: DC

## 2021-09-12 MED ORDER — GABAPENTIN 100 MG PO CAPS: ORAL_CAPSULE | ORAL | 0 refills | Status: DC

## 2021-09-15 DIAGNOSIS — E118 Type 2 diabetes mellitus with unspecified complications: Secondary | ICD-10-CM | POA: Diagnosis not present

## 2021-09-15 DIAGNOSIS — Z794 Long term (current) use of insulin: Secondary | ICD-10-CM | POA: Diagnosis not present

## 2021-09-26 ENCOUNTER — Other Ambulatory Visit: Payer: Self-pay | Admitting: Internal Medicine

## 2021-09-26 DIAGNOSIS — J449 Chronic obstructive pulmonary disease, unspecified: Secondary | ICD-10-CM

## 2021-09-27 NOTE — Telephone Encounter (Signed)
Last RF 08/22/21 Dc'd 09/04/21 completed course Wyatt Haste CMA  Requested by pt  Requested Prescriptions  Pending Prescriptions Disp Refills   promethazine-dextromethorphan (PROMETHAZINE-DM) 6.25-15 MG/5ML syrup [Pharmacy Med Name: PROMETHAZINE DM SYRUP] 180 mL 0    Sig: TAKE 5 ML BY MOUTH FOUR TIMES DAILY AS NEEDED FOR COUGH     Ear, Nose, and Throat:  Antitussives/Expectorants Passed - 09/26/2021  1:44 PM      Passed - Valid encounter within last 12 months    Recent Outpatient Visits           3 weeks ago Essential hypertension   [facility] [name], MD   4 months ago Cellulitis of left lower extremity   [facility] [name], MD   5 months ago Localized edema   [facility] [name], MD   6 months ago Essential hypertension   [facility] [name], MD   9 months ago Lumbosacral radiculopathy at L4   [facility] [name], MD       Future Appointments             In 1 month Army Melia Jesse Sans, MD [facility], [place]   In 5 months Army Melia, Jesse Sans, MD [facility], [facility]

## 2021-09-29 ENCOUNTER — Other Ambulatory Visit: Payer: Self-pay | Admitting: Internal Medicine

## 2021-09-29 ENCOUNTER — Telehealth: Payer: Self-pay | Admitting: Internal Medicine

## 2021-09-29 DIAGNOSIS — H353221 Exudative age-related macular degeneration, left eye, with active choroidal neovascularization: Secondary | ICD-10-CM | POA: Diagnosis not present

## 2021-09-29 DIAGNOSIS — J449 Chronic obstructive pulmonary disease, unspecified: Secondary | ICD-10-CM

## 2021-09-29 DIAGNOSIS — E113293 Type 2 diabetes mellitus with mild nonproliferative diabetic retinopathy without macular edema, bilateral: Secondary | ICD-10-CM | POA: Diagnosis not present

## 2021-09-29 MED ORDER — PROMETHAZINE-DM 6.25-15 MG/5ML PO SYRP: ORAL_SOLUTION | ORAL | 0 refills | Status: DC

## 2021-09-29 NOTE — Telephone Encounter (Signed)
Medication Refill - Medication:  promethazine-dextromethorphan (PROMETHAZINE-DM) 6.25-15 MG/5ML syrup   Has the patient contacted their pharmacy? Yes.   Contact PCP  Preferred Pharmacy (with phone number or street name):  [id number], [place] [address] AT [place]  [place], [place] [address]  Phone:  [phone]  Fax:  [phone]   Has the patient been seen for an appointment in the last year OR does the patient have an upcoming appointment? Yes.    Agent: Please be advised that RX refills may take up to 3 business days. We ask that you follow-up with your pharmacy.

## 2021-09-29 NOTE — Telephone Encounter (Signed)
Informed Judson Roch (pts daughter) that her medication was sent in. Pt verbalized understanding.  KP

## 2021-09-29 NOTE — Telephone Encounter (Signed)
Please review.  KP

## 2021-10-10 DIAGNOSIS — H353221 Exudative age-related macular degeneration, left eye, with active choroidal neovascularization: Secondary | ICD-10-CM | POA: Diagnosis not present

## 2021-10-10 DIAGNOSIS — H353211 Exudative age-related macular degeneration, right eye, with active choroidal neovascularization: Secondary | ICD-10-CM | POA: Diagnosis not present

## 2021-10-15 ENCOUNTER — Ambulatory Visit (INDEPENDENT_AMBULATORY_CARE_PROVIDER_SITE_OTHER): Payer: Medicare Other

## 2021-10-15 DIAGNOSIS — E782 Mixed hyperlipidemia: Secondary | ICD-10-CM

## 2021-10-15 DIAGNOSIS — E1169 Type 2 diabetes mellitus with other specified complication: Secondary | ICD-10-CM

## 2021-10-15 DIAGNOSIS — Z9181 History of falling: Secondary | ICD-10-CM

## 2021-10-15 DIAGNOSIS — E1122 Type 2 diabetes mellitus with diabetic chronic kidney disease: Secondary | ICD-10-CM

## 2021-10-15 DIAGNOSIS — J441 Chronic obstructive pulmonary disease with (acute) exacerbation: Secondary | ICD-10-CM

## 2021-10-15 DIAGNOSIS — J449 Chronic obstructive pulmonary disease, unspecified: Secondary | ICD-10-CM

## 2021-10-15 DIAGNOSIS — F32A Depression, unspecified: Secondary | ICD-10-CM

## 2021-10-15 DIAGNOSIS — I10 Essential (primary) hypertension: Secondary | ICD-10-CM

## 2021-10-15 DIAGNOSIS — I5032 Chronic diastolic (congestive) heart failure: Secondary | ICD-10-CM

## 2021-10-15 NOTE — Chronic Care Management (AMB) (Signed)
Chronic Care Management   CCM RN Visit Note  10/15/2021 Name: Jill Hudson MRN: [id number] DOB: May 05, 1935  Subjective: Jill Hudson is a 86 y.o. year old female who is a primary care patient of [name], MD. The care management team was consulted for assistance with disease management and care coordination needs.    Engaged with patient by telephone for follow up visit in response to provider referral for case management and/or care coordination services.   Consent to Services:  The patient was given information about Chronic Care Management services, agreed to services, and gave verbal consent prior to initiation of services.  Please see initial visit note for detailed documentation.   Patient agreed to services and verbal consent obtained.   Assessment: Review of patient past medical history, allergies, medications, health status, including review of consultants reports, laboratory and other test data, was performed as part of comprehensive evaluation and provision of chronic care management services.   SDOH (Social Determinants of Health) assessments and interventions performed:  SDOH Interventions    Flowsheet Row Most Recent Value  SDOH Interventions   Food Insecurity Interventions Intervention Not Indicated  Financial Strain Interventions Intervention Not Indicated  Housing Interventions Intervention Not Indicated  Intimate Partner Violence Interventions Intervention Not Indicated  Physical Activity Interventions Other (Comments)  [limited mobility]  Stress Interventions Other (Comment)  [concerned about her  husband who is with Hospice in the home]  Social Connections Interventions Intervention Not Indicated  Transportation Interventions Intervention Not Indicated        CCM Care Plan  Allergies  Allergen Reactions   Saxagliptin Diarrhea   Epinephrine Other (See Comments)    Patient does not remember what happens when she uses this   Atorvastatin Other  (See Comments)    Muscle aches   Codeine Other (See Comments)    Upset stomach   Ezetimibe Other (See Comments)    Myalgias(ZETIA)   Limonene Rash    Patient does not recall this reaction   Nitrofurantoin Rash and Other (See Comments)    Pruitus   Sulfa Antibiotics Rash and Other (See Comments)    Sore mouth     Outpatient Encounter Medications as of 10/15/2021  Medication Sig Note   acetaminophen (TYLENOL) 650 MG CR tablet Take 1,300 mg by mouth every 8 (eight) hours as needed for pain.    albuterol (VENTOLIN HFA) 108 (90 Base) MCG/ACT inhaler Inhale 2 puffs into the lungs every 6 (six) hours as needed for wheezing or shortness of breath.    amLODipine (NORVASC) 5 MG tablet Take 5 mg by mouth in the morning and at bedtime.    B-D ULTRAFINE III SHORT PEN 31G X 8 MM MISC USE AS DIRECTED    cloNIDine (CATAPRES) 0.1 MG tablet Take 0.1 mg by mouth 2 (two) times daily as needed.    Cyanocobalamin 1000 MCG TBCR Take 1,000 mcg by mouth daily.    ELIQUIS 2.5 MG TABS tablet TAKE 1 TABLET(2.5 MG) BY MOUTH TWICE DAILY    feeding supplement, GLUCERNA SHAKE, (GLUCERNA SHAKE) LIQD Take 237 mLs by mouth 3 (three) times daily between meals.    ferrous sulfate 325 (65 FE) MG tablet Take 325 mg by mouth daily with breakfast.    gabapentin (NEURONTIN) 100 MG capsule Take 1 capsule (100 mg total) by mouth at bedtime.    HUMALOG KWIKPEN 100 UNIT/ML KwikPen Inject 3 Units into the skin 3 (three) times daily.    HYDROcodone-acetaminophen (NORCO/VICODIN) 5-325 MG tablet  Take 1 tablet by mouth every 6 (six) hours as needed.    Insulin Glargine (BASAGLAR KWIKPEN) 100 UNIT/ML INJECT 12 UNITS UNDER THE SKIN DAILY 02/25/2021: Taking 5 units q am    JARDIANCE 10 MG TABS tablet Take 10 mg by mouth daily. (Patient not taking: Reported on 09/04/2021)    losartan (COZAAR) 25 MG tablet Take 25 mg by mouth 2 (two) times daily. Unsure of dosage    melatonin 5 MG TABS Take 0.5 tablets (2.5 mg total) by mouth at bedtime as  needed (sleep).    Multiple Vitamins-Minerals (PRESERVISION AREDS PO) Take 1 capsule by mouth 2 (two) times daily.     ondansetron (ZOFRAN ODT) 4 MG disintegrating tablet Take 1 tablet (4 mg total) by mouth every 6 (six) hours as needed for nausea or vomiting.    promethazine-dextromethorphan (PROMETHAZINE-DM) 6.25-15 MG/5ML syrup TAKE 5 ML BY MOUTH FOUR TIMES DAILY AS NEEDED FOR COUGH    REPATHA SURECLICK 940 MG/ML SOAJ Inject into the skin.    rosuvastatin (CRESTOR) 5 MG tablet Take 5 mg by mouth every other day. Mon, Wed, Fri and Sat.    sertraline (ZOLOFT) 50 MG tablet Take 1 tablet (50 mg total) by mouth daily.    torsemide (DEMADEX) 20 MG tablet Take 2 tablets (40 mg total) by mouth daily. (Patient taking differently: Take 40 mg by mouth 2 (two) times daily. 1/2 tablet morning and 1/2 tablet at night.)    No facility-administered encounter medications on file as of 10/15/2021.    Patient Active Problem List   Diagnosis Date Noted   Acquired thrombophilia ([place]) 03/20/2021   Mood disorder ([place]) 05/02/2020   Chronic heart failure with preserved ejection fraction ([place]) 04/12/2020   Diverticulosis of large intestine without perforation or abscess with bleeding 03/27/2020   AF (paroxysmal atrial fibrillation) ([place])    Thrush    COPD (chronic obstructive pulmonary disease) with chronic bronchitis ([place])    CKD (chronic kidney disease), stage IIIa 02/18/2020   Malnutrition of mild degree ([place]) 01/08/2020   Age-related macular degeneration, dry, left eye 06/21/2019   Age-related macular degeneration, wet, right eye ([place]) 06/21/2019   Moderate nonproliferative diabetic retinopathy associated with type 2 diabetes mellitus ([place]) 06/21/2019   Lumbosacral radiculopathy at L4 05/01/2019   Underweight 05/01/2019   Encounter for long-term (current) use of aspirin 10/31/2018   Encounter for long-term (current) use of antiplatelets/antithrombotics 10/31/2018   Long term current use of oral  hypoglycemic drug 10/31/2018   Encounter for long-term (current) use of insulin ([place]) 10/31/2018   GIB (gastrointestinal bleeding) 08/11/2018   Leg pain 07/03/2017   Carpal tunnel syndrome on both sides 05/05/2017   Myalgia due to HMG CoA reductase inhibitor 05/05/2017   History of CVA (cerebrovascular accident) 02/15/2017   Degenerative disc disease, lumbar 12/21/2016   Atherosclerosis of native arteries of extremity with intermittent claudication ([place]) 12/20/2016   Bilateral carotid artery stenosis 12/20/2016   Occlusion and stenosis of bilateral carotid arteries 12/20/2016   Hip bursitis 05/18/2016   Elevated TSH 01/18/2016   CKD stage 3 due to type 2 diabetes mellitus ([place]) 01/16/2016   Senile ecchymosis 01/16/2016   Type II diabetes mellitus with renal manifestations ([place]) 09/16/2015   GERD (gastroesophageal reflux disease) 07/24/2015   PVD (peripheral vascular disease) ([place]) 05/17/2015   Neoplasm of uncertain behavior of skin 05/17/2015   Retinopathy, diabetic, proliferative ([place]) 04/11/2015   Hyperlipidemia associated with type 2 diabetes mellitus ([place]) 04/11/2015   Essential hypertension 04/11/2015   Generalized OA  04/11/2015   Proliferative diabetic retinopathy(362.02) 04/11/2015   Arteriosclerosis of coronary artery 05/29/2013   Hypertensive heart disease without CHF 05/29/2013    Conditions to be addressed/monitored:CHF, HTN, HLD, COPD, DMII, Depression, and falls prevention and safety    Care Plan : RNCM: General Plan of Care (Adult) for Chronic Disease Management and Care Coordination Needs  Updates made by [name], RN since 10/15/2021 12:00 AM     Problem: RNCM: Development of Plan of Care for Chronic Disease Management (CHF, DM, HTN, HLD, COPD, falls, depression)   Priority: High     Long-Range Goal: RNCM: Effective Management of Plan of Care for Chronic Disease Management (CHF, DM, HTN, HLD, COPD, falls, depression)   Start Date: 10/15/2021  Expected  End Date: 10/15/2022  Priority: High  Note:   Current Barriers:  Knowledge Deficits related to plan of care for management of CHF, HTN, HLD, COPD, Depression: depressed mood anxiety, and falls prevention and safety  Chronic Disease Management support and education needs related to CHF, HTN, HLD, COPD, DMII, Depression: depressed mood anxiety, and falls prevention and safety  RNCM Clinical Goal(s):  Patient will verbalize basic understanding of CHF, HTN, HLD, COPD, DMII, Depression, and Falls prevention and safety  disease process and self health management plan as evidenced by following the plan of care, taking medications as directed, following dietary restrictions, and working with the pcp and CCM team to optimize health and well being take all medications exactly as prescribed and will call provider for medication related questions as evidenced by compliance with medications and calling for refills before running out of medications    demonstrate understanding of rationale for each prescribed medication as evidenced by compliance with medications regimen    attend all scheduled medical appointments: 11-07-2021 at 240 pm as evidenced by keeping appointments and calling for schedule change needs         demonstrate improved and ongoing adherence to prescribed treatment plan for CHF, HTN, HLD, COPD, DMII, Depression, and falls prevention and safety  as evidenced by stable conditions and calling the office for questions or changes  demonstrate a decrease in CHF, HTN, HLD, COPD, DMII, Depression, and falls  exacerbations  as evidenced by stable conditions, normal VS, normal A1C, and regular outreaches to help with effective management of health and well being  demonstrate ongoing self health care management ability for effective management of chronic conditions  as evidenced by  working with the CCM team  through collaboration with Consulting civil engineer, provider, and care team.   Interventions: 1:1  collaboration with primary care provider regarding development and update of comprehensive plan of care as evidenced by provider attestation and co-signature Inter-disciplinary care team collaboration (see longitudinal plan of care) Evaluation of current treatment plan related to  self management and patient's adherence to plan as established by provider   Heart Failure Interventions:  (Status: Goal on Track (progressing): YES.)  Long Term Goal  Basic overview and discussion of pathophysiology of Heart Failure reviewed Provided education on low sodium diet Reviewed Heart Failure Action Plan in depth and provided written copy Assessed need for readable accurate scales in home Provided education about placing scale on hard, flat surface Advised patient to weigh each morning after emptying bladder Discussed importance of daily weight and advised patient to weigh and record daily Reviewed role of diuretics in prevention of fluid overload and management of heart failure Discussed the importance of keeping all appointments with provider Provided patient with education about the  role of exercise in the management of heart failure Screening for signs and symptoms of depression related to chronic disease state  Assessed social determinant of health barriers  COPD: (Status: Goal on Track (progressing): YES.) Long Term Goal  Reviewed medications with patient, including use of prescribed maintenance and rescue inhalers, and provided instruction on medication management and the importance of adherence Provided patient with basic written and verbal COPD education on self care/management/and exacerbation prevention Advised patient to track and manage COPD triggers Provided written and verbal instructions on pursed lip breathing and utilized returned demonstration as teach back Provided instruction about proper use of medications used for management of COPD including inhalers Advised patient to self  assesses COPD action plan zone and make appointment with provider if in the yellow zone for 48 hours without improvement Advised patient to engage in light exercise as tolerated 3-5 days a week to aid in the the management of COPD Provided education about and advised patient to utilize infection prevention strategies to reduce risk of respiratory infection Discussed the importance of adequate rest and management of fatigue with COPD  Diabetes:  (Status: Goal on Track (progressing): YES.) Long Term Goal   Lab Results  Component Value Date   HGBA1C 7.5 06/16/2021  Assessed patient's understanding of A1c goal: <7% Provided education to patient about basic DM disease process; Reviewed medications with patient and discussed importance of medication adherence;        Reviewed prescribed diet with patient heart healthy/ADA; Counseled on importance of regular laboratory monitoring as prescribed;        Discussed plans with patient for ongoing care management follow up and provided patient with direct contact information for care management team;      Provided patient with written educational materials related to hypo and hyperglycemia and importance of correct treatment;       Reviewed scheduled/upcoming provider appointments including: 11-07-2021 at 240 pm;         Advised patient, providing education and rationale, to check cbg before meals and at bedtime, when you have symptoms of low or high blood sugar, before and after exercise, and the patient has a continuous glucose reader   and record. 10-15-2021: The patient states that she has been having to keep it on her right arm and it is a little sore because she had a fall and had a scabbed area on her left arm. Education on rotating sites and that likely it is sore due to keeping it on the same arm. Education and support given. States her readings have been 108 to 160. Denies any real lows or real highs. Knows how to effectively manage hypoglycemic or  hyperglycemic events.       call provider for findings outside established parameters;       Review of patient status, including review of consultants reports, relevant laboratory and other test results, and medications completed;        Falls:  (Status: Goal on track: NO.) Long Term Goal  Provided written and verbal education re: potential causes of falls and Fall prevention strategies Reviewed medications and discussed potential side effects of medications such as dizziness and frequent urination Advised patient of importance of notifying provider of falls. 10-15-2021: Review and education provided  Assessed for signs and symptoms of orthostatic hypotension. 10-15-2021: Denies any issues with orthostatic hypotension at this time. Review of being safe and monitoring for blood pressure changes.  Assessed for falls since last encounter. 12-21-20[address] x 2 last week  in her home. One was when she tripped over a rug and the second time was when she was stepping up in the living room area. She has a scabbed area to her left arm and states that her left leg gave out. Education on falls prevention and safety.  Assessed patients knowledge of fall risk prevention secondary to previously provided education Provided patient information for fall alert systems. 10-15-2021: The patient is switching to [facility] coverage in January. Education provided to have the patient or patients daughter to call and see if they have a life alert system through the insurance coverage for the patient. The patient states this would give her peace of mind and be helpful if she was alone with just her and her husband at home. She will inquire about life alert system.  Assessed working status of life alert bracelet and patient adherence Advised patient to discuss call the office for recommendations when new falls occur with provider  Depression   (Status: Goal on Track (progressing): YES.) Long Term Goal  Evaluation of  current treatment plan related to Depression, Mental Health Concerns  self-management and patient's adherence to plan as established by provider. Discussed plans with patient for ongoing care management follow up and provided patient with direct contact information for care management team Advised patient to call the office for changes in mood, anxiety, or depression ; Provided education to patient re: being around positive people, doing things she enjoys, talking to friends and family on the phone and utilization of resources when feeling down and depressed; Reviewed scheduled/upcoming provider appointments including 11-07-2021 at 240 pm; Discussed plans with patient for ongoing care management follow up and provided patient with direct contact information for care management team; Advised patient to discuss new concerns about depression or questions related to mental health with provider;  Hyperlipidemia:  (Status: Goal on Track (progressing): YES.) Long Term Goal  Lab Results  Component Value Date   CHOL 80 08/19/2021   HDL 56 08/19/2021   [place] 1 08/19/2021   TRIG 115 08/19/2021   CHOLHDL 2.1 02/19/2020     Medication review performed; medication list updated in electronic medical record.  Provider established cholesterol goals reviewed; Counseled on importance of regular laboratory monitoring as prescribed; Provided HLD educational materials; Reviewed role and benefits of statin for ASCVD risk reduction; Discussed strategies to manage statin-induced myalgias; Reviewed importance of limiting foods high in cholesterol; Reviewed exercise goals and target of 150 minutes per week;  Hypertension: (Status: Goal on Track (progressing): YES.) Last practice recorded BP readings:  BP Readings from Last 3 Encounters:  09/04/21 126/70  05/09/21 140/72  05/02/21 (!) 151/87  Most recent eGFR/CrCl:  Lab Results  Component Value Date   EGFR 30 (L) 12/25/2020    No components found for:  CRCL  Evaluation of current treatment plan related to hypertension self management and patient's adherence to plan as established by provider;   Provided education to patient re: stroke prevention, s/s of heart attack and stroke; Reviewed prescribed diet heart healthy/ADA  Reviewed medications with patient and discussed importance of compliance;  Discussed plans with patient for ongoing care management follow up and provided patient with direct contact information for care management team; Advised patient, providing education and rationale, to monitor blood pressure daily and record, calling PCP for findings outside established parameters;  Advised patient to discuss changes in blood pressure trends  with provider; Provided education on prescribed diet heart healthy/ADA diet ;  Discussed complications of poorly controlled  blood pressure such as heart disease, stroke, circulatory complications, vision complications, kidney impairment, sexual dysfunction;   Patient Goals/Self-Care Activities: Take medications as prescribed   Attend all scheduled provider appointments Call pharmacy for medication refills 3-7 days in advance of running out of medications Attend church or other social activities Perform all self care activities independently  Perform IADL's (shopping, preparing meals, housekeeping, managing finances) independently Call provider office for new concerns or questions  Work with the social worker to address care coordination needs and will continue to work with the clinical team to address health care and disease management related needs call the Suicide and Crisis Lifeline: 988 call the [place] National Suicide Prevention Lifeline: 575-625-4526 or TTY: 305 537 2961 TTY 407-062-1422) to talk to a trained counselor call 1-800-273-TALK (toll free, 24 hour hotline) if experiencing a Mental Health or [place]  call office if I gain more than 2 pounds in one day or 5 pounds  in one week keep legs up while sitting meet with dietitian track weight in diary use salt in moderation watch for swelling in feet, ankles and legs every day weigh myself daily begin a heart failure diary bring diary to all appointments develop a rescue plan follow rescue plan if symptoms flare-up eat more whole grains, fruits and vegetables, lean meats and healthy fats schedule appointment with eye doctor check blood sugar at prescribed times: before meals and at bedtime, when you have symptoms of low or high blood sugar, and before and after exercise check feet daily for cuts, sores or redness enter blood sugar readings and medication or insulin into daily log take the blood sugar log to all doctor visits take the blood sugar meter to all doctor visits set goal weight trim toenails straight across drink 6 to 8 glasses of water each day fill half of plate with vegetables limit fast food meals to no more than 1 per week manage portion size prepare main meal at home 3 to 5 days each week read food labels for fat, fiber, carbohydrates and portion size reduce red meat to 2 to 3 times a week keep feet up while sitting wash and dry feet carefully every day wear comfortable, cotton socks wear comfortable, well-fitting shoes - avoid second hand smoke - eliminate smoking in my home - identify and avoid work-related triggers - identify and remove indoor air pollutants - limit outdoor activity during cold weather - listen for public air quality announcements every day - do breathing exercises every day - develop a rescue plan - eliminate symptom triggers at home - follow rescue plan if symptoms flare-up - keep follow-up appointments: 11-07-2021 at 240 pm  - use an extra pillow to sleep - develop a new routine to improve sleep - don't eat or exercise right before bedtime - get at least 7 to 8 hours of sleep at night - use devices that will help like a cane, sock-puller or reacher -  practice relaxation or meditation daily - do exercises in a comfortable position that makes breathing as easy as possible check blood pressure 3 times per week choose a place to take my blood pressure (home, clinic or office, retail store) write blood pressure results in a log or diary learn about high blood pressure keep a blood pressure log take blood pressure log to all doctor appointments call doctor for signs and symptoms of high blood pressure develop an action plan for high blood pressure keep all doctor appointments take medications for blood pressure exactly as  prescribed report new symptoms to your doctor eat more whole grains, fruits and vegetables, lean meats and healthy fats - call for medicine refill 2 or 3 days before it runs out - take all medications exactly as prescribed - call doctor with any symptoms you believe are related to your medicine - call doctor when you experience any new symptoms - go to all doctor appointments as scheduled - adhere to prescribed diet: heart healthy/ADA       Plan:Telephone follow up appointment with care management team member scheduled for:  February 2023  Noreene Larsson RN, MSN, [facility] Mobile: [phone]

## 2021-10-15 NOTE — Patient Instructions (Signed)
Visit Information  Thank you for taking time to visit with me today. Please don't hesitate to contact me if I can be of assistance to you before our next scheduled telephone appointment.  Following are the goals we discussed today:  RNCM Clinical Goal(s):  Patient will verbalize basic understanding of CHF, HTN, HLD, COPD, DMII, Depression, and Falls prevention and safety  disease process and self health management plan as evidenced by following the plan of care, taking medications as directed, following dietary restrictions, and working with the pcp and CCM team to optimize health and well being take all medications exactly as prescribed and will call provider for medication related questions as evidenced by compliance with medications and calling for refills before running out of medications    demonstrate understanding of rationale for each prescribed medication as evidenced by compliance with medications regimen    attend all scheduled medical appointments: 11-07-2021 at 240 pm as evidenced by keeping appointments and calling for schedule change needs         demonstrate improved and ongoing adherence to prescribed treatment plan for CHF, HTN, HLD, COPD, DMII, Depression, and falls prevention and safety  as evidenced by stable conditions and calling the office for questions or changes  demonstrate a decrease in CHF, HTN, HLD, COPD, DMII, Depression, and falls  exacerbations  as evidenced by stable conditions, normal VS, normal A1C, and regular outreaches to help with effective management of health and well being  demonstrate ongoing self health care management ability for effective management of chronic conditions  as evidenced by  working with the CCM team  through collaboration with Consulting civil engineer, provider, and care team.    Interventions: 1:1 collaboration with primary care provider regarding development and update of comprehensive plan of care as evidenced by provider attestation and  co-signature Inter-disciplinary care team collaboration (see longitudinal plan of care) Evaluation of current treatment plan related to  self management and patient's adherence to plan as established by provider     Heart Failure Interventions:  (Status: Goal on Track (progressing): YES.)  Long Term Goal  Basic overview and discussion of pathophysiology of Heart Failure reviewed Provided education on low sodium diet Reviewed Heart Failure Action Plan in depth and provided written copy Assessed need for readable accurate scales in home Provided education about placing scale on hard, flat surface Advised patient to weigh each morning after emptying bladder Discussed importance of daily weight and advised patient to weigh and record daily Reviewed role of diuretics in prevention of fluid overload and management of heart failure Discussed the importance of keeping all appointments with provider Provided patient with education about the role of exercise in the management of heart failure Screening for signs and symptoms of depression related to chronic disease state  Assessed social determinant of health barriers   COPD: (Status: Goal on Track (progressing): YES.) Long Term Goal  Reviewed medications with patient, including use of prescribed maintenance and rescue inhalers, and provided instruction on medication management and the importance of adherence Provided patient with basic written and verbal COPD education on self care/management/and exacerbation prevention Advised patient to track and manage COPD triggers Provided written and verbal instructions on pursed lip breathing and utilized returned demonstration as teach back Provided instruction about proper use of medications used for management of COPD including inhalers Advised patient to self assesses COPD action plan zone and make appointment with provider if in the yellow zone for 48 hours without improvement Advised patient to engage  in light exercise as tolerated 3-5 days a week to aid in the the management of COPD Provided education about and advised patient to utilize infection prevention strategies to reduce risk of respiratory infection Discussed the importance of adequate rest and management of fatigue with COPD   Diabetes:  (Status: Goal on Track (progressing): YES.) Long Term Goal         Lab Results  Component Value Date    HGBA1C 7.5 06/16/2021  Assessed patient's understanding of A1c goal: <7% Provided education to patient about basic DM disease process; Reviewed medications with patient and discussed importance of medication adherence;        Reviewed prescribed diet with patient heart healthy/ADA; Counseled on importance of regular laboratory monitoring as prescribed;        Discussed plans with patient for ongoing care management follow up and provided patient with direct contact information for care management team;      Provided patient with written educational materials related to hypo and hyperglycemia and importance of correct treatment;       Reviewed scheduled/upcoming provider appointments including: 11-07-2021 at 240 pm;         Advised patient, providing education and rationale, to check cbg before meals and at bedtime, when you have symptoms of low or high blood sugar, before and after exercise, and the patient has a continuous glucose reader   and record. 10-15-2021: The patient states that she has been having to keep it on her right arm and it is a little sore because she had a fall and had a scabbed area on her left arm. Education on rotating sites and that likely it is sore due to keeping it on the same arm. Education and support given. States her readings have been 108 to 160. Denies any real lows or real highs. Knows how to effectively manage hypoglycemic or hyperglycemic events.       call provider for findings outside established parameters;       Review of patient status, including review of  consultants reports, relevant laboratory and other test results, and medications completed;         Falls:  (Status: Goal on track: NO.) Long Term Goal  Provided written and verbal education re: potential causes of falls and Fall prevention strategies Reviewed medications and discussed potential side effects of medications such as dizziness and frequent urination Advised patient of importance of notifying provider of falls. 10-15-2021: Review and education provided  Assessed for signs and symptoms of orthostatic hypotension. 10-15-2021: Denies any issues with orthostatic hypotension at this time. Review of being safe and monitoring for blood pressure changes.  Assessed for falls since last encounter. 12-21-[address] x 2 last week in her home. One was when she tripped over a rug and the second time was when she was stepping up in the living room area. She has a scabbed area to her left arm and states that her left leg gave out. Education on falls prevention and safety.  Assessed patients knowledge of fall risk prevention secondary to previously provided education Provided patient information for fall alert systems. 10-15-2021: The patient is switching to [facility] coverage in January. Education provided to have the patient or patients daughter to call and see if they have a life alert system through the insurance coverage for the patient. The patient states this would give her peace of mind and be helpful if she was alone with just her and her husband at home. She will inquire  about life alert system.  Assessed working status of life alert bracelet and patient adherence Advised patient to discuss call the office for recommendations when new falls occur with provider   Depression   (Status: Goal on Track (progressing): YES.) Long Term Goal  Evaluation of current treatment plan related to Depression, Mental Health Concerns  self-management and patient's adherence to plan as established by  provider. Discussed plans with patient for ongoing care management follow up and provided patient with direct contact information for care management team Advised patient to call the office for changes in mood, anxiety, or depression ; Provided education to patient re: being around positive people, doing things she enjoys, talking to friends and family on the phone and utilization of resources when feeling down and depressed; Reviewed scheduled/upcoming provider appointments including 11-07-2021 at 240 pm; Discussed plans with patient for ongoing care management follow up and provided patient with direct contact information for care management team; Advised patient to discuss new concerns about depression or questions related to mental health with provider;   Hyperlipidemia:  (Status: Goal on Track (progressing): YES.) Long Term Goal       Lab Results  Component Value Date    CHOL 80 08/19/2021    HDL 56 08/19/2021    Prentiss 1 08/19/2021    TRIG 115 08/19/2021    CHOLHDL 2.1 02/19/2020      Medication review performed; medication list updated in electronic medical record.  Provider established cholesterol goals reviewed; Counseled on importance of regular laboratory monitoring as prescribed; Provided HLD educational materials; Reviewed role and benefits of statin for ASCVD risk reduction; Discussed strategies to manage statin-induced myalgias; Reviewed importance of limiting foods high in cholesterol; Reviewed exercise goals and target of 150 minutes per week;   Hypertension: (Status: Goal on Track (progressing): YES.) Last practice recorded BP readings:     BP Readings from Last 3 Encounters:  09/04/21 126/70  05/09/21 140/72  05/02/21 (!) 151/87  Most recent eGFR/CrCl:       Lab Results  Component Value Date    EGFR 30 (L) 12/25/2020    No components found for: CRCL   Evaluation of current treatment plan related to hypertension self management and patient's adherence to  plan as established by provider;   Provided education to patient re: stroke prevention, s/s of heart attack and stroke; Reviewed prescribed diet heart healthy/ADA  Reviewed medications with patient and discussed importance of compliance;  Discussed plans with patient for ongoing care management follow up and provided patient with direct contact information for care management team; Advised patient, providing education and rationale, to monitor blood pressure daily and record, calling PCP for findings outside established parameters;  Advised patient to discuss changes in blood pressure trends  with provider; Provided education on prescribed diet heart healthy/ADA diet ;  Discussed complications of poorly controlled blood pressure such as heart disease, stroke, circulatory complications, vision complications, kidney impairment, sexual dysfunction;    Patient Goals/Self-Care Activities: Take medications as prescribed   Attend all scheduled provider appointments Call pharmacy for medication refills 3-7 days in advance of running out of medications Attend church or other social activities Perform all self care activities independently  Perform IADL's (shopping, preparing meals, housekeeping, managing finances) independently Call provider office for new concerns or questions  Work with the social worker to address care coordination needs and will continue to work with the clinical team to address health care and disease management related needs call the Suicide and Crisis Lifeline:  988 call the [place] National Suicide Prevention Lifeline: 209-221-2478 or TTY: 702-512-1607 TTY (937)506-4882) to talk to a trained counselor call 1-800-273-TALK (toll free, 24 hour hotline) if experiencing a Mental Health or [place]  call office if I gain more than 2 pounds in one day or 5 pounds in one week keep legs up while sitting meet with dietitian track weight in diary use salt in  moderation watch for swelling in feet, ankles and legs every day weigh myself daily begin a heart failure diary bring diary to all appointments develop a rescue plan follow rescue plan if symptoms flare-up eat more whole grains, fruits and vegetables, lean meats and healthy fats schedule appointment with eye doctor check blood sugar at prescribed times: before meals and at bedtime, when you have symptoms of low or high blood sugar, and before and after exercise check feet daily for cuts, sores or redness enter blood sugar readings and medication or insulin into daily log take the blood sugar log to all doctor visits take the blood sugar meter to all doctor visits set goal weight trim toenails straight across drink 6 to 8 glasses of water each day fill half of plate with vegetables limit fast food meals to no more than 1 per week manage portion size prepare main meal at home 3 to 5 days each week read food labels for fat, fiber, carbohydrates and portion size reduce red meat to 2 to 3 times a week keep feet up while sitting wash and dry feet carefully every day wear comfortable, cotton socks wear comfortable, well-fitting shoes - avoid second hand smoke - eliminate smoking in my home - identify and avoid work-related triggers - identify and remove indoor air pollutants - limit outdoor activity during cold weather - listen for public air quality announcements every day - do breathing exercises every day - develop a rescue plan - eliminate symptom triggers at home - follow rescue plan if symptoms flare-up - keep follow-up appointments: 11-07-2021 at 240 pm  - use an extra pillow to sleep - develop a new routine to improve sleep - don't eat or exercise right before bedtime - get at least 7 to 8 hours of sleep at night - use devices that will help like a cane, sock-puller or reacher - practice relaxation or meditation daily - do exercises in a comfortable position that makes  breathing as easy as possible check blood pressure 3 times per week choose a place to take my blood pressure (home, clinic or office, retail store) write blood pressure results in a log or diary learn about high blood pressure keep a blood pressure log take blood pressure log to all doctor appointments call doctor for signs and symptoms of high blood pressure develop an action plan for high blood pressure keep all doctor appointments take medications for blood pressure exactly as prescribed report new symptoms to your doctor eat more whole grains, fruits and vegetables, lean meats and healthy fats - call for medicine refill 2 or 3 days before it runs out - take all medications exactly as prescribed - call doctor with any symptoms you believe are related to your medicine - call doctor when you experience any new symptoms - go to all doctor appointments as scheduled - adhere to prescribed diet: heart healthy/ADA    Our next appointment is by telephone in February 2023  Please call the care guide team at [phone] if you need to cancel or reschedule your appointment.   If you  are experiencing a Mental Health or [place] or need someone to talk to, please call the Suicide and Crisis Lifeline: 988 call the [place] National Suicide Prevention Lifeline: 724-832-9637 or TTY: 2534807852 TTY 407 809 1624) to talk to a trained counselor call 1-800-273-TALK (toll free, 24 hour hotline)   Patient verbalizes understanding of instructions provided today and agrees to view in [place].   Noreene Larsson RN, MSN, [facility] Mobile: [phone]

## 2021-10-25 DIAGNOSIS — E785 Hyperlipidemia, unspecified: Secondary | ICD-10-CM

## 2021-10-25 DIAGNOSIS — N1831 Chronic kidney disease, stage 3a: Secondary | ICD-10-CM

## 2021-10-25 DIAGNOSIS — E782 Mixed hyperlipidemia: Secondary | ICD-10-CM

## 2021-10-25 DIAGNOSIS — J441 Chronic obstructive pulmonary disease with (acute) exacerbation: Secondary | ICD-10-CM

## 2021-10-25 DIAGNOSIS — I10 Essential (primary) hypertension: Secondary | ICD-10-CM

## 2021-10-25 DIAGNOSIS — J449 Chronic obstructive pulmonary disease, unspecified: Secondary | ICD-10-CM

## 2021-10-25 DIAGNOSIS — E1169 Type 2 diabetes mellitus with other specified complication: Secondary | ICD-10-CM

## 2021-10-25 DIAGNOSIS — F32A Depression, unspecified: Secondary | ICD-10-CM

## 2021-10-25 DIAGNOSIS — I5032 Chronic diastolic (congestive) heart failure: Secondary | ICD-10-CM

## 2021-10-25 DIAGNOSIS — E1122 Type 2 diabetes mellitus with diabetic chronic kidney disease: Secondary | ICD-10-CM

## 2021-10-31 ENCOUNTER — Ambulatory Visit: Payer: Self-pay | Admitting: *Deleted

## 2021-10-31 ENCOUNTER — Ambulatory Visit (INDEPENDENT_AMBULATORY_CARE_PROVIDER_SITE_OTHER): Payer: Medicare HMO

## 2021-10-31 ENCOUNTER — Other Ambulatory Visit: Payer: Self-pay

## 2021-10-31 ENCOUNTER — Ambulatory Visit
Admission: EM | Admit: 2021-10-31 | Discharge: 2021-10-31 | Disposition: A | Discharge status: Home / Self Care | Payer: Medicare HMO | Attending: Medical Oncology | Admitting: Medical Oncology

## 2021-10-31 DIAGNOSIS — E1169 Type 2 diabetes mellitus with other specified complication: Secondary | ICD-10-CM | POA: Diagnosis not present

## 2021-10-31 DIAGNOSIS — E1142 Type 2 diabetes mellitus with diabetic polyneuropathy: Secondary | ICD-10-CM | POA: Diagnosis not present

## 2021-10-31 DIAGNOSIS — M25552 Pain in left hip: Secondary | ICD-10-CM

## 2021-10-31 DIAGNOSIS — I152 Hypertension secondary to endocrine disorders: Secondary | ICD-10-CM | POA: Diagnosis not present

## 2021-10-31 DIAGNOSIS — E785 Hyperlipidemia, unspecified: Secondary | ICD-10-CM | POA: Diagnosis not present

## 2021-10-31 DIAGNOSIS — E1159 Type 2 diabetes mellitus with other circulatory complications: Secondary | ICD-10-CM | POA: Diagnosis not present

## 2021-10-31 DIAGNOSIS — Z794 Long term (current) use of insulin: Secondary | ICD-10-CM | POA: Diagnosis not present

## 2021-10-31 LAB — HEMOGLOBIN A1C: Hemoglobin A1C: 7.4

## 2021-10-31 NOTE — ED Provider Notes (Signed)
[facility]    CSN: [id number] Arrival date & time: 10/31/21  1603      History   Chief Complaint Chief Complaint  Patient presents with   Leg Injury    HPI Jill Hudson is a 87 y.o. female.   HPI  Hip Pain: Patient presents with her daughter.  Patient states that she was "getting frisky this morning "and she swung her left leg up high.  She states that after this she had fairly significant left hip pain.  Mostly on the outer side of her hip.  She reports that walking significantly worsens the pain.  She has tried Tylenol and her normal gabapentin which has helped some.  She denies any new neuro changes, weakness, bruising.   Past Medical History:  Diagnosis Date   Acute CHF (congestive heart failure) ([place]) 5/99/3570   Acute diastolic CHF (congestive heart failure) (HCC)    Acute kidney injury superimposed on CKD (HCC)    Allergies    Anxiety    Arthritis    spine and shoulder   Atherosclerosis of artery of extremity with rest pain ([place]) 10/12/2018   Cancer (HCC)    skin   CHF (congestive heart failure) ([place])    Diabetes mellitus without complication ([place])    Diverticulosis of large intestine with hemorrhage    GI bleeding 12/25/2019   Heart murmur    Hyperlipidemia    Hypertension    Macula lutea degeneration    Mitral and aortic valve disease    Myocardial infarction ([place])    may have had a "light" heart attack   Non-ST elevation (NSTEMI) myocardial infarction (HCC)    Occasional tremors    PAD (peripheral artery disease) (HCC)    Rectal bleeding    Shingles    patient unaware but daughter confirms. it was a long time ago   Stroke [facility]) 01/2017   may have had a slight stroke   TIA (transient ischemic attack) 01/2017   UTI (urinary tract infection)    Vascular disease, peripheral ([place])     Patient Active Problem List   Diagnosis Date Noted   Acquired thrombophilia ([place]) 03/20/2021   Mood disorder ([place]) 05/02/2020   Chronic heart failure with  preserved ejection fraction ([place]) 04/12/2020   Diverticulosis of large intestine without perforation or abscess with bleeding 03/27/2020   AF (paroxysmal atrial fibrillation) ([place])    Thrush    COPD (chronic obstructive pulmonary disease) with chronic bronchitis ([place])    CKD (chronic kidney disease), stage IIIa 02/18/2020   Malnutrition of mild degree ([place]) 01/08/2020   Age-related macular degeneration, dry, left eye 06/21/2019   Age-related macular degeneration, wet, right eye ([place]) 06/21/2019   Moderate nonproliferative diabetic retinopathy associated with type 2 diabetes mellitus ([place]) 06/21/2019   Lumbosacral radiculopathy at L4 05/01/2019   Underweight 05/01/2019   Encounter for long-term (current) use of aspirin 10/31/2018   Encounter for long-term (current) use of antiplatelets/antithrombotics 10/31/2018   Long term current use of oral hypoglycemic drug 10/31/2018   Encounter for long-term (current) use of insulin ([place]) 10/31/2018   GIB (gastrointestinal bleeding) 08/11/2018   Leg pain 07/03/2017   Carpal tunnel syndrome on both sides 05/05/2017   Myalgia due to HMG CoA reductase inhibitor 05/05/2017   History of CVA (cerebrovascular accident) 02/15/2017   Degenerative disc disease, lumbar 12/21/2016   Atherosclerosis of native arteries of extremity with intermittent claudication ([place]) 12/20/2016   Bilateral carotid artery stenosis 12/20/2016   Occlusion and stenosis of  bilateral carotid arteries 12/20/2016   Hip bursitis 05/18/2016   Elevated TSH 01/18/2016   CKD stage 3 due to type 2 diabetes mellitus ([place]) 01/16/2016   Senile ecchymosis 01/16/2016   Type II diabetes mellitus with renal manifestations ([place]) 09/16/2015   GERD (gastroesophageal reflux disease) 07/24/2015   PVD (peripheral vascular disease) ([place]) 05/17/2015   Neoplasm of uncertain behavior of skin 05/17/2015   Retinopathy, diabetic, proliferative ([place]) 04/11/2015   Hyperlipidemia associated with type 2  diabetes mellitus ([place]) 04/11/2015   Essential hypertension 04/11/2015   Generalized OA 04/11/2015   Proliferative diabetic retinopathy(362.02) 04/11/2015   Arteriosclerosis of coronary artery 05/29/2013   Hypertensive heart disease without CHF 05/29/2013    Past Surgical History:  Procedure Laterality Date   CARDIAC CATHETERIZATION  1998   40% LM, 95% Ramus interm   CATARACT EXTRACTION, BILATERAL     COLONOSCOPY WITH PROPOFOL N/A 08/12/2018   Procedure: COLONOSCOPY WITH PROPOFOL;  Surgeon: Lucilla Lame, MD;  Location: [facility]COPY;  Service: Endoscopy;  Laterality: N/A;   ENDARTERECTOMY FEMORAL Left 10/12/2018   Procedure: ENDARTERECTOMY FEMORAL;  Surgeon: Katha Cabal, MD;  Location: [facility] ORS;  Service: Vascular;  Laterality: Left;  angioplasty and left SFA stent placement   EYE SURGERY Bilateral    cataract extractions   LOWER EXTREMITY ANGIOGRAPHY Left 08/23/2017   Procedure: Lower Extremity Angiography;  Surgeon: Algernon Huxley, MD;  Location: [facility];  Service: Cardiovascular;  Laterality: Left;   LOWER EXTREMITY ANGIOGRAPHY Left 07/26/2018   Procedure: LOWER EXTREMITY ANGIOGRAPHY;  Surgeon: Katha Cabal, MD;  Location: [facility];  Service: Cardiovascular;  Laterality: Left;   LOWER EXTREMITY ANGIOGRAPHY Left 09/16/2018   Procedure: LOWER EXTREMITY ANGIOGRAPHY;  Surgeon: Katha Cabal, MD;  Location: [facility];  Service: Cardiovascular;  Laterality: Left;   PTCA  08/2013   Left common iliac   PTCA  12/2012   left ext iliac   RIGHT HEART CATH N/A 02/23/2020   Procedure: RIGHT HEART CATH;  Surgeon: Minna Merritts, MD;  Location: [facility];  Service: Cardiovascular;  Laterality: N/A;   TUBAL LIGATION      OB History   No obstetric history on file.      Home Medications    Prior to Admission medications   Medication Sig Start Date End Date Taking? Authorizing Provider  acetaminophen (TYLENOL) 650 MG CR  tablet Take 1,300 mg by mouth every 8 (eight) hours as needed for pain.   Yes [provider]  albuterol (VENTOLIN HFA) 108 (90 Base) MCG/ACT inhaler Inhale 2 puffs into the lungs every 6 (six) hours as needed for wheezing or shortness of breath. 02/26/20  Yes Wieting, Richard, MD  amLODipine (NORVASC) 5 MG tablet Take 5 mg by mouth in the morning and at bedtime. 09/03/20  Yes [provider]  B-D ULTRAFINE III SHORT PEN 31G X 8 MM MISC USE AS DIRECTED 03/26/20  Yes [name], MD  cloNIDine (CATAPRES) 0.1 MG tablet Take 0.1 mg by mouth 2 (two) times daily as needed. 08/19/21  Yes [provider]  Cyanocobalamin 1000 MCG TBCR Take 1,000 mcg by mouth daily.   Yes [provider]  ELIQUIS 2.5 MG TABS tablet TAKE 1 TABLET(2.5 MG) BY MOUTH TWICE DAILY 04/15/21  Yes [name], MD  feeding supplement, GLUCERNA SHAKE, (GLUCERNA SHAKE) LIQD Take 237 mLs by mouth 3 (three) times daily between meals. 12/28/19  Yes Loletha Grayer, MD  ferrous sulfate 325 (  65 FE) MG tablet Take 325 mg by mouth daily with breakfast.   Yes [provider]  gabapentin (NEURONTIN) 100 MG capsule Take 1 capsule (100 mg total) by mouth at bedtime. 09/12/21  Yes [name], MD  HUMALOG KWIKPEN 100 UNIT/ML KwikPen Inject 3 Units into the skin 3 (three) times daily. 04/09/21  Yes [provider]  Insulin Glargine (BASAGLAR KWIKPEN) 100 UNIT/ML INJECT 12 UNITS UNDER THE SKIN DAILY 09/22/20  Yes [name], MD  JARDIANCE 10 MG TABS tablet Take 10 mg by mouth daily. 06/16/21  Yes [provider]  losartan (COZAAR) 25 MG tablet Take 25 mg by mouth 2 (two) times daily. Unsure of dosage   Yes [provider]  melatonin 5 MG TABS Take 0.5 tablets (2.5 mg total) by mouth at bedtime as needed (sleep). 02/26/20  Yes Wieting, Richard, MD  Multiple Vitamins-Minerals (PRESERVISION AREDS PO) Take 1 capsule by mouth 2 (two) times daily.    Yes [provider]  ondansetron (ZOFRAN ODT) 4 MG disintegrating tablet Take 1 tablet (4 mg total) by mouth every 6 (six) hours as needed for nausea or vomiting. 05/02/21  Yes Ward, Kristen N, DO  promethazine-dextromethorphan (PROMETHAZINE-DM) 6.25-15 MG/5ML syrup TAKE 5 ML BY MOUTH FOUR TIMES DAILY AS NEEDED FOR COUGH 09/29/21  Yes [name], MD  REPATHA SURECLICK 147 MG/ML SOAJ Inject into the skin. 08/12/21  Yes [provider]  rosuvastatin (CRESTOR) 5 MG tablet Take 5 mg by mouth every other day. Mon, Wed, Fri and Sat. 03/02/18  Yes [provider]  sertraline (ZOLOFT) 50 MG tablet Take 1 tablet (50 mg total) by mouth daily. 09/04/21  Yes [name], MD  torsemide (DEMADEX) 20 MG tablet Take 2 tablets (40 mg total) by mouth daily. Patient taking differently: Take 40 mg by mouth 2 (two) times daily. 1/2 tablet morning and 1/2 tablet at night. 02/27/20  Yes Wieting, Richard, MD  HYDROcodone-acetaminophen (NORCO/VICODIN) 5-325 MG tablet Take 1 tablet by mouth every 6 (six) hours as needed. 05/02/21   Ward, Delice Bison, DO    Family History Family History  Problem Relation Age of Onset   Dementia Mother    Diabetes Father     Social History Social History   Tobacco Use   Smoking status: Former    Packs/day: 2.00    Years: 37.00    Pack years: 74.00    Types: Cigarettes    Quit date: 1980    Years since quitting: 43.0   Smokeless tobacco: Never   Tobacco comments:    smoking cessation materials not required  Vaping Use   Vaping Use: Never used  Substance Use Topics   Alcohol use: No    Alcohol/week: 0.0 standard drinks   Drug use: No     Allergies   Saxagliptin, Epinephrine, Atorvastatin, Codeine, Ezetimibe, Limonene, Nitrofurantoin, and Sulfa antibiotics   Review of Systems Review of Systems  As stated above in HPI Physical Exam Triage Vital Signs ED Triage Vitals  Enc Vitals Group     BP 10/31/21 1616 (!) 158/103     Pulse Rate 10/31/21 1616  77     Resp 10/31/21 1616 18     Temp 10/31/21 1616 98.1 F (36.7 C)     Temp Source 10/31/21 1616 Oral     SpO2 10/31/21 1616 97 %     Weight 10/31/21 1613 121 lb (54.9 kg)     Height 10/31/21 1613 5\' 2"  (1.575 m)  Head Circumference --      Peak Flow --      Pain Score 10/31/21 1612 9     Pain Loc --      Pain Edu? --      Excl. in [place]? --    No data found.  Updated Vital Signs BP (!) 158/103 (BP Location: Left Arm)    Pulse 77    Temp 98.1 F (36.7 C) (Oral)    Resp 18    Ht 5\' 2"  (1.575 m)    Wt 121 lb (54.9 kg)    SpO2 97%    BMI 22.13 kg/m   Physical Exam Vitals and nursing note reviewed.  Constitutional:      General: She is not in acute distress.    Appearance: Normal appearance. She is not ill-appearing, toxic-appearing or diaphoretic.  HENT:     Head: Normocephalic and atraumatic.  Musculoskeletal:        General: Tenderness (mild lateral) present. No swelling, deformity or signs of injury. Normal range of motion.     Right lower leg: No edema.     Left lower leg: No edema.  Skin:    General: Skin is warm.  Neurological:     General: No focal deficit present.     Mental Status: She is alert.     Sensory: No sensory deficit.     Motor: No weakness.     Coordination: Coordination normal.     Deep Tendon Reflexes: Reflexes normal.     UC Treatments / Results  Labs (all labs ordered are listed, but only abnormal results are displayed) Labs Reviewed - No data to display  EKG   Radiology No results found.  Procedures Procedures (including critical care time)  Medications Ordered in UC Medications - No data to display  Initial Impression / Assessment and Plan / UC Course  I have reviewed the triage vital signs and the nursing notes.  Pertinent labs & imaging results that were available during my care of the patient were reviewed by me and considered in my medical decision making (see chart for details).     New. Appears MSK in nature. X ray is  negative for acute abnormality. We discussed heating pad, gentle massage and tylenol. Discussed red flag signs and symptoms.  Final Clinical Impressions(s) / UC Diagnoses   Final diagnoses:  Left hip pain   Discharge Instructions   None    ED Prescriptions   None    PDMP not reviewed this encounter.   Hughie Closs, [place] 10/31/21 1736

## 2021-10-31 NOTE — Telephone Encounter (Signed)
Reason for Disposition  Can't stand (bear weight) or walk  Answer Assessment - Initial Assessment Questions 1. LOCATION and RADIATION: "Where is the pain located?"      Daughter calling in.   Pt got out of bed she felt something pinch from lower back to her leg and it's sore to touch.   She has a pinched nerve.  She is scheduled for an injection on Monday.   Pt requesting an x ray.  She walks stooped over with a walker.  We tried heat.   The walking stooped is not new. 2. QUALITY: "What does the pain feel like?"  (e.g., sharp, dull, aching, burning)     Pain is shooting down left leg. 3. SEVERITY: "How bad is the pain?" "What does it keep you from doing?"   (Scale 1-10; or mild, moderate, severe)   -  MILD (1-3): doesn't interfere with normal activities    -  MODERATE (4-7): interferes with normal activities (e.g., work or school) or awakens from sleep, limping    -  SEVERE (8-10): excruciating pain, unable to do any normal activities, unable to walk     *No Answer* 4. ONSET: "When did the pain start?" "Does it come and go, or is it there all the time?"     This morning 5. WORK OR EXERCISE: "Has there been any recent work or exercise that involved this part of the body?"      *No Answer* 6. CAUSE: "What do you think is causing the hip pain?"      *No Answer* 7. AGGRAVATING FACTORS: "What makes the hip pain worse?" (e.g., walking, climbing stairs, running)     *No Answer* 8. OTHER SYMPTOMS: "Do you have any other symptoms?" (e.g., back pain, pain shooting down leg,  fever, rash)     *No Answer*  Protocols used: Hip Pain-A-AH  Chief Complaint: hip pain radiating down left leg Symptoms: Felt a pop in her back this morning getting out of bed.  "I think I have broke my hip"  She's crying in pain Frequency: all day Pertinent Negatives: Patient denies N/A Disposition: [x] ED /[] Urgent Care (no appt availability in office) / [] Appointment(In office/virtual)/ []  [facility]/ [] Home  Care/ [] Refused Recommended Disposition /[] [place] [facility]/ []  Follow-up with PCP Additional Notes:  Daughter called in but pt in the background crying in pain.

## 2021-10-31 NOTE — Discharge Instructions (Addendum)
Heating pad, tylenol

## 2021-10-31 NOTE — ED Triage Notes (Signed)
Pt was "getting Jill Hudson" this morning and believes that she broke her left hip or threw it out of joint. Pt states that she felt it when walking. The pain was along the left thigh and radiates up to the left hip. Pt fell 3 weeks ago and has a knot on her left knee.

## 2021-11-03 DIAGNOSIS — M5416 Radiculopathy, lumbar region: Secondary | ICD-10-CM | POA: Diagnosis not present

## 2021-11-03 DIAGNOSIS — M5126 Other intervertebral disc displacement, lumbar region: Secondary | ICD-10-CM | POA: Diagnosis not present

## 2021-11-05 ENCOUNTER — Ambulatory Visit: Payer: Self-pay | Admitting: *Deleted

## 2021-11-05 NOTE — Telephone Encounter (Signed)
°  Chief Complaint: broken skin- 2 areas, blister area Symptoms: pain Frequency: started Sunday Pertinent Negatives: Patient denies fever- bleeding Disposition: [] ED /[] Urgent Care (no appt availability in office) / [x] Appointment(In office/virtual)/ []  [facility]/ [] Home Care/ [] Refused Recommended Disposition /[] [place] [facility]/ []  Follow-up with PCP Additional Notes:     Reason for Disposition  [1] Looks infected (spreading redness, pus) AND [2] no fever    Broken skin in 2 places with blister in middle  Answer Assessment - Initial Assessment Questions 1. APPEARANCE of INJURY: "What does the injury look like?"      Broken skin- nickel size, blister- quarter size 2. SIZE: "How large is the cut?"      Nickel, quarter size 3. BLEEDING: "Is it bleeding now?" If Yes, ask: "Is it difficult to stop?"      Oozing- but not bleeding 4. LOCATION: "Where is the injury located?"      Left buttock 5. ONSET: "How long ago did the injury occur?"      Sunday 6. MECHANISM: "Tell me how it happened."      Skin tear from bandage- blister- possible adhesive/medictaion 7. TETANUS: "When was the last tetanus booster?"     *No Answer* 8. PREGNANCY: "Is there any chance you are pregnant?" "When was your last menstrual period?"     *No Answer*  Protocols used: Skin Injury-A-AH

## 2021-11-06 ENCOUNTER — Other Ambulatory Visit: Payer: Self-pay

## 2021-11-06 ENCOUNTER — Ambulatory Visit (INDEPENDENT_AMBULATORY_CARE_PROVIDER_SITE_OTHER): Payer: Medicare HMO | Admitting: Internal Medicine

## 2021-11-06 ENCOUNTER — Encounter: Payer: Self-pay | Admitting: Internal Medicine

## 2021-11-06 VITALS — BP 132/78 | HR 76 | Ht 62.0 in | Wt 120.0 lb

## 2021-11-06 DIAGNOSIS — L03116 Cellulitis of left lower limb: Secondary | ICD-10-CM

## 2021-11-06 DIAGNOSIS — M5417 Radiculopathy, lumbosacral region: Secondary | ICD-10-CM | POA: Diagnosis not present

## 2021-11-06 MED ORDER — SILVER SULFADIAZINE 1 % EX CREA: 1.0000 "application " | TOPICAL_CREAM | Freq: Every day | CUTANEOUS | 1 refills | Status: DC

## 2021-11-06 MED ORDER — AMOXICILLIN-POT CLAVULANATE 875-125 MG PO TABS: 1.0000 | ORAL_TABLET | Freq: Two times a day (BID) | ORAL | 0 refills | Status: AC

## 2021-11-06 NOTE — Progress Notes (Signed)
Date:  11/06/2021   Name:  Jill Hudson   DOB:  1935-07-12   MRN:  [id number]   Chief Complaint: Sore (Sore's on buttocks. X2 days. Painful. Oozing.)  HPI Skin wound - she used a heat wrap on her lower back and when she went to take it off it hung up on her clothes and stuck to her thigh.  When she pulled it off the skin came off as well, leaving an open wound.  It has now developed a large blister as well. Back pain - inflamed the left sciatic nerve recently.  Had an injection several days ago but it has not helped much.  The heat wrap was for that reason.  She wants to do PT and that can be considered after she has her follow up with Ortho/pain management.  Lab Results  Component Value Date   NA 133 (A) 08/19/2021   K 4.9 08/19/2021   CO2 26 (A) 08/19/2021   GLUCOSE 299 (H) 12/25/2020   BUN 33 (A) 08/19/2021   CREATININE 1.5 (A) 08/19/2021   CALCIUM 9.5 12/25/2020   EGFR 30 (L) 12/25/2020   GFRNONAA 35 08/19/2021   Lab Results  Component Value Date   CHOL 80 08/19/2021   HDL 56 08/19/2021   LDLCALC 1 08/19/2021   TRIG 115 08/19/2021   CHOLHDL 2.1 02/19/2020   Lab Results  Component Value Date   TSH 3.720 12/25/2020   Lab Results  Component Value Date   HGBA1C 7.5 06/16/2021   Lab Results  Component Value Date   WBC 8.3 12/25/2020   HGB 10.7 (L) 12/25/2020   HCT 33.3 (L) 12/25/2020   MCV 92 12/25/2020   PLT 251 12/25/2020   Lab Results  Component Value Date   ALT 25 12/25/2020   AST 23 12/25/2020   ALKPHOS 107 12/25/2020   BILITOT <0.2 12/25/2020   No results found for: 25OHVITD2, 25OHVITD3, VD25OH   Review of Systems  Constitutional:  Negative for chills, fatigue and fever.  Respiratory:  Negative for chest tightness and shortness of breath.   Cardiovascular:  Negative for chest pain.  Musculoskeletal:  Positive for back pain and gait problem.  Skin:  Positive for wound.   Patient Active Problem List   Diagnosis Date Noted   Acquired thrombophilia  ([place]) 03/20/2021   Mood disorder ([place]) 05/02/2020   Chronic heart failure with preserved ejection fraction ([place]) 04/12/2020   Diverticulosis of large intestine without perforation or abscess with bleeding 03/27/2020   AF (paroxysmal atrial fibrillation) ([place])    Thrush    COPD (chronic obstructive pulmonary disease) with chronic bronchitis ([place])    CKD (chronic kidney disease), stage IIIa 02/18/2020   Malnutrition of mild degree ([place]) 01/08/2020   Age-related macular degeneration, dry, left eye 06/21/2019   Age-related macular degeneration, wet, right eye ([place]) 06/21/2019   Moderate nonproliferative diabetic retinopathy associated with type 2 diabetes mellitus ([place]) 06/21/2019   Lumbosacral radiculopathy at L4 05/01/2019   Underweight 05/01/2019   Encounter for long-term (current) use of aspirin 10/31/2018   Encounter for long-term (current) use of antiplatelets/antithrombotics 10/31/2018   Long term current use of oral hypoglycemic drug 10/31/2018   Encounter for long-term (current) use of insulin ([place]) 10/31/2018   GIB (gastrointestinal bleeding) 08/11/2018   Leg pain 07/03/2017   Carpal tunnel syndrome on both sides 05/05/2017   Myalgia due to HMG CoA reductase inhibitor 05/05/2017   History of CVA (cerebrovascular accident) 02/15/2017   Degenerative disc disease, lumbar  12/21/2016   Atherosclerosis of native arteries of extremity with intermittent claudication ([place]) 12/20/2016   Bilateral carotid artery stenosis 12/20/2016   Occlusion and stenosis of bilateral carotid arteries 12/20/2016   Hip bursitis 05/18/2016   Elevated TSH 01/18/2016   CKD stage 3 due to type 2 diabetes mellitus ([place]) 01/16/2016   Senile ecchymosis 01/16/2016   Type II diabetes mellitus with renal manifestations ([place]) 09/16/2015   GERD (gastroesophageal reflux disease) 07/24/2015   PVD (peripheral vascular disease) ([place]) 05/17/2015   Neoplasm of uncertain behavior of skin 05/17/2015   Retinopathy,  diabetic, proliferative ([place]) 04/11/2015   Hyperlipidemia associated with type 2 diabetes mellitus ([place]) 04/11/2015   Essential hypertension 04/11/2015   Generalized OA 04/11/2015   Proliferative diabetic retinopathy(362.02) 04/11/2015   Arteriosclerosis of coronary artery 05/29/2013   Hypertensive heart disease without CHF 05/29/2013    Allergies  Allergen Reactions   Saxagliptin Diarrhea   Epinephrine Other (See Comments)    Patient does not remember what happens when she uses this   Atorvastatin Other (See Comments)    Muscle aches   Codeine Other (See Comments)    Upset stomach   Ezetimibe Other (See Comments)    Myalgias(ZETIA)   Limonene Rash    Patient does not recall this reaction   Nitrofurantoin Rash and Other (See Comments)    Pruitus   Sulfa Antibiotics Rash and Other (See Comments)    Sore mouth     Past Surgical History:  Procedure Laterality Date   CARDIAC CATHETERIZATION  1998   40% LM, 95% Ramus interm   CATARACT EXTRACTION, BILATERAL     COLONOSCOPY WITH PROPOFOL N/A 08/12/2018   Procedure: COLONOSCOPY WITH PROPOFOL;  Surgeon: Lucilla Lame, MD;  Location: [facility] ENDOSCOPY;  Service: Endoscopy;  Laterality: N/A;   ENDARTERECTOMY FEMORAL Left 10/12/2018   Procedure: ENDARTERECTOMY FEMORAL;  Surgeon: Katha Cabal, MD;  Location: [facility] ORS;  Service: Vascular;  Laterality: Left;  angioplasty and left SFA stent placement   EYE SURGERY Bilateral    cataract extractions   LOWER EXTREMITY ANGIOGRAPHY Left 08/23/2017   Procedure: Lower Extremity Angiography;  Surgeon: Algernon Huxley, MD;  Location: [place] [facility];  Service: Cardiovascular;  Laterality: Left;   LOWER EXTREMITY ANGIOGRAPHY Left 07/26/2018   Procedure: LOWER EXTREMITY ANGIOGRAPHY;  Surgeon: Katha Cabal, MD;  Location: [place] [facility];  Service: Cardiovascular;  Laterality: Left;   LOWER EXTREMITY ANGIOGRAPHY Left 09/16/2018   Procedure: LOWER EXTREMITY ANGIOGRAPHY;  Surgeon:  Katha Cabal, MD;  Location: [place] [facility];  Service: Cardiovascular;  Laterality: Left;   PTCA  08/2013   Left common iliac   PTCA  12/2012   left ext iliac   RIGHT HEART CATH N/A 02/23/2020   Procedure: RIGHT HEART CATH;  Surgeon: Minna Merritts, MD;  Location: [name] [facility];  Service: Cardiovascular;  Laterality: N/A;   TUBAL LIGATION      Social History   Tobacco Use   Smoking status: Former    Packs/day: 2.00    Years: 37.00    Pack years: 74.00    Types: Cigarettes    Quit date: 1980    Years since quitting: 43.0   Smokeless tobacco: Never   Tobacco comments:    smoking cessation materials not required  Vaping Use   Vaping Use: Never used  Substance Use Topics   Alcohol use: No    Alcohol/week: 0.0 standard drinks   Drug use: No     Medication list  has been reviewed and updated.  Current Meds  Medication Sig   acetaminophen (TYLENOL) 650 MG CR tablet Take 1,300 mg by mouth every 8 (eight) hours as needed for pain.   albuterol (VENTOLIN HFA) 108 (90 Base) MCG/ACT inhaler Inhale 2 puffs into the lungs every 6 (six) hours as needed for wheezing or shortness of breath.   amLODipine (NORVASC) 5 MG tablet Take 5 mg by mouth in the morning and at bedtime.   B-D ULTRAFINE III SHORT PEN 31G X 8 MM MISC USE AS DIRECTED   cloNIDine (CATAPRES) 0.1 MG tablet Take 0.1 mg by mouth 2 (two) times daily as needed.   Cyanocobalamin 1000 MCG TBCR Take 1,000 mcg by mouth daily.   ELIQUIS 2.5 MG TABS tablet TAKE 1 TABLET(2.5 MG) BY MOUTH TWICE DAILY   feeding supplement, GLUCERNA SHAKE, (GLUCERNA SHAKE) LIQD Take 237 mLs by mouth 3 (three) times daily between meals.   ferrous sulfate 325 (65 FE) MG tablet Take 325 mg by mouth daily with breakfast.   gabapentin (NEURONTIN) 100 MG capsule Take 1 capsule (100 mg total) by mouth at bedtime.   HUMALOG KWIKPEN 100 UNIT/ML KwikPen Inject 3 Units into the skin 3 (three) times daily.   HYDROcodone-acetaminophen  (NORCO/VICODIN) 5-325 MG tablet Take 1 tablet by mouth every 6 (six) hours as needed.   Insulin Glargine (BASAGLAR KWIKPEN) 100 UNIT/ML INJECT 12 UNITS UNDER THE SKIN DAILY   JARDIANCE 10 MG TABS tablet Take 10 mg by mouth daily.   losartan (COZAAR) 25 MG tablet Take 25 mg by mouth 2 (two) times daily. Unsure of dosage   melatonin 5 MG TABS Take 0.5 tablets (2.5 mg total) by mouth at bedtime as needed (sleep).   Multiple Vitamins-Minerals (PRESERVISION AREDS PO) Take 1 capsule by mouth 2 (two) times daily.    ondansetron (ZOFRAN ODT) 4 MG disintegrating tablet Take 1 tablet (4 mg total) by mouth every 6 (six) hours as needed for nausea or vomiting.   REPATHA SURECLICK 967 MG/ML SOAJ Inject into the skin.   sertraline (ZOLOFT) 50 MG tablet Take 1 tablet (50 mg total) by mouth daily.   torsemide (DEMADEX) 20 MG tablet Take 2 tablets (40 mg total) by mouth daily. (Patient taking differently: Take 40 mg by mouth 2 (two) times daily. 1/2 tablet morning and 1/2 tablet at night.)    PHQ 2/9 Scores 11/06/2021 09/04/2021 05/09/2021 04/21/2021  PHQ - 2 Score '5 6 2 3  ' PHQ- 9 Score '26 26 10 16    ' GAD 7 : Generalized Anxiety Score 11/06/2021 09/04/2021 05/09/2021 04/21/2021  Nervous, Anxious, on Edge '2 3 1 ' 0  Control/stop worrying '3 2 1 1  ' Worry too much - different things '3 1 1 ' 0  Trouble relaxing 0 3 1 0  Restless 0 2 0 0  Easily annoyed or irritable '3 3 1 ' 0  Afraid - awful might happen 3 0 0 0  Total GAD 7 Score '14 14 5 1  ' Anxiety Difficulty Somewhat difficult Very difficult Not difficult at all -    BP Readings from Last 3 Encounters:  11/06/21 132/78  10/31/21 (!) 158/103  09/04/21 126/70    Physical Exam Vitals and nursing note reviewed.  Constitutional:      General: She is not in acute distress.    Appearance: She is well-developed.  HENT:     Head: Normocephalic and atraumatic.  Cardiovascular:     Rate and Rhythm: Normal rate and regular rhythm.  Pulmonary:  Effort: Pulmonary  effort is normal. No respiratory distress.     Breath sounds: No wheezing or rhonchi.  Skin:    General: Skin is warm and dry.     Findings: Erythema and wound present. No rash.          Comments: Two shallow ulcers/denuded skin with clean pink base. Large bulla - soft with serous fluid; surrounding skin erythematous.  Neurological:     Mental Status: She is alert and oriented to person, place, and time.  Psychiatric:        Mood and Affect: Mood normal.        Behavior: Behavior normal.    Wt Readings from Last 3 Encounters:  11/06/21 120 lb (54.4 kg)  10/31/21 121 lb (54.9 kg)  09/04/21 121 lb (54.9 kg)    BP 132/78    Pulse 76    Ht '5\' 2"'  (1.575 m)    Wt 120 lb (54.4 kg)    SpO2 95%    BMI 21.95 kg/m   Assessment and Plan: 1. Cellulitis of left lower extremity Recommend local care, Silvadene and cover with Telfa Follow up in 5 days if not improving - silver sulfADIAZINE (SILVADENE) 1 % cream; Apply 1 application topically daily. To wound on leg  Dispense: 50 g; Refill: 1 - amoxicillin-clavulanate (AUGMENTIN) 875-125 MG tablet; Take 1 tablet by mouth 2 (two) times daily for 10 days.  Dispense: 20 tablet; Refill: 0  2. Lumbosacral radiculopathy at L4 Follow up with Ortho Continue gabapentin - call for Rx with change in directions to bid if needed   Partially dictated using Dragon software. Any errors are unintentional.  [name], MD [facility]  11/06/2021

## 2021-11-07 ENCOUNTER — Ambulatory Visit: Payer: Medicare HMO | Admitting: Internal Medicine

## 2021-11-24 ENCOUNTER — Other Ambulatory Visit: Payer: Self-pay | Admitting: Physical Medicine and Rehabilitation

## 2021-11-24 DIAGNOSIS — M5416 Radiculopathy, lumbar region: Secondary | ICD-10-CM | POA: Diagnosis not present

## 2021-11-24 DIAGNOSIS — M5136 Other intervertebral disc degeneration, lumbar region: Secondary | ICD-10-CM | POA: Diagnosis not present

## 2021-11-24 DIAGNOSIS — M4807 Spinal stenosis, lumbosacral region: Secondary | ICD-10-CM | POA: Diagnosis not present

## 2021-11-24 DIAGNOSIS — M5126 Other intervertebral disc displacement, lumbar region: Secondary | ICD-10-CM | POA: Diagnosis not present

## 2021-11-24 DIAGNOSIS — M62830 Muscle spasm of back: Secondary | ICD-10-CM | POA: Diagnosis not present

## 2021-11-24 DIAGNOSIS — M48062 Spinal stenosis, lumbar region with neurogenic claudication: Secondary | ICD-10-CM | POA: Diagnosis not present

## 2021-12-01 ENCOUNTER — Encounter (INDEPENDENT_AMBULATORY_CARE_PROVIDER_SITE_OTHER): Payer: Self-pay | Admitting: Vascular Surgery

## 2021-12-01 ENCOUNTER — Ambulatory Visit (INDEPENDENT_AMBULATORY_CARE_PROVIDER_SITE_OTHER): Payer: Medicare HMO | Admitting: Vascular Surgery

## 2021-12-01 ENCOUNTER — Other Ambulatory Visit: Payer: Self-pay

## 2021-12-01 ENCOUNTER — Ambulatory Visit (INDEPENDENT_AMBULATORY_CARE_PROVIDER_SITE_OTHER): Payer: Medicare HMO

## 2021-12-01 VITALS — BP 125/63 | HR 81 | Ht 61.0 in | Wt 117.0 lb

## 2021-12-01 DIAGNOSIS — I480 Paroxysmal atrial fibrillation: Secondary | ICD-10-CM | POA: Diagnosis not present

## 2021-12-01 DIAGNOSIS — I70212 Atherosclerosis of native arteries of extremities with intermittent claudication, left leg: Secondary | ICD-10-CM

## 2021-12-01 DIAGNOSIS — I10 Essential (primary) hypertension: Secondary | ICD-10-CM

## 2021-12-01 DIAGNOSIS — E1122 Type 2 diabetes mellitus with diabetic chronic kidney disease: Secondary | ICD-10-CM

## 2021-12-01 DIAGNOSIS — N1831 Chronic kidney disease, stage 3a: Secondary | ICD-10-CM | POA: Diagnosis not present

## 2021-12-01 DIAGNOSIS — I739 Peripheral vascular disease, unspecified: Secondary | ICD-10-CM | POA: Diagnosis not present

## 2021-12-01 DIAGNOSIS — I6523 Occlusion and stenosis of bilateral carotid arteries: Secondary | ICD-10-CM

## 2021-12-01 DIAGNOSIS — I2510 Atherosclerotic heart disease of native coronary artery without angina pectoris: Secondary | ICD-10-CM

## 2021-12-01 LAB — HM DIABETES EYE EXAM

## 2021-12-01 NOTE — Progress Notes (Signed)
MRN : [id number]  Jill Hudson is a 87 y.o. ([birth year]) female who presents with chief complaint of check circulation.  History of Present Illness:   The patient returns to the office for followup and review of the noninvasive studies. There have been no interval changes in lower extremity symptoms. No interval shortening of the patient's claudication distance or development of rest pain symptoms. No new ulcers or wounds have occurred since the last visit.  Procedure 10/12/2018: Redo left common femoral, superficial femoral and profunda femoris endarterectomy with Cormatrix patch angioplasty Left superficial femoral and above-knee popliteal artery angioplasty and stent placement   There have been no significant changes to the patient's overall health care.   The patient denies amaurosis fugax or recent TIA symptoms. There are no recent neurological changes noted. The patient denies history of DVT, PE or superficial thrombophlebitis. The patient denies recent episodes of angina or shortness of breath.    ABI Rt=[name] and Lt=1.16; TBI's Rt=0.69 and LT=0.69  (previous ABI's Rt=[name] and Lt=[name];  TBI's Rt=0.66 and Lt=0.75)  Previous duplex ultrasound of the carotid arteries demonstrates 40 to 59% diameter stenosis bilateral internal carotid arteries  No outpatient medications have been marked as taking for the 12/01/21 encounter (Appointment) with [name], Dolores Lory, MD.    Past Medical History:  Diagnosis Date   Acute CHF (congestive heart failure) ([place]) 05/20/3663   Acute diastolic CHF (congestive heart failure) ([place])    Acute kidney injury superimposed on CKD (HCC)    Allergies    Anxiety    Arthritis    spine and shoulder   Atherosclerosis of artery of extremity with rest pain ([place]) 10/12/2018   Cancer ([place])    skin   CHF (congestive heart failure) ([place])    Diabetes mellitus without complication ([place])    Diverticulosis of large intestine with hemorrhage    GI bleeding 12/25/2019    Heart murmur    Hyperlipidemia    Hypertension    Macula lutea degeneration    Mitral and aortic valve disease    Myocardial infarction ([place])    may have had a "light" heart attack   Non-ST elevation (NSTEMI) myocardial infarction (HCC)    Occasional tremors    PAD (peripheral artery disease) (HCC)    Rectal bleeding    Shingles    patient unaware but daughter confirms. it was a long time ago   Stroke [facility]) 01/2017   may have had a slight stroke   TIA (transient ischemic attack) 01/2017   UTI (urinary tract infection)    Vascular disease, peripheral ([place])     Past Surgical History:  Procedure Laterality Date   CARDIAC CATHETERIZATION  1998   40% LM, 95% Ramus interm   CATARACT EXTRACTION, BILATERAL     COLONOSCOPY WITH PROPOFOL N/A 08/12/2018   Procedure: COLONOSCOPY WITH PROPOFOL;  Surgeon: Lucilla Lame, MD;  Location: [facility] ENDOSCOPY;  Service: Endoscopy;  Laterality: N/A;   ENDARTERECTOMY FEMORAL Left 10/12/2018   Procedure: ENDARTERECTOMY FEMORAL;  Surgeon: Katha Cabal, MD;  Location: [facility] ORS;  Service: Vascular;  Laterality: Left;  angioplasty and left SFA stent placement   EYE SURGERY Bilateral    cataract extractions   LOWER EXTREMITY ANGIOGRAPHY Left 08/23/2017   Procedure: Lower Extremity Angiography;  Surgeon: Algernon Huxley, MD;  Location: [place] [facility];  Service: Cardiovascular;  Laterality: Left;   LOWER EXTREMITY ANGIOGRAPHY Left 07/26/2018   Procedure: LOWER EXTREMITY ANGIOGRAPHY;  Surgeon: Katha Cabal, MD;  [facility]  INVASIVE [facility] LAB;  Service: Cardiovascular;  Laterality: Left;   LOWER EXTREMITY ANGIOGRAPHY Left 09/16/2018   Procedure: LOWER EXTREMITY ANGIOGRAPHY;  Surgeon: Katha Cabal, MD;  Location: [place] [facility];  Service: Cardiovascular;  Laterality: Left;   PTCA  08/2013   Left common iliac   PTCA  12/2012   left ext iliac   RIGHT HEART CATH N/A 02/23/2020   Procedure: RIGHT HEART CATH;  Surgeon: Minna Merritts, MD;   Location: [place] [facility];  Service: Cardiovascular;  Laterality: N/A;   TUBAL LIGATION      Social History Social History   Tobacco Use   Smoking status: Former    Packs/day: 2.00    Years: 37.00    Pack years: 74.00    Types: Cigarettes    Quit date: 1980    Years since quitting: 43.1   Smokeless tobacco: Never   Tobacco comments:    smoking cessation materials not required  Vaping Use   Vaping Use: Never used  Substance Use Topics   Alcohol use: No    Alcohol/week: 0.0 standard drinks   Drug use: No    Family History Family History  Problem Relation Age of Onset   Dementia Mother    Diabetes Father     Allergies  Allergen Reactions   Saxagliptin Diarrhea   Epinephrine Other (See Comments)    Patient does not remember what happens when she uses this   Atorvastatin Other (See Comments)    Muscle aches   Codeine Other (See Comments)    Upset stomach   Ezetimibe Other (See Comments)    Myalgias(ZETIA)   Limonene Rash    Patient does not recall this reaction   Nitrofurantoin Rash and Other (See Comments)    Pruitus   Sulfa Antibiotics Rash and Other (See Comments)    Sore mouth      REVIEW OF SYSTEMS (Negative unless checked)  Constitutional: [] Weight loss  [] Fever  [] Chills Cardiac: [] Chest pain   [] Chest pressure   [] Palpitations   [] Shortness of breath when laying flat   [] Shortness of breath with exertion. Vascular:  [] Pain in legs with walking   [] Pain in legs at rest  [] History of DVT   [] Phlebitis   [] Swelling in legs   [] Varicose veins   [] Non-healing ulcers Pulmonary:   [] Uses home oxygen   [] Productive cough   [] Hemoptysis   [] Wheeze  [] COPD   [] Asthma Neurologic:  [] Dizziness   [] Seizures   [] History of stroke   [] History of TIA  [] Aphasia   [] Vissual changes   [] Weakness or numbness in arm   [] Weakness or numbness in leg Musculoskeletal:   [] Joint swelling   [] Joint pain   [] Low back pain Hematologic:  [] Easy bruising  [] Easy bleeding    [] Hypercoagulable state   [] Anemic Gastrointestinal:  [] Diarrhea   [] Vomiting  [] Gastroesophageal reflux/heartburn   [] Difficulty swallowing. Genitourinary:  [] Chronic kidney disease   [] Difficult urination  [] Frequent urination   [] Blood in urine Skin:  [] Rashes   [] Ulcers  Psychological:  [] History of anxiety   []  History of major depression.  Physical Examination  There were no vitals filed for this visit. There is no height or weight on file to calculate BMI. Gen: WD/WN, NAD Head: [place]/AT, No temporalis wasting.  Ear/Nose/Throat: Hearing grossly intact, nares w/o erythema or drainage Eyes: PER, EOMI, sclera nonicteric.  Neck: Supple, no masses.  No bruit or JVD.  Pulmonary:  Good air movement, no audible wheezing, no use of  accessory muscles.  Cardiac: RRR, normal S1, S2, no Murmurs. Vascular:   Vessel Right Left  Radial Palpable Palpable  Carotid Palpable Palpable  PT Not Palpable Not Palpable  DP Not Palpable Not Palpable  Gastrointestinal: soft, non-distended. No guarding/no peritoneal signs.  Musculoskeletal: M/S 5/5 throughout.  No visible deformity.  Neurologic: CN 2-12 intact. Pain and light touch intact in extremities.  Symmetrical.  Speech is fluent. Motor exam as listed above. Psychiatric: Judgment intact, Mood & affect appropriate for pt's clinical situation. Dermatologic: No rashes or ulcers noted.  No changes consistent with cellulitis.   CBC Lab Results  Component Value Date   WBC 8.3 12/25/2020   HGB 10.7 (L) 12/25/2020   HCT 33.3 (L) 12/25/2020   MCV 92 12/25/2020   PLT 251 12/25/2020    BMET    Component Value Date/Time   NA 133 (A) 08/19/2021 0000   NA 138 05/29/2014 1202   K 4.9 08/19/2021 0000   K 5.1 05/29/2014 1202   CL 99 08/19/2021 0000   CL 106 05/29/2014 1202   CO2 26 (A) 08/19/2021 0000   CO2 25 05/29/2014 1202   GLUCOSE 299 (H) 12/25/2020 1427   GLUCOSE 206 (H) 07/20/2020 1727   GLUCOSE 269 (H) 05/29/2014 1202   BUN 33 (A) 08/19/2021  0000   BUN 19 (H) 05/29/2014 1202   CREATININE 1.5 (A) 08/19/2021 0000   CREATININE 1.65 (H) 12/25/2020 1427   CREATININE 1.10 05/29/2014 1202   CALCIUM 9.5 12/25/2020 1427   CALCIUM 9.1 05/29/2014 1202   GFRNONAA 35 08/19/2021 0000   GFRNONAA 48 (L) 05/29/2014 1202   GFRAA 39 (L) 07/20/2020 1727   GFRAA 56 (L) 05/29/2014 1202   CrCl cannot be calculated (Patient's most recent lab result is older than the maximum 21 days allowed.).  COAG Lab Results  Component Value Date   INR 1.1 07/20/2020   INR 1.2 02/20/2020   INR 1.1 12/25/2019    Radiology No results found.   Assessment/Plan 1. Atherosclerosis of native artery of left lower extremity with intermittent claudication (HCC) Recommend:   The patient has evidence of atherosclerosis of the lower extremities with claudication.  The patient does not voice lifestyle limiting changes at this point in time.   Noninvasive studies do not suggest clinically significant change.   No invasive studies, angiography or surgery at this time The patient should continue walking and begin a more formal exercise program.  The patient should continue antiplatelet therapy and aggressive treatment of the lipid abnormalities   No changes in the patient's medications at this time   The patient should continue wearing graduated compression socks 10-15 mmHg strength to control the mild edema.   - VAS [place] ABI WITH/WO TBI; Future  2. Bilateral carotid artery stenosis Recommend:   Given the patient's asymptomatic subcritical stenosis no further invasive testing or surgery at this time.   Duplex ultrasound shows 40-59% stenosis bilaterally.   Continue antiplatelet therapy as prescribed Continue management of CAD, HTN and Hyperlipidemia Healthy heart diet,  encouraged exercise at least 4 times per week Follow up in 6 months with duplex ultrasound and physical exam   - VAS US CAROTID; Future  3. AF (paroxysmal atrial fibrillation)  (HCC) Continue antiarrhythmia medications as already ordered, these medications have been reviewed and there are no changes at this time.  Continue anticoagulation as ordered by Cardiology Service   4. Arteriosclerosis of coronary artery Continue cardiac and antihypertensive medications as already ordered and reviewed, no changes at  this time.  Continue statin as ordered and reviewed, no changes at this time  Nitrates PRN for chest pain   5. Essential hypertension Continue antihypertensive medications as already ordered, these medications have been reviewed and there are no changes at this time.   6. Type 2 diabetes mellitus with stage 3a chronic kidney disease, without long-term current use of insulin (HCC) Continue hypoglycemic medications as already ordered, these medications have been reviewed and there are no changes at this time.  Hgb A1C to be monitored as already arranged by primary service      Hortencia Pilar, MD  12/01/2021 11:00 AM

## 2021-12-03 ENCOUNTER — Ambulatory Visit
Admission: RE | Admit: 2021-12-03 | Discharge: 2021-12-03 | Disposition: A | Discharge status: Home / Self Care | Payer: Medicare HMO | Source: Ambulatory Visit | Attending: Physical Medicine and Rehabilitation | Admitting: Physical Medicine and Rehabilitation

## 2021-12-03 ENCOUNTER — Other Ambulatory Visit: Payer: Self-pay

## 2021-12-03 DIAGNOSIS — M5416 Radiculopathy, lumbar region: Secondary | ICD-10-CM

## 2021-12-03 DIAGNOSIS — M545 Low back pain, unspecified: Secondary | ICD-10-CM | POA: Diagnosis not present

## 2021-12-04 ENCOUNTER — Ambulatory Visit (INDEPENDENT_AMBULATORY_CARE_PROVIDER_SITE_OTHER): Payer: Medicare HMO

## 2021-12-04 DIAGNOSIS — M5417 Radiculopathy, lumbosacral region: Secondary | ICD-10-CM

## 2021-12-04 DIAGNOSIS — I5032 Chronic diastolic (congestive) heart failure: Secondary | ICD-10-CM

## 2021-12-04 DIAGNOSIS — I10 Essential (primary) hypertension: Secondary | ICD-10-CM

## 2021-12-04 DIAGNOSIS — Z9181 History of falling: Secondary | ICD-10-CM

## 2021-12-04 DIAGNOSIS — E1169 Type 2 diabetes mellitus with other specified complication: Secondary | ICD-10-CM

## 2021-12-04 DIAGNOSIS — F32A Depression, unspecified: Secondary | ICD-10-CM

## 2021-12-04 DIAGNOSIS — S81002A Unspecified open wound, left knee, initial encounter: Secondary | ICD-10-CM

## 2021-12-04 DIAGNOSIS — M159 Polyosteoarthritis, unspecified: Secondary | ICD-10-CM

## 2021-12-04 DIAGNOSIS — J449 Chronic obstructive pulmonary disease, unspecified: Secondary | ICD-10-CM

## 2021-12-04 DIAGNOSIS — E782 Mixed hyperlipidemia: Secondary | ICD-10-CM

## 2021-12-04 DIAGNOSIS — N1831 Chronic kidney disease, stage 3a: Secondary | ICD-10-CM

## 2021-12-04 DIAGNOSIS — E785 Hyperlipidemia, unspecified: Secondary | ICD-10-CM

## 2021-12-04 NOTE — Chronic Care Management (AMB) (Signed)
Chronic Care Management   CCM RN Visit Note  12/04/2021 Name: Jill Hudson MRN: [id number] DOB: 1935-07-26  Subjective: Jill Hudson is a 87 y.o. year old female who is a primary care patient of [name], MD. The care management team was consulted for assistance with disease management and care coordination needs.    Engaged with patient by telephone for follow up visit in response to provider referral for case management and/or care coordination services.   Consent to Services:  The patient was given information about Chronic Care Management services, agreed to services, and gave verbal consent prior to initiation of services.  Please see initial visit note for detailed documentation.   Patient agreed to services and verbal consent obtained.   Assessment: Review of patient past medical history, allergies, medications, health status, including review of consultants reports, laboratory and other test data, was performed as part of comprehensive evaluation and provision of chronic care management services.   SDOH (Social Determinants of Health) assessments and interventions performed:    CCM Care Plan  Allergies  Allergen Reactions   Saxagliptin Diarrhea   Epinephrine Other (See Comments)    Patient does not remember what happens when she uses this   Atorvastatin Other (See Comments)    Muscle aches   Codeine Other (See Comments)    Upset stomach   Ezetimibe Other (See Comments)    Myalgias(ZETIA)   Limonene Rash    Patient does not recall this reaction   Nitrofurantoin Rash and Other (See Comments)    Pruitus   Sulfa Antibiotics Rash and Other (See Comments)    Sore mouth     Outpatient Encounter Medications as of 12/04/2021  Medication Sig Note   acetaminophen (TYLENOL) 650 MG CR tablet Take 1,300 mg by mouth every 8 (eight) hours as needed for pain.    albuterol (VENTOLIN HFA) 108 (90 Base) MCG/ACT inhaler Inhale 2 puffs into the lungs every 6 (six) hours as needed  for wheezing or shortness of breath.    amLODipine (NORVASC) 5 MG tablet Take 5 mg by mouth in the morning and at bedtime.    B-D ULTRAFINE III SHORT PEN 31G X 8 MM MISC USE AS DIRECTED    cloNIDine (CATAPRES) 0.1 MG tablet Take 0.1 mg by mouth 2 (two) times daily as needed.    Cyanocobalamin 1000 MCG TBCR Take 1,000 mcg by mouth daily.    ELIQUIS 2.5 MG TABS tablet TAKE 1 TABLET(2.5 MG) BY MOUTH TWICE DAILY    feeding supplement, GLUCERNA SHAKE, (GLUCERNA SHAKE) LIQD Take 237 mLs by mouth 3 (three) times daily between meals.    ferrous sulfate 325 (65 FE) MG tablet Take 325 mg by mouth daily with breakfast.    gabapentin (NEURONTIN) 100 MG capsule Take 1 capsule (100 mg total) by mouth at bedtime.    HUMALOG KWIKPEN 100 UNIT/ML KwikPen Inject 3 Units into the skin 3 (three) times daily.    HYDROcodone-acetaminophen (NORCO/VICODIN) 5-325 MG tablet Take 1 tablet by mouth every 6 (six) hours as needed.    Insulin Glargine (BASAGLAR KWIKPEN) 100 UNIT/ML INJECT 12 UNITS UNDER THE SKIN DAILY 02/25/2021: Taking 5 units q am    JARDIANCE 10 MG TABS tablet Take 10 mg by mouth daily.    losartan (COZAAR) 25 MG tablet Take 25 mg by mouth 2 (two) times daily. Unsure of dosage    melatonin 5 MG TABS Take 0.5 tablets (2.5 mg total) by mouth at bedtime as needed (sleep).  Multiple Vitamins-Minerals (PRESERVISION AREDS PO) Take 1 capsule by mouth 2 (two) times daily.     ondansetron (ZOFRAN ODT) 4 MG disintegrating tablet Take 1 tablet (4 mg total) by mouth every 6 (six) hours as needed for nausea or vomiting.    REPATHA SURECLICK 622 MG/ML SOAJ Inject into the skin.    sertraline (ZOLOFT) 50 MG tablet Take 1 tablet (50 mg total) by mouth daily.    silver sulfADIAZINE (SILVADENE) 1 % cream Apply 1 application topically daily. To wound on leg    torsemide (DEMADEX) 20 MG tablet Take 2 tablets (40 mg total) by mouth daily. (Patient taking differently: Take 40 mg by mouth 2 (two) times daily. 1/2 tablet morning  and 1/2 tablet at night.)    No facility-administered encounter medications on file as of 12/04/2021.    Patient Active Problem List   Diagnosis Date Noted   Acquired thrombophilia ([place]) 03/20/2021   Mood disorder ([place]) 05/02/2020   Chronic heart failure with preserved ejection fraction ([place]) 04/12/2020   Diverticulosis of large intestine without perforation or abscess with bleeding 03/27/2020   AF (paroxysmal atrial fibrillation) ([place])    Thrush    COPD (chronic obstructive pulmonary disease) with chronic bronchitis ([place])    CKD (chronic kidney disease), stage IIIa 02/18/2020   Malnutrition of mild degree ([place]) 01/08/2020   Age-related macular degeneration, dry, left eye 06/21/2019   Age-related macular degeneration, wet, right eye ([place]) 06/21/2019   Moderate nonproliferative diabetic retinopathy associated with type 2 diabetes mellitus ([place]) 06/21/2019   Lumbosacral radiculopathy at L4 05/01/2019   Underweight 05/01/2019   Encounter for long-term (current) use of aspirin 10/31/2018   Encounter for long-term (current) use of antiplatelets/antithrombotics 10/31/2018   Long term current use of oral hypoglycemic drug 10/31/2018   Encounter for long-term (current) use of insulin ([place]) 10/31/2018   GIB (gastrointestinal bleeding) 08/11/2018   Leg pain 07/03/2017   Carpal tunnel syndrome on both sides 05/05/2017   Myalgia due to HMG CoA reductase inhibitor 05/05/2017   History of CVA (cerebrovascular accident) 02/15/2017   Degenerative disc disease, lumbar 12/21/2016   Atherosclerosis of native arteries of extremity with intermittent claudication ([place]) 12/20/2016   Bilateral carotid artery stenosis 12/20/2016   Occlusion and stenosis of bilateral carotid arteries 12/20/2016   Hip bursitis 05/18/2016   Elevated TSH 01/18/2016   CKD stage 3 due to type 2 diabetes mellitus ([place]) 01/16/2016   Senile ecchymosis 01/16/2016   Type II diabetes mellitus with renal manifestations ([place])  09/16/2015   GERD (gastroesophageal reflux disease) 07/24/2015   PVD (peripheral vascular disease) ([place]) 05/17/2015   Neoplasm of uncertain behavior of skin 05/17/2015   Retinopathy, diabetic, proliferative ([place]) 04/11/2015   Hyperlipidemia associated with type 2 diabetes mellitus ([place]) 04/11/2015   Essential hypertension 04/11/2015   Generalized OA 04/11/2015   Proliferative diabetic retinopathy(362.02) 04/11/2015   Arteriosclerosis of coronary artery 05/29/2013   Hypertensive heart disease without CHF 05/29/2013    Conditions to be addressed/monitored:CHF, HTN, HLD, COPD, DMII, Depression, and Falls, chronic pain   Care Plan : RNCM: General Plan of Care (Adult) for Chronic Disease Management and Care Coordination Needs  Updates made by [name], RN since 12/04/2021 12:00 AM     Problem: RNCM: Development of Plan of Care for Chronic Disease Management (CHF, DM, HTN, HLD, COPD, falls, depression, Chronic Pain)   Priority: High     Long-Range Goal: RNCM: Effective Management of Plan of Care for Chronic Disease Management (CHF, DM, HTN, HLD, COPD,  falls, depression, Chronic pain)   Start Date: 10/15/2021  Expected End Date: 10/15/2022  Priority: High  Note:   Current Barriers:  Knowledge Deficits related to plan of care for management of CHF, HTN, HLD, COPD, Chronic Pain, Depression: depressed mood anxiety, and falls prevention and safety  Chronic Disease Management support and education needs related to CHF, HTN, HLD, COPD, Chronic pain, DMII, Depression: depressed mood anxiety, and falls prevention and safety  RNCM Clinical Goal(s):  Patient will verbalize basic understanding of CHF, HTN, HLD, COPD, Chronic pain, DMII, Depression, and Falls prevention and safety  disease process and self health management plan as evidenced by following the plan of care, taking medications as directed, following dietary restrictions, and working with the pcp and CCM team to optimize health and  well being take all medications exactly as prescribed and will call provider for medication related questions as evidenced by compliance with medications and calling for refills before running out of medications    demonstrate understanding of rationale for each prescribed medication as evidenced by compliance with medications regimen    attend all scheduled medical appointments: 03-24-2022 with pcp, specialist appointments coming up, as evidenced by keeping appointments and calling for schedule change needs         demonstrate improved and ongoing adherence to prescribed treatment plan for CHF, HTN, HLD, COPD, DMII, Chronic pain, Depression, and falls prevention and safety  as evidenced by stable conditions and calling the office for questions or changes  demonstrate a decrease in CHF, HTN, HLD, COPD, DMII, Depression, Chronic pain, and falls  exacerbations  as evidenced by stable conditions, normal VS, normal A1C, and regular outreaches to help with effective management of health and well being  demonstrate ongoing self health care management ability for effective management of chronic conditions  as evidenced by  working with the CCM team  through collaboration with Consulting civil engineer, provider, and care team.   Interventions: 1:1 collaboration with primary care provider regarding development and update of comprehensive plan of care as evidenced by provider attestation and co-signature Inter-disciplinary care team collaboration (see longitudinal plan of care) Evaluation of current treatment plan related to  self management and patient's adherence to plan as established by provider   Heart Failure Interventions:  (Status: Goal on Track (progressing): YES.)  Long Term Goal  Basic overview and discussion of pathophysiology of Heart Failure reviewed Provided education on low sodium diet. 12-04-2021: The patient is compliant with a heart healthy/ADA diet Reviewed Heart Failure Action Plan in depth and  provided written copy Assessed need for readable accurate scales in home Provided education about placing scale on hard, flat surface Advised patient to weigh each morning after emptying bladder. 12-04-2021: The patient has a stable weight of 117. Review of safety when weighing due to high risk for falls and injury Discussed importance of daily weight and advised patient to weigh and record daily Reviewed role of diuretics in prevention of fluid overload and management of heart failure Discussed the importance of keeping all appointments with provider. 12-04-2021: Saw the vascular surgeon on 12-01-2021 for follow up. The patient denies any new concerns with CHF or heart health Provided patient with education about the role of exercise in the management of heart failure Screening for signs and symptoms of depression related to chronic disease state  Assessed social determinant of health barriers  COPD: (Status: Goal on Track (progressing): YES.) Long Term Goal  Reviewed medications with patient, including use of prescribed maintenance and rescue  inhalers, and provided instruction on medication management and the importance of adherence. 12-04-2021: The patient is compliant with medications. Denies any new concerns with COPD medication management  Provided patient with basic written and verbal COPD education on self care/management/and exacerbation prevention Advised patient to track and manage COPD triggers. 12-04-2021: The patient knows factors that trigger her COPD. She is stable with her COPD at this time. Will continue to monitor.  Provided written and verbal instructions on pursed lip breathing and utilized returned demonstration as teach back Provided instruction about proper use of medications used for management of COPD including inhalers Advised patient to self assesses COPD action plan zone and make appointment with provider if in the yellow zone for 48 hours without improvement Advised patient to  engage in light exercise as tolerated 3-5 days a week to aid in the the management of COPD Provided education about and advised patient to utilize infection prevention strategies to reduce risk of respiratory infection. 12-04-2021: The patient denies any issues with increase risk of infection at this time. She is having issues with hip and back pain and dealing with this.  Discussed the importance of adequate rest and management of fatigue with COPD  Diabetes:  (Status: Goal on Track (progressing): YES.) Long Term Goal   Lab Results  Component Value Date   HGBA1C 7.5 06/16/2021  Recent A1C at endocrinology office was 7.4 on 10-31-2021 Assessed patient's understanding of A1c goal: <7% Provided education to patient about basic DM disease process; Reviewed medications with patient and discussed importance of medication adherence. 12-04-2021: The patient is compliant with medications;        Reviewed prescribed diet with patient heart healthy/ADA. 12-04-2021: The patient is compliant with heart healthy/ADA diet; Counseled on importance of regular laboratory monitoring as prescribed. 12-04-2021: The patient has regular lab work. Last at endocrinology with A1C of 7.4        Discussed plans with patient for ongoing care management follow up and provided patient with direct contact information for care management team;      Provided patient with written educational materials related to hypo and hyperglycemia and importance of correct treatment. 12-04-2021: The patient denies any lows or highs at this time. Is checking her blood sugars 4 times a day.        Reviewed scheduled/upcoming provider appointments including: 03-24-2022 with pcp, saw endocrinologist on 10-31-2021;         Advised patient, providing education and rationale, to check cbg before meals and at bedtime, when you have symptoms of low or high blood sugar, before and after exercise, and the patient has a continuous glucose reader   and record. 10-15-2021:  The patient states that she has been having to keep it on her right arm and it is a little sore because she had a fall and had a scabbed area on her left arm. Education on rotating sites and that likely it is sore due to keeping it on the same arm. Education and support given. States her readings have been 108 to 160. Denies any real lows or real highs. Knows how to effectively manage hypoglycemic or hyperglycemic events. 12-04-2021: The patient is waiting on her [place] supplies. She states it has been 2 weeks since she ordered them . She has been manually checking her blood sugars. States this am was 199. She denies any acute findings related to her DM and blood sugars. Knows how to effectively manage. States her average is about 199.  call provider for findings outside established parameters;       Review of patient status, including review of consultants reports, relevant laboratory and other test results, and medications completed;     Last eye exam in December on 10-10-2021. The patient was to have shots in her eyes on 11-11-2021 for macular degeneration but this had to be changed due to other things going on with the patient. The patient will go next week for injections in her eye (12-04-2021)    Falls:  (Status: Goal on track: NO.) Long Term Goal  Provided written and verbal education re: potential causes of falls and Fall prevention strategies Reviewed medications and discussed potential side effects of medications such as dizziness and frequent urination Advised patient of importance of notifying provider of falls. 12-04-2021: Review and education provided  Assessed for signs and symptoms of orthostatic hypotension. 12-04-2021: Denies any issues with orthostatic hypotension at this time. Review of being safe and monitoring for blood pressure changes.  Assessed for falls since last encounter. 12-21-20[address] x 2 last week in her home. One was when she tripped over a rug and the second time  was when she was stepping up in the living room area. She has a scabbed area to her left arm and states that her left leg gave out. Education on falls prevention and safety. 12-04-2021: The patient has fallen again and was in the ER for left hip pain. She does not know what caused her to fall. She had an MRI yesterday. She is taking Tylenol for pain relief.( See pain management in plan of care) She endorses being safe and monitoring her surroundings; but she is still experiencing falls.  Assessed patients knowledge of fall risk prevention secondary to previously provided education. 12-04-2021: Review and education provided. Is working with in home physical therapy. They had come to work with the patient at the end of the call. The patient has only had one session but she is hopeful it will help her with strengthening and ambulation.  Provided patient information for fall alert systems. 10-15-2021: The patient is switching to [facility] coverage in January. Education provided to have the patient or patients daughter to call and see if they have a life alert system through the insurance coverage for the patient. The patient states this would give her peace of mind and be helpful if she was alone with just her and her husband at home. She will inquire about life alert system.  Assessed working status of life alert bracelet and patient adherence Advised patient to discuss call the office for recommendations when new falls occur with provider  Depression   (Status: Goal on Track (progressing): YES.) Long Term Goal  Evaluation of current treatment plan related to Depression, Mental Health Concerns  self-management and patient's adherence to plan as established by provider. 12-04-2021: The patient states she is doing well with her depression. She has been focused on her health and well being and getting herself stable with walking and her pain under control. She denies any acute distress. The patient states that she  has a good support from her daughter Clarise Cruz and other family. She feels blessed and fortunate. Will continue to monitor for new concerns or issues related to mental health and wellbeing.  Discussed plans with patient for ongoing care management follow up and provided patient with direct contact information for care management team Advised patient to call the office for changes in mood, anxiety, or depression ; Provided education to  patient re: being around positive people, doing things she enjoys, talking to friends and family on the phone and utilization of resources when feeling down and depressed; Reviewed scheduled/upcoming provider appointments including 03-24-2022 Discussed plans with patient for ongoing care management follow up and provided patient with direct contact information for care management team; Advised patient to discuss new concerns about depression or questions related to mental health with provider;  Hyperlipidemia:  (Status: Goal on Track (progressing): YES.) Long Term Goal  Lab Results  Component Value Date   CHOL 80 08/19/2021   HDL 56 08/19/2021   [place] I 1 08/19/2021   TRIG 115 08/19/2021   CHOLHDL 2.1 02/19/2020     Medication review performed; medication list updated in electronic medical record. 12-04-2021: The patient takes Repatha but has been discontinued off of Crestor. Denies any acute issues at this time with her medications.  Provider established cholesterol goals reviewed. 12-04-2021: The patient is at goal. ; Counseled on importance of regular laboratory monitoring as prescribed. 12-04-2021: Has regular lab work. Last in October of 2022; Provided HLD educational materials; Reviewed role and benefits of statin for ASCVD risk reduction; Discussed strategies to manage statin-induced myalgias; Reviewed importance of limiting foods high in cholesterol. 12-04-2021: Compliance noted with heart  healthy/ADA diet; Reviewed exercise goals and target of 150 minutes per  week;  Hypertension: (Status: Goal on Track (progressing): YES.) Last practice recorded BP readings:  BP Readings from Last 3 Encounters:  12/01/21 125/63  11/06/21 132/78  10/31/21 (!) 158/103  Most recent eGFR/CrCl:  Lab Results  Component Value Date   EGFR 30 (L) 12/25/2020    No components found for: CRCL  Evaluation of current treatment plan related to hypertension self management and patient's adherence to plan as established by provider. 12-04-2021: The patient did have some elevations in blood pressure in January but that was due to a fall and visit to the ER. The patient has more stable blood pressures at this time. The patient denies any new concerns with HTN or heart health. Saw the vascular specialist 12-01-2021;   Provided education to patient re: stroke prevention, s/s of heart attack and stroke; Reviewed prescribed diet heart healthy/ADA. 12-04-2021: Is compliant with heart healthy diet  Reviewed medications with patient and discussed importance of compliance. 12-04-2021: Is compliant with medications;  Discussed plans with patient for ongoing care management follow up and provided patient with direct contact information for care management team; Advised patient, providing education and rationale, to monitor blood pressure daily and record, calling PCP for findings outside established parameters;  Advised patient to discuss changes in blood pressure trends  with provider; Provided education on prescribed diet heart healthy/ADA diet ;  Discussed complications of poorly controlled blood pressure such as heart disease, stroke, circulatory complications, vision complications, kidney impairment, sexual dysfunction;    Pain:  (Status: Goal on Track (progressing): YES.) Long Term Goal  Pain assessment performed. 12-04-2021: The patient is rating her pain level at a 5 to 6 today on a scale of 0-10. The patient had a fall and is experiencing hip and back pain (chronic in nature).  The patient  states it is not too bad today Medications reviewed. 12-04-2021: The patient is taking Tylenol for pain relief.  Reviewed provider established plan for pain management. 12-04-2021: The patient had an MRI yesterday and is awaiting results. The patient is working with in home physical therapy. She has an appointment today and the therapist was there at the end of the call. She  has had one previous treatment. She is hopeful this will help increase her strength and mobility. She has been using heat to the left hip and acquired a wound due to the heating pad sticking to her upper thigh and it formed a blister. The area is covered and her daughter is changing the dressing every night. She states it is looking better and "healing". Review of sx and sx of infection and to call the office for changes; Discussed importance of adherence to all scheduled medical appointments. 12-04-2021: Has seen several providers recently and has follow up accordingly with providers. Will continue to monitor.  Counseled on the importance of reporting any/all new or changed pain symptoms or management strategies to pain management provider; Advised patient to report to care team affect of pain on daily activities; Discussed use of relaxation techniques and/or diversional activities to assist with pain reduction (distraction, imagery, relaxation, massage, acupressure, TENS, heat, and cold application; Reviewed with patient prescribed pharmacological and nonpharmacological pain relief strategies; Advised patient to discuss unresolved pain, changes in level or intensity of pain with provider;   Patient Goals/Self-Care Activities: Take medications as prescribed   Attend all scheduled provider appointments Call pharmacy for medication refills 3-7 days in advance of running out of medications Attend church or other social activities Perform all self care activities independently  Perform IADL's (shopping, preparing meals, housekeeping,  managing finances) independently Call provider office for new concerns or questions  Work with the social worker to address care coordination needs and will continue to work with the clinical team to address health care and disease management related needs call the Suicide and Crisis Lifeline: 988 call the [place] National Suicide Prevention Lifeline: (325) 070-8214 or TTY: (212) 179-2744 TTY (860)237-1670) to talk to a trained counselor call 1-800-273-TALK (toll free, 24 hour hotline) if experiencing a Mental Health or [place]  call office if I gain more than 2 pounds in one day or 5 pounds in one week keep legs up while sitting meet with dietitian track weight in diary use salt in moderation watch for swelling in feet, ankles and legs every day weigh myself daily begin a heart failure diary bring diary to all appointments develop a rescue plan follow rescue plan if symptoms flare-up eat more whole grains, fruits and vegetables, lean meats and healthy fats schedule appointment with eye doctor check blood sugar at prescribed times: before meals and at bedtime, when you have symptoms of low or high blood sugar, and before and after exercise check feet daily for cuts, sores or redness enter blood sugar readings and medication or insulin into daily log take the blood sugar log to all doctor visits take the blood sugar meter to all doctor visits set goal weight trim toenails straight across drink 6 to 8 glasses of water each day fill half of plate with vegetables limit fast food meals to no more than 1 per week manage portion size prepare main meal at home 3 to 5 days each week read food labels for fat, fiber, carbohydrates and portion size reduce red meat to 2 to 3 times a week keep feet up while sitting wash and dry feet carefully every day wear comfortable, cotton socks wear comfortable, well-fitting shoes - avoid second hand smoke - eliminate smoking in my home -  identify and avoid work-related triggers - identify and remove indoor air pollutants - limit outdoor activity during cold weather - listen for public air quality announcements every day - do breathing exercises every day -  develop a rescue plan - eliminate symptom triggers at home - follow rescue plan if symptoms flare-up - keep follow-up appointments: 11-07-2021 at 240 pm  - use an extra pillow to sleep - develop a new routine to improve sleep - don't eat or exercise right before bedtime - get at least 7 to 8 hours of sleep at night - use devices that will help like a cane, sock-puller or reacher - practice relaxation or meditation daily - do exercises in a comfortable position that makes breathing as easy as possible check blood pressure 3 times per week choose a place to take my blood pressure (home, clinic or office, retail store) write blood pressure results in a log or diary learn about high blood pressure keep a blood pressure log take blood pressure log to all doctor appointments call doctor for signs and symptoms of high blood pressure develop an action plan for high blood pressure keep all doctor appointments take medications for blood pressure exactly as prescribed report new symptoms to your doctor eat more whole grains, fruits and vegetables, lean meats and healthy fats - call for medicine refill 2 or 3 days before it runs out - take all medications exactly as prescribed - call doctor with any symptoms you believe are related to your medicine - call doctor when you experience any new symptoms - go to all doctor appointments as scheduled - adhere to prescribed diet: heart healthy/ADA       Plan:Telephone follow up appointment with care management team member scheduled for:  30 to 67 days  Noreene Larsson RN, MSN, [facility] Mobile: [phone]

## 2021-12-04 NOTE — Patient Instructions (Signed)
Visit Information  Thank you for taking time to visit with me today. Please don't hesitate to contact me if I can be of assistance to you before our next scheduled telephone appointment.  Following are the goals we discussed today:  RNCM Clinical Goal(s):  Patient will verbalize basic understanding of CHF, HTN, HLD, COPD, Chronic pain, DMII, Depression, and Falls prevention and safety  disease process and self health management plan as evidenced by following the plan of care, taking medications as directed, following dietary restrictions, and working with the pcp and CCM team to optimize health and well being take all medications exactly as prescribed and will call provider for medication related questions as evidenced by compliance with medications and calling for refills before running out of medications    demonstrate understanding of rationale for each prescribed medication as evidenced by compliance with medications regimen    attend all scheduled medical appointments: 03-24-2022 with pcp, specialist appointments coming up, as evidenced by keeping appointments and calling for schedule change needs         demonstrate improved and ongoing adherence to prescribed treatment plan for CHF, HTN, HLD, COPD, DMII, Chronic pain, Depression, and falls prevention and safety  as evidenced by stable conditions and calling the office for questions or changes  demonstrate a decrease in CHF, HTN, HLD, COPD, DMII, Depression, Chronic pain, and falls  exacerbations  as evidenced by stable conditions, normal VS, normal A1C, and regular outreaches to help with effective management of health and well being  demonstrate ongoing self health care management ability for effective management of chronic conditions  as evidenced by  working with the CCM team  through collaboration with Consulting civil engineer, provider, and care team.    Interventions: 1:1 collaboration with primary care provider regarding development and update of  comprehensive plan of care as evidenced by provider attestation and co-signature Inter-disciplinary care team collaboration (see longitudinal plan of care) Evaluation of current treatment plan related to  self management and patient's adherence to plan as established by provider     Heart Failure Interventions:  (Status: Goal on Track (progressing): YES.)  Long Term Goal  Basic overview and discussion of pathophysiology of Heart Failure reviewed Provided education on low sodium diet. 12-04-2021: The patient is compliant with a heart healthy/ADA diet Reviewed Heart Failure Action Plan in depth and provided written copy Assessed need for readable accurate scales in home Provided education about placing scale on hard, flat surface Advised patient to weigh each morning after emptying bladder. 12-04-2021: The patient has a stable weight of 117. Review of safety when weighing due to high risk for falls and injury Discussed importance of daily weight and advised patient to weigh and record daily Reviewed role of diuretics in prevention of fluid overload and management of heart failure Discussed the importance of keeping all appointments with provider. 12-04-2021: Saw the vascular surgeon on 12-01-2021 for follow up. The patient denies any new concerns with CHF or heart health Provided patient with education about the role of exercise in the management of heart failure Screening for signs and symptoms of depression related to chronic disease state  Assessed social determinant of health barriers   COPD: (Status: Goal on Track (progressing): YES.) Long Term Goal  Reviewed medications with patient, including use of prescribed maintenance and rescue inhalers, and provided instruction on medication management and the importance of adherence. 12-04-2021: The patient is compliant with medications. Denies any new concerns with COPD medication management  Provided patient with  basic written and verbal COPD education on  self care/management/and exacerbation prevention Advised patient to track and manage COPD triggers. 12-04-2021: The patient knows factors that trigger her COPD. She is stable with her COPD at this time. Will continue to monitor.  Provided written and verbal instructions on pursed lip breathing and utilized returned demonstration as teach back Provided instruction about proper use of medications used for management of COPD including inhalers Advised patient to self assesses COPD action plan zone and make appointment with provider if in the yellow zone for 48 hours without improvement Advised patient to engage in light exercise as tolerated 3-5 days a week to aid in the the management of COPD Provided education about and advised patient to utilize infection prevention strategies to reduce risk of respiratory infection. 12-04-2021: The patient denies any issues with increase risk of infection at this time. She is having issues with hip and back pain and dealing with this.  Discussed the importance of adequate rest and management of fatigue with COPD   Diabetes:  (Status: Goal on Track (progressing): YES.) Long Term Goal         Lab Results  Component Value Date    HGBA1C 7.5 06/16/2021  Recent A1C at endocrinology office was 7.4 on 10-31-2021 Assessed patient's understanding of A1c goal: <7% Provided education to patient about basic DM disease process; Reviewed medications with patient and discussed importance of medication adherence. 12-04-2021: The patient is compliant with medications;        Reviewed prescribed diet with patient heart healthy/ADA. 12-04-2021: The patient is compliant with heart healthy/ADA diet; Counseled on importance of regular laboratory monitoring as prescribed. 12-04-2021: The patient has regular lab work. Last at endocrinology with A1C of 7.4        Discussed plans with patient for ongoing care management follow up and provided patient with direct contact information for care  management team;      Provided patient with written educational materials related to hypo and hyperglycemia and importance of correct treatment. 12-04-2021: The patient denies any lows or highs at this time. Is checking her blood sugars 4 times a day.        Reviewed scheduled/upcoming provider appointments including: 03-24-2022 with pcp, saw endocrinologist on 10-31-2021;         Advised patient, providing education and rationale, to check cbg before meals and at bedtime, when you have symptoms of low or high blood sugar, before and after exercise, and the patient has a continuous glucose reader   and record. 10-15-2021: The patient states that she has been having to keep it on her right arm and it is a little sore because she had a fall and had a scabbed area on her left arm. Education on rotating sites and that likely it is sore due to keeping it on the same arm. Education and support given. States her readings have been 108 to 160. Denies any real lows or real highs. Knows how to effectively manage hypoglycemic or hyperglycemic events. 12-04-2021: The patient is waiting on her [place] supplies. She states it has been 2 weeks since she ordered them . She has been manually checking her blood sugars. States this am was 199. She denies any acute findings related to her DM and blood sugars. Knows how to effectively manage. States her average is about 199.       call provider for findings outside established parameters;       Review of patient status, including review of  consultants reports, relevant laboratory and other test results, and medications completed;     Last eye exam in December on 10-10-2021. The patient was to have shots in her eyes on 11-11-2021 for macular degeneration but this had to be changed due to other things going on with the patient. The patient will go next week for injections in her eye (12-04-2021)     Falls:  (Status: Goal on track: NO.) Long Term Goal  Provided written and verbal  education re: potential causes of falls and Fall prevention strategies Reviewed medications and discussed potential side effects of medications such as dizziness and frequent urination Advised patient of importance of notifying provider of falls. 12-04-2021: Review and education provided  Assessed for signs and symptoms of orthostatic hypotension. 12-04-2021: Denies any issues with orthostatic hypotension at this time. Review of being safe and monitoring for blood pressure changes.  Assessed for falls since last encounter. 12-21-20[address] [address] x 2 last week in her home. One was when she tripped over a rug and the second time was when she was stepping up in the living room area. She has a scabbed area to her left arm and states that her left leg gave out. Education on falls prevention and safety. 12-04-2021: The patient has fallen again and was in the ER for left hip pain. She does not know what caused her to fall. She had an MRI yesterday. She is taking Tylenol for pain relief.( See pain management in plan of care) She endorses being safe and monitoring her surroundings; but she is still experiencing falls.  Assessed patients knowledge of fall risk prevention secondary to previously provided education. 12-04-2021: Review and education provided. Is working with in home physical therapy. They had come to work with the patient at the end of the call. The patient has only had one session but she is hopeful it will help her with strengthening and ambulation.  Provided patient information for fall alert systems. 10-15-2021: The patient is switching to [facility] coverage in January. Education provided to have the patient or patients daughter to call and see if they have a life alert system through the insurance coverage for the patient. The patient states this would give her peace of mind and be helpful if she was alone with just her and her husband at home. She will inquire about life alert system.  Assessed working  status of life alert bracelet and patient adherence Advised patient to discuss call the office for recommendations when new falls occur with provider   Depression   (Status: Goal on Track (progressing): YES.) Long Term Goal  Evaluation of current treatment plan related to Depression, Mental Health Concerns  self-management and patient's adherence to plan as established by provider. 12-04-2021: The patient states she is doing well with her depression. She has been focused on her health and well being and getting herself stable with walking and her pain under control. She denies any acute distress. The patient states that she has a good support from her daughter Clarise Cruz and other family. She feels blessed and fortunate. Will continue to monitor for new concerns or issues related to mental health and wellbeing.  Discussed plans with patient for ongoing care management follow up and provided patient with direct contact information for care management team Advised patient to call the office for changes in mood, anxiety, or depression ; Provided education to patient re: being around positive people, doing things she enjoys, talking to friends and family on the phone  and utilization of resources when feeling down and depressed; Reviewed scheduled/upcoming provider appointments including 03-24-2022 Discussed plans with patient for ongoing care management follow up and provided patient with direct contact information for care management team; Advised patient to discuss new concerns about depression or questions related to mental health with provider;   Hyperlipidemia:  (Status: Goal on Track (progressing): YES.) Long Term Goal       Lab Results  Component Value Date    CHOL 80 08/19/2021    HDL 56 08/19/2021    [place] 1 08/19/2021    TRIG 115 08/19/2021    CHOLHDL 2.1 02/19/2020      Medication review performed; medication list updated in electronic medical record. 12-04-2021: The patient takes Repatha but  has been discontinued off of Crestor. Denies any acute issues at this time with her medications.  Provider established cholesterol goals reviewed. 12-04-2021: The patient is at goal. ; Counseled on importance of regular laboratory monitoring as prescribed. 12-04-2021: Has regular lab work. Last in October of 2022; Provided HLD educational materials; Reviewed role and benefits of statin for ASCVD risk reduction; Discussed strategies to manage statin-induced myalgias; Reviewed importance of limiting foods high in cholesterol. 12-04-2021: Compliance noted with heart  healthy/ADA diet; Reviewed exercise goals and target of 150 minutes per week;   Hypertension: (Status: Goal on Track (progressing): YES.) Last practice recorded BP readings:     BP Readings from Last 3 Encounters:  12/01/21 125/63  11/06/21 132/78  10/31/21 (!) 158/103  Most recent eGFR/CrCl:       Lab Results  Component Value Date    EGFR 30 (L) 12/25/2020    No components found for: CRCL   Evaluation of current treatment plan related to hypertension self management and patient's adherence to plan as established by provider. 12-04-2021: The patient did have some elevations in blood pressure in January but that was due to a fall and visit to the ER. The patient has more stable blood pressures at this time. The patient denies any new concerns with HTN or heart health. Saw the vascular specialist 12-01-2021;   Provided education to patient re: stroke prevention, s/s of heart attack and stroke; Reviewed prescribed diet heart healthy/ADA. 12-04-2021: Is compliant with heart healthy diet  Reviewed medications with patient and discussed importance of compliance. 12-04-2021: Is compliant with medications;  Discussed plans with patient for ongoing care management follow up and provided patient with direct contact information for care management team; Advised patient, providing education and rationale, to monitor blood pressure daily and record,  calling PCP for findings outside established parameters;  Advised patient to discuss changes in blood pressure trends  with provider; Provided education on prescribed diet heart healthy/ADA diet ;  Discussed complications of poorly controlled blood pressure such as heart disease, stroke, circulatory complications, vision complications, kidney impairment, sexual dysfunction;      Pain:  (Status: Goal on Track (progressing): YES.) Long Term Goal  Pain assessment performed. 12-04-2021: The patient is rating her pain level at a 5 to 6 today on a scale of 0-10. The patient had a fall and is experiencing hip and back pain (chronic in nature).  The patient states it is not too bad today Medications reviewed. 12-04-2021: The patient is taking Tylenol for pain relief.  Reviewed provider established plan for pain management. 12-04-2021: The patient had an MRI yesterday and is awaiting results. The patient is working with in home physical therapy. She has an appointment today and the therapist was there  at the end of the call. She has had one previous treatment. She is hopeful this will help increase her strength and mobility. She has been using heat to the left hip and acquired a wound due to the heating pad sticking to her upper thigh and it formed a blister. The area is covered and her daughter is changing the dressing every night. She states it is looking better and "healing". Review of sx and sx of infection and to call the office for changes; Discussed importance of adherence to all scheduled medical appointments. 12-04-2021: Has seen several providers recently and has follow up accordingly with providers. Will continue to monitor.  Counseled on the importance of reporting any/all new or changed pain symptoms or management strategies to pain management provider; Advised patient to report to care team affect of pain on daily activities; Discussed use of relaxation techniques and/or diversional activities to assist  with pain reduction (distraction, imagery, relaxation, massage, acupressure, TENS, heat, and cold application; Reviewed with patient prescribed pharmacological and nonpharmacological pain relief strategies; Advised patient to discuss unresolved pain, changes in level or intensity of pain with provider;    Patient Goals/Self-Care Activities: Take medications as prescribed   Attend all scheduled provider appointments Call pharmacy for medication refills 3-7 days in advance of running out of medications Attend church or other social activities Perform all self care activities independently  Perform IADL's (shopping, preparing meals, housekeeping, managing finances) independently Call provider office for new concerns or questions  Work with the social worker to address care coordination needs and will continue to work with the clinical team to address health care and disease management related needs call the Suicide and Crisis Lifeline: 988 call the [place] National Suicide Prevention Lifeline: 810-536-7697 or TTY: 361 520 7689 TTY (317) 575-5248) to talk to a trained counselor call 1-800-273-TALK (toll free, 24 hour hotline) if experiencing a Mental Health or [place]  call office if I gain more than 2 pounds in one day or 5 pounds in one week keep legs up while sitting meet with dietitian track weight in diary use salt in moderation watch for swelling in feet, ankles and legs every day weigh myself daily begin a heart failure diary bring diary to all appointments develop a rescue plan follow rescue plan if symptoms flare-up eat more whole grains, fruits and vegetables, lean meats and healthy fats schedule appointment with eye doctor check blood sugar at prescribed times: before meals and at bedtime, when you have symptoms of low or high blood sugar, and before and after exercise check feet daily for cuts, sores or redness enter blood sugar readings and medication or insulin  into daily log take the blood sugar log to all doctor visits take the blood sugar meter to all doctor visits set goal weight trim toenails straight across drink 6 to 8 glasses of water each day fill half of plate with vegetables limit fast food meals to no more than 1 per week manage portion size prepare main meal at home 3 to 5 days each week read food labels for fat, fiber, carbohydrates and portion size reduce red meat to 2 to 3 times a week keep feet up while sitting wash and dry feet carefully every day wear comfortable, cotton socks wear comfortable, well-fitting shoes - avoid second hand smoke - eliminate smoking in my home - identify and avoid work-related triggers - identify and remove indoor air pollutants - limit outdoor activity during cold weather - listen for public air quality announcements  every day - do breathing exercises every day - develop a rescue plan - eliminate symptom triggers at home - follow rescue plan if symptoms flare-up - keep follow-up appointments: 11-07-2021 at 240 pm  - use an extra pillow to sleep - develop a new routine to improve sleep - don't eat or exercise right before bedtime - get at least 7 to 8 hours of sleep at night - use devices that will help like a cane, sock-puller or reacher - practice relaxation or meditation daily - do exercises in a comfortable position that makes breathing as easy as possible check blood pressure 3 times per week choose a place to take my blood pressure (home, clinic or office, retail store) write blood pressure results in a log or diary learn about high blood pressure keep a blood pressure log take blood pressure log to all doctor appointments call doctor for signs and symptoms of high blood pressure develop an action plan for high blood pressure keep all doctor appointments take medications for blood pressure exactly as prescribed report new symptoms to your doctor eat more whole grains, fruits and  vegetables, lean meats and healthy fats - call for medicine refill 2 or 3 days before it runs out - take all medications exactly as prescribed - call doctor with any symptoms you believe are related to your medicine - call doctor when you experience any new symptoms - go to all doctor appointments as scheduled - adhere to prescribed diet: heart healthy/ADA      Our next appointment is by telephone in 30 to 60 days  Please call the care guide team at [phone] if you need to cancel or reschedule your appointment.   If you are experiencing a Mental Health or [place] or need someone to talk to, please call the Suicide and Crisis Lifeline: 988 call the [place] National Suicide Prevention Lifeline: 4048809195 or TTY: 416-321-9514 TTY 6052380500) to talk to a trained counselor call 1-800-273-TALK (toll free, 24 hour hotline)   Patient verbalizes understanding of instructions and care plan provided today and agrees to view in [place]. Active MyChart status confirmed with patient.    Noreene Larsson RN, MSN, [facility] Mobile: [phone]

## 2021-12-09 DIAGNOSIS — Z794 Long term (current) use of insulin: Secondary | ICD-10-CM | POA: Diagnosis not present

## 2021-12-09 DIAGNOSIS — E119 Type 2 diabetes mellitus without complications: Secondary | ICD-10-CM | POA: Diagnosis not present

## 2021-12-12 DIAGNOSIS — M5416 Radiculopathy, lumbar region: Secondary | ICD-10-CM | POA: Diagnosis not present

## 2021-12-12 DIAGNOSIS — M48062 Spinal stenosis, lumbar region with neurogenic claudication: Secondary | ICD-10-CM | POA: Diagnosis not present

## 2021-12-14 ENCOUNTER — Other Ambulatory Visit: Payer: Self-pay | Admitting: Internal Medicine

## 2021-12-14 DIAGNOSIS — J449 Chronic obstructive pulmonary disease, unspecified: Secondary | ICD-10-CM

## 2021-12-15 NOTE — Telephone Encounter (Signed)
Completed course,not on current med profile.  Requested Prescriptions  Pending Prescriptions Disp Refills   promethazine-dextromethorphan (PROMETHAZINE-DM) 6.25-15 MG/5ML syrup [Pharmacy Med Name: PROMETHAZINE DM SYRUP] 180 mL 0    Sig: TAKE 5 ML BY MOUTH FOUR TIMES DAILY AS NEEDED FOR COUGH     Ear, Nose, and Throat:  Antitussives/Expectorants Passed - 12/14/2021 12:58 PM      Passed - Valid encounter within last 12 months    Recent Outpatient Visits          1 month ago Cellulitis of left lower extremity   [facility] [name], MD   3 months ago Essential hypertension   [facility] [name], MD   7 months ago Cellulitis of left lower extremity   [facility] [name], MD   7 months ago Localized edema   [facility] [name], MD   9 months ago Essential hypertension   [facility] [name], MD      Future Appointments            In 3 months Army Melia Jesse Sans, MD [facility], [facility]

## 2021-12-23 DIAGNOSIS — E1169 Type 2 diabetes mellitus with other specified complication: Secondary | ICD-10-CM

## 2021-12-23 DIAGNOSIS — J449 Chronic obstructive pulmonary disease, unspecified: Secondary | ICD-10-CM

## 2021-12-23 DIAGNOSIS — E785 Hyperlipidemia, unspecified: Secondary | ICD-10-CM | POA: Diagnosis not present

## 2021-12-23 DIAGNOSIS — M159 Polyosteoarthritis, unspecified: Secondary | ICD-10-CM

## 2021-12-23 DIAGNOSIS — I10 Essential (primary) hypertension: Secondary | ICD-10-CM | POA: Diagnosis not present

## 2021-12-23 DIAGNOSIS — E782 Mixed hyperlipidemia: Secondary | ICD-10-CM | POA: Diagnosis not present

## 2021-12-23 DIAGNOSIS — N1831 Chronic kidney disease, stage 3a: Secondary | ICD-10-CM | POA: Diagnosis not present

## 2021-12-23 DIAGNOSIS — E1122 Type 2 diabetes mellitus with diabetic chronic kidney disease: Secondary | ICD-10-CM

## 2021-12-23 DIAGNOSIS — F32A Depression, unspecified: Secondary | ICD-10-CM

## 2021-12-23 DIAGNOSIS — I5032 Chronic diastolic (congestive) heart failure: Secondary | ICD-10-CM

## 2021-12-25 ENCOUNTER — Other Ambulatory Visit: Payer: Self-pay | Admitting: Internal Medicine

## 2021-12-25 ENCOUNTER — Ambulatory Visit: Payer: Self-pay

## 2021-12-25 ENCOUNTER — Other Ambulatory Visit: Payer: Self-pay

## 2021-12-25 ENCOUNTER — Telehealth: Payer: Self-pay | Admitting: Internal Medicine

## 2021-12-25 DIAGNOSIS — J449 Chronic obstructive pulmonary disease, unspecified: Secondary | ICD-10-CM

## 2021-12-25 MED ORDER — PROMETHAZINE-DM 6.25-15 MG/5ML PO SYRP: ORAL_SOLUTION | ORAL | 0 refills | Status: DC

## 2021-12-25 NOTE — Telephone Encounter (Signed)
Chief Complaint: Cough at night ?Symptoms: No other symptoms ?Frequency: Last year ?Pertinent Negatives: Patient denies SOB, CP ?Disposition: [] ED /[] Urgent Care (no appt availability in office) / [] Appointment(In office/virtual)/ []  [facility]/ [] Home Care/ [x] Refused Recommended Disposition /[] [facility]/ []  Follow-up with PCP ?Additional Notes: Patient's daughter requested Promethazine-DM cough syrup to be refilled in another encounter. Called to triage symptoms. Advised this type cough medication is not something to use all the time, but prescribed for an acute issue, she verbalized understanding. She refused appointment at this time, asked for OTC medications, care advice given, advised to call back if cough not better. ? ? ?Reason for Disposition ? Cough lasts > 3 weeks ? ?Answer Assessment - Initial Assessment Questions ?1. ONSET: "When did the cough begin?"  ?    Sometime last year ?2. SEVERITY: "How bad is the cough today?"  ?    Not bad, noticed it last night ?3. SPUTUM: "Describe the color of your sputum" (none, dry cough; clear, white, yellow, green) ?    No ?4. HEMOPTYSIS: "Are you coughing up any blood?" If so ask: "How much?" (flecks, streaks, tablespoons, etc.) ?    N/A ?5. DIFFICULTY BREATHING: "Are you having difficulty breathing?" If Yes, ask: "How bad is it?" (e.g., mild, moderate, severe)  ?  - MILD: No SOB at rest, mild SOB with walking, speaks normally in sentences, can lie down, no retractions, pulse < 100.  ?  - MODERATE: SOB at rest, SOB with minimal exertion and prefers to sit, cannot lie down flat, speaks in phrases, mild retractions, audible wheezing, pulse 100-120.  ?  - SEVERE: Very SOB at rest, speaks in single words, struggling to breathe, sitting hunched forward, retractions, pulse > 120  ?    No ?6. FEVER: "Do you have a fever?" If Yes, ask: "What is your temperature, how was it measured, and when did it start?" ?    No ?7. CARDIAC HISTORY: "Do you have  any history of heart disease?" (e.g., heart attack, congestive heart failure)  ?    Yes ?8. LUNG HISTORY: "Do you have any history of lung disease?"  (e.g., pulmonary embolus, asthma, emphysema) ?    COPD ?9. OTHER SYMPTOMS: "Do you have any other symptoms?" (e.g., runny nose, wheezing, chest pain) ?      No ? ?Protocols used: Cough - Chronic-A-AH ? ?

## 2021-12-25 NOTE — Telephone Encounter (Unsigned)
Copied from [place] [id number]. Topic: Quick Communication - Rx Refill/Question ?>> Dec 25, 2021  9:22 AM Tessa Lerner A wrote: ?Medication: promethazine-dextromethorphan (PROMETHAZINE-DM) 6.25-15 MG/5ML syrup [[id number]]  DISCONTINUED ? ?Has the patient contacted their pharmacy? Yes.   ?(Agent: If no, request that the patient contact the pharmacy for the refill. If patient does not wish to contact the pharmacy document the reason why and proceed with request.) ?(Agent: If yes, when and what did the pharmacy advise?) ? ?Preferred Pharmacy (with phone number or street name): [id number] - [place], [address] ?[place] [address] ?Phone: [phone] Fax: [phone] ?Hours: Not open 24 hours ? ?Has the patient been seen for an appointment in the last year OR does the patient have an upcoming appointment? Yes.   ? ?Agent: Please be advised that RX refills may take up to 3 business days. We ask that you follow-up with your pharmacy. ?

## 2021-12-25 NOTE — Telephone Encounter (Signed)
Please review.  KP

## 2021-12-25 NOTE — Telephone Encounter (Signed)
Spoke to daughter let her know that a refill was sent to pharmacy. She verbalized understanding. ? ?KP ?

## 2021-12-25 NOTE — Telephone Encounter (Signed)
Patient's daughter called and advised medication is not listed on current list. She says her mom takes this at night for cough. Triaged in another encounter, daughter refused appointment, asked for OTC medications. Advised per advice in triage encounter. Advised I will send this to Dr. Army Melia for recommendations. ?

## 2021-12-26 ENCOUNTER — Other Ambulatory Visit: Payer: Self-pay | Admitting: Neurosurgery

## 2021-12-26 DIAGNOSIS — I10 Essential (primary) hypertension: Secondary | ICD-10-CM | POA: Diagnosis not present

## 2021-12-26 DIAGNOSIS — I340 Nonrheumatic mitral (valve) insufficiency: Secondary | ICD-10-CM | POA: Diagnosis not present

## 2021-12-26 DIAGNOSIS — I2510 Atherosclerotic heart disease of native coronary artery without angina pectoris: Secondary | ICD-10-CM | POA: Diagnosis not present

## 2021-12-26 DIAGNOSIS — I739 Peripheral vascular disease, unspecified: Secondary | ICD-10-CM | POA: Diagnosis not present

## 2021-12-26 DIAGNOSIS — I480 Paroxysmal atrial fibrillation: Secondary | ICD-10-CM | POA: Diagnosis not present

## 2021-12-26 DIAGNOSIS — E785 Hyperlipidemia, unspecified: Secondary | ICD-10-CM | POA: Diagnosis not present

## 2021-12-26 DIAGNOSIS — Z01818 Encounter for other preprocedural examination: Secondary | ICD-10-CM

## 2021-12-26 DIAGNOSIS — I5032 Chronic diastolic (congestive) heart failure: Secondary | ICD-10-CM | POA: Diagnosis not present

## 2021-12-31 DIAGNOSIS — I358 Other nonrheumatic aortic valve disorders: Secondary | ICD-10-CM | POA: Diagnosis not present

## 2021-12-31 DIAGNOSIS — I5030 Unspecified diastolic (congestive) heart failure: Secondary | ICD-10-CM | POA: Diagnosis not present

## 2021-12-31 DIAGNOSIS — I3489 Other nonrheumatic mitral valve disorders: Secondary | ICD-10-CM | POA: Diagnosis not present

## 2022-01-05 DIAGNOSIS — H353221 Exudative age-related macular degeneration, left eye, with active choroidal neovascularization: Secondary | ICD-10-CM | POA: Diagnosis not present

## 2022-01-05 DIAGNOSIS — H353211 Exudative age-related macular degeneration, right eye, with active choroidal neovascularization: Secondary | ICD-10-CM | POA: Diagnosis not present

## 2022-01-08 DIAGNOSIS — E119 Type 2 diabetes mellitus without complications: Secondary | ICD-10-CM | POA: Diagnosis not present

## 2022-01-08 DIAGNOSIS — Z794 Long term (current) use of insulin: Secondary | ICD-10-CM | POA: Diagnosis not present

## 2022-01-12 ENCOUNTER — Other Ambulatory Visit: Admission: RE | Admit: 2022-01-12 | Payer: Medicare HMO | Source: Ambulatory Visit

## 2022-01-19 ENCOUNTER — Other Ambulatory Visit: Payer: Medicare HMO

## 2022-01-19 DIAGNOSIS — Z794 Long term (current) use of insulin: Secondary | ICD-10-CM | POA: Diagnosis not present

## 2022-01-19 DIAGNOSIS — B351 Tinea unguium: Secondary | ICD-10-CM | POA: Diagnosis not present

## 2022-01-19 DIAGNOSIS — E1142 Type 2 diabetes mellitus with diabetic polyneuropathy: Secondary | ICD-10-CM | POA: Diagnosis not present

## 2022-01-21 ENCOUNTER — Ambulatory Visit: Admit: 2022-01-21 | Payer: Medicare HMO | Admitting: Neurosurgery

## 2022-01-21 SURGERY — FAR LATERAL DECOMPRESSION 1 LEVEL
Anesthesia: General

## 2022-01-27 ENCOUNTER — Telehealth: Payer: Self-pay | Admitting: Internal Medicine

## 2022-01-27 NOTE — Telephone Encounter (Signed)
Copied from [place] [id number]. Topic: Medicare AWV ?>> Jan 27, 2022  1:28 PM Cher Nakai R wrote: ?Reason for CRM: Left message for patient to call back and schedule Medicare Annual Wellness Visit (AWV) in office.  ? ?If unable to come into the office for AWV,  please offer to do virtually or by telephone. ? ?Last AWV:  09/04/2020 ? ?Please schedule at anytime with [facility] Health Advisor.     ? ?30 minute appointment for Virtual or phone ?45 minute appointment for in office or Initial virtual/phone ? ?Any questions, please call me at [phone] ?

## 2022-01-29 DIAGNOSIS — I10 Essential (primary) hypertension: Secondary | ICD-10-CM | POA: Diagnosis not present

## 2022-01-29 DIAGNOSIS — E1122 Type 2 diabetes mellitus with diabetic chronic kidney disease: Secondary | ICD-10-CM | POA: Diagnosis not present

## 2022-01-29 DIAGNOSIS — N1832 Chronic kidney disease, stage 3b: Secondary | ICD-10-CM | POA: Diagnosis not present

## 2022-01-29 DIAGNOSIS — R600 Localized edema: Secondary | ICD-10-CM | POA: Diagnosis not present

## 2022-02-06 ENCOUNTER — Ambulatory Visit (INDEPENDENT_AMBULATORY_CARE_PROVIDER_SITE_OTHER): Payer: Medicare HMO

## 2022-02-06 DIAGNOSIS — I10 Essential (primary) hypertension: Secondary | ICD-10-CM

## 2022-02-06 DIAGNOSIS — E782 Mixed hyperlipidemia: Secondary | ICD-10-CM

## 2022-02-06 DIAGNOSIS — F32A Depression, unspecified: Secondary | ICD-10-CM

## 2022-02-06 DIAGNOSIS — M79604 Pain in right leg: Secondary | ICD-10-CM

## 2022-02-06 DIAGNOSIS — E1169 Type 2 diabetes mellitus with other specified complication: Secondary | ICD-10-CM

## 2022-02-06 DIAGNOSIS — Z9181 History of falling: Secondary | ICD-10-CM

## 2022-02-06 DIAGNOSIS — I5032 Chronic diastolic (congestive) heart failure: Secondary | ICD-10-CM

## 2022-02-06 DIAGNOSIS — N1831 Chronic kidney disease, stage 3a: Secondary | ICD-10-CM

## 2022-02-06 DIAGNOSIS — J449 Chronic obstructive pulmonary disease, unspecified: Secondary | ICD-10-CM

## 2022-02-06 DIAGNOSIS — M159 Polyosteoarthritis, unspecified: Secondary | ICD-10-CM

## 2022-02-06 NOTE — Chronic Care Management (AMB) (Signed)
?Chronic Care Management  ? ?CCM RN Visit Note ? ?02/06/2022 ?Name: Jill Hudson MRN: [id number] DOB: 12-05-1934 ? ?Subjective: ?Jill Hudson is a 87 y.o. year old female who is a primary care patient of [name], MD. The care management team was consulted for assistance with disease management and care coordination needs.   ? ?Engaged with patient by telephone for follow up visit in response to provider referral for case management and/or care coordination services.  ? ?Consent to Services:  ?The patient was given information about Chronic Care Management services, agreed to services, and gave verbal consent prior to initiation of services.  Please see initial visit note for detailed documentation.  ? ?Patient agreed to services and verbal consent obtained.  ? ?Assessment: Review of patient past medical history, allergies, medications, health status, including review of consultants reports, laboratory and other test data, was performed as part of comprehensive evaluation and provision of chronic care management services.  ? ?SDOH (Social Determinants of Health) assessments and interventions performed:   ? ?CCM Care Plan ? ?Allergies  ?Allergen Reactions  ? Saxagliptin Diarrhea  ? Epinephrine Other (See Comments)  ?  Patient does not remember what happens when she uses this  ? Atorvastatin Other (See Comments)  ?  Muscle aches  ? Codeine Other (See Comments)  ?  Upset stomach  ? Ezetimibe Other (See Comments)  ?  Myalgias(ZETIA)  ? Limonene Rash  ?  Patient does not recall this reaction  ? Nitrofurantoin Rash and Other (See Comments)  ?  Pruitus  ? Sulfa Antibiotics Rash and Other (See Comments)  ?  Sore mouth   ? ? ?Outpatient Encounter Medications as of 02/06/2022  ?Medication Sig Note  ? acetaminophen (TYLENOL) 650 MG CR tablet Take 1,300 mg by mouth every 8 (eight) hours as needed for pain.   ? albuterol (VENTOLIN HFA) 108 (90 Base) MCG/ACT inhaler Inhale 2 puffs into the lungs every 6 (six) hours as  needed for wheezing or shortness of breath.   ? amLODipine (NORVASC) 5 MG tablet Take 5 mg by mouth in the morning and at bedtime.   ? B-D ULTRAFINE III SHORT PEN 31G X 8 MM MISC USE AS DIRECTED   ? cloNIDine (CATAPRES) 0.1 MG tablet Take 0.1 mg by mouth 2 (two) times daily as needed.   ? Cyanocobalamin 1000 MCG TBCR Take 1,000 mcg by mouth daily.   ? ELIQUIS 2.5 MG TABS tablet TAKE 1 TABLET(2.5 MG) BY MOUTH TWICE DAILY   ? feeding supplement, GLUCERNA SHAKE, (GLUCERNA SHAKE) LIQD Take 237 mLs by mouth 3 (three) times daily between meals.   ? ferrous sulfate 325 (65 FE) MG tablet Take 325 mg by mouth daily with breakfast.   ? gabapentin (NEURONTIN) 100 MG capsule Take 1 capsule (100 mg total) by mouth at bedtime.   ? HUMALOG KWIKPEN 100 UNIT/ML KwikPen Inject 3 Units into the skin 3 (three) times daily.   ? HYDROcodone-acetaminophen (NORCO/VICODIN) 5-325 MG tablet Take 1 tablet by mouth every 6 (six) hours as needed.   ? Insulin Glargine (BASAGLAR KWIKPEN) 100 UNIT/ML INJECT 12 UNITS UNDER THE SKIN DAILY 02/25/2021: Taking 5 units q am ?  ? JARDIANCE 10 MG TABS tablet Take 10 mg by mouth daily.   ? losartan (COZAAR) 25 MG tablet Take 25 mg by mouth 2 (two) times daily. Unsure of dosage   ? melatonin 5 MG TABS Take 0.5 tablets (2.5 mg total) by mouth at bedtime as needed (sleep).   ?  Multiple Vitamins-Minerals (PRESERVISION AREDS PO) Take 1 capsule by mouth 2 (two) times daily.    ? ondansetron (ZOFRAN ODT) 4 MG disintegrating tablet Take 1 tablet (4 mg total) by mouth every 6 (six) hours as needed for nausea or vomiting.   ? promethazine-dextromethorphan (PROMETHAZINE-DM) 6.25-15 MG/5ML syrup TAKE 5 ML BY MOUTH FOUR TIMES DAILY AS NEEDED FOR COUGH   ? REPATHA SURECLICK 315 MG/ML SOAJ Inject into the skin.   ? sertraline (ZOLOFT) 50 MG tablet Take 1 tablet (50 mg total) by mouth daily.   ? silver sulfADIAZINE (SILVADENE) 1 % cream Apply 1 application topically daily. To wound on leg   ? torsemide (DEMADEX) 20 MG tablet  Take 2 tablets (40 mg total) by mouth daily. (Patient taking differently: Take 40 mg by mouth 2 (two) times daily. 1/2 tablet morning and 1/2 tablet at night.)   ? ?No facility-administered encounter medications on file as of 02/06/2022.  ? ? ?Patient Active Problem List  ? Diagnosis Date Noted  ? Acquired thrombophilia ([place]) 03/20/2021  ? Mood disorder ([place]) 05/02/2020  ? Chronic heart failure with preserved ejection fraction ([place]) 04/12/2020  ? Diverticulosis of large intestine without perforation or abscess with bleeding 03/27/2020  ? AF (paroxysmal atrial fibrillation) ([place])   ? Thrush   ? COPD (chronic obstructive pulmonary disease) with chronic bronchitis ([place])   ? CKD (chronic kidney disease), stage IIIa 02/18/2020  ? Malnutrition of mild degree ([place]) 01/08/2020  ? Age-related macular degeneration, dry, left eye 06/21/2019  ? Age-related macular degeneration, wet, right eye ([place]) 06/21/2019  ? Moderate nonproliferative diabetic retinopathy associated with type 2 diabetes mellitus ([place]) 06/21/2019  ? Lumbosacral radiculopathy at L4 05/01/2019  ? Underweight 05/01/2019  ? Encounter for long-term (current) use of aspirin 10/31/2018  ? Encounter for long-term (current) use of antiplatelets/antithrombotics 10/31/2018  ? Long term current use of oral hypoglycemic drug 10/31/2018  ? Encounter for long-term (current) use of insulin ([place]) 10/31/2018  ? GIB (gastrointestinal bleeding) 08/11/2018  ? Leg pain 07/03/2017  ? Carpal tunnel syndrome on both sides 05/05/2017  ? Myalgia due to HMG CoA reductase inhibitor 05/05/2017  ? History of CVA (cerebrovascular accident) 02/15/2017  ? Degenerative disc disease, lumbar 12/21/2016  ? Atherosclerosis of native arteries of extremity with intermittent claudication ([place]) 12/20/2016  ? Bilateral carotid artery stenosis 12/20/2016  ? Occlusion and stenosis of bilateral carotid arteries 12/20/2016  ? Hip bursitis 05/18/2016  ? Elevated TSH 01/18/2016  ? CKD stage 3 due to type 2  diabetes mellitus ([place]) 01/16/2016  ? Senile ecchymosis 01/16/2016  ? Type II diabetes mellitus with renal manifestations ([place]) 09/16/2015  ? GERD (gastroesophageal reflux disease) 07/24/2015  ? PVD (peripheral vascular disease) ([place]) 05/17/2015  ? Neoplasm of uncertain behavior of skin 05/17/2015  ? Retinopathy, diabetic, proliferative ([place]) 04/11/2015  ? Hyperlipidemia associated with type 2 diabetes mellitus ([place]) 04/11/2015  ? Essential hypertension 04/11/2015  ? Generalized OA 04/11/2015  ? Proliferative diabetic retinopathy(362.02) 04/11/2015  ? Arteriosclerosis of coronary artery 05/29/2013  ? Hypertensive heart disease without CHF 05/29/2013  ? ? ?Conditions to be addressed/monitored:CHF, HTN, HLD, COPD, DMII, Depression, and Falls and Chronic pain and discomfort ? ?Care Plan : RNCM: General Plan of Care (Adult) for Chronic Disease Management and Care Coordination Needs  ?Updates made by [name], RN since 02/06/2022 12:00 AM  ?  ? ?Problem: RNCM: Development of Plan of Care for Chronic Disease Management (CHF, DM, HTN, HLD, COPD, falls, depression, Chronic Pain)   ?Priority: High  ?  ? ?  Long-Range Goal: RNCM: Effective Management of Plan of Care for Chronic Disease Management (CHF, DM, HTN, HLD, COPD, falls, depression, Chronic pain)   ?Start Date: 10/15/2021  ?Expected End Date: 10/15/2022  ?Priority: High  ?Note:   ?Current Barriers:  ?Knowledge Deficits related to plan of care for management of CHF, HTN, HLD, COPD, Chronic Pain, Depression: depressed mood ?anxiety, and falls prevention and safety  ?Chronic Disease Management support and education needs related to CHF, HTN, HLD, COPD, Chronic pain, DMII, Depression: depressed mood ?anxiety, and falls prevention and safety ? ?RNCM Clinical Goal(s):  ?Patient will verbalize basic understanding of CHF, HTN, HLD, COPD, Chronic pain, DMII, Depression, and Falls prevention and safety  disease process and self health management plan as evidenced by  following the plan of care, taking medications as directed, following dietary restrictions, and working with the pcp and CCM team to optimize health and well being ?take all medications exactly as prescribed and w

## 2022-02-06 NOTE — Patient Instructions (Signed)
Visit Information ? ?Thank you for taking time to visit with me today. Please don't hesitate to contact me if I can be of assistance to you before our next scheduled telephone appointment. ? ?Following are the goals we discussed today:  ?RNCM Clinical Goal(s):  ?Patient will verbalize basic understanding of CHF, HTN, HLD, COPD, Chronic pain, DMII, Depression, and Falls prevention and safety  disease process and self health management plan as evidenced by following the plan of care, taking medications as directed, following dietary restrictions, and working with the pcp and CCM team to optimize health and well being ?take all medications exactly as prescribed and will call provider for medication related questions as evidenced by compliance with medications and calling for refills before running out of medications    ?demonstrate understanding of rationale for each prescribed medication as evidenced by compliance with medications regimen    ?attend all scheduled medical appointments: 03-24-2022 with pcp, specialist appointments coming up, as evidenced by keeping appointments and calling for schedule change needs         ?demonstrate improved and ongoing adherence to prescribed treatment plan for CHF, HTN, HLD, COPD, DMII, Chronic pain, Depression, and falls prevention and safety  as evidenced by stable conditions and calling the office for questions or changes  ?demonstrate a decrease in CHF, HTN, HLD, COPD, DMII, Depression, Chronic pain, and falls  exacerbations  as evidenced by stable conditions, normal VS, normal A1C, and regular outreaches to help with effective management of health and well being  ?demonstrate ongoing self health care management ability for effective management of chronic conditions  as evidenced by  working with the CCM team  through collaboration with Consulting civil engineer, provider, and care team.  ?  ?Interventions: ?1:1 collaboration with primary care provider regarding development and update of  comprehensive plan of care as evidenced by provider attestation and co-signature ?Inter-disciplinary care team collaboration (see longitudinal plan of care) ?Evaluation of current treatment plan related to  self management and patient's adherence to plan as established by provider ?  ?  ?Heart Failure Interventions:  (Status: Goal on Track (progressing): YES.)  Long Term Goal  ?Basic overview and discussion of pathophysiology of Heart Failure reviewed. 02-06-2022: The patient is compliant with the plan of care for effective management of HF. The patient states that she got clearance for back surgery recently; however she has decided not to have it now. She is happy with that decision.  ?Provided education on low sodium diet. 02-06-2022: The patient is compliant with a heart healthy/ADA diet ?Reviewed Heart Failure Action Plan in depth and provided written copy ?Assessed need for readable accurate scales in home. 02-06-2022: Weighs safely at home and weight is stable. Denies any fluctuations ?Provided education about placing scale on hard, flat surface ?Advised patient to weigh each morning after emptying bladder. 02-06-2022: The patient has a stable weight of 117. Review of safety when weighing due to high risk for falls and injury ?Discussed importance of daily weight and advised patient to weigh and record daily ?Reviewed role of diuretics in prevention of fluid overload and management of heart failure ?Discussed the importance of keeping all appointments with provider. Has seen cardiology recently and has a follow up with her vascular surgeon later this month. Will continue to monitor for changes and new needs.  ?Provided patient with education about the role of exercise in the management of heart failure ?Screening for signs and symptoms of depression related to chronic disease state  ?Assessed social  determinant of health barriers ?  ?COPD: (Status: Goal on Track (progressing): YES.) Long Term Goal  ?Reviewed  medications with patient, including use of prescribed maintenance and rescue inhalers, and provided instruction on medication management and the importance of adherence. 4-14--2023: The patient is compliant with medications. Denies any new concerns with COPD medication management. Denies any acute changes in COPD and effective management of COPD.  ?Provided patient with basic written and verbal COPD education on self care/management/and exacerbation prevention ?Advised patient to track and manage COPD triggers. 02-06-2022: The patient knows factors that trigger her COPD. She is stable with her COPD at this time. Will continue to monitor.  ?Provided written and verbal instructions on pursed lip breathing and utilized returned demonstration as teach back ?Provided instruction about proper use of medications used for management of COPD including inhalers ?Advised patient to self assesses COPD action plan zone and make appointment with provider if in the yellow zone for 48 hours without improvement ?Advised patient to engage in light exercise as tolerated 3-5 days a week to aid in the the management of COPD ?Provided education about and advised patient to utilize infection prevention strategies to reduce risk of respiratory infection. 02-06-2022: The patient denies any issues with increase risk of infection at this time. Review of staying safe and monitoring for high risk areas that could cause exacerbation of COPD.  ?Discussed the importance of adequate rest and management of fatigue with COPD ?  ?Diabetes:  (Status: Goal on Track (progressing): YES.) Long Term Goal  ?  ?     ?Lab Results  ?Component Value Date  ?  HGBA1C 7.5 06/16/2021  ?Recent A1C at endocrinology office was 7.4 on 10-31-2021 ?Assessed patient's understanding of A1c goal: <7% ?Provided education to patient about basic DM disease process; ?Reviewed medications with patient and discussed importance of medication adherence. 02-06-2022: The patient is  compliant with medications;        ?Reviewed prescribed diet with patient heart healthy/ADA. 02-06-2022: The patient is compliant with heart healthy/ADA diet; ?Counseled on importance of regular laboratory monitoring as prescribed. 02-06-2022: The patient has regular lab work. Last at endocrinology with A1C of 7.4        ?Discussed plans with patient for ongoing care management follow up and provided patient with direct contact information for care management team;      ?Provided patient with written educational materials related to hypo and hyperglycemia and importance of correct treatment. 02-06-2022: The patient denies any lows or highs at this time. Is checking her blood sugars 4 times a day.        ?Reviewed scheduled/upcoming provider appointments including: 03-24-2022 with pcp, saw endocrinologist on 10-31-2021;         ?Advised patient, providing education and rationale, to check cbg before meals and at bedtime, when you have symptoms of low or high blood sugar, before and after exercise, and the patient has a continuous glucose reader   and record. 10-15-2021: The patient states that she has been having to keep it on her right arm and it is a little sore because she had a fall and had a scabbed area on her left arm. Education on rotating sites and that likely it is sore due to keeping it on the same arm. Education and support given. States her readings have been 108 to 160. Denies any real lows or real highs. Knows how to effectively manage hypoglycemic or hyperglycemic events. 12-04-2021: The patient is waiting on her [place] supplies. She  states it has been 2 weeks since she ordered them . She has been manually checking her blood sugars. States this am was 199. She denies any acute findings related to her DM and blood sugars. Knows how to effectively manage. States her average is about 199. 02-06-2022: The patient is doing well with effective management of her DM. States she woke up the other night and was  concerned about her blood sugars but it was 140. Denies any acute findings related to blood sugars or DM health and well being.    ?call provider for findings outside established parameters;       ?Review of pat

## 2022-02-09 ENCOUNTER — Other Ambulatory Visit: Payer: Self-pay | Admitting: Internal Medicine

## 2022-02-09 DIAGNOSIS — M5417 Radiculopathy, lumbosacral region: Secondary | ICD-10-CM

## 2022-02-09 NOTE — Telephone Encounter (Signed)
Requested medications are due for refill today.  yes ? ?Requested medications are on the active medications list.  yes ? ?Last refill. 09/12/2021 #90 1 refill ? ?Future visit scheduled.   yes ? ?Notes to clinic.  Medication failed refill protocol d/t abnormal labs. ? ? ? ?Requested Prescriptions  ?Pending Prescriptions Disp Refills  ? gabapentin (NEURONTIN) 100 MG capsule [Pharmacy Med Name: GABAPENTIN '100MG'$  CAPSULES] 90 capsule 1  ?  Sig: TAKE 1 CAPSULE(100 MG) BY MOUTH AT BEDTIME  ?  ? Neurology: Anticonvulsants - gabapentin Failed - 02/09/2022 11:19 AM  ?  ?  Failed - Cr in normal range and within 360 days  ?  Creatinine  ?Date Value Ref Range Status  ?08/19/2021 1.5 (A) 0.5 - 1.1 Final  ?05/29/2014 1.10 0.60 - 1.30 mg/dL Final  ? ?Creatinine, Ser  ?Date Value Ref Range Status  ?12/25/2020 1.65 (H) 0.57 - 1.00 mg/dL Final  ?  ?  ?  ?  Passed - Completed PHQ-2 or PHQ-9 in the last 360 days  ?  ?  Passed - Valid encounter within last 12 months  ?  Recent Outpatient Visits   ? ?      ? 3 months ago Cellulitis of left lower extremity  ? [facility] [name], MD  ? 5 months ago Essential hypertension  ? [facility] [name], MD  ? 9 months ago Cellulitis of left lower extremity  ? [facility] [name], MD  ? 9 months ago Localized edema  ? [facility] [name], MD  ? 10 months ago Essential hypertension  ? [facility] [name], MD  ? ?  ?  ?Future Appointments   ? ?        ? In 1 month Army Melia Jesse Sans, MD [facility] [place]  ? ?  ? ? ?  ?  ?  ?  ?

## 2022-02-10 ENCOUNTER — Other Ambulatory Visit: Payer: Self-pay | Admitting: Internal Medicine

## 2022-02-10 DIAGNOSIS — F39 Unspecified mood [affective] disorder: Secondary | ICD-10-CM

## 2022-02-10 NOTE — Telephone Encounter (Signed)
Requested medications are due for refill today.  yes ? ?Requested medications are on the active medications list.  yes ? ?Last refill. 09/04/2021 #30 3 refills ? ?Future visit scheduled.   yes ? ?Notes to clinic.  Medication refill is not delegated. ? ? ? ?Requested Prescriptions  ?Pending Prescriptions Disp Refills  ? sertraline (ZOLOFT) 50 MG tablet [Pharmacy Med Name: SERTRALINE '50MG'$  TABLETS] 30 tablet 3  ?  Sig: TAKE 1 TABLET(50 MG) BY MOUTH DAILY  ?  ? Not Delegated - Psychiatry:  Antidepressants - SSRI - sertraline Failed - 02/10/2022  8:30 AM  ?  ?  Failed - This refill cannot be delegated  ?  ?  Failed - AST in normal range and within 360 days  ?  AST  ?Date Value Ref Range Status  ?12/25/2020 23 0 - 40 IU/L Final  ? ?SGOT(AST)  ?Date Value Ref Range Status  ?05/03/2013 24 15 - 37 Unit/L Final  ?  ?  ?  ?  Failed - ALT in normal range and within 360 days  ?  ALT  ?Date Value Ref Range Status  ?12/25/2020 25 0 - 32 IU/L Final  ? ?SGPT (ALT)  ?Date Value Ref Range Status  ?05/03/2013 20 12 - 78 U/L Final  ?  ?  ?  ?  Passed - Completed PHQ-2 or PHQ-9 in the last 360 days  ?  ?  Passed - Valid encounter within last 6 months  ?  Recent Outpatient Visits   ? ?      ? 3 months ago Cellulitis of left lower extremity  ? [facility] [name], MD  ? 5 months ago Essential hypertension  ? [facility] [name], MD  ? 9 months ago Cellulitis of left lower extremity  ? [facility] [name], MD  ? 9 months ago Localized edema  ? [facility] [name], MD  ? 10 months ago Essential hypertension  ? [facility] [name], MD  ? ?  ?  ?Future Appointments   ? ?        ? In 1 month Army Melia Jesse Sans, MD [facility] [place]  ? ?  ? ? ?  ?  ?  ?  ?

## 2022-02-13 DIAGNOSIS — Z794 Long term (current) use of insulin: Secondary | ICD-10-CM | POA: Diagnosis not present

## 2022-02-13 DIAGNOSIS — E119 Type 2 diabetes mellitus without complications: Secondary | ICD-10-CM | POA: Diagnosis not present

## 2022-02-17 DIAGNOSIS — H353211 Exudative age-related macular degeneration, right eye, with active choroidal neovascularization: Secondary | ICD-10-CM | POA: Diagnosis not present

## 2022-02-22 DIAGNOSIS — I110 Hypertensive heart disease with heart failure: Secondary | ICD-10-CM

## 2022-02-22 DIAGNOSIS — Z7984 Long term (current) use of oral hypoglycemic drugs: Secondary | ICD-10-CM

## 2022-02-22 DIAGNOSIS — I509 Heart failure, unspecified: Secondary | ICD-10-CM

## 2022-02-22 DIAGNOSIS — F32A Depression, unspecified: Secondary | ICD-10-CM

## 2022-02-22 DIAGNOSIS — E1159 Type 2 diabetes mellitus with other circulatory complications: Secondary | ICD-10-CM

## 2022-02-22 DIAGNOSIS — J449 Chronic obstructive pulmonary disease, unspecified: Secondary | ICD-10-CM | POA: Diagnosis not present

## 2022-02-22 DIAGNOSIS — E785 Hyperlipidemia, unspecified: Secondary | ICD-10-CM | POA: Diagnosis not present

## 2022-02-23 ENCOUNTER — Ambulatory Visit (INDEPENDENT_AMBULATORY_CARE_PROVIDER_SITE_OTHER): Payer: Medicare HMO | Admitting: Internal Medicine

## 2022-02-23 ENCOUNTER — Encounter: Payer: Self-pay | Admitting: Internal Medicine

## 2022-02-23 VITALS — BP 122/44 | HR 80 | Ht 61.0 in | Wt 107.0 lb

## 2022-02-23 DIAGNOSIS — R634 Abnormal weight loss: Secondary | ICD-10-CM | POA: Diagnosis not present

## 2022-02-23 DIAGNOSIS — F39 Unspecified mood [affective] disorder: Secondary | ICD-10-CM

## 2022-02-23 DIAGNOSIS — E1122 Type 2 diabetes mellitus with diabetic chronic kidney disease: Secondary | ICD-10-CM

## 2022-02-23 DIAGNOSIS — I10 Essential (primary) hypertension: Secondary | ICD-10-CM | POA: Diagnosis not present

## 2022-02-23 DIAGNOSIS — N1831 Chronic kidney disease, stage 3a: Secondary | ICD-10-CM

## 2022-02-23 MED ORDER — MIRTAZAPINE 15 MG PO TABS: 15.0000 mg | ORAL_TABLET | Freq: Every day | ORAL | 0 refills | Status: DC

## 2022-02-23 NOTE — Patient Instructions (Addendum)
Lantus 5 units every AM. ?Continue to take Humalog as needed with meals. ? ?Stop Jardiance if you desire. ? ? ?

## 2022-02-23 NOTE — Progress Notes (Signed)
? ? ?Date:  02/23/2022  ? ?Name:  Jill Hudson   DOB:  05/21/1935   MRN:  [id number] ? ? ?Chief Complaint: Hypertension, Weight Loss, and Diabetes ? ?Depression ?       This is a chronic problem.The problem is unchanged.  Associated symptoms include appetite change.  Associated symptoms include no fatigue and no headaches.  Past treatments include SSRIs - Selective serotonin reuptake inhibitors (sertraline).  Compliance with treatment is good.  Previous treatment provided moderate relief. ?Hypertension ?This is a chronic problem. The problem is controlled. Pertinent negatives include no chest pain, headaches, palpitations or shortness of breath. Past treatments include calcium channel blockers, angiotensin blockers and diuretics. The current treatment provides significant improvement. Hypertensive end-organ damage includes CAD/MI.  ?Weight loss - ongoing.  Last weight here was 120 lbs.  Today down to 107 lbs.  She has decided that Jill Hudson is the cause of her weight loss, despite poor appetite.  She has also stopped taking insulin regularly.  She occasionally takes 1 unit of Humalog. ? ?Lab Results  ?Component Value Date  ? NA 133 (A) 08/19/2021  ? K 4.9 08/19/2021  ? CO2 26 (A) 08/19/2021  ? GLUCOSE 299 (H) 12/25/2020  ? BUN 33 (A) 08/19/2021  ? CREATININE 1.5 (A) 08/19/2021  ? CALCIUM 9.5 12/25/2020  ? EGFR 30 (L) 12/25/2020  ? GFRNONAA 35 08/19/2021  ? ?Lab Results  ?Component Value Date  ? CHOL 80 08/19/2021  ? HDL 56 08/19/2021  ? [place] 1 08/19/2021  ? TRIG 115 08/19/2021  ? CHOLHDL 2.1 02/19/2020  ? ?Lab Results  ?Component Value Date  ? TSH 3.720 12/25/2020  ? ?Lab Results  ?Component Value Date  ? HGBA1C 7.4 10/31/2021  ? ?Lab Results  ?Component Value Date  ? WBC 8.3 12/25/2020  ? HGB 10.7 (L) 12/25/2020  ? HCT 33.3 (L) 12/25/2020  ? MCV 92 12/25/2020  ? PLT 251 12/25/2020  ? ?Lab Results  ?Component Value Date  ? ALT 25 12/25/2020  ? AST 23 12/25/2020  ? ALKPHOS 107 12/25/2020  ? BILITOT <0.2 12/25/2020   ? ?No results found for: 25OHVITD2, [place], VD25OH  ? ?Review of Systems  ?Constitutional:  Positive for activity change, appetite change and unexpected weight change. Negative for chills, diaphoresis and fatigue.  ?HENT:  Negative for trouble swallowing.   ?Respiratory:  Negative for chest tightness and shortness of breath.   ?Cardiovascular:  Positive for leg swelling. Negative for chest pain and palpitations.  ?Gastrointestinal:  Negative for abdominal pain, constipation and diarrhea.  ?Genitourinary:  Negative for dysuria and frequency.  ?Musculoskeletal:  Positive for arthralgias and gait problem.  ?Neurological:  Negative for dizziness, light-headedness and headaches.  ?Psychiatric/Behavioral:  Positive for depression, dysphoric mood and sleep disturbance. The patient is nervous/anxious.   ? ?Patient Active Problem List  ? Diagnosis Date Noted  ? Acquired thrombophilia ([place]) 03/20/2021  ? Mood disorder ([place]) 05/02/2020  ? Chronic heart failure with preserved ejection fraction ([place]) 04/12/2020  ? Diverticulosis of large intestine without perforation or abscess with bleeding 03/27/2020  ? AF (paroxysmal atrial fibrillation) ([place])   ? Thrush   ? COPD (chronic obstructive pulmonary disease) with chronic bronchitis ([place])   ? CKD (chronic kidney disease), stage IIIa 02/18/2020  ? Malnutrition of mild degree ([place]) 01/08/2020  ? Age-related macular degeneration, dry, left eye 06/21/2019  ? Age-related macular degeneration, wet, right eye ([place]) 06/21/2019  ? Moderate nonproliferative diabetic retinopathy associated with type 2 diabetes mellitus ([place]) 06/21/2019  ?  Lumbosacral radiculopathy at L4 05/01/2019  ? Underweight 05/01/2019  ? Encounter for long-term (current) use of aspirin 10/31/2018  ? Encounter for long-term (current) use of antiplatelets/antithrombotics 10/31/2018  ? Long term current use of oral hypoglycemic drug 10/31/2018  ? Encounter for long-term (current) use of insulin ([place]) 10/31/2018  ? GIB  (gastrointestinal bleeding) 08/11/2018  ? Leg pain 07/03/2017  ? Carpal tunnel syndrome on both sides 05/05/2017  ? Myalgia due to HMG CoA reductase inhibitor 05/05/2017  ? History of CVA (cerebrovascular accident) 02/15/2017  ? Degenerative disc disease, lumbar 12/21/2016  ? Atherosclerosis of native arteries of extremity with intermittent claudication ([place]) 12/20/2016  ? Bilateral carotid artery stenosis 12/20/2016  ? Occlusion and stenosis of bilateral carotid arteries 12/20/2016  ? Hip bursitis 05/18/2016  ? Elevated TSH 01/18/2016  ? CKD stage 3 due to type 2 diabetes mellitus ([place]) 01/16/2016  ? Senile ecchymosis 01/16/2016  ? Type II diabetes mellitus with renal manifestations ([place]) 09/16/2015  ? GERD (gastroesophageal reflux disease) 07/24/2015  ? PVD (peripheral vascular disease) ([place]) 05/17/2015  ? Neoplasm of uncertain behavior of skin 05/17/2015  ? Retinopathy, diabetic, proliferative ([place]) 04/11/2015  ? Hyperlipidemia associated with type 2 diabetes mellitus ([place]) 04/11/2015  ? Essential hypertension 04/11/2015  ? Generalized OA 04/11/2015  ? Proliferative diabetic retinopathy(362.02) 04/11/2015  ? Arteriosclerosis of coronary artery 05/29/2013  ? Hypertensive heart disease without CHF 05/29/2013  ? ? ?Allergies  ?Allergen Reactions  ? Saxagliptin Diarrhea  ? Epinephrine Other (See Comments)  ?  Patient does not remember what happens when she uses this  ? Atorvastatin Other (See Comments)  ?  Muscle aches  ? Codeine Other (See Comments)  ?  Upset stomach  ? Ezetimibe Other (See Comments)  ?  Myalgias(ZETIA)  ? Limonene Rash  ?  Patient does not recall this reaction  ? Nitrofurantoin Rash and Other (See Comments)  ?  Pruitus  ? Sulfa Antibiotics Rash and Other (See Comments)  ?  Sore mouth   ? ? ?Past Surgical History:  ?Procedure Laterality Date  ? CARDIAC CATHETERIZATION  1998  ? 40% LM, 95% Ramus interm  ? CATARACT EXTRACTION, BILATERAL    ? COLONOSCOPY WITH PROPOFOL N/A 08/12/2018  ? Procedure:  COLONOSCOPY WITH PROPOFOL;  Surgeon: Lucilla Lame, MD;  Location: [facility] ENDOSCOPY;  Service: Endoscopy;  Laterality: N/A;  ? ENDARTERECTOMY FEMORAL Left 10/12/2018  ? Procedure: ENDARTERECTOMY FEMORAL;  Surgeon: Katha Cabal, MD;  Location: [facility] ORS;  Service: Vascular;  Laterality: Left;  angioplasty and left SFA stent placement  ? EYE SURGERY Bilateral   ? cataract extractions  ? LOWER EXTREMITY ANGIOGRAPHY Left 08/23/2017  ? Procedure: Lower Extremity Angiography;  Surgeon: Algernon Huxley, MD;  Location: [place] CV LAB;  Service: Cardiovascular;  Laterality: Left;  ? LOWER EXTREMITY ANGIOGRAPHY Left 07/26/2018  ? Procedure: LOWER EXTREMITY ANGIOGRAPHY;  Surgeon: Katha Cabal, MD;  Location: [place] CV LAB;  Service: Cardiovascular;  Laterality: Left;  ? LOWER EXTREMITY ANGIOGRAPHY Left 09/16/2018  ? Procedure: LOWER EXTREMITY ANGIOGRAPHY;  Surgeon: Katha Cabal, MD;  Location: [place] CV [facility];  Service: Cardiovascular;  Laterality: Left;  ? PTCA  08/2013  ? Left common iliac  ? PTCA  12/2012  ? left ext iliac  ? RIGHT HEART CATH N/A 02/23/2020  ? Procedure: RIGHT HEART CATH;  Surgeon: Minna Merritts, MD;  Location: [place] [facility] LAB;  Service: Cardiovascular;  Laterality: N/A;  ? TUBAL LIGATION    ? ? ?Social History  ? ?  Tobacco Use  ? Smoking status: Former  ?  Packs/day: 2.00  ?  Years: 37.00  ?  Pack years: 74.00  ?  Types: Cigarettes  ?  Quit date: 6  ?  Years since quitting: 43.3  ? Smokeless tobacco: Never  ? Tobacco comments:  ?  smoking cessation materials not required  ?Vaping Use  ? Vaping Use: Never used  ?Substance Use Topics  ? Alcohol use: No  ?  Alcohol/week: 0.0 standard drinks  ? Drug use: No  ? ? ? ?Medication list has been reviewed and updated. ? ?Current Meds  ?Medication Sig  ? acetaminophen (TYLENOL) 650 MG CR tablet Take 1,300 mg by mouth every 8 (eight) hours as needed for pain.  ? amLODipine (NORVASC) 5 MG tablet Take 5 mg by mouth in the morning and  at bedtime.  ? B-D ULTRAFINE III SHORT PEN 31G X 8 MM MISC USE AS DIRECTED  ? cloNIDine (CATAPRES) 0.1 MG tablet Take 0.1 mg by mouth 2 (two) times daily as needed.  ? Cyanocobalamin 1000 MCG TBCR Take 1,000

## 2022-02-25 LAB — MICROALBUMIN / CREATININE URINE RATIO
Creatinine, Urine: 16.8 mg/dL
Microalb/Creat Ratio: 158 mg/g creat — ABNORMAL HIGH (ref 0–29)
Microalbumin, Urine: 26.5 ug/mL

## 2022-03-11 ENCOUNTER — Ambulatory Visit (INDEPENDENT_AMBULATORY_CARE_PROVIDER_SITE_OTHER): Payer: Medicare HMO

## 2022-03-11 DIAGNOSIS — Z0000 Encounter for general adult medical examination without abnormal findings: Secondary | ICD-10-CM

## 2022-03-11 NOTE — Patient Instructions (Signed)
Ms. Morocco , ?Thank you for taking time to come for your Medicare Wellness Visit. I appreciate your ongoing commitment to your health goals. Please review the following plan we discussed and let me know if I can assist you in the future.  ? ?Screening recommendations/referrals: ?Colonoscopy: no longer required ?Mammogram: done 04/11/21 ?Bone Density: done 07/06/18 ?Recommended yearly ophthalmology/optometry visit for glaucoma screening and checkup ?Recommended yearly dental visit for hygiene and checkup ? ?Vaccinations: ?Influenza vaccine: done 09/04/21 ?Pneumococcal vaccine: done 09/10/14 ?Tdap vaccine: done 05/02/21 ?Shingles vaccine: Shingrix discussed. Please contact your pharmacy for coverage information.  ?Covid-19: 12/01/19, 12/22/19 & 10/22/20 ? ?Advanced directives: Please bring a copy of your health care power of attorney and living will to the office at your convenience.  ? ?Conditions/risks identified: recommend continuing fall prevention at home ? ?Next appointment: Follow up in one year for your annual wellness visit  ? ? ?Preventive Care 20 Years and Older, Female ?Preventive care refers to lifestyle choices and visits with your health care provider that can promote health and wellness. ?What does preventive care include? ?A yearly physical exam. This is also called an annual well check. ?Dental exams once or twice a year. ?Routine eye exams. Ask your health care provider how often you should have your eyes checked. ?Personal lifestyle choices, including: ?Daily care of your teeth and gums. ?Regular physical activity. ?Eating a healthy diet. ?Avoiding tobacco and drug use. ?Limiting alcohol use. ?Practicing safe sex. ?Taking low-dose aspirin every day. ?Taking vitamin and mineral supplements as recommended by your health care provider. ?What happens during an annual well check? ?The services and screenings done by your health care provider during your annual well check will depend on your age, overall health,  lifestyle risk factors, and family history of disease. ?Counseling  ?Your health care provider may ask you questions about your: ?Alcohol use. ?Tobacco use. ?Drug use. ?Emotional well-being. ?Home and relationship well-being. ?Sexual activity. ?Eating habits. ?History of falls. ?Memory and ability to understand (cognition). ?Work and work Statistician. ?Reproductive health. ?Screening  ?You may have the following tests or measurements: ?Height, weight, and BMI. ?Blood pressure. ?Lipid and cholesterol levels. These may be checked every 5 years, or more frequently if you are over 7 years old. ?Skin check. ?Lung cancer screening. You may have this screening every year starting at age 40 if you have a 30-pack-year history of smoking and currently smoke or have quit within the past 15 years. ?Fecal occult blood test (FOBT) of the stool. You may have this test every year starting at age 34. ?Flexible sigmoidoscopy or colonoscopy. You may have a sigmoidoscopy every 5 years or a colonoscopy every 10 years starting at age 64. ?Hepatitis C blood test. ?Hepatitis B blood test. ?Sexually transmitted disease (STD) testing. ?Diabetes screening. This is done by checking your blood sugar (glucose) after you have not eaten for a while (fasting). You may have this done every 1-3 years. ?Bone density scan. This is done to screen for osteoporosis. You may have this done starting at age 90. ?Mammogram. This may be done every 1-2 years. Talk to your health care provider about how often you should have regular mammograms. ?Talk with your health care provider about your test results, treatment options, and if necessary, the need for more tests. ?Vaccines  ?Your health care provider may recommend certain vaccines, such as: ?Influenza vaccine. This is recommended every year. ?Tetanus, diphtheria, and acellular pertussis (Tdap, Td) vaccine. You may need a Td booster every 10 years. ?Zoster  vaccine. You may need this after age  33. ?Pneumococcal 13-valent conjugate (PCV13) vaccine. One dose is recommended after age 24. ?Pneumococcal polysaccharide (PPSV23) vaccine. One dose is recommended after age 81. ?Talk to your health care provider about which screenings and vaccines you need and how often you need them. ?This information is not intended to replace advice given to you by your health care provider. Make sure you discuss any questions you have with your health care provider. ?Document Released: 11/08/2015 Document Revised: 07/01/2016 Document Reviewed: 08/13/2015 ?Elsevier Interactive Patient Education ? 2017 [place]. ? ?Fall Prevention in the Home ?Falls can cause injuries. They can happen to people of all ages. There are many things you can do to make your home safe and to help prevent falls. ?What can I do on the outside of my home? ?Regularly fix the edges of walkways and driveways and fix any cracks. ?Remove anything that might make you trip as you walk through a door, such as a raised step or threshold. ?Trim any bushes or trees on the path to your home. ?Use bright outdoor lighting. ?Clear any walking paths of anything that might make someone trip, such as rocks or tools. ?Regularly check to see if handrails are loose or broken. Make sure that both sides of any steps have handrails. ?Any raised decks and porches should have guardrails on the edges. ?Have any leaves, snow, or ice cleared regularly. ?Use sand or salt on walking paths during winter. ?Clean up any spills in your garage right away. This includes oil or grease spills. ?What can I do in the bathroom? ?Use night lights. ?Install grab bars by the toilet and in the tub and shower. Do not use towel bars as grab bars. ?Use non-skid mats or decals in the tub or shower. ?If you need to sit down in the shower, use a plastic, non-slip stool. ?Keep the floor dry. Clean up any water that spills on the floor as soon as it happens. ?Remove soap buildup in the tub or shower  regularly. ?Attach bath mats securely with double-sided non-slip rug tape. ?Do not have throw rugs and other things on the floor that can make you trip. ?What can I do in the bedroom? ?Use night lights. ?Make sure that you have a light by your bed that is easy to reach. ?Do not use any sheets or blankets that are too big for your bed. They should not hang down onto the floor. ?Have a firm chair that has side arms. You can use this for support while you get dressed. ?Do not have throw rugs and other things on the floor that can make you trip. ?What can I do in the kitchen? ?Clean up any spills right away. ?Avoid walking on wet floors. ?Keep items that you use a lot in easy-to-reach places. ?If you need to reach something above you, use a strong step stool that has a grab bar. ?Keep electrical cords out of the way. ?Do not use floor polish or wax that makes floors slippery. If you must use wax, use non-skid floor wax. ?Do not have throw rugs and other things on the floor that can make you trip. ?What can I do with my stairs? ?Do not leave any items on the stairs. ?Make sure that there are handrails on both sides of the stairs and use them. Fix handrails that are broken or loose. Make sure that handrails are as long as the stairways. ?Check any carpeting to make sure that it  is firmly attached to the stairs. Fix any carpet that is loose or worn. ?Avoid having throw rugs at the top or bottom of the stairs. If you do have throw rugs, attach them to the floor with carpet tape. ?Make sure that you have a light switch at the top of the stairs and the bottom of the stairs. If you do not have them, ask someone to add them for you. ?What else can I do to help prevent falls? ?Wear shoes that: ?Do not have high heels. ?Have rubber bottoms. ?Are comfortable and fit you well. ?Are closed at the toe. Do not wear sandals. ?If you use a stepladder: ?Make sure that it is fully opened. Do not climb a closed stepladder. ?Make sure that  both sides of the stepladder are locked into place. ?Ask someone to hold it for you, if possible. ?Clearly mark and make sure that you can see: ?Any grab bars or handrails. ?First and last steps. ?Where the edge of each

## 2022-03-11 NOTE — Progress Notes (Signed)
? ?Subjective:  ? Jill Hudson is a 87 y.o. female who presents for Medicare Annual (Subsequent) preventive examination. ? ?Virtual Visit via Telephone Note ? ?I connected with  Jill Hudson on 03/11/22 at  1:00 PM EDT by telephone and verified that I am speaking with the correct person using two identifiers. ? ?Location: ?Patient: home ?Provider: [facility] ?Persons participating in the virtual visit: patient/Nurse Health Advisor ?  ?I discussed the limitations, risks, security and privacy concerns of performing an evaluation and management service by telephone and the availability of in person appointments. The patient expressed understanding and agreed to proceed. ? ?Interactive audio and video telecommunications were attempted between this nurse and patient, however failed, due to patient having technical difficulties OR patient did not have access to video capability.  We continued and completed visit with audio only. ? ?Some vital signs may be absent or patient reported.  ? ?Clemetine Marker, LPN ? ? ?Review of Systems    ? ?Cardiac Risk Factors include: advanced age (>46mn, >>39women);diabetes mellitus;dyslipidemia;hypertension ? ?   ?Objective:  ?  ?There were no vitals filed for this visit. ?There is no height or weight on file to calculate BMI. ? ? ?  03/11/2022  ?  1:24 PM 10/31/2021  ?  4:16 PM 05/01/2021  ? 10:48 PM 09/04/2020  ?  2:11 PM 07/20/2020  ?  4:03 PM 03/08/2020  ?  1:35 PM 02/23/2020  ?  1:03 PM  ?Advanced Directives  ?Does Patient Have a Medical Advance Directive? Yes Yes No Yes No Yes Yes  ?Type of [place]of [place] will [place]  [place]  Living will;Healthcare Power of Attorney Living will;Healthcare Power of Attorney  ?Does patient want to make changes to medical advance directive?      No - Patient declined No - Patient declined  ?Copy of [place]? No - copy requested   No -  copy requested  No - copy requested No - copy requested  ? ? ?Current Medications (verified) ?Outpatient Encounter Medications as of 03/11/2022  ?Medication Sig  ? acetaminophen (TYLENOL) 650 MG CR tablet Take 1,300 mg by mouth every 8 (eight) hours as needed for pain.  ? amLODipine (NORVASC) 5 MG tablet Take 5 mg by mouth in the morning and at bedtime.  ? B-D ULTRAFINE III SHORT PEN 31G X 8 MM MISC USE AS DIRECTED  ? cloNIDine (CATAPRES) 0.1 MG tablet Take 0.1 mg by mouth 2 (two) times daily as needed.  ? Cyanocobalamin 1000 MCG TBCR Take 1,000 mcg by mouth daily.  ? ELIQUIS 2.5 MG TABS tablet TAKE 1 TABLET(2.5 MG) BY MOUTH TWICE DAILY  ? feeding supplement, GLUCERNA SHAKE, (GLUCERNA SHAKE) LIQD Take 237 mLs by mouth 3 (three) times daily between meals.  ? ferrous sulfate 325 (65 FE) MG tablet Take 325 mg by mouth daily with breakfast.  ? gabapentin (NEURONTIN) 100 MG capsule Take 1 capsule (100 mg total) by mouth at bedtime.  ? HUMALOG KWIKPEN 100 UNIT/ML KwikPen Inject 3 Units into the skin 3 (three) times daily.  ? insulin glargine (LANTUS) 100 UNIT/ML injection Inject 5 Units into the skin daily.  ? JARDIANCE 10 MG TABS tablet Take 10 mg by mouth daily.  ? losartan (COZAAR) 25 MG tablet Take 25 mg by mouth 2 (two) times daily. Unsure of dosage  ? melatonin 5 MG TABS Take 0.5 tablets (2.5 mg total) by mouth  at bedtime as needed (sleep).  ? mirtazapine (REMERON) 15 MG tablet Take 1 tablet (15 mg total) by mouth at bedtime.  ? Multiple Vitamins-Minerals (PRESERVISION AREDS PO) Take 1 capsule by mouth 2 (two) times daily.   ? promethazine-dextromethorphan (PROMETHAZINE-DM) 6.25-15 MG/5ML syrup TAKE 5 ML BY MOUTH FOUR TIMES DAILY AS NEEDED FOR COUGH  ? sertraline (ZOLOFT) 50 MG tablet TAKE 1 TABLET(50 MG) BY MOUTH DAILY  ? torsemide (DEMADEX) 20 MG tablet Take 2 tablets (40 mg total) by mouth daily. (Patient taking differently: Take 40 mg by mouth 2 (two) times daily. 1/2 tablet morning and 1/2 tablet at night.)  ?  [DISCONTINUED] ondansetron (ZOFRAN ODT) 4 MG disintegrating tablet Take 1 tablet (4 mg total) by mouth every 6 (six) hours as needed for nausea or vomiting.  ? [DISCONTINUED] silver sulfADIAZINE (SILVADENE) 1 % cream Apply 1 application topically daily. To wound on leg  ? ?No facility-administered encounter medications on file as of 03/11/2022.  ? ? ?Allergies (verified) ?Saxagliptin, Epinephrine, Atorvastatin, Codeine, Ezetimibe, Limonene, Nitrofurantoin, and Sulfa antibiotics  ? ?History: ?Past Medical History:  ?Diagnosis Date  ? Acute CHF (congestive heart failure) ([place]) 02/18/2020  ? Acute diastolic CHF (congestive heart failure) ([place])   ? Acute kidney injury superimposed on CKD ([place])   ? Allergies   ? Anxiety   ? Arthritis   ? spine and shoulder  ? Atherosclerosis of artery of extremity with rest pain ([place]) 10/12/2018  ? Cancer [facility])   ? skin  ? CHF (congestive heart failure) ([place])   ? Diabetes mellitus without complication ([place])   ? Diverticulosis of large intestine with hemorrhage   ? GI bleeding 12/25/2019  ? Heart murmur   ? Hyperlipidemia   ? Hypertension   ? Macula lutea degeneration   ? Mitral and aortic valve disease   ? Myocardial infarction [facility])   ? may have had a "light" heart attack  ? Non-ST elevation (NSTEMI) myocardial infarction [facility])   ? Occasional tremors   ? PAD (peripheral artery disease) ([place])   ? Rectal bleeding   ? Shingles   ? patient unaware but daughter confirms. it was a long time ago  ? Stroke [facility]) 01/2017  ? may have had a slight stroke  ? TIA (transient ischemic attack) 01/2017  ? UTI (urinary tract infection)   ? Vascular disease, peripheral ([place])   ? ?Past Surgical History:  ?Procedure Laterality Date  ? CARDIAC CATHETERIZATION  1998  ? 40% LM, 95% Ramus interm  ? CATARACT EXTRACTION, BILATERAL    ? COLONOSCOPY WITH PROPOFOL N/A 08/12/2018  ? Procedure: COLONOSCOPY WITH PROPOFOL;  Surgeon: Lucilla Lame, MD;  Location: [facility] ENDOSCOPY;  Service: Endoscopy;  Laterality: N/A;  ?  ENDARTERECTOMY FEMORAL Left 10/12/2018  ? Procedure: ENDARTERECTOMY FEMORAL;  Surgeon: Katha Cabal, MD;  Location: [facility] ORS;  Service: Vascular;  Laterality: Left;  angioplasty and left SFA stent placement  ? EYE SURGERY Bilateral   ? cataract extractions  ? LOWER EXTREMITY ANGIOGRAPHY Left 08/23/2017  ? Procedure: Lower Extremity Angiography;  Surgeon: Algernon Huxley, MD;  Location: [place] CV LAB;  Service: Cardiovascular;  Laterality: Left;  ? LOWER EXTREMITY ANGIOGRAPHY Left 07/26/2018  ? Procedure: LOWER EXTREMITY ANGIOGRAPHY;  Surgeon: Katha Cabal, MD;  Location: [place] CV LAB;  Service: Cardiovascular;  Laterality: Left;  ? LOWER EXTREMITY ANGIOGRAPHY Left 09/16/2018  ? Procedure: LOWER EXTREMITY ANGIOGRAPHY;  Surgeon: Katha Cabal, MD;  Location: [place] CV LAB;  Service: Cardiovascular;  Laterality:  Left;  ? PTCA  08/2013  ? Left common iliac  ? PTCA  12/2012  ? left ext iliac  ? RIGHT HEART CATH N/A 02/23/2020  ? Procedure: RIGHT HEART CATH;  Surgeon: Minna Merritts, MD;  Location: [place] CV LAB;  Service: Cardiovascular;  Laterality: N/A;  ? TUBAL LIGATION    ? ?Family History  ?Problem Relation Age of Onset  ? Dementia Mother   ? Diabetes Father   ? ?Social History  ? ?Socioeconomic History  ? Marital status: Married  ?  Spouse name: kermit  ? Number of children: 3  ? Years of education: Not on file  ? Highest education level: 12th grade  ?Occupational History  ? Occupation: Retired  ?  Comment: homemaker  ?Tobacco Use  ? Smoking status: Former  ?  Packs/day: 2.00  ?  Years: 37.00  ?  Pack years: 74.00  ?  Types: Cigarettes  ?  Quit date: 43  ?  Years since quitting: 43.4  ? Smokeless tobacco: Never  ? Tobacco comments:  ?  smoking cessation materials not required  ?Vaping Use  ? Vaping Use: Never used  ?Substance and Sexual Activity  ? Alcohol use: No  ?  Alcohol/week: 0.0 standard drinks  ? Drug use: No  ? Sexual activity: Not Currently  ?Other Topics  Concern  ? Not on file  ?Social History Narrative  ? Husband has dementia and may wander. Requires constant supervision. Pt's daughter also lives with them. Her son Ollen Gross is a neurologist.   ? ?Social Determin

## 2022-03-16 DIAGNOSIS — E119 Type 2 diabetes mellitus without complications: Secondary | ICD-10-CM | POA: Diagnosis not present

## 2022-03-16 DIAGNOSIS — Z794 Long term (current) use of insulin: Secondary | ICD-10-CM | POA: Diagnosis not present

## 2022-03-17 DIAGNOSIS — H353211 Exudative age-related macular degeneration, right eye, with active choroidal neovascularization: Secondary | ICD-10-CM | POA: Diagnosis not present

## 2022-03-24 ENCOUNTER — Ambulatory Visit (INDEPENDENT_AMBULATORY_CARE_PROVIDER_SITE_OTHER): Payer: Medicare HMO | Admitting: Internal Medicine

## 2022-03-24 ENCOUNTER — Encounter: Payer: Self-pay | Admitting: Internal Medicine

## 2022-03-24 VITALS — BP 110/50 | HR 60 | Ht 61.0 in | Wt 107.0 lb

## 2022-03-24 DIAGNOSIS — N183 Chronic kidney disease, stage 3 unspecified: Secondary | ICD-10-CM

## 2022-03-24 DIAGNOSIS — E118 Type 2 diabetes mellitus with unspecified complications: Secondary | ICD-10-CM

## 2022-03-24 DIAGNOSIS — I480 Paroxysmal atrial fibrillation: Secondary | ICD-10-CM

## 2022-03-24 DIAGNOSIS — M791 Myalgia, unspecified site: Secondary | ICD-10-CM | POA: Diagnosis not present

## 2022-03-24 DIAGNOSIS — Z1231 Encounter for screening mammogram for malignant neoplasm of breast: Secondary | ICD-10-CM | POA: Diagnosis not present

## 2022-03-24 DIAGNOSIS — J449 Chronic obstructive pulmonary disease, unspecified: Secondary | ICD-10-CM

## 2022-03-24 DIAGNOSIS — E1169 Type 2 diabetes mellitus with other specified complication: Secondary | ICD-10-CM | POA: Diagnosis not present

## 2022-03-24 DIAGNOSIS — E785 Hyperlipidemia, unspecified: Secondary | ICD-10-CM

## 2022-03-24 DIAGNOSIS — T466X5A Adverse effect of antihyperlipidemic and antiarteriosclerotic drugs, initial encounter: Secondary | ICD-10-CM

## 2022-03-24 DIAGNOSIS — M5417 Radiculopathy, lumbosacral region: Secondary | ICD-10-CM

## 2022-03-24 DIAGNOSIS — R7989 Other specified abnormal findings of blood chemistry: Secondary | ICD-10-CM | POA: Diagnosis not present

## 2022-03-24 DIAGNOSIS — I10 Essential (primary) hypertension: Secondary | ICD-10-CM | POA: Diagnosis not present

## 2022-03-24 DIAGNOSIS — E1122 Type 2 diabetes mellitus with diabetic chronic kidney disease: Secondary | ICD-10-CM

## 2022-03-24 DIAGNOSIS — F39 Unspecified mood [affective] disorder: Secondary | ICD-10-CM | POA: Diagnosis not present

## 2022-03-24 DIAGNOSIS — Z0000 Encounter for general adult medical examination without abnormal findings: Secondary | ICD-10-CM

## 2022-03-24 MED ORDER — PROMETHAZINE-DM 6.25-15 MG/5ML PO SYRP: ORAL_SOLUTION | ORAL | 0 refills | Status: DC

## 2022-03-24 MED ORDER — INSULIN GLARGINE 100 UNIT/ML ~~LOC~~ SOLN: 5.0000 [IU] | Freq: Every day | SUBCUTANEOUS | 5 refills | Status: DC

## 2022-03-24 MED ORDER — GABAPENTIN 100 MG PO CAPS: 100.0000 mg | ORAL_CAPSULE | Freq: Every day | ORAL | 1 refills | Status: DC

## 2022-03-24 MED ORDER — HUMALOG KWIKPEN 100 UNIT/ML ~~LOC~~ SOPN: 3.0000 [IU] | PEN_INJECTOR | Freq: Three times a day (TID) | SUBCUTANEOUS | 0 refills | Status: DC

## 2022-03-24 MED ORDER — BD PEN NEEDLE SHORT U/F 31G X 8 MM MISC: 3 refills | Status: DC

## 2022-03-24 MED ORDER — APIXABAN 2.5 MG PO TABS: ORAL_TABLET | ORAL | 5 refills | Status: DC

## 2022-03-24 MED ORDER — SERTRALINE HCL 50 MG PO TABS: ORAL_TABLET | ORAL | 1 refills | Status: DC

## 2022-03-24 NOTE — Progress Notes (Signed)
Date:  03/24/2022   Name:  Jill Hudson   DOB:  06-Feb-1935   MRN:  [id number]   Chief Complaint: No chief complaint on file. Jill Hudson is a 87 y.o. female who presents today for her Complete Annual Exam. She feels fairly well. She reports walking. She reports she is sleeping well. Breast complaints - none.  Mammogram: 03/2021 DEXA: 06/2018 osteopenia Pap smear: discontinued Colonoscopy: 07/2018  Health Maintenance Due  Topic Date Due   Zoster Vaccines- Shingrix (1 of 2) Never done   COVID-19 Vaccine (4 - Booster for Pfizer series) 12/17/2020   OPHTHALMOLOGY EXAM  09/23/2021   FOOT EXAM  03/20/2022    Immunization History  Administered Date(s) Administered   Fluad Quad(high Dose 65+) 09/14/2019, 08/07/2020, 09/04/2021   Influenza, High Dose Seasonal PF 08/17/2017   Influenza, Seasonal, Injecte, Preservative Fre 09/04/2005, 08/10/2007, 08/27/2010   Influenza,inj,Quad PF,6+ Mos 08/08/2014, 09/16/2015, 09/23/2016, 07/22/2018   Influenza,inj,quad, With Preservative 08/16/2013, 09/14/2019   PFIZER Comirnaty(Gray Top)Covid-19 Tri-Sucrose Vaccine 12/01/2019, 12/22/2019, 10/22/2020   Pneumococcal Conjugate-13 09/10/2014   Pneumococcal Polysaccharide-23 10/29/2003   Pneumococcal-Unspecified 09/04/2010   Tdap 10/26/2008, 05/02/2021   Varicella 05/07/2011   Zoster, Live 05/07/2011    Hypertension This is a chronic problem. The problem is controlled. Pertinent negatives include no chest pain, headaches, palpitations or shortness of breath. Past treatments include calcium channel blockers, direct vasodilators, angiotensin blockers and diuretics. Hypertensive end-organ damage includes kidney disease, CAD/MI, PVD and retinopathy.  Diabetes She presents for her follow-up diabetic visit. She has type 2 diabetes mellitus. Her disease course has been stable. Pertinent negatives for hypoglycemia include no dizziness, headaches, nervousness/anxiousness or tremors. Pertinent negatives for  diabetes include no chest pain, no fatigue, no polydipsia and no polyuria. Diabetic complications include PVD and retinopathy. Current diabetic treatment includes insulin injections (jardiance).  Depression        This is a chronic problem.The problem is unchanged.  Associated symptoms include myalgias.  Associated symptoms include no fatigue and no headaches.  Past treatments include SSRIs - Selective serotonin reuptake inhibitors.  Compliance with treatment is good.  Lab Results  Component Value Date   NA 133 (A) 08/19/2021   K 4.9 08/19/2021   CO2 26 (A) 08/19/2021   GLUCOSE 299 (H) 12/25/2020   BUN 33 (A) 08/19/2021   CREATININE 1.5 (A) 08/19/2021   CALCIUM 9.5 12/25/2020   EGFR 30 (L) 12/25/2020   GFRNONAA 35 08/19/2021   Lab Results  Component Value Date   CHOL 80 08/19/2021   HDL 56 08/19/2021   LDLCALC 1 08/19/2021   TRIG 115 08/19/2021   CHOLHDL 2.1 02/19/2020   Lab Results  Component Value Date   TSH 3.720 12/25/2020   Lab Results  Component Value Date   HGBA1C 7.4 10/31/2021   Lab Results  Component Value Date   WBC 8.3 12/25/2020   HGB 10.7 (L) 12/25/2020   HCT 33.3 (L) 12/25/2020   MCV 92 12/25/2020   PLT 251 12/25/2020   Lab Results  Component Value Date   ALT 25 12/25/2020   AST 23 12/25/2020   ALKPHOS 107 12/25/2020   BILITOT <0.2 12/25/2020   No results found for: 25OHVITD2, 25OHVITD3, VD25OH   Review of Systems  Constitutional:  Negative for chills, fatigue and fever.  HENT:  Negative for congestion, hearing loss, tinnitus, trouble swallowing and voice change.   Eyes:  Positive for visual disturbance.  Respiratory:  Negative for cough, chest tightness, shortness of breath and wheezing.  Cardiovascular:  Negative for chest pain, palpitations and leg swelling.  Gastrointestinal:  Negative for abdominal pain, constipation, diarrhea and vomiting.  Endocrine: Negative for polydipsia and polyuria.  Genitourinary:  Negative for dysuria, frequency,  genital sores, vaginal bleeding and vaginal discharge.  Musculoskeletal:  Positive for arthralgias, gait problem and myalgias. Negative for joint swelling.  Skin:  Negative for color change and rash.  Neurological:  Negative for dizziness, tremors, light-headedness and headaches.  Hematological:  Negative for adenopathy. Does not bruise/bleed easily.  Psychiatric/Behavioral:  Positive for depression. Negative for dysphoric mood and sleep disturbance. The patient is not nervous/anxious.    Patient Active Problem List   Diagnosis Date Noted   Acquired thrombophilia ([place]) 03/20/2021   Mood disorder ([place]) 05/02/2020   Chronic heart failure with preserved ejection fraction ([place]) 04/12/2020   Diverticulosis of large intestine without perforation or abscess with bleeding 03/27/2020   AF (paroxysmal atrial fibrillation) ([place])    Thrush    COPD (chronic obstructive pulmonary disease) with chronic bronchitis ([place])    CKD (chronic kidney disease), stage IIIa 02/18/2020   Malnutrition of mild degree ([place]) 01/08/2020   Age-related macular degeneration, dry, left eye 06/21/2019   Age-related macular degeneration, wet, right eye ([place]) 06/21/2019   Moderate nonproliferative diabetic retinopathy associated with type 2 diabetes mellitus ([place]) 06/21/2019   Lumbosacral radiculopathy at L4 05/01/2019   Underweight 05/01/2019   Encounter for long-term (current) use of aspirin 10/31/2018   Encounter for long-term (current) use of antiplatelets/antithrombotics 10/31/2018   Long term current use of oral hypoglycemic drug 10/31/2018   Encounter for long-term (current) use of insulin ([place]) 10/31/2018   GIB (gastrointestinal bleeding) 08/11/2018   Leg pain 07/03/2017   Carpal tunnel syndrome on both sides 05/05/2017   Myalgia due to HMG CoA reductase inhibitor 05/05/2017   History of CVA (cerebrovascular accident) 02/15/2017   Degenerative disc disease, lumbar 12/21/2016   Atherosclerosis of native arteries of  extremity with intermittent claudication ([place]) 12/20/2016   Bilateral carotid artery stenosis 12/20/2016   Occlusion and stenosis of bilateral carotid arteries 12/20/2016   Hip bursitis 05/18/2016   Elevated TSH 01/18/2016   CKD stage 3 due to type 2 diabetes mellitus ([place]) 01/16/2016   Senile ecchymosis 01/16/2016   Type II diabetes mellitus with complication ([place]) 94/80/1655   GERD (gastroesophageal reflux disease) 07/24/2015   PVD (peripheral vascular disease) ([place]) 05/17/2015   Neoplasm of uncertain behavior of skin 05/17/2015   Retinopathy, diabetic, proliferative ([place]) 04/11/2015   Hyperlipidemia associated with type 2 diabetes mellitus ([place]) 04/11/2015   Essential hypertension 04/11/2015   Generalized OA 04/11/2015   Proliferative diabetic retinopathy(362.02) 04/11/2015   Arteriosclerosis of coronary artery 05/29/2013   Hypertensive heart disease without CHF 05/29/2013    Allergies  Allergen Reactions   Saxagliptin Diarrhea   Epinephrine Other (See Comments)    Patient does not remember what happens when she uses this   Atorvastatin Other (See Comments)    Muscle aches   Codeine Other (See Comments)    Upset stomach   Ezetimibe Other (See Comments)    Myalgias(ZETIA)   Limonene Rash    Patient does not recall this reaction   Nitrofurantoin Rash and Other (See Comments)    Pruitus   Sulfa Antibiotics Rash and Other (See Comments)    Sore mouth     Past Surgical History:  Procedure Laterality Date   CARDIAC CATHETERIZATION  1998   40% LM, 95% Ramus interm   CATARACT EXTRACTION, BILATERAL  COLONOSCOPY WITH PROPOFOL N/A 08/12/2018   Procedure: COLONOSCOPY WITH PROPOFOL;  Surgeon: Lucilla Lame, MD;  Location[facility] ENDOSCOPY;  Service: Endoscopy;  Laterality: N/A;   ENDARTERECTOMY FEMORAL Left 10/12/2018   Procedure: ENDARTERECTOMY FEMORAL;  Surgeon: Katha Cabal, MD;  Location: [facility] ORS;  Service: Vascular;  Laterality: Left;  angioplasty and left SFA stent  placement   EYE SURGERY Bilateral    cataract extractions   LOWER EXTREMITY ANGIOGRAPHY Left 08/23/2017   Procedure: Lower Extremity Angiography;  Surgeon: Algernon Huxley, MD;  Location: [place] CV LAB;  Service: Cardiovascular;  Laterality: Left;   LOWER EXTREMITY ANGIOGRAPHY Left 07/26/2018   Procedure: LOWER EXTREMITY ANGIOGRAPHY;  Surgeon: Katha Cabal, MD;  Location: [place] CV LAB;  Service: Cardiovascular;  Laterality: Left;   LOWER EXTREMITY ANGIOGRAPHY Left 09/16/2018   Procedure: LOWER EXTREMITY ANGIOGRAPHY;  Surgeon: Katha Cabal, MD;  Location: [place] CV LAB;  Service: Cardiovascular;  Laterality: Left;   PTCA  08/2013   Left common iliac   PTCA  12/2012   left ext iliac   RIGHT HEART CATH N/A 02/23/2020   Procedure: RIGHT HEART CATH;  Surgeon: Minna Merritts, MD;  Location: [place] CV LAB;  Service: Cardiovascular;  Laterality: N/A;   TUBAL LIGATION      Social History   Tobacco Use   Smoking status: Former    Packs/day: 2.00    Years: 37.00    Pack years: 74.00    Types: Cigarettes    Quit date: 1980    Years since quitting: 43.4   Smokeless tobacco: Never   Tobacco comments:    smoking cessation materials not required  Vaping Use   Vaping Use: Never used  Substance Use Topics   Alcohol use: No    Alcohol/week: 0.0 standard drinks   Drug use: No     Medication list has been reviewed and updated.  Current Meds  Medication Sig   acetaminophen (TYLENOL) 650 MG CR tablet Take 1,300 mg by mouth every 8 (eight) hours as needed for pain.   amLODipine (NORVASC) 5 MG tablet Take 5 mg by mouth in the morning and at bedtime.   B-D ULTRAFINE III SHORT PEN 31G X 8 MM MISC USE AS DIRECTED   cloNIDine (CATAPRES) 0.1 MG tablet Take 0.1 mg by mouth 2 (two) times daily as needed.   Cyanocobalamin 1000 MCG TBCR Take 1,000 mcg by mouth daily.   ELIQUIS 2.5 MG TABS tablet TAKE 1 TABLET(2.5 MG) BY MOUTH TWICE DAILY   feeding supplement,  GLUCERNA SHAKE, (GLUCERNA SHAKE) LIQD Take 237 mLs by mouth 3 (three) times daily between meals.   ferrous sulfate 325 (65 FE) MG tablet Take 325 mg by mouth daily with breakfast.   HUMALOG KWIKPEN 100 UNIT/ML KwikPen Inject 3 Units into the skin 3 (three) times daily.   insulin glargine (LANTUS) 100 UNIT/ML injection Inject 5 Units into the skin daily.   JARDIANCE 10 MG TABS tablet Take 10 mg by mouth daily.   losartan (COZAAR) 25 MG tablet Take 25 mg by mouth 2 (two) times daily. Unsure of dosage   melatonin 5 MG TABS Take 0.5 tablets (2.5 mg total) by mouth at bedtime as needed (sleep).   mirtazapine (REMERON) 15 MG tablet Take 1 tablet (15 mg total) by mouth at bedtime.   Multiple Vitamins-Minerals (PRESERVISION AREDS PO) Take 1 capsule by mouth 2 (two) times daily.    promethazine-dextromethorphan (PROMETHAZINE-DM) 6.25-15 MG/5ML syrup TAKE 5 ML BY MOUTH FOUR  TIMES DAILY AS NEEDED FOR COUGH   sertraline (ZOLOFT) 50 MG tablet TAKE 1 TABLET(50 MG) BY MOUTH DAILY   torsemide (DEMADEX) 20 MG tablet Take 2 tablets (40 mg total) by mouth daily. (Patient taking differently: Take 40 mg by mouth 2 (two) times daily. 1/2 tablet morning and 1/2 tablet at night.)       03/24/2022   10:17 AM 02/23/2022    3:13 PM 11/06/2021   10:50 AM 09/04/2021   10:23 AM  GAD 7 : Generalized Anxiety Score  Nervous, Anxious, on Edge 0 '1 2 3  ' Control/stop worrying 0 '1 3 2  ' Worry too much - different things 0 '1 3 1  ' Trouble relaxing 0 1 0 3  Restless 0 1 0 2  Easily annoyed or irritable '1 1 3 3  ' Afraid - awful might happen 0 0 3 0  Total GAD 7 Score '1 6 14 14  ' Anxiety Difficulty Not difficult at all Somewhat difficult Somewhat difficult Very difficult       03/24/2022   10:17 AM  Depression screen PHQ 2/9  Decreased Interest 2  Down, Depressed, Hopeless 2  PHQ - 2 Score 4  Altered sleeping 2  Tired, decreased energy 3  Change in appetite 0  Feeling bad or failure about yourself  0  Trouble concentrating  0  Moving slowly or fidgety/restless 0  Suicidal thoughts 0  PHQ-9 Score 9  Difficult doing work/chores Not difficult at all    BP Readings from Last 3 Encounters:  03/24/22 (!) 110/50  02/23/22 (!) 122/44  12/01/21 125/63    Physical Exam Vitals and nursing note reviewed.  Constitutional:      General: She is not in acute distress.    Appearance: She is well-developed.  HENT:     Head: Normocephalic and atraumatic.     Right Ear: Tympanic membrane and ear canal normal.     Left Ear: Tympanic membrane and ear canal normal.     Nose:     Right Sinus: No maxillary sinus tenderness.     Left Sinus: No maxillary sinus tenderness.  Eyes:     General: No scleral icterus.       Right eye: No discharge.        Left eye: No discharge.     Conjunctiva/sclera: Conjunctivae normal.  Neck:     Thyroid: No thyromegaly.     Vascular: No carotid bruit.  Cardiovascular:     Rate and Rhythm: Normal rate and regular rhythm.     Pulses:          Radial pulses are 2+ on the right side and 2+ on the left side.       Dorsalis pedis pulses are 1+ on the right side and 1+ on the left side.       Posterior tibial pulses are 2+ on the right side and 1+ on the left side.     Heart sounds: Normal heart sounds.  Pulmonary:     Effort: Pulmonary effort is normal. No respiratory distress.     Breath sounds: No decreased breath sounds or wheezing.  Abdominal:     General: Bowel sounds are normal.     Palpations: Abdomen is soft.     Tenderness: There is no abdominal tenderness.  Musculoskeletal:     Cervical back: Normal range of motion. No erythema.     Right lower leg: No edema.     Left lower leg: No edema.  Lymphadenopathy:  Cervical: No cervical adenopathy.  Skin:    General: Skin is warm and dry.     Findings: No rash.  Neurological:     Mental Status: She is alert and oriented to person, place, and time.     Cranial Nerves: No cranial nerve deficit.     Sensory: No sensory  deficit.     Deep Tendon Reflexes: Reflexes are normal and symmetric.  Psychiatric:        Attention and Perception: Attention normal.        Mood and Affect: Mood normal.    Wt Readings from Last 3 Encounters:  03/24/22 107 lb (48.5 kg)  02/23/22 107 lb (48.5 kg)  12/01/21 117 lb (53.1 kg)    BP (!) 110/50   Pulse 60   Ht '5\' 1"'  (1.549 m)   Wt 107 lb (48.5 kg)   SpO2 98%   BMI 20.22 kg/m   Assessment and Plan: 1. Annual physical exam Stable exam. Up to date on screenings and immunizations.  2. Encounter for screening mammogram for breast cancer Due next month - patient will schedule - MM 3D SCREEN BREAST BILATERAL  3. Essential hypertension Clinically stable exam with well controlled BP. Tolerating medications without side effects at this time. Pt to continue current regimen and low sodium diet; benefits of regular exercise as able discussed. - CBC with Differential/Platelet - POCT urinalysis dipstick  4. Type II diabetes mellitus with complication (HCC) Followed by Endo - doing better with Lantus 5 units daily but may have to change insulin formulation. Sample of [place] [facility] given to cover until Rx approved - Comprehensive metabolic panel - Hemoglobin A1c - Insulin Pen Needle (B-D ULTRAFINE III SHORT PEN) 31G X 8 MM MISC; USE AS DIRECTED  Dispense: 100 each; Refill: 3  5. Hyperlipidemia associated with type 2 diabetes mellitus ([place]) Not on statin due to intolerance - Lipid panel  6. CKD stage 3 due to type 2 diabetes mellitus ([place]) Has been stable with GFR ~ 30 Nephrology is following - Comprehensive metabolic panel  7. Myalgia due to HMG CoA reductase inhibitor  8. Elevated TSH - TSH+T4F+T3Free  9. Mood disorder (HCC) Clinically stable on current regimen with good control of symptoms, No SI or HI. Will continue current therapy with Sertraline and Mirtazapine Weight has been stable for the past month. - sertraline (ZOLOFT) 50 MG tablet; TAKE 1 TABLET(50 MG)  BY MOUTH DAILY  Dispense: 30 tablet; Refill: 1  10. AF (paroxysmal atrial fibrillation) (HCC) In SR today - apixaban (ELIQUIS) 2.5 MG TABS tablet; TAKE 1 TABLET(2.5 MG) BY MOUTH TWICE DAILY  Dispense: 60 tablet; Refill: 5  11. COPD (chronic obstructive pulmonary disease) with chronic bronchitis (HCC) - promethazine-dextromethorphan (PROMETHAZINE-DM) 6.25-15 MG/5ML syrup; TAKE 5 ML BY MOUTH FOUR TIMES DAILY AS NEEDED FOR COUGH  Dispense: 180 mL; Refill: 0  12. Lumbosacral radiculopathy at L4 - gabapentin (NEURONTIN) 100 MG capsule; Take 1 capsule (100 mg total) by mouth at bedtime.  Dispense: 90 capsule; Refill: 1   Partially dictated using Editor, commissioning. Any errors are unintentional.  [name], MD [facility]  03/24/2022

## 2022-03-25 LAB — COMPREHENSIVE METABOLIC PANEL
ALT: 19 IU/L (ref 0–32)
AST: 21 IU/L (ref 0–40)
Albumin/Globulin Ratio: 2 (ref 1.2–2.2)
Albumin: 4.3 g/dL (ref 3.6–4.6)
Alkaline Phosphatase: 96 IU/L (ref 44–121)
BUN/Creatinine Ratio: 24 (ref 12–28)
BUN: 33 mg/dL — ABNORMAL HIGH (ref 8–27)
Bilirubin Total: 0.3 mg/dL (ref 0.0–1.2)
CO2: 22 mmol/L (ref 20–29)
Calcium: 9.6 mg/dL (ref 8.7–10.3)
Chloride: 98 mmol/L (ref 96–106)
Creatinine, Ser: 1.35 mg/dL — ABNORMAL HIGH (ref 0.57–1.00)
Globulin, Total: 2.1 g/dL (ref 1.5–4.5)
Glucose: 154 mg/dL — ABNORMAL HIGH (ref 70–99)
Potassium: 4.2 mmol/L (ref 3.5–5.2)
Sodium: 137 mmol/L (ref 134–144)
Total Protein: 6.4 g/dL (ref 6.0–8.5)
eGFR: 39 mL/min/{1.73_m2} — ABNORMAL LOW (ref >59)

## 2022-03-25 LAB — CBC WITH DIFFERENTIAL/PLATELET
Basophils Absolute: 0.1 10*3/uL (ref 0.0–0.2)
Basos: 1 %
EOS (ABSOLUTE): 0.3 10*3/uL (ref 0.0–0.4)
Eos: 4 %
Hematocrit: 33 % — ABNORMAL LOW (ref 34.0–46.6)
Hemoglobin: 11.2 g/dL (ref 11.1–15.9)
Immature Grans (Abs): 0 10*3/uL (ref 0.0–0.1)
Immature Granulocytes: 0 %
Lymphocytes Absolute: 1.7 10*3/uL (ref 0.7–3.1)
Lymphs: 22 %
MCH: 31 pg (ref 26.6–33.0)
MCHC: 33.9 g/dL (ref 31.5–35.7)
MCV: 91 fL (ref 79–97)
Monocytes Absolute: 0.9 10*3/uL (ref 0.1–0.9)
Monocytes: 11 %
Neutrophils Absolute: 4.8 10*3/uL (ref 1.4–7.0)
Neutrophils: 62 %
Platelets: 273 10*3/uL (ref 150–450)
RBC: 3.61 x10E6/uL — ABNORMAL LOW (ref 3.77–5.28)
RDW: 11.7 % (ref 11.7–15.4)
WBC: 7.9 10*3/uL (ref 3.4–10.8)

## 2022-03-25 LAB — TSH+T4F+T3FREE
Free T4: 1.06 ng/dL (ref 0.82–1.77)
T3, Free: 3.5 pg/mL (ref 2.0–4.4)
TSH: 5.49 u[IU]/mL — ABNORMAL HIGH (ref 0.450–4.500)

## 2022-03-25 LAB — LIPID PANEL
Chol/HDL Ratio: 3.7 ratio (ref 0.0–4.4)
Cholesterol, Total: 179 mg/dL (ref 100–199)
HDL: 48 mg/dL (ref >39)
LDL Chol Calc (NIH): 98 mg/dL (ref 0–99)
Triglycerides: 191 mg/dL — ABNORMAL HIGH (ref 0–149)
VLDL Cholesterol Cal: 33 mg/dL (ref 5–40)

## 2022-03-25 LAB — HEMOGLOBIN A1C
Est. average glucose Bld gHb Est-mCnc: 166 mg/dL
Hgb A1c MFr Bld: 7.4 % — ABNORMAL HIGH (ref 4.8–5.6)

## 2022-03-31 ENCOUNTER — Other Ambulatory Visit: Payer: Self-pay | Admitting: Internal Medicine

## 2022-03-31 DIAGNOSIS — E118 Type 2 diabetes mellitus with unspecified complications: Secondary | ICD-10-CM

## 2022-03-31 MED ORDER — NOVOLOG FLEXPEN 100 UNIT/ML ~~LOC~~ SOPN: 3.0000 [IU] | PEN_INJECTOR | Freq: Three times a day (TID) | SUBCUTANEOUS | 1 refills | Status: DC

## 2022-04-03 DIAGNOSIS — E1169 Type 2 diabetes mellitus with other specified complication: Secondary | ICD-10-CM | POA: Diagnosis not present

## 2022-04-03 DIAGNOSIS — I152 Hypertension secondary to endocrine disorders: Secondary | ICD-10-CM | POA: Diagnosis not present

## 2022-04-03 DIAGNOSIS — E1165 Type 2 diabetes mellitus with hyperglycemia: Secondary | ICD-10-CM | POA: Diagnosis not present

## 2022-04-03 DIAGNOSIS — Z794 Long term (current) use of insulin: Secondary | ICD-10-CM | POA: Diagnosis not present

## 2022-04-03 DIAGNOSIS — E785 Hyperlipidemia, unspecified: Secondary | ICD-10-CM | POA: Diagnosis not present

## 2022-04-03 DIAGNOSIS — E1159 Type 2 diabetes mellitus with other circulatory complications: Secondary | ICD-10-CM | POA: Diagnosis not present

## 2022-04-03 DIAGNOSIS — E1142 Type 2 diabetes mellitus with diabetic polyneuropathy: Secondary | ICD-10-CM | POA: Diagnosis not present

## 2022-04-13 DIAGNOSIS — L853 Xerosis cutis: Secondary | ICD-10-CM | POA: Diagnosis not present

## 2022-04-13 DIAGNOSIS — L905 Scar conditions and fibrosis of skin: Secondary | ICD-10-CM | POA: Diagnosis not present

## 2022-04-13 DIAGNOSIS — L578 Other skin changes due to chronic exposure to nonionizing radiation: Secondary | ICD-10-CM | POA: Diagnosis not present

## 2022-04-13 DIAGNOSIS — Z859 Personal history of malignant neoplasm, unspecified: Secondary | ICD-10-CM | POA: Diagnosis not present

## 2022-04-13 DIAGNOSIS — L821 Other seborrheic keratosis: Secondary | ICD-10-CM | POA: Diagnosis not present

## 2022-04-15 DIAGNOSIS — Z794 Long term (current) use of insulin: Secondary | ICD-10-CM | POA: Diagnosis not present

## 2022-04-15 DIAGNOSIS — E119 Type 2 diabetes mellitus without complications: Secondary | ICD-10-CM | POA: Diagnosis not present

## 2022-04-21 DIAGNOSIS — H353211 Exudative age-related macular degeneration, right eye, with active choroidal neovascularization: Secondary | ICD-10-CM | POA: Diagnosis not present

## 2022-05-05 ENCOUNTER — Telehealth: Payer: Self-pay

## 2022-05-05 DIAGNOSIS — I480 Paroxysmal atrial fibrillation: Secondary | ICD-10-CM | POA: Diagnosis not present

## 2022-05-05 DIAGNOSIS — Z5181 Encounter for therapeutic drug level monitoring: Secondary | ICD-10-CM | POA: Diagnosis not present

## 2022-05-05 DIAGNOSIS — E7800 Pure hypercholesterolemia, unspecified: Secondary | ICD-10-CM | POA: Diagnosis not present

## 2022-05-05 DIAGNOSIS — I2510 Atherosclerotic heart disease of native coronary artery without angina pectoris: Secondary | ICD-10-CM | POA: Diagnosis not present

## 2022-05-05 DIAGNOSIS — I5032 Chronic diastolic (congestive) heart failure: Secondary | ICD-10-CM | POA: Diagnosis not present

## 2022-05-05 DIAGNOSIS — I110 Hypertensive heart disease with heart failure: Secondary | ICD-10-CM | POA: Diagnosis not present

## 2022-05-05 DIAGNOSIS — Z789 Other specified health status: Secondary | ICD-10-CM | POA: Diagnosis not present

## 2022-05-05 NOTE — Telephone Encounter (Signed)
  Care Management   Follow Up Note   05/05/2022 Name: Jill Hudson MRN: [id number] DOB: Jan 27, 1935   Referred by: [name], MD Reason for referral : No chief complaint on file.   An unsuccessful telephone outreach was attempted today. The patient was referred to the case management team for assistance with care management and care coordination.   Follow Up Plan: The care management team will reach out to the patient again over the next 30 days.   Noreene Larsson RN, MSN, [place] [facility] Mobile: [phone]

## 2022-05-05 NOTE — Telephone Encounter (Signed)
  Care Management   Follow Up Note   05/05/2022 Name: Jill Hudson MRN: [id number] DOB: 11-24-1934   Referred by: [name], MD Reason for referral : Chronic Care Management (RNCM: Follow up for Chronic Disease Management and Care Coordination Needs )   An unsuccessful telephone outreach was attempted today. The patient was referred to the case management team for assistance with care management and care coordination.   Follow Up Plan: The care management team will reach out to the patient again over the next 30 days.   Noreene Larsson RN, MSN, [place] [facility] Mobile: [phone]

## 2022-05-05 NOTE — Telephone Encounter (Signed)
Error in charting. Disregard

## 2022-05-12 ENCOUNTER — Telehealth: Payer: Self-pay

## 2022-05-12 ENCOUNTER — Ambulatory Visit: Payer: Self-pay

## 2022-05-12 ENCOUNTER — Ambulatory Visit (INDEPENDENT_AMBULATORY_CARE_PROVIDER_SITE_OTHER): Payer: Medicare HMO

## 2022-05-12 DIAGNOSIS — E1169 Type 2 diabetes mellitus with other specified complication: Secondary | ICD-10-CM

## 2022-05-12 DIAGNOSIS — J449 Chronic obstructive pulmonary disease, unspecified: Secondary | ICD-10-CM

## 2022-05-12 DIAGNOSIS — I10 Essential (primary) hypertension: Secondary | ICD-10-CM

## 2022-05-12 DIAGNOSIS — M79604 Pain in right leg: Secondary | ICD-10-CM

## 2022-05-12 DIAGNOSIS — E118 Type 2 diabetes mellitus with unspecified complications: Secondary | ICD-10-CM

## 2022-05-12 DIAGNOSIS — I5032 Chronic diastolic (congestive) heart failure: Secondary | ICD-10-CM

## 2022-05-12 NOTE — Patient Instructions (Signed)
Visit Information  Thank you for taking time to visit with me today. Please don't hesitate to contact me if I can be of assistance to you before our next scheduled telephone appointment.  Following are the goals we discussed today:  Long-Range Goal: RNCM: Effective Management of Plan of Care for Chronic Disease Management (CHF, DM, HTN, HLD, COPD, falls, depression, Chronic pain) Completed 05/12/2022  Start Date: 10/15/2021  Expected End Date: 10/15/2022  Priority: High  Note:   Current Barriers: Resolving, duplicate goals. See new plan of care in care coordination  Knowledge Deficits related to plan of care for management of CHF, HTN, HLD, COPD, Chronic Pain, Depression: depressed mood anxiety, and falls prevention and safety  Chronic Disease Management support and education needs related to CHF, HTN, HLD, COPD, Chronic pain, DMII, Depression: depressed mood anxiety, and falls prevention and safety   RNCM Clinical Goal(s):  Patient will verbalize basic understanding of CHF, HTN, HLD, COPD, Chronic pain, DMII, Depression, and Falls prevention and safety  disease process and self health management plan as evidenced by following the plan of care, taking medications as directed, following dietary restrictions, and working with the pcp and CCM team to optimize health and well being take all medications exactly as prescribed and will call provider for medication related questions as evidenced by compliance with medications and calling for refills before running out of medications    demonstrate understanding of rationale for each prescribed medication as evidenced by compliance with medications regimen    attend all scheduled medical appointments: 03-24-2022 with pcp, specialist appointments coming up, as evidenced by keeping appointments and calling for schedule change needs         demonstrate improved and ongoing adherence to prescribed treatment plan for CHF, HTN, HLD, COPD, DMII, Chronic pain,  Depression, and falls prevention and safety  as evidenced by stable conditions and calling the office for questions or changes  demonstrate a decrease in CHF, HTN, HLD, COPD, DMII, Depression, Chronic pain, and falls  exacerbations  as evidenced by stable conditions, normal VS, normal A1C, and regular outreaches to help with effective management of health and well being  demonstrate ongoing self health care management ability for effective management of chronic conditions  as evidenced by  working with the CCM team  through collaboration with Consulting civil engineer, provider, and care team.    Interventions: 1:1 collaboration with primary care provider regarding development and update of comprehensive plan of care as evidenced by provider attestation and co-signature Inter-disciplinary care team collaboration (see longitudinal plan of care) Evaluation of current treatment plan related to  self management and patient's adherence to plan as established by provider     Heart Failure Interventions:  (Status: Goal Met.)  Long Term Goal 05-12-2022: Goals met and care plan is being closed  Basic overview and discussion of pathophysiology of Heart Failure reviewed. 02-06-2022: The patient is compliant with the plan of care for effective management of HF. The patient states that she got clearance for back surgery recently; however she has decided not to have it now. She is happy with that decision.  Provided education on low sodium diet. 02-06-2022: The patient is compliant with a heart healthy/ADA diet Reviewed Heart Failure Action Plan in depth and provided written copy Assessed need for readable accurate scales in home. 02-06-2022: Weighs safely at home and weight is stable. Denies any fluctuations Provided education about placing scale on hard, flat surface Advised patient to weigh each morning after emptying bladder. 02-06-2022:  The patient has a stable weight of 117. Review of safety when weighing due to high risk  for falls and injury Discussed importance of daily weight and advised patient to weigh and record daily Reviewed role of diuretics in prevention of fluid overload and management of heart failure Discussed the importance of keeping all appointments with provider. Has seen cardiology recently and has a follow up with her vascular surgeon later this month. Will continue to monitor for changes and new needs.  Provided patient with education about the role of exercise in the management of heart failure Screening for signs and symptoms of depression related to chronic disease state  Assessed social determinant of health barriers   COPD: (Status: Goal Met.) Long Term Goal  Reviewed medications with patient, including use of prescribed maintenance and rescue inhalers, and provided instruction on medication management and the importance of adherence. 4-14--2023: The patient is compliant with medications. Denies any new concerns with COPD medication management. Denies any acute changes in COPD and effective management of COPD.  Provided patient with basic written and verbal COPD education on self care/management/and exacerbation prevention Advised patient to track and manage COPD triggers. 02-06-2022: The patient knows factors that trigger her COPD. She is stable with her COPD at this time. Will continue to monitor.  Provided written and verbal instructions on pursed lip breathing and utilized returned demonstration as teach back Provided instruction about proper use of medications used for management of COPD including inhalers Advised patient to self assesses COPD action plan zone and make appointment with provider if in the yellow zone for 48 hours without improvement Advised patient to engage in light exercise as tolerated 3-5 days a week to aid in the the management of COPD Provided education about and advised patient to utilize infection prevention strategies to reduce risk of respiratory infection.  02-06-2022: The patient denies any issues with increase risk of infection at this time. Review of staying safe and monitoring for high risk areas that could cause exacerbation of COPD.  Discussed the importance of adequate rest and management of fatigue with COPD   Diabetes:  (Status: Goal Met.) Long Term Goal         Lab Results  Component Value Date    HGBA1C 7.5 06/16/2021  Recent A1C at endocrinology office was 7.4 on 10-31-2021 Assessed patient's understanding of A1c goal: <7% Provided education to patient about basic DM disease process; Reviewed medications with patient and discussed importance of medication adherence. 02-06-2022: The patient is compliant with medications;        Reviewed prescribed diet with patient heart healthy/ADA. 02-06-2022: The patient is compliant with heart healthy/ADA diet; Counseled on importance of regular laboratory monitoring as prescribed. 02-06-2022: The patient has regular lab work. Last at endocrinology with A1C of 7.4        Discussed plans with patient for ongoing care management follow up and provided patient with direct contact information for care management team;      Provided patient with written educational materials related to hypo and hyperglycemia and importance of correct treatment. 02-06-2022: The patient denies any lows or highs at this time. Is checking her blood sugars 4 times a day.        Reviewed scheduled/upcoming provider appointments including: 03-24-2022 with pcp, saw endocrinologist on 10-31-2021;         Advised patient, providing education and rationale, to check cbg before meals and at bedtime, when you have symptoms of low or high blood sugar, before and  after exercise, and the patient has a continuous glucose reader   and record. 10-15-2021: The patient states that she has been having to keep it on her right arm and it is a little sore because she had a fall and had a scabbed area on her left arm. Education on rotating sites and that  likely it is sore due to keeping it on the same arm. Education and support given. States her readings have been 108 to 160. Denies any real lows or real highs. Knows how to effectively manage hypoglycemic or hyperglycemic events. 12-04-2021: The patient is waiting on her [employer] supplies. She states it has been 2 weeks since she ordered them . She has been manually checking her blood sugars. States this am was 199. She denies any acute findings related to her DM and blood sugars. Knows how to effectively manage. States her average is about 199. 02-06-2022: The patient is doing well with effective management of her DM. States she woke up the other night and was concerned about her blood sugars but it was 140. Denies any acute findings related to blood sugars or DM health and well being.    call provider for findings outside established parameters;       Review of patient status, including review of consultants reports, relevant laboratory and other test results, and medications completed;     Last eye exam in December on 10-10-2021. The patient was to have shots in her eyes on 11-11-2021 for macular degeneration but this had to be changed due to other things going on with the patient. The patient will go next week for injections in her eye (12-04-2021). 02-06-2022: The patient continues to see the eye doctor on a regular basis. Will have additional shots in her eye later this month. Will continue to monitor.     Falls:  (Status: Goal Met.) Long Term Goal  Provided written and verbal education re: potential causes of falls and Fall prevention strategies Reviewed medications and discussed potential side effects of medications such as dizziness and frequent urination. 02-06-2022: The patient states that her daughter Judson Roch helps her with keeping her medications straight. She states that she is compliant with medications and aware of side effects to monitor for.  Advised patient of importance of notifying provider  of falls. 02-06-2022: Review and education provided  Assessed for signs and symptoms of orthostatic hypotension. 02-06-2022: Denies any issues with orthostatic hypotension at this time. Review of being safe and monitoring for blood pressure changes.  Assessed for falls since last encounter. 12-21-20[address] [address] x 2 last week in her home. One was when she tripped over a rug and the second time was when she was stepping up in the living room area. She has a scabbed area to her left arm and states that her left leg gave out. Education on falls prevention and safety. 12-04-2021: The patient has fallen again and was in the ER for left hip pain. She does not know what caused her to fall. She had an MRI yesterday. She is taking Tylenol for pain relief.( See pain management in plan of care) She endorses being safe and monitoring her surroundings; but she is still experiencing falls. 02-06-2022: The patient denies any new falls at this time. She is being safe. She uses her rolling walker with a seat. She states that she is being careful. Has been working with PT and does the exercises on days that she doesn't work with PT. The patient is doing well  and has decided against having back surgery.  Assessed patients knowledge of fall risk prevention secondary to previously provided education. 12-04-2021: Review and education provided. Is working with in home physical therapy. They had come to work with the patient at the end of the call. The patient has only had one session but she is hopeful it will help her with strengthening and ambulation. 02-06-2022: The patient is working with PT and is doing well. She does the exercises on days they do not come. Education and support given.  Provided patient information for fall alert systems. 10-15-2021: The patient is switching to [facility] coverage in January. Education provided to have the patient or patients daughter to call and see if they have a life alert system through the  insurance coverage for the patient. The patient states this would give her peace of mind and be helpful if she was alone with just her and her husband at home. She will inquire about life alert system.  Assessed working status of life alert bracelet and patient adherence Advised patient to discuss call the office for recommendations when new falls occur with provider   Depression   (Status: Goal Met.) Long Term Goal  Evaluation of current treatment plan related to Depression, Mental Health Concerns  self-management and patient's adherence to plan as established by provider. 12-04-2021: The patient states she is doing well with her depression. She has been focused on her health and well being and getting herself stable with walking and her pain under control. She denies any acute distress. The patient states that she has a good support from her daughter Clarise Cruz and other family. She feels blessed and fortunate. Will continue to monitor for new concerns or issues related to mental health and wellbeing. 02-06-2022: The patient is happy and doing well today. She is pleased with the decision not to have surgery. She talked with her family about it and even though she got clearance from her heart specialist she has decided not to move forward with surgical intervention for her back pain. It is stable currently. She talked about how much her family means to her and is thankful for the support she has.  Discussed plans with patient for ongoing care management follow up and provided patient with direct contact information for care management team Advised patient to call the office for changes in mood, anxiety, or depression ; Provided education to patient re: being around positive people, doing things she enjoys, talking to friends and family on the phone and utilization of resources when feeling down and depressed; Reviewed scheduled/upcoming provider appointments including 03-24-2022 Discussed plans with patient for  ongoing care management follow up and provided patient with direct contact information for care management team; Advised patient to discuss new concerns about depression or questions related to mental health with provider;   Hyperlipidemia:  (Status: Goal Met.) Long Term Goal       Lab Results  Component Value Date    CHOL 80 08/19/2021    HDL 56 08/19/2021    [place] 1 08/19/2021    TRIG 115 08/19/2021    CHOLHDL 2.1 02/19/2020      Medication review performed; medication list updated in electronic medical record. 02-06-2022: The patient takes Repatha but has been discontinued off of Crestor. Denies any acute issues at this time with her medications.  Provider established cholesterol goals reviewed. 02-06-2022: The patient is at goal. ; Counseled on importance of regular laboratory monitoring as prescribed. 02-06-2022: Has regular lab work.  Last in October of 2022; Provided HLD educational materials; Reviewed role and benefits of statin for ASCVD risk reduction; Discussed strategies to manage statin-induced myalgias; Reviewed importance of limiting foods high in cholesterol. 02-06-2022: Compliance noted with heart  healthy/ADA diet; Reviewed exercise goals and target of 150 minutes per week;   Hypertension: (Status: Goal Met.) Last practice recorded BP readings:     BP Readings from Last 3 Encounters:  12/01/21 125/63  11/06/21 132/78  10/31/21 (!) 158/103  Most recent eGFR/CrCl:       Lab Results  Component Value Date    EGFR 30 (L) 12/25/2020    No components found for: CRCL   Evaluation of current treatment plan related to hypertension self management and patient's adherence to plan as established by provider. 12-04-2021: The patient did have some elevations in blood pressure in January but that was due to a fall and visit to the ER. The patient has more stable blood pressures at this time. The patient denies any new concerns with HTN or heart health. Saw the vascular specialist  12-01-2021. 02-06-2022: The patient is having more stable blood pressure readings. The patient has seen the heart specialist several times over the last couple of months and most recently the nephrologist. Her blood pressure on 01-29-2022 was 147/77. She denies any acute findings related to HTN or heart health;   Provided education to patient re: stroke prevention, s/s of heart attack and stroke; Reviewed prescribed diet heart healthy/ADA. 02-06-2022: Is compliant with heart healthy diet  Reviewed medications with patient and discussed importance of compliance. 02-06-2022: Is compliant with medications;  Discussed plans with patient for ongoing care management follow up and provided patient with direct contact information for care management team; Advised patient, providing education and rationale, to monitor blood pressure daily and record, calling PCP for findings outside established parameters;  Advised patient to discuss changes in blood pressure trends  with provider; Provided education on prescribed diet heart healthy/ADA diet ;  Discussed complications of poorly controlled blood pressure such as heart disease, stroke, circulatory complications, vision complications, kidney impairment, sexual dysfunction;      Pain:  (Status: Goal Met.) Long Term Goal  Pain assessment performed. 12-04-2021: The patient is rating her pain level at a 5 to 6 today on a scale of 0-10. The patient had a fall and is experiencing hip and back pain (chronic in nature).  The patient states it is not too bad today. 02-06-2022: The patient is rating her pain level at a 5 or 6 today. She was going to  have surgery and got cleared for surgery but decided not to have surgery. She has been working with PT and states that her pain is much better. The biggest area of concern right now is her left knee and it is just sore. She goes to see the vascular surgeon later this month and will discuss other options with him. Will continue to monitor  for changes.  Medications reviewed. 02-06-2022: The patient is taking Tylenol for pain relief.  Reviewed provider established plan for pain management. 12-04-2021: The patient had an MRI yesterday and is awaiting results. The patient is working with in home physical therapy. She has an appointment today and the therapist was there at the end of the call. She has had one previous treatment. She is hopeful this will help increase her strength and mobility. She has been using heat to the left hip and acquired a wound due to the heating pad sticking to  her upper thigh and it formed a blister. The area is covered and her daughter is changing the dressing every night. She states it is looking better and "healing". Review of sx and sx of infection and to call the office for changes; Discussed importance of adherence to all scheduled medical appointments. 02-06-2022: Has seen several providers recently and has follow up accordingly with providers. Will continue to monitor.  Counseled on the importance of reporting any/all new or changed pain symptoms or management strategies to pain management provider; Advised patient to report to care team affect of pain on daily activities; Discussed use of relaxation techniques and/or diversional activities to assist with pain reduction (distraction, imagery, relaxation, massage, acupressure, TENS, heat, and cold application; Reviewed with patient prescribed pharmacological and nonpharmacological pain relief strategies; Advised patient to discuss unresolved pain, changes in level or intensity of pain with provider;    Patient Goals/Self-Care Activities: Take medications as prescribed   Attend all scheduled provider appointments Call pharmacy for medication refills 3-7 days in advance of running out of medications Attend church or other social activities Perform all self care activities independently  Perform IADL's (shopping, preparing meals, housekeeping, managing finances)  independently Call provider office for new concerns or questions  Work with the social worker to address care coordination needs and will continue to work with the clinical team to address health care and disease management related needs call the Suicide and Crisis Lifeline: 988 call the [place] National Suicide Prevention Lifeline: 778-193-7278 or TTY: (239)767-3645 TTY 787-057-5149) to talk to a trained counselor call 1-800-273-TALK (toll free, 24 hour hotline) if experiencing a Mental Health or [place]  call office if I gain more than 2 pounds in one day or 5 pounds in one week keep legs up while sitting meet with dietitian track weight in diary use salt in moderation watch for swelling in feet, ankles and legs every day weigh myself daily begin a heart failure diary bring diary to all appointments develop a rescue plan follow rescue plan if symptoms flare-up eat more whole grains, fruits and vegetables, lean meats and healthy fats schedule appointment with eye doctor check blood sugar at prescribed times: before meals and at bedtime, when you have symptoms of low or high blood sugar, and before and after exercise check feet daily for cuts, sores or redness enter blood sugar readings and medication or insulin into daily log take the blood sugar log to all doctor visits take the blood sugar meter to all doctor visits set goal weight trim toenails straight across drink 6 to 8 glasses of water each day fill half of plate with vegetables limit fast food meals to no more than 1 per week manage portion size prepare main meal at home 3 to 5 days each week read food labels for fat, fiber, carbohydrates and portion size reduce red meat to 2 to 3 times a week keep feet up while sitting wash and dry feet carefully every day wear comfortable, cotton socks wear comfortable, well-fitting shoes - avoid second hand smoke - eliminate smoking in my home - identify and avoid  work-related triggers - identify and remove indoor air pollutants - limit outdoor activity during cold weather - listen for public air quality announcements every day - do breathing exercises every day - develop a rescue plan - eliminate symptom triggers at home - follow rescue plan if symptoms flare-up - keep follow-up appointments: 11-07-2021 at 240 pm  - use an extra pillow to sleep -  develop a new routine to improve sleep - don't eat or exercise right before bedtime - get at least 7 to 8 hours of sleep at night - use devices that will help like a cane, sock-puller or reacher - practice relaxation or meditation daily - do exercises in a comfortable position that makes breathing as easy as possible check blood pressure 3 times per week choose a place to take my blood pressure (home, clinic or office, retail store) write blood pressure results in a log or diary learn about high blood pressure keep a blood pressure log take blood pressure log to all doctor appointments call doctor for signs and symptoms of high blood pressure develop an action plan for high blood pressure keep all doctor appointments take medications for blood pressure exactly as prescribed report new symptoms to your doctor eat more whole grains, fruits and vegetables, lean meats and healthy fats - call for medicine refill 2 or 3 days before it runs out - take all medications exactly as prescribed - call doctor with any symptoms you believe are related to your medicine - call doctor when you experience any new symptoms - go to all doctor appointments as scheduled - adhere to prescribed diet: heart healthy/ADA      Our next appointment is by telephone on 06-30-2022  at 1 pm  Please call the care guide team at [phone] if you need to cancel or reschedule your appointment.   If you are experiencing a Mental Health or [place] or need someone to talk to, please call the Suicide and Crisis  Lifeline: 988 call the [place] National Suicide Prevention Lifeline: 563-093-0227 or TTY: 937-467-8591 TTY 310-187-9088) to talk to a trained counselor call 1-800-273-TALK (toll free, 24 hour hotline)   Patient verbalizes understanding of instructions and care plan provided today and agrees to view in [place]. Active MyChart status and patient understanding of how to access instructions and care plan via MyChart confirmed with patient.     Noreene Larsson RN, MSN, [facility] Mobile: [phone]

## 2022-05-12 NOTE — Patient Outreach (Signed)
  Care Coordination   Follow Up Visit Note   05/12/2022 Name: WAVERLEY KREMPASKY MRN: [id number] DOB: 07/04/1935  MARKISHA MEDING is a 87 y.o. year old female who sees [name], MD for primary care. I spoke with  [name] by phone today. Spoke with Judson Roch the patient's daughter and DRP  What matters to the patients health and wellness today?  Effective control of DM and having medications to manage DM effectively.    Goals Addressed             This Visit's Progress    RNCM: Effective Management of DM       Care Coordination Interventions: Lab Results  Component Value Date   HGBA1C 7.4 (H) 03/24/2022    Provided education to patient about basic DM disease process. 05-12-2022: Has long standing history of DM. The patients daughter Judson Roch helps her manage her care. Spoke with Judson Roch today who is in the home.  Reviewed medications with patient and discussed importance of medication adherence. 05-12-2022: Per Sarah Dr. Honor Junes has prescribed the patient Lantus 7 units in the am. The patient is now taking this again. She is going to have to call Dr. Honor Junes and get a refill for her sliding scale. Counseled on importance of regular laboratory monitoring as prescribed. 05-12-2022: The patient has regular lab work.  Discussed plans with patient for ongoing care management follow up and provided patient with direct contact information for care management team Provided patient with written educational materials related to hypo and hyperglycemia and importance of correct treatment. 05-12-2022: Judson Roch states her average blood sugars are about 160. Denies any issues with low or high blood sugars Reviewed scheduled/upcoming provider appointments including: 09-22-2022 with pcp, sees specialist on a regular basis Advised patient, providing education and rationale, to check cbg QID and record, calling pcp or endocrinologist  for findings outside established parameters Review of patient status,  including review of consultants reports, relevant laboratory and other test results, and medications completed Screening for signs and symptoms of depression related to chronic disease state  Assessed social determinant of health barriers          SDOH assessments and interventions completed:   Yes SDOH Interventions Today    Flowsheet Row Most Recent Value  SDOH Interventions   Social Connections Interventions Intervention Not Indicated       Care Coordination Interventions Activated:  Yes Care Coordination Interventions:   Yes, provided  Follow up plan: Follow up call scheduled for 06-30-2022 at 1 pm  Encounter Outcome:  Pt. Visit Completed  Noreene Larsson RN, MSN, [facility] Mobile: [phone]

## 2022-05-12 NOTE — Patient Instructions (Signed)
Visit Information  Thank you for taking time to visit with me today. Please don't hesitate to contact me if I can be of assistance to you before our next scheduled telephone appointment.  Following are the goals we discussed today:   RNCM: Effective Management of DM             Care Coordination Interventions:      Lab Results  Component Value Date    HGBA1C 7.4 (H) 03/24/2022    Provided education to patient about basic DM disease process. 05-12-2022: Has long standing history of DM. The patients daughter Jill Hudson helps her manage her care. Spoke with Jill Hudson today who is in the home.  Reviewed medications with patient and discussed importance of medication adherence. 05-12-2022: Per Sarah Dr. Honor Junes has prescribed the patient Lantus 7 units in the am. The patient is now taking this again. She is going to have to call Dr. Honor Junes and get a refill for her sliding scale. Counseled on importance of regular laboratory monitoring as prescribed. 05-12-2022: The patient has regular lab work.  Discussed plans with patient for ongoing care management follow up and provided patient with direct contact information for care management team Provided patient with written educational materials related to hypo and hyperglycemia and importance of correct treatment. 05-12-2022: Jill Hudson states her average blood sugars are about 160. Denies any issues with low or high blood sugars Reviewed scheduled/upcoming provider appointments including: 09-22-2022 with pcp, sees specialist on a regular basis Advised patient, providing education and rationale, to check cbg QID and record, calling pcp or endocrinologist  for findings outside established parameters Review of patient status, including review of consultants reports, relevant laboratory and other test results, and medications completed Screening for signs and symptoms of depression related to chronic disease state  Assessed social determinant of health barriers         Our next appointment is by telephone on 06-30-2022 at 1 pm   Please call the care guide team at [phone] if you need to cancel or reschedule your appointment.   If you are experiencing a Mental Health or [place] or need someone to talk to, please call the Suicide and Crisis Lifeline: 988 call the [place] National Suicide Prevention Lifeline: (719)703-1350 or TTY: 713-262-6287 TTY 9072954107) to talk to a trained counselor call 1-800-273-TALK (toll free, 24 hour hotline)   Patient verbalizes understanding of instructions and care plan provided today and agrees to view in [place]. Active MyChart status and patient understanding of how to access instructions and care plan via MyChart confirmed with patient.     Telephone follow up appointment with care management team member scheduled for: 06-30-2022 at 1 pm  [place], MSN, [facility] Mobile: [phone]

## 2022-05-12 NOTE — Telephone Encounter (Signed)
error 

## 2022-05-12 NOTE — Chronic Care Management (AMB) (Signed)
Chronic Care Management   CCM RN Visit Note  05/12/2022 Name: Jill Hudson MRN: [id number] DOB: 28-Oct-1934  Subjective: Jill Hudson is a 87 y.o. year old female who is a primary care patient of [name], MD. The care management team was consulted for assistance with disease management and care coordination needs.    Engaged with patient by telephone for follow up visit in response to provider referral for case management and/or care coordination services. Transition care to care coordination. Closing this care plan out  Consent to Services:  The patient was given information about Chronic Care Management services, agreed to services, and gave verbal consent prior to initiation of services.  Please see initial visit note for detailed documentation.   Patient agreed to services and verbal consent obtained.   Assessment: Review of patient past medical history, allergies, medications, health status, including review of consultants reports, laboratory and other test data, was performed as part of comprehensive evaluation and provision of chronic care management services.   SDOH (Social Determinants of Health) assessments and interventions performed:    CCM Care Plan  Allergies  Allergen Reactions   Saxagliptin Diarrhea   Epinephrine Other (See Comments)    Patient does not remember what happens when she uses this   Atorvastatin Other (See Comments)    Muscle aches   Codeine Other (See Comments)    Upset stomach   Ezetimibe Other (See Comments)    Myalgias(ZETIA)   Limonene Rash    Patient does not recall this reaction   Nitrofurantoin Rash and Other (See Comments)    Pruitus   Sulfa Antibiotics Rash and Other (See Comments)    Sore mouth     Outpatient Encounter Medications as of 05/12/2022  Medication Sig   acetaminophen (TYLENOL) 650 MG CR tablet Take 1,300 mg by mouth every 8 (eight) hours as needed for pain.   amLODipine (NORVASC) 5 MG tablet Take 5 mg by mouth in  the morning and at bedtime.   apixaban (ELIQUIS) 2.5 MG TABS tablet TAKE 1 TABLET(2.5 MG) BY MOUTH TWICE DAILY   cloNIDine (CATAPRES) 0.1 MG tablet Take 0.1 mg by mouth 2 (two) times daily as needed.   Cyanocobalamin 1000 MCG TBCR Take 1,000 mcg by mouth daily.   feeding supplement, GLUCERNA SHAKE, (GLUCERNA SHAKE) LIQD Take 237 mLs by mouth 3 (three) times daily between meals.   ferrous sulfate 325 (65 FE) MG tablet Take 325 mg by mouth daily with breakfast.   gabapentin (NEURONTIN) 100 MG capsule Take 1 capsule (100 mg total) by mouth at bedtime.   insulin aspart (NOVOLOG FLEXPEN) 100 UNIT/ML FlexPen Inject 3 Units into the skin 3 (three) times daily with meals.   insulin glargine (LANTUS) 100 UNIT/ML injection Inject 0.05 mLs (5 Units total) into the skin daily.   Insulin Pen Needle (B-D ULTRAFINE III SHORT PEN) 31G X 8 MM MISC USE AS DIRECTED   JARDIANCE 10 MG TABS tablet Take 10 mg by mouth daily.   losartan (COZAAR) 25 MG tablet Take 25 mg by mouth 2 (two) times daily. Unsure of dosage   melatonin 5 MG TABS Take 0.5 tablets (2.5 mg total) by mouth at bedtime as needed (sleep).   mirtazapine (REMERON) 15 MG tablet Take 1 tablet (15 mg total) by mouth at bedtime.   Multiple Vitamins-Minerals (PRESERVISION AREDS PO) Take 1 capsule by mouth 2 (two) times daily.    promethazine-dextromethorphan (PROMETHAZINE-DM) 6.25-15 MG/5ML syrup TAKE 5 ML BY MOUTH FOUR TIMES DAILY  AS NEEDED FOR COUGH   sertraline (ZOLOFT) 50 MG tablet TAKE 1 TABLET(50 MG) BY MOUTH DAILY   torsemide (DEMADEX) 20 MG tablet Take 2 tablets (40 mg total) by mouth daily. (Patient taking differently: Take 40 mg by mouth 2 (two) times daily. 1/2 tablet morning and 1/2 tablet at night.)   No facility-administered encounter medications on file as of 05/12/2022.    Patient Active Problem List   Diagnosis Date Noted   Acquired thrombophilia ([place]) 03/20/2021   Mood disorder ([place]) 05/02/2020   Chronic heart failure with preserved  ejection fraction ([place]) 04/12/2020   Diverticulosis of large intestine without perforation or abscess with bleeding 03/27/2020   AF (paroxysmal atrial fibrillation) (HCC)    COPD (chronic obstructive pulmonary disease) with chronic bronchitis ([place])    CKD (chronic kidney disease), stage IIIa 02/18/2020   Malnutrition of mild degree ([place]) 01/08/2020   Age-related macular degeneration, dry, left eye 06/21/2019   Age-related macular degeneration, wet, right eye ([place]) 06/21/2019   Moderate nonproliferative diabetic retinopathy associated with type 2 diabetes mellitus ([place]) 06/21/2019   Lumbosacral radiculopathy at L4 05/01/2019   Underweight 05/01/2019   Encounter for long-term (current) use of aspirin 10/31/2018   Long term current use of oral hypoglycemic drug 10/31/2018   Encounter for long-term (current) use of insulin ([place]) 10/31/2018   Peptic ulcer disease 08/11/2018   Leg pain 07/03/2017   Carpal tunnel syndrome on both sides 05/05/2017   Myalgia due to HMG CoA reductase inhibitor 05/05/2017   History of CVA (cerebrovascular accident) 02/15/2017   Degenerative disc disease, lumbar 12/21/2016   Atherosclerosis of native arteries of extremity with intermittent claudication ([place]) 12/20/2016   Bilateral carotid artery stenosis 12/20/2016   Occlusion and stenosis of bilateral carotid arteries 12/20/2016   Hip bursitis 05/18/2016   Elevated TSH 01/18/2016   CKD stage 3 due to type 2 diabetes mellitus ([place]) 01/16/2016   Senile ecchymosis 01/16/2016   Type II diabetes mellitus with complication ([place]) 42/59/5638   GERD (gastroesophageal reflux disease) 07/24/2015   PVD (peripheral vascular disease) ([place]) 05/17/2015   Neoplasm of uncertain behavior of skin 05/17/2015   Retinopathy, diabetic, proliferative ([place]) 04/11/2015   Hyperlipidemia associated with type 2 diabetes mellitus ([place]) 04/11/2015   Essential hypertension 04/11/2015   Generalized OA 04/11/2015   Proliferative diabetic  retinopathy(362.02) 04/11/2015   Arteriosclerosis of coronary artery 05/29/2013   Hypertensive heart disease without CHF 05/29/2013    Conditions to be addressed/monitored:CHF, HTN, HLD, COPD, DMII, Depression, and Chronic pain   Care Plan : RNCM: General Plan of Care (Adult) for Chronic Disease Management and Care Coordination Needs  Updates made by [name], RN since 05/12/2022 12:00 AM  Completed 05/12/2022   Problem: RNCM: Development of Plan of Care for Chronic Disease Management (CHF, DM, HTN, HLD, COPD, falls, depression, Chronic Pain) Resolved 05/12/2022  Priority: High     Long-Range Goal: RNCM: Effective Management of Plan of Care for Chronic Disease Management (CHF, DM, HTN, HLD, COPD, falls, depression, Chronic pain) Completed 05/12/2022  Start Date: 10/15/2021  Expected End Date: 10/15/2022  Priority: High  Note:   Current Barriers: Resolving, duplicate goals. See new plan of care in care coordination  Knowledge Deficits related to plan of care for management of CHF, HTN, HLD, COPD, Chronic Pain, Depression: depressed mood anxiety, and falls prevention and safety  Chronic Disease Management support and education needs related to CHF, HTN, HLD, COPD, Chronic pain, DMII, Depression: depressed mood anxiety, and falls prevention and safety  RNCM Clinical Goal(s):  Patient will verbalize basic understanding of CHF, HTN, HLD, COPD, Chronic pain, DMII, Depression, and Falls prevention and safety  disease process and self health management plan as evidenced by following the plan of care, taking medications as directed, following dietary restrictions, and working with the pcp and CCM team to optimize health and well being take all medications exactly as prescribed and will call provider for medication related questions as evidenced by compliance with medications and calling for refills before running out of medications    demonstrate understanding of rationale for each prescribed  medication as evidenced by compliance with medications regimen    attend all scheduled medical appointments: 03-24-2022 with pcp, specialist appointments coming up, as evidenced by keeping appointments and calling for schedule change needs         demonstrate improved and ongoing adherence to prescribed treatment plan for CHF, HTN, HLD, COPD, DMII, Chronic pain, Depression, and falls prevention and safety  as evidenced by stable conditions and calling the office for questions or changes  demonstrate a decrease in CHF, HTN, HLD, COPD, DMII, Depression, Chronic pain, and falls  exacerbations  as evidenced by stable conditions, normal VS, normal A1C, and regular outreaches to help with effective management of health and well being  demonstrate ongoing self health care management ability for effective management of chronic conditions  as evidenced by  working with the CCM team  through collaboration with Consulting civil engineer, provider, and care team.   Interventions: 1:1 collaboration with primary care provider regarding development and update of comprehensive plan of care as evidenced by provider attestation and co-signature Inter-disciplinary care team collaboration (see longitudinal plan of care) Evaluation of current treatment plan related to  self management and patient's adherence to plan as established by provider   Heart Failure Interventions:  (Status: Goal Met.)  Long Term Goal 05-12-2022: Goals met and care plan is being closed  Basic overview and discussion of pathophysiology of Heart Failure reviewed. 02-06-2022: The patient is compliant with the plan of care for effective management of HF. The patient states that she got clearance for back surgery recently; however she has decided not to have it now. She is happy with that decision.  Provided education on low sodium diet. 02-06-2022: The patient is compliant with a heart healthy/ADA diet Reviewed Heart Failure Action Plan in depth and provided  written copy Assessed need for readable accurate scales in home. 02-06-2022: Weighs safely at home and weight is stable. Denies any fluctuations Provided education about placing scale on hard, flat surface Advised patient to weigh each morning after emptying bladder. 02-06-2022: The patient has a stable weight of 117. Review of safety when weighing due to high risk for falls and injury Discussed importance of daily weight and advised patient to weigh and record daily Reviewed role of diuretics in prevention of fluid overload and management of heart failure Discussed the importance of keeping all appointments with provider. Has seen cardiology recently and has a follow up with her vascular surgeon later this month. Will continue to monitor for changes and new needs.  Provided patient with education about the role of exercise in the management of heart failure Screening for signs and symptoms of depression related to chronic disease state  Assessed social determinant of health barriers  COPD: (Status: Goal Met.) Long Term Goal  Reviewed medications with patient, including use of prescribed maintenance and rescue inhalers, and provided instruction on medication management and the importance of adherence. 4-14--2023: The  patient is compliant with medications. Denies any new concerns with COPD medication management. Denies any acute changes in COPD and effective management of COPD.  Provided patient with basic written and verbal COPD education on self care/management/and exacerbation prevention Advised patient to track and manage COPD triggers. 02-06-2022: The patient knows factors that trigger her COPD. She is stable with her COPD at this time. Will continue to monitor.  Provided written and verbal instructions on pursed lip breathing and utilized returned demonstration as teach back Provided instruction about proper use of medications used for management of COPD including inhalers Advised patient to self  assesses COPD action plan zone and make appointment with provider if in the yellow zone for 48 hours without improvement Advised patient to engage in light exercise as tolerated 3-5 days a week to aid in the the management of COPD Provided education about and advised patient to utilize infection prevention strategies to reduce risk of respiratory infection. 02-06-2022: The patient denies any issues with increase risk of infection at this time. Review of staying safe and monitoring for high risk areas that could cause exacerbation of COPD.  Discussed the importance of adequate rest and management of fatigue with COPD  Diabetes:  (Status: Goal Met.) Long Term Goal   Lab Results  Component Value Date   HGBA1C 7.5 06/16/2021  Recent A1C at endocrinology office was 7.4 on 10-31-2021 Assessed patient's understanding of A1c goal: <7% Provided education to patient about basic DM disease process; Reviewed medications with patient and discussed importance of medication adherence. 02-06-2022: The patient is compliant with medications;        Reviewed prescribed diet with patient heart healthy/ADA. 02-06-2022: The patient is compliant with heart healthy/ADA diet; Counseled on importance of regular laboratory monitoring as prescribed. 02-06-2022: The patient has regular lab work. Last at endocrinology with A1C of 7.4        Discussed plans with patient for ongoing care management follow up and provided patient with direct contact information for care management team;      Provided patient with written educational materials related to hypo and hyperglycemia and importance of correct treatment. 02-06-2022: The patient denies any lows or highs at this time. Is checking her blood sugars 4 times a day.        Reviewed scheduled/upcoming provider appointments including: 03-24-2022 with pcp, saw endocrinologist on 10-31-2021;         Advised patient, providing education and rationale, to check cbg before meals and at bedtime,  when you have symptoms of low or high blood sugar, before and after exercise, and the patient has a continuous glucose reader   and record. 10-15-2021: The patient states that she has been having to keep it on her right arm and it is a little sore because she had a fall and had a scabbed area on her left arm. Education on rotating sites and that likely it is sore due to keeping it on the same arm. Education and support given. States her readings have been 108 to 160. Denies any real lows or real highs. Knows how to effectively manage hypoglycemic or hyperglycemic events. 12-04-2021: The patient is waiting on her [place] supplies. She states it has been 2 weeks since she ordered them . She has been manually checking her blood sugars. States this am was 199. She denies any acute findings related to her DM and blood sugars. Knows how to effectively manage. States her average is about 199. 02-06-2022: The patient is doing well  with effective management of her DM. States she woke up the other night and was concerned about her blood sugars but it was 140. Denies any acute findings related to blood sugars or DM health and well being.    call provider for findings outside established parameters;       Review of patient status, including review of consultants reports, relevant laboratory and other test results, and medications completed;     Last eye exam in December on 10-10-2021. The patient was to have shots in her eyes on 11-11-2021 for macular degeneration but this had to be changed due to other things going on with the patient. The patient will go next week for injections in her eye (12-04-2021). 02-06-2022: The patient continues to see the eye doctor on a regular basis. Will have additional shots in her eye later this month. Will continue to monitor.    Falls:  (Status: Goal Met.) Long Term Goal  Provided written and verbal education re: potential causes of falls and Fall prevention strategies Reviewed  medications and discussed potential side effects of medications such as dizziness and frequent urination. 02-06-2022: The patient states that her daughter Judson Roch helps her with keeping her medications straight. She states that she is compliant with medications and aware of side effects to monitor for.  Advised patient of importance of notifying provider of falls. 02-06-2022: Review and education provided  Assessed for signs and symptoms of orthostatic hypotension. 02-06-2022: Denies any issues with orthostatic hypotension at this time. Review of being safe and monitoring for blood pressure changes.  Assessed for falls since last encounter. 12-21-20[place] x 2 last week in her home. One was when she tripped over a rug and the second time was when she was stepping up in the living room area. She has a scabbed area to her left arm and states that her left leg gave out. Education on falls prevention and safety. 12-04-2021: The patient has fallen again and was in the ER for left hip pain. She does not know what caused her to fall. She had an MRI yesterday. She is taking Tylenol for pain relief.( See pain management in plan of care) She endorses being safe and monitoring her surroundings; but she is still experiencing falls. 02-06-2022: The patient denies any new falls at this time. She is being safe. She uses her rolling walker with a seat. She states that she is being careful. Has been working with PT and does the exercises on days that she doesn't work with PT. The patient is doing well and has decided against having back surgery.  Assessed patients knowledge of fall risk prevention secondary to previously provided education. 12-04-2021: Review and education provided. Is working with in home physical therapy. They had come to work with the patient at the end of the call. The patient has only had one session but she is hopeful it will help her with strengthening and ambulation. 02-06-2022: The patient is working with PT and  is doing well. She does the exercises on days they do not come. Education and support given.  Provided patient information for fall alert systems. 10-15-2021: The patient is switching to [facility] coverage in January. Education provided to have the patient or patients daughter to call and see if they have a life alert system through the insurance coverage for the patient. The patient states this would give her peace of mind and be helpful if she was alone with just her and her husband at home.  She will inquire about life alert system.  Assessed working status of life alert bracelet and patient adherence Advised patient to discuss call the office for recommendations when new falls occur with provider  Depression   (Status: Goal Met.) Long Term Goal  Evaluation of current treatment plan related to Depression, Mental Health Concerns  self-management and patient's adherence to plan as established by provider. 12-04-2021: The patient states she is doing well with her depression. She has been focused on her health and well being and getting herself stable with walking and her pain under control. She denies any acute distress. The patient states that she has a good support from her daughter Clarise Cruz and other family. She feels blessed and fortunate. Will continue to monitor for new concerns or issues related to mental health and wellbeing. 02-06-2022: The patient is happy and doing well today. She is pleased with the decision not to have surgery. She talked with her family about it and even though she got clearance from her heart specialist she has decided not to move forward with surgical intervention for her back pain. It is stable currently. She talked about how much her family means to her and is thankful for the support she has.  Discussed plans with patient for ongoing care management follow up and provided patient with direct contact information for care management team Advised patient to call the office for  changes in mood, anxiety, or depression ; Provided education to patient re: being around positive people, doing things she enjoys, talking to friends and family on the phone and utilization of resources when feeling down and depressed; Reviewed scheduled/upcoming provider appointments including 03-24-2022 Discussed plans with patient for ongoing care management follow up and provided patient with direct contact information for care management team; Advised patient to discuss new concerns about depression or questions related to mental health with provider;  Hyperlipidemia:  (Status: Goal Met.) Long Term Goal  Lab Results  Component Value Date   CHOL 80 08/19/2021   HDL 56 08/19/2021   [place] 1 08/19/2021   TRIG 115 08/19/2021   CHOLHDL 2.1 02/19/2020     Medication review performed; medication list updated in electronic medical record. 02-06-2022: The patient takes Repatha but has been discontinued off of Crestor. Denies any acute issues at this time with her medications.  Provider established cholesterol goals reviewed. 02-06-2022: The patient is at goal. ; Counseled on importance of regular laboratory monitoring as prescribed. 02-06-2022: Has regular lab work. Last in October of 2022; Provided HLD educational materials; Reviewed role and benefits of statin for ASCVD risk reduction; Discussed strategies to manage statin-induced myalgias; Reviewed importance of limiting foods high in cholesterol. 02-06-2022: Compliance noted with heart  healthy/ADA diet; Reviewed exercise goals and target of 150 minutes per week;  Hypertension: (Status: Goal Met.) Last practice recorded BP readings:  BP Readings from Last 3 Encounters:  12/01/21 125/63  11/06/21 132/78  10/31/21 (!) 158/103  Most recent eGFR/CrCl:  Lab Results  Component Value Date   EGFR 30 (L) 12/25/2020    No components found for: CRCL  Evaluation of current treatment plan related to hypertension self management and patient's  adherence to plan as established by provider. 12-04-2021: The patient did have some elevations in blood pressure in January but that was due to a fall and visit to the ER. The patient has more stable blood pressures at this time. The patient denies any new concerns with HTN or heart health. Saw the vascular specialist 12-01-2021. 02-06-2022:  The patient is having more stable blood pressure readings. The patient has seen the heart specialist several times over the last couple of months and most recently the nephrologist. Her blood pressure on 01-29-2022 was 147/77. She denies any acute findings related to HTN or heart health;   Provided education to patient re: stroke prevention, s/s of heart attack and stroke; Reviewed prescribed diet heart healthy/ADA. 02-06-2022: Is compliant with heart healthy diet  Reviewed medications with patient and discussed importance of compliance. 02-06-2022: Is compliant with medications;  Discussed plans with patient for ongoing care management follow up and provided patient with direct contact information for care management team; Advised patient, providing education and rationale, to monitor blood pressure daily and record, calling PCP for findings outside established parameters;  Advised patient to discuss changes in blood pressure trends  with provider; Provided education on prescribed diet heart healthy/ADA diet ;  Discussed complications of poorly controlled blood pressure such as heart disease, stroke, circulatory complications, vision complications, kidney impairment, sexual dysfunction;    Pain:  (Status: Goal Met.) Long Term Goal  Pain assessment performed. 12-04-2021: The patient is rating her pain level at a 5 to 6 today on a scale of 0-10. The patient had a fall and is experiencing hip and back pain (chronic in nature).  The patient states it is not too bad today. 02-06-2022: The patient is rating her pain level at a 5 or 6 today. She was going to  have surgery and got  cleared for surgery but decided not to have surgery. She has been working with PT and states that her pain is much better. The biggest area of concern right now is her left knee and it is just sore. She goes to see the vascular surgeon later this month and will discuss other options with him. Will continue to monitor for changes.  Medications reviewed. 02-06-2022: The patient is taking Tylenol for pain relief.  Reviewed provider established plan for pain management. 12-04-2021: The patient had an MRI yesterday and is awaiting results. The patient is working with in home physical therapy. She has an appointment today and the therapist was there at the end of the call. She has had one previous treatment. She is hopeful this will help increase her strength and mobility. She has been using heat to the left hip and acquired a wound due to the heating pad sticking to her upper thigh and it formed a blister. The area is covered and her daughter is changing the dressing every night. She states it is looking better and "healing". Review of sx and sx of infection and to call the office for changes; Discussed importance of adherence to all scheduled medical appointments. 02-06-2022: Has seen several providers recently and has follow up accordingly with providers. Will continue to monitor.  Counseled on the importance of reporting any/all new or changed pain symptoms or management strategies to pain management provider; Advised patient to report to care team affect of pain on daily activities; Discussed use of relaxation techniques and/or diversional activities to assist with pain reduction (distraction, imagery, relaxation, massage, acupressure, TENS, heat, and cold application; Reviewed with patient prescribed pharmacological and nonpharmacological pain relief strategies; Advised patient to discuss unresolved pain, changes in level or intensity of pain with provider;   Patient Goals/Self-Care Activities: Take  medications as prescribed   Attend all scheduled provider appointments Call pharmacy for medication refills 3-7 days in advance of running out of medications Attend church or other social activities Perform  all self care activities independently  Perform IADL's (shopping, preparing meals, housekeeping, managing finances) independently Call provider office for new concerns or questions  Work with the social worker to address care coordination needs and will continue to work with the clinical team to address health care and disease management related needs call the Suicide and Crisis Lifeline: 988 call the [place] National Suicide Prevention Lifeline: 765 362 3942 or TTY: (646)358-2114 TTY (667)662-9388) to talk to a trained counselor call 1-800-273-TALK (toll free, 24 hour hotline) if experiencing a Mental Health or [place]  call office if I gain more than 2 pounds in one day or 5 pounds in one week keep legs up while sitting meet with dietitian track weight in diary use salt in moderation watch for swelling in feet, ankles and legs every day weigh myself daily begin a heart failure diary bring diary to all appointments develop a rescue plan follow rescue plan if symptoms flare-up eat more whole grains, fruits and vegetables, lean meats and healthy fats schedule appointment with eye doctor check blood sugar at prescribed times: before meals and at bedtime, when you have symptoms of low or high blood sugar, and before and after exercise check feet daily for cuts, sores or redness enter blood sugar readings and medication or insulin into daily log take the blood sugar log to all doctor visits take the blood sugar meter to all doctor visits set goal weight trim toenails straight across drink 6 to 8 glasses of water each day fill half of plate with vegetables limit fast food meals to no more than 1 per week manage portion size prepare main meal at home 3 to 5 days each  week read food labels for fat, fiber, carbohydrates and portion size reduce red meat to 2 to 3 times a week keep feet up while sitting wash and dry feet carefully every day wear comfortable, cotton socks wear comfortable, well-fitting shoes - avoid second hand smoke - eliminate smoking in my home - identify and avoid work-related triggers - identify and remove indoor air pollutants - limit outdoor activity during cold weather - listen for public air quality announcements every day - do breathing exercises every day - develop a rescue plan - eliminate symptom triggers at home - follow rescue plan if symptoms flare-up - keep follow-up appointments: 11-07-2021 at 240 pm  - use an extra pillow to sleep - develop a new routine to improve sleep - don't eat or exercise right before bedtime - get at least 7 to 8 hours of sleep at night - use devices that will help like a cane, sock-puller or reacher - practice relaxation or meditation daily - do exercises in a comfortable position that makes breathing as easy as possible check blood pressure 3 times per week choose a place to take my blood pressure (home, clinic or office, retail store) write blood pressure results in a log or diary learn about high blood pressure keep a blood pressure log take blood pressure log to all doctor appointments call doctor for signs and symptoms of high blood pressure develop an action plan for high blood pressure keep all doctor appointments take medications for blood pressure exactly as prescribed report new symptoms to your doctor eat more whole grains, fruits and vegetables, lean meats and healthy fats - call for medicine refill 2 or 3 days before it runs out - take all medications exactly as prescribed - call doctor with any symptoms you believe are related to your medicine -  call doctor when you experience any new symptoms - go to all doctor appointments as scheduled - adhere to prescribed diet:  heart healthy/ADA       Plan:Telephone follow up appointment with care management team member scheduled for:  06-30-2022 at 1 pm  [place], MSN, [facility] Mobile: [phone]

## 2022-05-15 DIAGNOSIS — Z794 Long term (current) use of insulin: Secondary | ICD-10-CM | POA: Diagnosis not present

## 2022-05-15 DIAGNOSIS — E119 Type 2 diabetes mellitus without complications: Secondary | ICD-10-CM | POA: Diagnosis not present

## 2022-05-25 ENCOUNTER — Other Ambulatory Visit: Payer: Self-pay | Admitting: Internal Medicine

## 2022-05-25 DIAGNOSIS — E118 Type 2 diabetes mellitus with unspecified complications: Secondary | ICD-10-CM

## 2022-05-25 DIAGNOSIS — E1169 Type 2 diabetes mellitus with other specified complication: Secondary | ICD-10-CM | POA: Diagnosis not present

## 2022-05-25 DIAGNOSIS — I10 Essential (primary) hypertension: Secondary | ICD-10-CM

## 2022-05-25 DIAGNOSIS — I5032 Chronic diastolic (congestive) heart failure: Secondary | ICD-10-CM | POA: Diagnosis not present

## 2022-05-25 DIAGNOSIS — J449 Chronic obstructive pulmonary disease, unspecified: Secondary | ICD-10-CM

## 2022-05-25 DIAGNOSIS — E785 Hyperlipidemia, unspecified: Secondary | ICD-10-CM | POA: Diagnosis not present

## 2022-05-26 NOTE — Telephone Encounter (Signed)
Requested Prescriptions  Pending Prescriptions Disp Refills  . promethazine-dextromethorphan (PROMETHAZINE-DM) 6.25-15 MG/5ML syrup [Pharmacy Med Name: PROMETHAZINE DM SYRUP] 180 mL 0    Sig: TAKE 5 ML BY MOUTH FOUR TIMES DAILY AS NEEDED FOR COUGH     Ear, Nose, and Throat:  Antitussives/Expectorants Passed - 05/25/2022  1:59 PM      Passed - Valid encounter within last 12 months    Recent Outpatient Visits          2 months ago Annual physical exam   [facility] [name], MD   3 months ago Unintentional weight loss   [facility] [name], MD   6 months ago Cellulitis of left lower extremity   [facility] [name], MD   8 months ago Essential hypertension   [facility] [name], MD   1 year ago Cellulitis of left lower extremity   [facility] [name], MD      Future Appointments            In 3 months Army Melia Jesse Sans, MD [facility], [facility]

## 2022-06-02 DIAGNOSIS — H353211 Exudative age-related macular degeneration, right eye, with active choroidal neovascularization: Secondary | ICD-10-CM | POA: Diagnosis not present

## 2022-06-03 ENCOUNTER — Other Ambulatory Visit (INDEPENDENT_AMBULATORY_CARE_PROVIDER_SITE_OTHER): Payer: Self-pay | Admitting: Vascular Surgery

## 2022-06-03 DIAGNOSIS — Z9889 Other specified postprocedural states: Secondary | ICD-10-CM

## 2022-06-03 DIAGNOSIS — I6523 Occlusion and stenosis of bilateral carotid arteries: Secondary | ICD-10-CM

## 2022-06-04 ENCOUNTER — Ambulatory Visit (INDEPENDENT_AMBULATORY_CARE_PROVIDER_SITE_OTHER): Payer: Medicare HMO

## 2022-06-04 ENCOUNTER — Ambulatory Visit (INDEPENDENT_AMBULATORY_CARE_PROVIDER_SITE_OTHER): Payer: Medicare HMO | Admitting: Vascular Surgery

## 2022-06-04 ENCOUNTER — Encounter (INDEPENDENT_AMBULATORY_CARE_PROVIDER_SITE_OTHER): Payer: Self-pay | Admitting: Vascular Surgery

## 2022-06-04 VITALS — BP 150/72 | HR 62 | Resp 18

## 2022-06-04 DIAGNOSIS — Z9889 Other specified postprocedural states: Secondary | ICD-10-CM

## 2022-06-04 DIAGNOSIS — I2510 Atherosclerotic heart disease of native coronary artery without angina pectoris: Secondary | ICD-10-CM | POA: Diagnosis not present

## 2022-06-04 DIAGNOSIS — I739 Peripheral vascular disease, unspecified: Secondary | ICD-10-CM

## 2022-06-04 DIAGNOSIS — I70212 Atherosclerosis of native arteries of extremities with intermittent claudication, left leg: Secondary | ICD-10-CM

## 2022-06-04 DIAGNOSIS — I6523 Occlusion and stenosis of bilateral carotid arteries: Secondary | ICD-10-CM

## 2022-06-04 DIAGNOSIS — I10 Essential (primary) hypertension: Secondary | ICD-10-CM

## 2022-06-04 DIAGNOSIS — I480 Paroxysmal atrial fibrillation: Secondary | ICD-10-CM | POA: Diagnosis not present

## 2022-06-04 NOTE — Progress Notes (Signed)
MRN : [id number]  Jill Hudson is a 87 y.o. (31-Mar-1935) female who presents with chief complaint of check circulation.  History of Present Illness:   The patient returns to the office for followup and review of the noninvasive studies. There have been no interval changes in lower extremity symptoms. No interval shortening of the patient's claudication distance or development of rest pain symptoms. No new ulcers or wounds have occurred since the last visit.   Procedure 10/12/2018: Redo left common femoral, superficial femoral and profunda femoris endarterectomy with Cormatrix patch angioplasty Left superficial femoral and above-knee popliteal artery angioplasty and stent placement   There have been no significant changes to the patient's overall health care.   The patient denies amaurosis fugax or recent TIA symptoms. There are no recent neurological changes noted. The patient denies history of DVT, PE or superficial thrombophlebitis. The patient denies recent episodes of angina or shortness of breath.    ABI Rt=[place] and Lt=0.92; TBI's Rt=0.78 and LT=0.76  (previous ABI's Rt=[place] and Lt=1.16; TBI's Rt=0.69 and LT=0.69)   Previous duplex ultrasound of the carotid arteries demonstrates RICA 40-59% stenosis and 2-42% LICA stenosis.  Current Meds  Medication Sig   acetaminophen (TYLENOL) 650 MG CR tablet Take 1,300 mg by mouth every 8 (eight) hours as needed for pain.   amLODipine (NORVASC) 5 MG tablet Take 5 mg by mouth in the morning and at bedtime.   apixaban (ELIQUIS) 2.5 MG TABS tablet TAKE 1 TABLET(2.5 MG) BY MOUTH TWICE DAILY   cloNIDine (CATAPRES) 0.1 MG tablet Take 0.1 mg by mouth 2 (two) times daily as needed.   Cyanocobalamin 1000 MCG TBCR Take 1,000 mcg by mouth daily.   feeding supplement, GLUCERNA SHAKE, (GLUCERNA SHAKE) LIQD Take 237 mLs by mouth 3 (three) times daily between meals.   ferrous sulfate 325 (65 FE) MG tablet Take 325 mg by mouth daily with  breakfast.   gabapentin (NEURONTIN) 100 MG capsule Take 1 capsule (100 mg total) by mouth at bedtime.   insulin glargine (LANTUS) 100 UNIT/ML injection Inject 0.05 mLs (5 Units total) into the skin daily.   Insulin Pen Needle (B-D ULTRAFINE III SHORT PEN) 31G X 8 MM MISC USE AS DIRECTED   JARDIANCE 10 MG TABS tablet Take 10 mg by mouth daily.   losartan (COZAAR) 25 MG tablet Take 25 mg by mouth 2 (two) times daily. Unsure of dosage   melatonin 5 MG TABS Take 0.5 tablets (2.5 mg total) by mouth at bedtime as needed (sleep).   Multiple Vitamins-Minerals (PRESERVISION AREDS PO) Take 1 capsule by mouth 2 (two) times daily.    promethazine-dextromethorphan (PROMETHAZINE-DM) 6.25-15 MG/5ML syrup TAKE 5 ML BY MOUTH FOUR TIMES DAILY AS NEEDED FOR COUGH   REPATHA PUSHTRONEX SYSTEM 420 MG/3.5ML SOCT SMARTSIG:420 Milligram(s) SUB-Q Once a Month   torsemide (DEMADEX) 20 MG tablet Take 2 tablets (40 mg total) by mouth daily. (Patient taking differently: Take 40 mg by mouth 2 (two) times daily. 1/2 tablet morning and 1/2 tablet at night.)    Past Medical History:  Diagnosis Date   Acute CHF (congestive heart failure) ([place]) 3/53/6144   Acute diastolic CHF (congestive heart failure) ([place])    Acute kidney injury superimposed on CKD (HCC)    Allergies    Anxiety    Arthritis    spine and shoulder   Atherosclerosis of artery of extremity with rest pain ([place]) 10/12/2018  Cancer ([place])    skin   CHF (congestive heart failure) (HCC)    Diabetes mellitus without complication ([place])    Diverticulosis of large intestine with hemorrhage    GI bleeding 12/25/2019   Heart murmur    Hyperlipidemia    Hypertension    Macula lutea degeneration    Mitral and aortic valve disease    Myocardial infarction ([place])    may have had a "light" heart attack   Non-ST elevation (NSTEMI) myocardial infarction (HCC)    Occasional tremors    PAD (peripheral artery disease) (HCC)    Rectal bleeding    Shingles    patient  unaware but daughter confirms. it was a long time ago   Stroke [facility]) 01/2017   may have had a slight stroke   TIA (transient ischemic attack) 01/2017   UTI (urinary tract infection)    Vascular disease, peripheral ([place])     Past Surgical History:  Procedure Laterality Date   CARDIAC CATHETERIZATION  1998   40% LM, 95% Ramus interm   CATARACT EXTRACTION, BILATERAL     COLONOSCOPY WITH PROPOFOL N/A 08/12/2018   Procedure: COLONOSCOPY WITH PROPOFOL;  Surgeon: Lucilla Lame, MD;  Location: [facility] ENDOSCOPY;  Service: Endoscopy;  Laterality: N/A;   ENDARTERECTOMY FEMORAL Left 10/12/2018   Procedure: ENDARTERECTOMY FEMORAL;  Surgeon: Katha Cabal, MD;  Location: [facility] ORS;  Service: Vascular;  Laterality: Left;  angioplasty and left SFA stent placement   EYE SURGERY Bilateral    cataract extractions   LOWER EXTREMITY ANGIOGRAPHY Left 08/23/2017   Procedure: Lower Extremity Angiography;  Surgeon: Algernon Huxley, MD;  Location: [place] [facility];  Service: Cardiovascular;  Laterality: Left;   LOWER EXTREMITY ANGIOGRAPHY Left 07/26/2018   Procedure: LOWER EXTREMITY ANGIOGRAPHY;  Surgeon: Katha Cabal, MD;  Location: [place] [facility];  Service: Cardiovascular;  Laterality: Left;   LOWER EXTREMITY ANGIOGRAPHY Left 09/16/2018   Procedure: LOWER EXTREMITY ANGIOGRAPHY;  Surgeon: Katha Cabal, MD;  Location: [place] [facility];  Service: Cardiovascular;  Laterality: Left;   PTCA  08/2013   Left common iliac   PTCA  12/2012   left ext iliac   RIGHT HEART CATH N/A 02/23/2020   Procedure: RIGHT HEART CATH;  Surgeon: Minna Merritts, MD;  Location: [place] [facility];  Service: Cardiovascular;  Laterality: N/A;   TUBAL LIGATION      Social History Social History   Tobacco Use   Smoking status: Former    Packs/day: 2.00    Years: 37.00    Total pack years: 74.00    Types: Cigarettes    Quit date: 1980    Years since quitting: 43.6   Smokeless tobacco: Never   Tobacco  comments:    smoking cessation materials not required  Vaping Use   Vaping Use: Never used  Substance Use Topics   Alcohol use: No    Alcohol/week: 0.0 standard drinks of alcohol   Drug use: No    Family History Family History  Problem Relation Age of Onset   Dementia Mother    Diabetes Father     Allergies  Allergen Reactions   Saxagliptin Diarrhea   Epinephrine Other (See Comments)    Patient does not remember what happens when she uses this   Atorvastatin Other (See Comments)    Muscle aches   Codeine Other (See Comments)    Upset stomach   Ezetimibe Other (See Comments)    Myalgias(ZETIA)   Limonene Rash  Patient does not recall this reaction   Nitrofurantoin Rash and Other (See Comments)    Pruitus   Sulfa Antibiotics Rash and Other (See Comments)    Sore mouth      REVIEW OF SYSTEMS (Negative unless checked)  Constitutional: '[]'$ Weight loss  '[]'$ Fever  '[]'$ Chills Cardiac: '[]'$ Chest pain   '[]'$ Chest pressure   '[]'$ Palpitations   '[]'$ Shortness of breath when laying flat   '[]'$ Shortness of breath with exertion. Vascular:  '[x]'$ Pain in legs with walking   '[]'$ Pain in legs at rest  '[]'$ History of DVT   '[]'$ Phlebitis   '[]'$ Swelling in legs   '[]'$ Varicose veins   '[]'$ Non-healing ulcers Pulmonary:   '[]'$ Uses home oxygen   '[]'$ Productive cough   '[]'$ Hemoptysis   '[]'$ Wheeze  '[]'$ COPD   '[]'$ Asthma Neurologic:  '[]'$ Dizziness   '[]'$ Seizures   '[]'$ History of stroke   '[]'$ History of TIA  '[]'$ Aphasia   '[]'$ Vissual changes   '[]'$ Weakness or numbness in arm   '[]'$ Weakness or numbness in leg Musculoskeletal:   '[]'$ Joint swelling   '[]'$ Joint pain   '[]'$ Low back pain Hematologic:  '[]'$ Easy bruising  '[]'$ Easy bleeding   '[]'$ Hypercoagulable state   '[]'$ Anemic Gastrointestinal:  '[]'$ Diarrhea   '[]'$ Vomiting  '[]'$ Gastroesophageal reflux/heartburn   '[]'$ Difficulty swallowing. Genitourinary:  '[]'$ Chronic kidney disease   '[]'$ Difficult urination  '[]'$ Frequent urination   '[]'$ Blood in urine Skin:  '[]'$ Rashes   '[]'$ Ulcers  Psychological:  '[]'$ History of anxiety   '[]'$  History of major  depression.  Physical Examination  Vitals:   06/04/22 1410  BP: (!) 150/72  Pulse: 62  Resp: 18   There is no height or weight on file to calculate BMI. Gen: WD/WN, NAD Head: [place]/AT, No temporalis wasting.  Ear/Nose/Throat: Hearing grossly intact, nares w/o erythema or drainage Eyes: PER, EOMI, sclera nonicteric.  Neck: Supple, no masses.  No bruit or JVD.  Pulmonary:  Good air movement, no audible wheezing, no use of accessory muscles.  Cardiac: RRR, normal S1, S2, no Murmurs. Vascular:  mild trophic changes, no open wounds; right carotid bruit Vessel Right Left  Radial Palpable Palpable  PT Not Palpable Not Palpable  DP Not Palpable Not Palpable  Gastrointestinal: soft, non-distended. No guarding/no peritoneal signs.  Musculoskeletal: M/S 5/5 throughout.  No visible deformity.  Neurologic: CN 2-12 intact. Pain and light touch intact in extremities.  Symmetrical.  Speech is fluent. Motor exam as listed above. Psychiatric: Judgment intact, Mood & affect appropriate for pt's clinical situation. Dermatologic: No rashes or ulcers noted.  No changes consistent with cellulitis.   CBC Lab Results  Component Value Date   WBC 7.9 03/24/2022   HGB 11.2 03/24/2022   HCT 33.0 (L) 03/24/2022   MCV 91 03/24/2022   PLT 273 03/24/2022    BMET    Component Value Date/Time   NA 137 03/24/2022 1053   NA 138 05/29/2014 1202   K 4.2 03/24/2022 1053   K 5.1 05/29/2014 1202   CL 98 03/24/2022 1053   CL 106 05/29/2014 1202   CO2 22 03/24/2022 1053   CO2 25 05/29/2014 1202   GLUCOSE 154 (H) 03/24/2022 1053   GLUCOSE 206 (H) 07/20/2020 1727   GLUCOSE 269 (H) 05/29/2014 1202   BUN 33 (H) 03/24/2022 1053   BUN 19 (H) 05/29/2014 1202   CREATININE 1.35 (H) 03/24/2022 1053   CREATININE 1.10 05/29/2014 1202   CALCIUM 9.6 03/24/2022 1053   CALCIUM 9.1 05/29/2014 1202   GFRNONAA 35 08/19/2021 0000   GFRNONAA 48 (L) 05/29/2014 1202   GFRAA 39 (L) 07/20/2020 1727  GFRAA 56 (L) 05/29/2014  1202   CrCl cannot be calculated (Patient's most recent lab result is older than the maximum 21 days allowed.).  COAG Lab Results  Component Value Date   INR 1.1 07/20/2020   INR 1.2 02/20/2020   INR 1.1 12/25/2019    Radiology No results found.   Assessment/Plan 1. Bilateral carotid artery stenosis Recommend:   Given the patient's asymptomatic subcritical stenosis no further invasive testing or surgery at this time.   Duplex ultrasound shows 40-59% stenosis bilaterally.   Continue antiplatelet therapy as prescribed Continue management of CAD, HTN and Hyperlipidemia Healthy heart diet,  encouraged exercise at least 4 times per week Follow up in 6 months with duplex ultrasound and physical exam  - VAS US CAROTID; Future  2. Atherosclerosis of native artery of left lower extremity with intermittent claudication (HCC) Recommend:   The patient has evidence of atherosclerosis of the lower extremities with claudication.  The patient does not voice lifestyle limiting changes at this point in time.   Noninvasive studies do not suggest clinically significant change.   No invasive studies, angiography or surgery at this time The patient should continue walking and begin a more formal exercise program.  The patient should continue antiplatelet therapy and aggressive treatment of the lipid abnormalities   No changes in the patient's medications at this time   The patient should continue wearing graduated compression socks 10-15 mmHg strength to control the mild edema.  - VAS [place] ABI WITH/WO TBI; Future  3. Arteriosclerosis of coronary artery Continue cardiac and antihypertensive medications as already ordered and reviewed, no changes at this time.  Continue statin as ordered and reviewed, no changes at this time  Nitrates PRN for chest pain   4. AF (paroxysmal atrial fibrillation) (HCC) Continue antiarrhythmia medications as already ordered, these medications have been  reviewed and there are no changes at this time.  Continue anticoagulation as ordered by Cardiology Service   5. Essential hypertension Continue antihypertensive medications as already ordered, these medications have been reviewed and there are no changes at this time.     Hortencia Pilar, MD  06/04/2022 2:31 PM

## 2022-06-05 ENCOUNTER — Encounter (INDEPENDENT_AMBULATORY_CARE_PROVIDER_SITE_OTHER): Payer: Self-pay | Admitting: Vascular Surgery

## 2022-06-15 DIAGNOSIS — E1165 Type 2 diabetes mellitus with hyperglycemia: Secondary | ICD-10-CM | POA: Diagnosis not present

## 2022-06-15 DIAGNOSIS — E1169 Type 2 diabetes mellitus with other specified complication: Secondary | ICD-10-CM | POA: Diagnosis not present

## 2022-06-15 DIAGNOSIS — Z794 Long term (current) use of insulin: Secondary | ICD-10-CM | POA: Diagnosis not present

## 2022-06-15 DIAGNOSIS — E1142 Type 2 diabetes mellitus with diabetic polyneuropathy: Secondary | ICD-10-CM | POA: Diagnosis not present

## 2022-06-30 ENCOUNTER — Ambulatory Visit: Payer: Self-pay

## 2022-06-30 NOTE — Patient Outreach (Signed)
Care Coordination   Follow Up Visit Note   06/30/2022 Name: LYNE KHURANA MRN: [id number] DOB: [birth year]  IBETH FAHMY is a 87 y.o. year old female who sees [name], MD for primary care. I spoke with  [name] by phone today.  What matters to the patients health and wellness today?  Blood sugars and the loss of her husband, Kermit    Goals Addressed             This Visit's Progress    RNCM: 'Kermit passed away a few weeks ago"       Care Coordination Interventions: Evaluation of current treatment plan related to the grief process and the loss of her husband of almost 65 years on 04-18-2022 and patient's adherence to plan as established by provider Advised patient to call the office or RNCM for unresolved grief, changes in her mood, anxiety, depression, or other needs related to the grief process and the passing of her husband Discussed plans with patient for ongoing care management follow up and provided patient with direct contact information for care management team Advised patient to discuss changes in her mental health and well being with provider Screening for signs and symptoms of depression related to chronic disease state  Assessed social determinant of health barriers  Active listening / Reflection utilized  Emotional Support Provided      RNCM: Effective Management of DM       Care Coordination Interventions: Lab Results  Component Value Date   HGBA1C 7.4 (H) 03/24/2022    Provided education to patient about basic DM disease process. The patient states her blood sugars are more stable now. She is only taking 7 units of Lantus. She states that she is not having low readings like recently Reviewed medications with patient and discussed importance of medication adherence. Is compliant with medications. Feels the Jardiance has caused weight loss but she is maintaining at 111 Counseled on importance of regular laboratory monitoring as prescribed. The patient  has regular lab work.  Discussed plans with patient for ongoing care management follow up and provided patient with direct contact information for care management team Provided patient with written educational materials related to hypo and hyperglycemia and importance of correct treatment. Review and education. The patient states she has a continuous reader. Sarah calls the specialist when she has changes in her readings that are low or very high Reviewed scheduled/upcoming provider appointments including: 09-22-2022 with pcp, sees specialist on a regular basis Advised patient, providing education and rationale, to check cbg QID and record, calling pcp or endocrinologist  for findings outside established parameters. Continuous reader.  Review of patient status, including review of consultants reports, relevant laboratory and other test results, and medications completed Screening for signs and symptoms of depression related to chronic disease state  Assessed social determinant of health barriers. Review of new electric and water/utilities question The patient was coming back from the dentist. Her partial that she had for 20 years fell out last night. She is going to have to have teeth pulled. States she can still eat and that she is drinking Ensure.          SDOH assessments and interventions completed:  Yes  SDOH Interventions Today    Flowsheet Row Most Recent Value  SDOH Interventions   Utilities Interventions Intervention Not Indicated        Care Coordination Interventions Activated:  Yes  Care Coordination Interventions:  Yes, provided   Follow  up plan: Follow up call scheduled for 09-29-2022 at 1 pm    Encounter Outcome:  Pt. Visit Completed   Noreene Larsson RN, MSN, [facility] Mobile: [phone]

## 2022-06-30 NOTE — Patient Instructions (Signed)
Visit Information  Thank you for taking time to visit with me today. Please don't hesitate to contact me if I can be of assistance to you.   Following are the goals we discussed today:   Goals Addressed             This Visit's Progress    RNCM: 'Kermit passed away a few weeks ago"       Care Coordination Interventions: Evaluation of current treatment plan related to the grief process and the loss of her husband of almost 11 years on 04-18-2022 and patient's adherence to plan as established by provider Advised patient to call the office or RNCM for unresolved grief, changes in her mood, anxiety, depression, or other needs related to the grief process and the passing of her husband Discussed plans with patient for ongoing care management follow up and provided patient with direct contact information for care management team Advised patient to discuss changes in her mental health and well being with provider Screening for signs and symptoms of depression related to chronic disease state  Assessed social determinant of health barriers  Active listening / Reflection utilized  Emotional Support Provided      RNCM: Effective Management of DM       Care Coordination Interventions: Lab Results  Component Value Date   HGBA1C 7.4 (H) 03/24/2022    Provided education to patient about basic DM disease process. The patient states her blood sugars are more stable now. She is only taking 7 units of Lantus. She states that she is not having low readings like recently Reviewed medications with patient and discussed importance of medication adherence. Is compliant with medications. Feels the Jardiance has caused weight loss but she is maintaining at 111 Counseled on importance of regular laboratory monitoring as prescribed. The patient has regular lab work.  Discussed plans with patient for ongoing care management follow up and provided patient with direct contact information for care management  team Provided patient with written educational materials related to hypo and hyperglycemia and importance of correct treatment. Review and education. The patient states she has a continuous reader. Sarah calls the specialist when she has changes in her readings that are low or very high Reviewed scheduled/upcoming provider appointments including: 09-22-2022 with pcp, sees specialist on a regular basis Advised patient, providing education and rationale, to check cbg QID and record, calling pcp or endocrinologist  for findings outside established parameters. Continuous reader.  Review of patient status, including review of consultants reports, relevant laboratory and other test results, and medications completed Screening for signs and symptoms of depression related to chronic disease state  Assessed social determinant of health barriers. Review of new electric and water/utilities question The patient was coming back from the dentist. Her partial that she had for 20 years fell out last night. She is going to have to have teeth pulled. States she can still eat and that she is drinking Ensure.          Our next appointment is by telephone on 09-29-2022 at 1 pm  Please call the care guide team at [phone] if you need to cancel or reschedule your appointment.   If you are experiencing a Mental Health or [place] or need someone to talk to, please call the Suicide and Crisis Lifeline: 988 call the [place] National Suicide Prevention Lifeline: 747-604-6736 or TTY: 629-248-7631 TTY (626) 494-3484) to talk to a trained counselor call 1-800-273-TALK (toll free, 24 hour hotline)  Patient verbalizes understanding  of instructions and care plan provided today and agrees to view in [place]. Active MyChart status and patient understanding of how to access instructions and care plan via MyChart confirmed with patient.     Telephone follow up appointment with care management team member  scheduled for: 09-29-2022 at 1 pm  [place], MSN, [facility] Mobile: [phone]

## 2022-07-06 ENCOUNTER — Encounter: Payer: Self-pay | Admitting: Internal Medicine

## 2022-07-06 ENCOUNTER — Ambulatory Visit (INDEPENDENT_AMBULATORY_CARE_PROVIDER_SITE_OTHER): Payer: Medicare HMO | Admitting: Internal Medicine

## 2022-07-06 VITALS — BP 108/40 | HR 64 | Ht 61.0 in | Wt 109.6 lb

## 2022-07-06 DIAGNOSIS — E1122 Type 2 diabetes mellitus with diabetic chronic kidney disease: Secondary | ICD-10-CM

## 2022-07-06 DIAGNOSIS — K089 Disorder of teeth and supporting structures, unspecified: Secondary | ICD-10-CM

## 2022-07-06 DIAGNOSIS — I5032 Chronic diastolic (congestive) heart failure: Secondary | ICD-10-CM

## 2022-07-06 DIAGNOSIS — D6869 Other thrombophilia: Secondary | ICD-10-CM | POA: Diagnosis not present

## 2022-07-06 DIAGNOSIS — N184 Chronic kidney disease, stage 4 (severe): Secondary | ICD-10-CM | POA: Diagnosis not present

## 2022-07-06 DIAGNOSIS — J0100 Acute maxillary sinusitis, unspecified: Secondary | ICD-10-CM | POA: Diagnosis not present

## 2022-07-06 DIAGNOSIS — E441 Mild protein-calorie malnutrition: Secondary | ICD-10-CM | POA: Diagnosis not present

## 2022-07-06 DIAGNOSIS — E113553 Type 2 diabetes mellitus with stable proliferative diabetic retinopathy, bilateral: Secondary | ICD-10-CM | POA: Diagnosis not present

## 2022-07-06 DIAGNOSIS — J441 Chronic obstructive pulmonary disease with (acute) exacerbation: Secondary | ICD-10-CM

## 2022-07-06 DIAGNOSIS — Z794 Long term (current) use of insulin: Secondary | ICD-10-CM

## 2022-07-06 MED ORDER — ALBUTEROL SULFATE HFA 108 (90 BASE) MCG/ACT IN AERS: 2.0000 | INHALATION_SPRAY | Freq: Four times a day (QID) | RESPIRATORY_TRACT | 0 refills | Status: DC | PRN

## 2022-07-06 MED ORDER — CEFDINIR 300 MG PO CAPS: 300.0000 mg | ORAL_CAPSULE | Freq: Two times a day (BID) | ORAL | 0 refills | Status: DC

## 2022-07-06 NOTE — Progress Notes (Signed)
Date:  07/06/2022   Name:  Jill Hudson   DOB:  10/16/1935   MRN:  [id number]   Chief Complaint: Consult (Partial fell out, and patient will be getting a temporary denture on 09/21. Dentist wants to pull rest of teeth and put in dentures. ) and Cough (Have COPD- Coughing a lot more. Bringing up mucous and swallows it back down so unsure of color. Sinus pressure. Started 2 weeks. No fever.)  Sinus Problem This is a new problem. The current episode started in the past 7 days. There has been no fever. Associated symptoms include congestion, coughing, shortness of breath, sinus pressure and a sore throat. Pertinent negatives include no chills or headaches. Past treatments include nothing.  Cough This is a recurrent problem. The current episode started 1 to 4 weeks ago. The problem has been unchanged. The problem occurs every few minutes. The cough is Productive of sputum. Associated symptoms include nasal congestion, a sore throat, shortness of breath and weight loss. Pertinent negatives include no chest pain, chills, fever, headaches, postnasal drip or wheezing.  Dental Pain  This is a new problem. Episode onset: old partial came out due to tooth decay - needs all teeth pulled and dentures but unsure about this in view of overall health. Associated symptoms include sinus pressure. Pertinent negatives include no fever.    Lab Results  Component Value Date   NA 137 03/24/2022   K 4.2 03/24/2022   CO2 22 03/24/2022   GLUCOSE 154 (H) 03/24/2022   BUN 33 (H) 03/24/2022   CREATININE 1.35 (H) 03/24/2022   CALCIUM 9.6 03/24/2022   EGFR 39 (L) 03/24/2022   GFRNONAA 35 08/19/2021   Lab Results  Component Value Date   CHOL 179 03/24/2022   HDL 48 03/24/2022   LDLCALC 98 03/24/2022   TRIG 191 (H) 03/24/2022   CHOLHDL 3.7 03/24/2022   Lab Results  Component Value Date   TSH 5.490 (H) 03/24/2022   Lab Results  Component Value Date   HGBA1C 7.4 (H) 03/24/2022   Lab Results  Component  Value Date   WBC 7.9 03/24/2022   HGB 11.2 03/24/2022   HCT 33.0 (L) 03/24/2022   MCV 91 03/24/2022   PLT 273 03/24/2022   Lab Results  Component Value Date   ALT 19 03/24/2022   AST 21 03/24/2022   ALKPHOS 96 03/24/2022   BILITOT 0.3 03/24/2022   No results found for: "25OHVITD2", "25OHVITD3", "VD25OH"   Review of Systems  Constitutional:  Positive for weight loss. Negative for chills and fever.  HENT:  Positive for congestion, dental problem, sinus pressure and sore throat. Negative for postnasal drip.   Respiratory:  Positive for cough and shortness of breath. Negative for wheezing.   Cardiovascular:  Negative for chest pain.  Gastrointestinal:  Positive for diarrhea (intermittently).  Neurological:  Negative for headaches.  Psychiatric/Behavioral:  Negative for dysphoric mood and sleep disturbance. The patient is not nervous/anxious.     Patient Active Problem List   Diagnosis Date Noted   Acquired thrombophilia ([place]) 03/20/2021   Mood disorder ([place]) 05/02/2020   Chronic heart failure with preserved ejection fraction ([place]) 04/12/2020   Diverticulosis of large intestine without perforation or abscess with bleeding 03/27/2020   AF (paroxysmal atrial fibrillation) (HCC)    COPD (chronic obstructive pulmonary disease) with chronic bronchitis ([place])    CKD (chronic kidney disease), stage IIIa 02/18/2020   Malnutrition of mild degree ([place]) 01/08/2020   Age-related macular degeneration, dry, left  eye 06/21/2019   Age-related macular degeneration, wet, right eye ([place]) 06/21/2019   Moderate nonproliferative diabetic retinopathy associated with type 2 diabetes mellitus ([place]) 06/21/2019   Lumbosacral radiculopathy at L4 05/01/2019   Underweight 05/01/2019   Encounter for long-term (current) use of aspirin 10/31/2018   Long term current use of oral hypoglycemic drug 10/31/2018   Encounter for long-term (current) use of insulin ([place]) 10/31/2018   Peptic ulcer disease 08/11/2018    Leg pain 07/03/2017   Carpal tunnel syndrome on both sides 05/05/2017   Myalgia due to HMG CoA reductase inhibitor 05/05/2017   History of CVA (cerebrovascular accident) 02/15/2017   Degenerative disc disease, lumbar 12/21/2016   Atherosclerosis of native arteries of extremity with intermittent claudication ([place]) 12/20/2016   Bilateral carotid artery stenosis 12/20/2016   Occlusion and stenosis of bilateral carotid arteries 12/20/2016   Hip bursitis 05/18/2016   Elevated TSH 01/18/2016   CKD stage 3 due to type 2 diabetes mellitus ([place]) 01/16/2016   Senile ecchymosis 01/16/2016   Type II diabetes mellitus with complication (HCC)    GERD (gastroesophageal reflux disease) 07/24/2015   PVD (peripheral vascular disease) ([place]) 05/17/2015   Neoplasm of uncertain behavior of skin 05/17/2015   Retinopathy, diabetic, proliferative ([place]) 04/11/2015   Hyperlipidemia associated with type 2 diabetes mellitus ([place]) 04/11/2015   Essential hypertension 04/11/2015   Generalized OA 04/11/2015   Proliferative diabetic retinopathy(362.02) 04/11/2015   Arteriosclerosis of coronary artery 05/29/2013   Hypertensive heart disease without CHF 05/29/2013    Allergies  Allergen Reactions   Saxagliptin Diarrhea   Epinephrine Other (See Comments)    Patient does not remember what happens when she uses this   Atorvastatin Other (See Comments)    Muscle aches   Codeine Other (See Comments)    Upset stomach   Ezetimibe Other (See Comments)    Myalgias(ZETIA)   Limonene Rash    Patient does not recall this reaction   Nitrofurantoin Rash and Other (See Comments)    Pruitus   Sulfa Antibiotics Rash and Other (See Comments)    Sore mouth     Past Surgical History:  Procedure Laterality Date   CARDIAC CATHETERIZATION  1998   40% LM, 95% Ramus interm   CATARACT EXTRACTION, BILATERAL     COLONOSCOPY WITH PROPOFOL N/A 08/12/2018   Procedure: COLONOSCOPY WITH PROPOFOL;  Surgeon: Lucilla Lame, MD;  Location:  [facility] ENDOSCOPY;  Service: Endoscopy;  Laterality: N/A;   ENDARTERECTOMY FEMORAL Left 10/12/2018   Procedure: ENDARTERECTOMY FEMORAL;  Surgeon: Katha Cabal, MD;  Location: [facility] ORS;  Service: Vascular;  Laterality: Left;  angioplasty and left SFA stent placement   EYE SURGERY Bilateral    cataract extractions   LOWER EXTREMITY ANGIOGRAPHY Left 08/23/2017   Procedure: Lower Extremity Angiography;  Surgeon: Algernon Huxley, MD;  Location: [facility] CV LAB;  Service: Cardiovascular;  Laterality: Left;   LOWER EXTREMITY ANGIOGRAPHY Left 07/26/2018   Procedure: LOWER EXTREMITY ANGIOGRAPHY;  Surgeon: Katha Cabal, MD;  Location: [place] [facility] CV LAB;  Service: Cardiovascular;  Laterality: Left;   LOWER EXTREMITY ANGIOGRAPHY Left 09/16/2018   Procedure: LOWER EXTREMITY ANGIOGRAPHY;  Surgeon: Katha Cabal, MD;  Location: [place] CV LAB;  Service: Cardiovascular;  Laterality: Left;   PTCA  08/2013   Left common iliac   PTCA  12/2012   left ext iliac   RIGHT HEART CATH N/A 02/23/2020   Procedure: RIGHT HEART CATH;  Surgeon: Minna Merritts, MD;  Location: [facility] CV LAB;  Service: Cardiovascular;  Laterality: N/A;   TUBAL LIGATION      Social History   Tobacco Use   Smoking status: Former    Packs/day: 2.00    Years: 37.00    Total pack years: 74.00    Types: Cigarettes    Quit date: 1980    Years since quitting: 43.7   Smokeless tobacco: Never   Tobacco comments:    smoking cessation materials not required  Vaping Use   Vaping Use: Never used  Substance Use Topics   Alcohol use: No    Alcohol/week: 0.0 standard drinks of alcohol   Drug use: No     Medication list has been reviewed and updated.  Current Meds  Medication Sig   acetaminophen (TYLENOL) 650 MG CR tablet Take 1,300 mg by mouth every 8 (eight) hours as needed for pain.   albuterol (VENTOLIN HFA) 108 (90 Base) MCG/ACT inhaler Inhale 2 puffs into the lungs every 6 (six) hours as needed for  wheezing or shortness of breath.   amLODipine (NORVASC) 5 MG tablet Take 5 mg by mouth in the morning and at bedtime.   apixaban (ELIQUIS) 2.5 MG TABS tablet TAKE 1 TABLET(2.5 MG) BY MOUTH TWICE DAILY   cefdinir (OMNICEF) 300 MG capsule Take 1 capsule (300 mg total) by mouth 2 (two) times daily for 10 days.   cloNIDine (CATAPRES) 0.1 MG tablet Take 0.1 mg by mouth 2 (two) times daily as needed.   Cyanocobalamin 1000 MCG TBCR Take 1,000 mcg by mouth daily.   feeding supplement, GLUCERNA SHAKE, (GLUCERNA SHAKE) LIQD Take 237 mLs by mouth 3 (three) times daily between meals.   ferrous sulfate 325 (65 FE) MG tablet Take 325 mg by mouth daily with breakfast.   gabapentin (NEURONTIN) 100 MG capsule Take 1 capsule (100 mg total) by mouth at bedtime.   insulin glargine (LANTUS) 100 UNIT/ML injection Inject 0.05 mLs (5 Units total) into the skin daily.   Insulin Pen Needle (B-D ULTRAFINE III SHORT PEN) 31G X 8 MM MISC USE AS DIRECTED   JARDIANCE 10 MG TABS tablet Take 10 mg by mouth daily.   losartan (COZAAR) 25 MG tablet Take 25 mg by mouth 2 (two) times daily. Unsure of dosage   melatonin 5 MG TABS Take 0.5 tablets (2.5 mg total) by mouth at bedtime as needed (sleep).   Multiple Vitamins-Minerals (PRESERVISION AREDS PO) Take 1 capsule by mouth 2 (two) times daily.    promethazine-dextromethorphan (PROMETHAZINE-DM) 6.25-15 MG/5ML syrup TAKE 5 ML BY MOUTH FOUR TIMES DAILY AS NEEDED FOR COUGH   REPATHA PUSHTRONEX SYSTEM 420 MG/3.5ML SOCT SMARTSIG:420 Milligram(s) SUB-Q Once a Month   torsemide (DEMADEX) 20 MG tablet Take 2 tablets (40 mg total) by mouth daily. (Patient taking differently: Take 40 mg by mouth 2 (two) times daily. 1/2 tablet morning and 1/2 tablet at night.)       07/06/2022    2:15 PM 03/24/2022   10:17 AM 02/23/2022    3:13 PM 11/06/2021   10:50 AM  GAD 7 : Generalized Anxiety Score  Nervous, Anxious, on Edge 2 0 1 2  Control/stop worrying 1 0 1 3  Worry too much - different things 1 0  1 3  Trouble relaxing 2 0 1 0  Restless 1 0 1 0  Easily annoyed or irritable '1 1 1 3  ' Afraid - awful might happen 0 0 0 3  Total GAD 7 Score '8 1 6 14  ' Anxiety Difficulty Somewhat difficult Not difficult  at all Somewhat difficult Somewhat difficult       07/06/2022    2:14 PM 03/24/2022   10:17 AM 03/11/2022    1:18 PM  Depression screen PHQ 2/9  Decreased Interest 2 2 0  Down, Depressed, Hopeless 3 2 0  PHQ - 2 Score 5 4 0  Altered sleeping 3 2 0  Tired, decreased energy '3 3 1  ' Change in appetite 1 0 1  Feeling bad or failure about yourself  3 0 1  Trouble concentrating 1 0 1  Moving slowly or fidgety/restless 1 0 0  Suicidal thoughts 1 0 0  PHQ-9 Score '18 9 4  ' Difficult doing work/chores Somewhat difficult Not difficult at all Not difficult at all    BP Readings from Last 3 Encounters:  07/06/22 (!) 108/40  06/04/22 (!) 150/72  03/24/22 (!) 110/50    Physical Exam Vitals and nursing note reviewed.  Constitutional:      General: She is not in acute distress.    Appearance: Normal appearance. She is well-developed.  HENT:     Head: Normocephalic and atraumatic.     Mouth/Throat:     Dentition: Abnormal dentition (all upper teeth broken at the gum line).  Cardiovascular:     Rate and Rhythm: Normal rate and regular rhythm.  Pulmonary:     Effort: Pulmonary effort is normal. No respiratory distress.     Breath sounds: No stridor. Rhonchi present. No wheezing.  Musculoskeletal:     Cervical back: Normal range of motion.     Right lower leg: No edema.     Left lower leg: No edema.  Lymphadenopathy:     Cervical: No cervical adenopathy.  Skin:    General: Skin is warm and dry.     Capillary Refill: Capillary refill takes less than 2 seconds.     Findings: No rash.  Neurological:     Mental Status: She is alert and oriented to person, place, and time.  Psychiatric:        Mood and Affect: Mood normal.        Behavior: Behavior normal.     Wt Readings from Last  3 Encounters:  07/06/22 109 lb 9.6 oz (49.7 kg)  03/24/22 107 lb (48.5 kg)  02/23/22 107 lb (48.5 kg)    BP (!) 108/40 (BP Location: Right Arm, Cuff Size: Normal)   Pulse 64   Ht '5\' 1"'  (1.549 m)   Wt 109 lb 9.6 oz (49.7 kg)   SpO2 96%   BMI 20.71 kg/m   Assessment and Plan: 1. COPD with exacerbation (HCC) Recommend albuterol 2-3 times per day Cefdinir to cover infection - albuterol (VENTOLIN HFA) 108 (90 Base) MCG/ACT inhaler; Inhale 2 puffs into the lungs every 6 (six) hours as needed for wheezing or shortness of breath.  Dispense: 1 each; Refill: 0 - cefdinir (OMNICEF) 300 MG capsule; Take 1 capsule (300 mg total) by mouth 2 (two) times daily for 10 days.  Dispense: 20 capsule; Refill: 0  2. Acute non-recurrent maxillary sinusitis Mild symptoms should be covered - cefdinir (OMNICEF) 300 MG capsule; Take 1 capsule (300 mg total) by mouth 2 (two) times daily for 10 days.  Dispense: 20 capsule; Refill: 0  3. Poor dentition She should pursue treatment but will need to hold Eliquis for 5 days before and 2 days after Also needs the Cardiologist to okay this plan  4. Malnutrition of mild degree (HCC) Continue to encourage regular meals Nutritional supplements daily  5. Chronic heart failure with preserved ejection fraction ([place])  6. Stable proliferative diabetic retinopathy of both eyes associated with type 2 diabetes mellitus ([place]) Followed by Ophthalmology  7. Acquired thrombophilia ([place]) Due to Eliquis  8. Type 2 diabetes mellitus with stage 4 chronic kidney disease, with long-term current use of insulin (HCC) Followed by Endo; on Jardiance and Lantus BS controlled with the aid of Dexcom.   Partially dictated using Editor, commissioning. Any errors are unintentional.  [name], MD [facility]  07/06/2022

## 2022-07-09 ENCOUNTER — Other Ambulatory Visit: Payer: Self-pay | Admitting: Internal Medicine

## 2022-07-09 ENCOUNTER — Ambulatory Visit: Payer: Self-pay

## 2022-07-09 DIAGNOSIS — J441 Chronic obstructive pulmonary disease with (acute) exacerbation: Secondary | ICD-10-CM

## 2022-07-09 DIAGNOSIS — J0100 Acute maxillary sinusitis, unspecified: Secondary | ICD-10-CM

## 2022-07-09 MED ORDER — DOXYCYCLINE HYCLATE 100 MG PO TABS: 100.0000 mg | ORAL_TABLET | Freq: Two times a day (BID) | ORAL | 0 refills | Status: AC

## 2022-07-09 NOTE — Telephone Encounter (Signed)
Patient daughter Judson Roch informed.

## 2022-07-09 NOTE — Telephone Encounter (Signed)
Chief Complaint: Diarrhea Symptoms: 3-4 bouts of loose watery stool Frequency: yesterday Pertinent Negatives: Patient denies fever Disposition: '[]'$ ED /'[]'$ Urgent Care (no appt availability in office) / '[]'$ Appointment(In office/virtual)/ '[]'$  [facility]/ '[]'$ Home Care/ '[]'$ Refused Recommended Disposition /'[]'$ [facility]/ '[x]'$  Follow-up with PCP Additional Notes: Spoke with pt's daughter Judson Roch. Pt has had 3-4 bouts of diarrhea since starting antibiotic. Pt has discontinued antibiotic. Pt and daughter would like a new antibiotic called in.    Summary: Diarrhea from [place], the daughter of the patient called in stating that her mother has been having really bad diarrhea while taking this new antibiotic cefdinir (OMNICEF) 300 MG capsule. She has asked if there is anything else her mother can take that is less likely to cause her diarrhea. The patient uses   [facility] [place] [address] AT [place]  Phone: [phone]  Fax: [phone]    Please assist patient further      Reason for Disposition  [1] MILD diarrhea AND [2] taking antibiotics  Answer Assessment - Initial Assessment Questions 1. ANTIBIOTIC: "What antibiotic are you taking?" "How many times per day?"     OMNICEF 2. ANTIBIOTIC ONSET: "When was the antibiotic started?"     3 doses 3. DIARRHEA SEVERITY: "How bad is the diarrhea?" "How many more stools have you had in the past 24 hours than normal?"    - NO DIARRHEA (SCALE 0)   - MILD (SCALE 1-3): Few loose or mushy BMs; increase of 1-3 stools over normal daily number of stools; mild increase in ostomy output.   -  MODERATE (SCALE 4-7): Increase of 4-6 stools daily over normal; moderate increase in ostomy output. * SEVERE (SCALE 8-10; OR 'WORST POSSIBLE'): Increase of 7 or more stools daily over normal; moderate increase in ostomy output; incontinence.     3 times 4. ONSET: "When did the diarrhea  begin?"      yesterday 5. BM CONSISTENCY: "How loose or watery is the diarrhea?"      Loose and watery 6. VOMITING: "Are you also vomiting?" If Yes, ask: "How many times in the past 24 hours?"      no 7. ABDOMEN PAIN: "Are you having any abdomen pain?" If Yes, ask: "What does it feel like?" (e.g., crampy, dull, intermittent, constant)      unknown 8. ABDOMEN PAIN SEVERITY: If present, ask: "How bad is the pain?"  (e.g., Scale 1-10; mild, moderate, or severe)   - MILD (1-3): doesn't interfere with normal activities, abdomen soft and not tender to touch    - MODERATE (4-7): interferes with normal activities or awakens from sleep, abdomen tender to touch    - SEVERE (8-10): excruciating pain, doubled over, unable to do any normal activities        9. ORAL INTAKE: If vomiting, "Have you been able to drink liquids?" "How much liquids have you had in the past 24 hours?"     yes 10. HYDRATION: "Any signs of dehydration?" (e.g., dry mouth [not just dry lips], too weak to stand, dizziness, new weight loss) "When did you last urinate?"       no 11. EXPOSURE: "Have you traveled to a foreign country recently?" "Have you been exposed to anyone with diarrhea?" "Could you have eaten any food that was spoiled?"        12. OTHER SYMPTOMS: "Do you have any other symptoms?" (e.g., fever, blood in stool)  unknown 13. PREGNANCY: "Is there any chance you are pregnant?" "When was your last menstrual period?"  Protocols used: Diarrhea on Antibiotics-A-AH

## 2022-07-14 ENCOUNTER — Other Ambulatory Visit: Payer: Self-pay | Admitting: Internal Medicine

## 2022-07-14 DIAGNOSIS — J449 Chronic obstructive pulmonary disease, unspecified: Secondary | ICD-10-CM

## 2022-07-14 DIAGNOSIS — H353211 Exudative age-related macular degeneration, right eye, with active choroidal neovascularization: Secondary | ICD-10-CM | POA: Diagnosis not present

## 2022-07-15 DIAGNOSIS — E1142 Type 2 diabetes mellitus with diabetic polyneuropathy: Secondary | ICD-10-CM | POA: Diagnosis not present

## 2022-07-15 DIAGNOSIS — E1165 Type 2 diabetes mellitus with hyperglycemia: Secondary | ICD-10-CM | POA: Diagnosis not present

## 2022-07-15 DIAGNOSIS — E1169 Type 2 diabetes mellitus with other specified complication: Secondary | ICD-10-CM | POA: Diagnosis not present

## 2022-07-15 DIAGNOSIS — Z794 Long term (current) use of insulin: Secondary | ICD-10-CM | POA: Diagnosis not present

## 2022-07-15 NOTE — Telephone Encounter (Signed)
Requested Prescriptions  Pending Prescriptions Disp Refills  . promethazine-dextromethorphan (PROMETHAZINE-DM) 6.25-15 MG/5ML syrup [Pharmacy Med Name: PROMETHAZINE DM SYRUP] 180 mL 0    Sig: TAKE 5 ML BY MOUTH FOUR TIMES DAILY AS NEEDED FOR COUGH     Ear, Nose, and Throat:  Antitussives/Expectorants Passed - 07/14/2022  1:51 PM      Passed - Valid encounter within last 12 months    Recent Outpatient Visits          1 week ago COPD with exacerbation [facility])   [place] [facility] at [facility], Jesse Sans, MD   3 months ago Annual physical exam   [place] [facility] at [facility], Jesse Sans, MD   4 months ago Unintentional weight loss   [facility] at [facility], Jesse Sans, MD   8 months ago Cellulitis of left lower extremity   [place] [facility] at [facility], Jesse Sans, MD   10 months ago Essential hypertension   [place] [facility] at [facility], Jesse Sans, MD      Future Appointments            In 2 months Army Melia, Jesse Sans, MD [place] [facility] at [facility], [facility]

## 2022-07-27 ENCOUNTER — Ambulatory Visit: Payer: Self-pay | Admitting: *Deleted

## 2022-07-27 DIAGNOSIS — F419 Anxiety disorder, unspecified: Secondary | ICD-10-CM | POA: Diagnosis not present

## 2022-07-27 DIAGNOSIS — I10 Essential (primary) hypertension: Secondary | ICD-10-CM | POA: Diagnosis not present

## 2022-07-27 DIAGNOSIS — J449 Chronic obstructive pulmonary disease, unspecified: Secondary | ICD-10-CM | POA: Diagnosis not present

## 2022-07-27 DIAGNOSIS — E119 Type 2 diabetes mellitus without complications: Secondary | ICD-10-CM | POA: Diagnosis not present

## 2022-07-27 DIAGNOSIS — R059 Cough, unspecified: Secondary | ICD-10-CM | POA: Diagnosis not present

## 2022-07-27 DIAGNOSIS — R531 Weakness: Secondary | ICD-10-CM | POA: Diagnosis not present

## 2022-07-27 DIAGNOSIS — Z888 Allergy status to other drugs, medicaments and biological substances status: Secondary | ICD-10-CM | POA: Diagnosis not present

## 2022-07-27 DIAGNOSIS — Z8673 Personal history of transient ischemic attack (TIA), and cerebral infarction without residual deficits: Secondary | ICD-10-CM | POA: Diagnosis not present

## 2022-07-27 DIAGNOSIS — R5383 Other fatigue: Secondary | ICD-10-CM | POA: Diagnosis not present

## 2022-07-27 DIAGNOSIS — N3000 Acute cystitis without hematuria: Secondary | ICD-10-CM | POA: Diagnosis not present

## 2022-07-27 DIAGNOSIS — Z885 Allergy status to narcotic agent status: Secondary | ICD-10-CM | POA: Diagnosis not present

## 2022-07-27 DIAGNOSIS — R1030 Lower abdominal pain, unspecified: Secondary | ICD-10-CM | POA: Diagnosis not present

## 2022-07-27 DIAGNOSIS — R1110 Vomiting, unspecified: Secondary | ICD-10-CM | POA: Diagnosis not present

## 2022-07-27 DIAGNOSIS — Z881 Allergy status to other antibiotic agents status: Secondary | ICD-10-CM | POA: Diagnosis not present

## 2022-07-27 NOTE — Telephone Encounter (Signed)
Agree to disposition. Noted. - Jill Hudson

## 2022-07-27 NOTE — Telephone Encounter (Addendum)
  Chief Complaint: dizziness, faint feeling, difficulty with speech, nausea    Has had several of these episodes Symptoms: Dizziness, nausea, feeling faint and speech difficulty while the episode is occuring Frequency: Started Sept 21, 2023 has had several episodes since.   Refused ED when EMS called. Pertinent Negatives: Patient denies passing out.    Has history of TIA 6-7 yrs ago Disposition: '[x]'$ ED /'[]'$ Urgent Care (no appt availability in office) / '[]'$ Appointment(In office/virtual)/ '[]'$  [facility]/ '[]'$ Home Care/ '[]'$ Refused Recommended Disposition /'[]'$ [facility]/ '[]'$  Follow-up with PCP Additional Notes: Spoke with daughter Judson Roch [place].   Her mother is in bed but she has spoken with her this morning and she is still feeling dizzy and having nausea this morning.    They were agreeable to going to the ED.   Preferred to go to [facility].   I let them know that was fine.  Sent my notes to Dr. Army Melia.  I called into the office and let them know about the ED referral.

## 2022-07-27 NOTE — Telephone Encounter (Signed)
Reason for Disposition  [1] Loss of speech or garbled speech AND [2] sudden onset AND [3] brief (now gone)  Answer Assessment - Initial Assessment Questions 1. SYMPTOM: "What is the main symptom you are concerned about?" (e.g., weakness, numbness)     Daughter calling in  Jill Hudson,   Pt is dizzy, feeling faint, nauseas this morning.    Started Sept 21, 2023 had the first episode.  She had some difficulty with her speech during these episodes.     We called EMS and they came out.   Pt refused to go to the ED because she wanted to go to a birthday party instead.   She had a dizzy spell at the birthday party.  She had a another dizzy spell and refused to go to the ED again "because she needed to get dentures before going to a funeral".  Has happened 2 more times including this morning. History of TIA 6-7 yrs ago.    She has COPD and hypertension.       2. ONSET: "When did this start?" (minutes, hours, days; while sleeping)     Sept. 21/2023     3. LAST NORMAL: "When was the last time you (the patient) were normal (no symptoms)?"    This morning dizzy also.    4. PATTERN "Does this come and go, or has it been constant since it started?"  "Is it present now?"     Intermittently since then. 5. CARDIAC SYMPTOMS: "Have you had any of the following symptoms: chest pain, difficulty breathing, palpitations?"     No 6. NEUROLOGIC SYMPTOMS: "Have you had any of the following symptoms: headache, dizziness, vision loss, double vision, changes in speech, unsteady on your feet?"     Dizziness, nausea and feeling faint. 7. OTHER SYMPTOMS: "Do you have any other symptoms?"     Weak feeling 8. PREGNANCY: "Is there any chance you are pregnant?" "When was your last menstrual period?"     N/A due to age  Protocols used: Neurologic Deficit-A-AH

## 2022-07-28 DIAGNOSIS — N3000 Acute cystitis without hematuria: Secondary | ICD-10-CM | POA: Diagnosis not present

## 2022-08-03 DIAGNOSIS — R8290 Unspecified abnormal findings in urine: Secondary | ICD-10-CM | POA: Diagnosis not present

## 2022-08-03 DIAGNOSIS — R600 Localized edema: Secondary | ICD-10-CM | POA: Diagnosis not present

## 2022-08-03 DIAGNOSIS — B351 Tinea unguium: Secondary | ICD-10-CM | POA: Diagnosis not present

## 2022-08-03 DIAGNOSIS — Z794 Long term (current) use of insulin: Secondary | ICD-10-CM | POA: Diagnosis not present

## 2022-08-03 DIAGNOSIS — N1832 Chronic kidney disease, stage 3b: Secondary | ICD-10-CM | POA: Diagnosis not present

## 2022-08-03 DIAGNOSIS — E1122 Type 2 diabetes mellitus with diabetic chronic kidney disease: Secondary | ICD-10-CM | POA: Diagnosis not present

## 2022-08-03 DIAGNOSIS — E875 Hyperkalemia: Secondary | ICD-10-CM | POA: Diagnosis not present

## 2022-08-03 DIAGNOSIS — E1142 Type 2 diabetes mellitus with diabetic polyneuropathy: Secondary | ICD-10-CM | POA: Diagnosis not present

## 2022-08-03 DIAGNOSIS — I10 Essential (primary) hypertension: Secondary | ICD-10-CM | POA: Diagnosis not present

## 2022-08-06 ENCOUNTER — Other Ambulatory Visit (INDEPENDENT_AMBULATORY_CARE_PROVIDER_SITE_OTHER): Payer: Medicare HMO | Admitting: Internal Medicine

## 2022-08-06 DIAGNOSIS — E1122 Type 2 diabetes mellitus with diabetic chronic kidney disease: Secondary | ICD-10-CM

## 2022-08-06 DIAGNOSIS — J4489 Other specified chronic obstructive pulmonary disease: Secondary | ICD-10-CM

## 2022-08-06 DIAGNOSIS — E782 Mixed hyperlipidemia: Secondary | ICD-10-CM

## 2022-08-06 DIAGNOSIS — I5032 Chronic diastolic (congestive) heart failure: Secondary | ICD-10-CM

## 2022-08-06 DIAGNOSIS — N3000 Acute cystitis without hematuria: Secondary | ICD-10-CM | POA: Diagnosis not present

## 2022-08-06 DIAGNOSIS — Z7901 Long term (current) use of anticoagulants: Secondary | ICD-10-CM

## 2022-08-06 DIAGNOSIS — M791 Myalgia, unspecified site: Secondary | ICD-10-CM

## 2022-08-06 DIAGNOSIS — T466X5A Adverse effect of antihyperlipidemic and antiarteriosclerotic drugs, initial encounter: Secondary | ICD-10-CM

## 2022-08-06 DIAGNOSIS — N183 Chronic kidney disease, stage 3 unspecified: Secondary | ICD-10-CM

## 2022-08-06 DIAGNOSIS — E119 Type 2 diabetes mellitus without complications: Secondary | ICD-10-CM

## 2022-08-06 DIAGNOSIS — Z7984 Long term (current) use of oral hypoglycemic drugs: Secondary | ICD-10-CM

## 2022-08-06 DIAGNOSIS — I480 Paroxysmal atrial fibrillation: Secondary | ICD-10-CM

## 2022-08-06 DIAGNOSIS — I10 Essential (primary) hypertension: Secondary | ICD-10-CM

## 2022-08-06 DIAGNOSIS — Z8673 Personal history of transient ischemic attack (TIA), and cerebral infarction without residual deficits: Secondary | ICD-10-CM

## 2022-08-06 DIAGNOSIS — G5603 Carpal tunnel syndrome, bilateral upper limbs: Secondary | ICD-10-CM

## 2022-08-06 DIAGNOSIS — I080 Rheumatic disorders of both mitral and aortic valves: Secondary | ICD-10-CM

## 2022-08-06 DIAGNOSIS — Z794 Long term (current) use of insulin: Secondary | ICD-10-CM

## 2022-08-06 NOTE — Progress Notes (Signed)
Received home health orders orders from [facility]. Start of care 07/30/22.   Certification and orders from 07/30/22 through 09/27/22 are reviewed, signed and faxed back to home health company.  Need of intermittent skilled services at home: homebound  The home health care plan has been established by me and will be reviewed and updated as needed to maximize patient recovery.  I certify that all home health services have been and will be furnished to the patient while under my care.  Face-to-face encounter in which the need for home health services was established: 07/27/22 at ED visit.  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

## 2022-08-10 DIAGNOSIS — N1832 Chronic kidney disease, stage 3b: Secondary | ICD-10-CM | POA: Diagnosis not present

## 2022-08-10 DIAGNOSIS — E1122 Type 2 diabetes mellitus with diabetic chronic kidney disease: Secondary | ICD-10-CM | POA: Diagnosis not present

## 2022-08-10 DIAGNOSIS — I10 Essential (primary) hypertension: Secondary | ICD-10-CM | POA: Diagnosis not present

## 2022-08-11 ENCOUNTER — Telehealth: Payer: Self-pay | Admitting: Internal Medicine

## 2022-08-11 NOTE — Telephone Encounter (Signed)
Called Jill Hudson gave her verbal orders. She verbalized understanding.  KP

## 2022-08-11 NOTE — Telephone Encounter (Signed)
Home Health Verbal Orders - Caller/Agency: Stephanie with [facility] Number: [phone] Requesting OT Frequency: 2 x wk for 1 wk, 1 x wk for 4 wks

## 2022-08-14 DIAGNOSIS — Z794 Long term (current) use of insulin: Secondary | ICD-10-CM | POA: Diagnosis not present

## 2022-08-14 DIAGNOSIS — E1165 Type 2 diabetes mellitus with hyperglycemia: Secondary | ICD-10-CM | POA: Diagnosis not present

## 2022-08-14 DIAGNOSIS — E1142 Type 2 diabetes mellitus with diabetic polyneuropathy: Secondary | ICD-10-CM | POA: Diagnosis not present

## 2022-08-14 DIAGNOSIS — E1169 Type 2 diabetes mellitus with other specified complication: Secondary | ICD-10-CM | POA: Diagnosis not present

## 2022-08-24 ENCOUNTER — Encounter (INDEPENDENT_AMBULATORY_CARE_PROVIDER_SITE_OTHER): Payer: Self-pay

## 2022-08-24 ENCOUNTER — Other Ambulatory Visit: Payer: Self-pay | Admitting: Internal Medicine

## 2022-08-24 DIAGNOSIS — H353211 Exudative age-related macular degeneration, right eye, with active choroidal neovascularization: Secondary | ICD-10-CM | POA: Diagnosis not present

## 2022-08-24 DIAGNOSIS — J4489 Other specified chronic obstructive pulmonary disease: Secondary | ICD-10-CM

## 2022-08-25 NOTE — Telephone Encounter (Signed)
Requested medication (s) are due for refill today: yes  Requested medication (s) are on the active medication list: yes  Last refill:  07/15/22  Future visit scheduled:yes  Notes to clinic:  Unable to refill per protocol, unsure if patient is still taking cough syrup, routing for review     Requested Prescriptions  Pending Prescriptions Disp Refills   promethazine-dextromethorphan (PROMETHAZINE-DM) 6.25-15 MG/5ML syrup [Pharmacy Med Name: PROMETHAZINE DM SYRUP] 180 mL 0    Sig: TAKE 5 ML BY MOUTH FOUR TIMES DAILY AS NEEDED FOR COUGH     Ear, Nose, and Throat:  Antitussives/Expectorants Passed - 08/24/2022 10:06 PM      Passed - Valid encounter within last 12 months    Recent Outpatient Visits           1 month ago COPD with exacerbation ([place])   [place] [facility] at [facility], Jesse Sans, MD   5 months ago Annual physical exam   [place] [facility] at [facility], Jesse Sans, MD   6 months ago Unintentional weight loss   [facility] at [facility], Jesse Sans, MD   9 months ago Cellulitis of left lower extremity   [place] [facility] at [facility], Jesse Sans, MD   11 months ago Essential hypertension   [place] [facility] at [facility], Jesse Sans, MD       Future Appointments             In 4 weeks [employer] [facility] Jesse Sans, MD [place] [facility] Medicine at [facility], [facility]

## 2022-09-04 DIAGNOSIS — E1142 Type 2 diabetes mellitus with diabetic polyneuropathy: Secondary | ICD-10-CM | POA: Diagnosis not present

## 2022-09-04 DIAGNOSIS — Z794 Long term (current) use of insulin: Secondary | ICD-10-CM | POA: Diagnosis not present

## 2022-09-04 DIAGNOSIS — I152 Hypertension secondary to endocrine disorders: Secondary | ICD-10-CM | POA: Diagnosis not present

## 2022-09-04 DIAGNOSIS — E785 Hyperlipidemia, unspecified: Secondary | ICD-10-CM | POA: Diagnosis not present

## 2022-09-04 DIAGNOSIS — E1169 Type 2 diabetes mellitus with other specified complication: Secondary | ICD-10-CM | POA: Diagnosis not present

## 2022-09-04 DIAGNOSIS — E1159 Type 2 diabetes mellitus with other circulatory complications: Secondary | ICD-10-CM | POA: Diagnosis not present

## 2022-09-04 LAB — HEMOGLOBIN A1C: Hemoglobin A1C: 7.1

## 2022-09-07 DIAGNOSIS — I2510 Atherosclerotic heart disease of native coronary artery without angina pectoris: Secondary | ICD-10-CM | POA: Diagnosis not present

## 2022-09-07 DIAGNOSIS — I5032 Chronic diastolic (congestive) heart failure: Secondary | ICD-10-CM | POA: Diagnosis not present

## 2022-09-07 DIAGNOSIS — I480 Paroxysmal atrial fibrillation: Secondary | ICD-10-CM | POA: Diagnosis not present

## 2022-09-15 DIAGNOSIS — E1165 Type 2 diabetes mellitus with hyperglycemia: Secondary | ICD-10-CM | POA: Diagnosis not present

## 2022-09-15 DIAGNOSIS — E1169 Type 2 diabetes mellitus with other specified complication: Secondary | ICD-10-CM | POA: Diagnosis not present

## 2022-09-15 DIAGNOSIS — E1142 Type 2 diabetes mellitus with diabetic polyneuropathy: Secondary | ICD-10-CM | POA: Diagnosis not present

## 2022-09-15 DIAGNOSIS — Z794 Long term (current) use of insulin: Secondary | ICD-10-CM | POA: Diagnosis not present

## 2022-09-16 ENCOUNTER — Ambulatory Visit (INDEPENDENT_AMBULATORY_CARE_PROVIDER_SITE_OTHER): Payer: Medicare HMO | Admitting: Internal Medicine

## 2022-09-16 ENCOUNTER — Encounter: Payer: Self-pay | Admitting: Internal Medicine

## 2022-09-16 DIAGNOSIS — J4489 Other specified chronic obstructive pulmonary disease: Secondary | ICD-10-CM | POA: Diagnosis not present

## 2022-09-16 MED ORDER — PROMETHAZINE-DM 6.25-15 MG/5ML PO SYRP: ORAL_SOLUTION | ORAL | 0 refills | Status: DC

## 2022-09-16 NOTE — Progress Notes (Signed)
Date:  09/16/2022   Name:  Jill Hudson   DOB:  1935/03/06   MRN:  [id number]   Chief Complaint: Cough and COPD  Cough This is a recurrent problem. Episode onset: X3 days. The problem has been gradually worsening. The problem occurs hourly. The cough is Productive of sputum. Associated symptoms include postnasal drip and wheezing. Pertinent negatives include no chest pain, chills, fever or sore throat. Treatments tried: cough medication. The treatment provided mild relief. Her past medical history is significant for COPD.    Lab Results  Component Value Date   NA 137 03/24/2022   K 4.2 03/24/2022   CO2 22 03/24/2022   GLUCOSE 154 (H) 03/24/2022   BUN 33 (H) 03/24/2022   CREATININE 1.35 (H) 03/24/2022   CALCIUM 9.6 03/24/2022   EGFR 39 (L) 03/24/2022   GFRNONAA 35 08/19/2021   Lab Results  Component Value Date   CHOL 179 03/24/2022   HDL 48 03/24/2022   LDLCALC 98 03/24/2022   TRIG 191 (H) 03/24/2022   CHOLHDL 3.7 03/24/2022   Lab Results  Component Value Date   TSH 5.490 (H) 03/24/2022   Lab Results  Component Value Date   HGBA1C 7.4 (H) 03/24/2022   Lab Results  Component Value Date   WBC 7.9 03/24/2022   HGB 11.2 03/24/2022   HCT 33.0 (L) 03/24/2022   MCV 91 03/24/2022   PLT 273 03/24/2022   Lab Results  Component Value Date   ALT 19 03/24/2022   AST 21 03/24/2022   ALKPHOS 96 03/24/2022   BILITOT 0.3 03/24/2022   No results found for: "25OHVITD2", "25OHVITD3", "VD25OH"   Review of Systems  Constitutional:  Negative for chills, fatigue, fever and unexpected weight change.  HENT:  Positive for postnasal drip. Negative for congestion, sinus pressure, sore throat, tinnitus and trouble swallowing.   Respiratory:  Positive for cough and wheezing.   Cardiovascular:  Negative for chest pain and palpitations.  Gastrointestinal:  Negative for constipation and diarrhea.  Neurological:  Negative for dizziness and weakness.    Patient Active Problem List    Diagnosis Date Noted   Acquired thrombophilia ([place]) 03/20/2021   Mood disorder ([place]) 05/02/2020   Chronic heart failure with preserved ejection fraction ([place]) 04/12/2020   Diverticulosis of large intestine without perforation or abscess with bleeding 03/27/2020   AF (paroxysmal atrial fibrillation) (HCC)    COPD (chronic obstructive pulmonary disease) with chronic bronchitis    CKD (chronic kidney disease), stage IIIa 02/18/2020   Malnutrition of mild degree ([place]) 01/08/2020   Age-related macular degeneration, dry, left eye 06/21/2019   Age-related macular degeneration, wet, right eye ([place]) 06/21/2019   Moderate nonproliferative diabetic retinopathy associated with type 2 diabetes mellitus ([place]) 06/21/2019   Lumbosacral radiculopathy at L4 05/01/2019   Underweight 05/01/2019   Encounter for long-term (current) use of aspirin 10/31/2018   Long term current use of oral hypoglycemic drug 10/31/2018   Encounter for long-term (current) use of insulin ([place]) 10/31/2018   Peptic ulcer disease 08/11/2018   Leg pain 07/03/2017   Carpal tunnel syndrome on both sides 05/05/2017   Myalgia due to HMG CoA reductase inhibitor 05/05/2017   History of CVA (cerebrovascular accident) 02/15/2017   Degenerative disc disease, lumbar 12/21/2016   Atherosclerosis of native arteries of extremity with intermittent claudication ([place]) 12/20/2016   Bilateral carotid artery stenosis 12/20/2016   Occlusion and stenosis of bilateral carotid arteries 12/20/2016   Hip bursitis 05/18/2016   Elevated TSH 01/18/2016  CKD stage 3 due to type 2 diabetes mellitus ([place]) 01/16/2016   Senile ecchymosis 01/16/2016   Type II diabetes mellitus with complication (HCC)    GERD (gastroesophageal reflux disease) 07/24/2015   PVD (peripheral vascular disease) ([place]) 05/17/2015   Neoplasm of uncertain behavior of skin 05/17/2015   Retinopathy, diabetic, proliferative ([place]) 04/11/2015   Hyperlipidemia associated with type 2 diabetes  mellitus ([place]) 04/11/2015   Essential hypertension 04/11/2015   Generalized OA 04/11/2015   Proliferative diabetic retinopathy(362.02) 04/11/2015   Arteriosclerosis of coronary artery 05/29/2013   Hypertensive heart disease without CHF 05/29/2013    Allergies  Allergen Reactions   Cefdinir Diarrhea   Saxagliptin Diarrhea   Epinephrine Other (See Comments)    Patient does not remember what happens when she uses this   Atorvastatin Other (See Comments)    Muscle aches   Codeine Other (See Comments)    Upset stomach   Ezetimibe Other (See Comments)    Myalgias(ZETIA)   Limonene Rash    Patient does not recall this reaction   Nitrofurantoin Rash and Other (See Comments)    Pruitus   Sulfa Antibiotics Rash and Other (See Comments)    Sore mouth     Past Surgical History:  Procedure Laterality Date   CARDIAC CATHETERIZATION  1998   40% LM, 95% Ramus interm   CATARACT EXTRACTION, BILATERAL     COLONOSCOPY WITH PROPOFOL N/A 08/12/2018   Procedure: COLONOSCOPY WITH PROPOFOL;  Surgeon: Lucilla Lame, MD;  Location: [facility] ENDOSCOPY;  Service: Endoscopy;  Laterality: N/A;   ENDARTERECTOMY FEMORAL Left 10/12/2018   Procedure: ENDARTERECTOMY FEMORAL;  Surgeon: Katha Cabal, MD;  Location: [facility] ORS;  Service: Vascular;  Laterality: Left;  angioplasty and left SFA stent placement   EYE SURGERY Bilateral    cataract extractions   LOWER EXTREMITY ANGIOGRAPHY Left 08/23/2017   Procedure: Lower Extremity Angiography;  Surgeon: Algernon Huxley, MD;  Location: [facility];  Service: Cardiovascular;  Laterality: Left;   LOWER EXTREMITY ANGIOGRAPHY Left 07/26/2018   Procedure: LOWER EXTREMITY ANGIOGRAPHY;  Surgeon: Katha Cabal, MD;  Location: [place] [facility] LAB;  Service: Cardiovascular;  Laterality: Left;   LOWER EXTREMITY ANGIOGRAPHY Left 09/16/2018   Procedure: LOWER EXTREMITY ANGIOGRAPHY;  Surgeon: Katha Cabal, MD;  Location: [place] [facility];  Service:  Cardiovascular;  Laterality: Left;   PTCA  08/2013   Left common iliac   PTCA  12/2012   left ext iliac   RIGHT HEART CATH N/A 02/23/2020   Procedure: RIGHT HEART CATH;  Surgeon: Minna Merritts, MD;  Location: [place] [facility] LAB;  Service: Cardiovascular;  Laterality: N/A;   TUBAL LIGATION      Social History   Tobacco Use   Smoking status: Former    Packs/day: 2.00    Years: 37.00    Total pack years: 74.00    Types: Cigarettes    Quit date: 1980    Years since quitting: 43.9   Smokeless tobacco: Never   Tobacco comments:    smoking cessation materials not required  Vaping Use   Vaping Use: Never used  Substance Use Topics   Alcohol use: No    Alcohol/week: 0.0 standard drinks of alcohol   Drug use: No     Medication list has been reviewed and updated.  Current Meds  Medication Sig   acetaminophen (TYLENOL) 650 MG CR tablet Take 1,300 mg by mouth every 8 (eight) hours as needed for pain.   albuterol (VENTOLIN HFA)  108 (90 Base) MCG/ACT inhaler Inhale 2 puffs into the lungs every 6 (six) hours as needed for wheezing or shortness of breath.   amLODipine (NORVASC) 5 MG tablet Take 5 mg by mouth in the morning and at bedtime.   apixaban (ELIQUIS) 2.5 MG TABS tablet TAKE 1 TABLET(2.5 MG) BY MOUTH TWICE DAILY   cloNIDine (CATAPRES) 0.1 MG tablet Take 0.1 mg by mouth 2 (two) times daily as needed.   Cyanocobalamin 1000 MCG TBCR Take 1,000 mcg by mouth daily.   feeding supplement, GLUCERNA SHAKE, (GLUCERNA SHAKE) LIQD Take 237 mLs by mouth 3 (three) times daily between meals.   ferrous sulfate 325 (65 FE) MG tablet Take 325 mg by mouth daily with breakfast.   gabapentin (NEURONTIN) 100 MG capsule Take 1 capsule (100 mg total) by mouth at bedtime.   insulin glargine (LANTUS) 100 UNIT/ML injection Inject 0.05 mLs (5 Units total) into the skin daily.   Insulin Lispro w/ Trans Port 100 UNIT/ML SOPN    Insulin Pen Needle (B-D ULTRAFINE III SHORT PEN) 31G X 8 MM MISC USE AS  DIRECTED   losartan (COZAAR) 25 MG tablet Take 25 mg by mouth 2 (two) times daily. Unsure of dosage   melatonin 5 MG TABS Take 0.5 tablets (2.5 mg total) by mouth at bedtime as needed (sleep).   Multiple Vitamins-Minerals (PRESERVISION AREDS PO) Take 1 capsule by mouth 2 (two) times daily.    promethazine-dextromethorphan (PROMETHAZINE-DM) 6.25-15 MG/5ML syrup TAKE 5 ML BY MOUTH FOUR TIMES DAILY AS NEEDED FOR COUGH   REPATHA PUSHTRONEX SYSTEM 420 MG/3.5ML SOCT SMARTSIG:420 Milligram(s) SUB-Q Once a Month   torsemide (DEMADEX) 20 MG tablet Take 2 tablets (40 mg total) by mouth daily. (Patient taking differently: Take 40 mg by mouth 2 (two) times daily. 1/2 tablet morning and 1/2 tablet at night.)       07/06/2022    2:15 PM 03/24/2022   10:17 AM 02/23/2022    3:13 PM 11/06/2021   10:50 AM  GAD 7 : Generalized Anxiety Score  Nervous, Anxious, on Edge 2 0 1 2  Control/stop worrying 1 0 1 3  Worry too much - different things 1 0 1 3  Trouble relaxing 2 0 1 0  Restless 1 0 1 0  Easily annoyed or irritable _0 Afraid - awful might happen 0 0 0 3  Total GAD 7 Score _1 Anxiety Difficulty Somewhat difficult Not difficult at all Somewhat difficult Somewhat difficult       07/06/2022    2:14 PM 03/24/2022   10:17 AM 03/11/2022    1:18 PM  Depression screen PHQ 2/9  Decreased Interest 2 2 0  Down, Depressed, Hopeless 3 2 0  PHQ - 2 Score 5 4 0  Altered sleeping 3 2 0  Tired, decreased energy _2 Change in appetite 1 0 1  Feeling bad or failure about yourself  3 0 1  Trouble concentrating 1 0 1  Moving slowly or fidgety/restless 1 0 0  Suicidal thoughts 1 0 0  PHQ-9 Score _3 Difficult doing work/chores Somewhat difficult Not difficult at all Not difficult at all    BP Readings from Last 3 Encounters:  09/16/22 128/74  07/06/22 (!) 108/40  06/04/22 (!) 150/72    Physical Exam Vitals and nursing note reviewed.  Constitutional:      General: She is not in acute  distress.    Appearance: Normal appearance.  She is well-developed.  HENT:     Head: Normocephalic and atraumatic.  Cardiovascular:     Rate and Rhythm: Normal rate and regular rhythm.  Pulmonary:     Effort: Pulmonary effort is normal. No respiratory distress.     Breath sounds: No wheezing or rhonchi.  Musculoskeletal:     Cervical back: Normal range of motion.  Lymphadenopathy:     Cervical: No cervical adenopathy.  Skin:    General: Skin is warm and dry.     Findings: No rash.  Neurological:     Mental Status: She is alert and oriented to person, place, and time.  Psychiatric:        Mood and Affect: Mood normal.        Behavior: Behavior normal.     Wt Readings from Last 3 Encounters:  09/16/22 115 lb (52.2 kg)  07/06/22 109 lb 9.6 oz (49.7 kg)  03/24/22 107 lb (48.5 kg)    BP 128/74   Pulse 70   Temp 98 F (36.7 C) (Oral)   Ht _0  (1.549 m)   Wt 115 lb (52.2 kg)   SpO2 93%   BMI 21.73 kg/m   Assessment and Plan: 1. COPD (chronic obstructive pulmonary disease) with chronic bronchitis No evidence of active infection or decompensation at this time Continue cough syrup at bedtime Add Mucinex bid Use albuterol inhaler tid - promethazine-dextromethorphan (PROMETHAZINE-DM) 6.25-15 MG/5ML syrup; 1 tsp po qhs  Dispense: 180 mL; Refill: 0   Partially dictated using Editor, commissioning. Any errors are unintentional.  [name], MD [facility]  09/16/2022

## 2022-09-22 ENCOUNTER — Ambulatory Visit: Payer: Medicare HMO | Admitting: Internal Medicine

## 2022-09-24 ENCOUNTER — Other Ambulatory Visit: Payer: Self-pay | Admitting: Internal Medicine

## 2022-09-24 DIAGNOSIS — M5417 Radiculopathy, lumbosacral region: Secondary | ICD-10-CM

## 2022-09-24 NOTE — Telephone Encounter (Signed)
Requested Prescriptions  Pending Prescriptions Disp Refills   gabapentin (NEURONTIN) 100 MG capsule [Pharmacy Med Name: GABAPENTIN '100MG'$  CAPSULES] 90 capsule 1    Sig: TAKE 1 CAPSULE(100 MG) BY MOUTH AT BEDTIME     Neurology: Anticonvulsants - gabapentin Failed - 09/24/2022 11:16 AM      Failed - Cr in normal range and within 360 days    Creatinine  Date Value Ref Range Status  05/29/2014 1.10 0.60 - 1.30 mg/dL Final   Creatinine, Ser  Date Value Ref Range Status  03/24/2022 1.35 (H) 0.57 - 1.00 mg/dL Final         Passed - Completed PHQ-2 or PHQ-9 in the last 360 days      Passed - Valid encounter within last 12 months    Recent Outpatient Visits           1 week ago COPD (chronic obstructive pulmonary disease) with chronic bronchitis   [place] [facility] at [facility], Jesse Sans, MD   2 months ago COPD with exacerbation [facility])   [place] [facility] at [facility], Jesse Sans, MD   6 months ago Annual physical exam   [place] [facility] at [facility], Jesse Sans, MD   7 months ago Unintentional weight loss   [facility] Care and Sports Medicine at [facility], Jesse Sans, MD   10 months ago Cellulitis of left lower extremity   [facility] at [facility], Jesse Sans, MD

## 2022-09-29 ENCOUNTER — Ambulatory Visit: Payer: Self-pay | Admitting: *Deleted

## 2022-09-29 NOTE — Patient Outreach (Signed)
  Care Coordination   09/29/2022 Name: BONNE WHACK MRN: [id number] DOB: [birth year]   Care Coordination Outreach Attempts:  An unsuccessful telephone outreach was attempted for a scheduled appointment today.  Follow Up Plan:  Additional outreach attempts will be made to offer the patient care coordination information and services.   Encounter Outcome:  No Answer   Care Coordination Interventions:  No, not indicated    Valente David, RN, MSN, [facility] Care Management Care Management Coordinator [phone]

## 2022-09-30 DIAGNOSIS — H40022 Open angle with borderline findings, high risk, left eye: Secondary | ICD-10-CM | POA: Diagnosis not present

## 2022-09-30 DIAGNOSIS — Z961 Presence of intraocular lens: Secondary | ICD-10-CM | POA: Diagnosis not present

## 2022-10-06 DIAGNOSIS — H353211 Exudative age-related macular degeneration, right eye, with active choroidal neovascularization: Secondary | ICD-10-CM | POA: Diagnosis not present

## 2022-10-06 DIAGNOSIS — Z1231 Encounter for screening mammogram for malignant neoplasm of breast: Secondary | ICD-10-CM | POA: Diagnosis not present

## 2022-10-08 ENCOUNTER — Ambulatory Visit: Payer: Self-pay | Admitting: *Deleted

## 2022-10-08 NOTE — Patient Instructions (Signed)
Visit Information  Thank you for taking time to visit with me today. Please don't hesitate to contact me if I can be of assistance to you before our next scheduled telephone appointment.  Following are the goals we discussed today:  Call PCP office to schedule follow up appointment.  Will need A1C redone as well.  Our next appointment is by telephone on 2/26  Please call the care guide team at [phone] if you need to cancel or reschedule your appointment.   Please call the Suicide and Crisis Lifeline: 988 call the [place] National Suicide Prevention Lifeline: 5710692678 or TTY: 915-102-8879 TTY [phone]) to talk to a trained counselor call 1-800-273-TALK (toll free, 24 hour hotline) call 911 if you are experiencing a Mental Health or [place] or need someone to talk to.  Patient verbalizes understanding of instructions and care plan provided today and agrees to view in [place]. Active MyChart status and patient understanding of how to access instructions and care plan via MyChart confirmed with patient.     The patient has been provided with contact information for the care management team and has been advised to call with any health related questions or concerns.   Valente David, RN, MSN, [place] Care Management Care Management Coordinator [phone]

## 2022-10-08 NOTE — Patient Outreach (Signed)
  Care Coordination   Follow Up Visit Note   10/08/2022 Name: Jill Hudson MRN: [id number] DOB: [birth year]  Jill Hudson is a 87 y.o. year old female who sees [name], MD for primary care. I spoke with  [name] by phone today.  What matters to the patients health and wellness today?  Developed cough recently, PCP visit done on 11/22.  Denies any urgent concerns, encouraged to contact this care manager with questions.     Goals Addressed             This Visit's Progress    RNCM: 'Kermit passed away a few weeks ago"   On track    Care Coordination Interventions: Evaluation of current treatment plan related to the grief process and the loss of her husband of almost 30 years on 04-18-2022 and patient's adherence to plan as established by provider Advised patient to call the office or RNCM for unresolved grief, changes in her mood, anxiety, depression, or other needs related to the grief process and the passing of her husband Discussed plans with patient for ongoing care management follow up and provided patient with direct contact information for care management team Advised patient to discuss changes in her mental health and well being with provider Screening for signs and symptoms of depression related to chronic disease state  Assessed social determinant of health barriers  12/14 - Daughter remains in the home for support  Active listening / Reflection utilized  Emotional Support Provided      RNCM: Effective Management of DM   On track    Care Coordination Interventions: Lab Results  Component Value Date   HGBA1C 7.4 (H) 03/24/2022    Provided education to patient about basic DM disease process. The patient states her blood sugars are more stable now. Range 100s to just below 200 with meals Reviewed medications with patient and discussed importance of medication adherence. Is compliant with medications.  Counseled on importance of regular laboratory monitoring  as prescribed. The patient has regular lab work.  Discussed plans with patient for ongoing care management follow up and provided patient with direct contact information for care management team Provided patient with written educational materials related to hypo and hyperglycemia and importance of correct treatment. Review and education. The patient states she has a continuous reader. Sarah calls the specialist when she has changes in her readings that are low or very high Reviewed scheduled/upcoming provider appointments including:  Last seen on 11/22 for cough, no follow up scheduled.  Advised to call to schedule routine follow up Advised patient, providing education and rationale, to check cbg QID and record, calling pcp or endocrinologist  for findings outside established parameters. Continuous reader.  Review of patient status, including review of consultants reports, relevant laboratory and other test results, and medications completed Patient report decrease cough with use of Mucinex and inhalers          SDOH assessments and interventions completed:  No     Care Coordination Interventions:  Yes, provided   Follow up plan: Follow up call scheduled for 2/26    Encounter Outcome:  Pt. Visit Completed   Valente David, RN, MSN, [place] Care Management Care Management Coordinator [phone]

## 2022-10-15 DIAGNOSIS — E1165 Type 2 diabetes mellitus with hyperglycemia: Secondary | ICD-10-CM | POA: Diagnosis not present

## 2022-10-15 DIAGNOSIS — E1142 Type 2 diabetes mellitus with diabetic polyneuropathy: Secondary | ICD-10-CM | POA: Diagnosis not present

## 2022-10-15 DIAGNOSIS — Z794 Long term (current) use of insulin: Secondary | ICD-10-CM | POA: Diagnosis not present

## 2022-10-15 DIAGNOSIS — E1169 Type 2 diabetes mellitus with other specified complication: Secondary | ICD-10-CM | POA: Diagnosis not present

## 2022-10-15 LAB — HM DIABETES EYE EXAM

## 2022-10-30 ENCOUNTER — Other Ambulatory Visit: Payer: Self-pay | Admitting: Internal Medicine

## 2022-10-30 DIAGNOSIS — J4489 Other specified chronic obstructive pulmonary disease: Secondary | ICD-10-CM

## 2022-10-30 NOTE — Telephone Encounter (Signed)
Requested Prescriptions  Pending Prescriptions Disp Refills   promethazine-dextromethorphan (PROMETHAZINE-DM) 6.25-15 MG/5ML syrup [Pharmacy Med Name: PROMETHAZINE DM SYRUP] 180 mL 0    Sig: TAKE 5 ML BY MOUTH EVERY NIGHT AT BEDTIME     Ear, Nose, and Throat:  Antitussives/Expectorants Passed - 10/30/2022  1:17 PM      Passed - Valid encounter within last 12 months    Recent Outpatient Visits           1 month ago COPD (chronic obstructive pulmonary disease) with chronic bronchitis   [facility] at [facility], Jesse Sans, MD   3 months ago COPD with exacerbation [facility])   [place] [facility] at [facility], Jesse Sans, MD   7 months ago Annual physical exam   [place] [facility] at [facility], Jesse Sans, MD   8 months ago Unintentional weight loss   [facility] at [facility], Jesse Sans, MD   11 months ago Cellulitis of left lower extremity   [place] [facility] at [facility], Jesse Sans, MD

## 2022-11-05 DIAGNOSIS — Z5181 Encounter for therapeutic drug level monitoring: Secondary | ICD-10-CM | POA: Diagnosis not present

## 2022-11-05 DIAGNOSIS — E7800 Pure hypercholesterolemia, unspecified: Secondary | ICD-10-CM | POA: Diagnosis not present

## 2022-11-05 DIAGNOSIS — I509 Heart failure, unspecified: Secondary | ICD-10-CM | POA: Diagnosis not present

## 2022-11-05 DIAGNOSIS — I110 Hypertensive heart disease with heart failure: Secondary | ICD-10-CM | POA: Diagnosis not present

## 2022-11-16 DIAGNOSIS — E1165 Type 2 diabetes mellitus with hyperglycemia: Secondary | ICD-10-CM | POA: Diagnosis not present

## 2022-11-16 DIAGNOSIS — E1169 Type 2 diabetes mellitus with other specified complication: Secondary | ICD-10-CM | POA: Diagnosis not present

## 2022-11-16 DIAGNOSIS — E1142 Type 2 diabetes mellitus with diabetic polyneuropathy: Secondary | ICD-10-CM | POA: Diagnosis not present

## 2022-11-16 DIAGNOSIS — Z794 Long term (current) use of insulin: Secondary | ICD-10-CM | POA: Diagnosis not present

## 2022-11-17 DIAGNOSIS — H353211 Exudative age-related macular degeneration, right eye, with active choroidal neovascularization: Secondary | ICD-10-CM | POA: Diagnosis not present

## 2022-12-07 ENCOUNTER — Other Ambulatory Visit: Payer: Self-pay | Admitting: Internal Medicine

## 2022-12-07 DIAGNOSIS — J4489 Other specified chronic obstructive pulmonary disease: Secondary | ICD-10-CM

## 2022-12-09 DIAGNOSIS — E1122 Type 2 diabetes mellitus with diabetic chronic kidney disease: Secondary | ICD-10-CM | POA: Diagnosis not present

## 2022-12-09 DIAGNOSIS — R600 Localized edema: Secondary | ICD-10-CM | POA: Diagnosis not present

## 2022-12-09 DIAGNOSIS — N1832 Chronic kidney disease, stage 3b: Secondary | ICD-10-CM | POA: Diagnosis not present

## 2022-12-09 DIAGNOSIS — I10 Essential (primary) hypertension: Secondary | ICD-10-CM | POA: Diagnosis not present

## 2022-12-17 DIAGNOSIS — Z794 Long term (current) use of insulin: Secondary | ICD-10-CM | POA: Diagnosis not present

## 2022-12-17 DIAGNOSIS — E119 Type 2 diabetes mellitus without complications: Secondary | ICD-10-CM | POA: Diagnosis not present

## 2022-12-21 ENCOUNTER — Ambulatory Visit: Payer: Self-pay | Admitting: *Deleted

## 2022-12-21 NOTE — Patient Outreach (Signed)
  Care Coordination   Follow Up Visit Note   12/21/2022 Name: TANAKA ANDRETTA MRN: [id number] DOB: 07-Oct-1935  ARDES GALBAN is a 88 y.o. year old female who sees [name], MD for primary care. I spoke with Judson Roch, daughter of EARLYNE FERDERER by phone today.  What matters to the patients health and wellness today?  Keeping patient on proper diet and blood sugars down.     Goals Addressed             This Visit's Progress    RNCM: Effective Management of DM       Care Coordination Interventions:  Provided education to patient about basic DM disease process. The patient states her blood sugars are more stable now. Range 100s, did increase to 270s over the past couple days.  Daughter report patient eating bread and butter pickles Reviewed medications with patient and discussed importance of medication adherence.  No longer taking Jardiance, has decided not to take Repatha anymore.  Has restarted Rosuvastatin Counseled on importance of regular laboratory monitoring as prescribed. The patient has regular lab work, last A1C was 7.1 Discussed plans with patient for ongoing care management follow up and provided patient with direct contact information for care management team Provided patient with written educational materials related to hypo and hyperglycemia and importance of correct treatment. Review and education.  Reviewed scheduled/upcoming provider appointments including:  PCP on 3/18, endocrinology on 4/12 Advised patient, providing education and rationale, to check cbg QID and record, calling pcp or endocrinologist  for findings outside established parameters. Continuous reader.  Review of patient status, including review of consultants reports, relevant laboratory and other test results, and medications completed          Interventions Today    Flowsheet Row Most Recent Value  Chronic Disease   Chronic disease during today's visit Diabetes  General Interventions   General  Interventions Discussed/Reviewed General Interventions Reviewed, Doctor Visits  Doctor Visits Discussed/Reviewed Doctor Visits Reviewed, Specialist, PCP  PCP/Specialist Visits Compliance with follow-up visit  Education Interventions   Education Provided Provided Web-based Education  Pharmacy Interventions   Pharmacy Dicussed/Reviewed Medications and their functions       SDOH assessments and interventions completed:  No     Care Coordination Interventions:  Yes, provided   Follow up plan: Follow up call scheduled for 3/27    Encounter Outcome:  Pt. Visit Completed   Valente David, RN, MSN, [place] [facility] Care Management Coordinator [phone]

## 2022-12-21 NOTE — Patient Instructions (Signed)
Visit Information  Thank you for taking time to visit with me today. Please don't hesitate to contact me if I can be of assistance to you before our next scheduled telephone appointment.  Following are the goals we discussed today:  Follow diabetic diet.  Monitor blood sugars at least daily.  Discuss stopping Repatha with provider and inquire about rechecking lipid levels.   Our next appointment is by telephone on 3/27  Please call the care guide team at [phone] if you need to cancel or reschedule your appointment.   Please call the Suicide and Crisis Lifeline: 988 call the [place] National Suicide Prevention Lifeline: 657-081-9693 or TTY: 705-642-8730 TTY (920) 381-2841) to talk to a trained counselor call 1-800-273-TALK (toll free, 24 hour hotline) call 911 if you are experiencing a Mental Health or [place] or need someone to talk to.  Patient verbalizes understanding of instructions and care plan provided today and agrees to view in [place]. Active MyChart status and patient understanding of how to access instructions and care plan via MyChart confirmed with patient.     The patient has been provided with contact information for the care management team and has been advised to call with any health related questions or concerns.   [place] Management Care Management Coordinator [phone]

## 2022-12-29 DIAGNOSIS — H353221 Exudative age-related macular degeneration, left eye, with active choroidal neovascularization: Secondary | ICD-10-CM | POA: Diagnosis not present

## 2022-12-29 DIAGNOSIS — H353211 Exudative age-related macular degeneration, right eye, with active choroidal neovascularization: Secondary | ICD-10-CM | POA: Diagnosis not present

## 2023-01-01 ENCOUNTER — Ambulatory Visit (INDEPENDENT_AMBULATORY_CARE_PROVIDER_SITE_OTHER): Payer: Medicare HMO | Admitting: Internal Medicine

## 2023-01-01 ENCOUNTER — Encounter: Payer: Self-pay | Admitting: Internal Medicine

## 2023-01-01 VITALS — BP 100/62 | Ht 61.0 in | Wt 120.0 lb

## 2023-01-01 DIAGNOSIS — J438 Other emphysema: Secondary | ICD-10-CM | POA: Diagnosis not present

## 2023-01-01 DIAGNOSIS — I10 Essential (primary) hypertension: Secondary | ICD-10-CM

## 2023-01-01 DIAGNOSIS — E782 Mixed hyperlipidemia: Secondary | ICD-10-CM

## 2023-01-01 DIAGNOSIS — E118 Type 2 diabetes mellitus with unspecified complications: Secondary | ICD-10-CM

## 2023-01-01 MED ORDER — ROSUVASTATIN CALCIUM 10 MG PO TABS: 10.0000 mg | ORAL_TABLET | ORAL | 5 refills | Status: DC

## 2023-01-01 MED ORDER — ALBUTEROL SULFATE HFA 108 (90 BASE) MCG/ACT IN AERS: 2.0000 | INHALATION_SPRAY | Freq: Four times a day (QID) | RESPIRATORY_TRACT | 5 refills | Status: DC | PRN

## 2023-01-01 MED ORDER — ARNUITY ELLIPTA 100 MCG/ACT IN AEPB: 1.0000 | INHALATION_SPRAY | Freq: Every day | RESPIRATORY_TRACT | 0 refills | Status: DC

## 2023-01-01 NOTE — Assessment & Plan Note (Signed)
Followed by Endo Last A1c was 7.1 on insulin injections Eye exam up to date

## 2023-01-01 NOTE — Assessment & Plan Note (Signed)
Clinically stable exam with well controlled BP on losartan and amlodipine. BP appear to be fairly labile. Tolerating medications without side effects. Pt to continue current regimen and low sodium diet.

## 2023-01-01 NOTE — Progress Notes (Signed)
Date:  01/01/2023   Name:  Jill Hudson   DOB:  07-Jul-1935   MRN:  [id number]   Chief Complaint: Hypertension  Hypertension This is a chronic problem. The problem is controlled. Past treatments include angiotensin blockers and calcium channel blockers. The current treatment provides significant improvement. Hypertensive end-organ damage includes kidney disease, CAD/MI and PVD.  Diabetes She presents for her follow-up diabetic visit. She has type 2 diabetes mellitus. Her disease course has been stable. Diabetic complications include PVD. Current diabetic treatment includes intensive insulin program.  Cough This is a chronic problem. The problem occurs every few minutes. The cough is Non-productive. She has tried a beta-agonist inhaler (and nightly cough syrup) for the symptoms. Her past medical history is significant for emphysema.    Lab Results  Component Value Date   NA 137 03/24/2022   K 4.2 03/24/2022   CO2 22 03/24/2022   GLUCOSE 154 (H) 03/24/2022   BUN 33 (H) 03/24/2022   CREATININE 1.35 (H) 03/24/2022   CALCIUM 9.6 03/24/2022   EGFR 39 (L) 03/24/2022   GFRNONAA 35 08/19/2021   Lab Results  Component Value Date   CHOL 179 03/24/2022   HDL 48 03/24/2022   LDLCALC 98 03/24/2022   TRIG 191 (H) 03/24/2022   CHOLHDL 3.7 03/24/2022   Lab Results  Component Value Date   TSH 5.490 (H) 03/24/2022   Lab Results  Component Value Date   HGBA1C 7.1 09/04/2022   Lab Results  Component Value Date   WBC 7.9 03/24/2022   HGB 11.2 03/24/2022   HCT 33.0 (L) 03/24/2022   MCV 91 03/24/2022   PLT 273 03/24/2022   Lab Results  Component Value Date   ALT 19 03/24/2022   AST 21 03/24/2022   ALKPHOS 96 03/24/2022   BILITOT 0.3 03/24/2022   No results found for: "25OHVITD2", "25OHVITD3", "VD25OH"   Review of Systems  Respiratory:  Positive for cough.     Patient Active Problem List   Diagnosis Date Noted   Acquired thrombophilia ([place]) 03/20/2021   Mood disorder ([place])  05/02/2020   Chronic heart failure with preserved ejection fraction ([place]) 04/12/2020   Diverticulosis of large intestine without perforation or abscess with bleeding 03/27/2020   AF (paroxysmal atrial fibrillation) (HCC)    COPD (chronic obstructive pulmonary disease) with chronic bronchitis    CKD (chronic kidney disease), stage IIIa 02/18/2020   Malnutrition of mild degree ([place]) 01/08/2020   Age-related macular degeneration, dry, left eye 06/21/2019   Age-related macular degeneration, wet, right eye ([place]) 06/21/2019   Moderate nonproliferative diabetic retinopathy associated with type 2 diabetes mellitus ([place]) 06/21/2019   Lumbosacral radiculopathy at L4 05/01/2019   Underweight 05/01/2019   Encounter for long-term (current) use of aspirin 10/31/2018   Long term current use of oral hypoglycemic drug 10/31/2018   Encounter for long-term (current) use of insulin ([place]) 10/31/2018   Peptic ulcer disease 08/11/2018   Leg pain 07/03/2017   Carpal tunnel syndrome on both sides 05/05/2017   Myalgia due to HMG CoA reductase inhibitor 05/05/2017   History of CVA (cerebrovascular accident) 02/15/2017   Degenerative disc disease, lumbar 12/21/2016   Atherosclerosis of native arteries of extremity with intermittent claudication ([place]) 12/20/2016   Bilateral carotid artery stenosis 12/20/2016   Occlusion and stenosis of bilateral carotid arteries 12/20/2016   Hip bursitis 05/18/2016   Elevated TSH 01/18/2016   CKD stage 3 due to type 2 diabetes mellitus ([place]) 01/16/2016   Senile ecchymosis 01/16/2016  Type II diabetes mellitus with complication (HCC)    GERD (gastroesophageal reflux disease) 07/24/2015   PVD (peripheral vascular disease) ([place]) 05/17/2015   Neoplasm of uncertain behavior of skin 05/17/2015   Retinopathy, diabetic, proliferative ([place]) 04/11/2015   Hyperlipidemia associated with type 2 diabetes mellitus ([place]) 04/11/2015   Essential hypertension 04/11/2015   Generalized OA  04/11/2015   Proliferative diabetic retinopathy(362.02) 04/11/2015   Arteriosclerosis of coronary artery 05/29/2013   Hypertensive heart disease without CHF 05/29/2013    Allergies  Allergen Reactions   Cefdinir Diarrhea   Saxagliptin Diarrhea   Epinephrine Other (See Comments)    Patient does not remember what happens when she uses this   Atorvastatin Other (See Comments)    Muscle aches   Codeine Other (See Comments)    Upset stomach   Ezetimibe Other (See Comments)    Myalgias(ZETIA)   Limonene Rash    Patient does not recall this reaction   Nitrofurantoin Rash and Other (See Comments)    Pruitus   Sulfa Antibiotics Rash and Other (See Comments)    Sore mouth     Past Surgical History:  Procedure Laterality Date   CARDIAC CATHETERIZATION  1998   40% LM, 95% Ramus interm   CATARACT EXTRACTION, BILATERAL     COLONOSCOPY WITH PROPOFOL N/A 08/12/2018   Procedure: COLONOSCOPY WITH PROPOFOL;  Surgeon: Lucilla Lame, MD;  Location: [facility] ENDOSCOPY;  Service: Endoscopy;  Laterality: N/A;   ENDARTERECTOMY FEMORAL Left 10/12/2018   Procedure: ENDARTERECTOMY FEMORAL;  Surgeon: Katha Cabal, MD;  Location: [facility] ORS;  Service: Vascular;  Laterality: Left;  angioplasty and left SFA stent placement   EYE SURGERY Bilateral    cataract extractions   LOWER EXTREMITY ANGIOGRAPHY Left 08/23/2017   Procedure: Lower Extremity Angiography;  Surgeon: Algernon Huxley, MD;  Location: [place] CV LAB;  Service: Cardiovascular;  Laterality: Left;   LOWER EXTREMITY ANGIOGRAPHY Left 07/26/2018   Procedure: LOWER EXTREMITY ANGIOGRAPHY;  Surgeon: Katha Cabal, MD;  Location: [place] CV LAB;  Service: Cardiovascular;  Laterality: Left;   LOWER EXTREMITY ANGIOGRAPHY Left 09/16/2018   Procedure: LOWER EXTREMITY ANGIOGRAPHY;  Surgeon: Katha Cabal, MD;  Location: [place] CV LAB;  Service: Cardiovascular;  Laterality: Left;   PTCA  08/2013   Left common iliac   PTCA  12/2012    left ext iliac   RIGHT HEART CATH N/A 02/23/2020   Procedure: RIGHT HEART CATH;  Surgeon: Minna Merritts, MD;  Location: [place] CV LAB;  Service: Cardiovascular;  Laterality: N/A;   TUBAL LIGATION      Social History   Tobacco Use   Smoking status: Former    Packs/day: 2.00    Years: 37.00    Total pack years: 74.00    Types: Cigarettes    Quit date: 1980    Years since quitting: 44.2   Smokeless tobacco: Never   Tobacco comments:    smoking cessation materials not required  Vaping Use   Vaping Use: Never used  Substance Use Topics   Alcohol use: No    Alcohol/week: 0.0 standard drinks of alcohol   Drug use: No     Medication list has been reviewed and updated.  Current Meds  Medication Sig   acetaminophen (TYLENOL) 650 MG CR tablet Take 1,300 mg by mouth every 8 (eight) hours as needed for pain.   amLODipine (NORVASC) 5 MG tablet Take 5 mg by mouth in the morning and at bedtime.   apixaban (ELIQUIS) 2.5  MG TABS tablet TAKE 1 TABLET(2.5 MG) BY MOUTH TWICE DAILY   cloNIDine (CATAPRES) 0.1 MG tablet Take 0.1 mg by mouth 2 (two) times daily as needed.   Cyanocobalamin 1000 MCG TBCR Take 1,000 mcg by mouth daily.   feeding supplement, GLUCERNA SHAKE, (GLUCERNA SHAKE) LIQD Take 237 mLs by mouth 3 (three) times daily between meals.   ferrous sulfate 325 (65 FE) MG tablet Take 325 mg by mouth daily with breakfast.   Fluticasone Furoate (ARNUITY ELLIPTA) 100 MCG/ACT AEPB Inhale 1 Inhaler into the lungs daily at 2 PM.   gabapentin (NEURONTIN) 100 MG capsule TAKE 1 CAPSULE(100 MG) BY MOUTH AT BEDTIME   insulin glargine (LANTUS) 100 UNIT/ML injection Inject 0.05 mLs (5 Units total) into the skin daily.   Insulin Lispro w/ Trans Port 100 UNIT/ML SOPN    Insulin Pen Needle (B-D ULTRAFINE III SHORT PEN) 31G X 8 MM MISC USE AS DIRECTED   losartan (COZAAR) 25 MG tablet Take 25 mg by mouth 2 (two) times daily. Unsure of dosage   melatonin 5 MG TABS Take 0.5 tablets (2.5 mg  total) by mouth at bedtime as needed (sleep).   Multiple Vitamins-Minerals (PRESERVISION AREDS PO) Take 1 capsule by mouth 2 (two) times daily.    promethazine-dextromethorphan (PROMETHAZINE-DM) 6.25-15 MG/5ML syrup TAKE 5 ML BY MOUTH EVERY NIGHT AT BEDTIME   torsemide (DEMADEX) 20 MG tablet Take 2 tablets (40 mg total) by mouth daily. (Patient taking differently: Take 40 mg by mouth 2 (two) times daily. 1/2 tablet morning and 1/2 tablet at night.)   [DISCONTINUED] albuterol (VENTOLIN HFA) 108 (90 Base) MCG/ACT inhaler Inhale 2 puffs into the lungs every 6 (six) hours as needed for wheezing or shortness of breath.   [DISCONTINUED] rosuvastatin (CRESTOR) 10 MG tablet Take 10 mg by mouth 4 (four) times a week. Monday, Wed, Friday and Saturday       01/01/2023    3:09 PM 09/16/2022    2:10 PM 07/06/2022    2:15 PM 03/24/2022   10:17 AM  GAD 7 : Generalized Anxiety Score  Nervous, Anxious, on Edge 0 0 2 0  Control/stop worrying 0 0 1 0  Worry too much - different things 0 1 1 0  Trouble relaxing 1 0 2 0  Restless 0 0 1 0  Easily annoyed or irritable 1 0 1 1  Afraid - awful might happen 0 0 0 0  Total GAD 7 Score '2 1 8 1  '$ Anxiety Difficulty Not difficult at all Not difficult at all Somewhat difficult Not difficult at all       01/01/2023    3:09 PM 09/16/2022    2:10 PM 07/06/2022    2:14 PM  Depression screen PHQ 2/9  Decreased Interest '2 2 2  '$ Down, Depressed, Hopeless '2 1 3  '$ PHQ - 2 Score '4 3 5  '$ Altered sleeping '3 1 3  '$ Tired, decreased energy '3 2 3  '$ Change in appetite '1 1 1  '$ Feeling bad or failure about yourself  '1 1 3  '$ Trouble concentrating '2 3 1  '$ Moving slowly or fidgety/restless '2 1 1  '$ Suicidal thoughts '1 1 1  '$ PHQ-9 Score '17 13 18  '$ Difficult doing work/chores Somewhat difficult Somewhat difficult Somewhat difficult    BP Readings from Last 3 Encounters:  01/01/23 100/62  09/16/22 128/74  07/06/22 (!) 108/40    Physical Exam Vitals and nursing note reviewed.   Constitutional:      General: She is not in acute  distress.    Appearance: Normal appearance. She is well-developed.  HENT:     Head: Normocephalic and atraumatic.  Neck:     Vascular: No carotid bruit.  Cardiovascular:     Rate and Rhythm: Normal rate and regular rhythm.     Pulses: Normal pulses.  Pulmonary:     Effort: Pulmonary effort is normal. No respiratory distress.     Breath sounds: No wheezing or rhonchi.  Musculoskeletal:     Cervical back: Normal range of motion.     Right lower leg: No edema.     Left lower leg: No edema.  Lymphadenopathy:     Cervical: No cervical adenopathy.  Skin:    General: Skin is warm and dry.     Findings: No rash.  Neurological:     General: No focal deficit present.     Mental Status: She is alert and oriented to person, place, and time.  Psychiatric:        Mood and Affect: Mood normal.        Behavior: Behavior normal.     Wt Readings from Last 3 Encounters:  01/01/23 120 lb (54.4 kg)  09/16/22 115 lb (52.2 kg)  07/06/22 109 lb 9.6 oz (49.7 kg)    BP 100/62 (BP Location: Left Arm, Cuff Size: Normal)   Ht '5\' 1"'$  (1.549 m)   Wt 120 lb (54.4 kg)   BMI 22.67 kg/m   Assessment and Plan: Problem List Items Addressed This Visit       Cardiovascular and Mediastinum   Essential hypertension (Chronic)    Clinically stable exam with well controlled BP on losartan and amlodipine. BP appear to be fairly labile. Tolerating medications without side effects. Pt to continue current regimen and low sodium diet.       Relevant Medications   rosuvastatin (CRESTOR) 10 MG tablet (Start on 01/02/2023)     Endocrine   Type II diabetes mellitus with complication ([place])    Followed by Endo Last A1c was 7.1 on insulin injections Eye exam up to date      Relevant Medications   rosuvastatin (CRESTOR) 10 MG tablet (Start on 01/02/2023)   Other Visit Diagnoses     Mixed hyperlipidemia    -  Primary   Relevant Medications   rosuvastatin  (CRESTOR) 10 MG tablet (Start on 01/02/2023)   Other emphysema (HCC)       Relevant Medications   Fluticasone Furoate (ARNUITY ELLIPTA) 100 MCG/ACT AEPB   albuterol (VENTOLIN HFA) 108 (90 Base) MCG/ACT inhaler        Partially dictated using Editor, commissioning. Any errors are unintentional.  [name], MD [facility]  01/01/2023

## 2023-01-11 DIAGNOSIS — I110 Hypertensive heart disease with heart failure: Secondary | ICD-10-CM | POA: Diagnosis not present

## 2023-01-11 DIAGNOSIS — I480 Paroxysmal atrial fibrillation: Secondary | ICD-10-CM | POA: Diagnosis not present

## 2023-01-11 DIAGNOSIS — I2510 Atherosclerotic heart disease of native coronary artery without angina pectoris: Secondary | ICD-10-CM | POA: Diagnosis not present

## 2023-01-11 DIAGNOSIS — I5032 Chronic diastolic (congestive) heart failure: Secondary | ICD-10-CM | POA: Diagnosis not present

## 2023-01-12 ENCOUNTER — Other Ambulatory Visit: Payer: Self-pay | Admitting: Internal Medicine

## 2023-01-12 DIAGNOSIS — J4489 Other specified chronic obstructive pulmonary disease: Secondary | ICD-10-CM

## 2023-01-13 NOTE — Telephone Encounter (Signed)
Requested medication (s) are due for refill today: yes  Requested medication (s) are on the active medication list: yes  Last refill:  12/07/22  Future visit scheduled: yes  Notes to clinic:  Unable to refill per protocol, should patient continue to take, routing for review.      Requested Prescriptions  Pending Prescriptions Disp Refills   promethazine-dextromethorphan (PROMETHAZINE-DM) 6.25-15 MG/5ML syrup [Pharmacy Med Name: PROMETHAZINE DM SYRUP] 180 mL 0    Sig: TAKE 5 ML BY MOUTH EVERY NIGHT AT BEDTIME     Ear, Nose, and Throat:  Antitussives/Expectorants Passed - 01/12/2023  2:14 PM      Passed - Valid encounter within last 12 months    Recent Outpatient Visits           1 week ago Mixed hyperlipidemia   [place] [facility], Jesse Sans, MD   3 months ago COPD (chronic obstructive pulmonary disease) with chronic bronchitis   [place] [facility] at [facility], Jesse Sans, MD   6 months ago COPD with exacerbation [facility])   [place] at [facility], Jesse Sans, MD   9 months ago Annual physical exam   [place] at [facility], Jesse Sans, MD   10 months ago Unintentional weight loss   [place] at [facility], Jesse Sans, MD

## 2023-01-17 DIAGNOSIS — R079 Chest pain, unspecified: Secondary | ICD-10-CM | POA: Diagnosis not present

## 2023-01-17 DIAGNOSIS — J441 Chronic obstructive pulmonary disease with (acute) exacerbation: Secondary | ICD-10-CM | POA: Diagnosis not present

## 2023-01-17 DIAGNOSIS — R0789 Other chest pain: Secondary | ICD-10-CM | POA: Diagnosis not present

## 2023-01-17 DIAGNOSIS — Z20822 Contact with and (suspected) exposure to covid-19: Secondary | ICD-10-CM | POA: Diagnosis not present

## 2023-01-17 DIAGNOSIS — R000 Tachycardia, unspecified: Secondary | ICD-10-CM | POA: Diagnosis not present

## 2023-01-17 DIAGNOSIS — I10 Essential (primary) hypertension: Secondary | ICD-10-CM | POA: Diagnosis not present

## 2023-01-17 DIAGNOSIS — E1151 Type 2 diabetes mellitus with diabetic peripheral angiopathy without gangrene: Secondary | ICD-10-CM | POA: Diagnosis not present

## 2023-01-17 DIAGNOSIS — Z87891 Personal history of nicotine dependence: Secondary | ICD-10-CM | POA: Diagnosis not present

## 2023-01-17 DIAGNOSIS — R9431 Abnormal electrocardiogram [ECG] [EKG]: Secondary | ICD-10-CM | POA: Diagnosis not present

## 2023-01-17 DIAGNOSIS — R0600 Dyspnea, unspecified: Secondary | ICD-10-CM | POA: Diagnosis not present

## 2023-01-17 DIAGNOSIS — R0602 Shortness of breath: Secondary | ICD-10-CM | POA: Diagnosis not present

## 2023-01-18 DIAGNOSIS — E119 Type 2 diabetes mellitus without complications: Secondary | ICD-10-CM | POA: Diagnosis not present

## 2023-01-18 DIAGNOSIS — Z794 Long term (current) use of insulin: Secondary | ICD-10-CM | POA: Diagnosis not present

## 2023-01-20 ENCOUNTER — Ambulatory Visit: Payer: Self-pay | Admitting: *Deleted

## 2023-01-20 DIAGNOSIS — H353134 Nonexudative age-related macular degeneration, bilateral, advanced atrophic with subfoveal involvement: Secondary | ICD-10-CM | POA: Diagnosis not present

## 2023-01-20 DIAGNOSIS — H353211 Exudative age-related macular degeneration, right eye, with active choroidal neovascularization: Secondary | ICD-10-CM | POA: Diagnosis not present

## 2023-01-20 DIAGNOSIS — E113553 Type 2 diabetes mellitus with stable proliferative diabetic retinopathy, bilateral: Secondary | ICD-10-CM | POA: Diagnosis not present

## 2023-01-20 DIAGNOSIS — Z961 Presence of intraocular lens: Secondary | ICD-10-CM | POA: Diagnosis not present

## 2023-01-20 NOTE — Patient Outreach (Signed)
  Care Coordination   Follow Up Visit Note   01/20/2023 Name: Jill Hudson MRN: [id number] DOB: 1935-03-12  Jill Hudson is a 88 y.o. year old female who sees [name], MD for primary care. I spoke with Jill Hudson, daughter of Jill Hudson by phone today.  What matters to the patients health and wellness today?  Was seen in ED on 3/24 with difficulty breathing, diagnosed with COPD exacerbation.  Daughter report patient is doing much better now.     Goals Addressed             This Visit's Progress    RNCM: Effective Management of DM   On track    Care Coordination Interventions:  Provided education to patient about basic DM disease process. The patient states her blood sugars are slightly elevated due to short term steroid use.  Yesterday was 278 and today was 436 Reviewed medications with patient and discussed importance of medication adherence.  Taking prednisone and antibiotics due to COPD exacerbation Counseled on importance of regular laboratory monitoring as prescribed. The patient has regular lab work, last A1C was 7.1 Discussed plans with patient for ongoing care management follow up and provided patient with direct contact information for care management team Provided patient with written educational materials related to hypo and hyperglycemia and importance of correct treatment. Review and education.  Reviewed scheduled/upcoming provider appointments including: endocrinology on 4/12 and podiatry on 4/22.  Will have eye exam today Advised patient, providing education and rationale, to check cbg QID and record, calling pcp or endocrinologist  for findings outside established parameters. Continuous reader.  Review of patient status, including review of consultants reports, relevant laboratory and other test results, and medications completed           SDOH assessments and interventions completed:  No{THN Tip this will not be part of the note when signed-REQUIRED REPORT  FIELD DO NOT DELETE (Optional):27901}     Care Coordination Interventions:  Yes, provided {THN Tip this will not be part of the note when signed-REQUIRED REPORT FIELD DO NOT DELETE (Optional):27901}  Interventions Today    Flowsheet Row Most Recent Value  Chronic Disease   Chronic disease during today's visit Diabetes  General Interventions   General Interventions Discussed/Reviewed General Interventions Reviewed, Labs, Doctor Visits, Annual Foot Exam, Annual Eye Exam  Labs Hgb A1c every 3 months  Doctor Visits Discussed/Reviewed Doctor Visits Reviewed, Specialist  PCP/Specialist Visits Compliance with follow-up visit        Follow up plan: Follow up call scheduled for 4/24    Encounter Outcome:  Pt. Visit Completed {THN Tip this will not be part of the note when signed-REQUIRED REPORT FIELD DO NOT DELETE (Optional):27901}  Valente David, RN, MSN, [place] Management Care Management Coordinator [phone]

## 2023-02-02 ENCOUNTER — Telehealth (INDEPENDENT_AMBULATORY_CARE_PROVIDER_SITE_OTHER): Payer: Self-pay | Admitting: Nurse Practitioner

## 2023-02-02 NOTE — Telephone Encounter (Signed)
Patients daughter called in stating that patient is retaining a lot more fluid in the left leg where stint was placed and she states its been sore since she had the surgery as well the leg is swollen and big at the ankle.the leg is warm to touch, with some indention as well.  Right leg is puffy but not as bad as the left     Please call and advise

## 2023-02-03 NOTE — Telephone Encounter (Signed)
She hasn't had any stents placed by [place] since 2019.  Can we call and perhaps get a little more detail, such as when did the swelling start? Is there pain associated wih walking? Has she tried anything that helps, etc.

## 2023-02-03 NOTE — Telephone Encounter (Signed)
Spoke with patient daughter and she informed that her mother leg swelling has been going on for couple weeks. Patient does not have pain while walking but when she presses on her leg she feel pain. Patient daughter has encourage her mother to elevate and also she has taking tylenol for pain.

## 2023-02-04 ENCOUNTER — Other Ambulatory Visit (INDEPENDENT_AMBULATORY_CARE_PROVIDER_SITE_OTHER): Payer: Self-pay | Admitting: Nurse Practitioner

## 2023-02-04 DIAGNOSIS — M7989 Other specified soft tissue disorders: Secondary | ICD-10-CM

## 2023-02-04 DIAGNOSIS — I739 Peripheral vascular disease, unspecified: Secondary | ICD-10-CM

## 2023-02-04 NOTE — Telephone Encounter (Signed)
She can come in with ABIs and a RLE reflux study to see me or Schnier

## 2023-02-11 DIAGNOSIS — H353134 Nonexudative age-related macular degeneration, bilateral, advanced atrophic with subfoveal involvement: Secondary | ICD-10-CM | POA: Diagnosis not present

## 2023-02-11 DIAGNOSIS — H53413 Scotoma involving central area, bilateral: Secondary | ICD-10-CM | POA: Diagnosis not present

## 2023-02-12 ENCOUNTER — Ambulatory Visit (INDEPENDENT_AMBULATORY_CARE_PROVIDER_SITE_OTHER): Payer: Medicare HMO | Admitting: Nurse Practitioner

## 2023-02-12 ENCOUNTER — Ambulatory Visit (INDEPENDENT_AMBULATORY_CARE_PROVIDER_SITE_OTHER): Payer: Medicare HMO

## 2023-02-12 ENCOUNTER — Encounter (INDEPENDENT_AMBULATORY_CARE_PROVIDER_SITE_OTHER): Payer: Self-pay | Admitting: Nurse Practitioner

## 2023-02-12 VITALS — BP 157/68 | HR 69 | Resp 16 | Wt 123.0 lb

## 2023-02-12 DIAGNOSIS — M7989 Other specified soft tissue disorders: Secondary | ICD-10-CM

## 2023-02-12 DIAGNOSIS — H353231 Exudative age-related macular degeneration, bilateral, with active choroidal neovascularization: Secondary | ICD-10-CM | POA: Diagnosis not present

## 2023-02-12 DIAGNOSIS — H353221 Exudative age-related macular degeneration, left eye, with active choroidal neovascularization: Secondary | ICD-10-CM | POA: Diagnosis not present

## 2023-02-12 DIAGNOSIS — M79662 Pain in left lower leg: Secondary | ICD-10-CM | POA: Diagnosis not present

## 2023-02-12 DIAGNOSIS — E113213 Type 2 diabetes mellitus with mild nonproliferative diabetic retinopathy with macular edema, bilateral: Secondary | ICD-10-CM | POA: Diagnosis not present

## 2023-02-12 DIAGNOSIS — Z9889 Other specified postprocedural states: Secondary | ICD-10-CM

## 2023-02-12 DIAGNOSIS — H353211 Exudative age-related macular degeneration, right eye, with active choroidal neovascularization: Secondary | ICD-10-CM | POA: Diagnosis not present

## 2023-02-12 DIAGNOSIS — Z961 Presence of intraocular lens: Secondary | ICD-10-CM | POA: Diagnosis not present

## 2023-02-12 DIAGNOSIS — I739 Peripheral vascular disease, unspecified: Secondary | ICD-10-CM | POA: Diagnosis not present

## 2023-02-12 DIAGNOSIS — I6523 Occlusion and stenosis of bilateral carotid arteries: Secondary | ICD-10-CM | POA: Diagnosis not present

## 2023-02-12 NOTE — Progress Notes (Signed)
Subjective:    Patient ID: Jill Hudson, female    DOB: Apr 15, 1935, 88 y.o.   MRN: [id number] Chief Complaint  Patient presents with   Follow-up    Ultrasound follow up    Jill Hudson is an 88 year old female who presents today after her daughter contacted our office due to concern for some swelling in her lower extremities.  She notes that the left is worse than the right.  Initially the swelling was about equal.  There was certainly concerned due to the patient's history of peripheral arterial disease.  The patient has had stents in her bilateral lower extremities with the most recent left intervention in 2015.  She currently denies any open wounds or ulcerations.  There is no claudication-like symptoms or rest pain.  The patient does have a previous medical history of chronic kidney disease, atrial fibrillation and heart failure.  She denies any weeping of the lower extremities.  Today noninvasive studies show no DVT in the left lower extremity with no evidence of deep venous insufficiency or superficial venous reflux.  The right ABI is noncompressible with an ABI 0.83 on the left.  Previous study shows noncompressible ABIs on the right with a left ABI 0.92.  Patient continues to have strong biphasic tibial artery waveforms bilaterally with good toe waveforms bilaterally.    Review of Systems  Cardiovascular:  Positive for leg swelling.  All other systems reviewed and are negative.      Objective:   Physical Exam Vitals reviewed.  HENT:     Head: Normocephalic.  Cardiovascular:     Rate and Rhythm: Normal rate.  Pulmonary:     Effort: Pulmonary effort is normal.  Musculoskeletal:     Right lower leg: 1+ Edema present.     Left lower leg: 2+ Edema present.  Skin:    General: Skin is warm and dry.  Neurological:     Mental Status: She is alert and oriented to person, place, and time.     Gait: Gait abnormal.  Psychiatric:        Mood and Affect: Mood normal.         Behavior: Behavior normal.        Thought Content: Thought content normal.        Judgment: Judgment normal.     BP (!) 157/68 (BP Location: Left Arm)   Pulse 69   Resp 16   Wt 123 lb (55.8 kg)   BMI 23.24 kg/m   Past Medical History:  Diagnosis Date   Acute CHF (congestive heart failure) 02/18/2020   Acute diastolic CHF (congestive heart failure)    Acute kidney injury superimposed on CKD    Allergies    Anxiety    Arthritis    spine and shoulder   Atherosclerosis of artery of extremity with rest pain 10/12/2018   Cancer    skin   CHF (congestive heart failure)    Diabetes mellitus without complication    Diverticulosis of large intestine with hemorrhage    GI bleeding 12/25/2019   Heart murmur    Hyperlipidemia    Hypertension    Macula lutea degeneration    Mitral and aortic valve disease    Myocardial infarction    may have had a "light" heart attack   Non-ST elevation (NSTEMI) myocardial infarction    Occasional tremors    PAD (peripheral artery disease)    Rectal bleeding    Shingles    patient unaware but daughter confirms.  it was a long time ago   Stroke 01/2017   may have had a slight stroke   TIA (transient ischemic attack) 01/2017   UTI (urinary tract infection)    Vascular disease, peripheral     Social History   Socioeconomic History   Marital status: Widowed    Spouse name: Jill Hudson   Number of children: 3   Years of education: Not on file   Highest education level: 12th grade  Occupational History   Occupation: Retired    Comment: homemaker  Tobacco Use   Smoking status: Former    Packs/day: 2.00    Years: 37.00    Additional pack years: 0.00    Total pack years: 74.00    Types: Cigarettes    Quit date: 1980    Years since quitting: 44.3   Smokeless tobacco: Never   Tobacco comments:    smoking cessation materials not required  Vaping Use   Vaping Use: Never used  Substance and Sexual Activity   Alcohol use: No    Alcohol/week: 0.0  standard drinks of alcohol   Drug use: No   Sexual activity: Not Currently  Other Topics Concern   Not on file  Social History Narrative   Husband has dementia and may wander. Requires constant supervision. Pt's daughter also lives with them. Her son [name] is a Insurance account manager.    Social Determinants of Health   Financial Resource Strain: Low Risk  (01/01/2023)   Overall Financial Resource Strain (CARDIA)    Difficulty of Paying Living Expenses: Not hard at all  Food Insecurity: No Food Insecurity (01/01/2023)   Hunger Vital Sign    Worried About Running Out of Food in the Last Year: Never true    Ran Out of Food in the Last Year: Never true  Transportation Needs: No Transportation Needs (01/01/2023)   PRAPARE - Administrator, Civil Service (Medical): No    Lack of Transportation (Non-Medical): No  Physical Activity: Insufficiently Active (03/11/2022)   Exercise Vital Sign    Days of Exercise per Week: 3 days    Minutes of Exercise per Session: 10 min  Stress: No Stress Concern Present (03/11/2022)   [employer] of Occupational Health - Occupational Stress Questionnaire    Feeling of Stress : Not at all  Social Connections: Socially Integrated (05/12/2022)   Social Connection and Isolation Panel [NHANES]    Frequency of Communication with Friends and Family: More than three times a week    Frequency of Social Gatherings with Friends and Family: More than three times a week    Attends Religious Services: More than 4 times per year    Active Member of [employer] or Organizations: Yes    Attends Engineer, structural: More than 4 times per year    Marital Status: Married  Catering manager Violence: Not At Risk (01/01/2023)   Humiliation, Afraid, Rape, and Kick questionnaire    Fear of Current or Ex-Partner: No    Emotionally Abused: No    Physically Abused: No    Sexually Abused: No    Past Surgical History:  Procedure Laterality Date   CARDIAC CATHETERIZATION  1998    40% LM, 95% Ramus interm   CATARACT EXTRACTION, BILATERAL     COLONOSCOPY WITH PROPOFOL N/A 08/12/2018   Procedure: COLONOSCOPY WITH PROPOFOL;  Surgeon: Midge Minium, MD;  Location: [facility];  Service: Endoscopy;  Laterality: N/A;   ENDARTERECTOMY FEMORAL Left 10/12/2018   Procedure: ENDARTERECTOMY FEMORAL;  Surgeon:  Schnier, Latina Craver, MD;  Location: [facility] OR[facility];  Service: Vascular;  Laterality: Left;  angioplasty and left SFA stent placement   EYE SURGERY Bilateral    cataract extractions   LOWER EXTREMITY ANGIOGRAPHY Left 08/23/2017   Procedure: Lower Extremity Angiography;  Surgeon: Annice Needy, MD;  Location: [facility];  Service: Cardiovascular;  Laterality: Left;   LOWER EXTREMITY ANGIOGRAPHY Left 07/26/2018   Procedure: LOWER EXTREMITY ANGIOGRAPHY;  Surgeon: Renford Dills, MD;  Location: [facility];  Service: Cardiovascular;  Laterality: Left;   LOWER EXTREMITY ANGIOGRAPHY Left 09/16/2018   Procedure: LOWER EXTREMITY ANGIOGRAPHY;  Surgeon: Renford Dills, MD;  Location: [facility];  Service: Cardiovascular;  Laterality: Left;   PTCA  08/2013   Left common iliac   PTCA  12/2012   left ext iliac   RIGHT HEART CATH N/A 02/23/2020   Procedure: RIGHT HEART CATH;  Surgeon: Antonieta Iba, MD;  Location: [facility];  Service: Cardiovascular;  Laterality: N/A;   TUBAL LIGATION      Family History  Problem Relation Age of Onset   Dementia Mother    Diabetes Father     Allergies  Allergen Reactions   Cefdinir Diarrhea   Saxagliptin Diarrhea   Epinephrine Other (See Comments)    Patient does not remember what happens when she uses this   Evolocumab     Pain with injection   Atorvastatin Other (See Comments)    Muscle aches   Codeine Other (See Comments)    Upset stomach   Ezetimibe Other (See Comments)    Myalgias(ZETIA)   Limonene Rash    Patient does not recall this reaction   Nitrofurantoin Rash and Other (See  Comments)    Pruitus   Sulfa Antibiotics Rash and Other (See Comments)    Sore mouth        Latest Ref Rng & Units 03/24/2022   10:53 AM 12/25/2020    2:27 PM 07/20/2020    5:27 PM  CBC  WBC 3.4 - 10.8 x10E3/uL 7.9  8.3  8.1   Hemoglobin 11.1 - 15.9 g/dL 16.1  09.6  04.5   Hematocrit 34.0 - 46.6 % 33.0  33.3  36.9   Platelets 150 - 450 x10E3/uL 273  251  228       CMP     Component Value Date/Time   NA 137 03/24/2022 1053   NA 138 05/29/2014 1202   K 4.2 03/24/2022 1053   K 5.1 05/29/2014 1202   CL 98 03/24/2022 1053   CL 106 05/29/2014 1202   CO2 22 03/24/2022 1053   CO2 25 05/29/2014 1202   GLUCOSE 154 (H) 03/24/2022 1053   GLUCOSE 206 (H) 07/20/2020 1727   GLUCOSE 269 (H) 05/29/2014 1202   BUN 33 (H) 03/24/2022 1053   BUN 19 (H) 05/29/2014 1202   CREATININE 1.35 (H) 03/24/2022 1053   CREATININE 1.10 05/29/2014 1202   CALCIUM 9.6 03/24/2022 1053   CALCIUM 9.1 05/29/2014 1202   PROT 6.4 03/24/2022 1053   PROT 6.7 05/03/2013 1805   ALBUMIN 4.3 03/24/2022 1053   ALBUMIN 3.7 05/03/2013 1805   AST 21 03/24/2022 1053   AST 24 05/03/2013 1805   ALT 19 03/24/2022 1053   ALT 20 05/03/2013 1805   ALKPHOS 96 03/24/2022 1053   ALKPHOS 69 05/03/2013 1805   BILITOT 0.3 03/24/2022 1053   BILITOT 0.2 05/03/2013 1805   GFRNONAA 35 08/19/2021 0000   GFRNONAA 48 (  L) 05/29/2014 1202   GFRAA 39 (L) 07/20/2020 1727   GFRAA 56 (L) 05/29/2014 1202     No results found.     Assessment & Plan:   1. Peripheral arterial disease with history of revascularization Today there is a slight decrease in perfusion in the patient's left lower extremity but not significant.  Currently no role for intervention.  Patient to continue with Eliquis and statin.  2. Bilateral carotid artery stenosis Patient studies were stable in August.  Will plan on having patient return in 6 months to reevaluate studies.  3. Leg swelling Discussion with the patient and her daughter in regards to lower  extremity edema.  Today the patient had no evidence of venous insufficiency or DVT he in the left lower extremity.  I discussed with the patient that I suspect her swelling is likely multifactorial in nature or given her atrial fibrillation, chronic kidney disease and heart failure.  We discussed starting with conservative management including use of medical grade compression stockings.  They should be placed on daily and not slept in.  The patient should also elevate her lower extremity when she is not active as well as ambulate.   Current Outpatient Medications on File Prior to Visit  Medication Sig Dispense Refill   acetaminophen (TYLENOL) 650 MG CR tablet Take 1,300 mg by mouth every 8 (eight) hours as needed for pain.     albuterol (VENTOLIN HFA) 108 (90 Base) MCG/ACT inhaler Inhale 2 puffs into the lungs every 6 (six) hours as needed for wheezing or shortness of breath. 1 each 5   amLODipine (NORVASC) 5 MG tablet Take 5 mg by mouth in the morning and at bedtime.     apixaban (ELIQUIS) 2.5 MG TABS tablet TAKE 1 TABLET(2.5 MG) BY MOUTH TWICE DAILY 60 tablet 5   cloNIDine (CATAPRES) 0.1 MG tablet Take 0.1 mg by mouth 2 (two) times daily as needed.     Cyanocobalamin 1000 MCG TBCR Take 1,000 mcg by mouth daily.     feeding supplement, GLUCERNA SHAKE, (GLUCERNA SHAKE) LIQD Take 237 mLs by mouth 3 (three) times daily between meals. 21330 mL 0   ferrous sulfate 325 (65 FE) MG tablet Take 325 mg by mouth daily with breakfast.     Fluticasone Furoate (ARNUITY ELLIPTA) 100 MCG/ACT AEPB Inhale 1 Inhaler into the lungs daily at 2 PM. 30 each 0   gabapentin (NEURONTIN) 100 MG capsule TAKE 1 CAPSULE(100 MG) BY MOUTH AT BEDTIME 90 capsule 1   insulin glargine (LANTUS) 100 UNIT/ML injection Inject 0.05 mLs (5 Units total) into the skin daily. 10 mL 5   Insulin Lispro w/ Trans Port 100 UNIT/ML SOPN 3 Units daily.     Insulin Pen Needle (B-D ULTRAFINE III SHORT PEN) 31G X 8 MM MISC USE AS DIRECTED 100 each 3    ipratropium (ATROVENT HFA) 17 MCG/ACT inhaler Inhale 2 puffs into the lungs at bedtime.     losartan (COZAAR) 25 MG tablet Take 25 mg by mouth 2 (two) times daily. Unsure of dosage     melatonin 5 MG TABS Take 0.5 tablets (2.5 mg total) by mouth at bedtime as needed (sleep). 30 tablet 0   Multiple Vitamins-Minerals (PRESERVISION AREDS PO) Take 1 capsule by mouth 2 (two) times daily.      promethazine-dextromethorphan (PROMETHAZINE-DM) 6.25-15 MG/5ML syrup TAKE 5 ML BY MOUTH EVERY NIGHT AT BEDTIME 180 mL 0   rosuvastatin (CRESTOR) 10 MG tablet Take 1 tablet (10 mg total) by mouth 4 (  four) times a week. Monday, Wed, Friday and Saturday 20 tablet 5   torsemide (DEMADEX) 20 MG tablet Take 2 tablets (40 mg total) by mouth daily. (Patient taking differently: Take 40 mg by mouth 2 (two) times daily. 1/2 tablet morning and 1/2 tablet at night.) 30 tablet 0   No current facility-administered medications on file prior to visit.    There are no Patient Instructions on file for this visit. No follow-ups on file.   Georgiana Spinner, NP

## 2023-02-15 DIAGNOSIS — Z794 Long term (current) use of insulin: Secondary | ICD-10-CM | POA: Diagnosis not present

## 2023-02-15 DIAGNOSIS — E1142 Type 2 diabetes mellitus with diabetic polyneuropathy: Secondary | ICD-10-CM | POA: Diagnosis not present

## 2023-02-15 DIAGNOSIS — M79675 Pain in left toe(s): Secondary | ICD-10-CM | POA: Diagnosis not present

## 2023-02-15 DIAGNOSIS — M79674 Pain in right toe(s): Secondary | ICD-10-CM | POA: Diagnosis not present

## 2023-02-15 DIAGNOSIS — B351 Tinea unguium: Secondary | ICD-10-CM | POA: Diagnosis not present

## 2023-02-17 ENCOUNTER — Ambulatory Visit: Payer: Self-pay | Admitting: *Deleted

## 2023-02-17 DIAGNOSIS — E1159 Type 2 diabetes mellitus with other circulatory complications: Secondary | ICD-10-CM | POA: Diagnosis not present

## 2023-02-17 DIAGNOSIS — E1149 Type 2 diabetes mellitus with other diabetic neurological complication: Secondary | ICD-10-CM | POA: Diagnosis not present

## 2023-02-17 DIAGNOSIS — E1142 Type 2 diabetes mellitus with diabetic polyneuropathy: Secondary | ICD-10-CM | POA: Diagnosis not present

## 2023-02-17 DIAGNOSIS — E1169 Type 2 diabetes mellitus with other specified complication: Secondary | ICD-10-CM | POA: Diagnosis not present

## 2023-02-17 DIAGNOSIS — Z794 Long term (current) use of insulin: Secondary | ICD-10-CM | POA: Diagnosis not present

## 2023-02-17 NOTE — Patient Outreach (Signed)
  Care Coordination   Follow Up Visit Note   02/17/2023 Name: Jill Hudson MRN: [id number] DOB: 06/03/1935  Jill Hudson is a 88 y.o. year old female who sees Jill Milan, MD for primary care. I spoke with Jill Hudson, daughter of MERCEDES Hudson by phone today.  What matters to the patients health and wellness today?  Per daughter, keep blood sugars and swelling under control.    Goals Addressed             This Visit's Progress    RNCM: Effective Management of DM   On track    Care Coordination Interventions:  Provided education to patient about basic DM disease process.  Reviewed medications with patient and discussed importance of medication adherence.   Counseled on importance of regular laboratory monitoring as prescribed. The patient has regular lab work, last A1C was 7.1 Discussed plans with patient for ongoing care management follow up and provided patient with direct contact information for care management team Provided patient with written educational materials related to hypo and hyperglycemia and importance of correct treatment. Review and education.  Reviewed scheduled/upcoming provider appointments including: endocrinology changed to 5/6 and podiatry on 4/22.  Advised patient, providing education and rationale, to check cbg QID and record, calling pcp or endocrinologist  for findings outside established parameters. Continuous reader.  Review of patient status, including review of consultants reports, relevant laboratory and other test results, and medications completed           SDOH assessments and interventions completed:  No     Care Coordination Interventions:  Yes, provided   Interventions Today    Flowsheet Row Most Recent Value  Chronic Disease   Chronic disease during today's visit Diabetes  General Interventions   General Interventions Discussed/Reviewed General Interventions Reviewed, Annual Eye Exam, Annual Foot Exam, Labs, Doctor Visits  Labs  Hgb A1c every 6 months  [due now, will have on 5/6]  Doctor Visits Discussed/Reviewed Doctor Visits Reviewed, PCP, Specialist  [Annual eye exam done last month, endocrine on 5/6,]  PCP/Specialist Visits Compliance with follow-up visit  Exercise Interventions   Exercise Discussed/Reviewed Weight Managment  Weight Management Weight maintenance  [discussed daily weights, today 123 pounds, has some swelling.  Elevating legs and wearing compression socks.  Will talk to provider about increasing diuretics if this does not work]  Education Interventions   Education Provided Provided Education  Provided Verbal Education On Nutrition, Foot Care, Eye Care, Labs, Blood Sugar Monitoring, Medication, When to see the doctor  [blood sugars have been less then 200]  Labs Reviewed Hgb A1c  [currently 7.1]       Follow up plan: Follow up call scheduled for 5/30    Encounter Outcome:  Pt. Visit Completed   Kemper Durie, RN, MSN, [facility] Care Management Care Management Coordinator [phone]

## 2023-02-18 ENCOUNTER — Ambulatory Visit: Payer: Self-pay

## 2023-02-18 NOTE — Telephone Encounter (Signed)
  Chief Complaint: SOB Symptoms: SOB with activity, cough Frequency: ongoing 1-2 months  Pertinent Negatives: NA Disposition: ED /[] Urgent Care (no appt availability in office) / Appointment(In office/virtual)/  [place] [facility]/ Home Care/ Refused Recommended Disposition /[] [place] [facility]/  Follow-up with PCP Additional Notes: pt's daughter calling to report SOB with activity but can be light activity. Had ED visit on 01/17/23 for COPD exac, hadn't had Hosp FU but daughter states when pt comes in for OV she denies having SOB. Using Atrovent inhaler as well. Scheduled OV tomorrow at 1340 with PCP.   Reason for Disposition  [1] MILD difficulty breathing (e.g., minimal/no SOB at rest, SOB with walking, pulse <100) AND [2] NEW-onset or WORSE than normal  Answer Assessment - Initial Assessment Questions 1. RESPIRATORY STATUS: "Describe your breathing?" (e.g., wheezing, shortness of breath, unable to speak, severe coughing)      SOB 2. ONSET: "When did this breathing problem begin?"      1-2 months  3. PATTERN "Does the difficult breathing come and go, or has it been constant since it started?"      Comes and goes  4. SEVERITY: "How bad is your breathing?" (e.g., mild, moderate, severe)    - MILD: No SOB at rest, mild SOB with walking, speaks normally in sentences, can lie down, no retractions, pulse < 100.    - MODERATE: SOB at rest, SOB with minimal exertion and prefers to sit, cannot lie down flat, speaks in phrases, mild retractions, audible wheezing, pulse 100-120.    - SEVERE: Very SOB at rest, speaks in single words, struggling to breathe, sitting hunched forward, retractions, pulse > 120      With activity  7. LUNG HISTORY: "Do you have any history of lung disease?"  (e.g., pulmonary embolus, asthma, emphysema)     COPD 9. OTHER SYMPTOMS: "Do you have any other symptoms? (e.g., dizziness, runny nose, cough, chest pain, fever)     cough  Protocols used:  Breathing Difficulty-A-AH

## 2023-02-19 ENCOUNTER — Ambulatory Visit (INDEPENDENT_AMBULATORY_CARE_PROVIDER_SITE_OTHER): Payer: Medicare HMO | Admitting: Internal Medicine

## 2023-02-19 ENCOUNTER — Encounter: Payer: Self-pay | Admitting: Internal Medicine

## 2023-02-19 VITALS — BP 134/62 | HR 71 | Ht 61.0 in | Wt 122.0 lb

## 2023-02-19 DIAGNOSIS — J4489 Other specified chronic obstructive pulmonary disease: Secondary | ICD-10-CM | POA: Diagnosis not present

## 2023-02-19 MED ORDER — IPRATROPIUM-ALBUTEROL 0.5-2.5 (3) MG/3ML IN SOLN: 3.0000 mL | Freq: Four times a day (QID) | RESPIRATORY_TRACT | 0 refills | Status: DC | PRN

## 2023-02-19 MED ORDER — TRELEGY ELLIPTA 100-62.5-25 MCG/ACT IN AEPB: 1.0000 | INHALATION_SPRAY | Freq: Every day | RESPIRATORY_TRACT | 11 refills | Status: DC

## 2023-02-19 NOTE — Assessment & Plan Note (Addendum)
On Steroid and beta agonist daily Chronic cough suppressed at night with phenergan/dextromethorphan Has not consulted Pulmonology Will stop Ipratropium Start Trelegy daily and duonebs qid with Rx for nebulizer

## 2023-02-19 NOTE — Progress Notes (Signed)
Date:  02/19/2023   Name:  Jill Hudson   DOB:  08/28/1935   MRN:  [id number]   Chief Complaint: Cough (Cough with SOB. Patient has been coughing for several months and wants to discuss her COPD.)  Shortness of Breath This is a chronic problem. The problem occurs daily. The problem has been unchanged. Associated symptoms include claudication, sputum production and wheezing. Pertinent negatives include no chest pain, fever, headaches, hemoptysis or vomiting. Her past medical history is significant for COPD.    Lab Results  Component Value Date   NA 137 03/24/2022   K 4.2 03/24/2022   CO2 22 03/24/2022   GLUCOSE 154 (H) 03/24/2022   BUN 33 (H) 03/24/2022   CREATININE 1.35 (H) 03/24/2022   CALCIUM 9.6 03/24/2022   EGFR 39 (L) 03/24/2022   GFRNONAA 35 08/19/2021   Lab Results  Component Value Date   CHOL 179 03/24/2022   HDL 48 03/24/2022   LDLCALC 98 03/24/2022   TRIG 191 (H) 03/24/2022   CHOLHDL 3.7 03/24/2022   Lab Results  Component Value Date   TSH 5.490 (H) 03/24/2022   Lab Results  Component Value Date   HGBA1C 7.1 09/04/2022   Lab Results  Component Value Date   WBC 7.9 03/24/2022   HGB 11.2 03/24/2022   HCT 33.0 (L) 03/24/2022   MCV 91 03/24/2022   PLT 273 03/24/2022   Lab Results  Component Value Date   ALT 19 03/24/2022   AST 21 03/24/2022   ALKPHOS 96 03/24/2022   BILITOT 0.3 03/24/2022   No results found for: "25OHVITD2", "25OHVITD3", "VD25OH"   Review of Systems  Constitutional:  Negative for chills, fatigue and fever.  HENT:  Negative for trouble swallowing.   Respiratory:  Positive for sputum production, shortness of breath and wheezing. Negative for hemoptysis.   Cardiovascular:  Positive for claudication. Negative for chest pain and palpitations.  Gastrointestinal:  Negative for vomiting.  Neurological:  Negative for headaches.  Psychiatric/Behavioral:  Positive for sleep disturbance. Negative for dysphoric mood. The patient is not  nervous/anxious.     Patient Active Problem List   Diagnosis Date Noted   Acquired thrombophilia (HCC) 03/20/2021   Mood disorder (HCC) 05/02/2020   Chronic heart failure with preserved ejection fraction (HCC) 04/12/2020   Diverticulosis of large intestine without perforation or abscess with bleeding 03/27/2020   AF (paroxysmal atrial fibrillation) (HCC)    COPD (chronic obstructive pulmonary disease) with chronic bronchitis    CKD (chronic kidney disease), stage IIIa 02/18/2020   Malnutrition of mild degree (HCC) 01/08/2020   Age-related macular degeneration, dry, left eye 06/21/2019   Age-related macular degeneration, wet, right eye (HCC) 06/21/2019   Moderate nonproliferative diabetic retinopathy associated with type 2 diabetes mellitus (HCC) 06/21/2019   Lumbosacral radiculopathy at L4 05/01/2019   Underweight 05/01/2019   Encounter for long-term (current) use of aspirin 10/31/2018   Long term current use of oral hypoglycemic drug 10/31/2018   Encounter for long-term (current) use of insulin (HCC) 10/31/2018   Peptic ulcer disease 08/11/2018   Leg pain 07/03/2017   Carpal tunnel syndrome on both sides 05/05/2017   Myalgia due to HMG CoA reductase inhibitor 05/05/2017   History of CVA (cerebrovascular accident) 02/15/2017   Degenerative disc disease, lumbar 12/21/2016   Atherosclerosis of native arteries of extremity with intermittent claudication (HCC) 12/20/2016   Bilateral carotid artery stenosis 12/20/2016   Occlusion and stenosis of bilateral carotid arteries 12/20/2016   Hip bursitis 05/18/2016  Elevated TSH 01/18/2016   CKD stage 3 due to type 2 diabetes mellitus (HCC) 01/16/2016   Senile ecchymosis 01/16/2016   Type II diabetes mellitus with complication (HCC)    GERD (gastroesophageal reflux disease) 07/24/2015   PVD (peripheral vascular disease) (HCC) 05/17/2015   Neoplasm of uncertain behavior of skin 05/17/2015   Retinopathy, diabetic, proliferative (HCC)  04/11/2015   Hyperlipidemia associated with type 2 diabetes mellitus (HCC) 04/11/2015   Essential hypertension 04/11/2015   Generalized OA 04/11/2015   Proliferative diabetic retinopathy(362.02) 04/11/2015   Arteriosclerosis of coronary artery 05/29/2013   Hypertensive heart disease without CHF 05/29/2013    Allergies  Allergen Reactions   Cefdinir Diarrhea   Saxagliptin Diarrhea   Epinephrine Other (See Comments)    Patient does not remember what happens when she uses this   Evolocumab     Pain with injection   Atorvastatin Other (See Comments)    Muscle aches   Codeine Other (See Comments)    Upset stomach   Ezetimibe Other (See Comments)    Myalgias(ZETIA)   Limonene Rash    Patient does not recall this reaction   Nitrofurantoin Rash and Other (See Comments)    Pruitus   Sulfa Antibiotics Rash and Other (See Comments)    Sore mouth     Past Surgical History:  Procedure Laterality Date   CARDIAC CATHETERIZATION  1998   40% LM, 95% Ramus interm   CATARACT EXTRACTION, BILATERAL     COLONOSCOPY WITH PROPOFOL N/A 08/12/2018   Procedure: COLONOSCOPY WITH PROPOFOL;  Surgeon: Midge Minium, MD;  Location: [facility] ENDOSCOPY;  Service: Endoscopy;  Laterality: N/A;   ENDARTERECTOMY FEMORAL Left 10/12/2018   Procedure: ENDARTERECTOMY FEMORAL;  Surgeon: Renford Dills, MD;  Location: [facility] ORS;  Service: Vascular;  Laterality: Left;  angioplasty and left SFA stent placement   EYE SURGERY Bilateral    cataract extractions   LOWER EXTREMITY ANGIOGRAPHY Left 08/23/2017   Procedure: Lower Extremity Angiography;  Surgeon: Annice Needy, MD;  Location: [facility] INVASIVE CV [facility];  Service: Cardiovascular;  Laterality: Left;   LOWER EXTREMITY ANGIOGRAPHY Left 07/26/2018   Procedure: LOWER EXTREMITY ANGIOGRAPHY;  Surgeon: Renford Dills, MD;  Location: [facility] CV [facility];  Service: Cardiovascular;  Laterality: Left;   LOWER EXTREMITY ANGIOGRAPHY Left 09/16/2018   Procedure: LOWER EXTREMITY  ANGIOGRAPHY;  Surgeon: Renford Dills, MD;  Location: [facility] CV LAB;  Service: Cardiovascular;  Laterality: Left;   PTCA  08/2013   Left common iliac   PTCA  12/2012   left ext iliac   RIGHT HEART CATH N/A 02/23/2020   Procedure: RIGHT HEART CATH;  Surgeon: Antonieta Iba, MD;  Location: [facility] INV[facility] CV LAB;  Service: Cardiovascular;  Laterality: N/A;   TUBAL LIGATION      Social History   Tobacco Use   Smoking status: Former    Packs/day: 2.00    Years: 20.00    Additional pack years: 0.00    Total pack years: 40.00    Types: Cigarettes    Quit date: 1980    Years since quitting: 44.3   Smokeless tobacco: Never   Tobacco comments:    smoking cessation materials not required  Vaping Use   Vaping Use: Never used  Substance Use Topics   Alcohol use: No    Alcohol/week: 0.0 standard drinks of alcohol   Drug use: No     Medication list has been reviewed and updated.  Current Meds  Medication Sig   acetaminophen (TYLENOL)  650 MG CR tablet Take 1,300 mg by mouth every 8 (eight) hours as needed for pain.   albuterol (VENTOLIN HFA) 108 (90 Base) MCG/ACT inhaler Inhale 2 puffs into the lungs every 6 (six) hours as needed for wheezing or shortness of breath.   amLODipine (NORVASC) 5 MG tablet Take 5 mg by mouth in the morning and at bedtime.   apixaban (ELIQUIS) 2.5 MG TABS tablet TAKE 1 TABLET(2.5 MG) BY MOUTH TWICE DAILY   cloNIDine (CATAPRES) 0.1 MG tablet Take 0.1 mg by mouth 2 (two) times daily as needed.   Cyanocobalamin 1000 MCG TBCR Take 1,000 mcg by mouth daily.   feeding supplement, GLUCERNA SHAKE, (GLUCERNA SHAKE) LIQD Take 237 mLs by mouth 3 (three) times daily between meals.   ferrous sulfate 325 (65 FE) MG tablet Take 325 mg by mouth daily with breakfast.   Fluticasone-Umeclidin-Vilant (TRELEGY ELLIPTA) 100-62.5-25 MCG/ACT AEPB Inhale 1 puff into the lungs daily.   gabapentin (NEURONTIN) 100 MG capsule TAKE 1 CAPSULE(100 MG) BY MOUTH AT BEDTIME    insulin glargine (LANTUS) 100 UNIT/ML injection Inject 0.05 mLs (5 Units total) into the skin daily.   Insulin Lispro w/ Trans Port 100 UNIT/ML SOPN 3 Units daily. ( Humalog )   Insulin Pen Needle (B-D ULTRAFINE III SHORT PEN) 31G X 8 MM MISC USE AS DIRECTED   ipratropium-albuterol (DUONEB) 0.5-2.5 (3) MG/3ML SOLN Take 3 mLs by nebulization every 6 (six) hours as needed.   losartan (COZAAR) 25 MG tablet Take 25 mg by mouth 2 (two) times daily. Unsure of dosage   melatonin 5 MG TABS Take 0.5 tablets (2.5 mg total) by mouth at bedtime as needed (sleep).   Multiple Vitamins-Minerals (PRESERVISION AREDS PO) Take 1 capsule by mouth 2 (two) times daily.    promethazine-dextromethorphan (PROMETHAZINE-DM) 6.25-15 MG/5ML syrup TAKE 5 ML BY MOUTH EVERY NIGHT AT BEDTIME   rosuvastatin (CRESTOR) 10 MG tablet Take 1 tablet (10 mg total) by mouth 4 (four) times a week. Monday, Wed, Friday and Saturday   torsemide (DEMADEX) 20 MG tablet Take 2 tablets (40 mg total) by mouth daily. (Patient taking differently: Take 40 mg by mouth 2 (two) times daily. 1/2 tablet morning and 1/2 tablet at night.)   [DISCONTINUED] ipratropium (ATROVENT HFA) 17 MCG/ACT inhaler Inhale 2 puffs into the lungs at bedtime.       01/01/2023    3:09 PM 09/16/2022    2:10 PM 07/06/2022    2:15 PM 03/24/2022   10:17 AM  GAD 7 : Generalized Anxiety Score  Nervous, Anxious, on Edge 0 0 2 0  Control/stop worrying 0 0 1 0  Worry too much - different things 0 1 1 0  Trouble relaxing 1 0 2 0  Restless 0 0 1 0  Easily annoyed or irritable 1 0 1 1  Afraid - awful might happen 0 0 0 0  Total GAD 7 Score 2 1 8 1   Anxiety Difficulty Not difficult at all Not difficult at all Somewhat difficult Not difficult at all       01/01/2023    3:09 PM 09/16/2022    2:10 PM 07/06/2022    2:14 PM  Depression screen PHQ 2/9  Decreased Interest 2 2 2   Down, Depressed, Hopeless 2 1 3   PHQ - 2 Score 4 3 5   Altered sleeping 3 1 3   Tired, decreased energy 3  2 3   Change in appetite 1 1 1   Feeling bad or failure about yourself  1 1 3  Trouble concentrating 2 3 1   Moving slowly or fidgety/restless 2 1 1   Suicidal thoughts 1 1 1   PHQ-9 Score 17 13 18   Difficult doing work/chores Somewhat difficult Somewhat difficult Somewhat difficult    BP Readings from Last 3 Encounters:  02/19/23 134/62  02/12/23 (!) 157/68  01/01/23 100/62    Physical Exam Vitals and nursing note reviewed.  Constitutional:      General: She is not in acute distress.    Appearance: She is well-developed.  HENT:     Head: Normocephalic and atraumatic.  Neck:     Vascular: No carotid bruit.  Cardiovascular:     Rate and Rhythm: Normal rate and regular rhythm.  Pulmonary:     Effort: Pulmonary effort is normal. No respiratory distress.     Breath sounds: Decreased breath sounds present. No wheezing or rhonchi.  Musculoskeletal:     Cervical back: Normal range of motion.     Right lower leg: No edema.     Left lower leg: No edema.  Skin:    General: Skin is warm and dry.     Capillary Refill: Capillary refill takes less than 2 seconds.     Findings: No rash.  Neurological:     General: No focal deficit present.     Mental Status: She is alert and oriented to person, place, and time.  Psychiatric:        Mood and Affect: Mood normal.        Behavior: Behavior normal.     Wt Readings from Last 3 Encounters:  02/19/23 122 lb (55.3 kg)  02/12/23 123 lb (55.8 kg)  01/01/23 120 lb (54.4 kg)    BP 134/62   Pulse 71   Ht 5\' 1"  (1.549 m)   Wt 122 lb (55.3 kg)   SpO2 98%   BMI 23.05 kg/m   Assessment and Plan:  Problem List Items Addressed This Visit       Respiratory   COPD (chronic obstructive pulmonary disease) with chronic bronchitis - Primary (Chronic)    On Steroid and beta agonist daily Chronic cough suppressed at night with phenergan/dextromethorphan Has not consulted Pulmonology Will stop Ipratropium Start Trelegy daily and duonebs qid  with Rx for nebulizer       Relevant Medications   ipratropium-albuterol (DUONEB) 0.5-2.5 (3) MG/3ML SOLN   Fluticasone-Umeclidin-Vilant (TRELEGY ELLIPTA) 100-62.5-25 MCG/ACT AEPB    No follow-ups on file.   Partially dictated using Dragon software, any errors are not intentional.  Reubin Milan, MD [facility] [place], [place]

## 2023-02-19 NOTE — Patient Instructions (Signed)
-  It was a pleasure to see you today! Please review your visit summary for helpful information. -Lab results are usually available within 1-2 days and we will call once reviewed. -I would encourage you to follow your care via MyChart where you can access lab results, notes, messages, and more. -If you feel that we did a nice job today, please complete your after-visit survey and leave us a Google review! Your CMA today was Takiah Maiden and your provider was Dr Laura Berglund, MD.  

## 2023-02-22 LAB — VAS US ABI WITH/WO TBI
Left ABI: 0.83
Right ABI: >1

## 2023-03-01 DIAGNOSIS — Z794 Long term (current) use of insulin: Secondary | ICD-10-CM | POA: Diagnosis not present

## 2023-03-01 DIAGNOSIS — E1142 Type 2 diabetes mellitus with diabetic polyneuropathy: Secondary | ICD-10-CM | POA: Diagnosis not present

## 2023-03-19 ENCOUNTER — Other Ambulatory Visit: Payer: Self-pay | Admitting: Internal Medicine

## 2023-03-19 DIAGNOSIS — E1142 Type 2 diabetes mellitus with diabetic polyneuropathy: Secondary | ICD-10-CM | POA: Diagnosis not present

## 2023-03-19 DIAGNOSIS — Z794 Long term (current) use of insulin: Secondary | ICD-10-CM | POA: Diagnosis not present

## 2023-03-19 DIAGNOSIS — E1169 Type 2 diabetes mellitus with other specified complication: Secondary | ICD-10-CM | POA: Diagnosis not present

## 2023-03-19 DIAGNOSIS — J4489 Other specified chronic obstructive pulmonary disease: Secondary | ICD-10-CM

## 2023-03-19 DIAGNOSIS — E1159 Type 2 diabetes mellitus with other circulatory complications: Secondary | ICD-10-CM | POA: Diagnosis not present

## 2023-03-19 NOTE — Telephone Encounter (Signed)
Requested Prescriptions  Pending Prescriptions Disp Refills   promethazine-dextromethorphan (PROMETHAZINE-DM) 6.25-15 MG/5ML syrup [Pharmacy Med Name: PROMETHAZINE DM SYRUP] 180 mL 0    Sig: TAKE 5 ML BY MOUTH EVERY NIGHT AT BEDTIME     Ear, Nose, and Throat:  Antitussives/Expectorants Passed - 03/19/2023  8:32 AM      Passed - Valid encounter within last 12 months    Recent Outpatient Visits           4 weeks ago COPD (chronic obstructive pulmonary disease) with chronic bronchitis   [place] [facility] at [facility], Nyoka Cowden, MD   2 months ago Mixed hyperlipidemia   [place] [facility] at [facility], Nyoka Cowden, MD   6 months ago COPD (chronic obstructive pulmonary disease) with chronic bronchitis   [place] [facility] at [facility], Nyoka Cowden, MD   8 months ago COPD with exacerbation [facility])   [place] [facility] at [facility], Nyoka Cowden, MD   12 months ago Annual physical exam   [facility] at [facility], Nyoka Cowden, MD

## 2023-03-25 ENCOUNTER — Other Ambulatory Visit: Payer: Self-pay

## 2023-03-25 ENCOUNTER — Ambulatory Visit: Payer: Self-pay | Admitting: *Deleted

## 2023-03-25 ENCOUNTER — Emergency Department: Payer: Medicare HMO

## 2023-03-25 ENCOUNTER — Encounter: Payer: Self-pay | Admitting: Internal Medicine

## 2023-03-25 ENCOUNTER — Inpatient Hospital Stay
Admission: EM | Admit: 2023-03-25 | Discharge: 2023-04-20 | DRG: 280 | Disposition: A | Discharge status: Hospice | Payer: Medicare HMO | Attending: Internal Medicine | Admitting: Internal Medicine

## 2023-03-25 ENCOUNTER — Ambulatory Visit: Payer: Self-pay

## 2023-03-25 DIAGNOSIS — I700 Atherosclerosis of aorta: Secondary | ICD-10-CM | POA: Diagnosis not present

## 2023-03-25 DIAGNOSIS — E871 Hypo-osmolality and hyponatremia: Secondary | ICD-10-CM | POA: Diagnosis not present

## 2023-03-25 DIAGNOSIS — R338 Other retention of urine: Secondary | ICD-10-CM | POA: Insufficient documentation

## 2023-03-25 DIAGNOSIS — E1151 Type 2 diabetes mellitus with diabetic peripheral angiopathy without gangrene: Secondary | ICD-10-CM | POA: Diagnosis present

## 2023-03-25 DIAGNOSIS — I214 Non-ST elevation (NSTEMI) myocardial infarction: Secondary | ICD-10-CM | POA: Diagnosis not present

## 2023-03-25 DIAGNOSIS — I4891 Unspecified atrial fibrillation: Secondary | ICD-10-CM | POA: Diagnosis present

## 2023-03-25 DIAGNOSIS — Z79899 Other long term (current) drug therapy: Secondary | ICD-10-CM

## 2023-03-25 DIAGNOSIS — J440 Chronic obstructive pulmonary disease with acute lower respiratory infection: Secondary | ICD-10-CM | POA: Diagnosis present

## 2023-03-25 DIAGNOSIS — I10 Essential (primary) hypertension: Secondary | ICD-10-CM | POA: Diagnosis not present

## 2023-03-25 DIAGNOSIS — J441 Chronic obstructive pulmonary disease with (acute) exacerbation: Secondary | ICD-10-CM

## 2023-03-25 DIAGNOSIS — Z1152 Encounter for screening for COVID-19: Secondary | ICD-10-CM | POA: Diagnosis not present

## 2023-03-25 DIAGNOSIS — N179 Acute kidney failure, unspecified: Secondary | ICD-10-CM | POA: Diagnosis not present

## 2023-03-25 DIAGNOSIS — Z66 Do not resuscitate: Secondary | ICD-10-CM | POA: Diagnosis not present

## 2023-03-25 DIAGNOSIS — R062 Wheezing: Secondary | ICD-10-CM | POA: Diagnosis not present

## 2023-03-25 DIAGNOSIS — R0689 Other abnormalities of breathing: Secondary | ICD-10-CM | POA: Diagnosis not present

## 2023-03-25 DIAGNOSIS — I340 Nonrheumatic mitral (valve) insufficiency: Secondary | ICD-10-CM | POA: Diagnosis not present

## 2023-03-25 DIAGNOSIS — Z833 Family history of diabetes mellitus: Secondary | ICD-10-CM

## 2023-03-25 DIAGNOSIS — I509 Heart failure, unspecified: Secondary | ICD-10-CM | POA: Diagnosis not present

## 2023-03-25 DIAGNOSIS — E1165 Type 2 diabetes mellitus with hyperglycemia: Secondary | ICD-10-CM | POA: Diagnosis not present

## 2023-03-25 DIAGNOSIS — D631 Anemia in chronic kidney disease: Secondary | ICD-10-CM | POA: Diagnosis present

## 2023-03-25 DIAGNOSIS — E875 Hyperkalemia: Secondary | ICD-10-CM | POA: Diagnosis not present

## 2023-03-25 DIAGNOSIS — E876 Hypokalemia: Secondary | ICD-10-CM | POA: Insufficient documentation

## 2023-03-25 DIAGNOSIS — N1832 Chronic kidney disease, stage 3b: Secondary | ICD-10-CM | POA: Diagnosis not present

## 2023-03-25 DIAGNOSIS — E1122 Type 2 diabetes mellitus with diabetic chronic kidney disease: Secondary | ICD-10-CM | POA: Diagnosis present

## 2023-03-25 DIAGNOSIS — I480 Paroxysmal atrial fibrillation: Secondary | ICD-10-CM | POA: Diagnosis not present

## 2023-03-25 DIAGNOSIS — J9601 Acute respiratory failure with hypoxia: Secondary | ICD-10-CM | POA: Diagnosis not present

## 2023-03-25 DIAGNOSIS — Z8673 Personal history of transient ischemic attack (TIA), and cerebral infarction without residual deficits: Secondary | ICD-10-CM

## 2023-03-25 DIAGNOSIS — N184 Chronic kidney disease, stage 4 (severe): Secondary | ICD-10-CM | POA: Diagnosis not present

## 2023-03-25 DIAGNOSIS — D508 Other iron deficiency anemias: Secondary | ICD-10-CM | POA: Diagnosis not present

## 2023-03-25 DIAGNOSIS — Z7189 Other specified counseling: Secondary | ICD-10-CM | POA: Diagnosis not present

## 2023-03-25 DIAGNOSIS — R0603 Acute respiratory distress: Secondary | ICD-10-CM | POA: Diagnosis not present

## 2023-03-25 DIAGNOSIS — J189 Pneumonia, unspecified organism: Secondary | ICD-10-CM | POA: Diagnosis not present

## 2023-03-25 DIAGNOSIS — J811 Chronic pulmonary edema: Secondary | ICD-10-CM | POA: Diagnosis not present

## 2023-03-25 DIAGNOSIS — E869 Volume depletion, unspecified: Secondary | ICD-10-CM | POA: Diagnosis present

## 2023-03-25 DIAGNOSIS — N170 Acute kidney failure with tubular necrosis: Secondary | ICD-10-CM | POA: Diagnosis not present

## 2023-03-25 DIAGNOSIS — I130 Hypertensive heart and chronic kidney disease with heart failure and stage 1 through stage 4 chronic kidney disease, or unspecified chronic kidney disease: Secondary | ICD-10-CM | POA: Diagnosis not present

## 2023-03-25 DIAGNOSIS — J9811 Atelectasis: Secondary | ICD-10-CM | POA: Diagnosis not present

## 2023-03-25 DIAGNOSIS — Z87891 Personal history of nicotine dependence: Secondary | ICD-10-CM

## 2023-03-25 DIAGNOSIS — I5A Non-ischemic myocardial injury (non-traumatic): Secondary | ICD-10-CM

## 2023-03-25 DIAGNOSIS — R0902 Hypoxemia: Secondary | ICD-10-CM | POA: Diagnosis not present

## 2023-03-25 DIAGNOSIS — J439 Emphysema, unspecified: Secondary | ICD-10-CM | POA: Diagnosis not present

## 2023-03-25 DIAGNOSIS — I25118 Atherosclerotic heart disease of native coronary artery with other forms of angina pectoris: Secondary | ICD-10-CM | POA: Diagnosis not present

## 2023-03-25 DIAGNOSIS — N183 Chronic kidney disease, stage 3 unspecified: Secondary | ICD-10-CM | POA: Diagnosis not present

## 2023-03-25 DIAGNOSIS — Z820 Family history of epilepsy and other diseases of the nervous system: Secondary | ICD-10-CM

## 2023-03-25 DIAGNOSIS — R778 Other specified abnormalities of plasma proteins: Secondary | ICD-10-CM | POA: Diagnosis not present

## 2023-03-25 DIAGNOSIS — R570 Cardiogenic shock: Secondary | ICD-10-CM

## 2023-03-25 DIAGNOSIS — I739 Peripheral vascular disease, unspecified: Secondary | ICD-10-CM | POA: Diagnosis present

## 2023-03-25 DIAGNOSIS — I5033 Acute on chronic diastolic (congestive) heart failure: Secondary | ICD-10-CM

## 2023-03-25 DIAGNOSIS — Z7901 Long term (current) use of anticoagulants: Secondary | ICD-10-CM

## 2023-03-25 DIAGNOSIS — R339 Retention of urine, unspecified: Secondary | ICD-10-CM | POA: Diagnosis not present

## 2023-03-25 DIAGNOSIS — N1831 Chronic kidney disease, stage 3a: Secondary | ICD-10-CM | POA: Diagnosis not present

## 2023-03-25 DIAGNOSIS — D509 Iron deficiency anemia, unspecified: Secondary | ICD-10-CM | POA: Diagnosis present

## 2023-03-25 DIAGNOSIS — I255 Ischemic cardiomyopathy: Secondary | ICD-10-CM | POA: Diagnosis present

## 2023-03-25 DIAGNOSIS — J90 Pleural effusion, not elsewhere classified: Secondary | ICD-10-CM | POA: Insufficient documentation

## 2023-03-25 DIAGNOSIS — R069 Unspecified abnormalities of breathing: Secondary | ICD-10-CM | POA: Diagnosis not present

## 2023-03-25 DIAGNOSIS — R279 Unspecified lack of coordination: Secondary | ICD-10-CM | POA: Diagnosis not present

## 2023-03-25 DIAGNOSIS — I5021 Acute systolic (congestive) heart failure: Secondary | ICD-10-CM | POA: Diagnosis not present

## 2023-03-25 DIAGNOSIS — N189 Chronic kidney disease, unspecified: Secondary | ICD-10-CM | POA: Diagnosis not present

## 2023-03-25 DIAGNOSIS — A419 Sepsis, unspecified organism: Secondary | ICD-10-CM | POA: Diagnosis not present

## 2023-03-25 DIAGNOSIS — Z888 Allergy status to other drugs, medicaments and biological substances status: Secondary | ICD-10-CM

## 2023-03-25 DIAGNOSIS — Z885 Allergy status to narcotic agent status: Secondary | ICD-10-CM

## 2023-03-25 DIAGNOSIS — K219 Gastro-esophageal reflux disease without esophagitis: Secondary | ICD-10-CM | POA: Diagnosis present

## 2023-03-25 DIAGNOSIS — R54 Age-related physical debility: Secondary | ICD-10-CM | POA: Diagnosis present

## 2023-03-25 DIAGNOSIS — I5023 Acute on chronic systolic (congestive) heart failure: Secondary | ICD-10-CM

## 2023-03-25 DIAGNOSIS — R079 Chest pain, unspecified: Secondary | ICD-10-CM | POA: Diagnosis present

## 2023-03-25 DIAGNOSIS — T380X5A Adverse effect of glucocorticoids and synthetic analogues, initial encounter: Secondary | ICD-10-CM | POA: Diagnosis not present

## 2023-03-25 DIAGNOSIS — Z882 Allergy status to sulfonamides status: Secondary | ICD-10-CM

## 2023-03-25 DIAGNOSIS — Z9861 Coronary angioplasty status: Secondary | ICD-10-CM

## 2023-03-25 DIAGNOSIS — Z743 Need for continuous supervision: Secondary | ICD-10-CM | POA: Diagnosis not present

## 2023-03-25 DIAGNOSIS — I129 Hypertensive chronic kidney disease with stage 1 through stage 4 chronic kidney disease, or unspecified chronic kidney disease: Secondary | ICD-10-CM | POA: Diagnosis not present

## 2023-03-25 DIAGNOSIS — I5041 Acute combined systolic (congestive) and diastolic (congestive) heart failure: Secondary | ICD-10-CM | POA: Diagnosis not present

## 2023-03-25 DIAGNOSIS — E785 Hyperlipidemia, unspecified: Secondary | ICD-10-CM | POA: Diagnosis not present

## 2023-03-25 DIAGNOSIS — Z515 Encounter for palliative care: Secondary | ICD-10-CM | POA: Diagnosis not present

## 2023-03-25 DIAGNOSIS — Z794 Long term (current) use of insulin: Secondary | ICD-10-CM | POA: Diagnosis not present

## 2023-03-25 DIAGNOSIS — I1310 Hypertensive heart and chronic kidney disease without heart failure, with stage 1 through stage 4 chronic kidney disease, or unspecified chronic kidney disease: Secondary | ICD-10-CM | POA: Diagnosis not present

## 2023-03-25 DIAGNOSIS — Z992 Dependence on renal dialysis: Secondary | ICD-10-CM | POA: Diagnosis not present

## 2023-03-25 DIAGNOSIS — E1121 Type 2 diabetes mellitus with diabetic nephropathy: Secondary | ICD-10-CM | POA: Diagnosis not present

## 2023-03-25 DIAGNOSIS — Z9862 Peripheral vascular angioplasty status: Secondary | ICD-10-CM

## 2023-03-25 DIAGNOSIS — F419 Anxiety disorder, unspecified: Secondary | ICD-10-CM | POA: Diagnosis present

## 2023-03-25 DIAGNOSIS — R0602 Shortness of breath: Secondary | ICD-10-CM | POA: Diagnosis not present

## 2023-03-25 DIAGNOSIS — I272 Pulmonary hypertension, unspecified: Secondary | ICD-10-CM | POA: Diagnosis present

## 2023-03-25 DIAGNOSIS — N281 Cyst of kidney, acquired: Secondary | ICD-10-CM | POA: Diagnosis not present

## 2023-03-25 DIAGNOSIS — I080 Rheumatic disorders of both mitral and aortic valves: Secondary | ICD-10-CM | POA: Diagnosis present

## 2023-03-25 DIAGNOSIS — I2510 Atherosclerotic heart disease of native coronary artery without angina pectoris: Secondary | ICD-10-CM | POA: Diagnosis present

## 2023-03-25 DIAGNOSIS — R601 Generalized edema: Secondary | ICD-10-CM | POA: Diagnosis not present

## 2023-03-25 DIAGNOSIS — E1129 Type 2 diabetes mellitus with other diabetic kidney complication: Secondary | ICD-10-CM | POA: Diagnosis present

## 2023-03-25 DIAGNOSIS — E8721 Acute metabolic acidosis: Secondary | ICD-10-CM | POA: Diagnosis not present

## 2023-03-25 DIAGNOSIS — I959 Hypotension, unspecified: Secondary | ICD-10-CM | POA: Diagnosis not present

## 2023-03-25 LAB — TROPONIN I (HIGH SENSITIVITY)
Troponin I (High Sensitivity): 25 ng/L — ABNORMAL HIGH (ref <18)
Troponin I (High Sensitivity): 44 ng/L — ABNORMAL HIGH (ref <18)
Troponin I (High Sensitivity): 225 ng/L (ref <18)
Troponin I (High Sensitivity): 458 ng/L (ref <18)

## 2023-03-25 LAB — COMPREHENSIVE METABOLIC PANEL
ALT: 16 U/L (ref 0–44)
AST: 22 U/L (ref 15–41)
Albumin: 3.5 g/dL (ref 3.5–5.0)
Alkaline Phosphatase: 73 U/L (ref 38–126)
Anion gap: 11 (ref 5–15)
BUN: 34 mg/dL — ABNORMAL HIGH (ref 8–23)
CO2: 20 mmol/L — ABNORMAL LOW (ref 22–32)
Calcium: 8.7 mg/dL — ABNORMAL LOW (ref 8.9–10.3)
Chloride: 96 mmol/L — ABNORMAL LOW (ref 98–111)
Creatinine, Ser: 1.43 mg/dL — ABNORMAL HIGH (ref 0.44–1.00)
GFR, Estimated: 36 mL/min — ABNORMAL LOW (ref >60)
Glucose, Bld: 206 mg/dL — ABNORMAL HIGH (ref 70–99)
Potassium: 3.8 mmol/L (ref 3.5–5.1)
Sodium: 127 mmol/L — ABNORMAL LOW (ref 135–145)
Total Bilirubin: 0.6 mg/dL (ref 0.3–1.2)
Total Protein: 6.2 g/dL — ABNORMAL LOW (ref 6.5–8.1)

## 2023-03-25 LAB — BASIC METABOLIC PANEL
Anion gap: 17 — ABNORMAL HIGH (ref 5–15)
Anion gap: 17 — ABNORMAL HIGH (ref 5–15)
BUN: 38 mg/dL — ABNORMAL HIGH (ref 8–23)
BUN: 44 mg/dL — ABNORMAL HIGH (ref 8–23)
CO2: 12 mmol/L — ABNORMAL LOW (ref 22–32)
CO2: 13 mmol/L — ABNORMAL LOW (ref 22–32)
Calcium: 8.2 mg/dL — ABNORMAL LOW (ref 8.9–10.3)
Calcium: 8.5 mg/dL — ABNORMAL LOW (ref 8.9–10.3)
Chloride: 96 mmol/L — ABNORMAL LOW (ref 98–111)
Chloride: 95 mmol/L — ABNORMAL LOW (ref 98–111)
Creatinine, Ser: 1.7 mg/dL — ABNORMAL HIGH (ref 0.44–1.00)
Creatinine, Ser: 2 mg/dL — ABNORMAL HIGH (ref 0.44–1.00)
GFR, Estimated: 29 mL/min — ABNORMAL LOW (ref >60)
GFR, Estimated: 24 mL/min — ABNORMAL LOW (ref >60)
Glucose, Bld: 364 mg/dL — ABNORMAL HIGH (ref 70–99)
Glucose, Bld: 338 mg/dL — ABNORMAL HIGH (ref 70–99)
Potassium: 4.3 mmol/L (ref 3.5–5.1)
Potassium: 4.2 mmol/L (ref 3.5–5.1)
Sodium: 125 mmol/L — ABNORMAL LOW (ref 135–145)
Sodium: 125 mmol/L — ABNORMAL LOW (ref 135–145)

## 2023-03-25 LAB — BLOOD GAS, VENOUS
Acid-base deficit: 13.3 mmol/L — ABNORMAL HIGH (ref 0.0–2.0)
Bicarbonate: 11.9 mmol/L — ABNORMAL LOW (ref 20.0–28.0)
O2 Saturation: 87.1 %
Patient temperature: 37
pCO2, Ven: 26 mmHg — ABNORMAL LOW (ref 44–60)
pH, Ven: 7.27 (ref 7.25–7.43)
pO2, Ven: 53 mmHg — ABNORMAL HIGH (ref 32–45)

## 2023-03-25 LAB — CBC
HCT: 33.7 % — ABNORMAL LOW (ref 36.0–46.0)
Hemoglobin: 10.9 g/dL — ABNORMAL LOW (ref 12.0–15.0)
MCH: 28.6 pg (ref 26.0–34.0)
MCHC: 32.3 g/dL (ref 30.0–36.0)
MCV: 88.5 fL (ref 80.0–100.0)
Platelets: 268 10*3/uL (ref 150–400)
RBC: 3.81 MIL/uL — ABNORMAL LOW (ref 3.87–5.11)
RDW: 12.4 % (ref 11.5–15.5)
WBC: 12 10*3/uL — ABNORMAL HIGH (ref 4.0–10.5)
nRBC: 0 % (ref 0.0–0.2)

## 2023-03-25 LAB — LACTIC ACID, PLASMA
Lactic Acid, Venous: 2.9 mmol/L (ref 0.5–1.9)
Lactic Acid, Venous: 4.4 mmol/L (ref 0.5–1.9)
Lactic Acid, Venous: 5.5 mmol/L (ref 0.5–1.9)
Lactic Acid, Venous: 6.7 mmol/L (ref 0.5–1.9)

## 2023-03-25 LAB — CBG MONITORING, ED
Glucose-Capillary: 380 mg/dL — ABNORMAL HIGH (ref 70–99)
Glucose-Capillary: 244 mg/dL — ABNORMAL HIGH (ref 70–99)

## 2023-03-25 LAB — PROCALCITONIN: Procalcitonin: 0.16 ng/mL

## 2023-03-25 LAB — BRAIN NATRIURETIC PEPTIDE: B Natriuretic Peptide: 971.3 pg/mL — ABNORMAL HIGH (ref 0.0–100.0)

## 2023-03-25 LAB — SARS CORONAVIRUS 2 BY RT PCR: SARS Coronavirus 2 by RT PCR: NEGATIVE

## 2023-03-25 MED ORDER — INSULIN ASPART 100 UNIT/ML IJ SOLN
0.0000 [IU] | Freq: Three times a day (TID) | INTRAMUSCULAR | Status: DC
  Administered 2023-03-25: 9 [IU] via SUBCUTANEOUS
  Administered 2023-03-26: 5 [IU] via SUBCUTANEOUS
  Filled 2023-03-25 (×2): qty 1

## 2023-03-25 MED ORDER — APIXABAN 2.5 MG PO TABS: 2.5000 mg | ORAL_TABLET | Freq: Two times a day (BID) | ORAL | Status: DC

## 2023-03-25 MED ORDER — FERROUS SULFATE 325 (65 FE) MG PO TABS
325.0000 mg | ORAL_TABLET | Freq: Every day | ORAL | Status: DC
  Administered 2023-03-27 – 2023-03-29 (×3): 325 mg via ORAL
  Filled 2023-03-25 (×2): qty 1

## 2023-03-25 MED ORDER — ALBUTEROL SULFATE (2.5 MG/3ML) 0.083% IN NEBU
2.5000 mg | INHALATION_SOLUTION | RESPIRATORY_TRACT | Status: DC | PRN
  Administered 2023-03-25 (×2): 2.5 mg via RESPIRATORY_TRACT
  Filled 2023-03-25 (×2): qty 3

## 2023-03-25 MED ORDER — PROSIGHT PO TABS
1.0000 | ORAL_TABLET | Freq: Two times a day (BID) | ORAL | Status: DC
  Administered 2023-03-27 – 2023-04-20 (×47): 1 via ORAL
  Filled 2023-03-25 (×52): qty 1

## 2023-03-25 MED ORDER — DIPHENHYDRAMINE HCL 50 MG/ML IJ SOLN: 12.5000 mg | Freq: Three times a day (TID) | INTRAMUSCULAR | Status: DC | PRN

## 2023-03-25 MED ORDER — VITAMIN B-12 1000 MCG PO TABS
1000.0000 ug | ORAL_TABLET | Freq: Every day | ORAL | Status: DC
  Administered 2023-03-27 – 2023-04-20 (×25): 1000 ug via ORAL
  Filled 2023-03-25 (×26): qty 1

## 2023-03-25 MED ORDER — ROSUVASTATIN CALCIUM 10 MG PO TABS
10.0000 mg | ORAL_TABLET | ORAL | Status: DC
  Administered 2023-03-27 – 2023-04-19 (×14): 10 mg via ORAL
  Filled 2023-03-25 (×15): qty 1

## 2023-03-25 MED ORDER — METHYLPREDNISOLONE SODIUM SUCC 40 MG IJ SOLR
40.0000 mg | Freq: Two times a day (BID) | INTRAMUSCULAR | Status: DC
  Administered 2023-03-25 – 2023-03-26 (×2): 40 mg via INTRAVENOUS
  Filled 2023-03-25 (×2): qty 1

## 2023-03-25 MED ORDER — METOPROLOL TARTRATE 5 MG/5ML IV SOLN: 5.0000 mg | INTRAVENOUS | Status: DC | PRN

## 2023-03-25 MED ORDER — PROMETHAZINE HCL 6.25 MG/5ML PO SOLN
6.2500 mg | Freq: Every day | ORAL | Status: DC
  Administered 2023-03-25 – 2023-04-19 (×26): 6.25 mg via ORAL
  Filled 2023-03-25 (×27): qty 5

## 2023-03-25 MED ORDER — IPRATROPIUM-ALBUTEROL 0.5-2.5 (3) MG/3ML IN SOLN
3.0000 mL | RESPIRATORY_TRACT | Status: DC
  Administered 2023-03-25 – 2023-03-26 (×5): 3 mL via RESPIRATORY_TRACT
  Filled 2023-03-25 (×5): qty 3

## 2023-03-25 MED ORDER — DM-GUAIFENESIN ER 30-600 MG PO TB12
1.0000 | ORAL_TABLET | Freq: Two times a day (BID) | ORAL | Status: DC | PRN
  Administered 2023-04-01 – 2023-04-17 (×9): 1 via ORAL
  Filled 2023-03-25 (×10): qty 1

## 2023-03-25 MED ORDER — LORAZEPAM 2 MG/ML IJ SOLN
0.5000 mg | Freq: Once | INTRAMUSCULAR | Status: AC
  Administered 2023-03-25: 0.5 mg via INTRAVENOUS
  Filled 2023-03-25: qty 1

## 2023-03-25 MED ORDER — AMLODIPINE BESYLATE 5 MG PO TABS
5.0000 mg | ORAL_TABLET | Freq: Every day | ORAL | Status: DC
  Administered 2023-03-25: 5 mg via ORAL
  Filled 2023-03-25: qty 1

## 2023-03-25 MED ORDER — DEXTROMETHORPHAN POLISTIREX ER 30 MG/5ML PO SUER
15.0000 mg | Freq: Every day | ORAL | Status: DC
  Administered 2023-03-25 – 2023-03-27 (×3): 15 mg via ORAL
  Filled 2023-03-25 (×5): qty 5

## 2023-03-25 MED ORDER — INSULIN ASPART 100 UNIT/ML IJ SOLN
0.0000 [IU] | Freq: Every day | INTRAMUSCULAR | Status: DC
  Administered 2023-03-25: 2 [IU] via SUBCUTANEOUS
  Filled 2023-03-25: qty 1

## 2023-03-25 MED ORDER — PROMETHAZINE-DM 6.25-15 MG/5ML PO SYRP: 5.0000 mL | ORAL_SOLUTION | Freq: Every day | ORAL | Status: DC

## 2023-03-25 MED ORDER — GABAPENTIN 100 MG PO CAPS
100.0000 mg | ORAL_CAPSULE | Freq: Every day | ORAL | Status: DC
  Administered 2023-03-25 – 2023-04-19 (×26): 100 mg via ORAL
  Filled 2023-03-25 (×26): qty 1

## 2023-03-25 MED ORDER — SODIUM CHLORIDE 0.9 % IV SOLN
500.0000 mg | INTRAVENOUS | Status: AC
  Administered 2023-03-25 – 2023-03-29 (×5): 500 mg via INTRAVENOUS
  Filled 2023-03-25 (×5): qty 5

## 2023-03-25 MED ORDER — SODIUM CHLORIDE 0.9 % IV SOLN
2.0000 g | INTRAVENOUS | Status: DC
  Administered 2023-03-25 – 2023-03-27 (×3): 2 g via INTRAVENOUS
  Filled 2023-03-25 (×3): qty 20

## 2023-03-25 MED ORDER — FUROSEMIDE 10 MG/ML IJ SOLN
40.0000 mg | Freq: Once | INTRAMUSCULAR | Status: AC
  Administered 2023-03-25: 40 mg via INTRAVENOUS
  Filled 2023-03-25: qty 4

## 2023-03-25 MED ORDER — GLUCERNA SHAKE PO LIQD
237.0000 mL | Freq: Three times a day (TID) | ORAL | Status: DC
  Administered 2023-03-28 – 2023-04-05 (×15): 237 mL via ORAL

## 2023-03-25 MED ORDER — INSULIN GLARGINE-YFGN 100 UNIT/ML ~~LOC~~ SOLN
4.0000 [IU] | Freq: Every day | SUBCUTANEOUS | Status: DC
  Administered 2023-03-25 – 2023-03-27 (×3): 4 [IU] via SUBCUTANEOUS
  Filled 2023-03-25 (×4): qty 0.04

## 2023-03-25 MED ORDER — APIXABAN 2.5 MG PO TABS
2.5000 mg | ORAL_TABLET | Freq: Two times a day (BID) | ORAL | Status: DC
  Administered 2023-03-25: 2.5 mg via ORAL
  Filled 2023-03-25 (×2): qty 1

## 2023-03-25 MED ORDER — HYDRALAZINE HCL 20 MG/ML IJ SOLN
5.0000 mg | INTRAMUSCULAR | Status: DC | PRN
  Administered 2023-03-25: 5 mg via INTRAVENOUS
  Filled 2023-03-25: qty 1

## 2023-03-25 MED ORDER — ACETAMINOPHEN 325 MG PO TABS
650.0000 mg | ORAL_TABLET | Freq: Four times a day (QID) | ORAL | Status: DC | PRN
  Administered 2023-03-30 – 2023-04-13 (×4): 650 mg via ORAL
  Filled 2023-03-25 (×4): qty 2

## 2023-03-25 MED ORDER — MELATONIN 5 MG PO TABS
2.5000 mg | ORAL_TABLET | Freq: Every evening | ORAL | Status: DC | PRN
  Administered 2023-03-29 – 2023-04-11 (×7): 2.5 mg via ORAL
  Filled 2023-03-25 (×7): qty 1

## 2023-03-25 MED ORDER — SODIUM CHLORIDE 1 G PO TABS
1.0000 g | ORAL_TABLET | Freq: Two times a day (BID) | ORAL | Status: DC
  Administered 2023-03-25 – 2023-03-31 (×12): 1 g via ORAL
  Filled 2023-03-25 (×12): qty 1

## 2023-03-25 NOTE — ED Notes (Signed)
Pt given applesauce per request  ?

## 2023-03-25 NOTE — Progress Notes (Addendum)
       CROSS COVER NOTE  NAME: Jill Hudson MRN: [id number] DOB : 17-May-1935    Concern from nurse Eyvonne Mechanic   hey, troponin came back 225, just letting you know   No chest pain   Pertinent findings on chart review: H&P from earlier today reviewed Admitted for COPD exacerbation and severe sepsis and CHF exacerbation.  Patient is on Eliquis for PE Cardiac Panel (last 3 results) Recent Labs    03/25/23 1001 03/25/23 1209 03/25/23 1857  TROPONINIHS 25* 44* 225*      Assessment and  Interventions   Assessment: Suspecting demand ischemia related to multiple acute medical  Plan: Get EKG----sinus tach Had echo yesterday ordered by admitting provider so look out segmental wall motion abnormality Continue current management of sepsis, COPD and CHF exacerbations Hold off on heparin Troponin continues to uptrend so will get cardiology consult for opinion  Cardiac Panel (last 3 results) Recent Labs    03/25/23 1857 03/25/23 2156 03/26/23 0444  TROPONINIHS 225* 458* 1,186*    Dx NSTEMI Possibly demand but will start heparin Addendum: Will start heparin

## 2023-03-25 NOTE — Patient Outreach (Signed)
  Care Coordination   Follow Up Visit Note   03/25/2023 Name: Jill Hudson MRN: [id number] DOB: 1935-06-08  Jill Hudson is a 88 y.o. year old female who sees Reubin Milan, MD for primary care.   Patient scheduled for outreach today, noted she is currently in the ED pending admission.  Hospital liaisons notified.   SDOH assessments and interventions completed:  No     Care Coordination Interventions:  Yes, provided    Interventions Today    Flowsheet Row Most Recent Value  Chronic Disease   Chronic disease during today's visit Other  [possible sepsis]  General Interventions   General Interventions Discussed/Reviewed Communication with  Communication with RN  [facility] liaisons notified of admission]        Follow up plan:  pending hospital discharge    Encounter Outcome:  Pt. Visit Completed   Kemper Durie, RN, MSN, [facility] Care Management Care Management Coordinator [phone]

## 2023-03-25 NOTE — ED Triage Notes (Addendum)
Pt arrives from home via [facility] with c/o respiratory distress. Pt was 86% on RA and taking a breathing Tx when EMS arrived and per EMS pt was wheezy and tachycardic. Pt has a hx of COPD, AFIB. Pt received 2 duonebs, 1 albuterol tx, and 125 of solumedrol en route. Per EMS, pt family reported pt was lethargic yesterday and not her normal self.

## 2023-03-25 NOTE — Sepsis Progress Note (Signed)
Elink following code sepsis °

## 2023-03-25 NOTE — ED Notes (Signed)
Daughter bedside at this time- updated on care plan

## 2023-03-25 NOTE — ED Notes (Signed)
Pt AOX4, cannot tolerate laying flat for long. Audible wheeze noted throughout.

## 2023-03-25 NOTE — Chronic Care Management (AMB) (Signed)
   03/25/2023  TANNIS DYMENT 05/20/1935 [id number]   Reason for Encounter: Patient is not currently enrolled in the CCM program. CCM status changed to previously enrolled  Alto Denver RN, MSN, CCM RN Care Manager  Chronic Care Management Direct Number: [phone]

## 2023-03-25 NOTE — H&P (Signed)
History and Physical    Jill Hudson:[id number] DOB: Sep 28, 1935 DOA: 03/25/2023  Referring MD/NP/PA:   PCP: Reubin Milan, MD   Patient coming from:  The patient is coming from home.     Chief Complaint:   HPI: Jill Hudson is a 88 y.o. female with medical history significant of COPD, hypertension, hyperlipidemia, diabetes mellitus, stroke, TIA, GERD, anxiety, PVD, CAD, myocardial infarction, former smoker, CKD stage III, GI bleeding, iron deficiency anemia, PAD (s/p of stent in left  leg), who presents with shortness of breath.   Patient states that she has SOB for 2 days.  She reports dry cough, no chest pain, fever or chills.  Patient is not using oxygen normally, was found to have oxygen desaturation to 86% on room air, which improved to 94% on 2 L oxygen, still feels shortness of breath, increased to 4L of oxygen and then started on BiPAP. Denies nausea vomiting, diarrhea or abdominal pain.  No symptoms of UTI.  Patient is taking Eliquis consistently, last dose was last night.  Patient has chronic left lower leg edema.  Patient had negative left lower extremity venous Doppler for DVT on 02/12/2023.  Data reviewed independently and ED Course: pt was found to have WBC 12.0, BNP 971.3, negative COVID PCR, renal function close to baseline, sodium 127, temperature normal, blood pressure 112/56, heart rate 115, RR 30.  Patient is admitted to PCU as inpatient.  Chest x-ray showed: 1. Asymmetric interstitial and airspace opacities at the right base with bilateral pleural effusions. Findings are concerning for infection. 2. Mild pulmonary vascular congestion without frank edema.   EKG: I have personally reviewed.  Sinus rhythm, QTc 513, and stable baseline, seem to be in sinus rhythm   Review of Systems:   General: no fevers, chills, no body weight gain, has fatigue HEENT: no blurry vision, hearing changes or sore throat Respiratory: has dyspnea, coughing, wheezing CV: no chest  pain, no palpitations GI: no nausea, vomiting, abdominal pain, diarrhea, constipation GU: no dysuria, burning on urination, increased urinary frequency, hematuria  Ext: has leg edema Neuro: no unilateral weakness, numbness, or tingling, no vision change or hearing loss Skin: no rash, no skin tear. MSK: No muscle spasm, no deformity, no limitation of range of movement in spin Heme: No easy bruising.  Travel history: No recent long distant travel.   Allergy:  Allergies  Allergen Reactions   Cefdinir Diarrhea   Saxagliptin Diarrhea   Epinephrine Other (See Comments)    Patient does not remember what happens when she uses this   Evolocumab     Pain with injection   Atorvastatin Other (See Comments)    Muscle aches   Codeine Other (See Comments)    Upset stomach   Ezetimibe Other (See Comments)    Myalgias(ZETIA)   Limonene Rash    Patient does not recall this reaction   Nitrofurantoin Rash and Other (See Comments)    Pruitus   Sulfa Antibiotics Rash and Other (See Comments)    Sore mouth     Past Medical History:  Diagnosis Date   Acute CHF (congestive heart failure) (HCC) 02/18/2020   Acute diastolic CHF (congestive heart failure) (HCC)    Acute kidney injury superimposed on CKD (HCC)    Allergies    Anxiety    Arthritis    spine and shoulder   Atherosclerosis of artery of extremity with rest pain (HCC) 10/12/2018   Cancer (HCC)    skin   CHF (congestive  heart failure) (HCC)    Diabetes mellitus without complication (HCC)    Diverticulosis of large intestine with hemorrhage    GI bleeding 12/25/2019   Heart murmur    Hyperlipidemia    Hypertension    Macula lutea degeneration    Mitral and aortic valve disease    Myocardial infarction (HCC)    may have had a "light" heart attack   Non-ST elevation (NSTEMI) myocardial infarction (HCC)    Occasional tremors    PAD (peripheral artery disease) (HCC)    Rectal bleeding    Shingles    patient unaware but daughter  confirms. it was a long time ago   Stroke [facility]) 01/2017   may have had a slight stroke   TIA (transient ischemic attack) 01/2017   UTI (urinary tract infection)    Vascular disease, peripheral (HCC)     Past Surgical History:  Procedure Laterality Date   CARDIAC CATHETERIZATION  1998   40% LM, 95% Ramus interm   CATARACT EXTRACTION, BILATERAL     COLONOSCOPY WITH PROPOFOL N/A 08/12/2018   Procedure: COLONOSCOPY WITH PROPOFOL;  Surgeon: Midge Minium, MD;  Location: [facility] ENDOSCOPY;  Service: Endoscopy;  Laterality: N/A;   ENDARTERECTOMY FEMORAL Left 10/12/2018   Procedure: ENDARTERECTOMY FEMORAL;  Surgeon: Renford Dills, MD;  Location: [facility] ORS;  Service: Vascular;  Laterality: Left;  angioplasty and left SFA stent placement   EYE SURGERY Bilateral    cataract extractions   LOWER EXTREMITY ANGIOGRAPHY Left 08/23/2017   Procedure: Lower Extremity Angiography;  Surgeon: Annice Needy, MD;  Location: [facility] INVASIVE CV LAB;  Service: Cardiovascular;  Laterality: Left;   LOWER EXTREMITY ANGIOGRAPHY Left 07/26/2018   Procedure: LOWER EXTREMITY ANGIOGRAPHY;  Surgeon: Renford Dills, MD;  Location: [facility] INVASIVE CV LAB;  Service: Cardiovascular;  Laterality: Left;   LOWER EXTREMITY ANGIOGRAPHY Left 09/16/2018   Procedure: LOWER EXTREMITY ANGIOGRAPHY;  Surgeon: Renford Dills, MD;  Location: [facility] INVASIVE CV LAB;  Service: Cardiovascular;  Laterality: Left;   PTCA  08/2013   Left common iliac   PTCA  12/2012   left ext iliac   RIGHT HEART CATH N/A 02/23/2020   Procedure: RIGHT HEART CATH;  Surgeon: Antonieta Iba, MD;  Location: [facility] INVASIVE CV LAB;  Service: Cardiovascular;  Laterality: N/A;   TUBAL LIGATION      Social History:  reports that she quit smoking about 44 years ago. Her smoking use included cigarettes. She has a 40.00 pack-year smoking history. She has never used smokeless tobacco. She reports that she does not drink alcohol and does not use drugs.  Family History:   Family History  Problem Relation Age of Onset   Dementia Mother    Diabetes Father      Prior to Admission medications   Medication Sig Start Date End Date Taking? Authorizing Provider  acetaminophen (TYLENOL) 650 MG CR tablet Take 1,300 mg by mouth every 8 (eight) hours as needed for pain.    [provider]  albuterol (VENTOLIN HFA) 108 (90 Base) MCG/ACT inhaler Inhale 2 puffs into the lungs every 6 (six) hours as needed for wheezing or shortness of breath. 01/01/23   Reubin Milan, MD  amLODipine (NORVASC) 5 MG tablet Take 5 mg by mouth in the morning and at bedtime. 09/03/20   [provider]  apixaban (ELIQUIS) 2.5 MG TABS tablet TAKE 1 TABLET(2.5 MG) BY MOUTH TWICE DAILY 03/24/22   Reubin Milan, MD  cloNIDine (CATAPRES) 0.1 MG tablet Take 0.1  mg by mouth 2 (two) times daily as needed. 08/19/21   [provider]  Cyanocobalamin 1000 MCG TBCR Take 1,000 mcg by mouth daily.    [provider]  feeding supplement, GLUCERNA SHAKE, (GLUCERNA SHAKE) LIQD Take 237 mLs by mouth 3 (three) times daily between meals. 12/28/19   Alford Highland, MD  ferrous sulfate 325 (65 FE) MG tablet Take 325 mg by mouth daily with breakfast.    [provider]  Fluticasone-Umeclidin-Vilant (TRELEGY ELLIPTA) 100-62.5-25 MCG/ACT AEPB Inhale 1 puff into the lungs daily. 02/19/23   Reubin Milan, MD  gabapentin (NEURONTIN) 100 MG capsule TAKE 1 CAPSULE(100 MG) BY MOUTH AT BEDTIME 09/24/22   Reubin Milan, MD  insulin glargine (LANTUS) 100 UNIT/ML injection Inject 0.05 mLs (5 Units total) into the skin daily. 03/24/22   Reubin Milan, MD  Insulin Lispro w/ Trans Port 100 UNIT/ML SOPN 3 Units daily. ( Humalog ) 04/08/21   [provider]  Insulin Pen Needle (B-D ULTRAFINE III SHORT PEN) 31G X 8 MM MISC USE AS DIRECTED 03/24/22   Reubin Milan, MD  ipratropium-albuterol (DUONEB) 0.5-2.5 (3) MG/3ML SOLN Take 3 mLs by nebulization every 6 (six) hours as  needed. 02/19/23   Reubin Milan, MD  losartan (COZAAR) 25 MG tablet Take 25 mg by mouth 2 (two) times daily. Unsure of dosage    [provider]  melatonin 5 MG TABS Take 0.5 tablets (2.5 mg total) by mouth at bedtime as needed (sleep). 02/26/20   Alford Highland, MD  Multiple Vitamins-Minerals (PRESERVISION AREDS PO) Take 1 capsule by mouth 2 (two) times daily.     [provider]  promethazine-dextromethorphan (PROMETHAZINE-DM) 6.25-15 MG/5ML syrup TAKE 5 ML BY MOUTH EVERY NIGHT AT BEDTIME 03/19/23   Reubin Milan, MD  rosuvastatin (CRESTOR) 10 MG tablet Take 1 tablet (10 mg total) by mouth 4 (four) times a week. Monday, Wed, Friday and Saturday 01/02/23   Reubin Milan, MD  torsemide (DEMADEX) 20 MG tablet Take 2 tablets (40 mg total) by mouth daily. Patient taking differently: Take 40 mg by mouth 2 (two) times daily. 1/2 tablet morning and 1/2 tablet at night. 02/27/20   Alford Highland, MD    Physical Exam: Vitals:   03/25/23 1640 03/25/23 1649 03/25/23 1700 03/25/23 1715  BP: 120/73  (!) 139/117   Pulse: (!) 118 (!) 127 (!) 124 (!) 120  Resp: (!) 24 (!) 26 (!) 34 (!) 30  Temp:      TempSrc:      SpO2: 95% 94% 91% 98%  Weight:      Height:       General: Not in acute distress HEENT:       Eyes: PERRL, EOMI, no scleral icterus.       ENT: No discharge from the ears and nose, no pharynx injection, no tonsillar enlargement.        Neck: No JVD, no bruit, no mass felt. Heme: No neck lymph node enlargement. Cardiac: S1/S2, RRR, No murmurs, No gallops or rubs. Respiratory: Has diffuse rhonchi, crackles and mild wheezing bilaterally GI: Soft, nondistended, nontender, no rebound pain, no organomegaly, BS present. GU: No hematuria Ext: 1+DP/PT pulse bilaterally.  Patient has asymmetric leg edema, with trace leg edema in the right leg and 1+ edema in the left leg. Musculoskeletal: No joint deformities, No joint redness or warmth, no limitation of ROM in  spin. Skin: No rashes.  Neuro: Alert, oriented X3, cranial nerves II-XII grossly intact,  moves all extremities normally.  Psych: Patient is not psychotic, no suicidal or hemocidal ideation.  Labs on Admission: I have personally reviewed following labs and imaging studies  CBC: Recent Labs  Lab 03/25/23 1001  WBC 12.0*  HGB 10.9*  HCT 33.7*  MCV 88.5  PLT 268   Basic Metabolic Panel: Recent Labs  Lab 03/25/23 1001 03/25/23 1318  NA 127* 125*  K 3.8 4.3  CL 96* 96*  CO2 20* 12*  GLUCOSE 206* 364*  BUN 34* 38*  CREATININE 1.43* 1.70*  CALCIUM 8.7* 8.2*   GFR: Estimated Creatinine Clearance: 18.7 mL/min (A) (by C-G formula based on SCr of 1.7 mg/dL (H)). Liver Function Tests: Recent Labs  Lab 03/25/23 1001  AST 22  ALT 16  ALKPHOS 73  BILITOT 0.6  PROT 6.2*  ALBUMIN 3.5   No results for input(s): "LIPASE", "AMYLASE" in the last 168 hours. No results for input(s): "AMMONIA" in the last 168 hours. Coagulation Profile: No results for input(s): "INR", "PROTIME" in the last 168 hours. Cardiac Enzymes: No results for input(s): "CKTOTAL", "CKMB", "CKMBINDEX", "TROPONINI" in the last 168 hours. BNP (last 3 results) No results for input(s): "PROBNP" in the last 8760 hours. HbA1C: No results for input(s): "HGBA1C" in the last 72 hours. CBG: Recent Labs  Lab 03/25/23 1559  GLUCAP 380*   Lipid Profile: No results for input(s): "CHOL", "HDL", "LDLCALC", "TRIG", "CHOLHDL", "LDLDIRECT" in the last 72 hours. Thyroid Function Tests: No results for input(s): "TSH", "T4TOTAL", "FREET4", "T3FREE", "THYROIDAB" in the last 72 hours. Anemia Panel: No results for input(s): "VITAMINB12", "FOLATE", "FERRITIN", "TIBC", "IRON", "RETICCTPCT" in the last 72 hours. Urine analysis:    Component Value Date/Time   COLORURINE STRAW (A) 07/20/2020 1716   APPEARANCEUR CLEAR (A) 07/20/2020 1716   LABSPEC 1.005 07/20/2020 1716   PHURINE 8.0 07/20/2020 1716   GLUCOSEU NEGATIVE 07/20/2020  1716   HGBUR NEGATIVE 07/20/2020 1716   BILIRUBINUR Negative 03/20/2021 1103   KETONESUR NEGATIVE 07/20/2020 1716   PROTEINUR Negative 03/20/2021 1103   PROTEINUR 30 (A) 07/20/2020 1716   UROBILINOGEN 0.2 03/20/2021 1103   NITRITE Negative 03/20/2021 1103   NITRITE NEGATIVE 07/20/2020 1716   LEUKOCYTESUR Moderate (2+) (A) 03/20/2021 1103   LEUKOCYTESUR TRACE (A) 07/20/2020 1716   Sepsis Labs: @LABRCNTIP (procalcitonin:4,lacticidven:4) ) Recent Results (from the past 240 hour(s))  SARS Coronavirus 2 by RT PCR (hospital order, performed in [facility] hospital lab) *cepheid single result test* Anterior Nasal Swab     Status: None   Collection Time: 03/25/23  1:18 PM   Specimen: Anterior Nasal Swab  Result Value Ref Range Status   SARS Coronavirus 2 by RT PCR NEGATIVE NEGATIVE Final    Comment: (NOTE) SARS-CoV-2 target nucleic acids are NOT DETECTED.  The SARS-CoV-2 RNA is generally detectable in upper and lower respiratory specimens during the acute phase of infection. The lowest concentration of SARS-CoV-2 viral copies this assay can detect is 250 copies / mL. A negative result does not preclude SARS-CoV-2 infection and should not be used as the sole basis for treatment or other patient management decisions.  A negative result may occur with improper specimen collection / handling, submission of specimen other than nasopharyngeal swab, presence of viral mutation(s) within the areas targeted by this assay, and inadequate number of viral copies (<250 copies / mL). A negative result must be combined with clinical observations, patient history, and epidemiological information.  Fact Sheet for Patients:   RoadLapTop.co.za  Fact Sheet for Healthcare Providers: http://kim-miller.com/  This  test is not yet approved or  cleared by the [place] and has been authorized for detection and/or diagnosis of SARS-CoV-2 by FDA under an  Emergency Use Authorization (EUA).  This EUA will remain in effect (meaning this test can be used) for the duration of the COVID-19 declaration under Section 564(b)(1) of the Act, 21 U.S.C. section 360bbb-3(b)(1), unless the authorization is terminated or revoked sooner.  Performed at [facility], [address] [place], [place] [address]      Radiological Exams on Admission: DG Chest Portable 1 View  Result Date: 03/25/2023 CLINICAL DATA:  Respiratory distress. EXAM: PORTABLE CHEST 1 VIEW COMPARISON:  Two-view chest x-ray 05/01/2021 FINDINGS: The heart size is normal. Atherosclerotic calcifications are present at the aortic arch. Asymmetric interstitial and airspace opacities are present at the right base. Right pleural effusion is present. The smaller left pleural effusion is also suspected. Mild pulmonary vascular congestion is present without frank edema in the left lung. Asymmetric degenerative changes are present in the right shoulder. IMPRESSION: 1. Asymmetric interstitial and airspace opacities at the right base with bilateral pleural effusions. Findings are concerning for infection. 2. Mild pulmonary vascular congestion without frank edema. Electronically Signed   By: Marin Roberts M.D.   On: 03/25/2023 10:16      Assessment/Plan Principal Problem:   COPD exacerbation (HCC) Active Problems:   Severe sepsis (HCC)   Acute on chronic diastolic CHF (congestive heart failure) (HCC)   Essential hypertension   History of CVA (cerebrovascular accident)   CAD (coronary artery disease)   Myocardial injury   Atrial fibrillation with RVR (HCC)   Type II diabetes mellitus with renal manifestations (HCC)   Chronic kidney disease, stage 3a (HCC)   Hyponatremia   HLD (hyperlipidemia)   Iron deficiency anemia   PAD (peripheral artery disease) (HCC)   Assessment and Plan:   COPD exacerbation and severe sepsis: pt has diffused rhonchi and mild wheezing, clinically  consistent with COPD exacerbation. CXR showed asymmetric interstitial and airspace opacities at the right base with bilateral pleural effusions, findings are concerning for infection, can not completely rule out pneumonia.  Patient meets criteria for severe sepsis with tachycardia, And with RR up to 30.  Also has mild leukocytosis with WBC 12.0.  Lactic acid 2.9--> 4.4. pt is taking Eliquis consistently, I have low suspicions for PE.   - Will admit to PCU as inpt - BiPAP  - check VBG - IV Rocephin and azithromycin - Incentive spirometry - Solu-Medrol 40 mg twice daily - Mucinex for cough  - Bronchodilators - Urine legionella and S. pneumococcal antigen - Follow up blood culture x2, sputum culture - will get Procalcitonin and trend lactic acid level per sepsis protocol - IVF: will not give IVF due to CHF exacerbation  Acute on chronic diastolic CHF (congestive heart failure) [facility]): Patient has elevated BNP 791, leg edema, positive JVD, crackles on auscultation, clinically consistent with CHF exacerbation.  2D echo on 12/31/2021 showed EF> 55%. This is a difficult situation since patient has hyponatremia 127.  I will give a test dose of Lasix 40 mg, and follow-up BMP closely. -Lasix 40 mg IV x 1 -2d echo -Daily weights -strict I/O's -Fluid restriction  Essential hypertension: -hold Cozaar due to hyponatremia -IV hydralazine as needed -Amlodipine  History of CVA (cerebrovascular accident) -Crestor -Patient is on Eliquis for A-fib  CAD (coronary artery disease) and Myocardial injury:  Trop 25 --> 44. No CP. -Trend troponin -Check A1c, FLP -Follow-up 2D  echo -Crestor  Atrial fibrillation with RVR (HCC): Heart rate 120s -As needed metoprolol 5 mg every 2 hours for heart rate> 125 -Headache this  Type II diabetes mellitus with renal manifestations [facility]): Recent A1c 7.1, poorly controlled.  Patient taking Lantus 5 unit daily -Glargine insulin 4 units daily -Sliding scale  insulin  Chronic kidney disease, stage 3a (HCC): Renal function close to baseline.  Recent baseline creatinine 1.3-1.6.  His creatinine is 1.43, BUN 34, GFR 36. -f/u with BMP  Hyponatremia: Na 127.  Mental status normal. -Sodium chloride tablet 1 g twice daily -Careful use IV diuretics -Fluid restriction -Check BMP every 8 hour  HLD (hyperlipidemia) -Crestor  Iron deficiency anemia: Hemoglobin 10.9 (11.2 on 03/24/2022) -New iron supplement  PAD (peripheral artery disease) (HCC): S/p of a stent placement to the left leg -Patient is on Crestor and Eliquis    DVT ppx: on Eliquis  Code Status: Full code  Family Communication:  Yes, patient's daughter  at bed side.   Disposition Plan:  Anticipate discharge back to previous environment  Consults called:    Admission status and Level of care: Progressive:    as inpt      Dispo: The patient is from: Home              Anticipated d/c is to: Home              Anticipated d/c date is: 2 days              Patient currently is not medically stable to d/c.    Severity of Illness:  The appropriate patient status for this patient is INPATIENT. Inpatient status is judged to be reasonable and necessary in order to provide the required intensity of service to ensure the patient's safety. The patient's presenting symptoms, physical exam findings, and initial radiographic and laboratory data in the context of their chronic comorbidities is felt to place them at high risk for further clinical deterioration. Furthermore, it is not anticipated that the patient will be medically stable for discharge from the hospital within 2 midnights of admission.   * I certify that at the point of admission it is my clinical judgment that the patient will require inpatient hospital care spanning beyond 2 midnights from the point of admission due to high intensity of service, high risk for further deterioration and high frequency of surveillance  required.*       Date of Service 03/25/2023    Lorretta Harp [facility]   If 7PM-7AM, please contact night-coverage [email or web address] 03/25/2023, 5:34 PM

## 2023-03-25 NOTE — Consult Note (Signed)
CODE SEPSIS - PHARMACY COMMUNICATION  **Broad Spectrum Antibiotics should be administered within 1 hour of Sepsis diagnosis**  Time Code Sepsis Called/Page Received: 1135  Antibiotics Ordered: ceftriaxone and azithromycin  Time of 1st antibiotic administration: 1205  Additional action taken by pharmacy: N/A  Barrie Folk ,PharmD Clinical Pharmacist  03/25/2023  11:37 AM

## 2023-03-25 NOTE — ED Notes (Signed)
Bi-pap placed by RT at this time. Consulting civil engineer.

## 2023-03-25 NOTE — ED Notes (Signed)
Requested order for bipap from MD- pt can't speak in full sentences and is c/o increase WOB.

## 2023-03-25 NOTE — ED Provider Notes (Signed)
[facility] Provider Note    Event Date/Time   First MD Initiated Contact with Patient 03/25/23 [phone]     (approximate)  History   Chief Complaint: Shortness of Breath  HPI  Jill Hudson is a 88 y.o. female with a past medical history of CHF, anxiety, COPD, diabetes, CVA, CAD, presents to the emergency department for shortness of breath.  According to the patient for the last couple days she has been feeling somewhat short of breath worsened overnight and awoke this morning feeling very short of breath per patient.  EMS states upon arrival patient satting 86% on room air with no baseline O2 requirement.  They state patient has a history of COPD they dosed to DuoNebs 1 albuterol nebulizer and 125 mg of Solu-Medrol en route to the hospital.  Here patient denies any chest pain.  Denies any increased cough fever or congestion.  Physical Exam   Triage Vital Signs: ED Triage Vitals  Enc Vitals Group     BP 03/25/23 0928 131/62     Pulse Rate 03/25/23 0928 (!) 113     Resp 03/25/23 0928 (!) 28     Temp 03/25/23 0928 97.9 F (36.6 C)     Temp Source 03/25/23 0928 Oral     SpO2 03/25/23 0928 96 %     Weight 03/25/23 0931 124 lb (56.2 kg)     Height 03/25/23 0931 5' (1.524 m)     Head Circumference --      Peak Flow --      Pain Score 03/25/23 0928 0     Pain Loc --      Pain Edu? --      Excl. in GC? --     Most recent vital signs: Vitals:   03/25/23 0928 03/25/23 0929  BP: 131/62   Pulse: (!) 113   Resp: (!) 28   Temp: 97.9 F (36.6 C)   SpO2: 96% 96%    General: Awake, no distress.  CV:  Good peripheral perfusion.  Regular rate and rhythm around 100 bpm. Resp:  Moderate tachypnea around 30 breaths/min with mild expiratory wheeze bilaterally no obvious rales or rhonchi. Abd:  No distention.  Soft, nontender.  No rebound or guarding. Other:  Mild/minimal lower extremity IMA bilaterally, nontender   ED Results / Procedures / Treatments    EKG  EKG viewed and interpreted by myself shows sinus tachycardia 113 bpm with a narrow QRS, normal axis, normal intervals besides slight QTc prolongation, nonspecific ST changes.  RADIOLOGY  I reviewed and interpreted the x-ray images patient does appear to have mild hazy opacity in the bilateral bases. Radiology has read the x-ray as asymmetric airspace opacities concerning for infection.  Mild vascular congestion without edema.   MEDICATIONS ORDERED IN ED: Medications - No data to display   IMPRESSION / MDM / ASSESSMENT AND PLAN / ED COURSE  I reviewed the triage vital signs and the nursing notes.  Patient's presentation is most consistent with acute presentation with potential threat to life or bodily function.  Patient presents emergency department for shortness of breath worsening over the past 2 days but worse this morning.  Patient is tachypneic tachycardic mild expiratory wheeze.  Has already received DuoNebs albuterol and Solu-Medrol.  Patient does have slight lower extremity edema bilaterally we will check labs including a BNP troponin and a chest x-ray.  Differential would include COPD exacerbation, CHF exacerbation, ACS, pneumothorax, pneumonia.  Will place patient on supplemental oxygen  and titrate as able.  Patient's workup shows slight leukocytosis of 12,000, chemistry shows mild hyponatremia mild renal insufficiency.  Troponin slightly elevated BNP slightly elevated as well.  X-rays most consistent with likely infectious etiology patient does state cough with increased shortness of breath x 2 days.  Will cover with Zithromax and Rocephin.  Will admit to the hospital service for further workup and treatment.  Patient agreeable to plan of care.  Given the elevated respiratory rate tachycardia and presumed pneumonia on x-ray meets sepsis criteria.  CRITICAL CARE Performed by: Minna Antis   Total critical care time: 30 minutes  Critical care time was exclusive of  separately billable procedures and treating other patients.  Critical care was necessary to treat or prevent imminent or life-threatening deterioration.  Critical care was time spent personally by me on the following activities: development of treatment plan with patient and/or surrogate as well as nursing, discussions with consultants, evaluation of patient's response to treatment, examination of patient, obtaining history from patient or surrogate, ordering and performing treatments and interventions, ordering and review of laboratory studies, ordering and review of radiographic studies, pulse oximetry and re-evaluation of patient's condition.   FINAL CLINICAL IMPRESSION(S) / ED DIAGNOSES   Dyspnea Hypoxia Sepsis Pneumonia   Note:  This document was prepared using Dragon voice recognition software and may include unintentional dictation errors.   Minna Antis, MD 03/25/23 1134

## 2023-03-26 ENCOUNTER — Inpatient Hospital Stay (HOSPITAL_COMMUNITY)
Admit: 2023-03-26 | Discharge: 2023-03-26 | Disposition: A | Discharge status: Home / Self Care | Payer: Medicare HMO | Attending: Internal Medicine | Admitting: Internal Medicine

## 2023-03-26 DIAGNOSIS — I5021 Acute systolic (congestive) heart failure: Secondary | ICD-10-CM

## 2023-03-26 DIAGNOSIS — I480 Paroxysmal atrial fibrillation: Secondary | ICD-10-CM | POA: Diagnosis not present

## 2023-03-26 DIAGNOSIS — R778 Other specified abnormalities of plasma proteins: Secondary | ICD-10-CM

## 2023-03-26 DIAGNOSIS — I5033 Acute on chronic diastolic (congestive) heart failure: Secondary | ICD-10-CM | POA: Diagnosis not present

## 2023-03-26 DIAGNOSIS — J441 Chronic obstructive pulmonary disease with (acute) exacerbation: Secondary | ICD-10-CM | POA: Diagnosis not present

## 2023-03-26 LAB — BASIC METABOLIC PANEL
Anion gap: 11 (ref 5–15)
Anion gap: 15 (ref 5–15)
BUN: 48 mg/dL — ABNORMAL HIGH (ref 8–23)
BUN: 55 mg/dL — ABNORMAL HIGH (ref 8–23)
CO2: 18 mmol/L — ABNORMAL LOW (ref 22–32)
CO2: 16 mmol/L — ABNORMAL LOW (ref 22–32)
Calcium: 8.8 mg/dL — ABNORMAL LOW (ref 8.9–10.3)
Calcium: 8.6 mg/dL — ABNORMAL LOW (ref 8.9–10.3)
Chloride: 96 mmol/L — ABNORMAL LOW (ref 98–111)
Chloride: 94 mmol/L — ABNORMAL LOW (ref 98–111)
Creatinine, Ser: 2.03 mg/dL — ABNORMAL HIGH (ref 0.44–1.00)
Creatinine, Ser: 2.36 mg/dL — ABNORMAL HIGH (ref 0.44–1.00)
GFR, Estimated: 23 mL/min — ABNORMAL LOW (ref >60)
GFR, Estimated: 20 mL/min — ABNORMAL LOW (ref >60)
Glucose, Bld: 264 mg/dL — ABNORMAL HIGH (ref 70–99)
Glucose, Bld: 231 mg/dL — ABNORMAL HIGH (ref 70–99)
Potassium: 4.5 mmol/L (ref 3.5–5.1)
Potassium: 4.7 mmol/L (ref 3.5–5.1)
Sodium: 125 mmol/L — ABNORMAL LOW (ref 135–145)
Sodium: 125 mmol/L — ABNORMAL LOW (ref 135–145)

## 2023-03-26 LAB — ECHOCARDIOGRAM COMPLETE
AR max vel: 2.44 cm2
AV Area VTI: 2.36 cm2
AV Area mean vel: 2.33 cm2
AV Mean grad: 2 mmHg
AV Peak grad: 4.1 mmHg
Ao pk vel: 1.01 m/s
Area-P 1/2: 4.24 cm2
Height: 60 in
MV M vel: 4.46 m/s
MV Peak grad: 79.6 mmHg
MV VTI: 1.38 cm2
Radius: 0.6 cm
S' Lateral: 4 cm
Weight: 1984 oz

## 2023-03-26 LAB — PROCALCITONIN: Procalcitonin: 2.84 ng/mL

## 2023-03-26 LAB — TROPONIN I (HIGH SENSITIVITY)
Troponin I (High Sensitivity): 1186 ng/L (ref <18)
Troponin I (High Sensitivity): 1165 ng/L (ref <18)
Troponin I (High Sensitivity): 1263 ng/L (ref <18)

## 2023-03-26 LAB — LIPID PANEL
Cholesterol: 117 mg/dL (ref 0–200)
HDL: 65 mg/dL (ref >40)
LDL Cholesterol: 41 mg/dL (ref 0–99)
Total CHOL/HDL Ratio: 1.8 RATIO
Triglycerides: 53 mg/dL (ref <150)
VLDL: 11 mg/dL (ref 0–40)

## 2023-03-26 LAB — PROTIME-INR
INR: 1.4 — ABNORMAL HIGH (ref 0.8–1.2)
Prothrombin Time: 17.5 seconds — ABNORMAL HIGH (ref 11.4–15.2)

## 2023-03-26 LAB — APTT
aPTT: 36 seconds (ref 24–36)
aPTT: 95 seconds — ABNORMAL HIGH (ref 24–36)
aPTT: 120 seconds — ABNORMAL HIGH (ref 24–36)

## 2023-03-26 LAB — CBC
HCT: 31.7 % — ABNORMAL LOW (ref 36.0–46.0)
Hemoglobin: 10.3 g/dL — ABNORMAL LOW (ref 12.0–15.0)
MCH: 28.5 pg (ref 26.0–34.0)
MCHC: 32.5 g/dL (ref 30.0–36.0)
MCV: 87.6 fL (ref 80.0–100.0)
Platelets: 264 10*3/uL (ref 150–400)
RBC: 3.62 MIL/uL — ABNORMAL LOW (ref 3.87–5.11)
RDW: 12.8 % (ref 11.5–15.5)
WBC: 20.7 10*3/uL — ABNORMAL HIGH (ref 4.0–10.5)
nRBC: 0 % (ref 0.0–0.2)

## 2023-03-26 LAB — HEMOGLOBIN A1C
Hgb A1c MFr Bld: 7.9 % — ABNORMAL HIGH (ref 4.8–5.6)
Mean Plasma Glucose: 180 mg/dL

## 2023-03-26 LAB — CBG MONITORING, ED
Glucose-Capillary: 255 mg/dL — ABNORMAL HIGH (ref 70–99)
Glucose-Capillary: 266 mg/dL — ABNORMAL HIGH (ref 70–99)
Glucose-Capillary: 248 mg/dL — ABNORMAL HIGH (ref 70–99)
Glucose-Capillary: 227 mg/dL — ABNORMAL HIGH (ref 70–99)
Glucose-Capillary: 212 mg/dL — ABNORMAL HIGH (ref 70–99)

## 2023-03-26 LAB — LACTIC ACID, PLASMA
Lactic Acid, Venous: 2.8 mmol/L (ref 0.5–1.9)
Lactic Acid, Venous: 2 mmol/L (ref 0.5–1.9)

## 2023-03-26 MED ORDER — HEPARIN (PORCINE) 25000 UT/250ML-% IV SOLN
950.0000 [IU]/h | INTRAVENOUS | Status: DC
  Administered 2023-03-26 – 2023-03-29 (×2): 750 [IU]/h via INTRAVENOUS
  Filled 2023-03-26 (×3): qty 250

## 2023-03-26 MED ORDER — METOPROLOL TARTRATE 5 MG/5ML IV SOLN: 5.0000 mg | INTRAVENOUS | Status: DC | PRN

## 2023-03-26 MED ORDER — INSULIN ASPART 100 UNIT/ML IJ SOLN
0.0000 [IU] | Freq: Four times a day (QID) | INTRAMUSCULAR | Status: DC
  Administered 2023-03-26 (×2): 3 [IU] via SUBCUTANEOUS
  Administered 2023-03-27 (×2): 2 [IU] via SUBCUTANEOUS
  Administered 2023-03-27 (×2): 3 [IU] via SUBCUTANEOUS
  Administered 2023-03-28: 5 [IU] via SUBCUTANEOUS
  Filled 2023-03-26 (×7): qty 1

## 2023-03-26 MED ORDER — METHYLPREDNISOLONE SODIUM SUCC 40 MG IJ SOLR
40.0000 mg | Freq: Every day | INTRAMUSCULAR | Status: DC
  Administered 2023-03-27: 40 mg via INTRAVENOUS
  Filled 2023-03-26 (×2): qty 1

## 2023-03-26 MED ORDER — VANCOMYCIN VARIABLE DOSE PER UNSTABLE RENAL FUNCTION (PHARMACIST DOSING): Status: DC

## 2023-03-26 MED ORDER — ARFORMOTEROL TARTRATE 15 MCG/2ML IN NEBU
15.0000 ug | INHALATION_SOLUTION | Freq: Two times a day (BID) | RESPIRATORY_TRACT | Status: DC
  Administered 2023-03-26 – 2023-04-20 (×48): 15 ug via RESPIRATORY_TRACT
  Filled 2023-03-26 (×53): qty 2

## 2023-03-26 MED ORDER — FUROSEMIDE 40 MG PO TABS
40.0000 mg | ORAL_TABLET | Freq: Two times a day (BID) | ORAL | Status: DC
  Administered 2023-03-26 – 2023-03-27 (×2): 40 mg via ORAL
  Filled 2023-03-26 (×3): qty 1

## 2023-03-26 MED ORDER — TRAZODONE HCL 50 MG PO TABS
50.0000 mg | ORAL_TABLET | Freq: Every evening | ORAL | Status: DC | PRN
  Administered 2023-04-02 – 2023-04-17 (×13): 50 mg via ORAL
  Filled 2023-03-26 (×14): qty 1

## 2023-03-26 MED ORDER — REVEFENACIN 175 MCG/3ML IN SOLN
175.0000 ug | Freq: Every day | RESPIRATORY_TRACT | Status: DC
  Administered 2023-03-26 – 2023-04-20 (×25): 175 ug via RESPIRATORY_TRACT
  Filled 2023-03-26 (×26): qty 3

## 2023-03-26 MED ORDER — OXYCODONE HCL 5 MG PO TABS
5.0000 mg | ORAL_TABLET | ORAL | Status: DC | PRN
  Administered 2023-03-29 – 2023-04-02 (×2): 5 mg via ORAL
  Filled 2023-03-26 (×2): qty 1

## 2023-03-26 MED ORDER — LEVALBUTEROL HCL 1.25 MG/0.5ML IN NEBU
1.2500 mg | INHALATION_SOLUTION | Freq: Four times a day (QID) | RESPIRATORY_TRACT | Status: DC | PRN
  Administered 2023-03-27 – 2023-04-17 (×6): 1.25 mg via RESPIRATORY_TRACT
  Filled 2023-03-26 (×6): qty 0.5

## 2023-03-26 MED ORDER — BISOPROLOL FUMARATE 5 MG PO TABS
5.0000 mg | ORAL_TABLET | Freq: Every day | ORAL | Status: DC
  Administered 2023-03-26 – 2023-03-28 (×3): 5 mg via ORAL
  Filled 2023-03-26 (×4): qty 1

## 2023-03-26 MED ORDER — VANCOMYCIN HCL 1250 MG/250ML IV SOLN
1250.0000 mg | Freq: Once | INTRAVENOUS | Status: AC
  Administered 2023-03-26: 1250 mg via INTRAVENOUS
  Filled 2023-03-26: qty 250

## 2023-03-26 MED ORDER — ALPRAZOLAM 0.5 MG PO TABS
0.5000 mg | ORAL_TABLET | Freq: Once | ORAL | Status: AC
  Administered 2023-03-26: 0.5 mg via ORAL
  Filled 2023-03-26: qty 1

## 2023-03-26 MED ORDER — SENNOSIDES-DOCUSATE SODIUM 8.6-50 MG PO TABS: 1.0000 | ORAL_TABLET | Freq: Every evening | ORAL | Status: DC | PRN

## 2023-03-26 MED ORDER — ONDANSETRON HCL 4 MG/2ML IJ SOLN: 4.0000 mg | Freq: Four times a day (QID) | INTRAMUSCULAR | Status: DC | PRN

## 2023-03-26 MED ORDER — FUROSEMIDE 10 MG/ML IJ SOLN
20.0000 mg | Freq: Once | INTRAMUSCULAR | Status: AC
  Administered 2023-03-26: 20 mg via INTRAVENOUS
  Filled 2023-03-26: qty 4

## 2023-03-26 MED ORDER — HYDRALAZINE HCL 20 MG/ML IJ SOLN: 10.0000 mg | INTRAMUSCULAR | Status: DC | PRN

## 2023-03-26 NOTE — ED Notes (Signed)
Assumed care from Elizabeth,RN. Pt resting comfortably in bed at this time. Pt denies any current needs or questions. Call light with in reach.   

## 2023-03-26 NOTE — Progress Notes (Signed)
ANTICOAGULATION CONSULT NOTE  Pharmacy Consult for heparin infusion Indication: ACS/STEMI/ apixaban for Afib PTA  Allergies  Allergen Reactions   Cefdinir Diarrhea   Saxagliptin Diarrhea   Epinephrine Other (See Comments)    Patient does not remember what happens when she uses this   Evolocumab     Pain with injection   Atorvastatin Other (See Comments)    Muscle aches   Codeine Other (See Comments)    Upset stomach   Ezetimibe Other (See Comments)    Myalgias(ZETIA)   Limonene Rash    Patient does not recall this reaction   Nitrofurantoin Rash and Other (See Comments)    Pruitus   Sulfa Antibiotics Rash and Other (See Comments)    Sore mouth     Patient Measurements: Height: 5' (152.4 cm) Weight: 56.2 kg (124 lb) IBW/kg (Calculated) : 45.5 Heparin Dosing Weight: 56.2 kg  Vital Signs: Temp: 98.2 F (36.8 C) (05/31 1430) Temp Source: Oral (05/31 0500) BP: 129/67 (05/31 1430) Pulse Rate: 103 (05/31 1430)  Labs: Recent Labs    03/25/23 1001 03/25/23 1209 03/25/23 1857 03/25/23 2156 03/26/23 0444 03/26/23 0700 03/26/23 1428  HGB 10.9*  --   --   --  10.3*  --   --   HCT 33.7*  --   --   --  31.7*  --   --   PLT 268  --   --   --  264  --   --   APTT  --   --   --   --   --  36 95*  LABPROT  --   --   --   --   --  17.5*  --   INR  --   --   --   --   --  1.4*  --   CREATININE 1.43*   < > 2.00*  --  2.03*  --  2.36*  TROPONINIHS 25*   < > 225* 458* 1,186*  --  1,165*   < > = values in this interval not displayed.     Estimated Creatinine Clearance: 13.5 mL/min (A) (by C-G formula based on SCr of 2.36 mg/dL (H)).   Medical History: Past Medical History:  Diagnosis Date   Acute CHF (congestive heart failure) (HCC) 02/18/2020   Acute diastolic CHF (congestive heart failure) (HCC)    Acute kidney injury superimposed on CKD (HCC)    Allergies    Anxiety    Arthritis    spine and shoulder   Atherosclerosis of artery of extremity with rest pain (HCC)  10/12/2018   Cancer (HCC)    skin   CHF (congestive heart failure) (HCC)    Diabetes mellitus without complication (HCC)    Diverticulosis of large intestine with hemorrhage    GI bleeding 12/25/2019   Heart murmur    Hyperlipidemia    Hypertension    Macula lutea degeneration    Mitral and aortic valve disease    Myocardial infarction (HCC)    may have had a "light" heart attack   Non-ST elevation (NSTEMI) myocardial infarction (HCC)    Occasional tremors    PAD (peripheral artery disease) (HCC)    Rectal bleeding    Shingles    patient unaware but daughter confirms. it was a long time ago   Stroke [facility]) 01/2017   may have had a slight stroke   TIA (transient ischemic attack) 01/2017   UTI (urinary tract infection)    Vascular disease,  peripheral (HCC)     Medications:  PTA Meds: Apixaban 2.5 mg BID, last dose 5/30 @ 1856  Assessment: Pt is a 88 yo female initially presenting to ED w/ COPD exacerbation, now c/o increase WOB , found with increasing Troponin I levels.  5/31 1428 aPTT=95 Therapeutic x1   Goal of Therapy:  Heparin level 0.3-0.7 units/ml aPTT 66-102 seconds Monitor platelets by anticoagulation protocol: Yes   Plan:  5/31 1428 aPTT=95 Therapeutic x1 Continue heparin infusion at 750 units/hr Will follow aPTT until correlation w/ HL confirmed Will check confirmatory aPTT in 8 hr  HL & CBC daily while on heparin  Bari Mantis PharmD Clinical Pharmacist 03/26/2023

## 2023-03-26 NOTE — ED Notes (Signed)
Pt placed back on bipap due to increased work of breathing and o2 saturation 89%.

## 2023-03-26 NOTE — Progress Notes (Signed)
ANTICOAGULATION CONSULT NOTE  Pharmacy Consult for heparin infusion Indication: ACS/STEMI/ apixaban for Afib PTA  Allergies  Allergen Reactions   Cefdinir Diarrhea   Saxagliptin Diarrhea   Epinephrine Other (See Comments)    Patient does not remember what happens when she uses this   Evolocumab     Pain with injection   Atorvastatin Other (See Comments)    Muscle aches   Codeine Other (See Comments)    Upset stomach   Ezetimibe Other (See Comments)    Myalgias(ZETIA)   Limonene Rash    Patient does not recall this reaction   Nitrofurantoin Rash and Other (See Comments)    Pruitus   Sulfa Antibiotics Rash and Other (See Comments)    Sore mouth     Patient Measurements: Height: 5' (152.4 cm) Weight: 56.2 kg (124 lb) IBW/kg (Calculated) : 45.5 Heparin Dosing Weight: 56.2 kg  Vital Signs: Temp: 98.4 F (36.9 C) (05/31 2030) BP: 107/55 (05/31 2130) Pulse Rate: 84 (05/31 2130)  Labs: Recent Labs    03/25/23 1001 03/25/23 1209 03/25/23 1857 03/25/23 2156 03/26/23 0444 03/26/23 0700 03/26/23 1428 03/26/23 1711 03/26/23 2228  HGB 10.9*  --   --   --  10.3*  --   --   --   --   HCT 33.7*  --   --   --  31.7*  --   --   --   --   PLT 268  --   --   --  264  --   --   --   --   APTT  --   --   --   --   --  36 95*  --  120*  LABPROT  --   --   --   --   --  17.5*  --   --   --   INR  --   --   --   --   --  1.4*  --   --   --   CREATININE 1.43*   < > 2.00*  --  2.03*  --  2.36*  --   --   TROPONINIHS 25*   < > 225*   < > 1,186*  --  1,165* 1,263*  --    < > = values in this interval not displayed.     Estimated Creatinine Clearance: 13.5 mL/min (A) (by C-G formula based on SCr of 2.36 mg/dL (H)).   Medical History: Past Medical History:  Diagnosis Date   Acute CHF (congestive heart failure) (HCC) 02/18/2020   Acute diastolic CHF (congestive heart failure) (HCC)    Acute kidney injury superimposed on CKD (HCC)    Allergies    Anxiety    Arthritis    spine  and shoulder   Atherosclerosis of artery of extremity with rest pain (HCC) 10/12/2018   Cancer (HCC)    skin   CHF (congestive heart failure) (HCC)    Diabetes mellitus without complication (HCC)    Diverticulosis of large intestine with hemorrhage    GI bleeding 12/25/2019   Heart murmur    Hyperlipidemia    Hypertension    Macula lutea degeneration    Mitral and aortic valve disease    Myocardial infarction (HCC)    may have had a "light" heart attack   Non-ST elevation (NSTEMI) myocardial infarction (HCC)    Occasional tremors    PAD (peripheral artery disease) (HCC)    Rectal bleeding  Shingles    patient unaware but daughter confirms. it was a long time ago   Stroke [facility]) 01/2017   may have had a slight stroke   TIA (transient ischemic attack) 01/2017   UTI (urinary tract infection)    Vascular disease, peripheral (HCC)     Medications:  PTA Meds: Apixaban 2.5 mg BID, last dose 5/30 @ 1856  Assessment: Pt is a 88 yo female initially presenting to ED w/ COPD exacerbation, now c/o increase WOB , found with increasing Troponin I levels.  5/31 1428 aPTT=95 Therapeutic x1 5/31 2228 aPTT=120, supratherapeutic   Goal of Therapy:  Heparin level 0.3-0.7 units/ml aPTT 66-102 seconds Monitor platelets by anticoagulation protocol: Yes   Plan:  Decrease heparin infusion to 650 units/hr Will follow aPTT until correlation w/ HL confirmed Will recheck aPTT in 8 hr after rate change HL & CBC daily while on heparin  Otelia Sergeant, PharmD, [facility] 03/26/2023 11:21 PM

## 2023-03-26 NOTE — Consult Note (Signed)
Cardiology Consultation   Patient ID: Jill Hudson MRN: [id number]; DOB: 1935/09/12  Admit date: 03/25/2023 Date of Consult: 03/26/2023  PCP:  Jill Milan, MD   Jill Hudson Providers Cardiologist:  Jill Hudson  Patient Profile:   Jill Hudson is a 88 y.o. female with a hx of hypertension, hyperlipidemia, CAD, PAD, CKD stage III, A-fib, diabetes, history of stroke, HFpEF who is being seen 03/26/2023 for the evaluation of heart failure and A-fib at the request of Dr. Nelson Hudson.  History of Present Illness:   [name] is followed by Jill Hudson.  Patient has a history of PAD with multiple revascularizations.  Peripheral angiography October 2018 with angioplasty of the right common iliac artery and left common femoral artery.  Renal artery duplex in 2019 showed less than 60% stenosis of the left renal artery.  In December 2019 she had redo left femoral endarterectomy and SFA angioplasty.  Patient had a left heart cath in 1998 showing 40% LM, 95% ramus intermedius, minor LAD and RCA plaquing.  She had a right heart cath in April 2021 showing a 3, PA 37/11 (mean 24), wedge 13.  Myocardial perfusion study in May 2021 was normal.  He had carotid Dopplers in January 2021 that showed 40 to 59% RICA, 40-59% L ICA stenosis. Echo in March 2023 showed normal EF with grade 1 diastolic dysfunction, aortic sclerosis.  She has a history of paroxysmal A-fib on Eliquis 2.5 mg twice daily.  Patient follows with nephrology for CKD stage III.  Patient was last seen at [facility] in March 2024.  She was overall doing well.  Unable to tolerate Jill Hudson.  Patient presented to the ER on 03/25/2023 with shortness of breath.  She reported progressive shortness of breath for the last few days.  Came worse overnight and woke up extremely short of breath.  EMS was called who noted O2 of 86% on room air.  They gave her DuoNebs and Solu-Medrol and route.  Patient denied any chest pain.  In the ER blood pressure was  131/62, pulse rate 113 bpm, respiratory rate 28, afebrile.  Labs showed WBC 12, BNP 971, negative COVID, baseline renal function, sodium 127, K 4.5.  High-sensitivity troponin 225, 458, 1186.  Thick acid 6.7.  EKG showed sinus tachycardia with a heart rate of 113 bpm.  Chest x-ray was concerning for infection,, b/l pleural effusions and mild pulmonary vascular congestion without frank edema. The patient was given IV lasix 40mg , started on IV heparin antibiotics and IV steroids and admitted for further work-up.   The patient lives with her daughter and uses a walker. She is not very functional at baseline.   Past Medical History:  Diagnosis Date   Acute CHF (congestive heart failure) (HCC) 02/18/2020   Acute diastolic CHF (congestive heart failure) (HCC)    Acute kidney injury superimposed on CKD (HCC)    Allergies    Anxiety    Arthritis    spine and shoulder   Atherosclerosis of artery of extremity with rest pain (HCC) 10/12/2018   Cancer (HCC)    skin   CHF (congestive heart failure) (HCC)    Diabetes mellitus without complication (HCC)    Diverticulosis of large intestine with hemorrhage    GI bleeding 12/25/2019   Heart murmur    Hyperlipidemia    Hypertension    Macula lutea degeneration    Mitral and aortic valve disease    Myocardial infarction (HCC)    may have had a "light"  heart attack   Non-ST elevation (NSTEMI) myocardial infarction [facility])    Occasional tremors    PAD (peripheral artery disease) (HCC)    Rectal bleeding    Shingles    patient unaware but daughter confirms. it was a long time ago   Stroke [facility]) 01/2017   may have had a slight stroke   TIA (transient ischemic attack) 01/2017   UTI (urinary tract infection)    Vascular disease, peripheral (HCC)     Past Surgical History:  Procedure Laterality Date   CARDIAC CATHETERIZATION  1998   40% LM, 95% Ramus interm   CATARACT EXTRACTION, BILATERAL     COLONOSCOPY WITH PROPOFOL N/A 08/12/2018   Procedure:  COLONOSCOPY WITH PROPOFOL;  Surgeon: Jill Minium, MD;  Location: [facility];  Service: Endoscopy;  Laterality: N/A;   ENDARTERECTOMY FEMORAL Left 10/12/2018   Procedure: ENDARTERECTOMY FEMORAL;  Surgeon: Renford Dills, MD;  Location: [facility] ORS;  Service: Vascular;  Laterality: Left;  angioplasty and left SFA stent placement   EYE SURGERY Bilateral    cataract extractions   LOWER EXTREMITY ANGIOGRAPHY Left 08/23/2017   Procedure: Lower Extremity Angiography;  Surgeon: Annice Needy, MD;  Location: [facility];  Service: Cardiovascular;  Laterality: Left;   LOWER EXTREMITY ANGIOGRAPHY Left 07/26/2018   Procedure: LOWER EXTREMITY ANGIOGRAPHY;  Surgeon: Renford Dills, MD;  Location: [facility];  Service: Cardiovascular;  Laterality: Left;   LOWER EXTREMITY ANGIOGRAPHY Left 09/16/2018   Procedure: LOWER EXTREMITY ANGIOGRAPHY;  Surgeon: Renford Dills, MD;  Location: [facility];  Service: Cardiovascular;  Laterality: Left;   PTCA  08/2013   Left common iliac   PTCA  12/2012   left ext iliac   RIGHT HEART CATH N/A 02/23/2020   Procedure: RIGHT HEART CATH;  Surgeon: Antonieta Iba, MD;  Location: [facility];  Service: Cardiovascular;  Laterality: N/A;   TUBAL LIGATION       Home Medications:  Prior to Admission medications   Medication Sig Start Date End Date Taking? Authorizing Provider  acetaminophen (TYLENOL) 650 MG CR tablet Take 1,300 mg by mouth every 8 (eight) hours as needed for pain.   Yes [provider]  albuterol (VENTOLIN HFA) 108 (90 Base) MCG/ACT inhaler Inhale 2 puffs into the lungs every 6 (six) hours as needed for wheezing or shortness of breath. 01/01/23  Yes Jill Milan, MD  amLODipine (NORVASC) 5 MG tablet Take 5 mg by mouth in the morning and at bedtime. 09/03/20  Yes [provider]  apixaban (ELIQUIS) 2.5 MG TABS tablet TAKE 1 TABLET(2.5 MG) BY MOUTH TWICE DAILY 03/24/22  Yes Jill Milan, MD   cloNIDine (CATAPRES) 0.1 MG tablet Take 0.1 mg by mouth 2 (two) times daily as needed. 08/19/21  Yes [provider]  Cyanocobalamin 1000 MCG TBCR Take 1,000 mcg by mouth daily.   Yes [provider]  ferrous sulfate 325 (65 FE) MG tablet Take 325 mg by mouth daily with breakfast.   Yes [provider]  Fluticasone-Umeclidin-Vilant (TRELEGY ELLIPTA) 100-62.5-25 MCG/ACT AEPB Inhale 1 puff into the lungs daily. 02/19/23  Yes Jill Milan, MD  gabapentin (NEURONTIN) 100 MG capsule TAKE 1 CAPSULE(100 MG) BY MOUTH AT BEDTIME 09/24/22  Yes Jill Milan, MD  insulin glargine (LANTUS) 100 UNIT/ML injection Inject 0.05 mLs (5 Units total) into the skin daily. 03/24/22  Yes Jill Milan, MD  Insulin Lispro w/ Trans Port 100 UNIT/ML SOPN 3 Units  daily. ( Humalog ) 04/08/21  Yes [provider]  ipratropium-albuterol (DUONEB) 0.5-2.5 (3) MG/3ML SOLN Take 3 mLs by nebulization every 6 (six) hours as needed. 02/19/23  Yes Jill Milan, MD  losartan (COZAAR) 25 MG tablet Take 25 mg by mouth 2 (two) times daily. Unsure of dosage   Yes [provider]  melatonin 5 MG TABS Take 0.5 tablets (2.5 mg total) by mouth at bedtime as needed (sleep). 02/26/20  Yes Wieting, Richard, MD  Multiple Vitamins-Minerals (PRESERVISION AREDS PO) Take 1 capsule by mouth 2 (two) times daily.    Yes [provider]  promethazine-dextromethorphan (PROMETHAZINE-DM) 6.25-15 MG/5ML syrup TAKE 5 ML BY MOUTH EVERY NIGHT AT BEDTIME 03/19/23  Yes Jill Milan, MD  rosuvastatin (CRESTOR) 10 MG tablet Take 1 tablet (10 mg total) by mouth 4 (four) times a week. Monday, Wed, Friday and Saturday 01/02/23  Yes Jill Milan, MD  torsemide (DEMADEX) 20 MG tablet Take 2 tablets (40 mg total) by mouth daily. Patient taking differently: Take 40 mg by mouth 2 (two) times daily. 1/2 tablet morning and 1/2 tablet at night. 02/27/20  Yes Wieting, Richard, MD  feeding supplement, GLUCERNA  SHAKE, (GLUCERNA SHAKE) LIQD Take 237 mLs by mouth 3 (three) times daily between meals. 12/28/19   Alford Highland, MD  Insulin Pen Needle (B-D ULTRAFINE III SHORT PEN) 31G X 8 MM MISC USE AS DIRECTED 03/24/22   Jill Milan, MD    Inpatient Medications: Scheduled Meds:  amLODipine  5 mg Oral Daily   cyanocobalamin  1,000 mcg Oral Daily   promethazine  6.25 mg Oral QHS   And   dextromethorphan  15 mg Oral QHS   feeding supplement (GLUCERNA SHAKE)  237 mL Oral TID BM   ferrous sulfate  325 mg Oral Q breakfast   gabapentin  100 mg Oral QHS   insulin aspart  0-5 Units Subcutaneous QHS   insulin aspart  0-9 Units Subcutaneous TID WC   insulin glargine-yfgn  4 Units Subcutaneous Daily   ipratropium-albuterol  3 mL Nebulization Q4H   methylPREDNISolone (SOLU-MEDROL) injection  40 mg Intravenous Q12H   multivitamin  1 tablet Oral BID   rosuvastatin  10 mg Oral Once per day on Mon Wed Fri Sat   sodium chloride  1 g Oral BID WC   Continuous Infusions:  azithromycin Stopped (03/25/23 1330)   cefTRIAXone (ROCEPHIN)  IV Stopped (03/25/23 1217)   heparin 750 Units/hr (03/26/23 0659)   PRN Meds: acetaminophen, albuterol, dextromethorphan-guaiFENesin, diphenhydrAMINE, hydrALAZINE, melatonin, metoprolol tartrate  Allergies:    Allergies  Allergen Reactions   Cefdinir Diarrhea   Saxagliptin Diarrhea   Epinephrine Other (See Comments)    Patient does not remember what happens when she uses this   Evolocumab     Pain with injection   Atorvastatin Other (See Comments)    Muscle aches   Codeine Other (See Comments)    Upset stomach   Ezetimibe Other (See Comments)    Myalgias(ZETIA)   Limonene Rash    Patient does not recall this reaction   Nitrofurantoin Rash and Other (See Comments)    Pruitus   Sulfa Antibiotics Rash and Other (See Comments)    Sore mouth     Social History:   Social History   Socioeconomic History   Marital status: Widowed    Spouse name: kermit   Number  of children: 3   Years of education: Not on file   Highest education level: 12th grade  Occupational History   Occupation: Retired    Comment: homemaker  Tobacco Use   Smoking status: Former    Packs/day: 2.00    Years: 20.00    Additional pack years: 0.00    Total pack years: 40.00    Types: Cigarettes    Quit date: 1980    Years since quitting: 44.4   Smokeless tobacco: Never   Tobacco comments:    smoking cessation materials not required  Vaping Use   Vaping Use: Never used  Substance and Sexual Activity   Alcohol use: No    Alcohol/week: 0.0 standard drinks of alcohol   Drug use: No   Sexual activity: Not Currently  Other Topics Concern   Not on file  Social History Narrative   Husband has dementia and may wander. Requires constant supervision. Pt's daughter also lives with them. Her son [name] is a Insurance account manager.    Social Determinants of Health   Financial Resource Strain: Low Risk  (01/01/2023)   Overall Financial Resource Strain (CARDIA)    Difficulty of Paying Living Expenses: Not hard at all  Food Insecurity: No Food Insecurity (03/25/2023)   Hunger Vital Sign    Worried About Running Out of Food in the Last Year: Never true    Ran Out of Food in the Last Year: Never true  Transportation Needs: No Transportation Needs (03/25/2023)   PRAPARE - Administrator, Civil Service (Medical): No    Lack of Transportation (Non-Medical): No  Physical Activity: Insufficiently Active (03/11/2022)   Exercise Vital Sign    Days of Exercise per Week: 3 days    Minutes of Exercise per Session: 10 min  Stress: No Stress Concern Present (03/11/2022)   [employer] of Occupational Health - Occupational Stress Questionnaire    Feeling of Stress : Not at all  Social Connections: Socially Integrated (05/12/2022)   Social Connection and Isolation Panel [NHANES]    Frequency of Communication with Friends and Family: More than three times a week    Frequency of Social  Gatherings with Friends and Family: More than three times a week    Attends Religious Services: More than 4 times per year    Active Member of [employer] or Organizations: Yes    Attends Engineer, structural: More than 4 times per year    Marital Status: Married  Catering manager Violence: Not At Risk (03/25/2023)   Humiliation, Afraid, Rape, and Kick questionnaire    Fear of Current or Ex-Partner: No    Emotionally Abused: No    Physically Abused: No    Sexually Abused: No    Family History:    Family History  Problem Relation Age of Onset   Dementia Mother    Diabetes Father      ROS:  Please see the history of present illness.   All other ROS reviewed and negative.     Physical Exam/Data:   Vitals:   03/26/23 0100 03/26/23 0130 03/26/23 0400 03/26/23 0500  BP: (!) 141/70 (!) 145/63 (!) 122/52   Pulse: 98 (!) 101 95   Resp: 20 17 (!) 24   Temp:  (!) 97.4 F (36.3 C)  98.9 F (37.2 C)  TempSrc:  Axillary  Oral  SpO2: 98% 96% 98%   Weight:      Height:       No intake or output data in the 24 hours ending 03/26/23 0749    03/25/2023    9:31 AM 02/19/2023  1:42 PM 02/12/2023    9:41 AM  Last 3 Weights  Weight (lbs) 124 lb 122 lb 123 lb  Weight (kg) 56.246 kg 55.339 kg 55.792 kg     Body mass index is 24.22 kg/m.  General:  Well nourished, well developed, in no acute distress HEENT: normal Neck: no JVD Vascular: No carotid bruits; Distal pulses 2+ bilaterally Cardiac:  normal S1, S2; RR, tachycardia; no murmur  Lungs:diffusely diminished with wheezing  Abd: soft, nontender, no hepatomegaly  Ext: no edema Musculoskeletal:  No deformities, BUE and BLE strength normal and equal Skin: warm and dry  Neuro:  CNs 2-12 intact, no focal abnormalities noted Psych:  Normal affect   EKG:  The EKG was personally reviewed and demonstrates:  ST 113bpm poor quality, nonspecific St.T wave changes Telemetry:  Telemetry was personally reviewed and demonstrates:  ST He  100-120s  Relevant CV Studies:  Echo 12/2021 Summary   1. The left ventricle is normal in size with normal wall thickness.    2. The left ventricular systolic function is normal, LVEF is visually  estimated at > 55%.    3. There is grade I diastolic dysfunction (impaired relaxation).    4. The mitral valve leaflets are mildly thickened with normal leaflet  mobility.   5. The aortic valve is trileaflet with mildly thickened leaflets with normal  excursion.   6. The right ventricle is normal in size, with normal systolic function.   Cath / PCI: Right heart catheterization 02/23/2020: RA 3, PA 37/11 (mean 24), wedge 13 04/1997 [facility]: 40% LM; 95% Ramus intermedius; minor LAD and RCA plaquing  CV Surgery: 09/2018 [facility] - re-do L femoral art endarerectomy and SFA angioplasty Peripheral angiography 10/18: Angioplasty of right common iliac artery and left common femoral artery  EP Procedures and Devices: none   Non-Invasive Evaluation(s): TTE 3/23: Normal EF, grade 1 diastolic dysfunction, aortic sclerosis Myocardial perfusion study 5/21: Normal TTE 4/21: Normal EF, LVH, grade 2 diastolic dysfunction, moderate MR, mild aortic stenosis Carotid Doppler January '21 [facility]-40-59% R ICA; 40-59% L ICA stenoses Carotid doppler Jan '20 [facility] - 40-59% R ICA; 1-39% L ICA stenoses Renal artery duplex 5/19: Less than 60% stenosis left renal artery. Elevated velocities bilateral common iliac arteries, patient has known PAD with history of multiple revascularizations TTE 4/18: Normal EF, no significant valvular disease reported TTE (CH[facility]) 02/25/12: Normal EF, mild MR, mild TR   Laboratory Data:  High Sensitivity Troponin:   Recent Labs  Lab 03/25/23 1001 03/25/23 1209 03/25/23 1857 03/25/23 2156 03/26/23 0444  TROPONINIHS 25* 44* 225* 458* 1,186*     Chemistry Recent Labs  Lab 03/25/23 1318 03/25/23 1857 03/26/23 0444  NA 125* 125* 125*  K 4.3 4.2 4.5  CL  96* 95* 96*  CO2 12* 13* 18*  GLUCOSE 364* 338* 264*  BUN 38* 44* 48*  CREATININE 1.70* 2.00* 2.03*  CALCIUM 8.2* 8.5* 8.8*  GFRNONAA 29* 24* 23*  ANIONGAP 17* 17* 11    Recent Labs  Lab 03/25/23 1001  PROT 6.2*  ALBUMIN 3.5  AST 22  ALT 16  ALKPHOS 73  BILITOT 0.6   Lipids  Recent Labs  Lab 03/26/23 0444  CHOL 117  TRIG 53  HDL 65  LDLCALC 41  CHOLHDL 1.8    Hematology Recent Labs  Lab 03/25/23 1001 03/26/23 0444  WBC 12.0* 20.7*  RBC 3.81* 3.62*  HGB 10.9* 10.3*  HCT 33.7* 31.7*  MCV 88.5 87.6  MCH 28.6 28.5  MCHC 32.3 32.5  RDW 12.4 12.8  PLT 268 264   Thyroid No results for input(s): "TSH", "FREET4" in the last 168 hours.  BNP Recent Labs  Lab 03/25/23 1001  BNP 971.3*    DDimer No results for input(s): "DDIMER" in the last 168 hours.   Radiology/Studies:  DG Chest Portable 1 View  Result Date: 03/25/2023 CLINICAL DATA:  Respiratory distress. EXAM: PORTABLE CHEST 1 VIEW COMPARISON:  Two-view chest x-ray 05/01/2021 FINDINGS: The heart size is normal. Atherosclerotic calcifications are present at the aortic arch. Asymmetric interstitial and airspace opacities are present at the right base. Right pleural effusion is present. The smaller left pleural effusion is also suspected. Mild pulmonary vascular congestion is present without frank edema in the left lung. Asymmetric degenerative changes are present in the right shoulder. IMPRESSION: 1. Asymmetric interstitial and airspace opacities at the right base with bilateral pleural effusions. Findings are concerning for infection. 2. Mild pulmonary vascular congestion without frank edema. Electronically Signed   By: Marin Roberts M.D.   On: 03/25/2023 10:16     Assessment and Plan:   Acute on chronic HFpEF - admitted with severe sepsis, COPD exacerbation, possible PNA, CHF - followed at [facility] - prior echo with normal LVEF, G1DD - PTA torsemide 40mg  daily. She reports compliance with this. - BNP  971 - CXR with b/l pleural effusions, infection and pulmonary congestion - s/p IV lasix 40mg  in the ER - Scr/Bun rising. Would hold further diuresis. No overt lower leg edema on exam. Consider nephrology consult. May ultimately end up needing more diuresus  NSTEMI Elevated troponin CAD - troponins elevated to >1000 - no chest pain reported - IV heparin - check echo - remote cath with 40% LM; 95% Ramus intermedius; minor LAD and RCA plaquing - may be supply demand mismatch, however may end up needing ischemic eval pending echo  H/o paroxysmal Afib  - not convinced patient is is rapid Afib - she has 1st degree AV block. Rates are around 100 - continue IV heparin - PTA Eliquis 2.5mg  BID  Severe Sepsis COPD exacerbation ?PNA - LA up to 6.7 - abx per IM - per IM  HTN - Bps are normal  H/o CVA - IV heparin and statin  AKI on CKD stage 3 - Scr 2.03/BUN 44 - up from 1.43 on admission - hold further diuresis  For questions or updates, please contact [facility] Please consult [email or web address] for contact info under    Signed, Leshonda Galambos David Stall, PA-C  03/26/2023 7:49 AM

## 2023-03-26 NOTE — ED Notes (Signed)
IV dressing changed at this time.

## 2023-03-26 NOTE — Progress Notes (Addendum)
PROGRESS NOTE    Jill Hudson  AOZ:[id number] DOB: 03/14/1935 DOA: 03/25/2023 PCP: Reubin Milan, MD   Brief Narrative:  88 year old with history of COPD, HTN, HLD, DM2, stroke, TIA, GERD, anxiety, PVD, CAD, MI, former tobacco use, CKD stage III, GI bleed, iron deficiency anemia, PAD status post PCI comes to the hospital with shortness of breath and hypoxia.  Patient was found to have bilateral pulmonary edema/effusion with elevated BNP.   Assessment & Plan:  Principal Problem:   COPD exacerbation (HCC) Active Problems:   Severe sepsis (HCC)   Acute on chronic diastolic CHF (congestive heart failure) (HCC)   Essential hypertension   History of CVA (cerebrovascular accident)   CAD (coronary artery disease)   Myocardial injury   Atrial fibrillation with RVR (HCC)   Type II diabetes mellitus with renal manifestations (HCC)   Chronic kidney disease, stage 3a (HCC)   Hyponatremia   HLD (hyperlipidemia)   Iron deficiency anemia   PAD (peripheral artery disease) (HCC)      Acute on chronic diastolic CHF (congestive heart failure) (HCC): Class III Previous echocardiogram in March 2023 showed EF of 55%.  Now has clinical evidence of bilateral pulmonary edema/effusion and elevated BNP.  Diuretic guidance per cardiology Will repeat echocardiogram [facility] cardiology  NSTEMI History of CAD -Troponins have significantly trended up.  Currently started on heparin drip.  Will repeat EKG this morning.  Cardiology team consulted by overnight provider.  LDL 41, A1c 7.9.  Continue to trend troponin. Will make patient n.p.o. for now  Hyponatremia: -Suspect from volume overload.    Acute kidney injury on CKD stage III A -Baseline creatinine 1.3.  Today is 2.03.  Closely monitor.  Bladder scan shows 350cc, foley placed for accurate I/Os Dr Cherylann Ratel from [facility]ho consulted, will see the patient tomorrow am.   Lactic acidosis - Repeat lactate  COPD exacerbation  Suspect patient's most  of wheezing is cardiac in nature.  Low suspicion for PE as patient is already on Eliquis home.  Admitting provider has started patient on Solu-Medrol which we will continue along with bronchodilators, I-S and flutter valve.  Low threshold to transition to soft.  Currently on empiric Rocephin and azithromycin, check procalcitonin if negative it can be discontinued   Essential hypertension: -Continue amlodipine.  IV as needed.   History of CVA (cerebrovascular accident) On heparin drip and statin   Atrial fibrillation with RVR (HCC) History of paroxysmal atrial fibrillation -Continue metoprolol and heparin drip   Type II diabetes mellitus with renal manifestations [facility]):  Recent A1c 7.9.  Semglee 4 units daily, sliding scale  HLD (hyperlipidemia) -Crestor   Iron deficiency anemia: Hemoglobin 10.9 (11.2 on 03/24/2022) iron supplement   PAD (peripheral artery disease) (HCC): S/p of a stent placement to the left leg -Patient is on Crestor and Eliquis at home       DVT prophylaxis: Heparin Drip Code Status: Full Code Family Communication:   Status is: Inpatient Acutely ill, continue hospital stay due to multiple medical issues.       Diet Orders (From admission, onward)     Start     Ordered   03/25/23 1201  Diet regular Fluid consistency: Thin; Fluid restriction: 1200 mL Fluid  Diet effective now       Question Answer Comment  Fluid consistency: Thin   Fluid restriction: 1200 mL Fluid      03/25/23 1200            Subjective: Seen and examined  at bedside.  Still remains on BiPAP.   Examination:  General exam: Appears calm and comfortable, on BiPAP Respiratory system: Bilateral rhonchi Cardiovascular system: S1 & S2 heard, RRR. No JVD, murmurs, rubs, gallops or clicks. No pedal edema. Gastrointestinal system: Abdomen is nondistended, soft and nontender. No organomegaly or masses felt. Normal bowel sounds heard. Central nervous system: Alert and oriented. No  focal neurological deficits. Extremities: Symmetric 5 x 5 power. Skin: No rashes, lesions or ulcers Psychiatry: Judgement and insight appear normal. Mood & affect appropriate.  Objective: Vitals:   03/26/23 0100 03/26/23 0130 03/26/23 0400 03/26/23 0500  BP: (!) 141/70 (!) 145/63 (!) 122/52   Pulse: 98 (!) 101 95   Resp: 20 17 (!) 24   Temp:  (!) 97.4 F (36.3 C)  98.9 F (37.2 C)  TempSrc:  Axillary  Oral  SpO2: 98% 96% 98%   Weight:      Height:       No intake or output data in the 24 hours ending 03/26/23 0804 Filed Weights   03/25/23 0931  Weight: 56.2 kg    Scheduled Meds:  amLODipine  5 mg Oral Daily   cyanocobalamin  1,000 mcg Oral Daily   promethazine  6.25 mg Oral QHS   And   dextromethorphan  15 mg Oral QHS   feeding supplement (GLUCERNA SHAKE)  237 mL Oral TID BM   ferrous sulfate  325 mg Oral Q breakfast   gabapentin  100 mg Oral QHS   insulin aspart  0-5 Units Subcutaneous QHS   insulin aspart  0-9 Units Subcutaneous TID WC   insulin glargine-yfgn  4 Units Subcutaneous Daily   ipratropium-albuterol  3 mL Nebulization Q4H   methylPREDNISolone (SOLU-MEDROL) injection  40 mg Intravenous Q12H   multivitamin  1 tablet Oral BID   rosuvastatin  10 mg Oral Once per day on Mon Wed Fri Sat   sodium chloride  1 g Oral BID WC   Continuous Infusions:  azithromycin Stopped (03/25/23 1330)   cefTRIAXone (ROCEPHIN)  IV Stopped (03/25/23 1217)   heparin 750 Units/hr (03/26/23 0659)    Nutritional status     Body mass index is 24.22 kg/m.  Data Reviewed:   CBC: Recent Labs  Lab 03/25/23 1001 03/26/23 0444  WBC 12.0* 20.7*  HGB 10.9* 10.3*  HCT 33.7* 31.7*  MCV 88.5 87.6  PLT 268 264   Basic Metabolic Panel: Recent Labs  Lab 03/25/23 1001 03/25/23 1318 03/25/23 1857 03/26/23 0444  NA 127* 125* 125* 125*  K 3.8 4.3 4.2 4.5  CL 96* 96* 95* 96*  CO2 20* 12* 13* 18*  GLUCOSE 206* 364* 338* 264*  BUN 34* 38* 44* 48*  CREATININE 1.43* 1.70* 2.00*  2.03*  CALCIUM 8.7* 8.2* 8.5* 8.8*   GFR: Estimated Creatinine Clearance: 15.6 mL/min (A) (by C-G formula based on SCr of 2.03 mg/dL (H)). Liver Function Tests: Recent Labs  Lab 03/25/23 1001  AST 22  ALT 16  ALKPHOS 73  BILITOT 0.6  PROT 6.2*  ALBUMIN 3.5   No results for input(s): "LIPASE", "AMYLASE" in the last 168 hours. No results for input(s): "AMMONIA" in the last 168 hours. Coagulation Profile: Recent Labs  Lab 03/26/23 0700  INR 1.4*   Cardiac Enzymes: No results for input(s): "CKTOTAL", "CKMB", "CKMBINDEX", "TROPONINI" in the last 168 hours. BNP (last 3 results) No results for input(s): "PROBNP" in the last 8760 hours. HbA1C: Recent Labs    03/25/23 1318  HGBA1C 7.9*   CBG:  Recent Labs  Lab 03/25/23 1559 03/25/23 2201  GLUCAP 380* 244*   Lipid Profile: Recent Labs    03/26/23 0444  CHOL 117  HDL 65  LDLCALC 41  TRIG 53  CHOLHDL 1.8   Thyroid Function Tests: No results for input(s): "TSH", "T4TOTAL", "FREET4", "T3FREE", "THYROIDAB" in the last 72 hours. Anemia Panel: No results for input(s): "VITAMINB12", "FOLATE", "FERRITIN", "TIBC", "IRON", "RETICCTPCT" in the last 72 hours. Sepsis Labs: Recent Labs  Lab 03/25/23 1209 03/25/23 1318 03/25/23 1548 03/25/23 1748  PROCALCITON 0.16  --   --   --   LATICACIDVEN 2.9* 4.4* 5.5* 6.7*    Recent Results (from the past 240 hour(s))  Blood Culture (routine x 2)     Status: None (Preliminary result)   Collection Time: 03/25/23 12:09 PM   Specimen: BLOOD  Result Value Ref Range Status   Specimen Description BLOOD LEFT AC  Final   Special Requests   Final    BOTTLES DRAWN AEROBIC AND ANAEROBIC Blood Culture adequate volume   Culture   Final    NO GROWTH < 12 HOURS Performed at [facility], [address] [place], [place] [address]    Report Status PENDING  Incomplete  Blood Culture (routine x 2)     Status: None (Preliminary result)   Collection Time: 03/25/23 12:10 PM    Specimen: BLOOD  Result Value Ref Range Status   Specimen Description BLOOD LEFT FA  Final   Special Requests   Final    BOTTLES DRAWN AEROBIC AND ANAEROBIC Blood Culture adequate volume   Culture   Final    NO GROWTH < 12 HOURS Performed at [facility], [address] [place], [place] [address]    Report Status PENDING  Incomplete  SARS Coronavirus 2 by RT PCR (hospital order, performed in [facility] hospital lab) *cepheid single result test* Anterior Nasal Swab     Status: None   Collection Time: 03/25/23  1:18 PM   Specimen: Anterior Nasal Swab  Result Value Ref Range Status   SARS Coronavirus 2 by RT PCR NEGATIVE NEGATIVE Final    Comment: (NOTE) SARS-CoV-2 target nucleic acids are NOT DETECTED.  The SARS-CoV-2 RNA is generally detectable in upper and lower respiratory specimens during the acute phase of infection. The lowest concentration of SARS-CoV-2 viral copies this assay can detect is 250 copies / mL. A negative result does not preclude SARS-CoV-2 infection and should not be used as the sole basis for treatment or other patient management decisions.  A negative result may occur with improper specimen collection / handling, submission of specimen other than nasopharyngeal swab, presence of viral mutation(s) within the areas targeted by this assay, and inadequate number of viral copies (<250 copies / mL). A negative result must be combined with clinical observations, patient history, and epidemiological information.  Fact Sheet for Patients:   RoadLapTop.co.za  Fact Sheet for Healthcare Providers: http://kim-miller.com/  This test is not yet approved or  cleared by the [place] FDA and has been authorized for detection and/or diagnosis of SARS-CoV-2 by FDA under an Emergency Use Authorization (EUA).  This EUA will remain in effect (meaning this test can be used) for the duration of the COVID-19 declaration  under Section 564(b)(1) of the Act, 21 U.S.C. section 360bbb-3(b)(1), unless the authorization is terminated or revoked sooner.  Performed at [facility], [address], [place], [place] [address]          Radiology Studies: DG Chest  Portable 1 View  Result Date: 03/25/2023 CLINICAL DATA:  Respiratory distress. EXAM: PORTABLE CHEST 1 VIEW COMPARISON:  Two-view chest x-ray 05/01/2021 FINDINGS: The heart size is normal. Atherosclerotic calcifications are present at the aortic arch. Asymmetric interstitial and airspace opacities are present at the right base. Right pleural effusion is present. The smaller left pleural effusion is also suspected. Mild pulmonary vascular congestion is present without frank edema in the left lung. Asymmetric degenerative changes are present in the right shoulder. IMPRESSION: 1. Asymmetric interstitial and airspace opacities at the right base with bilateral pleural effusions. Findings are concerning for infection. 2. Mild pulmonary vascular congestion without frank edema. Electronically Signed   By: Marin Roberts M.D.   On: 03/25/2023 10:16           LOS: 1 day   Time spent= 35 mins    Parris Signer Joline Maxcy, MD [facility]  If 7PM-7AM, please contact night-coverage  03/26/2023, 8:04 AM

## 2023-03-26 NOTE — ED Notes (Signed)
Pt placed on bed pan and had bowel movement. Pt had increased work of breathing and states she can't catch her breath. RN instructed pt to focus on breathing through her nose and not her mouth so her oxygen can get to her. Pt states she breathes better through her mouth, RN moved nasal cannula to go through mouth and pt seems to be tolerating better but still endorses being anxious. RN message MD Nelson Chimes to notify.

## 2023-03-26 NOTE — ED Notes (Signed)
IV team at bedside 

## 2023-03-26 NOTE — Consult Note (Signed)
Pharmacy Antibiotic Note  Jill Hudson is a 88 y.o. female with PMH including COPD, HTN, HLD, DM, CVA / TIA, GERD, anxiety, PVD, CAD, MI, former tobacco use admitted on 03/25/2023 with pneumonia / COPDE / CHF.  Pharmacy has been consulted for vancomycin dosing.  Plan:  Vancomycin 1.25 g IV loading dose --Dose per levels given AKI --Daily Scr per protocol  MRSA PCR pending  Height: 5' (152.4 cm) Weight: 56.2 kg (124 lb) IBW/kg (Calculated) : 45.5  Temp (24hrs), Avg:98.3 F (36.8 C), Min:97.4 F (36.3 C), Max:99 F (37.2 C)  Recent Labs  Lab 03/25/23 1001 03/25/23 1209 03/25/23 1318 03/25/23 1548 03/25/23 1748 03/25/23 1857 03/26/23 0444 03/26/23 0850 03/26/23 1110  WBC 12.0*  --   --   --   --   --  20.7*  --   --   CREATININE 1.43*  --  1.70*  --   --  2.00* 2.03*  --   --   LATICACIDVEN  --    < > 4.4* 5.5* 6.7*  --   --  2.8* 2.0*   < > = values in this interval not displayed.    Estimated Creatinine Clearance: 15.6 mL/min (A) (by C-G formula based on SCr of 2.03 mg/dL (H)).    Allergies  Allergen Reactions   Cefdinir Diarrhea   Saxagliptin Diarrhea   Epinephrine Other (See Comments)    Patient does not remember what happens when she uses this   Evolocumab     Pain with injection   Atorvastatin Other (See Comments)    Muscle aches   Codeine Other (See Comments)    Upset stomach   Ezetimibe Other (See Comments)    Myalgias(ZETIA)   Limonene Rash    Patient does not recall this reaction   Nitrofurantoin Rash and Other (See Comments)    Pruitus   Sulfa Antibiotics Rash and Other (See Comments)    Sore mouth     Antimicrobials this admission: Azithromycin 5/30 >>  Ceftriaxone 5/30 >>  Vancomycin 5/31 >>   Dose adjustments this admission: N/A  Microbiology results: 5/30 BCx: NGTD 5/31 MRSA PCR: pending  Thank you for allowing pharmacy to be a part of this patient's care.  Tressie Ellis 03/26/2023 2:53 PM

## 2023-03-26 NOTE — Progress Notes (Signed)
ANTICOAGULATION CONSULT NOTE  Pharmacy Consult for heparin infusion Indication: ACS/STEMI  Allergies  Allergen Reactions   Cefdinir Diarrhea   Saxagliptin Diarrhea   Epinephrine Other (See Comments)    Patient does not remember what happens when she uses this   Evolocumab     Pain with injection   Atorvastatin Other (See Comments)    Muscle aches   Codeine Other (See Comments)    Upset stomach   Ezetimibe Other (See Comments)    Myalgias(ZETIA)   Limonene Rash    Patient does not recall this reaction   Nitrofurantoin Rash and Other (See Comments)    Pruitus   Sulfa Antibiotics Rash and Other (See Comments)    Sore mouth     Patient Measurements: Height: 5' (152.4 cm) Weight: 56.2 kg (124 lb) IBW/kg (Calculated) : 45.5 Heparin Dosing Weight: 56.2 kg  Vital Signs: Temp: 98.9 F (37.2 C) (05/31 0500) Temp Source: Oral (05/31 0500) BP: 122/52 (05/31 0400) Pulse Rate: 95 (05/31 0400)  Labs: Recent Labs    03/25/23 1001 03/25/23 1209 03/25/23 1318 03/25/23 1857 03/25/23 2156 03/26/23 0444  HGB 10.9*  --   --   --   --  10.3*  HCT 33.7*  --   --   --   --  31.7*  PLT 268  --   --   --   --  264  CREATININE 1.43*  --  1.70* 2.00*  --  2.03*  TROPONINIHS 25*   < >  --  225* 458* 1,186*   < > = values in this interval not displayed.    Estimated Creatinine Clearance: 15.6 mL/min (A) (by C-G formula based on SCr of 2.03 mg/dL (H)).   Medical History: Past Medical History:  Diagnosis Date   Acute CHF (congestive heart failure) (HCC) 02/18/2020   Acute diastolic CHF (congestive heart failure) (HCC)    Acute kidney injury superimposed on CKD (HCC)    Allergies    Anxiety    Arthritis    spine and shoulder   Atherosclerosis of artery of extremity with rest pain (HCC) 10/12/2018   Cancer (HCC)    skin   CHF (congestive heart failure) (HCC)    Diabetes mellitus without complication (HCC)    Diverticulosis of large intestine with hemorrhage    GI bleeding  12/25/2019   Heart murmur    Hyperlipidemia    Hypertension    Macula lutea degeneration    Mitral and aortic valve disease    Myocardial infarction (HCC)    may have had a "light" heart attack   Non-ST elevation (NSTEMI) myocardial infarction (HCC)    Occasional tremors    PAD (peripheral artery disease) (HCC)    Rectal bleeding    Shingles    patient unaware but daughter confirms. it was a long time ago   Stroke [facility]) 01/2017   may have had a slight stroke   TIA (transient ischemic attack) 01/2017   UTI (urinary tract infection)    Vascular disease, peripheral (HCC)     Medications:  PTA Meds: Apixaban 2.5 mg BID, last dose 5/30 @ 1856  Assessment: Pt is a 88 yo female initially presenting to ED w/ COPD exacerbation, now c/o increase WOB , found with increasing Troponin I levels.  Goal of Therapy:  Heparin level 0.3-0.7 units/ml aPTT 66-102 seconds Monitor platelets by anticoagulation protocol: Yes   Plan:  No initial bolus Start heparin infusion at 750 units/hr Will follow aPTT until correlation w/  HL confirmed Will check aPTT in 8 hr after start of infusion HL & CBC daily while on heparin  Otelia Sergeant, PharmD, [facility] 03/26/2023 6:31 AM

## 2023-03-26 NOTE — Progress Notes (Signed)
*  PRELIMINARY RESULTS* Echocardiogram 2D Echocardiogram has been performed.  Carolyne Fiscal 03/26/2023, 10:33 AM

## 2023-03-27 DIAGNOSIS — I5041 Acute combined systolic (congestive) and diastolic (congestive) heart failure: Secondary | ICD-10-CM | POA: Diagnosis not present

## 2023-03-27 DIAGNOSIS — N1832 Chronic kidney disease, stage 3b: Secondary | ICD-10-CM | POA: Diagnosis not present

## 2023-03-27 DIAGNOSIS — R079 Chest pain, unspecified: Secondary | ICD-10-CM | POA: Diagnosis present

## 2023-03-27 DIAGNOSIS — N179 Acute kidney failure, unspecified: Secondary | ICD-10-CM | POA: Diagnosis not present

## 2023-03-27 DIAGNOSIS — J441 Chronic obstructive pulmonary disease with (acute) exacerbation: Secondary | ICD-10-CM | POA: Diagnosis not present

## 2023-03-27 DIAGNOSIS — E8721 Acute metabolic acidosis: Secondary | ICD-10-CM | POA: Diagnosis not present

## 2023-03-27 DIAGNOSIS — I129 Hypertensive chronic kidney disease with stage 1 through stage 4 chronic kidney disease, or unspecified chronic kidney disease: Secondary | ICD-10-CM | POA: Diagnosis not present

## 2023-03-27 DIAGNOSIS — E871 Hypo-osmolality and hyponatremia: Secondary | ICD-10-CM | POA: Diagnosis not present

## 2023-03-27 LAB — STREP PNEUMONIAE URINARY ANTIGEN: Strep Pneumo Urinary Antigen: NEGATIVE

## 2023-03-27 LAB — CBC
HCT: 28.8 % — ABNORMAL LOW (ref 36.0–46.0)
Hemoglobin: 9.5 g/dL — ABNORMAL LOW (ref 12.0–15.0)
MCH: 29.1 pg (ref 26.0–34.0)
MCHC: 33 g/dL (ref 30.0–36.0)
MCV: 88.3 fL (ref 80.0–100.0)
Platelets: 235 10*3/uL (ref 150–400)
RBC: 3.26 MIL/uL — ABNORMAL LOW (ref 3.87–5.11)
RDW: 13.1 % (ref 11.5–15.5)
WBC: 21.9 10*3/uL — ABNORMAL HIGH (ref 4.0–10.5)
nRBC: 0 % (ref 0.0–0.2)

## 2023-03-27 LAB — BASIC METABOLIC PANEL
Anion gap: 14 (ref 5–15)
BUN: 60 mg/dL — ABNORMAL HIGH (ref 8–23)
CO2: 15 mmol/L — ABNORMAL LOW (ref 22–32)
Calcium: 8.4 mg/dL — ABNORMAL LOW (ref 8.9–10.3)
Chloride: 97 mmol/L — ABNORMAL LOW (ref 98–111)
Creatinine, Ser: 2.59 mg/dL — ABNORMAL HIGH (ref 0.44–1.00)
GFR, Estimated: 18 mL/min — ABNORMAL LOW (ref >60)
Glucose, Bld: 151 mg/dL — ABNORMAL HIGH (ref 70–99)
Potassium: 4.4 mmol/L (ref 3.5–5.1)
Sodium: 126 mmol/L — ABNORMAL LOW (ref 135–145)

## 2023-03-27 LAB — MAGNESIUM: Magnesium: 2.1 mg/dL (ref 1.7–2.4)

## 2023-03-27 LAB — CBG MONITORING, ED
Glucose-Capillary: 152 mg/dL — ABNORMAL HIGH (ref 70–99)
Glucose-Capillary: 166 mg/dL — ABNORMAL HIGH (ref 70–99)
Glucose-Capillary: 202 mg/dL — ABNORMAL HIGH (ref 70–99)
Glucose-Capillary: 214 mg/dL — ABNORMAL HIGH (ref 70–99)
Glucose-Capillary: 207 mg/dL — ABNORMAL HIGH (ref 70–99)

## 2023-03-27 LAB — HEPARIN LEVEL (UNFRACTIONATED): Heparin Unfractionated: >1.1 IU/mL — ABNORMAL HIGH (ref 0.30–0.70)

## 2023-03-27 LAB — APTT
aPTT: 68 seconds — ABNORMAL HIGH (ref 24–36)
aPTT: 89 seconds — ABNORMAL HIGH (ref 24–36)

## 2023-03-27 LAB — MRSA NEXT GEN BY PCR, NASAL: MRSA by PCR Next Gen: NOT DETECTED

## 2023-03-27 LAB — CULTURE, BLOOD (ROUTINE X 2)

## 2023-03-27 MED ORDER — CLOTRIMAZOLE 1 % VA CREA
1.0000 | TOPICAL_CREAM | Freq: Every day | VAGINAL | Status: AC
  Administered 2023-03-27 – 2023-03-29 (×3): 1 via VAGINAL
  Filled 2023-03-27 (×2): qty 45

## 2023-03-27 MED ORDER — SODIUM BICARBONATE 8.4 % IV SOLN
INTRAVENOUS | Status: AC
  Filled 2023-03-27 (×2): qty 1000
  Filled 2023-03-27: qty 150

## 2023-03-27 NOTE — ED Notes (Signed)
Pt's glucose monitor on left arm came off.

## 2023-03-27 NOTE — Progress Notes (Signed)
Rounding Note    Patient Name: Jill Hudson Date of Encounter: 03/27/2023  [facility] Cardiologist: None   Subjective   Feeling a little better today.  Her breathing is improving.  Inpatient Medications    Scheduled Meds:  arformoterol  15 mcg Nebulization BID   bisoprolol  5 mg Oral Daily   cyanocobalamin  1,000 mcg Oral Daily   promethazine  6.25 mg Oral QHS   And   dextromethorphan  15 mg Oral QHS   feeding supplement (GLUCERNA SHAKE)  237 mL Oral TID BM   ferrous sulfate  325 mg Oral Q breakfast   furosemide  40 mg Oral BID   gabapentin  100 mg Oral QHS   insulin aspart  0-9 Units Subcutaneous Q6H   insulin glargine-yfgn  4 Units Subcutaneous Daily   methylPREDNISolone (SOLU-MEDROL) injection  40 mg Intravenous Daily   multivitamin  1 tablet Oral BID   revefenacin  175 mcg Nebulization Daily   rosuvastatin  10 mg Oral Once per day on Mon Wed Fri Sat   sodium chloride  1 g Oral BID WC   vancomycin variable dose per unstable renal function (pharmacist dosing)   Does not apply See admin instructions   Continuous Infusions:  azithromycin Stopped (03/26/23 1250)   cefTRIAXone (ROCEPHIN)  IV Stopped (03/26/23 1138)   heparin Stopped (03/27/23 0740)   PRN Meds: acetaminophen, dextromethorphan-guaiFENesin, diphenhydrAMINE, hydrALAZINE, levalbuterol, melatonin, metoprolol tartrate, ondansetron (ZOFRAN) IV, oxyCODONE, senna-docusate, traZODone   Vital Signs    Vitals:   03/27/23 0530 03/27/23 0600 03/27/23 0630 03/27/23 0817  BP: (!) 110/56 (!) 110/59 (!) 111/56   Pulse: 78 79 77   Resp: 18 16 18    Temp:      TempSrc:      SpO2: 94% 94% 92% 93%  Weight:      Height:        Intake/Output Summary (Last 24 hours) at 03/27/2023 1106 Last data filed at 03/26/2023 1426 Gross per 24 hour  Intake --  Output 350 ml  Net -350 ml      03/25/2023    9:31 AM 02/19/2023    1:42 PM 02/12/2023    9:41 AM  Last 3 Weights  Weight (lbs) 124 lb 122 lb 123 lb   Weight (kg) 56.246 kg 55.339 kg 55.792 kg      Telemetry    Sinus rhythm.  No events.- Personally Reviewed  ECG    Sinus tachycardia.  Rate 115 bpm.  Inferolateral ST depression - Personally Reviewed  Physical Exam   VS:  BP (!) 111/56   Pulse 77   Temp 98.4 F (36.9 C)   Resp 18   Ht 5' (1.524 m)   Wt 56.2 kg   SpO2 93%   BMI 24.22 kg/m  , BMI Body mass index is 24.22 kg/m. GENERAL: Frail.  No acute distress HEENT: Pupils equal round and reactive, fundi not visualized, oral mucosa unremarkable NECK:  No jugular venous distention, waveform within normal limits, carotid upstroke brisk and symmetric, no bruits, no thyromegaly LUNGS: Diminished breath sounds. HEART:  RRR.  PMI not displaced or sustained,S1 and S2 within normal limits, no S3, no S4, no clicks, no rubs, no murmurs ABD:  Flat, positive bowel sounds normal in frequency in pitch, no bruits, no rebound, no guarding, no midline pulsatile mass, no hepatomegaly, no splenomegaly EXT:  2 plus pulses throughout, no edema, no cyanosis no clubbing SKIN:  No rashes no nodules NEURO:  Cranial  nerves II through XII grossly intact, motor grossly intact throughout [facility]:  Cognitively intact, oriented to person place and time   Labs    High Sensitivity Troponin:   Recent Labs  Lab 03/25/23 1857 03/25/23 2156 03/26/23 0444 03/26/23 1428 03/26/23 1711  TROPONINIHS 225* 458* 1,186* 1,165* 1,263*     Chemistry Recent Labs  Lab 03/25/23 1001 03/25/23 1318 03/26/23 0444 03/26/23 1428 03/27/23 0437  NA 127*   < > 125* 125* 126*  K 3.8   < > 4.5 4.7 4.4  CL 96*   < > 96* 94* 97*  CO2 20*   < > 18* 16* 15*  GLUCOSE 206*   < > 264* 231* 151*  BUN 34*   < > 48* 55* 60*  CREATININE 1.43*   < > 2.03* 2.36* 2.59*  CALCIUM 8.7*   < > 8.8* 8.6* 8.4*  MG  --   --   --   --  2.1  PROT 6.2*  --   --   --   --   ALBUMIN 3.5  --   --   --   --   AST 22  --   --   --   --   ALT 16  --   --   --   --   ALKPHOS 73  --   --    --   --   BILITOT 0.6  --   --   --   --   GFRNONAA 36*   < > 23* 20* 18*  ANIONGAP 11   < > 11 15 14    < > = values in this interval not displayed.    Lipids  Recent Labs  Lab 03/26/23 0444  CHOL 117  TRIG 53  HDL 65  LDLCALC 41  CHOLHDL 1.8    Hematology Recent Labs  Lab 03/25/23 1001 03/26/23 0444 03/27/23 0437  WBC 12.0* 20.7* 21.9*  RBC 3.81* 3.62* 3.26*  HGB 10.9* 10.3* 9.5*  HCT 33.7* 31.7* 28.8*  MCV 88.5 87.6 88.3  MCH 28.6 28.5 29.1  MCHC 32.3 32.5 33.0  RDW 12.4 12.8 13.1  PLT 268 264 235   Thyroid No results for input(s): "TSH", "FREET4" in the last 168 hours.  BNP Recent Labs  Lab 03/25/23 1001  BNP 971.3*    DDimer No results for input(s): "DDIMER" in the last 168 hours.   Radiology    ECHOCARDIOGRAM COMPLETE  Result Date: 03/26/2023    ECHOCARDIOGRAM REPORT   Patient Name:   [name] Date of Exam: 03/26/2023 Medical Rec #:  [id number]     Height:       60.0 in Accession #:    [id number]    Weight:       124.0 lb Date of Birth:  [birth year]      BSA:          1.523 m Patient Age:    88 years      BP:           122/52 mmHg Patient Gender: F             HR:           108 bpm. Exam Location:  [facility] Procedure: 2D Echo, Cardiac Doppler and Color Doppler Indications:     CHF  History:         Patient has prior history of Echocardiogram examinations, most  recent 02/19/2020. CHF, CAD, PAD, COPD and Stroke,                  Arrythmias:Atrial Fibrillation; Risk Factors:Hypertension,                  Diabetes and Dyslipidemia.  Sonographer:     Mikki Harbor Referring Phys:  [id number] Brien Few NIU Diagnosing Phys: Lorine Bears MD IMPRESSIONS  1. Left ventricular ejection fraction, by estimation, is 30 to 35%. The left ventricle has moderately decreased function. Left ventricular endocardial border not optimally defined to evaluate regional wall motion. The left ventricular internal cavity size was mildly dilated. Left ventricular diastolic parameters are  consistent with Grade II diastolic dysfunction (pseudonormalization).  2. Right ventricular systolic function is normal. The right ventricular size is normal. There is mildly elevated pulmonary artery systolic pressure.  3. Left atrial size was mildly dilated.  4. The mitral valve is degenerative. Severe mitral valve regurgitation. No evidence of mitral stenosis.  5. The aortic valve is normal in structure. Aortic valve regurgitation is not visualized. Aortic valve sclerosis/calcification is present, without any evidence of aortic stenosis.  6. The inferior vena cava is normal in size with <50% respiratory variability, suggesting right atrial pressure of 8 mmHg. FINDINGS  Left Ventricle: Left ventricular ejection fraction, by estimation, is 30 to 35%. The left ventricle has moderately decreased function. Left ventricular endocardial border not optimally defined to evaluate regional wall motion. The left ventricular internal cavity size was mildly dilated. There is no left ventricular hypertrophy. Left ventricular diastolic parameters are consistent with Grade II diastolic dysfunction (pseudonormalization). Right Ventricle: The right ventricular size is normal. No increase in right ventricular wall thickness. Right ventricular systolic function is normal. There is mildly elevated pulmonary artery systolic pressure. The tricuspid regurgitant velocity is 2.77  m/s, and with an assumed right atrial pressure of 8 mmHg, the estimated right ventricular systolic pressure is 38.7 mmHg. Left Atrium: Left atrial size was mildly dilated. Right Atrium: Right atrial size was normal in size. Pericardium: There is no evidence of pericardial effusion. Mitral Valve: The mitral valve is degenerative in appearance. There is mild thickening of the mitral valve leaflet(s). There is mild calcification of the mitral valve leaflet(s). Severe mitral valve regurgitation, with posteriorly-directed jet. No evidence of mitral valve stenosis. MV  peak gradient, 10.8 mmHg. The mean mitral valve gradient is 4.0 mmHg. Tricuspid Valve: The tricuspid valve is normal in structure. Tricuspid valve regurgitation is mild . No evidence of tricuspid stenosis. Aortic Valve: The aortic valve is normal in structure. Aortic valve regurgitation is not visualized. Aortic valve sclerosis/calcification is present, without any evidence of aortic stenosis. Aortic valve mean gradient measures 2.0 mmHg. Aortic valve peak  gradient measures 4.1 mmHg. Aortic valve area, by VTI measures 2.36 cm. Pulmonic Valve: The pulmonic valve was normal in structure. Pulmonic valve regurgitation is not visualized. No evidence of pulmonic stenosis. Aorta: The aortic root is normal in size and structure. Venous: The inferior vena cava is normal in size with less than 50% respiratory variability, suggesting right atrial pressure of 8 mmHg. IAS/Shunts: No atrial level shunt detected by color flow Doppler.  LEFT VENTRICLE PLAX 2D LVIDd:         5.20 cm   Diastology LVIDs:         4.00 cm   LV e' medial:    6.20 cm/s LV PW:         0.90 cm   LV E/e' medial:  24.5  LV IVS:        1.00 cm   LV e' lateral:   12.50 cm/s LVOT diam:     1.90 cm   LV E/e' lateral: 12.2 LV SV:         45 LV SV Index:   29 LVOT Area:     2.84 cm  LEFT ATRIUM             Index        RIGHT ATRIUM           Index LA diam:        4.10 cm 2.69 cm/m   RA Area:     14.30 cm LA Vol (A2C):   60.8 ml 39.91 ml/m  RA Volume:   37.80 ml  24.81 ml/m LA Vol (A4C):   47.8 ml 31.38 ml/m LA Biplane Vol: 56.6 ml 37.15 ml/m  AORTIC VALVE                    PULMONIC VALVE AV Area (Vmax):    2.44 cm     PV Vmax:       1.17 m/s AV Area (Vmean):   2.33 cm     PV Peak grad:  5.5 mmHg AV Area (VTI):     2.36 cm AV Vmax:           101.00 cm/s AV Vmean:          63.900 cm/s AV VTI:            0.190 m AV Peak Grad:      4.1 mmHg AV Mean Grad:      2.0 mmHg LVOT Vmax:         87.00 cm/s LVOT Vmean:        52.400 cm/s LVOT VTI:          0.158 m  LVOT/AV VTI ratio: 0.83  AORTA Ao Root diam: 2.70 cm MITRAL VALVE                  TRICUSPID VALVE MV Area (PHT): 4.24 cm       TR Peak grad:   30.7 mmHg MV Area VTI:   1.38 cm       TR Vmax:        277.00 cm/s MV Peak grad:  10.8 mmHg MV Mean grad:  4.0 mmHg       SHUNTS MV Vmax:       1.64 m/s       Systemic VTI:  0.16 m MV Vmean:      93.4 cm/s      Systemic Diam: 1.90 cm MV Decel Time: 179 msec MR Peak grad:    79.6 mmHg MR Mean grad:    50.0 mmHg MR Vmax:         446.00 cm/s MR Vmean:        328.0 cm/s MR PISA:         2.26 cm MR PISA Eff ROA: 47 mm MR PISA Radius:  0.60 cm MV E velocity: 152.00 cm/s MV A velocity: 134.00 cm/s MV E/A ratio:  1.13 Lorine Bears MD Electronically signed by Lorine Bears MD Signature Date/Time: 03/26/2023/6:17:49 PM    Final     Cardiac Studies   Echo 03/26/23:    1. Left ventricular ejection fraction, by estimation, is 30 to 35%. The left ventricle has moderately decreased function. Left ventricular endocardial border not optimally defined to evaluate regional wall motion. The  left ventricular internal cavity size was mildly dilated. Left ventricular diastolic parameters are consistent with Grade II diastolic dysfunction (pseudonormalization).  2. Right ventricular systolic function is normal. The right ventricular size is normal. There is mildly elevated pulmonary artery systolic pressure.  3. Left atrial size was mildly dilated.  4. The mitral valve is degenerative. Severe mitral valve regurgitation. No evidence of mitral stenosis.  5. The aortic valve is normal in structure. Aortic valve regurgitation is not visualized. Aortic valve sclerosis/calcification is present, without any evidence of aortic stenosis.  6. The inferior vena cava is normal in size with <50% respiratory variability, suggesting right atrial pressure of 8 mmHg.    Patient Profile     88 y.o. female with hypertension, hyperlipidemia, COPD, CAD, PAD, CKD, PAF, CVA, diabetes,  admitted with acute systolic diastolic heart failure.  Assessment & Plan    # Acute systolic and diastolic HF:  # Severe eccentric MR:  Ms. Gust has newly diagnosed systolic heart failure.  LVEF 30-35% this admission.  She also has severe mitral regurgitation.  Ultimately she needs left and right heart catheterization.  She also needs TEE to better evaluate her mitral valve.  Unfortunately, she has worsening renal function and is not a candidate for cardiac catheterization at this time.  Agree with goals of care conversation.  Recommend heart failure team evaluation next week.  Bisoprolol was started this admission.  Her home amlodipine was held.  If renal function does not improve with holding diuretics, we need may need to stop her beta-blocker and consider inotropes in order to diurese.  Strict I/O and daily weights.   # CAD:  # PAD:  # Hyperlipidemia: Continue rosuvastatin/  # PAF:  Currently in sinus rhythm.  Her home Eliquis is being held and she is on a heparin drip until we determine what procedures need to be performed.  # Acute on chronic renal failure.   Holding Lasix as above.     For questions or updates, please contact [facility] Please consult [email or web address] for contact info under        Signed, Chilton Si, MD  03/27/2023, 11:06 AM

## 2023-03-27 NOTE — ED Notes (Signed)
Pt has small loose BM. This RN assisted pt in turning and changing. Foley care provided. Labia noticably red, swollen and painful. MD notified.

## 2023-03-27 NOTE — Progress Notes (Signed)
ANTICOAGULATION CONSULT NOTE  Pharmacy Consult for heparin infusion Indication: ACS/STEMI/ apixaban for Afib PTA  Allergies  Allergen Reactions   Cefdinir Diarrhea   Saxagliptin Diarrhea   Epinephrine Other (See Comments)    Patient does not remember what happens when she uses this   Evolocumab     Pain with injection   Atorvastatin Other (See Comments)    Muscle aches   Codeine Other (See Comments)    Upset stomach   Ezetimibe Other (See Comments)    Myalgias(ZETIA)   Limonene Rash    Patient does not recall this reaction   Nitrofurantoin Rash and Other (See Comments)    Pruitus   Sulfa Antibiotics Rash and Other (See Comments)    Sore mouth     Patient Measurements: Height: 5' (152.4 cm) Weight: 56.2 kg (124 lb) IBW/kg (Calculated) : 45.5 Heparin Dosing Weight: 56.2 kg  Vital Signs: BP: 111/56 (06/01 0630) Pulse Rate: 77 (06/01 0630)  Labs: Recent Labs    03/25/23 1001 03/25/23 1209 03/26/23 0444 03/26/23 0700 03/26/23 0700 03/26/23 1428 03/26/23 1711 03/26/23 2228 03/27/23 0437 03/27/23 0802  HGB 10.9*  --  10.3*  --   --   --   --   --  9.5*  --   HCT 33.7*  --  31.7*  --   --   --   --   --  28.8*  --   PLT 268  --  264  --   --   --   --   --  235  --   APTT  --   --   --  36   < > 95*  --  120*  --  68*  LABPROT  --   --   --  17.5*  --   --   --   --   --   --   INR  --   --   --  1.4*  --   --   --   --   --   --   HEPARINUNFRC  --   --   --   --   --   --   --   --   --  >1.10*  CREATININE 1.43*   < > 2.03*  --   --  2.36*  --   --  2.59*  --   TROPONINIHS 25*   < > 1,186*  --   --  1,165* 1,263*  --   --   --    < > = values in this interval not displayed.     Estimated Creatinine Clearance: 12.3 mL/min (A) (by C-G formula based on SCr of 2.59 mg/dL (H)).   Medical History: Past Medical History:  Diagnosis Date   Acute CHF (congestive heart failure) (HCC) 02/18/2020   Acute diastolic CHF (congestive heart failure) (HCC)    Acute kidney  injury superimposed on CKD (HCC)    Allergies    Anxiety    Arthritis    spine and shoulder   Atherosclerosis of artery of extremity with rest pain (HCC) 10/12/2018   Cancer (HCC)    skin   CHF (congestive heart failure) (HCC)    Diabetes mellitus without complication (HCC)    Diverticulosis of large intestine with hemorrhage    GI bleeding 12/25/2019   Heart murmur    Hyperlipidemia    Hypertension    Macula lutea degeneration    Mitral and aortic valve disease  Myocardial infarction (HCC)    may have had a "light" heart attack   Non-ST elevation (NSTEMI) myocardial infarction (HCC)    Occasional tremors    PAD (peripheral artery disease) (HCC)    Rectal bleeding    Shingles    patient unaware but daughter confirms. it was a long time ago   Stroke [facility]) 01/2017   may have had a slight stroke   TIA (transient ischemic attack) 01/2017   UTI (urinary tract infection)    Vascular disease, peripheral (HCC)     Medications:  PTA Meds: Apixaban 2.5 mg BID, last dose 5/30 @ 1856  Assessment: Pt is a 88 yo female initially presenting to ED w/ COPD exacerbation, now c/o increase WOB , found with increasing Troponin I levels.  5/31 1428 aPTT=95 Therapeutic x1 5/31 2228 aPTT=120, supratherapeutic 6/1   0802 aPTT=68/HL>1.10   aPTT therapeutic   Goal of Therapy:  Heparin level 0.3-0.7 units/ml aPTT 66-102 seconds Monitor platelets by anticoagulation protocol: Yes   Plan:  6/1   0802 aPTT=68/HL>1.10   aPTT therapeutic Continue heparin infusion at 650 units/hr Will follow aPTT until correlation w/ HL confirmed. Not correlating yet. Will recheck aPTT in 6 hr a-(a little early since heparin drip came out and the timing was unknown) (was switched to other IV) HL & CBC daily while on heparin  Bari Mantis PharmD Clinical Pharmacist 03/27/2023

## 2023-03-27 NOTE — Progress Notes (Signed)
[facility]  ROUNDING NOTE   Subjective:   [name] was admitted to [facility] on 03/25/2023 for COPD exacerbation [facility]) [J44.1]  Patient with shortness of breath. Found to have pulmonary effusion.   Nephrology consulted for acute kidney injury on chronic kidney disease, hyponatremia and metabolic acidosis   Objective:  Vital signs in last 24 hours:  Temp:  [98.4 F (36.9 C)] 98.4 F (36.9 C) (05/31 2030) Pulse Rate:  [77-104] 86 (06/01 1330) Resp:  [16-27] 27 (06/01 1330) BP: (101-128)/(53-65) 101/54 (06/01 1330) SpO2:  [90 %-100 %] 98 % (06/01 1335) FiO2 (%):  [35 %] 35 % (06/01 1335)  Weight change:  Filed Weights   03/25/23 0931  Weight: 56.2 kg    Intake/Output: I/O last 3 completed shifts: In: -  Out: 350 [Urine:350]   Intake/Output this shift:  Total I/O In: -  Out: 400 [Urine:400]  Physical Exam: General: NAD,   Head: Normocephalic, atraumatic. Moist oral mucosal membranes  Eyes: Anicteric, PERRL  Neck: Supple, trachea midline  Lungs:  Clear  Heart: Regular rate and rhythm  Abdomen:  Soft, nontender,   Extremities:  no peripheral edema.  Neurologic: Nonfocal, moving all four extremities  Skin: No lesions        Basic Metabolic Panel: Recent Labs  Lab 03/25/23 1318 03/25/23 1857 03/26/23 0444 03/26/23 1428 03/27/23 0437  NA 125* 125* 125* 125* 126*  K 4.3 4.2 4.5 4.7 4.4  CL 96* 95* 96* 94* 97*  CO2 12* 13* 18* 16* 15*  GLUCOSE 364* 338* 264* 231* 151*  BUN 38* 44* 48* 55* 60*  CREATININE 1.70* 2.00* 2.03* 2.36* 2.59*  CALCIUM 8.2* 8.5* 8.8* 8.6* 8.4*  MG  --   --   --   --  2.1    Liver Function Tests: Recent Labs  Lab 03/25/23 1001  AST 22  ALT 16  ALKPHOS 73  BILITOT 0.6  PROT 6.2*  ALBUMIN 3.5   No results for input(s): "LIPASE", "AMYLASE" in the last 168 hours. No results for input(s): "AMMONIA" in the last 168 hours.  CBC: Recent Labs  Lab 03/25/23 1001 03/26/23 0444 03/27/23 0437  WBC 12.0* 20.7*  21.9*  HGB 10.9* 10.3* 9.5*  HCT 33.7* 31.7* 28.8*  MCV 88.5 87.6 88.3  PLT 268 264 235    Cardiac Enzymes: No results for input(s): "CKTOTAL", "CKMB", "CKMBINDEX", "TROPONINI" in the last 168 hours.  BNP: Invalid input(s): "POCBNP"  CBG: Recent Labs  Lab 03/26/23 1713 03/26/23 2016 03/27/23 0339 03/27/23 0801 03/27/23 1328  GLUCAP 227* 212* 152* 166* 202*    Microbiology: Results for orders placed or performed during the hospital encounter of 03/25/23  Blood Culture (routine x 2)     Status: None (Preliminary result)   Collection Time: 03/25/23 12:09 PM   Specimen: BLOOD  Result Value Ref Range Status   Specimen Description BLOOD LEFT [facility]  Final   Special Requests   Final    BOTTLES DRAWN AEROBIC AND ANAEROBIC Blood Culture adequate volume   Culture   Final    NO GROWTH 2 DAYS Performed at [facility], [address] [place], [place] [address]    Report Status PENDING  Incomplete  Blood Culture (routine x 2)     Status: None (Preliminary result)   Collection Time: 03/25/23 12:10 PM   Specimen: BLOOD  Result Value Ref Range Status   Specimen Description BLOOD LEFT FA  Final   Special Requests   Final  BOTTLES DRAWN AEROBIC AND ANAEROBIC Blood Culture adequate volume   Culture   Final    NO GROWTH 2 DAYS Performed at [facility], [address] [place], [place] [address]    Report Status PENDING  Incomplete  SARS Coronavirus 2 by RT PCR (hospital order, performed in [facility] hospital lab) *cepheid single result test* Anterior Nasal Swab     Status: None   Collection Time: 03/25/23  1:18 PM   Specimen: Anterior Nasal Swab  Result Value Ref Range Status   SARS Coronavirus 2 by RT PCR NEGATIVE NEGATIVE Final    Comment: (NOTE) SARS-CoV-2 target nucleic acids are NOT DETECTED.  The SARS-CoV-2 RNA is generally detectable in upper and lower respiratory specimens during the acute phase of infection. The lowest concentration of SARS-CoV-2  viral copies this assay can detect is 250 copies / mL. A negative result does not preclude SARS-CoV-2 infection and should not be used as the sole basis for treatment or other patient management decisions.  A negative result may occur with improper specimen collection / handling, submission of specimen other than nasopharyngeal swab, presence of viral mutation(s) within the areas targeted by this assay, and inadequate number of viral copies (<250 copies / mL). A negative result must be combined with clinical observations, patient history, and epidemiological information.  Fact Sheet for Patients:   RoadLapTop.co.za  Fact Sheet for Healthcare Providers: http://kim-miller.com/  This test is not yet approved or  cleared by the [place] FDA and has been authorized for detection and/or diagnosis of SARS-CoV-2 by FDA under an Emergency Use Authorization (EUA).  This EUA will remain in effect (meaning this test can be used) for the duration of the COVID-19 declaration under Section 564(b)(1) of the Act, 21 U.S.C. section 360bbb-3(b)(1), unless the authorization is terminated or revoked sooner.  Performed at [facility], [address] [place], [place] [address]   MRSA Next Gen by PCR, Nasal     Status: None   Collection Time: 03/27/23  4:40 AM   Specimen: Nasal Mucosa; Nasal Swab  Result Value Ref Range Status   MRSA by PCR Next Gen NOT DETECTED NOT DETECTED Final    Comment: (NOTE) The GeneXpert MRSA Assay (FDA approved for NASAL specimens only), is one component of a comprehensive MRSA colonization surveillance program. It is not intended to diagnose MRSA infection nor to guide or monitor treatment for MRSA infections. Test performance is not FDA approved in patients less than 81 years old. Performed at [facility], [address] [place], [place] [address]     Coagulation Studies: Recent Labs     03/26/23 0700  LABPROT 17.5*  INR 1.4*    Urinalysis: No results for input(s): "COLORURINE", "LABSPEC", "PHURINE", "GLUCOSEU", "HGBUR", "BILIRUBINUR", "KETONESUR", "PROTEINUR", "UROBILINOGEN", "NITRITE", "LEUKOCYTESUR" in the last 72 hours.  Invalid input(s): "APPERANCEUR"    Imaging: ECHOCARDIOGRAM COMPLETE  Result Date: 03/26/2023    ECHOCARDIOGRAM REPORT   Patient Name:   Jill Hudson Date of Exam: 03/26/2023 Medical Rec #:  [id number]     Height:       60.0 in Accession #:    [id number]    Weight:       124.0 lb Date of Birth:  July 02, 1935      BSA:          1.523 m Patient Age:    88 years      BP:           122/52 mmHg Patient  Gender: F             HR:           108 bpm. Exam Location:  [facility] Procedure: 2D Echo, Cardiac Doppler and Color Doppler Indications:     CHF  History:         Patient has prior history of Echocardiogram examinations, most                  recent 02/19/2020. CHF, CAD, PAD, COPD and Stroke,                  Arrythmias:Atrial Fibrillation; Risk Factors:Hypertension,                  Diabetes and Dyslipidemia.  Sonographer:     Mikki Harbor Referring Phys:  [id number] Brien Few NIU Diagnosing Phys: Lorine Bears MD IMPRESSIONS  1. Left ventricular ejection fraction, by estimation, is 30 to 35%. The left ventricle has moderately decreased function. Left ventricular endocardial border not optimally defined to evaluate regional wall motion. The left ventricular internal cavity size was mildly dilated. Left ventricular diastolic parameters are consistent with Grade II diastolic dysfunction (pseudonormalization).  2. Right ventricular systolic function is normal. The right ventricular size is normal. There is mildly elevated pulmonary artery systolic pressure.  3. Left atrial size was mildly dilated.  4. The mitral valve is degenerative. Severe mitral valve regurgitation. No evidence of mitral stenosis.  5. The aortic valve is normal in structure. Aortic valve regurgitation is not  visualized. Aortic valve sclerosis/calcification is present, without any evidence of aortic stenosis.  6. The inferior vena cava is normal in size with <50% respiratory variability, suggesting right atrial pressure of 8 mmHg. FINDINGS  Left Ventricle: Left ventricular ejection fraction, by estimation, is 30 to 35%. The left ventricle has moderately decreased function. Left ventricular endocardial border not optimally defined to evaluate regional wall motion. The left ventricular internal cavity size was mildly dilated. There is no left ventricular hypertrophy. Left ventricular diastolic parameters are consistent with Grade II diastolic dysfunction (pseudonormalization). Right Ventricle: The right ventricular size is normal. No increase in right ventricular wall thickness. Right ventricular systolic function is normal. There is mildly elevated pulmonary artery systolic pressure. The tricuspid regurgitant velocity is 2.77  m/s, and with an assumed right atrial pressure of 8 mmHg, the estimated right ventricular systolic pressure is 38.7 mmHg. Left Atrium: Left atrial size was mildly dilated. Right Atrium: Right atrial size was normal in size. Pericardium: There is no evidence of pericardial effusion. Mitral Valve: The mitral valve is degenerative in appearance. There is mild thickening of the mitral valve leaflet(s). There is mild calcification of the mitral valve leaflet(s). Severe mitral valve regurgitation, with posteriorly-directed jet. No evidence of mitral valve stenosis. MV peak gradient, 10.8 mmHg. The mean mitral valve gradient is 4.0 mmHg. Tricuspid Valve: The tricuspid valve is normal in structure. Tricuspid valve regurgitation is mild . No evidence of tricuspid stenosis. Aortic Valve: The aortic valve is normal in structure. Aortic valve regurgitation is not visualized. Aortic valve sclerosis/calcification is present, without any evidence of aortic stenosis. Aortic valve mean gradient measures 2.0 mmHg.  Aortic valve peak  gradient measures 4.1 mmHg. Aortic valve area, by VTI measures 2.36 cm. Pulmonic Valve: The pulmonic valve was normal in structure. Pulmonic valve regurgitation is not visualized. No evidence of pulmonic stenosis. Aorta: The aortic root is normal in size and structure. Venous: The inferior vena cava is normal in size with less  than 50% respiratory variability, suggesting right atrial pressure of 8 mmHg. IAS/Shunts: No atrial level shunt detected by color flow Doppler.  LEFT VENTRICLE PLAX 2D LVIDd:         5.20 cm   Diastology LVIDs:         4.00 cm   LV e' medial:    6.20 cm/s LV PW:         0.90 cm   LV E/e' medial:  24.5 LV IVS:        1.00 cm   LV e' lateral:   12.50 cm/s LVOT diam:     1.90 cm   LV E/e' lateral: 12.2 LV SV:         45 LV SV Index:   29 LVOT Area:     2.84 cm  LEFT ATRIUM             Index        RIGHT ATRIUM           Index LA diam:        4.10 cm 2.69 cm/m   RA Area:     14.30 cm LA Vol (A2C):   60.8 ml 39.91 ml/m  RA Volume:   37.80 ml  24.81 ml/m LA Vol (A4C):   47.8 ml 31.38 ml/m LA Biplane Vol: 56.6 ml 37.15 ml/m  AORTIC VALVE                    PULMONIC VALVE AV Area (Vmax):    2.44 cm     PV Vmax:       1.17 m/s AV Area (Vmean):   2.33 cm     PV Peak grad:  5.5 mmHg AV Area (VTI):     2.36 cm AV Vmax:           101.00 cm/s AV Vmean:          63.900 cm/s AV VTI:            0.190 m AV Peak Grad:      4.1 mmHg AV Mean Grad:      2.0 mmHg LVOT Vmax:         87.00 cm/s LVOT Vmean:        52.400 cm/s LVOT VTI:          0.158 m LVOT/AV VTI ratio: 0.83  AORTA Ao Root diam: 2.70 cm MITRAL VALVE                  TRICUSPID VALVE MV Area (PHT): 4.24 cm       TR Peak grad:   30.7 mmHg MV Area VTI:   1.38 cm       TR Vmax:        277.00 cm/s MV Peak grad:  10.8 mmHg MV Mean grad:  4.0 mmHg       SHUNTS MV Vmax:       1.64 m/s       Systemic VTI:  0.16 m MV Vmean:      93.4 cm/s      Systemic Diam: 1.90 cm MV Decel Time: 179 msec MR Peak grad:    79.6 mmHg MR Mean grad:     50.0 mmHg MR Vmax:         446.00 cm/s MR Vmean:        328.0 cm/s MR PISA:         2.26 cm MR PISA Eff ROA: 47 mm MR PISA  Radius:  0.60 cm MV E velocity: 152.00 cm/s MV A velocity: 134.00 cm/s MV E/A ratio:  1.13 Lorine Bears MD Electronically signed by Lorine Bears MD Signature Date/Time: 03/26/2023/6:17:49 PM    Final      Medications:    azithromycin Stopped (03/27/23 1401)   cefTRIAXone (ROCEPHIN)  IV Stopped (03/27/23 1251)   heparin 650 Units/hr (03/27/23 0745)   sodium bicarbonate 150 mEq in dextrose 5 % 1,150 mL infusion      arformoterol  15 mcg Nebulization BID   bisoprolol  5 mg Oral Daily   cyanocobalamin  1,000 mcg Oral Daily   promethazine  6.25 mg Oral QHS   And   dextromethorphan  15 mg Oral QHS   feeding supplement (GLUCERNA SHAKE)  237 mL Oral TID BM   ferrous sulfate  325 mg Oral Q breakfast   gabapentin  100 mg Oral QHS   insulin aspart  0-9 Units Subcutaneous Q6H   insulin glargine-yfgn  4 Units Subcutaneous Daily   methylPREDNISolone (SOLU-MEDROL) injection  40 mg Intravenous Daily   multivitamin  1 tablet Oral BID   revefenacin  175 mcg Nebulization Daily   rosuvastatin  10 mg Oral Once per day on Mon Wed Fri Sat   sodium chloride  1 g Oral BID WC   acetaminophen, dextromethorphan-guaiFENesin, diphenhydrAMINE, hydrALAZINE, levalbuterol, melatonin, metoprolol tartrate, ondansetron (ZOFRAN) IV, oxyCODONE, senna-docusate, traZODone  Assessment/ Plan:  [name] is a 88 y.o.  female with diabetes mellitus type II, hypertension, coronary artery disease, peripheral arterial disease, tremor, CVA, congestive heart failure and anemia who is admitted to [facility] on 03/25/2023 for COPD exacerbation (HCC) [J44.1]  Acute kidney injury with acute metabolic acidosis on chronic kidney disease stage IIIB: baseline creatinine of 1.48, GFR of 34 on 12/09/2022. Suspect patient is actually volume depleted - start gentle IV fluids for one day.   Hypertension with chronic  kidney disease: with chronic systolic and diastolic congestive heart failure. Holding diuretics.   Hyponatremia: monitor volume status.    LOS: 2 Allannah Kempen 6/1/20242:42 PM

## 2023-03-27 NOTE — ED Notes (Signed)
This RN heard pt calling out for "help". Pt's IV with heparin pulled out and Wells off pt. Pt SOB and anxious. This RN reapplied Isabel and increased to 5L. Administered breathing treatments. Restarted Heparin gtt in other IV. Assisted pt to change gowns and blankets. Pt now sitting up in bed eating breakfast in no visible discomfort of SOB, RT decreased pt to 4L [place] and at 92%.

## 2023-03-27 NOTE — ED Notes (Signed)
Pt had large loose BM. This RN and second RN changed bed linen and pt's gown. Foley care performed.

## 2023-03-27 NOTE — Progress Notes (Signed)
ANTICOAGULATION CONSULT NOTE  Pharmacy Consult for heparin infusion Indication: ACS/STEMI/ apixaban for Afib PTA  Allergies  Allergen Reactions   Cefdinir Diarrhea   Saxagliptin Diarrhea   Epinephrine Other (See Comments)    Patient does not remember what happens when she uses this   Evolocumab     Pain with injection   Atorvastatin Other (See Comments)    Muscle aches   Codeine Other (See Comments)    Upset stomach   Ezetimibe Other (See Comments)    Myalgias(ZETIA)   Limonene Rash    Patient does not recall this reaction   Nitrofurantoin Rash and Other (See Comments)    Pruitus   Sulfa Antibiotics Rash and Other (See Comments)    Sore mouth     Patient Measurements: Height: 5' (152.4 cm) Weight: 56.2 kg (124 lb) IBW/kg (Calculated) : 45.5 Heparin Dosing Weight: 56.2 kg  Vital Signs: BP: 101/54 (06/01 1330) Pulse Rate: 86 (06/01 1330)  Labs: Recent Labs    03/25/23 1001 03/25/23 1209 03/26/23 0444 03/26/23 0700 03/26/23 0700 03/26/23 1428 03/26/23 1711 03/26/23 2228 03/27/23 0437 03/27/23 0802 03/27/23 1358  HGB 10.9*  --  10.3*  --   --   --   --   --  9.5*  --   --   HCT 33.7*  --  31.7*  --   --   --   --   --  28.8*  --   --   PLT 268  --  264  --   --   --   --   --  235  --   --   APTT  --   --   --  36   < > 95*  --  120*  --  68* 89*  LABPROT  --   --   --  17.5*  --   --   --   --   --   --   --   INR  --   --   --  1.4*  --   --   --   --   --   --   --   HEPARINUNFRC  --   --   --   --   --   --   --   --   --  >1.10*  --   CREATININE 1.43*   < > 2.03*  --   --  2.36*  --   --  2.59*  --   --   TROPONINIHS 25*   < > 1,186*  --   --  1,165* 1,263*  --   --   --   --    < > = values in this interval not displayed.     Estimated Creatinine Clearance: 12.3 mL/min (A) (by C-G formula based on SCr of 2.59 mg/dL (H)).   Medical History: Past Medical History:  Diagnosis Date   Acute CHF (congestive heart failure) (HCC) 02/18/2020   Acute  diastolic CHF (congestive heart failure) (HCC)    Acute kidney injury superimposed on CKD (HCC)    Allergies    Anxiety    Arthritis    spine and shoulder   Atherosclerosis of artery of extremity with rest pain (HCC) 10/12/2018   Cancer (HCC)    skin   CHF (congestive heart failure) (HCC)    Diabetes mellitus without complication (HCC)    Diverticulosis of large intestine with hemorrhage    GI bleeding 12/25/2019  Heart murmur    Hyperlipidemia    Hypertension    Macula lutea degeneration    Mitral and aortic valve disease    Myocardial infarction (HCC)    may have had a "light" heart attack   Non-ST elevation (NSTEMI) myocardial infarction (HCC)    Occasional tremors    PAD (peripheral artery disease) (HCC)    Rectal bleeding    Shingles    patient unaware but daughter confirms. it was a long time ago   Stroke [facility]) 01/2017   may have had a slight stroke   TIA (transient ischemic attack) 01/2017   UTI (urinary tract infection)    Vascular disease, peripheral (HCC)     Medications:  PTA Meds: Apixaban 2.5 mg BID, last dose 5/30 @ 1856  Assessment: Pt is a 88 yo female initially presenting to ED w/ COPD exacerbation, now c/o increase WOB , found with increasing Troponin I levels.  5/31 1428 aPTT=95 Therapeutic x1 5/31 2228 aPTT=120, supratherapeutic 6/1   0802 aPTT=68/HL>1.10   aPTT therapeuticx1 6/1   1358 aPTT= 89  Therapeuticx2   Goal of Therapy:  Heparin level 0.3-0.7 units/ml aPTT 66-102 seconds Monitor platelets by anticoagulation protocol: Yes   Plan:  6/1   1358 aPTT= 89  Therapeuticx2 Continue heparin infusion at 650 units/hr Will follow aPTT until correlation w/ HL confirmed. Not correlating yet. Will check aPTT/HL with am labs HL & CBC daily while on heparin  Bari Mantis PharmD Clinical Pharmacist 03/27/2023

## 2023-03-27 NOTE — ED Notes (Signed)
Pt had loose BM. This RN assisted pt to turn, foley cleansed

## 2023-03-27 NOTE — Progress Notes (Signed)
PROGRESS NOTE    Jill Hudson  ZOX:[id number] DOB: Aug 02, 1935 DOA: 03/25/2023 PCP: Reubin Milan, MD   Brief Narrative:  This 88 year old female with history of COPD, HTN, HLD, DM2, stroke, TIA, GERD, anxiety, PVD, CAD, MI, former tobacco use, CKD stage III, GI bleed, iron deficiency anemia, PAD status post PCI comes to the hospital with shortness of breath and hypoxia. Patient was found to have bilateral pulmonary edema /effusion with elevated BNP.  Patient is admitted for further evaluation.  Cardiology is consulted.  Patient will eventually need right and left heart cath pending improvement in renal functions.  Assessment & Plan:   Principal Problem:   COPD exacerbation (HCC) Active Problems:   Severe sepsis (HCC)   Acute on chronic diastolic CHF (congestive heart failure) (HCC)   Essential hypertension   History of CVA (cerebrovascular accident)   CAD (coronary artery disease)   Myocardial injury   Atrial fibrillation with RVR (HCC)   Type II diabetes mellitus with renal manifestations (HCC)   Chronic kidney disease, stage 3a (HCC)   Hyponatremia   HLD (hyperlipidemia)   Iron deficiency anemia   PAD (peripheral artery disease) (HCC)   AKI (acute kidney injury) (HCC)  Acute systolic CHF: Acute on chronic diastolic CHF:  Class III Previous echocardiogram in March 2023 showed LVEF of 55%.   Now has clinical evidence of bilateral pulmonary edema / effusion and elevated BNP.   Continue diuresis as per cardiology. Repeat echo shows LVEF 30 to 35%. Cardiology consulted.  Patient will need left and right heart catheterization.  She will also need TEE to better evaluate her mitral valve.  She is not a candidate for catheterization at this time due to worsening renal functions. Recommend heart failure team evaluation next week.   NSTEMI: History of CAD Troponin have significantly trended up. Continue Heparin drip    LDL 41, A1c 7.9.  Continue to trend troponin. Not a candidate  for catheterization at this time due to worsening renal functions.   Hyponatremia: Suspect from volume overload.   Continue to monitor   Acute kidney injury on CKD stage III A: Baseline creatinine 1.3.  Today is 2.59.  Closely monitor.   Bladder scan shows 350cc, foley placed for accurate I/Os Dr Cherylann Ratel from [facility] consulted, awaiting recommendation.   Lactic acidosis: Lactic acid is improving.  Continue to monitor   COPD exacerbation: Suspect patient's most of wheezing is cardiac in nature.  Low suspicion for PE as patient is already on Eliquis home. Admitting provider has started patient on Solu-Medrol which we will continue along with bronchodilators, I-S and flutter valve.  Low threshold to transition to soft.  Currently on empiric Rocephin and azithromycin, check procalcitonin if negative it can be discontinued   Essential hypertension: Continue amlodipine.  IV as needed.   History of CVA (cerebrovascular accident) On heparin drip and statin.   Atrial fibrillation with RVR (HCC) History of paroxysmal atrial fibrillation Continue metoprolol and heparin drip.   Type II diabetes mellitus with renal manifestations [facility]):  Recent A1c 7.9.  Semglee 4 units daily, sliding scale   HLD (hyperlipidemia) Continue Crestor   Iron deficiency anemia: Hemoglobin 10.9 (11.2 on 03/24/2022) iron supplement   PAD (peripheral artery disease) (HCC): S/p of a stent placement to the left leg -Patient is on Crestor and Eliquis at home     DVT prophylaxis: Heparin drip Code Status: Full code Family Communication: No family at bed side Disposition Plan:     Status is: Inpatient  Remains inpatient appropriate because: Acutely ill , continue hospital stay due to multiple medical issues.   Consultants:  Cardiology  Procedures: None  Antimicrobials:  Anti-infectives (From admission, onward)    Start     Dose/Rate Route Frequency Ordered Stop   03/26/23 1600  vancomycin (VANCOREADY)  IVPB 1250 mg/250 mL        1,250 mg 166.7 mL/hr over 90 Minutes Intravenous  Once 03/26/23 1452 03/26/23 1838   03/26/23 1452  vancomycin variable dose per unstable renal function (pharmacist dosing)         Does not apply See admin instructions 03/26/23 1452     03/25/23 1145  cefTRIAXone (ROCEPHIN) 2 g in sodium chloride 0.9 % 100 mL IVPB        2 g 200 mL/hr over 30 Minutes Intravenous Every 24 hours 03/25/23 1135 03/30/23 1144   03/25/23 1145  azithromycin (ZITHROMAX) 500 mg in sodium chloride 0.9 % 250 mL IVPB        500 mg 250 mL/hr over 60 Minutes Intravenous Every 24 hours 03/25/23 1135 03/30/23 1144      Subjective: Patient seen and examined at bedside.  Overnight events noted. Patient report doing much better.  She still reports having shortness of breath remains on 4 L of supplemental oxygen.  Objective: Vitals:   03/27/23 0530 03/27/23 0600 03/27/23 0630 03/27/23 0817  BP: (!) 110/56 (!) 110/59 (!) 111/56   Pulse: 78 79 77   Resp: 18 16 18    Temp:      TempSrc:      SpO2: 94% 94% 92% 93%  Weight:      Height:        Intake/Output Summary (Last 24 hours) at 03/27/2023 1309 Last data filed at 03/27/2023 1256 Gross per 24 hour  Intake --  Output 750 ml  Net -750 ml   Filed Weights   03/25/23 0931  Weight: 56.2 kg    Examination:  General exam: Appears calm and comfortable, deconditioned, not in any acute distress. Respiratory system: Clear to auscultation. Respiratory effort normal.  RR 15 Cardiovascular system: S1 & S2 heard, RRR. No JVD, murmurs, rubs, gallops or clicks Gastrointestinal system: Abdomen is soft, non tender, non distended, bowel sounds+ Central nervous system: Alert and oriented x 3. No focal neurological deficits. Extremities: Edema+, No cyanosis, No clubbing Skin: No rashes, lesions or ulcers Psychiatry: Judgement and insight appear normal. Mood & affect appropriate.     Data Reviewed: I have personally reviewed following labs and  imaging studies  CBC: Recent Labs  Lab 03/25/23 1001 03/26/23 0444 03/27/23 0437  WBC 12.0* 20.7* 21.9*  HGB 10.9* 10.3* 9.5*  HCT 33.7* 31.7* 28.8*  MCV 88.5 87.6 88.3  PLT 268 264 235   Basic Metabolic Panel: Recent Labs  Lab 03/25/23 1318 03/25/23 1857 03/26/23 0444 03/26/23 1428 03/27/23 0437  NA 125* 125* 125* 125* 126*  K 4.3 4.2 4.5 4.7 4.4  CL 96* 95* 96* 94* 97*  CO2 12* 13* 18* 16* 15*  GLUCOSE 364* 338* 264* 231* 151*  BUN 38* 44* 48* 55* 60*  CREATININE 1.70* 2.00* 2.03* 2.36* 2.59*  CALCIUM 8.2* 8.5* 8.8* 8.6* 8.4*  MG  --   --   --   --  2.1   GFR: Estimated Creatinine Clearance: 12.3 mL/min (A) (by C-G formula based on SCr of 2.59 mg/dL (H)). Liver Function Tests: Recent Labs  Lab 03/25/23 1001  AST 22  ALT 16  ALKPHOS 73  BILITOT 0.6  PROT 6.2*  ALBUMIN 3.5   No results for input(s): "LIPASE", "AMYLASE" in the last 168 hours. No results for input(s): "AMMONIA" in the last 168 hours. Coagulation Profile: Recent Labs  Lab 03/26/23 0700  INR 1.4*   Cardiac Enzymes: No results for input(s): "CKTOTAL", "CKMB", "CKMBINDEX", "TROPONINI" in the last 168 hours. BNP (last 3 results) No results for input(s): "PROBNP" in the last 8760 hours. HbA1C: Recent Labs    03/25/23 1318  HGBA1C 7.9*   CBG: Recent Labs  Lab 03/26/23 1430 03/26/23 1713 03/26/23 2016 03/27/23 0339 03/27/23 0801  GLUCAP 248* 227* 212* 152* 166*   Lipid Profile: Recent Labs    03/26/23 0444  CHOL 117  HDL 65  LDLCALC 41  TRIG 53  CHOLHDL 1.8   Thyroid Function Tests: No results for input(s): "TSH", "T4TOTAL", "FREET4", "T3FREE", "THYROIDAB" in the last 72 hours. Anemia Panel: No results for input(s): "VITAMINB12", "FOLATE", "FERRITIN", "TIBC", "IRON", "RETICCTPCT" in the last 72 hours. Sepsis Labs: Recent Labs  Lab 03/25/23 1209 03/25/23 1318 03/25/23 1548 03/25/23 1748 03/26/23 0444 03/26/23 0850 03/26/23 1110  PROCALCITON 0.16  --   --   --  2.84   --   --   LATICACIDVEN 2.9*   < > 5.5* 6.7*  --  2.8* 2.0*   < > = values in this interval not displayed.    Recent Results (from the past 240 hour(s))  Blood Culture (routine x 2)     Status: None (Preliminary result)   Collection Time: 03/25/23 12:09 PM   Specimen: BLOOD  Result Value Ref Range Status   Specimen Description BLOOD LEFT [facility]  Final   Special Requests   Final    BOTTLES DRAWN AEROBIC AND ANAEROBIC Blood Culture adequate volume   Culture   Final    NO GROWTH 2 DAYS Performed at [facility], [address], [place], [place] [address]    Report Status PENDING  Incomplete  Blood Culture (routine x 2)     Status: None (Preliminary result)   Collection Time: 03/25/23 12:10 PM   Specimen: BLOOD  Result Value Ref Range Status   Specimen Description BLOOD LEFT FA  Final   Special Requests   Final    BOTTLES DRAWN AEROBIC AND ANAEROBIC Blood Culture adequate volume   Culture   Final    NO GROWTH 2 DAYS Performed at [facility], [address] [place], [place] [address]    Report Status PENDING  Incomplete  SARS Coronavirus 2 by RT PCR (hospital order, performed in [facility] hospital lab) *cepheid single result test* Anterior Nasal Swab     Status: None   Collection Time: 03/25/23  1:18 PM   Specimen: Anterior Nasal Swab  Result Value Ref Range Status   SARS Coronavirus 2 by RT PCR NEGATIVE NEGATIVE Final    Comment: (NOTE) SARS-CoV-2 target nucleic acids are NOT DETECTED.  The SARS-CoV-2 RNA is generally detectable in upper and lower respiratory specimens during the acute phase of infection. The lowest concentration of SARS-CoV-2 viral copies this assay can detect is 250 copies / mL. A negative result does not preclude SARS-CoV-2 infection and should not be used as the sole basis for treatment or other patient management decisions.  A negative result may occur with improper specimen collection / handling, submission of specimen other than  nasopharyngeal swab, presence of viral mutation(s) within the areas targeted by this assay, and inadequate number of viral copies (<250 copies / mL). A negative result  must be combined with clinical observations, patient history, and epidemiological information.  Fact Sheet for Patients:   RoadLapTop.co.za  Fact Sheet for Healthcare Providers: http://kim-miller.com/  This test is not yet approved or  cleared by the [place] FDA and has been authorized for detection and/or diagnosis of SARS-CoV-2 by FDA under an Emergency Use Authorization (EUA).  This EUA will remain in effect (meaning this test can be used) for the duration of the COVID-19 declaration under Section 564(b)(1) of the Act, 21 U.S.C. section 360bbb-3(b)(1), unless the authorization is terminated or revoked sooner.  Performed at [facility], [address] [place], [place] [address]   MRSA Next Gen by PCR, Nasal     Status: None   Collection Time: 03/27/23  4:40 AM   Specimen: Nasal Mucosa; Nasal Swab  Result Value Ref Range Status   MRSA by PCR Next Gen NOT DETECTED NOT DETECTED Final    Comment: (NOTE) The GeneXpert MRSA Assay (FDA approved for NASAL specimens only), is one component of a comprehensive MRSA colonization surveillance program. It is not intended to diagnose MRSA infection nor to guide or monitor treatment for MRSA infections. Test performance is not FDA approved in patients less than 46 years old. Performed at [facility], [address] [place], [place] [address]          Radiology Studies: ECHOCARDIOGRAM COMPLETE  Result Date: 03/26/2023    ECHOCARDIOGRAM REPORT   Patient Name:   SHAE SHEVCHENKO Date of Exam: 03/26/2023 Medical Rec #:  [id number]     Height:       60.0 in Accession #:    [id number]    Weight:       124.0 lb Date of Birth:  [birth year]      BSA:          1.523 m Patient Age:    88 years      BP:            122/52 mmHg Patient Gender: F             HR:           108 bpm. Exam Location:  [facility] Procedure: 2D Echo, Cardiac Doppler and Color Doppler Indications:     CHF  History:         Patient has prior history of Echocardiogram examinations, most                  recent 02/19/2020. CHF, CAD, PAD, COPD and Stroke,                  Arrythmias:Atrial Fibrillation; Risk Factors:Hypertension,                  Diabetes and Dyslipidemia.  Sonographer:     Mikki Harbor Referring Phys:  [id number] Brien Few NIU Diagnosing Phys: Lorine Bears MD IMPRESSIONS  1. Left ventricular ejection fraction, by estimation, is 30 to 35%. The left ventricle has moderately decreased function. Left ventricular endocardial border not optimally defined to evaluate regional wall motion. The left ventricular internal cavity size was mildly dilated. Left ventricular diastolic parameters are consistent with Grade II diastolic dysfunction (pseudonormalization).  2. Right ventricular systolic function is normal. The right ventricular size is normal. There is mildly elevated pulmonary artery systolic pressure.  3. Left atrial size was mildly dilated.  4. The mitral valve is degenerative. Severe mitral valve regurgitation. No evidence of mitral stenosis.  5. The aortic valve is normal in structure.  Aortic valve regurgitation is not visualized. Aortic valve sclerosis/calcification is present, without any evidence of aortic stenosis.  6. The inferior vena cava is normal in size with <50% respiratory variability, suggesting right atrial pressure of 8 mmHg. FINDINGS  Left Ventricle: Left ventricular ejection fraction, by estimation, is 30 to 35%. The left ventricle has moderately decreased function. Left ventricular endocardial border not optimally defined to evaluate regional wall motion. The left ventricular internal cavity size was mildly dilated. There is no left ventricular hypertrophy. Left ventricular diastolic parameters are consistent with Grade II  diastolic dysfunction (pseudonormalization). Right Ventricle: The right ventricular size is normal. No increase in right ventricular wall thickness. Right ventricular systolic function is normal. There is mildly elevated pulmonary artery systolic pressure. The tricuspid regurgitant velocity is 2.77  m/s, and with an assumed right atrial pressure of 8 mmHg, the estimated right ventricular systolic pressure is 38.7 mmHg. Left Atrium: Left atrial size was mildly dilated. Right Atrium: Right atrial size was normal in size. Pericardium: There is no evidence of pericardial effusion. Mitral Valve: The mitral valve is degenerative in appearance. There is mild thickening of the mitral valve leaflet(s). There is mild calcification of the mitral valve leaflet(s). Severe mitral valve regurgitation, with posteriorly-directed jet. No evidence of mitral valve stenosis. MV peak gradient, 10.8 mmHg. The mean mitral valve gradient is 4.0 mmHg. Tricuspid Valve: The tricuspid valve is normal in structure. Tricuspid valve regurgitation is mild . No evidence of tricuspid stenosis. Aortic Valve: The aortic valve is normal in structure. Aortic valve regurgitation is not visualized. Aortic valve sclerosis/calcification is present, without any evidence of aortic stenosis. Aortic valve mean gradient measures 2.0 mmHg. Aortic valve peak  gradient measures 4.1 mmHg. Aortic valve area, by VTI measures 2.36 cm. Pulmonic Valve: The pulmonic valve was normal in structure. Pulmonic valve regurgitation is not visualized. No evidence of pulmonic stenosis. Aorta: The aortic root is normal in size and structure. Venous: The inferior vena cava is normal in size with less than 50% respiratory variability, suggesting right atrial pressure of 8 mmHg. IAS/Shunts: No atrial level shunt detected by color flow Doppler.  LEFT VENTRICLE PLAX 2D LVIDd:         5.20 cm   Diastology LVIDs:         4.00 cm   LV e' medial:    6.20 cm/s LV PW:         0.90 cm   LV  E/e' medial:  24.5 LV IVS:        1.00 cm   LV e' lateral:   12.50 cm/s LVOT diam:     1.90 cm   LV E/e' lateral: 12.2 LV SV:         45 LV SV Index:   29 LVOT Area:     2.84 cm  LEFT ATRIUM             Index        RIGHT ATRIUM           Index LA diam:        4.10 cm 2.69 cm/m   RA Area:     14.30 cm LA Vol (A2C):   60.8 ml 39.91 ml/m  RA Volume:   37.80 ml  24.81 ml/m LA Vol (A4C):   47.8 ml 31.38 ml/m LA Biplane Vol: 56.6 ml 37.15 ml/m  AORTIC VALVE                    PULMONIC  VALVE AV Area (Vmax):    2.44 cm     PV Vmax:       1.17 m/s AV Area (Vmean):   2.33 cm     PV Peak grad:  5.5 mmHg AV Area (VTI):     2.36 cm AV Vmax:           101.00 cm/s AV Vmean:          63.900 cm/s AV VTI:            0.190 m AV Peak Grad:      4.1 mmHg AV Mean Grad:      2.0 mmHg LVOT Vmax:         87.00 cm/s LVOT Vmean:        52.400 cm/s LVOT VTI:          0.158 m LVOT/AV VTI ratio: 0.83  AORTA Ao Root diam: 2.70 cm MITRAL VALVE                  TRICUSPID VALVE MV Area (PHT): 4.24 cm       TR Peak grad:   30.7 mmHg MV Area VTI:   1.38 cm       TR Vmax:        277.00 cm/s MV Peak grad:  10.8 mmHg MV Mean grad:  4.0 mmHg       SHUNTS MV Vmax:       1.64 m/s       Systemic VTI:  0.16 m MV Vmean:      93.4 cm/s      Systemic Diam: 1.90 cm MV Decel Time: 179 msec MR Peak grad:    79.6 mmHg MR Mean grad:    50.0 mmHg MR Vmax:         446.00 cm/s MR Vmean:        328.0 cm/s MR PISA:         2.26 cm MR PISA Eff ROA: 47 mm MR PISA Radius:  0.60 cm MV E velocity: 152.00 cm/s MV A velocity: 134.00 cm/s MV E/A ratio:  1.13 Lorine Bears MD Electronically signed by Lorine Bears MD Signature Date/Time: 03/26/2023/6:17:49 PM    Final     Scheduled Meds:  arformoterol  15 mcg Nebulization BID   bisoprolol  5 mg Oral Daily   cyanocobalamin  1,000 mcg Oral Daily   promethazine  6.25 mg Oral QHS   And   dextromethorphan  15 mg Oral QHS   feeding supplement (GLUCERNA SHAKE)  237 mL Oral TID BM   ferrous sulfate  325 mg Oral Q  breakfast   gabapentin  100 mg Oral QHS   insulin aspart  0-9 Units Subcutaneous Q6H   insulin glargine-yfgn  4 Units Subcutaneous Daily   methylPREDNISolone (SOLU-MEDROL) injection  40 mg Intravenous Daily   multivitamin  1 tablet Oral BID   revefenacin  175 mcg Nebulization Daily   rosuvastatin  10 mg Oral Once per day on Mon Wed Fri Sat   sodium chloride  1 g Oral BID WC   vancomycin variable dose per unstable renal function (pharmacist dosing)   Does not apply See admin instructions   Continuous Infusions:  azithromycin 500 mg (03/27/23 1252)   cefTRIAXone (ROCEPHIN)  IV Stopped (03/27/23 1251)   heparin Stopped (03/27/23 0740)   sodium bicarbonate 150 mEq in dextrose 5 % 1,150 mL infusion       LOS: 2 days    Time spent: 50 mins  Willeen Niece, MD [facility]   If 7PM-7AM, please contact night-coverage

## 2023-03-27 NOTE — Progress Notes (Signed)
Patient resting well on nasal cannula. No distress noted. Bipap standby. Will continue o2 via nasal cannula. RN to contact if patient needs bipap.

## 2023-03-28 ENCOUNTER — Inpatient Hospital Stay: Payer: Medicare HMO

## 2023-03-28 DIAGNOSIS — N179 Acute kidney failure, unspecified: Secondary | ICD-10-CM | POA: Diagnosis not present

## 2023-03-28 DIAGNOSIS — J441 Chronic obstructive pulmonary disease with (acute) exacerbation: Secondary | ICD-10-CM | POA: Diagnosis not present

## 2023-03-28 DIAGNOSIS — E871 Hypo-osmolality and hyponatremia: Secondary | ICD-10-CM | POA: Diagnosis not present

## 2023-03-28 DIAGNOSIS — J439 Emphysema, unspecified: Secondary | ICD-10-CM | POA: Diagnosis not present

## 2023-03-28 DIAGNOSIS — J90 Pleural effusion, not elsewhere classified: Secondary | ICD-10-CM | POA: Diagnosis not present

## 2023-03-28 DIAGNOSIS — E8721 Acute metabolic acidosis: Secondary | ICD-10-CM | POA: Diagnosis not present

## 2023-03-28 DIAGNOSIS — I5041 Acute combined systolic (congestive) and diastolic (congestive) heart failure: Secondary | ICD-10-CM | POA: Diagnosis not present

## 2023-03-28 DIAGNOSIS — R570 Cardiogenic shock: Secondary | ICD-10-CM

## 2023-03-28 DIAGNOSIS — J9811 Atelectasis: Secondary | ICD-10-CM | POA: Diagnosis not present

## 2023-03-28 DIAGNOSIS — I700 Atherosclerosis of aorta: Secondary | ICD-10-CM | POA: Diagnosis not present

## 2023-03-28 DIAGNOSIS — N1832 Chronic kidney disease, stage 3b: Secondary | ICD-10-CM | POA: Diagnosis not present

## 2023-03-28 LAB — APTT: aPTT: 82 seconds — ABNORMAL HIGH (ref 24–36)

## 2023-03-28 LAB — CBG MONITORING, ED
Glucose-Capillary: 255 mg/dL — ABNORMAL HIGH (ref 70–99)
Glucose-Capillary: 294 mg/dL — ABNORMAL HIGH (ref 70–99)
Glucose-Capillary: 201 mg/dL — ABNORMAL HIGH (ref 70–99)

## 2023-03-28 LAB — CBC
HCT: 28.8 % — ABNORMAL LOW (ref 36.0–46.0)
Hemoglobin: 9.4 g/dL — ABNORMAL LOW (ref 12.0–15.0)
MCH: 28.4 pg (ref 26.0–34.0)
MCHC: 32.6 g/dL (ref 30.0–36.0)
MCV: 87 fL (ref 80.0–100.0)
Platelets: 225 10*3/uL (ref 150–400)
RBC: 3.31 MIL/uL — ABNORMAL LOW (ref 3.87–5.11)
RDW: 13 % (ref 11.5–15.5)
WBC: 15.2 10*3/uL — ABNORMAL HIGH (ref 4.0–10.5)
nRBC: 0 % (ref 0.0–0.2)

## 2023-03-28 LAB — PHOSPHORUS: Phosphorus: 5.9 mg/dL — ABNORMAL HIGH (ref 2.5–4.6)

## 2023-03-28 LAB — BASIC METABOLIC PANEL
Anion gap: 13 (ref 5–15)
BUN: 76 mg/dL — ABNORMAL HIGH (ref 8–23)
CO2: 18 mmol/L — ABNORMAL LOW (ref 22–32)
Calcium: 7.9 mg/dL — ABNORMAL LOW (ref 8.9–10.3)
Chloride: 94 mmol/L — ABNORMAL LOW (ref 98–111)
Creatinine, Ser: 2.84 mg/dL — ABNORMAL HIGH (ref 0.44–1.00)
GFR, Estimated: 16 mL/min — ABNORMAL LOW (ref >60)
Glucose, Bld: 218 mg/dL — ABNORMAL HIGH (ref 70–99)
Potassium: 3.9 mmol/L (ref 3.5–5.1)
Sodium: 125 mmol/L — ABNORMAL LOW (ref 135–145)

## 2023-03-28 LAB — CULTURE, BLOOD (ROUTINE X 2)
Culture: NO GROWTH
Special Requests: ADEQUATE
Special Requests: ADEQUATE

## 2023-03-28 LAB — HEPARIN LEVEL (UNFRACTIONATED): Heparin Unfractionated: >1.1 IU/mL — ABNORMAL HIGH (ref 0.30–0.70)

## 2023-03-28 LAB — GLUCOSE, CAPILLARY: Glucose-Capillary: 209 mg/dL — ABNORMAL HIGH (ref 70–99)

## 2023-03-28 LAB — MAGNESIUM: Magnesium: 2.1 mg/dL (ref 1.7–2.4)

## 2023-03-28 LAB — OSMOLALITY: Osmolality: 291 mOsm/kg (ref 275–295)

## 2023-03-28 MED ORDER — CHLORHEXIDINE GLUCONATE CLOTH 2 % EX PADS
6.0000 | MEDICATED_PAD | Freq: Every day | CUTANEOUS | Status: DC
  Administered 2023-03-28: 6 via TOPICAL

## 2023-03-28 MED ORDER — SODIUM CHLORIDE 0.9 % IV SOLN
1.0000 g | INTRAVENOUS | Status: AC
  Administered 2023-03-28 – 2023-03-29 (×2): 1 g via INTRAVENOUS
  Filled 2023-03-28 (×2): qty 10

## 2023-03-28 MED ORDER — INSULIN ASPART 100 UNIT/ML IJ SOLN
0.0000 [IU] | Freq: Three times a day (TID) | INTRAMUSCULAR | Status: DC
  Administered 2023-03-28 (×2): 3 [IU] via SUBCUTANEOUS
  Administered 2023-03-29 (×2): 1 [IU] via SUBCUTANEOUS
  Administered 2023-03-30: 2 [IU] via SUBCUTANEOUS
  Administered 2023-03-30: 5 [IU] via SUBCUTANEOUS
  Administered 2023-03-30: 2 [IU] via SUBCUTANEOUS
  Filled 2023-03-28 (×7): qty 1

## 2023-03-28 MED ORDER — INSULIN GLARGINE-YFGN 100 UNIT/ML ~~LOC~~ SOLN
10.0000 [IU] | Freq: Every day | SUBCUTANEOUS | Status: DC
  Administered 2023-03-28: 10 [IU] via SUBCUTANEOUS
  Filled 2023-03-28 (×2): qty 0.1

## 2023-03-28 MED ORDER — DEXTROMETHORPHAN POLISTIREX ER 30 MG/5ML PO SUER
15.0000 mg | Freq: Every day | ORAL | Status: DC
  Administered 2023-03-28: 15 mg via ORAL
  Filled 2023-03-28 (×5): qty 2.5

## 2023-03-28 NOTE — Plan of Care (Signed)
  Problem: Education: Goal: Ability to demonstrate management of disease process will improve Outcome: Progressing Goal: Ability to verbalize understanding of medication therapies will improve Outcome: Progressing Goal: Individualized Educational Video(s) Outcome: Progressing   Problem: Activity: Goal: Capacity to carry out activities will improve Outcome: Progressing   Problem: Cardiac: Goal: Ability to achieve and maintain adequate cardiopulmonary perfusion will improve Outcome: Progressing   Problem: Education: Goal: Knowledge of disease or condition will improve Outcome: Progressing Goal: Knowledge of the prescribed therapeutic regimen will improve Outcome: Progressing Goal: Individualized Educational Video(s) Outcome: Progressing   Problem: Activity: Goal: Ability to tolerate increased activity will improve Outcome: Progressing Goal: Will verbalize the importance of balancing activity with adequate rest periods Outcome: Progressing   Problem: Respiratory: Goal: Ability to maintain a clear airway will improve Outcome: Progressing Goal: Levels of oxygenation will improve Outcome: Progressing Goal: Ability to maintain adequate ventilation will improve Outcome: Progressing   Problem: Activity: Goal: Ability to tolerate increased activity will improve Outcome: Progressing   Problem: Clinical Measurements: Goal: Ability to maintain a body temperature in the normal range will improve Outcome: Progressing   Problem: Respiratory: Goal: Ability to maintain adequate ventilation will improve Outcome: Progressing Goal: Ability to maintain a clear airway will improve Outcome: Progressing   Problem: Education: Goal: Ability to describe self-care measures that may prevent or decrease complications (Diabetes Survival Skills Education) will improve Outcome: Progressing Goal: Individualized Educational Video(s) Outcome: Progressing   Problem: Coping: Goal: Ability to  adjust to condition or change in health will improve Outcome: Progressing   Problem: Fluid Volume: Goal: Ability to maintain a balanced intake and output will improve Outcome: Progressing   Problem: Health Behavior/Discharge Planning: Goal: Ability to identify and utilize available resources and services will improve Outcome: Progressing Goal: Ability to manage health-related needs will improve Outcome: Progressing   Problem: Metabolic: Goal: Ability to maintain appropriate glucose levels will improve Outcome: Progressing   Problem: Nutritional: Goal: Maintenance of adequate nutrition will improve Outcome: Progressing Goal: Progress toward achieving an optimal weight will improve Outcome: Progressing   Problem: Skin Integrity: Goal: Risk for impaired skin integrity will decrease Outcome: Progressing   Problem: Tissue Perfusion: Goal: Adequacy of tissue perfusion will improve Outcome: Progressing   Problem: Education: Goal: Knowledge of General Education information will improve Description: Including pain rating scale, medication(s)/side effects and non-pharmacologic comfort measures Outcome: Progressing   Problem: Health Behavior/Discharge Planning: Goal: Ability to manage health-related needs will improve Outcome: Progressing   Problem: Clinical Measurements: Goal: Ability to maintain clinical measurements within normal limits will improve Outcome: Progressing Goal: Will remain free from infection Outcome: Progressing Goal: Diagnostic test results will improve Outcome: Progressing Goal: Respiratory complications will improve Outcome: Progressing Goal: Cardiovascular complication will be avoided Outcome: Progressing   Problem: Activity: Goal: Risk for activity intolerance will decrease Outcome: Progressing   Problem: Nutrition: Goal: Adequate nutrition will be maintained Outcome: Progressing   Problem: Coping: Goal: Level of anxiety will  decrease Outcome: Progressing   Problem: Elimination: Goal: Will not experience complications related to bowel motility Outcome: Progressing Goal: Will not experience complications related to urinary retention Outcome: Progressing   Problem: Pain Managment: Goal: General experience of comfort will improve Outcome: Progressing   Problem: Safety: Goal: Ability to remain free from injury will improve Outcome: Progressing   Problem: Skin Integrity: Goal: Risk for impaired skin integrity will decrease Outcome: Progressing

## 2023-03-28 NOTE — Progress Notes (Signed)
ANTICOAGULATION CONSULT NOTE  Pharmacy Consult for heparin infusion Indication: ACS/STEMI/ apixaban for Afib PTA  Allergies  Allergen Reactions   Cefdinir Diarrhea   Saxagliptin Diarrhea   Epinephrine Other (See Comments)    Patient does not remember what happens when she uses this   Evolocumab     Pain with injection   Atorvastatin Other (See Comments)    Muscle aches   Codeine Other (See Comments)    Upset stomach   Ezetimibe Other (See Comments)    Myalgias(ZETIA)   Limonene Rash    Patient does not recall this reaction   Nitrofurantoin Rash and Other (See Comments)    Pruitus   Sulfa Antibiotics Rash and Other (See Comments)    Sore mouth     Patient Measurements: Height: 5' (152.4 cm) Weight: 56.2 kg (124 lb) IBW/kg (Calculated) : 45.5 Heparin Dosing Weight: 56.2 kg  Vital Signs: Temp: 97.9 F (36.6 C) (06/02 0514) Temp Source: Oral (06/02 0514) BP: 119/61 (06/02 0500) Pulse Rate: 71 (06/02 0500)  Labs: Recent Labs    03/26/23 0444 03/26/23 0700 03/26/23 0700 03/26/23 1428 03/26/23 1711 03/26/23 2228 03/27/23 0437 03/27/23 0802 03/27/23 1358 03/28/23 0531  HGB 10.3*  --   --   --   --   --  9.5*  --   --  9.4*  HCT 31.7*  --   --   --   --   --  28.8*  --   --  28.8*  PLT 264  --   --   --   --   --  235  --   --  225  APTT  --  36   < > 95*  --    < >  --  68* 89* 82*  LABPROT  --  17.5*  --   --   --   --   --   --   --   --   INR  --  1.4*  --   --   --   --   --   --   --   --   HEPARINUNFRC  --   --   --   --   --   --   --  >1.10*  --  >1.10*  CREATININE 2.03*  --   --  2.36*  --   --  2.59*  --   --  2.84*  TROPONINIHS 1,186*  --   --  1,165* 1,263*  --   --   --   --   --    < > = values in this interval not displayed.     Estimated Creatinine Clearance: 11.2 mL/min (A) (by C-G formula based on SCr of 2.84 mg/dL (H)).   Medical History: Past Medical History:  Diagnosis Date   Acute CHF (congestive heart failure) (HCC) 02/18/2020    Acute diastolic CHF (congestive heart failure) (HCC)    Acute kidney injury superimposed on CKD (HCC)    Allergies    Anxiety    Arthritis    spine and shoulder   Atherosclerosis of artery of extremity with rest pain (HCC) 10/12/2018   Cancer (HCC)    skin   CHF (congestive heart failure) (HCC)    Diabetes mellitus without complication (HCC)    Diverticulosis of large intestine with hemorrhage    GI bleeding 12/25/2019   Heart murmur    Hyperlipidemia    Hypertension    Macula lutea degeneration  Mitral and aortic valve disease    Myocardial infarction (HCC)    may have had a "light" heart attack   Non-ST elevation (NSTEMI) myocardial infarction (HCC)    Occasional tremors    PAD (peripheral artery disease) (HCC)    Rectal bleeding    Shingles    patient unaware but daughter confirms. it was a long time ago   Stroke [facility]) 01/2017   may have had a slight stroke   TIA (transient ischemic attack) 01/2017   UTI (urinary tract infection)    Vascular disease, peripheral (HCC)     Medications:  PTA Meds: Apixaban 2.5 mg BID, last dose 5/30 @ 1856  Assessment: Pt is a 88 yo female initially presenting to ED w/ COPD exacerbation, now c/o increase WOB , found with increasing Troponin I levels.  5/31 1428 aPTT=95 Therapeutic x1 5/31 2228 aPTT=120, supratherapeutic 6/1   0802 aPTT=68/HL>1.10   aPTT therapeuticx1 6/1   1358 aPTT= 89  Therapeuticx2 6/2   0531 aPTT = 82, therapeutic x 3 / HL > 1.1, not correlating   Goal of Therapy:  Heparin level 0.3-0.7 units/ml aPTT 66-102 seconds Monitor platelets by anticoagulation protocol: Yes   Plan:  Continue heparin infusion at 650 units/hr Will follow aPTT until correlation w/ HL confirmed.  Will check aPTT/HL with am labs HL & CBC daily while on heparin  Otelia Sergeant, PharmD, [facility] 03/28/2023 6:35 AM

## 2023-03-28 NOTE — Progress Notes (Signed)
[facility]  ROUNDING NOTE   Subjective:   Ms. SREENIDHI MARALDO was admitted to [facility] on 03/25/2023 for COPD exacerbation [facility]) [J44.1] Chest pain [R07.9]  Started on IVF sodium bicarbonate yesterday.   Patient complains of more shortness of breath, wheezing and increased oxygen requirements to 4L [place]  Creatinine 2.84 (2.56)   Objective:  Vital signs in last 24 hours:  Temp:  [97.4 F (36.3 C)-98 F (36.7 C)] 98 F (36.7 C) (06/02 1304) Pulse Rate:  [70-88] 73 (06/02 1330) Resp:  [16-28] 24 (06/02 1330) BP: (102-130)/(52-87) 129/71 (06/02 1330) SpO2:  [92 %-98 %] 93 % (06/02 1330)  Weight change:  Filed Weights   03/25/23 0931  Weight: 56.2 kg    Intake/Output: I/O last 3 completed shifts: In: 1749.2 [I.V.:873; IV Piggyback:876.2] Out: 400 [Urine:400]   Intake/Output this shift:  No intake/output data recorded.  Physical Exam: General: NAD,   Head: Normocephalic, atraumatic. Moist oral mucosal membranes  Eyes: Anicteric, PERRL  Neck: Supple, trachea midline  Lungs:  Bilateral wheezing, no crackles  Heart: Regular rate and rhythm  Abdomen:  Soft, nontender,   Extremities:  no peripheral edema.  Neurologic: Nonfocal, moving all four extremities  Skin: No lesions        Basic Metabolic Panel: Recent Labs  Lab 03/25/23 1857 03/26/23 0444 03/26/23 1428 03/27/23 0437 03/28/23 0531  NA 125* 125* 125* 126* 125*  K 4.2 4.5 4.7 4.4 3.9  CL 95* 96* 94* 97* 94*  CO2 13* 18* 16* 15* 18*  GLUCOSE 338* 264* 231* 151* 218*  BUN 44* 48* 55* 60* 76*  CREATININE 2.00* 2.03* 2.36* 2.59* 2.84*  CALCIUM 8.5* 8.8* 8.6* 8.4* 7.9*  MG  --   --   --  2.1 2.1  PHOS  --   --   --   --  5.9*     Liver Function Tests: Recent Labs  Lab 03/25/23 1001  AST 22  ALT 16  ALKPHOS 73  BILITOT 0.6  PROT 6.2*  ALBUMIN 3.5    No results for input(s): "LIPASE", "AMYLASE" in the last 168 hours. No results for input(s): "AMMONIA" in the last 168  hours.  CBC: Recent Labs  Lab 03/25/23 1001 03/26/23 0444 03/27/23 0437 03/28/23 0531  WBC 12.0* 20.7* 21.9* 15.2*  HGB 10.9* 10.3* 9.5* 9.4*  HCT 33.7* 31.7* 28.8* 28.8*  MCV 88.5 87.6 88.3 87.0  PLT 268 264 235 225     Cardiac Enzymes: No results for input(s): "CKTOTAL", "CKMB", "CKMBINDEX", "TROPONINI" in the last 168 hours.  BNP: Invalid input(s): "POCBNP"  CBG: Recent Labs  Lab 03/27/23 1623 03/27/23 2102 03/28/23 0234 03/28/23 0529 03/28/23 1205  GLUCAP 214* 207* 255* 294* 201*     Microbiology: Results for orders placed or performed during the hospital encounter of 03/25/23  Blood Culture (routine x 2)     Status: None (Preliminary result)   Collection Time: 03/25/23 12:09 PM   Specimen: BLOOD  Result Value Ref Range Status   Specimen Description BLOOD LEFT [facility]  Final   Special Requests   Final    BOTTLES DRAWN AEROBIC AND ANAEROBIC Blood Culture adequate volume   Culture   Final    NO GROWTH 3 DAYS Performed at [facility], [address] [place], [place] [address]    Report Status PENDING  Incomplete  Blood Culture (routine x 2)     Status: None (Preliminary result)   Collection Time: 03/25/23 12:10 PM   Specimen: BLOOD  Result Value Ref Range Status   Specimen Description BLOOD LEFT FA  Final   Special Requests   Final    BOTTLES DRAWN AEROBIC AND ANAEROBIC Blood Culture adequate volume   Culture   Final    NO GROWTH 3 DAYS Performed at [facility], [address] [place], [place] [address]    Report Status PENDING  Incomplete  SARS Coronavirus 2 by RT PCR (hospital order, performed in [facility] hospital lab) *cepheid single result test* Anterior Nasal Swab     Status: None   Collection Time: 03/25/23  1:18 PM   Specimen: Anterior Nasal Swab  Result Value Ref Range Status   SARS Coronavirus 2 by RT PCR NEGATIVE NEGATIVE Final    Comment: (NOTE) SARS-CoV-2 target nucleic acids are NOT DETECTED.  The SARS-CoV-2 RNA  is generally detectable in upper and lower respiratory specimens during the acute phase of infection. The lowest concentration of SARS-CoV-2 viral copies this assay can detect is 250 copies / mL. A negative result does not preclude SARS-CoV-2 infection and should not be used as the sole basis for treatment or other patient management decisions.  A negative result may occur with improper specimen collection / handling, submission of specimen other than nasopharyngeal swab, presence of viral mutation(s) within the areas targeted by this assay, and inadequate number of viral copies (<250 copies / mL). A negative result must be combined with clinical observations, patient history, and epidemiological information.  Fact Sheet for Patients:   RoadLapTop.co.za  Fact Sheet for Healthcare Providers: http://kim-miller.com/  This test is not yet approved or  cleared by the [place] FDA and has been authorized for detection and/or diagnosis of SARS-CoV-2 by FDA under an Emergency Use Authorization (EUA).  This EUA will remain in effect (meaning this test can be used) for the duration of the COVID-19 declaration under Section 564(b)(1) of the Act, 21 U.S.C. section 360bbb-3(b)(1), unless the authorization is terminated or revoked sooner.  Performed at [facility], [address] [place], [place] [address]   MRSA Next Gen by PCR, Nasal     Status: None   Collection Time: 03/27/23  4:40 AM   Specimen: Nasal Mucosa; Nasal Swab  Result Value Ref Range Status   MRSA by PCR Next Gen NOT DETECTED NOT DETECTED Final    Comment: (NOTE) The GeneXpert MRSA Assay (FDA approved for NASAL specimens only), is one component of a comprehensive MRSA colonization surveillance program. It is not intended to diagnose MRSA infection nor to guide or monitor treatment for MRSA infections. Test performance is not FDA approved in patients less than 53  years old. Performed at [facility], [address] [place], [place] [address]     Coagulation Studies: Recent Labs    03/26/23 0700  LABPROT 17.5*  INR 1.4*     Urinalysis: No results for input(s): "COLORURINE", "LABSPEC", "PHURINE", "GLUCOSEU", "HGBUR", "BILIRUBINUR", "KETONESUR", "PROTEINUR", "UROBILINOGEN", "NITRITE", "LEUKOCYTESUR" in the last 72 hours.  Invalid input(s): "APPERANCEUR"    Imaging: CT CHEST WO CONTRAST  Result Date: 03/28/2023 CLINICAL DATA:  88 year old female with shortness of breath. EXAM: CT CHEST WITHOUT CONTRAST TECHNIQUE: Multidetector CT imaging of the chest was performed following the standard protocol without IV contrast. RADIATION DOSE REDUCTION: This exam was performed according to the departmental dose-optimization program which includes automated exposure control, adjustment of the mA and/or kV according to patient size and/or use of iterative reconstruction technique. COMPARISON:  Portable chest 03/25/2023 and earlier. FINDINGS: Cardiovascular: Extensive Calcified  aortic atherosclerosis. Heavily calcified aberrant origin of the right subclavian artery. Vascular patency is not evaluated in the absence of IV contrast. Mild cardiomegaly. No pericardial effusion. Calcified coronary artery plaque. Mediastinum/Nodes: Small reactive appearing mediastinal lymph nodes. No mediastinal mass. Lungs/Pleura: Moderate bilateral layering pleural effusions with simple fluid density suggesting transudate. Compressive lower lobe atelectasis. Superimposed moderate to severe centrilobular emphysema, most pronounced in the upper lobes. Major airways remain patent. No air bronchograms. Mild emphysema related architectural distortion including in the lingula. Upper Abdomen: Negative visible noncontrast spleen, pancreas, adrenal glands, bowel. Occasional circumscribed and simple fluid density areas in the partially visible liver appear to be benign patent cysts (no  follow-up imaging recommended). Similar exophytic left renal upper pole cyst suspected (no follow-up imaging recommended). Renal vascular calcifications. Musculoskeletal: Severe chronic thoracolumbar junction disc and endplate degeneration in the setting of levoconvex scoliosis there. Degenerative vertebral body sclerosis. Vacuum disc. No acute or suspicious osseous lesion identified. IMPRESSION: 1. Moderate layering bilateral pleural effusions and lower lobe atelectasis superimposed on fairly advanced Emphysema (ICD10-J43.9). 2. Advanced  Aortic Atherosclerosis (ICD10-I70.0). 3. Spinal degeneration. Electronically Signed   By: Odessa Fleming M.D.   On: 03/28/2023 10:39     Medications:    azithromycin 500 mg (03/28/23 1331)   cefTRIAXone (ROCEPHIN)  IV Stopped (03/28/23 1331)   heparin 650 Units/hr (03/28/23 0515)    arformoterol  15 mcg Nebulization BID   bisoprolol  5 mg Oral Daily   clotrimazole  1 Applicatorful Vaginal QHS   cyanocobalamin  1,000 mcg Oral Daily   promethazine  6.25 mg Oral QHS   And   dextromethorphan  15 mg Oral QHS   feeding supplement (GLUCERNA SHAKE)  237 mL Oral TID BM   ferrous sulfate  325 mg Oral Q breakfast   gabapentin  100 mg Oral QHS   insulin aspart  0-9 Units Subcutaneous TID WC   insulin glargine-yfgn  10 Units Subcutaneous Daily   multivitamin  1 tablet Oral BID   revefenacin  175 mcg Nebulization Daily   rosuvastatin  10 mg Oral Once per day on Mon Wed Fri Sat   sodium chloride  1 g Oral BID WC   acetaminophen, dextromethorphan-guaiFENesin, diphenhydrAMINE, hydrALAZINE, levalbuterol, melatonin, metoprolol tartrate, ondansetron (ZOFRAN) IV, oxyCODONE, senna-docusate, traZODone  Assessment/ Plan:  Ms. MYALEE TACK is a 88 y.o.  female with diabetes mellitus type II, hypertension, coronary artery disease, peripheral arterial disease, tremor, CVA, congestive heart failure and anemia who is admitted to [facility] on 03/25/2023 for COPD exacerbation (HCC)  [J44.1] Chest pain [R07.9]  Acute kidney injury with acute metabolic acidosis on chronic kidney disease stage IIIB: baseline creatinine of 1.48, GFR of 34 on 12/09/2022. Suspect patient is actually volume depleted.  - hold IV fluids - hold diuretics.   Hypertension with chronic kidney disease: with chronic systolic and diastolic congestive heart failure.  - Holding diuretics.  - holding cardiology input.   Hyponatremia: Urine sodium elevated.  - recheck urine lytes and serum osm.    LOS: 3 Aziya Arena 6/2/20242:08 PM

## 2023-03-28 NOTE — Progress Notes (Addendum)
PROGRESS NOTE    Jill Hudson  ZOX:[id number] DOB: February 03, 1935 DOA: 03/25/2023 PCP: Reubin Milan, MD   Brief Narrative:  This 88 year old female with history of COPD, HTN, HLD, DM2, stroke, TIA, GERD, anxiety, PVD, CAD, MI, former tobacco use, CKD stage III, GI bleed, iron deficiency anemia, PAD status post PCI comes to the hospital with shortness of breath and hypoxia. Patient was found to have bilateral pulmonary edema /effusion with elevated BNP.  Patient is admitted for further evaluation.  Cardiology is consulted.  Patient will eventually need right and left heart cath pending improvement in renal functions.  Assessment & Plan:   Principal Problem:   COPD exacerbation (HCC) Active Problems:   Severe sepsis (HCC)   Acute on chronic diastolic CHF (congestive heart failure) (HCC)   Essential hypertension   History of CVA (cerebrovascular accident)   CAD (coronary artery disease)   Myocardial injury   Atrial fibrillation with RVR (HCC)   Type II diabetes mellitus with renal manifestations (HCC)   Chronic kidney disease, stage 3a (HCC)   Hyponatremia   HLD (hyperlipidemia)   Iron deficiency anemia   PAD (peripheral artery disease) (HCC)   AKI (acute kidney injury) (HCC)   Chest pain  Acute systolic CHF: Acute on chronic diastolic CHF:  Class III Previous echocardiogram in March 2023 showed LVEF of 55%.   Now has clinical evidence of bilateral pulmonary edema / effusion and elevated BNP.   Repeat echo shows LVEF 30 to 35%. Cardiology consulted.  Patient will need left and right heart catheterization.  She will also need TEE to better evaluate her mitral valve.  She is not a candidate for catheterization at this time due to worsening renal functions. Diuresis held yesterday and started on fluids Prognosis guarded, will involve palliative PT/OT consults   NSTEMI: History of CAD Troponin have significantly trended up.  LDL 41, A1c 7.9.  Continue to trend troponin. Not a  candidate for catheterization at this time due to worsening renal functions. On heparin gtt   Hyponatremia: Suspect from chf, aki on ckd Continue to monitor Treat underlying problems Is on salt tabs   Acute kidney injury on CKD stage III A: Baseline creatinine 1.3.  Today is 2.84, worsening. Closely monitor.   Bladder scan showed 350cc, foley placed 5/31 for accurate I/Os Uop 400 last 24, has 350 in her bag Now in bicarb gtt   Metabolic acidosis Bicarb gtt running   COPD exacerbation: CAP Pleural effusions Initially wheezing now resolved. Blood cultures neg. Asymetric opacities seen on cxr - will stop steroids - continue abx (ceftriaxone/azithromycin) - CT chest to further characterize - f/u respiratory viral panel   Essential hypertension: Continue amlodipine.  IV as needed.   History of CVA (cerebrovascular accident) On heparin drip and statin.   Atrial fibrillation with RVR (HCC) History of paroxysmal atrial fibrillation Continue metoprolol and heparin drip.   Type II diabetes mellitus with renal manifestations [facility]):  Recent A1c 7.9.  here hyperglycemic with steroids - will increase basal insulin   HLD (hyperlipidemia) Continue Crestor   Iron deficiency anemia: Hemoglobin 10.9 (11.2 on 03/24/2022) iron supplement   PAD (peripheral artery disease) (HCC): S/p of a stent placement to the left leg -Patient is on Crestor and Eliquis at home     DVT prophylaxis: Heparin drip Code Status: Full code Family Communication: daughter updated telephonically 6/2 Disposition Plan:     Status is: Inpatient Remains inpatient appropriate because: Acutely ill , continue hospital stay due to  multiple medical issues.   Consultants:  Cardiology  Procedures: None  Antimicrobials:  Anti-infectives (From admission, onward)    Start     Dose/Rate Route Frequency Ordered Stop   03/26/23 1600  vancomycin (VANCOREADY) IVPB 1250 mg/250 mL        1,250 mg 166.7 mL/hr over  90 Minutes Intravenous  Once 03/26/23 1452 03/26/23 1838   03/26/23 1452  vancomycin variable dose per unstable renal function (pharmacist dosing)  Status:  Discontinued         Does not apply See admin instructions 03/26/23 1452 03/27/23 1335   03/25/23 1145  cefTRIAXone (ROCEPHIN) 2 g in sodium chloride 0.9 % 100 mL IVPB        2 g 200 mL/hr over 30 Minutes Intravenous Every 24 hours 03/25/23 1135 03/30/23 1144   03/25/23 1145  azithromycin (ZITHROMAX) 500 mg in sodium chloride 0.9 % 250 mL IVPB        500 mg 250 mL/hr over 60 Minutes Intravenous Every 24 hours 03/25/23 1135 03/30/23 1144      Subjective: Patient seen and examined at bedside.  Overnight events noted. Says sob is stable but persistent, no chest pain  Objective: Vitals:   03/28/23 0430 03/28/23 0500 03/28/23 0514 03/28/23 0921  BP: (!) 121/59 119/61  130/63  Pulse: 79 71  80  Resp: (!) 22 17  16   Temp:   97.9 F (36.6 C)   TempSrc:   Oral   SpO2: 92% 92%  96%  Weight:      Height:        Intake/Output Summary (Last 24 hours) at 03/28/2023 0938 Last data filed at 03/28/2023 0515 Gross per 24 hour  Intake 1749.19 ml  Output 400 ml  Net 1349.19 ml   Filed Weights   03/25/23 0931  Weight: 56.2 kg    Examination:  General exam: Appears calm and comfortable, deconditioned, not in any acute distress. Respiratory system: Clear to auscultation. Respiratory effort normal.  RR 15 Cardiovascular system: distant heart sounds Gastrointestinal system: Abdomen is soft, non tender, non distended, bowel sounds+ Central nervous system: Alert, a little confused Extremities: mild pitting LE edema to knees Skin: No rashes, lesions or ulcers Psychiatry: Judgement and insight appear normal. Mood & affect appropriate.     Data Reviewed: I have personally reviewed following labs and imaging studies  CBC: Recent Labs  Lab 03/25/23 1001 03/26/23 0444 03/27/23 0437 03/28/23 0531  WBC 12.0* 20.7* 21.9* 15.2*  HGB 10.9*  10.3* 9.5* 9.4*  HCT 33.7* 31.7* 28.8* 28.8*  MCV 88.5 87.6 88.3 87.0  PLT 268 264 235 225   Basic Metabolic Panel: Recent Labs  Lab 03/25/23 1857 03/26/23 0444 03/26/23 1428 03/27/23 0437 03/28/23 0531  NA 125* 125* 125* 126* 125*  K 4.2 4.5 4.7 4.4 3.9  CL 95* 96* 94* 97* 94*  CO2 13* 18* 16* 15* 18*  GLUCOSE 338* 264* 231* 151* 218*  BUN 44* 48* 55* 60* 76*  CREATININE 2.00* 2.03* 2.36* 2.59* 2.84*  CALCIUM 8.5* 8.8* 8.6* 8.4* 7.9*  MG  --   --   --  2.1 2.1  PHOS  --   --   --   --  5.9*   GFR: Estimated Creatinine Clearance: 11.2 mL/min (A) (by C-G formula based on SCr of 2.84 mg/dL (H)). Liver Function Tests: Recent Labs  Lab 03/25/23 1001  AST 22  ALT 16  ALKPHOS 73  BILITOT 0.6  PROT 6.2*  ALBUMIN 3.5   No  results for input(s): "LIPASE", "AMYLASE" in the last 168 hours. No results for input(s): "AMMONIA" in the last 168 hours. Coagulation Profile: Recent Labs  Lab 03/26/23 0700  INR 1.4*   Cardiac Enzymes: No results for input(s): "CKTOTAL", "CKMB", "CKMBINDEX", "TROPONINI" in the last 168 hours. BNP (last 3 results) No results for input(s): "PROBNP" in the last 8760 hours. HbA1C: Recent Labs    03/25/23 1318  HGBA1C 7.9*   CBG: Recent Labs  Lab 03/27/23 1328 03/27/23 1623 03/27/23 2102 03/28/23 0234 03/28/23 0529  GLUCAP 202* 214* 207* 255* 294*   Lipid Profile: Recent Labs    03/26/23 0444  CHOL 117  HDL 65  LDLCALC 41  TRIG 53  CHOLHDL 1.8   Thyroid Function Tests: No results for input(s): "TSH", "T4TOTAL", "FREET4", "T3FREE", "THYROIDAB" in the last 72 hours. Anemia Panel: No results for input(s): "VITAMINB12", "FOLATE", "FERRITIN", "TIBC", "IRON", "RETICCTPCT" in the last 72 hours. Sepsis Labs: Recent Labs  Lab 03/25/23 1209 03/25/23 1318 03/25/23 1548 03/25/23 1748 03/26/23 0444 03/26/23 0850 03/26/23 1110  PROCALCITON 0.16  --   --   --  2.84  --   --   LATICACIDVEN 2.9*   < > 5.5* 6.7*  --  2.8* 2.0*   < > =  values in this interval not displayed.    Recent Results (from the past 240 hour(s))  Blood Culture (routine x 2)     Status: None (Preliminary result)   Collection Time: 03/25/23 12:09 PM   Specimen: BLOOD  Result Value Ref Range Status   Specimen Description BLOOD LEFT [facility]  Final   Special Requests   Final    BOTTLES DRAWN AEROBIC AND ANAEROBIC Blood Culture adequate volume   Culture   Final    NO GROWTH 3 DAYS Performed at [facility], [address] [place], [place] [address]    Report Status PENDING  Incomplete  Blood Culture (routine x 2)     Status: None (Preliminary result)   Collection Time: 03/25/23 12:10 PM   Specimen: BLOOD  Result Value Ref Range Status   Specimen Description BLOOD LEFT FA  Final   Special Requests   Final    BOTTLES DRAWN AEROBIC AND ANAEROBIC Blood Culture adequate volume   Culture   Final    NO GROWTH 3 DAYS Performed at [facility], [address] [place], [place] [address]    Report Status PENDING  Incomplete  SARS Coronavirus 2 by RT PCR (hospital order, performed in [facility] hospital lab) *cepheid single result test* Anterior Nasal Swab     Status: None   Collection Time: 03/25/23  1:18 PM   Specimen: Anterior Nasal Swab  Result Value Ref Range Status   SARS Coronavirus 2 by RT PCR NEGATIVE NEGATIVE Final    Comment: (NOTE) SARS-CoV-2 target nucleic acids are NOT DETECTED.  The SARS-CoV-2 RNA is generally detectable in upper and lower respiratory specimens during the acute phase of infection. The lowest concentration of SARS-CoV-2 viral copies this assay can detect is 250 copies / mL. A negative result does not preclude SARS-CoV-2 infection and should not be used as the sole basis for treatment or other patient management decisions.  A negative result may occur with improper specimen collection / handling, submission of specimen other than nasopharyngeal swab, presence of viral mutation(s) within the areas  targeted by this assay, and inadequate number of viral copies (<250 copies / mL). A negative result must be combined with clinical observations, patient history,  and epidemiological information.  Fact Sheet for Patients:   RoadLapTop.co.za  Fact Sheet for Healthcare Providers: http://kim-miller.com/  This test is not yet approved or  cleared by the [place] FDA and has been authorized for detection and/or diagnosis of SARS-CoV-2 by FDA under an Emergency Use Authorization (EUA).  This EUA will remain in effect (meaning this test can be used) for the duration of the COVID-19 declaration under Section 564(b)(1) of the Act, 21 U.S.C. section 360bbb-3(b)(1), unless the authorization is terminated or revoked sooner.  Performed at [facility], [address] [place], [place] [address]   MRSA Next Gen by PCR, Nasal     Status: None   Collection Time: 03/27/23  4:40 AM   Specimen: Nasal Mucosa; Nasal Swab  Result Value Ref Range Status   MRSA by PCR Next Gen NOT DETECTED NOT DETECTED Final    Comment: (NOTE) The GeneXpert MRSA Assay (FDA approved for NASAL specimens only), is one component of a comprehensive MRSA colonization surveillance program. It is not intended to diagnose MRSA infection nor to guide or monitor treatment for MRSA infections. Test performance is not FDA approved in patients less than 7 years old. Performed at [facility], [address], [place], [place] [address]          Radiology Studies: ECHOCARDIOGRAM COMPLETE  Result Date: 03/26/2023    ECHOCARDIOGRAM REPORT   Patient Name:   BINNIE ORTEZ Date of Exam: 03/26/2023 Medical Rec #:  [id number]     Height:       60.0 in Accession #:    [id number]    Weight:       124.0 lb Date of Birth:  December 20, 1934      BSA:          1.523 m Patient Age:    88 years      BP:           122/52 mmHg Patient Gender: F             HR:           108 bpm. Exam  Location:  [facility] Procedure: 2D Echo, Cardiac Doppler and Color Doppler Indications:     CHF  History:         Patient has prior history of Echocardiogram examinations, most                  recent 02/19/2020. CHF, CAD, PAD, COPD and Stroke,                  Arrythmias:Atrial Fibrillation; Risk Factors:Hypertension,                  Diabetes and Dyslipidemia.  Sonographer:     Mikki Harbor Referring Phys:  [id number] Brien Few NIU Diagnosing Phys: Lorine Bears MD IMPRESSIONS  1. Left ventricular ejection fraction, by estimation, is 30 to 35%. The left ventricle has moderately decreased function. Left ventricular endocardial border not optimally defined to evaluate regional wall motion. The left ventricular internal cavity size was mildly dilated. Left ventricular diastolic parameters are consistent with Grade II diastolic dysfunction (pseudonormalization).  2. Right ventricular systolic function is normal. The right ventricular size is normal. There is mildly elevated pulmonary artery systolic pressure.  3. Left atrial size was mildly dilated.  4. The mitral valve is degenerative. Severe mitral valve regurgitation. No evidence of mitral stenosis.  5. The aortic valve is normal in structure. Aortic valve regurgitation is not visualized. Aortic valve  sclerosis/calcification is present, without any evidence of aortic stenosis.  6. The inferior vena cava is normal in size with <50% respiratory variability, suggesting right atrial pressure of 8 mmHg. FINDINGS  Left Ventricle: Left ventricular ejection fraction, by estimation, is 30 to 35%. The left ventricle has moderately decreased function. Left ventricular endocardial border not optimally defined to evaluate regional wall motion. The left ventricular internal cavity size was mildly dilated. There is no left ventricular hypertrophy. Left ventricular diastolic parameters are consistent with Grade II diastolic dysfunction (pseudonormalization). Right Ventricle: The right  ventricular size is normal. No increase in right ventricular wall thickness. Right ventricular systolic function is normal. There is mildly elevated pulmonary artery systolic pressure. The tricuspid regurgitant velocity is 2.77  m/s, and with an assumed right atrial pressure of 8 mmHg, the estimated right ventricular systolic pressure is 38.7 mmHg. Left Atrium: Left atrial size was mildly dilated. Right Atrium: Right atrial size was normal in size. Pericardium: There is no evidence of pericardial effusion. Mitral Valve: The mitral valve is degenerative in appearance. There is mild thickening of the mitral valve leaflet(s). There is mild calcification of the mitral valve leaflet(s). Severe mitral valve regurgitation, with posteriorly-directed jet. No evidence of mitral valve stenosis. MV peak gradient, 10.8 mmHg. The mean mitral valve gradient is 4.0 mmHg. Tricuspid Valve: The tricuspid valve is normal in structure. Tricuspid valve regurgitation is mild . No evidence of tricuspid stenosis. Aortic Valve: The aortic valve is normal in structure. Aortic valve regurgitation is not visualized. Aortic valve sclerosis/calcification is present, without any evidence of aortic stenosis. Aortic valve mean gradient measures 2.0 mmHg. Aortic valve peak  gradient measures 4.1 mmHg. Aortic valve area, by VTI measures 2.36 cm. Pulmonic Valve: The pulmonic valve was normal in structure. Pulmonic valve regurgitation is not visualized. No evidence of pulmonic stenosis. Aorta: The aortic root is normal in size and structure. Venous: The inferior vena cava is normal in size with less than 50% respiratory variability, suggesting right atrial pressure of 8 mmHg. IAS/Shunts: No atrial level shunt detected by color flow Doppler.  LEFT VENTRICLE PLAX 2D LVIDd:         5.20 cm   Diastology LVIDs:         4.00 cm   LV e' medial:    6.20 cm/s LV PW:         0.90 cm   LV E/e' medial:  24.5 LV IVS:        1.00 cm   LV e' lateral:   12.50 cm/s  LVOT diam:     1.90 cm   LV E/e' lateral: 12.2 LV SV:         45 LV SV Index:   29 LVOT Area:     2.84 cm  LEFT ATRIUM             Index        RIGHT ATRIUM           Index LA diam:        4.10 cm 2.69 cm/m   RA Area:     14.30 cm LA Vol (A2C):   60.8 ml 39.91 ml/m  RA Volume:   37.80 ml  24.81 ml/m LA Vol (A4C):   47.8 ml 31.38 ml/m LA Biplane Vol: 56.6 ml 37.15 ml/m  AORTIC VALVE                    PULMONIC VALVE AV Area (Vmax):    2.44  cm     PV Vmax:       1.17 m/s AV Area (Vmean):   2.33 cm     PV Peak grad:  5.5 mmHg AV Area (VTI):     2.36 cm AV Vmax:           101.00 cm/s AV Vmean:          63.900 cm/s AV VTI:            0.190 m AV Peak Grad:      4.1 mmHg AV Mean Grad:      2.0 mmHg LVOT Vmax:         87.00 cm/s LVOT Vmean:        52.400 cm/s LVOT VTI:          0.158 m LVOT/AV VTI ratio: 0.83  AORTA Ao Root diam: 2.70 cm MITRAL VALVE                  TRICUSPID VALVE MV Area (PHT): 4.24 cm       TR Peak grad:   30.7 mmHg MV Area VTI:   1.38 cm       TR Vmax:        277.00 cm/s MV Peak grad:  10.8 mmHg MV Mean grad:  4.0 mmHg       SHUNTS MV Vmax:       1.64 m/s       Systemic VTI:  0.16 m MV Vmean:      93.4 cm/s      Systemic Diam: 1.90 cm MV Decel Time: 179 msec MR Peak grad:    79.6 mmHg MR Mean grad:    50.0 mmHg MR Vmax:         446.00 cm/s MR Vmean:        328.0 cm/s MR PISA:         2.26 cm MR PISA Eff ROA: 47 mm MR PISA Radius:  0.60 cm MV E velocity: 152.00 cm/s MV A velocity: 134.00 cm/s MV E/A ratio:  1.13 Lorine Bears MD Electronically signed by Lorine Bears MD Signature Date/Time: 03/26/2023/6:17:49 PM    Final     Scheduled Meds:  arformoterol  15 mcg Nebulization BID   bisoprolol  5 mg Oral Daily   clotrimazole  1 Applicatorful Vaginal QHS   cyanocobalamin  1,000 mcg Oral Daily   promethazine  6.25 mg Oral QHS   And   dextromethorphan  15 mg Oral QHS   feeding supplement (GLUCERNA SHAKE)  237 mL Oral TID BM   ferrous sulfate  325 mg Oral Q breakfast   gabapentin   100 mg Oral QHS   insulin aspart  0-9 Units Subcutaneous Q6H   insulin glargine-yfgn  4 Units Subcutaneous Daily   methylPREDNISolone (SOLU-MEDROL) injection  40 mg Intravenous Daily   multivitamin  1 tablet Oral BID   revefenacin  175 mcg Nebulization Daily   rosuvastatin  10 mg Oral Once per day on Mon Wed Fri Sat   sodium chloride  1 g Oral BID WC   Continuous Infusions:  azithromycin Stopped (03/27/23 1355)   cefTRIAXone (ROCEPHIN)  IV Stopped (03/27/23 1251)   heparin 650 Units/hr (03/28/23 0515)   sodium bicarbonate 150 mEq in dextrose 5 % 1,150 mL infusion 50 mL/hr at 03/28/23 0515     LOS: 3 days    Time spent: 50 mins    Silvano Bilis, MD [facility]   If 7PM-7AM, please contact night-coverage

## 2023-03-28 NOTE — ED Notes (Signed)
Advised nurse that patient has ready bed 

## 2023-03-28 NOTE — Progress Notes (Signed)
Rounding Note    Patient Name: Jill Hudson Date of Encounter: 03/28/2023  [facility] Cardiologist: None   Subjective   Feeling increasingly short of breath.  Requested to go back on BiPAP  Inpatient Medications    Scheduled Meds:  arformoterol  15 mcg Nebulization BID   bisoprolol  5 mg Oral Daily   clotrimazole  1 Applicatorful Vaginal QHS   cyanocobalamin  1,000 mcg Oral Daily   promethazine  6.25 mg Oral QHS   And   dextromethorphan  15 mg Oral QHS   feeding supplement (GLUCERNA SHAKE)  237 mL Oral TID BM   ferrous sulfate  325 mg Oral Q breakfast   gabapentin  100 mg Oral QHS   insulin aspart  0-9 Units Subcutaneous TID WC   insulin glargine-yfgn  10 Units Subcutaneous Daily   multivitamin  1 tablet Oral BID   revefenacin  175 mcg Nebulization Daily   rosuvastatin  10 mg Oral Once per day on Mon Wed Fri Sat   sodium chloride  1 g Oral BID WC   Continuous Infusions:  azithromycin Stopped (03/28/23 1449)   cefTRIAXone (ROCEPHIN)  IV Stopped (03/28/23 1331)   heparin 650 Units/hr (03/28/23 0515)   PRN Meds: acetaminophen, dextromethorphan-guaiFENesin, diphenhydrAMINE, hydrALAZINE, levalbuterol, melatonin, metoprolol tartrate, ondansetron (ZOFRAN) IV, oxyCODONE, senna-docusate, traZODone   Vital Signs    Vitals:   03/28/23 0921 03/28/23 1304 03/28/23 1330 03/28/23 1612  BP: 130/63  129/71   Pulse: 80  73   Resp: 16  (!) 24   Temp:  98 F (36.7 C)    TempSrc:  Oral    SpO2: 96%  93% 94%  Weight:      Height:        Intake/Output Summary (Last 24 hours) at 03/28/2023 1617 Last data filed at 03/28/2023 0515 Gross per 24 hour  Intake 1749.19 ml  Output --  Net 1749.19 ml      03/25/2023    9:31 AM 02/19/2023    1:42 PM 02/12/2023    9:41 AM  Last 3 Weights  Weight (lbs) 124 lb 122 lb 123 lb  Weight (kg) 56.246 kg 55.339 kg 55.792 kg      Telemetry    Sinus rhythm.  No events.- Personally Reviewed  ECG    Sinus tachycardia.  Rate 115  bpm.  Inferolateral ST depression - Personally Reviewed  Physical Exam   VS:  BP 129/71   Pulse 73   Temp 98 F (36.7 C) (Oral)   Resp (!) 24   Ht 5' (1.524 m)   Wt 56.2 kg   SpO2 94%   BMI 24.22 kg/m  , BMI Body mass index is 24.22 kg/m. GENERAL: Frail.  No acute distress HEENT: Pupils equal round and reactive, fundi not visualized, oral mucosa unremarkable NECK:  No jugular venous distention, waveform within normal limits, carotid upstroke brisk and symmetric, no bruits, no thyromegaly LUNGS: Diminished breath sounds. HEART:  RRR.  PMI not displaced or sustained,S1 and S2 within normal limits, no S3, no S4, no clicks, no rubs, no murmurs ABD:  Flat, positive bowel sounds normal in frequency in pitch, no bruits, no rebound, no guarding, no midline pulsatile mass, no hepatomegaly, no splenomegaly EXT:  2 plus pulses throughout, no edema, no cyanosis no clubbing SKIN:  No rashes no nodules NEURO:  Cranial nerves II through XII grossly intact, motor grossly intact throughout PSYCH:  Cognitively intact, oriented to person place and time   Labs  High Sensitivity Troponin:   Recent Labs  Lab 03/25/23 1857 03/25/23 2156 03/26/23 0444 03/26/23 1428 03/26/23 1711  TROPONINIHS 225* 458* 1,186* 1,165* 1,263*     Chemistry Recent Labs  Lab 03/25/23 1001 03/25/23 1318 03/26/23 1428 03/27/23 0437 03/28/23 0531  NA 127*   < > 125* 126* 125*  K 3.8   < > 4.7 4.4 3.9  CL 96*   < > 94* 97* 94*  CO2 20*   < > 16* 15* 18*  GLUCOSE 206*   < > 231* 151* 218*  BUN 34*   < > 55* 60* 76*  CREATININE 1.43*   < > 2.36* 2.59* 2.84*  CALCIUM 8.7*   < > 8.6* 8.4* 7.9*  MG  --   --   --  2.1 2.1  PROT 6.2*  --   --   --   --   ALBUMIN 3.5  --   --   --   --   AST 22  --   --   --   --   ALT 16  --   --   --   --   ALKPHOS 73  --   --   --   --   BILITOT 0.6  --   --   --   --   GFRNONAA 36*   < > 20* 18* 16*  ANIONGAP 11   < > 15 14 13    < > = values in this interval not  displayed.    Lipids  Recent Labs  Lab 03/26/23 0444  CHOL 117  TRIG 53  HDL 65  LDLCALC 41  CHOLHDL 1.8    Hematology Recent Labs  Lab 03/26/23 0444 03/27/23 0437 03/28/23 0531  WBC 20.7* 21.9* 15.2*  RBC 3.62* 3.26* 3.31*  HGB 10.3* 9.5* 9.4*  HCT 31.7* 28.8* 28.8*  MCV 87.6 88.3 87.0  MCH 28.5 29.1 28.4  MCHC 32.5 33.0 32.6  RDW 12.8 13.1 13.0  PLT 264 235 225   Thyroid No results for input(s): "TSH", "FREET4" in the last 168 hours.  BNP Recent Labs  Lab 03/25/23 1001  BNP 971.3*    DDimer No results for input(s): "DDIMER" in the last 168 hours.   Radiology    CT CHEST WO CONTRAST  Result Date: 03/28/2023 CLINICAL DATA:  88 year old female with shortness of breath. EXAM: CT CHEST WITHOUT CONTRAST TECHNIQUE: Multidetector CT imaging of the chest was performed following the standard protocol without IV contrast. RADIATION DOSE REDUCTION: This exam was performed according to the departmental dose-optimization program which includes automated exposure control, adjustment of the mA and/or kV according to patient size and/or use of iterative reconstruction technique. COMPARISON:  Portable chest 03/25/2023 and earlier. FINDINGS: Cardiovascular: Extensive Calcified aortic atherosclerosis. Heavily calcified aberrant origin of the right subclavian artery. Vascular patency is not evaluated in the absence of IV contrast. Mild cardiomegaly. No pericardial effusion. Calcified coronary artery plaque. Mediastinum/Nodes: Small reactive appearing mediastinal lymph nodes. No mediastinal mass. Lungs/Pleura: Moderate bilateral layering pleural effusions with simple fluid density suggesting transudate. Compressive lower lobe atelectasis. Superimposed moderate to severe centrilobular emphysema, most pronounced in the upper lobes. Major airways remain patent. No air bronchograms. Mild emphysema related architectural distortion including in the lingula. Upper Abdomen: Negative visible noncontrast  spleen, pancreas, adrenal glands, bowel. Occasional circumscribed and simple fluid density areas in the partially visible liver appear to be benign patent cysts (no follow-up imaging recommended). Similar exophytic left renal upper pole cyst suspected (no  follow-up imaging recommended). Renal vascular calcifications. Musculoskeletal: Severe chronic thoracolumbar junction disc and endplate degeneration in the setting of levoconvex scoliosis there. Degenerative vertebral body sclerosis. Vacuum disc. No acute or suspicious osseous lesion identified. IMPRESSION: 1. Moderate layering bilateral pleural effusions and lower lobe atelectasis superimposed on fairly advanced Emphysema (ICD10-J43.9). 2. Advanced  Aortic Atherosclerosis (ICD10-I70.0). 3. Spinal degeneration. Electronically Signed   By: Odessa Fleming M.D.   On: 03/28/2023 10:39    Cardiac Studies   Echo 03/26/23:    1. Left ventricular ejection fraction, by estimation, is 30 to 35%. The left ventricle has moderately decreased function. Left ventricular endocardial border not optimally defined to evaluate regional wall motion. The left ventricular internal cavity size was mildly dilated. Left ventricular diastolic parameters are consistent with Grade II diastolic dysfunction (pseudonormalization).  2. Right ventricular systolic function is normal. The right ventricular size is normal. There is mildly elevated pulmonary artery systolic pressure.  3. Left atrial size was mildly dilated.  4. The mitral valve is degenerative. Severe mitral valve regurgitation. No evidence of mitral stenosis.  5. The aortic valve is normal in structure. Aortic valve regurgitation is not visualized. Aortic valve sclerosis/calcification is present, without any evidence of aortic stenosis.  6. The inferior vena cava is normal in size with <50% respiratory variability, suggesting right atrial pressure of 8 mmHg.    Patient Profile     88 y.o. female with hypertension,  hyperlipidemia, COPD, CAD, PAD, CKD, PAF, CVA, diabetes, admitted with acute systolic diastolic heart failure.  Assessment & Plan    # Acute systolic and diastolic HF:  # Severe eccentric MR:  # Hyponatremia: Ms. Certo has newly diagnosed systolic heart failure.  LVEF 30-35% this admission.  She also has severe mitral regurgitation.  Ultimately she needs left and right heart catheterization.  She has hyponatremia.  She is been evaluated by nephrology due to this and her worsening renal function.  There is some concern that she is intravascular volume depleted.  She is gotten sodium bicarbonate and renal function continues to worsen.  Concerned that it may be due to hypervolemia and low output heart failure.  I have asked the primary team to help arrange thoracentesis and the patient and her daughter are in agreement.  We will try to get her set up for right heart catheterization tomorrow if her respiratory status is stable enough.  She needs to be seen by the advanced heart failure service.  She also needs TEE to better evaluate her mitral valve once more medically stable.  Unfortunately, she has worsening renal function and is not a candidate for left heart catheterization at this time.  Agree with goals of care conversation.  Recommend heart failure team evaluation next week.  Bisoprolol was started this admission.  Her home amlodipine was held.  If renal function does not improve with holding diuretics, we need may need to stop her beta-blocker and consider inotropes in order to diurese.  Strict I/O and daily weights.   # CAD:  # PAD:  # Hyperlipidemia: Continue rosuvastatin.  Ischemic evaluation when able.  # PAF:  Currently in sinus rhythm.  Her home Eliquis is being held and she is on a heparin drip until we determine what procedures need to be performed.  # Acute on chronic renal failure.   Holding Lasix as above.     For questions or updates, please contact [facility] Please  consult [email or web address] for contact info under  Signed, Chilton Si, MD  03/28/2023, 4:17 PM

## 2023-03-29 ENCOUNTER — Inpatient Hospital Stay: Payer: Medicare HMO

## 2023-03-29 DIAGNOSIS — N183 Chronic kidney disease, stage 3 unspecified: Secondary | ICD-10-CM

## 2023-03-29 DIAGNOSIS — I480 Paroxysmal atrial fibrillation: Secondary | ICD-10-CM | POA: Diagnosis not present

## 2023-03-29 DIAGNOSIS — I129 Hypertensive chronic kidney disease with stage 1 through stage 4 chronic kidney disease, or unspecified chronic kidney disease: Secondary | ICD-10-CM | POA: Diagnosis not present

## 2023-03-29 DIAGNOSIS — N179 Acute kidney failure, unspecified: Secondary | ICD-10-CM | POA: Diagnosis not present

## 2023-03-29 DIAGNOSIS — I214 Non-ST elevation (NSTEMI) myocardial infarction: Secondary | ICD-10-CM | POA: Diagnosis not present

## 2023-03-29 DIAGNOSIS — J9601 Acute respiratory failure with hypoxia: Secondary | ICD-10-CM | POA: Diagnosis not present

## 2023-03-29 DIAGNOSIS — J90 Pleural effusion, not elsewhere classified: Secondary | ICD-10-CM | POA: Diagnosis not present

## 2023-03-29 DIAGNOSIS — N1832 Chronic kidney disease, stage 3b: Secondary | ICD-10-CM | POA: Diagnosis not present

## 2023-03-29 DIAGNOSIS — E871 Hypo-osmolality and hyponatremia: Secondary | ICD-10-CM | POA: Diagnosis not present

## 2023-03-29 DIAGNOSIS — I509 Heart failure, unspecified: Secondary | ICD-10-CM | POA: Diagnosis not present

## 2023-03-29 DIAGNOSIS — J441 Chronic obstructive pulmonary disease with (acute) exacerbation: Secondary | ICD-10-CM | POA: Diagnosis not present

## 2023-03-29 DIAGNOSIS — I5021 Acute systolic (congestive) heart failure: Secondary | ICD-10-CM | POA: Diagnosis not present

## 2023-03-29 LAB — BASIC METABOLIC PANEL
Anion gap: 11 (ref 5–15)
Anion gap: 15 (ref 5–15)
BUN: 87 mg/dL — ABNORMAL HIGH (ref 8–23)
BUN: 92 mg/dL — ABNORMAL HIGH (ref 8–23)
CO2: 21 mmol/L — ABNORMAL LOW (ref 22–32)
CO2: 19 mmol/L — ABNORMAL LOW (ref 22–32)
Calcium: 7.7 mg/dL — ABNORMAL LOW (ref 8.9–10.3)
Calcium: 7.8 mg/dL — ABNORMAL LOW (ref 8.9–10.3)
Chloride: 95 mmol/L — ABNORMAL LOW (ref 98–111)
Chloride: 93 mmol/L — ABNORMAL LOW (ref 98–111)
Creatinine, Ser: 2.89 mg/dL — ABNORMAL HIGH (ref 0.44–1.00)
Creatinine, Ser: 2.79 mg/dL — ABNORMAL HIGH (ref 0.44–1.00)
GFR, Estimated: 15 mL/min — ABNORMAL LOW (ref >60)
GFR, Estimated: 16 mL/min — ABNORMAL LOW (ref >60)
Glucose, Bld: 118 mg/dL — ABNORMAL HIGH (ref 70–99)
Glucose, Bld: 241 mg/dL — ABNORMAL HIGH (ref 70–99)
Potassium: 3.7 mmol/L (ref 3.5–5.1)
Potassium: 4 mmol/L (ref 3.5–5.1)
Sodium: 127 mmol/L — ABNORMAL LOW (ref 135–145)
Sodium: 127 mmol/L — ABNORMAL LOW (ref 135–145)

## 2023-03-29 LAB — GLUCOSE, PLEURAL OR PERITONEAL FLUID: Glucose, Fluid: 126 mg/dL

## 2023-03-29 LAB — RESPIRATORY PANEL BY PCR

## 2023-03-29 LAB — CULTURE, BLOOD (ROUTINE X 2): Culture: NO GROWTH

## 2023-03-29 LAB — PROTEIN, PLEURAL OR PERITONEAL FLUID: Total protein, fluid: <3 g/dL

## 2023-03-29 LAB — COOXEMETRY PANEL
Carboxyhemoglobin: 1.2 % (ref 0.5–1.5)
Carboxyhemoglobin: 0.7 % (ref 0.5–1.5)
Methemoglobin: 1.2 % (ref 0.0–1.5)
Methemoglobin: 0.9 % (ref 0.0–1.5)
O2 Saturation: 93.5 %
O2 Saturation: 44.3 %
Total hemoglobin: 9.9 g/dL — ABNORMAL LOW (ref 12.0–16.0)
Total hemoglobin: 8.8 g/dL — ABNORMAL LOW (ref 12.0–16.0)
Total oxygen content: 91.3 %
Total oxygen content: 43.6 %

## 2023-03-29 LAB — CBC
HCT: 29.4 % — ABNORMAL LOW (ref 36.0–46.0)
HCT: 27.8 % — ABNORMAL LOW (ref 36.0–46.0)
Hemoglobin: 9.7 g/dL — ABNORMAL LOW (ref 12.0–15.0)
Hemoglobin: 9.3 g/dL — ABNORMAL LOW (ref 12.0–15.0)
MCH: 28.4 pg (ref 26.0–34.0)
MCH: 29 pg (ref 26.0–34.0)
MCHC: 33 g/dL (ref 30.0–36.0)
MCHC: 33.5 g/dL (ref 30.0–36.0)
MCV: 86.2 fL (ref 80.0–100.0)
MCV: 86.6 fL (ref 80.0–100.0)
Platelets: 242 10*3/uL (ref 150–400)
Platelets: 228 10*3/uL (ref 150–400)
RBC: 3.41 MIL/uL — ABNORMAL LOW (ref 3.87–5.11)
RBC: 3.21 MIL/uL — ABNORMAL LOW (ref 3.87–5.11)
RDW: 13 % (ref 11.5–15.5)
RDW: 13.1 % (ref 11.5–15.5)
WBC: 15.6 10*3/uL — ABNORMAL HIGH (ref 4.0–10.5)
WBC: 11.9 10*3/uL — ABNORMAL HIGH (ref 4.0–10.5)
nRBC: 0 % (ref 0.0–0.2)
nRBC: 0.2 % (ref 0.0–0.2)

## 2023-03-29 LAB — LACTATE DEHYDROGENASE, PLEURAL OR PERITONEAL FLUID: LD, Fluid: 57 U/L — ABNORMAL HIGH (ref 3–23)

## 2023-03-29 LAB — APTT
aPTT: 58 seconds — ABNORMAL HIGH (ref 24–36)
aPTT: 40 seconds — ABNORMAL HIGH (ref 24–36)

## 2023-03-29 LAB — PROTEIN, TOTAL: Total Protein: 5.3 g/dL — ABNORMAL LOW (ref 6.5–8.1)

## 2023-03-29 LAB — GLUCOSE, CAPILLARY
Glucose-Capillary: 103 mg/dL — ABNORMAL HIGH (ref 70–99)
Glucose-Capillary: 108 mg/dL — ABNORMAL HIGH (ref 70–99)
Glucose-Capillary: 138 mg/dL — ABNORMAL HIGH (ref 70–99)
Glucose-Capillary: 145 mg/dL — ABNORMAL HIGH (ref 70–99)
Glucose-Capillary: 226 mg/dL — ABNORMAL HIGH (ref 70–99)
Glucose-Capillary: 85 mg/dL (ref 70–99)

## 2023-03-29 LAB — LACTATE DEHYDROGENASE: LDH: 519 U/L — ABNORMAL HIGH (ref 98–192)

## 2023-03-29 LAB — BODY FLUID CULTURE W GRAM STAIN

## 2023-03-29 LAB — HEPARIN LEVEL (UNFRACTIONATED)
Heparin Unfractionated: 0.68 IU/mL (ref 0.30–0.70)
Heparin Unfractionated: 0.94 IU/mL — ABNORMAL HIGH (ref 0.30–0.70)

## 2023-03-29 LAB — MRSA NEXT GEN BY PCR, NASAL: MRSA by PCR Next Gen: NOT DETECTED

## 2023-03-29 MED ORDER — LIDOCAINE HCL 1 % IJ SOLN
INTRAMUSCULAR | Status: AC
  Administered 2023-03-29: 3 mL
  Filled 2023-03-29: qty 10

## 2023-03-29 MED ORDER — LIDOCAINE HCL (PF) 1 % IJ SOLN
10.0000 mL | Freq: Once | INTRAMUSCULAR | Status: AC
  Administered 2023-03-29: 10 mL via INTRADERMAL

## 2023-03-29 MED ORDER — ORAL CARE MOUTH RINSE: 15.0000 mL | OROMUCOSAL | Status: DC | PRN

## 2023-03-29 MED ORDER — MILRINONE LACTATE IN DEXTROSE 20-5 MG/100ML-% IV SOLN
0.1250 ug/kg/min | INTRAVENOUS | Status: DC
  Administered 2023-03-29 – 2023-03-30 (×2): 0.25 ug/kg/min via INTRAVENOUS
  Administered 2023-03-31 – 2023-04-02 (×2): 0.125 ug/kg/min via INTRAVENOUS
  Filled 2023-03-29 (×5): qty 100

## 2023-03-29 MED ORDER — INSULIN GLARGINE-YFGN 100 UNIT/ML ~~LOC~~ SOLN
7.0000 [IU] | Freq: Every day | SUBCUTANEOUS | Status: DC
  Filled 2023-03-29: qty 0.07

## 2023-03-29 MED ORDER — ALUM & MAG HYDROXIDE-SIMETH 200-200-20 MG/5ML PO SUSP
30.0000 mL | ORAL | Status: DC | PRN
  Administered 2023-03-29: 30 mL via ORAL
  Filled 2023-03-29: qty 30

## 2023-03-29 MED ORDER — AEROCHAMBER PLUS FLO-VU MISC
1.0000 | Freq: Once | Status: DC
  Filled 2023-03-29: qty 1

## 2023-03-29 MED ORDER — FUROSEMIDE 10 MG/ML IJ SOLN
20.0000 mg/h | INTRAVENOUS | Status: DC
  Administered 2023-03-29 – 2023-03-31 (×4): 20 mg/h via INTRAVENOUS
  Filled 2023-03-29 (×6): qty 20

## 2023-03-29 MED ORDER — HEPARIN BOLUS VIA INFUSION
800.0000 [IU] | Freq: Once | INTRAVENOUS | Status: AC
  Administered 2023-03-29: 800 [IU] via INTRAVENOUS
  Filled 2023-03-29: qty 800

## 2023-03-29 MED ORDER — HEPARIN BOLUS VIA INFUSION
1600.0000 [IU] | Freq: Once | INTRAVENOUS | Status: AC
  Administered 2023-03-29: 1600 [IU] via INTRAVENOUS
  Filled 2023-03-29: qty 1600

## 2023-03-29 MED ORDER — FUROSEMIDE 10 MG/ML IJ SOLN
120.0000 mg | Freq: Two times a day (BID) | INTRAVENOUS | Status: DC
  Administered 2023-03-29: 120 mg via INTRAVENOUS
  Filled 2023-03-29: qty 12

## 2023-03-29 MED ORDER — LIDOCAINE HCL 1 % IJ SOLN
INTRAMUSCULAR | Status: AC
  Filled 2023-03-29: qty 10

## 2023-03-29 MED ORDER — NOREPINEPHRINE 4 MG/250ML-% IV SOLN: 0.0000 ug/min | INTRAVENOUS | Status: DC

## 2023-03-29 NOTE — Procedures (Signed)
PROCEDURE SUMMARY:  Successful image-guided right thoracentesis. Yielded 700 mL of pale yellow fluid. Pt tolerated procedure well. No immediate complications. EBL = trace   Specimen was sent for labs. CXR ordered.  Please see imaging section of Epic for full dictation.  Kennieth Francois PA-C 03/29/2023 10:30 AM

## 2023-03-29 NOTE — Progress Notes (Signed)
ANTICOAGULATION CONSULT NOTE  Pharmacy Consult for heparin infusion Indication: ACS/STEMI/ apixaban for Afib PTA  Allergies  Allergen Reactions   Cefdinir Diarrhea   Saxagliptin Diarrhea   Epinephrine Other (See Comments)    Patient does not remember what happens when she uses this   Evolocumab     Pain with injection   Atorvastatin Other (See Comments)    Muscle aches   Codeine Other (See Comments)    Upset stomach   Ezetimibe Other (See Comments)    Myalgias(ZETIA)   Limonene Rash    Patient does not recall this reaction   Nitrofurantoin Rash and Other (See Comments)    Pruitus   Sulfa Antibiotics Rash and Other (See Comments)    Sore mouth     Patient Measurements: Height: 5' (152.4 cm) Weight: 61.3 kg (135 lb 2.3 oz) IBW/kg (Calculated) : 45.5 Heparin Dosing Weight: 56.2 kg  Vital Signs: Temp: 97.7 F (36.5 C) (06/03 1600) Temp Source: Oral (06/03 1600) BP: 108/56 (06/03 1600) Pulse Rate: 69 (06/03 1600)  Labs: Recent Labs    03/26/23 1711 03/26/23 2228 03/27/23 0437 03/27/23 0802 03/27/23 1358 03/28/23 0531 03/29/23 0412 03/29/23 1612  HGB  --    < > 9.5*  --   --  9.4* 9.7*  --   HCT  --   --  28.8*  --   --  28.8* 29.4*  --   PLT  --   --  235  --   --  225 242  --   APTT  --    < >  --  68*   < > 82* 58* 40*  HEPARINUNFRC  --   --   --  >1.10*  --  >1.10* 0.68  --   CREATININE  --   --  2.59*  --   --  2.84* 2.89*  --   TROPONINIHS 1,263*  --   --   --   --   --   --   --    < > = values in this interval not displayed.     Estimated Creatinine Clearance: 11.4 mL/min (A) (by C-G formula based on SCr of 2.89 mg/dL (H)).   Medical History: Past Medical History:  Diagnosis Date   Acute CHF (congestive heart failure) (HCC) 02/18/2020   Acute diastolic CHF (congestive heart failure) (HCC)    Acute kidney injury superimposed on CKD (HCC)    Allergies    Anxiety    Arthritis    spine and shoulder   Atherosclerosis of artery of extremity with  rest pain (HCC) 10/12/2018   Cancer (HCC)    skin   CHF (congestive heart failure) (HCC)    Diabetes mellitus without complication (HCC)    Diverticulosis of large intestine with hemorrhage    GI bleeding 12/25/2019   Heart murmur    Hyperlipidemia    Hypertension    Macula lutea degeneration    Mitral and aortic valve disease    Myocardial infarction (HCC)    may have had a "light" heart attack   Non-ST elevation (NSTEMI) myocardial infarction (HCC)    Occasional tremors    PAD (peripheral artery disease) (HCC)    Rectal bleeding    Shingles    patient unaware but daughter confirms. it was a long time ago   Stroke [facility]) 01/2017   may have had a slight stroke   TIA (transient ischemic attack) 01/2017   UTI (urinary tract infection)    Vascular  disease, peripheral (HCC)     Medications:  PTA Meds: Apixaban 2.5 mg BID, last dose 5/30 @ 1856  Assessment: Pt is a 88 yo female initially presenting to ED w/ COPD exacerbation, now c/o increase WOB , found with increasing Troponin I levels.  5/31 1428 aPTT=95 Therapeutic x1 5/31 2228 aPTT=120, supratherapeutic 6/1   0802 aPTT=68/HL>1.10   aPTT therapeuticx1 6/1   1358 aPTT= 89  Therapeuticx2 6/2   0531 aPTT = 82, therapeutic x 3 / HL > 1.1, not correlating 6/3   0412 aPTT = 58, subtherapeutic / HL 0.68, not correlating 6/4   1612 aPTT = 40, subtherapeutic   Goal of Therapy:  Heparin level 0.3-0.7 units/ml aPTT 66-102 seconds Monitor platelets by anticoagulation protocol: Yes   Plan:  Bolus 1600 units x 1 Increase heparin infusion to 950 units/hr Will follow aPTT until correlation w/ HL confirmed.  Will check aPTT in 8 hrs after rate change HL & CBC daily while on heparin  Bettey Costa, PharmD Clinical Pharmacist 03/29/2023 5:08 PM

## 2023-03-29 NOTE — Progress Notes (Signed)
Occupational Therapy Evaluation Patient Details Name: Jill Hudson MRN: [id number] DOB: 05/11/1935 Today's Date: 03/29/2023   History of Present Illness presented to ER secondary to progressive SOB; admitted for management of A/C CHF exacerbation, AECOPD.  s/p thoracentesis (6/3) with removal of fluid; pending cardiac cath and TEE   Clinical Impression   Jill Hudson was seen for OT evaluation this date. Prior to hospital admission, pt was living with daughter and granddaughter at home, MOD I to MIN A. Pt lives on first level of home with bathroom and living areas accessible by walker. Pt reported that family are available 24/7 to assist with ADLs. At baseline pt reports using a rollator and requires assistance with ADLs due to chronic fatigue. Pt presents to acute OT demonstrating impaired ADL performance and functional mobility 2/2 decreased activity tolerance, generalized weakness, and limited stability (See OT problem list for additional functional deficits). Pt currently requires MIN A for transferring from chair <> bed. MIN A for bed mobility. MIN A for pericare. Pt would benefit from skilled OT services to address noted impairments and functional limitations (see below for any additional details) in order to maximize safety and independence while minimizing falls risk and caregiver burden.       Recommendations for follow up therapy are one component of a multi-disciplinary discharge planning process, led by the attending physician.  Recommendations may be updated based on patient status, additional functional criteria and insurance authorization.   Assistance Recommended at Discharge Intermittent Supervision/Assistance  Patient can return home with the following A little help with walking and/or transfers;A little help with bathing/dressing/bathroom;Assistance with cooking/housework;Assist for transportation;Help with stairs or ramp for entrance    Functional Status Assessment  Patient  has had a recent decline in their functional status and demonstrates the ability to make significant improvements in function in a reasonable and predictable amount of time.  Equipment Recommendations  Other (comment) (Defer)    Recommendations for Other Services       Precautions / Restrictions Precautions Precautions: Fall Restrictions Weight Bearing Restrictions: No      Mobility Bed Mobility Overal bed mobility: Needs Assistance Bed Mobility: Sit to Supine       Sit to supine: Min assist   General bed mobility comments: LE managment    Transfers Overall transfer level: Needs assistance Equipment used: Rolling walker (2 wheels), 1 person hand held assist Transfers: Bed to chair/wheelchair/BSC, Sit to/from Stand             General transfer comment: Pt was able to stand with CGA with RW, and MIN A without RW.      Balance Overall balance assessment: Needs assistance Sitting-balance support: No upper extremity supported, Feet supported Sitting balance-Leahy Scale: Good     Standing balance support: Single extremity supported Standing balance-Leahy Scale: Poor Standing balance comment: Hand held assistance or RW to maintain balance while standing.                           ADL either performed or assessed with clinical judgement   ADL Overall ADL's : Needs assistance/impaired                               Toileting - Clothing Manipulation Details (indicate cue type and reason): MIN A for pericare while standing     Functional mobility during ADLs: Min guard;Minimal assistance General ADL Comments: Anticipate CGA-MIN  A for completion of ADLs     Vision         Perception     Praxis      Pertinent Vitals/Pain Pain Assessment Pain Assessment: No/denies pain     Hand Dominance Right   Extremity/Trunk Assessment Upper Extremity Assessment Upper Extremity Assessment: Overall WFL for tasks assessed   Lower Extremity  Assessment Lower Extremity Assessment: Overall WFL for tasks assessed (grossly 4/5 throughout; no focal weakness)       Communication Communication Communication: No difficulties   Cognition Arousal/Alertness: Awake/alert Behavior During Therapy: WFL for tasks assessed/performed Overall Cognitive Status: Within Functional Limits for tasks assessed                                       General Comments       Exercises     Shoulder Instructions      Home Living Family/patient expects to be discharged to:: Private residence Living Arrangements: Children Available Help at Discharge: Family Type of Home: House Home Access: Stairs to enter Secretary/administrator of Steps: 4 Entrance Stairs-Rails: Right;Left Home Layout: Able to live on main level with bedroom/bathroom     Bathroom Shower/Tub: Chief Strategy Officer: Standard Bathroom Accessibility: Yes How Accessible: Accessible via walker Home Equipment: Rollator (4 wheels)   Additional Comments: Pt has been living on 1st floor for a few years. Pt lives with duaghter and granddaughter.      Prior Functioning/Environment Prior Level of Function : Independent/Modified Independent             Mobility Comments: Mod indep with 4WRW for ADLs, household and community mobilization; denies fall history; no home O2. ADLs Comments: Pt reports that daughter assists with dressing and bathing due to frequent onsets of fatgue.        OT Problem List: Decreased strength;Decreased activity tolerance;Impaired balance (sitting and/or standing);Decreased knowledge of use of DME or AE;Decreased knowledge of precautions      OT Treatment/Interventions: Self-care/ADL training;Therapeutic exercise;Energy conservation;DME and/or AE instruction;Therapeutic activities;Patient/family education    OT Goals(Current goals can be found in the care plan section) Acute Rehab OT Goals Patient Stated Goal: to be  steady on my feet OT Goal Formulation: With patient Time For Goal Achievement: 04/12/23 Potential to Achieve Goals: Good ADL Goals Pt Will Perform Grooming: with modified independence;sitting Pt Will Perform Lower Body Dressing: sitting/lateral leans;with min assist Pt Will Perform Toileting - Clothing Manipulation and hygiene: with supervision;sit to/from stand  OT Frequency: Min 2X/week    Co-evaluation              AM-PAC OT "6 Clicks" Daily Activity     Outcome Measure Help from another person eating meals?: None Help from another person taking care of personal grooming?: A Little Help from another person toileting, which includes using toliet, bedpan, or urinal?: A Little Help from another person bathing (including washing, rinsing, drying)?: A Lot Help from another person to put on and taking off regular upper body clothing?: A Little Help from another person to put on and taking off regular lower body clothing?: A Lot 6 Click Score: 17   End of Session Equipment Utilized During Treatment: Rolling walker (2 wheels);Oxygen  Activity Tolerance: Patient limited by fatigue Patient left: in bed;with call bell/phone within reach;with bed alarm set  OT Visit Diagnosis: Unsteadiness on feet (R26.81);Muscle weakness (generalized) (M62.81);History of falling (Z91.81)  Time: 4098-1191 OT Time Calculation (min): 39 min Charges:  OT General Charges $OT Visit: 1 Visit OT Evaluation $OT Eval Moderate Complexity: 1 Mod OT Treatments $Self Care/Home Management : 8-22 mins  Thresa Ross, OTS

## 2023-03-29 NOTE — Procedures (Signed)
Central Venous Catheter Insertion Procedure Note  Jill Hudson  [id number]  [birth year]  Date:03/29/23  Time:4:05 PM   Provider Performing:Jackolyn Geron Earnest Conroy   Procedure: Insertion of Non-tunneled Central Venous [phone]) with US guidance ([id number])   Indication(s) Medication administration  Consent Risks of the procedure as well as the alternatives and risks of each were explained to the patient and/or caregiver.  Consent for the procedure was obtained and is signed in the bedside chart  Anesthesia Topical only with 1% lidocaine   Timeout Verified patient identification, verified procedure, site/side was marked, verified correct patient position, special equipment/implants available, medications/allergies/relevant history reviewed, required imaging and test results available.  Sterile Technique Maximal sterile technique including full sterile barrier drape, hand hygiene, sterile gown, sterile gloves, mask, hair covering, sterile ultrasound probe cover (if used).  Procedure Description Area of catheter insertion was cleaned with chlorhexidine and draped in sterile fashion.  With real-time ultrasound guidance a central venous catheter was placed into the left internal jugular vein. Nonpulsatile blood flow and easy flushing noted in all ports.  The catheter was sutured in place and sterile dressing applied.  Complications/Tolerance None; patient tolerated the procedure well. Chest X-ray is ordered to verify placement for internal jugular or subclavian cannulation.   Chest x-ray is not ordered for femoral cannulation.  EBL Minimal  Specimen(s) None  Zada Girt, AGNP  Pulmonary/Critical Care Pager [phone] (please enter 7 digits) PCCM Consult Pager [phone] (please enter 7 digits)

## 2023-03-29 NOTE — Evaluation (Signed)
Physical Therapy Evaluation Patient Details Name: Jill Hudson MRN: [id number] DOB: 06/11/1935 Today's Date: 03/29/2023  History of Present Illness  presented to ER secondary to progressive SOB; admitted for management of A/C CHF exacerbation, AECOPD.  s/p thoracentesis (6/3) with removal of fluid; pending cardiac cath and TEE  Clinical Impression  Patient resting in bed upon arrival to room; alert and oriented, follows commands.  Eager for OOB activities; per attending physician, cleared for initiation of evaluation/OOB activities this date.  Denies pain; does endorse some improvement in respiratory status.  Bilat UE/LE strength and ROM grossly symmetrical and WFL; no focal weakness appreciated.  Able to complete bed mobility with supervision; sit/stand, basic transfers and bed/chair transfer with RW, min assist.  Requires cuing for hand placement; notably SOB with minimal exertion, sats >90% on 3L (additional activity deferred as result).  Generally weak and deconditioned; unsafe to attempt without RW and +1 at this time. Will continue to assess/progress as medically appropriate, closely monitoring cardiac/respiratory response to treatment. Would benefit from skilled PT to address above deficits and promote optimal return to PLOF.; recommend post-acute PT follow up as indicated by interdisciplinary care team.         Recommendations for follow up therapy are one component of a multi-disciplinary discharge planning process, led by the attending physician.  Recommendations may be updated based on patient status, additional functional criteria and insurance authorization.  Follow Up Recommendations Can patient physically be transported by private vehicle: Yes     Assistance Recommended at Discharge Frequent or constant Supervision/Assistance  Patient can return home with the following  A little help with walking and/or transfers;A little help with bathing/dressing/bathroom    Equipment  Recommendations    Recommendations for Other Services       Functional Status Assessment Patient has had a recent decline in their functional status and demonstrates the ability to make significant improvements in function in a reasonable and predictable amount of time.     Precautions / Restrictions Precautions Precautions: Fall Restrictions Weight Bearing Restrictions: No      Mobility  Bed Mobility Overal bed mobility: Needs Assistance Bed Mobility: Supine to Sit     Supine to sit: Supervision          Transfers Overall transfer level: Needs assistance Equipment used: Rolling walker (2 wheels) Transfers: Sit to/from Stand, Bed to chair/wheelchair/BSC Sit to Stand: Min assist Stand pivot transfers: Min assist         General transfer comment: cuing for hand placement; notably SOB with minimal exertion, sats >90% on 3L (additional activity deferred as result).  Generally weak and deconditioned; unsafe to attempt without RW and +1 at this time.    Ambulation/Gait               General Gait Details: deferred due to SOB/fatigue  Stairs            Wheelchair Mobility    Modified Rankin (Stroke Patients Only)       Balance Overall balance assessment: Needs assistance Sitting-balance support: No upper extremity supported, Feet supported Sitting balance-Leahy Scale: Good     Standing balance support: Bilateral upper extremity supported Standing balance-Leahy Scale: Fair                               Pertinent Vitals/Pain Pain Assessment Pain Assessment: No/denies pain    Home Living Family/patient expects to be discharged to:: Private residence Living  Arrangements: Children Available Help at Discharge: Family Type of Home: House Home Access: Stairs to enter Entrance Stairs-Rails: Doctor, general practice of Steps: 4   Home Layout: Able to live on main level with bedroom/bathroom Home Equipment: Rollator (4  wheels) Additional Comments: Pt has been living on 1st floor for a few years. Pt lives with duaghter and granddaughter.    Prior Function Prior Level of Function : Independent/Modified Independent             Mobility Comments: Mod indep with 4WRW for ADLs, household and community mobilization; denies fall history; no home O2. ADLs Comments: Pt reports that daughter assists with dressing and bathing due to frequent onsets of fatgue.     Hand Dominance   Dominant Hand: Right    Extremity/Trunk Assessment   Upper Extremity Assessment Upper Extremity Assessment: Overall WFL for tasks assessed    Lower Extremity Assessment Lower Extremity Assessment: Overall WFL for tasks assessed (grossly 4/5 throughout; no focal weakness)       Communication   Communication: No difficulties  Cognition Arousal/Alertness: Awake/alert Behavior During Therapy: WFL for tasks assessed/performed Overall Cognitive Status: Within Functional Limits for tasks assessed                                          General Comments      Exercises     Assessment/Plan    PT Assessment Patient needs continued PT services  PT Problem List Decreased activity tolerance;Decreased balance;Decreased mobility;Cardiopulmonary status limiting activity;Decreased knowledge of use of DME;Decreased safety awareness;Decreased knowledge of precautions       PT Treatment Interventions DME instruction;Gait training;Stair training;Functional mobility training;Therapeutic activities;Therapeutic exercise;Balance training;Patient/family education    PT Goals (Current goals can be found in the Care Plan section)  Acute Rehab PT Goals Patient Stated Goal: to get stronger and go home PT Goal Formulation: With patient Time For Goal Achievement: 04/12/23 Potential to Achieve Goals: Good    Frequency Min 3X/week     Co-evaluation               AM-PAC PT "6 Clicks" Mobility  Outcome Measure  Help needed turning from your back to your side while in a flat bed without using bedrails?: None Help needed moving from lying on your back to sitting on the side of a flat bed without using bedrails?: None Help needed moving to and from a bed to a chair (including a wheelchair)?: A Little Help needed standing up from a chair using your arms (e.g., wheelchair or bedside chair)?: A Little Help needed to walk in hospital room?: A Lot Help needed climbing 3-5 steps with a railing? : A Lot 6 Click Score: 18    End of Session Equipment Utilized During Treatment: Gait belt;Oxygen Activity Tolerance: Patient tolerated treatment well Patient left: in chair;with call bell/phone within reach;with chair alarm set Nurse Communication: Mobility status PT Visit Diagnosis: Muscle weakness (generalized) (M62.81);Difficulty in walking, not elsewhere classified (R26.2)    Time: 1610-9604 PT Time Calculation (min) (ACUTE ONLY): 22 min   Charges:   PT Evaluation $PT Eval Moderate Complexity: 1 Mod     Maricella Filyaw H. Manson Passey, PT, DPT, NCS 03/29/23, 3:29 PM [phone]

## 2023-03-29 NOTE — Progress Notes (Signed)
ANTICOAGULATION CONSULT NOTE  Pharmacy Consult for heparin infusion Indication: ACS/STEMI/ apixaban for Afib PTA  Allergies  Allergen Reactions   Cefdinir Diarrhea   Saxagliptin Diarrhea   Epinephrine Other (See Comments)    Patient does not remember what happens when she uses this   Evolocumab     Pain with injection   Atorvastatin Other (See Comments)    Muscle aches   Codeine Other (See Comments)    Upset stomach   Ezetimibe Other (See Comments)    Myalgias(ZETIA)   Limonene Rash    Patient does not recall this reaction   Nitrofurantoin Rash and Other (See Comments)    Pruitus   Sulfa Antibiotics Rash and Other (See Comments)    Sore mouth     Patient Measurements: Height: 5' (152.4 cm) Weight: 56.2 kg (124 lb) IBW/kg (Calculated) : 45.5 Heparin Dosing Weight: 56.2 kg  Vital Signs: Temp: 97.7 F (36.5 C) (06/03 0000) Temp Source: Oral (06/03 0000) BP: 129/61 (06/03 0136) Pulse Rate: 74 (06/03 0136)  Labs: Recent Labs    03/26/23 0700 03/26/23 0700 03/26/23 1428 03/26/23 1711 03/26/23 2228 03/27/23 0437 03/27/23 0802 03/27/23 1358 03/28/23 0531 03/29/23 0412  HGB  --   --   --   --    < > 9.5*  --   --  9.4* 9.7*  HCT  --   --   --   --   --  28.8*  --   --  28.8* 29.4*  PLT  --   --   --   --   --  235  --   --  225 242  APTT 36  --  95*  --    < >  --  68* 89* 82* 58*  LABPROT 17.5*  --   --   --   --   --   --   --   --   --   INR 1.4*  --   --   --   --   --   --   --   --   --   HEPARINUNFRC  --   --   --   --   --   --  >1.10*  --  >1.10* 0.68  CREATININE  --    < > 2.36*  --   --  2.59*  --   --  2.84* 2.89*  TROPONINIHS  --   --  1,165* 1,263*  --   --   --   --   --   --    < > = values in this interval not displayed.     Estimated Creatinine Clearance: 11 mL/min (A) (by C-G formula based on SCr of 2.89 mg/dL (H)).   Medical History: Past Medical History:  Diagnosis Date   Acute CHF (congestive heart failure) (HCC) 02/18/2020   Acute  diastolic CHF (congestive heart failure) (HCC)    Acute kidney injury superimposed on CKD (HCC)    Allergies    Anxiety    Arthritis    spine and shoulder   Atherosclerosis of artery of extremity with rest pain (HCC) 10/12/2018   Cancer (HCC)    skin   CHF (congestive heart failure) (HCC)    Diabetes mellitus without complication (HCC)    Diverticulosis of large intestine with hemorrhage    GI bleeding 12/25/2019   Heart murmur    Hyperlipidemia    Hypertension    Macula lutea degeneration  Mitral and aortic valve disease    Myocardial infarction (HCC)    may have had a "light" heart attack   Non-ST elevation (NSTEMI) myocardial infarction (HCC)    Occasional tremors    PAD (peripheral artery disease) (HCC)    Rectal bleeding    Shingles    patient unaware but daughter confirms. it was a long time ago   Stroke [facility]) 01/2017   may have had a slight stroke   TIA (transient ischemic attack) 01/2017   UTI (urinary tract infection)    Vascular disease, peripheral (HCC)     Medications:  PTA Meds: Apixaban 2.5 mg BID, last dose 5/30 @ 1856  Assessment: Pt is a 88 yo female initially presenting to ED w/ COPD exacerbation, now c/o increase WOB , found with increasing Troponin I levels.  5/31 1428 aPTT=95 Therapeutic x1 5/31 2228 aPTT=120, supratherapeutic 6/1   0802 aPTT=68/HL>1.10   aPTT therapeuticx1 6/1   1358 aPTT= 89  Therapeuticx2 6/2   0531 aPTT = 82, therapeutic x 3 / HL > 1.1, not correlating 6/3   0412 aPTT = 58, subtherapeutic / HL 0.68, not correlating   Goal of Therapy:  Heparin level 0.3-0.7 units/ml aPTT 66-102 seconds Monitor platelets by anticoagulation protocol: Yes   Plan:  Bolus 800 units x 1 Increase heparin infusion to 750 units/hr Will follow aPTT until correlation w/ HL confirmed.  Will check aPTT in 8 hrs after rate change HL & CBC daily while on heparin  Otelia Sergeant, PharmD, [facility] 03/29/2023 5:26 AM

## 2023-03-29 NOTE — Progress Notes (Signed)
[facility]  ROUNDING NOTE   Subjective:   [name] was admitted to [facility] on 03/25/2023 for COPD exacerbation [facility]) [J44.1] Chest pain [R07.9] Community acquired pneumonia, unspecified laterality [J18.9]  Patient seen resting quietly No family at bedside  With complaints of "stuffy nose"  Gradually increased oxygen requirement overnight, now 4 L No lower extremity edema  Creatinine 2.89 (2.84) (2.56) Sodium 127  Objective:  Vital signs in last 24 hours:  Temp:  [97.6 F (36.4 C)-97.9 F (36.6 C)] 97.8 F (36.6 C) (06/03 1213) Pulse Rate:  [64-83] 76 (06/03 1213) Resp:  [14-20] 16 (06/03 1213) BP: (115-136)/(52-95) 134/52 (06/03 1213) SpO2:  [90 %-98 %] 96 % (06/03 1213) FiO2 (%):  [35 %] 35 % (06/02 1530) Weight:  [59.6 kg] 59.6 kg (06/03 0600)  Weight change:  Filed Weights   03/25/23 0931 03/29/23 0600  Weight: 56.2 kg 59.6 kg    Intake/Output: I/O last 3 completed shifts: In: 1749.2 [I.V.:873; IV Piggyback:876.2] Out: 700 [Urine:700]   Intake/Output this shift:  Total I/O In: -  Out: 160 [Urine:160]  Physical Exam: General: NAD,   Head: Normocephalic, atraumatic. Moist oral mucosal membranes  Eyes: Anicteric  Lungs:  Basilar wheeze, no crackles  Heart: Regular rate and rhythm  Abdomen:  Soft, nontender,   Extremities:  no peripheral edema.  Neurologic: Nonfocal, moving all four extremities  Skin: No lesions        Basic Metabolic Panel: Recent Labs  Lab 03/26/23 0444 03/26/23 1428 03/27/23 0437 03/28/23 0531 03/29/23 0412  NA 125* 125* 126* 125* 127*  K 4.5 4.7 4.4 3.9 3.7  CL 96* 94* 97* 94* 95*  CO2 18* 16* 15* 18* 21*  GLUCOSE 264* 231* 151* 218* 118*  BUN 48* 55* 60* 76* 87*  CREATININE 2.03* 2.36* 2.59* 2.84* 2.89*  CALCIUM 8.8* 8.6* 8.4* 7.9* 7.7*  MG  --   --  2.1 2.1  --   PHOS  --   --   --  5.9*  --      Liver Function Tests: Recent Labs  Lab 03/25/23 1001  AST 22  ALT 16  ALKPHOS 73  BILITOT  0.6  PROT 6.2*  ALBUMIN 3.5    No results for input(s): "LIPASE", "AMYLASE" in the last 168 hours. No results for input(s): "AMMONIA" in the last 168 hours.  CBC: Recent Labs  Lab 03/25/23 1001 03/26/23 0444 03/27/23 0437 03/28/23 0531 03/29/23 0412  WBC 12.0* 20.7* 21.9* 15.2* 15.6*  HGB 10.9* 10.3* 9.5* 9.4* 9.7*  HCT 33.7* 31.7* 28.8* 28.8* 29.4*  MCV 88.5 87.6 88.3 87.0 86.2  PLT 268 264 235 225 242     Cardiac Enzymes: No results for input(s): "CKTOTAL", "CKMB", "CKMBINDEX", "TROPONINI" in the last 168 hours.  BNP: Invalid input(s): "POCBNP"  CBG: Recent Labs  Lab 03/28/23 1716 03/29/23 0007 03/29/23 0601 03/29/23 0733 03/29/23 1214  GLUCAP 209* 108* 103* 85 138*     Microbiology: Results for orders placed or performed during the hospital encounter of 03/25/23  Blood Culture (routine x 2)     Status: None (Preliminary result)   Collection Time: 03/25/23 12:09 PM   Specimen: BLOOD  Result Value Ref Range Status   Specimen Description BLOOD LEFT [facility]  Final   Special Requests   Final    BOTTLES DRAWN AEROBIC AND ANAEROBIC Blood Culture adequate volume   Culture   Final    NO GROWTH 4 DAYS Performed at [facility], [address] [place], [place] [address]    Report Status PENDING  Incomplete  Blood Culture (routine x 2)     Status: None (Preliminary result)   Collection Time: 03/25/23 12:10 PM   Specimen: BLOOD  Result Value Ref Range Status   Specimen Description BLOOD LEFT FA  Final   Special Requests   Final    BOTTLES DRAWN AEROBIC AND ANAEROBIC Blood Culture adequate volume   Culture   Final    NO GROWTH 4 DAYS Performed at [facility], [address] [place], [place] [address]    Report Status PENDING  Incomplete  SARS Coronavirus 2 by RT PCR (hospital order, performed in [facility] hospital lab) *cepheid single result test* Anterior Nasal Swab     Status: None   Collection Time: 03/25/23  1:18 PM   Specimen:  Anterior Nasal Swab  Result Value Ref Range Status   SARS Coronavirus 2 by RT PCR NEGATIVE NEGATIVE Final    Comment: (NOTE) SARS-CoV-2 target nucleic acids are NOT DETECTED.  The SARS-CoV-2 RNA is generally detectable in upper and lower respiratory specimens during the acute phase of infection. The lowest concentration of SARS-CoV-2 viral copies this assay can detect is 250 copies / mL. A negative result does not preclude SARS-CoV-2 infection and should not be used as the sole basis for treatment or other patient management decisions.  A negative result may occur with improper specimen collection / handling, submission of specimen other than nasopharyngeal swab, presence of viral mutation(s) within the areas targeted by this assay, and inadequate number of viral copies (<250 copies / mL). A negative result must be combined with clinical observations, patient history, and epidemiological information.  Fact Sheet for Patients:   RoadLapTop.co.za  Fact Sheet for Healthcare Providers: http://kim-miller.com/  This test is not yet approved or  cleared by the [place] FDA and has been authorized for detection and/or diagnosis of SARS-CoV-2 by FDA under an Emergency Use Authorization (EUA).  This EUA will remain in effect (meaning this test can be used) for the duration of the COVID-19 declaration under Section 564(b)(1) of the Act, 21 U.S.C. section 360bbb-3(b)(1), unless the authorization is terminated or revoked sooner.  Performed at [facility], [address] [place], [place] [address]   MRSA Next Gen by PCR, Nasal     Status: None   Collection Time: 03/27/23  4:40 AM   Specimen: Nasal Mucosa; Nasal Swab  Result Value Ref Range Status   MRSA by PCR Next Gen NOT DETECTED NOT DETECTED Final    Comment: (NOTE) The GeneXpert MRSA Assay (FDA approved for NASAL specimens only), is one component of a comprehensive MRSA  colonization surveillance program. It is not intended to diagnose MRSA infection nor to guide or monitor treatment for MRSA infections. Test performance is not FDA approved in patients less than 28 years old. Performed at [facility], [address] [place], [place] [address]   Respiratory (~20 pathogens) panel by PCR     Status: None   Collection Time: 03/28/23  4:00 PM   Specimen: Nasopharyngeal Swab; Respiratory  Result Value Ref Range Status   Adenovirus NOT DETECTED NOT DETECTED Final   Coronavirus 229E NOT DETECTED NOT DETECTED Final    Comment: (NOTE) The Coronavirus on the Respiratory Panel, DOES NOT test for the novel  Coronavirus (2019 nCoV)    Coronavirus HKU1 NOT DETECTED NOT DETECTED Final   Coronavirus NL63 NOT DETECTED NOT DETECTED Final   Coronavirus OC43 NOT DETECTED  NOT DETECTED Final   Metapneumovirus NOT DETECTED NOT DETECTED Final   Rhinovirus / Enterovirus NOT DETECTED NOT DETECTED Final   Influenza A NOT DETECTED NOT DETECTED Final   Influenza B NOT DETECTED NOT DETECTED Final   Parainfluenza Virus 1 NOT DETECTED NOT DETECTED Final   Parainfluenza Virus 2 NOT DETECTED NOT DETECTED Final   Parainfluenza Virus 3 NOT DETECTED NOT DETECTED Final   Parainfluenza Virus 4 NOT DETECTED NOT DETECTED Final   Respiratory Syncytial Virus NOT DETECTED NOT DETECTED Final   Bordetella pertussis NOT DETECTED NOT DETECTED Final   Bordetella Parapertussis NOT DETECTED NOT DETECTED Final   Chlamydophila pneumoniae NOT DETECTED NOT DETECTED Final   Mycoplasma pneumoniae NOT DETECTED NOT DETECTED Final    Comment: Performed at [facility], [address] [place], [place] [address]    Coagulation Studies: No results for input(s): "LABPROT", "INR" in the last 72 hours.   Urinalysis: No results for input(s): "COLORURINE", "LABSPEC", "PHURINE", "GLUCOSEU", "HGBUR", "BILIRUBINUR", "KETONESUR", "PROTEINUR", "UROBILINOGEN", "NITRITE", "LEUKOCYTESUR" in the last  72 hours.  Invalid input(s): "APPERANCEUR"    Imaging: US THORACENTESIS ASP PLEURAL SPACE W/IMG GUIDE  Result Date: 03/29/2023 INDICATION: Bilateral pleural effusions. Request received for diagnostic and therapeutic thoracentesis EXAM: ULTRASOUND GUIDED RIGHT THORACENTESIS MEDICATIONS: 4 cc 1% lidocaine COMPLICATIONS: None immediate. PROCEDURE: An ultrasound guided thoracentesis was thoroughly discussed with the patient and questions answered. The benefits, risks, alternatives and complications were also discussed. The patient understands and wishes to proceed with the procedure. Written consent was obtained. Ultrasound was performed to localize and mark an adequate pocket of fluid in the right chest. The area was then prepped and draped in the normal sterile fashion. 1% Lidocaine was used for local anesthesia. Under ultrasound guidance a 6 Fr Safe-T-Centesis catheter was introduced. Thoracentesis was performed. The catheter was removed and a dressing applied. FINDINGS: A total of approximately 700 mL of pale yellow fluid was removed. Samples were sent to the laboratory as requested by the clinical team. IMPRESSION: Successful ultrasound guided right thoracentesis yielding 700 mL of pleural fluid. Follow-up chest x-ray revealed no evidence of pneumothorax. Procedure performed by Mina Marble, PA-C Electronically Signed   By: Malachy Moan M.D.   On: 03/29/2023 13:25   DG Chest Port 1 View  Result Date: 03/29/2023 CLINICAL DATA:  142230 Pleural effusion 142230 EXAM: PORTABLE CHEST - 1 VIEW COMPARISON:  03/25/2023 FINDINGS: New moderate left pleural effusion with adjacent opacities in the left lower lung. Right lung remains clear. Heart size upper limits normal. Aortic Atherosclerosis (ICD10-170.0). No pneumothorax. Visualized bones unremarkable. IMPRESSION: New moderate left pleural effusion. Electronically Signed   By: Corlis Leak M.D.   On: 03/29/2023 10:48   CT CHEST WO CONTRAST  Result Date:  03/28/2023 CLINICAL DATA:  88 year old female with shortness of breath. EXAM: CT CHEST WITHOUT CONTRAST TECHNIQUE: Multidetector CT imaging of the chest was performed following the standard protocol without IV contrast. RADIATION DOSE REDUCTION: This exam was performed according to the departmental dose-optimization program which includes automated exposure control, adjustment of the mA and/or kV according to patient size and/or use of iterative reconstruction technique. COMPARISON:  Portable chest 03/25/2023 and earlier. FINDINGS: Cardiovascular: Extensive Calcified aortic atherosclerosis. Heavily calcified aberrant origin of the right subclavian artery. Vascular patency is not evaluated in the absence of IV contrast. Mild cardiomegaly. No pericardial effusion. Calcified coronary artery plaque. Mediastinum/Nodes: Small reactive appearing mediastinal lymph nodes. No mediastinal mass. Lungs/Pleura: Moderate bilateral layering pleural effusions with simple fluid density suggesting transudate.  Compressive lower lobe atelectasis. Superimposed moderate to severe centrilobular emphysema, most pronounced in the upper lobes. Major airways remain patent. No air bronchograms. Mild emphysema related architectural distortion including in the lingula. Upper Abdomen: Negative visible noncontrast spleen, pancreas, adrenal glands, bowel. Occasional circumscribed and simple fluid density areas in the partially visible liver appear to be benign patent cysts (no follow-up imaging recommended). Similar exophytic left renal upper pole cyst suspected (no follow-up imaging recommended). Renal vascular calcifications. Musculoskeletal: Severe chronic thoracolumbar junction disc and endplate degeneration in the setting of levoconvex scoliosis there. Degenerative vertebral body sclerosis. Vacuum disc. No acute or suspicious osseous lesion identified. IMPRESSION: 1. Moderate layering bilateral pleural effusions and lower lobe atelectasis  superimposed on fairly advanced Emphysema (ICD10-J43.9). 2. Advanced  Aortic Atherosclerosis (ICD10-I70.0). 3. Spinal degeneration. Electronically Signed   By: Odessa Fleming M.D.   On: 03/28/2023 10:39     Medications:    azithromycin 500 mg (03/29/23 1422)   heparin 750 Units/hr (03/29/23 0606)    arformoterol  15 mcg Nebulization BID   Chlorhexidine Gluconate Cloth  6 each Topical Daily   clotrimazole  1 Applicatorful Vaginal QHS   cyanocobalamin  1,000 mcg Oral Daily   feeding supplement (GLUCERNA SHAKE)  237 mL Oral TID BM   gabapentin  100 mg Oral QHS   insulin aspart  0-9 Units Subcutaneous TID WC   [START ON 03/30/2023] insulin glargine-yfgn  7 Units Subcutaneous QHS   multivitamin  1 tablet Oral BID   promethazine  6.25 mg Oral QHS   revefenacin  175 mcg Nebulization Daily   rosuvastatin  10 mg Oral Once per day on Mon Wed Fri Sat   sodium chloride  1 g Oral BID WC   acetaminophen, alum & mag hydroxide-simeth, dextromethorphan-guaiFENesin, hydrALAZINE, levalbuterol, melatonin, ondansetron (ZOFRAN) IV, oxyCODONE, senna-docusate, traZODone  Assessment/ Plan:  [name] is a 88 y.o.  female with diabetes mellitus type II, hypertension, coronary artery disease, peripheral arterial disease, tremor, CVA, congestive heart failure and anemia who is admitted to [facility] on 03/25/2023 for COPD exacerbation (HCC) [J44.1] Chest pain [R07.9] Community acquired pneumonia, unspecified laterality [J18.9]  Acute kidney injury with acute metabolic acidosis on chronic kidney disease stage IIIB: baseline creatinine of 1.48, GFR of 34 on 12/09/2022. Suspect patient is actually volume depleted.  -Creatinine slightly worse overnight, 700 mL urine output noted on night shift -Continue to hold IV fluids and diuretics   Hypertension with chronic kidney disease: with chronic systolic and diastolic congestive heart failure.  - Holding diuretics.  - holding cardiology input.  -Patient has been transferred  to ICU to initiate milrinone drip.  Hyponatremia: Urine sodium elevated.  - recheck urine lytes and serum osm.  -Sodium slowly improving, 127   LOS: 4   6/3/20242:56 PM

## 2023-03-29 NOTE — Progress Notes (Signed)
Advanced Heart Failure Team Consult Note   Primary Physician: Reubin Milan, MD PCP-Cardiologist:  None  Reason for Consultation: Acute systolic HF  HPI:    Jill Hudson is seen today for evaluation of acute systolic HF at the request of Dr. Kirke Corin.   Ms Husk is an 88 y.o. female with HTN, CAD, PAD, COPD (quit smoking) CKD stage III, A-fib, DM2, history of stroke, and HFpEF    Ms. Beauchene is followed by Dr. Zenaida Deed in Cardiology at [facility] for her CAD and AF. Last seen 01/11/23 and was doing well.   Echo in March 2023 showed normal EF 55-60% with grade 1 diastolic dysfunction, aortic sclerosis. - Right heart catheterization 02/23/2020: RA 3, PA 37/11 (mean 24), wedge 13 - LHC 04/1997 LHC: 40% LM; 95% Ramus intermedius; minor LAD and RCA plaquing  - Patient also has a history of PAD with multiple revascularizations.   - Myocardial perfusion study in May 2021 was normal.     Last Scr was 1.48 on 12/09/22  Patient presented to [facility] ER on 03/25/2023 with several days of progressive SOB. Became acute worse and woke up extremely short of breath.  EMS was called who noted O2 of 86% on room air.  They gave her DuoNebs and Solu-Medrol and route.  Patient denied any chest pain.    In the ER blood pressure was 131/62, pulse rate 113 bpm, respiratory rate 28, afebrile.  Labs showed WBC 12, BNP 971, negative COVID, baseline renal function, sodium 127, K 4.5.  High-sensitivity troponin 225 -> 458 - >1186 -> 1165.  BNP 971 Scr 1.7  EKG showed sinus tachycardia with a heart rate of 113 bpm.  Chest x-ray was concerning for infection,, b/l pleural effusions and mild pulmonary vascular congestion without frank edema. The patient was given IV lasix 40mg , started on IV heparin antibiotics and IV steroids and admitted for further work-up.    Echo read as EF 30-35%. RV ok  -> I reviewed. EF 35-40% there is severe HK of basaliar to mid lateral wall and inferolateral wall  Since being in the hospital Scr has  trended up to 1.7-> 2.36 -> 2.59 -> 2.84 -> 2.89. Lasix was held and started on sodium bicarb by Renal   Had increasing SOB over last 2 days. On/off bipap. CT scan chest 6/2 moderate effusions. + severe underlying COPD. This morning patient had R thora on 6/3 with 700cc out. Initially breathing felt better but now worse again. Chest still feels tight. Says it began in shoulder and has been tight all weak.     Review of Systems: [y] = yes, [ ]  = no   General: Weight gain [ ] ; Weight loss [ ] ; Anorexia [ ] ; Fatigue [place] ]; Fever [ ] ; Chills [ ] ; Weakness [ ]   Cardiac: Chest pain/pressure [ y]; Resting SOB [ y]; Exertional SOB [ y]; Orthopnea [ ] ; Pedal Edema [ ] ; Palpitations [ ] ; Syncope [ ] ; Presyncope [ ] ; Paroxysmal nocturnal dyspnea[ ]   Pulmonary: Cough [ ] ; Wheezing[ ] ; Hemoptysis[ ] ; Sputum [ ] ; Snoring [ ]   GI: Vomiting[ ] ; Dysphagia[ ] ; Melena[ ] ; Hematochezia [ ] ; Heartburn[ ] ; Abdominal pain [ ] ; Constipation [ ] ; Diarrhea [ ] ; BRBPR [ ]   GU: Hematuria[ ] ; Dysuria [ ] ; Nocturia[ ]   Vascular: Pain in legs with walking [ ] ; Pain in feet with lying flat [ ] ; Non-healing sores [ ] ; Stroke [ ] ; TIA [ ] ; Slurred speech [ ] ;  Neuro: Headaches[ ] ;  Vertigo[ ] ; Seizures[ ] ; Paresthesias[ ] ;Blurred vision [ ] ; Diplopia [ ] ; Vision changes [ ]   Ortho/Skin: Arthritis [ y]; Joint pain [ y]; Muscle pain [ ] ; Joint swelling [ ] ; Back Pain [ ] ; Rash [ ]   Psych: Depression[ ] ; Anxiety[ ]   Heme: Bleeding problems [ ] ; Clotting disorders [ ] ; Anemia [ ]   Endocrine: Diabetes [ y]; Thyroid dysfunction[ ]   Home Medications Prior to Admission medications   Medication Sig Start Date End Date Taking? Authorizing Provider  acetaminophen (TYLENOL) 650 MG CR tablet Take 1,300 mg by mouth every 8 (eight) hours as needed for pain.   Yes [provider]  albuterol (VENTOLIN HFA) 108 (90 Base) MCG/ACT inhaler Inhale 2 puffs into the lungs every 6 (six) hours as needed for wheezing or shortness of breath.  01/01/23  Yes Reubin Milan, MD  amLODipine (NORVASC) 5 MG tablet Take 5 mg by mouth in the morning and at bedtime. 09/03/20  Yes [provider]  apixaban (ELIQUIS) 2.5 MG TABS tablet TAKE 1 TABLET(2.5 MG) BY MOUTH TWICE DAILY 03/24/22  Yes Reubin Milan, MD  cloNIDine (CATAPRES) 0.1 MG tablet Take 0.1 mg by mouth 2 (two) times daily as needed. 08/19/21  Yes [provider]  Cyanocobalamin 1000 MCG TBCR Take 1,000 mcg by mouth daily.   Yes [provider]  ferrous sulfate 325 (65 FE) MG tablet Take 325 mg by mouth daily with breakfast.   Yes [provider]  Fluticasone-Umeclidin-Vilant (TRELEGY ELLIPTA) 100-62.5-25 MCG/ACT AEPB Inhale 1 puff into the lungs daily. 02/19/23  Yes Reubin Milan, MD  gabapentin (NEURONTIN) 100 MG capsule TAKE 1 CAPSULE(100 MG) BY MOUTH AT BEDTIME 09/24/22  Yes Reubin Milan, MD  insulin glargine (LANTUS) 100 UNIT/ML injection Inject 0.05 mLs (5 Units total) into the skin daily. 03/24/22  Yes Reubin Milan, MD  Insulin Lispro w/ Trans Port 100 UNIT/ML SOPN 3 Units daily. ( Humalog ) 04/08/21  Yes [provider]  ipratropium-albuterol (DUONEB) 0.5-2.5 (3) MG/3ML SOLN Take 3 mLs by nebulization every 6 (six) hours as needed. 02/19/23  Yes Reubin Milan, MD  losartan (COZAAR) 25 MG tablet Take 25 mg by mouth 2 (two) times daily. Unsure of dosage   Yes [provider]  melatonin 5 MG TABS Take 0.5 tablets (2.5 mg total) by mouth at bedtime as needed (sleep). 02/26/20  Yes Wieting, Richard, MD  Multiple Vitamins-Minerals (PRESERVISION AREDS PO) Take 1 capsule by mouth 2 (two) times daily.    Yes [provider]  promethazine-dextromethorphan (PROMETHAZINE-DM) 6.25-15 MG/5ML syrup TAKE 5 ML BY MOUTH EVERY NIGHT AT BEDTIME 03/19/23  Yes Reubin Milan, MD  rosuvastatin (CRESTOR) 10 MG tablet Take 1 tablet (10 mg total) by mouth 4 (four) times a week. Monday, Wed, Friday and Saturday 01/02/23  Yes  Reubin Milan, MD  torsemide (DEMADEX) 20 MG tablet Take 2 tablets (40 mg total) by mouth daily. Patient taking differently: Take 40 mg by mouth 2 (two) times daily. 1/2 tablet morning and 1/2 tablet at night. 02/27/20  Yes Wieting, Richard, MD  feeding supplement, GLUCERNA SHAKE, (GLUCERNA SHAKE) LIQD Take 237 mLs by mouth 3 (three) times daily between meals. 12/28/19   Alford Highland, MD  Insulin Pen Needle (B-D ULTRAFINE III SHORT PEN) 31G X 8 MM MISC USE AS DIRECTED 03/24/22   Reubin Milan, MD    Past Medical History: Past Medical History:  Diagnosis Date   Acute CHF (congestive heart failure) (HCC) 02/18/2020  Acute diastolic CHF (congestive heart failure) (HCC)    Acute kidney injury superimposed on CKD (HCC)    Allergies    Anxiety    Arthritis    spine and shoulder   Atherosclerosis of artery of extremity with rest pain (HCC) 10/12/2018   Cancer (HCC)    skin   CHF (congestive heart failure) (HCC)    Diabetes mellitus without complication (HCC)    Diverticulosis of large intestine with hemorrhage    GI bleeding 12/25/2019   Heart murmur    Hyperlipidemia    Hypertension    Macula lutea degeneration    Mitral and aortic valve disease    Myocardial infarction (HCC)    may have had a "light" heart attack   Non-ST elevation (NSTEMI) myocardial infarction (HCC)    Occasional tremors    PAD (peripheral artery disease) (HCC)    Rectal bleeding    Shingles    patient unaware but daughter confirms. it was a long time ago   Stroke [facility]) 01/2017   may have had a slight stroke   TIA (transient ischemic attack) 01/2017   UTI (urinary tract infection)    Vascular disease, peripheral (HCC)     Past Surgical History: Past Surgical History:  Procedure Laterality Date   CARDIAC CATHETERIZATION  1998   40% LM, 95% Ramus interm   CATARACT EXTRACTION, BILATERAL     COLONOSCOPY WITH PROPOFOL N/A 08/12/2018   Procedure: COLONOSCOPY WITH PROPOFOL;  Surgeon: Midge Minium, MD;   Location: [facility] ENDOSCOPY;  Service: Endoscopy;  Laterality: N/A;   ENDARTERECTOMY FEMORAL Left 10/12/2018   Procedure: ENDARTERECTOMY FEMORAL;  Surgeon: Renford Dills, MD;  Location: [facility] ORS;  Service: Vascular;  Laterality: Left;  angioplasty and left SFA stent placement   EYE SURGERY Bilateral    cataract extractions   LOWER EXTREMITY ANGIOGRAPHY Left 08/23/2017   Procedure: Lower Extremity Angiography;  Surgeon: Annice Needy, MD;  Location: [facility] INVASIVE CV LAB;  Service: Cardiovascular;  Laterality: Left;   LOWER EXTREMITY ANGIOGRAPHY Left 07/26/2018   Procedure: LOWER EXTREMITY ANGIOGRAPHY;  Surgeon: Renford Dills, MD;  Location: [facility] INVASIVE CV LAB;  Service: Cardiovascular;  Laterality: Left;   LOWER EXTREMITY ANGIOGRAPHY Left 09/16/2018   Procedure: LOWER EXTREMITY ANGIOGRAPHY;  Surgeon: Renford Dills, MD;  Location: [facility] INVASIVE CV LAB;  Service: Cardiovascular;  Laterality: Left;   PTCA  08/2013   Left common iliac   PTCA  12/2012   left ext iliac   RIGHT HEART CATH N/A 02/23/2020   Procedure: RIGHT HEART CATH;  Surgeon: Antonieta Iba, MD;  Location: [facility] INVASIVE CV LAB;  Service: Cardiovascular;  Laterality: N/A;   TUBAL LIGATION      Family History: Family History  Problem Relation Age of Onset   Dementia Mother    Diabetes Father     Social History: Social History   Socioeconomic History   Marital status: Widowed    Spouse name: kermit   Number of children: 3   Years of education: Not on file   Highest education level: 12th grade  Occupational History   Occupation: Retired    Comment: homemaker  Tobacco Use   Smoking status: Former    Packs/day: 2.00    Years: 20.00    Additional pack years: 0.00    Total pack years: 40.00    Types: Cigarettes    Quit date: 1980    Years since quitting: 44.4   Smokeless tobacco: Never   Tobacco comments:  smoking cessation materials not required  Vaping Use   Vaping Use: Never used  Substance  and Sexual Activity   Alcohol use: No    Alcohol/week: 0.0 standard drinks of alcohol   Drug use: No   Sexual activity: Not Currently  Other Topics Concern   Not on file  Social History Narrative   Husband has dementia and may wander. Requires constant supervision. Pt's daughter also lives with them. Her son [name] is a Insurance account manager.    Social Determinants of Health   Financial Resource Strain: Low Risk  (01/01/2023)   Overall Financial Resource Strain (CARDIA)    Difficulty of Paying Living Expenses: Not hard at all  Food Insecurity: No Food Insecurity (03/25/2023)   Hunger Vital Sign    Worried About Running Out of Food in the Last Year: Never true    Ran Out of Food in the Last Year: Never true  Transportation Needs: No Transportation Needs (03/25/2023)   PRAPARE - Administrator, Civil Service (Medical): No    Lack of Transportation (Non-Medical): No  Physical Activity: Insufficiently Active (03/11/2022)   Exercise Vital Sign    Days of Exercise per Week: 3 days    Minutes of Exercise per Session: 10 min  Stress: No Stress Concern Present (03/11/2022)   [employer] of Occupational Health - Occupational Stress Questionnaire    Feeling of Stress : Not at all  Social Connections: Socially Integrated (05/12/2022)   Social Connection and Isolation Panel [NHANES]    Frequency of Communication with Friends and Family: More than three times a week    Frequency of Social Gatherings with Friends and Family: More than three times a week    Attends Religious Services: More than 4 times per year    Active Member of [employer] or Organizations: Yes    Attends Engineer, structural: More than 4 times per year    Marital Status: Married    Allergies:  Allergies  Allergen Reactions   Cefdinir Diarrhea   Saxagliptin Diarrhea   Epinephrine Other (See Comments)    Patient does not remember what happens when she uses this   Evolocumab     Pain with injection   Atorvastatin  Other (See Comments)    Muscle aches   Codeine Other (See Comments)    Upset stomach   Ezetimibe Other (See Comments)    Myalgias(ZETIA)   Limonene Rash    Patient does not recall this reaction   Nitrofurantoin Rash and Other (See Comments)    Pruitus   Sulfa Antibiotics Rash and Other (See Comments)    Sore mouth     Objective:    Vital Signs:   Temp:  [97.6 F (36.4 C)-98 F (36.7 C)] 97.9 F (36.6 C) (06/03 0731) Pulse Rate:  [64-83] 64 (06/03 0731) Resp:  [14-24] 14 (06/03 0731) BP: (115-136)/(54-95) 119/54 (06/03 0731) SpO2:  [90 %-98 %] 96 % (06/03 0731) FiO2 (%):  [35 %] 35 % (06/02 1530) Weight:  [59.6 kg] 59.6 kg (06/03 0600)    Weight change: Filed Weights   03/25/23 0931 03/29/23 0600  Weight: 56.2 kg 59.6 kg    Intake/Output:   Intake/Output Summary (Last 24 hours) at 03/29/2023 0940 Last data filed at 03/29/2023 0600 Gross per 24 hour  Intake --  Output 700 ml  Net -700 ml      Physical Exam    GEN: Elderly sitting in chair on O2 HEENT: normal Neck: supple. JVP hard to see.  Appears to jaw   Carotids 2+ bilat; no bruits. No lymphadenopathy or thyromegaly appreciated. Cor: PMI nondisplaced. Regular rate & rhythm. + rub. Lungs: + crackles on left 1/3 up  Abdomen: soft, nontender, nondistended. No hepatosplenomegaly. No bruits or masses. Good bowel sounds. Extremities: no cyanosis, clubbing, rash, edema Neuro: alert & orientedx3, cranial nerves grossly intact. moves all 4 extremities w/o difficulty. Affect pleasant   Telemetry   Sinus 70s  Personally reviewed  EKG    Sinus 77 No ST-T wave abnormalities. Personally reviewed   Labs   Basic Metabolic Panel: Recent Labs  Lab 03/26/23 0444 03/26/23 1428 03/27/23 0437 03/28/23 0531 03/29/23 0412  NA 125* 125* 126* 125* 127*  K 4.5 4.7 4.4 3.9 3.7  CL 96* 94* 97* 94* 95*  CO2 18* 16* 15* 18* 21*  GLUCOSE 264* 231* 151* 218* 118*  BUN 48* 55* 60* 76* 87*  CREATININE 2.03* 2.36* 2.59*  2.84* 2.89*  CALCIUM 8.8* 8.6* 8.4* 7.9* 7.7*  MG  --   --  2.1 2.1  --   PHOS  --   --   --  5.9*  --     Liver Function Tests: Recent Labs  Lab 03/25/23 1001  AST 22  ALT 16  ALKPHOS 73  BILITOT 0.6  PROT 6.2*  ALBUMIN 3.5   No results for input(s): "LIPASE", "AMYLASE" in the last 168 hours. No results for input(s): "AMMONIA" in the last 168 hours.  CBC: Recent Labs  Lab 03/25/23 1001 03/26/23 0444 03/27/23 0437 03/28/23 0531 03/29/23 0412  WBC 12.0* 20.7* 21.9* 15.2* 15.6*  HGB 10.9* 10.3* 9.5* 9.4* 9.7*  HCT 33.7* 31.7* 28.8* 28.8* 29.4*  MCV 88.5 87.6 88.3 87.0 86.2  PLT 268 264 235 225 242    Cardiac Enzymes: No results for input(s): "CKTOTAL", "CKMB", "CKMBINDEX", "TROPONINI" in the last 168 hours.  BNP: BNP (last 3 results) Recent Labs    03/25/23 1001  BNP 971.3*    ProBNP (last 3 results) No results for input(s): "PROBNP" in the last 8760 hours.   CBG: Recent Labs  Lab 03/28/23 1205 03/28/23 1716 03/29/23 0007 03/29/23 0601 03/29/23 0733  GLUCAP 201* 209* 108* 103* 85    Coagulation Studies: No results for input(s): "LABPROT", "INR" in the last 72 hours.   Imaging   CT CHEST WO CONTRAST  Result Date: 03/28/2023 CLINICAL DATA:  88 year old female with shortness of breath. EXAM: CT CHEST WITHOUT CONTRAST TECHNIQUE: Multidetector CT imaging of the chest was performed following the standard protocol without IV contrast. RADIATION DOSE REDUCTION: This exam was performed according to the departmental dose-optimization program which includes automated exposure control, adjustment of the mA and/or kV according to patient size and/or use of iterative reconstruction technique. COMPARISON:  Portable chest 03/25/2023 and earlier. FINDINGS: Cardiovascular: Extensive Calcified aortic atherosclerosis. Heavily calcified aberrant origin of the right subclavian artery. Vascular patency is not evaluated in the absence of IV contrast. Mild cardiomegaly. No  pericardial effusion. Calcified coronary artery plaque. Mediastinum/Nodes: Small reactive appearing mediastinal lymph nodes. No mediastinal mass. Lungs/Pleura: Moderate bilateral layering pleural effusions with simple fluid density suggesting transudate. Compressive lower lobe atelectasis. Superimposed moderate to severe centrilobular emphysema, most pronounced in the upper lobes. Major airways remain patent. No air bronchograms. Mild emphysema related architectural distortion including in the lingula. Upper Abdomen: Negative visible noncontrast spleen, pancreas, adrenal glands, bowel. Occasional circumscribed and simple fluid density areas in the partially visible liver appear to be benign patent cysts (no follow-up imaging recommended). Similar exophytic  left renal upper pole cyst suspected (no follow-up imaging recommended). Renal vascular calcifications. Musculoskeletal: Severe chronic thoracolumbar junction disc and endplate degeneration in the setting of levoconvex scoliosis there. Degenerative vertebral body sclerosis. Vacuum disc. No acute or suspicious osseous lesion identified. IMPRESSION: 1. Moderate layering bilateral pleural effusions and lower lobe atelectasis superimposed on fairly advanced Emphysema (ICD10-J43.9). 2. Advanced  Aortic Atherosclerosis (ICD10-I70.0). 3. Spinal degeneration. Electronically Signed   By: Odessa Fleming M.D.   On: 03/28/2023 10:39     Medications:     Current Medications:  arformoterol  15 mcg Nebulization BID   bisoprolol  5 mg Oral Daily   Chlorhexidine Gluconate Cloth  6 each Topical Daily   clotrimazole  1 Applicatorful Vaginal QHS   cyanocobalamin  1,000 mcg Oral Daily   dextromethorphan  15 mg Oral QHS   feeding supplement (GLUCERNA SHAKE)  237 mL Oral TID BM   ferrous sulfate  325 mg Oral Q breakfast   gabapentin  100 mg Oral QHS   insulin aspart  0-9 Units Subcutaneous TID WC   insulin glargine-yfgn  10 Units Subcutaneous Daily   multivitamin  1 tablet  Oral BID   promethazine  6.25 mg Oral QHS   revefenacin  175 mcg Nebulization Daily   rosuvastatin  10 mg Oral Once per day on Mon Wed Fri Sat   sodium chloride  1 g Oral BID WC    Infusions:  azithromycin Stopped (03/28/23 1449)   cefTRIAXone (ROCEPHIN)  IV Stopped (03/28/23 1331)   heparin 750 Units/hr (03/29/23 0606)      Assessment/Plan   1. Acute systolic HF - Echo 2023 EF 55-60% - Echo this admit read as EF 30-35%. RV ok  -> I reviewed. EF 35-40% there is severe HK of basaliar to mid lateral wall and inferolateral wall - High-sensitivity troponin 225 -> 458 - >1186 -> 1165.  BNP 971  - Suspect this is an ischemic CM with possible low output HF and elevated volume status.  - Would stop b-blocker add move to ICU for central line to monitor CVP and co-ox - Restart IV lasix. If co-ox < 55%  would start milrinone at 0.125 mcg/kg/min - If not improving will need RHC - Eventually will need coronary if renal function improves  2. NSTEMI - echo as above with RWMA - no current s/s angina - start ASA - continue statin/heparin for now - with rub in exam consider colchicine  3. AKI on CKD 3a - likely ATN/cardiorenal  - Scr 1.5 -> 2.8 - Plan as above  - renal u/s 3/21 with medico-renal disease. May be worth repeating  4. Acute hypoxic respiratory failure - likely due primarily to HF and pleural effusions on top of underlying COPD - s/p R thora on 6/3 with 700cc out   5. PAF  - in NSR. Continue heparin   I spoke with her daughter by phone. I also spork with Drs. Wouk and Kasa personally  CRITICAL CARE Performed by: Arvilla Meres  Total critical care time: 45 minutes  Critical care time was exclusive of separately billable procedures and treating other patients.  Critical care was necessary to treat or prevent imminent or life-threatening deterioration.  Critical care was time spent personally by me (independent of midlevel providers or residents) on the following  activities: development of treatment plan with patient and/or surrogate as well as nursing, discussions with consultants, evaluation of patient's response to treatment, examination of patient, obtaining history from patient or surrogate, ordering and  performing treatments and interventions, ordering and review of laboratory studies, ordering and review of radiographic studies, pulse oximetry and re-evaluation of patient's condition.   Length of Stay: 4  Arvilla Meres, MD  03/29/2023, 9:40 AM  Advanced Heart Failure Team Pager [phone] (M-F; 7a - 5p)  Please contact [facility] for night-coverage after hours (4p -7a ) and weekends on [email or web address]

## 2023-03-29 NOTE — Progress Notes (Signed)
Ordered IV lasix 120 mg bid per Dr. Prescott Gum request/recommendations.  Will continue to monitor and assess pt  Jill Hudson, [facility]  Pulmonary/Critical Care Pager [phone] (please enter 7 digits) PCCM Consult Pager [phone] (please enter 7 digits)

## 2023-03-29 NOTE — Progress Notes (Addendum)
PROGRESS NOTE    Jill Hudson  ZOX:[id number] DOB: 1935/10/07 DOA: 03/25/2023 PCP: Reubin Milan, MD   Brief Narrative:  This 88 year old female with history of COPD, HTN, HLD, DM2, stroke, TIA, GERD, anxiety, PVD, CAD, MI, former tobacco use, CKD stage III, GI bleed, iron deficiency anemia, PAD status post PCI comes to the hospital with shortness of breath and hypoxia. Patient was found to have bilateral pulmonary edema /effusion with elevated BNP.  Patient is admitted for further evaluation.  Cardiology is consulted.  Patient will eventually need right and left heart cath pending improvement in renal functions.  Assessment & Plan:   Principal Problem:   COPD exacerbation (HCC) Active Problems:   Severe sepsis (HCC)   Acute on chronic diastolic CHF (congestive heart failure) (HCC)   Essential hypertension   History of CVA (cerebrovascular accident)   CAD (coronary artery disease)   Myocardial injury   Atrial fibrillation with RVR (HCC)   Type II diabetes mellitus with renal manifestations (HCC)   Chronic kidney disease, stage 3a (HCC)   Hyponatremia   HLD (hyperlipidemia)   Iron deficiency anemia   PAD (peripheral artery disease) (HCC)   AKI (acute kidney injury) (HCC)   Acute respiratory failure with hypoxia (HCC)   Acute combined systolic and diastolic heart failure (HCC)   Chest pain   Cardiogenic shock (HCC)  Acute systolic CHF: Acute on chronic diastolic CHF:  Class III Previous echocardiogram in March 2023 showed LVEF of 55%.   Now has clinical evidence of bilateral pulmonary edema / effusion and elevated BNP.   Repeat echo shows LVEF 30 to 35%. Cardiology consulted.  Patient will need left and right heart catheterization.  She will also need TEE to better evaluate her mitral valve.  She is not a candidate for catheterization at this time due to worsening renal functions. Diuresis on hold Prognosis guarded, palliative consulted PT/OT consults   NSTEMI: History of  CAD Troponin have significantly trended up.  LDL 41, A1c 7.9.  Continue to trend troponin. Not a candidate for catheterization at this time due to worsening renal functions. On heparin gtt   Hyponatremia: Suspect from chf, aki on ckd Continue to monitor, some improvement today Treat underlying problems Is on salt tabs   Acute kidney injury on CKD stage III A: Baseline creatinine 1.3.  Today is 2.89, stable from yesterday. Closely monitor.   Bladder scan showed 350cc, foley placed 5/31 for accurate I/Os Uop 700 last 24 Nephrology says foley not needed, will d/c and monitor for retention   Metabolic acidosis Improving, now off bicarb gtt   COPD exacerbation: CAP Pleural effusions Initially wheezing now resolved. Blood cultures neg. Asymetric opacities seen on cxr. Covid and rvp neg. - 700 cc right thoracentesis today, labs pending - continue abx (ceftriaxone/azithromycin)   Essential hypertension: Continue amlodipine.  IV as needed.   History of CVA (cerebrovascular accident) On heparin drip and statin.   Atrial fibrillation with RVR (HCC) History of paroxysmal atrial fibrillation Continue metoprolol and heparin drip.   Type II diabetes mellitus with renal manifestations [facility]):  Recent A1c 7.9.  sugars controlled today - continue insulin   HLD (hyperlipidemia) Continue Crestor   Iron deficiency anemia: Hemoglobin 10.9 (11.2 on 03/24/2022) iron supplement   PAD (peripheral artery disease) (HCC): S/p of a stent placement to the left leg -Patient is on Crestor and Eliquis at home     DVT prophylaxis: Heparin drip Code Status: Full code Family Communication: daughter and son (who  is a neurologist) updated telephonically 6/3 Disposition Plan:     Status is: Inpatient Remains inpatient appropriate because: Acutely ill , continue hospital stay due to multiple medical issues.   Consultants:  Cardiology  Procedures: None  Antimicrobials:  Anti-infectives (From  admission, onward)    Start     Dose/Rate Route Frequency Ordered Stop   03/28/23 1200  cefTRIAXone (ROCEPHIN) 1 g in sodium chloride 0.9 % 100 mL IVPB        1 g 200 mL/hr over 30 Minutes Intravenous Every 24 hours 03/28/23 0945 03/30/23 1159   03/26/23 1600  vancomycin (VANCOREADY) IVPB 1250 mg/250 mL        1,250 mg 166.7 mL/hr over 90 Minutes Intravenous  Once 03/26/23 1452 03/26/23 1838   03/26/23 1452  vancomycin variable dose per unstable renal function (pharmacist dosing)  Status:  Discontinued         Does not apply See admin instructions 03/26/23 1452 03/27/23 1335   03/25/23 1145  cefTRIAXone (ROCEPHIN) 2 g in sodium chloride 0.9 % 100 mL IVPB  Status:  Discontinued        2 g 200 mL/hr over 30 Minutes Intravenous Every 24 hours 03/25/23 1135 03/28/23 0945   03/25/23 1145  azithromycin (ZITHROMAX) 500 mg in sodium chloride 0.9 % 250 mL IVPB        500 mg 250 mL/hr over 60 Minutes Intravenous Every 24 hours 03/25/23 1135 03/30/23 1144      Subjective: Patient seen and examined at bedside. Breathing stable, denies pain, tolerated IR procedure  Objective: Vitals:   03/29/23 0600 03/29/23 0731 03/29/23 0900 03/29/23 1213  BP:  (!) 119/54 (!) 134/57 (!) 134/52  Pulse:  64 72 76  Resp:  14  16  Temp:  97.9 F (36.6 C)  97.8 F (36.6 C)  TempSrc:      SpO2:  96% 91% 96%  Weight: 59.6 kg     Height:        Intake/Output Summary (Last 24 hours) at 03/29/2023 1250 Last data filed at 03/29/2023 0600 Gross per 24 hour  Intake --  Output 700 ml  Net -700 ml   Filed Weights   03/25/23 0931 03/29/23 0600  Weight: 56.2 kg 59.6 kg    Examination:  General exam: Appears calm and comfortable, deconditioned, not in any acute distress. Respiratory system: Clear to auscultation. Respiratory effort normal.  RR 15 Cardiovascular system: distant heart sounds Gastrointestinal system: Abdomen is soft, non tender, non distended, bowel sounds+ Central nervous system: Alert, a little  confused Extremities: mild pitting LE edema to knees Skin: No rashes, lesions or ulcers Psychiatry: Judgement and insight appear normal. Mood & affect appropriate.     Data Reviewed: I have personally reviewed following labs and imaging studies  CBC: Recent Labs  Lab 03/25/23 1001 03/26/23 0444 03/27/23 0437 03/28/23 0531 03/29/23 0412  WBC 12.0* 20.7* 21.9* 15.2* 15.6*  HGB 10.9* 10.3* 9.5* 9.4* 9.7*  HCT 33.7* 31.7* 28.8* 28.8* 29.4*  MCV 88.5 87.6 88.3 87.0 86.2  PLT 268 264 235 225 242   Basic Metabolic Panel: Recent Labs  Lab 03/26/23 0444 03/26/23 1428 03/27/23 0437 03/28/23 0531 03/29/23 0412  NA 125* 125* 126* 125* 127*  K 4.5 4.7 4.4 3.9 3.7  CL 96* 94* 97* 94* 95*  CO2 18* 16* 15* 18* 21*  GLUCOSE 264* 231* 151* 218* 118*  BUN 48* 55* 60* 76* 87*  CREATININE 2.03* 2.36* 2.59* 2.84* 2.89*  CALCIUM 8.8* 8.6* 8.4*  7.9* 7.7*  MG  --   --  2.1 2.1  --   PHOS  --   --   --  5.9*  --    GFR: Estimated Creatinine Clearance: 11.3 mL/min (A) (by C-G formula based on SCr of 2.89 mg/dL (H)). Liver Function Tests: Recent Labs  Lab 03/25/23 1001  AST 22  ALT 16  ALKPHOS 73  BILITOT 0.6  PROT 6.2*  ALBUMIN 3.5   No results for input(s): "LIPASE", "AMYLASE" in the last 168 hours. No results for input(s): "AMMONIA" in the last 168 hours. Coagulation Profile: Recent Labs  Lab 03/26/23 0700  INR 1.4*   Cardiac Enzymes: No results for input(s): "CKTOTAL", "CKMB", "CKMBINDEX", "TROPONINI" in the last 168 hours. BNP (last 3 results) No results for input(s): "PROBNP" in the last 8760 hours. HbA1C: No results for input(s): "HGBA1C" in the last 72 hours.  CBG: Recent Labs  Lab 03/28/23 1716 03/29/23 0007 03/29/23 0601 03/29/23 0733 03/29/23 1214  GLUCAP 209* 108* 103* 85 138*   Lipid Profile: No results for input(s): "CHOL", "HDL", "LDLCALC", "TRIG", "CHOLHDL", "LDLDIRECT" in the last 72 hours.  Thyroid Function Tests: No results for input(s):  "TSH", "T4TOTAL", "FREET4", "T3FREE", "THYROIDAB" in the last 72 hours. Anemia Panel: No results for input(s): "VITAMINB12", "FOLATE", "FERRITIN", "TIBC", "IRON", "RETICCTPCT" in the last 72 hours. Sepsis Labs: Recent Labs  Lab 03/25/23 1209 03/25/23 1318 03/25/23 1548 03/25/23 1748 03/26/23 0444 03/26/23 0850 03/26/23 1110  PROCALCITON 0.16  --   --   --  2.84  --   --   LATICACIDVEN 2.9*   < > 5.5* 6.7*  --  2.8* 2.0*   < > = values in this interval not displayed.    Recent Results (from the past 240 hour(s))  Blood Culture (routine x 2)     Status: None (Preliminary result)   Collection Time: 03/25/23 12:09 PM   Specimen: BLOOD  Result Value Ref Range Status   Specimen Description BLOOD LEFT [facility]  Final   Special Requests   Final    BOTTLES DRAWN AEROBIC AND ANAEROBIC Blood Culture adequate volume   Culture   Final    NO GROWTH 4 DAYS Performed at [facility], [address] [place], [place] [address]    Report Status PENDING  Incomplete  Blood Culture (routine x 2)     Status: None (Preliminary result)   Collection Time: 03/25/23 12:10 PM   Specimen: BLOOD  Result Value Ref Range Status   Specimen Description BLOOD LEFT FA  Final   Special Requests   Final    BOTTLES DRAWN AEROBIC AND ANAEROBIC Blood Culture adequate volume   Culture   Final    NO GROWTH 4 DAYS Performed at [facility], [address] [place], [place] [address]    Report Status PENDING  Incomplete  SARS Coronavirus 2 by RT PCR (hospital order, performed in [facility] hospital lab) *cepheid single result test* Anterior Nasal Swab     Status: None   Collection Time: 03/25/23  1:18 PM   Specimen: Anterior Nasal Swab  Result Value Ref Range Status   SARS Coronavirus 2 by RT PCR NEGATIVE NEGATIVE Final    Comment: (NOTE) SARS-CoV-2 target nucleic acids are NOT DETECTED.  The SARS-CoV-2 RNA is generally detectable in upper and lower respiratory specimens during the acute phase  of infection. The lowest concentration of SARS-CoV-2 viral copies this assay can detect is 250 copies / mL. A negative result does not  preclude SARS-CoV-2 infection and should not be used as the sole basis for treatment or other patient management decisions.  A negative result may occur with improper specimen collection / handling, submission of specimen other than nasopharyngeal swab, presence of viral mutation(s) within the areas targeted by this assay, and inadequate number of viral copies (<250 copies / mL). A negative result must be combined with clinical observations, patient history, and epidemiological information.  Fact Sheet for Patients:   RoadLapTop.co.za  Fact Sheet for Healthcare Providers: http://kim-miller.com/  This test is not yet approved or  cleared by the [place] FDA and has been authorized for detection and/or diagnosis of SARS-CoV-2 by FDA under an Emergency Use Authorization (EUA).  This EUA will remain in effect (meaning this test can be used) for the duration of the COVID-19 declaration under Section 564(b)(1) of the Act, 21 U.S.C. section 360bbb-3(b)(1), unless the authorization is terminated or revoked sooner.  Performed at [facility], [address] [place], [place] [address]   MRSA Next Gen by PCR, Nasal     Status: None   Collection Time: 03/27/23  4:40 AM   Specimen: Nasal Mucosa; Nasal Swab  Result Value Ref Range Status   MRSA by PCR Next Gen NOT DETECTED NOT DETECTED Final    Comment: (NOTE) The GeneXpert MRSA Assay (FDA approved for NASAL specimens only), is one component of a comprehensive MRSA colonization surveillance program. It is not intended to diagnose MRSA infection nor to guide or monitor treatment for MRSA infections. Test performance is not FDA approved in patients less than 65 years old. Performed at [facility], [address] [place], [place]  [address]   Respiratory (~20 pathogens) panel by PCR     Status: None   Collection Time: 03/28/23  4:00 PM   Specimen: Nasopharyngeal Swab; Respiratory  Result Value Ref Range Status   Adenovirus NOT DETECTED NOT DETECTED Final   Coronavirus 229E NOT DETECTED NOT DETECTED Final    Comment: (NOTE) The Coronavirus on the Respiratory Panel, DOES NOT test for the novel  Coronavirus (2019 nCoV)    Coronavirus HKU1 NOT DETECTED NOT DETECTED Final   Coronavirus NL63 NOT DETECTED NOT DETECTED Final   Coronavirus OC43 NOT DETECTED NOT DETECTED Final   Metapneumovirus NOT DETECTED NOT DETECTED Final   Rhinovirus / Enterovirus NOT DETECTED NOT DETECTED Final   Influenza A NOT DETECTED NOT DETECTED Final   Influenza B NOT DETECTED NOT DETECTED Final   Parainfluenza Virus 1 NOT DETECTED NOT DETECTED Final   Parainfluenza Virus 2 NOT DETECTED NOT DETECTED Final   Parainfluenza Virus 3 NOT DETECTED NOT DETECTED Final   Parainfluenza Virus 4 NOT DETECTED NOT DETECTED Final   Respiratory Syncytial Virus NOT DETECTED NOT DETECTED Final   Bordetella pertussis NOT DETECTED NOT DETECTED Final   Bordetella Parapertussis NOT DETECTED NOT DETECTED Final   Chlamydophila pneumoniae NOT DETECTED NOT DETECTED Final   Mycoplasma pneumoniae NOT DETECTED NOT DETECTED Final    Comment: Performed at [facility], [address] [place], [place] [address]         Radiology Studies: DG Chest Port 1 View  Result Date: 03/29/2023 CLINICAL DATA:  [id number] Pleural effusion [id number] EXAM: PORTABLE CHEST - 1 VIEW COMPARISON:  03/25/2023 FINDINGS: New moderate left pleural effusion with adjacent opacities in the left lower lung. Right lung remains clear. Heart size upper limits normal. Aortic Atherosclerosis (ICD10-170.0). No pneumothorax. Visualized bones unremarkable. IMPRESSION: New moderate left pleural effusion. Electronically Signed  By: Corlis Leak M.D.   On: 03/29/2023 10:48   CT CHEST WO CONTRAST  Result  Date: 03/28/2023 CLINICAL DATA:  88 year old female with shortness of breath. EXAM: CT CHEST WITHOUT CONTRAST TECHNIQUE: Multidetector CT imaging of the chest was performed following the standard protocol without IV contrast. RADIATION DOSE REDUCTION: This exam was performed according to the departmental dose-optimization program which includes automated exposure control, adjustment of the mA and/or kV according to patient size and/or use of iterative reconstruction technique. COMPARISON:  Portable chest 03/25/2023 and earlier. FINDINGS: Cardiovascular: Extensive Calcified aortic atherosclerosis. Heavily calcified aberrant origin of the right subclavian artery. Vascular patency is not evaluated in the absence of IV contrast. Mild cardiomegaly. No pericardial effusion. Calcified coronary artery plaque. Mediastinum/Nodes: Small reactive appearing mediastinal lymph nodes. No mediastinal mass. Lungs/Pleura: Moderate bilateral layering pleural effusions with simple fluid density suggesting transudate. Compressive lower lobe atelectasis. Superimposed moderate to severe centrilobular emphysema, most pronounced in the upper lobes. Major airways remain patent. No air bronchograms. Mild emphysema related architectural distortion including in the lingula. Upper Abdomen: Negative visible noncontrast spleen, pancreas, adrenal glands, bowel. Occasional circumscribed and simple fluid density areas in the partially visible liver appear to be benign patent cysts (no follow-up imaging recommended). Similar exophytic left renal upper pole cyst suspected (no follow-up imaging recommended). Renal vascular calcifications. Musculoskeletal: Severe chronic thoracolumbar junction disc and endplate degeneration in the setting of levoconvex scoliosis there. Degenerative vertebral body sclerosis. Vacuum disc. No acute or suspicious osseous lesion identified. IMPRESSION: 1. Moderate layering bilateral pleural effusions and lower lobe atelectasis  superimposed on fairly advanced Emphysema (ICD10-J43.9). 2. Advanced  Aortic Atherosclerosis (ICD10-I70.0). 3. Spinal degeneration. Electronically Signed   By: Odessa Fleming M.D.   On: 03/28/2023 10:39    Scheduled Meds:  arformoterol  15 mcg Nebulization BID   Chlorhexidine Gluconate Cloth  6 each Topical Daily   clotrimazole  1 Applicatorful Vaginal QHS   cyanocobalamin  1,000 mcg Oral Daily   dextromethorphan  15 mg Oral QHS   feeding supplement (GLUCERNA SHAKE)  237 mL Oral TID BM   ferrous sulfate  325 mg Oral Q breakfast   gabapentin  100 mg Oral QHS   insulin aspart  0-9 Units Subcutaneous TID WC   [START ON 03/30/2023] insulin glargine-yfgn  7 Units Subcutaneous QHS   multivitamin  1 tablet Oral BID   promethazine  6.25 mg Oral QHS   revefenacin  175 mcg Nebulization Daily   rosuvastatin  10 mg Oral Once per day on Mon Wed Fri Sat   sodium chloride  1 g Oral BID WC   Continuous Infusions:  azithromycin Stopped (03/28/23 1449)   cefTRIAXone (ROCEPHIN)  IV Stopped (03/28/23 1331)   heparin 750 Units/hr (03/29/23 0606)     LOS: 4 days    Time spent: 35 mins    Silvano Bilis, MD [facility]   If 7PM-7AM, please contact night-coverage

## 2023-03-30 DIAGNOSIS — J9601 Acute respiratory failure with hypoxia: Secondary | ICD-10-CM | POA: Diagnosis not present

## 2023-03-30 DIAGNOSIS — Z7189 Other specified counseling: Secondary | ICD-10-CM | POA: Diagnosis not present

## 2023-03-30 DIAGNOSIS — I5021 Acute systolic (congestive) heart failure: Secondary | ICD-10-CM | POA: Diagnosis not present

## 2023-03-30 DIAGNOSIS — N1832 Chronic kidney disease, stage 3b: Secondary | ICD-10-CM | POA: Diagnosis not present

## 2023-03-30 DIAGNOSIS — I480 Paroxysmal atrial fibrillation: Secondary | ICD-10-CM | POA: Diagnosis not present

## 2023-03-30 DIAGNOSIS — I214 Non-ST elevation (NSTEMI) myocardial infarction: Secondary | ICD-10-CM | POA: Diagnosis not present

## 2023-03-30 DIAGNOSIS — N183 Chronic kidney disease, stage 3 unspecified: Secondary | ICD-10-CM | POA: Diagnosis not present

## 2023-03-30 DIAGNOSIS — E871 Hypo-osmolality and hyponatremia: Secondary | ICD-10-CM | POA: Diagnosis not present

## 2023-03-30 DIAGNOSIS — E8721 Acute metabolic acidosis: Secondary | ICD-10-CM | POA: Diagnosis not present

## 2023-03-30 DIAGNOSIS — N179 Acute kidney failure, unspecified: Secondary | ICD-10-CM | POA: Diagnosis not present

## 2023-03-30 DIAGNOSIS — J441 Chronic obstructive pulmonary disease with (acute) exacerbation: Secondary | ICD-10-CM | POA: Diagnosis not present

## 2023-03-30 LAB — BASIC METABOLIC PANEL
Anion gap: 10 (ref 5–15)
Anion gap: 11 (ref 5–15)
BUN: 92 mg/dL — ABNORMAL HIGH (ref 8–23)
BUN: 90 mg/dL — ABNORMAL HIGH (ref 8–23)
CO2: 21 mmol/L — ABNORMAL LOW (ref 22–32)
CO2: 22 mmol/L (ref 22–32)
Calcium: 7.5 mg/dL — ABNORMAL LOW (ref 8.9–10.3)
Calcium: 7.5 mg/dL — ABNORMAL LOW (ref 8.9–10.3)
Chloride: 96 mmol/L — ABNORMAL LOW (ref 98–111)
Chloride: 93 mmol/L — ABNORMAL LOW (ref 98–111)
Creatinine, Ser: 2.69 mg/dL — ABNORMAL HIGH (ref 0.44–1.00)
Creatinine, Ser: 2.75 mg/dL — ABNORMAL HIGH (ref 0.44–1.00)
GFR, Estimated: 17 mL/min — ABNORMAL LOW (ref >60)
GFR, Estimated: 16 mL/min — ABNORMAL LOW (ref >60)
Glucose, Bld: 224 mg/dL — ABNORMAL HIGH (ref 70–99)
Glucose, Bld: 298 mg/dL — ABNORMAL HIGH (ref 70–99)
Potassium: 3.6 mmol/L (ref 3.5–5.1)
Potassium: 3.6 mmol/L (ref 3.5–5.1)
Sodium: 127 mmol/L — ABNORMAL LOW (ref 135–145)
Sodium: 126 mmol/L — ABNORMAL LOW (ref 135–145)

## 2023-03-30 LAB — APTT
aPTT: 176 seconds (ref 24–36)
aPTT: 197 seconds (ref 24–36)
aPTT: 94 seconds — ABNORMAL HIGH (ref 24–36)
aPTT: 77 seconds — ABNORMAL HIGH (ref 24–36)

## 2023-03-30 LAB — COOXEMETRY PANEL
Carboxyhemoglobin: 0.7 % (ref 0.5–1.5)
Carboxyhemoglobin: 0.9 % (ref 0.5–1.5)
Carboxyhemoglobin: <0.3 % — ABNORMAL LOW (ref 0.5–1.5)
Methemoglobin: 1 % (ref 0.0–1.5)
Methemoglobin: 1.4 % (ref 0.0–1.5)
Methemoglobin: <0.7 % (ref 0.0–1.5)
O2 Saturation: 55.4 %
O2 Saturation: 64.8 %
O2 Saturation: 65.7 %
Total hemoglobin: 8.9 g/dL — ABNORMAL LOW (ref 12.0–16.0)
Total hemoglobin: 9.4 g/dL — ABNORMAL LOW (ref 12.0–16.0)
Total hemoglobin: 9.9 g/dL — ABNORMAL LOW (ref 12.0–16.0)
Total oxygen content: 54.4 %
Total oxygen content: 64.1 %
Total oxygen content: 64.7 %

## 2023-03-30 LAB — GLUCOSE, CAPILLARY
Glucose-Capillary: 180 mg/dL — ABNORMAL HIGH (ref 70–99)
Glucose-Capillary: 168 mg/dL — ABNORMAL HIGH (ref 70–99)
Glucose-Capillary: 297 mg/dL — ABNORMAL HIGH (ref 70–99)
Glucose-Capillary: 251 mg/dL — ABNORMAL HIGH (ref 70–99)

## 2023-03-30 LAB — MAGNESIUM: Magnesium: 2.2 mg/dL (ref 1.7–2.4)

## 2023-03-30 LAB — LEGIONELLA PNEUMOPHILA SEROGP 1 UR AG: L. pneumophila Serogp 1 Ur Ag: NEGATIVE

## 2023-03-30 LAB — GROUP A STREP BY PCR: Group A Strep by PCR: NOT DETECTED

## 2023-03-30 LAB — HEPARIN LEVEL (UNFRACTIONATED): Heparin Unfractionated: 0.98 IU/mL — ABNORMAL HIGH (ref 0.30–0.70)

## 2023-03-30 MED ORDER — INSULIN ASPART 100 UNIT/ML IJ SOLN
0.0000 [IU] | Freq: Every day | INTRAMUSCULAR | Status: DC
  Administered 2023-03-30 – 2023-04-01 (×3): 3 [IU] via SUBCUTANEOUS
  Administered 2023-04-02: 2 [IU] via SUBCUTANEOUS
  Administered 2023-04-07: 4 [IU] via SUBCUTANEOUS
  Administered 2023-04-11: 0 [IU] via SUBCUTANEOUS
  Filled 2023-03-30 (×6): qty 1

## 2023-03-30 MED ORDER — CHLORHEXIDINE GLUCONATE CLOTH 2 % EX PADS
6.0000 | MEDICATED_PAD | Freq: Every day | CUTANEOUS | Status: DC
  Administered 2023-03-31 – 2023-04-20 (×20): 6 via TOPICAL

## 2023-03-30 MED ORDER — INSULIN GLARGINE-YFGN 100 UNIT/ML ~~LOC~~ SOLN
15.0000 [IU] | Freq: Every day | SUBCUTANEOUS | Status: DC
  Administered 2023-03-30 – 2023-04-03 (×4): 15 [IU] via SUBCUTANEOUS
  Filled 2023-03-30 (×6): qty 0.15

## 2023-03-30 MED ORDER — ASPIRIN 81 MG PO TBEC
81.0000 mg | DELAYED_RELEASE_TABLET | Freq: Every day | ORAL | Status: DC
  Administered 2023-03-30 – 2023-04-20 (×22): 81 mg via ORAL
  Filled 2023-03-30 (×22): qty 1

## 2023-03-30 MED ORDER — HEPARIN (PORCINE) 25000 UT/250ML-% IV SOLN
750.0000 [IU]/h | INTRAVENOUS | Status: DC
  Administered 2023-03-30 (×2): 800 [IU]/h via INTRAVENOUS
  Administered 2023-04-01: 750 [IU]/h via INTRAVENOUS
  Filled 2023-03-30 (×2): qty 250

## 2023-03-30 MED ORDER — PHENOL 1.4 % MT LIQD: 1.0000 | OROMUCOSAL | Status: DC | PRN

## 2023-03-30 MED ORDER — ASPIRIN 81 MG PO TBEC: 81.0000 mg | DELAYED_RELEASE_TABLET | Freq: Every day | ORAL | Status: DC

## 2023-03-30 MED ORDER — INSULIN GLARGINE-YFGN 100 UNIT/ML ~~LOC~~ SOLN
10.0000 [IU] | Freq: Every day | SUBCUTANEOUS | Status: DC
  Filled 2023-03-30: qty 0.1

## 2023-03-30 MED ORDER — INSULIN ASPART 100 UNIT/ML IJ SOLN
0.0000 [IU] | Freq: Three times a day (TID) | INTRAMUSCULAR | Status: DC
  Administered 2023-03-31: 8 [IU] via SUBCUTANEOUS
  Administered 2023-03-31: 15 [IU] via SUBCUTANEOUS
  Administered 2023-04-01 (×2): 5 [IU] via SUBCUTANEOUS
  Administered 2023-04-02 (×2): 2 [IU] via SUBCUTANEOUS
  Administered 2023-04-02: 3 [IU] via SUBCUTANEOUS
  Administered 2023-04-03: 2 [IU] via SUBCUTANEOUS
  Administered 2023-04-04: 3 [IU] via SUBCUTANEOUS
  Administered 2023-04-05: 2 [IU] via SUBCUTANEOUS
  Administered 2023-04-05: 3 [IU] via SUBCUTANEOUS
  Administered 2023-04-06: 2 [IU] via SUBCUTANEOUS
  Administered 2023-04-06 – 2023-04-07 (×2): 5 [IU] via SUBCUTANEOUS
  Administered 2023-04-07: 11 [IU] via SUBCUTANEOUS
  Administered 2023-04-08: 5 [IU] via SUBCUTANEOUS
  Administered 2023-04-08: 2 [IU] via SUBCUTANEOUS
  Administered 2023-04-09: 3 [IU] via SUBCUTANEOUS
  Administered 2023-04-09: 2 [IU] via SUBCUTANEOUS
  Administered 2023-04-10: 3 [IU] via SUBCUTANEOUS
  Administered 2023-04-11 – 2023-04-12 (×3): 2 [IU] via SUBCUTANEOUS
  Administered 2023-04-13: 3 [IU] via SUBCUTANEOUS
  Administered 2023-04-13 – 2023-04-15 (×3): 2 [IU] via SUBCUTANEOUS
  Administered 2023-04-15 – 2023-04-17 (×5): 3 [IU] via SUBCUTANEOUS
  Administered 2023-04-17: 2 [IU] via SUBCUTANEOUS
  Administered 2023-04-18 – 2023-04-20 (×5): 3 [IU] via SUBCUTANEOUS
  Filled 2023-03-30 (×40): qty 1

## 2023-03-30 NOTE — Progress Notes (Signed)
[facility]  ROUNDING NOTE   Subjective:   Jill Hudson was admitted to [facility] on 03/25/2023 for COPD exacerbation [facility]) [J44.1] Chest pain [R07.9] Community acquired pneumonia, unspecified laterality [J18.9]  Moved to ICU. Placed on milrinone and furosemide gtt.   Daughter and son-in-law at bedside.   Patient states she is breathing better.   Objective:  Vital signs in last 24 hours:  Temp:  [97.6 F (36.4 C)-98.4 F (36.9 C)] 98 F (36.7 C) (06/04 0800) Pulse Rate:  [63-105] 65 (06/04 1300) Resp:  [11-23] 13 (06/04 1300) BP: (99-133)/(10-83) 99/56 (06/04 1300) SpO2:  [94 %-100 %] 96 % (06/04 1300) Weight:  [61.1 kg-61.3 kg] 61.1 kg (06/04 0427)  Weight change: 1.7 kg Filed Weights   03/29/23 0600 03/29/23 1458 03/30/23 0427  Weight: 59.6 kg 61.3 kg 61.1 kg    Intake/Output: I/O last 3 completed shifts: In: 743.2 [P.O.:480; I.V.:263.2] Out: 1680 [Urine:1680]   Intake/Output this shift:  Total I/O In: 83.7 [I.V.:83.7] Out: 200 [Urine:200]  Physical Exam: General: NAD, sitting in chair  Head: Normocephalic, atraumatic. Moist oral mucosal membranes  Eyes: Anicteric  Lungs:  Basilar crackles L>R  Heart: Regular rate and rhythm  Abdomen:  Soft, nontender,   Extremities:  no peripheral edema.  Neurologic: Nonfocal, moving all four extremities  Skin: No lesions        Basic Metabolic Panel: Recent Labs  Lab 03/27/23 0437 03/28/23 0531 03/29/23 0412 03/29/23 2315 03/30/23 0345  NA 126* 125* 127* 127* 127*  K 4.4 3.9 3.7 4.0 3.6  CL 97* 94* 95* 93* 96*  CO2 15* 18* 21* 19* 21*  GLUCOSE 151* 218* 118* 241* 224*  BUN 60* 76* 87* 92* 92*  CREATININE 2.59* 2.84* 2.89* 2.79* 2.69*  CALCIUM 8.4* 7.9* 7.7* 7.8* 7.5*  MG 2.1 2.1  --   --  2.2  PHOS  --  5.9*  --   --   --      Liver Function Tests: Recent Labs  Lab 03/25/23 1001 03/29/23 1612  AST 22  --   ALT 16  --   ALKPHOS 73  --   BILITOT 0.6  --   PROT 6.2* 5.3*  ALBUMIN 3.5   --     No results for input(s): "LIPASE", "AMYLASE" in the last 168 hours. No results for input(s): "AMMONIA" in the last 168 hours.  CBC: Recent Labs  Lab 03/26/23 0444 03/27/23 0437 03/28/23 0531 03/29/23 0412 03/29/23 2315  WBC 20.7* 21.9* 15.2* 15.6* 11.9*  HGB 10.3* 9.5* 9.4* 9.7* 9.3*  HCT 31.7* 28.8* 28.8* 29.4* 27.8*  MCV 87.6 88.3 87.0 86.2 86.6  PLT 264 235 225 242 228     Cardiac Enzymes: No results for input(s): "CKTOTAL", "CKMB", "CKMBINDEX", "TROPONINI" in the last 168 hours.  BNP: Invalid input(s): "POCBNP"  CBG: Recent Labs  Lab 03/29/23 1214 03/29/23 1647 03/29/23 2339 03/30/23 0727 03/30/23 1122  GLUCAP 138* 145* 226* 180* 168*     Microbiology: Results for orders placed or performed during the hospital encounter of 03/25/23  Blood Culture (routine x 2)     Status: None   Collection Time: 03/25/23 12:09 PM   Specimen: BLOOD  Result Value Ref Range Status   Specimen Description BLOOD LEFT [facility]  Final   Special Requests   Final    BOTTLES DRAWN AEROBIC AND ANAEROBIC Blood Culture adequate volume   Culture   Final    NO GROWTH 5 DAYS Performed at [facility], [address], [place], [place] [address]    Report Status 03/30/2023 FINAL  Final  Blood Culture (routine x 2)     Status: None   Collection Time: 03/25/23 12:10 PM   Specimen: BLOOD  Result Value Ref Range Status   Specimen Description BLOOD LEFT FA  Final   Special Requests   Final    BOTTLES DRAWN AEROBIC AND ANAEROBIC Blood Culture adequate volume   Culture   Final    NO GROWTH 5 DAYS Performed at [facility], [address] [place], [place] [address]    Report Status 03/30/2023 FINAL  Final  SARS Coronavirus 2 by RT PCR (hospital order, performed in [facility] hospital lab) *cepheid single result test* Anterior Nasal Swab     Status: None   Collection Time: 03/25/23  1:18 PM   Specimen: Anterior Nasal Swab  Result Value Ref Range Status   SARS  Coronavirus 2 by RT PCR NEGATIVE NEGATIVE Final    Comment: (NOTE) SARS-CoV-2 target nucleic acids are NOT DETECTED.  The SARS-CoV-2 RNA is generally detectable in upper and lower respiratory specimens during the acute phase of infection. The lowest concentration of SARS-CoV-2 viral copies this assay can detect is 250 copies / mL. A negative result does not preclude SARS-CoV-2 infection and should not be used as the sole basis for treatment or other patient management decisions.  A negative result may occur with improper specimen collection / handling, submission of specimen other than nasopharyngeal swab, presence of viral mutation(s) within the areas targeted by this assay, and inadequate number of viral copies (<250 copies / mL). A negative result must be combined with clinical observations, patient history, and epidemiological information.  Fact Sheet for Patients:   RoadLapTop.co.za  Fact Sheet for Healthcare Providers: http://kim-miller.com/  This test is not yet approved or  cleared by the [place] FDA and has been authorized for detection and/or diagnosis of SARS-CoV-2 by FDA under an Emergency Use Authorization (EUA).  This EUA will remain in effect (meaning this test can be used) for the duration of the COVID-19 declaration under Section 564(b)(1) of the Act, 21 U.S.C. section 360bbb-3(b)(1), unless the authorization is terminated or revoked sooner.  Performed at [facility], [address] [place], [place] [address]   MRSA Next Gen by PCR, Nasal     Status: None   Collection Time: 03/27/23  4:40 AM   Specimen: Nasal Mucosa; Nasal Swab  Result Value Ref Range Status   MRSA by PCR Next Gen NOT DETECTED NOT DETECTED Final    Comment: (NOTE) The GeneXpert MRSA Assay (FDA approved for NASAL specimens only), is one component of a comprehensive MRSA colonization surveillance program. It is not intended to  diagnose MRSA infection nor to guide or monitor treatment for MRSA infections. Test performance is not FDA approved in patients less than 41 years old. Performed at [facility], [address] [place], [place] [address]   Respiratory (~20 pathogens) panel by PCR     Status: None   Collection Time: 03/28/23  4:00 PM   Specimen: Nasopharyngeal Swab; Respiratory  Result Value Ref Range Status   Adenovirus NOT DETECTED NOT DETECTED Final   Coronavirus 229E NOT DETECTED NOT DETECTED Final    Comment: (NOTE) The Coronavirus on the Respiratory Panel, DOES NOT test for the novel  Coronavirus (2019 nCoV)    Coronavirus HKU1 NOT DETECTED NOT DETECTED Final   Coronavirus NL63 NOT DETECTED NOT DETECTED Final   Coronavirus OC43  NOT DETECTED NOT DETECTED Final   Metapneumovirus NOT DETECTED NOT DETECTED Final   Rhinovirus / Enterovirus NOT DETECTED NOT DETECTED Final   Influenza A NOT DETECTED NOT DETECTED Final   Influenza B NOT DETECTED NOT DETECTED Final   Parainfluenza Virus 1 NOT DETECTED NOT DETECTED Final   Parainfluenza Virus 2 NOT DETECTED NOT DETECTED Final   Parainfluenza Virus 3 NOT DETECTED NOT DETECTED Final   Parainfluenza Virus 4 NOT DETECTED NOT DETECTED Final   Respiratory Syncytial Virus NOT DETECTED NOT DETECTED Final   Bordetella pertussis NOT DETECTED NOT DETECTED Final   Bordetella Parapertussis NOT DETECTED NOT DETECTED Final   Chlamydophila pneumoniae NOT DETECTED NOT DETECTED Final   Mycoplasma pneumoniae NOT DETECTED NOT DETECTED Final    Comment: Performed at [facility], [address], [place], [place] [address]  Body fluid culture w Gram Stain     Status: None (Preliminary result)   Collection Time: 03/29/23 10:27 AM   Specimen: PATH Cytology Pleural fluid  Result Value Ref Range Status   Specimen Description   Final    PLEURAL Performed at [facility], [address] [place], [place] [address]    Special Requests   Final     NONE Performed at [facility], [address] [place], [place] [address]    Gram Stain NO WBC SEEN NO ORGANISMS SEEN   Final   Culture   Final    NO GROWTH < 24 HOURS Performed at [facility], [address], [place], [place] [address]    Report Status PENDING  Incomplete  MRSA Next Gen by PCR, Nasal     Status: None   Collection Time: 03/29/23  3:00 PM   Specimen: Nasal Mucosa; Nasal Swab  Result Value Ref Range Status   MRSA by PCR Next Gen NOT DETECTED NOT DETECTED Final    Comment: (NOTE) The GeneXpert MRSA Assay (FDA approved for NASAL specimens only), is one component of a comprehensive MRSA colonization surveillance program. It is not intended to diagnose MRSA infection nor to guide or monitor treatment for MRSA infections. Test performance is not FDA approved in patients less than 39 years old. Performed at [facility], [address] [place], [place] [address]     Coagulation Studies: No results for input(s): "LABPROT", "INR" in the last 72 hours.   Urinalysis: No results for input(s): "COLORURINE", "LABSPEC", "PHURINE", "GLUCOSEU", "HGBUR", "BILIRUBINUR", "KETONESUR", "PROTEINUR", "UROBILINOGEN", "NITRITE", "LEUKOCYTESUR" in the last 72 hours.  Invalid input(s): "APPERANCEUR"    Imaging: DG Chest Port 1 View  Result Date: 03/29/2023 CLINICAL DATA:  Line placement EXAM: PORTABLE CHEST 1 VIEW COMPARISON:  X-ray 03/29/2023 FINDINGS: Calcified aorta. Stable cardiopericardial silhouette. New left IJ catheter with tip at the SVC right atrial junction region. Small pleural effusions are seen, left-greater-than-right with the adjacent opacities. No pneumothorax or edema. Overlapping cardiac leads. Osteopenia. IMPRESSION: Left IJ catheter in place.  No pneumothorax. Electronically Signed   By: Karen Kays M.D.   On: 03/29/2023 16:19   US THORACENTESIS ASP PLEURAL SPACE W/IMG GUIDE  Result Date: 03/29/2023 INDICATION: Bilateral pleural  effusions. Request received for diagnostic and therapeutic thoracentesis EXAM: ULTRASOUND GUIDED RIGHT THORACENTESIS MEDICATIONS: 4 cc 1% lidocaine COMPLICATIONS: None immediate. PROCEDURE: An ultrasound guided thoracentesis was thoroughly discussed with the patient and questions answered. The benefits, risks, alternatives and complications were also discussed. The patient understands and wishes to proceed with the procedure. Written consent was obtained. Ultrasound was performed to localize  and mark an adequate pocket of fluid in the right chest. The area was then prepped and draped in the normal sterile fashion. 1% Lidocaine was used for local anesthesia. Under ultrasound guidance a 6 Fr Safe-T-Centesis catheter was introduced. Thoracentesis was performed. The catheter was removed and a dressing applied. FINDINGS: A total of approximately 700 mL of pale yellow fluid was removed. Samples were sent to the laboratory as requested by the clinical team. IMPRESSION: Successful ultrasound guided right thoracentesis yielding 700 mL of pleural fluid. Follow-up chest x-ray revealed no evidence of pneumothorax. Procedure performed by Mina Marble, PA-C Electronically Signed   By: Malachy Moan M.D.   On: 03/29/2023 13:25   DG Chest Port 1 View  Result Date: 03/29/2023 CLINICAL DATA:  142230 Pleural effusion 142230 EXAM: PORTABLE CHEST - 1 VIEW COMPARISON:  03/25/2023 FINDINGS: New moderate left pleural effusion with adjacent opacities in the left lower lung. Right lung remains clear. Heart size upper limits normal. Aortic Atherosclerosis (ICD10-170.0). No pneumothorax. Visualized bones unremarkable. IMPRESSION: New moderate left pleural effusion. Electronically Signed   By: Corlis Leak M.D.   On: 03/29/2023 10:48     Medications:    furosemide (LASIX) 200 mg in dextrose 5 % 100 mL (2 mg/mL) infusion 20 mg/hr (03/30/23 1000)   heparin 800 Units/hr (03/30/23 1447)   milrinone 0.25 mcg/kg/min (03/30/23 1000)    norepinephrine (LEVOPHED) Adult infusion      arformoterol  15 mcg Nebulization BID   Chlorhexidine Gluconate Cloth  6 each Topical Daily   cyanocobalamin  1,000 mcg Oral Daily   feeding supplement (GLUCERNA SHAKE)  237 mL Oral TID BM   gabapentin  100 mg Oral QHS   insulin aspart  0-9 Units Subcutaneous TID WC   insulin glargine-yfgn  7 Units Subcutaneous QHS   multivitamin  1 tablet Oral BID   promethazine  6.25 mg Oral QHS   revefenacin  175 mcg Nebulization Daily   rosuvastatin  10 mg Oral Once per day on Mon Wed Fri Sat   sodium chloride  1 g Oral BID WC   acetaminophen, alum & mag hydroxide-simeth, dextromethorphan-guaiFENesin, hydrALAZINE, levalbuterol, melatonin, ondansetron (ZOFRAN) IV, mouth rinse, oxyCODONE, senna-docusate, traZODone  Assessment/ Plan:  Ms. ADREANNA MANS is a 88 y.o.  female with diabetes mellitus type II, hypertension, coronary artery disease, peripheral arterial disease, tremor, CVA, congestive heart failure and anemia who is admitted to [facility] on 03/25/2023 for COPD exacerbation (HCC) [J44.1] Chest pain [R07.9] Community acquired pneumonia, unspecified laterality [J18.9]  Acute kidney injury with acute metabolic acidosis on chronic kidney disease stage IIIB: baseline creatinine of 1.48, GFR of 34 on 12/09/2022.   Hypertension with chronic kidney disease: with chronic systolic and diastolic congestive heart failure.   Hyponatremia: -Sodium slowly improving, 127   LOS: 5 Misha Vanoverbeke 6/4/20242:51 PM

## 2023-03-30 NOTE — Progress Notes (Signed)
Physical Therapy Treatment Patient Details Name: Jill Hudson MRN: [id number] DOB: 01/16/1935 Today's Date: 03/30/2023   History of Present Illness presented to ER secondary to progressive SOB; admitted for management of A/C CHF exacerbation, AECOPD.  s/p thoracentesis (6/3) with removal of fluid; pending cardiac cath and TEE    PT Comments    Patient received in bed, trying to get set up for lunch. Family at bedside. She is agreeable to get out of bed to recliner to eat. Patient required mod A to get from supine to sit with HOB elevated. She is able to sit unsupported once positioned. Patient is able to stand with min A +2 and take a few pivoting steps to recliner with RW. Patient will continue to benefit from skilled PT to improve strength, safety and independence with mobility.      Recommendations for follow up therapy are one component of a multi-disciplinary discharge planning process, led by the attending physician.  Recommendations may be updated based on patient status, additional functional criteria and insurance authorization.  Follow Up Recommendations  Can patient physically be transported by private vehicle: No    Assistance Recommended at Discharge Frequent or constant Supervision/Assistance  Patient can return home with the following A lot of help with walking and/or transfers;A little help with bathing/dressing/bathroom;Assist for transportation   Equipment Recommendations  Rolling walker (2 wheels)    Recommendations for Other Services       Precautions / Restrictions Precautions Precautions: Fall Restrictions Weight Bearing Restrictions: No     Mobility  Bed Mobility Overal bed mobility: Needs Assistance Bed Mobility: Supine to Sit     Supine to sit: Mod assist     General bed mobility comments: mod A to raise trunk to seated position and get scooted out to edge of bed    Transfers Overall transfer level: Needs assistance Equipment used: Rolling  walker (2 wheels) Transfers: Sit to/from Stand Sit to Stand: Min assist, +2 physical assistance   Step pivot transfers: Min assist, +2 physical assistance       General transfer comment: she is weak, but able to step pivot to recliner.    Ambulation/Gait               General Gait Details: deferred due to SOB/fatigue   Stairs             Wheelchair Mobility    Modified Rankin (Stroke Patients Only)       Balance Overall balance assessment: Needs assistance Sitting-balance support: Feet unsupported Sitting balance-Leahy Scale: Fair     Standing balance support: Bilateral upper extremity supported, During functional activity, Reliant on assistive device for balance Standing balance-Leahy Scale: Fair Standing balance comment: Hand held assistance or RW to maintain balance while standing.                            Cognition Arousal/Alertness: Awake/alert Behavior During Therapy: WFL for tasks assessed/performed Overall Cognitive Status: Within Functional Limits for tasks assessed                                          Exercises      General Comments        Pertinent Vitals/Pain Pain Assessment Pain Assessment: No/denies pain    Home Living  Prior Function            PT Goals (current goals can now be found in the care plan section) Acute Rehab PT Goals Patient Stated Goal: to get stronger and go home PT Goal Formulation: With patient Time For Goal Achievement: 04/12/23 Potential to Achieve Goals: Good Progress towards PT goals: Progressing toward goals    Frequency    Min 3X/week      PT Plan Current plan remains appropriate    Co-evaluation              AM-PAC PT "6 Clicks" Mobility   Outcome Measure  Help needed turning from your back to your side while in a flat bed without using bedrails?: A Lot Help needed moving from lying on your back to sitting on  the side of a flat bed without using bedrails?: A Lot Help needed moving to and from a bed to a chair (including a wheelchair)?: A Lot Help needed standing up from a chair using your arms (e.g., wheelchair or bedside chair)?: A Little Help needed to walk in hospital room?: A Lot Help needed climbing 3-5 steps with a railing? : A Lot 6 Click Score: 13    End of Session Equipment Utilized During Treatment: Oxygen Activity Tolerance: Patient limited by fatigue Patient left: in chair;with call bell/phone within reach;with family/visitor present Nurse Communication: Mobility status PT Visit Diagnosis: Muscle weakness (generalized) (M62.81);Difficulty in walking, not elsewhere classified (R26.2)     Time: 1610-9604 PT Time Calculation (min) (ACUTE ONLY): 17 min  Charges:  $Therapeutic Activity: 8-22 mins                     Bevan Vu, PT, GCS 03/30/23,1:43 PM

## 2023-03-30 NOTE — Progress Notes (Signed)
ANTICOAGULATION CONSULT NOTE  Pharmacy Consult for heparin infusion Indication: ACS/STEMI/ apixaban for Afib PTA  Allergies  Allergen Reactions   Cefdinir Diarrhea   Saxagliptin Diarrhea   Epinephrine Other (See Comments)    Patient does not remember what happens when she uses this   Evolocumab     Pain with injection   Atorvastatin Other (See Comments)    Muscle aches   Codeine Other (See Comments)    Upset stomach   Ezetimibe Other (See Comments)    Myalgias(ZETIA)   Limonene Rash    Patient does not recall this reaction   Nitrofurantoin Rash and Other (See Comments)    Pruitus   Sulfa Antibiotics Rash and Other (See Comments)    Sore mouth     Patient Measurements: Height: 5' (152.4 cm) Weight: 61.1 kg (134 lb 11.2 oz) IBW/kg (Calculated) : 45.5 Heparin Dosing Weight: 56.2 kg  Vital Signs: Temp: 98.4 F (36.9 C) (06/04 0000) Temp Source: Oral (06/04 0000) BP: 119/42 (06/04 0400) Pulse Rate: 70 (06/04 0427)  Labs: Recent Labs    03/28/23 0531 03/29/23 0412 03/29/23 1612 03/29/23 2315 03/29/23 2317 03/30/23 0345  HGB 9.4* 9.7*  --  9.3*  --   --   HCT 28.8* 29.4*  --  27.8*  --   --   PLT 225 242  --  228  --   --   APTT 82* 58* 40* 176*  --  197*  HEPARINUNFRC >1.10* 0.68  --   --  0.94* 0.98*  CREATININE 2.84* 2.89*  --  2.79*  --  2.69*     Estimated Creatinine Clearance: 12.3 mL/min (A) (by C-G formula based on SCr of 2.69 mg/dL (H)).   Medical History: Past Medical History:  Diagnosis Date   Acute CHF (congestive heart failure) (HCC) 02/18/2020   Acute diastolic CHF (congestive heart failure) (HCC)    Acute kidney injury superimposed on CKD (HCC)    Allergies    Anxiety    Arthritis    spine and shoulder   Atherosclerosis of artery of extremity with rest pain (HCC) 10/12/2018   Cancer (HCC)    skin   CHF (congestive heart failure) (HCC)    Diabetes mellitus without complication (HCC)    Diverticulosis of large intestine with hemorrhage     GI bleeding 12/25/2019   Heart murmur    Hyperlipidemia    Hypertension    Macula lutea degeneration    Mitral and aortic valve disease    Myocardial infarction (HCC)    may have had a "light" heart attack   Non-ST elevation (NSTEMI) myocardial infarction (HCC)    Occasional tremors    PAD (peripheral artery disease) (HCC)    Rectal bleeding    Shingles    patient unaware but daughter confirms. it was a long time ago   Stroke [facility]) 01/2017   may have had a slight stroke   TIA (transient ischemic attack) 01/2017   UTI (urinary tract infection)    Vascular disease, peripheral (HCC)     Medications:  PTA Meds: Apixaban 2.5 mg BID, last dose 5/30 @ 1856  Assessment: Pt is a 88 yo female initially presenting to ED w/ COPD exacerbation, now c/o increase WOB , found with increasing Troponin I levels.  5/31 1428 aPTT=95 Therapeutic x1 5/31 2228 aPTT=120, supratherapeutic 6/1   0802 aPTT=68/HL>1.10   aPTT therapeuticx1 6/1   1358 aPTT= 89  Therapeuticx2 6/2   0531 aPTT = 82, therapeutic x 3 /  HL > 1.1, not correlating 6/3   0412 aPTT = 58, subtherapeutic / HL 0.68, not correlating 6/3   1612 aPTT = 40, subtherapeutic 6/3   2315 aPTT = 176,  HL = 0.94 - possible sampling error 6/4   0345 aPTT = 197,  HL = 0.98 - elevated    Goal of Therapy:  Heparin level 0.3-0.7 units/ml aPTT 66-102 seconds Monitor platelets by anticoagulation protocol: Yes   Plan:  6/3 @ 2315:  aPTT = 176,  HL = 0.94 - HL elevated from Eliquis PTA,   aPTT greatly elevated from previous level (4 X previous level) so suspect sampling error since this was drawn from central line by RN - Will order STAT repeat of previous levels   6/4 @ 0345: aPTT = 197,   HL = 0.98 - repeat aPTT is elevated so will take this as valid  - Will hold heparin infusion for 1 hr and restart @ 0600 at 800 units/hr. - Will recheck aPTT 8 hrs after restart - Will restart HL on 6/5 with AM labs.   Will follow aPTT until correlation  w/ HL confirmed.  HL & CBC daily while on heparin  Eisley Barber D, PharmD Clinical Pharmacist 03/30/2023 5:04 AM

## 2023-03-30 NOTE — Progress Notes (Signed)
PROGRESS NOTE    Jill Hudson  XBJ:[id number] DOB: [birth year] DOA: 03/25/2023 PCP: Reubin Milan, MD   Brief Narrative:  This 88 year old female with history of COPD, HTN, HLD, DM2, stroke, TIA, GERD, anxiety, PVD, CAD, MI, former tobacco use, CKD stage III, GI bleed, iron deficiency anemia, PAD status post PCI comes to the hospital with shortness of breath and hypoxia. Patient was found to have bilateral pulmonary edema /effusion with elevated BNP.  Patient is admitted for further evaluation.  Cardiology is consulted.  Patient will eventually need right and left heart cath pending improvement in renal functions.  Assessment & Plan:   Principal Problem:   COPD exacerbation (HCC) Active Problems:   Severe sepsis (HCC)   Acute on chronic diastolic CHF (congestive heart failure) (HCC)   Essential hypertension   History of CVA (cerebrovascular accident)   CAD (coronary artery disease)   Myocardial injury   Atrial fibrillation with RVR (HCC)   Type II diabetes mellitus with renal manifestations (HCC)   Chronic kidney disease, stage 3a (HCC)   Hyponatremia   HLD (hyperlipidemia)   Iron deficiency anemia   PAD (peripheral artery disease) (HCC)   AKI (acute kidney injury) (HCC)   Acute respiratory failure with hypoxia (HCC)   Acute combined systolic and diastolic heart failure (HCC)   Chest pain   Cardiogenic shock (HCC)  Acute systolic CHF: Acute on chronic diastolic CHF:  Class III Previous echocardiogram in March 2023 showed LVEF of 55%.   Now has clinical evidence of bilateral pulmonary edema / effusion and elevated BNP.   Repeat echo shows LVEF 30 to 35%. Thinking this is ischemic cardiomyopathy (see nstemi below) Cardiology consulted.  Patient will need left and right heart catheterization.  She will also need TEE to better evaluate her mitral valve.  She is not a candidate for catheterization at this time due to worsening renal functions.  Now has central line to monitor  central venous pressures On lasix and milronone Advanced heart failure team guiding this care Palliative and pt/ot consulted   NSTEMI: History of CAD With elevated troponin,  Not a candidate for catheterization at this time due to worsening renal functions. On heparin gtt   Hyponatremia: Suspect from chf, aki on ckd Continue to monitor, some improvement today, corrects to 130 Treat underlying problems Is on salt tabs   Acute kidney injury on CKD stage III A: Baseline creatinine 1.3.  Today is 2.75, stable from yesterday. Closely monitor.     Metabolic acidosis Improving, now off bicarb gtt   COPD exacerbation: CAP Pleural effusions Initially wheezing now resolved. Blood cultures neg. Asymetric opacities seen on cxr. Covid and rvp neg. Thoracentesis on 6/3 consistent with transudate from chf. Treated with 5-day course ceftriaxone/azithromycin  Pharyngitis New today, normal appearing pharynx - strep naat - symptom mgmt   Essential hypertension: Continue amlodipine.  IV as needed.   History of CVA (cerebrovascular accident) Cont statin   Atrial fibrillation with RVR (HCC) History of paroxysmal atrial fibrillation Continue metoprolol and heparin drip.   Type II diabetes mellitus with renal manifestations [facility]):  Recent A1c 7.9.  glucose elevated - continue insulin   HLD (hyperlipidemia) Continue Crestor   Iron deficiency anemia: Hemoglobin 10.9 (11.2 on 03/24/2022) iron supplement   PAD (peripheral artery disease) (HCC): S/p of a stent placement to the left leg -Patient is on Crestor and Eliquis at home     DVT prophylaxis: Heparin drip Code Status: Full code Family Communication: son (who is  a neurologist) updated telephonically 6/4 Disposition Plan:     Status is: Inpatient Remains inpatient appropriate because: Acutely ill , continue hospital stay due to multiple medical issues.   Consultants:  Cardiology  Procedures: None  Antimicrobials:   Anti-infectives (From admission, onward)    Start     Dose/Rate Route Frequency Ordered Stop   03/28/23 1200  cefTRIAXone (ROCEPHIN) 1 g in sodium chloride 0.9 % 100 mL IVPB        1 g 200 mL/hr over 30 Minutes Intravenous Every 24 hours 03/28/23 0945 03/29/23 1349   03/26/23 1600  vancomycin (VANCOREADY) IVPB 1250 mg/250 mL        1,250 mg 166.7 mL/hr over 90 Minutes Intravenous  Once 03/26/23 1452 03/26/23 1838   03/26/23 1452  vancomycin variable dose per unstable renal function (pharmacist dosing)  Status:  Discontinued         Does not apply See admin instructions 03/26/23 1452 03/27/23 1335   03/25/23 1145  cefTRIAXone (ROCEPHIN) 2 g in sodium chloride 0.9 % 100 mL IVPB  Status:  Discontinued        2 g 200 mL/hr over 30 Minutes Intravenous Every 24 hours 03/25/23 1135 03/28/23 0945   03/25/23 1145  azithromycin (ZITHROMAX) 500 mg in sodium chloride 0.9 % 250 mL IVPB        500 mg 250 mL/hr over 60 Minutes Intravenous Every 24 hours 03/25/23 1135 03/29/23 1522      Subjective: Patient seen and examined at bedside. Breathing stable, denies pain, complaining of mild throat pain  Objective: Vitals:   03/30/23 1400 03/30/23 1500 03/30/23 1600 03/30/23 1645  BP: (!) 130/95 111/66 (!) 110/51   Pulse: 69 67 67 68  Resp: 19 18 16 14   Temp:   98 F (36.7 C)   TempSrc:   Oral   SpO2: 93% 99% 99% 98%  Weight:      Height:        Intake/Output Summary (Last 24 hours) at 03/30/2023 1721 Last data filed at 03/30/2023 1600 Gross per 24 hour  Intake 1157.07 ml  Output 1470 ml  Net -312.93 ml   Filed Weights   03/29/23 0600 03/29/23 1458 03/30/23 0427  Weight: 59.6 kg 61.3 kg 61.1 kg    Examination:  General exam: Appears calm and comfortable, deconditioned, not in any acute distress. ENT: pharynx normal Respiratory system: Clear to auscultation. Respiratory effort normal.  RR 15 Cardiovascular system: distant heart sounds Gastrointestinal system: Abdomen is soft, non  tender, non distended, bowel sounds+ Central nervous system: Alert, a little confused Extremities: mild pitting LE edema to knees Skin: No rashes, lesions or ulcers Psychiatry: Judgement and insight appear normal. Mood & affect appropriate.     Data Reviewed: I have personally reviewed following labs and imaging studies  CBC: Recent Labs  Lab 03/26/23 0444 03/27/23 0437 03/28/23 0531 03/29/23 0412 03/29/23 2315  WBC 20.7* 21.9* 15.2* 15.6* 11.9*  HGB 10.3* 9.5* 9.4* 9.7* 9.3*  HCT 31.7* 28.8* 28.8* 29.4* 27.8*  MCV 87.6 88.3 87.0 86.2 86.6  PLT 264 235 225 242 228   Basic Metabolic Panel: Recent Labs  Lab 03/27/23 0437 03/28/23 0531 03/29/23 0412 03/29/23 2315 03/30/23 0345 03/30/23 1423  NA 126* 125* 127* 127* 127* 126*  K 4.4 3.9 3.7 4.0 3.6 3.6  CL 97* 94* 95* 93* 96* 93*  CO2 15* 18* 21* 19* 21* 22  GLUCOSE 151* 218* 118* 241* 224* 298*  BUN 60* 76* 87* 92* 92* 90*  CREATININE 2.59* 2.84* 2.89* 2.79* 2.69* 2.75*  CALCIUM 8.4* 7.9* 7.7* 7.8* 7.5* 7.5*  MG 2.1 2.1  --   --  2.2  --   PHOS  --  5.9*  --   --   --   --    GFR: Estimated Creatinine Clearance: 12 mL/min (A) (by C-G formula based on SCr of 2.75 mg/dL (H)). Liver Function Tests: Recent Labs  Lab 03/25/23 1001 03/29/23 1612  AST 22  --   ALT 16  --   ALKPHOS 73  --   BILITOT 0.6  --   PROT 6.2* 5.3*  ALBUMIN 3.5  --    No results for input(s): "LIPASE", "AMYLASE" in the last 168 hours. No results for input(s): "AMMONIA" in the last 168 hours. Coagulation Profile: Recent Labs  Lab 03/26/23 0700  INR 1.4*   Cardiac Enzymes: No results for input(s): "CKTOTAL", "CKMB", "CKMBINDEX", "TROPONINI" in the last 168 hours. BNP (last 3 results) No results for input(s): "PROBNP" in the last 8760 hours. HbA1C: No results for input(s): "HGBA1C" in the last 72 hours.  CBG: Recent Labs  Lab 03/29/23 1647 03/29/23 2339 03/30/23 0727 03/30/23 1122 03/30/23 1536  GLUCAP 145* 226* 180* 168* 297*    Lipid Profile: No results for input(s): "CHOL", "HDL", "LDLCALC", "TRIG", "CHOLHDL", "LDLDIRECT" in the last 72 hours.  Thyroid Function Tests: No results for input(s): "TSH", "T4TOTAL", "FREET4", "T3FREE", "THYROIDAB" in the last 72 hours. Anemia Panel: No results for input(s): "VITAMINB12", "FOLATE", "FERRITIN", "TIBC", "IRON", "RETICCTPCT" in the last 72 hours. Sepsis Labs: Recent Labs  Lab 03/25/23 1209 03/25/23 1318 03/25/23 1548 03/25/23 1748 03/26/23 0444 03/26/23 0850 03/26/23 1110  PROCALCITON 0.16  --   --   --  2.84  --   --   LATICACIDVEN 2.9*   < > 5.5* 6.7*  --  2.8* 2.0*   < > = values in this interval not displayed.    Recent Results (from the past 240 hour(s))  Blood Culture (routine x 2)     Status: None   Collection Time: 03/25/23 12:09 PM   Specimen: BLOOD  Result Value Ref Range Status   Specimen Description BLOOD LEFT [facility]  Final   Special Requests   Final    BOTTLES DRAWN AEROBIC AND ANAEROBIC Blood Culture adequate volume   Culture   Final    NO GROWTH 5 DAYS Performed at [facility], [address] [place], [place] [address]    Report Status 03/30/2023 FINAL  Final  Blood Culture (routine x 2)     Status: None   Collection Time: 03/25/23 12:10 PM   Specimen: BLOOD  Result Value Ref Range Status   Specimen Description BLOOD LEFT FA  Final   Special Requests   Final    BOTTLES DRAWN AEROBIC AND ANAEROBIC Blood Culture adequate volume   Culture   Final    NO GROWTH 5 DAYS Performed at [facility], [address] [place], [place] [address]    Report Status 03/30/2023 FINAL  Final  SARS Coronavirus 2 by RT PCR (hospital order, performed in [facility] hospital lab) *cepheid single result test* Anterior Nasal Swab     Status: None   Collection Time: 03/25/23  1:18 PM   Specimen: Anterior Nasal Swab  Result Value Ref Range Status   SARS Coronavirus 2 by RT PCR NEGATIVE NEGATIVE Final    Comment: (NOTE) SARS-CoV-2 target  nucleic acids are NOT DETECTED.  The SARS-CoV-2 RNA is generally detectable  in upper and lower respiratory specimens during the acute phase of infection. The lowest concentration of SARS-CoV-2 viral copies this assay can detect is 250 copies / mL. A negative result does not preclude SARS-CoV-2 infection and should not be used as the sole basis for treatment or other patient management decisions.  A negative result may occur with improper specimen collection / handling, submission of specimen other than nasopharyngeal swab, presence of viral mutation(s) within the areas targeted by this assay, and inadequate number of viral copies (<250 copies / mL). A negative result must be combined with clinical observations, patient history, and epidemiological information.  Fact Sheet for Patients:   RoadLapTop.co.za  Fact Sheet for Healthcare Providers: http://kim-miller.com/  This test is not yet approved or  cleared by the [place] FDA and has been authorized for detection and/or diagnosis of SARS-CoV-2 by FDA under an Emergency Use Authorization (EUA).  This EUA will remain in effect (meaning this test can be used) for the duration of the COVID-19 declaration under Section 564(b)(1) of the Act, 21 U.S.C. section 360bbb-3(b)(1), unless the authorization is terminated or revoked sooner.  Performed at [facility], [address] [place], [place] [address]   MRSA Next Gen by PCR, Nasal     Status: None   Collection Time: 03/27/23  4:40 AM   Specimen: Nasal Mucosa; Nasal Swab  Result Value Ref Range Status   MRSA by PCR Next Gen NOT DETECTED NOT DETECTED Final    Comment: (NOTE) The GeneXpert MRSA Assay (FDA approved for NASAL specimens only), is one component of a comprehensive MRSA colonization surveillance program. It is not intended to diagnose MRSA infection nor to guide or monitor treatment for MRSA infections. Test  performance is not FDA approved in patients less than 73 years old. Performed at [facility], [address] [place], [place] [address]   Respiratory (~20 pathogens) panel by PCR     Status: None   Collection Time: 03/28/23  4:00 PM   Specimen: Nasopharyngeal Swab; Respiratory  Result Value Ref Range Status   Adenovirus NOT DETECTED NOT DETECTED Final   Coronavirus 229E NOT DETECTED NOT DETECTED Final    Comment: (NOTE) The Coronavirus on the Respiratory Panel, DOES NOT test for the novel  Coronavirus (2019 nCoV)    Coronavirus HKU1 NOT DETECTED NOT DETECTED Final   Coronavirus NL63 NOT DETECTED NOT DETECTED Final   Coronavirus OC43 NOT DETECTED NOT DETECTED Final   Metapneumovirus NOT DETECTED NOT DETECTED Final   Rhinovirus / Enterovirus NOT DETECTED NOT DETECTED Final   Influenza A NOT DETECTED NOT DETECTED Final   Influenza B NOT DETECTED NOT DETECTED Final   Parainfluenza Virus 1 NOT DETECTED NOT DETECTED Final   Parainfluenza Virus 2 NOT DETECTED NOT DETECTED Final   Parainfluenza Virus 3 NOT DETECTED NOT DETECTED Final   Parainfluenza Virus 4 NOT DETECTED NOT DETECTED Final   Respiratory Syncytial Virus NOT DETECTED NOT DETECTED Final   Bordetella pertussis NOT DETECTED NOT DETECTED Final   Bordetella Parapertussis NOT DETECTED NOT DETECTED Final   Chlamydophila pneumoniae NOT DETECTED NOT DETECTED Final   Mycoplasma pneumoniae NOT DETECTED NOT DETECTED Final    Comment: Performed at [facility], [address], [place], [place] [address]  Body fluid culture w Gram Stain     Status: None (Preliminary result)   Collection Time: 03/29/23 10:27 AM   Specimen: PATH Cytology Pleural fluid  Result Value Ref Range Status   Specimen Description  Final    PLEURAL Performed at [facility], [address] [place], [place] [address]    Special Requests   Final    NONE Performed at [facility], [address] [place], [place] [address]     Gram Stain NO WBC SEEN NO ORGANISMS SEEN   Final   Culture   Final    NO GROWTH < 24 HOURS Performed at [facility], [address], [place], [place] [address]    Report Status PENDING  Incomplete  MRSA Next Gen by PCR, Nasal     Status: None   Collection Time: 03/29/23  3:00 PM   Specimen: Nasal Mucosa; Nasal Swab  Result Value Ref Range Status   MRSA by PCR Next Gen NOT DETECTED NOT DETECTED Final    Comment: (NOTE) The GeneXpert MRSA Assay (FDA approved for NASAL specimens only), is one component of a comprehensive MRSA colonization surveillance program. It is not intended to diagnose MRSA infection nor to guide or monitor treatment for MRSA infections. Test performance is not FDA approved in patients less than 42 years old. Performed at [facility], [address] [place], [place] [address]          Radiology Studies: DG Chest [place] 1 View  Result Date: 03/29/2023 CLINICAL DATA:  Line placement EXAM: PORTABLE CHEST 1 VIEW COMPARISON:  X-ray 03/29/2023 FINDINGS: Calcified aorta. Stable cardiopericardial silhouette. New left IJ catheter with tip at the SVC right atrial junction region. Small pleural effusions are seen, left-greater-than-right with the adjacent opacities. No pneumothorax or edema. Overlapping cardiac leads. Osteopenia. IMPRESSION: Left IJ catheter in place.  No pneumothorax. Electronically Signed   By: Karen Kays M.D.   On: 03/29/2023 16:19   US THORACENTESIS ASP PLEURAL SPACE W/IMG GUIDE  Result Date: 03/29/2023 INDICATION: Bilateral pleural effusions. Request received for diagnostic and therapeutic thoracentesis EXAM: ULTRASOUND GUIDED RIGHT THORACENTESIS MEDICATIONS: 4 cc 1% lidocaine COMPLICATIONS: None immediate. PROCEDURE: An ultrasound guided thoracentesis was thoroughly discussed with the patient and questions answered. The benefits, risks, alternatives and complications were also discussed. The patient understands and wishes to  proceed with the procedure. Written consent was obtained. Ultrasound was performed to localize and mark an adequate pocket of fluid in the right chest. The area was then prepped and draped in the normal sterile fashion. 1% Lidocaine was used for local anesthesia. Under ultrasound guidance a 6 Fr Safe-T-Centesis catheter was introduced. Thoracentesis was performed. The catheter was removed and a dressing applied. FINDINGS: A total of approximately 700 mL of pale yellow fluid was removed. Samples were sent to the laboratory as requested by the clinical team. IMPRESSION: Successful ultrasound guided right thoracentesis yielding 700 mL of pleural fluid. Follow-up chest x-ray revealed no evidence of pneumothorax. Procedure performed by Mina Marble, PA-C Electronically Signed   By: Malachy Moan M.D.   On: 03/29/2023 13:25   DG Chest Port 1 View  Result Date: 03/29/2023 CLINICAL DATA:  142230 Pleural effusion 142230 EXAM: PORTABLE CHEST - 1 VIEW COMPARISON:  03/25/2023 FINDINGS: New moderate left pleural effusion with adjacent opacities in the left lower lung. Right lung remains clear. Heart size upper limits normal. Aortic Atherosclerosis (ICD10-170.0). No pneumothorax. Visualized bones unremarkable. IMPRESSION: New moderate left pleural effusion. Electronically Signed   By: Corlis Leak M.D.   On: 03/29/2023 10:48    Scheduled Meds:  arformoterol  15 mcg Nebulization BID   aspirin EC  81 mg Oral Daily   Chlorhexidine Gluconate Cloth  6  each Topical Daily   cyanocobalamin  1,000 mcg Oral Daily   feeding supplement (GLUCERNA SHAKE)  237 mL Oral TID BM   gabapentin  100 mg Oral QHS   insulin aspart  0-9 Units Subcutaneous TID WC   insulin glargine-yfgn  7 Units Subcutaneous QHS   multivitamin  1 tablet Oral BID   promethazine  6.25 mg Oral QHS   revefenacin  175 mcg Nebulization Daily   rosuvastatin  10 mg Oral Once per day on Mon Wed Fri Sat   sodium chloride  1 g Oral BID WC   Continuous  Infusions:  furosemide (LASIX) 200 mg in dextrose 5 % 100 mL (2 mg/mL) infusion 20 mg/hr (03/30/23 1636)   heparin 800 Units/hr (03/30/23 1600)   milrinone 0.25 mcg/kg/min (03/30/23 1635)   norepinephrine (LEVOPHED) Adult infusion       LOS: 5 days    Time spent: 35 mins    Silvano Bilis, MD [facility]   If 7PM-7AM, please contact night-coverage

## 2023-03-30 NOTE — Consult Note (Signed)
Consultation Note Date: 03/30/2023   Patient Name: Jill Hudson  DOB: 03-20-1935  MRN: [id number]  Age / Sex: 88 y.o., female  PCP: Reubin Milan, MD Referring Physician: Kathrynn Running, MD  Reason for Consultation: Establishing goals of care  HPI/Patient Profile: This 88 year old female with history of COPD, HTN, HLD, DM2, stroke, TIA, GERD, anxiety, PVD, CAD, MI, former tobacco use, CKD stage III, GI bleed, iron deficiency anemia, PAD status post PCI comes to the hospital with shortness of breath and hypoxia. Patient was found to have bilateral pulmonary edema /effusion with elevated BNP.  Patient is admitted for further evaluation.  Cardiology is consulted.  Patient will eventually need right and left heart cath pending improvement in renal functions.   Clinical Assessment and Goals of Care: Notes and labs reviewed. In to see patient. Patient is widowed from husband whom she has known since early childhood. She has 3 children, children and great- grandchildren. Her son lives in [place] and is a neurologist.    With discussion of functional status, she states she typically sits on the front porch. She moves through the house with a rolator, and becomes "winded" with going to the vehicle from the house. No home O2. [place].    We discussed her diagnosis, prognosis, GOC, EOL wishes disposition and options.  Created space and opportunity for patient  to explore thoughts and feelings regarding current medical information.   Patient and daughter have been kept updated. A detailed discussion was had today regarding advanced directives.  Concepts specific to code status, artifical feeding and hydration, IV antibiotics and rehospitalization were discussed.  The difference between an aggressive medical intervention path and a comfort care path was discussed.  Values and  goals of care important to patient and family were attempted to be elicited.  Discussed limitations of medical interventions to prolong quality of life in some situations and discussed the concept of human mortality.  She advises that she is a woman of faith and believes that when she leaves this earth, she will be in heaven with God, a new body, and friends and family who have gone on before. She states she would not want CPR, ventilator support, or dialysis. She and her daughter agree she will need to speak with patient's son prior to making formal changes to code status.   Discussed time for outcomes.  PMT will follow.       SUMMARY OF RECOMMENDATIONS    Would like DNR/DNI, no dialysis, but would like to talk to son who is a neurologist prior to making formal changes.   Prognosis:  Unable to determine      Primary Diagnoses: Present on Admission:  COPD exacerbation (HCC)  Acute on chronic diastolic CHF (congestive heart failure) (HCC)  Essential hypertension  Atrial fibrillation with RVR (HCC)  HLD (hyperlipidemia)  Type II diabetes mellitus with renal manifestations (HCC)  CAD (coronary artery disease)  Myocardial injury  Hyponatremia  Severe sepsis (  HCC)  Iron deficiency anemia  Chronic kidney disease, stage 3a (HCC)  PAD (peripheral artery disease) (HCC)  AKI (acute kidney injury) (HCC)  Chest pain  Acute respiratory failure with hypoxia (HCC)  Acute combined systolic and diastolic heart failure (HCC)   I have reviewed the medical record, interviewed the patient and family, and examined the patient. The following aspects are pertinent.  Past Medical History:  Diagnosis Date   Acute CHF (congestive heart failure) (HCC) 02/18/2020   Acute diastolic CHF (congestive heart failure) (HCC)    Acute kidney injury superimposed on CKD (HCC)    Allergies    Anxiety    Arthritis    spine and shoulder   Atherosclerosis of artery of extremity with rest pain (HCC) 10/12/2018    Cancer (HCC)    skin   CHF (congestive heart failure) (HCC)    Diabetes mellitus without complication (HCC)    Diverticulosis of large intestine with hemorrhage    GI bleeding 12/25/2019   Heart murmur    Hyperlipidemia    Hypertension    Macula lutea degeneration    Mitral and aortic valve disease    Myocardial infarction (HCC)    may have had a "light" heart attack   Non-ST elevation (NSTEMI) myocardial infarction (HCC)    Occasional tremors    PAD (peripheral artery disease) (HCC)    Rectal bleeding    Shingles    patient unaware but daughter confirms. it was a long time ago   Stroke [facility]) 01/2017   may have had a slight stroke   TIA (transient ischemic attack) 01/2017   UTI (urinary tract infection)    Vascular disease, peripheral (HCC)    Social History   Socioeconomic History   Marital status: Widowed    Spouse name: kermit   Number of children: 3   Years of education: Not on file   Highest education level: 12th grade  Occupational History   Occupation: Retired    Comment: homemaker  Tobacco Use   Smoking status: Former    Packs/day: 2.00    Years: 20.00    Additional pack years: 0.00    Total pack years: 40.00    Types: Cigarettes    Quit date: 1980    Years since quitting: 44.4   Smokeless tobacco: Never   Tobacco comments:    smoking cessation materials not required  Vaping Use   Vaping Use: Never used  Substance and Sexual Activity   Alcohol use: No    Alcohol/week: 0.0 standard drinks of alcohol   Drug use: No   Sexual activity: Not Currently  Other Topics Concern   Not on file  Social History Narrative   Husband has dementia and may wander. Requires constant supervision. Pt's daughter also lives with them. Her son [name] is a Insurance account manager.    Social Determinants of Health   Financial Resource Strain: Low Risk  (01/01/2023)   Overall Financial Resource Strain (CARDIA)    Difficulty of Paying Living Expenses: Not hard at all  Food Insecurity: No  Food Insecurity (03/25/2023)   Hunger Vital Sign    Worried About Running Out of Food in the Last Year: Never true    Ran Out of Food in the Last Year: Never true  Transportation Needs: No Transportation Needs (03/25/2023)   PRAPARE - Administrator, Civil Service (Medical): No    Lack of Transportation (Non-Medical): No  Physical Activity: Insufficiently Active (03/11/2022)   Exercise Vital Sign  Days of Exercise per Week: 3 days    Minutes of Exercise per Session: 10 min  Stress: No Stress Concern Present (03/11/2022)   [employer] of Occupational Health - Occupational Stress Questionnaire    Feeling of Stress : Not at all  Social Connections: Socially Integrated (05/12/2022)   Social Connection and Isolation Panel [NHANES]    Frequency of Communication with Friends and Family: More than three times a week    Frequency of Social Gatherings with Friends and Family: More than three times a week    Attends Religious Services: More than 4 times per year    Active Member of [employer] or Organizations: Yes    Attends Engineer, structural: More than 4 times per year    Marital Status: Married   Family History  Problem Relation Age of Onset   Dementia Mother    Diabetes Father    Scheduled Meds:  arformoterol  15 mcg Nebulization BID   aspirin EC  81 mg Oral Daily   Chlorhexidine Gluconate Cloth  6 each Topical Daily   cyanocobalamin  1,000 mcg Oral Daily   feeding supplement (GLUCERNA SHAKE)  237 mL Oral TID BM   gabapentin  100 mg Oral QHS   insulin aspart  0-9 Units Subcutaneous TID WC   insulin glargine-yfgn  7 Units Subcutaneous QHS   multivitamin  1 tablet Oral BID   promethazine  6.25 mg Oral QHS   revefenacin  175 mcg Nebulization Daily   rosuvastatin  10 mg Oral Once per day on Mon Wed Fri Sat   sodium chloride  1 g Oral BID WC   Continuous Infusions:  furosemide (LASIX) 200 mg in dextrose 5 % 100 mL (2 mg/mL) infusion 20 mg/hr (03/30/23 1000)    heparin 800 Units/hr (03/30/23 1447)   milrinone 0.25 mcg/kg/min (03/30/23 1000)   norepinephrine (LEVOPHED) Adult infusion     PRN Meds:.acetaminophen, alum & mag hydroxide-simeth, dextromethorphan-guaiFENesin, hydrALAZINE, levalbuterol, melatonin, ondansetron (ZOFRAN) IV, mouth rinse, oxyCODONE, senna-docusate, traZODone Medications Prior to Admission:  Prior to Admission medications   Medication Sig Start Date End Date Taking? Authorizing Provider  acetaminophen (TYLENOL) 650 MG CR tablet Take 1,300 mg by mouth every 8 (eight) hours as needed for pain.   Yes [provider]  albuterol (VENTOLIN HFA) 108 (90 Base) MCG/ACT inhaler Inhale 2 puffs into the lungs every 6 (six) hours as needed for wheezing or shortness of breath. 01/01/23  Yes Reubin Milan, MD  amLODipine (NORVASC) 5 MG tablet Take 5 mg by mouth in the morning and at bedtime. 09/03/20  Yes [provider]  apixaban (ELIQUIS) 2.5 MG TABS tablet TAKE 1 TABLET(2.5 MG) BY MOUTH TWICE DAILY 03/24/22  Yes Reubin Milan, MD  cloNIDine (CATAPRES) 0.1 MG tablet Take 0.1 mg by mouth 2 (two) times daily as needed. 08/19/21  Yes [provider]  Cyanocobalamin 1000 MCG TBCR Take 1,000 mcg by mouth daily.   Yes [provider]  ferrous sulfate 325 (65 FE) MG tablet Take 325 mg by mouth daily with breakfast.   Yes [provider]  Fluticasone-Umeclidin-Vilant (TRELEGY ELLIPTA) 100-62.5-25 MCG/ACT AEPB Inhale 1 puff into the lungs daily. 02/19/23  Yes Reubin Milan, MD  gabapentin (NEURONTIN) 100 MG capsule TAKE 1 CAPSULE(100 MG) BY MOUTH AT BEDTIME 09/24/22  Yes Reubin Milan, MD  insulin glargine (LANTUS) 100 UNIT/ML injection Inject 0.05 mLs (5 Units total) into the skin daily. 03/24/22  Yes Reubin Milan, MD  Insulin  Lispro w/ Trans Port 100 UNIT/ML SOPN 3 Units daily. ( Humalog ) 04/08/21  Yes [provider]  ipratropium-albuterol (DUONEB) 0.5-2.5 (3) MG/3ML SOLN Take 3 mLs  by nebulization every 6 (six) hours as needed. 02/19/23  Yes Reubin Milan, MD  losartan (COZAAR) 25 MG tablet Take 25 mg by mouth 2 (two) times daily. Unsure of dosage   Yes [provider]  melatonin 5 MG TABS Take 0.5 tablets (2.5 mg total) by mouth at bedtime as needed (sleep). 02/26/20  Yes Wieting, Richard, MD  Multiple Vitamins-Minerals (PRESERVISION AREDS PO) Take 1 capsule by mouth 2 (two) times daily.    Yes [provider]  promethazine-dextromethorphan (PROMETHAZINE-DM) 6.25-15 MG/5ML syrup TAKE 5 ML BY MOUTH EVERY NIGHT AT BEDTIME 03/19/23  Yes Reubin Milan, MD  rosuvastatin (CRESTOR) 10 MG tablet Take 1 tablet (10 mg total) by mouth 4 (four) times a week. Monday, Wed, Friday and Saturday 01/02/23  Yes Reubin Milan, MD  torsemide (DEMADEX) 20 MG tablet Take 2 tablets (40 mg total) by mouth daily. Patient taking differently: Take 40 mg by mouth 2 (two) times daily. 1/2 tablet morning and 1/2 tablet at night. 02/27/20  Yes Wieting, Richard, MD  feeding supplement, GLUCERNA SHAKE, (GLUCERNA SHAKE) LIQD Take 237 mLs by mouth 3 (three) times daily between meals. 12/28/19   Alford Highland, MD  Insulin Pen Needle (B-D ULTRAFINE III SHORT PEN) 31G X 8 MM MISC USE AS DIRECTED 03/24/22   Reubin Milan, MD   Allergies  Allergen Reactions   Cefdinir Diarrhea   Saxagliptin Diarrhea   Epinephrine Other (See Comments)    Patient does not remember what happens when she uses this   Evolocumab     Pain with injection   Atorvastatin Other (See Comments)    Muscle aches   Codeine Other (See Comments)    Upset stomach   Ezetimibe Other (See Comments)    Myalgias(ZETIA)   Limonene Rash    Patient does not recall this reaction   Nitrofurantoin Rash and Other (See Comments)    Pruitus   Sulfa Antibiotics Rash and Other (See Comments)    Sore mouth    Review of Systems  All other systems reviewed and are negative.   Physical Exam Pulmonary:     Effort: Pulmonary  effort is normal.     Comments: Congested cough with laughing.  Neurological:     Mental Status: She is alert.     Vital Signs: BP (!) 99/56   Pulse 65   Temp 98 F (36.7 C) (Oral)   Resp 13   Ht 5' (1.524 m)   Wt 61.1 kg   SpO2 96%   BMI 26.31 kg/m  Pain Scale: 0-10   Pain Score: 0-No pain   SpO2: SpO2: 96 % O2 Device:SpO2: 96 % O2 Flow Rate: .O2 Flow Rate (L/min): 3 L/min  IO: Intake/output summary:  Intake/Output Summary (Last 24 hours) at 03/30/2023 1528 Last data filed at 03/30/2023 1000 Gross per 24 hour  Intake 586.87 ml  Output 1020 ml  Net -433.13 ml    LBM:   Baseline Weight: Weight: 56.2 kg Most recent weight: Weight: 61.1 kg       Signed by: Morton Stall, NP   Please contact Palliative Medicine Team phone at [phone] for questions and concerns.  For individual provider: See Loretha Stapler

## 2023-03-30 NOTE — Progress Notes (Signed)
ANTICOAGULATION CONSULT NOTE  Pharmacy Consult for heparin infusion Indication: ACS/STEMI/ apixaban for Afib PTA  Allergies  Allergen Reactions   Cefdinir Diarrhea   Saxagliptin Diarrhea   Epinephrine Other (See Comments)    Patient does not remember what happens when she uses this   Evolocumab     Pain with injection   Atorvastatin Other (See Comments)    Muscle aches   Codeine Other (See Comments)    Upset stomach   Ezetimibe Other (See Comments)    Myalgias(ZETIA)   Limonene Rash    Patient does not recall this reaction   Nitrofurantoin Rash and Other (See Comments)    Pruitus   Sulfa Antibiotics Rash and Other (See Comments)    Sore mouth     Patient Measurements: Height: 5' (152.4 cm) Weight: 61.3 kg (135 lb 2.3 oz) IBW/kg (Calculated) : 45.5 Heparin Dosing Weight: 56.2 kg  Vital Signs: Temp: 98.4 F (36.9 C) (06/04 0000) Temp Source: Oral (06/04 0000) BP: 121/55 (06/04 0200) Pulse Rate: 69 (06/04 0200)  Labs: Recent Labs    03/28/23 0531 03/29/23 0412 03/29/23 1612 03/29/23 2315 03/29/23 2317  HGB 9.4* 9.7*  --  9.3*  --   HCT 28.8* 29.4*  --  27.8*  --   PLT 225 242  --  228  --   APTT 82* 58* 40* 176*  --   HEPARINUNFRC >1.10* 0.68  --   --  0.94*  CREATININE 2.84* 2.89*  --  2.79*  --      Estimated Creatinine Clearance: 11.8 mL/min (A) (by C-G formula based on SCr of 2.79 mg/dL (H)).   Medical History: Past Medical History:  Diagnosis Date   Acute CHF (congestive heart failure) (HCC) 02/18/2020   Acute diastolic CHF (congestive heart failure) (HCC)    Acute kidney injury superimposed on CKD (HCC)    Allergies    Anxiety    Arthritis    spine and shoulder   Atherosclerosis of artery of extremity with rest pain (HCC) 10/12/2018   Cancer (HCC)    skin   CHF (congestive heart failure) (HCC)    Diabetes mellitus without complication (HCC)    Diverticulosis of large intestine with hemorrhage    GI bleeding 12/25/2019   Heart murmur     Hyperlipidemia    Hypertension    Macula lutea degeneration    Mitral and aortic valve disease    Myocardial infarction (HCC)    may have had a "light" heart attack   Non-ST elevation (NSTEMI) myocardial infarction (HCC)    Occasional tremors    PAD (peripheral artery disease) (HCC)    Rectal bleeding    Shingles    patient unaware but daughter confirms. it was a long time ago   Stroke [facility]) 01/2017   may have had a slight stroke   TIA (transient ischemic attack) 01/2017   UTI (urinary tract infection)    Vascular disease, peripheral (HCC)     Medications:  PTA Meds: Apixaban 2.5 mg BID, last dose 5/30 @ 1856  Assessment: Pt is a 88 yo female initially presenting to ED w/ COPD exacerbation, now c/o increase WOB , found with increasing Troponin I levels.  5/31 1428 aPTT=95 Therapeutic x1 5/31 2228 aPTT=120, supratherapeutic 6/1   0802 aPTT=68/HL>1.10   aPTT therapeuticx1 6/1   1358 aPTT= 89  Therapeuticx2 6/2   0531 aPTT = 82, therapeutic x 3 / HL > 1.1, not correlating 6/3   0412 aPTT = 58, subtherapeutic /  HL 0.68, not correlating 6/3   1612 aPTT = 40, subtherapeutic 6/3   2315 aPTT = 176,  HL = 0.94 - possible sampling error   Goal of Therapy:  Heparin level 0.3-0.7 units/ml aPTT 66-102 seconds Monitor platelets by anticoagulation protocol: Yes   Plan:  6/3 @ 2315:  aPTT = 176,  HL = 0.94 - HL elevated from Eliquis PTA,   aPTT greatly elevated from previous level (4 X previous level) so suspect sampling error since this was drawn from central line by RN - Will order STAT repeat of previous levels   Will follow aPTT until correlation w/ HL confirmed.  HL & CBC daily while on heparin  Corra Kaine D, PharmD Clinical Pharmacist 03/30/2023 3:40 AM

## 2023-03-30 NOTE — Progress Notes (Signed)
Advanced Heart Failure Team Progress Note   Primary Physician: Reubin Milan, MD PCP-Cardiologist:  None  Reason for Consultation: Acute systolic HF  Interval history   Anuric most of the day yesterday despite increasing doses of lasix.  Moved to ICU.   Central line placed. CVP 21 Co-ox 44%  Started on milrinone 0.25 and lasix gtt at 20  Urine output has improved. Breathing better but she is tired. Scr slightly improved. CP has resolved  Remains on heparin - no obvious bleeding  CVP 21 -> 12  co-ox 65%  Objective:    Vital Signs:   Temp:  [97.6 F (36.4 C)-98.4 F (36.9 C)] 98 F (36.7 C) (06/04 0800) Pulse Rate:  [63-105] 71 (06/04 0900) Resp:  [11-23] 18 (06/04 0900) BP: (104-134)/(10-83) 122/48 (06/04 0900) SpO2:  [94 %-100 %] 96 % (06/04 0900) Weight:  [61.1 kg-61.3 kg] 61.1 kg (06/04 0427)    Weight change: Filed Weights   03/29/23 0600 03/29/23 1458 03/30/23 0427  Weight: 59.6 kg 61.3 kg 61.1 kg    Intake/Output:   Intake/Output Summary (Last 24 hours) at 03/30/2023 0958 Last data filed at 03/30/2023 0800 Gross per 24 hour  Intake 743.2 ml  Output 1180 ml  Net -436.8 ml       Physical Exam    General:  Elderly woman lying flat in bed. No resp difficulty HEENT: normal Neck: supple. JVP 12. Carotids 2+ bilat; no bruits. No lymphadenopathy or thryomegaly appreciated. Cor: PMI nondisplaced. Regular rate & rhythm. 2/6 TR Lungs: crackles on left Abdomen: soft, nontender, nondistended. No hepatosplenomegaly. No bruits or masses. Good bowel sounds. Extremities: no cyanosis, clubbing, rash, 1+ edema Neuro: alert & orientedx3, cranial nerves grossly intact. moves all 4 extremities w/o difficulty. Affect pleasant   Telemetry   Sinus 70s  Personally reviewed  Labs   Basic Metabolic Panel: Recent Labs  Lab 03/27/23 0437 03/28/23 0531 03/29/23 0412 03/29/23 2315 03/30/23 0345  NA 126* 125* 127* 127* 127*  K 4.4 3.9 3.7 4.0 3.6  CL 97*  94* 95* 93* 96*  CO2 15* 18* 21* 19* 21*  GLUCOSE 151* 218* 118* 241* 224*  BUN 60* 76* 87* 92* 92*  CREATININE 2.59* 2.84* 2.89* 2.79* 2.69*  CALCIUM 8.4* 7.9* 7.7* 7.8* 7.5*  MG 2.1 2.1  --   --  2.2  PHOS  --  5.9*  --   --   --      Liver Function Tests: Recent Labs  Lab 03/25/23 1001 03/29/23 1612  AST 22  --   ALT 16  --   ALKPHOS 73  --   BILITOT 0.6  --   PROT 6.2* 5.3*  ALBUMIN 3.5  --     No results for input(s): "LIPASE", "AMYLASE" in the last 168 hours. No results for input(s): "AMMONIA" in the last 168 hours.  CBC: Recent Labs  Lab 03/26/23 0444 03/27/23 0437 03/28/23 0531 03/29/23 0412 03/29/23 2315  WBC 20.7* 21.9* 15.2* 15.6* 11.9*  HGB 10.3* 9.5* 9.4* 9.7* 9.3*  HCT 31.7* 28.8* 28.8* 29.4* 27.8*  MCV 87.6 88.3 87.0 86.2 86.6  PLT 264 235 225 242 228     Cardiac Enzymes: No results for input(s): "CKTOTAL", "CKMB", "CKMBINDEX", "TROPONINI" in the last 168 hours.  BNP: BNP (last 3 results) Recent Labs    03/25/23 1001  BNP 971.3*     ProBNP (last 3 results) No results for input(s): "PROBNP" in the last 8760 hours.   CBG: Recent Labs  Lab 03/29/23 0733 03/29/23 1214 03/29/23 1647 03/29/23 2339 03/30/23 0727  GLUCAP 85 138* 145* 226* 180*     Coagulation Studies: No results for input(s): "LABPROT", "INR" in the last 72 hours.   Imaging   DG Chest Port 1 View  Result Date: 03/29/2023 CLINICAL DATA:  Line placement EXAM: PORTABLE CHEST 1 VIEW COMPARISON:  X-ray 03/29/2023 FINDINGS: Calcified aorta. Stable cardiopericardial silhouette. New left IJ catheter with tip at the SVC right atrial junction region. Small pleural effusions are seen, left-greater-than-right with the adjacent opacities. No pneumothorax or edema. Overlapping cardiac leads. Osteopenia. IMPRESSION: Left IJ catheter in place.  No pneumothorax. Electronically Signed   By: Karen Kays M.D.   On: 03/29/2023 16:19   US THORACENTESIS ASP PLEURAL SPACE W/IMG  GUIDE  Result Date: 03/29/2023 INDICATION: Bilateral pleural effusions. Request received for diagnostic and therapeutic thoracentesis EXAM: ULTRASOUND GUIDED RIGHT THORACENTESIS MEDICATIONS: 4 cc 1% lidocaine COMPLICATIONS: None immediate. PROCEDURE: An ultrasound guided thoracentesis was thoroughly discussed with the patient and questions answered. The benefits, risks, alternatives and complications were also discussed. The patient understands and wishes to proceed with the procedure. Written consent was obtained. Ultrasound was performed to localize and mark an adequate pocket of fluid in the right chest. The area was then prepped and draped in the normal sterile fashion. 1% Lidocaine was used for local anesthesia. Under ultrasound guidance a 6 Fr Safe-T-Centesis catheter was introduced. Thoracentesis was performed. The catheter was removed and a dressing applied. FINDINGS: A total of approximately 700 mL of pale yellow fluid was removed. Samples were sent to the laboratory as requested by the clinical team. IMPRESSION: Successful ultrasound guided right thoracentesis yielding 700 mL of pleural fluid. Follow-up chest x-ray revealed no evidence of pneumothorax. Procedure performed by Mina Marble, PA-C Electronically Signed   By: Malachy Moan M.D.   On: 03/29/2023 13:25   DG Chest Port 1 View  Result Date: 03/29/2023 CLINICAL DATA:  142230 Pleural effusion 142230 EXAM: PORTABLE CHEST - 1 VIEW COMPARISON:  03/25/2023 FINDINGS: New moderate left pleural effusion with adjacent opacities in the left lower lung. Right lung remains clear. Heart size upper limits normal. Aortic Atherosclerosis (ICD10-170.0). No pneumothorax. Visualized bones unremarkable. IMPRESSION: New moderate left pleural effusion. Electronically Signed   By: Corlis Leak M.D.   On: 03/29/2023 10:48     Medications:     Current Medications:  arformoterol  15 mcg Nebulization BID   Chlorhexidine Gluconate Cloth  6 each Topical Daily    cyanocobalamin  1,000 mcg Oral Daily   feeding supplement (GLUCERNA SHAKE)  237 mL Oral TID BM   gabapentin  100 mg Oral QHS   insulin aspart  0-9 Units Subcutaneous TID WC   insulin glargine-yfgn  7 Units Subcutaneous QHS   multivitamin  1 tablet Oral BID   promethazine  6.25 mg Oral QHS   revefenacin  175 mcg Nebulization Daily   rosuvastatin  10 mg Oral Once per day on Mon Wed Fri Sat   sodium chloride  1 g Oral BID WC    Infusions:  furosemide (LASIX) 200 mg in dextrose 5 % 100 mL (2 mg/mL) infusion 20 mg/hr (03/30/23 0822)   heparin 800 Units/hr (03/30/23 0614)   milrinone 0.25 mcg/kg/min (03/30/23 0600)   norepinephrine (LEVOPHED) Adult infusion        Assessment/Plan   1. Acute systolic HF - Echo 2023 EF 55-60% - Echo this admit read as EF 30-35%. RV ok  -> I reviewed. EF 35-40% there  is severe HK of basaliar to mid lateral wall and inferolateral wall - High-sensitivity troponin 225 -> 458 - >1186 -> 1165.  BNP 971  - Suspect this is an ischemic CM  - Developed low output HF and elevated volume status. Initial co-ox 44% CVP 21 (03/29/23) - Now improving with milrinone and IV lasix - CVP 12 co-ox 65% - Would continue IV lasix and milrinone at current dose today - Eventually will need coronary if renal function improves  2. NSTEMI - echo as above with RWMA - no longer having CP today - continue ASA/statin/heparin for now - with rub on exam consider colchicine  3. AKI on CKD 3a - likely ATN/cardiorenal  - Scr 1.5 -> 2.8 -> 2.7 - Plan as above -> should improve with inotropic support for acute HF - renal u/s 3/21 with medico-renal disease. May be worth repeating - renal following  4. Acute hypoxic respiratory failure - likely due primarily to HF and pleural effusions on top of underlying COPD - s/p R thora on 6/3 with 700cc out  - Continue diuresis  5. PAF  - in NSR. Continue heparin   6. Hypervolemic hyponatremia - restrict FW, treat HF  CRITICAL  CARE Performed by: Arvilla Meres  Total critical care time: 40 minutes  Critical care time was exclusive of separately billable procedures and treating other patients.  Critical care was necessary to treat or prevent imminent or life-threatening deterioration.  Critical care was time spent personally by me (independent of midlevel providers or residents) on the following activities: development of treatment plan with patient and/or surrogate as well as nursing, discussions with consultants, evaluation of patient's response to treatment, examination of patient, obtaining history from patient or surrogate, ordering and performing treatments and interventions, ordering and review of laboratory studies, ordering and review of radiographic studies, pulse oximetry and re-evaluation of patient's condition.   Length of Stay: 5  Arvilla Meres, MD  03/30/2023, 9:58 AM  Advanced Heart Failure Team Pager [phone] (M-F; 7a - 5p)  Please contact [facility] for night-coverage after hours (4p -7a ) and weekends on [email or web address]

## 2023-03-30 NOTE — Progress Notes (Signed)
ANTICOAGULATION CONSULT NOTE  Pharmacy Consult for heparin infusion Indication: ACS/STEMI/ apixaban for Afib PTA  Allergies  Allergen Reactions   Cefdinir Diarrhea   Saxagliptin Diarrhea   Epinephrine Other (See Comments)    Patient does not remember what happens when she uses this   Evolocumab     Pain with injection   Atorvastatin Other (See Comments)    Muscle aches   Codeine Other (See Comments)    Upset stomach   Ezetimibe Other (See Comments)    Myalgias(ZETIA)   Limonene Rash    Patient does not recall this reaction   Nitrofurantoin Rash and Other (See Comments)    Pruitus   Sulfa Antibiotics Rash and Other (See Comments)    Sore mouth     Patient Measurements: Height: 5' (152.4 cm) Weight: 61.1 kg (134 lb 11.2 oz) IBW/kg (Calculated) : 45.5 Heparin Dosing Weight: 56.2 kg  Vital Signs: Temp: 98 F (36.7 C) (06/04 0800) Temp Source: Oral (06/04 0800) BP: 99/56 (06/04 1300) Pulse Rate: 65 (06/04 1300)  Labs: Recent Labs    03/28/23 0531 03/29/23 0412 03/29/23 1612 03/29/23 2315 03/29/23 2317 03/30/23 0345 03/30/23 1423  HGB 9.4* 9.7*  --  9.3*  --   --   --   HCT 28.8* 29.4*  --  27.8*  --   --   --   PLT 225 242  --  228  --   --   --   APTT 82* 58*   < > 176*  --  197* 94*  HEPARINUNFRC >1.10* 0.68  --   --  0.94* 0.98*  --   CREATININE 2.84* 2.89*  --  2.79*  --  2.69* 2.75*   < > = values in this interval not displayed.     Estimated Creatinine Clearance: 12 mL/min (A) (by C-G formula based on SCr of 2.75 mg/dL (H)).   Medical History: Past Medical History:  Diagnosis Date   Acute CHF (congestive heart failure) (HCC) 02/18/2020   Acute diastolic CHF (congestive heart failure) (HCC)    Acute kidney injury superimposed on CKD (HCC)    Allergies    Anxiety    Arthritis    spine and shoulder   Atherosclerosis of artery of extremity with rest pain (HCC) 10/12/2018   Cancer (HCC)    skin   CHF (congestive heart failure) (HCC)    Diabetes  mellitus without complication (HCC)    Diverticulosis of large intestine with hemorrhage    GI bleeding 12/25/2019   Heart murmur    Hyperlipidemia    Hypertension    Macula lutea degeneration    Mitral and aortic valve disease    Myocardial infarction (HCC)    may have had a "light" heart attack   Non-ST elevation (NSTEMI) myocardial infarction (HCC)    Occasional tremors    PAD (peripheral artery disease) (HCC)    Rectal bleeding    Shingles    patient unaware but daughter confirms. it was a long time ago   Stroke [facility]) 01/2017   may have had a slight stroke   TIA (transient ischemic attack) 01/2017   UTI (urinary tract infection)    Vascular disease, peripheral (HCC)     Medications:  PTA Meds: Apixaban 2.5 mg BID, last dose 5/30 @ 1856  Assessment: Pt is a 88 yo female initially presenting to ED w/ COPD exacerbation, now c/o increase WOB , found with increasing Troponin I levels.  5/31 1428 aPTT=95 Therapeutic x1 5/31 2228  aPTT=120, supratherapeutic 6/1   0802 aPTT=68/HL>1.10   aPTT therapeuticx1 6/1   1358 aPTT= 89  Therapeuticx2 6/2   0531 aPTT = 82, therapeutic x 3 / HL > 1.1, not correlating 6/3   0412 aPTT = 58, subtherapeutic / HL 0.68, not correlating 6/3   1612 aPTT = 40, subtherapeutic 6/3   2315 aPTT = 176,  HL = 0.94 - possible sampling error 6/4   0345 aPTT = 197,  HL = 0.98 - elevated  6/4   1423 aPTT = 94   Goal of Therapy:  Heparin level 0.3-0.7 units/ml aPTT 66-102 seconds Monitor platelets by anticoagulation protocol: Yes   Plan: aPTT therapeutic x 1 Continue heparin 800 units/hr.  Recheck aPTT in 8 hours Will follow aPTT until correlation w/ HL confirmed.  HL & CBC daily while on heparin   Elliot Gurney, PharmD, BCPS Clinical Pharmacist  03/30/2023 3:01 PM

## 2023-03-31 DIAGNOSIS — I5021 Acute systolic (congestive) heart failure: Secondary | ICD-10-CM | POA: Diagnosis not present

## 2023-03-31 DIAGNOSIS — N1832 Chronic kidney disease, stage 3b: Secondary | ICD-10-CM | POA: Diagnosis not present

## 2023-03-31 DIAGNOSIS — N179 Acute kidney failure, unspecified: Secondary | ICD-10-CM | POA: Diagnosis not present

## 2023-03-31 DIAGNOSIS — I509 Heart failure, unspecified: Secondary | ICD-10-CM | POA: Diagnosis not present

## 2023-03-31 DIAGNOSIS — I5023 Acute on chronic systolic (congestive) heart failure: Secondary | ICD-10-CM

## 2023-03-31 DIAGNOSIS — I480 Paroxysmal atrial fibrillation: Secondary | ICD-10-CM

## 2023-03-31 DIAGNOSIS — E871 Hypo-osmolality and hyponatremia: Secondary | ICD-10-CM | POA: Diagnosis not present

## 2023-03-31 DIAGNOSIS — J441 Chronic obstructive pulmonary disease with (acute) exacerbation: Secondary | ICD-10-CM | POA: Diagnosis not present

## 2023-03-31 DIAGNOSIS — E876 Hypokalemia: Secondary | ICD-10-CM | POA: Insufficient documentation

## 2023-03-31 DIAGNOSIS — I129 Hypertensive chronic kidney disease with stage 1 through stage 4 chronic kidney disease, or unspecified chronic kidney disease: Secondary | ICD-10-CM | POA: Diagnosis not present

## 2023-03-31 DIAGNOSIS — I214 Non-ST elevation (NSTEMI) myocardial infarction: Secondary | ICD-10-CM | POA: Diagnosis not present

## 2023-03-31 DIAGNOSIS — Z7189 Other specified counseling: Secondary | ICD-10-CM | POA: Diagnosis not present

## 2023-03-31 LAB — CBC
HCT: 25.3 % — ABNORMAL LOW (ref 36.0–46.0)
Hemoglobin: 8.5 g/dL — ABNORMAL LOW (ref 12.0–15.0)
MCH: 29.2 pg (ref 26.0–34.0)
MCHC: 33.6 g/dL (ref 30.0–36.0)
MCV: 86.9 fL (ref 80.0–100.0)
Platelets: 196 10*3/uL (ref 150–400)
RBC: 2.91 MIL/uL — ABNORMAL LOW (ref 3.87–5.11)
RDW: 12.8 % (ref 11.5–15.5)
WBC: 10.2 10*3/uL (ref 4.0–10.5)
nRBC: 0.4 % — ABNORMAL HIGH (ref 0.0–0.2)

## 2023-03-31 LAB — BASIC METABOLIC PANEL
Anion gap: 10 (ref 5–15)
Anion gap: 11 (ref 5–15)
BUN: 79 mg/dL — ABNORMAL HIGH (ref 8–23)
BUN: 84 mg/dL — ABNORMAL HIGH (ref 8–23)
CO2: 22 mmol/L (ref 22–32)
CO2: 23 mmol/L (ref 22–32)
Calcium: 7.9 mg/dL — ABNORMAL LOW (ref 8.9–10.3)
Calcium: 8.1 mg/dL — ABNORMAL LOW (ref 8.9–10.3)
Chloride: 98 mmol/L (ref 98–111)
Chloride: 97 mmol/L — ABNORMAL LOW (ref 98–111)
Creatinine, Ser: 2.42 mg/dL — ABNORMAL HIGH (ref 0.44–1.00)
Creatinine, Ser: 2.22 mg/dL — ABNORMAL HIGH (ref 0.44–1.00)
GFR, Estimated: 19 mL/min — ABNORMAL LOW (ref >60)
GFR, Estimated: 21 mL/min — ABNORMAL LOW (ref >60)
Glucose, Bld: 125 mg/dL — ABNORMAL HIGH (ref 70–99)
Glucose, Bld: 286 mg/dL — ABNORMAL HIGH (ref 70–99)
Potassium: 3 mmol/L — ABNORMAL LOW (ref 3.5–5.1)
Potassium: 3.8 mmol/L (ref 3.5–5.1)
Sodium: 130 mmol/L — ABNORMAL LOW (ref 135–145)
Sodium: 131 mmol/L — ABNORMAL LOW (ref 135–145)

## 2023-03-31 LAB — HEPARIN LEVEL (UNFRACTIONATED): Heparin Unfractionated: 0.53 IU/mL (ref 0.30–0.70)

## 2023-03-31 LAB — COOXEMETRY PANEL
Carboxyhemoglobin: 1.8 % — ABNORMAL HIGH (ref 0.5–1.5)
Methemoglobin: <0.7 % (ref 0.0–1.5)
O2 Saturation: 77 %
Total hemoglobin: 9 g/dL — ABNORMAL LOW (ref 12.0–16.0)
Total oxygen content: 75.6 %

## 2023-03-31 LAB — GLUCOSE, CAPILLARY
Glucose-Capillary: 99 mg/dL (ref 70–99)
Glucose-Capillary: 155 mg/dL — ABNORMAL HIGH (ref 70–99)
Glucose-Capillary: 392 mg/dL — ABNORMAL HIGH (ref 70–99)
Glucose-Capillary: 169 mg/dL — ABNORMAL HIGH (ref 70–99)

## 2023-03-31 LAB — APTT
aPTT: 111 seconds — ABNORMAL HIGH (ref 24–36)
aPTT: 80 seconds — ABNORMAL HIGH (ref 24–36)
aPTT: 72 seconds — ABNORMAL HIGH (ref 24–36)

## 2023-03-31 LAB — MAGNESIUM: Magnesium: 2.2 mg/dL (ref 1.7–2.4)

## 2023-03-31 MED ORDER — AMIODARONE LOAD VIA INFUSION
150.0000 mg | Freq: Once | INTRAVENOUS | Status: AC
  Administered 2023-03-31: 150 mg via INTRAVENOUS
  Filled 2023-03-31: qty 83.34

## 2023-03-31 MED ORDER — POTASSIUM CHLORIDE CRYS ER 20 MEQ PO TBCR
40.0000 meq | EXTENDED_RELEASE_TABLET | Freq: Once | ORAL | Status: AC
  Administered 2023-03-31: 40 meq via ORAL
  Filled 2023-03-31: qty 2

## 2023-03-31 MED ORDER — AMIODARONE HCL IN DEXTROSE 360-4.14 MG/200ML-% IV SOLN
30.0000 mg/h | INTRAVENOUS | Status: DC
  Administered 2023-03-31 – 2023-04-02 (×4): 30 mg/h via INTRAVENOUS
  Filled 2023-03-31 (×3): qty 200

## 2023-03-31 MED ORDER — AMIODARONE IV BOLUS ONLY 150 MG/100ML
INTRAVENOUS | Status: AC
  Filled 2023-03-31: qty 100

## 2023-03-31 MED ORDER — POTASSIUM CHLORIDE CRYS ER 20 MEQ PO TBCR
10.0000 meq | EXTENDED_RELEASE_TABLET | Freq: Two times a day (BID) | ORAL | Status: DC
  Administered 2023-03-31 – 2023-04-02 (×5): 10 meq via ORAL
  Filled 2023-03-31 (×5): qty 1

## 2023-03-31 MED ORDER — TORSEMIDE 20 MG PO TABS
40.0000 mg | ORAL_TABLET | Freq: Two times a day (BID) | ORAL | Status: DC
  Administered 2023-04-01 (×2): 40 mg via ORAL
  Filled 2023-03-31 (×2): qty 2

## 2023-03-31 MED ORDER — TORSEMIDE 20 MG PO TABS: 40.0000 mg | ORAL_TABLET | Freq: Two times a day (BID) | ORAL | Status: DC

## 2023-03-31 MED ORDER — AMIODARONE HCL IN DEXTROSE 360-4.14 MG/200ML-% IV SOLN
60.0000 mg/h | INTRAVENOUS | Status: AC
  Administered 2023-03-31 (×2): 60 mg/h via INTRAVENOUS
  Filled 2023-03-31 (×2): qty 200

## 2023-03-31 NOTE — Assessment & Plan Note (Addendum)
AKI on CKD stage IIIa.  Creatinine now 3.15.  Continue to monitor.  Nephrology will continue to talk with patient and family about next steps.

## 2023-03-31 NOTE — Progress Notes (Signed)
Physical Therapy Treatment Patient Details Name: Jill Hudson MRN: [id number] DOB: 1935/04/25 Today's Date: 03/31/2023   History of Present Illness presented to ER secondary to progressive SOB; admitted for management of A/C CHF exacerbation, AECOPD.  s/p thoracentesis (6/3) with removal of fluid; pending cardiac cath and TEE    PT Comments    Patient received in bed, she is finishing breakfast, agrees to PT session. Patient with improved ability to get from supine to sit, no physical assistance needed. She stands with min guard and ambulated 5 feet with Min A for safety and management of lines. She is sob after getting to recliner with O2 reading in high 70s. Returned to 100% quickly once seated. She will continue to benefit from skilled PT to improve strengthening and endurance.         Recommendations for follow up therapy are one component of a multi-disciplinary discharge planning process, led by the attending physician.  Recommendations may be updated based on patient status, additional functional criteria and insurance authorization.  Follow Up Recommendations  Can patient physically be transported by private vehicle: No    Assistance Recommended at Discharge Frequent or constant Supervision/Assistance  Patient can return home with the following A lot of help with walking and/or transfers;A little help with bathing/dressing/bathroom;Assist for transportation   Equipment Recommendations  Rolling walker (2 wheels)    Recommendations for Other Services       Precautions / Restrictions Precautions Precautions: Fall Restrictions Weight Bearing Restrictions: No     Mobility  Bed Mobility Overal bed mobility: Modified Independent Bed Mobility: Supine to Sit     Supine to sit: Modified independent (Device/Increase time)     General bed mobility comments: improved ability this session to perform bed mobility, no physical assist needed.    Transfers Overall transfer  level: Needs assistance Equipment used: Rolling walker (2 wheels) Transfers: Sit to/from Stand Sit to Stand: Min guard                Ambulation/Gait Ambulation/Gait assistance: Editor, commissioning (Feet): 5 Feet Assistive device: Rolling walker (2 wheels) Gait Pattern/deviations: Step-through pattern, Decreased step length - right, Decreased step length - left, Decreased stride length Gait velocity: decreased     General Gait Details: small shuffle steps   Stairs             Wheelchair Mobility    Modified Rankin (Stroke Patients Only)       Balance Overall balance assessment: Needs assistance Sitting-balance support: Feet unsupported Sitting balance-Leahy Scale: Good Sitting balance - Comments: able to sit unsupported   Standing balance support: Bilateral upper extremity supported, During functional activity, Reliant on assistive device for balance Standing balance-Leahy Scale: Fair Standing balance comment: RW needed with external min A for balance and safety                            Cognition Arousal/Alertness: Awake/alert Behavior During Therapy: WFL for tasks assessed/performed Overall Cognitive Status: Within Functional Limits for tasks assessed                                          Exercises Other Exercises Other Exercises: Seated B LE LAQ, marching x 10 reps    General Comments        Pertinent Vitals/Pain Pain Assessment Pain Assessment: No/denies pain  Home Living                          Prior Function            PT Goals (current goals can now be found in the care plan section) Acute Rehab PT Goals Patient Stated Goal: to get stronger and go home PT Goal Formulation: With patient Time For Goal Achievement: 04/12/23 Potential to Achieve Goals: Good Progress towards PT goals: Progressing toward goals    Frequency    Min 3X/week      PT Plan Current plan remains  appropriate    Co-evaluation              AM-PAC PT "6 Clicks" Mobility   Outcome Measure  Help needed turning from your back to your side while in a flat bed without using bedrails?: A Little Help needed moving from lying on your back to sitting on the side of a flat bed without using bedrails?: A Little Help needed moving to and from a bed to a chair (including a wheelchair)?: A Little Help needed standing up from a chair using your arms (e.g., wheelchair or bedside chair)?: A Little Help needed to walk in hospital room?: A Lot Help needed climbing 3-5 steps with a railing? : Total 6 Click Score: 15    End of Session Equipment Utilized During Treatment: Oxygen Activity Tolerance: Patient limited by fatigue;Other (comment) (SOB) Patient left: in chair;with call bell/phone within reach;Other (comment) (OT present in room) Nurse Communication: Mobility status PT Visit Diagnosis: Muscle weakness (generalized) (M62.81);Difficulty in walking, not elsewhere classified (R26.2)     Time: 1610-9604 PT Time Calculation (min) (ACUTE ONLY): 19 min  Charges:  $Therapeutic Activity: 8-22 mins                     Tollie Canada, PT, GCS 03/31/23,11:15 AM

## 2023-03-31 NOTE — Progress Notes (Signed)
Advanced Heart Failure Team Progress Note   Primary Physician: Reubin Milan, MD PCP-Cardiologist:  None  Reason for Consultation: Acute systolic HF  Interval history   Remains on milrinone 0.25, IV lasix at 20/hr and heparin. Co-ox 76%. CVP 6 K 3.0   Scr 2.75 -> 2.42  Developed AF this am ~720.   Says she is feeling better. Less SOB. No further CP. No orthopnea or PND.  Objective:    Vital Signs:   Temp:  [97.9 F (36.6 C)-98.8 F (37.1 C)] 98.1 F (36.7 C) (06/05 0803) Pulse Rate:  [56-94] 94 (06/05 0803) Resp:  [11-18] 17 (06/05 0803) BP: (93-131)/(39-80) 119/66 (06/05 0803) SpO2:  [91 %-99 %] 99 % (06/05 0845) Weight:  [62 kg] 62 kg (06/05 0410)    Weight change: Filed Weights   03/29/23 1458 03/30/23 0427 03/31/23 0410  Weight: 61.3 kg 61.1 kg 62 kg    Intake/Output:   Intake/Output Summary (Last 24 hours) at 03/31/2023 1423 Last data filed at 03/31/2023 1420 Gross per 24 hour  Intake 1406.77 ml  Output 1880 ml  Net -473.23 ml       Physical Exam    General:  Sitting up in chair No resp difficulty HEENT: normal Neck: supple. LIJ TLC no JVD. Carotids 2+ bilat; no bruits. No lymphadenopathy or thryomegaly appreciated. Cor: PMI nondisplaced. Irregular rate & rhythm. 2/6 TR Lungs: clear Abdomen: soft, nontender, nondistended. No hepatosplenomegaly. No bruits or masses. Good bowel sounds. Extremities: no cyanosis, clubbing, rash, edema Neuro: alert & orientedx3, cranial nerves grossly intact. moves all 4 extremities w/o difficulty. Affect pleasant   Telemetry   AF 80-90s Personally reviewed  Labs   Basic Metabolic Panel: Recent Labs  Lab 03/27/23 0437 03/28/23 0531 03/29/23 0412 03/29/23 2315 03/30/23 0345 03/30/23 1423 03/31/23 0446  NA 126* 125* 127* 127* 127* 126* 130*  K 4.4 3.9 3.7 4.0 3.6 3.6 3.0*  CL 97* 94* 95* 93* 96* 93* 98  CO2 15* 18* 21* 19* 21* 22 22  GLUCOSE 151* 218* 118* 241* 224* 298* 125*  BUN 60* 76* 87* 92*  92* 90* 79*  CREATININE 2.59* 2.84* 2.89* 2.79* 2.69* 2.75* 2.42*  CALCIUM 8.4* 7.9* 7.7* 7.8* 7.5* 7.5* 7.9*  MG 2.1 2.1  --   --  2.2  --  2.2  PHOS  --  5.9*  --   --   --   --   --      Liver Function Tests: Recent Labs  Lab 03/25/23 1001 03/29/23 1612  AST 22  --   ALT 16  --   ALKPHOS 73  --   BILITOT 0.6  --   PROT 6.2* 5.3*  ALBUMIN 3.5  --     No results for input(s): "LIPASE", "AMYLASE" in the last 168 hours. No results for input(s): "AMMONIA" in the last 168 hours.  CBC: Recent Labs  Lab 03/27/23 0437 03/28/23 0531 03/29/23 0412 03/29/23 2315 03/31/23 0446  WBC 21.9* 15.2* 15.6* 11.9* 10.2  HGB 9.5* 9.4* 9.7* 9.3* 8.5*  HCT 28.8* 28.8* 29.4* 27.8* 25.3*  MCV 88.3 87.0 86.2 86.6 86.9  PLT 235 225 242 228 196     Cardiac Enzymes: No results for input(s): "CKTOTAL", "CKMB", "CKMBINDEX", "TROPONINI" in the last 168 hours.  BNP: BNP (last 3 results) Recent Labs    03/25/23 1001  BNP 971.3*     ProBNP (last 3 results) No results for input(s): "PROBNP" in the last 8760 hours.   CBG:  Recent Labs  Lab 03/30/23 1122 03/30/23 1536 03/30/23 2125 03/31/23 0750 03/31/23 1143  GLUCAP 168* 297* 251* 99 155*     Coagulation Studies: No results for input(s): "LABPROT", "INR" in the last 72 hours.   Imaging   No results found.   Medications:     Current Medications:  arformoterol  15 mcg Nebulization BID   aspirin EC  81 mg Oral Daily   Chlorhexidine Gluconate Cloth  6 each Topical Daily   cyanocobalamin  1,000 mcg Oral Daily   feeding supplement (GLUCERNA SHAKE)  237 mL Oral TID BM   gabapentin  100 mg Oral QHS   insulin aspart  0-15 Units Subcutaneous TID WC   insulin aspart  0-5 Units Subcutaneous QHS   insulin glargine-yfgn  15 Units Subcutaneous QHS   multivitamin  1 tablet Oral BID   potassium chloride  10 mEq Oral BID   promethazine  6.25 mg Oral QHS   revefenacin  175 mcg Nebulization Daily   rosuvastatin  10 mg Oral Once  per day on Mon Wed Fri Sat   [START ON 04/01/2023] torsemide  40 mg Oral BID    Infusions:  amiodarone     Followed by   amiodarone     heparin 750 Units/hr (03/31/23 0750)   milrinone 0.125 mcg/kg/min (03/31/23 1335)   norepinephrine (LEVOPHED) Adult infusion        Assessment/Plan   1. Acute systolic HF -> cardiogenic shock - Echo 2023 EF 55-60% - Echo this admit read as EF 30-35%. RV ok  -> I reviewed. EF 35-40% there is severe HK of basaliar to mid lateral wall and inferolateral wall - High-sensitivity troponin 225 -> 458 - >1186 -> 1165.  BNP 971  - Suspect this is an ischemic CM  - Developed low output HF and elevated volume status. Initial co-ox 44% CVP 21 (03/29/23) - Now improving with milrinone and IV lasix - CVP 6 co-ox 76% - Will drop milrinone to 0.125. Change IV lasix to po torsemide - Eventually will need coronary if renal function improves  2. NSTEMI - echo as above with RWMA - no longer having CP today - continue ASA/statin/heparin for now  3. AKI on CKD 3a - likely ATN/cardiorenal  - Scr 1.5 -> 2.8 -> 2.7 -> 2.4 - Continue inotropic support for acute HF and follow renal function - renal u/s 3/21 with medico-renal disease. May be worth repeating - renal following  4. Acute hypoxic respiratory failure - likely due primarily to HF and pleural effusions on top of underlying COPD - s/p R thora on 6/3 with 700cc out  - Repeat CXR in am to reassess  5. PAF  - back in AF - Start amio IV - Continue heparin   6. Hypervolemic hyponatremia - restrict FW, treat HF - improving  7. DNR/DNI/no dialysis - confirmed with patient and her daughter  D/w daughter at bedside  CRITICAL CARE Performed by: Arvilla Meres  Total critical care time: 40 minutes  Critical care time was exclusive of separately billable procedures and treating other patients.  Critical care was necessary to treat or prevent imminent or life-threatening deterioration.  Critical  care was time spent personally by me (independent of midlevel providers or residents) on the following activities: development of treatment plan with patient and/or surrogate as well as nursing, discussions with consultants, evaluation of patient's response to treatment, examination of patient, obtaining history from patient or surrogate, ordering and performing treatments and interventions, ordering and review  of laboratory studies, ordering and review of radiographic studies, pulse oximetry and re-evaluation of patient's condition.   Length of Stay: 6  Arvilla Meres, MD  03/31/2023, 2:23 PM  Advanced Heart Failure Team Pager [phone] (M-F; 7a - 5p)  Please contact [facility] for night-coverage after hours (4p -7a ) and weekends on [email or web address]

## 2023-03-31 NOTE — TOC Initial Note (Signed)
Transition of Care [facility]) - Initial/Assessment Note    Patient Details  Name: Jill Hudson MRN: [id number] Date of Birth: 1935-06-08  Transition of Care [facility]) CM/SW Contact:    Darolyn Rua, LCSW Phone Number: 03/31/2023, 9:55 AM  Clinical Narrative:                  CSW spoke with patient's son Jill Hudson regarding PT recs of SNF, he reports he anticipated this and states they are in agreement. Patient has mentioned [facility] in the past since she lives in [place], agreeable for bed search to be started. CSW will provide bed offers to patient and family once available.   CSW has sent referrals pending bed offers.  Expected Discharge Plan: Skilled Nursing Facility Barriers to Discharge: Continued Medical Work up   Patient Goals and CMS Choice Patient states their goals for this hospitalization and ongoing recovery are:: to go home CMS Medicare.gov Compare Post Acute Care list provided to:: Patient Represenative (must comment) (son Jill Hudson)        Expected Discharge Plan and Services       Living arrangements for the past 2 months: Single Family Home                                      Prior Living Arrangements/Services Living arrangements for the past 2 months: Single Family Home Lives with:: Relatives                   Activities of Daily Living Home Assistive Devices/Equipment: Eyeglasses, Nebulizer ADL Screening (condition at time of admission) Patient's cognitive ability adequate to safely complete daily activities?: Yes Is the patient deaf or have difficulty hearing?: No Does the patient have difficulty seeing, even when wearing glasses/contacts?: No Does the patient have difficulty concentrating, remembering, or making decisions?: No Patient able to express need for assistance with ADLs?: Yes Does the patient have difficulty dressing or bathing?: No Independently performs ADLs?: Yes (appropriate for developmental age) Does the patient have difficulty  walking or climbing stairs?: No Weakness of Legs: Both Weakness of Arms/Hands: None  Permission Sought/Granted                  Emotional Assessment              Admission diagnosis:  COPD exacerbation (HCC) [J44.1] Chest pain [R07.9] Community acquired pneumonia, unspecified laterality [J18.9] Patient Active Problem List   Diagnosis Date Noted   Cardiogenic shock (HCC) 03/28/2023   Chest pain 03/27/2023   COPD exacerbation (HCC) 03/25/2023   Acute on chronic diastolic CHF (congestive heart failure) (HCC) 03/25/2023   HLD (hyperlipidemia) 03/25/2023   Type II diabetes mellitus with renal manifestations (HCC) 03/25/2023   CAD (coronary artery disease) 03/25/2023   Myocardial injury 03/25/2023   Severe sepsis (HCC) 03/25/2023   Iron deficiency anemia 03/25/2023   Chronic kidney disease, stage 3a (HCC) 03/25/2023   PAD (peripheral artery disease) (HCC) 03/25/2023   Acquired thrombophilia (HCC) 03/20/2021   Mood disorder (HCC) 05/02/2020   Chronic heart failure with preserved ejection fraction (HCC) 04/12/2020   Diverticulosis of large intestine without perforation or abscess with bleeding 03/27/2020   Atrial fibrillation with RVR (HCC)    Acute combined systolic and diastolic heart failure (HCC)    COPD (chronic obstructive pulmonary disease) with chronic bronchitis    Acute respiratory failure with hypoxia (HCC) 02/18/2020   CKD (chronic  kidney disease), stage IIIa 02/18/2020   Hyponatremia 02/18/2020   Malnutrition of mild degree (HCC) 01/08/2020   AKI (acute kidney injury) (HCC)    Age-related macular degeneration, dry, left eye 06/21/2019   Age-related macular degeneration, wet, right eye (HCC) 06/21/2019   Moderate nonproliferative diabetic retinopathy associated with type 2 diabetes mellitus (HCC) 06/21/2019   Lumbosacral radiculopathy at L4 05/01/2019   Underweight 05/01/2019   Encounter for long-term (current) use of aspirin 10/31/2018   Long term current  use of oral hypoglycemic drug 10/31/2018   Encounter for long-term (current) use of insulin (HCC) 10/31/2018   Peptic ulcer disease 08/11/2018   Leg pain 07/03/2017   Carpal tunnel syndrome on both sides 05/05/2017   Myalgia due to HMG CoA reductase inhibitor 05/05/2017   History of CVA (cerebrovascular accident) 02/15/2017   Degenerative disc disease, lumbar 12/21/2016   Atherosclerosis of native arteries of extremity with intermittent claudication (HCC) 12/20/2016   Bilateral carotid artery stenosis 12/20/2016   Occlusion and stenosis of bilateral carotid arteries 12/20/2016   Hip bursitis 05/18/2016   Elevated TSH 01/18/2016   CKD stage 3 due to type 2 diabetes mellitus (HCC) 01/16/2016   Senile ecchymosis 01/16/2016   Type II diabetes mellitus with complication (HCC)    GERD (gastroesophageal reflux disease) 07/24/2015   PVD (peripheral vascular disease) (HCC) 05/17/2015   Neoplasm of uncertain behavior of skin 05/17/2015   Retinopathy, diabetic, proliferative (HCC) 04/11/2015   Hyperlipidemia associated with type 2 diabetes mellitus (HCC) 04/11/2015   Essential hypertension 04/11/2015   Generalized OA 04/11/2015   Proliferative diabetic retinopathy(362.02) 04/11/2015   Arteriosclerosis of coronary artery 05/29/2013   Hypertensive heart disease without CHF 05/29/2013   PCP:  Reubin Milan, MD Pharmacy:   [facility] [id number] [facility], [place] - [address] AT [facility] OF [address] [place] [address] Phone: [phone] Fax: [phone]  [facility] [place] - [address] [place] [address] Phone: [phone] Fax: [phone]     Social Determinants of Health (SDOH) Social History: SDOH Screenings   Food Insecurity: No Food Insecurity (03/25/2023)  Housing: Low Risk  (03/25/2023)  Transportation Needs: No Transportation Needs (03/25/2023)  Utilities: Not At Risk (03/25/2023)  Alcohol Screen: Low Risk   (03/11/2022)  Depression (PHQ2-9): High Risk (01/01/2023)  Financial Resource Strain: Low Risk  (01/01/2023)  Physical Activity: Insufficiently Active (03/11/2022)  Social Connections: Socially Integrated (05/12/2022)  Stress: No Stress Concern Present (03/11/2022)  Tobacco Use: Medium Risk (03/25/2023)   SDOH Interventions:     Readmission Risk Interventions     No data to display

## 2023-03-31 NOTE — Assessment & Plan Note (Addendum)
Cardiology took off milrinone drip 6/7.  Not on any pressors at this point.

## 2023-03-31 NOTE — Progress Notes (Signed)
Occupational Therapy Treatment Patient Details Name: Jill Hudson MRN: [id number] DOB: [birth year] Today's Date: 03/31/2023   History of present illness presented to ER secondary to progressive SOB; admitted for management of A/C CHF exacerbation, AECOPD.  s/p thoracentesis (6/3) with removal of fluid; pending cardiac cath and TEE   OT comments  Jill Hudson seen for OT treatment on this date. Upon arrival to room pt seated in recliner with PT finishing up session, agreeable to OT Tx session. OT facilitated ADL management as described below. See ADL section for additional details regarding occupational performance. Pt continues to be functionally limited by cardiopulmonary status, decreased activity tolerance, and decreased balance. Pt return verbalizes understanding of education provided t/o session. Pt is progressing toward OT goals and continues to benefit from skilled OT services to maximize return to PLOF and minimize risk of future falls, injury, caregiver burden, and readmission. Will continue to follow POC as written. Discharge recommendation remains appropriate.     Recommendations for follow up therapy are one component of a multi-disciplinary discharge planning process, led by the attending physician.  Recommendations may be updated based on patient status, additional functional criteria and insurance authorization.    Assistance Recommended at Discharge Intermittent Supervision/Assistance  Patient can return home with the following  A little help with walking and/or transfers;A little help with bathing/dressing/bathroom;Assistance with cooking/housework;Assist for transportation;Help with stairs or ramp for entrance   Equipment Recommendations  Other (comment) (defer)    Recommendations for Other Services      Precautions / Restrictions Precautions Precautions: Fall Restrictions Weight Bearing Restrictions: No       Mobility Bed Mobility Overal bed mobility: Modified  Independent             General bed mobility comments: deferred. Pt up in recliner at start/end of session.    Transfers Overall transfer level: Needs assistance Equipment used: Rolling walker (2 wheels) Transfers: Sit to/from Stand Sit to Stand: Min assist           General transfer comment: Increased assist for STS from recliner.     Balance Overall balance assessment: Needs assistance Sitting-balance support: Feet unsupported Sitting balance-Leahy Scale: Good Sitting balance - Comments: able to sit unsupported   Standing balance support: Bilateral upper extremity supported, During functional activity, Reliant on assistive device for balance Standing balance-Leahy Scale: Fair Standing balance comment: RW needed with external min A for balance and safety                           ADL either performed or assessed with clinical judgement   ADL Overall ADL's : Needs assistance/impaired     Grooming: Standing;Sitting;Wash/dry face;Oral care;Set up;Minimal assistance Grooming Details (indicate cue type and reason): Attempted standing grooming at tray. Pt generally requires MIN A to maintain static standing balance and has difficulty completing functional tasks wihtout UE support.                             Functional mobility during ADLs: Minimal assistance;Rolling walker (2 wheels) General ADL Comments: MIN A for STS, increased assistance for balance with standing functional activity. Pt fatigues quickly benefits from energy conservation education during ADL management.    Extremity/Trunk Assessment              Vision Patient Visual Report: No change from baseline     Perception     Praxis  Cognition Arousal/Alertness: Awake/alert Behavior During Therapy: WFL for tasks assessed/performed Overall Cognitive Status: Within Functional Limits for tasks assessed                                          Exercises  Other Exercises Other Exercises: Pt educated on energy conservation strategies and safe transfer technique during functional activity as described above.    Shoulder Instructions       General Comments      Pertinent Vitals/ Pain       Pain Assessment Pain Assessment: No/denies pain  Home Living                                          Prior Functioning/Environment              Frequency  Min 2X/week        Progress Toward Goals  OT Goals(current goals can now be found in the care plan section)  Progress towards OT goals: Progressing toward goals  Acute Rehab OT Goals Patient Stated Goal: to feel stronger and go home OT Goal Formulation: With patient Time For Goal Achievement: 04/12/23 Potential to Achieve Goals: Good  Plan Frequency remains appropriate;Discharge plan remains appropriate    Co-evaluation                 AM-PAC OT "6 Clicks" Daily Activity     Outcome Measure   Help from another person eating meals?: None Help from another person taking care of personal grooming?: A Little Help from another person toileting, which includes using toliet, bedpan, or urinal?: A Little Help from another person bathing (including washing, rinsing, drying)?: A Lot Help from another person to put on and taking off regular upper body clothing?: A Little Help from another person to put on and taking off regular lower body clothing?: A Lot 6 Click Score: 17    End of Session Equipment Utilized During Treatment: Rolling walker (2 wheels);Oxygen  OT Visit Diagnosis: Unsteadiness on feet (R26.81);Muscle weakness (generalized) (M62.81);History of falling (Z91.81)   Activity Tolerance Patient limited by fatigue   Patient Left in bed;with call bell/phone within reach;with bed alarm set   Nurse Communication Mobility status        Time: 0272-5366 OT Time Calculation (min): 25 min  Charges: OT General Charges $OT Visit: 1 Visit OT  Treatments $Self Care/Home Management : 23-37 mins  Rockney Ghee, M.S., OTR/L 03/31/23, 1:30 PM

## 2023-03-31 NOTE — Progress Notes (Signed)
[facility]  ROUNDING NOTE   Subjective:   Jill Hudson was admitted to [facility] on 03/25/2023 for COPD exacerbation [facility]) [J44.1] Chest pain [R07.9] Community acquired pneumonia, unspecified laterality [J18.9]  milrinone and furosemide gtt.   UOP .   Creatinine 2.42 (2.75)  Objective:  Vital signs in last 24 hours:  Temp:  [97.9 F (36.6 C)-98.8 F (37.1 C)] 98.1 F (36.7 C) (06/05 0803) Pulse Rate:  [56-94] 94 (06/05 0803) Resp:  [11-19] 17 (06/05 0803) BP: (93-131)/(39-95) 119/66 (06/05 0803) SpO2:  [91 %-99 %] 99 % (06/05 0845) Weight:  [62 kg] 62 kg (06/05 0410)  Weight change: 0.7 kg Filed Weights   03/29/23 1458 03/30/23 0427 03/31/23 0410  Weight: 61.3 kg 61.1 kg 62 kg    Intake/Output: I/O last 3 completed shifts: In: 1314.8 [P.O.:750; I.V.:564.8] Out: 2675 [Urine:2675]   Intake/Output this shift:  No intake/output data recorded.  Physical Exam: General: NAD, sitting in chair  Head: Normocephalic, atraumatic. Moist oral mucosal membranes  Eyes: Anicteric  Lungs:  Basilar crackles L>R  Heart: Regular rate and rhythm  Abdomen:  Soft, nontender,   Extremities:  no peripheral edema.  Neurologic: Nonfocal, moving all four extremities  Skin: No lesions        Basic Metabolic Panel: Recent Labs  Lab 03/27/23 0437 03/28/23 0531 03/29/23 0412 03/29/23 2315 03/30/23 0345 03/30/23 1423 03/31/23 0446  NA 126* 125* 127* 127* 127* 126* 130*  K 4.4 3.9 3.7 4.0 3.6 3.6 3.0*  CL 97* 94* 95* 93* 96* 93* 98  CO2 15* 18* 21* 19* 21* 22 22  GLUCOSE 151* 218* 118* 241* 224* 298* 125*  BUN 60* 76* 87* 92* 92* 90* 79*  CREATININE 2.59* 2.84* 2.89* 2.79* 2.69* 2.75* 2.42*  CALCIUM 8.4* 7.9* 7.7* 7.8* 7.5* 7.5* 7.9*  MG 2.1 2.1  --   --  2.2  --  2.2  PHOS  --  5.9*  --   --   --   --   --      Liver Function Tests: Recent Labs  Lab 03/25/23 1001 03/29/23 1612  AST 22  --   ALT 16  --   ALKPHOS 73  --   BILITOT 0.6  --   PROT  6.2* 5.3*  ALBUMIN 3.5  --     No results for input(s): "LIPASE", "AMYLASE" in the last 168 hours. No results for input(s): "AMMONIA" in the last 168 hours.  CBC: Recent Labs  Lab 03/27/23 0437 03/28/23 0531 03/29/23 0412 03/29/23 2315 03/31/23 0446  WBC 21.9* 15.2* 15.6* 11.9* 10.2  HGB 9.5* 9.4* 9.7* 9.3* 8.5*  HCT 28.8* 28.8* 29.4* 27.8* 25.3*  MCV 88.3 87.0 86.2 86.6 86.9  PLT 235 225 242 228 196     Cardiac Enzymes: No results for input(s): "CKTOTAL", "CKMB", "CKMBINDEX", "TROPONINI" in the last 168 hours.  BNP: Invalid input(s): "POCBNP"  CBG: Recent Labs  Lab 03/30/23 0727 03/30/23 1122 03/30/23 1536 03/30/23 2125 03/31/23 0750  GLUCAP 180* 168* 297* 251* 99     Microbiology: Results for orders placed or performed during the hospital encounter of 03/25/23  Blood Culture (routine x 2)     Status: None   Collection Time: 03/25/23 12:09 PM   Specimen: BLOOD  Result Value Ref Range Status   Specimen Description BLOOD LEFT [facility]  Final   Special Requests   Final    BOTTLES DRAWN AEROBIC AND ANAEROBIC Blood Culture adequate volume   Culture   Final  NO GROWTH 5 DAYS Performed at [facility], [address] [place], [place] [address]    Report Status 03/30/2023 FINAL  Final  Blood Culture (routine x 2)     Status: None   Collection Time: 03/25/23 12:10 PM   Specimen: BLOOD  Result Value Ref Range Status   Specimen Description BLOOD LEFT FA  Final   Special Requests   Final    BOTTLES DRAWN AEROBIC AND ANAEROBIC Blood Culture adequate volume   Culture   Final    NO GROWTH 5 DAYS Performed at [facility], [address], [place], [place] [address]    Report Status 03/30/2023 FINAL  Final  SARS Coronavirus 2 by RT PCR (hospital order, performed in [facility] hospital lab) *cepheid single result test* Anterior Nasal Swab     Status: None   Collection Time: 03/25/23  1:18 PM   Specimen: Anterior Nasal Swab  Result Value Ref Range  Status   SARS Coronavirus 2 by RT PCR NEGATIVE NEGATIVE Final    Comment: (NOTE) SARS-CoV-2 target nucleic acids are NOT DETECTED.  The SARS-CoV-2 RNA is generally detectable in upper and lower respiratory specimens during the acute phase of infection. The lowest concentration of SARS-CoV-2 viral copies this assay can detect is 250 copies / mL. A negative result does not preclude SARS-CoV-2 infection and should not be used as the sole basis for treatment or other patient management decisions.  A negative result may occur with improper specimen collection / handling, submission of specimen other than nasopharyngeal swab, presence of viral mutation(s) within the areas targeted by this assay, and inadequate number of viral copies (<250 copies / mL). A negative result must be combined with clinical observations, patient history, and epidemiological information.  Fact Sheet for Patients:   RoadLapTop.co.za  Fact Sheet for Healthcare Providers: http://kim-miller.com/  This test is not yet approved or  cleared by the [place] FDA and has been authorized for detection and/or diagnosis of SARS-CoV-2 by FDA under an Emergency Use Authorization (EUA).  This EUA will remain in effect (meaning this test can be used) for the duration of the COVID-19 declaration under Section 564(b)(1) of the Act, 21 U.S.C. section 360bbb-3(b)(1), unless the authorization is terminated or revoked sooner.  Performed at [facility], [address] [place], [place] [address]   MRSA Next Gen by PCR, Nasal     Status: None   Collection Time: 03/27/23  4:40 AM   Specimen: Nasal Mucosa; Nasal Swab  Result Value Ref Range Status   MRSA by PCR Next Gen NOT DETECTED NOT DETECTED Final    Comment: (NOTE) The GeneXpert MRSA Assay (FDA approved for NASAL specimens only), is one component of a comprehensive MRSA colonization surveillance program. It is not  intended to diagnose MRSA infection nor to guide or monitor treatment for MRSA infections. Test performance is not FDA approved in patients less than 61 years old. Performed at [facility], [address] [place], [place] [address]   Respiratory (~20 pathogens) panel by PCR     Status: None   Collection Time: 03/28/23  4:00 PM   Specimen: Nasopharyngeal Swab; Respiratory  Result Value Ref Range Status   Adenovirus NOT DETECTED NOT DETECTED Final   Coronavirus 229E NOT DETECTED NOT DETECTED Final    Comment: (NOTE) The Coronavirus on the Respiratory Panel, DOES NOT test for the novel  Coronavirus (2019 nCoV)    Coronavirus HKU1 NOT DETECTED NOT DETECTED Final   Coronavirus  NL63 NOT DETECTED NOT DETECTED Final   Coronavirus OC43 NOT DETECTED NOT DETECTED Final   Metapneumovirus NOT DETECTED NOT DETECTED Final   Rhinovirus / Enterovirus NOT DETECTED NOT DETECTED Final   Influenza A NOT DETECTED NOT DETECTED Final   Influenza B NOT DETECTED NOT DETECTED Final   Parainfluenza Virus 1 NOT DETECTED NOT DETECTED Final   Parainfluenza Virus 2 NOT DETECTED NOT DETECTED Final   Parainfluenza Virus 3 NOT DETECTED NOT DETECTED Final   Parainfluenza Virus 4 NOT DETECTED NOT DETECTED Final   Respiratory Syncytial Virus NOT DETECTED NOT DETECTED Final   Bordetella pertussis NOT DETECTED NOT DETECTED Final   Bordetella Parapertussis NOT DETECTED NOT DETECTED Final   Chlamydophila pneumoniae NOT DETECTED NOT DETECTED Final   Mycoplasma pneumoniae NOT DETECTED NOT DETECTED Final    Comment: Performed at [facility], [address], [place], [place] [address]  Body fluid culture w Gram Stain     Status: None (Preliminary result)   Collection Time: 03/29/23 10:27 AM   Specimen: PATH Cytology Pleural fluid  Result Value Ref Range Status   Specimen Description   Final    PLEURAL Performed at [facility], [address], [place], [place] [address]    Special Requests    Final    NONE Performed at [facility], [address] [place], [place] [address]    Gram Stain NO WBC SEEN NO ORGANISMS SEEN   Final   Culture   Final    NO GROWTH < 24 HOURS Performed at [facility], [address], [place], [place] [address]    Report Status PENDING  Incomplete  MRSA Next Gen by PCR, Nasal     Status: None   Collection Time: 03/29/23  3:00 PM   Specimen: Nasal Mucosa; Nasal Swab  Result Value Ref Range Status   MRSA by PCR Next Gen NOT DETECTED NOT DETECTED Final    Comment: (NOTE) The GeneXpert MRSA Assay (FDA approved for NASAL specimens only), is one component of a comprehensive MRSA colonization surveillance program. It is not intended to diagnose MRSA infection nor to guide or monitor treatment for MRSA infections. Test performance is not FDA approved in patients less than 65 years old. Performed at [facility], [address] [place], [place] [address]   Group A Strep by PCR     Status: None   Collection Time: 03/30/23  6:00 PM   Specimen: Throat; Sterile Swab  Result Value Ref Range Status   Group A Strep by PCR NOT DETECTED NOT DETECTED Final    Comment: Performed at [facility], [address] [place], [place] [address]    Coagulation Studies: No results for input(s): "LABPROT", "INR" in the last 72 hours.   Urinalysis: No results for input(s): "COLORURINE", "LABSPEC", "PHURINE", "GLUCOSEU", "HGBUR", "BILIRUBINUR", "KETONESUR", "PROTEINUR", "UROBILINOGEN", "NITRITE", "LEUKOCYTESUR" in the last 72 hours.  Invalid input(s): "APPERANCEUR"    Imaging: DG Chest Port 1 View  Result Date: 03/29/2023 CLINICAL DATA:  Line placement EXAM: PORTABLE CHEST 1 VIEW COMPARISON:  X-ray 03/29/2023 FINDINGS: Calcified aorta. Stable cardiopericardial silhouette. New left IJ catheter with tip at the SVC right atrial junction region. Small pleural effusions are seen, left-greater-than-right with the adjacent opacities. No  pneumothorax or edema. Overlapping cardiac leads. Osteopenia. IMPRESSION: Left IJ catheter in place.  No pneumothorax. Electronically Signed   By: Karen Kays M.D.   On: 03/29/2023 16:19   US THORACENTESIS ASP PLEURAL SPACE W/IMG GUIDE  Result Date: 03/29/2023 INDICATION: Bilateral pleural effusions. Request received for diagnostic and therapeutic thoracentesis EXAM: ULTRASOUND GUIDED RIGHT THORACENTESIS MEDICATIONS: 4 cc 1% lidocaine COMPLICATIONS: None immediate. PROCEDURE: An ultrasound guided thoracentesis was thoroughly discussed with the patient and questions answered. The benefits, risks, alternatives and complications were also discussed. The patient understands and wishes to proceed with the procedure. Written consent was obtained. Ultrasound was performed to localize and mark an adequate pocket of fluid in the right chest. The area was then prepped and draped in the normal sterile fashion. 1% Lidocaine was used for local anesthesia. Under ultrasound guidance a 6 Fr Safe-T-Centesis catheter was introduced. Thoracentesis was performed. The catheter was removed and a dressing applied. FINDINGS: A total of approximately 700 mL of pale yellow fluid was removed. Samples were sent to the laboratory as requested by the clinical team. IMPRESSION: Successful ultrasound guided right thoracentesis yielding 700 mL of pleural fluid. Follow-up chest x-ray revealed no evidence of pneumothorax. Procedure performed by Mina Marble, PA-C Electronically Signed   By: Malachy Moan M.D.   On: 03/29/2023 13:25   DG Chest Port 1 View  Result Date: 03/29/2023 CLINICAL DATA:  142230 Pleural effusion 142230 EXAM: PORTABLE CHEST - 1 VIEW COMPARISON:  03/25/2023 FINDINGS: New moderate left pleural effusion with adjacent opacities in the left lower lung. Right lung remains clear. Heart size upper limits normal. Aortic Atherosclerosis (ICD10-170.0). No pneumothorax. Visualized bones unremarkable. IMPRESSION: New moderate  left pleural effusion. Electronically Signed   By: Corlis Leak M.D.   On: 03/29/2023 10:48     Medications:    furosemide (LASIX) 200 mg in dextrose 5 % 100 mL (2 mg/mL) infusion 20 mg/hr (03/31/23 0600)   heparin 750 Units/hr (03/31/23 0750)   milrinone 0.25 mcg/kg/min (03/31/23 0600)   norepinephrine (LEVOPHED) Adult infusion      arformoterol  15 mcg Nebulization BID   aspirin EC  81 mg Oral Daily   Chlorhexidine Gluconate Cloth  6 each Topical Daily   cyanocobalamin  1,000 mcg Oral Daily   feeding supplement (GLUCERNA SHAKE)  237 mL Oral TID BM   gabapentin  100 mg Oral QHS   insulin aspart  0-15 Units Subcutaneous TID WC   insulin aspart  0-5 Units Subcutaneous QHS   insulin glargine-yfgn  15 Units Subcutaneous QHS   multivitamin  1 tablet Oral BID   potassium chloride  10 mEq Oral BID   potassium chloride  40 mEq Oral Once   promethazine  6.25 mg Oral QHS   revefenacin  175 mcg Nebulization Daily   rosuvastatin  10 mg Oral Once per day on Mon Wed Fri Sat   sodium chloride  1 g Oral BID WC   acetaminophen, alum & mag hydroxide-simeth, dextromethorphan-guaiFENesin, hydrALAZINE, levalbuterol, melatonin, ondansetron (ZOFRAN) IV, mouth rinse, oxyCODONE, phenol, senna-docusate, traZODone  Assessment/ Plan:  Ms. KULSOOM BYSTROM is a 88 y.o.  female with diabetes mellitus type II, hypertension, coronary artery disease, peripheral arterial disease, tremor, CVA, congestive heart failure and anemia who is admitted to [facility] on 03/25/2023 for COPD exacerbation (HCC) [J44.1] Chest pain [R07.9] Community acquired pneumonia, unspecified laterality [J18.9]  Acute kidney injury with acute metabolic acidosis on chronic kidney disease stage IIIB: baseline creatinine of 1.48, GFR of 34 on 12/09/2022. Acute kidney injury secondary to acute cardiorenal syndrome . Chronic kidney disease secondary to hypertension.   Hypertension with chronic kidney disease: with chronic systolic and diastolic congestive  heart failure.  Appreciate cardiology input.  Furosemide gtt  Hyponatremia: -Sodium  slowly improving, 130   LOS: 6 Jaydien Panepinto 6/5/202410:16 AM

## 2023-03-31 NOTE — Assessment & Plan Note (Addendum)
Last hemoglobin 8.9.  Continue to monitor.

## 2023-03-31 NOTE — Assessment & Plan Note (Addendum)
Sodium 131 

## 2023-03-31 NOTE — Assessment & Plan Note (Addendum)
Patient converted to normal sinus rhythm.  Taken off amiodarone drip and switched to oral.  Heparin drip converted over to Eliquis.

## 2023-03-31 NOTE — Progress Notes (Signed)
ANTICOAGULATION CONSULT NOTE  Pharmacy Consult for heparin infusion Indication: ACS/STEMI/ apixaban for Afib PTA  Allergies  Allergen Reactions   Cefdinir Diarrhea   Saxagliptin Diarrhea   Epinephrine Other (See Comments)    Patient does not remember what happens when she uses this   Evolocumab     Pain with injection   Atorvastatin Other (See Comments)    Muscle aches   Codeine Other (See Comments)    Upset stomach   Ezetimibe Other (See Comments)    Myalgias(ZETIA)   Limonene Rash    Patient does not recall this reaction   Nitrofurantoin Rash and Other (See Comments)    Pruitus   Sulfa Antibiotics Rash and Other (See Comments)    Sore mouth     Patient Measurements: Height: 5' (152.4 cm) Weight: 62 kg (136 lb 11 oz) IBW/kg (Calculated) : 45.5 Heparin Dosing Weight: 56.2 kg  Vital Signs: Temp: 98.8 F (37.1 C) (06/05 0000) Temp Source: Oral (06/05 0000) BP: 98/39 (06/05 0400) Pulse Rate: 68 (06/05 0410)  Labs: Recent Labs    03/29/23 0412 03/29/23 1612 03/29/23 2315 03/29/23 2317 03/30/23 0345 03/30/23 1423 03/30/23 2315 03/31/23 0446  HGB 9.7*  --  9.3*  --   --   --   --  8.5*  HCT 29.4*  --  27.8*  --   --   --   --  25.3*  PLT 242  --  228  --   --   --   --  196  APTT 58*   < > 176*  --  197* 94* 77* 111*  HEPARINUNFRC 0.68  --   --  0.94* 0.98*  --   --  0.53  CREATININE 2.89*  --  2.79*  --  2.69* 2.75*  --  2.42*   < > = values in this interval not displayed.     Estimated Creatinine Clearance: 13.7 mL/min (A) (by C-G formula based on SCr of 2.42 mg/dL (H)).   Medical History: Past Medical History:  Diagnosis Date   Acute CHF (congestive heart failure) (HCC) 02/18/2020   Acute diastolic CHF (congestive heart failure) (HCC)    Acute kidney injury superimposed on CKD (HCC)    Allergies    Anxiety    Arthritis    spine and shoulder   Atherosclerosis of artery of extremity with rest pain (HCC) 10/12/2018   Cancer (HCC)    skin   CHF  (congestive heart failure) (HCC)    Diabetes mellitus without complication (HCC)    Diverticulosis of large intestine with hemorrhage    GI bleeding 12/25/2019   Heart murmur    Hyperlipidemia    Hypertension    Macula lutea degeneration    Mitral and aortic valve disease    Myocardial infarction (HCC)    may have had a "light" heart attack   Non-ST elevation (NSTEMI) myocardial infarction (HCC)    Occasional tremors    PAD (peripheral artery disease) (HCC)    Rectal bleeding    Shingles    patient unaware but daughter confirms. it was a long time ago   Stroke [facility]) 01/2017   may have had a slight stroke   TIA (transient ischemic attack) 01/2017   UTI (urinary tract infection)    Vascular disease, peripheral (HCC)     Medications:  PTA Meds: Apixaban 2.5 mg BID, last dose 5/30 @ 1856  Assessment: Pt is a 88 yo female initially presenting to ED w/ COPD exacerbation, now  c/o increase WOB , found with increasing Troponin I levels.  5/31 1428 aPTT=95 Therapeutic x1 5/31 2228 aPTT=120, supratherapeutic 6/1   0802 aPTT=68/HL>1.10   aPTT therapeuticx1 6/1   1358 aPTT= 89  Therapeuticx2 6/2   0531 aPTT = 82, therapeutic x 3 / HL > 1.1, not correlating 6/3   0412 aPTT = 58, subtherapeutic / HL 0.68, not correlating 6/3   1612 aPTT = 40, subtherapeutic 6/3   2315 aPTT = 176,  HL = 0.94 - possible sampling error 6/4   0345 aPTT = 197,  HL = 0.98 - elevated  6/4   1423 aPTT = 94 6/4   2315 aPTT = 77, therapeutic X 2  6/5   0446 aPTT = 111 (elevated), HL = 0.53   Goal of Therapy:  Heparin level 0.3-0.7 units/ml aPTT 66-102 seconds Monitor platelets by anticoagulation protocol: Yes   Plan: 6/5 @ 0446: aPTT = 111,  HL = 0.53 - aPTT elevated, not correlating with HL  - Will decrease heparin drip rate to 750 units/hr and recheck aPTT 8 hrs after rate change. - Will recheck HL on 6/6 with AM labs.  Will follow aPTT until correlation w/ HL confirmed.  HL & CBC daily while on  heparin  Raygan Skarda D Clinical Pharmacist  03/31/2023 5:23 AM

## 2023-03-31 NOTE — Hospital Course (Addendum)
88 year old female with history of COPD, HTN, HLD, DM2, stroke, TIA, GERD, anxiety, PVD, CAD, MI, former tobacco use, CKD stage III, GI bleed, iron deficiency anemia, PAD status post PCI comes to the hospital with shortness of breath and hypoxia. Patient was found to have bilateral pulmonary edema /effusion with elevated BNP.  Patient is admitted for further evaluation.  Cardiology is consulted.  Patient will eventually need right and left heart cath pending improvement in renal functions.   6/5.  Patient feeling a little bit better than when she came in.  On heparin drip, milrinone drip, Lasix drip for cardiogenic shock.  Amiodarone drip added for atrial fibrillation. 6/6.  Now on torsemide instead of Lasix drip.  Creatinine down to 2.16 6/7.  Cardiology decided to discontinue milrinone drip.  Switching off amiodarone drip.  Converting heparin drip over to Eliquis.  Creatinine up to 2.31.  Patient in normal sinus rhythm. 6/8.  Creatinine up to 2.5 and potassium 5.3.  Lokelma ordered.  Potassium supplementation discontinued 6/9.  Creatinine up at 2.63.  Needed in and out catheterization this morning. 6/10.  Creatinine 2.97, transfer out of ICU to progressive care.  Urinating well. 6/11.  Creatinine 3.15 with a GFR 14.  Nephrology not sure if she will tolerate dialysis.  6/24: Stable on 5 L of oxygen, no baseline oxygen use.  Stable renal function, creatinine peaked at 3.8.  Nephrology managing diuretics.  6/25: Remained hemodynamically stable.  Daughter decided to take her back home with hospice help where she is being discharged.  Nephrology would like her to continue furosemide 80 mg twice daily. She will so use 4 to 5 L of oxygen-hospice will arrange.  Patient will continue on current medications and follow-up with her providers for further recommendations.

## 2023-03-31 NOTE — Assessment & Plan Note (Deleted)
Troponin peaked at 1263

## 2023-03-31 NOTE — Assessment & Plan Note (Signed)
Started nebulizers today. 

## 2023-03-31 NOTE — Assessment & Plan Note (Deleted)
Replaced. °

## 2023-03-31 NOTE — Progress Notes (Signed)
ANTICOAGULATION CONSULT NOTE  Pharmacy Consult for heparin infusion Indication: ACS/STEMI/ apixaban for Afib PTA  Allergies  Allergen Reactions   Cefdinir Diarrhea   Saxagliptin Diarrhea   Epinephrine Other (See Comments)    Patient does not remember what happens when she uses this   Evolocumab     Pain with injection   Atorvastatin Other (See Comments)    Muscle aches   Codeine Other (See Comments)    Upset stomach   Ezetimibe Other (See Comments)    Myalgias(ZETIA)   Limonene Rash    Patient does not recall this reaction   Nitrofurantoin Rash and Other (See Comments)    Pruitus   Sulfa Antibiotics Rash and Other (See Comments)    Sore mouth     Patient Measurements: Height: 5' (152.4 cm) Weight: 62 kg (136 lb 11 oz) IBW/kg (Calculated) : 45.5 Heparin Dosing Weight: 56.2 kg  Vital Signs: Temp: 98.1 F (36.7 C) (06/05 0803) Temp Source: Oral (06/05 0803) BP: 119/66 (06/05 0803) Pulse Rate: 94 (06/05 0803)  Labs: Recent Labs    03/29/23 0412 03/29/23 1612 03/29/23 2315 03/29/23 2317 03/30/23 0345 03/30/23 1423 03/30/23 2315 03/31/23 0446 03/31/23 1446  HGB 9.7*  --  9.3*  --   --   --   --  8.5*  --   HCT 29.4*  --  27.8*  --   --   --   --  25.3*  --   PLT 242  --  228  --   --   --   --  196  --   APTT 58*   < > 176*  --  197* 94* 77* 111* 80*  HEPARINUNFRC 0.68  --   --  0.94* 0.98*  --   --  0.53  --   CREATININE 2.89*  --  2.79*  --  2.69* 2.75*  --  2.42*  --    < > = values in this interval not displayed.     Estimated Creatinine Clearance: 13.7 mL/min (A) (by C-G formula based on SCr of 2.42 mg/dL (H)).   Medical History: Past Medical History:  Diagnosis Date   Acute CHF (congestive heart failure) (HCC) 02/18/2020   Acute diastolic CHF (congestive heart failure) (HCC)    Acute kidney injury superimposed on CKD (HCC)    Allergies    Anxiety    Arthritis    spine and shoulder   Atherosclerosis of artery of extremity with rest pain (HCC)  10/12/2018   Cancer (HCC)    skin   CHF (congestive heart failure) (HCC)    Diabetes mellitus without complication (HCC)    Diverticulosis of large intestine with hemorrhage    GI bleeding 12/25/2019   Heart murmur    Hyperlipidemia    Hypertension    Macula lutea degeneration    Mitral and aortic valve disease    Myocardial infarction (HCC)    may have had a "light" heart attack   Non-ST elevation (NSTEMI) myocardial infarction (HCC)    Occasional tremors    PAD (peripheral artery disease) (HCC)    Rectal bleeding    Shingles    patient unaware but daughter confirms. it was a long time ago   Stroke [facility]) 01/2017   may have had a slight stroke   TIA (transient ischemic attack) 01/2017   UTI (urinary tract infection)    Vascular disease, peripheral (HCC)     Medications:  PTA Meds: Apixaban 2.5 mg BID, last dose 5/30 @  1856  Assessment: Pt is a 88 yo female initially presenting to ED w/ COPD exacerbation, now c/o increase WOB , found with increasing Troponin I levels.  5/31 1428 aPTT=95 Therapeutic x1 5/31 2228 aPTT=120, supratherapeutic 6/1   0802 aPTT=68/HL>1.10   aPTT therapeuticx1 6/1   1358 aPTT= 89  Therapeuticx2 6/2   0531 aPTT = 82, therapeutic x 3 / HL > 1.1, not correlating 6/3   0412 aPTT = 58, subtherapeutic / HL 0.68, not correlating 6/3   1612 aPTT = 40, subtherapeutic 6/3   2315 aPTT = 176,  HL = 0.94 - possible sampling error 6/4   0345 aPTT = 197,  HL = 0.98 - elevated  6/4   1423 aPTT = 94 6/4   2315 aPTT = 77, therapeutic X 2  6/5   0446 aPTT = 111 (elevated), HL = 0.53 6/5   1446 aPTT = 80, therapeutic   Goal of Therapy:  Heparin level 0.3-0.7 units/ml aPTT 66-102 seconds Monitor platelets by anticoagulation protocol: Yes   Plan: aPTT therapeutic. heparin level approaching correlation.  --aPTT therapeutic. Continue heparin at 750 units/hr.  --Confirmatory aPTT in 8 hours --Confirmatory heparin level tomorrow AM --CBC daily   Elliot Gurney, PharmD, BCPS Clinical Pharmacist  03/31/2023 3:16 PM

## 2023-03-31 NOTE — Assessment & Plan Note (Addendum)
Patient switched to IV Lasix 80 mg twice a day, hydralazine, Imdur and low-dose Coreg.  No ACE/ARB or spironolactone with her worsening kidney function.

## 2023-03-31 NOTE — Progress Notes (Signed)
Progress Note   Patient: Jill Hudson:[id number] DOB: 22-Jun-1935 DOA: 03/25/2023     6 DOS: the patient was seen and examined on 03/31/2023   Brief hospital course: 88 year old female with history of COPD, HTN, HLD, DM2, stroke, TIA, GERD, anxiety, PVD, CAD, MI, former tobacco use, CKD stage III, GI bleed, iron deficiency anemia, PAD status post PCI comes to the hospital with shortness of breath and hypoxia. Patient was found to have bilateral pulmonary edema /effusion with elevated BNP.  Patient is admitted for further evaluation.  Cardiology is consulted.  Patient will eventually need right and left heart cath pending improvement in renal functions.   6/5.  Patient feeling a little bit better than when she came in.  On heparin drip, milrinone drip, Lasix drip for cardiogenic shock.  Assessment and Plan: * Cardiogenic shock (HCC) Patient on milrinone drip.  Not on any pressors at this point.  Acute on chronic systolic CHF (congestive heart failure) (HCC) Patient on Lasix drip this morning and was switched over to torsemide.  Paroxysmal atrial fibrillation (HCC) Patient placed on amiodarone drip also.  Patient on heparin drip.  Myocardial injury Troponin peaked at 1263  COPD exacerbation (HCC) Continue nebulizers.  Hyponatremia Sodium up to 131.  Hypokalemia Replace potassium  Acute respiratory failure with hypoxia (HCC) Patient initially requiring BiPAP secondary to respiratory distress.  Currently on 2 L.  Acute kidney injury superimposed on CKD (HCC) AKI on CKD stage IIIa.  Creatinine was as high as 2.89.  Last creatinine 2.22        Subjective: Patient feeling a little bit better.  Urinating well.  Breathing a little bit better.  No complaints of chest pain.  Admitted for acute systolic heart failure.  On milrinone drip.  Physical Exam: Vitals:   03/31/23 1400 03/31/23 1500 03/31/23 1600 03/31/23 1700  BP: (!) 125/56 (!) 109/59 (!) 112/48 (!) 124/51  Pulse: (!)  104 85 75 87  Resp: (!) 23 19 19 13   Temp:   98.4 F (36.9 C)   TempSrc:   Oral   SpO2: 97% 97% 98% 100%  Weight:      Height:       Physical Exam HENT:     Head: Normocephalic.     Mouth/Throat:     Pharynx: No oropharyngeal exudate.  Eyes:     General: Lids are normal.     Conjunctiva/sclera: Conjunctivae normal.  Cardiovascular:     Rate and Rhythm: Normal rate and regular rhythm.     Heart sounds: Normal heart sounds, S1 normal and S2 normal.  Pulmonary:     Breath sounds: Examination of the right-lower field reveals decreased breath sounds. Examination of the left-lower field reveals decreased breath sounds. Decreased breath sounds present. No wheezing, rhonchi or rales.  Abdominal:     Palpations: Abdomen is soft.     Tenderness: There is no abdominal tenderness.  Musculoskeletal:     Right lower leg: No swelling.     Left lower leg: No swelling.  Skin:    General: Skin is warm.     Findings: No rash.  Neurological:     Mental Status: She is alert and oriented to person, place, and time.     Data Reviewed: Sodium 131, potassium 3.8 but was 3.0 this morning, creatinine 2.22, hemoglobin 8.5  Family Communication: Spoke with family at the bedside  Disposition: Status is: Inpatient Remains inpatient appropriate because: Continue milrinone drip.  Planned Discharge Destination: Home  Time spent: 28 minutes  Author: Alford Highland, MD 03/31/2023 6:20 PM  For on call review [email or web address].

## 2023-03-31 NOTE — Progress Notes (Signed)
ANTICOAGULATION CONSULT NOTE  Pharmacy Consult for heparin infusion Indication: ACS/STEMI/ apixaban for Afib PTA  Allergies  Allergen Reactions   Cefdinir Diarrhea   Saxagliptin Diarrhea   Epinephrine Other (See Comments)    Patient does not remember what happens when she uses this   Evolocumab     Pain with injection   Atorvastatin Other (See Comments)    Muscle aches   Codeine Other (See Comments)    Upset stomach   Ezetimibe Other (See Comments)    Myalgias(ZETIA)   Limonene Rash    Patient does not recall this reaction   Nitrofurantoin Rash and Other (See Comments)    Pruitus   Sulfa Antibiotics Rash and Other (See Comments)    Sore mouth     Patient Measurements: Height: 5' (152.4 cm) Weight: 62 kg (136 lb 11 oz) IBW/kg (Calculated) : 45.5 Heparin Dosing Weight: 56.2 kg  Vital Signs: Temp: 97.9 F (36.6 C) (06/05 1930) Temp Source: Oral (06/05 1930) BP: 112/51 (06/05 2100) Pulse Rate: 89 (06/05 2100)  Labs: Recent Labs    03/29/23 0412 03/29/23 1612 03/29/23 2315 03/29/23 2317 03/30/23 0345 03/30/23 1423 03/30/23 2315 03/31/23 0446 03/31/23 1446 03/31/23 2134  HGB 9.7*  --  9.3*  --   --   --   --  8.5*  --   --   HCT 29.4*  --  27.8*  --   --   --   --  25.3*  --   --   PLT 242  --  228  --   --   --   --  196  --   --   APTT 58*   < > 176*  --  197* 94*   < > 111* 80* 72*  HEPARINUNFRC 0.68  --   --  0.94* 0.98*  --   --  0.53  --   --   CREATININE 2.89*  --  2.79*  --  2.69* 2.75*  --  2.42* 2.22*  --    < > = values in this interval not displayed.     Estimated Creatinine Clearance: 15 mL/min (A) (by C-G formula based on SCr of 2.22 mg/dL (H)).   Medical History: Past Medical History:  Diagnosis Date   Acute CHF (congestive heart failure) (HCC) 02/18/2020   Acute diastolic CHF (congestive heart failure) (HCC)    Acute kidney injury superimposed on CKD (HCC)    Allergies    Anxiety    Arthritis    spine and shoulder   Atherosclerosis  of artery of extremity with rest pain (HCC) 10/12/2018   Cancer (HCC)    skin   CHF (congestive heart failure) (HCC)    Diabetes mellitus without complication (HCC)    Diverticulosis of large intestine with hemorrhage    GI bleeding 12/25/2019   Heart murmur    Hyperlipidemia    Hypertension    Macula lutea degeneration    Mitral and aortic valve disease    Myocardial infarction (HCC)    may have had a "light" heart attack   Non-ST elevation (NSTEMI) myocardial infarction (HCC)    Occasional tremors    PAD (peripheral artery disease) (HCC)    Rectal bleeding    Shingles    patient unaware but daughter confirms. it was a long time ago   Stroke [facility]) 01/2017   may have had a slight stroke   TIA (transient ischemic attack) 01/2017   UTI (urinary tract infection)  Vascular disease, peripheral (HCC)     Medications:  PTA Meds: Apixaban 2.5 mg BID, last dose 5/30 @ 1856  Assessment: Pt is a 88 yo female initially presenting to ED w/ COPD exacerbation, now c/o increase WOB , found with increasing Troponin I levels.  5/31 1428 aPTT=95 Therapeutic x1 5/31 2228 aPTT=120, supratherapeutic 6/1   0802 aPTT=68/HL>1.10   aPTT therapeuticx1 6/1   1358 aPTT= 89  Therapeuticx2 6/2   0531 aPTT = 82, therapeutic x 3 / HL > 1.1, not correlating 6/3   0412 aPTT = 58, subtherapeutic / HL 0.68, not correlating 6/3   1612 aPTT = 40, subtherapeutic 6/3   2315 aPTT = 176,  HL = 0.94 - possible sampling error 6/4   0345 aPTT = 197,  HL = 0.98 - elevated  6/4   1423 aPTT = 94 6/4   2315 aPTT = 77, therapeutic X 2  6/5   0446 aPTT = 111 (elevated), HL = 0.53 6/5   1446 aPTT = 80, therapeutic 6/5   2134 aPTT = 72, therapeutic X 2    Goal of Therapy:  Heparin level 0.3-0.7 units/ml aPTT 66-102 seconds Monitor platelets by anticoagulation protocol: Yes   Plan:  6/5: aPTT @ 2134 = 72, therapeutic X 2  --aPTT therapeutic X 2. Continue heparin at 750 units/hr. --Check heparin level and aPTT  tomorrow AM --CBC daily   Gray Maugeri D Clinical Pharmacist  03/31/2023 10:21 PM

## 2023-03-31 NOTE — Progress Notes (Signed)
ANTICOAGULATION CONSULT NOTE  Pharmacy Consult for heparin infusion Indication: ACS/STEMI/ apixaban for Afib PTA  Allergies  Allergen Reactions   Cefdinir Diarrhea   Saxagliptin Diarrhea   Epinephrine Other (See Comments)    Patient does not remember what happens when she uses this   Evolocumab     Pain with injection   Atorvastatin Other (See Comments)    Muscle aches   Codeine Other (See Comments)    Upset stomach   Ezetimibe Other (See Comments)    Myalgias(ZETIA)   Limonene Rash    Patient does not recall this reaction   Nitrofurantoin Rash and Other (See Comments)    Pruitus   Sulfa Antibiotics Rash and Other (See Comments)    Sore mouth     Patient Measurements: Height: 5' (152.4 cm) Weight: 61.1 kg (134 lb 11.2 oz) IBW/kg (Calculated) : 45.5 Heparin Dosing Weight: 56.2 kg  Vital Signs: Temp: 97.9 F (36.6 C) (06/04 1900) Temp Source: Oral (06/04 1900) BP: 130/50 (06/04 2300) Pulse Rate: 69 (06/04 2300)  Labs: Recent Labs    03/28/23 0531 03/29/23 0412 03/29/23 1612 03/29/23 2315 03/29/23 2317 03/30/23 0345 03/30/23 1423 03/30/23 2315  HGB 9.4* 9.7*  --  9.3*  --   --   --   --   HCT 28.8* 29.4*  --  27.8*  --   --   --   --   PLT 225 242  --  228  --   --   --   --   APTT 82* 58*   < > 176*  --  197* 94* 77*  HEPARINUNFRC >1.10* 0.68  --   --  0.94* 0.98*  --   --   CREATININE 2.84* 2.89*  --  2.79*  --  2.69* 2.75*  --    < > = values in this interval not displayed.     Estimated Creatinine Clearance: 12 mL/min (A) (by C-G formula based on SCr of 2.75 mg/dL (H)).   Medical History: Past Medical History:  Diagnosis Date   Acute CHF (congestive heart failure) (HCC) 02/18/2020   Acute diastolic CHF (congestive heart failure) (HCC)    Acute kidney injury superimposed on CKD (HCC)    Allergies    Anxiety    Arthritis    spine and shoulder   Atherosclerosis of artery of extremity with rest pain (HCC) 10/12/2018   Cancer (HCC)    skin   CHF  (congestive heart failure) (HCC)    Diabetes mellitus without complication (HCC)    Diverticulosis of large intestine with hemorrhage    GI bleeding 12/25/2019   Heart murmur    Hyperlipidemia    Hypertension    Macula lutea degeneration    Mitral and aortic valve disease    Myocardial infarction (HCC)    may have had a "light" heart attack   Non-ST elevation (NSTEMI) myocardial infarction (HCC)    Occasional tremors    PAD (peripheral artery disease) (HCC)    Rectal bleeding    Shingles    patient unaware but daughter confirms. it was a long time ago   Stroke [facility]) 01/2017   may have had a slight stroke   TIA (transient ischemic attack) 01/2017   UTI (urinary tract infection)    Vascular disease, peripheral (HCC)     Medications:  PTA Meds: Apixaban 2.5 mg BID, last dose 5/30 @ 1856  Assessment: Pt is a 88 yo female initially presenting to ED w/ COPD exacerbation, now  c/o increase WOB , found with increasing Troponin I levels.  5/31 1428 aPTT=95 Therapeutic x1 5/31 2228 aPTT=120, supratherapeutic 6/1   0802 aPTT=68/HL>1.10   aPTT therapeuticx1 6/1   1358 aPTT= 89  Therapeuticx2 6/2   0531 aPTT = 82, therapeutic x 3 / HL > 1.1, not correlating 6/3   0412 aPTT = 58, subtherapeutic / HL 0.68, not correlating 6/3   1612 aPTT = 40, subtherapeutic 6/3   2315 aPTT = 176,  HL = 0.94 - possible sampling error 6/4   0345 aPTT = 197,  HL = 0.98 - elevated  6/4   1423 aPTT = 94 6/4   2315 aPTT = 77, therapeutic X 2    Goal of Therapy:  Heparin level 0.3-0.7 units/ml aPTT 66-102 seconds Monitor platelets by anticoagulation protocol: Yes   Plan: 6/4: aPTT @ 2315 = 77, therapeutic X 2 - Will continue pt on current rate and recheck aPTT and HL on 6/5 with AM labs.  Will follow aPTT until correlation w/ HL confirmed.  HL & CBC daily while on heparin  Abdishakur Gottschall D Clinical Pharmacist  03/31/2023 12:35 AM

## 2023-03-31 NOTE — Progress Notes (Signed)
Daily Progress Note   Patient Name: Jill Hudson       Date: 03/31/2023 DOB: 11-01-1934  Age: 88 y.o. MRN#: [id number] Attending Physician: Alford Highland, MD Primary Care Physician: Reubin Milan, MD Admit Date: 03/25/2023  Reason for Consultation/Follow-up: Establishing goals of care  Subjective: Notes and labs reviewed.  In to see patient.  She is currently sitting in bedside chair with daughter at bedside.  She is able to discuss updates by cardiology.  Revisited conversation from yesterday, and patient states she was able to speak with her son.  She states that she has formally decided that she would not want CPR, would not want to be placed on a ventilator, would not want dialysis, and would not want a feeding tube.  Will need time for outcomes.  I completed a MOST form today and the signed original was placed in the chart. The form was scanned and sent to medical records for it to be uploaded under ACP tab in Epic. A photocopy was also placed in the chart to be scanned into EMR. The patient outlined their wishes for the following treatment decisions:  Cardiopulmonary Resuscitation: Do Not Attempt Resuscitation (DNR/No CPR)  Medical Interventions: Limited Additional Interventions: Use medical treatment, IV fluids and cardiac monitoring as indicated, DO NOT USE intubation or mechanical ventilation. May consider use of less invasive airway support such as BiPAP or CPAP. Also provide comfort measures. Transfer to the hospital if indicated. Avoid intensive care.   Antibiotics: Antibiotics if indicated  IV Fluids: IV fluids for a defined trial period  Feeding Tube: No feeding tube     Length of Stay: 6  Current Medications: Scheduled Meds:   arformoterol  15 mcg Nebulization BID    aspirin EC  81 mg Oral Daily   Chlorhexidine Gluconate Cloth  6 each Topical Daily   cyanocobalamin  1,000 mcg Oral Daily   feeding supplement (GLUCERNA SHAKE)  237 mL Oral TID BM   gabapentin  100 mg Oral QHS   insulin aspart  0-15 Units Subcutaneous TID WC   insulin aspart  0-5 Units Subcutaneous QHS   insulin glargine-yfgn  15 Units Subcutaneous QHS   multivitamin  1 tablet Oral BID   potassium chloride  10 mEq Oral BID   promethazine  6.25 mg Oral QHS  revefenacin  175 mcg Nebulization Daily   rosuvastatin  10 mg Oral Once per day on Mon Wed Fri Sat   [START ON 04/01/2023] torsemide  40 mg Oral BID    Continuous Infusions:  amiodarone 60 mg/hr (03/31/23 1439)   Followed by   amiodarone     heparin 750 Units/hr (03/31/23 0750)   milrinone 0.125 mcg/kg/min (03/31/23 1335)   norepinephrine (LEVOPHED) Adult infusion      PRN Meds: acetaminophen, alum & mag hydroxide-simeth, dextromethorphan-guaiFENesin, hydrALAZINE, levalbuterol, melatonin, ondansetron (ZOFRAN) IV, mouth rinse, oxyCODONE, phenol, senna-docusate, traZODone  Physical Exam          Vital Signs: BP 119/66 (BP Location: Right Arm)   Pulse 94   Temp 98.1 F (36.7 C) (Oral)   Resp 17   Ht 5' (1.524 m)   Wt 62 kg   SpO2 99%   BMI 26.69 kg/m  SpO2: SpO2: 99 % O2 Device: O2 Device: Nasal Cannula O2 Flow Rate: O2 Flow Rate (L/min): 2 L/min  Intake/output summary:  Intake/Output Summary (Last 24 hours) at 03/31/2023 1458 Last data filed at 03/31/2023 1420 Gross per 24 hour  Intake 1406.77 ml  Output 1880 ml  Net -473.23 ml   LBM:   Baseline Weight: Weight: 56.2 kg Most recent weight: Weight: 62 kg       Palliative Assessment/Data:      Patient Active Problem List   Diagnosis Date Noted   Cardiogenic shock (HCC) 03/28/2023   Chest pain 03/27/2023   COPD exacerbation (HCC) 03/25/2023   Acute on chronic diastolic CHF (congestive heart failure) (HCC) 03/25/2023   HLD (hyperlipidemia) 03/25/2023   Type  II diabetes mellitus with renal manifestations (HCC) 03/25/2023   CAD (coronary artery disease) 03/25/2023   Myocardial injury 03/25/2023   Severe sepsis (HCC) 03/25/2023   Iron deficiency anemia 03/25/2023   Chronic kidney disease, stage 3a (HCC) 03/25/2023   PAD (peripheral artery disease) (HCC) 03/25/2023   Acquired thrombophilia (HCC) 03/20/2021   Mood disorder (HCC) 05/02/2020   Chronic heart failure with preserved ejection fraction (HCC) 04/12/2020   Diverticulosis of large intestine without perforation or abscess with bleeding 03/27/2020   Atrial fibrillation with RVR (HCC)    Acute combined systolic and diastolic heart failure (HCC)    COPD (chronic obstructive pulmonary disease) with chronic bronchitis    Acute respiratory failure with hypoxia (HCC) 02/18/2020   CKD (chronic kidney disease), stage IIIa 02/18/2020   Hyponatremia 02/18/2020   Malnutrition of mild degree (HCC) 01/08/2020   AKI (acute kidney injury) (HCC)    Age-related macular degeneration, dry, left eye 06/21/2019   Age-related macular degeneration, wet, right eye (HCC) 06/21/2019   Moderate nonproliferative diabetic retinopathy associated with type 2 diabetes mellitus (HCC) 06/21/2019   Lumbosacral radiculopathy at L4 05/01/2019   Underweight 05/01/2019   Encounter for long-term (current) use of aspirin 10/31/2018   Long term current use of oral hypoglycemic drug 10/31/2018   Encounter for long-term (current) use of insulin (HCC) 10/31/2018   Peptic ulcer disease 08/11/2018   Leg pain 07/03/2017   Carpal tunnel syndrome on both sides 05/05/2017   Myalgia due to HMG CoA reductase inhibitor 05/05/2017   History of CVA (cerebrovascular accident) 02/15/2017   Degenerative disc disease, lumbar 12/21/2016   Atherosclerosis of native arteries of extremity with intermittent claudication (HCC) 12/20/2016   Bilateral carotid artery stenosis 12/20/2016   Occlusion and stenosis of bilateral carotid arteries 12/20/2016    Hip bursitis 05/18/2016   Elevated TSH 01/18/2016  CKD stage 3 due to type 2 diabetes mellitus (HCC) 01/16/2016   Senile ecchymosis 01/16/2016   Type II diabetes mellitus with complication (HCC)    GERD (gastroesophageal reflux disease) 07/24/2015   PVD (peripheral vascular disease) (HCC) 05/17/2015   Neoplasm of uncertain behavior of skin 05/17/2015   Retinopathy, diabetic, proliferative (HCC) 04/11/2015   Hyperlipidemia associated with type 2 diabetes mellitus (HCC) 04/11/2015   Essential hypertension 04/11/2015   Generalized OA 04/11/2015   Proliferative diabetic retinopathy(362.02) 04/11/2015   Arteriosclerosis of coronary artery 05/29/2013   Hypertensive heart disease without CHF 05/29/2013    Palliative Care Assessment & Plan     Recommendations/Plan: DNR/DNI/no dialysis/no feeding tube.  Code Status:    Code Status Orders  (From admission, onward)           Start     Ordered   03/25/23 1214  Full code  Continuous       Question:  By:  Answer:  Consent: discussion documented in EHR   03/25/23 1214           Code Status History     Date Active Date Inactive Code Status Order ID Comments User Context   02/18/2020 1059 02/26/2020 2310 Full Code [id number]  Lorretta Harp, MD ED   12/25/2019 0555 12/28/2019 2204 Full Code [id number]  Mansy, Vernetta Honey, MD ED   10/12/2018 1647 10/15/2018 1808 Full Code [id number]  Schnier, Latina Craver, MD Inpatient   09/16/2018 1254 09/16/2018 2048 Full Code [id number]  Gilda Crease, Latina Craver, MD Inpatient   08/11/2018 1706 08/13/2018 2136 Full Code [id number]  Salary, Evelena Asa, MD Inpatient   07/26/2018 0921 07/26/2018 1652 Full Code [id number]  Renford Dills, MD Inpatient   08/23/2017 0935 08/23/2017 1419 Full Code [id number]  Annice Needy, MD Inpatient   02/15/2017 1538 02/16/2017 1959 Full Code [id number]  Auburn Bilberry, MD Inpatient       Prognosis:  Unable to determine   Thank you for allowing the Palliative Medicine Team to assist in  the care of this patient.    Morton Stall, NP  Please contact Palliative Medicine Team phone at [phone] for questions and concerns.

## 2023-03-31 NOTE — [place] FL2 (Signed)
[place] MEDICAID FL2 LEVEL OF CARE FORM     IDENTIFICATION  Patient Name: Jill Hudson Birthdate: 1935/01/20 Sex: female Admission Date (Current Location): 03/25/2023  [facility] and [place] Number:  Chiropodist and Address:  [facility], [address], [place], [place] [address]      Provider Number: [id number]  Attending Physician Name and Address:  Alford Highland, MD  Relative Name and Phone Number:  [name] (son)  [phone]    Current Level of Care: Hospital Recommended Level of Care: Skilled Nursing Facility Prior Approval Number:    Date Approved/Denied:   PASRR Number: [id number]  Discharge Plan: SNF    Current Diagnoses: Patient Active Problem List   Diagnosis Date Noted   Cardiogenic shock (HCC) 03/28/2023   Chest pain 03/27/2023   COPD exacerbation (HCC) 03/25/2023   Acute on chronic diastolic CHF (congestive heart failure) (HCC) 03/25/2023   HLD (hyperlipidemia) 03/25/2023   Type II diabetes mellitus with renal manifestations (HCC) 03/25/2023   CAD (coronary artery disease) 03/25/2023   Myocardial injury 03/25/2023   Severe sepsis (HCC) 03/25/2023   Iron deficiency anemia 03/25/2023   Chronic kidney disease, stage 3a (HCC) 03/25/2023   PAD (peripheral artery disease) (HCC) 03/25/2023   Acquired thrombophilia (HCC) 03/20/2021   Mood disorder (HCC) 05/02/2020   Chronic heart failure with preserved ejection fraction (HCC) 04/12/2020   Diverticulosis of large intestine without perforation or abscess with bleeding 03/27/2020   Atrial fibrillation with RVR (HCC)    Acute combined systolic and diastolic heart failure (HCC)    COPD (chronic obstructive pulmonary disease) with chronic bronchitis    Acute respiratory failure with hypoxia (HCC) 02/18/2020   CKD (chronic kidney disease), stage IIIa 02/18/2020   Hyponatremia 02/18/2020   Malnutrition of mild degree (HCC) 01/08/2020   AKI (acute kidney injury) (HCC)     Age-related macular degeneration, dry, left eye 06/21/2019   Age-related macular degeneration, wet, right eye (HCC) 06/21/2019   Moderate nonproliferative diabetic retinopathy associated with type 2 diabetes mellitus (HCC) 06/21/2019   Lumbosacral radiculopathy at L4 05/01/2019   Underweight 05/01/2019   Encounter for long-term (current) use of aspirin 10/31/2018   Long term current use of oral hypoglycemic drug 10/31/2018   Encounter for long-term (current) use of insulin (HCC) 10/31/2018   Peptic ulcer disease 08/11/2018   Leg pain 07/03/2017   Carpal tunnel syndrome on both sides 05/05/2017   Myalgia due to HMG CoA reductase inhibitor 05/05/2017   History of CVA (cerebrovascular accident) 02/15/2017   Degenerative disc disease, lumbar 12/21/2016   Atherosclerosis of native arteries of extremity with intermittent claudication (HCC) 12/20/2016   Bilateral carotid artery stenosis 12/20/2016   Occlusion and stenosis of bilateral carotid arteries 12/20/2016   Hip bursitis 05/18/2016   Elevated TSH 01/18/2016   CKD stage 3 due to type 2 diabetes mellitus (HCC) 01/16/2016   Senile ecchymosis 01/16/2016   Type II diabetes mellitus with complication (HCC)    GERD (gastroesophageal reflux disease) 07/24/2015   PVD (peripheral vascular disease) (HCC) 05/17/2015   Neoplasm of uncertain behavior of skin 05/17/2015   Retinopathy, diabetic, proliferative (HCC) 04/11/2015   Hyperlipidemia associated with type 2 diabetes mellitus (HCC) 04/11/2015   Essential hypertension 04/11/2015   Generalized OA 04/11/2015   Proliferative diabetic retinopathy(362.02) 04/11/2015   Arteriosclerosis of coronary artery 05/29/2013   Hypertensive heart disease without CHF 05/29/2013    Orientation RESPIRATION BLADDER Height & Weight     Self, Time, Situation, Place  Normal Continent,  External catheter Weight: 136 lb 11 oz (62 kg) Height:  5' (152.4 cm)  BEHAVIORAL SYMPTOMS/MOOD NEUROLOGICAL BOWEL NUTRITION  STATUS      Continent Diet (see discharge summary)  AMBULATORY STATUS COMMUNICATION OF NEEDS Skin   Limited Assist Verbally Normal                       Personal Care Assistance Level of Assistance  Bathing, Feeding, Dressing, Total care Bathing Assistance: Limited assistance Feeding assistance: Independent Dressing Assistance: Limited assistance Total Care Assistance: Limited assistance   Functional Limitations Info  Sight, Hearing, Speech Sight Info: Adequate Hearing Info: Adequate Speech Info: Adequate    SPECIAL CARE FACTORS FREQUENCY  PT (By licensed PT), OT (By licensed OT)     PT Frequency: min 4x weekly OT Frequency: min 4x weekly            Contractures Contractures Info: Not present    Additional Factors Info  Code Status, Allergies Code Status Info: full Allergies Info: Cefdinir  Saxagliptin  Epinephrine  Evolocumab  Atorvastatin  Codeine  Ezetimibe  Limonene  Nitrofurantoin  Sulfa Antibiotics           Current Medications (03/31/2023):  This is the current hospital active medication list Current Facility-Administered Medications  Medication Dose Route Frequency Provider Last Rate Last Admin   acetaminophen (TYLENOL) tablet 650 mg  650 mg Oral Q6H PRN Lorretta Harp, MD   650 mg at 03/30/23 1029   alum & mag hydroxide-simeth (MAALOX/MYLANTA) 200-200-20 MG/5ML suspension 30 mL  30 mL Oral Q4H PRN Kathrynn Running, MD   30 mL at 03/29/23 0147   arformoterol (BROVANA) nebulizer solution 15 mcg  15 mcg Nebulization BID Dimple Nanas, MD   15 mcg at 03/31/23 1610   aspirin EC tablet 81 mg  81 mg Oral Daily Bensimhon, Bevelyn Buckles, MD   81 mg at 03/30/23 1751   Chlorhexidine Gluconate Cloth 2 % PADS 6 each  6 each Topical Daily Wouk, Wilfred Curtis, MD       cyanocobalamin (VITAMIN B12) tablet 1,000 mcg  1,000 mcg Oral Daily Lorretta Harp, MD   1,000 mcg at 03/30/23 1025   dextromethorphan-guaiFENesin (MUCINEX DM) 30-600 MG per 12 hr tablet 1 tablet  1 tablet Oral  BID PRN Lorretta Harp, MD       feeding supplement (GLUCERNA SHAKE) (GLUCERNA SHAKE) liquid 237 mL  237 mL Oral TID BM Lorretta Harp, MD   237 mL at 03/30/23 1026   furosemide (LASIX) 200 mg in dextrose 5 % 100 mL (2 mg/mL) infusion  20 mg/hr Intravenous Continuous Bensimhon, Bevelyn Buckles, MD 10 mL/hr at 03/31/23 0600 20 mg/hr at 03/31/23 0600   gabapentin (NEURONTIN) capsule 100 mg  100 mg Oral QHS Lorretta Harp, MD   100 mg at 03/30/23 2148   heparin ADULT infusion 100 units/mL (25000 units/236mL)  750 Units/hr Intravenous Continuous Kathrynn Running, MD 7.5 mL/hr at 03/31/23 0750 750 Units/hr at 03/31/23 0750   hydrALAZINE (APRESOLINE) injection 10 mg  10 mg Intravenous Q4H PRN Amin, Ankit Chirag, MD       insulin aspart (novoLOG) injection 0-15 Units  0-15 Units Subcutaneous TID WC Kathrynn Running, MD   8 Units at 03/31/23 0759   insulin aspart (novoLOG) injection 0-5 Units  0-5 Units Subcutaneous QHS Kathrynn Running, MD   3 Units at 03/30/23 2148   insulin glargine-yfgn (SEMGLEE) injection 15 Units  15 Units Subcutaneous QHS Wouk, Wilfred Curtis, MD  15 Units at 03/30/23 2148   levalbuterol (XOPENEX) nebulizer solution 1.25 mg  1.25 mg Nebulization Q6H PRN Dimple Nanas, MD   1.25 mg at 03/29/23 0744   melatonin tablet 2.5 mg  2.5 mg Oral QHS PRN Lorretta Harp, MD   2.5 mg at 03/29/23 2143   milrinone (PRIMACOR) 20 MG/100 ML (0.2 mg/mL) infusion  0.25 mcg/kg/min Intravenous Continuous Bensimhon, Bevelyn Buckles, MD 4.6 mL/hr at 03/31/23 0600 0.25 mcg/kg/min at 03/31/23 0600   multivitamin (PROSIGHT) tablet 1 tablet  1 tablet Oral BID Lorretta Harp, MD   1 tablet at 03/30/23 2148   norepinephrine (LEVOPHED) 4mg  in (0.016 mg/mL) premix infusion  0-40 mcg/min Intravenous Titrated Bensimhon, Bevelyn Buckles, MD       ondansetron [facility]) injection 4 mg  4 mg Intravenous Q6H PRN Amin, Loura Halt, MD       Oral care mouth rinse  15 mL Mouth Rinse PRN Wouk, Wilfred Curtis, MD       oxyCODONE (Oxy IR/ROXICODONE)  immediate release tablet 5 mg  5 mg Oral Q4H PRN Amin, Loura Halt, MD   5 mg at 03/29/23 0138   phenol (CHLORASEPTIC) mouth spray 1 spray  1 spray Mouth/Throat PRN Wouk, Wilfred Curtis, MD       potassium chloride SA (KLOR-CON M) CR tablet 10 mEq  10 mEq Oral BID Wieting, Richard, MD       potassium chloride SA (KLOR-CON M) CR tablet 40 mEq  40 mEq Oral Once Alford Highland, MD       promethazine (PHENERGAN) 6.25 MG/5ML solution 6.25 mg  6.25 mg Oral QHS Lorretta Harp, MD   6.25 mg at 03/30/23 2147   revefenacin (YUPELRI) nebulizer solution 175 mcg  175 mcg Nebulization Daily Amin, Ankit Chirag, MD   175 mcg at 03/31/23 0811   rosuvastatin (CRESTOR) tablet 10 mg  10 mg Oral Once per day on Mon Wed Fri Sat Lorretta Harp, MD   10 mg at 03/29/23 1136   senna-docusate (Senokot-S) tablet 1 tablet  1 tablet Oral QHS PRN Amin, Ankit Chirag, MD       sodium chloride tablet 1 g  1 g Oral BID WC Lorretta Harp, MD   1 g at 03/31/23 0800   traZODone (DESYREL) tablet 50 mg  50 mg Oral QHS PRN Dimple Nanas, MD         Discharge Medications: Please see discharge summary for a list of discharge medications.  Relevant Imaging Results:  Relevant Lab Results:   Additional Information SSN:[id number]  Darolyn Rua, LCSW

## 2023-03-31 NOTE — Assessment & Plan Note (Addendum)
Patient initially requiring BiPAP secondary to respiratory distress.  Currently on 2 L.

## 2023-04-01 ENCOUNTER — Inpatient Hospital Stay: Payer: Medicare HMO

## 2023-04-01 DIAGNOSIS — N179 Acute kidney failure, unspecified: Secondary | ICD-10-CM | POA: Diagnosis not present

## 2023-04-01 DIAGNOSIS — E785 Hyperlipidemia, unspecified: Secondary | ICD-10-CM | POA: Diagnosis not present

## 2023-04-01 DIAGNOSIS — I5023 Acute on chronic systolic (congestive) heart failure: Secondary | ICD-10-CM

## 2023-04-01 DIAGNOSIS — Z7189 Other specified counseling: Secondary | ICD-10-CM | POA: Diagnosis not present

## 2023-04-01 DIAGNOSIS — I129 Hypertensive chronic kidney disease with stage 1 through stage 4 chronic kidney disease, or unspecified chronic kidney disease: Secondary | ICD-10-CM | POA: Diagnosis not present

## 2023-04-01 DIAGNOSIS — R570 Cardiogenic shock: Secondary | ICD-10-CM | POA: Diagnosis not present

## 2023-04-01 DIAGNOSIS — I214 Non-ST elevation (NSTEMI) myocardial infarction: Secondary | ICD-10-CM | POA: Diagnosis not present

## 2023-04-01 DIAGNOSIS — N1832 Chronic kidney disease, stage 3b: Secondary | ICD-10-CM | POA: Diagnosis not present

## 2023-04-01 DIAGNOSIS — J90 Pleural effusion, not elsewhere classified: Secondary | ICD-10-CM | POA: Diagnosis not present

## 2023-04-01 DIAGNOSIS — I509 Heart failure, unspecified: Secondary | ICD-10-CM | POA: Diagnosis not present

## 2023-04-01 DIAGNOSIS — N281 Cyst of kidney, acquired: Secondary | ICD-10-CM | POA: Diagnosis not present

## 2023-04-01 DIAGNOSIS — J9601 Acute respiratory failure with hypoxia: Secondary | ICD-10-CM

## 2023-04-01 DIAGNOSIS — D508 Other iron deficiency anemias: Secondary | ICD-10-CM | POA: Diagnosis not present

## 2023-04-01 DIAGNOSIS — E876 Hypokalemia: Secondary | ICD-10-CM

## 2023-04-01 DIAGNOSIS — I5021 Acute systolic (congestive) heart failure: Secondary | ICD-10-CM | POA: Diagnosis not present

## 2023-04-01 DIAGNOSIS — N189 Chronic kidney disease, unspecified: Secondary | ICD-10-CM

## 2023-04-01 DIAGNOSIS — E8721 Acute metabolic acidosis: Secondary | ICD-10-CM | POA: Diagnosis not present

## 2023-04-01 DIAGNOSIS — J441 Chronic obstructive pulmonary disease with (acute) exacerbation: Secondary | ICD-10-CM | POA: Diagnosis not present

## 2023-04-01 DIAGNOSIS — E871 Hypo-osmolality and hyponatremia: Secondary | ICD-10-CM | POA: Diagnosis not present

## 2023-04-01 DIAGNOSIS — I5A Non-ischemic myocardial injury (non-traumatic): Secondary | ICD-10-CM | POA: Diagnosis not present

## 2023-04-01 DIAGNOSIS — I480 Paroxysmal atrial fibrillation: Secondary | ICD-10-CM | POA: Diagnosis not present

## 2023-04-01 LAB — BASIC METABOLIC PANEL
Anion gap: 11 (ref 5–15)
BUN: 77 mg/dL — ABNORMAL HIGH (ref 8–23)
CO2: 22 mmol/L (ref 22–32)
Calcium: 8 mg/dL — ABNORMAL LOW (ref 8.9–10.3)
Chloride: 99 mmol/L (ref 98–111)
Creatinine, Ser: 2.16 mg/dL — ABNORMAL HIGH (ref 0.44–1.00)
GFR, Estimated: 22 mL/min — ABNORMAL LOW (ref >60)
Glucose, Bld: 114 mg/dL — ABNORMAL HIGH (ref 70–99)
Potassium: 3.6 mmol/L (ref 3.5–5.1)
Sodium: 132 mmol/L — ABNORMAL LOW (ref 135–145)

## 2023-04-01 LAB — GLUCOSE, CAPILLARY
Glucose-Capillary: 88 mg/dL (ref 70–99)
Glucose-Capillary: 126 mg/dL — ABNORMAL HIGH (ref 70–99)
Glucose-Capillary: 115 mg/dL — ABNORMAL HIGH (ref 70–99)
Glucose-Capillary: 227 mg/dL — ABNORMAL HIGH (ref 70–99)
Glucose-Capillary: 240 mg/dL — ABNORMAL HIGH (ref 70–99)
Glucose-Capillary: 251 mg/dL — ABNORMAL HIGH (ref 70–99)

## 2023-04-01 LAB — CBC
HCT: 25.1 % — ABNORMAL LOW (ref 36.0–46.0)
Hemoglobin: 8.1 g/dL — ABNORMAL LOW (ref 12.0–15.0)
MCH: 28.4 pg (ref 26.0–34.0)
MCHC: 32.3 g/dL (ref 30.0–36.0)
MCV: 88.1 fL (ref 80.0–100.0)
Platelets: 205 10*3/uL (ref 150–400)
RBC: 2.85 MIL/uL — ABNORMAL LOW (ref 3.87–5.11)
RDW: 13.2 % (ref 11.5–15.5)
WBC: 11 10*3/uL — ABNORMAL HIGH (ref 4.0–10.5)
nRBC: 0 % (ref 0.0–0.2)

## 2023-04-01 LAB — COOXEMETRY PANEL
Carboxyhemoglobin: 1.5 % (ref 0.5–1.5)
Methemoglobin: <0.7 % (ref 0.0–1.5)
O2 Saturation: 75.4 %
Total hemoglobin: 8.7 g/dL — ABNORMAL LOW (ref 12.0–16.0)
Total oxygen content: 73.9 %

## 2023-04-01 LAB — FERRITIN: Ferritin: 33 ng/mL (ref 11–307)

## 2023-04-01 LAB — HEPARIN LEVEL (UNFRACTIONATED): Heparin Unfractionated: 0.32 IU/mL (ref 0.30–0.70)

## 2023-04-01 LAB — MAGNESIUM: Magnesium: 2 mg/dL (ref 1.7–2.4)

## 2023-04-01 LAB — TYPE AND SCREEN
ABO/RH(D): A POS
Antibody Screen: NEGATIVE

## 2023-04-01 LAB — BODY FLUID CULTURE W GRAM STAIN
Culture: NO GROWTH
Gram Stain: NONE SEEN

## 2023-04-01 LAB — APTT: aPTT: 99 seconds — ABNORMAL HIGH (ref 24–36)

## 2023-04-01 MED ORDER — POLYETHYLENE GLYCOL 3350 17 G PO PACK
17.0000 g | PACK | Freq: Every day | ORAL | Status: DC
  Administered 2023-04-01 – 2023-04-20 (×16): 17 g via ORAL
  Filled 2023-04-01 (×20): qty 1

## 2023-04-01 MED ORDER — POTASSIUM CHLORIDE CRYS ER 20 MEQ PO TBCR
40.0000 meq | EXTENDED_RELEASE_TABLET | Freq: Once | ORAL | Status: AC
  Administered 2023-04-01: 40 meq via ORAL
  Filled 2023-04-01: qty 2

## 2023-04-01 NOTE — Progress Notes (Signed)
Physical Therapy Treatment Patient Details Name: Jill Hudson MRN: [id number] DOB: 1935-07-21 Today's Date: 04/01/2023   History of Present Illness presented to ER secondary to progressive SOB; admitted for management of A/C CHF exacerbation, AECOPD.  s/p thoracentesis (6/3) with removal of fluid; pending cardiac cath and TEE    PT Comments    Patient received in bed, she is agreeable to PT session. Patient required increased assistance with all mobility this date. Min/mod for bed mobility and min A for transfers and step pivot to recliner. Forward flexed posture. Patient reports her bottom is sore. She is making slow progress with mobility, but cooperative and motivated to improve. She will continue to benefit from skilled PT to improve strength and functional independence.     Recommendations for follow up therapy are one component of a multi-disciplinary discharge planning process, led by the attending physician.  Recommendations may be updated based on patient status, additional functional criteria and insurance authorization.  Follow Up Recommendations  Can patient physically be transported by private vehicle: No    Assistance Recommended at Discharge Frequent or constant Supervision/Assistance  Patient can return home with the following A lot of help with walking and/or transfers;A little help with bathing/dressing/bathroom;Assist for transportation   Equipment Recommendations  Rolling walker (2 wheels)    Recommendations for Other Services       Precautions / Restrictions Precautions Precautions: Fall Restrictions Weight Bearing Restrictions: No     Mobility  Bed Mobility Overal bed mobility: Needs Assistance Bed Mobility: Supine to Sit     Supine to sit: Min assist, Mod assist     General bed mobility comments: patient required min A to elevate trunk and mod A to scoot forward to edge of bed using bed pad    Transfers Overall transfer level: Needs  assistance Equipment used: Rolling walker (2 wheels) Transfers: Sit to/from Stand, Bed to chair/wheelchair/BSC Sit to Stand: Min assist   Step pivot transfers: Min assist       General transfer comment: cues for upright posture in standing. Patient appears somewhat weaker today than yesterday.    Ambulation/Gait                   Stairs             Wheelchair Mobility    Modified Rankin (Stroke Patients Only)       Balance Overall balance assessment: Needs assistance Sitting-balance support: Feet supported Sitting balance-Leahy Scale: Good Sitting balance - Comments: able to sit unsupported   Standing balance support: Bilateral upper extremity supported, During functional activity, Reliant on assistive device for balance Standing balance-Leahy Scale: Poor Standing balance comment: RW needed with external min A for balance and safety                            Cognition Arousal/Alertness: Awake/alert Behavior During Therapy: WFL for tasks assessed/performed Overall Cognitive Status: Within Functional Limits for tasks assessed                                          Exercises Other Exercises Other Exercises: Seated B LE LAQ, marching x 10 reps    General Comments        Pertinent Vitals/Pain Pain Assessment Pain Assessment: Faces Faces Pain Scale: Hurts a little bit Pain Location: bottom Pain Descriptors /  Indicators: Discomfort, Sore Pain Intervention(s): Repositioned    Home Living                          Prior Function            PT Goals (current goals can now be found in the care plan section) Acute Rehab PT Goals Patient Stated Goal: to get stronger and go home PT Goal Formulation: With patient Time For Goal Achievement: 04/12/23 Potential to Achieve Goals: Good Progress towards PT goals: Progressing toward goals    Frequency    Min 3X/week      PT Plan Current plan remains  appropriate    Co-evaluation              AM-PAC PT "6 Clicks" Mobility   Outcome Measure  Help needed turning from your back to your side while in a flat bed without using bedrails?: A Lot Help needed moving from lying on your back to sitting on the side of a flat bed without using bedrails?: A Lot Help needed moving to and from a bed to a chair (including a wheelchair)?: A Little Help needed standing up from a chair using your arms (e.g., wheelchair or bedside chair)?: A Little Help needed to walk in hospital room?: A Lot Help needed climbing 3-5 steps with a railing? : Total 6 Click Score: 13    End of Session Equipment Utilized During Treatment: Oxygen Activity Tolerance: Patient limited by fatigue Patient left: in chair;with call bell/phone within reach Nurse Communication: Mobility status PT Visit Diagnosis: Muscle weakness (generalized) (M62.81);Difficulty in walking, not elsewhere classified (R26.2)     Time: 1610-9604 PT Time Calculation (min) (ACUTE ONLY): 16 min  Charges:  $Therapeutic Activity: 8-22 mins                     Cattaleya Wien, PT, GCS 04/01/23,10:35 AM

## 2023-04-01 NOTE — TOC Progression Note (Signed)
Transition of Care [facility]) - Progression Note    Patient Details  Name: Jill Hudson MRN: [id number] Date of Birth: 1935-05-07  [facility] CM/SW Contact  Darolyn Rua, [place] Phone Number: 04/01/2023, 10:03 AM  Clinical Narrative:     Two bed offers provided to patient's daughter Felipa Eth and [place] [facility]. Hopeful of additional bed offers today to choose from,CSW will keep updated.   Expected Discharge Plan: Skilled Nursing Facility Barriers to Discharge: Continued Medical Work up  Expected Discharge Plan and Services       Living arrangements for the past 2 months: Single Family Home                                       Social Determinants of Health (SDOH) Interventions SDOH Screenings   Food Insecurity: No Food Insecurity (03/25/2023)  Housing: Low Risk  (03/25/2023)  Transportation Needs: No Transportation Needs (03/25/2023)  Utilities: Not At Risk (03/25/2023)  Alcohol Screen: Low Risk  (03/11/2022)  Depression (PHQ2-9): High Risk (01/01/2023)  Financial Resource Strain: Low Risk  (01/01/2023)  Physical Activity: Insufficiently Active (03/11/2022)  Social Connections: Socially Integrated (05/12/2022)  Stress: No Stress Concern Present (03/11/2022)  Tobacco Use: Medium Risk (03/25/2023)    Readmission Risk Interventions     No data to display

## 2023-04-01 NOTE — Progress Notes (Signed)
Daily Progress Note   Patient Name: Jill Hudson       Date: 04/01/2023 DOB: 1934/11/04  Age: 88 y.o. MRN#: [id number] Attending Physician: Alford Highland, MD Primary Care Physician: Reubin Milan, MD Admit Date: 03/25/2023  Reason for Consultation/Follow-up: Establishing goals of care  Subjective: Notes and labs reviewed. In to see patient. She is in good spirits this morning with no complaints. Nursing is at bedside. PMT will follow.    Length of Stay: 7  Current Medications: Scheduled Meds:   arformoterol  15 mcg Nebulization BID   aspirin EC  81 mg Oral Daily   Chlorhexidine Gluconate Cloth  6 each Topical Daily   cyanocobalamin  1,000 mcg Oral Daily   feeding supplement (GLUCERNA SHAKE)  237 mL Oral TID BM   gabapentin  100 mg Oral QHS   insulin aspart  0-15 Units Subcutaneous TID WC   insulin aspart  0-5 Units Subcutaneous QHS   insulin glargine-yfgn  15 Units Subcutaneous QHS   multivitamin  1 tablet Oral BID   polyethylene glycol  17 g Oral Daily   potassium chloride  10 mEq Oral BID   promethazine  6.25 mg Oral QHS   revefenacin  175 mcg Nebulization Daily   rosuvastatin  10 mg Oral Once per day on Mon Wed Fri Sat   torsemide  40 mg Oral BID    Continuous Infusions:  amiodarone 30 mg/hr (04/01/23 1234)   heparin 750 Units/hr (04/01/23 0700)   milrinone 0.125 mcg/kg/min (04/01/23 0700)   norepinephrine (LEVOPHED) Adult infusion      PRN Meds: acetaminophen, alum & mag hydroxide-simeth, dextromethorphan-guaiFENesin, hydrALAZINE, levalbuterol, melatonin, ondansetron (ZOFRAN) IV, mouth rinse, oxyCODONE, phenol, senna-docusate, traZODone  Physical Exam Pulmonary:     Effort: Pulmonary effort is normal.  Neurological:     Mental Status: She is alert.              Vital Signs: BP (!) 125/46   Pulse 64   Temp 97.9 F (36.6 C) (Oral)   Resp 18   Ht 5' (1.524 m)   Wt 59.7 kg   SpO2 97%   BMI 25.70 kg/m  SpO2: SpO2: 97 % O2 Device: O2 Device: Nasal Cannula O2 Flow Rate: O2 Flow Rate (L/min): 1 L/min  Intake/output summary:  Intake/Output Summary (Last 24 hours)  at 04/01/2023 1301 Last data filed at 04/01/2023 1118 Gross per 24 hour  Intake 2531.67 ml  Output 1100 ml  Net 1431.67 ml   LBM:   Baseline Weight: Weight: 56.2 kg Most recent weight: Weight: 59.7 kg    Patient Active Problem List   Diagnosis Date Noted   Acute on chronic systolic CHF (congestive heart failure) (HCC) 03/31/2023   Paroxysmal atrial fibrillation (HCC) 03/31/2023   Hypokalemia 03/31/2023   Cardiogenic shock (HCC) 03/28/2023   COPD exacerbation (HCC) 03/25/2023   HLD (hyperlipidemia) 03/25/2023   Type II diabetes mellitus with renal manifestations (HCC) 03/25/2023   CAD (coronary artery disease) 03/25/2023   Myocardial injury 03/25/2023   Iron deficiency anemia 03/25/2023   PAD (peripheral artery disease) (HCC) 03/25/2023   Acquired thrombophilia (HCC) 03/20/2021   Mood disorder (HCC) 05/02/2020   Chronic heart failure with preserved ejection fraction (HCC) 04/12/2020   Diverticulosis of large intestine without perforation or abscess with bleeding 03/27/2020   COPD (chronic obstructive pulmonary disease) with chronic bronchitis    Acute respiratory failure with hypoxia (HCC) 02/18/2020   CKD (chronic kidney disease), stage IIIa 02/18/2020   Hyponatremia 02/18/2020   Malnutrition of mild degree (HCC) 01/08/2020   Acute kidney injury superimposed on CKD (HCC)    Age-related macular degeneration, dry, left eye 06/21/2019   Age-related macular degeneration, wet, right eye (HCC) 06/21/2019   Moderate nonproliferative diabetic retinopathy associated with type 2 diabetes mellitus (HCC) 06/21/2019   Lumbosacral radiculopathy at L4 05/01/2019   Underweight  05/01/2019   Encounter for long-term (current) use of aspirin 10/31/2018   Long term current use of oral hypoglycemic drug 10/31/2018   Encounter for long-term (current) use of insulin (HCC) 10/31/2018   Peptic ulcer disease 08/11/2018   Leg pain 07/03/2017   Carpal tunnel syndrome on both sides 05/05/2017   Myalgia due to HMG CoA reductase inhibitor 05/05/2017   History of CVA (cerebrovascular accident) 02/15/2017   Degenerative disc disease, lumbar 12/21/2016   Atherosclerosis of native arteries of extremity with intermittent claudication (HCC) 12/20/2016   Bilateral carotid artery stenosis 12/20/2016   Occlusion and stenosis of bilateral carotid arteries 12/20/2016   Hip bursitis 05/18/2016   Elevated TSH 01/18/2016   CKD stage 3 due to type 2 diabetes mellitus (HCC) 01/16/2016   Senile ecchymosis 01/16/2016   Type II diabetes mellitus with complication (HCC)    GERD (gastroesophageal reflux disease) 07/24/2015   PVD (peripheral vascular disease) (HCC) 05/17/2015   Neoplasm of uncertain behavior of skin 05/17/2015   Retinopathy, diabetic, proliferative (HCC) 04/11/2015   Hyperlipidemia associated with type 2 diabetes mellitus (HCC) 04/11/2015   Essential hypertension 04/11/2015   Generalized OA 04/11/2015   Proliferative diabetic retinopathy(362.02) 04/11/2015   Arteriosclerosis of coronary artery 05/29/2013   Hypertensive heart disease without CHF 05/29/2013    Palliative Care Assessment & Plan    Recommendations/Plan:  PMT will follow.    Code Status:    Code Status Orders  (From admission, onward)           Start     Ordered   03/31/23 1502  Do not attempt resuscitation (DNR)  Continuous       Question Answer Comment  If patient has no pulse and is not breathing Do Not Attempt Resuscitation   If patient has a pulse and/or is breathing: Medical Treatment Goals LIMITED ADDITIONAL INTERVENTIONS: Use medication/IV fluids and cardiac monitoring as indicated; Do  not use intubation or mechanical ventilation (DNI), also provide comfort medications.  Transfer to Progressive/Stepdown as indicated, avoid Intensive Care.   Consent: Discussion documented in EHR or advanced directives reviewed      03/31/23 1501           Code Status History     Date Active Date Inactive Code Status Order ID Comments User Context   03/25/2023 1214 03/31/2023 1501 Full Code [id number]  Lorretta Harp, MD ED   02/18/2020 1059 02/26/2020 2310 Full Code [id number]  Lorretta Harp, MD ED   12/25/2019 0555 12/28/2019 2204 Full Code [id number]  Mansy, Vernetta Honey, MD ED   10/12/2018 1647 10/15/2018 1808 Full Code [id number]  Schnier, Latina Craver, MD Inpatient   09/16/2018 1254 09/16/2018 2048 Full Code [id number]  Gilda Crease, Latina Craver, MD Inpatient   08/11/2018 1706 08/13/2018 2136 Full Code [id number]  Bertrum Sol, MD Inpatient   07/26/2018 0921 07/26/2018 1652 Full Code [id number]  Renford Dills, MD Inpatient   08/23/2017 0935 08/23/2017 1419 Full Code [id number]  Annice Needy, MD Inpatient   02/15/2017 1538 02/16/2017 1959 Full Code [id number]  Auburn Bilberry, MD Inpatient       Thank you for allowing the Palliative Medicine Team to assist in the care of this patient.    Morton Stall, NP  Please contact Palliative Medicine Team phone at [phone] for questions and concerns.

## 2023-04-01 NOTE — Progress Notes (Signed)
   04/01/23 1400  Spiritual Encounters  Type of Visit Initial  Care provided to: Pt and family  Conversation partners present during encounter Other (comment)  Referral source Nurse (RN/NT/LPN)  Reason for visit Routine spiritual support  OnCall Visit No  Spiritual Framework  Presenting Themes Meaning/purpose/sources of inspiration;Values and beliefs;Courage hope and growth  Community/Connection Family;Friend(s);Faith community  Patient Stress Factors None identified  Family Stress Factors None identified  Interventions  Spiritual Care Interventions Made Established relationship of care and support;Reflective listening;Compassionate presence;Explored values/beliefs/practices/strengths;Encouragement  Intervention Outcomes  Outcomes Connection to spiritual care;Awareness around self/spiritual resourses  Spiritual Care Plan  Spiritual Care Issues Still Outstanding No further spiritual care needs at this time (see row info)   Nurse suggested that Chaplain visit with patient and family. Chaplain met with patient and her daughter and son in law. Patient told chaplain that her faith is what has been keeping her strong. Chaplain shared words of hope and encouragement with the patient.

## 2023-04-01 NOTE — Assessment & Plan Note (Signed)
On Crestor 

## 2023-04-01 NOTE — Assessment & Plan Note (Addendum)
Patient on Eliquis for stroke prevention

## 2023-04-01 NOTE — Progress Notes (Signed)
Advanced Heart Failure Team Progress Note   Primary Physician: Reubin Milan, MD PCP-Cardiologist:  None  Reason for Consultation: Acute systolic HF  Interval history   Remains on milrinone 0.125. IV lasix stopped.  Scr 2.75 -> 2.42 -> 2.16  Remains in AF. (Rate controlled). Remains on IV amio and heparin   Feeling better. Denies CP or SOB.   Objective:    Vital Signs:   Temp:  [97.9 F (36.6 C)-98.4 F (36.9 C)] 97.9 F (36.6 C) (06/06 0722) Pulse Rate:  [60-108] 61 (06/06 0700) Resp:  [12-23] 14 (06/06 0700) BP: (90-130)/(38-96) 113/57 (06/06 0600) SpO2:  [95 %-100 %] 97 % (06/06 0700) Weight:  [59.7 kg] 59.7 kg (06/06 0152)    Weight change: Filed Weights   03/30/23 0427 03/31/23 0410 04/01/23 0152  Weight: 61.1 kg 62 kg 59.7 kg    Intake/Output:   Intake/Output Summary (Last 24 hours) at 04/01/2023 0825 Last data filed at 04/01/2023 0700 Gross per 24 hour  Intake 2661.67 ml  Output 1575 ml  Net 1086.67 ml     Physical Exam    General:  Sitting up No resp difficulty HEENT: normal Neck: supple. LIJ TLC Carotids 2+ bilat; no bruits. No lymphadenopathy or thryomegaly appreciated. Cor: PMI nondisplaced. Irregular rate & rhythm. No rubs, gallops or murmurs. Lungs: clear Abdomen: soft, nontender, nondistended. No hepatosplenomegaly. No bruits or masses. Good bowel sounds. Extremities: no cyanosis, clubbing, rash, edema Neuro: alert & orientedx3, cranial nerves grossly intact. moves all 4 extremities w/o difficulty. Affect pleasant  Telemetry   AF 80-90s Personally reviewed  Labs   Basic Metabolic Panel: Recent Labs  Lab 03/27/23 0437 03/28/23 0531 03/29/23 0412 03/30/23 0345 03/30/23 1423 03/31/23 0446 03/31/23 1446 04/01/23 0503  NA 126* 125*   < > 127* 126* 130* 131* 132*  K 4.4 3.9   < > 3.6 3.6 3.0* 3.8 3.6  CL 97* 94*   < > 96* 93* 98 97* 99  CO2 15* 18*   < > 21* 22 22 23 22   GLUCOSE 151* 218*   < > 224* 298* 125* 286* 114*   BUN 60* 76*   < > 92* 90* 79* 84* 77*  CREATININE 2.59* 2.84*   < > 2.69* 2.75* 2.42* 2.22* 2.16*  CALCIUM 8.4* 7.9*   < > 7.5* 7.5* 7.9* 8.1* 8.0*  MG 2.1 2.1  --  2.2  --  2.2  --  2.0  PHOS  --  5.9*  --   --   --   --   --   --    < > = values in this interval not displayed.     Liver Function Tests: Recent Labs  Lab 03/25/23 1001 03/29/23 1612  AST 22  --   ALT 16  --   ALKPHOS 73  --   BILITOT 0.6  --   PROT 6.2* 5.3*  ALBUMIN 3.5  --     No results for input(s): "LIPASE", "AMYLASE" in the last 168 hours. No results for input(s): "AMMONIA" in the last 168 hours.  CBC: Recent Labs  Lab 03/28/23 0531 03/29/23 0412 03/29/23 2315 03/31/23 0446 04/01/23 0503  WBC 15.2* 15.6* 11.9* 10.2 11.0*  HGB 9.4* 9.7* 9.3* 8.5* 8.1*  HCT 28.8* 29.4* 27.8* 25.3* 25.1*  MCV 87.0 86.2 86.6 86.9 88.1  PLT 225 242 228 196 205     Cardiac Enzymes: No results for input(s): "CKTOTAL", "CKMB", "CKMBINDEX", "TROPONINI" in the last 168 hours.  BNP: BNP (last 3 results) Recent Labs    03/25/23 1001  BNP 971.3*     ProBNP (last 3 results) No results for input(s): "PROBNP" in the last 8760 hours.   CBG: Recent Labs  Lab 03/31/23 1611 03/31/23 2110 04/01/23 0133 04/01/23 0600 04/01/23 0728  GLUCAP 392* 169* 88 126* 115*     Coagulation Studies: No results for input(s): "LABPROT", "INR" in the last 72 hours.   Imaging   No results found.   Medications:     Current Medications:  arformoterol  15 mcg Nebulization BID   aspirin EC  81 mg Oral Daily   Chlorhexidine Gluconate Cloth  6 each Topical Daily   cyanocobalamin  1,000 mcg Oral Daily   feeding supplement (GLUCERNA SHAKE)  237 mL Oral TID BM   gabapentin  100 mg Oral QHS   insulin aspart  0-15 Units Subcutaneous TID WC   insulin aspart  0-5 Units Subcutaneous QHS   insulin glargine-yfgn  15 Units Subcutaneous QHS   multivitamin  1 tablet Oral BID   potassium chloride  10 mEq Oral BID    promethazine  6.25 mg Oral QHS   revefenacin  175 mcg Nebulization Daily   rosuvastatin  10 mg Oral Once per day on Mon Wed Fri Sat   torsemide  40 mg Oral BID    Infusions:  amiodarone 30 mg/hr (04/01/23 0700)   heparin 750 Units/hr (04/01/23 0700)   milrinone 0.125 mcg/kg/min (04/01/23 0700)   norepinephrine (LEVOPHED) Adult infusion        Assessment/Plan   1. Acute systolic HF -> cardiogenic shock - Echo 2023 EF 55-60% - Echo this admit read as EF 30-35%. RV ok  -> I reviewed. EF 35-40% there is severe HK of basaliar to mid lateral wall and inferolateral wall - High-sensitivity troponin 225 -> 458 - >1186 -> 1165.  BNP 971  - Suspect this is an ischemic CM  - Developed low output HF and elevated volume status. Initial co-ox 44% CVP 21 (03/29/23) - Now improving with milrinone and IV lasix - CVP 8-9 co-ox 75% - Continue milrinone 0.125 to support renal function. Now on po torsemide - Eventually will need coronary if renal function improves. Possible cath Friday or Monday depending on renal function  2. NSTEMI - echo as above with RWMA - no longer having CP - continue ASA/statin/heparin for now  3. AKI on CKD 3a - likely ATN/cardiorenal  - Scr 1.5 -> 2.8 -> 2.7 -> 2.4 -> 2.1 - Continue inotropic support for acute HF and follow renal function - renal u/s 3/21 with medico-renal disease. May be worth repeating - renal following  4. Acute hypoxic respiratory failure - likely due primarily to HF and pleural effusions on top of underlying COPD - s/p R thora on 6/3 with 700cc out  - Repeat CXR in am to reassess  5. PAF  - back in AF (was in NSR on admit) - Continue amio IV - Continue heparin   6. Hypervolemic hyponatremia - restrict FW, treat HF - improving  7. DNR/DNI/no dialysis - confirmed with patient and her daughter  D/w daughter at bedside   Length of Stay: 7  Jill Meres, MD  04/01/2023, 8:25 AM  Advanced Heart Failure Team Pager [phone] (M-F; 7a  - 5p)  Please contact [facility] for night-coverage after hours (4p -7a ) and weekends on [email or web address]

## 2023-04-01 NOTE — Progress Notes (Signed)
[facility]  ROUNDING NOTE   Subjective:   [name] was admitted to [facility] on 03/25/2023 for COPD exacerbation [facility]) [J44.1] Chest pain [R07.9] Community acquired pneumonia, unspecified laterality [J18.9]  milrinone and furosemide gtt.   UOP .   Creatinine 2.16 (2.42) (2.75)  Objective:  Vital signs in last 24 hours:  Temp:  [97.9 F (36.6 C)-98.4 F (36.9 C)] 97.9 F (36.6 C) (06/06 0722) Pulse Rate:  [59-104] 64 (06/06 1200) Resp:  [12-23] 18 (06/06 1200) BP: (90-131)/(38-100) 125/46 (06/06 1200) SpO2:  [90 %-100 %] 97 % (06/06 1200) Weight:  [59.7 kg] 59.7 kg (06/06 0152)  Weight change: -2.3 kg Filed Weights   03/30/23 0427 03/31/23 0410 04/01/23 0152  Weight: 61.1 kg 62 kg 59.7 kg    Intake/Output: I/O last 3 completed shifts: In: 2820.4 [P.O.:1870; I.V.:950.4] Out: 3155 [Urine:3155]   Intake/Output this shift:  Total I/O In: 480 [P.O.:480] Out: -   Physical Exam: General: NAD, sitting in chair  Head: Normocephalic, atraumatic. Moist oral mucosal membranes  Eyes: Anicteric  Lungs:  Basilar crackles L>R  Heart: irregular  Abdomen:  Soft, nontender,   Extremities:  no peripheral edema.  Neurologic: Nonfocal, moving all four extremities  Skin: No lesions        Basic Metabolic Panel: Recent Labs  Lab 03/27/23 0437 03/28/23 0531 03/29/23 0412 03/30/23 0345 03/30/23 1423 03/31/23 0446 03/31/23 1446 04/01/23 0503  NA 126* 125*   < > 127* 126* 130* 131* 132*  K 4.4 3.9   < > 3.6 3.6 3.0* 3.8 3.6  CL 97* 94*   < > 96* 93* 98 97* 99  CO2 15* 18*   < > 21* 22 22 23 22   GLUCOSE 151* 218*   < > 224* 298* 125* 286* 114*  BUN 60* 76*   < > 92* 90* 79* 84* 77*  CREATININE 2.59* 2.84*   < > 2.69* 2.75* 2.42* 2.22* 2.16*  CALCIUM 8.4* 7.9*   < > 7.5* 7.5* 7.9* 8.1* 8.0*  MG 2.1 2.1  --  2.2  --  2.2  --  2.0  PHOS  --  5.9*  --   --   --   --   --   --    < > = values in this interval not displayed.     Liver Function  Tests: Recent Labs  Lab 03/29/23 1612  PROT 5.3*    No results for input(s): "LIPASE", "AMYLASE" in the last 168 hours. No results for input(s): "AMMONIA" in the last 168 hours.  CBC: Recent Labs  Lab 03/28/23 0531 03/29/23 0412 03/29/23 2315 03/31/23 0446 04/01/23 0503  WBC 15.2* 15.6* 11.9* 10.2 11.0*  HGB 9.4* 9.7* 9.3* 8.5* 8.1*  HCT 28.8* 29.4* 27.8* 25.3* 25.1*  MCV 87.0 86.2 86.6 86.9 88.1  PLT 225 242 228 196 205     Cardiac Enzymes: No results for input(s): "CKTOTAL", "CKMB", "CKMBINDEX", "TROPONINI" in the last 168 hours.  BNP: Invalid input(s): "POCBNP"  CBG: Recent Labs  Lab 03/31/23 2110 04/01/23 0133 04/01/23 0600 04/01/23 0728 04/01/23 1204  GLUCAP 169* 88 126* 115* 227*     Microbiology: Results for orders placed or performed during the hospital encounter of 03/25/23  Blood Culture (routine x 2)     Status: None   Collection Time: 03/25/23 12:09 PM   Specimen: BLOOD  Result Value Ref Range Status   Specimen Description BLOOD LEFT [facility]  Final   Special Requests   Final  BOTTLES DRAWN AEROBIC AND ANAEROBIC Blood Culture adequate volume   Culture   Final    NO GROWTH 5 DAYS Performed at [facility], [address] [place] [place], [place] [address]    Report Status 03/30/2023 FINAL  Final  Blood Culture (routine x 2)     Status: None   Collection Time: 03/25/23 12:10 PM   Specimen: BLOOD  Result Value Ref Range Status   Specimen Description BLOOD LEFT FA  Final   Special Requests   Final    BOTTLES DRAWN AEROBIC AND ANAEROBIC Blood Culture adequate volume   Culture   Final    NO GROWTH 5 DAYS Performed at [facility], [address], [place], [place] [address]    Report Status 03/30/2023 FINAL  Final  SARS Coronavirus 2 by RT PCR (hospital order, performed in [facility] hospital lab) *cepheid single result test* Anterior Nasal Swab     Status: None   Collection Time: 03/25/23  1:18 PM   Specimen: Anterior Nasal Swab   Result Value Ref Range Status   SARS Coronavirus 2 by RT PCR NEGATIVE NEGATIVE Final    Comment: (NOTE) SARS-CoV-2 target nucleic acids are NOT DETECTED.  The SARS-CoV-2 RNA is generally detectable in upper and lower respiratory specimens during the acute phase of infection. The lowest concentration of SARS-CoV-2 viral copies this assay can detect is 250 copies / mL. A negative result does not preclude SARS-CoV-2 infection and should not be used as the sole basis for treatment or other patient management decisions.  A negative result may occur with improper specimen collection / handling, submission of specimen other than nasopharyngeal swab, presence of viral mutation(s) within the areas targeted by this assay, and inadequate number of viral copies (<250 copies / mL). A negative result must be combined with clinical observations, patient history, and epidemiological information.  Fact Sheet for Patients:   RoadLapTop.co.za  Fact Sheet for Healthcare Providers: http://kim-miller.com/  This test is not yet approved or  cleared by the [place] FDA and has been authorized for detection and/or diagnosis of SARS-CoV-2 by FDA under an Emergency Use Authorization (EUA).  This EUA will remain in effect (meaning this test can be used) for the duration of the COVID-19 declaration under Section 564(b)(1) of the Act, 21 U.S.C. section 360bbb-3(b)(1), unless the authorization is terminated or revoked sooner.  Performed at [facility], [address] [place], [place] [address]   MRSA Next Gen by PCR, Nasal     Status: None   Collection Time: 03/27/23  4:40 AM   Specimen: Nasal Mucosa; Nasal Swab  Result Value Ref Range Status   MRSA by PCR Next Gen NOT DETECTED NOT DETECTED Final    Comment: (NOTE) The GeneXpert MRSA Assay (FDA approved for NASAL specimens only), is one component of a comprehensive MRSA colonization  surveillance program. It is not intended to diagnose MRSA infection nor to guide or monitor treatment for MRSA infections. Test performance is not FDA approved in patients less than 60 years old. Performed at [facility], [address] [place], [place] [address]   Respiratory (~20 pathogens) panel by PCR     Status: None   Collection Time: 03/28/23  4:00 PM   Specimen: Nasopharyngeal Swab; Respiratory  Result Value Ref Range Status   Adenovirus NOT DETECTED NOT DETECTED Final   Coronavirus 229E NOT DETECTED NOT DETECTED Final    Comment: (NOTE) The Coronavirus on the Respiratory Panel, DOES NOT test for the  novel  Coronavirus (2019 nCoV)    Coronavirus HKU1 NOT DETECTED NOT DETECTED Final   Coronavirus NL63 NOT DETECTED NOT DETECTED Final   Coronavirus OC43 NOT DETECTED NOT DETECTED Final   Metapneumovirus NOT DETECTED NOT DETECTED Final   Rhinovirus / Enterovirus NOT DETECTED NOT DETECTED Final   Influenza A NOT DETECTED NOT DETECTED Final   Influenza B NOT DETECTED NOT DETECTED Final   Parainfluenza Virus 1 NOT DETECTED NOT DETECTED Final   Parainfluenza Virus 2 NOT DETECTED NOT DETECTED Final   Parainfluenza Virus 3 NOT DETECTED NOT DETECTED Final   Parainfluenza Virus 4 NOT DETECTED NOT DETECTED Final   Respiratory Syncytial Virus NOT DETECTED NOT DETECTED Final   Bordetella pertussis NOT DETECTED NOT DETECTED Final   Bordetella Parapertussis NOT DETECTED NOT DETECTED Final   Chlamydophila pneumoniae NOT DETECTED NOT DETECTED Final   Mycoplasma pneumoniae NOT DETECTED NOT DETECTED Final    Comment: Performed at [facility], [address], [place], [place] [address]  Body fluid culture w Gram Stain     Status: None   Collection Time: 03/29/23 10:27 AM   Specimen: PATH Cytology Pleural fluid  Result Value Ref Range Status   Specimen Description   Final    PLEURAL Performed at [facility], [address] [place], [place] [address]    Special  Requests   Final    NONE Performed at [facility], [address] [place], [place] [address]    Gram Stain NO WBC SEEN NO ORGANISMS SEEN   Final   Culture   Final    NO GROWTH 3 DAYS Performed at [facility], [address] [place], [place] [address]    Report Status 04/01/2023 FINAL  Final  MRSA Next Gen by PCR, Nasal     Status: None   Collection Time: 03/29/23  3:00 PM   Specimen: Nasal Mucosa; Nasal Swab  Result Value Ref Range Status   MRSA by PCR Next Gen NOT DETECTED NOT DETECTED Final    Comment: (NOTE) The GeneXpert MRSA Assay (FDA approved for NASAL specimens only), is one component of a comprehensive MRSA colonization surveillance program. It is not intended to diagnose MRSA infection nor to guide or monitor treatment for MRSA infections. Test performance is not FDA approved in patients less than 31 years old. Performed at [facility], [address] [place], [place] [address]   Group A Strep by PCR     Status: None   Collection Time: 03/30/23  6:00 PM   Specimen: Throat; Sterile Swab  Result Value Ref Range Status   Group A Strep by PCR NOT DETECTED NOT DETECTED Final    Comment: Performed at [facility], [address] [place], [place] [address]    Coagulation Studies: No results for input(s): "LABPROT", "INR" in the last 72 hours.   Urinalysis: No results for input(s): "COLORURINE", "LABSPEC", "PHURINE", "GLUCOSEU", "HGBUR", "BILIRUBINUR", "KETONESUR", "PROTEINUR", "UROBILINOGEN", "NITRITE", "LEUKOCYTESUR" in the last 72 hours.  Invalid input(s): "APPERANCEUR"    Imaging: DG Chest Port 1 View  Result Date: 04/01/2023 CLINICAL DATA:  Congestive heart failure. EXAM: PORTABLE CHEST 1 VIEW COMPARISON:  Radiograph 03/29/2023, CT 03/28/2023 FINDINGS: Left central line is unchanged in position. Cardiomegaly is stable. Unchanged mediastinal contours with aortic atherosclerosis. Stable left pleural effusion and basilar  opacity. Increasing right pleural effusion. Fluid in the right minor fissure. Mild vascular congestion. No pneumothorax. IMPRESSION: 1. Increasing right pleural effusion. 2. Stable left pleural effusion  and basilar opacity. 3. Stable cardiomegaly.  Mild vascular congestion. Electronically Signed   By: Narda Rutherford M.D.   On: 04/01/2023 09:02     Medications:    amiodarone 30 mg/hr (04/01/23 1234)   heparin 750 Units/hr (04/01/23 0700)   milrinone 0.125 mcg/kg/min (04/01/23 0700)   norepinephrine (LEVOPHED) Adult infusion      arformoterol  15 mcg Nebulization BID   aspirin EC  81 mg Oral Daily   Chlorhexidine Gluconate Cloth  6 each Topical Daily   cyanocobalamin  1,000 mcg Oral Daily   feeding supplement (GLUCERNA SHAKE)  237 mL Oral TID BM   gabapentin  100 mg Oral QHS   insulin aspart  0-15 Units Subcutaneous TID WC   insulin aspart  0-5 Units Subcutaneous QHS   insulin glargine-yfgn  15 Units Subcutaneous QHS   multivitamin  1 tablet Oral BID   polyethylene glycol  17 g Oral Daily   potassium chloride  10 mEq Oral BID   promethazine  6.25 mg Oral QHS   revefenacin  175 mcg Nebulization Daily   rosuvastatin  10 mg Oral Once per day on Mon Wed Fri Sat   torsemide  40 mg Oral BID   acetaminophen, alum & mag hydroxide-simeth, dextromethorphan-guaiFENesin, hydrALAZINE, levalbuterol, melatonin, ondansetron (ZOFRAN) IV, mouth rinse, oxyCODONE, phenol, senna-docusate, traZODone  Assessment/ Plan:  [name] is a 88 y.o.  female with diabetes mellitus type II, hypertension, coronary artery disease, peripheral arterial disease, tremor, CVA, congestive heart failure and anemia who is admitted to [facility] on 03/25/2023 for COPD exacerbation (HCC) [J44.1] Chest pain [R07.9] Community acquired pneumonia, unspecified laterality [J18.9]  Acute kidney injury with acute metabolic acidosis on chronic kidney disease stage IIIB: baseline creatinine of 1.48, GFR of 34 on 12/09/2022. Acute  kidney injury secondary to acute cardiorenal syndrome . Chronic kidney disease secondary to hypertension.  - Check renal ultrasound  Hypertension with chronic kidney disease: with chronic systolic and diastolic congestive heart failure.  Appreciate cardiology input.  Furosemide gtt  Hyponatremia: -Sodium slowly improving, 132   LOS: 7 Fredy Gladu 6/6/20241:01 PM

## 2023-04-01 NOTE — Progress Notes (Signed)
ANTICOAGULATION CONSULT NOTE  Pharmacy Consult for heparin infusion Indication: ACS/STEMI/ apixaban for Afib PTA  Allergies  Allergen Reactions   Cefdinir Diarrhea   Saxagliptin Diarrhea   Epinephrine Other (See Comments)    Patient does not remember what happens when she uses this   Evolocumab     Pain with injection   Atorvastatin Other (See Comments)    Muscle aches   Codeine Other (See Comments)    Upset stomach   Ezetimibe Other (See Comments)    Myalgias(ZETIA)   Limonene Rash    Patient does not recall this reaction   Nitrofurantoin Rash and Other (See Comments)    Pruitus   Sulfa Antibiotics Rash and Other (See Comments)    Sore mouth     Patient Measurements: Height: 5' (152.4 cm) Weight: 59.7 kg (131 lb 9.8 oz) IBW/kg (Calculated) : 45.5 Heparin Dosing Weight: 56.2 kg  Vital Signs: Temp: 97.9 F (36.6 C) (06/05 1930) Temp Source: Oral (06/06 0400) BP: 113/57 (06/06 0600) Pulse Rate: 81 (06/06 0600)  Labs: Recent Labs    03/29/23 2315 03/29/23 2317 03/30/23 0345 03/30/23 1423 03/30/23 2315 03/31/23 0446 03/31/23 1446 03/31/23 2134 04/01/23 0503  HGB 9.3*  --   --   --   --  8.5*  --   --  8.1*  HCT 27.8*  --   --   --   --  25.3*  --   --  25.1*  PLT 228  --   --   --   --  196  --   --  205  APTT 176*  --  197* 94*   < > 111* 80* 72* 99*  HEPARINUNFRC  --    < > 0.98*  --   --  0.53  --   --  0.32  CREATININE 2.79*  --  2.69* 2.75*  --  2.42* 2.22*  --   --    < > = values in this interval not displayed.     Estimated Creatinine Clearance: 14.7 mL/min (A) (by C-G formula based on SCr of 2.22 mg/dL (H)).   Medical History: Past Medical History:  Diagnosis Date   Acute CHF (congestive heart failure) (HCC) 02/18/2020   Acute diastolic CHF (congestive heart failure) (HCC)    Acute kidney injury superimposed on CKD (HCC)    Allergies    Anxiety    Arthritis    spine and shoulder   Atherosclerosis of artery of extremity with rest pain  (HCC) 10/12/2018   Cancer (HCC)    skin   CHF (congestive heart failure) (HCC)    Diabetes mellitus without complication (HCC)    Diverticulosis of large intestine with hemorrhage    GI bleeding 12/25/2019   Heart murmur    Hyperlipidemia    Hypertension    Macula lutea degeneration    Mitral and aortic valve disease    Myocardial infarction (HCC)    may have had a "light" heart attack   Non-ST elevation (NSTEMI) myocardial infarction (HCC)    Occasional tremors    PAD (peripheral artery disease) (HCC)    Rectal bleeding    Shingles    patient unaware but daughter confirms. it was a long time ago   Stroke [facility]) 01/2017   may have had a slight stroke   TIA (transient ischemic attack) 01/2017   UTI (urinary tract infection)    Vascular disease, peripheral (HCC)     Medications:  PTA Meds: Apixaban 2.5 mg BID,  last dose 5/30 @ 1856  Assessment: Pt is a 88 yo female initially presenting to ED w/ COPD exacerbation, now c/o increase WOB , found with increasing Troponin I levels.  5/31 1428 aPTT=95 Therapeutic x1 5/31 2228 aPTT=120, supratherapeutic 6/1   0802 aPTT=68/HL>1.10   aPTT therapeuticx1 6/1   1358 aPTT= 89  Therapeuticx2 6/2   0531 aPTT = 82, therapeutic x 3 / HL > 1.1, not correlating 6/3   0412 aPTT = 58, subtherapeutic / HL 0.68, not correlating 6/3   1612 aPTT = 40, subtherapeutic 6/3   2315 aPTT = 176,  HL = 0.94 - possible sampling error 6/4   0345 aPTT = 197,  HL = 0.98 - elevated  6/4   1423 aPTT = 94 6/4   2315 aPTT = 77, therapeutic X 2  6/5   0446 aPTT = 111 (elevated), HL = 0.53 6/5   1446 aPTT = 80, therapeutic 6/5   2134 aPTT = 72, therapeutic X 2  6/6   0503 aPTT = 99 therapeutic X 3,  HL = 0.32    Goal of Therapy:  Heparin level 0.3-0.7 units/ml aPTT 66-102 seconds Monitor platelets by anticoagulation protocol: Yes   Plan:  6/6 @ 0503:  aPTT = 99,  HL = 0.32 - aPTT therapeutic X 3, now correlating with HL - Will use HL to guide dosing from  here on - Will continue pt on current rate and recheck HL on 6/7 with AM labs.  ---CBC daily  Euva Rundell D Clinical Pharmacist  04/01/2023 6:13 AM

## 2023-04-01 NOTE — Progress Notes (Signed)
   04/01/23 1400  Spiritual Encounters  Type of Visit Initial  Care provided to: Pt and family  Conversation partners present during encounter Other (comment)  Referral source Nurse (RN/NT/LPN)  Reason for visit Routine spiritual support  OnCall Visit No  Spiritual Framework  Presenting Themes Meaning/purpose/sources of inspiration;Values and beliefs;Courage hope and growth  Community/Connection Family;Friend(s);Faith community  Patient Stress Factors None identified  Family Stress Factors None identified  Interventions  Spiritual Care Interventions Made Established relationship of care and support;Reflective listening;Compassionate presence;Explored values/beliefs/practices/strengths;Encouragement  Intervention Outcomes  Outcomes Connection to spiritual care;Awareness around self/spiritual resourses   Nurse suggested that Chaplain visit with patient and family. Chaplain met with patient and her daughter and son in law. Patient told chaplain that her faith is what has been keeping her strong. She said her grandmother taught her about faith and it is still very important to her. Chaplain shared words of hope and encouragement with the patient. Chaplain informed patient that chaplain services is available to offer support whenever she needs. Patient thanked chaplain for coming and thanked me for speaking with her and family.

## 2023-04-01 NOTE — Progress Notes (Signed)
Progress Note   Patient: Jill Hudson ZOX:[id number] DOB: 1934/11/24 DOA: 03/25/2023     7 DOS: the patient was seen and examined on 04/01/2023   Brief hospital course: 88 year old female with history of COPD, HTN, HLD, DM2, stroke, TIA, GERD, anxiety, PVD, CAD, MI, former tobacco use, CKD stage III, GI bleed, iron deficiency anemia, PAD status post PCI comes to the hospital with shortness of breath and hypoxia. Patient was found to have bilateral pulmonary edema /effusion with elevated BNP.  Patient is admitted for further evaluation.  Cardiology is consulted.  Patient will eventually need right and left heart cath pending improvement in renal functions.   6/5.  Patient feeling a little bit better than when she came in.  On heparin drip, milrinone drip, Lasix drip for cardiogenic shock.  Amiodarone drip added for atrial fibrillation. 6/6.  Now on torsemide instead of Lasix drip.  Creatinine down to 2.16   Assessment and Plan: * Cardiogenic shock (HCC) Patient on milrinone drip.  Not on any pressors at this point.  Acute on chronic systolic CHF (congestive heart failure) (HCC) Patient on Lasix drip this morning and was switched over to torsemide.  Paroxysmal atrial fibrillation (HCC) Patient placed on amiodarone drip also.  Patient on heparin drip.  Acute kidney injury superimposed on CKD (HCC) AKI on CKD stage IIIa.  Creatinine was as high as 2.89.  Last creatinine 2.16  Myocardial injury Troponin peaked at 1263  COPD exacerbation (HCC) Continue nebulizers.  History of CVA (cerebrovascular accident) Patient on heparin drip for stroke prevention.  HLD (hyperlipidemia) On Crestor  Hyponatremia Sodium up to 132.  Iron deficiency anemia Last hemoglobin 8.1.  Continue to monitor.  Hypokalemia Replace potassium  Acute respiratory failure with hypoxia (HCC) Patient initially requiring BiPAP secondary to respiratory distress.  Currently on 2 L.        Subjective: Patient  feels okay.  Still with some shortness of breath and some weakness.  Admitted with cardiogenic shock and heart failure.  Physical Exam: Vitals:   04/01/23 1200 04/01/23 1300 04/01/23 1330 04/01/23 1400  BP: (!) 125/46  113/73   Pulse: 64 68 64 66  Resp: 18 14 17 18   Temp:      TempSrc:      SpO2: 97% 99% 97% 99%  Weight:      Height:       Physical Exam HENT:     Head: Normocephalic.     Mouth/Throat:     Pharynx: No oropharyngeal exudate.  Eyes:     General: Lids are normal.     Conjunctiva/sclera: Conjunctivae normal.  Cardiovascular:     Rate and Rhythm: Normal rate and regular rhythm.     Heart sounds: Normal heart sounds, S1 normal and S2 normal.  Pulmonary:     Breath sounds: Examination of the right-lower field reveals decreased breath sounds. Examination of the left-lower field reveals decreased breath sounds. Decreased breath sounds present. No wheezing, rhonchi or rales.  Abdominal:     Palpations: Abdomen is soft.     Tenderness: There is no abdominal tenderness.  Musculoskeletal:     Right lower leg: No swelling.     Left lower leg: No swelling.  Skin:    General: Skin is warm.     Findings: No rash.  Neurological:     Mental Status: She is alert and oriented to person, place, and time.     Data Reviewed: Sodium 132, creatinine 2.16, hemoglobin 8.1, white blood cell count  1.0, platelet count 205  Family Communication: Updated patient's daughter on the phone  Disposition: Status is: Inpatient Remains inpatient appropriate because: Continue milrinone drip.  Planned Discharge Destination: Home    Time spent: 28 minutes Case discussed with cardiology and nephrology  Author: Alford Highland, MD 04/01/2023 2:48 PM  For on call review [email or web address].

## 2023-04-02 ENCOUNTER — Inpatient Hospital Stay: Payer: Medicare HMO

## 2023-04-02 ENCOUNTER — Inpatient Hospital Stay (HOSPITAL_COMMUNITY)
Admit: 2023-04-02 | Discharge: 2023-04-02 | Disposition: A | Discharge status: Home / Self Care | Payer: Medicare HMO | Attending: Internal Medicine | Admitting: Internal Medicine

## 2023-04-02 DIAGNOSIS — I5023 Acute on chronic systolic (congestive) heart failure: Secondary | ICD-10-CM | POA: Diagnosis not present

## 2023-04-02 DIAGNOSIS — I10 Essential (primary) hypertension: Secondary | ICD-10-CM | POA: Diagnosis not present

## 2023-04-02 DIAGNOSIS — N179 Acute kidney failure, unspecified: Secondary | ICD-10-CM | POA: Diagnosis not present

## 2023-04-02 DIAGNOSIS — I129 Hypertensive chronic kidney disease with stage 1 through stage 4 chronic kidney disease, or unspecified chronic kidney disease: Secondary | ICD-10-CM | POA: Diagnosis not present

## 2023-04-02 DIAGNOSIS — I214 Non-ST elevation (NSTEMI) myocardial infarction: Principal | ICD-10-CM

## 2023-04-02 DIAGNOSIS — I340 Nonrheumatic mitral (valve) insufficiency: Secondary | ICD-10-CM | POA: Diagnosis not present

## 2023-04-02 DIAGNOSIS — J90 Pleural effusion, not elsewhere classified: Secondary | ICD-10-CM | POA: Insufficient documentation

## 2023-04-02 DIAGNOSIS — I509 Heart failure, unspecified: Secondary | ICD-10-CM | POA: Diagnosis not present

## 2023-04-02 DIAGNOSIS — I480 Paroxysmal atrial fibrillation: Secondary | ICD-10-CM | POA: Diagnosis not present

## 2023-04-02 DIAGNOSIS — Z8673 Personal history of transient ischemic attack (TIA), and cerebral infarction without residual deficits: Secondary | ICD-10-CM | POA: Diagnosis not present

## 2023-04-02 DIAGNOSIS — R570 Cardiogenic shock: Secondary | ICD-10-CM | POA: Diagnosis not present

## 2023-04-02 DIAGNOSIS — E785 Hyperlipidemia, unspecified: Secondary | ICD-10-CM | POA: Diagnosis not present

## 2023-04-02 DIAGNOSIS — E1121 Type 2 diabetes mellitus with diabetic nephropathy: Secondary | ICD-10-CM

## 2023-04-02 DIAGNOSIS — N1832 Chronic kidney disease, stage 3b: Secondary | ICD-10-CM | POA: Diagnosis not present

## 2023-04-02 DIAGNOSIS — I5021 Acute systolic (congestive) heart failure: Secondary | ICD-10-CM | POA: Diagnosis not present

## 2023-04-02 DIAGNOSIS — Z7189 Other specified counseling: Secondary | ICD-10-CM | POA: Diagnosis not present

## 2023-04-02 DIAGNOSIS — Z794 Long term (current) use of insulin: Secondary | ICD-10-CM

## 2023-04-02 DIAGNOSIS — E871 Hypo-osmolality and hyponatremia: Secondary | ICD-10-CM | POA: Diagnosis not present

## 2023-04-02 LAB — ECHOCARDIOGRAM LIMITED
Area-P 1/2: 3.6 cm2
Height: 60 in
MV M vel: 5.1 m/s
MV Peak grad: 104 mmHg
Radius: 0.53 cm
S' Lateral: 3.5 cm
Weight: 2102.31 oz

## 2023-04-02 LAB — MAGNESIUM: Magnesium: 1.9 mg/dL (ref 1.7–2.4)

## 2023-04-02 LAB — GLUCOSE, CAPILLARY
Glucose-Capillary: 136 mg/dL — ABNORMAL HIGH (ref 70–99)
Glucose-Capillary: 173 mg/dL — ABNORMAL HIGH (ref 70–99)
Glucose-Capillary: 140 mg/dL — ABNORMAL HIGH (ref 70–99)
Glucose-Capillary: 129 mg/dL — ABNORMAL HIGH (ref 70–99)

## 2023-04-02 LAB — BASIC METABOLIC PANEL
Anion gap: 9 (ref 5–15)
BUN: 72 mg/dL — ABNORMAL HIGH (ref 8–23)
CO2: 22 mmol/L (ref 22–32)
Calcium: 8.1 mg/dL — ABNORMAL LOW (ref 8.9–10.3)
Chloride: 99 mmol/L (ref 98–111)
Creatinine, Ser: 2.31 mg/dL — ABNORMAL HIGH (ref 0.44–1.00)
GFR, Estimated: 20 mL/min — ABNORMAL LOW (ref >60)
Glucose, Bld: 179 mg/dL — ABNORMAL HIGH (ref 70–99)
Potassium: 4.9 mmol/L (ref 3.5–5.1)
Sodium: 130 mmol/L — ABNORMAL LOW (ref 135–145)

## 2023-04-02 LAB — COOXEMETRY PANEL
Carboxyhemoglobin: 1.1 % (ref 0.5–1.5)
Methemoglobin: 1.2 % (ref 0.0–1.5)
O2 Saturation: 65.3 %
Total hemoglobin: 8.9 g/dL — ABNORMAL LOW (ref 12.0–16.0)
Total oxygen content: 63.9 %

## 2023-04-02 LAB — CBC
HCT: 26.2 % — ABNORMAL LOW (ref 36.0–46.0)
Hemoglobin: 8.3 g/dL — ABNORMAL LOW (ref 12.0–15.0)
MCH: 28.1 pg (ref 26.0–34.0)
MCHC: 31.7 g/dL (ref 30.0–36.0)
MCV: 88.8 fL (ref 80.0–100.0)
Platelets: 215 10*3/uL (ref 150–400)
RBC: 2.95 MIL/uL — ABNORMAL LOW (ref 3.87–5.11)
RDW: 13.6 % (ref 11.5–15.5)
WBC: 14.3 10*3/uL — ABNORMAL HIGH (ref 4.0–10.5)
nRBC: 0 % (ref 0.0–0.2)

## 2023-04-02 LAB — HEPARIN LEVEL (UNFRACTIONATED): Heparin Unfractionated: 0.4 IU/mL (ref 0.30–0.70)

## 2023-04-02 MED ORDER — APIXABAN 2.5 MG PO TABS
2.5000 mg | ORAL_TABLET | Freq: Two times a day (BID) | ORAL | Status: DC
  Administered 2023-04-02 – 2023-04-20 (×36): 2.5 mg via ORAL
  Filled 2023-04-02 (×36): qty 1

## 2023-04-02 MED ORDER — HYDRALAZINE HCL 25 MG PO TABS
25.0000 mg | ORAL_TABLET | Freq: Three times a day (TID) | ORAL | Status: DC
  Administered 2023-04-02 – 2023-04-07 (×13): 25 mg via ORAL
  Filled 2023-04-02 (×13): qty 1

## 2023-04-02 MED ORDER — ISOSORBIDE MONONITRATE ER 30 MG PO TB24
30.0000 mg | ORAL_TABLET | Freq: Every day | ORAL | Status: DC
  Administered 2023-04-02 – 2023-04-20 (×19): 30 mg via ORAL
  Filled 2023-04-02 (×19): qty 1

## 2023-04-02 MED ORDER — TORSEMIDE 20 MG PO TABS
40.0000 mg | ORAL_TABLET | Freq: Every day | ORAL | Status: DC
  Administered 2023-04-03 – 2023-04-04 (×2): 40 mg via ORAL
  Filled 2023-04-02 (×3): qty 2

## 2023-04-02 MED ORDER — AMIODARONE HCL 200 MG PO TABS
200.0000 mg | ORAL_TABLET | Freq: Two times a day (BID) | ORAL | Status: DC
  Administered 2023-04-02 – 2023-04-15 (×26): 200 mg via ORAL
  Filled 2023-04-02 (×26): qty 1

## 2023-04-02 MED ORDER — ALPRAZOLAM 0.25 MG PO TABS
0.2500 mg | ORAL_TABLET | ORAL | Status: AC | PRN
  Administered 2023-04-02: 0.25 mg via ORAL
  Filled 2023-04-02: qty 1

## 2023-04-02 NOTE — Progress Notes (Signed)
PT Cancellation Note  Patient Details Name: Jill Hudson MRN: [id number] DOB: [birth year]   Cancelled Treatment:    Reason Eval/Treat Not Completed: Patient at procedure or test/unavailable (Patient having a procedure at the bedside. PT will continue with attempts as appropriate.)  [name], PT, MPT  Ina Homes 04/02/2023, 3:31 PM

## 2023-04-02 NOTE — Assessment & Plan Note (Addendum)
With creatinine above 2, medical management.  Hold on beta-blocker at this point with acute systolic heart failure.  Continue aspirin, Imdur and low-dose Crestor

## 2023-04-02 NOTE — Progress Notes (Signed)
Daily Progress Note   Patient Name: Jill Hudson       Date: 04/02/2023 DOB: May 27, 1935  Age: 88 y.o. MRN#: [id number] Attending Physician: Alford Highland, MD Primary Care Physician: Reubin Milan, MD Admit Date: 03/25/2023  Reason for Consultation/Follow-up: Establishing goals of care  Subjective: Patient is sitting in bed with daughter at bedside.  Patient has been updated by cardiology and understands plans moving forward.  Patient has completed MOST form for DO NOT RESUSCITATE, limited interventions that she does not want to be placed on a ventilator, IV fluids for defined period, and no feeding tube.  Patient is amenable to antibiotics as needed.  I will follow-up Tuesday on my return to service.  Length of Stay: 8  Current Medications: Scheduled Meds:   amiodarone  200 mg Oral BID   apixaban  2.5 mg Oral BID   arformoterol  15 mcg Nebulization BID   aspirin EC  81 mg Oral Daily   Chlorhexidine Gluconate Cloth  6 each Topical Daily   cyanocobalamin  1,000 mcg Oral Daily   feeding supplement (GLUCERNA SHAKE)  237 mL Oral TID BM   gabapentin  100 mg Oral QHS   hydrALAZINE  25 mg Oral Q8H   insulin aspart  0-15 Units Subcutaneous TID WC   insulin aspart  0-5 Units Subcutaneous QHS   insulin glargine-yfgn  15 Units Subcutaneous QHS   isosorbide mononitrate  30 mg Oral Daily   multivitamin  1 tablet Oral BID   polyethylene glycol  17 g Oral Daily   potassium chloride  10 mEq Oral BID   promethazine  6.25 mg Oral QHS   revefenacin  175 mcg Nebulization Daily   rosuvastatin  10 mg Oral Once per day on Mon Wed Fri Sat   [START ON 04/03/2023] torsemide  40 mg Oral Daily    Continuous Infusions:   PRN Meds: acetaminophen, alum & mag hydroxide-simeth,  dextromethorphan-guaiFENesin, hydrALAZINE, levalbuterol, melatonin, ondansetron (ZOFRAN) IV, mouth rinse, oxyCODONE, phenol, senna-docusate, traZODone  Physical Exam Pulmonary:     Effort: Pulmonary effort is normal.  Neurological:     Mental Status: She is alert.             Vital Signs: BP (!) 118/57   Pulse 66   Temp 97.9 F (36.6 C) (Oral)  Resp (!) 22   Ht 5' (1.524 m)   Wt 59.6 kg   SpO2 98%   BMI 25.66 kg/m  SpO2: SpO2: 98 % O2 Device: O2 Device: Nasal Cannula O2 Flow Rate: O2 Flow Rate (L/min): 1 L/min  Intake/output summary:  Intake/Output Summary (Last 24 hours) at 04/02/2023 1344 Last data filed at 04/02/2023 1000 Gross per 24 hour  Intake 555.63 ml  Output 1100 ml  Net -544.37 ml   LBM: Last BM Date : 03/28/23 Baseline Weight: Weight: 56.2 kg Most recent weight: Weight: 59.6 kg   Patient Active Problem List   Diagnosis Date Noted   Pleural effusion on right 04/02/2023   Acute on chronic systolic CHF (congestive heart failure) (HCC) 03/31/2023   Paroxysmal atrial fibrillation (HCC) 03/31/2023   Hypokalemia 03/31/2023   Cardiogenic shock (HCC) 03/28/2023   COPD exacerbation (HCC) 03/25/2023   HLD (hyperlipidemia) 03/25/2023   Type II diabetes mellitus with renal manifestations (HCC) 03/25/2023   CAD (coronary artery disease) 03/25/2023   Iron deficiency anemia 03/25/2023   PAD (peripheral artery disease) (HCC) 03/25/2023   Acquired thrombophilia (HCC) 03/20/2021   Mood disorder (HCC) 05/02/2020   Chronic heart failure with preserved ejection fraction (HCC) 04/12/2020   Diverticulosis of large intestine without perforation or abscess with bleeding 03/27/2020   COPD (chronic obstructive pulmonary disease) with chronic bronchitis    NSTEMI (non-ST elevated myocardial infarction) (HCC)    Acute respiratory failure with hypoxia (HCC) 02/18/2020   CKD (chronic kidney disease), stage IIIa 02/18/2020   Hyponatremia 02/18/2020   Malnutrition of mild degree  (HCC) 01/08/2020   Acute kidney injury superimposed on CKD (HCC)    Age-related macular degeneration, dry, left eye 06/21/2019   Age-related macular degeneration, wet, right eye (HCC) 06/21/2019   Moderate nonproliferative diabetic retinopathy associated with type 2 diabetes mellitus (HCC) 06/21/2019   Lumbosacral radiculopathy at L4 05/01/2019   Underweight 05/01/2019   Encounter for long-term (current) use of aspirin 10/31/2018   Long term current use of oral hypoglycemic drug 10/31/2018   Encounter for long-term (current) use of insulin (HCC) 10/31/2018   Peptic ulcer disease 08/11/2018   Leg pain 07/03/2017   Carpal tunnel syndrome on both sides 05/05/2017   Myalgia due to HMG CoA reductase inhibitor 05/05/2017   History of CVA (cerebrovascular accident) 02/15/2017   Degenerative disc disease, lumbar 12/21/2016   Atherosclerosis of native arteries of extremity with intermittent claudication (HCC) 12/20/2016   Bilateral carotid artery stenosis 12/20/2016   Occlusion and stenosis of bilateral carotid arteries 12/20/2016   Hip bursitis 05/18/2016   Elevated TSH 01/18/2016   CKD stage 3 due to type 2 diabetes mellitus (HCC) 01/16/2016   Senile ecchymosis 01/16/2016   Type II diabetes mellitus with complication (HCC)    GERD (gastroesophageal reflux disease) 07/24/2015   PVD (peripheral vascular disease) (HCC) 05/17/2015   Neoplasm of uncertain behavior of skin 05/17/2015   Retinopathy, diabetic, proliferative (HCC) 04/11/2015   Hyperlipidemia associated with type 2 diabetes mellitus (HCC) 04/11/2015   Essential hypertension 04/11/2015   Generalized OA 04/11/2015   Proliferative diabetic retinopathy(362.02) 04/11/2015   Arteriosclerosis of coronary artery 05/29/2013   Hypertensive heart disease without CHF 05/29/2013    Palliative Care Assessment & Plan     Recommendations/Plan: Goals set currently.   I will follow-up Tuesday on my return to service.  Code Status:     Code Status Orders  (From admission, onward)           Start  Ordered   03/31/23 1502  Do not attempt resuscitation (DNR)  Continuous       Question Answer Comment  If patient has no pulse and is not breathing Do Not Attempt Resuscitation   If patient has a pulse and/or is breathing: Medical Treatment Goals LIMITED ADDITIONAL INTERVENTIONS: Use medication/IV fluids and cardiac monitoring as indicated; Do not use intubation or mechanical ventilation (DNI), also provide comfort medications.  Transfer to Progressive/Stepdown as indicated, avoid Intensive Care.   Consent: Discussion documented in EHR or advanced directives reviewed      03/31/23 1501           Code Status History     Date Active Date Inactive Code Status Order ID Comments User Context   03/25/2023 1214 03/31/2023 1501 Full Code [id number]  Lorretta Harp, MD ED   02/18/2020 1059 02/26/2020 2310 Full Code [id number]  Lorretta Harp, MD ED   12/25/2019 0555 12/28/2019 2204 Full Code [id number]  Mansy, Vernetta Honey, MD ED   10/12/2018 1647 10/15/2018 1808 Full Code [id number]  Schnier, Latina Craver, MD Inpatient   09/16/2018 1254 09/16/2018 2048 Full Code [id number]  Gilda Crease, Latina Craver, MD Inpatient   08/11/2018 1706 08/13/2018 2136 Full Code [id number]  Bertrum Sol, MD Inpatient   07/26/2018 0921 07/26/2018 1652 Full Code [id number]  Renford Dills, MD Inpatient   08/23/2017 0935 08/23/2017 1419 Full Code [id number]  Annice Needy, MD Inpatient   02/15/2017 1538 02/16/2017 1959 Full Code [id number]  Auburn Bilberry, MD Inpatient         Thank you for allowing the Palliative Medicine Team to assist in the care of this patient.   Morton Stall, NP  Please contact Palliative Medicine Team phone at [phone] for questions and concerns.

## 2023-04-02 NOTE — Progress Notes (Signed)
ANTICOAGULATION CONSULT NOTE  Pharmacy Consult for heparin infusion Indication: ACS/STEMI/ apixaban for Afib PTA  Allergies  Allergen Reactions   Cefdinir Diarrhea   Saxagliptin Diarrhea   Epinephrine Other (See Comments)    Patient does not remember what happens when she uses this   Evolocumab     Pain with injection   Atorvastatin Other (See Comments)    Muscle aches   Codeine Other (See Comments)    Upset stomach   Ezetimibe Other (See Comments)    Myalgias(ZETIA)   Limonene Rash    Patient does not recall this reaction   Nitrofurantoin Rash and Other (See Comments)    Pruitus   Sulfa Antibiotics Rash and Other (See Comments)    Sore mouth     Patient Measurements: Height: 5' (152.4 cm) Weight: 59.7 kg (131 lb 9.8 oz) IBW/kg (Calculated) : 45.5 Heparin Dosing Weight: 56.2 kg  Vital Signs: Temp: 98.6 F (37 C) (06/07 0000) Temp Source: Oral (06/07 0000) BP: 126/48 (06/07 0300) Pulse Rate: 71 (06/07 0300)  Labs: Recent Labs    03/31/23 0446 03/31/23 1446 03/31/23 2134 04/01/23 0503 04/02/23 0406  HGB 8.5*  --   --  8.1* 8.3*  HCT 25.3*  --   --  25.1* 26.2*  PLT 196  --   --  205 215  APTT 111* 80* 72* 99*  --   HEPARINUNFRC 0.53  --   --  0.32 0.40  CREATININE 2.42* 2.22*  --  2.16*  --      Estimated Creatinine Clearance: 15.1 mL/min (A) (by C-G formula based on SCr of 2.16 mg/dL (H)).   Medical History: Past Medical History:  Diagnosis Date   Acute CHF (congestive heart failure) (HCC) 02/18/2020   Acute diastolic CHF (congestive heart failure) (HCC)    Acute kidney injury superimposed on CKD (HCC)    Allergies    Anxiety    Arthritis    spine and shoulder   Atherosclerosis of artery of extremity with rest pain (HCC) 10/12/2018   Cancer (HCC)    skin   CHF (congestive heart failure) (HCC)    Diabetes mellitus without complication (HCC)    Diverticulosis of large intestine with hemorrhage    GI bleeding 12/25/2019   Heart murmur     Hyperlipidemia    Hypertension    Macula lutea degeneration    Mitral and aortic valve disease    Myocardial infarction (HCC)    may have had a "light" heart attack   Non-ST elevation (NSTEMI) myocardial infarction (HCC)    Occasional tremors    PAD (peripheral artery disease) (HCC)    Rectal bleeding    Shingles    patient unaware but daughter confirms. it was a long time ago   Stroke [facility]) 01/2017   may have had a slight stroke   TIA (transient ischemic attack) 01/2017   UTI (urinary tract infection)    Vascular disease, peripheral (HCC)     Medications:  PTA Meds: Apixaban 2.5 mg BID, last dose 5/30 @ 1856  Assessment: Pt is a 88 yo female initially presenting to ED w/ COPD exacerbation, now c/o increase WOB , found with increasing Troponin I levels.  5/31 1428 aPTT=95 Therapeutic x1 5/31 2228 aPTT=120, supratherapeutic 6/1   0802 aPTT=68/HL>1.10   aPTT therapeuticx1 6/1   1358 aPTT= 89  Therapeuticx2 6/2   0531 aPTT = 82, therapeutic x 3 / HL > 1.1, not correlating 6/3   0412 aPTT = 58, subtherapeutic /  HL 0.68, not correlating 6/3   1612 aPTT = 40, subtherapeutic 6/3   2315 aPTT = 176,  HL = 0.94 - possible sampling error 6/4   0345 aPTT = 197,  HL = 0.98 - elevated  6/4   1423 aPTT = 94 6/4   2315 aPTT = 77, therapeutic X 2  6/5   0446 aPTT = 111 (elevated), HL = 0.53 6/5   1446 aPTT = 80, therapeutic 6/5   2134 aPTT = 72, therapeutic X 2  6/6   0503 aPTT = 99 therapeutic X 3,  HL = 0.32  6/7   0406 HL = 0.40, therapeutic X 2    Goal of Therapy:  Heparin level 0.3-0.7 units/ml aPTT 66-102 seconds Monitor platelets by anticoagulation protocol: Yes   Plan:  6/7:  HL @ 0406 = 0.40, therapeutic X 2  - Will continue pt on current rate and recheck HL on 6/8 with AM labs.  ---CBC daily  Khrystina Bonnes D Clinical Pharmacist  04/02/2023 5:04 AM

## 2023-04-02 NOTE — Assessment & Plan Note (Signed)
Right thoracentesis removed 700 mL of fluid.  This was done by interventional radiology on 6/3.

## 2023-04-02 NOTE — Progress Notes (Signed)
Progress Note   Patient: Jill Hudson AVW:[id number] DOB: [birth year] DOA: 03/25/2023     8 DOS: the patient was seen and examined on 04/02/2023   Brief hospital course: 88 year old female with history of COPD, HTN, HLD, DM2, stroke, TIA, GERD, anxiety, PVD, CAD, MI, former tobacco use, CKD stage III, GI bleed, iron deficiency anemia, PAD status post PCI comes to the hospital with shortness of breath and hypoxia. Patient was found to have bilateral pulmonary edema /effusion with elevated BNP.  Patient is admitted for further evaluation.  Cardiology is consulted.  Patient will eventually need right and left heart cath pending improvement in renal functions.   6/5.  Patient feeling a little bit better than when she came in.  On heparin drip, milrinone drip, Lasix drip for cardiogenic shock.  Amiodarone drip added for atrial fibrillation. 6/6.  Now on torsemide instead of Lasix drip.  Creatinine down to 2.16 6/7.  Cardiology decided to discontinue milrinone drip.  Switching off amiodarone drip.  Converting heparin drip over to Eliquis.  Creatinine up to 2.31.  Patient in normal sinus rhythm.   Assessment and Plan: * Cardiogenic shock [facility]) Cardiology to take off milrinone drip.  Not on any pressors at this point.  Acute on chronic systolic CHF (congestive heart failure) (HCC) Patient on torsemide, hydralazine, Imdur.  No ACE/ARB or spironolactone with her kidney function.  Paroxysmal atrial fibrillation (HCC) Patient converted to normal sinus rhythm.  Taken off amiodarone drip and switched to oral.  Heparin drip converted over to Eliquis.  Acute kidney injury superimposed on CKD (HCC) AKI on CKD stage IIIa.  Creatinine was as high as 2.89.  Last creatinine 2.31  NSTEMI (non-ST elevated myocardial infarction) (HCC) With creatinine above 2, medical management.  Hold on beta-blocker at this point with acute systolic heart failure.  Continue aspirin, Imdur and low-dose Crestor  Type II diabetes  mellitus with renal manifestations (HCC) Last hemoglobin A1c 7.9, on 15 units of glargine insulin with sliding scale  COPD exacerbation (HCC) Continue nebulizers.  Essential hypertension Continue hydralazine and Imdur  History of CVA (cerebrovascular accident) Patient on Eliquis for stroke prevention  HLD (hyperlipidemia) On Crestor  Hyponatremia Sodium up to 130.  Iron deficiency anemia Last hemoglobin 8.3.  Continue to monitor.  Pleural effusion on right Right thoracentesis removed 700 mL of fluid.  This was done by interventional radiology on 6/3.  Hypokalemia Replaced  Acute respiratory failure with hypoxia (HCC) Patient initially requiring BiPAP secondary to respiratory distress.  Currently on 1 L.        Subjective: Patient feels okay.  Offers no complaints.  Converted to normal sinus rhythm.  Admitted with respiratory failure and heart failure  Physical Exam: Vitals:   04/02/23 0900 04/02/23 1000 04/02/23 1100 04/02/23 1200  BP: (!) 131/49 (!) 137/43 (!) 127/57 (!) 118/57  Pulse: 66 72 69 66  Resp: 19 (!) 21 19 (!) 22  Temp:      TempSrc:      SpO2: 93% 96% 98% 98%  Weight:      Height:       Physical Exam HENT:     Head: Normocephalic.     Mouth/Throat:     Pharynx: No oropharyngeal exudate.  Eyes:     General: Lids are normal.     Conjunctiva/sclera: Conjunctivae normal.  Cardiovascular:     Rate and Rhythm: Normal rate and regular rhythm.     Heart sounds: Normal heart sounds, S1 normal and S2 normal.  Pulmonary:     Breath sounds: Examination of the right-lower field reveals decreased breath sounds. Examination of the left-lower field reveals decreased breath sounds. Decreased breath sounds present. No wheezing, rhonchi or rales.  Abdominal:     Palpations: Abdomen is soft.     Tenderness: There is no abdominal tenderness.  Musculoskeletal:     Right lower leg: No swelling.     Left lower leg: No swelling.  Skin:    General: Skin is  warm.     Findings: No rash.  Neurological:     Mental Status: She is alert and oriented to person, place, and time.     Data Reviewed: Creatinine 2.31, GFR 20, sodium 130, white blood cell count 14.3, hemoglobin 8.3  Family Communication: Updated patient's daughter on the phone  Disposition: Status is: Inpatient Remains inpatient appropriate because: Follow closely coming off milrinone drip.  Will need rehab  Planned Discharge Destination: Rehab    Time spent: 27 minutes  Author: Alford Highland, MD 04/02/2023 12:25 PM  For on call review [email or web address].

## 2023-04-02 NOTE — Progress Notes (Signed)
*  PRELIMINARY RESULTS* Echocardiogram A Limited 2D Echocardiogram has been performed.  Jill Hudson 04/02/2023, 3:58 PM

## 2023-04-02 NOTE — Progress Notes (Signed)
       CROSS COVER NOTE  NAME: MARNIE FAZZINO MRN: [id number] DOB : 03/23/1935    Concern as stated by nurse / staff   Air hunger and anxiety with position changes in bed     Pertinent findings on chart review:   Assessment and  Interventions   Assessment:  Plan: One dose xanax 0.25 mg - report effectiveness or if anxiety not relieved       Donnie Mesa NP [facility] Cross Cover 7pm-7am - check amion for availability Pager [phone]

## 2023-04-02 NOTE — Progress Notes (Signed)
[facility]  ROUNDING NOTE   Subjective:   [name] was admitted to [facility] on 03/25/2023 for COPD exacerbation [facility]) [J44.1] Chest pain [R07.9] Community acquired pneumonia, unspecified laterality [J18.9]  Off milrinone   Changed to torsemide PO   UOP .   Converted to sinus rhythm on amiodarone  Started on hydralazine and isosorbide mononitrate.   Objective:  Vital signs in last 24 hours:  Temp:  [97.9 F (36.6 C)-98.6 F (37 C)] 97.9 F (36.6 C) (06/07 1200) Pulse Rate:  [61-72] 66 (06/07 1200) Resp:  [16-22] 22 (06/07 1200) BP: (118-137)/(43-59) 118/57 (06/07 1227) SpO2:  [93 %-98 %] 98 % (06/07 1200) Weight:  [59.6 kg] 59.6 kg (06/07 0802)  Weight change:  Filed Weights   03/31/23 0410 04/01/23 0152 04/02/23 0802  Weight: 62 kg 59.7 kg 59.6 kg    Intake/Output: I/O last 3 completed shifts: In: 1913.6 [P.O.:900; I.V.:1013.6] Out: 1800 [Urine:1800]   Intake/Output this shift:  Total I/O In: 148.5 [I.V.:148.5] Out: -   Physical Exam: General: NAD, sitting in bed  Head: Normocephalic, atraumatic. Moist oral mucosal membranes  Eyes: Anicteric  Lungs:  Basilar crackles   Heart: regular  Abdomen:  Soft, nontender,   Extremities:  no peripheral edema.  Neurologic: Nonfocal, moving all four extremities  Skin: No lesions        Basic Metabolic Panel: Recent Labs  Lab 03/28/23 0531 03/29/23 0412 03/30/23 0345 03/30/23 1423 03/31/23 0446 03/31/23 1446 04/01/23 0503 04/02/23 0406  NA 125*   < > 127* 126* 130* 131* 132* 130*  K 3.9   < > 3.6 3.6 3.0* 3.8 3.6 4.9  CL 94*   < > 96* 93* 98 97* 99 99  CO2 18*   < > 21* 22 22 23 22 22   GLUCOSE 218*   < > 224* 298* 125* 286* 114* 179*  BUN 76*   < > 92* 90* 79* 84* 77* 72*  CREATININE 2.84*   < > 2.69* 2.75* 2.42* 2.22* 2.16* 2.31*  CALCIUM 7.9*   < > 7.5* 7.5* 7.9* 8.1* 8.0* 8.1*  MG 2.1  --  2.2  --  2.2  --  2.0 1.9  PHOS 5.9*  --   --   --   --   --   --   --    < > =  values in this interval not displayed.     Liver Function Tests: Recent Labs  Lab 03/29/23 1612  PROT 5.3*    No results for input(s): "LIPASE", "AMYLASE" in the last 168 hours. No results for input(s): "AMMONIA" in the last 168 hours.  CBC: Recent Labs  Lab 03/29/23 0412 03/29/23 2315 03/31/23 0446 04/01/23 0503 04/02/23 0406  WBC 15.6* 11.9* 10.2 11.0* 14.3*  HGB 9.7* 9.3* 8.5* 8.1* 8.3*  HCT 29.4* 27.8* 25.3* 25.1* 26.2*  MCV 86.2 86.6 86.9 88.1 88.8  PLT 242 228 196 205 215     Cardiac Enzymes: No results for input(s): "CKTOTAL", "CKMB", "CKMBINDEX", "TROPONINI" in the last 168 hours.  BNP: Invalid input(s): "POCBNP"  CBG: Recent Labs  Lab 04/01/23 1204 04/01/23 1543 04/01/23 2131 04/02/23 0744 04/02/23 1121  GLUCAP 227* 240* 251* 136* 173*     Microbiology: Results for orders placed or performed during the hospital encounter of 03/25/23  Blood Culture (routine x 2)     Status: None   Collection Time: 03/25/23 12:09 PM   Specimen: BLOOD  Result Value Ref Range Status   Specimen  Description BLOOD LEFT AC  Final   Special Requests   Final    BOTTLES DRAWN AEROBIC AND ANAEROBIC Blood Culture adequate volume   Culture   Final    NO GROWTH 5 DAYS Performed at [facility], [address] [place], [place] [address]    Report Status 03/30/2023 FINAL  Final  Blood Culture (routine x 2)     Status: None   Collection Time: 03/25/23 12:10 PM   Specimen: BLOOD  Result Value Ref Range Status   Specimen Description BLOOD LEFT FA  Final   Special Requests   Final    BOTTLES DRAWN AEROBIC AND ANAEROBIC Blood Culture adequate volume   Culture   Final    NO GROWTH 5 DAYS Performed at [facility], [address], [place], [place] [address]    Report Status 03/30/2023 FINAL  Final  SARS Coronavirus 2 by RT PCR (hospital order, performed in [facility] hospital lab) *cepheid single result test* Anterior Nasal Swab     Status: None    Collection Time: 03/25/23  1:18 PM   Specimen: Anterior Nasal Swab  Result Value Ref Range Status   SARS Coronavirus 2 by RT PCR NEGATIVE NEGATIVE Final    Comment: (NOTE) SARS-CoV-2 target nucleic acids are NOT DETECTED.  The SARS-CoV-2 RNA is generally detectable in upper and lower respiratory specimens during the acute phase of infection. The lowest concentration of SARS-CoV-2 viral copies this assay can detect is 250 copies / mL. A negative result does not preclude SARS-CoV-2 infection and should not be used as the sole basis for treatment or other patient management decisions.  A negative result may occur with improper specimen collection / handling, submission of specimen other than nasopharyngeal swab, presence of viral mutation(s) within the areas targeted by this assay, and inadequate number of viral copies (<250 copies / mL). A negative result must be combined with clinical observations, patient history, and epidemiological information.  Fact Sheet for Patients:   RoadLapTop.co.za  Fact Sheet for Healthcare Providers: http://kim-miller.com/  This test is not yet approved or  cleared by the [place] FDA and has been authorized for detection and/or diagnosis of SARS-CoV-2 by FDA under an Emergency Use Authorization (EUA).  This EUA will remain in effect (meaning this test can be used) for the duration of the COVID-19 declaration under Section 564(b)(1) of the Act, 21 U.S.C. section 360bbb-3(b)(1), unless the authorization is terminated or revoked sooner.  Performed at [facility], [address] [place], [place] [address]   MRSA Next Gen by PCR, Nasal     Status: None   Collection Time: 03/27/23  4:40 AM   Specimen: Nasal Mucosa; Nasal Swab  Result Value Ref Range Status   MRSA by PCR Next Gen NOT DETECTED NOT DETECTED Final    Comment: (NOTE) The GeneXpert MRSA Assay (FDA approved for NASAL specimens  only), is one component of a comprehensive MRSA colonization surveillance program. It is not intended to diagnose MRSA infection nor to guide or monitor treatment for MRSA infections. Test performance is not FDA approved in patients less than 60 years old. Performed at [facility], [address] [place], [place] [address]   Respiratory (~20 pathogens) panel by PCR     Status: None   Collection Time: 03/28/23  4:00 PM   Specimen: Nasopharyngeal Swab; Respiratory  Result Value Ref Range Status   Adenovirus NOT DETECTED NOT DETECTED Final   Coronavirus 229E NOT DETECTED NOT DETECTED Final  Comment: (NOTE) The Coronavirus on the Respiratory Panel, DOES NOT test for the novel  Coronavirus (2019 nCoV)    Coronavirus HKU1 NOT DETECTED NOT DETECTED Final   Coronavirus NL63 NOT DETECTED NOT DETECTED Final   Coronavirus OC43 NOT DETECTED NOT DETECTED Final   Metapneumovirus NOT DETECTED NOT DETECTED Final   Rhinovirus / Enterovirus NOT DETECTED NOT DETECTED Final   Influenza A NOT DETECTED NOT DETECTED Final   Influenza B NOT DETECTED NOT DETECTED Final   Parainfluenza Virus 1 NOT DETECTED NOT DETECTED Final   Parainfluenza Virus 2 NOT DETECTED NOT DETECTED Final   Parainfluenza Virus 3 NOT DETECTED NOT DETECTED Final   Parainfluenza Virus 4 NOT DETECTED NOT DETECTED Final   Respiratory Syncytial Virus NOT DETECTED NOT DETECTED Final   Bordetella pertussis NOT DETECTED NOT DETECTED Final   Bordetella Parapertussis NOT DETECTED NOT DETECTED Final   Chlamydophila pneumoniae NOT DETECTED NOT DETECTED Final   Mycoplasma pneumoniae NOT DETECTED NOT DETECTED Final    Comment: Performed at [facility], [address], [place], [place] [address]  Body fluid culture w Gram Stain     Status: None   Collection Time: 03/29/23 10:27 AM   Specimen: PATH Cytology Pleural fluid  Result Value Ref Range Status   Specimen Description   Final    PLEURAL Performed at [facility], [address] [place], [place] [address]    Special Requests   Final    NONE Performed at [facility], [address] [place], [place] [address]    Gram Stain NO WBC SEEN NO ORGANISMS SEEN   Final   Culture   Final    NO GROWTH 3 DAYS Performed at [facility], [address], [place], [place] [address]    Report Status 04/01/2023 FINAL  Final  MRSA Next Gen by PCR, Nasal     Status: None   Collection Time: 03/29/23  3:00 PM   Specimen: Nasal Mucosa; Nasal Swab  Result Value Ref Range Status   MRSA by PCR Next Gen NOT DETECTED NOT DETECTED Final    Comment: (NOTE) The GeneXpert MRSA Assay (FDA approved for NASAL specimens only), is one component of a comprehensive MRSA colonization surveillance program. It is not intended to diagnose MRSA infection nor to guide or monitor treatment for MRSA infections. Test performance is not FDA approved in patients less than 81 years old. Performed at [facility], [address] [place], [place] [address]   Group A Strep by PCR     Status: None   Collection Time: 03/30/23  6:00 PM   Specimen: Throat; Sterile Swab  Result Value Ref Range Status   Group A Strep by PCR NOT DETECTED NOT DETECTED Final    Comment: Performed at [facility], [address] [place], [place] [address]    Coagulation Studies: No results for input(s): "LABPROT", "INR" in the last 72 hours.   Urinalysis: No results for input(s): "COLORURINE", "LABSPEC", "PHURINE", "GLUCOSEU", "HGBUR", "BILIRUBINUR", "KETONESUR", "PROTEINUR", "UROBILINOGEN", "NITRITE", "LEUKOCYTESUR" in the last 72 hours.  Invalid input(s): "APPERANCEUR"    Imaging: US RENAL  Result Date: 04/01/2023 CLINICAL DATA:  Acute kidney failure EXAM: RENAL / URINARY TRACT ULTRASOUND COMPLETE COMPARISON:  02/21/2020 FINDINGS: Right Kidney: Renal measurements: 10.4 x 4.6 x 4.5 cm = volume: 111.4 mL. No collecting system dilatation or perinephric fluid.  Right-sided simple appearing renal cysts identified measuring up to 11 mm centrally. Not as well seen on the prior  renal ultrasound. Left Kidney: Renal measurements: 10.1 x 4.6 x 4.3 cm = volume: 102.6 mL. No collecting system dilatation or perinephric fluid. Left-sided renal cysts are identified which appears simple. Largest towards the upper pole measures 2.4 cm. Previously this measured 3.3 cm. Bladder: Foley catheter in place.  Underdistended bladder. Other: Pleural effusion incidentally seen on the right. IMPRESSION: No collecting system dilatation. Bilateral renal cysts. Right-sided pleural effusion. Electronically Signed   By: Karen Kays M.D.   On: 04/01/2023 17:39   DG Chest Port 1 View  Result Date: 04/01/2023 CLINICAL DATA:  Congestive heart failure. EXAM: PORTABLE CHEST 1 VIEW COMPARISON:  Radiograph 03/29/2023, CT 03/28/2023 FINDINGS: Left central line is unchanged in position. Cardiomegaly is stable. Unchanged mediastinal contours with aortic atherosclerosis. Stable left pleural effusion and basilar opacity. Increasing right pleural effusion. Fluid in the right minor fissure. Mild vascular congestion. No pneumothorax. IMPRESSION: 1. Increasing right pleural effusion. 2. Stable left pleural effusion and basilar opacity. 3. Stable cardiomegaly.  Mild vascular congestion. Electronically Signed   By: Narda Rutherford M.D.   On: 04/01/2023 09:02     Medications:      amiodarone  200 mg Oral BID   apixaban  2.5 mg Oral BID   arformoterol  15 mcg Nebulization BID   aspirin EC  81 mg Oral Daily   Chlorhexidine Gluconate Cloth  6 each Topical Daily   cyanocobalamin  1,000 mcg Oral Daily   feeding supplement (GLUCERNA SHAKE)  237 mL Oral TID BM   gabapentin  100 mg Oral QHS   hydrALAZINE  25 mg Oral Q8H   insulin aspart  0-15 Units Subcutaneous TID WC   insulin aspart  0-5 Units Subcutaneous QHS   insulin glargine-yfgn  15 Units Subcutaneous QHS   isosorbide mononitrate  30 mg Oral Daily    multivitamin  1 tablet Oral BID   polyethylene glycol  17 g Oral Daily   potassium chloride  10 mEq Oral BID   promethazine  6.25 mg Oral QHS   revefenacin  175 mcg Nebulization Daily   rosuvastatin  10 mg Oral Once per day on Mon Wed Fri Sat   [START ON 04/03/2023] torsemide  40 mg Oral Daily   acetaminophen, alum & mag hydroxide-simeth, dextromethorphan-guaiFENesin, hydrALAZINE, levalbuterol, melatonin, ondansetron (ZOFRAN) IV, mouth rinse, oxyCODONE, phenol, senna-docusate, traZODone  Assessment/ Plan:  [name] is a 88 y.o.  female with diabetes mellitus type II, hypertension, coronary artery disease, peripheral arterial disease, tremor, CVA, congestive heart failure and anemia who is admitted to [facility] on 03/25/2023 for COPD exacerbation (HCC) [J44.1] Chest pain [R07.9] Community acquired pneumonia, unspecified laterality [J18.9]  Acute kidney injury with acute metabolic acidosis on chronic kidney disease stage IIIB: baseline creatinine of 1.48, GFR of 34 on 12/09/2022. Acute kidney injury secondary to acute cardiorenal syndrome . Chronic kidney disease secondary to hypertension.   Hypertension with chronic kidney disease: with chronic systolic and diastolic congestive heart failure.  Appreciate cardiology input.  Started on new blood pressure regimen this admission.   Hyponatremia: -Sodium slowly improving with volume status.    LOS: 8 Jill Hudson 6/7/20243:22 PM

## 2023-04-02 NOTE — Progress Notes (Signed)
Advanced Heart Failure Team Progress Note   Primary Physician: Reubin Milan, MD PCP-Cardiologist:  None  Reason for Consultation: Acute systolic HF  Interval history   Remains on milrinone 0.125. IV lasix stopped. CVP 6-7 Co-ox pending  Scr 2.75 -> 2.42 -> 2.16 - >2.31  Back in NSR on IV amio   Feels weak. No CP or SOB.  CXR with small left effusion  Personally reviewed   Objective:    Vital Signs:   Temp:  [97.9 F (36.6 C)-98.6 F (37 C)] 97.9 F (36.6 C) (06/07 0802) Pulse Rate:  [61-75] 66 (06/07 0800) Resp:  [14-22] 17 (06/07 0800) BP: (102-130)/(42-73) 123/59 (06/07 0800) SpO2:  [93 %-99 %] 94 % (06/07 0800) Weight:  [59.6 kg] 59.6 kg (06/07 0802) Last BM Date : 03/28/23  Weight change: Filed Weights   03/31/23 0410 04/01/23 0152 04/02/23 0802  Weight: 62 kg 59.7 kg 59.6 kg    Intake/Output:   Intake/Output Summary (Last 24 hours) at 04/02/2023 1022 Last data filed at 04/02/2023 0400 Gross per 24 hour  Intake 956.36 ml  Output 1100 ml  Net -143.64 ml     Physical Exam    General:  Weak appearing. No resp difficulty HEENT: normal Neck: supple. JVP 6 LIJ TLC Carotids 2+ bilat; no bruits. No lymphadenopathy or thryomegaly appreciated. Cor: PMI nondisplaced. Regular rate & rhythm. No rubs, gallops or murmurs. Lungs: crackles at left base  Abdomen: soft, nontender, nondistended. No hepatosplenomegaly. No bruits or masses. Good bowel sounds. Extremities: no cyanosis, clubbing, rash, edema Neuro: alert & orientedx3, cranial nerves grossly intact. moves all 4 extremities w/o difficulty. Affect pleasant   Telemetry   SR 60s Personally reviewed  Labs   Basic Metabolic Panel: Recent Labs  Lab 03/28/23 0531 03/29/23 0412 03/30/23 0345 03/30/23 1423 03/31/23 0446 03/31/23 1446 04/01/23 0503 04/02/23 0406  NA 125*   < > 127* 126* 130* 131* 132* 130*  K 3.9   < > 3.6 3.6 3.0* 3.8 3.6 4.9  CL 94*   < > 96* 93* 98 97* 99 99  CO2 18*   < >  21* 22 22 23 22 22   GLUCOSE 218*   < > 224* 298* 125* 286* 114* 179*  BUN 76*   < > 92* 90* 79* 84* 77* 72*  CREATININE 2.84*   < > 2.69* 2.75* 2.42* 2.22* 2.16* 2.31*  CALCIUM 7.9*   < > 7.5* 7.5* 7.9* 8.1* 8.0* 8.1*  MG 2.1  --  2.2  --  2.2  --  2.0 1.9  PHOS 5.9*  --   --   --   --   --   --   --    < > = values in this interval not displayed.     Liver Function Tests: Recent Labs  Lab 03/29/23 1612  PROT 5.3*    No results for input(s): "LIPASE", "AMYLASE" in the last 168 hours. No results for input(s): "AMMONIA" in the last 168 hours.  CBC: Recent Labs  Lab 03/29/23 0412 03/29/23 2315 03/31/23 0446 04/01/23 0503 04/02/23 0406  WBC 15.6* 11.9* 10.2 11.0* 14.3*  HGB 9.7* 9.3* 8.5* 8.1* 8.3*  HCT 29.4* 27.8* 25.3* 25.1* 26.2*  MCV 86.2 86.6 86.9 88.1 88.8  PLT 242 228 196 205 215     Cardiac Enzymes: No results for input(s): "CKTOTAL", "CKMB", "CKMBINDEX", "TROPONINI" in the last 168 hours.  BNP: BNP (last 3 results) Recent Labs    03/25/23 1001  BNP 971.3*     ProBNP (last 3 results) No results for input(s): "PROBNP" in the last 8760 hours.   CBG: Recent Labs  Lab 04/01/23 0728 04/01/23 1204 04/01/23 1543 04/01/23 2131 04/02/23 0744  GLUCAP 115* 227* 240* 251* 136*     Coagulation Studies: No results for input(s): "LABPROT", "INR" in the last 72 hours.   Imaging   US RENAL  Result Date: 04/01/2023 CLINICAL DATA:  Acute kidney failure EXAM: RENAL / URINARY TRACT ULTRASOUND COMPLETE COMPARISON:  02/21/2020 FINDINGS: Right Kidney: Renal measurements: 10.4 x 4.6 x 4.5 cm = volume: 111.4 mL. No collecting system dilatation or perinephric fluid. Right-sided simple appearing renal cysts identified measuring up to 11 mm centrally. Not as well seen on the prior renal ultrasound. Left Kidney: Renal measurements: 10.1 x 4.6 x 4.3 cm = volume: 102.6 mL. No collecting system dilatation or perinephric fluid. Left-sided renal cysts are identified which  appears simple. Largest towards the upper pole measures 2.4 cm. Previously this measured 3.3 cm. Bladder: Foley catheter in place.  Underdistended bladder. Other: Pleural effusion incidentally seen on the right. IMPRESSION: No collecting system dilatation. Bilateral renal cysts. Right-sided pleural effusion. Electronically Signed   By: Karen Kays M.D.   On: 04/01/2023 17:39     Medications:     Current Medications:  arformoterol  15 mcg Nebulization BID   aspirin EC  81 mg Oral Daily   Chlorhexidine Gluconate Cloth  6 each Topical Daily   cyanocobalamin  1,000 mcg Oral Daily   feeding supplement (GLUCERNA SHAKE)  237 mL Oral TID BM   gabapentin  100 mg Oral QHS   insulin aspart  0-15 Units Subcutaneous TID WC   insulin aspart  0-5 Units Subcutaneous QHS   insulin glargine-yfgn  15 Units Subcutaneous QHS   multivitamin  1 tablet Oral BID   polyethylene glycol  17 g Oral Daily   potassium chloride  10 mEq Oral BID   promethazine  6.25 mg Oral QHS   revefenacin  175 mcg Nebulization Daily   rosuvastatin  10 mg Oral Once per day on Mon Wed Fri Sat   [START ON 04/03/2023] torsemide  40 mg Oral Daily    Infusions:  amiodarone 30 mg/hr (04/02/23 0013)   heparin 750 Units/hr (04/01/23 1600)   milrinone 0.125 mcg/kg/min (04/02/23 0657)      Assessment/Plan   1. Acute systolic HF -> cardiogenic shock - Echo 2023 EF 55-60% - Echo this admit read as EF 30-35%. RV ok  -> I reviewed. EF 35-40% there is severe HK of basaliar to mid lateral wall and inferolateral wall - High-sensitivity troponin 225 -> 458 - >1186 -> 1165.  BNP 971  - Suspect this is an ischemic CM  - Developed low output HF and elevated volume status. Initial co-ox 44% CVP 21 (03/29/23) - Now improving with milrinone. Remains on milrinone 0.125 Co-ox ok - CVP 6-7 - Await co-ox. If > 60%, will stop milrinone - Continue to titrate GDMT - Given Scr is starting to level out > 2.0 and no current angina will defer cath this  admission. - Will repeat echo to reassess MR and EF - Need to mobilize - Not candidate for ACE/ARB/ARNI/MRA with kidney injury at this point - Would not use SGLT2i until Foley out - No b-blocker yet - Will start hydral/nitrates   2. NSTEMI - echo as above with RWMA - no longer having CP - continue ASA/statin - Given Scr is starting to level out >  2.0 and no current angina will defer cath this admission. - No b-blocker yet - will need ambulation/eventual CR  3. AKI on CKD 3a - likely ATN/cardiorenal  - Scr 1.5 -> 2.8 -> 2.7 -> 2.4 -> 2.1 -> 2.3 - seems to be plateuaing. Can stop milrinone  - renal u/s 3/21 with medico-renal disease. May be worth repeating - renal following  4. Acute hypoxic respiratory failure - likely due primarily to HF and pleural effusions on top of underlying COPD - s/p R thora on 6/3 with 700cc out  - CXR improved. Lung u/s with CCM at bedside. -> small left effusion  5. PAF  - back in NSR now - If co-ox > 60% Stop milrinone and IV amio and IV heparin Switch to Eliquis (renally dosed) and amio 200 bid  6. Hypervolemic hyponatremia - restrict FW, treat HF - improving  7. Urinary retention - needs Foley out   8. DNR/DNI/no dialysis - confirmed with patient and her daughter  D/w daughter at bedside   Length of Stay: 8  Arvilla Meres, MD  04/02/2023, 10:22 AM  Advanced Heart Failure Team Pager [phone] (M-F; 7a - 5p)  Please contact [facility] for night-coverage after hours (4p -7a ) and weekends on [email or web address]

## 2023-04-02 NOTE — Assessment & Plan Note (Signed)
Continue hydralazine, Imdur and low-dose Coreg.

## 2023-04-02 NOTE — Assessment & Plan Note (Signed)
Last hemoglobin A1c 7.9.  Hypoglycemic episode this morning.  Decreased Semglee insulin down to 8 units at night.  Continue sliding scale.

## 2023-04-03 DIAGNOSIS — J189 Pneumonia, unspecified organism: Secondary | ICD-10-CM

## 2023-04-03 DIAGNOSIS — I480 Paroxysmal atrial fibrillation: Secondary | ICD-10-CM | POA: Diagnosis not present

## 2023-04-03 DIAGNOSIS — J90 Pleural effusion, not elsewhere classified: Secondary | ICD-10-CM | POA: Diagnosis not present

## 2023-04-03 DIAGNOSIS — J441 Chronic obstructive pulmonary disease with (acute) exacerbation: Secondary | ICD-10-CM | POA: Diagnosis not present

## 2023-04-03 DIAGNOSIS — N179 Acute kidney failure, unspecified: Secondary | ICD-10-CM | POA: Diagnosis not present

## 2023-04-03 DIAGNOSIS — I10 Essential (primary) hypertension: Secondary | ICD-10-CM | POA: Diagnosis not present

## 2023-04-03 DIAGNOSIS — E875 Hyperkalemia: Secondary | ICD-10-CM

## 2023-04-03 DIAGNOSIS — I739 Peripheral vascular disease, unspecified: Secondary | ICD-10-CM

## 2023-04-03 DIAGNOSIS — E1121 Type 2 diabetes mellitus with diabetic nephropathy: Secondary | ICD-10-CM | POA: Diagnosis not present

## 2023-04-03 DIAGNOSIS — R570 Cardiogenic shock: Secondary | ICD-10-CM | POA: Diagnosis not present

## 2023-04-03 DIAGNOSIS — E871 Hypo-osmolality and hyponatremia: Secondary | ICD-10-CM | POA: Diagnosis not present

## 2023-04-03 DIAGNOSIS — I5023 Acute on chronic systolic (congestive) heart failure: Secondary | ICD-10-CM | POA: Diagnosis not present

## 2023-04-03 DIAGNOSIS — I214 Non-ST elevation (NSTEMI) myocardial infarction: Secondary | ICD-10-CM | POA: Diagnosis not present

## 2023-04-03 LAB — BASIC METABOLIC PANEL
Anion gap: 11 (ref 5–15)
BUN: 69 mg/dL — ABNORMAL HIGH (ref 8–23)
CO2: 21 mmol/L — ABNORMAL LOW (ref 22–32)
Calcium: 8.5 mg/dL — ABNORMAL LOW (ref 8.9–10.3)
Chloride: 97 mmol/L — ABNORMAL LOW (ref 98–111)
Creatinine, Ser: 2.5 mg/dL — ABNORMAL HIGH (ref 0.44–1.00)
GFR, Estimated: 18 mL/min — ABNORMAL LOW (ref >60)
Glucose, Bld: 94 mg/dL (ref 70–99)
Potassium: 5.3 mmol/L — ABNORMAL HIGH (ref 3.5–5.1)
Sodium: 129 mmol/L — ABNORMAL LOW (ref 135–145)

## 2023-04-03 LAB — GLUCOSE, CAPILLARY
Glucose-Capillary: 83 mg/dL (ref 70–99)
Glucose-Capillary: 137 mg/dL — ABNORMAL HIGH (ref 70–99)
Glucose-Capillary: 103 mg/dL — ABNORMAL HIGH (ref 70–99)
Glucose-Capillary: 104 mg/dL — ABNORMAL HIGH (ref 70–99)

## 2023-04-03 LAB — COOXEMETRY PANEL
Carboxyhemoglobin: 1.1 % (ref 0.5–1.5)
Methemoglobin: <0.7 % (ref 0.0–1.5)
O2 Saturation: 57.6 %
Total hemoglobin: 20.3 g/dL (ref 12.0–16.0)
Total oxygen content: 56.9 %

## 2023-04-03 LAB — CBC
HCT: 26.4 % — ABNORMAL LOW (ref 36.0–46.0)
Hemoglobin: 8.6 g/dL — ABNORMAL LOW (ref 12.0–15.0)
MCH: 29 pg (ref 26.0–34.0)
MCHC: 32.6 g/dL (ref 30.0–36.0)
MCV: 88.9 fL (ref 80.0–100.0)
Platelets: 204 10*3/uL (ref 150–400)
RBC: 2.97 MIL/uL — ABNORMAL LOW (ref 3.87–5.11)
RDW: 13.8 % (ref 11.5–15.5)
WBC: 11.7 10*3/uL — ABNORMAL HIGH (ref 4.0–10.5)
nRBC: 0 % (ref 0.0–0.2)

## 2023-04-03 LAB — MAGNESIUM: Magnesium: 2.1 mg/dL (ref 1.7–2.4)

## 2023-04-03 MED ORDER — ALPRAZOLAM 0.25 MG PO TABS
0.2500 mg | ORAL_TABLET | ORAL | Status: AC | PRN
  Administered 2023-04-03: 0.25 mg via ORAL
  Filled 2023-04-03: qty 1

## 2023-04-03 MED ORDER — PATIROMER SORBITEX CALCIUM 8.4 G PO PACK
16.8000 g | PACK | Freq: Every day | ORAL | Status: DC
  Administered 2023-04-03: 16.8 g via ORAL
  Filled 2023-04-03 (×3): qty 2

## 2023-04-03 MED ORDER — CARVEDILOL 3.125 MG PO TABS
3.1250 mg | ORAL_TABLET | Freq: Two times a day (BID) | ORAL | Status: DC
  Administered 2023-04-03 – 2023-04-20 (×26): 3.125 mg via ORAL
  Filled 2023-04-03 (×28): qty 1

## 2023-04-03 NOTE — Assessment & Plan Note (Signed)
Previously treated. ?

## 2023-04-03 NOTE — Progress Notes (Signed)
States she gets very fatigued when she attempts to eat anything, had difficulty even eating a few bites of applesauce to take meds.

## 2023-04-03 NOTE — Progress Notes (Addendum)
Made Dr. Mariah Milling aware coox resulted total hgb 20.3. Current CVP increasing to 12. Md ordered to send new coox

## 2023-04-03 NOTE — Progress Notes (Signed)
Made Dr. Thedore Mins aware bladder scan result 201cc per md do in and out cath. Post in and out void 300cc

## 2023-04-03 NOTE — Progress Notes (Signed)
Rounding Note    Patient Name: Jill Hudson Date of Encounter: 04/03/2023  [facility] Cardiologist: [facility]-advanced heart failure  Subjective   Laying supine in bed on nasal cannula oxygen, no complaints Off milrinone Foley catheter removed Creatinine 2.5 with trend up in potassium greater than 5 CVP  5 Feels weak Co. ox panel pending  Inpatient Medications    Scheduled Meds:  amiodarone  200 mg Oral BID   apixaban  2.5 mg Oral BID   arformoterol  15 mcg Nebulization BID   aspirin EC  81 mg Oral Daily   Chlorhexidine Gluconate Cloth  6 each Topical Daily   cyanocobalamin  1,000 mcg Oral Daily   feeding supplement (GLUCERNA SHAKE)  237 mL Oral TID BM   gabapentin  100 mg Oral QHS   hydrALAZINE  25 mg Oral Q8H   insulin aspart  0-15 Units Subcutaneous TID WC   insulin aspart  0-5 Units Subcutaneous QHS   insulin glargine-yfgn  15 Units Subcutaneous QHS   isosorbide mononitrate  30 mg Oral Daily   multivitamin  1 tablet Oral BID   patiromer  16.8 g Oral Daily   polyethylene glycol  17 g Oral Daily   promethazine  6.25 mg Oral QHS   revefenacin  175 mcg Nebulization Daily   rosuvastatin  10 mg Oral Once per day on Mon Wed Fri Sat   torsemide  40 mg Oral Daily   Continuous Infusions:  PRN Meds: acetaminophen, alum & mag hydroxide-simeth, dextromethorphan-guaiFENesin, hydrALAZINE, levalbuterol, melatonin, ondansetron (ZOFRAN) IV, mouth rinse, oxyCODONE, phenol, senna-docusate, traZODone   Vital Signs    Vitals:   04/03/23 0718 04/03/23 0750 04/03/23 0800 04/03/23 1000  BP:   133/62 124/61  Pulse:   68 77  Resp:   18 (!) 22  Temp:  97.7 F (36.5 C)    TempSrc:  Oral    SpO2:   94% 97%  Weight: 59.5 kg     Height:        Intake/Output Summary (Last 24 hours) at 04/03/2023 1155 Last data filed at 04/02/2023 1703 Gross per 24 hour  Intake 77.35 ml  Output 300 ml  Net -222.65 ml      04/03/2023    7:18 AM 04/02/2023    8:02 AM 04/01/2023    1:52 AM   Last 3 Weights  Weight (lbs) 131 lb 2.8 oz 131 lb 6.3 oz 131 lb 9.8 oz  Weight (kg) 59.5 kg 59.6 kg 59.7 kg      Telemetry    Normal sinus rhythm- Personally Reviewed  ECG     - Personally Reviewed  Physical Exam   GEN: No acute distress.   Neck: No JVD Cardiac: RRR, no murmurs, rubs, or gallops.  Respiratory: Clear to auscultation bilaterally. GI: Soft, nontender, non-distended  MS: No edema; No deformity. Neuro:  Nonfocal  Psych: Normal affect   Labs    High Sensitivity Troponin:   Recent Labs  Lab 03/25/23 1857 03/25/23 2156 03/26/23 0444 03/26/23 1428 03/26/23 1711  TROPONINIHS 225* 458* 1,186* 1,165* 1,263*     Chemistry Recent Labs  Lab 03/29/23 1612 03/29/23 2315 04/01/23 0503 04/02/23 0406 04/03/23 0505  NA  --    < > 132* 130* 129*  K  --    < > 3.6 4.9 5.3*  CL  --    < > 99 99 97*  CO2  --    < > 22 22 21*  GLUCOSE  --    < >  114* 179* 94  BUN  --    < > 77* 72* 69*  CREATININE  --    < > 2.16* 2.31* 2.50*  CALCIUM  --    < > 8.0* 8.1* 8.5*  MG  --    < > 2.0 1.9 2.1  PROT 5.3*  --   --   --   --   GFRNONAA  --    < > 22* 20* 18*  ANIONGAP  --    < > 11 9 11    < > = values in this interval not displayed.    Lipids No results for input(s): "CHOL", "TRIG", "HDL", "LABVLDL", "LDLCALC", "CHOLHDL" in the last 168 hours.  Hematology Recent Labs  Lab 04/01/23 0503 04/02/23 0406 04/03/23 0505  WBC 11.0* 14.3* 11.7*  RBC 2.85* 2.95* 2.97*  HGB 8.1* 8.3* 8.6*  HCT 25.1* 26.2* 26.4*  MCV 88.1 88.8 88.9  MCH 28.4 28.1 29.0  MCHC 32.3 31.7 32.6  RDW 13.2 13.6 13.8  PLT 205 215 204   Thyroid No results for input(s): "TSH", "FREET4" in the last 168 hours.  BNPNo results for input(s): "BNP", "PROBNP" in the last 168 hours.  DDimer No results for input(s): "DDIMER" in the last 168 hours.   Radiology    DG Chest Port 1 View  Result Date: 04/02/2023 CLINICAL DATA:  Congestive heart failure. EXAM: PORTABLE CHEST 1 VIEW COMPARISON:  04/01/2023  FINDINGS: Stable heart size. Stable positioning of left jugular central line with the catheter tip at the SVC/RA junction. Congestive heart failure slightly increased bilaterally. Increase in bilateral pleural effusions with likely slightly greater volume on the left compared to the right. No pneumothorax. IMPRESSION: Increase in congestive heart failure and bilateral pleural effusions. Electronically Signed   By: Irish Lack M.D.   On: 04/02/2023 17:21   ECHOCARDIOGRAM LIMITED  Result Date: 04/02/2023    ECHOCARDIOGRAM LIMITED REPORT   Patient Name:   Jill Hudson Date of Exam: 04/02/2023 Medical Rec #:  [id number]     Height:       60.0 in Accession #:    [id number]    Weight:       131.4 lb Date of Birth:  1935/04/21      BSA:          1.561 m Patient Age:    88 years      BP:           118/57 mmHg Patient Gender: F             HR:           70 bpm. Exam Location:  [facility] Procedure: Limited Echo, Cardiac Doppler and Color Doppler Indications:     Mitral valve disorder  History:         Patient has prior history of Echocardiogram examinations, most                  recent 03/26/2023. CHF, CAD and Acute MI, PAD, COPD and Stroke;                  Risk Factors:Hypertension, Diabetes and Dyslipidemia. CKD.  Sonographer:     Mikki Harbor Referring Phys:  [id number] [name] Diagnosing Phys: Julien Nordmann MD IMPRESSIONS  1. Left ventricular ejection fraction, by estimation, is 35 to 40%. The left ventricle has moderately decreased function. The left ventricle demonstrates global hypokinesis. Left ventricular diastolic parameters are indeterminate.  2. Right ventricular systolic function is normal.  The right ventricular size is normal. Tricuspid regurgitation signal is inadequate for assessing PA pressure.  3. Left atrial size was moderately dilated.  4. Moderate pleural effusion in the left lateral region.  5. The mitral valve is normal in structure. Moderate to severe mitral valve regurgitation. No evidence  of mitral stenosis.  6. The aortic valve is normal in structure. Aortic valve regurgitation is not visualized. No aortic stenosis is present.  7. The inferior vena cava is normal in size with <50% respiratory variability, suggesting right atrial pressure of 8 mmHg. FINDINGS  Left Ventricle: Left ventricular ejection fraction, by estimation, is 35 to 40%. The left ventricle has moderately decreased function. The left ventricle demonstrates global hypokinesis. The left ventricular internal cavity size was normal in size. There is no left ventricular hypertrophy. Left ventricular diastolic parameters are indeterminate. Right Ventricle: The right ventricular size is normal. No increase in right ventricular wall thickness. Right ventricular systolic function is normal. Tricuspid regurgitation signal is inadequate for assessing PA pressure. Left Atrium: Left atrial size was moderately dilated. Right Atrium: Right atrial size was normal in size. Pericardium: There is no evidence of pericardial effusion. Mitral Valve: The mitral valve is normal in structure. Moderate to severe mitral valve regurgitation, with eccentric posteriorly directed jet. No evidence of mitral valve stenosis. MV peak gradient, 14.3 mmHg. The mean mitral valve gradient is 4.0 mmHg. Tricuspid Valve: The tricuspid valve is normal in structure. Tricuspid valve regurgitation is not demonstrated. No evidence of tricuspid stenosis. Aortic Valve: The aortic valve is normal in structure. Aortic valve regurgitation is not visualized. No aortic stenosis is present. Pulmonic Valve: The pulmonic valve was normal in structure. Pulmonic valve regurgitation is not visualized. No evidence of pulmonic stenosis. Aorta: The aortic root is normal in size and structure. Venous: The inferior vena cava is normal in size with less than 50% respiratory variability, suggesting right atrial pressure of 8 mmHg. IAS/Shunts: No atrial level shunt detected by color flow Doppler.  Additional Comments: There is a moderate pleural effusion in the left lateral region.  LEFT VENTRICLE PLAX 2D LVIDd:         5.10 cm   Diastology LVIDs:         3.50 cm   LV e' medial:    7.18 cm/s LV PW:         0.90 cm   LV E/e' medial:  21.3 LV IVS:        1.00 cm   LV e' lateral:   8.92 cm/s LVOT diam:     1.90 cm   LV E/e' lateral: 17.2 LVOT Area:     2.84 cm  LEFT ATRIUM             Index LA diam:        4.60 cm 2.95 cm/m LA Vol (A2C):   73.8 ml 47.27 ml/m LA Vol (A4C):   55.5 ml 35.55 ml/m LA Biplane Vol: 67.4 ml 43.17 ml/m   AORTA Ao Root diam: 2.80 cm MITRAL VALVE MV Area (PHT): 3.60 cm       SHUNTS MV Peak grad:  14.3 mmHg      Systemic Diam: 1.90 cm MV Mean grad:  4.0 mmHg MV Vmax:       1.89 m/s MV Vmean:      83.9 cm/s MV Decel Time: 211 msec MR Peak grad:    104.0 mmHg MR Mean grad:    66.0 mmHg MR Vmax:  510.00 cm/s MR Vmean:        376.5 cm/s MR PISA:         1.79 cm MR PISA Eff ROA: 32 mm MR PISA Radius:  0.53 cm MV E velocity: 153.00 cm/s MV A velocity: 88.00 cm/s MV E/A ratio:  1.74 Julien Nordmann MD Electronically signed by Julien Nordmann MD Signature Date/Time: May 01, 2023/4:30:20 PM    Final    US RENAL  Result Date: 04/01/2023 CLINICAL DATA:  Acute kidney failure EXAM: RENAL / URINARY TRACT ULTRASOUND COMPLETE COMPARISON:  02/21/2020 FINDINGS: Right Kidney: Renal measurements: 10.4 x 4.6 x 4.5 cm = volume: 111.4 mL. No collecting system dilatation or perinephric fluid. Right-sided simple appearing renal cysts identified measuring up to 11 mm centrally. Not as well seen on the prior renal ultrasound. Left Kidney: Renal measurements: 10.1 x 4.6 x 4.3 cm = volume: 102.6 mL. No collecting system dilatation or perinephric fluid. Left-sided renal cysts are identified which appears simple. Largest towards the upper pole measures 2.4 cm. Previously this measured 3.3 cm. Bladder: Foley catheter in place.  Underdistended bladder. Other: Pleural effusion incidentally seen on the right.  IMPRESSION: No collecting system dilatation. Bilateral renal cysts. Right-sided pleural effusion. Electronically Signed   By: Karen Kays M.D.   On: 04/01/2023 17:39    Cardiac Studies   Limited echo yesterday 01-May-2023  1. Left ventricular ejection fraction, by estimation, is 35 to 40%. The  left ventricle has moderately decreased function. The left ventricle  demonstrates global hypokinesis. Left ventricular diastolic parameters are  indeterminate.   2. Right ventricular systolic function is normal. The right ventricular  size is normal. Tricuspid regurgitation signal is inadequate for assessing  PA pressure.   3. Left atrial size was moderately dilated.   4. Moderate pleural effusion in the left lateral region.   5. The mitral valve is normal in structure. Moderate to severe mitral  valve regurgitation. No evidence of mitral stenosis.   6. The aortic valve is normal in structure. Aortic valve regurgitation is  not visualized. No aortic stenosis is present.   7. The inferior vena cava is normal in size with <50% respiratory  variability, suggesting right atrial pressure of 8 mmHg.   Patient Profile     [name] is a 88 y.o.  female with diabetes mellitus type II, hypertension, coronary artery disease, peripheral arterial disease, tremor, CVA, congestive heart failure and anemia who is admitted to [facility] on 03/25/2023 for COPD exacerbation (HCC) [J44.1] Chest pain [R07.9] Community acquired pneumonia, unspecified laterality [J18.9] Acute diastolic and systolic CHF  Assessment & Plan     1. Acute systolic HF -> cardiogenic shock - Echo 2023 EF 55-60% - Echo this admit read as EF 30-35%, concern for ischemic cardiomyopathy Peak troponin 1100 Cardiac catheterization deferred secondary to renal dysfunction creatinine of 2  Initial co-ox 44% CVP 21 (03/29/23) Treated with milrinone infusion, Co. ox yesterday 65 down from 75 - CVP 6-7 - Not candidate for ACE/ARB/ARNI/MRA  with kidney injury  Defer SGLT2 inhibitor in the setting of urine incontinence, high risk for yeast infection, discussed with nephrology Started on hydralazine 25 3 times daily, nitrates/isosorbide 30 daily Start Coreg 3.125 twice daily Torsemide 40 daily   2. NSTEMI - continue ASA/statin Drop in ejection fraction concerning for ischemic cardiomyopathy, no catheterization pending in the setting of renal dysfunction Will start low-dose beta-blocker Coreg 3.25 twice daily   3. AKI on CKD 3a - likely ATN/cardiorenal  - Scr  1.5 -> 2.8 -> 2.7 -> 2.4 -> 2.1 -> 2.3 - seems to be plateuaing. Can stop milrinone  - renal u/s 3/21 with medico-renal disease.  On torsemide 40 daily   4. Acute hypoxic respiratory failure COPD, heart failure, pleural effusions contributing - s/p R thora on 6/3 with 700cc out  Respiratory status improving Need to mobilize out of bed   5. PAF  Maintaining normal sinus rhythm Amiodarone 200 twice daily, Eliquis 2.5 twice daily Low-dose carvedilol 3.125 twice daily   6. Hypervolemic hyponatremia Restrict free water, continue torsemide   7. Urinary retention Foley has been removed   8. DNR/DNI/no dialysis - confirmed with patient and her daughter   No family at the bedside     Length of Stay: 9   For questions or updates, please contact [facility] Please consult [email or web address] for contact info under        Signed, Julien Nordmann, MD  04/03/2023, 11:55 AM

## 2023-04-03 NOTE — Progress Notes (Signed)
Progress Note   Patient: Jill Hudson ZOX:[id number] DOB: 1935-08-07 DOA: 03/25/2023     9 DOS: the patient was seen and examined on 04/03/2023   Brief hospital course: 88 year old female with history of COPD, HTN, HLD, DM2, stroke, TIA, GERD, anxiety, PVD, CAD, MI, former tobacco use, CKD stage III, GI bleed, iron deficiency anemia, PAD status post PCI comes to the hospital with shortness of breath and hypoxia. Patient was found to have bilateral pulmonary edema /effusion with elevated BNP.  Patient is admitted for further evaluation.  Cardiology is consulted.  Patient will eventually need right and left heart cath pending improvement in renal functions.   6/5.  Patient feeling a little bit better than when she came in.  On heparin drip, milrinone drip, Lasix drip for cardiogenic shock.  Amiodarone drip added for atrial fibrillation. 6/6.  Now on torsemide instead of Lasix drip.  Creatinine down to 2.16 6/7.  Cardiology decided to discontinue milrinone drip.  Switching off amiodarone drip.  Converting heparin drip over to Eliquis.  Creatinine up to 2.31.  Patient in normal sinus rhythm. 6/8.  Transfer to progressive care.  Creatinine up to 2.5 and potassium 5.3.  Lokelma ordered.  Potassium supplementation discontinued  Assessment and Plan: * Cardiogenic shock [facility]) Cardiology took off milrinone drip 6/7.  Not on any pressors at this point.  Acute on chronic systolic CHF (congestive heart failure) (HCC) Patient on torsemide, hydralazine, Imdur.  No ACE/ARB or spironolactone with her kidney function.  Paroxysmal atrial fibrillation (HCC) Patient converted to normal sinus rhythm.  Taken off amiodarone drip and switched to oral on 6/7.  Heparin drip converted over to Eliquis on 6/7.  Acute kidney injury superimposed on CKD (HCC) AKI on CKD stage IIIa.  Creatinine was as high as 2.89.  Last creatinine 2.50.  NSTEMI (non-ST elevated myocardial infarction) (HCC) With creatinine above 2, medical  management.  Continue aspirin, Imdur and low-dose Crestor.  Cardiology started low-dose Coreg.  Type II diabetes mellitus with renal manifestations (HCC) Last hemoglobin A1c 7.9, on 15 units of glargine insulin with sliding scale  COPD exacerbation (HCC) Continue nebulizers.  Essential hypertension Continue hydralazine and Imdur.  Low-dose Coreg started.  History of CVA (cerebrovascular accident) Patient on Eliquis for stroke prevention  HLD (hyperlipidemia) On Crestor  Hyponatremia Sodium 129.  Iron deficiency anemia Last hemoglobin 8.6.  Continue to monitor.  Hyperkalemia Potassium 5.3.  Discontinued potassium supplementation and Lokelma ordered.  Pleural effusion Right thoracentesis removed 700 mL of fluid.  This was done by interventional radiology on 6/3.  Acute respiratory failure with hypoxia (HCC) Patient initially requiring BiPAP secondary to respiratory distress.  Currently on 2 L.        Subjective: Patient feels okay.  Last night had some shortness of breath but doing a little bit better now.  Admitted with heart failure exacerbation.  Physical Exam: Vitals:   04/03/23 0750 04/03/23 0800 04/03/23 1000 04/03/23 1200  BP:  133/62 124/61 (!) 127/45  Pulse:  68 77 67  Resp:  18 (!) 22 17  Temp: 97.7 F (36.5 C)   98.2 F (36.8 C)  TempSrc: Oral   Oral  SpO2:  94% 97% 98%  Weight:      Height:       Physical Exam HENT:     Head: Normocephalic.     Mouth/Throat:     Pharynx: No oropharyngeal exudate.  Eyes:     General: Lids are normal.     Conjunctiva/sclera: Conjunctivae  normal.  Cardiovascular:     Rate and Rhythm: Normal rate and regular rhythm.     Heart sounds: Normal heart sounds, S1 normal and S2 normal.  Pulmonary:     Breath sounds: Examination of the right-lower field reveals decreased breath sounds. Examination of the left-lower field reveals decreased breath sounds. Decreased breath sounds present. No wheezing, rhonchi or rales.   Abdominal:     Palpations: Abdomen is soft.     Tenderness: There is no abdominal tenderness.  Musculoskeletal:     Right lower leg: No swelling.     Left lower leg: No swelling.  Skin:    General: Skin is warm.     Findings: No rash.  Neurological:     Mental Status: She is alert and oriented to person, place, and time.     Data Reviewed: Sodium 129, potassium 5.3, creatinine 2.5, white blood cell count 11.7, hemoglobin 8.6, platelet count 204  Family Communication: Updated patient's daughter on the phone  Disposition: Status is: Inpatient Remains inpatient appropriate because: We will transfer out of ICU to progressive care, treat hyperkalemia.  Planned Discharge Destination: Rehab    Time spent: 28 minutes Case discussed with nephrology Author: Alford Highland, MD 04/03/2023 12:51 PM  For on call review [email or web address].

## 2023-04-03 NOTE — Progress Notes (Signed)
Per dr. Mariah Milling recheck co ox lab in the morning at 0700 if total oxygen content stays in the 40-50 may need to restart on milrinone drip.

## 2023-04-03 NOTE — Progress Notes (Signed)
[facility]  ROUNDING NOTE   Subjective:   [name] was admitted to [facility] on 03/25/2023 for COPD exacerbation [facility]) [J44.1] Chest pain [R07.9] Community acquired pneumonia, unspecified laterality [J18.9]  Off milrinone  States that she feels better compared to yesterday. Foley catheter has been removed. Urine output recorded at 300 cc Serum creatinine is higher today Potassium is also higher at 5.3.  Objective:  Vital signs in last 24 hours:  Temp:  [97.7 F (36.5 C)-98.4 F (36.9 C)] 97.7 F (36.5 C) (06/08 0750) Pulse Rate:  [61-95] 63 (06/08 0500) Resp:  [12-24] 13 (06/08 0500) BP: (105-144)/(42-100) 111/44 (06/08 0544) SpO2:  [92 %-98 %] 97 % (06/08 0500) Weight:  [59.5 kg] 59.5 kg (06/08 0718)  Weight change:  Filed Weights   04/01/23 0152 04/02/23 0802 04/03/23 0718  Weight: 59.7 kg 59.6 kg 59.5 kg    Intake/Output: I/O last 3 completed shifts: In: 553.6 [I.V.:553.6] Out: 800 [Urine:800]   Intake/Output this shift:  No intake/output data recorded.  Physical Exam: General: NAD, sitting in bed  Head: Normocephalic, atraumatic. Moist oral mucosal membranes  Eyes: Anicteric  Lungs:  Mild Basilar crackles   Heart: regular  Abdomen:  Soft, nontender,   Extremities:  no peripheral edema.  Neurologic: Nonfocal, moving all four extremities  Skin: No lesions        Basic Metabolic Panel: Recent Labs  Lab 03/28/23 0531 03/29/23 0412 03/30/23 0345 03/30/23 1423 03/31/23 0446 03/31/23 1446 04/01/23 0503 04/02/23 0406 04/03/23 0505  NA 125*   < > 127*   < > 130* 131* 132* 130* 129*  K 3.9   < > 3.6   < > 3.0* 3.8 3.6 4.9 5.3*  CL 94*   < > 96*   < > 98 97* 99 99 97*  CO2 18*   < > 21*   < > 22 23 22 22  21*  GLUCOSE 218*   < > 224*   < > 125* 286* 114* 179* 94  BUN 76*   < > 92*   < > 79* 84* 77* 72* 69*  CREATININE 2.84*   < > 2.69*   < > 2.42* 2.22* 2.16* 2.31* 2.50*  CALCIUM 7.9*   < > 7.5*   < > 7.9* 8.1* 8.0* 8.1* 8.5*  MG  2.1  --  2.2  --  2.2  --  2.0 1.9 2.1  PHOS 5.9*  --   --   --   --   --   --   --   --    < > = values in this interval not displayed.     Liver Function Tests: Recent Labs  Lab 03/29/23 1612  PROT 5.3*    No results for input(s): "LIPASE", "AMYLASE" in the last 168 hours. No results for input(s): "AMMONIA" in the last 168 hours.  CBC: Recent Labs  Lab 03/29/23 2315 03/31/23 0446 04/01/23 0503 04/02/23 0406 04/03/23 0505  WBC 11.9* 10.2 11.0* 14.3* 11.7*  HGB 9.3* 8.5* 8.1* 8.3* 8.6*  HCT 27.8* 25.3* 25.1* 26.2* 26.4*  MCV 86.6 86.9 88.1 88.8 88.9  PLT 228 196 205 215 204     Cardiac Enzymes: No results for input(s): "CKTOTAL", "CKMB", "CKMBINDEX", "TROPONINI" in the last 168 hours.  BNP: Invalid input(s): "POCBNP"  CBG: Recent Labs  Lab 04/02/23 0744 04/02/23 1121 04/02/23 1530 04/02/23 2201 04/03/23 0746  GLUCAP 136* 173* 140* 129* 83     Microbiology: Results for orders placed or  performed during the hospital encounter of 03/25/23  Blood Culture (routine x 2)     Status: None   Collection Time: 03/25/23 12:09 PM   Specimen: BLOOD  Result Value Ref Range Status   Specimen Description BLOOD LEFT [facility]  Final   Special Requests   Final    BOTTLES DRAWN AEROBIC AND ANAEROBIC Blood Culture adequate volume   Culture   Final    NO GROWTH 5 DAYS Performed at [facility], [address] [place], [place] [address]    Report Status 03/30/2023 FINAL  Final  Blood Culture (routine x 2)     Status: None   Collection Time: 03/25/23 12:10 PM   Specimen: BLOOD  Result Value Ref Range Status   Specimen Description BLOOD LEFT FA  Final   Special Requests   Final    BOTTLES DRAWN AEROBIC AND ANAEROBIC Blood Culture adequate volume   Culture   Final    NO GROWTH 5 DAYS Performed at [facility], [address], [place], [place] [address]    Report Status 03/30/2023 FINAL  Final  SARS Coronavirus 2 by RT PCR (hospital order, performed in  [facility] hospital lab) *cepheid single result test* Anterior Nasal Swab     Status: None   Collection Time: 03/25/23  1:18 PM   Specimen: Anterior Nasal Swab  Result Value Ref Range Status   SARS Coronavirus 2 by RT PCR NEGATIVE NEGATIVE Final    Comment: (NOTE) SARS-CoV-2 target nucleic acids are NOT DETECTED.  The SARS-CoV-2 RNA is generally detectable in upper and lower respiratory specimens during the acute phase of infection. The lowest concentration of SARS-CoV-2 viral copies this assay can detect is 250 copies / mL. A negative result does not preclude SARS-CoV-2 infection and should not be used as the sole basis for treatment or other patient management decisions.  A negative result may occur with improper specimen collection / handling, submission of specimen other than nasopharyngeal swab, presence of viral mutation(s) within the areas targeted by this assay, and inadequate number of viral copies (<250 copies / mL). A negative result must be combined with clinical observations, patient history, and epidemiological information.  Fact Sheet for Patients:   RoadLapTop.co.za  Fact Sheet for Healthcare Providers: http://kim-miller.com/  This test is not yet approved or  cleared by the [place] FDA and has been authorized for detection and/or diagnosis of SARS-CoV-2 by FDA under an Emergency Use Authorization (EUA).  This EUA will remain in effect (meaning this test can be used) for the duration of the COVID-19 declaration under Section 564(b)(1) of the Act, 21 U.S.C. section 360bbb-3(b)(1), unless the authorization is terminated or revoked sooner.  Performed at [facility], [address] [place], [place] [address]   MRSA Next Gen by PCR, Nasal     Status: None   Collection Time: 03/27/23  4:40 AM   Specimen: Nasal Mucosa; Nasal Swab  Result Value Ref Range Status   MRSA by PCR Next Gen NOT DETECTED NOT  DETECTED Final    Comment: (NOTE) The GeneXpert MRSA Assay (FDA approved for NASAL specimens only), is one component of a comprehensive MRSA colonization surveillance program. It is not intended to diagnose MRSA infection nor to guide or monitor treatment for MRSA infections. Test performance is not FDA approved in patients less than 17 years old. Performed at [facility], [address], [place], [place] [address]   Respiratory (~20 pathogens) panel by PCR  Status: None   Collection Time: 03/28/23  4:00 PM   Specimen: Nasopharyngeal Swab; Respiratory  Result Value Ref Range Status   Adenovirus NOT DETECTED NOT DETECTED Final   Coronavirus 229E NOT DETECTED NOT DETECTED Final    Comment: (NOTE) The Coronavirus on the Respiratory Panel, DOES NOT test for the novel  Coronavirus (2019 nCoV)    Coronavirus HKU1 NOT DETECTED NOT DETECTED Final   Coronavirus NL63 NOT DETECTED NOT DETECTED Final   Coronavirus OC43 NOT DETECTED NOT DETECTED Final   Metapneumovirus NOT DETECTED NOT DETECTED Final   Rhinovirus / Enterovirus NOT DETECTED NOT DETECTED Final   Influenza A NOT DETECTED NOT DETECTED Final   Influenza B NOT DETECTED NOT DETECTED Final   Parainfluenza Virus 1 NOT DETECTED NOT DETECTED Final   Parainfluenza Virus 2 NOT DETECTED NOT DETECTED Final   Parainfluenza Virus 3 NOT DETECTED NOT DETECTED Final   Parainfluenza Virus 4 NOT DETECTED NOT DETECTED Final   Respiratory Syncytial Virus NOT DETECTED NOT DETECTED Final   Bordetella pertussis NOT DETECTED NOT DETECTED Final   Bordetella Parapertussis NOT DETECTED NOT DETECTED Final   Chlamydophila pneumoniae NOT DETECTED NOT DETECTED Final   Mycoplasma pneumoniae NOT DETECTED NOT DETECTED Final    Comment: Performed at [facility], [address] [place], [place] [address]  Body fluid culture w Gram Stain     Status: None   Collection Time: 03/29/23 10:27 AM   Specimen: PATH Cytology Pleural fluid  Result  Value Ref Range Status   Specimen Description   Final    PLEURAL Performed at [facility], [address], [place], [place] [address]    Special Requests   Final    NONE Performed at [facility], [address] [place], [place] [address]    Gram Stain NO WBC SEEN NO ORGANISMS SEEN   Final   Culture   Final    NO GROWTH 3 DAYS Performed at [facility], [address] [place], [place] [address]    Report Status 04/01/2023 FINAL  Final  MRSA Next Gen by PCR, Nasal     Status: None   Collection Time: 03/29/23  3:00 PM   Specimen: Nasal Mucosa; Nasal Swab  Result Value Ref Range Status   MRSA by PCR Next Gen NOT DETECTED NOT DETECTED Final    Comment: (NOTE) The GeneXpert MRSA Assay (FDA approved for NASAL specimens only), is one component of a comprehensive MRSA colonization surveillance program. It is not intended to diagnose MRSA infection nor to guide or monitor treatment for MRSA infections. Test performance is not FDA approved in patients less than 43 years old. Performed at [facility], [address] [place], [place] [address]   Group A Strep by PCR     Status: None   Collection Time: 03/30/23  6:00 PM   Specimen: Throat; Sterile Swab  Result Value Ref Range Status   Group A Strep by PCR NOT DETECTED NOT DETECTED Final    Comment: Performed at [facility], [address] [place], [place] [address]    Coagulation Studies: No results for input(s): "LABPROT", "INR" in the last 72 hours.   Urinalysis: No results for input(s): "COLORURINE", "LABSPEC", "PHURINE", "GLUCOSEU", "HGBUR", "BILIRUBINUR", "KETONESUR", "PROTEINUR", "UROBILINOGEN", "NITRITE", "LEUKOCYTESUR" in the last 72 hours.  Invalid input(s): "APPERANCEUR"    Imaging: DG Chest Port 1 View  Result Date: 04/02/2023 CLINICAL DATA:  Congestive heart failure. EXAM: PORTABLE CHEST 1 VIEW COMPARISON:  04/01/2023 FINDINGS: Stable heart size. Stable positioning  of left jugular central line with the catheter tip at the SVC/RA junction. Congestive heart failure slightly increased bilaterally. Increase in bilateral pleural effusions with likely slightly greater volume on the left compared to the right. No pneumothorax. IMPRESSION: Increase in congestive heart failure and bilateral pleural effusions. Electronically Signed   By: Irish Lack M.D.   On: 04/02/2023 17:21   ECHOCARDIOGRAM LIMITED  Result Date: 04/02/2023    ECHOCARDIOGRAM LIMITED REPORT   Patient Name:   HARLEYQUINN ECKELKAMP Date of Exam: 04/02/2023 Medical Rec #:  [id number]     Height:       60.0 in Accession #:    [id number]    Weight:       131.4 lb Date of Birth:  18-Jan-1935      BSA:          1.561 m Patient Age:    88 years      BP:           118/57 mmHg Patient Gender: F             HR:           70 bpm. Exam Location:  [facility] Procedure: Limited Echo, Cardiac Doppler and Color Doppler Indications:     Mitral valve disorder  History:         Patient has prior history of Echocardiogram examinations, most                  recent 03/26/2023. CHF, CAD and Acute MI, PAD, COPD and Stroke;                  Risk Factors:Hypertension, Diabetes and Dyslipidemia. CKD.  Sonographer:     Mikki Harbor Referring Phys:  [name] Diagnosing Phys: Julien Nordmann MD IMPRESSIONS  1. Left ventricular ejection fraction, by estimation, is 35 to 40%. The left ventricle has moderately decreased function. The left ventricle demonstrates global hypokinesis. Left ventricular diastolic parameters are indeterminate.  2. Right ventricular systolic function is normal. The right ventricular size is normal. Tricuspid regurgitation signal is inadequate for assessing PA pressure.  3. Left atrial size was moderately dilated.  4. Moderate pleural effusion in the left lateral region.  5. The mitral valve is normal in structure. Moderate to severe mitral valve regurgitation. No evidence of mitral stenosis.  6. The aortic valve is  normal in structure. Aortic valve regurgitation is not visualized. No aortic stenosis is present.  7. The inferior vena cava is normal in size with <50% respiratory variability, suggesting right atrial pressure of 8 mmHg. FINDINGS  Left Ventricle: Left ventricular ejection fraction, by estimation, is 35 to 40%. The left ventricle has moderately decreased function. The left ventricle demonstrates global hypokinesis. The left ventricular internal cavity size was normal in size. There is no left ventricular hypertrophy. Left ventricular diastolic parameters are indeterminate. Right Ventricle: The right ventricular size is normal. No increase in right ventricular wall thickness. Right ventricular systolic function is normal. Tricuspid regurgitation signal is inadequate for assessing PA pressure. Left Atrium: Left atrial size was moderately dilated. Right Atrium: Right atrial size was normal in size. Pericardium: There is no evidence of pericardial effusion. Mitral Valve: The mitral valve is normal in structure. Moderate to severe mitral valve regurgitation, with eccentric posteriorly directed jet. No evidence of mitral valve stenosis. MV peak gradient, 14.3 mmHg. The mean mitral valve gradient is 4.0 mmHg. Tricuspid Valve: The tricuspid valve  is normal in structure. Tricuspid valve regurgitation is not demonstrated. No evidence of tricuspid stenosis. Aortic Valve: The aortic valve is normal in structure. Aortic valve regurgitation is not visualized. No aortic stenosis is present. Pulmonic Valve: The pulmonic valve was normal in structure. Pulmonic valve regurgitation is not visualized. No evidence of pulmonic stenosis. Aorta: The aortic root is normal in size and structure. Venous: The inferior vena cava is normal in size with less than 50% respiratory variability, suggesting right atrial pressure of 8 mmHg. IAS/Shunts: No atrial level shunt detected by color flow Doppler. Additional Comments: There is a moderate  pleural effusion in the left lateral region.  LEFT VENTRICLE PLAX 2D LVIDd:         5.10 cm   Diastology LVIDs:         3.50 cm   LV e' medial:    7.18 cm/s LV PW:         0.90 cm   LV E/e' medial:  21.3 LV IVS:        1.00 cm   LV e' lateral:   8.92 cm/s LVOT diam:     1.90 cm   LV E/e' lateral: 17.2 LVOT Area:     2.84 cm  LEFT ATRIUM             Index LA diam:        4.60 cm 2.95 cm/m LA Vol (A2C):   73.8 ml 47.27 ml/m LA Vol (A4C):   55.5 ml 35.55 ml/m LA Biplane Vol: 67.4 ml 43.17 ml/m   AORTA Ao Root diam: 2.80 cm MITRAL VALVE MV Area (PHT): 3.60 cm       SHUNTS MV Peak grad:  14.3 mmHg      Systemic Diam: 1.90 cm MV Mean grad:  4.0 mmHg MV Vmax:       1.89 m/s MV Vmean:      83.9 cm/s MV Decel Time: 211 msec MR Peak grad:    104.0 mmHg MR Mean grad:    66.0 mmHg MR Vmax:         510.00 cm/s MR Vmean:        376.5 cm/s MR PISA:         1.79 cm MR PISA Eff ROA: 32 mm MR PISA Radius:  0.53 cm MV E velocity: 153.00 cm/s MV A velocity: 88.00 cm/s MV E/A ratio:  1.74 Julien Nordmann MD Electronically signed by Julien Nordmann MD Signature Date/Time: 04/02/2023/4:30:20 PM    Final    US RENAL  Result Date: 04/01/2023 CLINICAL DATA:  Acute kidney failure EXAM: RENAL / URINARY TRACT ULTRASOUND COMPLETE COMPARISON:  02/21/2020 FINDINGS: Right Kidney: Renal measurements: 10.4 x 4.6 x 4.5 cm = volume: 111.4 mL. No collecting system dilatation or perinephric fluid. Right-sided simple appearing renal cysts identified measuring up to 11 mm centrally. Not as well seen on the prior renal ultrasound. Left Kidney: Renal measurements: 10.1 x 4.6 x 4.3 cm = volume: 102.6 mL. No collecting system dilatation or perinephric fluid. Left-sided renal cysts are identified which appears simple. Largest towards the upper pole measures 2.4 cm. Previously this measured 3.3 cm. Bladder: Foley catheter in place.  Underdistended bladder. Other: Pleural effusion incidentally seen on the right. IMPRESSION: No collecting system dilatation.  Bilateral renal cysts. Right-sided pleural effusion. Electronically Signed   By: Karen Kays M.D.   On: 04/01/2023 17:39     Medications:      amiodarone  200 mg Oral BID   apixaban  2.5 mg Oral BID   arformoterol  15 mcg Nebulization BID   aspirin EC  81 mg Oral Daily   Chlorhexidine Gluconate Cloth  6 each Topical Daily   cyanocobalamin  1,000 mcg Oral Daily   feeding supplement (GLUCERNA SHAKE)  237 mL Oral TID BM   gabapentin  100 mg Oral QHS   hydrALAZINE  25 mg Oral Q8H   insulin aspart  0-15 Units Subcutaneous TID WC   insulin aspart  0-5 Units Subcutaneous QHS   insulin glargine-yfgn  15 Units Subcutaneous QHS   isosorbide mononitrate  30 mg Oral Daily   multivitamin  1 tablet Oral BID   polyethylene glycol  17 g Oral Daily   promethazine  6.25 mg Oral QHS   revefenacin  175 mcg Nebulization Daily   rosuvastatin  10 mg Oral Once per day on Mon Wed Fri Sat   torsemide  40 mg Oral Daily   acetaminophen, alum & mag hydroxide-simeth, dextromethorphan-guaiFENesin, hydrALAZINE, levalbuterol, melatonin, ondansetron (ZOFRAN) IV, mouth rinse, oxyCODONE, phenol, senna-docusate, traZODone  Assessment/ Plan:  Ms. BIANNCA SHIRAISHI is a 88 y.o.  female with diabetes mellitus type II, hypertension, coronary artery disease, peripheral arterial disease, tremor, CVA, congestive heart failure and anemia who is admitted to [facility] on 03/25/2023 for COPD exacerbation (HCC) [J44.1] Chest pain [R07.9] Community acquired pneumonia, unspecified laterality [J18.9]  Acute kidney injury with acute metabolic acidosis on chronic kidney disease stage IIIB: baseline creatinine of 1.48, GFR of 34 on 12/09/2022. Acute kidney injury secondary to acute cardiorenal syndrome . Chronic kidney disease secondary to hypertension.  Serum creatinine worse today.  Maintain hemodynamic stability. Serial bladder scans. Avoid hypotension/nonsteroidals/IV contrast Electrolytes and volume status are acceptable.  No acute  indication for dialysis   Hypertension with chronic kidney disease: with chronic systolic and diastolic congestive heart failure.  Appreciate cardiology input.  Heart failure regimen includes isosorbide/hydralazine Patient has incontinence occasionally.  Hesitant to start SGLT2 inhibitor.  Hyponatremia: -Sodium slowly improving with volume status.  Sodium level has ranged between 1 29-1 32.  Continue to monitor closely.  4.  Hyperkalemia Likely secondary to AKI. Will start veltassa   LOS: 9 Yurem Viner 6/8/20248:04 AM

## 2023-04-04 DIAGNOSIS — E875 Hyperkalemia: Secondary | ICD-10-CM | POA: Diagnosis not present

## 2023-04-04 DIAGNOSIS — N179 Acute kidney failure, unspecified: Secondary | ICD-10-CM | POA: Diagnosis not present

## 2023-04-04 DIAGNOSIS — I25118 Atherosclerotic heart disease of native coronary artery with other forms of angina pectoris: Secondary | ICD-10-CM

## 2023-04-04 DIAGNOSIS — E871 Hypo-osmolality and hyponatremia: Secondary | ICD-10-CM | POA: Diagnosis not present

## 2023-04-04 DIAGNOSIS — I5023 Acute on chronic systolic (congestive) heart failure: Secondary | ICD-10-CM | POA: Diagnosis not present

## 2023-04-04 DIAGNOSIS — I739 Peripheral vascular disease, unspecified: Secondary | ICD-10-CM | POA: Diagnosis not present

## 2023-04-04 DIAGNOSIS — J441 Chronic obstructive pulmonary disease with (acute) exacerbation: Secondary | ICD-10-CM | POA: Diagnosis not present

## 2023-04-04 DIAGNOSIS — Z8673 Personal history of transient ischemic attack (TIA), and cerebral infarction without residual deficits: Secondary | ICD-10-CM | POA: Diagnosis not present

## 2023-04-04 DIAGNOSIS — R338 Other retention of urine: Secondary | ICD-10-CM

## 2023-04-04 DIAGNOSIS — I214 Non-ST elevation (NSTEMI) myocardial infarction: Secondary | ICD-10-CM | POA: Diagnosis not present

## 2023-04-04 DIAGNOSIS — I10 Essential (primary) hypertension: Secondary | ICD-10-CM | POA: Diagnosis not present

## 2023-04-04 DIAGNOSIS — I480 Paroxysmal atrial fibrillation: Secondary | ICD-10-CM | POA: Diagnosis not present

## 2023-04-04 DIAGNOSIS — J189 Pneumonia, unspecified organism: Secondary | ICD-10-CM | POA: Diagnosis not present

## 2023-04-04 DIAGNOSIS — E1121 Type 2 diabetes mellitus with diabetic nephropathy: Secondary | ICD-10-CM | POA: Diagnosis not present

## 2023-04-04 DIAGNOSIS — R570 Cardiogenic shock: Secondary | ICD-10-CM | POA: Diagnosis not present

## 2023-04-04 DIAGNOSIS — J90 Pleural effusion, not elsewhere classified: Secondary | ICD-10-CM | POA: Diagnosis not present

## 2023-04-04 LAB — GLUCOSE, CAPILLARY
Glucose-Capillary: 132 mg/dL — ABNORMAL HIGH (ref 70–99)
Glucose-Capillary: 57 mg/dL — ABNORMAL LOW (ref 70–99)
Glucose-Capillary: 116 mg/dL — ABNORMAL HIGH (ref 70–99)
Glucose-Capillary: 159 mg/dL — ABNORMAL HIGH (ref 70–99)
Glucose-Capillary: 130 mg/dL — ABNORMAL HIGH (ref 70–99)

## 2023-04-04 LAB — CBC
HCT: 26 % — ABNORMAL LOW (ref 36.0–46.0)
Hemoglobin: 8.2 g/dL — ABNORMAL LOW (ref 12.0–15.0)
MCH: 28.6 pg (ref 26.0–34.0)
MCHC: 31.5 g/dL (ref 30.0–36.0)
MCV: 90.6 fL (ref 80.0–100.0)
Platelets: 201 10*3/uL (ref 150–400)
RBC: 2.87 MIL/uL — ABNORMAL LOW (ref 3.87–5.11)
RDW: 13.8 % (ref 11.5–15.5)
WBC: 11.7 10*3/uL — ABNORMAL HIGH (ref 4.0–10.5)
nRBC: 0 % (ref 0.0–0.2)

## 2023-04-04 LAB — BASIC METABOLIC PANEL
Anion gap: 8 (ref 5–15)
BUN: 70 mg/dL — ABNORMAL HIGH (ref 8–23)
CO2: 23 mmol/L (ref 22–32)
Calcium: 8.6 mg/dL — ABNORMAL LOW (ref 8.9–10.3)
Chloride: 100 mmol/L (ref 98–111)
Creatinine, Ser: 2.63 mg/dL — ABNORMAL HIGH (ref 0.44–1.00)
GFR, Estimated: 17 mL/min — ABNORMAL LOW (ref >60)
Glucose, Bld: 68 mg/dL — ABNORMAL LOW (ref 70–99)
Potassium: 4.8 mmol/L (ref 3.5–5.1)
Sodium: 131 mmol/L — ABNORMAL LOW (ref 135–145)

## 2023-04-04 LAB — COOXEMETRY PANEL
Carboxyhemoglobin: 1.8 % — ABNORMAL HIGH (ref 0.5–1.5)
Methemoglobin: 1.1 % (ref 0.0–1.5)
O2 Saturation: 87 %
Total hemoglobin: 8.6 g/dL — ABNORMAL LOW (ref 12.0–16.0)
Total oxygen content: 84.5 %

## 2023-04-04 MED ORDER — INSULIN GLARGINE-YFGN 100 UNIT/ML ~~LOC~~ SOLN
8.0000 [IU] | Freq: Every day | SUBCUTANEOUS | Status: DC
  Administered 2023-04-04 – 2023-04-19 (×16): 8 [IU] via SUBCUTANEOUS
  Filled 2023-04-04 (×17): qty 0.08

## 2023-04-04 MED ORDER — DEXTROSE 50 % IV SOLN
INTRAVENOUS | Status: AC
  Filled 2023-04-04: qty 50

## 2023-04-04 MED ORDER — DEXTROSE 50 % IV SOLN
12.5000 g | INTRAVENOUS | Status: AC
  Administered 2023-04-04: 12.5 g via INTRAVENOUS

## 2023-04-04 MED ORDER — GLUCOSE 40 % PO GEL
1.0000 | ORAL | Status: DC
  Filled 2023-04-04: qty 1

## 2023-04-04 MED ORDER — NYSTATIN 100000 UNIT/GM EX POWD
Freq: Three times a day (TID) | CUTANEOUS | Status: DC
  Administered 2023-04-07 – 2023-04-16 (×2): 1 via TOPICAL
  Filled 2023-04-04 (×4): qty 15

## 2023-04-04 NOTE — Progress Notes (Signed)
Rounding Note    Patient Name: Jill Hudson Date of Encounter: 04/04/2023  [facility] Cardiologist: [facility]-advanced heart failure  Subjective   Sitting up in bed, completed breakfast, reports feeling better today than yesterday Off milrinone past 2 days Creatinine 2.5 with trend up in potassium greater than 5 CVP  9 Co. ox trending down yesterday, up to 87 this morning  Inpatient Medications    Scheduled Meds:  amiodarone  200 mg Oral BID   apixaban  2.5 mg Oral BID   arformoterol  15 mcg Nebulization BID   aspirin EC  81 mg Oral Daily   carvedilol  3.125 mg Oral BID WC   Chlorhexidine Gluconate Cloth  6 each Topical Daily   cyanocobalamin  1,000 mcg Oral Daily   dextrose  1 Tube Oral STAT   feeding supplement (GLUCERNA SHAKE)  237 mL Oral TID BM   gabapentin  100 mg Oral QHS   hydrALAZINE  25 mg Oral Q8H   insulin aspart  0-15 Units Subcutaneous TID WC   insulin aspart  0-5 Units Subcutaneous QHS   insulin glargine-yfgn  15 Units Subcutaneous QHS   isosorbide mononitrate  30 mg Oral Daily   multivitamin  1 tablet Oral BID   nystatin   Topical TID   polyethylene glycol  17 g Oral Daily   promethazine  6.25 mg Oral QHS   revefenacin  175 mcg Nebulization Daily   rosuvastatin  10 mg Oral Once per day on Mon Wed Fri Sat   torsemide  40 mg Oral Daily   Continuous Infusions:  PRN Meds: acetaminophen, alum & mag hydroxide-simeth, dextromethorphan-guaiFENesin, hydrALAZINE, levalbuterol, melatonin, ondansetron (ZOFRAN) IV, mouth rinse, oxyCODONE, phenol, senna-docusate, traZODone   Vital Signs    Vitals:   04/04/23 0800 04/04/23 0830 04/04/23 0900 04/04/23 1000  BP: (!) 127/45 (!) 141/95 (!) 137/110 (!) 131/59  Pulse: 63 80 97 70  Resp: 15 18 (!) 31 (!) 23  Temp: 97.7 F (36.5 C)     TempSrc: Oral     SpO2: 96% 97% 100% 99%  Weight:      Height:        Intake/Output Summary (Last 24 hours) at 04/04/2023 1149 Last data filed at 04/04/2023 1027 Gross per  24 hour  Intake 180 ml  Output 1020 ml  Net -840 ml      04/03/2023    7:18 AM 04/02/2023    8:02 AM 04/01/2023    1:52 AM  Last 3 Weights  Weight (lbs) 131 lb 2.8 oz 131 lb 6.3 oz 131 lb 9.8 oz  Weight (kg) 59.5 kg 59.6 kg 59.7 kg      Telemetry    Normal sinus rhythm- Personally Reviewed  ECG     - Personally Reviewed  Physical Exam   Constitutional:  oriented to person, place, and time. No distress.  HENT:  Head: Grossly normal Eyes:  no discharge. No scleral icterus.  Neck: No JVD, no carotid bruits  Cardiovascular: Regular rate and rhythm, no murmurs appreciated Pulmonary/Chest: Clear to auscultation bilaterally, no wheezes or rails Abdominal: Soft.  no distension.  no tenderness.  Musculoskeletal: Normal range of motion Neurological:  normal muscle tone. Coordination normal. No atrophy Skin: Skin warm and dry Psychiatric: normal affect, pleasant   Labs    High Sensitivity Troponin:   Recent Labs  Lab 03/25/23 1857 03/25/23 2156 03/26/23 0444 03/26/23 1428 03/26/23 1711  TROPONINIHS 225* 458* 1,186* 1,165* 1,263*     Chemistry  Recent Labs  Lab 03/29/23 1612 03/29/23 2315 04/01/23 0503 04/02/23 0406 04/03/23 0505 04/04/23 0544  NA  --    < > 132* 130* 129* 131*  K  --    < > 3.6 4.9 5.3* 4.8  CL  --    < > 99 99 97* 100  CO2  --    < > 22 22 21* 23  GLUCOSE  --    < > 114* 179* 94 68*  BUN  --    < > 77* 72* 69* 70*  CREATININE  --    < > 2.16* 2.31* 2.50* 2.63*  CALCIUM  --    < > 8.0* 8.1* 8.5* 8.6*  MG  --    < > 2.0 1.9 2.1  --   PROT 5.3*  --   --   --   --   --   GFRNONAA  --    < > 22* 20* 18* 17*  ANIONGAP  --    < > 11 9 11 8    < > = values in this interval not displayed.    Lipids No results for input(s): "CHOL", "TRIG", "HDL", "LABVLDL", "LDLCALC", "CHOLHDL" in the last 168 hours.  Hematology Recent Labs  Lab 04/02/23 0406 04/03/23 0505 04/04/23 0544  WBC 14.3* 11.7* 11.7*  RBC 2.95* 2.97* 2.87*  HGB 8.3* 8.6* 8.2*  HCT  26.2* 26.4* 26.0*  MCV 88.8 88.9 90.6  MCH 28.1 29.0 28.6  MCHC 31.7 32.6 31.5  RDW 13.6 13.8 13.8  PLT 215 204 201   Thyroid No results for input(s): "TSH", "FREET4" in the last 168 hours.  BNPNo results for input(s): "BNP", "PROBNP" in the last 168 hours.  DDimer No results for input(s): "DDIMER" in the last 168 hours.   Radiology    DG Chest Port 1 View  Result Date: 04/02/2023 CLINICAL DATA:  Congestive heart failure. EXAM: PORTABLE CHEST 1 VIEW COMPARISON:  04/01/2023 FINDINGS: Stable heart size. Stable positioning of left jugular central line with the catheter tip at the SVC/RA junction. Congestive heart failure slightly increased bilaterally. Increase in bilateral pleural effusions with likely slightly greater volume on the left compared to the right. No pneumothorax. IMPRESSION: Increase in congestive heart failure and bilateral pleural effusions. Electronically Signed   By: Irish Lack M.D.   On: 04/02/2023 17:21   ECHOCARDIOGRAM LIMITED  Result Date: 04/02/2023    ECHOCARDIOGRAM LIMITED REPORT   Patient Name:   RHENA OLIVERSON Date of Exam: 04/02/2023 Medical Rec #:  [id number]     Height:       60.0 in Accession #:    [id number]    Weight:       131.4 lb Date of Birth:  04-06-1935      BSA:          1.561 m Patient Age:    88 years      BP:           118/57 mmHg Patient Gender: F             HR:           70 bpm. Exam Location:  [facility] Procedure: Limited Echo, Cardiac Doppler and Color Doppler Indications:     Mitral valve disorder  History:         Patient has prior history of Echocardiogram examinations, most                  recent 03/26/2023. CHF, CAD and  Acute MI, PAD, COPD and Stroke;                  Risk Factors:Hypertension, Diabetes and Dyslipidemia. CKD.  Sonographer:     Mikki Harbor Referring Phys:  [id number] [name] Diagnosing Phys: Julien Nordmann MD IMPRESSIONS  1. Left ventricular ejection fraction, by estimation, is 35 to 40%. The left ventricle has moderately  decreased function. The left ventricle demonstrates global hypokinesis. Left ventricular diastolic parameters are indeterminate.  2. Right ventricular systolic function is normal. The right ventricular size is normal. Tricuspid regurgitation signal is inadequate for assessing PA pressure.  3. Left atrial size was moderately dilated.  4. Moderate pleural effusion in the left lateral region.  5. The mitral valve is normal in structure. Moderate to severe mitral valve regurgitation. No evidence of mitral stenosis.  6. The aortic valve is normal in structure. Aortic valve regurgitation is not visualized. No aortic stenosis is present.  7. The inferior vena cava is normal in size with <50% respiratory variability, suggesting right atrial pressure of 8 mmHg. FINDINGS  Left Ventricle: Left ventricular ejection fraction, by estimation, is 35 to 40%. The left ventricle has moderately decreased function. The left ventricle demonstrates global hypokinesis. The left ventricular internal cavity size was normal in size. There is no left ventricular hypertrophy. Left ventricular diastolic parameters are indeterminate. Right Ventricle: The right ventricular size is normal. No increase in right ventricular wall thickness. Right ventricular systolic function is normal. Tricuspid regurgitation signal is inadequate for assessing PA pressure. Left Atrium: Left atrial size was moderately dilated. Right Atrium: Right atrial size was normal in size. Pericardium: There is no evidence of pericardial effusion. Mitral Valve: The mitral valve is normal in structure. Moderate to severe mitral valve regurgitation, with eccentric posteriorly directed jet. No evidence of mitral valve stenosis. MV peak gradient, 14.3 mmHg. The mean mitral valve gradient is 4.0 mmHg. Tricuspid Valve: The tricuspid valve is normal in structure. Tricuspid valve regurgitation is not demonstrated. No evidence of tricuspid stenosis. Aortic Valve: The aortic valve is  normal in structure. Aortic valve regurgitation is not visualized. No aortic stenosis is present. Pulmonic Valve: The pulmonic valve was normal in structure. Pulmonic valve regurgitation is not visualized. No evidence of pulmonic stenosis. Aorta: The aortic root is normal in size and structure. Venous: The inferior vena cava is normal in size with less than 50% respiratory variability, suggesting right atrial pressure of 8 mmHg. IAS/Shunts: No atrial level shunt detected by color flow Doppler. Additional Comments: There is a moderate pleural effusion in the left lateral region.  LEFT VENTRICLE PLAX 2D LVIDd:         5.10 cm   Diastology LVIDs:         3.50 cm   LV e' medial:    7.18 cm/s LV PW:         0.90 cm   LV E/e' medial:  21.3 LV IVS:        1.00 cm   LV e' lateral:   8.92 cm/s LVOT diam:     1.90 cm   LV E/e' lateral: 17.2 LVOT Area:     2.84 cm  LEFT ATRIUM             Index LA diam:        4.60 cm 2.95 cm/m LA Vol (A2C):   73.8 ml 47.27 ml/m LA Vol (A4C):   55.5 ml 35.55 ml/m LA Biplane Vol: 67.4 ml 43.17 ml/m  AORTA Ao Root diam: 2.80 cm MITRAL VALVE MV Area (PHT): 3.60 cm       SHUNTS MV Peak grad:  14.3 mmHg      Systemic Diam: 1.90 cm MV Mean grad:  4.0 mmHg MV Vmax:       1.89 m/s MV Vmean:      83.9 cm/s MV Decel Time: 211 msec MR Peak grad:    104.0 mmHg MR Mean grad:    66.0 mmHg MR Vmax:         510.00 cm/s MR Vmean:        376.5 cm/s MR PISA:         1.79 cm MR PISA Eff ROA: 32 mm MR PISA Radius:  0.53 cm MV E velocity: 153.00 cm/s MV A velocity: 88.00 cm/s MV E/A ratio:  1.74 Julien Nordmann MD Electronically signed by Julien Nordmann MD Signature Date/Time: April 05, 2023/4:30:20 PM    Final     Cardiac Studies   Limited echo yesterday 05-Apr-2023  1. Left ventricular ejection fraction, by estimation, is 35 to 40%. The  left ventricle has moderately decreased function. The left ventricle  demonstrates global hypokinesis. Left ventricular diastolic parameters are  indeterminate.   2.  Right ventricular systolic function is normal. The right ventricular  size is normal. Tricuspid regurgitation signal is inadequate for assessing  PA pressure.   3. Left atrial size was moderately dilated.   4. Moderate pleural effusion in the left lateral region.   5. The mitral valve is normal in structure. Moderate to severe mitral  valve regurgitation. No evidence of mitral stenosis.   6. The aortic valve is normal in structure. Aortic valve regurgitation is  not visualized. No aortic stenosis is present.   7. The inferior vena cava is normal in size with <50% respiratory  variability, suggesting right atrial pressure of 8 mmHg.   Patient Profile     Ms. NAYELLIE MOLESKI is a 87 y.o.  female with diabetes mellitus type II, hypertension, coronary artery disease, peripheral arterial disease, tremor, CVA, congestive heart failure and anemia who is admitted to [facility] on 03/25/2023 for COPD exacerbation (HCC) [J44.1] Chest pain [R07.9] Community acquired pneumonia, unspecified laterality [J18.9] Acute diastolic and systolic CHF  Assessment & Plan     1. Acute systolic HF -> cardiogenic shock - Echo 2023 EF 55-60% - Echo this admit read as EF 30-35%, concern for ischemic cardiomyopathy Peak troponin 1100 Cardiac catheterization deferred secondary to renal dysfunction creatinine of 2  Initial co-ox 44% CVP 21 (03/29/23) Treated with milrinone infusion, Co. ox 87 this morning - CVP 9  - Not candidate for ACE/ARB/ARNI/MRA with kidney injury  Defer SGLT2 inhibitor in the setting of urine incontinence, high risk for yeast infection, discussed with nephrology on hydralazine 25 milligrams 3 times daily, nitrates/isosorbide 30 daily Coreg 3.125 twice daily Torsemide 40 daily Low blood pressure after a.m. medications, will continue to monitor   2. NSTEMI - continue ASA/statin Drop in ejection fraction concerning for ischemic cardiomyopathy, no catheterization  in the setting of renal  dysfunction On aspirin, beta-blocker, statin   3. AKI on CKD 3a - likely ATN/cardiorenal  - Scr 1.5 -> 2.8 -> 2.7 -> 2.4 -> 2.1 -> 2.3----2.5--2.63-  - renal u/s 3/21 with medico-renal disease.  On torsemide 40 daily Nephrology following   4. Acute hypoxic respiratory failure COPD, heart failure, pleural effusions contributing - s/p R thora on 6/3 with 700cc out  Respiratory status improving Need to mobilize  out of bed, very weak   5. PAF  Maintaining normal sinus rhythm Amiodarone 200 twice daily, Eliquis 2.5 twice daily, carvedilol 3.125 twice daily   6. Hypervolemic hyponatremia Restrict free water, continue torsemide   7. Urinary retention Foley has been removed   8. DNR/DNI/no dialysis - confirmed with patient and her daughter   No family at the bedside     Length of Stay: 10   For questions or updates, please contact [facility] Please consult [email or web address] for contact info under        Signed, Julien Nordmann, MD  04/04/2023, 11:49 AM

## 2023-04-04 NOTE — Progress Notes (Signed)
[facility]  ROUNDING NOTE   Subjective:   [name] was admitted to [facility] on 03/25/2023 for COPD exacerbation [facility]) [J44.1] Chest pain [R07.9] Community acquired pneumonia, unspecified laterality [J18.9]  Off milrinone  No acute c/o  Foley catheter has been removed but requiring I/O cath Serum creatinine is higher today 2.63 (2.50)(2.31) Potassium is in normal range  Objective:  Vital signs in last 24 hours:  Temp:  [97.7 F (36.5 C)-98.2 F (36.8 C)] 97.7 F (36.5 C) (06/09 0800) Pulse Rate:  [58-80] 63 (06/09 0800) Resp:  [14-27] 15 (06/09 0800) BP: (118-145)/(44-120) 127/45 (06/09 0800) SpO2:  [89 %-98 %] 96 % (06/09 0800)  Weight change: -0.1 kg Filed Weights   04/01/23 0152 04/02/23 0802 04/03/23 0718  Weight: 59.7 kg 59.6 kg 59.5 kg    Intake/Output: I/O last 3 completed shifts: In: 120 [P.O.:120] Out: 300 [Urine:300]   Intake/Output this shift:  No intake/output data recorded.  Physical Exam: General: NAD, sitting in bed  Head: Normocephalic, atraumatic. Moist oral mucosal membranes  Eyes: Anicteric  Lungs:  Mild  wheezing  Heart: regular  Abdomen:  Soft, nontender,   Extremities:  Trace peripheral edema.  Neurologic: Nonfocal, moving all four extremities  Skin: No lesions        Basic Metabolic Panel: Recent Labs  Lab 03/30/23 0345 03/30/23 1423 03/31/23 0446 03/31/23 1446 04/01/23 0503 04/02/23 0406 04/03/23 0505 04/04/23 0544  NA 127*   < > 130* 131* 132* 130* 129* 131*  K 3.6   < > 3.0* 3.8 3.6 4.9 5.3* 4.8  CL 96*   < > 98 97* 99 99 97* 100  CO2 21*   < > 22 23 22 22  21* 23  GLUCOSE 224*   < > 125* 286* 114* 179* 94 68*  BUN 92*   < > 79* 84* 77* 72* 69* 70*  CREATININE 2.69*   < > 2.42* 2.22* 2.16* 2.31* 2.50* 2.63*  CALCIUM 7.5*   < > 7.9* 8.1* 8.0* 8.1* 8.5* 8.6*  MG 2.2  --  2.2  --  2.0 1.9 2.1  --    < > = values in this interval not displayed.     Liver Function Tests: Recent Labs  Lab  03/29/23 1612  PROT 5.3*    No results for input(s): "LIPASE", "AMYLASE" in the last 168 hours. No results for input(s): "AMMONIA" in the last 168 hours.  CBC: Recent Labs  Lab 03/31/23 0446 04/01/23 0503 04/02/23 0406 04/03/23 0505 04/04/23 0544  WBC 10.2 11.0* 14.3* 11.7* 11.7*  HGB 8.5* 8.1* 8.3* 8.6* 8.2*  HCT 25.3* 25.1* 26.2* 26.4* 26.0*  MCV 86.9 88.1 88.8 88.9 90.6  PLT 196 205 215 204 201     Cardiac Enzymes: No results for input(s): "CKTOTAL", "CKMB", "CKMBINDEX", "TROPONINI" in the last 168 hours.  BNP: Invalid input(s): "POCBNP"  CBG: Recent Labs  Lab 04/03/23 1152 04/03/23 1540 04/03/23 2110 04/04/23 0741 04/04/23 0818  GLUCAP 137* 103* 104* 57* 132*     Microbiology: Results for orders placed or performed during the hospital encounter of 03/25/23  Blood Culture (routine x 2)     Status: None   Collection Time: 03/25/23 12:09 PM   Specimen: BLOOD  Result Value Ref Range Status   Specimen Description BLOOD LEFT AC  Final   Special Requests   Final    BOTTLES DRAWN AEROBIC AND ANAEROBIC Blood Culture adequate volume   Culture   Final    NO GROWTH  5 DAYS Performed at [facility], [address] [place], [place] [address]    Report Status 03/30/2023 FINAL  Final  Blood Culture (routine x 2)     Status: None   Collection Time: 03/25/23 12:10 PM   Specimen: BLOOD  Result Value Ref Range Status   Specimen Description BLOOD LEFT FA  Final   Special Requests   Final    BOTTLES DRAWN AEROBIC AND ANAEROBIC Blood Culture adequate volume   Culture   Final    NO GROWTH 5 DAYS Performed at [facility], [address] [place], [place] [address]    Report Status 03/30/2023 FINAL  Final  SARS Coronavirus 2 by RT PCR (hospital order, performed in [facility] hospital lab) *cepheid single result test* Anterior Nasal Swab     Status: None   Collection Time: 03/25/23  1:18 PM   Specimen: Anterior Nasal Swab  Result Value Ref Range  Status   SARS Coronavirus 2 by RT PCR NEGATIVE NEGATIVE Final    Comment: (NOTE) SARS-CoV-2 target nucleic acids are NOT DETECTED.  The SARS-CoV-2 RNA is generally detectable in upper and lower respiratory specimens during the acute phase of infection. The lowest concentration of SARS-CoV-2 viral copies this assay can detect is 250 copies / mL. A negative result does not preclude SARS-CoV-2 infection and should not be used as the sole basis for treatment or other patient management decisions.  A negative result may occur with improper specimen collection / handling, submission of specimen other than nasopharyngeal swab, presence of viral mutation(s) within the areas targeted by this assay, and inadequate number of viral copies (<250 copies / mL). A negative result must be combined with clinical observations, patient history, and epidemiological information.  Fact Sheet for Patients:   RoadLapTop.co.za  Fact Sheet for Healthcare Providers: http://kim-miller.com/  This test is not yet approved or  cleared by the [place] FDA and has been authorized for detection and/or diagnosis of SARS-CoV-2 by FDA under an Emergency Use Authorization (EUA).  This EUA will remain in effect (meaning this test can be used) for the duration of the COVID-19 declaration under Section 564(b)(1) of the Act, 21 U.S.C. section 360bbb-3(b)(1), unless the authorization is terminated or revoked sooner.  Performed at [facility], [address] [place], [place] [address]   MRSA Next Gen by PCR, Nasal     Status: None   Collection Time: 03/27/23  4:40 AM   Specimen: Nasal Mucosa; Nasal Swab  Result Value Ref Range Status   MRSA by PCR Next Gen NOT DETECTED NOT DETECTED Final    Comment: (NOTE) The GeneXpert MRSA Assay (FDA approved for NASAL specimens only), is one component of a comprehensive MRSA colonization surveillance program. It is not  intended to diagnose MRSA infection nor to guide or monitor treatment for MRSA infections. Test performance is not FDA approved in patients less than 43 years old. Performed at [facility], [address] [place], [place] [address]   Respiratory (~20 pathogens) panel by PCR     Status: None   Collection Time: 03/28/23  4:00 PM   Specimen: Nasopharyngeal Swab; Respiratory  Result Value Ref Range Status   Adenovirus NOT DETECTED NOT DETECTED Final   Coronavirus 229E NOT DETECTED NOT DETECTED Final    Comment: (NOTE) The Coronavirus on the Respiratory Panel, DOES NOT test for the novel  Coronavirus (2019 nCoV)    Coronavirus HKU1 NOT DETECTED NOT DETECTED Final   Coronavirus NL63 NOT  DETECTED NOT DETECTED Final   Coronavirus OC43 NOT DETECTED NOT DETECTED Final   Metapneumovirus NOT DETECTED NOT DETECTED Final   Rhinovirus / Enterovirus NOT DETECTED NOT DETECTED Final   Influenza A NOT DETECTED NOT DETECTED Final   Influenza B NOT DETECTED NOT DETECTED Final   Parainfluenza Virus 1 NOT DETECTED NOT DETECTED Final   Parainfluenza Virus 2 NOT DETECTED NOT DETECTED Final   Parainfluenza Virus 3 NOT DETECTED NOT DETECTED Final   Parainfluenza Virus 4 NOT DETECTED NOT DETECTED Final   Respiratory Syncytial Virus NOT DETECTED NOT DETECTED Final   Bordetella pertussis NOT DETECTED NOT DETECTED Final   Bordetella Parapertussis NOT DETECTED NOT DETECTED Final   Chlamydophila pneumoniae NOT DETECTED NOT DETECTED Final   Mycoplasma pneumoniae NOT DETECTED NOT DETECTED Final    Comment: Performed at [facility], [address], [place], [place] [address]  Body fluid culture w Gram Stain     Status: None   Collection Time: 03/29/23 10:27 AM   Specimen: PATH Cytology Pleural fluid  Result Value Ref Range Status   Specimen Description   Final    PLEURAL Performed at [facility], [address], [place], [place] [address]    Special Requests   Final     NONE Performed at [facility], [address] [place], [place] [address]    Gram Stain NO WBC SEEN NO ORGANISMS SEEN   Final   Culture   Final    NO GROWTH 3 DAYS Performed at [facility], [address], [place], [place] [address]    Report Status 04/01/2023 FINAL  Final  MRSA Next Gen by PCR, Nasal     Status: None   Collection Time: 03/29/23  3:00 PM   Specimen: Nasal Mucosa; Nasal Swab  Result Value Ref Range Status   MRSA by PCR Next Gen NOT DETECTED NOT DETECTED Final    Comment: (NOTE) The GeneXpert MRSA Assay (FDA approved for NASAL specimens only), is one component of a comprehensive MRSA colonization surveillance program. It is not intended to diagnose MRSA infection nor to guide or monitor treatment for MRSA infections. Test performance is not FDA approved in patients less than 53 years old. Performed at [facility], [address] [place], [place] [address]   Group A Strep by PCR     Status: None   Collection Time: 03/30/23  6:00 PM   Specimen: Throat; Sterile Swab  Result Value Ref Range Status   Group A Strep by PCR NOT DETECTED NOT DETECTED Final    Comment: Performed at [facility], [address] [place], [place] [address]    Coagulation Studies: No results for input(s): "LABPROT", "INR" in the last 72 hours.   Urinalysis: No results for input(s): "COLORURINE", "LABSPEC", "PHURINE", "GLUCOSEU", "HGBUR", "BILIRUBINUR", "KETONESUR", "PROTEINUR", "UROBILINOGEN", "NITRITE", "LEUKOCYTESUR" in the last 72 hours.  Invalid input(s): "APPERANCEUR"    Imaging: DG Chest Port 1 View  Result Date: 04/02/2023 CLINICAL DATA:  Congestive heart failure. EXAM: PORTABLE CHEST 1 VIEW COMPARISON:  04/01/2023 FINDINGS: Stable heart size. Stable positioning of left jugular central line with the catheter tip at the SVC/RA junction. Congestive heart failure slightly increased bilaterally. Increase in bilateral pleural effusions with  likely slightly greater volume on the left compared to the right. No pneumothorax. IMPRESSION: Increase in congestive heart failure and bilateral pleural effusions. Electronically Signed   By: Irish Lack M.D.   On: 04/02/2023 17:21   ECHOCARDIOGRAM LIMITED  Result Date: 04/02/2023    ECHOCARDIOGRAM LIMITED REPORT   Patient Name:   Jill Hudson Date of Exam: 04/02/2023 Medical Rec #:  [id number]     Height:       60.0 in Accession #:    [id number]    Weight:       131.4 lb Date of Birth:  Dec 21, 1934      BSA:          1.561 m Patient Age:    88 years      BP:           118/57 mmHg Patient Gender: F             HR:           70 bpm. Exam Location:  [facility] Procedure: Limited Echo, Cardiac Doppler and Color Doppler Indications:     Mitral valve disorder  History:         Patient has prior history of Echocardiogram examinations, most                  recent 03/26/2023. CHF, CAD and Acute MI, PAD, COPD and Stroke;                  Risk Factors:Hypertension, Diabetes and Dyslipidemia. CKD.  Sonographer:     Mikki Harbor Referring Phys:  [id number] [name] Diagnosing Phys: Julien Nordmann MD IMPRESSIONS  1. Left ventricular ejection fraction, by estimation, is 35 to 40%. The left ventricle has moderately decreased function. The left ventricle demonstrates global hypokinesis. Left ventricular diastolic parameters are indeterminate.  2. Right ventricular systolic function is normal. The right ventricular size is normal. Tricuspid regurgitation signal is inadequate for assessing PA pressure.  3. Left atrial size was moderately dilated.  4. Moderate pleural effusion in the left lateral region.  5. The mitral valve is normal in structure. Moderate to severe mitral valve regurgitation. No evidence of mitral stenosis.  6. The aortic valve is normal in structure. Aortic valve regurgitation is not visualized. No aortic stenosis is present.  7. The inferior vena cava is normal in size with <50% respiratory variability,  suggesting right atrial pressure of 8 mmHg. FINDINGS  Left Ventricle: Left ventricular ejection fraction, by estimation, is 35 to 40%. The left ventricle has moderately decreased function. The left ventricle demonstrates global hypokinesis. The left ventricular internal cavity size was normal in size. There is no left ventricular hypertrophy. Left ventricular diastolic parameters are indeterminate. Right Ventricle: The right ventricular size is normal. No increase in right ventricular wall thickness. Right ventricular systolic function is normal. Tricuspid regurgitation signal is inadequate for assessing PA pressure. Left Atrium: Left atrial size was moderately dilated. Right Atrium: Right atrial size was normal in size. Pericardium: There is no evidence of pericardial effusion. Mitral Valve: The mitral valve is normal in structure. Moderate to severe mitral valve regurgitation, with eccentric posteriorly directed jet. No evidence of mitral valve stenosis. MV peak gradient, 14.3 mmHg. The mean mitral valve gradient is 4.0 mmHg. Tricuspid Valve: The tricuspid valve is normal in structure. Tricuspid valve regurgitation is not demonstrated. No evidence of tricuspid stenosis. Aortic Valve: The aortic valve is normal in structure. Aortic valve regurgitation is not visualized. No aortic stenosis is present. Pulmonic Valve: The pulmonic valve was normal in structure. Pulmonic valve regurgitation is not visualized. No evidence of pulmonic stenosis. Aorta: The aortic root is normal in size and structure. Venous: The inferior vena cava is normal in size with  less than 50% respiratory variability, suggesting right atrial pressure of 8 mmHg. IAS/Shunts: No atrial level shunt detected by color flow Doppler. Additional Comments: There is a moderate pleural effusion in the left lateral region.  LEFT VENTRICLE PLAX 2D LVIDd:         5.10 cm   Diastology LVIDs:         3.50 cm   LV e' medial:    7.18 cm/s LV PW:         0.90 cm   LV  E/e' medial:  21.3 LV IVS:        1.00 cm   LV e' lateral:   8.92 cm/s LVOT diam:     1.90 cm   LV E/e' lateral: 17.2 LVOT Area:     2.84 cm  LEFT ATRIUM             Index LA diam:        4.60 cm 2.95 cm/m LA Vol (A2C):   73.8 ml 47.27 ml/m LA Vol (A4C):   55.5 ml 35.55 ml/m LA Biplane Vol: 67.4 ml 43.17 ml/m   AORTA Ao Root diam: 2.80 cm MITRAL VALVE MV Area (PHT): 3.60 cm       SHUNTS MV Peak grad:  14.3 mmHg      Systemic Diam: 1.90 cm MV Mean grad:  4.0 mmHg MV Vmax:       1.89 m/s MV Vmean:      83.9 cm/s MV Decel Time: 211 msec MR Peak grad:    104.0 mmHg MR Mean grad:    66.0 mmHg MR Vmax:         510.00 cm/s MR Vmean:        376.5 cm/s MR PISA:         1.79 cm MR PISA Eff ROA: 32 mm MR PISA Radius:  0.53 cm MV E velocity: 153.00 cm/s MV A velocity: 88.00 cm/s MV E/A ratio:  1.74 Julien Nordmann MD Electronically signed by Julien Nordmann MD Signature Date/Time: 04/02/2023/4:30:20 PM    Final      Medications:      amiodarone  200 mg Oral BID   apixaban  2.5 mg Oral BID   arformoterol  15 mcg Nebulization BID   aspirin EC  81 mg Oral Daily   carvedilol  3.125 mg Oral BID WC   Chlorhexidine Gluconate Cloth  6 each Topical Daily   cyanocobalamin  1,000 mcg Oral Daily   dextrose  1 Tube Oral STAT   feeding supplement (GLUCERNA SHAKE)  237 mL Oral TID BM   gabapentin  100 mg Oral QHS   hydrALAZINE  25 mg Oral Q8H   insulin aspart  0-15 Units Subcutaneous TID WC   insulin aspart  0-5 Units Subcutaneous QHS   insulin glargine-yfgn  15 Units Subcutaneous QHS   isosorbide mononitrate  30 mg Oral Daily   multivitamin  1 tablet Oral BID   patiromer  16.8 g Oral Daily   polyethylene glycol  17 g Oral Daily   promethazine  6.25 mg Oral QHS   revefenacin  175 mcg Nebulization Daily   rosuvastatin  10 mg Oral Once per day on Mon Wed Fri Sat   torsemide  40 mg Oral Daily   acetaminophen, alum & mag hydroxide-simeth, dextromethorphan-guaiFENesin, hydrALAZINE, levalbuterol, melatonin,  ondansetron (ZOFRAN) IV, mouth rinse, oxyCODONE, phenol, senna-docusate, traZODone  Assessment/ Plan:  [name] is a 88 y.o.  female with diabetes mellitus type II, hypertension,  coronary artery disease, peripheral arterial disease, tremor, CVA, congestive heart failure and anemia who is admitted to [facility] on 03/25/2023 for COPD exacerbation (HCC) [J44.1] Chest pain [R07.9] Community acquired pneumonia, unspecified laterality [J18.9]  Acute kidney injury with acute metabolic acidosis on chronic kidney disease stage IIIB: baseline creatinine of 1.48, GFR of 34 on 12/09/2022. Acute kidney injury secondary to acute cardiorenal syndrome . Chronic kidney disease secondary to hypertension.  Serum creatinine slightly worse today.   Maintain hemodynamic stability. Serial bladder scans. I/O cath as needed. Avoid hypotension/nonsteroidals/IV contrast Electrolytes and volume status are acceptable.  No acute indication for dialysis   Hypertension with chronic kidney disease: with chronic systolic and diastolic congestive heart failure.  Appreciate cardiology input.  Heart failure regimen includes isosorbide/hydralazine Patient has incontinence occasionally.  Hesitant to start SGLT2 inhibitor.  Hyponatremia: -Sodium slowly improving with volume status.  Sodium level has ranged between 129-132.  Continue to monitor closely.  4.  Hyperkalemia Likely secondary to AKI. K improved; ok to hold veltassa   LOS: 10 Elija Mccamish 6/9/20248:29 AM

## 2023-04-04 NOTE — Progress Notes (Signed)
Progress Note   Patient: Jill Hudson ZOX:[id number] DOB: 04/13/1935 DOA: 03/25/2023     10 DOS: the patient was seen and examined on 04/04/2023   Brief hospital course: 88 year old female with history of COPD, HTN, HLD, DM2, stroke, TIA, GERD, anxiety, PVD, CAD, MI, former tobacco use, CKD stage III, GI bleed, iron deficiency anemia, PAD status post PCI comes to the hospital with shortness of breath and hypoxia. Patient was found to have bilateral pulmonary edema /effusion with elevated BNP.  Patient is admitted for further evaluation.  Cardiology is consulted.  Patient will eventually need right and left heart cath pending improvement in renal functions.   6/5.  Patient feeling a little bit better than when she came in.  On heparin drip, milrinone drip, Lasix drip for cardiogenic shock.  Amiodarone drip added for atrial fibrillation. 6/6.  Now on torsemide instead of Lasix drip.  Creatinine down to 2.16 6/7.  Cardiology decided to discontinue milrinone drip.  Switching off amiodarone drip.  Converting heparin drip over to Eliquis.  Creatinine up to 2.31.  Patient in normal sinus rhythm. 6/8.  Creatinine up to 2.5 and potassium 5.3.  Lokelma ordered.  Potassium supplementation discontinued 6/9.  Creatinine up at 2.63.  Needed in and out catheterization this morning.  Assessment and Plan: * Cardiogenic shock [facility]) Cardiology took off milrinone drip 6/7.  Not on any pressors at this point.  Acute on chronic systolic CHF (congestive heart failure) (HCC) Patient on torsemide, hydralazine, Imdur and low-dose Coreg.  No ACE/ARB or spironolactone with her kidney function.  Paroxysmal atrial fibrillation (HCC) Patient converted to normal sinus rhythm.  Taken off amiodarone drip and switched to oral on 6/7.  Heparin drip converted over to Eliquis on 6/7.  Acute kidney injury superimposed on CKD (HCC) AKI on CKD stage IIIa.  Creatinine was as high as 2.89.  Last creatinine 2.63.  NSTEMI (non-ST  elevated myocardial infarction) (HCC) With creatinine above 2, medical management.  Continue aspirin, Imdur Coreg and low-dose Crestor.  Type II diabetes mellitus with renal manifestations (HCC) Last hemoglobin A1c 7.9.  Hypoglycemic episode this morning.  Decrease Semglee insulin down to 8 units at night.  Continue sliding scale.  COPD exacerbation (HCC) Continue nebulizers.  Essential hypertension Continue hydralazine, Imdur and low-dose Coreg.  History of CVA (cerebrovascular accident) Patient on Eliquis for stroke prevention  HLD (hyperlipidemia) On Crestor  Hyponatremia Sodium 131.  Iron deficiency anemia Last hemoglobin 8.2.  Continue to monitor.  Acute urinary retention Patient required in and out catheterization this morning.  Continue to monitor.  Hyperkalemia Potassium 4.8.  Continue to monitor.  Pleural effusion Right thoracentesis removed 700 mL of fluid.  This was done by interventional radiology on 6/3.  Acute respiratory failure with hypoxia (HCC) Patient initially requiring BiPAP secondary to respiratory distress.  Currently on 2 L.        Subjective: Patient feeling little bit better today.  Slept a little bit better.  Needed in and out catheterization this morning.  Admitted with heart failure exacerbation.  Physical Exam: Vitals:   04/04/23 0900 04/04/23 1000 04/04/23 1100 04/04/23 1200  BP: (!) 137/110 (!) 131/59 103/69 (!) 130/43  Pulse: 97 70 70 65  Resp: (!) 31 (!) 23 (!) 21 (!) 22  Temp:    97.9 F (36.6 C)  TempSrc:    Oral  SpO2: 100% 99% 97% 97%  Weight:      Height:       Physical Exam HENT:  Head: Normocephalic.     Mouth/Throat:     Pharynx: No oropharyngeal exudate.  Eyes:     General: Lids are normal.     Conjunctiva/sclera: Conjunctivae normal.  Cardiovascular:     Rate and Rhythm: Normal rate and regular rhythm.     Heart sounds: Normal heart sounds, S1 normal and S2 normal.  Pulmonary:     Breath sounds:  Examination of the right-lower field reveals decreased breath sounds. Examination of the left-lower field reveals decreased breath sounds. Decreased breath sounds present. No wheezing, rhonchi or rales.  Abdominal:     Palpations: Abdomen is soft.     Tenderness: There is no abdominal tenderness.  Musculoskeletal:     Right lower leg: No swelling.     Left lower leg: No swelling.  Skin:    General: Skin is warm.     Findings: No rash.  Neurological:     Mental Status: She is alert and oriented to person, place, and time.     Data Reviewed: Sodium 131, potassium 4.8, creatinine 2.63, white blood cell count 11.7, hemoglobin 8.2, platelet count 201  Family Communication: Spoke with son and daughter and daughter-in-law at the bedside  Disposition: Status is: Inpatient Remains inpatient appropriate because:   Planned Discharge Destination: Home    Time spent: 29 minutes Case discussed with nephrology  Author: Alford Highland, MD 04/04/2023 12:31 PM  For on call review [email or web address].

## 2023-04-04 NOTE — Assessment & Plan Note (Signed)
Patient required in and out catheterization this morning.  Continue to monitor.

## 2023-04-04 NOTE — Progress Notes (Signed)
Occupational Therapy Treatment Patient Details Name: Jill Hudson MRN: [id number] DOB: Jun 07, 1935 Today's Date: 04/04/2023   History of present illness presented to ER secondary to progressive SOB; admitted for management of A/C CHF exacerbation, AECOPD.  s/p thoracentesis (6/3) with removal of fluid; pending cardiac cath and TEE   OT comments  Pt seen for OT tx. Pt received in bed, sleeping lightly, wakes to OT's voice and agreeable to session. Pt endorses feeling somewhat SOB. VSS. Pt required MIN-MOD A for sup>sit EOB to shift trunk, supv for seated balance with MIN A for UB dressing (gown), MAX A for adjusting socks, and MOD A for STS with RW 2/2 poor initial standing balance, improving to CGA-MIN A for step pivot with RW to recliner. Pt instructed in PLB with exertional activity to support recovery and minimize SOB. Pt required intermittent VC to utilize with proper technique. SpO2 94% on 1L O2. BP 133/53. RN aware, pt up in recliner with all needs in reach.    Recommendations for follow up therapy are one component of a multi-disciplinary discharge planning process, led by the attending physician.  Recommendations may be updated based on patient status, additional functional criteria and insurance authorization.    Assistance Recommended at Discharge Intermittent Supervision/Assistance  Patient can return home with the following  A little help with walking and/or transfers;A little help with bathing/dressing/bathroom;Assistance with cooking/housework;Assist for transportation;Help with stairs or ramp for entrance   Equipment Recommendations  BSC/3in1    Recommendations for Other Services      Precautions / Restrictions Precautions Precautions: Fall Restrictions Weight Bearing Restrictions: No       Mobility Bed Mobility Overal bed mobility: Needs Assistance Bed Mobility: Supine to Sit     Supine to sit: Min assist, HOB elevated, Mod assist     General bed mobility  comments: Min-MOD A for shifting trunk    Transfers Overall transfer level: Needs assistance Equipment used: Rolling walker (2 wheels) Transfers: Sit to/from Stand, Bed to chair/wheelchair/BSC Sit to Stand: Mod assist     Step pivot transfers: Min guard, Min assist     General transfer comment: VC for pulling feet back towards the bed     Balance Overall balance assessment: Needs assistance Sitting-balance support: Feet supported Sitting balance-Leahy Scale: Good   Postural control: Posterior lean Standing balance support: Bilateral upper extremity supported, During functional activity, Reliant on assistive device for balance Standing balance-Leahy Scale: Poor Standing balance comment: initial posterior lean with weight back on heels/feet a bit too far out in front of her but able to correct with VC                           ADL either performed or assessed with clinical judgement   ADL Overall ADL's : Needs assistance/impaired                     Lower Body Dressing: Sitting/lateral leans;Maximal assistance Lower Body Dressing Details (indicate cue type and reason): MAX A for adjusting socks prior to transfer, pt too winded to attempt                    Extremity/Trunk Assessment              Vision       Perception     Praxis      Cognition Arousal/Alertness: Awake/alert Behavior During Therapy: WFL for tasks assessed/performed Overall Cognitive Status: Within  Functional Limits for tasks assessed                                          Exercises Other Exercises Other Exercises: Pt instructed in PLB with exertional activity to support breath recovery, able to return demo requiring VC for proper technique    Shoulder Instructions       General Comments      Pertinent Vitals/ Pain       Pain Assessment Pain Assessment: No/denies pain  Home Living                                           Prior Functioning/Environment              Frequency  Min 2X/week        Progress Toward Goals  OT Goals(current goals can now be found in the care plan section)  Progress towards OT goals: Progressing toward goals  Acute Rehab OT Goals Patient Stated Goal: feel stronger and go home OT Goal Formulation: With patient Time For Goal Achievement: 04/12/23 Potential to Achieve Goals: Good  Plan Frequency remains appropriate;Discharge plan remains appropriate    Co-evaluation                 AM-PAC OT "6 Clicks" Daily Activity     Outcome Measure   Help from another person eating meals?: None Help from another person taking care of personal grooming?: A Little Help from another person toileting, which includes using toliet, bedpan, or urinal?: A Little Help from another person bathing (including washing, rinsing, drying)?: A Lot Help from another person to put on and taking off regular upper body clothing?: A Little Help from another person to put on and taking off regular lower body clothing?: A Lot 6 Click Score: 17    End of Session Equipment Utilized During Treatment: Rolling walker (2 wheels);Oxygen  OT Visit Diagnosis: Unsteadiness on feet (R26.81);Muscle weakness (generalized) (M62.81);History of falling (Z91.81)   Activity Tolerance Patient tolerated treatment well   Patient Left in chair;with call bell/phone within reach   Nurse Communication Mobility status        Time: 1610-9604 OT Time Calculation (min): 19 min  Charges: OT General Charges $OT Visit: 1 Visit OT Treatments $Self Care/Home Management : 8-22 mins  Arman Filter., MPH, MS, OTR/L ascom [phone] 04/04/23, 3:17 PM

## 2023-04-05 DIAGNOSIS — E871 Hypo-osmolality and hyponatremia: Secondary | ICD-10-CM | POA: Diagnosis not present

## 2023-04-05 DIAGNOSIS — I480 Paroxysmal atrial fibrillation: Secondary | ICD-10-CM | POA: Diagnosis not present

## 2023-04-05 DIAGNOSIS — N179 Acute kidney failure, unspecified: Secondary | ICD-10-CM | POA: Diagnosis not present

## 2023-04-05 DIAGNOSIS — E875 Hyperkalemia: Secondary | ICD-10-CM | POA: Diagnosis not present

## 2023-04-05 DIAGNOSIS — I10 Essential (primary) hypertension: Secondary | ICD-10-CM | POA: Diagnosis not present

## 2023-04-05 DIAGNOSIS — I129 Hypertensive chronic kidney disease with stage 1 through stage 4 chronic kidney disease, or unspecified chronic kidney disease: Secondary | ICD-10-CM | POA: Diagnosis not present

## 2023-04-05 DIAGNOSIS — E1121 Type 2 diabetes mellitus with diabetic nephropathy: Secondary | ICD-10-CM | POA: Diagnosis not present

## 2023-04-05 DIAGNOSIS — I214 Non-ST elevation (NSTEMI) myocardial infarction: Secondary | ICD-10-CM | POA: Diagnosis not present

## 2023-04-05 DIAGNOSIS — J441 Chronic obstructive pulmonary disease with (acute) exacerbation: Secondary | ICD-10-CM | POA: Diagnosis not present

## 2023-04-05 DIAGNOSIS — Z8673 Personal history of transient ischemic attack (TIA), and cerebral infarction without residual deficits: Secondary | ICD-10-CM | POA: Diagnosis not present

## 2023-04-05 DIAGNOSIS — R570 Cardiogenic shock: Secondary | ICD-10-CM | POA: Diagnosis not present

## 2023-04-05 DIAGNOSIS — I5023 Acute on chronic systolic (congestive) heart failure: Secondary | ICD-10-CM | POA: Diagnosis not present

## 2023-04-05 LAB — BASIC METABOLIC PANEL
Anion gap: 12 (ref 5–15)
BUN: 68 mg/dL — ABNORMAL HIGH (ref 8–23)
CO2: 20 mmol/L — ABNORMAL LOW (ref 22–32)
Calcium: 8.4 mg/dL — ABNORMAL LOW (ref 8.9–10.3)
Chloride: 98 mmol/L (ref 98–111)
Creatinine, Ser: 2.97 mg/dL — ABNORMAL HIGH (ref 0.44–1.00)
GFR, Estimated: 15 mL/min — ABNORMAL LOW (ref >60)
Glucose, Bld: 87 mg/dL (ref 70–99)
Potassium: 4.2 mmol/L (ref 3.5–5.1)
Sodium: 130 mmol/L — ABNORMAL LOW (ref 135–145)

## 2023-04-05 LAB — GLUCOSE, CAPILLARY
Glucose-Capillary: 90 mg/dL (ref 70–99)
Glucose-Capillary: 173 mg/dL — ABNORMAL HIGH (ref 70–99)
Glucose-Capillary: 144 mg/dL — ABNORMAL HIGH (ref 70–99)
Glucose-Capillary: 114 mg/dL — ABNORMAL HIGH (ref 70–99)

## 2023-04-05 LAB — COOXEMETRY PANEL
Carboxyhemoglobin: 1.2 % (ref 0.5–1.5)
Methemoglobin: 1.2 % (ref 0.0–1.5)
O2 Saturation: 58.3 %
Total hemoglobin: 9.6 g/dL — ABNORMAL LOW (ref 12.0–16.0)
Total oxygen content: 56.9 %

## 2023-04-05 MED ORDER — NEPRO/CARBSTEADY PO LIQD
237.0000 mL | Freq: Two times a day (BID) | ORAL | Status: DC
  Administered 2023-04-06 – 2023-04-20 (×18): 237 mL via ORAL

## 2023-04-05 MED ORDER — FUROSEMIDE 10 MG/ML IJ SOLN: 80.0000 mg | Freq: Two times a day (BID) | INTRAMUSCULAR | Status: DC

## 2023-04-05 MED ORDER — FUROSEMIDE 10 MG/ML IJ SOLN
80.0000 mg | Freq: Two times a day (BID) | INTRAMUSCULAR | Status: DC
  Administered 2023-04-05 – 2023-04-06 (×4): 80 mg via INTRAVENOUS
  Filled 2023-04-05 (×5): qty 8

## 2023-04-05 NOTE — Progress Notes (Signed)
Physical Therapy Treatment Patient Details Name: Jill Hudson MRN: [id number] DOB: 1934/12/05 Today's Date: 04/05/2023   History of Present Illness presented to ER secondary to progressive SOB; admitted for management of A/C CHF exacerbation, AECOPD.  s/p thoracentesis (6/3) with removal of fluid; pending cardiac cath and TEE    PT Comments    Patient is agreeable to PT. She was able to stand with assistance x 1 bout. Assistance required to take several side steps with heavy reliance on rolling walker and the bed for posterior leg support. Activity tolerance is limited by fatigue with dyspnea with exertion. Cues for breathing techniques provided with carry over demonstrated. Recommend to continue PT to maximize independence and decrease caregiver burden.    Recommendations for follow up therapy are one component of a multi-disciplinary discharge planning process, led by the attending physician.  Recommendations may be updated based on patient status, additional functional criteria and insurance authorization.  Follow Up Recommendations  Can patient physically be transported by private vehicle: No    Assistance Recommended at Discharge Frequent or constant Supervision/Assistance  Patient can return home with the following A lot of help with walking and/or transfers;A little help with bathing/dressing/bathroom;Assist for transportation   Equipment Recommendations  Rolling walker (2 wheels)    Recommendations for Other Services       Precautions / Restrictions Precautions Precautions: Fall Restrictions Weight Bearing Restrictions: No     Mobility  Bed Mobility Overal bed mobility: Needs Assistance Bed Mobility: Supine to Sit, Sit to Supine     Supine to sit: Mod assist, HOB elevated Sit to supine: Mod assist, HOB elevated   General bed mobility comments: assistance for trunk support to sit upright. assistance for BLE support to return to bed. verbal cues for sequencing  and technique    Transfers Overall transfer level: Needs assistance Equipment used: Rolling walker (2 wheels) Transfers: Sit to/from Stand Sit to Stand: Mod assist           General transfer comment: lifting and lowering assistance provided for transfer. verbal cues for hand placement for safety. patient declined getting up to chair at this time. standing performed x 1 bout before needing to return to bed due to fatigue and dyspnea with exertion    Ambulation/Gait               General Gait Details: patient able to take 3 small side steps to the left using rolling walker with cues for technique. moderate assistance required for weight shifting/steadying. patient relying on bed for posterior leg support while standing. standing tolerance is limited for progression of further ambulation at this time   Stairs             Wheelchair Mobility    Modified Rankin (Stroke Patients Only)       Balance Overall balance assessment: Needs assistance Sitting-balance support: Feet supported Sitting balance-Leahy Scale:  (poor to fair) Sitting balance - Comments: poor initially progressing to fair   Standing balance support: Bilateral upper extremity supported, During functional activity, Reliant on assistive device for balance Standing balance-Leahy Scale: Poor Standing balance comment: external support required with posterior lean                            Cognition Arousal/Alertness: Awake/alert Behavior During Therapy: WFL for tasks assessed/performed Overall Cognitive Status: Within Functional Limits for tasks assessed  Exercises      General Comments General comments (skin integrity, edema, etc.): dyspnea with exertion with cues for breathing techniques. vitals stable on 1 L02.      Pertinent Vitals/Pain Pain Assessment Pain Assessment: No/denies pain    Home Living                           Prior Function            PT Goals (current goals can now be found in the care plan section) Acute Rehab PT Goals Patient Stated Goal: to get stronger and go home PT Goal Formulation: With patient Time For Goal Achievement: 04/12/23 Potential to Achieve Goals: Good Progress towards PT goals: Progressing toward goals    Frequency    Min 3X/week      PT Plan Current plan remains appropriate    Co-evaluation              AM-PAC PT "6 Clicks" Mobility   Outcome Measure  Help needed turning from your back to your side while in a flat bed without using bedrails?: A Lot Help needed moving from lying on your back to sitting on the side of a flat bed without using bedrails?: A Lot Help needed moving to and from a bed to a chair (including a wheelchair)?: A Little Help needed standing up from a chair using your arms (e.g., wheelchair or bedside chair)?: A Little Help needed to walk in hospital room?: A Lot Help needed climbing 3-5 steps with a railing? : Total 6 Click Score: 13    End of Session Equipment Utilized During Treatment: Oxygen Activity Tolerance: Patient limited by fatigue Patient left: in bed;with call bell/phone within reach Nurse Communication: Mobility status PT Visit Diagnosis: Muscle weakness (generalized) (M62.81);Difficulty in walking, not elsewhere classified (R26.2)     Time: 1610-9604 PT Time Calculation (min) (ACUTE ONLY): 21 min  Charges:  $Therapeutic Activity: 8-22 mins                    [name], PT, MPT    Ina Homes 04/05/2023, 9:35 AM

## 2023-04-05 NOTE — Progress Notes (Signed)
Progress Note   Patient: Jill Hudson AVW:[id number] DOB: June 04, 1935 DOA: 03/25/2023     11 DOS: the patient was seen and examined on 04/05/2023   Brief hospital course: 88 year old female with history of COPD, HTN, HLD, DM2, stroke, TIA, GERD, anxiety, PVD, CAD, MI, former tobacco use, CKD stage III, GI bleed, iron deficiency anemia, PAD status post PCI comes to the hospital with shortness of breath and hypoxia. Patient was found to have bilateral pulmonary edema /effusion with elevated BNP.  Patient is admitted for further evaluation.  Cardiology is consulted.  Patient will eventually need right and left heart cath pending improvement in renal functions.   6/5.  Patient feeling a little bit better than when she came in.  On heparin drip, milrinone drip, Lasix drip for cardiogenic shock.  Amiodarone drip added for atrial fibrillation. 6/6.  Now on torsemide instead of Lasix drip.  Creatinine down to 2.16 6/7.  Cardiology decided to discontinue milrinone drip.  Switching off amiodarone drip.  Converting heparin drip over to Eliquis.  Creatinine up to 2.31.  Patient in normal sinus rhythm. 6/8.  Creatinine up to 2.5 and potassium 5.3.  Lokelma ordered.  Potassium supplementation discontinued 6/9.  Creatinine up at 2.63.  Needed in and out catheterization this morning. 6/10.  Creatinine 2.97, transfer out of ICU to progressive care.  Urinating well.  Assessment and Plan: * Cardiogenic shock [facility]) Cardiology took off milrinone drip 6/7.  Not on any pressors at this point.  Acute on chronic systolic CHF (congestive heart failure) (HCC) Patient on torsemide, hydralazine, Imdur and low-dose Coreg.  No ACE/ARB or spironolactone with her kidney function.  Paroxysmal atrial fibrillation (HCC) Patient converted to normal sinus rhythm.  Taken off amiodarone drip and switched to oral on 6/7.  Heparin drip converted over to Eliquis on 6/7.  Acute kidney injury superimposed on CKD (HCC) AKI on CKD stage  IIIa.  Creatinine now 2.97.  Continue to monitor.  NSTEMI (non-ST elevated myocardial infarction) (HCC) With creatinine above 2, medical management.  Continue aspirin, Imdur Coreg and low-dose Crestor.  Type II diabetes mellitus with renal manifestations (HCC) Last hemoglobin A1c 7.9.  Hypoglycemic episode this morning.  Decreased Semglee insulin down to 8 units at night.  Continue sliding scale.  COPD exacerbation (HCC) Continue nebulizers.  Essential hypertension Continue hydralazine, Imdur and low-dose Coreg.  History of CVA (cerebrovascular accident) Patient on Eliquis for stroke prevention  HLD (hyperlipidemia) On Crestor  Hyponatremia Sodium 130.  Iron deficiency anemia Last hemoglobin 8.2.  Continue to monitor.  Acute urinary retention Patient required in and out catheterization this morning.  Continue to monitor.  Hyperkalemia Normal range today.  Continue to monitor.  Pleural effusion Right thoracentesis removed 700 mL of fluid.  This was done by interventional radiology on 6/3.  Acute respiratory failure with hypoxia (HCC) Patient initially requiring BiPAP secondary to respiratory distress.  Currently on 2 L.        Subjective: Patient feeling weak.  Still having some shortness of breath.  Admitted with CHF exacerbation and NSTEMI.  Physical Exam: Vitals:   04/05/23 0900 04/05/23 0922 04/05/23 1100 04/05/23 1221  BP: (!) 185/141 (!) 133/57 128/87 (!) 123/39  Pulse: (!) 105 68 68 64  Resp: (!) 25 (!) 21 17 20   Temp:      TempSrc:      SpO2: (!) 89% 96% 95% 95%  Weight:      Height:       Physical Exam HENT:  Head: Normocephalic.     Mouth/Throat:     Pharynx: No oropharyngeal exudate.  Eyes:     General: Lids are normal.     Conjunctiva/sclera: Conjunctivae normal.  Cardiovascular:     Rate and Rhythm: Normal rate and regular rhythm.     Heart sounds: Normal heart sounds, S1 normal and S2 normal.  Pulmonary:     Breath sounds:  Examination of the right-lower field reveals decreased breath sounds. Examination of the left-lower field reveals decreased breath sounds. Decreased breath sounds present. No wheezing, rhonchi or rales.  Abdominal:     Palpations: Abdomen is soft.     Tenderness: There is no abdominal tenderness.  Musculoskeletal:     Right lower leg: No swelling.     Left lower leg: No swelling.  Skin:    General: Skin is warm.     Findings: No rash.  Neurological:     Mental Status: She is alert and oriented to person, place, and time.     Data Reviewed: Creatinine 2.97, sodium 130  Family Communication: Spoke with daughter at bedside  Disposition: Status is: Inpatient Remains inpatient appropriate because: Creatinine continues to rise.  Continue to monitor closely.  Fluid status tenuous.  Planned Discharge Destination: Rehab    Time spent: 28 minutes  Author: Alford Highland, MD 04/05/2023 12:37 PM  For on call review [email or web address].

## 2023-04-05 NOTE — Progress Notes (Signed)
Advanced Heart Failure Team Progress Note   Primary Physician: Reubin Milan, MD PCP-Cardiologist:  None  Reason for Consultation: Acute systolic HF  Interval history  6/6 Switched to torsemide.  6/7 Off milrinone. Placed on oral amio.  6/8 Low dose coreg started.   Creatinine trending back up.  Lab Results  Component Value Date   CREATININE 2.97 (H) 04/05/2023   CREATININE 2.63 (H) 04/04/2023   CREATININE 2.50 (H) 04/03/2023    CO-OX 58%.   SOB with exertion.   Objective:    Vital Signs:   Temp:  [97.9 F (36.6 C)-98.2 F (36.8 C)] 98 F (36.7 C) (06/10 0400) Pulse Rate:  [48-97] 58 (06/10 0818) Resp:  [12-31] 14 (06/10 0800) BP: (83-137)/(41-110) 131/52 (06/10 0800) SpO2:  [89 %-100 %] 94 % (06/10 0800) FiO2 (%):  [24 %] 24 % (06/09 2058) Weight:  [61.3 kg] 61.3 kg (06/10 0500) Last BM Date : 04/04/23  Weight change: Filed Weights   04/03/23 0718 04/04/23 0714 04/05/23 0500  Weight: 59.5 kg 61.8 kg 61.3 kg    Intake/Output:   Intake/Output Summary (Last 24 hours) at 04/05/2023 0841 Last data filed at 04/04/2023 1800 Gross per 24 hour  Intake 1010 ml  Output 720 ml  Net 290 ml   CVP 19-20 personally checked.  Physical Exam    General:  Appears weak.  No resp difficulty HEENT: normal Neck: supple. JVD to jaw . Carotids 2+ bilat; no bruits. No lymphadenopathy or thryomegaly appreciated. LIH  Cor: PMI nondisplaced. Regular rate & rhythm. No rubs, gallops or murmurs. Lungs: Decreased on left on 2 liters [place].  Abdomen: soft, nontender, nondistended. No hepatosplenomegaly. No bruits or masses. Good bowel sounds. Extremities: no cyanosis, clubbing, rash, edema Neuro: alert & orientedx3, cranial nerves grossly intact. moves all 4 extremities w/o difficulty. Affect pleasant   Telemetry   SR 70-90s   Labs   Basic Metabolic Panel: Recent Labs  Lab 03/30/23 0345 03/30/23 1423 03/31/23 0446 03/31/23 1446 04/01/23 0503 04/02/23 0406  04/03/23 0505 04/04/23 0544  NA 127*   < > 130* 131* 132* 130* 129* 131*  K 3.6   < > 3.0* 3.8 3.6 4.9 5.3* 4.8  CL 96*   < > 98 97* 99 99 97* 100  CO2 21*   < > 22 23 22 22  21* 23  GLUCOSE 224*   < > 125* 286* 114* 179* 94 68*  BUN 92*   < > 79* 84* 77* 72* 69* 70*  CREATININE 2.69*   < > 2.42* 2.22* 2.16* 2.31* 2.50* 2.63*  CALCIUM 7.5*   < > 7.9* 8.1* 8.0* 8.1* 8.5* 8.6*  MG 2.2  --  2.2  --  2.0 1.9 2.1  --    < > = values in this interval not displayed.    Liver Function Tests: Recent Labs  Lab 03/29/23 1612  PROT 5.3*   No results for input(s): "LIPASE", "AMYLASE" in the last 168 hours. No results for input(s): "AMMONIA" in the last 168 hours.  CBC: Recent Labs  Lab 03/31/23 0446 04/01/23 0503 04/02/23 0406 04/03/23 0505 04/04/23 0544  WBC 10.2 11.0* 14.3* 11.7* 11.7*  HGB 8.5* 8.1* 8.3* 8.6* 8.2*  HCT 25.3* 25.1* 26.2* 26.4* 26.0*  MCV 86.9 88.1 88.8 88.9 90.6  PLT 196 205 215 204 201    Cardiac Enzymes: No results for input(s): "CKTOTAL", "CKMB", "CKMBINDEX", "TROPONINI" in the last 168 hours.  BNP: BNP (last 3 results) Recent Labs  03/25/23 1001  BNP 971.3*    ProBNP (last 3 results) No results for input(s): "PROBNP" in the last 8760 hours.   CBG: Recent Labs  Lab 04/04/23 0818 04/04/23 1140 04/04/23 1602 04/04/23 2107 04/05/23 0754  GLUCAP 132* 116* 159* 130* 90    Coagulation Studies: No results for input(s): "LABPROT", "INR" in the last 72 hours.   Imaging   No results found.   Medications:     Current Medications:  amiodarone  200 mg Oral BID   apixaban  2.5 mg Oral BID   arformoterol  15 mcg Nebulization BID   aspirin EC  81 mg Oral Daily   carvedilol  3.125 mg Oral BID WC   Chlorhexidine Gluconate Cloth  6 each Topical Daily   cyanocobalamin  1,000 mcg Oral Daily   dextrose  1 Tube Oral STAT   feeding supplement (GLUCERNA SHAKE)  237 mL Oral TID BM   gabapentin  100 mg Oral QHS   hydrALAZINE  25 mg Oral Q8H    insulin aspart  0-15 Units Subcutaneous TID WC   insulin aspart  0-5 Units Subcutaneous QHS   insulin glargine-yfgn  8 Units Subcutaneous QHS   isosorbide mononitrate  30 mg Oral Daily   multivitamin  1 tablet Oral BID   nystatin   Topical TID   polyethylene glycol  17 g Oral Daily   promethazine  6.25 mg Oral QHS   revefenacin  175 mcg Nebulization Daily   rosuvastatin  10 mg Oral Once per day on Mon Wed Fri Sat   torsemide  40 mg Oral Daily    Infusions:      Assessment/Plan   1. Acute systolic HF -> cardiogenic shock - Echo 2023 EF 55-60% - Echo this admit read as EF 30-35%. RV ok  -Per Dr Gala Romney EF 35-40% there is severe HK of basaliar to mid lateral wall and inferolateral wall - High-sensitivity troponin 225 -> 458 - >1186 -> 1165.  BNP 971  - Suspect this is an ischemic CM  - Developed low output HF and elevated volume status. Initial co-ox 44% CVP 21 (03/29/23) -- Given Scr is starting to level out > 2.0 and no current angina will defer cath this admission. -6/7 Milrinone stopped. CO-OX 58% today. CVP trending back up to 19. Will need to restart IV lasix 80 mg twice a day. Daily BMET.  -  Not candidate for ARNI/MRA with kidney injury at this point - Hold off on SGLT2. - Continue hydralazine 25 mg tid + imdur 30 mg daily   2. NSTEMI - echo as above with RWMA - no longer having CP - continue ASA/statin - Creatinine trending up  is starting to level out > 2.0 and no current angina will defer cath this admission.  3. AKI on CKD 3a - likely ATN/cardiorenal  - Scr 1.5 -> 2.8 -> 2.7 -> 2.4 -> 2.1 -> 2.3 - 2.5>2.6 >3  - 6/6 renal u/s - no hydronephrosis.  - renal following  4. Acute hypoxic respiratory failure - likely due primarily to HF and pleural effusions on top of underlying COPD - s/p R thora on 6/3 with 700cc out  - 6/7 Bilateral pleural effsions.   5. PAF  - back in NSR now - Initially on amio drip. Now on amio po amio 200 mg twice a day - On eliquis 2.5  mg twice a day. Reduced dose with age and creatinine.   6. Hypervolemic hyponatremia - restrict FW, treat HF -  improving  7. Urinary retention  8. DNR/DNI/no dialysis - confirmed with patient and her daughter    Length of Stay: 48  Tonye Becket, NP  04/05/2023, 8:41 AM  Advanced Heart Failure Team Pager [phone] (M-F; 7a - 5p)  Please contact [facility] for night-coverage after hours (4p -7a ) and weekends on [email or web address]

## 2023-04-05 NOTE — Progress Notes (Signed)
[facility]  ROUNDING NOTE   Subjective:   [name] was admitted to [facility] on 03/25/2023 for COPD exacerbation [facility]) [J44.1] Chest pain [R07.9] Community acquired pneumonia, unspecified laterality [J18.9]  Off milrinone  No acute c/o  Foley catheter has been removed but requiring I/O cath intermittently Serum creatinine is higher today 2.97 (2.63) (2.50)(2.31) Potassium is in normal range  Objective:  Vital signs in last 24 hours:  Temp:  [97.9 F (36.6 C)-98.2 F (36.8 C)] 97.9 F (36.6 C) (06/10 1200) Pulse Rate:  [48-105] 64 (06/10 1400) Resp:  [12-27] 18 (06/10 1400) BP: (108-185)/(39-141) 124/42 (06/10 1400) SpO2:  [89 %-100 %] 97 % (06/10 1400) FiO2 (%):  [24 %] 24 % (06/09 2058) Weight:  [61.3 kg] 61.3 kg (06/10 0500)  Weight change: 2.3 kg Filed Weights   04/03/23 0718 04/04/23 0714 04/05/23 0500  Weight: 59.5 kg 61.8 kg 61.3 kg    Intake/Output: I/O last 3 completed shifts: In: 1130 [P.O.:1130] Out: 720 [Urine:720]   Intake/Output this shift:  Total I/O In: 920 [P.O.:920] Out: -   Physical Exam: General: NAD, laying in bed  Head: Normocephalic, atraumatic. Moist oral mucosal membranes  Eyes: Anicteric  Lungs:  Mild  wheezing  Heart: regular  Abdomen:  Soft, nontender,   Extremities:  Trace peripheral edema.  Neurologic: Nonfocal, moving all four extremities  Skin: warm        Basic Metabolic Panel: Recent Labs  Lab 03/30/23 0345 03/30/23 1423 03/31/23 0446 03/31/23 1446 04/01/23 0503 04/02/23 0406 04/03/23 0505 04/04/23 0544 04/05/23 0847  NA 127*   < > 130*   < > 132* 130* 129* 131* 130*  K 3.6   < > 3.0*   < > 3.6 4.9 5.3* 4.8 4.2  CL 96*   < > 98   < > 99 99 97* 100 98  CO2 21*   < > 22   < > 22 22 21* 23 20*  GLUCOSE 224*   < > 125*   < > 114* 179* 94 68* 87  BUN 92*   < > 79*   < > 77* 72* 69* 70* 68*  CREATININE 2.69*   < > 2.42*   < > 2.16* 2.31* 2.50* 2.63* 2.97*  CALCIUM 7.5*   < > 7.9*   < > 8.0* 8.1*  8.5* 8.6* 8.4*  MG 2.2  --  2.2  --  2.0 1.9 2.1  --   --    < > = values in this interval not displayed.     Liver Function Tests: Recent Labs  Lab 03/29/23 1612  PROT 5.3*    No results for input(s): "LIPASE", "AMYLASE" in the last 168 hours. No results for input(s): "AMMONIA" in the last 168 hours.  CBC: Recent Labs  Lab 03/31/23 0446 04/01/23 0503 04/02/23 0406 04/03/23 0505 04/04/23 0544  WBC 10.2 11.0* 14.3* 11.7* 11.7*  HGB 8.5* 8.1* 8.3* 8.6* 8.2*  HCT 25.3* 25.1* 26.2* 26.4* 26.0*  MCV 86.9 88.1 88.8 88.9 90.6  PLT 196 205 215 204 201     Cardiac Enzymes: No results for input(s): "CKTOTAL", "CKMB", "CKMBINDEX", "TROPONINI" in the last 168 hours.  BNP: Invalid input(s): "POCBNP"  CBG: Recent Labs  Lab 04/04/23 1140 04/04/23 1602 04/04/23 2107 04/05/23 0754 04/05/23 1143  GLUCAP 116* 159* 130* 90 173*     Microbiology: Results for orders placed or performed during the hospital encounter of 03/25/23  Blood Culture (routine x 2)  Status: None   Collection Time: 03/25/23 12:09 PM   Specimen: BLOOD  Result Value Ref Range Status   Specimen Description BLOOD LEFT [facility]  Final   Special Requests   Final    BOTTLES DRAWN AEROBIC AND ANAEROBIC Blood Culture adequate volume   Culture   Final    NO GROWTH 5 DAYS Performed at [facility], [address] [place], [place] [address]    Report Status 03/30/2023 FINAL  Final  Blood Culture (routine x 2)     Status: None   Collection Time: 03/25/23 12:10 PM   Specimen: BLOOD  Result Value Ref Range Status   Specimen Description BLOOD LEFT FA  Final   Special Requests   Final    BOTTLES DRAWN AEROBIC AND ANAEROBIC Blood Culture adequate volume   Culture   Final    NO GROWTH 5 DAYS Performed at [facility], [address], [place], [place] [address]    Report Status 03/30/2023 FINAL  Final  SARS Coronavirus 2 by RT PCR (hospital order, performed in [facility] hospital lab)  *cepheid single result test* Anterior Nasal Swab     Status: None   Collection Time: 03/25/23  1:18 PM   Specimen: Anterior Nasal Swab  Result Value Ref Range Status   SARS Coronavirus 2 by RT PCR NEGATIVE NEGATIVE Final    Comment: (NOTE) SARS-CoV-2 target nucleic acids are NOT DETECTED.  The SARS-CoV-2 RNA is generally detectable in upper and lower respiratory specimens during the acute phase of infection. The lowest concentration of SARS-CoV-2 viral copies this assay can detect is 250 copies / mL. A negative result does not preclude SARS-CoV-2 infection and should not be used as the sole basis for treatment or other patient management decisions.  A negative result may occur with improper specimen collection / handling, submission of specimen other than nasopharyngeal swab, presence of viral mutation(s) within the areas targeted by this assay, and inadequate number of viral copies (<250 copies / mL). A negative result must be combined with clinical observations, patient history, and epidemiological information.  Fact Sheet for Patients:   RoadLapTop.co.za  Fact Sheet for Healthcare Providers: http://kim-miller.com/  This test is not yet approved or  cleared by the [place] FDA and has been authorized for detection and/or diagnosis of SARS-CoV-2 by FDA under an Emergency Use Authorization (EUA).  This EUA will remain in effect (meaning this test can be used) for the duration of the COVID-19 declaration under Section 564(b)(1) of the Act, 21 U.S.C. section 360bbb-3(b)(1), unless the authorization is terminated or revoked sooner.  Performed at [facility], [address] [place], [place] [address]   MRSA Next Gen by PCR, Nasal     Status: None   Collection Time: 03/27/23  4:40 AM   Specimen: Nasal Mucosa; Nasal Swab  Result Value Ref Range Status   MRSA by PCR Next Gen NOT DETECTED NOT DETECTED Final    Comment:  (NOTE) The GeneXpert MRSA Assay (FDA approved for NASAL specimens only), is one component of a comprehensive MRSA colonization surveillance program. It is not intended to diagnose MRSA infection nor to guide or monitor treatment for MRSA infections. Test performance is not FDA approved in patients less than 87 years old. Performed at [facility], [address] [place], [place] [address]   Respiratory (~20 pathogens) panel by PCR     Status: None   Collection Time: 03/28/23  4:00 PM   Specimen: Nasopharyngeal Swab; Respiratory  Result Value Ref Range Status   Adenovirus NOT DETECTED NOT DETECTED Final   Coronavirus 229E NOT DETECTED NOT DETECTED Final    Comment: (NOTE) The Coronavirus on the Respiratory Panel, DOES NOT test for the novel  Coronavirus (2019 nCoV)    Coronavirus HKU1 NOT DETECTED NOT DETECTED Final   Coronavirus NL63 NOT DETECTED NOT DETECTED Final   Coronavirus OC43 NOT DETECTED NOT DETECTED Final   Metapneumovirus NOT DETECTED NOT DETECTED Final   Rhinovirus / Enterovirus NOT DETECTED NOT DETECTED Final   Influenza A NOT DETECTED NOT DETECTED Final   Influenza B NOT DETECTED NOT DETECTED Final   Parainfluenza Virus 1 NOT DETECTED NOT DETECTED Final   Parainfluenza Virus 2 NOT DETECTED NOT DETECTED Final   Parainfluenza Virus 3 NOT DETECTED NOT DETECTED Final   Parainfluenza Virus 4 NOT DETECTED NOT DETECTED Final   Respiratory Syncytial Virus NOT DETECTED NOT DETECTED Final   Bordetella pertussis NOT DETECTED NOT DETECTED Final   Bordetella Parapertussis NOT DETECTED NOT DETECTED Final   Chlamydophila pneumoniae NOT DETECTED NOT DETECTED Final   Mycoplasma pneumoniae NOT DETECTED NOT DETECTED Final    Comment: Performed at [facility], [address], [place], [place] [address]  Body fluid culture w Gram Stain     Status: None   Collection Time: 03/29/23 10:27 AM   Specimen: PATH Cytology Pleural fluid  Result Value Ref Range Status    Specimen Description   Final    PLEURAL Performed at [facility], [address] [place], [place] [address]    Special Requests   Final    NONE Performed at [facility], [address] [place], [place] [address]    Gram Stain NO WBC SEEN NO ORGANISMS SEEN   Final   Culture   Final    NO GROWTH 3 DAYS Performed at [facility], [address], [place], [place] [address]    Report Status 04/01/2023 FINAL  Final  MRSA Next Gen by PCR, Nasal     Status: None   Collection Time: 03/29/23  3:00 PM   Specimen: Nasal Mucosa; Nasal Swab  Result Value Ref Range Status   MRSA by PCR Next Gen NOT DETECTED NOT DETECTED Final    Comment: (NOTE) The GeneXpert MRSA Assay (FDA approved for NASAL specimens only), is one component of a comprehensive MRSA colonization surveillance program. It is not intended to diagnose MRSA infection nor to guide or monitor treatment for MRSA infections. Test performance is not FDA approved in patients less than 26 years old. Performed at [facility], [address] [place], [place] [address]   Group A Strep by PCR     Status: None   Collection Time: 03/30/23  6:00 PM   Specimen: Throat; Sterile Swab  Result Value Ref Range Status   Group A Strep by PCR NOT DETECTED NOT DETECTED Final    Comment: Performed at [facility], [address] [place], [place] [address]    Coagulation Studies: No results for input(s): "LABPROT", "INR" in the last 72 hours.   Urinalysis: No results for input(s): "COLORURINE", "LABSPEC", "PHURINE", "GLUCOSEU", "HGBUR", "BILIRUBINUR", "KETONESUR", "PROTEINUR", "UROBILINOGEN", "NITRITE", "LEUKOCYTESUR" in the last 72 hours.  Invalid input(s): "APPERANCEUR"    Imaging: No results found.   Medications:      amiodarone  200 mg Oral BID   apixaban  2.5 mg Oral BID   arformoterol  15 mcg Nebulization BID   aspirin EC  81  mg Oral Daily   carvedilol  3.125 mg Oral BID WC    Chlorhexidine Gluconate Cloth  6 each Topical Daily   cyanocobalamin  1,000 mcg Oral Daily   feeding supplement (NEPRO CARB STEADY)  237 mL Oral BID BM   furosemide  80 mg Intravenous BID   gabapentin  100 mg Oral QHS   hydrALAZINE  25 mg Oral Q8H   insulin aspart  0-15 Units Subcutaneous TID WC   insulin aspart  0-5 Units Subcutaneous QHS   insulin glargine-yfgn  8 Units Subcutaneous QHS   isosorbide mononitrate  30 mg Oral Daily   multivitamin  1 tablet Oral BID   nystatin   Topical TID   polyethylene glycol  17 g Oral Daily   promethazine  6.25 mg Oral QHS   revefenacin  175 mcg Nebulization Daily   rosuvastatin  10 mg Oral Once per day on Mon Wed Fri Sat   acetaminophen, alum & mag hydroxide-simeth, dextromethorphan-guaiFENesin, hydrALAZINE, levalbuterol, melatonin, ondansetron (ZOFRAN) IV, mouth rinse, oxyCODONE, phenol, senna-docusate, traZODone  Assessment/ Plan:  [name] is a 88 y.o.  female with diabetes mellitus type II, hypertension, coronary artery disease, peripheral arterial disease, tremor, CVA, congestive heart failure , anxiety, peripheral arterial diease and anemia who is admitted to [facility] on 03/25/2023 for COPD exacerbation (HCC) [J44.1] Chest pain [R07.9] Community acquired pneumonia, unspecified laterality [J18.9]  Acute kidney injury with acute metabolic acidosis on chronic kidney disease stage IIIB: baseline creatinine of 1.48, GFR of 34 on 12/09/2022. Acute kidney injury secondary to acute cardiorenal syndrome . Chronic kidney disease secondary to hypertension.  Serum creatinine slightly worse today.   Maintain hemodynamic stability. Serial bladder scans. I/O cath as needed. Avoid hypotension/nonsteroidals/IV contrast Electrolytes and volume status are acceptable.  No acute indication for dialysis Discussed with patient and family briefly negative.  Renal function continues to worsen we may have to look into starting hemodialysis.  They will discuss  further.   Hypertension with chronic kidney disease: with chronic systolic and diastolic congestive heart failure.  Appreciate cardiology input.  Heart failure regimen includes isosorbide/hydralazine Patient has incontinence occasionally.  Hesitant to start SGLT2 inhibitor.  Hyponatremia: Sodium level has ranged between 129-132.  Continue to monitor closely.  4.  Hyperkalemia Likely secondary to AKI. K improved; ok to hold veltassa   LOS: 11 Emery Dupuy 6/10/20243:58 PM

## 2023-04-06 DIAGNOSIS — R570 Cardiogenic shock: Secondary | ICD-10-CM | POA: Diagnosis not present

## 2023-04-06 DIAGNOSIS — Z7189 Other specified counseling: Secondary | ICD-10-CM | POA: Diagnosis not present

## 2023-04-06 DIAGNOSIS — E871 Hypo-osmolality and hyponatremia: Secondary | ICD-10-CM | POA: Diagnosis not present

## 2023-04-06 DIAGNOSIS — N1832 Chronic kidney disease, stage 3b: Secondary | ICD-10-CM | POA: Diagnosis not present

## 2023-04-06 DIAGNOSIS — I214 Non-ST elevation (NSTEMI) myocardial infarction: Secondary | ICD-10-CM | POA: Diagnosis not present

## 2023-04-06 DIAGNOSIS — E1121 Type 2 diabetes mellitus with diabetic nephropathy: Secondary | ICD-10-CM | POA: Diagnosis not present

## 2023-04-06 DIAGNOSIS — E785 Hyperlipidemia, unspecified: Secondary | ICD-10-CM | POA: Diagnosis not present

## 2023-04-06 DIAGNOSIS — I129 Hypertensive chronic kidney disease with stage 1 through stage 4 chronic kidney disease, or unspecified chronic kidney disease: Secondary | ICD-10-CM | POA: Diagnosis not present

## 2023-04-06 DIAGNOSIS — I5023 Acute on chronic systolic (congestive) heart failure: Secondary | ICD-10-CM | POA: Diagnosis not present

## 2023-04-06 DIAGNOSIS — I10 Essential (primary) hypertension: Secondary | ICD-10-CM | POA: Diagnosis not present

## 2023-04-06 DIAGNOSIS — E876 Hypokalemia: Secondary | ICD-10-CM | POA: Diagnosis not present

## 2023-04-06 DIAGNOSIS — I480 Paroxysmal atrial fibrillation: Secondary | ICD-10-CM | POA: Diagnosis not present

## 2023-04-06 DIAGNOSIS — N179 Acute kidney failure, unspecified: Secondary | ICD-10-CM | POA: Diagnosis not present

## 2023-04-06 DIAGNOSIS — J90 Pleural effusion, not elsewhere classified: Secondary | ICD-10-CM | POA: Diagnosis not present

## 2023-04-06 LAB — COOXEMETRY PANEL
Carboxyhemoglobin: 1.5 % (ref 0.5–1.5)
Carboxyhemoglobin: 1.9 % — ABNORMAL HIGH (ref 0.5–1.5)
Methemoglobin: 0.8 % (ref 0.0–1.5)
O2 Saturation: 48.3 %
O2 Saturation: 59.6 %
Total hemoglobin: 9 g/dL — ABNORMAL LOW (ref 12.0–16.0)
Total hemoglobin: 9.3 g/dL — ABNORMAL LOW (ref 12.0–16.0)
Total oxygen content: 47.3 %
Total oxygen content: 58 %

## 2023-04-06 LAB — CBC
HCT: 28.2 % — ABNORMAL LOW (ref 36.0–46.0)
Hemoglobin: 8.9 g/dL — ABNORMAL LOW (ref 12.0–15.0)
MCH: 28.1 pg (ref 26.0–34.0)
MCHC: 31.6 g/dL (ref 30.0–36.0)
MCV: 89 fL (ref 80.0–100.0)
Platelets: 237 10*3/uL (ref 150–400)
RBC: 3.17 MIL/uL — ABNORMAL LOW (ref 3.87–5.11)
RDW: 13.7 % (ref 11.5–15.5)
WBC: 14.2 10*3/uL — ABNORMAL HIGH (ref 4.0–10.5)
nRBC: 0 % (ref 0.0–0.2)

## 2023-04-06 LAB — BASIC METABOLIC PANEL
Anion gap: 12 (ref 5–15)
BUN: 70 mg/dL — ABNORMAL HIGH (ref 8–23)
CO2: 21 mmol/L — ABNORMAL LOW (ref 22–32)
Calcium: 8.1 mg/dL — ABNORMAL LOW (ref 8.9–10.3)
Chloride: 98 mmol/L (ref 98–111)
Creatinine, Ser: 3.15 mg/dL — ABNORMAL HIGH (ref 0.44–1.00)
GFR, Estimated: 14 mL/min — ABNORMAL LOW (ref >60)
Glucose, Bld: 108 mg/dL — ABNORMAL HIGH (ref 70–99)
Potassium: 3.3 mmol/L — ABNORMAL LOW (ref 3.5–5.1)
Sodium: 131 mmol/L — ABNORMAL LOW (ref 135–145)

## 2023-04-06 LAB — GLUCOSE, CAPILLARY
Glucose-Capillary: 105 mg/dL — ABNORMAL HIGH (ref 70–99)
Glucose-Capillary: 230 mg/dL — ABNORMAL HIGH (ref 70–99)
Glucose-Capillary: 128 mg/dL — ABNORMAL HIGH (ref 70–99)
Glucose-Capillary: 125 mg/dL — ABNORMAL HIGH (ref 70–99)

## 2023-04-06 MED ORDER — POTASSIUM CHLORIDE CRYS ER 10 MEQ PO TBCR
10.0000 meq | EXTENDED_RELEASE_TABLET | Freq: Two times a day (BID) | ORAL | Status: DC
  Administered 2023-04-06 – 2023-04-10 (×8): 10 meq via ORAL
  Filled 2023-04-06 (×8): qty 1

## 2023-04-06 NOTE — Progress Notes (Signed)
[facility]  ROUNDING NOTE   Subjective:   [name] was admitted to [facility] on 03/25/2023 for COPD exacerbation [facility]) [J44.1] Chest pain [R07.9] Community acquired pneumonia, unspecified laterality [J18.9]  Off milrinone  No acute c/o  Foley catheter has been removed but requiring I/O cath intermittently Serum creatinine is higher today 3.15 (2.97) (2.63) (2.50)(2.31) Potassium is low  Objective:  Vital signs in last 24 hours:  Temp:  [97.6 F (36.4 C)-98.4 F (36.9 C)] 98 F (36.7 C) (06/11 1200) Pulse Rate:  [57-76] 58 (06/11 1400) Resp:  [12-25] 18 (06/11 1400) BP: (115-145)/(27-56) 122/48 (06/11 1400) SpO2:  [90 %-100 %] 99 % (06/11 1400) FiO2 (%):  [21 %] 21 % (06/10 2004) Weight:  [60.1 kg] 60.1 kg (06/11 0500)  Weight change: -1.7 kg Filed Weights   04/04/23 0714 04/05/23 0500 04/06/23 0500  Weight: 61.8 kg 61.3 kg 60.1 kg    Intake/Output: I/O last 3 completed shifts: In: 920 [P.O.:920] Out: -    Intake/Output this shift:  Total I/O In: 540 [P.O.:540] Out: 100 [Urine:100]  Physical Exam: General: NAD, laying in bed  Head: Normocephalic, atraumatic. Moist oral mucosal membranes  Eyes: Anicteric  Lungs:  Mild  wheezing  Heart: regular  Abdomen:  Soft, nontender,   Extremities:  Trace peripheral edema.  Neurologic: Nonfocal, moving all four extremities  Skin: warm        Basic Metabolic Panel: Recent Labs  Lab 03/31/23 0446 03/31/23 1446 04/01/23 0503 04/02/23 0406 04/03/23 0505 04/04/23 0544 04/05/23 0847 04/06/23 0536  NA 130*   < > 132* 130* 129* 131* 130* 131*  K 3.0*   < > 3.6 4.9 5.3* 4.8 4.2 3.3*  CL 98   < > 99 99 97* 100 98 98  CO2 22   < > 22 22 21* 23 20* 21*  GLUCOSE 125*   < > 114* 179* 94 68* 87 108*  BUN 79*   < > 77* 72* 69* 70* 68* 70*  CREATININE 2.42*   < > 2.16* 2.31* 2.50* 2.63* 2.97* 3.15*  CALCIUM 7.9*   < > 8.0* 8.1* 8.5* 8.6* 8.4* 8.1*  MG 2.2  --  2.0 1.9 2.1  --   --   --    < > = values in  this interval not displayed.     Liver Function Tests: No results for input(s): "AST", "ALT", "ALKPHOS", "BILITOT", "PROT", "ALBUMIN" in the last 168 hours.  No results for input(s): "LIPASE", "AMYLASE" in the last 168 hours. No results for input(s): "AMMONIA" in the last 168 hours.  CBC: Recent Labs  Lab 04/01/23 0503 04/02/23 0406 04/03/23 0505 04/04/23 0544 04/06/23 0536  WBC 11.0* 14.3* 11.7* 11.7* 14.2*  HGB 8.1* 8.3* 8.6* 8.2* 8.9*  HCT 25.1* 26.2* 26.4* 26.0* 28.2*  MCV 88.1 88.8 88.9 90.6 89.0  PLT 205 215 204 201 237     Cardiac Enzymes: No results for input(s): "CKTOTAL", "CKMB", "CKMBINDEX", "TROPONINI" in the last 168 hours.  BNP: Invalid input(s): "POCBNP"  CBG: Recent Labs  Lab 04/05/23 1143 04/05/23 1701 04/05/23 2116 04/06/23 0726 04/06/23 1127  GLUCAP 173* 144* 114* 105* 230*     Microbiology: Results for orders placed or performed during the hospital encounter of 03/25/23  Blood Culture (routine x 2)     Status: None   Collection Time: 03/25/23 12:09 PM   Specimen: BLOOD  Result Value Ref Range Status   Specimen Description BLOOD LEFT [facility]  Final   Special  Requests   Final    BOTTLES DRAWN AEROBIC AND ANAEROBIC Blood Culture adequate volume   Culture   Final    NO GROWTH 5 DAYS Performed at [facility], [address] [place] [place], [place] [address]    Report Status 03/30/2023 FINAL  Final  Blood Culture (routine x 2)     Status: None   Collection Time: 03/25/23 12:10 PM   Specimen: BLOOD  Result Value Ref Range Status   Specimen Description BLOOD LEFT FA  Final   Special Requests   Final    BOTTLES DRAWN AEROBIC AND ANAEROBIC Blood Culture adequate volume   Culture   Final    NO GROWTH 5 DAYS Performed at [facility], [address] [place], [place] [address]    Report Status 03/30/2023 FINAL  Final  SARS Coronavirus 2 by RT PCR (hospital order, performed in [facility] hospital lab) *cepheid single result test*  Anterior Nasal Swab     Status: None   Collection Time: 03/25/23  1:18 PM   Specimen: Anterior Nasal Swab  Result Value Ref Range Status   SARS Coronavirus 2 by RT PCR NEGATIVE NEGATIVE Final    Comment: (NOTE) SARS-CoV-2 target nucleic acids are NOT DETECTED.  The SARS-CoV-2 RNA is generally detectable in upper and lower respiratory specimens during the acute phase of infection. The lowest concentration of SARS-CoV-2 viral copies this assay can detect is 250 copies / mL. A negative result does not preclude SARS-CoV-2 infection and should not be used as the sole basis for treatment or other patient management decisions.  A negative result may occur with improper specimen collection / handling, submission of specimen other than nasopharyngeal swab, presence of viral mutation(s) within the areas targeted by this assay, and inadequate number of viral copies (<250 copies / mL). A negative result must be combined with clinical observations, patient history, and epidemiological information.  Fact Sheet for Patients:   RoadLapTop.co.za  Fact Sheet for Healthcare Providers: http://kim-miller.com/  This test is not yet approved or  cleared by the [place] FDA and has been authorized for detection and/or diagnosis of SARS-CoV-2 by FDA under an Emergency Use Authorization (EUA).  This EUA will remain in effect (meaning this test can be used) for the duration of the COVID-19 declaration under Section 564(b)(1) of the Act, 21 U.S.C. section 360bbb-3(b)(1), unless the authorization is terminated or revoked sooner.  Performed at [facility], [address] [place], [place] [address]   MRSA Next Gen by PCR, Nasal     Status: None   Collection Time: 03/27/23  4:40 AM   Specimen: Nasal Mucosa; Nasal Swab  Result Value Ref Range Status   MRSA by PCR Next Gen NOT DETECTED NOT DETECTED Final    Comment: (NOTE) The GeneXpert MRSA Assay  (FDA approved for NASAL specimens only), is one component of a comprehensive MRSA colonization surveillance program. It is not intended to diagnose MRSA infection nor to guide or monitor treatment for MRSA infections. Test performance is not FDA approved in patients less than 66 years old. Performed at [facility], [address] [place], [place] [address]   Respiratory (~20 pathogens) panel by PCR     Status: None   Collection Time: 03/28/23  4:00 PM   Specimen: Nasopharyngeal Swab; Respiratory  Result Value Ref Range Status   Adenovirus NOT DETECTED NOT DETECTED Final   Coronavirus 229E NOT DETECTED NOT DETECTED Final    Comment: (NOTE) The Coronavirus on the  Respiratory Panel, DOES NOT test for the novel  Coronavirus (2019 nCoV)    Coronavirus HKU1 NOT DETECTED NOT DETECTED Final   Coronavirus NL63 NOT DETECTED NOT DETECTED Final   Coronavirus OC43 NOT DETECTED NOT DETECTED Final   Metapneumovirus NOT DETECTED NOT DETECTED Final   Rhinovirus / Enterovirus NOT DETECTED NOT DETECTED Final   Influenza A NOT DETECTED NOT DETECTED Final   Influenza B NOT DETECTED NOT DETECTED Final   Parainfluenza Virus 1 NOT DETECTED NOT DETECTED Final   Parainfluenza Virus 2 NOT DETECTED NOT DETECTED Final   Parainfluenza Virus 3 NOT DETECTED NOT DETECTED Final   Parainfluenza Virus 4 NOT DETECTED NOT DETECTED Final   Respiratory Syncytial Virus NOT DETECTED NOT DETECTED Final   Bordetella pertussis NOT DETECTED NOT DETECTED Final   Bordetella Parapertussis NOT DETECTED NOT DETECTED Final   Chlamydophila pneumoniae NOT DETECTED NOT DETECTED Final   Mycoplasma pneumoniae NOT DETECTED NOT DETECTED Final    Comment: Performed at [facility], [address], [place], [place] [address]  Body fluid culture w Gram Stain     Status: None   Collection Time: 03/29/23 10:27 AM   Specimen: PATH Cytology Pleural fluid  Result Value Ref Range Status   Specimen Description   Final     PLEURAL Performed at [facility], [address] [place], [place] [address]    Special Requests   Final    NONE Performed at [facility], [address] [place], [place] [address]    Gram Stain NO WBC SEEN NO ORGANISMS SEEN   Final   Culture   Final    NO GROWTH 3 DAYS Performed at [facility], [address] [place], [place] [address]    Report Status 04/01/2023 FINAL  Final  MRSA Next Gen by PCR, Nasal     Status: None   Collection Time: 03/29/23  3:00 PM   Specimen: Nasal Mucosa; Nasal Swab  Result Value Ref Range Status   MRSA by PCR Next Gen NOT DETECTED NOT DETECTED Final    Comment: (NOTE) The GeneXpert MRSA Assay (FDA approved for NASAL specimens only), is one component of a comprehensive MRSA colonization surveillance program. It is not intended to diagnose MRSA infection nor to guide or monitor treatment for MRSA infections. Test performance is not FDA approved in patients less than 17 years old. Performed at [facility], [address] [place], [place] [address]   Group A Strep by PCR     Status: None   Collection Time: 03/30/23  6:00 PM   Specimen: Throat; Sterile Swab  Result Value Ref Range Status   Group A Strep by PCR NOT DETECTED NOT DETECTED Final    Comment: Performed at [facility], [address] [place], [place] [address]    Coagulation Studies: No results for input(s): "LABPROT", "INR" in the last 72 hours.   Urinalysis: No results for input(s): "COLORURINE", "LABSPEC", "PHURINE", "GLUCOSEU", "HGBUR", "BILIRUBINUR", "KETONESUR", "PROTEINUR", "UROBILINOGEN", "NITRITE", "LEUKOCYTESUR" in the last 72 hours.  Invalid input(s): "APPERANCEUR"    Imaging: No results found.   Medications:      amiodarone  200 mg Oral BID   apixaban  2.5 mg Oral BID   arformoterol  15 mcg Nebulization BID   aspirin EC  81 mg Oral Daily   carvedilol  3.125 mg Oral BID WC   Chlorhexidine Gluconate Cloth  6  each Topical Daily   cyanocobalamin  1,000 mcg Oral Daily  feeding supplement (NEPRO CARB STEADY)  237 mL Oral BID BM   furosemide  80 mg Intravenous BID   gabapentin  100 mg Oral QHS   hydrALAZINE  25 mg Oral Q8H   insulin aspart  0-15 Units Subcutaneous TID WC   insulin aspart  0-5 Units Subcutaneous QHS   insulin glargine-yfgn  8 Units Subcutaneous QHS   isosorbide mononitrate  30 mg Oral Daily   multivitamin  1 tablet Oral BID   nystatin   Topical TID   polyethylene glycol  17 g Oral Daily   promethazine  6.25 mg Oral QHS   revefenacin  175 mcg Nebulization Daily   rosuvastatin  10 mg Oral Once per day on Mon Wed Fri Sat   acetaminophen, alum & mag hydroxide-simeth, dextromethorphan-guaiFENesin, hydrALAZINE, levalbuterol, melatonin, ondansetron (ZOFRAN) IV, mouth rinse, oxyCODONE, phenol, senna-docusate, traZODone  Assessment/ Plan:  [name] is a 88 y.o.  female with diabetes mellitus type II, hypertension, coronary artery disease, peripheral arterial disease, tremor, CVA, congestive heart failure , anxiety, peripheral arterial diease and anemia who is admitted to [facility] on 03/25/2023 for COPD exacerbation (HCC) [J44.1] Chest pain [R07.9] Community acquired pneumonia, unspecified laterality [J18.9]  Acute kidney injury with acute metabolic acidosis on chronic kidney disease stage IIIB: baseline creatinine of 1.48, GFR of 34 on 12/09/2022. Acute kidney injury secondary to acute cardiorenal syndrome . Chronic kidney disease secondary to hypertension.  Serum creatinine slightly worse again today.   Maintain hemodynamic stability. Serial bladder scans. I/O cath as needed. Avoid hypotension/nonsteroidals/IV contrast Electrolytes and volume status are acceptable.  No acute indication for dialysis Palliative care on board.  Due to patient's various comorbidities including CAD, CHF, she may not be able to tolerate dialysis very well.  She would not make a good outpatient dialysis  candidate.   Hypertension with chronic kidney disease: with chronic systolic and diastolic congestive heart failure.  Appreciate cardiology input.  Heart failure regimen includes isosorbide/hydralazine Patient has incontinence occasionally.  Hesitant to start SGLT2 inhibitor.  Hyponatremia: Sodium level has ranged between 129-132.  Continue to monitor closely.  4.  Hypokalemia K is now low. K replacement as per Primary team.   LOS: 12 Kynisha Memon 6/11/20243:11 PM

## 2023-04-06 NOTE — Progress Notes (Signed)
Daily Progress Note   Patient Name: Jill Hudson       Date: 04/06/2023 DOB: [birth year]  Age: 88 y.o. MRN#: [id number] Attending Physician: Alford Highland, MD Primary Care Physician: Reubin Milan, MD Admit Date: 03/25/2023  Reason for Consultation/Follow-up: Establishing goals of care  Subjective: Notes and labs reviewed.  In to see patient.  No family at bedside.  Patient has been kept updated and is able to articulate her status, and conversation with nephrology.  Patient tells me that she was able to spend time with her son who came to visit over the weekend.  She shares with me that she understands she cannot live forever.  She states she understands that her time may be limited, and she is okay with this as she knows where she will go when she leaves this earth.  She states she is hopeful that her children are prepared for this, and believes her daughter is.  Discussed that I will follow-up again tomorrow to speak with her when her family is present.  Length of Stay: 12  Current Medications: Scheduled Meds:   amiodarone  200 mg Oral BID   apixaban  2.5 mg Oral BID   arformoterol  15 mcg Nebulization BID   aspirin EC  81 mg Oral Daily   carvedilol  3.125 mg Oral BID WC   Chlorhexidine Gluconate Cloth  6 each Topical Daily   cyanocobalamin  1,000 mcg Oral Daily   feeding supplement (NEPRO CARB STEADY)  237 mL Oral BID BM   furosemide  80 mg Intravenous BID   gabapentin  100 mg Oral QHS   hydrALAZINE  25 mg Oral Q8H   insulin aspart  0-15 Units Subcutaneous TID WC   insulin aspart  0-5 Units Subcutaneous QHS   insulin glargine-yfgn  8 Units Subcutaneous QHS   isosorbide mononitrate  30 mg Oral Daily   multivitamin  1 tablet Oral BID   nystatin   Topical TID   polyethylene  glycol  17 g Oral Daily   promethazine  6.25 mg Oral QHS   revefenacin  175 mcg Nebulization Daily   rosuvastatin  10 mg Oral Once per day on Mon Wed Fri Sat    Continuous Infusions:   PRN Meds: acetaminophen, alum & mag hydroxide-simeth, dextromethorphan-guaiFENesin, hydrALAZINE, levalbuterol, melatonin, ondansetron (ZOFRAN)  IV, mouth rinse, oxyCODONE, phenol, senna-docusate, traZODone  Physical Exam Pulmonary:     Effort: Pulmonary effort is normal.  Neurological:     Mental Status: She is alert.             Vital Signs: BP (!) 122/48   Pulse (!) 58   Temp 98 F (36.7 C) (Oral)   Resp 18   Ht 5' (1.524 m)   Wt 60.1 kg   SpO2 99%   BMI 25.88 kg/m  SpO2: SpO2: 99 % O2 Device: O2 Device: Nasal Cannula O2 Flow Rate: O2 Flow Rate (L/min): 1 L/min  Intake/output summary:  Intake/Output Summary (Last 24 hours) at 04/06/2023 1511 Last data filed at 04/06/2023 1200 Gross per 24 hour  Intake 540 ml  Output 100 ml  Net 440 ml   LBM: Last BM Date : 04/04/23 Baseline Weight: Weight: 56.2 kg Most recent weight: Weight: 60.1 kg    Patient Active Problem List   Diagnosis Date Noted   Acute urinary retention 04/04/2023   Community acquired pneumonia 04/03/2023   Hyperkalemia 04/03/2023   Pleural effusion 04/02/2023   Acute on chronic systolic CHF (congestive heart failure) (HCC) 03/31/2023   Paroxysmal atrial fibrillation (HCC) 03/31/2023   Hypokalemia 03/31/2023   Cardiogenic shock (HCC) 03/28/2023   COPD exacerbation (HCC) 03/25/2023   HLD (hyperlipidemia) 03/25/2023   Type II diabetes mellitus with renal manifestations (HCC) 03/25/2023   CAD (coronary artery disease) 03/25/2023   Iron deficiency anemia 03/25/2023   PAD (peripheral artery disease) (HCC) 03/25/2023   Acquired thrombophilia (HCC) 03/20/2021   Mood disorder (HCC) 05/02/2020   Chronic heart failure with preserved ejection fraction (HCC) 04/12/2020   Diverticulosis of large intestine without perforation  or abscess with bleeding 03/27/2020   COPD (chronic obstructive pulmonary disease) with chronic bronchitis    NSTEMI (non-ST elevated myocardial infarction) (HCC)    Acute respiratory failure with hypoxia (HCC) 02/18/2020   CKD (chronic kidney disease), stage IIIa 02/18/2020   Hyponatremia 02/18/2020   Malnutrition of mild degree (HCC) 01/08/2020   Acute kidney injury superimposed on CKD (HCC)    Age-related macular degeneration, dry, left eye 06/21/2019   Age-related macular degeneration, wet, right eye (HCC) 06/21/2019   Moderate nonproliferative diabetic retinopathy associated with type 2 diabetes mellitus (HCC) 06/21/2019   Lumbosacral radiculopathy at L4 05/01/2019   Underweight 05/01/2019   Encounter for long-term (current) use of aspirin 10/31/2018   Long term current use of oral hypoglycemic drug 10/31/2018   Encounter for long-term (current) use of insulin (HCC) 10/31/2018   Peptic ulcer disease 08/11/2018   Leg pain 07/03/2017   Carpal tunnel syndrome on both sides 05/05/2017   Myalgia due to HMG CoA reductase inhibitor 05/05/2017   History of CVA (cerebrovascular accident) 02/15/2017   Degenerative disc disease, lumbar 12/21/2016   Atherosclerosis of native arteries of extremity with intermittent claudication (HCC) 12/20/2016   Bilateral carotid artery stenosis 12/20/2016   Occlusion and stenosis of bilateral carotid arteries 12/20/2016   Hip bursitis 05/18/2016   Elevated TSH 01/18/2016   CKD stage 3 due to type 2 diabetes mellitus (HCC) 01/16/2016   Senile ecchymosis 01/16/2016   Type II diabetes mellitus with complication (HCC)    GERD (gastroesophageal reflux disease) 07/24/2015   PVD (peripheral vascular disease) (HCC) 05/17/2015   Neoplasm of uncertain behavior of skin 05/17/2015   Retinopathy, diabetic, proliferative (HCC) 04/11/2015   Hyperlipidemia associated with type 2 diabetes mellitus (HCC) 04/11/2015   Essential hypertension 04/11/2015   Generalized OA  04/11/2015   Proliferative diabetic retinopathy(362.02) 04/11/2015   Arteriosclerosis of coronary artery 05/29/2013   Hypertensive heart disease without CHF 05/29/2013    Palliative Care Assessment & Plan    Recommendations/Plan: PMT will follow-up tomorrow.  Code Status:    Code Status Orders  (From admission, onward)           Start     Ordered   03/31/23 1502  Do not attempt resuscitation (DNR)  Continuous       Question Answer Comment  If patient has no pulse and is not breathing Do Not Attempt Resuscitation   If patient has a pulse and/or is breathing: Medical Treatment Goals LIMITED ADDITIONAL INTERVENTIONS: Use medication/IV fluids and cardiac monitoring as indicated; Do not use intubation or mechanical ventilation (DNI), also provide comfort medications.  Transfer to Progressive/Stepdown as indicated, avoid Intensive Care.   Consent: Discussion documented in EHR or advanced directives reviewed      03/31/23 1501           Code Status History     Date Active Date Inactive Code Status Order ID Comments User Context   03/25/2023 1214 03/31/2023 1501 Full Code [id number]  Lorretta Harp, MD ED   02/18/2020 1059 02/26/2020 2310 Full Code [id number]  Lorretta Harp, MD ED   12/25/2019 0555 12/28/2019 2204 Full Code [id number]  Mansy, Vernetta Honey, MD ED   10/12/2018 1647 10/15/2018 1808 Full Code [id number]  Schnier, Latina Craver, MD Inpatient   09/16/2018 1254 09/16/2018 2048 Full Code [id number]  Gilda Crease, Latina Craver, MD Inpatient   08/11/2018 1706 08/13/2018 2136 Full Code [id number]  Bertrum Sol, MD Inpatient   07/26/2018 0921 07/26/2018 1652 Full Code [id number]  Renford Dills, MD Inpatient   08/23/2017 0935 08/23/2017 1419 Full Code [id number]  Annice Needy, MD Inpatient   02/15/2017 1538 02/16/2017 1959 Full Code [id number]  Auburn Bilberry, MD Inpatient       Prognosis:  < 6 months    Thank you for allowing the Palliative Medicine Team to assist in the care of this  patient.     Morton Stall, NP  Please contact Palliative Medicine Team phone at [phone] for questions and concerns.

## 2023-04-06 NOTE — Progress Notes (Signed)
Rounding Note    Patient Name: Jill Hudson Date of Encounter: 04/06/2023  [facility] Cardiologist: None   Subjective   Still with significant shortness of breath and dry cough.  No chest pain.  Inpatient Medications    Scheduled Meds:  amiodarone  200 mg Oral BID   apixaban  2.5 mg Oral BID   arformoterol  15 mcg Nebulization BID   aspirin EC  81 mg Oral Daily   carvedilol  3.125 mg Oral BID WC   Chlorhexidine Gluconate Cloth  6 each Topical Daily   cyanocobalamin  1,000 mcg Oral Daily   feeding supplement (NEPRO CARB STEADY)  237 mL Oral BID BM   furosemide  80 mg Intravenous BID   gabapentin  100 mg Oral QHS   hydrALAZINE  25 mg Oral Q8H   insulin aspart  0-15 Units Subcutaneous TID WC   insulin aspart  0-5 Units Subcutaneous QHS   insulin glargine-yfgn  8 Units Subcutaneous QHS   isosorbide mononitrate  30 mg Oral Daily   multivitamin  1 tablet Oral BID   nystatin   Topical TID   polyethylene glycol  17 g Oral Daily   promethazine  6.25 mg Oral QHS   revefenacin  175 mcg Nebulization Daily   rosuvastatin  10 mg Oral Once per day on Mon Wed Fri Sat   Continuous Infusions:  PRN Meds: acetaminophen, alum & mag hydroxide-simeth, dextromethorphan-guaiFENesin, hydrALAZINE, levalbuterol, melatonin, ondansetron (ZOFRAN) IV, mouth rinse, oxyCODONE, phenol, senna-docusate, traZODone   Vital Signs    Vitals:   04/06/23 0500 04/06/23 0600 04/06/23 0700 04/06/23 0800  BP: (!) 145/52 (!) 139/53 (!) 118/47 (!) 136/51  Pulse: 72 65 (!) 57 61  Resp: 18 18 14 17   Temp:    97.6 F (36.4 C)  TempSrc:    Oral  SpO2: 95% 98% 93% 91%  Weight: 60.1 kg     Height:        Intake/Output Summary (Last 24 hours) at 04/06/2023 0914 Last data filed at 04/06/2023 0830 Gross per 24 hour  Intake 790 ml  Output --  Net 790 ml      04/06/2023    5:00 AM 04/05/2023    5:00 AM 04/04/2023    7:14 AM  Last 3 Weights  Weight (lbs) 132 lb 7.9 oz 135 lb 2.3 oz 136 lb 3.9 oz   Weight (kg) 60.1 kg 61.3 kg 61.8 kg      Telemetry    Sinus rhythm- Personally Reviewed  ECG     - Personally Reviewed  Physical Exam   GEN: Frail looking elderly female Neck: mild  JVD Cardiac: RRR, no murmurs, rubs, or gallops.  Respiratory: Bilateral rhonchi and diminished breath sounds. GI: Soft, nontender, non-distended  MS: No edema; No deformity. Neuro:  Nonfocal  Psych: Normal affect   Labs    High Sensitivity Troponin:   Recent Labs  Lab 03/25/23 1857 03/25/23 2156 03/26/23 0444 03/26/23 1428 03/26/23 1711  TROPONINIHS 225* 458* 1,186* 1,165* 1,263*     Chemistry Recent Labs  Lab 04/01/23 0503 04/02/23 0406 04/03/23 0505 04/04/23 0544 04/05/23 0847 04/06/23 0536  NA 132* 130* 129* 131* 130* 131*  K 3.6 4.9 5.3* 4.8 4.2 3.3*  CL 99 99 97* 100 98 98  CO2 22 22 21* 23 20* 21*  GLUCOSE 114* 179* 94 68* 87 108*  BUN 77* 72* 69* 70* 68* 70*  CREATININE 2.16* 2.31* 2.50* 2.63* 2.97* 3.15*  CALCIUM 8.0* 8.1*  8.5* 8.6* 8.4* 8.1*  MG 2.0 1.9 2.1  --   --   --   GFRNONAA 22* 20* 18* 17* 15* 14*  ANIONGAP 11 9 11 8 12 12     Lipids No results for input(s): "CHOL", "TRIG", "HDL", "LABVLDL", "LDLCALC", "CHOLHDL" in the last 168 hours.  Hematology Recent Labs  Lab 04/03/23 0505 04/04/23 0544 04/06/23 0536  WBC 11.7* 11.7* 14.2*  RBC 2.97* 2.87* 3.17*  HGB 8.6* 8.2* 8.9*  HCT 26.4* 26.0* 28.2*  MCV 88.9 90.6 89.0  MCH 29.0 28.6 28.1  MCHC 32.6 31.5 31.6  RDW 13.8 13.8 13.7  PLT 204 201 237   Thyroid No results for input(s): "TSH", "FREET4" in the last 168 hours.  BNPNo results for input(s): "BNP", "PROBNP" in the last 168 hours.  DDimer No results for input(s): "DDIMER" in the last 168 hours.   Radiology    No results found.  Cardiac Studies   Echocardiogram on June 7 showed an EF of 35 to 40% with moderate to severe mitral regurgitation.  Patient Profile     88 y.o. female  with diabetes mellitus type II, hypertension, coronary artery  disease, peripheral arterial disease, tremor, CVA, congestive heart failure and anemia who is admitted to [facility] on 03/25/2023 for COPD exacerbation and acute on chronic systolic heart failure.  Assessment & Plan    1.  Acute on chronic systolic heart failure with cardiogenic shock: Previous ejection fraction of 55% and currently 35 to 40% with moderate to severe mitral regurgitation. She is still volume overloaded.  Continue IV furosemide 80 mg twice daily.  Slight worsening of renal function. She was treated with milrinone which was stopped over the weekend. Management options are somewhat limited by age, frailty and advanced chronic kidney disease.  Overall prognosis seems to be poor.  Advanced heart failure to resume care tomorrow.  2.  Non-ST elevation myocardial infarction: Possible underlying ischemic cardiomyopathy.  Left heart catheterization is deferred due to acute on chronic kidney disease.  3.  Acute on chronic kidney disease: Gradual decrease in renal function.  Nephrology following.  The patient is considering her options including dialysis.  4.  Paroxysmal atrial fibrillation: Currently maintaining sinus rhythm with amiodarone which should be continued at 200 mg once daily.  Continue anticoagulation with low-dose Eliquis 2.5 mg twice daily.  5.  Moderate to severe mitral regurgitation: Possibly ischemic in etiology.     For questions or updates, please contact [facility] Please consult [email or web address] for contact info under        Signed, Lorine Bears, MD  04/06/2023, 9:14 AM

## 2023-04-06 NOTE — Progress Notes (Signed)
Occupational Therapy Treatment Patient Details Name: Jill Hudson MRN: [id number] DOB: 12-May-1935 Today's Date: 04/06/2023   History of present illness presented to ER secondary to progressive SOB; admitted for management of A/C CHF exacerbation, AECOPD.  s/p thoracentesis (6/3) with removal of fluid; pending cardiac cath and TEE   OT comments  Pt seen for OT tx. Pt denies SOB, SpO2 down to 88% with return to bed, otherwise >92% with mobility. Min-MODA  for bed mobility and STS transfers EOB with RW. MAX A for sock mgt. MIN A for taking a few lateral steps along the EOB. Pt endorsed fatigue after this. Continued education in PLB. Pt continues to benefit from skilled OT services.    Recommendations for follow up therapy are one component of a multi-disciplinary discharge planning process, led by the attending physician.  Recommendations may be updated based on patient status, additional functional criteria and insurance authorization.    Assistance Recommended at Discharge Intermittent Supervision/Assistance  Patient can return home with the following  A little help with walking and/or transfers;A little help with bathing/dressing/bathroom;Assistance with cooking/housework;Assist for transportation;Help with stairs or ramp for entrance   Equipment Recommendations  BSC/3in1    Recommendations for Other Services      Precautions / Restrictions Precautions Precautions: Fall Restrictions Weight Bearing Restrictions: No       Mobility Bed Mobility Overal bed mobility: Needs Assistance Bed Mobility: Supine to Sit, Sit to Supine     Supine to sit: Min assist, HOB elevated Sit to supine: Mod assist, HOB elevated        Transfers Overall transfer level: Needs assistance Equipment used: Rolling walker (2 wheels) Transfers: Sit to/from Stand Sit to Stand: Min assist, Mod assist          Lateral/Scoot Transfers: Min assist General transfer comment: MIN A for lateral steps  along the EOB     Balance Overall balance assessment: Needs assistance Sitting-balance support: Feet supported Sitting balance-Leahy Scale: Fair   Postural control: Posterior lean Standing balance support: Bilateral upper extremity supported, During functional activity, Reliant on assistive device for balance Standing balance-Leahy Scale: Poor Standing balance comment: external support required with posterior lean                           ADL either performed or assessed with clinical judgement   ADL Overall ADL's : Needs assistance/impaired                     Lower Body Dressing: Sitting/lateral leans;Maximal assistance Lower Body Dressing Details (indicate cue type and reason): MAX A for doffing and donning socks                    Extremity/Trunk Assessment              Vision       Perception     Praxis      Cognition Arousal/Alertness: Awake/alert Behavior During Therapy: WFL for tasks assessed/performed Overall Cognitive Status: Within Functional Limits for tasks assessed                                          Exercises Other Exercises Other Exercises: Additional instruction in PLB    Shoulder Instructions       General Comments      Pertinent Vitals/ Pain  Pain Assessment Pain Assessment: No/denies pain  Home Living                                          Prior Functioning/Environment              Frequency  Min 2X/week        Progress Toward Goals  OT Goals(current goals can now be found in the care plan section)  Progress towards OT goals: Progressing toward goals  Acute Rehab OT Goals Patient Stated Goal: feel stronger and go home OT Goal Formulation: With patient Time For Goal Achievement: 04/12/23 Potential to Achieve Goals: Good  Plan Frequency remains appropriate;Discharge plan remains appropriate    Co-evaluation                 AM-PAC  OT "6 Clicks" Daily Activity     Outcome Measure   Help from another person eating meals?: None Help from another person taking care of personal grooming?: A Little Help from another person toileting, which includes using toliet, bedpan, or urinal?: A Little Help from another person bathing (including washing, rinsing, drying)?: A Lot Help from another person to put on and taking off regular upper body clothing?: A Little Help from another person to put on and taking off regular lower body clothing?: A Lot 6 Click Score: 17    End of Session Equipment Utilized During Treatment: Rolling walker (2 wheels);Gait belt;Oxygen  OT Visit Diagnosis: Unsteadiness on feet (R26.81);Muscle weakness (generalized) (M62.81);History of falling (Z91.81)   Activity Tolerance Patient tolerated treatment well   Patient Left in bed;with call bell/phone within reach;with bed alarm set   Nurse Communication          Time: 8119-1478 OT Time Calculation (min): 21 min  Charges: OT General Charges $OT Visit: 1 Visit OT Treatments $Self Care/Home Management : 8-22 mins  Arman Filter., MPH, MS, OTR/L ascom [phone] 04/06/23, 1:33 PM

## 2023-04-06 NOTE — Progress Notes (Signed)
Progress Note   Patient: Jill Hudson:[id number] DOB: May 20, 1935 DOA: 03/25/2023     12 DOS: the patient was seen and examined on 04/06/2023   Brief hospital course: 88 year old female with history of COPD, HTN, HLD, DM2, stroke, TIA, GERD, anxiety, PVD, CAD, MI, former tobacco use, CKD stage III, GI bleed, iron deficiency anemia, PAD status post PCI comes to the hospital with shortness of breath and hypoxia. Patient was found to have bilateral pulmonary edema /effusion with elevated BNP.  Patient is admitted for further evaluation.  Cardiology is consulted.  Patient will eventually need right and left heart cath pending improvement in renal functions.   6/5.  Patient feeling a little bit better than when she came in.  On heparin drip, milrinone drip, Lasix drip for cardiogenic shock.  Amiodarone drip added for atrial fibrillation. 6/6.  Now on torsemide instead of Lasix drip.  Creatinine down to 2.16 6/7.  Cardiology decided to discontinue milrinone drip.  Switching off amiodarone drip.  Converting heparin drip over to Eliquis.  Creatinine up to 2.31.  Patient in normal sinus rhythm. 6/8.  Creatinine up to 2.5 and potassium 5.3.  Lokelma ordered.  Potassium supplementation discontinued 6/9.  Creatinine up at 2.63.  Needed in and out catheterization this morning. 6/10.  Creatinine 2.97, transfer out of ICU to progressive care.  Urinating well.  Assessment and Plan: * Cardiogenic shock [facility] Cardiology took off milrinone drip 6/7.  Not on any pressors at this point.  Acute on chronic systolic CHF (congestive heart failure) (HCC) Patient switched to IV Lasix 80 mg twice a day, hydralazine, Imdur and low-dose Coreg.  No ACE/ARB or spironolactone with her worsening kidney function.  Paroxysmal atrial fibrillation (HCC) Patient converted to normal sinus rhythm.  Taken off amiodarone drip and switched to oral on 6/7.  Heparin drip converted over to Eliquis on 6/7.  Acute kidney injury  superimposed on CKD (HCC) AKI on CKD stage IIIa.  Creatinine now 3.15.  Continue to monitor.  Nephrology will continue to talk with patient and family about next steps.  NSTEMI (non-ST elevated myocardial infarction) (HCC) With creatinine above 2, medical management.  Continue aspirin, Imdur Coreg and low-dose Crestor.  Type II diabetes mellitus with renal manifestations (HCC) Last hemoglobin A1c 7.9.  Hypoglycemic episode the other morning.  Decreased Semglee insulin down to 8 units at night.  Continue sliding scale.  COPD exacerbation (HCC) Continue nebulizers.  Essential hypertension Continue hydralazine, Imdur and low-dose Coreg.  History of CVA (cerebrovascular accident) Patient on Eliquis for stroke prevention  HLD (hyperlipidemia) On Crestor  Hyponatremia Sodium 131.  Iron deficiency anemia Last hemoglobin 8.9.  Continue to monitor.  Acute urinary retention Patient required in and out catheterization this morning.  Continue to monitor.  Hyperkalemia Previously treated.  Pleural effusion Right thoracentesis removed 700 mL of fluid.  This was done by interventional radiology on 6/3.  Hypokalemia Today with hypokalemia.  Since patient back on IV Lasix will give some potassium supplementation.  Acute respiratory failure with hypoxia (HCC) Patient initially requiring BiPAP secondary to respiratory distress.  Currently on 2 L.        Subjective: Patient states that she is urinating well.  Creatinine continues to rise.  Breathing a little bit better.  Admitted with heart failure and NSTEMI.  Physical Exam: Vitals:   04/06/23 1400 04/06/23 1500 04/06/23 1600 04/06/23 1700  BP: (!) 122/48 (!) 104/91 (!) 122/44 (!) 126/54  Pulse: (!) 58 (!) 54 (!) 58 (!) 57  Resp: 18 19 18 14   Temp:      TempSrc:      SpO2: 99% 97% 99% 99%  Weight:      Height:       Physical Exam HENT:     Head: Normocephalic.     Mouth/Throat:     Pharynx: No oropharyngeal exudate.   Eyes:     General: Lids are normal.     Conjunctiva/sclera: Conjunctivae normal.  Cardiovascular:     Rate and Rhythm: Normal rate and regular rhythm.     Heart sounds: Normal heart sounds, S1 normal and S2 normal.  Pulmonary:     Breath sounds: Examination of the right-lower field reveals decreased breath sounds. Examination of the left-lower field reveals decreased breath sounds. Decreased breath sounds present. No wheezing, rhonchi or rales.  Abdominal:     Palpations: Abdomen is soft.     Tenderness: There is no abdominal tenderness.  Musculoskeletal:     Right lower leg: No swelling.     Left lower leg: No swelling.  Skin:    General: Skin is warm.     Findings: No rash.  Neurological:     Mental Status: She is alert and oriented to person, place, and time.    Data Reviewed: Creatinine 3.15 with a GFR 14, sodium 131, potassium 3.3  Family Communication: Updated patient's daughter on the phone.  Disposition: Status is: Inpatient Remains inpatient appropriate because: Creatinine continues to rise.  Planned Discharge Destination: Rehab    Time spent: 28 minutes  Author: Alford Highland, MD 04/06/2023 5:46 PM  For on call review [email or web address].

## 2023-04-06 NOTE — Assessment & Plan Note (Signed)
Today with hypokalemia.  Since patient back on IV Lasix will give some potassium supplementation.

## 2023-04-07 DIAGNOSIS — E871 Hypo-osmolality and hyponatremia: Secondary | ICD-10-CM | POA: Diagnosis not present

## 2023-04-07 DIAGNOSIS — E876 Hypokalemia: Secondary | ICD-10-CM | POA: Diagnosis not present

## 2023-04-07 DIAGNOSIS — R570 Cardiogenic shock: Secondary | ICD-10-CM | POA: Diagnosis not present

## 2023-04-07 DIAGNOSIS — Z7189 Other specified counseling: Secondary | ICD-10-CM | POA: Diagnosis not present

## 2023-04-07 DIAGNOSIS — N1832 Chronic kidney disease, stage 3b: Secondary | ICD-10-CM | POA: Diagnosis not present

## 2023-04-07 DIAGNOSIS — N179 Acute kidney failure, unspecified: Secondary | ICD-10-CM | POA: Diagnosis not present

## 2023-04-07 DIAGNOSIS — I10 Essential (primary) hypertension: Secondary | ICD-10-CM | POA: Diagnosis not present

## 2023-04-07 LAB — BASIC METABOLIC PANEL
Anion gap: 12 (ref 5–15)
BUN: 70 mg/dL — ABNORMAL HIGH (ref 8–23)
CO2: 21 mmol/L — ABNORMAL LOW (ref 22–32)
Calcium: 7.8 mg/dL — ABNORMAL LOW (ref 8.9–10.3)
Chloride: 100 mmol/L (ref 98–111)
Creatinine, Ser: 3.14 mg/dL — ABNORMAL HIGH (ref 0.44–1.00)
GFR, Estimated: 14 mL/min — ABNORMAL LOW (ref >60)
Glucose, Bld: 112 mg/dL — ABNORMAL HIGH (ref 70–99)
Potassium: 3.6 mmol/L (ref 3.5–5.1)
Sodium: 133 mmol/L — ABNORMAL LOW (ref 135–145)

## 2023-04-07 LAB — COOXEMETRY PANEL
Carboxyhemoglobin: 1.2 % (ref 0.5–1.5)
Methemoglobin: 1.4 % (ref 0.0–1.5)
O2 Saturation: 65.2 %
Total hemoglobin: 9.8 g/dL — ABNORMAL LOW (ref 12.0–16.0)
Total oxygen content: 63.5 %

## 2023-04-07 LAB — GLUCOSE, CAPILLARY
Glucose-Capillary: 99 mg/dL (ref 70–99)
Glucose-Capillary: 227 mg/dL — ABNORMAL HIGH (ref 70–99)
Glucose-Capillary: 302 mg/dL — ABNORMAL HIGH (ref 70–99)
Glucose-Capillary: 311 mg/dL — ABNORMAL HIGH (ref 70–99)

## 2023-04-07 MED ORDER — HYDRALAZINE HCL 25 MG PO TABS
37.5000 mg | ORAL_TABLET | Freq: Three times a day (TID) | ORAL | Status: DC
  Administered 2023-04-07 – 2023-04-08 (×4): 37.5 mg via ORAL
  Filled 2023-04-07 (×5): qty 2

## 2023-04-07 NOTE — Progress Notes (Signed)
Advanced Heart Failure Team Progress Note   Primary Physician: Reubin Milan, MD PCP-Cardiologist:  None  Reason for Consultation: Acute systolic HF  Interval history  6/6 Switched to torsemide.  6/7 Off milrinone. Placed on oral amio.  6/8 Low dose coreg started.   Lab Results  Component Value Date   CREATININE 3.14 (H) 04/07/2023   CREATININE 3.15 (H) 04/06/2023   CREATININE 2.97 (H) 04/05/2023   - Mixed venous and COOX stable today  - sCr relatively stable over the past 48H - CVP 7  Objective:    Vital Signs:   Temp:  [97.3 F (36.3 C)-98.2 F (36.8 C)] 97.3 F (36.3 C) (06/12 0805) Pulse Rate:  [51-67] 59 (06/12 0805) Resp:  [12-20] 18 (06/12 0805) BP: (104-141)/(27-107) 141/61 (06/12 0805) SpO2:  [96 %-99 %] 98 % (06/12 0819) FiO2 (%):  [24 %] 24 % (06/12 0000) Last BM Date : 04/04/23  Weight change: Filed Weights   04/04/23 0714 04/05/23 0500 04/06/23 0500  Weight: 61.8 kg 61.3 kg 60.1 kg    Intake/Output:   Intake/Output Summary (Last 24 hours) at 04/07/2023 0949 Last data filed at 04/07/2023 0200 Gross per 24 hour  Intake 720 ml  Output 1125 ml  Net -405 ml    CVP 7-9 Physical Exam    General:  Appears weak.  No resp difficulty HEENT: normal Neck: supple. JVP 5-7 Cor: PMI nondisplaced. Regular rate & rhythm. No rubs, gallops or murmurs. Lungs: Decreased on left on 2 liters [place].  Abdomen: soft, nontender, nondistended. No hepatosplenomegaly. No bruits or masses. Good bowel sounds. Extremities: no cyanosis, clubbing, rash, edema Neuro: alert & orientedx3, cranial nerves grossly intact. moves all 4 extremities w/o difficulty. Affect pleasant   Telemetry   SR 70-90s   Labs   Basic Metabolic Panel: Recent Labs  Lab 04/01/23 0503 04/02/23 0406 04/03/23 0505 04/04/23 0544 04/05/23 0847 04/06/23 0536 04/07/23 0502  NA 132* 130* 129* 131* 130* 131* 133*  K 3.6 4.9 5.3* 4.8 4.2 3.3* 3.6  CL 99 99 97* 100 98 98 100  CO2 22 22 21*  23 20* 21* 21*  GLUCOSE 114* 179* 94 68* 87 108* 112*  BUN 77* 72* 69* 70* 68* 70* 70*  CREATININE 2.16* 2.31* 2.50* 2.63* 2.97* 3.15* 3.14*  CALCIUM 8.0* 8.1* 8.5* 8.6* 8.4* 8.1* 7.8*  MG 2.0 1.9 2.1  --   --   --   --     CBC: Recent Labs  Lab 04/01/23 0503 04/02/23 0406 04/03/23 0505 04/04/23 0544 04/06/23 0536  WBC 11.0* 14.3* 11.7* 11.7* 14.2*  HGB 8.1* 8.3* 8.6* 8.2* 8.9*  HCT 25.1* 26.2* 26.4* 26.0* 28.2*  MCV 88.1 88.8 88.9 90.6 89.0  PLT 205 215 204 201 237    BNP: BNP (last 3 results) Recent Labs    03/25/23 1001  BNP 971.3*    CBG: Recent Labs  Lab 04/06/23 0726 04/06/23 1127 04/06/23 1635 04/06/23 2108 04/07/23 0809  GLUCAP 105* 230* 128* 125* 99    Imaging   No results found.   Medications:     Current Medications:  amiodarone  200 mg Oral BID   apixaban  2.5 mg Oral BID   arformoterol  15 mcg Nebulization BID   aspirin EC  81 mg Oral Daily   carvedilol  3.125 mg Oral BID WC   Chlorhexidine Gluconate Cloth  6 each Topical Daily   cyanocobalamin  1,000 mcg Oral Daily   feeding supplement (NEPRO CARB STEADY)  237 mL Oral BID BM   gabapentin  100 mg Oral QHS   hydrALAZINE  37.5 mg Oral Q8H   insulin aspart  0-15 Units Subcutaneous TID WC   insulin aspart  0-5 Units Subcutaneous QHS   insulin glargine-yfgn  8 Units Subcutaneous QHS   isosorbide mononitrate  30 mg Oral Daily   multivitamin  1 tablet Oral BID   nystatin   Topical TID   polyethylene glycol  17 g Oral Daily   potassium chloride  10 mEq Oral BID   promethazine  6.25 mg Oral QHS   revefenacin  175 mcg Nebulization Daily   rosuvastatin  10 mg Oral Once per day on Mon Wed Fri Sat    Infusions:      Assessment/Plan   1. Acute systolic HF -> cardiogenic shock - Echo 2023 EF 55-60% - Echo this admit read as EF 30-35%. RV ok  -Per Dr Gala Romney EF 35-40% there is severe HK of basaliar to mid lateral wall and inferolateral wall - High-sensitivity troponin 225 -> 458 -  >1186 -> 1165.  BNP 971  - Suspect this is an ischemic CM  - Developed low output HF and elevated volume status. Initial co-ox 44% CVP 21 (03/29/23) - Mixed venous stable today at 65%, sCr 3.1 - On exam today patient is euvolemic, JVP 7cm. Will hold off on further diuresis.  - She has severe MR; only option for treatment is medical management. Will increase hydralazine to 37.5mg  TID. Goal SBP ~110. Will likely require palliative care at discharge.  - Expected D/C in 48H.   2. NSTEMI - echo as above with RWMA - no longer having CP - continue ASA/statin - Creatinine trending up  is starting to level out > 2.0 and no current angina will defer cath this admission.  3. AKI on CKD 3a - likely ATN/cardiorenal  - Scr 1.5 -> 2.8 -> 2.7 -> 2.4 -> 2.1 -> 2.3 - 2.5>2.6 >3  - 6/6 renal u/s - no hydronephrosis.  - renal following - agree with nephrology; not a candidate for iHD.   4. Acute hypoxic respiratory failure - likely due primarily to HF and pleural effusions on top of underlying COPD - s/p R thora on 6/3 with 700cc out  - 6/7 Bilateral pleural effsions.   5. PAF  - back in NSR now - Initially on amio drip. Now on amio po amio 200 mg twice a day - On eliquis 2.5 mg twice a day. Reduced dose with age and creatinine.   6. Hypervolemic hyponatremia - restrict FW, treat HF - improving  7. Urinary retention  8. DNR/DNI/no dialysis - confirmed with patient and her daughter    Length of Stay: 75  Dorthula Nettles, DO  04/07/2023, 9:49 AM  Advanced Heart Failure Team Pager [phone] (M-F; 7a - 5p)  Please contact [facility] for night-coverage after hours (4p -7a ) and weekends on [email or web address]

## 2023-04-07 NOTE — Progress Notes (Signed)
[facility]  ROUNDING NOTE   Subjective:   Ms. MCKINNEY GLASS was admitted to [facility] on 03/25/2023 for COPD exacerbation [facility]) [J44.1] Chest pain [R07.9] Community acquired pneumonia, unspecified laterality [J18.9]  Patient seen sitting up in bed Therapy at bedside attempting to assist patient to chair Patient states she feels well Ready to get out of bed Remains on 2 L nasal cannula No lower extremity edema  Creatinine 3.14 (3.15) (2.97) (2.63) (2.50)(2.31) BUN 70  Objective:  Vital signs in last 24 hours:  Temp:  [97.3 F (36.3 C)-98.2 F (36.8 C)] 97.5 F (36.4 C) (06/12 1138) Pulse Rate:  [51-67] 56 (06/12 1138) Resp:  [12-20] 18 (06/12 1138) BP: (119-141)/(45-107) 120/47 (06/12 1138) SpO2:  [96 %-99 %] 97 % (06/12 1138) FiO2 (%):  [24 %] 24 % (06/12 0000)  Weight change:  Filed Weights   04/04/23 0714 04/05/23 0500 04/06/23 0500  Weight: 61.8 kg 61.3 kg 60.1 kg    Intake/Output: I/O last 3 completed shifts: In: 930 [P.O.:930] Out: 1125 [Urine:1125]   Intake/Output this shift:  Total I/O In: 220 [P.O.:220] Out: -   Physical Exam: General: NAD, laying in bed  Head: Normocephalic, atraumatic. Moist oral mucosal membranes  Eyes: Anicteric  Lungs:  Mild  wheezing  Heart: regular  Abdomen:  Soft, nontender,   Extremities:  No peripheral edema.  Neurologic: Alert and oriented, moving all four extremities  Skin: warm        Basic Metabolic Panel: Recent Labs  Lab 04/01/23 0503 04/02/23 0406 04/03/23 0505 04/04/23 0544 04/05/23 0847 04/06/23 0536 04/07/23 0502  NA 132* 130* 129* 131* 130* 131* 133*  K 3.6 4.9 5.3* 4.8 4.2 3.3* 3.6  CL 99 99 97* 100 98 98 100  CO2 22 22 21* 23 20* 21* 21*  GLUCOSE 114* 179* 94 68* 87 108* 112*  BUN 77* 72* 69* 70* 68* 70* 70*  CREATININE 2.16* 2.31* 2.50* 2.63* 2.97* 3.15* 3.14*  CALCIUM 8.0* 8.1* 8.5* 8.6* 8.4* 8.1* 7.8*  MG 2.0 1.9 2.1  --   --   --   --      Liver Function Tests: No results  for input(s): "AST", "ALT", "ALKPHOS", "BILITOT", "PROT", "ALBUMIN" in the last 168 hours.  No results for input(s): "LIPASE", "AMYLASE" in the last 168 hours. No results for input(s): "AMMONIA" in the last 168 hours.  CBC: Recent Labs  Lab 04/01/23 0503 04/02/23 0406 04/03/23 0505 04/04/23 0544 04/06/23 0536  WBC 11.0* 14.3* 11.7* 11.7* 14.2*  HGB 8.1* 8.3* 8.6* 8.2* 8.9*  HCT 25.1* 26.2* 26.4* 26.0* 28.2*  MCV 88.1 88.8 88.9 90.6 89.0  PLT 205 215 204 201 237     Cardiac Enzymes: No results for input(s): "CKTOTAL", "CKMB", "CKMBINDEX", "TROPONINI" in the last 168 hours.  BNP: Invalid input(s): "POCBNP"  CBG: Recent Labs  Lab 04/06/23 1127 04/06/23 1635 04/06/23 2108 04/07/23 0809 04/07/23 1139  GLUCAP 230* 128* 125* 99 227*     Microbiology: Results for orders placed or performed during the hospital encounter of 03/25/23  Blood Culture (routine x 2)     Status: None   Collection Time: 03/25/23 12:09 PM   Specimen: BLOOD  Result Value Ref Range Status   Specimen Description BLOOD LEFT [facility]  Final   Special Requests   Final    BOTTLES DRAWN AEROBIC AND ANAEROBIC Blood Culture adequate volume   Culture   Final    NO GROWTH 5 DAYS Performed at [facility], [address] [place], [place] [address]    Report Status 03/30/2023 FINAL  Final  Blood Culture (routine x 2)     Status: None   Collection Time: 03/25/23 12:10 PM   Specimen: BLOOD  Result Value Ref Range Status   Specimen Description BLOOD LEFT FA  Final   Special Requests   Final    BOTTLES DRAWN AEROBIC AND ANAEROBIC Blood Culture adequate volume   Culture   Final    NO GROWTH 5 DAYS Performed at [facility], [address] [place], [place] [address]    Report Status 03/30/2023 FINAL  Final  SARS Coronavirus 2 by RT PCR (hospital order, performed in [facility] hospital lab) *cepheid single result test* Anterior Nasal Swab     Status: None   Collection Time: 03/25/23  1:18  PM   Specimen: Anterior Nasal Swab  Result Value Ref Range Status   SARS Coronavirus 2 by RT PCR NEGATIVE NEGATIVE Final    Comment: (NOTE) SARS-CoV-2 target nucleic acids are NOT DETECTED.  The SARS-CoV-2 RNA is generally detectable in upper and lower respiratory specimens during the acute phase of infection. The lowest concentration of SARS-CoV-2 viral copies this assay can detect is 250 copies / mL. A negative result does not preclude SARS-CoV-2 infection and should not be used as the sole basis for treatment or other patient management decisions.  A negative result may occur with improper specimen collection / handling, submission of specimen other than nasopharyngeal swab, presence of viral mutation(s) within the areas targeted by this assay, and inadequate number of viral copies (<250 copies / mL). A negative result must be combined with clinical observations, patient history, and epidemiological information.  Fact Sheet for Patients:   RoadLapTop.co.za  Fact Sheet for Healthcare Providers: http://kim-miller.com/  This test is not yet approved or  cleared by the [place] FDA and has been authorized for detection and/or diagnosis of SARS-CoV-2 by FDA under an Emergency Use Authorization (EUA).  This EUA will remain in effect (meaning this test can be used) for the duration of the COVID-19 declaration under Section 564(b)(1) of the Act, 21 U.S.C. section 360bbb-3(b)(1), unless the authorization is terminated or revoked sooner.  Performed at [facility], [address] [place], [place] [address]   MRSA Next Gen by PCR, Nasal     Status: None   Collection Time: 03/27/23  4:40 AM   Specimen: Nasal Mucosa; Nasal Swab  Result Value Ref Range Status   MRSA by PCR Next Gen NOT DETECTED NOT DETECTED Final    Comment: (NOTE) The GeneXpert MRSA Assay (FDA approved for NASAL specimens only), is one component of a  comprehensive MRSA colonization surveillance program. It is not intended to diagnose MRSA infection nor to guide or monitor treatment for MRSA infections. Test performance is not FDA approved in patients less than 34 years old. Performed at [facility], [address] [place], [place] [address]   Respiratory (~20 pathogens) panel by PCR     Status: None   Collection Time: 03/28/23  4:00 PM   Specimen: Nasopharyngeal Swab; Respiratory  Result Value Ref Range Status   Adenovirus NOT DETECTED NOT DETECTED Final   Coronavirus 229E NOT DETECTED NOT DETECTED Final    Comment: (NOTE) The Coronavirus on the Respiratory Panel, DOES NOT test for the novel  Coronavirus (2019 nCoV)    Coronavirus HKU1 NOT DETECTED NOT DETECTED Final   Coronavirus NL63 NOT DETECTED NOT DETECTED Final   Coronavirus OC43 NOT DETECTED  NOT DETECTED Final   Metapneumovirus NOT DETECTED NOT DETECTED Final   Rhinovirus / Enterovirus NOT DETECTED NOT DETECTED Final   Influenza A NOT DETECTED NOT DETECTED Final   Influenza B NOT DETECTED NOT DETECTED Final   Parainfluenza Virus 1 NOT DETECTED NOT DETECTED Final   Parainfluenza Virus 2 NOT DETECTED NOT DETECTED Final   Parainfluenza Virus 3 NOT DETECTED NOT DETECTED Final   Parainfluenza Virus 4 NOT DETECTED NOT DETECTED Final   Respiratory Syncytial Virus NOT DETECTED NOT DETECTED Final   Bordetella pertussis NOT DETECTED NOT DETECTED Final   Bordetella Parapertussis NOT DETECTED NOT DETECTED Final   Chlamydophila pneumoniae NOT DETECTED NOT DETECTED Final   Mycoplasma pneumoniae NOT DETECTED NOT DETECTED Final    Comment: Performed at [facility], [address] [place], [place] [address]  Body fluid culture w Gram Stain     Status: None   Collection Time: 03/29/23 10:27 AM   Specimen: PATH Cytology Pleural fluid  Result Value Ref Range Status   Specimen Description   Final    PLEURAL Performed at [facility], [address]  [place], [place] [address]    Special Requests   Final    NONE Performed at [facility], [address] [place], [place] [address]    Gram Stain NO WBC SEEN NO ORGANISMS SEEN   Final   Culture   Final    NO GROWTH 3 DAYS Performed at [facility], [address], [place], [place] [address]    Report Status 04/01/2023 FINAL  Final  MRSA Next Gen by PCR, Nasal     Status: None   Collection Time: 03/29/23  3:00 PM   Specimen: Nasal Mucosa; Nasal Swab  Result Value Ref Range Status   MRSA by PCR Next Gen NOT DETECTED NOT DETECTED Final    Comment: (NOTE) The GeneXpert MRSA Assay (FDA approved for NASAL specimens only), is one component of a comprehensive MRSA colonization surveillance program. It is not intended to diagnose MRSA infection nor to guide or monitor treatment for MRSA infections. Test performance is not FDA approved in patients less than 31 years old. Performed at [facility], [address] [place], [place] [address]   Group A Strep by PCR     Status: None   Collection Time: 03/30/23  6:00 PM   Specimen: Throat; Sterile Swab  Result Value Ref Range Status   Group A Strep by PCR NOT DETECTED NOT DETECTED Final    Comment: Performed at [facility], [address] [place], [place] [address]    Coagulation Studies: No results for input(s): "LABPROT", "INR" in the last 72 hours.   Urinalysis: No results for input(s): "COLORURINE", "LABSPEC", "PHURINE", "GLUCOSEU", "HGBUR", "BILIRUBINUR", "KETONESUR", "PROTEINUR", "UROBILINOGEN", "NITRITE", "LEUKOCYTESUR" in the last 72 hours.  Invalid input(s): "APPERANCEUR"    Imaging: No results found.   Medications:      amiodarone  200 mg Oral BID   apixaban  2.5 mg Oral BID   arformoterol  15 mcg Nebulization BID   aspirin EC  81 mg Oral Daily   carvedilol  3.125 mg Oral BID WC   Chlorhexidine Gluconate Cloth  6 each Topical Daily   cyanocobalamin  1,000 mcg Oral Daily    feeding supplement (NEPRO CARB STEADY)  237 mL Oral BID BM   gabapentin  100 mg Oral QHS   hydrALAZINE  37.5 mg Oral Q8H   insulin aspart  0-15 Units Subcutaneous  TID WC   insulin aspart  0-5 Units Subcutaneous QHS   insulin glargine-yfgn  8 Units Subcutaneous QHS   isosorbide mononitrate  30 mg Oral Daily   multivitamin  1 tablet Oral BID   nystatin   Topical TID   polyethylene glycol  17 g Oral Daily   potassium chloride  10 mEq Oral BID   promethazine  6.25 mg Oral QHS   revefenacin  175 mcg Nebulization Daily   rosuvastatin  10 mg Oral Once per day on Mon Wed Fri Sat   acetaminophen, alum & mag hydroxide-simeth, dextromethorphan-guaiFENesin, hydrALAZINE, levalbuterol, melatonin, ondansetron (ZOFRAN) IV, mouth rinse, oxyCODONE, phenol, senna-docusate, traZODone  Assessment/ Plan:  Ms. CARIANN WEITMAN is a 88 y.o.  female with diabetes mellitus type II, hypertension, coronary artery disease, peripheral arterial disease, tremor, CVA, congestive heart failure , anxiety, peripheral arterial diease and anemia who is admitted to [facility] on 03/25/2023 for COPD exacerbation (HCC) [J44.1] Chest pain [R07.9] Community acquired pneumonia, unspecified laterality [J18.9]  Acute kidney injury with acute metabolic acidosis on chronic kidney disease stage IIIB: baseline creatinine of 1.48, GFR of 34 on 12/09/2022. Acute kidney injury secondary to acute cardiorenal syndrome . Chronic kidney disease secondary to hypertension.  Serum creatinine slightly worse again today.   Maintain hemodynamic stability. Serial bladder scans. I/O cath as needed. Avoid hypotension/nonsteroidals/IV contrast Electrolytes and volume status are acceptable.  No acute indication for dialysis Though creatinine appears to have stabilized today, due to patient's multiple comorbidities, patient will make a poor long-term dialysis patient.   Hypertension with chronic kidney disease: with chronic systolic and diastolic congestive heart  failure.  Appreciate cardiology input.  Heart failure regimen includes isosorbide/hydralazine Patient has incontinence occasionally.  Hesitant to start SGLT2 inhibitor as it will increase these occurrences.  Hyponatremia: Sodium level has ranged between 129-132.    4.  Hypokalemia Potassium corrected to 3.6.     LOS: 13   6/12/20244:03 PM

## 2023-04-07 NOTE — Progress Notes (Signed)
[facility]  - [place] [facility]   PATIENT NAME: Jill Hudson    MR#:  [id number]  DATE OF BIRTH:  [birth year]  SUBJECTIVE:   Seen earlier daughter at bedside. Patient sitting out in the chair. Denies chest pain.   VITALS:  Blood pressure (!) 120/47, pulse (!) 56, temperature (!) 97.5 F (36.4 C), temperature source Oral, resp. rate 18, height 5' (1.524 m), weight 60.1 kg, SpO2 97 %.  PHYSICAL EXAMINATION:   GENERAL:  88 y.o.-year-old patient with no acute distress.  LUNGS: decreased breath sounds bilaterally, no wheezing CARDIOVASCULAR: S1, S2 normal. No murmur   ABDOMEN: Soft, nontender, nondistended. Bowel sounds present.  EXTREMITIES: No  edema b/l.    NEUROLOGIC: nonfocal  patient is alert and awake LABORATORY PANEL:  CBC Recent Labs  Lab 04/06/23 0536  WBC 14.2*  HGB 8.9*  HCT 28.2*  PLT 237    Chemistries  Recent Labs  Lab 04/03/23 0505 04/04/23 0544 04/07/23 0502  NA 129*   < > 133*  K 5.3*   < > 3.6  CL 97*   < > 100  CO2 21*   < > 21*  GLUCOSE 94   < > 112*  BUN 69*   < > 70*  CREATININE 2.50*   < > 3.14*  CALCIUM 8.5*   < > 7.8*  MG 2.1  --   --    < > = values in this interval not displayed.   Assessment and Plan  52-year-old female with history of COPD, HTN, HLD, DM2, stroke, TIA, GERD, anxiety, PVD, CAD, MI, former tobacco use, CKD stage III, GI bleed, iron deficiency anemia, PAD status post PCI comes to the hospital with shortness of breath and hypoxia. Patient was found to have bilateral pulmonary edema /effusion with elevated BNP.   Cardiogenic shock [facility]) Cardiology took off milrinone drip 6/7.  Not on any pressors at this point. --bp stable   Acute on chronic systolic CHF (congestive heart failure) (HCC) Patient switched to IV Lasix 80 mg twice a day, hydralazine, Imdur and low-dose Coreg.  No ACE/ARB or spironolactone with her worsening kidney function.   Paroxysmal atrial fibrillation (HCC) Patient converted to  normal sinus rhythm.  Taken off amiodarone drip and switched to oral on 6/7.  Heparin drip converted over to Eliquis on 6/7.   Acute kidney injury superimposed on CKD (HCC) AKI on CKD stage IIIa.  Creatinine now 3.15.  Continue to monitor.  Nephrology will continue to talk with patient and family about next steps.   NSTEMI (non-ST elevated myocardial infarction) (HCC) With creatinine above 2, medical management.  Continue aspirin, Imdur Coreg and low-dose Crestor.   Type II diabetes mellitus with renal manifestations (HCC) Last hemoglobin A1c 7.9.  Hypoglycemic episode the other morning.  Decreased Semglee insulin down to 8 units at night.  Continue sliding scale.   COPD exacerbation (HCC) Continue nebulizers.   Essential hypertension Continue hydralazine, Imdur and low-dose Coreg.   History of CVA (cerebrovascular accident) Patient on Eliquis for stroke prevention   HLD (hyperlipidemia) On Crestor   Hyponatremia Sodium 131.   Iron deficiency anemia Last hemoglobin 8.9.  Continue to monitor.   Acute urinary retention Patient required in and out catheterization this morning.  Continue to monitor.   Hyperkalemia Previously treated.   Pleural effusion Right thoracentesis removed 700 mL of fluid.  This was done by interventional radiology on 6/3.   Hypokalemia Today with hypokalemia.  Since patient back on IV  Lasix will give some potassium supplementation.   Cardiogenic shock [facility]) Cardiology took off milrinone drip 6/7.  Not on any pressors at this point.   Acute on chronic systolic CHF (congestive heart failure) (HCC) Acute respiratory failure with hypoxia (HCC) --Patient initially requiring BiPAP secondary to respiratory distress.  -- Currently on 2 L. --cont hydralazine, Imdur and low-dose Coreg.  No ACE/ARB or spironolactone with her worsening kidney function. --6/12--holding lasix today per cardiology   Paroxysmal atrial fibrillation [facility]) Patient converted to  normal sinus rhythm.  Taken off amiodarone drip and switched to oral on 6/7.  Heparin drip converted over to Eliquis on 6/7.   Acute kidney injury superimposed on CKD (HCC) --AKI on CKD stage IIIa.  Creatinine now 3.15.  --Nephrology will continue to talk with patient and family about next steps. -- Per nephrology patient is not the best candidate for dialysis.   NSTEMI (non-ST elevated myocardial infarction) (HCC) --With creatinine above 2, medical management.  -- Continue aspirin, Imdur Coreg and low-dose Crestor.   Type II diabetes mellitus with renal manifestations (HCC) --Last hemoglobin A1c 7.9.  Hypoglycemic episode the other morning.  Decreased Semglee insulin down to 8 units at night.   --Continue sliding scale.   COPD exacerbation (HCC) --Continue nebulizers.   Essential hypertension --Continue hydralazine, Imdur and low-dose Coreg.   History of CVA (cerebrovascular accident) --Patient on Eliquis for stroke prevention   HLD (hyperlipidemia) --On Crestor   Hyponatremia --Sodium 131.   Iron deficiency anemia Last hemoglobin 8.9.  Continue to monitor.   Acute urinary retention Patient required in and out catheter this morning.    Hyperkalemia Previously treated.   Pleural effusion Right thoracentesis removed 700 mL of fluid.  This was done by interventional radiology on 6/3.   Hypokalemia Today with hypokalemia.  Since patient back on IV Lasix will give some potassium supplementation.    Procedures: Family communication :dter Maralyn Sago Consults : palliative care, [facility] cardiology and nephrology CODE STATUS: DNR DNI DVT Prophylaxis : Level of care: Progressive Status is: Inpatient Remains inpatient appropriate because: congestive heart failure monitoring need for Lasix and also monitoring creatinine.    TOTAL TIME TAKING CARE OF THIS PATIENT: 35 minutes.  >50% time spent on counselling and coordination of care  Note: This dictation was prepared with Dragon  dictation along with smaller phrase technology. Any transcriptional errors that result from this process are unintentional.  Enedina Finner M.D    [facility]   CC: Primary care physician; Reubin Milan, MD

## 2023-04-07 NOTE — Progress Notes (Signed)
Physical Therapy Treatment Patient Details Name: Jill Hudson MRN: [id number] DOB: 08-Nov-1934 Today's Date: 04/07/2023   History of Present Illness presented to ER secondary to progressive SOB; admitted for management of A/C CHF exacerbation, AECOPD.  s/p thoracentesis (6/3) with removal of fluid; pending cardiac cath and TEE    PT Comments    Patient sleeping on arrival to room but is agreeable to PT. No significant shortness of breath noted with mobility but she is fatigue with minimal activity. Patient continues to require physical assistance for bed mobility and stand step transfer from bed to chair. Anticipate the need for frequent assistance and ongoing PT recommended after this hospital stay, pending patient and family goals of care.    Recommendations for follow up therapy are one component of a multi-disciplinary discharge planning process, led by the attending physician.  Recommendations may be updated based on patient status, additional functional criteria and insurance authorization.  Follow Up Recommendations  Can patient physically be transported by private vehicle: No    Assistance Recommended at Discharge Frequent or constant Supervision/Assistance  Patient can return home with the following A lot of help with walking and/or transfers;A little help with bathing/dressing/bathroom;Assist for transportation   Equipment Recommendations  Rolling walker (2 wheels)    Recommendations for Other Services       Precautions / Restrictions Precautions Precautions: Fall Restrictions Weight Bearing Restrictions: No     Mobility  Bed Mobility Overal bed mobility: Needs Assistance Bed Mobility: Supine to Sit     Supine to sit: Mod assist     General bed mobility comments: assistance for trunk and BLE support. increased time and effort required with cues for sequencing    Transfers Overall transfer level: Needs assistance Equipment used: 1 person hand held  assist Transfers: Bed to chair/wheelchair/BSC   Stand pivot transfers: Min assist         General transfer comment: Min A for steadying while taking a few steps from bed to chair. verbal cues for technique. further ambulation limited due to fatigue with activity    Ambulation/Gait                   Stairs             Wheelchair Mobility    Modified Rankin (Stroke Patients Only)       Balance Overall balance assessment: Needs assistance Sitting-balance support: Feet supported Sitting balance-Leahy Scale: Fair   Postural control: Posterior lean Standing balance support: Bilateral upper extremity supported, During functional activity, Reliant on assistive device for balance Standing balance-Leahy Scale: Poor Standing balance comment: external support required with posterior lean                            Cognition Arousal/Alertness: Awake/alert Behavior During Therapy: WFL for tasks assessed/performed Overall Cognitive Status: Within Functional Limits for tasks assessed                                          Exercises      General Comments        Pertinent Vitals/Pain Pain Assessment Pain Assessment: No/denies pain    Home Living                          Prior Function  PT Goals (current goals can now be found in the care plan section) Acute Rehab PT Goals Patient Stated Goal: to get stronger and go home PT Goal Formulation: With patient Time For Goal Achievement: 04/12/23 Potential to Achieve Goals: Good Progress towards PT goals: Progressing toward goals    Frequency    Min 3X/week      PT Plan Current plan remains appropriate    Co-evaluation              AM-PAC PT "6 Clicks" Mobility   Outcome Measure  Help needed turning from your back to your side while in a flat bed without using bedrails?: A Lot Help needed moving from lying on your back to sitting on the side  of a flat bed without using bedrails?: A Lot Help needed moving to and from a bed to a chair (including a wheelchair)?: A Little Help needed standing up from a chair using your arms (e.g., wheelchair or bedside chair)?: A Little Help needed to walk in hospital room?: A Lot Help needed climbing 3-5 steps with a railing? : Total 6 Click Score: 13    End of Session   Activity Tolerance: Patient limited by fatigue;Patient tolerated treatment well Patient left: in chair;with call bell/phone within reach;with chair alarm set;with nursing/sitter in room (set-up with breakfast tray) Nurse Communication: Mobility status PT Visit Diagnosis: Muscle weakness (generalized) (M62.81);Difficulty in walking, not elsewhere classified (R26.2)     Time: 1610-9604 PT Time Calculation (min) (ACUTE ONLY): 25 min  Charges:  $Therapeutic Activity: 23-37 mins                     [name], PT, MPT    Ina Homes 04/07/2023, 9:53 AM

## 2023-04-07 NOTE — Progress Notes (Addendum)
Daily Progress Note   Patient Name: Jill Hudson       Date: 04/07/2023 DOB: February 18, 1935  Age: 88 y.o. MRN#: [id number] Attending Physician: Enedina Finner, MD Primary Care Physician: Reubin Milan, MD Admit Date: 03/25/2023  Reason for Consultation/Follow-up: Establishing goals of care  Subjective: Notes and labs reviewed. Patient states she is happy to be out of ICU/step down. 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   Length of Stay: 13  Current Medications: Scheduled Meds:   amiodarone  200 mg Oral BID   apixaban  2.5 mg Oral BID   arformoterol  15 mcg Nebulization BID   aspirin EC  81 mg Oral Daily   carvedilol  3.125 mg Oral BID WC   Chlorhexidine Gluconate Cloth  6 each Topical Daily   cyanocobalamin  1,000 mcg Oral Daily   feeding supplement (NEPRO CARB STEADY)  237 mL Oral BID BM   gabapentin  100 mg Oral QHS   hydrALAZINE  37.5 mg Oral Q8H   insulin aspart  0-15 Units Subcutaneous TID WC   insulin aspart  0-5 Units Subcutaneous QHS   insulin glargine-yfgn  8 Units Subcutaneous QHS   isosorbide mononitrate  30 mg Oral Daily   multivitamin  1 tablet Oral BID   nystatin   Topical TID   polyethylene glycol  17 g Oral Daily   potassium chloride  10 mEq Oral BID   promethazine  6.25 mg Oral QHS   revefenacin  175 mcg Nebulization Daily   rosuvastatin  10 mg Oral Once per day on Mon Wed Fri Sat    Continuous Infusions:   PRN Meds: acetaminophen, alum & mag hydroxide-simeth, dextromethorphan-guaiFENesin, hydrALAZINE, levalbuterol, melatonin, ondansetron (ZOFRAN) IV, mouth rinse, oxyCODONE, phenol, senna-docusate, traZODone  Physical Exam Pulmonary:     Effort:  Pulmonary effort is normal.  Neurological:     Mental Status: She is alert.             Vital Signs: BP (!) 120/47 (BP Location: Right Arm)   Pulse (!) 56   Temp (!) 97.5 F (36.4 C) (Oral)   Resp 18   Ht 5' (1.524 m)   Wt 60.1  kg   SpO2 97%   BMI 25.88 kg/m  SpO2: SpO2: 97 % O2 Device: O2 Device: Nasal Cannula O2 Flow Rate: O2 Flow Rate (L/min): 3 L/min  Intake/output summary:  Intake/Output Summary (Last 24 hours) at 04/07/2023 1522 Last data filed at 04/07/2023 1036 Gross per 24 hour  Intake 490 ml  Output 975 ml  Net -485 ml   LBM: Last BM Date : 04/04/23 Baseline Weight: Weight: 56.2 kg Most recent weight: Weight: 60.1 kg   Patient Active Problem List   Diagnosis Date Noted   Acute urinary retention 04/04/2023   Community acquired pneumonia 04/03/2023   Hyperkalemia 04/03/2023   Pleural effusion 04/02/2023   Acute on chronic systolic CHF (congestive heart failure) (HCC) 03/31/2023   Paroxysmal atrial fibrillation (HCC) 03/31/2023   Hypokalemia 03/31/2023   Cardiogenic shock (HCC) 03/28/2023   COPD exacerbation (HCC) 03/25/2023   HLD (hyperlipidemia) 03/25/2023   Type II diabetes mellitus with renal manifestations (HCC) 03/25/2023   CAD (coronary artery disease) 03/25/2023   Iron deficiency anemia 03/25/2023   PAD (peripheral artery disease) (HCC) 03/25/2023   Acquired thrombophilia (HCC) 03/20/2021   Mood disorder (HCC) 05/02/2020   Chronic heart failure with preserved ejection fraction (HCC) 04/12/2020   Diverticulosis of large intestine without perforation or abscess with bleeding 03/27/2020   COPD (chronic obstructive pulmonary disease) with chronic bronchitis    NSTEMI (non-ST elevated myocardial infarction) (HCC)    Acute respiratory failure with hypoxia (HCC) 02/18/2020   CKD (chronic kidney disease), stage IIIa 02/18/2020   Hyponatremia 02/18/2020   Malnutrition of mild degree (HCC) 01/08/2020   Acute kidney injury superimposed on CKD (HCC)     Age-related macular degeneration, dry, left eye 06/21/2019   Age-related macular degeneration, wet, right eye (HCC) 06/21/2019   Moderate nonproliferative diabetic retinopathy associated with type 2 diabetes mellitus (HCC) 06/21/2019   Lumbosacral radiculopathy at L4 05/01/2019   Underweight 05/01/2019   Encounter for long-term (current) use of aspirin 10/31/2018   Long term current use of oral hypoglycemic drug 10/31/2018   Encounter for long-term (current) use of insulin (HCC) 10/31/2018   Peptic ulcer disease 08/11/2018   Leg pain 07/03/2017   Carpal tunnel syndrome on both sides 05/05/2017   Myalgia due to HMG CoA reductase inhibitor 05/05/2017   History of CVA (cerebrovascular accident) 02/15/2017   Degenerative disc disease, lumbar 12/21/2016   Atherosclerosis of native arteries of extremity with intermittent claudication (HCC) 12/20/2016   Bilateral carotid artery stenosis 12/20/2016   Occlusion and stenosis of bilateral carotid arteries 12/20/2016   Hip bursitis 05/18/2016   Elevated TSH 01/18/2016   CKD stage 3 due to type 2 diabetes mellitus (HCC) 01/16/2016   Senile ecchymosis 01/16/2016   Type II diabetes mellitus with complication (HCC)    GERD (gastroesophageal reflux disease) 07/24/2015   PVD (peripheral vascular disease) (HCC) 05/17/2015   Neoplasm of uncertain behavior of skin 05/17/2015   Retinopathy, diabetic, proliferative (HCC) 04/11/2015   Hyperlipidemia associated with type 2 diabetes mellitus (HCC) 04/11/2015   Essential hypertension 04/11/2015   Generalized OA 04/11/2015   Proliferative diabetic retinopathy(362.02) 04/11/2015   Arteriosclerosis of coronary artery 05/29/2013   Hypertensive heart disease without CHF 05/29/2013    Palliative Care Assessment & Plan    Recommendations/Plan: Continue current care at this time.    Code Status:    Code Status Orders  (From admission, onward)           Start     Ordered   03/31/23 1502  Do not  attempt resuscitation (DNR)  Continuous       Question Answer Comment  If patient has no pulse and is not breathing Do Not Attempt Resuscitation   If patient has a pulse and/or is breathing: Medical Treatment Goals LIMITED ADDITIONAL INTERVENTIONS: Use medication/IV fluids and cardiac monitoring as indicated; Do not use intubation or mechanical ventilation (DNI), also provide comfort medications.  Transfer to Progressive/Stepdown as indicated, avoid Intensive Care.   Consent: Discussion documented in EHR or advanced directives reviewed      03/31/23 1501           Code Status History     Date Active Date Inactive Code Status Order ID Comments User Context   03/25/2023 1214 03/31/2023 1501 Full Code [id number]  Lorretta Harp, MD ED   02/18/2020 1059 02/26/2020 2310 Full Code [id number]  Lorretta Harp, MD ED   12/25/2019 0555 12/28/2019 2204 Full Code [id number]  Mansy, Vernetta Honey, MD ED   10/12/2018 1647 10/15/2018 1808 Full Code [id number]  Schnier, Latina Craver, MD Inpatient   09/16/2018 1254 09/16/2018 2048 Full Code [id number]  Gilda Crease, Latina Craver, MD Inpatient   08/11/2018 1706 08/13/2018 2136 Full Code [id number]  Bertrum Sol, MD Inpatient   07/26/2018 0921 07/26/2018 1652 Full Code [id number]  Renford Dills, MD Inpatient   08/23/2017 0935 08/23/2017 1419 Full Code [id number]  Annice Needy, MD Inpatient   02/15/2017 1538 02/16/2017 1959 Full Code [id number]  Auburn Bilberry, MD Inpatient     Thank you for allowing the Palliative Medicine Team to assist in the care of this patient.   Morton Stall, NP  Please contact Palliative Medicine Team phone at [phone] for questions and concerns.

## 2023-04-08 DIAGNOSIS — D508 Other iron deficiency anemias: Secondary | ICD-10-CM | POA: Diagnosis not present

## 2023-04-08 DIAGNOSIS — I5023 Acute on chronic systolic (congestive) heart failure: Secondary | ICD-10-CM | POA: Diagnosis not present

## 2023-04-08 DIAGNOSIS — I739 Peripheral vascular disease, unspecified: Secondary | ICD-10-CM | POA: Diagnosis not present

## 2023-04-08 DIAGNOSIS — J189 Pneumonia, unspecified organism: Secondary | ICD-10-CM | POA: Diagnosis not present

## 2023-04-08 DIAGNOSIS — J9601 Acute respiratory failure with hypoxia: Secondary | ICD-10-CM | POA: Diagnosis not present

## 2023-04-08 DIAGNOSIS — I480 Paroxysmal atrial fibrillation: Secondary | ICD-10-CM | POA: Diagnosis not present

## 2023-04-08 DIAGNOSIS — I214 Non-ST elevation (NSTEMI) myocardial infarction: Secondary | ICD-10-CM | POA: Diagnosis not present

## 2023-04-08 DIAGNOSIS — J441 Chronic obstructive pulmonary disease with (acute) exacerbation: Secondary | ICD-10-CM | POA: Diagnosis not present

## 2023-04-08 DIAGNOSIS — J90 Pleural effusion, not elsewhere classified: Secondary | ICD-10-CM | POA: Diagnosis not present

## 2023-04-08 DIAGNOSIS — Z7189 Other specified counseling: Secondary | ICD-10-CM | POA: Diagnosis not present

## 2023-04-08 DIAGNOSIS — R570 Cardiogenic shock: Secondary | ICD-10-CM | POA: Diagnosis not present

## 2023-04-08 LAB — BASIC METABOLIC PANEL
Anion gap: 13 (ref 5–15)
BUN: 69 mg/dL — ABNORMAL HIGH (ref 8–23)
CO2: 20 mmol/L — ABNORMAL LOW (ref 22–32)
Calcium: 8.4 mg/dL — ABNORMAL LOW (ref 8.9–10.3)
Chloride: 99 mmol/L (ref 98–111)
Creatinine, Ser: 3.53 mg/dL — ABNORMAL HIGH (ref 0.44–1.00)
GFR, Estimated: 12 mL/min — ABNORMAL LOW (ref >60)
Glucose, Bld: 135 mg/dL — ABNORMAL HIGH (ref 70–99)
Potassium: 4.2 mmol/L (ref 3.5–5.1)
Sodium: 132 mmol/L — ABNORMAL LOW (ref 135–145)

## 2023-04-08 LAB — GLUCOSE, CAPILLARY
Glucose-Capillary: 111 mg/dL — ABNORMAL HIGH (ref 70–99)
Glucose-Capillary: 245 mg/dL — ABNORMAL HIGH (ref 70–99)
Glucose-Capillary: 137 mg/dL — ABNORMAL HIGH (ref 70–99)
Glucose-Capillary: 138 mg/dL — ABNORMAL HIGH (ref 70–99)

## 2023-04-08 MED ORDER — INSULIN ASPART 100 UNIT/ML IJ SOLN
2.0000 [IU] | Freq: Three times a day (TID) | INTRAMUSCULAR | Status: DC
  Administered 2023-04-08 – 2023-04-20 (×27): 2 [IU] via SUBCUTANEOUS
  Filled 2023-04-08 (×26): qty 1

## 2023-04-08 NOTE — Progress Notes (Addendum)
Daily Progress Note   Patient Name: Jill Hudson       Date: 04/08/2023 DOB: December 03, 1934  Age: 88 y.o. MRN#: [id number] Attending Physician: Enedina Finner, MD Primary Care Physician: Reubin Milan, MD Admit Date: 03/25/2023  Reason for Consultation/Follow-up: Establishing goals of care  Subjective: Notes and labs reviewed.  In to see patient, no family at bedside.  She has been kept updated by medical teams.  We discussed updates, and discussed her renal function, and she confirms that she would not want dialysis.  She states she wants to continue current care at this time, but tells me she understands that she cannot live forever.  She discusses the death of her husband, and other family and friends whom she has outlived.  She discusses her children and other family members.    She states she will continue to take 1 day at a time.  Offered a family conversation and she states she would like to speak with each of her children separately, and privately regarding her status and care moving forward.    At this time she would like outpatient palliative to follow and may consider transition to hospice level care at a later time.  Length of Stay: 14  Current Medications: Scheduled Meds:   amiodarone  200 mg Oral BID   apixaban  2.5 mg Oral BID   arformoterol  15 mcg Nebulization BID   aspirin EC  81 mg Oral Daily   carvedilol  3.125 mg Oral BID WC   Chlorhexidine Gluconate Cloth  6 each Topical Daily   cyanocobalamin  1,000 mcg Oral Daily   feeding supplement (NEPRO CARB STEADY)  237 mL Oral BID BM   gabapentin  100 mg Oral QHS   hydrALAZINE  37.5 mg Oral Q8H   insulin aspart  0-15 Units Subcutaneous TID WC   insulin aspart  0-5 Units Subcutaneous QHS   insulin aspart  2 Units Subcutaneous  TID WC   insulin glargine-yfgn  8 Units Subcutaneous QHS   isosorbide mononitrate  30 mg Oral Daily   multivitamin  1 tablet Oral BID   nystatin   Topical TID   polyethylene glycol  17 g Oral Daily   potassium chloride  10 mEq Oral BID   promethazine  6.25 mg Oral QHS   revefenacin  175 mcg Nebulization Daily   rosuvastatin  10 mg Oral Once per day on Mon Wed Fri Sat    Continuous Infusions:   PRN Meds: acetaminophen, alum & mag hydroxide-simeth, dextromethorphan-guaiFENesin, hydrALAZINE, levalbuterol, melatonin, ondansetron (ZOFRAN) IV, mouth rinse, oxyCODONE, phenol, senna-docusate, traZODone  Physical Exam Pulmonary:     Effort: Pulmonary effort is normal.  Neurological:     Mental Status: She is alert.             Vital Signs: BP (!) 125/49 (BP Location: Left Arm)   Pulse 64   Temp 97.9 F (36.6 C)   Resp 16   Ht 5' (1.524 m)   Wt 58.4 kg   SpO2 94%   BMI 25.15 kg/m  SpO2: SpO2: 94 % O2 Device: O2 Device: Room Air O2 Flow Rate: O2 Flow Rate (L/min): 0 L/min  Intake/output summary:  Intake/Output Summary (Last 24 hours) at 04/08/2023 1457 Last data filed at 04/08/2023 1350 Gross per 24 hour  Intake 340 ml  Output 160 ml  Net 180 ml   LBM: Last BM Date : 04/08/23 Baseline Weight: Weight: 56.2 kg Most recent weight: Weight: 58.4 kg    Patient Active Problem List   Diagnosis Date Noted   Acute urinary retention 04/04/2023   Community acquired pneumonia 04/03/2023   Hyperkalemia 04/03/2023   Pleural effusion 04/02/2023   Acute on chronic systolic CHF (congestive heart failure) (HCC) 03/31/2023   Paroxysmal atrial fibrillation (HCC) 03/31/2023   Hypokalemia 03/31/2023   Cardiogenic shock (HCC) 03/28/2023   COPD exacerbation (HCC) 03/25/2023   HLD (hyperlipidemia) 03/25/2023   Type II diabetes mellitus with renal manifestations (HCC) 03/25/2023   CAD (coronary artery disease) 03/25/2023   Iron deficiency anemia 03/25/2023   PAD (peripheral artery  disease) (HCC) 03/25/2023   Acquired thrombophilia (HCC) 03/20/2021   Mood disorder (HCC) 05/02/2020   Chronic heart failure with preserved ejection fraction (HCC) 04/12/2020   Diverticulosis of large intestine without perforation or abscess with bleeding 03/27/2020   COPD (chronic obstructive pulmonary disease) with chronic bronchitis    NSTEMI (non-ST elevated myocardial infarction) (HCC)    Acute respiratory failure with hypoxia (HCC) 02/18/2020   CKD (chronic kidney disease), stage IIIa 02/18/2020   Hyponatremia 02/18/2020   Malnutrition of mild degree (HCC) 01/08/2020   Acute kidney injury superimposed on CKD (HCC)    Age-related macular degeneration, dry, left eye 06/21/2019   Age-related macular degeneration, wet, right eye (HCC) 06/21/2019   Moderate nonproliferative diabetic retinopathy associated with type 2 diabetes mellitus (HCC) 06/21/2019   Lumbosacral radiculopathy at L4 05/01/2019   Underweight 05/01/2019   Encounter for long-term (current) use of aspirin 10/31/2018   Long term current use of oral hypoglycemic drug 10/31/2018   Encounter for long-term (current) use of insulin (HCC) 10/31/2018   Peptic ulcer disease 08/11/2018   Leg pain 07/03/2017   Carpal tunnel syndrome on both sides 05/05/2017   Myalgia due to HMG CoA reductase inhibitor 05/05/2017   History of CVA (cerebrovascular accident) 02/15/2017   Degenerative disc disease, lumbar 12/21/2016   Atherosclerosis of native arteries of extremity with intermittent claudication (HCC) 12/20/2016   Bilateral carotid artery stenosis 12/20/2016   Occlusion and stenosis of bilateral carotid arteries 12/20/2016   Hip bursitis 05/18/2016   Elevated TSH 01/18/2016   CKD stage 3 due to type 2 diabetes mellitus (HCC) 01/16/2016   Senile ecchymosis 01/16/2016   Type II diabetes mellitus with complication (HCC)    GERD (gastroesophageal reflux disease) 07/24/2015   PVD (  peripheral vascular disease) (HCC) 05/17/2015    Neoplasm of uncertain behavior of skin 05/17/2015   Retinopathy, diabetic, proliferative (HCC) 04/11/2015   Hyperlipidemia associated with type 2 diabetes mellitus (HCC) 04/11/2015   Essential hypertension 04/11/2015   Generalized OA 04/11/2015   Proliferative diabetic retinopathy(362.02) 04/11/2015   Arteriosclerosis of coronary artery 05/29/2013   Hypertensive heart disease without CHF 05/29/2013    Palliative Care Assessment & Plan     Recommendations/Plan: Recommend outpatient palliative  Code Status:    Code Status Orders  (From admission, onward)           Start     Ordered   03/31/23 1502  Do not attempt resuscitation (DNR)  Continuous       Question Answer Comment  If patient has no pulse and is not breathing Do Not Attempt Resuscitation   If patient has a pulse and/or is breathing: Medical Treatment Goals LIMITED ADDITIONAL INTERVENTIONS: Use medication/IV fluids and cardiac monitoring as indicated; Do not use intubation or mechanical ventilation (DNI), also provide comfort medications.  Transfer to Progressive/Stepdown as indicated, avoid Intensive Care.   Consent: Discussion documented in EHR or advanced directives reviewed      03/31/23 1501           Code Status History     Date Active Date Inactive Code Status Order ID Comments User Context   03/25/2023 1214 03/31/2023 1501 Full Code [id number]  Lorretta Harp, MD ED   02/18/2020 1059 02/26/2020 2310 Full Code [id number]  Lorretta Harp, MD ED   12/25/2019 0555 12/28/2019 2204 Full Code [id number]  Mansy, Vernetta Honey, MD ED   10/12/2018 1647 10/15/2018 1808 Full Code [id number]  Schnier, Latina Craver, MD Inpatient   09/16/2018 1254 09/16/2018 2048 Full Code [id number]  Gilda Crease, Latina Craver, MD Inpatient   08/11/2018 1706 08/13/2018 2136 Full Code [id number]  Bertrum Sol, MD Inpatient   07/26/2018 0921 07/26/2018 1652 Full Code [id number]  Renford Dills, MD Inpatient   08/23/2017 0935 08/23/2017 1419 Full Code [id number]  Annice Needy, MD Inpatient   02/15/2017 1538 02/16/2017 1959 Full Code [id number]  Auburn Bilberry, MD Inpatient       Prognosis: Poor overall    Thank you for allowing the Palliative Medicine Team to assist in the care of this patient.    Morton Stall, NP  Please contact Palliative Medicine Team phone at [phone] for questions and concerns.

## 2023-04-08 NOTE — Progress Notes (Signed)
Physical Therapy Treatment Patient Details Name: Jill Hudson MRN: [id number] DOB: 19-Aug-1935 Today's Date: 04/08/2023   History of Present Illness Shakesha Jacobi is a 88yoF who presented to ED secondary to progressive SOB; admitted for management of A/C CHF exacerbation, AECOPD.  s/p thoracentesis (6/3) with removal of fluid; pending cardiac cath and TEE.    PT Comments    Pt in bed on entry. MinA for mobility today, but appears a little stronger than previous day. Activity tolerance is poor, pt requires 2-3 minutes recovery after 30-45 sec  of 1-3MET level activity due to easily trigger exertion. Given stability of O2 sats and continued low HR in low 60s without much variability, correlate bradycardic exertional cardiac dyspnea? Pt followed by cardiology at present. Will continue to follow.    Recommendations for follow up therapy are one component of a multi-disciplinary discharge planning process, led by the attending physician.  Recommendations may be updated based on patient status, additional functional criteria and insurance authorization.  Follow Up Recommendations  Can patient physically be transported by private vehicle: No    Assistance Recommended at Discharge Frequent or constant Supervision/Assistance  Patient can return home with the following A lot of help with walking and/or transfers;A little help with bathing/dressing/bathroom;Assist for transportation   Equipment Recommendations  Rolling walker (2 wheels)    Recommendations for Other Services       Precautions / Restrictions Precautions Precautions: Fall Restrictions Weight Bearing Restrictions: No     Mobility  Bed Mobility Overal bed mobility: Needs Assistance Bed Mobility: Supine to Sit     Supine to sit: Min assist, HOB elevated          Transfers Overall transfer level: Needs assistance Equipment used: Rolling walker (2 wheels) Transfers: Sit to/from Stand Sit to Stand: Min assist Stand  pivot transfers: Min assist              Ambulation/Gait Ambulation/Gait assistance:  (*see exercise section of note)                 Stairs             Wheelchair Mobility    Modified Rankin (Stroke Patients Only)       Balance                                            Cognition                                                Exercises Other Exercises Other Exercises: recliner heel slides 2x8 bilat (takes breaks between sets due to DOE) Other Exercises: STS from EOB x3 c minA, RW Other Exercises: Rt side stepping at EOB c RW x79ft bilat with 3 minute recovery break between.    General Comments        Pertinent Vitals/Pain Pain Assessment Pain Assessment: No/denies pain    Home Living                          Prior Function            PT Goals (current goals can now be found in the care plan section) Acute Rehab PT Goals Patient Stated  Goal: to get stronger and go home PT Goal Formulation: With patient Time For Goal Achievement: 04/12/23 Potential to Achieve Goals: Good Progress towards PT goals: Progressing toward goals    Frequency    Min 3X/week      PT Plan Current plan remains appropriate    Co-evaluation              AM-PAC PT "6 Clicks" Mobility   Outcome Measure  Help needed turning from your back to your side while in a flat bed without using bedrails?: A Lot Help needed moving from lying on your back to sitting on the side of a flat bed without using bedrails?: A Lot Help needed moving to and from a bed to a chair (including a wheelchair)?: A Lot Help needed standing up from a chair using your arms (e.g., wheelchair or bedside chair)?: A Lot Help needed to walk in hospital room?: A Little Help needed climbing 3-5 steps with a railing? : A Lot 6 Click Score: 13    End of Session Equipment Utilized During Treatment: Oxygen Activity Tolerance: Patient limited by  fatigue;Patient tolerated treatment well;No increased pain Patient left: in chair;with call bell/phone within reach;with chair alarm set;with nursing/sitter in room Nurse Communication: Mobility status PT Visit Diagnosis: Muscle weakness (generalized) (M62.81);Difficulty in walking, not elsewhere classified (R26.2)     Time: 1000-1034 PT Time Calculation (min) (ACUTE ONLY): 34 min  Charges:  $Therapeutic Exercise: 23-37 mins                    10:47 AM, 04/08/23 Rosamaria Lints, PT, DPT Physical Therapist - [facility]  [phone] (ASCOM)    Yulonda Wheeling C 04/08/2023, 10:40 AM

## 2023-04-08 NOTE — TOC Progression Note (Signed)
Transition of Care [facility]) - Progression Note    Patient Details  Name: Jill Hudson MRN: [id number] Date of Birth: 1935-10-07  [facility] CM/SW Contact  Garret Reddish, RN Phone Number: 04/08/2023, 3:58 PM  Clinical Narrative:   Chart reviewed.  I have reviewed bed offers with patient's daughter Maralyn Sago.  Maralyn Sago has accepted bed offer at [facility].  Tiffany, Admissions Coordinator at [facility] informs me that she will have a bed for the patient on Monday.    TOC will complete SNF authorization as on Sunday.    TOC will continue to follow for discharge planning.  I have made provider aware.      Expected Discharge Plan: Skilled Nursing Facility Barriers to Discharge: Continued Medical Work up  Expected Discharge Plan and Services       Living arrangements for the past 2 months: Single Family Home                                       Social Determinants of Health (SDOH) Interventions SDOH Screenings   Food Insecurity: No Food Insecurity (03/25/2023)  Housing: Low Risk  (03/25/2023)  Transportation Needs: No Transportation Needs (03/25/2023)  Utilities: Not At Risk (03/25/2023)  Alcohol Screen: Low Risk  (03/11/2022)  Depression (PHQ2-9): High Risk (01/01/2023)  Financial Resource Strain: Low Risk  (01/01/2023)  Physical Activity: Insufficiently Active (03/11/2022)  Social Connections: Socially Integrated (05/12/2022)  Stress: No Stress Concern Present (03/11/2022)  Tobacco Use: Medium Risk (03/25/2023)    Readmission Risk Interventions     No data to display

## 2023-04-08 NOTE — Care Management Important Message (Signed)
Important Message  Patient Details  Name: Jill Hudson MRN: [id number] Date of Birth: 08-Aug-1935   Medicare Important Message Given:  Yes     [name] 04/08/2023, 1:52 PM

## 2023-04-08 NOTE — Progress Notes (Signed)
Rounding Note    Patient Name: Jill Hudson Date of Encounter: 04/08/2023  [facility] Cardiologist: [facility]-advanced heart failure  Subjective   Sitting up in chair today, working with PT Continued mild shortness of breath on exertion, feels fine at rest off oxygen Maintaining normal sinus rhythm Appetite improving  Inpatient Medications    Scheduled Meds:  amiodarone  200 mg Oral BID   apixaban  2.5 mg Oral BID   arformoterol  15 mcg Nebulization BID   aspirin EC  81 mg Oral Daily   carvedilol  3.125 mg Oral BID WC   Chlorhexidine Gluconate Cloth  6 each Topical Daily   cyanocobalamin  1,000 mcg Oral Daily   feeding supplement (NEPRO CARB STEADY)  237 mL Oral BID BM   gabapentin  100 mg Oral QHS   hydrALAZINE  37.5 mg Oral Q8H   insulin aspart  0-15 Units Subcutaneous TID WC   insulin aspart  0-5 Units Subcutaneous QHS   insulin aspart  2 Units Subcutaneous TID WC   insulin glargine-yfgn  8 Units Subcutaneous QHS   isosorbide mononitrate  30 mg Oral Daily   multivitamin  1 tablet Oral BID   nystatin   Topical TID   polyethylene glycol  17 g Oral Daily   potassium chloride  10 mEq Oral BID   promethazine  6.25 mg Oral QHS   revefenacin  175 mcg Nebulization Daily   rosuvastatin  10 mg Oral Once per day on Mon Wed Fri Sat   Continuous Infusions:  PRN Meds: acetaminophen, alum & mag hydroxide-simeth, dextromethorphan-guaiFENesin, hydrALAZINE, levalbuterol, melatonin, ondansetron (ZOFRAN) IV, mouth rinse, oxyCODONE, phenol, senna-docusate, traZODone   Vital Signs    Vitals:   04/08/23 0800 04/08/23 0815 04/08/23 0841 04/08/23 1022  BP:  (!) 125/49    Pulse: 66 (!) 56  64  Resp:  16    Temp:  97.9 F (36.6 C)    TempSrc:      SpO2:  99%  94%  Weight:   58.4 kg   Height:        Intake/Output Summary (Last 24 hours) at 04/08/2023 1102 Last data filed at 04/08/2023 0930 Gross per 24 hour  Intake --  Output 10 ml  Net -10 ml      04/08/2023     8:41 AM 04/06/2023    5:00 AM 04/05/2023    5:00 AM  Last 3 Weights  Weight (lbs) 128 lb 12.8 oz 132 lb 7.9 oz 135 lb 2.3 oz  Weight (kg) 58.423 kg 60.1 kg 61.3 kg      Telemetry    Normal sinus rhythm- Personally Reviewed  ECG     - Personally Reviewed  Physical Exam   Constitutional:  oriented to person, place, and time. No distress.  HENT:  Head: Grossly normal Eyes:  no discharge. No scleral icterus.  Neck: No JVD, no carotid bruits  Cardiovascular: Regular rate and rhythm, no murmurs appreciated Pulmonary/Chest: Clear to auscultation bilaterally, no wheezes or rails Abdominal: Soft.  no distension.  no tenderness.  Musculoskeletal: Normal range of motion Neurological:  normal muscle tone. Coordination normal. No atrophy Skin: Skin warm and dry Psychiatric: normal affect, pleasant  Labs    High Sensitivity Troponin:   Recent Labs  Lab 03/25/23 1857 03/25/23 2156 03/26/23 0444 03/26/23 1428 03/26/23 1711  TROPONINIHS 225* 458* 1,186* 1,165* 1,263*     Chemistry Recent Labs  Lab 04/02/23 0406 04/03/23 0505 04/04/23 1610 04/05/23 0847 04/06/23 0536  04/07/23 0502  NA 130* 129*   < > 130* 131* 133*  K 4.9 5.3*   < > 4.2 3.3* 3.6  CL 99 97*   < > 98 98 100  CO2 22 21*   < > 20* 21* 21*  GLUCOSE 179* 94   < > 87 108* 112*  BUN 72* 69*   < > 68* 70* 70*  CREATININE 2.31* 2.50*   < > 2.97* 3.15* 3.14*  CALCIUM 8.1* 8.5*   < > 8.4* 8.1* 7.8*  MG 1.9 2.1  --   --   --   --   GFRNONAA 20* 18*   < > 15* 14* 14*  ANIONGAP 9 11   < > 12 12 12    < > = values in this interval not displayed.    Lipids No results for input(s): "CHOL", "TRIG", "HDL", "LABVLDL", "LDLCALC", "CHOLHDL" in the last 168 hours.  Hematology Recent Labs  Lab 04/03/23 0505 04/04/23 0544 04/06/23 0536  WBC 11.7* 11.7* 14.2*  RBC 2.97* 2.87* 3.17*  HGB 8.6* 8.2* 8.9*  HCT 26.4* 26.0* 28.2*  MCV 88.9 90.6 89.0  MCH 29.0 28.6 28.1  MCHC 32.6 31.5 31.6  RDW 13.8 13.8 13.7  PLT 204 201  237   Thyroid No results for input(s): "TSH", "FREET4" in the last 168 hours.  BNPNo results for input(s): "BNP", "PROBNP" in the last 168 hours.  DDimer No results for input(s): "DDIMER" in the last 168 hours.   Radiology    No results found.  Cardiac Studies   Limited echo yesterday April 17, 2023  1. Left ventricular ejection fraction, by estimation, is 35 to 40%. The  left ventricle has moderately decreased function. The left ventricle  demonstrates global hypokinesis. Left ventricular diastolic parameters are  indeterminate.   2. Right ventricular systolic function is normal. The right ventricular  size is normal. Tricuspid regurgitation signal is inadequate for assessing  PA pressure.   3. Left atrial size was moderately dilated.   4. Moderate pleural effusion in the left lateral region.   5. The mitral valve is normal in structure. Moderate to severe mitral  valve regurgitation. No evidence of mitral stenosis.   6. The aortic valve is normal in structure. Aortic valve regurgitation is  not visualized. No aortic stenosis is present.   7. The inferior vena cava is normal in size with <50% respiratory  variability, suggesting right atrial pressure of 8 mmHg.   Patient Profile     Ms. BERT GIVANS is a 88 y.o.  female with diabetes mellitus type II, hypertension, coronary artery disease, peripheral arterial disease, tremor, CVA, congestive heart failure and anemia who is admitted to [facility] on 03/25/2023 for COPD exacerbation (HCC) [J44.1] Chest pain [R07.9] Community acquired pneumonia, unspecified laterality [J18.9] Acute diastolic and systolic CHF  Assessment & Plan     1. Acute systolic HF -> cardiogenic shock - Echo 2023 EF 55-60% - Echo this admit read as EF 30-35%, concern for ischemic cardiomyopathy Peak troponin 1100 Cardiac catheterization deferred secondary to renal dysfunction creatinine of 2  Initial co-ox 44% CVP 21 (03/29/23) - Not candidate for  ACE/ARB/ARNI/MRA with kidney injury  Defer SGLT2 inhibitor in the setting of urine incontinence, high risk for yeast infection, discussed with nephrology on hydralazine 37.5 milligrams 3 times daily, isosorbide 30 daily Coreg 3.125 twice daily Will schedule follow-up with heart failure clinic 1 week after discharge   2. NSTEMI - continue ASA/statin Drop in ejection fraction concerning  for ischemic cardiomyopathy, no catheterization  in the setting of renal dysfunction On aspirin, beta-blocker, statin   3. AKI on CKD 3a - likely ATN/cardiorenal  - Scr 1.5 -> 2.8 -> 2.7 -> 2.4 -> 2.1 -> 2.3----2.5--2.63- 3.0 - renal u/s 3/21 with medico-renal disease.  Diuretics on hold Nephrology following   4. Acute hypoxic respiratory failure COPD, heart failure, pleural effusions contributing - s/p R thora on 6/3 with 700cc out  Off oxygen, with ambulation next to the bed sats 94% on room air   5. PAF  Maintaining normal sinus rhythm Amiodarone 200 twice daily, Eliquis 2.5 twice daily, carvedilol 3.125 twice daily   6. Hypervolemic hyponatremia Restrict free water, continue torsemide   7. Urinary retention Foley has been removed   8. DNR/DNI/no dialysis - confirmed with patient and her daughter Advanced heart failure recommending palliative care   family at the bedside     Total encounter time more than 50 minutes  Greater than 50% was spent in counseling and coordination of care with the patient   For questions or updates, please contact [facility] Please consult [email or web address] for contact info under        Signed, Julien Nordmann, MD  04/08/2023, 11:02 AM

## 2023-04-08 NOTE — Inpatient Diabetes Management (Signed)
Inpatient Diabetes Program Recommendations  AACE/ADA: New Consensus Statement on Inpatient Glycemic Control   Target Ranges:  Prepandial:   less than 140 mg/dL      Peak postprandial:   less than 180 mg/dL (1-2 hours)      Critically ill patients:  140 - 180 mg/dL    Latest Reference Range & Units 04/07/23 08:09 04/07/23 11:39 04/07/23 17:56 04/07/23 20:15  Glucose-Capillary 70 - 99 mg/dL 99 213 (H) 086 (H) 578 (H)   Review of Glycemic Control  Current orders for Inpatient glycemic control: Semglee 8 units QHS, Novolog 0-15 units TID with meals, Novolog 0-5 units QHS  Inpatient Diabetes Program Recommendations:    Insulin: Please consider ordering Novolog 2 units TID with meals for meal coverage if patient eats at least 50% of meals.  Thanks, Orlando Penner, RN, MSN, CDCES Diabetes Coordinator Inpatient Diabetes Program [phone] (Team Pager from 8am to 5pm)

## 2023-04-08 NOTE — Progress Notes (Signed)
[facility]  - [place] [facility]   PATIENT NAME: Jill Hudson    MR#:  [id number]  DATE OF BIRTH:  08-13-1935  SUBJECTIVE:   Seen earlier no family at bedside. Patient sitting up in bed and eating breakfast. No new complaints.   VITALS:  Blood pressure (!) 125/49, pulse 64, temperature 97.9 F (36.6 C), resp. rate 16, height 5' (1.524 m), weight 58.4 kg, SpO2 94 %.  PHYSICAL EXAMINATION:   GENERAL:  88 y.o.-year-old patient with no acute distress.  LUNGS: decreased breath sounds bilaterally, no wheezing CARDIOVASCULAR: S1, S2 normal. No murmur   ABDOMEN: Soft, nontender, nondistended. Bowel sounds present.  EXTREMITIES: No  edema b/l.    NEUROLOGIC: nonfocal  patient is alert and awake LABORATORY PANEL:  CBC Recent Labs  Lab 04/06/23 0536  WBC 14.2*  HGB 8.9*  HCT 28.2*  PLT 237     Chemistries  Recent Labs  Lab 04/03/23 0505 04/04/23 0544 04/08/23 0924  NA 129*   < > 132*  K 5.3*   < > 4.2  CL 97*   < > 99  CO2 21*   < > 20*  GLUCOSE 94   < > 135*  BUN 69*   < > 69*  CREATININE 2.50*   < > 3.53*  CALCIUM 8.5*   < > 8.4*  MG 2.1  --   --    < > = values in this interval not displayed.    Assessment and Plan  64-year-old female with history of COPD, HTN, HLD, DM2, stroke, TIA, GERD, anxiety, PVD, CAD, MI, former tobacco use, CKD stage III, GI bleed, iron deficiency anemia, PAD status post PCI comes to the hospital with shortness of breath and hypoxia. Patient was found to have bilateral pulmonary edema /effusion with elevated BNP.   Cardiogenic shock [facility]) Cardiology took off milrinone drip 6/7.  Not on any pressors at this point.   Acute on chronic systolic CHF (congestive heart failure) (HCC) Acute respiratory failure with hypoxia (HCC) --Patient initially requiring BiPAP secondary to respiratory distress.  -- Currently on 2 L. --cont hydralazine, Imdur and low-dose Coreg.  No ACE/ARB or spironolactone with her worsening kidney  function. --6/12--holding lasix today per cardiology --6/13--remains off lasix today. Await cards to place dose for d/c purpose   Paroxysmal atrial fibrillation (HCC) Patient converted to normal sinus rhythm.  Taken off amiodarone drip and switched to oral on 6/7.  Heparin drip converted over to Eliquis on 6/7.   Acute kidney injury superimposed on CKD (HCC) --AKI on CKD stage IIIa.  Creatinine now 3.15.--3.14 (likley new baseline) --Nephrology will continue to talk with patient and family about next steps. -- Per nephrology patient is not the best candidate for dialysis.   NSTEMI (non-ST elevated myocardial infarction) (HCC) --With creatinine above 2, medical management.  -- Continue aspirin, Imdur Coreg and low-dose Crestor.   Type II diabetes mellitus with renal manifestations (HCC) --Last hemoglobin A1c 7.9.  Hypoglycemic episode the other morning.  Decreased Semglee insulin down to 8 units at night.   --Continue sliding scale.   COPD exacerbation (HCC) --Continue nebulizers.   Essential hypertension --Continue hydralazine, Imdur and low-dose Coreg.   History of CVA (cerebrovascular accident) --Patient on Eliquis for stroke prevention   HLD (hyperlipidemia) --On Crestor   Hyponatremia --Sodium 131.   Iron deficiency anemia Last hemoglobin 8.9.  Continue to monitor.   Acute urinary retention Patient required in and out catheter this morning.    Hyperkalemia  Previously treated.   Pleural effusion Right thoracentesis removed 700 mL of fluid.  This was done by interventional radiology on 6/3.   Hypokalemia Today with hypokalemia.  Since patient back on IV Lasix will give some potassium supplementation.    Procedures: Family communication :dter Maralyn Sago Consults : palliative care, [facility] cardiology and nephrology CODE STATUS: DNR DNI DVT Prophylaxis : Level of care: Progressive Status is: Inpatient Remains inpatient appropriate because: congestive heart  failure monitoring need for Lasix and also monitoring creatinine. Discussed with cardiology and nephrology regarding discharge plan. Patient will be going to rehab.  TOTAL TIME TAKING CARE OF THIS PATIENT: 35 minutes.  >50% time spent on counselling and coordination of care  Note: This dictation was prepared with Dragon dictation along with smaller phrase technology. Any transcriptional errors that result from this process are unintentional.  Enedina Finner M.D    [facility]   CC: Primary care physician; Reubin Milan, MD

## 2023-04-08 NOTE — Progress Notes (Signed)
[facility]  ROUNDING NOTE   Subjective:   [name] was admitted to [facility] on 03/25/2023 for COPD exacerbation [facility]) [J44.1] Chest pain [R07.9] Community acquired pneumonia, unspecified laterality [J18.9]  Sitting up in chair Alert and oriented to self Tolerates small meals  Creatinine 3.53 (3.14) (3.15) (2.97) (2.63) (2.50)(2.31) BUN 70  Objective:  Vital signs in last 24 hours:  Temp:  [97.8 F (36.6 C)-98.1 F (36.7 C)] 97.9 F (36.6 C) (06/13 0815) Pulse Rate:  [56-66] 64 (06/13 1022) Resp:  [16-18] 16 (06/13 0815) BP: (111-125)/(44-99) 125/49 (06/13 0815) SpO2:  [94 %-100 %] 94 % (06/13 1022) Weight:  [58.4 kg] 58.4 kg (06/13 0841)  Weight change:  Filed Weights   04/05/23 0500 04/06/23 0500 04/08/23 0841  Weight: 61.3 kg 60.1 kg 58.4 kg    Intake/Output: I/O last 3 completed shifts: In: 370 [P.O.:370] Out: 950 [Urine:950]   Intake/Output this shift:  Total I/O In: 340 [P.O.:340] Out: 160 [Urine:160]  Physical Exam: General: NAD, laying in bed  Head: Normocephalic, atraumatic. Moist oral mucosal membranes  Eyes: Anicteric  Lungs:  Mild  wheezing  Heart: regular  Abdomen:  Soft, nontender  Extremities:  No peripheral edema.  Neurologic: Alert and oriented, moving all four extremities  Skin: warm        Basic Metabolic Panel: Recent Labs  Lab 04/02/23 0406 04/03/23 0505 04/04/23 0544 04/05/23 0847 04/06/23 0536 04/07/23 0502 04/08/23 0924  NA 130* 129* 131* 130* 131* 133* 132*  K 4.9 5.3* 4.8 4.2 3.3* 3.6 4.2  CL 99 97* 100 98 98 100 99  CO2 22 21* 23 20* 21* 21* 20*  GLUCOSE 179* 94 68* 87 108* 112* 135*  BUN 72* 69* 70* 68* 70* 70* 69*  CREATININE 2.31* 2.50* 2.63* 2.97* 3.15* 3.14* 3.53*  CALCIUM 8.1* 8.5* 8.6* 8.4* 8.1* 7.8* 8.4*  MG 1.9 2.1  --   --   --   --   --      Liver Function Tests: No results for input(s): "AST", "ALT", "ALKPHOS", "BILITOT", "PROT", "ALBUMIN" in the last 168 hours.  No results for  input(s): "LIPASE", "AMYLASE" in the last 168 hours. No results for input(s): "AMMONIA" in the last 168 hours.  CBC: Recent Labs  Lab 04/02/23 0406 04/03/23 0505 04/04/23 0544 04/06/23 0536  WBC 14.3* 11.7* 11.7* 14.2*  HGB 8.3* 8.6* 8.2* 8.9*  HCT 26.2* 26.4* 26.0* 28.2*  MCV 88.8 88.9 90.6 89.0  PLT 215 204 201 237     Cardiac Enzymes: No results for input(s): "CKTOTAL", "CKMB", "CKMBINDEX", "TROPONINI" in the last 168 hours.  BNP: Invalid input(s): "POCBNP"  CBG: Recent Labs  Lab 04/07/23 1139 04/07/23 1756 04/07/23 2015 04/08/23 0819 04/08/23 1217  GLUCAP 227* 302* 311* 111* 245*     Microbiology: Results for orders placed or performed during the hospital encounter of 03/25/23  Blood Culture (routine x 2)     Status: None   Collection Time: 03/25/23 12:09 PM   Specimen: BLOOD  Result Value Ref Range Status   Specimen Description BLOOD LEFT [facility]  Final   Special Requests   Final    BOTTLES DRAWN AEROBIC AND ANAEROBIC Blood Culture adequate volume   Culture   Final    NO GROWTH 5 DAYS Performed at [facility], [address] [place], [place] [address]    Report Status 03/30/2023 FINAL  Final  Blood Culture (routine x 2)     Status: None   Collection Time: 03/25/23  12:10 PM   Specimen: BLOOD  Result Value Ref Range Status   Specimen Description BLOOD LEFT FA  Final   Special Requests   Final    BOTTLES DRAWN AEROBIC AND ANAEROBIC Blood Culture adequate volume   Culture   Final    NO GROWTH 5 DAYS Performed at [facility], [address] [place], [place] [address]    Report Status 03/30/2023 FINAL  Final  SARS Coronavirus 2 by RT PCR (hospital order, performed in [facility] hospital lab) *cepheid single result test* Anterior Nasal Swab     Status: None   Collection Time: 03/25/23  1:18 PM   Specimen: Anterior Nasal Swab  Result Value Ref Range Status   SARS Coronavirus 2 by RT PCR NEGATIVE NEGATIVE Final    Comment:  (NOTE) SARS-CoV-2 target nucleic acids are NOT DETECTED.  The SARS-CoV-2 RNA is generally detectable in upper and lower respiratory specimens during the acute phase of infection. The lowest concentration of SARS-CoV-2 viral copies this assay can detect is 250 copies / mL. A negative result does not preclude SARS-CoV-2 infection and should not be used as the sole basis for treatment or other patient management decisions.  A negative result may occur with improper specimen collection / handling, submission of specimen other than nasopharyngeal swab, presence of viral mutation(s) within the areas targeted by this assay, and inadequate number of viral copies (<250 copies / mL). A negative result must be combined with clinical observations, patient history, and epidemiological information.  Fact Sheet for Patients:   RoadLapTop.co.za  Fact Sheet for Healthcare Providers: http://kim-miller.com/  This test is not yet approved or  cleared by the [place] FDA and has been authorized for detection and/or diagnosis of SARS-CoV-2 by FDA under an Emergency Use Authorization (EUA).  This EUA will remain in effect (meaning this test can be used) for the duration of the COVID-19 declaration under Section 564(b)(1) of the Act, 21 U.S.C. section 360bbb-3(b)(1), unless the authorization is terminated or revoked sooner.  Performed at [facility], [address] [place], [place] [address]   MRSA Next Gen by PCR, Nasal     Status: None   Collection Time: 03/27/23  4:40 AM   Specimen: Nasal Mucosa; Nasal Swab  Result Value Ref Range Status   MRSA by PCR Next Gen NOT DETECTED NOT DETECTED Final    Comment: (NOTE) The GeneXpert MRSA Assay (FDA approved for NASAL specimens only), is one component of a comprehensive MRSA colonization surveillance program. It is not intended to diagnose MRSA infection nor to guide or monitor treatment for MRSA  infections. Test performance is not FDA approved in patients less than 54 years old. Performed at [facility], [address] [place], [place] [address]   Respiratory (~20 pathogens) panel by PCR     Status: None   Collection Time: 03/28/23  4:00 PM   Specimen: Nasopharyngeal Swab; Respiratory  Result Value Ref Range Status   Adenovirus NOT DETECTED NOT DETECTED Final   Coronavirus 229E NOT DETECTED NOT DETECTED Final    Comment: (NOTE) The Coronavirus on the Respiratory Panel, DOES NOT test for the novel  Coronavirus (2019 nCoV)    Coronavirus HKU1 NOT DETECTED NOT DETECTED Final   Coronavirus NL63 NOT DETECTED NOT DETECTED Final   Coronavirus OC43 NOT DETECTED NOT DETECTED Final   Metapneumovirus NOT DETECTED NOT DETECTED Final   Rhinovirus / Enterovirus NOT DETECTED NOT DETECTED Final   Influenza A NOT DETECTED NOT DETECTED Final  Influenza B NOT DETECTED NOT DETECTED Final   Parainfluenza Virus 1 NOT DETECTED NOT DETECTED Final   Parainfluenza Virus 2 NOT DETECTED NOT DETECTED Final   Parainfluenza Virus 3 NOT DETECTED NOT DETECTED Final   Parainfluenza Virus 4 NOT DETECTED NOT DETECTED Final   Respiratory Syncytial Virus NOT DETECTED NOT DETECTED Final   Bordetella pertussis NOT DETECTED NOT DETECTED Final   Bordetella Parapertussis NOT DETECTED NOT DETECTED Final   Chlamydophila pneumoniae NOT DETECTED NOT DETECTED Final   Mycoplasma pneumoniae NOT DETECTED NOT DETECTED Final    Comment: Performed at [facility], [address], [place], [place] [address]  Body fluid culture w Gram Stain     Status: None   Collection Time: 03/29/23 10:27 AM   Specimen: PATH Cytology Pleural fluid  Result Value Ref Range Status   Specimen Description   Final    PLEURAL Performed at [facility], [address] [place], [place] [address]    Special Requests   Final    NONE Performed at [facility], [address] [place], [place] [address]     Gram Stain NO WBC SEEN NO ORGANISMS SEEN   Final   Culture   Final    NO GROWTH 3 DAYS Performed at [facility], [address] [place], [place] [address]    Report Status 04/01/2023 FINAL  Final  MRSA Next Gen by PCR, Nasal     Status: None   Collection Time: 03/29/23  3:00 PM   Specimen: Nasal Mucosa; Nasal Swab  Result Value Ref Range Status   MRSA by PCR Next Gen NOT DETECTED NOT DETECTED Final    Comment: (NOTE) The GeneXpert MRSA Assay (FDA approved for NASAL specimens only), is one component of a comprehensive MRSA colonization surveillance program. It is not intended to diagnose MRSA infection nor to guide or monitor treatment for MRSA infections. Test performance is not FDA approved in patients less than 24 years old. Performed at [facility], [address] [place], [place] [address]   Group A Strep by PCR     Status: None   Collection Time: 03/30/23  6:00 PM   Specimen: Throat; Sterile Swab  Result Value Ref Range Status   Group A Strep by PCR NOT DETECTED NOT DETECTED Final    Comment: Performed at [facility], [address] [place], [place] [address]    Coagulation Studies: No results for input(s): "LABPROT", "INR" in the last 72 hours.   Urinalysis: No results for input(s): "COLORURINE", "LABSPEC", "PHURINE", "GLUCOSEU", "HGBUR", "BILIRUBINUR", "KETONESUR", "PROTEINUR", "UROBILINOGEN", "NITRITE", "LEUKOCYTESUR" in the last 72 hours.  Invalid input(s): "APPERANCEUR"    Imaging: No results found.   Medications:      amiodarone  200 mg Oral BID   apixaban  2.5 mg Oral BID   arformoterol  15 mcg Nebulization BID   aspirin EC  81 mg Oral Daily   carvedilol  3.125 mg Oral BID WC   Chlorhexidine Gluconate Cloth  6 each Topical Daily   cyanocobalamin  1,000 mcg Oral Daily   feeding supplement (NEPRO CARB STEADY)  237 mL Oral BID BM   gabapentin  100 mg Oral QHS   hydrALAZINE  37.5 mg Oral Q8H   insulin aspart  0-15 Units  Subcutaneous TID WC   insulin aspart  0-5 Units Subcutaneous QHS   insulin aspart  2 Units Subcutaneous TID WC   insulin glargine-yfgn  8 Units Subcutaneous QHS  isosorbide mononitrate  30 mg Oral Daily   multivitamin  1 tablet Oral BID   nystatin   Topical TID   polyethylene glycol  17 g Oral Daily   potassium chloride  10 mEq Oral BID   promethazine  6.25 mg Oral QHS   revefenacin  175 mcg Nebulization Daily   rosuvastatin  10 mg Oral Once per day on Mon Wed Fri Sat   acetaminophen, alum & mag hydroxide-simeth, dextromethorphan-guaiFENesin, hydrALAZINE, levalbuterol, melatonin, ondansetron (ZOFRAN) IV, mouth rinse, oxyCODONE, phenol, senna-docusate, traZODone  Assessment/ Plan:  Ms. JADENE STEMMER is a 88 y.o.  female with diabetes mellitus type II, hypertension, coronary artery disease, peripheral arterial disease, tremor, CVA, congestive heart failure , anxiety, peripheral arterial diease and anemia who is admitted to [facility] on 03/25/2023 for COPD exacerbation (HCC) [J44.1] Chest pain [R07.9] Community acquired pneumonia, unspecified laterality [J18.9]  Acute kidney injury with acute metabolic acidosis on chronic kidney disease stage IIIB: baseline creatinine of 1.48, GFR of 34 on 12/09/2022. Acute kidney injury secondary to acute cardiorenal syndrome . Chronic kidney disease secondary to hypertension.  Renal function continues to decline. Palliative states patient would like to continue all treatment. No acute need for dialysis.    Hypertension with chronic kidney disease: with chronic systolic and diastolic congestive heart failure.  Appreciate cardiology input.  Heart failure regimen includes isosorbide/hydralazine Hesitant to start SGLT2 inhibitor as it will increase these occurrences.  Hyponatremia: Sodium level 132.    4.  Hypokalemia Potassium corrected     LOS: 14   6/13/20243:02 PM

## 2023-04-09 DIAGNOSIS — R570 Cardiogenic shock: Secondary | ICD-10-CM | POA: Diagnosis not present

## 2023-04-09 DIAGNOSIS — Z7189 Other specified counseling: Secondary | ICD-10-CM | POA: Diagnosis not present

## 2023-04-09 LAB — GLUCOSE, CAPILLARY
Glucose-Capillary: 77 mg/dL (ref 70–99)
Glucose-Capillary: 169 mg/dL — ABNORMAL HIGH (ref 70–99)
Glucose-Capillary: 133 mg/dL — ABNORMAL HIGH (ref 70–99)
Glucose-Capillary: 112 mg/dL — ABNORMAL HIGH (ref 70–99)

## 2023-04-09 LAB — CBC
HCT: 26.8 % — ABNORMAL LOW (ref 36.0–46.0)
Hemoglobin: 8.4 g/dL — ABNORMAL LOW (ref 12.0–15.0)
MCH: 28.1 pg (ref 26.0–34.0)
MCHC: 31.3 g/dL (ref 30.0–36.0)
MCV: 89.6 fL (ref 80.0–100.0)
Platelets: 223 10*3/uL (ref 150–400)
RBC: 2.99 MIL/uL — ABNORMAL LOW (ref 3.87–5.11)
RDW: 14 % (ref 11.5–15.5)
WBC: 10 10*3/uL (ref 4.0–10.5)
nRBC: 0 % (ref 0.0–0.2)

## 2023-04-09 LAB — BASIC METABOLIC PANEL
Anion gap: 9 (ref 5–15)
BUN: 76 mg/dL — ABNORMAL HIGH (ref 8–23)
CO2: 22 mmol/L (ref 22–32)
Calcium: 8.3 mg/dL — ABNORMAL LOW (ref 8.9–10.3)
Chloride: 100 mmol/L (ref 98–111)
Creatinine, Ser: 3.8 mg/dL — ABNORMAL HIGH (ref 0.44–1.00)
GFR, Estimated: 11 mL/min — ABNORMAL LOW (ref >60)
Glucose, Bld: 121 mg/dL — ABNORMAL HIGH (ref 70–99)
Potassium: 4.6 mmol/L (ref 3.5–5.1)
Sodium: 131 mmol/L — ABNORMAL LOW (ref 135–145)

## 2023-04-09 LAB — PHOSPHORUS: Phosphorus: 4.9 mg/dL — ABNORMAL HIGH (ref 2.5–4.6)

## 2023-04-09 LAB — MAGNESIUM: Magnesium: 2.3 mg/dL (ref 1.7–2.4)

## 2023-04-09 NOTE — Progress Notes (Signed)
OT Cancellation Note  Patient Details Name: Jill Hudson MRN: [id number] DOB: 04/12/1935   Cancelled Treatment:    Reason Eval/Treat Not Completed: Fatigue/lethargy limiting ability to participate (Pt recieved in bed, lunch just arrived. Pt polietly declines OT today as she is exhaused from earlier PT session. Will reattempt as able.)  Victorino Dike L. Gilmar Bua, OTR/L  04/09/23, 1:06 PM

## 2023-04-09 NOTE — Progress Notes (Signed)
Physical Therapy Treatment Patient Details Name: Jill Hudson MRN: [id number] DOB: May 10, 1935 Today's Date: 04/09/2023   History of Present Illness Jill Hudson is a 88yoF who presented to ED secondary to progressive SOB; admitted for management of A/C CHF exacerbation, AECOPD.  s/p thoracentesis (6/3) with removal of fluid; pending cardiac cath and TEE.    PT Comments    Patient is agreeable to PT. She is fatigued with minimal activity and requires frequent rest breaks. Several standing bouts performed. +2 person assistance required for taking a few steps forward and backward. Continues to anticipate the need for frequent supervision/assistance at discharge. PT will continue to follow to maximize independence and decrease caregiver burden.    Recommendations for follow up therapy are one component of a multi-disciplinary discharge planning process, led by the attending physician.  Recommendations may be updated based on patient status, additional functional criteria and insurance authorization.  Follow Up Recommendations  Can patient physically be transported by private vehicle: No    Assistance Recommended at Discharge Frequent or constant Supervision/Assistance  Patient can return home with the following A lot of help with walking and/or transfers;A little help with bathing/dressing/bathroom;Assist for transportation   Equipment Recommendations  Rolling walker (2 wheels)    Recommendations for Other Services       Precautions / Restrictions Precautions Precautions: Fall Restrictions Weight Bearing Restrictions: No     Mobility  Bed Mobility Overal bed mobility: Needs Assistance Bed Mobility: Supine to Sit, Sit to Supine     Supine to sit: Mod assist Sit to supine: Max assist   General bed mobility comments: increased assistance required for return to bed due to fatigue. cues for sequencing and technique. increased time and effort required    Transfers Overall  transfer level: Needs assistance Equipment used: Rolling walker (2 wheels) Transfers: Sit to/from Stand, Bed to chair/wheelchair/BSC Sit to Stand: Mod assist Stand pivot transfers: Max assist         General transfer comment: several standing bouts performed. increased assistance requried with fatigue.    Ambulation/Gait Ambulation/Gait assistance: Mod assist, +2 physical assistance Gait Distance (Feet): 2 Feet Assistive device: None Gait Pattern/deviations: Step-to pattern       General Gait Details: patient walked a short distance to the scale for a weight. + 2 person assistance required due to generalized weakness. patient is fatigued with minimal activity and required frequent rest breaks with activity   Stairs             Wheelchair Mobility    Modified Rankin (Stroke Patients Only)       Balance Overall balance assessment: Needs assistance Sitting-balance support: Feet supported Sitting balance-Leahy Scale: Fair     Standing balance support: Bilateral upper extremity supported Standing balance-Leahy Scale: Poor Standing balance comment: external support required                            Cognition Arousal/Alertness: Awake/alert Behavior During Therapy: WFL for tasks assessed/performed Overall Cognitive Status: Within Functional Limits for tasks assessed                                          Exercises      General Comments General comments (skin integrity, edema, etc.): patient had a bowel movement during session and required maximal assistance for pericare  Pertinent Vitals/Pain Pain Assessment Pain Assessment: No/denies pain    Home Living                          Prior Function            PT Goals (current goals can now be found in the care plan section) Acute Rehab PT Goals Patient Stated Goal: to go home PT Goal Formulation: With patient Time For Goal Achievement: 04/12/23 Potential  to Achieve Goals: Good Progress towards PT goals: Progressing toward goals    Frequency    Min 3X/week      PT Plan Current plan remains appropriate    Co-evaluation              AM-PAC PT "6 Clicks" Mobility   Outcome Measure  Help needed turning from your back to your side while in a flat bed without using bedrails?: A Lot Help needed moving from lying on your back to sitting on the side of a flat bed without using bedrails?: A Lot Help needed moving to and from a bed to a chair (including a wheelchair)?: A Lot Help needed standing up from a chair using your arms (e.g., wheelchair or bedside chair)?: A Lot Help needed to walk in hospital room?: A Little Help needed climbing 3-5 steps with a railing? : A Lot 6 Click Score: 13    End of Session Equipment Utilized During Treatment: Oxygen Activity Tolerance: Patient limited by fatigue;Patient tolerated treatment well;No increased pain Patient left: in chair;with call bell/phone within reach;with chair alarm set;with nursing/sitter in room Nurse Communication: Mobility status PT Visit Diagnosis: Muscle weakness (generalized) (M62.81);Difficulty in walking, not elsewhere classified (R26.2)     Time: 1610-9604 PT Time Calculation (min) (ACUTE ONLY): 44 min  Charges:  $Therapeutic Activity: 38-52 mins                     [name], PT, MPT    Ina Homes 04/09/2023, 10:47 AM

## 2023-04-09 NOTE — Progress Notes (Signed)
[facility] Progress Note  Patient: Jill Hudson    ZOX:[id number]  DOA: 03/25/2023     Date of Service: the patient was seen and examined on 04/09/2023  Chief Complaint  Patient presents with   Shortness of Breath   Brief hospital course: 88 year old female with history of COPD, HTN, HLD, DM2, stroke, TIA, GERD, anxiety, PVD, CAD, MI, former tobacco use, CKD stage III, GI bleed, iron deficiency anemia, PAD status post PCI comes to the hospital with shortness of breath and hypoxia. Patient was found to have bilateral pulmonary edema /effusion with elevated BNP.    Assessment and Plan:  Cardiogenic shock [facility]) Cardiology took off milrinone drip 6/7.  Not on any pressors at this point.   Acute on chronic systolic CHF (congestive heart failure) (HCC) Acute respiratory failure with hypoxia (HCC) --Patient initially requiring BiPAP secondary to respiratory distress.  S/p supplemental O2 inhalation, gradually weaned off currently on room air. --cont hydralazine, Imdur and low-dose Coreg.  No ACE/ARB or spironolactone with her worsening kidney function. --6/12--holding lasix today per cardiology --6/13--remains off lasix today. Await cards to place dose for d/c purpose   Paroxysmal atrial fibrillation (HCC) Patient converted to normal sinus rhythm.  Taken off amiodarone drip and switched to oral on 6/7.  Heparin drip converted over to Eliquis on 6/7.   Acute kidney injury superimposed on CKD (HCC) --AKI on CKD stage IIIa.  Creatinine now 3.15.--3.14 (likley new baseline) --Nephrology will continue to talk with patient and family about next steps. -- Per nephrology patient is not the best candidate for dialysis. Creatinine 3.8 elevated, continue to monitor  NSTEMI (non-ST elevated myocardial infarction) (HCC) --With creatinine above 2, medical management.  -- Continue aspirin, Imdur Coreg and low-dose Crestor.   Type II diabetes mellitus with renal manifestations (HCC) --Last  hemoglobin A1c 7.9.  Hypoglycemic episode the other morning.  Decreased Semglee insulin down to 8 units at night.   --Continue sliding scale.   COPD exacerbation (HCC) --Continue nebulizers.   Essential hypertension --Continue hydralazine, Imdur and low-dose Coreg.   History of CVA (cerebrovascular accident) --Patient on Eliquis for stroke prevention   HLD (hyperlipidemia) --On Crestor     Iron deficiency anemia Last hemoglobin 8.4.  Continue to monitor.   Acute urinary retention Patient required in and out catheter     Hyperkalemia Previously treated. Resolved Hypokalemia due to lasix, Resolved  Hyponatremia --Sodium 131. Monitor electrolytes   Pleural effusion Right thoracentesis removed 700 mL of fluid.  This was done by interventional radiology on 6/3.   Body mass index is 25.19 kg/m.  Interventions:  Consults : palliative care, [facility] cardiology and nephrology   Diet: Heart healthy diet DVT Prophylaxis: Therapeutic Anticoagulation with Eliquis    Advance goals of care discussion: DNR  Family Communication: family was Not present at bedside, at the time of interview.  The pt provided permission to discuss medical plan with the family. Opportunity was given to ask question and all questions were answered satisfactorily.   Disposition:  Pt is from Home, admitted with CHF, developed renal failure, still has elevated creatinine, which precludes a safe discharge. Discharge to SNF, when stable and cleared by nephro and cardiology.  Subjective: No significant events overnight, patient denies any worsening of shortness of breath, no chest pain or palpitation, no any other active issues.  Physical Exam: General: NAD, lying comfortably Appear in no distress, affect appropriate Eyes: PERRLA ENT: Oral Mucosa Clear, moist  Neck: no JVD,  Cardiovascular: S1 and  S2 Present, no Murmur,  Respiratory: good respiratory effort, Bilateral Air entry equal and Decreased, no  Crackles, no wheezes Abdomen: Bowel Sound present, Soft and no tenderness,  Skin: no rashes Extremities: mild Pedal edema, no calf tenderness Neurologic: without any new focal findings Gait not checked due to patient safety concerns  Vitals:   04/09/23 0834 04/09/23 0900 04/09/23 1207 04/09/23 1645  BP: (!) 110/55  114/69 (!) 129/54  Pulse: (!) 56 61 (!) 53 (!) 55  Resp: 16  16 16   Temp: 98 F (36.7 C)  97.6 F (36.4 C) 97.9 F (36.6 C)  TempSrc: Oral     SpO2: 100%  99% 96%  Weight:      Height:        Intake/Output Summary (Last 24 hours) at 04/09/2023 1650 Last data filed at 04/08/2023 1900 Gross per 24 hour  Intake 240 ml  Output --  Net 240 ml   Filed Weights   04/06/23 0500 04/08/23 0841 04/09/23 0730  Weight: 60.1 kg 58.4 kg 58.5 kg    Data Reviewed: I have personally reviewed and interpreted daily labs, tele strips, imagings as discussed above. I reviewed all nursing notes, pharmacy notes, vitals, pertinent old records I have discussed plan of care as described above with RN and patient/family.  CBC: Recent Labs  Lab 04/03/23 0505 04/04/23 0544 04/06/23 0536 04/09/23 1026  WBC 11.7* 11.7* 14.2* 10.0  HGB 8.6* 8.2* 8.9* 8.4*  HCT 26.4* 26.0* 28.2* 26.8*  MCV 88.9 90.6 89.0 89.6  PLT 204 201 237 223   Basic Metabolic Panel: Recent Labs  Lab 04/03/23 0505 04/04/23 0544 04/05/23 0847 04/06/23 0536 04/07/23 0502 04/08/23 0924 04/09/23 1026  NA 129*   < > 130* 131* 133* 132* 131*  K 5.3*   < > 4.2 3.3* 3.6 4.2 4.6  CL 97*   < > 98 98 100 99 100  CO2 21*   < > 20* 21* 21* 20* 22  GLUCOSE 94   < > 87 108* 112* 135* 121*  BUN 69*   < > 68* 70* 70* 69* 76*  CREATININE 2.50*   < > 2.97* 3.15* 3.14* 3.53* 3.80*  CALCIUM 8.5*   < > 8.4* 8.1* 7.8* 8.4* 8.3*  MG 2.1  --   --   --   --   --  2.3  PHOS  --   --   --   --   --   --  4.9*   < > = values in this interval not displayed.    Studies: No results found.  Scheduled Meds:  amiodarone  200 mg  Oral BID   apixaban  2.5 mg Oral BID   arformoterol  15 mcg Nebulization BID   aspirin EC  81 mg Oral Daily   carvedilol  3.125 mg Oral BID WC   Chlorhexidine Gluconate Cloth  6 each Topical Daily   cyanocobalamin  1,000 mcg Oral Daily   feeding supplement (NEPRO CARB STEADY)  237 mL Oral BID BM   gabapentin  100 mg Oral QHS   hydrALAZINE  37.5 mg Oral Q8H   insulin aspart  0-15 Units Subcutaneous TID WC   insulin aspart  0-5 Units Subcutaneous QHS   insulin aspart  2 Units Subcutaneous TID WC   insulin glargine-yfgn  8 Units Subcutaneous QHS   isosorbide mononitrate  30 mg Oral Daily   multivitamin  1 tablet Oral BID   nystatin   Topical TID   polyethylene  glycol  17 g Oral Daily   potassium chloride  10 mEq Oral BID   promethazine  6.25 mg Oral QHS   revefenacin  175 mcg Nebulization Daily   rosuvastatin  10 mg Oral Once per day on Mon Wed Fri Sat   Continuous Infusions: PRN Meds: acetaminophen, alum & mag hydroxide-simeth, dextromethorphan-guaiFENesin, hydrALAZINE, levalbuterol, melatonin, ondansetron (ZOFRAN) IV, mouth rinse, oxyCODONE, phenol, senna-docusate, traZODone  Time spent: 35 minutes  Author: Gillis Santa. MD [facility] 04/09/2023 4:50 PM  To reach On-call, see care teams to locate the attending and reach out to them via [email or web address]. If 7PM-7AM, please contact night-coverage If you still have difficulty reaching the attending provider, please page the [facility] (Director on Call) for [facility] on amion for assistance.

## 2023-04-09 NOTE — Progress Notes (Signed)
Civil engineer, contracting [facility]) Hospital Liaison Note:   (new referral for outpatient palliative services)  Notified by TOC/ Alfonso Ramus, LCSW, of patient/family request for [facility] Palliative Care services at [facility].   [facility] hospital liaison will follow patient for discharge disposition.  Please call with any hospice or outpatient palliative care related questions.  Thank you for the opportunity to participate in this patient's care.  Redge Gainer, [facility] Liaison [phone]

## 2023-04-09 NOTE — Consult Note (Signed)
   [facility] CM Inpatient Consult   04/09/2023  INFANT COTLER [birth year] [id number]  Primary Care Provider:  Dr. Judithann Graves [facility] Primary Care & Sports Medicine at [facility])  Patient is currently active with [facility] [[facility]] Care Management for chronic disease management services.  Patient has been engaged by a [facility].  Our community based plan of care has focused on disease management and community resource support.   Patient will receive a post hospital call and will be evaluated for assessments and disease process education.   Plan: Pending discharge disposition.  Inpatient Transition Of Care [TOC] team member to make aware that [facility] Care Management following.  Of note, [facility] Care Management services does not replace or interfere with any services that are needed or arranged by inpatient [facility] care management team.   For additional questions or referrals please contact:  Elliot Cousin, RN, BSN [facility] Liaison [place]   [facility]  Population Health Office Hours MTWF  8:00 am-6:00 pm Off on Thursday [phone] mobile [phone] [Office toll free line][facility] Office Hours are M-F 8:30 - 5 pm 24 hour nurse advise line [phone] Concierge  [email or web address]

## 2023-04-09 NOTE — TOC Progression Note (Addendum)
Transition of Care [facility]) - Progression Note    Patient Details  Name: Jill Hudson MRN: [id number] Date of Birth: September 29, 1935  Transition of Care [facility]) CM/SW Contact  Liliana Cline, LCSW Phone Number: 04/09/2023, 12:57 PM  Clinical Narrative:    Delfina Redwood with [facility] that patient will need Outpatient Palliative to follow per NP.  Per TOC handoff, Berkley Harvey will need to be started Sunday.   4:23- [facility] unable to take patients that are at [facility] as they have their own hospice agency per Vincent. [facility] Representative Morrie Sheldon to inform her, Morrie Sheldon confirms they have palliative care for the facility patients and can follow this patient.    Expected Discharge Plan: Skilled Nursing Facility Barriers to Discharge: Continued Medical Work up  Expected Discharge Plan and Services       Living arrangements for the past 2 months: Single Family Home                                       Social Determinants of Health (SDOH) Interventions SDOH Screenings   Food Insecurity: No Food Insecurity (03/25/2023)  Housing: Low Risk  (03/25/2023)  Transportation Needs: No Transportation Needs (03/25/2023)  Utilities: Not At Risk (03/25/2023)  Alcohol Screen: Low Risk  (03/11/2022)  Depression (PHQ2-9): High Risk (01/01/2023)  Financial Resource Strain: Low Risk  (01/01/2023)  Physical Activity: Insufficiently Active (03/11/2022)  Social Connections: Socially Integrated (05/12/2022)  Stress: No Stress Concern Present (03/11/2022)  Tobacco Use: Medium Risk (03/25/2023)    Readmission Risk Interventions     No data to display

## 2023-04-09 NOTE — Progress Notes (Signed)
[facility]  ROUNDING NOTE   Subjective:   Jill Hudson was admitted to [facility] on 03/25/2023 for COPD exacerbation [facility]) [J44.1] Chest pain [R07.9] Community acquired pneumonia, unspecified laterality [J18.9]  Laying in bed Alert and oriented Denies nausea and vomiting Room air  Creatinine 3.8 (3.53) (3.14) (3.15) (2.97) (2.63) (2.50)(2.31) BUN 76  Objective:  Vital signs in last 24 hours:  Temp:  [97.4 F (36.3 C)-98 F (36.7 C)] 97.6 F (36.4 C) (06/14 1207) Pulse Rate:  [52-61] 53 (06/14 1207) Resp:  [14-16] 16 (06/14 1207) BP: (110-122)/(46-69) 114/69 (06/14 1207) SpO2:  [94 %-100 %] 99 % (06/14 1207) Weight:  [58.5 kg] 58.5 kg (06/14 0730)  Weight change:  Filed Weights   04/06/23 0500 04/08/23 0841 04/09/23 0730  Weight: 60.1 kg 58.4 kg 58.5 kg    Intake/Output: I/O last 3 completed shifts: In: 580 [P.O.:580] Out: 160 [Urine:160]   Intake/Output this shift:  No intake/output data recorded.  Physical Exam: General: NAD, laying in bed  Head: Normocephalic, atraumatic. Moist oral mucosal membranes  Eyes: Anicteric  Lungs:  Diminished in bases, room air  Heart: regular  Abdomen:  Soft, nontender  Extremities:  trace peripheral edema.  Neurologic: Alert and oriented, moving all four extremities  Skin: warm        Basic Metabolic Panel: Recent Labs  Lab 04/03/23 0505 04/04/23 0544 04/05/23 0847 04/06/23 0536 04/07/23 0502 04/08/23 0924 04/09/23 1026  NA 129*   < > 130* 131* 133* 132* 131*  K 5.3*   < > 4.2 3.3* 3.6 4.2 4.6  CL 97*   < > 98 98 100 99 100  CO2 21*   < > 20* 21* 21* 20* 22  GLUCOSE 94   < > 87 108* 112* 135* 121*  BUN 69*   < > 68* 70* 70* 69* 76*  CREATININE 2.50*   < > 2.97* 3.15* 3.14* 3.53* 3.80*  CALCIUM 8.5*   < > 8.4* 8.1* 7.8* 8.4* 8.3*  MG 2.1  --   --   --   --   --  2.3  PHOS  --   --   --   --   --   --  4.9*   < > = values in this interval not displayed.     Liver Function Tests: No results for  input(s): "AST", "ALT", "ALKPHOS", "BILITOT", "PROT", "ALBUMIN" in the last 168 hours.  No results for input(s): "LIPASE", "AMYLASE" in the last 168 hours. No results for input(s): "AMMONIA" in the last 168 hours.  CBC: Recent Labs  Lab 04/03/23 0505 04/04/23 0544 04/06/23 0536 04/09/23 1026  WBC 11.7* 11.7* 14.2* 10.0  HGB 8.6* 8.2* 8.9* 8.4*  HCT 26.4* 26.0* 28.2* 26.8*  MCV 88.9 90.6 89.0 89.6  PLT 204 201 237 223     Cardiac Enzymes: No results for input(s): "CKTOTAL", "CKMB", "CKMBINDEX", "TROPONINI" in the last 168 hours.  BNP: Invalid input(s): "POCBNP"  CBG: Recent Labs  Lab 04/08/23 1217 04/08/23 1607 04/08/23 2116 04/09/23 0844 04/09/23 1206  GLUCAP 245* 137* 138* 77 169*     Microbiology: Results for orders placed or performed during the hospital encounter of 03/25/23  Blood Culture (routine x 2)     Status: None   Collection Time: 03/25/23 12:09 PM   Specimen: BLOOD  Result Value Ref Range Status   Specimen Description BLOOD LEFT [facility]  Final   Special Requests   Final    BOTTLES DRAWN AEROBIC AND ANAEROBIC  Blood Culture adequate volume   Culture   Final    NO GROWTH 5 DAYS Performed at [facility], [address] [place], [place] [address]    Report Status 03/30/2023 FINAL  Final  Blood Culture (routine x 2)     Status: None   Collection Time: 03/25/23 12:10 PM   Specimen: BLOOD  Result Value Ref Range Status   Specimen Description BLOOD LEFT FA  Final   Special Requests   Final    BOTTLES DRAWN AEROBIC AND ANAEROBIC Blood Culture adequate volume   Culture   Final    NO GROWTH 5 DAYS Performed at [facility], [address] [place], [place] [address]    Report Status 03/30/2023 FINAL  Final  SARS Coronavirus 2 by RT PCR (hospital order, performed in [facility] hospital lab) *cepheid single result test* Anterior Nasal Swab     Status: None   Collection Time: 03/25/23  1:18 PM   Specimen: Anterior Nasal Swab  Result  Value Ref Range Status   SARS Coronavirus 2 by RT PCR NEGATIVE NEGATIVE Final    Comment: (NOTE) SARS-CoV-2 target nucleic acids are NOT DETECTED.  The SARS-CoV-2 RNA is generally detectable in upper and lower respiratory specimens during the acute phase of infection. The lowest concentration of SARS-CoV-2 viral copies this assay can detect is 250 copies / mL. A negative result does not preclude SARS-CoV-2 infection and should not be used as the sole basis for treatment or other patient management decisions.  A negative result may occur with improper specimen collection / handling, submission of specimen other than nasopharyngeal swab, presence of viral mutation(s) within the areas targeted by this assay, and inadequate number of viral copies (<250 copies / mL). A negative result must be combined with clinical observations, patient history, and epidemiological information.  Fact Sheet for Patients:   RoadLapTop.co.za  Fact Sheet for Healthcare Providers: http://kim-miller.com/  This test is not yet approved or  cleared by the [place] FDA and has been authorized for detection and/or diagnosis of SARS-CoV-2 by FDA under an Emergency Use Authorization (EUA).  This EUA will remain in effect (meaning this test can be used) for the duration of the COVID-19 declaration under Section 564(b)(1) of the Act, 21 U.S.C. section 360bbb-3(b)(1), unless the authorization is terminated or revoked sooner.  Performed at [facility], [address] [place], [place] [address]   MRSA Next Gen by PCR, Nasal     Status: None   Collection Time: 03/27/23  4:40 AM   Specimen: Nasal Mucosa; Nasal Swab  Result Value Ref Range Status   MRSA by PCR Next Gen NOT DETECTED NOT DETECTED Final    Comment: (NOTE) The GeneXpert MRSA Assay (FDA approved for NASAL specimens only), is one component of a comprehensive MRSA colonization  surveillance program. It is not intended to diagnose MRSA infection nor to guide or monitor treatment for MRSA infections. Test performance is not FDA approved in patients less than 75 years old. Performed at [facility], [address] [place], [place] [address]   Respiratory (~20 pathogens) panel by PCR     Status: None   Collection Time: 03/28/23  4:00 PM   Specimen: Nasopharyngeal Swab; Respiratory  Result Value Ref Range Status   Adenovirus NOT DETECTED NOT DETECTED Final   Coronavirus 229E NOT DETECTED NOT DETECTED Final    Comment: (NOTE) The Coronavirus on the Respiratory Panel, DOES NOT test for the novel  Coronavirus (2019 nCoV)  Coronavirus HKU1 NOT DETECTED NOT DETECTED Final   Coronavirus NL63 NOT DETECTED NOT DETECTED Final   Coronavirus OC43 NOT DETECTED NOT DETECTED Final   Metapneumovirus NOT DETECTED NOT DETECTED Final   Rhinovirus / Enterovirus NOT DETECTED NOT DETECTED Final   Influenza A NOT DETECTED NOT DETECTED Final   Influenza B NOT DETECTED NOT DETECTED Final   Parainfluenza Virus 1 NOT DETECTED NOT DETECTED Final   Parainfluenza Virus 2 NOT DETECTED NOT DETECTED Final   Parainfluenza Virus 3 NOT DETECTED NOT DETECTED Final   Parainfluenza Virus 4 NOT DETECTED NOT DETECTED Final   Respiratory Syncytial Virus NOT DETECTED NOT DETECTED Final   Bordetella pertussis NOT DETECTED NOT DETECTED Final   Bordetella Parapertussis NOT DETECTED NOT DETECTED Final   Chlamydophila pneumoniae NOT DETECTED NOT DETECTED Final   Mycoplasma pneumoniae NOT DETECTED NOT DETECTED Final    Comment: Performed at [facility], [address], [place], [place] [address]  Body fluid culture w Gram Stain     Status: None   Collection Time: 03/29/23 10:27 AM   Specimen: PATH Cytology Pleural fluid  Result Value Ref Range Status   Specimen Description   Final    PLEURAL Performed at [facility], [address] [place], [place] [address]    Special  Requests   Final    NONE Performed at [facility], [address] [place], [place] [address]    Gram Stain NO WBC SEEN NO ORGANISMS SEEN   Final   Culture   Final    NO GROWTH 3 DAYS Performed at [facility], [address], [place], [place] [address]    Report Status 04/01/2023 FINAL  Final  MRSA Next Gen by PCR, Nasal     Status: None   Collection Time: 03/29/23  3:00 PM   Specimen: Nasal Mucosa; Nasal Swab  Result Value Ref Range Status   MRSA by PCR Next Gen NOT DETECTED NOT DETECTED Final    Comment: (NOTE) The GeneXpert MRSA Assay (FDA approved for NASAL specimens only), is one component of a comprehensive MRSA colonization surveillance program. It is not intended to diagnose MRSA infection nor to guide or monitor treatment for MRSA infections. Test performance is not FDA approved in patients less than 71 years old. Performed at [facility], [address] [place], [place] [address]   Group A Strep by PCR     Status: None   Collection Time: 03/30/23  6:00 PM   Specimen: Throat; Sterile Swab  Result Value Ref Range Status   Group A Strep by PCR NOT DETECTED NOT DETECTED Final    Comment: Performed at [facility], [address] [place], [place] [address]    Coagulation Studies: No results for input(s): "LABPROT", "INR" in the last 72 hours.   Urinalysis: No results for input(s): "COLORURINE", "LABSPEC", "PHURINE", "GLUCOSEU", "HGBUR", "BILIRUBINUR", "KETONESUR", "PROTEINUR", "UROBILINOGEN", "NITRITE", "LEUKOCYTESUR" in the last 72 hours.  Invalid input(s): "APPERANCEUR"    Imaging: No results found.   Medications:      amiodarone  200 mg Oral BID   apixaban  2.5 mg Oral BID   arformoterol  15 mcg Nebulization BID   aspirin EC  81 mg Oral Daily   carvedilol  3.125 mg Oral BID WC   Chlorhexidine Gluconate Cloth  6 each Topical Daily   cyanocobalamin  1,000 mcg Oral Daily   feeding supplement (NEPRO CARB STEADY)   237 mL Oral BID BM  gabapentin  100 mg Oral QHS   hydrALAZINE  37.5 mg Oral Q8H   insulin aspart  0-15 Units Subcutaneous TID WC   insulin aspart  0-5 Units Subcutaneous QHS   insulin aspart  2 Units Subcutaneous TID WC   insulin glargine-yfgn  8 Units Subcutaneous QHS   isosorbide mononitrate  30 mg Oral Daily   multivitamin  1 tablet Oral BID   nystatin   Topical TID   polyethylene glycol  17 g Oral Daily   potassium chloride  10 mEq Oral BID   promethazine  6.25 mg Oral QHS   revefenacin  175 mcg Nebulization Daily   rosuvastatin  10 mg Oral Once per day on Mon Wed Fri Sat   acetaminophen, alum & mag hydroxide-simeth, dextromethorphan-guaiFENesin, hydrALAZINE, levalbuterol, melatonin, ondansetron (ZOFRAN) IV, mouth rinse, oxyCODONE, phenol, senna-docusate, traZODone  Assessment/ Plan:  Jill Hudson is a 88 y.o.  female with diabetes mellitus type II, hypertension, coronary artery disease, peripheral arterial disease, tremor, CVA, congestive heart failure , anxiety, peripheral arterial diease and anemia who is admitted to [facility] on 03/25/2023 for COPD exacerbation (HCC) [J44.1] Chest pain [R07.9] Community acquired pneumonia, unspecified laterality [J18.9]  Acute kidney injury with acute metabolic acidosis on chronic kidney disease stage IIIB: baseline creatinine of 1.48, GFR of 34 on 12/09/2022. Acute kidney injury secondary to acute cardiorenal syndrome . Chronic kidney disease secondary to hypertension.  Creatinine continues to slowly increase No acute indication for dialysis Will continue to monitor Palliative following, patient would like to continue all therapies at this time   Hypertension with chronic kidney disease: with chronic systolic and diastolic congestive heart failure.  Appreciate cardiology input.  Heart failure regimen includes isosorbide/hydralazine and carvedilol Hesitant to start SGLT2 inhibitor as it will increase these occurrences.  Hyponatremia: Sodium  level 131  4.  Hypokalemia Potassium corrected     LOS: 15   6/14/20242:21 PM

## 2023-04-09 NOTE — Progress Notes (Addendum)
Daily Progress Note   Patient Name: Jill Hudson       Date: 04/09/2023 DOB: 01-17-1935  Age: 88 y.o. MRN#: [id number] Attending Physician: Gillis Santa, MD Primary Care Physician: Reubin Milan, MD Admit Date: 03/25/2023  Reason for Consultation/Follow-up: Establishing goals of care  Subjective: Notes and labs reviewed.  Renal function continues to decline.  In to see patient.  No family at bedside. She states she is continuing to take 1 day at a time.  She discusses family and friends who have died and states that she has outlived them all.   She states she has been updated by care teams, and is aware that her renal function is worsening. Reviewed the effects of worsening renal function both physically and cognitively. She states she understands her prognosis particularly if her renal function continues to decline. She discusses being a woman of faith, and discusses awareness that she cannot live forever. With discussion of care moving forward she advises that she still desires to speak with her family members privately and individually.    Recommend outpatient palliative with transition to hospice level care when patient is ready.   Length of Stay: 15  Current Medications: Scheduled Meds:   amiodarone  200 mg Oral BID   apixaban  2.5 mg Oral BID   arformoterol  15 mcg Nebulization BID   aspirin EC  81 mg Oral Daily   carvedilol  3.125 mg Oral BID WC   Chlorhexidine Gluconate Cloth  6 each Topical Daily   cyanocobalamin  1,000 mcg Oral Daily   feeding supplement (NEPRO CARB STEADY)  237 mL Oral BID BM   gabapentin  100 mg Oral QHS   hydrALAZINE  37.5 mg Oral Q8H   insulin aspart  0-15 Units Subcutaneous TID WC   insulin aspart  0-5 Units Subcutaneous QHS   insulin aspart  2 Units  Subcutaneous TID WC   insulin glargine-yfgn  8 Units Subcutaneous QHS   isosorbide mononitrate  30 mg Oral Daily   multivitamin  1 tablet Oral BID   nystatin   Topical TID   polyethylene glycol  17 g Oral Daily   potassium chloride  10 mEq Oral BID   promethazine  6.25 mg Oral QHS   revefenacin  175 mcg Nebulization Daily   rosuvastatin  10  mg Oral Once per day on Mon Wed Fri Sat    Continuous Infusions:   PRN Meds: acetaminophen, alum & mag hydroxide-simeth, dextromethorphan-guaiFENesin, hydrALAZINE, levalbuterol, melatonin, ondansetron (ZOFRAN) IV, mouth rinse, oxyCODONE, phenol, senna-docusate, traZODone  Physical Exam Pulmonary:     Effort: Pulmonary effort is normal.  Neurological:     Mental Status: She is alert.             Vital Signs: BP (!) 110/55 (BP Location: Left Arm)   Pulse 61   Temp 98 F (36.7 C) (Oral)   Resp 16   Ht 5' (1.524 m)   Wt 58.5 kg   SpO2 100%   BMI 25.19 kg/m  SpO2: SpO2: 100 % O2 Device: O2 Device: Room Air O2 Flow Rate: O2 Flow Rate (L/min): 0 L/min  Intake/output summary:  Intake/Output Summary (Last 24 hours) at 04/09/2023 1148 Last data filed at 04/08/2023 1900 Gross per 24 hour  Intake 480 ml  Output 150 ml  Net 330 ml   LBM: Last BM Date : 04/09/23 Baseline Weight: Weight: 56.2 kg Most recent weight: Weight: 58.5 kg    Patient Active Problem List   Diagnosis Date Noted   Acute urinary retention 04/04/2023   Community acquired pneumonia 04/03/2023   Hyperkalemia 04/03/2023   Pleural effusion 04/02/2023   Acute on chronic systolic CHF (congestive heart failure) (HCC) 03/31/2023   Paroxysmal atrial fibrillation (HCC) 03/31/2023   Hypokalemia 03/31/2023   Cardiogenic shock (HCC) 03/28/2023   COPD exacerbation (HCC) 03/25/2023   HLD (hyperlipidemia) 03/25/2023   Type II diabetes mellitus with renal manifestations (HCC) 03/25/2023   CAD (coronary artery disease) 03/25/2023   Iron deficiency anemia 03/25/2023   PAD  (peripheral artery disease) (HCC) 03/25/2023   Acquired thrombophilia (HCC) 03/20/2021   Mood disorder (HCC) 05/02/2020   Chronic heart failure with preserved ejection fraction (HCC) 04/12/2020   Diverticulosis of large intestine without perforation or abscess with bleeding 03/27/2020   COPD (chronic obstructive pulmonary disease) with chronic bronchitis    NSTEMI (non-ST elevated myocardial infarction) (HCC)    Acute respiratory failure with hypoxia (HCC) 02/18/2020   CKD (chronic kidney disease), stage IIIa 02/18/2020   Hyponatremia 02/18/2020   Malnutrition of mild degree (HCC) 01/08/2020   Acute kidney injury superimposed on CKD (HCC)    Age-related macular degeneration, dry, left eye 06/21/2019   Age-related macular degeneration, wet, right eye (HCC) 06/21/2019   Moderate nonproliferative diabetic retinopathy associated with type 2 diabetes mellitus (HCC) 06/21/2019   Lumbosacral radiculopathy at L4 05/01/2019   Underweight 05/01/2019   Encounter for long-term (current) use of aspirin 10/31/2018   Long term current use of oral hypoglycemic drug 10/31/2018   Encounter for long-term (current) use of insulin (HCC) 10/31/2018   Peptic ulcer disease 08/11/2018   Leg pain 07/03/2017   Carpal tunnel syndrome on both sides 05/05/2017   Myalgia due to HMG CoA reductase inhibitor 05/05/2017   History of CVA (cerebrovascular accident) 02/15/2017   Degenerative disc disease, lumbar 12/21/2016   Atherosclerosis of native arteries of extremity with intermittent claudication (HCC) 12/20/2016   Bilateral carotid artery stenosis 12/20/2016   Occlusion and stenosis of bilateral carotid arteries 12/20/2016   Hip bursitis 05/18/2016   Elevated TSH 01/18/2016   CKD stage 3 due to type 2 diabetes mellitus (HCC) 01/16/2016   Senile ecchymosis 01/16/2016   Type II diabetes mellitus with complication (HCC)    GERD (gastroesophageal reflux disease) 07/24/2015   PVD (peripheral vascular disease) (HCC)  05/17/2015  Neoplasm of uncertain behavior of skin 05/17/2015   Retinopathy, diabetic, proliferative (HCC) 04/11/2015   Hyperlipidemia associated with type 2 diabetes mellitus (HCC) 04/11/2015   Essential hypertension 04/11/2015   Generalized OA 04/11/2015   Proliferative diabetic retinopathy(362.02) 04/11/2015   Arteriosclerosis of coronary artery 05/29/2013   Hypertensive heart disease without CHF 05/29/2013    Palliative Care Assessment & Plan     Recommendations/Plan: Recommend outpatient palliative, patient is amenable.  Transition to hospice level care when patient is ready.  Code Status:    Code Status Orders  (From admission, onward)           Start     Ordered   03/31/23 1502  Do not attempt resuscitation (DNR)  Continuous       Question Answer Comment  If patient has no pulse and is not breathing Do Not Attempt Resuscitation   If patient has a pulse and/or is breathing: Medical Treatment Goals LIMITED ADDITIONAL INTERVENTIONS: Use medication/IV fluids and cardiac monitoring as indicated; Do not use intubation or mechanical ventilation (DNI), also provide comfort medications.  Transfer to Progressive/Stepdown as indicated, avoid Intensive Care.   Consent: Discussion documented in EHR or advanced directives reviewed      03/31/23 1501           Code Status History     Date Active Date Inactive Code Status Order ID Comments User Context   03/25/2023 1214 03/31/2023 1501 Full Code [id number]  Lorretta Harp, MD ED   02/18/2020 1059 02/26/2020 2310 Full Code [id number]  Lorretta Harp, MD ED   12/25/2019 0555 12/28/2019 2204 Full Code [id number]  Mansy, Vernetta Honey, MD ED   10/12/2018 1647 10/15/2018 1808 Full Code [id number]  Schnier, Latina Craver, MD Inpatient   09/16/2018 1254 09/16/2018 2048 Full Code [id number]  Gilda Crease, Latina Craver, MD Inpatient   08/11/2018 1706 08/13/2018 2136 Full Code [id number]  Bertrum Sol, MD Inpatient   07/26/2018 0921 07/26/2018 1652 Full Code [id number]   Renford Dills, MD Inpatient   08/23/2017 0935 08/23/2017 1419 Full Code [id number]  Annice Needy, MD Inpatient   02/15/2017 1538 02/16/2017 1959 Full Code [id number]  Auburn Bilberry, MD Inpatient       Prognosis:  < 6 months   Thank you for allowing the Palliative Medicine Team to assist in the care of this patient.    Morton Stall, NP  Please contact Palliative Medicine Team phone at [phone] for questions and concerns.

## 2023-04-10 DIAGNOSIS — R570 Cardiogenic shock: Secondary | ICD-10-CM | POA: Diagnosis not present

## 2023-04-10 LAB — CBC
HCT: 26.8 % — ABNORMAL LOW (ref 36.0–46.0)
Hemoglobin: 8.5 g/dL — ABNORMAL LOW (ref 12.0–15.0)
MCH: 28.1 pg (ref 26.0–34.0)
MCHC: 31.7 g/dL (ref 30.0–36.0)
MCV: 88.7 fL (ref 80.0–100.0)
Platelets: 208 10*3/uL (ref 150–400)
RBC: 3.02 MIL/uL — ABNORMAL LOW (ref 3.87–5.11)
RDW: 14 % (ref 11.5–15.5)
WBC: 8.3 10*3/uL (ref 4.0–10.5)
nRBC: 0 % (ref 0.0–0.2)

## 2023-04-10 LAB — MAGNESIUM: Magnesium: 2.4 mg/dL (ref 1.7–2.4)

## 2023-04-10 LAB — BASIC METABOLIC PANEL
Anion gap: 10 (ref 5–15)
BUN: 80 mg/dL — ABNORMAL HIGH (ref 8–23)
CO2: 21 mmol/L — ABNORMAL LOW (ref 22–32)
Calcium: 8.5 mg/dL — ABNORMAL LOW (ref 8.9–10.3)
Chloride: 102 mmol/L (ref 98–111)
Creatinine, Ser: 3.8 mg/dL — ABNORMAL HIGH (ref 0.44–1.00)
GFR, Estimated: 11 mL/min — ABNORMAL LOW (ref >60)
Glucose, Bld: 86 mg/dL (ref 70–99)
Potassium: 4.8 mmol/L (ref 3.5–5.1)
Sodium: 133 mmol/L — ABNORMAL LOW (ref 135–145)

## 2023-04-10 LAB — GLUCOSE, CAPILLARY
Glucose-Capillary: 76 mg/dL (ref 70–99)
Glucose-Capillary: 117 mg/dL — ABNORMAL HIGH (ref 70–99)
Glucose-Capillary: 195 mg/dL — ABNORMAL HIGH (ref 70–99)
Glucose-Capillary: 170 mg/dL — ABNORMAL HIGH (ref 70–99)

## 2023-04-10 LAB — PHOSPHORUS: Phosphorus: 4.9 mg/dL — ABNORMAL HIGH (ref 2.5–4.6)

## 2023-04-10 NOTE — Progress Notes (Signed)
[facility]  PROGRESS NOTE   Subjective:   Patient seen at bedside.  Breathing better today.  Objective:  Vital signs: Blood pressure (!) 167/155, pulse (!) 58, temperature 97.7 F (36.5 C), resp. rate 14, height 5' (1.524 m), weight 58.5 kg, SpO2 96 %.  Intake/Output Summary (Last 24 hours) at 04/10/2023 1348 Last data filed at 04/10/2023 1047 Gross per 24 hour  Intake 480 ml  Output --  Net 480 ml   Filed Weights   04/06/23 0500 04/08/23 0841 04/09/23 0730  Weight: 60.1 kg 58.4 kg 58.5 kg     Physical Exam: General:  No acute distress  Head:  Normocephalic, atraumatic. Moist oral mucosal membranes  Eyes:  Anicteric  Neck:  Supple  Lungs:   Clear to auscultation, normal effort  Heart:  S1S2 no rubs  Abdomen:   Soft, nontender, bowel sounds present  Extremities:  peripheral edema.  Neurologic:  Awake, alert, following commands  Skin:  No lesions  Access:     Basic Metabolic Panel: Recent Labs  Lab 04/06/23 0536 04/07/23 0502 04/08/23 0924 04/09/23 1026 04/10/23 0529  NA 131* 133* 132* 131* 133*  K 3.3* 3.6 4.2 4.6 4.8  CL 98 100 99 100 102  CO2 21* 21* 20* 22 21*  GLUCOSE 108* 112* 135* 121* 86  BUN 70* 70* 69* 76* 80*  CREATININE 3.15* 3.14* 3.53* 3.80* 3.80*  CALCIUM 8.1* 7.8* 8.4* 8.3* 8.5*  MG  --   --   --  2.3 2.4  PHOS  --   --   --  4.9* 4.9*   GFR: Estimated Creatinine Clearance: 8.5 mL/min (A) (by C-G formula based on SCr of 3.8 mg/dL (H)).  Liver Function Tests: No results for input(s): "AST", "ALT", "ALKPHOS", "BILITOT", "PROT", "ALBUMIN" in the last 168 hours. No results for input(s): "LIPASE", "AMYLASE" in the last 168 hours. No results for input(s): "AMMONIA" in the last 168 hours.  CBC: Recent Labs  Lab 04/04/23 0544 04/06/23 0536 04/09/23 1026 04/10/23 0529  WBC 11.7* 14.2* 10.0 8.3  HGB 8.2* 8.9* 8.4* 8.5*  HCT 26.0* 28.2* 26.8* 26.8*  MCV 90.6 89.0 89.6 88.7  PLT 201 237 223 208     HbA1C: Hemoglobin A1C   Date/Time Value Ref Range Status  09/04/2022 12:00 AM 7.1  Final  10/31/2021 12:00 AM 7.4  Final   Hgb A1c MFr Bld  Date/Time Value Ref Range Status  03/25/2023 01:18 PM 7.9 (H) 4.8 - 5.6 % Final    Comment:    (NOTE)         Prediabetes: 5.7 - 6.4         Diabetes: >6.4         Glycemic control for adults with diabetes: <7.0   03/24/2022 10:53 AM 7.4 (H) 4.8 - 5.6 % Final    Comment:             Prediabetes: 5.7 - 6.4          Diabetes: >6.4          Glycemic control for adults with diabetes: <7.0     Urinalysis: No results for input(s): "COLORURINE", "LABSPEC", "PHURINE", "GLUCOSEU", "HGBUR", "BILIRUBINUR", "KETONESUR", "PROTEINUR", "UROBILINOGEN", "NITRITE", "LEUKOCYTESUR" in the last 72 hours.  Invalid input(s): "APPERANCEUR"    Imaging: No results found.   Medications:     amiodarone  200 mg Oral BID   apixaban  2.5 mg Oral BID   arformoterol  15 mcg Nebulization BID   aspirin EC  81 mg Oral Daily   carvedilol  3.125 mg Oral BID WC   Chlorhexidine Gluconate Cloth  6 each Topical Daily   cyanocobalamin  1,000 mcg Oral Daily   feeding supplement (NEPRO CARB STEADY)  237 mL Oral BID BM   gabapentin  100 mg Oral QHS   insulin aspart  0-15 Units Subcutaneous TID WC   insulin aspart  0-5 Units Subcutaneous QHS   insulin aspart  2 Units Subcutaneous TID WC   insulin glargine-yfgn  8 Units Subcutaneous QHS   isosorbide mononitrate  30 mg Oral Daily   multivitamin  1 tablet Oral BID   nystatin   Topical TID   polyethylene glycol  17 g Oral Daily   promethazine  6.25 mg Oral QHS   revefenacin  175 mcg Nebulization Daily   rosuvastatin  10 mg Oral Once per day on Mon Wed Fri Sat    Assessment/ Plan:     88 year old female with history of COPD, HTN, HLD, DM2, stroke, TIA, GERD, anxiety, PVD, CAD, MI, former tobacco use, CKD stage III, GI bleed, iron deficiency anemia, PAD status post PCI comes to the hospital with shortness of breath and hypoxia. Patient was found  to have bilateral pulmonary edema /effusion and acute kidney injury on chronic kidney disease.  #1: Acute kidney injury on chronic kidney disease: Patient with acute acute kidney injury on chronic kidney disease.  Renal indices are presently stable.  No acute need for dialysis.  Will continue to monitor closely.  #2: Hypertension: Blood pressure is better controlled.  Will continue the carvedilol and isosorbide.  #3: Diabetes: Continue insulin as ordered.  #4: Anemia: Anemia most likely secondary to chronic kidney disease.  Patient needs iron supplementation.  #5: Congestive heart failure: Patient should be on 1000 cc fluid restriction.  Off of Lasix at this time.  May need to resume on as needed basis.  Will follow closely.    LOS: 16 Lorain Childes, MD [facility] 6/15/20241:48 PM

## 2023-04-10 NOTE — Progress Notes (Signed)
[facility] Progress Note  Patient: Jill Hudson    ZOX:[id number]  DOA: 03/25/2023     Date of Service: the patient was seen and examined on 04/10/2023  Chief Complaint  Patient presents with   Shortness of Breath   Brief hospital course: 88 year old female with history of COPD, HTN, HLD, DM2, stroke, TIA, GERD, anxiety, PVD, CAD, MI, former tobacco use, CKD stage III, GI bleed, iron deficiency anemia, PAD status post PCI comes to the hospital with shortness of breath and hypoxia. Patient was found to have bilateral pulmonary edema /effusion with elevated BNP.    Assessment and Plan:  Cardiogenic shock [facility]) Cardiology took off milrinone drip 6/7.  Not on any pressors at this point.   Acute on chronic systolic CHF (congestive heart failure) (HCC) Acute respiratory failure with hypoxia (HCC) --Patient initially requiring BiPAP secondary to respiratory distress.  S/p supplemental O2 inhalation, gradually weaned off currently on room air. --cont hydralazine, Imdur and low-dose Coreg.  No ACE/ARB or spironolactone with her worsening kidney function. --6/12--holding lasix today per cardiology --6/13--remains off lasix today. Await cards to place dose for d/c purpose   Paroxysmal atrial fibrillation (HCC) Patient converted to normal sinus rhythm.  Taken off amiodarone drip and switched to oral on 6/7.  Heparin drip converted over to Eliquis on 6/7.   Acute kidney injury superimposed on CKD (HCC) --AKI on CKD stage IIIa.  Creatinine now 3.15.--3.14 (likley new baseline) --Nephrology will continue to talk with patient and family about next steps. -- Per nephrology patient is not the best candidate for dialysis. Creatinine 3.8 elevated, continue to monitor   NSTEMI (non-ST elevated myocardial infarction) (HCC) --With creatinine above 2, medical management.  -- Continue aspirin, Imdur Coreg and low-dose Crestor.   Type II diabetes mellitus with renal manifestations (HCC) --Last  hemoglobin A1c 7.9.  Hypoglycemic episode the other morning.  Decreased Semglee insulin down to 8 units at night.   --Continue sliding scale.   COPD exacerbation (HCC) --Continue nebulizers.   Essential hypertension --Continue hydralazine, Imdur and low-dose Coreg.   History of CVA (cerebrovascular accident) --Patient on Eliquis for stroke prevention   HLD (hyperlipidemia) --On Crestor     Iron deficiency anemia Last hemoglobin 8.4.  Continue to monitor.   Acute urinary retention: Patient required in and out catheter     Hyperkalemia: Previously treated. Resolved Hypokalemia due to lasix, Resolved  Hyponatremia --Sodium 133. Monitor electrolytes   Pleural effusion Right thoracentesis removed 700 mL of fluid.  This was done by interventional radiology on 6/3.   Body mass index is 25.19 kg/m.  Interventions:  Consults : palliative care, [facility] cardiology and nephrology   Diet: Heart healthy diet DVT Prophylaxis: Therapeutic Anticoagulation with Eliquis    Advance goals of care discussion: DNR  Family Communication: family was Not present at bedside, at the time of interview.  The pt provided permission to discuss medical plan with the family. Opportunity was given to ask question and all questions were answered satisfactorily.   Disposition:  Pt is from Home, admitted with CHF, developed renal failure, still has elevated creatinine, which precludes a safe discharge. Discharge to SNF, when stable and cleared by nephro and cardiology.  Subjective: No significant events overnight, patient denies any worsening of shortness of breath, no chest pain or palpitation, no any other active issues.  Physical Exam: General: NAD, lying comfortably Appear in no distress, affect appropriate Eyes: PERRLA ENT: Oral Mucosa Clear, moist  Neck: no JVD,  Cardiovascular: S1  and S2 Present, no Murmur,  Respiratory: good respiratory effort, Bilateral Air entry equal and Decreased, no  Crackles, no wheezes Abdomen: Bowel Sound present, Soft and no tenderness,  Skin: no rashes Extremities: mild Pedal edema, no calf tenderness Neurologic: without any new focal findings Gait not checked due to patient safety concerns  Vitals:   04/09/23 2350 04/10/23 0416 04/10/23 0759 04/10/23 0845  BP: 112/78 (!) 116/49  122/67  Pulse: (!) 52 (!) 49  (!) 51  Resp: 18 20  14   Temp: 98.1 F (36.7 C) 98.2 F (36.8 C)  98.1 F (36.7 C)  TempSrc:    Oral  SpO2: (!) 82% 94% 93% 99%  Weight:      Height:        Intake/Output Summary (Last 24 hours) at 04/10/2023 1234 Last data filed at 04/10/2023 1047 Gross per 24 hour  Intake 480 ml  Output --  Net 480 ml   Filed Weights   04/06/23 0500 04/08/23 0841 04/09/23 0730  Weight: 60.1 kg 58.4 kg 58.5 kg    Data Reviewed: I have personally reviewed and interpreted daily labs, tele strips, imagings as discussed above. I reviewed all nursing notes, pharmacy notes, vitals, pertinent old records I have discussed plan of care as described above with RN and patient/family.  CBC: Recent Labs  Lab 04/04/23 0544 04/06/23 0536 04/09/23 1026 04/10/23 0529  WBC 11.7* 14.2* 10.0 8.3  HGB 8.2* 8.9* 8.4* 8.5*  HCT 26.0* 28.2* 26.8* 26.8*  MCV 90.6 89.0 89.6 88.7  PLT 201 237 223 208   Basic Metabolic Panel: Recent Labs  Lab 04/06/23 0536 04/07/23 0502 04/08/23 0924 04/09/23 1026 04/10/23 0529  NA 131* 133* 132* 131* 133*  K 3.3* 3.6 4.2 4.6 4.8  CL 98 100 99 100 102  CO2 21* 21* 20* 22 21*  GLUCOSE 108* 112* 135* 121* 86  BUN 70* 70* 69* 76* 80*  CREATININE 3.15* 3.14* 3.53* 3.80* 3.80*  CALCIUM 8.1* 7.8* 8.4* 8.3* 8.5*  MG  --   --   --  2.3 2.4  PHOS  --   --   --  4.9* 4.9*    Studies: No results found.  Scheduled Meds:  amiodarone  200 mg Oral BID   apixaban  2.5 mg Oral BID   arformoterol  15 mcg Nebulization BID   aspirin EC  81 mg Oral Daily   carvedilol  3.125 mg Oral BID WC   Chlorhexidine Gluconate Cloth  6  each Topical Daily   cyanocobalamin  1,000 mcg Oral Daily   feeding supplement (NEPRO CARB STEADY)  237 mL Oral BID BM   gabapentin  100 mg Oral QHS   insulin aspart  0-15 Units Subcutaneous TID WC   insulin aspart  0-5 Units Subcutaneous QHS   insulin aspart  2 Units Subcutaneous TID WC   insulin glargine-yfgn  8 Units Subcutaneous QHS   isosorbide mononitrate  30 mg Oral Daily   multivitamin  1 tablet Oral BID   nystatin   Topical TID   polyethylene glycol  17 g Oral Daily   potassium chloride  10 mEq Oral BID   promethazine  6.25 mg Oral QHS   revefenacin  175 mcg Nebulization Daily   rosuvastatin  10 mg Oral Once per day on Mon Wed Fri Sat   Continuous Infusions: PRN Meds: acetaminophen, alum & mag hydroxide-simeth, dextromethorphan-guaiFENesin, hydrALAZINE, levalbuterol, melatonin, ondansetron (ZOFRAN) IV, mouth rinse, oxyCODONE, phenol, senna-docusate, traZODone  Time spent: 35 minutes  Author: Gillis Santa. MD [facility] 04/10/2023 12:34 PM  To reach On-call, see care teams to locate the attending and reach out to them via [email or web address]. If 7PM-7AM, please contact night-coverage If you still have difficulty reaching the attending provider, please page the [facility] (Director on Call) for [facility] on amion for assistance.

## 2023-04-10 NOTE — Progress Notes (Signed)
Physical Therapy Treatment Patient Details Name: Jill Hudson MRN: [id number] DOB: 04-May-1935 Today's Date: 04/10/2023   History of Present Illness Jill Hudson is a 88yoF who presented to ED secondary to progressive SOB; admitted for management of A/C CHF exacerbation, AECOPD.  s/p thoracentesis (6/3) with removal of fluid; pending cardiac cath and TEE.    PT Comments    Pt is slowly progressing with activity tolerance demonstrating decreased assist needed for bed mobility (min A), transfers (min A) and short ambulation with RW (2 ft to recliner and BSC) Min A.  Pt performed all activity on RA SPO2 95%.  Pt continues to be limited with fatigue and dypsnea requiring several minutes of  seated rest between activity.  PT will continue to follow to maximize independence and decrease caregiver burden.   Recommendations for follow up therapy are one component of a multi-disciplinary discharge planning process, led by the attending physician.  Recommendations may be updated based on patient status, additional functional criteria and insurance authorization.  Follow Up Recommendations  Can patient physically be transported by private vehicle: No    Assistance Recommended at Discharge Frequent or constant Supervision/Assistance  Patient can return home with the following A lot of help with walking and/or transfers;A little help with bathing/dressing/bathroom;Assist for transportation   Equipment Recommendations  Rolling walker (2 wheels)    Recommendations for Other Services       Precautions / Restrictions Precautions Precautions: Fall Restrictions Weight Bearing Restrictions: No     Mobility  Bed Mobility Overal bed mobility: Needs Assistance Bed Mobility: Supine to Sit     Supine to sit: Min assist     General bed mobility comments: pt used rail, required assist for anterior weight shift forward to scoot EOB.    Transfers Overall transfer level: Needs  assistance Equipment used: Rolling walker (2 wheels) Transfers: Sit to/from Stand, Bed to chair/wheelchair/BSC Sit to Stand: Min assist   Step pivot transfers: Min assist       General transfer comment: flexed posture, cues for safety when turning to sit with RW.    Ambulation/Gait Ambulation/Gait assistance: Min assist Gait Distance (Feet): 2 Feet (x2) Assistive device: Rolling walker (2 wheels) Gait Pattern/deviations: Step-to pattern Gait velocity: decreased     General Gait Details: Patient is fatigued with minimal activity and required frequent rest breaks with activity.  SPO2 >95% with activity.   Stairs             Wheelchair Mobility    Modified Rankin (Stroke Patients Only)       Balance Overall balance assessment: Needs assistance Sitting-balance support: Feet supported Sitting balance-Leahy Scale: Fair Sitting balance - Comments: poor initially progressing to fair Postural control: Posterior lean Standing balance support: Bilateral upper extremity supported Standing balance-Leahy Scale: Poor Standing balance comment: external support required                            Cognition Arousal/Alertness: Awake/alert Behavior During Therapy: WFL for tasks assessed/performed Overall Cognitive Status: Within Functional Limits for tasks assessed                                          Exercises Other Exercises Other Exercises: seated activity tolerance working on trunk control with head turns/nods.    General Comments        Pertinent Vitals/Pain  Pain Assessment Pain Assessment: No/denies pain    Home Living                          Prior Function            PT Goals (current goals can now be found in the care plan section) Acute Rehab PT Goals Patient Stated Goal: to go home PT Goal Formulation: With patient Time For Goal Achievement: 04/12/23 Potential to Achieve Goals: Good Progress towards PT  goals: Progressing toward goals    Frequency    Min 3X/week      PT Plan Current plan remains appropriate    Co-evaluation              AM-PAC PT "6 Clicks" Mobility   Outcome Measure  Help needed turning from your back to your side while in a flat bed without using bedrails?: A Lot Help needed moving from lying on your back to sitting on the side of a flat bed without using bedrails?: A Lot Help needed moving to and from a bed to a chair (including a wheelchair)?: A Lot Help needed standing up from a chair using your arms (e.g., wheelchair or bedside chair)?: A Little Help needed to walk in hospital room?: A Little Help needed climbing 3-5 steps with a railing? : A Lot 6 Click Score: 11    End of Session Equipment Utilized During Treatment: Gait belt Activity Tolerance: Patient limited by fatigue;Patient tolerated treatment well;No increased pain Patient left: in chair;with nursing/sitter in room;with call bell/phone within reach Nurse Communication: Mobility status PT Visit Diagnosis: Muscle weakness (generalized) (M62.81);Difficulty in walking, not elsewhere classified (R26.2)     Time: 1610-9604 PT Time Calculation (min) (ACUTE ONLY): 40 min  Hortencia Conradi, PTA  04/10/23, 12:36 PM

## 2023-04-10 NOTE — Plan of Care (Signed)
  Problem: Education: Goal: Ability to demonstrate management of disease process will improve Outcome: Progressing Goal: Ability to verbalize understanding of medication therapies will improve Outcome: Progressing Goal: Individualized Educational Video(s) Outcome: Progressing   Problem: Activity: Goal: Capacity to carry out activities will improve Outcome: Progressing   Problem: Cardiac: Goal: Ability to achieve and maintain adequate cardiopulmonary perfusion will improve Outcome: Progressing   Problem: Education: Goal: Knowledge of disease or condition will improve Outcome: Progressing Goal: Knowledge of the prescribed therapeutic regimen will improve Outcome: Progressing Goal: Individualized Educational Video(s) Outcome: Progressing   Problem: Activity: Goal: Ability to tolerate increased activity will improve Outcome: Progressing Goal: Will verbalize the importance of balancing activity with adequate rest periods Outcome: Progressing   Problem: Respiratory: Goal: Ability to maintain a clear airway will improve Outcome: Progressing Goal: Levels of oxygenation will improve Outcome: Progressing Goal: Ability to maintain adequate ventilation will improve Outcome: Progressing   Problem: Activity: Goal: Ability to tolerate increased activity will improve Outcome: Progressing   Problem: Clinical Measurements: Goal: Ability to maintain a body temperature in the normal range will improve Outcome: Progressing   Problem: Respiratory: Goal: Ability to maintain adequate ventilation will improve Outcome: Progressing Goal: Ability to maintain a clear airway will improve Outcome: Progressing   Problem: Education: Goal: Ability to describe self-care measures that may prevent or decrease complications (Diabetes Survival Skills Education) will improve Outcome: Progressing Goal: Individualized Educational Video(s) Outcome: Progressing   Problem: Coping: Goal: Ability to  adjust to condition or change in health will improve Outcome: Progressing   Problem: Fluid Volume: Goal: Ability to maintain a balanced intake and output will improve Outcome: Progressing   Problem: Health Behavior/Discharge Planning: Goal: Ability to identify and utilize available resources and services will improve Outcome: Progressing Goal: Ability to manage health-related needs will improve Outcome: Progressing   Problem: Metabolic: Goal: Ability to maintain appropriate glucose levels will improve Outcome: Progressing   Problem: Nutritional: Goal: Maintenance of adequate nutrition will improve Outcome: Progressing Goal: Progress toward achieving an optimal weight will improve Outcome: Progressing   Problem: Skin Integrity: Goal: Risk for impaired skin integrity will decrease Outcome: Progressing   Problem: Tissue Perfusion: Goal: Adequacy of tissue perfusion will improve Outcome: Progressing   Problem: Education: Goal: Knowledge of General Education information will improve Description: Including pain rating scale, medication(s)/side effects and non-pharmacologic comfort measures Outcome: Progressing   Problem: Health Behavior/Discharge Planning: Goal: Ability to manage health-related needs will improve Outcome: Progressing   Problem: Clinical Measurements: Goal: Ability to maintain clinical measurements within normal limits will improve Outcome: Progressing Goal: Will remain free from infection Outcome: Progressing Goal: Diagnostic test results will improve Outcome: Progressing Goal: Respiratory complications will improve Outcome: Progressing Goal: Cardiovascular complication will be avoided Outcome: Progressing   Problem: Activity: Goal: Risk for activity intolerance will decrease Outcome: Progressing   Problem: Nutrition: Goal: Adequate nutrition will be maintained Outcome: Progressing   Problem: Coping: Goal: Level of anxiety will  decrease Outcome: Progressing   Problem: Elimination: Goal: Will not experience complications related to bowel motility Outcome: Progressing Goal: Will not experience complications related to urinary retention Outcome: Progressing   Problem: Pain Managment: Goal: General experience of comfort will improve Outcome: Progressing   Problem: Safety: Goal: Ability to remain free from injury will improve Outcome: Progressing   Problem: Skin Integrity: Goal: Risk for impaired skin integrity will decrease Outcome: Progressing   

## 2023-04-11 DIAGNOSIS — R570 Cardiogenic shock: Secondary | ICD-10-CM | POA: Diagnosis not present

## 2023-04-11 LAB — CBC
HCT: 28.8 % — ABNORMAL LOW (ref 36.0–46.0)
Hemoglobin: 8.9 g/dL — ABNORMAL LOW (ref 12.0–15.0)
MCH: 27.6 pg (ref 26.0–34.0)
MCHC: 30.9 g/dL (ref 30.0–36.0)
MCV: 89.4 fL (ref 80.0–100.0)
Platelets: 243 10*3/uL (ref 150–400)
RBC: 3.22 MIL/uL — ABNORMAL LOW (ref 3.87–5.11)
RDW: 14 % (ref 11.5–15.5)
WBC: 9.7 10*3/uL (ref 4.0–10.5)
nRBC: 0 % (ref 0.0–0.2)

## 2023-04-11 LAB — BASIC METABOLIC PANEL
Anion gap: 11 (ref 5–15)
BUN: 78 mg/dL — ABNORMAL HIGH (ref 8–23)
CO2: 21 mmol/L — ABNORMAL LOW (ref 22–32)
Calcium: 8.5 mg/dL — ABNORMAL LOW (ref 8.9–10.3)
Chloride: 99 mmol/L (ref 98–111)
Creatinine, Ser: 3.78 mg/dL — ABNORMAL HIGH (ref 0.44–1.00)
GFR, Estimated: 11 mL/min — ABNORMAL LOW (ref >60)
Glucose, Bld: 123 mg/dL — ABNORMAL HIGH (ref 70–99)
Potassium: 4.8 mmol/L (ref 3.5–5.1)
Sodium: 131 mmol/L — ABNORMAL LOW (ref 135–145)

## 2023-04-11 LAB — MAGNESIUM: Magnesium: 2.4 mg/dL (ref 1.7–2.4)

## 2023-04-11 LAB — PHOSPHORUS: Phosphorus: 5 mg/dL — ABNORMAL HIGH (ref 2.5–4.6)

## 2023-04-11 LAB — GLUCOSE, CAPILLARY
Glucose-Capillary: 104 mg/dL — ABNORMAL HIGH (ref 70–99)
Glucose-Capillary: 149 mg/dL — ABNORMAL HIGH (ref 70–99)
Glucose-Capillary: 149 mg/dL — ABNORMAL HIGH (ref 70–99)
Glucose-Capillary: 167 mg/dL — ABNORMAL HIGH (ref 70–99)

## 2023-04-11 LAB — OSMOLALITY: Osmolality: 300 mOsm/kg — ABNORMAL HIGH (ref 275–295)

## 2023-04-11 MED ORDER — SODIUM CHLORIDE 0.9 % IV SOLN: INTRAVENOUS | Status: DC

## 2023-04-11 NOTE — TOC Progression Note (Signed)
Transition of Care [facility]) - Progression Note    Patient Details  Name: Jill Hudson MRN: [id number] Date of Birth: 01/24/1935  [facility]) CM/SW Contact  Bing Quarry, RN Phone Number: 04/11/2023, 1:29 PM  Clinical Narrative: 6/16: Insurance authorization started via [employer] Him, documents had to be faxed. 130 pm. Gabriel Cirri RN CM       Expected Discharge Plan: Skilled Nursing Facility Barriers to Discharge: Continued Medical Work up  Expected Discharge Plan and Services       Living arrangements for the past 2 months: Single Family Home                                       Social Determinants of Health (SDOH) Interventions SDOH Screenings   Food Insecurity: No Food Insecurity (03/25/2023)  Housing: Low Risk  (03/25/2023)  Transportation Needs: No Transportation Needs (03/25/2023)  Utilities: Not At Risk (03/25/2023)  Alcohol Screen: Low Risk  (03/11/2022)  Depression (PHQ2-9): High Risk (01/01/2023)  Financial Resource Strain: Low Risk  (01/01/2023)  Physical Activity: Insufficiently Active (03/11/2022)  Social Connections: Socially Integrated (05/12/2022)  Stress: No Stress Concern Present (03/11/2022)  Tobacco Use: Medium Risk (03/25/2023)    Readmission Risk Interventions     No data to display

## 2023-04-11 NOTE — Progress Notes (Addendum)
[facility] Progress Note  Patient: Jill Hudson    ZOX:[id number]  DOA: 03/25/2023     Date of Service: the patient was seen and examined on 04/11/2023  Chief Complaint  Patient presents with   Shortness of Breath   Brief hospital course: 88 year old female with history of COPD, HTN, HLD, DM2, stroke, TIA, GERD, anxiety, PVD, CAD, MI, former tobacco use, CKD stage III, GI bleed, iron deficiency anemia, PAD status post PCI comes to the hospital with shortness of breath and hypoxia. Patient was found to have bilateral pulmonary edema /effusion with elevated BNP.    Assessment and Plan:  Cardiogenic shock [facility]) Cardiology took off milrinone drip 6/7.  Not on any pressors at this point.   Acute on chronic systolic CHF (congestive heart failure) (HCC) Acute respiratory failure with hypoxia (HCC) --Patient initially requiring BiPAP secondary to respiratory distress.  S/p supplemental O2 inhalation, gradually weaned off currently on room air. --cont hydralazine, Imdur and low-dose Coreg.  No ACE/ARB or spironolactone with her worsening kidney function. --6/12--holding lasix  per cardiology --6/13--remains off lasix. Await cards to place dose for d/c purpose   Paroxysmal atrial fibrillation (HCC) Patient converted to normal sinus rhythm.  Taken off amiodarone drip and switched to oral on 6/7.  Heparin drip converted over to Eliquis on 6/7. Patient is on amiodarone 200 mg p.o. twice daily, continue for 1 month and then decrease dose as per cardiology.  Acute kidney injury superimposed on CKD (HCC) --AKI on CKD stage IIIa.  Creatinine now 3.15.--3.14 (likley new baseline) --Nephrology will continue to talk with patient and family about next steps. -- Per nephrology patient is not the best candidate for dialysis. Creatinine 3.78 elevated, continue to monitor   NSTEMI (non-ST elevated myocardial infarction) (HCC) --With creatinine above 2, medical management.  -- Continue aspirin,  Imdur, Coreg and low-dose Crestor.   Type II diabetes mellitus with renal manifestations (HCC) --Last hemoglobin A1c 7.9.  Hypoglycemic episode the other morning.  Decreased Semglee insulin down to 8 units at night.   --Continue sliding scale.   COPD exacerbation (HCC) --Continue nebulizers.   Essential hypertension --Continue hydralazine, Imdur and low-dose Coreg.   History of CVA (cerebrovascular accident) --Patient on Eliquis for stroke prevention   HLD (hyperlipidemia) --On Crestor     Iron deficiency anemia Last hemoglobin 8.4.  Continue to monitor.   Acute urinary retention: Patient required in and out catheter     Hyperkalemia: Previously treated. Resolved Hypokalemia due to lasix, Resolved  Hypertonic hyponatremia, serum osmolality 300 --Sodium 131 Monitor electrolytes   Pleural effusion Right thoracentesis removed 700 mL of fluid.  This was done by interventional radiology on 6/3.   Body mass index is 25.19 kg/m.  Interventions:  Consults : palliative care, [facility] cardiology and nephrology   Diet: Heart healthy diet DVT Prophylaxis: Therapeutic Anticoagulation with Eliquis    Advance goals of care discussion: DNR  Family Communication: family was Not present at bedside, at the time of interview.  The pt provided permission to discuss medical plan with the family. Opportunity was given to ask question and all questions were answered satisfactorily.   Disposition:  Pt is from Home, admitted with CHF, developed renal failure, still has elevated creatinine, which precludes a safe discharge. Discharge to SNF, when stable and cleared by nephro and cardiology.  Subjective: No significant events overnight, patient denies any worsening of shortness of breath, no chest pain or palpitation, no any other active issues.  Physical Exam: General: NAD, lying  comfortably Appear in no distress, affect appropriate Eyes: PERRLA ENT: Oral Mucosa Clear, moist  Neck: no JVD,   Cardiovascular: S1 and S2 Present, no Murmur,  Respiratory: good respiratory effort, Bilateral Air entry equal and Decreased, no Crackles, no wheezes Abdomen: Bowel Sound present, Soft and no tenderness,  Skin: no rashes Extremities: mild Pedal edema, no calf tenderness Neurologic: without any new focal findings Gait not checked due to patient safety concerns  Vitals:   04/11/23 0733 04/11/23 0807 04/11/23 1240 04/11/23 1520  BP:  120/67 (!) 121/52 (!) 134/57  Pulse:  (!) 55 (!) 49 (!) 57  Resp:  18 20 20   Temp:  98 F (36.7 C) 98.4 F (36.9 C) 98.2 F (36.8 C)  TempSrc:  Oral Oral Oral  SpO2: 96% 99% 97% 97%  Weight:      Height:        Intake/Output Summary (Last 24 hours) at 04/11/2023 1559 Last data filed at 04/11/2023 1345 Gross per 24 hour  Intake 480 ml  Output 100 ml  Net 380 ml   Filed Weights   04/06/23 0500 04/08/23 0841 04/09/23 0730  Weight: 60.1 kg 58.4 kg 58.5 kg    Data Reviewed: I have personally reviewed and interpreted daily labs, tele strips, imagings as discussed above. I reviewed all nursing notes, pharmacy notes, vitals, pertinent old records I have discussed plan of care as described above with RN and patient/family.  CBC: Recent Labs  Lab 04/06/23 0536 04/09/23 1026 04/10/23 0529 04/11/23 0424  WBC 14.2* 10.0 8.3 9.7  HGB 8.9* 8.4* 8.5* 8.9*  HCT 28.2* 26.8* 26.8* 28.8*  MCV 89.0 89.6 88.7 89.4  PLT 237 223 208 243   Basic Metabolic Panel: Recent Labs  Lab 04/07/23 0502 04/08/23 0924 04/09/23 1026 04/10/23 0529 04/11/23 0424  NA 133* 132* 131* 133* 131*  K 3.6 4.2 4.6 4.8 4.8  CL 100 99 100 102 99  CO2 21* 20* 22 21* 21*  GLUCOSE 112* 135* 121* 86 123*  BUN 70* 69* 76* 80* 78*  CREATININE 3.14* 3.53* 3.80* 3.80* 3.78*  CALCIUM 7.8* 8.4* 8.3* 8.5* 8.5*  MG  --   --  2.3 2.4 2.4  PHOS  --   --  4.9* 4.9* 5.0*    Studies: No results found.  Scheduled Meds:  amiodarone  200 mg Oral BID   apixaban  2.5 mg Oral BID    arformoterol  15 mcg Nebulization BID   aspirin EC  81 mg Oral Daily   carvedilol  3.125 mg Oral BID WC   Chlorhexidine Gluconate Cloth  6 each Topical Daily   cyanocobalamin  1,000 mcg Oral Daily   feeding supplement (NEPRO CARB STEADY)  237 mL Oral BID BM   gabapentin  100 mg Oral QHS   insulin aspart  0-15 Units Subcutaneous TID WC   insulin aspart  0-5 Units Subcutaneous QHS   insulin aspart  2 Units Subcutaneous TID WC   insulin glargine-yfgn  8 Units Subcutaneous QHS   isosorbide mononitrate  30 mg Oral Daily   multivitamin  1 tablet Oral BID   nystatin   Topical TID   polyethylene glycol  17 g Oral Daily   promethazine  6.25 mg Oral QHS   revefenacin  175 mcg Nebulization Daily   rosuvastatin  10 mg Oral Once per day on Mon Wed Fri Sat   Continuous Infusions: PRN Meds: acetaminophen, alum & mag hydroxide-simeth, dextromethorphan-guaiFENesin, hydrALAZINE, levalbuterol, melatonin, ondansetron (ZOFRAN) IV, mouth rinse, oxyCODONE,  phenol, senna-docusate, traZODone  Time spent: 35 minutes  Author: Gillis Santa. MD [facility] 04/11/2023 3:59 PM  To reach On-call, see care teams to locate the attending and reach out to them via [email or web address]. If 7PM-7AM, please contact night-coverage If you still have difficulty reaching the attending provider, please page the [facility] (Director on Call) for [facility] on amion for assistance.

## 2023-04-11 NOTE — Progress Notes (Signed)
[facility]  PROGRESS NOTE   Subjective:   Patient seen at bedside.  Appetite has been poor.  Objective:  Vital signs: Blood pressure (!) 134/57, pulse (!) 57, temperature 98.2 F (36.8 C), temperature source Oral, resp. rate 20, height 5' (1.524 m), weight 58.5 kg, SpO2 97 %.  Intake/Output Summary (Last 24 hours) at 04/11/2023 1920 Last data filed at 04/11/2023 1345 Gross per 24 hour  Intake 480 ml  Output 100 ml  Net 380 ml   Filed Weights   04/06/23 0500 04/08/23 0841 04/09/23 0730  Weight: 60.1 kg 58.4 kg 58.5 kg     Physical Exam: General:  No acute distress  Head:  Normocephalic, atraumatic. Moist oral mucosal membranes  Eyes:  Anicteric  Neck:  Supple  Lungs:   Clear to auscultation, normal effort  Heart:  S1S2 no rubs  Abdomen:   Soft, nontender, bowel sounds present  Extremities:  peripheral edema.  Neurologic:  Awake, alert, following commands  Skin:  No lesions  Access:     Basic Metabolic Panel: Recent Labs  Lab 04/07/23 0502 04/08/23 0924 04/09/23 1026 04/10/23 0529 04/11/23 0424  NA 133* 132* 131* 133* 131*  K 3.6 4.2 4.6 4.8 4.8  CL 100 99 100 102 99  CO2 21* 20* 22 21* 21*  GLUCOSE 112* 135* 121* 86 123*  BUN 70* 69* 76* 80* 78*  CREATININE 3.14* 3.53* 3.80* 3.80* 3.78*  CALCIUM 7.8* 8.4* 8.3* 8.5* 8.5*  MG  --   --  2.3 2.4 2.4  PHOS  --   --  4.9* 4.9* 5.0*   GFR: Estimated Creatinine Clearance: 8.6 mL/min (A) (by C-G formula based on SCr of 3.78 mg/dL (H)).  Liver Function Tests: No results for input(s): "AST", "ALT", "ALKPHOS", "BILITOT", "PROT", "ALBUMIN" in the last 168 hours. No results for input(s): "LIPASE", "AMYLASE" in the last 168 hours. No results for input(s): "AMMONIA" in the last 168 hours.  CBC: Recent Labs  Lab 04/06/23 0536 04/09/23 1026 04/10/23 0529 04/11/23 0424  WBC 14.2* 10.0 8.3 9.7  HGB 8.9* 8.4* 8.5* 8.9*  HCT 28.2* 26.8* 26.8* 28.8*  MCV 89.0 89.6 88.7 89.4  PLT 237 223 208 243      HbA1C: Hemoglobin A1C  Date/Time Value Ref Range Status  09/04/2022 12:00 AM 7.1  Final  10/31/2021 12:00 AM 7.4  Final   Hgb A1c MFr Bld  Date/Time Value Ref Range Status  03/25/2023 01:18 PM 7.9 (H) 4.8 - 5.6 % Final    Comment:    (NOTE)         Prediabetes: 5.7 - 6.4         Diabetes: >6.4         Glycemic control for adults with diabetes: <7.0   03/24/2022 10:53 AM 7.4 (H) 4.8 - 5.6 % Final    Comment:             Prediabetes: 5.7 - 6.4          Diabetes: >6.4          Glycemic control for adults with diabetes: <7.0     Urinalysis: No results for input(s): "COLORURINE", "LABSPEC", "PHURINE", "GLUCOSEU", "HGBUR", "BILIRUBINUR", "KETONESUR", "PROTEINUR", "UROBILINOGEN", "NITRITE", "LEUKOCYTESUR" in the last 72 hours.  Invalid input(s): "APPERANCEUR"    Imaging: No results found.   Medications:     amiodarone  200 mg Oral BID   apixaban  2.5 mg Oral BID   arformoterol  15 mcg Nebulization BID   aspirin  EC  81 mg Oral Daily   carvedilol  3.125 mg Oral BID WC   Chlorhexidine Gluconate Cloth  6 each Topical Daily   cyanocobalamin  1,000 mcg Oral Daily   feeding supplement (NEPRO CARB STEADY)  237 mL Oral BID BM   gabapentin  100 mg Oral QHS   insulin aspart  0-15 Units Subcutaneous TID WC   insulin aspart  0-5 Units Subcutaneous QHS   insulin aspart  2 Units Subcutaneous TID WC   insulin glargine-yfgn  8 Units Subcutaneous QHS   isosorbide mononitrate  30 mg Oral Daily   multivitamin  1 tablet Oral BID   nystatin   Topical TID   polyethylene glycol  17 g Oral Daily   promethazine  6.25 mg Oral QHS   revefenacin  175 mcg Nebulization Daily   rosuvastatin  10 mg Oral Once per day on Mon Wed Fri Sat    Assessment/ Plan:     88 year old female with history of COPD, HTN, HLD, DM2, stroke, TIA, GERD, anxiety, PVD, CAD, MI, former tobacco use, CKD stage III, GI bleed, iron deficiency anemia, PAD status post PCI comes to the hospital with shortness of breath  and hypoxia. Patient was found to have bilateral pulmonary edema /effusion and acute kidney injury on chronic kidney disease.   #1: Acute kidney injury on chronic kidney disease: Patient with acute acute kidney injury on chronic kidney disease.  Renal indices are presently stable.  No acute need for dialysis.  Will continue to monitor closely.   #2: Hypertension: Blood pressure is better controlled.  Will continue the carvedilol and isosorbide.   #3: Diabetes: Continue insulin as ordered.   #4: Anemia: Anemia most likely secondary to chronic kidney disease.  Patient needs iron supplementation.   #5: Congestive heart failure: Patient should be on 1000 cc fluid restriction.  Off of Lasix at this time.  May need to resume on as needed basis.   Will follow closely.     LOS: 17 Lorain Childes, MD [facility] 6/16/20247:20 PM

## 2023-04-11 NOTE — Progress Notes (Signed)
Progress Note  Patient Name: Jill Hudson Date of Encounter: 04/11/2023  Primary Cardiologist: None   Subjective   No chest pain or sob.  Inpatient Medications    Scheduled Meds:  amiodarone  200 mg Oral BID   apixaban  2.5 mg Oral BID   arformoterol  15 mcg Nebulization BID   aspirin EC  81 mg Oral Daily   carvedilol  3.125 mg Oral BID WC   Chlorhexidine Gluconate Cloth  6 each Topical Daily   cyanocobalamin  1,000 mcg Oral Daily   feeding supplement (NEPRO CARB STEADY)  237 mL Oral BID BM   gabapentin  100 mg Oral QHS   insulin aspart  0-15 Units Subcutaneous TID WC   insulin aspart  0-5 Units Subcutaneous QHS   insulin aspart  2 Units Subcutaneous TID WC   insulin glargine-yfgn  8 Units Subcutaneous QHS   isosorbide mononitrate  30 mg Oral Daily   multivitamin  1 tablet Oral BID   nystatin   Topical TID   polyethylene glycol  17 g Oral Daily   promethazine  6.25 mg Oral QHS   revefenacin  175 mcg Nebulization Daily   rosuvastatin  10 mg Oral Once per day on Mon Wed Fri Sat   Continuous Infusions:  PRN Meds: acetaminophen, alum & mag hydroxide-simeth, dextromethorphan-guaiFENesin, hydrALAZINE, levalbuterol, melatonin, ondansetron (ZOFRAN) IV, mouth rinse, oxyCODONE, phenol, senna-docusate, traZODone   Vital Signs    Vitals:   04/11/23 0049 04/11/23 0315 04/11/23 0733 04/11/23 0807  BP: (!) 123/93 122/65  120/67  Pulse: 73 (!) 54  (!) 55  Resp: 20 16  18   Temp: 97.7 F (36.5 C) 97.9 F (36.6 C)  98 F (36.7 C)  TempSrc: Oral   Oral  SpO2: 93% 94% 96% 99%  Weight:      Height:        Intake/Output Summary (Last 24 hours) at 04/11/2023 1128 Last data filed at 04/11/2023 1000 Gross per 24 hour  Intake 240 ml  Output 100 ml  Net 140 ml   Filed Weights   04/06/23 0500 04/08/23 0841 04/09/23 0730  Weight: 60.1 kg 58.4 kg 58.5 kg    Telemetry    NSR - Personally Reviewed  ECG    none - Personally Reviewed  Physical Exam   GEN: No acute  distress.   Neck: 7 cm JVD Cardiac: RRR, no murmurs, rubs, or gallops.  Respiratory: Clear to auscultation bilaterally. GI: Soft, nontender, non-distended  MS: No edema; No deformity. Neuro:  Nonfocal  Psych: Normal affect   Labs    Chemistry Recent Labs  Lab 04/09/23 1026 04/10/23 0529 04/11/23 0424  NA 131* 133* 131*  K 4.6 4.8 4.8  CL 100 102 99  CO2 22 21* 21*  GLUCOSE 121* 86 123*  BUN 76* 80* 78*  CREATININE 3.80* 3.80* 3.78*  CALCIUM 8.3* 8.5* 8.5*  GFRNONAA 11* 11* 11*  ANIONGAP 9 10 11      Hematology Recent Labs  Lab 04/09/23 1026 04/10/23 0529 04/11/23 0424  WBC 10.0 8.3 9.7  RBC 2.99* 3.02* 3.22*  HGB 8.4* 8.5* 8.9*  HCT 26.8* 26.8* 28.8*  MCV 89.6 88.7 89.4  MCH 28.1 28.1 27.6  MCHC 31.3 31.7 30.9  RDW 14.0 14.0 14.0  PLT 223 208 243    Cardiac EnzymesNo results for input(s): "TROPONINI" in the last 168 hours. No results for input(s): "TROPIPOC" in the last 168 hours.   BNPNo results for input(s): "BNP", "PROBNP" in the  last 168 hours.   DDimer No results for input(s): "DDIMER" in the last 168 hours.   Radiology    No results found.  Cardiac Studies   See above  Patient Profile     88 y.o. female admitted with acute systolic heart failure  Assessment & Plan    Acute systolic heart failure - she is much improved. Echo ef 30%. Treatment limited by renal stage 4. Continue low dose coreg. Continue isosorbide. PAF - continue amiodarone 400 mg daily. She will need followup in 3-4 weeks to reduce her dose down. Disp - she could be discharged today or tomorrow. Followup with Dr. Ricardo Jericho EP PA.     For questions or updates, please contact [facility] Please consult [email or web address] for contact info under Cardiology/STEMI.      Signed, Lewayne Bunting, MD  04/11/2023, 11:28 AM

## 2023-04-12 ENCOUNTER — Inpatient Hospital Stay: Payer: Medicare HMO

## 2023-04-12 DIAGNOSIS — R570 Cardiogenic shock: Secondary | ICD-10-CM | POA: Diagnosis not present

## 2023-04-12 LAB — CBC
HCT: 26.1 % — ABNORMAL LOW (ref 36.0–46.0)
Hemoglobin: 8.1 g/dL — ABNORMAL LOW (ref 12.0–15.0)
MCH: 27.6 pg (ref 26.0–34.0)
MCHC: 31 g/dL (ref 30.0–36.0)
MCV: 88.8 fL (ref 80.0–100.0)
Platelets: 207 10*3/uL (ref 150–400)
RBC: 2.94 MIL/uL — ABNORMAL LOW (ref 3.87–5.11)
RDW: 14 % (ref 11.5–15.5)
WBC: 7.8 10*3/uL (ref 4.0–10.5)
nRBC: 0 % (ref 0.0–0.2)

## 2023-04-12 LAB — BASIC METABOLIC PANEL
Anion gap: 11 (ref 5–15)
BUN: 78 mg/dL — ABNORMAL HIGH (ref 8–23)
CO2: 19 mmol/L — ABNORMAL LOW (ref 22–32)
Calcium: 8 mg/dL — ABNORMAL LOW (ref 8.9–10.3)
Chloride: 101 mmol/L (ref 98–111)
Creatinine, Ser: 3.64 mg/dL — ABNORMAL HIGH (ref 0.44–1.00)
GFR, Estimated: 12 mL/min — ABNORMAL LOW (ref >60)
Glucose, Bld: 107 mg/dL — ABNORMAL HIGH (ref 70–99)
Potassium: 4.9 mmol/L (ref 3.5–5.1)
Sodium: 131 mmol/L — ABNORMAL LOW (ref 135–145)

## 2023-04-12 LAB — GLUCOSE, CAPILLARY
Glucose-Capillary: 84 mg/dL (ref 70–99)
Glucose-Capillary: 108 mg/dL — ABNORMAL HIGH (ref 70–99)
Glucose-Capillary: 148 mg/dL — ABNORMAL HIGH (ref 70–99)
Glucose-Capillary: 186 mg/dL — ABNORMAL HIGH (ref 70–99)

## 2023-04-12 LAB — MAGNESIUM: Magnesium: 2.3 mg/dL (ref 1.7–2.4)

## 2023-04-12 LAB — PHOSPHORUS: Phosphorus: 4.9 mg/dL — ABNORMAL HIGH (ref 2.5–4.6)

## 2023-04-12 MED ORDER — SODIUM BICARBONATE 650 MG PO TABS
650.0000 mg | ORAL_TABLET | Freq: Two times a day (BID) | ORAL | Status: DC
  Administered 2023-04-12 – 2023-04-20 (×17): 650 mg via ORAL
  Filled 2023-04-12 (×17): qty 1

## 2023-04-12 NOTE — Progress Notes (Signed)
Physical Therapy Treatment Patient Details Name: Jill Hudson MRN: [id number] DOB: Dec 12, 1934 Today's Date: 04/12/2023   History of Present Illness Jill Hudson is a 88yoF who presented to ED secondary to progressive SOB; admitted for management of A/C CHF exacerbation, AECOPD.  s/p thoracentesis (6/3) with removal of fluid; pending cardiac cath and TEE.    PT Comments    Patient is agreeable to PT. She continues to have generalized weakness and requires assistance with bed mobility and transfers. Standing tolerance is limited for progression of taking more than a few steps before needing to sit due to fatigue. Continue to recommend PT to maximize independence and facilitate return to prior level of function.    Recommendations for follow up therapy are one component of a multi-disciplinary discharge planning process, led by the attending physician.  Recommendations may be updated based on patient status, additional functional criteria and insurance authorization.  Follow Up Recommendations  Can patient physically be transported by private vehicle: No    Assistance Recommended at Discharge Frequent or constant Supervision/Assistance  Patient can return home with the following A lot of help with walking and/or transfers;A little help with bathing/dressing/bathroom;Assist for transportation   Equipment Recommendations  Rolling walker (2 wheels)    Recommendations for Other Services       Precautions / Restrictions Precautions Precautions: Fall Restrictions Weight Bearing Restrictions: No     Mobility  Bed Mobility Overal bed mobility: Needs Assistance Bed Mobility: Supine to Sit, Sit to Supine     Supine to sit: Mod assist Sit to supine: Mod assist   General bed mobility comments: assistance for trunk support to sit upright and assistance for BLE support to return to bed. increased time and effort required with all mobility with intermittent rest breaks needed.  sequencing cues provided    Transfers Overall transfer level: Needs assistance Equipment used: Rolling walker (2 wheels) Transfers: Sit to/from Stand Sit to Stand: Mod assist           General transfer comment: lifting and lowering assistance provided. verbal cues for hand placement and anterior weight shifting    Ambulation/Gait Ambulation/Gait assistance: Min assist Gait Distance (Feet): 2 Feet Assistive device: Rolling walker (2 wheels)   Gait velocity: decreased     General Gait Details: side steps performed to the right. standing activity tolerance limited for further progression of activity   Stairs             Wheelchair Mobility    Modified Rankin (Stroke Patients Only)       Balance Overall balance assessment: Needs assistance Sitting-balance support: Feet supported Sitting balance-Leahy Scale: Fair     Standing balance support: Bilateral upper extremity supported Standing balance-Leahy Scale: Poor Standing balance comment: external support required                            Cognition Arousal/Alertness: Awake/alert Behavior During Therapy: WFL for tasks assessed/performed Overall Cognitive Status: Within Functional Limits for tasks assessed                                 General Comments: increased time required to follow commands        Exercises      General Comments General comments (skin integrity, edema, etc.): dyspnea with exertion with cues for energy conservation and breathing techniques. heart rate in the 60's with sitting upright. extended  care plan with goals still appropriate      Pertinent Vitals/Pain Pain Assessment Pain Assessment: No/denies pain    Home Living                          Prior Function            PT Goals (current goals can now be found in the care plan section) Acute Rehab PT Goals Patient Stated Goal: to go home PT Goal Formulation: With patient Time For  Goal Achievement: 04/26/23 Potential to Achieve Goals: Good Progress towards PT goals: Progressing toward goals    Frequency    Min 3X/week      PT Plan Current plan remains appropriate    Co-evaluation              AM-PAC PT "6 Clicks" Mobility   Outcome Measure  Help needed turning from your back to your side while in a flat bed without using bedrails?: A Lot Help needed moving from lying on your back to sitting on the side of a flat bed without using bedrails?: A Lot Help needed moving to and from a bed to a chair (including a wheelchair)?: A Lot Help needed standing up from a chair using your arms (e.g., wheelchair or bedside chair)?: A Little Help needed to walk in hospital room?: A Little Help needed climbing 3-5 steps with a railing? : A Lot 6 Click Score: 14    End of Session   Activity Tolerance: Patient tolerated treatment well;Patient limited by fatigue Patient left: in bed;with call bell/phone within reach;with bed alarm set;with nursing/sitter in room (nurse and nurse student in the room) Nurse Communication: Mobility status PT Visit Diagnosis: Muscle weakness (generalized) (M62.81);Difficulty in walking, not elsewhere classified (R26.2)     Time: 6962-9528 PT Time Calculation (min) (ACUTE ONLY): 21 min  Charges:  $Therapeutic Activity: 8-22 mins                     [name], PT, MPT    Ina Homes 04/12/2023, 10:38 AM

## 2023-04-12 NOTE — Plan of Care (Signed)
  Problem: Education: Goal: Ability to demonstrate management of disease process will improve 04/12/2023 0246 by Charmian Muff, RN Outcome: Progressing 04/12/2023 0132 by Charmian Muff, RN Outcome: Progressing Goal: Ability to verbalize understanding of medication therapies will improve Outcome: Progressing Goal: Individualized Educational Video(s) Outcome: Progressing   Problem: Cardiac: Goal: Ability to achieve and maintain adequate cardiopulmonary perfusion will improve Outcome: Progressing

## 2023-04-12 NOTE — Progress Notes (Signed)
Occupational Therapy Treatment Patient Details Name: Jill Hudson MRN: [id number] DOB: 31-Jan-1935 Today's Date: 04/12/2023   History of present illness Jill Hudson is a 88yoF who presented to ED secondary to progressive SOB; admitted for management of A/C CHF exacerbation, AECOPD.  s/p thoracentesis (6/3) with removal of fluid; pending cardiac cath and TEE.   OT comments  Jill Hudson was seen for OT treatment on this date. Pt due for re-evaluation of functional goals, however treatment plan remains appropriate. Upon arrival to room pt semi-supine in bed, endorsing fatigue, but agreeable to OT Tx session. OT facilitated ADL management as described below. See ADL section for additional details regarding occupational performance. Pt continues to be functionally limited by generalized weakness, increased dyspnea with exertion, and decreased activity tolerance. Educated on energy conservation and safety t/o session. Pt return verbalizes understanding of education provided t/o session. Pt is progressing toward OT goals and continues to benefit from skilled OT services to maximize return to PLOF and minimize risk of future falls, injury, caregiver burden, and readmission. Will continue to follow POC as written. Discharge recommendation remains appropriate.     Recommendations for follow up therapy are one component of a multi-disciplinary discharge planning process, led by the attending physician.  Recommendations may be updated based on patient status, additional functional criteria and insurance authorization.    Assistance Recommended at Discharge Intermittent Supervision/Assistance  Patient can return home with the following  A little help with walking and/or transfers;A little help with bathing/dressing/bathroom;Assistance with cooking/housework;Assist for transportation;Help with stairs or ramp for entrance   Equipment Recommendations  BSC/3in1    Recommendations for Other Services       Precautions / Restrictions Precautions Precautions: Fall Restrictions Weight Bearing Restrictions: No       Mobility Bed Mobility Overal bed mobility: Needs Assistance Bed Mobility: Supine to Sit     Supine to sit: Min assist, HOB elevated          Transfers Overall transfer level: Needs assistance Equipment used: Rolling walker (2 wheels) Transfers: Sit to/from Stand Sit to Stand: Min assist     Step pivot transfers: Min guard           Balance Overall balance assessment: Needs assistance Sitting-balance support: Feet supported Sitting balance-Leahy Scale: Good Sitting balance - Comments: steady sitting, reaching inside BOS. Able to maintain static sitting balance during ~20 min of ADL management during session.   Standing balance support: Reliant on assistive device for balance, During functional activity Standing balance-Leahy Scale: Fair Standing balance comment: Able to perform functional mobility with heavy UE support on RW but no physical assist.                           ADL either performed or assessed with clinical judgement   ADL Overall ADL's : Needs assistance/impaired     Grooming: Sitting;Wash/dry face;Oral care           Upper Body Dressing : Minimal assistance;Sitting                   Functional mobility during ADLs: Rolling walker (2 wheels);Min guard;Cueing for safety General ADL Comments: educated on energy conservation strategies t/o ADL management. Pt is able to return demo understanding of education with min cueing.    Extremity/Trunk Assessment              Vision Patient Visual Report: No change from baseline     Perception  Praxis      Cognition Arousal/Alertness: Awake/alert Behavior During Therapy: WFL for tasks assessed/performed Overall Cognitive Status: Within Functional Limits for tasks assessed                                 General Comments: increased time required  to follow commands        Exercises Other Exercises Other Exercises: Ongoing education on safety, falls prevention, and energy conservation strategies t/o ADL management as described above.    Shoulder Instructions       General Comments Increased SOB with functional activity. Benefits from frequent therapeutic rest breaks and PLB t/o session. Educated on ECS t/o session. HR remains 55-60 with spO2 WFL >/=93% t/o session.    Pertinent Vitals/ Pain       Pain Assessment Pain Assessment: No/denies pain (fatigue but no pain today)  Home Living                                          Prior Functioning/Environment              Frequency  Min 2X/week        Progress Toward Goals  OT Goals(current goals can now be found in the care plan section)  Progress towards OT goals: Progressing toward goals  Acute Rehab OT Goals Patient Stated Goal: To catch my breath and be able to take care of myself. OT Goal Formulation: With patient Time For Goal Achievement: 04/12/23 Potential to Achieve Goals: Good ADL Goals Pt Will Perform Grooming: with modified independence;sitting  Plan Frequency remains appropriate;Discharge plan remains appropriate    Co-evaluation                 AM-PAC OT "6 Clicks" Daily Activity     Outcome Measure   Help from another person eating meals?: None Help from another person taking care of personal grooming?: A Little Help from another person toileting, which includes using toliet, bedpan, or urinal?: A Little Help from another person bathing (including washing, rinsing, drying)?: A Lot Help from another person to put on and taking off regular upper body clothing?: A Little Help from another person to put on and taking off regular lower body clothing?: A Lot 6 Click Score: 17    End of Session Equipment Utilized During Treatment: Rolling walker (2 wheels);Gait belt  OT Visit Diagnosis: Unsteadiness on feet  (R26.81);Muscle weakness (generalized) (M62.81);History of falling (Z91.81)   Activity Tolerance Patient tolerated treatment well   Patient Left in bed;with call bell/phone within reach;with bed alarm set   Nurse Communication Mobility status        Time: 1610-9604 OT Time Calculation (min): 31 min  Charges: OT General Charges $OT Visit: 1 Visit OT Treatments $Self Care/Home Management : 23-37 mins  Rockney Ghee, M.S., OTR/L 04/12/23, 1:03 PM

## 2023-04-12 NOTE — Care Management Important Message (Signed)
Important Message  Patient Details  Name: Jill Hudson MRN: [id number] Date of Birth: November 22, 1934   Medicare Important Message Given:  Yes     [name] 04/12/2023, 1:41 PM

## 2023-04-12 NOTE — Progress Notes (Signed)
Mobility Specialist - Progress Note  Post-mobility: SPO2(96)    04/12/23 1522  Mobility  Activity Turned to left side;Turned to right side;Turned to back - supine;Stood at bedside;Ambulated with assistance in room  Level of Assistance Minimal assist, patient does 75% or more  Assistive Device Front wheel walker  Distance Ambulated (ft) 4 ft  Range of Motion/Exercises Active  Activity Response Tolerated well  Mobility Referral Yes  $Mobility charge 1 Mobility  Mobility Specialist Start Time (ACUTE ONLY) 1458  Mobility Specialist Stop Time (ACUTE ONLY) 1525  Mobility Specialist Time Calculation (min) (ACUTE ONLY) 27 min   Pt resting in bed on RA upon entry. Pt STS x2 and ambulates in room MinA with RW. Pt returned to bed and left with needs in reach and bed alarm activated. Family present at bedside.   Johnathan Hausen Mobility Specialist 04/12/23, 3:27 PM

## 2023-04-12 NOTE — Progress Notes (Signed)
[facility]  ROUNDING NOTE   Subjective:   [name] was admitted to [facility] on 03/25/2023 for COPD exacerbation [facility]) [J44.1] Chest pain [R07.9] Community acquired pneumonia, unspecified laterality [J18.9]  Sitting up in bed No family at bedside Appetite remains poor States she is drinking Ensure  Creatinine 3.6  Objective:  Vital signs in last 24 hours:  Temp:  [97.5 F (36.4 C)-98.6 F (37 C)] 97.5 F (36.4 C) (06/17 1208) Pulse Rate:  [49-57] 53 (06/17 1208) Resp:  [16-21] 16 (06/17 1208) BP: (124-134)/(42-108) 124/108 (06/17 1208) SpO2:  [93 %-100 %] 96 % (06/17 1208)  Weight change:  Filed Weights   04/06/23 0500 04/08/23 0841 04/09/23 0730  Weight: 60.1 kg 58.4 kg 58.5 kg    Intake/Output: I/O last 3 completed shifts: In: 480 [P.O.:480] Out: 100 [Urine:100]   Intake/Output this shift:  Total I/O In: 360 [P.O.:360] Out: -   Physical Exam: General: NAD, laying in bed  Head: Normocephalic, atraumatic. Moist oral mucosal membranes  Eyes: Anicteric  Lungs:  Diminished in bases, room air  Heart: regular  Abdomen:  Soft, nontender  Extremities:  trace peripheral edema.  Neurologic: Alert and oriented to self, moving all four extremities  Skin: warm        Basic Metabolic Panel: Recent Labs  Lab 04/08/23 0924 04/09/23 1026 04/10/23 0529 04/11/23 0424 04/12/23 0348  NA 132* 131* 133* 131* 131*  K 4.2 4.6 4.8 4.8 4.9  CL 99 100 102 99 101  CO2 20* 22 21* 21* 19*  GLUCOSE 135* 121* 86 123* 107*  BUN 69* 76* 80* 78* 78*  CREATININE 3.53* 3.80* 3.80* 3.78* 3.64*  CALCIUM 8.4* 8.3* 8.5* 8.5* 8.0*  MG  --  2.3 2.4 2.4 2.3  PHOS  --  4.9* 4.9* 5.0* 4.9*     Liver Function Tests: No results for input(s): "AST", "ALT", "ALKPHOS", "BILITOT", "PROT", "ALBUMIN" in the last 168 hours.  No results for input(s): "LIPASE", "AMYLASE" in the last 168 hours. No results for input(s): "AMMONIA" in the last 168 hours.  CBC: Recent Labs   Lab 04/06/23 0536 04/09/23 1026 04/10/23 0529 04/11/23 0424 04/12/23 0348  WBC 14.2* 10.0 8.3 9.7 7.8  HGB 8.9* 8.4* 8.5* 8.9* 8.1*  HCT 28.2* 26.8* 26.8* 28.8* 26.1*  MCV 89.0 89.6 88.7 89.4 88.8  PLT 237 223 208 243 207     Cardiac Enzymes: No results for input(s): "CKTOTAL", "CKMB", "CKMBINDEX", "TROPONINI" in the last 168 hours.  BNP: Invalid input(s): "POCBNP"  CBG: Recent Labs  Lab 04/11/23 1242 04/11/23 1555 04/11/23 2039 04/12/23 0801 04/12/23 1210  GLUCAP 149* 149* 167* 84 108*     Microbiology: Results for orders placed or performed during the hospital encounter of 03/25/23  Blood Culture (routine x 2)     Status: None   Collection Time: 03/25/23 12:09 PM   Specimen: BLOOD  Result Value Ref Range Status   Specimen Description BLOOD LEFT [facility]  Final   Special Requests   Final    BOTTLES DRAWN AEROBIC AND ANAEROBIC Blood Culture adequate volume   Culture   Final    NO GROWTH 5 DAYS Performed at [facility], [address] [place], [place] [address]    Report Status 03/30/2023 FINAL  Final  Blood Culture (routine x 2)     Status: None   Collection Time: 03/25/23 12:10 PM   Specimen: BLOOD  Result Value Ref Range Status   Specimen Description BLOOD LEFT FA  Final  Special Requests   Final    BOTTLES DRAWN AEROBIC AND ANAEROBIC Blood Culture adequate volume   Culture   Final    NO GROWTH 5 DAYS Performed at [facility], [address] [place], [place] [address]    Report Status 03/30/2023 FINAL  Final  SARS Coronavirus 2 by RT PCR (hospital order, performed in [facility] hospital lab) *cepheid single result test* Anterior Nasal Swab     Status: None   Collection Time: 03/25/23  1:18 PM   Specimen: Anterior Nasal Swab  Result Value Ref Range Status   SARS Coronavirus 2 by RT PCR NEGATIVE NEGATIVE Final    Comment: (NOTE) SARS-CoV-2 target nucleic acids are NOT DETECTED.  The SARS-CoV-2 RNA is generally detectable in upper  and lower respiratory specimens during the acute phase of infection. The lowest concentration of SARS-CoV-2 viral copies this assay can detect is 250 copies / mL. A negative result does not preclude SARS-CoV-2 infection and should not be used as the sole basis for treatment or other patient management decisions.  A negative result may occur with improper specimen collection / handling, submission of specimen other than nasopharyngeal swab, presence of viral mutation(s) within the areas targeted by this assay, and inadequate number of viral copies (<250 copies / mL). A negative result must be combined with clinical observations, patient history, and epidemiological information.  Fact Sheet for Patients:   RoadLapTop.co.za  Fact Sheet for Healthcare Providers: http://kim-miller.com/  This test is not yet approved or  cleared by the [place] FDA and has been authorized for detection and/or diagnosis of SARS-CoV-2 by FDA under an Emergency Use Authorization (EUA).  This EUA will remain in effect (meaning this test can be used) for the duration of the COVID-19 declaration under Section 564(b)(1) of the Act, 21 U.S.C. section 360bbb-3(b)(1), unless the authorization is terminated or revoked sooner.  Performed at [facility], [address] [place], [place] [address]   MRSA Next Gen by PCR, Nasal     Status: None   Collection Time: 03/27/23  4:40 AM   Specimen: Nasal Mucosa; Nasal Swab  Result Value Ref Range Status   MRSA by PCR Next Gen NOT DETECTED NOT DETECTED Final    Comment: (NOTE) The GeneXpert MRSA Assay (FDA approved for NASAL specimens only), is one component of a comprehensive MRSA colonization surveillance program. It is not intended to diagnose MRSA infection nor to guide or monitor treatment for MRSA infections. Test performance is not FDA approved in patients less than 21 years old. Performed at [facility], [address] [place], [place] [address]   Respiratory (~20 pathogens) panel by PCR     Status: None   Collection Time: 03/28/23  4:00 PM   Specimen: Nasopharyngeal Swab; Respiratory  Result Value Ref Range Status   Adenovirus NOT DETECTED NOT DETECTED Final   Coronavirus 229E NOT DETECTED NOT DETECTED Final    Comment: (NOTE) The Coronavirus on the Respiratory Panel, DOES NOT test for the novel  Coronavirus (2019 nCoV)    Coronavirus HKU1 NOT DETECTED NOT DETECTED Final   Coronavirus NL63 NOT DETECTED NOT DETECTED Final   Coronavirus OC43 NOT DETECTED NOT DETECTED Final   Metapneumovirus NOT DETECTED NOT DETECTED Final   Rhinovirus / Enterovirus NOT DETECTED NOT DETECTED Final   Influenza A NOT DETECTED NOT DETECTED Final   Influenza B NOT DETECTED NOT DETECTED Final   Parainfluenza Virus 1 NOT DETECTED NOT DETECTED Final   Parainfluenza Virus  2 NOT DETECTED NOT DETECTED Final   Parainfluenza Virus 3 NOT DETECTED NOT DETECTED Final   Parainfluenza Virus 4 NOT DETECTED NOT DETECTED Final   Respiratory Syncytial Virus NOT DETECTED NOT DETECTED Final   Bordetella pertussis NOT DETECTED NOT DETECTED Final   Bordetella Parapertussis NOT DETECTED NOT DETECTED Final   Chlamydophila pneumoniae NOT DETECTED NOT DETECTED Final   Mycoplasma pneumoniae NOT DETECTED NOT DETECTED Final    Comment: Performed at [facility], [address], [place], [place] [address]  Body fluid culture w Gram Stain     Status: None   Collection Time: 03/29/23 10:27 AM   Specimen: PATH Cytology Pleural fluid  Result Value Ref Range Status   Specimen Description   Final    PLEURAL Performed at [facility], [address] [place], [place] [address]    Special Requests   Final    NONE Performed at [facility], [address] [place], [place] [address]    Gram Stain NO WBC SEEN NO ORGANISMS SEEN   Final   Culture   Final    NO GROWTH 3 DAYS Performed at  [facility], [address], [place], [place] [address]    Report Status 04/01/2023 FINAL  Final  MRSA Next Gen by PCR, Nasal     Status: None   Collection Time: 03/29/23  3:00 PM   Specimen: Nasal Mucosa; Nasal Swab  Result Value Ref Range Status   MRSA by PCR Next Gen NOT DETECTED NOT DETECTED Final    Comment: (NOTE) The GeneXpert MRSA Assay (FDA approved for NASAL specimens only), is one component of a comprehensive MRSA colonization surveillance program. It is not intended to diagnose MRSA infection nor to guide or monitor treatment for MRSA infections. Test performance is not FDA approved in patients less than 33 years old. Performed at [facility], [address] [place], [place] [address]   Group A Strep by PCR     Status: None   Collection Time: 03/30/23  6:00 PM   Specimen: Throat; Sterile Swab  Result Value Ref Range Status   Group A Strep by PCR NOT DETECTED NOT DETECTED Final    Comment: Performed at [facility], [address] [place], [place] [address]    Coagulation Studies: No results for input(s): "LABPROT", "INR" in the last 72 hours.   Urinalysis: No results for input(s): "COLORURINE", "LABSPEC", "PHURINE", "GLUCOSEU", "HGBUR", "BILIRUBINUR", "KETONESUR", "PROTEINUR", "UROBILINOGEN", "NITRITE", "LEUKOCYTESUR" in the last 72 hours.  Invalid input(s): "APPERANCEUR"    Imaging: No results found.   Medications:      amiodarone  200 mg Oral BID   apixaban  2.5 mg Oral BID   arformoterol  15 mcg Nebulization BID   aspirin EC  81 mg Oral Daily   carvedilol  3.125 mg Oral BID WC   Chlorhexidine Gluconate Cloth  6 each Topical Daily   cyanocobalamin  1,000 mcg Oral Daily   feeding supplement (NEPRO CARB STEADY)  237 mL Oral BID BM   gabapentin  100 mg Oral QHS   insulin aspart  0-15 Units Subcutaneous TID WC   insulin aspart  0-5 Units Subcutaneous QHS   insulin aspart  2 Units Subcutaneous TID WC   insulin glargine-yfgn   8 Units Subcutaneous QHS   isosorbide mononitrate  30 mg Oral Daily   multivitamin  1 tablet Oral BID   nystatin   Topical TID   polyethylene glycol  17 g  Oral Daily   promethazine  6.25 mg Oral QHS   revefenacin  175 mcg Nebulization Daily   rosuvastatin  10 mg Oral Once per day on Mon Wed Fri Sat   sodium bicarbonate  650 mg Oral BID   acetaminophen, alum & mag hydroxide-simeth, dextromethorphan-guaiFENesin, hydrALAZINE, levalbuterol, melatonin, ondansetron (ZOFRAN) IV, mouth rinse, oxyCODONE, phenol, senna-docusate, traZODone  Assessment/ Plan:  Ms. SHONTA SURATT is a 88 y.o.  female with diabetes mellitus type II, hypertension, coronary artery disease, peripheral arterial disease, tremor, CVA, congestive heart failure , anxiety, peripheral arterial diease and anemia who is admitted to [facility] on 03/25/2023 for COPD exacerbation (HCC) [J44.1] Chest pain [R07.9] Community acquired pneumonia, unspecified laterality [J18.9]  Acute kidney injury with acute metabolic acidosis on chronic kidney disease stage IIIB: baseline creatinine of 1.48, GFR of 34 on 12/09/2022. Acute kidney injury secondary to acute cardiorenal syndrome . Chronic kidney disease secondary to hypertension.  Creatinine stable today No acute indication for dialysis Palliative following   Hypertension with chronic kidney disease: with chronic systolic and diastolic congestive heart failure.  Appreciate cardiology input.  Heart failure regimen includes isosorbide/hydralazine and carvedilol Blood pressure control acceptable.   Hyponatremia: Sodium 131  4.  Hypokalemia Potassium corrected     LOS: 18   6/17/20242:33 PM

## 2023-04-12 NOTE — Progress Notes (Addendum)
[facility] Progress Note  Patient: Jill Hudson    WJX:[id number]  DOA: 03/25/2023     Date of Service: the patient was seen and examined on 04/12/2023  Chief Complaint  Patient presents with   Shortness of Breath   Brief hospital course: 88 year old female with history of COPD, HTN, HLD, DM2, stroke, TIA, GERD, anxiety, PVD, CAD, MI, former tobacco use, CKD stage III, GI bleed, iron deficiency anemia, PAD status post PCI comes to the hospital with shortness of breath and hypoxia. Patient was found to have bilateral pulmonary edema /effusion with elevated BNP.    Assessment and Plan:  Cardiogenic shock [facility]) Cardiology took off milrinone drip 6/7.  Not on any pressors at this point.   Acute on chronic systolic CHF (congestive heart failure) (HCC) Acute respiratory failure with hypoxia (HCC) Ischemic cardiomyopathy --Patient initially requiring BiPAP secondary to respiratory distress.  S/p supplemental O2 inhalation, gradually weaned off currently on room air. --cont hydralazine, Imdur and low-dose Coreg.  No ACE/ARB or spironolactone with her worsening kidney function. --6/12--holding lasix  per cardiology --6/13--remains off lasix.     Paroxysmal atrial fibrillation (HCC) Patient converted to normal sinus rhythm.  Taken off amiodarone drip and switched to oral on 6/7.  Heparin drip converted over to Eliquis on 6/7. Patient is on amiodarone 200 mg p.o. twice daily, continue for 1 month and then decrease dose as per cardiology.  Acute kidney injury superimposed on CKD (HCC) --AKI on CKD stage IIIa.  Creatinine now mid-3s --Nephrology will continue to talk with patient and family about next steps. -- Per nephrology patient is not the best candidate for dialysis. - bicarb consistently low will start sodium bicarb  Urinary retention Today patient reports that most of this hospitalization unable to urinate on her own, only with valsalva. Bladder scan today shows retention of  445 ml. Renal u/s ordered. Secure chat with Dr. Richardo Hanks, he advises we f/u results of renal ultrasound before making a plan. Would touch base with urology tomorrow once renal ultrasound results have returned.  NSTEMI (non-ST elevated myocardial infarction) (HCC) --With creatinine above 2, medical management.  -- Continue aspirin, Imdur, Coreg and low-dose Crestor.   Type II diabetes mellitus with renal manifestations (HCC) --Last hemoglobin A1c 7.9.  Hypoglycemic episode the other morning.  Decreased Semglee insulin down to 8 units at night.   --Continue sliding scale.   COPD exacerbation (HCC) --Continue nebulizers.   Essential hypertension --Continue hydralazine, Imdur and low-dose Coreg.   History of CVA (cerebrovascular accident) --Patient on Eliquis for stroke prevention   HLD (hyperlipidemia) --On Crestor   Iron deficiency anemia Stable. Last hemoglobin 8.4.  Continue to monitor.   Acute urinary retention: Patient required in and out catheter     Hyperkalemia: Previously treated. Resolved Hypokalemia due to lasix, Resolved  Hypertonic hyponatremia, serum osmolality 300 --Sodium 131 Monitor electrolytes   Pleural effusion Right thoracentesis removed 700 mL of fluid, transudate.  This was done by interventional radiology on 6/3.   Body mass index is 25.19 kg/m.  Interventions:  Consults : palliative care, [facility] cardiology and nephrology   Diet: Heart healthy diet DVT Prophylaxis: Therapeutic Anticoagulation with Eliquis    Advance goals of care discussion: DNR  Family Communication: daughter updated @ bedside 6/17  Disposition:  Has snf bed tomorrow  Subjective: no pain or sob, tolerating diet, no constipation  Physical Exam: General: NAD, lying comfortably Appear in no distress, affect appropriate Eyes: PERRLA ENT: Oral Mucosa Clear, moist  Neck: no  JVD,  Cardiovascular: S1 and S2 Present, soft rough murmur Respiratory: rales at bases otherwise  clear Abdomen: Bowel Sound present, Soft and no tenderness,  Skin: no rashes Extremities: trace Pedal edema, no calf tenderness Neurologic: without any new focal findings Vitals:   04/12/23 0447 04/12/23 0733 04/12/23 0759 04/12/23 1208  BP: (!) 128/44  (!) 126/42 (!) 124/108  Pulse: (!) 49  (!) 51 (!) 53  Resp: 18  16 16   Temp: 97.9 F (36.6 C)  98 F (36.7 C) (!) 97.5 F (36.4 C)  TempSrc:   Oral Oral  SpO2: 100% 98% 98% 96%  Weight:      Height:        Intake/Output Summary (Last 24 hours) at 04/12/2023 1354 Last data filed at 04/12/2023 1053 Gross per 24 hour  Intake 120 ml  Output --  Net 120 ml   Filed Weights   04/06/23 0500 04/08/23 0841 04/09/23 0730  Weight: 60.1 kg 58.4 kg 58.5 kg    Data Reviewed: I have personally reviewed and interpreted daily labs, tele strips, imagings as discussed above. I reviewed all nursing notes, pharmacy notes, vitals, pertinent old records I have discussed plan of care as described above with RN and patient/family.  CBC: Recent Labs  Lab 04/06/23 0536 04/09/23 1026 04/10/23 0529 04/11/23 0424 04/12/23 0348  WBC 14.2* 10.0 8.3 9.7 7.8  HGB 8.9* 8.4* 8.5* 8.9* 8.1*  HCT 28.2* 26.8* 26.8* 28.8* 26.1*  MCV 89.0 89.6 88.7 89.4 88.8  PLT 237 223 208 243 207   Basic Metabolic Panel: Recent Labs  Lab 04/08/23 0924 04/09/23 1026 04/10/23 0529 04/11/23 0424 04/12/23 0348  NA 132* 131* 133* 131* 131*  K 4.2 4.6 4.8 4.8 4.9  CL 99 100 102 99 101  CO2 20* 22 21* 21* 19*  GLUCOSE 135* 121* 86 123* 107*  BUN 69* 76* 80* 78* 78*  CREATININE 3.53* 3.80* 3.80* 3.78* 3.64*  CALCIUM 8.4* 8.3* 8.5* 8.5* 8.0*  MG  --  2.3 2.4 2.4 2.3  PHOS  --  4.9* 4.9* 5.0* 4.9*    Studies: No results found.  Scheduled Meds:  amiodarone  200 mg Oral BID   apixaban  2.5 mg Oral BID   arformoterol  15 mcg Nebulization BID   aspirin EC  81 mg Oral Daily   carvedilol  3.125 mg Oral BID WC   Chlorhexidine Gluconate Cloth  6 each Topical Daily    cyanocobalamin  1,000 mcg Oral Daily   feeding supplement (NEPRO CARB STEADY)  237 mL Oral BID BM   gabapentin  100 mg Oral QHS   insulin aspart  0-15 Units Subcutaneous TID WC   insulin aspart  0-5 Units Subcutaneous QHS   insulin aspart  2 Units Subcutaneous TID WC   insulin glargine-yfgn  8 Units Subcutaneous QHS   isosorbide mononitrate  30 mg Oral Daily   multivitamin  1 tablet Oral BID   nystatin   Topical TID   polyethylene glycol  17 g Oral Daily   promethazine  6.25 mg Oral QHS   revefenacin  175 mcg Nebulization Daily   rosuvastatin  10 mg Oral Once per day on Mon Wed Fri Sat   Continuous Infusions: PRN Meds: acetaminophen, alum & mag hydroxide-simeth, dextromethorphan-guaiFENesin, hydrALAZINE, levalbuterol, melatonin, ondansetron (ZOFRAN) IV, mouth rinse, oxyCODONE, phenol, senna-docusate, traZODone  Time spent: 35 minutes  Author: Shonna Chock, MD [facility] 04/12/2023 1:54 PM  To reach On-call, see care teams to locate the attending and  reach out to them via [email or web address]. If 7PM-7AM, please contact night-coverage If you still have difficulty reaching the attending provider, please page the [facility] (Director on Call) for [facility] on amion for assistance.

## 2023-04-12 NOTE — TOC Progression Note (Addendum)
Transition of Care [facility]) - Progression Note    Patient Details  Name: Jill Hudson MRN: [id number] Date of Birth: December 01, 1934  Transition of Care [facility]) CM/SW Contact  Margarito Liner, LCSW Phone Number: 04/12/2023, 10:48 AM  Clinical Narrative:   [facility] approved: [id number]. Valid 6/17-6/19. Left message for [facility] admissions coordinator to notify and confirm they will have a bed today.  2:17 pm: [facility] will have a bed tomorrow. Patient, daughter, MD, and RN are aware.  Expected Discharge Plan: Skilled Nursing Facility Barriers to Discharge: Continued Medical Work up  Expected Discharge Plan and Services       Living arrangements for the past 2 months: Single Family Home                                       Social Determinants of Health (SDOH) Interventions SDOH Screenings   Food Insecurity: No Food Insecurity (03/25/2023)  Housing: Low Risk  (03/25/2023)  Transportation Needs: No Transportation Needs (03/25/2023)  Utilities: Not At Risk (03/25/2023)  Alcohol Screen: Low Risk  (03/11/2022)  Depression (PHQ2-9): High Risk (01/01/2023)  Financial Resource Strain: Low Risk  (01/01/2023)  Physical Activity: Insufficiently Active (03/11/2022)  Social Connections: Socially Integrated (05/12/2022)  Stress: No Stress Concern Present (03/11/2022)  Tobacco Use: Medium Risk (03/25/2023)    Readmission Risk Interventions     No data to display

## 2023-04-12 NOTE — Plan of Care (Signed)
  Problem: Education: Goal: Ability to demonstrate management of disease process will improve Outcome: Progressing Goal: Ability to verbalize understanding of medication therapies will improve Outcome: Progressing Goal: Individualized Educational Video(s) Outcome: Progressing   Problem: Education: Goal: Ability to verbalize understanding of medication therapies will improve Outcome: Progressing   Problem: Education: Goal: Individualized Educational Video(s) Outcome: Progressing

## 2023-04-12 NOTE — Plan of Care (Signed)
  Problem: Education: Goal: Ability to demonstrate management of disease process will improve 04/12/2023 0247 by Charmian Muff, RN Outcome: Progressing 04/12/2023 0246 by Charmian Muff, RN Outcome: Progressing 04/12/2023 0132 by Charmian Muff, RN Outcome: Progressing Goal: Ability to verbalize understanding of medication therapies will improve 04/12/2023 0247 by Charmian Muff, RN Outcome: Progressing 04/12/2023 0132 by Charmian Muff, RN Outcome: Progressing Goal: Individualized Educational Video(s) Outcome: Progressing   Problem: Cardiac: Goal: Ability to achieve and maintain adequate cardiopulmonary perfusion will improve Outcome: Progressing

## 2023-04-12 NOTE — Progress Notes (Signed)
Advanced Heart Failure Team Progress Note   Primary Physician: Reubin Milan, MD PCP-Cardiologist:  None  Reason for Consultation: Acute systolic HF  Interval history  6/6 Switched to torsemide.  6/7 Off milrinone. Placed on oral amio.  6/8 Low dose coreg started.   Feels ok. Weak. But denies CP or SOB. Now working with PT. Weight down.   Objective:    Vital Signs:   Temp:  [97.9 F (36.6 C)-98.6 F (37 C)] 98 F (36.7 C) (06/17 0759) Pulse Rate:  [49-57] 51 (06/17 0759) Resp:  [16-21] 16 (06/17 0759) BP: (121-134)/(42-64) 126/42 (06/17 0759) SpO2:  [93 %-100 %] 98 % (06/17 0759) Last BM Date : 04/10/23  Weight change: Filed Weights   04/06/23 0500 04/08/23 0841 04/09/23 0730  Weight: 60.1 kg 58.4 kg 58.5 kg    Intake/Output:   Intake/Output Summary (Last 24 hours) at 04/12/2023 0904 Last data filed at 04/11/2023 1345 Gross per 24 hour  Intake 480 ml  Output --  Net 480 ml    Physical Exam    General:  Appears weak.  No resp difficulty HEENT: normal Neck: supple. JVP 7-8 Carotids 2+ bilat; no bruits. No lymphadenopathy or thryomegaly appreciated. Cor: PMI nondisplaced. Regular rate & rhythm. 2/6 MR Lungs: clear Abdomen: soft, nontender, nondistended. No hepatosplenomegaly. No bruits or masses. Good bowel sounds. Extremities: no cyanosis, clubbing, rash, edema Neuro: alert & orientedx3, cranial nerves grossly intact. moves all 4 extremities w/o difficulty. Affect pleasant  Telemetry   SR 50s Personally reviewed  Labs   Basic Metabolic Panel: Recent Labs  Lab 04/08/23 0924 04/09/23 1026 04/10/23 0529 04/11/23 0424 04/12/23 0348  NA 132* 131* 133* 131* 131*  K 4.2 4.6 4.8 4.8 4.9  CL 99 100 102 99 101  CO2 20* 22 21* 21* 19*  GLUCOSE 135* 121* 86 123* 107*  BUN 69* 76* 80* 78* 78*  CREATININE 3.53* 3.80* 3.80* 3.78* 3.64*  CALCIUM 8.4* 8.3* 8.5* 8.5* 8.0*  MG  --  2.3 2.4 2.4 2.3  PHOS  --  4.9* 4.9* 5.0* 4.9*    CBC: Recent Labs   Lab 04/06/23 0536 04/09/23 1026 04/10/23 0529 04/11/23 0424 04/12/23 0348  WBC 14.2* 10.0 8.3 9.7 7.8  HGB 8.9* 8.4* 8.5* 8.9* 8.1*  HCT 28.2* 26.8* 26.8* 28.8* 26.1*  MCV 89.0 89.6 88.7 89.4 88.8  PLT 237 223 208 243 207    BNP: BNP (last 3 results) Recent Labs    03/25/23 1001  BNP 971.3*    CBG: Recent Labs  Lab 04/11/23 0808 04/11/23 1242 04/11/23 1555 04/11/23 2039 04/12/23 0801  GLUCAP 104* 149* 149* 167* 84    Imaging   No results found.   Medications:     Current Medications:  amiodarone  200 mg Oral BID   apixaban  2.5 mg Oral BID   arformoterol  15 mcg Nebulization BID   aspirin EC  81 mg Oral Daily   carvedilol  3.125 mg Oral BID WC   Chlorhexidine Gluconate Cloth  6 each Topical Daily   cyanocobalamin  1,000 mcg Oral Daily   feeding supplement (NEPRO CARB STEADY)  237 mL Oral BID BM   gabapentin  100 mg Oral QHS   insulin aspart  0-15 Units Subcutaneous TID WC   insulin aspart  0-5 Units Subcutaneous QHS   insulin aspart  2 Units Subcutaneous TID WC   insulin glargine-yfgn  8 Units Subcutaneous QHS   isosorbide mononitrate  30 mg Oral Daily  multivitamin  1 tablet Oral BID   nystatin   Topical TID   polyethylene glycol  17 g Oral Daily   promethazine  6.25 mg Oral QHS   revefenacin  175 mcg Nebulization Daily   rosuvastatin  10 mg Oral Once per day on Mon Wed Fri Sat    Infusions:      Assessment/Plan   1. Acute systolic HF -> cardiogenic shock - Echo 2023 EF 55-60% - Echo this admit read as EF 30-35%. RV ok  -Per Dr Gala Romney EF 35-40% there is severe HK of basaliar to mid lateral wall and inferolateral wall - High-sensitivity troponin 225 -> 458 - >1186 -> 1165.  BNP 971  - Suspect this is an ischemic CM  - Developed low output HF and elevated volume status. Initial co-ox 44% CVP 21 (03/29/23) - She has severe MR; only option for treatment is medical management. Will increase hydralazine to 37.5mg  TID. Goal SBP ~110. Will  likely require palliative care at discharge.  - Continue low-dose carvedilol and imdur. If BP stable tomorrow add low-dose hydral 10 tid - No ACE/ARB/ARNI or MRA with CKD IV - Consider adding torsemide 40 daily as needed  2. NSTEMI - echo as above with RWMA - no longer having CP - continue ASA/statin - With persistent renal failure and no current angina will defer cath   3. AKI on CKD 3a - likely ATN/cardiorenal  - Scr 1.5 -> 2.8 -> 2.7 ->->-> 3.78 -> 3.64 - 6/6 renal u/s - no hydronephrosis.  - renal following - agree with nephrology; not a candidate for iHD.   4. Acute hypoxic respiratory failure - likely due primarily to HF and pleural effusions on top of underlying COPD - s/p R thora on 6/3 with 700cc out  - 6/7 Bilateral pleural effsions.   5. PAF  - back in NSR now - Initially on amio drip. Now on amio po amio 200 mg twice a day.  - On eliquis 2.5 mg twice a day. Reduced dose with age and creatinine.   6. Hypervolemic hyponatremia - restrict FW, treat HF - improving  7. Urinary retention  8. DNR/DNI/no dialysis - confirmed with patient and her daughter    Length of Stay: 60  Arvilla Meres, MD  04/12/2023, 9:04 AM  Advanced Heart Failure Team Pager [phone] (M-F; 7a - 5p)  Please contact [facility] for night-coverage after hours (4p -7a ) and weekends on [email or web address]

## 2023-04-12 NOTE — Plan of Care (Signed)
  Problem: Education: Goal: Ability to demonstrate management of disease process will improve Outcome: Progressing Goal: Ability to verbalize understanding of medication therapies will improve Outcome: Progressing   

## 2023-04-13 DIAGNOSIS — I25118 Atherosclerotic heart disease of native coronary artery with other forms of angina pectoris: Secondary | ICD-10-CM | POA: Diagnosis not present

## 2023-04-13 DIAGNOSIS — D508 Other iron deficiency anemias: Secondary | ICD-10-CM | POA: Diagnosis not present

## 2023-04-13 DIAGNOSIS — I214 Non-ST elevation (NSTEMI) myocardial infarction: Secondary | ICD-10-CM | POA: Diagnosis not present

## 2023-04-13 DIAGNOSIS — Z87891 Personal history of nicotine dependence: Secondary | ICD-10-CM

## 2023-04-13 DIAGNOSIS — I5023 Acute on chronic systolic (congestive) heart failure: Secondary | ICD-10-CM | POA: Diagnosis not present

## 2023-04-13 DIAGNOSIS — I1310 Hypertensive heart and chronic kidney disease without heart failure, with stage 1 through stage 4 chronic kidney disease, or unspecified chronic kidney disease: Secondary | ICD-10-CM

## 2023-04-13 DIAGNOSIS — N179 Acute kidney failure, unspecified: Secondary | ICD-10-CM | POA: Diagnosis not present

## 2023-04-13 DIAGNOSIS — E8721 Acute metabolic acidosis: Secondary | ICD-10-CM | POA: Diagnosis not present

## 2023-04-13 DIAGNOSIS — Z7901 Long term (current) use of anticoagulants: Secondary | ICD-10-CM

## 2023-04-13 DIAGNOSIS — N183 Chronic kidney disease, stage 3 unspecified: Secondary | ICD-10-CM | POA: Diagnosis not present

## 2023-04-13 LAB — BASIC METABOLIC PANEL
Anion gap: 11 (ref 5–15)
BUN: 68 mg/dL — ABNORMAL HIGH (ref 8–23)
CO2: 18 mmol/L — ABNORMAL LOW (ref 22–32)
Calcium: 8.2 mg/dL — ABNORMAL LOW (ref 8.9–10.3)
Chloride: 101 mmol/L (ref 98–111)
Creatinine, Ser: 3.33 mg/dL — ABNORMAL HIGH (ref 0.44–1.00)
GFR, Estimated: 13 mL/min — ABNORMAL LOW (ref >60)
Glucose, Bld: 108 mg/dL — ABNORMAL HIGH (ref 70–99)
Potassium: 4.5 mmol/L (ref 3.5–5.1)
Sodium: 130 mmol/L — ABNORMAL LOW (ref 135–145)

## 2023-04-13 LAB — CBC
HCT: 27.7 % — ABNORMAL LOW (ref 36.0–46.0)
Hemoglobin: 8.7 g/dL — ABNORMAL LOW (ref 12.0–15.0)
MCH: 27.8 pg (ref 26.0–34.0)
MCHC: 31.4 g/dL (ref 30.0–36.0)
MCV: 88.5 fL (ref 80.0–100.0)
Platelets: 235 10*3/uL (ref 150–400)
RBC: 3.13 MIL/uL — ABNORMAL LOW (ref 3.87–5.11)
RDW: 14.2 % (ref 11.5–15.5)
WBC: 8.3 10*3/uL (ref 4.0–10.5)
nRBC: 0 % (ref 0.0–0.2)

## 2023-04-13 LAB — GLUCOSE, CAPILLARY
Glucose-Capillary: 91 mg/dL (ref 70–99)
Glucose-Capillary: 190 mg/dL — ABNORMAL HIGH (ref 70–99)
Glucose-Capillary: 210 mg/dL — ABNORMAL HIGH (ref 70–99)
Glucose-Capillary: 185 mg/dL — ABNORMAL HIGH (ref 70–99)

## 2023-04-13 MED ORDER — TORSEMIDE 20 MG PO TABS: 40.0000 mg | ORAL_TABLET | Freq: Every day | ORAL | Status: DC | PRN

## 2023-04-13 MED ORDER — ALPRAZOLAM 0.25 MG PO TABS
0.2500 mg | ORAL_TABLET | Freq: Three times a day (TID) | ORAL | Status: DC | PRN
  Administered 2023-04-13 – 2023-04-19 (×4): 0.25 mg via ORAL
  Filled 2023-04-13 (×4): qty 1

## 2023-04-13 MED ORDER — HYDRALAZINE HCL 10 MG PO TABS
10.0000 mg | ORAL_TABLET | Freq: Three times a day (TID) | ORAL | Status: DC
  Administered 2023-04-13 – 2023-04-20 (×19): 10 mg via ORAL
  Filled 2023-04-13 (×19): qty 1

## 2023-04-13 NOTE — TOC Progression Note (Signed)
Transition of Care [facility]) - Progression Note    Patient Details  Name: Jill Hudson MRN: [id number] Date of Birth: 1934/11/12  [facility] of [facility]) CM/SW Contact  Margarito Liner, LCSW Phone Number: 04/13/2023, 1:19 PM  Clinical Narrative:  Notified [facility] admissions coordinator about plan to start HD.   Expected Discharge Plan: Skilled Nursing Facility Barriers to Discharge: Continued Medical Work up  Expected Discharge Plan and Services       Living arrangements for the past 2 months: Single Family Home                                       Social Determinants of Health (SDOH) Interventions SDOH Screenings   Food Insecurity: No Food Insecurity (03/25/2023)  Housing: Low Risk  (03/25/2023)  Transportation Needs: No Transportation Needs (03/25/2023)  Utilities: Not At Risk (03/25/2023)  Alcohol Screen: Low Risk  (03/11/2022)  Depression (PHQ2-9): High Risk (01/01/2023)  Financial Resource Strain: Low Risk  (01/01/2023)  Physical Activity: Insufficiently Active (03/11/2022)  Social Connections: Socially Integrated (05/12/2022)  Stress: No Stress Concern Present (03/11/2022)  Tobacco Use: Medium Risk (03/25/2023)    Readmission Risk Interventions     No data to display

## 2023-04-13 NOTE — Progress Notes (Signed)
[facility]  ROUNDING NOTE   Subjective:   Ms. BRIANNE SOBIE was admitted to [facility] on 03/25/2023 for COPD exacerbation [facility]) [J44.1] Chest pain [R07.9] Community acquired pneumonia, unspecified laterality [J18.9]  Patient sitting in chair Daughter at bedside Alert and oriented to self and place  Room air  Creatinine 3.33  Objective:  Vital signs in last 24 hours:  Temp:  [97.5 F (36.4 C)-98 F (36.7 C)] 97.7 F (36.5 C) (06/18 1223) Pulse Rate:  [48-98] 52 (06/18 1223) Resp:  [16-18] 18 (06/18 1223) BP: (121-140)/(54-85) 121/77 (06/18 1223) SpO2:  [92 %-97 %] 97 % (06/18 1223) Weight:  [58.6 kg] 58.6 kg (06/18 0812)  Weight change:  Filed Weights   04/08/23 0841 04/09/23 0730 04/13/23 0812  Weight: 58.4 kg 58.5 kg 58.6 kg    Intake/Output: I/O last 3 completed shifts: In: 600 [P.O.:600] Out: -    Intake/Output this shift:  Total I/O In: 240 [P.O.:240] Out: 10 [Urine:10]  Physical Exam: General: NAD, laying in bed  Head: Normocephalic, atraumatic. Moist oral mucosal membranes  Eyes: Anicteric  Lungs:  Diminished in bases, room air  Heart: regular  Abdomen:  Soft, nontender  Extremities:  trace peripheral edema.  Neurologic: Alert and oriented to self, moving all four extremities  Skin: warm        Basic Metabolic Panel: Recent Labs  Lab 04/09/23 1026 04/10/23 0529 04/11/23 0424 04/12/23 0348 04/13/23 0437  NA 131* 133* 131* 131* 130*  K 4.6 4.8 4.8 4.9 4.5  CL 100 102 99 101 101  CO2 22 21* 21* 19* 18*  GLUCOSE 121* 86 123* 107* 108*  BUN 76* 80* 78* 78* 68*  CREATININE 3.80* 3.80* 3.78* 3.64* 3.33*  CALCIUM 8.3* 8.5* 8.5* 8.0* 8.2*  MG 2.3 2.4 2.4 2.3  --   PHOS 4.9* 4.9* 5.0* 4.9*  --      Liver Function Tests: No results for input(s): "AST", "ALT", "ALKPHOS", "BILITOT", "PROT", "ALBUMIN" in the last 168 hours.  No results for input(s): "LIPASE", "AMYLASE" in the last 168 hours. No results for input(s): "AMMONIA" in the  last 168 hours.  CBC: Recent Labs  Lab 04/09/23 1026 04/10/23 0529 04/11/23 0424 04/12/23 0348 04/13/23 0437  WBC 10.0 8.3 9.7 7.8 8.3  HGB 8.4* 8.5* 8.9* 8.1* 8.7*  HCT 26.8* 26.8* 28.8* 26.1* 27.7*  MCV 89.6 88.7 89.4 88.8 88.5  PLT 223 208 243 207 235     Cardiac Enzymes: No results for input(s): "CKTOTAL", "CKMB", "CKMBINDEX", "TROPONINI" in the last 168 hours.  BNP: Invalid input(s): "POCBNP"  CBG: Recent Labs  Lab 04/12/23 1210 04/12/23 1532 04/12/23 2006 04/13/23 0814 04/13/23 1222  GLUCAP 108* 148* 186* 91 190*     Microbiology: Results for orders placed or performed during the hospital encounter of 03/25/23  Blood Culture (routine x 2)     Status: None   Collection Time: 03/25/23 12:09 PM   Specimen: BLOOD  Result Value Ref Range Status   Specimen Description BLOOD LEFT [facility]  Final   Special Requests   Final    BOTTLES DRAWN AEROBIC AND ANAEROBIC Blood Culture adequate volume   Culture   Final    NO GROWTH 5 DAYS Performed at [facility], [address], [place], [place] [address]    Report Status 03/30/2023 FINAL  Final  Blood Culture (routine x 2)     Status: None   Collection Time: 03/25/23 12:10 PM   Specimen: BLOOD  Result Value Ref Range Status  Specimen Description BLOOD LEFT FA  Final   Special Requests   Final    BOTTLES DRAWN AEROBIC AND ANAEROBIC Blood Culture adequate volume   Culture   Final    NO GROWTH 5 DAYS Performed at [facility], [address] [place], [place] [address]    Report Status 03/30/2023 FINAL  Final  SARS Coronavirus 2 by RT PCR (hospital order, performed in [facility] hospital lab) *cepheid single result test* Anterior Nasal Swab     Status: None   Collection Time: 03/25/23  1:18 PM   Specimen: Anterior Nasal Swab  Result Value Ref Range Status   SARS Coronavirus 2 by RT PCR NEGATIVE NEGATIVE Final    Comment: (NOTE) SARS-CoV-2 target nucleic acids are NOT DETECTED.  The SARS-CoV-2  RNA is generally detectable in upper and lower respiratory specimens during the acute phase of infection. The lowest concentration of SARS-CoV-2 viral copies this assay can detect is 250 copies / mL. A negative result does not preclude SARS-CoV-2 infection and should not be used as the sole basis for treatment or other patient management decisions.  A negative result may occur with improper specimen collection / handling, submission of specimen other than nasopharyngeal swab, presence of viral mutation(s) within the areas targeted by this assay, and inadequate number of viral copies (<250 copies / mL). A negative result must be combined with clinical observations, patient history, and epidemiological information.  Fact Sheet for Patients:   RoadLapTop.co.za  Fact Sheet for Healthcare Providers: http://kim-miller.com/  This test is not yet approved or  cleared by the [place] FDA and has been authorized for detection and/or diagnosis of SARS-CoV-2 by FDA under an Emergency Use Authorization (EUA).  This EUA will remain in effect (meaning this test can be used) for the duration of the COVID-19 declaration under Section 564(b)(1) of the Act, 21 U.S.C. section 360bbb-3(b)(1), unless the authorization is terminated or revoked sooner.  Performed at [facility], [address] [place], [place] [address]   MRSA Next Gen by PCR, Nasal     Status: None   Collection Time: 03/27/23  4:40 AM   Specimen: Nasal Mucosa; Nasal Swab  Result Value Ref Range Status   MRSA by PCR Next Gen NOT DETECTED NOT DETECTED Final    Comment: (NOTE) The GeneXpert MRSA Assay (FDA approved for NASAL specimens only), is one component of a comprehensive MRSA colonization surveillance program. It is not intended to diagnose MRSA infection nor to guide or monitor treatment for MRSA infections. Test performance is not FDA approved in patients less than 66  years old. Performed at [facility], [address] [place], [place] [address]   Respiratory (~20 pathogens) panel by PCR     Status: None   Collection Time: 03/28/23  4:00 PM   Specimen: Nasopharyngeal Swab; Respiratory  Result Value Ref Range Status   Adenovirus NOT DETECTED NOT DETECTED Final   Coronavirus 229E NOT DETECTED NOT DETECTED Final    Comment: (NOTE) The Coronavirus on the Respiratory Panel, DOES NOT test for the novel  Coronavirus (2019 nCoV)    Coronavirus HKU1 NOT DETECTED NOT DETECTED Final   Coronavirus NL63 NOT DETECTED NOT DETECTED Final   Coronavirus OC43 NOT DETECTED NOT DETECTED Final   Metapneumovirus NOT DETECTED NOT DETECTED Final   Rhinovirus / Enterovirus NOT DETECTED NOT DETECTED Final   Influenza A NOT DETECTED NOT DETECTED Final   Influenza B NOT DETECTED NOT DETECTED Final   Parainfluenza Virus 1  NOT DETECTED NOT DETECTED Final   Parainfluenza Virus 2 NOT DETECTED NOT DETECTED Final   Parainfluenza Virus 3 NOT DETECTED NOT DETECTED Final   Parainfluenza Virus 4 NOT DETECTED NOT DETECTED Final   Respiratory Syncytial Virus NOT DETECTED NOT DETECTED Final   Bordetella pertussis NOT DETECTED NOT DETECTED Final   Bordetella Parapertussis NOT DETECTED NOT DETECTED Final   Chlamydophila pneumoniae NOT DETECTED NOT DETECTED Final   Mycoplasma pneumoniae NOT DETECTED NOT DETECTED Final    Comment: Performed at [facility], [address], [place], [place] [address]  Body fluid culture w Gram Stain     Status: None   Collection Time: 03/29/23 10:27 AM   Specimen: PATH Cytology Pleural fluid  Result Value Ref Range Status   Specimen Description   Final    PLEURAL Performed at [facility], [address], [place], [place] [address]    Special Requests   Final    NONE Performed at [facility], [address] [place], [place] [address]    Gram Stain NO WBC SEEN NO ORGANISMS SEEN   Final   Culture   Final     NO GROWTH 3 DAYS Performed at [facility], [address] [place], [place] [address]    Report Status 04/01/2023 FINAL  Final  MRSA Next Gen by PCR, Nasal     Status: None   Collection Time: 03/29/23  3:00 PM   Specimen: Nasal Mucosa; Nasal Swab  Result Value Ref Range Status   MRSA by PCR Next Gen NOT DETECTED NOT DETECTED Final    Comment: (NOTE) The GeneXpert MRSA Assay (FDA approved for NASAL specimens only), is one component of a comprehensive MRSA colonization surveillance program. It is not intended to diagnose MRSA infection nor to guide or monitor treatment for MRSA infections. Test performance is not FDA approved in patients less than 35 years old. Performed at [facility], [address] [place], [place] [address]   Group A Strep by PCR     Status: None   Collection Time: 03/30/23  6:00 PM   Specimen: Throat; Sterile Swab  Result Value Ref Range Status   Group A Strep by PCR NOT DETECTED NOT DETECTED Final    Comment: Performed at [facility], [address] [place], [place] [address]    Coagulation Studies: No results for input(s): "LABPROT", "INR" in the last 72 hours.   Urinalysis: No results for input(s): "COLORURINE", "LABSPEC", "PHURINE", "GLUCOSEU", "HGBUR", "BILIRUBINUR", "KETONESUR", "PROTEINUR", "UROBILINOGEN", "NITRITE", "LEUKOCYTESUR" in the last 72 hours.  Invalid input(s): "APPERANCEUR"    Imaging: US RENAL  Result Date: 04/12/2023 CLINICAL DATA:  Urinary retention EXAM: RENAL / URINARY TRACT ULTRASOUND COMPLETE COMPARISON:  Renal ultrasound 04/01/2023 FINDINGS: Right Kidney: Renal measurements: 9.4 x 4.0 x 4.1 cm = volume: 80.4 mL. No collecting system dilatation or perinephric fluid. Likely small parapelvic renal cyst measuring 14 mm, similar to the previous when adjusting for technique. Left Kidney: Renal measurements: 10.7 x 4.4 x 4.2 cm = volume: 103.3 mL. No collecting system dilatation. Simple upper pole cyst  measuring 2.8 cm. Previously 2.4 cm. Not significantly changed when adjusting for differences in technique. Bladder: Appears normal for degree of bladder distention. Other: Ureteral jets are seen. Note is made of a pleural effusion on the right. Bladder volume 357 cc. IMPRESSION: No collecting system dilatation.  Bilateral renal cysts. Right-sided pleural effusion Electronically Signed   By: Karen Kays M.D.   On: 04/12/2023  18:37     Medications:      amiodarone  200 mg Oral BID   apixaban  2.5 mg Oral BID   arformoterol  15 mcg Nebulization BID   aspirin EC  81 mg Oral Daily   carvedilol  3.125 mg Oral BID WC   Chlorhexidine Gluconate Cloth  6 each Topical Daily   cyanocobalamin  1,000 mcg Oral Daily   feeding supplement (NEPRO CARB STEADY)  237 mL Oral BID BM   gabapentin  100 mg Oral QHS   hydrALAZINE  10 mg Oral Q8H   insulin aspart  0-15 Units Subcutaneous TID WC   insulin aspart  0-5 Units Subcutaneous QHS   insulin aspart  2 Units Subcutaneous TID WC   insulin glargine-yfgn  8 Units Subcutaneous QHS   isosorbide mononitrate  30 mg Oral Daily   multivitamin  1 tablet Oral BID   nystatin   Topical TID   polyethylene glycol  17 g Oral Daily   promethazine  6.25 mg Oral QHS   revefenacin  175 mcg Nebulization Daily   rosuvastatin  10 mg Oral Once per day on Mon Wed Fri Sat   sodium bicarbonate  650 mg Oral BID   acetaminophen, ALPRAZolam, alum & mag hydroxide-simeth, dextromethorphan-guaiFENesin, hydrALAZINE, levalbuterol, melatonin, ondansetron (ZOFRAN) IV, mouth rinse, oxyCODONE, phenol, senna-docusate, torsemide, traZODone  Assessment/ Plan:  Ms. SARELY BARRALL is a 88 y.o.  female with diabetes mellitus type II, hypertension, coronary artery disease, peripheral arterial disease, tremor, CVA, congestive heart failure , anxiety, peripheral arterial diease and anemia who is admitted to [facility] on 03/25/2023 for COPD exacerbation (HCC) [J44.1] Chest pain [R07.9] Community acquired  pneumonia, unspecified laterality [J18.9]  Acute kidney injury with acute metabolic acidosis on chronic kidney disease stage IIIB: baseline creatinine of 1.48, GFR of 34 on 12/09/2022. Acute kidney injury secondary to acute cardiorenal syndrome . Chronic kidney disease secondary to hypertension.  Creatinine slightly improved but remains below desired range. Discussed with family and patient that her kidney function has stalled and has not improved for a few days.  Family has agreed to proceed with dialysis Will consult vascular surgery for permcath placement tomorrow.    Hypertension with chronic kidney disease: with chronic systolic and diastolic congestive heart failure.  Appreciate cardiology input.  Heart failure regimen includes isosorbide/hydralazine and carvedilol Blood pressure stable  Hyponatremia: Sodium 130 today, will continue to monitor.  Encouraged to increase oral intake  4.  Hypokalemia Potassium corrected     LOS: 19   6/18/20241:59 PM

## 2023-04-13 NOTE — Progress Notes (Signed)
[facility]  - [place] [facility]   PATIENT NAME: Jill Hudson    MR#:  [id number]  DATE OF BIRTH:  May 23, 1935  SUBJECTIVE:   Patient seen earlier. Was eating breakfast. No family at that time. Spoke with daughter Jill Hudson on the phone. Patient unable to urinate much. Set on a bedside commode and peed10 mL Patient denies any bladder fullness. Bladder scan showed approximately 614 mL. Denies any shortness of breath.   VITALS:  Blood pressure 130/66, pulse (!) 57, temperature (!) 97.5 F (36.4 C), resp. rate 18, height 5' (1.524 m), weight 58.6 kg, SpO2 97 %.  PHYSICAL EXAMINATION:   GENERAL:  88 y.o.-year-old patient with no acute distress. Weak, deconditioned LUNGS: Normal breath sounds bilaterally, no wheezing CARDIOVASCULAR: S1, S2 normal. No murmur   ABDOMEN: Soft, nontender, nondistended. Bowel sounds present.  EXTREMITIES: trace edema b/l.    NEUROLOGIC: nonfocal  patient is alert and awake SKIN: No obvious rash, lesion, or ulcer.   LABORATORY PANEL:  CBC Recent Labs  Lab 04/13/23 0437  WBC 8.3  HGB 8.7*  HCT 27.7*  PLT 235    Chemistries  Recent Labs  Lab 04/12/23 0348 04/13/23 0437  NA 131* 130*  K 4.9 4.5  CL 101 101  CO2 19* 18*  GLUCOSE 107* 108*  BUN 78* 68*  CREATININE 3.64* 3.33*  CALCIUM 8.0* 8.2*  MG 2.3  --    Cardiac Enzymes No results for input(s): "TROPONINI" in the last 168 hours. RADIOLOGY:  US RENAL  Result Date: 04/12/2023 CLINICAL DATA:  Urinary retention EXAM: RENAL / URINARY TRACT ULTRASOUND COMPLETE COMPARISON:  Renal ultrasound 04/01/2023 FINDINGS: Right Kidney: Renal measurements: 9.4 x 4.0 x 4.1 cm = volume: 80.4 mL. No collecting system dilatation or perinephric fluid. Likely small parapelvic renal cyst measuring 14 mm, similar to the previous when adjusting for technique. Left Kidney: Renal measurements: 10.7 x 4.4 x 4.2 cm = volume: 103.3 mL. No collecting system dilatation. Simple upper pole cyst measuring 2.8  cm. Previously 2.4 cm. Not significantly changed when adjusting for differences in technique. Bladder: Appears normal for degree of bladder distention. Other: Ureteral jets are seen. Note is made of a pleural effusion on the right. Bladder volume 357 cc. IMPRESSION: No collecting system dilatation.  Bilateral renal cysts. Right-sided pleural effusion Electronically Signed   By: Karen Kays M.D.   On: 04/12/2023 18:37    Assessment and Plan  88 year old female with history of COPD, HTN, HLD, DM2, stroke, TIA, GERD, anxiety, PVD, CAD, MI, former tobacco use, CKD stage III, GI bleed, iron deficiency anemia, PAD status post PCI comes to the hospital with shortness of breath and hypoxia. Patient was found to have bilateral pulmonary edema /effusion with elevated BNP.      Assessment and Plan:  Cardiogenic shock [facility]) Cardiology took off milrinone drip 6/7.  Not on any pressors at this point.   Acute on chronic systolic CHF (congestive heart failure) (HCC) Acute respiratory failure with hypoxia (HCC) Ischemic cardiomyopathy --Patient initially requiring BiPAP secondary to respiratory distress.  S/p supplemental O2 inhalation, gradually weaned off currently on room air. --cont hydralazine, Imdur and low-dose Coreg.  No ACE/ARB or spironolactone with her worsening kidney function. --6/18--prn torsemide for now given worsening creat   Paroxysmal atrial fibrillation (HCC) --Patient converted to normal sinus rhythm.  Taken off amiodarone drip and switched to oral on 6/7. --  Heparin drip converted over to Eliquis on 6/7. --Patient is on amiodarone 200 mg p.o. twice  daily, continue for 1 month and then decrease dose as per cardiology.   Acute kidney injury superimposed on CKD (HCC) --AKI on CKD stage IIIa.  Creatinine now mid-3s --Nephrology Dr Cherylann Ratel discussed with patient's daughter and they are in agreement with hemodialysis. -- Vascular consultation placed   Acute urinary retention Today  patient reports that most of this hospitalization unable to urinate on her own, only with valsalva. Bladder scan today shows retention of 445 ml.  --Renal u/s no evidence of hydronephrosis. Shows bladder post void volume around 300. -- Patient unable to void. Bladder scan again today showed around 600 mL. Discussed with Dr. Richardo Hanks at present given no other option will place Foley catheter and have patient follow-up urology as outpatient for emptying study.  NSTEMI (non-ST elevated myocardial infarction) (HCC) --With creatinine above 2, medical management.  -- Continue aspirin, Imdur, Coreg and low-dose Crestor.   Type II diabetes mellitus with renal manifestations (HCC) --Last hemoglobin A1c 7.9.  Hypoglycemic episode the other morning.  Decreased Semglee insulin down to 8 units at night.   --Continue sliding scale.   COPD exacerbation (HCC) --Continue nebulizers.   Essential hypertension --Continue hydralazine, Imdur and low-dose Coreg.   History of CVA (cerebrovascular accident) --Patient on Eliquis for stroke prevention   HLD (hyperlipidemia) --On Crestor   Iron deficiency anemia Stable. Last hemoglobin 8.4.  Continue to monitor.   Acute urinary retention: Patient required in and out catheter     Hyperkalemia: Previously treated. Resolved Hypokalemia due to lasix, Resolved  Hypertonic hyponatremia, serum osmolality 300 --Sodium 131 Monitor electrolytes   Pleural effusion Right thoracentesis removed 700 mL of fluid, transudate.  This was done by interventional radiology on 6/3.   Body mass index is 25.19 kg/m.  Interventions:   Consults : palliative care, [facility] cardiology and nephrology    Diet: Heart healthy diet DVT Prophylaxis: Therapeutic Anticoagulation with Eliquis     Advance goals of care discussion: DNR   Family Communication: daughter updated @ on phone  6/18       TOTAL TIME TAKING CARE OF THIS PATIENT: 35 minutes.  >50% time spent on counselling and  coordination of care  Note: This dictation was prepared with Dragon dictation along with smaller phrase technology. Any transcriptional errors that result from this process are unintentional.  Enedina Finner M.D    [facility]   CC: Primary care physician; Reubin Milan, MD

## 2023-04-13 NOTE — Plan of Care (Signed)
Patient is participating in goals of care to meet goals for discharge.  Terrilyn Saver, RN     Problem: Education: Goal: Ability to demonstrate management of disease process will improve Outcome: Progressing Goal: Ability to verbalize understanding of medication therapies will improve Outcome: Progressing Goal: Individualized Educational Video(s) Outcome: Progressing   Problem: Activity: Goal: Capacity to carry out activities will improve Outcome: Progressing   Problem: Cardiac: Goal: Ability to achieve and maintain adequate cardiopulmonary perfusion will improve Outcome: Progressing   Problem: Education: Goal: Knowledge of disease or condition will improve Outcome: Progressing Goal: Knowledge of the prescribed therapeutic regimen will improve Outcome: Progressing Goal: Individualized Educational Video(s) Outcome: Progressing   Problem: Activity: Goal: Ability to tolerate increased activity will improve Outcome: Progressing Goal: Will verbalize the importance of balancing activity with adequate rest periods Outcome: Progressing   Problem: Respiratory: Goal: Ability to maintain a clear airway will improve Outcome: Progressing Goal: Levels of oxygenation will improve Outcome: Progressing Goal: Ability to maintain adequate ventilation will improve Outcome: Progressing   Problem: Activity: Goal: Ability to tolerate increased activity will improve Outcome: Progressing   Problem: Clinical Measurements: Goal: Ability to maintain a body temperature in the normal range will improve Outcome: Progressing   Problem: Respiratory: Goal: Ability to maintain adequate ventilation will improve Outcome: Progressing Goal: Ability to maintain a clear airway will improve Outcome: Progressing   Problem: Education: Goal: Ability to describe self-care measures that may prevent or decrease complications (Diabetes Survival Skills Education) will improve Outcome:  Progressing Goal: Individualized Educational Video(s) Outcome: Progressing   Problem: Coping: Goal: Ability to adjust to condition or change in health will improve Outcome: Progressing   Problem: Fluid Volume: Goal: Ability to maintain a balanced intake and output will improve Outcome: Progressing   Problem: Health Behavior/Discharge Planning: Goal: Ability to identify and utilize available resources and services will improve Outcome: Progressing Goal: Ability to manage health-related needs will improve Outcome: Progressing   Problem: Metabolic: Goal: Ability to maintain appropriate glucose levels will improve Outcome: Progressing   Problem: Nutritional: Goal: Maintenance of adequate nutrition will improve Outcome: Progressing Goal: Progress toward achieving an optimal weight will improve Outcome: Progressing   Problem: Skin Integrity: Goal: Risk for impaired skin integrity will decrease Outcome: Progressing   Problem: Tissue Perfusion: Goal: Adequacy of tissue perfusion will improve Outcome: Progressing   Problem: Education: Goal: Knowledge of General Education information will improve Description: Including pain rating scale, medication(s)/side effects and non-pharmacologic comfort measures Outcome: Progressing   Problem: Health Behavior/Discharge Planning: Goal: Ability to manage health-related needs will improve Outcome: Progressing   Problem: Clinical Measurements: Goal: Ability to maintain clinical measurements within normal limits will improve Outcome: Progressing Goal: Will remain free from infection Outcome: Progressing Goal: Diagnostic test results will improve Outcome: Progressing Goal: Respiratory complications will improve Outcome: Progressing Goal: Cardiovascular complication will be avoided Outcome: Progressing   Problem: Activity: Goal: Risk for activity intolerance will decrease Outcome: Progressing   Problem: Nutrition: Goal: Adequate  nutrition will be maintained Outcome: Progressing   Problem: Coping: Goal: Level of anxiety will decrease Outcome: Progressing   Problem: Elimination: Goal: Will not experience complications related to bowel motility Outcome: Progressing Goal: Will not experience complications related to urinary retention Outcome: Progressing   Problem: Pain Managment: Goal: General experience of comfort will improve Outcome: Progressing   Problem: Safety: Goal: Ability to remain free from injury will improve Outcome: Progressing   Problem: Skin Integrity: Goal: Risk for impaired skin integrity will decrease Outcome: Progressing

## 2023-04-13 NOTE — Progress Notes (Signed)
Rounding Note    Patient Name: ESABEL TESMER Date of Encounter: 04/13/2023  [facility] Cardiologist: None   Subjective   Less short of breath today.  No chest pain or edema.  Inpatient Medications    Scheduled Meds:  amiodarone  200 mg Oral BID   apixaban  2.5 mg Oral BID   arformoterol  15 mcg Nebulization BID   aspirin EC  81 mg Oral Daily   carvedilol  3.125 mg Oral BID WC   Chlorhexidine Gluconate Cloth  6 each Topical Daily   cyanocobalamin  1,000 mcg Oral Daily   feeding supplement (NEPRO CARB STEADY)  237 mL Oral BID BM   gabapentin  100 mg Oral QHS   insulin aspart  0-15 Units Subcutaneous TID WC   insulin aspart  0-5 Units Subcutaneous QHS   insulin aspart  2 Units Subcutaneous TID WC   insulin glargine-yfgn  8 Units Subcutaneous QHS   isosorbide mononitrate  30 mg Oral Daily   multivitamin  1 tablet Oral BID   nystatin   Topical TID   polyethylene glycol  17 g Oral Daily   promethazine  6.25 mg Oral QHS   revefenacin  175 mcg Nebulization Daily   rosuvastatin  10 mg Oral Once per day on Mon Wed Fri Sat   sodium bicarbonate  650 mg Oral BID   Continuous Infusions:  PRN Meds: acetaminophen, ALPRAZolam, alum & mag hydroxide-simeth, dextromethorphan-guaiFENesin, hydrALAZINE, levalbuterol, melatonin, ondansetron (ZOFRAN) IV, mouth rinse, oxyCODONE, phenol, senna-docusate, torsemide, traZODone   Vital Signs    Vitals:   04/12/23 2310 04/13/23 0342 04/13/23 0759 04/13/23 0812  BP: 139/69 (!) 140/54  130/66  Pulse: (!) 55 (!) 55  (!) 57  Resp: 18 18  18   Temp: 98 F (36.7 C) 97.7 F (36.5 C)  (!) 97.5 F (36.4 C)  TempSrc:      SpO2: 95% 94% 93% 97%  Weight:    58.6 kg  Height:        Intake/Output Summary (Last 24 hours) at 04/13/2023 1046 Last data filed at 04/13/2023 1027 Gross per 24 hour  Intake 840 ml  Output 10 ml  Net 830 ml      04/13/2023    8:12 AM 04/09/2023    7:30 AM 04/08/2023    8:41 AM  Last 3 Weights  Weight (lbs)  129 lb 3.2 oz 129 lb 128 lb 12.8 oz  Weight (kg) 58.605 kg 58.514 kg 58.423 kg      Telemetry    NSR - Personally Reviewed  ECG    No new tracing  Physical Exam   GEN: No acute distress.   Neck: JVP ~6 cm Cardiac: RRR, no murmurs, rubs, or gallops.  Respiratory: Diminished breath sounds throughout GI: Soft, nontender, non-distended  MS: No edema; No deformity. Neuro:  Nonfocal  Psych: Normal affect   Labs    High Sensitivity Troponin:   Recent Labs  Lab 03/25/23 1857 03/25/23 2156 03/26/23 0444 03/26/23 1428 03/26/23 1711  TROPONINIHS 225* 458* 1,186* 1,165* 1,263*     Chemistry Recent Labs  Lab 04/10/23 0529 04/11/23 0424 04/12/23 0348 04/13/23 0437  NA 133* 131* 131* 130*  K 4.8 4.8 4.9 4.5  CL 102 99 101 101  CO2 21* 21* 19* 18*  GLUCOSE 86 123* 107* 108*  BUN 80* 78* 78* 68*  CREATININE 3.80* 3.78* 3.64* 3.33*  CALCIUM 8.5* 8.5* 8.0* 8.2*  MG 2.4 2.4 2.3  --   [facility]  11* 11* 12* 13*  ANIONGAP 10 11 11 11     Lipids No results for input(s): "CHOL", "TRIG", "HDL", "LABVLDL", "LDLCALC", "CHOLHDL" in the last 168 hours.  Hematology Recent Labs  Lab 04/11/23 0424 04/12/23 0348 04/13/23 0437  WBC 9.7 7.8 8.3  RBC 3.22* 2.94* 3.13*  HGB 8.9* 8.1* 8.7*  HCT 28.8* 26.1* 27.7*  MCV 89.4 88.8 88.5  MCH 27.6 27.6 27.8  MCHC 30.9 31.0 31.4  RDW 14.0 14.0 14.2  PLT 243 207 235   Thyroid No results for input(s): "TSH", "FREET4" in the last 168 hours.  BNPNo results for input(s): "BNP", "PROBNP" in the last 168 hours.  DDimer No results for input(s): "DDIMER" in the last 168 hours.   Radiology    US RENAL  Result Date: 04/12/2023 CLINICAL DATA:  Urinary retention EXAM: RENAL / URINARY TRACT ULTRASOUND COMPLETE COMPARISON:  Renal ultrasound 04/01/2023 FINDINGS: Right Kidney: Renal measurements: 9.4 x 4.0 x 4.1 cm = volume: 80.4 mL. No collecting system dilatation or perinephric fluid. Likely small parapelvic renal cyst measuring 14 mm, similar to the  previous when adjusting for technique. Left Kidney: Renal measurements: 10.7 x 4.4 x 4.2 cm = volume: 103.3 mL. No collecting system dilatation. Simple upper pole cyst measuring 2.8 cm. Previously 2.4 cm. Not significantly changed when adjusting for differences in technique. Bladder: Appears normal for degree of bladder distention. Other: Ureteral jets are seen. Note is made of a pleural effusion on the right. Bladder volume 357 cc. IMPRESSION: No collecting system dilatation.  Bilateral renal cysts. Right-sided pleural effusion Electronically Signed   By: Karen Kays M.D.   On: 04/12/2023 18:37    Cardiac Studies   Limited echo (04/02/2023):  1. Left ventricular ejection fraction, by estimation, is 35 to 40%. The  left ventricle has moderately decreased function. The left ventricle  demonstrates global hypokinesis. Left ventricular diastolic parameters are  indeterminate.   2. Right ventricular systolic function is normal. The right ventricular  size is normal. Tricuspid regurgitation signal is inadequate for assessing  PA pressure.   3. Left atrial size was moderately dilated.   4. Moderate pleural effusion in the left lateral region.   5. The mitral valve is normal in structure. Moderate to severe mitral  valve regurgitation. No evidence of mitral stenosis.   6. The aortic valve is normal in structure. Aortic valve regurgitation is  not visualized. No aortic stenosis is present.   7. The inferior vena cava is normal in size with <50% respiratory  variability, suggesting right atrial pressure of 8 mmHg.   Patient Profile     88 y.o. female with diabetes mellitus type II, hypertension, coronary artery disease, peripheral arterial disease, tremor, CVA, congestive heart failure and anemia who is admitted to [facility] on 03/25/2023 for COPD exacerbation complicated by acute on chronic HFrEF and NSTEMI.  Assessment & Plan    NSTEMI: No further chest pain.  Defer invasive management given advanced  CKD and resolution of chest pain. -Continue ASA. -Continue carvedilol. -Continue rosuvastatin 10 mg daily; LDL very well-controlled. -Continue Imdur.  Acute on chronic HFrEF: Appears euvolemic on exam.  I/O's not accurately recorded. -Continue current dose of carvedilol. -Add hydralazine 10 mg TID. -Unable to add ACEI/ARB/AA due to renal insufficiency.  Acute kidney injury superimposed on chronic kidney disease: Creatinine slightly improved today. -Maintain net even fluid balance. -Avoid nephrotoxic drugs.  PAF: Maintaining sinus rhythm. -Continue carvedilol and Xarelto.  Hyponatremia: Na stable.  Continue free water restriction.  For questions or updates, please contact [facility] Please consult [email or web address] for contact info under [facility].     Signed, Yvonne Kendall, MD  04/13/2023, 10:46 AM

## 2023-04-13 NOTE — Consult Note (Signed)
Hospital Consult    Reason for Consult:  Insertion Dialysis Perma catheter  Requesting Physician:  Malachi Carl NP  MRN #:  [id number]  History of Present Illness: This is a 88 y.o. female with history of COPD, HTN, HLD, DM2, stroke, TIA, GERD, anxiety, PVD, CAD, MI, former tobacco use, CKD stage III, GI bleed, iron deficiency anemia, PAD status post PCI comes to the hospital with shortness of breath and hypoxia. Patient was found to have bilateral pulmonary edema /effusion with elevated BNP. Patient currently has a BUN of 68 and Creatinine of 3.33. Nephrology consults vascular surgery for dialysis perma catheter placement for hemodialysis.  On exam today the patient rests comfortably in bedside chair. Patient endorses feeling weak. Patient denies any chest pain or shortness of breath. No other complaints. Vitals all remain stable.     Past Medical History:  Diagnosis Date   Acute CHF (congestive heart failure) (HCC) 02/18/2020   Acute diastolic CHF (congestive heart failure) (HCC)    Acute kidney injury superimposed on CKD (HCC)    Allergies    Anxiety    Arthritis    spine and shoulder   Atherosclerosis of artery of extremity with rest pain (HCC) 10/12/2018   Cancer (HCC)    skin   CHF (congestive heart failure) (HCC)    Diabetes mellitus without complication (HCC)    Diverticulosis of large intestine with hemorrhage    GI bleeding 12/25/2019   Heart murmur    Hyperlipidemia    Hypertension    Macula lutea degeneration    Mitral and aortic valve disease    Myocardial infarction (HCC)    may have had a "light" heart attack   Non-ST elevation (NSTEMI) myocardial infarction (HCC)    Occasional tremors    PAD (peripheral artery disease) (HCC)    Rectal bleeding    Shingles    patient unaware but daughter confirms. it was a long time ago   Stroke [facility]) 01/2017   may have had a slight stroke   TIA (transient ischemic attack) 01/2017   UTI (urinary tract infection)     Vascular disease, peripheral (HCC)     Past Surgical History:  Procedure Laterality Date   CARDIAC CATHETERIZATION  1998   40% LM, 95% Ramus interm   CATARACT EXTRACTION, BILATERAL     COLONOSCOPY WITH PROPOFOL N/A 08/12/2018   Procedure: COLONOSCOPY WITH PROPOFOL;  Surgeon: Midge Minium, MD;  Location: [facility] ENDOSCOPY;  Service: Endoscopy;  Laterality: N/A;   ENDARTERECTOMY FEMORAL Left 10/12/2018   Procedure: ENDARTERECTOMY FEMORAL;  Surgeon: Renford Dills, MD;  Location: [facility] ORS;  Service: Vascular;  Laterality: Left;  angioplasty and left SFA stent placement   EYE SURGERY Bilateral    cataract extractions   LOWER EXTREMITY ANGIOGRAPHY Left 08/23/2017   Procedure: Lower Extremity Angiography;  Surgeon: Annice Needy, MD;  Location: [facility] INVASIVE CV LAB;  Service: Cardiovascular;  Laterality: Left;   LOWER EXTREMITY ANGIOGRAPHY Left 07/26/2018   Procedure: LOWER EXTREMITY ANGIOGRAPHY;  Surgeon: Renford Dills, MD;  Location: [facility] INVASIVE CV LAB;  Service: Cardiovascular;  Laterality: Left;   LOWER EXTREMITY ANGIOGRAPHY Left 09/16/2018   Procedure: LOWER EXTREMITY ANGIOGRAPHY;  Surgeon: Renford Dills, MD;  Location: [facility] INVASIVE CV LAB;  Service: Cardiovascular;  Laterality: Left;   PTCA  08/2013   Left common iliac   PTCA  12/2012   left ext iliac   RIGHT HEART CATH N/A 02/23/2020   Procedure: RIGHT HEART CATH;  Surgeon: Antonieta Iba,  MD;  Location: [facility] INVASIVE CV LAB;  Service: Cardiovascular;  Laterality: N/A;   TUBAL LIGATION      Allergies  Allergen Reactions   Cefdinir Diarrhea   Saxagliptin Diarrhea   Epinephrine Other (See Comments)    Patient does not remember what happens when she uses this   Evolocumab     Pain with injection   Atorvastatin Other (See Comments)    Muscle aches   Codeine Other (See Comments)    Upset stomach   Ezetimibe Other (See Comments)    Myalgias(ZETIA)   Limonene Rash    Patient does not recall this reaction    Nitrofurantoin Rash and Other (See Comments)    Pruitus   Sulfa Antibiotics Rash and Other (See Comments)    Sore mouth     Prior to Admission medications   Medication Sig Start Date End Date Taking? Authorizing Provider  acetaminophen (TYLENOL) 650 MG CR tablet Take 1,300 mg by mouth every 8 (eight) hours as needed for pain.   Yes [provider]  albuterol (VENTOLIN HFA) 108 (90 Base) MCG/ACT inhaler Inhale 2 puffs into the lungs every 6 (six) hours as needed for wheezing or shortness of breath. 01/01/23  Yes Reubin Milan, MD  amLODipine (NORVASC) 5 MG tablet Take 5 mg by mouth in the morning and at bedtime. 09/03/20  Yes [provider]  apixaban (ELIQUIS) 2.5 MG TABS tablet TAKE 1 TABLET(2.5 MG) BY MOUTH TWICE DAILY 03/24/22  Yes Reubin Milan, MD  cloNIDine (CATAPRES) 0.1 MG tablet Take 0.1 mg by mouth 2 (two) times daily as needed. 08/19/21  Yes [provider]  Cyanocobalamin 1000 MCG TBCR Take 1,000 mcg by mouth daily.   Yes [provider]  ferrous sulfate 325 (65 FE) MG tablet Take 325 mg by mouth daily with breakfast.   Yes [provider]  Fluticasone-Umeclidin-Vilant (TRELEGY ELLIPTA) 100-62.5-25 MCG/ACT AEPB Inhale 1 puff into the lungs daily. 02/19/23  Yes Reubin Milan, MD  gabapentin (NEURONTIN) 100 MG capsule TAKE 1 CAPSULE(100 MG) BY MOUTH AT BEDTIME 09/24/22  Yes Reubin Milan, MD  insulin glargine (LANTUS) 100 UNIT/ML injection Inject 0.05 mLs (5 Units total) into the skin daily. 03/24/22  Yes Reubin Milan, MD  Insulin Lispro w/ Trans Port 100 UNIT/ML SOPN 3 Units daily. ( Humalog ) 04/08/21  Yes [provider]  ipratropium-albuterol (DUONEB) 0.5-2.5 (3) MG/3ML SOLN Take 3 mLs by nebulization every 6 (six) hours as needed. 02/19/23  Yes Reubin Milan, MD  losartan (COZAAR) 25 MG tablet Take 25 mg by mouth 2 (two) times daily. Unsure of dosage   Yes [provider]  melatonin 5 MG TABS Take 0.5  tablets (2.5 mg total) by mouth at bedtime as needed (sleep). 02/26/20  Yes Wieting, Richard, MD  Multiple Vitamins-Minerals (PRESERVISION AREDS PO) Take 1 capsule by mouth 2 (two) times daily.    Yes [provider]  promethazine-dextromethorphan (PROMETHAZINE-DM) 6.25-15 MG/5ML syrup TAKE 5 ML BY MOUTH EVERY NIGHT AT BEDTIME 03/19/23  Yes Reubin Milan, MD  rosuvastatin (CRESTOR) 10 MG tablet Take 1 tablet (10 mg total) by mouth 4 (four) times a week. Monday, Wed, Friday and Saturday 01/02/23  Yes Reubin Milan, MD  torsemide (DEMADEX) 20 MG tablet Take 2 tablets (40 mg total) by mouth daily. Patient taking differently: Take 40 mg by mouth 2 (two) times daily. 1/2 tablet morning and 1/2 tablet at night. 02/27/20  Yes Alford Highland, MD  feeding  supplement, GLUCERNA SHAKE, (GLUCERNA SHAKE) LIQD Take 237 mLs by mouth 3 (three) times daily between meals. 12/28/19   Alford Highland, MD  Insulin Pen Needle (B-D ULTRAFINE III SHORT PEN) 31G X 8 MM MISC USE AS DIRECTED 03/24/22   Reubin Milan, MD    Social History   Socioeconomic History   Marital status: Widowed    Spouse name: kermit   Number of children: 3   Years of education: Not on file   Highest education level: 12th grade  Occupational History   Occupation: Retired    Comment: homemaker  Tobacco Use   Smoking status: Former    Packs/day: 2.00    Years: 20.00    Additional pack years: 0.00    Total pack years: 40.00    Types: Cigarettes    Quit date: 1980    Years since quitting: 44.4   Smokeless tobacco: Never   Tobacco comments:    smoking cessation materials not required  Vaping Use   Vaping Use: Never used  Substance and Sexual Activity   Alcohol use: No    Alcohol/week: 0.0 standard drinks of alcohol   Drug use: No   Sexual activity: Not Currently  Other Topics Concern   Not on file  Social History Narrative   Husband has dementia and may wander. Requires constant supervision. Pt's daughter also lives  with them. Her son [name] is a Insurance account manager.    Social Determinants of Health   Financial Resource Strain: Low Risk  (01/01/2023)   Overall Financial Resource Strain (CARDIA)    Difficulty of Paying Living Expenses: Not hard at all  Food Insecurity: No Food Insecurity (03/25/2023)   Hunger Vital Sign    Worried About Running Out of Food in the Last Year: Never true    Ran Out of Food in the Last Year: Never true  Transportation Needs: No Transportation Needs (03/25/2023)   PRAPARE - Administrator, Civil Service (Medical): No    Lack of Transportation (Non-Medical): No  Physical Activity: Insufficiently Active (03/11/2022)   Exercise Vital Sign    Days of Exercise per Week: 3 days    Minutes of Exercise per Session: 10 min  Stress: No Stress Concern Present (03/11/2022)   [employer] of Occupational Health - Occupational Stress Questionnaire    Feeling of Stress : Not at all  Social Connections: Socially Integrated (05/12/2022)   Social Connection and Isolation Panel [NHANES]    Frequency of Communication with Friends and Family: More than three times a week    Frequency of Social Gatherings with Friends and Family: More than three times a week    Attends Religious Services: More than 4 times per year    Active Member of [employer] or Organizations: Yes    Attends Engineer, structural: More than 4 times per year    Marital Status: Married  Catering manager Violence: Not At Risk (03/25/2023)   Humiliation, Afraid, Rape, and Kick questionnaire    Fear of Current or Ex-Partner: No    Emotionally Abused: No    Physically Abused: No    Sexually Abused: No     Family History  Problem Relation Age of Onset   Dementia Mother    Diabetes Father     ROS: Otherwise negative unless mentioned in HPI  Physical Examination  Vitals:   04/13/23 0759 04/13/23 0812  BP:  130/66  Pulse:  (!) 57  Resp:  18  Temp:  (!) 97.5 F (36.4  C)  SpO2: 93% 97%   Body mass index  is 25.23 kg/m.  General:  WDWN in NAD Gait: Not observed HENT: WNL, normocephalic Pulmonary: normal non-labored breathing, without Rales, rhonchi,  wheezing Cardiac: irregular, hx of atrial fibrillation, without  Murmurs, rubs or gallops; without carotid bruits Abdomen: Positive bowel sounds throughout, soft, NT/ND, no masses Skin: without rashes Vascular Exam/Pulses: Palpable pulses throughout.  Extremities: without ischemic changes, without Gangrene , without cellulitis; without open wounds;  Musculoskeletal: no muscle wasting or atrophy  Neurologic: A&O X 3;  No focal weakness or paresthesias are detected; speech is fluent/normal Psychiatric:  The pt has Normal affect. Lymph:  Unremarkable  CBC    Component Value Date/Time   WBC 8.3 04/13/2023 0437   RBC 3.13 (L) 04/13/2023 0437   HGB 8.7 (L) 04/13/2023 0437   HGB 11.2 03/24/2022 1053   HCT 27.7 (L) 04/13/2023 0437   HCT 33.0 (L) 03/24/2022 1053   PLT 235 04/13/2023 0437   PLT 273 03/24/2022 1053   MCV 88.5 04/13/2023 0437   MCV 91 03/24/2022 1053   MCV 87 09/09/2013 0413   MCH 27.8 04/13/2023 0437   MCHC 31.4 04/13/2023 0437   RDW 14.2 04/13/2023 0437   RDW 11.7 03/24/2022 1053   RDW 12.8 09/09/2013 0413   LYMPHSABS 1.7 03/24/2022 1053   LYMPHSABS 1.9 09/09/2013 0413   MONOABS 0.7 07/20/2020 1727   MONOABS 0.6 09/09/2013 0413   EOSABS 0.3 03/24/2022 1053   EOSABS 0.2 09/09/2013 0413   BASOSABS 0.1 03/24/2022 1053   BASOSABS 0.1 09/09/2013 0413    BMET    Component Value Date/Time   NA 130 (L) 04/13/2023 0437   NA 137 03/24/2022 1053   NA 138 05/29/2014 1202   K 4.5 04/13/2023 0437   K 5.1 05/29/2014 1202   CL 101 04/13/2023 0437   CL 106 05/29/2014 1202   CO2 18 (L) 04/13/2023 0437   CO2 25 05/29/2014 1202   GLUCOSE 108 (H) 04/13/2023 0437   GLUCOSE 269 (H) 05/29/2014 1202   BUN 68 (H) 04/13/2023 0437   BUN 33 (H) 03/24/2022 1053   BUN 19 (H) 05/29/2014 1202   CREATININE 3.33 (H) 04/13/2023 0437    CREATININE 1.10 05/29/2014 1202   CALCIUM 8.2 (L) 04/13/2023 0437   CALCIUM 9.1 05/29/2014 1202   GFRNONAA 13 (L) 04/13/2023 0437   GFRNONAA 48 (L) 05/29/2014 1202   GFRAA 39 (L) 07/20/2020 1727   GFRAA 56 (L) 05/29/2014 1202    COAGS: Lab Results  Component Value Date   INR 1.4 (H) 03/26/2023   INR 1.1 07/20/2020   INR 1.2 02/20/2020     Non-Invasive Vascular Imaging:   None Ordered  Statin:  Yes.   Beta Blocker:  Yes.   Aspirin:  No. ACEI:  No. ARB:  No. CCB use:  No Other antiplatelets/anticoagulants:  Yes.   Eliquis 2.5 mg BID    ASSESSMENT/PLAN: This is a 88 y.o. female comes to the hospital with shortness of breath and hypoxia. Upon work up patient noted to have AKI with acute metabolic acidosis secondary to cardiorenal syndrome.   PLAN: Vascular surgery plans on taking the patient to the vascular lab on 04/14/2023 for insertion of a Dialysis perma catheter for hemodialysis. I discussed in detail with the patient the procedure, benefits, risks, and complications. She verbalized her understanding. I answered all the patients questions this morning. She would like to proceed with the procedure. She will be made NPO after midnight.    -  I discussed in detail the plan with Dr Vilinda Flake MD and he agrees with the plan.    Marcie [facility] 04/13/2023 11:35 AM

## 2023-04-14 ENCOUNTER — Encounter
Admission: EM | Disposition: A | Discharge status: Hospice | Payer: Self-pay | Source: Home / Self Care | Attending: Internal Medicine

## 2023-04-14 DIAGNOSIS — R570 Cardiogenic shock: Secondary | ICD-10-CM | POA: Diagnosis not present

## 2023-04-14 DIAGNOSIS — I5023 Acute on chronic systolic (congestive) heart failure: Secondary | ICD-10-CM | POA: Diagnosis not present

## 2023-04-14 DIAGNOSIS — Z7189 Other specified counseling: Secondary | ICD-10-CM | POA: Diagnosis not present

## 2023-04-14 DIAGNOSIS — I214 Non-ST elevation (NSTEMI) myocardial infarction: Secondary | ICD-10-CM | POA: Diagnosis not present

## 2023-04-14 LAB — GLUCOSE, CAPILLARY
Glucose-Capillary: 94 mg/dL (ref 70–99)
Glucose-Capillary: 128 mg/dL — ABNORMAL HIGH (ref 70–99)
Glucose-Capillary: 62 mg/dL — ABNORMAL LOW (ref 70–99)
Glucose-Capillary: 195 mg/dL — ABNORMAL HIGH (ref 70–99)

## 2023-04-14 LAB — BASIC METABOLIC PANEL
Anion gap: 10 (ref 5–15)
BUN: 65 mg/dL — ABNORMAL HIGH (ref 8–23)
CO2: 22 mmol/L (ref 22–32)
Calcium: 8.2 mg/dL — ABNORMAL LOW (ref 8.9–10.3)
Chloride: 104 mmol/L (ref 98–111)
Creatinine, Ser: 3.13 mg/dL — ABNORMAL HIGH (ref 0.44–1.00)
GFR, Estimated: 14 mL/min — ABNORMAL LOW (ref >60)
Glucose, Bld: 93 mg/dL (ref 70–99)
Potassium: 4.8 mmol/L (ref 3.5–5.1)
Sodium: 136 mmol/L (ref 135–145)

## 2023-04-14 LAB — CBC
HCT: 28.4 % — ABNORMAL LOW (ref 36.0–46.0)
Hemoglobin: 8.8 g/dL — ABNORMAL LOW (ref 12.0–15.0)
MCH: 27.6 pg (ref 26.0–34.0)
MCHC: 31 g/dL (ref 30.0–36.0)
MCV: 89 fL (ref 80.0–100.0)
Platelets: 229 10*3/uL (ref 150–400)
RBC: 3.19 MIL/uL — ABNORMAL LOW (ref 3.87–5.11)
RDW: 14.2 % (ref 11.5–15.5)
WBC: 8.9 10*3/uL (ref 4.0–10.5)
nRBC: 0 % (ref 0.0–0.2)

## 2023-04-14 LAB — HEPATITIS B SURFACE ANTIGEN: Hepatitis B Surface Ag: NONREACTIVE

## 2023-04-14 LAB — HEPATITIS B CORE ANTIBODY, TOTAL: Hep B Core Total Ab: NONREACTIVE

## 2023-04-14 SURGERY — DIALYSIS/PERMA CATHETER INSERTION
Anesthesia: Moderate Sedation

## 2023-04-14 MED ORDER — CHLORHEXIDINE GLUCONATE CLOTH 2 % EX PADS
6.0000 | MEDICATED_PAD | Freq: Every day | CUTANEOUS | Status: DC
  Administered 2023-04-15 – 2023-04-20 (×6): 6 via TOPICAL

## 2023-04-14 MED ORDER — CHLORHEXIDINE GLUCONATE 4 % EX SOLN
60.0000 mL | Freq: Once | CUTANEOUS | Status: AC
  Administered 2023-04-14: 4 via TOPICAL

## 2023-04-14 MED ORDER — SODIUM CHLORIDE 0.9 % IV SOLN: INTRAVENOUS | Status: DC

## 2023-04-14 NOTE — Progress Notes (Addendum)
Advanced Heart Failure Team Progress Note   Primary Physician: Reubin Milan, MD PCP-Cardiologist:  None  Reason for Consultation: Acute systolic HF  Interval history  6/6 Switched to torsemide.  6/7 Off milrinone. Placed on oral amio.  6/8 Low dose coreg started.   Good diuresis with po torsemide yesterday.   Breathing better. No orthopnea or PND.   SCr 3.3 -> 3.1 BUN 65  Objective:    Vital Signs:   Temp:  [97.5 F (36.4 C)-98.2 F (36.8 C)] 98.2 F (36.8 C) (06/19 0849) Pulse Rate:  [52-58] 58 (06/19 0849) Resp:  [16-18] 18 (06/19 0849) BP: (121-146)/(47-81) 135/58 (06/19 0849) SpO2:  [94 %-100 %] 96 % (06/19 0849) Last BM Date : 04/12/23  Weight change: Filed Weights   04/08/23 0841 04/09/23 0730 04/13/23 0812  Weight: 58.4 kg 58.5 kg 58.6 kg    Intake/Output:   Intake/Output Summary (Last 24 hours) at 04/14/2023 0941 Last data filed at 04/14/2023 0600 Gross per 24 hour  Intake 584.83 ml  Output 1260 ml  Net -675.17 ml    Physical Exam    General:  Weak appearing. No resp difficulty HEENT: normal Neck: supple. JVP 7 Carotids 2+ bilat; no bruits. No lymphadenopathy or thryomegaly appreciated. Cor: PMI nondisplaced. Regular rate & rhythm 2/6 MR Lungs: clear Abdomen: soft, nontender, nondistended. No hepatosplenomegaly. No bruits or masses. Good bowel sounds. Extremities: no cyanosis, clubbing, rash, edema Neuro: alert & orientedx3, cranial nerves grossly intact. moves all 4 extremities w/o difficulty. Affect pleasant  Telemetry   SR 50-60s Personally reviewed  Labs   Basic Metabolic Panel: Recent Labs  Lab 04/09/23 1026 04/10/23 0529 04/11/23 0424 04/12/23 0348 04/13/23 0437 04/14/23 0801  NA 131* 133* 131* 131* 130* 136  K 4.6 4.8 4.8 4.9 4.5 4.8  CL 100 102 99 101 101 104  CO2 22 21* 21* 19* 18* 22  GLUCOSE 121* 86 123* 107* 108* 93  BUN 76* 80* 78* 78* 68* 65*  CREATININE 3.80* 3.80* 3.78* 3.64* 3.33* 3.13*  CALCIUM 8.3*  8.5* 8.5* 8.0* 8.2* 8.2*  MG 2.3 2.4 2.4 2.3  --   --   PHOS 4.9* 4.9* 5.0* 4.9*  --   --     CBC: Recent Labs  Lab 04/10/23 0529 04/11/23 0424 04/12/23 0348 04/13/23 0437 04/14/23 0801  WBC 8.3 9.7 7.8 8.3 8.9  HGB 8.5* 8.9* 8.1* 8.7* 8.8*  HCT 26.8* 28.8* 26.1* 27.7* 28.4*  MCV 88.7 89.4 88.8 88.5 89.0  PLT 208 243 207 235 229    BNP: BNP (last 3 results) Recent Labs    03/25/23 1001  BNP 971.3*    CBG: Recent Labs  Lab 04/13/23 0814 04/13/23 1222 04/13/23 1544 04/13/23 2103 04/14/23 0850  GLUCAP 91 190* 210* 185* 94    Imaging   No results found.   Medications:     Current Medications:  amiodarone  200 mg Oral BID   apixaban  2.5 mg Oral BID   arformoterol  15 mcg Nebulization BID   aspirin EC  81 mg Oral Daily   carvedilol  3.125 mg Oral BID WC   [START ON 04/15/2023] chlorhexidine  60 mL Topical Once   Chlorhexidine Gluconate Cloth  6 each Topical Daily   Chlorhexidine Gluconate Cloth  6 each Topical Q0600   cyanocobalamin  1,000 mcg Oral Daily   feeding supplement (NEPRO CARB STEADY)  237 mL Oral BID BM   gabapentin  100 mg Oral QHS   hydrALAZINE  10 mg Oral Q8H   insulin aspart  0-15 Units Subcutaneous TID WC   insulin aspart  0-5 Units Subcutaneous QHS   insulin aspart  2 Units Subcutaneous TID WC   insulin glargine-yfgn  8 Units Subcutaneous QHS   isosorbide mononitrate  30 mg Oral Daily   multivitamin  1 tablet Oral BID   nystatin   Topical TID   polyethylene glycol  17 g Oral Daily   promethazine  6.25 mg Oral QHS   revefenacin  175 mcg Nebulization Daily   rosuvastatin  10 mg Oral Once per day on Mon Wed Fri Sat   sodium bicarbonate  650 mg Oral BID    Infusions:  sodium chloride 10 mL/hr at 04/14/23 0600       Assessment/Plan   1. Acute systolic HF -> cardiogenic shock - Echo 2023 EF 55-60% - Echo this admit read as EF 30-35%. RV ok  -Per Dr Gala Romney EF 35-40% there is severe HK of basaliar to mid lateral wall and  inferolateral wall - High-sensitivity troponin 225 -> 458 - >1186 -> 1165.  BNP 971  - Suspect this is an ischemic CM  - Developed low output HF and elevated volume status. Initial co-ox 44% CVP 21 (03/29/23) - She has severe MR; only option for treatment is medical management.  - Continue hydra;/imdur and low-dose carvedilol - No ACE/ARB/ARNI or MRA with CKD IV - Volume status (and resp status) now much improved with torsemide 40mg  - would consider using torsemide 40mg  MWF + PRN  for now unless HD is being entertained   2. NSTEMI - echo as above with RWMA - no longer having CP - continue ASA/statin - With persistent renal failure and no current angina will defer cath   3. AKI on CKD 3a - likely ATN/cardiorenal  - Scr 1.5 -> 2.8 -> 2.7 ->->-> 3.78 -> 3.64 -> 3.3 -> 3.13 - 6/6 renal u/s - no hydronephrosis.  - renal following - plans for trial of HD noted. Will defer to Nephrology. I worry about her candidacy for long-term HD and seems to be stabilizing a bit  4. Acute hypoxic respiratory failure - likely due primarily to HF and pleural effusions on top of underlying COPD - s/p R thora on 6/3 with 700cc out  - 6/7 Bilateral pleural effsions.   5. PAF  - back in NSR now - Initially on amio drip. Now on amio po amio 200 mg twice a day.  - On eliquis 2.5 mg twice a day. Reduced dose with age and creatinine.   6. Hypervolemic hyponatremia - resolved  7. Urinary retention  8. DNR/DNI/no dialysis - confirmed with patient and her daughter  Rip Harbour for d/c from my standpoint on current meds. Will arrange f/u in HF Clinic  Cardiology will follow from a distance. Please call me with questions.   Length of Stay: 20  Arvilla Meres, MD  04/14/2023, 9:41 AM  Advanced Heart Failure Team Pager [phone] (M-F; 7a - 5p)  Please contact [facility] for night-coverage after hours (4p -7a ) and weekends on [email or web address]

## 2023-04-14 NOTE — TOC Progression Note (Signed)
Transition of Care [facility]) - Progression Note    Patient Details  Name: Jill Hudson MRN: [id number] Date of Birth: [birth year]  [facility]) CM/SW Contact  Garret Reddish, RN Phone Number: 04/14/2023, 12:47 PM  Clinical Narrative:   Chart reviewed.  Noted Foley catheter place with good urine output.  HD trail on hold at this time.  Patient start back on po torsemide MWF and prn per Cardiology.    Plan is for patient to discharge to [facility] when stable.  If patient will require outpatient dialysis  will need to update facility as to outpatient location to determine if they can still provide a bed offer.  HD trail on hold at this time.   TOC will continue to follow for discharge planning.      Expected Discharge Plan: Skilled Nursing Facility Barriers to Discharge: Continued Medical Work up  Expected Discharge Plan and Services       Living arrangements for the past 2 months: Single Family Home                                       Social Determinants of Health (SDOH) Interventions SDOH Screenings   Food Insecurity: No Food Insecurity (03/25/2023)  Housing: Low Risk  (03/25/2023)  Transportation Needs: No Transportation Needs (03/25/2023)  Utilities: Not At Risk (03/25/2023)  Alcohol Screen: Low Risk  (03/11/2022)  Depression (PHQ2-9): High Risk (01/01/2023)  Financial Resource Strain: Low Risk  (01/01/2023)  Physical Activity: Insufficiently Active (03/11/2022)  Social Connections: Socially Integrated (05/12/2022)  Stress: No Stress Concern Present (03/11/2022)  Tobacco Use: Medium Risk (03/25/2023)    Readmission Risk Interventions     No data to display

## 2023-04-14 NOTE — Progress Notes (Signed)
[facility]  - [place] at [facility]   PATIENT NAME: Jill Hudson    MR#:  [id number]  DATE OF BIRTH:  1935/07/03  SUBJECTIVE:   Patient seen earlier. Good UOP after foley placed HD trial placed on hold--resumed po torsemide per Cardiology MWF and prn  VITALS:  Blood pressure (!) 135/58, pulse (!) 58, temperature 98.2 F (36.8 C), resp. rate 18, height 5' (1.524 m), weight 58.6 kg, SpO2 96 %.  PHYSICAL EXAMINATION:   GENERAL:  88 y.o.-year-old patient with no acute distress. Weak, deconditioned LUNGS: Normal breath sounds bilaterally, no wheezing CARDIOVASCULAR: S1, S2 normal. No murmur   ABDOMEN: Soft, nontender, nondistended. Bowel sounds present.  EXTREMITIES: trace edema b/l.    NEUROLOGIC: nonfocal  patient is alert and awake, weak .   LABORATORY PANEL:  CBC Recent Labs  Lab 04/14/23 0801  WBC 8.9  HGB 8.8*  HCT 28.4*  PLT 229     Chemistries  Recent Labs  Lab 04/12/23 0348 04/13/23 0437 04/14/23 0801  NA 131*   < > 136  K 4.9   < > 4.8  CL 101   < > 104  CO2 19*   < > 22  GLUCOSE 107*   < > 93  BUN 78*   < > 65*  CREATININE 3.64*   < > 3.13*  CALCIUM 8.0*   < > 8.2*  MG 2.3  --   --    < > = values in this interval not displayed.    RADIOLOGY:  US RENAL  Result Date: 04/12/2023 CLINICAL DATA:  Urinary retention EXAM: RENAL / URINARY TRACT ULTRASOUND COMPLETE COMPARISON:  Renal ultrasound 04/01/2023 FINDINGS: Right Kidney: Renal measurements: 9.4 x 4.0 x 4.1 cm = volume: 80.4 mL. No collecting system dilatation or perinephric fluid. Likely small parapelvic renal cyst measuring 14 mm, similar to the previous when adjusting for technique. Left Kidney: Renal measurements: 10.7 x 4.4 x 4.2 cm = volume: 103.3 mL. No collecting system dilatation. Simple upper pole cyst measuring 2.8 cm. Previously 2.4 cm. Not significantly changed when adjusting for differences in technique. Bladder: Appears normal for degree of bladder distention. Other:  Ureteral jets are seen. Note is made of a pleural effusion on the right. Bladder volume 357 cc. IMPRESSION: No collecting system dilatation.  Bilateral renal cysts. Right-sided pleural effusion Electronically Signed   By: Karen Kays M.D.   On: 04/12/2023 18:37    Assessment and Plan  88 year old female with history of COPD, HTN, HLD, DM2, stroke, TIA, GERD, anxiety, PVD, CAD, MI, former tobacco use, CKD stage III, GI bleed, iron deficiency anemia, PAD status post PCI comes to the hospital with shortness of breath and hypoxia. Patient was found to have bilateral pulmonary edema /effusion with elevated BNP.      Assessment and Plan:  Cardiogenic shock [facility]) Cardiology took off milrinone drip 6/7.  Not on any pressors at this point.   Acute on chronic systolic CHF (congestive heart failure) (HCC) Acute respiratory failure with hypoxia (HCC) Ischemic cardiomyopathy --Patient initially requiring BiPAP secondary to respiratory distress.  S/p supplemental O2 inhalation, gradually weaned off currently on room air. --cont hydralazine, Imdur and low-dose Coreg.  No ACE/ARB or spironolactone with her worsening kidney function. --6/18--prn torsemide for now given worsening creat --6/19--seen by Cardiology--recommends torsemide 40 mg MWF and prn. HOld HD for now. Creat 3.13   Paroxysmal atrial fibrillation (HCC) --Patient converted to normal sinus rhythm.  Taken off amiodarone drip and switched to oral  on 6/7. --  Heparin drip converted over to Eliquis on 6/7. --Patient is on amiodarone 200 mg p.o. twice daily, continue for 1 month and then decrease dose as per cardiology.   Acute kidney injury superimposed on CKD (HCC) --AKI on CKD stage IIIa.  Creatinine now mid-3s --Nephrology Dr Cherylann Ratel discussed with patient's daughter and they are in agreement with hemodialysis. --Scr 1.5 -> 2.8 -> 2.7 ->->-> 3.78 -> 3.64 -> 3.3 -> 3.13  -- Vascular consultation placed --holding Perm cath placement for  now  Acute urinary retention Today patient reports that most of this hospitalization unable to urinate on her own, only with valsalva. Bladder scan today shows retention of 445 ml. Pt had in and out before and had a foley also eariler in the admission --Renal u/s no evidence of hydronephrosis. Shows bladder post void volume around 300. -- Patient unable to void. Bladder scan again today showed around 600 mL. Discussed with Dr. Richardo Hanks at present given no other option will place Foley catheter and have patient follow-up urology as outpatient for emptying study. --good UOP after Foley placement  NSTEMI (non-ST elevated myocardial infarction) (HCC) -- Continue aspirin, Imdur, Coreg and low-dose Crestor.   Type II diabetes mellitus with renal manifestations (HCC) --Last hemoglobin A1c 7.9.  Hypoglycemic episode the other morning.  Decreased Semglee insulin down to 8 units at night.   --Continue sliding scale.   COPD exacerbation (HCC) --Continue nebulizers.   Essential hypertension --Continue hydralazine, Imdur and low-dose Coreg.   History of CVA (cerebrovascular accident) --Patient on Eliquis for stroke prevention   HLD (hyperlipidemia) --On Crestor   chronic anemia Stable. Last hemoglobin 8.8.     Pleural effusion Right thoracentesis removed 700 mL of fluid, transudate.  This was done by interventional radiology on 6/3.   Body mass index is 25.19 kg/m.  Interventions:   Consults : palliative care, [facility] cardiology and nephrology    Diet: Heart healthy diet DVT Prophylaxis: Therapeutic Anticoagulation with Eliquis     Advance goals of care discussion: DNR   Family Communication: Nephrology to update dter today about holding HD       TOTAL TIME TAKING CARE OF THIS PATIENT: 35 minutes.  >50% time spent on counselling and coordination of care  Note: This dictation was prepared with Dragon dictation along with smaller phrase technology. Any transcriptional errors that  result from this process are unintentional.  Enedina Finner M.D    [facility]   CC: Primary care physician; Reubin Milan, MD

## 2023-04-14 NOTE — Progress Notes (Signed)
Physical Therapy Treatment Patient Details Name: Jill Hudson MRN: [id number] DOB: 11/09/1934 Today's Date: 04/14/2023   History of Present Illness Jill Hudson is a 88yoF who presented to ED secondary to progressive SOB; admitted for management of A/C CHF exacerbation, AECOPD.  s/p thoracentesis (6/3) with removal of fluid; pending cardiac cath and TEE.    PT Comments    Pt received up in recliner, c/o fatigue and requesting to return to bed. Pt required MinA to stand from chair to RW, MinA provided for balance and safety while taking shuffling steps to bedside. Upon sitting, pt required ~3 minutes rest at EOB to recover from DOE despite SpO2 at 92% on RA. Pt assisted back to supine with ModA to raise edematous B LE's up onto bed. Pt progressing slowly with skilled PT, will continue per POC.   Recommendations for follow up therapy are one component of a multi-disciplinary discharge planning process, led by the attending physician.  Recommendations may be updated based on patient status, additional functional criteria and insurance authorization.  Follow Up Recommendations  Can patient physically be transported by private vehicle: No    Assistance Recommended at Discharge Frequent or constant Supervision/Assistance  Patient can return home with the following A lot of help with walking and/or transfers;A little help with bathing/dressing/bathroom;Assist for transportation   Equipment Recommendations  Rolling walker (2 wheels)    Recommendations for Other Services       Precautions / Restrictions Precautions Precautions: Fall Restrictions Weight Bearing Restrictions: No     Mobility  Bed Mobility Overal bed mobility: Needs Assistance Bed Mobility: Sit to Supine     Supine to sit:  (for B LE's) Sit to supine: Mod assist, HOB elevated (assist for B LE's)        Transfers Overall transfer level: Needs assistance Equipment used: Rolling walker (2 wheels) Transfers: Sit  to/from Stand Sit to Stand: Min assist           General transfer comment: lifting and lowering assistance provided. verbal cues for hand placement and anterior weight shifting    Ambulation/Gait Ambulation/Gait assistance: Min assist Gait Distance (Feet): 2 Feet Assistive device: Rolling walker (2 wheels) Gait Pattern/deviations: Step-to pattern Gait velocity: decreased     General Gait Details: side steps performed to the right. standing activity tolerance limited for further progression of activity   Stairs             Wheelchair Mobility    Modified Rankin (Stroke Patients Only)       Balance Overall balance assessment: Needs assistance Sitting-balance support: Feet supported Sitting balance-Leahy Scale: Good Sitting balance - Comments: steady sitting, reaching inside BOS. Able to maintain static sitting balance during ~20 min of ADL management during session.   Standing balance support: Reliant on assistive device for balance, Bilateral upper extremity supported Standing balance-Leahy Scale: Fair                              Cognition Arousal/Alertness: Awake/alert Behavior During Therapy: WFL for tasks assessed/performed Overall Cognitive Status: Within Functional Limits for tasks assessed                                          Exercises General Exercises - Lower Extremity Ankle Circles/Pumps: AROM, Both, 10 reps, Supine Long Arc Quad: AROM, Both, 5 reps, Seated  General Comments        Pertinent Vitals/Pain Pain Assessment Pain Assessment: 0-10 Pain Score: 2  Pain Location: bottom Pain Descriptors / Indicators: Discomfort, Sore Pain Intervention(s): Repositioned    Home Living                          Prior Function            PT Goals (current goals can now be found in the care plan section) Acute Rehab PT Goals Patient Stated Goal: to go home    Frequency    Min 3X/week       PT Plan Current plan remains appropriate    Co-evaluation              AM-PAC PT "6 Clicks" Mobility   Outcome Measure  Help needed turning from your back to your side while in a flat bed without using bedrails?: A Lot Help needed moving from lying on your back to sitting on the side of a flat bed without using bedrails?: A Lot Help needed moving to and from a bed to a chair (including a wheelchair)?: A Lot Help needed standing up from a chair using your arms (e.g., wheelchair or bedside chair)?: A Little Help needed to walk in hospital room?: A Little Help needed climbing 3-5 steps with a railing? : A Lot 6 Click Score: 14    End of Session Equipment Utilized During Treatment: Gait belt Activity Tolerance: Patient tolerated treatment well;Patient limited by fatigue Patient left: in bed;with call bell/phone within reach;with bed alarm set;with nursing/sitter in room Nurse Communication: Mobility status PT Visit Diagnosis: Muscle weakness (generalized) (M62.81);Difficulty in walking, not elsewhere classified (R26.2)     Time: 1421-1440 PT Time Calculation (min) (ACUTE ONLY): 19 min  Charges:  $Therapeutic Activity: 8-22 mins                    Zadie Cleverly, PTA  Jannet Askew 04/14/2023, 4:42 PM

## 2023-04-14 NOTE — Progress Notes (Signed)
Occupational Therapy Treatment Patient Details Name: Jill Hudson MRN: [id number] DOB: [birth year] Today's Date: 04/14/2023   History of present illness Jill Hudson is a 88yoF who presented to ED secondary to progressive SOB; admitted for management of A/C CHF exacerbation, AECOPD.  s/p thoracentesis (6/3) with removal of fluid; pending cardiac cath and TEE.   OT comments  Jill Hudson was seen for OT treatment on this date. Upon arrival to room pt sleeping soundly in chair, wakes to VCs, endorsing fatigue, but agreeable to OT Tx session. OT facilitated ADL management as described below. See ADL section for additional details regarding occupational performance. Pt continues to be functionally limited by generalized weakness, increased dyspnea with exertion, and decreased activity tolerance. SpO2 monitored t/o session and remains WFL 89-90% with pt on RA. Educated on energy conservation and safety t/o session. Pt return verbalizes understanding of education provided t/o session. Pt is progressing toward OT goals and continues to benefit from skilled OT services to maximize return to PLOF and minimize risk of future falls, injury, caregiver burden, and readmission. Will continue to follow POC as written. Discharge recommendation remains appropriate     Recommendations for follow up therapy are one component of a multi-disciplinary discharge planning process, led by the attending physician.  Recommendations may be updated based on patient status, additional functional criteria and insurance authorization.    Assistance Recommended at Discharge Intermittent Supervision/Assistance  Patient can return home with the following  A little help with walking and/or transfers;A little help with bathing/dressing/bathroom;Assistance with cooking/housework;Assist for transportation;Help with stairs or ramp for entrance   Equipment Recommendations  BSC/3in1    Recommendations for Other Services       Precautions / Restrictions Precautions Precautions: Fall Restrictions Weight Bearing Restrictions: No       Mobility Bed Mobility Overal bed mobility: Needs Assistance             General bed mobility comments: deferred. Pt in recliner at start/end of session.    Transfers Overall transfer level: Needs assistance Equipment used: Rolling walker (2 wheels) Transfers: Sit to/from Stand Sit to Stand: Min assist           General transfer comment: lifting and lowering assistance provided. verbal cues for hand placement and anterior weight shifting     Balance Overall balance assessment: Needs assistance Sitting-balance support: Feet supported Sitting balance-Leahy Scale: Good     Standing balance support: Reliant on assistive device for balance, Bilateral upper extremity supported Standing balance-Leahy Scale: Fair Standing balance comment: Able to perform functional mobility with heavy UE support on RW but no physical assist.                           ADL either performed or assessed with clinical judgement   ADL Overall ADL's : Needs assistance/impaired Eating/Feeding: Set up;Sitting;Cueing for sequencing Eating/Feeding Details (indicate cue type and reason): Cueing to attend to task                                 Functional mobility during ADLs: Rolling walker (2 wheels);Min guard;Cueing for safety General ADL Comments: Reinforcement of prior education on energy conservation strategies t/o ADL management. Pt is able to return demo understanding of education with min cueing.    Extremity/Trunk Assessment Upper Extremity Assessment Upper Extremity Assessment: Generalized weakness   Lower Extremity Assessment Lower Extremity Assessment: Generalized weakness  Vision Baseline Vision/History: 6 Macular Degeneration Patient Visual Report: Blurring of vision Additional Comments: Pt reports increasing blurry vision. Believes this  is 2/2 not being able to follow up with her eye doctor for injections 2/2 prolonged hospital stay.   Perception     Praxis      Cognition Arousal/Alertness: Lethargic Behavior During Therapy: WFL for tasks assessed/performed Overall Cognitive Status: Within Functional Limits for tasks assessed                                 General Comments: Increased lethargy today, pt noted to drift off during session but is easily re-directed to task/topic at hand. Pt reports feeling more tired than she has recently.        Exercises Other Exercises Other Exercises: Ongoing education on safety, falls prevention, and energy conservation strategies t/o ADL management as described above.    Shoulder Instructions       General Comments      Pertinent Vitals/ Pain       Pain Assessment Pain Assessment: No/denies pain  Home Living                                          Prior Functioning/Environment              Frequency  Min 2X/week        Progress Toward Goals  OT Goals(current goals can now be found in the care plan section)  Progress towards OT goals: Progressing toward goals  Acute Rehab OT Goals Patient Stated Goal: To catch my breath and be able to take care of myself. OT Goal Formulation: With patient Time For Goal Achievement: 04/26/23 Potential to Achieve Goals: Good  Plan Frequency remains appropriate;Discharge plan remains appropriate    Co-evaluation                 AM-PAC OT "6 Clicks" Daily Activity     Outcome Measure   Help from another person eating meals?: None Help from another person taking care of personal grooming?: A Little Help from another person toileting, which includes using toliet, bedpan, or urinal?: A Little Help from another person bathing (including washing, rinsing, drying)?: A Lot Help from another person to put on and taking off regular upper body clothing?: A Little Help from another  person to put on and taking off regular lower body clothing?: A Lot 6 Click Score: 17    End of Session Equipment Utilized During Treatment: Rolling walker (2 wheels);Gait belt  OT Visit Diagnosis: Unsteadiness on feet (R26.81);Muscle weakness (generalized) (M62.81);History of falling (Z91.81)   Activity Tolerance Patient tolerated treatment well   Patient Left in bed;with call bell/phone within reach;with bed alarm set   Nurse Communication Mobility status        Time: 1057-1110 OT Time Calculation (min): 13 min  Charges: OT General Charges $OT Visit: 1 Visit OT Treatments $Self Care/Home Management : 8-22 mins  Rockney Ghee, M.S., OTR/L 04/14/23, 12:19 PM

## 2023-04-14 NOTE — Progress Notes (Signed)
[facility]  ROUNDING NOTE   Subjective:   [name] was admitted to [facility] on 03/25/2023 for COPD exacerbation [facility]) [J44.1] Chest pain [R07.9] Community acquired pneumonia, unspecified laterality [J18.9]  Patient sitting up in chair Daughter at bedside  Room air No lower extremity edema  UOP 1.26L Creatinine 3.13  Objective:  Vital signs in last 24 hours:  Temp:  [97.8 F (36.6 C)-98.2 F (36.8 C)] 97.8 F (36.6 C) (06/19 1238) Pulse Rate:  [52-58] 52 (06/19 1238) Resp:  [16-18] 18 (06/19 1238) BP: (116-146)/(47-81) 116/55 (06/19 1238) SpO2:  [94 %-100 %] 97 % (06/19 1238)  Weight change:  Filed Weights   04/08/23 0841 04/09/23 0730 04/13/23 0812  Weight: 58.4 kg 58.5 kg 58.6 kg    Intake/Output: I/O last 3 completed shifts: In: 1064.8 [P.O.:1020; I.V.:44.8] Out: 1260 [Urine:1260]   Intake/Output this shift:  Total I/O In: 41.4 [I.V.:41.4] Out: 300 [Urine:300]  Physical Exam: General: NAD, laying in bed  Head: Normocephalic, atraumatic. Moist oral mucosal membranes  Eyes: Anicteric  Lungs:  Diminished in bases, room air  Heart: regular  Abdomen:  Soft, nontender  Extremities:  trace peripheral edema.  Neurologic: Alert and oriented to self, moving all four extremities  Skin: warm        Basic Metabolic Panel: Recent Labs  Lab 04/09/23 1026 04/10/23 0529 04/11/23 0424 04/12/23 0348 04/13/23 0437 04/14/23 0801  NA 131* 133* 131* 131* 130* 136  K 4.6 4.8 4.8 4.9 4.5 4.8  CL 100 102 99 101 101 104  CO2 22 21* 21* 19* 18* 22  GLUCOSE 121* 86 123* 107* 108* 93  BUN 76* 80* 78* 78* 68* 65*  CREATININE 3.80* 3.80* 3.78* 3.64* 3.33* 3.13*  CALCIUM 8.3* 8.5* 8.5* 8.0* 8.2* 8.2*  MG 2.3 2.4 2.4 2.3  --   --   PHOS 4.9* 4.9* 5.0* 4.9*  --   --      Liver Function Tests: No results for input(s): "AST", "ALT", "ALKPHOS", "BILITOT", "PROT", "ALBUMIN" in the last 168 hours.  No results for input(s): "LIPASE", "AMYLASE" in the  last 168 hours. No results for input(s): "AMMONIA" in the last 168 hours.  CBC: Recent Labs  Lab 04/10/23 0529 04/11/23 0424 04/12/23 0348 04/13/23 0437 04/14/23 0801  WBC 8.3 9.7 7.8 8.3 8.9  HGB 8.5* 8.9* 8.1* 8.7* 8.8*  HCT 26.8* 28.8* 26.1* 27.7* 28.4*  MCV 88.7 89.4 88.8 88.5 89.0  PLT 208 243 207 235 229     Cardiac Enzymes: No results for input(s): "CKTOTAL", "CKMB", "CKMBINDEX", "TROPONINI" in the last 168 hours.  BNP: Invalid input(s): "POCBNP"  CBG: Recent Labs  Lab 04/13/23 1222 04/13/23 1544 04/13/23 2103 04/14/23 0850 04/14/23 1251  GLUCAP 190* 210* 185* 94 128*     Microbiology: Results for orders placed or performed during the hospital encounter of 03/25/23  Blood Culture (routine x 2)     Status: None   Collection Time: 03/25/23 12:09 PM   Specimen: BLOOD  Result Value Ref Range Status   Specimen Description BLOOD LEFT [facility]  Final   Special Requests   Final    BOTTLES DRAWN AEROBIC AND ANAEROBIC Blood Culture adequate volume   Culture   Final    NO GROWTH 5 DAYS Performed at [facility], [address], [place], [place] [address]    Report Status 03/30/2023 FINAL  Final  Blood Culture (routine x 2)     Status: None   Collection Time: 03/25/23 12:10 PM  Specimen: BLOOD  Result Value Ref Range Status   Specimen Description BLOOD LEFT FA  Final   Special Requests   Final    BOTTLES DRAWN AEROBIC AND ANAEROBIC Blood Culture adequate volume   Culture   Final    NO GROWTH 5 DAYS Performed at [facility], [address] [place], [place] [address]    Report Status 03/30/2023 FINAL  Final  SARS Coronavirus 2 by RT PCR (hospital order, performed in [facility] hospital lab) *cepheid single result test* Anterior Nasal Swab     Status: None   Collection Time: 03/25/23  1:18 PM   Specimen: Anterior Nasal Swab  Result Value Ref Range Status   SARS Coronavirus 2 by RT PCR NEGATIVE NEGATIVE Final    Comment: (NOTE) SARS-CoV-2  target nucleic acids are NOT DETECTED.  The SARS-CoV-2 RNA is generally detectable in upper and lower respiratory specimens during the acute phase of infection. The lowest concentration of SARS-CoV-2 viral copies this assay can detect is 250 copies / mL. A negative result does not preclude SARS-CoV-2 infection and should not be used as the sole basis for treatment or other patient management decisions.  A negative result may occur with improper specimen collection / handling, submission of specimen other than nasopharyngeal swab, presence of viral mutation(s) within the areas targeted by this assay, and inadequate number of viral copies (<250 copies / mL). A negative result must be combined with clinical observations, patient history, and epidemiological information.  Fact Sheet for Patients:   RoadLapTop.co.za  Fact Sheet for Healthcare Providers: http://kim-miller.com/  This test is not yet approved or  cleared by the [place] FDA and has been authorized for detection and/or diagnosis of SARS-CoV-2 by FDA under an Emergency Use Authorization (EUA).  This EUA will remain in effect (meaning this test can be used) for the duration of the COVID-19 declaration under Section 564(b)(1) of the Act, 21 U.S.C. section 360bbb-3(b)(1), unless the authorization is terminated or revoked sooner.  Performed at [facility], [address] [place], [place] [address]   MRSA Next Gen by PCR, Nasal     Status: None   Collection Time: 03/27/23  4:40 AM   Specimen: Nasal Mucosa; Nasal Swab  Result Value Ref Range Status   MRSA by PCR Next Gen NOT DETECTED NOT DETECTED Final    Comment: (NOTE) The GeneXpert MRSA Assay (FDA approved for NASAL specimens only), is one component of a comprehensive MRSA colonization surveillance program. It is not intended to diagnose MRSA infection nor to guide or monitor treatment for MRSA infections. Test  performance is not FDA approved in patients less than 43 years old. Performed at [facility], [address] [place], [place] [address]   Respiratory (~20 pathogens) panel by PCR     Status: None   Collection Time: 03/28/23  4:00 PM   Specimen: Nasopharyngeal Swab; Respiratory  Result Value Ref Range Status   Adenovirus NOT DETECTED NOT DETECTED Final   Coronavirus 229E NOT DETECTED NOT DETECTED Final    Comment: (NOTE) The Coronavirus on the Respiratory Panel, DOES NOT test for the novel  Coronavirus (2019 nCoV)    Coronavirus HKU1 NOT DETECTED NOT DETECTED Final   Coronavirus NL63 NOT DETECTED NOT DETECTED Final   Coronavirus OC43 NOT DETECTED NOT DETECTED Final   Metapneumovirus NOT DETECTED NOT DETECTED Final   Rhinovirus / Enterovirus NOT DETECTED NOT DETECTED Final   Influenza A NOT DETECTED NOT DETECTED Final   Influenza B  NOT DETECTED NOT DETECTED Final   Parainfluenza Virus 1 NOT DETECTED NOT DETECTED Final   Parainfluenza Virus 2 NOT DETECTED NOT DETECTED Final   Parainfluenza Virus 3 NOT DETECTED NOT DETECTED Final   Parainfluenza Virus 4 NOT DETECTED NOT DETECTED Final   Respiratory Syncytial Virus NOT DETECTED NOT DETECTED Final   Bordetella pertussis NOT DETECTED NOT DETECTED Final   Bordetella Parapertussis NOT DETECTED NOT DETECTED Final   Chlamydophila pneumoniae NOT DETECTED NOT DETECTED Final   Mycoplasma pneumoniae NOT DETECTED NOT DETECTED Final    Comment: Performed at [facility], [address], [place], [place] [address]  Body fluid culture w Gram Stain     Status: None   Collection Time: 03/29/23 10:27 AM   Specimen: PATH Cytology Pleural fluid  Result Value Ref Range Status   Specimen Description   Final    PLEURAL Performed at [facility], [address] [place], [place] [address]    Special Requests   Final    NONE Performed at [facility], [address] [place], [place] [address]    Gram Stain NO WBC  SEEN NO ORGANISMS SEEN   Final   Culture   Final    NO GROWTH 3 DAYS Performed at [facility], [address] [place], [place], [place] [address]    Report Status 04/01/2023 FINAL  Final  MRSA Next Gen by PCR, Nasal     Status: None   Collection Time: 03/29/23  3:00 PM   Specimen: Nasal Mucosa; Nasal Swab  Result Value Ref Range Status   MRSA by PCR Next Gen NOT DETECTED NOT DETECTED Final    Comment: (NOTE) The GeneXpert MRSA Assay (FDA approved for NASAL specimens only), is one component of a comprehensive MRSA colonization surveillance program. It is not intended to diagnose MRSA infection nor to guide or monitor treatment for MRSA infections. Test performance is not FDA approved in patients less than 21 years old. Performed at [facility], [address] [place], [place] [address]   Group A Strep by PCR     Status: None   Collection Time: 03/30/23  6:00 PM   Specimen: Throat; Sterile Swab  Result Value Ref Range Status   Group A Strep by PCR NOT DETECTED NOT DETECTED Final    Comment: Performed at [facility], [address] [place], [place] [address]    Coagulation Studies: No results for input(s): "LABPROT", "INR" in the last 72 hours.   Urinalysis: No results for input(s): "COLORURINE", "LABSPEC", "PHURINE", "GLUCOSEU", "HGBUR", "BILIRUBINUR", "KETONESUR", "PROTEINUR", "UROBILINOGEN", "NITRITE", "LEUKOCYTESUR" in the last 72 hours.  Invalid input(s): "APPERANCEUR"    Imaging: US RENAL  Result Date: 04/12/2023 CLINICAL DATA:  Urinary retention EXAM: RENAL / URINARY TRACT ULTRASOUND COMPLETE COMPARISON:  Renal ultrasound 04/01/2023 FINDINGS: Right Kidney: Renal measurements: 9.4 x 4.0 x 4.1 cm = volume: 80.4 mL. No collecting system dilatation or perinephric fluid. Likely small parapelvic renal cyst measuring 14 mm, similar to the previous when adjusting for technique. Left Kidney: Renal measurements: 10.7 x 4.4 x 4.2 cm = volume: 103.3 mL. No  collecting system dilatation. Simple upper pole cyst measuring 2.8 cm. Previously 2.4 cm. Not significantly changed when adjusting for differences in technique. Bladder: Appears normal for degree of bladder distention. Other: Ureteral jets are seen. Note is made of a pleural effusion on the right. Bladder volume 357 cc. IMPRESSION: No collecting system dilatation.  Bilateral renal cysts. Right-sided pleural effusion Electronically Signed  By: Karen Kays M.D.   On: 04/12/2023 18:37     Medications:    sodium chloride Stopped (04/14/23 1008)     amiodarone  200 mg Oral BID   apixaban  2.5 mg Oral BID   arformoterol  15 mcg Nebulization BID   aspirin EC  81 mg Oral Daily   carvedilol  3.125 mg Oral BID WC   [START ON 04/15/2023] chlorhexidine  60 mL Topical Once   Chlorhexidine Gluconate Cloth  6 each Topical Daily   Chlorhexidine Gluconate Cloth  6 each Topical Q0600   cyanocobalamin  1,000 mcg Oral Daily   feeding supplement (NEPRO CARB STEADY)  237 mL Oral BID BM   gabapentin  100 mg Oral QHS   hydrALAZINE  10 mg Oral Q8H   insulin aspart  0-15 Units Subcutaneous TID WC   insulin aspart  0-5 Units Subcutaneous QHS   insulin aspart  2 Units Subcutaneous TID WC   insulin glargine-yfgn  8 Units Subcutaneous QHS   isosorbide mononitrate  30 mg Oral Daily   multivitamin  1 tablet Oral BID   nystatin   Topical TID   polyethylene glycol  17 g Oral Daily   promethazine  6.25 mg Oral QHS   revefenacin  175 mcg Nebulization Daily   rosuvastatin  10 mg Oral Once per day on Mon Wed Fri Sat   sodium bicarbonate  650 mg Oral BID   acetaminophen, ALPRAZolam, alum & mag hydroxide-simeth, dextromethorphan-guaiFENesin, hydrALAZINE, levalbuterol, melatonin, ondansetron (ZOFRAN) IV, mouth rinse, oxyCODONE, phenol, senna-docusate, torsemide, traZODone  Assessment/ Plan:  [name] is a 88 y.o.  female with diabetes mellitus type II, hypertension, coronary artery disease, peripheral arterial  disease, tremor, CVA, congestive heart failure , anxiety, peripheral arterial diease and anemia who is admitted to [facility] on 03/25/2023 for COPD exacerbation (HCC) [J44.1] Chest pain [R07.9] Community acquired pneumonia, unspecified laterality [J18.9]  Acute kidney injury with acute metabolic acidosis on chronic kidney disease stage IIIB: baseline creatinine of 1.48, GFR of 34 on 12/09/2022. Acute kidney injury secondary to acute cardiorenal syndrome . Chronic kidney disease secondary to hypertension.  Creatinine slowly improving. After discussing this patient with Cardiology, we will hold dialysis for now and allow for diuresis. Will continue to monitor renal function.    Hypertension with chronic kidney disease: with chronic systolic and diastolic congestive heart failure.  Appreciate cardiology input.  Heart failure regimen includes isosorbide/hydralazine and carvedilol Blood pressure remains stable Torsemide 40mg  ordered PRN   Hyponatremia: Sodium corrected to 136  Encouraged to increase oral intake  4.  Hypokalemia Potassium corrected     LOS: 20   6/19/20244:04 PM

## 2023-04-14 NOTE — Progress Notes (Signed)
Daily Progress Note   Patient Name: Jill Hudson       Date: 04/14/2023 DOB: Feb 14, 1935  Age: 88 y.o. MRN#: [id number] Attending Physician: Enedina Finner, MD Primary Care Physician: Reubin Milan, MD Admit Date: 03/25/2023  Reason for Consultation/Follow-up: Establishing goals of care  Subjective: In to see patient.  Daughter is at bedside.  Discussed prospect of potential dialysis.  Discussed patient's cardiac and renal status.  Patient and daughter are grateful that patient's creatinine has stabilized at this time and currently she does not need dialysis.  Discussed acceptable quality of life.  They will continue to consider their wishes on this moving forward, as they are unsure about desiring this in the future.  Length of Stay: 20  Current Medications: Scheduled Meds:   amiodarone  200 mg Oral BID   apixaban  2.5 mg Oral BID   arformoterol  15 mcg Nebulization BID   aspirin EC  81 mg Oral Daily   carvedilol  3.125 mg Oral BID WC   [START ON 04/15/2023] chlorhexidine  60 mL Topical Once   Chlorhexidine Gluconate Cloth  6 each Topical Daily   Chlorhexidine Gluconate Cloth  6 each Topical Q0600   cyanocobalamin  1,000 mcg Oral Daily   feeding supplement (NEPRO CARB STEADY)  237 mL Oral BID BM   gabapentin  100 mg Oral QHS   hydrALAZINE  10 mg Oral Q8H   insulin aspart  0-15 Units Subcutaneous TID WC   insulin aspart  0-5 Units Subcutaneous QHS   insulin aspart  2 Units Subcutaneous TID WC   insulin glargine-yfgn  8 Units Subcutaneous QHS   isosorbide mononitrate  30 mg Oral Daily   multivitamin  1 tablet Oral BID   nystatin   Topical TID   polyethylene glycol  17 g Oral Daily   promethazine  6.25 mg Oral QHS   revefenacin  175 mcg Nebulization Daily   rosuvastatin  10 mg Oral  Once per day on Mon Wed Fri Sat   sodium bicarbonate  650 mg Oral BID    Continuous Infusions:  sodium chloride Stopped (04/14/23 1008)    PRN Meds: acetaminophen, ALPRAZolam, alum & mag hydroxide-simeth, dextromethorphan-guaiFENesin, hydrALAZINE, levalbuterol, melatonin, ondansetron (ZOFRAN) IV, mouth rinse, oxyCODONE, phenol, senna-docusate, torsemide, traZODone  Physical Exam Pulmonary:     Effort:  Pulmonary effort is normal.  Neurological:     Mental Status: She is alert.             Vital Signs: BP (!) 116/55 (BP Location: Left Arm)   Pulse (!) 52   Temp 97.8 F (36.6 C)   Resp 18   Ht 5' (1.524 m)   Wt 58.6 kg   SpO2 97%   BMI 25.23 kg/m  SpO2: SpO2: 97 % O2 Device: O2 Device: Room Air O2 Flow Rate: O2 Flow Rate (L/min): 2 L/min  Intake/output summary:  Intake/Output Summary (Last 24 hours) at 04/14/2023 1608 Last data filed at 04/14/2023 1400 Gross per 24 hour  Intake 386.22 ml  Output 1550 ml  Net -1163.78 ml   LBM: Last BM Date : 04/12/23 Baseline Weight: Weight: 56.2 kg Most recent weight: Weight: 58.6 kg      Patient Active Problem List   Diagnosis Date Noted   Acute urinary retention 04/04/2023   Community acquired pneumonia 04/03/2023   Hyperkalemia 04/03/2023   Pleural effusion 04/02/2023   Acute on chronic systolic CHF (congestive heart failure) (HCC) 03/31/2023   Paroxysmal atrial fibrillation (HCC) 03/31/2023   Hypokalemia 03/31/2023   Cardiogenic shock (HCC) 03/28/2023   COPD exacerbation (HCC) 03/25/2023   HLD (hyperlipidemia) 03/25/2023   Type II diabetes mellitus with renal manifestations (HCC) 03/25/2023   CAD (coronary artery disease) 03/25/2023   Iron deficiency anemia 03/25/2023   PAD (peripheral artery disease) (HCC) 03/25/2023   Acquired thrombophilia (HCC) 03/20/2021   Mood disorder (HCC) 05/02/2020   Chronic heart failure with preserved ejection fraction (HCC) 04/12/2020   Diverticulosis of large intestine without  perforation or abscess with bleeding 03/27/2020   COPD (chronic obstructive pulmonary disease) with chronic bronchitis    NSTEMI (non-ST elevated myocardial infarction) (HCC)    Acute respiratory failure with hypoxia (HCC) 02/18/2020   CKD (chronic kidney disease), stage IIIa 02/18/2020   Hyponatremia 02/18/2020   Malnutrition of mild degree (HCC) 01/08/2020   Acute kidney injury superimposed on CKD (HCC)    Age-related macular degeneration, dry, left eye 06/21/2019   Age-related macular degeneration, wet, right eye (HCC) 06/21/2019   Moderate nonproliferative diabetic retinopathy associated with type 2 diabetes mellitus (HCC) 06/21/2019   Lumbosacral radiculopathy at L4 05/01/2019   Underweight 05/01/2019   Encounter for long-term (current) use of aspirin 10/31/2018   Long term current use of oral hypoglycemic drug 10/31/2018   Encounter for long-term (current) use of insulin (HCC) 10/31/2018   Peptic ulcer disease 08/11/2018   Leg pain 07/03/2017   Carpal tunnel syndrome on both sides 05/05/2017   Myalgia due to HMG CoA reductase inhibitor 05/05/2017   History of CVA (cerebrovascular accident) 02/15/2017   Degenerative disc disease, lumbar 12/21/2016   Atherosclerosis of native arteries of extremity with intermittent claudication (HCC) 12/20/2016   Bilateral carotid artery stenosis 12/20/2016   Occlusion and stenosis of bilateral carotid arteries 12/20/2016   Hip bursitis 05/18/2016   Elevated TSH 01/18/2016   CKD stage 3 due to type 2 diabetes mellitus (HCC) 01/16/2016   Senile ecchymosis 01/16/2016   Type II diabetes mellitus with complication (HCC)    GERD (gastroesophageal reflux disease) 07/24/2015   PVD (peripheral vascular disease) (HCC) 05/17/2015   Neoplasm of uncertain behavior of skin 05/17/2015   Retinopathy, diabetic, proliferative (HCC) 04/11/2015   Hyperlipidemia associated with type 2 diabetes mellitus (HCC) 04/11/2015   Essential hypertension 04/11/2015    Generalized OA 04/11/2015   Proliferative diabetic retinopathy(362.02) 04/11/2015   Arteriosclerosis  of coronary artery 05/29/2013   Hypertensive heart disease without CHF 05/29/2013    Palliative Care Assessment & Plan    Recommendations/Plan: Patient and daughter are considering if they would ever want dialysis given patient's age, cardiac and renal status.  Code Status:    Code Status Orders  (From admission, onward)           Start     Ordered   03/31/23 1502  Do not attempt resuscitation (DNR)  Continuous       Question Answer Comment  If patient has no pulse and is not breathing Do Not Attempt Resuscitation   If patient has a pulse and/or is breathing: Medical Treatment Goals LIMITED ADDITIONAL INTERVENTIONS: Use medication/IV fluids and cardiac monitoring as indicated; Do not use intubation or mechanical ventilation (DNI), also provide comfort medications.  Transfer to Progressive/Stepdown as indicated, avoid Intensive Care.   Consent: Discussion documented in EHR or advanced directives reviewed      03/31/23 1501           Code Status History     Date Active Date Inactive Code Status Order ID Comments User Context   03/25/2023 1214 03/31/2023 1501 Full Code [id number]  Lorretta Harp, MD ED   02/18/2020 1059 02/26/2020 2310 Full Code [id number]  Lorretta Harp, MD ED   12/25/2019 0555 12/28/2019 2204 Full Code [id number]  Mansy, Vernetta Honey, MD ED   10/12/2018 1647 10/15/2018 1808 Full Code [id number]  Schnier, Latina Craver, MD Inpatient   09/16/2018 1254 09/16/2018 2048 Full Code [id number]  Gilda Crease, Latina Craver, MD Inpatient   08/11/2018 1706 08/13/2018 2136 Full Code [id number]  Bertrum Sol, MD Inpatient   07/26/2018 0921 07/26/2018 1652 Full Code [id number]  Renford Dills, MD Inpatient   08/23/2017 0935 08/23/2017 1419 Full Code [id number]  Annice Needy, MD Inpatient   02/15/2017 1538 02/16/2017 1959 Full Code [id number]  Auburn Bilberry, MD Inpatient       Prognosis: Poor  overall   Thank you for allowing the Palliative Medicine Team to assist in the care of this patient.   Morton Stall, NP  Please contact Palliative Medicine Team phone at [phone] for questions and concerns.

## 2023-04-15 ENCOUNTER — Telehealth: Payer: Self-pay | Admitting: Urology

## 2023-04-15 DIAGNOSIS — N189 Chronic kidney disease, unspecified: Secondary | ICD-10-CM | POA: Diagnosis not present

## 2023-04-15 DIAGNOSIS — I214 Non-ST elevation (NSTEMI) myocardial infarction: Secondary | ICD-10-CM | POA: Diagnosis not present

## 2023-04-15 DIAGNOSIS — I5023 Acute on chronic systolic (congestive) heart failure: Secondary | ICD-10-CM | POA: Diagnosis not present

## 2023-04-15 DIAGNOSIS — R570 Cardiogenic shock: Secondary | ICD-10-CM | POA: Diagnosis not present

## 2023-04-15 DIAGNOSIS — Z7189 Other specified counseling: Secondary | ICD-10-CM | POA: Diagnosis not present

## 2023-04-15 DIAGNOSIS — Z66 Do not resuscitate: Secondary | ICD-10-CM

## 2023-04-15 DIAGNOSIS — N179 Acute kidney failure, unspecified: Secondary | ICD-10-CM | POA: Diagnosis not present

## 2023-04-15 LAB — RENAL FUNCTION PANEL
Albumin: 2.7 g/dL — ABNORMAL LOW (ref 3.5–5.0)
Anion gap: 9 (ref 5–15)
BUN: 63 mg/dL — ABNORMAL HIGH (ref 8–23)
CO2: 22 mmol/L (ref 22–32)
Calcium: 8.2 mg/dL — ABNORMAL LOW (ref 8.9–10.3)
Chloride: 103 mmol/L (ref 98–111)
Creatinine, Ser: 3 mg/dL — ABNORMAL HIGH (ref 0.44–1.00)
GFR, Estimated: 15 mL/min — ABNORMAL LOW (ref >60)
Glucose, Bld: 143 mg/dL — ABNORMAL HIGH (ref 70–99)
Phosphorus: 4.4 mg/dL (ref 2.5–4.6)
Potassium: 4.8 mmol/L (ref 3.5–5.1)
Sodium: 134 mmol/L — ABNORMAL LOW (ref 135–145)

## 2023-04-15 LAB — GLUCOSE, CAPILLARY
Glucose-Capillary: 144 mg/dL — ABNORMAL HIGH (ref 70–99)
Glucose-Capillary: 163 mg/dL — ABNORMAL HIGH (ref 70–99)
Glucose-Capillary: 152 mg/dL — ABNORMAL HIGH (ref 70–99)
Glucose-Capillary: 109 mg/dL — ABNORMAL HIGH (ref 70–99)
Glucose-Capillary: 97 mg/dL (ref 70–99)

## 2023-04-15 LAB — HEPATITIS B SURFACE ANTIBODY, QUANTITATIVE: Hep B S AB Quant (Post): <3.5 m[IU]/mL — ABNORMAL LOW (ref >9.9)

## 2023-04-15 MED ORDER — AMIODARONE HCL 200 MG PO TABS
200.0000 mg | ORAL_TABLET | Freq: Every day | ORAL | Status: DC
  Administered 2023-04-16 – 2023-04-20 (×5): 200 mg via ORAL
  Filled 2023-04-15 (×5): qty 1

## 2023-04-15 MED ORDER — ALTEPLASE 2 MG IJ SOLR: 2.0000 mg | Freq: Once | INTRAMUSCULAR | Status: DC | PRN

## 2023-04-15 MED ORDER — ANTICOAGULANT SODIUM CITRATE 4% (200MG/5ML) IV SOLN: 5.0000 mL | Status: DC | PRN

## 2023-04-15 MED ORDER — TORSEMIDE 20 MG PO TABS
40.0000 mg | ORAL_TABLET | ORAL | Status: DC
  Administered 2023-04-16: 40 mg via ORAL
  Filled 2023-04-15 (×2): qty 2

## 2023-04-15 MED ORDER — LIDOCAINE-PRILOCAINE 2.5-2.5 % EX CREA: 1.0000 | TOPICAL_CREAM | CUTANEOUS | Status: DC | PRN

## 2023-04-15 MED ORDER — HEPARIN SODIUM (PORCINE) 1000 UNIT/ML DIALYSIS: 1000.0000 [IU] | INTRAMUSCULAR | Status: DC | PRN

## 2023-04-15 MED ORDER — LIDOCAINE HCL (PF) 1 % IJ SOLN: 5.0000 mL | INTRAMUSCULAR | Status: DC | PRN

## 2023-04-15 MED ORDER — PENTAFLUOROPROP-TETRAFLUOROETH EX AERO: 1.0000 | INHALATION_SPRAY | CUTANEOUS | Status: DC | PRN

## 2023-04-15 NOTE — Progress Notes (Signed)
Progress Note  Patient Name: Jill Hudson Date of Encounter: 04/15/2023  Primary Cardiologist: [facility]  Subjective   No angina. Dyspnea stable. Documented UOP 738 mL for the pasts 24 hours, net + 2.2 L for the admission. SCr improved from 3.13 yesterday to 3.00 today. Followed by advanced heart failure.   Inpatient Medications    Scheduled Meds:  amiodarone  200 mg Oral BID   apixaban  2.5 mg Oral BID   arformoterol  15 mcg Nebulization BID   aspirin EC  81 mg Oral Daily   carvedilol  3.125 mg Oral BID WC   Chlorhexidine Gluconate Cloth  6 each Topical Daily   Chlorhexidine Gluconate Cloth  6 each Topical Q0600   cyanocobalamin  1,000 mcg Oral Daily   feeding supplement (NEPRO CARB STEADY)  237 mL Oral BID BM   gabapentin  100 mg Oral QHS   hydrALAZINE  10 mg Oral Q8H   insulin aspart  0-15 Units Subcutaneous TID WC   insulin aspart  0-5 Units Subcutaneous QHS   insulin aspart  2 Units Subcutaneous TID WC   insulin glargine-yfgn  8 Units Subcutaneous QHS   isosorbide mononitrate  30 mg Oral Daily   multivitamin  1 tablet Oral BID   nystatin   Topical TID   polyethylene glycol  17 g Oral Daily   promethazine  6.25 mg Oral QHS   revefenacin  175 mcg Nebulization Daily   rosuvastatin  10 mg Oral Once per day on Mon Wed Fri Sat   sodium bicarbonate  650 mg Oral BID   [START ON 04/16/2023] torsemide  40 mg Oral Q M,W,F   Continuous Infusions:  sodium chloride Stopped (04/14/23 1008)   anticoagulant sodium citrate     PRN Meds: acetaminophen, ALPRAZolam, alteplase, alum & mag hydroxide-simeth, anticoagulant sodium citrate, dextromethorphan-guaiFENesin, heparin, hydrALAZINE, levalbuterol, lidocaine (PF), lidocaine-prilocaine, melatonin, ondansetron (ZOFRAN) IV, mouth rinse, oxyCODONE, pentafluoroprop-tetrafluoroeth, phenol, senna-docusate, torsemide, traZODone   Vital Signs    Vitals:   04/15/23 0022 04/15/23 0355 04/15/23 0533 04/15/23 0758  BP: (!) 127/55 (!) 127/48 116/85  (!) 120/47  Pulse: 69 62 65 65  Resp: 20 16  18   Temp: 98.1 F (36.7 C) 98 F (36.7 C)  98.5 F (36.9 C)  TempSrc:      SpO2: 100% 91%  91%  Weight:      Height:        Intake/Output Summary (Last 24 hours) at 04/15/2023 1152 Last data filed at 04/15/2023 1030 Gross per 24 hour  Intake 861.39 ml  Output 700 ml  Net 161.39 ml   Filed Weights   04/08/23 0841 04/09/23 0730 04/13/23 0812  Weight: 58.4 kg 58.5 kg 58.6 kg    Telemetry    Sinus bradycardia, 50s bpm - Personally Reviewed  ECG    No new tracings - Personally Reviewed  Physical Exam   GEN: No acute distress.   Neck: No JVD. Cardiac: RRR, II/VI systolic murmur LLSB, no rubs, or gallops.  Respiratory: Clear to auscultation bilaterally.  GI: Soft, nontender, non-distended.   MS: No edema; No deformity. Neuro:  Alert and oriented x 3; Nonfocal.  Psych: Normal affect.  Labs    Chemistry Recent Labs  Lab 04/13/23 0437 04/14/23 0801 04/15/23 0832  NA 130* 136 134*  K 4.5 4.8 4.8  CL 101 104 103  CO2 18* 22 22  GLUCOSE 108* 93 143*  BUN 68* 65* 63*  CREATININE 3.33* 3.13* 3.00*  CALCIUM 8.2* 8.2*  8.2*  ALBUMIN  --   --  2.7*  GFRNONAA 13* 14* 15*  ANIONGAP 11 10 9      Hematology Recent Labs  Lab 04/12/23 0348 04/13/23 0437 04/14/23 0801  WBC 7.8 8.3 8.9  RBC 2.94* 3.13* 3.19*  HGB 8.1* 8.7* 8.8*  HCT 26.1* 27.7* 28.4*  MCV 88.8 88.5 89.0  MCH 27.6 27.8 27.6  MCHC 31.0 31.4 31.0  RDW 14.0 14.2 14.2  PLT 207 235 229    Cardiac EnzymesNo results for input(s): "TROPONINI" in the last 168 hours. No results for input(s): "TROPIPOC" in the last 168 hours.   BNPNo results for input(s): "BNP", "PROBNP" in the last 168 hours.   DDimer No results for input(s): "DDIMER" in the last 168 hours.   Radiology    No results found.  Cardiac Studies   Limited echo 04/02/2023: 1. Left ventricular ejection fraction, by estimation, is 35 to 40%. The  left ventricle has moderately decreased function.  The left ventricle  demonstrates global hypokinesis. Left ventricular diastolic parameters are  indeterminate.   2. Right ventricular systolic function is normal. The right ventricular  size is normal. Tricuspid regurgitation signal is inadequate for assessing  PA pressure.   3. Left atrial size was moderately dilated.   4. Moderate pleural effusion in the left lateral region.   5. The mitral valve is normal in structure. Moderate to severe mitral  valve regurgitation. No evidence of mitral stenosis.   6. The aortic valve is normal in structure. Aortic valve regurgitation is  not visualized. No aortic stenosis is present.   7. The inferior vena cava is normal in size with <50% respiratory  variability, suggesting right atrial pressure of 8 mmHg.  __________  2D echo 03/26/2023: 1. Left ventricular ejection fraction, by estimation, is 30 to 35%. The  left ventricle has moderately decreased function. Left ventricular  endocardial border not optimally defined to evaluate regional wall motion.  The left ventricular internal cavity  size was mildly dilated. Left ventricular diastolic parameters are  consistent with Grade II diastolic dysfunction (pseudonormalization).   2. Right ventricular systolic function is normal. The right ventricular  size is normal. There is mildly elevated pulmonary artery systolic  pressure.   3. Left atrial size was mildly dilated.   4. The mitral valve is degenerative. Severe mitral valve regurgitation.  No evidence of mitral stenosis.   5. The aortic valve is normal in structure. Aortic valve regurgitation is  not visualized. Aortic valve sclerosis/calcification is present, without  any evidence of aortic stenosis.   6. The inferior vena cava is normal in size with <50% respiratory  variability, suggesting right atrial pressure of 8 mmHg.    Patient Profile     88 y.o. female with history of diabetes mellitus type II, hypertension, coronary artery  disease, peripheral arterial disease, tremor, CVA, congestive heart failure and anemia who is admitted to [facility] on 03/25/2023 for COPD exacerbation complicated by acute on chronic HFrEF and NSTEMI.   Assessment & Plan    1. Acute systolic HF with cardiogenic shock and mitral regurgitation  -Followed by advanced heart failure service - Echo 2023 EF 55-60% - Echo this admit read as EF 30-35%. RV ok  -Per Dr Gala Romney EF 35-40% there is severe HK of basaliar to mid lateral wall and inferolateral wall - High-sensitivity troponin 225 -> 458 - >1186 -> 1165.  BNP 971  - Suspect this is an ischemic CM  - Developed low output HF and elevated  volume status. Initial co-ox 44% CVP 21 (03/29/23) - She has severe MR; only option for treatment is medical management  - Continue hydralazine/Imdur and low-dose carvedilol - No ACEi/ARB/ARNI or MRA with CKD IV - Volume status,and resp status now improved with torsemide 40mg  - would consider using torsemide 40mg  MWF + PRN  for now unless HD is being entertained   2. NSTEMI - Echo as above with RWMA - No angina - Continue ASA/statin - With persistent renal failure and no current angina will defer cath    3. AKI on CKD 3a - Likely ATN/cardiorenal  - Scr 1.5 -> 2.8 -> 2.7 ->->-> 3.78 -> 3.64 -> 3.3 -> 3.13->3.00 - 6/6 renal u/s - no hydronephrosis.  - Renal following   4. Acute hypoxic respiratory failure - Likely due primarily to HF and pleural effusions on top of underlying COPD - S/p R thora on 6/3 with 700cc removed - 6/7 Bilateral pleural effsions - Improving   5. PAF  - Back in NSR  - Initially on amio drip. Now on amio po amio 200 mg twice a day, reduce to 200 mg daily  - On Eliquis 2.5 mg twice a day. Reduced dose with age and creatinine   6. Hypervolemic hyponatremia - Resolved   For questions or updates, please contact [facility] Please consult [email or web address] for contact info under Cardiology/STEMI.    Signed, Eula Listen, PA-C [facility]  [facility] Pager: [phone] 04/15/2023, 11:52 AM

## 2023-04-15 NOTE — Progress Notes (Signed)
[facility]  - [place] [facility]   PATIENT NAME: Jill Hudson    MR#:  [id number]  DATE OF BIRTH:  17-Jun-1935  SUBJECTIVE:   Patient seen earlier. Good UOP after foley placed Dter at bedside and updated HD trial placed on hold--resumed po torsemide per Cardiology MWF and prn  VITALS:  Blood pressure (!) 119/42, pulse (!) 55, temperature 98.3 F (36.8 C), resp. rate 18, height 5' (1.524 m), weight 58.6 kg, SpO2 98 %.  PHYSICAL EXAMINATION:   GENERAL:  88 y.o.-year-old patient with no acute distress. Weak, deconditioned LUNGS: Normal breath sounds bilaterally, no wheezing CARDIOVASCULAR: S1, S2 normal. No murmur   ABDOMEN: Soft, nontender, nondistended. Bowel sounds present. FOLEY+ EXTREMITIES: trace edema b/l.    NEUROLOGIC: nonfocal  patient is alert and awake, weak .   LABORATORY PANEL:  CBC Recent Labs  Lab 04/14/23 0801  WBC 8.9  HGB 8.8*  HCT 28.4*  PLT 229     Chemistries  Recent Labs  Lab 04/12/23 0348 04/13/23 0437 04/15/23 0832  NA 131*   < > 134*  K 4.9   < > 4.8  CL 101   < > 103  CO2 19*   < > 22  GLUCOSE 107*   < > 143*  BUN 78*   < > 63*  CREATININE 3.64*   < > 3.00*  CALCIUM 8.0*   < > 8.2*  MG 2.3  --   --    < > = values in this interval not displayed.    RADIOLOGY:  No results found.  Assessment and Plan  88 year old female with history of COPD, HTN, HLD, DM2, stroke, TIA, GERD, anxiety, PVD, CAD, MI, former tobacco use, CKD stage III, GI bleed, iron deficiency anemia, PAD status post PCI comes to the hospital with shortness of breath and hypoxia. Patient was found to have bilateral pulmonary edema /effusion with elevated BNP.      Assessment and Plan:  Acute on chronic systolic CHF (congestive heart failure) (HCC) Acute respiratory failure with hypoxia (HCC) Ischemic cardiomyopathy --Patient initially requiring BiPAP secondary to respiratory distress.  S/p supplemental O2 inhalation, gradually weaned off  currently on room air. --cont hydralazine, Imdur and low-dose Coreg.  No ACE/ARB or spironolactone with her worsening kidney function. --6/18--prn torsemide for now given worsening creat --6/19--seen by Cardiology--recommends torsemide 40 mg MWF and prn. HOld HD for now. Creat 3.13 --6/20-- creat 3.0 cont with good UOP   Paroxysmal atrial fibrillation (HCC) --Patient converted to normal sinus rhythm.  Taken off amiodarone drip and switched to oral on 6/7. --  Heparin drip converted over to Eliquis on 6/7. --Patient is on amiodarone 200 mg p.o. twice daily, continue for 1 month and then decrease dose as per cardiology.   Acute kidney injury superimposed on CKD (HCC) --AKI on CKD stage IIIa.  Creatinine now mid-3s --Nephrology Dr Cherylann Ratel discussed with patient's daughter and they are in agreement with hemodialysis. --Scr 1.5 -> 2.8 -> 2.7 ->->-> 3.78 -> 3.64 -> 3.3 -> 3.13 >>3.0 -- Vascular consultation placed --holding Perm cath placement for now  Cardiogenic shock [facility]) Cardiology took off milrinone drip 6/7.  Not on any pressors at this point.  Acute urinary retention Today patient reports that most of this hospitalization unable to urinate on her own, only with valsalva. Bladder scan today shows retention of 445 ml. Pt had in and out before and had a foley also eariler in the admission --Renal u/s no evidence of hydronephrosis.  Shows bladder post void volume around 300. -- Patient unable to void. Bladder scan again today showed around 600 mL. Discussed with Dr. Richardo Hanks at present given no other option will place Foley catheter and have patient follow-up urology as outpatient for emptying study. --good UOP after Foley placement  NSTEMI (non-ST elevated myocardial infarction) (HCC) -- Continue aspirin, Imdur, Coreg and low-dose Crestor.   Type II diabetes mellitus with renal manifestations (HCC) --Last hemoglobin A1c 7.9.  Hypoglycemic episode the other morning.  Decreased Semglee  insulin down to 8 units at night.   --Continue sliding scale.   COPD exacerbation (HCC) --Continue nebulizers.   Essential hypertension --Continue hydralazine, Imdur and low-dose Coreg.   History of CVA (cerebrovascular accident) --Patient on Eliquis for stroke prevention   HLD (hyperlipidemia) --On Crestor   chronic anemia --Stable. Last hemoglobin 8.8.     Pleural effusion Right thoracentesis removed 700 mL of fluid, transudate.  This was done by interventional radiology on 6/3.   Body mass index is 25.19 kg/m.  Interventions:   Consults : palliative care, [facility] cardiology and nephrology   Diet: Heart healthy diet DVT Prophylaxis: Therapeutic Anticoagulation with Eliquis     Advance goals of care discussion: DNR   Family Communication:  daughter at bedside     TOTAL TIME TAKING CARE OF THIS PATIENT: 86 minutes.  >50% time spent on counselling and coordination of care  Note: This dictation was prepared with Dragon dictation along with smaller phrase technology. Any transcriptional errors that result from this process are unintentional.  Enedina Finner M.D    [facility]   CC: Primary care physician; Reubin Milan, MD

## 2023-04-15 NOTE — Progress Notes (Signed)
Patient states his abdominal adjustments at all level. [facility]  ROUNDING NOTE   Subjective:   [name] was admitted to [facility] on 03/25/2023 for COPD exacerbation (HCC) [J44.1] Chest pain [R07.9] Community acquired pneumonia, unspecified laterality [J18.9]  Patient sitting up in bed Alert and oriented to self  Room air Trace lower extremity edema  Room air No lower extremity edema  UOP 1 59 L Creatinine 3.00  Objective:  Vital signs in last 24 hours:  Temp:  [97.6 F (36.4 C)-98.5 F (36.9 C)] 98.3 F (36.8 C) (06/20 1227) Pulse Rate:  [55-69] 55 (06/20 1227) Resp:  [16-20] 18 (06/20 1227) BP: (111-145)/(42-85) 119/42 (06/20 1227) SpO2:  [91 %-100 %] 98 % (06/20 1227)  Weight change:  Filed Weights   04/08/23 0841 04/09/23 0730 04/13/23 0812  Weight: 58.4 kg 58.5 kg 58.6 kg    Intake/Output: I/O last 3 completed shifts: In: 426.2 [P.O.:340; I.V.:86.2] Out: 1550 [Urine:1550]   Intake/Output this shift:  Total I/O In: 600 [P.O.:600] Out: -   Physical Exam: General: NAD, laying in bed  Head: Normocephalic, atraumatic. Moist oral mucosal membranes  Eyes: Anicteric  Lungs:  Diminished in bases, room air  Heart: regular  Abdomen:  Soft, nontender  Extremities:  trace peripheral edema.  Neurologic: Alert and oriented to self, moving all four extremities  Skin: warm        Basic Metabolic Panel: Recent Labs  Lab 04/09/23 1026 04/10/23 0529 04/11/23 0424 04/12/23 0348 04/13/23 0437 04/14/23 0801 04/15/23 0832  NA 131* 133* 131* 131* 130* 136 134*  K 4.6 4.8 4.8 4.9 4.5 4.8 4.8  CL 100 102 99 101 101 104 103  CO2 22 21* 21* 19* 18* 22 22  GLUCOSE 121* 86 123* 107* 108* 93 143*  BUN 76* 80* 78* 78* 68* 65* 63*  CREATININE 3.80* 3.80* 3.78* 3.64* 3.33* 3.13* 3.00*  CALCIUM 8.3* 8.5* 8.5* 8.0* 8.2* 8.2* 8.2*  MG 2.3 2.4 2.4 2.3  --   --   --   PHOS 4.9* 4.9* 5.0* 4.9*  --   --  4.4     Liver Function Tests: Recent Labs   Lab 04/15/23 0832  ALBUMIN 2.7*    No results for input(s): "LIPASE", "AMYLASE" in the last 168 hours. No results for input(s): "AMMONIA" in the last 168 hours.  CBC: Recent Labs  Lab 04/10/23 0529 04/11/23 0424 04/12/23 0348 04/13/23 0437 04/14/23 0801  WBC 8.3 9.7 7.8 8.3 8.9  HGB 8.5* 8.9* 8.1* 8.7* 8.8*  HCT 26.8* 28.8* 26.1* 27.7* 28.4*  MCV 88.7 89.4 88.8 88.5 89.0  PLT 208 243 207 235 229     Cardiac Enzymes: No results for input(s): "CKTOTAL", "CKMB", "CKMBINDEX", "TROPONINI" in the last 168 hours.  BNP: Invalid input(s): "POCBNP"  CBG: Recent Labs  Lab 04/14/23 1251 04/14/23 1705 04/14/23 2147 04/15/23 0801 04/15/23 1230  GLUCAP 128* 62* 195* 144* 163*     Microbiology: Results for orders placed or performed during the hospital encounter of 03/25/23  Blood Culture (routine x 2)     Status: None   Collection Time: 03/25/23 12:09 PM   Specimen: BLOOD  Result Value Ref Range Status   Specimen Description BLOOD LEFT [facility]  Final   Special Requests   Final    BOTTLES DRAWN AEROBIC AND ANAEROBIC Blood Culture adequate volume   Culture   Final    NO GROWTH 5 DAYS Performed at [facility], [address] [place], [place] [address]  Report Status 03/30/2023 FINAL  Final  Blood Culture (routine x 2)     Status: None   Collection Time: 03/25/23 12:10 PM   Specimen: BLOOD  Result Value Ref Range Status   Specimen Description BLOOD LEFT FA  Final   Special Requests   Final    BOTTLES DRAWN AEROBIC AND ANAEROBIC Blood Culture adequate volume   Culture   Final    NO GROWTH 5 DAYS Performed at [facility], [address] [place], [place] [address]    Report Status 03/30/2023 FINAL  Final  SARS Coronavirus 2 by RT PCR (hospital order, performed in [facility] hospital lab) *cepheid single result test* Anterior Nasal Swab     Status: None   Collection Time: 03/25/23  1:18 PM   Specimen: Anterior Nasal Swab  Result Value Ref Range  Status   SARS Coronavirus 2 by RT PCR NEGATIVE NEGATIVE Final    Comment: (NOTE) SARS-CoV-2 target nucleic acids are NOT DETECTED.  The SARS-CoV-2 RNA is generally detectable in upper and lower respiratory specimens during the acute phase of infection. The lowest concentration of SARS-CoV-2 viral copies this assay can detect is 250 copies / mL. A negative result does not preclude SARS-CoV-2 infection and should not be used as the sole basis for treatment or other patient management decisions.  A negative result may occur with improper specimen collection / handling, submission of specimen other than nasopharyngeal swab, presence of viral mutation(s) within the areas targeted by this assay, and inadequate number of viral copies (<250 copies / mL). A negative result must be combined with clinical observations, patient history, and epidemiological information.  Fact Sheet for Patients:   RoadLapTop.co.za  Fact Sheet for Healthcare Providers: http://kim-miller.com/  This test is not yet approved or  cleared by the [place] FDA and has been authorized for detection and/or diagnosis of SARS-CoV-2 by FDA under an Emergency Use Authorization (EUA).  This EUA will remain in effect (meaning this test can be used) for the duration of the COVID-19 declaration under Section 564(b)(1) of the Act, 21 U.S.C. section 360bbb-3(b)(1), unless the authorization is terminated or revoked sooner.  Performed at [facility], [address] [place], [place] [address]   MRSA Next Gen by PCR, Nasal     Status: None   Collection Time: 03/27/23  4:40 AM   Specimen: Nasal Mucosa; Nasal Swab  Result Value Ref Range Status   MRSA by PCR Next Gen NOT DETECTED NOT DETECTED Final    Comment: (NOTE) The GeneXpert MRSA Assay (FDA approved for NASAL specimens only), is one component of a comprehensive MRSA colonization surveillance program. It is not  intended to diagnose MRSA infection nor to guide or monitor treatment for MRSA infections. Test performance is not FDA approved in patients less than 54 years old. Performed at [facility], [address] [place], [place] [address]   Respiratory (~20 pathogens) panel by PCR     Status: None   Collection Time: 03/28/23  4:00 PM   Specimen: Nasopharyngeal Swab; Respiratory  Result Value Ref Range Status   Adenovirus NOT DETECTED NOT DETECTED Final   Coronavirus 229E NOT DETECTED NOT DETECTED Final    Comment: (NOTE) The Coronavirus on the Respiratory Panel, DOES NOT test for the novel  Coronavirus (2019 nCoV)    Coronavirus HKU1 NOT DETECTED NOT DETECTED Final   Coronavirus NL63 NOT DETECTED NOT DETECTED Final   Coronavirus OC43 NOT DETECTED NOT DETECTED Final   Metapneumovirus NOT  DETECTED NOT DETECTED Final   Rhinovirus / Enterovirus NOT DETECTED NOT DETECTED Final   Influenza A NOT DETECTED NOT DETECTED Final   Influenza B NOT DETECTED NOT DETECTED Final   Parainfluenza Virus 1 NOT DETECTED NOT DETECTED Final   Parainfluenza Virus 2 NOT DETECTED NOT DETECTED Final   Parainfluenza Virus 3 NOT DETECTED NOT DETECTED Final   Parainfluenza Virus 4 NOT DETECTED NOT DETECTED Final   Respiratory Syncytial Virus NOT DETECTED NOT DETECTED Final   Bordetella pertussis NOT DETECTED NOT DETECTED Final   Bordetella Parapertussis NOT DETECTED NOT DETECTED Final   Chlamydophila pneumoniae NOT DETECTED NOT DETECTED Final   Mycoplasma pneumoniae NOT DETECTED NOT DETECTED Final    Comment: Performed at [facility], [address] [place], [place] [address]  Body fluid culture w Gram Stain     Status: None   Collection Time: 03/29/23 10:27 AM   Specimen: PATH Cytology Pleural fluid  Result Value Ref Range Status   Specimen Description   Final    PLEURAL Performed at [facility], [address], [place], [place] [address]    Special Requests   Final     NONE Performed at [facility], [address] [place], [place] [address]    Gram Stain NO WBC SEEN NO ORGANISMS SEEN   Final   Culture   Final    NO GROWTH 3 DAYS Performed at [facility], [address] [place], [place] [address]    Report Status 04/01/2023 FINAL  Final  MRSA Next Gen by PCR, Nasal     Status: None   Collection Time: 03/29/23  3:00 PM   Specimen: Nasal Mucosa; Nasal Swab  Result Value Ref Range Status   MRSA by PCR Next Gen NOT DETECTED NOT DETECTED Final    Comment: (NOTE) The GeneXpert MRSA Assay (FDA approved for NASAL specimens only), is one component of a comprehensive MRSA colonization surveillance program. It is not intended to diagnose MRSA infection nor to guide or monitor treatment for MRSA infections. Test performance is not FDA approved in patients less than 28 years old. Performed at [facility], [address] [place], [place] [address]   Group A Strep by PCR     Status: None   Collection Time: 03/30/23  6:00 PM   Specimen: Throat; Sterile Swab  Result Value Ref Range Status   Group A Strep by PCR NOT DETECTED NOT DETECTED Final    Comment: Performed at [facility], [address] [place], [place] [address]    Coagulation Studies: No results for input(s): "LABPROT", "INR" in the last 72 hours.   Urinalysis: No results for input(s): "COLORURINE", "LABSPEC", "PHURINE", "GLUCOSEU", "HGBUR", "BILIRUBINUR", "KETONESUR", "PROTEINUR", "UROBILINOGEN", "NITRITE", "LEUKOCYTESUR" in the last 72 hours.  Invalid input(s): "APPERANCEUR"    Imaging: No results found.   Medications:    sodium chloride Stopped (04/14/23 1008)   anticoagulant sodium citrate       [START ON 04/16/2023] amiodarone  200 mg Oral Daily   apixaban  2.5 mg Oral BID   arformoterol  15 mcg Nebulization BID   aspirin EC  81 mg Oral Daily   carvedilol  3.125 mg Oral BID WC   Chlorhexidine Gluconate Cloth  6 each Topical Daily    Chlorhexidine Gluconate Cloth  6 each Topical Q0600   cyanocobalamin  1,000 mcg Oral Daily   feeding supplement (NEPRO CARB STEADY)  237 mL Oral BID BM   gabapentin  100 mg Oral QHS   hydrALAZINE  10 mg Oral Q8H   insulin aspart  0-15 Units Subcutaneous TID WC   insulin aspart  0-5 Units Subcutaneous QHS   insulin aspart  2 Units Subcutaneous TID WC   insulin glargine-yfgn  8 Units Subcutaneous QHS   isosorbide mononitrate  30 mg Oral Daily   multivitamin  1 tablet Oral BID   nystatin   Topical TID   polyethylene glycol  17 g Oral Daily   promethazine  6.25 mg Oral QHS   revefenacin  175 mcg Nebulization Daily   rosuvastatin  10 mg Oral Once per day on Mon Wed Fri Sat   sodium bicarbonate  650 mg Oral BID   [START ON 04/16/2023] torsemide  40 mg Oral Q M,W,F   acetaminophen, ALPRAZolam, alteplase, alum & mag hydroxide-simeth, anticoagulant sodium citrate, dextromethorphan-guaiFENesin, heparin, hydrALAZINE, levalbuterol, lidocaine (PF), lidocaine-prilocaine, melatonin, ondansetron (ZOFRAN) IV, mouth rinse, oxyCODONE, pentafluoroprop-tetrafluoroeth, phenol, senna-docusate, torsemide, traZODone  Assessment/ Plan:  [name] is a 88 y.o.  female with diabetes mellitus type II, hypertension, coronary artery disease, peripheral arterial disease, tremor, CVA, congestive heart failure , anxiety, peripheral arterial diease and anemia who is admitted to [facility] on 03/25/2023 for COPD exacerbation (HCC) [J44.1] Chest pain [R07.9] Community acquired pneumonia, unspecified laterality [J18.9]  Acute kidney injury with acute metabolic acidosis on chronic kidney disease stage IIIB: baseline creatinine of 1.48, GFR of 34 on 12/09/2022. Acute kidney injury secondary to acute cardiorenal syndrome . Chronic kidney disease secondary to hypertension.  Creatinine slowly improving. After discussing this patient with Cardiology, we will hold dialysis for now and allow for diuresis. Will continue to monitor renal  function.    Hypertension with chronic kidney disease: with chronic systolic and diastolic congestive heart failure.  Appreciate cardiology input.  Heart failure regimen includes isosorbide/hydralazine and carvedilol Blood pressure remains stable Torsemide 40mg  ordered PRN   Hyponatremia: Sodium 134 Encouraged to increase oral intake  4.  Hypokalemia Potassium corrected     LOS: 21   6/20/20241:35 PM

## 2023-04-15 NOTE — Progress Notes (Addendum)
Daily Progress Note   Patient Name: Jill Hudson       Date: 04/15/2023 DOB: 1935-08-13  Age: 88 y.o. MRN#: [id number] Attending Physician: Enedina Finner, MD Primary Care Physician: Reubin Milan, MD Admit Date: 03/25/2023  Reason for Consultation/Follow-up: Establishing goals of care  Subjective: Notes and labs reviewed. Currently renal function is slowly improving. Patient and daughter yesterday discussed talking more as a family, and with patient's son who is a neurologist in [facility] regarding initiating dialysis should that time come. Daughter has stated and patient confirmed she is HPOA if patient is unable to make decisions.  DNR/DNI/ no feeding tube.   PMT will shadow for decline.  Recommend outpatient palliative.    Length of Stay: 21  Current Medications: Scheduled Meds:   amiodarone  200 mg Oral BID   apixaban  2.5 mg Oral BID   arformoterol  15 mcg Nebulization BID   aspirin EC  81 mg Oral Daily   carvedilol  3.125 mg Oral BID WC   Chlorhexidine Gluconate Cloth  6 each Topical Daily   Chlorhexidine Gluconate Cloth  6 each Topical Q0600   cyanocobalamin  1,000 mcg Oral Daily   feeding supplement (NEPRO CARB STEADY)  237 mL Oral BID BM   gabapentin  100 mg Oral QHS   hydrALAZINE  10 mg Oral Q8H   insulin aspart  0-15 Units Subcutaneous TID WC   insulin aspart  0-5 Units Subcutaneous QHS   insulin aspart  2 Units Subcutaneous TID WC   insulin glargine-yfgn  8 Units Subcutaneous QHS   isosorbide mononitrate  30 mg Oral Daily   multivitamin  1 tablet Oral BID   nystatin   Topical TID   polyethylene glycol  17 g Oral Daily   promethazine  6.25 mg Oral QHS   revefenacin  175 mcg Nebulization Daily   rosuvastatin  10 mg Oral Once per day on Mon Wed Fri Sat    sodium bicarbonate  650 mg Oral BID    Continuous Infusions:  sodium chloride Stopped (04/14/23 1008)    PRN Meds: acetaminophen, ALPRAZolam, alum & mag hydroxide-simeth, dextromethorphan-guaiFENesin, hydrALAZINE, levalbuterol, melatonin, ondansetron (ZOFRAN) IV, mouth rinse, oxyCODONE, phenol, senna-docusate, torsemide, traZODone  Physical Exam Pulmonary:     Effort: Pulmonary effort is normal.  Neurological:  Mental Status: She is alert.             Vital Signs: BP (!) 120/47 (BP Location: Left Arm)   Pulse 65   Temp 98.5 F (36.9 C)   Resp 18   Ht 5' (1.524 m)   Wt 58.6 kg   SpO2 91%   BMI 25.23 kg/m  SpO2: SpO2: 91 % O2 Device: O2 Device: Room Air O2 Flow Rate: O2 Flow Rate (L/min): 2 L/min  Intake/output summary:  Intake/Output Summary (Last 24 hours) at 04/15/2023 0930 Last data filed at 04/15/2023 0355 Gross per 24 hour  Intake 261.39 ml  Output 1000 ml  Net -738.61 ml   LBM: Last BM Date : 04/12/23 Baseline Weight: Weight: 56.2 kg Most recent weight: Weight: 58.6 kg       Patient Active Problem List   Diagnosis Date Noted   Acute urinary retention 04/04/2023   Community acquired pneumonia 04/03/2023   Hyperkalemia 04/03/2023   Pleural effusion 04/02/2023   Acute on chronic systolic CHF (congestive heart failure) (HCC) 03/31/2023   Paroxysmal atrial fibrillation (HCC) 03/31/2023   Hypokalemia 03/31/2023   Cardiogenic shock (HCC) 03/28/2023   COPD exacerbation (HCC) 03/25/2023   HLD (hyperlipidemia) 03/25/2023   Type II diabetes mellitus with renal manifestations (HCC) 03/25/2023   CAD (coronary artery disease) 03/25/2023   Iron deficiency anemia 03/25/2023   PAD (peripheral artery disease) (HCC) 03/25/2023   Acquired thrombophilia (HCC) 03/20/2021   Mood disorder (HCC) 05/02/2020   Chronic heart failure with preserved ejection fraction (HCC) 04/12/2020   Diverticulosis of large intestine without perforation or abscess with bleeding 03/27/2020    COPD (chronic obstructive pulmonary disease) with chronic bronchitis    NSTEMI (non-ST elevated myocardial infarction) (HCC)    Acute respiratory failure with hypoxia (HCC) 02/18/2020   CKD (chronic kidney disease), stage IIIa 02/18/2020   Hyponatremia 02/18/2020   Malnutrition of mild degree (HCC) 01/08/2020   Acute kidney injury superimposed on CKD (HCC)    Age-related macular degeneration, dry, left eye 06/21/2019   Age-related macular degeneration, wet, right eye (HCC) 06/21/2019   Moderate nonproliferative diabetic retinopathy associated with type 2 diabetes mellitus (HCC) 06/21/2019   Lumbosacral radiculopathy at L4 05/01/2019   Underweight 05/01/2019   Encounter for long-term (current) use of aspirin 10/31/2018   Long term current use of oral hypoglycemic drug 10/31/2018   Encounter for long-term (current) use of insulin (HCC) 10/31/2018   Peptic ulcer disease 08/11/2018   Leg pain 07/03/2017   Carpal tunnel syndrome on both sides 05/05/2017   Myalgia due to HMG CoA reductase inhibitor 05/05/2017   History of CVA (cerebrovascular accident) 02/15/2017   Degenerative disc disease, lumbar 12/21/2016   Atherosclerosis of native arteries of extremity with intermittent claudication (HCC) 12/20/2016   Bilateral carotid artery stenosis 12/20/2016   Occlusion and stenosis of bilateral carotid arteries 12/20/2016   Hip bursitis 05/18/2016   Elevated TSH 01/18/2016   CKD stage 3 due to type 2 diabetes mellitus (HCC) 01/16/2016   Senile ecchymosis 01/16/2016   Type II diabetes mellitus with complication (HCC)    GERD (gastroesophageal reflux disease) 07/24/2015   PVD (peripheral vascular disease) (HCC) 05/17/2015   Neoplasm of uncertain behavior of skin 05/17/2015   Retinopathy, diabetic, proliferative (HCC) 04/11/2015   Hyperlipidemia associated with type 2 diabetes mellitus (HCC) 04/11/2015   Essential hypertension 04/11/2015   Generalized OA 04/11/2015   Proliferative diabetic  retinopathy(362.02) 04/11/2015   Arteriosclerosis of coronary artery 05/29/2013   Hypertensive heart disease without  CHF 05/29/2013    Palliative Care Assessment & Plan    Recommendations/Plan: Renal function is improving.  Patient and family discussing if they would want to initiate dialysis should renal function decline again. DNR/DNI/no feeding tube PMT will shadow for decline.   Recommend outpatient palliative  Code Status:    Code Status Orders  (From admission, onward)           Start     Ordered   03/31/23 1502  Do not attempt resuscitation (DNR)  Continuous       Question Answer Comment  If patient has no pulse and is not breathing Do Not Attempt Resuscitation   If patient has a pulse and/or is breathing: Medical Treatment Goals LIMITED ADDITIONAL INTERVENTIONS: Use medication/IV fluids and cardiac monitoring as indicated; Do not use intubation or mechanical ventilation (DNI), also provide comfort medications.  Transfer to Progressive/Stepdown as indicated, avoid Intensive Care.   Consent: Discussion documented in EHR or advanced directives reviewed      03/31/23 1501           Code Status History     Date Active Date Inactive Code Status Order ID Comments User Context   03/25/2023 1214 03/31/2023 1501 Full Code [id number]  Lorretta Harp, MD ED   02/18/2020 1059 02/26/2020 2310 Full Code [id number]  Lorretta Harp, MD ED   12/25/2019 0555 12/28/2019 2204 Full Code [id number]  Mansy, Vernetta Honey, MD ED   10/12/2018 1647 10/15/2018 1808 Full Code [id number]  Schnier, Latina Craver, MD Inpatient   09/16/2018 1254 09/16/2018 2048 Full Code [id number]  Gilda Crease, Latina Craver, MD Inpatient   08/11/2018 1706 08/13/2018 2136 Full Code [id number]  Bertrum Sol, MD Inpatient   07/26/2018 0921 07/26/2018 1652 Full Code [id number]  Renford Dills, MD Inpatient   08/23/2017 0935 08/23/2017 1419 Full Code [id number]  Annice Needy, MD Inpatient   02/15/2017 1538 02/16/2017 1959 Full Code [id number]  Auburn Bilberry, MD Inpatient       Prognosis:  Unable to determine   Thank you for allowing the Palliative Medicine Team to assist in the care of this patient.    Morton Stall, NP  Please contact Palliative Medicine Team phone at [phone] for questions and concerns.

## 2023-04-15 NOTE — Care Management Important Message (Signed)
Important Message  Patient Details  Name: Jill Hudson MRN: [id number] Date of Birth: 02/12/1935   Medicare Important Message Given:  Yes     [name] 04/15/2023, 2:05 PM

## 2023-04-15 NOTE — Telephone Encounter (Signed)
S/W pt's daughter.  Pt is currently in-patient, then going to [facility] for rehab.  She will call office to schedule appt when pt is discharged.

## 2023-04-15 NOTE — Progress Notes (Signed)
PT Cancellation Note  Patient Details Name: Jill Hudson MRN: [id number] DOB: Sep 14, 1935   Cancelled Treatment:     Pt in bed upon arrival, had been up 2x's today to [facility] and returned to bed. Pt declined OOB with PT due to c/o fatigue. Will continue per POC next available date/time.   Jannet Askew 04/15/2023, 2:50 PM

## 2023-04-16 DIAGNOSIS — I214 Non-ST elevation (NSTEMI) myocardial infarction: Secondary | ICD-10-CM | POA: Diagnosis not present

## 2023-04-16 DIAGNOSIS — N179 Acute kidney failure, unspecified: Secondary | ICD-10-CM | POA: Diagnosis not present

## 2023-04-16 DIAGNOSIS — I5023 Acute on chronic systolic (congestive) heart failure: Secondary | ICD-10-CM | POA: Diagnosis not present

## 2023-04-16 DIAGNOSIS — N189 Chronic kidney disease, unspecified: Secondary | ICD-10-CM | POA: Diagnosis not present

## 2023-04-16 LAB — BASIC METABOLIC PANEL
Anion gap: 9 (ref 5–15)
BUN: 65 mg/dL — ABNORMAL HIGH (ref 8–23)
CO2: 23 mmol/L (ref 22–32)
Calcium: 8.3 mg/dL — ABNORMAL LOW (ref 8.9–10.3)
Chloride: 101 mmol/L (ref 98–111)
Creatinine, Ser: 3.07 mg/dL — ABNORMAL HIGH (ref 0.44–1.00)
GFR, Estimated: 14 mL/min — ABNORMAL LOW (ref >60)
Glucose, Bld: 122 mg/dL — ABNORMAL HIGH (ref 70–99)
Potassium: 4.6 mmol/L (ref 3.5–5.1)
Sodium: 133 mmol/L — ABNORMAL LOW (ref 135–145)

## 2023-04-16 LAB — GLUCOSE, CAPILLARY
Glucose-Capillary: 106 mg/dL — ABNORMAL HIGH (ref 70–99)
Glucose-Capillary: 192 mg/dL — ABNORMAL HIGH (ref 70–99)
Glucose-Capillary: 152 mg/dL — ABNORMAL HIGH (ref 70–99)
Glucose-Capillary: 160 mg/dL — ABNORMAL HIGH (ref 70–99)

## 2023-04-16 MED ORDER — METHYLPREDNISOLONE SODIUM SUCC 125 MG IJ SOLR: 125.0000 mg | Freq: Once | INTRAMUSCULAR | Status: DC | PRN

## 2023-04-16 MED ORDER — DIPHENHYDRAMINE HCL 50 MG/ML IJ SOLN: 50.0000 mg | Freq: Once | INTRAMUSCULAR | Status: DC | PRN

## 2023-04-16 MED ORDER — CEFAZOLIN SODIUM-DEXTROSE 2-4 GM/100ML-% IV SOLN
2.0000 g | INTRAVENOUS | Status: DC
  Filled 2023-04-16: qty 100

## 2023-04-16 MED ORDER — ONDANSETRON HCL 4 MG/2ML IJ SOLN: 4.0000 mg | Freq: Four times a day (QID) | INTRAMUSCULAR | Status: DC | PRN

## 2023-04-16 MED ORDER — FAMOTIDINE 20 MG PO TABS: 40.0000 mg | ORAL_TABLET | Freq: Once | ORAL | Status: DC | PRN

## 2023-04-16 MED ORDER — MIDAZOLAM HCL 2 MG/ML PO SYRP
8.0000 mg | ORAL_SOLUTION | Freq: Once | ORAL | Status: DC | PRN
  Filled 2023-04-16: qty 5

## 2023-04-16 MED ORDER — HYDROMORPHONE HCL 1 MG/ML IJ SOLN: 1.0000 mg | Freq: Once | INTRAMUSCULAR | Status: DC | PRN

## 2023-04-16 MED ORDER — FENTANYL CITRATE PF 50 MCG/ML IJ SOSY: 12.5000 ug | PREFILLED_SYRINGE | Freq: Once | INTRAMUSCULAR | Status: DC | PRN

## 2023-04-16 NOTE — Progress Notes (Addendum)
[facility]  - [place] [facility]   PATIENT NAME: Jill Hudson    MR#:  [id number]  DATE OF BIRTH:  Jul 07, 1935  SUBJECTIVE:   Patient seen earlier. No family at bedside during my visit. Patient remains with poor appetite. She says she is trying to eat. Drinks protein shakes. Urine output 350 mL yesterday. HD trial placed on hold--resumed po torsemide per Cardiology MWF and prn  VITALS:  Blood pressure (!) 129/43, pulse (!) 58, temperature 98 F (36.7 C), temperature source Oral, resp. rate 20, height 5' (1.524 m), weight 60.6 kg, SpO2 98 %.  PHYSICAL EXAMINATION:   GENERAL:  88 y.o.-year-old patient with no acute distress. Weak, deconditioned LUNGS: Normal breath sounds bilaterally, no wheezing CARDIOVASCULAR: S1, S2 normal. No murmur   ABDOMEN: Soft, nontender, nondistended. Bowel sounds present. FOLEY+ EXTREMITIES: trace edema b/l.    NEUROLOGIC: nonfocal  patient is alert and awake, weak .   LABORATORY PANEL:  CBC Recent Labs  Lab 04/14/23 0801  WBC 8.9  HGB 8.8*  HCT 28.4*  PLT 229     Chemistries  Recent Labs  Lab 04/12/23 0348 04/13/23 0437 04/16/23 0933  NA 131*   < > 133*  K 4.9   < > 4.6  CL 101   < > 101  CO2 19*   < > 23  GLUCOSE 107*   < > 122*  BUN 78*   < > 65*  CREATININE 3.64*   < > 3.07*  CALCIUM 8.0*   < > 8.3*  MG 2.3  --   --    < > = values in this interval not displayed.    RADIOLOGY:  No results found.  Assessment and Plan  88 year old female with history of COPD, HTN, HLD, DM2, stroke, TIA, GERD, anxiety, PVD, CAD, MI, former tobacco use, CKD stage III, GI bleed, iron deficiency anemia, PAD status post PCI comes to the hospital with shortness of breath and hypoxia. Patient was found to have bilateral pulmonary edema /effusion with elevated BNP.      Assessment and Plan:  Acute on chronic systolic CHF (congestive heart failure) (HCC) Acute respiratory failure with hypoxia (HCC) Ischemic  cardiomyopathy --Patient initially requiring BiPAP secondary to respiratory distress.  S/p supplemental O2 inhalation, gradually weaned off currently on room air. --cont hydralazine, Imdur and low-dose Coreg.  No ACE/ARB or spironolactone with her worsening kidney function. --6/18--prn torsemide for now given worsening creat --6/19--seen by Cardiology--recommends torsemide 40 mg MWF and prn. HOld HD for now. Creat 3.13 --6/20-- creat 3.0 cont with good UOP --6/21--uop 350 cc , appears Euvolemic. Creat 3.07. [facility] cardiology signed off  Cardiogenic shock [facility]) --Cardiology took off milrinone drip 6/7.  Not on any pressors at this point.   Paroxysmal atrial fibrillation (HCC) --Patient converted to normal sinus rhythm.  Taken off amiodarone drip and switched to oral on 6/7. --  Heparin drip converted over to Eliquis on 6/7. --Patient is on amiodarone 200 mg p.o. twice daily, continue for 1 month and then decrease dose as per cardiology.   Acute kidney injury superimposed on CKD (HCC) --AKI on CKD stage IIIa.  Creatinine now mid-3s --Nephrology Dr Cherylann Ratel discussed with patient's daughter and they are in agreement with hemodialysis. --Scr 1.5 -> 2.8 -> 2.7 ->->-> 3.78 -> 3.64 -> 3.3 -> 3.13 >>3.0>>3.07 -- Vascular consultation placed --holding Perm cath placement for now  Acute urinary retention --Today patient reports that most of this hospitalization unable to urinate on her own,  only with valsalva. Bladder scan today shows retention of 445 ml. Pt had in and out before and had a foley also eariler in the admission --Renal u/s no evidence of hydronephrosis. Shows bladder post void volume around 300. -- Patient unable to void. Bladder scan again today showed around 600 mL. Discussed with Dr. Richardo Hanks at present given no other option will place Foley catheter and have patient follow-up urology as outpatient for emptying study. --good UOP after Foley placement  NSTEMI (non-ST elevated myocardial  infarction) (HCC) -- Continue aspirin, Imdur, Coreg and low-dose Crestor.   Type II diabetes mellitus with renal manifestations (HCC) --Last hemoglobin A1c 7.9.  Hypoglycemic episode the other morning.  Decreased Semglee insulin down to 8 units at night.   --Continue sliding scale.   COPD exacerbation (HCC) --Continue nebulizers.   Essential hypertension --Continue hydralazine, Imdur and low-dose Coreg.   History of CVA (cerebrovascular accident) --Patient on Eliquis for stroke prevention   HLD (hyperlipidemia) --On Crestor   chronic anemia --Stable. Last hemoglobin 8.8.     Pleural effusion Right thoracentesis removed 700 mL of fluid, transudate.  This was done by interventional radiology on 6/3.   Body mass index is 25.19 kg/m.  Interventions:  Long term poor prognosis Consults : palliative care, [facility] cardiology and nephrology   Diet: Heart healthy diet DVT Prophylaxis: Therapeutic Anticoagulation with Eliquis     Advance goals of care discussion: DNR   Family Communication:  none today     TOTAL TIME TAKING CARE OF THIS PATIENT: 35 minutes.  >50% time spent on counselling and coordination of care  Note: This dictation was prepared with Dragon dictation along with smaller phrase technology. Any transcriptional errors that result from this process are unintentional.  Enedina Finner M.D    [facility]   CC: Primary care physician; Reubin Milan, MD

## 2023-04-16 NOTE — Progress Notes (Signed)
Progress Note  Patient Name: Jill Hudson Date of Encounter: 04/16/2023  Primary Cardiologist: [facility]  Subjective   No angina. Dyspnea stable. Documented UOP 350 mL for the pasts 24 hours, net + 2.1 L for the admission. SCr 3.00 to 3.07, improved from a peak of 3.8 (baseline ~ 1.5). Declined PT yesterday due to fatigue. More alert today.  Followed by advanced heart failure.   Inpatient Medications    Scheduled Meds:  amiodarone  200 mg Oral Daily   apixaban  2.5 mg Oral BID   arformoterol  15 mcg Nebulization BID   aspirin EC  81 mg Oral Daily   carvedilol  3.125 mg Oral BID WC   Chlorhexidine Gluconate Cloth  6 each Topical Daily   Chlorhexidine Gluconate Cloth  6 each Topical Q0600   cyanocobalamin  1,000 mcg Oral Daily   feeding supplement (NEPRO CARB STEADY)  237 mL Oral BID BM   gabapentin  100 mg Oral QHS   hydrALAZINE  10 mg Oral Q8H   insulin aspart  0-15 Units Subcutaneous TID WC   insulin aspart  0-5 Units Subcutaneous QHS   insulin aspart  2 Units Subcutaneous TID WC   insulin glargine-yfgn  8 Units Subcutaneous QHS   isosorbide mononitrate  30 mg Oral Daily   multivitamin  1 tablet Oral BID   nystatin   Topical TID   polyethylene glycol  17 g Oral Daily   promethazine  6.25 mg Oral QHS   revefenacin  175 mcg Nebulization Daily   rosuvastatin  10 mg Oral Once per day on Mon Wed Fri Sat   sodium bicarbonate  650 mg Oral BID   torsemide  40 mg Oral Q M,W,F   Continuous Infusions:  sodium chloride Stopped (04/14/23 1008)   anticoagulant sodium citrate     PRN Meds: acetaminophen, ALPRAZolam, alteplase, alum & mag hydroxide-simeth, anticoagulant sodium citrate, dextromethorphan-guaiFENesin, heparin, hydrALAZINE, levalbuterol, lidocaine (PF), lidocaine-prilocaine, melatonin, ondansetron (ZOFRAN) IV, mouth rinse, oxyCODONE, pentafluoroprop-tetrafluoroeth, phenol, senna-docusate, torsemide, traZODone   Vital Signs    Vitals:   04/15/23 2333 04/16/23 0356 04/16/23  0840 04/16/23 1226  BP: (!) 132/55 (!) 122/45 (!) 129/48 (!) 129/43  Pulse: (!) 59 (!) 54 (!) 58 (!) 58  Resp: 16 16 18 20   Temp: 97.8 F (36.6 C) 98.2 F (36.8 C) 98.5 F (36.9 C) 98 F (36.7 C)  TempSrc: Oral Oral Oral Oral  SpO2: 100% 98% 100% 98%  Weight:   60.6 kg   Height:        Intake/Output Summary (Last 24 hours) at 04/16/2023 1240 Last data filed at 04/16/2023 1049 Gross per 24 hour  Intake 240 ml  Output 350 ml  Net -110 ml   Filed Weights   04/09/23 0730 04/13/23 0812 04/16/23 0840  Weight: 58.5 kg 58.6 kg 60.6 kg    Telemetry    Sinus bradycardia, 50s bpm - Personally Reviewed  ECG    No new tracings - Personally Reviewed  Physical Exam   GEN: No acute distress.   Neck: No JVD. Cardiac: RRR, II/VI systolic murmur LLSB, no rubs, or gallops.  Respiratory: Clear to auscultation bilaterally.  GI: Soft, nontender, non-distended.   MS: No edema; No deformity. Neuro:  Alert and oriented x 3; Nonfocal.  Psych: Normal affect.  Labs    Chemistry Recent Labs  Lab 04/14/23 0801 04/15/23 0832 04/16/23 0933  NA 136 134* 133*  K 4.8 4.8 4.6  CL 104 103 101  CO2 22  22 23  GLUCOSE 93 143* 122*  BUN 65* 63* 65*  CREATININE 3.13* 3.00* 3.07*  CALCIUM 8.2* 8.2* 8.3*  ALBUMIN  --  2.7*  --   GFRNONAA 14* 15* 14*  ANIONGAP 10 9 9      Hematology Recent Labs  Lab 04/12/23 0348 04/13/23 0437 04/14/23 0801  WBC 7.8 8.3 8.9  RBC 2.94* 3.13* 3.19*  HGB 8.1* 8.7* 8.8*  HCT 26.1* 27.7* 28.4*  MCV 88.8 88.5 89.0  MCH 27.6 27.8 27.6  MCHC 31.0 31.4 31.0  RDW 14.0 14.2 14.2  PLT 207 235 229    Cardiac EnzymesNo results for input(s): "TROPONINI" in the last 168 hours. No results for input(s): "TROPIPOC" in the last 168 hours.   BNPNo results for input(s): "BNP", "PROBNP" in the last 168 hours.   DDimer No results for input(s): "DDIMER" in the last 168 hours.   Radiology    No results found.  Cardiac Studies   Limited echo 04/02/2023: 1. Left  ventricular ejection fraction, by estimation, is 35 to 40%. The  left ventricle has moderately decreased function. The left ventricle  demonstrates global hypokinesis. Left ventricular diastolic parameters are  indeterminate.   2. Right ventricular systolic function is normal. The right ventricular  size is normal. Tricuspid regurgitation signal is inadequate for assessing  PA pressure.   3. Left atrial size was moderately dilated.   4. Moderate pleural effusion in the left lateral region.   5. The mitral valve is normal in structure. Moderate to severe mitral  valve regurgitation. No evidence of mitral stenosis.   6. The aortic valve is normal in structure. Aortic valve regurgitation is  not visualized. No aortic stenosis is present.   7. The inferior vena cava is normal in size with <50% respiratory  variability, suggesting right atrial pressure of 8 mmHg.  __________  2D echo 03/26/2023: 1. Left ventricular ejection fraction, by estimation, is 30 to 35%. The  left ventricle has moderately decreased function. Left ventricular  endocardial border not optimally defined to evaluate regional wall motion.  The left ventricular internal cavity  size was mildly dilated. Left ventricular diastolic parameters are  consistent with Grade II diastolic dysfunction (pseudonormalization).   2. Right ventricular systolic function is normal. The right ventricular  size is normal. There is mildly elevated pulmonary artery systolic  pressure.   3. Left atrial size was mildly dilated.   4. The mitral valve is degenerative. Severe mitral valve regurgitation.  No evidence of mitral stenosis.   5. The aortic valve is normal in structure. Aortic valve regurgitation is  not visualized. Aortic valve sclerosis/calcification is present, without  any evidence of aortic stenosis.   6. The inferior vena cava is normal in size with <50% respiratory  variability, suggesting right atrial pressure of 8 mmHg.     Patient Profile     88 y.o. female with history of diabetes mellitus type II, hypertension, coronary artery disease, peripheral arterial disease, tremor, CVA, congestive heart failure and anemia who is admitted to [facility] on 03/25/2023 for COPD exacerbation complicated by acute on chronic HFrEF and NSTEMI.   Assessment & Plan    1. Acute systolic HF with cardiogenic shock and mitral regurgitation  -Followed by advanced heart failure service - Echo 2023 EF 55-60% - Echo this admit read as EF 30-35%. RV ok  -Per Dr Gala Romney EF 35-40% there is severe HK of basaliar to mid lateral wall and inferolateral wall - High-sensitivity troponin 225 -> 458 - >1186 ->  1165.  BNP 971  - Suspect this is an ischemic CM  - Developed low output HF and elevated volume status. Initial co-ox 44% CVP 21 (03/29/23) - She has severe MR; only option for treatment is medical management  - Continue hydralazine/Imdur and low-dose carvedilol - No ACEi/ARB/ARNI or MRA with CKD IV - Volume status,and resp status now improved with torsemide 40mg  - would consider using torsemide 40mg  MWF + PRN  for now unless HD is being entertained   2. NSTEMI - Echo as above with RWMA - No angina - Continue ASA/statin - With persistent renal failure and no current angina will defer cath    3. AKI on CKD 3a - Likely ATN/cardiorenal  - Baseline ~ 1.5 - Scr 1.5 -> 2.8 -> 2.7 ->->-> 3.78 -> 3.64 -> 3.3 -> 3.13->3.00->3.07 - 6/6 renal u/s - no hydronephrosis - Renal following   4. Acute hypoxic respiratory failure - Likely due primarily to HF and pleural effusions on top of underlying COPD - S/p R thora on 6/3 with 700cc removed - 6/7 Bilateral pleural effsions - Improving   5. PAF  - Back in NSR  - Initially on amio drip. Now on amio po 200 mg daily  - On Eliquis 2.5 mg twice a day. Reduced dose with age and creatinine   6. Hypervolemic hyponatremia - Resolved   For questions or updates, please contact [facility] Please consult [email or web address] for contact info under Cardiology/STEMI.    Signed, Eula Listen, PA-C [facility] Pager: [phone] 04/16/2023, 12:40 PM

## 2023-04-16 NOTE — Progress Notes (Signed)
Patient states his abdominal adjustments at all level. [facility]  ROUNDING NOTE   Subjective:   [name] was admitted to [facility] on 03/25/2023 for COPD exacerbation (HCC) [J44.1] Chest pain [R07.9] Community acquired pneumonia, unspecified laterality [J18.9]  Patient seen laying in bed No family present States she feels well Appetite remains poor   UOP Creatinine 3.07  Objective:  Vital signs in last 24 hours:  Temp:  [97.8 F (36.6 C)-98.5 F (36.9 C)] 98 F (36.7 C) (06/21 1226) Pulse Rate:  [54-59] 58 (06/21 1226) Resp:  [16-20] 20 (06/21 1226) BP: (122-132)/(43-55) 129/43 (06/21 1226) SpO2:  [95 %-100 %] 98 % (06/21 1226) Weight:  [60.6 kg] 60.6 kg (06/21 0840)  Weight change:  Filed Weights   04/09/23 0730 04/13/23 0812 04/16/23 0840  Weight: 58.5 kg 58.6 kg 60.6 kg    Intake/Output: I/O last 3 completed shifts: In: 820 [P.O.:820] Out: 650 [Urine:650]   Intake/Output this shift:  Total I/O In: 240 [P.O.:240] Out: -   Physical Exam: General: NAD, laying in bed  Head: Normocephalic, atraumatic. Moist oral mucosal membranes  Eyes: Anicteric  Lungs:  Diminished in bases, room air  Heart: regular  Abdomen:  Soft, nontender  Extremities:  No peripheral edema.  Neurologic: Alert and oriented to self, moving all four extremities  Skin: warm        Basic Metabolic Panel: Recent Labs  Lab 04/10/23 0529 04/11/23 0424 04/12/23 0348 04/13/23 0437 04/14/23 0801 04/15/23 0832 04/16/23 0933  NA 133* 131* 131* 130* 136 134* 133*  K 4.8 4.8 4.9 4.5 4.8 4.8 4.6  CL 102 99 101 101 104 103 101  CO2 21* 21* 19* 18* 22 22 23   GLUCOSE 86 123* 107* 108* 93 143* 122*  BUN 80* 78* 78* 68* 65* 63* 65*  CREATININE 3.80* 3.78* 3.64* 3.33* 3.13* 3.00* 3.07*  CALCIUM 8.5* 8.5* 8.0* 8.2* 8.2* 8.2* 8.3*  MG 2.4 2.4 2.3  --   --   --   --   PHOS 4.9* 5.0* 4.9*  --   --  4.4  --      Liver Function Tests: Recent Labs  Lab 04/15/23 0832   ALBUMIN 2.7*    No results for input(s): "LIPASE", "AMYLASE" in the last 168 hours. No results for input(s): "AMMONIA" in the last 168 hours.  CBC: Recent Labs  Lab 04/10/23 0529 04/11/23 0424 04/12/23 0348 04/13/23 0437 04/14/23 0801  WBC 8.3 9.7 7.8 8.3 8.9  HGB 8.5* 8.9* 8.1* 8.7* 8.8*  HCT 26.8* 28.8* 26.1* 27.7* 28.4*  MCV 88.7 89.4 88.8 88.5 89.0  PLT 208 243 207 235 229     Cardiac Enzymes: No results for input(s): "CKTOTAL", "CKMB", "CKMBINDEX", "TROPONINI" in the last 168 hours.  BNP: Invalid input(s): "POCBNP"  CBG: Recent Labs  Lab 04/15/23 1630 04/15/23 1952 04/15/23 2106 04/16/23 0837 04/16/23 1223  GLUCAP 152* 109* 97 106* 192*     Microbiology: Results for orders placed or performed during the hospital encounter of 03/25/23  Blood Culture (routine x 2)     Status: None   Collection Time: 03/25/23 12:09 PM   Specimen: BLOOD  Result Value Ref Range Status   Specimen Description BLOOD LEFT [facility]  Final   Special Requests   Final    BOTTLES DRAWN AEROBIC AND ANAEROBIC Blood Culture adequate volume   Culture   Final    NO GROWTH 5 DAYS Performed at [facility], [address] [place], [place]  [address]    Report Status 03/30/2023 FINAL  Final  Blood Culture (routine x 2)     Status: None   Collection Time: 03/25/23 12:10 PM   Specimen: BLOOD  Result Value Ref Range Status   Specimen Description BLOOD LEFT FA  Final   Special Requests   Final    BOTTLES DRAWN AEROBIC AND ANAEROBIC Blood Culture adequate volume   Culture   Final    NO GROWTH 5 DAYS Performed at [facility], [address] [place], [place] [address]    Report Status 03/30/2023 FINAL  Final  SARS Coronavirus 2 by RT PCR (hospital order, performed in [facility] hospital lab) *cepheid single result test* Anterior Nasal Swab     Status: None   Collection Time: 03/25/23  1:18 PM   Specimen: Anterior Nasal Swab  Result Value Ref Range Status   SARS  Coronavirus 2 by RT PCR NEGATIVE NEGATIVE Final    Comment: (NOTE) SARS-CoV-2 target nucleic acids are NOT DETECTED.  The SARS-CoV-2 RNA is generally detectable in upper and lower respiratory specimens during the acute phase of infection. The lowest concentration of SARS-CoV-2 viral copies this assay can detect is 250 copies / mL. A negative result does not preclude SARS-CoV-2 infection and should not be used as the sole basis for treatment or other patient management decisions.  A negative result may occur with improper specimen collection / handling, submission of specimen other than nasopharyngeal swab, presence of viral mutation(s) within the areas targeted by this assay, and inadequate number of viral copies (<250 copies / mL). A negative result must be combined with clinical observations, patient history, and epidemiological information.  Fact Sheet for Patients:   RoadLapTop.co.za  Fact Sheet for Healthcare Providers: http://kim-miller.com/  This test is not yet approved or  cleared by the [place] FDA and has been authorized for detection and/or diagnosis of SARS-CoV-2 by FDA under an Emergency Use Authorization (EUA).  This EUA will remain in effect (meaning this test can be used) for the duration of the COVID-19 declaration under Section 564(b)(1) of the Act, 21 U.S.C. section 360bbb-3(b)(1), unless the authorization is terminated or revoked sooner.  Performed at [facility], [address] [place], [place] [address]   MRSA Next Gen by PCR, Nasal     Status: None   Collection Time: 03/27/23  4:40 AM   Specimen: Nasal Mucosa; Nasal Swab  Result Value Ref Range Status   MRSA by PCR Next Gen NOT DETECTED NOT DETECTED Final    Comment: (NOTE) The GeneXpert MRSA Assay (FDA approved for NASAL specimens only), is one component of a comprehensive MRSA colonization surveillance program. It is not intended to  diagnose MRSA infection nor to guide or monitor treatment for MRSA infections. Test performance is not FDA approved in patients less than 43 years old. Performed at [facility], [address] [place], [place] [address]   Respiratory (~20 pathogens) panel by PCR     Status: None   Collection Time: 03/28/23  4:00 PM   Specimen: Nasopharyngeal Swab; Respiratory  Result Value Ref Range Status   Adenovirus NOT DETECTED NOT DETECTED Final   Coronavirus 229E NOT DETECTED NOT DETECTED Final    Comment: (NOTE) The Coronavirus on the Respiratory Panel, DOES NOT test for the novel  Coronavirus (2019 nCoV)    Coronavirus HKU1 NOT DETECTED NOT DETECTED Final   Coronavirus NL63 NOT DETECTED NOT DETECTED Final   Coronavirus OC43 NOT DETECTED NOT DETECTED Final  Metapneumovirus NOT DETECTED NOT DETECTED Final   Rhinovirus / Enterovirus NOT DETECTED NOT DETECTED Final   Influenza A NOT DETECTED NOT DETECTED Final   Influenza B NOT DETECTED NOT DETECTED Final   Parainfluenza Virus 1 NOT DETECTED NOT DETECTED Final   Parainfluenza Virus 2 NOT DETECTED NOT DETECTED Final   Parainfluenza Virus 3 NOT DETECTED NOT DETECTED Final   Parainfluenza Virus 4 NOT DETECTED NOT DETECTED Final   Respiratory Syncytial Virus NOT DETECTED NOT DETECTED Final   Bordetella pertussis NOT DETECTED NOT DETECTED Final   Bordetella Parapertussis NOT DETECTED NOT DETECTED Final   Chlamydophila pneumoniae NOT DETECTED NOT DETECTED Final   Mycoplasma pneumoniae NOT DETECTED NOT DETECTED Final    Comment: Performed at [facility], [address], [place], [place] [address]  Body fluid culture w Gram Stain     Status: None   Collection Time: 03/29/23 10:27 AM   Specimen: PATH Cytology Pleural fluid  Result Value Ref Range Status   Specimen Description   Final    PLEURAL Performed at [facility], [address], [place], [place] [address]    Special Requests   Final    NONE Performed at  [facility], [address] [place], [place] [address]    Gram Stain NO WBC SEEN NO ORGANISMS SEEN   Final   Culture   Final    NO GROWTH 3 DAYS Performed at [facility], [address], [place], [place] [address]    Report Status 04/01/2023 FINAL  Final  MRSA Next Gen by PCR, Nasal     Status: None   Collection Time: 03/29/23  3:00 PM   Specimen: Nasal Mucosa; Nasal Swab  Result Value Ref Range Status   MRSA by PCR Next Gen NOT DETECTED NOT DETECTED Final    Comment: (NOTE) The GeneXpert MRSA Assay (FDA approved for NASAL specimens only), is one component of a comprehensive MRSA colonization surveillance program. It is not intended to diagnose MRSA infection nor to guide or monitor treatment for MRSA infections. Test performance is not FDA approved in patients less than 32 years old. Performed at [facility], [address] [place], [place] [address]   Group A Strep by PCR     Status: None   Collection Time: 03/30/23  6:00 PM   Specimen: Throat; Sterile Swab  Result Value Ref Range Status   Group A Strep by PCR NOT DETECTED NOT DETECTED Final    Comment: Performed at [facility], [address] [place], [place] [address]    Coagulation Studies: No results for input(s): "LABPROT", "INR" in the last 72 hours.   Urinalysis: No results for input(s): "COLORURINE", "LABSPEC", "PHURINE", "GLUCOSEU", "HGBUR", "BILIRUBINUR", "KETONESUR", "PROTEINUR", "UROBILINOGEN", "NITRITE", "LEUKOCYTESUR" in the last 72 hours.  Invalid input(s): "APPERANCEUR"    Imaging: No results found.   Medications:    sodium chloride Stopped (04/14/23 1008)   anticoagulant sodium citrate       amiodarone  200 mg Oral Daily   apixaban  2.5 mg Oral BID   arformoterol  15 mcg Nebulization BID   aspirin EC  81 mg Oral Daily   carvedilol  3.125 mg Oral BID WC   Chlorhexidine Gluconate Cloth  6 each Topical Daily   Chlorhexidine Gluconate Cloth  6 each  Topical Q0600   cyanocobalamin  1,000 mcg Oral Daily   feeding supplement (NEPRO CARB STEADY)  237 mL Oral BID BM   gabapentin  100  mg Oral QHS   hydrALAZINE  10 mg Oral Q8H   insulin aspart  0-15 Units Subcutaneous TID WC   insulin aspart  0-5 Units Subcutaneous QHS   insulin aspart  2 Units Subcutaneous TID WC   insulin glargine-yfgn  8 Units Subcutaneous QHS   isosorbide mononitrate  30 mg Oral Daily   multivitamin  1 tablet Oral BID   nystatin   Topical TID   polyethylene glycol  17 g Oral Daily   promethazine  6.25 mg Oral QHS   revefenacin  175 mcg Nebulization Daily   rosuvastatin  10 mg Oral Once per day on Mon Wed Fri Sat   sodium bicarbonate  650 mg Oral BID   torsemide  40 mg Oral Q M,W,F   acetaminophen, ALPRAZolam, alteplase, alum & mag hydroxide-simeth, anticoagulant sodium citrate, dextromethorphan-guaiFENesin, heparin, hydrALAZINE, levalbuterol, lidocaine (PF), lidocaine-prilocaine, melatonin, ondansetron (ZOFRAN) IV, mouth rinse, oxyCODONE, pentafluoroprop-tetrafluoroeth, phenol, senna-docusate, torsemide, traZODone  Assessment/ Plan:  Ms. JOLANTA CABEZA is a 88 y.o.  female with diabetes mellitus type II, hypertension, coronary artery disease, peripheral arterial disease, tremor, CVA, congestive heart failure , anxiety, peripheral arterial diease and anemia who is admitted to [facility] on 03/25/2023 for COPD exacerbation (HCC) [J44.1] Chest pain [R07.9] Community acquired pneumonia, unspecified laterality [J18.9]  Acute kidney injury with acute metabolic acidosis on chronic kidney disease stage IIIB: baseline creatinine of 1.48, GFR of 34 on 12/09/2022. Acute kidney injury secondary to acute cardiorenal syndrome . Chronic kidney disease secondary to hypertension.  Creatinine stable and did not improve much with diuresis. Will continue to hold dialysis per cardiology request and continue to monitor renal function with diuretic use. Patient is very high risk for re-admission and  will likely require renal replacement therapy in the near future.    Hypertension with chronic kidney disease: with chronic systolic and diastolic congestive heart failure.  Appreciate cardiology input.  Heart failure regimen includes isosorbide/hydralazine and carvedilol Blood pressure remains stable Torsemide 40mg  three times weekly and PRN  Hyponatremia: Sodium 133 Encouraged to increase oral intake  4.  Hypokalemia Potassium corrected     LOS: 22   6/21/20241:00 PM

## 2023-04-16 NOTE — TOC Progression Note (Signed)
Transition of Care [facility]) - Progression Note    Patient Details  Name: Jill Hudson MRN: [id number] Date of Birth: 29-Mar-1935  Transition of Care [facility] CM/SW Contact  Liliana Cline, LCSW Phone Number: 04/16/2023, 9:35 AM  Clinical Narrative:    Plan for [facility] for STR when medically ready per chart review. Patient will need new insurance auth when appropriate. Current auth expired on 6/19.    Expected Discharge Plan: Skilled Nursing Facility Barriers to Discharge: Continued Medical Work up  Expected Discharge Plan and Services       Living arrangements for the past 2 months: Single Family Home                                       Social Determinants of Health (SDOH) Interventions SDOH Screenings   Food Insecurity: No Food Insecurity (03/25/2023)  Housing: Low Risk  (03/25/2023)  Transportation Needs: No Transportation Needs (03/25/2023)  Utilities: Not At Risk (03/25/2023)  Alcohol Screen: Low Risk  (03/11/2022)  Depression (PHQ2-9): High Risk (01/01/2023)  Financial Resource Strain: Low Risk  (01/01/2023)  Physical Activity: Insufficiently Active (03/11/2022)  Social Connections: Socially Integrated (05/12/2022)  Stress: No Stress Concern Present (03/11/2022)  Tobacco Use: Medium Risk (03/25/2023)    Readmission Risk Interventions     No data to display

## 2023-04-17 ENCOUNTER — Inpatient Hospital Stay: Payer: Medicare HMO

## 2023-04-17 DIAGNOSIS — R570 Cardiogenic shock: Secondary | ICD-10-CM | POA: Diagnosis not present

## 2023-04-17 DIAGNOSIS — I214 Non-ST elevation (NSTEMI) myocardial infarction: Secondary | ICD-10-CM | POA: Diagnosis not present

## 2023-04-17 DIAGNOSIS — J441 Chronic obstructive pulmonary disease with (acute) exacerbation: Secondary | ICD-10-CM | POA: Diagnosis not present

## 2023-04-17 DIAGNOSIS — J189 Pneumonia, unspecified organism: Secondary | ICD-10-CM | POA: Diagnosis not present

## 2023-04-17 DIAGNOSIS — J90 Pleural effusion, not elsewhere classified: Secondary | ICD-10-CM | POA: Diagnosis not present

## 2023-04-17 DIAGNOSIS — I5023 Acute on chronic systolic (congestive) heart failure: Secondary | ICD-10-CM

## 2023-04-17 LAB — GLUCOSE, CAPILLARY
Glucose-Capillary: 104 mg/dL — ABNORMAL HIGH (ref 70–99)
Glucose-Capillary: 195 mg/dL — ABNORMAL HIGH (ref 70–99)
Glucose-Capillary: 137 mg/dL — ABNORMAL HIGH (ref 70–99)
Glucose-Capillary: 137 mg/dL — ABNORMAL HIGH (ref 70–99)

## 2023-04-17 LAB — BLOOD GAS, ARTERIAL
Acid-base deficit: 1.2 mmol/L (ref 0.0–2.0)
Bicarbonate: 23.6 mmol/L (ref 20.0–28.0)
O2 Content: 12 L/min
O2 Saturation: 91.8 %
Patient temperature: 37
pCO2 arterial: 39 mmHg (ref 32–48)
pH, Arterial: 7.39 (ref 7.35–7.45)
pO2, Arterial: 59 mmHg — ABNORMAL LOW (ref 83–108)

## 2023-04-17 LAB — MAGNESIUM: Magnesium: 2.4 mg/dL (ref 1.7–2.4)

## 2023-04-17 MED ORDER — FUROSEMIDE 10 MG/ML IJ SOLN
60.0000 mg | Freq: Once | INTRAMUSCULAR | Status: AC
  Administered 2023-04-17: 60 mg via INTRAVENOUS
  Filled 2023-04-17: qty 6

## 2023-04-17 MED ORDER — FUROSEMIDE 10 MG/ML IJ SOLN: 40.0000 mg | Freq: Once | INTRAMUSCULAR | Status: DC

## 2023-04-17 MED ORDER — SALINE SPRAY 0.65 % NA SOLN
1.0000 | NASAL | Status: DC | PRN
  Administered 2023-04-17: 1 via NASAL
  Filled 2023-04-17: qty 44

## 2023-04-17 NOTE — Progress Notes (Signed)
Patient's urine was pink and cloudy, MD made aware of, MD said that's because she pulled her foley and flush it, flushed the Foley's.

## 2023-04-17 NOTE — Progress Notes (Signed)
Daily Progress Note   Patient Name: Jill Hudson       Date: 04/17/2023 DOB: 1934/11/07  Age: 88 y.o. MRN#: [id number] Attending Physician: Enedina Finner, MD Primary Care Physician: Reubin Milan, MD Admit Date: 03/25/2023  Reason for Consultation/Follow-up: Establishing goals of care  HPI/Brief Hospital Review: 88 year old female with history of COPD, HTN, HLD, DM2, stroke, TIA, GERD, anxiety, PVD, CAD, MI, former tobacco use, CKD stage III, GI bleed, iron deficiency anemia, PAD status post PCI admitted on 03/25/2023 with shortness of breath and hypoxia. Patient was found to have bilateral pulmonary edema /effusion with elevated BNP.   Hospital course has been complicated by worsening renal function-concern for need for HD.  Palliative Medicine consulted for assisting with goals of care conversations.  Subjective: Extensive chart review has been completed prior to meeting patient including labs, vital signs, imaging, progress notes, orders, and available advanced directive documents from current and previous encounters.    Visited with Jill Hudson at her bedside. Awake and alert, able to answer simple orientation questions. Daughter-Sarah at bedside.  We discussed ongoing concern related to renal functions. As noted, family needed time for processing and further conversations amongst themselves. At this time Jill Hudson is at a low threshold for needing dialysis. Last creatinine level with slight bump at 3.07 (previous 3.00).  Maralyn Sago shares her concerns of putting her mom through the stress of dialysis. Maralyn Sago shares she has had little opportunity to speak with her brother but she feels he is on the same page. They both continue to hope for improvement but are leaning towards avoiding dialysis. We  discussed if decision made that dialysis is needed and decision is made not to pursue conversations need to be had at that time regarding goals of care.  Answered and addressed all questions and concerns. PMT to continue to follow for ongoing needs and support.    Assessment/Recommendation/Plan  DNR/DNI Leaning towards not pursuing dialysis if needed/recommended PMT to continue to follow for ongoing needs and support  Thank you for allowing the Palliative Medicine Team to assist in the care of this patient.  Total time:  25 minutes  Time spent includes: Detailed review of medical records (labs, imaging, vital signs), medically appropriate exam (mental status, respiratory, cardiac, skin), discussed with treatment team, counseling and educating patient, family and  staff, documenting clinical information, medication management and coordination of care.  Leeanne Deed, DNP, AGNP-C Palliative Medicine   Please contact Palliative Medicine Team phone at [phone] for questions and concerns.

## 2023-04-17 NOTE — Progress Notes (Signed)
Pt requesting medication for nose.  Medicated per MAR.  This RN noticed a change in patient's respiratory status.  Pt tachypneic and upon auscultation, pt's breathing in chest tight.  Pt audibly wheezing.  RT paged to give breathing treatment.  Pt was found to be 87% on 2LNC.  Put up to [facility] with no change in O2 sat.  Pt placed on NRB for bed change and o2 came up to 98%.  RT to give breathing treatment and this RN will notify Dr. Para March of respiratory changes to see if any other new orders are warranted.

## 2023-04-17 NOTE — Progress Notes (Signed)
[facility]  - [place] at [facility]   PATIENT NAME: Jill Hudson    MR#:  [id number]  DATE OF BIRTH:  30-Apr-1935  SUBJECTIVE:   Patient seen earlier. No family at bedside during my visit. Patient remains with poor appetite. She says she is trying to eat. Drinks protein shakes. Urine output 1100  mL yesterday. HD trial placed on hold--resumed po torsemide per Cardiology MWF and prn  VITALS:  Blood pressure 134/78, pulse 60, temperature 97.8 F (36.6 C), resp. rate 16, height 5' (1.524 m), weight 62.5 kg, SpO2 100 %.  PHYSICAL EXAMINATION:   GENERAL:  88 y.o.-year-old patient with no acute distress. Weak, deconditioned LUNGS: Normal breath sounds bilaterally, no wheezing CARDIOVASCULAR: S1, S2 normal. No murmur   ABDOMEN: Soft, nontender, nondistended. Bowel sounds present. FOLEY+ EXTREMITIES: trace edema b/l.    NEUROLOGIC: nonfocal  patient is alert and awake, weak .   LABORATORY PANEL:  CBC Recent Labs  Lab 04/14/23 0801  WBC 8.9  HGB 8.8*  HCT 28.4*  PLT 229     Chemistries  Recent Labs  Lab 04/16/23 0933 04/17/23 1055  NA 133*  --   K 4.6  --   CL 101  --   CO2 23  --   GLUCOSE 122*  --   BUN 65*  --   CREATININE 3.07*  --   CALCIUM 8.3*  --   MG  --  2.4    RADIOLOGY:  No results found.  Assessment and Plan  88 year old female with history of COPD, HTN, HLD, DM2, stroke, TIA, GERD, anxiety, PVD, CAD, MI, former tobacco use, CKD stage III, GI bleed, iron deficiency anemia, PAD status post PCI comes to the hospital with shortness of breath and hypoxia. Patient was found to have bilateral pulmonary edema /effusion with elevated BNP.      Assessment and Plan:  Acute on chronic systolic CHF (congestive heart failure) (HCC) Acute respiratory failure with hypoxia (HCC) Ischemic cardiomyopathy --Patient initially requiring BiPAP secondary to respiratory distress.  S/p supplemental O2 inhalation, gradually weaned off currently on room  air. --cont hydralazine, Imdur and low-dose Coreg.  No ACE/ARB or spironolactone with her worsening kidney function. --6/18--prn torsemide for now given worsening creat --6/19--seen by Cardiology--recommends torsemide 40 mg MWF and prn. HOld HD for now. Creat 3.13 --6/20-- creat 3.0 cont with good UOP --6/21--uop 350 cc , appears Euvolemic. Creat 3.07. [facility] cardiology signed off --6/22--UOP 1100 cc creat at 3.0. d/w Dr. Wynelle Link who is good no discussed with family regarding considering dialysis  Cardiogenic shock [facility]) --Cardiology took off milrinone drip 6/7.  Not on any pressors at this point.   Paroxysmal atrial fibrillation (HCC) --Patient converted to normal sinus rhythm.  Taken off amiodarone drip and switched to oral on 6/7. --  Heparin drip converted over to Eliquis on 6/7. --Patient is on amiodarone 200 mg p.o.daily   Acute kidney injury superimposed on CKD (HCC) --AKI on CKD stage IIIa.  Creatinine now mid-3s --Nephrology Dr Cherylann Ratel discussed with patient's daughter and they are in agreement with hemodialysis. --Scr 1.5 -> 2.8 -> 2.7 ->->-> 3.78 -> 3.64 -> 3.3 -> 3.13 >>3.0>>3.07 -- Vascular consultation placed --holding Perm cath placement for now  Acute urinary retention --Today patient reports that most of this hospitalization unable to urinate on her own, only with valsalva. Bladder scan today shows retention of 445 ml. Pt had in and out before and had a foley also eariler in the admission --Renal u/s no evidence  of hydronephrosis. Shows bladder post void volume around 300. -- Patient unable to void. Bladder scan again today showed around 600 mL. Discussed with Dr. Richardo Hanks at present given no other option will place Foley catheter and have patient follow-up urology as outpatient for emptying study. --good UOP after Foley placement  NSTEMI (non-ST elevated myocardial infarction) (HCC) -- Continue aspirin, Imdur, Coreg and low-dose Crestor.   Type II diabetes mellitus with  renal manifestations (HCC) --Last hemoglobin A1c 7.9.  Hypoglycemic episode the other morning.  Decreased Semglee insulin down to 8 units at night.   --Continue sliding scale.   COPD exacerbation (HCC) --Continue nebulizers.   Essential hypertension --Continue hydralazine, Imdur and low-dose Coreg.   History of CVA (cerebrovascular accident) --Patient on Eliquis for stroke prevention   HLD (hyperlipidemia) --On Crestor   chronic anemia --Stable. Last hemoglobin 8.8.     Pleural effusion Right thoracentesis removed 700 mL of fluid, transudate.  This was done by interventional radiology on 6/3.   Body mass index is 25.19 kg/m.  Interventions:  Long term poor prognosis Consults : palliative care, [facility] cardiology and nephrology   Diet: Heart healthy diet DVT Prophylaxis: Therapeutic Anticoagulation with Eliquis     Advance goals of care discussion: DNR   Family Communication:  none today     TOTAL TIME TAKING CARE OF THIS PATIENT: 35 minutes.  >50% time spent on counselling and coordination of care  Note: This dictation was prepared with Dragon dictation along with smaller phrase technology. Any transcriptional errors that result from this process are unintentional.  Enedina Finner M.D    [facility]   CC: Primary care physician; Reubin Milan, MD

## 2023-04-17 NOTE — Progress Notes (Signed)
Patient states his abdominal adjustments at all level. [facility]  ROUNDING NOTE   Subjective:   Ms. EVALENA FUJII was admitted to [facility] on 03/25/2023 for COPD exacerbation (HCC) [J44.1] Chest pain [R07.9] Community acquired pneumonia, unspecified laterality [J18.9]  UOP - torsemide 40mg  PO given yesterday.   No new labwork for today.   Objective:  Vital signs in last 24 hours:  Temp:  [97.8 F (36.6 C)-98.8 F (37.1 C)] 98.8 F (37.1 C) (06/22 1310) Pulse Rate:  [53-60] 59 (06/22 1310) Resp:  [16-18] 16 (06/22 1310) BP: (92-141)/(51-78) 92/71 (06/22 1310) SpO2:  [96 %-100 %] 96 % (06/22 1310) Weight:  [62.5 kg] 62.5 kg (06/22 0500)  Weight change:  Filed Weights   04/13/23 0812 04/16/23 0840 04/17/23 0500  Weight: 58.6 kg 60.6 kg 62.5 kg    Intake/Output: I/O last 3 completed shifts: In: 240 [P.O.:240] Out: 1300 [Urine:1300]   Intake/Output this shift:  Total I/O In: 400 [P.O.:400] Out: -   Physical Exam: General: NAD, laying in bed  Head: Normocephalic, atraumatic. Moist oral mucosal membranes  Eyes: Anicteric  Lungs:  Diminished in bases, room air  Heart: regular  Abdomen:  Soft, nontender  Extremities:  No peripheral edema.  Neurologic: Alert and oriented to self, moving all four extremities  Skin: warm   Access:  none    Basic Metabolic Panel: Recent Labs  Lab 04/11/23 0424 04/12/23 0348 04/13/23 0437 04/14/23 0801 04/15/23 0832 04/16/23 0933 04/17/23 1055  NA 131* 131* 130* 136 134* 133*  --   K 4.8 4.9 4.5 4.8 4.8 4.6  --   CL 99 101 101 104 103 101  --   CO2 21* 19* 18* 22 22 23   --   GLUCOSE 123* 107* 108* 93 143* 122*  --   BUN 78* 78* 68* 65* 63* 65*  --   CREATININE 3.78* 3.64* 3.33* 3.13* 3.00* 3.07*  --   CALCIUM 8.5* 8.0* 8.2* 8.2* 8.2* 8.3*  --   MG 2.4 2.3  --   --   --   --  2.4  PHOS 5.0* 4.9*  --   --  4.4  --   --      Liver Function Tests: Recent Labs  Lab 04/15/23 0832  ALBUMIN 2.7*    No  results for input(s): "LIPASE", "AMYLASE" in the last 168 hours. No results for input(s): "AMMONIA" in the last 168 hours.  CBC: Recent Labs  Lab 04/11/23 0424 04/12/23 0348 04/13/23 0437 04/14/23 0801  WBC 9.7 7.8 8.3 8.9  HGB 8.9* 8.1* 8.7* 8.8*  HCT 28.8* 26.1* 27.7* 28.4*  MCV 89.4 88.8 88.5 89.0  PLT 243 207 235 229     Cardiac Enzymes: No results for input(s): "CKTOTAL", "CKMB", "CKMBINDEX", "TROPONINI" in the last 168 hours.  BNP: Invalid input(s): "POCBNP"  CBG: Recent Labs  Lab 04/16/23 1223 04/16/23 1628 04/16/23 2211 04/17/23 0827 04/17/23 1315  GLUCAP 192* 152* 160* 104* 195*     Microbiology: Results for orders placed or performed during the hospital encounter of 03/25/23  Blood Culture (routine x 2)     Status: None   Collection Time: 03/25/23 12:09 PM   Specimen: BLOOD  Result Value Ref Range Status   Specimen Description BLOOD LEFT [facility]  Final   Special Requests   Final    BOTTLES DRAWN AEROBIC AND ANAEROBIC Blood Culture adequate volume   Culture   Final    NO GROWTH 5 DAYS Performed at [facility], [address] [place], [place] [address]    Report Status 03/30/2023 FINAL  Final  Blood Culture (routine x 2)     Status: None   Collection Time: 03/25/23 12:10 PM   Specimen: BLOOD  Result Value Ref Range Status   Specimen Description BLOOD LEFT FA  Final   Special Requests   Final    BOTTLES DRAWN AEROBIC AND ANAEROBIC Blood Culture adequate volume   Culture   Final    NO GROWTH 5 DAYS Performed at [facility], [address] [place], [place] [address]    Report Status 03/30/2023 FINAL  Final  SARS Coronavirus 2 by RT PCR (hospital order, performed in [facility] hospital lab) *cepheid single result test* Anterior Nasal Swab     Status: None   Collection Time: 03/25/23  1:18 PM   Specimen: Anterior Nasal Swab  Result Value Ref Range Status   SARS Coronavirus 2 by RT PCR NEGATIVE NEGATIVE Final    Comment:  (NOTE) SARS-CoV-2 target nucleic acids are NOT DETECTED.  The SARS-CoV-2 RNA is generally detectable in upper and lower respiratory specimens during the acute phase of infection. The lowest concentration of SARS-CoV-2 viral copies this assay can detect is 250 copies / mL. A negative result does not preclude SARS-CoV-2 infection and should not be used as the sole basis for treatment or other patient management decisions.  A negative result may occur with improper specimen collection / handling, submission of specimen other than nasopharyngeal swab, presence of viral mutation(s) within the areas targeted by this assay, and inadequate number of viral copies (<250 copies / mL). A negative result must be combined with clinical observations, patient history, and epidemiological information.  Fact Sheet for Patients:   RoadLapTop.co.za  Fact Sheet for Healthcare Providers: http://kim-miller.com/  This test is not yet approved or  cleared by the [place] FDA and has been authorized for detection and/or diagnosis of SARS-CoV-2 by FDA under an Emergency Use Authorization (EUA).  This EUA will remain in effect (meaning this test can be used) for the duration of the COVID-19 declaration under Section 564(b)(1) of the Act, 21 U.S.C. section 360bbb-3(b)(1), unless the authorization is terminated or revoked sooner.  Performed at [facility], [address] [place], [place] [address]   MRSA Next Gen by PCR, Nasal     Status: None   Collection Time: 03/27/23  4:40 AM   Specimen: Nasal Mucosa; Nasal Swab  Result Value Ref Range Status   MRSA by PCR Next Gen NOT DETECTED NOT DETECTED Final    Comment: (NOTE) The GeneXpert MRSA Assay (FDA approved for NASAL specimens only), is one component of a comprehensive MRSA colonization surveillance program. It is not intended to diagnose MRSA infection nor to guide or monitor treatment for MRSA  infections. Test performance is not FDA approved in patients less than 109 years old. Performed at [facility], [address] [place], [place] [address]   Respiratory (~20 pathogens) panel by PCR     Status: None   Collection Time: 03/28/23  4:00 PM   Specimen: Nasopharyngeal Swab; Respiratory  Result Value Ref Range Status   Adenovirus NOT DETECTED NOT DETECTED Final   Coronavirus 229E NOT DETECTED NOT DETECTED Final    Comment: (NOTE) The Coronavirus on the Respiratory Panel, DOES NOT test for the novel  Coronavirus (2019 nCoV)    Coronavirus HKU1 NOT DETECTED NOT DETECTED Final   Coronavirus NL63 NOT DETECTED NOT DETECTED Final  Coronavirus OC43 NOT DETECTED NOT DETECTED Final   Metapneumovirus NOT DETECTED NOT DETECTED Final   Rhinovirus / Enterovirus NOT DETECTED NOT DETECTED Final   Influenza A NOT DETECTED NOT DETECTED Final   Influenza B NOT DETECTED NOT DETECTED Final   Parainfluenza Virus 1 NOT DETECTED NOT DETECTED Final   Parainfluenza Virus 2 NOT DETECTED NOT DETECTED Final   Parainfluenza Virus 3 NOT DETECTED NOT DETECTED Final   Parainfluenza Virus 4 NOT DETECTED NOT DETECTED Final   Respiratory Syncytial Virus NOT DETECTED NOT DETECTED Final   Bordetella pertussis NOT DETECTED NOT DETECTED Final   Bordetella Parapertussis NOT DETECTED NOT DETECTED Final   Chlamydophila pneumoniae NOT DETECTED NOT DETECTED Final   Mycoplasma pneumoniae NOT DETECTED NOT DETECTED Final    Comment: Performed at [facility], [address], [place], [place] [address]  Body fluid culture w Gram Stain     Status: None   Collection Time: 03/29/23 10:27 AM   Specimen: PATH Cytology Pleural fluid  Result Value Ref Range Status   Specimen Description   Final    PLEURAL Performed at [facility], [address], [place], [place] [address]    Special Requests   Final    NONE Performed at [facility] [address] [place], [place] [address]     Gram Stain NO WBC SEEN NO ORGANISMS SEEN   Final   Culture   Final    NO GROWTH 3 DAYS Performed at [facility], [address], [place], [place] [address]    Report Status 04/01/2023 FINAL  Final  MRSA Next Gen by PCR, Nasal     Status: None   Collection Time: 03/29/23  3:00 PM   Specimen: Nasal Mucosa; Nasal Swab  Result Value Ref Range Status   MRSA by PCR Next Gen NOT DETECTED NOT DETECTED Final    Comment: (NOTE) The GeneXpert MRSA Assay (FDA approved for NASAL specimens only), is one component of a comprehensive MRSA colonization surveillance program. It is not intended to diagnose MRSA infection nor to guide or monitor treatment for MRSA infections. Test performance is not FDA approved in patients less than 77 years old. Performed at [facility], [address] [place], [place] [address]   Group A Strep by PCR     Status: None   Collection Time: 03/30/23  6:00 PM   Specimen: Throat; Sterile Swab  Result Value Ref Range Status   Group A Strep by PCR NOT DETECTED NOT DETECTED Final    Comment: Performed at [facility], [address] [place], [place] [address]    Coagulation Studies: No results for input(s): "LABPROT", "INR" in the last 72 hours.   Urinalysis: No results for input(s): "COLORURINE", "LABSPEC", "PHURINE", "GLUCOSEU", "HGBUR", "BILIRUBINUR", "KETONESUR", "PROTEINUR", "UROBILINOGEN", "NITRITE", "LEUKOCYTESUR" in the last 72 hours.  Invalid input(s): "APPERANCEUR"    Imaging: No results found.   Medications:    sodium chloride Stopped (04/14/23 1008)   anticoagulant sodium citrate      ceFAZolin (ANCEF) IV       amiodarone  200 mg Oral Daily   apixaban  2.5 mg Oral BID   arformoterol  15 mcg Nebulization BID   aspirin EC  81 mg Oral Daily   carvedilol  3.125 mg Oral BID WC   Chlorhexidine Gluconate Cloth  6 each Topical Daily   Chlorhexidine Gluconate Cloth  6 each Topical Q0600   cyanocobalamin  1,000 mcg Oral  Daily  feeding supplement (NEPRO CARB STEADY)  237 mL Oral BID BM   gabapentin  100 mg Oral QHS   hydrALAZINE  10 mg Oral Q8H   insulin aspart  0-15 Units Subcutaneous TID WC   insulin aspart  0-5 Units Subcutaneous QHS   insulin aspart  2 Units Subcutaneous TID WC   insulin glargine-yfgn  8 Units Subcutaneous QHS   isosorbide mononitrate  30 mg Oral Daily   multivitamin  1 tablet Oral BID   nystatin   Topical TID   polyethylene glycol  17 g Oral Daily   promethazine  6.25 mg Oral QHS   revefenacin  175 mcg Nebulization Daily   rosuvastatin  10 mg Oral Once per day on Mon Wed Fri Sat   sodium bicarbonate  650 mg Oral BID   torsemide  40 mg Oral Q M,W,F   acetaminophen, ALPRAZolam, alteplase, alum & mag hydroxide-simeth, anticoagulant sodium citrate, dextromethorphan-guaiFENesin, diphenhydrAMINE, famotidine, fentaNYL (SUBLIMAZE) injection, heparin, hydrALAZINE, HYDROmorphone (DILAUDID) injection, levalbuterol, lidocaine (PF), lidocaine-prilocaine, melatonin, methylPREDNISolone (SOLU-MEDROL) injection, midazolam, ondansetron (ZOFRAN) IV, ondansetron (ZOFRAN) IV, mouth rinse, oxyCODONE, pentafluoroprop-tetrafluoroeth, phenol, senna-docusate, torsemide, traZODone  Assessment/ Plan:  Ms. DANAE OLAND is a 88 y.o.  female with diabetes mellitus type II, hypertension, coronary artery disease, peripheral arterial disease, tremor, CVA, congestive heart failure , anxiety, peripheral arterial diease and anemia who is admitted to [facility] on 03/25/2023 for COPD exacerbation (HCC) [J44.1] Chest pain [R07.9] Community acquired pneumonia, unspecified laterality [J18.9]  Acute kidney injury with acute metabolic acidosis on chronic kidney disease stage IIIB: baseline creatinine of 1.48, GFR of 34 on 12/09/2022. Acute kidney injury secondary to acute cardiorenal syndrome. Chronic kidney disease secondary to hypertension.  Creatinine stable  Continue gentle diuretics Continue sodium bicarbonate Low threshold  for renal replacement therapy.   Hypertension with chronic kidney disease: with chronic systolic and diastolic congestive heart failure.  Appreciate cardiology input.  Currently on carvedilol, hydralazine, isosorbide mononitrate Torsemide 40mg  three times weekly and PRN  Hyponatremia: secondary to cardiac failure and kidney disease.  Sodium 133 - stable.    LOS: 23 Laela Deviney 6/22/20241:31 PM

## 2023-04-17 NOTE — Progress Notes (Signed)
CROSS COVER NOTE  NAME: Jill Hudson MRN: [id number] DOB : 31-May-1935    Concern as stated by nurse / staff   Hey! Pt called me in there saying she couldn't breathe. She definitely had a change in respiratory status. I had to place her on a NRB, and RT is giving her a breathing treatment. I was just wanting to know if you wanted to do a CXR just to check things out. I'm going to re-assess her once the treatment is done. Thanks!   Dorthy Cooler, RN Hey, still waiting on XR, but pt does not look good despite the breathing treatment. She is now on HFNC 12L and only at 91-92%   Pertinent findings on chart review: Last note reviewed: Progress note, cardiology, nephrology note and palliative care notes reviewed: Overall, patient with multiple acute on chronic comorbidities with poor overall outlook short-term and long-term:  Current medical conditions Acute on chronic systolic CHF secondary to ischemic cardiomyopathy (EF 35-40) 2. Acute respiratory failure with hypoxia previously requiring BiPAP 3. Cardiogenic shock s/p milrinone drip 6/7 4. NSTEMI--medical mgmt, cardiology signed off 5. Paroxysmal A-fib on Eliquis and amiodarone 6. Worsening AKI with permacath for possible dialysis 7. Acute urinary retention currently with Foley 8. COPD exacerbation 9. Pleural effusion s/p thoracentesis on 6/3  Per palliative care on 6/22:   DNR/DNI Leaning towards not pursuing dialysis if needed/recommended   Physical exam    04/17/2023   11:30 PM 04/17/2023   11:29 PM 04/17/2023   11:22 PM  Vitals with BMI  Systolic   96  Diastolic   55  Pulse 67 61 68  Physical Exam  Stat ABG    Component Value Date/Time   PHART 7.39 04/17/2023 2321   PCO2ART 39 04/17/2023 2321   PO2ART 59 (L) 04/17/2023 2321   HCO3 23.6 04/17/2023 2321   ACIDBASEDEF 1.2 04/17/2023 2321   O2SAT 91.8 04/17/2023 2321   Stat chest x-ray IMPRESSION: 1. Worsening volume status, with progressive vascular  congestion, increased airspace disease, and enlarging bilateral pleural effusions.  Stat EKG:    Assessment and  Interventions   Assessment: Acute respiratory failure with hypoxia  secondary to volume overload (acute on chronic systolic CHF) probably from discontinuation of furosemide on 6/12 as a result of worsening kidney function (med history reviewed)   Plan: Lasix push 60 mg IV x 1 Nitropaste 0.5 Continue  high flow and wean as tolerated.  Can use BiPAP if needed Continue monitoring of input and output Continue restarting low-dose Lasix Continue to monitor renal function Additional labs ordered including troponin and BNP (patient on apixaban so low suspicion for PE)    Labs Troponin 26-26 with BNP over 4500, D-dimer 1.59, glucose 236, sodium 127, creatinine 2.89, down from the day prior  Plan: --Labs suggesting suspected etiology of fluid overload --Continue plan above -- Elevated D-dimer likely related to kidney function as patient is on apixaban.    CRITICAL CARE Performed by: Andris Baumann   Total critical care time: 75 minutes  Critical care time was exclusive of separately billable procedures and treating other patients.  Critical care was necessary to treat or prevent imminent or life-threatening deterioration.  Critical care was time spent personally by me on the following activities: development of treatment plan with patient and/or surrogate as well as nursing, discussions with consultants, evaluation of patient's response to treatment, examination of patient, obtaining history from patient or surrogate, ordering and performing treatments and interventions,  ordering and review of laboratory studies, ordering and review of radiographic studies, pulse oximetry and re-evaluation of patient's condition.

## 2023-04-18 DIAGNOSIS — J189 Pneumonia, unspecified organism: Secondary | ICD-10-CM | POA: Diagnosis not present

## 2023-04-18 DIAGNOSIS — Z515 Encounter for palliative care: Secondary | ICD-10-CM

## 2023-04-18 DIAGNOSIS — N179 Acute kidney failure, unspecified: Secondary | ICD-10-CM | POA: Diagnosis not present

## 2023-04-18 DIAGNOSIS — J441 Chronic obstructive pulmonary disease with (acute) exacerbation: Secondary | ICD-10-CM | POA: Diagnosis not present

## 2023-04-18 DIAGNOSIS — R570 Cardiogenic shock: Secondary | ICD-10-CM | POA: Diagnosis not present

## 2023-04-18 LAB — CBC
HCT: 31.5 % — ABNORMAL LOW (ref 36.0–46.0)
Hemoglobin: 9.8 g/dL — ABNORMAL LOW (ref 12.0–15.0)
MCH: 27.8 pg (ref 26.0–34.0)
MCHC: 31.1 g/dL (ref 30.0–36.0)
MCV: 89.5 fL (ref 80.0–100.0)
Platelets: 322 10*3/uL (ref 150–400)
RBC: 3.52 MIL/uL — ABNORMAL LOW (ref 3.87–5.11)
RDW: 14.1 % (ref 11.5–15.5)
WBC: 14 10*3/uL — ABNORMAL HIGH (ref 4.0–10.5)
nRBC: 0 % (ref 0.0–0.2)

## 2023-04-18 LAB — BASIC METABOLIC PANEL
Anion gap: 10 (ref 5–15)
BUN: 64 mg/dL — ABNORMAL HIGH (ref 8–23)
CO2: 21 mmol/L — ABNORMAL LOW (ref 22–32)
Calcium: 8 mg/dL — ABNORMAL LOW (ref 8.9–10.3)
Chloride: 96 mmol/L — ABNORMAL LOW (ref 98–111)
Creatinine, Ser: 2.89 mg/dL — ABNORMAL HIGH (ref 0.44–1.00)
GFR, Estimated: 15 mL/min — ABNORMAL LOW (ref >60)
Glucose, Bld: 236 mg/dL — ABNORMAL HIGH (ref 70–99)
Potassium: 4.9 mmol/L (ref 3.5–5.1)
Sodium: 127 mmol/L — ABNORMAL LOW (ref 135–145)

## 2023-04-18 LAB — TROPONIN I (HIGH SENSITIVITY)
Troponin I (High Sensitivity): 26 ng/L — ABNORMAL HIGH (ref <18)
Troponin I (High Sensitivity): 26 ng/L — ABNORMAL HIGH (ref <18)

## 2023-04-18 LAB — GLUCOSE, CAPILLARY
Glucose-Capillary: 170 mg/dL — ABNORMAL HIGH (ref 70–99)
Glucose-Capillary: 160 mg/dL — ABNORMAL HIGH (ref 70–99)
Glucose-Capillary: 120 mg/dL — ABNORMAL HIGH (ref 70–99)
Glucose-Capillary: 165 mg/dL — ABNORMAL HIGH (ref 70–99)

## 2023-04-18 LAB — D-DIMER, QUANTITATIVE: D-Dimer, Quant: 1.59 ug/mL-FEU — ABNORMAL HIGH (ref 0.00–0.50)

## 2023-04-18 LAB — BRAIN NATRIURETIC PEPTIDE: B Natriuretic Peptide: >4500 pg/mL — ABNORMAL HIGH (ref 0.0–100.0)

## 2023-04-18 LAB — MAGNESIUM: Magnesium: 2.3 mg/dL (ref 1.7–2.4)

## 2023-04-18 LAB — PROCALCITONIN: Procalcitonin: 0.1 ng/mL

## 2023-04-18 MED ORDER — NITROGLYCERIN 2 % TD OINT
0.5000 [in_us] | TOPICAL_OINTMENT | Freq: Four times a day (QID) | TRANSDERMAL | Status: AC
  Administered 2023-04-18: 0.5 [in_us] via TOPICAL
  Filled 2023-04-18: qty 1

## 2023-04-18 MED ORDER — FUROSEMIDE 10 MG/ML IJ SOLN
60.0000 mg | Freq: Once | INTRAMUSCULAR | Status: AC
  Administered 2023-04-18: 60 mg via INTRAVENOUS
  Filled 2023-04-18: qty 6

## 2023-04-18 NOTE — Progress Notes (Signed)
Pt taken off bipap and placed on 10L HFNC, tolerating well at this time. Respiratory rate 20/min, sats 95%. Will continue to monitor.

## 2023-04-18 NOTE — Progress Notes (Signed)
Progress Note   Patient: Jill Hudson ZOX:[id number] DOB: December 13, 1934 DOA: 03/25/2023     24 DOS: the patient was seen and examined on 04/18/2023 at 9:23AM      Brief hospital course: Mrs. Chamberlin is an 88 y.o. F with COPD, dCHF, CKD IIIb basleine 1.4-1.7, hx TIA, GIB and IDA, CAD, anxiety, and PVD s/p PCI who presented with CHF and renal failure.   Deteriorated with medical management requiring milrinone.  Developed renal failure with Creatinine peaking at 3.8.        Assessment and Plan: * Cardiogenic shock (HCC) Acute on chronic systolic congestive heart failure Acute respiratory failure with hypoxia due to CHF Ischemic cardiomyopathy EF 30 to 35% this admission.  Cardiology was consulted, she was on milrinone for a time during this admission.  Suspected to have ischemic cardiomyopathy.  Catheterization not possible due to renal function.  Overnight hypoxic again, chest x-ray showed reaccumulating pulmonary edema -Resume Lasix IV -Continue torsemide for now, defer transition to daily dosing versus continued IV Lasix to nephrology  - Monitor potassium and renal function - Consult nephrology, appreciate recommendations - Continue carvedilol, hydralazine, Imdur    Acute kidney injury superimposed on CKD IIIb CKD IIIa ruled out.   Baseline creatinine looks to be about 1.4-1.7 over the last few years.  Here her creatinine is worsened with cardiogenic shock and diuresis up to 3.8 peak.  More recently it is trended down to the high 2s.  Discussed with Nephrology, they feel it may be necessary to push diuresis to allow better management of CHF symptoms, at expense of renal function. - Diuretics per Nephrology -Bicarbonate tablets per nephrology  Paroxysmal atrial fibrillation -Continue amiodarone, Eliquis  NSTEMI (non-ST elevated myocardial infarction) (HCC) Given the drop in ejection fraction, this was presumed to be ischemic cardiomyopathy.  Cardiology were consulted and  catheterization was deferred due to renal dysfunction. - Continue aspirin, beta-blocker, statin  Type II diabetes mellitus with renal manifestations (HCC) Hemoglobin A1c 7.9%.  Sugars are well-controlled - Continue Semglee -Continue sliding scale corrections  COPD exacerbation (HCC) No significant wheezing on exam today -Continue Yupelri, Xopenex, Brovana  Essential hypertension History of CVA (cerebrovascular accident) HLD (hyperlipidemia) Blood pressure controlled - Continue Imdur, carvedilol, hydralazine  Hyponatremia Sodium slightly down at 127, hypervolemic - Diuretics as above  Iron deficiency anemia Hgb stable, no clinical bleeding  Acute urinary retention Resolved  Hyperkalemia Resolved  Pleural effusion Right thoracentesis removed 700 mL of fluid.  This was done by interventional radiology on 6/3.    Hypokalemia Resolved           Subjective: Overnight with significant worsening respiratory status, requiring BiPAP.  Procalcitonin negative, BNP still greater than 4500.  With diuresis overnight, she is able to come off of the BiPAP, just use supplemental nasal cannula, but still feeling quite weak and tired and short of breath.     Physical Exam: BP (!) 136/100   Pulse (!) 59   Temp 98.1 F (36.7 C) (Oral)   Resp 19   Ht 5' (1.524 m)   Wt 62.5 kg   SpO2 100%   BMI 26.91 kg/m   Elderly adult female, lying in bed, appears weak and tired Rate normal, regular, no murmurs, no significant pitting edema Lung sounds diminished, but I do not appreciate wheezing, appears out of breath at rest Abdomen soft without tenderness palpation or guarding, no distention Attention normal, affect tired, judgment insight appear normal, severe generalized weakness but symmetric strength, face symmetric, speech  fluent, oriented to person, place, time  Data Reviewed: Patient metabolic panel shows creatinine down to 2.89, sodium down to 127 ABG shows normal CO2, pO2  down to 59 Procalcitonin 0.1 White blood cell count 14 Hemoglobin stable at 9.8    Family Communication: Daughter at the bedside    Disposition: Status is: Inpatient The patient was admitted for congestive heart failure.  She did require milrinone for time but was stabilized  We are working with palliative care, trying to determine discharge plans,, with the patient and worsening respiratory status overnight  Discussed with nephrology today, we suspect she will need aggressive diuresis at the expense of kidney function.  Nephrology will titrate diuretics over the next few days in the hospital, possibly to rehab by Tuesday or Wednesday        Author: Alberteen Sam, MD 04/18/2023 1:40 PM  For on call review [email or web address].

## 2023-04-18 NOTE — Progress Notes (Signed)
PT Cancellation Note  Patient Details Name: Jill Hudson MRN: [id number] DOB: April 22, 1935   Cancelled Treatment:    Reason Eval/Treat Not Completed: Medical issues which prohibited therapy  Chart reviewed and OK from RN if pt feels like participating in session.  Pt declined.  Will return at a later time/date.   Danielle Dess 04/18/2023, 1:25 PM

## 2023-04-18 NOTE — Progress Notes (Signed)
Patient states his abdominal adjustments at all level. [facility]  ROUNDING NOTE   Subjective:   Ms. RONEISHA STERN was admitted to [facility] on 03/25/2023 for COPD exacerbation [facility]) [J44.1] Chest pain [R07.9] Community acquired pneumonia, unspecified laterality [J18.9]  Furosemide 60mg  IV given last night.   Daughter at bedside.   Patient states she is not interested in dialysis.  Objective:  Vital signs in last 24 hours:  Temp:  [97.5 F (36.4 C)-98.8 F (37.1 C)] 98.1 F (36.7 C) (06/22 2311) Pulse Rate:  [59-73] 64 (06/23 1000) Resp:  [14-25] 18 (06/23 1000) BP: (92-152)/(42-106) 128/43 (06/23 1000) SpO2:  [88 %-100 %] 100 % (06/23 1000) FiO2 (%):  [40 %] 40 % (06/23 0805)  Weight change:  Filed Weights   04/13/23 0812 04/16/23 0840 04/17/23 0500  Weight: 58.6 kg 60.6 kg 62.5 kg    Intake/Output: I/O last 3 completed shifts: In: 400 [P.O.:400] Out: 1650 [Urine:1650]   Intake/Output this shift:  Total I/O In: 240 [P.O.:240] Out: -   Physical Exam: General: NAD, laying in bed  Head: Normocephalic, atraumatic. Moist oral mucosal membranes  Eyes: Anicteric  Lungs:  Diminished in bases, HFNC 10 L   Heart: regular  Abdomen:  Soft, nontender  Extremities:  No peripheral edema.  Neurologic: Alert and oriented to self, moving all four extremities  Skin: warm   Access:  none    Basic Metabolic Panel: Recent Labs  Lab 04/12/23 0348 04/13/23 0437 04/14/23 0801 04/15/23 0832 04/16/23 0933 04/17/23 1055 04/18/23 0021  NA 131* 130* 136 134* 133*  --  127*  K 4.9 4.5 4.8 4.8 4.6  --  4.9  CL 101 101 104 103 101  --  96*  CO2 19* 18* 22 22 23   --  21*  GLUCOSE 107* 108* 93 143* 122*  --  236*  BUN 78* 68* 65* 63* 65*  --  64*  CREATININE 3.64* 3.33* 3.13* 3.00* 3.07*  --  2.89*  CALCIUM 8.0* 8.2* 8.2* 8.2* 8.3*  --  8.0*  MG 2.3  --   --   --   --  2.4 2.3  PHOS 4.9*  --   --  4.4  --   --   --      Liver Function Tests: Recent Labs  Lab  04/15/23 0832  ALBUMIN 2.7*    No results for input(s): "LIPASE", "AMYLASE" in the last 168 hours. No results for input(s): "AMMONIA" in the last 168 hours.  CBC: Recent Labs  Lab 04/12/23 0348 04/13/23 0437 04/14/23 0801 04/18/23 0021  WBC 7.8 8.3 8.9 14.0*  HGB 8.1* 8.7* 8.8* 9.8*  HCT 26.1* 27.7* 28.4* 31.5*  MCV 88.8 88.5 89.0 89.5  PLT 207 235 229 322     Cardiac Enzymes: No results for input(s): "CKTOTAL", "CKMB", "CKMBINDEX", "TROPONINI" in the last 168 hours.  BNP: Invalid input(s): "POCBNP"  CBG: Recent Labs  Lab 04/17/23 0827 04/17/23 1315 04/17/23 1642 04/17/23 2054 04/18/23 0824  GLUCAP 104* 195* 137* 137* 170*     Microbiology: Results for orders placed or performed during the hospital encounter of 03/25/23  Blood Culture (routine x 2)     Status: None   Collection Time: 03/25/23 12:09 PM   Specimen: BLOOD  Result Value Ref Range Status   Specimen Description BLOOD LEFT [facility]  Final   Special Requests   Final    BOTTLES DRAWN AEROBIC AND ANAEROBIC Blood Culture adequate volume   Culture   Final  NO GROWTH 5 DAYS Performed at [facility], [address] [place], [place] [address]    Report Status 03/30/2023 FINAL  Final  Blood Culture (routine x 2)     Status: None   Collection Time: 03/25/23 12:10 PM   Specimen: BLOOD  Result Value Ref Range Status   Specimen Description BLOOD LEFT FA  Final   Special Requests   Final    BOTTLES DRAWN AEROBIC AND ANAEROBIC Blood Culture adequate volume   Culture   Final    NO GROWTH 5 DAYS Performed at [facility], [address], [place], [place] [address]    Report Status 03/30/2023 FINAL  Final  SARS Coronavirus 2 by RT PCR (hospital order, performed in [facility] hospital lab) *cepheid single result test* Anterior Nasal Swab     Status: None   Collection Time: 03/25/23  1:18 PM   Specimen: Anterior Nasal Swab  Result Value Ref Range Status   SARS Coronavirus 2 by RT PCR  NEGATIVE NEGATIVE Final    Comment: (NOTE) SARS-CoV-2 target nucleic acids are NOT DETECTED.  The SARS-CoV-2 RNA is generally detectable in upper and lower respiratory specimens during the acute phase of infection. The lowest concentration of SARS-CoV-2 viral copies this assay can detect is 250 copies / mL. A negative result does not preclude SARS-CoV-2 infection and should not be used as the sole basis for treatment or other patient management decisions.  A negative result may occur with improper specimen collection / handling, submission of specimen other than nasopharyngeal swab, presence of viral mutation(s) within the areas targeted by this assay, and inadequate number of viral copies (<250 copies / mL). A negative result must be combined with clinical observations, patient history, and epidemiological information.  Fact Sheet for Patients:   RoadLapTop.co.za  Fact Sheet for Healthcare Providers: http://kim-miller.com/  This test is not yet approved or  cleared by the [place] FDA and has been authorized for detection and/or diagnosis of SARS-CoV-2 by FDA under an Emergency Use Authorization (EUA).  This EUA will remain in effect (meaning this test can be used) for the duration of the COVID-19 declaration under Section 564(b)(1) of the Act, 21 U.S.C. section 360bbb-3(b)(1), unless the authorization is terminated or revoked sooner.  Performed at [facility], [address] [place], [place] [address]   MRSA Next Gen by PCR, Nasal     Status: None   Collection Time: 03/27/23  4:40 AM   Specimen: Nasal Mucosa; Nasal Swab  Result Value Ref Range Status   MRSA by PCR Next Gen NOT DETECTED NOT DETECTED Final    Comment: (NOTE) The GeneXpert MRSA Assay (FDA approved for NASAL specimens only), is one component of a comprehensive MRSA colonization surveillance program. It is not intended to diagnose MRSA infection nor to  guide or monitor treatment for MRSA infections. Test performance is not FDA approved in patients less than 66 years old. Performed at [facility], [address] [place], [place] [address]   Respiratory (~20 pathogens) panel by PCR     Status: None   Collection Time: 03/28/23  4:00 PM   Specimen: Nasopharyngeal Swab; Respiratory  Result Value Ref Range Status   Adenovirus NOT DETECTED NOT DETECTED Final   Coronavirus 229E NOT DETECTED NOT DETECTED Final    Comment: (NOTE) The Coronavirus on the Respiratory Panel, DOES NOT test for the novel  Coronavirus (2019 nCoV)    Coronavirus HKU1 NOT DETECTED NOT DETECTED Final   Coronavirus  NL63 NOT DETECTED NOT DETECTED Final   Coronavirus OC43 NOT DETECTED NOT DETECTED Final   Metapneumovirus NOT DETECTED NOT DETECTED Final   Rhinovirus / Enterovirus NOT DETECTED NOT DETECTED Final   Influenza A NOT DETECTED NOT DETECTED Final   Influenza B NOT DETECTED NOT DETECTED Final   Parainfluenza Virus 1 NOT DETECTED NOT DETECTED Final   Parainfluenza Virus 2 NOT DETECTED NOT DETECTED Final   Parainfluenza Virus 3 NOT DETECTED NOT DETECTED Final   Parainfluenza Virus 4 NOT DETECTED NOT DETECTED Final   Respiratory Syncytial Virus NOT DETECTED NOT DETECTED Final   Bordetella pertussis NOT DETECTED NOT DETECTED Final   Bordetella Parapertussis NOT DETECTED NOT DETECTED Final   Chlamydophila pneumoniae NOT DETECTED NOT DETECTED Final   Mycoplasma pneumoniae NOT DETECTED NOT DETECTED Final    Comment: Performed at [facility], [address], [place], [place] [address]  Body fluid culture w Gram Stain     Status: None   Collection Time: 03/29/23 10:27 AM   Specimen: PATH Cytology Pleural fluid  Result Value Ref Range Status   Specimen Description   Final    PLEURAL Performed at [facility], [address] [place], [place] [address]    Special Requests   Final    NONE Performed at [facility], [address] [place], [place] [address]    Gram Stain NO WBC SEEN NO ORGANISMS SEEN   Final   Culture   Final    NO GROWTH 3 DAYS Performed at [facility], [address], [place], [place] [address]    Report Status 04/01/2023 FINAL  Final  MRSA Next Gen by PCR, Nasal     Status: None   Collection Time: 03/29/23  3:00 PM   Specimen: Nasal Mucosa; Nasal Swab  Result Value Ref Range Status   MRSA by PCR Next Gen NOT DETECTED NOT DETECTED Final    Comment: (NOTE) The GeneXpert MRSA Assay (FDA approved for NASAL specimens only), is one component of a comprehensive MRSA colonization surveillance program. It is not intended to diagnose MRSA infection nor to guide or monitor treatment for MRSA infections. Test performance is not FDA approved in patients less than 43 years old. Performed at [facility], [address] [place], [place] [address]   Group A Strep by PCR     Status: None   Collection Time: 03/30/23  6:00 PM   Specimen: Throat; Sterile Swab  Result Value Ref Range Status   Group A Strep by PCR NOT DETECTED NOT DETECTED Final    Comment: Performed at [facility], [address] [place], [place] [address]    Coagulation Studies: No results for input(s): "LABPROT", "INR" in the last 72 hours.   Urinalysis: No results for input(s): "COLORURINE", "LABSPEC", "PHURINE", "GLUCOSEU", "HGBUR", "BILIRUBINUR", "KETONESUR", "PROTEINUR", "UROBILINOGEN", "NITRITE", "LEUKOCYTESUR" in the last 72 hours.  Invalid input(s): "APPERANCEUR"    Imaging: DG Chest Port 1 View  Result Date: 04/17/2023 CLINICAL DATA:  Shortness of breath EXAM: PORTABLE CHEST 1 VIEW COMPARISON:  04/02/2023 FINDINGS: Single frontal view of the chest demonstrates stable enlargement of the cardiac silhouette. There is increased central vascular congestion, with progressive bilateral veiling opacities consistent with effusions and underlying consolidation. No pneumothorax. No acute bony  abnormalities. IMPRESSION: 1. Worsening volume status, with progressive vascular congestion, increased airspace disease, and enlarging bilateral pleural effusions. Electronically Signed   By: Sharlet Salina M.D.   On: 04/17/2023 23:31  Medications:    sodium chloride Stopped (04/14/23 1008)   anticoagulant sodium citrate      ceFAZolin (ANCEF) IV       amiodarone  200 mg Oral Daily   apixaban  2.5 mg Oral BID   arformoterol  15 mcg Nebulization BID   aspirin EC  81 mg Oral Daily   carvedilol  3.125 mg Oral BID WC   Chlorhexidine Gluconate Cloth  6 each Topical Daily   Chlorhexidine Gluconate Cloth  6 each Topical Q0600   cyanocobalamin  1,000 mcg Oral Daily   feeding supplement (NEPRO CARB STEADY)  237 mL Oral BID BM   gabapentin  100 mg Oral QHS   hydrALAZINE  10 mg Oral Q8H   insulin aspart  0-15 Units Subcutaneous TID WC   insulin aspart  0-5 Units Subcutaneous QHS   insulin aspart  2 Units Subcutaneous TID WC   insulin glargine-yfgn  8 Units Subcutaneous QHS   isosorbide mononitrate  30 mg Oral Daily   multivitamin  1 tablet Oral BID   nystatin   Topical TID   polyethylene glycol  17 g Oral Daily   promethazine  6.25 mg Oral QHS   revefenacin  175 mcg Nebulization Daily   rosuvastatin  10 mg Oral Once per day on Mon Wed Fri Sat   sodium bicarbonate  650 mg Oral BID   torsemide  40 mg Oral Q M,W,F   acetaminophen, ALPRAZolam, alteplase, alum & mag hydroxide-simeth, anticoagulant sodium citrate, dextromethorphan-guaiFENesin, diphenhydrAMINE, famotidine, fentaNYL (SUBLIMAZE) injection, heparin, hydrALAZINE, HYDROmorphone (DILAUDID) injection, levalbuterol, lidocaine (PF), lidocaine-prilocaine, melatonin, methylPREDNISolone (SOLU-MEDROL) injection, midazolam, ondansetron (ZOFRAN) IV, ondansetron (ZOFRAN) IV, mouth rinse, oxyCODONE, pentafluoroprop-tetrafluoroeth, phenol, senna-docusate, sodium chloride, torsemide, traZODone  Assessment/ Plan:  Ms. WENDIE DISKIN is a 88 y.o.   female with diabetes mellitus type II, hypertension, coronary artery disease, peripheral arterial disease, tremor, CVA, congestive heart failure , anxiety, peripheral arterial diease and anemia who is admitted to [facility] on 03/25/2023 for COPD exacerbation (HCC) [J44.1] Chest pain [R07.9] Community acquired pneumonia, unspecified laterality [J18.9]  Acute kidney injury with acute metabolic acidosis on chronic kidney disease stage IIIB: baseline creatinine of 1.48, GFR of 34 on 12/09/2022. Acute kidney injury secondary to acute cardiorenal syndrome. Chronic kidney disease secondary to hypertension.  Creatinine stable  Continue diuretics Continue sodium bicarbonate Low threshold for renal replacement therapy.   Hypertension with chronic kidney disease: with chronic systolic and diastolic congestive heart failure.  Appreciate cardiology input.  Currently on carvedilol, hydralazine, isosorbide mononitrate - IV furosemide again today and then may adjust PO diuretics.   Hyponatremia: secondary to cardiac failure and kidney disease. Progressively lower due to volume status.     LOS: 24 Christpher Stogsdill 6/23/202411:05 AM

## 2023-04-18 NOTE — Progress Notes (Signed)
Daily Progress Note   Patient Name: Jill Hudson       Date: 04/18/2023 DOB: February 15, 1935  Age: 88 y.o. MRN#: [id number] Attending Physician: Jill Hudson, * Primary Care Physician: Jill Milan, MD Admit Date: 03/25/2023  Reason for Consultation/Follow-up: Establishing goals of care  HPI/Brief Hospital Review: 88 year old female with history of COPD, HTN, HLD, DM2, stroke, TIA, GERD, anxiety, PVD, CAD, MI, former tobacco use, CKD stage III, GI bleed, iron deficiency anemia, PAD status post PCI admitted on 03/25/2023 with shortness of breath and hypoxia. Patient was found to have bilateral pulmonary edema /effusion with elevated BNP.    Hospital course has been complicated by worsening renal function-concern for need for HD.   Palliative Medicine consulted for assisting with goals of care conversations.   Subjective: Extensive chart review has been completed prior to meeting patient including labs, vital signs, imaging, progress notes, orders, and available advanced directive documents from current and previous encounters.    Review of chart, episode of respiratory distress over night, placed on bipap and dose of IV lasix administered. Able to transition to HFNC this AM.  Visited Jill Hudson at her bedside. Awake, alert and reports feeling better, hoping to avoid events that occurred last night. Nephrology team rounding during time of visit, planning to repeat dose of IV lasix.  Jill Hudson reports feeling better with improved breathing. Daughter-Jill Hudson at bedside, shares she feels Jill Hudson having multiple bowel movements overnight and the stress it causes to be rolled and cleaned causes respiratory issues. Jill Hudson has had little contact with her brother this morning. Jill Hudson is hopeful  her brother will be able to visit early next week for a family goals of care discussion.  PMT to continue to follow for ongoing needs and support.   Assessment/Recommendation/Plan  DNR/DNI No indication for dialysis at this time, repeating dose of IV lasix for pulmonary congestion PMT to continue to follow for ongoing needs and support  Thank you for allowing the Palliative Medicine Team to assist in the care of this patient.  Total time:  25 minutes  Time spent includes: Detailed review of medical records (labs, imaging, vital signs), medically appropriate exam (mental status, respiratory, cardiac, skin), discussed with treatment team, counseling and educating patient, family and staff, documenting clinical information, medication management and coordination of care.  Jill Deed, DNP, AGNP-C Palliative Medicine   Please contact Palliative Medicine Team phone at [phone] for questions and concerns.

## 2023-04-19 DIAGNOSIS — R570 Cardiogenic shock: Secondary | ICD-10-CM | POA: Diagnosis not present

## 2023-04-19 DIAGNOSIS — Z7189 Other specified counseling: Secondary | ICD-10-CM | POA: Diagnosis not present

## 2023-04-19 LAB — CBC
HCT: 26.9 % — ABNORMAL LOW (ref 36.0–46.0)
Hemoglobin: 8.2 g/dL — ABNORMAL LOW (ref 12.0–15.0)
MCH: 27.3 pg (ref 26.0–34.0)
MCHC: 30.5 g/dL (ref 30.0–36.0)
MCV: 89.7 fL (ref 80.0–100.0)
Platelets: 231 10*3/uL (ref 150–400)
RBC: 3 MIL/uL — ABNORMAL LOW (ref 3.87–5.11)
RDW: 14.2 % (ref 11.5–15.5)
WBC: 8.5 10*3/uL (ref 4.0–10.5)
nRBC: 0 % (ref 0.0–0.2)

## 2023-04-19 LAB — COMPREHENSIVE METABOLIC PANEL
ALT: 32 U/L (ref 0–44)
AST: 24 U/L (ref 15–41)
Albumin: 2.6 g/dL — ABNORMAL LOW (ref 3.5–5.0)
Alkaline Phosphatase: 80 U/L (ref 38–126)
Anion gap: 8 (ref 5–15)
BUN: 66 mg/dL — ABNORMAL HIGH (ref 8–23)
CO2: 27 mmol/L (ref 22–32)
Calcium: 8.4 mg/dL — ABNORMAL LOW (ref 8.9–10.3)
Chloride: 101 mmol/L (ref 98–111)
Creatinine, Ser: 2.94 mg/dL — ABNORMAL HIGH (ref 0.44–1.00)
GFR, Estimated: 15 mL/min — ABNORMAL LOW (ref >60)
Glucose, Bld: 107 mg/dL — ABNORMAL HIGH (ref 70–99)
Potassium: 4.1 mmol/L (ref 3.5–5.1)
Sodium: 136 mmol/L (ref 135–145)
Total Bilirubin: 0.6 mg/dL (ref 0.3–1.2)
Total Protein: 5 g/dL — ABNORMAL LOW (ref 6.5–8.1)

## 2023-04-19 LAB — GLUCOSE, CAPILLARY
Glucose-Capillary: 149 mg/dL — ABNORMAL HIGH (ref 70–99)
Glucose-Capillary: 173 mg/dL — ABNORMAL HIGH (ref 70–99)
Glucose-Capillary: 174 mg/dL — ABNORMAL HIGH (ref 70–99)
Glucose-Capillary: 180 mg/dL — ABNORMAL HIGH (ref 70–99)
Glucose-Capillary: 88 mg/dL (ref 70–99)

## 2023-04-19 LAB — PROCALCITONIN: Procalcitonin: 0.6 ng/mL

## 2023-04-19 MED ORDER — FUROSEMIDE 10 MG/ML IJ SOLN
80.0000 mg | Freq: Two times a day (BID) | INTRAMUSCULAR | Status: AC
  Administered 2023-04-19 (×2): 80 mg via INTRAVENOUS
  Filled 2023-04-19 (×2): qty 8

## 2023-04-19 MED ORDER — OXYCODONE HCL 5 MG PO TABS: 2.5000 mg | ORAL_TABLET | ORAL | Status: DC | PRN

## 2023-04-19 NOTE — Care Management Important Message (Signed)
Important Message  Patient Details  Name: Jill Hudson MRN: [id number] Date of Birth: [birth year]   Medicare Important Message Given:  Yes     [name] 04/19/2023, 11:11 AM

## 2023-04-19 NOTE — Progress Notes (Signed)
Patient ID: Jill Hudson, female   DOB: December 26, 1934, 88 y.o.   MRN: [id number]     Advanced Heart Failure Rounding Note  PCP-Cardiologist: None   Subjective:    Patient is on 5L [place] this morning.  Has not been out of bed since last week.  Feels weak but denies dyspnea.  No chest pain.  Creatinine stable, 2.89 => 2.94. BP seems stable though on the lower side.    Objective:   Weight Range: 63 kg Body mass index is 27.13 kg/m.   Vital Signs:   Temp:  [98 F (36.7 C)-98.6 F (37 C)] 98.4 F (36.9 C) (06/24 0400) Pulse Rate:  [47-69] 59 (06/24 0800) Resp:  [13-22] 17 (06/24 0800) BP: (95-152)/(37-100) 95/73 (06/24 0800) SpO2:  [96 %-100 %] 96 % (06/24 0800) Weight:  [63 kg] 63 kg (06/24 0500) Last BM Date : 04/17/23  Weight change: Filed Weights   04/16/23 0840 04/17/23 0500 04/19/23 0500  Weight: 60.6 kg 62.5 kg 63 kg    Intake/Output:   Intake/Output Summary (Last 24 hours) at 04/19/2023 0833 Last data filed at 04/19/2023 0400 Gross per 24 hour  Intake 720 ml  Output 650 ml  Net 70 ml      Physical Exam    General:  Well appearing. Pursed lip breathing.  HEENT: Normal Neck: Supple. JVP 12+ cm. Carotids 2+ bilat; no bruits. No lymphadenopathy or thyromegaly appreciated. Cor: PMI nondisplaced. Regular rate & rhythm. No rubs, gallops or murmurs. Lungs: Decreased BS at bases with rhonchi.  Abdomen: Soft, nontender, nondistended. No hepatosplenomegaly. No bruits or masses. Good bowel sounds. Extremities: No cyanosis, clubbing, rash, edema Neuro: Alert & orientedx3, cranial nerves grossly intact. moves all 4 extremities w/o difficulty. Affect pleasant   Telemetry   NSR 70s (personally reviewed)  Labs    CBC Recent Labs    04/18/23 0021 04/19/23 0516  WBC 14.0* 8.5  HGB 9.8* 8.2*  HCT 31.5* 26.9*  MCV 89.5 89.7  PLT 322 231   Basic Metabolic Panel Recent Labs    87/56/43 1055 04/18/23 0021 04/19/23 0516  NA  --  127* 136  K  --  4.9 4.1  CL  --   96* 101  CO2  --  21* 27  GLUCOSE  --  236* 107*  BUN  --  64* 66*  CREATININE  --  2.89* 2.94*  CALCIUM  --  8.0* 8.4*  MG 2.4 2.3  --    Liver Function Tests Recent Labs    04/19/23 0516  AST 24  ALT 32  ALKPHOS 80  BILITOT 0.6  PROT 5.0*  ALBUMIN 2.6*   No results for input(s): "LIPASE", "AMYLASE" in the last 72 hours. Cardiac Enzymes No results for input(s): "CKTOTAL", "CKMB", "CKMBINDEX", "TROPONINI" in the last 72 hours.  BNP: BNP (last 3 results) Recent Labs    03/25/23 1001 04/18/23 0021  BNP 971.3* >4,500.0*    ProBNP (last 3 results) No results for input(s): "PROBNP" in the last 8760 hours.   D-Dimer Recent Labs    04/18/23 0021  DDIMER 1.59*   Hemoglobin A1C No results for input(s): "HGBA1C" in the last 72 hours. Fasting Lipid Panel No results for input(s): "CHOL", "HDL", "LDLCALC", "TRIG", "CHOLHDL", "LDLDIRECT" in the last 72 hours. Thyroid Function Tests No results for input(s): "TSH", "T4TOTAL", "T3FREE", "THYROIDAB" in the last 72 hours.  Invalid input(s): "FREET3"  Other results:   Imaging    No results found.   Medications:  Scheduled Medications:  amiodarone  200 mg Oral Daily   apixaban  2.5 mg Oral BID   arformoterol  15 mcg Nebulization BID   aspirin EC  81 mg Oral Daily   carvedilol  3.125 mg Oral BID WC   Chlorhexidine Gluconate Cloth  6 each Topical Daily   Chlorhexidine Gluconate Cloth  6 each Topical Q0600   cyanocobalamin  1,000 mcg Oral Daily   feeding supplement (NEPRO CARB STEADY)  237 mL Oral BID BM   furosemide  80 mg Intravenous BID   gabapentin  100 mg Oral QHS   hydrALAZINE  10 mg Oral Q8H   insulin aspart  0-15 Units Subcutaneous TID WC   insulin aspart  0-5 Units Subcutaneous QHS   insulin aspart  2 Units Subcutaneous TID WC   insulin glargine-yfgn  8 Units Subcutaneous QHS   isosorbide mononitrate  30 mg Oral Daily   multivitamin  1 tablet Oral BID   nystatin   Topical TID   polyethylene  glycol  17 g Oral Daily   promethazine  6.25 mg Oral QHS   revefenacin  175 mcg Nebulization Daily   rosuvastatin  10 mg Oral Once per day on Mon Wed Fri Sat   sodium bicarbonate  650 mg Oral BID    Infusions:  sodium chloride Stopped (04/14/23 1008)   anticoagulant sodium citrate      ceFAZolin (ANCEF) IV      PRN Medications: acetaminophen, ALPRAZolam, alteplase, alum & mag hydroxide-simeth, anticoagulant sodium citrate, dextromethorphan-guaiFENesin, diphenhydrAMINE, famotidine, fentaNYL (SUBLIMAZE) injection, heparin, hydrALAZINE, HYDROmorphone (DILAUDID) injection, levalbuterol, lidocaine (PF), lidocaine-prilocaine, melatonin, methylPREDNISolone (SOLU-MEDROL) injection, midazolam, ondansetron (ZOFRAN) IV, ondansetron (ZOFRAN) IV, mouth rinse, oxyCODONE, pentafluoroprop-tetrafluoroeth, phenol, senna-docusate, sodium chloride, traZODone    Patient Profile   [name] is a 88 y.o.  female with diabetes mellitus type II, hypertension, coronary artery disease, peripheral arterial disease, tremor, CVA, congestive heart failure and anemia who is admitted to [facility] on 03/25/2023 for COPD exacerbation and acute systolic CHF with initial cardiogenic shock.   Assessment/Plan   1. Acute systolic CHF: Echo in 2023 with EF 55-60%.  This admission, echo showed EF 30-35% with lateral wall motion abnomality and moderate-severe MR.  Repeat echo showed EF 35-40%.  Low output by co-ox and was on milrinone, now off. Suspected ischemic cardiomyopathy, initial troponin to the 1100s range but no cath due to AKI and no chest pain.  Treatment has been limited  by AKI on CKD stage 3, current creatinine stable at 2.94.  She had Lasix 60 mg IV x 1 yesterday.  On exam today, she is volume overloaded and remains on 5L [place].  She has h/o COPD but was not on oxygen at home.  Last CXR with pulmonary edema.  Think we will need to try to diurese her more aggressively based on high oxygen requirement/volume overload on  exam though renal function makes situation difficult.  - Lasix 80 mg IV bid today x 2 doses then assess response and renal function.  - Continue Coreg 3.125 mg bid, would not increase.  - Continue current hydralazine/Imdur, no BP room to increase.  - No further titration of GDMT with AKI and relatively soft BP.  - No SGLT2 inhibitor for now given concern for urinary incontinence and high UTI risk.  2. CAD: NSTEMI at admission with HS-TnI in 1100s range. Cath was not done due to no STEMI, no chest pain, and AKI.   - Continue ASA for now, would not continue  after discharge given Eliquis use.  - Continue Crestor 3. AKI on CKD stage 3: Baseline creatinine around 1.4-1.7, >3 this admission.  Currently relatively stable at 2.94.  - Diuresis today as above, follow creatinine closely.  4. Hypervolemic hyponatremia: Na in normal range today.  5. COPD: This complicates the picture.  Thought to have COPD exacerbation at admission.  Not on systemic steroids.  - Inhalers per primary.  - Suspect she is going to need home oxygen.  6. Atrial fibrillation: Paroxysmal.  She is in NSR.  - Continue appropriately reduced-dose Eliquis.  - Continue po amiodarone.      Length of Stay: 25  Marca Ancona, MD  04/19/2023, 8:33 AM  Advanced Heart Failure Team Pager [phone] (M-F; 7a - 5p)  Please contact [facility] for night-coverage after hours (5p -7a ) and weekends on [email or web address]

## 2023-04-19 NOTE — TOC Progression Note (Signed)
Transition of Care [facility]) - Progression Note    Patient Details  Name: Jill Hudson MRN: [id number] Date of Birth: 04/08/1935  Transition of Care [facility]) CM/SW Contact  Margarito Liner, LCSW Phone Number: 04/19/2023, 4:26 PM  Clinical Narrative:  Met with daughter at bedside. Patient asleep. Daughter confirmed plan for home hospice services. Preference is [facility] due to previous interactions with them. Sent secure chat to [employer] with [employer]. Patient will need a hospital bed, wheelchair, and oxygen. Patient will need EMS transport home. Daughter is aware she might get a bill later on. Address on facesheet is correct.   Expected Discharge Plan: Skilled Nursing Facility Barriers to Discharge: Continued Medical Work up  Expected Discharge Plan and Services       Living arrangements for the past 2 months: Single Family Home                                       Social Determinants of Health (SDOH) Interventions SDOH Screenings   Food Insecurity: No Food Insecurity (03/25/2023)  Housing: Low Risk  (03/25/2023)  Transportation Needs: No Transportation Needs (03/25/2023)  Utilities: Not At Risk (03/25/2023)  Alcohol Screen: Low Risk  (03/11/2022)  Depression (PHQ2-9): High Risk (01/01/2023)  Financial Resource Strain: Low Risk  (01/01/2023)  Physical Activity: Insufficiently Active (03/11/2022)  Social Connections: Socially Integrated (05/12/2022)  Stress: No Stress Concern Present (03/11/2022)  Tobacco Use: Medium Risk (03/25/2023)    Readmission Risk Interventions     No data to display

## 2023-04-19 NOTE — Progress Notes (Signed)
Occupational Therapy Treatment Patient Details Name: Jill Hudson MRN: [id number] DOB: 23-Jun-1935 Today's Date: 04/19/2023   History of present illness Jill Hudson is a 88yoF who presented to ED secondary to progressive SOB; admitted for management of A/C CHF exacerbation, AECOPD.  s/p thoracentesis (6/3) with removal of fluid; pending cardiac cath and TEE.   OT comments  Pt received sitting upright in recliner, verbalizing need to use the [facility] for BM. Pt on HFNC on 4L, SpO2% varied between 90-99% with activity. See flowsheet for additional details, including vital signs monitored. Pt requiring cuing for carry over of education of breathing, able to perform throughout session with min vcs. Pt performs standing from recliner with increased efforts, requiring MIN A from OT, and performs stand-pivot transfer to [facility] with RW and MIN A. Pt requires TOTAL A for pericare in standing after large BM. Returned to recliner, MOD A required due to fatigue after initial transfer and BM. Pt left in recliner, lines intact, call bell on table and needs within reach. Continues to be limited functionally by generalized weakness, decreased activity tolerance,and increased dyspnea with exertion. RN notified of BM. Pt continues to require skilled OT services to maximize PLOF and minimize risk of future falls, injury, caregiver burden and readmission. Will continue to follow OT POC.    Recommendations for follow up therapy are one component of a multi-disciplinary discharge planning process, led by the attending physician.  Recommendations may be updated based on patient status, additional functional criteria and insurance authorization.    Assistance Recommended at Discharge Intermittent Supervision/Assistance  Patient can return home with the following  A little help with walking and/or transfers;A little help with bathing/dressing/bathroom;Assistance with cooking/housework;Assist for transportation;Help with stairs or  ramp for entrance   Equipment Recommendations  BSC/3in1       Precautions / Restrictions Precautions Precautions: Fall Restrictions Weight Bearing Restrictions: No       Mobility Bed Mobility                    Transfers Overall transfer level: Needs assistance Equipment used: Rolling walker (2 wheels) Transfers: Sit to/from Stand, Bed to chair/wheelchair/BSC Sit to Stand: Min assist Stand pivot transfers: Mod assist   Step pivot transfers: Mod assist           Balance   Sitting-balance support: Feet supported                                       ADL either performed or assessed with clinical judgement   ADL Overall ADL's : Needs assistance/impaired Eating/Feeding: Set up;Sitting                       Toilet Transfer: Minimal assistance;Cueing for sequencing;Stand-pivot;BSC/3in1 Toilet Transfer Details (indicate cue type and reason): Cuing for LLE placement; pt able to push up from recliner with both UE Toileting- Clothing Manipulation and Hygiene: Total assistance;Sit to/from stand Toileting - Clothing Manipulation Details (indicate cue type and reason): Pt requires TOTAL A in standing for pericare after BM     Functional mobility during ADLs: Rolling walker (2 wheels);Min guard;Cueing for safety General ADL Comments: Fatigue limiting factor; pt only tolerates transfer from recliner to [facility] and TOTAL A for pericare before needing to return to recliner d/t c/o exhaustion      Cognition Arousal/Alertness: Awake/alert Behavior During Therapy: [facility] for tasks assessed/performed Overall Cognitive  Status: Within Functional Limits for tasks assessed                                 General Comments: Pt very fatigued with efforts; requiring increased rest breaks and cuing for breathing                   Pertinent Vitals/ Pain       Pain Assessment Pain Assessment: No/denies pain   Frequency  Min 2X/week         Progress Toward Goals  OT Goals(current goals can now be found in the care plan section)  Progress towards OT goals: Progressing toward goals  Acute Rehab OT Goals Time For Goal Achievement: 04/26/23 Potential to Achieve Goals: Good  Plan Frequency remains appropriate;Discharge plan remains appropriate    Co-evaluation                 AM-PAC OT "6 Clicks" Daily Activity     Outcome Measure   Help from another person eating meals?: None Help from another person taking care of personal grooming?: A Little Help from another person toileting, which includes using toliet, bedpan, or urinal?: A Lot Help from another person bathing (including washing, rinsing, drying)?: A Lot Help from another person to put on and taking off regular upper body clothing?: A Little Help from another person to put on and taking off regular lower body clothing?: A Lot 6 Click Score: 16    End of Session Equipment Utilized During Treatment: Rolling walker (2 wheels);Gait belt;Oxygen  OT Visit Diagnosis: Unsteadiness on feet (R26.81);Muscle weakness (generalized) (M62.81);History of falling (Z91.81)   Activity Tolerance Patient limited by fatigue (BP 132/91 (105) end of session seated in recliner, SPO2% on HFNC 4L 90-99% with efforts, HR 77)   Patient Left with call bell/phone within reach;with chair alarm set;in chair   Nurse Communication Other (comment) (BM on [facility])        Time: 1610-9604 OT Time Calculation (min): 38 min  Charges: OT General Charges $OT Visit: 1 Visit OT Treatments $Self Care/Home Management : 38-52 mins  [name], OTR/L  04/19/23, 9:58 AM

## 2023-04-19 NOTE — Progress Notes (Addendum)
Daily Progress Note   Patient Name: Jill Hudson       Date: 04/19/2023 DOB: 03-Aug-1935  Age: 88 y.o. MRN#: [id number] Attending Physician: Arnetha Courser, MD Primary Care Physician: Reubin Milan, MD Admit Date: 03/25/2023  Reason for Consultation/Follow-up: Establishing goals of care  Subjective: Notes and labs reviewed.  In to see patient.  Daughter is at bedside patient is sitting in bedside chair, bolt upright.  She has a fan blowing on her.  Patient is on telemetry and O2 sat is reading in the mid to high 90s.  Patient states she feels short of breath.    They discussed the events of the weekend.  Daughter discusses requiring BiPAP; she discusses the Lasix dosing.    PT/OT into bedside to work with patient.  Patient advises that she just wants to get back into bed.  Patient was able to stand and pivot to sit on the bed, complaining of shortness of breath and wanting to lay down.  We discussed her diagnoses and how they affect each other.  We discussed medical care in the hospital. Discussed her prognosis, GOC, EOL wishes disposition and options.  Created space and opportunity for patient  to explore thoughts and feelings regarding current medical information. She states her mother has told her that she is ready to go home, and states she means heaven.  A detailed discussion was had today regarding advanced directives.  Concepts specific to code status, artifical feeding and hydration, IV antibiotics and rehospitalization were discussed.  The difference between an aggressive medical intervention path and a comfort care path was discussed.  Values and goals of care important to patient and family were attempted to be elicited.  Discussed limitations of medical interventions to prolong  quality of life in some situations and discussed the concept of human mortality.  Patient advises that she is ready to go home.  She states she wants to be with her family and with her pets to spend what time she has left on this earth.  She and her daughter discusses her shortness of breath and her weakness.  She discusses that her quality of life is not acceptable and she is just waiting for the Lord to take her home.  She states she would like to focus on her comfort and dignity for what  time she has left.  Daughter states she would want her mother to die at home.  Offered them the chance to speak with other family members. Daughter states she is unsure of if or when patient's son who is a neurologist in [place] will come to the bedside.  Patient and daughter both state they are ready to make the decision for home with hospice.   Patient would like 24 to 48 hours of medical optimization and discharge home with hospice.  I completed a MOST form today with patient and daughter, and the signed original was placed in the chart. The form was scanned and sent to medical records for it to be uploaded under ACP tab in Epic. A photocopy was also placed in the chart to be scanned into EMR. The patient outlined their wishes for the following treatment decisions:  Cardiopulmonary Resuscitation: Do Not Attempt Resuscitation (DNR/No CPR)  Medical Interventions: Comfort Measures: Keep clean, warm, and dry. Use medication by any route, positioning, wound care, and other measures to relieve pain and suffering. Use oxygen, suction and manual treatment of airway obstruction as needed for comfort. Do not transfer to the hospital unless comfort needs cannot be met in current location.  Antibiotics: Determine use of limitation of antibiotics when infection occurs  IV Fluids: No IV fluids (provide other measures to ensure comfort)  Feeding Tube: No feeding tube    Length of Stay: 25  Current Medications: Scheduled  Meds:   amiodarone  200 mg Oral Daily   apixaban  2.5 mg Oral BID   arformoterol  15 mcg Nebulization BID   aspirin EC  81 mg Oral Daily   carvedilol  3.125 mg Oral BID WC   Chlorhexidine Gluconate Cloth  6 each Topical Daily   Chlorhexidine Gluconate Cloth  6 each Topical Q0600   cyanocobalamin  1,000 mcg Oral Daily   feeding supplement (NEPRO CARB STEADY)  237 mL Oral BID BM   furosemide  80 mg Intravenous BID   gabapentin  100 mg Oral QHS   hydrALAZINE  10 mg Oral Q8H   insulin aspart  0-15 Units Subcutaneous TID WC   insulin aspart  0-5 Units Subcutaneous QHS   insulin aspart  2 Units Subcutaneous TID WC   insulin glargine-yfgn  8 Units Subcutaneous QHS   isosorbide mononitrate  30 mg Oral Daily   multivitamin  1 tablet Oral BID   nystatin   Topical TID   polyethylene glycol  17 g Oral Daily   promethazine  6.25 mg Oral QHS   revefenacin  175 mcg Nebulization Daily   rosuvastatin  10 mg Oral Once per day on Mon Wed Fri Sat   sodium bicarbonate  650 mg Oral BID    Continuous Infusions:  sodium chloride Stopped (04/14/23 1008)   anticoagulant sodium citrate      PRN Meds: acetaminophen, ALPRAZolam, alteplase, alum & mag hydroxide-simeth, anticoagulant sodium citrate, dextromethorphan-guaiFENesin, diphenhydrAMINE, heparin, hydrALAZINE, levalbuterol, lidocaine (PF), lidocaine-prilocaine, melatonin, midazolam, ondansetron (ZOFRAN) IV, ondansetron (ZOFRAN) IV, mouth rinse, oxyCODONE, pentafluoroprop-tetrafluoroeth, phenol, senna-docusate, sodium chloride, traZODone  Physical Exam Pulmonary:     Comments: Appears short of breath.  Oxygen saturation in mid to high 90s. Neurological:     Mental Status: She is alert.             Vital Signs: BP (!) 140/45   Pulse 65   Temp 98 F (36.7 C) (Oral)   Resp 17   Ht 5' (1.524 m)   Wt 63  kg   SpO2 98%   BMI 27.13 kg/m  SpO2: SpO2: 98 % O2 Device: O2 Device: Nasal Cannula O2 Flow Rate: O2 Flow Rate (L/min): 4  L/min  Intake/output summary:  Intake/Output Summary (Last 24 hours) at 04/19/2023 1352 Last data filed at 04/19/2023 1000 Gross per 24 hour  Intake 720 ml  Output 650 ml  Net 70 ml   LBM: Last BM Date : 04/19/23 Baseline Weight: Weight: 56.2 kg Most recent weight: Weight: 63 kg     Patient Active Problem List   Diagnosis Date Noted   Acute urinary retention 04/04/2023   Community acquired pneumonia 04/03/2023   Hyperkalemia 04/03/2023   Pleural effusion 04/02/2023   Acute on chronic systolic CHF (congestive heart failure) (HCC) 03/31/2023   Paroxysmal atrial fibrillation (HCC) 03/31/2023   Hypokalemia 03/31/2023   Cardiogenic shock (HCC) 03/28/2023   COPD exacerbation (HCC) 03/25/2023   HLD (hyperlipidemia) 03/25/2023   Type II diabetes mellitus with renal manifestations (HCC) 03/25/2023   CAD (coronary artery disease) 03/25/2023   Iron deficiency anemia 03/25/2023   PAD (peripheral artery disease) (HCC) 03/25/2023   Acquired thrombophilia (HCC) 03/20/2021   Mood disorder (HCC) 05/02/2020   Chronic heart failure with preserved ejection fraction (HCC) 04/12/2020   Diverticulosis of large intestine without perforation or abscess with bleeding 03/27/2020   COPD (chronic obstructive pulmonary disease) with chronic bronchitis    NSTEMI (non-ST elevated myocardial infarction) (HCC)    Acute respiratory failure with hypoxia (HCC) 02/18/2020   CKD (chronic kidney disease), stage IIIa 02/18/2020   Hyponatremia 02/18/2020   Malnutrition of mild degree (HCC) 01/08/2020   Acute kidney injury superimposed on CKD (HCC)    Age-related macular degeneration, dry, left eye 06/21/2019   Age-related macular degeneration, wet, right eye (HCC) 06/21/2019   Moderate nonproliferative diabetic retinopathy associated with type 2 diabetes mellitus (HCC) 06/21/2019   Lumbosacral radiculopathy at L4 05/01/2019   Underweight 05/01/2019   Encounter for long-term (current) use of aspirin 10/31/2018    Long term current use of oral hypoglycemic drug 10/31/2018   Encounter for long-term (current) use of insulin (HCC) 10/31/2018   Peptic ulcer disease 08/11/2018   Leg pain 07/03/2017   Carpal tunnel syndrome on both sides 05/05/2017   Myalgia due to HMG CoA reductase inhibitor 05/05/2017   History of CVA (cerebrovascular accident) 02/15/2017   Degenerative disc disease, lumbar 12/21/2016   Atherosclerosis of native arteries of extremity with intermittent claudication (HCC) 12/20/2016   Bilateral carotid artery stenosis 12/20/2016   Occlusion and stenosis of bilateral carotid arteries 12/20/2016   Hip bursitis 05/18/2016   Elevated TSH 01/18/2016   CKD stage 3 due to type 2 diabetes mellitus (HCC) 01/16/2016   Senile ecchymosis 01/16/2016   Type II diabetes mellitus with complication (HCC)    GERD (gastroesophageal reflux disease) 07/24/2015   PVD (peripheral vascular disease) (HCC) 05/17/2015   Neoplasm of uncertain behavior of skin 05/17/2015   Retinopathy, diabetic, proliferative (HCC) 04/11/2015   Hyperlipidemia associated with type 2 diabetes mellitus (HCC) 04/11/2015   Essential hypertension 04/11/2015   Generalized OA 04/11/2015   Proliferative diabetic retinopathy(362.02) 04/11/2015   Arteriosclerosis of coronary artery 05/29/2013   Hypertensive heart disease without CHF 05/29/2013    Palliative Care Assessment & Plan    Recommendations/Plan: Patient would like 24 to 48 hours of medical optimization and then discharge home with hospice.  Code Status:    Code Status Orders  (From admission, onward)  Start     Ordered   03/31/23 1502  Do not attempt resuscitation (DNR)  Continuous       Question Answer Comment  If patient has no pulse and is not breathing Do Not Attempt Resuscitation   If patient has a pulse and/or is breathing: Medical Treatment Goals LIMITED ADDITIONAL INTERVENTIONS: Use medication/IV fluids and cardiac monitoring as indicated; Do  not use intubation or mechanical ventilation (DNI), also provide comfort medications.  Transfer to Progressive/Stepdown as indicated, avoid Intensive Care.   Consent: Discussion documented in EHR or advanced directives reviewed      03/31/23 1501           Code Status History     Date Active Date Inactive Code Status Order ID Comments User Context   03/25/2023 1214 03/31/2023 1501 Full Code [id number]  Lorretta Harp, MD ED   02/18/2020 1059 02/26/2020 2310 Full Code [id number]  Lorretta Harp, MD ED   12/25/2019 0555 12/28/2019 2204 Full Code [id number]  Mansy, Vernetta Honey, MD ED   10/12/2018 1647 10/15/2018 1808 Full Code [id number]  Schnier, Latina Craver, MD Inpatient   09/16/2018 1254 09/16/2018 2048 Full Code [id number]  Gilda Crease, Latina Craver, MD Inpatient   08/11/2018 1706 08/13/2018 2136 Full Code [id number]  Bertrum Sol, MD Inpatient   07/26/2018 0921 07/26/2018 1652 Full Code [id number]  Renford Dills, MD Inpatient   08/23/2017 0935 08/23/2017 1419 Full Code [id number]  Annice Needy, MD Inpatient   02/15/2017 1538 02/16/2017 1959 Full Code [id number]  Auburn Bilberry, MD Inpatient       Prognosis:  < 6 months   Care plan was discussed with PT/OT, TOC.  Epic chat sent to attending.  Thank you for allowing the Palliative Medicine Team to assist in the care of this patient.    Morton Stall, NP  Please contact Palliative Medicine Team phone at [phone] for questions and concerns.

## 2023-04-19 NOTE — Progress Notes (Signed)
Progress Note   Patient: Jill Hudson:[id number] DOB: [birth year] DOA: 03/25/2023     25 DOS: the patient was seen and examined on 04/19/2023 at 9:23AM   Brief hospital course: 88 year old female with history of COPD, HTN, HLD, DM2, stroke, TIA, GERD, anxiety, PVD, CAD, MI, former tobacco use, CKD stage III, GI bleed, iron deficiency anemia, PAD status post PCI comes to the hospital with shortness of breath and hypoxia. Patient was found to have bilateral pulmonary edema /effusion with elevated BNP.  Patient is admitted for further evaluation.  Cardiology is consulted.  Patient will eventually need right and left heart cath pending improvement in renal functions.   6/5.  Patient feeling a little bit better than when she came in.  On heparin drip, milrinone drip, Lasix drip for cardiogenic shock.  Amiodarone drip added for atrial fibrillation. 6/6.  Now on torsemide instead of Lasix drip.  Creatinine down to 2.16 6/7.  Cardiology decided to discontinue milrinone drip.  Switching off amiodarone drip.  Converting heparin drip over to Eliquis.  Creatinine up to 2.31.  Patient in normal sinus rhythm. 6/8.  Creatinine up to 2.5 and potassium 5.3.  Lokelma ordered.  Potassium supplementation discontinued 6/9.  Creatinine up at 2.63.  Needed in and out catheterization this morning. 6/10.  Creatinine 2.97, transfer out of ICU to progressive care.  Urinating well. 6/11.  Creatinine 3.15 with a GFR 14.  Nephrology not sure if she will tolerate dialysis.  6/24: Stable on 5 L of oxygen, no baseline oxygen use.  Stable renal function, creatinine peaked at 3.8.  Nephrology managing diuretics.  Procalcitonin elevated but leukocytosis has resolved.  Palliative met with daughter and shifting to comfort focused care.  Need to optimize medical management before discharge.  Assessment and Plan: * Cardiogenic shock (HCC) Acute on chronic systolic congestive heart failure Acute respiratory failure with hypoxia due  to CHF Ischemic cardiomyopathy EF 30 to 35% this admission.  Cardiology was consulted, she was on milrinone for a time during this admission.  Suspected to have ischemic cardiomyopathy.  Catheterization not possible due to renal function.  Patient was initially transition to p.o. diuretic but redeveloped pulmonary edema, IV Lasix was restarted. Nephrology would like to continue with IV diuresis for another day and then decide  - Monitor potassium and renal function - Consult nephrology, appreciate recommendations - Continue carvedilol, hydralazine, Imdur    Acute kidney injury superimposed on CKD IIIb CKD IIIa ruled out.   Baseline creatinine looks to be about 1.4-1.7 over the last few years.  Here her creatinine is worsened with cardiogenic shock and diuresis up to 3.8 peak.  More recently it is trended down to the high 2s.  GFR seems stable today  Discussed with Nephrology, they feel it may be necessary to push diuresis to allow better management of CHF symptoms, at expense of renal function. - Diuretics per Nephrology -Bicarbonate tablets per nephrology  Paroxysmal atrial fibrillation -Continue amiodarone, Eliquis  NSTEMI (non-ST elevated myocardial infarction) (HCC) Given the drop in ejection fraction, this was presumed to be ischemic cardiomyopathy.  Cardiology were consulted and catheterization was deferred due to renal dysfunction. - Continue aspirin, beta-blocker, statin  Type II diabetes mellitus with renal manifestations (HCC) Hemoglobin A1c 7.9%.  Sugars are well-controlled - Continue Semglee -Continue sliding scale corrections  COPD exacerbation (HCC) No significant wheezing on exam today -Continue Yupelri, Xopenex, Brovana  Essential hypertension History of CVA (cerebrovascular accident) HLD (hyperlipidemia) Blood pressure controlled - Continue Imdur, carvedilol, hydralazine  Hyponatremia Resolved today.  Sodium at 136 - Diuretics as above  Iron deficiency  anemia Hgb stable, no clinical bleeding  Acute urinary retention Resolved  Hyperkalemia Resolved  Pleural effusion Right thoracentesis removed 700 mL of fluid.  This was done by interventional radiology on 6/3.    Hypokalemia Resolved  Subjective: Overnight seems stable, no shortness of breath today.  Sitting comfortably in chair.  Remained on 4 L of oxygen with no baseline oxygen use.  Physical Exam: BP (!) 140/45   Pulse 65   Temp 98 F (36.7 C) (Oral)   Resp 17   Ht 5' (1.524 m)   Wt 63 kg   SpO2 98%   BMI 27.13 kg/m    General.  Frail elderly lady, in no acute distress. Pulmonary.  Lungs clear bilaterally, normal respiratory effort. CV.  Regular rate and rhythm, no JVD, rub or murmur. Abdomen.  Soft, nontender, nondistended, BS positive. CNS.  Alert and oriented .  No focal neurologic deficit. Extremities.  No edema, no cyanosis, pulses intact and symmetrical. Psychiatry.  Judgment and insight appears normal.   Data Reviewed: Prior data reviewed  Family Communication: Discussed with daughter on phone.  Disposition: Status is: Inpatient The patient was admitted for congestive heart failure.  She did require milrinone for time but was stabilized  We are working with palliative care, trying to determine discharge plans,, with the patient and worsening respiratory status overnight   Nephrology will titrate diuretics over the next few days in the hospital, possibly to rehab by Tuesday or Wednesday  DVT Prophylaxis. Eliquis  Author: Arnetha Courser, MD 04/19/2023 1:51 PM  For on call review [email or web address].

## 2023-04-19 NOTE — Progress Notes (Signed)
Physical Therapy Treatment Patient Details Name: Jill Hudson MRN: [id number] DOB: 1935-04-03 Today's Date: 04/19/2023   History of Present Illness Jill Hudson is a 88yoF who presented to ED secondary to progressive SOB; admitted for management of A/C CHF exacerbation, AECOPD.  s/p thoracentesis (6/3) with removal of fluid; pending cardiac cath and TEE.    PT Comments    Pt received in recliner with c/o sore buttocks requesting return to bed. Pt continues to demonstrate significant DOE with minimal activity requiring frequent rest breaks. Currently on 4L HFNC with SpO2 98%. Increased functional assist needed to stand and side step to bed without AD due to poor activity tolerance and fatigue from receiving Xanax recently. Pt positioned to comfort in bed, daughter and MD at bedside. Will progress as tolerated and appropriate.   Recommendations for follow up therapy are one component of a multi-disciplinary discharge planning process, led by the attending physician.  Recommendations may be updated based on patient status, additional functional criteria and insurance authorization.  Follow Up Recommendations  Can patient physically be transported by private vehicle: No    Assistance Recommended at Discharge Frequent or constant Supervision/Assistance  Patient can return home with the following A lot of help with walking and/or transfers;A little help with bathing/dressing/bathroom;Assist for transportation   Equipment Recommendations  Other (comment) (TBD at next facility)    Recommendations for Other Services       Precautions / Restrictions Precautions Precautions: Fall Precaution Comments: Increased O2 needs 4L HFNC Restrictions Weight Bearing Restrictions: No     Mobility  Bed Mobility Overal bed mobility: Needs Assistance Bed Mobility: Sit to Supine       Sit to supine: Total assist        Transfers Overall transfer level: Needs assistance Equipment used:  None Transfers: Sit to/from Stand Sit to Stand: Mod assist   Step pivot transfers: Mod assist       General transfer comment: lifting and lowering assistance provided. verbal cues for hand placement and anterior weight shifting    Ambulation/Gait               General Gait Details:  (Unable to tolerate gait training)   Stairs             Wheelchair Mobility    Modified Rankin (Stroke Patients Only)       Balance Overall balance assessment: Needs assistance Sitting-balance support: Feet supported Sitting balance-Leahy Scale: Good Sitting balance - Comments:  (Several minutes sitting EOB due to SOB, SpO2 98%, pt DOE)   Standing balance support: Reliant on assistive device for balance, Bilateral upper extremity supported Standing balance-Leahy Scale: Poor                              Cognition Arousal/Alertness: Awake/alert Behavior During Therapy: WFL for tasks assessed/performed Overall Cognitive Status: Within Functional Limits for tasks assessed                                 General Comments: Pt very fatigued with efforts; requiring increased rest breaks and cuing for breathing        Exercises      General Comments General comments (skin integrity, edema, etc.): Pt with poor tolerance for activity due to significant fatigue upon minimal exertion      Pertinent Vitals/Pain Pain Assessment Pain Assessment: Faces Faces Pain Scale: Hurts  little more Pain Location: bottom Pain Descriptors / Indicators: Discomfort, Sore Pain Intervention(s): Repositioned, Other (comment) (will look into seatcushioning)    Home Living                          Prior Function            PT Goals (current goals can now be found in the care plan section) Acute Rehab PT Goals Patient Stated Goal: to go home    Frequency    Min 3X/week      PT Plan Current plan remains appropriate    Co-evaluation               AM-PAC PT "6 Clicks" Mobility   Outcome Measure  Help needed turning from your back to your side while in a flat bed without using bedrails?: A Lot Help needed moving from lying on your back to sitting on the side of a flat bed without using bedrails?: A Lot Help needed moving to and from a bed to a chair (including a wheelchair)?: A Lot   Help needed to walk in hospital room?: A Lot Help needed climbing 3-5 steps with a railing? : A Lot 6 Click Score: 10    End of Session Equipment Utilized During Treatment: Gait belt;Oxygen (4L HFNC) Activity Tolerance: Patient limited by lethargy Patient left: in bed;with call bell/phone within reach;with bed alarm set;with family/visitor present Nurse Communication: Mobility status PT Visit Diagnosis: Muscle weakness (generalized) (M62.81);Difficulty in walking, not elsewhere classified (R26.2)     Time: 0981-1914 PT Time Calculation (min) (ACUTE ONLY): 21 min  Charges:  $Therapeutic Activity: 8-22 mins                    Zadie Cleverly, PTA  Jannet Askew 04/19/2023, 1:18 PM

## 2023-04-19 NOTE — Progress Notes (Signed)
Civil engineer, contracting [facility]) Hospital Liaison Note  Received request from Charlynn Court, LCSW , Transitions of Care Manager, for hospice services at home after discharge.  Spoke with daughter to initiate education related to hospice philosophy, services, and team approach to care. Daughter verbalized understanding of information given.  Per discussion, the plan is for discharge home by EMS in the next 24-48 hrs.   DME needs discussed.  Patient has the following equipment in the home: [facility] and [employer]. Patient/family requests the following equipment in the home:  Hospital Bed, [facility] and home 02.   The address has been verified and is correct in the chart. Maralyn Sago Gains and phone number [phone] is the family contact to arrange time of equipment delivery.    Please send signed and completed DNR home with the patient/family.  Please provide prescriptions at discharge as needed to ensure ongoing symptom management.   [facility] information and contact numbers given to daughter.   Above information shared with Geraldo Docker, Transitions of Care Manager.     Please call with any Hospice related questions or concerns.  Thank you for the opportunity to participate in this patient's care.  Redge Gainer, [facility] Liaison [phone]

## 2023-04-19 NOTE — Progress Notes (Signed)
Patient states his abdominal adjustments at all level. [facility]  ROUNDING NOTE   Subjective:   [name] was admitted to [facility] on 03/25/2023 for COPD exacerbation [facility]) [J44.1] Chest pain [R07.9] Community acquired pneumonia, unspecified laterality [J18.9]  Patient seen sitting up in the chair RN at bedside with am meds Appetite remains poor  4L [place] No lower extremity edema   Objective:  Vital signs in last 24 hours:  Temp:  [98 F (36.7 C)-98.6 F (37 C)] 98 F (36.7 C) (06/24 1200) Pulse Rate:  [47-75] 71 (06/24 1200) Resp:  [13-22] 16 (06/24 1200) BP: (95-136)/(39-109) 121/109 (06/24 1200) SpO2:  [94 %-100 %] 94 % (06/24 1200) Weight:  [63 kg] 63 kg (06/24 0500)  Weight change:  Filed Weights   04/16/23 0840 04/17/23 0500 04/19/23 0500  Weight: 60.6 kg 62.5 kg 63 kg    Intake/Output: I/O last 3 completed shifts: In: 720 [P.O.:720] Out: 1550 [Urine:1550]   Intake/Output this shift:  Total I/O In: 240 [P.O.:240] Out: -   Physical Exam: General: NAD, laying in bed  Head: Normocephalic, atraumatic. Moist oral mucosal membranes  Eyes: Anicteric  Lungs:  Diminished in bases, [place] 4L  Heart: regular  Abdomen:  Soft, nontender  Extremities:  No peripheral edema.  Neurologic: Alert and oriented to self, moving all four extremities  Skin: warm   Access:  none    Basic Metabolic Panel: Recent Labs  Lab 04/14/23 0801 04/15/23 0832 04/16/23 0933 04/17/23 1055 04/18/23 0021 04/19/23 0516  NA 136 134* 133*  --  127* 136  K 4.8 4.8 4.6  --  4.9 4.1  CL 104 103 101  --  96* 101  CO2 22 22 23   --  21* 27  GLUCOSE 93 143* 122*  --  236* 107*  BUN 65* 63* 65*  --  64* 66*  CREATININE 3.13* 3.00* 3.07*  --  2.89* 2.94*  CALCIUM 8.2* 8.2* 8.3*  --  8.0* 8.4*  MG  --   --   --  2.4 2.3  --   PHOS  --  4.4  --   --   --   --      Liver Function Tests: Recent Labs  Lab 04/15/23 0832 04/19/23 0516  AST  --  24  ALT  --  32  ALKPHOS   --  80  BILITOT  --  0.6  PROT  --  5.0*  ALBUMIN 2.7* 2.6*    No results for input(s): "LIPASE", "AMYLASE" in the last 168 hours. No results for input(s): "AMMONIA" in the last 168 hours.  CBC: Recent Labs  Lab 04/13/23 0437 04/14/23 0801 04/18/23 0021 04/19/23 0516  WBC 8.3 8.9 14.0* 8.5  HGB 8.7* 8.8* 9.8* 8.2*  HCT 27.7* 28.4* 31.5* 26.9*  MCV 88.5 89.0 89.5 89.7  PLT 235 229 322 231     Cardiac Enzymes: No results for input(s): "CKTOTAL", "CKMB", "CKMBINDEX", "TROPONINI" in the last 168 hours.  BNP: Invalid input(s): "POCBNP"  CBG: Recent Labs  Lab 04/18/23 1302 04/18/23 1741 04/18/23 2158 04/19/23 0800 04/19/23 1102  GLUCAP 160* 120* 165* 88 174*     Microbiology: Results for orders placed or performed during the hospital encounter of 03/25/23  Blood Culture (routine x 2)     Status: None   Collection Time: 03/25/23 12:09 PM   Specimen: BLOOD  Result Value Ref Range Status   Specimen Description BLOOD LEFT [facility]  Final   Special Requests  Final    BOTTLES DRAWN AEROBIC AND ANAEROBIC Blood Culture adequate volume   Culture   Final    NO GROWTH 5 DAYS Performed at [facility], [address] [place], [place] [address]    Report Status 03/30/2023 FINAL  Final  Blood Culture (routine x 2)     Status: None   Collection Time: 03/25/23 12:10 PM   Specimen: BLOOD  Result Value Ref Range Status   Specimen Description BLOOD LEFT FA  Final   Special Requests   Final    BOTTLES DRAWN AEROBIC AND ANAEROBIC Blood Culture adequate volume   Culture   Final    NO GROWTH 5 DAYS Performed at [facility], [address], [place], [place] [address]    Report Status 03/30/2023 FINAL  Final  SARS Coronavirus 2 by RT PCR (hospital order, performed in [facility] hospital lab) *cepheid single result test* Anterior Nasal Swab     Status: None   Collection Time: 03/25/23  1:18 PM   Specimen: Anterior Nasal Swab  Result Value Ref Range Status    SARS Coronavirus 2 by RT PCR NEGATIVE NEGATIVE Final    Comment: (NOTE) SARS-CoV-2 target nucleic acids are NOT DETECTED.  The SARS-CoV-2 RNA is generally detectable in upper and lower respiratory specimens during the acute phase of infection. The lowest concentration of SARS-CoV-2 viral copies this assay can detect is 250 copies / mL. A negative result does not preclude SARS-CoV-2 infection and should not be used as the sole basis for treatment or other patient management decisions.  A negative result may occur with improper specimen collection / handling, submission of specimen other than nasopharyngeal swab, presence of viral mutation(s) within the areas targeted by this assay, and inadequate number of viral copies (<250 copies / mL). A negative result must be combined with clinical observations, patient history, and epidemiological information.  Fact Sheet for Patients:   RoadLapTop.co.za  Fact Sheet for Healthcare Providers: http://kim-miller.com/  This test is not yet approved or  cleared by the [place] FDA and has been authorized for detection and/or diagnosis of SARS-CoV-2 by FDA under an Emergency Use Authorization (EUA).  This EUA will remain in effect (meaning this test can be used) for the duration of the COVID-19 declaration under Section 564(b)(1) of the Act, 21 U.S.C. section 360bbb-3(b)(1), unless the authorization is terminated or revoked sooner.  Performed at [facility], [address] [place], [place] [address]   MRSA Next Gen by PCR, Nasal     Status: None   Collection Time: 03/27/23  4:40 AM   Specimen: Nasal Mucosa; Nasal Swab  Result Value Ref Range Status   MRSA by PCR Next Gen NOT DETECTED NOT DETECTED Final    Comment: (NOTE) The GeneXpert MRSA Assay (FDA approved for NASAL specimens only), is one component of a comprehensive MRSA colonization surveillance program. It is not intended to  diagnose MRSA infection nor to guide or monitor treatment for MRSA infections. Test performance is not FDA approved in patients less than 32 years old. Performed at [facility], [address] [place], [place] [address]   Respiratory (~20 pathogens) panel by PCR     Status: None   Collection Time: 03/28/23  4:00 PM   Specimen: Nasopharyngeal Swab; Respiratory  Result Value Ref Range Status   Adenovirus NOT DETECTED NOT DETECTED Final   Coronavirus 229E NOT DETECTED NOT DETECTED Final    Comment: (NOTE) The Coronavirus on the Respiratory Panel, DOES  NOT test for the novel  Coronavirus (2019 nCoV)    Coronavirus HKU1 NOT DETECTED NOT DETECTED Final   Coronavirus NL63 NOT DETECTED NOT DETECTED Final   Coronavirus OC43 NOT DETECTED NOT DETECTED Final   Metapneumovirus NOT DETECTED NOT DETECTED Final   Rhinovirus / Enterovirus NOT DETECTED NOT DETECTED Final   Influenza A NOT DETECTED NOT DETECTED Final   Influenza B NOT DETECTED NOT DETECTED Final   Parainfluenza Virus 1 NOT DETECTED NOT DETECTED Final   Parainfluenza Virus 2 NOT DETECTED NOT DETECTED Final   Parainfluenza Virus 3 NOT DETECTED NOT DETECTED Final   Parainfluenza Virus 4 NOT DETECTED NOT DETECTED Final   Respiratory Syncytial Virus NOT DETECTED NOT DETECTED Final   Bordetella pertussis NOT DETECTED NOT DETECTED Final   Bordetella Parapertussis NOT DETECTED NOT DETECTED Final   Chlamydophila pneumoniae NOT DETECTED NOT DETECTED Final   Mycoplasma pneumoniae NOT DETECTED NOT DETECTED Final    Comment: Performed at [facility], [address] [place], [place] [address]  Body fluid culture w Gram Stain     Status: None   Collection Time: 03/29/23 10:27 AM   Specimen: PATH Cytology Pleural fluid  Result Value Ref Range Status   Specimen Description   Final    PLEURAL Performed at [facility], [address], [place], [place] [address]    Special Requests   Final    NONE Performed at  [facility], [address] [place], [place] [address]    Gram Stain NO WBC SEEN NO ORGANISMS SEEN   Final   Culture   Final    NO GROWTH 3 DAYS Performed at [facility], [address], [place], [place] [address]    Report Status 04/01/2023 FINAL  Final  MRSA Next Gen by PCR, Nasal     Status: None   Collection Time: 03/29/23  3:00 PM   Specimen: Nasal Mucosa; Nasal Swab  Result Value Ref Range Status   MRSA by PCR Next Gen NOT DETECTED NOT DETECTED Final    Comment: (NOTE) The GeneXpert MRSA Assay (FDA approved for NASAL specimens only), is one component of a comprehensive MRSA colonization surveillance program. It is not intended to diagnose MRSA infection nor to guide or monitor treatment for MRSA infections. Test performance is not FDA approved in patients less than 54 years old. Performed at [facility], [address] [place], [place] [address]   Group A Strep by PCR     Status: None   Collection Time: 03/30/23  6:00 PM   Specimen: Throat; Sterile Swab  Result Value Ref Range Status   Group A Strep by PCR NOT DETECTED NOT DETECTED Final    Comment: Performed at [facility], [address] [place], [place] [address]    Coagulation Studies: No results for input(s): "LABPROT", "INR" in the last 72 hours.   Urinalysis: No results for input(s): "COLORURINE", "LABSPEC", "PHURINE", "GLUCOSEU", "HGBUR", "BILIRUBINUR", "KETONESUR", "PROTEINUR", "UROBILINOGEN", "NITRITE", "LEUKOCYTESUR" in the last 72 hours.  Invalid input(s): "APPERANCEUR"    Imaging: DG Chest Port 1 View  Result Date: 04/17/2023 CLINICAL DATA:  Shortness of breath EXAM: PORTABLE CHEST 1 VIEW COMPARISON:  04/02/2023 FINDINGS: Single frontal view of the chest demonstrates stable enlargement of the cardiac silhouette. There is increased central vascular congestion, with progressive bilateral veiling opacities consistent with effusions and underlying consolidation. No  pneumothorax. No acute bony abnormalities. IMPRESSION: 1. Worsening volume status, with progressive vascular congestion, increased airspace disease,  and enlarging bilateral pleural effusions. Electronically Signed   By: Sharlet Salina M.D.   On: 04/17/2023 23:31     Medications:    sodium chloride Stopped (04/14/23 1008)   anticoagulant sodium citrate       amiodarone  200 mg Oral Daily   apixaban  2.5 mg Oral BID   arformoterol  15 mcg Nebulization BID   aspirin EC  81 mg Oral Daily   carvedilol  3.125 mg Oral BID WC   Chlorhexidine Gluconate Cloth  6 each Topical Daily   Chlorhexidine Gluconate Cloth  6 each Topical Q0600   cyanocobalamin  1,000 mcg Oral Daily   feeding supplement (NEPRO CARB STEADY)  237 mL Oral BID BM   furosemide  80 mg Intravenous BID   gabapentin  100 mg Oral QHS   hydrALAZINE  10 mg Oral Q8H   insulin aspart  0-15 Units Subcutaneous TID WC   insulin aspart  0-5 Units Subcutaneous QHS   insulin aspart  2 Units Subcutaneous TID WC   insulin glargine-yfgn  8 Units Subcutaneous QHS   isosorbide mononitrate  30 mg Oral Daily   multivitamin  1 tablet Oral BID   nystatin   Topical TID   polyethylene glycol  17 g Oral Daily   promethazine  6.25 mg Oral QHS   revefenacin  175 mcg Nebulization Daily   rosuvastatin  10 mg Oral Once per day on Mon Wed Fri Sat   sodium bicarbonate  650 mg Oral BID   acetaminophen, ALPRAZolam, alteplase, alum & mag hydroxide-simeth, anticoagulant sodium citrate, dextromethorphan-guaiFENesin, diphenhydrAMINE, heparin, hydrALAZINE, levalbuterol, lidocaine (PF), lidocaine-prilocaine, melatonin, midazolam, ondansetron (ZOFRAN) IV, ondansetron (ZOFRAN) IV, mouth rinse, oxyCODONE, pentafluoroprop-tetrafluoroeth, phenol, senna-docusate, sodium chloride, traZODone  Assessment/ Plan:  [name] is a 88 y.o.  female with diabetes mellitus type II, hypertension, coronary artery disease, peripheral arterial disease, tremor, CVA, congestive  heart failure , anxiety, peripheral arterial diease and anemia who is admitted to [facility] on 03/25/2023 for COPD exacerbation (HCC) [J44.1] Chest pain [R07.9] Community acquired pneumonia, unspecified laterality [J18.9]  Acute kidney injury with acute metabolic acidosis on chronic kidney disease stage IIIB: baseline creatinine of 1.48, GFR of 34 on 12/09/2022. Acute kidney injury secondary to acute cardiorenal syndrome. Chronic kidney disease secondary to hypertension.  Creatinine remains stable  Continue IV diuretics twice daily Continue sodium bicarbonate Low threshold for renal replacement therapy.  Will continue to follow patient at discharge  Hypertension with chronic kidney disease: with chronic systolic and diastolic congestive heart failure.  Appreciate cardiology input.  Currently on carvedilol, hydralazine, isosorbide mononitrate -IV furosemide twice daily. Can transition to oral diuresis.   Hyponatremia: secondary to cardiac failure and kidney disease. Progressively lower due to volume status. Corrected today    LOS: 25   6/24/202412:35 PM

## 2023-04-20 DIAGNOSIS — E1129 Type 2 diabetes mellitus with other diabetic kidney complication: Secondary | ICD-10-CM

## 2023-04-20 DIAGNOSIS — J189 Pneumonia, unspecified organism: Secondary | ICD-10-CM | POA: Diagnosis not present

## 2023-04-20 DIAGNOSIS — I5023 Acute on chronic systolic (congestive) heart failure: Secondary | ICD-10-CM | POA: Diagnosis not present

## 2023-04-20 DIAGNOSIS — R570 Cardiogenic shock: Secondary | ICD-10-CM | POA: Diagnosis not present

## 2023-04-20 DIAGNOSIS — J441 Chronic obstructive pulmonary disease with (acute) exacerbation: Secondary | ICD-10-CM | POA: Diagnosis not present

## 2023-04-20 DIAGNOSIS — Z7189 Other specified counseling: Secondary | ICD-10-CM | POA: Diagnosis not present

## 2023-04-20 LAB — RENAL FUNCTION PANEL
Albumin: 2.7 g/dL — ABNORMAL LOW (ref 3.5–5.0)
Anion gap: 9 (ref 5–15)
BUN: 64 mg/dL — ABNORMAL HIGH (ref 8–23)
CO2: 26 mmol/L (ref 22–32)
Calcium: 8.6 mg/dL — ABNORMAL LOW (ref 8.9–10.3)
Chloride: 100 mmol/L (ref 98–111)
Creatinine, Ser: 2.82 mg/dL — ABNORMAL HIGH (ref 0.44–1.00)
GFR, Estimated: 16 mL/min — ABNORMAL LOW (ref >60)
Glucose, Bld: 120 mg/dL — ABNORMAL HIGH (ref 70–99)
Phosphorus: 4.3 mg/dL (ref 2.5–4.6)
Potassium: 3.7 mmol/L (ref 3.5–5.1)
Sodium: 135 mmol/L (ref 135–145)

## 2023-04-20 LAB — GLUCOSE, CAPILLARY
Glucose-Capillary: 115 mg/dL — ABNORMAL HIGH (ref 70–99)
Glucose-Capillary: 156 mg/dL — ABNORMAL HIGH (ref 70–99)
Glucose-Capillary: 152 mg/dL — ABNORMAL HIGH (ref 70–99)
Glucose-Capillary: 64 mg/dL — ABNORMAL LOW (ref 70–99)
Glucose-Capillary: 113 mg/dL — ABNORMAL HIGH (ref 70–99)

## 2023-04-20 MED ORDER — FUROSEMIDE 80 MG PO TABS: 80.0000 mg | ORAL_TABLET | Freq: Two times a day (BID) | ORAL | 11 refills | Status: DC

## 2023-04-20 MED ORDER — AMIODARONE HCL 200 MG PO TABS: 200.0000 mg | ORAL_TABLET | Freq: Every day | ORAL | 1 refills | Status: DC

## 2023-04-20 MED ORDER — CARVEDILOL 3.125 MG PO TABS: 3.1250 mg | ORAL_TABLET | Freq: Two times a day (BID) | ORAL | 1 refills | Status: DC

## 2023-04-20 MED ORDER — NYSTATIN 100000 UNIT/GM EX POWD: Freq: Three times a day (TID) | CUTANEOUS | 0 refills | Status: DC

## 2023-04-20 MED ORDER — HYDRALAZINE HCL 10 MG PO TABS: 10.0000 mg | ORAL_TABLET | Freq: Three times a day (TID) | ORAL | 1 refills | Status: DC

## 2023-04-20 MED ORDER — REVEFENACIN 175 MCG/3ML IN SOLN: 175.0000 ug | Freq: Every day | RESPIRATORY_TRACT | 1 refills | Status: DC

## 2023-04-20 MED ORDER — SODIUM BICARBONATE 650 MG PO TABS: 650.0000 mg | ORAL_TABLET | Freq: Two times a day (BID) | ORAL | 1 refills | Status: DC

## 2023-04-20 MED ORDER — ALUM & MAG HYDROXIDE-SIMETH 200-200-20 MG/5ML PO SUSP: 30.0000 mL | ORAL | 0 refills | Status: DC | PRN

## 2023-04-20 MED ORDER — OXYCODONE HCL 5 MG PO TABS: 2.5000 mg | ORAL_TABLET | ORAL | 0 refills | Status: DC | PRN

## 2023-04-20 MED ORDER — ISOSORBIDE MONONITRATE ER 30 MG PO TB24: 30.0000 mg | ORAL_TABLET | Freq: Every day | ORAL | 1 refills | Status: DC

## 2023-04-20 MED ORDER — POLYETHYLENE GLYCOL 3350 17 G PO PACK: 17.0000 g | PACK | Freq: Every day | ORAL | 0 refills | Status: DC

## 2023-04-20 MED ORDER — ALPRAZOLAM 0.25 MG PO TABS: 0.2500 mg | ORAL_TABLET | Freq: Three times a day (TID) | ORAL | 0 refills | Status: DC | PRN

## 2023-04-20 MED ORDER — SALINE SPRAY 0.65 % NA SOLN: 1.0000 | NASAL | 0 refills | Status: DC | PRN

## 2023-04-20 NOTE — TOC Transition Note (Signed)
Transition of Care [facility]) - CM/SW Discharge Note   Patient Details  Name: Jill Hudson MRN: [id number] Date of Birth: [birth year]  [facility] CM/SW Contact:  Margarito Liner, LCSW Phone Number: 04/20/2023, 3:13 PM   Clinical Narrative:   Patient has orders to discharge home with hospice today. Daughter confirmed DME has been delivered. EMS transport has been arranged. No further concerns. CSW signing off.  Final next level of care: Home w Hospice Care Barriers to Discharge: Barriers Resolved   Patient Goals and CMS Choice CMS Medicare.gov Compare Post Acute Care list provided to:: Patient Represenative (must comment) (son [name]) Choice offered to / list presented to : Adult Children  Discharge Placement                  Patient to be transferred to facility by: EMS Name of family member notified: Maralyn Sago Gains Patient and family notified of of transfer: 04/20/23  Discharge Plan and Services Additional resources added to the After Visit Summary for                                       Social Determinants of Health (SDOH) Interventions SDOH Screenings   Food Insecurity: No Food Insecurity (03/25/2023)  Housing: Low Risk  (03/25/2023)  Transportation Needs: No Transportation Needs (03/25/2023)  Utilities: Not At Risk (03/25/2023)  Alcohol Screen: Low Risk  (03/11/2022)  Depression (PHQ2-9): High Risk (01/01/2023)  Financial Resource Strain: Low Risk  (01/01/2023)  Physical Activity: Insufficiently Active (03/11/2022)  Social Connections: Socially Integrated (05/12/2022)  Stress: No Stress Concern Present (03/11/2022)  Tobacco Use: Medium Risk (03/25/2023)     Readmission Risk Interventions     No data to display

## 2023-04-20 NOTE — Plan of Care (Signed)
  Problem: Education: Goal: Ability to demonstrate management of disease process will improve Outcome: Progressing Goal: Ability to verbalize understanding of medication therapies will improve Outcome: Progressing Goal: Individualized Educational Video(s) Outcome: Progressing   Problem: Activity: Goal: Capacity to carry out activities will improve Outcome: Progressing   Problem: Cardiac: Goal: Ability to achieve and maintain adequate cardiopulmonary perfusion will improve Outcome: Progressing   Problem: Education: Goal: Knowledge of disease or condition will improve Outcome: Progressing Goal: Knowledge of the prescribed therapeutic regimen will improve Outcome: Progressing Goal: Individualized Educational Video(s) Outcome: Progressing   Problem: Activity: Goal: Ability to tolerate increased activity will improve Outcome: Progressing Goal: Will verbalize the importance of balancing activity with adequate rest periods Outcome: Progressing   Problem: Respiratory: Goal: Ability to maintain a clear airway will improve Outcome: Progressing Goal: Levels of oxygenation will improve Outcome: Progressing Goal: Ability to maintain adequate ventilation will improve Outcome: Progressing   Problem: Activity: Goal: Ability to tolerate increased activity will improve Outcome: Progressing   Problem: Clinical Measurements: Goal: Ability to maintain a body temperature in the normal range will improve Outcome: Progressing   Problem: Respiratory: Goal: Ability to maintain adequate ventilation will improve Outcome: Progressing Goal: Ability to maintain a clear airway will improve Outcome: Progressing   Problem: Education: Goal: Ability to describe self-care measures that may prevent or decrease complications (Diabetes Survival Skills Education) will improve Outcome: Progressing Goal: Individualized Educational Video(s) Outcome: Progressing   Problem: Coping: Goal: Ability to  adjust to condition or change in health will improve Outcome: Progressing   Problem: Fluid Volume: Goal: Ability to maintain a balanced intake and output will improve Outcome: Progressing   Problem: Health Behavior/Discharge Planning: Goal: Ability to identify and utilize available resources and services will improve Outcome: Progressing Goal: Ability to manage health-related needs will improve Outcome: Progressing   Problem: Metabolic: Goal: Ability to maintain appropriate glucose levels will improve Outcome: Progressing   Problem: Nutritional: Goal: Maintenance of adequate nutrition will improve Outcome: Progressing Goal: Progress toward achieving an optimal weight will improve Outcome: Progressing   Problem: Skin Integrity: Goal: Risk for impaired skin integrity will decrease Outcome: Progressing   Problem: Tissue Perfusion: Goal: Adequacy of tissue perfusion will improve Outcome: Progressing   Problem: Education: Goal: Knowledge of General Education information will improve Description: Including pain rating scale, medication(s)/side effects and non-pharmacologic comfort measures Outcome: Progressing   Problem: Health Behavior/Discharge Planning: Goal: Ability to manage health-related needs will improve Outcome: Progressing   Problem: Clinical Measurements: Goal: Ability to maintain clinical measurements within normal limits will improve Outcome: Progressing Goal: Will remain free from infection Outcome: Progressing Goal: Diagnostic test results will improve Outcome: Progressing Goal: Respiratory complications will improve Outcome: Progressing Goal: Cardiovascular complication will be avoided Outcome: Progressing   Problem: Activity: Goal: Risk for activity intolerance will decrease Outcome: Progressing   Problem: Nutrition: Goal: Adequate nutrition will be maintained Outcome: Progressing   Problem: Coping: Goal: Level of anxiety will  decrease Outcome: Progressing   Problem: Elimination: Goal: Will not experience complications related to bowel motility Outcome: Progressing Goal: Will not experience complications related to urinary retention Outcome: Progressing   Problem: Pain Managment: Goal: General experience of comfort will improve Outcome: Progressing   Problem: Safety: Goal: Ability to remain free from injury will improve Outcome: Progressing   Problem: Skin Integrity: Goal: Risk for impaired skin integrity will decrease Outcome: Progressing   

## 2023-04-20 NOTE — Progress Notes (Signed)
Mobility Specialist - Progress Note  During mobility: SpO2 (97) @ 4L Post-mobility: SPO2 (94) @ 4L     04/20/23 1158  Mobility  Activity Ambulated with assistance in room  Level of Assistance Minimal assist, patient does 75% or more  Assistive Device Front wheel walker  Distance Ambulated (ft) 3 ft  Range of Motion/Exercises Active  Activity Response Tolerated well  Mobility Referral Yes  $Mobility charge 1 Mobility  Mobility Specialist Start Time (ACUTE ONLY) 1134  Mobility Specialist Stop Time (ACUTE ONLY) 1158  Mobility Specialist Time Calculation (min) (ACUTE ONLY) 24 min   Pt resting in bed on 4L upon entry. Pt STS and ambulates to recliner MinA with RW. Pt left in recliner with needs in reach and chair alarm activated. Pt endorses SOB post ambulation session. Daughter present at bedside.   Johnathan Hausen Mobility Specialist 04/20/23, 12:02 PM

## 2023-04-20 NOTE — Discharge Summary (Signed)
Physician Discharge Summary   Patient: Jill Hudson MRN: [id number] DOB: 1935/06/10  Admit date:     03/25/2023  Discharge date: 04/20/23  Discharge Physician: Arnetha Courser   PCP: Reubin Milan, MD   Recommendations at discharge:  Follow-up with primary care provider  Discharge Diagnoses: Principal Problem:   Cardiogenic shock (HCC) Active Problems:   Acute on chronic systolic CHF (congestive heart failure) (HCC)   Acute kidney injury superimposed on CKD (HCC)   Paroxysmal atrial fibrillation (HCC)   NSTEMI (non-ST elevated myocardial infarction) (HCC)   COPD exacerbation (HCC)   Type II diabetes mellitus with renal manifestations (HCC)   Essential hypertension   History of CVA (cerebrovascular accident)   Hyponatremia   HLD (hyperlipidemia)   CAD (coronary artery disease)   Iron deficiency anemia   PAD (peripheral artery disease) (HCC)   Acute respiratory failure with hypoxia (HCC)   Hypokalemia   Pleural effusion   Community acquired pneumonia   Hyperkalemia   Acute urinary retention   Hospital Course: 88 year old female with history of COPD, HTN, HLD, DM2, stroke, TIA, GERD, anxiety, PVD, CAD, MI, former tobacco use, CKD stage III, GI bleed, iron deficiency anemia, PAD status post PCI comes to the hospital with shortness of breath and hypoxia. Patient was found to have bilateral pulmonary edema /effusion with elevated BNP.  Patient is admitted for further evaluation.  Cardiology is consulted.  Patient will eventually need right and left heart cath pending improvement in renal functions.   6/5.  Patient feeling a little bit better than when she came in.  On heparin drip, milrinone drip, Lasix drip for cardiogenic shock.  Amiodarone drip added for atrial fibrillation. 6/6.  Now on torsemide instead of Lasix drip.  Creatinine down to 2.16 6/7.  Cardiology decided to discontinue milrinone drip.  Switching off amiodarone drip.  Converting heparin drip over to Eliquis.   Creatinine up to 2.31.  Patient in normal sinus rhythm. 6/8.  Creatinine up to 2.5 and potassium 5.3.  Lokelma ordered.  Potassium supplementation discontinued 6/9.  Creatinine up at 2.63.  Needed in and out catheterization this morning. 6/10.  Creatinine 2.97, transfer out of ICU to progressive care.  Urinating well. 6/11.  Creatinine 3.15 with a GFR 14.  Nephrology not sure if she will tolerate dialysis.  6/24: Stable on 5 L of oxygen, no baseline oxygen use.  Stable renal function, creatinine peaked at 3.8.  Nephrology managing diuretics.  6/25: Remained hemodynamically stable.  Daughter decided to take her back home with hospice help where she is being discharged.  Nephrology would like her to continue furosemide 80 mg twice daily. She will so use 4 to 5 L of oxygen-hospice will arrange.  Patient will continue on current medications and follow-up with her providers for further recommendations.  Assessment and Plan: * Cardiogenic shock [facility]) Cardiology took off milrinone drip 6/7.  Not on any pressors at this point.  Acute on chronic systolic CHF (congestive heart failure) (HCC) Patient switched to PO Lasix 80 mg twice a day, hydralazine, Imdur and low-dose Coreg.  No ACE/ARB or spironolactone with her worsening kidney function.  Paroxysmal atrial fibrillation (HCC) Patient converted to normal sinus rhythm.  Taken off amiodarone drip and switched to oral on 6/7.  Heparin drip converted over to Eliquis on 6/7.  Acute kidney injury superimposed on CKD (HCC) AKI on CKD stage IIIa.  AKI resolved and creatinine is now at baseline.  NSTEMI (non-ST elevated myocardial infarction) (HCC) With creatinine above  2, medical management.  Continue aspirin, Imdur Coreg and low-dose Crestor.  Type II diabetes mellitus with renal manifestations (HCC) Last hemoglobin A1c 7.9.  Hypoglycemic episode the other morning.  Decreased Semglee insulin down to 8 units at night.  Continue sliding scale.  COPD  exacerbation (HCC) Continue nebulizers.  Essential hypertension Continue hydralazine, Imdur and low-dose Coreg.  History of CVA (cerebrovascular accident) Patient on Eliquis for stroke prevention  HLD (hyperlipidemia) On Crestor  Hyponatremia Sodium 131.  Iron deficiency anemia Last hemoglobin 8.2.  Continue to monitor.  Acute urinary retention Patient required in and out catheterization this morning.  Continue to monitor.  Hyperkalemia Previously treated.  Pleural effusion Right thoracentesis removed 700 mL of fluid.  This was done by interventional radiology on 6/3.  Hypokalemia Today with hypokalemia.  Since patient back on IV Lasix will give some potassium supplementation.  Acute respiratory failure with hypoxia (HCC) Patient initially requiring BiPAP secondary to respiratory distress.  Currently on 4 L.   Pain control - [employer] Controlled Substance Reporting System database was reviewed. and patient was instructed, not to drive, operate heavy machinery, perform activities at heights, swimming or participation in water activities or provide baby-sitting services while on Pain, Sleep and Anxiety Medications; until their outpatient Physician has advised to do so again. Also recommended to not to take more than prescribed Pain, Sleep and Anxiety Medications.  Consultants: Cardiology.  PCCM.  Nephrology.  Palliative care Procedures performed: None Disposition: Hospice care Diet recommendation:  Discharge Diet Orders (From admission, onward)     Start     Ordered   04/20/23 0000  Diet - low sodium heart healthy        04/20/23 1351           Cardiac and Carb modified diet DISCHARGE MEDICATION: Allergies as of 04/20/2023       Reactions   Cefdinir Diarrhea   Saxagliptin Diarrhea   Epinephrine Other (See Comments)   Patient does not remember what happens when she uses this   Evolocumab    Pain with injection   Atorvastatin Other (See Comments)   Muscle  aches   Codeine Other (See Comments)   Upset stomach   Ezetimibe Other (See Comments)   Myalgias(ZETIA)   Limonene Rash   Patient does not recall this reaction   Nitrofurantoin Rash, Other (See Comments)   Pruitus   Sulfa Antibiotics Rash, Other (See Comments)   Sore mouth         Medication List     STOP taking these medications    amLODipine 5 MG tablet Commonly known as: NORVASC   cloNIDine 0.1 MG tablet Commonly known as: CATAPRES   losartan 25 MG tablet Commonly known as: COZAAR   promethazine-dextromethorphan 6.25-15 MG/5ML syrup Commonly known as: PROMETHAZINE-DM   torsemide 20 MG tablet Commonly known as: DEMADEX       TAKE these medications    acetaminophen 650 MG CR tablet Commonly known as: TYLENOL Take 1,300 mg by mouth every 8 (eight) hours as needed for pain.   albuterol 108 (90 Base) MCG/ACT inhaler Commonly known as: VENTOLIN HFA Inhale 2 puffs into the lungs every 6 (six) hours as needed for wheezing or shortness of breath.   ALPRAZolam 0.25 MG tablet Commonly known as: XANAX Take 1 tablet (0.25 mg total) by mouth 3 (three) times daily as needed for anxiety or sleep.   alum & mag hydroxide-simeth 200-200-20 MG/5ML suspension Commonly known as: MAALOX/MYLANTA Take 30 mLs by mouth every  4 (four) hours as needed for indigestion.   amiodarone 200 MG tablet Commonly known as: PACERONE Take 1 tablet (200 mg total) by mouth daily. Start taking on: April 21, 2023   apixaban 2.5 MG Tabs tablet Commonly known as: Eliquis TAKE 1 TABLET(2.5 MG) BY MOUTH TWICE DAILY   B-D ULTRAFINE III SHORT PEN 31G X 8 MM Misc Generic drug: Insulin Pen Needle USE AS DIRECTED   carvedilol 3.125 MG tablet Commonly known as: COREG Take 1 tablet (3.125 mg total) by mouth 2 (two) times daily with a meal.   Cyanocobalamin 1000 MCG Tbcr Take 1,000 mcg by mouth daily.   feeding supplement (GLUCERNA SHAKE) Liqd Take 237 mLs by mouth 3 (three) times daily between  meals.   ferrous sulfate 325 (65 FE) MG tablet Take 325 mg by mouth daily with breakfast.   furosemide 80 MG tablet Commonly known as: Lasix Take 1 tablet (80 mg total) by mouth 2 (two) times daily.   gabapentin 100 MG capsule Commonly known as: NEURONTIN TAKE 1 CAPSULE(100 MG) BY MOUTH AT BEDTIME   hydrALAZINE 10 MG tablet Commonly known as: APRESOLINE Take 1 tablet (10 mg total) by mouth every 8 (eight) hours.   insulin glargine 100 UNIT/ML injection Commonly known as: LANTUS Inject 0.05 mLs (5 Units total) into the skin daily.   Insulin Lispro w/ Trans Port 100 UNIT/ML Sopn 3 Units daily. ( Humalog )   ipratropium-albuterol 0.5-2.5 (3) MG/3ML Soln Commonly known as: DUONEB Take 3 mLs by nebulization every 6 (six) hours as needed.   isosorbide mononitrate 30 MG 24 hr tablet Commonly known as: IMDUR Take 1 tablet (30 mg total) by mouth daily. Start taking on: April 21, 2023   melatonin 5 MG Tabs Take 0.5 tablets (2.5 mg total) by mouth at bedtime as needed (sleep).   nystatin powder Commonly known as: MYCOSTATIN/NYSTOP Apply topically 3 (three) times daily.   oxyCODONE 5 MG immediate release tablet Commonly known as: Oxy IR/ROXICODONE Take 0.5-1 tablets (2.5-5 mg total) by mouth every 3 (three) hours as needed for moderate pain or severe pain (2.5mg  for moderate pain or SOB, 5mg  for severe pain or for shortness of breath.).   polyethylene glycol 17 g packet Commonly known as: MIRALAX / GLYCOLAX Take 17 g by mouth daily. Start taking on: April 21, 2023   PRESERVISION AREDS PO Take 1 capsule by mouth 2 (two) times daily.   revefenacin 175 MCG/3ML nebulizer solution Commonly known as: YUPELRI Take 3 mLs (175 mcg total) by nebulization daily. Start taking on: April 21, 2023   rosuvastatin 10 MG tablet Commonly known as: CRESTOR Take 1 tablet (10 mg total) by mouth 4 (four) times a week. Monday, Wed, Friday and Saturday   sodium bicarbonate 650 MG tablet Take 1  tablet (650 mg total) by mouth 2 (two) times daily.   sodium chloride 0.65 % Soln nasal spray Commonly known as: OCEAN Place 1 spray into both nostrils as needed for congestion.   Trelegy Ellipta 100-62.5-25 MCG/ACT Aepb Generic drug: Fluticasone-Umeclidin-Vilant Inhale 1 puff into the lungs daily.        Contact information for follow-up providers     Sondra Come, MD. Schedule an appointment as soon as possible for a visit in 1 week(s).   Specialty: Urology Why: for Bladder emptying check h/o intermittent urinary retention Contact information: [address] [place] [address] [phone]         Reubin Milan, MD. Schedule an appointment as soon  as possible for a visit in 1 week(s).   Specialty: Internal Medicine Contact information: [address] [place] [address] [phone]         Bensimhon, Bevelyn Buckles, MD. Schedule an appointment as soon as possible for a visit in 1 week(s).   Specialty: Cardiology Why: CHF f/u Contact information: [address] [place] [address] [phone]         Mosetta Pigeon, MD. Schedule an appointment as soon as possible for a visit in 1 week(s).   Specialty: Nephrology Why: CKD-IV Contact information: [address] [place] [address] [phone]              Contact information for after-discharge care     Destination     [facility]erred [facility] .   Service: Skilled Nursing Contact information: [address] [place] [address] [phone]                    Discharge Exam: Ceasar Mons Weights   04/16/23 0840 04/17/23 0500 04/19/23 0500  Weight: 60.6 kg 62.5 kg 63 kg   General.  Frail elderly lady, in no acute distress. Pulmonary.  Lungs clear bilaterally, normal respiratory effort. CV.  Regular rate and rhythm, no JVD, rub or  murmur. Abdomen.  Soft, nontender, nondistended, BS positive. CNS.  Alert and oriented .  No focal neurologic deficit. Extremities.  No edema, no cyanosis, pulses intact and symmetrical.  Condition at discharge: stable  The results of significant diagnostics from this hospitalization (including imaging, microbiology, ancillary and laboratory) are listed below for reference.   Imaging Studies: DG Chest Port 1 View  Result Date: 04/17/2023 CLINICAL DATA:  Shortness of breath EXAM: PORTABLE CHEST 1 VIEW COMPARISON:  04/02/2023 FINDINGS: Single frontal view of the chest demonstrates stable enlargement of the cardiac silhouette. There is increased central vascular congestion, with progressive bilateral veiling opacities consistent with effusions and underlying consolidation. No pneumothorax. No acute bony abnormalities. IMPRESSION: 1. Worsening volume status, with progressive vascular congestion, increased airspace disease, and enlarging bilateral pleural effusions. Electronically Signed   By: Sharlet Salina M.D.   On: 04/17/2023 23:31   US RENAL  Result Date: 04/12/2023 CLINICAL DATA:  Urinary retention EXAM: RENAL / URINARY TRACT ULTRASOUND COMPLETE COMPARISON:  Renal ultrasound 04/01/2023 FINDINGS: Right Kidney: Renal measurements: 9.4 x 4.0 x 4.1 cm = volume: 80.4 mL. No collecting system dilatation or perinephric fluid. Likely small parapelvic renal cyst measuring 14 mm, similar to the previous when adjusting for technique. Left Kidney: Renal measurements: 10.7 x 4.4 x 4.2 cm = volume: 103.3 mL. No collecting system dilatation. Simple upper pole cyst measuring 2.8 cm. Previously 2.4 cm. Not significantly changed when adjusting for differences in technique. Bladder: Appears normal for degree of bladder distention. Other: Ureteral jets are seen. Note is made of a pleural effusion on the right. Bladder volume 357 cc. IMPRESSION: No collecting system dilatation.  Bilateral renal cysts. Right-sided  pleural effusion Electronically Signed   By: Karen Kays M.D.   On: 04/12/2023 18:37   DG Chest Port 1 View  Result Date: 04/02/2023 CLINICAL DATA:  Congestive heart failure. EXAM: PORTABLE CHEST 1 VIEW COMPARISON:  04/01/2023 FINDINGS: Stable heart size. Stable positioning of left jugular central line with the catheter tip at the SVC/RA junction. Congestive heart failure slightly increased bilaterally. Increase in bilateral pleural effusions with likely slightly greater volume on the  left compared to the right. No pneumothorax. IMPRESSION: Increase in congestive heart failure and bilateral pleural effusions. Electronically Signed   By: Irish Lack M.D.   On: 04/02/2023 17:21   ECHOCARDIOGRAM LIMITED  Result Date: 04/02/2023    ECHOCARDIOGRAM LIMITED REPORT   Patient Name:   KAIDANCE PANTOJA Date of Exam: 04/02/2023 Medical Rec #:  [id number]     Height:       60.0 in Accession #:    [id number]    Weight:       131.4 lb Date of Birth:  December 17, 1934      BSA:          1.561 m Patient Age:    88 years      BP:           118/57 mmHg Patient Gender: F             HR:           70 bpm. Exam Location:  [facility] Procedure: Limited Echo, Cardiac Doppler and Color Doppler Indications:     Mitral valve disorder  History:         Patient has prior history of Echocardiogram examinations, most                  recent 03/26/2023. CHF, CAD and Acute MI, PAD, COPD and Stroke;                  Risk Factors:Hypertension, Diabetes and Dyslipidemia. CKD.  Sonographer:     Mikki Harbor Referring Phys:  [id number] [name] Diagnosing Phys: Julien Nordmann MD IMPRESSIONS  1. Left ventricular ejection fraction, by estimation, is 35 to 40%. The left ventricle has moderately decreased function. The left ventricle demonstrates global hypokinesis. Left ventricular diastolic parameters are indeterminate.  2. Right ventricular systolic function is normal. The right ventricular size is normal. Tricuspid regurgitation signal is inadequate for  assessing PA pressure.  3. Left atrial size was moderately dilated.  4. Moderate pleural effusion in the left lateral region.  5. The mitral valve is normal in structure. Moderate to severe mitral valve regurgitation. No evidence of mitral stenosis.  6. The aortic valve is normal in structure. Aortic valve regurgitation is not visualized. No aortic stenosis is present.  7. The inferior vena cava is normal in size with <50% respiratory variability, suggesting right atrial pressure of 8 mmHg. FINDINGS  Left Ventricle: Left ventricular ejection fraction, by estimation, is 35 to 40%. The left ventricle has moderately decreased function. The left ventricle demonstrates global hypokinesis. The left ventricular internal cavity size was normal in size. There is no left ventricular hypertrophy. Left ventricular diastolic parameters are indeterminate. Right Ventricle: The right ventricular size is normal. No increase in right ventricular wall thickness. Right ventricular systolic function is normal. Tricuspid regurgitation signal is inadequate for assessing PA pressure. Left Atrium: Left atrial size was moderately dilated. Right Atrium: Right atrial size was normal in size. Pericardium: There is no evidence of pericardial effusion. Mitral Valve: The mitral valve is normal in structure. Moderate to severe mitral valve regurgitation, with eccentric posteriorly directed jet. No evidence of mitral valve stenosis. MV peak gradient, 14.3 mmHg. The mean mitral valve gradient is 4.0 mmHg. Tricuspid Valve: The tricuspid valve is normal in structure. Tricuspid valve regurgitation is not demonstrated. No evidence of tricuspid stenosis. Aortic Valve: The aortic valve is normal in structure. Aortic valve regurgitation is not visualized. No aortic stenosis is present. Pulmonic Valve: The pulmonic  valve was normal in structure. Pulmonic valve regurgitation is not visualized. No evidence of pulmonic stenosis. Aorta: The aortic root is normal  in size and structure. Venous: The inferior vena cava is normal in size with less than 50% respiratory variability, suggesting right atrial pressure of 8 mmHg. IAS/Shunts: No atrial level shunt detected by color flow Doppler. Additional Comments: There is a moderate pleural effusion in the left lateral region.  LEFT VENTRICLE PLAX 2D LVIDd:         5.10 cm   Diastology LVIDs:         3.50 cm   LV e' medial:    7.18 cm/s LV PW:         0.90 cm   LV E/e' medial:  21.3 LV IVS:        1.00 cm   LV e' lateral:   8.92 cm/s LVOT diam:     1.90 cm   LV E/e' lateral: 17.2 LVOT Area:     2.84 cm  LEFT ATRIUM             Index LA diam:        4.60 cm 2.95 cm/m LA Vol (A2C):   73.8 ml 47.27 ml/m LA Vol (A4C):   55.5 ml 35.55 ml/m LA Biplane Vol: 67.4 ml 43.17 ml/m   AORTA Ao Root diam: 2.80 cm MITRAL VALVE MV Area (PHT): 3.60 cm       SHUNTS MV Peak grad:  14.3 mmHg      Systemic Diam: 1.90 cm MV Mean grad:  4.0 mmHg MV Vmax:       1.89 m/s MV Vmean:      83.9 cm/s MV Decel Time: 211 msec MR Peak grad:    104.0 mmHg MR Mean grad:    66.0 mmHg MR Vmax:         510.00 cm/s MR Vmean:        376.5 cm/s MR PISA:         1.79 cm MR PISA Eff ROA: 32 mm MR PISA Radius:  0.53 cm MV E velocity: 153.00 cm/s MV A velocity: 88.00 cm/s MV E/A ratio:  1.74 Julien Nordmann MD Electronically signed by Julien Nordmann MD Signature Date/Time: 04/02/2023/4:30:20 PM    Final    US RENAL  Result Date: 04/01/2023 CLINICAL DATA:  Acute kidney failure EXAM: RENAL / URINARY TRACT ULTRASOUND COMPLETE COMPARISON:  02/21/2020 FINDINGS: Right Kidney: Renal measurements: 10.4 x 4.6 x 4.5 cm = volume: 111.4 mL. No collecting system dilatation or perinephric fluid. Right-sided simple appearing renal cysts identified measuring up to 11 mm centrally. Not as well seen on the prior renal ultrasound. Left Kidney: Renal measurements: 10.1 x 4.6 x 4.3 cm = volume: 102.6 mL. No collecting system dilatation or perinephric fluid. Left-sided renal cysts are  identified which appears simple. Largest towards the upper pole measures 2.4 cm. Previously this measured 3.3 cm. Bladder: Foley catheter in place.  Underdistended bladder. Other: Pleural effusion incidentally seen on the right. IMPRESSION: No collecting system dilatation. Bilateral renal cysts. Right-sided pleural effusion. Electronically Signed   By: Karen Kays M.D.   On: 04/01/2023 17:39   DG Chest Port 1 View  Result Date: 04/01/2023 CLINICAL DATA:  Congestive heart failure. EXAM: PORTABLE CHEST 1 VIEW COMPARISON:  Radiograph 03/29/2023, CT 03/28/2023 FINDINGS: Left central line is unchanged in position. Cardiomegaly is stable. Unchanged mediastinal contours with aortic atherosclerosis. Stable left pleural effusion and basilar opacity. Increasing right pleural effusion. Fluid in the  right minor fissure. Mild vascular congestion. No pneumothorax. IMPRESSION: 1. Increasing right pleural effusion. 2. Stable left pleural effusion and basilar opacity. 3. Stable cardiomegaly.  Mild vascular congestion. Electronically Signed   By: Narda Rutherford M.D.   On: 04/01/2023 09:02   DG Chest Port 1 View  Result Date: 03/29/2023 CLINICAL DATA:  Line placement EXAM: PORTABLE CHEST 1 VIEW COMPARISON:  X-ray 03/29/2023 FINDINGS: Calcified aorta. Stable cardiopericardial silhouette. New left IJ catheter with tip at the SVC right atrial junction region. Small pleural effusions are seen, left-greater-than-right with the adjacent opacities. No pneumothorax or edema. Overlapping cardiac leads. Osteopenia. IMPRESSION: Left IJ catheter in place.  No pneumothorax. Electronically Signed   By: Karen Kays M.D.   On: 03/29/2023 16:19   US THORACENTESIS ASP PLEURAL SPACE W/IMG GUIDE  Result Date: 03/29/2023 INDICATION: Bilateral pleural effusions. Request received for diagnostic and therapeutic thoracentesis EXAM: ULTRASOUND GUIDED RIGHT THORACENTESIS MEDICATIONS: 4 cc 1% lidocaine COMPLICATIONS: None immediate. PROCEDURE: An  ultrasound guided thoracentesis was thoroughly discussed with the patient and questions answered. The benefits, risks, alternatives and complications were also discussed. The patient understands and wishes to proceed with the procedure. Written consent was obtained. Ultrasound was performed to localize and mark an adequate pocket of fluid in the right chest. The area was then prepped and draped in the normal sterile fashion. 1% Lidocaine was used for local anesthesia. Under ultrasound guidance a 6 Fr Safe-T-Centesis catheter was introduced. Thoracentesis was performed. The catheter was removed and a dressing applied. FINDINGS: A total of approximately 700 mL of pale yellow fluid was removed. Samples were sent to the laboratory as requested by the clinical team. IMPRESSION: Successful ultrasound guided right thoracentesis yielding 700 mL of pleural fluid. Follow-up chest x-ray revealed no evidence of pneumothorax. Procedure performed by Mina Marble, PA-C Electronically Signed   By: Malachy Moan M.D.   On: 03/29/2023 13:25   DG Chest Port 1 View  Result Date: 03/29/2023 CLINICAL DATA:  142230 Pleural effusion 142230 EXAM: PORTABLE CHEST - 1 VIEW COMPARISON:  03/25/2023 FINDINGS: New moderate left pleural effusion with adjacent opacities in the left lower lung. Right lung remains clear. Heart size upper limits normal. Aortic Atherosclerosis (ICD10-170.0). No pneumothorax. Visualized bones unremarkable. IMPRESSION: New moderate left pleural effusion. Electronically Signed   By: Corlis Leak M.D.   On: 03/29/2023 10:48   CT CHEST WO CONTRAST  Result Date: 03/28/2023 CLINICAL DATA:  88 year old female with shortness of breath. EXAM: CT CHEST WITHOUT CONTRAST TECHNIQUE: Multidetector CT imaging of the chest was performed following the standard protocol without IV contrast. RADIATION DOSE REDUCTION: This exam was performed according to the departmental dose-optimization program which includes automated exposure  control, adjustment of the mA and/or kV according to patient size and/or use of iterative reconstruction technique. COMPARISON:  Portable chest 03/25/2023 and earlier. FINDINGS: Cardiovascular: Extensive Calcified aortic atherosclerosis. Heavily calcified aberrant origin of the right subclavian artery. Vascular patency is not evaluated in the absence of IV contrast. Mild cardiomegaly. No pericardial effusion. Calcified coronary artery plaque. Mediastinum/Nodes: Small reactive appearing mediastinal lymph nodes. No mediastinal mass. Lungs/Pleura: Moderate bilateral layering pleural effusions with simple fluid density suggesting transudate. Compressive lower lobe atelectasis. Superimposed moderate to severe centrilobular emphysema, most pronounced in the upper lobes. Major airways remain patent. No air bronchograms. Mild emphysema related architectural distortion including in the lingula. Upper Abdomen: Negative visible noncontrast spleen, pancreas, adrenal glands, bowel. Occasional circumscribed and simple fluid density areas in the partially visible liver appear to be benign patent  cysts (no follow-up imaging recommended). Similar exophytic left renal upper pole cyst suspected (no follow-up imaging recommended). Renal vascular calcifications. Musculoskeletal: Severe chronic thoracolumbar junction disc and endplate degeneration in the setting of levoconvex scoliosis there. Degenerative vertebral body sclerosis. Vacuum disc. No acute or suspicious osseous lesion identified. IMPRESSION: 1. Moderate layering bilateral pleural effusions and lower lobe atelectasis superimposed on fairly advanced Emphysema (ICD10-J43.9). 2. Advanced  Aortic Atherosclerosis (ICD10-I70.0). 3. Spinal degeneration. Electronically Signed   By: Odessa Fleming M.D.   On: 03/28/2023 10:39   ECHOCARDIOGRAM COMPLETE  Result Date: 03/26/2023    ECHOCARDIOGRAM REPORT   Patient Name:   KANITA DELAGE Date of Exam: 03/26/2023 Medical Rec #:  [id number]      Height:       60.0 in Accession #:    [id number]    Weight:       124.0 lb Date of Birth:  05-Sep-1935      BSA:          1.523 m Patient Age:    88 years      BP:           122/52 mmHg Patient Gender: F             HR:           108 bpm. Exam Location:  [facility] Procedure: 2D Echo, Cardiac Doppler and Color Doppler Indications:     CHF  History:         Patient has prior history of Echocardiogram examinations, most                  recent 02/19/2020. CHF, CAD, PAD, COPD and Stroke,                  Arrythmias:Atrial Fibrillation; Risk Factors:Hypertension,                  Diabetes and Dyslipidemia.  Sonographer:     Mikki Harbor Referring Phys:  [id number] Brien Few NIU Diagnosing Phys: Lorine Bears MD IMPRESSIONS  1. Left ventricular ejection fraction, by estimation, is 30 to 35%. The left ventricle has moderately decreased function. Left ventricular endocardial border not optimally defined to evaluate regional wall motion. The left ventricular internal cavity size was mildly dilated. Left ventricular diastolic parameters are consistent with Grade II diastolic dysfunction (pseudonormalization).  2. Right ventricular systolic function is normal. The right ventricular size is normal. There is mildly elevated pulmonary artery systolic pressure.  3. Left atrial size was mildly dilated.  4. The mitral valve is degenerative. Severe mitral valve regurgitation. No evidence of mitral stenosis.  5. The aortic valve is normal in structure. Aortic valve regurgitation is not visualized. Aortic valve sclerosis/calcification is present, without any evidence of aortic stenosis.  6. The inferior vena cava is normal in size with <50% respiratory variability, suggesting right atrial pressure of 8 mmHg. FINDINGS  Left Ventricle: Left ventricular ejection fraction, by estimation, is 30 to 35%. The left ventricle has moderately decreased function. Left ventricular endocardial border not optimally defined to evaluate regional wall motion. The  left ventricular internal cavity size was mildly dilated. There is no left ventricular hypertrophy. Left ventricular diastolic parameters are consistent with Grade II diastolic dysfunction (pseudonormalization). Right Ventricle: The right ventricular size is normal. No increase in right ventricular wall thickness. Right ventricular systolic function is normal. There is mildly elevated pulmonary artery systolic pressure. The tricuspid regurgitant velocity is 2.77  m/s, and with an assumed right atrial pressure of  8 mmHg, the estimated right ventricular systolic pressure is 38.7 mmHg. Left Atrium: Left atrial size was mildly dilated. Right Atrium: Right atrial size was normal in size. Pericardium: There is no evidence of pericardial effusion. Mitral Valve: The mitral valve is degenerative in appearance. There is mild thickening of the mitral valve leaflet(s). There is mild calcification of the mitral valve leaflet(s). Severe mitral valve regurgitation, with posteriorly-directed jet. No evidence of mitral valve stenosis. MV peak gradient, 10.8 mmHg. The mean mitral valve gradient is 4.0 mmHg. Tricuspid Valve: The tricuspid valve is normal in structure. Tricuspid valve regurgitation is mild . No evidence of tricuspid stenosis. Aortic Valve: The aortic valve is normal in structure. Aortic valve regurgitation is not visualized. Aortic valve sclerosis/calcification is present, without any evidence of aortic stenosis. Aortic valve mean gradient measures 2.0 mmHg. Aortic valve peak  gradient measures 4.1 mmHg. Aortic valve area, by VTI measures 2.36 cm. Pulmonic Valve: The pulmonic valve was normal in structure. Pulmonic valve regurgitation is not visualized. No evidence of pulmonic stenosis. Aorta: The aortic root is normal in size and structure. Venous: The inferior vena cava is normal in size with less than 50% respiratory variability, suggesting right atrial pressure of 8 mmHg. IAS/Shunts: No atrial level shunt detected  by color flow Doppler.  LEFT VENTRICLE PLAX 2D LVIDd:         5.20 cm   Diastology LVIDs:         4.00 cm   LV e' medial:    6.20 cm/s LV PW:         0.90 cm   LV E/e' medial:  24.5 LV IVS:        1.00 cm   LV e' lateral:   12.50 cm/s LVOT diam:     1.90 cm   LV E/e' lateral: 12.2 LV SV:         45 LV SV Index:   29 LVOT Area:     2.84 cm  LEFT ATRIUM             Index        RIGHT ATRIUM           Index LA diam:        4.10 cm 2.69 cm/m   RA Area:     14.30 cm LA Vol (A2C):   60.8 ml 39.91 ml/m  RA Volume:   37.80 ml  24.81 ml/m LA Vol (A4C):   47.8 ml 31.38 ml/m LA Biplane Vol: 56.6 ml 37.15 ml/m  AORTIC VALVE                    PULMONIC VALVE AV Area (Vmax):    2.44 cm     PV Vmax:       1.17 m/s AV Area (Vmean):   2.33 cm     PV Peak grad:  5.5 mmHg AV Area (VTI):     2.36 cm AV Vmax:           101.00 cm/s AV Vmean:          63.900 cm/s AV VTI:            0.190 m AV Peak Grad:      4.1 mmHg AV Mean Grad:      2.0 mmHg LVOT Vmax:         87.00 cm/s LVOT Vmean:        52.400 cm/s LVOT VTI:  0.158 m LVOT/AV VTI ratio: 0.83  AORTA Ao Root diam: 2.70 cm MITRAL VALVE                  TRICUSPID VALVE MV Area (PHT): 4.24 cm       TR Peak grad:   30.7 mmHg MV Area VTI:   1.38 cm       TR Vmax:        277.00 cm/s MV Peak grad:  10.8 mmHg MV Mean grad:  4.0 mmHg       SHUNTS MV Vmax:       1.64 m/s       Systemic VTI:  0.16 m MV Vmean:      93.4 cm/s      Systemic Diam: 1.90 cm MV Decel Time: 179 msec MR Peak grad:    79.6 mmHg MR Mean grad:    50.0 mmHg MR Vmax:         446.00 cm/s MR Vmean:        328.0 cm/s MR PISA:         2.26 cm MR PISA Eff ROA: 47 mm MR PISA Radius:  0.60 cm MV E velocity: 152.00 cm/s MV A velocity: 134.00 cm/s MV E/A ratio:  1.13 Lorine Bears MD Electronically signed by Lorine Bears MD Signature Date/Time: 03/26/2023/6:17:49 PM    Final    DG Chest Portable 1 View  Result Date: 03/25/2023 CLINICAL DATA:  Respiratory distress. EXAM: PORTABLE CHEST 1 VIEW COMPARISON:   Two-view chest x-ray 05/01/2021 FINDINGS: The heart size is normal. Atherosclerotic calcifications are present at the aortic arch. Asymmetric interstitial and airspace opacities are present at the right base. Right pleural effusion is present. The smaller left pleural effusion is also suspected. Mild pulmonary vascular congestion is present without frank edema in the left lung. Asymmetric degenerative changes are present in the right shoulder. IMPRESSION: 1. Asymmetric interstitial and airspace opacities at the right base with bilateral pleural effusions. Findings are concerning for infection. 2. Mild pulmonary vascular congestion without frank edema. Electronically Signed   By: Marin Roberts M.D.   On: 03/25/2023 10:16    Microbiology: Results for orders placed or performed during the hospital encounter of 03/25/23  Blood Culture (routine x 2)     Status: None   Collection Time: 03/25/23 12:09 PM   Specimen: BLOOD  Result Value Ref Range Status   Specimen Description BLOOD LEFT [facility]  Final   Special Requests   Final    BOTTLES DRAWN AEROBIC AND ANAEROBIC Blood Culture adequate volume   Culture   Final    NO GROWTH 5 DAYS Performed at [facility], [address] [place], [place] [address]    Report Status 03/30/2023 FINAL  Final  Blood Culture (routine x 2)     Status: None   Collection Time: 03/25/23 12:10 PM   Specimen: BLOOD  Result Value Ref Range Status   Specimen Description BLOOD LEFT FA  Final   Special Requests   Final    BOTTLES DRAWN AEROBIC AND ANAEROBIC Blood Culture adequate volume   Culture   Final    NO GROWTH 5 DAYS Performed at [facility], [address] [place], [place] [address]    Report Status 03/30/2023 FINAL  Final  SARS Coronavirus 2 by RT PCR (hospital order, performed in [facility] hospital lab) *cepheid single result test* Anterior Nasal Swab     Status: None   Collection Time: 03/25/23  1:18 PM  Specimen: Anterior Nasal Swab   Result Value Ref Range Status   SARS Coronavirus 2 by RT PCR NEGATIVE NEGATIVE Final    Comment: (NOTE) SARS-CoV-2 target nucleic acids are NOT DETECTED.  The SARS-CoV-2 RNA is generally detectable in upper and lower respiratory specimens during the acute phase of infection. The lowest concentration of SARS-CoV-2 viral copies this assay can detect is 250 copies / mL. A negative result does not preclude SARS-CoV-2 infection and should not be used as the sole basis for treatment or other patient management decisions.  A negative result may occur with improper specimen collection / handling, submission of specimen other than nasopharyngeal swab, presence of viral mutation(s) within the areas targeted by this assay, and inadequate number of viral copies (<250 copies / mL). A negative result must be combined with clinical observations, patient history, and epidemiological information.  Fact Sheet for Patients:   RoadLapTop.co.za  Fact Sheet for Healthcare Providers: http://kim-miller.com/  This test is not yet approved or  cleared by the [place] FDA and has been authorized for detection and/or diagnosis of SARS-CoV-2 by FDA under an Emergency Use Authorization (EUA).  This EUA will remain in effect (meaning this test can be used) for the duration of the COVID-19 declaration under Section 564(b)(1) of the Act, 21 U.S.C. section 360bbb-3(b)(1), unless the authorization is terminated or revoked sooner.  Performed at [facility], [address] [place], [place] [address]   MRSA Next Gen by PCR, Nasal     Status: None   Collection Time: 03/27/23  4:40 AM   Specimen: Nasal Mucosa; Nasal Swab  Result Value Ref Range Status   MRSA by PCR Next Gen NOT DETECTED NOT DETECTED Final    Comment: (NOTE) The GeneXpert MRSA Assay (FDA approved for NASAL specimens only), is one component of a comprehensive MRSA colonization  surveillance program. It is not intended to diagnose MRSA infection nor to guide or monitor treatment for MRSA infections. Test performance is not FDA approved in patients less than 80 years old. Performed at [facility], [address] [place], [place] [address]   Respiratory (~20 pathogens) panel by PCR     Status: None   Collection Time: 03/28/23  4:00 PM   Specimen: Nasopharyngeal Swab; Respiratory  Result Value Ref Range Status   Adenovirus NOT DETECTED NOT DETECTED Final   Coronavirus 229E NOT DETECTED NOT DETECTED Final    Comment: (NOTE) The Coronavirus on the Respiratory Panel, DOES NOT test for the novel  Coronavirus (2019 nCoV)    Coronavirus HKU1 NOT DETECTED NOT DETECTED Final   Coronavirus NL63 NOT DETECTED NOT DETECTED Final   Coronavirus OC43 NOT DETECTED NOT DETECTED Final   Metapneumovirus NOT DETECTED NOT DETECTED Final   Rhinovirus / Enterovirus NOT DETECTED NOT DETECTED Final   Influenza A NOT DETECTED NOT DETECTED Final   Influenza B NOT DETECTED NOT DETECTED Final   Parainfluenza Virus 1 NOT DETECTED NOT DETECTED Final   Parainfluenza Virus 2 NOT DETECTED NOT DETECTED Final   Parainfluenza Virus 3 NOT DETECTED NOT DETECTED Final   Parainfluenza Virus 4 NOT DETECTED NOT DETECTED Final   Respiratory Syncytial Virus NOT DETECTED NOT DETECTED Final   Bordetella pertussis NOT DETECTED NOT DETECTED Final   Bordetella Parapertussis NOT DETECTED NOT DETECTED Final   Chlamydophila pneumoniae NOT DETECTED NOT DETECTED Final   Mycoplasma pneumoniae NOT DETECTED NOT DETECTED Final    Comment: Performed at [facility], [address], [place], [place] [address]  Body fluid culture w Gram Stain     Status: None   Collection Time: 03/29/23 10:27 AM   Specimen: PATH Cytology Pleural fluid  Result Value Ref Range Status   Specimen Description   Final    PLEURAL Performed at [facility], [address] [place], [place] [address]    Special  Requests   Final    NONE Performed at [facility], [address] [place], [place] [address]    Gram Stain NO WBC SEEN NO ORGANISMS SEEN   Final   Culture   Final    NO GROWTH 3 DAYS Performed at [facility], [address], [place], [place] [address]    Report Status 04/01/2023 FINAL  Final  MRSA Next Gen by PCR, Nasal     Status: None   Collection Time: 03/29/23  3:00 PM   Specimen: Nasal Mucosa; Nasal Swab  Result Value Ref Range Status   MRSA by PCR Next Gen NOT DETECTED NOT DETECTED Final    Comment: (NOTE) The GeneXpert MRSA Assay (FDA approved for NASAL specimens only), is one component of a comprehensive MRSA colonization surveillance program. It is not intended to diagnose MRSA infection nor to guide or monitor treatment for MRSA infections. Test performance is not FDA approved in patients less than 31 years old. Performed at [facility], [address] [place], [place] [address]   Group A Strep by PCR     Status: None   Collection Time: 03/30/23  6:00 PM   Specimen: Throat; Sterile Swab  Result Value Ref Range Status   Group A Strep by PCR NOT DETECTED NOT DETECTED Final    Comment: Performed at [facility], [address] [place], [place] [address]    Labs: CBC: Recent Labs  Lab 04/14/23 0801 04/18/23 0021 04/19/23 0516  WBC 8.9 14.0* 8.5  HGB 8.8* 9.8* 8.2*  HCT 28.4* 31.5* 26.9*  MCV 89.0 89.5 89.7  PLT 229 322 231   Basic Metabolic Panel: Recent Labs  Lab 04/15/23 0832 04/16/23 0933 04/17/23 1055 04/18/23 0021 04/19/23 0516 04/20/23 0503  NA 134* 133*  --  127* 136 135  K 4.8 4.6  --  4.9 4.1 3.7  CL 103 101  --  96* 101 100  CO2 22 23  --  21* 27 26  GLUCOSE 143* 122*  --  236* 107* 120*  BUN 63* 65*  --  64* 66* 64*  CREATININE 3.00* 3.07*  --  2.89* 2.94* 2.82*  CALCIUM 8.2* 8.3*  --  8.0* 8.4* 8.6*  MG  --   --  2.4 2.3  --   --   PHOS 4.4  --   --   --   --  4.3   Liver Function  Tests: Recent Labs  Lab 04/15/23 0832 04/19/23 0516 04/20/23 0503  AST  --  24  --   ALT  --  32  --   ALKPHOS  --  80  --   BILITOT  --  0.6  --   PROT  --  5.0*  --   ALBUMIN 2.7* 2.6* 2.7*   CBG: Recent Labs  Lab 04/19/23 1102 04/19/23 1521 04/19/23 2012 04/19/23 2346 04/20/23 0747  GLUCAP 174* 173* 180* 149* 115*    Discharge time spent: greater than 30 minutes.  This record has been created using Conservation officer, historic buildings. Errors have been sought and corrected,but may not always be located. Such creation  errors do not reflect on the standard of care.   Signed: Arnetha Courser, MD [facility] 04/20/2023

## 2023-04-20 NOTE — Progress Notes (Addendum)
Daily Progress Note   Patient Name: Jill Hudson       Date: 04/20/2023 DOB: Dec 21, 1934  Age: 88 y.o. MRN#: [id number] Attending Physician: Arnetha Courser, MD Primary Care Physician: Reubin Milan, MD Admit Date: 03/25/2023  Reason for Consultation/Follow-up: Establishing goals of care  Subjective: Patient is resting in bed with daughter at bedside.  Patient denies complaint at this time.  They confirm plans for discharge home with hospice.  Daughter states she has spoken with hospice liaison and they have equipment to be delivered today.   Oxycodone in place for pain or shortness of breath as patient has renal insufficiency bordering renal failure.  Xanax in place 3 times daily as needed for anxiety or sleep.  Length of Stay: 26  Current Medications: Scheduled Meds:   amiodarone  200 mg Oral Daily   apixaban  2.5 mg Oral BID   arformoterol  15 mcg Nebulization BID   aspirin EC  81 mg Oral Daily   carvedilol  3.125 mg Oral BID WC   Chlorhexidine Gluconate Cloth  6 each Topical Daily   Chlorhexidine Gluconate Cloth  6 each Topical Q0600   cyanocobalamin  1,000 mcg Oral Daily   feeding supplement (NEPRO CARB STEADY)  237 mL Oral BID BM   gabapentin  100 mg Oral QHS   hydrALAZINE  10 mg Oral Q8H   insulin aspart  0-15 Units Subcutaneous TID WC   insulin aspart  0-5 Units Subcutaneous QHS   insulin aspart  2 Units Subcutaneous TID WC   insulin glargine-yfgn  8 Units Subcutaneous QHS   isosorbide mononitrate  30 mg Oral Daily   multivitamin  1 tablet Oral BID   nystatin   Topical TID   polyethylene glycol  17 g Oral Daily   promethazine  6.25 mg Oral QHS   revefenacin  175 mcg Nebulization Daily   rosuvastatin  10 mg Oral Once per day on Mon Wed Fri Sat   sodium bicarbonate  650  mg Oral BID    Continuous Infusions:  sodium chloride Stopped (04/14/23 1008)   anticoagulant sodium citrate      PRN Meds: acetaminophen, ALPRAZolam, alteplase, alum & mag hydroxide-simeth, anticoagulant sodium citrate, dextromethorphan-guaiFENesin, diphenhydrAMINE, heparin, hydrALAZINE, levalbuterol, lidocaine (PF), lidocaine-prilocaine, melatonin, midazolam, ondansetron (ZOFRAN) IV, ondansetron (ZOFRAN) IV, mouth rinse, oxyCODONE, pentafluoroprop-tetrafluoroeth,  phenol, senna-docusate, sodium chloride, traZODone  Physical Exam Pulmonary:     Effort: Pulmonary effort is normal.  Neurological:     Mental Status: She is alert.             Vital Signs: BP (!) 129/47 (BP Location: Right Arm)   Pulse 63   Temp 97.7 F (36.5 C) (Oral)   Resp 19   Ht 5' (1.524 m)   Wt 63 kg   SpO2 96%   BMI 27.13 kg/m  SpO2: SpO2: 96 % O2 Device: O2 Device: Nasal Cannula O2 Flow Rate: O2 Flow Rate (L/min): 4 L/min  Intake/output summary:  Intake/Output Summary (Last 24 hours) at 04/20/2023 1212 Last data filed at 04/20/2023 0900 Gross per 24 hour  Intake --  Output 1150 ml  Net -1150 ml   LBM: Last BM Date : 04/19/23 Baseline Weight: Weight: 56.2 kg Most recent weight: Weight: 63 kg         Patient Active Problem List   Diagnosis Date Noted   Acute urinary retention 04/04/2023   Community acquired pneumonia 04/03/2023   Hyperkalemia 04/03/2023   Pleural effusion 04/02/2023   Acute on chronic systolic CHF (congestive heart failure) (HCC) 03/31/2023   Paroxysmal atrial fibrillation (HCC) 03/31/2023   Hypokalemia 03/31/2023   Cardiogenic shock (HCC) 03/28/2023   COPD exacerbation (HCC) 03/25/2023   HLD (hyperlipidemia) 03/25/2023   Type II diabetes mellitus with renal manifestations (HCC) 03/25/2023   CAD (coronary artery disease) 03/25/2023   Iron deficiency anemia 03/25/2023   PAD (peripheral artery disease) (HCC) 03/25/2023   Acquired thrombophilia (HCC) 03/20/2021   Mood  disorder (HCC) 05/02/2020   Chronic heart failure with preserved ejection fraction (HCC) 04/12/2020   Diverticulosis of large intestine without perforation or abscess with bleeding 03/27/2020   COPD (chronic obstructive pulmonary disease) with chronic bronchitis    NSTEMI (non-ST elevated myocardial infarction) (HCC)    Acute respiratory failure with hypoxia (HCC) 02/18/2020   CKD (chronic kidney disease), stage IIIa 02/18/2020   Hyponatremia 02/18/2020   Malnutrition of mild degree (HCC) 01/08/2020   Acute kidney injury superimposed on CKD (HCC)    Age-related macular degeneration, dry, left eye 06/21/2019   Age-related macular degeneration, wet, right eye (HCC) 06/21/2019   Moderate nonproliferative diabetic retinopathy associated with type 2 diabetes mellitus (HCC) 06/21/2019   Lumbosacral radiculopathy at L4 05/01/2019   Underweight 05/01/2019   Encounter for long-term (current) use of aspirin 10/31/2018   Long term current use of oral hypoglycemic drug 10/31/2018   Encounter for long-term (current) use of insulin (HCC) 10/31/2018   Peptic ulcer disease 08/11/2018   Leg pain 07/03/2017   Carpal tunnel syndrome on both sides 05/05/2017   Myalgia due to HMG CoA reductase inhibitor 05/05/2017   History of CVA (cerebrovascular accident) 02/15/2017   Degenerative disc disease, lumbar 12/21/2016   Atherosclerosis of native arteries of extremity with intermittent claudication (HCC) 12/20/2016   Bilateral carotid artery stenosis 12/20/2016   Occlusion and stenosis of bilateral carotid arteries 12/20/2016   Hip bursitis 05/18/2016   Elevated TSH 01/18/2016   CKD stage 3 due to type 2 diabetes mellitus (HCC) 01/16/2016   Senile ecchymosis 01/16/2016   Type II diabetes mellitus with complication (HCC)    GERD (gastroesophageal reflux disease) 07/24/2015   PVD (peripheral vascular disease) (HCC) 05/17/2015   Neoplasm of uncertain behavior of skin 05/17/2015   Retinopathy, diabetic,  proliferative (HCC) 04/11/2015   Hyperlipidemia associated with type 2 diabetes mellitus (HCC) 04/11/2015   Essential  hypertension 04/11/2015   Generalized OA 04/11/2015   Proliferative diabetic retinopathy(362.02) 04/11/2015   Arteriosclerosis of coronary artery 05/29/2013   Hypertensive heart disease without CHF 05/29/2013    Palliative Care Assessment & Plan    Recommendations/Plan: Patient to be discharged home with hospice when medically optimized.   Code Status:    Code Status Orders  (From admission, onward)           Start     Ordered   03/31/23 1502  Do not attempt resuscitation (DNR)  Continuous       Question Answer Comment  If patient has no pulse and is not breathing Do Not Attempt Resuscitation   If patient has a pulse and/or is breathing: Medical Treatment Goals LIMITED ADDITIONAL INTERVENTIONS: Use medication/IV fluids and cardiac monitoring as indicated; Do not use intubation or mechanical ventilation (DNI), also provide comfort medications.  Transfer to Progressive/Stepdown as indicated, avoid Intensive Care.   Consent: Discussion documented in EHR or advanced directives reviewed      03/31/23 1501           Code Status History     Date Active Date Inactive Code Status Order ID Comments User Context   03/25/2023 1214 03/31/2023 1501 Full Code [id number]  Lorretta Harp, MD ED   02/18/2020 1059 02/26/2020 2310 Full Code [id number]  Lorretta Harp, MD ED   12/25/2019 0555 12/28/2019 2204 Full Code [id number]  Mansy, Vernetta Honey, MD ED   10/12/2018 1647 10/15/2018 1808 Full Code [id number]  Schnier, Latina Craver, MD Inpatient   09/16/2018 1254 09/16/2018 2048 Full Code [id number]  Gilda Crease, Latina Craver, MD Inpatient   08/11/2018 1706 08/13/2018 2136 Full Code [id number]  Bertrum Sol, MD Inpatient   07/26/2018 0921 07/26/2018 1652 Full Code [id number]  Renford Dills, MD Inpatient   08/23/2017 0935 08/23/2017 1419 Full Code [id number]  Annice Needy, MD Inpatient   02/15/2017 1538  02/16/2017 1959 Full Code [id number]  Auburn Bilberry, MD Inpatient       Prognosis:  < 6 months   Care plan was discussed with nephrology  Thank you for allowing the Palliative Medicine Team to assist in the care of this patient.   Morton Stall, NP  Please contact Palliative Medicine Team phone at [phone] for questions and concerns.

## 2023-04-21 ENCOUNTER — Telehealth: Payer: Self-pay | Admitting: *Deleted

## 2023-04-21 NOTE — Patient Outreach (Signed)
  Care Coordination   Follow Up Visit Note   04/21/2023 Name: Jill Hudson MRN: [id number] DOB: 1935-07-07  Jill Hudson is a 88 y.o. year old female who sees Reubin Milan, MD for primary care. I spoke with Jill Hudson, daughter of Jill Hudson by phone today.  What matters to the patients health and wellness today?  Transition to hospice for end of life care    Goals Addressed             This Visit's Progress    COMPLETED: RNCM: Effective Management of DM   On track    Care Coordination Interventions:  Provided education to patient about basic DM disease process.  Reviewed medications with patient and discussed importance of medication adherence.   Counseled on importance of regular laboratory monitoring as prescribed. The patient has regular lab work, last A1C was 7.1 Discussed plans with patient for ongoing care management follow up and provided patient with direct contact information for care management team Provided patient with written educational materials related to hypo and hyperglycemia and importance of correct treatment. Review and education.  Reviewed scheduled/upcoming provider appointments including: endocrinology changed to 5/6 and podiatry on 4/22.  Advised patient, providing education and rationale, to check [facility] QID and record, calling pcp or endocrinologist  for findings outside established parameters. Continuous reader.  Review of patient status, including review of consultants reports, relevant laboratory and other test results, and medications completed   Goal completed, patient now on hospice        SDOH assessments and interventions completed:  No     Care Coordination Interventions:  Yes, provided   Interventions Today    Flowsheet Row Most Recent Value  Chronic Disease   Chronic disease during today's visit Other  General Interventions   General Interventions Discussed/Reviewed General Interventions Reviewed  [Admitted to hospital 5/30-6/25,  discharged home with hospice.  Confirmed hospice has started, nurse currently in the home today]       Follow up plan: No further intervention required.   Encounter Outcome:  Pt. Visit Completed   Kemper Durie RN, MSN, [facility] Care Management Care Management Coordinator [phone]

## 2023-04-26 DEATH — deceased

## 2023-05-31 DIAGNOSIS — I5A Non-ischemic myocardial injury (non-traumatic): Secondary | ICD-10-CM

## 2023-06-10 ENCOUNTER — Encounter (INDEPENDENT_AMBULATORY_CARE_PROVIDER_SITE_OTHER): Payer: Medicare HMO

## 2023-06-10 ENCOUNTER — Ambulatory Visit (INDEPENDENT_AMBULATORY_CARE_PROVIDER_SITE_OTHER): Payer: Medicare HMO | Admitting: Vascular Surgery

## 2023-08-16 ENCOUNTER — Encounter (INDEPENDENT_AMBULATORY_CARE_PROVIDER_SITE_OTHER): Payer: Medicare HMO

## 2023-08-16 ENCOUNTER — Ambulatory Visit (INDEPENDENT_AMBULATORY_CARE_PROVIDER_SITE_OTHER): Payer: Medicare HMO | Admitting: Vascular Surgery
